# Patient Record
Sex: Male | Born: 1955
Health system: Southern US, Community
[De-identification: ages and names within clinical notes are randomized; demographics above are authoritative.]

## PROBLEM LIST (undated history)

## (undated) DIAGNOSIS — E7800 Pure hypercholesterolemia, unspecified: Secondary | ICD-10-CM

## (undated) DIAGNOSIS — Z8639 Personal history of other endocrine, nutritional and metabolic disease: Secondary | ICD-10-CM

## (undated) DIAGNOSIS — M5432 Sciatica, left side: Secondary | ICD-10-CM

## (undated) DIAGNOSIS — I10 Essential (primary) hypertension: Secondary | ICD-10-CM

## (undated) DIAGNOSIS — G4730 Sleep apnea, unspecified: Secondary | ICD-10-CM

## (undated) DIAGNOSIS — E119 Type 2 diabetes mellitus without complications: Secondary | ICD-10-CM

## (undated) DIAGNOSIS — I2510 Atherosclerotic heart disease of native coronary artery without angina pectoris: Secondary | ICD-10-CM

## (undated) DIAGNOSIS — I480 Paroxysmal atrial fibrillation: Secondary | ICD-10-CM

## (undated) DIAGNOSIS — Z21 Asymptomatic human immunodeficiency virus [HIV] infection status: Secondary | ICD-10-CM

## (undated) DIAGNOSIS — K625 Hemorrhage of anus and rectum: Secondary | ICD-10-CM

## (undated) DIAGNOSIS — I209 Angina pectoris, unspecified: Secondary | ICD-10-CM

## (undated) DIAGNOSIS — R011 Cardiac murmur, unspecified: Secondary | ICD-10-CM

## (undated) DIAGNOSIS — Z9114 Patient's other noncompliance with medication regimen: Secondary | ICD-10-CM

## (undated) DIAGNOSIS — Z9289 Personal history of other medical treatment: Secondary | ICD-10-CM

## (undated) DIAGNOSIS — C419 Malignant neoplasm of bone and articular cartilage, unspecified: Secondary | ICD-10-CM

## (undated) DIAGNOSIS — Z8719 Personal history of other diseases of the digestive system: Secondary | ICD-10-CM

## (undated) DIAGNOSIS — IMO0001 Reserved for inherently not codable concepts without codable children: Secondary | ICD-10-CM

## (undated) DIAGNOSIS — K819 Cholecystitis, unspecified: Secondary | ICD-10-CM

## (undated) DIAGNOSIS — I509 Heart failure, unspecified: Secondary | ICD-10-CM

## (undated) DIAGNOSIS — I059 Rheumatic mitral valve disease, unspecified: Secondary | ICD-10-CM

## (undated) DIAGNOSIS — F1011 Alcohol abuse, in remission: Secondary | ICD-10-CM

## (undated) DIAGNOSIS — Z86010 Personal history of colonic polyps: Secondary | ICD-10-CM

## (undated) DIAGNOSIS — F419 Anxiety disorder, unspecified: Secondary | ICD-10-CM

## (undated) DIAGNOSIS — B20 Human immunodeficiency virus [HIV] disease: Secondary | ICD-10-CM

## (undated) DIAGNOSIS — I499 Cardiac arrhythmia, unspecified: Secondary | ICD-10-CM

## (undated) DIAGNOSIS — Z8679 Personal history of other diseases of the circulatory system: Secondary | ICD-10-CM

## (undated) DIAGNOSIS — Z91148 Patient's other noncompliance with medication regimen for other reason: Secondary | ICD-10-CM

## (undated) DIAGNOSIS — B181 Chronic viral hepatitis B without delta-agent: Secondary | ICD-10-CM

## (undated) DIAGNOSIS — Z862 Personal history of diseases of the blood and blood-forming organs and certain disorders involving the immune mechanism: Secondary | ICD-10-CM

## (undated) HISTORY — DX: Patient's other noncompliance with medication regimen for other reason: Z91.148

## (undated) HISTORY — DX: Asymptomatic human immunodeficiency virus (hiv) infection status: Z21

## (undated) HISTORY — DX: Personal history of other medical treatment: Z92.89

## (undated) HISTORY — DX: Heart failure, unspecified: I50.9

## (undated) HISTORY — PX: A FLUTTER ABLATION: SHX5348

## (undated) HISTORY — DX: Cardiac arrhythmia, unspecified: I49.9

## (undated) HISTORY — DX: Paroxysmal atrial fibrillation: I48.0

## (undated) HISTORY — DX: Patient's other noncompliance with medication regimen: Z91.14

## (undated) HISTORY — DX: Human immunodeficiency virus (HIV) disease: B20

## (undated) HISTORY — DX: Alcohol abuse, in remission: F10.11

---
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
## 2000-07-05 ENCOUNTER — Inpatient Hospital Stay (HOSPITAL_COMMUNITY): Admission: EM | Admit: 2000-07-05 | Discharge: 2000-07-09 | Payer: Self-pay | Admitting: Emergency Medicine

## 2000-07-05 ENCOUNTER — Encounter: Payer: Self-pay | Admitting: Emergency Medicine

## 2001-05-12 ENCOUNTER — Ambulatory Visit (HOSPITAL_COMMUNITY): Admission: RE | Admit: 2001-05-12 | Discharge: 2001-05-12 | Payer: Self-pay | Admitting: Gastroenterology

## 2001-05-12 ENCOUNTER — Encounter (INDEPENDENT_AMBULATORY_CARE_PROVIDER_SITE_OTHER): Payer: Self-pay

## 2001-05-12 DIAGNOSIS — Z8719 Personal history of other diseases of the digestive system: Secondary | ICD-10-CM | POA: Insufficient documentation

## 2001-12-04 ENCOUNTER — Encounter: Payer: Self-pay | Admitting: Cardiology

## 2001-12-04 ENCOUNTER — Ambulatory Visit (HOSPITAL_COMMUNITY): Admission: RE | Admit: 2001-12-04 | Discharge: 2001-12-04 | Payer: Self-pay | Admitting: Cardiology

## 2001-12-05 ENCOUNTER — Ambulatory Visit (HOSPITAL_COMMUNITY): Admission: RE | Admit: 2001-12-05 | Discharge: 2001-12-05 | Payer: Self-pay | Admitting: Cardiology

## 2001-12-05 HISTORY — PX: CORONARY ANGIOPLASTY: SHX604

## 2003-11-03 ENCOUNTER — Encounter: Admission: RE | Admit: 2003-11-03 | Discharge: 2003-11-03 | Payer: Self-pay | Admitting: Internal Medicine

## 2003-11-04 ENCOUNTER — Emergency Department (HOSPITAL_COMMUNITY): Admission: EM | Admit: 2003-11-04 | Discharge: 2003-11-04 | Payer: Self-pay | Admitting: Emergency Medicine

## 2003-11-09 ENCOUNTER — Ambulatory Visit (HOSPITAL_COMMUNITY): Admission: RE | Admit: 2003-11-09 | Discharge: 2003-11-09 | Payer: Self-pay | Admitting: Cardiovascular Disease

## 2004-03-06 ENCOUNTER — Emergency Department (HOSPITAL_COMMUNITY): Admission: EM | Admit: 2004-03-06 | Discharge: 2004-03-07 | Payer: Self-pay | Admitting: Emergency Medicine

## 2004-03-08 ENCOUNTER — Emergency Department (HOSPITAL_COMMUNITY): Admission: EM | Admit: 2004-03-08 | Discharge: 2004-03-08 | Payer: Self-pay | Admitting: Emergency Medicine

## 2004-08-30 ENCOUNTER — Ambulatory Visit: Payer: Self-pay | Admitting: Cardiology

## 2004-09-04 ENCOUNTER — Ambulatory Visit: Payer: Self-pay | Admitting: Internal Medicine

## 2004-09-18 ENCOUNTER — Ambulatory Visit: Payer: Self-pay | Admitting: Cardiology

## 2004-09-26 ENCOUNTER — Ambulatory Visit: Payer: Self-pay | Admitting: Internal Medicine

## 2005-01-16 ENCOUNTER — Ambulatory Visit: Payer: Self-pay | Admitting: Internal Medicine

## 2005-02-15 ENCOUNTER — Ambulatory Visit: Payer: Self-pay | Admitting: Internal Medicine

## 2005-03-29 ENCOUNTER — Ambulatory Visit: Payer: Self-pay | Admitting: Internal Medicine

## 2005-05-17 ENCOUNTER — Ambulatory Visit: Payer: Self-pay | Admitting: Cardiology

## 2005-05-28 ENCOUNTER — Ambulatory Visit: Payer: Self-pay | Admitting: Internal Medicine

## 2005-06-11 ENCOUNTER — Ambulatory Visit: Payer: Self-pay

## 2005-08-13 DIAGNOSIS — Z8679 Personal history of other diseases of the circulatory system: Secondary | ICD-10-CM

## 2005-08-13 HISTORY — DX: Personal history of other diseases of the circulatory system: Z86.79

## 2005-09-18 ENCOUNTER — Ambulatory Visit: Payer: Self-pay | Admitting: Internal Medicine

## 2005-09-30 ENCOUNTER — Emergency Department (HOSPITAL_COMMUNITY): Admission: EM | Admit: 2005-09-30 | Discharge: 2005-09-30 | Payer: Self-pay | Admitting: Emergency Medicine

## 2005-10-02 ENCOUNTER — Ambulatory Visit: Payer: Self-pay | Admitting: Cardiology

## 2005-10-17 ENCOUNTER — Ambulatory Visit: Payer: Self-pay | Admitting: Internal Medicine

## 2005-10-18 ENCOUNTER — Ambulatory Visit: Payer: Self-pay | Admitting: Cardiology

## 2005-10-29 ENCOUNTER — Ambulatory Visit: Payer: Self-pay | Admitting: Internal Medicine

## 2005-11-06 ENCOUNTER — Ambulatory Visit: Payer: Self-pay | Admitting: Cardiology

## 2005-11-06 ENCOUNTER — Encounter: Payer: Self-pay | Admitting: Emergency Medicine

## 2005-11-06 ENCOUNTER — Inpatient Hospital Stay (HOSPITAL_COMMUNITY): Admission: AD | Admit: 2005-11-06 | Discharge: 2005-11-08 | Payer: Self-pay | Admitting: Cardiology

## 2005-11-13 ENCOUNTER — Ambulatory Visit: Payer: Self-pay | Admitting: Cardiology

## 2005-11-21 ENCOUNTER — Ambulatory Visit: Payer: Self-pay | Admitting: *Deleted

## 2005-12-07 ENCOUNTER — Ambulatory Visit: Payer: Self-pay | Admitting: Cardiology

## 2006-01-03 ENCOUNTER — Ambulatory Visit: Payer: Self-pay | Admitting: Cardiology

## 2006-01-08 ENCOUNTER — Ambulatory Visit: Payer: Self-pay | Admitting: Internal Medicine

## 2006-01-29 ENCOUNTER — Ambulatory Visit: Payer: Self-pay | Admitting: Internal Medicine

## 2006-01-30 ENCOUNTER — Ambulatory Visit (HOSPITAL_COMMUNITY): Admission: RE | Admit: 2006-01-30 | Discharge: 2006-01-30 | Payer: Self-pay | Admitting: Internal Medicine

## 2006-02-01 ENCOUNTER — Ambulatory Visit: Payer: Self-pay | Admitting: Internal Medicine

## 2006-02-01 ENCOUNTER — Observation Stay (HOSPITAL_COMMUNITY): Admission: RE | Admit: 2006-02-01 | Discharge: 2006-02-01 | Payer: Self-pay | Admitting: Internal Medicine

## 2006-07-03 ENCOUNTER — Ambulatory Visit: Payer: Self-pay | Admitting: Cardiology

## 2006-07-24 ENCOUNTER — Ambulatory Visit: Payer: Self-pay | Admitting: Internal Medicine

## 2006-12-17 ENCOUNTER — Ambulatory Visit: Payer: Self-pay | Admitting: Cardiology

## 2006-12-17 ENCOUNTER — Inpatient Hospital Stay (HOSPITAL_COMMUNITY): Admission: EM | Admit: 2006-12-17 | Discharge: 2006-12-22 | Payer: Self-pay | Admitting: Emergency Medicine

## 2006-12-18 ENCOUNTER — Encounter: Payer: Self-pay | Admitting: Cardiology

## 2006-12-20 ENCOUNTER — Encounter: Payer: Self-pay | Admitting: Cardiology

## 2006-12-30 DIAGNOSIS — Z860100 Personal history of colon polyps, unspecified: Secondary | ICD-10-CM | POA: Insufficient documentation

## 2006-12-30 DIAGNOSIS — Z862 Personal history of diseases of the blood and blood-forming organs and certain disorders involving the immune mechanism: Secondary | ICD-10-CM

## 2006-12-30 DIAGNOSIS — I4892 Unspecified atrial flutter: Secondary | ICD-10-CM | POA: Insufficient documentation

## 2006-12-30 DIAGNOSIS — I509 Heart failure, unspecified: Secondary | ICD-10-CM | POA: Insufficient documentation

## 2006-12-30 DIAGNOSIS — I059 Rheumatic mitral valve disease, unspecified: Secondary | ICD-10-CM

## 2006-12-30 DIAGNOSIS — B181 Chronic viral hepatitis B without delta-agent: Secondary | ICD-10-CM | POA: Insufficient documentation

## 2006-12-30 DIAGNOSIS — Z8639 Personal history of other endocrine, nutritional and metabolic disease: Secondary | ICD-10-CM

## 2006-12-30 DIAGNOSIS — D169 Benign neoplasm of bone and articular cartilage, unspecified: Secondary | ICD-10-CM | POA: Insufficient documentation

## 2006-12-30 HISTORY — DX: Personal history of colonic polyps: Z86.010

## 2006-12-30 HISTORY — DX: Chronic viral hepatitis B without delta-agent: B18.1

## 2006-12-30 HISTORY — DX: Personal history of diseases of the blood and blood-forming organs and certain disorders involving the immune mechanism: Z86.39

## 2006-12-30 HISTORY — DX: Rheumatic mitral valve disease, unspecified: I05.9

## 2006-12-30 HISTORY — DX: Personal history of diseases of the blood and blood-forming organs and certain disorders involving the immune mechanism: Z86.2

## 2006-12-30 HISTORY — DX: Personal history of colon polyps, unspecified: Z86.0100

## 2007-01-11 ENCOUNTER — Emergency Department (HOSPITAL_COMMUNITY): Admission: EM | Admit: 2007-01-11 | Discharge: 2007-01-11 | Payer: Self-pay | Admitting: Emergency Medicine

## 2007-01-16 ENCOUNTER — Ambulatory Visit: Payer: Self-pay | Admitting: Cardiovascular Disease

## 2007-01-16 ENCOUNTER — Ambulatory Visit: Payer: Self-pay | Admitting: Internal Medicine

## 2007-01-24 ENCOUNTER — Ambulatory Visit: Payer: Self-pay | Admitting: Cardiovascular Disease

## 2007-02-03 ENCOUNTER — Ambulatory Visit: Payer: Self-pay | Admitting: Internal Medicine

## 2007-02-03 ENCOUNTER — Ambulatory Visit: Payer: Self-pay | Admitting: Cardiovascular Disease

## 2007-02-03 LAB — CONVERTED CEMR LAB
BUN: 10 mg/dL (ref 6–23)
Basophils Absolute: 0 10*3/uL (ref 0.0–0.1)
Basophils Relative: 0.6 % (ref 0.0–1.0)
Calcium: 9.3 mg/dL (ref 8.4–10.5)
Eosinophils Absolute: 0.2 10*3/uL (ref 0.0–0.6)
Eosinophils Relative: 2.7 % (ref 0.0–5.0)
GFR calc Af Amer: 75 mL/min
INR: 3.9 — ABNORMAL HIGH (ref 0.9–2.0)
MCHC: 32.8 g/dL (ref 30.0–36.0)
Magnesium: 2.2 mg/dL (ref 1.5–2.5)
Monocytes Absolute: 0.6 10*3/uL (ref 0.2–0.7)
Monocytes Relative: 8.5 % (ref 3.0–11.0)
Neutro Abs: 3.5 10*3/uL (ref 1.4–7.7)
Neutrophils Relative %: 46.5 % (ref 43.0–77.0)
RDW: 14.5 % (ref 11.5–14.6)

## 2007-02-07 ENCOUNTER — Ambulatory Visit (HOSPITAL_COMMUNITY): Admission: RE | Admit: 2007-02-07 | Discharge: 2007-02-07 | Payer: Self-pay | Admitting: Internal Medicine

## 2007-02-07 ENCOUNTER — Ambulatory Visit: Payer: Self-pay | Admitting: Internal Medicine

## 2007-02-10 ENCOUNTER — Ambulatory Visit: Payer: Self-pay | Admitting: Cardiology

## 2007-03-25 ENCOUNTER — Ambulatory Visit: Payer: Self-pay | Admitting: Cardiology

## 2007-04-07 ENCOUNTER — Ambulatory Visit: Payer: Self-pay | Admitting: Cardiology

## 2007-04-21 ENCOUNTER — Ambulatory Visit: Payer: Self-pay | Admitting: Cardiology

## 2007-06-13 ENCOUNTER — Encounter (INDEPENDENT_AMBULATORY_CARE_PROVIDER_SITE_OTHER): Payer: Self-pay | Admitting: *Deleted

## 2007-06-26 ENCOUNTER — Ambulatory Visit: Payer: Self-pay | Admitting: Cardiology

## 2007-07-17 ENCOUNTER — Ambulatory Visit: Payer: Self-pay | Admitting: Internal Medicine

## 2007-09-22 ENCOUNTER — Ambulatory Visit: Payer: Self-pay | Admitting: Internal Medicine

## 2007-10-10 ENCOUNTER — Ambulatory Visit: Payer: Self-pay | Admitting: Cardiology

## 2007-10-24 ENCOUNTER — Ambulatory Visit: Payer: Self-pay | Admitting: Internal Medicine

## 2007-11-14 ENCOUNTER — Ambulatory Visit: Payer: Self-pay | Admitting: Internal Medicine

## 2008-02-16 ENCOUNTER — Ambulatory Visit: Payer: Self-pay | Admitting: Internal Medicine

## 2008-04-08 ENCOUNTER — Telehealth (INDEPENDENT_AMBULATORY_CARE_PROVIDER_SITE_OTHER): Payer: Self-pay | Admitting: *Deleted

## 2008-04-09 ENCOUNTER — Ambulatory Visit: Payer: Self-pay | Admitting: Internal Medicine

## 2008-04-09 ENCOUNTER — Encounter (INDEPENDENT_AMBULATORY_CARE_PROVIDER_SITE_OTHER): Payer: Self-pay | Admitting: *Deleted

## 2008-04-09 ENCOUNTER — Ambulatory Visit: Payer: Self-pay | Admitting: Cardiology

## 2008-05-04 ENCOUNTER — Ambulatory Visit: Payer: Self-pay | Admitting: Internal Medicine

## 2008-05-04 LAB — CONVERTED CEMR LAB
BUN: 11 mg/dL (ref 6–23)
CO2: 32 meq/L (ref 19–32)
Calcium: 9.4 mg/dL (ref 8.4–10.5)
Chloride: 107 meq/L (ref 96–112)
Creatinine, Ser: 1.2 mg/dL (ref 0.4–1.5)
GFR calc Af Amer: 81 mL/min
GFR calc non Af Amer: 67 mL/min
Glucose, Bld: 129 mg/dL — ABNORMAL HIGH (ref 70–99)
Potassium: 4.6 meq/L (ref 3.5–5.1)
Sed Rate: 32 mm/hr — ABNORMAL HIGH (ref 0–16)

## 2008-07-09 ENCOUNTER — Ambulatory Visit: Payer: Self-pay | Admitting: Internal Medicine

## 2008-07-23 ENCOUNTER — Ambulatory Visit: Payer: Self-pay | Admitting: Internal Medicine

## 2008-07-23 ENCOUNTER — Ambulatory Visit: Payer: Self-pay | Admitting: Cardiology

## 2008-08-26 ENCOUNTER — Ambulatory Visit: Payer: Self-pay | Admitting: Internal Medicine

## 2008-08-26 ENCOUNTER — Inpatient Hospital Stay (HOSPITAL_COMMUNITY): Admission: AD | Admit: 2008-08-26 | Discharge: 2008-08-29 | Payer: Self-pay | Admitting: Internal Medicine

## 2008-11-22 ENCOUNTER — Ambulatory Visit: Payer: Self-pay | Admitting: Internal Medicine

## 2008-12-27 ENCOUNTER — Ambulatory Visit: Payer: Self-pay | Admitting: Internal Medicine

## 2009-01-24 ENCOUNTER — Ambulatory Visit: Payer: Self-pay | Admitting: Cardiology

## 2009-01-24 LAB — CONVERTED CEMR LAB: POC INR: 1.4

## 2009-02-16 ENCOUNTER — Encounter: Payer: Self-pay | Admitting: *Deleted

## 2009-03-06 ENCOUNTER — Ambulatory Visit: Payer: Self-pay | Admitting: Internal Medicine

## 2009-03-06 ENCOUNTER — Inpatient Hospital Stay (HOSPITAL_COMMUNITY): Admission: EM | Admit: 2009-03-06 | Discharge: 2009-03-09 | Payer: Self-pay | Admitting: Emergency Medicine

## 2009-03-18 ENCOUNTER — Telehealth: Payer: Self-pay | Admitting: Internal Medicine

## 2009-03-21 ENCOUNTER — Encounter (INDEPENDENT_AMBULATORY_CARE_PROVIDER_SITE_OTHER): Payer: Self-pay | Admitting: *Deleted

## 2009-03-23 ENCOUNTER — Encounter (INDEPENDENT_AMBULATORY_CARE_PROVIDER_SITE_OTHER): Payer: Self-pay | Admitting: *Deleted

## 2009-03-23 ENCOUNTER — Encounter: Payer: Self-pay | Admitting: Cardiology

## 2009-04-07 ENCOUNTER — Ambulatory Visit: Payer: Self-pay | Admitting: Nurse Practitioner

## 2009-04-08 ENCOUNTER — Encounter: Payer: Self-pay | Admitting: Nurse Practitioner

## 2009-04-08 LAB — CONVERTED CEMR LAB
ALT: 16 units/L (ref 0–53)
Basophils Relative: 1 % (ref 0–1)
Chloride: 100 meq/L (ref 96–112)
Digitoxin Lvl: 0.5 ng/mL — ABNORMAL LOW (ref 0.8–2.0)
Eosinophils Absolute: 0.2 10*3/uL (ref 0.0–0.7)
Eosinophils Relative: 5 % (ref 0–5)
Glucose, Bld: 91 mg/dL (ref 70–99)
Hemoglobin: 13.6 g/dL (ref 13.0–17.0)
INR: 1.5 (ref 0.0–1.5)
MCHC: 33 g/dL (ref 30.0–36.0)
MCV: 80 fL (ref 78.0–100.0)
Neutro Abs: 2.2 10*3/uL (ref 1.7–7.7)
Platelets: 179 10*3/uL (ref 150–400)
Prothrombin Time: 17.5 s — ABNORMAL HIGH (ref 11.6–15.2)
RBC: 5.15 M/uL (ref 4.22–5.81)
RDW: 14.5 % (ref 11.5–15.5)
Sodium: 138 meq/L (ref 135–145)
TSH: 1.447 microintl units/mL (ref 0.350–4.500)
WBC: 5.3 10*3/uL (ref 4.0–10.5)

## 2009-04-26 ENCOUNTER — Ambulatory Visit: Payer: Self-pay | Admitting: Nurse Practitioner

## 2009-04-28 ENCOUNTER — Ambulatory Visit: Payer: Self-pay | Admitting: Internal Medicine

## 2009-04-29 ENCOUNTER — Encounter (INDEPENDENT_AMBULATORY_CARE_PROVIDER_SITE_OTHER): Payer: Self-pay | Admitting: *Deleted

## 2009-05-05 ENCOUNTER — Ambulatory Visit: Payer: Self-pay | Admitting: Nurse Practitioner

## 2009-05-05 DIAGNOSIS — M109 Gout, unspecified: Secondary | ICD-10-CM | POA: Insufficient documentation

## 2009-05-06 ENCOUNTER — Telehealth (INDEPENDENT_AMBULATORY_CARE_PROVIDER_SITE_OTHER): Payer: Self-pay | Admitting: Nurse Practitioner

## 2009-05-09 ENCOUNTER — Encounter: Payer: Self-pay | Admitting: Cardiology

## 2009-05-09 ENCOUNTER — Ambulatory Visit: Payer: Self-pay | Admitting: *Deleted

## 2009-05-09 ENCOUNTER — Telehealth: Payer: Self-pay | Admitting: Cardiology

## 2009-05-09 LAB — CONVERTED CEMR LAB: Prothrombin Time: 19.8 s

## 2009-05-10 ENCOUNTER — Ambulatory Visit: Payer: Self-pay | Admitting: Nurse Practitioner

## 2009-05-11 ENCOUNTER — Encounter (INDEPENDENT_AMBULATORY_CARE_PROVIDER_SITE_OTHER): Payer: Self-pay | Admitting: Nurse Practitioner

## 2009-05-30 ENCOUNTER — Encounter (INDEPENDENT_AMBULATORY_CARE_PROVIDER_SITE_OTHER): Payer: Self-pay | Admitting: Nurse Practitioner

## 2009-05-30 ENCOUNTER — Telehealth (INDEPENDENT_AMBULATORY_CARE_PROVIDER_SITE_OTHER): Payer: Self-pay | Admitting: Nurse Practitioner

## 2009-06-02 ENCOUNTER — Ambulatory Visit: Payer: Self-pay | Admitting: Cardiovascular Disease

## 2009-06-16 ENCOUNTER — Encounter (INDEPENDENT_AMBULATORY_CARE_PROVIDER_SITE_OTHER): Payer: Self-pay | Admitting: *Deleted

## 2009-08-06 ENCOUNTER — Emergency Department (HOSPITAL_COMMUNITY): Admission: EM | Admit: 2009-08-06 | Discharge: 2009-08-06 | Payer: Self-pay | Admitting: Emergency Medicine

## 2009-09-02 ENCOUNTER — Ambulatory Visit: Payer: Self-pay | Admitting: Cardiovascular Disease

## 2009-09-02 LAB — CONVERTED CEMR LAB: POC INR: 1.9

## 2009-09-23 ENCOUNTER — Ambulatory Visit: Payer: Self-pay | Admitting: Internal Medicine

## 2009-12-05 ENCOUNTER — Emergency Department (HOSPITAL_COMMUNITY): Admission: EM | Admit: 2009-12-05 | Discharge: 2009-12-05 | Payer: Self-pay | Admitting: Emergency Medicine

## 2009-12-05 ENCOUNTER — Telehealth (INDEPENDENT_AMBULATORY_CARE_PROVIDER_SITE_OTHER): Payer: Self-pay | Admitting: Nurse Practitioner

## 2009-12-28 ENCOUNTER — Ambulatory Visit: Payer: Self-pay | Admitting: Nurse Practitioner

## 2009-12-29 LAB — CONVERTED CEMR LAB
ALT: 12 units/L (ref 0–53)
Alkaline Phosphatase: 69 units/L (ref 39–117)
Basophils Relative: 0 % (ref 0–1)
Calcium: 9.2 mg/dL (ref 8.4–10.5)
Chloride: 102 meq/L (ref 96–112)
Creatinine, Ser: 1.22 mg/dL (ref 0.40–1.50)
Eosinophils Absolute: 0.2 10*3/uL (ref 0.0–0.7)
Eosinophils Relative: 4 % (ref 0–5)
HCT: 44.7 % (ref 39.0–52.0)
Hemoglobin: 15.2 g/dL (ref 13.0–17.0)
INR: 1.74 — ABNORMAL HIGH (ref <1.50)
Lymphocytes Relative: 44 % (ref 12–46)
Lymphs Abs: 2.5 10*3/uL (ref 0.7–4.0)
MCV: 80.7 fL (ref 78.0–100.0)
Monocytes Absolute: 0.4 10*3/uL (ref 0.1–1.0)
Monocytes Relative: 7 % (ref 3–12)
Neutro Abs: 2.6 10*3/uL (ref 1.7–7.7)
Neutrophils Relative %: 45 % (ref 43–77)
Platelets: 223 10*3/uL (ref 150–400)
Potassium: 3.7 meq/L (ref 3.5–5.3)
RBC: 5.54 M/uL (ref 4.22–5.81)
RDW: 14.2 % (ref 11.5–15.5)
Sodium: 141 meq/L (ref 135–145)
Total Bilirubin: 0.5 mg/dL (ref 0.3–1.2)
Total Protein: 8.2 g/dL (ref 6.0–8.3)
Uric Acid, Serum: 6.4 mg/dL (ref 4.0–7.8)

## 2009-12-30 ENCOUNTER — Ambulatory Visit: Payer: Self-pay | Admitting: Internal Medicine

## 2009-12-30 ENCOUNTER — Encounter (INDEPENDENT_AMBULATORY_CARE_PROVIDER_SITE_OTHER): Payer: Self-pay | Admitting: Nurse Practitioner

## 2010-01-03 ENCOUNTER — Encounter (INDEPENDENT_AMBULATORY_CARE_PROVIDER_SITE_OTHER): Payer: Self-pay | Admitting: Nurse Practitioner

## 2010-01-03 LAB — CONVERTED CEMR LAB
Digitoxin Lvl: 0.5 ng/mL — ABNORMAL LOW (ref 0.8–2.0)
Hgb A1c MFr Bld: 7.6 % — ABNORMAL HIGH (ref <5.7)

## 2010-01-04 ENCOUNTER — Encounter (INDEPENDENT_AMBULATORY_CARE_PROVIDER_SITE_OTHER): Payer: Self-pay | Admitting: Nurse Practitioner

## 2010-01-10 ENCOUNTER — Telehealth (INDEPENDENT_AMBULATORY_CARE_PROVIDER_SITE_OTHER): Payer: Self-pay | Admitting: Nurse Practitioner

## 2010-01-13 ENCOUNTER — Ambulatory Visit: Payer: Self-pay | Admitting: Cardiovascular Disease

## 2010-01-30 ENCOUNTER — Emergency Department (HOSPITAL_COMMUNITY): Admission: EM | Admit: 2010-01-30 | Discharge: 2010-01-30 | Payer: Self-pay | Admitting: Emergency Medicine

## 2010-02-20 ENCOUNTER — Ambulatory Visit: Payer: Self-pay | Admitting: Internal Medicine

## 2010-03-06 ENCOUNTER — Ambulatory Visit: Payer: Self-pay | Admitting: Cardiology

## 2010-03-30 ENCOUNTER — Ambulatory Visit: Payer: Self-pay | Admitting: Nurse Practitioner

## 2010-03-30 LAB — CONVERTED CEMR LAB
Blood Glucose, Fingerstick: 159
Microalb, Ur: 1.52 mg/dL (ref 0.00–1.89)
Nitrite: NEGATIVE
PSA: 0.91 ng/mL (ref 0.10–4.00)
Protein, U semiquant: NEGATIVE
Urobilinogen, UA: 0.2

## 2010-03-31 ENCOUNTER — Encounter (INDEPENDENT_AMBULATORY_CARE_PROVIDER_SITE_OTHER): Payer: Self-pay | Admitting: Nurse Practitioner

## 2010-03-31 ENCOUNTER — Ambulatory Visit: Payer: Self-pay | Admitting: Cardiology

## 2010-04-05 ENCOUNTER — Emergency Department (HOSPITAL_COMMUNITY): Admission: EM | Admit: 2010-04-05 | Discharge: 2010-04-05 | Payer: Self-pay | Admitting: Emergency Medicine

## 2010-04-14 ENCOUNTER — Ambulatory Visit: Payer: Self-pay | Admitting: Cardiology

## 2010-05-15 ENCOUNTER — Ambulatory Visit: Payer: Self-pay | Admitting: Internal Medicine

## 2010-05-29 ENCOUNTER — Ambulatory Visit: Payer: Self-pay | Admitting: Cardiology

## 2010-06-08 ENCOUNTER — Ambulatory Visit: Payer: Self-pay | Admitting: Vascular Surgery

## 2010-06-08 ENCOUNTER — Encounter (INDEPENDENT_AMBULATORY_CARE_PROVIDER_SITE_OTHER): Payer: Self-pay | Admitting: Internal Medicine

## 2010-06-08 ENCOUNTER — Inpatient Hospital Stay (HOSPITAL_COMMUNITY): Admission: EM | Admit: 2010-06-08 | Discharge: 2010-06-09 | Payer: Self-pay | Admitting: Emergency Medicine

## 2010-06-26 ENCOUNTER — Ambulatory Visit: Payer: Self-pay | Admitting: Internal Medicine

## 2010-06-30 ENCOUNTER — Ambulatory Visit: Payer: Self-pay | Admitting: Nurse Practitioner

## 2010-06-30 DIAGNOSIS — K029 Dental caries, unspecified: Secondary | ICD-10-CM | POA: Insufficient documentation

## 2010-06-30 LAB — CONVERTED CEMR LAB
Blood Glucose, Fingerstick: 167
Hgb A1c MFr Bld: 7.1 %

## 2010-07-03 ENCOUNTER — Encounter (INDEPENDENT_AMBULATORY_CARE_PROVIDER_SITE_OTHER): Payer: Self-pay | Admitting: Nurse Practitioner

## 2010-07-10 ENCOUNTER — Ambulatory Visit: Payer: Self-pay | Admitting: Nurse Practitioner

## 2010-07-11 DIAGNOSIS — E7800 Pure hypercholesterolemia, unspecified: Secondary | ICD-10-CM

## 2010-07-11 HISTORY — DX: Pure hypercholesterolemia, unspecified: E78.00

## 2010-07-17 ENCOUNTER — Encounter (INDEPENDENT_AMBULATORY_CARE_PROVIDER_SITE_OTHER): Payer: Self-pay | Admitting: Internal Medicine

## 2010-07-17 LAB — CONVERTED CEMR LAB
ALT: 10 units/L (ref 0–53)
AST: 13 units/L (ref 0–37)
Albumin: 4.1 g/dL (ref 3.5–5.2)
Calcium: 9.7 mg/dL (ref 8.4–10.5)
Chloride: 102 meq/L (ref 96–112)
Creatinine, Ser: 1.36 mg/dL (ref 0.40–1.50)
Potassium: 4.2 meq/L (ref 3.5–5.3)
Sodium: 140 meq/L (ref 135–145)
Total CHOL/HDL Ratio: 4.7

## 2010-07-24 ENCOUNTER — Ambulatory Visit: Payer: Self-pay | Admitting: Cardiology

## 2010-08-24 ENCOUNTER — Telehealth (INDEPENDENT_AMBULATORY_CARE_PROVIDER_SITE_OTHER): Payer: Self-pay | Admitting: Nurse Practitioner

## 2010-08-24 ENCOUNTER — Other Ambulatory Visit: Payer: Self-pay | Admitting: Internal Medicine

## 2010-08-24 ENCOUNTER — Ambulatory Visit
Admission: RE | Admit: 2010-08-24 | Discharge: 2010-08-24 | Payer: Self-pay | Source: Home / Self Care | Attending: Internal Medicine | Admitting: Internal Medicine

## 2010-08-24 ENCOUNTER — Encounter: Payer: Self-pay | Admitting: Internal Medicine

## 2010-08-24 ENCOUNTER — Ambulatory Visit: Admission: RE | Admit: 2010-08-24 | Discharge: 2010-08-24 | Payer: Self-pay | Source: Home / Self Care

## 2010-08-24 LAB — BASIC METABOLIC PANEL
BUN: 18 mg/dL (ref 6–23)
CO2: 28 mEq/L (ref 19–32)
Calcium: 9.7 mg/dL (ref 8.4–10.5)
Chloride: 90 mEq/L — ABNORMAL LOW (ref 96–112)
Creatinine, Ser: 1.3 mg/dL (ref 0.4–1.5)
GFR: 73.33 mL/min (ref >60.00)
Glucose, Bld: 770 mg/dL (ref 70–99)
Potassium: 5.1 mEq/L (ref 3.5–5.1)
Sodium: 125 mEq/L — ABNORMAL LOW (ref 135–145)

## 2010-08-24 LAB — CONVERTED CEMR LAB: POC INR: 1.5

## 2010-08-31 ENCOUNTER — Encounter (INDEPENDENT_AMBULATORY_CARE_PROVIDER_SITE_OTHER): Payer: Self-pay | Admitting: Nurse Practitioner

## 2010-09-02 ENCOUNTER — Encounter: Payer: Self-pay | Admitting: Internal Medicine

## 2010-09-07 ENCOUNTER — Ambulatory Visit: Admit: 2010-09-07 | Payer: Self-pay

## 2010-09-10 LAB — CONVERTED CEMR LAB
Blood Glucose, Fingerstick: 131
Cholesterol: 201 mg/dL — ABNORMAL HIGH (ref 0–200)
HDL: 50 mg/dL (ref >39)
INR: 1.7 — ABNORMAL HIGH (ref 0.0–1.5)
Triglycerides: 90 mg/dL (ref <150)
VLDL: 18 mg/dL (ref 0–40)

## 2010-09-12 NOTE — Medication Information (Signed)
Summary: rov/tm  Anticoagulant Therapy  Managed by: Weston Brass, PharmD Supervising MD: Tenny Craw MD, Gunnar Fusi Indication 1: Atrial Fibrillation (chronic) Lab Used: [facility] Site: [facility] INR POC 2.3 INR RANGE 2.0-3.0  Dietary changes: no    Health status changes: no    Bleeding/hemorrhagic complications: no    Recent/future hospitalizations: no    Any changes in medication regimen? yes       Details: had injection for gout last week; on colchicine and increased allopurinol  Recent/future dental: no  Any missed doses?: no       Is patient compliant with meds? yes       Allergies: 1)  ! * Some Kind of Antibx  Anticoagulation Management History:      The patient is taking warfarin and comes in today for a routine follow up visit.  Positive risk factors for bleeding include presence of serious comorbidities.  Negative risk factors for bleeding include an age less than 66 years old, no history of CVA/TIA, and no history of GI bleeding.  The bleeding index is 'intermediate risk'.  Positive CHADS2 values include History of CHF, History of HTN, and History of Diabetes.  Negative CHADS2 values include Age > 89 years old and Prior Stroke/CVA/TIA.  His last INR was 1.74.  Anticoagulation responsible provider: Tenny Craw MD, Gunnar Fusi.  INR POC: 2.3.  Cuvette Lot#: [id number].  Exp: 03/2011.    Anticoagulation Management Assessment/Plan:      The patient's current anticoagulation dose is Warfarin sodium 2.5 mg tabs: Take as directed by coumadin clinic., Warfarin sodium 5 mg tabs: Take as directed by coumadin clinic..  The target INR is 2 - 3.  The next INR is due 01/13/2010.  Anticoagulation instructions were given to patient.  Results were reviewed/authorized by Weston Brass, PharmD.  He was notified by Weston Brass PharmD.         Prior Anticoagulation Instructions: INR 1.3 Today that 7.5mg s and Saturday take 5mg s then resume 5mg  everyday except 2.5mg s on Tuesdays, Thursdays and Saturdays.  Recheck in 10 days.   Current Anticoagulation Instructions: INR 2.3  Continue same dose of 5mg  every day except 2.5mg  on Tuesday, Thursday and Saturday  Prescriptions: WARFARIN SODIUM 5 MG TABS (WARFARIN SODIUM) Take as directed by coumadin clinic.  #20 x 0   Entered by:   Weston Brass PharmD   Authorized by:   Laren Boom, MD, [facility]   Signed by:   Weston Brass PharmD on 12/30/2009   Method used:   Electronically to        [facility] (retail)       [address]       [place], [place]  [address]       Ph: [phone]       Fax: [phone]   RxID:   [id number] WARFARIN SODIUM 2.5 MG TABS (WARFARIN SODIUM) Take as directed by coumadin clinic.  #20 x 0   Entered by:   Weston Brass PharmD   Authorized by:   Laren Boom, MD, [facility]   Signed by:   Weston Brass PharmD on 12/30/2009   Method used:   Electronically to        [facility] (retail)       [address]       [place], [place]  [address]       Ph: [phone]  Fax: [phone]   RxID:   [id number]

## 2010-09-12 NOTE — Letter (Signed)
Summary: *HSN Results Follow up  [facility]  [address] [place], [place] [address]   Phone: [phone]  Fax: [phone]      03/31/2010   [facility] [address] [place], [place]  [address]   Dear  Mr. Hancel Slaugh,                            ____S.Drinkard,FNP   ____D. Gore,FNP       ____B. McPherson,MD   ____V. Rankins,MD    ____E. Mulberry,MD    __X__N. Daphine Deutscher, FNP  ____D. Reche Dixon, MD    ____K. Philipp Deputy, MD    ____Other     This letter is to inform you that your recent test(s):  _______Pap Smear    ___X____Lab Test     _______X-ray    ___X____ is within acceptable limits  _______ requires a medication change  _______ requires a follow-up lab visit  _______ requires a follow-up visit with your provider   Comments: Labs done during recent office visit are normal.       _________________________________________________________ If you have any questions, please contact our office [phone].                    Sincerely,    Lehman Prom FNP [facility]

## 2010-09-12 NOTE — Medication Information (Signed)
Summary: [name] [name]  Anticoagulant Therapy  Managed by: Shelby Dubin, PharmD, BCPS, CPP Supervising MD: Eden Emms MD, Theron Arista Indication 1: Atrial Fibrillation (chronic) Lab Used: [facility] Site: [facility] INR POC 1.9 INR RANGE 2.0-3.0  Dietary changes: no    Health status changes: no    Bleeding/hemorrhagic complications: no    Recent/future hospitalizations: no    Any changes in medication regimen? no    Recent/future dental: no  Any missed doses?: yes     Details: missed about a week and 1/2 doses then went back on it 2 days ago  Is patient compliant with meds? yes      Comments: Patient took 7.5mg  on Wednesday, 5 mg on Thursday and Friday  Allergies (verified): 1)  ! * Some Kind of Antibx  Anticoagulation Management History:      The patient is taking warfarin and comes in today for a routine follow up visit.  Positive risk factors for bleeding include presence of serious comorbidities.  Negative risk factors for bleeding include an age less than 103 years old, no history of CVA/TIA, and no history of GI bleeding.  The bleeding index is 'intermediate risk'.  Positive CHADS2 values include History of CHF, History of HTN, and History of Diabetes.  Negative CHADS2 values include Age > 15 years old and Prior Stroke/CVA/TIA.  His last INR was 1.7.  Anticoagulation responsible provider: Eden Emms MD, Theron Arista.  INR POC: 1.9.  Cuvette Lot#: [id number].  Exp: 05/2010.    Anticoagulation Management Assessment/Plan:      The patient's current anticoagulation dose is Warfarin sodium 2.5 mg tabs: Take as directed by coumadin clinic., Warfarin sodium 5 mg tabs: Take as directed by coumadin clinic..  The target INR is 2 - 3.  The next INR is due 09/16/2009.  Anticoagulation instructions were given to patient.  Results were reviewed/authorized by Shelby Dubin, PharmD, BCPS, CPP.  He was notified by Ysidro Evert, Pharm D Candidate.         Prior Anticoagulation Instructions: INR  2.1  Continue current dosing regimen of 2.5 mg on Tuesday, Thursday, and Saturday, and 5 mg all other days.  Return to clinic in 3 weeks.  Current Anticoagulation Instructions: INR: 1.9 Take extra 2.5mg  today then resume to 5mg  tablet daily except 2.5mg  on Tuesdays, Thursdays, Saturdays Recheck in 2 weeeks Prescriptions: WARFARIN SODIUM 2.5 MG TABS (WARFARIN SODIUM) Take as directed by coumadin clinic.  #20 x 0   Entered by:   Cloyde Reams RN   Authorized by:   Laren Boom, MD, [facility]   Signed by:   Cloyde Reams RN on 09/02/2009   Method used:   Electronically to        [facility] (retail)       [address]       [place], [place]  [address]       Ph: [phone]       Fax: [phone]   RxID:   [id number] WARFARIN SODIUM 5 MG TABS (WARFARIN SODIUM) Take as directed by coumadin clinic.  #30 x 0   Entered by:   Cloyde Reams RN   Authorized by:   Laren Boom, MD, [facility]   Signed by:   Cloyde Reams RN on 09/02/2009   Method used:   Electronically to        [facility] (retail)       [address]  [place], [place]  [address]       Ph: [phone]       Fax: [phone]   RxID:   [id number]

## 2010-09-12 NOTE — Medication Information (Signed)
Summary: rov/sp  Anticoagulant Therapy  Managed by: Weston Brass, PharmD Supervising MD: Daleen Squibb MD, Maisie Fus Indication 1: Atrial Fibrillation (chronic) Lab Used: [facility] [place] Site: [address] INR POC 2.7 INR RANGE 2.0-3.0  Dietary changes: no     Bleeding/hemorrhagic complications: no    Recent/future hospitalizations: yes       Details: ER visit last week for stomach problems  Any changes in medication regimen? no    Recent/future dental: no  Any missed doses?: no         Allergies: 1)  ! * Some Kind of Antibx  Anticoagulation Management History:      Positive risk factors for bleeding include presence of serious comorbidities.  Negative risk factors for bleeding include an age less than 16 years old, no history of CVA/TIA, and no history of GI bleeding.  The bleeding index is 'intermediate risk'.  Positive CHADS2 values include History of CHF, History of HTN, and History of Diabetes.  Negative CHADS2 values include Age > 15 years old and Prior Stroke/CVA/TIA.  His last INR was 1.74.  Anticoagulation responsible provider: Daleen Squibb MD, Maisie Fus.  INR POC: 2.7.  Cuvette Lot#: [id number].  Exp: 05/2011.    Anticoagulation Management Assessment/Plan:      The patient's current anticoagulation dose is Warfarin sodium 5 mg tabs: Take as directed by coumadin clinic..  The target INR is 2 - 3.  The next INR is due 05/12/2010.  Anticoagulation instructions were given to patient.  Results were reviewed/authorized by Weston Brass, PharmD.  He was notified by Lyna Poser PharmD.         Prior Anticoagulation Instructions: INR 1.8  Today take one 5mg  and one 2.5mg  tablet, then resume 5 mg daily except 2.5 mg on Tue, Thu and Sat.  Return to clinic in 2 weeks.  Current Anticoagulation Instructions: INR 2.7 Continue taking 1 tablet of the 5 mg on sunday, monday, wednesday, and friday. Take 2.5 mg on tuesday, thursday, and saturday.

## 2010-09-12 NOTE — Assessment & Plan Note (Signed)
Summary: rov last seen 08/2009/sl  Medications Added POTASSIUM CHLORIDE CRYS CR 20 MEQ CR-TABS (POTASSIUM CHLORIDE CRYS CR) Pt.takes once or twice a month        Visit Type:  Follow-up  CC:  Pt is not taking potassium on regular basis. He takes once o twice a month.  History of Present Illness: [name] [name] returns today for additional followup.  He is a pleasant 55 yo man with a h/o mixed CM, paroxysmal atrial flutter and fibrillation, and HTN.  He admits to dietary indiscretion.  He has been bothered by Gout and has been diagnosed with DM.  He is still working as work is Clinical research associate.  He can do most of his job without much difficulty but notes some difficulty climbing up scaffolding.  He has had no syncope or near syncope or c/p.  His CHF is really class 1 at present.  Current Medications (verified): 1)  Allopurinol 100 Mg  Tabs (Allopurinol) .... 2 Tablets By Mouth Daily For Gout 2)  Colcrys 0.6 Mg Tabs (Colchicine) .... One Tablet By Mouth Three Times A Day As Needed For Gout Flare 3)  Digoxin 0.25 Mg Tabs (Digoxin) .... Take One Tablet By Mouth Daily 4)  Furosemide 40 Mg Tabs (Furosemide) .[name].. 1 and 1/2 Tablet By Mouth Two Times A Day 5)  Potassium Chloride Crys Cr 20 Meq Cr-Tabs (Potassium Chloride Crys Cr) .... Pt.takes Once or Twice A Month 6)  Metformin Hcl 500 Mg Tabs (Metformin Hcl) .[name].. 1 Tablet By Mouth Two Times A Day For Diabetes 7)  Lantus 100 Unit/ml Soln (Insulin Glargine) .... 25 Units Subcutaneously Nightly For Diabetes 8)  Atacand 16 Mg Tabs (Candesartan Cilexetil) .... One Tablet By Mouth Daily 9)  Carvedilol 25 Mg Tabs (Carvedilol) .... One Tablet By Mouth Two Times A Day 10)  Blood Glucose Test  Strp (Glucose Blood) .... Check Blood Sugar Twice Daily 11)  Warfarin Sodium 5 Mg Tabs (Warfarin Sodium) .... Take As Directed By Coumadin Clinic.  Allergies: 1)  ! * Some Kind of Antibx  Past History:  Past Medical History: Last updated: 07/23/2008 Colonic polyps, hx  of atrial flutter 2007 atrial fibrillation 2008 hypertension Cough on amiodarone with elevated ESR  Past Surgical History: Last updated: 07/23/2008 CATH(12/05/2001) STRESS TEST (2001 AND 2003) Catheter ablation of atrial flutter 2007 and 2008  Review of Systems  The patient denies chest pain, syncope, dyspnea on exertion, and peripheral edema.    Vital Signs:  Patient profile:   55 year old male Height:      68 inches Weight:      213.25 pounds BMI:     32.54 Pulse rate:   69 / minute Pulse rhythm:   regular Resp:     18 per minute BP sitting:   134 / 84  (left arm) Cuff size:   large  Vitals Entered By: Vikki Ports (February 20, 2010 10:51 AM)  Physical Exam  General:  Well developed, well nourished, in no acute distress.  HEENT: normal Neck: supple. No JVD. Carotids 2+ bilaterally no bruits Cor: RRR with a soft MR murmur and PMI is enlarged and laterally displaced. Lungs: CTA Ab: soft, nontender. nondistended. No HSM. Good bowel sounds Ext: warm. no cyanosis, clubbing or edema Neuro: alert and oriented. Grossly nonfocal. affect pleasant    EKG  Procedure date:  02/20/2010  Findings:      Normal sinus rhythm with rate of: 70.  Left ventricular hypertrophy.    Impression & Recommendations:  Problem #  1:  CHF (ICD-428.0) He has mixed systolic/diastolic CHF.  His symptoms remain class 1and he continues to work as a Scientist, water quality.  He admits to dietary indiscretion with sodium.  I have asked that he work on this.  He will continue his meds as below. The following medications were removed from the medication list:    Warfarin Sodium 2.5 Mg Tabs (Warfarin sodium) .[name]... Take as directed by coumadin clinic.    Hydrochlorothiazide 25 Mg Tabs (Hydrochlorothiazide) ..... Hold    Aspir-low 81 Mg Tbec (Aspirin) .[name]... 1 by mouth once daily His updated medication list for this problem includes:    Digoxin 0.25 Mg Tabs (Digoxin) .[name]... Take one tablet by mouth daily     Furosemide 40 Mg Tabs (Furosemide) .[name]... 1 and 1/2 tablet by mouth two times a day    Atacand 16 Mg Tabs (Candesartan cilexetil) ..... One tablet by mouth daily    Carvedilol 25 Mg Tabs (Carvedilol) ..... One tablet by mouth two times a day    Warfarin Sodium 5 Mg Tabs (Warfarin sodium) .[name]... Take as directed by coumadin clinic.  Problem # 2:  MITRAL REGURGITATION (ICD-424.0) His MR murmur appears stable.  Will follow this. The following medications were removed from the medication list:    Hydrochlorothiazide 25 Mg Tabs (Hydrochlorothiazide) ..... Hold His updated medication list for this problem includes:    Digoxin 0.25 Mg Tabs (Digoxin) .[name]... Take one tablet by mouth daily    Furosemide 40 Mg Tabs (Furosemide) .[name]... 1 and 1/2 tablet by mouth two times a day    Atacand 16 Mg Tabs (Candesartan cilexetil) ..... One tablet by mouth daily    Carvedilol 25 Mg Tabs (Carvedilol) ..... One tablet by mouth two times a day  Problem # 3:  GOUT (ICD-274.9) A low protein diet is recommended.  He has a list of foods to avoid. His updated medication list for this problem includes:    Allopurinol 100 Mg Tabs (Allopurinol) .[name]... 2 tablets by mouth daily for gout    Colcrys 0.6 Mg Tabs (Colchicine) ..... One tablet by mouth three times a day as needed for gout flare  Problem # 4:  ATRIAL FLUTTER (ICD-427.32) He has had no additiona atrial arrhythmias since stopping amiodarone.  He is s/p Ablation. The following medications were removed from the medication list:    Warfarin Sodium 2.5 Mg Tabs (Warfarin sodium) .[name]... Take as directed by coumadin clinic.    Aspir-low 81 Mg Tbec (Aspirin) .[name]... 1 by mouth once daily His updated medication list for this problem includes:    Digoxin 0.25 Mg Tabs (Digoxin) .[name]... Take one tablet by mouth daily    Carvedilol 25 Mg Tabs (Carvedilol) ..... One tablet by mouth two times a day    Warfarin Sodium 5 Mg Tabs (Warfarin sodium) .[name]... Take as directed by coumadin  clinic.  Patient Instructions: 1)  Your physician wants you to follow-up in: 6 months with Dr Ladona Ridgel.  You will receive a reminder letter in the mail two months in advance. If you don't receive a letter, please call our office to schedule the follow-up appointment. 2)  Your physician recommends that you continue on your current medications as directed. Please refer to the Current Medication list given to you today.  All of your cardiac medications have been refilled for a year at [facility]. Prescriptions: WARFARIN SODIUM 5 MG TABS (WARFARIN SODIUM) Take as directed by coumadin clinic.  #20 x 11   Entered by:   [employer]  RN BSN   Authorized by:   Laren Boom, MD, [facility]   Signed by:   Gypsy Balsam RN BSN on 02/20/2010   Method used:   Electronically to        [facility] (retail)       [address]       [place], [place]  [address]       Ph: [phone]       Fax: [phone]   RxID:   [id number] CARVEDILOL 25 MG TABS (CARVEDILOL) One tablet by mouth two times a day  #60 Each x 11   Entered by:   Proofreader   Authorized by:   Laren Boom, MD, [facility]   Signed by:   Gypsy Balsam RN BSN on 02/20/2010   Method used:   Electronically to        [facility] (retail)       [address]       [place], [place]  [address]       Ph: [phone]       Fax: [phone]   RxID:   [id number] ATACAND 16 MG TABS (CANDESARTAN CILEXETIL) One tablet by mouth daily  #30 x 11   Entered by:   Proofreader   Authorized by:   Laren Boom, MD, [facility]   Signed by:   Gypsy Balsam RN BSN on 02/20/2010   Method used:   Electronically to        [facility] (retail)       [address]       [place], [place]  [address]       Ph: [phone]       Fax: [phone]   RxID:   [id number] POTASSIUM CHLORIDE CRYS CR 20 MEQ CR-TABS (POTASSIUM CHLORIDE CRYS CR) Pt.takes  once or twice a month  #60 x 11   Entered by:   Proofreader   Authorized by:   Laren Boom, MD, [facility]   Signed by:   Gypsy Balsam RN BSN on 02/20/2010   Method used:   Electronically to        [facility] (retail)       [address]       [place], [place]  [address]       Ph: [phone]       Fax: [phone]   RxID:   [id number] FUROSEMIDE 40 MG TABS (FUROSEMIDE) 1 and 1/2 tablet by mouth two times a day  #90 Each x 11   Entered by:   Proofreader   Authorized by:   Laren Boom, MD, [facility]   Signed by:   Gypsy Balsam RN BSN on 02/20/2010   Method used:   Electronically to        [facility] (retail)       [address]       [place], [place]  [address]       Ph: [phone]       Fax: [phone]   RxID:   [id number] DIGOXIN 0.25 MG TABS (DIGOXIN) Take one tablet by mouth daily  #30 x 11   Entered  by:   Gypsy Balsam RN BSN   Authorized by:   Laren Boom, MD, [facility]   Signed by:   Gypsy Balsam RN BSN on 02/20/2010   Method used:   Electronically to        [facility] (retail)       [address]       [place], [place]  [address]       Ph: [phone]       Fax: [phone]   RxID:   [id number]

## 2010-09-12 NOTE — Medication Information (Signed)
Summary: rov/sel  Anticoagulant Therapy  Managed by: Lyna Poser, PharmD Supervising MD: Ladona Ridgel MD, Sharlot Gowda Indication 1: Atrial Fibrillation (chronic) Lab Used: [facility] [place] Site: [address] INR POC 2.1 INR RANGE 2.0-3.0  Dietary changes: yes       Details: eating less greens   Health status changes: yes       Details: been having sharp tweaks in chest that come and go and discomfort in stomach, thinks it may be gas related. seeing doctor on 18th.   Bleeding/hemorrhagic complications: no    Recent/future hospitalizations: yes       Details: was in hospital 2 weeks ago for gout. didn't change medicine at d/c. was already on colchicine and allopurinol.   Any changes in medication regimen? no    Recent/future dental: no  Any missed doses?: no       Is patient compliant with meds? yes       Allergies: 1)  ! * Some Kind of Antibx  Anticoagulation Management History:      The patient is taking warfarin and comes in today for a routine follow up visit.  Positive risk factors for bleeding include presence of serious comorbidities.  Negative risk factors for bleeding include an age less than 58 years old, no history of CVA/TIA, and no history of GI bleeding.  The bleeding index is 'intermediate risk'.  Positive CHADS2 values include History of CHF, History of HTN, and History of Diabetes.  Negative CHADS2 values include Age > 85 years old and Prior Stroke/CVA/TIA.  His last INR was 1.74.  Anticoagulation responsible Anthonio Mizzell: Ladona Ridgel MD, Sharlot Gowda.  INR POC: 2.1.  Cuvette Lot#: [id number].  Exp: 06/2011.    Anticoagulation Management Assessment/Plan:      The patient's current anticoagulation dose is Warfarin sodium 5 mg tabs: Take as directed by coumadin clinic..  The target INR is 2 - 3.  The next INR is due 07/24/2010.  Anticoagulation instructions were given to patient.  Results were reviewed/authorized by Lyna Poser, PharmD.         Prior Anticoagulation  Instructions: INR 2.0  Take a 2.5mg  and 5mg  tablet today, then continue same dose of 5mg  tablet everyday except 2.5mg  on Tuesday, Thursday, and Saturday. Recheck in 4 weeks.   Current Anticoagulation Instructions: INR 2.1 Continue taking 2.5 mg on tuesday and thursday and saturday. And 5 mg all other days. Recheck in 4 weeks.

## 2010-09-12 NOTE — Medication Information (Signed)
Summary: rov/tm  Anticoagulant Therapy  Managed by: Bethena Midget, RN, BSN Supervising MD: Daleen Squibb MD, Maisie Fus Indication 1: Atrial Fibrillation (chronic) Lab Used: [facility]  Site: [address] INR POC 2.0 INR RANGE 2.0-3.0  Dietary changes: no    Health status changes: no    Bleeding/hemorrhagic complications: no    Recent/future hospitalizations: no    Any changes in medication regimen? no    Recent/future dental: no  Any missed doses?: yes     Details: missed one dose friday.  Is patient compliant with meds? yes       Allergies: 1)  ! * Some Kind of Antibx  Anticoagulation Management History:      The patient is taking warfarin and comes in today for a routine follow up visit.  Positive risk factors for bleeding include presence of serious comorbidities.  Negative risk factors for bleeding include an age less than 48 years old, no history of CVA/TIA, and no history of GI bleeding.  The bleeding index is 'intermediate risk'.  Positive CHADS2 values include History of CHF, History of HTN, and History of Diabetes.  Negative CHADS2 values include Age > 20 years old and Prior Stroke/CVA/TIA.  His last INR was 1.74.  Anticoagulation responsible provider: Daleen Squibb MD, Maisie Fus.  INR POC: 2.0.  Cuvette Lot#: [id number].  Exp: 06/2011.    Anticoagulation Management Assessment/Plan:      The patient's current anticoagulation dose is Warfarin sodium 5 mg tabs: Take as directed by coumadin clinic..  The target INR is 2 - 3.  The next INR is due 06/26/2010.  Anticoagulation instructions were given to patient.  Results were reviewed/authorized by Bethena Midget, RN, BSN.  He was notified by Ilean Skill D candidate.         Prior Anticoagulation Instructions: INR 1.8 Today take 7.5mg s then resume 5mg s everyday except 2.5mg s on Tuesdays, Thursdays and Saturdays. Recheck in 2 weeks.   Current Anticoagulation Instructions: INR 2.0  Take a 2.5mg  and 5mg  tablet today, then  continue same dose of 5mg  tablet everyday except 2.5mg  on Tuesday, Thursday, and Saturday. Recheck in 4 weeks.

## 2010-09-12 NOTE — Letter (Signed)
Summary: *HSN Results Follow up  [facility]  [address] [place], [place] [address]   Phone: [phone]  Fax: [phone]      07/17/2010   [facility] [address] [place], [place]  [address]   Dear  Mr. Dnaiel Lowman,                            ____S.Drinkard,FNP   ____D. Gore,FNP       ____B. McPherson,MD   ____V. Rankins,MD    ____E. Cainan Trull,MD    ____N. Daphine Deutscher, FNP  ____D. Reche Dixon, MD    ____K. Philipp Deputy, MD    ____Other     This letter is to inform you that your recent test(s):  _______Pap Smear    ___X____Lab Test     _______X-ray    _______ is within acceptable limits  ___X____ requires a medication change  _______ requires a follow-up lab visit  _______ requires a follow-up visit with your provider   Comments:  We have been trying to reach you.  Please call the office at your earliest convenience.       _________________________________________________________ If you have any questions, please contact our office                     Sincerely,  Arm[place] Shannon [facility]

## 2010-09-12 NOTE — Medication Information (Signed)
Summary: rov/sp  Anticoagulant Therapy  Managed by: Bethena Midget, RN, BSN Supervising MD: Excell Seltzer MD, Casimiro Needle Indication 1: Atrial Fibrillation (chronic) Lab Used: [facility] Site: [facility] INR POC 1.4 INR RANGE 2.0-3.0  Dietary changes: no    Health status changes: yes       Details: Has Gout in Rt wrist   Bleeding/hemorrhagic complications: no    Recent/future hospitalizations: no    Any changes in medication regimen? no    Recent/future dental: no  Any missed doses?: yes     Details: Missed Wednesday's dose   Is patient compliant with meds? yes       Allergies: 1)  ! * Some Kind of Antibx  Anticoagulation Management History:      The patient is taking warfarin and comes in today for a routine follow up visit.  Positive risk factors for bleeding include presence of serious comorbidities.  Negative risk factors for bleeding include an age less than 44 years old, no history of CVA/TIA, and no history of GI bleeding.  The bleeding index is 'intermediate risk'.  Positive CHADS2 values include History of CHF, History of HTN, and History of Diabetes.  Negative CHADS2 values include Age > 24 years old and Prior Stroke/CVA/TIA.  His last INR was 1.74.  Anticoagulation responsible provider: Excell Seltzer MD, Casimiro Needle.  INR POC: 1.4.  Cuvette Lot#: [id number].  Exp: 03/2011.    Anticoagulation Management Assessment/Plan:      The patient's current anticoagulation dose is Warfarin sodium 2.5 mg tabs: Take as directed by coumadin clinic., Warfarin sodium 5 mg tabs: Take as directed by coumadin clinic..  The target INR is 2 - 3.  The next INR is due 01/25/2010.  Anticoagulation instructions were given to patient.  Results were reviewed/authorized by Bethena Midget, RN, BSN.  He was notified by Bethena Midget, RN, BSN.         Prior Anticoagulation Instructions: INR 2.3  Continue same dose of 5mg  every day except 2.5mg  on Tuesday, Thursday and Saturday   Current Anticoagulation  Instructions: INR 1.4 Today take 7.5mg s on Saturday take 5mg s then resume 5mg s everyday except 2.5mg  on Tuesdays, Thursdays and Saturdays. Recheck in 10-12 days.

## 2010-09-12 NOTE — Assessment & Plan Note (Signed)
Summary: Diabetes/HTN   Vital Signs:  Patient profile:   55 year old male Weight:      219.7 pounds BMI:     33.53 Temp:     96.9 degrees F oral Pulse rate:   84 / minute Pulse rhythm:   regular Resp:     20 per minute BP sitting:   150 / 96  (left arm) Cuff size:   large  Vitals Entered By: Levon Hedger (June 30, 2010 10:25 AM)  Nutrition Counseling: Patient's BMI is greater than 25 and therefore counseled on weight management options. CC: follow-up visit DM...pain in right side x 2 weeks that bothers him especially after he eats, Hypertension Management, CHF Management Is Patient Diabetic? Yes Pain Assessment Patient in pain? yes     Location: right side Intensity: 4 Onset of pain  Intermittent CBG Result 167 CBG Device ID B  Does patient need assistance? Functional Status Self care Ambulation Normal   CC:  follow-up visit DM...pain in right side x 2 weeks that bothers him especially after he eats, Hypertension Management, and CHF Management.  History of Present Illness:  Pt into the office for f/u on diabetes  Atrial flutter - reports he went to cardiology earlier this week for PT/INR.  He is alternating doses as per their direction  Gout - ER visit x 3 weeks ago - again doesn't appear that pt is taking meds consecutively. reviewed foods to avoid  Presents today with his medications.  Pt did NOT take his medications yet today. Pt has several medications that are months old.  Admits that he is not taking as orderd.  Co-payment at [facility] has increased which has caused some problems in pt getting meds  CHF History:      Daily weights are not being checked.  He does not understand fluid management and sodium restriction and is not following the regimen.  The patient expresses lack of understanding of the treatment plan and his medications.  He admits to lack of compliance with his medications.    Diabetes Management History:      The patient is  a 55 years old male who comes in for evaluation of Type 2 Diabetes Mellitus.  He has not been enrolled in the "Diabetic Education Program".  He states understanding of dietary principles but he is not following the appropriate diet.  No sensory loss is reported.  Self foot exams are not being performed.  He is checking home blood sugars.  He says that he is not exercising regularly.        Hypoglycemic symptoms are not occurring.  No hyperglycemic symptoms are reported.        No changes have been made to his treatment plan since last visit.    Hypertension History:      He denies headache, chest pain, and palpitations.  He notes no problems with any antihypertensive medication side effects.        Positive major cardiovascular risk factors include male age 41 years old or older, diabetes, and hypertension.  Negative major cardiovascular risk factors include non-tobacco-user status.        Positive history for target organ damage include cardiac end organ damage (either CHF or LVH).  Further assessment for target organ damage reveals no history of stroke/TIA, peripheral vascular disease, renal insufficiency, or hypertensive retinopathy.      Allergies (verified): 1)  ! * Some Kind of Antibx  Review of Systems General:  Denies fever. CV:  Denies chest pain or discomfort. Resp:  Denies cough. GI:  Complains of abdominal pain; mid abd radiating to right upper quad. MS:  Complains of joint pain; multiple joints at times.  Physical Exam  General:  alert.   Head:  normocephalic.   Mouth:  poor dentition.   Lungs:  normal breath sounds.   Heart:  irregular rhythm.   Abdomen:  normal bowel sounds.   Neurologic:  alert & oriented X3.    Diabetes Management Exam:       Nails:          Left foot: thickened          Right foot: thickened   Diabetic Foot Exam Foot Inspection Is there a history of a foot ulcer?              No Is there a foot ulcer now?              No Is there swelling or  an abnormal foot shape?          Yes Are the toenails long?                Yes Are the toenails thick?                No Are the toenails ingrown?              No Is there heavy callous build-up?              No Is there pain in the calf muscle (Intermittent claudication) when walking?    NoIs there a claw toe deformity?              No Is there elevated skin temperature?            No Is there limited ankle dorsiflexion?            No Is there foot or ankle muscle weakness?            No  Diabetic Foot Care Education Patient educated on appropriate care of diabetic feet.  Pulse Check          Right Foot          Left Foot Dorsalis Pedis:        normal            normal   Impression & Recommendations:  Problem # 1:  HYPERTENSION, BENIGN ESSENTIAL (ICD-401.1) slightly elevated will need to be sure pt is taking meds - should be able to get meds through ICP program at [facility] pharmacy His updated medication list for this problem includes:    Furosemide 40 Mg Tabs (Furosemide) .[name]... 1 and 1/2 tablet by mouth two times a day    Atacand 16 Mg Tabs (Candesartan cilexetil) ..... One tablet by mouth daily    Carvedilol 25 Mg Tabs (Carvedilol) ..... One tablet by mouth two times a day  Problem # 2:  DIABETES MELLITUS, TYPE II (ICD-250.00) stable continue current meds insulin syringes given in ofifce His updated medication list for this problem includes:    Metformin Hcl 500 Mg Tabs (Metformin hcl) .[name]... 1 tablet by mouth two times a day for diabetes    Atacand 16 Mg Tabs (Candesartan cilexetil) ..... One tablet by mouth daily    Lantus 100 Unit/ml Soln (Insulin glargine) .[name]... 25 units subcutaneously nightly for diabetes  Orders: Capillary Blood Glucose/CBG (82948) Hemoglobin A1C (52841)  Problem # 3:  NEED PROPHYLACTIC VACCINATION&INOCULATION  FLU (ICD-V04.81) given today in office  Problem # 4:  CHF (ICD-428.0)  His updated medication list for this problem includes:    Digoxin 0.25 Mg  Tabs (Digoxin) .[name]... Take one tablet by mouth daily    Furosemide 40 Mg Tabs (Furosemide) .[name]... 1 and 1/2 tablet by mouth two times a day    Atacand 16 Mg Tabs (Candesartan cilexetil) ..... One tablet by mouth daily    Carvedilol 25 Mg Tabs (Carvedilol) ..... One tablet by mouth two times a day    Warfarin Sodium 5 Mg Tabs (Warfarin sodium) .[name]... Take as directed by coumadin clinic.  Problem # 5:  ATRIAL FLUTTER (ICD-427.32) pt to f/u with cardiology as per their directions His updated medication list for this problem includes:    Digoxin 0.25 Mg Tabs (Digoxin) .[name]... Take one tablet by mouth daily    Carvedilol 25 Mg Tabs (Carvedilol) ..... One tablet by mouth two times a day    Warfarin Sodium 5 Mg Tabs (Warfarin sodium) .[name]... Take as directed by coumadin clinic.  Complete Medication List: 1)  Colcrys 0.6 Mg Tabs (Colchicine) .... One tablet by mouth three times a day as needed for gout flare 2)  Digoxin 0.25 Mg Tabs (Digoxin) .... Take one tablet by mouth daily 3)  Furosemide 40 Mg Tabs (Furosemide) .[name].. 1 and 1/2 tablet by mouth two times a day 4)  Potassium Chloride Crys Cr 20 Meq Cr-tabs (Potassium chloride crys cr) .... Pt.takes once or twice a month 5)  Metformin Hcl 500 Mg Tabs (Metformin hcl) .[name].. 1 tablet by mouth two times a day for diabetes 6)  Atacand 16 Mg Tabs (Candesartan cilexetil) .... One tablet by mouth daily 7)  Carvedilol 25 Mg Tabs (Carvedilol) .... One tablet by mouth two times a day 8)  Blood Glucose Test Strp (Glucose blood) .... Check blood sugar twice daily 9)  Warfarin Sodium 5 Mg Tabs (Warfarin sodium) .... Take as directed by coumadin clinic. 10)  Allopurinol 300 Mg Tabs (Allopurinol) .... One tablet by mouth daily for gout 11)  Lantus 100 Unit/ml Soln (Insulin glargine) .... 25 units subcutaneously nightly for diabetes 12)  Lancets Misc (Lancets) .... Use to check blood sugar two times a day  Other Orders: Dental Referral (Dentist) Flu Vaccine 30yrs +  (84132) Admin 1st Vaccine (44010)  CHF Assessment/Plan:      The patient's current weight is 219.7 pounds.  His previous weight was 209.5 pounds.    Diabetes Management Assessment/Plan:      His blood pressure goal is < 130/80.    Hypertension Assessment/Plan:      The patient's hypertensive risk group is category C: Target organ damage and/or diabetes.  His calculated 10 year risk of coronary heart disease is 22 %.  Today's blood pressure is 150/96.  His blood pressure goal is < 130/80.  Patient Instructions: 1)  Follow up in 1 week for labs - lipids, cmet (nurse visit - $10) 2)  You have been given the flu vaccine today. 3)  Keep your appointments with cardiology as ordered 4)  Diabetes - doing well. continue current medications. 5)  Hgba1c = 7.1 (goal less than 7) 6)  Blood pressure - elevated today.  Take medications as ordered. 7)  Medication refills have been sent to [facility].  Call on Tuesday to make sure you can pick up on Wednesday. 8)  On the sunday AFTER Thanksgiving No FRIED foods for at least 1 week.  You are eating too many  fried foods which is causing your gallbladder to work too hard. 9)  Follow up with n.martin,fnp in 3 months for diabetes or sooner if necessary. Prescriptions: BLOOD GLUCOSE TEST  STRP (GLUCOSE BLOOD) check blood sugar twice daily  #100 x 5   Entered and Authorized by:   Lehman Prom FNP   Signed by:   Lehman Prom FNP on 06/30/2010   Method used:   Faxed to ...       [facility] (retail)       [address] [place], [place]  [address]       Ph: [phone]       Fax: [phone]   RxID:   [id number] LANTUS 100 UNIT/ML SOLN (INSULIN GLARGINE) 25 units subcutaneously nightly for diabetes  #1 month qs x 11   Entered and Authorized by:   Lehman Prom FNP   Signed by:   Lehman Prom FNP on 06/30/2010   Method used:   Faxed to ...       [facility] -  Pharmac (retail)       [address] [place], [place]  [address]       Ph: [phone]       Fax: [phone]   RxID:   [id number]    Orders Added: 1)  Capillary Blood Glucose/CBG [82948] 2)  Est. Patient Level IV [88416] 3)  Hemoglobin A1C [83036] 4)  Dental Referral [Dentist] 5)  Flu Vaccine 75yrs + [90658] 6)  Admin 1st Vaccine [90471]   Immunizations Administered:  Influenza Vaccine # 1:    Vaccine Type: Fluvax 3+    Site: left deltoid    Mfr: GlaxoSmithKline    Dose: 0.5 ml    Route: IM    Exp. Date: 02/10/2011    Lot #: [id number]    VIS given: 03/07/10 version given June 30, 2010.  Flu Vaccine Consent Questions:    Do you have a history of severe allergic reactions to this vaccine? no    Any prior history of allergic reactions to egg and/or gelatin? no    Do you have a sensitivity to the preservative Thimersol? no    Do you have a past history of Guillan-Barre Syndrome? no    Do you currently have an acute febrile illness? no    Have you ever had a severe reaction to latex? no    Vaccine information given and explained to patient? yes   Immunizations Administered:  Influenza Vaccine # 1:    Vaccine Type: Fluvax 3+    Site: left deltoid    Mfr: GlaxoSmithKline    Dose: 0.5 ml    Route: IM    Exp. Date: 02/10/2011    Lot #: [id number]    VIS given: 03/07/10 version given June 30, 2010.  Prevention & Chronic Care Immunizations   Influenza vaccine: Fluvax 3+  (06/30/2010)    Tetanus booster: Not documented    Pneumococcal vaccine: historical per pt  (03/06/2009)  Colorectal Screening   Hemoccult: Not documented    Colonoscopy: Not documented  Other Screening   PSA: 0.91  (03/30/2010)   Smoking status: quit  (04/07/2009)  Diabetes Mellitus   HgbA1C: 7.1  (06/30/2010)   HgbA1C action/deferral: Ordered  (03/30/2010)    Eye exam: suggestive of Glaucoma  (04/28/2009)    Foot exam: yes  (03/30/2010)   High risk foot:  Not documented   Foot  care education: Done  (06/30/2010)    Urine microalbumin/creatinine ratio: Not documented   Urine microalbumin action/deferral: Ordered   Urine microalbumin/cr due: 03/31/2011  Lipids   Total Cholesterol: 201  (05/05/2009)   LDL: 133  (05/05/2009)   LDL Direct: Not documented   HDL: 50  (05/05/2009)   Triglycerides: 90  (05/05/2009)  Hypertension   Last Blood Pressure: 150 / 96  (06/30/2010)   Serum creatinine: 1.22  (12/28/2009)   Serum potassium 3.7  (12/28/2009)  Self-Management Support :    Diabetes self-management support: Not documented    Hypertension self-management support: Not documented   Nursing Instructions: Give Flu vaccine today   Laboratory Results   Blood Tests   Date/Time Received: June 30, 2010 11:12 AM   HGBA1C: 7.1%   (Normal Range: Non-Diabetic - 3-6%   Control Diabetic - 6-8%) CBG Random:: 167mg /dL     Laboratory Results   Blood Tests     HGBA1C: 7.1%   (Normal Range: Non-Diabetic - 3-6%   Control Diabetic - 6-8%) CBG Random:: 167

## 2010-09-12 NOTE — Progress Notes (Signed)
Summary: Refills Medication  Medications Added COLCRYS 0.6 MG TABS (COLCHICINE) One tablet by mouth three times a day as needed for gout flare       Phone Note Call from Patient Call back at [facility] Phone [phone]   Summary of Call: The pt has a terrible pain in his right hands of gout  and he wants to be seen asap but next available appointment is until next week.  Pt is wondered what to do because the pharmacy discontinue the coltrisin. [facility] or [facility]) [place] [facility] Initial call taken by: Manon Hilding,  December 05, 2009 3:22 PM  Follow-up for Phone Call        colchicine last filled 05/05/09 #100 forward to N. Daphine Deutscher, FNP Follow-up by: Levon Hedger,  December 05, 2009 3:40 PM  Additional Follow-up for Phone Call Additional follow up Details #1::        there is another medication that is available for gout flares that pt can get from [facility] pharmacy named colcrys.  I have faxed Rx there.  Ask pt is he still taking digoxin.  Still getting refill requests from [facility] pharmacy for his digoxin.  no refills on this medication since 2010 so unsure if pt is restarting the medication or has he received the previous Rx from cardiology. Additional Follow-up by: Lehman Prom FNP,  December 06, 2009 8:45 AM    Additional Follow-up for Phone Call Additional follow up Details #2::    left message for pt to return call to the office Levon Hedger  December 08, 2009 11:50 AM left message on answer machine for pt. to return call Gaylyn Cheers RN  Dec 13, 2009 10:18 AM  Someone called and gave receptionist a different # for him [phone], number call left message on answer machine for pt. to return call Gaylyn Cheers RN  Dec 13, 2009 12:40 PM   Levon Hedger  Dec 14, 2009 12:40 PM pt in office and informed of above information.  Pt states that he is still using the digoxin and is scheduled to see provider on 12/28/2009   Additional Follow-up for Phone Call Additional  follow up Details #3:: Details for Additional Follow-up Action Taken: noted Additional Follow-up by: Lehman Prom FNP,  Dec 14, 2009 1:42 PM  New/Updated Medications: COLCRYS 0.6 MG TABS (COLCHICINE) One tablet by mouth three times a day as needed for gout flare Prescriptions: DIGOXIN 0.25 MG TABS (DIGOXIN) Take one tablet by mouth daily  #30 x 3   Entered and Authorized by:   Lehman Prom FNP   Signed by:   Lehman Prom FNP on 12/08/2009   Method used:   Electronically to        [facility] (retail)       [address]       [place], [place]  [address]       Ph: [phone]       Fax: [phone]   RxID:   [id number] COLCRYS 0.6 MG TABS (COLCHICINE) One tablet by mouth three times a day as needed for gout flare  #60 x 0   Entered and Authorized by:   Lehman Prom FNP   Signed by:   Lehman Prom FNP on 12/06/2009   Method used:   Faxed to ...       [facility] (retail)       [address]  [address]       [place], [place]  [address]       Ph: [phone]       Fax: [phone]   RxID:   [id number]

## 2010-09-12 NOTE — Assessment & Plan Note (Signed)
Summary: Diabetes/Gout   Vital Signs:  Patient profile:   55 year old male Weight:      209.5 pounds BMI:     31.97 Temp:     97.9 degrees F oral Pulse rate:   71 / minute Pulse rhythm:   regular Resp:     16 per minute BP sitting:   136 / 71  (left arm) Cuff size:   large  Vitals Entered By: Levon Hedger (March 30, 2010 3:57 PM)  Nutrition Counseling: Patient's BMI is greater than 25 and therefore counseled on weight management options. CC: follow-up visit dm...right hand pain and swelling x 2 days...Marland Kitchenneeds refills, Hypertension Management, CHF Management Is Patient Diabetic? Yes Pain Assessment Patient in pain? yes     Location: hand CBG Result 159  Does patient need assistance? Functional Status Self care Ambulation Normal   CC:  follow-up visit dm...right hand pain and swelling x 2 days...Marland Kitchenneeds refills, Hypertension Management, and CHF Management.  History of Present Illness:  Pt into the office for diabetes and gout.  Coumadin - managed at Card[facility] - pt reports he missed last visit.  All medications present today with pt however he is not fasting for lipids  Gout - allopurinol has not been filled sicne 2010. pt reports that he only takes as needed along with colrys.  Advised pt that he should be taking allopurinol daily and colrys as needed.  Admits that he drinks ETOH (beer) at time which causes flares.  This he believes to be the cause for latest episode in his right 1st finger.  he is aware of foods to avoid such as organ meats and shellfish  Diabetes Management History:      The patient is a 55 years old male who comes in for evaluation of Type 2 Diabetes Mellitus.  He has not been enrolled in the "Diabetic Education Program".  He states lack of understanding of dietary principles and is not following his diet appropriately.  No sensory loss is reported.  Self foot exams are not being performed.  He is checking home blood sugars.  He says that he is not  exercising regularly.        Other comments include: pt has stopped taking his insulin because he does not have syringes.        No changes have been made to his treatment plan since last visit.  Treatment plan changes were initiated by MD.    Hypertension History:      He denies headache, chest pain, and palpitations.  He notes no problems with any antihypertensive medication side effects.        Positive major cardiovascular risk factors include male age 11 years old or older, diabetes, and hypertension.  Negative major cardiovascular risk factors include non-tobacco-user status.        Positive history for target organ damage include cardiac end organ damage (either CHF or LVH).  Further assessment for target organ damage reveals no history of stroke/TIA, peripheral vascular disease, renal insufficiency, or hypertensive retinopathy.      Medications Prior to Update: 1)  Allopurinol 100 Mg  Tabs (Allopurinol) .... 2 Tablets By Mouth Daily For Gout 2)  Colcrys 0.6 Mg Tabs (Colchicine) .... One Tablet By Mouth Three Times A Day As Needed For Gout Flare 3)  Digoxin 0.25 Mg Tabs (Digoxin) .... Take One Tablet By Mouth Daily 4)  Furosemide 40 Mg Tabs (Furosemide) .[name].. 1 and 1/2 Tablet By Mouth Two Times A Day 5)  Potassium Chloride Crys Cr 20 Meq Cr-Tabs (Potassium Chloride Crys Cr) .... Pt.takes Once or Twice A Month 6)  Metformin Hcl 500 Mg Tabs (Metformin Hcl) .[name].. 1 Tablet By Mouth Two Times A Day For Diabetes 7)  Lantus 100 Unit/ml Soln (Insulin Glargine) .... 25 Units Subcutaneously Nightly For Diabetes 8)  Atacand 16 Mg Tabs (Candesartan Cilexetil) .... One Tablet By Mouth Daily 9)  Carvedilol 25 Mg Tabs (Carvedilol) .... One Tablet By Mouth Two Times A Day 10)  Blood Glucose Test  Strp (Glucose Blood) .... Check Blood Sugar Twice Daily 11)  Warfarin Sodium 5 Mg Tabs (Warfarin Sodium) .... Take As Directed By Coumadin Clinic.  Current Medications (verified): 1)  Allopurinol 100 Mg  Tabs  (Allopurinol) .... 2 Tablets By Mouth Daily For Gout 2)  Colcrys 0.6 Mg Tabs (Colchicine) .... One Tablet By Mouth Three Times A Day As Needed For Gout Flare 3)  Digoxin 0.25 Mg Tabs (Digoxin) .... Take One Tablet By Mouth Daily 4)  Furosemide 40 Mg Tabs (Furosemide) .[name].. 1 and 1/2 Tablet By Mouth Two Times A Day 5)  Potassium Chloride Crys Cr 20 Meq Cr-Tabs (Potassium Chloride Crys Cr) .... Pt.takes Once or Twice A Month 6)  Metformin Hcl 500 Mg Tabs (Metformin Hcl) .[name].. 1 Tablet By Mouth Two Times A Day For Diabetes 7)  Lantus 100 Unit/ml Soln (Insulin Glargine) .... 25 Units Subcutaneously Nightly For Diabetes 8)  Atacand 16 Mg Tabs (Candesartan Cilexetil) .... One Tablet By Mouth Daily 9)  Carvedilol 25 Mg Tabs (Carvedilol) .... One Tablet By Mouth Two Times A Day 10)  Blood Glucose Test  Strp (Glucose Blood) .... Check Blood Sugar Twice Daily 11)  Warfarin Sodium 5 Mg Tabs (Warfarin Sodium) .... Take As Directed By Coumadin Clinic. 12)  Allopurinol 300 Mg Tabs (Allopurinol) .... One Tablet By Mouth Daily For Gout  Allergies (verified): 1)  ! * Some Kind of Antibx  Review of Systems General:  Denies fever. CV:  Denies chest pain or discomfort. Resp:  Denies cough. GI:  Denies abdominal pain, nausea, and vomiting. MS:  Complains of joint pain; right 1st finger - PIP joint.  Physical Exam  General:  alert.   Head:  normocephalic.   Mouth:  fair dentition.   Lungs:  normal respiratory effort.   Heart:  irregular rhythm.   Abdomen:  normal bowel sounds.   Msk:  right index finger - tender and swelling at DIP Neurologic:  alert & oriented X3.   Skin:  color normal.   Psych:  Oriented X3.    Diabetes Management Exam:    Foot Exam (with socks and/or shoes not present):       Sensory-Monofilament:          Left foot: normal          Right foot: normal   Impression & Recommendations:  Problem # 1:  DIABETES MELLITUS, TYPE II (ICD-250.00) Hgba1c is ok ontinue oral agents - pt  has self discontinued lantus insulin anyway (advised him not to stop meds in the future without consulting provider) The following medications were removed from the medication list:    Lantus 100 Unit/ml Soln (Insulin glargine) .[name]... 25 units subcutaneously nightly for diabetes His updated medication list for this problem includes:    Metformin Hcl 500 Mg Tabs (Metformin hcl) .[name]... 1 tablet by mouth two times a day for diabetes    Atacand 16 Mg Tabs (Candesartan cilexetil) ..... One tablet by mouth daily  Orders: T-Urine  Microalbumin w/creat. ratio [phone]) T-TSH [phone]) T-PSA ([id number])  Problem # 2:  HYPERTENSION, BENIGN ESSENTIAL (ICD-401.1) Rx for atacand sent to [facility] pharmacy so pt can get through pt assistance he is not able to purchase this medication from [facility] His updated medication list for this problem includes:    Furosemide 40 Mg Tabs (Furosemide) .[name]... 1 and 1/2 tablet by mouth two times a day    Atacand 16 Mg Tabs (Candesartan cilexetil) ..... One tablet by mouth daily    Carvedilol 25 Mg Tabs (Carvedilol) ..... One tablet by mouth two times a day  Orders: Rapid HIV  (69629)  Problem # 3:  GOUT (ICD-274.9) advised pt to take allopurinol daily The following medications were removed from the medication list:    Allopurinol 100 Mg Tabs (Allopurinol) .[name]... 2 tablets by mouth daily for gout His updated medication list for this problem includes:    Colcrys 0.6 Mg Tabs (Colchicine) ..... One tablet by mouth three times a day as needed for gout flare    Allopurinol 300 Mg Tabs (Allopurinol) ..... One tablet by mouth daily for gout  Complete Medication List: 1)  Colcrys 0.6 Mg Tabs (Colchicine) .... One tablet by mouth three times a day as needed for gout flare 2)  Digoxin 0.25 Mg Tabs (Digoxin) .... Take one tablet by mouth daily 3)  Furosemide 40 Mg Tabs (Furosemide) .[name].. 1 and 1/2 tablet by mouth two times a day 4)  Potassium Chloride Crys Cr 20 Meq  Cr-tabs (Potassium chloride crys cr) .... Pt.takes once or twice a month 5)  Metformin Hcl 500 Mg Tabs (Metformin hcl) .[name].. 1 tablet by mouth two times a day for diabetes 6)  Atacand 16 Mg Tabs (Candesartan cilexetil) .... One tablet by mouth daily 7)  Carvedilol 25 Mg Tabs (Carvedilol) .... One tablet by mouth two times a day 8)  Blood Glucose Test Strp (Glucose blood) .... Check blood sugar twice daily 9)  Warfarin Sodium 5 Mg Tabs (Warfarin sodium) .... Take as directed by coumadin clinic. 10)  Allopurinol 300 Mg Tabs (Allopurinol) .... One tablet by mouth daily for gout  CHF Assessment/Plan:      The patient's current weight is 209.5 pounds.  His previous weight was 213.25 pounds.    Diabetes Management Assessment/Plan:      His blood pressure goal is < 130/80.    Hypertension Assessment/Plan:      The patient's hypertensive risk group is category C: Target organ damage and/or diabetes.  His calculated 10 year risk of coronary heart disease is 18 %.  Today's blood pressure is 136/71.  His blood pressure goal is < 130/80.   Patient Instructions: 1)  Thank you for bring your medication into the office today 2)  Keep your appointment for the coumadin clinic. 3)  Diabetes - Your Hgba1c = 6.3 today. 4)  You can just take your diabetes pills but you can stop the insulin. 5)  Follow up in 3 months for diabetes. 6)  Come fasting for labs - lipids. Prescriptions: ATACAND 16 MG TABS (CANDESARTAN CILEXETIL) One tablet by mouth daily  #30 x 11   Entered and Authorized by:   Lehman Prom FNP   Signed by:   Lehman Prom FNP on 03/30/2010   Method used:   Faxed to ...       [facility] (retail)       [address] [place], [place]  [address]  Ph: [phone]       Fax: [phone]   RxID:   [id number] COLCRYS 0.6 MG TABS (COLCHICINE) One tablet by mouth three times a day as needed for gout flare  #60 x 1   Entered and  Authorized by:   Lehman Prom FNP   Signed by:   Lehman Prom FNP on 03/30/2010   Method used:   Faxed to ...       [facility] (retail)       [address] [place], [place]  [address]       Ph: [phone]       Fax: [phone]   RxID:   [id number] ALLOPURINOL 300 MG TABS (ALLOPURINOL) One tablet by mouth daily for gout  #30 x 5   Entered and Authorized by:   Lehman Prom FNP   Signed by:   Lehman Prom FNP on 03/30/2010   Method used:   Print then Give to Patient   RxID:   [id number] ALLOPURINOL 100 MG  TABS (ALLOPURINOL) 2 tablets by mouth daily for gout  #60 x 5   Entered and Authorized by:   Lehman Prom FNP   Signed by:   Lehman Prom FNP on 03/30/2010   Method used:   Print then Give to Patient   RxID:   [id number]   Laboratory Results   Urine Tests  Date/Time Received: March 30, 2010 3:55 PM   Routine Urinalysis   Color: lt. yellow Appearance: Clear Glucose: negative   (Normal Range: Negative) Bilirubin: negative   (Normal Range: Negative) Ketone: negative   (Normal Range: Negative) Spec. Gravity: 1.020   (Normal Range: 1.003-1.035) Blood: small   (Normal Range: Negative) pH: 5.0   (Normal Range: 5.0-8.0) Protein: negative   (Normal Range: Negative) Urobilinogen: 0.2   (Normal Range: 0-1) Nitrite: negative   (Normal Range: Negative) Leukocyte Esterace: negative   (Normal Range: Negative)     Blood Tests   Date/Time Received: March 30, 2010 3:56 PM   HGBA1C: 6.3%   (Normal Range: Non-Diabetic - 3-6%   Control Diabetic - 6-8%) CBG Random:: 159mg /dL         Last LDL:                                                 133 (05/05/2009 10:15:00 PM)        Diabetic Foot Exam    10-g (5.07) Semmes-Weinstein Monofilament Test Performed by: Levon Hedger          Right Foot          Left Foot Visual Inspection               Test Control      normal          normal Site 1         normal         normal Site 2         normal         normal Site 3         normal         normal Site 4         normal         normal Site 5  normal         normal Site 6         normal         normal Site 7         normal         normal Site 8         normal         normal Site 9         abnormal         normal Site 10         normal         normal  Impression      normal         normal   Prevention & Chronic Care Immunizations   Influenza vaccine: Not documented    Tetanus booster: Not documented    Pneumococcal vaccine: historical per pt  (03/06/2009)  Colorectal Screening   Hemoccult: Not documented    Colonoscopy: Not documented  Other Screening   PSA: Not documented   PSA ordered.   Smoking status: quit  (04/07/2009)  Diabetes Mellitus   HgbA1C: 6.3  (03/30/2010)   HgbA1C action/deferral: Ordered  (03/30/2010)    Eye exam: suggestive of Glaucoma  (04/28/2009)    Foot exam: yes  (03/30/2010)   High risk foot: Not documented   Foot care education: Not documented    Urine microalbumin/creatinine ratio: Not documented   Urine microalbumin action/deferral: Ordered   Urine microalbumin/cr due: 03/31/2011  Lipids   Total Cholesterol: 201  (05/05/2009)   LDL: 133  (05/05/2009)   LDL Direct: Not documented   HDL: 50  (05/05/2009)   Triglycerides: 90  (05/05/2009)  Hypertension   Last Blood Pressure: 136 / 71  (03/30/2010)   Serum creatinine: 1.22  (12/28/2009)   Serum potassium 3.7  (12/28/2009)  Self-Management Support :    Diabetes self-management support: Not documented    Hypertension self-management support: Not documented   Appended Document: Diabetes/Gout     Allergies: 1)  ! * Some Kind of Antibx   Complete Medication List: 1)  Colcrys 0.6 Mg Tabs (Colchicine) .... One tablet by mouth three times a day as needed for gout flare 2)  Digoxin 0.25 Mg Tabs (Digoxin) .... Take one tablet by mouth daily 3)   Furosemide 40 Mg Tabs (Furosemide) .[name].. 1 and 1/2 tablet by mouth two times a day 4)  Potassium Chloride Crys Cr 20 Meq Cr-tabs (Potassium chloride crys cr) .... Pt.takes once or twice a month 5)  Metformin Hcl 500 Mg Tabs (Metformin hcl) .[name].. 1 tablet by mouth two times a day for diabetes 6)  Atacand 16 Mg Tabs (Candesartan cilexetil) .... One tablet by mouth daily 7)  Carvedilol 25 Mg Tabs (Carvedilol) .... One tablet by mouth two times a day 8)  Blood Glucose Test Strp (Glucose blood) .... Check blood sugar twice daily 9)  Warfarin Sodium 5 Mg Tabs (Warfarin sodium) .... Take as directed by coumadin clinic. 10)  Allopurinol 300 Mg Tabs (Allopurinol) .... One tablet by mouth daily for gout   Laboratory Results  Date/Time Received: April 03, 2010 9:44 AM   Other Tests  Rapid HIV: negative

## 2010-09-12 NOTE — Assessment & Plan Note (Signed)
Summary: Diabetes/GOUT   Vital Signs:  Patient profile:   55 year old male Weight:      213.3 pounds BMI:     32.55 BSA:     2.10 Temp:     98.3 degrees F oral Pulse rate:   68 / minute Pulse rhythm:   regular Resp:     16 per minute BP sitting:   146 / 94  (left arm) Cuff size:   large  Vitals Entered By: Levon Hedger (Dec 28, 2009 3:55 PM) CC: abdominal pain, Hypertension Management Is Patient Diabetic? Yes Pain Assessment Patient in pain? yes     Location: hand, arm.,wrist Intensity: 6 Onset of pain  Constant at night it gets worse and it throbs CBG Result 102 CBG Device ID A  Does patient need assistance? Functional Status Self care Ambulation Normal   CC:  abdominal pain and Hypertension Management.  History of Present Illness:  Pt into the office for f/u. pt has not been seen here in over 1 year  Recent ER visit  - GOUT Right wrist Pt has started colrys as ordered but still has some pain and inflammation in right wrist.  Hx - afib Pt has not been to cardiology as per his routine f/u scheduled.  He is on coumadin but admits that he missed his last PT/INR check  Diabetes Management History:      The patient is a 55 years old male who comes in for evaluation of Type 2 Diabetes Mellitus.  He has not been enrolled in the "Diabetic Education Program".  He states understanding of dietary principles.  No sensory loss is reported.  Self foot exams are not being performed.  He is checking home blood sugars.  He says that he is not exercising regularly.        Hypoglycemic symptoms are not occurring.  No hyperglycemic symptoms are reported.        No changes have been made to his treatment plan since last visit.    Hypertension History:      He denies headache, chest pain, and palpitations.  He notes no problems with any antihypertensive medication side effects.        Positive major cardiovascular risk factors include male age 23 years old or older, diabetes,  and hypertension.  Negative major cardiovascular risk factors include non-tobacco-user status.        Positive history for target organ damage include cardiac end organ damage (either CHF or LVH).  Further assessment for target organ damage reveals no history of stroke/TIA, peripheral vascular disease, renal insufficiency, or hypertensive retinopathy.      Allergies (verified): 1)  ! * Some Kind of Antibx  Review of Systems CV:  Denies chest pain or discomfort and palpitations. GI:  Denies abdominal pain, nausea, and vomiting. MS:  Complains of joint pain; right wrist.  Physical Exam  General:  alert.   Head:  normocephalic.   Mouth:  poor dentation Lungs:  normal breath sounds.   Heart:  irregular rhythm.   Neurologic:  alert & oriented X3.     Wrist/Hand Exam  Wrist Exam:    Right:    Inspection:  Abnormal    Palpation:  Abnormal    Stability:  stable    Tenderness:  general    Swelling:  general    Erythema:  no   Impression & Recommendations:  Problem # 1:  GOUT (ICD-274.9) Will check uric acid level today will give depomedrol and toradol IM  in office His updated medication list for this problem includes:    Allopurinol 100 Mg Tabs (Allopurinol) .[name]... 2 tablets by mouth daily for gout    Colcrys 0.6 Mg Tabs (Colchicine) ..... One tablet by mouth three times a day as needed for gout flare  Orders: T-Uric Acid (Blood) (04540-98119) Admin of Therapeutic Inj  intramuscular or subcutaneous (14782) Depo- Medrol 80mg  (J1040)  Problem # 2:  HYPERTENSION, BENIGN ESSENTIAL (ICD-401.1) slightly elevated today His updated medication list for this problem includes:    Furosemide 40 Mg Tabs (Furosemide) .[name]... 1 and 1/2 tablet by mouth two times a day    Atacand 16 Mg Tabs (Candesartan cilexetil) ..... One tablet by mouth daily    Carvedilol 25 Mg Tabs (Carvedilol) ..... One tablet by mouth two times a day    Hydrochlorothiazide 25 Mg Tabs (Hydrochlorothiazide) .....  Hold  Orders: T-Comprehensive Metabolic Panel [phone])  Problem # 3:  DIABETES MELLITUS, TYPE II (ICD-250.00) Will check labs and hgba1c His updated medication list for this problem includes:    Metformin Hcl 500 Mg Tabs (Metformin hcl) .[name]... 1 tablet by mouth two times a day for diabetes    Lantus 100 Unit/ml Soln (Insulin glargine) .[name]... 25 units subcutaneously nightly for diabetes    Atacand 16 Mg Tabs (Candesartan cilexetil) ..... One tablet by mouth daily    Aspir-low 81 Mg Tbec (Aspirin) .[name]... 1 by mouth once daily  Orders: Capillary Blood Glucose/CBG (78469) T-Comprehensive Metabolic Panel ([id number]) T-CBC w/Diff (44010-27253) Hemoglobin A1C (66440)  Problem # 4:  ATRIAL FLUTTER (ICD-427.32) pt has NOT maintained f/u with cardiology will have him re-establish with that office as he was getting PT/INR from that office he is also on digoxin  His updated medication list for this problem includes:    Warfarin Sodium 2.5 Mg Tabs (Warfarin sodium) .[name]... Take as directed by coumadin clinic.    Digoxin 0.25 Mg Tabs (Digoxin) .[name]... Take one tablet by mouth daily    Carvedilol 25 Mg Tabs (Carvedilol) ..... One tablet by mouth two times a day    Aspir-low 81 Mg Tbec (Aspirin) .[name]... 1 by mouth once daily    Warfarin Sodium 5 Mg Tabs (Warfarin sodium) .[name]... Take as directed by coumadin clinic.  Orders: T-Protime, Auto ([id number])  Complete Medication List: 1)  Allopurinol 100 Mg Tabs (Allopurinol) .... 2 tablets by mouth daily for gout 2)  Colcrys 0.6 Mg Tabs (Colchicine) .... One tablet by mouth three times a day as needed for gout flare 3)  Warfarin Sodium 2.5 Mg Tabs (Warfarin sodium) .... Take as directed by coumadin clinic. 4)  Digoxin 0.25 Mg Tabs (Digoxin) .... Take one tablet by mouth daily 5)  Furosemide 40 Mg Tabs (Furosemide) .[name].. 1 and 1/2 tablet by mouth two times a day 6)  Potassium Chloride Crys Cr 20 Meq Cr-tabs (Potassium chloride crys cr) .... Take one  tablet by mouth daily 7)  Metformin Hcl 500 Mg Tabs (Metformin hcl) .[name].. 1 tablet by mouth two times a day for diabetes 8)  Lantus 100 Unit/ml Soln (Insulin glargine) .... 25 units subcutaneously nightly for diabetes 9)  Atacand 16 Mg Tabs (Candesartan cilexetil) .... One tablet by mouth daily 10)  Carvedilol 25 Mg Tabs (Carvedilol) .... One tablet by mouth two times a day 11)  Hydrochlorothiazide 25 Mg Tabs (Hydrochlorothiazide) .... Hold 12)  Blood Glucose Test Strp (Glucose blood) .... Check blood sugar twice daily 13)  Aspir-low 81 Mg Tbec (Aspirin) .[name].. 1 by mouth once daily 14)  Warfarin Sodium 5 Mg Tabs (Warfarin sodium) .... Take as directed by coumadin clinic.  Diabetes Management Assessment/Plan:      His blood pressure goal is < 130/80.    Hypertension Assessment/Plan:      The patient's hypertensive risk group is category C: Target organ damage and/or diabetes.  His calculated 10 year risk of coronary heart disease is 22 %.  Today's blood pressure is 146/94.  His blood pressure goal is < 130/80.  Patient Instructions: 1)  Gout - You should increase allopurinol 100mg  - 2 capsules 2)  You need to get your PT/INR checked monthly.   3)  Call for an appointment with cardiology 4)  Follow up at least 3 months for diabetes 5)  will need cbg, hgba1c   Medication Administration  Injection # 1:    Medication: Depo- Medrol 80mg     Diagnosis: GOUT (ICD-274.9)    Route: IM    Site: LUOQ gluteus    Exp Date: 08/12/2010    Lot #: Shawna Clamp    Mfr: [employer]    Comments: [id number]    Given by: Vesta Mixer CMA (Dec 28, 2009 5:50 PM)  Orders Added: 1)  Capillary Blood Glucose/CBG [82948] 2)  T-Comprehensive Metabolic Panel [[id number]] 3)  T-Uric Acid (Blood) [84550-23180] 4)  T-Protime, Auto [[id number]] 5)  T-CBC w/Diff [09811-91478] 6)  Hemoglobin A1C [83036] 7)  Est. Patient Level III [29562] 8)  Admin of Therapeutic Inj  intramuscular or subcutaneous [96372] 9)   Depo- Medrol 80mg  [J1040]   Laboratory Results   Blood Tests     CBG Random:: 102mg /dL

## 2010-09-12 NOTE — Medication Information (Signed)
Summary: rov/mw  Anticoagulant Therapy  Managed by: Bethena Midget, RN, BSN Supervising MD: Gala Romney MD, Reuel Boom Indication 1: Atrial Fibrillation (chronic) Lab Used: [facility] [place] Site: [address] INR POC 1.8 INR RANGE 2.0-3.0  Dietary changes: no    Health status changes: no    Bleeding/hemorrhagic complications: yes       Details: Experienced alittle constipation  Recent/future hospitalizations: no    Any changes in medication regimen? no    Recent/future dental: no  Any missed doses?: yes     Details: Missed Fridays and Saturdays   Is patient compliant with meds? yes       Allergies: 1)  ! * Some Kind of Antibx  Anticoagulation Management History:      The patient is taking warfarin and comes in today for a routine follow up visit.  Positive risk factors for bleeding include presence of serious comorbidities.  Negative risk factors for bleeding include an age less than 4 years old, no history of CVA/TIA, and no history of GI bleeding.  The bleeding index is 'intermediate risk'.  Positive CHADS2 values include History of CHF, History of HTN, and History of Diabetes.  Negative CHADS2 values include Age > 97 years old and Prior Stroke/CVA/TIA.  His last INR was 1.74.  Anticoagulation responsible provider: Bensimhon MD, Reuel Boom.  INR POC: 1.8.  Cuvette Lot#: [id number].  Exp: 06/2011.    Anticoagulation Management Assessment/Plan:      The patient's current anticoagulation dose is Warfarin sodium 5 mg tabs: Take as directed by coumadin clinic..  The target INR is 2 - 3.  The next INR is due 05/29/2010.  Anticoagulation instructions were given to patient.  Results were reviewed/authorized by Bethena Midget, RN, BSN.  He was notified by Bethena Midget, RN, BSN.         Prior Anticoagulation Instructions: INR 2.7 Continue taking 1 tablet of the 5 mg on sunday, monday, wednesday, and friday. Take 2.5 mg on tuesday, thursday, and saturday.  Current Anticoagulation  Instructions: INR 1.8 Today take 7.5mg s then resume 5mg s everyday except 2.5mg s on Tuesdays, Thursdays and Saturdays. Recheck in 2 weeks.

## 2010-09-12 NOTE — Medication Information (Signed)
Summary: rov/sp  Anticoagulant Therapy  Managed by: Weston Brass, PharmD Supervising MD: Riley Kill MD, Maisie Fus Indication 1: Atrial Fibrillation (chronic) Lab Used: [facility] [place] Site: [address] INR POC 1.8 INR RANGE 2.0-3.0  Dietary changes: no    Health status changes: no    Bleeding/hemorrhagic complications: no    Recent/future hospitalizations: no    Any changes in medication regimen? no    Recent/future dental: no  Any missed doses?: yes     Details: missed 2 days early in the week, ran out of Coumadin    Allergies: 1)  ! * Some Kind of Antibx  Anticoagulation Management History:      Positive risk factors for bleeding include presence of serious comorbidities.  Negative risk factors for bleeding include an age less than 80 years old, no history of CVA/TIA, and no history of GI bleeding.  The bleeding index is 'intermediate risk'.  Positive CHADS2 values include History of CHF, History of HTN, and History of Diabetes.  Negative CHADS2 values include Age > 30 years old and Prior Stroke/CVA/TIA.  His last INR was 1.74.  Anticoagulation responsible provider: Riley Kill MD, Maisie Fus.  INR POC: 1.8.  Cuvette Lot#: [id number].  Exp: 05/2011.    Anticoagulation Management Assessment/Plan:      The patient's current anticoagulation dose is Warfarin sodium 5 mg tabs: Take as directed by coumadin clinic..  The target INR is 2 - 3.  The next INR is due 04/14/2010.  Anticoagulation instructions were given to patient.  Results were reviewed/authorized by Weston Brass, PharmD.  He was notified by Liana Gerold, PharmD Candidate.         Prior Anticoagulation Instructions: INR 1.3 Today take 7.5mg s  and tomorrow take 5mg s then resume to 5mg s everyday except 2.5mg s on Tuesdays, Thursdays and Saturdays. Recheck in 10 days.   Current Anticoagulation Instructions: INR 1.8  Today take one 5mg  and one 2.5mg  tablet, then resume 5 mg daily except 2.5 mg on Tue, Thu and Sat.   Return to clinic in 2 weeks.

## 2010-09-12 NOTE — Progress Notes (Signed)
Summary: REFILLS ON COUMADIN AND DIGIXON   Phone Note Call from Patient Call back at Home Phone [phone]   Reason for Call: Refill Medication Summary of Call: [name] [name] PT. [name] [name] CALLED AND SAYS THAT HE NEEDS A REFILL ON HIS WARFARIN 2.5MG  AND 5 MG, AND HE WAYS THAT HE NEEDS HIS DIGIXON REFILLED ALSO. HE USES [facility] ON [address]. Initial call taken by: Leodis Rains,  Jan 10, 2010 9:54 AM  Follow-up for Phone Call        Received refill request from Metformin electronically from the pharmacy - already done advise pt he should be taking metformin twice daily - blood sugar slightly elevated during the last visit so he should take as ordered Follow-up by: Lehman Prom FNP,  Jan 10, 2010 9:25 AM  Additional Follow-up for Phone Call Additional follow up Details #1::        pt informed. Additional Follow-up by: Levon Hedger,  Jan 10, 2010 3:32 PM

## 2010-09-12 NOTE — Medication Information (Signed)
Summary: overdue for follow-up/ewj  Anticoagulant Therapy  Managed by: Bethena Midget, RN, BSN Supervising MD: Riley Kill MD, Maisie Fus Indication 1: Atrial Fibrillation (chronic) Lab Used: [facility] [place] Site: [address] INR POC 1.3 INR RANGE 2.0-3.0  Dietary changes: no    Health status changes: no    Bleeding/hemorrhagic complications: no    Recent/future hospitalizations: no    Any changes in medication regimen? no    Recent/future dental: no  Any missed doses?: yes     Details: Missed Friday and Saturdays dose  Is patient compliant with meds? yes       Allergies: 1)  ! * Some Kind of Antibx  Anticoagulation Management History:      The patient is taking warfarin and comes in today for a routine follow up visit.  Positive risk factors for bleeding include presence of serious comorbidities.  Negative risk factors for bleeding include an age less than 64 years old, no history of CVA/TIA, and no history of GI bleeding.  The bleeding index is 'intermediate risk'.  Positive CHADS2 values include History of CHF, History of HTN, and History of Diabetes.  Negative CHADS2 values include Age > 5 years old and Prior Stroke/CVA/TIA.  His last INR was 1.74.  Anticoagulation responsible provider: Riley Kill MD, Maisie Fus.  INR POC: 1.3.  Cuvette Lot#: [id number].  Exp: 05/2011.    Anticoagulation Management Assessment/Plan:      The patient's current anticoagulation dose is Warfarin sodium 5 mg tabs: Take as directed by coumadin clinic..  The target INR is 2 - 3.  The next INR is due 03/16/2010.  Anticoagulation instructions were given to patient.  Results were reviewed/authorized by Bethena Midget, RN, BSN.  He was notified by Bethena Midget, RN, BSN.         Prior Anticoagulation Instructions: INR 1.4 Today take 7.5mg s on Saturday take 5mg s then resume 5mg s everyday except 2.5mg  on Tuesdays, Thursdays and Saturdays. Recheck in 10-12 days.   Current Anticoagulation  Instructions: INR 1.3 Today take 7.5mg s  and tomorrow take 5mg s then resume to 5mg s everyday except 2.5mg s on Tuesdays, Thursdays and Saturdays. Recheck in 10 days.

## 2010-09-12 NOTE — Letter (Signed)
Summary: DENTAL REFERRAL  DENTAL REFERRAL   Imported By: Arta Bruce 07/04/2010 15:49:08  _____________________________________________________________________  External Attachment:    Type:   Image     Comment:   External Document

## 2010-09-12 NOTE — Medication Information (Signed)
Summary: rov/ewj  Anticoagulant Therapy  Managed by: Bethena Midget, RN, BSN Supervising MD: Gala Romney MD, Reuel Boom Indication 1: Atrial Fibrillation (chronic) Lab Used: [facility] Site: [facility] INR POC 1.3 INR RANGE 2.0-3.0  Dietary changes: no    Health status changes: no    Bleeding/hemorrhagic complications: no    Recent/future hospitalizations: no    Any changes in medication regimen? no    Recent/future dental: no  Any missed doses?: yes     Details: missed Tues and Wednesdays dose   Is patient compliant with meds? yes       Allergies: 1)  ! * Some Kind of Antibx  Anticoagulation Management History:      The patient is taking warfarin and comes in today for a routine follow up visit.  Positive risk factors for bleeding include presence of serious comorbidities.  Negative risk factors for bleeding include an age less than 37 years old, no history of CVA/TIA, and no history of GI bleeding.  The bleeding index is 'intermediate risk'.  Positive CHADS2 values include History of CHF, History of HTN, and History of Diabetes.  Negative CHADS2 values include Age > 52 years old and Prior Stroke/CVA/TIA.  His last INR was 1.7.  Anticoagulation responsible provider: Suzi Hernan MD, Reuel Boom.  INR POC: 1.3.  Cuvette Lot#: [id number].  Exp: 11/2010.    Anticoagulation Management Assessment/Plan:      The patient's current anticoagulation dose is Warfarin sodium 2.5 mg tabs: Take as directed by coumadin clinic., Warfarin sodium 5 mg tabs: Take as directed by coumadin clinic..  The target INR is 2 - 3.  The next INR is due 10/03/2009.  Anticoagulation instructions were given to patient.  Results were reviewed/authorized by Bethena Midget, RN, BSN.  He was notified by Bethena Midget, RN, BSN.         Prior Anticoagulation Instructions: INR: 1.9 Take extra 2.5mg  today then resume to 5mg  tablet daily except 2.5mg  on Tuesdays, Thursdays, Saturdays Recheck in 2 weeeks  Current  Anticoagulation Instructions: INR 1.3 Today that 7.5mg s and Saturday take 5mg s then resume 5mg  everyday except 2.5mg s on Tuesdays, Thursdays and Saturdays. Recheck in 10 days.

## 2010-09-14 NOTE — Assessment & Plan Note (Signed)
Summary: 6 month return.amber   Visit Type:  Follow-up Primary Provider:  Lehman Prom FNP   History of Present Illness: [name] [name] returns today for additional followup.  He is a pleasant 55 yo man with a h/o mixed CM, paroxysmal atrial flutter and fibrillation, and HTN.  He admits to dietary indiscretion.  He has been bothered by Gout and has been diagnosed with DM.   He can do most of his job without much difficulty but notes some difficulty climbing up scaffolding.  He has had no syncope or near syncope or c/p.  His CHF is class 2 at present.  Current Medications (verified): 1)  Colcrys 0.6 Mg Tabs (Colchicine) .... One Tablet By Mouth Three Times A Day As Needed For Gout Flare 2)  Digoxin 0.25 Mg Tabs (Digoxin) .... Take One Tablet By Mouth Daily 3)  Furosemide 40 Mg Tabs (Furosemide) .[name].. 1 and 1/2 Tablet By Mouth Two Times A Day 4)  Potassium Chloride Crys Cr 20 Meq Cr-Tabs (Potassium Chloride Crys Cr) .... Pt Has Stopped Taking This Medication. 5)  Metformin Hcl 500 Mg Tabs (Metformin Hcl) .[name].. 1 Tablet By Mouth Two Times A Day For Diabetes 6)  Atacand 16 Mg Tabs (Candesartan Cilexetil) .... One Tablet By Mouth Daily 7)  Carvedilol 25 Mg Tabs (Carvedilol) .... One Tablet By Mouth Two Times A Day 8)  Blood Glucose Test  Strp (Glucose Blood) .... Check Blood Sugar Twice Daily 9)  Warfarin Sodium 5 Mg Tabs (Warfarin Sodium) .... Take As Directed By Coumadin Clinic. 10)  Allopurinol 300 Mg Tabs (Allopurinol) .... One Tablet By Mouth Daily For Gout 11)  Lantus 100 Unit/ml Soln (Insulin Glargine) .... 25 Units Subcutaneously Nightly For Diabetes 12)  Lancets  Misc (Lancets) .... Use To Check Blood Sugar Two Times A Day 13)  Crestor 10 Mg Tabs (Rosuvastatin Calcium) .... One Tablet By Mouth Nightly For Cholesterol  Allergies: 1)  ! * Some Kind of Antibx  Past History:  Past Medical History: Last updated: 07/23/2008 Colonic polyps, hx of atrial flutter 2007 atrial fibrillation  2008 hypertension Cough on amiodarone with elevated ESR  Past Surgical History: Last updated: 07/23/2008 CATH(12/05/2001) STRESS TEST (2001 AND 2003) Catheter ablation of atrial flutter 2007 and 2008  Review of Systems       The patient complains of dyspnea on exertion.  The patient denies chest pain, syncope, and peripheral edema.    Vital Signs:  Patient profile:   55 year old male Height:      68 inches Weight:      213 pounds BMI:     32.50 Pulse rate:   88 / minute BP sitting:   152 / 98  (left arm)  Vitals Entered By: Laurance Flatten CMA (August 24, 2010 10:38 AM)  Physical Exam  General:  alert.   Head:  normocephalic.   Mouth:  poor dentition.   Neck:  JVD 7 cm. Lungs:  normal breath sounds with minimal rales in the bases. Heart:  RRR with normal S1 and S2. PMI is enlarged and laterally displaced. Abdomen:  normal bowel sounds.   Msk:  right index finger - tender and swelling at DIP Pulses:  pulses normal in all 4 extremities Extremities:  No clubbing or cyanosis. Neurologic:  Alert and oriented x 3.   EKG  Procedure date:  08/24/2010  Findings:      Normal sinus rhythm with rate of: 89. Non-specific ST-T wave changes noted.  Left ventricular hypertrophy.  Impression & Recommendations:  Problem # 1:  CHF (ICD-428.0) His symptoms are class 2. He will continue his current meds and maintain a low sodium diet. His updated medication list for this problem includes:    Digoxin 0.25 Mg Tabs (Digoxin) .[name]... Take one tablet by mouth daily    Furosemide 40 Mg Tabs (Furosemide) .[name]... 1 and 1/2 tablet by mouth two times a day    Atacand 16 Mg Tabs (Candesartan cilexetil) ..... One tablet by mouth daily    Carvedilol 25 Mg Tabs (Carvedilol) ..... One tablet by mouth two times a day    Warfarin Sodium 5 Mg Tabs (Warfarin sodium) .[name]... Take as directed by coumadin clinic.  Problem # 2:  ATRIAL FLUTTER (ICD-427.32) He has had no recurrent symptomatic atrial  arrhythmias. Will follow. His updated medication list for this problem includes:    Digoxin 0.25 Mg Tabs (Digoxin) .[name]... Take one tablet by mouth daily    Carvedilol 25 Mg Tabs (Carvedilol) ..... One tablet by mouth two times a day    Warfarin Sodium 5 Mg Tabs (Warfarin sodium) .[name]... Take as directed by coumadin clinic.  Patient Instructions: 1)  Your physician wants you to follow-up in: 12 months with Dr Court Joy will receive a reminder letter in the mail two months in advance. If you don't receive a letter, please call our office to schedule the follow-up appointment. 2)  Your physician recommends that you continue on your current medications as directed. Please refer to the Current Medication list given to you today.

## 2010-09-14 NOTE — Medication Information (Signed)
Summary: rov/mw  Anticoagulant Therapy  Managed by: Weston Brass, PharmD Supervising MD: Juanda Chance MD, Khary Schaben Indication 1: Atrial Fibrillation (chronic) Lab Used: [facility] [place] Site: [address] INR POC 3.2 INR RANGE 2.0-3.0  Dietary changes: no    Health status changes: no    Bleeding/hemorrhagic complications: no    Recent/future hospitalizations: no    Any changes in medication regimen? yes       Details: started Crestor  Recent/future dental: no  Any missed doses?: no       Is patient compliant with meds? yes       Allergies: 1)  ! * Some Kind of Antibx  Anticoagulation Management History:      The patient is taking warfarin and comes in today for a routine follow up visit.  Positive risk factors for bleeding include presence of serious comorbidities.  Negative risk factors for bleeding include an age less than 57 years old, no history of CVA/TIA, and no history of GI bleeding.  The bleeding index is 'intermediate risk'.  Positive CHADS2 values include History of CHF, History of HTN, and History of Diabetes.  Negative CHADS2 values include Age > 67 years old and Prior Stroke/CVA/TIA.  His last INR was 1.74.  Anticoagulation responsible provider: Juanda Chance MD, Smitty Cords.  INR POC: 3.2.  Cuvette Lot#: [id number].  Exp: 09/2011.    Anticoagulation Management Assessment/Plan:      The patient's current anticoagulation dose is Warfarin sodium 5 mg tabs: Take as directed by coumadin clinic..  The target INR is 2 - 3.  The next INR is due 08/21/2010.  Anticoagulation instructions were given to patient.  Results were reviewed/authorized by Weston Brass, PharmD.  He was notified by Weston Brass PharmD.         Prior Anticoagulation Instructions: INR 2.1 Continue taking 2.5 mg on tuesday and thursday and saturday. And 5 mg all other days. Recheck in 4 weeks.   Current Anticoagulation Instructions: INR 3.2  Take 2.5mg  today then resume same dose of 5mg  daily except 2.5mg  on  Tuesday, Thursday and Saturday.  Eat one extra serving of greens each week. Recheck INR in 3-4 weeks.

## 2010-09-14 NOTE — Progress Notes (Addendum)
Summary: CBGs very elevated  Phone Note Other Incoming   Summary of Call: From [facility] - Pt. at [facility] today, CBGs over over 600.  BMET not back yet.   Regular insulin 20 units over past 2 hours, and Lantus 25 units about 11:30 am.  CBG now reading 596.  Advised that there are no available appointments -- pt.'s next scheduled appt. 09/29/10.  Wants to know next step? Initial call taken by: Dutch Quint RN,  August 24, 2010 2:20 PM  Follow-up for Phone Call        Contact pharmacy and see if pt has been getting insulin from [facility]  pharmacy and refill habits for pt better yet - have them fax over a pt profile sheet Then contact pt with findings - if pt has not been taking meds then that is likely why his BS is elevated find out what he has been doing and what is the problem? finances/transportation Follow-up by: Lehman Prom FNP,  August 24, 2010 5:17 PM  Additional Follow-up for Phone Call Additional follow up Details #1::        [place] pharmacy notified - waiting for fax.  Dutch Quint RN  August 25, 2010 10:38 AM  Fax received. provider reviewed, states pt. not taking meds consistently per refill history.  Left message on answering machine for pt. to return call.  Dutch Quint RN  August 29, 2010 10:41 AM  Left message on answering machine for pt. to return call.  Dutch Quint RN  August 30, 2010 2:06 PM  Left message on answering machine for pt. to return call.   Letter sent.  Dutch Quint RN  August 31, 2010 12:39 PM      Appended Document: CBGs very elevated Takes metformin only once daily, hadn't been taking insulin.  Since elevation of CBGs to sometimes 300, 400, has been taking insulin, even more than he should be.  CBGs are not consistent.  Taking CBGs three times a day, before breakfast, lunch and at bedtime.  States he has no trouble getting his meds.  Advised that metformin should have run out by October and that he needs to take twice a day, to only take lantus as ordered.   Verbalized understanding and agreement.  Refill completed for Metformin, sent to [facility] pharmacy.  Advised that he still has refills on lantus and test strips, and to call pharmacies to fill.  Dutch Quint RN  September 01, 2010 9:34 AM  Noted. He is taking a long acting insulin so he can't adjust his dosing.  he needs to take the dose as ordered CONSECUTIVELY. If he is adjusting according to blood sugar values that is likely why he has both high and then low blood sugars.  Please be sure pt knows to take meds as ordered n.martin,fnp September 01, 2010  9:45 AM  Left message on answering machine for pt. to return call.  Dutch Quint RN  September 01, 2010 9:55 AM Left message on answering machine for pt. to return call.  Dutch Quint RN  September 04, 2010 10:06 AM  Advised pt. of provider's instructions for taking meds accurately.  Verbalized understanding and agreement.  States this morning's CBG was 212 - lowest it's been in awhile, is taking meds correctly.  Reminded to monitor diet as well.  Dutch Quint RN  September 04, 2010 10:14 AM    Clinical Lists Changes

## 2010-09-14 NOTE — Letter (Signed)
Summary: Generic Letter  [facility]  [address] [place], [place] [address]   Phone: [phone]  Fax: [phone]        08/31/2010  [facility] [address] [place], [place]  [address]  Dear Mr. Courville,  We have been unable to contact you by telephone.  Please call our office, at your earliest convenience, so that we may speak with you.   Sincerely,   Dutch Quint RN

## 2010-09-14 NOTE — Medication Information (Signed)
Summary: rov/sp  Anticoagulant Therapy  Managed by: Cloyde Reams, RN, BSN Supervising MD: Ladona Ridgel MD, Sharlot Gowda Indication 1: Atrial Fibrillation (chronic) Lab Used: [facility] [place] Site: [address] INR POC 1.5 INR RANGE 2.0-3.0  Dietary changes: no    Health status changes: no    Bleeding/hemorrhagic complications: no    Recent/future hospitalizations: no    Any changes in medication regimen? no    Recent/future dental: no  Any missed doses?: yes     Details: Missed yesterday and today  Is patient compliant with meds? yes      Comments: Pt complained of dizziness and lightheadedness while in office today.  RN checked blood glucose.  Read >600 on POC machine.  Discussed with pt's cardiologist, Dr. Ladona Ridgel.  Pt was given his normal dose of 25units of Lantus and 10 units of novolin R.  CBG was checked 1 hour later and still read >600.  A stat BMET was drawn and another 10 units of Novolin R was given.  CBG 1 hour later was 596.  The BMET showed a glucose of 770.  Dr. Ladona Ridgel examined pt.  Pt stated his dizziness and blurred vision was better.  He wanted to go home.  Dr. Ladona Ridgel agreed to let pt return home but educated on importance of taking insulin every day and monitoring his blood sugars at home.  Spoke with triage RN at [facility] to get pt  seen tomorrow.  Stated their schedule was full but she would leave a note for the provider for further instructions.  Pt aware that [facility] will be calling with further instructions. [name] [name] PharmD  August 24, 2010 4:26 PM   Allergies: 1)  ! * Some Kind of Antibx  Anticoagulation Management History:      The patient is taking warfarin and comes in today for a routine follow up visit.  Positive risk factors for bleeding include presence of serious comorbidities.  Negative risk factors for bleeding include an age less than 7 years old, no history of CVA/TIA, and no history of GI bleeding.  The bleeding index is  'intermediate risk'.  Positive CHADS2 values include History of CHF, History of HTN, and History of Diabetes.  Negative CHADS2 values include Age > 14 years old and Prior Stroke/CVA/TIA.  His last INR was 1.74.  Anticoagulation responsible provider: Ladona Ridgel MD, Sharlot Gowda.  INR POC: 1.5.  Cuvette Lot#: [id number].  Exp: 09/2011.    Anticoagulation Management Assessment/Plan:      The patient's current anticoagulation dose is Warfarin sodium 5 mg tabs: Take as directed by coumadin clinic..  The target INR is 2 - 3.  The next INR is due 09/07/2010.  Anticoagulation instructions were given to patient.  Results were reviewed/authorized by Cloyde Reams, RN, BSN.  He was notified by [name] [name] PharmD.         Prior Anticoagulation Instructions: INR 3.2  Take 2.5mg  today then resume same dose of 5mg  daily except 2.5mg  on Tuesday, Thursday and Saturday.  Eat one extra serving of greens each week. Recheck INR in 3-4 weeks.   Current Anticoagulation Instructions: INR 1.5  Take 7.5mg  today and tomorrow then resume same dose of 5mg  daily except 2.5mg  on Tuesday, Thursday, and Saturday.  Recheck INR in 2 weeks.

## 2010-09-27 ENCOUNTER — Encounter: Payer: Self-pay | Admitting: Nurse Practitioner

## 2010-09-27 ENCOUNTER — Encounter (INDEPENDENT_AMBULATORY_CARE_PROVIDER_SITE_OTHER): Payer: Self-pay | Admitting: Nurse Practitioner

## 2010-09-27 DIAGNOSIS — B379 Candidiasis, unspecified: Secondary | ICD-10-CM | POA: Insufficient documentation

## 2010-09-27 LAB — CONVERTED CEMR LAB
HDL goal, serum: 40 mg/dL
Nitrite: NEGATIVE
Protein, U semiquant: NEGATIVE
WBC Urine, dipstick: NEGATIVE

## 2010-10-04 NOTE — Assessment & Plan Note (Signed)
Summary: Diabetes   Vital Signs:  Patient profile:   55 year old male Weight:      210.0 pounds BMI:     32.05 Temp:     97.2 degrees F oral Pulse rate:   72 / minute Pulse rhythm:   regular Resp:     16 per minute BP sitting:   140 / 80  (left arm) Cuff size:   large  Vitals Entered By: Levon Hedger (September 27, 2010 4:46 PM)  Nutrition Counseling: Patient's BMI is greater than 25 and therefore counseled on weight management options. CC: follow-up visit DM uncontrolled...back pain x 2 weeks , Hypertension Management, Lipid Management Pain Assessment Patient in pain? yes     Location: back Intensity: 5  Does patient need assistance? Functional Status Self care Ambulation Normal   Primary Care Provider:  Lehman Prom FNP  CC:  follow-up visit DM uncontrolled...back pain x 2 weeks , Hypertension Management, and Lipid Management.  History of Present Illness:  Pt into the office for follow up on diabetes. Pt has been checking his blood sugar at home and his blood sugars are still high.  Diabetes Management History:      The patient is a 55 years old male who comes in for evaluation of Type 2 Diabetes Mellitus.  He has not been enrolled in the "Diabetic Education Program".  He states understanding of dietary principles but he is not following the appropriate diet.  No sensory loss is reported.  He is checking home blood sugars.  He says that he is not exercising regularly.        Other comments include: Pt is checking blood sugar two times a day .    Hypertension History:      He denies headache, chest pain, and palpitations.        Positive major cardiovascular risk factors include male age 72 years old or older, diabetes, hyperlipidemia, and hypertension.  Negative major cardiovascular risk factors include non-tobacco-user status.        Positive history for target organ damage include cardiac end organ damage (either CHF or LVH).  Further assessment for target  organ damage reveals no history of stroke/TIA, peripheral vascular disease, renal insufficiency, or hypertensive retinopathy.    Lipid Management History:      Positive NCEP/ATP III risk factors include male age 77 years old or older, diabetes, and hypertension.  Negative NCEP/ATP III risk factors include non-tobacco-user status, no prior stroke/TIA, and no peripheral vascular disease.       Allergies (verified): 1)  ! * Some Kind of Antibx  Review of Systems General:  Denies fever. CV:  Denies chest pain or discomfort. Resp:  Denies cough. GI:  Denies abdominal pain, nausea, and vomiting. GU:  Complains of dysuria. Endo:  Complains of excessive urination.  Physical Exam  General:  alert.   Head:  normocephalic.   Mouth:  poor dentition.   Lungs:  normal breath sounds.   Heart:  normal rate and regular rhythm.   Abdomen:  normal bowel sounds.   Msk:  normal ROM.   Neurologic:  alert & oriented X3.    Diabetes Management Exam:    Foot Exam (with socks and/or shoes not present):       Sensory-Monofilament:          Left foot: normal          Right foot: normal   Impression & Recommendations:  Problem # 1:  DIABETES MELLITUS, TYPE II (ICD-250.00)  Diabetes is elevated  increase lantus to 35 units - advise pt to check blood sugar two times a day  if still elevated after 10 days then increase to 45 units His updated medication list for this problem includes:    Metformin Hcl 500 Mg Tabs (Metformin hcl) .[name]... 1 tablet by mouth two times a day for diabetes    Atacand 16 Mg Tabs (Candesartan cilexetil) ..... One tablet by mouth daily    Lantus 100 Unit/ml Soln (Insulin glargine) .Marland KitchenMarland KitchenMarland KitchenMarland Kitchen 35 units subcutaneously nightly for diabetes  Orders: UA Dipstick w/o Micro (manual) (16109)  Problem # 2:  HYPERTENSION, BENIGN ESSENTIAL (ICD-401.1) Keep taking Bp meds His updated medication list for this problem includes:    Furosemide 40 Mg Tabs (Furosemide) .[name]... 1 and 1/2 tablet by  mouth two times a day    Atacand 16 Mg Tabs (Candesartan cilexetil) ..... One tablet by mouth daily    Carvedilol 25 Mg Tabs (Carvedilol) ..... One tablet by mouth two times a day  Problem # 3:  HYPERCHOLESTEROLEMIA (ICD-272.0)  His updated medication list for this problem includes:    Crestor 10 Mg Tabs (Rosuvastatin calcium) ..... One tablet by mouth nightly for cholesterol  Problem # 4:  CANDIDIASIS (ICD-112.9) reviewed with pt - likely due to elevated blood sugar  Complete Medication List: 1)  Colcrys 0.6 Mg Tabs (Colchicine) .... One tablet by mouth three times a day as needed for gout flare 2)  Digoxin 0.25 Mg Tabs (Digoxin) .... Take one tablet by mouth daily 3)  Furosemide 40 Mg Tabs (Furosemide) .[name].. 1 and 1/2 tablet by mouth two times a day 4)  Potassium Chloride Crys Cr 20 Meq Cr-tabs (Potassium chloride crys cr) .... Pt has stopped taking this medication. 5)  Metformin Hcl 500 Mg Tabs (Metformin hcl) .[name].. 1 tablet by mouth two times a day for diabetes 6)  Atacand 16 Mg Tabs (Candesartan cilexetil) .... One tablet by mouth daily 7)  Carvedilol 25 Mg Tabs (Carvedilol) .... One tablet by mouth two times a day 8)  Blood Glucose Test Strp (Glucose blood) .... Check blood sugar twice daily 9)  Warfarin Sodium 5 Mg Tabs (Warfarin sodium) .... Take as directed by coumadin clinic. 10)  Allopurinol 300 Mg Tabs (Allopurinol) .... One tablet by mouth daily for gout 11)  Lantus 100 Unit/ml Soln (Insulin glargine) .... 35 units subcutaneously nightly for diabetes 12)  Lancets Misc (Lancets) .... Use to check blood sugar two times a day 13)  Crestor 10 Mg Tabs (Rosuvastatin calcium) .... One tablet by mouth nightly for cholesterol 14)  Warfarin Sodium 2.5 Mg Tabs (Warfarin sodium) .... Use as directed by anticoagualtion clinic 15)  Nystatin-triamcinolone 100000-0.1 Unit/gm-% Crea (Nystatin-triamcinolone) .... One application topically two times a day to affected area  Diabetes Management  Assessment/Plan:      The following lipid goals have been established for the patient: Total cholesterol goal of 200; LDL cholesterol goal of 100; HDL cholesterol goal of 40; Triglyceride goal of 150.  His blood pressure goal is < 130/80.    Hypertension Assessment/Plan:      The patient's hypertensive risk group is category C: Target organ damage and/or diabetes.  His calculated 10 year risk of coronary heart disease is 27 %.  Today's blood pressure is 140/80.  His blood pressure goal is < 130/80.  Lipid Assessment/Plan:      Based on NCEP/ATP III, the patient's risk factor category is "history of diabetes".  The patient's lipid goals are as  follows: Total cholesterol goal is 200; LDL cholesterol goal is 100; HDL cholesterol goal is 40; Triglyceride goal is 150.    Patient Instructions: 1)  Increase lantus insulin to 35 units  2)  Be sure to monitor your blood sugar twice daily. 3)  If still elevated over the next 10 days then increase to 45 units  4)  Yeast infection - use the cream to affected area two times a day until healed 5)  Follow up in 2 months for diabetes. Prescriptions: NYSTATIN-TRIAMCINOLONE 100000-0.1 UNIT/GM-% CREA (NYSTATIN-TRIAMCINOLONE) One application topically two times a day to affected area  #30gm x 0   Entered and Authorized by:   Lehman Prom FNP   Signed by:   Lehman Prom FNP on 09/27/2010   Method used:   Print then Give to Patient   RxID:   [id number] LANTUS 100 UNIT/ML SOLN (INSULIN GLARGINE) 35 units subcutaneously nightly for diabetes  #1 month qs x 5   Entered and Authorized by:   Lehman Prom FNP   Signed by:   Lehman Prom FNP on 09/27/2010   Method used:   Faxed to ...       [facility] (retail)       [address] [place], [place]  [address]       Ph: [phone]       Fax: [phone]   RxID:   [id number]    Orders Added: 1)  Est. Patient Level III [62831] 2)  UA Dipstick  w/o Micro (manual) [81002]    Last LDL:                                                 111 (07/10/2010 9:49:00 PM)        Diabetic Foot Exam    10-g (5.07) Semmes-Weinstein Monofilament Test Performed by: Levon Hedger          Right Foot          Left Foot Visual Inspection               Test Control      normal         normal Site 1         normal         normal Site 2         normal         normal Site 3         normal         normal Site 4         normal         normal Site 5         normal         normal Site 6         normal         normal Site 7         normal         normal Site 8         normal         normal Site 9         normal         normal Site 10         normal  normal  Impression      normal         normal   Laboratory Results   Urine Tests  Date/Time Received: September 27, 2010 5:07 PM   Routine Urinalysis   Color: lt. yellow Appearance: Clear Glucose: >=1000   (Normal Range: Negative) Bilirubin: negative   (Normal Range: Negative) Ketone: trace (5)   (Normal Range: Negative) Spec. Gravity: 1.020   (Normal Range: 1.003-1.035) Blood: moderate   (Normal Range: Negative) pH: 5.0   (Normal Range: 5.0-8.0) Protein: negative   (Normal Range: Negative) Urobilinogen: 0.2   (Normal Range: 0-1) Nitrite: negative   (Normal Range: Negative) Leukocyte Esterace: negative   (Normal Range: Negative)

## 2010-10-09 ENCOUNTER — Emergency Department (HOSPITAL_COMMUNITY)
Admission: EM | Admit: 2010-10-09 | Discharge: 2010-10-09 | Disposition: A | Discharge status: Home / Self Care | Payer: Self-pay | Attending: Emergency Medicine | Admitting: Emergency Medicine

## 2010-10-09 DIAGNOSIS — M545 Low back pain, unspecified: Secondary | ICD-10-CM | POA: Insufficient documentation

## 2010-10-09 DIAGNOSIS — Z85830 Personal history of malignant neoplasm of bone: Secondary | ICD-10-CM | POA: Insufficient documentation

## 2010-10-09 DIAGNOSIS — K5900 Constipation, unspecified: Secondary | ICD-10-CM | POA: Insufficient documentation

## 2010-10-09 DIAGNOSIS — Z7901 Long term (current) use of anticoagulants: Secondary | ICD-10-CM | POA: Insufficient documentation

## 2010-10-09 DIAGNOSIS — I10 Essential (primary) hypertension: Secondary | ICD-10-CM | POA: Insufficient documentation

## 2010-10-09 DIAGNOSIS — I4891 Unspecified atrial fibrillation: Secondary | ICD-10-CM | POA: Insufficient documentation

## 2010-10-09 DIAGNOSIS — I509 Heart failure, unspecified: Secondary | ICD-10-CM | POA: Insufficient documentation

## 2010-10-09 DIAGNOSIS — M5430 Sciatica, unspecified side: Secondary | ICD-10-CM | POA: Insufficient documentation

## 2010-10-09 DIAGNOSIS — Z794 Long term (current) use of insulin: Secondary | ICD-10-CM | POA: Insufficient documentation

## 2010-10-09 DIAGNOSIS — E119 Type 2 diabetes mellitus without complications: Secondary | ICD-10-CM | POA: Insufficient documentation

## 2010-10-09 DIAGNOSIS — I428 Other cardiomyopathies: Secondary | ICD-10-CM | POA: Insufficient documentation

## 2010-10-09 LAB — BASIC METABOLIC PANEL
Chloride: 100 mEq/L (ref 96–112)
GFR calc Af Amer: >60 mL/min (ref >60)
Potassium: 4 mEq/L (ref 3.5–5.1)
Sodium: 136 mEq/L (ref 135–145)

## 2010-10-09 LAB — URINALYSIS, ROUTINE W REFLEX MICROSCOPIC
Bilirubin Urine: NEGATIVE
Ketones, ur: NEGATIVE mg/dL
Urine Glucose, Fasting: >1000 mg/dL — AB
pH: 5.5 (ref 5.0–8.0)

## 2010-10-09 LAB — CBC
HCT: 43.3 % (ref 39.0–52.0)
Hemoglobin: 14.1 g/dL (ref 13.0–17.0)
WBC: 6.1 10*3/uL (ref 4.0–10.5)

## 2010-10-09 LAB — DIFFERENTIAL
Basophils Absolute: 0 10*3/uL (ref 0.0–0.1)
Lymphocytes Relative: 34 % (ref 12–46)
Neutro Abs: 3.3 10*3/uL (ref 1.7–7.7)
Neutrophils Relative %: 54 % (ref 43–77)

## 2010-10-09 LAB — URINE MICROSCOPIC-ADD ON

## 2010-10-09 LAB — GLUCOSE, CAPILLARY
Glucose-Capillary: 371 mg/dL — ABNORMAL HIGH (ref 70–99)
Glucose-Capillary: 290 mg/dL — ABNORMAL HIGH (ref 70–99)

## 2010-10-11 ENCOUNTER — Encounter: Payer: Self-pay | Admitting: Internal Medicine

## 2010-10-11 DIAGNOSIS — I4892 Unspecified atrial flutter: Secondary | ICD-10-CM

## 2010-10-25 LAB — BASIC METABOLIC PANEL
BUN: 11 mg/dL (ref 6–23)
BUN: 9 mg/dL (ref 6–23)
Calcium: 8.9 mg/dL (ref 8.4–10.5)
GFR calc non Af Amer: >60 mL/min (ref >60)
GFR calc non Af Amer: >60 mL/min (ref >60)
Glucose, Bld: 148 mg/dL — ABNORMAL HIGH (ref 70–99)
Potassium: 3.6 mEq/L (ref 3.5–5.1)
Sodium: 137 mEq/L (ref 135–145)
Sodium: 139 mEq/L (ref 135–145)

## 2010-10-25 LAB — CK TOTAL AND CKMB (NOT AT ARMC)
CK, MB: 2.1 ng/mL (ref 0.3–4.0)
Relative Index: 0.8 (ref 0.0–2.5)

## 2010-10-25 LAB — LIPID PANEL
HDL: 42 mg/dL (ref >39)
LDL Cholesterol: 93 mg/dL (ref 0–99)
Total CHOL/HDL Ratio: 3.7 RATIO
Triglycerides: 99 mg/dL (ref <150)
VLDL: 20 mg/dL (ref 0–40)

## 2010-10-25 LAB — GLUCOSE, CAPILLARY
Glucose-Capillary: 181 mg/dL — ABNORMAL HIGH (ref 70–99)
Glucose-Capillary: 198 mg/dL — ABNORMAL HIGH (ref 70–99)
Glucose-Capillary: 207 mg/dL — ABNORMAL HIGH (ref 70–99)
Glucose-Capillary: 221 mg/dL — ABNORMAL HIGH (ref 70–99)

## 2010-10-25 LAB — CARDIAC PANEL(CRET KIN+CKTOT+MB+TROPI)
CK, MB: 2.1 ng/mL (ref 0.3–4.0)
Relative Index: 1.3 (ref 0.0–2.5)
Total CK: 206 U/L (ref 7–232)
Troponin I: 0.02 ng/mL (ref 0.00–0.06)

## 2010-10-25 LAB — PROTIME-INR
INR: 1.89 — ABNORMAL HIGH (ref 0.00–1.49)
INR: 2.43 — ABNORMAL HIGH (ref 0.00–1.49)
Prothrombin Time: 26.5 seconds — ABNORMAL HIGH (ref 11.6–15.2)

## 2010-10-25 LAB — TROPONIN I: Troponin I: 0.02 ng/mL (ref 0.00–0.06)

## 2010-10-25 LAB — DIFFERENTIAL
Basophils Absolute: 0 10*3/uL (ref 0.0–0.1)
Eosinophils Absolute: 0.7 10*3/uL (ref 0.0–0.7)
Lymphocytes Relative: 29 % (ref 12–46)
Monocytes Absolute: 0.6 10*3/uL (ref 0.1–1.0)
Neutro Abs: 3.1 10*3/uL (ref 1.7–7.7)

## 2010-10-25 LAB — CBC
HCT: 41.7 % (ref 39.0–52.0)
Hemoglobin: 14.1 g/dL (ref 13.0–17.0)
Hemoglobin: 14.6 g/dL (ref 13.0–17.0)
MCHC: 33.5 g/dL (ref 30.0–36.0)
RDW: 14.5 % (ref 11.5–15.5)
RDW: 15 % (ref 11.5–15.5)
WBC: 13.8 10*3/uL — ABNORMAL HIGH (ref 4.0–10.5)
WBC: 6.2 10*3/uL (ref 4.0–10.5)

## 2010-10-26 LAB — COMPREHENSIVE METABOLIC PANEL
ALT: 12 U/L (ref 0–53)
AST: 21 U/L (ref 0–37)
CO2: 28 mEq/L (ref 19–32)
Calcium: 9.6 mg/dL (ref 8.4–10.5)
Chloride: 105 mEq/L (ref 96–112)
GFR calc Af Amer: >60 mL/min (ref >60)
GFR calc non Af Amer: >60 mL/min (ref >60)
Sodium: 138 mEq/L (ref 135–145)
Total Bilirubin: 0.7 mg/dL (ref 0.3–1.2)

## 2010-10-26 LAB — DIFFERENTIAL
Eosinophils Absolute: 0.3 10*3/uL (ref 0.0–0.7)
Eosinophils Relative: 5 % (ref 0–5)
Lymphs Abs: 1.8 10*3/uL (ref 0.7–4.0)

## 2010-10-26 LAB — CBC
Hemoglobin: 14.9 g/dL (ref 13.0–17.0)
RBC: 5.17 MIL/uL (ref 4.22–5.81)
WBC: 5.9 10*3/uL (ref 4.0–10.5)

## 2010-10-26 LAB — URINALYSIS, ROUTINE W REFLEX MICROSCOPIC
Bilirubin Urine: NEGATIVE
Specific Gravity, Urine: 1.023 (ref 1.005–1.030)
pH: 5.5 (ref 5.0–8.0)

## 2010-10-26 LAB — LIPASE, BLOOD: Lipase: 25 U/L (ref 11–59)

## 2010-10-26 LAB — URINE MICROSCOPIC-ADD ON

## 2010-10-29 LAB — GLUCOSE, CAPILLARY: Glucose-Capillary: 140 mg/dL — ABNORMAL HIGH (ref 70–99)

## 2010-10-31 LAB — URIC ACID: Uric Acid, Serum: 7.5 mg/dL (ref 4.0–7.8)

## 2010-10-31 LAB — BASIC METABOLIC PANEL
GFR calc Af Amer: >60 mL/min (ref >60)
GFR calc non Af Amer: >60 mL/min (ref >60)
Glucose, Bld: 148 mg/dL — ABNORMAL HIGH (ref 70–99)
Potassium: 3.2 mEq/L — ABNORMAL LOW (ref 3.5–5.1)
Sodium: 137 mEq/L (ref 135–145)

## 2010-10-31 LAB — CBC
HCT: 46.4 % (ref 39.0–52.0)
Hemoglobin: 15.2 g/dL (ref 13.0–17.0)
RBC: 5.53 MIL/uL (ref 4.22–5.81)
WBC: 8.9 10*3/uL (ref 4.0–10.5)

## 2010-10-31 LAB — DIFFERENTIAL
Eosinophils Relative: 2 % (ref 0–5)
Lymphocytes Relative: 18 % (ref 12–46)
Lymphs Abs: 1.6 10*3/uL (ref 0.7–4.0)
Monocytes Absolute: 0.8 10*3/uL (ref 0.1–1.0)
Monocytes Relative: 8 % (ref 3–12)

## 2010-10-31 LAB — PROTIME-INR: INR: 1.41 (ref 0.00–1.49)

## 2010-11-12 ENCOUNTER — Emergency Department (HOSPITAL_COMMUNITY): Payer: Self-pay

## 2010-11-12 ENCOUNTER — Emergency Department (HOSPITAL_COMMUNITY)
Admission: EM | Admit: 2010-11-12 | Discharge: 2010-11-12 | Disposition: A | Discharge status: Home / Self Care | Payer: Self-pay | Attending: Emergency Medicine | Admitting: Emergency Medicine

## 2010-11-12 DIAGNOSIS — I4891 Unspecified atrial fibrillation: Secondary | ICD-10-CM | POA: Insufficient documentation

## 2010-11-12 DIAGNOSIS — I509 Heart failure, unspecified: Secondary | ICD-10-CM | POA: Insufficient documentation

## 2010-11-12 DIAGNOSIS — I428 Other cardiomyopathies: Secondary | ICD-10-CM | POA: Insufficient documentation

## 2010-11-12 DIAGNOSIS — E119 Type 2 diabetes mellitus without complications: Secondary | ICD-10-CM | POA: Insufficient documentation

## 2010-11-12 DIAGNOSIS — M79609 Pain in unspecified limb: Secondary | ICD-10-CM | POA: Insufficient documentation

## 2010-11-12 DIAGNOSIS — I10 Essential (primary) hypertension: Secondary | ICD-10-CM | POA: Insufficient documentation

## 2010-11-13 LAB — POCT CARDIAC MARKERS
CKMB, poc: 1.9 ng/mL (ref 1.0–8.0)
Troponin i, poc: <0.05 ng/mL (ref 0.00–0.09)

## 2010-11-19 LAB — HEPATIC FUNCTION PANEL
ALT: 13 U/L (ref 0–53)
ALT: 14 U/L (ref 0–53)
AST: 17 U/L (ref 0–37)
AST: 22 U/L (ref 0–37)
Albumin: 3.2 g/dL — ABNORMAL LOW (ref 3.5–5.2)
Albumin: 3.3 g/dL — ABNORMAL LOW (ref 3.5–5.2)
Alkaline Phosphatase: 90 U/L (ref 39–117)
Alkaline Phosphatase: 94 U/L (ref 39–117)
Bilirubin, Direct: 0.1 mg/dL (ref 0.0–0.3)
Bilirubin, Direct: <0.1 mg/dL (ref 0.0–0.3)
Indirect Bilirubin: 0.4 mg/dL (ref 0.3–0.9)
Total Bilirubin: 0.5 mg/dL (ref 0.3–1.2)
Total Bilirubin: 0.8 mg/dL (ref 0.3–1.2)
Total Protein: 8.5 g/dL — ABNORMAL HIGH (ref 6.0–8.3)
Total Protein: 8.7 g/dL — ABNORMAL HIGH (ref 6.0–8.3)

## 2010-11-19 LAB — CBC
HCT: 39.7 % (ref 39.0–52.0)
HCT: 43.4 % (ref 39.0–52.0)
Hemoglobin: 13.3 g/dL (ref 13.0–17.0)
Hemoglobin: 14.1 g/dL (ref 13.0–17.0)
MCHC: 32.5 g/dL (ref 30.0–36.0)
MCHC: 32.9 g/dL (ref 30.0–36.0)
MCV: 82 fL (ref 78.0–100.0)
MCV: 82.2 fL (ref 78.0–100.0)
Platelets: 195 10*3/uL (ref 150–400)
Platelets: 216 10*3/uL (ref 150–400)
Platelets: 216 10*3/uL (ref 150–400)
RBC: 4.84 MIL/uL (ref 4.22–5.81)
RBC: 5.28 MIL/uL (ref 4.22–5.81)
RDW: 13.4 % (ref 11.5–15.5)
WBC: 6.2 10*3/uL (ref 4.0–10.5)
WBC: 7.5 10*3/uL (ref 4.0–10.5)

## 2010-11-19 LAB — POCT I-STAT, CHEM 8
BUN: 14 mg/dL (ref 6–23)
Creatinine, Ser: 1 mg/dL (ref 0.4–1.5)
Hemoglobin: 15.6 g/dL (ref 13.0–17.0)
Potassium: 3.7 mEq/L (ref 3.5–5.1)
Sodium: 131 mEq/L — ABNORMAL LOW (ref 135–145)

## 2010-11-19 LAB — TROPONIN I: Troponin I: 0.03 ng/mL (ref 0.00–0.06)

## 2010-11-19 LAB — GLUCOSE, CAPILLARY
Glucose-Capillary: 140 mg/dL — ABNORMAL HIGH (ref 70–99)
Glucose-Capillary: 144 mg/dL — ABNORMAL HIGH (ref 70–99)
Glucose-Capillary: 146 mg/dL — ABNORMAL HIGH (ref 70–99)
Glucose-Capillary: 161 mg/dL — ABNORMAL HIGH (ref 70–99)
Glucose-Capillary: 166 mg/dL — ABNORMAL HIGH (ref 70–99)
Glucose-Capillary: 170 mg/dL — ABNORMAL HIGH (ref 70–99)
Glucose-Capillary: 189 mg/dL — ABNORMAL HIGH (ref 70–99)
Glucose-Capillary: 200 mg/dL — ABNORMAL HIGH (ref 70–99)
Glucose-Capillary: 205 mg/dL — ABNORMAL HIGH (ref 70–99)
Glucose-Capillary: 207 mg/dL — ABNORMAL HIGH (ref 70–99)
Glucose-Capillary: 251 mg/dL — ABNORMAL HIGH (ref 70–99)
Glucose-Capillary: 260 mg/dL — ABNORMAL HIGH (ref 70–99)
Glucose-Capillary: 283 mg/dL — ABNORMAL HIGH (ref 70–99)
Glucose-Capillary: 283 mg/dL — ABNORMAL HIGH (ref 70–99)
Glucose-Capillary: 289 mg/dL — ABNORMAL HIGH (ref 70–99)
Glucose-Capillary: 305 mg/dL — ABNORMAL HIGH (ref 70–99)
Glucose-Capillary: 334 mg/dL — ABNORMAL HIGH (ref 70–99)
Glucose-Capillary: 397 mg/dL — ABNORMAL HIGH (ref 70–99)
Glucose-Capillary: 540 mg/dL (ref 70–99)

## 2010-11-19 LAB — PROTIME-INR
INR: 1.7 — ABNORMAL HIGH (ref 0.00–1.49)
INR: 1.7 — ABNORMAL HIGH (ref 0.00–1.49)
INR: 1.9 — ABNORMAL HIGH (ref 0.00–1.49)
INR: 2 — ABNORMAL HIGH (ref 0.00–1.49)
Prothrombin Time: 22.4 seconds — ABNORMAL HIGH (ref 11.6–15.2)
Prothrombin Time: 22.4 seconds — ABNORMAL HIGH (ref 11.6–15.2)
Prothrombin Time: 23.8 seconds — ABNORMAL HIGH (ref 11.6–15.2)

## 2010-11-19 LAB — BASIC METABOLIC PANEL
BUN: 11 mg/dL (ref 6–23)
BUN: 8 mg/dL (ref 6–23)
Calcium: 9.8 mg/dL (ref 8.4–10.5)
Chloride: 98 mEq/L (ref 96–112)
Creatinine, Ser: 1.06 mg/dL (ref 0.4–1.5)
GFR calc Af Amer: >60 mL/min (ref >60)
GFR calc Af Amer: >60 mL/min (ref >60)
GFR calc non Af Amer: >60 mL/min (ref >60)
Potassium: 3.7 mEq/L (ref 3.5–5.1)
Sodium: 132 mEq/L — ABNORMAL LOW (ref 135–145)

## 2010-11-19 LAB — RAPID URINE DRUG SCREEN, HOSP PERFORMED
Benzodiazepines: NOT DETECTED
Cocaine: NOT DETECTED
Tetrahydrocannabinol: NOT DETECTED

## 2010-11-19 LAB — POCT CARDIAC MARKERS
Myoglobin, poc: 206 ng/mL (ref 12–200)
Troponin i, poc: <0.05 ng/mL (ref 0.00–0.09)

## 2010-11-19 LAB — DIFFERENTIAL
Basophils Relative: 0 % (ref 0–1)
Basophils Relative: 1 % (ref 0–1)
Eosinophils Absolute: 0.1 10*3/uL (ref 0.0–0.7)
Eosinophils Absolute: 0.2 10*3/uL (ref 0.0–0.7)
Eosinophils Relative: 2 % (ref 0–5)
Lymphs Abs: 1.5 10*3/uL (ref 0.7–4.0)
Monocytes Relative: 9 % (ref 3–12)
Neutro Abs: 5.2 10*3/uL (ref 1.7–7.7)
Neutrophils Relative %: 70 % (ref 43–77)
Neutrophils Relative %: 73 % (ref 43–77)

## 2010-11-19 LAB — CK TOTAL AND CKMB (NOT AT ARMC)
CK, MB: 2 ng/mL (ref 0.3–4.0)
Total CK: 311 U/L — ABNORMAL HIGH (ref 7–232)

## 2010-11-19 LAB — BLOOD GAS, ARTERIAL
Drawn by: 309681
FIO2: 0.21 %
Patient temperature: 98.6
pCO2 arterial: 33.1 mmHg — ABNORMAL LOW (ref 35.0–45.0)
pH, Arterial: 7.507 — ABNORMAL HIGH (ref 7.350–7.450)

## 2010-11-19 LAB — LIPID PANEL
Triglycerides: 123 mg/dL (ref <150)
VLDL: 25 mg/dL (ref 0–40)

## 2010-11-19 LAB — URINALYSIS, ROUTINE W REFLEX MICROSCOPIC
Bilirubin Urine: NEGATIVE
Ketones, ur: NEGATIVE mg/dL
Nitrite: NEGATIVE
Urobilinogen, UA: 0.2 mg/dL (ref 0.0–1.0)

## 2010-11-19 LAB — CARDIAC PANEL(CRET KIN+CKTOT+MB+TROPI)
CK, MB: 1.4 ng/mL (ref 0.3–4.0)
Relative Index: 0.5 (ref 0.0–2.5)
Total CK: 284 U/L — ABNORMAL HIGH (ref 7–232)
Troponin I: 0.02 ng/mL (ref 0.00–0.06)
Troponin I: 0.03 ng/mL (ref 0.00–0.06)

## 2010-11-19 LAB — HEMOGLOBIN A1C
Hgb A1c MFr Bld: 10.2 % — ABNORMAL HIGH (ref 4.6–6.1)
Mean Plasma Glucose: 246 mg/dL

## 2010-11-19 LAB — KETONES, QUALITATIVE: Acetone, Bld: NEGATIVE

## 2010-11-19 LAB — DIGOXIN LEVEL: Digoxin Level: <0.2 ng/mL — ABNORMAL LOW (ref 0.8–2.0)

## 2010-11-19 LAB — SEDIMENTATION RATE: Sed Rate: 72 mm/hr — ABNORMAL HIGH (ref 0–16)

## 2010-11-19 LAB — TSH: TSH: 0.972 u[IU]/mL (ref 0.350–4.500)

## 2010-11-19 LAB — MAGNESIUM: Magnesium: 2.2 mg/dL (ref 1.5–2.5)

## 2010-11-27 LAB — BASIC METABOLIC PANEL
BUN: 13 mg/dL (ref 6–23)
BUN: 14 mg/dL (ref 6–23)
BUN: 15 mg/dL (ref 6–23)
CO2: 29 mEq/L (ref 19–32)
Calcium: 9.2 mg/dL (ref 8.4–10.5)
Chloride: 101 mEq/L (ref 96–112)
Creatinine, Ser: 1.27 mg/dL (ref 0.4–1.5)
Creatinine, Ser: 1.37 mg/dL (ref 0.4–1.5)
GFR calc non Af Amer: 54 mL/min — ABNORMAL LOW (ref >60)
GFR calc non Af Amer: 59 mL/min — ABNORMAL LOW (ref >60)
Glucose, Bld: 133 mg/dL — ABNORMAL HIGH (ref 70–99)
Glucose, Bld: 146 mg/dL — ABNORMAL HIGH (ref 70–99)
Glucose, Bld: 131 mg/dL — ABNORMAL HIGH (ref 70–99)
Glucose, Bld: 164 mg/dL — ABNORMAL HIGH (ref 70–99)
Potassium: 3.4 mEq/L — ABNORMAL LOW (ref 3.5–5.1)
Potassium: 3.8 mEq/L (ref 3.5–5.1)
Sodium: 139 mEq/L (ref 135–145)

## 2010-11-27 LAB — CBC
Hemoglobin: 13.8 g/dL (ref 13.0–17.0)
MCHC: 32.8 g/dL (ref 30.0–36.0)
Platelets: 198 10*3/uL (ref 150–400)
RBC: 5.13 MIL/uL (ref 4.22–5.81)
RDW: 15 % (ref 11.5–15.5)

## 2010-11-27 LAB — POCT I-STAT 3, VENOUS BLOOD GAS (G3P V)
Acid-Base Excess: 1 mmol/L (ref 0.0–2.0)
O2 Saturation: 57 %
TCO2: 29 mmol/L (ref 0–100)
pH, Ven: 7.366 — ABNORMAL HIGH (ref 7.250–7.300)

## 2010-11-27 LAB — PROTIME-INR
INR: 1.5 (ref 0.00–1.49)
Prothrombin Time: 18.9 seconds — ABNORMAL HIGH (ref 11.6–15.2)
Prothrombin Time: 21.4 seconds — ABNORMAL HIGH (ref 11.6–15.2)

## 2010-11-27 LAB — CARDIAC PANEL(CRET KIN+CKTOT+MB+TROPI)
CK, MB: 2.2 ng/mL (ref 0.3–4.0)
CK, MB: 2.9 ng/mL (ref 0.3–4.0)
Relative Index: 2.1 (ref 0.0–2.5)
Relative Index: 2.4 (ref 0.0–2.5)
Troponin I: 0.02 ng/mL (ref 0.00–0.06)
Troponin I: 0.03 ng/mL (ref 0.00–0.06)

## 2010-11-27 LAB — POCT I-STAT 3, ART BLOOD GAS (G3+)
Bicarbonate: 28.9 mEq/L — ABNORMAL HIGH (ref 20.0–24.0)
O2 Saturation: 91 %
TCO2: 30 mmol/L (ref 0–100)
pCO2 arterial: 43.5 mmHg (ref 35.0–45.0)

## 2010-11-27 LAB — DIFFERENTIAL
Lymphocytes Relative: 34 % (ref 12–46)
Monocytes Absolute: 0.7 10*3/uL (ref 0.1–1.0)
Monocytes Relative: 10 % (ref 3–12)
Neutro Abs: 3.5 10*3/uL (ref 1.7–7.7)

## 2010-11-27 LAB — HEPATIC FUNCTION PANEL
ALT: 31 U/L (ref 0–53)
AST: 42 U/L — ABNORMAL HIGH (ref 0–37)
Bilirubin, Direct: 0.2 mg/dL (ref 0.0–0.3)
Total Bilirubin: 0.9 mg/dL (ref 0.3–1.2)

## 2010-11-27 LAB — GLUCOSE, CAPILLARY: Glucose-Capillary: 95 mg/dL (ref 70–99)

## 2010-11-27 LAB — BRAIN NATRIURETIC PEPTIDE: Pro B Natriuretic peptide (BNP): 422 pg/mL — ABNORMAL HIGH (ref 0.0–100.0)

## 2010-12-26 NOTE — H&P (Signed)
[facility] ADMISSION   NAME:Covey, WESTLEE                      MRN:          [id number]  DATE:08/26/2008                            DOB:          Jul 04, 1956    HISTORY:  Mr. Abdullah returns today for followup.  He is a pleasant  middle-aged man with a non-ischemic cardiomyopathy, initially thought to  be tachycardia-induced, secondary to atrial flutter, who underwent a  catheter ablation x2 several years ago.  He was initially stable and his  ejection fraction was approximately 40% after flutter ablation.  His  heart failure improved to class 2.  Over the last several months he has  had persistent increasing shortness of breath.  He initially had a cough  and a blood test demonstrated an elevated sedimentation rate, which made  [place] stop amiodarone.  Despite being in sinus rhythm, and despite up-  titration of his medications, (he had developed a cough, initially  thought to be secondary to ACE inhibitors), he continued to feel poorly.  His Lasix was up to 80 mg in the morning and 40 mg in the evening.  Despite this, he continues to have PND, orthopnea, chest fullness,  weakness and fatigue.  He has had no anginal symptoms.  The patient has  had a non-ischemic cardiomyopathy in the past with no repeat  echocardiogram done since 2008.  He had a non-ischemic Myoview in 2003,  and a catheterization demonstrating no significant coronary artery  disease.   ADDITIONAL PAST MEDICAL HISTORY:  1. Notable for remote substance abuse.  2. History of hypertension.  3. History of dyslipidemia.  4. History of chronic increase in his liver function tests in the      past.  5. He has had dyspnea for many months.  Initially I thought this was      amiodarone lung toxicity, but his dyspnea has not improved      significantly with the discontinuation of his amiodarone.  6. Gout.   FAMILY HISTORY:  Noncontributory, though  there is hypertension in his  family.   SOCIAL HISTORY:  The patient has previously worked as a Statistician.  Currently denies tobacco or ethanol abuse.   REVIEW OF SYSTEMS:  As noted in the HPI.  Otherwise all systems are  reviewed and are negative.   PHYSICAL EXAMINATION:  GENERAL:  He is a pleasant middle-aged man, in no  distress.  VITAL SIGNS:  Blood pressure 124/84, pulse 100 and regular, respirations  18, weight 231 pounds.  HEENT:  Normocephalic and atraumatic.  Pupils equal, round.  Oropharynx  moist.  Sclerae anicteric.  NECK:  Revealed 7 to 8 cm jugular venous distention.  No thyromegaly.  Trachea midline.  Carotids are 2+ and symmetric.  LUNGS:  Rales in the bases, though he was not full of fluid.  No wheezes  or rhonchi.  There was no increased work of breathing.  CARDIOVASCULAR:  A regular rate and rhythm with normal S1 and S2.  PMI  enlarged and laterally displaced .  There is a soft S4 gallop present.  ABDOMEN:  Soft, nontender.  No rebound or guarding.  EXTREMITIES:  Demonstrated no clubbing, cyanosis or edema.  Pulses 2+  and symmetric.  NEUROLOGIC:  Alert and oriented x3.  Cranial nerves intact.  Strength  was 5/5 and symmetric.   His electrocardiogram demonstrates sinus rhythm at 100, with left  ventricular dysfunction.  There are inferolateral ST-T wave changes,  though these have been present in the past.   IMPRESSION:  1. Worsening dyspnea, most likely secondary to acute on chronic      systolic heart failure, with refractory symptoms.  2. History of atrial flutter, status post ablation.  3. Atrial fibrillation, maintaining sinus rhythm, despite amiodarone.  4. Hypertension.   DISCUSSION:  The etiology of Mr. Pitta's severe dyspnea is unclear,  though I suspect it is related to his acute on chronic systolic heart  failure.  His beta natriuretic peptide when I saw him several weeks ago  was not particularly high.  My plan is to admit the patient to  the  hospital and have him at least undergo a right heart catheterization.  (I may consider a left heart catheterization, but I need to review his  records further.)  Will of course get labs, serial cardiac enzymes,  repeat a sedimentation rate and a beta natriuretic peptide.  A 2-D  echocardiogram will be done, as he has not had one in approximately two  years.  I am concerned that his LV function has worsened.  He may also  need positive inotropes to help him with the dyspnea.     Doylene Canning. Ladona Ridgel, MD  Electronically Signed    GWT/MedQ  DD: 08/26/2008  DT: 08/26/2008  Job #: [id number]

## 2010-12-26 NOTE — Assessment & Plan Note (Signed)
[facility]                         ELECTROPHYSIOLOGY OFFICE NOTE   NAME:MCCOLLUMClifton, Safley                      MRN:          [id number]  DATE:05/04/2008                            DOB:          Jan 01, 1956    [name] [name] returns today for followup.  He is a very pleasant middle-  aged male with atrial flutter status post ablation who developed atrial  fibrillation.  He has a nonischemic cardiomyopathy.  He has been  maintaining sinus for the most part on amiodarone.  I saw him back  several weeks ago and was complaining of gout acting up as well as  worsening heart failure symptoms.  The patient subsequently had up  titration of his Lasix and was placed on colchicine and allopurinol and  his gout has improved markedly.  In fact, it has resolved.  Unfortunately, he continues to have chronic cough, dyspnea with  exertion, and abdominal distention.   His medications today include Coumadin as directed, allopurinol,  lisinopril 10 a day, amiodarone 200 a day, Lasix 80 daily, and potassium  10 mEq daily.   PHYSICAL EXAMINATION:  GENERAL:  On exam, he is a pleasant middle-aged  man in no acute distress.  VITAL SIGNS:  Blood pressure was 156/96, the pulse was 95 and regular,  the respirations were 18, the weight was 222 pounds.  NECK:  Revealed no obvious jugular distention.  LUNGS:  Revealed scattered rales bilaterally.  There were no wheezes.  There was no increased work of breathing.  CARDIAC:  Regular rate and rhythm with normal S1 and S2.  There was a  soft S4 gallop present.  ABDOMEN:  Protuberant.  There was no obvious fluid wave.  EXTREMITIES:  Demonstrated no peripheral edema.   IMPRESSION:  1. Dyspnea of unclear etiology, question heart failure, question      amiodarone lung toxicity versus some other cause versus a      combination.  2. Atrial fibrillation maintaining sinus rhythm on a fairly high dose      (400 a day of amiodarone).  3.  Gout, now resolved after treatment with allopurinol and colchicine.   DISCUSSION:  Today, we are going to have Mr. Ruelas do a PA and  lateral chest x-ray to better understand his mechanism of his dyspnea.  Also, we will plan on having him take his sed rate, BMP, and a BNP.  I  will see him back in the office in several weeks.     Doylene Canning. Ladona Ridgel, MD  Electronically Signed    GWT/MedQ  DD: 05/04/2008  DT: 05/04/2008  Job #: [id number]

## 2010-12-26 NOTE — Assessment & Plan Note (Signed)
[facility]                         ELECTROPHYSIOLOGY OFFICE NOTE   NAME:MCCOLLUMRockne, Dearinger                      MRN:          [id number]  DATE:01/16/2007                            DOB:          [birth year]    Mr. Weekes returns today for followup.  He is a very pleasant middle-  aged man with a history of hypertension and atrial flutter, who  underwent catheter ablation.  He subsequently developed atrial  fibrillation and had some hemorrhoidal bleeding.  He was hospitalized  back in early May.  At that time, a TEE was done in attempts for DC  cardioversion, which demonstrated a left atrial appendage thrombus.  He  has been on Coumadin since then but has had trouble with elevated INRs  and worsening rectal bleeding secondary to hemorrhoids.  He recently  stopped his Coumadin and started it back today.  His INR today was 3.  He returns today for followup.  He has dyspnea with exertion, especially  while walking.  He denies palpitations.   PHYSICAL EXAMINATION:  GENERAL:  He is a pleasant middle-aged man in no  acute distress.  VITAL SIGNS:  Blood pressure 138/80, pulse 80 and regular, respirations  18, weight 211 pounds.  NECK:  No jugular venous distention.  LUNGS:  Clear bilaterally to auscultation.  CARDIOVASCULAR:  Regular rate and rhythm with a normal S1 and S2.  EXTREMITIES:  No edema.   EKG demonstrates coarse atrial fibrillation.   IMPRESSION:  1. Atrial flutter, status post ablation.  2. Atrial fibrillation.  3. Coumadin therapy secondary to #2 with left atrial appendage      thrombus, not demonstrated by transesophageal echocardiogram      earlier in May.  4. Hypertension.  5. Chronic Coumadin therapy.   DISCUSSION:  Mr. Merryfield is stable but does have shortness of breath  and heart failure symptoms with exertion.  He has a previous nonischemic  cardiomyopathy with left atrial appendage thrombus and for this reason,  he has now  been on Coumadin therapeutically and sometimes  supratherapeutically for the last four weeks.  The patient unfortunately  has developed some rectal bleeding, which is secondary to hemorrhoids.  For this reason, we have cut back on his dose of Coumadin.  Because of  hopes of maintenance of sinus rhythm, I have recommended that he go on  amiodarone.  I have given him a prescription today to take 40 mg daily.  After one week, we will have him decrease his Toprol to 50 mg daily  instead of 50 twice  daily.  After two weeks, I have asked that he stop his Toprol  altogether.  He may require  reinitiation of this at some point if his blood pressure increases.  If  he does not return to sinus rhythm spontaneously, then I will schedule  DC cardioversion in approximately 2-1/2 to 3 weeks.     Doylene Canning. Ladona Ridgel, MD  Electronically Signed    GWT/MedQ  DD: 01/16/2007  DT: 01/17/2007  Job #: [id number]

## 2010-12-26 NOTE — Assessment & Plan Note (Signed)
[facility]                         ELECTROPHYSIOLOGY OFFICE NOTE   NAME:MCCOLLUMLyncoln, Ledgerwood                      MRN:          [id number]  DATE:07/17/2007                            DOB:          [birth year]    Mr. Stickler returns today for followup.  He is a very pleasant 55-year-  old man with a history of atrial fibrillation as well as hypertension  and atrial flutter.  He has undergone catheter ablation of atrial  flutter in the past but then subsequently developed atrial fibrillation.  He underwent cardioversion back in June and has maintained sinus rhythm  very nicely on fairly high-dose amiodarone at 40 mg daily.  He returns  today for followup.  He denies palpitations.  He denies shortness of  breath or other cardiac symptoms.  He has been maintained on Coumadin.  Prior records suggests that he in fact has WPW syndrome, but this is in  fact not correct.   PHYSICAL EXAMINATION:  GENERAL:  He is a pleasant well-appearing middle-  aged man in no acute distress.  VITAL SIGNS:  Blood pressure today was 160/85, pulse 88 and regular,  respirations 18.  The weight was 217 pounds.  NECK:  No jugular venous distension.  LUNGS:  Clear bilaterally to auscultation.  No wheezing, rhonchi, or  rales were present.  CARDIOVASCULAR:  Regular rate and rhythm with normal S1 and S2.  EXTREMITIES:  No edema.   EKG demonstrates sinus rhythm with LVH.   IMPRESSION:  1. Paroxysmal atrial fibrillation.  2. Atrial flutter status post ablation.  3. Chronic Coumadin therapy.  4. Chronic amiodarone therapy.   DISCUSSION:  Mr. Beckers is stable.  I have asked that he decrease his  dose of amiodarone today to 200 mg daily.  I will see him back in the  office in several months.     Doylene Canning. Ladona Ridgel, MD  Electronically Signed    GWT/MedQ  DD: 07/17/2007  DT: 07/18/2007  Job #: [id number]

## 2010-12-26 NOTE — H&P (Signed)
[name] [name], [name] [name]             ACCOUNT NO.:  [id number]   MEDICAL RECORD NO.:  [id number]          PATIENT TYPE:  EMS   LOCATION:  ED                           FACILITY:  [facility]   PHYSICIAN:  Lucile Crater, MD         DATE OF BIRTH:  Apr 17, 1956   DATE OF ADMISSION:  03/05/2009  DATE OF DISCHARGE:                              HISTORY & PHYSICAL   PRIMARY CARE PHYSICIAN:  Willow Ora, MD   CHIEF COMPLAINT:  Cough.   HISTORY OF PRESENT ILLNESS:  The patient is a 55 year old African  American male with a history of nonischemic cardiomyopathy.  He also had  a history of ACE induced cough who is now on an angiotensin except for  blockers.  He comes to the ER complaining of nonproductive cough which  has been going on for the past seven days.  He denies having any fever  or chills with it.  He denies having any chest pain, but does report  shortness of breath.  He also gets dizzy with this cough.  He had some  blurry vision yesterday, but he did not have any syncopal episodes.  He  had a history of atrial fibrillation and underwent an ablation.  He was  also on amiodarone for some time, and there is suspected amiodarone  induced lung toxins with DNM.  He did not have any sputum production in  the past one week.   As a part of his initial workup, a BMP was obtained which revealed a  blood glucose of greater than 600.  The anion gap was normal on the BMP.  The latest accucheck was 540.  Upon further questioning, the patient  reported that he has been feeling terribly weak for the past 2-3 days,  and there is polyuria, polydipsia and nocturia as well.  He never had  these symptoms before, and he was not diagnosed with diabetes in the  past.  In the ER, he was started on the IV insulin drip per the  protocol.   REVIEW OF SYSTEMS:  A complete review of systems was done which included  General, Head, Eyes, Ears, Nose, Throat, Cardiovascular, Respiratory,  GI/GU, Endocrine, Musculoskeletal,  Neurological, Psychiatric all within  normal limits other than as mentioned above.   PAST MEDICAL HISTORY:  1. Chronic systolic dysfunction.  2. Nonischemic cardiomyopathy with an EF of 30-35%.  3. Nonobstructive coronary artery disease.  4. Chronic renal insufficiency.  5. History of atrial flutter status post radiofrequency catheter      ablation.  6. 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  7. ACE inhibitor induced cough.  8. Cough.  9. Hypertension.  10.Hyperlipidemia.  11.Gout.   ALLERGIES:  CEPHALOSPORINS GIVES RASH.  ACE GIVES COUGH.   CURRENT MEDICATIONS AT HOME:  1. Digoxin 0.25 mg p.o. daily.  2. Atacand 16 mg p.o. daily.  3. Aspirin 81 mg daily.  4. Coumadin 2.5 mg p.o. daily.  5. Coreg 12.5 mg p.o. b.i.d.  6. Lasix 60 mg p.o. b.i.d.  7. Calcium 0.6 mg p.o. daily p.r.n. gout.   SOCIAL HISTORY:  There is no  history of tobacco abuse.  He occasionally  drinks alcohol.  He smokes marijuana occasionally.   FAMILY HISTORY:  Noncontributory.   PHYSICAL EXAMINATION:  VITAL SIGNS:  Blood pressure 164/92, pulse rate  97, respirations 20, O2 saturation 100% on room air, temperature 98.6.  GENERAL:  Not in acute distress, alert and oriented x3, afebrile.  HEENT:  Normocephalic, atraumatic.  Pupils equal and reactive to light  and accommodation.  Extraocular movements intact.  Mucous membranes  moist.  NECK:  Supple.  No JVD, lymphadenopathy or carotid bruits.  CV:  Regular rhythm.  Rate is normal.  No murmurs, rubs or gallops.  LUNGS:  Clear to auscultation bilaterally.  ABDOMEN:  Benign.  EXTREMITIES:  No cyanosis, clubbing or edema.  NEUROLOGICAL:  Grossly nonfocal.   LABORATORY DATA:  WBC 8300, hemoglobin 15.6, hematocrit 46.  INR 1.9.  BMP:  Sodium 131, potassium 3.7, chloride 94, BUN 14, creatinine 1,  blood glucose 621.  CK 311, CK-MB 2.0.  Troponin 0.03.  BNP 94.  Serum  digoxin less than 0.2.  Urinalysis within normal limits.   ASSESSMENT/PLAN:  1.  Hyperglycemia.  The patient has no anion gap on the BMP. Will check      serum acetone to evaluate for any ketosis.Will continue him on the      IV insulin drip per the protocol.Will check a HgBA1C.Most likely      this is a newly diagnosed diabetes mellitus.Will make him NPO and      once his blood sugars are less than 250 we will transition him to a      basal insulin.  2. Cough.  Multiple possibilities including cough secondary to ARB.[name]      Though it is unusual that the ARB can cause cough, it is a      possibility.  The patient also had a history of questionable      amiodarone-induced lung toxicity because of an elevated ESR.  He      might benefit from further evaluation, if this is the case.      Corticosteroids have questionable benefit an amiodarone-induced      lung toxicity.  He would require a pulmonary function test.  If the      DLCO is decreased greater than 15%, it is most likely amiodarone-      induced toxicity.  Will stop the ARB now.  Will give him      Antitussins.  Will supplement oxygen.  3. Nonischemic cardiomyopathy.  The BNP is stable.  Well compensated.      Will continue with his home medications.  4. Hypertension.  Not at goal.  Will titrate his medications.  5. Chronic kidney disease.  Creatinine is normal.  6. Hyperlipidemia.  Will check a fasting lipid panel.  7. Gout.  Colchicine as needed for exacerbations.  8. DVT prophylaxis with subcutaneous injected heparin.  9. Fluids, Electrolytes and Nutrition.  Will replace his electrolytes      as needed.  Now will give him normal saline.  There is no evidence      of heart failure.  He will be made n.p.o. until anion gap closes.      Lucile Crater, MD  Electronically Signed     TA/MEDQ  D:  03/06/2009  T:  03/06/2009  Job:  [id number]

## 2010-12-26 NOTE — Op Note (Signed)
NAMEJAIMIE, REDDITT             ACCOUNT NO.:  [id number]   MEDICAL RECORD NO.:  [id number]          PATIENT TYPE:  OIB   LOCATION:  2899                         FACILITY:  [facility]   PHYSICIAN:  Doylene Canning. Ladona Ridgel, MD    DATE OF BIRTH:  05/03/1956   DATE OF PROCEDURE:  02/07/2007  DATE OF DISCHARGE:                               OPERATIVE REPORT   PROCEDURE PERFORMED:  DC cardioversion.   INDICATIONS:  Symptomatic atrial fibrillation.   INTRODUCTION:  The patient is a 55 year old man with longstanding WPW  syndrome, status post multiple attempts at catheter ablations.  He  essentially developed tachycardia induced cardiomyopathy.  He was  cardioverted approximately 1 year ago back to sinus rhythm, was on  amiodarone and developed recurrent atrial fibrillation and is now  referred for DC cardioversion.  Of note, unlike his previous experience  with atrial fibrillation, he has not had a rapid ventricular response on  amiodarone.   PROCEDURE:  After informed consent was obtained, the patient was prepped  in the usual manner.  Sodium Pentothal 125 mg was delivered under the  direction Dr. Jean Rosenthal with our anesthesia department.  The patient was  DC cardioverted to 200 joules of synchronized biphasic energy, restoring  sinus rhythm.  He tolerated the procedure well.   COMPLICATIONS:  There were no immediate procedure complications.   RESULTS:  This demonstrates successful DC cardioversion in a patient  with symptomatic atrial fibrillation.      Doylene Canning. Ladona Ridgel, MD  Electronically Signed     GWT/MEDQ  D:  02/07/2007  T:  02/07/2007  Job:  [id number]

## 2010-12-26 NOTE — Assessment & Plan Note (Signed)
[name]                         ELECTROPHYSIOLOGY OFFICE NOTE   [name], [name]                      MRN:          [id number]  DATE:04/09/2008                            DOB:          1956-04-16    [name] [name] returns today for followup.  He is a very pleasant middle-  aged male with atrial flutter and nonischemic cardiomyopathy status post  ablation with improvement in his LV function.  He then developed AFib,  has been maintained on sinus rhythm fairly nicely on amiodarone therapy.  He returns today for followup.  He denies chest pain.  He has worsening  dyspnea.  He had complained of some peripheral edema as well as some  gout.  The patient had no other specific complaints today.   His medicines include Coumadin as directed, allopurinol, which he is no  longer taking, Lasix 40 a day, lisinopril 10 a day, and amiodarone 200  twice daily.   PHYSICAL EXAMINATION:  GENERAL:  Physical exam is notable for being a  pleasant well-appearing middle-aged man in no acute distress.  VITAL SIGNS:  The blood pressure was 180/100, the pulse was 100 and  regular, and respirations were 20.  NECK:  Revealed 7-cm jugular venous distention.  LUNGS:  Clear bilaterally except for rales in the bases bilaterally.  There are no wheezes or rhonchi present.  There is no increased work of  breathing.  CARDIOVASCULAR:  Regular tachycardia with a summation gallop.  ABDOMEN:  Soft and nontender.  EXTREMITIES:  Demonstrated trace peripheral edema bilaterally.  His left  arm demonstrated it was tender with erythema around the wrist.   IMPRESSION:  1. Congestive heart failure with predominance of diastolic      dysfunction.  2. Atrial fibrillation, now well controlled on amiodarone.  3. Acute gouty attack.   DISCUSSION:  Today I have recommended [name] [name] start taking  colchicine 0.6 mg every 2 hours until his pain is relieved.  If diarrhea  occurs, we will  also recommended once this is accomplished, take  colchicine 1 tablet daily.  He will also start allopurinol in  approximately 1 week.  They have also asked because of his fluid  overload to increase his Lasix to 2 tablets a day for 4 days and then we  will see the patient back in several weeks.     Doylene Canning. Ladona Ridgel, MD  Electronically Signed    GWT/MedQ  DD: 04/09/2008  DT: 04/10/2008  Job #: [id number]

## 2010-12-26 NOTE — Discharge Summary (Signed)
[name], [name]             ACCOUNT NO.:  [id number]   MEDICAL RECORD NO.:  [id number]          PATIENT TYPE:  INP   LOCATION:  4737                         FACILITY:  [facility]   PHYSICIAN:  Gerrit Friends. Dietrich Pates, MD, FACCDATE OF BIRTH:  02-25-1956   DATE OF ADMISSION:  08/26/2008  DATE OF DISCHARGE:  08/29/2008                               DISCHARGE SUMMARY   CARDIOLOGIST:  Doylene Canning. Ladona Ridgel, MD   PRIMARY CARE PHYSICIAN:  Willow Ora, MD   REASON FOR ADMISSION:  Dyspnea.   DISCHARGE DIAGNOSES:  1. Acute on chronic systolic congestive heart failure, compensated at      discharge.  2. Nonischemic cardiomyopathy with an ejection fraction of 30-35%.  3. Nonobstructive coronary artery disease.  4. Chronic renal insufficiency.  5. History of atrial flutter status post radiofrequency catheter      ablation.  6. Questionable amiodarone lung toxicity based on elevated      sedimentation rate - amiodarone discontinued.      a.     Maintaining sinus rhythm.  7. Questionable ACE inhibitor-induced cough.  8. Hypertension.  9. Dyslipidemia.  10.History of chronically elevated LFTs.  11.Gout.   PROCEDURE PERFORMED THIS ADMISSION:  Cardiac catheterization by Dr.  Marca Ancona on August 27, 2008.  Please see his dictated note for  complete details.  Briefly, ramus intermedius 50% proximal, LAD 30-40%  mid, and EF 30-35%.   ADMISSION HISTORY:  [name] [name] is a 55 year old male with history of  nonischemic cardiomyopathy initially thought to be secondary to  tachycardia in the setting of atrial flutter.  He had catheter ablation  done x2 several years ago and had been followed by Dr. Ladona Ridgel recently  for increasing shortness of breath.  He had an elevated sedimentation  rate.  There were concerns for amiodarone lung toxicity, and his  amiodarone was discontinued.  He is continued to stay in sinus rhythm.  He had his ACE inhibitor discontinued secondary to cough.  He presented  to the  office on June 14, with worsening symptoms and was felt that he  needed inpatient evaluation.   HOSPITAL COURSE:  The patient was admitted and placed on IV Lasix and  his Coumadin was held with plans for right and left heart  catheterization.  His INR was subtherapeutic, and he went for cardiac  catheterization on January 25, 2009.  The results are as noted above.  The  patient tolerated the procedure well and had no immediate complications.  Recommendations were for medical management of his cardiomyopathy and  volume overload.  He had does have a history of chronic renal  insufficiency and he was gently hydrated post catheterization in light  of this.  Over the next 48 hours, he continued to improve.  He was  transitioned over to p.o. Lasix.  His carvedilol dosage was adjusted.  On the morning of January 27, 2009, he was noted to be in stable condition  and felt ready for discharge to home.   LABORATORY DATA:  Hemoglobin 14.4, platelet count 198,000.  At  discharge, INR 1.8 after receiving Coumadin 2.5 mg x2 doses post  catheterization.  Sodium 133, potassium 4, creatinine 1.37, and BUN 15.  Cardiac markers negative x3.  BNP 422.  Chest x-ray on admission, mild  chronic cardiomegaly, no acute abnormalities.   DISCHARGE MEDICATIONS:  1. K-Dur 20 mEq b.i.d.  2. Digoxin 0.25 mg daily.  3. Atacand 16 mg daily.  4. Aspirin 81 mg daily.  5. Coumadin 2.5 mg daily.  6. Coreg 12.5 mg b.i.d.  7. Furosemide 40 mg one and half tablets (60 mg) b.i.d.  8. Colchicine 0.6 mg daily p.r.n. gout.   DIET:  Low-fat, low-sodium diet.   WOUND CARE:  He should call our office for any groin swelling, bleeding,  bruising, or fever.   ACTIVITIES:  He is to increase his activity slowly.  He may walk up  steps.  He may shower.  He may bathe in 1 week.  No lifting or sexual  activity for 3 days.  No driving for 1 day.   FOLLOWUP:  1. He will be seen in the Coumadin Clinic in the next several days to       follow up on his INR.  Our office will contact him with an      appointment.  2. He will be set up with a BMET in 2 week's to evaluate his renal      function and potassium given his ongoing diuretic therapy.  3. He will be set up for followup with Dr. Ladona Ridgel in next 2-3 weeks      and the office will contact him with an appointment.  It should be      noted that the CHF teaching was recommended to the patient at      discharge.  He may benefit from followup in CHF Clinic.  This will      be left up to his primary cardiologist, Dr. Ladona Ridgel.   Total physician and PA time greater than 30 minutes on this discharge.      Tereso Newcomer, PA-C      Gerrit Friends. Dietrich Pates, MD, [facility]  Electronically Signed    SW/MEDQ  D:  08/29/2008  T:  08/29/2008  Job:  [id number]   cc:   Willow Ora, MD

## 2010-12-26 NOTE — Assessment & Plan Note (Signed)
[facility]                         ELECTROPHYSIOLOGY OFFICE NOTE   NAME:MCCOLLUMUsbaldo, Pannone                      MRN:          [id number]  DATE:07/23/2008                            DOB:          [birth year]    Mr. Witman returns today for followup.  He is a very pleasant middle-  aged male with a nonischemic cardiomyopathy (questionable tachycardia  induced) status post flutter ablation.  He has a history of atrial  fibrillation.  He has a history of recurrent heart failure symptoms and  hypertension.  He had hypertensive heart disease.  When I saw him back  in September, he was complaining of worsening shortness of breath and  cough.  At that time, I was concerned about amiodarone contributing to  both and sed rate was over 30.  At the same time, a BNP was in the 200  range.  We stopped his amiodarone, but his dyspnea and cough have not  improved particularly.  His blood pressure has remained elevated.  He  returns today for followup.  He denies chest pain, but does continue to  have persistence of shortness of breath.   Medicines include:  1. Coumadin as directed.  2. Allopurinol daily.  3. Potassium 10 mEq daily.  4. Lisinopril 10 a day.  5. Lasix 80 a day.   PHYSICAL EXAMINATION:  GENERAL:  He is a pleasant middle-aged man in no  distress.  VITAL SIGNS:  Blood pressure was 130/94, the pulse was 100 and regular,  respirations were 18 to 20, temperature of 98.  HEENT:  Normocephalic and atraumatic.  NECK:  A 7-cm jugular venous distention.  LUNGS:  Rales in the bases bilaterally with no wheezes or rhonchi.  CARDIOVASCULAR:  Regular rate and rhythm.  Normal S1 and S2.  There was  a soft S4 gallop.  The PMI was enlarged and laterally displaced.  ABDOMEN:  Soft and nontender.  EXTREMITIES:  No edema   The EKG demonstrates sinus rhythm sinus arrhythmia.   IMPRESSION:  1. Atrial fibrillation with rapid ventricular response.  2. Hypertension.  3. Congestive heart failure, still poorly controlled.   DISCUSSION:  Mr. Jallow's main complaint today is that of cough along  with a shortness of breath.  I am concerned that his ACE inhibitor  (lisinopril) is responsible for some of his cough as discontinuing his  amiodarone did not seem to make an appreciable difference in his cough.  He remains dyspneic.  To this end, my plan today is to stop his  lisinopril and start him on Atacand 16 mg daily.  I have asked that he  increase his Lasix to 80 mg in the morning and 40 mg at  lunch.  I have asked that he start carvedilol 3.125 mg twice daily and  after 10 days increase to  6.25 mg twice daily.  I will see him back in 4 weeks.  Additional  recommendations will depend how he has done since then.     Doylene Canning. Ladona Ridgel, MD  Electronically Signed    GWT/MedQ  DD: 07/23/2008  DT: 07/23/2008  Job #: [id number]

## 2010-12-26 NOTE — Cardiovascular Report (Signed)
[name] [name], [name] [name]             ACCOUNT NO.:  [id number]   MEDICAL RECORD NO.:  [id number]          PATIENT TYPE:  INP   LOCATION:  4737                         FACILITY:  [facility]   PHYSICIAN:  Marca Ancona, MD      DATE OF BIRTH:  [birth year]   DATE OF PROCEDURE:  08/27/2008  DATE OF DISCHARGE:                            CARDIAC CATHETERIZATION   PROCEDURES:  1. Left heart catheterization.  2. Coronary angiography.  3. Left ventriculography.  4. Right heart catheterization.   INDICATION:  This is a 55 year old admitted with dyspnea and congestive  heart failure exacerbation also with possible amiodarone lung toxicity.  Heart catheterization was done to assess right and left heart filling  pressures as well as to assess for coronary artery disease as a cause of  his cardiomyopathy.   PROCEDURE NOTE:  After informed consent was obtained, the right groin  was sterilely prepped and draped.  Lidocaine 1% was used to locally  anesthetized the right groin area.  The right common femoral vein was  accessed using Seldinger technique and a 7-French venous sheath was  placed in the vein.  The right common femoral artery was accessed using  Seldinger technique and a 6-French arterial sheath was placed in the  artery.  The right heart catheterization was carried out using a balloon-  tip Swan-Ganz catheter.  A sample of  blood was taken from the pulmonary  artery to measure mixed venous oxygen saturation, and the sample blood  was taken from femoral artery to measure arterial saturation.  The 6-  [place] multipurpose catheter was used to enter the left ventricle and to  engage the left coronary artery.  The 6-French JR4 catheter was used to  engage the right coronary artery.  There were no complications.   FINDINGS:  Left ventriculogram.  The left ventricle appears enlarged.  EF is moderately decreased, I would estimate it to be 30-35%.  There is  moderate global hypokinesis.  Hand  ventriculogram only was done given  the patient's baseline elevation in creatinine.  Hemodynamics.  Mean right atrial pressure is 10 mmHg, RV 49/10, PA 50/19  for a mean pressure of 34 mmHg, mean pulmonary capillary wedge pressure  21 mmHg, LV 110/8/15.  Aorta 126/89.  Mixed venous oxygen saturation is  57%, Fick cardiac output 4.65 L/min, cardiac index 2.09.  Coronary angiography.  The coronary system was right dominant.  The RCA  had luminal irregularities only.  The left main had no significant  disease.  The circumflex is a small vessel with luminal irregularities.  There is a large ramus intermedius that has diffuse mild disease and a  50% stenosis proximally.  The LAD has diffuse mild-to-moderate luminal  irregularities throughout.  There is a 30-40% stenosis in the mid LAD.   ASSESSMENT AND PLAN:  This is a 55 year old with a history of  nonischemic cardiomyopathy who presented with a congestive heart failure  exacerbation.  Heart catheterization shows moderate global left  ventricular systolic dysfunction, nonobstructive coronary disease and  elevated right and left heart filling pressures.  There is no  obstructive coronary disease that  could explain the patient's  cardiomyopathy.  The plan would be medical management of the patient's  cardiomyopathy and volume overload.  I will give him limited IV fluids  postprocedure given his chronic kidney disease and then in the morning,  we can initiate diuresis.      Marca Ancona, MD  Electronically Signed     DM/MEDQ  D:  08/27/2008  T:  08/28/2008  Job:  [id number]

## 2010-12-26 NOTE — Discharge Summary (Signed)
NAMEASHON, ROSENBERG             ACCOUNT NO.:  [id number]   MEDICAL RECORD NO.:  [id number]          PATIENT TYPE:  INP   LOCATION:  1414                         FACILITY:  [facility]   PHYSICIAN:  Sanda Linger, MD       DATE OF BIRTH:  [birth year]   DATE OF ADMISSION:  03/05/2009  DATE OF DISCHARGE:  03/09/2009                               DISCHARGE SUMMARY   PRIMARY CARE PHYSICIAN:  Dr. Willow Ora.   DISCHARGE DIAGNOSES:  1. New onset severe type 2 diabetes mellitus, insulin-requiring.  2. Cough, which appears to be caused by recent medications including      ACE inhibitors, amiodarone and Norvasc.  3. History of systolic dysfunction, nonischemic cardiomyopathy and      history of atrial flutter status post radiofrequency cardiac      ablation, which has been quiescent during this admission.  He has      continued to be in sinus rhythm.  4. History of chronic renal insufficiency, which appears to be      improved.  5. Hypertension, which has not been well controlled.  6. Recurrent episodes of gouty arthropathy.   RECENT ILLNESS:  This is a 55 year old African American male with a  history of nonischemic cardiomyopathy who was recently had to  discontinue the ACE inhibitor therapy due to a cough, came to the  emergency room complaining of a nonproductive cough that had been going  on for about 7 days.  While he was in the emergency room, he was found  to be hyperglycemic with a blood sugar 621.  He did not have any  evidence of ketoacidosis.  Multiple etiologies were entertained for his  chronic cough as well.  Other elements of his health care appeared to be  well controlled.   LABS ON ADMISSION:  Showed that there was greater than 1000 mg/dL of  glucose in the urine.  There was no protein in the urine and his  microscopic showed only 3-6 red blood cells.  His initial CBC was within  normal limits.  His serum sodium was 131, potassium 3.7, chloride 94,  glucose elevated at 621,  BUN 14, creatinine 1.  His INR was 1.9.  His  cardiac enzymes showed a slight elevation of myoglobin but the troponin  and CK-MB were negative.  His digoxin level was less than 0.2.  His B-  type natriuretic peptide was normal and 94.9.  Repeat sets of cardiac  enzymes within normal limits.  He had an ABG, which showed a pH of 7.507  with a low CO2 at 33, a low O2 at 69.  His hepatic function showed a  slight increase in the total protein, otherwise was within normal  limits.  His ketones were negative.  His urine drug screen was negative.  His sed rate was elevated at 72.  His serum magnesium level was normal  at 2.2.   HOSPITAL COURSE:  He was admitted to the hospital and was held n.p.o.  and placed on some of basal insulin.  He continued to have persistent  problematic cough.  There was consistent  agreement that this was felt to  be due to recent medications with recent Norvasc and ACE inhibitor.  The  elevated sed rate off also favored Norvasc related cough and lung  damage.  His chest x-ray showed basilar prominent increased interstitial  markings with trace pleural fluid and cardiomegaly.  The considerations  were interstitial edema versus atypical viral infection.  Because of  this, a high resolution CT scan was done.  It showed that there was mild  mediastinal lymphadenopathy, which was felt to be due to reactive as  there was no evidence of malignancy.  He had a stable, mild interlobular  septal thickening, similar to a previous CT scan of  May 31 of 2008.  Otherwise, there were no other abnormal findings.  His PFTs were normal  during this admission.  His hemoglobin A1c returned at 10.2 indicating  that he was a new onset, rather severe type 2 diabetes mellitus.  The  patient needed therapy and insulin.  He was started on Janumet and  subcutaneous insulin including Lantus and sliding scale.  He did quite a  bit of diabetic education.  While he was in the hospital, his blood   pressure was managed with the initiation of some hydrochlorothiazide and  titrating up his other medicines.  He was given doses of Tussionex and  his cough consistently improved.  There was no evidence of pneumonia,  infection or pulmonary embolus during this admission.  He did not  receive any consultations.  His only procedures were chest x-ray, high  resolution CT scan, and PFTs.  Physical examination on the day of  discharge shows an alert pleasant male with his only complaint being  some left foot pain, which he attributes to gout.  His cough has  dissipated.  He is resting comfortably and requesting that he go home.   PHYSICAL EXAMINATION:  VITAL SIGNS:  Shows a pulse ox of 97% on room  air, temperature 97.9, pulses 81 and regular, respiratory rate is 16 and  nonlabored, blood pressure is 146/97.  GENERAL:  He is alert and oriented.  LUNGS:  Clear anteriorly and posteriorly.  CARDIOVASCULAR:  Regular rhythm.  ABDOMEN:  Benign.  EXTREMITIES:  Left foot shows it is nontender, nonswollen the skin is  normal.  His pulses are 2+ and equal throughout.  He has no edema in his  extremities.   His most recent CBGs are 271, 207 and 189.  His INR is 1.7.  Most recent  labs done prior to discharge show his sodium is increased to 132 with a  potassium of 3.7, chloride is normal at 101, bicarb normal at 24,  glucose was 378, BUN 8, creatinine 0.86.  His hyponatremia appears to be  dilutional from the hyperglycemia and we can expect it to improve as the  hyperglycemia resolves.  His CBC continues to be within normal limits.   DISCHARGE MEDICATIONS:  1. Tussionex suspension 2.5 five mL p.o. b.i.d. as needed for cough.  2. Kay Ciel 40 mEq once a day.  3. Hydrochlorothiazide 25 mg once a day.  This has been up titrated to      control his blood pressure.  4. Coreg 25 mg twice a day.  This has been up regulated to control his      blood pressure.  5. Janumet 50/500 mg one twice a day for his  diabetes.  6. Lantus Solostar pen 25 units subcutaneously q.h.s..  Meds that he was receiving at the time of  admission that will be  continued are:  1. Lasix 60 mg twice a day.  2. Digoxin 0.25 mg once a day.  3. Atacand 16 mg once a day.  4. Aspirin 81 mg once a day.  5. Coumadin 2.5 mg once a day.  6/  Colchicine 0.6 mg twice a day as needed for gout.   DISCHARGE FOLLOWUP:  His follow-up will be with Dr. Willow Ora, his  primary care physician, in 2 weeks.  He will also continue be seen in  the Coumadin clinic and will follow-up with Dr. Sharrell Ku as well as  his cardiologist.  He will pursue outpatient diabetes education.   Greater than 30 minutes was spent on this patient's discharge, paperwork  and arrangements.      Sanda Linger, MD  Electronically Signed     TJ/MEDQ  D:  03/09/2009  T:  03/09/2009  Job:  [id number]   cc:   Willow Ora, MD  [phone] [place], [place] [address]

## 2010-12-29 NOTE — Discharge Summary (Signed)
[facility]  Patient:    [name] [name], [name] [name]                      MRN: [id number] Adm. Date:  [id number] Disc. Date: 07/09/00 Attending:  Talitha Givens Dictator:   Rozell Searing, P.A.                           Discharge Summary  HISTORY OF PRESENT ILLNESS: [name] is a 55 year old male, with no prior history of heart disease, who presented to the [facility] Emergency Room with dyspnea and was found to be in rapid atrial flutter.  The patient reported a prior "history of heart murmur," but denied having undergone prior evaluation.  The patient was admitted for management of the tachyarrhythmia and further diagnostic evaluation.  LABORATORY DATA: CBC was normal on admission.  INR 1.4 on admission and 2.0 at discharge.  Sodium was 139, potassium 4.0, glucose 129, BUN 17, creatinine 1.2 on admission.  Elevated AST at 99, elevated ALT of 123; otherwise normal liver function tests.  Negative serial cardiac enzymes and troponin I levels. Normal T4 at 7.9, normal TSH at 1.57.  Alcohol level was negative.  Drug screen was positive for cannabinoid and otherwise negative.  Admission chest x-ray showed moderate cardiomegaly, no definite acute infiltrate.  Abdominal ultrasound obtained showed mild generalized liver echogenicity, question of possible hepatocellular disease; no focal mass; small right pleural effusion; no evidence of abdominal ascites.  HOSPITAL COURSE: The patient ruled out out for MI with negative serial cardiac enzymes.  The patient was placed on intravenous diltiazem for management of rate control and IV heparin for anticoagulation.  The abdominal swelling noted on presentation was presumed ascites; however, abdominal ultrasound found no evidence of this.  There was mild elevation of the AST and ALT with remaining liver enzymes normal.  A 2D echocardiogram revealed moderate overall decrease in LV systolic function with  EF of approximately 30-40%.  No focal wall motion abnormalities were noted, with evidence of diffuse LV hypokinesis and basal mid inferior wall akinesis.  There was severe mitral regurgitation noted with mild LAE.  Rate control was achieved with a combination of diltiazem and metoprolol.  Dr. Dietrich Pates noted that the patients cardiomyopathy could be the result of atrial flutter; however, also noted that the severe mitral regurgitation and resultant cardiomyopathy may have, in fact, been the primary event.  He recommended a follow-up echocardiogram in one month.  In the interim the patient was started on Coumadin anticoagulation with plans to proceed with TEE, CCCV.  On the morning of the scheduled procedure, however, the patient spontaneously converted to normal sinus rhythm and remained in normal sinus rhythm at the time of discharge.  Final recommendations by Dr. Andee Lineman at discharge were for the patient to continue on a short course of Lovenox overlapped with early pro time follow-up this week in our Coumadin clinic.  INR at discharge was 2.0.  Additionally the patient will remain on long-acting metoprolol and Cardizem for rate control. Dr. Andee Lineman suspects the patient may require four weeks of Coumadin anticoagulation.  Additional recommendations are to proceed with outpatient stress Cardiolite testing to rule out underlying ischemic heart disease as well as a 24 hour Holter monitor.  The patient also has question of obstructive sleep apnea and we will refer the patient to our pulmonary division for evaluation for a sleep study.  DISCHARGE MEDICATIONS:  1. Lovenox  100 mg q.12h.  2. Coumadin 5 mg q.d.  3. Enteric-coated aspirin 81 mg q.d.  4. Toprol XL 50 mg b.i.d.  5. Cardizem CD 180 mg q.d.  DISCHARGE ACTIVITY: The patient is to refrain from any driving until seen by Dr. Andee Lineman.  SPECIAL INSTRUCTIONS: The patient is to discontinue drinking alcohol.  FOLLOW-UP: The patient  is scheduled to undergo an exercise Cardiolite stress test on Thursday, July 18, 2000, at 12:30.  We will also arrange for him to be placed on a 24 hour Holter monitor.  The patient is instructed to call our Coumadin clinic following discharge to return for early pro time follow-up.  The patient will return to see Dr. Andee Lineman in two to three weeks for cardiac follow-up.  Our office will schedule that appointment.  Arrangements will also be made for the patient to establish with the [place] pulmonary group for evaluation of obstructive sleep apnea.  DISCHARGE DIAGNOSES:  1. Paroxysmal atrial flutter with rapid ventricular response.     a. Converted to normal sinus rhythm after treatment with metoprolol and        diltiazem.  2. Cardiomyopathy.     a. Undetermined etiology.     b. Ejection fraction approximately 30-40%.     c. Severe mitral regurgitation.  3. Coumadin anticoagulation.     a. Lovenox/Coumadin.  4. Alcohol abuse.  5. Mildly elevated liver function enzymes.  6. Substance abuse. DD:  07/09/00 TD:  07/09/00 Job: [id number]

## 2010-12-29 NOTE — Discharge Summary (Signed)
NAMEGORDAN, GRELL NO.:  [id number]   MEDICAL RECORD NO.:  [id number]          PATIENT TYPE:  INP   LOCATION:  3731                         FACILITY:  [facility]   PHYSICIAN:  Luis Abed, MD, FACCDATE OF BIRTH:  10/03/1955   DATE OF ADMISSION:  12/17/2006  DATE OF DISCHARGE:  12/22/2006                               DISCHARGE SUMMARY   This is an addendum to previously-dictated discharge summary early  today, job [id number].   PRIMARY CARDIOLOGIST:  Dr. Gilman Schmidt.   MEDICATIONS:  In the medication list, please change Lopressor to Toprol-  XL 1000 mg 1-1/2 tablets daily.      Nicolasa Ducking, ANP      Luis Abed, MD, [facility]  Electronically Signed    CB/MEDQ  D:  12/22/2006  T:  12/22/2006  Job:  [id number]

## 2010-12-29 NOTE — Discharge Summary (Signed)
[name] [name], [name] [name]             ACCOUNT NO.:  [id number]   MEDICAL RECORD NO.:  [id number]          PATIENT TYPE:  OBV   LOCATION:  2860                         FACILITY:  [facility]   PHYSICIAN:  Doylene Canning. Ladona Ridgel, M.D.  DATE OF BIRTH:  September 29, 1955   DATE OF ADMISSION:  02/01/2006  DATE OF DISCHARGE:  02/01/2006                                 DISCHARGE SUMMARY   ALLERGIES:  He has allergies to CEPHALOSPORINS.   PRINCIPAL DIAGNOSES:  1.  Discharging on the same day as electrophysiology study, radiofrequency      catheter ablation of atrial flutter.  2.  Symptomatic atrial flutter with palpations and exacerbation of his      congestive heart failure.  3.  Finding of large left atrial appendage thrombus with last      hospitalization.  Therefore, unable to proceed with cardioversion or      ablation.  4.  He is now status post 4 weeks of therapeutic Coumadin therapy.   SECONDARY DIAGNOSES:  1.  Nonischemic cardiomyopathy, possibly secondary to tachycardia mediation.  2.  Class II congestive heart failure.  3.  Hypertension.  4.  Coumadin therapy.   PROCEDURE:  February 01, 2006, electrophysiology study, radiofrequency catheter  of atrial fibrillation.  This was a typical counterclockwise tricuspid  annular reentrant atrial flutter.  The patient has had no post procedural  complications, although, he did have a headache both before and after the  procedure.   BRIEF HISTORY:  The patient is a 55 year old gentleman.  He has a history of  nonischemic cardiomyopathy. This may be related to his atrial  tachyarrhythmias.  He was hospitalized several months ago with heart  failure, found to be in atrial flutter.  A rate control was attempted but  cardioversion was unable to be performed because the patient had a large  left atrial appendage thrombus.  He has now been anticoagulated for several  weeks.  He has had no TIA symptoms.  Heart failure is class II.  The patient  has been on digoxin  and Toprol with improvement of symptoms.  He is also on  Coumadin and Lasix.  The patient now presents for electrophysiology study  catheter ablation.   HOSPITAL COURSE:  The patient presented electively on January 30, 2006.  His  INR was found to be too high.  He was asked to come back today, February 01, 2006.  His INR was still elevated at 3.3.  He was given 1 mg IV of vitamin K  and then taken to the electrophysiology lab where the study ws done and the  typical atrial flutter was ablated with a creation of bidirectional isthmus  block, by Dr. Lewayne Bunting.  The patient discharged on the same day on the  following medication regimen:  1.  Coumadin 5 mg Tuesday, Thursday, Saturday, Sunday; 2.5 mg Monday,      Wednesday, Friday.  2.  Altace or lisinopril 10 mg daily.  3.  Allopurinol 100 mg twice daily.  4.  Furosemide 40 mg daily.  5.  Digitek 0.25 mg daily.  6.  K-Dur 10  mEq tablets 30 mEq daily.  7.  Toprol.  In this case it is metoprolol 75 mg a.m., 75 mg p.m.  8.  Colchicine as needed.   FOLLOWUP:  1.  He has follow-up at [facility] [address] in      [place].  2.  Coumadin clinic, Tuesday February 05, 2006 at 2:30.  3.  He will see Dr. Ladona Ridgel Wednesday, February 04, 2006 at 12:30.   LABORATORY STUDIES THIS ADMISSION:  Serum electrolytes 138 sodium, potassium  3.9, chloride 105, carbonate 24, BUN is 9, creatinine 1.2 and glucose 109.  His PT this morning was 33.6 and INR 3.3.  Once again, he received 1 gram of  IV vitamin K.      Maple Mirza, P.A.    ______________________________  Doylene Canning. Ladona Ridgel, M.D.    GM/MEDQ  D:  02/01/2006  T:  02/01/2006  Job:  [id number]   cc:   Doylene Canning. Ladona Ridgel, M.D.  [address]  [address]  [place]  [place] [address]

## 2010-12-29 NOTE — Consult Note (Signed)
[name] [name], [name] [name]             ACCOUNT NO.:  [id number]   MEDICAL RECORD NO.:  [id number]          PATIENT TYPE:  INP   LOCATION:  3731                         FACILITY:  [facility]   PHYSICIAN:  Doylene Canning. Ladona Ridgel, MD    DATE OF BIRTH:  Jun 04, 1956   DATE OF CONSULTATION:  12/18/2006  DATE OF DISCHARGE:                                 CONSULTATION   ELECTROPHYSIOLOGIC CONSULTATION   REQUESTING PHYSICIAN:  Madolyn Frieze. Jens Som, MD   INDICATION FOR CONSULTATION:  Evaluation of atrial flutter.   HISTORY OF PRESENT ILLNESS:  The patient is a very pleasant 55 year old  man with a history of hypertension and typical atrial flutter who is  status post catheter ablation and atrial flutter back in 2007.  At that  time, he was found to have typical counterclockwise tricuspid annular  reentry and atrial flutter and underwent successful ablation.  He saw  him back in February was in sinus rhythm, doing well.  We stopped his  Coumadin therapy.  Over the last 3-4 weeks the patient has noted  increasing progressive dyspnea on exertion, orthopnea, and PND.  He has  not had palpitations or chest pain.  Also several days ago, he noted  that he had bright red blood per rectum, but no melena.  He is now  admitted for additional evaluation.  The patient's past medical history  is, again, notable for hypertension and atrial flutter.  He was on  Coumadin in the past.  He also has a history of gout.   SOCIAL HISTORY:  Notable for occasional alcohol use.  He is a former  cigarette smoker; and he is a former recreational drug user.   FAMILY HISTORY:  Noncontributory.   REVIEW OF SYSTEMS:  Notable for a history of recent GI bleeding as noted  above.  He has a history of fatigue and weakness as noted above in the  HPI.  Otherwise all systems reviewed and negative except for mild  arthritic complaints in his feet and legs.   MEDICATIONS ON ADMISSION INCLUDE:  Toprol, Lasix, lisinopril,  allopurinol, colchicine  as needed.   PHYSICAL EXAM:  GENERAL:  Notable for him being a pleasant well-  appearing middle-aged man in no acute distress.  VITAL SIGNS:  Blood pressure 160/100, pulse 80 and regular, respirations  18, temperature was 98.  HEENT EXAM:  Normocephalic and atraumatic.  Pupils are equal and round.  Oropharynx was moist.  Sclerae were anicteric.  NECK:  Revealed no jugular distension.  There was no thyromegaly.  Trachea was midline.  Carotids were 2+ and symmetric.  LUNGS:  Clear bilaterally to auscultation.  No wheezes, rales, or  rhonchi present.  CARDIOVASCULAR EXAM:  Regular rate and rhythm with normal S1 and S2.  There were no rubs or gallops.  There was a soft systolic murmur at the  left lower sternal border.  The PMI was not enlarged nor laterally  displaced.  ABDOMINAL EXAM:  Soft, nontender, nondistended.  There was no  organomegaly.  The bowel sounds were present.  There was no rebound or  guarding.  EXTREMITIES:  Demonstrated no cyanosis,  clubbing, or edema.  The pulses  were 2+ and symmetric.  NEUROLOGIC EXAM:  Alert and oriented x3 with the cranial nerves intact.  Strength was 5/5 and symmetric.  The EKG demonstrates atypical atrial  flutter.  The flutter waves were positive in V1 and bifid and mostly  positive in lead V-2, 3, and aVF.  The cycle length is 200 milliseconds.  There is typically 4:1 AV conduction.  Occasionally, there is 2:1 AV  conduction.   IMPRESSION:  1. Recurrent atypical atrial flutter.  2. Shortness of breath and fatigue secondary to #1.  3. Recent hematochezia.   RECOMMENDATIONS:  The patient will need be restarted on Coumadin.  Prior  to doing this we will need to have our GI doctors see him and consider a  colonoscopy.  We will plan to start Coumadin, today, as it will take  several days to become therapeutic.  I have discussed left atrial  ablation versus Tikosyn therapy, which I think is most reasonable for  this patient, since he now  appears to be compliant.  I think trying  Tikosyn makes the most sense initially, though we may ultimately  consider ablation.  Prior to the initiation of Tikosyn he would need a  transesophageal echo.      Doylene Canning. Ladona Ridgel, MD  Electronically Signed     GWT/MEDQ  D:  12/18/2006  T:  12/18/2006  Job:  [id number]

## 2010-12-29 NOTE — Discharge Summary (Signed)
[name], SITU NO.:  [id number]   MEDICAL RECORD NO.:  [id number]          PATIENT TYPE:  INP   LOCATION:  3731                         FACILITY:  [facility]   PHYSICIAN:  Luis Abed, MD, FACCDATE OF BIRTH:  Jan 22, 1956   DATE OF ADMISSION:  12/17/2006  DATE OF DISCHARGE:                               DISCHARGE SUMMARY   PRIMARY CARDIOLOGIST:  Dr. Gilman Schmidt.   PRINCIPLE DIAGNOSIS:  Recurrent atrial flutter.   SECONDARY DIAGNOSES:  1. Tachycardia induced non-ischemic cardiomyopathy.  EF 25-35% by TEE      Dec 20, 2006.  2. Small left atrial appendage thrombus by TEE Dec 20, 2006.  3. History of atrial flutter ablation.  4. Chronic systolic congestive heart failure.  5. Hypertension.  6. Bright red blood per rectum status post colonoscopy on this      admission.  7. History of polysubstance abuse.  8. History of medication nonadherence.   ALLERGIES:  Cephalosporins.   PROCEDURES:  Transesophageal echocardiogram.   HISTORY OF PRESENT ILLNESS:  A 55 year old male with prior history of  tachycardia induced non-ischemic cardiomyopathy, as well as atrial  flutter, status post atrial flutter ablation February 01, 2006.  He was last  seen by Dr. Ladona Ridgel in our office July 24, 2006 at which time he was  maintaining sinus rhythm and his Coumadin was discontinued.  He had been  doing well until approximately 3 weeks prior to admission when he began  to experience progressive dyspnea on exertion, orthopnea, and PND.  He  therefore presented to the emergency room on Dec 17, 2006 and was noted  to be in atrial flutter, which was well rate controlled at 75 beats per  minute.  He also reported a 3-day history of hematochezia.  He was  admitted for further evaluation and GI consultation.   HOSPITAL COURSE:  As the patient exhibited recurrent atrial flutter, it  was felt that he would require anticoagulation.  However, because of his  bright red blood per rectum,  GI was consulted and the patient was seen  by Dr. Carman Ching.  A colonoscopy was performed on Dec 19, 2006, which  was negative and it was felt that the bleeding may have been secondary  to an internal hemorrhoid.  At that point, the patient was initiated on  Coumadin therapy after seen by Dr. Gilman Schmidt with a plan for  eventual antiarrhythmic.  As he had been off of Coumadin since December,  he would require TEE prior to antiarrhythmic initiation and this was  performed on Dec 20, 2006 and unfortunately revealed a small thrombus at  the tip of the left atrial appendage.  A decision was made at that point  to put off initiation of antiarrhythmic and/or cardioversion until the  patient was therapeutic on Coumadin for 3 to 4 weeks.  Mr. Arnaud's  INR this afternoon was 2.7 and we plan to discharge him today in  satisfactory condition.  We will arrange for Coumadin clinic followup  early this week and along with followup with Dr. Gilman Schmidt for  additional decision making regarding antiarrhythmics and  plus/minus  cardioversion in approximately 1 month.   DISCHARGE LABS:  Hemoglobin of 15, hematocrit of 45.5, WBC of 5.9,  platelets of 195.  PT is 30.4, INR is 2.7.  Sodium of 137, potassium of  3.7, chloride of 101, CO2 of 29, BUN of 10, creatinine of 1.44, glucose  of 88, calcium of 9.3.  Cardiac enzymes are negative x4 with an  admission BNP of 323.  TSH is 1.323.   DISPOSITION:  The patient is being discharged home today in good  condition.   FOLLOW UP PLANS AND APPOINTMENTS:  We will arrange for Coumadin clinic  followup early this week along with followup with Dr. Ladona Ridgel in  approximately 2 to 3 weeks for further decision making regarding  antiarrhythmics and/or cardioversion.   DISCHARGE MEDICATIONS:  1. Kay Dur 20 mEq daily.  2. Lisinopril 20 mg daily.  3. Allopurinol 100 mg daily.  4. Colchicine 0.6 mg as previously prescribed.  5. Metoprolol 75 mg b.i.d.  6.  Aspirin 81 mg daily.  7. Lasix 40 mg daily.  8. Coumadin 5 mg at bedtime.  The patient is instructed to hold      Coumadin tonight and resume Monday Dec 23, 2007.   OUTSTANDING LABORATORY STUDIES:  None.   DURATION OF DISCHARGE ENCOUNTER:  Forty minutes including physician  time.      Nicolasa Ducking, ANP      Luis Abed, MD, [facility]  Electronically Signed    CB/MEDQ  D:  12/22/2006  T:  12/22/2006  Job:  [id number]   cc:   Fayrene Fearing L. Malon Kindle., M.D.  Willow Ora, MD

## 2010-12-29 NOTE — Assessment & Plan Note (Signed)
[facility]                         ELECTROPHYSIOLOGY OFFICE NOTE   NAME:MCCOLLUMJovonta, Levit                      MRN:          [id number]  DATE:07/24/2006                            DOB:          15-Dec-1955    Mr. Gilham returns today for follow up.  He is a very pleasant middle-  aged man with a tachycardia-induced cardiomyopathy secondary to atrial  flutter who underwent electrophysiology study and catheter ablation.  He  returns today for follow up.   The patient is doing well.  He denies chest pain or shortness of breath  and has had no palpitations.  He has been maintained on Coumadin.  On  exam, he is a pleasant, well-appearing middle-aged man in no acute  distress.  The blood pressure was 122/64.  The pulse was 80 and regular.  The respirations were 18.  The neck revealed no jugular venous  distention.  Lungs were clear bilaterally to auscultation.  There were  no wheezes, rales or rhonchi.  The cardiovascular exam revealed a  regular rate and rhythm with normal S1-S2.  There is a soft S4 gallop  present.  The extremities demonstrated on clubbing, cyanosis, or edema.  The pulses were 2+ and symmetric.   His medications include Coumadin as directed, Altace, allopurinol,  Lasix, digoxin, potassium and Toprol.   IMPRESSION:  1. Atrial flutter, status post ablation now maintaining sinus rhythm.  2. Congestive heart failure secondary to a tachycardia-induced      cardiomyopathy.  3. History of gout.   DISCUSSION:  Today, I have asked Mr. Helfman to discontinue his digoxin  and Coumadin as he has maintained sinus rhythm nicely.  I have also  asked that we decrease his Lasix to 20 mg daily.  I will plan to see the  patient back in the office in approximately 6-12 months.     Doylene Canning. Ladona Ridgel, MD  Electronically Signed    GWT/MedQ  DD: 07/24/2006  DT: 07/24/2006  Job #: [id number]

## 2010-12-29 NOTE — Consult Note (Signed)
[name] [name], [name] [name]             ACCOUNT NO.:  [id number]   MEDICAL RECORD NO.:  [id number]          [name] TYPE:  INP   LOCATION:  3731                         FACILITY:  [facility]   PHYSICIAN:  James L. Randa Evens, M.D. DATE OF BIRTH:  1955/08/23   DATE OF CONSULTATION:  12/18/2006  DATE OF DISCHARGE:                                 CONSULTATION   REASON FOR CONSULTATION:  Dr. Olga [place] asked Dr. Randa Evens to see  [name] [name] in consultation for bright red blood per rectum and  [name] need to evaluate for a possible GI bleed given [name] need to restart  Coumadin.   HISTORY OF PRESENT ILLNESS:  This is a 55 year old African-American male  with recurrent atrial flutter who is complaining of small amounts of  bright red blood per rectum streaked in his stool and on his toilet  tissue last Sunday and Monday.  He has had no bleeding since Monday.  He  does have regular bowel movements and has noticed no change in them.  They are easily passed, although he says he does occasionally have to  strain.  He reports no hematemesis or melena.  He does not take any  NSAIDs.  Although he has been prescribed an aspirin a day, he does not  take it.  He complains of occasional acid indigestion but no dysphagia.  He does not take any medications to treat his indigestion.  He complains  of upper abdominal pain that he says felt like a firmness going across  his upper abdominal area that has now resolved since restarting Lasix.  He also complains of increasing dyspnea on exertion.   PAST MEDICAL HISTORY:  Past medical history is significant for  nonischemic cardiomyopathy with an ejection fraction of 40-45% in 2003  as well as hypertension, status post atrial ectopic ablation as well as  substance abuse.  GI history: He received a colonoscopy by Dr. Kinnie Scales in  2002, two hyperplastic polyps were moved; he also had an EGD with Dr.  Kinnie Scales in 2002 which was found to be normal.   CURRENT MEDICATIONS  INCLUDE:  Lisinopril, allopurinol, Toprol, he was  also prescribed Lasix, as well as a daily aspirin which he was not  taking previously before coming into [name] hospital.   ALLERGIES:  CEPHALOSPORINS.   FAMILY HISTORY:  Significant for no GI cancers.   SOCIAL HISTORY:  He reports that he drinks approximately two to three  alcoholic beverages per week and occasionally smokes marijuana.   REVIEW OF SYSTEMS:  Negative for weight loss.  Negative for fever.   PHYSICAL EXAMINATION:  VITAL SIGNS:  His vital signs are 97.5  temperature, pulse is 75, respirations are 18, blood pressure is  elevated at 145/93.  GENERAL:  He is alert and oriented.  He is in no apparent distress.  He  is pleasant to speak with and is accompanied by his wife.  CHEST:  His heart has a regular rate and rhythm.  His lungs are clear to  auscultation bilaterally.  ABDOMEN:  His abdomen is soft and nontender with good bowel sounds.  He  does  have mild distention.  RECTAL:  He is positive for non-thrombosed external hemorrhoids that are  not actively bleeding.   CURRENT LABORATORIES:  Hemoglobin of 13.9 that is a drop from 16 when he  was first admitted, white count 6.3, hematocrit 42.1, platelets 181,000.  Chem-7 shows sodium 137, potassium 3.4, chloride 105, bicarb 25, BUN is  12, creatinine 1.21, and glucose 132.   ASSESSMENT:  Dr. Carman Ching has seen and examined [name] [name] and  collected a history.  He states that [name] [name]'s bleeding has now  resolved, possibly it was due to hemorrhoids.  We will perform a  colonoscopy on Dec 19, 2006, at 9:30 a.m. to rule out any  gastrointestinal bleeding as well as to evaluate for polyps since his  last colonoscopy was 6 years ago.  Please hold Coumadin until after [name]  colonoscopy is completed.  We called Dr. Olga [place] who says that  [name] [name] was fit to have a colonoscopy.  [name] procedure was discussed  with [name] [name] and his wife and he agrees to  proceed.      Stephani Police, PA    ______________________________  Llana Aliment Randa Evens, M.D.    MLY/MEDQ  D:  12/18/2006  T:  12/18/2006  Job:  [id number]   cc:   Madolyn Frieze. Jens Som, MD, [facility]

## 2010-12-29 NOTE — Op Note (Signed)
NAMESHANDON, [name] [name]             ACCOUNT NO.:  [id number]   MEDICAL RECORD NO.:  [id number]          PATIENT TYPE:  OIB   LOCATION:  2899                         FACILITY:  [facility]   PHYSICIAN:  Doylene Canning. Ladona Ridgel, M.D.  DATE OF BIRTH:  1956-04-06   DATE OF PROCEDURE:  02/01/2006  DATE OF DISCHARGE:                                 OPERATIVE REPORT   PROCEDURE PERFORMED:  Electrophysiologic study and RF catheter ablation of  atrial flutter.   HISTORY OF PRESENT ILLNESS:  The patient is a very pleasant 55 year old male  with a history of hypertension, congestive heart failure secondary to  diastolic dysfunction and atrial flutter who is now admitted for catheter  ablation.   PROCEDURE:  After informed consent was obtained, the patient was taken to  the diagnostic EP lab in the fasted state.  After the usual preparation and  draping, intravenous fentanyl and midazolam was given for sedation.  A 6-  [place] hexapolar catheter was inserted percutaneously in the right jugular  vein and advanced to the coronary sinus.  A 5-French quadripolar catheter  was inserted percutaneously in the right femoral vein and advanced the right  atrium.  A 7-French 20 polar Halo catheter was inserted percutaneously in  the right femoral vein and advanced to the right atrium.  Of note, the 5-  [place] catheter was actually placed in Hiss bundle region.  Mapping was  subsequently carried out demonstrating typical counterclockwise tricuspid  annular reentrant atrial flutter.  Four RF energy applications were  subsequently delivered to the usual atrial flutter isthmus between the  tricuspid valve annulus and the station ridge resulting in termination of  atrial flutter, restoration of sinus rhythm, and creation of bidirectional  block and atrial flutter isthmus.  The patient was observed for 30 minutes  and during this time, rapid ventricular pacing was carried out from the RV  apex but with the ablation catheter,  demonstrating VA dissociation.  Programmed ventricular stimulation was carried out from the RV apex  demonstrating VA dissociation at 600 milliseconds.  Rapid atrial pacing was  carried out from the coronary sinuses as well as the high right atrium and  pacing cycle lengths of 600 milliseconds and stepwise decreased down to 470  milliseconds where AV Wenckebach was observed.  During rapid atrial pacing,  the PR interval was greater than the RR interval and there was no inducible  SVT.  Finally programmed atrial stimulation was carried out from the  coronary sinus in the high right atrium and baseline cycle length of 600  milliseconds.  The S1, S2 interval was stepwise decreased down to 440  milliseconds where the AV node ERP was observed.  During programmed atrial  stimulation, there were no H-jumps, no echo beats and no inducible SVT.  At  this point,  the catheters were removed, hemostasis was assured and the  patient was returned to his room in satisfactory condition.   COMPLICATIONS:  There were no immediate procedure complications.   RESULTS:  A.  Baseline ECG.  Baseline ECG demonstrates atrial flutter with a  variable AV conduction.  B.  Baseline intervals.  The atrial flutter cycle length was 260  milliseconds, the HV interval was 35 milliseconds.  QRS duration 112  milliseconds.  C.  Rapid ventricular pacing.  Rapid atrial pacing was carried out following  ablation demonstrating VA dissociation at 600 milliseconds.  D.  Programmed ventricular stimulation.  Programmed ventricular  stimulation  was carried out following catheter ablation at a baseline cycle length of  600 milliseconds.  This demonstrated VA dissociation.  E. Rapid atrial pacing. Rapid atrial pacing was carried out from the  coronary sinus as well as the high right atrium with a pacing cycle length  of 600 milliseconds and stepwise decreased down to 470 milliseconds where AV  Wenckebach was observed.  During rapid  atrial pacing, the PR interval was  less than the RR interval and there was no inducible SVT.  F.  Programmed atrial stimulation. Programmed atrial stimulation was carried  out from the coronary sinus as well as the high right atrium, baseline cycle  length of 600 milliseconds.  The S1, S2 interval was stepwise decreased down  to 440 milliseconds where the AV node ERP was observed.  During programmed  atrial stimulation, there were no H-jumps, no echo beats, no inducible SVT.  G.  Arrhythmias observed.  1.  Atrial flutter initiation at present time of EP study.  Duration      sustained ,cycle length 259 milliseconds. Method of termination was with      catheter ablation.      1.  Mapping.  Mapping of atrial flutter demonstrated typical          counterclockwise tricuspid annular reentrant and atrial flutter.          1.  RF energy application.  A total of four RF energy applications              were delivered resulting in termination of atrial flutter,              restoration of sinus rhythm, and creation of bidirectional block              in the atrial flutter isthmus.   CONCLUSIONS:  This study demonstrates successful electrophysiologic study  and RF catheter ablation of typical atrial flutter with a total four RF  energy applications delivered in the usual atrial flutter isthmus.  There  were June 21, 2007no immediate procedure complications.           ______________________________  Doylene Canning. Ladona Ridgel, M.D.     GWT/MEDQ  D:  02/01/2006  T:  02/01/2006  Job:  [id number]   cc:   Willa Rough, M.D.  [address]  [address]  [place]  [place] [address]

## 2010-12-29 NOTE — H&P (Signed)
NAMEMATHIAS, BOGACKI             ACCOUNT NO.:  [id number]   MEDICAL RECORD NO.:  [id number]          PATIENT TYPE:  INP   LOCATION:  4729                         FACILITY:  [facility]   PHYSICIAN:  Olga [place], M.D. LHCDATE OF BIRTH:  1955/12/03   DATE OF ADMISSION:  11/06/2005  DATE OF DISCHARGE:                                HISTORY & PHYSICAL   PRIMARY CARDIOLOGIST:  Dr. Andee Lineman and Dr. Jerral Bonito   PRIMARY CARE PHYSICIAN:  Dr. Drue Novel   Mr. Epple is a 55 year old African-American gentleman with known history  of atrial flutter, non-ischemic cardiomyopathy with class II CHF who  presents to [facility] Emergency Room on this date complaining of shortness  of breath.  Mr. Sayegh states he has been short of breath approximately  two weeks; however, it has become more pronounced.  He also states his heart  is out of rhythm and that has been going on for two weeks.  Mr. Makara  states he normally feels good; however, he first noticed his breathing was  becoming a little more labored and then he began having some abdominal  tightness and he states he can feel the fluid moving in his chest.  Over the  last two night he has become more short of breath with orthopnea and  diaphoresis.  He states he saw Dr. Myrtis Ser last week.  There is no office note  available at this time.  The last office note was from October 18, 2005 at  which time patient saw Gene Serpe for exertional symptoms of dyspnea.  Patient was noted to have an irregular heart rhythm at that time with an EKG  showing atrial flutter with variable conduction at 90 beats per minute.  It  was documented at that time that consideration would be given for EP consult  for consideration of radiofrequency ablation.  Mr. Dobrowski is interested in  pursuing the ablation at this time.  Per patient report patient states he  was to be scheduled for a cardioversion in four weeks after INR therapeutic.  However, his dyspnea worsened and he  decided to present to the emergency  room for evaluation.   ALLERGIES:  Patient states he is allergic to antibiotic but he is not able  to recall which one.   CURRENT MEDICATIONS:  1.  Coumadin.  2.  Altace 10 mg.  3.  Toprol XL 50 mg p.o. b.i.d.  4.  Lasix 20 mg daily.  5.  Colchicine.  6.  Allopurinol.  7.  Patient states he is supposed to be taking aspirin but he has not been      compliant with this.   PAST MEDICAL HISTORY:  1.  Surgery for osteochondroma in the remote past.  2.  Paroxysmal atrial flutter previously treated with IV medications and      previously treated with DCCV in 2005.  3.  Class II CHF/non-ischemic cardiomyopathy, EF of 30-40% in the past.  4.  Severe mitral regurgitation.  5.  Medication noncompliance.  6.  History of ETOH abuse.  7.  History of substance abuse.  8.  Hypertension.  9.  History of increased LFTs.  10. History of hypercholesterolemia.  11. History of non-obstructive CAD by cardiac catheterization in 2003 at      which time patient had an EF of 40-45%.   SOCIAL HISTORY:  Patient lives with his wife.  He is a Scientist, water quality.  He  states he quit smoking approximately 18 years ago.  He also states he quit  drinking several years ago.  However, he occasionally has a beer.  Diet is  regular.  Drug use:  History of cocaine, marijuana use.  Last used three  days ago.   FAMILY HISTORY:  Denies any family history of CAD.   REVIEW OF SYSTEMS:  Positive for sweats, shortness of breath, dyspnea on  exertion, orthopnea, occasional edema, occasional fluttering in chest, and  chronic cough.  Also complains of some right hip pain.  All other systems  negative per patient.   PHYSICAL EXAMINATION:  VITAL SIGNS:  Pulse is 92, respirations 20, blood  pressure initially was 190/128 and then 145/105.  Patient saturating 100% on  2 L.  GENERAL:  He is in no acute distress.  HEENT:  Normocephalic, atraumatic.  Sclera is clear.  NECK:  Supple without  lymphadenopathy.  Negative bruit or JVD.  CARDIOVASCULAR:  Irregular rhythm.  LUNGS:  Fine crackles in bilateral bases.  ABDOMEN:  Soft, nontender.  Positive bowel sounds.  EXTREMITIES:  Without clubbing, cyanosis, edema.  He has 2+ DPs bilateral.  NEUROLOGIC:  He is alert and oriented x3.  Cranial nerves II-XII grossly  intact.   Chest x-ray at this time is pending.  EKG at a rate of 79, atrial flutter.   LABORATORIES:  WBC 5.8, hemoglobin 13.6, hematocrit 41.3 with a platelet  count of 224,000.  Sodium 139, potassium 3.8, chloride 105, CO2 27, BUN 12,  creatinine 1.3.  AST 61, ALT 62.  Point of care troponin less than 0.05 x2  with a BNP of 220.  PT 33.9 with INR 3.3.   Dr. Olga [place] in to examine and assess patient with known history of  atrial flutter.   PLAN:  Admit.  Increase his Lasix to 40 mg IV b.i.d., follow renal function  closely.  We will check an echocardiogram and a TSH.  We will go ahead and  proceed with a TEE cardioversion for symptomatic relief.  Plan an outpatient  EP evaluation for atrial flutter ablation.  Discontinue substance abuse.      Dorian Pod, NP    ______________________________  Olga [place], M.D. [facility]    MB/MEDQ  D:  11/06/2005  T:  11/07/2005  Job:  [id number]

## 2010-12-29 NOTE — Discharge Summary (Signed)
NAMEZARIF, RATHJE             ACCOUNT NO.:  [id number]   MEDICAL RECORD NO.:  [id number]          PATIENT TYPE:  OIB   LOCATION:  2899                         FACILITY:  [facility]   PHYSICIAN:  Carolanne Grumbling, M.D.    DATE OF BIRTH:  1956/01/14   DATE OF ADMISSION:  01/30/2006  DATE OF DISCHARGE:  01/30/2006                                 DISCHARGE SUMMARY   PRINCIPAL DIAGNOSES:  1.  Atrial flutter with palpitations.  2.  Tachyarrhythmia exacerbating congestive heart failure.  3.  Nonischemic cardiomyopathy.  4.  Needs radiofrequency catheter ablation, but postponed in March secondary      to a large left atrial appendage thrombus.  5.  Appropriate anticoagulation, April and May 2007.  6.  Presented as scheduled January 30, 2006, with INR of 4.4.  INR in the      office, January 29, 2006, was 5.4.  The patient is on Coumadin 2.5 mg      Monday, Wednesday, Friday, 5 mg all other days.  His INR has been      elevated on this dose.  Back in May, it was 3.6.   DISCUSSION:  The patient had been contacted on January 29, 2006, about his  supratherapeutic INR.  Apparently, he did not receive the message and  presented this morning at 6:00 for electrophysiology study and  radiofrequency catheter ablation.  INR on January 30, 2006, the day of this  presentation, is 4.4.  His potassium was also low today.  It was 3.2.  It  was 3.3 on January 29, 2006.  His dose of potassium is 10 mg.  His Lasix dose  is 40 mg daily.  The plan per Dr. Ladona Ridgel, who saw this patient, was to  return for rescheduling of electrophysiology study and radiofrequency  catheter ablation Friday, February 01, 2006.  He is to hold Coumadin January 30, 2006, and January 31, 2006.  The patient was given 60 mEq of potassium prior to  discharge today and a prescription for 40 mEq of potassium to compliment his  40 mg of furosemide.  He will present February 01, 2006.  He will have a repeat  BMET, basic metabolic panel, and a repeat pro time/INR.   MEDICATIONS:  His other medications include:  1.  Altace 10 mg daily.  2.  Allopurinol 100 mg daily.  3.  Furosemide 40 mg daily.  4.  Digoxin 0.25 mg daily.  5.  Toprol XL 50 mg twice daily.  6.  Colchicine is needed.  7.  New dose of potassium 40 mEq daily.   This preparation and discharge took 15 minutes.      Maple Mirza, P.A.      Carolanne Grumbling, M.D.  Electronically Signed    GM/MEDQ  D:  01/30/2006  T:  01/30/2006  Job:  [id number]

## 2010-12-29 NOTE — Op Note (Signed)
NAMEHARALD, [name] [name]             ACCOUNT NO.:  [id number]   MEDICAL RECORD NO.:  [id number]          PATIENT TYPE:  INP   LOCATION:  3731                         FACILITY:  [facility]   PHYSICIAN:  James L. Malon Kindle., M.D.DATE OF BIRTH:  10/20/1955   DATE OF PROCEDURE:  12/19/2006  DATE OF DISCHARGE:                               OPERATIVE REPORT   PROCEDURE:  Colonoscopy.   MEDICATIONS:  Fentanyl 75 mcg and Versed 6 mg IV.   SCOPE:  Adult colonoscope.   INDICATIONS:  The patient with rectal bleeding.  He has had atrial fib.  Needs to be on Coumadin.  He had a previous history of adenomatous  polyps removed a number of years ago by another gastroenterologist.   DESCRIPTION OF PROCEDURE:  Procedure explained to the patient and  consent obtained.  In the left lateral decubitus position, a Pentax  adult scope was inserted.  The prep was excellent.  Were able to reach  the cecum without difficulty, ileocecal valve and appendiceal orifice  seen.  The scope was withdrawn.  The cecum, ascending, transverse,  descending, and sigmoid colon were seen well.  No diverticular disease,  no polyps, no source of bleeding.  In the rectum, the patient was seen  to have internal hemorrhoids.  The scope was withdrawn.  The patient  tolerated the procedure well.   ASSESSMENT:  1. Rectal bleeding, probably due to internal hemorrhoids.  2. History of colon polyps, negative colonoscopy at this time.   PLAN:  Will recommend a 5-year repeat procedure and I have given him  hemorrhoid instructions.  Would recommend daily MiraLax.           ______________________________  Llana Aliment. Malon Kindle., M.D.     Waldron Session  D:  12/19/2006  T:  12/19/2006  Job:  [id number]   cc:   Doylene Canning. Ladona Ridgel, MD

## 2010-12-29 NOTE — Discharge Summary (Signed)
NAMECANON, GOLA             ACCOUNT NO.:  [id number]   MEDICAL RECORD NO.:  [id number]          PATIENT TYPE:  INP   LOCATION:  4729                         FACILITY:  [facility]   PHYSICIAN:  Willa Rough, M.D.     DATE OF BIRTH:  Dec 17, 1955   DATE OF ADMISSION:  11/06/2005  DATE OF DISCHARGE:  11/08/2005                                 DISCHARGE SUMMARY   PRIMARY CARDIOLOGIST:  Dr. Willa Rough ([place])   PRINCIPAL DIAGNOSES:  1.  Recurrent paroxysmal atrial flutter with controlled ventricular      response.  2.  Large left atrial appendage thrombus.      1.  Bi-transesophageal echocardiogram.  3.  Non-ischemic cardiomyopathy.      1.  Ejection fraction 25-35 percent, this admission.  4.  Chronic Coumadin.  5.  Congestive heart failure.  6.  Cocaine use.  7.  Mildly elevated liver enzymes.  8.  Mild hypoalbuminemia.   SECONDARY DIAGNOSES:  1.  Non-obstructive coronary artery disease.      1.  Cardiac catheterization in April 2003.  2.  Hypertension.  3.  History of polysubstance abuse.  4.  Mitral regurgitation.  5.  History of medication non-compliance.   PROCEDURES:  Aborted TE-cardioversion secondary to large left atrial  appendage thrombus, March 28.   REASON FOR ADMISSION:  [name] [name] is a 55 year old male, with a history of  recurrent paroxysmal atrial flutter, who is status post successful DC-  cardioversion in March 2005 as well as history of spontaneous conversion to  a normal sinus rhythm, who had been recently seen in our office with  recurrent, symptomatic atrial flutter.  He was felt to have an ablated  rhythm and recommendations at that time were to arrange a followup with one  of our electrophysiologists for consideration of possible RF ablation.   The patient presented to the hospital, however, with a progressive dyspnea,  orthopnea, and abdominal fullness.  He was found to be in atrial flutter  with a controlled ventricular response and congestive  heart failure by chest  x-ray.   HOSPITAL COURSE:  The patient was placed on an aggressive diuretic regimen  of Lasix 40 mg I.V. twice a day, for treatment of congestive heart failure  both by clinical presentation as well as by radiographic evidence.   The patient was therapeutic on admission (INR 3.3) and 3.0 at time of  discharge.   The patient was referred for T-cardioversion the following morning.  However, he was found to have a large left atrial appendage thrombus as well  as bi-atrial enlargement with __________.  There was also mild/moderate  mitral regurgitation.  Left ventricular function had worsened from prior  studies, currently 25-35 percent.   Cardioversion was aborted and recommendation is to continue Coumadin  anticoagulation for 4 weeks with a re-attempt at T-cardioversion if INR  remains therapeutic (2.5-3.0).  Additionally, we will arrange for the  patient to establish with one of our electrophysiologists for consideration  of possible radiofrequency ablation.   MEDICATION ADJUSTMENTS:  Addition of digoxin for inotropic and rate control  benefit, up titration of  Lasix, addition of supplemental potassium.   DISCHARGE LABS:  INR 3.0, sodium 130, potassium 3.5, glucose 108, BUN 10,  creatinine 1.3.  Outstanding labs negative.  Cardiac markers:  VNT 220 on  admission.  TSH 0.966.  Urine drug screen:  Positive for cocaine and  cannabinoids.  Urinalysis negative.  Potassium 3.8, BUN 12, creatinine 1.3  on admission.  Elevated AST 61.  Elevated ALT 62.  CBC normal on admission.   Admission chest x-ray:  Congestive heart failure/mild interstitial pulmonary  edema.   DISCHARGE MEDICATIONS:  Digoxin 0.25 daily; Toprol-XL 50 mg twice a day;  Altace 10 mg daily; Coumadin 2.5/5 alternating daily; Lasix 40 mg daily; K-  Dur 10 mEq daily; allopurinol as previously directed; colchicine as  previously directed.   INSTRUCTIONS:  1.  The patient is scheduled for followup  BMET and digoxin level at Dr.      Henrietta Hoover office on Monday, April 2, at 12:30 p.m.  This is followed by a      ProTime check in our clinic at 12:30 that same day.   FOLLOW UP:  1.  Dr. Willa Rough on April 3, at 3:30 p.m.  2.  Dr. Lewayne Bunting on May 4, at 12:30 p.m.   DISPOSITION:  Stable.   DISCHARGE DURATION TIME:  45 minutes.      Gene Serpe, P.A. [facility]    ______________________________  Willa Rough, M.D.    GS/MEDQ  D:  11/08/2005  T:  11/09/2005  Job:  [id number]   cc:   Wanda Plump, MD [facility]  [phone] [address] [place], [place] [address]

## 2010-12-29 NOTE — H&P (Signed)
NAMEKAHNE, HELFAND             ACCOUNT NO.:  [id number]   MEDICAL RECORD NO.:  [id number]          PATIENT TYPE:  EMS   LOCATION:  MAJO                         FACILITY:  [facility]   PHYSICIAN:  Madolyn Frieze. Jens Som, MD, FACCDATE OF BIRTH:  [birth year]   DATE OF ADMISSION:  12/17/2006  DATE OF DISCHARGE:                              HISTORY & PHYSICAL   Mr. Brogden is a 55 year old male with past medical history of  nonischemic cardiomyopathy, atrial flutter status post ablation,  hypertension, hyperlipidemia, substance abuse, who we are asked to  evaluate for atrial flutter and dyspnea.  The patient's cardiac history  dates back to April 2003.  At that time he underwent cardiac  catheterization secondary to cardiomyopathy and dyspnea.  He was found  to have a 25% LAD and 50% small first diagonal.  He also had a proximal  30-40% right coronary artery.  Ejection fraction was 40-45% and it was  felt that this was nonischemic cardiomyopathy.  He was treated  medically.  In March 2007, he was  scheduled for atrial flutter ablation  but was found to have left atrial pannus thrombus by TEE.  He  subsequently underwent atrial flutter ablation on 02/01/2006.  He was  last seen in the cardiology office by Dr. Ladona Ridgel on 07/24/2006.  At that  time, he was in sinus rhythm and his digoxin and Coumadin were  discontinued.  Over the past 3 weeks, he complains of progressive  dyspnea on exertion, orthopnea and PND.  There has been no pedal edema,  palpitations or chest pain.  He came to the emergency room today and was  found to be in recurrent atrial flutter.  Cardiology is asked to further  evaluate.  Also note he has had three episodes of hematochezia over the  past 3 days.  There has been no melena.   ALLERGIES:  He has an allergy to CEPHALOSPORINS.   MEDICATIONS:  Include aspirin daily, lisinopril 10 mg p.o. daily,  allopurinol 100 mg p.o. b.i.d., Toprol 75 mg p.o. b.i.d.  He is supposed  to be  on Lasix, but this has been discontinued.   SOCIAL HISTORY:  He does not smoke but does occasionally consume  alcohol.  He also uses marijuana but denies any cocaine use.   FAMILY HISTORY:  Is negative for coronary disease.   PAST MEDICAL HISTORY:  Significant for hypertension, hyperlipidemia.  There is no diabetes mellitus.  There is no previous thyroid issues.  He  does have a history of gout.  He has a history of hematochezia and polyp  removal approximately 2 years ago by his report.  I do not have those  records available.  He has had previous atrial flutter ablation also has  a nonischemic cardiomyopathy.  He also has a history of substance abuse.  He has had a prior left shoulder surgery.   REVIEW OF SYSTEMS:  He denies any headaches, fevers or chills.  There is  no productive cough or hemoptysis.  There is no dysphagia, odynophagia.  There is no melena.  He has had hematochezia over the past 3 days.  When  questioned about this further, he describes it as possibly a teaspoon in  the toilet and also blood on his tissue after having a bowel movement.  There is no dysuria, hematuria.  There is no rash or seizure activity.  There is orthopnea, PND, but there is no pedal edema.  There is no  claudication noted.  Remaining systems are negative.   PHYSICAL EXAM:  His pulse is 70-80.  His blood pressure 130/70.  He is  well-developed, well-nourished and does not appear to be acute distress.  Skin is warm and dry.  He is not deeply depressed.  There is no  peripheral clubbing.  His back is normal.  HEENT:  Normal with normal  eyelids.  NECK:  Supple with a normal upstroke bilaterally.  No bruits noted.  There is no jugular distension.  I cannot appreciate thyromegaly.  CHEST:  Clear to auscultation.  Normal expansion.  CARDIOVASCULAR:  Reveals a regular rate and rhythm.  Normal S1 and S2.  There are no murmurs or rubs noted.  There is an S3.  ABDOMEN:  Nontender, nondistended and  positive bowel sounds.  No  hepatosplenomegaly.  No mass appreciated.  There is no abdominal bruit.  EXTREMITIES:  He is 2+ femoral pulses bilaterally.  No bruits.  Extremities show no edema.  I can palpate  no cords.  He has  2+  dorsalis pedis pulses bilaterally.  NEUROLOGICAL EXAM:  Grossly intact.   His electrocardiogram shows atrial flutter at a rate of 75.  The axis is  normal.  There is left ventricle hypertrophy with minor repolarization  abnormalities.  His white blood cell count is 6.4 with a hemoglobin of 14.4, hematocrit  42.3.  His platelet count is 176.  His INR is 1.2.  Potassium is 3.6.  His BNP is 323.  His stool Hemoccult is negative.   DIAGNOSES:  1. Recurrent atrial flutter - the patient will be admitted and his      Toprol will be continued for rate control.  I will ask the      gastroenterologist to evaluate the patient for his recent      hematochezia prior to initiating Coumadin.  Once he has been      cleared then we will be begin with a goal INR two to three.  We      will then proceed with a transesophageal echocardiogram to exclude      left atrial pannus thrombus.  If it is negative then we will have      the electrophysiologist evaluate the patient for possible repeat      ablation.  He is symptomatic with this.  2. Dyspnea - most likely secondary to mild volume overload due to his      recurrent atrial flutter.  We will check an echocardiogram to      quantify his left ventricular function as well as a TSH.  I will      gently diurese the patient with Lasix and we will follow his renal      function.  We will begin with Lasix 40 mg IV daily.  3. Hematochezia - we will ask gastroenterology to evaluate the patient      to see if he needs repeat colonoscopy.  Note he did have polyps      removed approximately 2 years ago by his report.  Also note his      hemoglobin is normal. 4. Hypertension - his blood pressure appears  to reasonably well-       controlled on his present medications.  5. Hyperlipidemia - we will check fasting lipids and liver and treat      as indicated.  6. History of mild coronary disease by previous catheterization -      planning medical therapy.  7. History of nonischemic cardiomyopathy - we will plan to repeat      echocardiogram.  8. Chronic substance abuse - we counseled the patient on      discontinuing.      Madolyn Frieze Jens Som, MD, [facility]  Electronically Signed     BSC/MEDQ  D:  12/17/2006  T:  12/17/2006  Job:  [id number]

## 2010-12-29 NOTE — Cardiovascular Report (Signed)
[place]  Patient:    [name] [name], [name] [name] Visit Number: [id number] MRN: [id number]          Service Type: CAT Location: [facility] 2899 17 Attending Physician:  Learta Codding Dictated by:   Rollene Rotunda, M.D. [facility] Proc. Date: 12/05/01 Admit Date:  12/05/2001 Discharge Date: 12/05/2001   CC:         Claretta Fraise, M.D. [facility]   Cardiac Catheterization  DATE OF BIRTH:  [birth year]  PRIMARY PHYSICIAN:  Claretta Fraise, M.D.  PROCEDURE:  Left heart catheterization, coronary angiography.  SURGEON:  Rollene Rotunda, M.D.  INDICATIONS:  Evaluate the patient with cardiomyopathy and increasing dyspnea.  PROCEDURAL NOTE:  Left heart catheterization was performed via the right femoral artery.  The artery was cannulated using anterior wall puncture.  A 6-French arterial sheath was inserted via the modified Seldinger technique. Preformed Judkins and a pigtail catheter were utilized.  The patient tolerated the procedure well and left the lab in stable condition.  RESULTS:  HEMODYNAMICS:  LV:  139/17.  AO:  149/92.  CORONARIES: 1. The left main was normal. 2. The LAD had a midline 25% stenosis, small first diagonal had ostial 50%    stenosis, a large second diagonal had luminal irregularities.  Of note,    the mid stenosis seemed to be more significant, and so the patient had    intracoronary nitroglycerin injected.  However, it was clearly not a    significant obstruction as the entire vessel and area dilated. 3. Circumflex:  There was a small AV groove vessel.  The circumflex    essentially was a very large ramus intermediate with luminal    irregularities. 4. Right coronary artery:  The right coronary artery was dominant and large.    There was a proximal 30% to 40% stenosis.  There was distal 25% stenosis.  LEFT VENTRICULOGRAM:  The left ventriculogram was obtained in the RAO projection.  The EF was approximately 40% to 45%.   However, this was very difficult to estimate because of ventricular ectopy.  CONCLUSIONS: 1. Nonobstructive coronary artery disease. 2. Mild-to-moderate left ventricular dysfunction.  PLAN:  The patient will continue to have medical management of his congestive heart failure. Dictated by:   Rollene Rotunda, M.D. [facility] Attending Physician:  Learta Codding DD:  12/05/01 TD:  12/05/01 Job: [id number]

## 2011-04-14 ENCOUNTER — Emergency Department (HOSPITAL_COMMUNITY): Payer: Self-pay

## 2011-04-14 ENCOUNTER — Emergency Department (HOSPITAL_COMMUNITY)
Admission: EM | Admit: 2011-04-14 | Discharge: 2011-04-15 | Disposition: A | Discharge status: Home / Self Care | Payer: Self-pay | Attending: Emergency Medicine | Admitting: Emergency Medicine

## 2011-04-14 DIAGNOSIS — Z7901 Long term (current) use of anticoagulants: Secondary | ICD-10-CM | POA: Insufficient documentation

## 2011-04-14 DIAGNOSIS — Y92009 Unspecified place in unspecified non-institutional (private) residence as the place of occurrence of the external cause: Secondary | ICD-10-CM | POA: Insufficient documentation

## 2011-04-14 DIAGNOSIS — E119 Type 2 diabetes mellitus without complications: Secondary | ICD-10-CM | POA: Insufficient documentation

## 2011-04-14 DIAGNOSIS — Z794 Long term (current) use of insulin: Secondary | ICD-10-CM | POA: Insufficient documentation

## 2011-04-14 DIAGNOSIS — W230XXA Caught, crushed, jammed, or pinched between moving objects, initial encounter: Secondary | ICD-10-CM | POA: Insufficient documentation

## 2011-04-14 DIAGNOSIS — S61209A Unspecified open wound of unspecified finger without damage to nail, initial encounter: Secondary | ICD-10-CM | POA: Insufficient documentation

## 2011-04-14 DIAGNOSIS — I10 Essential (primary) hypertension: Secondary | ICD-10-CM | POA: Insufficient documentation

## 2011-04-29 ENCOUNTER — Emergency Department (HOSPITAL_COMMUNITY)
Admission: EM | Admit: 2011-04-29 | Discharge: 2011-04-29 | Disposition: A | Discharge status: Home / Self Care | Payer: Self-pay | Attending: Emergency Medicine | Admitting: Emergency Medicine

## 2011-04-29 DIAGNOSIS — Z461 Encounter for fitting and adjustment of hearing aid: Secondary | ICD-10-CM | POA: Insufficient documentation

## 2011-05-05 ENCOUNTER — Other Ambulatory Visit: Payer: Self-pay | Admitting: Internal Medicine

## 2011-06-05 ENCOUNTER — Encounter: Payer: Self-pay | Admitting: *Deleted

## 2011-06-21 ENCOUNTER — Encounter: Payer: Self-pay | Admitting: Emergency Medicine

## 2011-06-21 ENCOUNTER — Emergency Department (HOSPITAL_COMMUNITY)
Admission: EM | Admit: 2011-06-21 | Discharge: 2011-06-21 | Disposition: A | Discharge status: Home Health | Payer: Self-pay | Attending: Emergency Medicine | Admitting: Emergency Medicine

## 2011-06-21 ENCOUNTER — Other Ambulatory Visit: Payer: Self-pay

## 2011-06-21 DIAGNOSIS — Z7901 Long term (current) use of anticoagulants: Secondary | ICD-10-CM | POA: Insufficient documentation

## 2011-06-21 DIAGNOSIS — R079 Chest pain, unspecified: Secondary | ICD-10-CM | POA: Insufficient documentation

## 2011-06-21 DIAGNOSIS — I4891 Unspecified atrial fibrillation: Secondary | ICD-10-CM | POA: Insufficient documentation

## 2011-06-21 DIAGNOSIS — E119 Type 2 diabetes mellitus without complications: Secondary | ICD-10-CM | POA: Insufficient documentation

## 2011-06-21 DIAGNOSIS — Z794 Long term (current) use of insulin: Secondary | ICD-10-CM | POA: Insufficient documentation

## 2011-06-21 DIAGNOSIS — I10 Essential (primary) hypertension: Secondary | ICD-10-CM | POA: Insufficient documentation

## 2011-06-21 HISTORY — DX: Essential (primary) hypertension: I10

## 2011-06-21 LAB — POCT I-STAT TROPONIN I: Troponin i, poc: 0.02 ng/mL (ref 0.00–0.08)

## 2011-06-21 MED ORDER — WARFARIN SODIUM 5 MG PO TABS: 2.5000 mg | ORAL_TABLET | Freq: Every day | ORAL | Status: DC

## 2011-06-21 MED ORDER — CANDESARTAN CILEXETIL 16 MG PO TABS: 16.0000 mg | ORAL_TABLET | Freq: Every day | ORAL | Status: DC

## 2011-06-21 MED ORDER — DIGOXIN 250 MCG PO TABS: 250.0000 ug | ORAL_TABLET | Freq: Every day | ORAL | Status: DC

## 2011-06-21 MED ORDER — FUROSEMIDE 40 MG PO TABS: 40.0000 mg | ORAL_TABLET | Freq: Two times a day (BID) | ORAL | Status: DC

## 2011-06-21 NOTE — ED Provider Notes (Addendum)
History     CSN: [id number] Arrival date & time: 06/21/2011  3:33 PM   First MD Initiated Contact with Patient 06/21/11 1606      Chief Complaint  Patient presents with  . Chest Pain    HPI Presented with brief history of chest discomfort.  Denies diaphoresis, dyspnea. No ACS hisotory.  Known hx. Of htn and DM.     Past Medical History  Diagnosis Date  . Heart beat abnormality     pt states heart arrythmia  . Diabetes mellitus   . Hypertension   . Gout   . A-fib     Past Surgical History  Procedure Date  . Osteochondroma excision 1972    Family History  Problem Relation Age of Onset  . Diabetes Mother   . Hypertension Mother     History  Substance Use Topics  . Smoking status: Former Games developer  . Smokeless tobacco: Not on file  . Alcohol Use: Yes     occassionally      Review of Systems  All other systems reviewed and are negative.    Allergies  Review of patient's allergies indicates no known allergies.  Home Medications   Current Outpatient Rx  Name Route Sig Dispense Refill  . CANDESARTAN CILEXETIL 16 MG PO TABS Oral Take 16 mg by mouth daily.      [name] CARVEDILOL 25 MG PO TABS  TAKE ONE TABLET BY MOUTH TWICE DAILY 60 tablet 4  . DIGOXIN 0.25 MG PO TABS  TAKE ONE TABLET BY MOUTH EVERY DAY 30 tablet 4  . FUROSEMIDE 40 MG PO TABS  TAKE ONE & ONE-HALF TABLETS BY MOUTH TWICE DAILY 90 tablet 4  . INSULIN GLARGINE 100 UNIT/ML [place] SOLN Subcutaneous Inject 40 Units into the skin at bedtime.      [name] METFORMIN HCL 500 MG PO TABS Oral Take 500 mg by mouth 2 (two) times daily with a meal.      . POTASSIUM CHLORIDE CRYS CR 20 MEQ PO TBCR Oral Take 40 mEq by mouth daily.      . WARFARIN SODIUM 2.5 MG PO TABS Oral Take 2.5 mg by mouth every other day. Alternating daily with 5mg      . WARFARIN SODIUM 5 MG PO TABS Oral Take 5 mg by mouth every other day. Alternating daily with 2.5mg       BP 161/92  Pulse 68  Temp(Src) 98.3 F (36.8 C) (Oral)  Resp 17  SpO2  100%  Physical Exam  Nursing note and vitals reviewed. Constitutional: He is oriented to person, place, and time. He appears well-developed and well-nourished. No distress.  HENT:  Head: Normocephalic and atraumatic.  Eyes: Pupils are equal, round, and reactive to light.  Neck: Normal range of motion.  Cardiovascular: Normal rate and intact distal pulses.   Pulmonary/Chest: No respiratory distress.  Abdominal: Normal appearance. He exhibits no distension.  Musculoskeletal: Normal range of motion.  Neurological: He is alert and oriented to person, place, and time. No cranial nerve deficit.  Skin: Skin is warm and dry. No rash noted.  Psychiatric: He has a normal mood and affect. His behavior is normal.    ED Course  Procedures (including critical care time)  Results for orders placed during the hospital encounter of 06/21/11  POCT I-STAT TROPONIN I      Component Value Range   Troponin i, poc 0.02  0.00 - 0.08 (ng/mL)   Comment 3  No results found.     MDM    Date: 06/21/2011  Rate: 69  Rhythm: normal sinus rhythm  QRS Axis: normal  Intervals: normal  ST/T Wave abnormalities: early repolarization  Conduction Disutrbances:none  Narrative Interpretation:   Old EKG Reviewed: unchanged  Plan is to refill his medications and have him followup with his primary care doctor.   Diagnosis: Hypertension  Feeling better wants to go home.          Nelia Shi, MD 06/21/11 1800  Nelia Shi, MD 06/21/11 1815  Nelia Shi, MD 09/02/11 [phone]

## 2011-08-28 ENCOUNTER — Ambulatory Visit: Payer: Self-pay | Admitting: *Deleted

## 2011-08-28 ENCOUNTER — Telehealth: Payer: Self-pay | Admitting: *Deleted

## 2011-08-28 DIAGNOSIS — I4892 Unspecified atrial flutter: Secondary | ICD-10-CM

## 2011-08-28 NOTE — Telephone Encounter (Signed)
Spoke with patient and his coumadin is being [facility] [place] on [address].

## 2011-10-31 ENCOUNTER — Telehealth: Payer: Self-pay | Admitting: *Deleted

## 2011-10-31 ENCOUNTER — Encounter: Payer: Self-pay | Admitting: *Deleted

## 2011-10-31 NOTE — Telephone Encounter (Signed)
Done

## 2011-12-18 ENCOUNTER — Inpatient Hospital Stay (HOSPITAL_COMMUNITY)
Admission: EM | Admit: 2011-12-18 | Discharge: 2011-12-20 | DRG: 287 | Disposition: A | Discharge status: Home / Self Care | Payer: Medicaid Other | Attending: Cardiology | Admitting: Cardiology

## 2011-12-18 ENCOUNTER — Encounter (HOSPITAL_COMMUNITY)
Admission: EM | Disposition: A | Discharge status: Home / Self Care | Payer: Self-pay | Source: Home / Self Care | Attending: Cardiology

## 2011-12-18 ENCOUNTER — Encounter (HOSPITAL_COMMUNITY): Payer: Self-pay | Admitting: Internal Medicine

## 2011-12-18 DIAGNOSIS — Z91199 Patient's noncompliance with other medical treatment and regimen due to unspecified reason: Secondary | ICD-10-CM

## 2011-12-18 DIAGNOSIS — IMO0001 Reserved for inherently not codable concepts without codable children: Secondary | ICD-10-CM | POA: Diagnosis present

## 2011-12-18 DIAGNOSIS — R079 Chest pain, unspecified: Secondary | ICD-10-CM

## 2011-12-18 DIAGNOSIS — D1600 Benign neoplasm of scapula and long bones of unspecified upper limb: Secondary | ICD-10-CM | POA: Diagnosis present

## 2011-12-18 DIAGNOSIS — Z7982 Long term (current) use of aspirin: Secondary | ICD-10-CM

## 2011-12-18 DIAGNOSIS — E785 Hyperlipidemia, unspecified: Secondary | ICD-10-CM | POA: Diagnosis present

## 2011-12-18 DIAGNOSIS — Z7901 Long term (current) use of anticoagulants: Secondary | ICD-10-CM

## 2011-12-18 DIAGNOSIS — I509 Heart failure, unspecified: Secondary | ICD-10-CM | POA: Diagnosis present

## 2011-12-18 DIAGNOSIS — R072 Precordial pain: Secondary | ICD-10-CM

## 2011-12-18 DIAGNOSIS — I428 Other cardiomyopathies: Secondary | ICD-10-CM | POA: Diagnosis present

## 2011-12-18 DIAGNOSIS — K625 Hemorrhage of anus and rectum: Secondary | ICD-10-CM

## 2011-12-18 DIAGNOSIS — I2510 Atherosclerotic heart disease of native coronary artery without angina pectoris: Principal | ICD-10-CM | POA: Diagnosis present

## 2011-12-18 DIAGNOSIS — E7800 Pure hypercholesterolemia, unspecified: Secondary | ICD-10-CM | POA: Diagnosis present

## 2011-12-18 DIAGNOSIS — I10 Essential (primary) hypertension: Secondary | ICD-10-CM

## 2011-12-18 DIAGNOSIS — B181 Chronic viral hepatitis B without delta-agent: Secondary | ICD-10-CM | POA: Diagnosis present

## 2011-12-18 DIAGNOSIS — I5022 Chronic systolic (congestive) heart failure: Secondary | ICD-10-CM | POA: Diagnosis present

## 2011-12-18 DIAGNOSIS — M109 Gout, unspecified: Secondary | ICD-10-CM | POA: Diagnosis present

## 2011-12-18 DIAGNOSIS — Z860100 Personal history of colon polyps, unspecified: Secondary | ICD-10-CM

## 2011-12-18 DIAGNOSIS — Z9119 Patient's noncompliance with other medical treatment and regimen: Secondary | ICD-10-CM

## 2011-12-18 DIAGNOSIS — I059 Rheumatic mitral valve disease, unspecified: Secondary | ICD-10-CM | POA: Diagnosis present

## 2011-12-18 DIAGNOSIS — Z888 Allergy status to other drugs, medicaments and biological substances status: Secondary | ICD-10-CM

## 2011-12-18 DIAGNOSIS — I4891 Unspecified atrial fibrillation: Secondary | ICD-10-CM | POA: Diagnosis present

## 2011-12-18 DIAGNOSIS — I209 Angina pectoris, unspecified: Secondary | ICD-10-CM | POA: Diagnosis present

## 2011-12-18 DIAGNOSIS — Z79899 Other long term (current) drug therapy: Secondary | ICD-10-CM

## 2011-12-18 HISTORY — DX: Cardiac murmur, unspecified: R01.1

## 2011-12-18 HISTORY — DX: Personal history of other diseases of the circulatory system: Z86.79

## 2011-12-18 HISTORY — DX: Chronic viral hepatitis B without delta-agent: B18.1

## 2011-12-18 HISTORY — DX: Hemorrhage of anus and rectum: K62.5

## 2011-12-18 HISTORY — DX: Personal history of diseases of the blood and blood-forming organs and certain disorders involving the immune mechanism: Z86.2

## 2011-12-18 HISTORY — DX: Personal history of other endocrine, nutritional and metabolic disease: Z86.39

## 2011-12-18 HISTORY — DX: Personal history of colonic polyps: Z86.010

## 2011-12-18 HISTORY — DX: Rheumatic mitral valve disease, unspecified: I05.9

## 2011-12-18 HISTORY — DX: Atherosclerotic heart disease of native coronary artery without angina pectoris: I25.10

## 2011-12-18 HISTORY — DX: Pure hypercholesterolemia, unspecified: E78.00

## 2011-12-18 LAB — CBC
MCV: 80.6 fL (ref 78.0–100.0)
Platelets: 177 10*3/uL (ref 150–400)
RBC: 5.37 MIL/uL (ref 4.22–5.81)
WBC: 6.6 10*3/uL (ref 4.0–10.5)

## 2011-12-18 LAB — CARDIAC PANEL(CRET KIN+CKTOT+MB+TROPI)
Relative Index: 1.6 (ref 0.0–2.5)
Total CK: 131 U/L (ref 7–232)
Troponin I: <0.3 ng/mL (ref <0.30)

## 2011-12-18 LAB — DIGOXIN LEVEL: Digoxin Level: 0.4 ng/mL — ABNORMAL LOW (ref 0.8–2.0)

## 2011-12-18 LAB — APTT: aPTT: 43 seconds — ABNORMAL HIGH (ref 24–37)

## 2011-12-18 LAB — PROTIME-INR
INR: 1.54 — ABNORMAL HIGH (ref 0.00–1.49)
Prothrombin Time: 18.8 seconds — ABNORMAL HIGH (ref 11.6–15.2)

## 2011-12-18 LAB — RAPID URINE DRUG SCREEN, HOSP PERFORMED
Amphetamines: NOT DETECTED
Benzodiazepines: NOT DETECTED
Cocaine: NOT DETECTED
Opiates: NOT DETECTED

## 2011-12-18 LAB — LIPID PANEL
LDL Cholesterol: 85 mg/dL (ref 0–99)
Total CHOL/HDL Ratio: 2.9 RATIO
VLDL: 25 mg/dL (ref 0–40)

## 2011-12-18 LAB — COMPREHENSIVE METABOLIC PANEL
Albumin: 3.8 g/dL (ref 3.5–5.2)
Alkaline Phosphatase: 69 U/L (ref 39–117)
BUN: 14 mg/dL (ref 6–23)
Potassium: 3.7 mEq/L (ref 3.5–5.1)
Sodium: 133 mEq/L — ABNORMAL LOW (ref 135–145)
Total Protein: 8.1 g/dL (ref 6.0–8.3)

## 2011-12-18 LAB — GLUCOSE, CAPILLARY

## 2011-12-18 LAB — MAGNESIUM: Magnesium: 2 mg/dL (ref 1.5–2.5)

## 2011-12-18 SURGERY — LEFT HEART CATHETERIZATION WITH CORONARY ANGIOGRAM
Anesthesia: LOCAL

## 2011-12-18 MED ORDER — MORPHINE SULFATE 2 MG/ML IJ SOLN: 1.0000 mg | INTRAMUSCULAR | Status: DC | PRN

## 2011-12-18 MED ORDER — POTASSIUM CHLORIDE CRYS ER 20 MEQ PO TBCR
20.0000 meq | EXTENDED_RELEASE_TABLET | Freq: Every day | ORAL | Status: DC
  Administered 2011-12-18 – 2011-12-20 (×3): 20 meq via ORAL
  Filled 2011-12-18 (×2): qty 1

## 2011-12-18 MED ORDER — WARFARIN SODIUM 7.5 MG PO TABS
7.5000 mg | ORAL_TABLET | Freq: Once | ORAL | Status: AC
  Administered 2011-12-18: 7.5 mg via ORAL
  Filled 2011-12-18: qty 1

## 2011-12-18 MED ORDER — FUROSEMIDE 80 MG PO TABS
80.0000 mg | ORAL_TABLET | Freq: Every day | ORAL | Status: DC
  Administered 2011-12-18 – 2011-12-19 (×2): 80 mg via ORAL
  Filled 2011-12-18 (×2): qty 1

## 2011-12-18 MED ORDER — HEPARIN SODIUM (PORCINE) 5000 UNIT/ML IJ SOLN
5000.0000 [IU] | Freq: Three times a day (TID) | INTRAMUSCULAR | Status: DC
  Administered 2011-12-18 – 2011-12-19 (×3): 5000 [IU] via SUBCUTANEOUS
  Filled 2011-12-18 (×6): qty 1

## 2011-12-18 MED ORDER — CARVEDILOL 25 MG PO TABS
25.0000 mg | ORAL_TABLET | Freq: Two times a day (BID) | ORAL | Status: DC
Start: 2011-12-18 — End: 2011-12-20
  Administered 2011-12-18 – 2011-12-20 (×4): 25 mg via ORAL
  Filled 2011-12-18 (×7): qty 1

## 2011-12-18 MED ORDER — ATORVASTATIN CALCIUM 40 MG PO TABS
40.0000 mg | ORAL_TABLET | Freq: Every day | ORAL | Status: DC
  Administered 2011-12-18 – 2011-12-19 (×2): 40 mg via ORAL
  Filled 2011-12-18 (×3): qty 1

## 2011-12-18 MED ORDER — ASPIRIN EC 81 MG PO TBEC
81.0000 mg | DELAYED_RELEASE_TABLET | Freq: Every day | ORAL | Status: DC
  Administered 2011-12-20: 81 mg via ORAL
  Filled 2011-12-18 (×2): qty 1

## 2011-12-18 MED ORDER — LOSARTAN POTASSIUM 50 MG PO TABS
100.0000 mg | ORAL_TABLET | Freq: Every day | ORAL | Status: DC
  Administered 2011-12-18 – 2011-12-20 (×3): 100 mg via ORAL
  Filled 2011-12-18 (×3): qty 2

## 2011-12-18 MED ORDER — NITROGLYCERIN 0.4 MG SL SUBL: 0.4000 mg | SUBLINGUAL_TABLET | SUBLINGUAL | Status: DC | PRN

## 2011-12-18 MED ORDER — ACETAMINOPHEN 325 MG PO TABS
650.0000 mg | ORAL_TABLET | ORAL | Status: DC | PRN
  Filled 2011-12-18: qty 2

## 2011-12-18 MED ORDER — INSULIN ASPART 100 UNIT/ML ~~LOC~~ SOLN
0.0000 [IU] | Freq: Three times a day (TID) | SUBCUTANEOUS | Status: DC
  Administered 2011-12-18: 9 [IU] via SUBCUTANEOUS
  Administered 2011-12-19: 5 [IU] via SUBCUTANEOUS
  Administered 2011-12-20: 09:00:00 via SUBCUTANEOUS

## 2011-12-18 MED ORDER — ONDANSETRON HCL 4 MG/2ML IJ SOLN: 4.0000 mg | Freq: Four times a day (QID) | INTRAMUSCULAR | Status: DC | PRN

## 2011-12-18 MED ORDER — WARFARIN - PHARMACIST DOSING INPATIENT: Freq: Every day | Status: DC

## 2011-12-18 MED ORDER — SODIUM CHLORIDE 0.9 % IJ SOLN
3.0000 mL | Freq: Two times a day (BID) | INTRAMUSCULAR | Status: DC
  Administered 2011-12-18 – 2011-12-20 (×3): 3 mL via INTRAVENOUS

## 2011-12-18 MED ORDER — INSULIN GLARGINE 100 UNIT/ML ~~LOC~~ SOLN
40.0000 [IU] | Freq: Every day | SUBCUTANEOUS | Status: DC
  Administered 2011-12-18 – 2011-12-19 (×2): 40 [IU] via SUBCUTANEOUS

## 2011-12-18 MED ORDER — INSULIN ASPART 100 UNIT/ML ~~LOC~~ SOLN
0.0000 [IU] | Freq: Every day | SUBCUTANEOUS | Status: DC
  Administered 2011-12-18: 5 [IU] via SUBCUTANEOUS
  Administered 2011-12-19: 3 [IU] via SUBCUTANEOUS

## 2011-12-18 MED ORDER — DIGOXIN 250 MCG PO TABS
250.0000 ug | ORAL_TABLET | Freq: Every day | ORAL | Status: DC
  Administered 2011-12-18: 250 ug via ORAL
  Filled 2011-12-18 (×2): qty 1

## 2011-12-18 MED ORDER — ALLOPURINOL 300 MG PO TABS
300.0000 mg | ORAL_TABLET | Freq: Every day | ORAL | Status: DC
  Administered 2011-12-18 – 2011-12-20 (×3): 300 mg via ORAL
  Filled 2011-12-18 (×3): qty 1

## 2011-12-18 MED ORDER — INSULIN ASPART 100 UNIT/ML ~~LOC~~ SOLN
10.0000 [IU] | Freq: Once | SUBCUTANEOUS | Status: AC
  Administered 2011-12-18: 10 [IU] via SUBCUTANEOUS

## 2011-12-18 MED ORDER — POTASSIUM CHLORIDE CRYS ER 20 MEQ PO TBCR
EXTENDED_RELEASE_TABLET | ORAL | Status: AC
  Administered 2011-12-18: 20 meq via ORAL
  Filled 2011-12-18: qty 1

## 2011-12-18 MED ORDER — REGADENOSON 0.4 MG/5ML IV SOLN
0.4000 mg | Freq: Once | INTRAVENOUS | Status: DC
  Filled 2011-12-18: qty 5

## 2011-12-18 NOTE — Progress Notes (Signed)
  Echocardiogram 2D Echocardiogram has been performed.  Cathie Beams Deneen 12/18/2011, 4:03 PM

## 2011-12-18 NOTE — Progress Notes (Signed)
pts CBG 491; Dr.Illath paged and made aware; new orders received; told to give an additional 10units of novolog along with SSI this time; will continue to monitor

## 2011-12-18 NOTE — ED Notes (Signed)
attempted to call report x1. Secretary given this RN's number to return call.

## 2011-12-18 NOTE — ED Notes (Signed)
Pt here per [facility] after being seen at PCP.  Pt reported having some CP that comes and goes to his PCP.  PCP gave NTG SL x1, ASA 324mg  and called [facility] for transport.  [facility] placed IV, O2 and transported pt.  Pt denies CP or having any since Sat this past weekend.

## 2011-12-18 NOTE — Progress Notes (Signed)
ANTICOAGULATION CONSULT NOTE - Initial Consult  Pharmacy Consult for coumadin Indication: atrial fibrillation  No Known Allergies  Patient Measurements:   Weight:  95.3 kg  Vital Signs: Temp: 97.9 F (36.6 C) (05/07 1043) Temp src: Oral (05/07 1043) BP: 121/81 mmHg (05/07 1334) Pulse Rate: 68  (05/07 1334)  Labs:  Basename 12/18/11 1120  HGB 15.3  HCT 43.3  PLT 177  APTT 43*  LABPROT 18.8*  INR 1.54*  HEPARINUNFRC --  CREATININE 0.98  CKTOTAL 131  CKMB 2.1  TROPONINI <0.30    The CrCl is unknown because both a height and weight (above a minimum accepted value) are required for this calculation.   Medical History: Past Medical History  Diagnosis Date  . Heart beat abnormality     pt states heart arrythmia  . Hypertension   . Gout   . A-fib   . DIABETES MELLITUS, TYPE II 03/06/2009    Qualifier: Diagnosis of  By: Daphine Deutscher FNP, Zena Amos    . GOUT 05/05/2009    Qualifier: Diagnosis of  By: Daphine Deutscher FNP, Zena Amos    . HYPERTENSION, BENIGN ESSENTIAL 04/07/2009    Qualifier: Diagnosis of  By: Daphine Deutscher FNP, Zena Amos    . LIVER FUNCTION TESTS, ABNORMAL, HX OF 12/30/2006    Qualifier: Diagnosis of  By: Drue Novel MD, Nolon Rod.   . History of cardiac arrhythmia 12/18/2011    S/p cardioversion ?    . Chronic systolic CHF (congestive heart failure) 12/18/2011    2 D echo (2008)Overall left ventricular systolic function was moderately to markedly decreased.Left ventricular ejection fraction was estimated , range being 25% to 35 %.There was severe diffuse left ventricular hypokinesis.     . Rectal bleeding 12/18/2011    Scheduled for colonoscopy.    [name] HEPATITIS B, CHRONIC 12/30/2006    Qualifier: Diagnosis of  By: Drue Novel MD, Nolon Rod.   . OSTEOCHONDROMA 12/30/2006    Annotation: LEFT SHOULDER Qualifier: Diagnosis of  By: Drue Novel MD, Nolon Rod.   . HYPERCHOLESTEROLEMIA 07/11/2010    Qualifier: Diagnosis of  By: Shannon, [place]    . MITRAL REGURGITATION 12/30/2006    Qualifier: Diagnosis of  By: Drue Novel MD, Nolon Rod COLONIC POLYPS, HX OF 12/30/2006    Qualifier: Diagnosis of  By: Drue Novel MD, Nolon Rod.   . Heart murmur   . Shortness of breath     Medications:  Prescriptions prior to admission  Medication Sig Dispense Refill  . allopurinol (ZYLOPRIM) 300 MG tablet Take 300 mg by mouth daily.      . carvedilol (COREG) 25 MG tablet Take 25 mg by mouth 2 (two) times daily with a meal.      . colchicine (COLCRYS) 0.6 MG tablet Take 0.6 mg by mouth daily as needed. For gout flare up      . digoxin (LANOXIN) 0.25 MG tablet Take 250 mcg by mouth daily.      . furosemide (LASIX) 40 MG tablet Take 80 mg by mouth at bedtime.      . insulin glargine (LANTUS) 100 UNIT/ML injection Inject 40 Units into the skin at bedtime.        [name] losartan (COZAAR) 100 MG tablet Take 100 mg by mouth daily.      . metFORMIN (GLUCOPHAGE) 500 MG tablet Take 500 mg by mouth 2 (two) times daily with a meal.        . potassium chloride SA (K-DUR,KLOR-CON) 20 MEQ tablet Take 20 mEq by mouth daily at 12  noon.       . rosuvastatin (CRESTOR) 20 MG tablet Take 20 mg by mouth at bedtime.      [name] warfarin (COUMADIN) 5 MG tablet Take 2.5-5 mg by mouth daily. Alternating tablets,  Took 5mg  on 12/17/11        Assessment: 56 yo man with h/o afib to continue coumadin.  INR today 1.54.  Home dose 5 mg alternate 2.5 mg.  ? Compliance with regimen Goal of Therapy:  INR 2-3 Monitor platelets by anticoagulation protocol: Yes   Plan:  Coumadin 7.5 mg today. Check daily PT/INR. F/u CBC. D/c sq heparin when INR > 2.0.  Talbert Cage Poteet 12/18/2011,3:08 PM

## 2011-12-18 NOTE — Progress Notes (Signed)
Pt CBG 579, pt asymptomatic. MD paged x2 with no response. Gave pt ordered dose 40 lantus and HS novolog coverage 5 units. Rechecked CBG 381. Will continue to monitor.

## 2011-12-18 NOTE — H&P (Signed)
[facility] Resident Note  Patient ID: [name] [name] MRN: [id number] DOB/AGE: March 14, 1956 56 y.o. Admit date: 12/18/2011  Primary Care Physician:MARTIN,NYKEDTRA, NP, NP ( [facility]) Primary Cardiologist: Macarthur Critchley  Principal Problem:  *Chest pain Active Problems:  DIABETES MELLITUS, TYPE II  GOUT  HYPERTENSION, BENIGN ESSENTIAL  LIVER FUNCTION TESTS, ABNORMAL, HX OF  History of cardiac arrhythmia  Chronic systolic CHF (congestive heart failure)  HPI: This is a 56 year old male with past medical history significant for diabetes, hypertension, chronic systolic CHF with an EF of 35% per 2-D echo in 2008 who presented to his primary care with chest pain. Patient reports that he has been experiencing chest pressure since 3 weeks with exertion. He noted that he would walk in the grocery store and would experience chest pressure none of 10 in severity radiating to his left arm. He had 4 episodes since then. He endorses occasional dizziness and cramping in his feet but otherwise denies headache, shortness of breath, nausea or abdominal pain. He's not sure if he has experienced similar symptoms in the past but nothing recently. He noted that he ran out of his blood pressure medication (losartan) and went to refill his medication today at his primary care's office. They transferred him to the ED with a history of chest pain. When obtaining an EKG by EMS there is a concern for STEMI. Reviewing the EKG in the ED by Dr. Shirlee Latch noted LVH possible abnormal repolarization which is unchanged from prior EKG.  Past Medical History  Diagnosis Date  .        [name] Hypertension   . Gout   . A-fib   . DIABETES MELLITUS, TYPE II 03/06/2009    Qualifier: Diagnosis of  By: Daphine Deutscher FNP, Zena Amos    . GOUT 05/05/2009    Qualifier: Diagnosis of  By: Daphine Deutscher FNP, Zena Amos    . HYPERTENSION, BENIGN ESSENTIAL 04/07/2009    Qualifier: Diagnosis of  By: Daphine Deutscher FNP, Zena Amos    . LIVER  FUNCTION TESTS, ABNORMAL, HX OF 12/30/2006    Qualifier: Diagnosis of  By: Drue Novel MD, Jose E.   . History of Afib 12/18/2011    S/p cardioversion  . On Warfarin. Has been on Amiodarone but d/c due to lung problem ?   [name] Chronic systolic CHF (congestive heart failure) 12/18/2011    2 D echo (2008)Overall left ventricular systolic function was moderately to markedly decreased.Left ventricular ejection fraction was estimated , range being 25% to 35 %.There was severe diffuse left ventricular hypokinesis.     . Rectal bleeding 12/18/2011    Scheduled for colonoscopy.    [name] HEPATITIS B, CHRONIC 12/30/2006    Qualifier: Diagnosis of  By: Drue Novel MD, Nolon Rod.   . OSTEOCHONDROMA 12/30/2006    Annotation: LEFT SHOULDER Qualifier: Diagnosis of  By: Drue Novel MD, Nolon Rod.   . HYPERCHOLESTEROLEMIA 07/11/2010    Qualifier: Diagnosis of  By: Shannon, [place]    . MITRAL REGURGITATION 12/30/2006    Qualifier: Diagnosis of  By: Drue Novel MD, Nolon Rod COLONIC POLYPS, HX OF 12/30/2006    Qualifier: Diagnosis of  By: Drue Novel MD, Nolon Rod.     Past Surgical History  Procedure Date  . Osteochondroma excision 1972    Medication:   1. Allopurinol 300 mg daily 2. Coreg 25 mg twice a day 3.digoxin 0.25 mg daily 4.Lantus 40 units daily 5.Cozaar 100 mg daily 6.metformin 500 mg twice a day 7.colchicine 0.6 mg when necessary for  gout flare 8.Lasix 80 mg daily 9.potassium chloride 20 mEq daily 10.Crestor 1020 mg daily 11.warfarin   Family History  Problem Relation Age of Onset  . Diabetes Mother   . Hypertension Mother     History   Social History  . Marital Status: Married    Spouse Name: N/A    Number of Children: N/A  . Years of Education: N/A   Occupational History  . Not on file.   Social History Main Topics  . Smoking status: Former Games developer  . Smokeless tobacco: Not on file  . Alcohol Use: Yes     occassionally  . Drug Use: Yes    Special: Marijuana  . Sexually Active:    Other Topics Concern  . Not on file    Social History Narrative  . No narrative on file     ROS: Bold if positive:  General:  fevers/chills/night sweats Eyes:  blurry vision, diplopia,   ENT:  sore throat , hearing loss Resp: cough, wheezing,  hemoptysis CV: edema , palpitations GI:  abdominal pain, nausea, vomiting, diarrhea,  Constipation, blood in the stool last week GU: dysuria, frequency,  hematuria Skin:  rash Neuro: headache, numbness, tingling,  weakness of extremities Heme:  bleeding, , or easy bruising Endo:  polydipsia , polyuria   Physical Exam: Blood pressure 172/94, pulse 73, temperature 97.9 F (36.6 C), temperature source Oral, resp. rate 18, SpO2 100.00%. Current Weight  09/27/10 210 lb (95.255 kg)  08/24/10 213 lb (96.616 kg)  06/30/10 219 lb 11.2 oz (99.655 kg)  03/30/10 209 lb 8 oz (95.029 kg)    General: Vital signs reviewed and noted. Well-developed, well-nourished, in no acute distress; alert, appropriate and cooperative throughout examination.  HEENT: normal Neck: JVP normal. Carotid upstrokes normal without bruits. No thyromegaly. Lungs: Normal respiratory effort. Clear to auscultation BL without crackles or wheezes. CV: Apex is discrete and nondisplaced, RRR without murmur or gallop Abd: soft, NT, +BS, no bruit,  Back: no CVA tenderness Ext: no clubbing, cyanosis, edema         Femoral pulses 2+equal without bruits        DP/PT pulses intact and equal Neuro: grossly within normal limits.              Strength intact equal bilaterally   Labs: pending  Urine Drug Screen: Drugs of Abuse     Component Value Date/Time   LABOPIA NONE DETECTED 03/06/2009 0149   COCAINSCRNUR NONE DETECTED 03/06/2009 0149   LABBENZ NONE DETECTED 03/06/2009 0149   AMPHETMU NONE DETECTED 03/06/2009 0149   THCU NONE DETECTED 03/06/2009 0149   LABBARB  Value: NONE DETECTED        DRUG SCREEN FOR MEDICAL PURPOSES ONLY.  IF CONFIRMATION IS NEEDED FOR ANY PURPOSE, NOTIFY LAB WITHIN 5 DAYS.        LOWEST  DETECTABLE LIMITS FOR URINE DRUG SCREEN Drug Class       Cutoff (ng/mL) Amphetamine      1000 Barbiturate      200 Benzodiazepine   200 Tricyclics       300 Opiates          300 Cocaine          300 THC              50 03/06/2009 0149     Radiology: No results found.  WUJ:WJXBJ rhythm. LVH with secondary repolarization abnormalities  Last Echo:(2008) Overall left ventricular systolic function was moderately to markedly decreased. Left  ventricular ejection fraction was estimated , range being 25% to 35 %. There was severe diffuse left ventricular hypokinesis.   Last Cath: 2010 Heart catheterization shows moderate global left ventricular systolic dysfunction, nonobstructive coronary disease and elevated right and left heart filling pressures.    ASSESSMENT AND PLAN:  This is a 56 year old male with past medical history significant for diabetes, hypertension, chronic systolic CHF with an EF of 35% per 2-D echo in 2008  who was admitted on 12/18/2011 for chest pain  1. Chest pain concerning for ACS with significant history of hypertension, diabetes, CHF with an EF of 30% in 2008. - Will admit patient to telemetry - Cycle cardiac enzymes - Nitroglycerin sublingual when necessary - Morphine for pain control - Consider 2-D echo to evaluate LV function - May need stress test as an outpatient  2. Hypertension uncontrolled with blood pressures 170s / 100 likely due to noncompliance. Patient denies current headache, dizziness, chest pain or shortness of breath due for another urgent reduction of his blood pressure is needed. - We will restart his home medication including losartan 100 mg, Corag 25 mg and digoxin 0.25 mg   3. Chronic systolic CHF with EF of 30 % . Currently does not seem fluid overloaded .   4. Diabetes Type 2: Currently on metformin and Lantus - Will obtain hemoglobin A1c - Will restart  home dose Lantus and hold off on metformin for possible further evaluation and management of  chest pain  5. Hyperlipidemia, last LDL 111 (06/2010) - Will obtain lipid panel - Continue Crestor 20 mg and possible changes in management according to lipid panel results  6. Gout - Will continue allopurinol  DVT PPX: Heparin  Patient history and plan of care reviewed with attending, Dr. Eden Emms  Signed: Almyra Deforest 12/18/2011, 11:20 AM  Patient examined chart reviewed.  Patient is poor historian and not very compliant with coumadin as evidenced by low INR.  Followed for PAF by Dr Ladona Ridgel.  INR followed at [facility].  Cath in 2010 by Dr Shirlee Latch with no significant disease presumed nonishcemic DCM.  Agree that ECG is LVH with strain and no acute changes.  R/O Echo to reassess EF and Lexiscan myouve in am.  Charlton Haws 12:17 PM 12/18/2011

## 2011-12-19 ENCOUNTER — Encounter (HOSPITAL_COMMUNITY)
Admission: EM | Disposition: A | Discharge status: Home / Self Care | Payer: Self-pay | Source: Home / Self Care | Attending: Cardiology

## 2011-12-19 DIAGNOSIS — R079 Chest pain, unspecified: Secondary | ICD-10-CM

## 2011-12-19 DIAGNOSIS — I2510 Atherosclerotic heart disease of native coronary artery without angina pectoris: Secondary | ICD-10-CM

## 2011-12-19 HISTORY — PX: LEFT HEART CATHETERIZATION WITH CORONARY ANGIOGRAM: SHX5451

## 2011-12-19 LAB — PROTIME-INR
INR: 1.82 — ABNORMAL HIGH (ref 0.00–1.49)
Prothrombin Time: 21.4 seconds — ABNORMAL HIGH (ref 11.6–15.2)

## 2011-12-19 LAB — BASIC METABOLIC PANEL
CO2: 27 mEq/L (ref 19–32)
Chloride: 99 mEq/L (ref 96–112)
Glucose, Bld: 266 mg/dL — ABNORMAL HIGH (ref 70–99)
Potassium: 3.7 mEq/L (ref 3.5–5.1)
Sodium: 134 mEq/L — ABNORMAL LOW (ref 135–145)

## 2011-12-19 LAB — CARDIAC PANEL(CRET KIN+CKTOT+MB+TROPI)
CK, MB: 2.5 ng/mL (ref 0.3–4.0)
Relative Index: 2.5 (ref 0.0–2.5)
Troponin I: <0.3 ng/mL (ref <0.30)

## 2011-12-19 LAB — HEMOGLOBIN A1C: Mean Plasma Glucose: 372 mg/dL — ABNORMAL HIGH (ref <117)

## 2011-12-19 LAB — GLUCOSE, CAPILLARY: Glucose-Capillary: 145 mg/dL — ABNORMAL HIGH (ref 70–99)

## 2011-12-19 SURGERY — LEFT HEART CATHETERIZATION WITH CORONARY ANGIOGRAM
Anesthesia: LOCAL

## 2011-12-19 MED ORDER — HEPARIN SODIUM (PORCINE) 1000 UNIT/ML IJ SOLN
INTRAMUSCULAR | Status: AC
  Filled 2011-12-19: qty 1

## 2011-12-19 MED ORDER — HEPARIN (PORCINE) IN NACL 2-0.9 UNIT/ML-% IJ SOLN
INTRAMUSCULAR | Status: AC
  Filled 2011-12-19: qty 2000

## 2011-12-19 MED ORDER — FUROSEMIDE 40 MG PO TABS
40.0000 mg | ORAL_TABLET | Freq: Every day | ORAL | Status: DC
  Administered 2011-12-20: 40 mg via ORAL
  Filled 2011-12-19: qty 1

## 2011-12-19 MED ORDER — AMLODIPINE BESYLATE 5 MG PO TABS
5.0000 mg | ORAL_TABLET | Freq: Every day | ORAL | Status: DC
  Administered 2011-12-19 – 2011-12-20 (×2): 5 mg via ORAL
  Filled 2011-12-19 (×2): qty 1

## 2011-12-19 MED ORDER — NITROGLYCERIN 0.2 MG/ML ON CALL CATH LAB
INTRAVENOUS | Status: AC
  Filled 2011-12-19: qty 1

## 2011-12-19 MED ORDER — SODIUM CHLORIDE 0.9 % IV SOLN: INTRAVENOUS | Status: AC

## 2011-12-19 MED ORDER — ACETAMINOPHEN 325 MG PO TABS
650.0000 mg | ORAL_TABLET | ORAL | Status: DC | PRN
  Administered 2011-12-20: 650 mg via ORAL

## 2011-12-19 MED ORDER — SODIUM CHLORIDE 0.9 % IV SOLN
1.0000 mL/kg/h | INTRAVENOUS | Status: DC
  Administered 2011-12-19: 1 mL/kg/h via INTRAVENOUS

## 2011-12-19 MED ORDER — SODIUM CHLORIDE 0.9 % IV SOLN: 250.0000 mL | INTRAVENOUS | Status: DC | PRN

## 2011-12-19 MED ORDER — WARFARIN SODIUM 7.5 MG PO TABS
7.5000 mg | ORAL_TABLET | Freq: Once | ORAL | Status: AC
  Administered 2011-12-19: 7.5 mg via ORAL
  Filled 2011-12-19: qty 1

## 2011-12-19 MED ORDER — LIVING WELL WITH DIABETES BOOK
Freq: Once | Status: AC
  Administered 2011-12-19: 19:00:00
  Filled 2011-12-19 (×2): qty 1

## 2011-12-19 MED ORDER — FENTANYL CITRATE 0.05 MG/ML IJ SOLN
INTRAMUSCULAR | Status: AC
  Filled 2011-12-19: qty 2

## 2011-12-19 MED ORDER — ONDANSETRON HCL 4 MG/2ML IJ SOLN: 4.0000 mg | Freq: Four times a day (QID) | INTRAMUSCULAR | Status: DC | PRN

## 2011-12-19 MED ORDER — MIDAZOLAM HCL 2 MG/2ML IJ SOLN
INTRAMUSCULAR | Status: AC
  Filled 2011-12-19: qty 2

## 2011-12-19 MED ORDER — LIDOCAINE HCL (PF) 1 % IJ SOLN
INTRAMUSCULAR | Status: AC
  Filled 2011-12-19: qty 30

## 2011-12-19 MED ORDER — ISOSORBIDE MONONITRATE ER 60 MG PO TB24
60.0000 mg | ORAL_TABLET | Freq: Every day | ORAL | Status: DC
  Administered 2011-12-19 – 2011-12-20 (×2): 60 mg via ORAL
  Filled 2011-12-19 (×2): qty 1

## 2011-12-19 MED ORDER — ASPIRIN 81 MG PO CHEW
324.0000 mg | CHEWABLE_TABLET | ORAL | Status: AC
  Administered 2011-12-19: 324 mg via ORAL
  Filled 2011-12-19: qty 4

## 2011-12-19 MED ORDER — SODIUM CHLORIDE 0.9 % IJ SOLN: 3.0000 mL | INTRAMUSCULAR | Status: DC | PRN

## 2011-12-19 MED ORDER — SODIUM CHLORIDE 0.9 % IJ SOLN: 3.0000 mL | Freq: Two times a day (BID) | INTRAMUSCULAR | Status: DC

## 2011-12-19 NOTE — CV Procedure (Signed)
   Cardiac Catheterization Procedure Note  Name: [name] [name] MRN: [id number] DOB: 1956/01/08  Procedure: Left Heart Cath, Selective Coronary Angiography, LV angiography  Indication: Unstable angina   Procedural Details: The right wrist was prepped, draped, and anesthetized with 1% lidocaine. Using the modified Seldinger technique, a 5 French sheath was introduced into the right radial artery. 3 mg of verapamil was administered through the sheath, weight-based unfractionated heparin was administered intravenously. Standard Judkins catheters were used for selective coronary angiography and left ventriculography. Catheter exchanges were performed over an exchange length guidewire. There were no immediate procedural complications. A TR band was used for radial hemostasis at the completion of the procedure.  The patient was transferred to the post catheterization recovery area for further monitoring.  Procedural Findings: Hemodynamics: AO 95/51 LV 113/6  Coronary angiography: Coronary dominance: right  Left mainstem: Short, no significant disease.   Left anterior descending (LAD): 40% proximal LAD stenosis, diffuse up to 50% mid to distal LAD stenosis. 70% ostial D1.    Left circumflex (LCx): Large high OM1 with 50% proximal stenosis and 50% mid stenosis.  90% ostial stenosis of a small 1st branch off OM1 and 90% ostial stenosis of a small 2nd branch off OM1.  50% stenosis of the mid AV LCx beyond the OM1.    Right coronary artery (RCA): Luminal irregularities in the body of the RCA.  50% mid PDA stenosis, 60% distal PDA stenosis.   Left ventriculography: Mild global hypokinesis, EF 45%, there is no significant mitral regurgitation   Final Conclusions:  Diffuse distal and branch vessel CAD.  No severe disease in the major coronaries.  His significant distal and branch vessel disease could certainly be causing angina.    Recommendations: Medical management.  I will start Imdur 60 mg  daily.  He will need to get his diabetes under control.   Marca Ancona 12/19/2011, 3:49 PM

## 2011-12-19 NOTE — Progress Notes (Signed)
Patient ID: [name] [name], male   DOB: 17-Jun-1956, 56 y.o.   MRN: [id number]    SUBJECTIVE: Doing well this am, no chest pain.  I spoke with [name] [name] at length this am about his symptoms.  He has been having exertional chest pressure, resolving with rest, for about 2 wks.  These symptoms pre-date running out of his BP meds earlier this week.      [name] allopurinol  300 mg Oral Daily  . aspirin EC  81 mg Oral Daily  . atorvastatin  40 mg Oral q1800  . carvedilol  25 mg Oral BID WC  . furosemide  80 mg Oral Daily  . heparin  5,000 Units Subcutaneous Q8H  . insulin aspart  0-5 Units Subcutaneous QHS  . insulin aspart  0-9 Units Subcutaneous TID WC  . insulin aspart  10 Units Subcutaneous Once  . insulin glargine  40 Units Subcutaneous QHS  . losartan  100 mg Oral Daily  . potassium chloride  20 mEq Oral Daily  . regadenoson  0.4 mg Intravenous Once  . sodium chloride  3 mL Intravenous Q12H  . warfarin  7.5 mg Oral ONCE-1800  . Warfarin - Pharmacist Dosing Inpatient   Does not apply q1800  . DISCONTD: digoxin  250 mcg Oral Daily      Filed Vitals:   12/18/11 1400 12/18/11 1641 12/18/11 2050 12/19/11 0506  BP: 162/89 158/69 135/81 143/75  Pulse:  71 73 68  Temp: 98.7 F (37.1 C)  97.8 F (36.6 C) 98.5 F (36.9 C)  TempSrc: Oral  Axillary Oral  Resp: 18  18 20   Height: 6' (1.829 m)     Weight: 192 lb 0.3 oz (87.1 kg)   193 lb (87.544 kg)  SpO2:   94% 97%    Intake/Output Summary (Last 24 hours) at 12/19/11 0812 Last data filed at 12/18/11 2051  Gross per 24 hour  Intake    480 ml  Output   1000 ml  Net   -520 ml    LABS: Basic Metabolic Panel:  Basename 12/19/11 0555 12/18/11 1120  NA 134* 133*  K 3.7 3.7  CL 99 95*  CO2 27 28  GLUCOSE 266* 357*  BUN 17 14  CREATININE 0.95 0.98  CALCIUM 9.7 10.2  MG -- 2.0  PHOS -- --   Liver Function Tests:  Basename 12/18/11 1120  AST 11  ALT 11  ALKPHOS 69  BILITOT 0.4  PROT 8.1  ALBUMIN 3.8   No results found  for this basename: LIPASE:2,AMYLASE:2 in the last 72 hours CBC:  Basename 12/18/11 1120  WBC 6.6  NEUTROABS --  HGB 15.3  HCT 43.3  MCV 80.6  PLT 177   Cardiac Enzymes:  Basename 12/18/11 1120  CKTOTAL 131  CKMB 2.1  CKMBINDEX --  TROPONINI <0.30   BNP: No components found with this basename: POCBNP:3 D-Dimer: No results found for this basename: DDIMER:2 in the last 72 hours Hemoglobin A1C: No results found for this basename: HGBA1C in the last 72 hours Fasting Lipid Panel:  Basename 12/18/11 1127  CHOL 167  HDL 57  LDLCALC 85  TRIG 124  CHOLHDL 2.9  LDLDIRECT --   Thyroid Function Tests:  Basename 12/18/11 1120  TSH 0.996  T4TOTAL --  T3FREE --  THYROIDAB --   Anemia Panel: No results found for this basename: VITAMINB12,FOLATE,FERRITIN,TIBC,IRON,RETICCTPCT in the last 72 hours  RADIOLOGY: No results found.  PHYSICAL EXAM General: NAD Neck: No JVD, no thyromegaly or  thyroid nodule.  Lungs: Clear to auscultation bilaterally with normal respiratory effort. CV: Nondisplaced PMI.  Heart regular S1/S2, no S3/S4, no murmur.  No peripheral edema.  No carotid bruit.  Normal pedal pulses.  Abdomen: Soft, nontender, no hepatosplenomegaly, no distention.  Neurologic: Alert and oriented x 3.  Psych: Normal affect. Extremities: No clubbing or cyanosis.   TELEMETRY: Reviewed telemetry pt in NSR  ASSESSMENT AND PLAN:  56 yo with history of HTN, PAF, and nonischemic cardiomyopathy presented with exertional chest pain.  He had a cath in 2010 with nonobstructive disease.  1. Chest pain: Pain is clearly exertional and relieved by rest.  Has been present x 2 wks.  Given this history, he is at high risk for having obstructive CAD.  He is on coumadin but INR is 1.8 today.  I talked at length with him about options.  Given symptoms, I would still have some concern even with a negative stress test. I will arrange cardiac cath today.  Will use radial access given elevated INR.    2. PAF: He is in NSR.  EF is now normal.  I am going to stop digoxin.  3. Cardiomyopathy: Prior history of nonischemic CMP.  EF was 60-65% on echo yesterday, however.  He does not appear volume overloaded on exam.  He may not need such a high Lasix dose at home.  4. HTN: Out of BP meds for about a week, now restarted. Will additionally add amlodipine.   [name] [name] 12/19/2011 8:19 AM

## 2011-12-19 NOTE — Progress Notes (Signed)
ANTICOAGULATION CONSULT NOTE - Follow Up Consult  Pharmacy Consult for coumadin Indication: atrial fibrillation  Not on File  Patient Measurements: Height: 6' (182.9 cm) Weight: 193 lb (87.544 kg) IBW/kg (Calculated) : 77.6  Heparin Dosing Weight:   Vital Signs: Temp: 97.9 F (36.6 C) (05/08 1345) Temp src: Oral (05/08 1345) BP: 123/72 mmHg (05/08 1345) Pulse Rate: 68  (05/08 1428)  Labs:  Basename 12/19/11 0835 12/19/11 0555 12/18/11 1120  HGB -- -- 15.3  HCT -- -- 43.3  PLT -- -- 177  APTT -- -- 43*  LABPROT -- 21.4* 18.8*  INR -- 1.82* 1.54*  HEPARINUNFRC -- -- --  CREATININE -- 0.95 0.98  CKTOTAL 102 -- 131  CKMB 2.5 -- 2.1  TROPONINI <0.30 -- <0.30    Estimated Creatinine Clearance: 94.2 ml/min (by C-G formula based on Cr of 0.95).   Medications:  Scheduled:    . allopurinol  300 mg Oral Daily  . amLODipine  5 mg Oral Daily  . aspirin  324 mg Oral Pre-Cath  . aspirin EC  81 mg Oral Daily  . atorvastatin  40 mg Oral q1800  . carvedilol  25 mg Oral BID WC  . fentaNYL      . furosemide  40 mg Oral Daily  . heparin      . heparin      . insulin aspart  0-5 Units Subcutaneous QHS  . insulin aspart  0-9 Units Subcutaneous TID WC  . insulin glargine  40 Units Subcutaneous QHS  . isosorbide mononitrate  60 mg Oral Daily  . lidocaine      . living well with diabetes book   Does not apply Once  . losartan  100 mg Oral Daily  . midazolam      . nitroGLYCERIN      . potassium chloride  20 mEq Oral Daily  . regadenoson  0.4 mg Intravenous Once  . sodium chloride  3 mL Intravenous Q12H  . Warfarin - Pharmacist Dosing Inpatient   Does not apply q1800  . DISCONTD: digoxin  250 mcg Oral Daily  . DISCONTD: furosemide  80 mg Oral Daily  . DISCONTD: heparin  5,000 Units Subcutaneous Q8H  . DISCONTD: sodium chloride  3 mL Intravenous Q12H    Assessment: 56 yo M with h/o afib on warfarin PTA - home dose noted to be 5 mg alternating with 2.5 mg.  Questionable  compliance, INR on admission 1.54.  Now s/p cath with plans for medical management and orders to resume warfarin dosing.  Goal of Therapy:  INR 2-3 Monitor platelets by anticoagulation protocol: Yes   Plan:  1.  Repeat warfarin 7.5 mg po x1 2.  Continue daily INR  [name] [name], PharmD, BCPS Clinical Pharmacist Pager: [phone] 12/19/2011 6:32 PM

## 2011-12-20 ENCOUNTER — Encounter (HOSPITAL_COMMUNITY): Payer: Self-pay | Admitting: Cardiology

## 2011-12-20 DIAGNOSIS — I10 Essential (primary) hypertension: Secondary | ICD-10-CM

## 2011-12-20 LAB — CBC
HCT: 42.4 % (ref 39.0–52.0)
Hemoglobin: 14 g/dL (ref 13.0–17.0)
MCHC: 33 g/dL (ref 30.0–36.0)
MCV: 82.3 fL (ref 78.0–100.0)
WBC: 5.4 10*3/uL (ref 4.0–10.5)

## 2011-12-20 LAB — BASIC METABOLIC PANEL
BUN: 17 mg/dL (ref 6–23)
CO2: 24 mEq/L (ref 19–32)
Chloride: 103 mEq/L (ref 96–112)
GFR calc Af Amer: >90 mL/min (ref >90)
Potassium: 3.6 mEq/L (ref 3.5–5.1)

## 2011-12-20 LAB — GLUCOSE, CAPILLARY

## 2011-12-20 LAB — PROTIME-INR: Prothrombin Time: 24.5 seconds — ABNORMAL HIGH (ref 11.6–15.2)

## 2011-12-20 MED ORDER — AMLODIPINE BESYLATE 5 MG PO TABS: 5.0000 mg | ORAL_TABLET | Freq: Every day | ORAL | Status: DC

## 2011-12-20 MED ORDER — FUROSEMIDE 40 MG PO TABS: 40.0000 mg | ORAL_TABLET | Freq: Every day | ORAL | Status: DC

## 2011-12-20 MED ORDER — WARFARIN SODIUM 5 MG PO TABS
5.0000 mg | ORAL_TABLET | Freq: Once | ORAL | Status: DC
  Filled 2011-12-20: qty 1

## 2011-12-20 MED ORDER — ISOSORBIDE MONONITRATE ER 60 MG PO TB24: 60.0000 mg | ORAL_TABLET | Freq: Every day | ORAL | Status: DC

## 2011-12-20 MED ORDER — NITROGLYCERIN 0.4 MG SL SUBL: 0.4000 mg | SUBLINGUAL_TABLET | SUBLINGUAL | Status: DC | PRN

## 2011-12-20 MED ORDER — WARFARIN SODIUM 5 MG PO TABS: 2.5000 mg | ORAL_TABLET | Freq: Every day | ORAL | Status: DC

## 2011-12-20 MED ORDER — ASPIRIN 81 MG PO TBEC: 81.0000 mg | DELAYED_RELEASE_TABLET | Freq: Every day | ORAL | Status: DC

## 2011-12-20 MED FILL — Nicardipine HCl IV Soln 2.5 MG/ML: INTRAVENOUS | Qty: 1 | Status: AC

## 2011-12-20 NOTE — Discharge Instructions (Signed)
Cardiac Diet This diet can help prevent heart disease and stroke. Many factors influence your heart health, including eating and exercise habits. Coronary risk rises a lot with abnormal blood fat (lipid) levels. Cardiac meal planning includes limiting unhealthy fats, increasing healthy fats, and making other small dietary changes. General guidelines are as follows:  Adjust calorie intake to reach and maintain desirable body weight.   Limit total fat intake to less than 30% of total calories. Saturated fat should be less than 7% of calories.   Saturated fats are found in animal products and in some vegetable products. Saturated vegetable fats are found in coconut oil, cocoa butter, palm oil, and palm kernel oil. Read labels carefully to avoid these products as much as possible. Use butter in moderation. Choose tub margarines and oils that have 2 grams of fat or less. Good cooking oils are canola and olive oils.   Practice low-fat cooking techniques. Do not fry food. Instead, broil, bake, boil, steam, grill, roast on a rack, stir-fry, or microwave it. Other fat reducing suggestions include:   Remove the skin from poultry.   Remove all visible fat from meats.   Skim the fat off stews, soups, and gravies before serving them.   Steam vegetables in water or broth instead of sauting them in fat.   Avoid foods with trans fat (or hydrogenated oils), such as commercially fried foods and commercially baked goods. Commercial shortening and deep-frying fats will contain trans fat.   Increase intake of fruits, vegetables, whole grains, and legumes to replace foods high in fat.   Increase consumption of nuts, legumes, and seeds to at least 4 servings weekly. One serving of a legume equals  cup, and 1 serving of nuts or seeds equals  cup.   Choose whole grains more often. Have 3 servings per day (a serving is 1 ounce [oz]).   Have at least 4 cups of fruit and vegetable a day.   Increase your intake  of soluble fiber to 10 to 25 grams per day. Soluble fiber binds cholesterol to be removed from the blood. Foods high in soluble fiber are dried beans, citrus fruits, oats, apples, bananas, broccoli, Brussels sprouts, and eggplant.   Try to include foods fortified with plant sterols or stanols, such as yogurt, breads, juices, or margarines. Choose several fortified foods to achieve a daily intake of 2 to 3 grams of plant sterols or stanols.   Foods with omega-3 fats can help reduce your risk of heart disease. Aim to have a 3.5 oz portion of fatty fish twice per week, such as salmon, mackerel, albacore tuna, sardines, lake trout, or herring. If you wish to take a fish oil supplement, choose one that contains 1 gram of both DHA and EPA.   Limit processed meats to 2 servings (3 oz portion) weekly.   Limit the sodium in your diet to 1500 milligrams (mg) per day. If you have high blood pressure, talk to a registered dietitian about a DASH (Dietary Approaches to Stop Hypertension) eating plan.   Limit beverages with added sugar, such as soda, to no more than 36 ounces per week.  CHOOSING FOODS Starches  Allowed: Breads: All kinds (wheat, rye, raisin, white, oatmeal, [place], [place], and English muffin bread). Low-fat rolls: English muffins, frankfurter and hamburger buns, bagels, pita bread, tortillas (not fried). Pancakes, waffles, biscuits, and muffins made with recommended oil.   Avoid: Products made with saturated or trans fats, oils, or whole milk products. Butter rolls, cheese breads,  croissants. Commercial doughnuts, muffins, sweet rolls, biscuits, waffles, pancakes, store-bought mixes.  Crackers  Allowed: Low-fat crackers and snacks: Animal, graham, rye, saltine (with recommended oil, no lard), oyster, and matzo crackers. Bread sticks, melba toast, rusks, flatbread, pretzels, and light popcorn.   Avoid: High-fat crackers: cheese crackers, butter crackers, and those made with coconut, palm oil,  or trans fat (hydrogenated oils). Buttered popcorn.  Cereals  Allowed: Hot or cold whole-grain cereals.   Avoid: Cereals containing coconut, hydrogenated vegetable fat, or animal fat.  Potatoes / Pasta / Rice  Allowed: All kinds of potatoes, rice, and pasta (such as macaroni, spaghetti, and noodles).   Avoid: Pasta or rice prepared with cream sauce or high-fat cheese. Chow mein noodles, [place] fries.  Vegetables  Allowed: All vegetables and vegetable juices.   Avoid: Fried vegetables. Vegetables in cream, butter, or high-fat cheese sauces. Limit coconut. Fruit in cream or custard.  Meat and Meat Substitutes  Allowed: Limit your intake of meat, seafood, and poultry to no more than 6 oz (cooked weight) per day. All lean, well-trimmed beef, veal, pork, and lamb. All chicken and [place] without skin. All fish and shellfish. Wild game: wild duck, rabbit, pheasant, and venison. Meatless dishes: recipes with dried beans, peas, lentils, and tofu (soybean curd). Seeds and nuts: all seeds and most nuts.   Avoid: Prime grade and other heavily marbled and fatty meats, such as short ribs, spare ribs, rib eye roast or steak, frankfurters, sausage, bacon, and high-fat luncheon meats, mutton. Caviar. Commercially fried fish. Domestic duck, goose, venison sausage. Organ meats: liver, gizzard, heart, chitterlings, brains, kidney, sweetbreads.  Dairy  Allowed: Egg whites or low-cholesterol egg substitutes may be used as desired. Low-fat cheeses: nonfat or low-fat cottage cheese (1% or 2% fat), cheeses made with part skim milk, such as mozzarella, farmers, string, or ricotta. (Cheeses should be labeled no more than 2 to 6 grams fat per oz.)   Avoid: Whole milk cheeses, including colby, cheddar, mu[place]ster, Monterey Jack, [place], [place], [place], American, Swiss, and blue. Creamed cottage cheese, cream cheese.  Milk  Allowed: Skim (or 1%) milk: liquid, powdered, or evaporated. Buttermilk made with low-fat milk.  Drinks made with skim or low-fat milk or cocoa. Chocolate milk or cocoa made with skim or low-fat (1%) milk. Nonfat or low-fat yogurt.   Avoid: Whole milk and whole milk products, including buttermilk or yogurt made from whole milk, drinks made from whole milk. Condensed milk, evaporated whole milk, and 2% milk.  Soups and Combination Foods  Allowed: Low-fat low-sodium soups: broth, dehydrated soups, homemade broth, soups with the fat removed, homemade cream soups made with skim or low-fat milk. Low-fat spaghetti, lasagna, chili, and Spanish rice if low-fat ingredients and low-fat cooking techniques are used.   Avoid: Cream soups made with whole milk, cream, or high-fat cheese. All other soups.  Desserts and Sweets  Allowed: Sherbet, fruit ices, gelatins, meringues, and angel food cake. Homemade desserts with recommended fats, oils, and milk products. Jam, jelly, honey, marmalade, sugars, and syrups. Pure sugar candy, such as gum drops, hard candy, jelly beans, marshmallows, mints, and small amounts of dark chocolate.   Avoid: Commercially prepared cakes, pies, cookies, frosting, pudding, or mixes for these products. Desserts containing whole milk products, chocolate, coconut, lard, palm oil, or palm kernel oil. Ice cream or ice cream drinks. Candy that contains chocolate, coconut, butter, hydrogenated fat, or unknown ingredients. Buttered syrups.  Fats and Oils  Allowed: Vegetable oils: safflower, sunflower, corn, soybean, cottonseed, sesame, canola, olive, or  peanut. Non-hydrogenated margarines. Salad dressing or mayonnaise: homemade or commercial, made with a recommended oil. Low or nonfat salad dressing or mayonnaise.   Limit added fats and oils to 6 to 8 tsp per day (includes fats used in cooking, baking, salads, and spreads on bread). Remember to count the "hidden fats" in foods.   Avoid: Solid fats and shortenings: butter, lard, salt pork, bacon drippings. Gravy containing meat fat,  shortening, or suet. Cocoa butter, coconut. Coconut oil, palm oil, palm kernel oil, or hydrogenated oils: these ingredients are often used in bakery products, nondairy creamers, whipped toppings, candy, and commercially fried foods. Read labels carefully. Salad dressings made of unknown oils, sour cream, or cheese, such as blue cheese and Roquefort. Cream, all kinds: half-and-half, light, heavy, or whipping. Sour cream or cream cheese (even if "light" or low-fat). Nondairy cream substitutes: coffee creamers and sour cream substitutes made with palm, palm kernel, hydrogenated oils, or coconut oil.  Beverages  Allowed: Coffee (regular or decaffeinated), tea. diet carbonated beverages, mineral water. Alcohol: Check with your caregiver. Moderation is recommended.   Avoid: Whole milk, regular sodas, and juice drinks with added sugar.  Condiments  Allowed: All seasonings and condiments. Cocoa powder. "Cream" sauces made with recommended ingredients.   Avoid: Carob powder made with hydrogenated fats.  SAMPLE MENU Breakfast   cup orange juice    cup oatmeal   1 slice toast   1 tsp margarine   1 cup skim milk  Lunch  [place] sandwich with 2 oz [place], 2 slices bread   Lettuce and tomato slices   Fresh fruit   Carrot sticks   Coffee or tea   Snack   Fresh fruit or low-fat crackers  Dinner  3 oz lean ground beef   1 baked potato   1 tsp margarine    cup asparagus   Lettuce salad   1 tbs non-creamy dressing    cup peach slices   1 cup skim milk  Document Released: 05/08/2008 Document Revised: 07/19/2011 Document Reviewed: 08/01/2010 [facility] Patient Information 2012 [place], [place].  Isosorbide Mononitrate tablets What is this medicine? ISOSORBIDE MONONITRATE (eye soe SOR bide mon oh NYE trate) is a type of vasodilator. It relaxes blood vessels, increasing the blood and oxygen supply to your heart. This medicine is used to prevent chest pain caused by angina. It  will not help to stop an episode of chest pain. This medicine may be used for other purposes; ask your health care provider or pharmacist if you have questions. What should I tell my health care provider before I take this medicine? They need to know if you have any of these conditions: -previous heart attack or heart failure -an unusual or allergic reaction to isosorbide mononitrate, nitrates, other medicines, foods, dyes, or preservatives -pregnant or trying to get pregnant -breast-feeding How should I use this medicine? Take this medicine by mouth with a glass of water. Follow the directions on the prescription label. Take your medicine at regular intervals. Do not take your medicine more often than directed. Do not stop taking this medicine suddenly or your symptoms may get worse. Ask your doctor or health care professional how to gradually reduce the dose. Talk to your pediatrician regarding the use of this medicine in children. Special care may be needed. Overdosage: If you think you have taken too much of this medicine contact a poison control center or emergency room at once. NOTE: This medicine is only for you. Do not share this medicine with  others. What if I miss a dose? If you miss a dose, take it as soon as you can. If it is almost time for your next dose, take only that dose. Do not take double or extra doses. What may interact with this medicine? Do not take this medicine with any of the following medications: -medicines used to treat erectile dysfunction (ED) like sildenafil, tadalafil, and vardenafil This medicine may also interact with the following medications: -medicines for high blood pressure -other medicines for angina or heart failure This list may not describe all possible interactions. Give your health care provider a list of all the medicines, herbs, non-prescription drugs, or dietary supplements you use. Also tell them if you smoke, drink alcohol, or use illegal  drugs. Some items may interact with your medicine. What should I watch for while using this medicine? Check your heart rate and blood pressure regularly while you are taking this medicine. Ask your doctor or health care professional what your heart rate and blood pressure should be and when you should contact him or her. Tell your doctor or health care professional if you feel your medicine is no longer working. You may get dizzy. Do not drive, use machinery, or do anything that needs mental alertness until you know how this medicine affects you. To reduce the risk of dizzy or fainting spells, do not sit or stand up quickly, especially if you are an older patient. Alcohol can make you more dizzy, and increase flushing and rapid heartbeats. Avoid alcoholic drinks. Do not treat yourself for coughs, colds, or pain while you are taking this medicine without asking your doctor or health care professional for advice. Some ingredients may increase your blood pressure. What side effects may I notice from receiving this medicine? Side effects that you should report to your doctor or health care professional as soon as possible: -bluish discoloration of lips, fingernails, or palms of hands -irregular heartbeat, palpitations -low blood pressure -nausea, vomiting -persistent headache -unusually weak or tired Side effects that usually do not require medical attention (report to your doctor or health care professional if they continue or are bothersome): -flushing of the face or neck -rash This list may not describe all possible side effects. Call your doctor for medical advice about side effects. You may report side effects to FDA at 1-800-FDA-1088. Where should I keep my medicine? Keep out of the reach of children. Store between 15 and 30 degrees C (59 and 86 degrees F). Keep container tightly closed. Throw away any unused medicine after the expiration date. NOTE: This sheet is a summary. It may not cover  all possible information. If you have questions about this medicine, talk to your doctor, pharmacist, or health care provider.  2012, [employer]vier/Gold Standard. (03/07/2009 3:00:35 PM)  Radial Site Care Refer to this sheet in the next few weeks. These instructions provide you with information on caring for yourself after your procedure. Your caregiver may also give you more specific instructions. Your treatment has been planned according to current medical practices, but problems sometimes occur. Call your caregiver if you have any problems or questions after your procedure. HOME CARE INSTRUCTIONS  You may shower the day after the procedure.Remove the bandage (dressing) and gently wash the site with plain soap and water.Gently pat the site dry.   Do not apply powder or lotion to the site.   Do not submerge the affected site in water for 3 to 5 days.   Inspect the site at least twice  daily.   Do not flex or bend the affected arm for 24 hours.   No lifting over 5 pounds (2.3 kg) for 5 days after your procedure.   Do not drive home if you are discharged the same day of the procedure. Have someone else drive you.   You may drive 24 hours after the procedure unless otherwise instructed by your caregiver.   Do not operate machinery or power tools for 24 hours.   A responsible adult should be with you for the first 24 hours after you arrive home.  What to expect:  Any bruising will usually fade within 1 to 2 weeks.   Blood that collects in the tissue (hematoma) may be painful to the touch. It should usually decrease in size and tenderness within 1 to 2 weeks.  SEEK IMMEDIATE MEDICAL CARE IF:  You have unusual pain at the radial site.   You have redness, warmth, swelling, or pain at the radial site.   You have drainage (other than a small amount of blood on the dressing).   You have chills.   You have a fever or persistent symptoms for more than 72 hours.   You have a fever and your  symptoms suddenly get worse.   Your arm becomes pale, cool, tingly, or numb.   You have heavy bleeding from the site. Hold pressure on the site.  Document Released: 09/01/2010 Document Revised: 07/19/2011 Document Reviewed: 09/01/2010 [facility] Patient Information 2012 [place], [place].

## 2011-12-20 NOTE — Care Management Note (Signed)
    Page 1 of 1   12/20/2011     10:35:28 AM   CARE MANAGEMENT NOTE 12/20/2011  Patient:  [name] [name], [name] [name]   Account Number:  [id number]  Date Initiated:  12/20/2011  Documentation initiated by:  GRAVES-BIGELOW,Kaelani Kendrick  Subjective/Objective Assessment:   Pt admitted with cp- s/p cath. Plan for medical management. Pt is from home with wife. Pt is unemployed and is trying to find job. Pt has PCP at [facility].     Action/Plan:   CM did provide pt with information on the [facility] center. CM also faxed medications to [facility] pharmacy and made pt f/u apt at [facility] to add on coumadin clinic for Monday May 13. Also called [facility] for bill assist.   Anticipated DC Date:  12/20/2011   Anticipated DC Plan:  HOME/SELF CARE  In-house referral  Clinical Social Worker  Artist      DC Planning Services  [facility]  CM consult      Choice offered to / List presented to:             Status of service:  Completed, signed off Medicare Important Message given?   (If response is "NO", the following Medicare IM given date fields will be blank) Date Medicare IM given:   Date Additional Medicare IM given:    Discharge Disposition:  HOME/SELF CARE  Per UR Regulation:    If discussed at Long Length of Stay Meetings, dates discussed:    Comments:  12-20-11 1033 Tomi Bamberger, RN,BSN [phone] No other needs for CM at this time.

## 2011-12-20 NOTE — Progress Notes (Signed)
Patient ID: [name] [name], male   DOB: September 23, 1955, 56 y.o.   MRN: [id number]    SUBJECTIVE: Doing well this am, no chest pain.  Cath yesterday showed diffuse distal and branch vessel disease.     [name] allopurinol  300 mg Oral Daily  . amLODipine  5 mg Oral Daily  . aspirin  324 mg Oral Pre-Cath  . aspirin EC  81 mg Oral Daily  . atorvastatin  40 mg Oral q1800  . carvedilol  25 mg Oral BID WC  . fentaNYL      . furosemide  40 mg Oral Daily  . heparin      . heparin      . insulin aspart  0-5 Units Subcutaneous QHS  . insulin aspart  0-9 Units Subcutaneous TID WC  . insulin glargine  40 Units Subcutaneous QHS  . isosorbide mononitrate  60 mg Oral Daily  . lidocaine      . living well with diabetes book   Does not apply Once  . losartan  100 mg Oral Daily  . midazolam      . nitroGLYCERIN      . potassium chloride  20 mEq Oral Daily  . regadenoson  0.4 mg Intravenous Once  . sodium chloride  3 mL Intravenous Q12H  . warfarin  7.5 mg Oral ONCE-1800  . Warfarin - Pharmacist Dosing Inpatient   Does not apply q1800  . DISCONTD: digoxin  250 mcg Oral Daily  . DISCONTD: furosemide  80 mg Oral Daily  . DISCONTD: heparin  5,000 Units Subcutaneous Q8H  . DISCONTD: sodium chloride  3 mL Intravenous Q12H      Filed Vitals:   12/19/11 1345 12/19/11 1428 12/19/11 2040 12/20/11 0524  BP: 123/72  113/64 106/65  Pulse: 58 68 70 74  Temp: 97.9 F (36.6 C)  98.7 F (37.1 C) 98.2 F (36.8 C)  TempSrc: Oral  Oral Oral  Resp: 20  18 18   Height:      Weight:    197 lb 1.6 oz (89.404 kg)  SpO2: 96%  97% 95%   No intake or output data in the 24 hours ending 12/20/11 0746  LABS: Basic Metabolic Panel:  Basename 12/20/11 0525 12/19/11 0555 12/18/11 1120  NA 136 134* --  K 3.6 3.7 --  CL 103 99 --  CO2 24 27 --  GLUCOSE 219* 266* --  BUN 17 17 --  CREATININE 0.93 0.95 --  CALCIUM 9.1 9.7 --  MG -- -- 2.0  PHOS -- -- --   Liver Function Tests:  Basename 12/18/11 1120  AST 11    ALT 11  ALKPHOS 69  BILITOT 0.4  PROT 8.1  ALBUMIN 3.8   No results found for this basename: LIPASE:2,AMYLASE:2 in the last 72 hours CBC:  Basename 12/20/11 0525 12/18/11 1120  WBC 5.4 6.6  NEUTROABS -- --  HGB 14.0 15.3  HCT 42.4 43.3  MCV 82.3 80.6  PLT 144* 177   Cardiac Enzymes:  Basename 12/19/11 0835 12/18/11 1120  CKTOTAL 102 131  CKMB 2.5 2.1  CKMBINDEX -- --  TROPONINI <0.30 <0.30   BNP: No components found with this basename: POCBNP:3 D-Dimer: No results found for this basename: DDIMER:2 in the last 72 hours Hemoglobin A1C:  Basename 12/18/11 1120  HGBA1C 14.6*   Fasting Lipid Panel:  Basename 12/18/11 1127  CHOL 167  HDL 57  LDLCALC 85  TRIG 124  CHOLHDL 2.9  LDLDIRECT --   Thyroid Function  Tests:  Basename 12/18/11 1120  TSH 0.996  T4TOTAL --  T3FREE --  THYROIDAB --   Anemia Panel: No results found for this basename: VITAMINB12,FOLATE,FERRITIN,TIBC,IRON,RETICCTPCT in the last 72 hours  RADIOLOGY: No results found.  PHYSICAL EXAM General: NAD Neck: JVP 7-8 cm, no thyromegaly or thyroid nodule.  Lungs: Clear to auscultation bilaterally with normal respiratory effort. CV: Nondisplaced PMI.  Heart regular S1/S2, no S3/S4, no murmur.  No peripheral edema.  No carotid bruit.  Normal pedal pulses.  Abdomen: Soft, nontender, no hepatosplenomegaly, no distention.  Neurologic: Alert and oriented x 3.  Psych: Normal affect. Extremities: No clubbing or cyanosis.  Radial artery cath site benign.   TELEMETRY: Reviewed telemetry pt in NSR  ASSESSMENT AND PLAN:  56 yo with history of HTN, PAF, and nonischemic cardiomyopathy presented with exertional chest pain.   1. CAD: Patient had diffuse distal and branch vessel CAD.  No target for intervention.  This disease could cause angina.  Continue ASA, statin, Coreg.  I added imdur 60 mg daily and amlodipine 5 mg daily.  2. PAF: He is in NSR.  INR is therapeutic (followed at [facility]).  I stopped  digoxin.  3. CHF: EF 45% by LV-gram.  Suspect mild ischemic cardiomyopathy.  Volume status ok.  Continue Coreg, losartan, Lasix 40 mg daily.   4. HTN: BP controlled on meds.  5. Dispo: May go home today.  Needs followup with me in 7-10 days in office.  Needs INR followup (says he goes to [facility] for this but can follow at our office alternatively).  Meds: warfarin, amlodipine 5 mg daily, ASA 81 mg daily (will stop if CAD remains stable), losartan 100 mg daily, Coreg 25 mg bid, Imdur 60 mg daily, atorvastatin 40 mg daily, KCl 20 mEq daily, Lasix 40 mg daily.  Non-cardiac meds to continue per home regimen.  Needs followup with PCP for diabetes soon.   Marca Ancona 12/20/2011 7:46 AM

## 2011-12-20 NOTE — Discharge Summary (Addendum)
Discharge Summary   Patient ID: [name] [name] MRN: [id number], DOB/AGE: 21-Jun-1956 56 y.o.  Primary MD: Lehman Prom, NP Primary Cardiologist: Macarthur Critchley  Admit date: 12/18/2011 D/C date:     12/20/2011      Primary Discharge Diagnoses:  1. Chest pain/Nonobstructive CAD  - negative cardiac enzymes, no acute EKG changes  - Echo 12/19/11 revealed mod LVH, EF 60-65%, mild LAE, no RWMAs  - Cardiac cath 12/19/11 - diffuse distal and branch vessel CAD, no severe disease in the major coronaries, LV mild global hypokinesis, EF 45%. Medical management  - Imdur added  2. H/o LV dysfunction   - previous low EF 2008. EF 60-65% by echo 12/2011  - No signs of volume overload. Lasix dose decreased to 40mg  daily  3. Hypertension  - Uncontrolled due to medication noncompliance  - Improved with resumption of home meds. Amlodipine added.  4. Diabetes Mellitus, Type 2  - Uncontrolled due to medication noncompliance  - A1C 14.6  - Needs close follow up with PCP  5. Atrial Fibrillation, on coumadin  - In NSR this admission, Digoxin discontinued  - INR subtherapeutic upon presentation; INR 2.16 at discharge  - Will follow up with [facility]   Secondary Discharge Diagnoses:  1. HLD - LDL 85 on statin, goal <70 2. Mitral Regurgitation 3. Chronic Hepatitis B 4. H/o Colonic Polyps 5. Rectal Bleeding 6. Gout 7. Osteochondroma, Left Shoulder 8. H/o Abnormal LFTs  Allergies Allergies  Allergen Reactions  . Other Hives    Patient reports developing hives after receiving "some antibiotic years ago at [facility]". He does not know which antibiotic.    Diagnostic Studies/Procedures:   12/19/11 - Cardiac Cath Hemodynamics:  AO 95/51  LV 113/6  Coronary angiography:  Coronary dominance: right  Left mainstem: Short, no significant disease.  Left anterior descending (LAD): 40% proximal LAD stenosis, diffuse up to 50% mid to distal LAD stenosis. 70% ostial  D1.  Left circumflex (LCx): Large high OM1 with 50% proximal stenosis and 50% mid stenosis. 90% ostial stenosis of a small 1st branch off OM1 and 90% ostial stenosis of a small 2nd branch off OM1. 50% stenosis of the mid AV LCx beyond the OM1.  Right coronary artery (RCA): Luminal irregularities in the body of the RCA. 50% mid PDA stenosis, 60% distal PDA stenosis.  Left ventriculography: Mild global hypokinesis, EF 45%, there is no significant mitral regurgitation  Final Conclusions: Diffuse distal and branch vessel CAD. No severe disease in the major coronaries. His significant distal and branch vessel disease could certainly be causing angina.  Recommendations: Medical management. I will start Imdur 60 mg daily. He will need to get his diabetes under control.   12/18/11 - 2D Echocardiogram Study Conclusions:  - Left ventricle: Wall thickness was increased in a pattern of moderate LVH. Systolic function was normal. The estimated ejection fraction was in the range of 60% to 65%. - Left atrium: The atrium was mildly dilated.  History of Present Illness: 56 y.o. male w/ the above medical problems who presented to [facility] on 12/18/11 with complaints of chest pain.  He initially presented to his PCP office for BP med refill and reported chest pressure with exertion for 3 weeks. He noted that he would walk in the grocery store and would experience chest pressure 9/10 in severity radiating to his left arm. His PCP called EMS who obtained an EKG with concern for STEMI.   Hospital Course: In the ED  the EKG was reviewed and felt to be LVH with secondary repolarization abnormalities unchanged from prior EKG and STEMI code was called off. His symptoms were concerning for unstable angina and he was hypertensive, so he was admitted for further evaluation and treatment.  His home antihypertensive medications were resumed with improvement in blood pressure. Cardiac enzymes were cycled and negative.  Echocardiogram revealed mod LVH, EF 60-65%, and mild LAE. He underwent cardiac cath on 12/19/11 revealing diffuse distal and branch vessel CAD, no severe disease in the major coronaries, LV mild global hypokinesis, EF 45%. He tolerated the procedure well without complications. It was felt his significant distal and branch vessel disease could be causing his angina. Imdur was initiated and recommendations were made for medical management and risk factor modification including better control of diabetes. He was able to ambulate without chest pain or shortness of breath.  Blood glucoses were noted to be in the 500s requiring increased insulin coverage. A1C was extremely elevated at 14.6. His blood glucose improved with increased insulin administration, but remained in the low 200s on day of discharge. He will need close follow up with his PCP for improved diabetes management.  He has history of Atrial Fibrillation on coumadin. He was in NSR during hospitalization and his digoxin was discontinued. INR was subtherapeutic on admission with concern for noncompliance with coumadin. He does not have insurance and has INR checked at [facility]. He would like to have it followed with [facility] so follow up was arranged with our coumadin clinic. INR was therapeutic at 2.16 upon discharge.  He was seen and evaluated by Dr. Shirlee Latch who felt he was stable for discharge home with plans for follow up as scheduled below.  Discharge Vitals: Blood pressure 111/68, pulse 77, temperature 98.2 F (36.8 C), temperature source Oral, resp. rate 18, height 6' (1.829 m), weight 197 lb 1.6 oz (89.404 kg), SpO2 95.00%.  Labs: Component Value Date   WBC 5.4 12/20/2011   HGB 14.0 12/20/2011   HCT 42.4 12/20/2011   MCV 82.3 12/20/2011   PLT 144* 12/20/2011     12/18/2011 11:20 12/19/2011 05:55 12/20/2011 05:25  Prothrombin Time 18.8 (H) 21.4 (H) 24.5 (H)  INR 1.54 (H) 1.82 (H) 2.16 (H)   Lab 12/20/11 0525 12/18/11 1120  NA 136 --  K 3.6 --    CL 103 --  CO2 24 --  BUN 17 --  CREATININE 0.93 --  CALCIUM 9.1 --  PROT -- 8.1  BILITOT -- 0.4  ALKPHOS -- 69  ALT -- 11  AST -- 11  GLUCOSE 219* --   Basename 12/19/11 0835 12/18/11 1120  CKTOTAL 102 131  CKMB 2.5 2.1  TROPONINI <0.30 <0.30   Component Value Date   CHOL 167 12/18/2011   HDL 57 12/18/2011   LDLCALC 85 12/18/2011   TRIG 124 12/18/2011     12/18/2011 11:27  Digoxin Level 0.4 (L)     12/18/2011 11:20  Hemoglobin A1C 14.6 (H)     12/18/2011 11:20  TSH 0.996    Discharge Medications   Medication List  As of 12/20/2011  9:58 AM   STOP taking these medications         digoxin 0.25 MG tablet         TAKE these medications         allopurinol 300 MG tablet   Commonly known as: ZYLOPRIM   Take 300 mg by mouth daily.      amLODipine 5 MG tablet  Commonly known as: NORVASC   Take 1 tablet (5 mg total) by mouth daily.      aspirin 81 MG EC tablet   Take 1 tablet (81 mg total) by mouth daily.      carvedilol 25 MG tablet   Commonly known as: COREG   Take 25 mg by mouth 2 (two) times daily with a meal.      COLCRYS 0.6 MG tablet   Generic drug: colchicine   Take 0.6 mg by mouth daily as needed. For gout flare up      furosemide 40 MG tablet   Commonly known as: LASIX   Take 1 tablet (40 mg total) by mouth daily.      insulin glargine 100 UNIT/ML injection   Commonly known as: LANTUS   Inject 40 Units into the skin at bedtime.      isosorbide mononitrate 60 MG 24 hr tablet   Commonly known as: IMDUR   Take 1 tablet (60 mg total) by mouth daily.      losartan 100 MG tablet   Commonly known as: COZAAR   Take 100 mg by mouth daily.      metFORMIN 500 MG tablet   Commonly known as: GLUCOPHAGE   Take 500 mg by mouth 2 (two) times daily with a meal.      nitroGLYCERIN 0.4 MG SL tablet   Commonly known as: NITROSTAT   Place 1 tablet (0.4 mg total) under the tongue every 5 (five) minutes x 3 doses as needed for chest pain (up to 3 doses).       potassium chloride SA 20 MEQ tablet   Commonly known as: K-DUR,KLOR-CON   Take 20 mEq by mouth daily at 12 noon.      rosuvastatin 20 MG tablet   Commonly known as: CRESTOR   Take 20 mg by mouth at bedtime.      warfarin 5 MG tablet   Commonly known as: COUMADIN   Take 0.5-1 tablets (2.5-5 mg total) by mouth daily. Alternating tablets every day (2.5mg  one day and 5mg  the next). Take 5mg  today 12/20/11            Disposition   Discharge Orders    Future Appointments: Provider: Department: Dept Phone: Center:   12/27/2011 8:45 AM [name], MD [facility] [phone] [id number]     Future Orders Please Complete By Expires   Diet - low sodium heart healthy      Increase activity slowly      Discharge instructions      Comments:   **PLEASE REMEMBER TO BRING ALL OF YOUR MEDICATIONS TO EACH OF YOUR FOLLOW-UP OFFICE VISITS.  * KEEP WRIST CATHETERIZATION SITE CLEAN AND DRY. Call the office for any signs of bleeding, pus, swelling, increased pain, or any other concerns. * NO HEAVY LIFTING (>10lbs) OR SEXUAL ACTIVITY X 7 DAYS. * NO DRIVING X 2-3 DAYS. * NO SOAKING BATHS, HOT TUBS, POOLS, ETC., X 7 DAYS.  * Please take 5mg  coumadin today and alternate 2.5mg  and 5mg  as you were before. Please follow up with the coumadin clinic on Monday 5/13  * Your blood sugars were very high during your hospitalizations and your HA1C was 14.6. You will need to follow up with your primary care provider as soon as possible to discuss further management of your diabetes.       Follow-up Information    Follow up with [facility] on 12/24/2011. (9:40)    Contact  information:   [facility] [address] [place] [address] [phone]      Follow up with Marca Ancona, MD on 12/27/2011. (8:45)    Contact information:   [facility] [address] [place] [address] [phone]       Schedule an  appointment as soon as possible for a visit with MARTIN,NYKEDTRA, NP. (As soon as possible)    Contact information:   [facility] [address] [facility] [place] [address] [phone]           Outstanding Labs/Studies:  INR  Duration of Discharge Encounter: Greater than 30 minutes including physician and PA time.  Signed, Manuelito Poage PA-C 12/20/2011, 9:58 AM

## 2011-12-20 NOTE — Progress Notes (Signed)
ANTICOAGULATION CONSULT NOTE - Follow Up Consult  Pharmacy Consult for coumadin Indication: atrial fibrillation  Not on File  Patient Measurements: Height: 6' (182.9 cm) Weight: 197 lb 1.6 oz (89.404 kg) IBW/kg (Calculated) : 77.6  Heparin Dosing Weight:   Vital Signs: Temp: 98.2 F (36.8 C) (05/09 0524) Temp src: Oral (05/09 0524) BP: 106/70 mmHg (05/09 0846) Pulse Rate: 77  (05/09 0846)  Labs:  Basename 12/20/11 0525 12/19/11 0835 12/19/11 0555 12/18/11 1120  HGB 14.0 -- -- 15.3  HCT 42.4 -- -- 43.3  PLT 144* -- -- 177  APTT -- -- -- 43*  LABPROT 24.5* -- 21.4* 18.8*  INR 2.16* -- 1.82* 1.54*  HEPARINUNFRC -- -- -- --  CREATININE 0.93 -- 0.95 0.98  CKTOTAL -- 102 -- 131  CKMB -- 2.5 -- 2.1  TROPONINI -- <0.30 -- <0.30    Estimated Creatinine Clearance: 96.2 ml/min (by C-G formula based on Cr of 0.93).   Medications:  Scheduled:     . allopurinol  300 mg Oral Daily  . amLODipine  5 mg Oral Daily  . aspirin  324 mg Oral Pre-Cath  . aspirin EC  81 mg Oral Daily  . atorvastatin  40 mg Oral q1800  . carvedilol  25 mg Oral BID WC  . fentaNYL      . furosemide  40 mg Oral Daily  . heparin      . heparin      . insulin aspart  0-5 Units Subcutaneous QHS  . insulin aspart  0-9 Units Subcutaneous TID WC  . insulin glargine  40 Units Subcutaneous QHS  . isosorbide mononitrate  60 mg Oral Daily  . lidocaine      . living well with diabetes book   Does not apply Once  . losartan  100 mg Oral Daily  . midazolam      . nitroGLYCERIN      . potassium chloride  20 mEq Oral Daily  . regadenoson  0.4 mg Intravenous Once  . sodium chloride  3 mL Intravenous Q12H  . warfarin  7.5 mg Oral ONCE-1800  . Warfarin - Pharmacist Dosing Inpatient   Does not apply q1800  . DISCONTD: furosemide  80 mg Oral Daily  . DISCONTD: heparin  5,000 Units Subcutaneous Q8H  . DISCONTD: sodium chloride  3 mL Intravenous Q12H    Assessment: 56 yo M with h/o afib on warfarin PTA - home  dose noted to be 5 mg alternating with 2.5 mg.  Questionable compliance, INR on admission 1.54.  Now s/p cath with plans for medical management and orders to resume warfarin dosing. INR today 2.16  Goal of Therapy:  INR 2-3 Monitor platelets by anticoagulation protocol: Yes   Plan:  1.  Coumadin 5 mg today. 2.  Continue daily INR  Talbert Cage, PharmD Clinical Pharmacist Pager: [phone] 12/20/2011 9:18 AM

## 2011-12-24 ENCOUNTER — Encounter: Payer: Self-pay | Admitting: Cardiology

## 2011-12-27 ENCOUNTER — Ambulatory Visit (INDEPENDENT_AMBULATORY_CARE_PROVIDER_SITE_OTHER): Payer: Self-pay | Admitting: Cardiology

## 2011-12-27 ENCOUNTER — Telehealth: Payer: Self-pay | Admitting: *Deleted

## 2011-12-27 ENCOUNTER — Encounter: Payer: Self-pay | Admitting: Cardiology

## 2011-12-27 DIAGNOSIS — E7800 Pure hypercholesterolemia, unspecified: Secondary | ICD-10-CM

## 2011-12-27 DIAGNOSIS — I10 Essential (primary) hypertension: Secondary | ICD-10-CM

## 2011-12-27 DIAGNOSIS — I4891 Unspecified atrial fibrillation: Secondary | ICD-10-CM

## 2011-12-27 DIAGNOSIS — R079 Chest pain, unspecified: Secondary | ICD-10-CM

## 2011-12-27 DIAGNOSIS — I2510 Atherosclerotic heart disease of native coronary artery without angina pectoris: Secondary | ICD-10-CM

## 2011-12-27 MED ORDER — RANOLAZINE ER 500 MG PO TB12: 500.0000 mg | ORAL_TABLET | Freq: Two times a day (BID) | ORAL | Status: DC

## 2011-12-27 MED ORDER — ISOSORBIDE MONONITRATE ER 30 MG PO TB24: 30.0000 mg | ORAL_TABLET | Freq: Every day | ORAL | Status: DC

## 2011-12-27 NOTE — Patient Instructions (Addendum)
Decrease Imdur (isosorbide)  to 30mg  daily. If you still have a headache after decreasing the dose you can stop taking it.   Start Ranexa (ranolazine) 500mg  twice a day. I will mail the application to you for patient assistance this afternoon after Dr Shirlee Latch signs the form. . Once you have completed your part of the application you can bring it back to me and I will fax it into the company. Be sure to bring your income documentation with the completed form. Luana Shu [phone].  You will need to have an EKG 2 weeks after you start Ranexa.  Your physician recommends that you schedule a follow-up appointment in: 1 month with Dr Shirlee Latch.

## 2011-12-27 NOTE — Telephone Encounter (Signed)
Mailed Ranexa Connect application to pt to complete.

## 2011-12-28 NOTE — Assessment & Plan Note (Signed)
Diffuse distal and branch vessel disease not amenable to intervention on recent cath.  This disease certainly could cause angina.  EF 45% on LV-gram.  He has had a headache with Imdur.  - Continue ASA 81 - Continue current Coreg and amlodipine for angina control.  - Decrease Imdur to 30 mg daily.  If headaches continue, stop.  - Add ranolazine 500 mg bid for angina control.  QT interval normal today.  ECG in 2 wks.  - Continue statin.

## 2011-12-28 NOTE — Assessment & Plan Note (Signed)
Paroxysmal atrial fibrillation, continue coumadin.  He is in NSR.   Followup in 1 month.

## 2011-12-28 NOTE — Assessment & Plan Note (Signed)
Goal LDL < 70.  Check lipids/LFTs in 2 months.  

## 2011-12-28 NOTE — Progress Notes (Signed)
PCP: Norberto Sorenson [facility])  57 yo with history of HTN, diabetes, paroxysmal atrial fibrillation, and CAD presents for cardiology followup after recent admission.  I had seen [name] [name] last a couple years ago when we were following him for nonischemic cardiomyopathy.  He was admitted in 5/13 with chest pain with exertion.  LHC was done showing global HK with EF 45% and diffuse, severe distal and branch vessel disease.  There was no interventional option, but I suspect that this disease could be causing his anginal-type pain.  Interestingly, echo was read as showing EF 60-65%.    Since getting home from the hospital, he has seen his PCP who is working on his poorly-controlled diabetes.  He has been having a severe headache with Imdur use.  Before coming to the hospital, he was having chest pain with mild exertion.  He continues to have chest pain episodes but they seem more atypical now.  He had chest pain at rest last night that was resolved with NTG sublingual.  No exertional dyspnea.   ECG: NSR, LVH with repolarization abnormality, QT is normal  Labs (5/13): K 3.6, creatinine 0.93, LDL 85, HDL 57  PMH: 1. DM2 2. Gout 3. HTN: Cough with ACEI.  4. Nonischemic CMP: EF 35% in 2008.  LHC in 2010 showed mild nonobstructive disease.  Echo in 5/13 showed EF 60-65% with moderate LVH but EF was 45% on LV-gram in 5/13.  5. CAD: LHC in 2010 with mild nonobstructive disease.  LHC (5/13) with diffuse distal and branch vessel disease, mild global hypokinesis and EF 45%.  6. H/o chronic HBV 7. Osteochondroma left shoulder.  8. Hyperlipidemia 9. Paroxysmal atrial fibrillation: Coumadin.  Developed cough and increased ESR with amiodarone.  10. Atrial flutter: had ablations in 2007 and 2008.   SH: Married, prior smoker.  Does use occasional marijuana.  Out of work Chiropractor.   FH: CAD  ROS: All systems reviewed and negative except as per HPI.   Current Outpatient Prescriptions  Medication Sig Dispense  Refill  . allopurinol (ZYLOPRIM) 300 MG tablet Take 300 mg by mouth daily.      [name] amLODipine (NORVASC) 5 MG tablet Take 1 tablet (5 mg total) by mouth daily.  30 tablet  6  . aspirin EC 81 MG EC tablet Take 1 tablet (81 mg total) by mouth daily.      . carvedilol (COREG) 25 MG tablet Take 25 mg by mouth 2 (two) times daily with a meal.      . colchicine (COLCRYS) 0.6 MG tablet Take 0.6 mg by mouth daily as needed. For gout flare up      . furosemide (LASIX) 40 MG tablet Take 1 tablet (40 mg total) by mouth daily.  30 tablet  6  . insulin glargine (LANTUS) 100 UNIT/ML injection Inject 40 Units into the skin at bedtime.        [name] losartan (COZAAR) 100 MG tablet Take 100 mg by mouth daily.      . metFORMIN (GLUCOPHAGE) 500 MG tablet Take 1,000 mg by mouth 2 (two) times daily with a meal.       . nitroGLYCERIN (NITROSTAT) 0.4 MG SL tablet Place 1 tablet (0.4 mg total) under the tongue every 5 (five) minutes x 3 doses as needed for chest pain (up to 3 doses).  25 tablet  3  . potassium chloride SA (K-DUR,KLOR-CON) 20 MEQ tablet Take 20 mEq by mouth 2 (two) times daily.       [name]  rosuvastatin (CRESTOR) 20 MG tablet Take 20 mg by mouth at bedtime.      [name] warfarin (COUMADIN) 5 MG tablet Take 0.5-1 tablets (2.5-5 mg total) by mouth daily. Alternating tablets every day (2.5mg  one day and 5mg  the next). Take 5mg  today 12/20/11  30 tablet  3  . isosorbide mononitrate (IMDUR) 30 MG 24 hr tablet Take 1 tablet (30 mg total) by mouth daily.  30 tablet  6  . ranolazine (RANEXA) 500 MG 12 hr tablet Take 1 tablet (500 mg total) by mouth 2 (two) times daily.  60 tablet  6    BP 120/78  Pulse 68  Ht 6' (1.829 m)  Wt 200 lb (90.719 kg)  BMI 27.12 kg/m2 General: NAD Neck: No JVD, no thyromegaly or thyroid nodule.  Lungs: Clear to auscultation bilaterally with normal respiratory effort. CV: Nondisplaced PMI.  Heart regular S1/S2, no S3/S4, no murmur.  No peripheral edema.  No carotid bruit.  Normal pedal pulses.    Abdomen: Soft, nontender, no hepatosplenomegaly, no distention.  Neurologic: Alert and oriented x 3.  Psych: Normal affect. Extremities: No clubbing or cyanosis.

## 2012-01-15 ENCOUNTER — Telehealth: Payer: Self-pay | Admitting: Cardiology

## 2012-01-15 NOTE — Telephone Encounter (Signed)
Walk in Pt Form " pt Dropped Off papers for Financial Assistance" sent to Katina Dung 01/15/12/KM

## 2012-02-11 ENCOUNTER — Encounter: Payer: Self-pay | Admitting: Cardiology

## 2012-02-11 ENCOUNTER — Ambulatory Visit (INDEPENDENT_AMBULATORY_CARE_PROVIDER_SITE_OTHER): Payer: Self-pay | Admitting: Cardiology

## 2012-02-11 VITALS — BP 130/80 | HR 72 | Ht 72.0 in | Wt 202.0 lb

## 2012-02-11 DIAGNOSIS — I4891 Unspecified atrial fibrillation: Secondary | ICD-10-CM

## 2012-02-11 DIAGNOSIS — I509 Heart failure, unspecified: Secondary | ICD-10-CM

## 2012-02-11 DIAGNOSIS — I2510 Atherosclerotic heart disease of native coronary artery without angina pectoris: Secondary | ICD-10-CM

## 2012-02-11 DIAGNOSIS — E7800 Pure hypercholesterolemia, unspecified: Secondary | ICD-10-CM

## 2012-02-11 DIAGNOSIS — R079 Chest pain, unspecified: Secondary | ICD-10-CM

## 2012-02-11 LAB — LIPID PANEL
Cholesterol: 123 mg/dL (ref 0–200)
Total CHOL/HDL Ratio: 2
Triglycerides: 75 mg/dL (ref 0.0–149.0)

## 2012-02-11 LAB — BASIC METABOLIC PANEL
BUN: 14 mg/dL (ref 6–23)
CO2: 28 mEq/L (ref 19–32)
Chloride: 105 mEq/L (ref 96–112)
Creatinine, Ser: 1.1 mg/dL (ref 0.4–1.5)

## 2012-02-11 NOTE — Assessment & Plan Note (Signed)
EF 45% on LV-gram, 60% on echo.  He does not appear volume overloaded today.  NYHA class II symptoms.  Decrease Lasix to 20 mg daily and decrease KCl to 10 mEq daily.

## 2012-02-11 NOTE — Assessment & Plan Note (Signed)
Paroxysmal atrial fibrillation, continue coumadin.  He is in NSR.

## 2012-02-11 NOTE — Patient Instructions (Addendum)
Decrease lasix (furosemide) to 20mg  daily.    Decrease KCL(potassium) to 10 mEq daily.  I spoke with Victorino Dike at [facility].  Your physician recommends that you have a FASTING lipid profile /BMET today.  Your physician wants you to follow-up in: 4 months with Dr Shirlee Latch. (November 2013). You will receive a reminder letter in the mail two months in advance. If you don't receive a letter, please call our office to schedule the follow-up appointment.

## 2012-02-11 NOTE — Progress Notes (Signed)
Patient ID: [name] [name], male   DOB: June 23, 1956, 56 y.o.   MRN: [id number] PCP: Norberto Sorenson [facility])  56 yo with history of HTN, diabetes, paroxysmal atrial fibrillation, and CAD presents for cardiology followup after recent admission.  He was admitted in 5/13 with chest pain with exertion.  LHC was done showing global HK with EF 45% and diffuse, severe distal and branch vessel disease.  There was no interventional option, but I suspect that this disease could be causing his anginal-type pain.  Interestingly, echo was read as showing EF 60-65%.    Initially after getting home from the hospital, he continued to have exertional chest pain.  I started him on ranolazine 500 mg bid, and lately he has had minimal chest pain.  He had been having headaches with Imdur so I had asked him to decrease the dose to 30 mg daily, but he is still taking 60 mg daily and not getting headaches any longer.  He has had chest tightness only with heavy exertion such as heavy yardwork.  He is not getting chest pain or dyspnea with his usual daily activities.  He feels like he is doing well.    ECG: NSR, LVH with repolarization abnormality, QTc is normal  Labs (5/13): K 3.6, creatinine 0.93, LDL 85, HDL 57  PMH: 1. DM2 2. Gout 3. HTN: Cough with ACEI.  4. Nonischemic CMP: EF 35% in 2008.  LHC in 2010 showed mild nonobstructive disease.  Echo in 5/13 showed EF 60-65% with moderate LVH but EF was 45% on LV-gram in 5/13.  5. CAD: LHC in 2010 with mild nonobstructive disease.  LHC (5/13) with diffuse distal and branch vessel disease, mild global hypokinesis and EF 45%.  6. H/o chronic HBV 7. Osteochondroma left shoulder.  8. Hyperlipidemia 9. Paroxysmal atrial fibrillation: Coumadin.  Developed cough and increased ESR with amiodarone.  10. Atrial flutter: had ablations in 2007 and 2008.   SH: Married, prior smoker.  Does use occasional marijuana.  Out of work Chiropractor.   FH: CAD  ROS: All systems reviewed and  negative except as per HPI.   Current Outpatient Prescriptions  Medication Sig Dispense Refill  . allopurinol (ZYLOPRIM) 300 MG tablet Take 300 mg by mouth daily.      [name] amLODipine (NORVASC) 5 MG tablet Take 1 tablet (5 mg total) by mouth daily.  30 tablet  6  . carvedilol (COREG) 25 MG tablet Take 25 mg by mouth 2 (two) times daily with a meal.      . colchicine (COLCRYS) 0.6 MG tablet Take 0.6 mg by mouth daily as needed. For gout flare up      . insulin aspart (NOVOLOG) 100 UNIT/ML injection Inject 5 Units into the skin 3 (three) times daily before meals.      . insulin glargine (LANTUS) 100 UNIT/ML injection Inject 40 Units into the skin at bedtime.        . isosorbide mononitrate (IMDUR) 30 MG 24 hr tablet Take 60 mg by mouth daily.      [name] losartan (COZAAR) 100 MG tablet Take 100 mg by mouth daily.      . metFORMIN (GLUCOPHAGE) 500 MG tablet Take 1,000 mg by mouth 2 (two) times daily with a meal.       . nitroGLYCERIN (NITROSTAT) 0.4 MG SL tablet Place 1 tablet (0.4 mg total) under the tongue every 5 (five) minutes x 3 doses as needed for chest pain (up to 3 doses).  25 tablet  3  .  potassium chloride SA (K-DUR,KLOR-CON) 20 MEQ tablet Take 10 mEq by mouth 2 (two) times daily.       . ranolazine (RANEXA) 500 MG 12 hr tablet Take 1 tablet (500 mg total) by mouth 2 (two) times daily.  60 tablet  6  . rosuvastatin (CRESTOR) 20 MG tablet Take 10 mg by mouth at bedtime.       [name] warfarin (COUMADIN) 5 MG tablet Take 0.5-1 tablets (2.5-5 mg total) by mouth daily. Alternating tablets every day (2.5mg  one day and 5mg  the next). Take 5mg  today 12/20/11  30 tablet  3  . DISCONTD: furosemide (LASIX) 40 MG tablet Take 1 tablet (40 mg total) by mouth daily.  30 tablet  6  . DISCONTD: isosorbide mononitrate (IMDUR) 30 MG 24 hr tablet Take 1 tablet (30 mg total) by mouth daily.  30 tablet  6  . furosemide (LASIX) 20 MG tablet Take 1 tablet (20 mg total) by mouth daily.      . potassium chloride (K-DUR) 10 MEQ  tablet Take 1 tablet (10 mEq total) by mouth daily.        BP 130/80  Pulse 72  Ht 6' (1.829 m)  Wt 91.627 kg (202 lb)  BMI 27.40 kg/m2 General: NAD Neck: No JVD, no thyromegaly or thyroid nodule.  Lungs: Clear to auscultation bilaterally with normal respiratory effort. CV: Nondisplaced PMI.  Heart regular S1/S2, no S3/S4, no murmur.  No peripheral edema.  No carotid bruit.  Normal pedal pulses.  Abdomen: Soft, nontender, no hepatosplenomegaly, no distention.  Neurologic: Alert and oriented x 3.  Psych: Normal affect. Extremities: No clubbing or cyanosis.

## 2012-02-11 NOTE — Assessment & Plan Note (Signed)
Goal LDL < 70.  Check lipids in 7/13.

## 2012-02-11 NOTE — Assessment & Plan Note (Addendum)
Diffuse distal and branch vessel disease not amenable to intervention on recent cath.  This disease certainly could cause angina.  EF 45% on LV-gram.  He is doing much better now on ranolazine with rare chest pain only with heavy exertion.  - He is on warfarin so does not have to take ASA given stable disease.  - Continue current Coreg, Imdur, ranolazine, and amlodipine for angina control.  QTc was normal on ranolazine today.  - Continue statin.

## 2012-04-04 ENCOUNTER — Telehealth: Payer: Self-pay | Admitting: Cardiology

## 2012-04-16 ENCOUNTER — Ambulatory Visit (INDEPENDENT_AMBULATORY_CARE_PROVIDER_SITE_OTHER): Payer: Medicaid Other | Admitting: *Deleted

## 2012-04-16 DIAGNOSIS — Z7901 Long term (current) use of anticoagulants: Secondary | ICD-10-CM

## 2012-04-16 DIAGNOSIS — I4891 Unspecified atrial fibrillation: Secondary | ICD-10-CM

## 2012-05-07 ENCOUNTER — Ambulatory Visit (INDEPENDENT_AMBULATORY_CARE_PROVIDER_SITE_OTHER): Payer: Medicaid Other | Admitting: *Deleted

## 2012-05-07 DIAGNOSIS — Z7901 Long term (current) use of anticoagulants: Secondary | ICD-10-CM

## 2012-05-07 DIAGNOSIS — I4891 Unspecified atrial fibrillation: Secondary | ICD-10-CM

## 2012-05-07 LAB — POCT INR: INR: 1.3

## 2012-05-07 MED ORDER — WARFARIN SODIUM 5 MG PO TABS: 5.0000 mg | ORAL_TABLET | ORAL | Status: DC

## 2012-05-16 ENCOUNTER — Ambulatory Visit (INDEPENDENT_AMBULATORY_CARE_PROVIDER_SITE_OTHER): Payer: Self-pay | Admitting: Pharmacist

## 2012-05-16 DIAGNOSIS — Z7901 Long term (current) use of anticoagulants: Secondary | ICD-10-CM

## 2012-05-16 DIAGNOSIS — I4891 Unspecified atrial fibrillation: Secondary | ICD-10-CM

## 2012-05-20 ENCOUNTER — Telehealth: Payer: Self-pay | Admitting: Pharmacist

## 2012-05-20 NOTE — Telephone Encounter (Signed)
Message copied by Velda Shell on Tue May 20, 2012  3:49 PM ------      Message from: [name]      Created: Sun May 18, 2012 10:32 PM       That would be fine.       ----- Message -----         From: Mariane Masters, PHARMD         Sent: 05/16/2012   1:02 PM           To: [name], MD            Pt was being followed by [facility] for PCP but has not been able to establish with anyone since they closed.  He is needing his allopurinol refilled and wants to know if you will do this.

## 2012-05-26 ENCOUNTER — Ambulatory Visit (INDEPENDENT_AMBULATORY_CARE_PROVIDER_SITE_OTHER): Payer: Medicaid Other | Admitting: *Deleted

## 2012-05-26 DIAGNOSIS — I4891 Unspecified atrial fibrillation: Secondary | ICD-10-CM

## 2012-05-26 DIAGNOSIS — Z7901 Long term (current) use of anticoagulants: Secondary | ICD-10-CM

## 2012-05-26 LAB — POCT INR: INR: 1.7

## 2012-05-28 NOTE — Telephone Encounter (Signed)
Attempted to reach patient on multiple occassions and left several messages.  Will wait until he calls back to verify allopurinol dose and what pharmacy to send refill to.

## 2012-06-16 ENCOUNTER — Ambulatory Visit (INDEPENDENT_AMBULATORY_CARE_PROVIDER_SITE_OTHER): Payer: Medicaid Other

## 2012-06-16 DIAGNOSIS — Z7901 Long term (current) use of anticoagulants: Secondary | ICD-10-CM

## 2012-06-16 DIAGNOSIS — I4891 Unspecified atrial fibrillation: Secondary | ICD-10-CM

## 2012-06-16 LAB — POCT INR: INR: 1.9

## 2012-06-16 MED ORDER — WARFARIN SODIUM 5 MG PO TABS: ORAL_TABLET | ORAL | Status: DC

## 2012-07-07 ENCOUNTER — Ambulatory Visit (INDEPENDENT_AMBULATORY_CARE_PROVIDER_SITE_OTHER): Payer: Medicaid Other

## 2012-07-07 DIAGNOSIS — Z7901 Long term (current) use of anticoagulants: Secondary | ICD-10-CM

## 2012-07-07 DIAGNOSIS — I4891 Unspecified atrial fibrillation: Secondary | ICD-10-CM

## 2012-07-07 LAB — POCT INR: INR: 1.2

## 2012-07-21 ENCOUNTER — Ambulatory Visit (INDEPENDENT_AMBULATORY_CARE_PROVIDER_SITE_OTHER): Payer: Medicaid Other

## 2012-07-21 DIAGNOSIS — I4891 Unspecified atrial fibrillation: Secondary | ICD-10-CM

## 2012-07-21 DIAGNOSIS — Z7901 Long term (current) use of anticoagulants: Secondary | ICD-10-CM

## 2012-07-28 ENCOUNTER — Emergency Department (INDEPENDENT_AMBULATORY_CARE_PROVIDER_SITE_OTHER)
Admission: EM | Admit: 2012-07-28 | Discharge: 2012-07-28 | Disposition: A | Discharge status: Home / Self Care | Payer: Medicaid Other | Source: Home / Self Care

## 2012-07-28 ENCOUNTER — Encounter (HOSPITAL_COMMUNITY): Payer: Self-pay

## 2012-07-28 DIAGNOSIS — E119 Type 2 diabetes mellitus without complications: Secondary | ICD-10-CM

## 2012-07-28 DIAGNOSIS — I10 Essential (primary) hypertension: Secondary | ICD-10-CM

## 2012-07-28 DIAGNOSIS — R079 Chest pain, unspecified: Secondary | ICD-10-CM

## 2012-07-28 DIAGNOSIS — M109 Gout, unspecified: Secondary | ICD-10-CM

## 2012-07-28 DIAGNOSIS — E7800 Pure hypercholesterolemia, unspecified: Secondary | ICD-10-CM

## 2012-07-28 DIAGNOSIS — I2510 Atherosclerotic heart disease of native coronary artery without angina pectoris: Secondary | ICD-10-CM

## 2012-07-28 DIAGNOSIS — I4891 Unspecified atrial fibrillation: Secondary | ICD-10-CM

## 2012-07-28 DIAGNOSIS — I509 Heart failure, unspecified: Secondary | ICD-10-CM

## 2012-07-28 MED ORDER — INFLUENZA VIRUS VACC SPLIT PF IM SUSP
0.5000 mL | Freq: Once | INTRAMUSCULAR | Status: AC
  Administered 2012-07-28: 0.5 mL via INTRAMUSCULAR

## 2012-07-28 MED ORDER — RANOLAZINE ER 500 MG PO TB12: 500.0000 mg | ORAL_TABLET | Freq: Two times a day (BID) | ORAL | Status: DC

## 2012-07-28 MED ORDER — LOSARTAN POTASSIUM 100 MG PO TABS: 100.0000 mg | ORAL_TABLET | Freq: Every day | ORAL | Status: DC

## 2012-07-28 MED ORDER — INSULIN GLARGINE 100 UNIT/ML ~~LOC~~ SOLN: 40.0000 [IU] | Freq: Every day | SUBCUTANEOUS | Status: DC

## 2012-07-28 MED ORDER — POTASSIUM CHLORIDE ER 10 MEQ PO TBCR: 10.0000 meq | EXTENDED_RELEASE_TABLET | Freq: Every day | ORAL | Status: DC

## 2012-07-28 MED ORDER — FUROSEMIDE 20 MG PO TABS: 20.0000 mg | ORAL_TABLET | Freq: Every day | ORAL | Status: DC

## 2012-07-28 MED ORDER — WARFARIN SODIUM 5 MG PO TABS: ORAL_TABLET | ORAL | Status: DC

## 2012-07-28 MED ORDER — ALLOPURINOL 300 MG PO TABS: 300.0000 mg | ORAL_TABLET | Freq: Every day | ORAL | Status: DC

## 2012-07-28 MED ORDER — INSULIN ASPART 100 UNIT/ML ~~LOC~~ SOLN: 5.0000 [IU] | Freq: Three times a day (TID) | SUBCUTANEOUS | Status: DC

## 2012-07-28 MED ORDER — ISOSORBIDE MONONITRATE ER 30 MG PO TB24: 60.0000 mg | ORAL_TABLET | Freq: Every day | ORAL | Status: DC

## 2012-07-28 MED ORDER — METFORMIN HCL 500 MG PO TABS: 1000.0000 mg | ORAL_TABLET | Freq: Two times a day (BID) | ORAL | Status: DC

## 2012-07-28 MED ORDER — ROSUVASTATIN CALCIUM 20 MG PO TABS: 10.0000 mg | ORAL_TABLET | Freq: Every day | ORAL | Status: DC

## 2012-07-28 MED ORDER — CARVEDILOL 25 MG PO TABS: 25.0000 mg | ORAL_TABLET | Freq: Two times a day (BID) | ORAL | Status: DC

## 2012-07-28 MED ORDER — AMLODIPINE BESYLATE 5 MG PO TABS: 5.0000 mg | ORAL_TABLET | Freq: Every day | ORAL | Status: DC

## 2012-07-28 NOTE — ED Notes (Signed)
Former health care client- needs medication refill

## 2012-07-28 NOTE — ED Provider Notes (Signed)
History     CSN: [id number]  Arrival date & time 07/28/12  1053   First MD Initiated Contact with Patient 07/28/12 1150      Chief Complaint  Patient presents with  . Medication Refill     HPI - Patient is a 56 year old African male with significant medical history of diabetes, hypertension, congestive heart failure, paroxysmal atrial fibrillation on Coumadin who presents here for medication refill. He currently has no complaints and is doing rather well. He denies any leg swelling or shortness of breath. He is currently being followed at the [facility] Coumadin clinic and also by Dr. Marca Ancona from the cardiology clinic. He has a history of diabetes and is on insulin.  Past Medical History  Diagnosis Date  . Hypertension   . Gout   . Atrial fibrillation     On coumadin  . DIABETES MELLITUS, TYPE II 03/06/2009  . HYPERTENSION, BENIGN ESSENTIAL 04/07/2009  . LIVER FUNCTION TESTS, ABNORMAL, HX OF 12/30/2006  . History of cardiac arrhythmia 12/18/2011    S/p cardioversion ?    . LV dysfunction 12/18/2011    EF 60-65% by echo 12/2011; 2 D echo (2008)Overall left ventricular systolic function was moderately to markedly decreased.Left ventricular ejection fraction was estimated , range being 25% to 35 %.There was severe diffuse left ventricular hypokinesis.     . Rectal bleeding 12/18/2011    Scheduled for colonoscopy.    [name] HEPATITIS B, CHRONIC 12/30/2006  . OSTEOCHONDROMA 12/30/2006     LEFT SHOULDER   . HYPERCHOLESTEROLEMIA 07/11/2010  . MITRAL REGURGITATION 12/30/2006  . COLONIC POLYPS, HX OF 12/30/2006  . Heart murmur   . CAD (coronary artery disease), native coronary artery     Nonobstructive CAD by cath 2013 - diffuse distal and branch vessel CAD, no severe disease in the major coronaries, LV mild global hypokinesis, EF 45%  . Congestive heart failure, unspecified   . H/O atrial flutter 2007    Past Surgical History  Procedure Date  . Osteochondroma excision 1972  . Coronary  angioplasty 12/05/01  . A flutter ablation 2007, 2008    catheter ablation     Family History  Problem Relation Age of Onset  . Diabetes Mother   . Hypertension Mother     History  Substance Use Topics  . Smoking status: Former Games developer  . Smokeless tobacco: Never Used  . Alcohol Use: Yes     Comment: occassionally      Review of Systems  Allergies  Other  Home Medications   Current Outpatient Rx  Name  Route  Sig  Dispense  Refill  . ALLOPURINOL 300 MG PO TABS   Oral   Take 1 tablet (300 mg total) by mouth daily.   30 tablet   3   . AMLODIPINE BESYLATE 5 MG PO TABS   Oral   Take 1 tablet (5 mg total) by mouth daily.   30 tablet   3   . CARVEDILOL 25 MG PO TABS   Oral   Take 1 tablet (25 mg total) by mouth 2 (two) times daily with a meal.   60 tablet   3   . COLCHICINE 0.6 MG PO TABS   Oral   Take 0.6 mg by mouth daily as needed. For gout flare up         . FUROSEMIDE 20 MG PO TABS   Oral   Take 1 tablet (20 mg total) by mouth daily.   30 tablet  3   . INSULIN ASPART 100 UNIT/ML [place] SOLN   Subcutaneous   Inject 5 Units into the skin 3 (three) times daily before meals.   1 vial   3   . INSULIN GLARGINE 100 UNIT/ML  SOLN   Subcutaneous   Inject 40 Units into the skin at bedtime.   10 mL   3   . ISOSORBIDE MONONITRATE ER 30 MG PO TB24   Oral   Take 2 tablets (60 mg total) by mouth daily.   30 tablet   3   . LOSARTAN POTASSIUM 100 MG PO TABS   Oral   Take 1 tablet (100 mg total) by mouth daily.   30 tablet   3   . METFORMIN HCL 500 MG PO TABS   Oral   Take 2 tablets (1,000 mg total) by mouth 2 (two) times daily with a meal.   60 tablet   3   . NITROGLYCERIN 0.4 MG SL SUBL   Sublingual   Place 1 tablet (0.4 mg total) under the tongue every 5 (five) minutes x 3 doses as needed for chest pain (up to 3 doses).   25 tablet   3   . POTASSIUM CHLORIDE ER 10 MEQ PO TBCR   Oral   Take 1 tablet (10 mEq total) by mouth daily.   30  tablet   3   . RANOLAZINE ER 500 MG PO TB12   Oral   Take 1 tablet (500 mg total) by mouth 2 (two) times daily.   60 tablet   3   . ROSUVASTATIN CALCIUM 20 MG PO TABS   Oral   Take 0.5 tablets (10 mg total) by mouth at bedtime.   30 tablet   3   . WARFARIN SODIUM 5 MG PO TABS      Take as directed by anticoagulation clinic   30 tablet   3     BP 155/83  Pulse 81  Temp 98.2 F (36.8 C) (Oral)  Resp 20  SpO2 100%  Physical Exam - HEENT atraumatic normocephalic -Chest bilaterally clear to auscultation Neck-no JVD Abdomen-soft nontender nondistended Legs-no edema Neurology- awake and alert no focal deficits.  ED Course  Procedures (including critical care time)  Labs Reviewed - No data to display No results found.   1. CHF (congestive heart failure)   2. Coronary artery disease   3. Atrial fibrillation   4. HYPERCHOLESTEROLEMIA   5. GOUT   6. HYPERTENSION, BENIGN ESSENTIAL   7. DIABETES MELLITUS, TYPE II   8. Chest pain, unspecified   9. Coronary atherosclerosis of native coronary artery    Atrial fibrillation - Rate controlled, being managed by cardiology and Coumadin is being managed by the Coumadin clinic. He has a followup appointment already with them on 12/30.  Known coronary artery disease - The managed by Dr. Ronelle Nigh of [facility] cardiology  Gout - Today with allopurinol  Hypertension - To do with his usual medications, he claims he was not taking Imdur as he ran out of it. Will recheck BP in one month and adjust medications accordingly  Diabetes - Continue with current dosing of Lantus and NovoLog  - He demonstrates compliance with followup and medications, we will check A1c during his next visit    dyslipidemia  - Continue with statins  - She demonstrates compliance with followup and medications will check lipid panel at next visit    health maintenance - Flu shot today - Wants to defer decision  about colonoscopy due next  visit.    MDM  Return to clinic in one month, if he is compliant with his medications and followup will need more help maintain his issues. He claims he was told he needs a colonoscopy, however does not want to undergo 1 currently in would like to talk about it more and his next visit. If he demonstrates compliance to medications and followup, we will check A1c and lipid panel as well and his next visit.        Maretta Bees, MD 07/28/12 [phone]

## 2012-08-03 NOTE — ED Notes (Signed)
Fax received from [facility] requesting prior approval for this patient for the Crestor Rx; this is a patient of the adult clinic, seen by Dr Dewayne Shorter, Levora Dredge , and this will be forwarded to their attention

## 2012-08-11 ENCOUNTER — Ambulatory Visit (INDEPENDENT_AMBULATORY_CARE_PROVIDER_SITE_OTHER): Payer: Medicaid Other | Admitting: *Deleted

## 2012-08-11 DIAGNOSIS — I4891 Unspecified atrial fibrillation: Secondary | ICD-10-CM

## 2012-08-11 DIAGNOSIS — Z7901 Long term (current) use of anticoagulants: Secondary | ICD-10-CM

## 2012-08-22 ENCOUNTER — Encounter: Payer: Self-pay | Admitting: Cardiology

## 2012-08-22 ENCOUNTER — Ambulatory Visit (INDEPENDENT_AMBULATORY_CARE_PROVIDER_SITE_OTHER): Payer: Medicaid Other | Admitting: Cardiology

## 2012-08-22 ENCOUNTER — Other Ambulatory Visit: Payer: Self-pay | Admitting: *Deleted

## 2012-08-22 ENCOUNTER — Ambulatory Visit (INDEPENDENT_AMBULATORY_CARE_PROVIDER_SITE_OTHER): Payer: Medicaid Other

## 2012-08-22 VITALS — BP 144/90 | HR 85 | Ht 72.0 in | Wt 219.0 lb

## 2012-08-22 DIAGNOSIS — I2510 Atherosclerotic heart disease of native coronary artery without angina pectoris: Secondary | ICD-10-CM

## 2012-08-22 DIAGNOSIS — I4891 Unspecified atrial fibrillation: Secondary | ICD-10-CM

## 2012-08-22 DIAGNOSIS — E7800 Pure hypercholesterolemia, unspecified: Secondary | ICD-10-CM

## 2012-08-22 DIAGNOSIS — I509 Heart failure, unspecified: Secondary | ICD-10-CM

## 2012-08-22 DIAGNOSIS — Z7901 Long term (current) use of anticoagulants: Secondary | ICD-10-CM

## 2012-08-22 DIAGNOSIS — R0602 Shortness of breath: Secondary | ICD-10-CM

## 2012-08-22 LAB — BASIC METABOLIC PANEL
BUN: 15 mg/dL (ref 6–23)
CO2: 27 mEq/L (ref 19–32)
Calcium: 9.3 mg/dL (ref 8.4–10.5)
Creatinine, Ser: 1.1 mg/dL (ref 0.4–1.5)

## 2012-08-22 LAB — POCT INR: INR: 2.2

## 2012-08-22 MED ORDER — LOSARTAN POTASSIUM 100 MG PO TABS: 100.0000 mg | ORAL_TABLET | Freq: Every day | ORAL | Status: DC

## 2012-08-22 MED ORDER — POTASSIUM CHLORIDE CRYS ER 20 MEQ PO TBCR: 20.0000 meq | EXTENDED_RELEASE_TABLET | Freq: Every day | ORAL | Status: DC

## 2012-08-22 MED ORDER — ROSUVASTATIN CALCIUM 10 MG PO TABS: 10.0000 mg | ORAL_TABLET | Freq: Every day | ORAL | Status: DC

## 2012-08-22 MED ORDER — FUROSEMIDE 40 MG PO TABS: 40.0000 mg | ORAL_TABLET | Freq: Every day | ORAL | Status: DC

## 2012-08-22 MED ORDER — NITROGLYCERIN 0.4 MG SL SUBL: 0.4000 mg | SUBLINGUAL_TABLET | SUBLINGUAL | Status: DC | PRN

## 2012-08-22 MED ORDER — WARFARIN SODIUM 5 MG PO TABS: ORAL_TABLET | ORAL | Status: DC

## 2012-08-22 MED ORDER — ISOSORBIDE MONONITRATE ER 30 MG PO TB24: 30.0000 mg | ORAL_TABLET | Freq: Every day | ORAL | Status: DC

## 2012-08-22 MED ORDER — AMLODIPINE BESYLATE 10 MG PO TABS: 10.0000 mg | ORAL_TABLET | Freq: Every day | ORAL | Status: DC

## 2012-08-22 MED ORDER — ROSUVASTATIN CALCIUM 20 MG PO TABS: 20.0000 mg | ORAL_TABLET | Freq: Every day | ORAL | Status: DC

## 2012-08-22 NOTE — Progress Notes (Signed)
Patient ID: [name] [name], male   DOB: [birth year], 57 y.o.   MRN: [id number] 57 yo with history of HTN, diabetes, paroxysmal atrial fibrillation, and CAD presents for cardiology followup.  He was admitted in 5/13 with chest pain with exertion.  LHC was done showing global HK with EF 45% and diffuse, severe distal and branch vessel disease.  There was no interventional option, but I suspect that this disease could be causing his anginal-type pain.  Interestingly, echo was read as showing EF 60-65%.  His exertional chest pain seemed to improve considerably with ranolazine.  He has not been very active recently and weight is up about 17 lbs.  He is short of breath after walking 1.5 blocks.  He is ok with a single flight of steps.  He is not having chest pain often, and when it does occur, it tends to be atypical (had some mild CP at rest yesterday).  He has been out of Crestor for a month.  BP continues to run a bit high.    ECG: NSR, LVH, QTc 464  Labs (5/13): K 3.6, creatinine 0.93, LDL 85, HDL 57 Labs (7/13): K 3.9, creatinine 1.1, LDL 57, HDL 51  PMH: 1. DM2 2. Gout 3. HTN: Cough with ACEI.  4. Nonischemic CMP: EF 35% in 2008.  LHC in 2010 showed mild nonobstructive disease.  Echo in 5/13 showed EF 60-65% with moderate LVH but EF was 45% on LV-gram in 5/13.  5. CAD: LHC in 2010 with mild nonobstructive disease.  LHC (5/13) with diffuse distal and branch vessel disease, mild global hypokinesis and EF 45%.  6. H/o chronic HBV 7. Osteochondroma left shoulder.  8. Hyperlipidemia 9. Paroxysmal atrial fibrillation: Coumadin.  Developed cough and increased ESR with amiodarone.  10. Atrial flutter: had ablations in 2007 and 2008.   SH: Married, prior smoker.  Does use occasional marijuana.  Out of work Chiropractor. 2 daughters attend [employer] in [place].   FH: CAD  ROS: All systems reviewed and negative except as per HPI.   Current Outpatient Prescriptions  Medication Sig Dispense Refill  .  allopurinol (ZYLOPRIM) 300 MG tablet Take 1 tablet (300 mg total) by mouth daily.  30 tablet  3  . carvedilol (COREG) 25 MG tablet Take 1 tablet (25 mg total) by mouth 2 (two) times daily with a meal.  60 tablet  3  . colchicine (COLCRYS) 0.6 MG tablet Take 0.6 mg by mouth daily as needed. For gout flare up      . insulin aspart (NOVOLOG) 100 UNIT/ML injection Inject 5 Units into the skin 3 (three) times daily before meals.  1 vial  3  . insulin glargine (LANTUS) 100 UNIT/ML injection Inject 40 Units into the skin at bedtime.  10 mL  3  . isosorbide mononitrate (IMDUR) 30 MG 24 hr tablet Take 1 tablet (30 mg total) by mouth daily.  30 tablet  6  . losartan (COZAAR) 100 MG tablet Take 1 tablet (100 mg total) by mouth daily.  30 tablet  6  . metFORMIN (GLUCOPHAGE) 500 MG tablet Take 2 tablets (1,000 mg total) by mouth 2 (two) times daily with a meal.  60 tablet  3  . nitroGLYCERIN (NITROSTAT) 0.4 MG SL tablet Place 1 tablet (0.4 mg total) under the tongue every 5 (five) minutes x 3 doses as needed for chest pain (up to 3 doses).  100 tablet  3  . potassium chloride (K-DUR) 10 MEQ tablet Take 1 tablet (10 mEq total)  by mouth daily.  30 tablet  3  . ranolazine (RANEXA) 500 MG 12 hr tablet Take 1 tablet (500 mg total) by mouth 2 (two) times daily.  60 tablet  3  . amLODipine (NORVASC) 10 MG tablet Take 1 tablet (10 mg total) by mouth daily.  30 tablet  6  . furosemide (LASIX) 40 MG tablet Take 1 tablet (40 mg total) by mouth daily.  30 tablet  6  . potassium chloride SA (K-DUR,KLOR-CON) 20 MEQ tablet Take 1 tablet (20 mEq total) by mouth daily.  30 tablet  6  . rosuvastatin (CRESTOR) 20 MG tablet Take 1 tablet (20 mg total) by mouth daily.  30 tablet  6  . warfarin (COUMADIN) 5 MG tablet Take as directed by anticoagulation clinic  30 tablet  3    BP 144/90  Pulse 85  Ht 6' (1.829 m)  Wt 219 lb (99.338 kg)  BMI 29.70 kg/m2 General: NAD Neck: JVP 8 cm, no thyromegaly or thyroid nodule.  Lungs:  Clear to auscultation bilaterally with normal respiratory effort. CV: Nondisplaced PMI.  Heart regular S1/S2, no S3/S4, no murmur.  No peripheral edema.  No carotid bruit.  Normal pedal pulses.  Abdomen: Soft, nontender, no hepatosplenomegaly, no distention.  Neurologic: Alert and oriented x 3.  Psych: Normal affect. Extremities: No clubbing or cyanosis.   Assessment/Plan:  Atrial fibrillation Paroxysmal atrial fibrillation, continue coumadin. He is in NSR.  Coronary artery disease Diffuse distal and branch vessel disease not amenable to intervention on cath in 5/13. This disease certainly could cause angina.  He is doing much better now on ranolazine with rare atypical chest pain at this point. - He is on warfarin so does not have to take ASA given stable disease.  - Continue current Coreg, Imdur, ranolazine, and amlodipine for angina control. QTc OK today.  - Continue statin.  HYPERCHOLESTEROLEMIA  Restart Crestor, check lipids in 2 months.  Chronic diastolic CHF  EF 14% on LV-gram, 60% on echo. He has gained about 17 lbs and is more short of breath with exertion, probably NYHA class III symptoms. I will increase lasix to 40 mg daily and KCl to 20 mEq daily.  Will get repeat echo in a month or so to confirm that EF is normal.  Hypertension BP still high.  Increase amlodipine to 10 mg daily.   Marca Ancona 08/22/2012 2:45 PM

## 2012-08-22 NOTE — Patient Instructions (Addendum)
Increase lasix(furosemide) to 40mg  daily.  Increase KCL(poatssium) to 20 mEq daily.  Increase amlodipine to 10mg  daily. You can take 2 of your 5mg  tablets and use your current supply.  Your physician recommends that you have lab work today--BMET/BNP   You have been referred to [facility] [phone] to establish with a doctor there for your primary care.  Your physician has requested that you have an echocardiogram. Echocardiography is a painless test that uses sound waves to create images of your heart. It provides your doctor with information about the size and shape of your heart and how well your heart's chambers and valves are working. This procedure takes approximately one hour. There are no restrictions for this procedure. In 1 MONTH  Your physician recommends that you return for a FASTING lipid profile /liver profile/BMET in 2 months.  Your physician wants you to follow-up in: 3 months with Dr Shirlee Latch. (April 2014).  You will receive a reminder letter in the mail two months in advance. If you don't receive a letter, please call our office to schedule the follow-up appointment.

## 2012-08-28 ENCOUNTER — Other Ambulatory Visit (HOSPITAL_COMMUNITY): Payer: Self-pay

## 2012-09-09 ENCOUNTER — Emergency Department (HOSPITAL_COMMUNITY)
Admission: EM | Admit: 2012-09-09 | Discharge: 2012-09-09 | Disposition: A | Discharge status: Home / Self Care | Payer: Medicaid Other | Source: Home / Self Care | Attending: Family Medicine | Admitting: Family Medicine

## 2012-09-09 ENCOUNTER — Encounter (HOSPITAL_COMMUNITY): Payer: Self-pay

## 2012-09-09 DIAGNOSIS — I2510 Atherosclerotic heart disease of native coronary artery without angina pectoris: Secondary | ICD-10-CM

## 2012-09-09 DIAGNOSIS — I4891 Unspecified atrial fibrillation: Secondary | ICD-10-CM

## 2012-09-09 DIAGNOSIS — K029 Dental caries, unspecified: Secondary | ICD-10-CM

## 2012-09-09 DIAGNOSIS — E7800 Pure hypercholesterolemia, unspecified: Secondary | ICD-10-CM

## 2012-09-09 DIAGNOSIS — I509 Heart failure, unspecified: Secondary | ICD-10-CM

## 2012-09-09 DIAGNOSIS — I10 Essential (primary) hypertension: Secondary | ICD-10-CM

## 2012-09-09 DIAGNOSIS — E119 Type 2 diabetes mellitus without complications: Secondary | ICD-10-CM

## 2012-09-09 LAB — COMPREHENSIVE METABOLIC PANEL
ALT: 13 U/L (ref 0–53)
AST: 14 U/L (ref 0–37)
Albumin: 3.8 g/dL (ref 3.5–5.2)
Alkaline Phosphatase: 59 U/L (ref 39–117)
Potassium: 3.6 mEq/L (ref 3.5–5.1)
Sodium: 137 mEq/L (ref 135–145)
Total Protein: 7.9 g/dL (ref 6.0–8.3)

## 2012-09-09 LAB — LIPID PANEL
HDL: 51 mg/dL (ref >39)
Total CHOL/HDL Ratio: 3.3 RATIO
VLDL: 37 mg/dL (ref 0–40)

## 2012-09-09 MED ORDER — INSULIN GLARGINE 100 UNIT/ML ~~LOC~~ SOLN: 40.0000 [IU] | Freq: Every day | SUBCUTANEOUS | Status: DC

## 2012-09-09 MED ORDER — INSULIN ASPART 100 UNIT/ML ~~LOC~~ SOLN: 10.0000 [IU] | Freq: Three times a day (TID) | SUBCUTANEOUS | Status: DC

## 2012-09-09 NOTE — ED Provider Notes (Signed)
History     CSN: [id number]  Arrival date & time 09/09/12  1505   First MD Initiated Contact with Patient 09/09/12 1517     Chief Complaint  Patient presents with  . Medication Refill   HPI Pt says that he is missing insulin doses.  He says that his BS is around 180-200.  The patient says that he is trying to take his medications regularly but frequently misses doses of both lantus and novolog.  He does not consistently eat 3 meals per day.  He says that he needs bloodwork per his cardiologist.    The patient has CAD and angina, complicated by congestive heart failure and atrial fibrillation.  He is currently on warfarin and is involved in a warfarin clinic.  The patient reports that he was not able to get his most recent Crestor prescription filled because of the need for prior authorization.  The patient is waiting on his cardiologist for prior authorization.  The patient denies hypoglycemia events.  The patient can recognize hypoglycemia.  The patient denies chest pain shortness of breath fever chills nausea vomiting and severe fatigue.   Past Medical History  Diagnosis Date  . Hypertension   . Gout   . Atrial fibrillation     On coumadin  . DIABETES MELLITUS, TYPE II 03/06/2009  . HYPERTENSION, BENIGN ESSENTIAL 04/07/2009  . LIVER FUNCTION TESTS, ABNORMAL, HX OF 12/30/2006  . History of cardiac arrhythmia 12/18/2011    S/p cardioversion ?    . LV dysfunction 12/18/2011    EF 60-65% by echo 12/2011; 2 D echo (2008)Overall left ventricular systolic function was moderately to markedly decreased.Left ventricular ejection fraction was estimated , range being 25% to 35 %.There was severe diffuse left ventricular hypokinesis.     . Rectal bleeding 12/18/2011    Scheduled for colonoscopy.    [name] HEPATITIS B, CHRONIC 12/30/2006  . OSTEOCHONDROMA 12/30/2006     LEFT SHOULDER   . HYPERCHOLESTEROLEMIA 07/11/2010  . MITRAL REGURGITATION 12/30/2006  . COLONIC POLYPS, HX OF 12/30/2006  . Heart murmur   .  CAD (coronary artery disease), native coronary artery     Nonobstructive CAD by cath 2013 - diffuse distal and branch vessel CAD, no severe disease in the major coronaries, LV mild global hypokinesis, EF 45%  . Congestive heart failure, unspecified   . H/O atrial flutter 2007    Past Surgical History  Procedure Date  . Osteochondroma excision 1972  . Coronary angioplasty 12/05/01  . A flutter ablation 2007, 2008    catheter ablation     Family History  Problem Relation Age of Onset  . Diabetes Mother   . Hypertension Mother     History  Substance Use Topics  . Smoking status: Former Games developer  . Smokeless tobacco: Never Used  . Alcohol Use: Yes     Comment: occassionally    Review of Systems  Constitutional: Positive for fatigue.  HENT: Positive for rhinorrhea.   Genitourinary: Positive for frequency.  All other systems reviewed and are negative.    Allergies  Other  Home Medications   Current Outpatient Rx  Name  Route  Sig  Dispense  Refill  . ALLOPURINOL 300 MG PO TABS   Oral   Take 1 tablet (300 mg total) by mouth daily.   30 tablet   3   . AMLODIPINE BESYLATE 10 MG PO TABS   Oral   Take 1 tablet (10 mg total) by mouth daily.   30 tablet  6   . COLCHICINE 0.6 MG PO TABS   Oral   Take 0.6 mg by mouth daily as needed. For gout flare up         . FUROSEMIDE 40 MG PO TABS   Oral   Take 1 tablet (40 mg total) by mouth daily.   30 tablet   6   . METFORMIN HCL 500 MG PO TABS   Oral   Take 2 tablets (1,000 mg total) by mouth 2 (two) times daily with a meal.   60 tablet   3   . POTASSIUM CHLORIDE ER 10 MEQ PO TBCR   Oral   Take 1 tablet (10 mEq total) by mouth daily.   30 tablet   3   . RANOLAZINE ER 500 MG PO TB12   Oral   Take 1 tablet (500 mg total) by mouth 2 (two) times daily.   60 tablet   3   . WARFARIN SODIUM 5 MG PO TABS      Take as directed by anticoagulation clinic   30 tablet   3   . CARVEDILOL 25 MG PO TABS   Oral    Take 1 tablet (25 mg total) by mouth 2 (two) times daily with a meal.   60 tablet   3   . INSULIN ASPART 100 UNIT/ML [place] SOLN   Subcutaneous   Inject 5 Units into the skin 3 (three) times daily before meals.   1 vial   3   . INSULIN GLARGINE 100 UNIT/ML [place] SOLN   Subcutaneous   Inject 40 Units into the skin at bedtime.   10 mL   3   . ISOSORBIDE MONONITRATE ER 30 MG PO TB24   Oral   Take 1 tablet (30 mg total) by mouth daily.   30 tablet   6   . LOSARTAN POTASSIUM 100 MG PO TABS   Oral   Take 1 tablet (100 mg total) by mouth daily.   30 tablet   6   . NITROGLYCERIN 0.4 MG SL SUBL   Sublingual   Place 1 tablet (0.4 mg total) under the tongue every 5 (five) minutes x 3 doses as needed for chest pain (up to 3 doses).   100 tablet   3   . POTASSIUM CHLORIDE CRYS ER 20 MEQ PO TBCR   Oral   Take 1 tablet (20 mEq total) by mouth daily.   30 tablet   6   . ROSUVASTATIN CALCIUM 20 MG PO TABS   Oral   Take 1 tablet (20 mg total) by mouth daily.   30 tablet   6    BP 155/78  Pulse 87  Temp 98 F (36.7 C) (Oral)  Resp 17  SpO2 100%  Physical Exam  Nursing note and vitals reviewed. Constitutional: He is oriented to person, place, and time. He appears well-developed and well-nourished. No distress.  HENT:  Head: Normocephalic and atraumatic.  Eyes: EOM are normal. Pupils are equal, round, and reactive to light.  Neck: Normal range of motion. Neck supple. No JVD present. No thyromegaly present.  Cardiovascular: Normal rate and normal heart sounds.   Pulmonary/Chest: Effort normal and breath sounds normal. No respiratory distress. He has no wheezes.  Abdominal: Soft. Bowel sounds are normal.  Musculoskeletal: Normal range of motion. He exhibits no edema and no tenderness.  Neurological: He is alert and oriented to person, place, and time.  Skin: Skin is warm and dry.  Psychiatric: He  has a normal mood and affect. His behavior is normal. Judgment and thought content  normal.    ED Course  Procedures (including critical care time)  Labs Reviewed - No data to display No results found.  No diagnosis found.  MDM  IMPRESSION  HTN  Diabetes Mellitus, type 2, insulin requiring  CAD  Hyperlipidemia  Atrial Fibrillation, anticoagulated with oral warfarin  RECOMMENDATIONS / PLAN Pt says that he is losing his medicaid benefits in a couple of days.  Then he will try to get an orange card.  Encouraged pt to please take his medications as prescribed.   Will likely increase his novolog insulin to 10 units tidAC depending on A1c Encouraged 3 meals per day.  Avoid skipping insulin doses, especially lantus. Check labs today Reviewed cardiology notes Diet information discussed Pt reports that he is uptodate on his flu vaccine as he had it for this season   FOLLOW UP 2 months   The patient was given clear instructions to go to ER or return to medical center if symptoms don't improve, worsen or new problems develop.  The patient verbalized understanding.  The patient was told to call to get lab results if they haven't heard anything in the next week.            Cleora Fleet, MD 09/09/12 [phone]

## 2012-09-09 NOTE — ED Notes (Signed)
Was seen at his cardiologist 08/22/2012 here to establish primary dr.  History of DM, CHG, gout hypertension

## 2012-09-10 ENCOUNTER — Telehealth (HOSPITAL_COMMUNITY): Payer: Self-pay

## 2012-09-10 LAB — HEMOGLOBIN A1C
Hgb A1c MFr Bld: 8.5 % — ABNORMAL HIGH (ref <5.7)
Mean Plasma Glucose: 197 mg/dL — ABNORMAL HIGH (ref <117)

## 2012-09-10 NOTE — Progress Notes (Signed)
Quick Note:  Please notify patient that his diabetes mellitus is uncontrolled as evidenced by hemoglobin A1c of 8.5%. The patient is at high risk for acute and chronic complications of poorly controlled diabetes mellitus. I recommend patient take his regular medications as prescribed and monitor his blood glucose closely. He should avoid concentrated sweets and fruit juices, white rice in large amounts and posterior and white bread should be avoided. In addition, recommend checking blood glucose writing down the numbers and calling our office in 2 weeks with his blood sugar numbers. His cholesterol and thyroid numbers came back okay.    Rodney Langton, MD, CDE, FAAFP [facility] [place], [place]   ______

## 2012-09-10 NOTE — Telephone Encounter (Signed)
Message copied by Lestine Mount on Wed Sep 10, 2012 12:39 PM ------      Message from: Cleora Fleet      Created: Wed Sep 10, 2012 11:00 AM       Please notify patient that his diabetes mellitus is uncontrolled as evidenced by hemoglobin A1c of 8.5%.  The patient is at high risk for acute and chronic complications of poorly controlled diabetes mellitus.  I recommend patient take his regular medications as prescribed and monitor his blood glucose closely.  He should avoid concentrated sweets and fruit juices, white rice in large amounts and posterior and white bread should be avoided.  In addition, recommend checking blood glucose writing down the numbers and calling our office in 2 weeks with his blood sugar numbers.  His cholesterol and thyroid numbers came back okay.                    Rodney Langton, MD, CDE, FAAFP      [facility]      [facility]      [place], [place]

## 2012-09-18 ENCOUNTER — Telehealth: Payer: Self-pay | Admitting: *Deleted

## 2012-09-18 MED ORDER — ATORVASTATIN CALCIUM 80 MG PO TABS: 80.0000 mg | ORAL_TABLET | Freq: Every day | ORAL | Status: DC

## 2012-09-18 NOTE — Telephone Encounter (Signed)
Crestor not covered by Medicaid per [facility] pharmacy. Per [facility] pharmacy--choose from zocor, lipitor, mevacor. Numerous unsuccessful attempts have been made to contact pt by telephone to discuss this with him.  Per Dr Darvin Neighbours will prescribe atorvastatin 80mg  instead of crestor.

## 2012-09-23 ENCOUNTER — Other Ambulatory Visit (HOSPITAL_COMMUNITY): Payer: Self-pay

## 2012-10-20 ENCOUNTER — Ambulatory Visit (INDEPENDENT_AMBULATORY_CARE_PROVIDER_SITE_OTHER): Payer: Medicaid Other | Admitting: *Deleted

## 2012-10-20 ENCOUNTER — Ambulatory Visit (INDEPENDENT_AMBULATORY_CARE_PROVIDER_SITE_OTHER): Payer: Medicaid Other

## 2012-10-20 DIAGNOSIS — I4891 Unspecified atrial fibrillation: Secondary | ICD-10-CM

## 2012-10-20 DIAGNOSIS — I509 Heart failure, unspecified: Secondary | ICD-10-CM

## 2012-10-20 DIAGNOSIS — Z7901 Long term (current) use of anticoagulants: Secondary | ICD-10-CM

## 2012-10-20 LAB — HEPATIC FUNCTION PANEL
ALT: 16 U/L (ref 0–53)
AST: 15 U/L (ref 0–37)
Bilirubin, Direct: 0.1 mg/dL (ref 0.0–0.3)
Total Bilirubin: 0.7 mg/dL (ref 0.3–1.2)

## 2012-10-20 LAB — BASIC METABOLIC PANEL
BUN: 11 mg/dL (ref 6–23)
CO2: 24 mEq/L (ref 19–32)
GFR: 103.26 mL/min (ref >60.00)
Glucose, Bld: 268 mg/dL — ABNORMAL HIGH (ref 70–99)
Potassium: 4 mEq/L (ref 3.5–5.1)

## 2012-10-20 LAB — LIPID PANEL: Total CHOL/HDL Ratio: 5

## 2012-10-20 MED ORDER — WARFARIN SODIUM 5 MG PO TABS: ORAL_TABLET | ORAL | Status: DC

## 2012-11-01 ENCOUNTER — Emergency Department (HOSPITAL_COMMUNITY): Payer: Self-pay

## 2012-11-01 ENCOUNTER — Emergency Department (HOSPITAL_COMMUNITY)
Admission: EM | Admit: 2012-11-01 | Discharge: 2012-11-01 | Disposition: A | Discharge status: Home / Self Care | Payer: Self-pay | Attending: Emergency Medicine | Admitting: Emergency Medicine

## 2012-11-01 ENCOUNTER — Encounter (HOSPITAL_COMMUNITY): Payer: Self-pay | Admitting: Nurse Practitioner

## 2012-11-01 DIAGNOSIS — M25561 Pain in right knee: Secondary | ICD-10-CM

## 2012-11-01 DIAGNOSIS — Z860100 Personal history of colon polyps, unspecified: Secondary | ICD-10-CM | POA: Insufficient documentation

## 2012-11-01 DIAGNOSIS — I4891 Unspecified atrial fibrillation: Secondary | ICD-10-CM | POA: Insufficient documentation

## 2012-11-01 DIAGNOSIS — M1711 Unilateral primary osteoarthritis, right knee: Secondary | ICD-10-CM

## 2012-11-01 DIAGNOSIS — R011 Cardiac murmur, unspecified: Secondary | ICD-10-CM | POA: Insufficient documentation

## 2012-11-01 DIAGNOSIS — E1169 Type 2 diabetes mellitus with other specified complication: Secondary | ICD-10-CM | POA: Insufficient documentation

## 2012-11-01 DIAGNOSIS — I10 Essential (primary) hypertension: Secondary | ICD-10-CM | POA: Insufficient documentation

## 2012-11-01 DIAGNOSIS — Z9889 Other specified postprocedural states: Secondary | ICD-10-CM | POA: Insufficient documentation

## 2012-11-01 DIAGNOSIS — Z87891 Personal history of nicotine dependence: Secondary | ICD-10-CM | POA: Insufficient documentation

## 2012-11-01 DIAGNOSIS — Z8719 Personal history of other diseases of the digestive system: Secondary | ICD-10-CM | POA: Insufficient documentation

## 2012-11-01 DIAGNOSIS — I509 Heart failure, unspecified: Secondary | ICD-10-CM | POA: Insufficient documentation

## 2012-11-01 DIAGNOSIS — Z794 Long term (current) use of insulin: Secondary | ICD-10-CM | POA: Insufficient documentation

## 2012-11-01 DIAGNOSIS — Z79899 Other long term (current) drug therapy: Secondary | ICD-10-CM | POA: Insufficient documentation

## 2012-11-01 DIAGNOSIS — Z8619 Personal history of other infectious and parasitic diseases: Secondary | ICD-10-CM | POA: Insufficient documentation

## 2012-11-01 DIAGNOSIS — I2510 Atherosclerotic heart disease of native coronary artery without angina pectoris: Secondary | ICD-10-CM | POA: Insufficient documentation

## 2012-11-01 DIAGNOSIS — M25569 Pain in unspecified knee: Secondary | ICD-10-CM | POA: Insufficient documentation

## 2012-11-01 DIAGNOSIS — IMO0002 Reserved for concepts with insufficient information to code with codable children: Secondary | ICD-10-CM | POA: Insufficient documentation

## 2012-11-01 DIAGNOSIS — Z8679 Personal history of other diseases of the circulatory system: Secondary | ICD-10-CM | POA: Insufficient documentation

## 2012-11-01 DIAGNOSIS — Z9861 Coronary angioplasty status: Secondary | ICD-10-CM | POA: Insufficient documentation

## 2012-11-01 DIAGNOSIS — M1710 Unilateral primary osteoarthritis, unspecified knee: Secondary | ICD-10-CM | POA: Insufficient documentation

## 2012-11-01 DIAGNOSIS — Z7901 Long term (current) use of anticoagulants: Secondary | ICD-10-CM | POA: Insufficient documentation

## 2012-11-01 LAB — GLUCOSE, CAPILLARY: Glucose-Capillary: 331 mg/dL — ABNORMAL HIGH (ref 70–99)

## 2012-11-01 MED ORDER — POTASSIUM CHLORIDE CRYS ER 20 MEQ PO TBCR: 20.0000 meq | EXTENDED_RELEASE_TABLET | Freq: Every day | ORAL | Status: DC

## 2012-11-01 MED ORDER — COLCHICINE 0.6 MG PO TABS: 0.6000 mg | ORAL_TABLET | Freq: Every day | ORAL | Status: DC

## 2012-11-01 MED ORDER — AMLODIPINE BESYLATE 10 MG PO TABS: 10.0000 mg | ORAL_TABLET | Freq: Every day | ORAL | Status: DC

## 2012-11-01 MED ORDER — FUROSEMIDE 40 MG PO TABS: 40.0000 mg | ORAL_TABLET | Freq: Every day | ORAL | Status: DC

## 2012-11-01 MED ORDER — RANOLAZINE ER 500 MG PO TB12: 500.0000 mg | ORAL_TABLET | Freq: Two times a day (BID) | ORAL | Status: DC

## 2012-11-01 MED ORDER — ALLOPURINOL 300 MG PO TABS: 300.0000 mg | ORAL_TABLET | Freq: Every day | ORAL | Status: DC

## 2012-11-01 MED ORDER — OXYCODONE HCL 5 MG PO CAPS: 5.0000 mg | ORAL_CAPSULE | Freq: Four times a day (QID) | ORAL | Status: DC | PRN

## 2012-11-01 MED ORDER — CARVEDILOL 25 MG PO TABS: 25.0000 mg | ORAL_TABLET | Freq: Two times a day (BID) | ORAL | Status: DC

## 2012-11-01 MED ORDER — ISOSORBIDE MONONITRATE ER 30 MG PO TB24: 30.0000 mg | ORAL_TABLET | Freq: Every day | ORAL | Status: DC

## 2012-11-01 MED ORDER — HYDROCODONE-ACETAMINOPHEN 5-325 MG PO TABS
2.0000 | ORAL_TABLET | Freq: Once | ORAL | Status: AC
  Administered 2012-11-01: 2 via ORAL
  Filled 2012-11-01: qty 2

## 2012-11-01 MED ORDER — NITROGLYCERIN 0.4 MG SL SUBL: 0.4000 mg | SUBLINGUAL_TABLET | SUBLINGUAL | Status: DC | PRN

## 2012-11-01 MED ORDER — METFORMIN HCL 1000 MG PO TABS: 500.0000 mg | ORAL_TABLET | Freq: Two times a day (BID) | ORAL | Status: DC

## 2012-11-01 MED ORDER — ATORVASTATIN CALCIUM 10 MG PO TABS: 80.0000 mg | ORAL_TABLET | Freq: Every day | ORAL | Status: DC

## 2012-11-01 MED ORDER — LOSARTAN POTASSIUM 100 MG PO TABS: 100.0000 mg | ORAL_TABLET | Freq: Every day | ORAL | Status: DC

## 2012-11-01 NOTE — ED Notes (Signed)
MD at bedside. 

## 2012-11-01 NOTE — ED Notes (Signed)
Pt instructed to change into hospital gown.

## 2012-11-01 NOTE — ED Notes (Signed)
Pt reports R knee pain since yesterday, no known injury. States he has gout but this "does not feel like gout." also reports elevated CBG at home all week, is out of metformin and didn't take his insulin last night.

## 2012-11-01 NOTE — ED Notes (Signed)
Pt denies symptoms of hyperglycemia. States his "breath feels short" but denies any other complaints besides knee pain.

## 2012-11-01 NOTE — ED Provider Notes (Signed)
History     CSN: [id number]  Arrival date & time 11/01/12  1045   None     Chief Complaint  Patient presents with  . Knee Pain  . Hyperglycemia    (Consider location/radiation/quality/duration/timing/severity/associated sxs/prior treatment) HPI Complains of right knee pain onset yesterday. Initially felt like "little tweaks" worse with walking or moving his knee improved with remaining still. Today pain has become constant. Pain is at the anteromedial portion of his right knee, nonradiating. Does not feel like gout in his experience in the past. He has had gout in his wrist and ankle, never in his knee. He denies fever denies trauma denies fever other complaint. Patient also reports that his blood sugar has been up for the past few days. He has run out of his metformin. He has taken his insulin today. Past Medical History  Diagnosis Date  . Hypertension   . Gout   . Atrial fibrillation     On coumadin  . DIABETES MELLITUS, TYPE II 03/06/2009  . HYPERTENSION, BENIGN ESSENTIAL 04/07/2009  . LIVER FUNCTION TESTS, ABNORMAL, HX OF 12/30/2006  . History of cardiac arrhythmia 12/18/2011    S/p cardioversion ?    . LV dysfunction 12/18/2011    EF 60-65% by echo 12/2011; 2 D echo (2008)Overall left ventricular systolic function was moderately to markedly decreased.Left ventricular ejection fraction was estimated , range being 25% to 35 %.There was severe diffuse left ventricular hypokinesis.     . Rectal bleeding 12/18/2011    Scheduled for colonoscopy.    [name] HEPATITIS B, CHRONIC 12/30/2006  . OSTEOCHONDROMA 12/30/2006     LEFT SHOULDER   . HYPERCHOLESTEROLEMIA 07/11/2010  . MITRAL REGURGITATION 12/30/2006  . COLONIC POLYPS, HX OF 12/30/2006  . Heart murmur   . CAD (coronary artery disease), native coronary artery     Nonobstructive CAD by cath 2013 - diffuse distal and branch vessel CAD, no severe disease in the major coronaries, LV mild global hypokinesis, EF 45%  . Congestive heart failure,  unspecified   . H/O atrial flutter 2007    Past Surgical History  Procedure Laterality Date  . Osteochondroma excision  1972  . Coronary angioplasty  12/05/01  . A flutter ablation  2007, 2008    catheter ablation     Family History  Problem Relation Age of Onset  . Diabetes Mother   . Hypertension Mother     History  Substance Use Topics  . Smoking status: Former Games developer  . Smokeless tobacco: Never Used  . Alcohol Use: Yes     Comment: occassionally      Review of Systems  Constitutional: Negative.   Respiratory: Negative.   Cardiovascular: Negative.   Musculoskeletal: Positive for arthralgias. Negative for joint swelling.    Allergies  Other  Home Medications   Current Outpatient Rx  Name  Route  Sig  Dispense  Refill  . allopurinol (ZYLOPRIM) 300 MG tablet   Oral   Take 1 tablet (300 mg total) by mouth daily.   30 tablet   3   . amLODipine (NORVASC) 10 MG tablet   Oral   Take 1 tablet (10 mg total) by mouth daily.   30 tablet   6   . atorvastatin (LIPITOR) 80 MG tablet   Oral   Take 1 tablet (80 mg total) by mouth daily.   30 tablet   3   . carvedilol (COREG) 25 MG tablet   Oral   Take 1 tablet (25 mg total)  by mouth 2 (two) times daily with a meal.   60 tablet   3   . colchicine (COLCRYS) 0.6 MG tablet   Oral   Take 0.6 mg by mouth daily as needed. For gout flare up         . furosemide (LASIX) 40 MG tablet   Oral   Take 1 tablet (40 mg total) by mouth daily.   30 tablet   6   . insulin aspart (NOVOLOG) 100 UNIT/ML injection   Subcutaneous   Inject 10 Units into the skin 3 (three) times daily before meals.   1 vial   3   . insulin glargine (LANTUS) 100 UNIT/ML injection   Subcutaneous   Inject 40 Units into the skin at bedtime.   10 mL   3   . isosorbide mononitrate (IMDUR) 30 MG 24 hr tablet   Oral   Take 1 tablet (30 mg total) by mouth daily.   30 tablet   6   . losartan (COZAAR) 100 MG tablet   Oral   Take 1 tablet  (100 mg total) by mouth daily.   30 tablet   6   . metFORMIN (GLUCOPHAGE) 500 MG tablet   Oral   Take 2 tablets (1,000 mg total) by mouth 2 (two) times daily with a meal.   60 tablet   3   . nitroGLYCERIN (NITROSTAT) 0.4 MG SL tablet   Sublingual   Place 1 tablet (0.4 mg total) under the tongue every 5 (five) minutes x 3 doses as needed for chest pain (up to 3 doses).   100 tablet   3   . potassium chloride (K-DUR) 10 MEQ tablet   Oral   Take 1 tablet (10 mEq total) by mouth daily.   30 tablet   3   . potassium chloride SA (K-DUR,KLOR-CON) 20 MEQ tablet   Oral   Take 1 tablet (20 mEq total) by mouth daily.   30 tablet   6   . ranolazine (RANEXA) 500 MG 12 hr tablet   Oral   Take 1 tablet (500 mg total) by mouth 2 (two) times daily.   60 tablet   3   . warfarin (COUMADIN) 5 MG tablet      Take as directed by anticoagulation clinic   30 tablet   3     BP 169/85  Pulse 93  Temp(Src) 98.7 F (37.1 C) (Oral)  Resp 18  SpO2 97%  Physical Exam  Nursing note and vitals reviewed. Constitutional: He appears well-developed and well-nourished.  HENT:  Head: Normocephalic and atraumatic.  Eyes: Conjunctivae are normal. Pupils are equal, round, and reactive to light.  Neck: Neck supple. No tracheal deviation present. No thyromegaly present.  Cardiovascular: Normal rate.   No murmur heard. Pulmonary/Chest: Effort normal. No respiratory distress.  Abdominal: He exhibits no distension.  Musculoskeletal: Normal range of motion. He exhibits tenderness. He exhibits no edema.  Right lower extremity. No redness no warmth no swelling DP pulse 2+ minimally tender antero-medial aspect of knee.  Neurological: He is alert. Coordination normal.  Walks with minimal limp favoring right lower extremity  Skin: Skin is warm and dry. No rash noted.  Psychiatric: He has a normal mood and affect.    ED Course  Procedures (including critical care time)  Labs Reviewed  GLUCOSE,  CAPILLARY - Abnormal; Notable for the following:    Glucose-Capillary 331 (*)    All other components within normal limits   Results  for orders placed during the hospital encounter of 11/01/12  GLUCOSE, CAPILLARY      Result Value Range   Glucose-Capillary 331 (*) 70 - 99 mg/dL   Dg Knee Complete 4 Views Right  11/01/2012  *RADIOLOGY REPORT*  Clinical Data: Knee pain, hyperglycemia  RIGHT KNEE - COMPLETE 4+ VIEW  Comparison: None.  Findings: Five views of the right knee submitted.  No acute fracture or subluxation.  Mild narrowing of medial joint compartment.  Mild spurring of the medial tibial plateau.  No joint effusion.  Narrowing of patellofemoral joint space.  IMPRESSION: No acute fracture or subluxation.  Mild degenerative changes.   Original Report Authenticated By: Natasha Mead, M.D.     No results found. Knee x-ray reviewed by me No diagnosis found.  Pain improved after treatment with Norco.  MDM   After pharmacist interviewed patient he has run all of all his medications except for warfarin insulin. I have written for a one-month supply of all medications except for insulin and warfarin. Also written for OxyIR. For knee pain. Patient's hyperglycemia likely secondary to medication noncompliance Plan followup [facility] Diagnosis #1 right knee pain #2 hyperglycemia #3 medication noncompliance        Doug Sou, MD 11/01/12 1430

## 2012-11-06 ENCOUNTER — Telehealth (HOSPITAL_COMMUNITY): Payer: Self-pay | Admitting: Emergency Medicine

## 2012-11-12 ENCOUNTER — Ambulatory Visit (INDEPENDENT_AMBULATORY_CARE_PROVIDER_SITE_OTHER): Payer: Self-pay | Admitting: *Deleted

## 2012-11-12 ENCOUNTER — Telehealth: Payer: Self-pay | Admitting: *Deleted

## 2012-11-12 ENCOUNTER — Other Ambulatory Visit: Payer: Self-pay | Admitting: *Deleted

## 2012-11-12 DIAGNOSIS — I4891 Unspecified atrial fibrillation: Secondary | ICD-10-CM

## 2012-11-12 DIAGNOSIS — E7800 Pure hypercholesterolemia, unspecified: Secondary | ICD-10-CM

## 2012-11-12 DIAGNOSIS — Z7901 Long term (current) use of anticoagulants: Secondary | ICD-10-CM

## 2012-11-12 DIAGNOSIS — I2510 Atherosclerotic heart disease of native coronary artery without angina pectoris: Secondary | ICD-10-CM

## 2012-11-12 LAB — POCT INR: INR: 2.2

## 2012-11-12 NOTE — Telephone Encounter (Signed)
Advised patient of lab results, was here for Coumadin check. Patient states he has not been taking any cholesterol medications. He will start Atorvastatin 80 mg daily and recheck labs in 2 months

## 2012-11-12 NOTE — Telephone Encounter (Signed)
Message copied by Burnell Blanks on Wed Nov 12, 2012 11:01 AM ------      Message from: [name]      Created: Sun Oct 26, 2012  8:28 PM       LDL is too high.  Is he taking Crestor 20? If so, increase to 40 daily with lipids/LFTs in 2 months ------

## 2012-12-01 ENCOUNTER — Encounter (HOSPITAL_COMMUNITY): Payer: Self-pay | Admitting: *Deleted

## 2012-12-01 ENCOUNTER — Emergency Department (INDEPENDENT_AMBULATORY_CARE_PROVIDER_SITE_OTHER)
Admission: EM | Admit: 2012-12-01 | Discharge: 2012-12-01 | Disposition: A | Discharge status: Home / Self Care | Payer: Self-pay | Source: Home / Self Care

## 2012-12-01 ENCOUNTER — Emergency Department (HOSPITAL_COMMUNITY)
Admission: EM | Admit: 2012-12-01 | Discharge: 2012-12-01 | Disposition: A | Discharge status: Home / Self Care | Payer: Self-pay | Source: Home / Self Care

## 2012-12-01 DIAGNOSIS — E119 Type 2 diabetes mellitus without complications: Secondary | ICD-10-CM

## 2012-12-01 DIAGNOSIS — I2510 Atherosclerotic heart disease of native coronary artery without angina pectoris: Secondary | ICD-10-CM

## 2012-12-01 DIAGNOSIS — I5022 Chronic systolic (congestive) heart failure: Secondary | ICD-10-CM

## 2012-12-01 DIAGNOSIS — I509 Heart failure, unspecified: Secondary | ICD-10-CM

## 2012-12-01 LAB — HEMOGLOBIN A1C: Mean Plasma Glucose: 272 mg/dL — ABNORMAL HIGH (ref <117)

## 2012-12-01 NOTE — ED Notes (Signed)
Patient presents for diabetic check and follow up and needs refill for novolog pen needles.

## 2012-12-01 NOTE — ED Notes (Signed)
Patient was not seen for visit. Patient instructed by provider to come back this afternoon for bloodwork and to bring in his insulin pens, and then return for F/U tomorrow morning at 1100 for appointment.

## 2012-12-02 ENCOUNTER — Emergency Department (INDEPENDENT_AMBULATORY_CARE_PROVIDER_SITE_OTHER)
Admission: EM | Admit: 2012-12-02 | Discharge: 2012-12-02 | Disposition: A | Discharge status: Home Health | Payer: Self-pay | Source: Home / Self Care

## 2012-12-02 ENCOUNTER — Encounter (HOSPITAL_COMMUNITY): Payer: Self-pay | Admitting: *Deleted

## 2012-12-02 DIAGNOSIS — I4891 Unspecified atrial fibrillation: Secondary | ICD-10-CM

## 2012-12-02 DIAGNOSIS — I10 Essential (primary) hypertension: Secondary | ICD-10-CM

## 2012-12-02 DIAGNOSIS — E119 Type 2 diabetes mellitus without complications: Secondary | ICD-10-CM

## 2012-12-02 DIAGNOSIS — I509 Heart failure, unspecified: Secondary | ICD-10-CM

## 2012-12-02 DIAGNOSIS — I5022 Chronic systolic (congestive) heart failure: Secondary | ICD-10-CM

## 2012-12-02 DIAGNOSIS — I2510 Atherosclerotic heart disease of native coronary artery without angina pectoris: Secondary | ICD-10-CM

## 2012-12-02 MED ORDER — LOSARTAN POTASSIUM 100 MG PO TABS: 100.0000 mg | ORAL_TABLET | Freq: Every day | ORAL | Status: DC

## 2012-12-02 MED ORDER — INSULIN ASPART 100 UNIT/ML ~~LOC~~ SOLN: 13.0000 [IU] | Freq: Three times a day (TID) | SUBCUTANEOUS | Status: DC

## 2012-12-02 MED ORDER — INSULIN PEN NEEDLE 30G X 8 MM MISC: 1.0000 | Status: DC | PRN

## 2012-12-02 MED ORDER — METFORMIN HCL 1000 MG PO TABS: 1000.0000 mg | ORAL_TABLET | Freq: Two times a day (BID) | ORAL | Status: DC

## 2012-12-02 MED ORDER — ISOSORBIDE MONONITRATE ER 30 MG PO TB24: 30.0000 mg | ORAL_TABLET | Freq: Every day | ORAL | Status: DC

## 2012-12-02 MED ORDER — CARVEDILOL 25 MG PO TABS: 25.0000 mg | ORAL_TABLET | Freq: Two times a day (BID) | ORAL | Status: DC

## 2012-12-02 MED ORDER — ATORVASTATIN CALCIUM 80 MG PO TABS: 80.0000 mg | ORAL_TABLET | Freq: Every day | ORAL | Status: DC

## 2012-12-02 MED ORDER — INSULIN GLARGINE 100 UNIT/ML ~~LOC~~ SOLN: 55.0000 [IU] | Freq: Every day | SUBCUTANEOUS | Status: DC

## 2012-12-02 MED ORDER — FUROSEMIDE 40 MG PO TABS: 40.0000 mg | ORAL_TABLET | Freq: Every day | ORAL | Status: DC

## 2012-12-02 NOTE — ED Provider Notes (Signed)
History     CSN: [id number]  Arrival date & time 12/02/12  1049   First MD Initiated Contact with Patient 12/02/12 1120      Chief Complaint Followup for DM   HPI A 57 year old male wit history off hypertension, atrial fibrillation on Coumadin (follows a [facility] cardiology for his INR checks), type 2 diabetes mellitus which is uncontrolled , chronic hepb B, CAD s/p cardiac cath in 2013, cardiomyopathy with EF of 35% presents for followup of his uncontrolled diabetes. He was last seen here a few months back and noted for hemoglobin A1c of 8.5. Patient is on Lantus of 40 units qhs, pre-meal NovoLog 10 units 3 times a day and metformin 500 mg twice a day however has been noncompliant with his pre-meal insulin and bedtime lantus and missing several doses. He does complain of polyuria and polydipsia but denies any 80, bloody vision, dizziness, chest pain, palpitation, shortness of breath, abdominal pain, nausea, vomiting, leg swelling this, tingling or numbness of his extremities. informs  being compliant with his other medications. informs checking his blood sugar every other day and ranges from 350 and sometimes close to 500.  Past Medical History  Diagnosis Date  . Hypertension   . Gout   . Atrial fibrillation     On coumadin  . DIABETES MELLITUS, TYPE II 03/06/2009  . HYPERTENSION, BENIGN ESSENTIAL 04/07/2009  . LIVER FUNCTION TESTS, ABNORMAL, HX OF 12/30/2006  . History of cardiac arrhythmia 12/18/2011    S/p cardioversion ?    . LV dysfunction 12/18/2011    EF 60-65% by echo 12/2011; 2 D echo (2008)Overall left ventricular systolic function was moderately to markedly decreased.Left ventricular ejection fraction was estimated , range being 25% to 35 %.There was severe diffuse left ventricular hypokinesis.     . Rectal bleeding 12/18/2011    Scheduled for colonoscopy.    [name] HEPATITIS B, CHRONIC 12/30/2006  . OSTEOCHONDROMA 12/30/2006     LEFT SHOULDER   . HYPERCHOLESTEROLEMIA 07/11/2010  . MITRAL  REGURGITATION 12/30/2006  . COLONIC POLYPS, HX OF 12/30/2006  . Heart murmur   . CAD (coronary artery disease), native coronary artery     Nonobstructive CAD by cath 2013 - diffuse distal and branch vessel CAD, no severe disease in the major coronaries, LV mild global hypokinesis, EF 45%  . Congestive heart failure, unspecified   . H/O atrial flutter 2007    Past Surgical History  Procedure Laterality Date  . Osteochondroma excision  1972  . Coronary angioplasty  12/05/01  . A flutter ablation  2007, 2008    catheter ablation     Family History  Problem Relation Age of Onset  . Diabetes Mother   . Hypertension Mother     History  Substance Use Topics  . Smoking status: Former Games developer  . Smokeless tobacco: Never Used  . Alcohol Use: Yes     Comment: occassionally      Review of Systems As outlined in history of present illness   Allergies  Other  Home Medications   Current Outpatient Rx  Name  Route  Sig  Dispense  Refill  . allopurinol (ZYLOPRIM) 300 MG tablet   Oral   Take 1 tablet (300 mg total) by mouth daily.   30 tablet   3   . amLODipine (NORVASC) 10 MG tablet   Oral   Take 1 tablet (10 mg total) by mouth daily.   30 tablet   0   . atorvastatin (LIPITOR) 80 MG  tablet   Oral   Take 1 tablet (80 mg total) by mouth daily.   30 tablet   3   . carvedilol (COREG) 25 MG tablet   Oral   Take 1 tablet (25 mg total) by mouth 2 (two) times daily with a meal.   60 tablet   0   . colchicine (COLCRYS) 0.6 MG tablet   Oral   Take 0.6 mg by mouth daily as needed. For gout flare up         . furosemide (LASIX) 40 MG tablet   Oral   Take 1 tablet (40 mg total) by mouth daily.   30 tablet   0   . insulin aspart (NOVOLOG) 100 UNIT/ML injection   Subcutaneous   Inject 13 Units into the skin 3 (three) times daily before meals.   1 vial   3   . insulin glargine (LANTUS) 100 UNIT/ML injection   Subcutaneous   Inject 0.55 mLs (55 Units total) into the  skin at bedtime.   10 mL   5   . isosorbide mononitrate (IMDUR) 30 MG 24 hr tablet   Oral   Take 1 tablet (30 mg total) by mouth daily.   30 tablet   3   . losartan (COZAAR) 100 MG tablet   Oral   Take 1 tablet (100 mg total) by mouth daily.   30 tablet   3   . metFORMIN (GLUCOPHAGE) 1000 MG tablet   Oral   Take 1 tablet (1,000 mg total) by mouth 2 (two) times daily.   60 tablet   3   . nitroGLYCERIN (NITROSTAT) 0.4 MG SL tablet   Sublingual   Place 1 tablet (0.4 mg total) under the tongue every 5 (five) minutes x 3 doses as needed for chest pain (up to 3 doses).   100 tablet   3   . oxycodone (OXY-IR) 5 MG capsule   Oral   Take 1 capsule (5 mg total) by mouth every 6 (six) hours as needed for pain.   10 capsule   0   . potassium chloride SA (K-DUR,KLOR-CON) 20 MEQ tablet   Oral   Take 1 tablet (20 mEq total) by mouth daily.   30 tablet   0   . ranolazine (RANEXA) 500 MG 12 hr tablet   Oral   Take 1 tablet (500 mg total) by mouth 2 (two) times daily.   60 tablet   0   . warfarin (COUMADIN) 5 MG tablet   Oral   Take 2.5-5 mg by mouth daily. Take 2.5mg  on Tuesday and Thursday. Take 5mg  the rest of the week           BP 141/91  Pulse 77  Temp(Src) 98.1 F (36.7 C) (Oral)  Resp 18  SpO2 97%  Physical Exam Visit 2 distress HEENT: No pallor, most total mucosa Chest: Clear to auscultation bilaterally, bladder sounds CVS: Normal S1-S2, no murmurs rub or gallop Abdomen: Soft, nontender, nondistended, bowel sounds present Extremities: Warm, no edema, normal peripheral sensations CNS: AAO x3 ED Course  Procedures (including critical care time)  Labs Reviewed - No data to display No results found.   1. Chronic systolic CHF (congestive heart failure)   2. HYPERTENSION, BENIGN ESSENTIAL   3. DIABETES MELLITUS, TYPE II   4. Atrial fibrillation     Uncontrolled diabetes mellitus The patient's hemoglobin A1c was 8.5 few months back and he is 9  checked yesterday. He reports being noncompliant  with his insulin . Also seems noncompliant with his diet. I have counseled him strongly on the activity adherence and compliance to medications. I have instructed him to keep a log of his fsg every meal and bring it during his next visit in 2 weeks. I will increase his Lantus to 55 units at bedtime and increase his pre-meal aspart 13 units 3 times a day. I will also increase his metformin 2000 mg twice a day.  Chronic CHF Appears euvolemic. Continue coreg, Lasix and losartan  CAD Currently stable. follows with cardiology. Recent lipid panel stable  Atrial  fibrillation 3 controlled. On chronic Coumadin with INR checked at [facility] cardiology office  MDM  follow up with me for fsg monitoring in 2 weeks.         Eddie North, MD 12/02/12 1218

## 2012-12-02 NOTE — ED Notes (Signed)
Follow up with blood work

## 2012-12-15 ENCOUNTER — Encounter (HOSPITAL_COMMUNITY): Payer: Self-pay | Admitting: Cardiology

## 2012-12-15 ENCOUNTER — Emergency Department (HOSPITAL_COMMUNITY): Payer: Self-pay

## 2012-12-15 ENCOUNTER — Observation Stay (HOSPITAL_COMMUNITY)
Admission: EM | Admit: 2012-12-15 | Discharge: 2012-12-16 | Disposition: A | Discharge status: Home / Self Care | Payer: Self-pay | Attending: Cardiology | Admitting: Cardiology

## 2012-12-15 DIAGNOSIS — I5022 Chronic systolic (congestive) heart failure: Secondary | ICD-10-CM | POA: Insufficient documentation

## 2012-12-15 DIAGNOSIS — E119 Type 2 diabetes mellitus without complications: Secondary | ICD-10-CM | POA: Insufficient documentation

## 2012-12-15 DIAGNOSIS — R0602 Shortness of breath: Secondary | ICD-10-CM | POA: Insufficient documentation

## 2012-12-15 DIAGNOSIS — I160 Hypertensive urgency: Secondary | ICD-10-CM

## 2012-12-15 DIAGNOSIS — I4891 Unspecified atrial fibrillation: Secondary | ICD-10-CM | POA: Insufficient documentation

## 2012-12-15 DIAGNOSIS — R079 Chest pain, unspecified: Secondary | ICD-10-CM | POA: Insufficient documentation

## 2012-12-15 DIAGNOSIS — I200 Unstable angina: Principal | ICD-10-CM | POA: Insufficient documentation

## 2012-12-15 DIAGNOSIS — I509 Heart failure, unspecified: Secondary | ICD-10-CM | POA: Insufficient documentation

## 2012-12-15 LAB — CBC
Hemoglobin: 15.8 g/dL (ref 13.0–17.0)
MCHC: 36.3 g/dL — ABNORMAL HIGH (ref 30.0–36.0)
RBC: 5.62 MIL/uL (ref 4.22–5.81)
WBC: 5.3 10*3/uL (ref 4.0–10.5)

## 2012-12-15 LAB — GLUCOSE, CAPILLARY: Glucose-Capillary: 274 mg/dL — ABNORMAL HIGH (ref 70–99)

## 2012-12-15 LAB — BASIC METABOLIC PANEL
Chloride: 96 mEq/L (ref 96–112)
GFR calc non Af Amer: 76 mL/min — ABNORMAL LOW (ref >90)
Glucose, Bld: 431 mg/dL — ABNORMAL HIGH (ref 70–99)
Potassium: 4 mEq/L (ref 3.5–5.1)
Sodium: 132 mEq/L — ABNORMAL LOW (ref 135–145)

## 2012-12-15 LAB — PROTIME-INR
INR: 1.75 — ABNORMAL HIGH (ref 0.00–1.49)
Prothrombin Time: 19.8 seconds — ABNORMAL HIGH (ref 11.6–15.2)

## 2012-12-15 LAB — TROPONIN I: Troponin I: <0.3 ng/mL (ref <0.30)

## 2012-12-15 MED ORDER — CARVEDILOL 25 MG PO TABS
25.0000 mg | ORAL_TABLET | Freq: Two times a day (BID) | ORAL | Status: DC
  Administered 2012-12-15 – 2012-12-16 (×3): 25 mg via ORAL
  Filled 2012-12-15 (×4): qty 1

## 2012-12-15 MED ORDER — RANOLAZINE ER 500 MG PO TB12
500.0000 mg | ORAL_TABLET | Freq: Two times a day (BID) | ORAL | Status: DC
  Administered 2012-12-15 – 2012-12-16 (×2): 500 mg via ORAL
  Filled 2012-12-15 (×3): qty 1

## 2012-12-15 MED ORDER — WARFARIN SODIUM 7.5 MG PO TABS
7.5000 mg | ORAL_TABLET | Freq: Once | ORAL | Status: AC
  Administered 2012-12-15: 7.5 mg via ORAL
  Filled 2012-12-15: qty 1

## 2012-12-15 MED ORDER — ONDANSETRON HCL 4 MG/2ML IJ SOLN: 4.0000 mg | Freq: Four times a day (QID) | INTRAMUSCULAR | Status: DC | PRN

## 2012-12-15 MED ORDER — ATORVASTATIN CALCIUM 80 MG PO TABS
80.0000 mg | ORAL_TABLET | Freq: Every day | ORAL | Status: DC
  Administered 2012-12-15: 80 mg via ORAL
  Filled 2012-12-15 (×2): qty 1

## 2012-12-15 MED ORDER — ASPIRIN 81 MG PO CHEW
324.0000 mg | CHEWABLE_TABLET | Freq: Once | ORAL | Status: AC
  Administered 2012-12-15: 324 mg via ORAL
  Filled 2012-12-15: qty 4

## 2012-12-15 MED ORDER — AMLODIPINE BESYLATE 10 MG PO TABS
10.0000 mg | ORAL_TABLET | Freq: Every day | ORAL | Status: DC
  Administered 2012-12-16: 10 mg via ORAL
  Filled 2012-12-15: qty 1

## 2012-12-15 MED ORDER — INSULIN ASPART 100 UNIT/ML ~~LOC~~ SOLN
0.0000 [IU] | Freq: Three times a day (TID) | SUBCUTANEOUS | Status: DC
  Administered 2012-12-15: 8 [IU] via SUBCUTANEOUS
  Administered 2012-12-16: 11 [IU] via SUBCUTANEOUS
  Administered 2012-12-16: 8 [IU] via SUBCUTANEOUS

## 2012-12-15 MED ORDER — WARFARIN - PHARMACIST DOSING INPATIENT: Freq: Every day | Status: DC

## 2012-12-15 MED ORDER — FUROSEMIDE 40 MG PO TABS
40.0000 mg | ORAL_TABLET | Freq: Every day | ORAL | Status: DC
  Administered 2012-12-15 – 2012-12-16 (×2): 40 mg via ORAL
  Filled 2012-12-15 (×2): qty 1

## 2012-12-15 MED ORDER — INSULIN GLARGINE 100 UNIT/ML ~~LOC~~ SOLN
40.0000 [IU] | Freq: Every day | SUBCUTANEOUS | Status: DC
  Administered 2012-12-15: 40 [IU] via SUBCUTANEOUS
  Filled 2012-12-15 (×3): qty 0.4

## 2012-12-15 MED ORDER — ALLOPURINOL 300 MG PO TABS
300.0000 mg | ORAL_TABLET | Freq: Every day | ORAL | Status: DC
  Administered 2012-12-15 – 2012-12-16 (×2): 300 mg via ORAL
  Filled 2012-12-15 (×2): qty 1

## 2012-12-15 MED ORDER — ISOSORBIDE MONONITRATE ER 30 MG PO TB24
30.0000 mg | ORAL_TABLET | Freq: Every day | ORAL | Status: DC
  Administered 2012-12-15 – 2012-12-16 (×2): 30 mg via ORAL
  Filled 2012-12-15 (×2): qty 1

## 2012-12-15 MED ORDER — NITROGLYCERIN 0.4 MG SL SUBL: 0.4000 mg | SUBLINGUAL_TABLET | SUBLINGUAL | Status: DC | PRN

## 2012-12-15 MED ORDER — ACETAMINOPHEN 325 MG PO TABS
650.0000 mg | ORAL_TABLET | ORAL | Status: DC | PRN
  Administered 2012-12-16 (×2): 650 mg via ORAL
  Filled 2012-12-15 (×2): qty 2

## 2012-12-15 MED ORDER — LOSARTAN POTASSIUM 50 MG PO TABS
100.0000 mg | ORAL_TABLET | Freq: Every day | ORAL | Status: DC
Start: 2012-12-15 — End: 2012-12-16
  Administered 2012-12-15 – 2012-12-16 (×2): 100 mg via ORAL
  Filled 2012-12-15 (×2): qty 2

## 2012-12-15 MED ORDER — NITROGLYCERIN 2 % TD OINT
1.0000 [in_us] | TOPICAL_OINTMENT | Freq: Once | TRANSDERMAL | Status: AC
  Administered 2012-12-15: 1 [in_us] via TOPICAL
  Filled 2012-12-15: qty 1

## 2012-12-15 MED ORDER — POTASSIUM CHLORIDE CRYS ER 20 MEQ PO TBCR
20.0000 meq | EXTENDED_RELEASE_TABLET | Freq: Every day | ORAL | Status: DC
  Administered 2012-12-15 – 2012-12-16 (×2): 20 meq via ORAL
  Filled 2012-12-15 (×2): qty 1

## 2012-12-15 MED ORDER — INSULIN ASPART 100 UNIT/ML ~~LOC~~ SOLN
10.0000 [IU] | Freq: Once | SUBCUTANEOUS | Status: AC
  Administered 2012-12-15: 10 [IU] via SUBCUTANEOUS
  Filled 2012-12-15: qty 1

## 2012-12-15 MED ORDER — INSULIN ASPART 100 UNIT/ML ~~LOC~~ SOLN
0.0000 [IU] | Freq: Every day | SUBCUTANEOUS | Status: DC
  Administered 2012-12-15: 5 [IU] via SUBCUTANEOUS

## 2012-12-15 NOTE — Progress Notes (Signed)
ANTICOAGULATION CONSULT NOTE - Initial Consult  Pharmacy Consult for Coumadin Indication: atrial fibrillation  Allergies  Allergen Reactions  . Other Hives    Patient reports developing hives after receiving "some antibiotic years ago at [facility]". He does not know which antibiotic.    Patient Measurements: Height: 6' (182.9 cm) Weight: 205 lb 11 oz (93.3 kg) IBW/kg (Calculated) : 77.6 Heparin Dosing Weight:   Vital Signs: Temp: 98.9 F (37.2 C) (05/05 1906) Temp src: Oral (05/05 1906) BP: 187/114 mmHg (05/05 1906) Pulse Rate: 86 (05/05 1906)  Labs:  Recent Labs  12/15/12 1244  HGB 15.8  HCT 43.5  PLT 153  LABPROT 19.8*  INR 1.75*  CREATININE 1.06    Estimated Creatinine Clearance: 90.1 ml/min (by C-G formula based on Cr of 1.06).   Medical History: Past Medical History  Diagnosis Date  . Hypertension   . Gout   . Atrial fibrillation     On coumadin  . DIABETES MELLITUS, TYPE II 03/06/2009  . HYPERTENSION, BENIGN ESSENTIAL 04/07/2009  . LIVER FUNCTION TESTS, ABNORMAL, HX OF 12/30/2006  . History of cardiac arrhythmia 12/18/2011    S/p cardioversion ?    . LV dysfunction 12/18/2011    EF 60-65% by echo 12/2011; 2 D echo (2008)Overall left ventricular systolic function was moderately to markedly decreased.Left ventricular ejection fraction was estimated , range being 25% to 35 %.There was severe diffuse left ventricular hypokinesis.     . Rectal bleeding 12/18/2011    Scheduled for colonoscopy.    [name] HEPATITIS B, CHRONIC 12/30/2006  . OSTEOCHONDROMA 12/30/2006     LEFT SHOULDER   . HYPERCHOLESTEROLEMIA 07/11/2010  . MITRAL REGURGITATION 12/30/2006  . COLONIC POLYPS, HX OF 12/30/2006  . Heart murmur   . CAD (coronary artery disease), native coronary artery     Nonobstructive CAD by cath 2013 - diffuse distal and branch vessel CAD, no severe disease in the major coronaries, LV mild global hypokinesis, EF 45%  . Congestive heart failure, unspecified   . H/O  atrial flutter 2007    Medications:  Scheduled:  . allopurinol  300 mg Oral Daily  . [START ON 12/16/2012] amLODipine  10 mg Oral Daily  . [COMPLETED] aspirin  324 mg Oral Once  . atorvastatin  80 mg Oral QHS  . carvedilol  25 mg Oral BID WC  . furosemide  40 mg Oral Daily  . insulin aspart  0-15 Units Subcutaneous TID WC  . insulin aspart  0-5 Units Subcutaneous QHS  . [COMPLETED] insulin aspart  10 Units Subcutaneous Once  . insulin glargine  40 Units Subcutaneous QHS  . isosorbide mononitrate  30 mg Oral Daily  . losartan  100 mg Oral Daily  . [COMPLETED] nitroGLYCERIN  1 inch Topical Once  . potassium chloride SA  20 mEq Oral Daily  . ranolazine  500 mg Oral BID  . warfarin  7.5 mg Oral ONCE-1800  . [START ON 12/16/2012] Warfarin - Pharmacist Dosing Inpatient   Does not apply q1800    Assessment: 57 yr old male presents with chest pain. He takes coumadin for a.fib. His INR is 1.75. Has a history of CAD, DM, hypertention, and afib. Home dose of coumadin is 5 mg on MWFSS and 2.5 mg on Tue and Thurs. Has been noncompliant with his meds.  Goal of Therapy:  INR 2-3 Monitor platelets by anticoagulation protocol: Yes   Plan:  INR is a little low at 1.75. Will give a little extra warfarin from  his home dose to try to bump it up a little. Coumadin 7.5 mg tonight. INR daily.   Eugene Garnet 12/15/2012,7:58 PM

## 2012-12-15 NOTE — H&P (Addendum)
History and Physical  Patient ID: [name] [name] MRN: [id number], SOB: 1956-06-30 57 y.o. Date of Encounter: 12/15/2012, 3:11 PM  Primary Physician: Urgent Care Primary Cardiologist: Dr. Shirlee Latch  Chief Complaint: chest pain  HPI: 57 y.o. male w/ PMHx significant for CAD, DM2, HTN, paroxysmal afib (on coumadin), who presented to [facility] on 12/15/2012 with complaints of chest pain.  The patient notes that the evening prior to presentation, the patient was sitting on his couch watching TV, when he experienced a left-sided "pinching" pain, 7/10 in severity, associated with a shock-like "twinge" in his alternating left and right arms and shoulders.  The pain was also associated with diaphoresis and dyspnea, though no nausea.  The pain was unchanged by exertion, taking a deep breath, changing positions, or eating.  The pain resolved spontaneously in 15-20 minutes, but recurred several times last night and this morning.  The patient notes no recent trauma, coughing, or heartburn.  The patient does note that he has been noticing worsening SOB for the last 2 weeks, with dyspnea after walking about 100 yards (at baseline can walk 200-300 yards), though with no orthopnea, less PND than baseline, and no LE edema.  The patient has been non-compliant with aspirin, and has taken his other medications only sporadically over the last few weeks-months.  The patient had a prior cath in 12/2011, which showed diffuse distal and branch vessel disease not amenable to intervention.  Echo 12/18/11 showed EF 60-65%, though cath at that time showed EF 45%.  EKG revealed NSR with no acute ST changes. CXR was without acute cardiopulmonary abnormalities. Labs are significant for a negative troponin x1.   Past Medical History  Diagnosis Date  . Hypertension   . Gout   . Atrial fibrillation     On coumadin  . DIABETES MELLITUS, TYPE II 03/06/2009  . HYPERTENSION, BENIGN ESSENTIAL 04/07/2009  . LIVER FUNCTION TESTS,  ABNORMAL, HX OF 12/30/2006  . History of cardiac arrhythmia 12/18/2011    S/p cardioversion ?    . LV dysfunction 12/18/2011    EF 60-65% by echo 12/2011; 2 D echo (2008)Overall left ventricular systolic function was moderately to markedly decreased.Left ventricular ejection fraction was estimated , range being 25% to 35 %.There was severe diffuse left ventricular hypokinesis.     . Rectal bleeding 12/18/2011    Scheduled for colonoscopy.    [name] HEPATITIS B, CHRONIC 12/30/2006  . OSTEOCHONDROMA 12/30/2006     LEFT SHOULDER   . HYPERCHOLESTEROLEMIA 07/11/2010  . MITRAL REGURGITATION 12/30/2006  . COLONIC POLYPS, HX OF 12/30/2006  . Heart murmur   . CAD (coronary artery disease), native coronary artery     Nonobstructive CAD by cath 2013 - diffuse distal and branch vessel CAD, no severe disease in the major coronaries, LV mild global hypokinesis, EF 45%  . Congestive heart failure, unspecified   . H/O atrial flutter 2007     Surgical History:  Past Surgical History  Procedure Laterality Date  . Osteochondroma excision  1972  . Coronary angioplasty  12/05/01  . A flutter ablation  2007, 2008    catheter ablation      Home Meds: Prior to Admission medications   Medication Sig Start Date End Date Taking? Authorizing Provider  furosemide (LASIX) 40 MG tablet Take 1 tablet (40 mg total) by mouth daily. 12/02/12  Yes Nishant Dhungel, MD  insulin aspart (NOVOLOG) 100 unit/ml SOLN Inject 7 Units into the skin 3 (three) times daily with meals.   Yes  Historical Provider, MD  insulin glargine (LANTUS) 100 UNIT/ML injection Inject 40 Units into the skin at bedtime.   Yes Historical Provider, MD  metFORMIN (GLUCOPHAGE) 1000 MG tablet Take 1 tablet (1,000 mg total) by mouth 2 (two) times daily. 12/02/12  Yes Nishant Dhungel, MD  warfarin (COUMADIN) 5 MG tablet Take 2.5-5 mg by mouth daily. Monday, Wednesday, Friday, Saturday, and Sunday take 5 mg once daily Tuesday & Thursday take 2.5 mg once daily   Yes  Historical Provider, MD  allopurinol (ZYLOPRIM) 300 MG tablet Take 1 tablet (300 mg total) by mouth daily. 07/28/12   Shanker Levora Dredge, MD  amLODipine (NORVASC) 10 MG tablet Take 1 tablet (10 mg total) by mouth daily. 11/01/12   Doug Sou, MD  atorvastatin (LIPITOR) 80 MG tablet Take 1 tablet (80 mg total) by mouth daily. 12/02/12   Nishant Dhungel, MD  carvedilol (COREG) 25 MG tablet Take 1 tablet (25 mg total) by mouth 2 (two) times daily with a meal. 12/02/12   Nishant Dhungel, MD  colchicine (COLCRYS) 0.6 MG tablet Take 0.6 mg by mouth 3 (three) times daily as needed (Gout flare up).     Historical Provider, MD  Insulin Pen Needle (NOVOFINE) 30G X 8 MM MISC Inject 10 each into the skin as needed. 12/02/12   Nishant Dhungel, MD  isosorbide mononitrate (IMDUR) 30 MG 24 hr tablet Take 1 tablet (30 mg total) by mouth daily. 12/02/12   Nishant Dhungel, MD  nitroGLYCERIN (NITROSTAT) 0.4 MG SL tablet Place 1 tablet (0.4 mg total) under the tongue every 5 (five) minutes x 3 doses as needed for chest pain (up to 3 doses). 08/22/12 08/22/13  [name], MD  potassium chloride SA (K-DUR,KLOR-CON) 20 MEQ tablet Take 1 tablet (20 mEq total) by mouth daily. 11/01/12   Doug Sou, MD  ranolazine (RANEXA) 500 MG 12 hr tablet Take 1 tablet (500 mg total) by mouth 2 (two) times daily. 11/01/12   Doug Sou, MD    Allergies:  Allergies  Allergen Reactions  . Other Hives    Patient reports developing hives after receiving "some antibiotic years ago at [facility]". He does not know which antibiotic.    History   Social History  . Marital Status: Married    Spouse Name: N/A    Number of Children: 2  . Years of Education: N/A   Occupational History  .     Social History Main Topics  . Smoking status: Former Smoker -- 0.20 packs/day for 18 years    Quit date: 08/14/1983  . Smokeless tobacco: Never Used  . Alcohol Use: 7.0 oz/week    14 drink(s) per week  . Drug Use: Yes     Special: Marijuana     Comment: h/o recreational use  . Sexually Active: Yes   Other Topics Concern  . Not on file   Social History Narrative  . No narrative on file     Family History  Problem Relation Age of Onset  . Diabetes Mother   . Hypertension Mother     Review of Systems: General: negative for chills, fever, night sweats or weight changes.  ENT: negative for rhinorrhea or epistaxis Cardiovascular: see HPI Dermatological: negative for rash Respiratory: negative for cough or wheezing GI: negative for nausea, vomiting, diarrhea, bright red blood per rectum, melena, or hematemesis GU: no hematuria, urgency, or frequency Neurologic: negative for visual changes, syncope, headache, or dizziness Heme: no easy bruising or bleeding Endo: negative for  thyroid disorder, or flushing Musculoskeletal: negative for joint pain or swelling, negative for myalgias All other systems reviewed and are otherwise negative except as noted above.  Physical Exam: Blood pressure 182/112, pulse 87, temperature 98.8 F (37.1 C), temperature source Oral, resp. rate 18, SpO2 98.00%. General: Well developed, well nourished, alert and oriented, in no acute distress. HEENT: Normocephalic, atraumatic, sclera non-icteric, no xanthomas, nares are without discharge.  Neck: Supple. JVP normal Lungs: Clear bilaterally to auscultation without wheezes, rales, or rhonchi. Breathing is unlabored. Heart: RRR with normal S1 and S2. No murmurs, rubs, or gallops appreciated. Abdomen: round, non-tender, non-distended with normoactive bowel sounds. No hepatomegaly. No rebound/guarding. No obvious abdominal masses. Back: No CVA tenderness Msk:  Strength and tone appear normal for age. Extremities: No clubbing, cyanosis, or edema.  Neuro: CNII-XII intact, moves all extremities spontaneously. Psych:  Responds to questions appropriately with a normal affect.   Labs:   Lab Results  Component Value Date   WBC 5.3  12/15/2012   HGB 15.8 12/15/2012   HCT 43.5 12/15/2012   MCV 77.4* 12/15/2012   PLT 153 12/15/2012    Recent Labs Lab 12/15/12 1244  NA 132*  K 4.0  CL 96  CO2 24  BUN 15  CREATININE 1.06  CALCIUM 10.1  GLUCOSE 431*   No results found for this basename: CKTOTAL, CKMB, TROPONINI,  in the last 72 hours Lab Results  Component Value Date   CHOL 211* 10/20/2012   HDL 46.80 10/20/2012   LDLCALC 82 09/09/2012   TRIG 152.0* 10/20/2012   Lab Results  Component Value Date   DDIMER  Value: 0.22        AT THE INHOUSE ESTABLISHED CUTOFF VALUE OF 0.48 ug/mL FEU, THIS ASSAY HAS BEEN DOCUMENTED IN THE LITERATURE TO HAVE A SENSITIVITY AND NEGATIVE PREDICTIVE VALUE OF AT LEAST 98 TO 99%.  THE TEST RESULT SHOULD BE CORRELATED WITH AN ASSESSMENT OF THE CLINICAL PROBABILITY OF DVT / VTE. 06/08/2010    Radiology/Studies:  Dg Chest 2 View  12/15/2012  *RADIOLOGY REPORT*  Clinical Data: Chest pain  CHEST - 2 VIEW  Comparison: Prior chest x-ray 06/08/2010  Findings: Stable borderline cardiomegaly.  Negative for edema, effusion, pneumothorax, focal airspace consolidation or pulmonary nodule.  No acute osseous abnormality.  IMPRESSION: No acute cardiopulmonary disease.  Stable borderline cardiomegaly.   Original Report Authenticated By: Malachy Moan, M.D.      EKG: NSR, mild J-point elevation in leads V1-V4 (seen on prior EKG), mild ST depression in V6 (seen on prior EKG)  ASSESSMENT AND PLAN:  The patient is a 57 yo man, history of CAD, DM, HTN, afib, presenting with gout.  # Chest Pain - the patient notes a 1-day history of atypical chest pain, with negative troponin x1 and no EKG changes.  His TIMI score is 3 (CAD risk factors, prior stenosis >=50%, 2 anginal episodes), indicating a 14-day event risk of 13.2%.  The patient's last cath showed CAD non-amenable to intervention.  Chest pain may represent ACS vs GERD vs msk.  Wells score is 0 for PE (low probability). -admit to telemetry -cycle CE's x3 -repeat  EKG in AM -continue aspirin -continue atorvastatin -continue coreg -continue losartan -continue ranexa -consider inpatient vs outpatient stress test  # Shortness of breath - the patient notes a 2-week history of worsening dyspnea on exertion, though without leg swelling, orthopnea, or PND.  Pro-BNP was 168, making CHF exacerbation less likely.  Differential includes deconditioning (pt admits to lack of exercise)  vs very mild CHF exac. -continue home lasix -record strict I/O's and daily weights  # Afib - the patient has a history of afib, currently in NSR -continue coumadin per pharmacy  # HTN - elevated today in setting of medication non-compliance -restart coreg, losartan, and imdur. -add amlodipine if BP tolerates  Signed, Janalyn Harder, PGY2 12/15/2012, 3:11 PM  As above, patient seen and examined. I agree with his past medical history, social history, family history, review of systems and physical examination. Briefly he is a 57 year old male with a past medical history of coronary disease, diabetes, hypertension, paroxysmal atrial fibrillation, for evaluation of chest pain. Last cardiac catheterization in May of 2013 showed 40% proximal LAD, 50 mid to distal LAD and 70 ostial D1. The circumflex had a 50% proximal and mid stenosis. There was a 90% ostial small first branch off of his OM1 and a 90% ostial stenosis of a small second branch of his OM1. There is a 50% mid circumflex. There was a 50% PDA and a 60% distal PDA. Ejection fraction 45%. Medical therapy recommended. Patient presents for 36 hours of chest pain. The pain is in the left breast area and is focal. It is described as a pinch lasting seconds. He also feels sharp pains for one to 2 seconds in his left upper chest. These occur both with exertion and at rest. He also notes dyspnea on exertion but no orthopnea, PND or pedal edema. He is occasionally noncompliant with his medications. Initial enzymes negative. Electrocardiogram  shows sinus rhythm at a rate of 79. There are nonspecific inferior/lateral T-wave changes. Plan admit and rule out myocardial infarction with serial enzymes. If negative patient can be discharged tomorrow morning in followup with Dr. Shirlee Latch. He has not been taking all of his medications routinely. Continue Coumadin given history of PAF and multiple embolic risk factors. Not on aspirin given need for Coumadin. Continue beta blocker, ARB and statin. Monitor blood pressure and adjust regimen as needed. Note hemoglobin A1c is 11. He will need close followup with primary care for adjustment of his regimen. Some of this clearly is related to compliance. Olga Millers 4:08 PM

## 2012-12-15 NOTE — ED Provider Notes (Signed)
History     CSN: [id number]  Arrival date & time 12/15/12  1223   First MD Initiated Contact with Patient 12/15/12 1256      Chief Complaint  Patient presents with  . Chest Pain    (Consider location/radiation/quality/duration/timing/severity/associated sxs/prior treatment) HPI Comments: Patient presents to the ER for evaluation of chest pain. Patient reports that he has been experiencing intermittent chest pain since yesterday. Patient reports that he has had some shortness of breath as well as nausea associated with the pain. Pain has occurred while at rest, he has not noticed exertional chest pain. He does, however, report increased difficulty breathing with exertion. He is a diabetic, reports that his blood sugars have been poorly controlled recently. Usually has been running 300-400.  Patient is a 57 y.o. male presenting with chest pain.  Chest Pain Associated symptoms: shortness of breath     Past Medical History  Diagnosis Date  . Hypertension   . Gout   . Atrial fibrillation     On coumadin  . DIABETES MELLITUS, TYPE II 03/06/2009  . HYPERTENSION, BENIGN ESSENTIAL 04/07/2009  . LIVER FUNCTION TESTS, ABNORMAL, HX OF 12/30/2006  . History of cardiac arrhythmia 12/18/2011    S/p cardioversion ?    . LV dysfunction 12/18/2011    EF 60-65% by echo 12/2011; 2 D echo (2008)Overall left ventricular systolic function was moderately to markedly decreased.Left ventricular ejection fraction was estimated , range being 25% to 35 %.There was severe diffuse left ventricular hypokinesis.     . Rectal bleeding 12/18/2011    Scheduled for colonoscopy.    [name] HEPATITIS B, CHRONIC 12/30/2006  . OSTEOCHONDROMA 12/30/2006     LEFT SHOULDER   . HYPERCHOLESTEROLEMIA 07/11/2010  . MITRAL REGURGITATION 12/30/2006  . COLONIC POLYPS, HX OF 12/30/2006  . Heart murmur   . CAD (coronary artery disease), native coronary artery     Nonobstructive CAD by cath 2013 - diffuse distal and branch vessel CAD, no severe  disease in the major coronaries, LV mild global hypokinesis, EF 45%  . Congestive heart failure, unspecified   . H/O atrial flutter 2007    Past Surgical History  Procedure Laterality Date  . Osteochondroma excision  1972  . Coronary angioplasty  12/05/01  . A flutter ablation  2007, 2008    catheter ablation     Family History  Problem Relation Age of Onset  . Diabetes Mother   . Hypertension Mother     History  Substance Use Topics  . Smoking status: Former Games developer  . Smokeless tobacco: Never Used  . Alcohol Use: Yes     Comment: occassionally      Review of Systems  Respiratory: Positive for shortness of breath.   Cardiovascular: Positive for chest pain.  All other systems reviewed and are negative.    Allergies  Other  Home Medications   Current Outpatient Rx  Name  Route  Sig  Dispense  Refill  . ALLOPURINOL PO   Oral   Take 1 tablet by mouth daily.         [name] AmLODIPine Besylate (NORVASC PO)   Oral   Take 1 tablet by mouth daily.         [name] CARVEDILOL PO   Oral   Take 1 tablet by mouth 2 (two) times daily.         . furosemide (LASIX) 40 MG tablet   Oral   Take 1 tablet (40 mg total) by mouth daily.  30 tablet   0   . ISOSORBIDE MONONITRATE PO   Oral   Take 1 tablet by mouth daily.         [name] LOSARTAN POTASSIUM PO   Oral   Take 1 tablet by mouth daily.         . metFORMIN (GLUCOPHAGE) 1000 MG tablet   Oral   Take 1 tablet (1,000 mg total) by mouth 2 (two) times daily.   60 tablet   3   . Rosuvastatin Calcium (CRESTOR PO)   Oral   Take 1 tablet by mouth at bedtime.         [name] warfarin (COUMADIN) 5 MG tablet   Oral   Take 2.5-5 mg by mouth daily. Monday, Wednesday, Friday, Saturday, and Sunday take 5 mg once daily Tuesday & Thursday take 2.5 mg once daily         . allopurinol (ZYLOPRIM) 300 MG tablet   Oral   Take 1 tablet (300 mg total) by mouth daily.   30 tablet   3   . amLODipine (NORVASC) 10 MG tablet    Oral   Take 1 tablet (10 mg total) by mouth daily.   30 tablet   0   . atorvastatin (LIPITOR) 80 MG tablet   Oral   Take 1 tablet (80 mg total) by mouth daily.   30 tablet   3   . carvedilol (COREG) 25 MG tablet   Oral   Take 1 tablet (25 mg total) by mouth 2 (two) times daily with a meal.   60 tablet   0   . colchicine (COLCRYS) 0.6 MG tablet   Oral   Take 0.6 mg by mouth 3 (three) times daily as needed (Gout flare up).          . Insulin Pen Needle (NOVOFINE) 30G X 8 MM MISC   Subcutaneous   Inject 10 each into the skin as needed.   5 packet   2   . isosorbide mononitrate (IMDUR) 30 MG 24 hr tablet   Oral   Take 1 tablet (30 mg total) by mouth daily.   30 tablet   3   . losartan (COZAAR) 100 MG tablet   Oral   Take 1 tablet (100 mg total) by mouth daily.   30 tablet   3   . nitroGLYCERIN (NITROSTAT) 0.4 MG SL tablet   Sublingual   Place 1 tablet (0.4 mg total) under the tongue every 5 (five) minutes x 3 doses as needed for chest pain (up to 3 doses).   100 tablet   3   . potassium chloride SA (K-DUR,KLOR-CON) 20 MEQ tablet   Oral   Take 1 tablet (20 mEq total) by mouth daily.   30 tablet   0   . ranolazine (RANEXA) 500 MG 12 hr tablet   Oral   Take 1 tablet (500 mg total) by mouth 2 (two) times daily.   60 tablet   0   . warfarin (COUMADIN) 5 MG tablet   Oral   Take 2.5-5 mg by mouth daily. Take 2.5mg  on Tuesday and Thursday. Take 5mg  the rest of the week           BP 182/112  Pulse 87  Temp(Src) 98.8 F (37.1 C) (Oral)  Resp 18  SpO2 98%  Physical Exam  Constitutional: He is oriented to person, place, and time. He appears well-developed and well-nourished. No distress.  HENT:  Head: Normocephalic and atraumatic.  Right Ear: Hearing normal.  Nose: Nose normal.  Mouth/Throat: Oropharynx is clear and moist and mucous membranes are normal.  Eyes: Conjunctivae and EOM are normal. Pupils are equal, round, and reactive to light.  Neck:  Normal range of motion. Neck supple.  Cardiovascular: Normal rate, regular rhythm, S1 normal and S2 normal.  Exam reveals no gallop and no friction rub.   No murmur heard. Pulmonary/Chest: Effort normal and breath sounds normal. No respiratory distress. He exhibits no tenderness.  Abdominal: Soft. Normal appearance and bowel sounds are normal. There is no hepatosplenomegaly. There is no tenderness. There is no rebound, no guarding, no tenderness at McBurney's point and negative Murphy's sign. No hernia.  Musculoskeletal: Normal range of motion.  Neurological: He is alert and oriented to person, place, and time. He has normal strength. No cranial nerve deficit or sensory deficit. Coordination normal. GCS eye subscore is 4. GCS verbal subscore is 5. GCS motor subscore is 6.  Skin: Skin is warm, dry and intact. No rash noted. No cyanosis.  Psychiatric: He has a normal mood and affect. His speech is normal and behavior is normal. Thought content normal.    ED Course  Procedures (including critical care time)  EKG:  Date: 12/15/2012  Rate: 79  Rhythm: normal sinus rhythm  QRS Axis: normal  Intervals: normal  ST/T Wave abnormalities: nonspecific ST/T changes  Conduction Disutrbances:none  Narrative Interpretation:   Old EKG Reviewed: inferior and latet ST/T changes are old    Labs Reviewed  CBC - Abnormal; Notable for the following:    MCV 77.4 (*)    MCHC 36.3 (*)    All other components within normal limits  BASIC METABOLIC PANEL - Abnormal; Notable for the following:    Sodium 132 (*)    Glucose, Bld 431 (*)    GFR calc non Af Amer 76 (*)    GFR calc Af Amer 88 (*)    All other components within normal limits  PRO B NATRIURETIC PEPTIDE - Abnormal; Notable for the following:    Pro B Natriuretic peptide (BNP) 168.7 (*)    All other components within normal limits  PROTIME-INR - Abnormal; Notable for the following:    Prothrombin Time 19.8 (*)    INR 1.75 (*)    All other  components within normal limits  GLUCOSE, CAPILLARY - Abnormal; Notable for the following:    Glucose-Capillary 433 (*)    All other components within normal limits  POCT I-STAT TROPONIN I   Dg Chest 2 View  12/15/2012  *RADIOLOGY REPORT*  Clinical Data: Chest pain  CHEST - 2 VIEW  Comparison: Prior chest x-ray 06/08/2010  Findings: Stable borderline cardiomegaly.  Negative for edema, effusion, pneumothorax, focal airspace consolidation or pulmonary nodule.  No acute osseous abnormality.  IMPRESSION: No acute cardiopulmonary disease.  Stable borderline cardiomegaly.   Original Report Authenticated By: Malachy Moan, M.D.      Diagnoses: 1. Chest pain 2. Uncontrolled diabetes    MDM  This presents to the ER for evaluation of chest pain. Symptoms have been occurring intermittently since yesterday. Patient has multiple cardiac risk factors. He is a patient of [facility]. His initial EKG did show nonspecific ST and T wave changes in inferior and lateral leads, but comparison to previous EKG shows there is no change. Troponin was negative. BNP assay elevated, but around the patient's baseline. Patient was not having chest pain on arrival. He was mildly hypertensive. Patient treated with aspirin and nitro paste. Consult  to [facility] cardiology to determine ultimate disposition.        Gilda Crease, MD 12/15/12 [phone]

## 2012-12-15 NOTE — ED Notes (Signed)
Pt reports chest pain that started last night and radiates down into his arm. Reports that he is also a diabetic and his CBG has been in the 400. Reports some SOB with activity. Denies any n/v. Thinks he has cardiac hx.

## 2012-12-16 DIAGNOSIS — I059 Rheumatic mitral valve disease, unspecified: Secondary | ICD-10-CM

## 2012-12-16 DIAGNOSIS — I10 Essential (primary) hypertension: Secondary | ICD-10-CM

## 2012-12-16 LAB — GLUCOSE, CAPILLARY: Glucose-Capillary: 264 mg/dL — ABNORMAL HIGH (ref 70–99)

## 2012-12-16 LAB — BASIC METABOLIC PANEL
BUN: 17 mg/dL (ref 6–23)
Calcium: 9.7 mg/dL (ref 8.4–10.5)
Creatinine, Ser: 1.25 mg/dL (ref 0.50–1.35)
GFR calc non Af Amer: 62 mL/min — ABNORMAL LOW (ref >90)
Glucose, Bld: 237 mg/dL — ABNORMAL HIGH (ref 70–99)

## 2012-12-16 LAB — PROTIME-INR
INR: 1.89 — ABNORMAL HIGH (ref 0.00–1.49)
Prothrombin Time: 21 seconds — ABNORMAL HIGH (ref 11.6–15.2)

## 2012-12-16 LAB — CBC
MCH: 28 pg (ref 26.0–34.0)
MCHC: 36 g/dL (ref 30.0–36.0)
MCV: 77.9 fL — ABNORMAL LOW (ref 78.0–100.0)
Platelets: 149 10*3/uL — ABNORMAL LOW (ref 150–400)

## 2012-12-16 LAB — TROPONIN I: Troponin I: <0.3 ng/mL (ref <0.30)

## 2012-12-16 MED ORDER — ATORVASTATIN CALCIUM 80 MG PO TABS: 80.0000 mg | ORAL_TABLET | Freq: Every day | ORAL | Status: DC

## 2012-12-16 MED ORDER — WARFARIN SODIUM 5 MG PO TABS
5.0000 mg | ORAL_TABLET | Freq: Once | ORAL | Status: DC
  Filled 2012-12-16: qty 1

## 2012-12-16 NOTE — Progress Notes (Signed)
DC IV, DC Tele, DC Home. Discharge instructions and home medications discussed with patient and patient's daughter. Patient and daughter denied any questions or concerns at this time. Patient leaving unit via wheelchair and appears in no acute distress. 

## 2012-12-16 NOTE — Progress Notes (Signed)
  Echocardiogram 2D Echocardiogram has been performed.  Georgian Co 12/16/2012, 11:52 AM

## 2012-12-16 NOTE — Progress Notes (Signed)
Utilization Review Completed.   Mckyle Solanki, RN, BSN Nurse Case Manager  [phone]  

## 2012-12-16 NOTE — Discharge Summary (Signed)
CARDIOLOGY DISCHARGE SUMMARY   Patient ID: [name] [name] MRN: [id number] DOB/AGE: September 11, 1955 57 y.o.  Admit date: 12/15/2012 Discharge date: 12/16/2012  Primary Discharge Diagnosis:     Intermediate coronary syndrome/Anginal chest pain - medical therapy recommended  Secondary Discharge Diagnosis:    DIABETES MELLITUS, TYPE II   Chest pain   Chronic systolic CHF (congestive heart failure)   Atrial fibrillation  Procedures: 2-D echocardiogram  Hospital Course: [name] [name] is a 57 y.o. male with a history of CAD.  He had chest pain that was recurrent with multiple episodes. He has known distal disease, medical therapy recommended. He came to the emergency room because of the increase in frequency and intensity of his chest pain. He was admitted for further evaluation and treatment.  His cardiac enzymes were negative for MI. His ECG had no acute ischemic changes. His blood pressure was significantly elevated on admission at 182/112. He admitted to noncompliance at times with his medications. There was concern for poor control of his diabetes. Hemoglobin A1c was significantly elevated at 11.1. He is encouraged to stick to a diabetic diet, be compliant with all medications and follow up with his primary care physician for further management.   With restarting his home medications, his BP control improved. His blood sugars were controlled with home medications and SSI. His statin was changed to Lipitor for cost savings. His coumadin was followed by the pharmacy and he has a follow up appointment on Monday in the [facility].  12/16/2012, his chest pain had improved, his blood pressure control had improved and he was feeling much better. An echo was performed which showed preserved left ventricular function and no wall motion abnormalities. He was evaluated by Dr. Shirlee Latch and considered stable for discharge, to followup as an outpatient with a stress test and Coumadin appointment  early next week.  Labs:  Lab Results  Component Value Date   WBC 6.5 12/16/2012   HGB 15.2 12/16/2012   HCT 42.2 12/16/2012   MCV 77.9* 12/16/2012   PLT 149* 12/16/2012     Recent Labs Lab 12/16/12 0450  NA 141  K 4.0  CL 101  CO2 26  BUN 17  CREATININE 1.25  CALCIUM 9.7  GLUCOSE 237*    Recent Labs  12/15/12 1912 12/16/12 0015  TROPONINI <0.30 <0.30   Pro B Natriuretic peptide (BNP)  Date/Time Value Range Status  12/15/2012 12:30 PM 168.7* 0 - 125 pg/mL Final  08/22/2012 11:30 AM 19.0  0.0 - 100.0 pg/mL Final    Recent Labs  12/16/12 0450  INR 1.89*   Lab Results  Component Value Date   HGBA1C 11.1* 12/01/2012    Radiology: Dg Chest 2 View 12/15/2012  *RADIOLOGY REPORT*  Clinical Data: Chest pain  CHEST - 2 VIEW  Comparison: Prior chest x-ray 06/08/2010  Findings: Stable borderline cardiomegaly.  Negative for edema, effusion, pneumothorax, focal airspace consolidation or pulmonary nodule.  No acute osseous abnormality.  IMPRESSION: No acute cardiopulmonary disease.  Stable borderline cardiomegaly.   Original Report Authenticated By: Malachy Moan, M.D.    2D Echo: 12/16/2012 Study Conclusions - Left ventricle: Wall thickness was increased in a pattern of moderate LVH. Systolic function was normal. The estimated ejection fraction was in the range of 60% to 65%. - Left atrium: The atrium was mildly dilated.   EKG: 16-Dec-2012 05:22:45 Normal sinus rhythm ST & T wave abnormality, consider lateral ischemia Prolonged QT Vent. rate 81 BPM PR interval 170 ms QRS duration 90 ms  QT/QTc 420/487 ms P-R-T axes 64 33 -70  FOLLOW UP PLANS AND APPOINTMENTS Allergies  Allergen Reactions  . Other Hives    Patient reports developing hives after receiving "some antibiotic years ago at [facility]". He does not know which antibiotic.     Medication List    TAKE these medications       allopurinol 300 MG tablet  Commonly known as:  ZYLOPRIM  Take 300 mg by  mouth daily.     amLODipine 10 MG tablet  Commonly known as:  NORVASC  Take 1 tablet (10 mg total) by mouth daily.     atorvastatin 80 MG tablet  Commonly known as:  LIPITOR  Take 1 tablet (80 mg total) by mouth daily.     carvedilol 25 MG tablet  Commonly known as:  COREG  Take 1 tablet (25 mg total) by mouth 2 (two) times daily with a meal.     COLCRYS 0.6 MG tablet  Generic drug:  colchicine  Take 0.6 mg by mouth 3 (three) times daily as needed (Gout flare up).     CRESTOR 20 MG tablet  Generic drug:  rosuvastatin  Take 20 mg by mouth every evening.     furosemide 40 MG tablet  Commonly known as:  LASIX  Take 1 tablet (40 mg total) by mouth daily.     insulin aspart 100 unit/ml Soln  Commonly known as:  novoLOG  Inject 7 Units into the skin 3 (three) times daily with meals.     insulin glargine 100 UNIT/ML injection  Commonly known as:  LANTUS  Inject 40 Units into the skin at bedtime.     Insulin Pen Needle 30G X 8 MM Misc  Commonly known as:  NOVOFINE  Inject 10 each into the skin as needed.     isosorbide mononitrate 30 MG 24 hr tablet  Commonly known as:  IMDUR  Take 1 tablet (30 mg total) by mouth daily.     losartan 100 MG tablet  Commonly known as:  COZAAR  Take 100 mg by mouth daily.     metFORMIN 1000 MG tablet  Commonly known as:  GLUCOPHAGE  Take 1 tablet (1,000 mg total) by mouth 2 (two) times daily.     nitroGLYCERIN 0.4 MG SL tablet  Commonly known as:  NITROSTAT  Place 1 tablet (0.4 mg total) under the tongue every 5 (five) minutes x 3 doses as needed for chest pain (up to 3 doses).     potassium chloride SA 20 MEQ tablet  Commonly known as:  K-DUR,KLOR-CON  Take 1 tablet (20 mEq total) by mouth daily.     ranolazine 500 MG 12 hr tablet  Commonly known as:  RANEXA  Take 1 tablet (500 mg total) by mouth 2 (two) times daily.     warfarin 5 MG tablet  Commonly known as:  COUMADIN  Take 2.5-5 mg by mouth daily. Take 5 mg (one tablet) on  Monday, Wednesday, Friday, Saturday, and Sunday   Take 2.5 mg (1/2 tablet) on Tuesday & Thursday        Discharge Orders   Future Appointments Provider Department Dept Phone   12/22/2012 10:45 AM [facility] [phone]   12/22/2012 11:45 AM [facility] Irven Shelling) [facility] SITE 3 NUCLEAR MED [phone]   01/02/2013 9:45 AM [name], MD [facility] Main Office [place]) [phone]   01/12/2013 8:20 AM [facility]  Main Office [employer]) [phone]   Future Orders Complete By Expires     Diet - low sodium heart healthy  As directed     Diet Carb Modified  As directed     Increase activity slowly  As directed       Follow-up Information   Follow up with Marca Ancona, MD On 01/02/2013. (at 09:45 am)    Contact information:   [address] [place] [address] [phone]       Follow up with [facility] [address] On 12/22/2012. (Coumadin check and Stress test at 11:45 AM)    Contact information:   [address] [place] [address]       BRING ALL MEDICATIONS WITH YOU TO FOLLOW UP APPOINTMENTS  Time spent with patient to include physician time: 38 min Signed: Theodore Demark, PA-C 12/16/2012, 4:10 PM Co-Sign MD

## 2012-12-16 NOTE — Progress Notes (Addendum)
Patient ID: [name] [name], male   DOB: 10-08-1955, 57 y.o.   MRN: [id number]    SUBJECTIVE: No further chest pain.  No dyspnea.  He has a headache (BP is down now that he is back on home meds).   [name] allopurinol  300 mg Oral Daily  . amLODipine  10 mg Oral Daily  . atorvastatin  80 mg Oral QHS  . carvedilol  25 mg Oral BID WC  . furosemide  40 mg Oral Daily  . insulin aspart  0-15 Units Subcutaneous TID WC  . insulin aspart  0-5 Units Subcutaneous QHS  . insulin glargine  40 Units Subcutaneous QHS  . isosorbide mononitrate  30 mg Oral Daily  . losartan  100 mg Oral Daily  . potassium chloride SA  20 mEq Oral Daily  . ranolazine  500 mg Oral BID  . Warfarin - Pharmacist Dosing Inpatient   Does not apply q1800      Filed Vitals:   12/15/12 1232 12/15/12 1906 12/16/12 0517  BP: 182/112 187/114 117/80  Pulse: 87 86 76  Temp: 98.8 F (37.1 C) 98.9 F (37.2 C) 97.6 F (36.4 C)  TempSrc: Oral Oral Oral  Resp: 18 86 19  Height:  6' (1.829 m)   Weight:  205 lb 11 oz (93.3 kg) 202 lb 9.6 oz (91.899 kg)  SpO2: 98% 94% 95%    Intake/Output Summary (Last 24 hours) at 12/16/12 0744 Last data filed at 12/16/12 0700  Gross per 24 hour  Intake    360 ml  Output      0 ml  Net    360 ml    LABS: Basic Metabolic Panel:  Recent Labs  84/69/62 1244 12/16/12 0450  NA 132* 141  K 4.0 4.0  CL 96 101  CO2 24 26  GLUCOSE 431* 237*  BUN 15 17  CREATININE 1.06 1.25  CALCIUM 10.1 9.7   Liver Function Tests: No results found for this basename: AST, ALT, ALKPHOS, BILITOT, PROT, ALBUMIN,  in the last 72 hours No results found for this basename: LIPASE, AMYLASE,  in the last 72 hours CBC:  Recent Labs  12/15/12 1244 12/16/12 0450  WBC 5.3 6.5  HGB 15.8 15.2  HCT 43.5 42.2  MCV 77.4* 77.9*  PLT 153 149*   Cardiac Enzymes:  Recent Labs  12/15/12 1912 12/16/12 0015  TROPONINI <0.30 <0.30   BNP: No components found with this basename: POCBNP,  D-Dimer: No results  found for this basename: DDIMER,  in the last 72 hours Hemoglobin A1C: No results found for this basename: HGBA1C,  in the last 72 hours Fasting Lipid Panel: No results found for this basename: CHOL, HDL, LDLCALC, TRIG, CHOLHDL, LDLDIRECT,  in the last 72 hours Thyroid Function Tests: No results found for this basename: TSH, T4TOTAL, FREET3, T3FREE, THYROIDAB,  in the last 72 hours Anemia Panel: No results found for this basename: VITAMINB12, FOLATE, FERRITIN, TIBC, IRON, RETICCTPCT,  in the last 72 hours  RADIOLOGY: Dg Chest 2 View  12/15/2012  *RADIOLOGY REPORT*  Clinical Data: Chest pain  CHEST - 2 VIEW  Comparison: Prior chest x-ray 06/08/2010  Findings: Stable borderline cardiomegaly.  Negative for edema, effusion, pneumothorax, focal airspace consolidation or pulmonary nodule.  No acute osseous abnormality.  IMPRESSION: No acute cardiopulmonary disease.  Stable borderline cardiomegaly.   Original Report Authenticated By: Malachy Moan, M.D.     PHYSICAL EXAM General: NAD Neck: No JVD, no thyromegaly or thyroid nodule.  Lungs: Clear to  auscultation bilaterally with normal respiratory effort. CV: Nondisplaced PMI.  Heart regular S1/S2, no S3/S4, no murmur.  No peripheral edema.  No carotid bruit.  Normal pedal pulses.  Abdomen: Soft, nontender, no hepatosplenomegaly, no distention.  Neurologic: Alert and oriented x 3.  Psych: Normal affect. Extremities: No clubbing or cyanosis.   TELEMETRY: Reviewed telemetry pt in NSR  ASSESSMENT AND PLAN: 57 yo with history of CAD, CHF (presumed primarily diastolic), and HTN presented with dyspnea and atypical chest pain.  1. CAD: Atypical chest pain in setting of very high blood pressure.  He has been off of a number of his medications.  He has a history of distal/branch vessel CAD.  Cardiac enzymes negative, ECG appears to show LVH with repolarization abnormality.  Has history of chronic angina that is typically controlled by ranolazine.  -  Restart home medications.  Will use atorvastatin instead of Crestor because of expense.  - I think that he can be discharged for an outpatient stress test.  2. Cardiomyopathy: EF 45% on prior echo, suspect ischemic cardiomyopathy.  Will repeat echocardiogram this morning.   3. HTN: BP better now that he is back on home medications.  Continue home meds.  4. PAF: In NSR.  Continue warfarin. 5. Disposition: Home today.  Needs followup in 1 week with me or PA.  Needs outpatient stress myoview.  Followup with coumadin clinic.   Meds: Home diabetes medications (needs followup with PCP for adjustment), warfarin, amlodipine 10 mg daily, atorvastatin 80 mg daily (replaces Crestor), ranolazine 500 mg bid, losartan 100, Coreg 25 mg bid, Lasix 40 mg daily, KCl 20 mEq daily, Imdur 30 mg daily.  Will involve care management to help with meds.   Marca Ancona 12/16/2012 7:51 AM

## 2012-12-16 NOTE — Progress Notes (Signed)
ANTICOAGULATION CONSULT NOTE - Follow-up  Pharmacy Consult for Coumadin Indication: atrial fibrillation  Allergies  Allergen Reactions  . Other Hives    Patient reports developing hives after receiving "some antibiotic years ago at [facility]". He does not know which antibiotic.    Patient Measurements: Height: 6' (182.9 cm) Weight: 202 lb 9.6 oz (91.899 kg) (Scale A) IBW/kg (Calculated) : 77.6  Vital Signs: Temp: 97.6 F (36.4 C) (05/06 0517) Temp src: Oral (05/06 0517) BP: 117/80 mmHg (05/06 0517) Pulse Rate: 76 (05/06 0517)  Labs:  Recent Labs  12/15/12 1244 12/15/12 1912 12/16/12 0015 12/16/12 0450  HGB 15.8  --   --  15.2  HCT 43.5  --   --  42.2  PLT 153  --   --  149*  LABPROT 19.8*  --   --  21.0*  INR 1.75*  --   --  1.89*  CREATININE 1.06  --   --  1.25  TROPONINI  --  <0.30 <0.30  --     Estimated Creatinine Clearance: 70.7 ml/min (by C-G formula based on Cr of 1.25).  Assessment: 57 yr old male presents with chest pain. He continues on coumadin for afib. INR today remains subtherapeutic at 1.89. CBC is stable, no bleeding noted.   Goal of Therapy:  INR 2-3   Plan:  1. Coumadin 5mg  PO x 1 tonight (normal home dose today is 2.5mg ) 2. F/u AM INR if pt is still admitted 3. If discharged, would recommend 5mg  tonight then resume home regimen  Lysle Pearl, PharmD, BCPS Pager # [phone] 12/16/2012 9:03 AM

## 2012-12-22 ENCOUNTER — Ambulatory Visit (INDEPENDENT_AMBULATORY_CARE_PROVIDER_SITE_OTHER): Payer: Self-pay

## 2012-12-22 ENCOUNTER — Ambulatory Visit (HOSPITAL_COMMUNITY): Payer: Self-pay | Attending: Cardiology | Admitting: Radiology

## 2012-12-22 VITALS — BP 142/97 | HR 69 | Ht 72.0 in | Wt 215.0 lb

## 2012-12-22 DIAGNOSIS — I4891 Unspecified atrial fibrillation: Secondary | ICD-10-CM

## 2012-12-22 DIAGNOSIS — Z9861 Coronary angioplasty status: Secondary | ICD-10-CM | POA: Insufficient documentation

## 2012-12-22 DIAGNOSIS — E785 Hyperlipidemia, unspecified: Secondary | ICD-10-CM | POA: Insufficient documentation

## 2012-12-22 DIAGNOSIS — R0609 Other forms of dyspnea: Secondary | ICD-10-CM | POA: Insufficient documentation

## 2012-12-22 DIAGNOSIS — E119 Type 2 diabetes mellitus without complications: Secondary | ICD-10-CM | POA: Insufficient documentation

## 2012-12-22 DIAGNOSIS — R0989 Other specified symptoms and signs involving the circulatory and respiratory systems: Secondary | ICD-10-CM | POA: Insufficient documentation

## 2012-12-22 DIAGNOSIS — Z7901 Long term (current) use of anticoagulants: Secondary | ICD-10-CM

## 2012-12-22 DIAGNOSIS — R0602 Shortness of breath: Secondary | ICD-10-CM

## 2012-12-22 DIAGNOSIS — R55 Syncope and collapse: Secondary | ICD-10-CM | POA: Insufficient documentation

## 2012-12-22 DIAGNOSIS — Z87891 Personal history of nicotine dependence: Secondary | ICD-10-CM | POA: Insufficient documentation

## 2012-12-22 DIAGNOSIS — Z794 Long term (current) use of insulin: Secondary | ICD-10-CM | POA: Insufficient documentation

## 2012-12-22 DIAGNOSIS — R42 Dizziness and giddiness: Secondary | ICD-10-CM | POA: Insufficient documentation

## 2012-12-22 DIAGNOSIS — I10 Essential (primary) hypertension: Secondary | ICD-10-CM | POA: Insufficient documentation

## 2012-12-22 DIAGNOSIS — R0789 Other chest pain: Secondary | ICD-10-CM | POA: Insufficient documentation

## 2012-12-22 DIAGNOSIS — R079 Chest pain, unspecified: Secondary | ICD-10-CM

## 2012-12-22 DIAGNOSIS — R61 Generalized hyperhidrosis: Secondary | ICD-10-CM | POA: Insufficient documentation

## 2012-12-22 MED ORDER — TECHNETIUM TC 99M SESTAMIBI GENERIC - CARDIOLITE
11.0000 | Freq: Once | INTRAVENOUS | Status: AC | PRN
  Administered 2012-12-22: 11 via INTRAVENOUS

## 2012-12-22 MED ORDER — TECHNETIUM TC 99M SESTAMIBI GENERIC - CARDIOLITE
33.0000 | Freq: Once | INTRAVENOUS | Status: AC | PRN
  Administered 2012-12-22: 33 via INTRAVENOUS

## 2012-12-22 NOTE — Progress Notes (Signed)
[facility] SITE 3 NUCLEAR MED [address] [place], [place] [address] [phone]    Cardiology Nuclear Med Study  [name] [name] is a 57 y.o. male     MRN : [id number]     DOB: 06-28-1956  Procedure Date: 12/22/2012  Nuclear Med Background Indication for Stress Test:  Evaluation for Ischemia, PTCA Patency and Post Hospital on 12/15/12 for Chest Pain, HTN Urgency with Negative Enzymes  History:  '03 PTCA; '07/'08 RFA; '13 Cath:n/o CAD, EF=45%; 12/16/12 Echo:EF=50-55% Cardiac Risk Factors: History of Smoking, Hypertension, IDDM Type 2 and Lipids  Symptoms:  Chest Pain/Pressure with and without Exertion (last episode of chest discomfort is now, 2-3/10), Diaphoresis, Dizziness, DOE and Near Syncope   Nuclear Pre-Procedure Caffeine/Decaff Intake:  None > 12 hrs NPO After: 9:30pm   Lungs:  Clear. O2 Sat: 100% on room air. IV 0.9% NS with Angio Cath:  20g  IV Site: R Antecubital x 1, tolerated well IV Started by:  Irean Hong, RN  Chest Size (in):  42 Cup Size: n/a  Height: 6' (1.829 m)  Weight:  215 lb (97.523 kg)  BMI:  Body mass index is 29.15 kg/(m^2). Tech Comments:  Fasting CBG was 309@ 9:00 am; No medications today. Coreg held x 36 hours    Nuclear Med Study 1 or 2 day study: 1 day  Stress Test Type:  Stress  Reading MD: Olga Millers, MD  Order Authorizing Provider:  Marca Ancona, MD  Resting Radionuclide: Technetium 8m Sestamibi  Resting Radionuclide Dose: 11.0 mCi   Stress Radionuclide:  Technetium 12m Sestamibi  Stress Radionuclide Dose: 33.0 mCi           Stress Protocol Rest HR: 69 Stress HR: 141  Rest BP: 142/97 Stress BP: 221/105  Exercise Time (min): 5:30 METS: 7.0   Predicted Max HR: 162 bpm % Max HR: 87.04 bpm Rate Pressure Product: 72536   Dose of Adenosine (mg):  n/a Dose of Lexiscan: n/a mg  Dose of Atropine (mg): n/a Dose of Dobutamine: n/a mcg/kg/min (at max HR)  Stress Test Technologist: Smiley Houseman, CMA-N  Nuclear Technologist:   Domenic Polite, CNMT     Rest Procedure:  Myocardial perfusion imaging was performed at rest 45 minutes following the intravenous administration of Technetium 1m Sestamibi.  Rest ECG: Sinus rhythm, nonspecific ST changes.  Stress Procedure:  The patient exercised on the treadmill utilizing the Bruce Protocol for 5:30 minutes. The patient stopped due to fatigue and hypertensive response.  He c/o chest tightness with exercise.  Technetium 13m Sestamibi was injected at peak exercise and myocardial perfusion imaging was performed after a brief delay.  EKG's and images were discussed with Dr. Shirlee Latch and he felt it was safe for the patient to leave.  Stress ECG: Significant ST abnormalities consistent with ischemia.  QPS Raw Data Images:  Acquisition technically good; LVE. Stress Images:  There is decreased uptake in the inferior wall. Rest Images:  There is decreased uptake in the inferior wall. Subtraction (SDS):  No evidence of ischemia. Transient Ischemic Dilatation (Normal <1.22):  1.16 Lung/Heart Ratio (Normal <0.45):  0.33  Quantitative Gated Spect Images QGS EDV:  181 ml QGS ESV:  124 ml  Impression Exercise Capacity:  Fair exercise capacity. BP Response:  Hypertensive blood pressure response. Clinical Symptoms:  There is dyspnea and chest pain. ECG Impression:  Significant ST abnormalities consistent with ischemia. Comparison with Prior Nuclear Study: No images to compare  Overall Impression:  High risk stress nuclear study with  a small, mild intensity, fixed inferior defect consistent with thinning vs infarct no ischemia; not significant ST changes and sypmtoms with exertion; study high risk due to reduced LV function..  LV Ejection Fraction: 31%.  LV Wall Motion:  Global hypokinesis.  Olga Millers

## 2013-01-02 ENCOUNTER — Ambulatory Visit: Payer: Self-pay | Admitting: Cardiology

## 2013-01-06 ENCOUNTER — Encounter: Payer: Self-pay | Admitting: Cardiology

## 2013-01-06 ENCOUNTER — Other Ambulatory Visit: Payer: Self-pay | Admitting: *Deleted

## 2013-01-06 ENCOUNTER — Ambulatory Visit (INDEPENDENT_AMBULATORY_CARE_PROVIDER_SITE_OTHER): Payer: Self-pay | Admitting: Cardiology

## 2013-01-06 VITALS — Ht 72.0 in | Wt 213.0 lb

## 2013-01-06 DIAGNOSIS — I509 Heart failure, unspecified: Secondary | ICD-10-CM

## 2013-01-06 DIAGNOSIS — I5022 Chronic systolic (congestive) heart failure: Secondary | ICD-10-CM

## 2013-01-06 DIAGNOSIS — I2510 Atherosclerotic heart disease of native coronary artery without angina pectoris: Secondary | ICD-10-CM

## 2013-01-06 DIAGNOSIS — I4891 Unspecified atrial fibrillation: Secondary | ICD-10-CM

## 2013-01-06 MED ORDER — AMLODIPINE BESYLATE 5 MG PO TABS: 5.0000 mg | ORAL_TABLET | Freq: Every day | ORAL | Status: DC

## 2013-01-06 MED ORDER — LOSARTAN POTASSIUM 100 MG PO TABS: 100.0000 mg | ORAL_TABLET | Freq: Every day | ORAL | Status: DC

## 2013-01-06 NOTE — Progress Notes (Signed)
Patient ID: [name] [name], male   DOB: Feb 25, 1956, 57 y.o.   MRN: [id number] PCP: [facility]  57 yo with history of HTN, diabetes, paroxysmal atrial fibrillation, and CAD presents for cardiology followup.  He was admitted in 5/13 with chest pain with exertion.  LHC was done showing global HK with EF 45% and diffuse, severe distal and branch vessel disease.  There was no interventional option, but I suspect that this disease could be causing his anginal-type pain.  Echo at that time was read as showing EF 60-65%.  His exertional chest pain seemed to improve considerably with ranolazine.  Since his last appointment in this office, he ran out of his medications and was admitted in 5/14 with hypertensive urgency and chest pain.  He ruled out for MI and BP was controlled.  His EF was 50-55% by echo.  ETT-Sestamibi done as outpatient showed small fixed inferior defect with no ischemia but EF was 31%.  He was quite hypertensive during the study.   Patient denies chest pain since discharge.  He is able to walk on flat ground without any problems.  Mild dyspnea going up steps.  He does report a "numbness" that has been present in his chest constantly for about 2 weeks but no pain (was present in hospital).   Weight is down 6 lbs since last appointment.    Labs (5/13): K 3.6, creatinine 0.93, LDL 85, HDL 57 Labs (7/13): K 3.9, creatinine 1.1, LDL 57, HDL 51  Labs (5/14): K 4, creatinine 1.25  PMH: 1. DM2 2. Gout 3. HTN: Cough with ACEI.  4. Nonischemic CMP: EF 35% in 2008.  LHC in 2010 showed mild nonobstructive disease.  Echo in 5/13 showed EF 60-65% with moderate LVH but EF was 45% on LV-gram in 5/13.  Echo (5/14) with EF 50-55%, mild LVH, inferobasal HK, mild MR.  ETT-Sestamibi in 5/14, however, showed EF 31%.   5. CAD: LHC in 2010 with mild nonobstructive disease.  LHC (5/13) with diffuse distal and branch vessel disease, mild global hypokinesis and EF 45%.  ETT-Sestamibi (5/14): EF 31%,  small fixed inferior defect with no ischemia.  6. H/o chronic HBV 7. Osteochondroma left shoulder.  8. Hyperlipidemia 9. Paroxysmal atrial fibrillation: Coumadin.  Developed cough and increased ESR with amiodarone.  10. Atrial flutter: had ablations in 2007 and 2008.   SH: Married, prior smoker.  Does use occasional marijuana.  Out of work Chiropractor. 2 daughters attend [employer] in [place].   FH: CAD  ROS: All systems reviewed and negative except as per HPI.   Current Outpatient Prescriptions  Medication Sig Dispense Refill  . allopurinol (ZYLOPRIM) 300 MG tablet Take 300 mg by mouth daily.      [name] amLODipine (NORVASC) 10 MG tablet Take 1 tablet (10 mg total) by mouth daily.  30 tablet  0  . atorvastatin (LIPITOR) 80 MG tablet Take 1 tablet (80 mg total) by mouth daily.  30 tablet  3  . carvedilol (COREG) 25 MG tablet Take 1 tablet (25 mg total) by mouth 2 (two) times daily with a meal.  60 tablet  0  . colchicine (COLCRYS) 0.6 MG tablet Take 0.6 mg by mouth 3 (three) times daily as needed (Gout flare up).       . furosemide (LASIX) 40 MG tablet Take 1 tablet (40 mg total) by mouth daily.  30 tablet  0  . insulin aspart (NOVOLOG) 100 unit/ml SOLN Inject 7 Units into the skin 3 (three)  times daily with meals. novolog pen      . insulin glargine (LANTUS) 100 UNIT/ML injection Inject 40 Units into the skin at bedtime.      . isosorbide mononitrate (IMDUR) 30 MG 24 hr tablet Take 1 tablet (30 mg total) by mouth daily.  30 tablet  3  . losartan (COZAAR) 100 MG tablet Take 100 mg by mouth daily.      . metFORMIN (GLUCOPHAGE) 1000 MG tablet Take 1 tablet (1,000 mg total) by mouth 2 (two) times daily.  60 tablet  3  . nitroGLYCERIN (NITROSTAT) 0.4 MG SL tablet Place 1 tablet (0.4 mg total) under the tongue every 5 (five) minutes x 3 doses as needed for chest pain (up to 3 doses).  100 tablet  3  . potassium chloride SA (K-DUR,KLOR-CON) 20 MEQ tablet Take 1 tablet (20 mEq total) by mouth daily.  30  tablet  0  . ranolazine (RANEXA) 500 MG 12 hr tablet Take 1 tablet (500 mg total) by mouth 2 (two) times daily.  60 tablet  0  . warfarin (COUMADIN) 5 MG tablet Take 2.5-5 mg by mouth daily. Take 5 mg (one tablet) on Monday, Wednesday, Friday, Saturday, and Sunday  Take 2.5 mg (1/2 tablet) on Tuesday & Thursday       No current facility-administered medications for this visit.    Ht 6' (1.829 m)  Wt 213 lb (96.616 kg)  BMI 28.88 kg/m2 General: NAD Neck: JVP 7 cm, no thyromegaly or thyroid nodule.  Lungs: Clear to auscultation bilaterally with normal respiratory effort. CV: Nondisplaced PMI.  Heart regular S1/S2, no S3/S4, no murmur.  No peripheral edema.  No carotid bruit.  Normal pedal pulses.  Abdomen: Soft, nontender, no hepatosplenomegaly, no distention.  Neurologic: Alert and oriented x 3.  Psych: Normal affect. Extremities: No clubbing or cyanosis.   Assessment/Plan:  Atrial fibrillation Paroxysmal atrial fibrillation, continue coumadin. He is in NSR.  Coronary artery disease Diffuse distal and branch vessel disease not amenable to intervention on cath in 5/13. This disease certainly could cause angina.  Recent ETT-Sestamibi with small inferior fixed defect, no ischemia.  Angina is reasonably controlled today on current meds.  His chest pain when presenting to the hospital most recently may have been due to being off his medications and with uncontrolled blood pressure.  I do not think that the constant "chest numbness" is cardiac in nature.  - He is on warfarin so does not have to take ASA given stable disease.    - Continue current Coreg, Imdur, ranolazine, and amlodipine for angina control.   - Continue statin.  HYPERCHOLESTEROLEMIA  Now on atorvastatin, check lipids/LFTs in 2 months.  Ischemic Cardiomyopathy  EF 50-55% by recent echo but 31% by ETT-Sestamibi.  I think we need get some resolution as to his degree of LV dysfunction.  I will arrange for a cardiac MRI. He will  continue Coreg 25 mg bid.  He is not sure if he is taking losartan.  He will call us back to let us know, should be on 100 mg daily.  He appears euvolemic on current dose of Lasix.  Hypertension BP high today but he has not taken his morning meds.  I asked him to make sure to take his meds prior to next appt.   Followup in 1 month.   Marca Ancona 01/06/2013 9:49 AM

## 2013-01-06 NOTE — Patient Instructions (Addendum)
Your physician has requested that you have a cardiac MRI. Cardiac MRI uses a computer to create images of your heart as its beating, producing both still and moving pictures of your heart and major blood vessels. For further information please visit [email or web address]. Please follow the instruction sheet given to you today for more information.    Your physician recommends that you schedule a follow-up appointment in: 1 month with PA/NP.    Your physician recommends that you return for a FASTING lipid profile/liver profile in 2 months.   Call me so we can go over your medications. Luana Shu [phone]

## 2013-01-07 ENCOUNTER — Telehealth: Payer: Self-pay | Admitting: Cardiology

## 2013-01-07 NOTE — Telephone Encounter (Signed)
New Problem:    Patient called in wanting to speak with a nurse because the medications he was advised to get form [facility] is not covered by his medicaid and is too expensive.  Would like to know how to proceed.  Please call back.

## 2013-01-07 NOTE — Telephone Encounter (Signed)
Spoke with patient who complains that his medications are too expensive at [facility] and he needs to know what he can take instead.  I spoke with the pharmacy associate at [facility] who states that Amlodipine and Losartan will cost the patient around $70.  The only thing they could substitute would be Lisinopril for the Losartan and this would cost the patient $4.00.  The patient has tried Lisinopril in the past and it caused him to have a cough.  I spoke with Dr. Shirlee Latch who advised patient shop around for prices - he prefers patient remain on these medications since Lisinopril does not work for him.  I called a [facility] pharmacy to obtain prices for these meds.  The associate said that if the patient's medicaid is active that these Rx would only cost him $3 a piece.  I called patient back to inquire as to whether or not his Medicaid is active.  Patient states that according to his social worker that there is some issue but that he will call her to find out so that he can get this medication issue straightened out.  I advised patient to call back because he needs these meds to control his hypertension.

## 2013-01-12 ENCOUNTER — Encounter: Payer: Self-pay | Admitting: Cardiology

## 2013-01-12 ENCOUNTER — Other Ambulatory Visit: Payer: Self-pay

## 2013-01-19 ENCOUNTER — Ambulatory Visit (INDEPENDENT_AMBULATORY_CARE_PROVIDER_SITE_OTHER): Payer: Self-pay | Admitting: *Deleted

## 2013-01-19 DIAGNOSIS — I4891 Unspecified atrial fibrillation: Secondary | ICD-10-CM

## 2013-01-19 DIAGNOSIS — Z7901 Long term (current) use of anticoagulants: Secondary | ICD-10-CM

## 2013-01-26 ENCOUNTER — Ambulatory Visit (HOSPITAL_COMMUNITY)
Admission: RE | Admit: 2013-01-26 | Discharge: 2013-01-26 | Disposition: A | Discharge status: Home / Self Care | Payer: Self-pay | Source: Ambulatory Visit | Attending: Cardiology | Admitting: Cardiology

## 2013-01-26 DIAGNOSIS — I517 Cardiomegaly: Secondary | ICD-10-CM | POA: Insufficient documentation

## 2013-01-26 DIAGNOSIS — I5022 Chronic systolic (congestive) heart failure: Secondary | ICD-10-CM

## 2013-01-26 MED ORDER — GADOBENATE DIMEGLUMINE 529 MG/ML IV SOLN
30.0000 mL | Freq: Once | INTRAVENOUS | Status: AC
  Administered 2013-01-26: 30 mL via INTRAVENOUS

## 2013-02-04 ENCOUNTER — Encounter: Payer: Self-pay | Admitting: Physician Assistant

## 2013-02-04 ENCOUNTER — Ambulatory Visit (INDEPENDENT_AMBULATORY_CARE_PROVIDER_SITE_OTHER): Payer: Self-pay | Admitting: Physician Assistant

## 2013-02-04 VITALS — BP 172/82 | HR 83 | Ht 72.0 in | Wt 216.2 lb

## 2013-02-04 DIAGNOSIS — I5022 Chronic systolic (congestive) heart failure: Secondary | ICD-10-CM

## 2013-02-04 DIAGNOSIS — E7800 Pure hypercholesterolemia, unspecified: Secondary | ICD-10-CM

## 2013-02-04 DIAGNOSIS — I10 Essential (primary) hypertension: Secondary | ICD-10-CM

## 2013-02-04 DIAGNOSIS — I4891 Unspecified atrial fibrillation: Secondary | ICD-10-CM

## 2013-02-04 DIAGNOSIS — I428 Other cardiomyopathies: Secondary | ICD-10-CM

## 2013-02-04 MED ORDER — LOSARTAN POTASSIUM 100 MG PO TABS: 100.0000 mg | ORAL_TABLET | Freq: Every day | ORAL | Status: DC

## 2013-02-04 MED ORDER — LOVASTATIN 40 MG PO TABS
40.0000 mg | ORAL_TABLET | Freq: Every day | ORAL | Status: DC
Start: 2013-02-04 — End: 2013-05-05

## 2013-02-04 NOTE — Progress Notes (Signed)
[address] [place], [place]  [address] Phone: [phone] Fax:  [phone]  Date:  02/04/2013   ID:  [name] [name], DOB [birth year], MRN [id number]  PCP:  Marca Ancona, MD  Cardiologist:  Dr. Marca Ancona     History of Present Illness: [name] [name] is a 57 y.o. male who returns for f/u.  She has a hx of HTN, DM2, parox AFib, and CAD. He was admitted in 5/13 with chest pain with exertion. LHC was done showing global HK with EF 45% and diffuse, severe distal and branch vessel disease. There was no interventional option, but it was suspected that this disease could be causing his anginal-type pain. Echo at that time was read as showing EF 60-65%. His exertional chest pain seemed to improve considerably with ranolazine.   He ran out of his medications and was admitted in 5/14 with hypertensive urgency and chest pain. He ruled out for MI and BP was controlled. His EF was 50-55% by echo. ETT-Sestamibi done as outpatient showed small fixed inferior defect with no ischemia but EF was 31%. He was quite hypertensive during the study.   Last seen by Dr. Shirlee Latch in 01/06/13. Cardiac MRI was arranged to assess LV function.  Cardiac MRI 12/2012: Mild LVH, EF 44%, normal RV size and function, small areas of subepicardial delayed enhancement of the mid anteroseptal and mid inferoseptal RV insertion sites-nonspecific finding.  He was brought back in follow up today. Blood pressure was elevated last time but no medication adjustments were made.  Patient is not taking most of his medications.  We reviewed these in detail today.  He has been followed sporadically at urgent care for primary care.  He is having difficulty affording his medications.  He has occasional left chest "twinges."  This is a chronic symptom.  He denies exertional chest pain.  No anginal symptoms.  No dyspnea, syncope, orthopnea, PND, edema.  Labs (5/13): K 3.6, creatinine 0.93, LDL 85, HDL 57  Labs (7/13): K  3.9, creatinine 1.1, LDL 57, HDL 51  Labs (5/14): K 4, creatinine 1.25   Wt Readings from Last 3 Encounters:  02/04/13 216 lb 3.2 oz (98.068 kg)  01/06/13 213 lb (96.616 kg)  12/22/12 215 lb (97.523 kg)     Past Medical History:   1. DM2  2. Gout  3. HTN: Cough with ACEI.  4. Nonischemic CMP: EF 35% in 2008. LHC in 2010 showed mild nonobstructive disease. Echo in 5/13 showed EF 60-65% with moderate LVH but EF was 45% on LV-gram in 5/13. Echo (5/14) with EF 50-55%, mild LVH, inferobasal HK, mild MR. ETT-Sestamibi in 5/14, however, showed EF 31%.  Cardiac MRI 12/2012: Mild LVH, EF 44%, normal RV size and function, small areas of subepicardial delayed enhancement of the mid anteroseptal and mid inferoseptal RV insertion sites-nonspecific finding 5. CAD: LHC in 2010 with mild nonobstructive disease. LHC (5/13) with diffuse distal and branch vessel disease, mild global hypokinesis and EF 45%. ETT-Sestamibi (5/14): EF 31%, small fixed inferior defect with no ischemia.  6. H/o chronic HBV  7. Osteochondroma left shoulder.  8. Hyperlipidemia  9. Paroxysmal atrial fibrillation: Coumadin. Developed cough and increased ESR with amiodarone.  10. Atrial flutter: had ablations in 2007 and 2008.    Current Outpatient Prescriptions  Medication Sig Dispense Refill  . allopurinol (ZYLOPRIM) 300 MG tablet Take 300 mg by mouth daily.      [name] amLODipine (NORVASC) 5 MG tablet Take 1 tablet (5 mg total)  by mouth daily.  30 tablet  6  . atorvastatin (LIPITOR) 80 MG tablet Take 1 tablet (80 mg total) by mouth daily.  30 tablet  3  . carvedilol (COREG) 25 MG tablet Take 1 tablet (25 mg total) by mouth 2 (two) times daily with a meal.  60 tablet  0  . colchicine (COLCRYS) 0.6 MG tablet Take 0.6 mg by mouth 3 (three) times daily as needed (Gout flare up).       . furosemide (LASIX) 40 MG tablet Take 1 tablet (40 mg total) by mouth daily.  30 tablet  0  . insulin aspart (NOVOLOG) 100 unit/ml SOLN Inject 7 Units into  the skin 3 (three) times daily with meals. novolog pen      . insulin glargine (LANTUS) 100 UNIT/ML injection Inject 40 Units into the skin at bedtime.      . isosorbide mononitrate (IMDUR) 30 MG 24 hr tablet Take 1 tablet (30 mg total) by mouth daily.  30 tablet  3  . losartan (COZAAR) 100 MG tablet Take 1 tablet (100 mg total) by mouth daily.  30 tablet  6  . metFORMIN (GLUCOPHAGE) 1000 MG tablet Take 1 tablet (1,000 mg total) by mouth 2 (two) times daily.  60 tablet  3  . nitroGLYCERIN (NITROSTAT) 0.4 MG SL tablet Place 1 tablet (0.4 mg total) under the tongue every 5 (five) minutes x 3 doses as needed for chest pain (up to 3 doses).  100 tablet  3  . potassium chloride SA (K-DUR,KLOR-CON) 20 MEQ tablet Take 1 tablet (20 mEq total) by mouth daily.  30 tablet  0  . ranolazine (RANEXA) 500 MG 12 hr tablet Take 1 tablet (500 mg total) by mouth 2 (two) times daily.  60 tablet  0  . warfarin (COUMADIN) 5 MG tablet Take 2.5-5 mg by mouth daily. Take 5 mg (one tablet) on Monday, Wednesday, Friday, Saturday, and Sunday  Take 2.5 mg (1/2 tablet) on Tuesday & Thursday       No current facility-administered medications for this visit.    Allergies:    Allergies  Allergen Reactions  . Other Hives    Patient reports developing hives after receiving "some antibiotic years ago at [facility]". He does not know which antibiotic.    Social History:  The patient  reports that he quit smoking about 29 years ago. He has never used smokeless tobacco. He reports that he drinks about 7.0 ounces of alcohol per week. He reports that he uses illicit drugs (Marijuana).   ROS:  Please see the history of present illness.      All other systems reviewed and negative.   PHYSICAL EXAM: VS:  BP 172/82  Pulse 83  Ht 6' (1.829 m)  Wt 216 lb 3.2 oz (98.068 kg)  BMI 29.32 kg/m2 Well nourished, well developed, in no acute distress HEENT: normal Neck: no JVD Cardiac:  normal S1, S2; RRR; no murmur Lungs:   clear to auscultation bilaterally, no wheezing, rhonchi or rales Abd: soft, nontender, no hepatomegaly Ext: no edema Skin: warm and dry Neuro:  CNs 2-12 intact, no focal abnormalities noted  EKG:  NSR, HR 83, normal axis, LVH, diffuse ST depression in the inferolateral leads, no change from prior tracing     ASSESSMENT AND PLAN:  1. Atrial Fibrillation:  Maintaining NSR.  He is compliant with coumadin.   2. CAD:  No angina off his medications.  He is not taking Imdur, Amlodipine or Ranolazine.  He has not been taking Lipitor. 3. Hyperlipidemia:  Change Lipitor to Lovastatin 40 mg QHS. 4. Non-Ischemic Cardiomyopathy:  He is taking Coreg.  He has a hx of ACE induced cough.  He cannot tolerate this.  I will refill his Losartan 100 mg QD. I have asked him to shop around to find the cheapest pharmacy.  I have asked him to call back if he just cannot afford the Losartan.  He understands that we will accomplish nothing today if he does not take this medication or does not call back to inform he cannot get it.  At that point, we would have to consider hydralazine and nitrates for afterload reduction.  I believe these medicines are on the $4 list. 5. Hypertension:  Restart Losartan as noted.  Check BMET in 2 weeks. 6. Disposition:  F/u with me in 2 weeks.  Restart one medicine at a time.  Will try to start cheapest medicines first.  Refer to PCP at [facility]) or [facility] or [facility].   Signed, Tereso Newcomer, PA-C  02/04/2013 10:50 AM

## 2013-02-04 NOTE — Patient Instructions (Addendum)
Will change your Atorvastatin to Lovastatin 40 mg at bedtime, Rx has been sent to [facility]  Your physician recommends that you schedule a follow-up appointment in: 2 week ov and BMET with Bing Neighbors. PA   Will have [facility] arrange for you to see a primary care physician at [facility] outpatient clinic, or [facility]

## 2013-02-10 ENCOUNTER — Other Ambulatory Visit: Payer: Self-pay | Admitting: *Deleted

## 2013-02-10 ENCOUNTER — Other Ambulatory Visit: Payer: Self-pay

## 2013-02-10 ENCOUNTER — Telehealth: Payer: Self-pay

## 2013-02-10 DIAGNOSIS — I10 Essential (primary) hypertension: Secondary | ICD-10-CM

## 2013-02-10 NOTE — Telephone Encounter (Signed)
Pt daughter would like a call back from Dr. Shirlee Latch nurse about a medication that was stopped due to cost but stated he has taken his last pill today and still been taking the medication. Ranexa

## 2013-02-11 NOTE — Telephone Encounter (Signed)
Do you know her name and phone number. I do not see it listed.

## 2013-02-16 ENCOUNTER — Telehealth: Payer: Self-pay

## 2013-02-16 NOTE — Telephone Encounter (Signed)
[phone] (H) [phone] (M)

## 2013-02-19 ENCOUNTER — Ambulatory Visit (INDEPENDENT_AMBULATORY_CARE_PROVIDER_SITE_OTHER): Payer: Medicaid Other | Admitting: Physician Assistant

## 2013-02-19 ENCOUNTER — Ambulatory Visit: Payer: Self-pay | Attending: Family Medicine | Admitting: Internal Medicine

## 2013-02-19 ENCOUNTER — Other Ambulatory Visit: Payer: Medicaid Other

## 2013-02-19 ENCOUNTER — Encounter: Payer: Self-pay | Admitting: Physician Assistant

## 2013-02-19 VITALS — BP 173/103 | HR 72 | Ht 72.0 in | Wt 213.0 lb

## 2013-02-19 VITALS — BP 192/98 | HR 74 | Temp 98.0°F | Resp 16 | Ht 72.0 in | Wt 213.8 lb

## 2013-02-19 DIAGNOSIS — I4891 Unspecified atrial fibrillation: Secondary | ICD-10-CM

## 2013-02-19 DIAGNOSIS — I10 Essential (primary) hypertension: Secondary | ICD-10-CM | POA: Insufficient documentation

## 2013-02-19 DIAGNOSIS — F1010 Alcohol abuse, uncomplicated: Secondary | ICD-10-CM

## 2013-02-19 DIAGNOSIS — E7800 Pure hypercholesterolemia, unspecified: Secondary | ICD-10-CM

## 2013-02-19 DIAGNOSIS — I5022 Chronic systolic (congestive) heart failure: Secondary | ICD-10-CM

## 2013-02-19 DIAGNOSIS — E119 Type 2 diabetes mellitus without complications: Secondary | ICD-10-CM | POA: Insufficient documentation

## 2013-02-19 LAB — BASIC METABOLIC PANEL
CO2: 30 mEq/L (ref 19–32)
Chloride: 102 mEq/L (ref 96–112)
Sodium: 132 mEq/L — ABNORMAL LOW (ref 135–145)

## 2013-02-19 MED ORDER — ISOSORBIDE MONONITRATE ER 30 MG PO TB24: 30.0000 mg | ORAL_TABLET | Freq: Every day | ORAL | Status: DC

## 2013-02-19 MED ORDER — HYDRALAZINE HCL 25 MG PO TABS: 25.0000 mg | ORAL_TABLET | Freq: Three times a day (TID) | ORAL | Status: DC

## 2013-02-19 NOTE — Patient Instructions (Signed)
Low carbohydrate heart healthy diet. 1.5 L fluid restriction daily.  Accuchecks 4 times/day, Once in AM empty stomach and then before each meal. Log in all results and show them to your Prim.MD in 3 days. If any glucose reading is under 80 or above 300 call your Prim MD immidiately. Follow Low glucose instructions for glucose under 80 as instructed.

## 2013-02-19 NOTE — Progress Notes (Signed)
[address] [place], [place]  [address] Phone: [phone] Fax:  [phone]  Date:  02/19/2013   ID:  [name] [name], DOB 1955/10/16, MRN [id number]  PCP:  No primary provider on file.  Cardiologist:  Dr. Marca Ancona     History of Present Illness: [name] [name] is a 57 y.o. male who returns for f/u.  He has a hx of HTN, DM2, parox AFib, and CAD. He was admitted in 5/13 with chest pain with exertion. LHC was done showing global HK with EF 45% and diffuse, severe distal and branch vessel disease. There was no interventional option, but it was suspected that this disease could be causing his anginal-type pain. Echo at that time was read as showing EF 60-65%. His exertional chest pain seemed to improve considerably with ranolazine.   He ran out of his medications and was admitted in 5/14 with hypertensive urgency and chest pain. He ruled out for MI and BP was controlled. His EF was 50-55% by echo. ETT-Sestamibi done as outpatient showed small fixed inferior defect with no ischemia but EF was 31%. He was quite hypertensive during the study.   Last seen by Dr. Shirlee Latch in 01/06/13. Cardiac MRI was arranged to assess LV function.  Cardiac MRI 12/2012: Mild LVH, EF 44%, normal RV size and function, small areas of subepicardial delayed enhancement of the mid anteroseptal and mid inferoseptal RV insertion sites-nonspecific finding.    I saw him in f/u 02/04/13.  He was not taking most of his medications (Imdur, Amlodipine, Ranolazine, Lipitor).  I kept him on Losartan and Coreg.  We had a long talk about the importance of his medications.  I also switched Lipitor to Mevacor for reduced cost.  Plan is to restart one medication at a time.   I am not sure if he is taking his medications.  He told me at first entry to the room that he was taking Allopurinol.  He then told me that he was taking everything on his list (as I pointed to them).  He "thinks" he is taking Losartan. He has  an occasional CP.  It is sharp and may happen 2x a day.  It is not clear if this occurs with exertion.  No assoc symptoms aside from left arm pain.  No syncope.  No orthopnea, PND, edema.   Labs (5/13): K 3.6, creatinine 0.93, LDL 85, HDL 57  Labs (7/13): K 3.9, creatinine 1.1, LDL 57, HDL 51  Labs (5/14): K 4, creatinine 1.25   Wt Readings from Last 3 Encounters:  02/04/13 216 lb 3.2 oz (98.068 kg)  01/06/13 213 lb (96.616 kg)  12/22/12 215 lb (97.523 kg)     Past Medical History:   1. DM2  2. Gout  3. HTN: Cough with ACEI.  4. Nonischemic CMP: EF 35% in 2008. LHC in 2010 showed mild nonobstructive disease. Echo in 5/13 showed EF 60-65% with moderate LVH but EF was 45% on LV-gram in 5/13. Echo (5/14) with EF 50-55%, mild LVH, inferobasal HK, mild MR. ETT-Sestamibi in 5/14, however, showed EF 31%.  Cardiac MRI 12/2012: Mild LVH, EF 44%, normal RV size and function, small areas of subepicardial delayed enhancement of the mid anteroseptal and mid inferoseptal RV insertion sites-nonspecific finding 5. CAD: LHC in 2010 with mild nonobstructive disease. LHC (5/13) with diffuse distal and branch vessel disease, mild global hypokinesis and EF 45%. ETT-Sestamibi (5/14): EF 31%, small fixed inferior defect with no ischemia.  6. H/o chronic HBV  7. Osteochondroma left shoulder.  8. Hyperlipidemia  9. Paroxysmal atrial fibrillation: Coumadin. Developed cough and increased ESR with amiodarone.  10. Atrial flutter: had ablations in 2007 and 2008.    Current Outpatient Prescriptions  Medication Sig Dispense Refill  . carvedilol (COREG) 25 MG tablet Take 1 tablet (25 mg total) by mouth 2 (two) times daily with a meal.  60 tablet  0  . colchicine (COLCRYS) 0.6 MG tablet Take 0.6 mg by mouth 3 (three) times daily as needed (Gout flare up).       . furosemide (LASIX) 40 MG tablet Take 1 tablet (40 mg total) by mouth daily.  30 tablet  0  . insulin aspart (NOVOLOG) 100 unit/ml SOLN Inject 7 Units into the  skin 3 (three) times daily with meals. novolog pen      . insulin glargine (LANTUS) 100 UNIT/ML injection Inject 40 Units into the skin at bedtime.      [name] losartan (COZAAR) 100 MG tablet Take 1 tablet (100 mg total) by mouth daily.  30 tablet  6  . lovastatin (MEVACOR) 40 MG tablet Take 1 tablet (40 mg total) by mouth at bedtime.  30 tablet  5  . metFORMIN (GLUCOPHAGE) 1000 MG tablet Take 1 tablet (1,000 mg total) by mouth 2 (two) times daily.  60 tablet  3  . nitroGLYCERIN (NITROSTAT) 0.4 MG SL tablet Place 1 tablet (0.4 mg total) under the tongue every 5 (five) minutes x 3 doses as needed for chest pain (up to 3 doses).  100 tablet  3  . potassium chloride SA (K-DUR,KLOR-CON) 20 MEQ tablet Take 1 tablet (20 mEq total) by mouth daily.  30 tablet  0  . warfarin (COUMADIN) 5 MG tablet Take 2.5-5 mg by mouth daily. Take 5 mg (one tablet) on Monday, Wednesday, Friday, Saturday, and Sunday  Take 2.5 mg (1/2 tablet) on Tuesday & Thursday      . allopurinol (ZYLOPRIM) 100 MG tablet Take 100 mg by mouth daily.      . hydrALAZINE (APRESOLINE) 25 MG tablet Take 1 tablet (25 mg total) by mouth 3 (three) times daily.  270 tablet  3  . isosorbide mononitrate (IMDUR) 30 MG 24 hr tablet Take 1 tablet (30 mg total) by mouth daily.  90 tablet  3   No current facility-administered medications for this visit.    Allergies:    Allergies  Allergen Reactions  . Other Hives    Patient reports developing hives after receiving "some antibiotic years ago at [facility]". He does not know which antibiotic.    Social History:  The patient  reports that he quit smoking about 29 years ago. He has never used smokeless tobacco. He reports that he drinks about 7.0 ounces of alcohol per week. He reports that he uses illicit drugs (Marijuana).   ROS:  Please see the history of present illness.     All other systems reviewed and negative.   PHYSICAL EXAM: VS:  BP 173/103  Pulse 72  Ht 6' (1.829 m)  Wt 213 lb  (96.616 kg)  BMI 28.88 kg/m2 Well nourished, well developed, in no acute distress HEENT: normal Neck: no JVD Cardiac:  normal S1, S2; RRR; no murmur Lungs:  clear to auscultation bilaterally, no wheezing, rhonchi or rales Abd: soft, nontender, no hepatomegaly Ext: no edema Skin: warm and dry Neuro:  CNs 2-12 intact, no focal abnormalities noted  EKG:  NSR, HR 72, LVH with diffuse ST changes, no change from  prior tracing    ASSESSMENT AND PLAN:  1. Atrial Fibrillation:  Maintaining NSR.  He is compliant with coumadin.   2. CAD:  He is having some CP.  ? If this is angina.  He has a hx of small vessel dsz not amenable to PCI.  BP still uncontrolled.  Add Isosorbide 30 QD back to his regimen.   3. Hyperlipidemia:  Now on Lovastatin 40 mg QHS.  Check Lipids and LFTs in 6 weeks.   4. Non-Ischemic Cardiomyopathy:  Adjust medications as noted below. 5. Hypertension:  Uncontrolled.  Check BMET today.  He has not taken any medications yet today.  I explained to him the importance of this.  He will call me if he is NOT taking Losartan.  I will start Hydralazine 25 mg TID + Isosorbide 30 mg QD. He does not take PDE-5 inhibitors.   6. ETOH Abuse:  I advised him to quit.  7. Disposition:  F/u with me in 2 weeks.    Signed, Tereso Newcomer, PA-C  02/19/2013 2:31 PM

## 2013-02-19 NOTE — Patient Instructions (Addendum)
START HYDRALAZINE 25 MG 3 TIMES DAILY RX SENT TO PHARMACY  START IMDUR 30 MG DAILY RX SENT TO PHARMACY  CALL us IF YOU ARE NOT TAKING LOSARTAN [phone]  LAB TODAY, BMET  FOLLOW UP WITH SCOTT WEAVER, PA-C  IN 2-3 WEEKS

## 2013-02-19 NOTE — Progress Notes (Signed)
Pt here to establish new pt care for Diabetes, HTN. Pt was seen today at [facility], and told to come here for PCP

## 2013-02-19 NOTE — Progress Notes (Signed)
Patient ID: [name] [name], male   DOB: [birth year], 57 y.o.   MRN: [id number]  Patient Demographics  [name] [name], is a 57 y.o. male  WUJ:[id number]  [id number]  DOB - 11-09-1955  Chief Complaint  Patient presents with  . Establish Care    diabetes, hypertension        Subjective:   [name] [name] with History of chronic systolic heart failure EF 20-25% follows with Dr. Shirlee Latch cardiologist, that is mellitus type II in poor control, hypertension in poor control, it would fibrillation on Coumadin follows his Coumadin dose and INR checks with [facility], he  is here for routine followup visit has no subjective complaints.  Denies any subjective complaints except as above, no active headache, no chest abdominal pain at this time, not short of breath. No focal weakness which is new.   Objective:    Patient Active Problem List   Diagnosis Date Noted  . Intermediate coronary syndrome 12/15/2012  . Encounter for long-term (current) use of anticoagulants 04/16/2012  . CHF (congestive heart failure) 02/11/2012  . Coronary artery disease 12/28/2011  . Atrial fibrillation 12/28/2011  . Chest pain 12/18/2011  . History of cardiac arrhythmia 12/18/2011  . Chronic systolic CHF (congestive heart failure) 12/18/2011  . Rectal bleeding 12/18/2011  . CANDIDIASIS 09/27/2010  . HYPERCHOLESTEROLEMIA 07/11/2010  . DENTAL CARIES 06/30/2010  . GOUT 05/05/2009  . HYPERTENSION, BENIGN ESSENTIAL 04/07/2009  . DIABETES MELLITUS, TYPE II 03/06/2009  . HEPATITIS B, CHRONIC 12/30/2006  . OSTEOCHONDROMA 12/30/2006  . MITRAL REGURGITATION 12/30/2006  . LIVER FUNCTION TESTS, ABNORMAL, HX OF 12/30/2006  . COLONIC POLYPS, HX OF 12/30/2006  . COLONOSCOPY, HX OF 05/12/2001     Filed Vitals:   02/19/13 1607  BP: 192/98  Pulse: 74  Temp: 98 F (36.7 C)  TempSrc: Oral  Resp: 16  Height: 6' (1.829 m)  Weight: 213 lb 12.8 oz (96.979 kg)  SpO2: 98%     Exam  Awake Alert, Oriented X 3,  No new F.N deficits, Normal affect [place].AT,PERRAL Supple Neck,No JVD, No cervical lymphadenopathy appriciated.  Symmetrical Chest wall movement, Good air movement bilaterally, CTAB iRRR,No Gallops,Rubs or new Murmurs, No Parasternal Heave +ve B.Sounds, Abd Soft, Non tender, No organomegaly appriciated, No rebound - guarding or rigidity. No Cyanosis, Clubbing or edema, No new Rash or bruise      Data Review   CBC No results found for this basename: WBC, HGB, HCT, PLT, MCV, MCH, MCHC, RDW, NEUTRABS, LYMPHSABS, MONOABS, EOSABS, BASOSABS, BANDABS, BANDSABD,  in the last 168 hours  Chemistries   No results found for this basename: NA, K, CL, CO2, GLUCOSE, BUN, CREATININE, GFRCGP, CALCIUM, MG, AST, ALT, ALKPHOS, BILITOT,  in the last 168 hours ------------------------------------------------------------------------------------------------------------------ No results found for this basename: HGBA1C,  in the last 72 hours ------------------------------------------------------------------------------------------------------------------ No results found for this basename: CHOL, HDL, LDLCALC, TRIG, CHOLHDL, LDLDIRECT,  in the last 72 hours ------------------------------------------------------------------------------------------------------------------ No results found for this basename: TSH, T4TOTAL, FREET3, T3FREE, THYROIDAB,  in the last 72 hours ------------------------------------------------------------------------------------------------------------------ No results found for this basename: VITAMINB12, FOLATE, FERRITIN, TIBC, IRON, RETICCTPCT,  in the last 72 hours  Coagulation profile  No results found for this basename: INR, PROTIME,  in the last 168 hours     Prior to Admission medications   Medication Sig Start Date End Date Taking? Authorizing Provider  allopurinol (ZYLOPRIM) 100 MG tablet Take 100 mg by mouth daily.    Historical Provider, MD  carvedilol (COREG) 25 MG tablet  Take 1 tablet (  25 mg total) by mouth 2 (two) times daily with a meal. 12/02/12   Nishant Dhungel, MD  colchicine (COLCRYS) 0.6 MG tablet Take 0.6 mg by mouth 3 (three) times daily as needed (Gout flare up).     Historical Provider, MD  furosemide (LASIX) 40 MG tablet Take 1 tablet (40 mg total) by mouth daily. 12/02/12   Nishant Dhungel, MD  hydrALAZINE (APRESOLINE) 25 MG tablet Take 1 tablet (25 mg total) by mouth 3 (three) times daily. 02/19/13   Beatrice Lecher, PA-C  insulin aspart (NOVOLOG) 100 unit/ml SOLN Inject 7 Units into the skin 3 (three) times daily with meals. novolog pen    Historical Provider, MD  insulin glargine (LANTUS) 100 UNIT/ML injection Inject 40 Units into the skin at bedtime.    Historical Provider, MD  isosorbide mononitrate (IMDUR) 30 MG 24 hr tablet Take 1 tablet (30 mg total) by mouth daily. 02/19/13   Beatrice Lecher, PA-C  losartan (COZAAR) 100 MG tablet Take 1 tablet (100 mg total) by mouth daily. 02/04/13   Beatrice Lecher, PA-C  lovastatin (MEVACOR) 40 MG tablet Take 1 tablet (40 mg total) by mouth at bedtime. 02/04/13   Beatrice Lecher, PA-C  metFORMIN (GLUCOPHAGE) 1000 MG tablet Take 1 tablet (1,000 mg total) by mouth 2 (two) times daily. 12/02/12   Nishant Dhungel, MD  nitroGLYCERIN (NITROSTAT) 0.4 MG SL tablet Place 1 tablet (0.4 mg total) under the tongue every 5 (five) minutes x 3 doses as needed for chest pain (up to 3 doses). 08/22/12 08/22/13  [name], MD  potassium chloride SA (K-DUR,KLOR-CON) 20 MEQ tablet Take 1 tablet (20 mEq total) by mouth daily. 11/01/12   Doug Sou, MD  warfarin (COUMADIN) 5 MG tablet Take 2.5-5 mg by mouth daily. Take 5 mg (one tablet) on Monday, Wednesday, Friday, Saturday, and Sunday  Take 2.5 mg (1/2 tablet) on Tuesday & Thursday    Historical Provider, MD     Assessment & Plan   Chronic atrial fibrillation on Coumadin. Continue beta blocker and Coumadin, follows Coumadin dose and INR checks at [facility] and had his  INR checked today.   Hypertension in poor control. He saw [facility] today and was placed on Imdur along with hydralazine, we'll get him back here in a week to check his blood pressure.   Chronic systolic heart failure EF 20-25%. Continue on beta blocker and ARB, clinically compensated, written instructions given on salt and fluid restriction, follows with cardiologist Dr. Shirlee Latch on a close basis the   DM II with poor control will check A1c, given him instructions to do Accu-Cheks q. a.c. at bedtime maintain a logbook and bring the results next week upon his visit.   History of alcohol intake. Tonsil to quit alcohol.   Dyslipidemia continue home dose statin     Routine health maintenance.   Screening labs. Routine labs noted recent lab draw, A1c was elevated, repeat A1c and BMP today. He will come back in a week.   Colonoscopy had 8 years ago per patient was unremarkable.   Immunizations be out of tetanus shot will be given next visit       Leroy Sea M.D on 02/19/2013 at 4:15 PM

## 2013-02-20 LAB — BASIC METABOLIC PANEL
BUN: 11 mg/dL (ref 6–23)
CO2: 28 mEq/L (ref 19–32)
Chloride: 104 mEq/L (ref 96–112)
Glucose, Bld: 187 mg/dL — ABNORMAL HIGH (ref 70–99)
Potassium: 4.4 mEq/L (ref 3.5–5.3)

## 2013-02-26 ENCOUNTER — Ambulatory Visit: Payer: Medicaid Other

## 2013-03-09 ENCOUNTER — Other Ambulatory Visit: Payer: Self-pay

## 2013-03-11 ENCOUNTER — Ambulatory Visit: Payer: Medicaid Other | Admitting: Physician Assistant

## 2013-03-11 ENCOUNTER — Other Ambulatory Visit: Payer: Self-pay

## 2013-03-30 ENCOUNTER — Encounter: Payer: Self-pay | Admitting: Physician Assistant

## 2013-03-30 ENCOUNTER — Ambulatory Visit (INDEPENDENT_AMBULATORY_CARE_PROVIDER_SITE_OTHER): Payer: Self-pay | Admitting: Physician Assistant

## 2013-03-30 ENCOUNTER — Ambulatory Visit (INDEPENDENT_AMBULATORY_CARE_PROVIDER_SITE_OTHER): Payer: Self-pay | Admitting: Pharmacist

## 2013-03-30 ENCOUNTER — Other Ambulatory Visit: Payer: Self-pay

## 2013-03-30 VITALS — BP 160/90 | HR 75 | Ht 72.0 in | Wt 215.0 lb

## 2013-03-30 DIAGNOSIS — Z7901 Long term (current) use of anticoagulants: Secondary | ICD-10-CM

## 2013-03-30 DIAGNOSIS — I4891 Unspecified atrial fibrillation: Secondary | ICD-10-CM

## 2013-03-30 DIAGNOSIS — F1010 Alcohol abuse, uncomplicated: Secondary | ICD-10-CM

## 2013-03-30 DIAGNOSIS — I2510 Atherosclerotic heart disease of native coronary artery without angina pectoris: Secondary | ICD-10-CM

## 2013-03-30 DIAGNOSIS — I5022 Chronic systolic (congestive) heart failure: Secondary | ICD-10-CM

## 2013-03-30 DIAGNOSIS — I428 Other cardiomyopathies: Secondary | ICD-10-CM

## 2013-03-30 DIAGNOSIS — I10 Essential (primary) hypertension: Secondary | ICD-10-CM

## 2013-03-30 DIAGNOSIS — E7800 Pure hypercholesterolemia, unspecified: Secondary | ICD-10-CM

## 2013-03-30 MED ORDER — CARVEDILOL 25 MG PO TABS: 25.0000 mg | ORAL_TABLET | Freq: Two times a day (BID) | ORAL | Status: DC

## 2013-03-30 MED ORDER — ISOSORBIDE MONONITRATE ER 30 MG PO TB24: 30.0000 mg | ORAL_TABLET | Freq: Every day | ORAL | Status: DC

## 2013-03-30 MED ORDER — HYDRALAZINE HCL 25 MG PO TABS: 25.0000 mg | ORAL_TABLET | Freq: Three times a day (TID) | ORAL | Status: DC

## 2013-03-30 NOTE — Progress Notes (Signed)
[address], [address] [place], [place]  [address] Phone: [phone] Fax:  [phone]  Date:  03/30/2013   ID:  [name] [name], DOB 14-Nov-1955, MRN [id number]  PCP:  Standley Dakins, MD  Cardiologist:  Dr. Marca Ancona     History of Present Illness: [name] [name] is a 57 y.o. male who returns for f/u.  He has a hx of HTN, DM2, parox AFib, and CAD. He was admitted in 5/13 with chest pain with exertion. LHC was done showing global HK with EF 45% and diffuse, severe distal and branch vessel disease. There was no interventional option, but it was suspected that this disease could be causing his anginal-type pain. Echo at that time was read as showing EF 60-65%. His exertional chest pain seemed to improve considerably with ranolazine in the past.   He ran out of his medications and was admitted in 5/14 with hypertensive urgency and chest pain. He ruled out for MI and BP was controlled. His EF was 50-55% by echo. ETT-Sestamibi done as outpatient showed small fixed inferior defect with no ischemia but EF was 31%. He was quite hypertensive during the study.   Cardiac MRI 12/2012: Mild LVH, EF 44%, normal RV size and function, small areas of subepicardial delayed enhancement of the mid anteroseptal and mid inferoseptal RV insertion sites-nonspecific finding.    I saw him in f/u 02/04/13.  He was not taking most of his medications (Imdur, Amlodipine, Ranolazine, Lipitor).  I kept him on Losartan and Coreg.  We had a long talk about the importance of his medications.  I also switched Lipitor to Mevacor for reduced cost.  Plan has been to restart one medication at a time.   I saw him last 02/19/13. It was not clear if he was taking all his medications at that time. Isosorbide was added back to his medical regimen as well as hydralazine.  He continues to have atypical CP.   No exertional CP.  Denies significant dyspnea, orthopnea, PND, edema.  No syncope.  He has not taken Imdur in a week.   He never started hydralazine.  Took last dose of Coreg this AM.    Labs (5/13): K 3.6, creatinine 0.93, LDL 85, HDL 57  Labs (7/13): K 3.9, creatinine 1.1, LDL 57, HDL 51  Labs (5/14): K 4, creatinine 1.25  Labs (7/14): K 4.4, Cr 1.10   Wt Readings from Last 3 Encounters:  03/30/13 215 lb (97.523 kg)  02/19/13 213 lb 12.8 oz (96.979 kg)  02/19/13 213 lb (96.616 kg)     Past Medical History:   1. DM2  2. Gout  3. HTN: Cough with ACEI.  4. Nonischemic CMP: EF 35% in 2008. LHC in 2010 showed mild nonobstructive disease. Echo in 5/13 showed EF 60-65% with moderate LVH but EF was 45% on LV-gram in 5/13. Echo (5/14) with EF 50-55%, mild LVH, inferobasal HK, mild MR. ETT-Sestamibi in 5/14, however, showed EF 31%.  Cardiac MRI 12/2012: Mild LVH, EF 44%, normal RV size and function, small areas of subepicardial delayed enhancement of the mid anteroseptal and mid inferoseptal RV insertion sites-nonspecific finding 5. CAD: LHC in 2010 with mild nonobstructive disease. LHC (5/13) with diffuse distal and branch vessel disease, mild global hypokinesis and EF 45%. ETT-Sestamibi (5/14): EF 31%, small fixed inferior defect with no ischemia.  6. H/o chronic HBV  7. Osteochondroma left shoulder.  8. Hyperlipidemia  9. Paroxysmal atrial fibrillation: Coumadin. Developed cough and increased ESR with amiodarone.  10.  Atrial flutter: had ablations in 2007 and 2008.    Current Outpatient Prescriptions  Medication Sig Dispense Refill  . allopurinol (ZYLOPRIM) 100 MG tablet Take 100 mg by mouth daily.      . carvedilol (COREG) 25 MG tablet Take 1 tablet (25 mg total) by mouth 2 (two) times daily with a meal.  60 tablet  0  . colchicine (COLCRYS) 0.6 MG tablet Take 0.6 mg by mouth 3 (three) times daily as needed (Gout flare up).       . furosemide (LASIX) 40 MG tablet Take 1 tablet (40 mg total) by mouth daily.  30 tablet  0  . insulin aspart (NOVOLOG) 100 unit/ml SOLN Inject 7 Units into the skin 3 (three)  times daily with meals. novolog pen      . insulin glargine (LANTUS) 100 UNIT/ML injection Inject 40 Units into the skin at bedtime.      . isosorbide mononitrate (IMDUR) 30 MG 24 hr tablet Take 1 tablet (30 mg total) by mouth daily.  90 tablet  3  . losartan (COZAAR) 100 MG tablet Take 1 tablet (100 mg total) by mouth daily.  30 tablet  6  . lovastatin (MEVACOR) 40 MG tablet Take 1 tablet (40 mg total) by mouth at bedtime.  30 tablet  5  . metFORMIN (GLUCOPHAGE) 1000 MG tablet Take 1 tablet (1,000 mg total) by mouth 2 (two) times daily.  60 tablet  3  . nitroGLYCERIN (NITROSTAT) 0.4 MG SL tablet Place 1 tablet (0.4 mg total) under the tongue every 5 (five) minutes x 3 doses as needed for chest pain (up to 3 doses).  100 tablet  3  . potassium chloride SA (K-DUR,KLOR-CON) 20 MEQ tablet Take 1 tablet (20 mEq total) by mouth daily.  30 tablet  0  . warfarin (COUMADIN) 5 MG tablet Take 2.5-5 mg by mouth daily. Take 5 mg (one tablet) on Monday, Wednesday, Friday, Saturday, and Sunday  Take 2.5 mg (1/2 tablet) on Tuesday & Thursday      . hydrALAZINE (APRESOLINE) 25 MG tablet Take 1 tablet (25 mg total) by mouth 3 (three) times daily.  270 tablet  3   No current facility-administered medications for this visit.    Allergies:    Allergies  Allergen Reactions  . Other Hives    Patient reports developing hives after receiving "some antibiotic years ago at [facility]". He does not know which antibiotic.    Social History:  The patient  reports that he quit smoking about 29 years ago. He has never used smokeless tobacco. He reports that he drinks about 7.0 ounces of alcohol per week. He reports that he uses illicit drugs (Marijuana).   ROS:  Please see the history of present illness.     All other systems reviewed and negative.   PHYSICAL EXAM: VS:  BP 160/90  Pulse 75  Ht 6' (1.829 m)  Wt 215 lb (97.523 kg)  BMI 29.15 kg/m2 Well nourished, well developed, in no acute distress HEENT:  normal Neck: no JVD Cardiac:  normal S1, S2; RRR; no murmur Lungs:  clear to auscultation bilaterally, no wheezing, rhonchi or rales Abd: soft, nontender, no hepatomegaly Ext: no edema Skin: warm and dry Neuro:  CNs 2-12 intact, no focal abnormalities noted  EKG:  NSR, HR 75, LVH with repol abnormality, no change from prior tracing.    ASSESSMENT AND PLAN:  1. Atrial Fibrillation:  Maintaining NSR.  He is compliant with coumadin.  2. CAD:  Continue statin.    3. Hyperlipidemia:  Now on Lovastatin 40 mg QHS.  Arrange fasting Lipids and LFTs.   4. Non-Ischemic Cardiomyopathy:  Continue beta blocker, ARB, hydralazine and nitrates. 5. Hypertension:  Uncontrolled.  We discussed the importance of taking his medications again.  Will refill coreg, Imdur and hydralazine.  I have asked him (again) to notify us if he is not taking anything on his list.   6. ETOH Abuse:  We discussed the importance of cessation (again).  7. Disposition:  F/u with me in 3 mos and Dr. Marca Ancona in 6 mos.    Signed, Tereso Newcomer, PA-C  03/30/2013 9:13 AM

## 2013-03-30 NOTE — Patient Instructions (Addendum)
A REFILL WAS SENT IN FOR THE COREG, IMDUR, HYDRALAZINE  PLEASE RESCHEDULE TO HAVE FASTING LIPID AND LIVER PANEL   PLEASE FOLLOW UP WITH SCOTT WEAVER, PAC IN 3 MNTHS  PLEASE FOLLOW UP WITH DR. Shirlee Latch IN 6 MONTHS

## 2013-04-01 ENCOUNTER — Other Ambulatory Visit (INDEPENDENT_AMBULATORY_CARE_PROVIDER_SITE_OTHER): Payer: Self-pay

## 2013-04-01 DIAGNOSIS — I5022 Chronic systolic (congestive) heart failure: Secondary | ICD-10-CM

## 2013-04-01 LAB — LIPID PANEL
LDL Cholesterol: 103 mg/dL — ABNORMAL HIGH (ref 0–99)
VLDL: 33.4 mg/dL (ref 0.0–40.0)

## 2013-04-01 LAB — HEPATIC FUNCTION PANEL
Bilirubin, Direct: 0.1 mg/dL (ref 0.0–0.3)
Total Bilirubin: 0.5 mg/dL (ref 0.3–1.2)
Total Protein: 7.6 g/dL (ref 6.0–8.3)

## 2013-04-06 ENCOUNTER — Emergency Department (HOSPITAL_COMMUNITY): Payer: Self-pay

## 2013-04-06 ENCOUNTER — Emergency Department (HOSPITAL_COMMUNITY)
Admission: EM | Admit: 2013-04-06 | Discharge: 2013-04-06 | Disposition: A | Discharge status: Home / Self Care | Payer: Self-pay | Attending: Emergency Medicine | Admitting: Emergency Medicine

## 2013-04-06 ENCOUNTER — Encounter (HOSPITAL_COMMUNITY): Payer: Self-pay | Admitting: Emergency Medicine

## 2013-04-06 DIAGNOSIS — Z794 Long term (current) use of insulin: Secondary | ICD-10-CM | POA: Insufficient documentation

## 2013-04-06 DIAGNOSIS — M109 Gout, unspecified: Secondary | ICD-10-CM | POA: Insufficient documentation

## 2013-04-06 DIAGNOSIS — E7800 Pure hypercholesterolemia, unspecified: Secondary | ICD-10-CM | POA: Insufficient documentation

## 2013-04-06 DIAGNOSIS — Z8639 Personal history of other endocrine, nutritional and metabolic disease: Secondary | ICD-10-CM | POA: Insufficient documentation

## 2013-04-06 DIAGNOSIS — I509 Heart failure, unspecified: Secondary | ICD-10-CM | POA: Insufficient documentation

## 2013-04-06 DIAGNOSIS — R071 Chest pain on breathing: Secondary | ICD-10-CM | POA: Insufficient documentation

## 2013-04-06 DIAGNOSIS — E119 Type 2 diabetes mellitus without complications: Secondary | ICD-10-CM | POA: Insufficient documentation

## 2013-04-06 DIAGNOSIS — Z860100 Personal history of colon polyps, unspecified: Secondary | ICD-10-CM | POA: Insufficient documentation

## 2013-04-06 DIAGNOSIS — Z8739 Personal history of other diseases of the musculoskeletal system and connective tissue: Secondary | ICD-10-CM | POA: Insufficient documentation

## 2013-04-06 DIAGNOSIS — R0789 Other chest pain: Secondary | ICD-10-CM

## 2013-04-06 DIAGNOSIS — Z8679 Personal history of other diseases of the circulatory system: Secondary | ICD-10-CM | POA: Insufficient documentation

## 2013-04-06 DIAGNOSIS — R011 Cardiac murmur, unspecified: Secondary | ICD-10-CM | POA: Insufficient documentation

## 2013-04-06 DIAGNOSIS — I2510 Atherosclerotic heart disease of native coronary artery without angina pectoris: Secondary | ICD-10-CM | POA: Insufficient documentation

## 2013-04-06 DIAGNOSIS — I10 Essential (primary) hypertension: Secondary | ICD-10-CM | POA: Insufficient documentation

## 2013-04-06 DIAGNOSIS — Z862 Personal history of diseases of the blood and blood-forming organs and certain disorders involving the immune mechanism: Secondary | ICD-10-CM | POA: Insufficient documentation

## 2013-04-06 DIAGNOSIS — I4891 Unspecified atrial fibrillation: Secondary | ICD-10-CM | POA: Insufficient documentation

## 2013-04-06 DIAGNOSIS — Z8719 Personal history of other diseases of the digestive system: Secondary | ICD-10-CM | POA: Insufficient documentation

## 2013-04-06 DIAGNOSIS — Z7901 Long term (current) use of anticoagulants: Secondary | ICD-10-CM | POA: Insufficient documentation

## 2013-04-06 DIAGNOSIS — Z8619 Personal history of other infectious and parasitic diseases: Secondary | ICD-10-CM | POA: Insufficient documentation

## 2013-04-06 DIAGNOSIS — Z9861 Coronary angioplasty status: Secondary | ICD-10-CM | POA: Insufficient documentation

## 2013-04-06 DIAGNOSIS — Z79899 Other long term (current) drug therapy: Secondary | ICD-10-CM | POA: Insufficient documentation

## 2013-04-06 DIAGNOSIS — Z87891 Personal history of nicotine dependence: Secondary | ICD-10-CM | POA: Insufficient documentation

## 2013-04-06 LAB — COMPREHENSIVE METABOLIC PANEL
ALT: 12 U/L (ref 0–53)
Alkaline Phosphatase: 52 U/L (ref 39–117)
CO2: 23 mEq/L (ref 19–32)
Calcium: 9.1 mg/dL (ref 8.4–10.5)
GFR calc Af Amer: >90 mL/min (ref >90)
GFR calc non Af Amer: 90 mL/min — ABNORMAL LOW (ref >90)
Glucose, Bld: 160 mg/dL — ABNORMAL HIGH (ref 70–99)
Sodium: 138 mEq/L (ref 135–145)
Total Bilirubin: 0.6 mg/dL (ref 0.3–1.2)

## 2013-04-06 LAB — POCT I-STAT TROPONIN I
Troponin i, poc: 0 ng/mL (ref 0.00–0.08)
Troponin i, poc: 0.03 ng/mL (ref 0.00–0.08)

## 2013-04-06 LAB — CBC
Hemoglobin: 14.6 g/dL (ref 13.0–17.0)
MCH: 28.4 pg (ref 26.0–34.0)
MCV: 81.1 fL (ref 78.0–100.0)
RBC: 5.14 MIL/uL (ref 4.22–5.81)

## 2013-04-06 LAB — PROTIME-INR
INR: 1.97 — ABNORMAL HIGH (ref 0.00–1.49)
Prothrombin Time: 21.8 s — ABNORMAL HIGH (ref 11.6–15.2)

## 2013-04-06 NOTE — ED Notes (Signed)
Rt sided cp that started when he woke up some sob no nausea denies cough

## 2013-04-06 NOTE — ED Provider Notes (Signed)
CSN: [id number]     Arrival date & time 04/06/13  1105 History     First MD Initiated Contact with Patient 04/06/13 1118     Chief Complaint  Patient presents with  . Chest Pain   (Consider location/radiation/quality/duration/timing/severity/associated sxs/prior Treatment) Patient is a 57 y.o. male presenting with chest pain. The history is provided by the patient. No language interpreter was used.  Chest Pain Pain location:  R chest Pain quality: aching   Pain radiates to:  Does not radiate Associated symptoms: no abdominal pain, no cough, no fever, no nausea, no numbness, no shortness of breath and no weakness   Associated symptoms comment:  Right sided chest pain since this morning. It is worse with movement and deep breathing. No shortness of breath, diaphoresis, nausea, vomiting or cough. He denies fever, malaise, anorexia. He states he has a history of cardiac related chest pain but that this is not similar to those symptoms. He did not take any nitro at home. He states that he came for evaluation because the pain today is constant where before it was intermittent, and he was concerned.    Past Medical History  Diagnosis Date  . Hypertension   . Gout   . Atrial fibrillation     On coumadin  . DIABETES MELLITUS, TYPE II 03/06/2009  . HYPERTENSION, BENIGN ESSENTIAL 04/07/2009  . LIVER FUNCTION TESTS, ABNORMAL, HX OF 12/30/2006  . History of cardiac arrhythmia 12/18/2011    S/p cardioversion ?    . LV dysfunction 12/18/2011    EF 60-65% by echo 12/2011; 2 D echo (2008)Overall left ventricular systolic function was moderately to markedly decreased.Left ventricular ejection fraction was estimated , range being 25% to 35 %.There was severe diffuse left ventricular hypokinesis.     . Rectal bleeding 12/18/2011    Scheduled for colonoscopy.    [name] HEPATITIS B, CHRONIC 12/30/2006  . OSTEOCHONDROMA 12/30/2006     LEFT SHOULDER   . HYPERCHOLESTEROLEMIA 07/11/2010  . MITRAL REGURGITATION 12/30/2006   . COLONIC POLYPS, HX OF 12/30/2006  . Heart murmur   . CAD (coronary artery disease), native coronary artery     Nonobstructive CAD by cath 2013 - diffuse distal and branch vessel CAD, no severe disease in the major coronaries, LV mild global hypokinesis, EF 45%  . Congestive heart failure, unspecified   . H/O atrial flutter 2007   Past Surgical History  Procedure Laterality Date  . Osteochondroma excision  1972  . Coronary angioplasty  12/05/01  . A flutter ablation  2007, 2008    catheter ablation    Family History  Problem Relation Age of Onset  . Diabetes Mother   . Hypertension Mother    History  Substance Use Topics  . Smoking status: Former Smoker -- 0.20 packs/day for 18 years    Quit date: 08/14/1983  . Smokeless tobacco: Never Used  . Alcohol Use: 7.0 oz/week    14 drink(s) per week    Review of Systems  Constitutional: Negative for fever and chills.  HENT: Negative.  Negative for neck pain.   Respiratory: Negative.  Negative for cough and shortness of breath.   Cardiovascular: Positive for chest pain.  Gastrointestinal: Negative.  Negative for nausea and abdominal pain.  Musculoskeletal: Negative.  Negative for myalgias.  Skin: Negative.  Negative for rash.  Neurological: Negative.  Negative for weakness and numbness.    Allergies  Other  Home Medications   Current Outpatient Rx  Name  Route  Sig  Dispense  Refill  . carvedilol (COREG) 25 MG tablet   Oral   Take 1 tablet (25 mg total) by mouth 2 (two) times daily with a meal.   180 tablet   3   . colchicine (COLCRYS) 0.6 MG tablet   Oral   Take 0.6 mg by mouth 3 (three) times daily as needed (Gout flare up).          . furosemide (LASIX) 40 MG tablet   Oral   Take 1 tablet (40 mg total) by mouth daily.   30 tablet   0   . hydrALAZINE (APRESOLINE) 25 MG tablet   Oral   Take 1 tablet (25 mg total) by mouth 3 (three) times daily.   270 tablet   3   . insulin aspart (NOVOLOG) 100 unit/ml  SOLN   Subcutaneous   Inject 7 Units into the skin 3 (three) times daily with meals. novolog pen         . insulin glargine (LANTUS) 100 UNIT/ML injection   Subcutaneous   Inject 50 Units into the skin at bedtime.          . isosorbide mononitrate (IMDUR) 30 MG 24 hr tablet   Oral   Take 1 tablet (30 mg total) by mouth daily.   90 tablet   3   . losartan (COZAAR) 100 MG tablet   Oral   Take 1 tablet (100 mg total) by mouth daily.   30 tablet   6   . lovastatin (MEVACOR) 40 MG tablet   Oral   Take 1 tablet (40 mg total) by mouth at bedtime.   30 tablet   5   . metFORMIN (GLUCOPHAGE) 1000 MG tablet   Oral   Take 1 tablet (1,000 mg total) by mouth 2 (two) times daily.   60 tablet   3   . nitroGLYCERIN (NITROSTAT) 0.4 MG SL tablet   Sublingual   Place 1 tablet (0.4 mg total) under the tongue every 5 (five) minutes x 3 doses as needed for chest pain (up to 3 doses).   100 tablet   3   . potassium chloride SA (K-DUR,KLOR-CON) 20 MEQ tablet   Oral   Take 1 tablet (20 mEq total) by mouth daily.   30 tablet   0   . warfarin (COUMADIN) 5 MG tablet   Oral   Take 2.5-5 mg by mouth daily. Take 5 mg (one tablet) on Monday, Wednesday, Friday, Saturday, and Sunday  Take 2.5 mg (1/2 tablet) on Tuesday & Thursday         . allopurinol (ZYLOPRIM) 100 MG tablet   Oral   Take 100 mg by mouth daily.          BP 158/92  Pulse 72  Temp(Src) 98.2 F (36.8 C)  Resp 16  SpO2 100% Physical Exam  Constitutional: He is oriented to person, place, and time. He appears well-developed and well-nourished.  HENT:  Head: Normocephalic.  Neck: Normal range of motion. Neck supple.  Cardiovascular: Normal rate and regular rhythm.   Pulmonary/Chest: Effort normal and breath sounds normal. He has no wheezes. He has no rales. He exhibits tenderness.  Abdominal: Soft. Bowel sounds are normal. There is no tenderness. There is no rebound and no guarding.  Musculoskeletal: Normal range  of motion. He exhibits no edema.  Neurological: He is alert and oriented to person, place, and time.  Skin: Skin is warm and dry. No rash noted.  Psychiatric: He has  a normal mood and affect.    ED Course   Procedures (including critical care time)  Labs Reviewed  CBC - Abnormal; Notable for the following:    Platelets 147 (*)    All other components within normal limits  COMPREHENSIVE METABOLIC PANEL - Abnormal; Notable for the following:    Glucose, Bld 160 (*)    Albumin 3.4 (*)    GFR calc non Af Amer 90 (*)    All other components within normal limits  PROTIME-INR - Abnormal; Notable for the following:    Prothrombin Time 21.8 (*)    INR 1.97 (*)    All other components within normal limits  APTT - Abnormal; Notable for the following:    aPTT 43 (*)    All other components within normal limits  POCT I-STAT TROPONIN I   Date: 04/06/2013  Rate: 71  Rhythm: normal sinus rhythm  QRS Axis: normal  Intervals: normal  ST/T Wave abnormalities: normal  Conduction Disutrbances:none  Narrative Interpretation:   Old EKG Reviewed: unchanged   Dg Chest 2 View  04/06/2013   CLINICAL DATA:  Chest pain. Hypertension and diabetes. Coronary artery disease.  EXAM: CHEST  2 VIEW  COMPARISON:  12/15/2012  FINDINGS: Mild cardiomegaly and tortuosity of thoracic aorta again noted. No evidence of acute infiltrate or edema. No evidence of pleural effusion. No mass or lymphadenopathy identified. Low lung volumes noted, especially on the lateral projection.  IMPRESSION: Stable mild cardiomegaly. No active lung disease.   Electronically Signed   By: Myles Rosenthal   On: 04/06/2013 12:28   No diagnosis found. 1. Chest wall pain  MDM  Chest pain is constant and reproducible. EKG is nonacute, negative lab studies, including 2 troponins. Doubt ACS. Patient stable for discharge.    Arnoldo Hooker, PA-C 04/06/13 1543  Arnoldo Hooker, PA-C 04/06/13 1544

## 2013-04-14 ENCOUNTER — Ambulatory Visit (INDEPENDENT_AMBULATORY_CARE_PROVIDER_SITE_OTHER): Payer: Self-pay | Admitting: *Deleted

## 2013-04-14 DIAGNOSIS — Z7901 Long term (current) use of anticoagulants: Secondary | ICD-10-CM

## 2013-04-14 DIAGNOSIS — I4891 Unspecified atrial fibrillation: Secondary | ICD-10-CM

## 2013-04-14 LAB — POCT INR: INR: 2

## 2013-04-14 NOTE — ED Provider Notes (Signed)
Shared service with midlevel provider. I have personally seen and examined the patient, providing direct face to face care, presenting with the chief complaint of chest pain. Physical exam findings include reproducible chest pain. Plan will be troponin and EKG, if negative, discharge. Return precautions discussed. I have reviewed the nursing documentation on past medical history, family history, and social history.   Derwood Kaplan, MD 04/14/13 1626

## 2013-05-04 ENCOUNTER — Telehealth: Payer: Self-pay | Admitting: Cardiology

## 2013-05-04 NOTE — Telephone Encounter (Signed)
Pt's daughter states her mother passed away on 2023/01/18, her funeral will be this 01/18/23.  Pt has been having chest pain off and on since 01/18/2023. Pt currently having chest pain but "not bad". Pt has not tried NTG.  I advised pt's daughter to take pt to ER now for evaluation, pt declined and is adamant about not going to ER. Pt's daughter requesting appt for pt before funeral on 01-18-23.  I have given pt an appt with Dr Shirlee Latch 05/05/13. I told pt's daughter I was concerned about him waiting until tomorrow to be checked, he should use NTG X 3 every 5 minutes. Daughter aware pt should go to ER if chest pain not relieved with NTG or if symptoms worsen or change. Pt's daughter verbalized understanding of recommendations and concerns.

## 2013-05-04 NOTE — Telephone Encounter (Signed)
New Problem  Pt's wife recently passed away and is experiencing severe chest discomfort due to stress..he is in a lot of pain, Pt does not want to go to the hospital before the funeral. Daughter Alphonzo Lemmings [phone] wanted to know if her dad will be ok with the current symptoms Daughter also asks that you call and give suggestions as to what she should do to keep him well because the pt refuses to go to the hospital.

## 2013-05-05 ENCOUNTER — Encounter: Payer: Self-pay | Admitting: Cardiology

## 2013-05-05 ENCOUNTER — Ambulatory Visit (INDEPENDENT_AMBULATORY_CARE_PROVIDER_SITE_OTHER): Payer: Self-pay | Admitting: Cardiology

## 2013-05-05 VITALS — BP 168/82 | HR 83 | Ht 72.0 in | Wt 156.0 lb

## 2013-05-05 DIAGNOSIS — I10 Essential (primary) hypertension: Secondary | ICD-10-CM

## 2013-05-05 DIAGNOSIS — I509 Heart failure, unspecified: Secondary | ICD-10-CM

## 2013-05-05 DIAGNOSIS — R079 Chest pain, unspecified: Secondary | ICD-10-CM

## 2013-05-05 DIAGNOSIS — I4891 Unspecified atrial fibrillation: Secondary | ICD-10-CM

## 2013-05-05 DIAGNOSIS — I5022 Chronic systolic (congestive) heart failure: Secondary | ICD-10-CM

## 2013-05-05 DIAGNOSIS — I2510 Atherosclerotic heart disease of native coronary artery without angina pectoris: Secondary | ICD-10-CM

## 2013-05-05 DIAGNOSIS — E7800 Pure hypercholesterolemia, unspecified: Secondary | ICD-10-CM

## 2013-05-05 MED ORDER — FUROSEMIDE 40 MG PO TABS: 40.0000 mg | ORAL_TABLET | Freq: Every day | ORAL | Status: DC

## 2013-05-05 MED ORDER — HYDRALAZINE HCL 50 MG PO TABS: 50.0000 mg | ORAL_TABLET | Freq: Three times a day (TID) | ORAL | Status: DC

## 2013-05-05 MED ORDER — ATORVASTATIN CALCIUM 40 MG PO TABS: 40.0000 mg | ORAL_TABLET | Freq: Every day | ORAL | Status: DC

## 2013-05-05 MED ORDER — ISOSORBIDE MONONITRATE ER 60 MG PO TB24: 60.0000 mg | ORAL_TABLET | Freq: Every day | ORAL | Status: DC

## 2013-05-05 MED ORDER — LOSARTAN POTASSIUM 100 MG PO TABS: 100.0000 mg | ORAL_TABLET | Freq: Every day | ORAL | Status: DC

## 2013-05-05 NOTE — Patient Instructions (Addendum)
Stop lovastatin.  Start atorvastatin 40mg  daily.  Increase hydralazine to 50mg  three times a day. You can take 2 of your 25mg  tablets three times a day and use your current supply.  Increase Imdur(isosorbide) to 60mg  daily. You can take 2 of your 30mg  tablets daily at the same time and use your current supply.  Your physician recommends that you schedule a follow-up appointment in: 1 month with Dr Shirlee Latch.  Your physician recommends that you return for a FASTING lipid profile /liver profile in 2 months.   You have been referred to [facility] to establish with a primary care doctor.

## 2013-05-06 NOTE — Progress Notes (Signed)
Patient ID: [name] [name], male   DOB: [birth year], 57 y.o.   MRN: [id number] 57 yo with history of HTN, diabetes, paroxysmal atrial fibrillation, and CAD presents for cardiology followup.  He was admitted in 5/13 with chest pain with exertion.  LHC was done showing global HK with EF 45% and diffuse, severe distal and branch vessel disease.  There was no interventional option, but I suspect that this disease could be causing his anginal-type pain.  Echo at that time was read as showing EF 60-65%.  He was admitted in 5/14 with hypertensive urgency and chest pain.  He ruled out for MI and BP was controlled.  His EF was 50-55% by echo.  ETT-Sestamibi done as outpatient showed small fixed inferior defect with no ischemia but EF was 31%.  He was quite hypertensive during the study.  Cardiac MRI done to confirm EF in 5/14 showed EF 47%.  He has continued to have trouble with BP control and medication compliance.  BP is 168/82 today.  He is out of Lasix (for a couple of days) and losartan (for a couple of days).  He is under a lot of stress.  His wife died suddenly last week and he is grieving.  The funeral will be Friday.  2 nights ago, he had chest pain that lasted for several hours then resolved on its own.  He has not had any chest pain since then.  He thinks it was related to stress.  He has not had exertional chest pain.  He is not particularly short of breath with exertion.   Labs (5/13): K 3.6, creatinine 0.93, LDL 85, HDL 57 Labs (7/13): K 3.9, creatinine 1.1, LDL 57, HDL 51  Labs (5/14): K 4, creatinine 1.25 Labs (8/14): K 3.7, creatinine 0.9, LDL 103, HDL 42  ECG: NSR, LVH with repolarization abnormalities, inferior T wave inversions  PMH: 1. DM2 2. Gout 3. HTN: Cough with ACEI.  4. Cardiomyopathy: Suspect mixed ischemic/nonischemic (?ETOH-related).  EF 35% in 2008.  Echo in 5/13 showed EF 60-65% with moderate LVH but EF was 45% on LV-gram in 5/13.  Echo (5/14) with EF 50-55%, mild LVH,  inferobasal HK, mild MR.  ETT-Sestamibi in 5/14, however, showed EF 31%.  Cardiac MRI (5/14) with EF 47%, mild global hypokinesis, subepicardial delayed enhancement in a nonspecific RV insertion site pattern.  5. CAD: LHC in 2010 with mild nonobstructive disease.  LHC (5/13) with diffuse distal and branch vessel disease, mild global hypokinesis and EF 45%.  ETT-Sestamibi (5/14): EF 31%, small fixed inferior defect with no ischemia.  6. H/o chronic HBV 7. Osteochondroma left shoulder.  8. Hyperlipidemia 9. Paroxysmal atrial fibrillation: Coumadin.  Developed cough and increased ESR with amiodarone.  10. Atrial flutter: had ablations in 2007 and 2008.   SH: Married, prior smoker.  Some ETOH, occasionally heavy in past.  Does use occasional marijuana.  Out of work Chiropractor. 2 daughters attend [employer] in [place].   FH: CAD  ROS: All systems reviewed and negative except as per HPI.   Current Outpatient Prescriptions  Medication Sig Dispense Refill  . carvedilol (COREG) 25 MG tablet Take 1 tablet (25 mg total) by mouth 2 (two) times daily with a meal.  180 tablet  3  . colchicine (COLCRYS) 0.6 MG tablet Take 0.6 mg by mouth 3 (three) times daily as needed (Gout flare up).       . furosemide (LASIX) 40 MG tablet Take 1 tablet (40 mg total) by mouth daily.  30  tablet  6  . insulin aspart (NOVOLOG) 100 unit/ml SOLN Inject 7 Units into the skin 3 (three) times daily with meals. novolog pen      . insulin glargine (LANTUS) 100 UNIT/ML injection Inject 50 Units into the skin at bedtime.       [name] losartan (COZAAR) 100 MG tablet Take 1 tablet (100 mg total) by mouth daily.  30 tablet  6  . metFORMIN (GLUCOPHAGE) 1000 MG tablet Take 1 tablet (1,000 mg total) by mouth 2 (two) times daily.  60 tablet  3  . nitroGLYCERIN (NITROSTAT) 0.4 MG SL tablet Place 1 tablet (0.4 mg total) under the tongue every 5 (five) minutes x 3 doses as needed for chest pain (up to 3 doses).  100 tablet  3  . warfarin (COUMADIN) 5  MG tablet Take 2.5-5 mg by mouth daily. Take 5 mg (one tablet) on Monday, Wednesday, Friday, Saturday, and Sunday  Take 2.5 mg (1/2 tablet) on Tuesday & Thursday      . allopurinol (ZYLOPRIM) 100 MG tablet Take 100 mg by mouth daily.      [name] atorvastatin (LIPITOR) 40 MG tablet Take 1 tablet (40 mg total) by mouth daily.  30 tablet  3  . hydrALAZINE (APRESOLINE) 50 MG tablet Take 1 tablet (50 mg total) by mouth 3 (three) times daily.  90 tablet  6  . isosorbide mononitrate (IMDUR) 60 MG 24 hr tablet Take 1 tablet (60 mg total) by mouth daily.  30 tablet  6  . potassium chloride SA (K-DUR,KLOR-CON) 20 MEQ tablet Take 1 tablet (20 mEq total) by mouth daily.  30 tablet  0   No current facility-administered medications for this visit.    BP 168/82  Pulse 83  Ht 6' (1.829 m)  Wt 70.761 kg (156 lb)  BMI 21.15 kg/m2 General: NAD Neck: JVP 7 cm, no thyromegaly or thyroid nodule.  Lungs: Clear to auscultation bilaterally with normal respiratory effort. CV: Nondisplaced PMI.  Heart regular S1/S2, no S3/S4, no murmur.  No peripheral edema.  No carotid bruit.  Normal pedal pulses.  Abdomen: Soft, nontender, no hepatosplenomegaly, no distention.  Neurologic: Alert and oriented x 3.  Psych: Normal affect. Extremities: No clubbing or cyanosis.   Assessment/Plan:  Atrial fibrillation Paroxysmal atrial fibrillation, continue coumadin. He is in NSR today.  Coronary artery disease Diffuse distal and branch vessel disease not amenable to intervention on cath in 5/13. This disease certainly could cause angina.  Recent ETT-Sestamibi (5/14) with small inferior fixed defect, no ischemia.  Angina is reasonably controlled today on current meds => no exertional chest pain.  He had a recent chest pain episode in the setting of his wife's death.   - He is on warfarin so does not have to take ASA given stable disease.    - Continue current Coreg and Imdur for angina control.  He is no longer taking amlodipine or  ranolazine. - Continue statin.  HYPERCHOLESTEROLEMIA  LDL too high.  I am going to put him back on atorvastatin 40 mg daily, which he should be able to get generically.  Lipids/LFTs in 2 months.   Cardiomyopathy  EF 47% on recent cardiac MRI.  Suspect mixed ischemic/nonischemic (?ETOH) cardiomyopathy.  - Continue current Coreg and refill losartan. - For BP control, will increase hydralazine to 50 mg tid and continue Imdur.  - Avoid ETOH. Hypertension BP high today.  Refill losartan and increase hydralazine to 50 mg tid.   Needs PCP => gave him the  number for the [facility] and [facility].   Followup in 1 month.   Marca Ancona 05/06/2013 7:51 AM

## 2013-05-13 ENCOUNTER — Inpatient Hospital Stay: Payer: Self-pay

## 2013-05-13 ENCOUNTER — Telehealth: Payer: Self-pay

## 2013-05-13 MED ORDER — WARFARIN SODIUM 5 MG PO TABS: 5.0000 mg | ORAL_TABLET | ORAL | Status: DC

## 2013-05-13 NOTE — Telephone Encounter (Signed)
Telephoned pt that Rx was done, he states he missed his appt on 05-23-2023 because his spouse died.  Made pt aware that when he saw Dr Shirlee Latch on 9/23 we could have checked it at that time. Appt s/c for tomorrow for INR check.

## 2013-05-14 ENCOUNTER — Ambulatory Visit (INDEPENDENT_AMBULATORY_CARE_PROVIDER_SITE_OTHER): Payer: Self-pay | Admitting: *Deleted

## 2013-05-14 DIAGNOSIS — I4891 Unspecified atrial fibrillation: Secondary | ICD-10-CM

## 2013-05-14 DIAGNOSIS — Z7901 Long term (current) use of anticoagulants: Secondary | ICD-10-CM

## 2013-06-11 ENCOUNTER — Ambulatory Visit (INDEPENDENT_AMBULATORY_CARE_PROVIDER_SITE_OTHER): Payer: Self-pay | Admitting: Pharmacist

## 2013-06-11 DIAGNOSIS — Z7901 Long term (current) use of anticoagulants: Secondary | ICD-10-CM

## 2013-06-11 DIAGNOSIS — I4891 Unspecified atrial fibrillation: Secondary | ICD-10-CM

## 2013-06-11 LAB — POCT INR: INR: 2.2

## 2013-07-16 ENCOUNTER — Ambulatory Visit (INDEPENDENT_AMBULATORY_CARE_PROVIDER_SITE_OTHER): Payer: Medicare Other | Admitting: General Practice

## 2013-07-16 ENCOUNTER — Telehealth: Payer: Self-pay | Admitting: Internal Medicine

## 2013-07-16 DIAGNOSIS — Z7901 Long term (current) use of anticoagulants: Secondary | ICD-10-CM | POA: Diagnosis not present

## 2013-07-16 DIAGNOSIS — I4891 Unspecified atrial fibrillation: Secondary | ICD-10-CM

## 2013-07-16 LAB — POCT INR: INR: 2.6

## 2013-07-16 NOTE — Telephone Encounter (Signed)
Pt came in today to schedule an appt to establish care for 07/31/13; Pt is a diabetic and would like to schedule an appt to refill insulin glargine (LANTUS) 100 UNIT/ML injection  to hold him over until 12/19;

## 2013-07-17 MED ORDER — INSULIN GLARGINE 100 UNIT/ML ~~LOC~~ SOLN: 50.0000 [IU] | Freq: Every day | SUBCUTANEOUS | Status: DC

## 2013-07-17 NOTE — Addendum Note (Signed)
Addended by: Nonnie Done D on: 07/17/2013 03:11 PM   Modules accepted: Orders, Medications

## 2013-07-17 NOTE — Telephone Encounter (Signed)
Spoke with pt regarding Lantus refill. Script E-scribed to [facility] pharmacy per Dr. Hyman Hopes

## 2013-07-31 ENCOUNTER — Encounter (HOSPITAL_COMMUNITY): Payer: Self-pay | Admitting: Emergency Medicine

## 2013-07-31 ENCOUNTER — Encounter: Payer: Self-pay | Admitting: Internal Medicine

## 2013-07-31 ENCOUNTER — Observation Stay (HOSPITAL_COMMUNITY)
Admission: EM | Admit: 2013-07-31 | Discharge: 2013-08-01 | Disposition: A | Discharge status: Home / Self Care | Payer: Medicare Other | Attending: Internal Medicine | Admitting: Internal Medicine

## 2013-07-31 ENCOUNTER — Ambulatory Visit: Payer: Medicare Other | Attending: Internal Medicine | Admitting: Internal Medicine

## 2013-07-31 ENCOUNTER — Emergency Department (HOSPITAL_COMMUNITY): Payer: Medicare Other

## 2013-07-31 VITALS — BP 191/110 | HR 84 | Temp 98.5°F | Resp 16 | Wt 218.6 lb

## 2013-07-31 DIAGNOSIS — I509 Heart failure, unspecified: Secondary | ICD-10-CM | POA: Diagnosis not present

## 2013-07-31 DIAGNOSIS — I10 Essential (primary) hypertension: Secondary | ICD-10-CM | POA: Insufficient documentation

## 2013-07-31 DIAGNOSIS — Z862 Personal history of diseases of the blood and blood-forming organs and certain disorders involving the immune mechanism: Secondary | ICD-10-CM

## 2013-07-31 DIAGNOSIS — E119 Type 2 diabetes mellitus without complications: Secondary | ICD-10-CM | POA: Insufficient documentation

## 2013-07-31 DIAGNOSIS — I2510 Atherosclerotic heart disease of native coronary artery without angina pectoris: Secondary | ICD-10-CM | POA: Diagnosis not present

## 2013-07-31 DIAGNOSIS — Z7901 Long term (current) use of anticoagulants: Secondary | ICD-10-CM | POA: Diagnosis not present

## 2013-07-31 DIAGNOSIS — I5022 Chronic systolic (congestive) heart failure: Secondary | ICD-10-CM

## 2013-07-31 DIAGNOSIS — I4891 Unspecified atrial fibrillation: Secondary | ICD-10-CM | POA: Insufficient documentation

## 2013-07-31 DIAGNOSIS — I059 Rheumatic mitral valve disease, unspecified: Secondary | ICD-10-CM

## 2013-07-31 DIAGNOSIS — M109 Gout, unspecified: Secondary | ICD-10-CM | POA: Diagnosis not present

## 2013-07-31 DIAGNOSIS — R079 Chest pain, unspecified: Secondary | ICD-10-CM | POA: Diagnosis not present

## 2013-07-31 DIAGNOSIS — I200 Unstable angina: Secondary | ICD-10-CM

## 2013-07-31 DIAGNOSIS — D169 Benign neoplasm of bone and articular cartilage, unspecified: Secondary | ICD-10-CM

## 2013-07-31 DIAGNOSIS — Z23 Encounter for immunization: Secondary | ICD-10-CM | POA: Diagnosis not present

## 2013-07-31 DIAGNOSIS — Z86010 Personal history of colonic polyps: Secondary | ICD-10-CM

## 2013-07-31 DIAGNOSIS — R0602 Shortness of breath: Principal | ICD-10-CM | POA: Insufficient documentation

## 2013-07-31 DIAGNOSIS — K625 Hemorrhage of anus and rectum: Secondary | ICD-10-CM

## 2013-07-31 DIAGNOSIS — K029 Dental caries, unspecified: Secondary | ICD-10-CM

## 2013-07-31 DIAGNOSIS — B379 Candidiasis, unspecified: Secondary | ICD-10-CM

## 2013-07-31 DIAGNOSIS — B181 Chronic viral hepatitis B without delta-agent: Secondary | ICD-10-CM

## 2013-07-31 DIAGNOSIS — E7800 Pure hypercholesterolemia, unspecified: Secondary | ICD-10-CM | POA: Diagnosis present

## 2013-07-31 DIAGNOSIS — Z8679 Personal history of other diseases of the circulatory system: Secondary | ICD-10-CM

## 2013-07-31 DIAGNOSIS — Z8719 Personal history of other diseases of the digestive system: Secondary | ICD-10-CM

## 2013-07-31 LAB — BASIC METABOLIC PANEL
Calcium: 8.9 mg/dL (ref 8.4–10.5)
Chloride: 105 mEq/L (ref 96–112)
GFR calc non Af Amer: 72 mL/min — ABNORMAL LOW (ref >90)
Glucose, Bld: 144 mg/dL — ABNORMAL HIGH (ref 70–99)
Sodium: 138 mEq/L (ref 135–145)

## 2013-07-31 LAB — CBC
HCT: 44 % (ref 39.0–52.0)
Hemoglobin: 14.7 g/dL (ref 13.0–17.0)
MCH: 27.6 pg (ref 26.0–34.0)
MCHC: 33.4 g/dL (ref 30.0–36.0)
MCV: 82.6 fL (ref 78.0–100.0)
Platelets: 144 10*3/uL — ABNORMAL LOW (ref 150–400)
RBC: 5.33 MIL/uL (ref 4.22–5.81)

## 2013-07-31 LAB — LIPID PANEL
Cholesterol: 143 mg/dL (ref 0–200)
Total CHOL/HDL Ratio: 3.1 RATIO
VLDL: 34 mg/dL (ref 0–40)

## 2013-07-31 LAB — POCT I-STAT TROPONIN I: Troponin i, poc: 0.02 ng/mL (ref 0.00–0.08)

## 2013-07-31 LAB — GLUCOSE, CAPILLARY: Glucose-Capillary: 102 mg/dL — ABNORMAL HIGH (ref 70–99)

## 2013-07-31 LAB — TROPONIN I: Troponin I: <0.3 ng/mL (ref <0.30)

## 2013-07-31 LAB — POCT GLYCOSYLATED HEMOGLOBIN (HGB A1C): Hemoglobin A1C: 6.9

## 2013-07-31 LAB — GLUCOSE, POCT (MANUAL RESULT ENTRY): POC Glucose: 1363 mg/dl — AB (ref 70–99)

## 2013-07-31 LAB — MAGNESIUM: Magnesium: 2.1 mg/dL (ref 1.5–2.5)

## 2013-07-31 MED ORDER — MORPHINE SULFATE 2 MG/ML IJ SOLN: 2.0000 mg | INTRAMUSCULAR | Status: DC | PRN

## 2013-07-31 MED ORDER — LOSARTAN POTASSIUM 50 MG PO TABS
100.0000 mg | ORAL_TABLET | Freq: Every day | ORAL | Status: DC
Start: 2013-07-31 — End: 2013-08-01
  Administered 2013-07-31 – 2013-08-01 (×2): 100 mg via ORAL
  Filled 2013-07-31 (×2): qty 2

## 2013-07-31 MED ORDER — INSULIN ASPART 100 UNIT/ML ~~LOC~~ SOLN: 0.0000 [IU] | Freq: Every day | SUBCUTANEOUS | Status: DC

## 2013-07-31 MED ORDER — HYDRALAZINE HCL 50 MG PO TABS
50.0000 mg | ORAL_TABLET | Freq: Three times a day (TID) | ORAL | Status: DC
  Administered 2013-07-31 – 2013-08-01 (×3): 50 mg via ORAL
  Filled 2013-07-31 (×5): qty 1

## 2013-07-31 MED ORDER — ISOSORBIDE MONONITRATE ER 60 MG PO TB24
60.0000 mg | ORAL_TABLET | Freq: Every day | ORAL | Status: DC
  Administered 2013-07-31 – 2013-08-01 (×2): 60 mg via ORAL
  Filled 2013-07-31 (×2): qty 1

## 2013-07-31 MED ORDER — INSULIN ASPART 100 UNIT/ML FLEXPEN: 7.0000 [IU] | Freq: Three times a day (TID) | SUBCUTANEOUS | Status: DC

## 2013-07-31 MED ORDER — FUROSEMIDE 40 MG PO TABS
40.0000 mg | ORAL_TABLET | Freq: Every day | ORAL | Status: DC
  Administered 2013-07-31 – 2013-08-01 (×2): 40 mg via ORAL
  Filled 2013-07-31 (×2): qty 1

## 2013-07-31 MED ORDER — INSULIN ASPART 100 UNIT/ML ~~LOC~~ SOLN
7.0000 [IU] | Freq: Three times a day (TID) | SUBCUTANEOUS | Status: DC
  Administered 2013-08-01: 7 [IU] via SUBCUTANEOUS

## 2013-07-31 MED ORDER — ATORVASTATIN CALCIUM 80 MG PO TABS
80.0000 mg | ORAL_TABLET | Freq: Every day | ORAL | Status: DC
  Administered 2013-07-31 – 2013-08-01 (×2): 80 mg via ORAL
  Filled 2013-07-31 (×2): qty 1

## 2013-07-31 MED ORDER — AMLODIPINE BESYLATE 10 MG PO TABS
10.0000 mg | ORAL_TABLET | Freq: Every day | ORAL | Status: DC
  Administered 2013-07-31 – 2013-08-01 (×2): 10 mg via ORAL
  Filled 2013-07-31 (×2): qty 1

## 2013-07-31 MED ORDER — NITROGLYCERIN 0.4 MG SL SUBL: 0.4000 mg | SUBLINGUAL_TABLET | SUBLINGUAL | Status: DC | PRN

## 2013-07-31 MED ORDER — ONDANSETRON HCL 4 MG/2ML IJ SOLN: 4.0000 mg | Freq: Four times a day (QID) | INTRAMUSCULAR | Status: DC | PRN

## 2013-07-31 MED ORDER — WARFARIN SODIUM 7.5 MG PO TABS
7.5000 mg | ORAL_TABLET | Freq: Once | ORAL | Status: AC
  Administered 2013-07-31: 7.5 mg via ORAL
  Filled 2013-07-31: qty 1

## 2013-07-31 MED ORDER — WARFARIN - PHARMACIST DOSING INPATIENT: Freq: Every day | Status: DC

## 2013-07-31 MED ORDER — ACETAMINOPHEN 325 MG PO TABS
650.0000 mg | ORAL_TABLET | ORAL | Status: DC | PRN
  Administered 2013-08-01: 650 mg via ORAL
  Filled 2013-07-31: qty 2

## 2013-07-31 MED ORDER — COLCHICINE 0.6 MG PO TABS
0.6000 mg | ORAL_TABLET | Freq: Three times a day (TID) | ORAL | Status: DC | PRN
  Filled 2013-07-31: qty 1

## 2013-07-31 MED ORDER — INSULIN GLARGINE 100 UNIT/ML ~~LOC~~ SOLN
60.0000 [IU] | Freq: Every day | SUBCUTANEOUS | Status: DC
  Filled 2013-07-31 (×2): qty 0.6

## 2013-07-31 MED ORDER — INSULIN ASPART 100 UNIT/ML ~~LOC~~ SOLN
0.0000 [IU] | Freq: Three times a day (TID) | SUBCUTANEOUS | Status: DC
  Administered 2013-07-31 – 2013-08-01 (×3): 3 [IU] via SUBCUTANEOUS

## 2013-07-31 MED ORDER — CARVEDILOL 25 MG PO TABS
25.0000 mg | ORAL_TABLET | Freq: Two times a day (BID) | ORAL | Status: DC
Start: 2013-08-01 — End: 2013-08-01
  Administered 2013-08-01: 25 mg via ORAL
  Filled 2013-07-31 (×3): qty 1

## 2013-07-31 MED ORDER — HEPARIN SODIUM (PORCINE) 5000 UNIT/ML IJ SOLN
5000.0000 [IU] | Freq: Three times a day (TID) | INTRAMUSCULAR | Status: DC
  Administered 2013-07-31 – 2013-08-01 (×2): 5000 [IU] via SUBCUTANEOUS
  Filled 2013-07-31 (×5): qty 1

## 2013-07-31 MED ORDER — INFLUENZA VAC SPLIT QUAD 0.5 ML IM SUSP
0.5000 mL | INTRAMUSCULAR | Status: AC
  Administered 2013-08-01: 0.5 mL via INTRAMUSCULAR
  Filled 2013-07-31: qty 0.5

## 2013-07-31 NOTE — Progress Notes (Signed)
Patient here for medication refills Complain of some SOB Chest pain Some pain to left arm Started a couple of days ago

## 2013-07-31 NOTE — Progress Notes (Signed)
MRN: [id number] Name: [name] [name]  Sex: male Age: 57 y.o. DOB: 02/16/1956  Allergies: Other  Chief Complaint  Patient presents with  . Shortness of Breath  . medication refills    HPI: Patient is 57 y.o. male whocomes for the first time to establish medical care, he has history of hypertension diabetes hyperlipidemia CAD, paroxysmal afib, patient is on Coumadin and following up with cardiology. Patient today complaining of worsening shortness of breath had chest last night radiating to the left arm patient has not taken his blood pressure medication today, for 5 days ago patient felt dizzy and was about to fall down complaining of orthopnea denies any leg swelling today his vitals showed elevated blood pressure 191/110 oxygen saturation 95% has occasional blurry vision denies any nausea vomiting or active chest pain, EKG in the office showed new V4 and V5 ST- T changes compared to EKG done in September.  Past Medical History  Diagnosis Date  . Hypertension   . Gout   . Atrial fibrillation     On coumadin  . DIABETES MELLITUS, TYPE II 03/06/2009  . HYPERTENSION, BENIGN ESSENTIAL 04/07/2009  . LIVER FUNCTION TESTS, ABNORMAL, HX OF 12/30/2006  . History of cardiac arrhythmia 12/18/2011    S/p cardioversion ?    . LV dysfunction 12/18/2011    EF 60-65% by echo 12/2011; 2 D echo (2008)Overall left ventricular systolic function was moderately to markedly decreased.Left ventricular ejection fraction was estimated , range being 25% to 35 %.There was severe diffuse left ventricular hypokinesis.     . Rectal bleeding 12/18/2011    Scheduled for colonoscopy.    [name] HEPATITIS B, CHRONIC 12/30/2006  . OSTEOCHONDROMA 12/30/2006     LEFT SHOULDER   . HYPERCHOLESTEROLEMIA 07/11/2010  . MITRAL REGURGITATION 12/30/2006  . COLONIC POLYPS, HX OF 12/30/2006  . Heart murmur   . CAD (coronary artery disease), native coronary artery     Nonobstructive CAD by cath 2013 - diffuse distal and branch vessel CAD, no  severe disease in the major coronaries, LV mild global hypokinesis, EF 45%  . Congestive heart failure, unspecified   . H/O atrial flutter 2007    Past Surgical History  Procedure Laterality Date  . Osteochondroma excision  1972  . Coronary angioplasty  12/05/01  . A flutter ablation  2007, 2008    catheter ablation       Medication List       This list is accurate as of: 07/31/13 11:48 AM.  Always use your most recent med list.               allopurinol 100 MG tablet  Commonly known as:  ZYLOPRIM  Take 300 mg by mouth daily.     amLODipine 10 MG tablet  Commonly known as:  NORVASC  Take 10 mg by mouth daily.     atorvastatin 40 MG tablet  Commonly known as:  LIPITOR  Take 80 mg by mouth daily.     carvedilol 25 MG tablet  Commonly known as:  COREG  Take 1 tablet (25 mg total) by mouth 2 (two) times daily with a meal.     COLCRYS 0.6 MG tablet  Generic drug:  colchicine  Take 0.6 mg by mouth 3 (three) times daily as needed (Gout flare up).     furosemide 40 MG tablet  Commonly known as:  LASIX  Take 1 tablet (40 mg total) by mouth daily.     hydrALAZINE 50 MG tablet  Commonly known  as:  APRESOLINE  Take 1 tablet (50 mg total) by mouth 3 (three) times daily.     insulin aspart 100 unit/ml Soln  Commonly known as:  novoLOG  Inject 7 Units into the skin 3 (three) times daily with meals. novolog pen     insulin glargine 100 UNIT/ML injection  Commonly known as:  LANTUS  Inject 40 Units into the skin at bedtime.     isosorbide mononitrate 60 MG 24 hr tablet  Commonly known as:  IMDUR  Take 30 mg by mouth daily.     losartan 100 MG tablet  Commonly known as:  COZAAR  Take 1 tablet (100 mg total) by mouth daily.     metFORMIN 1000 MG tablet  Commonly known as:  GLUCOPHAGE  Take 1 tablet (1,000 mg total) by mouth 2 (two) times daily.     nitroGLYCERIN 0.4 MG SL tablet  Commonly known as:  NITROSTAT  Place 1 tablet (0.4 mg total) under the tongue every 5  (five) minutes x 3 doses as needed for chest pain (up to 3 doses).     potassium chloride SA 20 MEQ tablet  Commonly known as:  K-DUR,KLOR-CON  Take 1 tablet (20 mEq total) by mouth daily.     ranolazine 500 MG 12 hr tablet  Commonly known as:  RANEXA  Take 500 mg by mouth 2 (two) times daily.     warfarin 5 MG tablet  Commonly known as:  COUMADIN  Take 1 tablet (5 mg total) by mouth as directed.        Meds ordered this encounter  Medications  . atorvastatin (LIPITOR) 40 MG tablet    Sig: Take 80 mg by mouth daily.  . isosorbide mononitrate (IMDUR) 60 MG 24 hr tablet    Sig: Take 30 mg by mouth daily.  . insulin glargine (LANTUS) 100 UNIT/ML injection    Sig: Inject 40 Units into the skin at bedtime.  [name] amLODipine (NORVASC) 10 MG tablet    Sig: Take 10 mg by mouth daily.  . ranolazine (RANEXA) 500 MG 12 hr tablet    Sig: Take 500 mg by mouth 2 (two) times daily.    Immunization History  Administered Date(s) Administered  . Influenza Split 07/28/2012  . Influenza Whole 06/30/2010  . Pneumococcal Polysaccharide-23 03/06/2009    Family History  Problem Relation Age of Onset  . Diabetes Mother   . Hypertension Mother     History  Substance Use Topics  . Smoking status: Former Smoker -- 0.20 packs/day for 18 years    Quit date: 08/14/1983  . Smokeless tobacco: Never Used  . Alcohol Use: 7.0 oz/week    14 drink(s) per week    Review of Systems  As noted in HPI  Filed Vitals:   07/31/13 1113  BP: 191/110  Pulse: 84  Temp: 98.5 F (36.9 C)  Resp: 16    Physical Exam  Physical Exam  Eyes: EOM are normal. Pupils are equal, round, and reactive to light.  Cardiovascular:  Regularly irregular   Pulmonary/Chest: No respiratory distress. He has no wheezes. He has no rales.  Musculoskeletal: He exhibits no edema.    CBC    Component Value Date/Time   WBC 6.0 04/06/2013 1140   RBC 5.14 04/06/2013 1140   HGB 14.6 04/06/2013 1140   HCT 41.7 04/06/2013 1140    PLT 147* 04/06/2013 1140   MCV 81.1 04/06/2013 1140   LYMPHSABS 2.1 10/09/2010 1310   MONOABS 0.5 10/09/2010  1310   EOSABS 0.2 10/09/2010 1310   BASOSABS 0.0 10/09/2010 1310    CMP     Component Value Date/Time   NA 138 04/06/2013 1140   K 3.7 04/06/2013 1140   CL 104 04/06/2013 1140   CO2 23 04/06/2013 1140   GLUCOSE 160* 04/06/2013 1140   BUN 9 04/06/2013 1140   CREATININE 0.94 04/06/2013 1140   CREATININE 1.10 02/19/2013 1623   CALCIUM 9.1 04/06/2013 1140   PROT 7.2 04/06/2013 1140   ALBUMIN 3.4* 04/06/2013 1140   AST 15 04/06/2013 1140   ALT 12 04/06/2013 1140   ALKPHOS 52 04/06/2013 1140   BILITOT 0.6 04/06/2013 1140   GFRNONAA 90* 04/06/2013 1140   GFRAA >90 04/06/2013 1140    Lab Results  Component Value Date/Time   CHOL 179 04/01/2013  9:23 AM    No components found with this basename: hga1c    Lab Results  Component Value Date/Time   AST 15 04/06/2013 11:40 AM    Assessment and Plan  Chest pain - Plan: EKG 12-Lead, lateral V4 and V5 st-t changes   DM (diabetes mellitus) - Plan: Glucose (CBG), HgB A1c 6.9%  Accelerated hypertension... uncontrolled  Coronary artery disease  Since patient has worsening shortness of breath  has uncontrolled blood pressure recent presyncope episode orthopnea and chest pain radiating to the left arm I have recommended patient to go to the ER for further evaluation and management, report will be given to ER nurse.  Doris Cheadle, MD

## 2013-07-31 NOTE — Progress Notes (Signed)
Patient is being sent to the ed for abnormal Twave change in EKG Spoke with ashley in the ED Patient left here with two family memebers

## 2013-07-31 NOTE — Progress Notes (Addendum)
Pt admitted as observation from ED per stretcher. VS done and telemetry monitor placed. Oriented to room. Call to CMT for monitoring of tele. Pt ref safety video. BP 195/101. Pt says that he has not been taking his meds.

## 2013-07-31 NOTE — ED Provider Notes (Signed)
CSN: [id number]     Arrival date & time 07/31/13  1210 History   First MD Initiated Contact with Patient 07/31/13 1238     Chief Complaint  Patient presents with  . Shortness of Breath   (Consider location/radiation/quality/duration/timing/severity/associated sxs/prior Treatment) Patient is a 57 y.o. male presenting with shortness of breath.  Shortness of Breath Associated symptoms: chest pain   Associated symptoms: no abdominal pain, no headaches, no rash and no vomiting     Patient's had some shortness of breath chest pain the last week. It is somewhat worse with exertion but comes on at rest also. He was seen at his PCP today and was sent in for EKG changes. He is pain-free now. He states he cannot necessarily make the pain come on with exertion. No fevers. No coughing. No abdominal pain. No swelling of legs. No black stool.  Past Medical History  Diagnosis Date  . Hypertension   . Gout   . Atrial fibrillation     On coumadin  . DIABETES MELLITUS, TYPE II 03/06/2009  . HYPERTENSION, BENIGN ESSENTIAL 04/07/2009  . LIVER FUNCTION TESTS, ABNORMAL, HX OF 12/30/2006  . History of cardiac arrhythmia 12/18/2011    S/p cardioversion ?    . LV dysfunction 12/18/2011    EF 60-65% by echo 12/2011; 2 D echo (2008)Overall left ventricular systolic function was moderately to markedly decreased.Left ventricular ejection fraction was estimated , range being 25% to 35 %.There was severe diffuse left ventricular hypokinesis.     . Rectal bleeding 12/18/2011    Scheduled for colonoscopy.    [name] HEPATITIS B, CHRONIC 12/30/2006  . OSTEOCHONDROMA 12/30/2006     LEFT SHOULDER   . HYPERCHOLESTEROLEMIA 07/11/2010  . MITRAL REGURGITATION 12/30/2006  . COLONIC POLYPS, HX OF 12/30/2006  . Heart murmur   . CAD (coronary artery disease), native coronary artery     Nonobstructive CAD by cath 2013 - diffuse distal and branch vessel CAD, no severe disease in the major coronaries, LV mild global hypokinesis, EF 45%  .  Congestive heart failure, unspecified   . H/O atrial flutter 2007   Past Surgical History  Procedure Laterality Date  . Osteochondroma excision  1972  . Coronary angioplasty  12/05/01  . A flutter ablation  2007, 2008    catheter ablation   . Eye surgery     Family History  Problem Relation Age of Onset  . Diabetes Mother   . Hypertension Mother    History  Substance Use Topics  . Smoking status: Former Smoker -- 0.20 packs/day for 18 years    Quit date: 08/14/1983  . Smokeless tobacco: Never Used  . Alcohol Use: 7.0 oz/week    14 drink(s) per week    Review of Systems  Constitutional: Negative for activity change and appetite change.  Eyes: Negative for pain.  Respiratory: Positive for shortness of breath. Negative for chest tightness.   Cardiovascular: Positive for chest pain. Negative for leg swelling.  Gastrointestinal: Negative for nausea, vomiting, abdominal pain and diarrhea.  Genitourinary: Negative for flank pain.  Musculoskeletal: Negative for back pain and neck stiffness.  Skin: Negative for rash.  Neurological: Negative for weakness, numbness and headaches.  Psychiatric/Behavioral: Negative for behavioral problems.    Allergies  Other  Home Medications   Current Outpatient Rx  Name  Route  Sig  Dispense  Refill  . amLODipine (NORVASC) 10 MG tablet   Oral   Take 10 mg by mouth daily.         [name]  atorvastatin (LIPITOR) 40 MG tablet   Oral   Take 80 mg by mouth daily.         . carvedilol (COREG) 25 MG tablet   Oral   Take 25 mg by mouth 2 (two) times daily with a meal.         . furosemide (LASIX) 40 MG tablet   Oral   Take 40 mg by mouth.         . hydrALAZINE (APRESOLINE) 50 MG tablet   Oral   Take 50 mg by mouth.         . isosorbide mononitrate (IMDUR) 60 MG 24 hr tablet   Oral   Take 60 mg by mouth daily.          [name] losartan (COZAAR) 100 MG tablet   Oral   Take 100 mg by mouth daily.         . metFORMIN (GLUCOPHAGE)  1000 MG tablet   Oral   Take 1,000 mg by mouth.         . warfarin (COUMADIN) 5 MG tablet   Oral   Take 2.5-5 mg by mouth daily. Take one tablet (5mg ) everyday except Wednesday take 0.5 tablet (2.5 mg).         . colchicine (COLCRYS) 0.6 MG tablet   Oral   Take 0.6 mg by mouth 3 (three) times daily as needed (Gout flare up).          . insulin aspart (NOVOLOG) 100 unit/ml SOLN   Subcutaneous   Inject 7 Units into the skin 3 (three) times daily with meals. novolog pen         . insulin glargine (LANTUS) 100 UNIT/ML injection   Subcutaneous   Inject 60 Units into the skin at bedtime.          . nitroGLYCERIN (NITROSTAT) 0.4 MG SL tablet   Sublingual   Place 1 tablet (0.4 mg total) under the tongue every 5 (five) minutes x 3 doses as needed for chest pain (up to 3 doses).   100 tablet   3    BP 177/99  Pulse 72  Temp(Src) 98.2 F (36.8 C) (Oral)  Resp 25  Ht 6' (1.829 m)  Wt 218 lb (98.884 kg)  BMI 29.56 kg/m2  SpO2 100% Physical Exam  Nursing note and vitals reviewed. Constitutional: He is oriented to person, place, and time. He appears well-developed and well-nourished.  HENT:  Head: Normocephalic and atraumatic.  Eyes: EOM are normal. Pupils are equal, round, and reactive to light.  Neck: Normal range of motion. Neck supple.  Cardiovascular: Normal rate, regular rhythm and normal heart sounds.   No murmur heard. Pulmonary/Chest: Effort normal and breath sounds normal.  Abdominal: Soft. Bowel sounds are normal. He exhibits no distension and no mass. There is no tenderness. There is no rebound and no guarding.  Musculoskeletal: Normal range of motion. He exhibits no edema.  Neurological: He is alert and oriented to person, place, and time. No cranial nerve deficit.  Skin: Skin is warm and dry.  Psychiatric: He has a normal mood and affect.    ED Course  Procedures (including critical care time) Labs Review Labs Reviewed  CBC - Abnormal; Notable for the  following:    Platelets 144 (*)    All other components within normal limits  BASIC METABOLIC PANEL - Abnormal; Notable for the following:    Glucose, Bld 144 (*)    GFR calc non Af Amer 72 (*)  GFR calc Af Amer 84 (*)    All other components within normal limits  PRO B NATRIURETIC PEPTIDE - Abnormal; Notable for the following:    Pro B Natriuretic peptide (BNP) 277.4 (*)    All other components within normal limits  PROTIME-INR - Abnormal; Notable for the following:    Prothrombin Time 16.5 (*)    All other components within normal limits  POCT I-STAT TROPONIN I   Imaging Review Dg Chest 2 View  07/31/2013   CLINICAL DATA:  Shortness of breath  EXAM: CHEST  2 VIEW  COMPARISON:  04/06/2013  FINDINGS: The heart size and mediastinal contours are within normal limits. Both lungs are clear. The visualized skeletal structures are unremarkable.  IMPRESSION: No active cardiopulmonary disease.   Electronically Signed   By: Alcide Clever M.D.   On: 07/31/2013 13:33    EKG Interpretation    Date/Time:  Friday July 31 2013 12:23:06 EST Ventricular Rate:  75 PR Interval:  188 QRS Duration: 84 QT Interval:  428 QTC Calculation: 477 R Axis:   62 Text Interpretation:  Normal sinus rhythm ST \\T \ T wave abnormality, consider inferolateral ischemia Prolonged QT Abnormal ECG Confirmed by Amia Rynders  MD, Torrey Ballinas ([id number]) on 07/31/2013 12:37:37 PM            MDM   1. Chest pain   2. Essential hypertension, benign   3. Pure hypercholesterolemia   4. Type II or unspecified type diabetes mellitus without mention of complication, not stated as uncontrolled    Patient sent in for chest pain shortness of breath and EKG changes. Has had some episodes of chest pain and shortness of breath, but no necessarily with exertion. Enzymes are negative. X-ray reassuring. He has a recent heart cath did not show a large vessel disease. Will be admitted to internal medicine for further  evaluation.    Juliet Rude. Rubin Payor, MD 07/31/13 1536

## 2013-07-31 NOTE — Consult Note (Signed)
Patient ID: [name] [name] MRN: [id number] DOB/AGE: [birth year] 57 y.o.  Admit date: 07/31/2013 Primary Cardiologist: Shirlee Latch Reason for Consultation: Chest pain, SOB  HPI: 57 yo male with history of HTN, DM, HLD, atrial fibrillation on chronic coumadin therapy, CAD presented to the ED from primary care office with complaints of chest pain and SOB. EKG shows sinus rhythm with T-wave inversions inferior and anterolateral leads, unchanged from September 2014. He notes episodes of dyspnea at rest over last 2 weeks. He is also having short episodes of sharp chest pain radiating to his left shoulder. No prolonged episodes. He has had frequent episodes of chest pain over the last few years as documented in cardiology office notes by Dr. Shirlee Latch. He also has issues with medication non-compliance. He admits today that he has not been taking his medications as directed. His blood pressure is elevated as a result. The patient had a cardiac cath in May 2013 which showed diffuse distal and branch vessel disease not amenable to intervention. Cardiac MRI done to confirm EF in 5/14 showed EF 47%.  No current chest pain or SOB. Telemetry shows sinus rhythm.   Past Medical History  Diagnosis Date  . Hypertension   . Gout   . Atrial fibrillation     On coumadin  . DIABETES MELLITUS, TYPE II 03/06/2009  . HYPERTENSION, BENIGN ESSENTIAL 04/07/2009  . LIVER FUNCTION TESTS, ABNORMAL, HX OF 12/30/2006  . History of cardiac arrhythmia 12/18/2011    S/p cardioversion ?    . LV dysfunction 12/18/2011    EF 60-65% by echo 12/2011; 2 D echo (2008)Overall left ventricular systolic function was moderately to markedly decreased.Left ventricular ejection fraction was estimated , range being 25% to 35 %.There was severe diffuse left ventricular hypokinesis.     . Rectal bleeding 12/18/2011    Scheduled for colonoscopy.    [name] HEPATITIS B, CHRONIC 12/30/2006  . OSTEOCHONDROMA 12/30/2006     LEFT SHOULDER   . HYPERCHOLESTEROLEMIA  07/11/2010  . MITRAL REGURGITATION 12/30/2006  . COLONIC POLYPS, HX OF 12/30/2006  . Heart murmur   . CAD (coronary artery disease), native coronary artery     Nonobstructive CAD by cath 2013 - diffuse distal and branch vessel CAD, no severe disease in the major coronaries, LV mild global hypokinesis, EF 45%  . Congestive heart failure, unspecified   . H/O atrial flutter 2007    Family History  Problem Relation Age of Onset  . Diabetes Mother   . Hypertension Mother     History   Social History  . Marital Status: Married    Spouse Name: N/A    Number of Children: 2  . Years of Education: N/A   Occupational History  .     Social History Main Topics  . Smoking status: Former Smoker -- 0.20 packs/day for 18 years    Quit date: 08/14/1983  . Smokeless tobacco: Never Used  . Alcohol Use: 7.0 oz/week    14 drink(s) per week  . Drug Use: Yes    Special: Marijuana     Comment: h/o recreational use  . Sexual Activity: Yes   Other Topics Concern  . Not on file   Social History Narrative  . No narrative on file    Past Surgical History  Procedure Laterality Date  . Osteochondroma excision  1972  . Coronary angioplasty  12/05/01  . A flutter ablation  2007, 2008    catheter ablation   . Eye surgery  Allergies  Allergen Reactions  . Other Hives    Patient reports developing hives after receiving "some antibiotic years ago at [facility]". He does not know which antibiotic.    Home Medications:  amLODipine (NORVASC) 10 MG tablet, Take 10 mg by mouth daily., Disp: , Rfl: ;   atorvastatin (LIPITOR) 40 MG tablet, Take 80 mg by mouth daily., Disp: , Rfl: ;   carvedilol (COREG) 25 MG tablet, Take 25 mg by mouth 2 (two) times daily with a meal., Disp: , Rfl: ;   furosemide (LASIX) 40 MG tablet, Take 40 mg by mouth., Disp: , Rfl: ;   hydrALAZINE (APRESOLINE) 50 MG tablet, Take 50 mg by mouth., Disp: , Rfl:  isosorbide mononitrate (IMDUR) 60 MG 24 hr tablet, Take 60  mg by mouth daily. , Disp: , Rfl: ;   losartan (COZAAR) 100 MG tablet, Take 100 mg by mouth daily., Disp: , Rfl: ;   metFORMIN (GLUCOPHAGE) 1000 MG tablet, Take 1,000 mg by mouth., Disp: , Rfl: ;   warfarin (COUMADIN) 5 MG tablet, Take 2.5-5 mg by mouth daily. Take one tablet (5mg ) everyday except Wednesday take 0.5 tablet (2.5 mg)., Disp: , Rfl:  colchicine (COLCRYS) 0.6 MG tablet, Take 0.6 mg by mouth 3 (three) times daily as needed (Gout flare up). , Disp: , Rfl: ;   insulin aspart (NOVOLOG) 100 unit/ml SOLN, Inject 7 Units into the skin 3 (three) times daily with meals. novolog pen, Disp: , Rfl: ;   insulin glargine (LANTUS) 100 UNIT/ML injection, Inject 60 Units into the skin at bedtime. , Disp: , Rfl:  nitroGLYCERIN (NITROSTAT) 0.4 MG SL tablet, Place 1 tablet (0.4 mg total) under the tongue every 5 (five) minutes x 3 doses as needed for chest pain (up to 3 doses)., Disp: 100 tablet, Rfl: 3  Review of systems complete and found to be negative unless listed above   Physical Exam: Blood pressure 177/99, pulse 72, temperature 98.2 F (36.8 C), temperature source Oral, resp. rate 25, height 6' (1.829 m), weight 218 lb (98.884 kg), SpO2 100.00%.    General: Well developed, well nourished, NAD  HEENT: OP clear, mucus membranes moist  SKIN: warm, dry. No rashes.  Neuro: No focal deficits  Musculoskeletal: Muscle strength 5/5 all ext  Psychiatric: Mood and affect normal  Neck: No JVD, no carotid bruits, no thyromegaly, no lymphadenopathy.  Lungs:Clear bilaterally, no wheezes, rhonci, crackles  Cardiovascular: Regular rate and rhythm. No murmurs, gallops or rubs.  Abdomen:Soft. Bowel sounds present. Non-tender.  Extremities: No lower extremity edema. Pulses are 2 + in the bilateral DP/PT.  Labs:   Lab Results  Component Value Date   WBC 6.1 07/31/2013   HGB 14.7 07/31/2013   HCT 44.0 07/31/2013   MCV 82.6 07/31/2013   PLT 144* 07/31/2013    Recent Labs Lab 07/31/13 1304  NA 138    K 3.8  CL 105  CO2 23  BUN 15  CREATININE 1.09  CALCIUM 8.9  GLUCOSE 144*   POCT Troponin: 0.02   Echo 12/16/12: Left ventricle: Inferobasal wall hypokinesis The cavity size was mildly dilated. Wall thickness was increased in a pattern of mild LVH. Systolic function was normal. The estimated ejection fraction was in the range of 50% to 55%. - Aortic valve: Trivial regurgitation. - Mitral valve: Mild regurgitation. - Left atrium: The atrium was mildly dilated.  Chest x-ray:  The heart size and mediastinal contours are within normal limits.  Both lungs are clear. The visualized  skeletal structures are  unremarkable.  IMPRESSION:  No active cardiopulmonary disease.  EKG: NSR, rate 75 bpm. T-wave inversion with subtle ST depression inferior and anterolateral leads, unchanged from EKG dated 05/05/13. QTc 477 msec.   ASSESSMENT AND PLAN:   1. Chest pain: His chest pains are consistent with the pain that he has had over the last several years. Cath in 2013 with diffuse CAD. Stress myoview May 2014 without ischemia. He could certainly have angina given his underlying CAD and accelerated HTN. He has been non-compliant with medications. Would monitor overnight on telemetry. Cycle cardiac markers. EKG in am. If he rules out, would discharge home tomorrow with plans for early f/u with Dr. Shirlee Latch.   2. CAD: continue current medical management.   3. Atrial fibrillation: He has paroxysmal atrial fibrillation. Currently in sinus rhythm. Continue coumadin therapy.   4. HTN, uncontrolled: Will need to have medications restarted and titrated for optimal BP control before discharge. His accelerated HTN is likely contributing to his presenting symptoms.    Signed: Estephanie Hubbs 07/31/2013, 4:16 PM

## 2013-07-31 NOTE — Progress Notes (Signed)
Unit CM UR Completed by [facility] ED CM  W. Brieonna Crutcher RN  

## 2013-07-31 NOTE — Progress Notes (Signed)
ANTICOAGULATION CONSULT NOTE - Initial Consult  Pharmacy Consult for warfarin Indication: atrial fibrillation  Allergies  Allergen Reactions  . Other Hives    Patient reports developing hives after receiving "some antibiotic years ago at [facility]". He does not know which antibiotic.    Patient Measurements: Height: 6' (182.9 cm) Weight: 218 lb (98.884 kg) IBW/kg (Calculated) : 77.6  Vital Signs: Temp: 98.2 F (36.8 C) (12/19 1218) Temp src: Oral (12/19 1218) BP: 177/99 mmHg (12/19 1500) Pulse Rate: 72 (12/19 1500)  Labs:  Recent Labs  07/31/13 1304 07/31/13 1454  HGB 14.7  --   HCT 44.0  --   PLT 144*  --   LABPROT  --  16.5*  INR  --  1.37  CREATININE 1.09  --     Estimated Creatinine Clearance: 88.9 ml/min (by C-G formula based on Cr of 1.09).   Medical History: Past Medical History  Diagnosis Date  . Hypertension   . Gout   . Atrial fibrillation     On coumadin  . DIABETES MELLITUS, TYPE II 03/06/2009  . HYPERTENSION, BENIGN ESSENTIAL 04/07/2009  . LIVER FUNCTION TESTS, ABNORMAL, HX OF 12/30/2006  . History of cardiac arrhythmia 12/18/2011    S/p cardioversion ?    . LV dysfunction 12/18/2011    EF 60-65% by echo 12/2011; 2 D echo (2008)Overall left ventricular systolic function was moderately to markedly decreased.Left ventricular ejection fraction was estimated , range being 25% to 35 %.There was severe diffuse left ventricular hypokinesis.     . Rectal bleeding 12/18/2011    Scheduled for colonoscopy.    [name] HEPATITIS B, CHRONIC 12/30/2006  . OSTEOCHONDROMA 12/30/2006     LEFT SHOULDER   . HYPERCHOLESTEROLEMIA 07/11/2010  . MITRAL REGURGITATION 12/30/2006  . COLONIC POLYPS, HX OF 12/30/2006  . Heart murmur   . CAD (coronary artery disease), native coronary artery     Nonobstructive CAD by cath 2013 - diffuse distal and branch vessel CAD, no severe disease in the major coronaries, LV mild global hypokinesis, EF 45%  . Congestive heart failure,  unspecified   . H/O atrial flutter 2007   Assessment: 57 year old male with hx of afib on chronic coumadin. INR subtherapeutic on home dose of 5mg  daily except 2.5mg  on Wednesday. No bleeding issues noted on admission. Will give a little extra tonight.  Goal of Therapy:  INR 2-3 Monitor platelets by anticoagulation protocol: Yes   Plan:  Warfarin 7.5mg  tonight Daily INR for now  Sheppard Coil PharmD., BCPS Clinical Pharmacist Pager [phone] 07/31/2013 4:56 PM

## 2013-07-31 NOTE — H&P (Signed)
[facility] History and Physical  [name] ZOX:[id number] DOB: 07-21-1956 DOA: 07/31/2013  Referring physician:  PCP: Jeanann Lewandowsky, MD  Specialists:   Chief Complaint: Chest pain and shortness of breath  HPI: [name] [name] is a 57 y.o. male  With a history of hypertension, diabetes, hyperlipidemia, atrial fibrillation requiring anticoagulation, coronary artery disease, cardiac cath in May of 2013 that presents with chest pain and shortness of breath.  Patient states over the past week his chest pain has been worsening along with his shortness of breath. He does state his chest pain lasts a few seconds. The patient is not necessarily doing anything on the chest pain occurs. The left side of his chest which radiates to the left shoulder. He denies any dizziness or nausea or diaphoresis with his pain. Patient also complains of some shortness of breath which occurs with exertion at times. Patient admits to being noncompliant with his medications. Patient is also stated he's had a cardiac cath in the past, but does not recall the outcome of that cath.  Review of Systems:  Constitutional: Denies fever, chills, diaphoresis, appetite change and fatigue.  HEENT: Denies photophobia, eye pain, redness, hearing loss, ear pain, congestion, sore throat, rhinorrhea, sneezing, mouth sores, trouble swallowing, neck pain, neck stiffness and tinnitus.   Respiratory: Denies cough, and wheezing.  Admits to shortness of breath. Cardiovascular: Denies  palpitations and leg swelling.  Admits to chest pain.  Gastrointestinal: Denies nausea, vomiting, abdominal pain, diarrhea, constipation, blood in stool and abdominal distention.  Genitourinary: Denies dysuria, urgency, frequency, hematuria, flank pain and difficulty urinating.  Musculoskeletal: Denies myalgias, back pain, joint swelling, arthralgias and gait problem.  Skin: Denies pallor, rash and wound.  Neurological: Denies dizziness, seizures,  syncope, weakness, light-headedness, numbness and headaches.  Hematological: Denies adenopathy. Easy bruising, personal or family bleeding history  Psychiatric/Behavioral: Denies suicidal ideation, mood changes, confusion, nervousness, sleep disturbance and agitation  Past Medical History  Diagnosis Date  . Hypertension   . Gout   . Atrial fibrillation     On coumadin  . DIABETES MELLITUS, TYPE II 03/06/2009  . HYPERTENSION, BENIGN ESSENTIAL 04/07/2009  . LIVER FUNCTION TESTS, ABNORMAL, HX OF 12/30/2006  . History of cardiac arrhythmia 12/18/2011    S/p cardioversion ?    . LV dysfunction 12/18/2011    EF 60-65% by echo 12/2011; 2 D echo (2008)Overall left ventricular systolic function was moderately to markedly decreased.Left ventricular ejection fraction was estimated , range being 25% to 35 %.There was severe diffuse left ventricular hypokinesis.     . Rectal bleeding 12/18/2011    Scheduled for colonoscopy.    [name] HEPATITIS B, CHRONIC 12/30/2006  . OSTEOCHONDROMA 12/30/2006     LEFT SHOULDER   . HYPERCHOLESTEROLEMIA 07/11/2010  . MITRAL REGURGITATION 12/30/2006  . COLONIC POLYPS, HX OF 12/30/2006  . Heart murmur   . CAD (coronary artery disease), native coronary artery     Nonobstructive CAD by cath 2013 - diffuse distal and branch vessel CAD, no severe disease in the major coronaries, LV mild global hypokinesis, EF 45%  . Congestive heart failure, unspecified   . H/O atrial flutter 2007   Past Surgical History  Procedure Laterality Date  . Osteochondroma excision  1972  . Coronary angioplasty  12/05/01  . A flutter ablation  2007, 2008    catheter ablation   . Eye surgery     Social History:  reports that he quit smoking about 29 years ago. He has never used smokeless tobacco. He reports  that he drinks about 7.0 ounces of alcohol per week. He reports that he uses illicit drugs (Marijuana). Patient was at home alone. He is a recent widower, his wife passed in September.  Allergies   Allergen Reactions  . Other Hives    Patient reports developing hives after receiving "some antibiotic years ago at [facility]". He does not know which antibiotic.    Family History  Problem Relation Age of Onset  . Diabetes Mother   . Hypertension Mother     Prior to Admission medications   Medication Sig Start Date End Date Taking? Authorizing Provider  amLODipine (NORVASC) 10 MG tablet Take 10 mg by mouth daily.   Yes Historical Provider, MD  atorvastatin (LIPITOR) 40 MG tablet Take 80 mg by mouth daily. 05/05/13  Yes [name], MD  carvedilol (COREG) 25 MG tablet Take 25 mg by mouth 2 (two) times daily with a meal.   Yes Historical Provider, MD  furosemide (LASIX) 40 MG tablet Take 40 mg by mouth.   Yes Historical Provider, MD  hydrALAZINE (APRESOLINE) 50 MG tablet Take 50 mg by mouth.   Yes Historical Provider, MD  isosorbide mononitrate (IMDUR) 60 MG 24 hr tablet Take 60 mg by mouth daily.  05/05/13  Yes [name], MD  losartan (COZAAR) 100 MG tablet Take 100 mg by mouth daily.   Yes Historical Provider, MD  metFORMIN (GLUCOPHAGE) 1000 MG tablet Take 1,000 mg by mouth.   Yes Historical Provider, MD  warfarin (COUMADIN) 5 MG tablet Take 2.5-5 mg by mouth daily. Take one tablet (5mg ) everyday except Wednesday take 0.5 tablet (2.5 mg).   Yes Historical Provider, MD  colchicine (COLCRYS) 0.6 MG tablet Take 0.6 mg by mouth 3 (three) times daily as needed (Gout flare up).     Historical Provider, MD  insulin aspart (NOVOLOG) 100 unit/ml SOLN Inject 7 Units into the skin 3 (three) times daily with meals. novolog pen    Historical Provider, MD  insulin glargine (LANTUS) 100 UNIT/ML injection Inject 60 Units into the skin at bedtime.  07/17/13   Jeanann Lewandowsky, MD  nitroGLYCERIN (NITROSTAT) 0.4 MG SL tablet Place 1 tablet (0.4 mg total) under the tongue every 5 (five) minutes x 3 doses as needed for chest pain (up to 3 doses). 08/22/12 08/22/13  [name], MD    Physical Exam: Filed Vitals:   07/31/13 1500  BP: 177/99  Pulse: 72  Temp:   Resp: 25     General: Well developed, well nourished, NAD, appears stated age  HEENT: NCAT, PERRLA, EOMI, Anicteic Sclera, mucous membranes moist. No pharyngeal erythema or exudates  Neck: Supple, no JVD, no masses  Cardiovascular: S1 S2 auscultated, no rubs, murmurs or gallops. Regular rate and rhythm. No pain elicited with palpation of the chest wall.  Respiratory: Clear to auscultation bilaterally with equal chest rise  Abdomen: Soft, nontender, nondistended, + bowel sounds  Extremities: warm dry without cyanosis clubbing or edema  Neuro: AAOx3, cranial nerves grossly intact. Strength 5/5 in patient's upper and lower extremities bilaterally  Skin: Without rashes exudates or nodules  Psych: Normal affect and demeanor with intact judgement and insight  Labs on Admission:  Basic Metabolic Panel:  Recent Labs Lab 07/31/13 1304  NA 138  K 3.8  CL 105  CO2 23  GLUCOSE 144*  BUN 15  CREATININE 1.09  CALCIUM 8.9   Liver Function Tests: No results found for this basename: AST, ALT, ALKPHOS, BILITOT, PROT, ALBUMIN,  in the last 168 hours No results found for this basename: LIPASE, AMYLASE,  in the last 168 hours No results found for this basename: AMMONIA,  in the last 168 hours CBC:  Recent Labs Lab 07/31/13 1304  WBC 6.1  HGB 14.7  HCT 44.0  MCV 82.6  PLT 144*   Cardiac Enzymes: No results found for this basename: CKTOTAL, CKMB, CKMBINDEX, TROPONINI,  in the last 168 hours  BNP (last 3 results)  Recent Labs  08/22/12 1130 12/15/12 1230 07/31/13 1304  PROBNP 19.0 168.7* 277.4*   CBG: No results found for this basename: GLUCAP,  in the last 168 hours  Radiological Exams on Admission: Dg Chest 2 View  07/31/2013   CLINICAL DATA:  Shortness of breath  EXAM: CHEST  2 VIEW  COMPARISON:  04/06/2013  FINDINGS: The heart size and mediastinal contours are within normal  limits. Both lungs are clear. The visualized skeletal structures are unremarkable.  IMPRESSION: No active cardiopulmonary disease.   Electronically Signed   By: Alcide Clever M.D.   On: 07/31/2013 13:33    EKG: Independently reviewed. Sinus rhythm rate of 75, LVH, nonspecific ST changes  Assessment/Plan Active Problems:   DIABETES MELLITUS, TYPE II   HYPERCHOLESTEROLEMIA   GOUT   HYPERTENSION, BENIGN ESSENTIAL   Chest pain   Chronic systolic CHF (congestive heart failure)   Atrial fibrillation   Chest pain Will admit patient to telemetry unit for observation. Patient does have known history of coronary artery disease given cardiac cath in May 2013 which did show a diffuse distal and branch vessel disease which was not amenable to intervention.  Patient did have echocardiogram conducted 12/16/2012 which showed left ventricular wall thickness increased with moderate LVH, systolic function was normal with an EF of 60-65%. Will obtain fasting lipid panel, magnesium, phosphate, TSH level. Will place patient on oxygen to maintain his saturations were 92% as well as morphine for pain control. We'll continue his amlodipine, carvedilol, atorvastatin, losartan. Patient is currently on warfarin therefore will not use aspirin at this time. Will consult cardiology for further evaluation and treatment.  Accelerated hypertension Patient does admit to being noncompliant with his medications. Will continue him on hydralazine, Coreg, amlodipine, losartan. Will ask cardiology to maximize his medication regimen.  Dyspnea Patient has been short of breath for the last week. This has been increasing. This may be secondary to his chest pain as well as accelerated hypertension. Unlikely an exacerbation of his CHF. Will continue to monitor and place patient on oxygen as needed. His oxygen saturations are between 95-100%.  Diabetes mellitus type 2 Will continue patient on his Lantus as well as NovoLog as well as  addition of insulin sliding scale with CBG monitoring. Will also obtain hemoglobin A1c  Atrial fibrillation with subtherapeutic INR Will have pharmacy to follow and dose his warfarin levels. Patient is currently rate controlled. INR currently 1.37.  Chronic systolic CHF His last documented EF was normal at 60-65%.  BNP was found to be 277. He should appears euvolemic and not to be in exacerbation at this time. Will place patient on daily weights, strict intake and output, fluid restriction. Will continue patient on his Lasix.  Gout Currently stable continue colchicine.  Medication Noncompliance Counseled patient on the need to be compliant with medications. Will also contact case management for a financial assistance.  DVT prophylaxis: Heparin  Code Status: Full  Condition: Guarded  Family Communication: Daughters at bedside Admission, patients condition and plan of care including tests  being ordered have been discussed with the patient and daughters who indicate understanding and agree with the plan and Code Status.  Disposition Plan: Admitted for observation.  Time spent: 50 minutes  Deshana Rominger D.O. [facility] Pager [phone]  If 7PM-7AM, please contact night-coverage [email or web address] Password [email or web address] 07/31/2013, 4:22 PM

## 2013-07-31 NOTE — ED Notes (Signed)
Patient transported to X-ray 

## 2013-07-31 NOTE — ED Notes (Signed)
Pt reports that he was across the at [facility] for a check up. Reports that he had EKG changes at the office. States that he has been tired over the past couple of days and SOB. Denies any chest pain at this time. Reports a cardiac hx.

## 2013-07-31 NOTE — ED Notes (Signed)
MD at bedside. 

## 2013-08-01 DIAGNOSIS — I509 Heart failure, unspecified: Secondary | ICD-10-CM

## 2013-08-01 DIAGNOSIS — I5022 Chronic systolic (congestive) heart failure: Secondary | ICD-10-CM | POA: Diagnosis not present

## 2013-08-01 DIAGNOSIS — I4891 Unspecified atrial fibrillation: Secondary | ICD-10-CM

## 2013-08-01 DIAGNOSIS — R079 Chest pain, unspecified: Secondary | ICD-10-CM | POA: Diagnosis not present

## 2013-08-01 LAB — HEMOGLOBIN A1C
Hgb A1c MFr Bld: 7.4 % — ABNORMAL HIGH (ref <5.7)
Mean Plasma Glucose: 166 mg/dL — ABNORMAL HIGH (ref <117)

## 2013-08-01 LAB — GLUCOSE, CAPILLARY: Glucose-Capillary: 165 mg/dL — ABNORMAL HIGH (ref 70–99)

## 2013-08-01 LAB — TROPONIN I
Troponin I: <0.3 ng/mL (ref <0.30)
Troponin I: <0.3 ng/mL (ref <0.30)

## 2013-08-01 LAB — PROTIME-INR: Prothrombin Time: 14.8 seconds (ref 11.6–15.2)

## 2013-08-01 LAB — TSH: TSH: 1.692 u[IU]/mL (ref 0.350–4.500)

## 2013-08-01 MED ORDER — HYDRALAZINE HCL 50 MG PO TABS: 50.0000 mg | ORAL_TABLET | Freq: Three times a day (TID) | ORAL | Status: DC

## 2013-08-01 NOTE — Progress Notes (Signed)
Pt states he does not really follow a good diabetic diet - takes 7 u regularly with his meal but already has sliding scale coverage this am declined meal coverage. Says he does not regularly check his sugar. Financially having a hard time buying strips or meter does not work. Daughters worried about dad asked to call to case manager or socail services for financial help with meds or new meter.

## 2013-08-01 NOTE — Progress Notes (Signed)
D/C per w/c with all belongings. Accompanied by 2 daughters and nurse tech. Pt has all teaching material and info from Child psychotherapist. Prescription has been e sent to pharmacy.

## 2013-08-01 NOTE — Progress Notes (Signed)
Utilization Review completed.  

## 2013-08-01 NOTE — Progress Notes (Signed)
Patient ID: [name] [name], male   DOB: March 24, 1956, 57 y.o.   MRN: [id number]    Primary cardiologist:  Subjective:    No chest pain overnight, feels well this morning.   Objective:   Temp:  [97.8 F (36.6 C)-98.5 F (36.9 C)] 97.8 F (36.6 C) (12/20 0645) Pulse Rate:  [58-84] 80 (12/20 0645) Resp:  [16-25] 20 (12/20 0645) BP: (143-197)/(86-110) 143/86 mmHg (12/20 0645) SpO2:  [95 %-100 %] 96 % (12/20 0645) Weight:  [217 lb (98.431 kg)-218 lb 9.6 oz (99.156 kg)] 217 lb (98.431 kg) (12/20 0645) Last BM Date: 07/31/13  Filed Weights   07/31/13 1218 07/31/13 1835 08/01/13 0645  Weight: 218 lb (98.884 kg) 218 lb 7.6 oz (99.1 kg) 217 lb (98.431 kg)    Intake/Output Summary (Last 24 hours) at 08/01/13 0710 Last data filed at 08/01/13 0646  Gross per 24 hour  Intake      0 ml  Output    850 ml  Net   -850 ml    Telemetry:No events  Exam:  General:NAD  Resp: CTAB  Cardiac: RRR, no m/r/g, no JVD, no carotid bruits  GI: soft, NT, ND  MSK: extremities warm, no edema  Neuro: no focal deficits    Lab Results:  Basic Metabolic Panel:  Recent Labs Lab 07/31/13 1304 07/31/13 2209  NA 138  --   K 3.8  --   CL 105  --   CO2 23  --   GLUCOSE 144*  --   BUN 15  --   CREATININE 1.09  --   CALCIUM 8.9  --   MG  --  2.1    Liver Function Tests: No results found for this basename: AST, ALT, ALKPHOS, BILITOT, PROT, ALBUMIN,  in the last 168 hours  CBC:  Recent Labs Lab 07/31/13 1304  WBC 6.1  HGB 14.7  HCT 44.0  MCV 82.6  PLT 144*    Cardiac Enzymes:  Recent Labs Lab 07/31/13 2210 08/01/13 0024  TROPONINI <0.30 <0.30    BNP:  Recent Labs  08/22/12 1130 12/15/12 1230 07/31/13 1304  PROBNP 19.0 168.7* 277.4*    Coagulation:  Recent Labs Lab 07/31/13 1454  INR 1.37    ECG:   Medications:   Scheduled Medications: . amLODipine  10 mg Oral Daily  . atorvastatin  80 mg Oral Daily  . carvedilol  25 mg Oral BID WC  . furosemide   40 mg Oral Daily  . heparin  5,000 Units Subcutaneous Q8H  . hydrALAZINE  50 mg Oral Q8H  . influenza vac split quadrivalent PF  0.5 mL Intramuscular Tomorrow-1000  . insulin aspart  0-15 Units Subcutaneous TID WC  . insulin aspart  0-5 Units Subcutaneous QHS  . insulin aspart  7 Units Subcutaneous TID WC  . insulin glargine  60 Units Subcutaneous QHS  . isosorbide mononitrate  60 mg Oral Daily  . losartan  100 mg Oral Daily  . Warfarin - Pharmacist Dosing Inpatient   Does not apply q1800     Infusions:     PRN Medications:  acetaminophen, colchicine, morphine injection, nitroGLYCERIN, ondansetron (ZOFRAN) IV     Assessment/Plan    57 yo male HTN, DM, HL, afib on coumain, CAD admitted with chest pain and SOB.    1. Chest pain - several year history of similar cardiac chest pain - prior cardiac cath May 2013 with diffuse distal disease not amenable to intervention, LVEF 47% by cardiac MRI previously. Negative stress MPI  for ischemia 12/2012.  - no ischemic EKG changes, old ST/T changes from prior EKG - troponins negative x 3, CXR no acute process - encouraged medication compliance - patient may be discharged home, he should follow up with Dr Jearld Pies in 3-4 weeks. Resume home medications.   2. Afib - continue coumadin  3. HTN - restart home medications  4. Hyperlipidemia - at goal, continue current statin   Dina Rich, M.D., F.A.C.C.

## 2013-08-01 NOTE — Progress Notes (Signed)
   CARE MANAGEMENT NOTE 08/01/2013  Patient:  [name] [name], [name] [name]   Account Number:  [id number]  Date Initiated:  08/01/2013  Documentation initiated by:  [facility]  Subjective/Objective Assessment:   adm: chest pain     Action/Plan:   discharge planning   Anticipated DC Date:  08/01/2013   Anticipated DC Plan:  HOME/SELF CARE      DC Planning Services  Medication Assistance  CM consult      Choice offered to / List presented to:             Status of service:  Completed, signed off Medicare Important Message given?   (If response is "NO", the following Medicare IM given date fields will be blank) Date Medicare IM given:   Date Additional Medicare IM given:    Discharge Disposition:  HOME/SELF CARE  Per UR Regulation:    If discussed at Long Length of Stay Meetings, dates discussed:    Comments:  08/01/13 08:30 CM discussed insurance with pt in room and pt states he has no drug plan or supplemental insurance with his Medicare part A and B.  Pt was given handout from [employer] to fill out to get interim help with antibiotics and diabetic medication.  Pt was also given [employer] handout on $4 meds and information on ReLion glucometer.  Pt and pt's daughter states they have tried to get better insurance through the affordable care website but find it difficult to navigate.  CM gave pt and daughter [employer] [facility] Intake number to make an appt with a navigator to get an insurance plan to cover chronic disease (Diabetes) medications.  No other CM needs were communicated.  Freddy Jaksch, BSN, CM [phone].

## 2013-08-01 NOTE — Discharge Summary (Signed)
Physician Discharge Summary  [name] [name] UJW:[id number] DOB: 02/28/1956 DOA: 07/31/2013  PCP: Jeanann Lewandowsky, MD  Admit date: 07/31/2013 Discharge date: 08/01/2013  Time spent: 45 minutes  Recommendations for Outpatient Follow-up:  Patient be discharged to home. He is to followup with his primary care physician within one week of discharge. Patient will also need to have his Coumadin/INR check on Monday, December 22. Patient should also follow up with Dr. Shirlee Latch within 3-4 weeks of discharge. Patient to continue taking his medications as prescribed, and follow a low-sodium cardiac healthy/carb modified diet.   Discharge Diagnoses:  Active Problems:   DIABETES MELLITUS, TYPE II   HYPERCHOLESTEROLEMIA   GOUT   HYPERTENSION, BENIGN ESSENTIAL   Chest pain   Chronic systolic CHF (congestive heart failure)   Atrial fibrillation   HTN (hypertension)   Discharge Condition: Stable  Diet recommendation: Cardiac healthy/carb modified  Filed Weights   07/31/13 1218 07/31/13 1835 08/01/13 0645  Weight: 98.884 kg (218 lb) 99.1 kg (218 lb 7.6 oz) 98.431 kg (217 lb)    History of present illness:  With a history of hypertension, diabetes, hyperlipidemia, atrial fibrillation requiring anticoagulation, coronary artery disease, cardiac cath in May of 2013 that presents with chest pain and shortness of breath. Patient states over the past week his chest pain has been worsening along with his shortness of breath. He does state his chest pain lasts a few seconds. The patient is not necessarily doing anything on the chest pain occurs. The left side of his chest which radiates to the left shoulder. He denies any dizziness or nausea or diaphoresis with his pain. Patient also complains of some shortness of breath which occurs with exertion at times. Patient admits to being noncompliant with his medications. Patient is also stated he's had a cardiac cath in the past, but does not recall the outcome of  that cath.  Hospital Course:  This is a 57 year old male with a history of hypertension, diabetes, hyperlipidemia, known coronary artery disease that presented to the emergency department for chest pain and shortness of breath. Patient was also found to have accelerated hypertension. Patient does admit to medication noncompliance in appearance. Patient's blood pressure was noted to be 197/88 upon admission. Patient was placed on home medications and counseled on the need for medical compliance with his medications. Patient was admitted for chest pain rule out. He did have a similar history approximately one year ago. He underwent a cardiac catheterization in May of 2013 which showed diffuse distal disease which has not amenable to intervention. Patient also had a negative stress MPI for ischemia in May of 2014. His cardiac enzymes were cycled and found to be negative x3. Again patient was encouraged to medical compliance. His chest pain may have been secondary to uncontrolled blood pressure. Patient did have a fasting lipid panel conducted showing a total cholesterol of 143, triglycerides 168, HDL 46, LDL 63. His magnesium and phosphate levels also found to be within normal limits. His TSH as well as hemoglobin A1c are still pending and should be followed. Patient will need to followup with Dr. Shirlee Latch within 3-4 weeks. As for patient's accelerated hypertension, patient was placed in all of his home medications at the appropriate doses and frequencies. His blood pressure did trend downward to 124/66. As her patient shortness of breath this was likely secondary to his accelerated hypertension. Patient does have a history of chronic systolic CHF however his BNP was found to be 277 and patient did appear euvolemic. Patient was  found to have a subtherapeutic INR of 1.37. He was continued on warfarin. Patient should have his INR checked on Monday, 08/03/2013. Patient to continue all his medications as prescribed. This  was discussed with the patient he does understand agree. Again the patient will follow up with Dr. Shirlee Latch within 3-4 weeks of discharge, he will also followup with his primary care physician within one week of discharge. He will need to have his INR checked within 2-3 days of discharge. Patient should follow a low-sodium cardiac healthy and carb modified diet. He should also limit himself to 1500-1800 mL's of fluid per day.  Procedures: None  Consultations: Cardiology  Discharge Exam: Filed Vitals:   08/01/13 0840  BP: 124/66  Pulse:   Temp:   Resp:    General: Well developed, well nourished, NAD, appears stated age  HEENT: NCAT, PERRLA, EOMI, Anicteic Sclera, mucous membranes moist. No pharyngeal erythema or exudates  Neck: Supple, no JVD, no masses  Cardiovascular: S1 S2 auscultated, no rubs, murmurs or gallops. Regular rate and rhythm. No pain elicited with palpation of the chest wall.  Respiratory: Clear to auscultation bilaterally with equal chest rise  Abdomen: Soft, nontender, nondistended, + bowel sounds  Extremities: warm dry without cyanosis clubbing or edema  Neuro: AAOx3, cranial nerves grossly intact. Strength 5/5 in patient's upper and lower extremities bilaterally  Skin: Without rashes exudates or nodules  Psych: Normal affect and demeanor with intact judgement and insight  Discharge Instructions  Discharge Orders   Future Appointments Provider Department Dept Phone   08/27/2013 11:00 AM [facility] [place] Office [phone]   Future Orders Complete By Expires   Diet - low sodium heart healthy  As directed    Discharge instructions  As directed    Comments:     Patient be discharged to home. He is to followup with his primary care physician within one week of discharge. Patient will also need to have his Coumadin/INR check on Monday, December 22. Patient should also follow up with Dr. Shirlee Latch within 3-4 weeks of discharge. Patient to  continue taking his medications as prescribed, and follow a low-sodium cardiac healthy/carb modified diet.   Increase activity slowly  As directed        Medication List         amLODipine 10 MG tablet  Commonly known as:  NORVASC  Take 10 mg by mouth daily.     atorvastatin 40 MG tablet  Commonly known as:  LIPITOR  Take 80 mg by mouth daily.     carvedilol 25 MG tablet  Commonly known as:  COREG  Take 25 mg by mouth 2 (two) times daily with a meal.     COLCRYS 0.6 MG tablet  Generic drug:  colchicine  Take 0.6 mg by mouth 3 (three) times daily as needed (Gout flare up).     furosemide 40 MG tablet  Commonly known as:  LASIX  Take 40 mg by mouth.     hydrALAZINE 50 MG tablet  Commonly known as:  APRESOLINE  Take 1 tablet (50 mg total) by mouth every 8 (eight) hours.     insulin aspart 100 unit/ml Soln  Commonly known as:  novoLOG  Inject 7 Units into the skin 3 (three) times daily with meals. novolog pen     insulin glargine 100 UNIT/ML injection  Commonly known as:  LANTUS  Inject 60 Units into the skin at bedtime.     isosorbide mononitrate 60 MG  24 hr tablet  Commonly known as:  IMDUR  Take 60 mg by mouth daily.     losartan 100 MG tablet  Commonly known as:  COZAAR  Take 100 mg by mouth daily.     metFORMIN 1000 MG tablet  Commonly known as:  GLUCOPHAGE  Take 1,000 mg by mouth.     nitroGLYCERIN 0.4 MG SL tablet  Commonly known as:  NITROSTAT  Place 1 tablet (0.4 mg total) under the tongue every 5 (five) minutes x 3 doses as needed for chest pain (up to 3 doses).     warfarin 5 MG tablet  Commonly known as:  COUMADIN  Take 2.5-5 mg by mouth daily. Take one tablet (5mg ) everyday except Wednesday take 0.5 tablet (2.5 mg).       Allergies  Allergen Reactions  . Other Hives    Patient reports developing hives after receiving "some antibiotic years ago at [facility]". He does not know which antibiotic.       Follow-up Information    Follow up with Jeanann Lewandowsky, MD. Schedule an appointment as soon as possible for a visit in 1 week.   Specialty:  Internal Medicine   Contact information:   [address] [place] [address] [phone]       Follow up with Marca Ancona, MD. Schedule an appointment as soon as possible for a visit in 3 weeks.   Specialty:  Cardiology   Contact information:   [address] [place] [address] [place] [address] [phone]        The results of significant diagnostics from this hospitalization (including imaging, microbiology, ancillary and laboratory) are listed below for reference.    Significant Diagnostic Studies: Dg Chest 2 View  07/31/2013   CLINICAL DATA:  Shortness of breath  EXAM: CHEST  2 VIEW  COMPARISON:  04/06/2013  FINDINGS: The heart size and mediastinal contours are within normal limits. Both lungs are clear. The visualized skeletal structures are unremarkable.  IMPRESSION: No active cardiopulmonary disease.   Electronically Signed   By: Alcide Clever M.D.   On: 07/31/2013 13:33    Microbiology: No results found for this or any previous visit (from the past 240 hour(s)).   Labs: Basic Metabolic Panel:  Recent Labs Lab 07/31/13 1304 07/31/13 2209  NA 138  --   K 3.8  --   CL 105  --   CO2 23  --   GLUCOSE 144*  --   BUN 15  --   CREATININE 1.09  --   CALCIUM 8.9  --   MG  --  2.1  PHOS  --  2.6   Liver Function Tests: No results found for this basename: AST, ALT, ALKPHOS, BILITOT, PROT, ALBUMIN,  in the last 168 hours No results found for this basename: LIPASE, AMYLASE,  in the last 168 hours No results found for this basename: AMMONIA,  in the last 168 hours CBC:  Recent Labs Lab 07/31/13 1304  WBC 6.1  HGB 14.7  HCT 44.0  MCV 82.6  PLT 144*   Cardiac Enzymes:  Recent Labs Lab 07/31/13 2210 08/01/13 0024 08/01/13 0730  TROPONINI <0.30 <0.30 <0.30   BNP: BNP (last 3 results)  Recent Labs  08/22/12 1130  12/15/12 1230 07/31/13 1304  PROBNP 19.0 168.7* 277.4*   CBG:  Recent Labs Lab 07/31/13 1846 07/31/13 2125 08/01/13 0635  GLUCAP 186* 102* 165*       Signed:  Clarinda Obi  [facility] 08/01/2013, 9:45 AM

## 2013-08-01 NOTE — Progress Notes (Signed)
Call to social worker and told of pt's financial worries. Says she will check into pt's resources and get back with pt.

## 2013-08-01 NOTE — Progress Notes (Signed)
Went over all discharge instructions including current meds and follow up appts. 2 Daughters in the room appear very concerned about their father and helpful. Provided exit care notes on Diabetic diet, carb counting, and " new Plate" for how to eat. Discussed importance of checking blood sugar prior to each insulin injection and gave recording sheet. More notes on how to eat when eating out and sick day management. Pt had made mention that since he had a Dr appt yesterday at 1100 wasn't sure if he should take his BP meds and decided not to. Given handout on how to take hypertensive meds, diet Na control and why he should take his meds same time everyday and not skipping meds  Even when he feels fine.

## 2013-08-27 ENCOUNTER — Ambulatory Visit (INDEPENDENT_AMBULATORY_CARE_PROVIDER_SITE_OTHER): Payer: Medicare Other | Admitting: Pharmacist

## 2013-08-27 DIAGNOSIS — I4891 Unspecified atrial fibrillation: Secondary | ICD-10-CM | POA: Diagnosis not present

## 2013-08-27 DIAGNOSIS — Z5181 Encounter for therapeutic drug level monitoring: Secondary | ICD-10-CM | POA: Diagnosis not present

## 2013-08-27 DIAGNOSIS — Z7901 Long term (current) use of anticoagulants: Secondary | ICD-10-CM

## 2013-08-27 LAB — POCT INR: INR: 2.8

## 2013-09-05 ENCOUNTER — Emergency Department (HOSPITAL_COMMUNITY)
Admission: EM | Admit: 2013-09-05 | Discharge: 2013-09-05 | Disposition: A | Discharge status: Home / Self Care | Payer: Medicare Other | Attending: Emergency Medicine | Admitting: Emergency Medicine

## 2013-09-05 ENCOUNTER — Emergency Department (HOSPITAL_COMMUNITY): Payer: Medicare Other

## 2013-09-05 ENCOUNTER — Encounter (HOSPITAL_COMMUNITY): Payer: Self-pay | Admitting: Emergency Medicine

## 2013-09-05 DIAGNOSIS — Z794 Long term (current) use of insulin: Secondary | ICD-10-CM | POA: Diagnosis not present

## 2013-09-05 DIAGNOSIS — H60399 Other infective otitis externa, unspecified ear: Secondary | ICD-10-CM | POA: Insufficient documentation

## 2013-09-05 DIAGNOSIS — Z87891 Personal history of nicotine dependence: Secondary | ICD-10-CM | POA: Insufficient documentation

## 2013-09-05 DIAGNOSIS — Z7901 Long term (current) use of anticoagulants: Secondary | ICD-10-CM | POA: Insufficient documentation

## 2013-09-05 DIAGNOSIS — R05 Cough: Secondary | ICD-10-CM | POA: Diagnosis not present

## 2013-09-05 DIAGNOSIS — R011 Cardiac murmur, unspecified: Secondary | ICD-10-CM | POA: Insufficient documentation

## 2013-09-05 DIAGNOSIS — E7800 Pure hypercholesterolemia, unspecified: Secondary | ICD-10-CM | POA: Diagnosis not present

## 2013-09-05 DIAGNOSIS — I2510 Atherosclerotic heart disease of native coronary artery without angina pectoris: Secondary | ICD-10-CM | POA: Diagnosis not present

## 2013-09-05 DIAGNOSIS — I4891 Unspecified atrial fibrillation: Secondary | ICD-10-CM | POA: Diagnosis not present

## 2013-09-05 DIAGNOSIS — Z9861 Coronary angioplasty status: Secondary | ICD-10-CM | POA: Insufficient documentation

## 2013-09-05 DIAGNOSIS — E119 Type 2 diabetes mellitus without complications: Secondary | ICD-10-CM | POA: Insufficient documentation

## 2013-09-05 DIAGNOSIS — I10 Essential (primary) hypertension: Secondary | ICD-10-CM | POA: Diagnosis not present

## 2013-09-05 DIAGNOSIS — Z8739 Personal history of other diseases of the musculoskeletal system and connective tissue: Secondary | ICD-10-CM | POA: Diagnosis not present

## 2013-09-05 DIAGNOSIS — Z79899 Other long term (current) drug therapy: Secondary | ICD-10-CM | POA: Diagnosis not present

## 2013-09-05 DIAGNOSIS — R51 Headache: Secondary | ICD-10-CM | POA: Diagnosis not present

## 2013-09-05 DIAGNOSIS — Z860100 Personal history of colon polyps, unspecified: Secondary | ICD-10-CM | POA: Insufficient documentation

## 2013-09-05 DIAGNOSIS — J40 Bronchitis, not specified as acute or chronic: Secondary | ICD-10-CM

## 2013-09-05 DIAGNOSIS — I509 Heart failure, unspecified: Secondary | ICD-10-CM | POA: Diagnosis not present

## 2013-09-05 DIAGNOSIS — R059 Cough, unspecified: Secondary | ICD-10-CM | POA: Diagnosis not present

## 2013-09-05 DIAGNOSIS — J209 Acute bronchitis, unspecified: Secondary | ICD-10-CM | POA: Diagnosis not present

## 2013-09-05 DIAGNOSIS — H6091 Unspecified otitis externa, right ear: Secondary | ICD-10-CM

## 2013-09-05 DIAGNOSIS — Z8619 Personal history of other infectious and parasitic diseases: Secondary | ICD-10-CM | POA: Insufficient documentation

## 2013-09-05 MED ORDER — AZITHROMYCIN 250 MG PO TABS: 250.0000 mg | ORAL_TABLET | Freq: Every day | ORAL | Status: DC

## 2013-09-05 MED ORDER — CIPROFLOXACIN-DEXAMETHASONE 0.3-0.1 % OT SUSP
4.0000 [drp] | Freq: Once | OTIC | Status: AC
  Administered 2013-09-05: 4 [drp] via OTIC
  Filled 2013-09-05: qty 7.5

## 2013-09-05 MED ORDER — IPRATROPIUM BROMIDE HFA 17 MCG/ACT IN AERS
2.0000 | INHALATION_SPRAY | Freq: Once | RESPIRATORY_TRACT | Status: AC
  Administered 2013-09-05: 2 via RESPIRATORY_TRACT
  Filled 2013-09-05: qty 12.9

## 2013-09-05 MED ORDER — BENZONATATE 100 MG PO CAPS: 100.0000 mg | ORAL_CAPSULE | Freq: Three times a day (TID) | ORAL | Status: DC

## 2013-09-05 NOTE — ED Provider Notes (Signed)
Medical screening examination/treatment/procedure(s) were performed by non-physician practitioner and as supervising physician I was immediately available for consultation/collaboration.  EKG Interpretation   None         Houston Siren, MD 09/05/13 [phone]

## 2013-09-05 NOTE — Discharge Instructions (Signed)
Use ear drops, 4 drops in right ear twice a day. Use inhaler 2 puffs every 4 hrs. Tessalon for cough. zithromax for infection. Rest. Drink plenty of fluids. Follow up with your doctor early next week.   Bronchitis Bronchitis is inflammation of the airways that extend from the windpipe into the lungs (bronchi). The inflammation often causes mucus to develop, which leads to a cough. If the inflammation becomes severe, it may cause shortness of breath. CAUSES  Bronchitis may be caused by:   Viral infections.   Bacteria.   Cigarette smoke.   Allergens, pollutants, and other irritants.  SIGNS AND SYMPTOMS  The most common symptom of bronchitis is a frequent cough that produces mucus. Other symptoms include:  Fever.   Body aches.   Chest congestion.   Chills.   Shortness of breath.   Sore throat.  DIAGNOSIS  Bronchitis is usually diagnosed through a medical history and physical exam. Tests, such as chest X-rays, are sometimes done to rule out other conditions.  TREATMENT  You may need to avoid contact with whatever caused the problem (smoking, for example). Medicines are sometimes needed. These may include:  Antibiotics. These may be prescribed if the condition is caused by bacteria.  Cough suppressants. These may be prescribed for relief of cough symptoms.   Inhaled medicines. These may be prescribed to help open your airways and make it easier for you to breathe.   Steroid medicines. These may be prescribed for those with recurrent (chronic) bronchitis. HOME CARE INSTRUCTIONS  Get plenty of rest.   Drink enough fluids to keep your urine clear or pale yellow (unless you have a medical condition that requires fluid restriction). Increasing fluids may help thin your secretions and will prevent dehydration.   Only take over-the-counter or prescription medicines as directed by your health care provider.  Only take antibiotics as directed. Make sure you finish them  even if you start to feel better.  Avoid secondhand smoke, irritating chemicals, and strong fumes. These will make bronchitis worse. If you are a smoker, quit smoking. Consider using nicotine gum or skin patches to help control withdrawal symptoms. Quitting smoking will help your lungs heal faster.   Put a cool-mist humidifier in your bedroom at night to moisten the air. This may help loosen mucus. Change the water in the humidifier daily. You can also run the hot water in your shower and sit in the bathroom with the door closed for 5 10 minutes.   Follow up with your health care provider as directed.   Wash your hands frequently to avoid catching bronchitis again or spreading an infection to others.  SEEK MEDICAL CARE IF: Your symptoms do not improve after 1 week of treatment.  SEEK IMMEDIATE MEDICAL CARE IF:  Your fever increases.  You have chills.   You have chest pain.   You have worsening shortness of breath.   You have bloody sputum.  You faint.  You have lightheadedness.  You have a severe headache.   You vomit repeatedly. MAKE SURE YOU:   Understand these instructions.  Will watch your condition.  Will get help right away if you are not doing well or get worse. Document Released: 07/30/2005 Document Revised: 05/20/2013 Document Reviewed: 03/24/2013 [facility] Patient Information 2014 [place]. Otitis Externa Otitis externa is a bacterial or fungal infection of the outer ear canal. This is the area from the eardrum to the outside of the ear. Otitis externa is sometimes called "swimmer's ear." CAUSES  Possible  causes of infection include:  Swimming in dirty water.  Moisture remaining in the ear after swimming or bathing.  Mild injury (trauma) to the ear.  Objects stuck in the ear (foreign body).  Cuts or scrapes (abrasions) on the outside of the ear. SYMPTOMS  The first symptom of infection is often itching in the ear canal. Later signs and  symptoms may include swelling and redness of the ear canal, ear pain, and yellowish-white fluid (pus) coming from the ear. The ear pain may be worse when pulling on the earlobe. DIAGNOSIS  Your caregiver will perform a physical exam. A sample of fluid may be taken from the ear and examined for bacteria or fungi. TREATMENT  Antibiotic ear drops are often given for 10 to 14 days. Treatment may also include pain medicine or corticosteroids to reduce itching and swelling. PREVENTION   Keep your ear dry. Use the corner of a towel to absorb water out of the ear canal after swimming or bathing.  Avoid scratching or putting objects inside your ear. This can damage the ear canal or remove the protective wax that lines the canal. This makes it easier for bacteria and fungi to grow.  Avoid swimming in lakes, polluted water, or poorly chlorinated pools.  You may use ear drops made of rubbing alcohol and vinegar after swimming. Combine equal parts of white vinegar and alcohol in a bottle. Put 3 or 4 drops into each ear after swimming. HOME CARE INSTRUCTIONS   Apply antibiotic ear drops to the ear canal as prescribed by your caregiver.  Only take over-the-counter or prescription medicines for pain, discomfort, or fever as directed by your caregiver.  If you have diabetes, follow any additional treatment instructions from your caregiver.  Keep all follow-up appointments as directed by your caregiver. SEEK MEDICAL CARE IF:   You have a fever.  Your ear is still red, swollen, painful, or draining pus after 3 days.  Your redness, swelling, or pain gets worse.  You have a severe headache.  You have redness, swelling, pain, or tenderness in the area behind your ear. MAKE SURE YOU:   Understand these instructions.  Will watch your condition.  Will get help right away if you are not doing well or get worse. Document Released: 07/30/2005 Document Revised: 10/22/2011 Document Reviewed:  08/16/2011 [facility] Patient Information 2014 [place].

## 2013-09-05 NOTE — ED Provider Notes (Signed)
CSN: [id number]     Arrival date & time 09/05/13  0751 History   First MD Initiated Contact with Patient 09/05/13 0801     Chief Complaint  Patient presents with  . Otalgia  . Sore Throat  . Headache  . Cough   (Consider location/radiation/quality/duration/timing/severity/associated sxs/prior Treatment) HPI [name] [name] is a 58 y.o. male with multiple medical problems, who presents to emergency department complaining of flulike symptoms. Patient states he has had supper, nasal congestion, right ear pain, cough for 2 1/2 weeks. Patient states he has tried multiple over-the-counter medications including Mucinex, NyQuil, TheraFlu, and tried home remedies such as recurrent honey lemon. He states he has received no relief of his symptoms. He states his right ears was bothering him the most now as well as the cough. States right ear is swollen, draining purulent drainage. He reports associated shortness of breath. He denies any chest pain. He has not seen his primary care provider for these symptoms yet. He denies any fever. He denies any neck pain or stiffness. He denies any nausea, vomiting, diarrhea.    Past Medical History  Diagnosis Date  . Hypertension   . Gout   . Atrial fibrillation     On coumadin  . DIABETES MELLITUS, TYPE II 03/06/2009  . HYPERTENSION, BENIGN ESSENTIAL 04/07/2009  . LIVER FUNCTION TESTS, ABNORMAL, HX OF 12/30/2006  . History of cardiac arrhythmia 12/18/2011    S/p cardioversion ?    . LV dysfunction 12/18/2011    EF 60-65% by echo 12/2011; 2 D echo (2008)Overall left ventricular systolic function was moderately to markedly decreased.Left ventricular ejection fraction was estimated , range being 25% to 35 %.There was severe diffuse left ventricular hypokinesis.     . Rectal bleeding 12/18/2011    Scheduled for colonoscopy.    [name] HEPATITIS B, CHRONIC 12/30/2006  . OSTEOCHONDROMA 12/30/2006     LEFT SHOULDER   . HYPERCHOLESTEROLEMIA 07/11/2010  . MITRAL REGURGITATION  12/30/2006  . COLONIC POLYPS, HX OF 12/30/2006  . Heart murmur   . CAD (coronary artery disease), native coronary artery     Nonobstructive CAD by cath 2013 - diffuse distal and branch vessel CAD, no severe disease in the major coronaries, LV mild global hypokinesis, EF 45%  . Congestive heart failure, unspecified   . H/O atrial flutter 2007   Past Surgical History  Procedure Laterality Date  . Osteochondroma excision  1972  . Coronary angioplasty  12/05/01  . A flutter ablation  2007, 2008    catheter ablation   . Eye surgery     Family History  Problem Relation Age of Onset  . Diabetes Mother   . Hypertension Mother    History  Substance Use Topics  . Smoking status: Former Smoker -- 0.20 packs/day for 18 years    Quit date: 08/14/1983  . Smokeless tobacco: Never Used  . Alcohol Use: 7.0 oz/week    14 drink(s) per week     Comment: "1 pint of liquor 6 beers in a week"    Review of Systems  Constitutional: Negative for fever and chills.  HENT: Positive for ear discharge, ear pain, rhinorrhea and sore throat.   Respiratory: Positive for cough and shortness of breath. Negative for chest tightness.   Cardiovascular: Negative for chest pain, palpitations and leg swelling.  Gastrointestinal: Negative for nausea, vomiting, abdominal pain, diarrhea and abdominal distention.  Genitourinary: Negative for dysuria, urgency, frequency and hematuria.  Musculoskeletal: Negative for arthralgias, myalgias, neck pain and neck stiffness.  Skin: Negative for rash.  Allergic/Immunologic: Negative for immunocompromised state.  Neurological: Positive for headaches. Negative for dizziness, weakness, light-headedness and numbness.    Allergies  Other  Home Medications   Current Outpatient Rx  Name  Route  Sig  Dispense  Refill  . amLODipine (NORVASC) 10 MG tablet   Oral   Take 10 mg by mouth daily.         [name] atorvastatin (LIPITOR) 40 MG tablet   Oral   Take 80 mg by mouth daily.          . carvedilol (COREG) 25 MG tablet   Oral   Take 25 mg by mouth 2 (two) times daily with a meal.         . colchicine (COLCRYS) 0.6 MG tablet   Oral   Take 0.6 mg by mouth 3 (three) times daily as needed (Gout flare up).          . furosemide (LASIX) 40 MG tablet   Oral   Take 40 mg by mouth.         . hydrALAZINE (APRESOLINE) 50 MG tablet   Oral   Take 1 tablet (50 mg total) by mouth every 8 (eight) hours.   90 tablet   0   . insulin aspart (NOVOLOG) 100 unit/ml SOLN   Subcutaneous   Inject 7 Units into the skin 3 (three) times daily with meals. novolog pen         . insulin glargine (LANTUS) 100 UNIT/ML injection   Subcutaneous   Inject 60 Units into the skin at bedtime.          . isosorbide mononitrate (IMDUR) 60 MG 24 hr tablet   Oral   Take 60 mg by mouth daily.          [name] losartan (COZAAR) 100 MG tablet   Oral   Take 100 mg by mouth daily.         . metFORMIN (GLUCOPHAGE) 1000 MG tablet   Oral   Take 1,000 mg by mouth.         . EXPIRED: nitroGLYCERIN (NITROSTAT) 0.4 MG SL tablet   Sublingual   Place 1 tablet (0.4 mg total) under the tongue every 5 (five) minutes x 3 doses as needed for chest pain (up to 3 doses).   100 tablet   3   . warfarin (COUMADIN) 5 MG tablet   Oral   Take 2.5-5 mg by mouth daily. Take one tablet (5mg ) everyday except Wednesday take 0.5 tablet (2.5 mg).          BP 158/87  Pulse 69  Temp(Src) 98.4 F (36.9 C)  Resp 18  SpO2 98% Physical Exam  Nursing note and vitals reviewed. Constitutional: He is oriented to person, place, and time. He appears well-developed and well-nourished. No distress.  HENT:  Head: Normocephalic and atraumatic.  Left Ear: External ear normal.  Nose: Nose normal.  Mouth/Throat: Oropharynx is clear and moist.  Swelling of the right ear canal, purulent drainage in the ear canal noted. No mastoid tenderness. Right submandibular lymphadenopathy, tender.  Eyes: Conjunctivae are  normal.  Neck: Neck supple.  Cardiovascular: Normal rate, regular rhythm and normal heart sounds.   Pulmonary/Chest: Effort normal. No respiratory distress. He has no wheezes. He has no rales.  Musculoskeletal: He exhibits no edema.  Neurological: He is alert and oriented to person, place, and time.  Skin: Skin is warm and dry.    ED Course  Procedures (including  critical care time) Labs Review Labs Reviewed - No data to display Imaging Review Dg Chest 2 View  09/05/2013   CLINICAL DATA:  Cough and congestion for the past 2 weeks.  EXAM: CHEST  2 VIEW  COMPARISON:  Chest x-ray 07/31/2013.  FINDINGS: Lung volumes are normal. No consolidative airspace disease. No pleural effusions. No pneumothorax. No pulmonary nodule or mass noted. Pulmonary vasculature and the cardiomediastinal silhouette are within normal limits.  IMPRESSION: 1.  No radiographic evidence of acute cardiopulmonary disease.   Electronically Signed   By: Vinnie Langton M.D.   On: 09/05/2013 09:22    EKG Interpretation   None       MDM   1. Bronchitis   2. Otitis externa of right ear     Pt with multiple medical problems. Here with flu like symptoms and right ear drainage. Exam shows right otitis externa. Symptoms consistent with upper respiratory infection/bronchitis. Due to symptoms lasting for greater than 2 weeks with start on oral antibiotic. Will also provide with cipro ear drops. Albuterol inhaler for cough. CXR negative.  Follow up with PCP.   Filed Vitals:   09/05/13 0853 09/05/13 1012  BP: 158/87 166/100  Pulse: 69 77  Temp: 98.4 F (36.9 C)   Resp: 18 18  SpO2: 98% 98%     Kamarius Buckbee A Quintell Bonnin, PA-C 09/05/13 1621

## 2013-09-05 NOTE — ED Notes (Signed)
Pt c/o rt ear pain, sore throat, cough, headache x 2.5 wks.

## 2013-09-10 ENCOUNTER — Ambulatory Visit (INDEPENDENT_AMBULATORY_CARE_PROVIDER_SITE_OTHER): Payer: Medicare Other

## 2013-09-10 DIAGNOSIS — Z5181 Encounter for therapeutic drug level monitoring: Secondary | ICD-10-CM | POA: Diagnosis not present

## 2013-09-10 DIAGNOSIS — I4891 Unspecified atrial fibrillation: Secondary | ICD-10-CM

## 2013-09-10 DIAGNOSIS — Z7901 Long term (current) use of anticoagulants: Secondary | ICD-10-CM | POA: Diagnosis not present

## 2013-09-10 LAB — POCT INR: INR: 2.8

## 2013-10-05 ENCOUNTER — Encounter (HOSPITAL_COMMUNITY): Payer: Self-pay | Admitting: Emergency Medicine

## 2013-10-05 ENCOUNTER — Ambulatory Visit: Payer: Medicare Other | Attending: Internal Medicine | Admitting: Internal Medicine

## 2013-10-05 ENCOUNTER — Emergency Department (HOSPITAL_COMMUNITY)
Admission: EM | Admit: 2013-10-05 | Discharge: 2013-10-05 | Disposition: A | Discharge status: Home / Self Care | Payer: Medicare Other | Attending: Emergency Medicine | Admitting: Emergency Medicine

## 2013-10-05 ENCOUNTER — Emergency Department (HOSPITAL_COMMUNITY): Payer: Medicare Other

## 2013-10-05 ENCOUNTER — Encounter: Payer: Self-pay | Admitting: Internal Medicine

## 2013-10-05 VITALS — BP 108/79 | HR 129 | Temp 98.9°F | Resp 14 | Ht 72.0 in | Wt 213.4 lb

## 2013-10-05 DIAGNOSIS — E7800 Pure hypercholesterolemia, unspecified: Secondary | ICD-10-CM | POA: Diagnosis not present

## 2013-10-05 DIAGNOSIS — I10 Essential (primary) hypertension: Secondary | ICD-10-CM | POA: Insufficient documentation

## 2013-10-05 DIAGNOSIS — I2510 Atherosclerotic heart disease of native coronary artery without angina pectoris: Secondary | ICD-10-CM | POA: Insufficient documentation

## 2013-10-05 DIAGNOSIS — Z8619 Personal history of other infectious and parasitic diseases: Secondary | ICD-10-CM | POA: Insufficient documentation

## 2013-10-05 DIAGNOSIS — Z09 Encounter for follow-up examination after completed treatment for conditions other than malignant neoplasm: Secondary | ICD-10-CM | POA: Insufficient documentation

## 2013-10-05 DIAGNOSIS — Z87891 Personal history of nicotine dependence: Secondary | ICD-10-CM | POA: Insufficient documentation

## 2013-10-05 DIAGNOSIS — I509 Heart failure, unspecified: Secondary | ICD-10-CM | POA: Insufficient documentation

## 2013-10-05 DIAGNOSIS — Z7901 Long term (current) use of anticoagulants: Secondary | ICD-10-CM | POA: Insufficient documentation

## 2013-10-05 DIAGNOSIS — Z794 Long term (current) use of insulin: Secondary | ICD-10-CM | POA: Insufficient documentation

## 2013-10-05 DIAGNOSIS — E119 Type 2 diabetes mellitus without complications: Secondary | ICD-10-CM | POA: Insufficient documentation

## 2013-10-05 DIAGNOSIS — R011 Cardiac murmur, unspecified: Secondary | ICD-10-CM | POA: Diagnosis not present

## 2013-10-05 DIAGNOSIS — R079 Chest pain, unspecified: Secondary | ICD-10-CM

## 2013-10-05 DIAGNOSIS — E785 Hyperlipidemia, unspecified: Secondary | ICD-10-CM | POA: Insufficient documentation

## 2013-10-05 DIAGNOSIS — Z9861 Coronary angioplasty status: Secondary | ICD-10-CM | POA: Insufficient documentation

## 2013-10-05 DIAGNOSIS — R1115 Cyclical vomiting syndrome unrelated to migraine: Secondary | ICD-10-CM | POA: Diagnosis not present

## 2013-10-05 DIAGNOSIS — I4891 Unspecified atrial fibrillation: Secondary | ICD-10-CM | POA: Diagnosis not present

## 2013-10-05 DIAGNOSIS — R0602 Shortness of breath: Secondary | ICD-10-CM | POA: Diagnosis not present

## 2013-10-05 DIAGNOSIS — Z860100 Personal history of colon polyps, unspecified: Secondary | ICD-10-CM | POA: Insufficient documentation

## 2013-10-05 DIAGNOSIS — Z79899 Other long term (current) drug therapy: Secondary | ICD-10-CM | POA: Insufficient documentation

## 2013-10-05 DIAGNOSIS — I5022 Chronic systolic (congestive) heart failure: Secondary | ICD-10-CM | POA: Insufficient documentation

## 2013-10-05 DIAGNOSIS — R072 Precordial pain: Secondary | ICD-10-CM | POA: Diagnosis not present

## 2013-10-05 DIAGNOSIS — Z8739 Personal history of other diseases of the musculoskeletal system and connective tissue: Secondary | ICD-10-CM | POA: Insufficient documentation

## 2013-10-05 DIAGNOSIS — R0789 Other chest pain: Secondary | ICD-10-CM | POA: Diagnosis not present

## 2013-10-05 LAB — TROPONIN I: Troponin I: <0.3 ng/mL (ref <0.30)

## 2013-10-05 LAB — CBC
HCT: 47.5 % (ref 39.0–52.0)
Hemoglobin: 16.6 g/dL (ref 13.0–17.0)
MCH: 28.6 pg (ref 26.0–34.0)
MCHC: 34.9 g/dL (ref 30.0–36.0)
MCV: 81.8 fL (ref 78.0–100.0)
PLATELETS: 178 10*3/uL (ref 150–400)
RBC: 5.81 MIL/uL (ref 4.22–5.81)
RDW: 14.4 % (ref 11.5–15.5)
WBC: 7.4 10*3/uL (ref 4.0–10.5)

## 2013-10-05 LAB — COMPREHENSIVE METABOLIC PANEL
ALT: 13 U/L (ref 0–53)
AST: 15 U/L (ref 0–37)
Albumin: 3.4 g/dL — ABNORMAL LOW (ref 3.5–5.2)
Alkaline Phosphatase: 60 U/L (ref 39–117)
BUN: 19 mg/dL (ref 6–23)
CO2: 20 meq/L (ref 19–32)
CREATININE: 1.55 mg/dL — AB (ref 0.50–1.35)
Calcium: 9.2 mg/dL (ref 8.4–10.5)
Chloride: 97 mEq/L (ref 96–112)
GFR calc Af Amer: 55 mL/min — ABNORMAL LOW (ref >90)
GFR, EST NON AFRICAN AMERICAN: 47 mL/min — AB (ref >90)
Glucose, Bld: 487 mg/dL — ABNORMAL HIGH (ref 70–99)
Potassium: 4.5 mEq/L (ref 3.7–5.3)
Sodium: 134 mEq/L — ABNORMAL LOW (ref 137–147)
Total Bilirubin: 0.8 mg/dL (ref 0.3–1.2)
Total Protein: 7.5 g/dL (ref 6.0–8.3)

## 2013-10-05 LAB — PROTIME-INR
INR: 1.43 (ref 0.00–1.49)
Prothrombin Time: 17.1 seconds — ABNORMAL HIGH (ref 11.6–15.2)

## 2013-10-05 LAB — PRO B NATRIURETIC PEPTIDE: Pro B Natriuretic peptide (BNP): 932.3 pg/mL — ABNORMAL HIGH (ref 0–125)

## 2013-10-05 MED ORDER — DILTIAZEM HCL 60 MG PO TABS
120.0000 mg | ORAL_TABLET | Freq: Once | ORAL | Status: DC
  Filled 2013-10-05: qty 2

## 2013-10-05 MED ORDER — DILTIAZEM HCL ER COATED BEADS 120 MG PO CP24: 120.0000 mg | ORAL_CAPSULE | Freq: Once | ORAL | Status: DC

## 2013-10-05 MED ORDER — DILTIAZEM HCL 100 MG IV SOLR
5.0000 mg/h | INTRAVENOUS | Status: DC
  Administered 2013-10-05: 5 mg/h via INTRAVENOUS

## 2013-10-05 MED ORDER — DILTIAZEM HCL ER COATED BEADS 120 MG PO CP24
120.0000 mg | ORAL_CAPSULE | ORAL | Status: AC
  Administered 2013-10-05: 120 mg via ORAL
  Filled 2013-10-05: qty 1

## 2013-10-05 MED ORDER — DILTIAZEM LOAD VIA INFUSION
10.0000 mg | Freq: Once | INTRAVENOUS | Status: AC
  Administered 2013-10-05: 10 mg via INTRAVENOUS

## 2013-10-05 MED ORDER — DILTIAZEM HCL 25 MG/5ML IV SOLN
10.0000 mg | Freq: Once | INTRAVENOUS | Status: DC
Start: 2013-10-05 — End: 2013-10-05
  Filled 2013-10-05: qty 5

## 2013-10-05 MED ORDER — DILTIAZEM HCL 100 MG IV SOLR: 5.0000 mg/h | INTRAVENOUS | Status: DC

## 2013-10-05 NOTE — Consult Note (Signed)
FMTS Attending Note  The plan of care was discussed with the resident team. I agree with the assessment and plan as documented by the resident.   Velva Molinari MD 

## 2013-10-05 NOTE — Consult Note (Signed)
Family Medicine Teaching Service Consult Note Service Pager: [phone]  Patient name: [name] [name] Medical record number: [id number] Date of birth: 22-Aug-1955 Age: 58 y.o. Gender: male  Primary Care Provider: Angelica Chessman, MD Consultants: Cardiology Code Status: Full  Chief Complaint: Generalized weakness, Tachycardia  Assessment and Plan: [name] [name] is a 58 y.o. male presenting with generalized weakness and chest discomfort, found to be in atrial fibrillation with RVR. PMH is significant for PAF (s/p cardioversion x 2), HTN, DM2, CAD, HLD, Mitral Regurgitation, Systolic CHF, Hepatitis B.  # Atrial Fibrillation with rapid ventricular rate - Resolved Arrived to ED via EMS from [facility] for AFib RVR up to HR 150s. Likely etiology for patient's recent symptoms such as generalized weakness, SOB, and chest discomfort. In ED, AFib confirmed on EKG, received Diltiazem IV 10mg  bolus then placed on Dilt IV drip, with significant response. - paged by ED for evaluation of possible admission - currently hemodynamically stable, afebrile, BP 130s/90s, resolved RVR, current HR stable 80-90s (s/p Diltiazem IV) - resolution of symptoms (no longer has active CP, improved weakness and SOB) - contacted primary Cardiologist (Dr. Loralie Champagne), discussed case and chart reviewed by Cardiology, determined that patient currently stable on Diltiazem, recommended trial of Diltiazem PO in ED and close monitoring, if remained stable then advised for discharge home with close follow-up - start Diltiazem 120mg  PO, will rx on discharge - scheduled cardiology f/u with Dr. Aundra Dubin for 10/12/13 at 2:00pm - advised close f/u with PCP at [facility]  Disposition: Plan to discharge from ED with close Cardiology follow-up  History of Present Illness:  [name] [name] is a 58 y.o. male presenting with Atrial Fibrillation with rapid heart rate. He was seen earlier by  [place] today and due to reported chest pain and SOB with AFib RvR transported by EMS to [facility] ED for further evaluation.  In the ED, patient found to be in Atrial Fibrillation still with highest HR up to 154, confirmed on EKG. Received Diltiazem IV 10mg  bolus and then placed on Diltiazem IV drip. Significant symptom relief following decreased HR, returned to normal rate 80-90 HR.  On our evaluation, patient states that his chest pain has currently resolved, but earlier reported a feeling on Left side of chest discomfort described as "burning and heartburn", mostly constant lasting >1 hr but has had some periods of relief, associated with shortness of breath for the past 3 days, SOB occurs at rest and unclear if worsened by exertion (recently decreased activity), also endorses generalized weakness for several days to weeks well. Additionally, this morning woke up with dizziness, worse with standing, since improved in ED. Reports previously feeling some palpitations with rapid heart rate yesterday, since improved in ED.  Followed by Dr. Loralie Champagne (Cardiology) on outpatient basis for CAD and paroxysmal AFib (s/p 2 prior cardioversion attempts), currently on chronic anticoagulation with Coumadin. Per chart review, last Cardiac Cath (12/2011 with global hypokinesis and severe distal / branch vessel disease).  Review Of Systems: Per HPI with the following additions:  Admits recent cold for past 2 weeks (recently took part of a Z-pak), occasional productive cough, palpitations. Denies fevers/chills, HA, abdominal pain, nausea / vomiting, muscle aches.  Otherwise 12 point review of systems was performed and was unremarkable.  Patient Active Problem List   Diagnosis Date Noted  . Encounter for therapeutic drug monitoring 09/10/2013  . HTN (hypertension) 07/31/2013  . Intermediate coronary syndrome 12/15/2012  . Encounter for  long-term (current) use of anticoagulants  04/16/2012  . CHF (congestive heart failure) 02/11/2012  . Coronary artery disease 12/28/2011  . Atrial fibrillation 12/28/2011  . Chest pain 12/18/2011  . History of cardiac arrhythmia 12/18/2011  . Chronic systolic CHF (congestive heart failure) 12/18/2011  . Rectal bleeding 12/18/2011  . CANDIDIASIS 09/27/2010  . HYPERCHOLESTEROLEMIA 07/11/2010  . DENTAL CARIES 06/30/2010  . GOUT 05/05/2009  . HYPERTENSION, BENIGN ESSENTIAL 04/07/2009  . DIABETES MELLITUS, TYPE II 03/06/2009  . HEPATITIS B, CHRONIC 12/30/2006  . OSTEOCHONDROMA 12/30/2006  . MITRAL REGURGITATION 12/30/2006  . LIVER FUNCTION TESTS, ABNORMAL, HX OF 12/30/2006  . COLONIC POLYPS, HX OF 12/30/2006  . COLONOSCOPY, HX OF 05/12/2001   Past Medical History: Past Medical History  Diagnosis Date  . Hypertension   . Gout   . Atrial fibrillation     On coumadin  . DIABETES MELLITUS, TYPE II 03/06/2009  . HYPERTENSION, BENIGN ESSENTIAL 04/07/2009  . LIVER FUNCTION TESTS, ABNORMAL, HX OF 12/30/2006  . History of cardiac arrhythmia 12/18/2011    S/p cardioversion ?    . LV dysfunction 12/18/2011    EF 60-65% by echo 12/2011; 2 D echo (2008)Overall left ventricular systolic function was moderately to markedly decreased.Left ventricular ejection fraction was estimated , range being 25% to 35 %.There was severe diffuse left ventricular hypokinesis.     . Rectal bleeding 12/18/2011    Scheduled for colonoscopy.    [name] HEPATITIS B, CHRONIC 12/30/2006  . OSTEOCHONDROMA 12/30/2006     LEFT SHOULDER   . HYPERCHOLESTEROLEMIA 07/11/2010  . MITRAL REGURGITATION 12/30/2006  . COLONIC POLYPS, HX OF 12/30/2006  . Heart murmur   . CAD (coronary artery disease), native coronary artery     Nonobstructive CAD by cath 2013 - diffuse distal and branch vessel CAD, no severe disease in the major coronaries, LV mild global hypokinesis, EF 45%  . Congestive heart failure, unspecified   . H/O atrial flutter 2007   Past Surgical History: Past Surgical  History  Procedure Laterality Date  . Osteochondroma excision  1972  . Coronary angioplasty  12/05/01  . A flutter ablation  2007, 2008    catheter ablation   . Eye surgery     Social History: History  Substance Use Topics  . Smoking status: Former Smoker -- 0.20 packs/day for 18 years    Quit date: 08/14/1983  . Smokeless tobacco: Never Used  . Alcohol Use: 7.0 oz/week    14 drink(s) per week     Comment: "1 pint of liquor 6 beers in a week"   Additional social history: occasional marijuana  Please also refer to relevant sections of EMR.  Family History: Family History  Problem Relation Age of Onset  . Diabetes Mother   . Hypertension Mother    Allergies and Medications: Allergies  Allergen Reactions  . Other Hives    Patient reports developing hives after receiving "some antibiotic years ago [facility] He does not know which antibiotic.   No current facility-administered medications on file prior to encounter.   Current Outpatient Prescriptions on File Prior to Encounter  Medication Sig Dispense Refill  . carvedilol (COREG) 25 MG tablet Take 25 mg by mouth 2 (two) times daily with a meal.      . furosemide (LASIX) 40 MG tablet Take 40 mg by mouth daily.       . hydrALAZINE (APRESOLINE) 50 MG tablet Take 1 tablet (50 mg total) by mouth every 8 (eight) hours.  90 tablet  0  . ibuprofen (ADVIL,MOTRIN) 200 MG tablet Take 600 mg by mouth every 6 (six) hours as needed for mild pain or moderate pain.      [name] insulin detemir (LEVEMIR) 100 UNIT/ML injection Inject 30 Units into the skin 2 (two) times daily.      . isosorbide mononitrate (IMDUR) 60 MG 24 hr tablet Take 60 mg by mouth daily.       [name] losartan (COZAAR) 100 MG tablet Take 100 mg by mouth daily.      . metFORMIN (GLUCOPHAGE) 1000 MG tablet Take 1,000 mg by mouth 2 (two) times daily with a meal.       . warfarin (COUMADIN) 5 MG tablet Take 2.5-5 mg by mouth daily. Take one tablet (5mg ) everyday except  Wednesday take 0.5 tablet (2.5 mg).      [name] atorvastatin (LIPITOR) 40 MG tablet Take 40 mg by mouth daily.         Objective: BP 133/95  Pulse 78  Temp(Src) 98.1 F (36.7 C) (Oral)  Resp 25  Ht 6' (1.829 m)  Wt 213 lb (96.616 kg)  BMI 28.88 kg/m2  SpO2 91% Exam: General: well-appearing, sitting up in bed, pleasant, cooperative, NAD HEENT: NCAT, PERRL, EOMI, oropharynx clear, MMM Cardiovascular: irregularly irregular, normal HR (rate controlled), no murmurs Respiratory: CTAB, no wheezing, crackles, or rhonchi. Normal WOB, no tachypnea Abdomen: obese, soft, NTND, +active BS Extremities: no edema, non-tender, moves all ext, WWP, +2 peripheral pulses intact radial / dp Skin: warm, dry, intact Neuro: awake, alert, oriented, intact CN-II-XII, grossly non-focal, intact muscle strength in all ext 5/5, distal sensation to light touch intact  Labs and Imaging: CBC BMET   Recent Labs Lab 10/05/13 1240  WBC 7.4  HGB 16.6  HCT 47.5  PLT 178    Recent Labs Lab 10/05/13 1240  NA 134*  K 4.5  CL 97  CO2 20  BUN 19  CREATININE 1.55*  GLUCOSE 487*  CALCIUM 9.2     Troponin-I - negative ProBNP - 932.3 INR 1.43 / INR 17.1  2/23 CXR Portable 1v IMPRESSION:  No evidence of acute cardiopulmonary disease within the visualized  portions of the chest. Stable cardiomegaly.  2/23 EKG Atrial fibrilllation with RVR.  Nobie Putnam, DO 10/05/2013, 4:39 PM PGY-1, [place] Intern pager: [phone], text pages welcome  I have seen and evaluated the above patient. I agree with the note.  [place] PGY-2

## 2013-10-05 NOTE — Progress Notes (Signed)
Patient is here for a hospital follow up. Complains of discomfort spread across entire chest, no pain x3 days. Other symptoms of dizziness, weaknessatigue and trouble sleeping x2 months.

## 2013-10-05 NOTE — ED Notes (Signed)
Paged [facility] to [phone] 

## 2013-10-05 NOTE — ED Notes (Signed)
Spoke with Dr. Lacinda Axon who st's that he called in pt's Rx to his drug store and that pt could be discharged with instructions to follow up with cardiologist this week.  Pt and family updated on plan of care.

## 2013-10-05 NOTE — ED Notes (Signed)
Consulting MD at bedside

## 2013-10-05 NOTE — Progress Notes (Signed)
Performed an EKG on patient. EKG showed atrial fibrillation with rapid ventricular response. Patient complained of chest pain and SOB. Contacted EMS. Spoke with Operator Sam. Patient will be transported to ER by EMS. Patient is in room 7.

## 2013-10-05 NOTE — ED Notes (Signed)
Per EMS [facility]/o chest discomfort and some shortness of breath. Pt placed on monitor found to be in atrial fibrillation rates 110-158bpm. Pt hypotensive last BP 100/70. Alert and oriented.

## 2013-10-05 NOTE — ED Provider Notes (Signed)
CSN: [id number]     Arrival date & time 10/05/13  1224 History   First MD Initiated Contact with Patient 10/05/13 1234     Chief Complaint  Patient presents with  . Chest Pain     (Consider location/radiation/quality/duration/timing/severity/associated sxs/prior Treatment) Patient is a 58 y.o. male presenting with chest pain. The history is provided by the patient (pt complains of chest pain.  sent over by md today).  Chest Pain Pain location:  Substernal area Pain quality: aching   Pain radiates to:  Does not radiate Pain radiates to the back: no   Pain severity:  Moderate Onset quality:  Gradual Timing:  Constant Associated symptoms: palpitations   Associated symptoms: no abdominal pain, no back pain, no cough, no fatigue and no headache     Past Medical History  Diagnosis Date  . Hypertension   . Gout   . Atrial fibrillation     On coumadin  . DIABETES MELLITUS, TYPE II 03/06/2009  . HYPERTENSION, BENIGN ESSENTIAL 04/07/2009  . LIVER FUNCTION TESTS, ABNORMAL, HX OF 12/30/2006  . History of cardiac arrhythmia 12/18/2011    S/p cardioversion ?    . LV dysfunction 12/18/2011    EF 60-65% by echo 12/2011; 2 D echo (2008)Overall left ventricular systolic function was moderately to markedly decreased.Left ventricular ejection fraction was estimated , range being 25% to 35 %.There was severe diffuse left ventricular hypokinesis.     . Rectal bleeding 12/18/2011    Scheduled for colonoscopy.    [name] HEPATITIS B, CHRONIC 12/30/2006  . OSTEOCHONDROMA 12/30/2006     LEFT SHOULDER   . HYPERCHOLESTEROLEMIA 07/11/2010  . MITRAL REGURGITATION 12/30/2006  . COLONIC POLYPS, HX OF 12/30/2006  . Heart murmur   . CAD (coronary artery disease), native coronary artery     Nonobstructive CAD by cath 2013 - diffuse distal and branch vessel CAD, no severe disease in the major coronaries, LV mild global hypokinesis, EF 45%  . Congestive heart failure, unspecified   . H/O atrial flutter 2007   Past Surgical  History  Procedure Laterality Date  . Osteochondroma excision  1972  . Coronary angioplasty  12/05/01  . A flutter ablation  2007, 2008    catheter ablation   . Eye surgery     Family History  Problem Relation Age of Onset  . Diabetes Mother   . Hypertension Mother    History  Substance Use Topics  . Smoking status: Former Smoker -- 0.20 packs/day for 18 years    Quit date: 08/14/1983  . Smokeless tobacco: Never Used  . Alcohol Use: 7.0 oz/week    14 drink(s) per week     Comment: "1 pint of liquor 6 beers in a week"    Review of Systems  Constitutional: Negative for appetite change and fatigue.  HENT: Negative for congestion, ear discharge and sinus pressure.   Eyes: Negative for discharge.  Respiratory: Negative for cough.   Cardiovascular: Positive for chest pain and palpitations.  Gastrointestinal: Negative for abdominal pain and diarrhea.  Genitourinary: Negative for frequency and hematuria.  Musculoskeletal: Negative for back pain.  Skin: Negative for rash.  Neurological: Negative for seizures and headaches.  Psychiatric/Behavioral: Negative for hallucinations.      Allergies  Other  Home Medications   Current Outpatient Rx  Name  Route  Sig  Dispense  Refill  . carvedilol (COREG) 25 MG tablet   Oral   Take 25 mg by mouth 2 (two) times daily with a meal.         .  furosemide (LASIX) 40 MG tablet   Oral   Take 40 mg by mouth daily.          . hydrALAZINE (APRESOLINE) 50 MG tablet   Oral   Take 1 tablet (50 mg total) by mouth every 8 (eight) hours.   90 tablet   0   . ibuprofen (ADVIL,MOTRIN) 200 MG tablet   Oral   Take 600 mg by mouth every 6 (six) hours as needed for mild pain or moderate pain.         [name] insulin aspart (NOVOLOG) 100 UNIT/ML FlexPen   Subcutaneous   Inject 7 Units into the skin 3 (three) times daily with meals.         . insulin detemir (LEVEMIR) 100 UNIT/ML injection   Subcutaneous   Inject 30 Units into the skin 2  (two) times daily.         . isosorbide mononitrate (IMDUR) 60 MG 24 hr tablet   Oral   Take 60 mg by mouth daily.          [name] losartan (COZAAR) 100 MG tablet   Oral   Take 100 mg by mouth daily.         . metFORMIN (GLUCOPHAGE) 1000 MG tablet   Oral   Take 1,000 mg by mouth 2 (two) times daily with a meal.          . warfarin (COUMADIN) 5 MG tablet   Oral   Take 2.5-5 mg by mouth daily. Take one tablet (5mg ) everyday except Wednesday take 0.5 tablet (2.5 mg).         [name] atorvastatin (LIPITOR) 40 MG tablet   Oral   Take 40 mg by mouth daily.           BP 129/65  Pulse 89  Temp(Src) 98.1 F (36.7 C) (Oral)  Resp 20  Ht 6' (1.829 m)  Wt 213 lb (96.616 kg)  BMI 28.88 kg/m2  SpO2 94% Physical Exam  Constitutional: He is oriented to person, place, and time. He appears well-developed.  HENT:  Head: Normocephalic.  Eyes: Conjunctivae and EOM are normal. No scleral icterus.  Neck: Neck supple. No thyromegaly present.  Cardiovascular: Exam reveals no gallop and no friction rub.   No murmur heard. Rapid irregular rate  Pulmonary/Chest: No stridor. He has no wheezes. He has no rales. He exhibits no tenderness.  Abdominal: He exhibits no distension. There is no tenderness. There is no rebound.  Musculoskeletal: Normal range of motion. He exhibits no edema.  Lymphadenopathy:    He has no cervical adenopathy.  Neurological: He is oriented to person, place, and time. He exhibits normal muscle tone. Coordination normal.  Skin: No rash noted. No erythema.  Psychiatric: He has a normal mood and affect. His behavior is normal.    ED Course  Procedures (including critical care time) Labs Review Labs Reviewed  PRO B NATRIURETIC PEPTIDE - Abnormal; Notable for the following:    Pro B Natriuretic peptide (BNP) 932.3 (*)    All other components within normal limits  PROTIME-INR - Abnormal; Notable for the following:    Prothrombin Time 17.1 (*)    All other components  within normal limits  COMPREHENSIVE METABOLIC PANEL - Abnormal; Notable for the following:    Sodium 134 (*)    Glucose, Bld 487 (*)    Creatinine, Ser 1.55 (*)    Albumin 3.4 (*)    GFR calc non Af Amer 47 (*)  GFR calc Af Amer 55 (*)    All other components within normal limits  CBC  TROPONIN I   Imaging Review Dg Chest Port 1 View  10/05/2013   CLINICAL DATA:  CHEST PAIN  EXAM: PORTABLE CHEST - 1 VIEW  COMPARISON:  DG CHEST 2 VIEW dated 09/05/2013  FINDINGS: The cardiac silhouette is enlarged. The inferior lateral portions of the right and left hemithoraces are not included on the study. The visualized portions of the chest demonstrate no evidence of focal infiltrates, effusions, nor edema. The osseous structures are unremarkable.  IMPRESSION: No evidence of acute cardiopulmonary disease within the visualized portions of the chest. Stable cardiomegaly.   Electronically Signed   By: Margaree Mackintosh M.D.   On: 10/05/2013 13:11    EKG Interpretation    Date/Time:  Monday October 05 2013 12:32:04 EST Ventricular Rate:  148 PR Interval:    QRS Duration: 95 QT Interval:  352 QTC Calculation: 552 R Axis:   51 Text Interpretation:  Junctional tachycardia Probable left ventricular hypertrophy Anterior ST elevation, probably due to LVH Prolonged QT interval Confirmed by Samaj Wessells  MD, Tenleigh Byer ([id number]) on 10/05/2013 3:01:36 PM          CRITICAL CARE Performed by: Timoteo Carreiro L Total critical care time: 40 Critical care time was exclusive of separately billable procedures and treating other patients. Critical care was necessary to treat or prevent imminent or life-threatening deterioration. Critical care was time spent personally by me on the following activities: development of treatment plan with patient and/or surrogate as well as nursing, discussions with consultants, evaluation of patient's response to treatment, examination of patient, obtaining history from patient or surrogate,  ordering and performing treatments and interventions, ordering and review of laboratory studies, ordering and review of radiographic studies, pulse oximetry and re-evaluation of patient's condition.   MDM   Final diagnoses:  Atrial fibrillation    Rapid afib.      Maudry Diego, MD 10/05/13 [phone]

## 2013-10-05 NOTE — ED Notes (Signed)
cardizem stopped per dr zammit request

## 2013-10-05 NOTE — Discharge Instructions (Signed)
Dr. Aundra Dubin was consulted and he agreed with discharge home.  You will now be taking an additional medication: Cardizem.  It is 120 mg daily.  Please follow up with Cardiology as indicated: 3/2 @ 2 PM If you develop worsening symptoms or chest pain please return to the ED.

## 2013-10-06 ENCOUNTER — Other Ambulatory Visit: Payer: Self-pay | Admitting: Cardiology

## 2013-10-06 DIAGNOSIS — I4819 Other persistent atrial fibrillation: Secondary | ICD-10-CM | POA: Insufficient documentation

## 2013-10-06 NOTE — Progress Notes (Signed)
Patient ID: [name] [name], male   DOB: 21-Sep-1955, 58 y.o.   MRN: [id number]   [name] [name], is a 58 y.o. male  [id number]:[id number]  [id number]  DOB - September 08, 1955  Chief Complaint  Patient presents with  . Follow-up    Hospital f/u        Subjective:   [name] [name] is a 58 y.o. male here today for a follow up visit. Patient has history of hypertension, diabetes, hyperlipidemia, CAD, paroxysmal afib on Coumadin and following up with cardiology. Patient today complaining of worsening shortness of breath and chest pain. He has had trouble sleeping for the past 2 months, and on and off chest pain. He has been to the ER repeatedly in the past and has had multiple admissions. His other medical history is as listed below with multiple comorbidities. Patient has No headache, No chest pain, No abdominal pain - No Nausea, No new weakness tingling or numbness, No Cough - SOB.  No problems updated.  ALLERGIES: Allergies  Allergen Reactions  . Other Hives    Patient reports developing hives after receiving "some antibiotic years ago at [facility]". He does not know which antibiotic.    PAST MEDICAL HISTORY: Past Medical History  Diagnosis Date  . Hypertension   . Gout   . Atrial fibrillation     On coumadin  . DIABETES MELLITUS, TYPE II 03/06/2009  . HYPERTENSION, BENIGN ESSENTIAL 04/07/2009  . LIVER FUNCTION TESTS, ABNORMAL, HX OF 12/30/2006  . History of cardiac arrhythmia 12/18/2011    S/p cardioversion ?    . LV dysfunction 12/18/2011    EF 60-65% by echo 12/2011; 2 D echo (2008)Overall left ventricular systolic function was moderately to markedly decreased.Left ventricular ejection fraction was estimated , range being 25% to 35 %.There was severe diffuse left ventricular hypokinesis.     . Rectal bleeding 12/18/2011    Scheduled for colonoscopy.    [name] HEPATITIS B, CHRONIC 12/30/2006  . OSTEOCHONDROMA 12/30/2006     LEFT SHOULDER   . HYPERCHOLESTEROLEMIA 07/11/2010  . MITRAL  REGURGITATION 12/30/2006  . COLONIC POLYPS, HX OF 12/30/2006  . Heart murmur   . CAD (coronary artery disease), native coronary artery     Nonobstructive CAD by cath 2013 - diffuse distal and branch vessel CAD, no severe disease in the major coronaries, LV mild global hypokinesis, EF 45%  . Congestive heart failure, unspecified   . H/O atrial flutter 2007    MEDICATIONS AT HOME: Prior to Admission medications   Medication Sig Start Date End Date Taking? Authorizing Provider  atorvastatin (LIPITOR) 40 MG tablet Take 40 mg by mouth daily.  05/05/13  Yes Larey Dresser, MD  carvedilol (COREG) 25 MG tablet Take 25 mg by mouth 2 (two) times daily with a meal.   Yes Historical Provider, MD  furosemide (LASIX) 40 MG tablet Take 40 mg by mouth daily.    Yes Historical Provider, MD  hydrALAZINE (APRESOLINE) 50 MG tablet Take 1 tablet (50 mg total) by mouth every 8 (eight) hours. 08/01/13  Yes Maryann Mikhail, DO  ibuprofen (ADVIL,MOTRIN) 200 MG tablet Take 600 mg by mouth every 6 (six) hours as needed for mild pain or moderate pain.   Yes Historical Provider, MD  insulin detemir (LEVEMIR) 100 UNIT/ML injection Inject 30 Units into the skin 2 (two) times daily.   Yes Historical Provider, MD  isosorbide mononitrate (IMDUR) 60 MG 24 hr tablet Take 60 mg by mouth daily.  05/05/13  Yes Larey Dresser,  MD  losartan (COZAAR) 100 MG tablet Take 100 mg by mouth daily.   Yes Historical Provider, MD  metFORMIN (GLUCOPHAGE) 1000 MG tablet Take 1,000 mg by mouth 2 (two) times daily with a meal.    Yes Historical Provider, MD  warfarin (COUMADIN) 5 MG tablet Take 2.5-5 mg by mouth daily. Take one tablet (5mg ) everyday except Wednesday take 0.5 tablet (2.5 mg).   Yes Historical Provider, MD  diltiazem (CARDIZEM CD) 120 MG 24 hr capsule Take 1 capsule (120 mg total) by mouth once. 10/05/13   Coral Spikes, DO  insulin aspart (NOVOLOG) 100 UNIT/ML FlexPen Inject 7 Units into the skin 3 (three) times daily with meals.     Historical Provider, MD     Objective:   Filed Vitals:   10/05/13 1118  BP: 108/79  Pulse: 129  Temp: 98.9 F (37.2 C)  TempSrc: Oral  Resp: 14  Height: 6' (1.829 m)  Weight: 213 lb 6.4 oz (96.798 kg)  SpO2: 95%    Exam General appearance : Awake, alert, in mild distress. Speech Clear HEENT: Atraumatic and Normocephalic, pupils equally reactive to light and accomodation Neck: supple, no JVD. No cervical lymphadenopathy.  Chest:Good air entry bilaterally, no added sounds  CVS: S1 S2 irregularly irregular, A. fib Abdomen: Bowel sounds present, Non tender and not distended with no gaurding, rigidity or rebound. Extremities: B/L Lower Ext shows no edema, both legs are warm to touch Neurology: Awake alert, and oriented X 3, CN II-XII intact, Non focal Skin:No Rash Wounds:N/A  Data Review  Assessment & Plan   A stat EKG shows atrial fibrillation with rapid ventricular response  Based on this symptoms of shortness of breath and chest pain in a patient with atrial fibrillation plus RVR, will send the patient to the emergency department for further workup, medical cardioversion and possible cardiology consult.  EMS called and patient was transferred to the emergency department and hemodynamically stable condition  Followup when discharged from the ER or hospital admission  The patient was given clear instructions to go to ER or return to medical center if symptoms don't improve, worsen or new problems develop. The patient verbalized understanding. The patient was told to call to get lab results if they haven't heard anything in the next week.    Angelica Chessman, MD, [place], [place], [place] and [facility] [place], [place]   10/06/2013, 11:15 AM

## 2013-10-07 ENCOUNTER — Other Ambulatory Visit: Payer: Self-pay | Admitting: Internal Medicine

## 2013-10-09 ENCOUNTER — Telehealth: Payer: Self-pay | Admitting: Internal Medicine

## 2013-10-09 NOTE — Telephone Encounter (Signed)
Pt needs refill for Metformin and Novolog, pt uses [facility] on [address]. He has appointment scheduled on 10/19/13 but needs refill before then. Please f/u with pt.

## 2013-10-12 ENCOUNTER — Encounter: Payer: Medicare Other | Admitting: Nurse Practitioner

## 2013-10-12 ENCOUNTER — Telehealth: Payer: Self-pay | Admitting: Emergency Medicine

## 2013-10-12 MED ORDER — METFORMIN HCL 1000 MG PO TABS: 1000.0000 mg | ORAL_TABLET | Freq: Two times a day (BID) | ORAL | Status: DC

## 2013-10-12 MED ORDER — INSULIN ASPART 100 UNIT/ML FLEXPEN: 7.0000 [IU] | PEN_INJECTOR | Freq: Three times a day (TID) | SUBCUTANEOUS | Status: DC

## 2013-10-12 NOTE — Telephone Encounter (Signed)
Left message for pt tp pick scripts up at pharmacy preferred. Informed pt we can only give #30 day supply until seen by provider

## 2013-10-16 ENCOUNTER — Ambulatory Visit (INDEPENDENT_AMBULATORY_CARE_PROVIDER_SITE_OTHER): Payer: Medicare Other

## 2013-10-16 ENCOUNTER — Ambulatory Visit (INDEPENDENT_AMBULATORY_CARE_PROVIDER_SITE_OTHER): Payer: Medicare Other | Admitting: Physician Assistant

## 2013-10-16 ENCOUNTER — Encounter: Payer: Self-pay | Admitting: Physician Assistant

## 2013-10-16 VITALS — BP 150/70 | HR 80 | Ht 72.0 in | Wt 220.0 lb

## 2013-10-16 DIAGNOSIS — I2510 Atherosclerotic heart disease of native coronary artery without angina pectoris: Secondary | ICD-10-CM

## 2013-10-16 DIAGNOSIS — I4891 Unspecified atrial fibrillation: Secondary | ICD-10-CM

## 2013-10-16 DIAGNOSIS — Z5181 Encounter for therapeutic drug level monitoring: Secondary | ICD-10-CM

## 2013-10-16 DIAGNOSIS — I10 Essential (primary) hypertension: Secondary | ICD-10-CM | POA: Diagnosis not present

## 2013-10-16 DIAGNOSIS — Z7901 Long term (current) use of anticoagulants: Secondary | ICD-10-CM

## 2013-10-16 DIAGNOSIS — I5022 Chronic systolic (congestive) heart failure: Secondary | ICD-10-CM

## 2013-10-16 DIAGNOSIS — E119 Type 2 diabetes mellitus without complications: Secondary | ICD-10-CM

## 2013-10-16 DIAGNOSIS — E7800 Pure hypercholesterolemia, unspecified: Secondary | ICD-10-CM

## 2013-10-16 DIAGNOSIS — I480 Paroxysmal atrial fibrillation: Secondary | ICD-10-CM

## 2013-10-16 DIAGNOSIS — R6882 Decreased libido: Secondary | ICD-10-CM | POA: Diagnosis not present

## 2013-10-16 LAB — BASIC METABOLIC PANEL
BUN: 13 mg/dL (ref 6–23)
CALCIUM: 9.5 mg/dL (ref 8.4–10.5)
CO2: 25 mEq/L (ref 19–32)
Chloride: 103 mEq/L (ref 96–112)
Creatinine, Ser: 1.1 mg/dL (ref 0.4–1.5)
GFR: 85.26 mL/min (ref >60.00)
GLUCOSE: 208 mg/dL — AB (ref 70–99)
Potassium: 3.6 mEq/L (ref 3.5–5.1)
Sodium: 137 mEq/L (ref 135–145)

## 2013-10-16 LAB — POCT INR: INR: 2.5

## 2013-10-16 MED ORDER — FUROSEMIDE 40 MG PO TABS: 40.0000 mg | ORAL_TABLET | Freq: Two times a day (BID) | ORAL | Status: DC

## 2013-10-16 NOTE — Patient Instructions (Addendum)
DO NOT take amlodipine.   DO NOT take lovastatin.   DO NOT TAKE ibuprofen.   Increase lasix(furosemide) to 40mg  bid.  Your physician recommends that you have  lab work today--BMET.   Be sure to keep your appt Monday with your primary care doctor about your diabetes. Check your blood sugar every morning and take the log when you go to  primary care doctor Monday.   Weigh every day. Call if you weight goes up 2-3 pounds overnight or 5 pounds in 1 week or no improvement in your symptoms.  Your physician recommends that you schedule a follow-up appointment in: 10 days with PA/NP/Dr Aundra Dubin.

## 2013-10-16 NOTE — Progress Notes (Signed)
[name] [name], [name] [name], [name] [name]  [id number] Phone: [phone] Fax:  [phone]  Date:  10/16/2013   Patient ID:  [name] [name], DOB October 31, 1955, MRN [id number]   PCP:  [name] Chessman, MD  Cardiologist: Dr. Aundra Dubin   History of Present Illness: [name] [name] is a 58 y.o. male with history of HTN, diabetes, paroxysmal atrial fibrillation, CAD, and chronic CHF (suspected mixed ICM/NICM with varying EF) who presents for cardiology followup. He was admitted in 5/13 with chest pain with exertion. LHC was done showing global HK with EF 45% and diffuse, severe distal and branch vessel disease. There was no interventional option, but it was suspected that this disease could be causing his anginal-type pain. Echo at that time was read as showing EF 60-65%. He was admitted in 5/14 with hypertensive urgency and chest pain. He ruled out for MI and BP was controlled. His EF was 50-55% by echo. ETT-Sestamibi done as outpatient showed small fixed inferior defect with no ischemia but EF was 31%. He was quite hypertensive during the study. Cardiac MRI done to confirm EF in 5/14 showed EF 44%. Per Dr. Claris Gladden notes, there has been a history of medication noncompliance. He also lost his wife suddenly last year.  He recently presented to the ED 10/05/13 with weakness and chest discomfort and was found to be in atrial fibrillation with RVR. He was seen by the family medicine teaching service who discussed the case with Dr. Aundra Dubin. The patient received IV diltiazem in the ER with improvement in heart rate, then initiation of oral diltiazem. Troponin was normal x 1. pBNP 900. CBG was 487 and Cr 1.55. INR was 1.43 although the patient endorses taking Coumadin (prior INR just weeks before was 2.8). He was discharged home from the ED with a prescription for Diltiazem CD 120mg  daily and asked to follow up in our office.  He is doing better, although not quite back to baseline. He reports general fatigue,  increased SOB and orthopnea. No chest pain or exertional chest discomfort. Weight is up 7 lbs from the ER (says he fluctuates 210-220). He is back in NSR. He brought in his bag of medicine for [place] to help sort him through exactly what he should and should not be taking. Although amlodipine was on his list, he is no longer taking this (which is good since he is on diltiazem). He seemed to actually have the right regimen down for the most part. He reports compliance with his Coreg, Lasix, Hydralazine, Imdur, Losartan, Metformin, and Coumadin. He picked up a script for both Lovastatin and Atorvastatin and wanted to know which one to take. He no longer takes his Novolog though says he knows he should. Does not check his blood sugar regularly. He is also wondering if there is anything he can do to increase his sex drive. He drinks about 1-2 shots of liquor daily still.  Recent Labs: 10/20/2012: Direct LDL 135.0  07/31/2013: HDL Cholesterol 46; LDL (calc) 63; TSH 1.692  10/05/2013: ALT 13; Creatinine 1.55*; Hemoglobin 16.6; Potassium 4.5; Pro B Natriuretic peptide (BNP) 932.3*   Wt Readings from Last 3 Encounters:  10/16/13 220 lb (99.791 kg)  10/05/13 213 lb (96.616 kg)  10/05/13 213 lb 6.4 oz (96.798 kg)     Past Medical History  Diagnosis Date  . Hypertension   . Gout   . PAF (paroxysmal atrial fibrillation)     On coumadin  . Diabetes mellitus   . LIVER FUNCTION TESTS,  ABNORMAL, HX OF 12/30/2006  . Rectal bleeding 12/18/2011    Scheduled for colonoscopy.    [name] HEPATITIS B, CHRONIC 12/30/2006  . OSTEOCHONDROMA 12/30/2006     LEFT SHOULDER   . HYPERCHOLESTEROLEMIA 07/11/2010  . MITRAL REGURGITATION 12/30/2006  . COLONIC POLYPS, HX OF 12/30/2006  . CAD (coronary artery disease), native coronary artery     a. Nonobstructive CAD by cath 2013 - diffuse distal and branch vessel CAD, no severe disease in the major coronaries, LV mild global hypokinesis, EF 45%. b. ETT-Sestamibi 5/14: EF 31%, small fixed  inferior defect with no ischemia.  . Chronic CHF     a. Mixed ICM/NICM (?EtOH). EF 35% in 2008. Echo 5/13: EF 60-65%, mod LVH, EF 45% on V gram in 12/2011. EF 12/2012: EF 50-55%, mild LVH, inferobasal HK, mild MR. ETT-Ses 5/14 EF 41%. Cardiac MRI 5/14: EF 44%, mild global HK, subepicardial delayed enhancement in nonspecific RV insertion pattern.  . H/O atrial flutter 2007    a. Ablations in 2007, 2008.  [name] History of medication noncompliance     Current Outpatient Prescriptions  Medication Sig Dispense Refill  . atorvastatin (LIPITOR) 40 MG tablet Take 40 mg by mouth daily.       . carvedilol (COREG) 25 MG tablet Take 25 mg by mouth 2 (two) times daily with a meal.      . diltiazem (CARDIZEM CD) 120 MG 24 hr capsule Take 1 capsule (120 mg total) by mouth daily.      . hydrALAZINE (APRESOLINE) 50 MG tablet Take 1 tablet (50 mg total) by mouth every 8 (eight) hours.  90 tablet  0  . insulin aspart (NOVOLOG) 100 UNIT/ML FlexPen Inject 7 Units into the skin 3 (three) times daily with meals.  15 mL  0  . insulin glargine (LANTUS) 100 UNIT/ML injection 60U at bedtime      . isosorbide mononitrate (IMDUR) 60 MG 24 hr tablet Take 60 mg by mouth daily.       [name] losartan (COZAAR) 100 MG tablet Take 100 mg by mouth daily.      . metFORMIN (GLUCOPHAGE) 1000 MG tablet Take 1 tablet (1,000 mg total) by mouth 2 (two) times daily with a meal.  30 tablet  0  . warfarin (COUMADIN) 5 MG tablet Take 2.5-5 mg by mouth daily. Take one tablet (5mg ) everyday except Wednesday take 0.5 tablet (2.5 mg).      . furosemide (LASIX) 40 MG tablet Take 1 tablet (40 mg total) by mouth 2 (two) times daily.  60 tablet  3   No current facility-administered medications for this visit.    Allergies:   Ace inhibitors and Other   Social History:  The patient  reports that he quit smoking about 30 years ago. He has never used smokeless tobacco. He reports that he drinks alcohol. He reports that he uses illicit drugs (Marijuana).    Family History:  The patient's family history includes Diabetes in his mother; Hypertension in his mother.   ROS:  Please see the history of present illness. No falls, no bleeding. He has occasional blurry vision for the past several years which he was previously told was related to what sounds like diabetic retinopathy. No stroke like symptoms. All other systems reviewed and negative.   PHYSICAL EXAM:  VS:  BP 150/70  Pulse 80  Ht 6' (1.829 m)  Wt 220 lb (99.791 kg)  BMI 29.83 kg/m2 Well nourished, well developed WM in no acute distress HEENT: NCAT  Neck: mildly elevated JVD Cardiac:  normal S1, S2; RRR; no murmur Lungs:  clear to auscultation bilaterally, no wheezing, rhonchi or rales Abd: soft, nontender, no hepatomegaly Ext: no edema Skin: warm and dry Neuro:  moves all extremities spontaneously, no focal abnormalities noted  EKG:  NSR 79bpm, LVH with repolarization abnormality. Similar to prior sinus rhythm EKGs.  ASSESSMENT AND PLAN:  1. Acute on chronic combined systolic and diastolic CHF with suspected mixed ICM/NICM (EF lowest of 35% in 2008, most recent 44% by cardiac MRI 12/2012) - suspect mild volume overload today. Recheck BMET today as Cr was slightly up a few weeks ago in ER. Increase Lasix to 40mg  BID which should help with blood pressure. CHF monitoring instructions given to call for weight gain or no improvement in symptoms. Advised continued med compliance and cessation of alcohol. See below re: diltiazem. 2. Paroxysmal atrial fibrillation - Although diltiazem is less ideal for patients with CHF, this has helped him back into NSR. Will continue for now and see how he does. Amlodipine was on his med list but he has not taken it for months - he should remain off given concomitant dilt. Coumadin check today given subtherapeutic INR on ER visit. 3. HTN - recheck BP 152/84. Increase Lasix as above. He takes his other medicines at night and reports compliance with  them. 4. Uncontrolled diabetes mellitus - suspect this is playing a role in his general fatigue. He is to continue Lantus, resume Novolog, check his blood sugars once daily and take log to his PCP appointment on Monday. He needs update of his diabetic maintenance evaluation including possible ophtho referral.  5. Renal insufficiency - recent Cr 1.55 in ER. Maybe due to rapid afib. Recheck today. 6. Low libido - advised improved control of uncontrolled diabetes and blood pressure. He is going to talk to his PCP about checking testosterone level. He inquired about Viagra but is not a candidate at this time due to Imdur. Coreg may be an offender for this problem, but will defer to Dr. Aundra Dubin to weigh in on this if the problem persists beyond blood sugar control. 7. Hypercholesterolemia - LFTs ok in the ER on 2/25. I clarified that he should continue atorvastatin, not lovastatin. 8. CAD - diffuse distal and branch vessel disease not amenable to PCI on cath 5/20113. Nuc 12/2012 with small inferior fixed defect, no ischemia. No CP or exertional chest discomfort. He is on warfarin so does not take ASA given stable disease. Continue Coreg and Imdur for angina control.  Dispo:  - follow up PCP on Monday as planned to discuss diabetes. - follow up our office 10-14 days with either extender or Dr. Aundra Dubin.  Signed, Melina Copa, PA-C  10/16/2013 4:28 PM

## 2013-10-16 NOTE — Addendum Note (Signed)
Addended by: Katrine Coho on: 10/16/2013 05:31 PM   Modules accepted: Orders

## 2013-10-19 ENCOUNTER — Encounter: Payer: Self-pay | Admitting: Internal Medicine

## 2013-10-19 ENCOUNTER — Telehealth: Payer: Self-pay | Admitting: *Deleted

## 2013-10-19 ENCOUNTER — Ambulatory Visit: Payer: Medicare Other | Attending: Internal Medicine | Admitting: Internal Medicine

## 2013-10-19 VITALS — BP 131/80 | HR 70 | Temp 99.1°F | Resp 16 | Ht 72.0 in | Wt 218.0 lb

## 2013-10-19 DIAGNOSIS — I4891 Unspecified atrial fibrillation: Secondary | ICD-10-CM | POA: Diagnosis not present

## 2013-10-19 DIAGNOSIS — I2510 Atherosclerotic heart disease of native coronary artery without angina pectoris: Secondary | ICD-10-CM | POA: Insufficient documentation

## 2013-10-19 DIAGNOSIS — I10 Essential (primary) hypertension: Secondary | ICD-10-CM | POA: Diagnosis not present

## 2013-10-19 DIAGNOSIS — Z794 Long term (current) use of insulin: Secondary | ICD-10-CM | POA: Diagnosis not present

## 2013-10-19 DIAGNOSIS — I509 Heart failure, unspecified: Secondary | ICD-10-CM | POA: Insufficient documentation

## 2013-10-19 DIAGNOSIS — Z7901 Long term (current) use of anticoagulants: Secondary | ICD-10-CM | POA: Insufficient documentation

## 2013-10-19 DIAGNOSIS — I5042 Chronic combined systolic (congestive) and diastolic (congestive) heart failure: Secondary | ICD-10-CM

## 2013-10-19 DIAGNOSIS — E119 Type 2 diabetes mellitus without complications: Secondary | ICD-10-CM | POA: Diagnosis not present

## 2013-10-19 DIAGNOSIS — Z87891 Personal history of nicotine dependence: Secondary | ICD-10-CM | POA: Insufficient documentation

## 2013-10-19 DIAGNOSIS — Z5181 Encounter for therapeutic drug level monitoring: Secondary | ICD-10-CM | POA: Diagnosis not present

## 2013-10-19 DIAGNOSIS — Z09 Encounter for follow-up examination after completed treatment for conditions other than malignant neoplasm: Secondary | ICD-10-CM | POA: Insufficient documentation

## 2013-10-19 DIAGNOSIS — L259 Unspecified contact dermatitis, unspecified cause: Secondary | ICD-10-CM | POA: Insufficient documentation

## 2013-10-19 DIAGNOSIS — L309 Dermatitis, unspecified: Secondary | ICD-10-CM | POA: Insufficient documentation

## 2013-10-19 LAB — BASIC METABOLIC PANEL
BUN: 15 mg/dL (ref 6–23)
CALCIUM: 9.2 mg/dL (ref 8.4–10.5)
CO2: 26 mEq/L (ref 19–32)
Chloride: 103 mEq/L (ref 96–112)
Creat: 1.14 mg/dL (ref 0.50–1.35)
GLUCOSE: 196 mg/dL — AB (ref 70–99)
Potassium: 3.9 mEq/L (ref 3.5–5.3)
Sodium: 138 mEq/L (ref 135–145)

## 2013-10-19 LAB — POCT INR: INR: 1.7

## 2013-10-19 LAB — GLUCOSE, POCT (MANUAL RESULT ENTRY): POC Glucose: 188 mg/dl — AB (ref 70–99)

## 2013-10-19 MED ORDER — AMCINONIDE 0.1 % EX CREA: TOPICAL_CREAM | Freq: Two times a day (BID) | CUTANEOUS | Status: DC

## 2013-10-19 MED ORDER — POTASSIUM CHLORIDE ER 20 MEQ PO TBCR: 20.0000 meq | EXTENDED_RELEASE_TABLET | Freq: Every day | ORAL | Status: DC

## 2013-10-19 NOTE — Patient Instructions (Addendum)
Diabetes and Exercise Exercising regularly is important. It is not just about losing weight. It has many health benefits, such as:  Improving your overall fitness, flexibility, and endurance.  Increasing your bone density.  Helping with weight control.  Decreasing your body fat.  Increasing your muscle strength.  Reducing stress and tension.  Improving your overall health. People with diabetes who exercise gain additional benefits because exercise:  Reduces appetite.  Improves the body's use of blood sugar (glucose).  Helps lower or control blood glucose.  Decreases blood pressure.  Helps control blood lipids (such as cholesterol and triglycerides).  Improves the body's use of the hormone insulin by:  Increasing the body's insulin sensitivity.  Reducing the body's insulin needs.  Decreases the risk for heart disease because exercising:  Lowers cholesterol and triglycerides levels.  Increases the levels of good cholesterol (such as high-density lipoproteins [HDL]) in the body.  Lowers blood glucose levels. YOUR ACTIVITY PLAN  Choose an activity that you enjoy and set realistic goals. Your health care provider or diabetes educator can help you make an activity plan that works for you. You can break activities into 2 or 3 sessions throughout the day. Doing so is as good as one long session. Exercise ideas include:  Taking the dog for a walk.  Taking the stairs instead of the elevator.  Dancing to your favorite song.  Doing your favorite exercise with a friend. RECOMMENDATIONS FOR EXERCISING WITH TYPE 1 OR TYPE 2 DIABETES   Check your blood glucose before exercising. If blood glucose levels are greater than 240 mg/dL, check for urine ketones. Do not exercise if ketones are present.  Avoid injecting insulin into areas of the body that are going to be exercised. For example, avoid injecting insulin into:  The arms when playing tennis.  The legs when  jogging.  Keep a record of:  Food intake before and after you exercise.  Expected peak times of insulin action.  Blood glucose levels before and after you exercise.  The type and amount of exercise you have done.  Review your records with your health care provider. Your health care provider will help you to develop guidelines for adjusting food intake and insulin amounts before and after exercising.  If you take insulin or oral hypoglycemic agents, watch for signs and symptoms of hypoglycemia. They include:  Dizziness.  Shaking.  Sweating.  Chills.  Confusion.  Drink plenty of water while you exercise to prevent dehydration or heat stroke. Body water is lost during exercise and must be replaced.  Talk to your health care provider before starting an exercise program to make sure it is safe for you. Remember, almost any type of activity is better than none. Document Released: 10/20/2003 Document Revised: 04/01/2013 Document Reviewed: 01/06/2013 [facility] Patient Information 2014 [place]. Exercise to Lose Weight Exercise and a healthy diet may help you lose weight. Your doctor may suggest specific exercises. EXERCISE IDEAS AND TIPS  Choose low-cost things you enjoy doing, such as walking, bicycling, or exercising to workout videos.  Take stairs instead of the elevator.  Walk during your lunch break.  Park your car further away from work or school.  Go to a gym or an exercise class.  Start with 5 to 10 minutes of exercise each day. Build up to 30 minutes of exercise 4 to 6 days a week.  Wear shoes with good support and comfortable clothes.  Stretch before and after working out.  Work out until you breathe harder  and your heart beats faster.  Drink extra water when you exercise.  Do not do so much that you hurt yourself, feel dizzy, or get very short of breath. Exercises that burn about 150 calories:  Running 1  miles in 15 minutes.  Playing volleyball  for 45 to 60 minutes.  Washing and waxing a car for 45 to 60 minutes.  Playing touch football for 45 minutes.  Walking 1  miles in 35 minutes.  Pushing a stroller 1  miles in 30 minutes.  Playing basketball for 30 minutes.  Raking leaves for 30 minutes.  Bicycling 5 miles in 30 minutes.  Walking 2 miles in 30 minutes.  Dancing for 30 minutes.  Shoveling snow for 15 minutes.  Swimming laps for 20 minutes.  Walking up stairs for 15 minutes.  Bicycling 4 miles in 15 minutes.  Gardening for 30 to 45 minutes.  Jumping rope for 15 minutes.  Washing windows or floors for 45 to 60 minutes. Document Released: 09/01/2010 Document Revised: 10/22/2011 Document Reviewed: 09/01/2010 [facility] Patient Information 2014 [place], [place].

## 2013-10-19 NOTE — Progress Notes (Signed)
Patient ID: [name] [name], male   DOB: 05-09-1956, 58 y.o.   MRN: [id number]   [name] [name], is a 58 y.o. male  XNA:[id number]  RKY:[id number]  DOB - [birth year]  Chief Complaint  Patient presents with  . Follow-up        Subjective:   [name] [name] is a 58 y.o. male here today for a follow up visit. Patient has history of HTN, diabetes, paroxysmal atrial fibrillation, CAD, and chronic CHF (suspected mixed ICM/NICM with varying EF). He was admitted in 5/13 with chest pain with exertion. LHC was done showing global HK with EF 45% and diffuse, severe distal and branch vessel disease. There was no interventional option, but it was suspected that this disease could be causing his anginal-type pain. Echo at that time was read as showing EF 60-65%. He was admitted in 5/14 with hypertensive urgency and chest pain. He ruled out for MI and BP was controlled. His EF was 50-55% by echo. ETT-Sestamibi done as outpatient showed small fixed inferior defect with no ischemia but EF was 31%. Cardiac MRI done to confirm EF in 5/14 showed EF 44%. Per Dr. Claris Gladden notes, there has been a history of medication noncompliance. He also lost his wife suddenly last year. He has quit smoking. Last month he was seen here in the clinic and found to be in atrial fibrillation with rapid ventricular response, he was sent to the ER was managed appropriately. Patient has no complaint today. He has his medications. Patient has No headache, No chest pain, No abdominal pain - No Nausea, No new weakness tingling or numbness, No Cough - SOB.  Problem  Atrial Fibrillation  Diabetes Mellitus, Antepartum(648.03)  Diabetes  Anticoagulation Goal of Inr 2 to 3  Eczema    ALLERGIES: Allergies  Allergen Reactions  . Ace Inhibitors     Cough  . Other Hives    Patient reports developing hives after receiving "some antibiotic years ago at [facility]". He does not know which antibiotic.    PAST MEDICAL HISTORY: Past  Medical History  Diagnosis Date  . Hypertension   . Gout   . PAF (paroxysmal atrial fibrillation)     On coumadin  . Diabetes mellitus   . LIVER FUNCTION TESTS, ABNORMAL, HX OF 12/30/2006  . Rectal bleeding 12/18/2011    Scheduled for colonoscopy.    [name] HEPATITIS B, CHRONIC 12/30/2006  . OSTEOCHONDROMA 12/30/2006     LEFT SHOULDER   . HYPERCHOLESTEROLEMIA 07/11/2010  . MITRAL REGURGITATION 12/30/2006  . COLONIC POLYPS, HX OF 12/30/2006  . CAD (coronary artery disease), native coronary artery     a. Nonobstructive CAD by cath 2013 - diffuse distal and branch vessel CAD, no severe disease in the major coronaries, LV mild global hypokinesis, EF 45%. b. ETT-Sestamibi 5/14: EF 31%, small fixed inferior defect with no ischemia.  . Chronic CHF     a. Mixed ICM/NICM (?EtOH). EF 35% in 2008. Echo 5/13: EF 60-65%, mod LVH, EF 45% on V gram in 12/2011. EF 12/2012: EF 50-55%, mild LVH, inferobasal HK, mild MR. ETT-Ses 5/14 EF 41%. Cardiac MRI 5/14: EF 44%, mild global HK, subepicardial delayed enhancement in nonspecific RV insertion pattern.  . H/O atrial flutter 2007    a. Ablations in 2007, 2008.  [name] History of medication noncompliance   . History of alcohol abuse     MEDICATIONS AT HOME: Prior to Admission medications   Medication Sig Start Date End Date Taking? Authorizing Provider  atorvastatin (LIPITOR) 40 MG  tablet Take 40 mg by mouth daily.  05/05/13  Yes Larey Dresser, MD  carvedilol (COREG) 25 MG tablet Take 25 mg by mouth 2 (two) times daily with a meal.   Yes Historical Provider, MD  diltiazem (CARDIZEM CD) 120 MG 24 hr capsule Take 1 capsule (120 mg total) by mouth daily. 10/16/13  Yes Dayna N Dunn, PA-C  furosemide (LASIX) 40 MG tablet Take 1 tablet (40 mg total) by mouth 2 (two) times daily. 10/16/13  Yes Dayna N Dunn, PA-C  hydrALAZINE (APRESOLINE) 50 MG tablet Take 1 tablet (50 mg total) by mouth every 8 (eight) hours. 08/01/13  Yes Maryann Mikhail, DO  insulin aspart (NOVOLOG) 100 UNIT/ML  FlexPen Inject 7 Units into the skin 3 (three) times daily with meals. 10/12/13  Yes Angelica Chessman, MD  insulin glargine (LANTUS) 100 UNIT/ML injection 60U at bedtime 10/16/13  Yes Dayna N Dunn, PA-C  isosorbide mononitrate (IMDUR) 60 MG 24 hr tablet Take 60 mg by mouth daily.  05/05/13  Yes Larey Dresser, MD  losartan (COZAAR) 100 MG tablet Take 100 mg by mouth daily.   Yes Historical Provider, MD  metFORMIN (GLUCOPHAGE) 1000 MG tablet Take 1 tablet (1,000 mg total) by mouth 2 (two) times daily with a meal. 10/12/13  Yes Angelica Chessman, MD  Potassium Chloride ER 20 MEQ TBCR Take 20 mEq by mouth daily. 10/19/13  Yes Dayna N Dunn, PA-C  warfarin (COUMADIN) 5 MG tablet Take 2.5-5 mg by mouth daily. Take one tablet (5mg ) everyday except Wednesday take 0.5 tablet (2.5 mg).   Yes Historical Provider, MD  amcinonide (CYCLOCORT) 0.1 % cream Apply topically 2 (two) times daily. 10/19/13   Angelica Chessman, MD     Objective:   Filed Vitals:   10/19/13 1228  BP: 131/80  Pulse: 70  Temp: 99.1 F (37.3 C)  TempSrc: Oral  Resp: 16  Height: 6' (1.829 m)  Weight: 218 lb (98.884 kg)  SpO2: 94%    Exam General appearance : Awake, alert, not in any distress. Speech Clear. Not toxic looking HEENT: Atraumatic and Normocephalic, pupils equally reactive to light and accomodation Neck: supple, no JVD. No cervical lymphadenopathy.  Chest:Good air entry bilaterally, no added sounds  CVS: S1 S2 regular, no murmurs.  Abdomen: Bowel sounds present, Non tender and not distended with no gaurding, rigidity or rebound. Extremities: B/L Lower Ext shows no edema, both legs are warm to touch Neurology: Awake alert, and oriented X 3, CN II-XII intact, Non focal Skin:No Rash Wounds:N/A  Data Review Lab Results  Component Value Date   HGBA1C 7.4* 07/31/2013   HGBA1C 6.9 07/31/2013   HGBA1C 11.1* 12/01/2012     Assessment & Plan   1. Atrial fibrillation Continue Cardizem 120 milligrams tablet by mouth to -  INR  2. Diabetes  - Glucose (CBG)  3. Coronary artery disease  - Basic Metabolic Panel Continue all medications as prescribed including beta blockers  4. Anticoagulation goal of INR 2 to 3 Were discussed INR goals  5. Eczema  - amcinonide (CYCLOCORT) 0.1 % cream; Apply topically 2 (two) times daily.  Dispense: 30 g; Refill: 0  Patient has been extensively counseled on nutrition and exercise  Return in about 3 months (around 01/19/2014), or if symptoms worsen or fail to improve, for Routine Follow Up, HTN/DM.  The patient was given clear instructions to go to ER or return to medical center if symptoms don't improve, worsen or new problems develop. The patient verbalized understanding. The  patient was told to call to get lab results if they haven't heard anything in the next week.   This note has been created with Surveyor, quantity. Any transcriptional errors are unintentional.    Angelica Chessman, MD, [place], [place], [place] and [place], [place]   10/19/2013, 1:04 PM

## 2013-10-19 NOTE — Telephone Encounter (Signed)
lmptcb for lab results and med change with repeat lab work

## 2013-10-19 NOTE — Progress Notes (Signed)
HFU Pt was admitted to the hospital with an irregular heart beat. Pt is here following up on his diabetes.

## 2013-10-28 ENCOUNTER — Ambulatory Visit: Payer: Medicare Other | Admitting: Physician Assistant

## 2013-10-28 ENCOUNTER — Other Ambulatory Visit: Payer: Medicare Other

## 2013-11-06 ENCOUNTER — Encounter: Payer: Self-pay | Admitting: Physician Assistant

## 2013-11-07 ENCOUNTER — Other Ambulatory Visit: Payer: Self-pay | Admitting: Internal Medicine

## 2013-11-08 ENCOUNTER — Emergency Department (HOSPITAL_COMMUNITY)
Admission: EM | Admit: 2013-11-08 | Discharge: 2013-11-08 | Disposition: A | Discharge status: Home / Self Care | Payer: Medicare Other | Attending: Emergency Medicine | Admitting: Emergency Medicine

## 2013-11-08 ENCOUNTER — Encounter (HOSPITAL_COMMUNITY): Payer: Self-pay | Admitting: Emergency Medicine

## 2013-11-08 DIAGNOSIS — Z794 Long term (current) use of insulin: Secondary | ICD-10-CM | POA: Diagnosis not present

## 2013-11-08 DIAGNOSIS — Z860100 Personal history of colon polyps, unspecified: Secondary | ICD-10-CM | POA: Insufficient documentation

## 2013-11-08 DIAGNOSIS — Z9861 Coronary angioplasty status: Secondary | ICD-10-CM | POA: Diagnosis not present

## 2013-11-08 DIAGNOSIS — E119 Type 2 diabetes mellitus without complications: Secondary | ICD-10-CM | POA: Insufficient documentation

## 2013-11-08 DIAGNOSIS — M109 Gout, unspecified: Secondary | ICD-10-CM | POA: Diagnosis not present

## 2013-11-08 DIAGNOSIS — I509 Heart failure, unspecified: Secondary | ICD-10-CM | POA: Diagnosis not present

## 2013-11-08 DIAGNOSIS — E7800 Pure hypercholesterolemia, unspecified: Secondary | ICD-10-CM | POA: Diagnosis not present

## 2013-11-08 DIAGNOSIS — I10 Essential (primary) hypertension: Secondary | ICD-10-CM | POA: Insufficient documentation

## 2013-11-08 DIAGNOSIS — Z87891 Personal history of nicotine dependence: Secondary | ICD-10-CM | POA: Diagnosis not present

## 2013-11-08 DIAGNOSIS — I318 Other specified diseases of pericardium: Secondary | ICD-10-CM | POA: Diagnosis not present

## 2013-11-08 DIAGNOSIS — Z8619 Personal history of other infectious and parasitic diseases: Secondary | ICD-10-CM | POA: Diagnosis not present

## 2013-11-08 DIAGNOSIS — R319 Hematuria, unspecified: Secondary | ICD-10-CM | POA: Insufficient documentation

## 2013-11-08 DIAGNOSIS — Z79899 Other long term (current) drug therapy: Secondary | ICD-10-CM | POA: Insufficient documentation

## 2013-11-08 DIAGNOSIS — Z7901 Long term (current) use of anticoagulants: Secondary | ICD-10-CM | POA: Diagnosis not present

## 2013-11-08 DIAGNOSIS — R109 Unspecified abdominal pain: Secondary | ICD-10-CM | POA: Diagnosis not present

## 2013-11-08 DIAGNOSIS — I2510 Atherosclerotic heart disease of native coronary artery without angina pectoris: Secondary | ICD-10-CM | POA: Insufficient documentation

## 2013-11-08 DIAGNOSIS — R739 Hyperglycemia, unspecified: Secondary | ICD-10-CM

## 2013-11-08 LAB — URINALYSIS, ROUTINE W REFLEX MICROSCOPIC
BILIRUBIN URINE: NEGATIVE
Glucose, UA: >1000 mg/dL — AB
KETONES UR: NEGATIVE mg/dL
LEUKOCYTES UA: NEGATIVE
NITRITE: NEGATIVE
Protein, ur: NEGATIVE mg/dL
Specific Gravity, Urine: 1.035 — ABNORMAL HIGH (ref 1.005–1.030)
Urobilinogen, UA: 0.2 mg/dL (ref 0.0–1.0)
pH: 5.5 (ref 5.0–8.0)

## 2013-11-08 LAB — URINE MICROSCOPIC-ADD ON

## 2013-11-08 LAB — CBC
HEMATOCRIT: 42.8 % (ref 39.0–52.0)
Hemoglobin: 15 g/dL (ref 13.0–17.0)
MCH: 28.6 pg (ref 26.0–34.0)
MCHC: 35 g/dL (ref 30.0–36.0)
MCV: 81.5 fL (ref 78.0–100.0)
PLATELETS: 145 10*3/uL — AB (ref 150–400)
RBC: 5.25 MIL/uL (ref 4.22–5.81)
RDW: 13.9 % (ref 11.5–15.5)
WBC: 6 10*3/uL (ref 4.0–10.5)

## 2013-11-08 LAB — CBG MONITORING, ED: GLUCOSE-CAPILLARY: 333 mg/dL — AB (ref 70–99)

## 2013-11-08 LAB — PROTIME-INR
INR: 2.04 — ABNORMAL HIGH (ref 0.00–1.49)
Prothrombin Time: 22.4 seconds — ABNORMAL HIGH (ref 11.6–15.2)

## 2013-11-08 NOTE — ED Notes (Signed)
CBG 333. °

## 2013-11-08 NOTE — ED Notes (Signed)
Pt presents to department for evaluation of hematuria this morning. Pt denies abdominal pain. Denies dysuria. Concerned he could have UTI. Pt is alert and oriented x4. No signs of distress noted at present.

## 2013-11-08 NOTE — ED Provider Notes (Signed)
  Medical screening examination/treatment/procedure(s) were performed by non-physician practitioner and as supervising physician I was immediately available for consultation/collaboration.   EKG Interpretation None         Carmin Muskrat, MD 11/08/13 1310

## 2013-11-08 NOTE — Discharge Instructions (Signed)
Please read and follow all provided instructions.  Your diagnoses today include:  1. Hematuria   2. Hyperglycemia    Tests performed today include:  Urine test - shows blood in urine, no infection  Blood sugar - high  Blood counts - normal platelets  Vital signs. See below for your results today.   Medications prescribed:   None  Take any prescribed medications only as directed.  Home care instructions:  Follow any educational materials contained in this packet.  Avoid ibuprofen, aleve, aspirin as these can worsen bleeding  Follow-up instructions: Please follow-up with your primary care provider in the next 7 days for further evaluation of your symptoms. If you do not have a primary care doctor -- see below for referral information.   Return instructions:   Please return to the Emergency Department if you experience worsening symptoms.   Please return if you have any other emergent concerns.  Additional Information:  Your vital signs today were: BP 186/109   Pulse 72   Temp(Src) 97.8 F (36.6 C) (Oral)   Resp 18   SpO2 94% If your blood pressure (BP) was elevated above 135/85 this visit, please have this repeated by your doctor within one month. --------------

## 2013-11-08 NOTE — ED Provider Notes (Signed)
CSN: [id number]     Arrival date & time 11/08/13  0913 History   First MD Initiated Contact with Patient 11/08/13 [id number]     Chief Complaint  Patient presents with  . Hematuria   (Consider location/radiation/quality/duration/timing/severity/associated sxs/prior Treatment) HPI Comments: Patients with multiple past medical problems, on Coumadin due to A. Fib -- presents with complaint of blood in his urine that was first noticed at 4 AM today. Patient voided x2 with pink urine. Patient denies dysuria. He does have some mild pain in his left groin but no suprapubic pain or other abdominal pain. He denies easy bleeding of his gums, hemoptysis, hematemesis, hematochezia or melena. No easy bruising. Onset of symptoms acute. Course is constant. Nothing makes symptoms better or worse.  The history is provided by the patient and medical records.    Past Medical History  Diagnosis Date  . Hypertension   . Gout   . PAF (paroxysmal atrial fibrillation)     On coumadin  . Diabetes mellitus   . LIVER FUNCTION TESTS, ABNORMAL, HX OF 12/30/2006  . Rectal bleeding 12/18/2011    Scheduled for colonoscopy.    [name] HEPATITIS B, CHRONIC 12/30/2006  . OSTEOCHONDROMA 12/30/2006     LEFT SHOULDER   . HYPERCHOLESTEROLEMIA 07/11/2010  . MITRAL REGURGITATION 12/30/2006  . COLONIC POLYPS, HX OF 12/30/2006  . CAD (coronary artery disease), native coronary artery     a. Nonobstructive CAD by cath 2013 - diffuse distal and branch vessel CAD, no severe disease in the major coronaries, LV mild global hypokinesis, EF 45%. b. ETT-Sestamibi 5/14: EF 31%, small fixed inferior defect with no ischemia.  . Chronic CHF     a. Mixed ICM/NICM (?EtOH). EF 35% in 2008. Echo 5/13: EF 60-65%, mod LVH, EF 45% on V gram in 12/2011. EF 12/2012: EF 50-55%, mild LVH, inferobasal HK, mild MR. ETT-Ses 5/14 EF 41%. Cardiac MRI 5/14: EF 44%, mild global HK, subepicardial delayed enhancement in nonspecific RV insertion pattern.  . H/O atrial flutter  2007    a. Ablations in 2007, 2008.  [name] History of medication noncompliance   . History of alcohol abuse    Past Surgical History  Procedure Laterality Date  . Osteochondroma excision  1972  . Coronary angioplasty  12/05/01  . A flutter ablation  2007, 2008    catheter ablation   . Eye surgery     Family History  Problem Relation Age of Onset  . Diabetes Mother   . Hypertension Mother    History  Substance Use Topics  . Smoking status: Former Smoker -- 0.20 packs/day for 18 years    Quit date: 08/14/1983  . Smokeless tobacco: Never Used  . Alcohol Use: 0.0 oz/week     Comment: 1-2 shots of liquor daily    Review of Systems  Constitutional: Negative for fever.  HENT: Negative for rhinorrhea and sore throat.   Eyes: Negative for redness.  Respiratory: Negative for cough.   Cardiovascular: Negative for chest pain.  Gastrointestinal: Negative for nausea, vomiting, abdominal pain and diarrhea.  Genitourinary: Positive for hematuria. Negative for dysuria, frequency, discharge, penile pain and testicular pain.  Musculoskeletal: Negative for myalgias.  Skin: Negative for rash.  Neurological: Negative for headaches.  Hematological: Does not bruise/bleed easily.    Allergies  Ace inhibitors and Other  Home Medications   Current Outpatient Rx  Name  Route  Sig  Dispense  Refill  . amcinonide (CYCLOCORT) 0.1 % cream   Topical   Apply topically  2 (two) times daily.   30 g   0   . atorvastatin (LIPITOR) 40 MG tablet   Oral   Take 40 mg by mouth daily.          . carvedilol (COREG) 25 MG tablet   Oral   Take 25 mg by mouth 2 (two) times daily with a meal.         . diltiazem (CARDIZEM CD) 120 MG 24 hr capsule   Oral   Take 1 capsule (120 mg total) by mouth daily.         . furosemide (LASIX) 40 MG tablet   Oral   Take 1 tablet (40 mg total) by mouth 2 (two) times daily.   60 tablet   3   . hydrALAZINE (APRESOLINE) 50 MG tablet   Oral   Take 1 tablet (50  mg total) by mouth every 8 (eight) hours.   90 tablet   0   . insulin aspart (NOVOLOG) 100 UNIT/ML FlexPen   Subcutaneous   Inject 7 Units into the skin 3 (three) times daily with meals.   15 mL   0   . insulin glargine (LANTUS) 100 UNIT/ML injection      60U at bedtime         . isosorbide mononitrate (IMDUR) 60 MG 24 hr tablet   Oral   Take 60 mg by mouth daily.          [name] losartan (COZAAR) 100 MG tablet   Oral   Take 100 mg by mouth daily.         . metFORMIN (GLUCOPHAGE) 1000 MG tablet   Oral   Take 1 tablet (1,000 mg total) by mouth 2 (two) times daily with a meal.   30 tablet   0   . Potassium Chloride ER 20 MEQ TBCR   Oral   Take 20 mEq by mouth daily.   30 tablet   11   . warfarin (COUMADIN) 5 MG tablet   Oral   Take 2.5-5 mg by mouth daily. Take one tablet (5mg ) everyday except Wednesday take 0.5 tablet (2.5 mg).          BP 181/97  Pulse 70  Temp(Src) 97.8 F (36.6 C) (Oral)  Resp 18  SpO2 95%  Physical Exam  Nursing note and vitals reviewed. Constitutional: He appears well-developed and well-nourished.  HENT:  Head: Normocephalic and atraumatic.  Eyes: Conjunctivae are normal. Right eye exhibits no discharge. Left eye exhibits no discharge.  Neck: Normal range of motion. Neck supple.  Cardiovascular: Normal rate, regular rhythm and normal heart sounds.   Pulmonary/Chest: Effort normal and breath sounds normal.  Abdominal: Soft. There is no tenderness. Hernia confirmed negative in the right inguinal area and confirmed negative in the left inguinal area.  Genitourinary: Right testis shows no mass, no swelling and no tenderness. Left testis shows tenderness (mild, L groin crease). Left testis shows no mass and no swelling. Uncircumcised. No discharge found.  Lymphadenopathy:       Right: No inguinal adenopathy present.       Left: No inguinal adenopathy present.  Neurological: He is alert.  Skin: Skin is warm and dry.  Psychiatric: He has  a normal mood and affect.    ED Course  Procedures (including critical care time) Labs Review Labs Reviewed  URINALYSIS, ROUTINE W REFLEX MICROSCOPIC - Abnormal; Notable for the following:    Specific Gravity, Urine 1.035 (*)    Glucose, UA >1000 (*)  Hgb urine dipstick LARGE (*)    All other components within normal limits  PROTIME-INR - Abnormal; Notable for the following:    Prothrombin Time 22.4 (*)    INR 2.04 (*)    All other components within normal limits  CBC - Abnormal; Notable for the following:    Platelets 145 (*)    All other components within normal limits  CBG MONITORING, ED - Abnormal; Notable for the following:    Glucose-Capillary 333 (*)    All other components within normal limits  URINE CULTURE  URINE MICROSCOPIC-ADD ON   Imaging Review No results found.   EKG Interpretation None      9:45 AM Patient seen and examined. Work-up initiated.   Vital signs reviewed and are as follows: Filed Vitals:   11/08/13 1228  BP: 181/97  Pulse: 70  Temp:   Resp: 18   Patient informed of results. Urged PCP f/u for recheck -- evaluation will be needed for painless hematuria if it persists.   Patient urged to return with worsening symptoms or other concerns. Patient verbalized understanding and agrees with plan.    MDM   Final diagnoses:  Hematuria  Hyperglycemia    Patient with painless hematuria. Patient low therapeutic INR. Plt normal. No infection on UA, culture sent. No s/s of ureteral stone. Bleeding is mild, <12 hrs. At this point, close monitoring and PCP f/u indicated.    Hyperglycemia noted. Do not suspect DKA, neg ketones in urine.    [name] Cater, PA-C 11/08/13 1252

## 2013-11-09 ENCOUNTER — Telehealth: Payer: Self-pay | Admitting: Pharmacist

## 2013-11-09 LAB — URINE CULTURE
CULTURE: NO GROWTH
Colony Count: NO GROWTH

## 2013-11-09 NOTE — Telephone Encounter (Signed)
Got a letter from [place] trying to determine if patient was on atoravastatin, lovastatin, or both.  Both appear on med list currently, but he should only be on atorvastatin.  I called patient and went through his current medications, and he is indeed only taking atorvastatin.  He does not have lovastatin, and understands to not take it if he finds a bottle of this.  Med list updated

## 2013-11-10 ENCOUNTER — Other Ambulatory Visit: Payer: Self-pay | Admitting: Internal Medicine

## 2013-11-11 ENCOUNTER — Ambulatory Visit: Payer: Medicare Other | Attending: Internal Medicine | Admitting: Internal Medicine

## 2013-11-11 ENCOUNTER — Other Ambulatory Visit: Payer: Self-pay | Admitting: Emergency Medicine

## 2013-11-11 VITALS — BP 164/89 | HR 87 | Temp 98.0°F | Resp 16 | Ht 72.0 in | Wt 214.0 lb

## 2013-11-11 DIAGNOSIS — I5022 Chronic systolic (congestive) heart failure: Secondary | ICD-10-CM

## 2013-11-11 DIAGNOSIS — I2510 Atherosclerotic heart disease of native coronary artery without angina pectoris: Secondary | ICD-10-CM

## 2013-11-11 DIAGNOSIS — R310 Gross hematuria: Secondary | ICD-10-CM | POA: Diagnosis not present

## 2013-11-11 DIAGNOSIS — E119 Type 2 diabetes mellitus without complications: Secondary | ICD-10-CM | POA: Diagnosis not present

## 2013-11-11 DIAGNOSIS — I4891 Unspecified atrial fibrillation: Secondary | ICD-10-CM

## 2013-11-11 LAB — POCT URINALYSIS DIPSTICK
BILIRUBIN UA: NEGATIVE
Blood, UA: NEGATIVE
Glucose, UA: 500
Ketones, UA: NEGATIVE
LEUKOCYTES UA: NEGATIVE
Nitrite, UA: NEGATIVE
PROTEIN UA: NEGATIVE
SPEC GRAV UA: 1.02
Urobilinogen, UA: 0.2
pH, UA: 5.5

## 2013-11-11 LAB — LIPID PANEL
CHOL/HDL RATIO: 2.9 ratio
CHOLESTEROL: 135 mg/dL (ref 0–200)
HDL: 46 mg/dL (ref >39)
LDL Cholesterol: 54 mg/dL (ref 0–99)
Triglycerides: 175 mg/dL — ABNORMAL HIGH (ref <150)
VLDL: 35 mg/dL (ref 0–40)

## 2013-11-11 LAB — POCT GLYCOSYLATED HEMOGLOBIN (HGB A1C): Hemoglobin A1C: 11

## 2013-11-11 LAB — POCT INR: INR: 1.9

## 2013-11-11 LAB — GLUCOSE, POCT (MANUAL RESULT ENTRY): POC GLUCOSE: 339 mg/dL — AB (ref 70–99)

## 2013-11-11 MED ORDER — METFORMIN HCL 1000 MG PO TABS: 1000.0000 mg | ORAL_TABLET | Freq: Two times a day (BID) | ORAL | Status: DC

## 2013-11-11 MED ORDER — INSULIN GLARGINE 100 UNIT/ML ~~LOC~~ SOLN: 45.0000 [IU] | Freq: Every day | SUBCUTANEOUS | Status: DC

## 2013-11-11 MED ORDER — INSULIN GLARGINE 100 UNIT/ML ~~LOC~~ SOLN: 75.0000 [IU] | Freq: Every day | SUBCUTANEOUS | Status: DC

## 2013-11-11 MED ORDER — CIPROFLOXACIN HCL 500 MG PO TABS: 500.0000 mg | ORAL_TABLET | Freq: Two times a day (BID) | ORAL | Status: DC

## 2013-11-11 MED ORDER — INSULIN GLARGINE 100 UNIT/ML ~~LOC~~ SOLN: SUBCUTANEOUS | Status: DC

## 2013-11-11 NOTE — Progress Notes (Addendum)
LCSW met with patient in order to assess mood symptoms since the passing of his wife about 6 months ago. Patient seemed to be more concerned with his children than with himself in managing their mood.  Patient states that he drinks almost daily and states that he wishes to change.  Patient made appointment for both himself and one of his son's to follow up with this Education officer, museum.  Christene Lye MSW, LCSW Duration - 30 minutes

## 2013-11-11 NOTE — Progress Notes (Signed)
Patient ID: [name] [name], male   DOB: [birth year], 58 y.o.   MRN: [id number]   CC:  HPI: 58 year old male with a history of of HTN, diabetes, paroxysmal atrial fibrillation, CAD, and chronic CHF (suspected mixed ICM/NICM with varying EF) comes in today for evaluation of hematuria.[name] He first noticed some gross amount of bleeding in his urine and he first woke up on 3/29. And he went to the ER the patient was found to have microscopic hematuria. He subsequently saw blood in his semen later on. The patient's INR was therapeutic. Urinalysis was negative for infection but had 21-50 RBCs. Patient also states that he is noncompliant with his NovoLog and Lantus and ran out of his metformin 2 days ago. Her CBGs 339 today.     Allergies  Allergen Reactions  . Ace Inhibitors     Cough  . Other Hives    Patient reports developing hives after receiving "some antibiotic years ago at [facility]". He does not know which antibiotic.   Past Medical History  Diagnosis Date  . Hypertension   . Gout   . PAF (paroxysmal atrial fibrillation)     On coumadin  . Diabetes mellitus   . LIVER FUNCTION TESTS, ABNORMAL, HX OF 12/30/2006  . Rectal bleeding 12/18/2011    Scheduled for colonoscopy.    [name] HEPATITIS B, CHRONIC 12/30/2006  . OSTEOCHONDROMA 12/30/2006     LEFT SHOULDER   . HYPERCHOLESTEROLEMIA 07/11/2010  . MITRAL REGURGITATION 12/30/2006  . COLONIC POLYPS, HX OF 12/30/2006  . CAD (coronary artery disease), native coronary artery     a. Nonobstructive CAD by cath 2013 - diffuse distal and branch vessel CAD, no severe disease in the major coronaries, LV mild global hypokinesis, EF 45%. b. ETT-Sestamibi 5/14: EF 31%, small fixed inferior defect with no ischemia.  . Chronic CHF     a. Mixed ICM/NICM (?EtOH). EF 35% in 2008. Echo 5/13: EF 60-65%, mod LVH, EF 45% on V gram in 12/2011. EF 12/2012: EF 50-55%, mild LVH, inferobasal HK, mild MR. ETT-Ses 5/14 EF 41%. Cardiac MRI 5/14: EF 44%, mild global HK,  subepicardial delayed enhancement in nonspecific RV insertion pattern.  . H/O atrial flutter 2007    a. Ablations in 2007, 2008.  [name] History of medication noncompliance   . History of alcohol abuse    Current Outpatient Prescriptions on File Prior to Visit  Medication Sig Dispense Refill  . amcinonide (CYCLOCORT) 0.1 % cream Apply topically 2 (two) times daily.  30 g  0  . amLODipine (NORVASC) 5 MG tablet Take 5 mg by mouth daily.      . Aspirin-Salicylamide-Caffeine (BC HEADACHE POWDER PO) Take 1 packet by mouth daily as needed (headache).      [name] atorvastatin (LIPITOR) 40 MG tablet Take 40 mg by mouth daily.       . carvedilol (COREG) 25 MG tablet Take 25 mg by mouth 2 (two) times daily with a meal.      . diltiazem (CARDIZEM CD) 120 MG 24 hr capsule Take 1 capsule (120 mg total) by mouth daily.      . furosemide (LASIX) 40 MG tablet Take 40 mg by mouth daily.      . hydrALAZINE (APRESOLINE) 50 MG tablet Take 1 tablet (50 mg total) by mouth every 8 (eight) hours.  90 tablet  0  . insulin aspart (NOVOLOG) 100 UNIT/ML FlexPen Inject 7 Units into the skin 3 (three) times daily with meals.  15 mL  0  .  insulin glargine (LANTUS) 100 UNIT/ML injection 60U at bedtime  10 mL  3  . Iron TABS Take 1 tablet by mouth daily.      . isosorbide mononitrate (IMDUR) 60 MG 24 hr tablet Take 60 mg by mouth daily.       [name] losartan (COZAAR) 100 MG tablet Take 100 mg by mouth daily.      . metFORMIN (GLUCOPHAGE) 1000 MG tablet Take 1 tablet (1,000 mg total) by mouth 2 (two) times daily with a meal.  60 tablet  2  . Potassium Chloride ER 20 MEQ TBCR Take 20 mEq by mouth daily.  30 tablet  11  . warfarin (COUMADIN) 5 MG tablet Take 2.5-5 mg by mouth daily. Take one tablet (5mg ) everyday except Wednesday take 0.5 tablet (2.5 mg).       No current facility-administered medications on file prior to visit.   Family History  Problem Relation Age of Onset  . Diabetes Mother   . Hypertension Mother    History    Social History  . Marital Status: Married    Spouse Name: N/A    Number of Children: 2  . Years of Education: N/A   Occupational History  .     Social History Main Topics  . Smoking status: Former Smoker -- 0.20 packs/day for 18 years    Quit date: 08/14/1983  . Smokeless tobacco: Never Used  . Alcohol Use: 0.0 oz/week     Comment: 1-2 shots of liquor daily  . Drug Use: Yes    Special: Marijuana     Comment: h/o recreational use  . Sexual Activity: Yes   Other Topics Concern  . Not on file   Social History Narrative  . No narrative on file    Review of Systems  Constitutional: Negative for fever, chills, diaphoresis, activity change, appetite change and fatigue.  HENT: Negative for ear pain, nosebleeds, congestion, facial swelling, rhinorrhea, neck pain, neck stiffness and ear discharge.   Eyes: Negative for pain, discharge, redness, itching and visual disturbance.  Respiratory: Negative for cough, choking, chest tightness, shortness of breath, wheezing and stridor.   Cardiovascular: Negative for chest pain, palpitations and leg swelling.  Gastrointestinal: Negative for abdominal distention.  Genitourinary: Negative for dysuria, urgency, frequency, hematuria, flank pain, decreased urine volume, difficulty urinating and dyspareunia.  Musculoskeletal: Negative for back pain, joint swelling, arthralgias and gait problem.  Neurological: Negative for dizziness, tremors, seizures, syncope, facial asymmetry, speech difficulty, weakness, light-headedness, numbness and headaches.  Hematological: Negative for adenopathy. Does not bruise/bleed easily.  Psychiatric/Behavioral: Negative for hallucinations, behavioral problems, confusion, dysphoric mood, decreased concentration and agitation.    Objective:   Filed Vitals:   11/11/13 1435  BP: 164/89  Pulse: 87  Temp: 98 F (36.7 C)  Resp: 16    Physical Exam  Constitutional: Appears well-developed and well-nourished. No  distress.  HENT: Normocephalic. External right and left ear normal. Oropharynx is clear and moist.  Eyes: Conjunctivae and EOM are normal. PERRLA, no scleral icterus.  Neck: Normal ROM. Neck supple. No JVD. No tracheal deviation. No thyromegaly.  CVS: RRR, S1/S2 +, no murmurs, no gallops, no carotid bruit.  Pulmonary: Effort and breath sounds normal, no stridor, rhonchi, wheezes, rales.  Abdominal: Soft. BS +,  no distension, tenderness, rebound or guarding.  Musculoskeletal: Normal range of motion. No edema and no tenderness.  Lymphadenopathy: No lymphadenopathy noted, cervical, inguinal. Neuro: Alert. Normal reflexes, muscle tone coordination. No cranial nerve deficit. Skin: Skin is warm and  dry. No rash noted. Not diaphoretic. No erythema. No pallor.  Psychiatric: Normal mood and affect. Behavior, judgment, thought content normal.   Lab Results  Component Value Date   WBC 6.0 11/08/2013   HGB 15.0 11/08/2013   HCT 42.8 11/08/2013   MCV 81.5 11/08/2013   PLT 145* 11/08/2013   Lab Results  Component Value Date   CREATININE 1.14 10/19/2013   BUN 15 10/19/2013   NA 138 10/19/2013   K 3.9 10/19/2013   CL 103 10/19/2013   CO2 26 10/19/2013    Lab Results  Component Value Date   HGBA1C 7.4* 07/31/2013   Lipid Panel     Component Value Date/Time   CHOL 143 07/31/2013 2210   TRIG 168* 07/31/2013 2210   HDL 46 07/31/2013 2210   CHOLHDL 3.1 07/31/2013 2210   VLDL 34 07/31/2013 2210   LDLCALC 63 07/31/2013 2210       Assessment and plan:   Patient Active Problem List   Diagnosis Date Noted  . Atrial fibrillation 10/19/2013  . Diabetes mellitus, antepartum(648.03) 10/19/2013  . Diabetes 10/19/2013  . Anticoagulation goal of INR 2 to 3 10/19/2013  . Eczema 10/19/2013  . Low libido 10/16/2013  . Paroxysmal atrial fibrillation 10/06/2013  . Encounter for therapeutic drug monitoring 09/10/2013  . HTN (hypertension) 07/31/2013  . Encounter for long-term (current) use of anticoagulants  04/16/2012  . Coronary artery disease 12/28/2011  . Chronic combined systolic and diastolic CHF (congestive heart failure) 12/18/2011  . Rectal bleeding 12/18/2011  . CANDIDIASIS 09/27/2010  . HYPERCHOLESTEROLEMIA 07/11/2010  . DENTAL CARIES 06/30/2010  . GOUT 05/05/2009  . DIABETES MELLITUS, TYPE II 03/06/2009  . HEPATITIS B, CHRONIC 12/30/2006  . OSTEOCHONDROMA 12/30/2006  . MITRAL REGURGITATION 12/30/2006  . LIVER FUNCTION TESTS, ABNORMAL, HX OF 12/30/2006  . COLONIC POLYPS, HX OF 12/30/2006  . COLONOSCOPY, HX OF 05/12/2001   Hematuria No history of nephrolithiasis Differential could include cystitis, epididymitis, nephrolithiasis? We'll order renal ultrasound and bladder ultrasound Urology referral for possible cystoscopy Empiric ciprofloxacin Patient advised to go to the ED if he develops gross hematuria to avoid retention      Diabetes insulin-dependent Because of patient noncompliance with NovoLog and Lantus the patient has been advised to continue with Lantus and metformin only and discontinue NovoLog The patient takes Lantus once a week Will change dose to 45 units at bedtime Continue metformin A1c increased to 11.0 due to noncompliance    The patient was given clear instructions to go to ER or return to medical center if symptoms don't improve, worsen or new problems develop. The patient verbalized understanding. The patient was told to call to get any lab results if not heard anything in the next week.

## 2013-11-11 NOTE — Progress Notes (Signed)
HFU Blood in his urine and in his semen.  Pt states that he is feeling very weak.

## 2013-11-12 LAB — TSH: TSH: 0.553 u[IU]/mL (ref 0.350–4.500)

## 2013-11-13 ENCOUNTER — Ambulatory Visit (INDEPENDENT_AMBULATORY_CARE_PROVIDER_SITE_OTHER): Payer: Medicare Other | Admitting: Pharmacist

## 2013-11-13 ENCOUNTER — Ambulatory Visit (HOSPITAL_COMMUNITY)
Admission: RE | Admit: 2013-11-13 | Discharge: 2013-11-13 | Disposition: A | Discharge status: Home / Self Care | Payer: Medicare Other | Source: Ambulatory Visit | Attending: Internal Medicine | Admitting: Internal Medicine

## 2013-11-13 DIAGNOSIS — Z5181 Encounter for therapeutic drug level monitoring: Secondary | ICD-10-CM | POA: Diagnosis not present

## 2013-11-13 DIAGNOSIS — E119 Type 2 diabetes mellitus without complications: Secondary | ICD-10-CM

## 2013-11-13 DIAGNOSIS — Q619 Cystic kidney disease, unspecified: Secondary | ICD-10-CM | POA: Insufficient documentation

## 2013-11-13 DIAGNOSIS — I4891 Unspecified atrial fibrillation: Secondary | ICD-10-CM | POA: Diagnosis not present

## 2013-11-13 DIAGNOSIS — R310 Gross hematuria: Secondary | ICD-10-CM

## 2013-11-13 DIAGNOSIS — R319 Hematuria, unspecified: Secondary | ICD-10-CM | POA: Insufficient documentation

## 2013-11-13 DIAGNOSIS — Z7901 Long term (current) use of anticoagulants: Secondary | ICD-10-CM | POA: Diagnosis not present

## 2013-11-13 DIAGNOSIS — N281 Cyst of kidney, acquired: Secondary | ICD-10-CM | POA: Diagnosis not present

## 2013-11-13 LAB — POCT INR: INR: 2.3

## 2013-11-13 NOTE — Addendum Note (Signed)
Addended by: Allyson Sabal MD, Ascencion Dike on: 11/13/2013 02:25 PM   Modules accepted: Orders

## 2013-11-16 ENCOUNTER — Telehealth: Payer: Self-pay | Admitting: Emergency Medicine

## 2013-11-16 NOTE — Telephone Encounter (Signed)
Pt given U/S results with scheduled MRI appointment States he may be out of town for appt 11/25/13 @ 1245 pm. Pt given [facility] radiology number for rescheduling

## 2013-11-17 ENCOUNTER — Telehealth: Payer: Self-pay | Admitting: Internal Medicine

## 2013-11-17 NOTE — Telephone Encounter (Signed)
Pt. Spoke to [place] in regards to MRI appt. made at [facility].... Pt.  Has further questions in regards to test done on 11/13/13. Please call pt.

## 2013-11-18 ENCOUNTER — Other Ambulatory Visit (HOSPITAL_COMMUNITY): Payer: Self-pay | Admitting: Family Medicine

## 2013-11-19 ENCOUNTER — Telehealth: Payer: Self-pay | Admitting: Emergency Medicine

## 2013-11-19 NOTE — Telephone Encounter (Signed)
Left message f/u with pt questions

## 2013-11-25 ENCOUNTER — Other Ambulatory Visit: Payer: Self-pay | Admitting: Internal Medicine

## 2013-11-25 ENCOUNTER — Ambulatory Visit (HOSPITAL_COMMUNITY)
Admission: RE | Admit: 2013-11-25 | Discharge: 2013-11-25 | Disposition: A | Discharge status: Home / Self Care | Payer: Medicare Other | Source: Ambulatory Visit | Attending: Internal Medicine | Admitting: Internal Medicine

## 2013-11-25 DIAGNOSIS — N281 Cyst of kidney, acquired: Secondary | ICD-10-CM | POA: Insufficient documentation

## 2013-11-25 DIAGNOSIS — I5022 Chronic systolic (congestive) heart failure: Secondary | ICD-10-CM

## 2013-11-25 DIAGNOSIS — D1803 Hemangioma of intra-abdominal structures: Secondary | ICD-10-CM | POA: Insufficient documentation

## 2013-11-25 DIAGNOSIS — K8020 Calculus of gallbladder without cholecystitis without obstruction: Secondary | ICD-10-CM | POA: Insufficient documentation

## 2013-11-25 DIAGNOSIS — K7689 Other specified diseases of liver: Secondary | ICD-10-CM | POA: Insufficient documentation

## 2013-11-25 DIAGNOSIS — E119 Type 2 diabetes mellitus without complications: Secondary | ICD-10-CM

## 2013-11-25 DIAGNOSIS — R319 Hematuria, unspecified: Secondary | ICD-10-CM | POA: Diagnosis not present

## 2013-11-25 DIAGNOSIS — I4891 Unspecified atrial fibrillation: Secondary | ICD-10-CM

## 2013-11-25 DIAGNOSIS — R310 Gross hematuria: Secondary | ICD-10-CM

## 2013-11-25 MED ORDER — GADOBENATE DIMEGLUMINE 529 MG/ML IV SOLN
19.0000 mL | Freq: Once | INTRAVENOUS | Status: AC | PRN
  Administered 2013-11-25: 19 mL via INTRAVENOUS

## 2013-11-27 ENCOUNTER — Telehealth: Payer: Self-pay | Admitting: Emergency Medicine

## 2013-11-27 NOTE — Telephone Encounter (Signed)
Message copied by Ricci Barker on Fri Nov 27, 2013 12:37 PM ------      Message from: Allyson Sabal MD, Ascencion Dike      Created: Thu Nov 26, 2013  5:15 PM       Mildly complex cyst in the upper pole of the left kidney could explain hematuria. Patient needs to followup with urology ASAP, referral already provided ------

## 2013-11-27 NOTE — Telephone Encounter (Signed)
Pt given MRI results with number to [facility] to f/u with Kidney cyst

## 2013-11-30 ENCOUNTER — Ambulatory Visit: Payer: Medicare Other | Attending: Internal Medicine | Admitting: *Deleted

## 2013-11-30 ENCOUNTER — Ambulatory Visit: Payer: Medicare Other | Admitting: Internal Medicine

## 2013-11-30 DIAGNOSIS — F4321 Adjustment disorder with depressed mood: Secondary | ICD-10-CM | POA: Diagnosis not present

## 2013-11-30 NOTE — Progress Notes (Signed)
LCSW met with patient in order to work on challenges with grief and loss and family support.  Patient is a father of 59 and lost his wife about 6 months ago.  Patient states that he has a huge void in his life since his wife passed away and has been drinking more but the drinking has been social.  Patient states that he knows that his health is poor but does not feel a desire to make change at this time.  LCSW processed with patient his hopes and direction for his future and patient identified that it was all about his children.  LCSW processed the importance of managing his health to fulfill his goal of supporting his children and encouraged patient to fill the 'void' with improved self care for his family's future.  Patient will contact clinic if he desires follow up appointment, but desires for son to follow up for care and support.  Christene Lye MSW, LCSW Duration 60 minutes

## 2013-12-11 ENCOUNTER — Telehealth: Payer: Self-pay | Admitting: *Deleted

## 2013-12-11 NOTE — Telephone Encounter (Signed)
Patient calling in regards to MRI results. Patient notified of MRI results and informed patient that according to prior note he was supposed to follow up with [facility]. Patient states he missed his appointment and was rescheduled. Provided patient with [facility] phone number so he can confirm his next appointment. Alverda Skeans, RN

## 2013-12-15 DIAGNOSIS — R310 Gross hematuria: Secondary | ICD-10-CM | POA: Diagnosis not present

## 2013-12-15 DIAGNOSIS — R361 Hematospermia: Secondary | ICD-10-CM | POA: Diagnosis not present

## 2014-01-19 ENCOUNTER — Ambulatory Visit: Payer: Medicare Other | Admitting: Internal Medicine

## 2014-02-11 ENCOUNTER — Ambulatory Visit: Payer: Medicare Other | Admitting: Internal Medicine

## 2014-02-16 ENCOUNTER — Telehealth: Payer: Self-pay | Admitting: *Deleted

## 2014-02-16 ENCOUNTER — Other Ambulatory Visit: Payer: Self-pay | Admitting: *Deleted

## 2014-02-16 MED ORDER — HYDRALAZINE HCL 50 MG PO TABS: 50.0000 mg | ORAL_TABLET | Freq: Three times a day (TID) | ORAL | Status: DC

## 2014-02-16 NOTE — Telephone Encounter (Signed)
Patient left a message wanting refill on Hydralazine. Prescription already refilled by [facility]. Called to inform patient. No answer and unable to leave a message. Vivia Birmingham, RN

## 2014-02-19 ENCOUNTER — Other Ambulatory Visit (INDEPENDENT_AMBULATORY_CARE_PROVIDER_SITE_OTHER): Payer: Medicare Other

## 2014-02-19 ENCOUNTER — Ambulatory Visit (INDEPENDENT_AMBULATORY_CARE_PROVIDER_SITE_OTHER): Payer: Medicare Other | Admitting: *Deleted

## 2014-02-19 DIAGNOSIS — I5042 Chronic combined systolic (congestive) and diastolic (congestive) heart failure: Secondary | ICD-10-CM | POA: Diagnosis not present

## 2014-02-19 DIAGNOSIS — Z5181 Encounter for therapeutic drug level monitoring: Secondary | ICD-10-CM

## 2014-02-19 DIAGNOSIS — Z7901 Long term (current) use of anticoagulants: Secondary | ICD-10-CM | POA: Diagnosis not present

## 2014-02-19 DIAGNOSIS — I509 Heart failure, unspecified: Secondary | ICD-10-CM

## 2014-02-19 DIAGNOSIS — I4891 Unspecified atrial fibrillation: Secondary | ICD-10-CM | POA: Diagnosis not present

## 2014-02-19 LAB — BASIC METABOLIC PANEL
BUN: 13 mg/dL (ref 6–23)
CHLORIDE: 99 meq/L (ref 96–112)
CO2: 25 mEq/L (ref 19–32)
Calcium: 9 mg/dL (ref 8.4–10.5)
Creatinine, Ser: 1.2 mg/dL (ref 0.4–1.5)
GFR: 77.22 mL/min (ref >60.00)
Glucose, Bld: 211 mg/dL — ABNORMAL HIGH (ref 70–99)
POTASSIUM: 3.3 meq/L — AB (ref 3.5–5.1)
SODIUM: 134 meq/L — AB (ref 135–145)

## 2014-02-19 LAB — POCT INR: INR: 1.5

## 2014-02-19 MED ORDER — WARFARIN SODIUM 5 MG PO TABS: 5.0000 mg | ORAL_TABLET | ORAL | Status: DC

## 2014-02-22 ENCOUNTER — Telehealth: Payer: Self-pay | Admitting: *Deleted

## 2014-02-23 NOTE — Telephone Encounter (Signed)
No answer

## 2014-02-26 ENCOUNTER — Telehealth: Payer: Self-pay | Admitting: *Deleted

## 2014-02-26 ENCOUNTER — Encounter: Payer: Self-pay | Admitting: *Deleted

## 2014-02-26 NOTE — Telephone Encounter (Signed)
lmptcb x 5 see previous phone note, I will send out letter today for ptcb to go over lab results per Melina Copa, PA.

## 2014-03-02 ENCOUNTER — Telehealth: Payer: Self-pay | Admitting: *Deleted

## 2014-03-02 DIAGNOSIS — I5042 Chronic combined systolic (congestive) and diastolic (congestive) heart failure: Secondary | ICD-10-CM

## 2014-03-02 DIAGNOSIS — I10 Essential (primary) hypertension: Secondary | ICD-10-CM

## 2014-03-02 NOTE — Telephone Encounter (Signed)
cannot lmom. Pt is coming in 7/24 for CVRR appt, per Melina Copa, PA pt needs repeat  bmet, see previous phone notes. I scheduled lab appt 7/24 same day as CVRR appt.

## 2014-03-05 ENCOUNTER — Ambulatory Visit (INDEPENDENT_AMBULATORY_CARE_PROVIDER_SITE_OTHER): Payer: Medicare Other | Admitting: Pharmacist

## 2014-03-05 ENCOUNTER — Other Ambulatory Visit: Payer: Medicare Other

## 2014-03-05 ENCOUNTER — Other Ambulatory Visit (INDEPENDENT_AMBULATORY_CARE_PROVIDER_SITE_OTHER): Payer: Medicare Other

## 2014-03-05 DIAGNOSIS — I509 Heart failure, unspecified: Secondary | ICD-10-CM | POA: Diagnosis not present

## 2014-03-05 DIAGNOSIS — I10 Essential (primary) hypertension: Secondary | ICD-10-CM

## 2014-03-05 DIAGNOSIS — I4891 Unspecified atrial fibrillation: Secondary | ICD-10-CM | POA: Diagnosis not present

## 2014-03-05 DIAGNOSIS — I5042 Chronic combined systolic (congestive) and diastolic (congestive) heart failure: Secondary | ICD-10-CM | POA: Diagnosis not present

## 2014-03-05 DIAGNOSIS — Z5181 Encounter for therapeutic drug level monitoring: Secondary | ICD-10-CM

## 2014-03-05 DIAGNOSIS — Z7901 Long term (current) use of anticoagulants: Secondary | ICD-10-CM

## 2014-03-05 LAB — BASIC METABOLIC PANEL
BUN: 10 mg/dL (ref 6–23)
CHLORIDE: 105 meq/L (ref 96–112)
CO2: 26 meq/L (ref 19–32)
Calcium: 9.3 mg/dL (ref 8.4–10.5)
Creatinine, Ser: 1 mg/dL (ref 0.4–1.5)
GFR: 100.36 mL/min (ref >60.00)
Glucose, Bld: 173 mg/dL — ABNORMAL HIGH (ref 70–99)
POTASSIUM: 3.3 meq/L — AB (ref 3.5–5.1)
SODIUM: 137 meq/L (ref 135–145)

## 2014-03-05 LAB — POCT INR: INR: 2.3

## 2014-03-10 ENCOUNTER — Telehealth: Payer: Self-pay | Admitting: *Deleted

## 2014-03-10 NOTE — Telephone Encounter (Signed)
lmptcb for lab results and to go over lab results and to verify meds per Melina Copa, PA. I have left several messages with no return call from pt. Pt does have appt w/Scott W. PA 7/30 10:10.

## 2014-03-11 ENCOUNTER — Ambulatory Visit: Payer: Medicare Other | Admitting: Physician Assistant

## 2014-03-11 DIAGNOSIS — I428 Other cardiomyopathies: Secondary | ICD-10-CM | POA: Insufficient documentation

## 2014-03-11 NOTE — Progress Notes (Signed)
Cardiology Office Note    Date:  03/12/2014   ID:  Mikal Blasdell, DOB 09/22/1955, MRN [id number]  PCP:  Angelica Chessman, MD  Cardiologist:  Dr. Loralie Champagne      History of Present Illness: Treyshaun Keatts is a 58 y.o. male with a hx of HTN, DM, PAF, CAD, CKD.  LHC in 5/13 demonstrated severe distal and branch vessel disease.  There was no target for PCI.  Disease was felt to be responsible for CP and med Rx was continued.  He had improvement with Ranolazine in the past.  Echo in 5/13 demonstrated normal LVF.  Admitted in 5/14 with hypertensive urgency.  He ran out of medications.  Last seen in the office by D. Dunn, PA-C after a trip to the ED with CP and he was found to be in AFib with RVR.  His INR was low but he admitted to compliance with coumadin. Diltiazem 120 QD was added for rate control.  He was back in NSR in the office.  He was volume overloaded and his Lasix was increased.  He was to f/u with [place] 2 weeks later, but this is his first visit back.  He was on my schedule yesterday but showed up several hours late.  He was rescheduled for today.    He is doing well.  He has chronic SOB.  He is NYHA 2-2b.  Denies orthopnea, PND, edema.  He has occasional atypical chest pain that is brief.  Denies exertional symptoms.  No syncope.  Has run out of some medications. Not taking Diltiazem. Out for 3-4 weeks.  K+ has been low.  Patient has not been taking K+.     Studies:  - LHC (5/13):  LHC (5/13) with diffuse distal and branch vessel disease, mild global hypokinesis and EF 45%  - Echo (5/14):  inferbasal HK, mild LVH, EF 50-55%, trivial AI, mild MR, mild LAE  - Nuclear (5/14):  Inf defect c/w thinning vs infarct, no ischemia, EF 31%; High Risk  - cMRI (5/14): Mild LVH, EF 44%, normal RV size and function, small areas of subepicardial delayed enhancement of the mid anteroseptal and mid inferoseptal RV insertion sites-nonspecific finding   Recent Labs/Images: 10/05/2013: ALT 13; Pro B  Natriuretic peptide (BNP) 932.3*  11/08/2013: Hemoglobin 15.0  11/11/2013: HDL Cholesterol by NMR 46; LDL (calc) 54; TSH 0.553  03/05/2014: Creatinine 1.0; Potassium 3.3*   Mr Abdomen W Wo Contrast  11/25/2013   .  IMPRESSION: 1. Mildly complex cyst in the upper pole of the left kidney (Bosniak class 2). 2. Mild hepatic steatosis. 3. Small cavernous hemangioma in segment 3 of the liver incidentally noted. 4. Cholelithiasis without evidence to suggest acute cholecystitis at this time.   Electronically Signed   By: Vinnie Langton M.D.   On: 11/25/2013 16:11     Wt Readings from Last 3 Encounters:  03/12/14 212 lb (96.163 kg)  11/11/13 214 lb (97.07 kg)  10/19/13 218 lb (98.884 kg)     Past Medical History  Diagnosis Date  . Hypertension   . Gout   . PAF (paroxysmal atrial fibrillation)     On coumadin  . Diabetes mellitus   . LIVER FUNCTION TESTS, ABNORMAL, HX OF 12/30/2006  . Rectal bleeding 12/18/2011    Scheduled for colonoscopy.    [name] HEPATITIS B, CHRONIC 12/30/2006  . OSTEOCHONDROMA 12/30/2006     LEFT SHOULDER   . HYPERCHOLESTEROLEMIA 07/11/2010  . MITRAL REGURGITATION 12/30/2006  . COLONIC POLYPS, HX OF  12/30/2006  . CAD (coronary artery disease), native coronary artery     a. Nonobstructive CAD by cath 2013 - diffuse distal and branch vessel CAD, no severe disease in the major coronaries, LV mild global hypokinesis, EF 45%. b. ETT-Sestamibi 5/14: EF 31%, small fixed inferior defect with no ischemia.  . Chronic CHF     a. Mixed ICM/NICM (?EtOH). EF 35% in 2008. Echo 5/13: EF 60-65%, mod LVH, EF 45% on V gram in 12/2011. EF 12/2012: EF 50-55%, mild LVH, inferobasal HK, mild MR. ETT-Ses 5/14 EF 41%. Cardiac MRI 5/14: EF 44%, mild global HK, subepicardial delayed enhancement in nonspecific RV insertion pattern.  . H/O atrial flutter 2007    a. Ablations in 2007, 2008.  [name] History of medication noncompliance   . History of alcohol abuse     Current Outpatient Prescriptions  Medication  Sig Dispense Refill  . amLODipine (NORVASC) 5 MG tablet Take 5 mg by mouth daily.      [name] atorvastatin (LIPITOR) 40 MG tablet Take 40 mg by mouth daily.       . carvedilol (COREG) 25 MG tablet Take 25 mg by mouth 2 (two) times daily with a meal.      . furosemide (LASIX) 40 MG tablet Take 40 mg by mouth daily.      . hydrALAZINE (APRESOLINE) 50 MG tablet Take 1 tablet (50 mg total) by mouth every 8 (eight) hours.  90 tablet  0  . insulin aspart (NOVOLOG) 100 UNIT/ML FlexPen Inject 7 Units into the skin 3 (three) times daily with meals.  15 mL  0  . insulin glargine (LANTUS) 100 UNIT/ML injection Inject 0.45 mLs (45 Units total) into the skin at bedtime.  10 mL  11  . Iron TABS Take 1 tablet by mouth daily.      . isosorbide mononitrate (IMDUR) 60 MG 24 hr tablet Take 60 mg by mouth daily.       [name] losartan (COZAAR) 100 MG tablet Take 100 mg by mouth daily.      . metFORMIN (GLUCOPHAGE) 1000 MG tablet Take 1 tablet (1,000 mg total) by mouth 2 (two) times daily with a meal.  180 tablet  3  . Potassium Chloride ER 20 MEQ TBCR Take 20 mEq by mouth daily.  30 tablet  11  . warfarin (COUMADIN) 5 MG tablet Take 1 tablet (5 mg total) by mouth as directed.  40 tablet  1  . diltiazem (CARDIZEM CD) 120 MG 24 hr capsule Take 1 capsule (120 mg total) by mouth daily.       No current facility-administered medications for this visit.     Allergies:   Ace inhibitors and Other   Social History:  The patient  reports that he quit smoking about 30 years ago. He has never used smokeless tobacco. He reports that he drinks alcohol. He reports that he uses illicit drugs (Marijuana).   Family History:  The patient's family history includes Diabetes in his mother; Hypertension in his mother.   ROS:  Please see the history of present illness.      All other systems reviewed and negative.   PHYSICAL EXAM: VS:  BP 150/100  Pulse 77  Ht 6' (1.829 m)  Wt 212 lb (96.163 kg)  BMI 28.75 kg/m2 Well nourished, well  developed, in no acute distress HEENT: normal Neck: no JVD Cardiac:  normal S1, S2; RRR; no murmur Lungs:  clear to auscultation bilaterally, no wheezing, rhonchi or rales Abd: soft,  nontender, no hepatomegaly Ext: no edema Skin: warm and dry Neuro:  CNs 2-12 intact, no focal abnormalities noted  EKG:  NSR, HR 77, normal axis, LVH with repol, no changes     ASSESSMENT AND PLAN:  Chronic combined systolic and diastolic CHF (congestive heart failure):  Volume stable.  Continue current Rx.  Resume K+.  Check BMET in 1 week.   NICM (nonischemic cardiomyopathy):  Continue beta blocker, ARB, nitrates, hydralazine.  Not sure he is a good candidate for Spironolactone (compliance issues).  If K+ remains low, could consider this.  Coronary artery disease:  He has occasional chest pain.  No exertional angina symptoms.  Continue beta blocker, nitrates.  He is not on ASA as he is on coumadin.  Essential hypertension:  Uncontrolled.  No meds yet today.  He has been out of Imdur as well.  Resume meds.  F/u 1 month.  If high, increase Amlodipine.    Paroxysmal atrial fibrillation:  Maintaining NSR.  Continue coumadin.  With reduced LVF, not sure Diltiazem a good choice.  He has not taken in weeks.  Will keep him off of this.    HYPERCHOLESTEROLEMIA:  Has been out of Lipitor.  He will refill.   Hypokalemia:  Resume K+.  Check BMET in 1 week.   Disposition:  F/u with me 1 month.   Signed, Versie Starks, MHS 03/12/2014 9:41 AM    [facility] [place], [place], [place]  [address] Phone: [phone]; Fax: [phone]

## 2014-03-12 ENCOUNTER — Ambulatory Visit (INDEPENDENT_AMBULATORY_CARE_PROVIDER_SITE_OTHER): Payer: Medicare Other | Admitting: Physician Assistant

## 2014-03-12 ENCOUNTER — Ambulatory Visit: Payer: Medicare Other | Admitting: Physician Assistant

## 2014-03-12 ENCOUNTER — Encounter: Payer: Self-pay | Admitting: Physician Assistant

## 2014-03-12 VITALS — BP 150/100 | HR 77 | Ht 72.0 in | Wt 212.0 lb

## 2014-03-12 DIAGNOSIS — I428 Other cardiomyopathies: Secondary | ICD-10-CM | POA: Diagnosis not present

## 2014-03-12 DIAGNOSIS — I10 Essential (primary) hypertension: Secondary | ICD-10-CM | POA: Diagnosis not present

## 2014-03-12 DIAGNOSIS — I4891 Unspecified atrial fibrillation: Secondary | ICD-10-CM

## 2014-03-12 DIAGNOSIS — E876 Hypokalemia: Secondary | ICD-10-CM

## 2014-03-12 DIAGNOSIS — I509 Heart failure, unspecified: Secondary | ICD-10-CM | POA: Diagnosis not present

## 2014-03-12 DIAGNOSIS — I5042 Chronic combined systolic (congestive) and diastolic (congestive) heart failure: Secondary | ICD-10-CM | POA: Diagnosis not present

## 2014-03-12 DIAGNOSIS — I2510 Atherosclerotic heart disease of native coronary artery without angina pectoris: Secondary | ICD-10-CM | POA: Diagnosis not present

## 2014-03-12 DIAGNOSIS — I480 Paroxysmal atrial fibrillation: Secondary | ICD-10-CM

## 2014-03-12 DIAGNOSIS — E7800 Pure hypercholesterolemia, unspecified: Secondary | ICD-10-CM

## 2014-03-12 NOTE — Patient Instructions (Addendum)
STOP DITLIAZEM  START POTASSIUM 20 MEQ DAILY  LAB WORK IN 1 WEEK 03/19/14 BMET

## 2014-03-19 ENCOUNTER — Other Ambulatory Visit: Payer: Medicare Other

## 2014-03-26 ENCOUNTER — Ambulatory Visit (INDEPENDENT_AMBULATORY_CARE_PROVIDER_SITE_OTHER): Payer: Medicare Other | Admitting: Pharmacist

## 2014-03-26 DIAGNOSIS — Z7901 Long term (current) use of anticoagulants: Secondary | ICD-10-CM

## 2014-03-26 DIAGNOSIS — I4891 Unspecified atrial fibrillation: Secondary | ICD-10-CM

## 2014-03-26 DIAGNOSIS — Z5181 Encounter for therapeutic drug level monitoring: Secondary | ICD-10-CM | POA: Diagnosis not present

## 2014-03-26 LAB — POCT INR: INR: 1.6

## 2014-04-12 ENCOUNTER — Ambulatory Visit (INDEPENDENT_AMBULATORY_CARE_PROVIDER_SITE_OTHER): Payer: Medicare Other | Admitting: Pharmacist

## 2014-04-12 ENCOUNTER — Ambulatory Visit (INDEPENDENT_AMBULATORY_CARE_PROVIDER_SITE_OTHER): Payer: Medicare Other | Admitting: Physician Assistant

## 2014-04-12 ENCOUNTER — Encounter: Payer: Self-pay | Admitting: Physician Assistant

## 2014-04-12 VITALS — BP 130/100 | HR 79 | Ht 72.0 in | Wt 209.0 lb

## 2014-04-12 DIAGNOSIS — I2510 Atherosclerotic heart disease of native coronary artery without angina pectoris: Secondary | ICD-10-CM | POA: Diagnosis not present

## 2014-04-12 DIAGNOSIS — I480 Paroxysmal atrial fibrillation: Secondary | ICD-10-CM

## 2014-04-12 DIAGNOSIS — Z7901 Long term (current) use of anticoagulants: Secondary | ICD-10-CM | POA: Diagnosis not present

## 2014-04-12 DIAGNOSIS — I10 Essential (primary) hypertension: Secondary | ICD-10-CM | POA: Diagnosis not present

## 2014-04-12 DIAGNOSIS — R0602 Shortness of breath: Secondary | ICD-10-CM

## 2014-04-12 DIAGNOSIS — I428 Other cardiomyopathies: Secondary | ICD-10-CM | POA: Diagnosis not present

## 2014-04-12 DIAGNOSIS — Z5181 Encounter for therapeutic drug level monitoring: Secondary | ICD-10-CM

## 2014-04-12 DIAGNOSIS — I5042 Chronic combined systolic (congestive) and diastolic (congestive) heart failure: Secondary | ICD-10-CM | POA: Diagnosis not present

## 2014-04-12 DIAGNOSIS — I4891 Unspecified atrial fibrillation: Secondary | ICD-10-CM

## 2014-04-12 DIAGNOSIS — I509 Heart failure, unspecified: Secondary | ICD-10-CM

## 2014-04-12 DIAGNOSIS — E7800 Pure hypercholesterolemia, unspecified: Secondary | ICD-10-CM

## 2014-04-12 LAB — BASIC METABOLIC PANEL
BUN: 13 mg/dL (ref 6–23)
CO2: 27 meq/L (ref 19–32)
Calcium: 9.2 mg/dL (ref 8.4–10.5)
Chloride: 104 mEq/L (ref 96–112)
Creatinine, Ser: 1 mg/dL (ref 0.4–1.5)
GFR: 93.68 mL/min (ref >60.00)
Glucose, Bld: 156 mg/dL — ABNORMAL HIGH (ref 70–99)
POTASSIUM: 3.5 meq/L (ref 3.5–5.1)
SODIUM: 140 meq/L (ref 135–145)

## 2014-04-12 LAB — POCT INR: INR: 4

## 2014-04-12 LAB — BRAIN NATRIURETIC PEPTIDE: Pro B Natriuretic peptide (BNP): 75 pg/mL (ref 0.0–100.0)

## 2014-04-12 MED ORDER — AMLODIPINE BESYLATE 10 MG PO TABS: 10.0000 mg | ORAL_TABLET | Freq: Every day | ORAL | Status: DC

## 2014-04-12 NOTE — Progress Notes (Signed)
Cardiology Office Note    Date:  04/12/2014   ID:  [name] [name], DOB Aug 24, 1955, MRN [id number]  PCP:  [name] Chessman, MD  Cardiologist:  Dr. Loralie Champagne      History of Present Illness: [name] [name] is a 58 y.o. male with a hx of HTN, DM, PAF, CAD, CKD.  LHC in 5/13 demonstrated severe distal and branch vessel disease.  There was no target for PCI.  Disease was felt to be responsible for CP and med Rx was continued.  He had improvement with Ranolazine in the past.  Echo in 5/13 demonstrated normal LVF.  Admitted in 5/14 with hypertensive urgency.  He ran out of medications.  Seen in the office by D. Dunn, PA-C 10/2013 after a trip to the ED with CP and he was found to be in AFib with RVR.   Diltiazem 120 QD was added for rate control.  He was back in NSR in the office.  I saw him 02/2014. Blood pressure was uncontrolled. Medications were resumed.  He returns for FU.  He has not taken any medications yet today.  He admits to increased ETOH intake over the last few days.  He denies anginal chest pain.  Does note some vague discomfort in his R chest.  Denies exertional chest pain.  He has chronic DOE.  He is NYHA 2b.  Denies any changes.  Denies orthopnea,, PND, edema.  No syncope.     Studies:  - LHC (5/13):  LHC (5/13) with diffuse distal and branch vessel disease, mild global hypokinesis and EF 45%  - Echo (5/14):  inferbasal HK, mild LVH, EF 50-55%, trivial AI, mild MR, mild LAE  - Nuclear (5/14):  Inf defect c/w thinning vs infarct, no ischemia, EF 31%; High Risk  - cMRI (5/14): Mild LVH, EF 44%, normal RV size and function, small areas of subepicardial delayed enhancement of the mid anteroseptal and mid inferoseptal RV insertion sites-nonspecific finding   Recent Labs/Images: 10/05/2013: ALT 13; Pro B Natriuretic peptide (BNP) 932.3*  11/08/2013: Hemoglobin 15.0  11/11/2013: HDL Cholesterol by NMR 46; LDL (calc) 54; TSH 0.553  03/05/2014: Creatinine 1.0; Potassium  3.3*    Wt Readings from Last 3 Encounters:  04/12/14 209 lb (94.802 kg)  03/12/14 212 lb (96.163 kg)  11/11/13 214 lb (97.07 kg)     Past Medical History  Diagnosis Date  . Hypertension   . Gout   . PAF (paroxysmal atrial fibrillation)     On coumadin  . Diabetes mellitus   . LIVER FUNCTION TESTS, ABNORMAL, HX OF 12/30/2006  . Rectal bleeding 12/18/2011    Scheduled for colonoscopy.    [name] [name] HEPATITIS B, CHRONIC 12/30/2006  . OSTEOCHONDROMA 12/30/2006     LEFT SHOULDER   . HYPERCHOLESTEROLEMIA 07/11/2010  . MITRAL REGURGITATION 12/30/2006  . COLONIC POLYPS, HX OF 12/30/2006  . CAD (coronary artery disease), native coronary artery     a. Nonobstructive CAD by cath 2013 - diffuse distal and branch vessel CAD, no severe disease in the major coronaries, LV mild global hypokinesis, EF 45%. b. ETT-Sestamibi 5/14: EF 31%, small fixed inferior defect with no ischemia.  . Chronic CHF     a. Mixed ICM/NICM (?EtOH). EF 35% in 2008. Echo 5/13: EF 60-65%, mod LVH, EF 45% on V gram in 12/2011. EF 12/2012: EF 50-55%, mild LVH, inferobasal HK, mild MR. ETT-Ses 5/14 EF 41%. Cardiac MRI 5/14: EF 44%, mild global HK, subepicardial delayed enhancement in nonspecific RV insertion pattern.  [name] [name]  H/O atrial flutter 2007    a. Ablations in 2007, 2008.  [name] [name] History of medication noncompliance   . History of alcohol abuse     Current Outpatient Prescriptions  Medication Sig Dispense Refill  . amLODipine (NORVASC) 5 MG tablet Take 5 mg by mouth daily.      [name] [name] atorvastatin (LIPITOR) 40 MG tablet Take 40 mg by mouth daily.       . carvedilol (COREG) 25 MG tablet Take 25 mg by mouth 2 (two) times daily with a meal.      . furosemide (LASIX) 40 MG tablet Take 40 mg by mouth daily.      . hydrALAZINE (APRESOLINE) 50 MG tablet Take 1 tablet (50 mg total) by mouth every 8 (eight) hours.  90 tablet  0  . insulin aspart (NOVOLOG) 100 UNIT/ML FlexPen Inject 7 Units into the skin 3 (three) times daily with meals.  15 mL  0  .  insulin glargine (LANTUS) 100 UNIT/ML injection Inject 0.45 mLs (45 Units total) into the skin at bedtime.  10 mL  11  . isosorbide mononitrate (IMDUR) 60 MG 24 hr tablet Take 60 mg by mouth daily.       [name] [name] losartan (COZAAR) 100 MG tablet Take 100 mg by mouth daily.      . metFORMIN (GLUCOPHAGE) 1000 MG tablet Take 1 tablet (1,000 mg total) by mouth 2 (two) times daily with a meal.  180 tablet  3  . Potassium Chloride ER 20 MEQ TBCR Take 20 mEq by mouth daily.  30 tablet  11  . warfarin (COUMADIN) 5 MG tablet Take 1 tablet (5 mg total) by mouth as directed.  40 tablet  1   No current facility-administered medications for this visit.     Allergies:   Ace inhibitors and Other   Social History:  The patient  reports that he quit smoking about 30 years ago. He has never used smokeless tobacco. He reports that he drinks alcohol. He reports that he uses illicit drugs (Marijuana).   Family History:  The patient's family history includes Diabetes in his mother; Hypertension in his mother. There is no history of Heart attack.   ROS:  Please see the history of present illness.   No bleeding.   All other systems reviewed and negative.   PHYSICAL EXAM: VS:  BP 130/100  Pulse 79  Ht 6' (1.829 m)  Wt 209 lb (94.802 kg)  BMI 28.34 kg/m2 Well nourished, well developed, in no acute distress HEENT: normal Neck: no JVD Cardiac:  normal S1, S2; RRR; no murmur Lungs:  clear to auscultation bilaterally, no wheezing, rhonchi or rales Abd: soft, nontender, no hepatomegaly Ext: no edema Skin: warm and dry Neuro:  CNs 2-12 intact, no focal abnormalities noted  EKG:  NSR, HR 79, normal axis, LVH with repol abnl, no changes    ASSESSMENT AND PLAN:  Chronic combined systolic and diastolic CHF (congestive heart failure):  Volume appears stable.  He has chronic DOE.  Will check BMET and BNP today.  If BNP significant elevated, consider increasing Lasix.    NICM (nonischemic cardiomyopathy):  Continue beta  blocker, ARB, nitrates, hydralazine.    Coronary artery disease:  No angina.  Continue beta blocker, nitrates.  He is not on ASA as he is on coumadin.  Essential hypertension:  Uncontrolled.  Increase Amlodipine to 10 mg QD.  I have asked him to cut back on his ETOH intake.   Paroxysmal atrial fibrillation: Maintaining NSR.  Continue coumadin.    HYPERCHOLESTEROLEMIA:  Continue statin.   Disposition:   FU with me in 6 weeks and Dr. Loralie Champagne in 3-4 mos.   Signed, Versie Starks, MHS 04/12/2014 9:46 AM    [facility] [place], [place], [place]  [address] Phone: [phone]; Fax: [phone]

## 2014-04-12 NOTE — Patient Instructions (Signed)
LAB WORK TODAY; BMET, BNP  INCREASE AMLODIPINE 10 MG DAILY; NEW RX WAS SENT IN TODAY  Your physician recommends that you schedule a follow-up appointment in: [place], [place] ; MAKE SURE TO TAKE YOUR MEDICATIONS 1-2 HOURS BEFORE YOUR APPT  Your physician recommends that you schedule a follow-up appointment in: 3-4 MONTHS WITH DR. Aundra Dubin; MAKE SURE TO TAKE YOUR MEDICATIONS 1-2 HOURS BEFORE YOUR APPT

## 2014-04-13 ENCOUNTER — Encounter: Payer: Self-pay | Admitting: *Deleted

## 2014-04-13 ENCOUNTER — Telehealth: Payer: Self-pay | Admitting: *Deleted

## 2014-04-13 DIAGNOSIS — I10 Essential (primary) hypertension: Secondary | ICD-10-CM

## 2014-04-13 DIAGNOSIS — I5042 Chronic combined systolic (congestive) and diastolic (congestive) heart failure: Secondary | ICD-10-CM

## 2014-04-13 MED ORDER — POTASSIUM CHLORIDE ER 20 MEQ PO TBCR: 30.0000 meq | EXTENDED_RELEASE_TABLET | Freq: Every day | ORAL | Status: DC

## 2014-04-13 NOTE — Progress Notes (Unsigned)
Fax received from [facility] for Doctor Order Form for diabetic supplies. Form placed in Dr. Christa See box for signature. Vivia Birmingham, RN

## 2014-04-13 NOTE — Telephone Encounter (Signed)
pt notified about lab results and to increase K+ to 30 meq daily. Pt adsvised need bmet 1 wk, he asked if bmet could be done 9/14 when he comes in for CVRR appt. I advised I will d/w Brynda Rim. PA and cb to let pt know.

## 2014-04-20 ENCOUNTER — Other Ambulatory Visit: Payer: Self-pay | Admitting: Cardiology

## 2014-05-09 ENCOUNTER — Other Ambulatory Visit: Payer: Self-pay | Admitting: Cardiology

## 2014-05-18 ENCOUNTER — Ambulatory Visit: Payer: Medicare Other | Attending: Internal Medicine | Admitting: Internal Medicine

## 2014-05-18 ENCOUNTER — Encounter: Payer: Self-pay | Admitting: Internal Medicine

## 2014-05-18 VITALS — BP 162/85 | HR 73 | Temp 98.0°F | Resp 16 | Ht 72.0 in | Wt 213.0 lb

## 2014-05-18 DIAGNOSIS — I480 Paroxysmal atrial fibrillation: Secondary | ICD-10-CM | POA: Insufficient documentation

## 2014-05-18 DIAGNOSIS — E785 Hyperlipidemia, unspecified: Secondary | ICD-10-CM

## 2014-05-18 DIAGNOSIS — B1910 Unspecified viral hepatitis B without hepatic coma: Secondary | ICD-10-CM | POA: Diagnosis not present

## 2014-05-18 DIAGNOSIS — I10 Essential (primary) hypertension: Secondary | ICD-10-CM | POA: Insufficient documentation

## 2014-05-18 DIAGNOSIS — I2510 Atherosclerotic heart disease of native coronary artery without angina pectoris: Secondary | ICD-10-CM | POA: Diagnosis not present

## 2014-05-18 DIAGNOSIS — Z794 Long term (current) use of insulin: Secondary | ICD-10-CM | POA: Insufficient documentation

## 2014-05-18 DIAGNOSIS — Z7901 Long term (current) use of anticoagulants: Secondary | ICD-10-CM | POA: Diagnosis not present

## 2014-05-18 DIAGNOSIS — I5042 Chronic combined systolic (congestive) and diastolic (congestive) heart failure: Secondary | ICD-10-CM | POA: Diagnosis not present

## 2014-05-18 DIAGNOSIS — E119 Type 2 diabetes mellitus without complications: Secondary | ICD-10-CM | POA: Insufficient documentation

## 2014-05-18 LAB — POCT URINALYSIS DIPSTICK
BILIRUBIN UA: NEGATIVE
Blood, UA: NEGATIVE
Glucose, UA: 100
Ketones, UA: NEGATIVE
LEUKOCYTES UA: NEGATIVE
NITRITE UA: NEGATIVE
PH UA: 6
Protein, UA: 30
Spec Grav, UA: 1.015
UROBILINOGEN UA: 1

## 2014-05-18 LAB — GLUCOSE, POCT (MANUAL RESULT ENTRY): POC GLUCOSE: 191 mg/dL — AB (ref 70–99)

## 2014-05-18 LAB — POCT GLYCOSYLATED HEMOGLOBIN (HGB A1C): HEMOGLOBIN A1C: 7.3

## 2014-05-18 MED ORDER — METFORMIN HCL 1000 MG PO TABS: 1000.0000 mg | ORAL_TABLET | Freq: Two times a day (BID) | ORAL | Status: DC

## 2014-05-18 MED ORDER — FUROSEMIDE 40 MG PO TABS: 40.0000 mg | ORAL_TABLET | Freq: Every day | ORAL | Status: DC

## 2014-05-18 MED ORDER — LOSARTAN POTASSIUM 100 MG PO TABS: 100.0000 mg | ORAL_TABLET | Freq: Every day | ORAL | Status: DC

## 2014-05-18 MED ORDER — AMLODIPINE BESYLATE 10 MG PO TABS: 10.0000 mg | ORAL_TABLET | Freq: Every day | ORAL | Status: DC

## 2014-05-18 MED ORDER — INSULIN GLARGINE 100 UNIT/ML ~~LOC~~ SOLN: 60.0000 [IU] | Freq: Every day | SUBCUTANEOUS | Status: DC

## 2014-05-18 MED ORDER — ATORVASTATIN CALCIUM 40 MG PO TABS: 40.0000 mg | ORAL_TABLET | Freq: Every day | ORAL | Status: DC

## 2014-05-18 MED ORDER — INSULIN ASPART 100 UNIT/ML FLEXPEN: 7.0000 [IU] | PEN_INJECTOR | Freq: Three times a day (TID) | SUBCUTANEOUS | Status: DC

## 2014-05-18 MED ORDER — WARFARIN SODIUM 5 MG PO TABS: 5.0000 mg | ORAL_TABLET | ORAL | Status: DC

## 2014-05-18 MED ORDER — CARVEDILOL 25 MG PO TABS: 25.0000 mg | ORAL_TABLET | Freq: Two times a day (BID) | ORAL | Status: DC

## 2014-05-18 MED ORDER — ISOSORBIDE MONONITRATE ER 60 MG PO TB24: 60.0000 mg | ORAL_TABLET | Freq: Every day | ORAL | Status: DC

## 2014-05-18 MED ORDER — POTASSIUM CHLORIDE ER 20 MEQ PO TBCR: 30.0000 meq | EXTENDED_RELEASE_TABLET | Freq: Every day | ORAL | Status: DC

## 2014-05-18 MED ORDER — HYDRALAZINE HCL 50 MG PO TABS: 50.0000 mg | ORAL_TABLET | Freq: Three times a day (TID) | ORAL | Status: DC

## 2014-05-18 NOTE — Progress Notes (Signed)
Pt is here following up on his HTN and diabetes. Pt reports having for 2 weeks off and on SOB, right flank pain, low back pain and he is waking up with headaches. Pt states that he quit taking his insulin b/c he thinks that the pain might be coming from these meds.

## 2014-05-18 NOTE — Patient Instructions (Signed)
Diabetes Mellitus and Food It is important for you to manage your blood sugar (glucose) level. Your blood glucose level can be greatly affected by what you eat. Eating healthier foods in the appropriate amounts throughout the day at about the same time each day will help you control your blood glucose level. It can also help slow or prevent worsening of your diabetes mellitus. Healthy eating may even help you improve the level of your blood pressure and reach or maintain a healthy weight.  HOW CAN FOOD AFFECT ME? Carbohydrates Carbohydrates affect your blood glucose level more than any other type of food. Your dietitian will help you determine how many carbohydrates to eat at each meal and teach you how to count carbohydrates. Counting carbohydrates is important to keep your blood glucose at a healthy level, especially if you are using insulin or taking certain medicines for diabetes mellitus. Alcohol Alcohol can cause sudden decreases in blood glucose (hypoglycemia), especially if you use insulin or take certain medicines for diabetes mellitus. Hypoglycemia can be a life-threatening condition. Symptoms of hypoglycemia (sleepiness, dizziness, and disorientation) are similar to symptoms of having too much alcohol.  If your health care provider has given you approval to drink alcohol, do so in moderation and use the following guidelines:  Women should not have more than one drink per day, and men should not have more than two drinks per day. One drink is equal to:  12 oz of beer.  5 oz of wine.  1 oz of hard liquor.  Do not drink on an empty stomach.  Keep yourself hydrated. Have water, diet soda, or unsweetened iced tea.  Regular soda, juice, and other mixers might contain a lot of carbohydrates and should be counted. WHAT FOODS ARE NOT RECOMMENDED? As you make food choices, it is important to remember that all foods are not the same. Some foods have fewer nutrients per serving than other  foods, even though they might have the same number of calories or carbohydrates. It is difficult to get your body what it needs when you eat foods with fewer nutrients. Examples of foods that you should avoid that are high in calories and carbohydrates but low in nutrients include:  Trans fats (most processed foods list trans fats on the Nutrition Facts label).  Regular soda.  Juice.  Candy.  Sweets, such as cake, pie, doughnuts, and cookies.  Fried foods. WHAT FOODS CAN I EAT? Have nutrient-rich foods, which will nourish your body and keep you healthy. The food you should eat also will depend on several factors, including:  The calories you need.  The medicines you take.  Your weight.  Your blood glucose level.  Your blood pressure level.  Your cholesterol level. You also should eat a variety of foods, including:  Protein, such as meat, poultry, fish, tofu, nuts, and seeds (lean animal proteins are best).  Fruits.  Vegetables.  Dairy products, such as milk, cheese, and yogurt (low fat is best).  Breads, grains, pasta, cereal, rice, and beans.  Fats such as olive oil, trans fat-free margarine, canola oil, avocado, and olives. DOES EVERYONE WITH DIABETES MELLITUS HAVE THE SAME MEAL PLAN? Because every person with diabetes mellitus is different, there is not one meal plan that works for everyone. It is very important that you meet with a dietitian who will help you create a meal plan that is just right for you. Document Released: 04/26/2005 Document Revised: 08/04/2013 Document Reviewed: 06/26/2013 [employer] Patient Information 2015 [employer]. This   information is not intended to replace advice given to you by your health care provider. Make sure you discuss any questions you have with your health care provider. Diabetes and Exercise Exercising regularly is important. It is not just about losing weight. It has many health benefits, such as:  Improving your overall  fitness, flexibility, and endurance.  Increasing your bone density.  Helping with weight control.  Decreasing your body fat.  Increasing your muscle strength.  Reducing stress and tension.  Improving your overall health. People with diabetes who exercise gain additional benefits because exercise:  Reduces appetite.  Improves the body's use of blood sugar (glucose).  Helps lower or control blood glucose.  Decreases blood pressure.  Helps control blood lipids (such as cholesterol and triglycerides).  Improves the body's use of the hormone insulin by:  Increasing the body's insulin sensitivity.  Reducing the body's insulin needs.  Decreases the risk for heart disease because exercising:  Lowers cholesterol and triglycerides levels.  Increases the levels of good cholesterol (such as high-density lipoproteins [HDL]) in the body.  Lowers blood glucose levels. YOUR ACTIVITY PLAN  Choose an activity that you enjoy and set realistic goals. Your health care provider or diabetes educator can help you make an activity plan that works for you. Exercise regularly as directed by your health care provider. This includes:  Performing resistance training twice a week such as push-ups, sit-ups, lifting weights, or using resistance bands.  Performing 150 minutes of cardio exercises each week such as walking, running, or playing sports.  Staying active and spending no more than 90 minutes at one time being inactive. Even short bursts of exercise are good for you. Three 10-minute sessions spread throughout the day are just as beneficial as a single 30-minute session. Some exercise ideas include:  Taking the dog for a walk.  Taking the stairs instead of the elevator.  Dancing to your favorite song.  Doing an exercise video.  Doing your favorite exercise with a friend. RECOMMENDATIONS FOR EXERCISING WITH TYPE 1 OR TYPE 2 DIABETES   Check your blood glucose before exercising. If  blood glucose levels are greater than 240 mg/dL, check for urine ketones. Do not exercise if ketones are present.  Avoid injecting insulin into areas of the body that are going to be exercised. For example, avoid injecting insulin into:  The arms when playing tennis.  The legs when jogging.  Keep a record of:  Food intake before and after you exercise.  Expected peak times of insulin action.  Blood glucose levels before and after you exercise.  The type and amount of exercise you have done.  Review your records with your health care provider. Your health care provider will help you to develop guidelines for adjusting food intake and insulin amounts before and after exercising.  If you take insulin or oral hypoglycemic agents, watch for signs and symptoms of hypoglycemia. They include:  Dizziness.  Shaking.  Sweating.  Chills.  Confusion.  Drink plenty of water while you exercise to prevent dehydration or heat stroke. Body water is lost during exercise and must be replaced.  Talk to your health care provider before starting an exercise program to make sure it is safe for you. Remember, almost any type of activity is better than none. Document Released: 10/20/2003 Document Revised: 12/14/2013 Document Reviewed: 01/06/2013 [employer] Patient Information 2015 [employer]. This information is not intended to replace advice given to you by your health care provider. Make sure you discuss any   questions you have with your health care provider. DASH Eating Plan DASH stands for "Dietary Approaches to Stop Hypertension." The DASH eating plan is a healthy eating plan that has been shown to reduce high blood pressure (hypertension). Additional health benefits may include reducing the risk of type 2 diabetes mellitus, heart disease, and stroke. The DASH eating plan may also help with weight loss. WHAT DO I NEED TO KNOW ABOUT THE DASH EATING PLAN? For the DASH eating plan, you will follow  these general guidelines:  Choose foods with a percent daily value for sodium of less than 5% (as listed on the food label).  Use salt-free seasonings or herbs instead of table salt or sea salt.  Check with your health care provider or pharmacist before using salt substitutes.  Eat lower-sodium products, often labeled as "lower sodium" or "no salt added."  Eat fresh foods.  Eat more vegetables, fruits, and low-fat dairy products.  Choose whole grains. Look for the word "whole" as the first word in the ingredient list.  Choose fish and skinless chicken or [place] more often than red meat. Limit fish, poultry, and meat to 6 oz (170 g) each day.  Limit sweets, desserts, sugars, and sugary drinks.  Choose heart-healthy fats.  Limit cheese to 1 oz (28 g) per day.  Eat more home-cooked food and less restaurant, buffet, and fast food.  Limit fried foods.  Cook foods using methods other than frying.  Limit canned vegetables. If you do use them, rinse them well to decrease the sodium.  When eating at a restaurant, ask that your food be prepared with less salt, or no salt if possible. WHAT FOODS CAN I EAT? Seek help from a dietitian for individual calorie needs. Grains Whole grain or whole wheat bread. Brown rice. Whole grain or whole wheat pasta. Quinoa, bulgur, and whole grain cereals. Low-sodium cereals. Corn or whole wheat flour tortillas. Whole grain cornbread. Whole grain crackers. Low-sodium crackers. Vegetables Fresh or frozen vegetables (raw, steamed, roasted, or grilled). Low-sodium or reduced-sodium tomato and vegetable juices. Low-sodium or reduced-sodium tomato sauce and paste. Low-sodium or reduced-sodium canned vegetables.  Fruits All fresh, canned (in natural juice), or frozen fruits. Meat and Other Protein Products Ground beef (85% or leaner), grass-fed beef, or beef trimmed of fat. Skinless chicken or [place]. Ground chicken or [place]. Pork trimmed of fat. All fish and  seafood. Eggs. Dried beans, peas, or lentils. Unsalted nuts and seeds. Unsalted canned beans. Dairy Low-fat dairy products, such as skim or 1% milk, 2% or reduced-fat cheeses, low-fat ricotta or cottage cheese, or plain low-fat yogurt. Low-sodium or reduced-sodium cheeses. Fats and Oils Tub margarines without trans fats. Light or reduced-fat mayonnaise and salad dressings (reduced sodium). Avocado. Safflower, olive, or canola oils. Natural peanut or almond butter. Other Unsalted popcorn and pretzels. The items listed above may not be a complete list of recommended foods or beverages. Contact your dietitian for more options. WHAT FOODS ARE NOT RECOMMENDED? Grains White bread. White pasta. White rice. Refined cornbread. Bagels and croissants. Crackers that contain trans fat. Vegetables Creamed or fried vegetables. Vegetables in a cheese sauce. Regular canned vegetables. Regular canned tomato sauce and paste. Regular tomato and vegetable juices. Fruits Dried fruits. Canned fruit in light or heavy syrup. Fruit juice. Meat and Other Protein Products Fatty cuts of meat. Ribs, chicken wings, bacon, sausage, bologna, salami, chitterlings, fatback, hot dogs, bratwurst, and packaged luncheon meats. Salted nuts and seeds. Canned beans with salt. Dairy Whole or 2% milk, cream,  half-and-half, and cream cheese. Whole-fat or sweetened yogurt. Full-fat cheeses or blue cheese. Nondairy creamers and whipped toppings. Processed cheese, cheese spreads, or cheese curds. Condiments Onion and garlic salt, seasoned salt, table salt, and sea salt. Canned and packaged gravies. Worcestershire sauce. Tartar sauce. Barbecue sauce. Teriyaki sauce. Soy sauce, including reduced sodium. Steak sauce. Fish sauce. Oyster sauce. Cocktail sauce. Horseradish. Ketchup and mustard. Meat flavorings and tenderizers. Bouillon cubes. Hot sauce. Tabasco sauce. Marinades. Taco seasonings. Relishes. Fats and Oils Butter, stick margarine,  lard, shortening, ghee, and bacon fat. Coconut, palm kernel, or palm oils. Regular salad dressings. Other Pickles and olives. Salted popcorn and pretzels. The items listed above may not be a complete list of foods and beverages to avoid. Contact your dietitian for more information. WHERE CAN I FIND MORE INFORMATION? National Heart, [employer], [employer] Institute: travelstabloid.com Document Released: 07/19/2011 Document Revised: 12/14/2013 Document Reviewed: 06/03/2013 [facility] Patient Information 2015 [place], [place]. This information is not intended to replace advice given to you by your health care provider. Make sure you discuss any questions you have with your health care provider. Hypertension Hypertension, commonly called high blood pressure, is when the force of blood pumping through your arteries is too strong. Your arteries are the blood vessels that carry blood from your heart throughout your body. A blood pressure reading consists of a higher number over a lower number, such as 110/72. The higher number (systolic) is the pressure inside your arteries when your heart pumps. The lower number (diastolic) is the pressure inside your arteries when your heart relaxes. Ideally you want your blood pressure below 120/80. Hypertension forces your heart to work harder to pump blood. Your arteries may become narrow or stiff. Having hypertension puts you at risk for heart disease, stroke, and other problems.  RISK FACTORS Some risk factors for high blood pressure are controllable. Others are not.  Risk factors you cannot control include:   Race. You may be at higher risk if you are African American.  Age. Risk increases with age.  Gender. Men are at higher risk than women before age 48 years. After age 62, women are at higher risk than men. Risk factors you can control include:  Not getting enough exercise or physical activity.  Being overweight.  Getting too much  fat, sugar, calories, or salt in your diet.  Drinking too much alcohol. SIGNS AND SYMPTOMS Hypertension does not usually cause signs or symptoms. Extremely high blood pressure (hypertensive crisis) may cause headache, anxiety, shortness of breath, and nosebleed. DIAGNOSIS  To check if you have hypertension, your health care provider will measure your blood pressure while you are seated, with your arm held at the level of your heart. It should be measured at least twice using the same arm. Certain conditions can cause a difference in blood pressure between your right and left arms. A blood pressure reading that is higher than normal on one occasion does not mean that you need treatment. If one blood pressure reading is high, ask your health care provider about having it checked again. TREATMENT  Treating high blood pressure includes making lifestyle changes and possibly taking medicine. Living a healthy lifestyle can help lower high blood pressure. You may need to change some of your habits. Lifestyle changes may include:  Following the DASH diet. This diet is high in fruits, vegetables, and whole grains. It is low in salt, red meat, and added sugars.  Getting at least 2 hours of brisk physical activity every week.  Losing weight if necessary.  Not smoking.  Limiting alcoholic beverages.  Learning ways to reduce stress. If lifestyle changes are not enough to get your blood pressure under control, your health care provider may prescribe medicine. You may need to take more than one. Work closely with your health care provider to understand the risks and benefits. HOME CARE INSTRUCTIONS  Have your blood pressure rechecked as directed by your health care provider.   Take medicines only as directed by your health care provider. Follow the directions carefully. Blood pressure medicines must be taken as prescribed. The medicine does not work as well when you skip doses. Skipping doses also puts  you at risk for problems.   Do not smoke.   Monitor your blood pressure at home as directed by your health care provider. SEEK MEDICAL CARE IF:   You think you are having a reaction to medicines taken.  You have recurrent headaches or feel dizzy.  You have swelling in your ankles.  You have trouble with your vision. SEEK IMMEDIATE MEDICAL CARE IF:  You develop a severe headache or confusion.  You have unusual weakness, numbness, or feel faint.  You have severe chest or abdominal pain.  You vomit repeatedly.  You have trouble breathing. MAKE SURE YOU:   Understand these instructions.  Will watch your condition.  Will get help right away if you are not doing well or get worse. Document Released: 07/30/2005 Document Revised: 12/14/2013 Document Reviewed: 05/22/2013 [facility] Patient Information 2015 [place], [place]. This information is not intended to replace advice given to you by your health care provider. Make sure you discuss any questions you have with your health care provider.

## 2014-05-18 NOTE — Progress Notes (Signed)
Patient ID: [name] [name], male   DOB: 1956-06-01, 57 y.o.   MRN: [id number]   [name] [name], is a 58 y.o. male  ERX:[id number]  [id number]:[id number]  DOB - February 28, 1956  Chief Complaint  Patient presents with  . Follow-up        Subjective:   [name] [name] is a 58 y.o. male here today for a follow up visit. Patient has extensive past medical history as listed below on multiple medications also listed below. Today for routine follow-up of her medication refills.Pt reports having for 2 weeks off and on SOB, right flank pain, low back pain and he is waking up with headaches. Pt states that he quit taking his insulin b/c he thinks that the pain might be coming from these meds. Patient has No headache, No chest pain, No abdominal pain - No Nausea, No new weakness tingling or numbness, No Cough - SOB.  Problem  Type 2 Diabetes Mellitus Without Complication    ALLERGIES: Allergies  Allergen Reactions  . Ace Inhibitors     Cough  . Other Hives    Patient reports developing hives after receiving "some antibiotic years ago at [facility]". He does not know which antibiotic.    PAST MEDICAL HISTORY: Past Medical History  Diagnosis Date  . Hypertension   . Gout   . PAF (paroxysmal atrial fibrillation)     On coumadin  . Diabetes mellitus   . LIVER FUNCTION TESTS, ABNORMAL, HX OF 12/30/2006  . Rectal bleeding 12/18/2011    Scheduled for colonoscopy.    [name] HEPATITIS B, CHRONIC 12/30/2006  . OSTEOCHONDROMA 12/30/2006     LEFT SHOULDER   . HYPERCHOLESTEROLEMIA 07/11/2010  . MITRAL REGURGITATION 12/30/2006  . COLONIC POLYPS, HX OF 12/30/2006  . CAD (coronary artery disease), native coronary artery     a. Nonobstructive CAD by cath 2013 - diffuse distal and branch vessel CAD, no severe disease in the major coronaries, LV mild global hypokinesis, EF 45%. b. ETT-Sestamibi 5/14: EF 31%, small fixed inferior defect with no ischemia.  . Chronic CHF     a. Mixed ICM/NICM (?EtOH). EF 35% in  2008. Echo 5/13: EF 60-65%, mod LVH, EF 45% on V gram in 12/2011. EF 12/2012: EF 50-55%, mild LVH, inferobasal HK, mild MR. ETT-Ses 5/14 EF 41%. Cardiac MRI 5/14: EF 44%, mild global HK, subepicardial delayed enhancement in nonspecific RV insertion pattern.  . H/O atrial flutter 2007    a. Ablations in 2007, 2008.  [name] History of medication noncompliance   . History of alcohol abuse     MEDICATIONS AT HOME: Prior to Admission medications   Medication Sig Start Date End Date Taking? Authorizing Provider  amLODipine (NORVASC) 10 MG tablet Take 1 tablet (10 mg total) by mouth daily. 05/18/14  Yes Tresa Garter, MD  atorvastatin (LIPITOR) 40 MG tablet Take 1 tablet (40 mg total) by mouth daily at 6 PM. 05/18/14  Yes Dacoda Spallone E Doreene Burke, MD  carvedilol (COREG) 25 MG tablet Take 1 tablet (25 mg total) by mouth 2 (two) times daily with a meal. 05/18/14  Yes Tresa Garter, MD  furosemide (LASIX) 40 MG tablet Take 1 tablet (40 mg total) by mouth daily. 05/18/14  Yes Tresa Garter, MD  hydrALAZINE (APRESOLINE) 50 MG tablet Take 1 tablet (50 mg total) by mouth every 8 (eight) hours. 05/18/14  Yes Mabel Unrein Essie Christine, MD  insulin aspart (NOVOLOG) 100 UNIT/ML FlexPen Inject 7 Units into the skin 3 (three) times daily with meals.  05/18/14  Yes Tresa Garter, MD  insulin glargine (LANTUS) 100 UNIT/ML injection Inject 0.6 mLs (60 Units total) into the skin at bedtime. 05/18/14  Yes Tresa Garter, MD  isosorbide mononitrate (IMDUR) 60 MG 24 hr tablet Take 1 tablet (60 mg total) by mouth daily. 05/18/14  Yes Tresa Garter, MD  losartan (COZAAR) 100 MG tablet Take 1 tablet (100 mg total) by mouth daily. 05/18/14  Yes Tresa Garter, MD  metFORMIN (GLUCOPHAGE) 1000 MG tablet Take 1 tablet (1,000 mg total) by mouth 2 (two) times daily with a meal. 05/18/14  Yes Tresa Garter, MD  Potassium Chloride ER 20 MEQ TBCR Take 30 mEq by mouth daily. 05/18/14  Yes Tresa Garter, MD    warfarin (COUMADIN) 5 MG tablet Take 1 tablet (5 mg total) by mouth as directed. 05/18/14  Yes Tresa Garter, MD     Objective:   Filed Vitals:   05/18/14 1219  BP: 162/85  Pulse: 73  Temp: 98 F (36.7 C)  TempSrc: Oral  Resp: 16  Height: 6' (1.829 m)  Weight: 213 lb (96.616 kg)  SpO2: 97%    Exam General appearance : Awake, alert, not in any distress. Speech Clear. Not toxic looking HEENT: Atraumatic and Normocephalic, pupils equally reactive to light and accomodation Neck: supple, no JVD. No cervical lymphadenopathy.  Chest:Good air entry bilaterally, no added sounds  CVS: S1 S2 regular, no murmurs.  Abdomen: Bowel sounds present, Non tender and not distended with no gaurding, rigidity or rebound. Extremities: B/L Lower Ext shows no edema, both legs are warm to touch Neurology: Awake alert, and oriented X 3, CN II-XII intact, Non focal Skin:No Rash Wounds:N/A  Data Review Lab Results  Component Value Date   HGBA1C 7.3 05/18/2014   HGBA1C 11 11/11/2013   HGBA1C 7.4* 07/31/2013     Assessment & Plan   1. Type 2 diabetes mellitus without complication  - Glucose (CBG) - HgB A1c is 7.3% today down from 11% in April - Urinalysis Dipstick  - insulin glargine (LANTUS) 100 UNIT/ML injection; Inject 0.6 mLs (60 Units total) into the skin at bedtime.  Dispense: 10 mL; Refill: 11 - insulin aspart (NOVOLOG) 100 UNIT/ML FlexPen; Inject 7 Units into the skin 3 (three) times daily with meals.  Dispense: 15 mL; Refill: 3 - metFORMIN (GLUCOPHAGE) 1000 MG tablet; Take 1 tablet (1,000 mg total) by mouth 2 (two) times daily with a meal.  Dispense: 180 tablet; Refill: 3  Patient encouraged to continue his insulin. Headache might be from high blood pressure, we will focus on controlling blood pressure and if headaches persist we will investigate further  Aim for 2-3 Carb Choices per meal (30-45 grams) +/- 1 either way  Aim for 0-15 Carbs per snack if hungry  Include protein in  moderation with your meals and snacks  Consider reading food labels for Total Carbohydrate and Fat Grams of foods  Consider checking BG at alternate times per day  Continue taking medication as directed Fruit Punch - find one with no sugar  Measure and decrease portions of carbohydrate foods  Make your plate and don't go back for seconds   2. Essential hypertension  - hydrALAZINE (APRESOLINE) 50 MG tablet; Take 1 tablet (50 mg total) by mouth every 8 (eight) hours.  Dispense: 270 tablet; Refill: 3 - Potassium Chloride ER 20 MEQ TBCR; Take 30 mEq by mouth daily.  Dispense: 90 tablet; Refill: 3 - losartan (COZAAR) 100 MG tablet; Take  1 tablet (100 mg total) by mouth daily.  Dispense: 90 tablet; Refill: 3 - amLODipine (NORVASC) 10 MG tablet; Take 1 tablet (10 mg total) by mouth daily.  Dispense: 90 tablet; Refill: 3 - warfarin (COUMADIN) 5 MG tablet; Take 1 tablet (5 mg total) by mouth as directed.  Dispense: 90 tablet; Refill: 3  - We have discussed target BP range and blood pressure goal - I have advised patient to check BP regularly and to call us back or report to clinic if the numbers are consistently higher than 140/90  - We discussed the importance of compliance with medical therapy and DASH diet recommended, consequences of uncontrolled hypertension discussed.  - continue current BP medications  3. Chronic combined systolic and diastolic CHF (congestive heart failure)  - isosorbide mononitrate (IMDUR) 60 MG 24 hr tablet; Take 1 tablet (60 mg total) by mouth daily.  Dispense: 90 tablet; Refill: 3 - furosemide (LASIX) 40 MG tablet; Take 1 tablet (40 mg total) by mouth daily.  Dispense: 90 tablet; Refill: 3 - Potassium Chloride ER 20 MEQ TBCR; Take 30 mEq by mouth daily.  Dispense: 90 tablet; Refill: 3 - carvedilol (COREG) 25 MG tablet; Take 1 tablet (25 mg total) by mouth 2 (two) times daily with a meal.  Dispense: 180 tablet; Refill: 3  4. Dyslipidemia  - atorvastatin (LIPITOR) 40  MG tablet; Take 1 tablet (40 mg total) by mouth daily at 6 PM.  Dispense: 90 tablet; Refill: 3  To address this please limit saturated fat to no more than 7% of your calories, limit cholesterol to 200 mg/day, increase fiber and exercise as tolerated. If needed we may add another cholesterol lowering medication to your regimen.   Return in about 3 months (around 08/18/2014), or if symptoms worsen or fail to improve, for Hemoglobin A1C and Follow up, DM, Heart Failure and Hypertension.  The patient was given clear instructions to go to ER or return to medical center if symptoms don't improve, worsen or new problems develop. The patient verbalized understanding. The patient was told to call to get lab results if they haven't heard anything in the next week.   This note has been created with Surveyor, quantity. Any transcriptional errors are unintentional.    Angelica Chessman, MD, [place], [place], [place] and [place], [place]   05/18/2014, 1:13 PM

## 2014-05-24 ENCOUNTER — Ambulatory Visit: Payer: Medicare Other | Admitting: Physician Assistant

## 2014-06-02 ENCOUNTER — Encounter: Payer: Medicare Other | Admitting: Physician Assistant

## 2014-06-02 NOTE — Progress Notes (Signed)
This encounter was created in error - please disregard.

## 2014-06-02 NOTE — Progress Notes (Deleted)
Cardiology Office Note   Date:  06/02/2014   ID:  [name] [name], DOB 09-13-1955, MRN [id number]  PCP:  Angelica Chessman, MD  Cardiologist:  Dr. Loralie Champagne      History of Present Illness: [name] [name] is a 58 y.o. male with a hx of HTN, DM, PAF, CAD, CKD.  LHC in 5/13 demonstrated severe distal and branch vessel disease.  There was no target for PCI.  Disease was felt to be responsible for CP and med Rx was continued.  He had improvement with Ranolazine in the past.  Echo in 5/13 demonstrated normal LVF.  Admitted in 5/14 with hypertensive urgency.  He ran out of medications.  Seen in the office by D. Dunn, PA-C 10/2013 after a trip to the ED with CP and he was found to be in AFib with RVR.   Diltiazem 120 QD was added for rate control.  He was back in NSR in the office.    Last seen 8/31.  He returns for further E/M of his HTN.  ***   Studies:  - LHC (5/13):  LHC (5/13) with diffuse distal and branch vessel disease, mild global hypokinesis and EF 45%  - Echo (5/14):  inferbasal HK, mild LVH, EF 50-55%, trivial AI, mild MR, mild LAE  - Nuclear (5/14):  Inf defect c/w thinning vs infarct, no ischemia, EF 31%; High Risk  - cMRI (5/14): Mild LVH, EF 44%, normal RV size and function, small areas of subepicardial delayed enhancement of the mid anteroseptal and mid inferoseptal RV insertion sites-nonspecific finding   Recent Labs/Images: 10/05/2013: ALT 13  11/08/2013: Hemoglobin 15.0  11/11/2013: HDL Cholesterol by NMR 46; LDL (calc) 54; TSH 0.553  04/12/2014: Creatinine 1.0; Potassium 3.5; Pro B Natriuretic peptide (BNP) 75.0    Wt Readings from Last 3 Encounters:  05/18/14 213 lb (96.616 kg)  04/12/14 209 lb (94.802 kg)  03/12/14 212 lb (96.163 kg)     Past Medical History  Diagnosis Date  . Hypertension   . Gout   . PAF (paroxysmal atrial fibrillation)     On coumadin  . Diabetes mellitus   . LIVER FUNCTION TESTS, ABNORMAL, HX OF 12/30/2006  . Rectal bleeding  12/18/2011    Scheduled for colonoscopy.    [name] HEPATITIS B, CHRONIC 12/30/2006  . OSTEOCHONDROMA 12/30/2006     LEFT SHOULDER   . HYPERCHOLESTEROLEMIA 07/11/2010  . MITRAL REGURGITATION 12/30/2006  . COLONIC POLYPS, HX OF 12/30/2006  . CAD (coronary artery disease), native coronary artery     a. Nonobstructive CAD by cath 2013 - diffuse distal and branch vessel CAD, no severe disease in the major coronaries, LV mild global hypokinesis, EF 45%. b. ETT-Sestamibi 5/14: EF 31%, small fixed inferior defect with no ischemia.  . Chronic CHF     a. Mixed ICM/NICM (?EtOH). EF 35% in 2008. Echo 5/13: EF 60-65%, mod LVH, EF 45% on V gram in 12/2011. EF 12/2012: EF 50-55%, mild LVH, inferobasal HK, mild MR. ETT-Ses 5/14 EF 41%. Cardiac MRI 5/14: EF 44%, mild global HK, subepicardial delayed enhancement in nonspecific RV insertion pattern.  . H/O atrial flutter 2007    a. Ablations in 2007, 2008.  [name] History of medication noncompliance   . History of alcohol abuse     Current Outpatient Prescriptions  Medication Sig Dispense Refill  . amLODipine (NORVASC) 10 MG tablet Take 1 tablet (10 mg total) by mouth daily.  90 tablet  3  . atorvastatin (LIPITOR) 40 MG tablet Take 1  tablet (40 mg total) by mouth daily at 6 PM.  90 tablet  3  . carvedilol (COREG) 25 MG tablet Take 1 tablet (25 mg total) by mouth 2 (two) times daily with a meal.  180 tablet  3  . furosemide (LASIX) 40 MG tablet Take 1 tablet (40 mg total) by mouth daily.  90 tablet  3  . hydrALAZINE (APRESOLINE) 50 MG tablet Take 1 tablet (50 mg total) by mouth every 8 (eight) hours.  270 tablet  3  . insulin aspart (NOVOLOG) 100 UNIT/ML FlexPen Inject 7 Units into the skin 3 (three) times daily with meals.  15 mL  3  . insulin glargine (LANTUS) 100 UNIT/ML injection Inject 0.6 mLs (60 Units total) into the skin at bedtime.  10 mL  11  . isosorbide mononitrate (IMDUR) 60 MG 24 hr tablet Take 1 tablet (60 mg total) by mouth daily.  90 tablet  3  . losartan  (COZAAR) 100 MG tablet Take 1 tablet (100 mg total) by mouth daily.  90 tablet  3  . metFORMIN (GLUCOPHAGE) 1000 MG tablet Take 1 tablet (1,000 mg total) by mouth 2 (two) times daily with a meal.  180 tablet  3  . Potassium Chloride ER 20 MEQ TBCR Take 30 mEq by mouth daily.  90 tablet  3  . warfarin (COUMADIN) 5 MG tablet Take 1 tablet (5 mg total) by mouth as directed.  90 tablet  3   No current facility-administered medications for this visit.     Allergies:   Ace inhibitors and Other   Social History:  The patient  reports that he quit smoking about 30 years ago. He has never used smokeless tobacco. He reports that he drinks alcohol. He reports that he uses illicit drugs (Marijuana).   Family History:  The patient's family history includes Diabetes in his mother; Hypertension in his mother. There is no history of Heart attack.   ROS:  Please see the history of present illness.  ***   All other systems reviewed and negative.   PHYSICAL EXAM: VS:  There were no vitals taken for this visit. Well nourished, well developed, in no acute distress HEENT: normal Neck: no JVD Cardiac:  normal S1, S2; RRR; no murmur Lungs:  clear to auscultation bilaterally, no wheezing, rhonchi or rales Abd: soft, nontender, no hepatomegaly Ext: no edema Skin: warm and dry Neuro:  CNs 2-12 intact, no focal abnormalities noted  EKG:   ***    ASSESSMENT AND PLAN:  Chronic combined systolic and diastolic CHF:  ***  NICM (nonischemic cardiomyopathy):  ***Continue beta blocker, ARB, nitrates, hydralazine.    Coronary artery disease:  ***No angina.  Continue beta blocker, nitrates.  He is not on ASA as he is on coumadin.  Essential hypertension:  ***  Paroxysmal atrial fibrillation:   ***Maintaining NSR.  Continue coumadin.    HYPERCHOLESTEROLEMIA:   ***Continue statin.   Disposition:   FU with***Dr. Loralie Champagne***  Signed, Richardson Dopp, PA-C, MHS 06/02/2014 9:18 AM    [facility] [place], [place], [place]  [address] Phone: [phone]; Fax: [phone]

## 2014-06-07 ENCOUNTER — Ambulatory Visit (INDEPENDENT_AMBULATORY_CARE_PROVIDER_SITE_OTHER): Payer: Medicare Other

## 2014-06-07 DIAGNOSIS — I4891 Unspecified atrial fibrillation: Secondary | ICD-10-CM | POA: Diagnosis not present

## 2014-06-07 DIAGNOSIS — Z7901 Long term (current) use of anticoagulants: Secondary | ICD-10-CM

## 2014-06-07 DIAGNOSIS — Z5181 Encounter for therapeutic drug level monitoring: Secondary | ICD-10-CM | POA: Diagnosis not present

## 2014-06-07 LAB — POCT INR: INR: 1.5

## 2014-07-02 ENCOUNTER — Ambulatory Visit (INDEPENDENT_AMBULATORY_CARE_PROVIDER_SITE_OTHER): Payer: Medicare Other | Admitting: *Deleted

## 2014-07-02 DIAGNOSIS — Z5181 Encounter for therapeutic drug level monitoring: Secondary | ICD-10-CM

## 2014-07-02 DIAGNOSIS — I4891 Unspecified atrial fibrillation: Secondary | ICD-10-CM | POA: Diagnosis not present

## 2014-07-02 DIAGNOSIS — Z7901 Long term (current) use of anticoagulants: Secondary | ICD-10-CM | POA: Diagnosis not present

## 2014-07-02 LAB — POCT INR: INR: 2.4

## 2014-07-15 ENCOUNTER — Ambulatory Visit (INDEPENDENT_AMBULATORY_CARE_PROVIDER_SITE_OTHER): Payer: Medicare Other | Admitting: Cardiology

## 2014-07-15 ENCOUNTER — Encounter: Payer: Self-pay | Admitting: *Deleted

## 2014-07-15 ENCOUNTER — Encounter: Payer: Self-pay | Admitting: Cardiology

## 2014-07-15 VITALS — BP 150/80 | HR 85 | Ht 72.0 in | Wt 211.0 lb

## 2014-07-15 DIAGNOSIS — I10 Essential (primary) hypertension: Secondary | ICD-10-CM | POA: Diagnosis not present

## 2014-07-15 DIAGNOSIS — I2510 Atherosclerotic heart disease of native coronary artery without angina pectoris: Secondary | ICD-10-CM

## 2014-07-15 DIAGNOSIS — I5042 Chronic combined systolic (congestive) and diastolic (congestive) heart failure: Secondary | ICD-10-CM

## 2014-07-15 DIAGNOSIS — I480 Paroxysmal atrial fibrillation: Secondary | ICD-10-CM | POA: Diagnosis not present

## 2014-07-15 MED ORDER — HYDRALAZINE HCL 50 MG PO TABS: ORAL_TABLET | ORAL | Status: DC

## 2014-07-15 NOTE — Patient Instructions (Signed)
Increase hydralazine to 75mg  three times a day. This will be 1 and 1/2 of a 50mg  tablet every 8 hours.   Your physician recommends that you have  lab work today--BMET.  Your physician has requested that you have an echocardiogram. Echocardiography is a painless test that uses sound waves to create images of your heart. It provides your doctor with information about the size and shape of your heart and how well your heart's chambers and valves are working. This procedure takes approximately one hour. There are no restrictions for this procedure.  Your physician recommends that you schedule a follow-up appointment in: 2 months with Richardson Dopp, PA-C.  Your physician wants you to follow-up in: 6 months with Dr Aundra Dubin. (June 2016). You will receive a reminder letter in the mail two months in advance. If you don't receive a letter, please call our office to schedule the follow-up appointment.

## 2014-07-16 NOTE — Progress Notes (Signed)
Patient ID: [name] [name], male   DOB: 1956/04/07, 58 y.o.   MRN: [id number] PCP: Dr. Doreene Burke  58 yo with history of HTN, diabetes, paroxysmal atrial fibrillation, and CAD presents for cardiology followup.  He was admitted in 5/13 with chest pain with exertion.  LHC was done showing global HK with EF 45% and diffuse, severe distal and branch vessel disease.  There was no interventional option, but I suspect that this disease could be causing his anginal-type pain.  Echo at that time was read as showing EF 60-65%.  He was admitted in 5/14 with hypertensive urgency and chest pain.  He ruled out for MI and BP was controlled.  His EF was 50-55% by echo.  ETT-Sestamibi done as outpatient showed small fixed inferior defect with no ischemia but EF was 31%.  He was quite hypertensive during the study.  Cardiac MRI done to confirm EF in 5/14 showed EF 44%.  He had an episode of atrial fibrillation/RVR in 3/15 but went back into NSR.    He has been doing ok recently.  No tachypalpitations.  He is in NSR today.  BP is high, he says he has been taking all his meds except hydralazine which he is only taking bid.  No dyspnea with walking on flat ground or up a flight of steps.  He can walk up to 6 miles with no problems.  No chest pain.  No orthopnea/PND.  He is still drinking ETOH but rarely (every couple of weeks).   Labs (5/13): K 3.6, creatinine 0.93, LDL 85, HDL 57 Labs (7/13): K 3.9, creatinine 1.1, LDL 57, HDL 51  Labs (5/14): K 4, creatinine 1.25 Labs (8/14): K 3.7, creatinine 0.9, LDL 103, HDL 42 Labs (8/15): K 3.5 creatinine 1.0, BNP 75  ECG: NSR, LVH with repolarization abnormalities  PMH: 1. DM2 2. Gout 3. HTN: Cough with ACEI.  4. Cardiomyopathy: Suspect mixed ischemic/nonischemic (?ETOH-related).  EF 35% in 2008.  Echo in 5/13 showed EF 60-65% with moderate LVH but EF was 45% on LV-gram in 5/13.  Echo (5/14) with EF 50-55%, mild LVH, inferobasal HK, mild MR.  ETT-Sestamibi in 5/14, however, showed  EF 31%.  Cardiac MRI (6/14) with EF 44%, mild global hypokinesis, subepicardial delayed enhancement in a nonspecific RV insertion site pattern.  5. CAD: LHC in 2010 with mild nonobstructive disease.  LHC (5/13) with diffuse distal and branch vessel disease, mild global hypokinesis and EF 45%.  ETT-Sestamibi (5/14): EF 31%, small fixed inferior defect with no ischemia.  6. H/o chronic HBV 7. Osteochondroma left shoulder.  8. Hyperlipidemia 9. Paroxysmal atrial fibrillation: Coumadin.  Developed cough and increased ESR with amiodarone.  10. Atrial flutter: had ablations in 2007 and 2008.   SH: Married, prior smoker.  Some ETOH, occasionally heavy in past.  Does use occasional marijuana. Out of work Engineer, drilling. 2 daughters in grad school.   FH: CAD  ROS: All systems reviewed and negative except as per HPI.   Current Outpatient Prescriptions  Medication Sig Dispense Refill  . amLODipine (NORVASC) 10 MG tablet Take 1 tablet (10 mg total) by mouth daily. 90 tablet 3  . atorvastatin (LIPITOR) 40 MG tablet Take 1 tablet (40 mg total) by mouth daily at 6 PM. 90 tablet 3  . carvedilol (COREG) 25 MG tablet Take 1 tablet (25 mg total) by mouth 2 (two) times daily with a meal. 180 tablet 3  . furosemide (LASIX) 40 MG tablet Take 1 tablet (40 mg total) by mouth daily.  90 tablet 3  . insulin aspart (NOVOLOG) 100 UNIT/ML FlexPen Inject 7 Units into the skin 3 (three) times daily with meals. 15 mL 3  . insulin glargine (LANTUS) 100 UNIT/ML injection Inject 0.6 mLs (60 Units total) into the skin at bedtime. 10 mL 11  . isosorbide mononitrate (IMDUR) 60 MG 24 hr tablet Take 1 tablet (60 mg total) by mouth daily. 90 tablet 3  . losartan (COZAAR) 100 MG tablet Take 1 tablet (100 mg total) by mouth daily. 90 tablet 3  . metFORMIN (GLUCOPHAGE) 1000 MG tablet Take 1 tablet (1,000 mg total) by mouth 2 (two) times daily with a meal. 180 tablet 3  . Potassium Chloride ER 20 MEQ TBCR Take 30 mEq by mouth daily. 90  tablet 3  . warfarin (COUMADIN) 5 MG tablet Take 1 tablet (5 mg total) by mouth as directed. 90 tablet 3  . hydrALAZINE (APRESOLINE) 50 MG tablet 1 and 1/2 tablets (39m) every 8 hours 135 tablet 6   No current facility-administered medications for this visit.    BP 150/80 mmHg  Pulse 85  Ht 6' (1.829 m)  Wt 211 lb (95.709 kg)  BMI 28.61 kg/m2 General: NAD Neck: JVP 7 cm, no thyromegaly or thyroid nodule.  Lungs: Clear to auscultation bilaterally with normal respiratory effort. CV: Nondisplaced PMI.  Heart regular S1/S2, no S3/S4, no murmur.  No peripheral edema.  No carotid bruit.  Normal pedal pulses.  Abdomen: Soft, nontender, no hepatosplenomegaly, no distention.  Neurologic: Alert and oriented x 3.  Psych: Normal affect. Extremities: No clubbing or cyanosis.   Assessment/Plan:  Atrial fibrillation Paroxysmal atrial fibrillation, continue coumadin. He is in NSR today.  Coronary artery disease Diffuse distal and branch vessel disease not amenable to intervention on cath in 5/13. Last ETT-Sestamibi (5/14) with small inferior fixed defect, no ischemia.  No recent chest pain. - He is on warfarin so does not have to take ASA given stable disease.    - Continue Imdur for angina control.   - Continue statin.  HYPERCHOLESTEROLEMIA  Continue atorvastatin, check lipids.    Cardiomyopathy  EF 47% on cardiac MRI in 6/14.  Suspect mixed ischemic/nonischemic (?ETOH + HTN) cardiomyopathy.  - Continue current Coreg and losartan. - For BP control, will increase hydralazine to 75 mg tid and continue Imdur.  - Avoid ETOH. - He is euvolemic on current Lasix dose, will check BMET today.  - Echo to reassess EF.  Hypertension BP high today.  As above, increase hydralazine to 75 mg tid.  Addition of spironolactone would be next step.   DLoralie Champagne12/11/2013

## 2014-07-18 LAB — BASIC METABOLIC PANEL
BUN: 13 mg/dL (ref 6–23)
CO2: 24 meq/L (ref 19–32)
CREATININE: 1 mg/dL (ref 0.4–1.5)
Calcium: 9.4 mg/dL (ref 8.4–10.5)
Chloride: 105 mEq/L (ref 96–112)
GFR: 94.64 mL/min (ref >60.00)
Glucose, Bld: 148 mg/dL — ABNORMAL HIGH (ref 70–99)
Potassium: 3.9 mEq/L (ref 3.5–5.1)
Sodium: 137 mEq/L (ref 135–145)

## 2014-07-21 ENCOUNTER — Other Ambulatory Visit (HOSPITAL_COMMUNITY): Payer: Medicare Other

## 2014-07-22 ENCOUNTER — Encounter (HOSPITAL_COMMUNITY): Payer: Self-pay | Admitting: Cardiology

## 2014-07-30 ENCOUNTER — Ambulatory Visit (INDEPENDENT_AMBULATORY_CARE_PROVIDER_SITE_OTHER): Payer: Medicare Other | Admitting: Pharmacist

## 2014-07-30 DIAGNOSIS — Z5181 Encounter for therapeutic drug level monitoring: Secondary | ICD-10-CM | POA: Diagnosis not present

## 2014-07-30 DIAGNOSIS — Z7901 Long term (current) use of anticoagulants: Secondary | ICD-10-CM

## 2014-07-30 DIAGNOSIS — I4891 Unspecified atrial fibrillation: Secondary | ICD-10-CM

## 2014-07-30 LAB — POCT INR: INR: 1.2

## 2014-08-11 ENCOUNTER — Ambulatory Visit (INDEPENDENT_AMBULATORY_CARE_PROVIDER_SITE_OTHER): Payer: Medicare Other | Admitting: *Deleted

## 2014-08-11 DIAGNOSIS — I4891 Unspecified atrial fibrillation: Secondary | ICD-10-CM | POA: Diagnosis not present

## 2014-08-11 DIAGNOSIS — Z7901 Long term (current) use of anticoagulants: Secondary | ICD-10-CM | POA: Diagnosis not present

## 2014-08-11 DIAGNOSIS — Z5181 Encounter for therapeutic drug level monitoring: Secondary | ICD-10-CM | POA: Diagnosis not present

## 2014-08-11 LAB — POCT INR: INR: 3.3

## 2014-08-25 ENCOUNTER — Ambulatory Visit (INDEPENDENT_AMBULATORY_CARE_PROVIDER_SITE_OTHER): Payer: Medicare Other | Admitting: *Deleted

## 2014-08-25 DIAGNOSIS — I4891 Unspecified atrial fibrillation: Secondary | ICD-10-CM

## 2014-08-25 DIAGNOSIS — Z5181 Encounter for therapeutic drug level monitoring: Secondary | ICD-10-CM

## 2014-08-25 DIAGNOSIS — Z7901 Long term (current) use of anticoagulants: Secondary | ICD-10-CM | POA: Diagnosis not present

## 2014-08-25 LAB — POCT INR: INR: 2.1

## 2014-09-15 ENCOUNTER — Telehealth: Payer: Self-pay | Admitting: *Deleted

## 2014-09-15 ENCOUNTER — Ambulatory Visit (INDEPENDENT_AMBULATORY_CARE_PROVIDER_SITE_OTHER): Payer: Medicare Other | Admitting: *Deleted

## 2014-09-15 ENCOUNTER — Ambulatory Visit (INDEPENDENT_AMBULATORY_CARE_PROVIDER_SITE_OTHER): Payer: Medicare Other | Admitting: Physician Assistant

## 2014-09-15 ENCOUNTER — Encounter: Payer: Self-pay | Admitting: Physician Assistant

## 2014-09-15 ENCOUNTER — Telehealth: Payer: Self-pay | Admitting: Physician Assistant

## 2014-09-15 VITALS — BP 142/72 | HR 92 | Ht 72.0 in | Wt 209.0 lb

## 2014-09-15 DIAGNOSIS — I2510 Atherosclerotic heart disease of native coronary artery without angina pectoris: Secondary | ICD-10-CM

## 2014-09-15 DIAGNOSIS — I480 Paroxysmal atrial fibrillation: Secondary | ICD-10-CM | POA: Diagnosis not present

## 2014-09-15 DIAGNOSIS — I429 Cardiomyopathy, unspecified: Secondary | ICD-10-CM | POA: Diagnosis not present

## 2014-09-15 DIAGNOSIS — I5042 Chronic combined systolic (congestive) and diastolic (congestive) heart failure: Secondary | ICD-10-CM

## 2014-09-15 DIAGNOSIS — I10 Essential (primary) hypertension: Secondary | ICD-10-CM | POA: Diagnosis not present

## 2014-09-15 DIAGNOSIS — Z5181 Encounter for therapeutic drug level monitoring: Secondary | ICD-10-CM

## 2014-09-15 DIAGNOSIS — I4891 Unspecified atrial fibrillation: Secondary | ICD-10-CM | POA: Diagnosis not present

## 2014-09-15 DIAGNOSIS — R0683 Snoring: Secondary | ICD-10-CM | POA: Diagnosis not present

## 2014-09-15 DIAGNOSIS — Z7901 Long term (current) use of anticoagulants: Secondary | ICD-10-CM | POA: Diagnosis not present

## 2014-09-15 DIAGNOSIS — E785 Hyperlipidemia, unspecified: Secondary | ICD-10-CM | POA: Diagnosis not present

## 2014-09-15 LAB — POCT INR: INR: 1.6

## 2014-09-15 LAB — BASIC METABOLIC PANEL
BUN: 14 mg/dL (ref 6–23)
CHLORIDE: 100 meq/L (ref 96–112)
CO2: 29 mEq/L (ref 19–32)
CREATININE: 1.05 mg/dL (ref 0.40–1.50)
Calcium: 9.4 mg/dL (ref 8.4–10.5)
GFR: 92.52 mL/min (ref >60.00)
Glucose, Bld: 372 mg/dL — ABNORMAL HIGH (ref 70–99)
Potassium: 4 mEq/L (ref 3.5–5.1)
SODIUM: 136 meq/L (ref 135–145)

## 2014-09-15 MED ORDER — SPIRONOLACTONE 25 MG PO TABS: 25.0000 mg | ORAL_TABLET | Freq: Every day | ORAL | Status: DC

## 2014-09-15 MED ORDER — POTASSIUM CHLORIDE ER 20 MEQ PO TBCR: 0.5000 | EXTENDED_RELEASE_TABLET | Freq: Every day | ORAL | Status: DC

## 2014-09-15 NOTE — Telephone Encounter (Signed)
pt notified of lab results with verbal understanding. Pt advised to f/u w/PCP glucose too high. Pt said ok and thank you.

## 2014-09-15 NOTE — Telephone Encounter (Signed)
Fu echo ordered by Dr. Loralie Champagne in 07/2014. This was never done. Have patient schedule test. Richardson Dopp, PA-C   09/15/2014 5:57 PM

## 2014-09-15 NOTE — Progress Notes (Signed)
Cardiology Office Note   Date:  09/15/2014   ID:  [name] [name], DOB May 24, 1956, MRN [id number]  PCP:  Angelica Chessman, MD  Cardiologist:  Dr. Loralie Champagne     Chief Complaint  Patient presents with  . Hypertension    follow up  . Coronary Artery Disease     History of Present Illness: [name] [name] is a 59 y.o. male with a hx of HTN, DM, PAF, CAD, CKD. LHC in 5/13 demonstrated severe distal and branch vessel disease. There was no target for PCI. Disease was felt to be responsible for CP and med Rx was continued. He had improvement with Ranolazine in the past. Echo in 5/13 demonstrated normal LVF. Admitted in 5/14 with hypertensive urgency. He ran out of medications. Seen in the office by D. Dunn, PA-C 10/2013 after a trip to the ED with CP and he was found to be in AFib with RVR. Diltiazem 120 QD was added for rate control. He was back in NSR in the office.  He was last seen by Dr. Aundra Dubin in 12/15. His blood pressure is elevated and his hydralazine was adjusted. He returns for follow-up today. He is not really certain what his blood pressures at home look like. He says they are "a little high.". He has not taken his medications yet today. He denies chest discomfort. He notes chronic dyspnea with exertion. He is NYHA 2-2b. He denies any changes. He sleeps on 2-3 pillows chronically. He denies PND. He denies edema. He denies syncope.   Studies: - LHC (5/13): LHC (5/13) with diffuse distal and branch vessel disease, mild global hypokinesis and EF 45% - Echo (5/14): inferbasal HK, mild LVH, EF 50-55%, trivial AI, mild MR, mild LAE - Nuclear (5/14): Inf defect c/w thinning vs infarct, no ischemia, EF 31%; High Risk - cMRI (5/14): Mild LVH, EF 44%, normal RV size and function, small areas of subepicardial delayed enhancement of the mid anteroseptal and mid inferoseptal RV insertion sites-nonspecific finding   Past Medical History  Diagnosis Date  . Hypertension    . Gout   . PAF (paroxysmal atrial fibrillation)     On coumadin  . Diabetes mellitus   . LIVER FUNCTION TESTS, ABNORMAL, HX OF 12/30/2006  . Rectal bleeding 12/18/2011    Scheduled for colonoscopy.    [name] HEPATITIS B, CHRONIC 12/30/2006  . OSTEOCHONDROMA 12/30/2006     LEFT SHOULDER   . HYPERCHOLESTEROLEMIA 07/11/2010  . MITRAL REGURGITATION 12/30/2006  . COLONIC POLYPS, HX OF 12/30/2006  . CAD (coronary artery disease), native coronary artery     a. Nonobstructive CAD by cath 2013 - diffuse distal and branch vessel CAD, no severe disease in the major coronaries, LV mild global hypokinesis, EF 45%. b. ETT-Sestamibi 5/14: EF 31%, small fixed inferior defect with no ischemia.  . Chronic CHF     a. Mixed ICM/NICM (?EtOH). EF 35% in 2008. Echo 5/13: EF 60-65%, mod LVH, EF 45% on V gram in 12/2011. EF 12/2012: EF 50-55%, mild LVH, inferobasal HK, mild MR. ETT-Ses 5/14 EF 41%. Cardiac MRI 5/14: EF 44%, mild global HK, subepicardial delayed enhancement in nonspecific RV insertion pattern.  . H/O atrial flutter 2007    a. Ablations in 2007, 2008.  [name] History of medication noncompliance   . History of alcohol abuse     Past Surgical History  Procedure Laterality Date  . Osteochondroma excision  1972  . Coronary angioplasty  12/05/01  . A flutter ablation  2007, 2008  catheter ablation   . Eye surgery    . Left heart catheterization with coronary angiogram N/A 12/19/2011    Procedure: LEFT HEART CATHETERIZATION WITH CORONARY ANGIOGRAM;  Surgeon: Larey Dresser, MD;  Location: [facility] CATH LAB;  Service: Cardiovascular;  Laterality: N/A;     Current Outpatient Prescriptions  Medication Sig Dispense Refill  . amLODipine (NORVASC) 10 MG tablet Take 1 tablet (10 mg total) by mouth daily. 90 tablet 3  . atorvastatin (LIPITOR) 40 MG tablet Take 1 tablet (40 mg total) by mouth daily at 6 PM. 90 tablet 3  . carvedilol (COREG) 25 MG tablet Take 1 tablet (25 mg total) by mouth 2 (two) times daily with a meal.  180 tablet 3  . furosemide (LASIX) 40 MG tablet Take 1 tablet (40 mg total) by mouth daily. 90 tablet 3  . hydrALAZINE (APRESOLINE) 50 MG tablet 1 and 1/2 tablets (75mg ) every 8 hours 135 tablet 6  . insulin aspart (NOVOLOG) 100 UNIT/ML FlexPen Inject 7 Units into the skin 3 (three) times daily with meals. 15 mL 3  . insulin glargine (LANTUS) 100 UNIT/ML injection Inject 0.6 mLs (60 Units total) into the skin at bedtime. 10 mL 11  . isosorbide mononitrate (IMDUR) 60 MG 24 hr tablet Take 1 tablet (60 mg total) by mouth daily. 90 tablet 3  . losartan (COZAAR) 100 MG tablet Take 1 tablet (100 mg total) by mouth daily. 90 tablet 3  . metFORMIN (GLUCOPHAGE) 1000 MG tablet Take 1 tablet (1,000 mg total) by mouth 2 (two) times daily with a meal. 180 tablet 3  . Potassium Chloride ER 20 MEQ TBCR Take 30 mEq by mouth daily. 90 tablet 3  . warfarin (COUMADIN) 5 MG tablet Take 1 tablet (5 mg total) by mouth as directed. 90 tablet 3   No current facility-administered medications for this visit.    Allergies:   Ace inhibitors and Other    Social History:  The patient  reports that he quit smoking about 31 years ago. He has never used smokeless tobacco. He reports that he drinks alcohol. He reports that he uses illicit drugs (Marijuana).   Family History:  The patient's family history includes Diabetes in his mother; Hypertension in his mother. There is no history of Heart attack or Stroke.    ROS:  Please see the history of present illness.   Otherwise, review of systems are positive for diarrhea, snoring, morning headaches, daytime sleepiness, right upper extremity rash.   All other systems are reviewed and negative.    PHYSICAL EXAM: VS:  BP 142/72 mmHg  Pulse 92  Ht 6' (1.829 m)  Wt 209 lb (94.802 kg)  BMI 28.34 kg/m2    Wt Readings from Last 3 Encounters:  09/15/14 209 lb (94.802 kg)  07/15/14 211 lb (95.709 kg)  05/18/14 213 lb (96.616 kg)     GEN: Well nourished, well developed, in no  acute distress HEENT: normal Neck: no JVD, no masses Cardiac:  Normal S1/S2, RRR; no murmur, no rubs or gallops, no edema  Respiratory:  clear to auscultation bilaterally, no wheezing, rhonchi or rales. GI: soft, nontender, nondistended, + BS MS: no deformity or atrophy Skin: warm and dry;  Annular rash on RUE c/w tinea cruris  Neuro:  CNs II-XII intact, Strength and sensation are intact Psych: Normal affect   EKG:  EKG is ordered today.  It demonstrates:   NSR, HR 92, LVH with repolarization abnormality, no change from prior tracing   Recent  Labs: 10/05/2013: ALT 13 11/08/2013: Hemoglobin 15.0; Platelets 145* 11/11/2013: TSH 0.553 04/12/2014: Pro B Natriuretic peptide (BNP) 75.0 07/15/2014: BUN 13; Creatinine 1.0; Potassium 3.9; Sodium 137    Lipid Panel    Component Value Date/Time   CHOL 135 11/11/2013 1500   TRIG 175* 11/11/2013 1500   HDL 46 11/11/2013 1500   CHOLHDL 2.9 11/11/2013 1500   VLDL 35 11/11/2013 1500   LDLCALC 54 11/11/2013 1500   LDLDIRECT 135.0 10/20/2012 1107      ASSESSMENT AND PLAN:  1.  Atrial fibrillation:  Paroxysmal atrial fibrillation, continue coumadin. He is in NSR today.  2.  Coronary artery disease:  Diffuse distal and branch vessel disease not amenable to intervention on cath in 5/13. Last ETT-Sestamibi (5/14) with small inferior fixed defect, no ischemia. No recent chest pain.   -  He is on warfarin so does not have to take ASA given stable disease.    -  Continue Imdur, amlodipine, carvedilol for angina control.    -  Continue statin.  3.  HYPERCHOLESTEROLEMIA:  Continue atorvastatin. 4.  Cardiomyopathy:  EF 47% on cardiac MRI in 6/14. Suspect mixed ischemic/nonischemic (?ETOH + HTN) cardiomyopathy.     - Continue Coreg, losartan, hydralazine, isosorbide.    - Start Spironolactone 25 mg QD.  Decrease K+ to 10 mEq QD.  Check BMET in 5 days and 12 days.    - Avoid ETOH.    - He is euvolemic on current Lasix dose.  Check BMET today.      - Echo was ordered by Dr. Aundra Dubin at last visit to reassess EF. This was never done.  I will try to get him to schedule. 5.  Hypertension:  BP better today. As above, start Spironolactone.  Check BP with RN at next lab visit.  6.  Snoring:  He has symptoms of OSA.  Arrange split night sleep study.    Current medicines are reviewed at length with the patient today.  The patient does not have concerns regarding medicines.  The following changes have been made:  As above.   Labs/ tests ordered today include:   Orders Placed This Encounter  Procedures  . Basic Metabolic Panel (BMET)  . Basic Metabolic Panel (BMET)  . Basic Metabolic Panel (BMET)  . EKG 12-Lead  . Split night study  . PR OFFICE OUTPATIENT VISIT 5 MINUTES     Disposition:   FU with Dr. Loralie Champagne in 01/2015.   Signed, Versie Starks, MHS 09/15/2014 10:27 AM    [name] [address], [place], [place]  [address] Phone: [phone]; Fax: [phone]

## 2014-09-15 NOTE — Patient Instructions (Addendum)
Your physician recommends that you schedule a follow-up appointment in: 01/2015 WITH DR. Aundra Dubin  LAB WORK TODAY; BMET  YOU WILL NEED REPEAT BMET IN 2/8 AND THEN AGAIN ON 2/15  Your physician has recommended you make the following change in your medication:  1. DECREASE POTASSIUM TO 10 MEQ DAILY (THIS WILL BE 1/2 TAB OF THE 20 MEQ TAB) 2. START SPIRONOLACTONE 25 MG 1 TABLET DAILY  YOU WILL NEED A NURSE VISIT FOR A BLOOD PRESSURE CHECK DUE TO MEDICATION CHANGES AND NEW START SPIRONOLACTONE   SCHEDULE A SPLIT NIGHT SLEEP STUDY; DX SNORING

## 2014-09-20 ENCOUNTER — Other Ambulatory Visit: Payer: Medicare Other

## 2014-09-21 NOTE — Telephone Encounter (Signed)
lmptcb to schedule his echo that he never had done, as well as Servando Snare has brought to me that pt's BP visit for 2/15 that was scheduled at check out is not at a valid time and will need to be rescheduled.

## 2014-09-24 NOTE — Telephone Encounter (Signed)
lmptcb on both pt's # as well as DPR on file for [name] [name] to reschedule his BP appt due to schedule in error by [facility], as well schedule for echo that should had been done already.

## 2014-09-24 NOTE — Telephone Encounter (Signed)
s/w pt's daughter Montine Circle on file. Rescheduled pt's BP check from 2/15 to 2/17, as well as echo same day. Daughter verbalized plan of care.

## 2014-09-29 ENCOUNTER — Ambulatory Visit (HOSPITAL_COMMUNITY): Payer: Medicare Other | Attending: Cardiology | Admitting: Cardiology

## 2014-09-29 ENCOUNTER — Ambulatory Visit (INDEPENDENT_AMBULATORY_CARE_PROVIDER_SITE_OTHER): Payer: Medicare Other | Admitting: *Deleted

## 2014-09-29 ENCOUNTER — Other Ambulatory Visit (HOSPITAL_COMMUNITY): Payer: Medicare Other

## 2014-09-29 ENCOUNTER — Other Ambulatory Visit (INDEPENDENT_AMBULATORY_CARE_PROVIDER_SITE_OTHER): Payer: Medicare Other | Admitting: *Deleted

## 2014-09-29 VITALS — BP 110/58 | HR 77

## 2014-09-29 DIAGNOSIS — I5042 Chronic combined systolic (congestive) and diastolic (congestive) heart failure: Secondary | ICD-10-CM

## 2014-09-29 DIAGNOSIS — I4891 Unspecified atrial fibrillation: Secondary | ICD-10-CM

## 2014-09-29 DIAGNOSIS — I059 Rheumatic mitral valve disease, unspecified: Secondary | ICD-10-CM | POA: Diagnosis not present

## 2014-09-29 DIAGNOSIS — I10 Essential (primary) hypertension: Secondary | ICD-10-CM | POA: Diagnosis not present

## 2014-09-29 DIAGNOSIS — I429 Cardiomyopathy, unspecified: Secondary | ICD-10-CM

## 2014-09-29 DIAGNOSIS — I428 Other cardiomyopathies: Secondary | ICD-10-CM

## 2014-09-29 DIAGNOSIS — I480 Paroxysmal atrial fibrillation: Secondary | ICD-10-CM

## 2014-09-29 DIAGNOSIS — Z5181 Encounter for therapeutic drug level monitoring: Secondary | ICD-10-CM | POA: Diagnosis not present

## 2014-09-29 DIAGNOSIS — Z136 Encounter for screening for cardiovascular disorders: Secondary | ICD-10-CM | POA: Diagnosis not present

## 2014-09-29 DIAGNOSIS — Z7901 Long term (current) use of anticoagulants: Secondary | ICD-10-CM | POA: Diagnosis not present

## 2014-09-29 DIAGNOSIS — Z0130 Encounter for examination of blood pressure without abnormal findings: Secondary | ICD-10-CM

## 2014-09-29 DIAGNOSIS — R609 Edema, unspecified: Secondary | ICD-10-CM

## 2014-09-29 LAB — BASIC METABOLIC PANEL
BUN: 19 mg/dL (ref 6–23)
CALCIUM: 9.5 mg/dL (ref 8.4–10.5)
CHLORIDE: 99 meq/L (ref 96–112)
CO2: 27 meq/L (ref 19–32)
CREATININE: 1.1 mg/dL (ref 0.40–1.50)
GFR: 87.67 mL/min (ref >60.00)
GLUCOSE: 291 mg/dL — AB (ref 70–99)
POTASSIUM: 3.8 meq/L (ref 3.5–5.1)
Sodium: 133 mEq/L — ABNORMAL LOW (ref 135–145)

## 2014-09-29 LAB — POCT INR: INR: 1.2

## 2014-09-29 MED ORDER — SPIRONOLACTONE 25 MG PO TABS: 12.5000 mg | ORAL_TABLET | Freq: Every day | ORAL | Status: DC

## 2014-09-29 NOTE — Progress Notes (Signed)
Agree  Richardson Dopp, PA-C   09/29/2014 2:11 PM

## 2014-09-29 NOTE — Progress Notes (Signed)
Echo performed. 

## 2014-09-29 NOTE — Progress Notes (Signed)
1.) Reason for visit: BP check  2.) Name of MD requesting visit: Richardson Dopp, PA  3.) H&P: hx htn, cardiomyopathy, afib,  last OV note BP was 142/72, spironolactone 25 mg daily started. Potassium decreased to 10 mEq daily  4.) ROS related to problem: BP 110/72, HR 77  He reports feeling weak, tired. Pt admits to non compliance with medications, does not take regularly.  5.) Assessment and plan per MD: discussed with Richardson Dopp, PA, instructed for patient to decrease spironolactone to 1/2 tab daily. Patient verbalizes understanding and agreement.  Pt needs lab work today, as well as echo.

## 2014-09-29 NOTE — Patient Instructions (Signed)
Your physician has recommended you make the following change in your medication:  1.) spironolactone 25 mg take 1/2 tablet daily  Your physician recommends that you return for lab work in: today (BMET, BNP)

## 2014-10-01 ENCOUNTER — Telehealth: Payer: Self-pay | Admitting: *Deleted

## 2014-10-01 NOTE — Telephone Encounter (Signed)
pt notified about lab results with verbal understanding  

## 2014-10-08 ENCOUNTER — Ambulatory Visit (INDEPENDENT_AMBULATORY_CARE_PROVIDER_SITE_OTHER): Payer: Medicare Other | Admitting: *Deleted

## 2014-10-08 DIAGNOSIS — Z7901 Long term (current) use of anticoagulants: Secondary | ICD-10-CM

## 2014-10-08 DIAGNOSIS — Z5181 Encounter for therapeutic drug level monitoring: Secondary | ICD-10-CM

## 2014-10-08 DIAGNOSIS — I4891 Unspecified atrial fibrillation: Secondary | ICD-10-CM

## 2014-10-08 LAB — POCT INR: INR: 1.6

## 2014-11-24 ENCOUNTER — Ambulatory Visit (HOSPITAL_BASED_OUTPATIENT_CLINIC_OR_DEPARTMENT_OTHER): Payer: Medicare Other | Attending: Physician Assistant | Admitting: Radiology

## 2014-11-24 VITALS — Ht 72.0 in | Wt 213.0 lb

## 2014-11-24 DIAGNOSIS — G4719 Other hypersomnia: Secondary | ICD-10-CM

## 2014-11-24 DIAGNOSIS — G4733 Obstructive sleep apnea (adult) (pediatric): Secondary | ICD-10-CM

## 2014-11-24 DIAGNOSIS — R0683 Snoring: Secondary | ICD-10-CM

## 2014-11-25 ENCOUNTER — Telehealth: Payer: Self-pay | Admitting: Cardiology

## 2014-11-25 DIAGNOSIS — G4733 Obstructive sleep apnea (adult) (pediatric): Secondary | ICD-10-CM | POA: Insufficient documentation

## 2014-11-25 DIAGNOSIS — G4719 Other hypersomnia: Secondary | ICD-10-CM | POA: Insufficient documentation

## 2014-11-25 NOTE — Sleep Study (Signed)
   NAME: [name] [name] DATE OF BIRTH:  03-17-1956 MEDICAL RECORD NUMBER [id number]  LOCATION: [facility]  PHYSICIAN: TURNER,TRACI R  DATE OF STUDY: 11/24/2014  SLEEP STUDY TYPE: Nocturnal Polysomnogram               REFERRING PHYSICIAN: Richardson Dopp T, PA-C  INDICATION FOR STUDY: snoring, excessive daytime sleepiness  EPWORTH SLEEPINESS SCORE: 8 HEIGHT: 6' (182.9 cm)  WEIGHT: 213 lb (96.616 kg)    Body mass index is 28.88 kg/(m^2).  NECK SIZE: 17.5 in.  MEDICATIONS: Reviewed in the chart  SLEEP ARCHITECTURE: The patient exercised for a total of 281 minutes out of a total sleep period of 340 minutes.  There was no slow wave sleep and 114 minutes of REM sleep.  The onset to sleep latency was normal at 14 minutes.  The onset to REM sleep latency was short at 50 minutes.  The sleep efficiency was reduced at 74%.  There was an increased number of arousals due to respiratory events with a total arousal index of 18.5 due to respiratory and spontaneous arousals.  RESPIRATORY DATA: There were a total of 29 apneas, of which, 2 were obstructive, 19 were central and 8 mixed apneas.  There were 15 hypopneas.  Most events occurred in the supine position during NREM sleep.  The AHI was 9.4 events per hour consistent with mild obstructive sleep apnea/hypopnea syndrome.  There was mild to moderate snoring noted.  OXYGEN DATA: The average oxygen saturation was 93% and the minimum oxygen saturation was 87%.  The time spent with oxygen saturations below 88% was 25 minutes.  CARDIAC DATA: The patient maintained NSR throughout the study with an average heart rate of 74 bpm and the lowest heart rate of 60bpm.    MOVEMENT/PARASOMNIA: There were no periodic limb movements or REM sleep behavior disorders noted.    IMPRESSION/ RECOMMENDATION:   1.  Mild obstructive sleep apnea/hypopnea syndrome with greater than 50% of apneas as central apneas.  The AHI was 9.4 events per hour.   Most  events occurred in the supine position during NREM sleep. 2.  Mild to moderate snoring was noted. 3.  Oxygen desaturations as low as 87% with time spent with oxygen saturations below 88% was 25 minutes. 4.  Reduced sleep efficiency with increased frequency of arousals due to respiratory events.   5.  Abnormal sleep architecture due to no slow wave sleep. 6.  The patient should be counseled in good sleep hygiene. 7.  The patient should be counseled to avoid sleeping in the supine position.   8.  Given the degree of sleep disordered breathing with >50% of apneas as central events, significant oxygen desaturations <88% for over 25 minutes and excessive daytime sleepiness, recommend a CPAP titration.  Other options for treatment include oral device and ENT referral for evaluation of surgical causes of sleep apnea.   Signed:  Sueanne Margarita Diplomate, American Board of Sleep Medicine  ELECTRONICALLY SIGNED ON:  11/25/2014, 8:50 PM [place] PH: [phone]   FX: [phone] [place]

## 2014-11-25 NOTE — Telephone Encounter (Signed)
Please let patient know that they have sleep apnea and recommend CPAP titration. Please set up titration in the sleep lab. 

## 2014-11-26 NOTE — Addendum Note (Signed)
Addended by: Andres Ege on: 11/26/2014 02:13 PM   Modules accepted: Orders

## 2014-11-26 NOTE — Telephone Encounter (Signed)
Pt is aware of results and is okay to proceed with CPAP titration.  Orders have been placed, and a message sent for scheduling

## 2014-11-30 ENCOUNTER — Telehealth: Payer: Self-pay | Admitting: Pharmacist Clinician (PhC)/ Clinical Pharmacy Specialist

## 2014-11-30 ENCOUNTER — Ambulatory Visit (INDEPENDENT_AMBULATORY_CARE_PROVIDER_SITE_OTHER): Payer: Medicare Other

## 2014-11-30 DIAGNOSIS — Z5181 Encounter for therapeutic drug level monitoring: Secondary | ICD-10-CM | POA: Diagnosis not present

## 2014-11-30 DIAGNOSIS — Z7901 Long term (current) use of anticoagulants: Secondary | ICD-10-CM | POA: Diagnosis not present

## 2014-11-30 DIAGNOSIS — I4891 Unspecified atrial fibrillation: Secondary | ICD-10-CM | POA: Diagnosis not present

## 2014-11-30 LAB — POCT INR: INR: 1.6

## 2014-11-30 NOTE — Telephone Encounter (Signed)
Fax request from Dr. Earlean Shawl for patient to hold warfarin prior to colonoscopy scheduled for 4.28.   Per office protocol, CHADS2 score is 3, will hold warfarin without lovenox bridge.  Pt called and informed to hold warfarin after dose this Friday.  Restart next Thursday after colonoscopy, unless told otherwise after procedure by Dr Earlean Shawl.  Pt voiced understanding.   Form faxed back to Dr.

## 2014-12-08 DIAGNOSIS — Z7901 Long term (current) use of anticoagulants: Secondary | ICD-10-CM | POA: Diagnosis not present

## 2014-12-08 DIAGNOSIS — Z5181 Encounter for therapeutic drug level monitoring: Secondary | ICD-10-CM | POA: Diagnosis not present

## 2014-12-09 DIAGNOSIS — D120 Benign neoplasm of cecum: Secondary | ICD-10-CM | POA: Diagnosis not present

## 2014-12-09 DIAGNOSIS — Z86010 Personal history of colonic polyps: Secondary | ICD-10-CM | POA: Diagnosis not present

## 2014-12-09 DIAGNOSIS — D126 Benign neoplasm of colon, unspecified: Secondary | ICD-10-CM | POA: Diagnosis not present

## 2014-12-09 DIAGNOSIS — D124 Benign neoplasm of descending colon: Secondary | ICD-10-CM | POA: Diagnosis not present

## 2014-12-09 DIAGNOSIS — K5730 Diverticulosis of large intestine without perforation or abscess without bleeding: Secondary | ICD-10-CM | POA: Diagnosis not present

## 2014-12-09 DIAGNOSIS — K641 Second degree hemorrhoids: Secondary | ICD-10-CM | POA: Diagnosis not present

## 2014-12-14 ENCOUNTER — Emergency Department (HOSPITAL_COMMUNITY)
Admission: EM | Admit: 2014-12-14 | Discharge: 2014-12-14 | Disposition: A | Discharge status: Home / Self Care | Payer: Medicare Other | Attending: Emergency Medicine | Admitting: Emergency Medicine

## 2014-12-14 ENCOUNTER — Encounter (HOSPITAL_COMMUNITY): Payer: Self-pay

## 2014-12-14 DIAGNOSIS — Z86018 Personal history of other benign neoplasm: Secondary | ICD-10-CM | POA: Insufficient documentation

## 2014-12-14 DIAGNOSIS — Z9861 Coronary angioplasty status: Secondary | ICD-10-CM | POA: Insufficient documentation

## 2014-12-14 DIAGNOSIS — Z8619 Personal history of other infectious and parasitic diseases: Secondary | ICD-10-CM | POA: Insufficient documentation

## 2014-12-14 DIAGNOSIS — H6092 Unspecified otitis externa, left ear: Secondary | ICD-10-CM | POA: Insufficient documentation

## 2014-12-14 DIAGNOSIS — Z8739 Personal history of other diseases of the musculoskeletal system and connective tissue: Secondary | ICD-10-CM | POA: Diagnosis not present

## 2014-12-14 DIAGNOSIS — I2510 Atherosclerotic heart disease of native coronary artery without angina pectoris: Secondary | ICD-10-CM | POA: Insufficient documentation

## 2014-12-14 DIAGNOSIS — Z9889 Other specified postprocedural states: Secondary | ICD-10-CM | POA: Insufficient documentation

## 2014-12-14 DIAGNOSIS — Z79899 Other long term (current) drug therapy: Secondary | ICD-10-CM | POA: Insufficient documentation

## 2014-12-14 DIAGNOSIS — E119 Type 2 diabetes mellitus without complications: Secondary | ICD-10-CM | POA: Diagnosis not present

## 2014-12-14 DIAGNOSIS — Z794 Long term (current) use of insulin: Secondary | ICD-10-CM | POA: Diagnosis not present

## 2014-12-14 DIAGNOSIS — Z8719 Personal history of other diseases of the digestive system: Secondary | ICD-10-CM | POA: Diagnosis not present

## 2014-12-14 DIAGNOSIS — E780 Pure hypercholesterolemia: Secondary | ICD-10-CM | POA: Diagnosis not present

## 2014-12-14 DIAGNOSIS — Z87891 Personal history of nicotine dependence: Secondary | ICD-10-CM | POA: Diagnosis not present

## 2014-12-14 DIAGNOSIS — I509 Heart failure, unspecified: Secondary | ICD-10-CM | POA: Insufficient documentation

## 2014-12-14 DIAGNOSIS — I10 Essential (primary) hypertension: Secondary | ICD-10-CM | POA: Insufficient documentation

## 2014-12-14 DIAGNOSIS — H9202 Otalgia, left ear: Secondary | ICD-10-CM | POA: Diagnosis present

## 2014-12-14 DIAGNOSIS — Z86010 Personal history of colonic polyps: Secondary | ICD-10-CM | POA: Diagnosis not present

## 2014-12-14 DIAGNOSIS — Z7901 Long term (current) use of anticoagulants: Secondary | ICD-10-CM | POA: Diagnosis not present

## 2014-12-14 MED ORDER — HYDROCODONE-ACETAMINOPHEN 5-325 MG PO TABS
1.0000 | ORAL_TABLET | Freq: Once | ORAL | Status: AC
  Administered 2014-12-14: 1 via ORAL
  Filled 2014-12-14: qty 1

## 2014-12-14 MED ORDER — NEOMYCIN-COLIST-HC-THONZONIUM 3.3-3-10-0.5 MG/ML OT SUSP
3.0000 [drp] | OTIC | Status: AC
  Administered 2014-12-14: 3 [drp] via OTIC
  Filled 2014-12-14: qty 5

## 2014-12-14 MED ORDER — HYDROCODONE-ACETAMINOPHEN 5-325 MG PO TABS: 1.0000 | ORAL_TABLET | Freq: Four times a day (QID) | ORAL | Status: DC | PRN

## 2014-12-14 NOTE — ED Notes (Signed)
Patient reports left ear pain x 4 days.  Home remedies of peroxide and sweet oil unsuccessful.  Denies drainage or foreign body.

## 2014-12-14 NOTE — ED Provider Notes (Signed)
CSN: [id number]     Arrival date & time 12/14/14  0012 History   First MD Initiated Contact with Patient 12/14/14 0121     Chief Complaint  Patient presents with  . Otalgia     (Consider location/radiation/quality/duration/timing/severity/associated sxs/prior Treatment) Patient is a 59 y.o. male presenting with ear pain.  Otalgia Location:  Left Quality:  Aching Severity:  Moderate Onset quality:  Gradual Duration:  4 days Timing:  Constant Progression:  Worsening Chronicity:  New Relieved by:  Nothing Worsened by:  Palpation Ineffective treatments:  None tried Associated symptoms: no ear discharge, no fever and no hearing loss     Past Medical History  Diagnosis Date  . Hypertension   . Gout   . PAF (paroxysmal atrial fibrillation)     On coumadin  . Diabetes mellitus   . LIVER FUNCTION TESTS, ABNORMAL, HX OF 12/30/2006  . Rectal bleeding 12/18/2011    Scheduled for colonoscopy.    [name] HEPATITIS B, CHRONIC 12/30/2006  . OSTEOCHONDROMA 12/30/2006     LEFT SHOULDER   . HYPERCHOLESTEROLEMIA 07/11/2010  . MITRAL REGURGITATION 12/30/2006  . COLONIC POLYPS, HX OF 12/30/2006  . CAD (coronary artery disease), native coronary artery     a. Nonobstructive CAD by cath 2013 - diffuse distal and branch vessel CAD, no severe disease in the major coronaries, LV mild global hypokinesis, EF 45%. b. ETT-Sestamibi 5/14: EF 31%, small fixed inferior defect with no ischemia.  . Chronic CHF     a. Mixed ICM/NICM (?EtOH). EF 35% in 2008. Echo 5/13: EF 60-65%, mod LVH, EF 45% on V gram in 12/2011. EF 12/2012: EF 50-55%, mild LVH, inferobasal HK, mild MR. ETT-Ses 5/14 EF 41%. Cardiac MRI 5/14: EF 44%, mild global HK, subepicardial delayed enhancement in nonspecific RV insertion pattern.  . H/O atrial flutter 2007    a. Ablations in 2007, 2008.  [name] History of medication noncompliance   . History of alcohol abuse    Past Surgical History  Procedure Laterality Date  . Osteochondroma excision  1972  .  Coronary angioplasty  12/05/01  . A flutter ablation  2007, 2008    catheter ablation   . Eye surgery    . Left heart catheterization with coronary angiogram N/A 12/19/2011    Procedure: LEFT HEART CATHETERIZATION WITH CORONARY ANGIOGRAM;  Surgeon: Larey Dresser, MD;  Location: [facility] CATH LAB;  Service: Cardiovascular;  Laterality: N/A;   Family History  Problem Relation Age of Onset  . Diabetes Mother   . Hypertension Mother   . Heart attack Neg Hx   . Stroke Neg Hx    History  Substance Use Topics  . Smoking status: Former Smoker -- 0.20 packs/day for 18 years    Quit date: 08/14/1983  . Smokeless tobacco: Never Used  . Alcohol Use: 0.0 oz/week     Comment: 1-2 shots of liquor daily    Review of Systems  Constitutional: Negative for fever.  HENT: Positive for ear pain. Negative for ear discharge and hearing loss.       Allergies  Ace inhibitors and Other  Home Medications   Prior to Admission medications   Medication Sig Start Date End Date Taking? Authorizing Provider  amLODipine (NORVASC) 10 MG tablet Take 1 tablet (10 mg total) by mouth daily. 05/18/14   Tresa Garter, MD  atorvastatin (LIPITOR) 40 MG tablet Take 1 tablet (40 mg total) by mouth daily at 6 PM. 05/18/14   Tresa Garter, MD  carvedilol (COREG)  25 MG tablet Take 1 tablet (25 mg total) by mouth 2 (two) times daily with a meal. 05/18/14   Tresa Garter, MD  furosemide (LASIX) 40 MG tablet Take 1 tablet (40 mg total) by mouth daily. 05/18/14   Tresa Garter, MD  hydrALAZINE (APRESOLINE) 50 MG tablet 1 and 1/2 tablets (75mg ) every 8 hours Patient taking differently: 50 mg 2 (two) times daily. 50 mg twice daily 07/15/14   Larey Dresser, MD  HYDROcodone-acetaminophen (NORCO/VICODIN) 5-325 MG per tablet Take 1 tablet by mouth every 6 (six) hours as needed. 12/14/14   Junius Creamer, NP  insulin aspart (NOVOLOG) 100 UNIT/ML FlexPen Inject 7 Units into the skin 3 (three) times daily with meals. 05/18/14    Tresa Garter, MD  insulin glargine (LANTUS) 100 UNIT/ML injection Inject 0.6 mLs (60 Units total) into the skin at bedtime. 05/18/14   Tresa Garter, MD  isosorbide mononitrate (IMDUR) 60 MG 24 hr tablet Take 1 tablet (60 mg total) by mouth daily. 05/18/14   Tresa Garter, MD  losartan (COZAAR) 100 MG tablet Take 1 tablet (100 mg total) by mouth daily. 05/18/14   Tresa Garter, MD  metFORMIN (GLUCOPHAGE) 1000 MG tablet Take 1 tablet (1,000 mg total) by mouth 2 (two) times daily with a meal. 05/18/14   Tresa Garter, MD  Potassium Chloride ER 20 MEQ TBCR Take 0.5 tablets by mouth daily. 09/15/14   Liliane Shi, PA-C  spironolactone (ALDACTONE) 25 MG tablet Take 0.5 tablets (12.5 mg total) by mouth daily. 09/29/14   Thayer Headings, MD  warfarin (COUMADIN) 5 MG tablet Take 1 tablet (5 mg total) by mouth as directed. 05/18/14   Tresa Garter, MD   BP 140/78 mmHg  Pulse 79  Temp(Src) 98.5 F (36.9 C) (Oral)  Resp 18  Ht 5\' 7"  (1.702 m)  Wt 214 lb (97.07 kg)  BMI 33.51 kg/m2  SpO2 98% Physical Exam  ED Course  Procedures (including critical care time) Labs Review Labs Reviewed - No data to display  Imaging Review No results found.   EKG Interpretation None      MDM   Final diagnoses:  Otitis externa, left         Junius Creamer, NP 12/16/14 2320  April Palumbo, MD 12/18/14 2303

## 2014-12-14 NOTE — ED Provider Notes (Signed)
Medical screening examination/treatment/procedure(s) were performed by non-physician practitioner and as supervising physician I was immediately available for consultation/collaboration.   EKG Interpretation None       Tannis Burstein, MD 12/14/14 2305

## 2014-12-14 NOTE — ED Notes (Signed)
FNP at bedside.

## 2014-12-14 NOTE — Discharge Instructions (Signed)
You have been given drops to use in your ear 3-4 drops into the left ear every 6 hours for the next 5 days he also been given a referral to ENT for follow-up if needed

## 2014-12-27 ENCOUNTER — Ambulatory Visit (HOSPITAL_BASED_OUTPATIENT_CLINIC_OR_DEPARTMENT_OTHER): Payer: Medicare Other | Attending: Cardiology

## 2015-01-19 ENCOUNTER — Encounter: Payer: Self-pay | Admitting: Cardiology

## 2015-01-31 ENCOUNTER — Ambulatory Visit: Payer: Medicare Other | Admitting: Cardiology

## 2015-02-07 ENCOUNTER — Ambulatory Visit (INDEPENDENT_AMBULATORY_CARE_PROVIDER_SITE_OTHER): Payer: Medicare Other

## 2015-02-07 DIAGNOSIS — Z7901 Long term (current) use of anticoagulants: Secondary | ICD-10-CM | POA: Diagnosis not present

## 2015-02-07 DIAGNOSIS — I4891 Unspecified atrial fibrillation: Secondary | ICD-10-CM

## 2015-02-07 DIAGNOSIS — Z5181 Encounter for therapeutic drug level monitoring: Secondary | ICD-10-CM

## 2015-02-07 LAB — POCT INR: INR: 1.4

## 2015-02-16 ENCOUNTER — Ambulatory Visit (INDEPENDENT_AMBULATORY_CARE_PROVIDER_SITE_OTHER): Payer: Medicare Other | Admitting: *Deleted

## 2015-02-16 DIAGNOSIS — I4891 Unspecified atrial fibrillation: Secondary | ICD-10-CM | POA: Diagnosis not present

## 2015-02-16 DIAGNOSIS — Z7901 Long term (current) use of anticoagulants: Secondary | ICD-10-CM

## 2015-02-16 DIAGNOSIS — Z5181 Encounter for therapeutic drug level monitoring: Secondary | ICD-10-CM

## 2015-02-16 LAB — POCT INR: INR: 2

## 2015-03-10 ENCOUNTER — Ambulatory Visit (INDEPENDENT_AMBULATORY_CARE_PROVIDER_SITE_OTHER): Payer: Medicare Other | Admitting: *Deleted

## 2015-03-10 DIAGNOSIS — Z7901 Long term (current) use of anticoagulants: Secondary | ICD-10-CM | POA: Diagnosis not present

## 2015-03-10 DIAGNOSIS — I4891 Unspecified atrial fibrillation: Secondary | ICD-10-CM | POA: Diagnosis not present

## 2015-03-10 DIAGNOSIS — Z5181 Encounter for therapeutic drug level monitoring: Secondary | ICD-10-CM

## 2015-03-10 LAB — POCT INR: INR: 4.1

## 2015-03-24 ENCOUNTER — Ambulatory Visit (INDEPENDENT_AMBULATORY_CARE_PROVIDER_SITE_OTHER): Payer: Medicare Other | Admitting: *Deleted

## 2015-03-24 DIAGNOSIS — Z5181 Encounter for therapeutic drug level monitoring: Secondary | ICD-10-CM

## 2015-03-24 DIAGNOSIS — Z7901 Long term (current) use of anticoagulants: Secondary | ICD-10-CM | POA: Diagnosis not present

## 2015-03-24 DIAGNOSIS — I4891 Unspecified atrial fibrillation: Secondary | ICD-10-CM

## 2015-03-24 LAB — POCT INR: INR: 1.9

## 2015-04-22 ENCOUNTER — Encounter: Payer: Self-pay | Admitting: *Deleted

## 2015-04-22 ENCOUNTER — Ambulatory Visit (INDEPENDENT_AMBULATORY_CARE_PROVIDER_SITE_OTHER): Payer: Medicare Other | Admitting: Cardiology

## 2015-04-22 ENCOUNTER — Encounter: Payer: Self-pay | Admitting: Cardiology

## 2015-04-22 VITALS — BP 150/86 | HR 80 | Ht 72.0 in | Wt 213.8 lb

## 2015-04-22 DIAGNOSIS — I2510 Atherosclerotic heart disease of native coronary artery without angina pectoris: Secondary | ICD-10-CM | POA: Diagnosis not present

## 2015-04-22 DIAGNOSIS — I5032 Chronic diastolic (congestive) heart failure: Secondary | ICD-10-CM

## 2015-04-22 DIAGNOSIS — I5042 Chronic combined systolic (congestive) and diastolic (congestive) heart failure: Secondary | ICD-10-CM | POA: Diagnosis not present

## 2015-04-22 DIAGNOSIS — I480 Paroxysmal atrial fibrillation: Secondary | ICD-10-CM | POA: Diagnosis not present

## 2015-04-22 DIAGNOSIS — R0602 Shortness of breath: Secondary | ICD-10-CM | POA: Diagnosis not present

## 2015-04-22 LAB — BASIC METABOLIC PANEL
BUN: 12 mg/dL (ref 6–23)
CALCIUM: 9.4 mg/dL (ref 8.4–10.5)
CHLORIDE: 99 meq/L (ref 96–112)
CO2: 29 mEq/L (ref 19–32)
CREATININE: 1.03 mg/dL (ref 0.40–1.50)
GFR: 94.4 mL/min (ref >60.00)
Glucose, Bld: 194 mg/dL — ABNORMAL HIGH (ref 70–99)
Potassium: 3.5 mEq/L (ref 3.5–5.1)
Sodium: 135 mEq/L (ref 135–145)

## 2015-04-22 LAB — CBC WITH DIFFERENTIAL/PLATELET
Basophils Absolute: 0 10*3/uL (ref 0.0–0.1)
Basophils Relative: 0.5 % (ref 0.0–3.0)
EOS ABS: 0.3 10*3/uL (ref 0.0–0.7)
EOS PCT: 3.6 % (ref 0.0–5.0)
HCT: 44.8 % (ref 39.0–52.0)
Hemoglobin: 14.8 g/dL (ref 13.0–17.0)
LYMPHS ABS: 2 10*3/uL (ref 0.7–4.0)
Lymphocytes Relative: 25.1 % (ref 12.0–46.0)
MCHC: 32.9 g/dL (ref 30.0–36.0)
MCV: 85.3 fl (ref 78.0–100.0)
Monocytes Absolute: 0.8 10*3/uL (ref 0.1–1.0)
Monocytes Relative: 10.1 % (ref 3.0–12.0)
NEUTROS PCT: 60.7 % (ref 43.0–77.0)
Neutro Abs: 4.8 10*3/uL (ref 1.4–7.7)
Platelets: 168 10*3/uL (ref 150.0–400.0)
RBC: 5.26 Mil/uL (ref 4.22–5.81)
RDW: 15.3 % (ref 11.5–15.5)
WBC: 8 10*3/uL (ref 4.0–10.5)

## 2015-04-22 LAB — LIPID PANEL
CHOLESTEROL: 191 mg/dL (ref 0–200)
HDL: 39.8 mg/dL (ref >39.00)
NonHDL: 150.87
TRIGLYCERIDES: 333 mg/dL — AB (ref 0.0–149.0)
Total CHOL/HDL Ratio: 5
VLDL: 66.6 mg/dL — ABNORMAL HIGH (ref 0.0–40.0)

## 2015-04-22 LAB — LDL CHOLESTEROL, DIRECT: Direct LDL: 111 mg/dL

## 2015-04-22 LAB — BRAIN NATRIURETIC PEPTIDE: Pro B Natriuretic peptide (BNP): 59 pg/mL (ref 0.0–100.0)

## 2015-04-22 MED ORDER — SPIRONOLACTONE 25 MG PO TABS: 25.0000 mg | ORAL_TABLET | Freq: Every day | ORAL | Status: DC

## 2015-04-22 MED ORDER — HYDRALAZINE HCL 50 MG PO TABS: 50.0000 mg | ORAL_TABLET | Freq: Three times a day (TID) | ORAL | Status: DC

## 2015-04-22 MED ORDER — FUROSEMIDE 40 MG PO TABS: ORAL_TABLET | ORAL | Status: DC

## 2015-04-22 MED ORDER — POTASSIUM CHLORIDE CRYS ER 20 MEQ PO TBCR: 20.0000 meq | EXTENDED_RELEASE_TABLET | Freq: Every day | ORAL | Status: DC

## 2015-04-22 NOTE — Patient Instructions (Signed)
Medication Instructions:  Start spironolactone 25mg  daily.  Take hydralazine 50mg  three times a day.  Take lasix 40mg  two times a day for 4 days, then decrease to 40mg  in the AM and 20mg  (1/2 of a 40mg  tablet) in the PM.  Labwork: BMET/BNP/CBCd/Lipid profile today.  Your physician recommends that you return for lab work in: 2 weeks--BMET.   Testing/Procedures: Your physician has requested that you have a lexiscan myoview. For further information please visit [email or web address]. Please follow instruction sheet, as given.    Follow-Up: Your physician recommends that you schedule a follow-up appointment in: 3 weeks with Dr Aundra Dubin.

## 2015-04-24 DIAGNOSIS — I5032 Chronic diastolic (congestive) heart failure: Secondary | ICD-10-CM | POA: Insufficient documentation

## 2015-04-24 NOTE — Progress Notes (Signed)
Patient ID: [name] [name], male   DOB: 16-Feb-1956, 59 y.o.   MRN: [id number] PCP: Dr. Doreene Burke  59 yo with history of HTN, diabetes, paroxysmal atrial fibrillation, and CAD presents for cardiology followup.  He was admitted in 5/13 with chest pain with exertion.  LHC was done showing global HK with EF 45% and diffuse, severe distal and branch vessel disease.  There was no interventional option, but I suspect that this disease could be causing his anginal-type pain.  Echo at that time was read as showing EF 60-65%.  He was admitted in 5/14 with hypertensive urgency and chest pain.  He ruled out for MI and BP was controlled.  His EF was 50-55% by echo.  ETT-Sestamibi done as outpatient showed small fixed inferior defect with no ischemia but EF was 31%.  He was quite hypertensive during the study.  Cardiac MRI done to confirm EF in 5/14 showed EF 44%.  He had an episode of atrial fibrillation/RVR in 3/15 but went back into NSR.  Last echo in 2/16 showed EF 55-60% with moderate diastolic dysfunction.   No tachypalpitations.  He is in NSR today.  No exertional chest pain.  He is short of breath after walking short distances or with going up steps or inclines.  He has orthopnea and sleeps on several pillows.  He is out of spironolactone.  Main complaint currently is pain in back and hip radiating down the back of his leg.  This has gone on for about 3 days.   Labs (5/13): K 3.6, creatinine 0.93, LDL 85, HDL 57 Labs (7/13): K 3.9, creatinine 1.1, LDL 57, HDL 51  Labs (5/14): K 4, creatinine 1.25 Labs (8/14): K 3.7, creatinine 0.9, LDL 103, HDL 42 Labs (8/15): K 3.5 creatinine 1.0, BNP 75 Labs (2/16): K 3.8, creatinine 1.1  ECG: NSR, inferior TWIs  PMH: 1. DM2 2. Gout 3. HTN: Cough with ACEI.  4. Cardiomyopathy: Suspect mixed ischemic/nonischemic (?ETOH-related).  EF 35% in 2008.  Echo in 5/13 showed EF 60-65% with moderate LVH but EF was 45% on LV-gram in 5/13.  Echo (5/14) with EF 50-55%, mild LVH,  inferobasal HK, mild MR.  ETT-Sestamibi in 5/14, however, showed EF 31%.  Cardiac MRI (6/14) with EF 44%, mild global hypokinesis, subepicardial delayed enhancement in a nonspecific RV insertion site pattern.  Echo (2/16) with EF 55-60%, moderate diastolic dysfunction.  5. CAD: LHC in 2010 with mild nonobstructive disease.  LHC (5/13) with diffuse distal and branch vessel disease, mild global hypokinesis and EF 45%.  ETT-Sestamibi (5/14): EF 31%, small fixed inferior defect with no ischemia.  6. H/o chronic HBV 7. Osteochondroma left shoulder.  8. Hyperlipidemia 9. Paroxysmal atrial fibrillation: Coumadin.  Developed cough and increased ESR with amiodarone.  10. Atrial flutter: had ablations in 2007 and 2008.  11. OSA on CPAP.   SH: Married, prior smoker.  Some ETOH, occasionally heavy in past.  Does use occasional marijuana. Out of work Engineer, drilling. 2 daughters in grad school.   FH: CAD  ROS: All systems reviewed and negative except as per HPI.   Current Outpatient Prescriptions  Medication Sig Dispense Refill  . amLODipine (NORVASC) 10 MG tablet Take 1 tablet (10 mg total) by mouth daily. 90 tablet 3  . atorvastatin (LIPITOR) 40 MG tablet Take 1 tablet (40 mg total) by mouth daily at 6 PM. 90 tablet 3  . carvedilol (COREG) 25 MG tablet Take 1 tablet (25 mg total) by mouth 2 (two) times daily with a  meal. 180 tablet 3  . HYDROcodone-acetaminophen (NORCO/VICODIN) 5-325 MG per tablet Take 1 tablet by mouth every 6 (six) hours as needed. (Patient taking differently: Take 1 tablet by mouth every 6 (six) hours as needed (pain). ) 4 tablet 0  . insulin aspart (NOVOLOG) 100 UNIT/ML FlexPen Inject 7 Units into the skin 3 (three) times daily with meals. 15 mL 3  . insulin glargine (LANTUS) 100 UNIT/ML injection Inject 0.6 mLs (60 Units total) into the skin at bedtime. 10 mL 11  . isosorbide mononitrate (IMDUR) 60 MG 24 hr tablet Take 1 tablet (60 mg total) by mouth daily. 90 tablet 3  . losartan  (COZAAR) 100 MG tablet Take 1 tablet (100 mg total) by mouth daily. 90 tablet 3  . metFORMIN (GLUCOPHAGE) 1000 MG tablet Take 1 tablet (1,000 mg total) by mouth 2 (two) times daily with a meal. 180 tablet 3  . warfarin (COUMADIN) 5 MG tablet Take 1 tablet (5 mg total) by mouth as directed. 90 tablet 3  . furosemide (LASIX) 40 MG tablet Take 1 tablet by mouth in the AM and 1/2 tablet (60m) by mouth in the PM 45 tablet 1  . hydrALAZINE (APRESOLINE) 50 MG tablet Take 1 tablet (50 mg total) by mouth 3 (three) times daily. 90 tablet 6  . potassium chloride SA (KLOR-CON M20) 20 MEQ tablet Take 1 tablet (20 mEq total) by mouth daily. 30 tablet 3  . spironolactone (ALDACTONE) 25 MG tablet Take 1 tablet (25 mg total) by mouth daily. 30 tablet 1   No current facility-administered medications for this visit.    BP 150/86 mmHg  Pulse 80  Ht 6' (1.829 m)  Wt 213 lb 12.8 oz (96.979 kg)  BMI 28.99 kg/m2 General: NAD Neck: JVP 8 cm with HJR, no thyromegaly or thyroid nodule.  Lungs: Clear to auscultation bilaterally with normal respiratory effort. CV: Nondisplaced PMI.  Heart regular S1/S2, no S3/S4, no murmur.  No peripheral edema.  No carotid bruit.  Normal pedal pulses.  Abdomen: Soft, nontender, no hepatosplenomegaly, no distention.  Neurologic: Alert and oriented x 3.  Psych: Normal affect. Extremities: No clubbing or cyanosis.   Assessment/Plan:  Atrial fibrillation Paroxysmal atrial fibrillation, continue coumadin. He is in NSR today.  Coronary artery disease Diffuse distal and branch vessel disease not amenable to intervention on cath in 5/13. Last ETT-Sestamibi (5/14) with small inferior fixed defect, no ischemia.  He has not had chest pain but does have significant exertional dyspnea.  - I will arrange for Lexiscan Cardiolite to assess for ischemia.  - He is on warfarin so does not have to take ASA given stable disease.  Check CBC.  - Continue statin.  HYPERCHOLESTEROLEMIA  Continue  atorvastatin, check lipids.    Cardiomyopathy  EF 47% on cardiac MRI in 6/14 but EF improved back to 55-60% on 2/16 echo.  Suspect cardiomyopathy was a mixed ischemic/nonischemic (?ETOH + HTN) cardiomyopathy.  He does appear to have some volume overload on exam with NYHA class III symptoms.  - Continue current Coreg and losartan. - He needs to restart spironolactone for BP control.  He also needs to increase hydralazine to 50 mg tid rather than bid for BP control.  He is on Imdur.  - Avoid ETOH. - Increase Lasix to 40 mg bid x 4 days, then 40 qam/20 qpm. BMET/BNP today and again in 2 wks.  Will add KCl 20 daily.  Hypertension BP high today.  As above, increase hydralazine to 50 mg tid.  I will also restart spironolactone 25 mg daily with BMET in 2 wks.  OSA Continue CPAP.   Loralie Champagne 04/24/2015

## 2015-04-26 ENCOUNTER — Telehealth (HOSPITAL_COMMUNITY): Payer: Self-pay

## 2015-04-26 NOTE — Telephone Encounter (Signed)
Patient given detailed instructions per Myocardial Perfusion Study Information Sheet for test on 04-28-2015 at 1145. Patient notified to arrive 15 minutes early and that it is imperative to arrive on time for appointment to keep from having the test rescheduled.  If you need to cancel or reschedule your appointment, please call the office within 24 hours of your appointment. Failure to do so may result in a cancellation of your appointment, and a $50 no show fee. Patient verbalized understanding. Oletta Lamas, Fumi Guadron A

## 2015-04-28 ENCOUNTER — Ambulatory Visit (HOSPITAL_COMMUNITY): Payer: Medicare Other | Attending: Cardiology

## 2015-04-28 DIAGNOSIS — I5032 Chronic diastolic (congestive) heart failure: Secondary | ICD-10-CM

## 2015-04-28 DIAGNOSIS — I5042 Chronic combined systolic (congestive) and diastolic (congestive) heart failure: Secondary | ICD-10-CM | POA: Diagnosis not present

## 2015-04-28 DIAGNOSIS — I2510 Atherosclerotic heart disease of native coronary artery without angina pectoris: Secondary | ICD-10-CM | POA: Diagnosis not present

## 2015-04-28 DIAGNOSIS — I517 Cardiomegaly: Secondary | ICD-10-CM | POA: Insufficient documentation

## 2015-04-28 DIAGNOSIS — R9439 Abnormal result of other cardiovascular function study: Secondary | ICD-10-CM | POA: Insufficient documentation

## 2015-04-28 DIAGNOSIS — R0602 Shortness of breath: Secondary | ICD-10-CM

## 2015-04-28 DIAGNOSIS — E119 Type 2 diabetes mellitus without complications: Secondary | ICD-10-CM | POA: Diagnosis not present

## 2015-04-28 DIAGNOSIS — I10 Essential (primary) hypertension: Secondary | ICD-10-CM | POA: Diagnosis not present

## 2015-04-28 DIAGNOSIS — I480 Paroxysmal atrial fibrillation: Secondary | ICD-10-CM

## 2015-04-28 LAB — MYOCARDIAL PERFUSION IMAGING
CHL CUP RESTING HR STRESS: 77 {beats}/min
CSEPPHR: 92 {beats}/min
LHR: 0.37
LVDIAVOL: 209 mL
LVSYSVOL: 155 mL
SDS: 2
SRS: 4
SSS: 6
TID: 0.93

## 2015-04-28 MED ORDER — TECHNETIUM TC 99M SESTAMIBI GENERIC - CARDIOLITE
30.6000 | Freq: Once | INTRAVENOUS | Status: AC | PRN
  Administered 2015-04-28: 31 via INTRAVENOUS

## 2015-04-28 MED ORDER — REGADENOSON 0.4 MG/5ML IV SOLN
0.4000 mg | Freq: Once | INTRAVENOUS | Status: AC
  Administered 2015-04-28: 0.4 mg via INTRAVENOUS

## 2015-04-28 MED ORDER — TECHNETIUM TC 99M SESTAMIBI GENERIC - CARDIOLITE
10.7000 | Freq: Once | INTRAVENOUS | Status: AC | PRN
  Administered 2015-04-28: 11 via INTRAVENOUS

## 2015-04-29 ENCOUNTER — Other Ambulatory Visit: Payer: Self-pay | Admitting: *Deleted

## 2015-04-29 ENCOUNTER — Ambulatory Visit (INDEPENDENT_AMBULATORY_CARE_PROVIDER_SITE_OTHER): Payer: Medicare Other | Admitting: Cardiology

## 2015-04-29 ENCOUNTER — Encounter: Payer: Self-pay | Admitting: *Deleted

## 2015-04-29 ENCOUNTER — Encounter: Payer: Self-pay | Admitting: Cardiology

## 2015-04-29 VITALS — BP 140/90 | HR 73 | Ht 72.0 in | Wt 207.1 lb

## 2015-04-29 DIAGNOSIS — I5042 Chronic combined systolic (congestive) and diastolic (congestive) heart failure: Secondary | ICD-10-CM

## 2015-04-29 DIAGNOSIS — I480 Paroxysmal atrial fibrillation: Secondary | ICD-10-CM | POA: Diagnosis not present

## 2015-04-29 DIAGNOSIS — I25118 Atherosclerotic heart disease of native coronary artery with other forms of angina pectoris: Secondary | ICD-10-CM | POA: Diagnosis not present

## 2015-04-29 DIAGNOSIS — I5032 Chronic diastolic (congestive) heart failure: Secondary | ICD-10-CM | POA: Diagnosis not present

## 2015-04-29 DIAGNOSIS — R9439 Abnormal result of other cardiovascular function study: Secondary | ICD-10-CM

## 2015-04-29 LAB — PROTIME-INR
INR: 1.94 — AB (ref <1.50)
Prothrombin Time: 22.4 seconds — ABNORMAL HIGH (ref 11.6–15.2)

## 2015-04-29 LAB — CBC WITH DIFFERENTIAL/PLATELET
BASOS ABS: 0.1 10*3/uL (ref 0.0–0.1)
Basophils Relative: 1 % (ref 0–1)
EOS ABS: 0.2 10*3/uL (ref 0.0–0.7)
EOS PCT: 3 % (ref 0–5)
HCT: 44.1 % (ref 39.0–52.0)
Hemoglobin: 15.4 g/dL (ref 13.0–17.0)
Lymphocytes Relative: 33 % (ref 12–46)
Lymphs Abs: 2.2 10*3/uL (ref 0.7–4.0)
MCH: 28.9 pg (ref 26.0–34.0)
MCHC: 34.9 g/dL (ref 30.0–36.0)
MCV: 82.9 fL (ref 78.0–100.0)
MONO ABS: 0.7 10*3/uL (ref 0.1–1.0)
MPV: 9.6 fL (ref 8.6–12.4)
Monocytes Relative: 10 % (ref 3–12)
Neutro Abs: 3.5 10*3/uL (ref 1.7–7.7)
Neutrophils Relative %: 53 % (ref 43–77)
PLATELETS: 189 10*3/uL (ref 150–400)
RBC: 5.32 MIL/uL (ref 4.22–5.81)
RDW: 15.2 % (ref 11.5–15.5)
WBC: 6.6 10*3/uL (ref 4.0–10.5)

## 2015-04-29 LAB — BASIC METABOLIC PANEL
BUN: 13 mg/dL (ref 7–25)
CALCIUM: 9.5 mg/dL (ref 8.6–10.3)
CO2: 22 mmol/L (ref 20–31)
CREATININE: 1.08 mg/dL (ref 0.70–1.25)
Chloride: 106 mmol/L (ref 98–110)
Glucose, Bld: 160 mg/dL — ABNORMAL HIGH (ref 65–99)
Potassium: 3.7 mmol/L (ref 3.5–5.3)
Sodium: 140 mmol/L (ref 135–146)

## 2015-04-29 MED ORDER — ATORVASTATIN CALCIUM 80 MG PO TABS: 80.0000 mg | ORAL_TABLET | Freq: Every day | ORAL | Status: DC

## 2015-04-29 NOTE — Patient Instructions (Signed)
Medication Instructions:  Increase atorvastatin to 80 mg daily. You can take 2 of your 40mg  tablets daily at the same time and use your current supply.  Labwork: BMET/CBCd/PT/INR today  Testing/Procedures: Your physician has requested that you have an echocardiogram. Echocardiography is a painless test that uses sound waves to create images of your heart. It provides your doctor with information about the size and shape of your heart and how well your heart's chambers and valves are working. This procedure takes approximately one hour. There are no restrictions for this procedure. Monday September 19,2016 at 9:30AM.     Follow-Up:  Your physician recommends that you schedule a follow-up appointment on September 28,2016 at 2:15PM  Dr Aundra Dubin    Any Other Special Instructions Will Be Listed Below (If Applicable). Your physician has requested that you have a cardiac catheterization. Cardiac catheterization is used to diagnose and/or treat various heart conditions. Doctors may recommend this procedure for a number of different reasons. The most common reason is to evaluate chest pain. Chest pain can be a symptom of coronary artery disease (CAD), and cardiac catheterization can show whether plaque is narrowing or blocking your heart's arteries. This procedure is also used to evaluate the valves, as well as measure the blood flow and oxygen levels in different parts of your heart. For further information please visit [email or web address]. Please follow instruction sheet, as given. Tuesday September 20,2016

## 2015-05-01 NOTE — Progress Notes (Signed)
Patient ID: [name] [name], male   DOB: 01/06/1956, 59 y.o.   MRN: [id number] PCP: Dr. Jegede  59 yo with history of HTN, diabetes, paroxysmal atrial fibrillation, and CAD presents for cardiology followup.  He was admitted in 5/13 with chest pain with exertion.  LHC was done showing global HK with EF 45% and diffuse, severe distal and branch vessel disease.  There was no interventional option, but I suspect that this disease could be causing his anginal-type pain.  Echo at that time was read as showing EF 60-65%.  He was admitted in 5/14 with hypertensive urgency and chest pain.  He ruled out for MI and BP was controlled.  His EF was 50-55% by echo.  ETT-Sestamibi done as outpatient showed small fixed inferior defect with no ischemia but EF was 31%.  He was quite hypertensive during the study.  Cardiac MRI done to confirm EF in 5/14 showed EF 44%.  He had an episode of atrial fibrillation/RVR in 3/15 but went back into NSR.  Last echo in 2/16 showed EF 55-60% with moderate diastolic dysfunction.   No tachypalpitations.  He is in NSR today.  No exertional chest pain.  He is short of breath after walking short distances or with going up steps or inclines.  He has orthopnea and sleeps on several pillows.  He is not taking spironolactone yet and has been out of losartan for a couple of days.  BP is borderline high today.  I had him do a Lexiscan Cardiolite last week => this was a high risk study with EF 26% and inferior ischemia.    At last appointment, I increased his Lasix and weight is down 6 lbs but exertional symptoms are unchanged.   Labs (5/13): K 3.6, creatinine 0.93, LDL 85, HDL 57 Labs (7/13): K 3.9, creatinine 1.1, LDL 57, HDL 51  Labs (5/14): K 4, creatinine 1.25 Labs (8/14): K 3.7, creatinine 0.9, LDL 103, HDL 42 Labs (8/15): K 3.5 creatinine 1.0, BNP 75 Labs (2/16): K 3.8, creatinine 1.1 Labs (9/16): K 3.5, creatinine 1.03, BNP 59, HDL 40, LDL 111, TGs 333  PMH: 1. DM2 2. Gout 3. HTN:  Cough with ACEI.  4. Cardiomyopathy: Suspect mixed ischemic/nonischemic (?ETOH-related).  EF 35% in 2008.  Echo in 5/13 showed EF 60-65% with moderate LVH but EF was 45% on LV-gram in 5/13.  Echo (5/14) with EF 50-55%, mild LVH, inferobasal HK, mild MR.  ETT-Sestamibi in 5/14, however, showed EF 31%.  Cardiac MRI (6/14) with EF 44%, mild global hypokinesis, subepicardial delayed enhancement in a nonspecific RV insertion site pattern.  Echo (2/16) with EF 55-60%, moderate diastolic dysfunction.  5. CAD: LHC in 2010 with mild nonobstructive disease.  LHC (5/13) with diffuse distal and branch vessel disease, mild global hypokinesis and EF 45%.  ETT-Sestamibi (5/14): EF 31%, small fixed inferior defect with no ischemia.  Lexiscan Cardiolite (9/16) with EF 26%, moderate inferior defect that was partially reversible, suggesting ischemia => High risk study.  6. H/o chronic HBV 7. Osteochondroma left shoulder.  8. Hyperlipidemia 9. Paroxysmal atrial fibrillation: Coumadin.  Developed cough and increased ESR with amiodarone.  10. Atrial flutter: had ablations in 2007 and 2008.  11. OSA on CPAP.   SH: Married, prior smoker.  Some ETOH, occasionally heavy in past.  Does use occasional marijuana. 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   FH: CAD  ROS: All systems reviewed and negative except as per HPI.   Current Outpatient Prescriptions  Medication   Sig Dispense Refill  . amLODipine (NORVASC) 10 MG tablet Take 1 tablet (10 mg total) by mouth daily. 90 tablet 3  . carvedilol (COREG) 25 MG tablet Take 1 tablet (25 mg total) by mouth 2 (two) times daily with a meal. 180 tablet 3  . furosemide (LASIX) 40 MG tablet Take 1 tablet by mouth in the AM and 1/2 tablet (20mg) by mouth in the PM 45 tablet 1  . hydrALAZINE (APRESOLINE) 50 MG tablet Take 1 tablet (50 mg total) by mouth 3 (three) times daily. 90 tablet 6  . HYDROcodone-acetaminophen (NORCO/VICODIN) 5-325 MG per tablet Take 1 tablet by mouth  every 6 (six) hours as needed. (Patient taking differently: Take 1 tablet by mouth every 6 (six) hours as needed (pain). ) 4 tablet 0  . insulin aspart (NOVOLOG) 100 UNIT/ML FlexPen Inject 7 Units into the skin 3 (three) times daily with meals. 15 mL 3  . insulin glargine (LANTUS) 100 UNIT/ML injection Inject 0.6 mLs (60 Units total) into the skin at bedtime. 10 mL 11  . isosorbide mononitrate (IMDUR) 60 MG 24 hr tablet Take 1 tablet (60 mg total) by mouth daily. 90 tablet 3  . losartan (COZAAR) 100 MG tablet Take 1 tablet (100 mg total) by mouth daily. 90 tablet 3  . metFORMIN (GLUCOPHAGE) 1000 MG tablet Take 1 tablet (1,000 mg total) by mouth 2 (two) times daily with a meal. 180 tablet 3  . potassium chloride SA (KLOR-CON M20) 20 MEQ tablet Take 1 tablet (20 mEq total) by mouth daily. 30 tablet 3  . spironolactone (ALDACTONE) 25 MG tablet Take 1 tablet (25 mg total) by mouth daily. 30 tablet 1  . warfarin (COUMADIN) 5 MG tablet Take 1 tablet (5 mg total) by mouth as directed. 90 tablet 3  . atorvastatin (LIPITOR) 80 MG tablet Take 1 tablet (80 mg total) by mouth daily. 90 tablet 3   No current facility-administered medications for this visit.    BP 140/90 mmHg  Pulse 73  Ht 6' (1.829 m)  Wt 207 lb 1.9 oz (93.949 kg)  BMI 28.08 kg/m2 General: NAD Neck: JVP 7 cm, no thyromegaly or thyroid nodule.  Lungs: Clear to auscultation bilaterally with normal respiratory effort. CV: Nondisplaced PMI.  Heart regular S1/S2, no S3/S4, no murmur.  No peripheral edema.  No carotid bruit.  Normal pedal pulses.  Abdomen: Soft, nontender, no hepatosplenomegaly, no distention.  Neurologic: Alert and oriented x 3.  Psych: Normal affect. Extremities: No clubbing or cyanosis.   Assessment/Plan:  Atrial fibrillation Paroxysmal atrial fibrillation, continue coumadin. He is in NSR today.  Coronary artery disease Diffuse distal and branch vessel disease not amenable to intervention on cath in 5/13. Last  ETT-Sestamibi (5/14) with small inferior fixed defect, no ischemia.  He has not had chest pain but does have significant exertional dyspnea. Lexiscan Cardiolite last week was a high risk study with EF 26% and significant ischemia. - He will need RHC/LHC next week.  We discussed the risks/benefits of the procedure and he agrees to proceed.  - He is on warfarin so does not have to take ASA given stable disease.  Will need to hold warfarin the three days prior to cath.  - Continue statin.  HYPERCHOLESTEROLEMIA  LDL and TGs too high, will increase atorvastatin to 80 mg daily.     Cardiomyopathy  EF 47% on cardiac MRI in 6/14 but EF improved back to 55-60% on 2/16 echo.  However, EF down to 26% on Cardiolite last   week.  At last visit, he was volume overloaded and I increased Lasix.  Weight is down but exertional symptoms continue unchanged.   - Continue current Coreg and losartan. - He needs to restart spironolactone for BP control.  BMET 2 wks after starting spironolactone. - Continue hydralazine 50 mg tid and Imdur 60 daily.  - Avoid ETOH. - Continue Lasix 40 qam/20 qpm.  BMET today.  - Echo to confirm EF.    Hypertension BP high today.  Poor compliance plays a role in this => he is out of losartan and has not started spironolactone.   OSA Continue CPAP.   Dalton McLean 05/01/2015    

## 2015-05-02 ENCOUNTER — Other Ambulatory Visit: Payer: Self-pay

## 2015-05-02 ENCOUNTER — Ambulatory Visit (HOSPITAL_COMMUNITY): Payer: Medicare Other | Attending: Cardiology

## 2015-05-02 DIAGNOSIS — I351 Nonrheumatic aortic (valve) insufficiency: Secondary | ICD-10-CM | POA: Insufficient documentation

## 2015-05-02 DIAGNOSIS — I517 Cardiomegaly: Secondary | ICD-10-CM | POA: Insufficient documentation

## 2015-05-02 DIAGNOSIS — I509 Heart failure, unspecified: Secondary | ICD-10-CM | POA: Insufficient documentation

## 2015-05-02 DIAGNOSIS — E119 Type 2 diabetes mellitus without complications: Secondary | ICD-10-CM | POA: Diagnosis not present

## 2015-05-02 DIAGNOSIS — E785 Hyperlipidemia, unspecified: Secondary | ICD-10-CM | POA: Diagnosis not present

## 2015-05-02 DIAGNOSIS — I340 Nonrheumatic mitral (valve) insufficiency: Secondary | ICD-10-CM | POA: Insufficient documentation

## 2015-05-02 DIAGNOSIS — I071 Rheumatic tricuspid insufficiency: Secondary | ICD-10-CM | POA: Insufficient documentation

## 2015-05-02 DIAGNOSIS — R9439 Abnormal result of other cardiovascular function study: Secondary | ICD-10-CM

## 2015-05-02 DIAGNOSIS — I5042 Chronic combined systolic (congestive) and diastolic (congestive) heart failure: Secondary | ICD-10-CM | POA: Diagnosis not present

## 2015-05-02 DIAGNOSIS — I10 Essential (primary) hypertension: Secondary | ICD-10-CM | POA: Diagnosis not present

## 2015-05-03 ENCOUNTER — Other Ambulatory Visit: Payer: Self-pay

## 2015-05-03 ENCOUNTER — Ambulatory Visit (HOSPITAL_COMMUNITY)
Admission: RE | Admit: 2015-05-03 | Discharge: 2015-05-04 | Disposition: A | Discharge status: Home / Self Care | Payer: Medicare Other | Source: Ambulatory Visit | Attending: Cardiology | Admitting: Cardiology

## 2015-05-03 ENCOUNTER — Encounter (HOSPITAL_COMMUNITY): Payer: Self-pay | Admitting: Cardiology

## 2015-05-03 ENCOUNTER — Encounter (HOSPITAL_COMMUNITY)
Admission: RE | Disposition: A | Discharge status: Home / Self Care | Payer: Medicare Other | Source: Ambulatory Visit | Attending: Cardiology

## 2015-05-03 DIAGNOSIS — I429 Cardiomyopathy, unspecified: Secondary | ICD-10-CM | POA: Diagnosis not present

## 2015-05-03 DIAGNOSIS — E119 Type 2 diabetes mellitus without complications: Secondary | ICD-10-CM | POA: Diagnosis not present

## 2015-05-03 DIAGNOSIS — G4733 Obstructive sleep apnea (adult) (pediatric): Secondary | ICD-10-CM | POA: Diagnosis not present

## 2015-05-03 DIAGNOSIS — E780 Pure hypercholesterolemia: Secondary | ICD-10-CM | POA: Insufficient documentation

## 2015-05-03 DIAGNOSIS — E1165 Type 2 diabetes mellitus with hyperglycemia: Secondary | ICD-10-CM | POA: Diagnosis not present

## 2015-05-03 DIAGNOSIS — I2510 Atherosclerotic heart disease of native coronary artery without angina pectoris: Secondary | ICD-10-CM | POA: Insufficient documentation

## 2015-05-03 DIAGNOSIS — F1290 Cannabis use, unspecified, uncomplicated: Secondary | ICD-10-CM | POA: Insufficient documentation

## 2015-05-03 DIAGNOSIS — Z87891 Personal history of nicotine dependence: Secondary | ICD-10-CM | POA: Insufficient documentation

## 2015-05-03 DIAGNOSIS — I480 Paroxysmal atrial fibrillation: Secondary | ICD-10-CM | POA: Diagnosis not present

## 2015-05-03 DIAGNOSIS — Z7901 Long term (current) use of anticoagulants: Secondary | ICD-10-CM | POA: Diagnosis not present

## 2015-05-03 DIAGNOSIS — Z955 Presence of coronary angioplasty implant and graft: Secondary | ICD-10-CM | POA: Diagnosis not present

## 2015-05-03 DIAGNOSIS — Z794 Long term (current) use of insulin: Secondary | ICD-10-CM | POA: Insufficient documentation

## 2015-05-03 DIAGNOSIS — Z23 Encounter for immunization: Secondary | ICD-10-CM | POA: Insufficient documentation

## 2015-05-03 DIAGNOSIS — I10 Essential (primary) hypertension: Secondary | ICD-10-CM | POA: Diagnosis not present

## 2015-05-03 DIAGNOSIS — Z8249 Family history of ischemic heart disease and other diseases of the circulatory system: Secondary | ICD-10-CM | POA: Diagnosis not present

## 2015-05-03 HISTORY — DX: Anxiety disorder, unspecified: F41.9

## 2015-05-03 HISTORY — DX: Sciatica, left side: M54.32

## 2015-05-03 HISTORY — DX: Sleep apnea, unspecified: G47.30

## 2015-05-03 HISTORY — DX: Angina pectoris, unspecified: I20.9

## 2015-05-03 HISTORY — DX: Malignant neoplasm of bone and articular cartilage, unspecified: C41.9

## 2015-05-03 HISTORY — PX: CARDIAC CATHETERIZATION: SHX172

## 2015-05-03 HISTORY — DX: Type 2 diabetes mellitus without complications: E11.9

## 2015-05-03 LAB — POCT I-STAT 3, VENOUS BLOOD GAS (G3P V)
ACID-BASE DEFICIT: 1 mmol/L (ref 0.0–2.0)
ACID-BASE DEFICIT: 1 mmol/L (ref 0.0–2.0)
BICARBONATE: 23.2 meq/L (ref 20.0–24.0)
Bicarbonate: 23.6 mEq/L (ref 20.0–24.0)
O2 Saturation: 68 %
O2 Saturation: 70 %
PH VEN: 7.376 — AB (ref 7.250–7.300)
PH VEN: 7.396 — AB (ref 7.250–7.300)
TCO2: 25 mmol/L (ref 0–100)
TCO2: 24 mmol/L (ref 0–100)
pCO2, Ven: 37.8 mmHg — ABNORMAL LOW (ref 45.0–50.0)
pCO2, Ven: 40.3 mmHg — ABNORMAL LOW (ref 45.0–50.0)
pO2, Ven: 36 mmHg (ref 30.0–45.0)
pO2, Ven: 37 mmHg (ref 30.0–45.0)

## 2015-05-03 LAB — GLUCOSE, CAPILLARY
GLUCOSE-CAPILLARY: 147 mg/dL — AB (ref 65–99)
GLUCOSE-CAPILLARY: 175 mg/dL — AB (ref 65–99)
Glucose-Capillary: 168 mg/dL — ABNORMAL HIGH (ref 65–99)
Glucose-Capillary: 163 mg/dL — ABNORMAL HIGH (ref 65–99)

## 2015-05-03 LAB — PROTIME-INR
INR: 1.81 — AB (ref 0.00–1.49)
PROTHROMBIN TIME: 20.9 s — AB (ref 11.6–15.2)

## 2015-05-03 LAB — POCT ACTIVATED CLOTTING TIME: Activated Clotting Time: 540 seconds

## 2015-05-03 SURGERY — RIGHT/LEFT HEART CATH AND CORONARY ANGIOGRAPHY
Anesthesia: LOCAL

## 2015-05-03 MED ORDER — MIDAZOLAM HCL 2 MG/2ML IJ SOLN
INTRAMUSCULAR | Status: AC
  Filled 2015-05-03: qty 4

## 2015-05-03 MED ORDER — CLOPIDOGREL BISULFATE 300 MG PO TABS
ORAL_TABLET | ORAL | Status: DC | PRN
  Administered 2015-05-03: 600 mg via ORAL

## 2015-05-03 MED ORDER — IOHEXOL 350 MG/ML SOLN
INTRAVENOUS | Status: DC | PRN
  Administered 2015-05-03: 250 mL via INTRA_ARTERIAL

## 2015-05-03 MED ORDER — SODIUM CHLORIDE 0.9 % WEIGHT BASED INFUSION
3.0000 mL/kg/h | INTRAVENOUS | Status: DC
Start: 2015-05-04 — End: 2015-05-03
  Administered 2015-05-03: 3 mL/kg/h via INTRAVENOUS

## 2015-05-03 MED ORDER — BIVALIRUDIN 250 MG IV SOLR
INTRAVENOUS | Status: AC
  Filled 2015-05-03: qty 250

## 2015-05-03 MED ORDER — SODIUM CHLORIDE 0.9 % WEIGHT BASED INFUSION: 1.0000 mL/kg/h | INTRAVENOUS | Status: DC

## 2015-05-03 MED ORDER — MIDAZOLAM HCL 2 MG/2ML IJ SOLN
INTRAMUSCULAR | Status: DC | PRN
  Administered 2015-05-03: 1 mg via INTRAVENOUS

## 2015-05-03 MED ORDER — POTASSIUM CHLORIDE CRYS ER 20 MEQ PO TBCR
20.0000 meq | EXTENDED_RELEASE_TABLET | Freq: Every day | ORAL | Status: DC
  Administered 2015-05-03 – 2015-05-04 (×2): 20 meq via ORAL
  Filled 2015-05-03 (×2): qty 1

## 2015-05-03 MED ORDER — VERAPAMIL HCL 2.5 MG/ML IV SOLN
INTRAVENOUS | Status: AC
  Filled 2015-05-03: qty 2

## 2015-05-03 MED ORDER — HEPARIN (PORCINE) IN NACL 2-0.9 UNIT/ML-% IJ SOLN
INTRAMUSCULAR | Status: AC
  Filled 2015-05-03: qty 1500

## 2015-05-03 MED ORDER — WARFARIN SODIUM 5 MG PO TABS
5.0000 mg | ORAL_TABLET | Freq: Every day | ORAL | Status: DC
  Administered 2015-05-03: 5 mg via ORAL
  Filled 2015-05-03 (×2): qty 1

## 2015-05-03 MED ORDER — FENTANYL CITRATE (PF) 100 MCG/2ML IJ SOLN
INTRAMUSCULAR | Status: DC | PRN
  Administered 2015-05-03 (×2): 25 ug via INTRAVENOUS

## 2015-05-03 MED ORDER — ASPIRIN 81 MG PO CHEW
81.0000 mg | CHEWABLE_TABLET | Freq: Every day | ORAL | Status: DC
  Administered 2015-05-04: 10:00:00 81 mg via ORAL
  Filled 2015-05-03: qty 1

## 2015-05-03 MED ORDER — SODIUM CHLORIDE 0.9 % IV SOLN: 250.0000 mL | INTRAVENOUS | Status: DC | PRN

## 2015-05-03 MED ORDER — VERAPAMIL HCL 2.5 MG/ML IV SOLN
INTRAVENOUS | Status: DC | PRN
  Administered 2015-05-03: 08:00:00 via INTRA_ARTERIAL

## 2015-05-03 MED ORDER — FENTANYL CITRATE (PF) 100 MCG/2ML IJ SOLN
INTRAMUSCULAR | Status: AC
  Filled 2015-05-03: qty 4

## 2015-05-03 MED ORDER — SODIUM CHLORIDE 0.9 % IJ SOLN: 3.0000 mL | INTRAMUSCULAR | Status: DC | PRN

## 2015-05-03 MED ORDER — LOSARTAN POTASSIUM 50 MG PO TABS
100.0000 mg | ORAL_TABLET | Freq: Every day | ORAL | Status: DC
  Administered 2015-05-03 – 2015-05-04 (×2): 100 mg via ORAL
  Filled 2015-05-03 (×2): qty 2

## 2015-05-03 MED ORDER — CLOPIDOGREL BISULFATE 75 MG PO TABS
75.0000 mg | ORAL_TABLET | Freq: Every day | ORAL | Status: DC
  Administered 2015-05-04: 75 mg via ORAL
  Filled 2015-05-03: qty 1

## 2015-05-03 MED ORDER — ATORVASTATIN CALCIUM 80 MG PO TABS
80.0000 mg | ORAL_TABLET | Freq: Every day | ORAL | Status: DC
  Administered 2015-05-03: 17:00:00 80 mg via ORAL
  Filled 2015-05-03: qty 1

## 2015-05-03 MED ORDER — AMLODIPINE BESYLATE 5 MG PO TABS
10.0000 mg | ORAL_TABLET | Freq: Every day | ORAL | Status: DC
  Administered 2015-05-03 – 2015-05-04 (×2): 10 mg via ORAL
  Filled 2015-05-03 (×2): qty 2

## 2015-05-03 MED ORDER — ISOSORBIDE MONONITRATE ER 60 MG PO TB24
60.0000 mg | ORAL_TABLET | Freq: Every day | ORAL | Status: DC
  Administered 2015-05-03 – 2015-05-04 (×2): 60 mg via ORAL
  Filled 2015-05-03 (×2): qty 1

## 2015-05-03 MED ORDER — ACTIVE PARTNERSHIP FOR HEALTH OF YOUR HEART BOOK
Freq: Once | Status: DC
  Filled 2015-05-03: qty 1

## 2015-05-03 MED ORDER — ASPIRIN 81 MG PO CHEW
81.0000 mg | CHEWABLE_TABLET | ORAL | Status: AC
Start: 2015-05-04 — End: 2015-05-03
  Administered 2015-05-03: 81 mg via ORAL

## 2015-05-03 MED ORDER — NITROGLYCERIN 1 MG/10 ML FOR IR/CATH LAB
INTRA_ARTERIAL | Status: AC
  Filled 2015-05-03: qty 10

## 2015-05-03 MED ORDER — INSULIN ASPART 100 UNIT/ML ~~LOC~~ SOLN
7.0000 [IU] | Freq: Three times a day (TID) | SUBCUTANEOUS | Status: DC
  Administered 2015-05-03 – 2015-05-04 (×3): 7 [IU] via SUBCUTANEOUS

## 2015-05-03 MED ORDER — CARVEDILOL 12.5 MG PO TABS
25.0000 mg | ORAL_TABLET | Freq: Two times a day (BID) | ORAL | Status: DC
  Administered 2015-05-03 – 2015-05-04 (×2): 25 mg via ORAL
  Filled 2015-05-03 (×2): qty 2

## 2015-05-03 MED ORDER — WARFARIN - PHYSICIAN DOSING INPATIENT
Freq: Every day | Status: DC
  Administered 2015-05-03: 21:00:00

## 2015-05-03 MED ORDER — BIVALIRUDIN BOLUS VIA INFUSION - CUPID: INTRAVENOUS | Status: DC | PRN

## 2015-05-03 MED ORDER — AMLODIPINE BESYLATE 10 MG PO TABS
10.0000 mg | ORAL_TABLET | Freq: Once | ORAL | Status: AC
  Administered 2015-05-03: 10 mg via ORAL
  Filled 2015-05-03: qty 1

## 2015-05-03 MED ORDER — CLOPIDOGREL BISULFATE 300 MG PO TABS: ORAL_TABLET | ORAL | Status: DC | PRN

## 2015-05-03 MED ORDER — SODIUM CHLORIDE 0.9 % IJ SOLN: 3.0000 mL | Freq: Two times a day (BID) | INTRAMUSCULAR | Status: DC

## 2015-05-03 MED ORDER — METFORMIN HCL 500 MG PO TABS: 1000.0000 mg | ORAL_TABLET | Freq: Two times a day (BID) | ORAL | Status: DC

## 2015-05-03 MED ORDER — HYDRALAZINE HCL 50 MG PO TABS
50.0000 mg | ORAL_TABLET | Freq: Once | ORAL | Status: AC
  Administered 2015-05-03: 50 mg via ORAL
  Filled 2015-05-03: qty 1

## 2015-05-03 MED ORDER — ACETAMINOPHEN 325 MG PO TABS
650.0000 mg | ORAL_TABLET | ORAL | Status: DC | PRN
  Administered 2015-05-03: 17:00:00 650 mg via ORAL
  Filled 2015-05-03: qty 2

## 2015-05-03 MED ORDER — SODIUM CHLORIDE 0.9 % IV SOLN
250.0000 mg | INTRAVENOUS | Status: DC | PRN
  Administered 2015-05-03: 1.75 mg/kg/h via INTRAVENOUS

## 2015-05-03 MED ORDER — LIDOCAINE HCL (PF) 1 % IJ SOLN
INTRAMUSCULAR | Status: AC
  Filled 2015-05-03: qty 30

## 2015-05-03 MED ORDER — CLOPIDOGREL BISULFATE 300 MG PO TABS
ORAL_TABLET | ORAL | Status: AC
  Filled 2015-05-03: qty 2

## 2015-05-03 MED ORDER — HYDRALAZINE HCL 25 MG PO TABS
50.0000 mg | ORAL_TABLET | Freq: Three times a day (TID) | ORAL | Status: DC
  Administered 2015-05-03 – 2015-05-04 (×4): 50 mg via ORAL
  Filled 2015-05-03 (×3): qty 1
  Filled 2015-05-03: qty 2
  Filled 2015-05-03: qty 1
  Filled 2015-05-03 (×2): qty 2
  Filled 2015-05-03 (×2): qty 1

## 2015-05-03 MED ORDER — BIVALIRUDIN BOLUS VIA INFUSION - CUPID
INTRAVENOUS | Status: DC | PRN
  Administered 2015-05-03: 72.825 mg via INTRAVENOUS

## 2015-05-03 MED ORDER — ISOSORBIDE MONONITRATE ER 60 MG PO TB24
60.0000 mg | ORAL_TABLET | Freq: Once | ORAL | Status: AC
  Administered 2015-05-03: 60 mg via ORAL
  Filled 2015-05-03: qty 1

## 2015-05-03 MED ORDER — HYDROCODONE-ACETAMINOPHEN 5-325 MG PO TABS
1.0000 | ORAL_TABLET | Freq: Four times a day (QID) | ORAL | Status: DC | PRN
  Administered 2015-05-03 – 2015-05-04 (×2): 1 via ORAL
  Filled 2015-05-03 (×2): qty 1

## 2015-05-03 MED ORDER — INFLUENZA VAC SPLIT QUAD 0.5 ML IM SUSY
0.5000 mL | PREFILLED_SYRINGE | INTRAMUSCULAR | Status: AC
Start: 2015-05-04 — End: 2015-05-04
  Administered 2015-05-04: 0.5 mL via INTRAMUSCULAR
  Filled 2015-05-03: qty 0.5

## 2015-05-03 MED ORDER — FENTANYL CITRATE (PF) 100 MCG/2ML IJ SOLN
INTRAMUSCULAR | Status: DC | PRN
  Administered 2015-05-03 (×3): 25 ug via INTRAVENOUS

## 2015-05-03 MED ORDER — HEPARIN SODIUM (PORCINE) 1000 UNIT/ML IJ SOLN
INTRAMUSCULAR | Status: DC | PRN
  Administered 2015-05-03: 4500 [IU] via INTRAVENOUS

## 2015-05-03 MED ORDER — ASPIRIN 81 MG PO CHEW
CHEWABLE_TABLET | ORAL | Status: AC
  Administered 2015-05-03: 81 mg via ORAL
  Filled 2015-05-03: qty 1

## 2015-05-03 MED ORDER — ONDANSETRON HCL 4 MG/2ML IJ SOLN: 4.0000 mg | Freq: Four times a day (QID) | INTRAMUSCULAR | Status: DC | PRN

## 2015-05-03 MED ORDER — SODIUM CHLORIDE 0.9 % IJ SOLN
3.0000 mL | Freq: Two times a day (BID) | INTRAMUSCULAR | Status: DC
  Administered 2015-05-03 – 2015-05-04 (×3): 3 mL via INTRAVENOUS

## 2015-05-03 MED ORDER — HEPARIN SODIUM (PORCINE) 1000 UNIT/ML IJ SOLN
INTRAMUSCULAR | Status: AC
  Filled 2015-05-03: qty 1

## 2015-05-03 MED ORDER — MIDAZOLAM HCL 2 MG/2ML IJ SOLN
INTRAMUSCULAR | Status: DC | PRN
  Administered 2015-05-03: 2 mg via INTRAVENOUS
  Administered 2015-05-03 (×2): 1 mg via INTRAVENOUS

## 2015-05-03 MED ORDER — CARVEDILOL 25 MG PO TABS
25.0000 mg | ORAL_TABLET | Freq: Once | ORAL | Status: AC
  Administered 2015-05-03: 25 mg via ORAL
  Filled 2015-05-03: qty 1

## 2015-05-03 MED ORDER — SODIUM CHLORIDE 0.9 % IV SOLN
0.1500 mg/kg/h | INTRAVENOUS | Status: DC
  Filled 2015-05-03: qty 250

## 2015-05-03 MED ORDER — SODIUM CHLORIDE 0.9 % WEIGHT BASED INFUSION: 1.0000 mL/kg/h | INTRAVENOUS | Status: AC

## 2015-05-03 MED ORDER — INSULIN GLARGINE 100 UNIT/ML ~~LOC~~ SOLN
60.0000 [IU] | Freq: Every day | SUBCUTANEOUS | Status: DC
  Administered 2015-05-03: 21:00:00 60 [IU] via SUBCUTANEOUS
  Filled 2015-05-03 (×2): qty 0.6

## 2015-05-03 SURGICAL SUPPLY — 32 items
BALLN EUPHORA RX 2.0X12 (BALLOONS) ×2
BALLN MINITREK OTW 2.0X12 (BALLOONS) ×2
BALLN ~~LOC~~ TREK RX 2.75X8 (BALLOONS) ×2
BALLOON EUPHORA RX 2.0X12 (BALLOONS) IMPLANT
BALLOON MINITREK OTW 2.0X12 (BALLOONS) IMPLANT
BALLOON ~~LOC~~ TREK RX 2.75X8 (BALLOONS) IMPLANT
CATH BALLN WEDGE 5F 110CM (CATHETERS) ×2 IMPLANT
CATH INFINITI 5 FR 3DRC (CATHETERS) ×1 IMPLANT
CATH INFINITI 5 FR JL3.5 (CATHETERS) ×2 IMPLANT
CATH INFINITI 5FR ANG PIGTAIL (CATHETERS) ×2 IMPLANT
CATH INFINITI JR4 5F (CATHETERS) ×2 IMPLANT
DEVICE RAD COMP TR BAND LRG (VASCULAR PRODUCTS) ×2 IMPLANT
GLIDESHEATH SLEND SS 6F .021 (SHEATH) ×2 IMPLANT
GUIDE CATH RUNWAY 6FR AL 75 (CATHETERS) ×1 IMPLANT
GUIDEWIRE .025 260CM (WIRE) ×1 IMPLANT
KIT ENCORE 26 ADVANTAGE (KITS) ×1 IMPLANT
KIT HEART LEFT (KITS) ×2 IMPLANT
KIT HEART RIGHT NAMIC (KITS) ×2 IMPLANT
PACK CARDIAC CATHETERIZATION (CUSTOM PROCEDURE TRAY) ×2 IMPLANT
SHEATH FAST CATH BRACH 5F 5CM (SHEATH) ×2 IMPLANT
STENT SYNERGY DES 2.5X16 (Permanent Stent) ×1 IMPLANT
STENT SYNERGY DES 3X12 (Permanent Stent) ×1 IMPLANT
SYR MEDRAD MARK V 150ML (SYRINGE) ×2 IMPLANT
TRANSDUCER W/STOPCOCK (MISCELLANEOUS) ×4 IMPLANT
TUBING CIL FLEX 10 FLL-RA (TUBING) ×2 IMPLANT
VALVE GUARDIAN II ~~LOC~~ HEMO (MISCELLANEOUS) ×1 IMPLANT
WIRE ASAHI FIELDER XT 190CM (WIRE) ×1 IMPLANT
WIRE ASAHI FIELDER XT 300CM (WIRE) ×1 IMPLANT
WIRE ASAHI PROWATER 180CM (WIRE) ×1 IMPLANT
WIRE HI TORQ VERSACORE-J 145CM (WIRE) ×3 IMPLANT
WIRE MICROINTRODUCER 60CM (WIRE) ×1 IMPLANT
WIRE SAFE-T 1.5MM-J .035X260CM (WIRE) ×2 IMPLANT

## 2015-05-03 NOTE — Interval H&P Note (Signed)
Cath Lab Visit (complete for each Cath Lab visit)  Clinical Evaluation Leading to the Procedure:   ACS: No.  Non-ACS:    Anginal Classification: CCS III  Anti-ischemic medical therapy: Minimal Therapy (1 class of medications)  Non-Invasive Test Results: High-risk stress test findings: cardiac mortality >3%/year  Prior CABG: No previous CABG      History and Physical Interval Note:  05/03/2015 8:01 AM  [name] [name]  has presented today for surgery, with the diagnosis of abnormal myoview  The various methods of treatment have been discussed with the patient and family. After consideration of risks, benefits and other options for treatment, the patient has consented to  Procedure(s): Right/Left Heart Cath and Coronary Angiography (N/A) as a surgical intervention .  The patient's history has been reviewed, patient examined, no change in status, stable for surgery.  I have reviewed the patient's chart and labs.  Questions were answered to the patient's satisfaction.     Dalton [employer]

## 2015-05-03 NOTE — Progress Notes (Signed)
Resting eyes closed, had urine output via urinal of 525cc. Pt Right upper extremity without neurovascular compromise, pt. Offers no complaints of numbness or tingling to r hand. Cap refill less than 3 seconds, awaiting bed assignment.

## 2015-05-03 NOTE — Progress Notes (Signed)
TR BAND REMOVAL  LOCATION:    right radial  DEFLATED PER PROTOCOL:    Yes.    TIME BAND OFF / DRESSING APPLIED:    1600   SITE UPON ARRIVAL:    Level 0  SITE AFTER BAND REMOVAL:    Level 0  REVERSE ALLEN'S TEST:     positive  CIRCULATION SENSATION AND MOVEMENT:    Within Normal Limits   Yes.    COMMENTS:   Tolerated procedure well 

## 2015-05-03 NOTE — Progress Notes (Signed)
Site area: right brachial ( venous sheath )  Site Prior to Removal:  Level 0  Pressure Applied For 10 MINUTES    Minutes Beginning at 1455  Manual:   Yes.    Patient Status During Pull:  AAO x 4  Post Pull brachial  Site:  Level 0  Post Pull Instructions Given:  Yes.    Post Pull Pulses Present:  Yes.    Dressing Applied:  Yes.    Comments:  Tolerated procedure well

## 2015-05-03 NOTE — H&P (View-Only) (Signed)
Patient ID: [name] [name], male   DOB: 04/09/1956, 59 y.o.   MRN: [id number] PCP: Dr. Doreene Burke  59 yo with history of HTN, diabetes, paroxysmal atrial fibrillation, and CAD presents for cardiology followup.  He was admitted in 5/13 with chest pain with exertion.  LHC was done showing global HK with EF 45% and diffuse, severe distal and branch vessel disease.  There was no interventional option, but I suspect that this disease could be causing his anginal-type pain.  Echo at that time was read as showing EF 60-65%.  He was admitted in 5/14 with hypertensive urgency and chest pain.  He ruled out for MI and BP was controlled.  His EF was 50-55% by echo.  ETT-Sestamibi done as outpatient showed small fixed inferior defect with no ischemia but EF was 31%.  He was quite hypertensive during the study.  Cardiac MRI done to confirm EF in 5/14 showed EF 44%.  He had an episode of atrial fibrillation/RVR in 3/15 but went back into NSR.  Last echo in 2/16 showed EF 55-60% with moderate diastolic dysfunction.   No tachypalpitations.  He is in NSR today.  No exertional chest pain.  He is short of breath after walking short distances or with going up steps or inclines.  He has orthopnea and sleeps on several pillows.  He is not taking spironolactone yet and has been out of losartan for a couple of days.  BP is borderline high today.  I had him do a Lexiscan Cardiolite last week => this was a high risk study with EF 26% and inferior ischemia.    At last appointment, I increased his Lasix and weight is down 6 lbs but exertional symptoms are unchanged.   Labs (5/13): K 3.6, creatinine 0.93, LDL 85, HDL 57 Labs (7/13): K 3.9, creatinine 1.1, LDL 57, HDL 51  Labs (5/14): K 4, creatinine 1.25 Labs (8/14): K 3.7, creatinine 0.9, LDL 103, HDL 42 Labs (8/15): K 3.5 creatinine 1.0, BNP 75 Labs (2/16): K 3.8, creatinine 1.1 Labs (9/16): K 3.5, creatinine 1.03, BNP 59, HDL 40, LDL 111, TGs 333  PMH: 1. DM2 2. Gout 3. HTN:  Cough with ACEI.  4. Cardiomyopathy: Suspect mixed ischemic/nonischemic (?ETOH-related).  EF 35% in 2008.  Echo in 5/13 showed EF 60-65% with moderate LVH but EF was 45% on LV-gram in 5/13.  Echo (5/14) with EF 50-55%, mild LVH, inferobasal HK, mild MR.  ETT-Sestamibi in 5/14, however, showed EF 31%.  Cardiac MRI (6/14) with EF 44%, mild global hypokinesis, subepicardial delayed enhancement in a nonspecific RV insertion site pattern.  Echo (2/16) with EF 55-60%, moderate diastolic dysfunction.  5. CAD: LHC in 2010 with mild nonobstructive disease.  LHC (5/13) with diffuse distal and branch vessel disease, mild global hypokinesis and EF 45%.  ETT-Sestamibi (5/14): EF 31%, small fixed inferior defect with no ischemia.  Lexiscan Cardiolite (9/16) with EF 26%, moderate inferior defect that was partially reversible, suggesting ischemia => High risk study.  6. H/o chronic HBV 7. Osteochondroma left shoulder.  8. Hyperlipidemia 9. Paroxysmal atrial fibrillation: Coumadin.  Developed cough and increased ESR with amiodarone.  10. Atrial flutter: had ablations in 2007 and 2008.  11. OSA on CPAP.   SH: Married, prior smoker.  Some ETOH, occasionally heavy in past.  Does use occasional marijuana. Out of work Engineer, drilling. 2 daughters in grad school.   FH: CAD  ROS: All systems reviewed and negative except as per HPI.   Current Outpatient Prescriptions  Medication  Sig Dispense Refill  . amLODipine (NORVASC) 10 MG tablet Take 1 tablet (10 mg total) by mouth daily. 90 tablet 3  . carvedilol (COREG) 25 MG tablet Take 1 tablet (25 mg total) by mouth 2 (two) times daily with a meal. 180 tablet 3  . furosemide (LASIX) 40 MG tablet Take 1 tablet by mouth in the AM and 1/2 tablet (43m) by mouth in the PM 45 tablet 1  . hydrALAZINE (APRESOLINE) 50 MG tablet Take 1 tablet (50 mg total) by mouth 3 (three) times daily. 90 tablet 6  . HYDROcodone-acetaminophen (NORCO/VICODIN) 5-325 MG per tablet Take 1 tablet by mouth  every 6 (six) hours as needed. (Patient taking differently: Take 1 tablet by mouth every 6 (six) hours as needed (pain). ) 4 tablet 0  . insulin aspart (NOVOLOG) 100 UNIT/ML FlexPen Inject 7 Units into the skin 3 (three) times daily with meals. 15 mL 3  . insulin glargine (LANTUS) 100 UNIT/ML injection Inject 0.6 mLs (60 Units total) into the skin at bedtime. 10 mL 11  . isosorbide mononitrate (IMDUR) 60 MG 24 hr tablet Take 1 tablet (60 mg total) by mouth daily. 90 tablet 3  . losartan (COZAAR) 100 MG tablet Take 1 tablet (100 mg total) by mouth daily. 90 tablet 3  . metFORMIN (GLUCOPHAGE) 1000 MG tablet Take 1 tablet (1,000 mg total) by mouth 2 (two) times daily with a meal. 180 tablet 3  . potassium chloride SA (KLOR-CON M20) 20 MEQ tablet Take 1 tablet (20 mEq total) by mouth daily. 30 tablet 3  . spironolactone (ALDACTONE) 25 MG tablet Take 1 tablet (25 mg total) by mouth daily. 30 tablet 1  . warfarin (COUMADIN) 5 MG tablet Take 1 tablet (5 mg total) by mouth as directed. 90 tablet 3  . atorvastatin (LIPITOR) 80 MG tablet Take 1 tablet (80 mg total) by mouth daily. 90 tablet 3   No current facility-administered medications for this visit.    BP 140/90 mmHg  Pulse 73  Ht 6' (1.829 m)  Wt 207 lb 1.9 oz (93.949 kg)  BMI 28.08 kg/m2 General: NAD Neck: JVP 7 cm, no thyromegaly or thyroid nodule.  Lungs: Clear to auscultation bilaterally with normal respiratory effort. CV: Nondisplaced PMI.  Heart regular S1/S2, no S3/S4, no murmur.  No peripheral edema.  No carotid bruit.  Normal pedal pulses.  Abdomen: Soft, nontender, no hepatosplenomegaly, no distention.  Neurologic: Alert and oriented x 3.  Psych: Normal affect. Extremities: No clubbing or cyanosis.   Assessment/Plan:  Atrial fibrillation Paroxysmal atrial fibrillation, continue coumadin. He is in NSR today.  Coronary artery disease Diffuse distal and branch vessel disease not amenable to intervention on cath in 5/13. Last  ETT-Sestamibi (5/14) with small inferior fixed defect, no ischemia.  He has not had chest pain but does have significant exertional dyspnea. Lexiscan Cardiolite last week was a high risk study with EF 26% and significant ischemia. - He will need RHC/LHC next week.  We discussed the risks/benefits of the procedure and he agrees to proceed.  - He is on warfarin so does not have to take ASA given stable disease.  Will need to hold warfarin the three days prior to cath.  - Continue statin.  HYPERCHOLESTEROLEMIA  LDL and TGs too high, will increase atorvastatin to 80 mg daily.     Cardiomyopathy  EF 47% on cardiac MRI in 6/14 but EF improved back to 55-60% on 2/16 echo.  However, EF down to 26% on Cardiolite last  week.  At last visit, he was volume overloaded and I increased Lasix.  Weight is down but exertional symptoms continue unchanged.   - Continue current Coreg and losartan. - He needs to restart spironolactone for BP control.  BMET 2 wks after starting spironolactone. - Continue hydralazine 50 mg tid and Imdur 60 daily.  - Avoid ETOH. - Continue Lasix 40 qam/20 qpm.  BMET today.  - Echo to confirm EF.    Hypertension BP high today.  Poor compliance plays a role in this => he is out of losartan and has not started spironolactone.   OSA Continue CPAP.   Loralie Champagne 05/01/2015

## 2015-05-03 NOTE — Progress Notes (Signed)
R and LHC 05/03/15

## 2015-05-04 ENCOUNTER — Other Ambulatory Visit: Payer: Self-pay

## 2015-05-04 DIAGNOSIS — Z955 Presence of coronary angioplasty implant and graft: Secondary | ICD-10-CM

## 2015-05-04 DIAGNOSIS — E1165 Type 2 diabetes mellitus with hyperglycemia: Secondary | ICD-10-CM | POA: Diagnosis not present

## 2015-05-04 DIAGNOSIS — I2510 Atherosclerotic heart disease of native coronary artery without angina pectoris: Secondary | ICD-10-CM | POA: Diagnosis not present

## 2015-05-04 DIAGNOSIS — R931 Abnormal findings on diagnostic imaging of heart and coronary circulation: Secondary | ICD-10-CM

## 2015-05-04 DIAGNOSIS — I10 Essential (primary) hypertension: Secondary | ICD-10-CM | POA: Diagnosis not present

## 2015-05-04 LAB — CBC
HCT: 42.4 % (ref 39.0–52.0)
Hemoglobin: 14.2 g/dL (ref 13.0–17.0)
MCH: 28.2 pg (ref 26.0–34.0)
MCHC: 33.5 g/dL (ref 30.0–36.0)
MCV: 84.3 fL (ref 78.0–100.0)
PLATELETS: 193 10*3/uL (ref 150–400)
RBC: 5.03 MIL/uL (ref 4.22–5.81)
RDW: 14.3 % (ref 11.5–15.5)
WBC: 5.9 10*3/uL (ref 4.0–10.5)

## 2015-05-04 LAB — PROTIME-INR
INR: 1.62 — ABNORMAL HIGH (ref 0.00–1.49)
PROTHROMBIN TIME: 19.3 s — AB (ref 11.6–15.2)

## 2015-05-04 LAB — BASIC METABOLIC PANEL
Anion gap: 7 (ref 5–15)
BUN: 13 mg/dL (ref 6–20)
CALCIUM: 9 mg/dL (ref 8.9–10.3)
CO2: 24 mmol/L (ref 22–32)
CREATININE: 1.02 mg/dL (ref 0.61–1.24)
Chloride: 102 mmol/L (ref 101–111)
GFR calc non Af Amer: >60 mL/min (ref >60)
Glucose, Bld: 183 mg/dL — ABNORMAL HIGH (ref 65–99)
Potassium: 3.4 mmol/L — ABNORMAL LOW (ref 3.5–5.1)
SODIUM: 133 mmol/L — AB (ref 135–145)

## 2015-05-04 LAB — GLUCOSE, CAPILLARY
GLUCOSE-CAPILLARY: 170 mg/dL — AB (ref 65–99)
GLUCOSE-CAPILLARY: 137 mg/dL — AB (ref 65–99)
Glucose-Capillary: 77 mg/dL (ref 65–99)
Glucose-Capillary: 106 mg/dL — ABNORMAL HIGH (ref 65–99)

## 2015-05-04 MED ORDER — SPIRONOLACTONE 25 MG PO TABS
25.0000 mg | ORAL_TABLET | Freq: Every day | ORAL | Status: DC
  Administered 2015-05-04: 10:00:00 25 mg via ORAL
  Filled 2015-05-04 (×2): qty 1

## 2015-05-04 MED ORDER — ASPIRIN 81 MG PO CHEW: 81.0000 mg | CHEWABLE_TABLET | Freq: Every day | ORAL | Status: DC

## 2015-05-04 MED ORDER — CLOPIDOGREL BISULFATE 75 MG PO TABS: 75.0000 mg | ORAL_TABLET | Freq: Every day | ORAL | Status: DC

## 2015-05-04 MED ORDER — PANTOPRAZOLE SODIUM 40 MG PO TBEC: 40.0000 mg | DELAYED_RELEASE_TABLET | Freq: Every day | ORAL | Status: DC

## 2015-05-04 MED ORDER — NITROGLYCERIN 0.4 MG SL SUBL
0.4000 mg | SUBLINGUAL_TABLET | SUBLINGUAL | Status: DC | PRN
Start: 2015-05-04 — End: 2015-06-10

## 2015-05-04 MED ORDER — PANTOPRAZOLE SODIUM 40 MG PO TBEC
40.0000 mg | DELAYED_RELEASE_TABLET | Freq: Every day | ORAL | Status: DC
  Administered 2015-05-04: 40 mg via ORAL
  Filled 2015-05-04: qty 1

## 2015-05-04 MED ORDER — FUROSEMIDE 40 MG PO TABS
40.0000 mg | ORAL_TABLET | Freq: Every day | ORAL | Status: DC
  Administered 2015-05-04: 40 mg via ORAL
  Filled 2015-05-04: qty 1

## 2015-05-04 MED ORDER — FUROSEMIDE 20 MG PO TABS: 20.0000 mg | ORAL_TABLET | Freq: Every evening | ORAL | Status: DC

## 2015-05-04 MED ORDER — POTASSIUM CHLORIDE CRYS ER 20 MEQ PO TBCR: 20.0000 meq | EXTENDED_RELEASE_TABLET | Freq: Every day | ORAL | Status: DC

## 2015-05-04 MED FILL — Lidocaine HCl Local Preservative Free (PF) Inj 1%: INTRAMUSCULAR | Qty: 30 | Status: AC

## 2015-05-04 NOTE — Progress Notes (Signed)
Patient ID: [name] [name], male   DOB: 1955-09-11, 59 y.o.   MRN: [id number]   SUBJECTIVE:  No complaints this morning. Had LHC/RHC yesterday with DES x 2 to proximal and distal RCA.   LHC/RHC:  Left Main  Short, no significant disease.      Left Anterior Descending  40% ostial LAD. Diffuse disease throughout the remainder of the LAD. 40-50% mid LAD stenosis, diffuse up to 50% stenosis throughout the distal LAD.     Ramus Intermedius  Large vessel. There was an early small-moderate branch with 90% ostial stenosis. There was a moderate 2nd branch with 90% ostial stenosis. 40% proximal and mid stenosis in the main ramus.     Left Circumflex  Small system, AV LCx and moderate OM1. OM1 with 50% mid-vessel stenosis. The AV LCx and OM1 both had significant distal vessel disease.     Right Coronary Artery  Tortuous vessel. 40-50% mid RCA stenosis. 95% distal RCA stenosis.       Right Heart Pressures RHC Procedural Findings: Hemodynamics (mmHg) RA mean 8 RV 36/8 PA 39/20, mean 28 PCWP mean 14 LV 114/13 Ao 113/67  Oxygen saturations: PA 69% AO 98%  Cardiac Output (Fick) 4.8  Cardiac Index (Fick) 2.2   Scheduled Meds: . active partnership for health of your heart book   Does not apply Once  . amLODipine  10 mg Oral Daily  . aspirin  81 mg Oral Daily  . atorvastatin  80 mg Oral q1800  . carvedilol  25 mg Oral BID WC  . clopidogrel  75 mg Oral Q breakfast  . furosemide  20 mg Oral QPM  . furosemide  40 mg Oral QPC breakfast  . hydrALAZINE  50 mg Oral TID  . Influenza vac split quadrivalent PF  0.5 mL Intramuscular Tomorrow-1000  . insulin aspart  7 Units Subcutaneous TID WC  . insulin glargine  60 Units Subcutaneous QHS  . isosorbide mononitrate  60 mg Oral Daily  . losartan  100 mg Oral Daily  . [START ON 05/05/2015] metFORMIN  1,000 mg Oral BID WC  . potassium chloride SA  20 mEq Oral Daily  . potassium chloride  20 mEq Oral Daily  . sodium chloride  3 mL  Intravenous Q12H  . spironolactone  25 mg Oral Daily  . warfarin  5 mg Oral q1800  . Warfarin - Physician Dosing Inpatient   Does not apply q1800   Continuous Infusions:  PRN Meds:.sodium chloride, acetaminophen, HYDROcodone-acetaminophen, ondansetron (ZOFRAN) IV, sodium chloride   Filed Vitals:   05/03/15 1945 05/03/15 2000 05/04/15 0512 05/04/15 0733  BP: 122/71 135/78 137/75 141/80  Pulse: 79     Temp: 98.1 F (36.7 C)  98.1 F (36.7 C) 97.1 F (36.2 C)  TempSrc: Oral  Oral Oral  Resp: 18   18  Height:      Weight:      SpO2: 98% 97% 100% 100%    Intake/Output Summary (Last 24 hours) at 05/04/15 0935 Last data filed at 05/04/15 0600  Gross per 24 hour  Intake 559.06 ml  Output   2225 ml  Net -1665.94 ml    LABS: Basic Metabolic Panel:  Recent Labs  05/04/15 0347  NA 133*  K 3.4*  CL 102  CO2 24  GLUCOSE 183*  BUN 13  CREATININE 1.02  CALCIUM 9.0   Liver Function Tests: No results for input(s): AST, ALT, ALKPHOS, BILITOT, PROT, ALBUMIN in the last 72 hours. No results for input(s): LIPASE,  AMYLASE in the last 72 hours. CBC:  Recent Labs  05/04/15 0347  WBC 5.9  HGB 14.2  HCT 42.4  MCV 84.3  PLT 193   Cardiac Enzymes: No results for input(s): CKTOTAL, CKMB, CKMBINDEX, TROPONINI in the last 72 hours. BNP: Invalid input(s): POCBNP D-Dimer: No results for input(s): DDIMER in the last 72 hours. Hemoglobin A1C: No results for input(s): HGBA1C in the last 72 hours. Fasting Lipid Panel: No results for input(s): CHOL, HDL, LDLCALC, TRIG, CHOLHDL, LDLDIRECT in the last 72 hours. Thyroid Function Tests: No results for input(s): TSH, T4TOTAL, T3FREE, THYROIDAB in the last 72 hours.  Invalid input(s): FREET3 Anemia Panel: No results for input(s): VITAMINB12, FOLATE, FERRITIN, TIBC, IRON, RETICCTPCT in the last 72 hours.  RADIOLOGY: No results found.  PHYSICAL EXAM General: NAD Neck: No JVD, no thyromegaly or thyroid nodule.  Lungs: Clear to  auscultation bilaterally with normal respiratory effort. CV: Nondisplaced PMI.  Heart regular S1/S2, no S3/S4, no murmur.  No peripheral edema.  No carotid bruit.  Normal pedal pulses.  Abdomen: Soft, nontender, no hepatosplenomegaly, no distention.  Neurologic: Alert and oriented x 3.  Psych: Normal affect. Extremities: No clubbing or cyanosis. 2+ right radial pulse  TELEMETRY: Reviewed telemetry pt in NSR  ASSESSMENT AND PLAN: 59 yo with history of HTN, diabetes, paroxysmal atrial fibrillation, and CAD had cardiac cath yesterday for high risk Cardiolite and exertional dyspnea.  1. CAD: Suspect exertional dyspnea was his anginal equivalent.  Had diffuse CAD, most significant being tight distal RCA stenosis (matching Cardiolite defect).  Now s/p PCI with DES x 2 to RCA.  - Continue ASA 81 + Plavix + coumadin with INR 2-2.5 x 1 month then stop ASA 81 daily.  Continue Plavix + coumadin INR 2-2.5 x 1 year then stop Plavix.  After that, continue coumadin INR 2-3.   - Add Protonix for GI prophylaxis.  - atorvastatin 80 daily.  2. Chronic diastolic CHF: EF 38% by recent echo and by LV-gram, suspect the low EF noted by Cardiolite was inaccurate.  Filling pressures excellent on current Lasix, continue.  3. HTN: Poor control.  Would send home on amlodipine 10, Coreg 25 bid, hydralazine 50 tid, losartan 100 daily, spironolactone 25 daily.  Needs compliance with meds.  4. Diabetes: Diffuse CAD suggests poor DM control. I think he needs followup with an endocrinologist.  Would like to try to schedule at discharge if possible.  5. Disposition: Home today, needs followup with me or PA in 2 wks.  Needs coumadin clinic followup, aim INR 2-2.5 for now. Needs appt with [facility] endocrinology.  Home cardiac meds: Lasix 40 qam/20 qpm, KCl 20, spironolactone 25, losartan 100, amlodipine 10, Coreg 25 bid, hydralazine 50 tid, Imdur 60 daily, Protonix 40 daily, ASA 81 x 1 month, Plavix 75 x 1 year, coumadin aim INR 2-2.5,  atorvastatin 80 daily.  Home diabetes regimen. Make sure to go over his meds well at discharge.   Loralie Champagne 05/04/2015 9:47 AM

## 2015-05-04 NOTE — Progress Notes (Signed)
CARDIAC REHAB PHASE I   PRE:  Rate/Rhythm: 76 SR    BP: sitting 151/75    SaO2: 96 RA  MODE:  Ambulation: 450 ft   POST:  Rate/Rhythm: 84 SR    BP: sitting 168/91     SaO2: 98 RA  Tolerated well except c/o back pain. BP elevated. No CP/SOB. Ed completed with good reception however pt does admit that he misses meds sometimes. Reinforced importance and habit of taking meds, esp Plavix. Discussed low sodium and carb modified diets. Will refer to [place] [id number]   Josephina Shih [place] CES, ACSM 05/04/2015 9:24 AM

## 2015-05-04 NOTE — Discharge Instructions (Addendum)
No driving for 24 hours. No lifting over 5 lbs for 1 week. No sexual activity for 1 week. Keep procedure site clean & dry. If you notice increased pain, swelling, bleeding or pus, call/return!  You may shower, but no soaking baths/hot tubs/pools for 1 week.   Please hold Metformin for 48 hours after cath, you can restart metformin on 05/06/2015  The plan is to continue low-dose aspirin, Plavix, and Coumadin with INR goal between 2.0-2.5 for 1 month then drop aspirin. Afterward, we will continue Plavix and Coumadin INR 2.0-2.5 for one year, and then stop Plavix. After that we will continue on Coumadin with INR goal 2-3.   Information on my medicine - Coumadin   (Warfarin)  This medication education was reviewed with me or my healthcare representative as part of my discharge preparation.  The pharmacist that spoke with me during my hospital stay was:  Romona Curls, [facility]  Why was Coumadin prescribed for you? Coumadin was prescribed for you because you have a blood clot or a medical condition that can cause an increased risk of forming blood clots. Blood clots can cause serious health problems by blocking the flow of blood to the heart, lung, or brain. Coumadin can prevent harmful blood clots from forming. As a reminder your indication for Coumadin is:   Select from menu  What test will check on my response to Coumadin? While on Coumadin (warfarin) you will need to have an INR test regularly to ensure that your dose is keeping you in the desired range. The INR (international normalized ratio) number is calculated from the result of the laboratory test called prothrombin time (PT).  If an INR APPOINTMENT HAS NOT ALREADY BEEN MADE FOR YOU please schedule an appointment to have this lab work done by your health care provider within 7 days. Your INR goal is usually a number between:  2 to 3 or your provider may give you a more narrow range like 2-2.5.  Ask your health care provider during an office visit  what your goal INR is.  What  do you need to  know  About  COUMADIN? Take Coumadin (warfarin) exactly as prescribed by your healthcare provider about the same time each day.  DO NOT stop taking without talking to the doctor who prescribed the medication.  Stopping without other blood clot prevention medication to take the place of Coumadin may increase your risk of developing a new clot or stroke.  Get refills before you run out.  What do you do if you miss a dose? If you miss a dose, take it as soon as you remember on the same day then continue your regularly scheduled regimen the next day.  Do not take two doses of Coumadin at the same time.  Important Safety Information A possible side effect of Coumadin (Warfarin) is an increased risk of bleeding. You should call your healthcare provider right away if you experience any of the following: ? Bleeding from an injury or your nose that does not stop. ? Unusual colored urine (red or dark brown) or unusual colored stools (red or black). ? Unusual bruising for unknown reasons. ? A serious fall or if you hit your head (even if there is no bleeding).  Some foods or medicines interact with Coumadin (warfarin) and might alter your response to warfarin. To help avoid this: ? Eat a balanced diet, maintaining a consistent amount of Vitamin K. ? Notify your provider about major diet changes you plan to  make. ? Avoid alcohol or limit your intake to 1 drink for women and 2 drinks for men per day. (1 drink is 5 oz. wine, 12 oz. beer, or 1.5 oz. liquor.)  Make sure that ANY health care provider who prescribes medication for you knows that you are taking Coumadin (warfarin).  Also make sure the healthcare provider who is monitoring your Coumadin knows when you have started a new medication including herbals and non-prescription products.  Coumadin (Warfarin)  Major Drug Interactions  Increased Warfarin Effect Decreased Warfarin Effect  Alcohol (large  quantities) Antibiotics (esp. Septra/Bactrim, Flagyl, Cipro) Amiodarone (Cordarone) Aspirin (ASA) Cimetidine (Tagamet) Megestrol (Megace) NSAIDs (ibuprofen, naproxen, etc.) Piroxicam (Feldene) Propafenone (Rythmol SR) Propranolol (Inderal) Isoniazid (INH) Posaconazole (Noxafil) Barbiturates (Phenobarbital) Carbamazepine (Tegretol) Chlordiazepoxide (Librium) Cholestyramine (Questran) Griseofulvin Oral Contraceptives Rifampin Sucralfate (Carafate) Vitamin K   Coumadin (Warfarin) Major Herbal Interactions  Increased Warfarin Effect Decreased Warfarin Effect  Garlic Ginseng Ginkgo biloba Coenzyme Q10 Green tea St. Johns wort    Coumadin (Warfarin) FOOD Interactions  Eat a consistent number of servings per week of foods HIGH in Vitamin K (1 serving =  cup)  Collards (cooked, or boiled & drained) Kale (cooked, or boiled & drained) Mustard greens (cooked, or boiled & drained) Parsley *serving size only =  cup Spinach (cooked, or boiled & drained) Swiss chard (cooked, or boiled & drained) Turnip greens (cooked, or boiled & drained)  Eat a consistent number of servings per week of foods MEDIUM-HIGH in Vitamin K (1 serving = 1 cup)  Asparagus (cooked, or boiled & drained) Broccoli (cooked, boiled & drained, or raw & chopped) Brussel sprouts (cooked, or boiled & drained) *serving size only =  cup Lettuce, raw (green leaf, endive, romaine) Spinach, raw Turnip greens, raw & chopped   These websites have more information on Coumadin (warfarin):  FailFactory.se; VeganReport.com.au;

## 2015-05-04 NOTE — Discharge Summary (Signed)
Discharge Summary   Patient ID: [name] [name],  MRN: [id number], DOB/AGE: September 15, 1955 59 y.o.  Admit date: 05/03/2015 Discharge date: 05/04/2015  Primary Care Provider: Angelica Chessman Primary Cardiologist: Dr. Aundra Dubin  Discharge Diagnoses Principal Problem:   Abnormal nuclear stress test Active Problems:   Coronary artery disease   Stented coronary artery   Essential hypertension   Allergies Allergies  Allergen Reactions  . Ace Inhibitors     Cough  . Other Hives    Patient reports developing hives after receiving "some antibiotic years ago at [facility]". He does not know which antibiotic.    Procedures  L&RHC by Dr. Aundra Dubin 05/03/2015  Conclusion    1. Diffuse coronary disease consistent with poorly controlled diabetes and hypertension.  2. Tight distal RCA stenosis corresponding to Cardiolite abnormality. Patient is symptomatic. Will plan PCI to distal RCA. Dr Irish Lack to perform. Patient will receive Plavix.    Right Heart Pressures RHC Procedural Findings: Hemodynamics (mmHg) RA mean 8 RV 36/8 PA 39/20, mean 28 PCWP mean 14 LV 114/13 Ao 113/67  Oxygen saturations: PA 69% AO 98%  Cardiac Output (Fick) 4.8  Cardiac Index (Fick) 2.2    PCI by Dr. Irish Lack 05/03/2015 Conclusion    Successful drug-eluting stent placement to the distal RCA with a 2.5 x 16 Synergy drug-eluting stent, postdilated to 2.9 mm in diameter. Successful drug-eluting stent placement to the proximal RCA with a 3.0 x 12 Synergy drug-eluting stent, dilated to 3.2 mm in diameter. Continue dual antiplatelet therapy for at least a year without interruption. Of note, there is difficulty engaging the RCA from the right radial approach. Given the tortuosity of the RCA, an Amplatz guide catheter was used for additional support.abn      Hospital Course  59 year old male with PMH of HTN, DM, PAF and CAD. LHC was done in 5/13 showing global HK with EF 45% and diffuse, severe  distal and branch vessel disease. There was no interventional option.He had exercise Myoview in May 2014 which showed small inferior fixed defect but no ischemia. He has not had any chest discomfort but does have significant exertional dyspnea which is concerning for anginal equivalent. An outpatient Lexiscan Myoview was ordered on 04/28/2015, which came back high risk with EF 26%, medium defect of moderate severity in inferior wall which suggests ischemia. He was seen back in the clinic on 05/01/2015, after discussing various options, he agreed to undergo diagnostic left and right cardiac catheterization. He was instructed to hold Coumadin for 3 days prior to cath. Given his significantly depressed EF seen on Myoview, echocardiogram was obtained on 05/02/2015 which showed EF 95-18%, grade 2 diastolic dysfunction, no regional wall motion abnormality, mild MR, mild TR.   He underwent diagnostic cardiac cath on 05/03/2015. PT/INR was obtained prior to the procedure, INR was 1.81. Left heart cath showed 70% ulcerative proximal RCA lesion treated with DES (STENT SYNERGY DES 3X12), 80% distal RCA lesion treated with DES (STENT SYNERGY DES 2.5X16), 40% ostial LAD, 40-50% mid LAD, diffuse 50% stenosis throughout the distal LAD, 90% ostial stenosis of moderate second branch of ramus, 40% proximal ramus stenosis, 50% mid OM1 stenosis. Right heart cath showed Fick cardiac index 2.2, cardiac output 4.8, wedge pressure 14, PA pressure 39/20 with mean of 28, EF 55-60%. It was suspected the low EF seen on Myoview was not accurate. He was kept in the hospital overnight for observation.  He was seen on the following morning on 05/04/2015, at which time he was doing well  without significant discomfort. The plan is to continue low-dose aspirin, Plavix, and Coumadin with INR goal between 2.0-2.5 for 1 month then drop aspirin. Afterward, we will continue Plavix and Coumadin INR 2.0-2.5 for one year, and then stop Plavix. After that we  will continue on Coumadin with INR goal 2-3. Protonix was added for GI prophylaxis. He ambulated with cardiac rehabilitation well. He is deemed stable for discharge from cardiology perspective. Per Dr. Aundra Dubin, I will arrange outpatient follow-up with [facility] endocrinology, outpatient Coumadin clinic follow-up, and follow-up with Dr. Aundra Dubin or his PA in 2-3 weeks. I have emphasized the need to be compliance with the medication as instructed.   Discharge Vitals Blood pressure 140/90, pulse 63, temperature 97.7 F (36.5 C), temperature source Oral, resp. rate 18, height 6' (1.829 m), weight 214 lb (97.07 kg), SpO2 100 %.  Filed Weights   05/03/15 0550  Weight: 214 lb (97.07 kg)    Labs  CBC  Recent Labs  05/04/15 0347  WBC 5.9  HGB 14.2  HCT 42.4  MCV 84.3  PLT 361   Basic Metabolic Panel  Recent Labs  05/04/15 0347  NA 133*  K 3.4*  CL 102  CO2 24  GLUCOSE 183*  BUN 13  CREATININE 1.02  CALCIUM 9.0    Disposition  Pt is being discharged home today in good condition.  Follow-up Plans & Appointments      Follow-up Information    Follow up with Renato Shin, MD On 05/09/2015.   Specialty:  Endocrinology   Why:  2:00pm. Endocrinology visit   Contact information:   [address] [place] [address] [phone]       Follow up with [facility] On 05/09/2015.   Specialty:  Cardiology   Why:  11:15am. Coumadin clinic visit   Contact information:   [address] [place] [phone]      Follow up with Loralie Champagne, MD On 05/11/2015.   Specialty:  Cardiology   Why:  2:15pm. Cardiology followup   Contact information:   [address] [place] [address] [phone]       Discharge Medications    Medication List    TAKE these medications        amLODipine 10 MG tablet  Commonly known as:  NORVASC  Take 1 tablet (10 mg total) by mouth daily.      aspirin 81 MG chewable tablet  Chew 1 tablet (81 mg total) by mouth daily.     atorvastatin 80 MG tablet  Commonly known as:  LIPITOR  Take 1 tablet (80 mg total) by mouth daily.     carvedilol 25 MG tablet  Commonly known as:  COREG  Take 1 tablet (25 mg total) by mouth 2 (two) times daily with a meal.     clopidogrel 75 MG tablet  Commonly known as:  PLAVIX  Take 1 tablet (75 mg total) by mouth daily with breakfast.     furosemide 40 MG tablet  Commonly known as:  LASIX  Take 1 tablet by mouth in the AM and 1/2 tablet (20mg ) by mouth in the PM     hydrALAZINE 50 MG tablet  Commonly known as:  APRESOLINE  Take 1 tablet (50 mg total) by mouth 3 (three) times daily.     HYDROcodone-acetaminophen 5-325 MG per tablet  Commonly known as:  NORCO/VICODIN  Take 1 tablet by mouth every 6 (six)  hours as needed.     insulin aspart 100 UNIT/ML FlexPen  Commonly known as:  NOVOLOG  Inject 7 Units into the skin 3 (three) times daily with meals.     insulin glargine 100 UNIT/ML injection  Commonly known as:  LANTUS  Inject 0.6 mLs (60 Units total) into the skin at bedtime.     isosorbide mononitrate 60 MG 24 hr tablet  Commonly known as:  IMDUR  Take 1 tablet (60 mg total) by mouth daily.     losartan 100 MG tablet  Commonly known as:  COZAAR  Take 1 tablet (100 mg total) by mouth daily.     metFORMIN 1000 MG tablet  Commonly known as:  GLUCOPHAGE  Take 1 tablet (1,000 mg total) by mouth 2 (two) times daily with a meal.     nitroGLYCERIN 0.4 MG SL tablet  Commonly known as:  NITROSTAT  Place 1 tablet (0.4 mg total) under the tongue every 5 (five) minutes as needed for chest pain.     pantoprazole 40 MG tablet  Commonly known as:  PROTONIX  Take 1 tablet (40 mg total) by mouth daily.     potassium chloride SA 20 MEQ tablet  Commonly known as:  KLOR-CON M20  Take 1 tablet (20 mEq total) by mouth daily.     spironolactone 25 MG tablet  Commonly known as:  ALDACTONE  Take 1  tablet (25 mg total) by mouth daily.     warfarin 5 MG tablet  Commonly known as:  COUMADIN  Take 1 tablet (5 mg total) by mouth as directed.        Outstanding Labs/Studies  PT/INR coumadin clinic visit as scheduled above  Duration of Discharge Encounter   Greater than 30 minutes including physician time.  Hilbert Corrigan PA-C Pager: [phone] 05/04/2015, 2:29 PM

## 2015-05-04 NOTE — Progress Notes (Signed)
Called to patient room.  Patient complained of dizziness and diaphoresis.  CBG showed 77, patient given 8 oz coke; recheck CBG 106 and symptoms relieved.  VSS.  Patient discharged home.

## 2015-05-06 ENCOUNTER — Other Ambulatory Visit: Payer: Medicare Other

## 2015-05-09 ENCOUNTER — Ambulatory Visit (INDEPENDENT_AMBULATORY_CARE_PROVIDER_SITE_OTHER): Payer: Medicare Other | Admitting: *Deleted

## 2015-05-09 ENCOUNTER — Ambulatory Visit (INDEPENDENT_AMBULATORY_CARE_PROVIDER_SITE_OTHER): Payer: Medicare Other | Admitting: Endocrinology

## 2015-05-09 ENCOUNTER — Encounter: Payer: Self-pay | Admitting: Endocrinology

## 2015-05-09 VITALS — BP 132/84 | HR 70 | Temp 97.9°F | Ht 72.0 in | Wt 212.0 lb

## 2015-05-09 DIAGNOSIS — E119 Type 2 diabetes mellitus without complications: Secondary | ICD-10-CM

## 2015-05-09 DIAGNOSIS — Z5181 Encounter for therapeutic drug level monitoring: Secondary | ICD-10-CM | POA: Diagnosis not present

## 2015-05-09 DIAGNOSIS — Z7901 Long term (current) use of anticoagulants: Secondary | ICD-10-CM

## 2015-05-09 DIAGNOSIS — E1151 Type 2 diabetes mellitus with diabetic peripheral angiopathy without gangrene: Secondary | ICD-10-CM

## 2015-05-09 DIAGNOSIS — Z9114 Patient's other noncompliance with medication regimen: Secondary | ICD-10-CM | POA: Diagnosis not present

## 2015-05-09 DIAGNOSIS — I4891 Unspecified atrial fibrillation: Secondary | ICD-10-CM | POA: Diagnosis not present

## 2015-05-09 LAB — POCT GLYCOSYLATED HEMOGLOBIN (HGB A1C): Hemoglobin A1C: 8.5

## 2015-05-09 LAB — POCT INR: INR: 3.2

## 2015-05-09 MED ORDER — INSULIN GLARGINE 100 UNIT/ML ~~LOC~~ SOLN: 60.0000 [IU] | SUBCUTANEOUS | Status: DC

## 2015-05-09 NOTE — Progress Notes (Signed)
Subjective:    Patient ID: [name] [name], male    DOB: 10-12-1955, 59 y.o.   MRN: [id number]  HPI pt states DM was dx'ed in 2008; he has mild if any neuropathy of the lower extremities, but he has associated CAD and PAD; he has been on insulin since 2011; pt says his diet and exercise are not good; he has never had pancreatitis, severe hypoglycemia or DKA.  He takes multiple daily injections.  He has mild fasting hypoglycemia several times per week.  It is in general higher as the day goes on (200's).  Pt says he misses from 1 up to all of his doses of novolog.   Past Medical History  Diagnosis Date  . Hypertension   . Gout   . PAF (paroxysmal atrial fibrillation)     On coumadin  . LIVER FUNCTION TESTS, ABNORMAL, HX OF 12/30/2006  . Rectal bleeding 12/18/2011    Scheduled for colonoscopy.    [name] HEPATITIS B, CHRONIC 12/30/2006  . HYPERCHOLESTEROLEMIA 07/11/2010  . MITRAL REGURGITATION 12/30/2006  . COLONIC POLYPS, HX OF 12/30/2006  . CAD (coronary artery disease), native coronary artery     a. Nonobstructive CAD by cath 2013 - diffuse distal and branch vessel CAD, no severe disease in the major coronaries, LV mild global hypokinesis, EF 45%. b. ETT-Sestamibi 5/14: EF 31%, small fixed inferior defect with no ischemia.  . Chronic CHF     a. Mixed ICM/NICM (?EtOH). EF 35% in 2008. Echo 5/13: EF 60-65%, mod LVH, EF 45% on V gram in 12/2011. EF 12/2012: EF 50-55%, mild LVH, inferobasal HK, mild MR. ETT-Ses 5/14 EF 41%. Cardiac MRI 5/14: EF 44%, mild global HK, subepicardial delayed enhancement in nonspecific RV insertion pattern.  . H/O atrial flutter 2007    a. Ablations in 2007, 2008.  [name] History of medication noncompliance   . History of alcohol abuse   . Osteochondrosarcoma 1972    "left shoulder"  . Heart murmur   . Anginal pain   . Sleep apnea     "suppose to send mask but they never did" (05/03/2015)  . Type II diabetes mellitus   . Left sciatic nerve pain since 04/2015  . Anxiety      Past Surgical History  Procedure Laterality Date  . Osteochondroma excision Left 1972    "took bone tumor off my shoulder"  . Coronary angioplasty  12/05/01  . A flutter ablation  2007, 2008    catheter ablation   . Left heart catheterization with coronary angiogram N/A 12/19/2011    Procedure: LEFT HEART CATHETERIZATION WITH CORONARY ANGIOGRAM;  Surgeon: Larey Dresser, MD;  Location[facility] CATH LAB;  Service: Cardiovascular;  Laterality: N/A;  . Cardiac catheterization N/A 05/03/2015    Procedure: Right/Left Heart Cath and Coronary Angiography;  Surgeon: Larey Dresser, MD;  Location: [place] CV LAB;  Service: Cardiovascular;  Laterality: N/A;  . Cardiac catheterization N/A 05/03/2015    Procedure: Coronary Stent Intervention;  Surgeon: Jettie Booze, MD;  Location: [facility] LAB;  Service: Cardiovascular;  Laterality: N/A;    Social History   Social History  . Marital Status: Widowed    Spouse Name: N/A  . Number of Children: 2  . Years of Education: N/A   Occupational History  .     Social History Main Topics  . Smoking status: Former Smoker -- 4.00 packs/day for 18 years    Types: Cigarettes    Quit date: 08/14/1983  . Smokeless  tobacco: Never Used  . Alcohol Use: 9.6 oz/week    10 Shots of liquor, 6 Cans of beer per week     Comment: 05/03/2015 "1-2 beers q 3 days; 4-5 shots of liquor twice/wk"  . Drug Use: Yes    Special: Marijuana     Comment: 05/03/2015 "twice/wk maybe"  . Sexual Activity: No   Other Topics Concern  . Not on file   Social History Narrative    Current Outpatient Prescriptions on File Prior to Visit  Medication Sig Dispense Refill  . amLODipine (NORVASC) 10 MG tablet Take 1 tablet (10 mg total) by mouth daily. 90 tablet 3  . aspirin 81 MG chewable tablet Chew 1 tablet (81 mg total) by mouth daily.    [name] atorvastatin (LIPITOR) 80 MG tablet Take 1 tablet (80 mg total) by mouth daily. 90 tablet 3  . carvedilol (COREG) 25 MG tablet  Take 1 tablet (25 mg total) by mouth 2 (two) times daily with a meal. 180 tablet 3  . clopidogrel (PLAVIX) 75 MG tablet Take 1 tablet (75 mg total) by mouth daily with breakfast. 90 tablet 3  . furosemide (LASIX) 40 MG tablet Take 1 tablet by mouth in the AM and 1/2 tablet (20mg ) by mouth in the PM 45 tablet 1  . hydrALAZINE (APRESOLINE) 50 MG tablet Take 1 tablet (50 mg total) by mouth 3 (three) times daily. 90 tablet 6  . HYDROcodone-acetaminophen (NORCO/VICODIN) 5-325 MG per tablet Take 1 tablet by mouth every 6 (six) hours as needed. (Patient taking differently: Take 1 tablet by mouth every 6 (six) hours as needed (pain). ) 4 tablet 0  . isosorbide mononitrate (IMDUR) 60 MG 24 hr tablet Take 1 tablet (60 mg total) by mouth daily. 90 tablet 3  . losartan (COZAAR) 100 MG tablet Take 1 tablet (100 mg total) by mouth daily. 90 tablet 3  . metFORMIN (GLUCOPHAGE) 1000 MG tablet Take 1 tablet (1,000 mg total) by mouth 2 (two) times daily with a meal. 180 tablet 3  . nitroGLYCERIN (NITROSTAT) 0.4 MG SL tablet Place 1 tablet (0.4 mg total) under the tongue every 5 (five) minutes as needed for chest pain. 25 tablet 3  . pantoprazole (PROTONIX) 40 MG tablet Take 1 tablet (40 mg total) by mouth daily. 90 tablet 3  . potassium chloride SA (KLOR-CON M20) 20 MEQ tablet Take 1 tablet (20 mEq total) by mouth daily. 30 tablet 3  . spironolactone (ALDACTONE) 25 MG tablet Take 1 tablet (25 mg total) by mouth daily. 30 tablet 1  . warfarin (COUMADIN) 5 MG tablet Take 1 tablet (5 mg total) by mouth as directed. 90 tablet 3   No current facility-administered medications on file prior to visit.    Allergies  Allergen Reactions  . Ace Inhibitors     Cough  . Other Hives    Patient reports developing hives after receiving "some antibiotic years ago at [facility]". He does not know which antibiotic.    Family History  Problem Relation Age of Onset  . Diabetes Mother   . Hypertension Mother   . Heart  attack Neg Hx   . Stroke Neg Hx     BP 132/84 mmHg  Pulse 70  Temp(Src) 97.9 F (36.6 C) (Oral)  Ht 6' (1.829 m)  Wt 212 lb (96.163 kg)  BMI 28.75 kg/m2  SpO2 95%   Review of Systems denies weight loss, blurry vision, headache, chest pain, n/v, urinary frequency (except after taking lasix),  muscle cramps, excessive diaphoresis, memory loss, cold intolerance, rhinorrhea, and easy bruising.  He has chronic doe.    Objective:   Physical Exam VS: see vs page GEN: no distress HEAD: head: no deformity eyes: no periorbital swelling, no proptosis external nose and ears are normal mouth: no lesion seen NECK: supple, thyroid is not enlarged CHEST WALL: no deformity LUNGS: clear to auscultation BREASTS:  No gynecomastia CV: reg rate and rhythm, no murmur ABD: abdomen is soft, nontender.  no hepatosplenomegaly.  not distended.  no hernia. MUSCULOSKELETAL: muscle bulk and strength are grossly normal.  no obvious joint swelling.  gait is normal and steady EXTEMITIES: no deformity.  no ulcer on the feet.  feet are of normal color and temp.  no edema.  There is bilateral onychomycosis of the toenails.  PULSES: dorsalis pedis intact bilat.  no carotid bruit NEURO:  cn 2-12 grossly intact.   readily moves all 4's.  sensation is intact to touch on the feet SKIN:  Normal texture and temperature.  No rash or suspicious lesion is visible.   NODES:  None palpable at the neck PSYCH: alert, well-oriented.  Does not appear anxious nor depressed.   A1c=8.5%  i personally reviewed electrocardiogram tracing (05/04/15): Indication: CAD Impression: high voltage and NS-T-wave abnormality    Assessment & Plan:  DM: new to me: he needs increased rx. Noncompliance with insulin dosing: we discussed.  Pt will take qd insulin for now  Patient is advised the following: Patient Instructions  good diet and exercise significantly improve the control of your diabetes.  please let me know if you wish to be  referred to a dietician.  high blood sugar is very risky to your health.  you should see an eye doctor and dentist every year.  It is very important to get all recommended vaccinations.  controlling your blood pressure and cholesterol drastically reduces the damage diabetes does to your body.  Those who smoke should quit.  please discuss these with your doctor.   check your blood sugar twice a day.  vary the time of day when you check, between before the 3 meals, and at bedtime.  also check if you have symptoms of your blood sugar being too high or too low.  please keep a record of the readings and bring it to your next appointment here.  You can write it on any piece of paper.  please call us sooner if your blood sugar goes below 70, or if you have a lot of readings over 200.   For now: Please change the lantus to the morning (same 60 units), and: Please continue the same metformin, and: Stop taking the novolog. Please come back for a follow-up appointment in 2 weeks Please call us in a few days, to tell us how the blood sugar is doing.

## 2015-05-09 NOTE — Patient Instructions (Addendum)
good diet and exercise significantly improve the control of your diabetes.  please let me know if you wish to be referred to a dietician.  high blood sugar is very risky to your health.  you should see an eye doctor and dentist every year.  It is very important to get all recommended vaccinations.  controlling your blood pressure and cholesterol drastically reduces the damage diabetes does to your body.  Those who smoke should quit.  please discuss these with your doctor.   check your blood sugar twice a day.  vary the time of day when you check, between before the 3 meals, and at bedtime.  also check if you have symptoms of your blood sugar being too high or too low.  please keep a record of the readings and bring it to your next appointment here.  You can write it on any piece of paper.  please call us sooner if your blood sugar goes below 70, or if you have a lot of readings over 200.   For now: Please change the lantus to the morning (same 60 units), and: Please continue the same metformin, and: Stop taking the novolog. Please come back for a follow-up appointment in 2 weeks Please call us in a few days, to tell us how the blood sugar is doing.

## 2015-05-11 ENCOUNTER — Ambulatory Visit (INDEPENDENT_AMBULATORY_CARE_PROVIDER_SITE_OTHER): Payer: Medicare Other | Admitting: Cardiology

## 2015-05-11 ENCOUNTER — Encounter: Payer: Self-pay | Admitting: Cardiology

## 2015-05-11 VITALS — BP 100/60 | HR 75 | Ht 72.0 in | Wt 210.4 lb

## 2015-05-11 DIAGNOSIS — I5032 Chronic diastolic (congestive) heart failure: Secondary | ICD-10-CM

## 2015-05-11 DIAGNOSIS — I480 Paroxysmal atrial fibrillation: Secondary | ICD-10-CM

## 2015-05-11 DIAGNOSIS — I5042 Chronic combined systolic (congestive) and diastolic (congestive) heart failure: Secondary | ICD-10-CM

## 2015-05-11 DIAGNOSIS — I2510 Atherosclerotic heart disease of native coronary artery without angina pectoris: Secondary | ICD-10-CM

## 2015-05-11 DIAGNOSIS — I10 Essential (primary) hypertension: Secondary | ICD-10-CM | POA: Diagnosis not present

## 2015-05-11 DIAGNOSIS — R0602 Shortness of breath: Secondary | ICD-10-CM

## 2015-05-11 DIAGNOSIS — I25118 Atherosclerotic heart disease of native coronary artery with other forms of angina pectoris: Secondary | ICD-10-CM

## 2015-05-11 LAB — CBC WITH DIFFERENTIAL/PLATELET
BASOS ABS: 0 10*3/uL (ref 0.0–0.1)
Basophils Relative: 0.7 % (ref 0.0–3.0)
Eosinophils Absolute: 0.3 10*3/uL (ref 0.0–0.7)
Eosinophils Relative: 4.5 % (ref 0.0–5.0)
HCT: 45.9 % (ref 39.0–52.0)
Hemoglobin: 15.1 g/dL (ref 13.0–17.0)
LYMPHS ABS: 2.4 10*3/uL (ref 0.7–4.0)
Lymphocytes Relative: 40.4 % (ref 12.0–46.0)
MCHC: 33 g/dL (ref 30.0–36.0)
MCV: 84.8 fl (ref 78.0–100.0)
MONO ABS: 0.6 10*3/uL (ref 0.1–1.0)
Monocytes Relative: 9.8 % (ref 3.0–12.0)
NEUTROS PCT: 44.6 % (ref 43.0–77.0)
Neutro Abs: 2.7 10*3/uL (ref 1.4–7.7)
Platelets: 214 10*3/uL (ref 150.0–400.0)
RBC: 5.41 Mil/uL (ref 4.22–5.81)
RDW: 14.6 % (ref 11.5–15.5)
WBC: 6 10*3/uL (ref 4.0–10.5)

## 2015-05-11 LAB — BASIC METABOLIC PANEL
BUN: 14 mg/dL (ref 6–23)
CALCIUM: 9.6 mg/dL (ref 8.4–10.5)
CO2: 28 mEq/L (ref 19–32)
Chloride: 101 mEq/L (ref 96–112)
Creatinine, Ser: 1.13 mg/dL (ref 0.40–1.50)
GFR: 84.82 mL/min (ref >60.00)
GLUCOSE: 136 mg/dL — AB (ref 70–99)
Potassium: 3.7 mEq/L (ref 3.5–5.1)
SODIUM: 135 meq/L (ref 135–145)

## 2015-05-11 NOTE — Patient Instructions (Signed)
Medication Instructions:  Your physician recommends that you continue on your current medications as directed. Please refer to the Current Medication list given to you today.   Labwork: Your physician recommends that you return for lab work: TODAY (cbc,bmet)  Your physician recommends that you return for lab work in: 2 months (lipid/liver)   Testing/Procedures: None   Follow-Up: Your physician recommends that you schedule a follow-up appointment in: 1 month with Richardson Dopp, PA.  Your physician wants you to follow-up in: 3 months with Dr. Aundra Dubin. You will receive a reminder letter in the mail two months in advance. If you don't receive a letter, please call our office to schedule the follow-up appointment.  Dr. Aundra Dubin would like for you to get a blood pressure cuff and check you blood pressure DAILY.  Keep a log of your daily blood pressure and heart rate and bring readings with you to next office visit.   Dr. Aundra Dubin has referred you over to Cardiac Rehab to start therapy.  There office will be in contact with you to get you started.  Any Other Special Instructions Will Be Listed Below (If Applicable).

## 2015-05-12 NOTE — Progress Notes (Signed)
Patient ID: Cris Talavera, male   DOB: 1956/04/22, 59 y.o.   MRN: [id number] PCP: Dr. Doreene Burke  59 yo with history of HTN, diabetes, paroxysmal atrial fibrillation, and CAD presents for cardiology followup.  He was admitted in 5/13 with chest pain with exertion.  LHC was done showing global HK with EF 45% and diffuse, severe distal and branch vessel disease.  There was no interventional option, but I suspect that this disease could be causing his anginal-type pain.  Echo at that time was read as showing EF 60-65%.  He was admitted in 5/14 with hypertensive urgency and chest pain.  He ruled out for MI and BP was controlled.  His EF was 50-55% by echo.  ETT-Sestamibi done as outpatient showed small fixed inferior defect with no ischemia but EF was 31%.  He was quite hypertensive during the study.  Cardiac MRI done to confirm EF in 5/14 showed EF 44%.  He had an episode of atrial fibrillation/RVR in 3/15 but went back into NSR.  Echo in 2/16 showed EF 55-60% with moderate diastolic dysfunction.   Prior to last appointment, he had a Cardiolite showing inferior ischemia.  He was having exertional dyspnea with less activity than normal.  I was concerned for worsened CAD, so took him for [facility] in 9/16. By RHC, filling pressures were not significantly elevated. He had a severe distal RCA stenosis that was treated with DES x 2.  Echo showed EF 55-60%.  He has seen endocrinology for help with diabetes management.   Today, doing well.  No chest pain.  He is not having much exertional dyspnea, there is a definite improvement compared to prior to PCI. No lightheadedness.  No orthopnea/PND.   Labs (5/13): K 3.6, creatinine 0.93, LDL 85, HDL 57 Labs (7/13): K 3.9, creatinine 1.1, LDL 57, HDL 51  Labs (5/14): K 4, creatinine 1.25 Labs (8/14): K 3.7, creatinine 0.9, LDL 103, HDL 42 Labs (8/15): K 3.5 creatinine 1.0, BNP 75 Labs (2/16): K 3.8, creatinine 1.1 Labs (9/16): K 3.5, creatinine 1.03, BNP 59, HDL 40, LDL 111,  TGs 333  PMH: 1. DM2 2. Gout 3. HTN: Cough with ACEI.  4. Cardiomyopathy: Suspect mixed ischemic/nonischemic (?ETOH-related).  EF 35% in 2008.  Echo in 5/13 showed EF 60-65% with moderate LVH but EF was 45% on LV-gram in 5/13.  Echo (5/14) with EF 50-55%, mild LVH, inferobasal HK, mild MR.  ETT-Sestamibi in 5/14, however, showed EF 31%.  Cardiac MRI (6/14) with EF 44%, mild global hypokinesis, subepicardial delayed enhancement in a nonspecific RV insertion site pattern.  Echo (2/16) with EF 55-60%, moderate diastolic dysfunction. RHC (9/16) with mean RA 8, PA 39/20 mean 28, mean PCWP 14, CI 2.2. Echo (9/16) with EF 55-60%, grade II diastolic dysfunction.  5. CAD: LHC in 2010 with mild nonobstructive disease.  LHC (5/13) with diffuse distal and branch vessel disease, mild global hypokinesis and EF 45%.  ETT-Sestamibi (5/14): EF 31%, small fixed inferior defect with no ischemia.  Lexiscan Cardiolite (9/16) with EF 26%, moderate inferior defect that was partially reversible, suggesting ischemia => High risk study. LHC (9/16) with 40-50% mLAD, diffuse up to 50% distal LAD, 90% ostium of branch off ramus, 95% dRCA => DES to RCA x 2, EF 55-60%.   6. H/o chronic HBV 7. Osteochondroma left shoulder.  8. Hyperlipidemia 9. Paroxysmal atrial fibrillation: Coumadin.  Developed cough and increased ESR with amiodarone.  10. Atrial flutter: had ablations in 2007 and 2008.  11. OSA on CPAP.  SH: Married, prior smoker.  Some ETOH, occasionally heavy in past.  Does use occasional marijuana. Out of work Engineer, drilling. 2 daughters in grad school.   FH: CAD  ROS: All systems reviewed and negative except as per HPI.   Current Outpatient Prescriptions  Medication Sig Dispense Refill  . amLODipine (NORVASC) 10 MG tablet Take 1 tablet (10 mg total) by mouth daily. 90 tablet 3  . aspirin 81 MG chewable tablet Chew 1 tablet (81 mg total) by mouth daily.    [name] atorvastatin (LIPITOR) 80 MG tablet Take 1 tablet (80 mg  total) by mouth daily. 90 tablet 3  . carvedilol (COREG) 25 MG tablet Take 1 tablet (25 mg total) by mouth 2 (two) times daily with a meal. 180 tablet 3  . clopidogrel (PLAVIX) 75 MG tablet Take 1 tablet (75 mg total) by mouth daily with breakfast. 90 tablet 3  . furosemide (LASIX) 40 MG tablet Take 1 tablet by mouth in the AM and 1/2 tablet (41m) by mouth in the PM 45 tablet 1  . hydrALAZINE (APRESOLINE) 50 MG tablet Take 1 tablet (50 mg total) by mouth 3 (three) times daily. 90 tablet 6  . HYDROcodone-acetaminophen (NORCO/VICODIN) 5-325 MG per tablet Take 1 tablet by mouth every 6 (six) hours as needed. (Patient taking differently: Take 1 tablet by mouth every 6 (six) hours as needed (pain). ) 4 tablet 0  . insulin glargine (LANTUS) 100 UNIT/ML injection Inject 0.6 mLs (60 Units total) into the skin every morning. 10 mL 11  . isosorbide mononitrate (IMDUR) 60 MG 24 hr tablet Take 1 tablet (60 mg total) by mouth daily. 90 tablet 3  . losartan (COZAAR) 100 MG tablet Take 1 tablet (100 mg total) by mouth daily. 90 tablet 3  . metFORMIN (GLUCOPHAGE) 1000 MG tablet Take 1 tablet (1,000 mg total) by mouth 2 (two) times daily with a meal. 180 tablet 3  . nitroGLYCERIN (NITROSTAT) 0.4 MG SL tablet Place 1 tablet (0.4 mg total) under the tongue every 5 (five) minutes as needed for chest pain. 25 tablet 3  . pantoprazole (PROTONIX) 40 MG tablet Take 1 tablet (40 mg total) by mouth daily. 90 tablet 3  . potassium chloride SA (KLOR-CON M20) 20 MEQ tablet Take 1 tablet (20 mEq total) by mouth daily. 30 tablet 3  . spironolactone (ALDACTONE) 25 MG tablet Take 1 tablet (25 mg total) by mouth daily. 30 tablet 1  . warfarin (COUMADIN) 5 MG tablet Take 1 tablet (5 mg total) by mouth as directed. 90 tablet 3   No current facility-administered medications for this visit.    BP 100/60 mmHg  Pulse 75  Ht 6' (1.829 m)  Wt 210 lb 6.4 oz (95.437 kg)  BMI 28.53 kg/m2  SpO2 97% General: NAD Neck: JVP 7 cm, no  thyromegaly or thyroid nodule.  Lungs: Clear to auscultation bilaterally with normal respiratory effort. CV: Nondisplaced PMI.  Heart regular S1/S2, no S3/S4, no murmur.  No peripheral edema.  No carotid bruit.  Normal pedal pulses.  Abdomen: Soft, nontender, no hepatosplenomegaly, no distention.  Neurologic: Alert and oriented x 3.  Psych: Normal affect. Extremities: No clubbing or cyanosis.   Assessment/Plan:  Atrial fibrillation Paroxysmal atrial fibrillation, continue coumadin. Goal INR will be 2-2.5 while he is on Plavix also.  He is in NSR today.  Coronary artery disease LHC in 9/16 showed diffuse CAD, most significant involving the distal RCA.  Patient had DES x 2 to RCA. Symptomatically doing better, minimal  exertional dyspnea now.  - Continue ASA 81 x 1 month post-PCI then stop.  - Continue Plavix x 1 year.   - Continue coumadin long-term.  Would keep INR 2-2.5 while on Plavix. - I will refer to cardiac rehab.  - Continue statin.  - Check CBC.  HYPERCHOLESTEROLEMIA  Check lipids in 2 months with LFTs.  Cardiomyopathy  EF 47% on cardiac MRI in 6/14 but EF improved back to 55-60% on 2/16 echo.  However, EF down to 26% on Cardiolite in 9/16.  I think this was inaccurate.  EF was 55-60% by both LV-gram and echo in 9/16.  He looks euvolemic on exam.  - Continue current Coreg, losartan, spironolactone. - Continue Lasix 40 qam/20 qpm.  BMET today.  Hypertension BP on the lower side now (100/60) but he feels fine.  He says that he is taking all his meds.   OSA Continue CPAP.   Loralie Champagne 05/12/2015

## 2015-05-26 ENCOUNTER — Ambulatory Visit (INDEPENDENT_AMBULATORY_CARE_PROVIDER_SITE_OTHER): Payer: Medicare Other | Admitting: Endocrinology

## 2015-05-26 ENCOUNTER — Encounter: Payer: Medicare Other | Attending: Endocrinology | Admitting: Dietician

## 2015-05-26 ENCOUNTER — Encounter: Payer: Self-pay | Admitting: Endocrinology

## 2015-05-26 ENCOUNTER — Encounter: Payer: Self-pay | Admitting: Dietician

## 2015-05-26 VITALS — Ht 72.0 in | Wt 213.0 lb

## 2015-05-26 VITALS — BP 146/86 | HR 82 | Temp 98.1°F | Ht 72.0 in | Wt 213.0 lb

## 2015-05-26 DIAGNOSIS — E119 Type 2 diabetes mellitus without complications: Secondary | ICD-10-CM | POA: Diagnosis not present

## 2015-05-26 DIAGNOSIS — E118 Type 2 diabetes mellitus with unspecified complications: Secondary | ICD-10-CM

## 2015-05-26 DIAGNOSIS — Z794 Long term (current) use of insulin: Secondary | ICD-10-CM

## 2015-05-26 DIAGNOSIS — R030 Elevated blood-pressure reading, without diagnosis of hypertension: Secondary | ICD-10-CM

## 2015-05-26 DIAGNOSIS — Z713 Dietary counseling and surveillance: Secondary | ICD-10-CM | POA: Insufficient documentation

## 2015-05-26 MED ORDER — INSULIN GLARGINE 100 UNIT/ML ~~LOC~~ SOLN: 65.0000 [IU] | SUBCUTANEOUS | Status: DC

## 2015-05-26 NOTE — Progress Notes (Signed)
Diabetes Self-Management Education  Visit Type: First/Initial  Appt. Start Time: 1115 Appt. End Time: 0865  05/26/2015  [name], identified by name and date of birth, is a 59 y.o. male with a diagnosis of Diabetes: Type 2.   Patient is here today with his daughter.  He does his own shopping and cooking.  He is a retired/disabled brick layer.  He had stents placed in his heart 3 weeks ago and he has not resumed his exercise yet.  He has been non compliant with his insulin and does not check his blood sugar often as he is tired of needles.  He reported to agree to insulin once per day.  He does smoke marijuana.  Other hx of CAD and PAD.  His daughter wished that I discuss his beverages further on the next appointment.  Patient drinks beer and liquor, 2 servings many days of the week.  Liquor servings may be more.    ASSESSMENT  Height 6' (1.829 m), weight 213 lb (96.616 kg). Body mass index is 28.88 kg/(m^2).      Diabetes Self-Management Education - 05/26/15 1128    Visit Information   Visit Type First/Initial   Initial Visit   Diabetes Type Type 2   Are you currently following a meal plan? No   Are you taking your medications as prescribed? No  I get tired of sticking myself   Date Diagnosed 2008 with inslin since 2011.   Health Coping   How would you rate your overall health? Fair   Psychosocial Assessment   Patient Belief/Attitude about Diabetes Motivated to manage diabetes   Self-care barriers None   Self-management support Doctor's office;Family   Other persons present Patient;Family Member  daughter   Patient Concerns Other (comment);Glycemic Control  "I want to know how to avoid losing limbs."   Special Needs None   Preferred Learning Style No preference indicated   Learning Readiness Ready   How often do you need to have someone help you when you read instructions, pamphlets, or other written materials from your doctor or pharmacy? 1 - Never   What is the  last grade level you completed in school? 2 years college   Complications   Last HgB A1C per patient/outside source 8.5 %  05/09/15   How often do you check your blood sugar? 1-2 times/day  0-2   Fasting Blood glucose range (mg/dL) 70-129;130-179   Number of hypoglycemic episodes per month 0   Number of hyperglycemic episodes per week 0   Have you had a dilated eye exam in the past 12 months? No   Have you had a dental exam in the past 12 months? No   Are you checking your feet? Yes   How many days per week are you checking your feet? 7   Dietary Intake   Breakfast grits, eggs, sausage, white wheat bread OR oatmeal with splenda and butter OR fried potatoes, eggs, sausage  9-10   Snack (morning) none   Lunch 12" sub (cold cut) or skips  12-1:30   Snack (afternoon) cheetos or graham crackers or cake, orange, or banana   Dinner meat, starch, vegetable, bread  8-9   Snack (evening) peanut butter and ritz crackers, chicken salad on ritz, vienna sausage, graham crackers   Beverage(s) diet pepsi or other soda, sugar free coolade, regular gingerale occasionally, water (small amounts)   Exercise   Exercise Type Light (walking / raking leaves)  wednesday-[employer]- pilates, basket ball and 2x/week  to [place]   How many days per week to you exercise? 3   How many minutes per day do you exercise? 1   Total minutes per week of exercise 3   Patient Education   Previous Diabetes Education Yes (please comment)  cooking classes and saw RD at [facility]   Disease state  Definition of diabetes, type 1 and 2, and the diagnosis of diabetes   Nutrition management  Role of diet in the treatment of diabetes and the relationship between the three main macronutrients and blood glucose level;Food label reading, portion sizes and measuring food.;Meal options for control of blood glucose level and chronic complications.   Physical activity and exercise  Role of exercise on diabetes management, blood  pressure control and cardiac health.;Helped patient identify appropriate exercises in relation to his/her diabetes, diabetes complications and other health issue.   Monitoring Identified appropriate SMBG and/or A1C goals.;Yearly dilated eye exam   Acute complications Taught treatment of hypoglycemia - the 15 rule.   Chronic complications Relationship between chronic complications and blood glucose control;Identified and discussed with patient  current chronic complications;Dental care;Retinopathy and reason for yearly dilated eye exams   Psychosocial adjustment Role of stress on diabetes   Personal strategies to promote health Lifestyle issues that need to be addressed for better diabetes care   Individualized Goals (developed by patient)   Physical Activity Exercise 3-5 times per week;15 minutes per day  increase slowly as tolerated   Medications take my medication as prescribed   Monitoring  test my blood glucose as discussed   Reducing Risk examine blood glucose patterns   Outcomes   Expected Outcomes Demonstrated interest in learning. Expect positive outcomes   Future DMSE 4-6 wks   Program Status Completed      Individualized Plan for Diabetes Self-Management Training:   Learning Objective:  Patient will have a greater understanding of diabetes self-management. Patient education plan is to attend individual and/or group sessions per assessed needs and concerns.   Plan:   Patient Instructions  Discuss alcohol use with your doctor.  Avoid or no mor than 2 per day with food! Sugar free beverages only. Exercise as tolerated.  Increase slowly.  Add back according to MD recommendations.  Start with walking 10 minutes most days of the week. Aim for 3 servings of carbohydrates per meal (45 grams) Read your food labels. Take your medication as prescribed. Check your blood sugar daily.   Expected Outcomes:  Demonstrated interest in learning. Expect positive outcomes  Education  material provided: Living Well with Diabetes, Food label handouts, A1C conversion sheet, Meal plan card, My Plate and Snack sheet, alcohol and diabetes  If problems or questions, patient to contact team via:  Phone  Future DSME appointment: 4-6 wks

## 2015-05-26 NOTE — Patient Instructions (Addendum)
check your blood sugar twice a day.  vary the time of day when you check, between before the 3 meals, and at bedtime.  also check if you have symptoms of your blood sugar being too high or too low.  please keep a record of the readings and bring it to your next appointment here.  You can write it on any piece of paper.  please call us sooner if your blood sugar goes below 70, or if you have a lot of readings over 200.   Please increase the lantus to 65 units each morning. On this type of insulin schedule, you should eat meals on a regular schedule.  If a meal is missed or significantly delayed, your blood sugar could go low.    Please continue the same metformin. Please come back for a follow-up appointment in 2 months.

## 2015-05-26 NOTE — Progress Notes (Signed)
Subjective:    Patient ID: [name] [name], male    DOB: Dec 04, 1955, 59 y.o.   MRN: [id number]  HPI Pt returns for f/u of diabetes mellitus: DM type: Insulin-requiring type 2 Dx'ed: 5053 Complications: CAD and PAD Therapy: insulin since 2011 DKA: never Severe hypoglycemia: never. Pancreatitis: never Other: pt takes qd insulin, due to difficulty remembering multiple daily injections Interval history: he brings a record of his cbg's which i have reviewed today.  It varies from 115-185.  It is in general slightly higher as the day goes on.  He has slight swelling at the insulin injection sites, but no assoc pain.   Past Medical History  Diagnosis Date  . Hypertension   . Gout   . PAF (paroxysmal atrial fibrillation) (HCC)     On coumadin  . LIVER FUNCTION TESTS, ABNORMAL, HX OF 12/30/2006  . Rectal bleeding 12/18/2011    Scheduled for colonoscopy.    [name] HEPATITIS B, CHRONIC 12/30/2006  . HYPERCHOLESTEROLEMIA 07/11/2010  . MITRAL REGURGITATION 12/30/2006  . COLONIC POLYPS, HX OF 12/30/2006  . CAD (coronary artery disease), native coronary artery     a. Nonobstructive CAD by cath 2013 - diffuse distal and branch vessel CAD, no severe disease in the major coronaries, LV mild global hypokinesis, EF 45%. b. ETT-Sestamibi 5/14: EF 31%, small fixed inferior defect with no ischemia.  . Chronic CHF ([place])     a. Mixed ICM/NICM (?EtOH). EF 35% in 2008. Echo 5/13: EF 60-65%, mod LVH, EF 45% on V gram in 12/2011. EF 12/2012: EF 50-55%, mild LVH, inferobasal HK, mild MR. ETT-Ses 5/14 EF 41%. Cardiac MRI 5/14: EF 44%, mild global HK, subepicardial delayed enhancement in nonspecific RV insertion pattern.  . H/O atrial flutter 2007    a. Ablations in 2007, 2008.  [name] History of medication noncompliance   . History of alcohol abuse   . 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 Heart murmur   . Anginal pain ([place])   . Sleep apnea     "suppose to send mask but they never did" (05/03/2015)  . Type  II diabetes mellitus ([place])   . Left sciatic nerve pain since 04/2015  . Anxiety     Past Surgical History  Procedure Laterality Date  . Osteochondroma excision Left 1972    "took bone tumor off my shoulder"  . Coronary angioplasty  12/05/01  . A flutter ablation  2007, 2008    catheter ablation   . Left heart catheterization with coronary angiogram N/A 12/19/2011    Procedure: LEFT HEART CATHETERIZATION WITH CORONARY ANGIOGRAM;  Surgeon: Larey Dresser, MD;  Location[facility] C[facility] LAB;  Service: Cardiovascular;  Laterality: N/A;  . Cardiac catheterization N/A 05/03/2015    Procedure: Right/Left Heart Cath and Coronary Angiography;  Surgeon: Larey Dresser, MD;  Location: [place] CV LAB;  Service: Cardiovascular;  Laterality: N/A;  . Cardiac catheterization N/A 05/03/2015    Procedure: Coronary Stent Intervention;  Surgeon: Jettie Booze, MD;  Location: [place] CV LAB;  Service: Cardiovascular;  Laterality: N/A;    Social History   Social History  . Marital Status: Widowed    Spouse Name: N/A  . Number of Children: 2  . Years of Education: N/A   Occupational History  .     Social History Main Topics  . Smoking status: Former Smoker -- 4.00 packs/day for 18 years    Types: Cigarettes    Quit date: 08/14/1983  . Smokeless tobacco:  Never Used  . Alcohol Use: 9.6 oz/week    10 Shots of liquor, 6 Cans of beer per week     Comment: 05/03/2015 "1-2 beers q 3 days; 4-5 shots of liquor twice/wk"  . Drug Use: Yes    Special: Marijuana     Comment: 05/03/2015 "twice/wk maybe"  . Sexual Activity: No   Other Topics Concern  . Not on file   Social History Narrative    Current Outpatient Prescriptions on File Prior to Visit  Medication Sig Dispense Refill  . amLODipine (NORVASC) 10 MG tablet Take 1 tablet (10 mg total) by mouth daily. 90 tablet 3  . aspirin 81 MG chewable tablet Chew 1 tablet (81 mg total) by mouth daily.    [name] atorvastatin (LIPITOR) 80 MG tablet Take 1 tablet  (80 mg total) by mouth daily. 90 tablet 3  . carvedilol (COREG) 25 MG tablet Take 1 tablet (25 mg total) by mouth 2 (two) times daily with a meal. 180 tablet 3  . clopidogrel (PLAVIX) 75 MG tablet Take 1 tablet (75 mg total) by mouth daily with breakfast. 90 tablet 3  . furosemide (LASIX) 40 MG tablet Take 1 tablet by mouth in the AM and 1/2 tablet (20mg ) by mouth in the PM 45 tablet 1  . hydrALAZINE (APRESOLINE) 50 MG tablet Take 1 tablet (50 mg total) by mouth 3 (three) times daily. 90 tablet 6  . HYDROcodone-acetaminophen (NORCO/VICODIN) 5-325 MG per tablet Take 1 tablet by mouth every 6 (six) hours as needed. (Patient taking differently: Take 1 tablet by mouth every 6 (six) hours as needed (pain). ) 4 tablet 0  . isosorbide mononitrate (IMDUR) 60 MG 24 hr tablet Take 1 tablet (60 mg total) by mouth daily. 90 tablet 3  . losartan (COZAAR) 100 MG tablet Take 1 tablet (100 mg total) by mouth daily. 90 tablet 3  . metFORMIN (GLUCOPHAGE) 1000 MG tablet Take 1 tablet (1,000 mg total) by mouth 2 (two) times daily with a meal. 180 tablet 3  . nitroGLYCERIN (NITROSTAT) 0.4 MG SL tablet Place 1 tablet (0.4 mg total) under the tongue every 5 (five) minutes as needed for chest pain. 25 tablet 3  . pantoprazole (PROTONIX) 40 MG tablet Take 1 tablet (40 mg total) by mouth daily. 90 tablet 3  . spironolactone (ALDACTONE) 25 MG tablet Take 1 tablet (25 mg total) by mouth daily. 30 tablet 1  . warfarin (COUMADIN) 5 MG tablet Take 1 tablet (5 mg total) by mouth as directed. 90 tablet 3  . potassium chloride SA (KLOR-CON M20) 20 MEQ tablet Take 1 tablet (20 mEq total) by mouth daily. (Patient not taking: Reported on 05/26/2015) 30 tablet 3   No current facility-administered medications on file prior to visit.    Allergies  Allergen Reactions  . Other Hives    Patient reports developing hives after receiving "some antibiotic years ago at [facility]". He does not know which antibiotic.  [name] Ace  Inhibitors Cough    Cough    Family History  Problem Relation Age of Onset  . Diabetes Mother   . Hypertension Mother   . Heart attack Neg Hx   . Stroke Neg Hx     BP 146/86 mmHg  Pulse 82  Temp(Src) 98.1 F (36.7 C) (Oral)  Ht 6' (1.829 m)  Wt 213 lb (96.616 kg)  BMI 28.88 kg/m2  SpO2 97%  Review of Systems He denies hypoglycemia and edema.     Objective:  Physical Exam VITAL SIGNS:  See vs page GENERAL: no distress SKIN:  Insulin injection sites at the anterior abdomen are normal, except for small sq nodules.   Lab Results  Component Value Date   HGBA1C 8.5 05/09/2015      Assessment & Plan:  DM: he needs increased rx HTN, new to me: as BP was low-nl at cardiol, today's elevated BP is presumed to be situational.    Patient is advised the following: Patient Instructions  check your blood sugar twice a day.  vary the time of day when you check, between before the 3 meals, and at bedtime.  also check if you have symptoms of your blood sugar being too high or too low.  please keep a record of the readings and bring it to your next appointment here.  You can write it on any piece of paper.  please call us sooner if your blood sugar goes below 70, or if you have a lot of readings over 200.   Please increase the lantus to 65 units each morning. On this type of insulin schedule, you should eat meals on a regular schedule.  If a meal is missed or significantly delayed, your blood sugar could go low.    Please continue the same metformin. Please come back for a follow-up appointment in 2 months.

## 2015-05-26 NOTE — Patient Instructions (Signed)
Discuss alcohol use with your doctor.  Avoid or no mor than 2 per day with food! Sugar free beverages only. Exercise as tolerated.  Increase slowly.  Add back according to MD recommendations.  Start with walking 10 minutes most days of the week. Aim for 3 servings of carbohydrates per meal (45 grams) Read your food labels. Take your medication as prescribed. Check your blood sugar daily.

## 2015-06-01 ENCOUNTER — Telehealth: Payer: Self-pay | Admitting: Endocrinology

## 2015-06-01 ENCOUNTER — Other Ambulatory Visit: Payer: Self-pay | Admitting: Internal Medicine

## 2015-06-01 NOTE — Telephone Encounter (Signed)
Patient need refill of Lantus, Carvedilol, Spironoclone. Send to [place], [place] [phone] (Phone) [phone] (Fax)

## 2015-06-02 ENCOUNTER — Other Ambulatory Visit: Payer: Self-pay | Admitting: *Deleted

## 2015-06-02 DIAGNOSIS — Z794 Long term (current) use of insulin: Principal | ICD-10-CM

## 2015-06-02 DIAGNOSIS — E119 Type 2 diabetes mellitus without complications: Secondary | ICD-10-CM

## 2015-06-02 MED ORDER — INSULIN GLARGINE 100 UNIT/ML ~~LOC~~ SOLN: 65.0000 [IU] | SUBCUTANEOUS | Status: DC

## 2015-06-02 NOTE — Telephone Encounter (Signed)
rx sent for Lantus, Dr. Loanne Drilling does not prescribe the other two medications.

## 2015-06-02 NOTE — Telephone Encounter (Signed)
rx for lantus sent

## 2015-06-04 ENCOUNTER — Emergency Department (HOSPITAL_COMMUNITY): Payer: Medicare Other

## 2015-06-04 ENCOUNTER — Inpatient Hospital Stay (HOSPITAL_COMMUNITY)
Admission: EM | Admit: 2015-06-04 | Discharge: 2015-06-09 | DRG: 445 | Disposition: A | Discharge status: Home / Self Care | Payer: Medicare Other | Attending: Family Medicine | Admitting: Family Medicine

## 2015-06-04 ENCOUNTER — Encounter (HOSPITAL_COMMUNITY): Payer: Self-pay | Admitting: Emergency Medicine

## 2015-06-04 DIAGNOSIS — K760 Fatty (change of) liver, not elsewhere classified: Secondary | ICD-10-CM | POA: Diagnosis present

## 2015-06-04 DIAGNOSIS — E785 Hyperlipidemia, unspecified: Secondary | ICD-10-CM | POA: Diagnosis present

## 2015-06-04 DIAGNOSIS — M109 Gout, unspecified: Secondary | ICD-10-CM | POA: Diagnosis present

## 2015-06-04 DIAGNOSIS — Z86010 Personal history of colonic polyps: Secondary | ICD-10-CM | POA: Diagnosis not present

## 2015-06-04 DIAGNOSIS — R1013 Epigastric pain: Secondary | ICD-10-CM | POA: Diagnosis not present

## 2015-06-04 DIAGNOSIS — I2510 Atherosclerotic heart disease of native coronary artery without angina pectoris: Secondary | ICD-10-CM | POA: Diagnosis not present

## 2015-06-04 DIAGNOSIS — K219 Gastro-esophageal reflux disease without esophagitis: Secondary | ICD-10-CM | POA: Diagnosis present

## 2015-06-04 DIAGNOSIS — G4733 Obstructive sleep apnea (adult) (pediatric): Secondary | ICD-10-CM | POA: Diagnosis present

## 2015-06-04 DIAGNOSIS — Z9861 Coronary angioplasty status: Secondary | ICD-10-CM

## 2015-06-04 DIAGNOSIS — I5042 Chronic combined systolic (congestive) and diastolic (congestive) heart failure: Secondary | ICD-10-CM | POA: Diagnosis present

## 2015-06-04 DIAGNOSIS — I110 Hypertensive heart disease with heart failure: Secondary | ICD-10-CM | POA: Diagnosis present

## 2015-06-04 DIAGNOSIS — Z7902 Long term (current) use of antithrombotics/antiplatelets: Secondary | ICD-10-CM | POA: Diagnosis not present

## 2015-06-04 DIAGNOSIS — R1011 Right upper quadrant pain: Secondary | ICD-10-CM | POA: Diagnosis not present

## 2015-06-04 DIAGNOSIS — N179 Acute kidney failure, unspecified: Secondary | ICD-10-CM | POA: Diagnosis present

## 2015-06-04 DIAGNOSIS — Z5181 Encounter for therapeutic drug level monitoring: Secondary | ICD-10-CM

## 2015-06-04 DIAGNOSIS — K8000 Calculus of gallbladder with acute cholecystitis without obstruction: Secondary | ICD-10-CM | POA: Diagnosis not present

## 2015-06-04 DIAGNOSIS — Z85830 Personal history of malignant neoplasm of bone: Secondary | ICD-10-CM | POA: Diagnosis not present

## 2015-06-04 DIAGNOSIS — Z955 Presence of coronary angioplasty implant and graft: Secondary | ICD-10-CM | POA: Diagnosis not present

## 2015-06-04 DIAGNOSIS — F1010 Alcohol abuse, uncomplicated: Secondary | ICD-10-CM | POA: Diagnosis present

## 2015-06-04 DIAGNOSIS — G4731 Primary central sleep apnea: Secondary | ICD-10-CM | POA: Diagnosis present

## 2015-06-04 DIAGNOSIS — Z7901 Long term (current) use of anticoagulants: Secondary | ICD-10-CM

## 2015-06-04 DIAGNOSIS — I428 Other cardiomyopathies: Secondary | ICD-10-CM | POA: Diagnosis present

## 2015-06-04 DIAGNOSIS — I5032 Chronic diastolic (congestive) heart failure: Secondary | ICD-10-CM | POA: Diagnosis present

## 2015-06-04 DIAGNOSIS — R0602 Shortness of breath: Secondary | ICD-10-CM | POA: Diagnosis not present

## 2015-06-04 DIAGNOSIS — E119 Type 2 diabetes mellitus without complications: Secondary | ICD-10-CM | POA: Diagnosis present

## 2015-06-04 DIAGNOSIS — K8020 Calculus of gallbladder without cholecystitis without obstruction: Secondary | ICD-10-CM | POA: Diagnosis not present

## 2015-06-04 DIAGNOSIS — F1290 Cannabis use, unspecified, uncomplicated: Secondary | ICD-10-CM | POA: Diagnosis present

## 2015-06-04 DIAGNOSIS — K810 Acute cholecystitis: Secondary | ICD-10-CM | POA: Diagnosis not present

## 2015-06-04 DIAGNOSIS — I340 Nonrheumatic mitral (valve) insufficiency: Secondary | ICD-10-CM | POA: Diagnosis present

## 2015-06-04 DIAGNOSIS — I480 Paroxysmal atrial fibrillation: Secondary | ICD-10-CM | POA: Diagnosis present

## 2015-06-04 DIAGNOSIS — I4892 Unspecified atrial flutter: Secondary | ICD-10-CM | POA: Diagnosis present

## 2015-06-04 DIAGNOSIS — B181 Chronic viral hepatitis B without delta-agent: Secondary | ICD-10-CM | POA: Diagnosis present

## 2015-06-04 DIAGNOSIS — Z87891 Personal history of nicotine dependence: Secondary | ICD-10-CM

## 2015-06-04 DIAGNOSIS — I4819 Other persistent atrial fibrillation: Secondary | ICD-10-CM | POA: Diagnosis present

## 2015-06-04 DIAGNOSIS — I10 Essential (primary) hypertension: Secondary | ICD-10-CM | POA: Diagnosis not present

## 2015-06-04 HISTORY — DX: Cholecystitis, unspecified: K81.9

## 2015-06-04 LAB — CBC
HCT: 43.4 % (ref 39.0–52.0)
Hemoglobin: 14.6 g/dL (ref 13.0–17.0)
MCH: 28 pg (ref 26.0–34.0)
MCHC: 33.6 g/dL (ref 30.0–36.0)
MCV: 83.3 fL (ref 78.0–100.0)
Platelets: 134 10*3/uL — ABNORMAL LOW (ref 150–400)
RBC: 5.21 MIL/uL (ref 4.22–5.81)
RDW: 13.5 % (ref 11.5–15.5)
WBC: 6.7 10*3/uL (ref 4.0–10.5)

## 2015-06-04 LAB — BASIC METABOLIC PANEL
Anion gap: 8 (ref 5–15)
BUN: 8 mg/dL (ref 6–20)
CHLORIDE: 100 mmol/L — AB (ref 101–111)
CO2: 26 mmol/L (ref 22–32)
CREATININE: 1.04 mg/dL (ref 0.61–1.24)
Calcium: 9.3 mg/dL (ref 8.9–10.3)
GFR calc non Af Amer: >60 mL/min (ref >60)
Glucose, Bld: 174 mg/dL — ABNORMAL HIGH (ref 65–99)
POTASSIUM: 3.5 mmol/L (ref 3.5–5.1)
SODIUM: 134 mmol/L — AB (ref 135–145)

## 2015-06-04 LAB — HEPATIC FUNCTION PANEL
ALK PHOS: 48 U/L (ref 38–126)
ALT: 14 U/L — ABNORMAL LOW (ref 17–63)
AST: 19 U/L (ref 15–41)
Albumin: 3.8 g/dL (ref 3.5–5.0)
BILIRUBIN TOTAL: 0.5 mg/dL (ref 0.3–1.2)
Total Protein: 7.3 g/dL (ref 6.5–8.1)

## 2015-06-04 LAB — I-STAT TROPONIN, ED
TROPONIN I, POC: 0 ng/mL (ref 0.00–0.08)
TROPONIN I, POC: 0.01 ng/mL (ref 0.00–0.08)

## 2015-06-04 LAB — POC OCCULT BLOOD, ED: FECAL OCCULT BLD: NEGATIVE

## 2015-06-04 LAB — PROTIME-INR
INR: 4.49 — ABNORMAL HIGH (ref 0.00–1.49)
Prothrombin Time: 41.4 seconds — ABNORMAL HIGH (ref 11.6–15.2)

## 2015-06-04 LAB — LIPASE, BLOOD: LIPASE: 30 U/L (ref 11–51)

## 2015-06-04 MED ORDER — HYDROMORPHONE HCL 1 MG/ML IJ SOLN
0.5000 mg | INTRAMUSCULAR | Status: DC | PRN
  Administered 2015-06-04 – 2015-06-06 (×3): 0.5 mg via INTRAVENOUS
  Filled 2015-06-04 (×3): qty 1

## 2015-06-04 MED ORDER — ATORVASTATIN CALCIUM 80 MG PO TABS
80.0000 mg | ORAL_TABLET | Freq: Every day | ORAL | Status: DC
  Administered 2015-06-05 – 2015-06-09 (×5): 80 mg via ORAL
  Filled 2015-06-04 (×6): qty 1

## 2015-06-04 MED ORDER — ADULT MULTIVITAMIN W/MINERALS CH
1.0000 | ORAL_TABLET | Freq: Every day | ORAL | Status: DC
  Administered 2015-06-04 – 2015-06-09 (×5): 1 via ORAL
  Filled 2015-06-04 (×5): qty 1

## 2015-06-04 MED ORDER — DEXTROSE 5 % IV SOLN
2.0000 g | INTRAVENOUS | Status: DC
  Administered 2015-06-04 – 2015-06-05 (×2): 2 g via INTRAVENOUS
  Filled 2015-06-04 (×3): qty 2

## 2015-06-04 MED ORDER — SPIRONOLACTONE 25 MG PO TABS
25.0000 mg | ORAL_TABLET | Freq: Every day | ORAL | Status: DC
  Administered 2015-06-04 – 2015-06-09 (×5): 25 mg via ORAL
  Filled 2015-06-04 (×4): qty 1

## 2015-06-04 MED ORDER — CLOPIDOGREL BISULFATE 75 MG PO TABS
75.0000 mg | ORAL_TABLET | Freq: Every day | ORAL | Status: DC
  Administered 2015-06-05 – 2015-06-09 (×5): 75 mg via ORAL
  Filled 2015-06-04 (×5): qty 1

## 2015-06-04 MED ORDER — ACETAMINOPHEN 325 MG PO TABS
650.0000 mg | ORAL_TABLET | Freq: Four times a day (QID) | ORAL | Status: DC | PRN
  Administered 2015-06-05 – 2015-06-06 (×2): 650 mg via ORAL
  Filled 2015-06-04 (×2): qty 2

## 2015-06-04 MED ORDER — ONDANSETRON HCL 4 MG/2ML IJ SOLN: 4.0000 mg | Freq: Three times a day (TID) | INTRAMUSCULAR | Status: AC | PRN

## 2015-06-04 MED ORDER — LORAZEPAM 1 MG PO TABS: 1.0000 mg | ORAL_TABLET | Freq: Four times a day (QID) | ORAL | Status: AC | PRN

## 2015-06-04 MED ORDER — AMLODIPINE BESYLATE 10 MG PO TABS
10.0000 mg | ORAL_TABLET | Freq: Every day | ORAL | Status: DC
  Administered 2015-06-04 – 2015-06-09 (×5): 10 mg via ORAL
  Filled 2015-06-04 (×5): qty 1

## 2015-06-04 MED ORDER — ACETAMINOPHEN 650 MG RE SUPP: 650.0000 mg | Freq: Four times a day (QID) | RECTAL | Status: DC | PRN

## 2015-06-04 MED ORDER — FOLIC ACID 1 MG PO TABS
1.0000 mg | ORAL_TABLET | Freq: Every day | ORAL | Status: DC
  Administered 2015-06-04 – 2015-06-09 (×5): 1 mg via ORAL
  Filled 2015-06-04 (×5): qty 1

## 2015-06-04 MED ORDER — INSULIN GLARGINE 100 UNIT/ML ~~LOC~~ SOLN
65.0000 [IU] | Freq: Every day | SUBCUTANEOUS | Status: DC
  Administered 2015-06-06 – 2015-06-09 (×3): 65 [IU] via SUBCUTANEOUS
  Filled 2015-06-04 (×6): qty 0.65

## 2015-06-04 MED ORDER — ISOSORBIDE MONONITRATE ER 60 MG PO TB24
60.0000 mg | ORAL_TABLET | Freq: Every day | ORAL | Status: DC
  Administered 2015-06-04 – 2015-06-09 (×6): 60 mg via ORAL
  Filled 2015-06-04 (×6): qty 1

## 2015-06-04 MED ORDER — LORAZEPAM 2 MG/ML IJ SOLN: 1.0000 mg | Freq: Four times a day (QID) | INTRAMUSCULAR | Status: AC | PRN

## 2015-06-04 MED ORDER — PROMETHAZINE HCL 25 MG PO TABS: 12.5000 mg | ORAL_TABLET | Freq: Four times a day (QID) | ORAL | Status: DC | PRN

## 2015-06-04 MED ORDER — SODIUM CHLORIDE 0.9 % IJ SOLN: 3.0000 mL | INTRAMUSCULAR | Status: DC | PRN

## 2015-06-04 MED ORDER — THIAMINE HCL 100 MG/ML IJ SOLN: 100.0000 mg | Freq: Every day | INTRAMUSCULAR | Status: DC

## 2015-06-04 MED ORDER — INSULIN ASPART 100 UNIT/ML ~~LOC~~ SOLN
0.0000 [IU] | Freq: Three times a day (TID) | SUBCUTANEOUS | Status: DC
  Administered 2015-06-05 (×2): 2 [IU] via SUBCUTANEOUS
  Administered 2015-06-06: 1 [IU] via SUBCUTANEOUS
  Administered 2015-06-06: 2 [IU] via SUBCUTANEOUS
  Administered 2015-06-06: 1 [IU] via SUBCUTANEOUS
  Administered 2015-06-08: 5 [IU] via SUBCUTANEOUS
  Administered 2015-06-08 (×2): 1 [IU] via SUBCUTANEOUS
  Administered 2015-06-09: 2 [IU] via SUBCUTANEOUS
  Administered 2015-06-09: 5 [IU] via SUBCUTANEOUS

## 2015-06-04 MED ORDER — POTASSIUM CHLORIDE CRYS ER 20 MEQ PO TBCR
20.0000 meq | EXTENDED_RELEASE_TABLET | Freq: Every day | ORAL | Status: DC
  Administered 2015-06-04 – 2015-06-09 (×5): 20 meq via ORAL
  Filled 2015-06-04 (×5): qty 1

## 2015-06-04 MED ORDER — FUROSEMIDE 40 MG PO TABS
40.0000 mg | ORAL_TABLET | Freq: Two times a day (BID) | ORAL | Status: DC
  Administered 2015-06-06 – 2015-06-08 (×5): 40 mg via ORAL
  Filled 2015-06-04 (×5): qty 1

## 2015-06-04 MED ORDER — MORPHINE SULFATE (PF) 4 MG/ML IV SOLN
4.0000 mg | Freq: Once | INTRAVENOUS | Status: AC
  Administered 2015-06-04: 4 mg via INTRAVENOUS
  Filled 2015-06-04: qty 1

## 2015-06-04 MED ORDER — CARVEDILOL 25 MG PO TABS
25.0000 mg | ORAL_TABLET | Freq: Two times a day (BID) | ORAL | Status: DC
  Administered 2015-06-05 – 2015-06-09 (×10): 25 mg via ORAL
  Filled 2015-06-04 (×11): qty 1

## 2015-06-04 MED ORDER — PANTOPRAZOLE SODIUM 40 MG PO TBEC
40.0000 mg | DELAYED_RELEASE_TABLET | Freq: Every day | ORAL | Status: DC
  Administered 2015-06-05 – 2015-06-09 (×5): 40 mg via ORAL
  Filled 2015-06-04 (×5): qty 1

## 2015-06-04 MED ORDER — ONDANSETRON HCL 4 MG/2ML IJ SOLN
4.0000 mg | Freq: Once | INTRAMUSCULAR | Status: AC | PRN
  Administered 2015-06-04: 4 mg via INTRAVENOUS
  Filled 2015-06-04: qty 2

## 2015-06-04 MED ORDER — HYDROMORPHONE HCL 1 MG/ML IJ SOLN
1.0000 mg | Freq: Once | INTRAMUSCULAR | Status: AC
  Administered 2015-06-04: 1 mg via INTRAVENOUS
  Filled 2015-06-04: qty 1

## 2015-06-04 MED ORDER — VITAMIN B-1 100 MG PO TABS
100.0000 mg | ORAL_TABLET | Freq: Every day | ORAL | Status: DC
Start: 2015-06-04 — End: 2015-06-09
  Administered 2015-06-04 – 2015-06-09 (×5): 100 mg via ORAL
  Filled 2015-06-04 (×5): qty 1

## 2015-06-04 MED ORDER — SODIUM CHLORIDE 0.9 % IV SOLN: 250.0000 mL | INTRAVENOUS | Status: DC | PRN

## 2015-06-04 MED ORDER — SODIUM CHLORIDE 0.9 % IJ SOLN
3.0000 mL | Freq: Two times a day (BID) | INTRAMUSCULAR | Status: DC
  Administered 2015-06-04 – 2015-06-09 (×6): 3 mL via INTRAVENOUS

## 2015-06-04 MED ORDER — NITROGLYCERIN 0.4 MG SL SUBL
0.4000 mg | SUBLINGUAL_TABLET | SUBLINGUAL | Status: DC | PRN
  Administered 2015-06-04: 0.4 mg via SUBLINGUAL
  Filled 2015-06-04: qty 1

## 2015-06-04 MED ORDER — HYDRALAZINE HCL 50 MG PO TABS
50.0000 mg | ORAL_TABLET | Freq: Three times a day (TID) | ORAL | Status: DC
  Administered 2015-06-04 – 2015-06-09 (×12): 50 mg via ORAL
  Filled 2015-06-04 (×14): qty 1

## 2015-06-04 MED ORDER — LOSARTAN POTASSIUM 50 MG PO TABS
100.0000 mg | ORAL_TABLET | Freq: Every day | ORAL | Status: DC
  Administered 2015-06-04 – 2015-06-07 (×4): 100 mg via ORAL
  Filled 2015-06-04 (×3): qty 2

## 2015-06-04 MED ORDER — HYDROMORPHONE HCL 1 MG/ML IJ SOLN: 1.0000 mg | INTRAMUSCULAR | Status: DC | PRN

## 2015-06-04 NOTE — ED Notes (Signed)
Pt. Stated, I started having really bad abdominal pain yesterday with some off and on chest pain.  I've not been having normal bowel movements.

## 2015-06-04 NOTE — H&P (Signed)
[facility] Admission History and Physical Service Pager: [phone]  Patient name: [name] [name] Medical record number: [id number] Date of birth: Jul 20, 1956 Age: 59 y.o. Gender: male  Primary Care Provider: Angelica Chessman, MD Consultants: General Surgery Code Status: Full  Chief Complaint: Abdominal pain   Assessment and Plan: [name] [name] is a 59 y.o. male presenting with Symptomatic Cholelithiasis. PMH is significant for CAD s/p DES x 2 in the RCA (9/16), CHF rEF mixed etiology though most recent Echo EF 55-60%, DMII, HTN, OSA (Needs CPAP device at home), Paroxysmal A Fib (hx of A flutter s/p ablation '07/'08), Chronic Anticoagulation, HLD, Gout, GERD, ? Chronic Hep B.   # RUQ Abd Pain  - Pt. Complaining of RUQ pain x 2 days. IS worse with meals. No alarm symptoms. No fever, no chills. No diarrhea. WBC - 6.7, Bili - 0.1, LFT's otherwise normal, Lipase 30. PT/INR elevated 2/2 coumadin. RUQ U/S with 41mm stone in the neck of the gallbladder otherwise no evidence of cholecystitis. TTP in the RUQ on exam. Pain requiring IV pain medicine for control.  - Admit to tele surgery floor.  - Pt. Seen with surgery.  - Recs AM HIDA scan.   - Empiric Ceftriaxone.  - difficult situation in symptomatic cholelithiasis given recent CAD stenting and need for ongoing anticoagulation.  - Holding coumadin given that INR is 4.4 anyway.  - NPO after midnight for HIDA - Pain control with Dilaudid - Phenergan for nausea.   # CAD s/p DES x 2 in the RCA - Pt. With some "twinges in his left chest radiating down his left arm" in the midst of his RUQ pain. No active chest pain or SOB at this time. He does get SOB with exertion at baseline. No LE edema. No crackles on lung exam. Troponin negative in the ED. EKG with inverted t-wave in V6, but otherwise no acute changes. Has been taking ASA, Plavix, Coumadin. INR 4.4 - says he missed his dose last night, and took coumadin this am.  -  Will get am EKG - Cycle troponins overnight given chest pain, arm symptoms, and recent cardiac stent.  - Continue medical management otherwise.  - Coreg, Imdur, SL nitro prn.  - Continue Plavix.  - Was on ASA with instructions for this to be continued 1 month post-Stenting and stop. Stent placed 9/16. Will hold ASA for now.  - Will let his INR drift down to therapeutic range. No indication to reverse at this time.  - pending results of the HIDA scan, may need to get CARDS to weigh in on surgery.   # CHFrEF : Mixed etiology ETOH? / Ischemic. Last EF noted 55-60% with heart cath. Managed medically as an outpatient. No LE swelling, or Lung exam findings at this time.  - Continue Lasix per home regimen.  - Will monitor fluid status closely.  - I/O's - Daily weights.   # Paroxysmal Afib (Hx of A-Flutter s/p Ablation [id number]); Hx of RVR at one time as well. Normal sinus rhythm here. On coumadin.  - Continue medical management.  - Letting INR drift down.  - Am PT/INR.   # DMII - A1C 8 at last check.  - Continu Lantus 65 u q am - Moderate SSI.  - CBG monitoring.   # HTN - BP elevated here. 164/79 in the ED upon my own check. He says he ran out of 3 of his BP medications and has not had them for 2 days.  - Restart  home meds here.  - Continue to monitor.  - He is on a few anti-htn - Will be careful not to drop him too low.  - Hydralazine, Losartan, Coreg, Amlodipine, Aldactone   # OSA - symptoms include waking up at night gasping for breath, but no particular orthopnea. He says that he is waiting for his machine to be delivered to his house.  - CPAP here. Autotitrate.   # GERD  - continue protonix.   # HLD - continue lipitor  # Hx of ETOH Abuse - says that he has been cutting back on his drinking - says he drinks 1-2 drinks every other day now. Used to drink much more and says he had an ETOH abuse problem. Also MJ, but otherwise no other substances.  - CIWA protocol   # Chronic Hep  B - listed in chart. No labs to support this. Pt. Without knowledge of this diagnosis.  - LFT's appropriate.  - Check Hep panel here.   FEN/GI: Regular Diet / NPO after MN, Protonix Prophylaxis: Anticoagulated.   Disposition: Pending results of further testing.   History of Present Illness:  [name] [name] is a 59 y.o. male presenting with RUQ pain for 2 days. He says that he has had this once before many years ago after her ate at a food truck. Pain is worse with eating. Comes in waves at times. He says that it is not completely relieved with the pain medicine. 1 episode of nausea / vomiting. No diarrhea. No recent weight loss. He complains of back pain that he says is related to his chronic sciatica. He says that he has not had any jaundice recently. No fever, chills. He does endorse pain in his left chest that he describes as "twinges". He is not having them now. They were there when he was having RUQ pain yesterday. He says that he also had a "twinge" in his left arm at that time as well. No diaphoresis. No neck pain. He says his chest pain felt similar to his previous chest pain. He has not had worsening SOB, no LE edema. He does not smoke currently. Quit 30 years ago. He says he drinks ETOH 2 drinks a few times / week. He says he used to abuse ETOH.   Review Of Systems: Per HPI with the following additions: None Otherwise 12 point review of systems was performed and was unremarkable.  Patient Active Problem List   Diagnosis Date Noted  . Cholelithiases 06/04/2015  . Cholelithiasis 06/04/2015  . Stented coronary artery   . Essential hypertension   . Abnormal nuclear stress test 05/03/2015  . Chronic diastolic CHF (congestive heart failure) ([place]) 04/24/2015  . Excessive daytime sleepiness 11/25/2014  . OSA (obstructive sleep apnea) 11/25/2014  . NICM (nonischemic cardiomyopathy) ([place]) 03/11/2014  . Anticoagulation goal of INR 2 to 3 10/19/2013  . Eczema 10/19/2013  . Low libido  10/16/2013  . Paroxysmal atrial fibrillation ([place]) 10/06/2013  . Encounter for therapeutic drug monitoring 09/10/2013  . HTN (hypertension) 07/31/2013  . Long term (current) use of anticoagulants 04/16/2012  . Coronary artery disease 12/28/2011  . Chronic combined systolic and diastolic CHF (congestive heart failure) ([place]) 12/18/2011  . Rectal bleeding 12/18/2011  . CANDIDIASIS 09/27/2010  . HYPERCHOLESTEROLEMIA 07/11/2010  . DENTAL CARIES 06/30/2010  . GOUT 05/05/2009  . Diabetes ([place]) 03/06/2009  . HEPATITIS B, CHRONIC 12/30/2006  . OSTEOCHONDROMA 12/30/2006  . Mitral valve disorder 12/30/2006  . LIVER FUNCTION TESTS, ABNORMAL, HX OF 12/30/2006  .  COLONIC POLYPS, HX OF 12/30/2006  . COLONOSCOPY, HX OF 05/12/2001   Past Medical History: Past Medical History  Diagnosis Date  . Hypertension   . Gout   . PAF (paroxysmal atrial fibrillation) (HCC)     On coumadin  . LIVER FUNCTION TESTS, ABNORMAL, HX OF 12/30/2006  . Rectal bleeding 12/18/2011    Scheduled for colonoscopy.    [name] HEPATITIS B, CHRONIC 12/30/2006  . HYPERCHOLESTEROLEMIA 07/11/2010  . MITRAL REGURGITATION 12/30/2006  . COLONIC POLYPS, HX OF 12/30/2006  . CAD (coronary artery disease), native coronary artery     a. Nonobstructive CAD by cath 2013 - diffuse distal and branch vessel CAD, no severe disease in the major coronaries, LV mild global hypokinesis, EF 45%. b. ETT-Sestamibi 5/14: EF 31%, small fixed inferior defect with no ischemia.  . Chronic CHF ([place])     a. Mixed ICM/NICM (?EtOH). EF 35% in 2008. Echo 5/13: EF 60-65%, mod LVH, EF 45% on V gram in 12/2011. EF 12/2012: EF 50-55%, mild LVH, inferobasal HK, mild MR. ETT-Ses 5/14 EF 41%. Cardiac MRI 5/14: EF 44%, mild global HK, subepicardial delayed enhancement in nonspecific RV insertion pattern.  . H/O atrial flutter 2007    a. Ablations in 2007, 2008.  [name] History of medication noncompliance   . History of alcohol abuse   . 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 Heart murmur   . Anginal pain ([place])   . Sleep apnea     "suppose to send mask but they never did" (05/03/2015)  . Type II diabetes mellitus ([place])   . Left sciatic nerve pain since 04/2015  . Anxiety    Past Surgical History: Past Surgical History  Procedure Laterality Date  . Osteochondroma excision Left 1972    "took bone tumor off my shoulder"  . Coronary angioplasty  12/05/01  . A flutter ablation  2007, 2008    catheter ablation   . Left heart catheterization with coronary angiogram N/A 12/19/2011    Procedure: LEFT HEART CATHETERIZATION WITH CORONARY ANGIOGRAM;  Surgeon: Larey Dresser, MD;  Location: [facility] CATH LAB;  Service: Cardiovascular;  Laterality: N/A;  . Cardiac catheterization N/A 05/03/2015    Procedure: Right/Left Heart Cath and Coronary Angiography;  Surgeon: Larey Dresser, MD;  Location: [place] CV LAB;  Service: Cardiovascular;  Laterality: N/A;  . Cardiac catheterization N/A 05/03/2015    Procedure: Coronary Stent Intervention;  Surgeon: Jettie Booze, MD;  Location: [place] CV LAB;  Service: Cardiovascular;  Laterality: N/A;   Social History: Social History  Substance Use Topics  . Smoking status: Former Smoker -- 4.00 packs/day for 18 years    Types: Cigarettes    Quit date: 08/14/1983  . Smokeless tobacco: Never Used  . Alcohol Use: 9.6 oz/week    10 Shots of liquor, 6 Cans of beer per week     Comment: 05/03/2015 "1-2 beers q 3 days; 4-5 shots of liquor twice/wk"   Additional social history: None.   Please also refer to relevant sections of EMR.  Family History: Family History  Problem Relation Age of Onset  . Diabetes Mother   . Hypertension Mother   . Heart attack Neg Hx   . Stroke Neg Hx    Allergies and Medications: Allergies  Allergen Reactions  . Other Hives    Patient reports developing hives after receiving "some antibiotic given in 1980''s at [facility]". He does not know which antibiotic.  [name] Ace  Inhibitors  Cough   No current facility-administered medications on file prior to encounter.   Current Outpatient Prescriptions on File Prior to Encounter  Medication Sig Dispense Refill  . amLODipine (NORVASC) 10 MG tablet Take 1 tablet (10 mg total) by mouth daily. (Patient taking differently: Take 10 mg by mouth at bedtime. ) 90 tablet 3  . aspirin 81 MG chewable tablet Chew 1 tablet (81 mg total) by mouth daily.    [name] atorvastatin (LIPITOR) 80 MG tablet Take 1 tablet (80 mg total) by mouth daily. 90 tablet 3  . carvedilol (COREG) 25 MG tablet Take 1 tablet (25 mg total) by mouth 2 (two) times daily with a meal. 180 tablet 3  . clopidogrel (PLAVIX) 75 MG tablet Take 1 tablet (75 mg total) by mouth daily with breakfast. 90 tablet 3  . furosemide (LASIX) 40 MG tablet Take 1 tablet by mouth in the AM and 1/2 tablet (20mg ) by mouth in the PM (Patient taking differently: Take 40 mg by mouth 2 (two) times daily. ) 45 tablet 1  . hydrALAZINE (APRESOLINE) 50 MG tablet Take 1 tablet (50 mg total) by mouth 3 (three) times daily. (Patient taking differently: Take 50 mg by mouth 2 (two) times daily. ) 90 tablet 6  . insulin glargine (LANTUS) 100 UNIT/ML injection Inject 0.65 mLs (65 Units total) into the skin every morning. (Patient taking differently: Inject 65 Units into the skin daily. ) 10 mL 3  . isosorbide mononitrate (IMDUR) 60 MG 24 hr tablet Take 1 tablet (60 mg total) by mouth daily. (Patient taking differently: Take 60 mg by mouth at bedtime. ) 90 tablet 3  . losartan (COZAAR) 100 MG tablet Take 1 tablet (100 mg total) by mouth daily. (Patient taking differently: Take 100 mg by mouth at bedtime. ) 90 tablet 3  . metFORMIN (GLUCOPHAGE) 1000 MG tablet Take 1 tablet (1,000 mg total) by mouth 2 (two) times daily with a meal. 180 tablet 3  . nitroGLYCERIN (NITROSTAT) 0.4 MG SL tablet Place 1 tablet (0.4 mg total) under the tongue every 5 (five) minutes as needed for chest pain. 25 tablet 3  .  spironolactone (ALDACTONE) 25 MG tablet Take 1 tablet (25 mg total) by mouth daily. (Patient taking differently: Take 25 mg by mouth at bedtime. ) 30 tablet 1  . warfarin (COUMADIN) 5 MG tablet Take 1 tablet (5 mg total) by mouth as directed. (Patient taking differently: Take 2.5-5 mg by mouth at bedtime. Take 1/2 tablet (2.5 mg) by mouth on Wednesday, take 1 tablet (5 mg) on Sunday, Monday, Tuesday, Thursday, Friday, Saturday) 90 tablet 3  . HYDROcodone-acetaminophen (NORCO/VICODIN) 5-325 MG per tablet Take 1 tablet by mouth every 6 (six) hours as needed. (Patient not taking: Reported on 06/04/2015) 4 tablet 0  . pantoprazole (PROTONIX) 40 MG tablet Take 1 tablet (40 mg total) by mouth daily. (Patient not taking: Reported on 06/04/2015) 90 tablet 3  . potassium chloride SA (KLOR-CON M20) 20 MEQ tablet Take 1 tablet (20 mEq total) by mouth daily. (Patient not taking: Reported on 05/26/2015) 30 tablet 3    Objective: BP 169/88 mmHg  Pulse 93  Temp(Src) 98.4 F (36.9 C) (Oral)  Resp 18  Ht 6' (1.829 m)  Wt 212 lb (96.163 kg)  BMI 28.75 kg/m2  SpO2 98% Exam: General: NAD, AAOx3, resting comfortably in bed.  Eyes: EOMI, PERRLA ENTM: Nares patent, O/P clear.  Neck: FROM, supple.  Cardiovascular: RRR, no MGR, normal S1/S2 Respiratory: CTA bilaterally, appropriate rate, unlabored.  Abdomen: in conjunction with surgery - RUQ tenderness, otherwise no peritoneal signs or focal findings.  MSK: MAEW, no LE edema, 2+ distal pulses.  Skin: No rashes, no lesions.  Neuro: No focal deficits.  Psych: Appropriate mood / affect.   Labs and Imaging: CBC BMET   Recent Labs Lab 06/04/15 1438  WBC 6.7  HGB 14.6  HCT 43.4  PLT 134*    Recent Labs Lab 06/04/15 1438  NA 134*  K 3.5  CL 100*  CO2 26  BUN 8  CREATININE 1.04  GLUCOSE 174*  CALCIUM 9.3     Lipase - 30 AST 19 ALT 14 Bilirubin 0.1  Troponin - 0.0  PT 41.4 INR - 4.49  CXR:  IMPRESSION: Enlargement of cardiac  silhouette.  Chronic bronchitic changes and chronic accentuation of medial RIGHT lung base markings without acute infiltrate.  RUQ Ultrasound:  IMPRESSION: Stable 3 cm complex cyst seen in upper pole of left kidney.  Cholelithiasis is noted without definite evidence cholecystitis.  Increased echogenicity of hepatic parenchyma is noted suggesting fatty infiltration or other diffuse hepatocellular disease.  EKG - Twave inversion V6, otherwise no changes.    Aquilla Hacker, MD 06/04/2015, 10:13 PM PGY-2, [facility] pager: [phone], text pages welcome

## 2015-06-04 NOTE — ED Notes (Signed)
Nurse unable to take report - she is to call back

## 2015-06-04 NOTE — Progress Notes (Addendum)
ANTIBIOTIC CONSULT NOTE - INITIAL  Pharmacy Consult for ceftriaxone Indication: intra-abdominal infection  Allergies  Allergen Reactions  . Other Hives    Patient reports developing hives after receiving "some antibiotic given in 1980''s at [facility]". He does not know which antibiotic.  [name] Ace Inhibitors Cough    Patient Measurements: Height: 6' (182.9 cm) Weight: 212 lb (96.163 kg) IBW/kg (Calculated) : 77.6 Adjusted Body Weight:   Vital Signs: Temp: 98.4 F (36.9 C) (10/22 2128) Temp Source: Oral (10/22 2128) BP: 169/88 mmHg (10/22 2128) Pulse Rate: 93 (10/22 2128) Intake/Output from previous day:   Intake/Output from this shift: Total I/O In: -  Out: 525 [Urine:525]  Labs:  Recent Labs  06/04/15 1438  WBC 6.7  HGB 14.6  PLT 134*  CREATININE 1.04   Estimated Creatinine Clearance: 89.7 mL/min (by C-G formula based on Cr of 1.04). No results for input(s): VANCOTROUGH, VANCOPEAK, VANCORANDOM, GENTTROUGH, GENTPEAK, GENTRANDOM, TOBRATROUGH, TOBRAPEAK, TOBRARND, AMIKACINPEAK, AMIKACINTROU, AMIKACIN in the last 72 hours.   Microbiology: No results found for this or any previous visit (from the past 720 hour(s)).  Medical History: Past Medical History  Diagnosis Date  . Hypertension   . Gout   . PAF (paroxysmal atrial fibrillation) (HCC)     On coumadin  . LIVER FUNCTION TESTS, ABNORMAL, HX OF 12/30/2006  . Rectal bleeding 12/18/2011    Scheduled for colonoscopy.    [name] HEPATITIS B, CHRONIC 12/30/2006  . HYPERCHOLESTEROLEMIA 07/11/2010  . MITRAL REGURGITATION 12/30/2006  . COLONIC POLYPS, HX OF 12/30/2006  . CAD (coronary artery disease), native coronary artery     a. Nonobstructive CAD by cath 2013 - diffuse distal and branch vessel CAD, no severe disease in the major coronaries, LV mild global hypokinesis, EF 45%. b. ETT-Sestamibi 5/14: EF 31%, small fixed inferior defect with no ischemia.  . Chronic CHF ([place])     a. Mixed ICM/NICM (?EtOH). EF 35% in  2008. Echo 5/13: EF 60-65%, mod LVH, EF 45% on V gram in 12/2011. EF 12/2012: EF 50-55%, mild LVH, inferobasal HK, mild MR. ETT-Ses 5/14 EF 41%. Cardiac MRI 5/14: EF 44%, mild global HK, subepicardial delayed enhancement in nonspecific RV insertion pattern.  . H/O atrial flutter 2007    a. Ablations in 2007, 2008.  [name] History of medication noncompliance   . History of alcohol abuse   . 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 Heart murmur   . Anginal pain ([place])   . Sleep apnea     "suppose to send mask but they never did" (05/03/2015)  . Type II diabetes mellitus ([place])   . Left sciatic nerve pain since 04/2015  . Anxiety     Medications:  Scheduled:  . amLODipine  10 mg Oral Daily  . [START ON 06/05/2015] atorvastatin  80 mg Oral q1800  . [START ON 06/05/2015] carvedilol  25 mg Oral BID WC  . [START ON 06/05/2015] clopidogrel  75 mg Oral Q breakfast  . folic acid  1 mg Oral Daily  . [START ON 06/05/2015] furosemide  40 mg Oral BID  . hydrALAZINE  50 mg Oral TID  . [START ON 06/05/2015] insulin aspart  0-9 Units Subcutaneous TID WC  . [START ON 06/05/2015] insulin glargine  65 Units Subcutaneous Daily  . isosorbide mononitrate  60 mg Oral Daily  . losartan  100 mg Oral Daily  . multivitamin with minerals  1 tablet Oral Daily  . pantoprazole  40 mg Oral Daily  .  potassium chloride SA  20 mEq Oral Daily  . sodium chloride  3 mL Intravenous Q12H  . spironolactone  25 mg Oral Daily  . thiamine  100 mg Oral Daily   Or  . thiamine  100 mg Intravenous Daily   Infusions:   Assessment: 59 yo male with intraabdominal infection will be started on ceftriaxone.    Goal of Therapy:  Resolution of infection  Plan:  - Ceftraixone 2 g iv q24h. Pharmacy will sign off  Aidynn Polendo, Tsz-Yin 06/04/2015,10:21 PM

## 2015-06-04 NOTE — Consult Note (Signed)
Reason for Consult:abd pain Referring Physician: Dr Denyse Amass [name] [name] is an 59 y.o. male.  HPI: 59yo AAM with OSA, DM, HTN, CM, CAD s/p DES x 2 04/2015 comes to ed c/o persistent constant RUQ pain. Started somewhat last night but really 'severe' this am and persistent so came to ED. Had nausea. Threw up when got to ED. No f/c/wt loss/jaundice. Reports remote prior episode but less intense and did not last as long - happened after eating food from food truck. U/s showed gallstones but no cholecystitis. Labs normal except for INR. Pain has persisted and requiring ongoing pain meds so ED called [place] to evaluate. Pt states he forgot to take his coumadin last night but took it this am but later threw up when got to ED  Of note pt had recent DES placed on 9/20. On plavix, asa, coumadin  Past Medical History  Diagnosis Date  . Hypertension   . Gout   . PAF (paroxysmal atrial fibrillation) (HCC)     On coumadin  . LIVER FUNCTION TESTS, ABNORMAL, HX OF 12/30/2006  . Rectal bleeding 12/18/2011    Scheduled for colonoscopy.    [name] HEPATITIS B, CHRONIC 12/30/2006  . HYPERCHOLESTEROLEMIA 07/11/2010  . MITRAL REGURGITATION 12/30/2006  . COLONIC POLYPS, HX OF 12/30/2006  . CAD (coronary artery disease), native coronary artery     a. Nonobstructive CAD by cath 2013 - diffuse distal and branch vessel CAD, no severe disease in the major coronaries, LV mild global hypokinesis, EF 45%. b. ETT-Sestamibi 5/14: EF 31%, small fixed inferior defect with no ischemia.  . Chronic CHF ([place])     a. Mixed ICM/NICM (?EtOH). EF 35% in 2008. Echo 5/13: EF 60-65%, mod LVH, EF 45% on V gram in 12/2011. EF 12/2012: EF 50-55%, mild LVH, inferobasal HK, mild MR. ETT-Ses 5/14 EF 41%. Cardiac MRI 5/14: EF 44%, mild global HK, subepicardial delayed enhancement in nonspecific RV insertion pattern.  . H/O atrial flutter 2007    a. Ablations in 2007, 2008.  [name] History of medication noncompliance   . History of alcohol abuse   .  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 Heart murmur   . Anginal pain ([place])   . Sleep apnea     "suppose to send mask but they never did" (05/03/2015)  . Type II diabetes mellitus ([place])   . Left sciatic nerve pain since 04/2015  . Anxiety     Past Surgical History  Procedure Laterality Date  . Osteochondroma excision Left 1972    "took bone tumor off my shoulder"  . Coronary angioplasty  12/05/01  . A flutter ablation  2007, 2008    catheter ablation   . Left heart catheterization with coronary angiogram N/A 12/19/2011    Procedure: LEFT HEART CATHETERIZATION WITH CORONARY ANGIOGRAM;  Surgeon: Larey Dresser, MD;  Location: [facility] CATH LAB;  Service: Cardiovascular;  Laterality: N/A;  . Cardiac catheterization N/A 05/03/2015    Procedure: Right/Left Heart Cath and Coronary Angiography;  Surgeon: Larey Dresser, MD;  Location: [place] CV LAB;  Service: Cardiovascular;  Laterality: N/A;  . Cardiac catheterization N/A 05/03/2015    Procedure: Coronary Stent Intervention;  Surgeon: Jettie Booze, MD;  Location: [place] CV LAB;  Service: Cardiovascular;  Laterality: N/A;    Family History  Problem Relation Age of Onset  . Diabetes Mother   . Hypertension Mother   . Heart attack Neg Hx   . Stroke Neg Hx  Social History:  reports that he quit smoking about 31 years ago. His smoking use included Cigarettes. He has a 72 pack-year smoking history. He has never used smokeless tobacco. He reports that he drinks about 9.6 oz of alcohol per week. He reports that he uses illicit drugs (Marijuana).  Allergies:  Allergies  Allergen Reactions  . Other Hives    Patient reports developing hives after receiving "some antibiotic given in 1980''s at [facility]". He does not know which antibiotic.  [name] Ace Inhibitors Cough    Medications: I have reviewed the patient's current medications.  Results for orders placed or performed during the hospital encounter of  06/04/15 (from the past 48 hour(s))  Basic metabolic panel     Status: Abnormal   Collection Time: 06/04/15  2:38 PM  Result Value Ref Range   Sodium 134 (L) 135 - 145 mmol/L   Potassium 3.5 3.5 - 5.1 mmol/L   Chloride 100 (L) 101 - 111 mmol/L   CO2 26 22 - 32 mmol/L   Glucose, Bld 174 (H) 65 - 99 mg/dL   BUN 8 6 - 20 mg/dL   Creatinine, Ser 1.04 0.61 - 1.24 mg/dL   Calcium 9.3 8.9 - 10.3 mg/dL   GFR calc non Af Amer >60 >60 mL/min   GFR calc Af Amer >60 >60 mL/min    Comment: (NOTE) The eGFR has been calculated using the CKD EPI equation. This calculation has not been validated in all clinical situations. eGFR's persistently <60 mL/min signify possible Chronic Kidney Disease.    Anion gap 8 5 - 15  CBC     Status: Abnormal   Collection Time: 06/04/15  2:38 PM  Result Value Ref Range   WBC 6.7 4.0 - 10.5 K/uL   RBC 5.21 4.22 - 5.81 MIL/uL   Hemoglobin 14.6 13.0 - 17.0 g/dL   HCT 43.4 39.0 - 52.0 %   MCV 83.3 78.0 - 100.0 fL   MCH 28.0 26.0 - 34.0 pg   MCHC 33.6 30.0 - 36.0 g/dL   RDW 13.5 11.5 - 15.5 %   Platelets 134 (L) 150 - 400 K/uL  Lipase, blood     Status: None   Collection Time: 06/04/15  2:38 PM  Result Value Ref Range   Lipase 30 11 - 51 U/L    Comment: Please note change in reference range.  I-stat troponin, ED     Status: None   Collection Time: 06/04/15  2:45 PM  Result Value Ref Range   Troponin i, poc 0.00 0.00 - 0.08 ng/mL   Comment 3            Comment: Due to the release kinetics of cTnI, a negative result within the first hours of the onset of symptoms does not rule out myocardial infarction with certainty. If myocardial infarction is still suspected, repeat the test at appropriate intervals.   Protime-INR     Status: Abnormal   Collection Time: 06/04/15  3:59 PM  Result Value Ref Range   Prothrombin Time 41.4 (H) 11.6 - 15.2 seconds   INR 4.49 (H) 0.00 - 1.49  Hepatic function panel     Status: Abnormal   Collection Time: 06/04/15  4:12 PM    Result Value Ref Range   Total Protein 7.3 6.5 - 8.1 g/dL   Albumin 3.8 3.5 - 5.0 g/dL   AST 19 15 - 41 U/L   ALT 14 (L) 17 - 63 U/L   Alkaline Phosphatase 48  38 - 126 U/L   Total Bilirubin 0.5 0.3 - 1.2 mg/dL   Bilirubin, Direct <0.1 (L) 0.1 - 0.5 mg/dL   Indirect Bilirubin NOT CALCULATED 0.3 - 0.9 mg/dL  POC occult blood, ED Provider will collect     Status: None   Collection Time: 06/04/15  4:20 PM  Result Value Ref Range   Fecal Occult Bld NEGATIVE NEGATIVE  I-stat troponin, ED     Status: None   Collection Time: 06/04/15  6:41 PM  Result Value Ref Range   Troponin i, poc 0.01 0.00 - 0.08 ng/mL   Comment 3            Comment: Due to the release kinetics of cTnI, a negative result within the first hours of the onset of symptoms does not rule out myocardial infarction with certainty. If myocardial infarction is still suspected, repeat the test at appropriate intervals.     Dg Chest 2 View  06/04/2015  CLINICAL DATA:  Intermittent chest pain and shortness of breath for several days, former smoker, type II diabetes mellitus, CHF, hypertension, history osteochondrosarcoma EXAM: CHEST  2 VIEW COMPARISON:  10/05/2013 FINDINGS: Enlargement of cardiac silhouette. Elongation of thoracic aorta. Mediastinal contours and pulmonary vascularity normal. Peribronchial thickening with an chronic accentuation of medial RIGHT lung base markings. No definite acute infiltrate, pleural effusion or pneumothorax. No acute osseous findings. IMPRESSION: Enlargement of cardiac silhouette. Chronic bronchitic changes and chronic accentuation of medial RIGHT lung base markings without acute infiltrate. Electronically Signed   By: Lavonia Dana M.D.   On: 06/04/2015 15:42   US Abdomen Complete  06/04/2015  CLINICAL DATA:  Acute right upper quadrant abdominal pain. EXAM: ULTRASOUND ABDOMEN COMPLETE COMPARISON:  Ultrasound of November 13, 2013. FINDINGS: Gallbladder: Multiple gallstones are noted, with the largest  measuring 2.7 cm. 8 mm gallstone is lodged in the neck of the gallbladder. No gallbladder wall thickening or pericholecystic fluid is noted. No sonographic Murphy's sign is noted. Common bile duct: Diameter: 4.9 mm which is within normal limits. Liver: Increased echogenicity is noted suggesting fatty infiltration. No focal abnormality is noted. IVC: No abnormality visualized. Pancreas: Visualized portion unremarkable. Spleen: Size and appearance within normal limits. Right Kidney: Length: 12.7 cm. Echogenicity within normal limits. No mass or hydronephrosis visualized. Left Kidney: Length: 12.6 cm. Echogenicity within normal limits. No hydronephrosis visualized. 3 cm complex cyst is seen in upper pole which is grossly stable compared to prior exam. Abdominal aorta: No aneurysm visualized. Other findings: None. IMPRESSION: Stable 3 cm complex cyst seen in upper pole of left kidney. Cholelithiasis is noted without definite evidence cholecystitis. Increased echogenicity of hepatic parenchyma is noted suggesting fatty infiltration or other diffuse hepatocellular disease. Electronically Signed   By: Marijo Conception, M.D.   On: 06/04/2015 18:23    Review of Systems  Constitutional: Negative for fever, chills and weight loss.  HENT: Negative for nosebleeds.   Eyes: Negative for blurred vision.  Respiratory: Negative for shortness of breath.        Has OSA but not on CPAP - "hasn't been ordered"  Cardiovascular: Positive for chest pain and PND. Negative for palpitations and orthopnea.       + DOE; states he has chronic DOE and occasional PND. No worse over the past month. Hunches over a shopping cart when shopping and has to stop due to fatigue/sob. Sleeps on 2 pillows for comfort  Gastrointestinal: Positive for nausea, vomiting and abdominal pain. Negative for diarrhea and constipation.  Genitourinary: Negative  for dysuria and hematuria.  Musculoskeletal: Negative.   Skin: Negative for itching and rash.    Neurological: Negative for dizziness, focal weakness, seizures, loss of consciousness and headaches.       Denies TIAs, amaurosis fugax  Endo/Heme/Allergies: Does not bruise/bleed easily.  Psychiatric/Behavioral: The patient is not nervous/anxious.    Blood pressure 154/93, pulse 82, resp. rate 10, SpO2 97 %. Physical Exam  Vitals reviewed. Constitutional: He is oriented to person, place, and time. He appears well-developed and well-nourished. No distress.  Obese AAM resting comfortably. Nontoxic.   HENT:  Head: Normocephalic and atraumatic.  Right Ear: External ear normal.  Left Ear: External ear normal.  Eyes: Conjunctivae are normal. No scleral icterus.  Neck: Normal range of motion. Neck supple. No tracheal deviation present.  Cardiovascular: Normal rate and normal heart sounds.   Respiratory: Effort normal and breath sounds normal. No stridor. No respiratory distress. He has no wheezes.  GI: Soft. He exhibits no distension. There is tenderness. There is no rebound and no guarding.    TTP lateral RUQ  Musculoskeletal: He exhibits no edema or tenderness.  Neurological: He is alert and oriented to person, place, and time. He exhibits normal muscle tone.  Skin: Skin is warm and dry. No rash noted. He is not diaphoretic. No erythema. No pallor.  Trace to no LE edema  Psychiatric: He has a normal mood and affect. His behavior is normal. Judgment and thought content normal.    Assessment/Plan: RUQ pain Cholelithiasis Fatty liver PAF OSA DM Diffuse CAD s/p recent LHC/RHC with DES placement x 2 to RCA 9/20 Cardiomyopathy Chronic anticoagulation - supratherapeutic  rec HIDA scan (nuclear medicine scan) of his gallbladder in am to determine if he has cholecystitis (currently no fever, no wbc, no wall thickening, no pericholecystic fluid but has ongoing discomfort) -if found to have cholecystitis, highly doubt surgery can be performed given recent placement of DES and need for  ongoing antiplt therapy. If cholecystitis confirmed - will need card consult to weigh in  -would not give coumadin since supratherapeutic.  -empiric abx (rocephin) -will follow.   Leighton Ruff. Redmond Pulling, MD, FACS General, Bariatric, & Minimally Invasive Surgery [facility], [place]    [facility] M 06/04/2015, 8:21 PM

## 2015-06-04 NOTE — ED Provider Notes (Signed)
CSN: [id number]     Arrival date & time 06/04/15  1423 History   First MD Initiated Contact with Patient 06/04/15 1502     Chief Complaint  Patient presents with  . Chest Pain  . Abdominal Pain     (Consider location/radiation/quality/duration/timing/severity/associated sxs/prior Treatment) Patient is a 59 y.o. male presenting with abdominal pain. The history is provided by the patient and medical records.  Abdominal Pain Pain location:  Epigastric, periumbilical and RUQ Pain quality: aching and sharp   Pain radiates to:  Periumbilical region Pain severity:  Severe Onset quality:  Gradual Duration:  2 days Timing:  Constant Progression:  Worsening Chronicity:  New Context: eating, previous surgery and recent illness   Context: not sick contacts, not suspicious food intake and not trauma   Relieved by:  None tried Worsened by:  Palpation Ineffective treatments:  Lying down Associated symptoms: chest pain and nausea   Associated symptoms: no dysuria, no fever and no shortness of breath   Risk factors: alcohol abuse, obesity and recent hospitalization     Past Medical History  Diagnosis Date  . Hypertension   . Gout   . PAF (paroxysmal atrial fibrillation) (HCC)     On coumadin  . LIVER FUNCTION TESTS, ABNORMAL, HX OF 12/30/2006  . Rectal bleeding 12/18/2011    Scheduled for colonoscopy.    [name] HEPATITIS B, CHRONIC 12/30/2006  . HYPERCHOLESTEROLEMIA 07/11/2010  . MITRAL REGURGITATION 12/30/2006  . COLONIC POLYPS, HX OF 12/30/2006  . CAD (coronary artery disease), native coronary artery     a. Nonobstructive CAD by cath 2013 - diffuse distal and branch vessel CAD, no severe disease in the major coronaries, LV mild global hypokinesis, EF 45%. b. ETT-Sestamibi 5/14: EF 31%, small fixed inferior defect with no ischemia.  . Chronic CHF ([place])     a. Mixed ICM/NICM (?EtOH). EF 35% in 2008. Echo 5/13: EF 60-65%, mod LVH, EF 45% on V gram in 12/2011. EF 12/2012: EF 50-55%, mild LVH,  inferobasal HK, mild MR. ETT-Ses 5/14 EF 41%. Cardiac MRI 5/14: EF 44%, mild global HK, subepicardial delayed enhancement in nonspecific RV insertion pattern.  . H/O atrial flutter 2007    a. Ablations in 2007, 2008.  [name] History of medication noncompliance   . History of alcohol abuse   . 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 Heart murmur   . Anginal pain ([place])   . Sleep apnea     "suppose to send mask but they never did" (05/03/2015)  . Type II diabetes mellitus ([place])   . Left sciatic nerve pain since 04/2015  . Anxiety    Past Surgical History  Procedure Laterality Date  . Osteochondroma excision Left 1972    "took bone tumor off my shoulder"  . Coronary angioplasty  12/05/01  . A flutter ablation  2007, 2008    catheter ablation   . Left heart catheterization with coronary angiogram N/A 12/19/2011    Procedure: LEFT HEART CATHETERIZATION WITH CORONARY ANGIOGRAM;  Surgeon: Larey Dresser, MD;  Location: [facility] CATH LAB;  Service: Cardiovascular;  Laterality: N/A;  . Cardiac catheterization N/A 05/03/2015    Procedure: Right/Left Heart Cath and Coronary Angiography;  Surgeon: Larey Dresser, MD;  Location: [facility] CV LAB;  Service: Cardiovascular;  Laterality: N/A;  . Cardiac catheterization N/A 05/03/2015    Procedure: Coronary Stent Intervention;  Surgeon: Jettie Booze, MD;  Location: [facility] CV LAB;  Service: Cardiovascular;  Laterality: N/A;  Family History  Problem Relation Age of Onset  . Diabetes Mother   . Hypertension Mother   . Heart attack Neg Hx   . Stroke Neg Hx    Social History  Substance Use Topics  . Smoking status: Former Smoker -- 4.00 packs/day for 18 years    Types: Cigarettes    Quit date: 08/14/1983  . Smokeless tobacco: Never Used  . Alcohol Use: 9.6 oz/week    10 Shots of liquor, 6 Cans of beer per week     Comment: 05/03/2015 "1-2 beers q 3 days; 4-5 shots of liquor twice/wk"    Review of Systems  Constitutional: Negative  for fever.  HENT: Negative for facial swelling.   Respiratory: Negative for shortness of breath.   Cardiovascular: Positive for chest pain.  Gastrointestinal: Positive for nausea and abdominal pain.  Genitourinary: Negative for dysuria.  Musculoskeletal: Negative for back pain.  Skin: Negative for rash.  Neurological: Negative for headaches.  Psychiatric/Behavioral: Negative for confusion.      Allergies  Other and Ace inhibitors  Home Medications   Prior to Admission medications   Medication Sig Start Date End Date Taking? Authorizing Provider  acetaminophen (TYLENOL) 500 MG tablet Take 1,000 mg by mouth every 6 (six) hours as needed (pain).   Yes Historical Provider, MD  amLODipine (NORVASC) 10 MG tablet Take 1 tablet (10 mg total) by mouth daily. Patient taking differently: Take 10 mg by mouth at bedtime.  05/18/14  Yes Tresa Garter, MD  aspirin 81 MG chewable tablet Chew 1 tablet (81 mg total) by mouth daily. 05/04/15  Yes Almyra Deforest, PA  atorvastatin (LIPITOR) 80 MG tablet Take 1 tablet (80 mg total) by mouth daily. 04/29/15  Yes Larey Dresser, MD  bismuth subsalicylate (PEPTO BISMOL) 262 MG/15ML suspension Take 30 mLs by mouth every 6 (six) hours as needed for indigestion (stomach pain).   Yes Historical Provider, MD  carvedilol (COREG) 25 MG tablet Take 1 tablet (25 mg total) by mouth 2 (two) times daily with a meal. 05/18/14  Yes Tresa Garter, MD  clopidogrel (PLAVIX) 75 MG tablet Take 1 tablet (75 mg total) by mouth daily with breakfast. 05/04/15  Yes Almyra Deforest, PA  furosemide (LASIX) 40 MG tablet Take 1 tablet by mouth in the AM and 1/2 tablet (20mg ) by mouth in the PM Patient taking differently: Take 40 mg by mouth 2 (two) times daily.  04/22/15  Yes Larey Dresser, MD  hydrALAZINE (APRESOLINE) 50 MG tablet Take 1 tablet (50 mg total) by mouth 3 (three) times daily. Patient taking differently: Take 50 mg by mouth 2 (two) times daily.  04/22/15  Yes Larey Dresser, MD   insulin glargine (LANTUS) 100 UNIT/ML injection Inject 0.65 mLs (65 Units total) into the skin every morning. Patient taking differently: Inject 65 Units into the skin daily.  06/02/15  Yes Renato Shin, MD  isosorbide mononitrate (IMDUR) 60 MG 24 hr tablet Take 1 tablet (60 mg total) by mouth daily. Patient taking differently: Take 60 mg by mouth at bedtime.  05/18/14  Yes Tresa Garter, MD  losartan (COZAAR) 100 MG tablet Take 1 tablet (100 mg total) by mouth daily. Patient taking differently: Take 100 mg by mouth at bedtime.  05/18/14  Yes Tresa Garter, MD  metFORMIN (GLUCOPHAGE) 1000 MG tablet Take 1 tablet (1,000 mg total) by mouth 2 (two) times daily with a meal. 05/18/14  Yes Tresa Garter, MD  nitroGLYCERIN (NITROSTAT) 0.4 MG  SL tablet Place 1 tablet (0.4 mg total) under the tongue every 5 (five) minutes as needed for chest pain. 05/04/15  Yes Almyra Deforest, PA  spironolactone (ALDACTONE) 25 MG tablet Take 1 tablet (25 mg total) by mouth daily. Patient taking differently: Take 25 mg by mouth at bedtime.  04/22/15  Yes Larey Dresser, MD  warfarin (COUMADIN) 5 MG tablet Take 1 tablet (5 mg total) by mouth as directed. Patient taking differently: Take 2.5-5 mg by mouth at bedtime. Take 1/2 tablet (2.5 mg) by mouth on Wednesday, take 1 tablet (5 mg) on Sunday, Monday, Tuesday, Thursday, Friday, Saturday 05/18/14  Yes Tresa Garter, MD  HYDROcodone-acetaminophen (NORCO/VICODIN) 5-325 MG per tablet Take 1 tablet by mouth every 6 (six) hours as needed. Patient not taking: Reported on 06/04/2015 12/14/14   Junius Creamer, NP  pantoprazole (PROTONIX) 40 MG tablet Take 1 tablet (40 mg total) by mouth daily. Patient not taking: Reported on 06/04/2015 05/04/15   Almyra Deforest, PA  potassium chloride SA (KLOR-CON M20) 20 MEQ tablet Take 1 tablet (20 mEq total) by mouth daily. Patient not taking: Reported on 05/26/2015 04/22/15   Larey Dresser, MD   BP 192/108 mmHg  Pulse 90  Resp 28  SpO2  100% Physical Exam  Constitutional: He is oriented to person, place, and time. He appears well-developed and well-nourished. He appears distressed.  HENT:  Head: Normocephalic and atraumatic.  Right Ear: External ear normal.  Left Ear: External ear normal.  Nose: Nose normal.  Mouth/Throat: Oropharynx is clear and moist. No oropharyngeal exudate.  Eyes: Conjunctivae and EOM are normal. Pupils are equal, round, and reactive to light. Right eye exhibits no discharge. Left eye exhibits no discharge. No scleral icterus.  Neck: Normal range of motion. Neck supple. No JVD present. No tracheal deviation present. No thyromegaly present.  Cardiovascular: Normal rate, regular rhythm and intact distal pulses.   Pulmonary/Chest: Effort normal. No stridor. No respiratory distress.  Abdominal: Soft. He exhibits no distension. There is tenderness in the right upper quadrant and epigastric area. There is positive Murphy's sign.  Musculoskeletal: Normal range of motion. He exhibits no edema or tenderness.  Lymphadenopathy:    He has no cervical adenopathy.  Neurological: He is alert and oriented to person, place, and time.  Skin: Skin is warm and dry. No rash noted. He is not diaphoretic. No erythema. No pallor.  Psychiatric: He has a normal mood and affect. His behavior is normal. Judgment and thought content normal.  Nursing note and vitals reviewed.   ED Course  Procedures (including critical care time) Labs Review Labs Reviewed  BASIC METABOLIC PANEL - Abnormal; Notable for the following:    Sodium 134 (*)    Chloride 100 (*)    Glucose, Bld 174 (*)    All other components within normal limits  CBC - Abnormal; Notable for the following:    Platelets 134 (*)    All other components within normal limits  PROTIME-INR - Abnormal; Notable for the following:    Prothrombin Time 41.4 (*)    INR 4.49 (*)    All other components within normal limits  HEPATIC FUNCTION PANEL - Abnormal; Notable for  the following:    ALT 14 (*)    Bilirubin, Direct <0.1 (*)    All other components within normal limits  MAGNESIUM - Abnormal; Notable for the following:    Magnesium 1.6 (*)    All other components within normal limits  BASIC METABOLIC PANEL -  Abnormal; Notable for the following:    Sodium 133 (*)    Chloride 100 (*)    Glucose, Bld 168 (*)    Calcium 8.7 (*)    All other components within normal limits  CBC - Abnormal; Notable for the following:    WBC 13.8 (*)    Platelets 137 (*)    All other components within normal limits  PROTIME-INR - Abnormal; Notable for the following:    Prothrombin Time 35.8 (*)    INR 3.69 (*)    All other components within normal limits  GLUCOSE, CAPILLARY - Abnormal; Notable for the following:    Glucose-Capillary 145 (*)    All other components within normal limits  LIPASE, BLOOD  TROPONIN I  TROPONIN I  TROPONIN I  HEPATITIS PANEL, ACUTE  I-STAT TROPOININ, ED  I-STAT TROPOININ, ED  POC OCCULT BLOOD, ED  I-STAT TROPOININ, ED  I-STAT TROPOININ, ED    Imaging Review Dg Chest 2 View  06/04/2015  CLINICAL DATA:  Intermittent chest pain and shortness of breath for several days, former smoker, type II diabetes mellitus, CHF, hypertension, history osteochondrosarcoma EXAM: CHEST  2 VIEW COMPARISON:  10/05/2013 FINDINGS: Enlargement of cardiac silhouette. Elongation of thoracic aorta. Mediastinal contours and pulmonary vascularity normal. Peribronchial thickening with an chronic accentuation of medial RIGHT lung base markings. No definite acute infiltrate, pleural effusion or pneumothorax. No acute osseous findings. IMPRESSION: Enlargement of cardiac silhouette. Chronic bronchitic changes and chronic accentuation of medial RIGHT lung base markings without acute infiltrate. Electronically Signed   By: Lavonia Dana M.D.   On: 06/04/2015 15:42   US Abdomen Complete  06/04/2015  CLINICAL DATA:  Acute right upper quadrant abdominal pain. EXAM: ULTRASOUND  ABDOMEN COMPLETE COMPARISON:  Ultrasound of November 13, 2013. FINDINGS: Gallbladder: Multiple gallstones are noted, with the largest measuring 2.7 cm. 8 mm gallstone is lodged in the neck of the gallbladder. No gallbladder wall thickening or pericholecystic fluid is noted. No sonographic Murphy's sign is noted. Common bile duct: Diameter: 4.9 mm which is within normal limits. Liver: Increased echogenicity is noted suggesting fatty infiltration. No focal abnormality is noted. IVC: No abnormality visualized. Pancreas: Visualized portion unremarkable. Spleen: Size and appearance within normal limits. Right Kidney: Length: 12.7 cm. Echogenicity within normal limits. No mass or hydronephrosis visualized. Left Kidney: Length: 12.6 cm. Echogenicity within normal limits. No hydronephrosis visualized. 3 cm complex cyst is seen in upper pole which is grossly stable compared to prior exam. Abdominal aorta: No aneurysm visualized. Other findings: None. IMPRESSION: Stable 3 cm complex cyst seen in upper pole of left kidney. Cholelithiasis is noted without definite evidence cholecystitis. Increased echogenicity of hepatic parenchyma is noted suggesting fatty infiltration or other diffuse hepatocellular disease. Electronically Signed   By: Marijo Conception, M.D.   On: 06/04/2015 18:23   I have personally reviewed and evaluated these images and lab results as part of my medical decision-making.   EKG Interpretation   Date/Time:  Saturday June 04 2015 14:27:49 EDT Ventricular Rate:  86 PR Interval:  232 QRS Duration: 94 QT Interval:  376 QTC Calculation: 449 R Axis:   49 Text Interpretation:  Sinus rhythm with 1st degree A-V block Left  ventricular hypertrophy with repolarization abnormality Abnormal ECG No  significant change since last tracing Confirmed by KNOTT MD, DANIEL  ([id number]) on 06/04/2015 2:54:28 PM      MDM   Final diagnoses:  RUQ pain  Calculus of gallbladder without cholecystitis without  obstruction  Patient presented for chest pain and abdominal pain. Recent history of stents. Patient has palpable right upper quadrant pain. Initial troponin negative nonspecific ST changes in lateral leads. Right upper quadrant ultrasound shows cholelithiasis. Patient required multiple doses of pain medication for symptom control. Chest pain resolved with nitroglycerin, but abdominal pain was persistent. Patient was discussed with surgery and cardiology as he is on antiplatelet and anticoagulant therapy. Patient to be admitted to medicine for symptomatic management with surgical evaluation and cardiology recommendations pending, as patient may ultimately need gallbladder surgery but would be a medically complicated surgical candidate in the setting of recent cardiac stents, on antiplatelet and anticoagulant therapy.  Pain improved with medications in emergency department patient transferred to inpatient unit in stable condition.  Patient care was discussed with my attending, Dr. Audie Pinto.   Hoyle Sauer, MD 06/05/15 1157  Leonard Schwartz, MD 06/10/15 [phone]

## 2015-06-05 ENCOUNTER — Encounter (HOSPITAL_COMMUNITY): Payer: Self-pay

## 2015-06-05 ENCOUNTER — Inpatient Hospital Stay (HOSPITAL_COMMUNITY): Payer: Medicare Other

## 2015-06-05 DIAGNOSIS — R1011 Right upper quadrant pain: Secondary | ICD-10-CM | POA: Diagnosis present

## 2015-06-05 DIAGNOSIS — Z9861 Coronary angioplasty status: Secondary | ICD-10-CM

## 2015-06-05 DIAGNOSIS — K8020 Calculus of gallbladder without cholecystitis without obstruction: Secondary | ICD-10-CM

## 2015-06-05 DIAGNOSIS — I2510 Atherosclerotic heart disease of native coronary artery without angina pectoris: Secondary | ICD-10-CM

## 2015-06-05 DIAGNOSIS — I10 Essential (primary) hypertension: Secondary | ICD-10-CM

## 2015-06-05 LAB — TROPONIN I
Troponin I: <0.03 ng/mL (ref <0.031)
Troponin I: <0.03 ng/mL (ref <0.031)
Troponin I: 0.03 ng/mL (ref <0.031)

## 2015-06-05 LAB — GLUCOSE, CAPILLARY
GLUCOSE-CAPILLARY: 145 mg/dL — AB (ref 65–99)
GLUCOSE-CAPILLARY: 156 mg/dL — AB (ref 65–99)
GLUCOSE-CAPILLARY: 152 mg/dL — AB (ref 65–99)
GLUCOSE-CAPILLARY: 166 mg/dL — AB (ref 65–99)

## 2015-06-05 LAB — PROTIME-INR
INR: 3.69 — AB (ref 0.00–1.49)
Prothrombin Time: 35.8 seconds — ABNORMAL HIGH (ref 11.6–15.2)

## 2015-06-05 LAB — BASIC METABOLIC PANEL
ANION GAP: 8 (ref 5–15)
BUN: 8 mg/dL (ref 6–20)
CO2: 25 mmol/L (ref 22–32)
Calcium: 8.7 mg/dL — ABNORMAL LOW (ref 8.9–10.3)
Chloride: 100 mmol/L — ABNORMAL LOW (ref 101–111)
Creatinine, Ser: 1 mg/dL (ref 0.61–1.24)
GLUCOSE: 168 mg/dL — AB (ref 65–99)
POTASSIUM: 3.6 mmol/L (ref 3.5–5.1)
Sodium: 133 mmol/L — ABNORMAL LOW (ref 135–145)

## 2015-06-05 LAB — CBC
HEMATOCRIT: 41.8 % (ref 39.0–52.0)
HEMOGLOBIN: 14.1 g/dL (ref 13.0–17.0)
MCH: 27.8 pg (ref 26.0–34.0)
MCHC: 33.7 g/dL (ref 30.0–36.0)
MCV: 82.4 fL (ref 78.0–100.0)
Platelets: 137 10*3/uL — ABNORMAL LOW (ref 150–400)
RBC: 5.07 MIL/uL (ref 4.22–5.81)
RDW: 13.6 % (ref 11.5–15.5)
WBC: 13.8 10*3/uL — ABNORMAL HIGH (ref 4.0–10.5)

## 2015-06-05 LAB — MAGNESIUM: Magnesium: 1.6 mg/dL — ABNORMAL LOW (ref 1.7–2.4)

## 2015-06-05 MED ORDER — MAGNESIUM SULFATE 50 % IJ SOLN: 2.0000 g | Freq: Once | INTRAMUSCULAR | Status: DC

## 2015-06-05 MED ORDER — MAGNESIUM SULFATE 2 GM/50ML IV SOLN
2.0000 g | Freq: Once | INTRAVENOUS | Status: AC
  Administered 2015-06-05: 2 g via INTRAVENOUS
  Filled 2015-06-05: qty 50

## 2015-06-05 MED ORDER — MORPHINE SULFATE (PF) 2 MG/ML IV SOLN: 2.0000 mg | Freq: Once | INTRAVENOUS | Status: DC

## 2015-06-05 MED ORDER — MORPHINE SULFATE (PF) 4 MG/ML IV SOLN
3.9000 mg | Freq: Once | INTRAVENOUS | Status: AC
  Administered 2015-06-05: 3.9 mg via INTRAVENOUS

## 2015-06-05 MED ORDER — MORPHINE SULFATE (PF) 4 MG/ML IV SOLN
INTRAVENOUS | Status: AC
  Administered 2015-06-05: 3.9 mg via INTRAVENOUS
  Filled 2015-06-05: qty 1

## 2015-06-05 MED ORDER — TECHNETIUM TC 99M MEBROFENIN IV KIT
5.2000 | PACK | Freq: Once | INTRAVENOUS | Status: DC | PRN
  Administered 2015-06-05: 5 via INTRAVENOUS
  Filled 2015-06-05: qty 6

## 2015-06-05 NOTE — Progress Notes (Signed)
Family Medicine Teaching Service Daily Progress Note Intern Pager: [phone]  Patient name: [name] [name] Medical record number: [id number] Date of birth: 09-19-1955 Age: 59 y.o. Gender: male  Primary Care Provider: Angelica Chessman, MD Consultants: surgery Code Status: Full   Assessment and Plan: [name] [name] is a 59 y.o. male presenting with Symptomatic Cholelithiasis. PMH is significant for CAD s/p DES x 2 in the RCA (9/16), CHF rEF mixed etiology though most recent Echo EF 55-60%, DMII, HTN, OSA (Needs CPAP device at home), Paroxysmal A Fib (hx of A flutter s/p ablation '07/'08), Chronic Anticoagulation, HLD, Gout, GERD, ? Chronic Hep B.   Acute Cholecysitis - Pt. Complaining of RUQ pain x 2 days. IS worse with meals. No fever, no chills. No diarrhea. WBC - 13.8, Bili - 0.1, LFT's otherwise normal, Lipase 30. PT/INR elevated 2/2 coumadin. RUQ U/S with 75mm stone in the neck of the gallbladder. TTP in the RUQ on exam. Pain requiring IV pain medicine for control. HIDA scan revealed cystic duct obstruction and acute cholecystitis - Surgery consulted, appreciate their assistance  - NPO for now until surgery recs - Empiric Ceftriaxone (10/22-  )  - Hold Coumadin - Pain control with  PRN Dilaudid - Phenergan for nausea   CAD s/p DES x 2 in the RCA - Pt. With some "twinges in his left chest radiating down his left arm" in the midst of his RUQ pain. No active chest pain or SOB at this time. He does get SOB with exertion at baseline. No LE edema. No crackles on lung exam. Troponin negative in the ED. Has been taking ASA, Plavix, Coumadin. INR Supra therapeutic. EKG no changes, Troponin neg x3.  - Continue Plavix.  - hold aspirin and coumadin for now.  - Will let his INR drift down to therapeutic range  - Cardiology has been contacted; Dr. Martinique said if patient were to have surgery, could continue Plavix, just stop Coumadin.   CHFrEF : Mixed etiology ETOH? / Ischemic. Last EF noted  55-60% with heart cath. Managed medically as an outpatient. No LE swelling, or lung exam findings at this time.  - Hold Lasix for now - Will monitor fluid status closely.  - I/O's - Daily weights.   Paroxysmal Afib (Hx of A-Flutter s/p Ablation 4174,0814); Hx of RVR at one time as well. Normal sinus rhythm here. On coumadin.  - Continue medical management.  - Letting INR drift down.  - Am PT/INR.   DMII - A1C 8 at last check.  - Hold Lantus 65 u q am while NPO - Moderate SSI.  - CBG monitoring.   HTN - BP elevated here. - Continue Coreg, Hydralazine, Imdur, and Losartan, hold the rest of his home anti hypertensives for now - Continue to monitor.   OSA - symptoms include waking up at night gasping for breath, but no particular orthopnea. He says that he is waiting for his machine to be delivered to his house.  - CPAP here. Autotitrate.   GERD - continue protonix.   HLD - continue lipitor  Hx of ETOH Abuse - says that he has been cutting back on his drinking - says he drinks 1-2 drinks every other day now. Used to drink much more and says he had an ETOH abuse problem. Also MJ, but otherwise no other substances.  - CIWA protocol   Chronic Hep B - listed in chart. No labs to support this. Pt. Without knowledge of this diagnosis.  - LFT's appropriate.  -  Check Hep panel here.   FEN/GI: Regular Diet / NPO after MN, Protonix Prophylaxis: Anticoagulated.    Disposition: Pending surgery recs  Subjective:  Patient went to HIDA scan this morning. Admits to continued abdominal pain, mostly RUQ. Denies nausea, vomiting, diarrhea. He is still NPO but requesting to be put on a diet. Denies chest pain at this time.   Objective: Temp:  [98 F (36.7 C)-98.4 F (36.9 C)] 98.2 F (36.8 C) (10/23 0441) Pulse Rate:  [64-96] 88 (10/23 0441) Resp:  [10-28] 18 (10/23 0441) BP: (130-192)/(51-144) 151/67 mmHg (10/23 0441) SpO2:  [90 %-100 %] 97 % (10/23 0441) Weight:  [212 lb  (96.163 kg)] 212 lb (96.163 kg) (10/22 2128) Physical Exam: General: NAD, AAOx3, resting comfortably in bed Cardiovascular: RRR, no MGR, normal S1/S2 Respiratory: CTA bilaterally, no increased work of breathing Abdomen:soft, distended, tenderness to palpation of RUQ and LLQ, positive Murphy sign. Hypoactive bowel sounds Extremities: no edema  Laboratory:  Recent Labs Lab 06/04/15 1438 06/05/15 0316  WBC 6.7 13.8*  HGB 14.6 14.1  HCT 43.4 41.8  PLT 134* 137*    Recent Labs Lab 06/04/15 1438 06/04/15 1612 06/05/15 0316  NA 134*  --  133*  K 3.5  --  3.6  CL 100*  --  100*  CO2 26  --  25  BUN 8  --  8  CREATININE 1.04  --  1.00  CALCIUM 9.3  --  8.7*  PROT  --  7.3  --   BILITOT  --  0.5  --   ALKPHOS  --  48  --   ALT  --  14*  --   AST  --  19  --   GLUCOSE 174*  --  168*    Troponin (Point of Care Test)  Recent Labs  06/04/15 1841  TROPIPOC 0.01     Imaging/Diagnostic Tests: Dg Chest 2 View  06/04/2015  CLINICAL DATA:  Intermittent chest pain and shortness of breath for several days, former smoker, type II diabetes mellitus, CHF, hypertension, history osteochondrosarcoma EXAM: CHEST  2 VIEW COMPARISON:  10/05/2013 FINDINGS: Enlargement of cardiac silhouette. Elongation of thoracic aorta. Mediastinal contours and pulmonary vascularity normal. Peribronchial thickening with an chronic accentuation of medial RIGHT lung base markings. No definite acute infiltrate, pleural effusion or pneumothorax. No acute osseous findings. IMPRESSION: Enlargement of cardiac silhouette. Chronic bronchitic changes and chronic accentuation of medial RIGHT lung base markings without acute infiltrate. Electronically Signed   By: Lavonia Dana M.D.   On: 06/04/2015 15:42   Nm Hepatobiliary Liver Func  06/05/2015  CLINICAL DATA:  Symptomatic cholelithiasis, RIGHT upper quadrant pain, nausea, vomiting EXAM: NUCLEAR MEDICINE HEPATOBILIARY IMAGING TECHNIQUE: Sequential images of the abdomen  were obtained out to 60 minutes following intravenous administration of radiopharmaceutical. Examination was initially planned for assessment of gallbladder function following CCK administration. However, the gallbladder was not visualized during the first 90 minutes of imaging. Patient was then reinjected with tracer and 3.9 millicurie items of morphine sulfate IV and an additional 30 minutes of imaging was performed to assess for patency of the cystic duct. This precluded subsequent CCK administration. RADIOPHARMACEUTICALS:  5 millicuries + 2 mCi [id number] Choletec IV as above COMPARISON:  Ultrasound abdomen 06/04/2015, CT abdomen and pelvis 01/11/2007 FINDINGS: Normal tracer extraction indicating normal hepatocellular function. Prompt excretion of tracer into the biliary tree. Small bowel is visualized by 6 minutes. At 1 hour, gallbladder was not definitely visualized. Lateral view demonstrated a small amount of subhepatic  tracer anteriorly, by subsequent image likely biliary reflux into stomach, not gallbladder. Following morphine and reinjection of tracer, CBD tracer is again identified. Gallbladder did not visualize following morphine augmentation. Findings are consistent with cystic duct obstruction and acute cholecystitis. IMPRESSION: Normal hepatocellular function. Patent CBD. Nonvisualization of the gallbladder despite morphine augmentation consistent with cystic duct obstruction and acute cholecystitis. Electronically Signed   By: Lavonia Dana M.D.   On: 06/05/2015 12:18   US Abdomen Complete  06/04/2015  CLINICAL DATA:  Acute right upper quadrant abdominal pain. EXAM: ULTRASOUND ABDOMEN COMPLETE COMPARISON:  Ultrasound of November 13, 2013. FINDINGS: Gallbladder: Multiple gallstones are noted, with the largest measuring 2.7 cm. 8 mm gallstone is lodged in the neck of the gallbladder. No gallbladder wall thickening or pericholecystic fluid is noted. No sonographic Murphy's sign is noted. Common bile duct:  Diameter: 4.9 mm which is within normal limits. Liver: Increased echogenicity is noted suggesting fatty infiltration. No focal abnormality is noted. IVC: No abnormality visualized. Pancreas: Visualized portion unremarkable. Spleen: Size and appearance within normal limits. Right Kidney: Length: 12.7 cm. Echogenicity within normal limits. No mass or hydronephrosis visualized. Left Kidney: Length: 12.6 cm. Echogenicity within normal limits. No hydronephrosis visualized. 3 cm complex cyst is seen in upper pole which is grossly stable compared to prior exam. Abdominal aorta: No aneurysm visualized. Other findings: None. IMPRESSION: Stable 3 cm complex cyst seen in upper pole of left kidney. Cholelithiasis is noted without definite evidence cholecystitis. Increased echogenicity of hepatic parenchyma is noted suggesting fatty infiltration or other diffuse hepatocellular disease. Electronically Signed   By: Marijo Conception, M.D.   On: 06/04/2015 18:23     Carlyle Dolly, MD 06/05/2015, 7:27 AM PGY-1, [place] Intern pager: [phone], text pages welcome

## 2015-06-05 NOTE — Progress Notes (Signed)
cpap set up for patient, he states he will call when ready to put on for the evening.

## 2015-06-06 DIAGNOSIS — N179 Acute kidney failure, unspecified: Secondary | ICD-10-CM

## 2015-06-06 DIAGNOSIS — K810 Acute cholecystitis: Secondary | ICD-10-CM

## 2015-06-06 DIAGNOSIS — Z7901 Long term (current) use of anticoagulants: Secondary | ICD-10-CM

## 2015-06-06 DIAGNOSIS — I480 Paroxysmal atrial fibrillation: Secondary | ICD-10-CM

## 2015-06-06 DIAGNOSIS — Z5181 Encounter for therapeutic drug level monitoring: Secondary | ICD-10-CM

## 2015-06-06 LAB — CBC
HEMATOCRIT: 41.2 % (ref 39.0–52.0)
HEMOGLOBIN: 13.6 g/dL (ref 13.0–17.0)
MCH: 27.9 pg (ref 26.0–34.0)
MCHC: 33 g/dL (ref 30.0–36.0)
MCV: 84.4 fL (ref 78.0–100.0)
Platelets: 132 10*3/uL — ABNORMAL LOW (ref 150–400)
RBC: 4.88 MIL/uL (ref 4.22–5.81)
RDW: 13.8 % (ref 11.5–15.5)
WBC: 12.3 10*3/uL — ABNORMAL HIGH (ref 4.0–10.5)

## 2015-06-06 LAB — GLUCOSE, CAPILLARY
GLUCOSE-CAPILLARY: 169 mg/dL — AB (ref 65–99)
Glucose-Capillary: 146 mg/dL — ABNORMAL HIGH (ref 65–99)
Glucose-Capillary: 165 mg/dL — ABNORMAL HIGH (ref 65–99)
Glucose-Capillary: 123 mg/dL — ABNORMAL HIGH (ref 65–99)

## 2015-06-06 LAB — HEPATITIS PANEL, ACUTE
HEP A IGM: NEGATIVE
HEP B C IGM: NEGATIVE
HEP B S AG: NEGATIVE

## 2015-06-06 LAB — PROTIME-INR
INR: 3.45 — ABNORMAL HIGH (ref 0.00–1.49)
INR: 3.4 — AB (ref 0.00–1.49)
Prothrombin Time: 34 seconds — ABNORMAL HIGH (ref 11.6–15.2)
Prothrombin Time: 33.6 seconds — ABNORMAL HIGH (ref 11.6–15.2)

## 2015-06-06 LAB — COMPREHENSIVE METABOLIC PANEL
ALBUMIN: 3.1 g/dL — AB (ref 3.5–5.0)
ALK PHOS: 47 U/L (ref 38–126)
ALT: 13 U/L — AB (ref 17–63)
ANION GAP: 8 (ref 5–15)
AST: 16 U/L (ref 15–41)
BILIRUBIN TOTAL: 1.1 mg/dL (ref 0.3–1.2)
BUN: 13 mg/dL (ref 6–20)
CALCIUM: 8.9 mg/dL (ref 8.9–10.3)
CO2: 29 mmol/L (ref 22–32)
CREATININE: 1.37 mg/dL — AB (ref 0.61–1.24)
Chloride: 100 mmol/L — ABNORMAL LOW (ref 101–111)
GFR calc Af Amer: >60 mL/min (ref >60)
GFR calc non Af Amer: 54 mL/min — ABNORMAL LOW (ref >60)
GLUCOSE: 140 mg/dL — AB (ref 65–99)
Potassium: 3.8 mmol/L (ref 3.5–5.1)
Sodium: 137 mmol/L (ref 135–145)
TOTAL PROTEIN: 6.9 g/dL (ref 6.5–8.1)

## 2015-06-06 MED ORDER — DEXTROSE 5 % IV SOLN
1.0000 mg | Freq: Once | INTRAVENOUS | Status: AC
  Administered 2015-06-06: 1 mg via INTRAVENOUS
  Filled 2015-06-06: qty 0.1

## 2015-06-06 MED ORDER — HYDROMORPHONE HCL 1 MG/ML IJ SOLN
1.0000 mg | INTRAMUSCULAR | Status: DC | PRN
  Administered 2015-06-06 – 2015-06-07 (×6): 1 mg via INTRAVENOUS
  Filled 2015-06-06 (×6): qty 1

## 2015-06-06 MED ORDER — PIPERACILLIN-TAZOBACTAM 3.375 G IVPB
3.3750 g | Freq: Three times a day (TID) | INTRAVENOUS | Status: DC
  Administered 2015-06-06 – 2015-06-09 (×10): 3.375 g via INTRAVENOUS
  Filled 2015-06-06 (×13): qty 50

## 2015-06-06 NOTE — Progress Notes (Signed)
Family Medicine Teaching Service Daily Progress Note Intern Pager: [phone]  Patient name: [name] [name] Medical record number: [id number] Date of birth: 12-Jun-1956 Age: 59 y.o. Gender: male  Primary Care Provider: Angelica Chessman, MD Consultants: surgery Code Status: Full   Assessment and Plan: [name] [name] is a 59 y.o. male presenting with Symptomatic Cholelithiasis. PMH is significant for CAD s/p DES x 2 in the RCA (9/16), CHF rEF mixed etiology though most recent Echo EF 55-60%, DMII, HTN, OSA (Needs CPAP device at home), Paroxysmal A Fib (hx of A flutter s/p ablation '07/'08), Chronic Anticoagulation, HLD, Gout, GERD, ? Chronic Hep B.   Acute Cholecysitis - Pt complaining of RUQ pain x 3 days. IS worse with meals. No fever, no chills. No diarrhea. WBC - 13.8, Bili - 0.1, LFT's otherwise normal, Lipase 30. PT/INR elevated 2/2 coumadin. RUQ U/S with 87mm stone in the neck of the gallbladder. TTP in the RUQ on exam. Pain requiring IV pain medicine for control. HIDA scan revealed cystic duct obstruction and acute cholecystitis - Surgery consulted, appreciate their assistance. They would like [place] to call when her INR is less than 2.   - Heart healthy/carb modified diet while patient's INR trends down. - Empiric Ceftriaxone (10/22-  )  - Hold Coumadin - Pain control with PRN Dilaudid - Phenergan for nausea   CAD s/p DES x 2 in the RCA - Pt. With some "twinges in his left chest radiating down his left arm" in the midst of his RUQ pain. No active chest pain or SOB at this time. He does get SOB with exertion at baseline. No LE edema. No crackles on lung exam. Troponin negative in the ED. Has been taking ASA, Plavix, Coumadin at home. INR Supra therapeutic. EKG no changes, Troponin neg x3.  - Continue Plavix.  - hold aspirin and coumadin for now.  - Will let his INR drift down to therapeutic range  - Cardiology has been contacted; Dr. [place] said if patient were to have surgery, could  continue Plavix, just stop Coumadin.   CHFrEF : Mixed etiology ETOH? / Ischemic. Last EF noted 55-60% with heart cath. Managed medically as an outpatient. No LE swelling, or lung exam findings at this time.  - Hold Lasix for now - Will monitor fluid status closely.  - I/O's - Daily weights.   Paroxysmal Afib (Hx of A-Flutter s/p Ablation 8022,3361); Hx of RVR at one time as well. Normal sinus rhythm here. On coumadin.  - Continue medical management.  - Letting INR drift down.  - Am PT/INR.  - Give 1mg  VIt K to allow INR to trend down faster  DMII - A1C 8 at last check.  - Hold Lantus 65 u q am while NPO - Moderate SSI.  - CBG monitoring.   HTN - BP elevated here. - Continue Coreg, Hydralazine, Imdur, and Losartan, hold the rest of his home anti hypertensives for now - Continue to monitor.   OSA - symptoms include waking up at night gasping for breath, but no particular orthopnea. He says that he is waiting for his machine to be delivered to his house.  - CPAP here. Autotitrate.   GERD- continue protonix.   HLD- continue lipitor  Hx of ETOH Abuse - says that he has been cutting back on his drinking - says he drinks 1-2 drinks every other day now. Used to drink much more and says he had an ETOH abuse problem. Also MJ, but otherwise no other substances.  -  CIWA protocol   Chronic Hep B - listed in chart. No labs to support this. Pt. Without knowledge of this diagnosis.  - LFT's appropriate.  - f/u Hep panel   FEN/GI: Heart healthy carb modified diet, Protonix Prophylaxis: Just Plavix, allowing INR to trend down for surgery    Disposition: Pending surgery recs  Subjective:  Patient has no acute events overnight. Admits to continued abdominal pain, mostly RUQ. Denies nausea, vomiting, diarrhea. Denies chest pain at this time. He is eating 100% of his meals and is able to ambulate.   Objective: Temp:  [98.8 F (37.1 C)-100.3 F (37.9 C)] 99.2 F (37.3 C)  (10/24 0615) Pulse Rate:  [75-94] 90 (10/24 0615) Resp:  [17-18] 18 (10/24 0615) BP: (102-133)/(56-83) 133/78 mmHg (10/24 0615) SpO2:  [93 %-100 %] 100 % (10/24 0615) Physical Exam: General: NAD, AAOx3, resting comfortably in bed Cardiovascular: RRR, no MGR, normal S1/S2 Respiratory: CTA bilaterally, no increased work of breathing Abdomen:soft, distended, tenderness to palpation of RUQ and LLQ, positive Murphy sign. Hypoactive bowel sounds Extremities: no edema  Laboratory:  Recent Labs Lab 06/04/15 1438 06/05/15 0316  WBC 6.7 13.8*  HGB 14.6 14.1  HCT 43.4 41.8  PLT 134* 137*    Recent Labs Lab 06/04/15 1438 06/04/15 1612 06/05/15 0316  NA 134*  --  133*  K 3.5  --  3.6  CL 100*  --  100*  CO2 26  --  25  BUN 8  --  8  CREATININE 1.04  --  1.00  CALCIUM 9.3  --  8.7*  PROT  --  7.3  --   BILITOT  --  0.5  --   ALKPHOS  --  48  --   ALT  --  14*  --   AST  --  19  --   GLUCOSE 174*  --  168*    Troponin (Point of Care Test)  Recent Labs  06/04/15 1841  TROPIPOC 0.01     Imaging/Diagnostic Tests: Nm Hepatobiliary Liver Func  06/05/2015  CLINICAL DATA:  Symptomatic cholelithiasis, RIGHT upper quadrant pain, nausea, vomiting EXAM: NUCLEAR MEDICINE HEPATOBILIARY IMAGING TECHNIQUE: Sequential images of the abdomen were obtained out to 60 minutes following intravenous administration of radiopharmaceutical. Examination was initially planned for assessment of gallbladder function following CCK administration. However, the gallbladder was not visualized during the first 90 minutes of imaging. Patient was then reinjected with tracer and 3.9 millicurie items of morphine sulfate IV and an additional 30 minutes of imaging was performed to assess for patency of the cystic duct. This precluded subsequent CCK administration. RADIOPHARMACEUTICALS:  5 millicuries + 2 mCi RC-78L Choletec IV as above COMPARISON:  Ultrasound abdomen 06/04/2015, CT abdomen and pelvis 01/11/2007  FINDINGS: Normal tracer extraction indicating normal hepatocellular function. Prompt excretion of tracer into the biliary tree. Small bowel is visualized by 6 minutes. At 1 hour, gallbladder was not definitely visualized. Lateral view demonstrated a small amount of subhepatic tracer anteriorly, by subsequent image likely biliary reflux into stomach, not gallbladder. Following morphine and reinjection of tracer, CBD tracer is again identified. Gallbladder did not visualize following morphine augmentation. Findings are consistent with cystic duct obstruction and acute cholecystitis. IMPRESSION: Normal hepatocellular function. Patent CBD. Nonvisualization of the gallbladder despite morphine augmentation consistent with cystic duct obstruction and acute cholecystitis. Electronically Signed   By: Lavonia Dana M.D.   On: 06/05/2015 12:18     Carlyle Dolly, MD 06/06/2015, 7:29 AM PGY-1, [place]  Intern pager: [phone], text pages welcome

## 2015-06-06 NOTE — Progress Notes (Signed)
IR PA aware of request for percutaneous cholecystostomy tube. INR 10/23 was 3.69 and patient is on Plavix secondary to recent DES placed 05/03/2015. Discussed with Dr. Earleen Newport and will need INR closer to 1.5, will await cardiology input on Plavix and coumadin prior to proceeding.   Tsosie Billing PA-C Interventional Radiology  06/06/15  8:42 AM

## 2015-06-06 NOTE — Care Management Note (Signed)
Case Management Note  Patient Details  Name: [name] [name] MRN: [id number] Date of Birth: 09/04/1955  Subjective/Objective:                    Action/Plan:  UR updated  Expected Discharge Date:                  Expected Discharge Plan:  Home/Self Care  In-House Referral:     Discharge planning Services     Post Acute Care Choice:    Choice offered to:     DME Arranged:    DME Agency:     HH Arranged:    [place] Agency:     Status of Service:  In process, will continue to follow  Medicare Important Message Given:    Date Medicare IM Given:    Medicare IM give by:    Date Additional Medicare IM Given:    Additional Medicare Important Message give by:     If discussed at [place] of Stay Meetings, dates discussed:    Additional Comments:  Marilu Favre, RN 06/06/2015, 3:49 PM

## 2015-06-06 NOTE — Procedures (Signed)
Pt placed on CPAP.  Setting changed for pt's comfort.

## 2015-06-06 NOTE — Procedures (Signed)
Pt placed on CPAP with auto mode settings max-15 and min-5

## 2015-06-06 NOTE — Progress Notes (Signed)
Patient ID: [name] [name], male   DOB: 1956/04/20, 59 y.o.   MRN: [id number]     [facility]      [address] [place], [place] [address]    Phone: [phone] FAX: [phone]     Subjective: Pain initially improved, now worsened. No labs today.   T max 100.3.  No n/v.  Eating heart healthy diet.   Objective:  Vital signs:  Filed Vitals:   06/05/15 1710 06/05/15 2129 06/06/15 0215 06/06/15 0615  BP: 113/56 102/64 119/70 133/78  Pulse: 94 88 81 90  Temp: 100.3 F (37.9 C) 98.8 F (37.1 C) 99.5 F (37.5 C) 99.2 F (37.3 C)  TempSrc: Oral Oral    Resp: _0 Height:      Weight:      SpO2: 93% 96% 96% 100%       Intake/Output   Yesterday:  10/23 0701 - 10/24 0700 In: 360 [P.O.:360] Out: -  This shift: I/O last 3 completed shifts: In: 360 [P.O.:360] Out: 525 [Urine:525]   Physical Exam: General: Pt awake/alert/oriented x4 in no  acute distress Abdomen: Soft.  Nondistended.  TTP RUQ.   No evidence of peritonitis.  No incarcerated hernias.    Problem List:   Active Problems:   Cholelithiases   Cholelithiasis   Calculus of gallbladder without cholecystitis without obstruction   RUQ pain   Coronary artery disease involving native coronary artery of native heart without angina pectoris    Results:   Labs: Results for orders placed or performed during the hospital encounter of 06/04/15 (from the past 48 hour(s))  Basic metabolic panel     Status: Abnormal   Collection Time: 06/04/15  2:38 PM  Result Value Ref Range   Sodium 134 (L) 135 - 145 mmol/L   Potassium 3.5 3.5 - 5.1 mmol/L   Chloride 100 (L) 101 - 111 mmol/L   CO2 26 22 - 32 mmol/L   Glucose, Bld 174 (H) 65 - 99 mg/dL   BUN 8 6 - 20 mg/dL   Creatinine, Ser 1.04 0.61 - 1.24 mg/dL   Calcium 9.3 8.9 - 10.3 mg/dL   GFR calc non Af Amer >60 >60 mL/min   GFR calc Af Amer >60 >60 mL/min    Comment: (NOTE) The eGFR has been calculated using  the CKD EPI equation. This calculation has not been validated in all clinical situations. eGFR's persistently <60 mL/min signify possible Chronic Kidney Disease.    Anion gap 8 5 - 15  CBC     Status: Abnormal   Collection Time: 06/04/15  2:38 PM  Result Value Ref Range   WBC 6.7 4.0 - 10.5 K/uL   RBC 5.21 4.22 - 5.81 MIL/uL   Hemoglobin 14.6 13.0 - 17.0 g/dL   HCT 43.4 39.0 - 52.0 %   MCV 83.3 78.0 - 100.0 fL   MCH 28.0 26.0 - 34.0 pg   MCHC 33.6 30.0 - 36.0 g/dL   RDW 13.5 11.5 - 15.5 %   Platelets 134 (L) 150 - 400 K/uL  Lipase, blood     Status: None   Collection Time: 06/04/15  2:38 PM  Result Value Ref Range   Lipase 30 11 - 51 U/L    Comment: Please note change in reference range.  I-stat troponin, ED     Status: None   Collection Time: 06/04/15  2:45 PM  Result Value Ref Range   Troponin i, poc  0.00 0.00 - 0.08 ng/mL   Comment 3            Comment: Due to the release kinetics of cTnI, a negative result within the first hours of the onset of symptoms does not rule out myocardial infarction with certainty. If myocardial infarction is still suspected, repeat the test at appropriate intervals.   Protime-INR     Status: Abnormal   Collection Time: 06/04/15  3:59 PM  Result Value Ref Range   Prothrombin Time 41.4 (H) 11.6 - 15.2 seconds   INR 4.49 (H) 0.00 - 1.49  Hepatic function panel     Status: Abnormal   Collection Time: 06/04/15  4:12 PM  Result Value Ref Range   Total Protein 7.3 6.5 - 8.1 g/dL   Albumin 3.8 3.5 - 5.0 g/dL   AST 19 15 - 41 U/L   ALT 14 (L) 17 - 63 U/L   Alkaline Phosphatase 48 38 - 126 U/L   Total Bilirubin 0.5 0.3 - 1.2 mg/dL   Bilirubin, Direct <0.1 (L) 0.1 - 0.5 mg/dL   Indirect Bilirubin NOT CALCULATED 0.3 - 0.9 mg/dL  POC occult blood, ED Provider will collect     Status: None   Collection Time: 06/04/15  4:20 PM  Result Value Ref Range   Fecal Occult Bld NEGATIVE NEGATIVE  I-stat troponin, ED     Status: None   Collection Time:  06/04/15  6:41 PM  Result Value Ref Range   Troponin i, poc 0.01 0.00 - 0.08 ng/mL   Comment 3            Comment: Due to the release kinetics of cTnI, a negative result within the first hours of the onset of symptoms does not rule out myocardial infarction with certainty. If myocardial infarction is still suspected, repeat the test at appropriate intervals.   Troponin I (q 6hr x 3)     Status: None   Collection Time: 06/04/15 10:01 PM  Result Value Ref Range   Troponin I <0.03 <0.031 ng/mL    Comment:        NO INDICATION OF MYOCARDIAL INJURY.   Magnesium     Status: Abnormal   Collection Time: 06/04/15 10:01 PM  Result Value Ref Range   Magnesium 1.6 (L) 1.7 - 2.4 mg/dL  Troponin I (q 6hr x 3)     Status: None   Collection Time: 06/05/15  3:16 AM  Result Value Ref Range   Troponin I <0.03 <0.031 ng/mL    Comment:        NO INDICATION OF MYOCARDIAL INJURY.   Basic metabolic panel     Status: Abnormal   Collection Time: 06/05/15  3:16 AM  Result Value Ref Range   Sodium 133 (L) 135 - 145 mmol/L   Potassium 3.6 3.5 - 5.1 mmol/L   Chloride 100 (L) 101 - 111 mmol/L   CO2 25 22 - 32 mmol/L   Glucose, Bld 168 (H) 65 - 99 mg/dL   BUN 8 6 - 20 mg/dL   Creatinine, Ser 1.00 0.61 - 1.24 mg/dL   Calcium 8.7 (L) 8.9 - 10.3 mg/dL   GFR calc non Af Amer >60 >60 mL/min   GFR calc Af Amer >60 >60 mL/min    Comment: (NOTE) The eGFR has been calculated using the CKD EPI equation. This calculation has not been validated in all clinical situations. eGFR's persistently <60 mL/min signify possible Chronic Kidney Disease.    Anion gap 8 5 -  15  CBC     Status: Abnormal   Collection Time: 06/05/15  3:16 AM  Result Value Ref Range   WBC 13.8 (H) 4.0 - 10.5 K/uL   RBC 5.07 4.22 - 5.81 MIL/uL   Hemoglobin 14.1 13.0 - 17.0 g/dL   HCT 41.8 39.0 - 52.0 %   MCV 82.4 78.0 - 100.0 fL   MCH 27.8 26.0 - 34.0 pg   MCHC 33.7 30.0 - 36.0 g/dL   RDW 13.6 11.5 - 15.5 %   Platelets 137 (L) 150 -  400 K/uL  Protime-INR     Status: Abnormal   Collection Time: 06/05/15  3:16 AM  Result Value Ref Range   Prothrombin Time 35.8 (H) 11.6 - 15.2 seconds   INR 3.69 (H) 0.00 - 1.49  Glucose, capillary     Status: Abnormal   Collection Time: 06/05/15  8:24 AM  Result Value Ref Range   Glucose-Capillary 145 (H) 65 - 99 mg/dL  Glucose, capillary     Status: Abnormal   Collection Time: 06/05/15 12:38 PM  Result Value Ref Range   Glucose-Capillary 156 (H) 65 - 99 mg/dL   Comment 1 Notify RN   Troponin I (q 6hr x 3)     Status: None   Collection Time: 06/05/15  2:30 PM  Result Value Ref Range   Troponin I 0.03 <0.031 ng/mL    Comment:        NO INDICATION OF MYOCARDIAL INJURY.   Glucose, capillary     Status: Abnormal   Collection Time: 06/05/15  4:40 PM  Result Value Ref Range   Glucose-Capillary 152 (H) 65 - 99 mg/dL  Glucose, capillary     Status: Abnormal   Collection Time: 06/05/15  9:34 PM  Result Value Ref Range   Glucose-Capillary 166 (H) 65 - 99 mg/dL   Comment 1 Notify RN    Comment 2 Document in Chart   Glucose, capillary     Status: Abnormal   Collection Time: 06/06/15  8:02 AM  Result Value Ref Range   Glucose-Capillary 146 (H) 65 - 99 mg/dL    Imaging / Studies: Dg Chest 2 View  06/04/2015  CLINICAL DATA:  Intermittent chest pain and shortness of breath for several days, former smoker, type II diabetes mellitus, CHF, hypertension, history osteochondrosarcoma EXAM: CHEST  2 VIEW COMPARISON:  10/05/2013 FINDINGS: Enlargement of cardiac silhouette. Elongation of thoracic aorta. Mediastinal contours and pulmonary vascularity normal. Peribronchial thickening with an chronic accentuation of medial RIGHT lung base markings. No definite acute infiltrate, pleural effusion or pneumothorax. No acute osseous findings. IMPRESSION: Enlargement of cardiac silhouette. Chronic bronchitic changes and chronic accentuation of medial RIGHT lung base markings without acute infiltrate.  Electronically Signed   By: Lavonia Dana M.D.   On: 06/04/2015 15:42   Nm Hepatobiliary Liver Func  06/05/2015  CLINICAL DATA:  Symptomatic cholelithiasis, RIGHT upper quadrant pain, nausea, vomiting EXAM: NUCLEAR MEDICINE HEPATOBILIARY IMAGING TECHNIQUE: Sequential images of the abdomen were obtained out to 60 minutes following intravenous administration of radiopharmaceutical. Examination was initially planned for assessment of gallbladder function following CCK administration. However, the gallbladder was not visualized during the first 90 minutes of imaging. Patient was then reinjected with tracer and 3.9 millicurie items of morphine sulfate IV and an additional 30 minutes of imaging was performed to assess for patency of the cystic duct. This precluded subsequent CCK administration. RADIOPHARMACEUTICALS:  5 millicuries + 2 mCi ZO-10R Choletec IV as above COMPARISON:  Ultrasound abdomen 06/04/2015,  CT abdomen and pelvis 01/11/2007 FINDINGS: Normal tracer extraction indicating normal hepatocellular function. Prompt excretion of tracer into the biliary tree. Small bowel is visualized by 6 minutes. At 1 hour, gallbladder was not definitely visualized. Lateral view demonstrated a small amount of subhepatic tracer anteriorly, by subsequent image likely biliary reflux into stomach, not gallbladder. Following morphine and reinjection of tracer, CBD tracer is again identified. Gallbladder did not visualize following morphine augmentation. Findings are consistent with cystic duct obstruction and acute cholecystitis. IMPRESSION: Normal hepatocellular function. Patent CBD. Nonvisualization of the gallbladder despite morphine augmentation consistent with cystic duct obstruction and acute cholecystitis. Electronically Signed   By: Lavonia Dana M.D.   On: 06/05/2015 12:18   US Abdomen Complete  06/04/2015  CLINICAL DATA:  Acute right upper quadrant abdominal pain. EXAM: ULTRASOUND ABDOMEN COMPLETE COMPARISON:   Ultrasound of November 13, 2013. FINDINGS: Gallbladder: Multiple gallstones are noted, with the largest measuring 2.7 cm. 8 mm gallstone is lodged in the neck of the gallbladder. No gallbladder wall thickening or pericholecystic fluid is noted. No sonographic Murphy's sign is noted. Common bile duct: Diameter: 4.9 mm which is within normal limits. Liver: Increased echogenicity is noted suggesting fatty infiltration. No focal abnormality is noted. IVC: No abnormality visualized. Pancreas: Visualized portion unremarkable. Spleen: Size and appearance within normal limits. Right Kidney: Length: 12.7 cm. Echogenicity within normal limits. No mass or hydronephrosis visualized. Left Kidney: Length: 12.6 cm. Echogenicity within normal limits. No hydronephrosis visualized. 3 cm complex cyst is seen in upper pole which is grossly stable compared to prior exam. Abdominal aorta: No aneurysm visualized. Other findings: None. IMPRESSION: Stable 3 cm complex cyst seen in upper pole of left kidney. Cholelithiasis is noted without definite evidence cholecystitis. Increased echogenicity of hepatic parenchyma is noted suggesting fatty infiltration or other diffuse hepatocellular disease. Electronically Signed   By: Marijo Conception, M.D.   On: 06/04/2015 18:23    Medications / Allergies:  Scheduled Meds: . amLODipine  10 mg Oral Daily  . atorvastatin  80 mg Oral q1800  . carvedilol  25 mg Oral BID WC  . clopidogrel  75 mg Oral Q breakfast  . folic acid  1 mg Oral Daily  . furosemide  40 mg Oral BID  . hydrALAZINE  50 mg Oral TID  . insulin aspart  0-9 Units Subcutaneous TID WC  . insulin glargine  65 Units Subcutaneous Daily  . isosorbide mononitrate  60 mg Oral Daily  . losartan  100 mg Oral Daily  . multivitamin with minerals  1 tablet Oral Daily  . pantoprazole  40 mg Oral Daily  . piperacillin-tazobactam (ZOSYN)  IV  3.375 g Intravenous 3 times per day  . potassium chloride SA  20 mEq Oral Daily  . sodium chloride   3 mL Intravenous Q12H  . spironolactone  25 mg Oral Daily  . thiamine  100 mg Oral Daily   Continuous Infusions:  PRN Meds:.sodium chloride, acetaminophen **OR** acetaminophen, HYDROmorphone (DILAUDID) injection, LORazepam **OR** LORazepam, nitroGLYCERIN, promethazine, sodium chloride, technetium TC 71M mebrofenin  Antibiotics: Anti-infectives    Start     Dose/Rate Route Frequency Ordered Stop   06/06/15 0945  piperacillin-tazobactam (ZOSYN) IVPB 3.375 g     3.375 g 12.5 mL/hr over 240 Minutes Intravenous 3 times per day 06/06/15 0942     06/04/15 2300  cefTRIAXone (ROCEPHIN) 2 g in dextrose 5 % 50 mL IVPB  Status:  Discontinued     2 g 100 mL/hr over 30 Minutes Intravenous  Every 24 hours 06/04/15 2222 06/06/15 0942        Assessment/Plan Acute Cholecystitis with cholelithiasis-IR cholecystostomy tube when INR is closer to 1.5. Check INR.  I will change him from rocephin to zosyn in addition to increasing his pain meds and changing diet to clear liquids. Repeat labs in AM. CV-CAD s/p DES x2 05/03/15, on plavix.  CHF, PAF, HTN, coumadin on hold, INR supratherapeutic, do not see recent labs.  Not sure if cards has been consulted yet. ?can the patient be reversed or let it drift down. Will leave up to primary team.  Fatty liver-LFTs okay 10/22 OSA-CPAP DM FEN-would not advance diet Dispo-perc chole drain    Erby Pian, ANP-BC [facility] Pager [phone](7A-4:30P) For consults and floor pages call [phone](7A-4:30P)  06/06/2015 9:42 AM

## 2015-06-07 ENCOUNTER — Inpatient Hospital Stay (HOSPITAL_COMMUNITY): Payer: Medicare Other

## 2015-06-07 ENCOUNTER — Encounter (HOSPITAL_COMMUNITY): Payer: Self-pay | Admitting: General Practice

## 2015-06-07 DIAGNOSIS — K810 Acute cholecystitis: Secondary | ICD-10-CM | POA: Insufficient documentation

## 2015-06-07 LAB — GLUCOSE, CAPILLARY
GLUCOSE-CAPILLARY: 118 mg/dL — AB (ref 65–99)
GLUCOSE-CAPILLARY: 138 mg/dL — AB (ref 65–99)
Glucose-Capillary: 124 mg/dL — ABNORMAL HIGH (ref 65–99)
Glucose-Capillary: 127 mg/dL — ABNORMAL HIGH (ref 65–99)

## 2015-06-07 LAB — CBC
HEMATOCRIT: 40.1 % (ref 39.0–52.0)
Hemoglobin: 13.3 g/dL (ref 13.0–17.0)
MCH: 27.9 pg (ref 26.0–34.0)
MCHC: 33.2 g/dL (ref 30.0–36.0)
MCV: 84.1 fL (ref 78.0–100.0)
PLATELETS: 151 10*3/uL (ref 150–400)
RBC: 4.77 MIL/uL (ref 4.22–5.81)
RDW: 13.9 % (ref 11.5–15.5)
WBC: 14.8 10*3/uL — AB (ref 4.0–10.5)

## 2015-06-07 LAB — COMPREHENSIVE METABOLIC PANEL
ALBUMIN: 3.1 g/dL — AB (ref 3.5–5.0)
ALT: 15 U/L — ABNORMAL LOW (ref 17–63)
ANION GAP: 11 (ref 5–15)
AST: 19 U/L (ref 15–41)
Alkaline Phosphatase: 58 U/L (ref 38–126)
BILIRUBIN TOTAL: 1.3 mg/dL — AB (ref 0.3–1.2)
BUN: 12 mg/dL (ref 6–20)
CHLORIDE: 99 mmol/L — AB (ref 101–111)
CO2: 27 mmol/L (ref 22–32)
Calcium: 9.1 mg/dL (ref 8.9–10.3)
Creatinine, Ser: 1.39 mg/dL — ABNORMAL HIGH (ref 0.61–1.24)
GFR calc Af Amer: >60 mL/min (ref >60)
GFR calc non Af Amer: 53 mL/min — ABNORMAL LOW (ref >60)
GLUCOSE: 136 mg/dL — AB (ref 65–99)
Potassium: 3.6 mmol/L (ref 3.5–5.1)
SODIUM: 137 mmol/L (ref 135–145)
TOTAL PROTEIN: 7.1 g/dL (ref 6.5–8.1)

## 2015-06-07 LAB — PROTIME-INR
INR: 1.61 — ABNORMAL HIGH (ref 0.00–1.49)
INR: 1.56 — AB (ref 0.00–1.49)
PROTHROMBIN TIME: 18.7 s — AB (ref 11.6–15.2)
Prothrombin Time: 19.2 seconds — ABNORMAL HIGH (ref 11.6–15.2)

## 2015-06-07 LAB — GRAM STAIN

## 2015-06-07 LAB — HEPATITIS PANEL, ACUTE
HCV Ab: <0.1 s/co ratio (ref 0.0–0.9)
Hep A IgM: NEGATIVE
Hep B C IgM: NEGATIVE
Hepatitis B Surface Ag: NEGATIVE

## 2015-06-07 MED ORDER — LIDOCAINE HCL 1 % IJ SOLN
INTRAMUSCULAR | Status: AC
  Filled 2015-06-07: qty 20

## 2015-06-07 MED ORDER — FENTANYL CITRATE (PF) 100 MCG/2ML IJ SOLN
INTRAMUSCULAR | Status: AC | PRN
  Administered 2015-06-07 (×2): 50 ug via INTRAVENOUS

## 2015-06-07 MED ORDER — MIDAZOLAM HCL 2 MG/2ML IJ SOLN
INTRAMUSCULAR | Status: AC
  Filled 2015-06-07: qty 6

## 2015-06-07 MED ORDER — IOHEXOL 300 MG/ML  SOLN
50.0000 mL | Freq: Once | INTRAMUSCULAR | Status: DC | PRN
  Administered 2015-06-07: 10 mL via INTRAVENOUS
  Filled 2015-06-07: qty 50

## 2015-06-07 MED ORDER — FENTANYL CITRATE (PF) 100 MCG/2ML IJ SOLN
INTRAMUSCULAR | Status: AC
  Filled 2015-06-07: qty 4

## 2015-06-07 MED ORDER — WARFARIN - PHARMACIST DOSING INPATIENT: Freq: Every day | Status: DC

## 2015-06-07 MED ORDER — MIDAZOLAM HCL 2 MG/2ML IJ SOLN
INTRAMUSCULAR | Status: AC | PRN
  Administered 2015-06-07 (×2): 1 mg via INTRAVENOUS

## 2015-06-07 MED ORDER — HYDROMORPHONE HCL 1 MG/ML IJ SOLN
INTRAMUSCULAR | Status: AC | PRN
  Administered 2015-06-07: 1 mg via INTRAVENOUS

## 2015-06-07 MED ORDER — VITAMIN K1 10 MG/ML IJ SOLN
5.0000 mg | Freq: Once | INTRAMUSCULAR | Status: AC
  Administered 2015-06-07: 5 mg via SUBCUTANEOUS
  Filled 2015-06-07: qty 0.5

## 2015-06-07 MED ORDER — HYDROMORPHONE HCL 1 MG/ML IJ SOLN
INTRAMUSCULAR | Status: AC
  Filled 2015-06-07: qty 1

## 2015-06-07 MED ORDER — WARFARIN SODIUM 5 MG PO TABS
5.0000 mg | ORAL_TABLET | Freq: Once | ORAL | Status: AC
  Administered 2015-06-07: 5 mg via ORAL
  Filled 2015-06-07: qty 1

## 2015-06-07 NOTE — Procedures (Signed)
Interventional Radiology Procedure Note  Procedure: Placement of 11F cholecystostomy tube.   Complications: None  Estimated Blood Loss: 0  Recommendations:   - Drain to gravity bag - Return to IR in 6-8 weeks for GB check and change - Given calculous cholecystitis, may need tube in place until safe for surgery  Signed,  Criselda Peaches, MD

## 2015-06-07 NOTE — Progress Notes (Signed)
Family Medicine Teaching Service Daily Progress Note Intern Pager: [phone]  Patient name: [name] [name] Medical record number: [id number] Date of birth: 1955-10-02 Age: 59 y.o. Gender: male  Primary Care Provider: Angelica Chessman, MD Consultants: surgery, IR Code Status: Full   Assessment and Plan: [name] [name] is a 59 y.o. male presenting with Symptomatic Cholelithiasis. PMH is significant for CAD s/p DES x 2 in the RCA (9/16), CHF rEF mixed etiology though most recent Echo EF 55-60%, DMII, HTN, OSA (Needs CPAP device at home), Paroxysmal A Fib (hx of A flutter s/p ablation '07/'08), Chronic Anticoagulation, HLD, Gout, GERD, ? Chronic Hep B.   Acute Cholecysitis - Pt complaining of RUQ pain x 3 days. IS worse with meals. No fever, no chills. No diarrhea. WBC - 13.8, Bili - 0.1, LFT's otherwise normal, Lipase 30. PT/INR elevated 2/2 coumadin. RUQ U/S with 40mm stone in the neck of the gallbladder. TTP in the RUQ on exam. Pain requiring IV pain medicine for control. HIDA scan revealed cystic duct obstruction and acute cholecystitis - Surgery and IR consulted, appreciate their assistance. INR 1.6 this morning. IR would prefer INR be 1.5 or less, will continue to monitor    - NPO now - Empiric Ceftriaxone (10/22-10/23), Zosyn started per surgery (10/24 -  ) - Hold Coumadin  - Pain control with PRN Dilaudid 1 mg q2 - Phenergan for nausea   CAD s/p DES x 2 in the RCA - Pt. With some "twinges in his left chest radiating down his left arm" in the midst of his RUQ pain. No active chest pain or SOB at this time. He does get SOB with exertion at baseline. No LE edema. No crackles on lung exam. Troponin negative in the ED. Has been taking ASA, Plavix, Coumadin at home. INR Supra therapeutic. EKG no changes, Troponin neg x3.  - Continue Plavix.  - hold aspirin and coumadin for now.  - Will continue to let his INR drift down to therapeutic range  - Cardiology has been contacted; Dr. Martinique  said if patient were to have surgery, could continue Plavix, just stop Coumadin.   CHFrEF : Mixed etiology ETOH? / Ischemic. Last EF noted 55-60% with heart cath. Managed medically as an outpatient. No LE swelling, or lung exam findings at this time.  - Hold Lasix for now - Will monitor fluid status closely.  - I/O's - Daily weights.   Paroxysmal Afib (Hx of A-Flutter s/p Ablation 2637,8588); Hx of RVR at one time as well. Normal sinus rhythm here. On coumadin.  - Continue medical management.  - Letting INR drift down.  - Monitor PT/INR.   DMII - A1C 8 at last check.  - Hold Lantus 65 u q am while NPO - Moderate SSI.  - CBG monitoring.   HTN - BP elevated here. - Continue Coreg, Hydralazine, Imdur, and Losartan, Lasix, Spironolactone. KDUR 20 meq given daily.  - Continue to monitor.   OSA - symptoms include waking up at night gasping for breath, but no particular orthopnea. He says that he is waiting for his machine to be delivered to his house.  - CPAP here. Autotitrate.   GERD- continue protonix.   HLD- continue lipitor  Hx of ETOH Abuse - says that he has been cutting back on his drinking - says he drinks 1-2 drinks every other day now. Used to drink much more and says he had an ETOH abuse problem. Also MJ, but otherwise no other substances.  - CIWA protocol   ?  Chronic Hep B - listed in chart. No labs to support this. Pt. Without knowledge of this diagnosis. Hep Panel negative - LFT's appropriate.   FEN/GI: NPO, Protonix Prophylaxis: Just Plavix, allowing INR to trend down for surgery    Disposition: Pending surgery/IR recs  Subjective:  Patient has no acute events overnight. Admits to continued abdominal pain, mostly RUQ. Denies nausea, vomiting, diarrhea. Denies chest pain at this time. Patient is hopeful that IR will do the procedure later today. Patient has good understanding that we are waiting for his INR to come down.   Objective: Temp:  [98.8 F  (37.1 C)-100 F (37.8 C)] 100 F (37.8 C) (10/25 0600) Pulse Rate:  [85-92] 92 (10/25 0600) Resp:  [18] 18 (10/25 0600) BP: (115-134)/(58-70) 115/58 mmHg (10/25 0600) SpO2:  [92 %-98 %] 92 % (10/25 0600) Physical Exam: General: NAD, AAOx3, resting comfortably in bed Cardiovascular: RRR, no MGR, normal S1/S2 Respiratory: CTA bilaterally, no increased work of breathing Abdomen:soft, distended, tenderness to palpation of RUQ and LLQ, positive Murphy sign. Hypoactive bowel sounds Extremities: no edema  Laboratory:  Recent Labs Lab 06/05/15 0316 06/06/15 1516 06/07/15 0417  WBC 13.8* 12.3* 14.8*  HGB 14.1 13.6 13.3  HCT 41.8 41.2 40.1  PLT 137* 132* 151    Recent Labs Lab 06/04/15 1612 06/05/15 0316 06/06/15 1516 06/07/15 0417  NA  --  133* 137 137  K  --  3.6 3.8 3.6  CL  --  100* 100* 99*  CO2  --  25 29 27   BUN  --  8 13 12   CREATININE  --  1.00 1.37* 1.39*  CALCIUM  --  8.7* 8.9 9.1  PROT 7.3  --  6.9 7.1  BILITOT 0.5  --  1.1 1.3*  ALKPHOS 48  --  47 58  ALT 14*  --  13* 15*  AST 19  --  16 19  GLUCOSE  --  168* 140* 136*    Troponin (Point of Care Test)  Recent Labs  06/04/15 1841  TROPIPOC 0.01    Imaging/Diagnostic Tests: No results found.   Carlyle Dolly, MD 06/07/2015, 8:10 AM PGY-1, [place] Intern pager: [phone], text pages welcome

## 2015-06-07 NOTE — Consult Note (Signed)
Chief Complaint: Patient was seen in consultation today for percutaneous chole drain placement Chief Complaint  Patient presents with  . Chest Pain  . Abdominal Pain   at the request of Dr Georgette Dover  Referring Physician(s): CCS/Tsuei  History of Present Illness: [name] [name] is a 59 y.o. male   Admitted through ED 10/22 with RUQ pain N/V/fever US reveals cholelithiasis and + HIDA IMPRESSION: Normal hepatocellular function.  Patent CBD.  Nonvisualization of the gallbladder despite morphine augmentation consistent with cystic duct obstruction and acute cholecystitis.  Still with RUQ pan; fever; Nausea  Pt has had recent CAD-  DES x 2 placed 04/2015 and is on Plavix daily Also on Coumadin for afib Coumadin stopped with INR now 1.61 Still on Plavix daily secondary recent DES: Last dose 8/25  Request for percutaneous cholecystostomy drain placement per Dr Warnell Bureau not best surgical candidate with ongoing Plavix and new stents Dr Earleen Newport has reviewed imaging and approves procedure when INR appropriate Cardio - Dr Martinique has been consulted about Plavix---ok to come off coumadin--not Plavix Plan to move ahead asap after discussing with Dr Laurence Ferrari INR of 1.6  Past Medical History  Diagnosis Date  . Hypertension   . Gout   . PAF (paroxysmal atrial fibrillation) (HCC)     On coumadin  . LIVER FUNCTION TESTS, ABNORMAL, HX OF 12/30/2006  . Rectal bleeding 12/18/2011    Scheduled for colonoscopy.    [name] HEPATITIS B, CHRONIC 12/30/2006  . HYPERCHOLESTEROLEMIA 07/11/2010  . MITRAL REGURGITATION 12/30/2006  . COLONIC POLYPS, HX OF 12/30/2006  . CAD (coronary artery disease), native coronary artery     a. Nonobstructive CAD by cath 2013 - diffuse distal and branch vessel CAD, no severe disease in the major coronaries, LV mild global hypokinesis, EF 45%. b. ETT-Sestamibi 5/14: EF 31%, small fixed inferior defect with no ischemia.  . Chronic CHF ([place])     a. Mixed ICM/NICM  (?EtOH). EF 35% in 2008. Echo 5/13: EF 60-65%, mod LVH, EF 45% on V gram in 12/2011. EF 12/2012: EF 50-55%, mild LVH, inferobasal HK, mild MR. ETT-Ses 5/14 EF 41%. Cardiac MRI 5/14: EF 44%, mild global HK, subepicardial delayed enhancement in nonspecific RV insertion pattern.  . H/O atrial flutter 2007    a. Ablations in 2007, 2008.  [name] History of medication noncompliance   . History of alcohol abuse   . 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 Heart murmur   . Anginal pain ([place])   . Sleep apnea     "suppose to send mask but they never did" (05/03/2015)  . Type II diabetes mellitus ([place])   . Left sciatic nerve pain since 04/2015  . Anxiety     Past Surgical History  Procedure Laterality Date  . Osteochondroma excision Left 1972    "took bone tumor off my shoulder"  . Coronary angioplasty  12/05/01  . A flutter ablation  2007, 2008    catheter ablation   . Left heart catheterization with coronary angiogram N/A 12/19/2011    Procedure: LEFT HEART CATHETERIZATION WITH CORONARY ANGIOGRAM;  Surgeon: Larey Dresser, MD;  Location[facility] CATH LAB;  Service: Cardiovascular;  Laterality: N/A;  . Cardiac catheterization N/A 05/03/2015    Procedure: Right/Left Heart Cath and Coronary Angiography;  Surgeon: Larey Dresser, MD;  Location: [place] CV LAB;  Service: Cardiovascular;  Laterality: N/A;  . Cardiac catheterization N/A 05/03/2015    Procedure: Coronary Stent Intervention;  Surgeon: Jettie Booze,  MD;  Location: [place] CV LAB;  Service: Cardiovascular;  Laterality: N/A;    Allergies: Other and Ace inhibitors  Medications: Prior to Admission medications   Medication Sig Start Date End Date Taking? Authorizing Provider  acetaminophen (TYLENOL) 500 MG tablet Take 1,000 mg by mouth every 6 (six) hours as needed (pain).   Yes Historical Provider, MD  amLODipine (NORVASC) 10 MG tablet Take 1 tablet (10 mg total) by mouth daily. Patient taking differently: Take 10 mg by  mouth at bedtime.  05/18/14  Yes Tresa Garter, MD  aspirin 81 MG chewable tablet Chew 1 tablet (81 mg total) by mouth daily. 05/04/15  Yes Almyra Deforest, PA  atorvastatin (LIPITOR) 80 MG tablet Take 1 tablet (80 mg total) by mouth daily. 04/29/15  Yes Larey Dresser, MD  bismuth subsalicylate (PEPTO BISMOL) 262 MG/15ML suspension Take 30 mLs by mouth every 6 (six) hours as needed for indigestion (stomach pain).   Yes Historical Provider, MD  carvedilol (COREG) 25 MG tablet Take 1 tablet (25 mg total) by mouth 2 (two) times daily with a meal. 05/18/14  Yes Tresa Garter, MD  clopidogrel (PLAVIX) 75 MG tablet Take 1 tablet (75 mg total) by mouth daily with breakfast. 05/04/15  Yes Almyra Deforest, PA  furosemide (LASIX) 40 MG tablet Take 1 tablet by mouth in the AM and 1/2 tablet (20mg ) by mouth in the PM Patient taking differently: Take 40 mg by mouth 2 (two) times daily.  04/22/15  Yes Larey Dresser, MD  hydrALAZINE (APRESOLINE) 50 MG tablet Take 1 tablet (50 mg total) by mouth 3 (three) times daily. Patient taking differently: Take 50 mg by mouth 2 (two) times daily.  04/22/15  Yes Larey Dresser, MD  insulin glargine (LANTUS) 100 UNIT/ML injection Inject 0.65 mLs (65 Units total) into the skin every morning. Patient taking differently: Inject 65 Units into the skin daily.  06/02/15  Yes Renato Shin, MD  isosorbide mononitrate (IMDUR) 60 MG 24 hr tablet Take 1 tablet (60 mg total) by mouth daily. Patient taking differently: Take 60 mg by mouth at bedtime.  05/18/14  Yes Tresa Garter, MD  losartan (COZAAR) 100 MG tablet Take 1 tablet (100 mg total) by mouth daily. Patient taking differently: Take 100 mg by mouth at bedtime.  05/18/14  Yes Tresa Garter, MD  metFORMIN (GLUCOPHAGE) 1000 MG tablet Take 1 tablet (1,000 mg total) by mouth 2 (two) times daily with a meal. 05/18/14  Yes Tresa Garter, MD  nitroGLYCERIN (NITROSTAT) 0.4 MG SL tablet Place 1 tablet (0.4 mg total) under the tongue  every 5 (five) minutes as needed for chest pain. 05/04/15  Yes Almyra Deforest, PA  spironolactone (ALDACTONE) 25 MG tablet Take 1 tablet (25 mg total) by mouth daily. Patient taking differently: Take 25 mg by mouth at bedtime.  04/22/15  Yes Larey Dresser, MD  warfarin (COUMADIN) 5 MG tablet Take 1 tablet (5 mg total) by mouth as directed. Patient taking differently: Take 2.5-5 mg by mouth at bedtime. Take 1/2 tablet (2.5 mg) by mouth on Wednesday, take 1 tablet (5 mg) on Sunday, Monday, Tuesday, Thursday, Friday, Saturday 05/18/14  Yes Tresa Garter, MD  HYDROcodone-acetaminophen (NORCO/VICODIN) 5-325 MG per tablet Take 1 tablet by mouth every 6 (six) hours as needed. Patient not taking: Reported on 06/04/2015 12/14/14   Junius Creamer, NP  pantoprazole (PROTONIX) 40 MG tablet Take 1 tablet (40 mg total) by mouth daily. Patient not taking: Reported  on 06/04/2015 05/04/15   Almyra Deforest, PA  potassium chloride SA (KLOR-CON M20) 20 MEQ tablet Take 1 tablet (20 mEq total) by mouth daily. Patient not taking: Reported on 05/26/2015 04/22/15   Larey Dresser, MD     Family History  Problem Relation Age of Onset  . Diabetes Mother   . Hypertension Mother   . Heart attack Neg Hx   . Stroke Neg Hx     Social History   Social History  . Marital Status: Widowed    Spouse Name: N/A  . Number of Children: 2  . Years of Education: N/A   Occupational History  .     Social History Main Topics  . Smoking status: Former Smoker -- 4.00 packs/day for 18 years    Types: Cigarettes    Quit date: 08/14/1983  . Smokeless tobacco: Never Used  . Alcohol Use: 9.6 oz/week    10 Shots of liquor, 6 Cans of beer per week     Comment: 05/03/2015 "1-2 beers q 3 days; 4-5 shots of liquor twice/wk"  . Drug Use: Yes    Special: Marijuana     Comment: 05/03/2015 "twice/wk maybe"  . Sexual Activity: No   Other Topics Concern  . None   Social History Narrative    Review of Systems: A 12 point ROS discussed and  pertinent positives are indicated in the HPI above.  All other systems are negative.  Review of Systems  Constitutional: Positive for fever, activity change, appetite change and fatigue.  Respiratory: Negative for cough, chest tightness and shortness of breath.   Gastrointestinal: Positive for nausea, vomiting and abdominal pain.  Genitourinary: Negative for difficulty urinating.  Neurological: Positive for weakness.  Psychiatric/Behavioral: Negative for behavioral problems and confusion.    Vital Signs: BP 115/58 mmHg  Pulse 92  Temp(Src) 100 F (37.8 C) (Oral)  Resp 18  Ht 6' (1.829 m)  Wt 212 lb (96.163 kg)  BMI 28.75 kg/m2  SpO2 92%  Physical Exam  Constitutional: He is oriented to person, place, and time.  Cardiovascular: Normal rate, regular rhythm and normal heart sounds.   Pulmonary/Chest: Effort normal and breath sounds normal. He has no wheezes.  Abdominal: Soft. Bowel sounds are normal. There is tenderness.  Musculoskeletal: Normal range of motion.  Neurological: He is alert and oriented to person, place, and time.  Skin: Skin is warm and dry.  Psychiatric: He has a normal mood and affect. His behavior is normal. Judgment and thought content normal.  Nursing note and vitals reviewed.   Mallampati Score:  MD Evaluation Airway: WNL Heart: WNL Abdomen: WNL Chest/ Lungs: WNL ASA  Classification: 3 Mallampati/Airway Score: One  Imaging: Dg Chest 2 View  06/04/2015  CLINICAL DATA:  Intermittent chest pain and shortness of breath for several days, former smoker, type II diabetes mellitus, CHF, hypertension, history osteochondrosarcoma EXAM: CHEST  2 VIEW COMPARISON:  10/05/2013 FINDINGS: Enlargement of cardiac silhouette. Elongation of thoracic aorta. Mediastinal contours and pulmonary vascularity normal. Peribronchial thickening with an chronic accentuation of medial RIGHT lung base markings. No definite acute infiltrate, pleural effusion or pneumothorax. No acute  osseous findings. IMPRESSION: Enlargement of cardiac silhouette. Chronic bronchitic changes and chronic accentuation of medial RIGHT lung base markings without acute infiltrate. Electronically Signed   By: Lavonia Dana M.D.   On: 06/04/2015 15:42   Nm Hepatobiliary Liver Func  06/05/2015  CLINICAL DATA:  Symptomatic cholelithiasis, RIGHT upper quadrant pain, nausea, vomiting EXAM: NUCLEAR MEDICINE HEPATOBILIARY IMAGING TECHNIQUE: Sequential  images of the abdomen were obtained out to 60 minutes following intravenous administration of radiopharmaceutical. Examination was initially planned for assessment of gallbladder function following CCK administration. However, the gallbladder was not visualized during the first 90 minutes of imaging. Patient was then reinjected with tracer and 3.9 millicurie items of morphine sulfate IV and an additional 30 minutes of imaging was performed to assess for patency of the cystic duct. This precluded subsequent CCK administration. RADIOPHARMACEUTICALS:  5 millicuries + 2 mCi XN-17G Choletec IV as above COMPARISON:  Ultrasound abdomen 06/04/2015, CT abdomen and pelvis 01/11/2007 FINDINGS: Normal tracer extraction indicating normal hepatocellular function. Prompt excretion of tracer into the biliary tree. Small bowel is visualized by 6 minutes. At 1 hour, gallbladder was not definitely visualized. Lateral view demonstrated a small amount of subhepatic tracer anteriorly, by subsequent image likely biliary reflux into stomach, not gallbladder. Following morphine and reinjection of tracer, CBD tracer is again identified. Gallbladder did not visualize following morphine augmentation. Findings are consistent with cystic duct obstruction and acute cholecystitis. IMPRESSION: Normal hepatocellular function. Patent CBD. Nonvisualization of the gallbladder despite morphine augmentation consistent with cystic duct obstruction and acute cholecystitis. Electronically Signed   By: Lavonia Dana M.D.    On: 06/05/2015 12:18   US Abdomen Complete  06/04/2015  CLINICAL DATA:  Acute right upper quadrant abdominal pain. EXAM: ULTRASOUND ABDOMEN COMPLETE COMPARISON:  Ultrasound of November 13, 2013. FINDINGS: Gallbladder: Multiple gallstones are noted, with the largest measuring 2.7 cm. 8 mm gallstone is lodged in the neck of the gallbladder. No gallbladder wall thickening or pericholecystic fluid is noted. No sonographic Murphy's sign is noted. Common bile duct: Diameter: 4.9 mm which is within normal limits. Liver: Increased echogenicity is noted suggesting fatty infiltration. No focal abnormality is noted. IVC: No abnormality visualized. Pancreas: Visualized portion unremarkable. Spleen: Size and appearance within normal limits. Right Kidney: Length: 12.7 cm. Echogenicity within normal limits. No mass or hydronephrosis visualized. Left Kidney: Length: 12.6 cm. Echogenicity within normal limits. No hydronephrosis visualized. 3 cm complex cyst is seen in upper pole which is grossly stable compared to prior exam. Abdominal aorta: No aneurysm visualized. Other findings: None. IMPRESSION: Stable 3 cm complex cyst seen in upper pole of left kidney. Cholelithiasis is noted without definite evidence cholecystitis. Increased echogenicity of hepatic parenchyma is noted suggesting fatty infiltration or other diffuse hepatocellular disease. Electronically Signed   By: Marijo Conception, M.D.   On: 06/04/2015 18:23    Labs:  CBC:  Recent Labs  06/04/15 1438 06/05/15 0316 06/06/15 1516 06/07/15 0417  WBC 6.7 13.8* 12.3* 14.8*  HGB 14.6 14.1 13.6 13.3  HCT 43.4 41.8 41.2 40.1  PLT 134* 137* 132* 151    COAGS:  Recent Labs  06/05/15 0316 06/06/15 1154 06/06/15 1516 06/07/15 0417  INR 3.69* 3.45* 3.40* 1.61*    BMP:  Recent Labs  06/04/15 1438 06/05/15 0316 06/06/15 1516 06/07/15 0417  NA 134* 133* 137 137  K 3.5 3.6 3.8 3.6  CL 100* 100* 100* 99*  CO2 26 25 29 27   GLUCOSE 174* 168* 140* 136*   BUN 8 8 13 12   CALCIUM 9.3 8.7* 8.9 9.1  CREATININE 1.04 1.00 1.37* 1.39*  GFRNONAA >60 >60 54* 53*  GFRAA >60 >60 >60 >60    LIVER FUNCTION TESTS:  Recent Labs  06/04/15 1612 06/06/15 1516 06/07/15 0417  BILITOT 0.5 1.1 1.3*  AST 19 16 19   ALT 14* 13* 15*  ALKPHOS 48 47 58  PROT 7.3 6.9  7.1  ALBUMIN 3.8 3.1* 3.1*    TUMOR MARKERS: No results for input(s): AFPTM, CEA, CA199, CHROMGRNA in the last 8760 hours.  Assessment and Plan:  RUQ pain; cholecystitis Leukocytosis Fever +HIDA Scheduled for percutaneous cholecystostomy drain in IR when can safely move ahead INR 1.6 today---will discuss with Dr Laurence Ferrari Risks and Benefits discussed with the patient including, but not limited to bleeding, infection, gallbladder perforation, bile leak, sepsis or even death. All of the patient's questions were answered, patient is agreeable to proceed. Consent signed and in chart.   Thank you for this interesting consult.  I greatly enjoyed meeting Carlester Kasparek and look forward to participating in their care.  A copy of this report was sent to the requesting provider on this date.  Signed: Zoey Gilkeson A 06/07/2015, 7:42 AM   I spent a total of 40 Minutes    in face to face in clinical consultation, greater than 50% of which was counseling/coordinating care for perc chole drain

## 2015-06-07 NOTE — Progress Notes (Signed)
ANTICOAGULATION CONSULT NOTE - Initial Consult  Pharmacy Consult for Coumadin  Indication: Afib  Allergies  Allergen Reactions  . Other Hives    Patient reports developing hives after receiving "some antibiotic given in 1980''s at [facility]". He does not know which antibiotic.  [name] Ace Inhibitors Cough    Patient Measurements: Height: 6' (182.9 cm) Weight: 212 lb (96.163 kg) IBW/kg (Calculated) : 77.6  Vital Signs: Temp: 100 F (37.8 C) (10/25 0600) Temp Source: Oral (10/25 0600) BP: 153/86 mmHg (10/25 1516) Pulse Rate: 88 (10/25 1516)  Labs:  Recent Labs  06/04/15 2201  06/05/15 0316 06/05/15 1430  06/06/15 1516 06/07/15 0417 06/07/15 1223  HGB  --   < > 14.1  --   --  13.6 13.3  --   HCT  --   --  41.8  --   --  41.2 40.1  --   PLT  --   --  137*  --   --  132* 151  --   LABPROT  --   --  35.8*  --   < > 33.6* 19.2* 18.7*  INR  --   --  3.69*  --   < > 3.40* 1.61* 1.56*  CREATININE  --   --  1.00  --   --  1.37* 1.39*  --   TROPONINI <0.03  --  <0.03 0.03  --   --   --   --   < > = values in this interval not displayed.  Estimated Creatinine Clearance: 67.1 mL/min (by C-G formula based on Cr of 1.39).   Medical History: Past Medical History  Diagnosis Date  . Hypertension   . Gout   . PAF (paroxysmal atrial fibrillation) (HCC)     On coumadin  . LIVER FUNCTION TESTS, ABNORMAL, HX OF 12/30/2006  . Rectal bleeding 12/18/2011    Scheduled for colonoscopy.    [name] HEPATITIS B, CHRONIC 12/30/2006  . HYPERCHOLESTEROLEMIA 07/11/2010  . MITRAL REGURGITATION 12/30/2006  . COLONIC POLYPS, HX OF 12/30/2006  . CAD (coronary artery disease), native coronary artery     a. Nonobstructive CAD by cath 2013 - diffuse distal and branch vessel CAD, no severe disease in the major coronaries, LV mild global hypokinesis, EF 45%. b. ETT-Sestamibi 5/14: EF 31%, small fixed inferior defect with no ischemia.  . Chronic CHF ([place])     a. Mixed ICM/NICM (?EtOH). EF 35% in 2008.  Echo 5/13: EF 60-65%, mod LVH, EF 45% on V gram in 12/2011. EF 12/2012: EF 50-55%, mild LVH, inferobasal HK, mild MR. ETT-Ses 5/14 EF 41%. Cardiac MRI 5/14: EF 44%, mild global HK, subepicardial delayed enhancement in nonspecific RV insertion pattern.  . H/O atrial flutter 2007    a. Ablations in 2007, 2008.  [name] History of medication noncompliance   . History of alcohol abuse   . 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 Heart murmur   . Anginal pain ([place])   . Sleep apnea     "suppose to send mask but they never did" (05/03/2015)  . Type II diabetes mellitus ([place])   . Left sciatic nerve pain since 04/2015  . Anxiety   . Cholecystitis     Assessment: 59 yo M presents on 10/22 with cholelithiasis. Also had elevated INR on admission of 4.49. Last dose was 10/22. Coumadin was held until and now to restart on 10/25 after cholecystostomy tube placed. INR today is subtherapeutic at 1.56. CBC stable, no s/s  of bleed.  PTA Coumadin is 5mg  daily exc 2.5mg  on Wed.  Goal of Therapy:  INR 2-3 Monitor platelets by anticoagulation protocol: Yes   Plan:  Give coumadin 5mg  PO x 1 tonight Monitor daily INR, CBC, s/s of bleed   Abigail Teall J 06/07/2015,5:36 PM

## 2015-06-07 NOTE — Progress Notes (Signed)
Patient ID: [name] [name], male   DOB: Jun 24, 1956, 59 y.o.   MRN: [id number]     [facility]      [address] [place], [place] [address]    Phone: [phone] FAX: [phone]     Subjective: Leukocytosis, fevers, continued pain.   Objective:  Vital signs:  Filed Vitals:   06/06/15 1418 06/06/15 2047 06/06/15 2114 06/07/15 0600  BP: 126/60 134/70  115/58  Pulse: 85 88  92  Temp: 99.3 F (37.4 C) 98.8 F (37.1 C) 98.8 F (37.1 C) 100 F (37.8 C)  TempSrc: Oral Oral  Oral  Resp: _0 Height:      Weight:      SpO2: 96% 98%  92%    Last BM Date:  (pt states "3 days ago")  Intake/Output   Yesterday:  10/24 0701 - 10/25 0700 In: -  Out: 400 [Urine:400] This shift:    I/O last 3 completed shifts: In: 240 [P.O.:240] Out: 400 [Urine:400]    Physical Exam: General: Pt awake/alert/oriented x4 in no acute distress Abdomen: Soft. Nondistended. TTP RUQ. No evidence of peritonitis. No incarcerated hernias.   Problem List:   Active Problems:   Cholelithiases   Cholelithiasis   Calculus of gallbladder without cholecystitis without obstruction   RUQ pain   Coronary artery disease involving native coronary artery of native heart without angina pectoris   Acute cholecystitis    Results:   Labs: Results for orders placed or performed during the hospital encounter of 06/04/15 (from the past 48 hour(s))  Glucose, capillary     Status: Abnormal   Collection Time: 06/05/15 12:38 PM  Result Value Ref Range   Glucose-Capillary 156 (H) 65 - 99 mg/dL   Comment 1 Notify RN   Troponin I (q 6hr x 3)     Status: None   Collection Time: 06/05/15  2:30 PM  Result Value Ref Range   Troponin I 0.03 <0.031 ng/mL    Comment:        NO INDICATION OF MYOCARDIAL INJURY.   Glucose, capillary     Status: Abnormal   Collection Time: 06/05/15  4:40 PM  Result Value Ref Range   Glucose-Capillary 152 (H) 65 - 99 mg/dL   Glucose, capillary     Status: Abnormal   Collection Time: 06/05/15  9:34 PM  Result Value Ref Range   Glucose-Capillary 166 (H) 65 - 99 mg/dL   Comment 1 Notify RN    Comment 2 Document in Chart   Hepatitis panel, acute     Status: None   Collection Time: 06/06/15  2:59 AM  Result Value Ref Range   Hepatitis B Surface Ag Negative Negative   HCV Ab <0.1 0.0 - 0.9 s/co ratio    Comment: (NOTE)                                  Negative:     < 0.8                             Indeterminate: 0.8 - 0.9                                  Positive:     > 0.9 The CDC recommends  that a positive HCV antibody result be followed up with a HCV Nucleic Acid Amplification test (009446). Performed At: [facility] [place], [place] [id number] Lindon Romp MD [id number]    Hep A IgM Negative Negative   Hep B C IgM Negative Negative  Glucose, capillary     Status: Abnormal   Collection Time: 06/06/15  8:02 AM  Result Value Ref Range   Glucose-Capillary 146 (H) 65 - 99 mg/dL  Protime-INR     Status: Abnormal   Collection Time: 06/06/15 11:54 AM  Result Value Ref Range   Prothrombin Time 34.0 (H) 11.6 - 15.2 seconds   INR 3.45 (H) 0.00 - 1.49  Glucose, capillary     Status: Abnormal   Collection Time: 06/06/15 12:54 PM  Result Value Ref Range   Glucose-Capillary 165 (H) 65 - 99 mg/dL  Protime-INR     Status: Abnormal   Collection Time: 06/06/15  3:16 PM  Result Value Ref Range   Prothrombin Time 33.6 (H) 11.6 - 15.2 seconds   INR 3.40 (H) 0.00 - 1.49  CBC     Status: Abnormal   Collection Time: 06/06/15  3:16 PM  Result Value Ref Range   WBC 12.3 (H) 4.0 - 10.5 K/uL   RBC 4.88 4.22 - 5.81 MIL/uL   Hemoglobin 13.6 13.0 - 17.0 g/dL   HCT 41.2 39.0 - 52.0 %   MCV 84.4 78.0 - 100.0 fL   MCH 27.9 26.0 - 34.0 pg   MCHC 33.0 30.0 - 36.0 g/dL   RDW 13.8 11.5 - 15.5 %   Platelets 132 (L) 150 - 400 K/uL  Comprehensive metabolic panel     Status: Abnormal    Collection Time: 06/06/15  3:16 PM  Result Value Ref Range   Sodium 137 135 - 145 mmol/L   Potassium 3.8 3.5 - 5.1 mmol/L   Chloride 100 (L) 101 - 111 mmol/L   CO2 29 22 - 32 mmol/L   Glucose, Bld 140 (H) 65 - 99 mg/dL   BUN 13 6 - 20 mg/dL   Creatinine, Ser 1.37 (H) 0.61 - 1.24 mg/dL   Calcium 8.9 8.9 - 10.3 mg/dL   Total Protein 6.9 6.5 - 8.1 g/dL   Albumin 3.1 (L) 3.5 - 5.0 g/dL   AST 16 15 - 41 U/L   ALT 13 (L) 17 - 63 U/L   Alkaline Phosphatase 47 38 - 126 U/L   Total Bilirubin 1.1 0.3 - 1.2 mg/dL   GFR calc non Af Amer 54 (L) >60 mL/min   GFR calc Af Amer >60 >60 mL/min    Comment: (NOTE) The eGFR has been calculated using the CKD EPI equation. This calculation has not been validated in all clinical situations. eGFR's persistently <60 mL/min signify possible Chronic Kidney Disease.    Anion gap 8 5 - 15  Glucose, capillary     Status: Abnormal   Collection Time: 06/06/15  5:39 PM  Result Value Ref Range   Glucose-Capillary 123 (H) 65 - 99 mg/dL  Glucose, capillary     Status: Abnormal   Collection Time: 06/06/15 10:09 PM  Result Value Ref Range   Glucose-Capillary 169 (H) 65 - 99 mg/dL  CBC     Status: Abnormal   Collection Time: 06/07/15  4:17 AM  Result Value Ref Range   WBC 14.8 (H) 4.0 - 10.5 K/uL   RBC 4.77 4.22 - 5.81 MIL/uL   Hemoglobin 13.3 13.0 - 17.0 g/dL  HCT 40.1 39.0 - 52.0 %   MCV 84.1 78.0 - 100.0 fL   MCH 27.9 26.0 - 34.0 pg   MCHC 33.2 30.0 - 36.0 g/dL   RDW 13.9 11.5 - 15.5 %   Platelets 151 150 - 400 K/uL  Comprehensive metabolic panel     Status: Abnormal   Collection Time: 06/07/15  4:17 AM  Result Value Ref Range   Sodium 137 135 - 145 mmol/L   Potassium 3.6 3.5 - 5.1 mmol/L   Chloride 99 (L) 101 - 111 mmol/L   CO2 27 22 - 32 mmol/L   Glucose, Bld 136 (H) 65 - 99 mg/dL   BUN 12 6 - 20 mg/dL   Creatinine, Ser 1.39 (H) 0.61 - 1.24 mg/dL   Calcium 9.1 8.9 - 10.3 mg/dL   Total Protein 7.1 6.5 - 8.1 g/dL   Albumin 3.1 (L) 3.5 - 5.0 g/dL    AST 19 15 - 41 U/L   ALT 15 (L) 17 - 63 U/L   Alkaline Phosphatase 58 38 - 126 U/L   Total Bilirubin 1.3 (H) 0.3 - 1.2 mg/dL   GFR calc non Af Amer 53 (L) >60 mL/min   GFR calc Af Amer >60 >60 mL/min    Comment: (NOTE) The eGFR has been calculated using the CKD EPI equation. This calculation has not been validated in all clinical situations. eGFR's persistently <60 mL/min signify possible Chronic Kidney Disease.    Anion gap 11 5 - 15  Protime-INR     Status: Abnormal   Collection Time: 06/07/15  4:17 AM  Result Value Ref Range   Prothrombin Time 19.2 (H) 11.6 - 15.2 seconds   INR 1.61 (H) 0.00 - 1.49  Glucose, capillary     Status: Abnormal   Collection Time: 06/07/15  8:12 AM  Result Value Ref Range   Glucose-Capillary 118 (H) 65 - 99 mg/dL    Imaging / Studies: Nm Hepatobiliary Liver Func  06/05/2015  CLINICAL DATA:  Symptomatic cholelithiasis, RIGHT upper quadrant pain, nausea, vomiting EXAM: NUCLEAR MEDICINE HEPATOBILIARY IMAGING TECHNIQUE: Sequential images of the abdomen were obtained out to 60 minutes following intravenous administration of radiopharmaceutical. Examination was initially planned for assessment of gallbladder function following CCK administration. However, the gallbladder was not visualized during the first 90 minutes of imaging. Patient was then reinjected with tracer and 3.9 millicurie items of morphine sulfate IV and an additional 30 minutes of imaging was performed to assess for patency of the cystic duct. This precluded subsequent CCK administration. RADIOPHARMACEUTICALS:  5 millicuries + 2 mCi TD-32K Choletec IV as above COMPARISON:  Ultrasound abdomen 06/04/2015, CT abdomen and pelvis 01/11/2007 FINDINGS: Normal tracer extraction indicating normal hepatocellular function. Prompt excretion of tracer into the biliary tree. Small bowel is visualized by 6 minutes. At 1 hour, gallbladder was not definitely visualized. Lateral view demonstrated a small amount of  subhepatic tracer anteriorly, by subsequent image likely biliary reflux into stomach, not gallbladder. Following morphine and reinjection of tracer, CBD tracer is again identified. Gallbladder did not visualize following morphine augmentation. Findings are consistent with cystic duct obstruction and acute cholecystitis. IMPRESSION: Normal hepatocellular function. Patent CBD. Nonvisualization of the gallbladder despite morphine augmentation consistent with cystic duct obstruction and acute cholecystitis. Electronically Signed   By: Lavonia Dana M.D.   On: 06/05/2015 12:18    Medications / Allergies:  Scheduled Meds: . amLODipine  10 mg Oral Daily  . atorvastatin  80 mg Oral q1800  . carvedilol  25 mg  Oral BID WC  . clopidogrel  75 mg Oral Q breakfast  . folic acid  1 mg Oral Daily  . furosemide  40 mg Oral BID  . hydrALAZINE  50 mg Oral TID  . insulin aspart  0-9 Units Subcutaneous TID WC  . insulin glargine  65 Units Subcutaneous Daily  . isosorbide mononitrate  60 mg Oral Daily  . losartan  100 mg Oral Daily  . multivitamin with minerals  1 tablet Oral Daily  . pantoprazole  40 mg Oral Daily  . piperacillin-tazobactam (ZOSYN)  IV  3.375 g Intravenous 3 times per day  . potassium chloride SA  20 mEq Oral Daily  . sodium chloride  3 mL Intravenous Q12H  . spironolactone  25 mg Oral Daily  . thiamine  100 mg Oral Daily   Continuous Infusions:  PRN Meds:.sodium chloride, acetaminophen **OR** acetaminophen, HYDROmorphone (DILAUDID) injection, LORazepam **OR** LORazepam, nitroGLYCERIN, promethazine, sodium chloride, technetium TC 68M mebrofenin  Antibiotics: Anti-infectives    Start     Dose/Rate Route Frequency Ordered Stop   06/06/15 1000  piperacillin-tazobactam (ZOSYN) IVPB 3.375 g     3.375 g 12.5 mL/hr over 240 Minutes Intravenous 3 times per day 06/06/15 0942     06/04/15 2300  cefTRIAXone (ROCEPHIN) 2 g in dextrose 5 % 50 mL IVPB  Status:  Discontinued     2 g 100 mL/hr over 30  Minutes Intravenous Every 24 hours 06/04/15 2222 06/06/15 7414       Assessment/Plan Acute Cholecystitis with cholelithiasis-IR cholecystostomy tube possibly today pending repeat INR at 12. ID-zosyn D#1 CV-CAD s/p DES x2 05/03/15, on plavix. CHF, PAF, HTN.  Not a surgical candidate due to recent stents and need for continued to plavix Fatty liver OSA-CPAP DM FEN-may have clears post IR procedure  Dispo-perc chole drain   Erby Pian, ANP-BC [facility] Pager [phone](7A-4:30P) For consults and floor pages call [phone](7A-4:30P)  06/07/2015 8:43 AM

## 2015-06-07 NOTE — Progress Notes (Signed)
RT spoke with patient about wearing CPAP and patient stated he didn't want to wear CPAP  Tonight.  RT advised if he changed his mind to give respiratory a a call. RT will continue to monitor.

## 2015-06-07 NOTE — Care Management Important Message (Signed)
Important Message  Patient Details  Name: [name] [name] MRN: [id number] Date of Birth: 08/24/1955   Medicare Important Message Given:  Yes-second notification given    Delorse Lek 06/07/2015, 2:09 PM

## 2015-06-08 DIAGNOSIS — N179 Acute kidney failure, unspecified: Secondary | ICD-10-CM | POA: Insufficient documentation

## 2015-06-08 LAB — GLUCOSE, CAPILLARY
GLUCOSE-CAPILLARY: 125 mg/dL — AB (ref 65–99)
GLUCOSE-CAPILLARY: 134 mg/dL — AB (ref 65–99)
Glucose-Capillary: 255 mg/dL — ABNORMAL HIGH (ref 65–99)
Glucose-Capillary: 151 mg/dL — ABNORMAL HIGH (ref 65–99)

## 2015-06-08 LAB — BASIC METABOLIC PANEL
Anion gap: 10 (ref 5–15)
BUN: 16 mg/dL (ref 6–20)
CALCIUM: 8.9 mg/dL (ref 8.9–10.3)
CO2: 24 mmol/L (ref 22–32)
CREATININE: 1.2 mg/dL (ref 0.61–1.24)
Chloride: 102 mmol/L (ref 101–111)
GFR calc non Af Amer: >60 mL/min (ref >60)
Glucose, Bld: 130 mg/dL — ABNORMAL HIGH (ref 65–99)
Potassium: 3.9 mmol/L (ref 3.5–5.1)
SODIUM: 136 mmol/L (ref 135–145)

## 2015-06-08 LAB — CBC
HCT: 39.7 % (ref 39.0–52.0)
Hemoglobin: 13.2 g/dL (ref 13.0–17.0)
MCH: 28.2 pg (ref 26.0–34.0)
MCHC: 33.2 g/dL (ref 30.0–36.0)
MCV: 84.8 fL (ref 78.0–100.0)
PLATELETS: 143 10*3/uL — AB (ref 150–400)
RBC: 4.68 MIL/uL (ref 4.22–5.81)
RDW: 14.1 % (ref 11.5–15.5)
WBC: 13.4 10*3/uL — ABNORMAL HIGH (ref 4.0–10.5)

## 2015-06-08 LAB — PROTIME-INR
INR: 1.63 — ABNORMAL HIGH (ref 0.00–1.49)
Prothrombin Time: 19.3 seconds — ABNORMAL HIGH (ref 11.6–15.2)

## 2015-06-08 MED ORDER — SODIUM CHLORIDE 0.9 % IV BOLUS (SEPSIS)
500.0000 mL | Freq: Once | INTRAVENOUS | Status: AC
  Administered 2015-06-08: 500 mL via INTRAVENOUS

## 2015-06-08 MED ORDER — ENOXAPARIN SODIUM 40 MG/0.4ML ~~LOC~~ SOLN
40.0000 mg | Freq: Every day | SUBCUTANEOUS | Status: DC
  Administered 2015-06-08 – 2015-06-09 (×2): 40 mg via SUBCUTANEOUS
  Filled 2015-06-08 (×2): qty 0.4

## 2015-06-08 MED ORDER — SODIUM CHLORIDE 0.9 % IV SOLN
INTRAVENOUS | Status: DC
  Administered 2015-06-08: 13:00:00 via INTRAVENOUS

## 2015-06-08 MED ORDER — WARFARIN SODIUM 5 MG PO TABS
7.5000 mg | ORAL_TABLET | Freq: Once | ORAL | Status: AC
  Administered 2015-06-08: 7.5 mg via ORAL
  Filled 2015-06-08: qty 2

## 2015-06-08 NOTE — Progress Notes (Signed)
Patient ID: [name] [name], male   DOB: [birth year], 59 y.o.   MRN: [id number]     [facility]      [address] [place], [place] [address]    Phone: [phone] FAX: [phone]     Subjective: Pain is better.  Fever curve down.  WBC pending.  Passing flatus.  Tolerating clears. Still sore, but not constant pain.   Objective:  Vital signs:  Filed Vitals:   06/07/15 2147 06/08/15 0201 06/08/15 0500 06/08/15 0538  BP: 130/70 103/57  124/67  Pulse: 88 88  80  Temp: 99.9 F (37.7 C) 98.7 F (37.1 C)  98.6 F (37 C)  TempSrc: Oral     Resp: '18 18  17  ' Height:      Weight:   94.711 kg (208 lb 12.8 oz)   SpO2: 95% 94%  93%    Last BM Date: 06/03/15  Intake/Output   Yesterday:  10/25 0701 - 10/26 0700 In: 630 [P.O.:480; IV Piggyback:150] Out: 1020 [Urine:900; Drains:120] This shift: I/O last 3 completed shifts: In: 81 [P.O.:480; IV Piggyback:150] Out: 0923 [Urine:1300; Drains:120]    Physical Exam: General: Pt awake/alert/oriented x4 in no  acute distress Abdomen: Soft.  Nondistended. Tender.  RUQ d rain with serosanguinous drainage.    No evidence of peritonitis.  No incarcerated hernias.    Problem List:   Active Problems:   Cholelithiases   Cholelithiasis   Calculus of gallbladder without cholecystitis without obstruction   RUQ pain   Coronary artery disease involving native coronary artery of native heart without angina pectoris   Acute cholecystitis    Results:   Labs: Results for orders placed or performed during the hospital encounter of 06/04/15 (from the past 48 hour(s))  Glucose, capillary     Status: Abnormal   Collection Time: 06/06/15  8:02 AM  Result Value Ref Range   Glucose-Capillary 146 (H) 65 - 99 mg/dL  Protime-INR     Status: Abnormal   Collection Time: 06/06/15 11:54 AM  Result Value Ref Range   Prothrombin Time 34.0 (H) 11.6 - 15.2 seconds   INR 3.45 (H) 0.00 - 1.49  Glucose,  capillary     Status: Abnormal   Collection Time: 06/06/15 12:54 PM  Result Value Ref Range   Glucose-Capillary 165 (H) 65 - 99 mg/dL  Protime-INR     Status: Abnormal   Collection Time: 06/06/15  3:16 PM  Result Value Ref Range   Prothrombin Time 33.6 (H) 11.6 - 15.2 seconds   INR 3.40 (H) 0.00 - 1.49  CBC     Status: Abnormal   Collection Time: 06/06/15  3:16 PM  Result Value Ref Range   WBC 12.3 (H) 4.0 - 10.5 K/uL   RBC 4.88 4.22 - 5.81 MIL/uL   Hemoglobin 13.6 13.0 - 17.0 g/dL   HCT 41.2 39.0 - 52.0 %   MCV 84.4 78.0 - 100.0 fL   MCH 27.9 26.0 - 34.0 pg   MCHC 33.0 30.0 - 36.0 g/dL   RDW 13.8 11.5 - 15.5 %   Platelets 132 (L) 150 - 400 K/uL  Comprehensive metabolic panel     Status: Abnormal   Collection Time: 06/06/15  3:16 PM  Result Value Ref Range   Sodium 137 135 - 145 mmol/L   Potassium 3.8 3.5 - 5.1 mmol/L   Chloride 100 (L) 101 - 111 mmol/L   CO2 29 22 - 32 mmol/L   Glucose, Bld 140 (  H) 65 - 99 mg/dL   BUN 13 6 - 20 mg/dL   Creatinine, Ser 1.37 (H) 0.61 - 1.24 mg/dL   Calcium 8.9 8.9 - 10.3 mg/dL   Total Protein 6.9 6.5 - 8.1 g/dL   Albumin 3.1 (L) 3.5 - 5.0 g/dL   AST 16 15 - 41 U/L   ALT 13 (L) 17 - 63 U/L   Alkaline Phosphatase 47 38 - 126 U/L   Total Bilirubin 1.1 0.3 - 1.2 mg/dL   GFR calc non Af Amer 54 (L) >60 mL/min   GFR calc Af Amer >60 >60 mL/min    Comment: (NOTE) The eGFR has been calculated using the CKD EPI equation. This calculation has not been validated in all clinical situations. eGFR's persistently <60 mL/min signify possible Chronic Kidney Disease.    Anion gap 8 5 - 15  Glucose, capillary     Status: Abnormal   Collection Time: 06/06/15  5:39 PM  Result Value Ref Range   Glucose-Capillary 123 (H) 65 - 99 mg/dL  Glucose, capillary     Status: Abnormal   Collection Time: 06/06/15 10:09 PM  Result Value Ref Range   Glucose-Capillary 169 (H) 65 - 99 mg/dL  CBC     Status: Abnormal   Collection Time: 06/07/15  4:17 AM  Result  Value Ref Range   WBC 14.8 (H) 4.0 - 10.5 K/uL   RBC 4.77 4.22 - 5.81 MIL/uL   Hemoglobin 13.3 13.0 - 17.0 g/dL   HCT 40.1 39.0 - 52.0 %   MCV 84.1 78.0 - 100.0 fL   MCH 27.9 26.0 - 34.0 pg   MCHC 33.2 30.0 - 36.0 g/dL   RDW 13.9 11.5 - 15.5 %   Platelets 151 150 - 400 K/uL  Comprehensive metabolic panel     Status: Abnormal   Collection Time: 06/07/15  4:17 AM  Result Value Ref Range   Sodium 137 135 - 145 mmol/L   Potassium 3.6 3.5 - 5.1 mmol/L   Chloride 99 (L) 101 - 111 mmol/L   CO2 27 22 - 32 mmol/L   Glucose, Bld 136 (H) 65 - 99 mg/dL   BUN 12 6 - 20 mg/dL   Creatinine, Ser 1.39 (H) 0.61 - 1.24 mg/dL   Calcium 9.1 8.9 - 10.3 mg/dL   Total Protein 7.1 6.5 - 8.1 g/dL   Albumin 3.1 (L) 3.5 - 5.0 g/dL   AST 19 15 - 41 U/L   ALT 15 (L) 17 - 63 U/L   Alkaline Phosphatase 58 38 - 126 U/L   Total Bilirubin 1.3 (H) 0.3 - 1.2 mg/dL   GFR calc non Af Amer 53 (L) >60 mL/min   GFR calc Af Amer >60 >60 mL/min    Comment: (NOTE) The eGFR has been calculated using the CKD EPI equation. This calculation has not been validated in all clinical situations. eGFR's persistently <60 mL/min signify possible Chronic Kidney Disease.    Anion gap 11 5 - 15  Protime-INR     Status: Abnormal   Collection Time: 06/07/15  4:17 AM  Result Value Ref Range   Prothrombin Time 19.2 (H) 11.6 - 15.2 seconds   INR 1.61 (H) 0.00 - 1.49  Glucose, capillary     Status: Abnormal   Collection Time: 06/07/15  8:12 AM  Result Value Ref Range   Glucose-Capillary 118 (H) 65 - 99 mg/dL  Glucose, capillary     Status: Abnormal   Collection Time: 06/07/15 11:37 AM  Result  Value Ref Range   Glucose-Capillary 124 (H) 65 - 99 mg/dL  Protime-INR     Status: Abnormal   Collection Time: 06/07/15 12:23 PM  Result Value Ref Range   Prothrombin Time 18.7 (H) 11.6 - 15.2 seconds   INR 1.56 (H) 0.00 - 1.49  Gram stain     Status: None   Collection Time: 06/07/15  4:45 PM  Result Value Ref Range   Specimen  Description FLUID BILE    Special Requests NONE    Gram Stain      MODERATE WBC PRESENT,BOTH PMN AND MONONUCLEAR NO ORGANISMS SEEN    Report Status 06/07/2015 FINAL   Culture, body fluid-bottle     Status: None (Preliminary result)   Collection Time: 06/07/15  5:45 PM  Result Value Ref Range   Specimen Description FLUID BILE    Special Requests BOTTLES DRAWN AEROBIC AND ANAEROBIC 10CC    Culture PENDING    Report Status PENDING   Glucose, capillary     Status: Abnormal   Collection Time: 06/07/15  6:13 PM  Result Value Ref Range   Glucose-Capillary 127 (H) 65 - 99 mg/dL  Glucose, capillary     Status: Abnormal   Collection Time: 06/07/15 10:40 PM  Result Value Ref Range   Glucose-Capillary 138 (H) 65 - 99 mg/dL   Comment 1 Notify RN    Comment 2 Document in Chart   Protime-INR     Status: Abnormal   Collection Time: 06/08/15  4:30 AM  Result Value Ref Range   Prothrombin Time 19.3 (H) 11.6 - 15.2 seconds   INR 1.63 (H) 0.00 - 1.49    Imaging / Studies: Ir Perc Cholecystostomy  06/07/2015  CLINICAL DATA:  59 year old male with calculus cholecystitis (confirmed by nuclear medicine HIDA scan). He is currently not a surgical candidate secondary to recent placement of drug-eluting coronary stents approximately 2.5 weeks ago. He is on Plavix and cannot come off for at least 6-9 months. Additionally, he was on Coumadin therapy. After vitamin K administration, his INR is 1.56. Due to persistent fever and increasing leukocytosis despite broad-spectrum antibiotic therapy, the decision to proceed with percutaneous cholecystostomy tube was made. EXAM: CHOLECYSTOSTOMY Date: 06/07/2015 PROCEDURE: 1. Ultrasound-guided transhepatic puncture of the gallbladder 2. Placement of a 10 French percutaneous cholecystostomy tube under fluoroscopic guidance Interventional Radiologist:  Criselda Peaches, MD ANESTHESIA/SEDATION: Moderate (conscious) sedation was used. 2 mg Versed, 100 mcg Fentanyl were  administered intravenously. The patient's vital signs were monitored continuously by radiology nursing throughout the procedure. Sedation Time: 15 minutes MEDICATIONS: Zosyn 3.375 g infusing at the beginning of the procedure FLUOROSCOPY TIME:  1 minute 48 mGy CONTRAST:  59m OMNIPAQUE IOHEXOL 300 MG/ML  SOLN TECHNIQUE: Informed consent was obtained from the patient following explanation of the procedure, risks, benefits and alternatives. The patient understands, agrees and consents for the procedure. All questions were addressed. A time out was performed. Maximal barrier sterile technique utilized including caps, mask, sterile gowns, sterile gloves, large sterile drape, hand hygiene, and Betadine skin prep. The right upper quadrant was interrogated with ultrasound. The gallbladder is mildly distended. There is diffuse gallbladder wall thickening. Cholelithiasis is evident. A suitable skin entry site was selected and marked. Local anesthesia was attained by infiltration with 1% lidocaine. Under real-time sonographic guidance, a small dermatotomy was made. Under real-time sonographic guidance, a 21 gauge Accustick needle was advanced through a short transhepatic course and into the gallbladder lumen. An image was obtained and stored for the medical  record. Using standard technique, the Accustick needle was exchanged over a micro wire for the Accustick sheath. A gentle hand injection of contrast material confirmed placement within the gallbladder lumen and an obstructing stone in the gallbladder neck. A short Amplatz wire was coiled within the gallbladder lumen. The Accustick sheath was removed and a Cook 10.2 [place] all-purpose drainage catheter advanced over the wire and formed within the gallbladder lumen. Aspiration yields approximately 60 mL thick black bile. A sample was sent for Gram stain and culture. Did tube was gently flushing connected to gravity bag drainage. The tube was then secured to the skin with 0  Prolene suture and an adhesive fixation device. Overall, the patient tolerated the procedure well. COMPLICATIONS: None Estimated blood loss: 0 IMPRESSION: 1. Successful placement of a 10.2 French percutaneous transhepatic cholecystostomy tube for acute calculus cholecystitis. Signed, Criselda Peaches, MD [facility] Electronically Signed   By: Jacqulynn Cadet M.D.   On: 06/07/2015 15:52    Medications / Allergies:  Scheduled Meds: . amLODipine  10 mg Oral Daily  . atorvastatin  80 mg Oral q1800  . carvedilol  25 mg Oral BID WC  . clopidogrel  75 mg Oral Q breakfast  . folic acid  1 mg Oral Daily  . furosemide  40 mg Oral BID  . hydrALAZINE  50 mg Oral TID  . insulin aspart  0-9 Units Subcutaneous TID WC  . insulin glargine  65 Units Subcutaneous Daily  . isosorbide mononitrate  60 mg Oral Daily  . losartan  100 mg Oral Daily  . multivitamin with minerals  1 tablet Oral Daily  . pantoprazole  40 mg Oral Daily  . piperacillin-tazobactam (ZOSYN)  IV  3.375 g Intravenous 3 times per day  . potassium chloride SA  20 mEq Oral Daily  . sodium chloride  3 mL Intravenous Q12H  . spironolactone  25 mg Oral Daily  . thiamine  100 mg Oral Daily  . Warfarin - Pharmacist Dosing Inpatient   Does not apply q1800   Continuous Infusions:  PRN Meds:.sodium chloride, acetaminophen **OR** acetaminophen, HYDROmorphone (DILAUDID) injection, iohexol, nitroGLYCERIN, promethazine, sodium chloride, technetium TC 61M mebrofenin  Antibiotics: Anti-infectives    Start     Dose/Rate Route Frequency Ordered Stop   06/06/15 1000  piperacillin-tazobactam (ZOSYN) IVPB 3.375 g     3.375 g 12.5 mL/hr over 240 Minutes Intravenous 3 times per day 06/06/15 0942     06/04/15 2300  cefTRIAXone (ROCEPHIN) 2 g in dextrose 5 % 50 mL IVPB  Status:  Discontinued     2 g 100 mL/hr over 30 Minutes Intravenous Every 24 hours 06/04/15 2222 06/06/15 0942       Assessment/Plan Acute Cholecystitis with cholelithiasis-s/p cholecystostomy tube.  The drain will remain in place for 6-10 weeks.  Will follow up with him on outpatient basis.  Likely interval cholecystectomy when cleared by cardiology as he would need to be off plavix and coumadin.  Coumadin may be resumed from a surgical standpoint.  Advance diet as tolerated.  Teach drain care and flushes per IR recommendations.   ID-zosyn D#2/7.  Follow cultures. May transition to PO prior to discharge.  CV-CAD s/p DES x2 05/03/15, on plavix. CHF, PAF, HTN. Not a surgical candidate due to recent stents and need for continued to plavix Fatty liver OSA-CPAP DM FEN-may have clears post IR procedure  Dispo-per primary team.  OP follow  up with Dr. Redmond Pulling in 3-4 weeks.  Erby Pian, [facility] Pager [phone]) For consults and floor pages call [phone](7A-4:30P)  06/08/2015 7:45 AM

## 2015-06-08 NOTE — Progress Notes (Signed)
Patient declines the use of nocturnal CPAP tonight. Equipment remains at his bedside. He is aware that he may call for further assistance from RT if he should change his mind. RT will continue to follow.

## 2015-06-08 NOTE — Progress Notes (Signed)
Subjective: S/p perc chole drain for acute cholecystitis Pt feels ok, sore at drain site. On clears diet  Allergies: Other and Ace inhibitors  Medications:  Current facility-administered medications:  .  0.9 %  sodium chloride infusion, 250 mL, Intravenous, PRN, Aquilla Hacker, MD .  acetaminophen (TYLENOL) tablet 650 mg, 650 mg, Oral, Q6H PRN, 650 mg at 06/06/15 0904 **OR** acetaminophen (TYLENOL) suppository 650 mg, 650 mg, Rectal, Q6H PRN, York Ram Melancon, MD .  amLODipine (NORVASC) tablet 10 mg, 10 mg, Oral, Daily, Aquilla Hacker, MD, 10 mg at 06/07/15 0753 .  atorvastatin (LIPITOR) tablet 80 mg, 80 mg, Oral, q1800, Aquilla Hacker, MD, 80 mg at 06/07/15 1820 .  carvedilol (COREG) tablet 25 mg, 25 mg, Oral, BID WC, Aquilla Hacker, MD, 25 mg at 06/08/15 0823 .  clopidogrel (PLAVIX) tablet 75 mg, 75 mg, Oral, Q breakfast, Aquilla Hacker, MD, 75 mg at 06/08/15 0823 .  folic acid (FOLVITE) tablet 1 mg, 1 mg, Oral, Daily, Aquilla Hacker, MD, 1 mg at 06/07/15 0752 .  furosemide (LASIX) tablet 40 mg, 40 mg, Oral, BID, Aquilla Hacker, MD, 40 mg at 06/08/15 9833 .  hydrALAZINE (APRESOLINE) tablet 50 mg, 50 mg, Oral, TID, Aquilla Hacker, MD, 50 mg at 06/07/15 2300 .  HYDROmorphone (DILAUDID) injection 1 mg, 1 mg, Intravenous, Q2H PRN, Emina Riebock, NP, 1 mg at 06/07/15 1407 .  insulin aspart (novoLOG) injection 0-9 Units, 0-9 Units, Subcutaneous, TID WC, Aquilla Hacker, MD, 1 Units at 06/08/15 0844 .  insulin glargine (LANTUS) injection 65 Units, 65 Units, Subcutaneous, Daily, Aquilla Hacker, MD, Stopped at 06/07/15 1000 .  iohexol (OMNIPAQUE) 300 MG/ML solution 50 mL, 50 mL, Intravenous, Once PRN, Jacqulynn Cadet, MD, 10 mL at 06/07/15 1523 .  isosorbide mononitrate (IMDUR) 24 hr tablet 60 mg, 60 mg, Oral, Daily, Aquilla Hacker, MD, 60 mg at 06/07/15 0753 .  losartan (COZAAR) tablet 100 mg, 100 mg, Oral, Daily, Aquilla Hacker, MD, 100 mg at 06/07/15 0753 .   multivitamin with minerals tablet 1 tablet, 1 tablet, Oral, Daily, Aquilla Hacker, MD, 1 tablet at 06/07/15 0753 .  nitroGLYCERIN (NITROSTAT) SL tablet 0.4 mg, 0.4 mg, Sublingual, Q5 min PRN, Hoyle Sauer, MD, 0.4 mg at 06/04/15 1542 .  pantoprazole (PROTONIX) EC tablet 40 mg, 40 mg, Oral, Daily, Aquilla Hacker, MD, 40 mg at 06/07/15 0753 .  piperacillin-tazobactam (ZOSYN) IVPB 3.375 g, 3.375 g, Intravenous, 3 times per day, Emina Riebock, NP, 3.375 g at 06/08/15 0556 .  potassium chloride SA (K-DUR,KLOR-CON) CR tablet 20 mEq, 20 mEq, Oral, Daily, Aquilla Hacker, MD, 20 mEq at 06/07/15 0752 .  promethazine (PHENERGAN) tablet 12.5 mg, 12.5 mg, Oral, Q6H PRN, Caleb G Melancon, MD .  sodium chloride 0.9 % injection 3 mL, 3 mL, Intravenous, Q12H, York Ram Melancon, MD, 3 mL at 06/06/15 1038 .  sodium chloride 0.9 % injection 3 mL, 3 mL, Intravenous, PRN, York Ram Melancon, MD .  spironolactone (ALDACTONE) tablet 25 mg, 25 mg, Oral, Daily, Aquilla Hacker, MD, 25 mg at 06/07/15 0753 .  technetium TC 57M mebrofenin (CHOLETEC) injection 5 milli Curie, 5 milli Curie, Intravenous, Once PRN, Medication Radiologist, MD, [place] at 06/05/15 [phone] .  thiamine (VITAMIN B-1) tablet 100 mg, 100 mg, Oral, Daily, 100 mg at 06/07/15 0752 **OR** [DISCONTINUED] thiamine (B-1) injection 100 mg, 100 mg, Intravenous, Daily, Aquilla Hacker, MD .  Warfarin - Pharmacist  Dosing Inpatient, , Does not apply, q1800, Reginia Naas, RPH, 0  at 06/07/15 1743    Vital Signs: BP 124/67 mmHg  Pulse 80  Temp(Src) 98.6 F (37 C) (Oral)  Resp 17  Ht 6' (1.829 m)  Wt 208 lb 12.8 oz (94.711 kg)  BMI 28.31 kg/m2  SpO2 93%  Physical Exam  Abdominal:  Soft, Tender RUQ and at drain site Drain intact, site clean, no leakage Output mix of blood and thin pale bile/serous fluid    Imaging:  Ir Perc Cholecystostomy  06/07/2015  CLINICAL DATA:  59 year old male with calculus cholecystitis (confirmed by nuclear  medicine HIDA scan). He is currently not a surgical candidate secondary to recent placement of drug-eluting coronary stents approximately 2.5 weeks ago. He is on Plavix and cannot come off for at least 6-9 months. Additionally, he was on Coumadin therapy. After vitamin K administration, his INR is 1.56. Due to persistent fever and increasing leukocytosis despite broad-spectrum antibiotic therapy, the decision to proceed with percutaneous cholecystostomy tube was made. EXAM: CHOLECYSTOSTOMY Date: 06/07/2015 PROCEDURE: 1. Ultrasound-guided transhepatic puncture of the gallbladder 2. Placement of a 10 French percutaneous cholecystostomy tube under fluoroscopic guidance Interventional Radiologist:  Criselda Peaches, MD ANESTHESIA/SEDATION: Moderate (conscious) sedation was used. 2 mg Versed, 100 mcg Fentanyl were administered intravenously. The patient's vital signs were monitored continuously by radiology nursing throughout the procedure. Sedation Time: 15 minutes MEDICATIONS: Zosyn 3.375 g infusing at the beginning of the procedure FLUOROSCOPY TIME:  1 minute 48 mGy CONTRAST:  54mL OMNIPAQUE IOHEXOL 300 MG/ML  SOLN TECHNIQUE: Informed consent was obtained from the patient following explanation of the procedure, risks, benefits and alternatives. The patient understands, agrees and consents for the procedure. All questions were addressed. A time out was performed. Maximal barrier sterile technique utilized including caps, mask, sterile gowns, sterile gloves, large sterile drape, hand hygiene, and Betadine skin prep. The right upper quadrant was interrogated with ultrasound. The gallbladder is mildly distended. There is diffuse gallbladder wall thickening. Cholelithiasis is evident. A suitable skin entry site was selected and marked. Local anesthesia was attained by infiltration with 1% lidocaine. Under real-time sonographic guidance, a small dermatotomy was made. Under real-time sonographic guidance, a 21 gauge  Accustick needle was advanced through a short transhepatic course and into the gallbladder lumen. An image was obtained and stored for the medical record. Using standard technique, the Accustick needle was exchanged over a micro wire for the Accustick sheath. A gentle hand injection of contrast material confirmed placement within the gallbladder lumen and an obstructing stone in the gallbladder neck. A short Amplatz wire was coiled within the gallbladder lumen. The Accustick sheath was removed and a Cook 10.2 [place] all-purpose drainage catheter advanced over the wire and formed within the gallbladder lumen. Aspiration yields approximately 60 mL thick black bile. A sample was sent for Gram stain and culture. Did tube was gently flushing connected to gravity bag drainage. The tube was then secured to the skin with 0 Prolene suture and an adhesive fixation device. Overall, the patient tolerated the procedure well. COMPLICATIONS: None Estimated blood loss: 0 IMPRESSION: 1. Successful placement of a 10.2 French percutaneous transhepatic cholecystostomy tube for acute calculus cholecystitis. Signed, Criselda Peaches, MD [facility] Electronically Signed   By: Jacqulynn Cadet M.D.   On: 06/07/2015 15:52    Labs:  CBC:  Recent Labs  06/05/15 0316 06/06/15 1516 06/07/15 0417 06/08/15 0430  WBC 13.8* 12.3* 14.8* 13.4*  HGB  14.1 13.6 13.3 13.2  HCT 41.8 41.2 40.1 39.7  PLT 137* 132* 151 143*    COAGS:  Recent Labs  06/06/15 1516 06/07/15 0417 06/07/15 1223 06/08/15 0430  INR 3.40* 1.61* 1.56* 1.63*    BMP:  Recent Labs  06/04/15 1438 06/05/15 0316 06/06/15 1516 06/07/15 0417  NA 134* 133* 137 137  K 3.5 3.6 3.8 3.6  CL 100* 100* 100* 99*  CO2 26 25 29 27   GLUCOSE 174* 168* 140* 136*  BUN 8 8 13 12   CALCIUM 9.3 8.7* 8.9 9.1  CREATININE 1.04 1.00 1.37* 1.39*  GFRNONAA >60 >60 54* 53*  GFRAA >60 >60 >60 >60    LIVER  FUNCTION TESTS:  Recent Labs  06/04/15 1612 06/06/15 1516 06/07/15 0417  BILITOT 0.5 1.1 1.3*  AST 19 16 19   ALT 14* 13* 15*  ALKPHOS 48 47 58  PROT 7.3 6.9 7.1  ALBUMIN 3.8 3.1* 3.1*    Assessment and Plan: Acute cholecystitis S/p per chole drain Expected bloody output as pt on Plavix and INR still slightly elevated at time of procedure Will follow.  SignedAscencion Dike 06/08/2015, 9:12 AM   I spent a total of 15 Minutes at the the patient's bedside AND on the patient's hospital floor or unit, greater than 50% of which was counseling/coordinating care for perc chole drain placement

## 2015-06-08 NOTE — Progress Notes (Signed)
Family Medicine Teaching Service Daily Progress Note Intern Pager: [phone]  Patient name: [name] [name] Medical record number: [id number] Date of birth: 10-06-1955 Age: 59 y.o. Gender: male  Primary Care Provider: Angelica Chessman, MD Consultants: surgery, IR Code Status: Full   Assessment and Plan: Tracker Mance is a 59 y.o. male presenting with Symptomatic Cholelithiasis. PMH is significant for CAD s/p DES x 2 in the RCA (9/16), CHF rEF mixed etiology though most recent Echo EF 55-60%, DMII, HTN, OSA (Needs CPAP device at home), Paroxysmal A Fib (hx of A flutter s/p ablation '07/'08), Chronic Anticoagulation, HLD, Gout, GERD, ? Chronic Hep B.   Acute Cholecysitis with Cholelithiasis - Had Perc drain placed for cholecystitis. Pain relieved with procedure. A little bloody output. - Surgery and IR consulted, appreciate their assistance.  - Current Recs: 1. ADAT. 2. IV abx with PO at d/c for 1 more week (will discuss with them what their preference is for PO) 3. F/U with sg 3-4 weeks. 4. Resume anticoagulation. Possible interval cholecystectomy at some point in the future.  - Empiric Ceftriaxone (10/22-10/23), Zosyn started per surgery (10/24 -  ) - Restarted coumadin.  - Bridge with lovenox given 5mg  Vit. K yesterday. INR 1.6 this am. May come back down.   - Pain control with PRN Dilaudid 1 mg q2 - Phenergan for nausea   AKI - Mild. Uptrending creatinine.  - Holding ARB, nephrotoxic meds - Taking PO now.  - Will give small amount of fluids. Fairly good EF on last Echo   CAD s/p DES x 2 in the RCA - Stable here. Continue medical management.  - Continue Plavix.  - Holding ASa - Coumadin restarted. Bridging with lovenox.  - Cardiology consulted during hospitalization:  Dr. Martin[place] said if patient were to have surgery, could continue Plavix, just stop Coumadin.   CHFrEF : Mixed etiology ETOH? / Ischemic. Last EF noted 55-60% with heart cath. Managed medically as an outpatient.  Euvolemic.  - Hold Lasix for now - Will monitor fluid status closely.  - I/O's - Daily weights.   Paroxysmal Afib (Hx of A-Flutter s/p Ablation 0539,7673); Hx of RVR at one time as well. Normal sinus rhythm here. On coumadin.  - Continue medical management.  - resuming coumadin. Bridge with lovenox.   - Monitor PT/INR.   DMII - A1C 8 at last check.  - Resume Lantus 65 u q am once taking regular diet.  - Moderate SSI.  - CBG monitoring.   HTN - BP elevated here. - Continue Coreg, Hydralazine, Imdur, Spironolactone. KDUR 20 meq given daily.  - Holding lasix and Losartan given AKI.   OSA - symptoms include waking up at night gasping for breath, but no particular orthopnea. He says that he is waiting for his machine to be delivered to his house.  - CPAP here. Autotitrate.   GERD- continue protonix.   HLD- continue lipitor  Hx of ETOH Abuse - says that he has been cutting back on his drinking - says he drinks 1-2 drinks every other day now. Used to drink much more and says he had an ETOH abuse problem. Also MJ, but otherwise no other substances.  - CIWA protocol   Chronic Hep B - listed in chart. No labs to support this. Pt. Without knowledge of this diagnosis. Hep Panel negative - Hep panel negative. Hep B sag, Hep B cIgM.   FEN/GI: ADAT, Protonix Prophylaxis: Lovenox, Coumadin   Disposition: Pending improvement in AKI, transition to po antibiotics.  Subjective:  NAEON. Pt. With improvement in his RUQ pain after procedure. Some tenderness post-procedure, but otherwise better. Some bloody drainage from perc tube, but not more than expected. No fevers. Using his IS.  No nausea / vomiting.   Objective: Temp:  [98.6 F (37 C)-99.9 F (37.7 C)] 98.8 F (37.1 C) (10/26 0950) Pulse Rate:  [80-91] 83 (10/26 0950) Resp:  [15-24] 18 (10/26 0950) BP: (103-153)/(57-86) 135/78 mmHg (10/26 0950) SpO2:  [93 %-96 %] 95 % (10/26 0950) Weight:  [208 lb 12.8 oz (94.711 kg)] 208  lb 12.8 oz (94.711 kg) (10/26 0500) Physical Exam: General: NAD, AAOx3, comfortable.  Cardiovascular: RRR, no MGR, normal S1/S2 Respiratory: CTA bilaterally, no increased work of breathing Abdomen:soft, Mild distension, some slight tenderness around the perc drain site, dressing c/d/i, some bloody drainage in drain approx. 30cc.  Extremities: no edema, no TTP.   Laboratory:  Recent Labs Lab 06/06/15 1516 06/07/15 0417 06/08/15 0430  WBC 12.3* 14.8* 13.4*  HGB 13.6 13.3 13.2  HCT 41.2 40.1 39.7  PLT 132* 151 143*    Recent Labs Lab 06/04/15 1612 06/05/15 0316 06/06/15 1516 06/07/15 0417  NA  --  133* 137 137  K  --  3.6 3.8 3.6  CL  --  100* 100* 99*  CO2  --  25 29 27   BUN  --  8 13 12   CREATININE  --  1.00 1.37* 1.39*  CALCIUM  --  8.7* 8.9 9.1  PROT 7.3  --  6.9 7.1  BILITOT 0.5  --  1.1 1.3*  ALKPHOS 48  --  47 58  ALT 14*  --  13* 15*  AST 19  --  16 19  GLUCOSE  --  168* 140* 136*    Troponin (Point of Care Test) No results for input(s): TROPIPOC in the last 72 hours.  Imaging/Diagnostic Tests: Ir Perc Cholecystostomy  06/07/2015  CLINICAL DATA:  59 year old male with calculus cholecystitis (confirmed by nuclear medicine HIDA scan). He is currently not a surgical candidate secondary to recent placement of drug-eluting coronary stents approximately 2.5 weeks ago. He is on Plavix and cannot come off for at least 6-9 months. Additionally, he was on Coumadin therapy. After vitamin K administration, his INR is 1.56. Due to persistent fever and increasing leukocytosis despite broad-spectrum antibiotic therapy, the decision to proceed with percutaneous cholecystostomy tube was made. EXAM: CHOLECYSTOSTOMY Date: 06/07/2015 PROCEDURE: 1. Ultrasound-guided transhepatic puncture of the gallbladder 2. Placement of a 10 French percutaneous cholecystostomy tube under fluoroscopic guidance Interventional Radiologist:  Criselda Peaches, MD ANESTHESIA/SEDATION: Moderate  (conscious) sedation was used. 2 mg Versed, 100 mcg Fentanyl were administered intravenously. The patient's vital signs were monitored continuously by radiology nursing throughout the procedure. Sedation Time: 15 minutes MEDICATIONS: Zosyn 3.375 g infusing at the beginning of the procedure FLUOROSCOPY TIME:  1 minute 48 mGy CONTRAST:  51mL OMNIPAQUE IOHEXOL 300 MG/ML  SOLN TECHNIQUE: Informed consent was obtained from the patient following explanation of the procedure, risks, benefits and alternatives. The patient understands, agrees and consents for the procedure. All questions were addressed. A time out was performed. Maximal barrier sterile technique utilized including caps, mask, sterile gowns, sterile gloves, large sterile drape, hand hygiene, and Betadine skin prep. The right upper quadrant was interrogated with ultrasound. The gallbladder is mildly distended. There is diffuse gallbladder wall thickening. Cholelithiasis is evident. A suitable skin entry site was selected and marked. Local anesthesia was attained by infiltration with 1% lidocaine. Under real-time sonographic guidance, a  small dermatotomy was made. Under real-time sonographic guidance, a 21 gauge Accustick needle was advanced through a short transhepatic course and into the gallbladder lumen. An image was obtained and stored for the medical record. Using standard technique, the Accustick needle was exchanged over a micro wire for the Accustick sheath. A gentle hand injection of contrast material confirmed placement within the gallbladder lumen and an obstructing stone in the gallbladder neck. A short Amplatz wire was coiled within the gallbladder lumen. The Accustick sheath was removed and a Cook 10.2 [place] all-purpose drainage catheter advanced over the wire and formed within the gallbladder lumen. Aspiration yields approximately 60 mL thick black bile. A sample was sent for Gram stain and culture. Did tube was gently flushing connected to  gravity bag drainage. The tube was then secured to the skin with 0 Prolene suture and an adhesive fixation device. Overall, the patient tolerated the procedure well. COMPLICATIONS: None Estimated blood loss: 0 IMPRESSION: 1. Successful placement of a 10.2 French percutaneous transhepatic cholecystostomy tube for acute calculus cholecystitis. Signed, Criselda Peaches, MD [facility] Radiology Electronically Signed   By: Jacqulynn Cadet M.D.   On: 06/07/2015 15:52     Aquilla Hacker, MD 06/08/2015, 10:10 AM PGY-2, [place] Intern pager: [phone], text pages welcome

## 2015-06-08 NOTE — Progress Notes (Signed)
[place] for Coumadin and LMWH Indication: Afib and bridge tx  Allergies  Allergen Reactions  . Other Hives    Patient reports developing hives after receiving "some antibiotic given in 1980''s at [facility]". He does not know which antibiotic.  [name] Ace Inhibitors Cough    Patient Measurements: Height: 6' (182.9 cm) Weight: 208 lb 12.8 oz (94.711 kg) IBW/kg (Calculated) : 77.6  Vital Signs: Temp: 98.8 F (37.1 C) (10/26 0950) Temp Source: Oral (10/26 0950) BP: 135/78 mmHg (10/26 0950) Pulse Rate: 83 (10/26 0950)  Labs:  Recent Labs  06/05/15 1430  06/06/15 1516 06/07/15 0417 06/07/15 1223 06/08/15 0430  HGB  --   < > 13.6 13.3  --  13.2  HCT  --   --  41.2 40.1  --  39.7  PLT  --   --  132* 151  --  143*  LABPROT  --   < > 33.6* 19.2* 18.7* 19.3*  INR  --   < > 3.40* 1.61* 1.56* 1.63*  CREATININE  --   --  1.37* 1.39*  --   --   TROPONINI 0.03  --   --   --   --   --   < > = values in this interval not displayed.  Estimated Creatinine Clearance: 66.6 mL/min (by C-G formula based on Cr of 1.39).    Assessment: 59 yo M presents on 10/22 with cholelithiasis. Also had elevated INR on admission of 4.49. Last dose was 10/22. Coumadin was held pre-op and resumed 10/25 after cholecystostomy tube placed. INR today is subtherapeutic at 1.63. CBC stable, no s/s of bleed. PLTC 143 - stable.   10/24 Vitamin K 1 mg IV given at 1342 10/25: INR 1.61 and 1.56( at 1223, given 5 mg vitamin K sq given 1019am 10/25  PTA Coumadin is 5mg  daily exc 2.5mg  on Wed.  Goal of Therapy:  INR 2-3 Monitor platelets by anticoagulation protocol: Yes   Plan:  Give coumadin 7.5mg  PO x 1 tonight LMWH 40 mg qday for bridge tx until INR > 2 Monitor daily INR, CBC, s/s of bleed  Eudelia Bunch, Pharm.D. [phone] 06/08/2015 10:12 AM

## 2015-06-09 LAB — CBC
HEMATOCRIT: 39.6 % (ref 39.0–52.0)
Hemoglobin: 13 g/dL (ref 13.0–17.0)
MCH: 27.7 pg (ref 26.0–34.0)
MCHC: 32.8 g/dL (ref 30.0–36.0)
MCV: 84.4 fL (ref 78.0–100.0)
Platelets: 168 10*3/uL (ref 150–400)
RBC: 4.69 MIL/uL (ref 4.22–5.81)
RDW: 13.9 % (ref 11.5–15.5)
WBC: 9.1 10*3/uL (ref 4.0–10.5)

## 2015-06-09 LAB — BASIC METABOLIC PANEL
ANION GAP: 8 (ref 5–15)
BUN: 19 mg/dL (ref 6–20)
CALCIUM: 8.6 mg/dL — AB (ref 8.9–10.3)
CO2: 26 mmol/L (ref 22–32)
Chloride: 101 mmol/L (ref 101–111)
Creatinine, Ser: 1.35 mg/dL — ABNORMAL HIGH (ref 0.61–1.24)
GFR calc Af Amer: >60 mL/min (ref >60)
GFR, EST NON AFRICAN AMERICAN: 55 mL/min — AB (ref >60)
GLUCOSE: 152 mg/dL — AB (ref 65–99)
Potassium: 3.8 mmol/L (ref 3.5–5.1)
Sodium: 135 mmol/L (ref 135–145)

## 2015-06-09 LAB — GLUCOSE, CAPILLARY
GLUCOSE-CAPILLARY: 116 mg/dL — AB (ref 65–99)
Glucose-Capillary: 191 mg/dL — ABNORMAL HIGH (ref 65–99)
Glucose-Capillary: 291 mg/dL — ABNORMAL HIGH (ref 65–99)

## 2015-06-09 LAB — PROTIME-INR
INR: 1.48 (ref 0.00–1.49)
Prothrombin Time: 18 seconds — ABNORMAL HIGH (ref 11.6–15.2)

## 2015-06-09 MED ORDER — CEFDINIR 300 MG PO CAPS: 300.0000 mg | ORAL_CAPSULE | Freq: Two times a day (BID) | ORAL | Status: DC

## 2015-06-09 MED ORDER — METRONIDAZOLE 500 MG PO TABS: 500.0000 mg | ORAL_TABLET | Freq: Two times a day (BID) | ORAL | Status: DC

## 2015-06-09 MED ORDER — ENOXAPARIN SODIUM 150 MG/ML ~~LOC~~ SOLN: 150.0000 mg | Freq: Every day | SUBCUTANEOUS | Status: DC

## 2015-06-09 MED ORDER — WARFARIN SODIUM 5 MG PO TABS
7.5000 mg | ORAL_TABLET | Freq: Every day | ORAL | Status: DC
  Administered 2015-06-09: 7.5 mg via ORAL
  Filled 2015-06-09: qty 2

## 2015-06-09 NOTE — Progress Notes (Signed)
Patient to d/c home per MD order. Received AVS with all questions answered appropriately. Patient verbalizes understanding. IV removed. Teaching regarding lovenox administration and drain care completed with patient able to return demonstrate.  Received prescriptions.  Patient escorted via wheelchair and d/c home with family.

## 2015-06-09 NOTE — Progress Notes (Signed)
[place] for Coumadin and LMWH Indication: Afib and bridge tx  Allergies  Allergen Reactions  . Other Hives    Patient reports developing hives after receiving "some antibiotic given in 1980''s at [facility]". He does not know which antibiotic.  [name] Ace Inhibitors Cough    Patient Measurements: Height: 6' (182.9 cm) Weight: 213 lb 3 oz (96.7 kg) IBW/kg (Calculated) : 77.6  Vital Signs: Temp: 99.3 F (37.4 C) (10/27 0531) BP: 124/72 mmHg (10/27 1058) Pulse Rate: 90 (10/27 0823)  Labs:  Recent Labs  06/07/15 0417 06/07/15 1223 06/08/15 0430 06/08/15 1009 06/09/15 0358  HGB 13.3  --  13.2  --  13.0  HCT 40.1  --  39.7  --  39.6  PLT 151  --  143*  --  168  LABPROT 19.2* 18.7* 19.3*  --  18.0*  INR 1.61* 1.56* 1.63*  --  1.48  CREATININE 1.39*  --   --  1.20 1.35*    Estimated Creatinine Clearance: 69.2 mL/min (by C-G formula based on Cr of 1.35).    Assessment: 59yo M on warfarin PTA  s/p cholecystostomy tube, pt on plavix 2nd DES x2 05/03/15.   PTA Coumadin is 5mg  daily exc 2.5mg  on Wed.   10/24 Vitamin K 1 mg IV given at 1342 10/25: INR 1.61 and 1.56( at 1223, given 5 mg vitamin K sq given 1019am 10/25  10/26 rx consulted to bridge w/ LMWH for afib  s/p perc drain yest w/ output mix of blood and bile/serous fluid " expected bloody output as pt on plavix and INR sl elevated at time of procedure.   10/27: INR down to 1.48 probably from vitamin k; bloody bilious drainage in bag; CBC stable; OK to DC from surgical standpoint  Goal of Therapy:  INR 2-3 Monitor platelets by anticoagulation protocol: Yes   Plan:  - DC home w/ LMWH 40 x 4 more days - co-pay $ 2.95 at [facility] - Has 5 mg coumadin tablets at home - coumadin 7.5 mg daily( = 1.5 tabs of 5 mg) - recheck INR Fri/Sat as outpt - LMWH 40 qday for bridge tx until INR > 2  Eudelia Bunch, Pharm.D. [phone] 06/09/2015 11:37 AM

## 2015-06-09 NOTE — Discharge Summary (Signed)
[name] [name] Discharge Summary  Patient name: [name] [name] Medical record number: [id number] Date of birth: 02-11-1956 Age: 59 y.o. Gender: male Date of Admission: 06/04/2015  Date of Discharge: 06/09/2015 Admitting Physician: Alveda Reasons, MD  Primary Care Provider: Angelica Chessman, MD Consultants: cardiology, surgery, interventional radiology, cardiology  Indication for Hospitalization: acute cholecystitis  Discharge Diagnoses/Problem List:  Patient Active Problem List   Diagnosis Date Noted  . Acute kidney injury ([place])   . Acute cholecystitis   . Calculus of gallbladder without cholecystitis without obstruction   . RUQ pain   . Coronary artery disease involving native coronary artery of native heart without angina pectoris   . Cholelithiases 06/04/2015  . Cholelithiasis 06/04/2015  . Stented coronary artery   . Essential hypertension   . Abnormal nuclear stress test 05/03/2015  . Chronic diastolic CHF (congestive heart failure) ([place]) 04/24/2015  . Excessive daytime sleepiness 11/25/2014  . OSA (obstructive sleep apnea) 11/25/2014  . NICM (nonischemic cardiomyopathy) ([place]) 03/11/2014  . Anticoagulation goal of INR 2 to 3 10/19/2013  . Eczema 10/19/2013  . Low libido 10/16/2013  . Paroxysmal atrial fibrillation ([place]) 10/06/2013  . Encounter for therapeutic drug monitoring 09/10/2013  . HTN (hypertension) 07/31/2013  . Long term (current) use of anticoagulants 04/16/2012  . Coronary artery disease 12/28/2011  . Chronic combined systolic and diastolic CHF (congestive heart failure) ([place]) 12/18/2011  . Rectal bleeding 12/18/2011  . CANDIDIASIS 09/27/2010  . HYPERCHOLESTEROLEMIA 07/11/2010  . DENTAL CARIES 06/30/2010  . GOUT 05/05/2009  . Diabetes ([place]) 03/06/2009  . HEPATITIS B, CHRONIC 12/30/2006  . OSTEOCHONDROMA 12/30/2006  . Mitral valve disorder 12/30/2006  . LIVER FUNCTION TESTS, ABNORMAL, HX OF 12/30/2006  . COLONIC POLYPS, HX  OF 12/30/2006  . COLONOSCOPY, HX OF 05/12/2001    Disposition: Home  Discharge Condition: Stable  Discharge Exam: see previous progress note  Brief [name] Course:  [name] [name] is a 59 y.o. male presenting with Symptomatic Cholelithiasis. PMH is significant for CAD s/p DES x 2 in the RCA (9/16), CHF rEF mixed etiology though most recent Echo EF 55-60%, DMII, HTN, OSA (Needs CPAP device at home), Paroxysmal A Fib (hx of A flutter s/p ablation '07/'08), Chronic Anticoagulation, HLD, Gout, GERD, ? Chronic Hep B  Cholecystitis: Pt presented with RUQ pain. Had elevated WBC of 6.7, Bili - 0.1, LFT's otherwise normal, Lipase 30. PT/INR elevated secondary to coumadin. RUQ U/S with 25mm stone in the neck of the gallbladder otherwise no evidence of cholecystitis. TTP in the RUQ on exam. Pain requiring Dilaudid pain medicine for control. Surgery was consulted and empiric Ceftriaxone was given (10/22-10/23). Patient received HIDA scan which revealed cholecystitis. Zosyn was given (10/24- 10/27). Patient could not have cholecystectomy due to elevated INR of 4.4. Patient was given Vit K to lower INR. Interventional radiology was consulted and after INR was lowered to 1.6, they were able to place percutaneous  cholecystostomy tube on 10/26. Pain was relieved with procedure. Anticoagulation with Coumadin was resumed and patient was bridged with Lovenox.  Upon discharge, patient's tube was draining appropriately and his pain improved significantly. He was tolerated a normal diet. Was sent home with PO Cefdinir and Flagyl for 1 week course.   AKI - Patient had mild AKI with uptrending creatinine. ARB and Lasix were held. Small amount of fluids were given.   Cardiac: CAD s/p DES x 2 in the RCA, Paroxysmal a-fib, and HTN- Stable while in the hosptial. Patient's  Plavix was continued throughout hospitalization. ASA  was stopped. Coumadin was stopped prior to surgery but was resumed after. HTN - BP elevated at times.  Lasix, losartan, spironolactone all currently on hold given acute kidney injury in the setting of cholecystitisWill need to follow up with cardiology  Hx of ETOH Abuse -  CIWA protocol was given. Patient consistently had CIWA scores of zero.   Questionable Chronic Hep B - listed in chart. No labs to support this. Pt. Without knowledge of this diagnosis. Hep panel negative. Hep B sag, Hep B cIgM.    All other chronic medical conditions stable throughout admission and managed with home regimens.   Issues for Follow Up:  1. Anticoagulation: Patient now on Coumadin and will need INR checked 2. S/p percutaneous Cholecystostomy tube placement. Will need surgery follow up.  3. Will need repeat BMP to ensure resolution of AKI. Lasix, losartan, spironolactone all currently on hold given acute kidney injury in the setting of cholecystitis  Significant Procedures: Percutanous cholecystostomy tube placement on 10/26  Significant Labs and Imaging:   Recent Labs Lab 06/07/15 0417 06/08/15 0430 06/09/15 0358  WBC 14.8* 13.4* 9.1  HGB 13.3 13.2 13.0  HCT 40.1 39.7 39.6  PLT 151 143* 168    Recent Labs Lab 06/04/15 1612 06/04/15 2201 06/05/15 0316 06/06/15 1516 06/07/15 0417 06/08/15 1009 06/09/15 0358  NA  --   --  133* 137 137 136 135  K  --   --  3.6 3.8 3.6 3.9 3.8  CL  --   --  100* 100* 99* 102 101  CO2  --   --  25 29 27 24 26   GLUCOSE  --   --  168* 140* 136* 130* 152*  BUN  --   --  8 13 12 16 19   CREATININE  --   --  1.00 1.37* 1.39* 1.20 1.35*  CALCIUM  --   --  8.7* 8.9 9.1 8.9 8.6*  MG  --  1.6*  --   --   --   --   --   ALKPHOS 48  --   --  47 58  --   --   AST 19  --   --  16 19  --   --   ALT 14*  --   --  13* 15*  --   --   ALBUMIN 3.8  --   --  3.1* 3.1*  --   --     No results found.   Results/Tests Pending at Time of Discharge: none  Discharge Medications:    Medication List    STOP taking these medications        amLODipine 10 MG tablet  Commonly  known as:  NORVASC     aspirin 81 MG chewable tablet     carvedilol 25 MG tablet  Commonly known as:  COREG     furosemide 40 MG tablet  Commonly known as:  LASIX     hydrALAZINE 50 MG tablet  Commonly known as:  APRESOLINE     HYDROcodone-acetaminophen 5-325 MG tablet  Commonly known as:  NORCO/VICODIN     insulin glargine 100 UNIT/ML injection  Commonly known as:  LANTUS     isosorbide mononitrate 60 MG 24 hr tablet  Commonly known as:  IMDUR     losartan 100 MG tablet  Commonly known as:  COZAAR     nitroGLYCERIN 0.4 MG SL tablet  Commonly known as:  NITROSTAT     pantoprazole 40 MG  tablet  Commonly known as:  PROTONIX     potassium chloride SA 20 MEQ tablet  Commonly known as:  KLOR-CON M20     spironolactone 25 MG tablet  Commonly known as:  ALDACTONE      TAKE these medications        acetaminophen 500 MG tablet  Commonly known as:  TYLENOL  Take 1,000 mg by mouth every 6 (six) hours as needed (pain).     atorvastatin 80 MG tablet  Commonly known as:  LIPITOR  Take 1 tablet (80 mg total) by mouth daily.     bismuth subsalicylate 671 IW/58KD suspension  Commonly known as:  PEPTO BISMOL  Take 30 mLs by mouth every 6 (six) hours as needed for indigestion (stomach pain).     cefdinir 300 MG capsule  Commonly known as:  OMNICEF  Take 1 capsule (300 mg total) by mouth 2 (two) times daily.     clopidogrel 75 MG tablet  Commonly known as:  PLAVIX  Take 1 tablet (75 mg total) by mouth daily with breakfast.     enoxaparin 150 MG/ML injection  Commonly known as:  LOVENOX  Inject 1 mL (150 mg total) into the skin daily.     metFORMIN 1000 MG tablet  Commonly known as:  GLUCOPHAGE  Take 1 tablet (1,000 mg total) by mouth 2 (two) times daily with a meal.     metroNIDAZOLE 500 MG tablet  Commonly known as:  FLAGYL  Take 1 tablet (500 mg total) by mouth 2 (two) times daily.     warfarin 5 MG tablet  Commonly known as:  COUMADIN  Take 1 tablet (5 mg  total) by mouth as directed.        Discharge Instructions: Please refer to Patient Instructions section of EMR for full details.  Patient was counseled important signs and symptoms that should prompt return to medical care, changes in medications, dietary instructions, activity restrictions, and follow up appointments.   Follow-Up Appointments:     Follow-up Information    Follow up with Gayland Curry, MD In 3 weeks.   Specialty:  General Surgery   Why:  for gallbladder drain with your surgeon.    Contact information:   [address] [address] [phone]       Follow up with Angelica Chessman, MD. Schedule an appointment as soon as possible for a visit in 3 days.   Specialty:  Internal Medicine   Why:  [name] follow-up   Contact information:   [place] [address] [phone]       Carlyle Dolly, MD 06/09/2015, 2:55 PM PGY-1, [place]

## 2015-06-09 NOTE — Progress Notes (Signed)
Subjective: Patient feeling better - advanced to regular diet Still sore at drain site + BM   Objective: Vital signs in last 24 hours: Temp:  [98.4 F (36.9 C)-99.4 F (37.4 C)] 99.3 F (37.4 C) (10/27 0531) Pulse Rate:  [76-89] 89 (10/27 0531) Resp:  [17-20] 17 (10/27 0531) BP: (112-135)/(49-78) 128/71 mmHg (10/27 0531) SpO2:  [93 %-95 %] 95 % (10/27 0531) Weight:  [96.7 kg (213 lb 3 oz)] 96.7 kg (213 lb 3 oz) (10/27 0500) Last BM Date: 06/08/15  Intake/Output from previous day: 10/26 0701 - 10/27 0700 In: 1474 [P.O.:1302; I.V.:72; IV Piggyback:100] Out: 985 [Urine:800; Drains:185] Intake/Output this shift:    General appearance: alert, cooperative and no distress GI: soft; tender in RUQ at drain site, but less tender than before drain Bloody bilious drainage in bag  Lab Results:   Recent Labs  06/08/15 0430 06/09/15 0358  WBC 13.4* 9.1  HGB 13.2 13.0  HCT 39.7 39.6  PLT 143* 168   BMET  Recent Labs  06/08/15 1009 06/09/15 0358  NA 136 135  K 3.9 3.8  CL 102 101  CO2 24 26  GLUCOSE 130* 152*  BUN 16 19  CREATININE 1.20 1.35*  CALCIUM 8.9 8.6*   PT/INR  Recent Labs  06/08/15 0430 06/09/15 0358  LABPROT 19.3* 18.0*  INR 1.63* 1.48   ABG No results for input(s): PHART, HCO3 in the last 72 hours.  Invalid input(s): PCO2, PO2  Studies/Results: Ir Perc Cholecystostomy  06/07/2015  CLINICAL DATA:  59 year old male with calculus cholecystitis (confirmed by nuclear medicine HIDA scan). He is currently not a surgical candidate secondary to recent placement of drug-eluting coronary stents approximately 2.5 weeks ago. He is on Plavix and cannot come off for at least 6-9 months. Additionally, he was on Coumadin therapy. After vitamin K administration, his INR is 1.56. Due to persistent fever and increasing leukocytosis despite broad-spectrum antibiotic therapy, the decision to proceed with percutaneous cholecystostomy tube was made. EXAM:  CHOLECYSTOSTOMY Date: 06/07/2015 PROCEDURE: 1. Ultrasound-guided transhepatic puncture of the gallbladder 2. Placement of a 10 French percutaneous cholecystostomy tube under fluoroscopic guidance Interventional Radiologist:  Criselda Peaches, MD ANESTHESIA/SEDATION: Moderate (conscious) sedation was used. 2 mg Versed, 100 mcg Fentanyl were administered intravenously. The patient's vital signs were monitored continuously by radiology nursing throughout the procedure. Sedation Time: 15 minutes MEDICATIONS: Zosyn 3.375 g infusing at the beginning of the procedure FLUOROSCOPY TIME:  1 minute 48 mGy CONTRAST:  16mL OMNIPAQUE IOHEXOL 300 MG/ML  SOLN TECHNIQUE: Informed consent was obtained from the patient following explanation of the procedure, risks, benefits and alternatives. The patient understands, agrees and consents for the procedure. All questions were addressed. A time out was performed. Maximal barrier sterile technique utilized including caps, mask, sterile gowns, sterile gloves, large sterile drape, hand hygiene, and Betadine skin prep. The right upper quadrant was interrogated with ultrasound. The gallbladder is mildly distended. There is diffuse gallbladder wall thickening. Cholelithiasis is evident. A suitable skin entry site was selected and marked. Local anesthesia was attained by infiltration with 1% lidocaine. Under real-time sonographic guidance, a small dermatotomy was made. Under real-time sonographic guidance, a 21 gauge Accustick needle was advanced through a short transhepatic course and into the gallbladder lumen. An image was obtained and stored for the medical record. Using standard technique, the Accustick needle was exchanged over a micro wire for the Accustick sheath. A gentle hand injection of contrast material confirmed placement within the gallbladder lumen and an obstructing stone in the  gallbladder neck. A short Amplatz wire was coiled within the gallbladder lumen. The Accustick  sheath was removed and a Cook 10.2 [place] all-purpose drainage catheter advanced over the wire and formed within the gallbladder lumen. Aspiration yields approximately 60 mL thick black bile. A sample was sent for Gram stain and culture. Did tube was gently flushing connected to gravity bag drainage. The tube was then secured to the skin with 0 Prolene suture and an adhesive fixation device. Overall, the patient tolerated the procedure well. COMPLICATIONS: None Estimated blood loss: 0 IMPRESSION: 1. Successful placement of a 10.2 French percutaneous transhepatic cholecystostomy tube for acute calculus cholecystitis. Signed, Criselda Peaches, MD [facility] Electronically Signed   By: Jacqulynn Cadet M.D.   On: 06/07/2015 15:52    Anti-infectives: Anti-infectives    Start     Dose/Rate Route Frequency Ordered Stop   06/06/15 1000  piperacillin-tazobactam (ZOSYN) IVPB 3.375 g     3.375 g 12.5 mL/hr over 240 Minutes Intravenous 3 times per day 06/06/15 0942     06/04/15 2300  cefTRIAXone (ROCEPHIN) 2 g in dextrose 5 % 50 mL IVPB  Status:  Discontinued     2 g 100 mL/hr over 30 Minutes Intravenous Every 24 hours 06/04/15 2222 06/06/15 9030      Assessment/Plan: s/p * No surgery found * s/p percutaneous cholecystostomy tube placement for acute cholecystitis  OK for discharge from surgical standpoint Follow-up arranged We will sign off for now.   LOS: 5 days    Elayne Gruver K. 06/09/2015

## 2015-06-09 NOTE — Progress Notes (Signed)
Cardiology Office Note   Date:  06/10/2015   ID:  [name], DOB 1956-01-12, MRN [id number]  PCP:  Angelica Chessman, MD  Cardiologist:  Dr. Loralie Champagne   Electrophysiologist:  n/a  Chief Complaint  Patient presents with  . Follow-up  . Atrial Fibrillation  . Coronary Artery Disease     History of Present Illness: [name] [name] is a 59 y.o. male with a hx of HTN, DM, PAF, CAD, CKD. LHC in 5/13 demonstrated severe distal and branch vessel disease. There was no target for PCI. Disease was felt to be responsible for CP and med Rx was continued. He had improvement with Ranolazine in the past. Echo in 5/13 demonstrated normal LVF. Admitted in 5/14 with hypertensive urgency. He ran out of medications. Cardiac MRI in 2014 demonstrated EF 44%. Seen in the office by D. Dunn, PA-C 10/2013 after a trip to the ED with CP and he was found to be in AFib with RVR. Diltiazem 120 QD was added for rate control. He was back in NSR in the office.Echo 2/16 demonstrated EF 55-60% and moderate diastolic dysfunction. Myoview in 9/16 demonstrated inferior ischemia. He underwent right and left-sided heart catheterization. Filling pressures were not significantly elevated. He did have severe distal RCA stenosis demonstrated with DES 2. Repeat echo 9/16 demonstrated EF 55-60% and moderate diastolic dysfunction. Last seen by Dr. Aundra Dubin 9/16.   Plan is to continue aspirin, Plavix and Coumadin 1 month. He will then remain on Plavix and Coumadin.   Admitted this week with acute cholecystitis with cholelithiasis. Cholecystotomy tube was placed. He was treated with antibiotics. Hospitalization complicated by acute kidney injury. ARB and Lasix were held. No discharge summary is available. However, review of the chart indicates that the case was discussed with cardiology. It was recommended the patient remain on Plavix. If surgery needed, Coumadin could be held. His INR was supratherapeutic. Coumadin  was held briefly. Coumadin was resumed and he was bridged with Lovenox. General surgery plans to follow-up with him as an outpatient.  Returns for follow-up. He is doing fairly well. His abdominal pain is much improved. He did have chest pain when he presented to the hospital. However, this resolved. Cardiac enzymes were negative 3. He denies shortness of breath. Denies orthopnea, PND or edema.   Studies:  R/L [facility] 05/03/15 LM: Okay LAD: Ostial 40%, mid 40-50%, distal 50% RI: Small branch with Ostial 90%, moderate size second branch ostial 90%, proximal and mid 40% in main RI LCx: Small, OM1 50% mid RCA: Mid 40-50%, distal 95% EF 55-60% RA mean 8, RV 36/80, PA 39/20, mean 28, PCWP mean 14 PCI: 2.5 x 16 Synergy DES to the distal RCA, 3 x 12 Synergy DES to the proximal RCA  Myoview 9/16 Partially reversible (SDS 4) moderate-sized and intensity inferior perfusion defect, which suggests ischemia, however patient motion noted during the study and mis-registration artifact is possible. Dilated LV with severe hypokinesis - LVEF 26%. This is a high risk study.  Echo 9/16 Mild LVH, EF 55-60%, normal wall motion, grade 2 diastolic dysfunction, trivial AI, mild MR, moderate LAE, normal RVSF, mild TR  LHC (5/13):  LHC (5/13) with diffuse distal and branch vessel disease, mild global hypokinesis and EF 45%  Echo (5/14):  inferbasal HK, mild LVH, EF 50-55%, trivial AI, mild MR, mild LAE  Nuclear (5/14):  Inf defect c/w thinning vs infarct, no ischemia, EF 31%; High Risk  cMRI (5/14):  Mild LVH, EF 44%, normal RV size and function,  small areas of subepicardial delayed enhancement of the mid anteroseptal and mid inferoseptal RV insertion sites-nonspecific finding    Past Medical History  Diagnosis Date  . Hypertension   . Gout   . PAF (paroxysmal atrial fibrillation) (HCC)     On coumadin  . LIVER FUNCTION TESTS, ABNORMAL, HX OF 12/30/2006  . Rectal bleeding 12/18/2011    Scheduled for  colonoscopy.    [name] HEPATITIS B, CHRONIC 12/30/2006  . HYPERCHOLESTEROLEMIA 07/11/2010  . MITRAL REGURGITATION 12/30/2006  . COLONIC POLYPS, HX OF 12/30/2006  . CAD (coronary artery disease), native coronary artery     a. Nonobstructive CAD by cath 2013 - diffuse distal and branch vessel CAD, no severe disease in the major coronaries, LV mild global hypokinesis, EF 45%. b. ETT-Sestamibi 5/14: EF 31%, small fixed inferior defect with no ischemia.  . Chronic CHF ([place])     a. Mixed ICM/NICM (?EtOH). EF 35% in 2008. Echo 5/13: EF 60-65%, mod LVH, EF 45% on V gram in 12/2011. EF 12/2012: EF 50-55%, mild LVH, inferobasal HK, mild MR. ETT-Ses 5/14 EF 41%. Cardiac MRI 5/14: EF 44%, mild global HK, subepicardial delayed enhancement in nonspecific RV insertion pattern.  . H/O atrial flutter 2007    a. Ablations in 2007, 2008.  [name] History of medication noncompliance   . History of alcohol abuse   . 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 Heart murmur   . Anginal pain ([place])   . Sleep apnea     "suppose to send mask but they never did" (05/03/2015)  . Type II diabetes mellitus ([place])   . Left sciatic nerve pain since 04/2015  . Anxiety   . Cholecystitis   1. DM2 2. Gout 3. HTN: Cough with ACEI.  4. Cardiomyopathy: Suspect mixed ischemic/nonischemic (?ETOH-related). EF 35% in 2008. Echo in 5/13 showed EF 60-65% with moderate LVH but EF was 45% on LV-gram in 5/13. Echo (5/14) with EF 50-55%, mild LVH, inferobasal HK, mild MR. ETT-Sestamibi in 5/14, however, showed EF 31%. Cardiac MRI (6/14) with EF 44%, mild global hypokinesis, subepicardial delayed enhancement in a nonspecific RV insertion site pattern. Echo (2/16) with EF 55-60%, moderate diastolic dysfunction. RHC (9/16) with mean RA 8, PA 39/20 mean 28, mean PCWP 14, CI 2.2. Echo (9/16) with EF 55-60%, grade II diastolic dysfunction.  5. CAD: LHC in 2010 with mild nonobstructive disease. LHC (5/13) with diffuse distal and branch vessel  disease, mild global hypokinesis and EF 45%. ETT-Sestamibi (5/14): EF 31%, small fixed inferior defect with no ischemia. Lexiscan Cardiolite (9/16) with EF 26%, moderate inferior defect that was partially reversible, suggesting ischemia => High risk study. LHC (9/16) with 40-50% mLAD, diffuse up to 50% distal LAD, 90% ostium of branch off ramus, 95% dRCA => DES to RCA x 2, EF 55-60%.  6. H/o chronic HBV 7. Osteochondroma left shoulder.  8. Hyperlipidemia 9. Paroxysmal atrial fibrillation: Coumadin. Developed cough and increased ESR with amiodarone.  10. Atrial flutter: had ablations in 2007 and 2008.  11. OSA on CPAP.    Past Surgical History  Procedure Laterality Date  . Osteochondroma excision Left 1972    "took bone tumor off my shoulder"  . Coronary angioplasty  12/05/01  . A flutter ablation  2007, 2008    catheter ablation   . Left heart catheterization with coronary angiogram N/A 12/19/2011    Procedure: LEFT HEART CATHETERIZATION WITH CORONARY ANGIOGRAM;  Surgeon: Larey Dresser, MD;  Location: [facility] CATH LAB;  Service:  Cardiovascular;  Laterality: N/A;  . Cardiac catheterization N/A 05/03/2015    Procedure: Right/Left Heart Cath and Coronary Angiography;  Surgeon: Larey Dresser, MD;  Location: [facility] CV LAB;  Service: Cardiovascular;  Laterality: N/A;  . Cardiac catheterization N/A 05/03/2015    Procedure: Coronary Stent Intervention;  Surgeon: Jettie Booze, MD;  Location: [facility] CV LAB;  Service: Cardiovascular;  Laterality: N/A;     Current Outpatient Prescriptions  Medication Sig Dispense Refill  . acetaminophen (TYLENOL) 500 MG tablet Take 1,000 mg by mouth every 6 (six) hours as needed (pain).    [name] amLODipine (NORVASC) 10 MG tablet Take 10 mg by mouth daily.    [name] atorvastatin (LIPITOR) 80 MG tablet Take 1 tablet (80 mg total) by mouth daily. 90 tablet 3  . bismuth subsalicylate (PEPTO BISMOL) 262 MG/15ML suspension Take 30 mLs by mouth every 6 (six)  hours as needed for indigestion (stomach pain).    . carvedilol (COREG) 25 MG tablet Take 25 mg by mouth 2 (two) times daily with a meal.    . cefdinir (OMNICEF) 300 MG capsule Take 1 capsule (300 mg total) by mouth 2 (two) times daily. 14 capsule 0  . clopidogrel (PLAVIX) 75 MG tablet Take 1 tablet (75 mg total) by mouth daily with breakfast. 90 tablet 3  . enoxaparin (LOVENOX) 150 MG/ML injection Inject 1 mL (150 mg total) into the skin daily. 5 Syringe 0  . furosemide (LASIX) 40 MG tablet Take 40 mg by mouth daily.     . hydrALAZINE (APRESOLINE) 50 MG tablet Take 50 mg by mouth 3 (three) times daily.    . insulin glargine (LANTUS) 100 UNIT/ML injection Inject 0.65 mLs (65 Units total) into the skin every morning. (Patient taking differently: Inject 65 Units into the skin daily. ) 10 mL 3  . isosorbide mononitrate (IMDUR) 60 MG 24 hr tablet Take 60 mg by mouth at bedtime.    . metFORMIN (GLUCOPHAGE) 1000 MG tablet Take 1 tablet (1,000 mg total) by mouth 2 (two) times daily with a meal. 180 tablet 3  . metroNIDAZOLE (FLAGYL) 500 MG tablet Take 1 tablet (500 mg total) by mouth 2 (two) times daily. 14 tablet 0  . nitroGLYCERIN (NITROSTAT) 0.4 MG SL tablet Place 1 tablet (0.4 mg total) under the tongue every 5 (five) minutes as needed for chest pain. 25 tablet 3  . spironolactone (ALDACTONE) 25 MG tablet     . warfarin (COUMADIN) 5 MG tablet Take 1 tablet (5 mg total) by mouth as directed. (Patient taking differently: Take 2.5-5 mg by mouth at bedtime. Take 1/2 tablet (2.5 mg) by mouth on Wednesday, take 1 tablet (5 mg) on Sunday, Monday, Tuesday, Thursday, Friday, Saturday) 90 tablet 3   No current facility-administered medications for this visit.    Allergies:   Other and Ace inhibitors    Social History:   Social History   Social History  . Marital Status: Widowed    Spouse Name: N/A  . Number of Children: 2  . Years of Education: N/A   Occupational History  .     Social History  Main Topics  . Smoking status: Former Smoker -- 4.00 packs/day for 18 years    Types: Cigarettes    Quit date: 08/14/1983  . Smokeless tobacco: Never Used  . Alcohol Use: 9.6 oz/week    6 Cans of beer, 10 Shots of liquor per week     Comment: 05/03/2015 "1-2 beers q 3 days; 4-5  shots of liquor twice/wk"  . Drug Use: Yes    Special: Marijuana     Comment: 05/03/2015 "twice/wk maybe"  . Sexual Activity: No   Other Topics Concern  . None   Social History Narrative     Family History:   Family History  Problem Relation Age of Onset  . Diabetes Mother   . Hypertension Mother   . Heart attack Neg Hx   . Stroke Neg Hx       ROS:   Please see the history of present illness.   Review of Systems  Cardiovascular: Positive for chest pain and dyspnea on exertion.  Respiratory: Positive for snoring.   Gastrointestinal: Positive for abdominal pain, constipation, nausea and vomiting.  All other systems reviewed and are negative.     PHYSICAL EXAM: VS:  BP 160/82 mmHg  Pulse 82  Ht 6' (1.829 m)  Wt 209 lb 1.9 oz (94.856 kg)  BMI 28.36 kg/m2    Wt Readings from Last 3 Encounters:  06/10/15 209 lb 1.9 oz (94.856 kg)  06/09/15 213 lb 3 oz (96.7 kg)  05/26/15 213 lb (96.616 kg)     GEN: Well nourished, well developed, in no acute distress HEENT: normal Neck: no JVD at 90, no masses Cardiac:  Normal S1/S2, RRR; no murmur ,  no rubs or gallops, no edema   Respiratory:  clear to auscultation bilaterally, no wheezing, rhonchi or rales. GI:  distended, drain dressing clean and dry MS: no deformity or atrophy Skin: warm and dry  Neuro:  CNs II-XII intact, Strength and sensation are intact Psych: Normal affect   EKG:  EKG is ordered today.  It demonstrates:   NSR, HR 82, TWI 2, 3, aVF, V6, no change from prior tracing, QTc 453 ms   Recent Labs: 04/22/2015: Pro B Natriuretic peptide (BNP) 59.0 06/04/2015: Magnesium 1.6* 06/07/2015: ALT 15* 06/09/2015: BUN 19; Creatinine, Ser  1.35*; Hemoglobin 13.0; Platelets 168; Potassium 3.8; Sodium 135    Lipid Panel    Component Value Date/Time   CHOL 191 04/22/2015 1559   TRIG 333.0* 04/22/2015 1559   HDL 39.80 04/22/2015 1559   CHOLHDL 5 04/22/2015 1559   VLDL 66.6* 04/22/2015 1559   LDLCALC 54 11/11/2013 1500   LDLDIRECT 111.0 04/22/2015 1559      ASSESSMENT AND PLAN:  1. CAD: Recent cardiac catheterization demonstrated diffuse CAD and severe distal RCA stenosis. He is now status post DES times to the RCA. As noted, he remained on aspirin, Plavix and Coumadin 1 month. He is now on Plavix + Coumadin. Goal INR will be 2-2.5 while on Plavix. His Coumadin was reversed in the hospital and he is now on Lovenox. This will be continued until his INR is 2 or greater. He did have some chest symptoms when he presented to the hospital. Cardiac enzymes are negative. This was either gallbladder colic or ischemic pain related to his branch vessel disease. Continue amlodipine, statin, beta blocker, Plavix, nitrates.  2. PAF: Maintaining NSR. Continue Coumadin.  Follow-up INR today. DC Lovenox once INR 2 or greater.  3. Hypertension:  Blood pressure elevated. He just left the hospital last night. Losartan and Lasix were stopped due to acute kidney injury. Continue current therapy. Repeat BMET early next week. Try to resume losartan if creatinine improved.  4. Hyperlipidemia:  Continue statin.  5. Cardiomyopathy: Cardiac MRI 2014 demonstrated EF 47%. Recent Myoview with EF 26%. Follow-up echocardiogram demonstrated EF 55-60%. LV gram appear to demonstrate normal LV function as  well. Continue beta blocker.  Lasix, losartan, spironolactone all currently on hold given acute kidney injury in the setting of cholecystitis. As noted, BMET will be repeated next week. We will slowly try to resume his medications.  6. OSA:  Continue CPAP.   7. Cholecystitis:  Follow-up with general surgery as planned.  Hopefully, cholecystectomy can be put off  until much later. He will need to remain on Plavix without interruption given recent DES placement for at least one year.  8. Chronic Diastolic CHF:  I asked him to monitor his legs for edema. If he develops edema worsening shortness of breath, he should call.     Medication Changes: Current medicines are reviewed at length with the patient today.  Concerns regarding medicines are as outlined above.  The following changes have been made:   Discontinued Medications   AMLODIPINE (NORVASC) 10 MG TABLET    Take 1 tablet (10 mg total) by mouth daily.   CARVEDILOL (COREG) 25 MG TABLET    Take 1 tablet (25 mg total) by mouth 2 (two) times daily with a meal.   HYDRALAZINE (APRESOLINE) 50 MG TABLET    Take 1 tablet (50 mg total) by mouth 3 (three) times daily.   HYDROCODONE-ACETAMINOPHEN (NORCO/VICODIN) 5-325 MG PER TABLET    Take 1 tablet by mouth every 6 (six) hours as needed.   ISOSORBIDE MONONITRATE (IMDUR) 60 MG 24 HR TABLET    Take 1 tablet (60 mg total) by mouth daily.   PANTOPRAZOLE (PROTONIX) 40 MG TABLET    Take 1 tablet (40 mg total) by mouth daily.   Modified Medications   No medications on file   New Prescriptions   No medications on file   Labs/ tests ordered today include:   Orders Placed This Encounter  Procedures  . Basic Metabolic Panel (BMET)  . EKG 12-Lead      Disposition:    FU with me in 2-3 weeks and Dr. Loralie Champagne in 12/16 as planned.     Signed, Versie Starks, MHS 06/10/2015 10:40 AM    [facility] [place], [place], [place]  [address] Phone: [phone]; Fax: [phone]

## 2015-06-09 NOTE — Discharge Instructions (Signed)
You were admitted for a gallbladder infection. While you were here you were given antibiotics and medication to help with the pain. You also had surgery to drain the infection from your gallbladder. Please follow up with your surgeon in 3 weeks to check on the tube. The appointment time and location is  listed above.  While you were here we changed some of your medications, please take the medications as stated in the details above on your discharge instructions. You will be sent home with Coumadin, Plavix, and Enoxaparin. You will only be taking Enoxaparin for 5 days until we can get your blood thinner. Please take the antibiotics (Metronidazole and Cefdinir) as prescribed.  You will need to go to Dr. Christa See office tomorrow at 10 AM to get your INR checked. Checking your INR means checking to see how thin your blood is.  Additionally, your kidney's are still not 100% back to normal, we will need to hold off on taking the Lasix and Losartan for now.   In Summary:  New medications to take: Coumadin, Lovenox, Cefdinir, Metronidazole Medications to hold off on taking until hospital follow up with PCP: Lasix, K-DUR, Spironolactone, and Losartan Very important to go to your follow up appointments!     Cholecystitis Cholecystitis is inflammation of the gallbladder. It is often called a gallbladder attack. The gallbladder is a pear-shaped organ that lies beneath the liver on the right side of the body. The gallbladder stores bile, which is a fluid that helps the body to digest fats. If bile builds up in your gallbladder, your gallbladder becomes inflamed. This condition may occur suddenly (be acute). Repeat episodes of acute cholecystitis or prolonged episodes may lead to a long-term (chronic) condition. Cholecystitis is serious and it requires treatment.  CAUSES The most common cause of this condition is gallstones. Gallstones can block the tube (duct) that carries bile out of your gallbladder. This  causes bile to build up. Other causes of this condition include:  Damage to the gallbladder due to a decrease in blood flow.  Infections in the bile ducts.  Scars or kinks in the bile ducts.  Tumors in the liver, pancreas, or gallbladder. RISK FACTORS This condition is more likely to develop in:  People who have sickle cell disease.  People who take birth control pills or use estrogen.  People who have alcoholic liver disease.  People who have liver cirrhosis.  People who have their nutrition delivered through a vein (parenteral nutrition).  People who do not eat or drink (do fasting) for a long period of time.  People who are obese.  People who have rapid weight loss.  People who are pregnant.  People who have increased triglyceride levels.  People who have pancreatitis. SYMPTOMS Symptoms of this condition include:  Abdominal pain, especially in the upper right area of the abdomen.  Abdominal tenderness or bloating.  Nausea.  Vomiting.  Fever.  Chills.  Yellowing of the skin and the whites of the eyes (jaundice). DIAGNOSIS This condition is diagnosed with a medical history and physical exam. You may also have other tests, including:  Imaging tests, such as:  An ultrasound of the gallbladder.  A CT scan of the abdomen.  A gallbladder nuclear scan (HIDA scan). This scan allows your health care provider to see the bile moving from your liver to your gallbladder and to your small intestine.  MRI.  Blood tests, such as:  A complete blood count, because the white blood cell count may be  higher than normal.  Liver function tests, because some levels may be higher than normal with certain types of gallstones. TREATMENT Treatment may include:  Fasting for a certain amount of time.  IV fluids.  Medicine to treat pain or vomiting.  Antibiotic medicine.  Surgery to remove your gallbladder (cholecystectomy). This may happen immediately or at a later  time. [place] care will depend on your treatment. In general:  Take over-the-counter and prescription medicines only as told by your health care provider.  If you were prescribed an antibiotic medicine, take it as told by your health care provider. Do not stop taking the antibiotic even if you start to feel better.  Follow instructions from your health care provider about what to eat or drink. When you are allowed to eat, avoid eating or drinking anything that triggers your symptoms.  Keep all follow-up visits as told by your health care provider. This is important. SEEK MEDICAL CARE IF:  Your pain is not controlled with medicine.  You have a fever. SEEK IMMEDIATE MEDICAL CARE IF:  Your pain moves to another part of your abdomen or to your back.  You continue to have symptoms or you develop new symptoms even with treatment.   This information is not intended to replace advice given to you by your health care provider. Make sure you discuss any questions you have with your health care provider.   Document Released: 07/30/2005 Document Revised: 04/20/2015 Document Reviewed: 11/10/2014 Elsevier Interactive Patient Education 2016 [employer].   Prothrombin Time, International Normalized Ratio Test WHY AM I HAVING THIS TEST? A prothrombin time (Pro-Time, PT) test measures how many seconds it takes your blood to clot. The international normalized ratio (INR) is a calculation of blood clotting time based on your PT result. Most labs report both PT and INR values when reporting blood clotting times. Your health care provider may want you to have this test done if:  You have certain medical conditions that cause abnormal bleeding or blood clotting. These can include:  Liver disease.  Systemic infection (sepsis).  Inherited (genetic) bleeding disorders.  You are taking a medicine to prevent excessive blood clotting (anticoagulant), such as warfarin.  If you are  taking warfarin, you will likely be asked to have this test done at regular intervals. The results of this test will help your health care provider determine what dose of warfarin you need based on how quickly or slowly your blood clots. It is very important to have this test done as often as your health care provider recommends. WHAT KIND OF SAMPLE IS TAKEN? A blood sample is required for this test. It is usually collected by inserting a needle into a vein or by sticking a finger with a small needle. HOW DO I PREPARE FOR THE TEST? There is no preparation required for this test. WHAT ARE THE REFERENCE INTERVALS? Reference intervalsare considered healthy intervalsestablished after testing a large group of healthy people. Reference intervals may vary among different people, labs, and hospitals. It is your responsibility to obtain your test results. Ask the lab or department performing the test when and how you will get your results. Reference intervals for this test are as follows:  Without anticoagulant treatment (control value): 11.0-12.5 seconds; 85-100%.  With full anticoagulant treatment: greater than 1.5-2 times the control value; 20-30%.  INR: 0.8-1.1. WHAT DO THE RESULTS MEAN? There are several factors that can alter your PT and INR test results. It is important for you to  know that:  PT and INR results can be affected by certain foods you eat, especially foods that contain moderate or high amounts of vitamin K. It is important to eat a consistent amount of vitamin K-rich food. Let your health care provider know if you have recently changed your diet.  PT and INR results can be affected by some medicines. Do not stop, add, or change any medicines without letting your health care provider know. Talk with your health care provider to discuss your results, treatment options, and if necessary, the need for more tests. Talk with your health care provider if you have any questions about your  results.   This information is not intended to replace advice given to you by your health care provider. Make sure you discuss any questions you have with your health care provider.   Document Released: 09/01/2004 Document Revised: 08/20/2014 Document Reviewed: 12/23/2013 Elsevier Interactive Patient Education [employer].

## 2015-06-09 NOTE — Progress Notes (Signed)
Inpatient Diabetes Program Recommendations  AACE/ADA: New Consensus Statement on Inpatient Glycemic Control (2015)  Target Ranges:  Prepandial:   less than 140 mg/dL      Peak postprandial:   less than 180 mg/dL (1-2 hours)      Critically ill patients:  140 - 180 mg/dL   Review of Glycemic Control  Results for [name] [name], [name] [name] (MRN [id number]) as of 06/09/2015 11:53  Ref. Range 05/09/2015 14:31  Hemoglobin A1C  Unknown 8.5   Results for [name] [name], [name] [name] (MRN [id number]) as of 06/09/2015 11:53  Ref. Range 06/08/2015 07:49 06/08/2015 12:22 06/08/2015 17:17 06/08/2015 21:44 06/09/2015 07:53  Glucose-Capillary Latest Ref Range: 65-99 mg/dL 125 (H) 255 (H) 134 (H) 151 (H) 191 (H)    Diabetes history: Type 2 Outpatient Diabetes medications: Lantus 65 units qam, Metformin 1000mg  bid Current orders for Inpatient glycemic control: Lantus 65 units qam, Novolog 0-9 units tid with meals, Novolog 0-5 units qhs  Inpatient Diabetes Program Recommendations: post prandial blood sugars despite current order for Novolog tid insulin.  Consider increasing correction insulin to Novolog moderate correction 0-15 units tid.  Gentry Fitz, RN, BA, MHA, CDE Diabetes Coordinator Inpatient Diabetes Program  [phone] (Team Pager) [phone] ([place]) 06/09/2015 12:01 PM

## 2015-06-09 NOTE — Care Management (Addendum)
S/W MARIE @ [facility] # [phone]   LOVENOX 40 MG DAILY FOR 4 DAYS   NOT COVER, IF NEEDED CAN CALL AND GET PRIOR APPROVAL  TO ADD TO FORMULARY # [phone]   GENERIC :  ENOXAPARIN 40 MG DAILY FOR 4 DAYS  COVER- YES  CO-PAY- $ 2.95  PRIOR APPROVAL - NO  PHARMACY: [facility] # [phone]   Paged Family medicine pager [phone]

## 2015-06-09 NOTE — Progress Notes (Signed)
Family Medicine Teaching Service Daily Progress Note Intern Pager: [phone]  Patient name: [name] [name] Medical record number: [id number] Date of birth: November 25, 1955 Age: 59 y.o. Gender: male  Primary Care Provider: Angelica Chessman, MD Consultants: surgery, IR Code Status: Full   Assessment and Plan: [name] [name] is a 59 y.o. male presenting with Symptomatic Cholelithiasis. PMH is significant for CAD s/p DES x 2 in the RCA (9/16), CHF rEF mixed etiology though most recent Echo EF 55-60%, DMII, HTN, OSA (Needs CPAP device at home), Paroxysmal A Fib (hx of A flutter s/p ablation '07/'08), Chronic Anticoagulation, HLD, Gout, GERD, ? Chronic Hep B.   Acute Cholecysitis with Cholelithiasis s/p cholecystotomy tube - Had Perc drain placed for cholecystitis. Pain relieved with procedure. A little bloody output.  - Surgery and IR consulted, appreciate their assistance.  - Current Recs: 1. ADAT. 2. IV abx with PO at d/c for 1 more week (will discuss with them what their preference is for PO) 3. F/U with sg 3-4 weeks. 4. Resume anticoagulation. Possible interval cholecystectomy at some point in the future.  - Empiric Ceftriaxone (10/22-10/23), Zosyn started per surgery (10/24 -  ), will DC IV abx today and begin PO Cefdinir + Flagyl for 1 week. Will DC today if we can get an appointment for him to get INR checked tomorrow.  - Restarted coumadin.  - Bridge with lovenox to therapeutic INR.  INR 1.48  this am. - Pain control with PRN Dilaudid 1 mg q2 - Phenergan for nausea   AKI - Mild. Uptrending creatinine.  - Holding ARB, nephrotoxic meds - Taking PO now.  - Will give small amount of fluids. Fairly good EF on last Echo   CAD s/p DES x 2 in the RCA - Stable here. Continue medical management.  - Continue Plavix.  - Holding ASA - Coumadin restarted. Bridging with lovenox.   CHFrEF : Mixed etiology ETOH? / Ischemic. Last EF noted 55-60% with heart cath. Managed medically as an outpatient.  Euvolemic.  - Hold Lasix for now - Will monitor fluid status closely.  - I/O's - Daily weights.   Paroxysmal Afib (Hx of A-Flutter s/p Ablation 8299,3716); Hx of RVR at one time as well. Normal sinus rhythm here. On coumadin.  - Continue medical management.  - resuming coumadin. Bridge with lovenox.   - Monitor PT/INR. Goal INR 2-3  DMII - A1C 8 at last check.  - Resume Lantus 65 u q am once taking regular diet.  - Moderate SSI.  - CBG monitoring.   HTN - BP elevated here. - Continue Coreg, Hydralazine, Imdur, Spironolactone. KDUR 20 meq given daily.  - Holding lasix and Losartan given AKI.   OSA - symptoms include waking up at night gasping for breath, but no particular orthopnea. He says that he is waiting for his machine to be delivered to his house.  - CPAP here. Autotitrate.   GERD- continue protonix.   HLD- continue lipitor  Hx of ETOH Abuse - says that he has been cutting back on his drinking - says he drinks 1-2 drinks every other day now. Used to drink much more and says he had an ETOH abuse problem. Also MJ, but otherwise no other substances.  - CIWA protocol   Chronic Hep B - listed in chart. No labs to support this. Pt. Without knowledge of this diagnosis. Hep Panel negative - Hep panel negative. Hep B sag, Hep B cIgM.   FEN/GI: Regular diet, Protonix Prophylaxis: Lovenox, Coumadin  Disposition: Pending improvement in AKI, transition to po antibiotics.   Subjective:  Patient's RUQ pain has improved. He still admits to some minor pain after the procedure but nothing like before. He does have some bloody drainage from perc tube but at an expected amount. Patient denies fevers, nausea, vomiting. He has a good appetite. Patient expresses good understanding that he will need to follow up with the surgeon for the tube.   Objective: Temp:  [98.4 F (36.9 C)-99.4 F (37.4 C)] 99.3 F (37.4 C) (10/27 0531) Pulse Rate:  [76-90] 90 (10/27 0823) Resp:   [17-20] 17 (10/27 0531) BP: (112-153)/(49-84) 153/84 mmHg (10/27 0823) SpO2:  [93 %-95 %] 95 % (10/27 0531) Weight:  [213 lb 3 oz (96.7 kg)] 213 lb 3 oz (96.7 kg) (10/27 0500) Physical Exam: General: NAD, AAOx3, comfortable.  Cardiovascular: RRR, no MGR, normal S1/S2 Respiratory: CTA bilaterally, no increased work of breathing Abdomen:soft, Mild distension, some slight tenderness around the perc drain site, dressing c/d/i, some bloody drainage in drain approx. ~30cc Extremities: no edema, no TTP.   Laboratory:  Recent Labs Lab 06/07/15 0417 06/08/15 0430 06/09/15 0358  WBC 14.8* 13.4* 9.1  HGB 13.3 13.2 13.0  HCT 40.1 39.7 39.6  PLT 151 143* 168    Recent Labs Lab 06/04/15 1612  06/06/15 1516 06/07/15 0417 06/08/15 1009 06/09/15 0358  NA  --   < > 137 137 136 135  K  --   < > 3.8 3.6 3.9 3.8  CL  --   < > 100* 99* 102 101  CO2  --   < > 29 27 24 26   BUN  --   < > 13 12 16 19   CREATININE  --   < > 1.37* 1.39* 1.20 1.35*  CALCIUM  --   < > 8.9 9.1 8.9 8.6*  PROT 7.3  --  6.9 7.1  --   --   BILITOT 0.5  --  1.1 1.3*  --   --   ALKPHOS 48  --  47 58  --   --   ALT 14*  --  13* 15*  --   --   AST 19  --  16 19  --   --   GLUCOSE  --   < > 140* 136* 130* 152*  < > = values in this interval not displayed.  Troponin (Point of Care Test) No results for input(s): TROPIPOC in the last 72 hours.  Imaging/Diagnostic Tests: No results found.   Carlyle Dolly, MD 06/09/2015, 9:08 AM PGY-1, Welcome Intern pager: [phone], text pages welcome

## 2015-06-10 ENCOUNTER — Telehealth: Payer: Self-pay | Admitting: Internal Medicine

## 2015-06-10 ENCOUNTER — Ambulatory Visit (INDEPENDENT_AMBULATORY_CARE_PROVIDER_SITE_OTHER): Payer: Medicare Other | Admitting: Physician Assistant

## 2015-06-10 ENCOUNTER — Ambulatory Visit (INDEPENDENT_AMBULATORY_CARE_PROVIDER_SITE_OTHER): Payer: Medicare Other | Admitting: Pharmacist

## 2015-06-10 ENCOUNTER — Other Ambulatory Visit: Payer: Self-pay | Admitting: *Deleted

## 2015-06-10 ENCOUNTER — Encounter: Payer: Self-pay | Admitting: Physician Assistant

## 2015-06-10 ENCOUNTER — Telehealth: Payer: Self-pay | Admitting: *Deleted

## 2015-06-10 ENCOUNTER — Other Ambulatory Visit: Payer: Medicare Other

## 2015-06-10 VITALS — BP 160/82 | HR 82 | Ht 72.0 in | Wt 209.1 lb

## 2015-06-10 DIAGNOSIS — E785 Hyperlipidemia, unspecified: Secondary | ICD-10-CM

## 2015-06-10 DIAGNOSIS — I429 Cardiomyopathy, unspecified: Secondary | ICD-10-CM

## 2015-06-10 DIAGNOSIS — Z7901 Long term (current) use of anticoagulants: Secondary | ICD-10-CM | POA: Diagnosis not present

## 2015-06-10 DIAGNOSIS — Z5181 Encounter for therapeutic drug level monitoring: Secondary | ICD-10-CM

## 2015-06-10 DIAGNOSIS — I480 Paroxysmal atrial fibrillation: Secondary | ICD-10-CM

## 2015-06-10 DIAGNOSIS — I4891 Unspecified atrial fibrillation: Secondary | ICD-10-CM

## 2015-06-10 DIAGNOSIS — G4733 Obstructive sleep apnea (adult) (pediatric): Secondary | ICD-10-CM

## 2015-06-10 DIAGNOSIS — I10 Essential (primary) hypertension: Secondary | ICD-10-CM | POA: Diagnosis not present

## 2015-06-10 DIAGNOSIS — I5032 Chronic diastolic (congestive) heart failure: Secondary | ICD-10-CM

## 2015-06-10 DIAGNOSIS — K810 Acute cholecystitis: Secondary | ICD-10-CM

## 2015-06-10 DIAGNOSIS — I428 Other cardiomyopathies: Secondary | ICD-10-CM

## 2015-06-10 DIAGNOSIS — I2510 Atherosclerotic heart disease of native coronary artery without angina pectoris: Secondary | ICD-10-CM | POA: Diagnosis not present

## 2015-06-10 LAB — POCT INR: INR: 1.8

## 2015-06-10 MED ORDER — CARVEDILOL 25 MG PO TABS: 25.0000 mg | ORAL_TABLET | Freq: Two times a day (BID) | ORAL | Status: DC

## 2015-06-10 MED ORDER — INSULIN GLARGINE 100 UNIT/ML ~~LOC~~ SOLN: 65.0000 [IU] | Freq: Every day | SUBCUTANEOUS | Status: DC

## 2015-06-10 NOTE — Patient Instructions (Signed)
Medication Instructions:  Your physician recommends that you continue on your current medications as directed. Please refer to the Current Medication list given to you today.   Labwork: BMET EITHER ON Monday 10/31 OR Tuesday 06/14/15  Testing/Procedures: NONE  Follow-Up: DR. Aundra Dubin 07/2015  Any Other Special Instructions Will Be Listed Below (If Applicable). WE WILL CHECK YOUR INR TODAY   CALL IF YOU HAVE INCREASED SHORTNESS OF BREATH OR INCREASE LEG EDEMA  If you need a refill on your cardiac medications before your next appointment, please call your pharmacy.

## 2015-06-10 NOTE — Telephone Encounter (Signed)
Patient Lantus refilled for one month. Patient must schedule appointment before future refills will be given.  Medical Assistant unable to leave message due to full VM and invalid home phone.

## 2015-06-10 NOTE — Telephone Encounter (Signed)
I tried to call pt becuase saw that he did make his f/u appt Scott w. PA for 2-3 wks, appt w/Dr. Aundra Dubin in 07/2015 or his repeat lab work next week. I will try to reach pt later today.

## 2015-06-10 NOTE — Telephone Encounter (Signed)
Patient is calling to request a refill on his insulin glargine (LANTUS) 100 UNIT/ML injection . Please fu with pt. Thank you, Fonda Kinder, ASA

## 2015-06-10 NOTE — Telephone Encounter (Signed)
DPR on file for daughter Mariel Craft. I explained pt left today w/o making his appt's. BMET 11/2 same day as CVRR; PA appt 11/14 @ 2:40; Dr. Aundra Dubin 12/15 @ 1:15. Daughter thanked me for my call.

## 2015-06-12 LAB — CULTURE, BODY FLUID W GRAM STAIN -BOTTLE

## 2015-06-12 LAB — CULTURE, BODY FLUID-BOTTLE: CULTURE: NO GROWTH

## 2015-06-15 ENCOUNTER — Ambulatory Visit (INDEPENDENT_AMBULATORY_CARE_PROVIDER_SITE_OTHER): Payer: Medicare Other | Admitting: *Deleted

## 2015-06-15 DIAGNOSIS — I4891 Unspecified atrial fibrillation: Secondary | ICD-10-CM

## 2015-06-15 DIAGNOSIS — Z7901 Long term (current) use of anticoagulants: Secondary | ICD-10-CM | POA: Diagnosis not present

## 2015-06-15 DIAGNOSIS — Z5181 Encounter for therapeutic drug level monitoring: Secondary | ICD-10-CM

## 2015-06-15 LAB — BASIC METABOLIC PANEL
BUN: 13 mg/dL (ref 7–25)
CALCIUM: 9 mg/dL (ref 8.6–10.3)
CO2: 22 mmol/L (ref 20–31)
Chloride: 106 mmol/L (ref 98–110)
Creat: 1.07 mg/dL (ref 0.70–1.25)
GLUCOSE: 124 mg/dL — AB (ref 65–99)
POTASSIUM: 3.6 mmol/L (ref 3.5–5.3)
SODIUM: 137 mmol/L (ref 135–146)

## 2015-06-15 LAB — POCT INR: INR: 3.9

## 2015-06-22 ENCOUNTER — Ambulatory Visit (INDEPENDENT_AMBULATORY_CARE_PROVIDER_SITE_OTHER): Payer: Medicare Other | Admitting: *Deleted

## 2015-06-22 DIAGNOSIS — Z7901 Long term (current) use of anticoagulants: Secondary | ICD-10-CM | POA: Diagnosis not present

## 2015-06-22 DIAGNOSIS — Z5181 Encounter for therapeutic drug level monitoring: Secondary | ICD-10-CM | POA: Diagnosis not present

## 2015-06-22 DIAGNOSIS — I4891 Unspecified atrial fibrillation: Secondary | ICD-10-CM | POA: Diagnosis not present

## 2015-06-22 LAB — POCT INR: INR: 2.1

## 2015-06-30 DIAGNOSIS — K8000 Calculus of gallbladder with acute cholecystitis without obstruction: Secondary | ICD-10-CM | POA: Diagnosis not present

## 2015-06-30 DIAGNOSIS — Z434 Encounter for attention to other artificial openings of digestive tract: Secondary | ICD-10-CM | POA: Diagnosis not present

## 2015-07-01 ENCOUNTER — Other Ambulatory Visit (HOSPITAL_COMMUNITY): Payer: Self-pay | Admitting: General Surgery

## 2015-07-01 DIAGNOSIS — L0291 Cutaneous abscess, unspecified: Secondary | ICD-10-CM

## 2015-07-01 DIAGNOSIS — K8063 Calculus of gallbladder and bile duct with acute cholecystitis with obstruction: Secondary | ICD-10-CM

## 2015-07-04 ENCOUNTER — Other Ambulatory Visit: Payer: Self-pay | Admitting: General Surgery

## 2015-07-04 DIAGNOSIS — L02211 Cutaneous abscess of abdominal wall: Secondary | ICD-10-CM

## 2015-07-11 ENCOUNTER — Other Ambulatory Visit: Payer: Medicare Other

## 2015-07-12 ENCOUNTER — Other Ambulatory Visit (INDEPENDENT_AMBULATORY_CARE_PROVIDER_SITE_OTHER): Payer: Medicare Other

## 2015-07-12 ENCOUNTER — Ambulatory Visit (INDEPENDENT_AMBULATORY_CARE_PROVIDER_SITE_OTHER): Payer: Medicare Other | Admitting: *Deleted

## 2015-07-12 DIAGNOSIS — I4891 Unspecified atrial fibrillation: Secondary | ICD-10-CM | POA: Diagnosis not present

## 2015-07-12 DIAGNOSIS — I5042 Chronic combined systolic (congestive) and diastolic (congestive) heart failure: Secondary | ICD-10-CM

## 2015-07-12 DIAGNOSIS — Z7901 Long term (current) use of anticoagulants: Secondary | ICD-10-CM | POA: Diagnosis not present

## 2015-07-12 DIAGNOSIS — R0602 Shortness of breath: Secondary | ICD-10-CM

## 2015-07-12 DIAGNOSIS — I10 Essential (primary) hypertension: Secondary | ICD-10-CM | POA: Diagnosis not present

## 2015-07-12 DIAGNOSIS — Z5181 Encounter for therapeutic drug level monitoring: Secondary | ICD-10-CM | POA: Diagnosis not present

## 2015-07-12 LAB — LIPID PANEL
CHOL/HDL RATIO: 2.4 ratio (ref <=5.0)
CHOLESTEROL: 104 mg/dL — AB (ref 125–200)
HDL: 44 mg/dL (ref >=40)
LDL Cholesterol: 44 mg/dL (ref <130)
TRIGLYCERIDES: 78 mg/dL (ref <150)
VLDL: 16 mg/dL (ref <30)

## 2015-07-12 LAB — HEPATIC FUNCTION PANEL
ALT: 11 U/L (ref 9–46)
AST: 12 U/L (ref 10–35)
Albumin: 3.7 g/dL (ref 3.6–5.1)
Alkaline Phosphatase: 51 U/L (ref 40–115)
BILIRUBIN DIRECT: 0.2 mg/dL (ref <=0.2)
Indirect Bilirubin: 0.7 mg/dL (ref 0.2–1.2)
TOTAL PROTEIN: 7.1 g/dL (ref 6.1–8.1)
Total Bilirubin: 0.9 mg/dL (ref 0.2–1.2)

## 2015-07-12 LAB — POCT INR: INR: 1.4

## 2015-07-12 NOTE — Addendum Note (Signed)
Addended by: Stephannie Peters on: 07/12/2015 09:20 AM   Modules accepted: Orders

## 2015-07-19 ENCOUNTER — Ambulatory Visit
Admission: RE | Admit: 2015-07-19 | Discharge: 2015-07-19 | Disposition: A | Discharge status: Home / Self Care | Payer: Medicare Other | Source: Ambulatory Visit | Attending: General Surgery | Admitting: General Surgery

## 2015-07-19 ENCOUNTER — Other Ambulatory Visit: Payer: Self-pay | Admitting: Internal Medicine

## 2015-07-19 ENCOUNTER — Ambulatory Visit (INDEPENDENT_AMBULATORY_CARE_PROVIDER_SITE_OTHER): Payer: Medicare Other

## 2015-07-19 DIAGNOSIS — L02211 Cutaneous abscess of abdominal wall: Secondary | ICD-10-CM

## 2015-07-19 DIAGNOSIS — I4891 Unspecified atrial fibrillation: Secondary | ICD-10-CM | POA: Diagnosis not present

## 2015-07-19 DIAGNOSIS — Z7901 Long term (current) use of anticoagulants: Secondary | ICD-10-CM | POA: Diagnosis not present

## 2015-07-19 DIAGNOSIS — K828 Other specified diseases of gallbladder: Secondary | ICD-10-CM | POA: Diagnosis not present

## 2015-07-19 DIAGNOSIS — K810 Acute cholecystitis: Secondary | ICD-10-CM | POA: Diagnosis not present

## 2015-07-19 DIAGNOSIS — Z5181 Encounter for therapeutic drug level monitoring: Secondary | ICD-10-CM

## 2015-07-19 DIAGNOSIS — K8010 Calculus of gallbladder with chronic cholecystitis without obstruction: Secondary | ICD-10-CM | POA: Diagnosis not present

## 2015-07-19 LAB — POCT INR: INR: 2.6

## 2015-07-19 MED ORDER — IOPAMIDOL (ISOVUE-300) INJECTION 61%
125.0000 mL | Freq: Once | INTRAVENOUS | Status: AC | PRN
  Administered 2015-07-19: 125 mL via INTRAVENOUS

## 2015-07-19 NOTE — Progress Notes (Signed)
Referring Physician(s): Wilson,Eric  Chief Complaint: The patient is seen in follow up today s/p  1. Successful placement of a 10.2 French percutaneous transhepatic cholecystostomy tube for acute calculus cholecystitis-- 06/07/2015  History of present illness:  Pt scheduled today for CT scan and cholecystostomy drain injection for evaluation. He is doing quite well. Eating well denies N/V Denies fever Denies pain Flushing drain 5 cc daily; 100 cc output - bilious- daily Not using antibiotic at this time Plan for cholecystectomy with [facility] surgeon in  approx 4-5 months per pt. Hx recent cardiac event with DES placed just before cholecystomy drain placement Remains on Plavix now daily.   Past Medical History  Diagnosis Date  . Hypertension   . Gout   . PAF (paroxysmal atrial fibrillation) (HCC)     On coumadin  . LIVER FUNCTION TESTS, ABNORMAL, HX OF 12/30/2006  . Rectal bleeding 12/18/2011    Scheduled for colonoscopy.    [name] HEPATITIS B, CHRONIC 12/30/2006  . HYPERCHOLESTEROLEMIA 07/11/2010  . MITRAL REGURGITATION 12/30/2006  . COLONIC POLYPS, HX OF 12/30/2006  . CAD (coronary artery disease), native coronary artery     a. Nonobstructive CAD by cath 2013 - diffuse distal and branch vessel CAD, no severe disease in the major coronaries, LV mild global hypokinesis, EF 45%. b. ETT-Sestamibi 5/14: EF 31%, small fixed inferior defect with no ischemia.  . Chronic CHF ([place])     a. Mixed ICM/NICM (?EtOH). EF 35% in 2008. Echo 5/13: EF 60-65%, mod LVH, EF 45% on V gram in 12/2011. EF 12/2012: EF 50-55%, mild LVH, inferobasal HK, mild MR. ETT-Ses 5/14 EF 41%. Cardiac MRI 5/14: EF 44%, mild global HK, subepicardial delayed enhancement in nonspecific RV insertion pattern.  . H/O atrial flutter 2007    a. Ablations in 2007, 2008.  [name] History of medication noncompliance   . History of alcohol abuse   . 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 Heart murmur   . Anginal pain ([place])     . Sleep apnea     "suppose to send mask but they never did" (05/03/2015)  . Type II diabetes mellitus ([place])   . Left sciatic nerve pain since 04/2015  . Anxiety   . Cholecystitis     Past Surgical History  Procedure Laterality Date  . Osteochondroma excision Left 1972    "took bone tumor off my shoulder"  . Coronary angioplasty  12/05/01  . A flutter ablation  2007, 2008    catheter ablation   . Left heart catheterization with coronary angiogram N/A 12/19/2011    Procedure: LEFT HEART CATHETERIZATION WITH CORONARY ANGIOGRAM;  Surgeon: Larey Dresser, MD;  Location: [facility] CATH LAB;  Service: Cardiovascular;  Laterality: N/A;  . Cardiac catheterization N/A 05/03/2015    Procedure: Right/Left Heart Cath and Coronary Angiography;  Surgeon: Larey Dresser, MD;  Location: [facility] CV LAB;  Service: Cardiovascular;  Laterality: N/A;  . Cardiac catheterization N/A 05/03/2015    Procedure: Coronary Stent Intervention;  Surgeon: Jettie Booze, MD;  Location: [facility] CV LAB;  Service: Cardiovascular;  Laterality: N/A;    Allergies: Other and Ace inhibitors  Medications: Prior to Admission medications   Medication Sig Start Date End Date Taking? Authorizing Provider  acetaminophen (TYLENOL) 500 MG tablet Take 1,000 mg by mouth every 6 (six) hours as needed (pain).    Historical Provider, MD  amLODipine (NORVASC) 10 MG tablet Take 10 mg by mouth daily.    Historical Provider,  MD  atorvastatin (LIPITOR) 80 MG tablet Take 1 tablet (80 mg total) by mouth daily. 04/29/15   Larey Dresser, MD  bismuth subsalicylate (PEPTO BISMOL) 262 MG/15ML suspension Take 30 mLs by mouth every 6 (six) hours as needed for indigestion (stomach pain).    Historical Provider, MD  carvedilol (COREG) 25 MG tablet Take 1 tablet (25 mg total) by mouth 2 (two) times daily with a meal. 06/10/15   Liliane Shi, PA-C  cefdinir (OMNICEF) 300 MG capsule Take 1 capsule (300 mg total) by mouth 2 (two) times daily.  06/09/15   Asiyah Cletis Media, MD  clopidogrel (PLAVIX) 75 MG tablet Take 1 tablet (75 mg total) by mouth daily with breakfast. 05/04/15   Almyra Deforest, PA  furosemide (LASIX) 40 MG tablet Take 40 mg by mouth daily.  04/19/15   Historical Provider, MD  hydrALAZINE (APRESOLINE) 50 MG tablet Take 50 mg by mouth 3 (three) times daily.    Historical Provider, MD  insulin glargine (LANTUS) 100 UNIT/ML injection Inject 0.65 mLs (65 Units total) into the skin daily. 06/10/15   Tresa Garter, MD  isosorbide mononitrate (IMDUR) 60 MG 24 hr tablet Take 60 mg by mouth at bedtime.    Historical Provider, MD  metFORMIN (GLUCOPHAGE) 1000 MG tablet Take 1 tablet (1,000 mg total) by mouth 2 (two) times daily with a meal. 05/18/14   Tresa Garter, MD  metroNIDAZOLE (FLAGYL) 500 MG tablet Take 1 tablet (500 mg total) by mouth 2 (two) times daily. 06/09/15   Asiyah Cletis Media, MD  nitroGLYCERIN (NITROSTAT) 0.4 MG SL tablet Place 0.4 mg under the tongue every 5 (five) minutes as needed for chest pain (x 3 tabs daily).    Historical Provider, MD  spironolactone (ALDACTONE) 25 MG tablet Take 25 mg by mouth daily.  06/01/15   Historical Provider, MD  warfarin (COUMADIN) 5 MG tablet Take 1 tablet (5 mg total) by mouth as directed. Patient taking differently: Take 2.5-5 mg by mouth at bedtime. Take 1/2 tablet (2.5 mg) by mouth on Wednesday, take 1 tablet (5 mg) on Sunday, Monday, Tuesday, Thursday, Friday, Saturday 05/18/14   Tresa Garter, MD     Family History  Problem Relation Age of Onset  . Diabetes Mother   . Hypertension Mother   . Heart attack Neg Hx   . Stroke Neg Hx     Social History   Social History  . Marital Status: Widowed    Spouse Name: N/A  . Number of Children: 2  . Years of Education: N/A   Occupational History  .     Social History Main Topics  . Smoking status: Former Smoker -- 4.00 packs/day for 18 years    Types: Cigarettes    Quit date: 08/14/1983  . Smokeless  tobacco: Never Used  . Alcohol Use: 9.6 oz/week    6 Cans of beer, 10 Shots of liquor per week     Comment: 05/03/2015 "1-2 beers q 3 days; 4-5 shots of liquor twice/wk"  . Drug Use: Yes    Special: Marijuana     Comment: 05/03/2015 "twice/wk maybe"  . Sexual Activity: No   Other Topics Concern  . Not on file   Social History Narrative     Vital Signs: BP: 179/106 (pt aware and follows with PMD); O2 sat 100 % RA; P 81; T: 98.4  Physical Exam  Constitutional: He is oriented to person, place, and time.  Abdominal: Soft. Bowel sounds are normal.  There is no tenderness.  Skin site of chole drain is clean and dry NT; no bleeding No sign of infection Output bilious in bag: approx 30 cc now    Musculoskeletal: Normal range of motion.  Neurological: He is alert and oriented to person, place, and time.  Skin: Skin is warm and dry.  Psychiatric: He has a normal mood and affect. His behavior is normal. Judgment and thought content normal.  Vitals reviewed.   Imaging: No results found.  Labs:  CBC:  Recent Labs  06/06/15 1516 06/07/15 0417 06/08/15 0430 06/09/15 0358  WBC 12.3* 14.8* 13.4* 9.1  HGB 13.6 13.3 13.2 13.0  HCT 41.2 40.1 39.7 39.6  PLT 132* 151 143* 168    COAGS:  Recent Labs  06/10/15 1051 06/15/15 0925 06/22/15 1406 07/12/15 0851  INR 1.8 3.9 2.1 1.4    BMP:  Recent Labs  06/06/15 1516 06/07/15 0417 06/08/15 1009 06/09/15 0358 06/15/15 1011  NA 137 137 136 135 137  K 3.8 3.6 3.9 3.8 3.6  CL 100* 99* 102 101 106  CO2 29 27 24 26 22   GLUCOSE 140* 136* 130* 152* 124*  BUN 13 12 16 19 13   CALCIUM 8.9 9.1 8.9 8.6* 9.0  CREATININE 1.37* 1.39* 1.20 1.35* 1.07  GFRNONAA 54* 53* >60 55*  --   GFRAA >60 >60 >60 >60  --     LIVER FUNCTION TESTS:  Recent Labs  06/04/15 1612 06/06/15 1516 06/07/15 0417 07/12/15 0920  BILITOT 0.5 1.1 1.3* 0.9  AST 19 16 19 12   ALT 14* 13* 15* 11  ALKPHOS 48 47 58 51  PROT 7.3 6.9 7.1 7.1  ALBUMIN  3.8 3.1* 3.1* 3.7    Assessment:  Calculus cholecystitis Percutaneous cholecystostomy drain placed 06/07/15 Doing well Drain  Injection does reveal patency of bile duct and flow to duodenum But also reveals existing gall bladder stones Dr Earleen Newport discussed findings with pt. Plan to keep drain in place for now to drain bag. Follow with [facility] Will send note to [facility] schedule for 6 mo follow up drain injection in case needed   Signed: Edric Fetterman A 07/19/2015, 9:07 AM   Please refer to Dr.Wagner attestation of note for management and plan.

## 2015-07-22 ENCOUNTER — Other Ambulatory Visit: Payer: Self-pay | Admitting: *Deleted

## 2015-07-22 ENCOUNTER — Telehealth: Payer: Self-pay | Admitting: Endocrinology

## 2015-07-22 ENCOUNTER — Telehealth: Payer: Self-pay | Admitting: Internal Medicine

## 2015-07-22 DIAGNOSIS — E119 Type 2 diabetes mellitus without complications: Secondary | ICD-10-CM

## 2015-07-22 MED ORDER — METFORMIN HCL 1000 MG PO TABS: 1000.0000 mg | ORAL_TABLET | Freq: Two times a day (BID) | ORAL | Status: DC

## 2015-07-22 NOTE — Telephone Encounter (Signed)
Patient verified DOB Patient informed of last OV being 05/18/14. Patient advised to call 08/15/15 to schedule appointment. Medical Assistant informed patient of metformin being refilled with a corteousy fill to cover patient until appointment is kept in January. Patient expressed his understanding and stated he would call Jan 2 to obtain a January appointment. Patient had no further questions.

## 2015-07-22 NOTE — Telephone Encounter (Signed)
Rx submitted per pt's PCP.

## 2015-07-22 NOTE — Telephone Encounter (Signed)
Patient called and requested a med refill for metFORMIN (GLUCOPHAGE) 1000 MG tablet.  °Please f/u with pt.  °

## 2015-07-22 NOTE — Telephone Encounter (Signed)
Patient need a refill of metformin send to   [facility] ([facility] [place] [id number] (Phone) [phone] (Fax)

## 2015-07-26 ENCOUNTER — Ambulatory Visit: Payer: Medicare Other | Admitting: Dietician

## 2015-07-26 ENCOUNTER — Ambulatory Visit: Payer: Medicare Other | Admitting: Endocrinology

## 2015-07-28 ENCOUNTER — Encounter: Payer: Self-pay | Admitting: Cardiology

## 2015-07-28 ENCOUNTER — Ambulatory Visit (INDEPENDENT_AMBULATORY_CARE_PROVIDER_SITE_OTHER): Payer: Medicare Other | Admitting: Cardiology

## 2015-07-28 VITALS — BP 132/80 | HR 91 | Ht 72.0 in | Wt 214.0 lb

## 2015-07-28 DIAGNOSIS — I2510 Atherosclerotic heart disease of native coronary artery without angina pectoris: Secondary | ICD-10-CM | POA: Diagnosis not present

## 2015-07-28 DIAGNOSIS — I5032 Chronic diastolic (congestive) heart failure: Secondary | ICD-10-CM

## 2015-07-28 DIAGNOSIS — I480 Paroxysmal atrial fibrillation: Secondary | ICD-10-CM | POA: Diagnosis not present

## 2015-07-28 DIAGNOSIS — K8000 Calculus of gallbladder with acute cholecystitis without obstruction: Secondary | ICD-10-CM | POA: Diagnosis not present

## 2015-07-28 MED ORDER — AMLODIPINE BESYLATE 10 MG PO TABS: 10.0000 mg | ORAL_TABLET | Freq: Every day | ORAL | Status: DC

## 2015-07-28 MED ORDER — CLOPIDOGREL BISULFATE 75 MG PO TABS: 75.0000 mg | ORAL_TABLET | Freq: Every day | ORAL | Status: DC

## 2015-07-28 MED ORDER — CARVEDILOL 25 MG PO TABS: 25.0000 mg | ORAL_TABLET | Freq: Two times a day (BID) | ORAL | Status: DC

## 2015-07-28 MED ORDER — SPIRONOLACTONE 25 MG PO TABS: 25.0000 mg | ORAL_TABLET | Freq: Every day | ORAL | Status: DC

## 2015-07-28 NOTE — Progress Notes (Signed)
Patient ID: [name] [name], male   DOB: [birth year], 59 y.o.   MRN: [id number] PCP: Dr. Doreene Burke  59 yo with history of HTN, diabetes, paroxysmal atrial fibrillation, and CAD presents for cardiology followup.  He was admitted in 5/13 with chest pain with exertion.  LHC was done showing global HK with EF 45% and diffuse, severe distal and branch vessel disease.  There was no interventional option, but I suspect that this disease could be causing his anginal-type pain.  Echo at that time was read as showing EF 60-65%.  He was admitted in 5/14 with hypertensive urgency and chest pain.  He ruled out for MI and BP was controlled.  His EF was 50-55% by echo.  ETT-Sestamibi done as outpatient showed small fixed inferior defect with no ischemia but EF was 31%.  He was quite hypertensive during the study.  Cardiac MRI done to confirm EF in 5/14 showed EF 44%.  He had an episode of atrial fibrillation/RVR in 3/15 but went back into NSR.  Echo in 2/16 showed EF 55-60% with moderate diastolic dysfunction.   He had a Cardiolite in 9/16 showing inferior ischemia.  He was having exertional dyspnea with less activity than normal.  I was concerned for worsened CAD, so took him for [facility] in 9/16. By RHC, filling pressures were not significantly elevated. He had a severe distal RCA stenosis that was treated with DES x 2.  Echo showed EF 55-60%.    Since my last appointment with him, he was admitted in 10/16 with acute cholecystitis.  He received a cholecystostomy tube.  He is off a number of his medications.  It seems like these were stopped during his cholecystitis hospitalization.  He is no longer taking amlodipine, Coreg, Laisx, hydralazine, or Imdur.  He is doing ok.  Still has cholecystostomy tube.  No chest pain.  Dyspnea only with strenuous activity.    Labs (5/13): K 3.6, creatinine 0.93, LDL 85, HDL 57 Labs (7/13): K 3.9, creatinine 1.1, LDL 57, HDL 51  Labs (5/14): K 4, creatinine 1.25 Labs (8/14): K 3.7,  creatinine 0.9, LDL 103, HDL 42 Labs (8/15): K 3.5 creatinine 1.0, BNP 75 Labs (2/16): K 3.8, creatinine 1.1 Labs (9/16): K 3.5, creatinine 1.03, BNP 59, HDL 40, LDL 111, TGs 333 Labs (11/16): K 3.6, creaitnine 1.07, LFTs normal, LDL 44  ECG: NSR, LVH, nonspecific T wave flattening.   PMH: 1. DM2 2. Gout 3. HTN: Cough with ACEI.  4. Cardiomyopathy: Suspect mixed ischemic/nonischemic (?ETOH-related).  EF 35% in 2008.  Echo in 5/13 showed EF 60-65% with moderate LVH but EF was 45% on LV-gram in 5/13.  Echo (5/14) with EF 50-55%, mild LVH, inferobasal HK, mild MR.  ETT-Sestamibi in 5/14, however, showed EF 31%.  Cardiac MRI (6/14) with EF 44%, mild global hypokinesis, subepicardial delayed enhancement in a nonspecific RV insertion site pattern.  Echo (2/16) with EF 55-60%, moderate diastolic dysfunction. RHC (9/16) with mean RA 8, PA 39/20 mean 28, mean PCWP 14, CI 2.2. Echo (9/16) with EF 55-60%, grade II diastolic dysfunction.  5. CAD: LHC in 2010 with mild nonobstructive disease.  LHC (5/13) with diffuse distal and branch vessel disease, mild global hypokinesis and EF 45%.  ETT-Sestamibi (5/14): EF 31%, small fixed inferior defect with no ischemia.  Lexiscan Cardiolite (9/16) with EF 26%, moderate inferior defect that was partially reversible, suggesting ischemia => High risk study. LHC (9/16) with 40-50% mLAD, diffuse up to 50% distal LAD, 90% ostium of branch off ramus,  95% dRCA => DES to RCA x 2, EF 55-60%.   6. H/o chronic HBV 7. Osteochondroma left shoulder.  8. Hyperlipidemia 9. Paroxysmal atrial fibrillation: Coumadin.  Developed cough and increased ESR with amiodarone.  10. Atrial flutter: had ablations in 2007 and 2008.  11. OSA on CPAP.  12. Acute cholecystitis (10/16): Cholecystostomy tube placed.   SH: Married, prior smoker.  Some ETOH, occasionally heavy in past.  Does use occasional marijuana. Out of work Engineer, drilling. 2 daughters in grad school.   FH: CAD  ROS: All systems  reviewed and negative except as per HPI.   Current Outpatient Prescriptions  Medication Sig Dispense Refill  . acetaminophen (TYLENOL) 500 MG tablet Take 1,000 mg by mouth every 6 (six) hours as needed (pain).    [name] amLODipine (NORVASC) 10 MG tablet Take 1 tablet (10 mg total) by mouth daily. 30 tablet 6  . atorvastatin (LIPITOR) 80 MG tablet Take 1 tablet (80 mg total) by mouth daily. 90 tablet 3  . carvedilol (COREG) 25 MG tablet Take 1 tablet (25 mg total) by mouth 2 (two) times daily with a meal. 60 tablet 6  . clopidogrel (PLAVIX) 75 MG tablet Take 1 tablet (75 mg total) by mouth daily with breakfast. 30 tablet 6  . insulin glargine (LANTUS) 100 UNIT/ML injection Inject 0.65 mLs (65 Units total) into the skin daily. 10 mL 0  . metFORMIN (GLUCOPHAGE) 1000 MG tablet Take 1 tablet (1,000 mg total) by mouth 2 (two) times daily with a meal. 180 tablet 1  . nitroGLYCERIN (NITROSTAT) 0.4 MG SL tablet Place 0.4 mg under the tongue every 5 (five) minutes as needed for chest pain (x 3 tabs daily).    [name] spironolactone (ALDACTONE) 25 MG tablet Take 1 tablet (25 mg total) by mouth daily. 30 tablet 6  . warfarin (COUMADIN) 5 MG tablet Take 1 tablet (5 mg total) by mouth as directed. (Patient taking differently: Take 2.5-5 mg by mouth at bedtime. Take 1/2 tablet (2.5 mg) by mouth on Wednesday, take 1 tablet (5 mg) on Sunday, Monday, Tuesday, Thursday, Friday, Saturday) 90 tablet 3   No current facility-administered medications for this visit.    BP 132/80 mmHg  Pulse 91  Ht 6' (1.829 m)  Wt 214 lb (97.07 kg)  BMI 29.02 kg/m2 General: NAD Neck: JVP 7 cm, no thyromegaly or thyroid nodule.  Lungs: Clear to auscultation bilaterally with normal respiratory effort. CV: Nondisplaced PMI.  Heart regular S1/S2, no S3/S4, no murmur.  No peripheral edema.  No carotid bruit.  Normal pedal pulses.  Abdomen: Soft, nontender, no hepatosplenomegaly, no distention.  Neurologic: Alert and oriented x 3.  Psych:  Normal affect. Extremities: No clubbing or cyanosis.   Assessment/Plan:  Atrial fibrillation Paroxysmal atrial fibrillation, continue coumadin. Goal INR will be 2-2.5 while he is on Plavix also.  He is in NSR today.  Coronary artery disease LHC in 9/16 showed diffuse CAD, most significant involving the distal RCA.  Patient had DES x 2 to RCA. Symptomatically doing better, minimal exertional dyspnea now.  - He is currently on Plavix + warfarin with plan to continue Plavix x 1 year then stop it. However, he could potentially stop Plavix at 6 months (after March) briefly for cholecystectomy.  - Continue statin.  HYPERCHOLESTEROLEMIA  Good lipids in 11/16. Cardiomyopathy  EF 47% on cardiac MRI in 6/14 but EF improved back to 55-60% on 2/16 echo.  However, EF down to 26% on Cardiolite in 9/16.  I think this  was inaccurate.  EF was 55-60% by both LV-gram and echo in 9/16.  He looks euvolemic on exam.  - He is no longer taking Lasix.  I think he can stay off Lasix.  Hypertension BP has been running high (though it is not high today).  He has been off several antihypertensives.  - Restart Coreg 25 mg bid and amlodipine 10 mg daily.  He will continue to stay off hydralazine and losartan for now. OSA Continue CPAP.  Cholecystitis He now has a cholecystostomy tube in.  He will eventually need cholecystectomy.  With new generation DES placed, could consider coming off Plavix for surgery at 6 months, which would be the end of March.   Loralie Champagne 07/28/2015

## 2015-07-29 ENCOUNTER — Telehealth: Payer: Self-pay | Admitting: Endocrinology

## 2015-07-29 NOTE — Telephone Encounter (Signed)
Patient no showed today's appt. Please advise on how to follow up. °A. No follow up necessary. °B. Follow up urgent. Contact patient immediately. °C. Follow up necessary. Contact patient and schedule visit in ___ days. °D. Follow up advised. Contact patient and schedule visit in ____weeks. ° °

## 2015-07-31 NOTE — Telephone Encounter (Signed)
Follow up advised. Contact patient and schedule visit in 6 weeks. 

## 2015-08-01 NOTE — Telephone Encounter (Signed)
No show letter mailed to the patient.  

## 2015-08-04 ENCOUNTER — Ambulatory Visit (INDEPENDENT_AMBULATORY_CARE_PROVIDER_SITE_OTHER): Payer: Medicare Other | Admitting: Endocrinology

## 2015-08-04 ENCOUNTER — Encounter: Payer: Self-pay | Admitting: Endocrinology

## 2015-08-04 ENCOUNTER — Ambulatory Visit (INDEPENDENT_AMBULATORY_CARE_PROVIDER_SITE_OTHER): Payer: Medicare Other | Admitting: *Deleted

## 2015-08-04 VITALS — BP 136/88 | HR 89 | Temp 98.2°F | Ht 73.0 in | Wt 214.0 lb

## 2015-08-04 DIAGNOSIS — I4891 Unspecified atrial fibrillation: Secondary | ICD-10-CM | POA: Diagnosis not present

## 2015-08-04 DIAGNOSIS — Z7901 Long term (current) use of anticoagulants: Secondary | ICD-10-CM

## 2015-08-04 DIAGNOSIS — E1151 Type 2 diabetes mellitus with diabetic peripheral angiopathy without gangrene: Secondary | ICD-10-CM | POA: Diagnosis not present

## 2015-08-04 DIAGNOSIS — Z5181 Encounter for therapeutic drug level monitoring: Secondary | ICD-10-CM

## 2015-08-04 DIAGNOSIS — I2510 Atherosclerotic heart disease of native coronary artery without angina pectoris: Secondary | ICD-10-CM | POA: Diagnosis not present

## 2015-08-04 LAB — POCT INR: INR: 2.6

## 2015-08-04 LAB — POCT GLYCOSYLATED HEMOGLOBIN (HGB A1C): Hemoglobin A1C: 6.8

## 2015-08-04 MED ORDER — INSULIN GLARGINE 100 UNIT/ML SOLOSTAR PEN: 60.0000 [IU] | PEN_INJECTOR | SUBCUTANEOUS | Status: DC

## 2015-08-04 NOTE — Patient Instructions (Addendum)
check your blood sugar twice a day.  vary the time of day when you check, between before the 3 meals, and at bedtime.  also check if you have symptoms of your blood sugar being too high or too low.  please keep a record of the readings and bring it to your next appointment here.  You can write it on any piece of paper.  please call us sooner if your blood sugar goes below 70, or if you have a lot of readings over 200.   Please reduce the lantus to 60 units each morning. On this type of insulin schedule, you should eat meals on a regular schedule.  If a meal is missed or significantly delayed, your blood sugar could go low.    Please continue the same metformin. Please come back for a follow-up appointment in 3 months.

## 2015-08-04 NOTE — Progress Notes (Signed)
Subjective:    Patient ID: [name] [name], male    DOB: 07/29/1956, 59 y.o.   MRN: [id number]  HPI Pt returns for f/u of diabetes mellitus: DM type: Insulin-requiring type 2 Dx'ed: 2008.  Complications: CAD and PAD.  Therapy: insulin (since 2011) and metformin. DKA: never.   Severe hypoglycemia: never. Pancreatitis: never Other: pt takes qd insulin, due to difficulty remembering multiple daily injections.   Interval history: no cbg record, but states cbg's are seldom low, and these episodes are mild.  Past Medical History  Diagnosis Date  . Hypertension   . Gout   . PAF (paroxysmal atrial fibrillation) (HCC)     On coumadin  . LIVER FUNCTION TESTS, ABNORMAL, HX OF 12/30/2006  . Rectal bleeding 12/18/2011    Scheduled for colonoscopy.    [name] HEPATITIS B, CHRONIC 12/30/2006  . HYPERCHOLESTEROLEMIA 07/11/2010  . MITRAL REGURGITATION 12/30/2006  . COLONIC POLYPS, HX OF 12/30/2006  . CAD (coronary artery disease), native coronary artery     a. Nonobstructive CAD by cath 2013 - diffuse distal and branch vessel CAD, no severe disease in the major coronaries, LV mild global hypokinesis, EF 45%. b. ETT-Sestamibi 5/14: EF 31%, small fixed inferior defect with no ischemia.  . Chronic CHF ([place])     a. Mixed ICM/NICM (?EtOH). EF 35% in 2008. Echo 5/13: EF 60-65%, mod LVH, EF 45% on V gram in 12/2011. EF 12/2012: EF 50-55%, mild LVH, inferobasal HK, mild MR. ETT-Ses 5/14 EF 41%. Cardiac MRI 5/14: EF 44%, mild global HK, subepicardial delayed enhancement in nonspecific RV insertion pattern.  . H/O atrial flutter 2007    a. Ablations in 2007, 2008.  [name] History of medication noncompliance   . History of alcohol abuse   . 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 Heart murmur   . Anginal pain ([place])   . Sleep apnea     "suppose to send mask but they never did" (05/03/2015)  . Type II diabetes mellitus ([place])   . Left sciatic nerve pain since 04/2015  . Anxiety   . Cholecystitis     Past  Surgical History  Procedure Laterality Date  . Osteochondroma excision Left 1972    "took bone tumor off my shoulder"  . Coronary angioplasty  12/05/01  . A flutter ablation  2007, 2008    catheter ablation   . Left heart catheterization with coronary angiogram N/A 12/19/2011    Procedure: LEFT HEART CATHETERIZATION WITH CORONARY ANGIOGRAM;  Surgeon: Larey Dresser, MD;  Location[facility] C[facility] LAB;  Service: Cardiovascular;  Laterality: N/A;  . Cardiac catheterization N/A 05/03/2015    Procedure: Right/Left Heart Cath and Coronary Angiography;  Surgeon: Larey Dresser, MD;  Location: [facility] CV LAB;  Service: Cardiovascular;  Laterality: N/A;  . Cardiac catheterization N/A 05/03/2015    Procedure: Coronary Stent Intervention;  Surgeon: Jettie Booze, MD;  Location: [facility] CV LAB;  Service: Cardiovascular;  Laterality: N/A;    Social History   Social History  . Marital Status: Widowed    Spouse Name: N/A  . Number of Children: 2  . Years of Education: N/A   Occupational History  .     Social History Main Topics  . Smoking status: Former Smoker -- 4.00 packs/day for 18 years    Types: Cigarettes    Quit date: 08/14/1983  . Smokeless tobacco: Never Used  . Alcohol Use: 9.6 oz/week    6 Cans of beer, 10 Shots of  liquor per week     Comment: 05/03/2015 "1-2 beers q 3 days; 4-5 shots of liquor twice/wk"  . Drug Use: Yes    Special: Marijuana     Comment: 05/03/2015 "twice/wk maybe"  . Sexual Activity: No   Other Topics Concern  . Not on file   Social History Narrative    Current Outpatient Prescriptions on File Prior to Visit  Medication Sig Dispense Refill  . acetaminophen (TYLENOL) 500 MG tablet Take 1,000 mg by mouth every 6 (six) hours as needed (pain).    [name] amLODipine (NORVASC) 10 MG tablet Take 1 tablet (10 mg total) by mouth daily. 30 tablet 6  . atorvastatin (LIPITOR) 80 MG tablet Take 1 tablet (80 mg total) by mouth daily. 90 tablet 3  . carvedilol (COREG) 25  MG tablet Take 1 tablet (25 mg total) by mouth 2 (two) times daily with a meal. 60 tablet 6  . clopidogrel (PLAVIX) 75 MG tablet Take 1 tablet (75 mg total) by mouth daily with breakfast. 30 tablet 6  . metFORMIN (GLUCOPHAGE) 1000 MG tablet Take 1 tablet (1,000 mg total) by mouth 2 (two) times daily with a meal. 180 tablet 1  . nitroGLYCERIN (NITROSTAT) 0.4 MG SL tablet Place 0.4 mg under the tongue every 5 (five) minutes as needed for chest pain (x 3 tabs daily).    [name] spironolactone (ALDACTONE) 25 MG tablet Take 1 tablet (25 mg total) by mouth daily. 30 tablet 6  . warfarin (COUMADIN) 5 MG tablet Take 1 tablet (5 mg total) by mouth as directed. (Patient taking differently: Take 2.5-5 mg by mouth at bedtime. Take 1/2 tablet (2.5 mg) by mouth on Wednesday, take 1 tablet (5 mg) on Sunday, Monday, Tuesday, Thursday, Friday, Saturday) 90 tablet 3   No current facility-administered medications on file prior to visit.    Allergies  Allergen Reactions  . Other Hives    Patient reports developing hives after receiving "some antibiotic given in 1980''s at [facility]". He does not know which antibiotic.  [name] Ace Inhibitors Cough    Family History  Problem Relation Age of Onset  . Diabetes Mother   . Hypertension Mother   . Heart attack Neg Hx   . Stroke Neg Hx     BP 136/88 mmHg  Pulse 89  Temp(Src) 98.2 F (36.8 C) (Oral)  Ht 6\' 1"  (1.854 m)  Wt 214 lb (97.07 kg)  BMI 28.24 kg/m2  SpO2 97%  Review of Systems Denies LOC    Objective:   Physical Exam VITAL SIGNS:  See vs page GENERAL: no distress Pulses: dorsalis pedis intact bilat.   MSK: no deformity of the feet CV: no leg edema Skin:  no ulcer on the feet.  normal color and temp on the feet. Neuro: sensation is intact to touch on the feet.   Ext: There is bilateral onychomycosis of the toenails.    A1c=6.8%    Assessment & Plan:  DM: overcontrolled, given this regimen, which does match insulin to his changing needs  throughout the day.   Patient is advised the following: Patient Instructions  check your blood sugar twice a day.  vary the time of day when you check, between before the 3 meals, and at bedtime.  also check if you have symptoms of your blood sugar being too high or too low.  please keep a record of the readings and bring it to your next appointment here.  You can write it on any piece  of paper.  please call us sooner if your blood sugar goes below 70, or if you have a lot of readings over 200.   Please reduce the lantus to 60 units each morning. On this type of insulin schedule, you should eat meals on a regular schedule.  If a meal is missed or significantly delayed, your blood sugar could go low.    Please continue the same metformin. Please come back for a follow-up appointment in 3 months.

## 2015-08-18 ENCOUNTER — Encounter: Payer: Medicare Other | Attending: Endocrinology | Admitting: Dietician

## 2015-08-18 ENCOUNTER — Ambulatory Visit (INDEPENDENT_AMBULATORY_CARE_PROVIDER_SITE_OTHER): Payer: Medicare Other | Admitting: *Deleted

## 2015-08-18 ENCOUNTER — Encounter: Payer: Self-pay | Admitting: Dietician

## 2015-08-18 VITALS — Wt 217.0 lb

## 2015-08-18 DIAGNOSIS — I4891 Unspecified atrial fibrillation: Secondary | ICD-10-CM | POA: Diagnosis not present

## 2015-08-18 DIAGNOSIS — Z7901 Long term (current) use of anticoagulants: Secondary | ICD-10-CM

## 2015-08-18 DIAGNOSIS — Z794 Long term (current) use of insulin: Secondary | ICD-10-CM | POA: Insufficient documentation

## 2015-08-18 DIAGNOSIS — Z5181 Encounter for therapeutic drug level monitoring: Secondary | ICD-10-CM

## 2015-08-18 DIAGNOSIS — E118 Type 2 diabetes mellitus with unspecified complications: Secondary | ICD-10-CM

## 2015-08-18 DIAGNOSIS — Z713 Dietary counseling and surveillance: Secondary | ICD-10-CM | POA: Diagnosis not present

## 2015-08-18 LAB — POCT INR: INR: 3

## 2015-08-18 NOTE — Patient Instructions (Signed)
Ask your cardiologist if you should be taking the Norvasc (Amlodipine).  You are currently not taking this and it is on your medication list.   Call [facility] [phone]).  They can help you get back to appropriate exercise with your heart condition. If you cannot go to cardiac rehab then ask your cardiologist about walking. Avoid processed meat (ham, hotdogs)  Read labels to find meat that is lowest in sodium.  Fresh is always best. Continue avoiding extra salt. Do not skip meals. Check your blood sugar once a day and if you feel like it is low. If your blood sugar is more than 70 then eat a meal or snack or have 1 hard candy. If your blood sugar is less than 70 then drink 1/2 cup juice or regular soda and recheck your blood sugar in fifteen minutes and eat a meal or snack.

## 2015-08-18 NOTE — Progress Notes (Signed)
Diabetes Self-Management Education  Visit Type: Follow-up  Appt. Start Time: 1145 Appt. End Time: 1215  08/18/2015  [name], identified by name and date of birth, is a 60 y.o. male with a diagnosis of Diabetes: Type 2.   He is here alone today.  His daughter was with him on last visit.  He states that she has been helping him with his shopping and education about what he is buying.  He states that he is not using added salt, is aware that fast food and eating out give very high amounts of sodium, and continues to use increased sweets including sugar sweetened beverages.  He continues to drink liquor 2-4 servings per day "when a good game comes on".  Counseled to avoid.  He reports taking his medications as he should but on further discussion he states that at times he forgets if he took his medication or not.  We discussed getting a pill box to organize medications.  He is not exercising as he does not know what is allowed based on his cardiac condition.  States that he was told to follow up with cardiac rehab but has not.  He is only checking his blood sugar occasionally.  ASSESSMENT  Weight 217 lb (98.431 kg). Body mass index is 28.64 kg/(m^2).      Diabetes Self-Management Education - 08/18/15 1304    Visit Information   Visit Type Follow-up   Initial Visit   Diabetes Type Type 2   Are you currently following a meal plan? No   Are you taking your medications as prescribed? No   Date Diagnosed 2008 with insulin since 2011   Health Coping   How would you rate your overall health? Fair   Psychosocial Assessment   Patient Belief/Attitude about Diabetes Motivated to manage diabetes  scared  because his friends are getting amputations and dying due to diabetes   Complications   How often do you check your blood sugar? --  only occasionally   Number of hypoglycemic episodes per month --  occaional   Number of hyperglycemic episodes per week 1   Are you checking your feet?  Yes   How many days per week are you checking your feet? 7   Dietary Intake   Breakfast this am [employer] Sausage, egg, and cheese biscuit and sweet tea OR cornflakes or oatmeal with splenda OR sausage, eggs,grits, bread  10   Lunch ham sandwich or skips   Snack (afternoon) chips or sweets or fruit   Dinner meat, home fried potatoes, canned vegetables  8   Snack (evening) peanut butter and ritz crackers, chicken salad on ritz, vienna sausage, graham crackers   Beverage(s) diet soda, sugar free coolade, small amounts water (small amounts)  hard liquor 2-4 shots occasionally,   Exercise   Exercise Type ADL's   How many days per week to you exercise? 0   How many minutes per day do you exercise? 0   Total minutes per week of exercise 0   Patient Education   Previous Diabetes Education Yes (please comment)  last month   Nutrition management  Role of diet in the treatment of diabetes and the relationship between the three main macronutrients and blood glucose level;Meal timing in regards to the patients' current diabetes medication.   Monitoring Purpose and frequency of SMBG.   Individualized Goals (developed by patient)   Nutrition General guidelines for healthy choices and portions discussed   Physical Activity Exercise 3-5 times per week;15  minutes per day   Monitoring  test my blood glucose as discussed   Reducing Risk treat hypoglycemia with 15 grams of carbs if blood glucose less than 70mg /dL   Patient Self-Evaluation of Goals - Patient rates self as meeting previously set goals (% of time)   Nutrition < 25%   Physical Activity < 25%   Medications 50 - 75 %   Monitoring < 25%   Problem Solving < 25%   Reducing Risk < 25%   Health Coping < 25%   Outcomes   Expected Outcomes Other (comment)  Demonstrated interest in learning but question change.   Future DMSE PRN  Patient to follow up with cardiac rehab   Program Status Completed   Subsequent Visit   Since your last visit  have you continued or begun to take your medications as prescribed? No  Has a problem remembering at times.   Since your last visit have you had your blood pressure checked? Yes   Since your last visit have you experienced any weight changes? No change   Since your last visit, are you checking your blood glucose at least once a day? No      Individualized Plan for Diabetes Self-Management Training:   Learning Objective:  Patient will have a greater understanding of diabetes self-management. Patient education plan is to attend individual and/or group sessions per assessed needs and concerns.   Plan:   Patient Instructions  Ask your cardiologist if you should be taking the Norvasc (Amlodipine).  You are currently not taking this and it is on your medication list.   Call [facility] [phone]).  They can help you get back to appropriate exercise with your heart condition. If you cannot go to cardiac rehab then ask your cardiologist about walking. Avoid processed meat (ham, hotdogs)  Read labels to find meat that is lowest in sodium.  Fresh is always best. Continue avoiding extra salt. Do not skip meals. Check your blood sugar once a day and if you feel like it is low. If your blood sugar is more than 70 then eat a meal or snack or have 1 hard candy. If your blood sugar is less than 70 then drink 1/2 cup juice or regular soda and recheck your blood sugar in fifteen minutes and eat a meal or snack.     Expected Outcomes:  Other (comment) (Demonstrated interest in learning but question change.)  Education material provided:  Label reading  If problems or questions, patient to contact team via:  Phone  Future DSME appointment: PRN (Patient to follow up with cardiac rehab)

## 2015-08-22 ENCOUNTER — Encounter (HOSPITAL_COMMUNITY): Payer: Self-pay | Admitting: Family Medicine

## 2015-08-22 ENCOUNTER — Inpatient Hospital Stay (HOSPITAL_COMMUNITY)
Admission: EM | Admit: 2015-08-22 | Discharge: 2015-08-24 | DRG: 308 | Disposition: A | Discharge status: Home / Self Care | Payer: Medicare Other | Attending: Cardiology | Admitting: Cardiology

## 2015-08-22 ENCOUNTER — Telehealth: Payer: Self-pay | Admitting: Cardiology

## 2015-08-22 ENCOUNTER — Emergency Department (HOSPITAL_COMMUNITY): Payer: Medicare Other

## 2015-08-22 DIAGNOSIS — I5043 Acute on chronic combined systolic (congestive) and diastolic (congestive) heart failure: Secondary | ICD-10-CM | POA: Diagnosis present

## 2015-08-22 DIAGNOSIS — R0602 Shortness of breath: Secondary | ICD-10-CM

## 2015-08-22 DIAGNOSIS — R079 Chest pain, unspecified: Secondary | ICD-10-CM | POA: Diagnosis not present

## 2015-08-22 DIAGNOSIS — I110 Hypertensive heart disease with heart failure: Secondary | ICD-10-CM | POA: Diagnosis present

## 2015-08-22 DIAGNOSIS — G4733 Obstructive sleep apnea (adult) (pediatric): Secondary | ICD-10-CM | POA: Diagnosis present

## 2015-08-22 DIAGNOSIS — I129 Hypertensive chronic kidney disease with stage 1 through stage 4 chronic kidney disease, or unspecified chronic kidney disease: Secondary | ICD-10-CM | POA: Diagnosis not present

## 2015-08-22 DIAGNOSIS — K8020 Calculus of gallbladder without cholecystitis without obstruction: Secondary | ICD-10-CM | POA: Diagnosis present

## 2015-08-22 DIAGNOSIS — Z7901 Long term (current) use of anticoagulants: Secondary | ICD-10-CM

## 2015-08-22 DIAGNOSIS — E7800 Pure hypercholesterolemia, unspecified: Secondary | ICD-10-CM | POA: Diagnosis present

## 2015-08-22 DIAGNOSIS — I2510 Atherosclerotic heart disease of native coronary artery without angina pectoris: Secondary | ICD-10-CM

## 2015-08-22 DIAGNOSIS — Z7902 Long term (current) use of antithrombotics/antiplatelets: Secondary | ICD-10-CM

## 2015-08-22 DIAGNOSIS — I4891 Unspecified atrial fibrillation: Secondary | ICD-10-CM | POA: Diagnosis not present

## 2015-08-22 DIAGNOSIS — I5189 Other ill-defined heart diseases: Secondary | ICD-10-CM | POA: Diagnosis present

## 2015-08-22 DIAGNOSIS — Z9861 Coronary angioplasty status: Secondary | ICD-10-CM

## 2015-08-22 DIAGNOSIS — I484 Atypical atrial flutter: Secondary | ICD-10-CM | POA: Diagnosis present

## 2015-08-22 DIAGNOSIS — Z955 Presence of coronary angioplasty implant and graft: Secondary | ICD-10-CM

## 2015-08-22 DIAGNOSIS — I5033 Acute on chronic diastolic (congestive) heart failure: Secondary | ICD-10-CM

## 2015-08-22 DIAGNOSIS — N189 Chronic kidney disease, unspecified: Secondary | ICD-10-CM | POA: Diagnosis not present

## 2015-08-22 DIAGNOSIS — B181 Chronic viral hepatitis B without delta-agent: Secondary | ICD-10-CM | POA: Diagnosis present

## 2015-08-22 DIAGNOSIS — K8010 Calculus of gallbladder with chronic cholecystitis without obstruction: Secondary | ICD-10-CM | POA: Diagnosis present

## 2015-08-22 DIAGNOSIS — I4892 Unspecified atrial flutter: Secondary | ICD-10-CM | POA: Diagnosis not present

## 2015-08-22 DIAGNOSIS — I340 Nonrheumatic mitral (valve) insufficiency: Secondary | ICD-10-CM | POA: Diagnosis present

## 2015-08-22 DIAGNOSIS — Z87891 Personal history of nicotine dependence: Secondary | ICD-10-CM

## 2015-08-22 DIAGNOSIS — I10 Essential (primary) hypertension: Secondary | ICD-10-CM | POA: Diagnosis present

## 2015-08-22 DIAGNOSIS — I480 Paroxysmal atrial fibrillation: Secondary | ICD-10-CM | POA: Diagnosis present

## 2015-08-22 DIAGNOSIS — Z794 Long term (current) use of insulin: Secondary | ICD-10-CM

## 2015-08-22 DIAGNOSIS — I4819 Other persistent atrial fibrillation: Secondary | ICD-10-CM | POA: Diagnosis present

## 2015-08-22 DIAGNOSIS — I25118 Atherosclerotic heart disease of native coronary artery with other forms of angina pectoris: Secondary | ICD-10-CM | POA: Diagnosis not present

## 2015-08-22 DIAGNOSIS — Z888 Allergy status to other drugs, medicaments and biological substances status: Secondary | ICD-10-CM

## 2015-08-22 LAB — BASIC METABOLIC PANEL
ANION GAP: 10 (ref 5–15)
BUN: 10 mg/dL (ref 6–20)
CALCIUM: 9 mg/dL (ref 8.9–10.3)
CO2: 25 mmol/L (ref 22–32)
Chloride: 103 mmol/L (ref 101–111)
Creatinine, Ser: 1.13 mg/dL (ref 0.61–1.24)
Glucose, Bld: 183 mg/dL — ABNORMAL HIGH (ref 65–99)
Potassium: 3.8 mmol/L (ref 3.5–5.1)
Sodium: 138 mmol/L (ref 135–145)

## 2015-08-22 LAB — I-STAT TROPONIN, ED: TROPONIN I, POC: 0.02 ng/mL (ref 0.00–0.08)

## 2015-08-22 LAB — TROPONIN I: Troponin I: 0.03 ng/mL (ref <0.031)

## 2015-08-22 LAB — CBC
HCT: 39.1 % (ref 39.0–52.0)
HEMOGLOBIN: 12.8 g/dL — AB (ref 13.0–17.0)
MCH: 27.5 pg (ref 26.0–34.0)
MCHC: 32.7 g/dL (ref 30.0–36.0)
MCV: 84.1 fL (ref 78.0–100.0)
Platelets: 179 10*3/uL (ref 150–400)
RBC: 4.65 MIL/uL (ref 4.22–5.81)
RDW: 15.2 % (ref 11.5–15.5)
WBC: 6.1 10*3/uL (ref 4.0–10.5)

## 2015-08-22 LAB — GLUCOSE, CAPILLARY: Glucose-Capillary: 138 mg/dL — ABNORMAL HIGH (ref 65–99)

## 2015-08-22 LAB — BRAIN NATRIURETIC PEPTIDE: B NATRIURETIC PEPTIDE 5: 331 pg/mL — AB (ref 0.0–100.0)

## 2015-08-22 LAB — PROTIME-INR
INR: 3.36 — AB (ref 0.00–1.49)
PROTHROMBIN TIME: 33.3 s — AB (ref 11.6–15.2)

## 2015-08-22 MED ORDER — CARVEDILOL 6.25 MG PO TABS
6.2500 mg | ORAL_TABLET | Freq: Two times a day (BID) | ORAL | Status: DC
  Administered 2015-08-23: 6.25 mg via ORAL
  Filled 2015-08-22: qty 1

## 2015-08-22 MED ORDER — INSULIN GLARGINE 100 UNIT/ML ~~LOC~~ SOLN
65.0000 [IU] | SUBCUTANEOUS | Status: DC
  Administered 2015-08-23 – 2015-08-24 (×2): 65 [IU] via SUBCUTANEOUS
  Filled 2015-08-22 (×3): qty 0.65

## 2015-08-22 MED ORDER — CLOPIDOGREL BISULFATE 75 MG PO TABS
75.0000 mg | ORAL_TABLET | Freq: Every day | ORAL | Status: DC
  Administered 2015-08-23 – 2015-08-24 (×2): 75 mg via ORAL
  Filled 2015-08-22 (×2): qty 1

## 2015-08-22 MED ORDER — ISOSORB DINITRATE-HYDRALAZINE 20-37.5 MG PO TABS
1.0000 | ORAL_TABLET | Freq: Two times a day (BID) | ORAL | Status: DC
  Administered 2015-08-22 – 2015-08-24 (×4): 1 via ORAL
  Filled 2015-08-22 (×5): qty 1

## 2015-08-22 MED ORDER — ACETAMINOPHEN 500 MG PO TABS
1000.0000 mg | ORAL_TABLET | Freq: Four times a day (QID) | ORAL | Status: DC | PRN
  Administered 2015-08-23 – 2015-08-24 (×3): 1000 mg via ORAL
  Filled 2015-08-22 (×3): qty 2

## 2015-08-22 MED ORDER — ATORVASTATIN CALCIUM 80 MG PO TABS
80.0000 mg | ORAL_TABLET | Freq: Every day | ORAL | Status: DC
  Administered 2015-08-22 – 2015-08-24 (×3): 80 mg via ORAL
  Filled 2015-08-22 (×3): qty 1

## 2015-08-22 MED ORDER — SPIRONOLACTONE 25 MG PO TABS
25.0000 mg | ORAL_TABLET | Freq: Every day | ORAL | Status: DC
  Administered 2015-08-22 – 2015-08-24 (×3): 25 mg via ORAL
  Filled 2015-08-22 (×3): qty 1

## 2015-08-22 MED ORDER — FUROSEMIDE 10 MG/ML IJ SOLN
40.0000 mg | Freq: Two times a day (BID) | INTRAMUSCULAR | Status: DC
  Administered 2015-08-22 – 2015-08-24 (×4): 40 mg via INTRAVENOUS
  Filled 2015-08-22 (×5): qty 4

## 2015-08-22 MED ORDER — POTASSIUM CHLORIDE CRYS ER 20 MEQ PO TBCR
20.0000 meq | EXTENDED_RELEASE_TABLET | Freq: Two times a day (BID) | ORAL | Status: DC
  Administered 2015-08-22 – 2015-08-24 (×4): 20 meq via ORAL
  Filled 2015-08-22 (×4): qty 1

## 2015-08-22 NOTE — Progress Notes (Addendum)
ANTICOAGULATION CONSULT NOTE - Initial Consult  Pharmacy Consult for Warfarin Indication: atrial fibrillation  Allergies  Allergen Reactions  . Other Hives    Patient reports developing hives after receiving "some antibiotic given in 1980''s at [facility]". He does not know which antibiotic.  [name] Ace Inhibitors Cough    Patient Measurements: Total Body Weight: 98.4kg Ideal Body Weight: 79.9kg  Vital Signs: Temp: 98 F (36.7 C) (01/09 1608) BP: 164/112 mmHg (01/09 1900) Pulse Rate: 77 (01/09 1900)  Labs:  Recent Labs  08/22/15 1609  HGB 12.8*  HCT 39.1  PLT 179  LABPROT 33.3*  INR 3.36*  CREATININE 1.13    Estimated Creatinine Clearance: 84.8 mL/min (by C-G formula based on Cr of 1.13).   Medical History: Past Medical History  Diagnosis Date  . Hypertension   . Gout   . PAF (paroxysmal atrial fibrillation) (HCC)     On coumadin  . LIVER FUNCTION TESTS, ABNORMAL, HX OF 12/30/2006  . Rectal bleeding 12/18/2011    Scheduled for colonoscopy.    [name] HEPATITIS B, CHRONIC 12/30/2006  . HYPERCHOLESTEROLEMIA 07/11/2010  . MITRAL REGURGITATION 12/30/2006  . COLONIC POLYPS, HX OF 12/30/2006  . CAD (coronary artery disease), native coronary artery     a. Nonobstructive CAD by cath 2013 - diffuse distal and branch vessel CAD, no severe disease in the major coronaries, LV mild global hypokinesis, EF 45%. b. ETT-Sestamibi 5/14: EF 31%, small fixed inferior defect with no ischemia.  . Chronic CHF ([place])     a. Mixed ICM/NICM (?EtOH). EF 35% in 2008. Echo 5/13: EF 60-65%, mod LVH, EF 45% on V gram in 12/2011. EF 12/2012: EF 50-55%, mild LVH, inferobasal HK, mild MR. ETT-Ses 5/14 EF 41%. Cardiac MRI 5/14: EF 44%, mild global HK, subepicardial delayed enhancement in nonspecific RV insertion pattern.  . H/O atrial flutter 2007    a. Ablations in 2007, 2008.  [name] History of medication noncompliance   . History of alcohol abuse   . 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 Heart murmur   . Anginal pain ([place])   . Sleep apnea     "suppose to send mask but they never did" (05/03/2015)  . Type II diabetes mellitus ([place])   . Left sciatic nerve pain since 04/2015  . Anxiety   . Cholecystitis     Medications:   (Not in a hospital admission) Scheduled:  . atorvastatin  80 mg Oral Daily  . [START ON 08/23/2015] carvedilol  6.25 mg Oral BID WC  . [START ON 08/23/2015] clopidogrel  75 mg Oral Q breakfast  . [START ON 08/23/2015] Insulin Glargine  65 Units Subcutaneous [id number]  . spironolactone  25 mg Oral Daily   Infusions:    Assessment: 60yo male w/ hx of HTN, DM, PAF on coumadin, CAD w/ distal dz plus DES x2 RCA 09/2014, CKD, nl EF w/ diastolic dysf by echo Q000111Q. Admitted 05/2105 w/ cholecystitis, s/p cholecystostomy tube. Has become more SOB since 4 days ago. Presents w/ PND, orthopnea, increased DOE. Pharmacy consulted to dose warfarin for Afib. BNP 331, sCr 1.13, Plts 179, Hgb 12.8, INR 3.36 which is above goal.  PTA warfarin dose: 5mg  daily except 2.5mg  on Tues/Sat - missed dose 1/8, took dose this morning  Goal of Therapy:  INR 2-2.5 while on plavix (per Dr. Claris Gladden progress note 07/28/15) Monitor platelets by anticoagulation protocol: Yes   Plan:  Hold dose tonight Follow up daily INR, H&H, Plts, treatment course  Georgiann Mohs 08/22/2015,8:14 PM  I agree with the above assessment and plan.  Nena Jordan, PharmD, BCPS 08/22/2015, 9:24 PM

## 2015-08-22 NOTE — ED Notes (Signed)
Pt here for SOB and chest pain that started 3 days ago. sts worsening. sts under ribs. Denies chest pain today just SOB.

## 2015-08-22 NOTE — Telephone Encounter (Signed)
Pt c/o Shortness Of Breath: STAT if SOB developed within the last 24 hours or pt is noticeably SOB on the phone  1. Are you currently SOB (can you hear that pt is SOB on the phone)? Yes, pt's daughter is calling and said pt having SOB  2. How long have you been experiencing SOB? Don't know  3. Are you SOB when sitting or when up moving around? Don't Know  4. Are you currently experiencing any other symptoms? No he didn't say stated his daughter.

## 2015-08-22 NOTE — Telephone Encounter (Signed)
I spoke with patient's daughter, Jeanette Caprice. Jeanette Caprice states that pt called her this morning complaining of shortness of breath, stating he needed to be seen by doctor. Jeanette Caprice states that pt sounded short of breath over the telephone, pt denies chest pain. Jeanette Caprice states pt said he had been increasingly short of breath the last few days. Jeanette Caprice states it is unusual for pt to call her and complain about symptoms.  Jeanette Caprice advised pt should not drive and report to [facility] ED for medical care now.  Jeanette Caprice states that she will talk with pt and call me back if pt declines to report to [facility] ED.

## 2015-08-22 NOTE — Telephone Encounter (Signed)
Per daughter Jeanette Caprice, patient is going to [facility] ED for evaluation.

## 2015-08-22 NOTE — ED Notes (Signed)
Cardiology PA at bedside. 

## 2015-08-22 NOTE — H&P (Signed)
History and Physical   Patient ID: [name] [name] MRN: [id number], DOB/AGE: Aug 22, 1955 60 y.o. Date of Encounter: 08/22/2015  Primary Physician: Angelica Chessman, MD Primary Cardiologist: Dr Aundra Dubin  Chief Complaint:  Chest pain and SOB  HPI: [name] [name] is a 60 y.o. male with a history of HTN, DM, PAF on coumadin, CAD w/ distal dz plus DES x 2 RCA 09/2014, CKD, nl EF w/ diastolic dysf by echo Q000111Q. Admitted 05/2015 w/ cholecystitis, s/p cholecystostomy tube. BP low at that time, taken off amlodipine, Coreg, Laisx, hydralazine, or Imdur. Needs cholecystectomy, but plan is to wait until he can be off his Plavix. Either 6 or 12 months. Pt has OSA, but has not completed eval yet so not on CPAP  Pt was fine 4 days ago. Since then, he has had steadily become more SOB. He has not weighed himself. No LE edema. He describes PND, orthopnea, increased DOE. Last pm, he had chest pain. It started at rest. No rx tried. It reached a 5/10, he was SOB when it started, no nausea, mild diaphoresis. Pain lasted about 5 hours. It seemed to be under his rib cage, previous chest pain was higher in his chest.   Only other recent symptoms has been HA last pm, as well as sinus congestion. Pt has 3-4 episodes of diarrhea after every meal since getting the GB drain, non-bloody. No fevers or chills. No UTI sx.   Past Medical History  Diagnosis Date  . Hypertension   . Gout   . PAF (paroxysmal atrial fibrillation) (HCC)     On coumadin  . LIVER FUNCTION TESTS, ABNORMAL, HX OF 12/30/2006  . Rectal bleeding 12/18/2011    Scheduled for colonoscopy.    [name] HEPATITIS B, CHRONIC 12/30/2006  . HYPERCHOLESTEROLEMIA 07/11/2010  . MITRAL REGURGITATION 12/30/2006  . COLONIC POLYPS, HX OF 12/30/2006  . CAD (coronary artery disease), native coronary artery     a. Nonobstructive CAD by cath 2013 - diffuse distal and branch vessel CAD, no severe disease in the major coronaries, LV mild global hypokinesis, EF 45%. b.  ETT-Sestamibi 5/14: EF 31%, small fixed inferior defect with no ischemia.  . Chronic CHF ([place])     a. Mixed ICM/NICM (?EtOH). EF 35% in 2008. Echo 5/13: EF 60-65%, mod LVH, EF 45% on V gram in 12/2011. EF 12/2012: EF 50-55%, mild LVH, inferobasal HK, mild MR. ETT-Ses 5/14 EF 41%. Cardiac MRI 5/14: EF 44%, mild global HK, subepicardial delayed enhancement in nonspecific RV insertion pattern.  . H/O atrial flutter 2007    a. Ablations in 2007, 2008.  [name] History of medication noncompliance   . History of alcohol abuse   . 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 Heart murmur   . Anginal pain ([place])   . Sleep apnea     "suppose to send mask but they never did" (05/03/2015)  . Type II diabetes mellitus ([place])   . Left sciatic nerve pain since 04/2015  . Anxiety   . Cholecystitis     Surgical History:  Past Surgical History  Procedure Laterality Date  . Osteochondroma excision Left 1972    "took bone tumor off my shoulder"  . Coronary angioplasty  12/05/01  . A flutter ablation  2007, 2008    catheter ablation   . Left heart catheterization with coronary angiogram N/A 12/19/2011    Procedure: LEFT HEART CATHETERIZATION WITH CORONARY ANGIOGRAM;  Surgeon: Larey Dresser, MD;  Location: [facility] CATH LAB;  Service: Cardiovascular;  Laterality: N/A;  . Cardiac catheterization N/A 05/03/2015    Procedure: Right/Left Heart Cath and Coronary Angiography;  Surgeon: Larey Dresser, MD;  Location: [facility] CV LAB;  Service: Cardiovascular;  Laterality: N/A;  . Cardiac catheterization N/A 05/03/2015    Procedure: Coronary Stent Intervention;  Surgeon: Jettie Booze, MD;  Location: [place] CV LAB;  Service: Cardiovascular;  Laterality: N/A;     I have reviewed the patient's current medications. Medication Sig  acetaminophen (TYLENOL) 500 MG tablet Take 1,000 mg by mouth every 6 (six) hours as needed (pain). Reported on 08/18/2015  amLODipine (NORVASC) 10 MG tablet Take 1 tablet (10 mg  total) by mouth daily. Patient not taking: Reported on 08/18/2015  atorvastatin (LIPITOR) 80 MG tablet Take 1 tablet (80 mg total) by mouth daily.  carvedilol (COREG) 25 MG tablet Take 1 tablet (25 mg total) by mouth 2 (two) times daily with a meal.  clopidogrel (PLAVIX) 75 MG tablet Take 1 tablet (75 mg total) by mouth daily with breakfast.  Insulin Glargine (LANTUS SOLOSTAR) 100 UNIT/ML Solostar Pen Inject 60 Units into the skin every morning. And pen needles 1/day  metFORMIN (GLUCOPHAGE) 1000 MG tablet Take 1 tablet (1,000 mg total) by mouth 2 (two) times daily with a meal.  nitroGLYCERIN (NITROSTAT) 0.4 MG SL tablet Place 0.4 mg under the tongue every 5 (five) minutes as needed for chest pain (x 3 tabs daily). Reported on 08/18/2015  spironolactone (ALDACTONE) 25 MG tablet Take 1 tablet (25 mg total) by mouth daily.  warfarin (COUMADIN) 5 MG tablet Take 1 tablet (5 mg total) by mouth as directed. Patient taking differently: Take 2.5-5 mg by mouth at bedtime. Take 1/2 tablet (2.5 mg) by mouth on Wednesday, take 1 tablet (5 mg) on Sunday, Monday, Tuesday, Thursday, Friday, Saturday   Scheduled Meds: Continuous Infusions: PRN Meds:.  Allergies:  Allergies  Allergen Reactions  . Other Hives    Patient reports developing hives after receiving "some antibiotic given in 1980''s at [facility]". He does not know which antibiotic.  [name] Ace Inhibitors Cough    Social History   Social History  . Marital Status: Widowed    Spouse Name: N/A  . Number of Children: 2  . Years of Education: N/A   Occupational History  .     Social History Main Topics  . Smoking status: Former Smoker -- 4.00 packs/day for 18 years    Types: Cigarettes    Quit date: 08/14/1983  . Smokeless tobacco: Never Used  . Alcohol Use: 9.6 oz/week    6 Cans of beer, 10 Shots of liquor per week     Comment: 05/03/2015 "1-2 beers q 3 days; 4-5 shots of liquor twice/wk"  . Drug Use: Yes    Special: Marijuana      Comment: 05/03/2015 "twice/wk maybe"  . Sexual Activity: No   Other Topics Concern  . Not on file   Social History Narrative    Family History  Problem Relation Age of Onset  . Diabetes Mother   . Hypertension Mother   . Heart attack Neg Hx   . Stroke Neg Hx    Family Status  Relation Status Death Age  . Mother Alive   . Father Deceased     Review of Systems:   Full 14-point review of systems otherwise negative except as noted above.  Physical Exam: Blood pressure 164/112, pulse 77, temperature 98 F (36.7 C), resp. rate 16, SpO2 99 %.  General: Well developed, well nourished,male in no acute distress. Head: Normocephalic, atraumatic, sclera non-icteric, no xanthomas, nares are without discharge. Dentition: poor Neck: No carotid bruits. JVD10 cm. No thyromegally Lungs: Good expansion bilaterally. without wheezes or rhonchi. Bibasilar rales Heart: IRRegular rate and rhythm with S1 S2. High-pitched murmur, no rubs, or gallops appreciated. Abdomen: Soft, non-tender, non-distended with normoactive bowel sounds. No hepatomegaly. No rebound/guarding. No obvious abdominal masses. Msk:  Strength and tone appear normal for age. No joint deformities or effusions, no spine or costo-vertebral angle tenderness. Extremities: No clubbing or cyanosis. No edema.  Distal pedal pulses are 2+ in 4 extrem Neuro: Alert and oriented X 3. Moves all extremities spontaneously. No focal deficits noted. Psych:  Responds to questions appropriately with a normal affect. Skin: No rashes or lesions noted  Labs:   Lab Results  Component Value Date   WBC 6.1 08/22/2015   HGB 12.8* 08/22/2015   HCT 39.1 08/22/2015   MCV 84.1 08/22/2015   PLT 179 08/22/2015    Recent Labs  08/22/15 1609  INR 3.36*     Recent Labs Lab 08/22/15 1609  NA 138  K 3.8  CL 103  CO2 25  BUN 10  CREATININE 1.13  CALCIUM 9.0  GLUCOSE 183*    Recent Labs  08/22/15 1627  TROPIPOC 0.02   Lab Results  Component  Value Date   CHOL 104* 07/12/2015   HDL 44 07/12/2015   LDLCALC 44 07/12/2015   TRIG 78 07/12/2015    Radiology/Studies: Dg Chest 2 View 08/22/2015  CLINICAL DATA:  Shortness of breath. Centralized chest pressure/chest pain for 1 day. Atrial fibrillation. EXAM: CHEST  2 VIEW COMPARISON:  06/04/2015 FINDINGS: Stable mild enlargement of the cardiopericardial silhouette without edema. Faint chronic interstitial accentuation at the lung bases. Subtle thickening of the fissures on the lateral projection ; blurring of the lateral view due to motion. No definite pleural effusion. Mild thoracic spondylosis. IMPRESSION: 1. Mild chronic interstitial accentuation particularly in the lung bases, nonspecific. No acute findings. 2. Stable mild enlargement of the cardiopericardial silhouette. Electronically Signed   By: Van Clines M.D.   On: 08/22/2015 16:39     Cardiac Cath: 04/2015 R/L [facility] 05/03/15 LM: Okay LAD: Ostial 40%, mid 40-50%, distal 50% RI: Small branch with Ostial 90%, moderate size second branch ostial 90%, proximal and mid 40% in main RI LCx: Small, OM1 50% mid RCA: Mid 40-50%, distal 95% EF 55-60% RA mean 8, RV 36/80, PA 39/20, mean 28, PCWP mean 14 PCI: 2.5 x 16 Synergy DES to the distal RCA, 3 x 12 Synergy DES to the proximal RCA  Echo: 9/16 Mild LVH, EF 55-60%, normal wall motion, grade 2 diastolic dysfunction, trivial AI, mild MR, moderate LAE, normal RVSF, mild TR  ECG: 08/22/2015 SR, lateral T wave changes are old (though not seen on 07/28/2015 ECG), inferior ST/T wave flattening is slightly different    ASSESSMENT AND PLAN:   Principal Problem:   Acute on chronic diastolic CHF (congestive heart failure), NYHA class 3 (HCC) - admit, diurese - follow renal function on IV Lasix  Active Problems:   Atrial flutter, controlled ventricular response  - not currently on rate-control meds - restart Coreg at 6.25 mg bid (instead of 25 mg) - may need DCCV as this may be  contributing to CHF    Diabetes ([place]) - continue Lantus, hold metformin - SSI    HYPERCHOLESTEROLEMIA - continue statin, ck profile    Coronary artery disease - chest  pain last pm - cycle enzymes - no heparin since INR > 2.0    HTN (hypertension) - restart hydralazine, nitrates (Bidil added) and Lasix    OSA (obstructive sleep apnea) - use CPAP while here - complete workup as OP    Chronic anticoagulation - continue coumadin as he may need DCCV  Signed, Lenoard Aden 08/22/2015 7:42 PM Beeper [id number]   The patient was seen, examined and discussed with Rosaria Ferries, PA-C and I agree with the above.   59-yo-with a history of HTN, DM, PAF on coumadin, CAD w/ distal dz plus DES x 2 RCA 09/2014, nl EF w/ diastolic dysf by echo Q000111Q. Admitted 05/2015 w/ cholecystitis, s/p cholecystostomy tube, BP low at that time, taken off amlodipine, Coreg, Lasix, hydralazine, or Imdur. Needs cholecystectomy, but plan is to wait until he can be off his Plavix. Either 6 or 12 months.  He is followed by Dr Aundra Dubin, the last seen on 07/28/2015 when he appeared euvolemic. He developed symptoms of SOB, PND, orthopnea 4 days ago.  On physical exam no LE edema,  But crackles at both bases and elevated JVD. He is hypertensive.  Weight bellow his baseline, today 211 lbs, in Dec 214 lbs, he attributes it to weight loss sec to diarrhea post cholecystotomy tube and diet non-compliance. He is found to be in atrial flutter with variable block.  Plan: We will restart lasix, start with 40 mg iv BID, restart hydralazine/imdur. For atrial flutter restart carvedilol 6.25 mg po BID. He is on Warfarin, INR 3.6, he should undergo DCCV tomorrow or Wednesday.   Dorothy Spark 08/22/2015

## 2015-08-22 NOTE — ED Provider Notes (Signed)
CSN: [id number]     Arrival date & time 08/22/15  1558 History   First MD Initiated Contact with Patient 08/22/15 1736     Chief Complaint  Patient presents with  . Chest Pain  . Shortness of Breath    HPI   [name] [name] is a 60 y.o. male with a PMH of HTN, HLD, DM, CAD, CHF, paroxysmal atrial fibrillation on coumadin who presents to the ED with shortness of breath x 3 days and chest pain, which started last night. He reports his symptoms are constant. He states activity exacerbates his symptoms and resting alleviates his symptoms. He reports with the onset of his shortness of breath, he felt like he felt when he was in atrial fibrillation prior to undergoing cardioversion. He denies fever, chills, cough, congestion, dizziness, lightheadedness, abdominal pain, N/V, numbness, weakness, paresthesia. He states his cholecystostomy tube has been draining appropriately.   Past Medical History  Diagnosis Date  . Hypertension   . Gout   . PAF (paroxysmal atrial fibrillation) (HCC)     On coumadin  . LIVER FUNCTION TESTS, ABNORMAL, HX OF 12/30/2006  . Rectal bleeding 12/18/2011    Scheduled for colonoscopy.    [name] HEPATITIS B, CHRONIC 12/30/2006  . HYPERCHOLESTEROLEMIA 07/11/2010  . MITRAL REGURGITATION 12/30/2006  . COLONIC POLYPS, HX OF 12/30/2006  . CAD (coronary artery disease), native coronary artery     a. Nonobstructive CAD by cath 2013 - diffuse distal and branch vessel CAD, no severe disease in the major coronaries, LV mild global hypokinesis, EF 45%. b. ETT-Sestamibi 5/14: EF 31%, small fixed inferior defect with no ischemia.  . Chronic CHF ([place])     a. Mixed ICM/NICM (?EtOH). EF 35% in 2008. Echo 5/13: EF 60-65%, mod LVH, EF 45% on V gram in 12/2011. EF 12/2012: EF 50-55%, mild LVH, inferobasal HK, mild MR. ETT-Ses 5/14 EF 41%. Cardiac MRI 5/14: EF 44%, mild global HK, subepicardial delayed enhancement in nonspecific RV insertion pattern.  . H/O atrial flutter 2007    a. Ablations in 2007,  2008.  [name] History of medication noncompliance   . History of alcohol abuse   . 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 Heart murmur   . Anginal pain ([place])   . Sleep apnea     "suppose to send mask but they never did" (05/03/2015)  . Type II diabetes mellitus ([place])   . Left sciatic nerve pain since 04/2015  . Anxiety   . Cholecystitis    Past Surgical History  Procedure Laterality Date  . Osteochondroma excision Left 1972    "took bone tumor off my shoulder"  . Coronary angioplasty  12/05/01  . A flutter ablation  2007, 2008    catheter ablation   . Left heart catheterization with coronary angiogram N/A 12/19/2011    Procedure: LEFT HEART CATHETERIZATION WITH CORONARY ANGIOGRAM;  Surgeon: Larey Dresser, MD;  Location: [facility] CATH LAB;  Service: Cardiovascular;  Laterality: N/A;  . Cardiac catheterization N/A 05/03/2015    Procedure: Right/Left Heart Cath and Coronary Angiography;  Surgeon: Larey Dresser, MD;  Location: [facility] CV LAB;  Service: Cardiovascular;  Laterality: N/A;  . Cardiac catheterization N/A 05/03/2015    Procedure: Coronary Stent Intervention;  Surgeon: Jettie Booze, MD;  Location: [facility] CV LAB;  Service: Cardiovascular;  Laterality: N/A;   Family History  Problem Relation Age of Onset  . Diabetes Mother   . Hypertension Mother   . Heart attack Neg  Hx   . Stroke Neg Hx    Social History  Substance Use Topics  . Smoking status: Former Smoker -- 4.00 packs/day for 18 years    Types: Cigarettes    Quit date: 08/14/1983  . Smokeless tobacco: Never Used  . Alcohol Use: 9.6 oz/week    6 Cans of beer, 10 Shots of liquor per week     Comment: 05/03/2015 "1-2 beers q 3 days; 4-5 shots of liquor twice/wk"      Review of Systems  Constitutional: Negative for fever and chills.  Respiratory: Positive for shortness of breath. Negative for cough.   Cardiovascular: Positive for chest pain and palpitations. Negative for leg swelling.   Gastrointestinal: Negative for nausea, vomiting, abdominal pain, diarrhea and constipation.  Neurological: Negative for dizziness, syncope, weakness, light-headedness and headaches.  All other systems reviewed and are negative.     Allergies  Other and Ace inhibitors  Home Medications   Prior to Admission medications   Medication Sig Start Date End Date Taking? Authorizing Provider  acetaminophen (TYLENOL) 500 MG tablet Take 1,000 mg by mouth every 6 (six) hours as needed (pain). Reported on 08/18/2015   Yes Historical Provider, MD  amLODipine (NORVASC) 10 MG tablet Take 1 tablet (10 mg total) by mouth daily. 07/28/15  Yes Larey Dresser, MD  atorvastatin (LIPITOR) 80 MG tablet Take 1 tablet (80 mg total) by mouth daily. 04/29/15  Yes Larey Dresser, MD  carvedilol (COREG) 25 MG tablet Take 1 tablet (25 mg total) by mouth 2 (two) times daily with a meal. 07/28/15  Yes Larey Dresser, MD  clopidogrel (PLAVIX) 75 MG tablet Take 1 tablet (75 mg total) by mouth daily with breakfast. 07/28/15  Yes Larey Dresser, MD  Insulin Glargine (LANTUS SOLOSTAR) 100 UNIT/ML Solostar Pen Inject 60 Units into the skin every morning. And pen needles 1/day Patient taking differently: Inject 65 Units into the skin every morning. And pen needles 1/day 08/04/15  Yes Renato Shin, MD  metFORMIN (GLUCOPHAGE) 1000 MG tablet Take 1 tablet (1,000 mg total) by mouth 2 (two) times daily with a meal. 07/22/15  Yes Tresa Garter, MD  nitroGLYCERIN (NITROSTAT) 0.4 MG SL tablet Place 0.4 mg under the tongue every 5 (five) minutes as needed for chest pain (x 3 tabs daily). Reported on 08/18/2015   Yes Historical Provider, MD  spironolactone (ALDACTONE) 25 MG tablet Take 1 tablet (25 mg total) by mouth daily. 07/28/15  Yes Larey Dresser, MD  warfarin (COUMADIN) 5 MG tablet Take 1 tablet (5 mg total) by mouth as directed. Patient taking differently: Take 2.5-5 mg by mouth at bedtime. Take 1/2 tablet (2.5 mg) on Tues and  Sat  Takes 1 tablet all other days 05/18/14  Yes Olugbemiga E Jegede, MD    BP 164/112 mmHg  Pulse 77  Temp(Src) 98 F (36.7 C)  Resp 16  SpO2 99% Physical Exam  Constitutional: He is oriented to person, place, and time. He appears well-developed and well-nourished. No distress.  HENT:  Head: Normocephalic and atraumatic.  Right Ear: External ear normal.  Left Ear: External ear normal.  Nose: Nose normal.  Mouth/Throat: Uvula is midline, oropharynx is clear and moist and mucous membranes are normal.  Eyes: Conjunctivae, EOM and lids are normal. Pupils are equal, round, and reactive to light. Right eye exhibits no discharge. Left eye exhibits no discharge. No scleral icterus.  Neck: Normal range of motion. Neck supple.  Cardiovascular: Normal rate, regular rhythm, normal  heart sounds, intact distal pulses and normal pulses.   Pulmonary/Chest: Effort normal and breath sounds normal. No respiratory distress. He has no wheezes. He has no rales.  Abdominal: Soft. Normal appearance and bowel sounds are normal. He exhibits no distension and no mass. There is no tenderness. There is no rigidity, no rebound and no guarding.  Musculoskeletal: Normal range of motion. He exhibits no edema or tenderness.  Neurological: He is alert and oriented to person, place, and time. He has normal strength. No cranial nerve deficit or sensory deficit.  Skin: Skin is warm, dry and intact. No rash noted. He is not diaphoretic. No erythema. No pallor.  Psychiatric: He has a normal mood and affect. His speech is normal and behavior is normal.  Nursing note and vitals reviewed.   ED Course  Procedures (including critical care time)  Labs Review Labs Reviewed  BASIC METABOLIC PANEL - Abnormal; Notable for the following:    Glucose, Bld 183 (*)    All other components within normal limits  CBC - Abnormal; Notable for the following:    Hemoglobin 12.8 (*)    All other components within normal limits   PROTIME-INR - Abnormal; Notable for the following:    Prothrombin Time 33.3 (*)    INR 3.36 (*)    All other components within normal limits  BRAIN NATRIURETIC PEPTIDE - Abnormal; Notable for the following:    B Natriuretic Peptide 331.0 (*)    All other components within normal limits  TROPONIN I  TROPONIN I  TROPONIN I  I-STAT TROPOININ, ED    Imaging Review Dg Chest 2 View  08/22/2015  CLINICAL DATA:  Shortness of breath. Centralized chest pressure/chest pain for 1 day. Atrial fibrillation. EXAM: CHEST  2 VIEW COMPARISON:  06/04/2015 FINDINGS: Stable mild enlargement of the cardiopericardial silhouette without edema. Faint chronic interstitial accentuation at the lung bases. Subtle thickening of the fissures on the lateral projection ; blurring of the lateral view due to motion. No definite pleural effusion. Mild thoracic spondylosis. IMPRESSION: 1. Mild chronic interstitial accentuation particularly in the lung bases, nonspecific. No acute findings. 2. Stable mild enlargement of the cardiopericardial silhouette. Electronically Signed   By: Van Clines M.D.   On: 08/22/2015 16:39   I have personally reviewed and evaluated these images and lab results as part of my medical decision-making.   EKG Interpretation   Date/Time:  Monday August 22 2015 16:00:56 EST Ventricular Rate:  78 PR Interval:    QRS Duration: 88 QT Interval:  384 QTC Calculation: 437 R Axis:   59 Text Interpretation:  Atrial flutter with 4:1 A-V conduction Left  ventricular hypertrophy with repolarization abnormality Abnormal ECG no  significant change since Oct 2016 Confirmed by Regenia Skeeter  MD, SCOTT ([id number])  on 08/22/2015 6:21:19 PM      MDM   Final diagnoses:  Chest pain, unspecified chest pain type  Shortness of breath  Atrial flutter, unspecified type [facility])    60 year old male with extensive cardiac history presents with shortness of breath and chest pain. States his symptoms feel similar to  when he was in atrial fibrillation. Per record review, patient had cardiac cath 04/2015, which revealed 40-50% mLAD, diffuse up to 50% distal LAD, 90% ostium of branch off ramus, 95% dRCA and underwent DES to RCA x 2. He was last admitted 10/22-10/27 for acute cholecystitis, at which time had a cholecystostomy tube placed.   Patient is afebrile. Hypertensive. Heart RRR. Lungs clear to auscultation bilaterally.  No significant lower extremity edema.   EKG atrial flutter, HR 78. Troponin negative. BNP 331. CBC negative for leukocytosis, hemoglobin 12.8. BMP unremarkable. INR 3.3. Chest x-ray remarkable for mild chronic interstitial accentuation particularly in the lung bases, stable mild enlargement of the cardiac pericardial silhouette, no acute findings.  Cardiology consulted. Spoke with Dr. Meda Coffee - cardiology will see the patient in the ED. Patient to be admitted for further evaluation and management.  Patient discussed with and seen by Dr. Regenia Skeeter.  BP 164/112 mmHg  Pulse 77  Temp(Src) 98 F (36.7 C)  Resp 16  SpO2 99%    Marella Chimes, PA-C 08/23/15 [id number]  Sherwood Gambler, MD 08/23/15 1556

## 2015-08-23 ENCOUNTER — Encounter: Payer: Self-pay | Admitting: *Deleted

## 2015-08-23 DIAGNOSIS — Z006 Encounter for examination for normal comparison and control in clinical research program: Secondary | ICD-10-CM

## 2015-08-23 DIAGNOSIS — E7800 Pure hypercholesterolemia, unspecified: Secondary | ICD-10-CM

## 2015-08-23 DIAGNOSIS — G4733 Obstructive sleep apnea (adult) (pediatric): Secondary | ICD-10-CM

## 2015-08-23 DIAGNOSIS — I25118 Atherosclerotic heart disease of native coronary artery with other forms of angina pectoris: Secondary | ICD-10-CM

## 2015-08-23 DIAGNOSIS — I110 Hypertensive heart disease with heart failure: Secondary | ICD-10-CM

## 2015-08-23 LAB — BASIC METABOLIC PANEL
ANION GAP: 10 (ref 5–15)
Anion gap: 12 (ref 5–15)
BUN: 11 mg/dL (ref 6–20)
BUN: 11 mg/dL (ref 6–20)
CALCIUM: 9.1 mg/dL (ref 8.9–10.3)
CALCIUM: 9.3 mg/dL (ref 8.9–10.3)
CHLORIDE: 104 mmol/L (ref 101–111)
CHLORIDE: 99 mmol/L — AB (ref 101–111)
CO2: 25 mmol/L (ref 22–32)
CO2: 28 mmol/L (ref 22–32)
CREATININE: 1.27 mg/dL — AB (ref 0.61–1.24)
Creatinine, Ser: 1.04 mg/dL (ref 0.61–1.24)
GFR calc Af Amer: >60 mL/min (ref >60)
GFR calc non Af Amer: >60 mL/min (ref >60)
GFR calc non Af Amer: 59 mL/min — ABNORMAL LOW (ref >60)
GLUCOSE: 116 mg/dL — AB (ref 65–99)
Glucose, Bld: 193 mg/dL — ABNORMAL HIGH (ref 65–99)
Potassium: 3.8 mmol/L (ref 3.5–5.1)
Potassium: 3.8 mmol/L (ref 3.5–5.1)
Sodium: 139 mmol/L (ref 135–145)
Sodium: 139 mmol/L (ref 135–145)

## 2015-08-23 LAB — PROTIME-INR
INR: 3.09 — ABNORMAL HIGH (ref 0.00–1.49)
INR: 2.43 — ABNORMAL HIGH (ref 0.00–1.49)
PROTHROMBIN TIME: 26.1 s — AB (ref 11.6–15.2)
Prothrombin Time: 31.3 seconds — ABNORMAL HIGH (ref 11.6–15.2)

## 2015-08-23 LAB — TROPONIN I
Troponin I: 0.03 ng/mL (ref <0.031)
Troponin I: <0.03 ng/mL (ref <0.031)

## 2015-08-23 LAB — GLUCOSE, CAPILLARY
Glucose-Capillary: 115 mg/dL — ABNORMAL HIGH (ref 65–99)
Glucose-Capillary: 146 mg/dL — ABNORMAL HIGH (ref 65–99)

## 2015-08-23 MED ORDER — WARFARIN - PHARMACIST DOSING INPATIENT: Freq: Every day | Status: DC

## 2015-08-23 MED ORDER — SODIUM CHLORIDE 0.9 % IJ SOLN: 3.0000 mL | INTRAMUSCULAR | Status: DC | PRN

## 2015-08-23 MED ORDER — SODIUM CHLORIDE 0.9 % IV SOLN: 250.0000 mL | INTRAVENOUS | Status: DC

## 2015-08-23 MED ORDER — WARFARIN SODIUM 1 MG PO TABS
1.0000 mg | ORAL_TABLET | Freq: Once | ORAL | Status: DC
  Filled 2015-08-23: qty 0.5

## 2015-08-23 MED ORDER — SODIUM CHLORIDE 0.9 % IJ SOLN
3.0000 mL | Freq: Two times a day (BID) | INTRAMUSCULAR | Status: DC
  Administered 2015-08-24 (×2): 3 mL via INTRAVENOUS

## 2015-08-23 MED ORDER — CARVEDILOL 12.5 MG PO TABS
12.5000 mg | ORAL_TABLET | Freq: Two times a day (BID) | ORAL | Status: DC
  Administered 2015-08-23 – 2015-08-24 (×3): 12.5 mg via ORAL
  Filled 2015-08-23 (×3): qty 1

## 2015-08-23 NOTE — Progress Notes (Signed)
Utilization review complete. Catalina Salasar RN CCM Case Mgmt phone [phone] 

## 2015-08-23 NOTE — Progress Notes (Signed)
REDS'@Discharge'  Study Informed Consent   Subject Name: [name] [name]  Subject met inclusion and exclusion criteria. The informed consent form, study requirements and expectations were reviewed with the subject and questions and concerns were addressed prior to the signing of the consent form. The subject verbalized understanding of the trail requirements. The subject agreed to participate in the Reds'@Discharge'  trial and signed the informed consent. The informed consent was obtained prior to performance of any protocol-specific procedures for the subject. A copy of the signed informed consent was given to the subject and a copy was placed in the subject's medical record.  Jake Bathe, RN 08/22/2014 12:40

## 2015-08-23 NOTE — Progress Notes (Signed)
Heart Failure Navigator Consult Note  Presentation: [name] [name] is a 60 y.o. male with a history of HTN, DM, PAF on coumadin, CAD w/ distal dz plus DES x 2 RCA 09/2014, CKD, nl EF w/ diastolic dysf by echo [id number]. Admitted 05/2015 w/ cholecystitis, s/p cholecystostomy tube. BP low at that time, taken off amlodipine, Coreg, Laisx, hydralazine, or Imdur. Needs cholecystectomy, but plan is to wait until he can be off his Plavix. Either 6 or 12 months. Pt has OSA, but has not completed eval yet so not on CPAP  Pt was fine 4 days ago. Since then, he has had steadily become more SOB. He has not weighed himself. No LE edema. He describes PND, orthopnea, increased DOE. Last pm, he had chest pain. It started at rest. No rx tried. It reached a 5/10, he was SOB when it started, no nausea, mild diaphoresis. Pain lasted about 5 hours. It seemed to be under his rib cage, previous chest pain was higher in his chest.   Past Medical History  Diagnosis Date  . Hypertension   . Gout   . PAF (paroxysmal atrial fibrillation) (HCC)     On coumadin  . LIVER FUNCTION TESTS, ABNORMAL, HX OF 12/30/2006  . Rectal bleeding 12/18/2011    Scheduled for colonoscopy.    [name] HEPATITIS B, CHRONIC 12/30/2006  . HYPERCHOLESTEROLEMIA 07/11/2010  . MITRAL REGURGITATION 12/30/2006  . COLONIC POLYPS, HX OF 12/30/2006  . CAD (coronary artery disease), native coronary artery     a. Nonobstructive CAD by cath 2013 - diffuse distal and branch vessel CAD, no severe disease in the major coronaries, LV mild global hypokinesis, EF 45%. b. ETT-Sestamibi 5/14: EF 31%, small fixed inferior defect with no ischemia.  . Chronic CHF ([place])     a. Mixed ICM/NICM (?EtOH). EF 35% in 2008. Echo 5/13: EF 60-65%, mod LVH, EF 45% on V gram in 12/2011. EF 12/2012: EF 50-55%, mild LVH, inferobasal HK, mild MR. ETT-Ses 5/14 EF 41%. Cardiac MRI 5/14: EF 44%, mild global HK, subepicardial delayed enhancement in nonspecific RV insertion pattern.  . H/O atrial  flutter 2007    a. Ablations in 2007, 2008.  [name] History of medication noncompliance   . History of alcohol abuse   . 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 Heart murmur   . Anginal pain ([place])   . Sleep apnea     "suppose to send mask but they never did" (05/03/2015)  . Type II diabetes mellitus ([place])   . Left sciatic nerve pain since 04/2015  . Anxiety   . Cholecystitis     Social History   Social History  . Marital Status: Widowed    Spouse Name: N/A  . Number of Children: 2  . Years of Education: N/A   Occupational History  .     Social History Main Topics  . Smoking status: Former Smoker -- 4.00 packs/day for 18 years    Types: Cigarettes    Quit date: 08/14/1983  . Smokeless tobacco: Never Used  . Alcohol Use: 9.6 oz/week    6 Cans of beer, 10 Shots of liquor per week     Comment: 05/03/2015 "1-2 beers q 3 days; 4-5 shots of liquor twice/wk"  . Drug Use: Yes    Special: Marijuana     Comment: 05/03/2015 "twice/wk maybe"  . Sexual Activity: No   Other Topics Concern  . None   Social History Narrative    ECHO:Study Conclusions--05/02/15  -  Left ventricle: The cavity size was normal. There was mild concentric hypertrophy. Systolic function was normal. The estimated ejection fraction was in the range of 55% to 60%. Wall motion was normal; there were no regional wall motion abnormalities. Features are consistent with a pseudonormal left ventricular filling pattern, with concomitant abnormal relaxation and increased filling pressure (grade 2 diastolic dysfunction). Doppler parameters are consistent with elevated ventricular end-diastolic filling pressure. - Aortic valve: Trileaflet; normal thickness leaflets. There was trivial regurgitation. - Aortic root: The aortic root was normal in size. - Mitral valve: Mildly thickened leaflets . There was mild regurgitation. - Left atrium: The atrium was moderately dilated. - Right  ventricle: The cavity size was normal. Wall thickness was normal. Systolic function was normal. - Right atrium: The atrium was normal in size. - Tricuspid valve: There was mild regurgitation. - Pulmonic valve: There was no regurgitation. - Pulmonary arteries: Systolic pressure was within the normal range. - Inferior vena cava: The vessel was normal in size. - Pericardium, extracardiac: There was no pericardial effusion.  Impressions:  - There is no significant difference when compared to the prior study from 09/29/2014.  BNP    Component Value Date/Time   BNP 331.0* 08/22/2015 1609    ProBNP    Component Value Date/Time   PROBNP 59.0 04/22/2015 1559     Education Assessment and Provision:  Detailed education and instructions provided on heart failure disease management including the following:  Signs and symptoms of Heart Failure When to call the physician Importance of daily weights Low sodium diet Fluid restriction Medication management Anticipated future follow-up appointments  Patient education given on each of the above topics.  Patient acknowledges understanding and acceptance of all instructions.  I spoke with Mr. Seaberg and his daughter about his HF.  He says that he has been educated before regarding HF and HF recommendations for home.  He does not have a working scale and therefore has not been weighing himself at home.  I will provide a scale for home use.  We discussed the importance of daily weights and how weight gains relate to the signs and symptoms of HF.  He admits that he rarely has swelling in his legs --mostly just abdominal swelling.  He tells me that he does not use salt and I reviewed foods high in sodium to avoid and a low sodium diet.  He says that he was "taken off of Lasix and several other meds months ago" by a physician (of note per chart due to hypotension).  He follows with [facility].   Education Materials:  "Living Better With  Heart Failure" Booklet, Daily Weight Tracker Tool   High Risk Criteria for Readmission and/or Poor Patient Outcomes:   EF <30%- No 55-60%  2 or more admissions in 6 months- No  Difficult social situation- No-lives home alone  Demonstrates medication noncompliance- No denies    Barriers of Care:  Knowledge and compliance  Discharge Planning:   Plans to return to home alone.  He is interested in cardiac rehab phase 2.

## 2015-08-23 NOTE — Progress Notes (Signed)
Patient Name: [name] [name] Date of Encounter: 08/23/2015  Principal Problem:   Acute on chronic diastolic CHF (congestive heart failure), NYHA class 3 ([place]) Active Problems:   Diabetes ([place])   HYPERCHOLESTEROLEMIA   Coronary artery disease   HTN (hypertension)   OSA (obstructive sleep apnea)   Chronic anticoagulation   Atrial flutter with controlled response (HCC)   Acute on chronic combined systolic and diastolic CHF (congestive heart failure) ([place])   Length of Stay: 1  SUBJECTIVE  Partially improved. Roughly 2L net diuresis. Intermittent dyspnea, no orthopnea. No angina. No edema. Remains in atrial flutter. INR consistently >2 over last 5 weeks. States that his "dry weight" has been 214-217 for last several months, but he weighs 207 lb today.  CURRENT MEDS . atorvastatin  80 mg Oral Daily  . carvedilol  12.5 mg Oral BID WC  . clopidogrel  75 mg Oral Q breakfast  . furosemide  40 mg Intravenous BID  . insulin glargine  65 Units Subcutaneous BH-[id number]  . isosorbide-hydrALAZINE  1 tablet Oral BID  . potassium chloride  20 mEq Oral BID  . sodium chloride  3 mL Intravenous Q12H  . spironolactone  25 mg Oral Daily  . warfarin  1 mg Oral ONCE-1800  . Warfarin - Pharmacist Dosing Inpatient   Does not apply q1800    OBJECTIVE   Intake/Output Summary (Last 24 hours) at 08/23/15 1241 Last data filed at 08/23/15 1106  Gross per 24 hour  Intake    800 ml  Output   3600 ml  Net  -2800 ml   Filed Weights   08/22/15 2146 08/23/15 0542  Weight: 211 lb 11.2 oz (96.026 kg) 207 lb 8 oz (94.121 kg)    PHYSICAL EXAM Filed Vitals:   08/23/15 0537 08/23/15 0542 08/23/15 0729 08/23/15 1118  BP: 148/83  170/91 161/99  Pulse: 78  77 76  Temp: 97.8 F (36.6 C)  98.5 F (36.9 C) 98 F (36.7 C)  TempSrc: Oral  Oral Oral  Resp: 20  20 18   Height:      Weight:  207 lb 8 oz (94.121 kg)    SpO2: 100%  100% 100%   General: Alert, oriented x3, no distress Head: no evidence of  trauma, PERRL, EOMI, no exophtalmos or lid lag, no myxedema, no xanthelasma; normal ears, nose and oropharynx Neck: normal jugular venous pulsations and no hepatojugular reflux; brisk carotid pulses without delay and no carotid bruits Chest: clear to auscultation, no signs of consolidation by percussion or palpation, normal fremitus, symmetrical and full respiratory excursions Cardiovascular: normal position and quality of the apical impulse, irregular rhythm, normal first and second heart sounds, no rubs or gallops, no murmur Abdomen: no tenderness or distention, no masses by palpation, no abnormal pulsatility or arterial bruits, normal bowel sounds, no hepatosplenomegaly Extremities: no clubbing, cyanosis or edema; 2+ radial, ulnar and brachial pulses bilaterally; 2+ right femoral, posterior tibial and dorsalis pedis pulses; 2+ left femoral, posterior tibial and dorsalis pedis pulses; no subclavian or femoral bruits Neurological: grossly nonfocal  LABS  CBC  Recent Labs  08/22/15 1609  WBC 6.1  HGB 12.8*  HCT 39.1  MCV 84.1  PLT 0000000   Basic Metabolic Panel  Recent Labs  08/22/15 1609 08/23/15 0237  NA 138 139  K 3.8 3.8  CL 103 104  CO2 25 25  GLUCOSE 183* 116*  BUN 10 11  CREATININE 1.13 1.04  CALCIUM 9.0 9.1   Liver Function Tests No results for  input(s): AST, ALT, ALKPHOS, BILITOT, PROT, ALBUMIN in the last 72 hours. No results for input(s): LIPASE, AMYLASE in the last 72 hours. Cardiac Enzymes  Recent Labs  08/22/15 2101 08/23/15 0237 08/23/15 0753  TROPONINI 0.03 0.03 <0.03    Radiology Studies Imaging results have been reviewed and Dg Chest 2 View  08/22/2015  CLINICAL DATA:  Shortness of breath. Centralized chest pressure/chest pain for 1 day. Atrial fibrillation. EXAM: CHEST  2 VIEW COMPARISON:  06/04/2015 FINDINGS: Stable mild enlargement of the cardiopericardial silhouette without edema. Faint chronic interstitial accentuation at the lung bases. Subtle  thickening of the fissures on the lateral projection ; blurring of the lateral view due to motion. No definite pleural effusion. Mild thoracic spondylosis. IMPRESSION: 1. Mild chronic interstitial accentuation particularly in the lung bases, nonspecific. No acute findings. 2. Stable mild enlargement of the cardiopericardial silhouette. Electronically Signed   By: Van Clines M.D.   On: 08/22/2015 16:39    TELE Atrial flutter, ventricular rate 70-80s at rest  ECG Atrial flutter with 4:1AV block and LVH  ASSESSMENT AND PLAN  Acute on chronic diastolic CHF (congestive heart failure), NYHA class 3 ([place]) - improved with diuresis, still symptomatic - normal renal function  - history of ACEi cough, but tolerated ARB in past, currently not receiving any - we need to reestablish what his "dry weight" is  Atrial flutter, controlled ventricular response  - suspect this may have triggered HF decompensation.  - He is appropriately anticoagulated. - Scheduled for DCCV tomorrow. This procedure (including propofol IV or similar deep sedation) has been fully reviewed with the patient and informed consent has been obtained. Should not stop warfarin for minimum of 1 month after DCCV.   Diabetes ([place]) - continue Lantus, hold metformin - SSI   HYPERCHOLESTEROLEMIA - continue statin   Coronary artery disease - s/p drug eluting stents in September, continue clopidogrel at least until end of March, preferably total of 12 months - chest pain resolved with diuresis - normal enzymes   HTN (hypertension) - restarted hydralazine, nitrates (Bidil added) and Lasix; I see no reason why he could not take an ARB. Multiple med changes have been made in last several months when he had low BP due to cholecystitis. Carvedilol increased today, wait another day until we titrate/add other meds   OSA (obstructive sleep apnea) - use CPAP while here - complete workup as OP   Chronic anticoagulation -  continue coumadin; will need to stop both warfarin and clopidogrel whenever he has cholecystectomy (at least 3 more months)   Chronic cholecystitis/biliary drain   Sanda Klein, MD, [facility] [phone] office [phone] pager 08/23/2015 12:41 PM

## 2015-08-23 NOTE — Progress Notes (Signed)
Patient does not tolerate CPAP mask or machine. Mask and tubing is still in room if patient decides to try again.

## 2015-08-23 NOTE — Procedures (Signed)
Placed on on CPAP.  After 2 minutes, pt asked to be removed from the machine.  He did not like the mask or pressure.  After adjusting the settings several time, pt was not tolerating the machine.  RT removed and placed pt on [place] at 3L.  Pt is resting.

## 2015-08-23 NOTE — Progress Notes (Signed)
[place] for Warfarin Indication: atrial fibrillation  Allergies  Allergen Reactions  . Other Hives    Patient reports developing hives after receiving "some antibiotic given in 1980''s at [facility] He does not know which antibiotic.  [name] Ace Inhibitors Cough    Patient Measurements: Total Body Weight: 98.4kg Ideal Body Weight: 79.9kg  Vital Signs: Temp: 98.5 F (36.9 C) (01/10 0729) Temp Source: Oral (01/10 0729) BP: 170/91 mmHg (01/10 0729) Pulse Rate: 77 (01/10 0729)  Labs:  Recent Labs  08/22/15 1609 08/22/15 2101 08/23/15 0237  HGB 12.8*  --   --   HCT 39.1  --   --   PLT 179  --   --   LABPROT 33.3*  --  31.3*  INR 3.36*  --  3.09*  CREATININE 1.13  --  1.04  TROPONINI  --  0.03 0.03    Estimated Creatinine Clearance: 88.8 mL/min (by C-G formula based on Cr of 1.04).  Assessment: 60yo male w/ hx of HTN, DM, PAF on coumadin, CAD w/ distal dz plus DES x2 RCA 09/2014, CKD, nl EF w/ diastolic dysf by echo Q000111Q. Admitted 05/2105 w/ cholecystitis, s/p cholecystostomy tube. Has become more SOB since 4 days ago. Presents w/ PND, orthopnea, increased DOE. He continues on chronic coumadin. INR remains slightly above goal today at 3.09. No new CBC today, no bleeding noted.   PTA warfarin dose: 5mg  daily except 2.5mg  on Tues/Sat - missed dose 1/8, took dose this morning  Goal of Therapy:  INR 2-2.5 while on plavix (per Dr. Claris Gladden progress note 07/28/15) Monitor platelets by anticoagulation protocol: Yes   Plan:  - Give low dose warfarin 1mg  PO x 1 tonight to prevent a drastic INR drop from 2 held doses - Daily INR  Salome Arnt, PharmD, BCPS Pager # [phone] 08/23/2015 8:22 AM

## 2015-08-24 ENCOUNTER — Inpatient Hospital Stay (HOSPITAL_COMMUNITY): Payer: Medicare Other | Admitting: Certified Registered Nurse Anesthetist

## 2015-08-24 ENCOUNTER — Encounter (HOSPITAL_COMMUNITY): Payer: Self-pay | Admitting: Certified Registered Nurse Anesthetist

## 2015-08-24 ENCOUNTER — Encounter (HOSPITAL_COMMUNITY)
Admission: EM | Disposition: A | Discharge status: Home / Self Care | Payer: Self-pay | Source: Home / Self Care | Attending: Cardiology

## 2015-08-24 DIAGNOSIS — Z9861 Coronary angioplasty status: Secondary | ICD-10-CM

## 2015-08-24 DIAGNOSIS — I4891 Unspecified atrial fibrillation: Secondary | ICD-10-CM

## 2015-08-24 DIAGNOSIS — I5189 Other ill-defined heart diseases: Secondary | ICD-10-CM | POA: Diagnosis present

## 2015-08-24 DIAGNOSIS — I480 Paroxysmal atrial fibrillation: Secondary | ICD-10-CM

## 2015-08-24 HISTORY — PX: CARDIOVERSION: SHX1299

## 2015-08-24 LAB — PROTIME-INR
INR: 2.02 — AB (ref 0.00–1.49)
Prothrombin Time: 22.7 seconds — ABNORMAL HIGH (ref 11.6–15.2)

## 2015-08-24 LAB — BASIC METABOLIC PANEL
ANION GAP: 9 (ref 5–15)
BUN: 18 mg/dL (ref 6–20)
CHLORIDE: 100 mmol/L — AB (ref 101–111)
CO2: 28 mmol/L (ref 22–32)
Calcium: 9.1 mg/dL (ref 8.9–10.3)
Creatinine, Ser: 1.19 mg/dL (ref 0.61–1.24)
GFR calc non Af Amer: >60 mL/min (ref >60)
GLUCOSE: 144 mg/dL — AB (ref 65–99)
Potassium: 4.5 mmol/L (ref 3.5–5.1)
Sodium: 137 mmol/L (ref 135–145)

## 2015-08-24 LAB — GLUCOSE, CAPILLARY
Glucose-Capillary: 154 mg/dL — ABNORMAL HIGH (ref 65–99)
Glucose-Capillary: 111 mg/dL — ABNORMAL HIGH (ref 65–99)

## 2015-08-24 SURGERY — CARDIOVERSION
Anesthesia: General

## 2015-08-24 MED ORDER — STERILE WATER FOR INJECTION IJ SOLN
INTRAMUSCULAR | Status: AC
  Filled 2015-08-24: qty 10

## 2015-08-24 MED ORDER — DEXAMETHASONE SODIUM PHOSPHATE 4 MG/ML IJ SOLN
INTRAMUSCULAR | Status: AC
  Filled 2015-08-24: qty 1

## 2015-08-24 MED ORDER — WARFARIN SODIUM 5 MG PO TABS: 2.5000 mg | ORAL_TABLET | Freq: Every day | ORAL | Status: DC

## 2015-08-24 MED ORDER — CARVEDILOL 12.5 MG PO TABS: 12.5000 mg | ORAL_TABLET | Freq: Two times a day (BID) | ORAL | Status: DC

## 2015-08-24 MED ORDER — EPHEDRINE SULFATE 50 MG/ML IJ SOLN
INTRAMUSCULAR | Status: AC
  Filled 2015-08-24: qty 1

## 2015-08-24 MED ORDER — LIDOCAINE HCL (CARDIAC) 20 MG/ML IV SOLN
INTRAVENOUS | Status: DC | PRN
  Administered 2015-08-24: 60 mg via INTRATRACHEAL

## 2015-08-24 MED ORDER — WARFARIN SODIUM 5 MG PO TABS: 5.0000 mg | ORAL_TABLET | Freq: Once | ORAL | Status: DC

## 2015-08-24 MED ORDER — PROPOFOL 10 MG/ML IV BOLUS
INTRAVENOUS | Status: AC
  Filled 2015-08-24: qty 40

## 2015-08-24 MED ORDER — INSULIN GLARGINE 100 UNIT/ML SOLOSTAR PEN: 65.0000 [IU] | PEN_INJECTOR | SUBCUTANEOUS | Status: DC

## 2015-08-24 MED ORDER — ISOSORB DINITRATE-HYDRALAZINE 20-37.5 MG PO TABS: 1.0000 | ORAL_TABLET | Freq: Two times a day (BID) | ORAL | Status: DC

## 2015-08-24 MED ORDER — PROPOFOL 10 MG/ML IV BOLUS
INTRAVENOUS | Status: DC | PRN
  Administered 2015-08-24: 60 mg via INTRAVENOUS

## 2015-08-24 NOTE — Anesthesia Preprocedure Evaluation (Addendum)
Anesthesia Evaluation  Patient identified by MRN, date of birth, ID band Patient awake    Reviewed: Allergy & Precautions, H&P , NPO status , Patient's Chart, lab work & pertinent test results  Airway Mallampati: II  TM Distance: >3 FB Neck ROM: Full    Dental no notable dental hx. (+) Partial Upper, Edentulous Lower, Dental Advisory Given   Pulmonary sleep apnea , former smoker,    Pulmonary exam normal breath sounds clear to auscultation       Cardiovascular hypertension, Pt. on medications and Pt. on home beta blockers + angina + CAD and +CHF  + dysrhythmias Atrial Fibrillation  Rhythm:Irregular Rate:Tachycardia     Neuro/Psych Anxiety negative neurological ROS  negative psych ROS   GI/Hepatic negative GI ROS, Neg liver ROS,   Endo/Other  diabetes, Insulin Dependent, Oral Hypoglycemic Agents  Renal/GU Renal disease  negative genitourinary   Musculoskeletal   Abdominal   Peds  Hematology negative hematology ROS (+)   Anesthesia Other Findings   Reproductive/Obstetrics negative OB ROS                            Anesthesia Physical Anesthesia Plan  ASA: III  Anesthesia Plan: General   Post-op Pain Management:    Induction: Intravenous  Airway Management Planned: Mask  Additional Equipment:   Intra-op Plan:   Post-operative Plan:   Informed Consent: I have reviewed the patients History and Physical, chart, labs and discussed the procedure including the risks, benefits and alternatives for the proposed anesthesia with the patient or authorized representative who has indicated his/her understanding and acceptance.   Dental advisory given  Plan Discussed with: CRNA  Anesthesia Plan Comments:         Anesthesia Quick Evaluation

## 2015-08-24 NOTE — CV Procedure (Signed)
    Cardioversion Note  Iktan Boyce [id number] 03-20-1956  Procedure: DC Cardioversion Indications:   Procedure Details Consent: Obtained Time Out: Verified patient identification, verified procedure, site/side was marked, verified correct patient position, special equipment/implants available, Radiology Safety Procedures followed,  medications/allergies/relevent history reviewed, required imaging and test results available.  Performed  The patient has been on adequate anticoagulation.  The patient received IV Lidocaine 60 mg followed by Propofol 60 mg IV  for sedation.  Synchronous cardioversion was performed at 200  joules.  The cardioversion was successful     Complications: No apparent complications Patient did tolerate procedure well.   Thayer Headings, Brooke Bonito., MD, [facility] 08/24/2015, 12:10 PM

## 2015-08-24 NOTE — Interval H&P Note (Signed)
History and Physical Interval Note:  08/24/2015 5:23 AM  [name] [name]  has presented today for surgery, with the diagnosis of a flutter  The various methods of treatment have been discussed with the patient and family. After consideration of risks, benefits and other options for treatment, the patient has consented to  Procedure(s): CARDIOVERSION (N/A) as a surgical intervention .  The patient's history has been reviewed, patient examined, no change in status, stable for surgery.  I have reviewed the patient's chart and labs.  Questions were answered to the patient's satisfaction.     Ontario Pettengill, Wonda Cheng

## 2015-08-24 NOTE — Anesthesia Postprocedure Evaluation (Signed)
Anesthesia Post Note  Patient: [name] [name]  Procedure(s) Performed: Procedure(s) (LRB): CARDIOVERSION (N/A)  Patient location during evaluation: Endoscopy Anesthesia Type: General Level of consciousness: awake and alert Pain management: pain level controlled Vital Signs Assessment: post-procedure vital signs reviewed and stable Respiratory status: spontaneous breathing and respiratory function stable Cardiovascular status: stable Postop Assessment: no signs of nausea or vomiting Anesthetic complications: no    Last Vitals:  Filed Vitals:   08/24/15 0512 08/24/15 1119  BP: 142/79 122/82  Pulse: 80 105  Temp: 36.6 C   Resp: 18 19    Last Pain:  Filed Vitals:   08/24/15 1120  PainSc: 7                  YACOUB, Taylynn Easton B

## 2015-08-24 NOTE — Transfer of Care (Signed)
Immediate Anesthesia Transfer of Care Note  Patient: [name] [name]  Procedure(s) Performed: Procedure(s): CARDIOVERSION (N/A)  Patient Location: Endo 55  Anesthesia Type:General  Level of Consciousness: awake, alert  and oriented  Airway & Oxygen Therapy: Patient Spontanous Breathing  Post-op Assessment: Report given to RN and Post -op Vital signs reviewed and stable  Post vital signs: Reviewed and stable  Last Vitals:  Filed Vitals:   08/24/15 0512 08/24/15 1119  BP: 142/79 122/82  Pulse: 80 105  Temp: 36.6 C   Resp: 18 19    Complications: No apparent anesthesia complications

## 2015-08-24 NOTE — Discharge Instructions (Signed)
Electrical Cardioversion °Electrical cardioversion is the delivery of a jolt of electricity to change the rhythm of the heart. Sticky patches or metal paddles are placed on the chest to deliver the electricity from a device. This is done to restore a normal rhythm. A rhythm that is too fast or not regular keeps the heart from pumping well. °Electrical cardioversion is done in an emergency if:  °· There is low or no blood pressure as a result of the heart rhythm.   °· Normal rhythm must be restored as fast as possible to protect the brain and heart from further damage.   °· It may save a life. °Cardioversion may be done for heart rhythms that are not immediately life threatening, such as atrial fibrillation or flutter, in which:  °· The heart is beating too fast or is not regular.   °· Medicine to change the rhythm has not worked.   °· It is safe to wait in order to allow time for preparation. °· Symptoms of the abnormal rhythm are bothersome. °· The risk of stroke and other serious problems can be reduced. °LET YOUR HEALTH CARE PROVIDER KNOW ABOUT:  °· Any allergies you have. °· All medicines you are taking, including vitamins, herbs, eye drops, creams, and over-the-counter medicines. °· Previous problems you or members of your family have had with the use of anesthetics.   °· Any blood disorders you have.   °· Previous surgeries you have had.   °· Medical conditions you have. °RISKS AND COMPLICATIONS  °Generally, this is a safe procedure. However, problems can occur and include:  °· Breathing problems related to the anesthetic used. °· A blood clot that breaks free and travels to other parts of your body. This could cause a stroke or other problems. The risk of this is lowered by use of blood-thinning medicine (anticoagulant) prior to the procedure. °· Cardiac arrest (rare). °BEFORE THE PROCEDURE  °· You may have tests to detect blood clots in your heart and to evaluate heart function.  °· You may start taking  anticoagulants so your blood does not clot as easily.   °· Medicines may be given to help stabilize your heart rate and rhythm. °PROCEDURE °· You will be given medicine through an IV tube to reduce discomfort and make you sleepy (sedative).   °· An electrical shock will be delivered. °AFTER THE PROCEDURE °Your heart rhythm will be watched to make sure it does not change.  °  °This information is not intended to replace advice given to you by your health care provider. Make sure you discuss any questions you have with your health care provider. °  °Document Released: 07/20/2002 Document Revised: 08/20/2014 Document Reviewed: 02/11/2013 °Elsevier Interactive Patient Education ©2016 [employer] ° °

## 2015-08-24 NOTE — Interval H&P Note (Signed)
History and Physical Interval Note:  08/24/2015 11:45 AM  [name] [name]  has presented today for surgery, with the diagnosis of a flutter  The various methods of treatment have been discussed with the patient and family. After consideration of risks, benefits and other options for treatment, the patient has consented to  Procedure(s): CARDIOVERSION (N/A) as a surgical intervention .  The patient's history has been reviewed, patient examined, no change in status, stable for surgery.  I have reviewed the patient's chart and labs.  Questions were answered to the patient's satisfaction.     Jeffifer Rabold, Wonda Cheng

## 2015-08-24 NOTE — H&P (View-Only) (Signed)
Patient Name: [name] [name] Date of Encounter: 08/23/2015  Principal Problem:   Acute on chronic diastolic CHF (congestive heart failure), NYHA class 3 ([place]) Active Problems:   Diabetes ([place])   HYPERCHOLESTEROLEMIA   Coronary artery disease   HTN (hypertension)   OSA (obstructive sleep apnea)   Chronic anticoagulation   Atrial flutter with controlled response (HCC)   Acute on chronic combined systolic and diastolic CHF (congestive heart failure) ([place])   Length of Stay: 1  SUBJECTIVE  Partially improved. Roughly 2L net diuresis. Intermittent dyspnea, no orthopnea. No angina. No edema. Remains in atrial flutter. INR consistently >2 over last 5 weeks. States that his "dry weight" has been 214-217 for last several months, but he weighs 207 lb today.  CURRENT MEDS . atorvastatin  80 mg Oral Daily  . carvedilol  12.5 mg Oral BID WC  . clopidogrel  75 mg Oral Q breakfast  . furosemide  40 mg Intravenous BID  . insulin glargine  65 Units Subcutaneous [id number]  . isosorbide-hydrALAZINE  1 tablet Oral BID  . potassium chloride  20 mEq Oral BID  . sodium chloride  3 mL Intravenous Q12H  . spironolactone  25 mg Oral Daily  . warfarin  1 mg Oral ONCE-1800  . Warfarin - Pharmacist Dosing Inpatient   Does not apply q1800    OBJECTIVE   Intake/Output Summary (Last 24 hours) at 08/23/15 1241 Last data filed at 08/23/15 1106  Gross per 24 hour  Intake    800 ml  Output   3600 ml  Net  -2800 ml   Filed Weights   08/22/15 2146 08/23/15 0542  Weight: 211 lb 11.2 oz (96.026 kg) 207 lb 8 oz (94.121 kg)    PHYSICAL EXAM Filed Vitals:   08/23/15 0537 08/23/15 0542 08/23/15 0729 08/23/15 1118  BP: 148/83  170/91 161/99  Pulse: 78  77 76  Temp: 97.8 F (36.6 C)  98.5 F (36.9 C) 98 F (36.7 C)  TempSrc: Oral  Oral Oral  Resp: 20  20 18   Height:      Weight:  207 lb 8 oz (94.121 kg)    SpO2: 100%  100% 100%   General: Alert, oriented x3, no distress Head: no evidence of  trauma, PERRL, EOMI, no exophtalmos or lid lag, no myxedema, no xanthelasma; normal ears, nose and oropharynx Neck: normal jugular venous pulsations and no hepatojugular reflux; brisk carotid pulses without delay and no carotid bruits Chest: clear to auscultation, no signs of consolidation by percussion or palpation, normal fremitus, symmetrical and full respiratory excursions Cardiovascular: normal position and quality of the apical impulse, irregular rhythm, normal first and second heart sounds, no rubs or gallops, no murmur Abdomen: no tenderness or distention, no masses by palpation, no abnormal pulsatility or arterial bruits, normal bowel sounds, no hepatosplenomegaly Extremities: no clubbing, cyanosis or edema; 2+ radial, ulnar and brachial pulses bilaterally; 2+ right femoral, posterior tibial and dorsalis pedis pulses; 2+ left femoral, posterior tibial and dorsalis pedis pulses; no subclavian or femoral bruits Neurological: grossly nonfocal  LABS  CBC  Recent Labs  08/22/15 1609  WBC 6.1  HGB 12.8*  HCT 39.1  MCV 84.1  PLT 0000000   Basic Metabolic Panel  Recent Labs  08/22/15 1609 08/23/15 0237  NA 138 139  K 3.8 3.8  CL 103 104  CO2 25 25  GLUCOSE 183* 116*  BUN 10 11  CREATININE 1.13 1.04  CALCIUM 9.0 9.1   Liver Function Tests No results for  input(s): AST, ALT, ALKPHOS, BILITOT, PROT, ALBUMIN in the last 72 hours. No results for input(s): LIPASE, AMYLASE in the last 72 hours. Cardiac Enzymes  Recent Labs  08/22/15 2101 08/23/15 0237 08/23/15 0753  TROPONINI 0.03 0.03 <0.03    Radiology Studies Imaging results have been reviewed and Dg Chest 2 View  08/22/2015  CLINICAL DATA:  Shortness of breath. Centralized chest pressure/chest pain for 1 day. Atrial fibrillation. EXAM: CHEST  2 VIEW COMPARISON:  06/04/2015 FINDINGS: Stable mild enlargement of the cardiopericardial silhouette without edema. Faint chronic interstitial accentuation at the lung bases. Subtle  thickening of the fissures on the lateral projection ; blurring of the lateral view due to motion. No definite pleural effusion. Mild thoracic spondylosis. IMPRESSION: 1. Mild chronic interstitial accentuation particularly in the lung bases, nonspecific. No acute findings. 2. Stable mild enlargement of the cardiopericardial silhouette. Electronically Signed   By: Van Clines M.D.   On: 08/22/2015 16:39    TELE Atrial flutter, ventricular rate 70-80s at rest  ECG Atrial flutter with 4:1AV block and LVH  ASSESSMENT AND PLAN  Acute on chronic diastolic CHF (congestive heart failure), NYHA class 3 ([place]) - improved with diuresis, still symptomatic - normal renal function  - history of ACEi cough, but tolerated ARB in past, currently not receiving any - we need to reestablish what his "dry weight" is  Atrial flutter, controlled ventricular response  - suspect this may have triggered HF decompensation.  - He is appropriately anticoagulated. - Scheduled for DCCV tomorrow. This procedure (including propofol IV or similar deep sedation) has been fully reviewed with the patient and informed consent has been obtained. Should not stop warfarin for minimum of 1 month after DCCV.   Diabetes ([name]) - continue Lantus, hold metformin - SSI   HYPERCHOLESTEROLEMIA - continue statin   Coronary artery disease - s/p drug eluting stents in September, continue clopidogrel at least until end of March, preferably total of 12 months - chest pain resolved with diuresis - normal enzymes   HTN (hypertension) - restarted hydralazine, nitrates (Bidil added) and Lasix; I see no reason why he could not take an ARB. Multiple med changes have been made in last several months when he had low BP due to cholecystitis. Carvedilol increased today, wait another day until we titrate/add other meds   OSA (obstructive sleep apnea) - use CPAP while here - complete workup as OP   Chronic anticoagulation -  continue coumadin; will need to stop both warfarin and clopidogrel whenever he has cholecystectomy (at least 3 more months)   Chronic cholecystitis/biliary drain   Sanda Klein, MD, [facility] [phone] office [phone] pager 08/23/2015 12:41 PM

## 2015-08-24 NOTE — Discharge Summary (Signed)
Patient ID: Cambridge Whitfield,  MRN: [id number], DOB/AGE: 03-25-1956 60 y.o.  Admit date: 08/22/2015 Discharge date: 08/24/2015  Primary Care Provider: Angelica Chessman, MD Primary Cardiologist: Dr Aundra Dubin  Discharge Diagnoses Principal Problem:   Acute on chronic diastolic CHF (congestive heart failure), NYHA class 3 ([place]) Active Problems:   Atrial flutter with controlled response (HCC)   Diabetes ([place])   Paroxysmal atrial fibrillation ([place])   Cholelithiases- drain in place   CAD S/P RCA PCI with DES Sept 2016   Chronic anticoagulation   Diastolic dysfunction-grade 2 Sept 2016   HYPERCHOLESTEROLEMIA   OSA (obstructive sleep apnea)   Essential hypertension    Procedures: DCCV 08/24/15   Hospital Course:  60 y.o. AA male with a history of HTN, DM, PAF on coumadin (intol to Amiodarone previously), CAD w/ distal dz plus DES x 2 RCA 04/2015, nl EF w/ grade 2 diastolic dysf by echo Q000111Q. Admitted 05/2015 w/ cholecystitis, s/p cholecystostomy tube. BP low at that time, taken off amlodipine, Coreg, Laisx, hydralazine, or Imdur. Needs cholecystectomy, but plan is to wait until he can be off his Plavix. Admitted 08/22/15 with dyspnea, found to be in A flutter- (BNP 331). He underwent DCCV to NSR 08/24/15. After DCCV his dyspnea improved and Dr Sallyanne Kuster felt he could go home. He'll f/u with an APP in 2 weeks.  Discharge Vitals:  Blood pressure 131/84, pulse 89, temperature 97.7 F (36.5 C), temperature source Oral, resp. rate 20, height 6' (1.829 m), weight 205 lb 12.8 oz (93.35 kg), SpO2 99 %.    Labs: Results for orders placed or performed during the hospital encounter of 08/22/15 (from the past 24 hour(s))  Glucose, capillary     Status: Abnormal   Collection Time: 08/23/15  4:43 PM  Result Value Ref Range   Glucose-Capillary 146 (H) 65 - 99 mg/dL   Comment 1 Notify RN   Basic metabolic panel     Status: Abnormal   Collection Time: 08/24/15  3:47 AM  Result Value Ref Range   Sodium 137 135 - 145 mmol/L   Potassium 4.5 3.5 - 5.1 mmol/L   Chloride 100 (L) 101 - 111 mmol/L   CO2 28 22 - 32 mmol/L   Glucose, Bld 144 (H) 65 - 99 mg/dL   BUN 18 6 - 20 mg/dL   Creatinine, Ser 1.19 0.61 - 1.24 mg/dL   Calcium 9.1 8.9 - 10.3 mg/dL   GFR calc non Af Amer >60 >60 mL/min   GFR calc Af Amer >60 >60 mL/min   Anion gap 9 5 - 15  Protime-INR     Status: Abnormal   Collection Time: 08/24/15  3:47 AM  Result Value Ref Range   Prothrombin Time 22.7 (H) 11.6 - 15.2 seconds   INR 2.02 (H) 0.00 - 1.49  Glucose, capillary     Status: Abnormal   Collection Time: 08/24/15  5:38 AM  Result Value Ref Range   Glucose-Capillary 154 (H) 65 - 99 mg/dL   Comment 1 Notify RN    Comment 2 Document in Chart   Glucose, capillary     Status: Abnormal   Collection Time: 08/24/15 11:25 AM  Result Value Ref Range   Glucose-Capillary 111 (H) 65 - 99 mg/dL    Disposition:      Follow-up Information    Follow up with Loralie Champagne, MD.   Specialty:  Cardiology   Why:  office will contact you   Contact information:   [address]  [address] [place] [address] [phone]       Discharge Medications:    Medication List    STOP taking these medications        amLODipine 10 MG tablet  Commonly known as:  NORVASC      TAKE these medications        acetaminophen 500 MG tablet  Commonly known as:  TYLENOL  Take 1,000 mg by mouth every 6 (six) hours as needed (pain). Reported on 08/18/2015     atorvastatin 80 MG tablet  Commonly known as:  LIPITOR  Take 1 tablet (80 mg total) by mouth daily.     carvedilol 12.5 MG tablet  Commonly known as:  COREG  Take 1 tablet (12.5 mg total) by mouth 2 (two) times daily with a meal.     clopidogrel 75 MG tablet  Commonly known as:  PLAVIX  Take 1 tablet (75 mg total) by mouth daily with breakfast.     Insulin Glargine 100 UNIT/ML Solostar Pen  Commonly known as:  LANTUS SOLOSTAR  Inject 65 Units into the skin every  morning. And pen needles 1/day     isosorbide-hydrALAZINE 20-37.5 MG tablet  Commonly known as:  BIDIL  Take 1 tablet by mouth 2 (two) times daily.     metFORMIN 1000 MG tablet  Commonly known as:  GLUCOPHAGE  Take 1 tablet (1,000 mg total) by mouth 2 (two) times daily with a meal.     nitroGLYCERIN 0.4 MG SL tablet  Commonly known as:  NITROSTAT  Place 0.4 mg under the tongue every 5 (five) minutes as needed for chest pain (x 3 tabs daily). Reported on 08/18/2015     spironolactone 25 MG tablet  Commonly known as:  ALDACTONE  Take 1 tablet (25 mg total) by mouth daily.     warfarin 5 MG tablet  Commonly known as:  COUMADIN  Take 0.5-1 tablets (2.5-5 mg total) by mouth at bedtime. Take 1/2 tablet (2.5 mg) on Tues and Sat  Takes 1 tablet all other days         Duration of Discharge Encounter: Greater than 30 minutes including physician time.  Angelena Form PA-C 08/24/2015 3:35 PM

## 2015-08-24 NOTE — Clinical Documentation Improvement (Signed)
Cardiology  Can the diagnosis of Atrial Flutter be further specified? Thank you    Atypical (Type II)  Typical (Type I)  Other  Clinically Undetermined    Please exercise your independent, professional judgment when responding. A specific answer is not anticipated or expected.   Thank You,  [place] [phone]

## 2015-08-24 NOTE — Progress Notes (Signed)
Subjective:  Still complains of dyspnea. Flat in bed this am when examined.   Objective:  Vital Signs in the last 24 hours: Temp:  [97.9 F (36.6 C)-98.4 F (36.9 C)] 97.9 F (36.6 C) (01/11 0512) Pulse Rate:  [74-80] 80 (01/11 0512) Resp:  [18] 18 (01/11 0512) BP: (133-165)/(75-105) 142/79 mmHg (01/11 0512) SpO2:  [94 %-100 %] 99 % (01/11 0512) Weight:  [205 lb 12.8 oz (93.35 kg)] 205 lb 12.8 oz (93.35 kg) (01/11 0521)  Intake/Output from previous day:  Intake/Output Summary (Last 24 hours) at 08/24/15 0839 Last data filed at 08/24/15 0512  Gross per 24 hour  Intake    840 ml  Output   2025 ml  Net  -1185 ml    Physical Exam: General appearance: alert, cooperative and no distress Lungs: clear to auscultation bilaterally Heart: irregularly irregular rhythm Extremities: no edema   Rate: 80  Rhythm: atrial flutter  Lab Results:  Recent Labs  08/22/15 1609  WBC 6.1  HGB 12.8*  PLT 179    Recent Labs  08/23/15 1404 08/24/15 0347  NA 139 137  K 3.8 4.5  CL 99* 100*  CO2 28 28  GLUCOSE 193* 144*  BUN 11 18  CREATININE 1.27* 1.19    Recent Labs  08/23/15 0237 08/23/15 0753  TROPONINI 0.03 <0.03    Recent Labs  08/24/15 0347  INR 2.02*    Scheduled Meds: . atorvastatin  80 mg Oral Daily  . carvedilol  12.5 mg Oral BID WC  . clopidogrel  75 mg Oral Q breakfast  . furosemide  40 mg Intravenous BID  . insulin glargine  65 Units Subcutaneous [id number]  . isosorbide-hydrALAZINE  1 tablet Oral BID  . potassium chloride  20 mEq Oral BID  . sodium chloride  3 mL Intravenous Q12H  . spironolactone  25 mg Oral Daily  . warfarin  5 mg Oral Once  . Warfarin - Pharmacist Dosing Inpatient   Does not apply q1800   Continuous Infusions: . sodium chloride     PRN Meds:.acetaminophen, sodium chloride   Imaging: Imaging results have been reviewed  Cardiac Studies: Echo 05/02/15 Study Conclusions  - Left ventricle: The cavity size was normal. There  was mild concentric hypertrophy. Systolic function was normal. The estimated ejection fraction was in the range of 55% to 60%. Wall motion was normal; there were no regional wall motion abnormalities. Features are consistent with a pseudonormal left ventricular filling pattern, with concomitant abnormal relaxation and increased filling pressure (grade 2 diastolic dysfunction). Doppler parameters are consistent with elevated ventricular end-diastolic filling pressure. - Aortic valve: Trileaflet; normal thickness leaflets. There was trivial regurgitation. - Aortic root: The aortic root was normal in size. - Mitral valve: Mildly thickened leaflets . There was mild regurgitation. - Left atrium: The atrium was moderately dilated. - Right ventricle: The cavity size was normal. Wall thickness was normal. Systolic function was normal. - Right atrium: The atrium was normal in size. - Tricuspid valve: There was mild regurgitation. - Pulmonic valve: There was no regurgitation. - Pulmonary arteries: Systolic pressure was within the normal range. - Inferior vena cava: The vessel was normal in size. - Pericardium, extracardiac: There was no pericardial effusion.  Impressions:  - There is no significant difference when compared to the prior   study from 09/29/2014.    Assessment/Plan:  60 y.o. AA male with a history of HTN, DM, PAF on coumadin (intol to Amiodarone previously),  CAD w/ distal dz  plus DES x 2 RCA 04/2015,  nl EF w/ grade 2 diastolic dysf by echo [id number]. Admitted 05/2015 w/ cholecystitis, s/p cholecystostomy tube. BP low at that time, taken off amlodipine, Coreg, Laisx, hydralazine, or Imdur. Needs cholecystectomy, but plan is to wait until he can be off his Plavix. Admitted 08/22/15 with dyspnea, found to be in A flutter- (BNP 331).   Principal Problem:   Acute on chronic diastolic CHF (congestive heart failure), NYHA class 3 (HCC) Active Problems:    Atrial flutter with controlled response (HCC)   Diabetes (HCC)   Paroxysmal atrial fibrillation (HCC)   Cholelithiases- drain in place   CAD S/P RCA PCI with DES Sept 2016   Chronic anticoagulation   Diastolic dysfunction-grade 2 Sept 2016   HYPERCHOLESTEROLEMIA   OSA (obstructive sleep apnea)   Essential hypertension   PLAN: DCCV today.   Kerin Ransom PA-C 08/24/2015, 8:39 AM [phone]  I have seen and examined the patient along with Kerin Ransom PA-C.  I have reviewed the chart, notes and new data.  I agree with PA's note.  Key new complaints: subjective dyspnea persists although he is lying fully flat and looks comfortable Key examination changes:  irregular rhythm, clear lungs   PLAN: Should not stop warfarin for minimum of 1 month after DCCV. Should not stop antiplatelet therapy until late March. BP control seems much better, adequate on current regimen. May need additional fine tuning as an outpatient. DCCV today. This procedure has been fully reviewed with the patient and written informed consent has been obtained.  Sanda Klein, MD, [place] [phone] 08/24/2015, 12:18 PM

## 2015-08-24 NOTE — Progress Notes (Signed)
[name] for Warfarin Indication: atrial fibrillation  Allergies  Allergen Reactions  . Other Hives    Patient reports developing hives after receiving "some antibiotic given in 1980''s at [facility]". He does not know which antibiotic.  [name] Ace Inhibitors Cough    Patient Measurements: Total Body Weight: 98.4kg Ideal Body Weight: 79.9kg  Vital Signs: Temp: 97.9 F (36.6 C) (01/11 0512) Temp Source: Oral (01/11 0512) BP: 142/79 mmHg (01/11 0512) Pulse Rate: 80 (01/11 0512)  Labs:  Recent Labs  08/22/15 1609 08/22/15 2101 08/23/15 0237 08/23/15 0753 08/23/15 1404 08/24/15 0347  HGB 12.8*  --   --   --   --   --   HCT 39.1  --   --   --   --   --   PLT 179  --   --   --   --   --   LABPROT 33.3*  --  31.3*  --  26.1* 22.7*  INR 3.36*  --  3.09*  --  2.43* 2.02*  CREATININE 1.13  --  1.04  --  1.27* 1.19  TROPONINI  --  0.03 0.03 <0.03  --   --     Estimated Creatinine Clearance: 77.4 mL/min (by C-G formula based on Cr of 1.19).  Assessment: 60yo male w/ hx of HTN, DM, PAF on coumadin, CAD w/ distal dz plus DES x2 RCA 09/2014, CKD, nl EF w/ diastolic dysf by echo Q000111Q. Admitted 05/2105 w/ cholecystitis, s/p cholecystostomy tube. Has become more SOB since 4 days ago. Presents w/ PND, orthopnea, increased DOE. He continues on chronic coumadin. INR is now therapeutic at 2.02. No new CBC today, no bleeding noted. Pt did not receive his dose last night for unclear reasons.   PTA warfarin dose: 5mg  daily except 2.5mg  on Tues/Sat - missed dose 1/8, took dose 1/9 PTA  Goal of Therapy:  INR 2-2.5 while on plavix (per Dr. Claris Gladden progress note 07/28/15) Monitor platelets by anticoagulation protocol: Yes   Plan:  - Warfarin 5mg  PO x 1 today at noon - Daily INR  Salome Arnt, PharmD, BCPS Pager # [phone] 08/24/2015 8:17 AM

## 2015-08-24 NOTE — Progress Notes (Signed)
Patient was discharge to home accompanied by 2 children and NT via wheelchair/private car. Discharge instructions given, medication regimen and education given. Patient verbalized understanding All personal belongings given. Telemetry box and IV removed and site in good condition.

## 2015-08-25 ENCOUNTER — Telehealth: Payer: Self-pay | Admitting: Cardiology

## 2015-08-25 ENCOUNTER — Encounter (HOSPITAL_COMMUNITY): Payer: Self-pay | Admitting: Cardiovascular Disease

## 2015-08-25 NOTE — Telephone Encounter (Signed)
Please advise 

## 2015-08-25 NOTE — Telephone Encounter (Signed)
New message      Pt was discharged from hosp yesterday.  One of his discharge medications ---isosorbide-hydralazine 20-37.5 is too expensive to get. Patient already has (at home) a presc for hydralazine 50mg  and isosorbide mono ER 60mg .  Can he just take these and not get the new presc filled?

## 2015-08-26 MED ORDER — ISOSORBIDE MONONITRATE ER 60 MG PO TB24: 60.0000 mg | ORAL_TABLET | Freq: Every day | ORAL | Status: DC

## 2015-08-26 MED ORDER — HYDRALAZINE HCL 50 MG PO TABS: 50.0000 mg | ORAL_TABLET | Freq: Three times a day (TID) | ORAL | Status: DC

## 2015-08-26 NOTE — Telephone Encounter (Signed)
Per Dr Orpah Greek can take hydralazine 50mg  tid and isosorbide mononitrate ER 60mg  daily instead of Bi-Dil 20-37.5.  Pt's daughter [place] advised, verbalized understanding.

## 2015-08-30 ENCOUNTER — Other Ambulatory Visit: Payer: Self-pay | Admitting: Pharmacist

## 2015-08-30 ENCOUNTER — Telehealth (HOSPITAL_COMMUNITY): Payer: Self-pay | Admitting: Surgery

## 2015-08-30 NOTE — Patient Outreach (Signed)
[name] [name]) Care Management  [place]   08/30/2015  [name] [name] Jan 07, 1956 [id number]  Subjective: [name] [name] is a 60yo with a recent hospitalization for acute on chronic diastolic heart failure (EF 55-60% on 05/02/15).  I identified patient as eligible for pharmacy transition of care project.  Medication review completed.  I called patient and read consent.  Patient agreed to participate in transition of care project.  I reviewed all medications with the patient.  Patient reports adherence with medications.  Patient reports weighing daily as instructed.  Denies shortness or breath or swelling.    Patient has hospital follow up scheduled for 10/07/15 with Dr. Aundra Dubin.  Patient reports he does not currently have an appointment scheduled with his primary care provider.  Patient plans to call his primary care provider tomorrow to schedule a follow up appointment.    Objective:   Current Medications: Current Outpatient Prescriptions  Medication Sig Dispense Refill  . acetaminophen (TYLENOL) 500 MG tablet Take 1,000 mg by mouth every 6 (six) hours as needed (pain). Reported on 08/18/2015    . atorvastatin (LIPITOR) 80 MG tablet Take 1 tablet (80 mg total) by mouth daily. 90 tablet 3  . carvedilol (COREG) 12.5 MG tablet Take 1 tablet (12.5 mg total) by mouth 2 (two) times daily with a meal. 60 tablet 11  . clopidogrel (PLAVIX) 75 MG tablet Take 1 tablet (75 mg total) by mouth daily with breakfast. 30 tablet 6  . hydrALAZINE (APRESOLINE) 50 MG tablet Take 1 tablet (50 mg total) by mouth 3 (three) times daily. (Patient taking differently: Take 50 mg by mouth 2 (two) times daily. ) 270 tablet 0  . Insulin Glargine (LANTUS SOLOSTAR) 100 UNIT/ML Solostar Pen Inject 65 Units into the skin every morning. And pen needles 1/day 10 pen PRN  . isosorbide mononitrate (IMDUR) 60 MG 24 hr tablet Take 1 tablet (60 mg total) by mouth daily. 90 tablet 0  . metFORMIN (GLUCOPHAGE) 1000  MG tablet Take 1 tablet (1,000 mg total) by mouth 2 (two) times daily with a meal. 180 tablet 1  . nitroGLYCERIN (NITROSTAT) 0.4 MG SL tablet Place 0.4 mg under the tongue every 5 (five) minutes as needed for chest pain (x 3 tabs daily). Reported on 08/18/2015    . spironolactone (ALDACTONE) 25 MG tablet Take 1 tablet (25 mg total) by mouth daily. 30 tablet 6  . warfarin (COUMADIN) 5 MG tablet Take 0.5-1 tablets (2.5-5 mg total) by mouth at bedtime. Take 1/2 tablet (2.5 mg) on Tues and Sat  Takes 1 tablet all other days 90 tablet 3   No current facility-administered medications for this visit.   Functional Status: In your present state of health, do you have any difficulty performing the following activities: 08/22/2015 06/06/2015  Hearing? N -  Vision? N -  Difficulty concentrating or making decisions? N -  Walking or climbing stairs? N N  Dressing or bathing? N -  Doing errands, shopping? N -   Fall/Depression Screening: PHQ 2/9 Scores 08/18/2015 05/26/2015  PHQ - 2 Score 0 0    Assessment: 1.  Medication Review:  Drugs sorted by system:  Neurologic/Psychologic: none  Cardiovascular: atorvastatin, carvedilol, clopidogrel, hydralazine, isosorbide mononitrate, nitroglycerin SL, spironolactone, warfarin  Pulmonary/Allergy: none  Gastrointestinal: none  Endocrine: Lantus, metformin  Renal: none  Topical: none  Pain: acetaminophen PRN  Vitamins/Minerals: none  Infectious Diseases: none  Miscellaneous: none   Duplications in therapy: none noted Gaps in therapy: ARB for diabetes Medications  to avoid in the elderly: N/A, patient <65yo Drug interactions: warfarin and clopidogrel (may increase risk of bleeding)  Other issues noted:  - Patient reports taking furosemide 40mg  BID since hospital discharge however medication is not listed on patient's discharge medication list.  Discussed with patient that furosemide was not included on his discharge medication list but patient  reports he thought he was told to take furosemide when he was discharged.    2.  Medication adherence:  Patient reports compliance with medications; however he reports taking hydralazine 50mg  BID instead of TID as prescribed.  Counseled patient to take all medications as prescribed.     Plan: 1.  Medication review:  All medications appear to be appropriate based on patient's problem list.   - Noted that patient is on warfarin for atrial fibrillation and clopidogrel for CAD with history of DES in September 2016.  Together warfarin and clopidogrel may increase the potential for a bleed.  I believe benefits outweighs risk at this point in time.  Patient confirms he is aware of signs and symptoms of bleeding.   - Patient has diabetes and is not currently on an ACEI or ARB.  Patient has allergy listed to ACE inhibitors (cough).  Please consider ordering urine microalbumin level and initiating ARB is microalbumin level is >30.  Will send a fax to patient's primary care provider's office with this information.   2.  Will contact Dr. Claris Gladden office to request clarification on furosemide.  Will follow up with patient once I receive clarification on furosemide.     Elisabeth Most, Pharm.D. Pharmacy Resident [place] [phone]

## 2015-08-31 ENCOUNTER — Other Ambulatory Visit: Payer: Self-pay | Admitting: Pharmacist

## 2015-08-31 ENCOUNTER — Encounter: Payer: Self-pay | Admitting: Pharmacist

## 2015-08-31 ENCOUNTER — Telehealth: Payer: Self-pay | Admitting: Cardiology

## 2015-08-31 NOTE — Telephone Encounter (Signed)
I attempted to reach patient at number

## 2015-08-31 NOTE — Telephone Encounter (Signed)
Follow Up    Pt c/o medication issue: 1. Name of Medication: furosemide 2. How are you currently taking this medication (dosage and times per day)?  twice a day  4. What is your medication issue? [facility] called stated that the the pt is taking furosemide twice a day. This is not on the discharge list request a call back to discuss if the pt is supposed to continue to take this medication.

## 2015-08-31 NOTE — Telephone Encounter (Signed)
Attempted to reach patient at number listed in the chart.  He was unavailable at this time.

## 2015-08-31 NOTE — Telephone Encounter (Signed)
Please clarify if patient should still be taking lasix, not on discharge summary.

## 2015-08-31 NOTE — Telephone Encounter (Signed)
Cut the Lasix back to once a day.

## 2015-08-31 NOTE — Patient Outreach (Signed)
[place] [facility]) Care Management  08/31/2015  Nahom Doupe [birth year] [id number]   Care coordination:  I received a phone call from Rodena Piety at Dr. Claris Gladden office who reports Dr. Aundra Dubin has recommended that the patient reduce furosemide to 40mg  once daily.  I made outreach call to patient to update him.  There was no answer.  I left patient a HIPAA compliant voicemail for patient to return my phone call.  Will make another outreach call to patient on 09/01/15 if he does not return my call.     Elisabeth Most, Pharm.D. Pharmacy Resident [place] [phone]

## 2015-08-31 NOTE — Telephone Encounter (Signed)
Spoke w/Rachel at [facility] and notified her that Dr. Aundra Dubin advised to cut the Lasix 40 mg to once a day.  She will advise the patient.

## 2015-09-01 ENCOUNTER — Other Ambulatory Visit: Payer: Self-pay | Admitting: Pharmacist

## 2015-09-01 ENCOUNTER — Ambulatory Visit (INDEPENDENT_AMBULATORY_CARE_PROVIDER_SITE_OTHER): Payer: Medicare Other | Admitting: *Deleted

## 2015-09-01 DIAGNOSIS — I4891 Unspecified atrial fibrillation: Secondary | ICD-10-CM

## 2015-09-01 DIAGNOSIS — I10 Essential (primary) hypertension: Secondary | ICD-10-CM | POA: Diagnosis not present

## 2015-09-01 DIAGNOSIS — Z7901 Long term (current) use of anticoagulants: Secondary | ICD-10-CM

## 2015-09-01 DIAGNOSIS — Z5181 Encounter for therapeutic drug level monitoring: Secondary | ICD-10-CM | POA: Diagnosis not present

## 2015-09-01 LAB — POCT INR: INR: 3.2

## 2015-09-01 MED ORDER — WARFARIN SODIUM 5 MG PO TABS
ORAL_TABLET | ORAL | Status: DC
Start: 2015-09-01 — End: 2016-05-25

## 2015-09-01 NOTE — Patient Outreach (Signed)
[place] [facility]) Care Management  09/01/2015  [name] [name] [birth year] [id number]   Care coordination:  I received a phone call from Rodena Piety at Dr. Claris Gladden office who reports Dr. Aundra Dubin has recommended that the patient reduce furosemide to 40mg  once daily.  I made outreach call to patent to update him.  Patient voiced understanding.  Patient reports compliance with all medications and confirms he is now taking hydralazine 50mg  TID as prescribed (patient was previously taking BID).  Patient reports he has not made an appointment with his primary care provider yet but plans to call them within the next hour.  Counseled patient on importance of making follow up appointment with his PCP.  Patient inquired about where he could get a home blood pressure cuff.  I counseled patient that he can get a cuff from his local pharmacy.  Patient reports difficulty affording blood pressure cuff due to fixed income.  Will provide patient a blood pressure cuff through [facility].  Patient denies any further questions or concerns regarding his medications at this time.  Will close pharmacy program.  Provided patient with my phone number and encouraged him to call me if he has any questions or concerns regarding his medications.     Elisabeth Most, Pharm.D. Pharmacy Resident [place] [phone]

## 2015-09-04 NOTE — Progress Notes (Signed)
Cardiology Office Note:    Date:  09/05/2015   ID:  [name] [name], DOB 1956/04/10, MRN [id number]  PCP:  Angelica Chessman, MD  Cardiologist:  Dr. Loralie Champagne   Electrophysiologist:  n/a  Chief Complaint  Patient presents with  . Hospitalization Follow-up    admx with CHF in setting of AFlutter >> s/p DCCV  . Congestive Heart Failure  . Atrial Flutter    History of Present Illness:    [name] [name] is a 60 y.o. male with a hx of HTN, DM, PAF, CAD, CKD. LHC in 5/13 demonstrated severe distal and branch vessel disease. There was no target for PCI. Disease was felt to be responsible for CP and med Rx was continued. He had improvement with Ranolazine in the past. Echo in 5/13 demonstrated normal LVF. Admitted in 5/14 with hypertensive urgency. He ran out of medications. Cardiac MRI in 2014 demonstrated EF 44%. In 10/2013 he had an episode of AFib with RVR. Echo 2/16 demonstrated EF 55-60% and moderate diastolic dysfunction. Myoview in 9/16 demonstrated inferior ischemia. He underwent right and left-sided heart catheterization. Filling pressures were not significantly elevated. He did have severe distal RCA stenosis demonstrated that was treated with DES 2. Repeat echo 9/16 demonstrated EF 55-60% and moderate diastolic dysfunction.   Admitted 10/16 with acute cholecystitis s/p cholecystotomy tube. Hospitalization complicated by acute kidney injury. ARB and Lasix were held.  Last seen by Dr. Aundra Dubin 12/16. He remained off of amlodipine, carvedilol, Lasix, hydralazine, isosorbide.  Of note patient ideally should remain on Plavix plus Coumadin through 9/17 (12 months post PCI). However, he could come off Plavix at 6 months (after March) briefly for cholecystectomy. Carvedilol and amlodipine were resumed. He was kept off of hydralazine and losartan.  Admitted 1/9-1/11 with acute on chronic diastolic CHF in the setting of atrial flutter. He was diuresed. He underwent DCCV with  restoration of NSR.  He was discharged on carvedilol, isosorbide/hydralazine, spironolactone. He returns for follow-up.   Past Medical History  Diagnosis Date  . Hypertension   . Gout   . PAF (paroxysmal atrial fibrillation) (HCC)     On coumadin  . LIVER FUNCTION TESTS, ABNORMAL, HX OF 12/30/2006  . Rectal bleeding 12/18/2011    Scheduled for colonoscopy.    [name] HEPATITIS B, CHRONIC 12/30/2006  . HYPERCHOLESTEROLEMIA 07/11/2010  . MITRAL REGURGITATION 12/30/2006  . COLONIC POLYPS, HX OF 12/30/2006  . CAD (coronary artery disease), native coronary artery     a. Nonobstructive CAD by cath 2013 - diffuse distal and branch vessel CAD, no severe disease in the major coronaries, LV mild global hypokinesis, EF 45%. b. ETT-Sestamibi 5/14: EF 31%, small fixed inferior defect with no ischemia.  . Chronic CHF ([place])     a. Mixed ICM/NICM (?EtOH). EF 35% in 2008. Echo 5/13: EF 60-65%, mod LVH, EF 45% on V gram in 12/2011. EF 12/2012: EF 50-55%, mild LVH, inferobasal HK, mild MR. ETT-Ses 5/14 EF 41%. Cardiac MRI 5/14: EF 44%, mild global HK, subepicardial delayed enhancement in nonspecific RV insertion pattern.  . H/O atrial flutter 2007    a. Ablations in 2007, 2008.  [name] History of medication noncompliance   . History of alcohol abuse   . 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 Heart murmur   . Anginal pain ([place])   . Sleep apnea     "suppose to send mask but they never did" (05/03/2015)  . Type II diabetes mellitus ([place])   . Left sciatic  nerve pain since 04/2015  . Anxiety   . Cholecystitis   1. DM2 2. Gout 3. HTN: Cough with ACEI.  4. Cardiomyopathy: Suspect mixed ischemic/nonischemic (?ETOH-related). EF 35% in 2008. Echo in 5/13 showed EF 60-65% with moderate LVH but EF was 45% on LV-gram in 5/13. Echo (5/14) with EF 50-55%, mild LVH, inferobasal HK, mild MR. ETT-Sestamibi in 5/14, however, showed EF 31%. Cardiac MRI (6/14) with EF 44%, mild global hypokinesis, subepicardial delayed  enhancement in a nonspecific RV insertion site pattern. Echo (2/16) with EF 55-60%, moderate diastolic dysfunction. RHC (9/16) with mean RA 8, PA 39/20 mean 28, mean PCWP 14, CI 2.2. Echo (9/16) with EF 55-60%, grade II diastolic dysfunction.  5. CAD: LHC in 2010 with mild nonobstructive disease. LHC (5/13) with diffuse distal and branch vessel disease, mild global hypokinesis and EF 45%. ETT-Sestamibi (5/14): EF 31%, small fixed inferior defect with no ischemia. Lexiscan Cardiolite (9/16) with EF 26%, moderate inferior defect that was partially reversible, suggesting ischemia => High risk study. LHC (9/16) with 40-50% mLAD, diffuse up to 50% distal LAD, 90% ostium of branch off ramus, 95% dRCA => DES to RCA x 2, EF 55-60%.  6. H/o chronic HBV 7. Osteochondroma left shoulder.  8. Hyperlipidemia 9. Paroxysmal atrial fibrillation: Coumadin. Developed cough and increased ESR with amiodarone.  10. Atrial flutter: had ablations in 2007 and 2008.  11. OSA on CPAP.   Past Surgical History  Procedure Laterality Date  . Osteochondroma excision Left 1972    "took bone tumor off my shoulder"  . Coronary angioplasty  12/05/01  . A flutter ablation  2007, 2008    catheter ablation   . Left heart catheterization with coronary angiogram N/A 12/19/2011    Procedure: LEFT HEART CATHETERIZATION WITH CORONARY ANGIOGRAM;  Surgeon: Larey Dresser, MD;  Location: [facility] CATH LAB;  Service: Cardiovascular;  Laterality: N/A;  . Cardiac catheterization N/A 05/03/2015    Procedure: Right/Left Heart Cath and Coronary Angiography;  Surgeon: Larey Dresser, MD;  Location: [place] CV LAB;  Service: Cardiovascular;  Laterality: N/A;  . Cardiac catheterization N/A 05/03/2015    Procedure: Coronary Stent Intervention;  Surgeon: Jettie Booze, MD;  Location: [place] CV LAB;  Service: Cardiovascular;  Laterality: N/A;  . Cardioversion N/A 08/24/2015    Procedure: CARDIOVERSION;  Surgeon: Thayer Headings, MD;   Location: [facility] ENDOSCOPY;  Service: Cardiovascular;  Laterality: N/A;    Current Medications: Outpatient Prescriptions Prior to Visit  Medication Sig Dispense Refill  . acetaminophen (TYLENOL) 500 MG tablet Take 1,000 mg by mouth every 6 (six) hours as needed (pain). Reported on 08/18/2015    . atorvastatin (LIPITOR) 80 MG tablet Take 1 tablet (80 mg total) by mouth daily. 90 tablet 3  . carvedilol (COREG) 12.5 MG tablet Take 1 tablet (12.5 mg total) by mouth 2 (two) times daily with a meal. 60 tablet 11  . clopidogrel (PLAVIX) 75 MG tablet Take 1 tablet (75 mg total) by mouth daily with breakfast. 30 tablet 6  . furosemide (LASIX) 40 MG tablet Take 1 tablet (40 mg total) by mouth daily. 90 tablet 3  . hydrALAZINE (APRESOLINE) 50 MG tablet Take 1 tablet (50 mg total) by mouth 3 (three) times daily. 270 tablet 0  . Insulin Glargine (LANTUS SOLOSTAR) 100 UNIT/ML Solostar Pen Inject 65 Units into the skin every morning. And pen needles 1/day 10 pen PRN  . isosorbide mononitrate (IMDUR) 60 MG 24 hr tablet Take 1 tablet (60 mg total)  by mouth daily. 90 tablet 0  . metFORMIN (GLUCOPHAGE) 1000 MG tablet Take 1 tablet (1,000 mg total) by mouth 2 (two) times daily with a meal. 180 tablet 1  . nitroGLYCERIN (NITROSTAT) 0.4 MG SL tablet Place 0.4 mg under the tongue every 5 (five) minutes as needed for chest pain (x 3 tabs daily). Reported on 08/18/2015    . spironolactone (ALDACTONE) 25 MG tablet Take 1 tablet (25 mg total) by mouth daily. 30 tablet 6  . warfarin (COUMADIN) 5 MG tablet Take as directed by coumadin clinic 90 tablet 1   No facility-administered medications prior to visit.     Allergies:   Other and Ace inhibitors   Social History   Social History  . Marital Status: Widowed    Spouse Name: N/A  . Number of Children: 2  . Years of Education: N/A   Occupational History  .     Social History Main Topics  . Smoking status: Former Smoker -- 4.00 packs/day for 18 years    Types:  Cigarettes    Quit date: 08/14/1983  . Smokeless tobacco: Never Used  . Alcohol Use: 9.6 oz/week    6 Cans of beer, 10 Shots of liquor per week     Comment: 05/03/2015 "1-2 beers q 3 days; 4-5 shots of liquor twice/wk"  . Drug Use: Yes    Special: Marijuana     Comment: 05/03/2015 "twice/wk maybe"  . Sexual Activity: No   Other Topics Concern  . Not on file   Social History Narrative     Family History:  The patient's family history includes Diabetes in his mother; Hypertension in his mother. There is no history of Heart attack or Stroke.   ROS:   Please see the history of present illness.    ROS All other systems reviewed and are negative.   Physical Exam:    VS:  There were no vitals taken for this visit.   GEN: Well nourished, well developed, in no acute distress HEENT: normal Neck: no JVD, no masses Cardiac: Normal S1/S2, RRR; no murmurs, rubs, or gallops, no edema;   carotid bruits,   Respiratory:  clear to auscultation bilaterally; no wheezing, rhonchi or rales GI: soft, nontender, nondistended, + BS MS: no deformity or atrophy Skin: warm and dry, no rash Neuro:  Bilateral strength equal, no focal deficits  Psych: Alert and oriented x 3, normal affect  Wt Readings from Last 3 Encounters:  08/24/15 205 lb 12.8 oz (93.35 kg)  08/18/15 217 lb (98.431 kg)  08/04/15 214 lb (97.07 kg)      Studies/Labs Reviewed:    EKG:  EKG is  ordered today.  The ekg ordered today demonstrates   Recent Labs: 04/22/2015: Pro B Natriuretic peptide (BNP) 59.0 06/04/2015: Magnesium 1.6* 07/12/2015: ALT 11 08/22/2015: B Natriuretic Peptide 331.0*; Hemoglobin 12.8*; Platelets 179 08/24/2015: BUN 18; Creatinine, Ser 1.19; Potassium 4.5; Sodium 137   Recent Lipid Panel    Component Value Date/Time   CHOL 104* 07/12/2015 0920   TRIG 78 07/12/2015 0920   HDL 44 07/12/2015 0920   CHOLHDL 2.4 07/12/2015 0920   VLDL 16 07/12/2015 0920   LDLCALC 44 07/12/2015 0920   LDLDIRECT 111.0  04/22/2015 1559    Additional studies/ records that were reviewed today include:    R/L HC 05/03/15 LM: Okay LAD: Ostial 40%, mid 40-50%, distal 50% RI: Small branch with Ostial 90%, moderate size second branch ostial 90%, proximal and mid 40% in main RI LCx:  Small, OM1 50% mid RCA: Mid 40-50%, distal 95% EF 55-60% RA mean 8, RV 36/80, PA 39/20, mean 28, PCWP mean 14 PCI: 2.5 x 16 Synergy DES to the distal RCA, 3 x 12 Synergy DES to the proximal RCA  Myoview 9/16 Partially reversible (SDS 4) moderate-sized and intensity inferior perfusion defect, which suggests ischemia, however patient motion noted during the study and mis-registration artifact is possible. Dilated LV with severe hypokinesis - LVEF 26%. This is a high risk study.  Echo 9/16 Mild LVH, EF 55-60%, normal wall motion, grade 2 diastolic dysfunction, trivial AI, mild MR, moderate LAE, normal RVSF, mild TR  LHC (5/13):  LHC (5/13) with diffuse distal and branch vessel disease, mild global hypokinesis and EF 45%  Echo (5/14):  inferbasal HK, mild LVH, EF 50-55%, trivial AI, mild MR, mild LAE  Nuclear (5/14):  Inf defect c/w thinning vs infarct, no ischemia, EF 31%; High Risk  cMRI (5/14):  Mild LVH, EF 44%, normal RV size and function, small areas of subepicardial delayed enhancement of the mid anteroseptal and mid inferoseptal RV insertion sites-nonspecific finding    ASSESSMENT:    1. Paroxysmal atrial fibrillation (HCC)   2. Coronary artery disease involving native coronary artery of native heart without angina pectoris   3. Essential hypertension   4. Hyperlipidemia   5. Cardiomyopathy ([place])   6. OSA (obstructive sleep apnea)   7. Calculus of gallbladder with acute cholecystitis without obstruction   8. Chronic diastolic CHF (congestive heart failure) (HCC)     PLAN:    In order of problems listed above:  1. PAF/Flutter - s/p recent admit with volume excess in setting of AFlutter s/p DCCV.    2.  CAD: s/p DES x 2 to the RCA.  He will remain on Plavix and Coumadin 12 months post PCI.  He could come off Plavix at 6 mos (after March 2017) if needed for cholecystectomy.   Continue amlodipine, statin, beta blocker, Plavix, nitrates.  3. Hypertension:   4. Hyperlipidemia: Continue statin.  5. Cardiomyopathy: Cardiac MRI 2014 demonstrated EF 47%. Recent Myoview with EF 26%. Follow-up echocardiogram demonstrated EF 55-60%. Low EF at nuclear study was felt to be inaccurate.  6. OSA: Continue CPAP.   7. Cholecystitis: Follow-up with general surgery as planned. Hopefully, cholecystectomy can be put off until much later. He will need to remain on Plavix without interruption given recent DES placement for at least one year.  8. Chronic Diastolic CHF:     Medication Adjustments/Labs and Tests Ordered: Current medicines are reviewed at length with the patient today.  Concerns regarding medicines are outlined above.  Medication changes, Labs and Tests ordered today are outlined in the Patient Instructions noted below. There are no Patient Instructions on file for this visit.   Signed, Richardson Dopp, PA-C  09/05/2015 8:14 AM    [facility] [place], [place], [place]  [address] Phone: [phone]; Fax: [phone]     This encounter was created in error - please disregard.

## 2015-09-05 ENCOUNTER — Encounter: Payer: Medicare Other | Admitting: Physician Assistant

## 2015-09-07 ENCOUNTER — Encounter: Payer: Self-pay | Admitting: Physician Assistant

## 2015-09-29 ENCOUNTER — Encounter: Payer: Self-pay | Admitting: *Deleted

## 2015-10-06 DIAGNOSIS — Z7901 Long term (current) use of anticoagulants: Secondary | ICD-10-CM | POA: Diagnosis not present

## 2015-10-06 DIAGNOSIS — E1165 Type 2 diabetes mellitus with hyperglycemia: Secondary | ICD-10-CM | POA: Diagnosis not present

## 2015-10-06 DIAGNOSIS — I480 Paroxysmal atrial fibrillation: Secondary | ICD-10-CM | POA: Diagnosis not present

## 2015-10-06 DIAGNOSIS — K8000 Calculus of gallbladder with acute cholecystitis without obstruction: Secondary | ICD-10-CM | POA: Diagnosis not present

## 2015-10-06 DIAGNOSIS — Z434 Encounter for attention to other artificial openings of digestive tract: Secondary | ICD-10-CM | POA: Diagnosis not present

## 2015-10-06 DIAGNOSIS — I5032 Chronic diastolic (congestive) heart failure: Secondary | ICD-10-CM | POA: Diagnosis not present

## 2015-10-06 DIAGNOSIS — I10 Essential (primary) hypertension: Secondary | ICD-10-CM | POA: Diagnosis not present

## 2015-10-07 ENCOUNTER — Ambulatory Visit: Payer: Medicare Other | Admitting: Cardiology

## 2015-10-10 ENCOUNTER — Telehealth: Payer: Self-pay | Admitting: Cardiology

## 2015-10-10 NOTE — Telephone Encounter (Signed)
Routed to MD

## 2015-10-10 NOTE — Telephone Encounter (Signed)
May hold Plavix/coumadin at 6 months post-PCI which would be 4/17. Restart after procedure.  Missed his appointment in the office next week, should be evaluated in office before formal clearance is given.

## 2015-10-10 NOTE — Telephone Encounter (Signed)
New message    Request for surgical clearance:  1. What type of surgery is being performed? Lap coli  When is this surgery scheduled? Unknown  2. Are there any medications that need to be held prior to surgery and how long?  Plavix and coumadin  Name of physician performing surgery? Dr. Redmond Pulling 3. What is your office phone and fax number?    [phone]     Fax   [phone]

## 2015-10-11 NOTE — Progress Notes (Signed)
Cardiology Office Note:    Date:  10/12/2015   ID:  [name] [name], DOB Mar 11, 1956, MRN [id number]  PCP:  Angelica Chessman, MD  Cardiologist:  Dr. Loralie Champagne   Electrophysiologist:  N/a Gen Surgeon: Dr. Redmond Pulling  Chief Complaint  Patient presents with  . Surgical Clearance    History of Present Illness:     [name] [name] is a 60 y.o. male with a hx of HTN, DM, PAF, CAD, CKD. LHC in 5/13 demonstrated severe distal and branch vessel disease. There was no target for PCI. Disease was felt to be responsible for CP and med Rx was continued. He had improvement with Ranolazine in the past. Echo in 5/13 demonstrated normal LVF. Admitted in 5/14 with hypertensive urgency. He ran out of medications. Cardiac MRI in 2014 demonstrated EF 44%. In 10/2013 he had an episode of AFib with RVR. Echo 2/16 demonstrated EF 55-60% and moderate diastolic dysfunction. Myoview in 9/16 demonstrated inferior ischemia. He underwent right and left-sided heart catheterization. Filling pressures were not significantly elevated. He did have severe distal RCA stenosis demonstrated that was treated with DES 2. Repeat echo 9/16 demonstrated EF 55-60% and moderate diastolic dysfunction.   Admitted 10/16 with acute cholecystitis s/p cholecystotomy tube. Hospitalization complicated by acute kidney injury. ARB and Lasix were held. Last seen by Dr. Aundra Dubin 12/16. He remained off of amlodipine, carvedilol, Lasix, hydralazine, isosorbide. Of note patient ideally should remain on Plavix plus Coumadin through 9/17 (12 months post PCI). However, he could come off Plavix at 6 months (after March) briefly for cholecystectomy. Carvedilol and amlodipine were resumed. He was kept off of hydralazine and losartan.  Admitted [id number] with a/c diastolic CHF in the setting of atrial flutter. He was diuresed and underwent DCCV with restoration of NSR. He was discharged on carvedilol, isosorbide/hydralazine, spironolactone.   He returns  for follow-up.  We were contacted recently regarding clearance for laparoscopic cholecystectomy.  On 10/10/15, Dr. Loralie Champagne recommended:  "May hold Plavix/coumadin at 6 months post-PCI which would be 4/17. Restart after procedure. Missed his appointment in the office next week, should be evaluated in office before formal clearance is given."  Here alone today. He notes occasional right-sided chest discomfort. He describes this as pressure. He has noticed it for the last month. He experiences it with exertion as well as at rest. He can exert himself without symptoms. Denies any radiating symptoms, associated nausea or diaphoresis. He denies chest pain with lying supine. He denies pleuritic chest pain. He denies chest pain related to meals. He does note dyspnea with exertion. Overall he is NYHA 2b. He never has felt like his breathing improved after his admission in January for atrial flutter and diastolic heart failure. His weight fluctuates between 213-217. He occasionally takes Lasix and feels better. He does note increased abdominal girth. He sleeps on 3 pillows chronically. He has had episodes of PND in the past but nothing recently. Denies LE edema.   Past Medical History  Diagnosis Date  . Hypertension   . Gout   . PAF (paroxysmal atrial fibrillation) (HCC)     On coumadin  . LIVER FUNCTION TESTS, ABNORMAL, HX OF 12/30/2006  . Rectal bleeding 12/18/2011    Scheduled for colonoscopy.    [name] HEPATITIS B, CHRONIC 12/30/2006  . HYPERCHOLESTEROLEMIA 07/11/2010  . MITRAL REGURGITATION 12/30/2006  . COLONIC POLYPS, HX OF 12/30/2006  . CAD (coronary artery disease), native coronary artery     a. Nonobstructive CAD by cath 2013 - diffuse distal and branch  vessel CAD, no severe disease in the major coronaries, LV mild global hypokinesis, EF 45%. b. ETT-Sestamibi 5/14: EF 31%, small fixed inferior defect with no ischemia.  . Chronic CHF ([place])     a. Mixed ICM/NICM (?EtOH). EF 35% in 2008. Echo 5/13: EF  60-65%, mod LVH, EF 45% on V gram in 12/2011. EF 12/2012: EF 50-55%, mild LVH, inferobasal HK, mild MR. ETT-Ses 5/14 EF 41%. Cardiac MRI 5/14: EF 44%, mild global HK, subepicardial delayed enhancement in nonspecific RV insertion pattern.  . H/O atrial flutter 2007    a. Ablations in 2007, 2008.  [name] History of medication noncompliance   . History of alcohol abuse   . 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 Heart murmur   . Anginal pain ([place])   . Sleep apnea     "suppose to send mask but they never did" (05/03/2015)  . Type II diabetes mellitus ([place])   . Left sciatic nerve pain since 04/2015  . Anxiety   . Cholecystitis     Past Surgical History  Procedure Laterality Date  . Osteochondroma excision Left 1972    "took bone tumor off my shoulder"  . Coronary angioplasty  12/05/01  . A flutter ablation  2007, 2008    catheter ablation   . Left heart catheterization with coronary angiogram N/A 12/19/2011    Procedure: LEFT HEART CATHETERIZATION WITH CORONARY ANGIOGRAM;  Surgeon: Larey Dresser, MD;  Location: [facility] CATH LAB;  Service: Cardiovascular;  Laterality: N/A;  . Cardiac catheterization N/A 05/03/2015    Procedure: Right/Left Heart Cath and Coronary Angiography;  Surgeon: Larey Dresser, MD;  Location: [facility] CV LAB;  Service: Cardiovascular;  Laterality: N/A;  . Cardiac catheterization N/A 05/03/2015    Procedure: Coronary Stent Intervention;  Surgeon: Jettie Booze, MD;  Location: [facility] CV LAB;  Service: Cardiovascular;  Laterality: N/A;  . Cardioversion N/A 08/24/2015    Procedure: CARDIOVERSION;  Surgeon: Thayer Headings, MD;  Location: [facility] ENDOSCOPY;  Service: Cardiovascular;  Laterality: N/A;    Current Medications: Outpatient Prescriptions Prior to Visit  Medication Sig Dispense Refill  . acetaminophen (TYLENOL) 500 MG tablet Take 1,000 mg by mouth every 6 (six) hours as needed (pain). Reported on 08/18/2015    . atorvastatin (LIPITOR) 80 MG tablet Take 1  tablet (80 mg total) by mouth daily. 90 tablet 3  . carvedilol (COREG) 12.5 MG tablet Take 1 tablet (12.5 mg total) by mouth 2 (two) times daily with a meal. 60 tablet 11  . clopidogrel (PLAVIX) 75 MG tablet Take 1 tablet (75 mg total) by mouth daily with breakfast. 30 tablet 6  . furosemide (LASIX) 40 MG tablet Take 60 mg by mouth daily. Take 1 and 1/2 tabs daily 90 tablet 3  . hydrALAZINE (APRESOLINE) 50 MG tablet Take 1 tablet (50 mg total) by mouth 3 (three) times daily. 270 tablet 0  . Insulin Glargine (LANTUS SOLOSTAR) 100 UNIT/ML Solostar Pen Inject 65 Units into the skin every morning. And pen needles 1/day 10 pen PRN  . metFORMIN (GLUCOPHAGE) 1000 MG tablet Take 1 tablet (1,000 mg total) by mouth 2 (two) times daily with a meal. 180 tablet 1  . nitroGLYCERIN (NITROSTAT) 0.4 MG SL tablet Place 0.4 mg under the tongue every 5 (five) minutes as needed for chest pain (x 3 tabs daily). Reported on 08/18/2015    . spironolactone (ALDACTONE) 25 MG tablet Take 1 tablet (25 mg total) by mouth daily. 30 tablet  6  . warfarin (COUMADIN) 5 MG tablet Take as directed by coumadin clinic 90 tablet 1  . isosorbide mononitrate (IMDUR) 60 MG 24 hr tablet Take 1 tablet (60 mg total) by mouth daily. 90 tablet 0   No facility-administered medications prior to visit.     Allergies:   Other and Ace inhibitors   Social History   Social History  . Marital Status: Widowed    Spouse Name: N/A  . Number of Children: 2  . Years of Education: N/A   Occupational History  .     Social History Main Topics  . Smoking status: Former Smoker -- 4.00 packs/day for 18 years    Types: Cigarettes    Quit date: 08/14/1983  . Smokeless tobacco: Never Used  . Alcohol Use: 9.6 oz/week    6 Cans of beer, 10 Shots of liquor per week     Comment: 05/03/2015 "1-2 beers q 3 days; 4-5 shots of liquor twice/wk"  . Drug Use: Yes    Special: Marijuana     Comment: 05/03/2015 "twice/wk maybe"  . Sexual Activity: No   Other  Topics Concern  . None   Social History Narrative     Family History:  The patient's family history includes Diabetes in his mother; Hypertension in his mother. There is no history of Heart attack or Stroke.   ROS:   Please see the history of present illness.    Review of Systems  Constitution: Positive for malaise/fatigue.  Cardiovascular: Positive for dyspnea on exertion.  Respiratory: Positive for snoring.   Gastrointestinal: Positive for diarrhea.  All other systems reviewed and are negative.   Physical Exam:    VS:  Ht 6' (1.829 m)  Wt 215 lb (97.523 kg)  BMI 29.15 kg/m2   GEN: Well nourished, well developed, in no acute distress HEENT: normal Neck: no JVD, no masses Cardiac: Normal S1/S2, RRR; no murmurs,  no edema;     Respiratory:  clear to auscultation bilaterally; no wheezing, rhonchi or rales GI: soft, cholecystostomy tube in place MS: no deformity or atrophy Skin: warm and dry  Neuro:  no focal deficits  Psych: Alert and oriented x 3, normal affect  Wt Readings from Last 3 Encounters:  10/12/15 215 lb (97.523 kg)  08/24/15 205 lb 12.8 oz (93.35 kg)  08/18/15 217 lb (98.431 kg)      Studies/Labs Reviewed:     EKG:  EKG is  ordered today.  The ekg ordered today demonstrates NSR, HR 81, sinus arrhythmia, LVH, nonspecific ST-T wave changes, QTC 439 ms, similar to prior tracings  Recent Labs: 04/22/2015: Pro B Natriuretic peptide (BNP) 59.0 06/04/2015: Magnesium 1.6* 07/12/2015: ALT 11 08/22/2015: B Natriuretic Peptide 331.0*; Hemoglobin 12.8*; Platelets 179 08/24/2015: BUN 18; Creatinine, Ser 1.19; Potassium 4.5; Sodium 137   Recent Lipid Panel    Component Value Date/Time   CHOL 104* 07/12/2015 0920   TRIG 78 07/12/2015 0920   HDL 44 07/12/2015 0920   CHOLHDL 2.4 07/12/2015 0920   VLDL 16 07/12/2015 0920   LDLCALC 44 07/12/2015 0920   LDLDIRECT 111.0 04/22/2015 1559    Additional studies/ records that were reviewed today include:   R/L HC  05/03/15 LM: Okay LAD: Ostial 40%, mid 40-50%, distal 50% RI: Small branch with Ostial 90%, moderate size second branch ostial 90%, proximal and mid 40% in main RI LCx: Small, OM1 50% mid RCA: Mid 40-50%, distal 95% EF 55-60% RA mean 8, RV 36/80, PA 39/20, mean 28, PCWP  mean 14 PCI: 2.5 x 16 Synergy DES to the distal RCA, 3 x 12 Synergy DES to the proximal RCA  Myoview 9/16 Partially reversible (SDS 4) moderate-sized and intensity inferior perfusion defect, which suggests ischemia, however patient motion noted during the study and mis-registration artifact is possible. Dilated LV with severe hypokinesis - LVEF 26%. This is a high risk study.  Echo 9/16 Mild LVH, EF 55-60%, normal wall motion, grade 2 diastolic dysfunction, trivial AI, mild MR, moderate LAE, normal RVSF, mild TR  LHC (5/13):  LHC (5/13) with diffuse distal and branch vessel disease, mild global hypokinesis and EF 45%  Echo (5/14):  inferbasal HK, mild LVH, EF 50-55%, trivial AI, mild MR, mild LAE  Nuclear (5/14):  Inf defect c/w thinning vs infarct, no ischemia, EF 31%; High Risk  cMRI (5/14):  Mild LVH, EF 44%, normal RV size and function, small areas of subepicardial delayed enhancement of the mid anteroseptal and mid inferoseptal RV insertion sites-nonspecific finding    ASSESSMENT:     1. Paroxysmal atrial fibrillation (HCC)   2. Coronary artery disease involving native coronary artery of native heart with angina pectoris ([place])   3. Essential hypertension   4. NICM (nonischemic cardiomyopathy) ([place])   5. Pre-operative cardiovascular examination   6. Chronic diastolic CHF (congestive heart failure) (HCC)     PLAN:     In order of problems listed above:  1. PAF/Flutter - s/p admit 1/16 with volume excess in setting of AFlutter s/p DCCV. Maintaining NSR. He has no hx of CVA. When he is able to have surgery, he can hold Coumadin without need for Lovenox bridge.   2. CAD - s/p DES x 2 to the RCA. He  will remain on Plavix and Coumadin 12 months post PCI. He could come off Plavix at 6 mos (in April 2017) if needed for cholecystectomy. He currently reports chest discomfort. This is somewhat atypical. He does note dyspnea with exertion. I suspect that some of this is related to volume excess and uncontrolled blood pressure. He was previously on amlodipine. He stopped taking isosorbide secondary to GI upset.   -  Continue statin, beta blocker, Plavix.  -  Add Amlodipine 5 mg QD  -  Adjust diuretics (increase Lasix 40 bid x 3 days, then 60 QD)  -  FU with me in 2 weeks.  If continued CP and SOB >> get Nuc stress test  3. Hypertension - Uncontrolled.  Add Amlodipine as noted.  HR a little fast.  Could increase beta-blocker if needed.   4. Cardiomyopathy - Cardiac MRI 2014 demonstrated EF 47%. Recent Myoview with EF 26%. Follow-up echocardiogram demonstrated EF 55-60%. Low EF at nuclear study was felt to be inaccurate.  5. Surgical Clearance - As noted, he is having chest pain and dyspnea.  I will increase Lasix and resume Amlodipine. FU with me in 2 weeks.  If his symptoms continue, will need nuclear stress test to assess for high risk features.    6. Chronic Diastolic CHF - As noted, I suspect his dyspnea is related to volume excess.  Increase Lasix to 40 mg bid x 3 days, then 60 mg QD.  BMET, BNP today. BMET 1 week.     Medication Adjustments/Labs and Tests Ordered: Current medicines are reviewed at length with the patient today.  Concerns regarding medicines are outlined above.  Medication changes, Labs and Tests ordered today are outlined in the Patient Instructions noted below. Patient Instructions  Medication Instructions:  1.  START AMLODIPINE 5 MG DAILY; RX SENT IN  2. INCREASE LASIX TO 40 MG TWICE DAILY FOR 3 DAYS THEN AFTER THE 3 DAYS YOU WILL START ON LASIX 60 MG DAILY WHICH WILL BE 1 AND 1/2 TABS DAILY TO = 60 MG DAILY ; FOR THE NEXT 3 DAYS EAT A BANANA WITH THE INCREASED DOSE OF  LASIX  Labwork: 1. TODAY BMET, BNP  2. 10/20/15 10 AM BMET IN 1 WEEK  Testing/Procedures: NONE  Follow-Up: 10/28/15 @ 9:50 WITH SCOTT WEAVER, PAC  Any Other Special Instructions Will Be Listed Below (If Applicable).  If you need a refill on your cardiac medications before your next appointment, please call your pharmacy.   Signed, Richardson Dopp, PA-C  10/12/2015 10:10 AM    [address], [place], [place]  [address] Phone: [phone]; Fax: [phone]

## 2015-10-11 NOTE — Telephone Encounter (Signed)
Spoke with triage nurse at [facility] and informed her of what Dr. Aundra Dubin mentioned below. Advised her that pt would need to keep appt tomorrow for possible clearance. Triage nurse verbalized understanding and states that she will get message to Amy to make her aware.

## 2015-10-11 NOTE — Telephone Encounter (Signed)
Left message for pt to call back  °

## 2015-10-12 ENCOUNTER — Ambulatory Visit (INDEPENDENT_AMBULATORY_CARE_PROVIDER_SITE_OTHER): Payer: Medicare Other | Admitting: *Deleted

## 2015-10-12 ENCOUNTER — Ambulatory Visit (INDEPENDENT_AMBULATORY_CARE_PROVIDER_SITE_OTHER): Payer: Medicare Other | Admitting: Physician Assistant

## 2015-10-12 ENCOUNTER — Other Ambulatory Visit: Payer: Self-pay | Admitting: *Deleted

## 2015-10-12 ENCOUNTER — Encounter: Payer: Self-pay | Admitting: Physician Assistant

## 2015-10-12 VITALS — Ht 72.0 in | Wt 215.0 lb

## 2015-10-12 DIAGNOSIS — I10 Essential (primary) hypertension: Secondary | ICD-10-CM

## 2015-10-12 DIAGNOSIS — G4733 Obstructive sleep apnea (adult) (pediatric): Secondary | ICD-10-CM

## 2015-10-12 DIAGNOSIS — I428 Other cardiomyopathies: Secondary | ICD-10-CM

## 2015-10-12 DIAGNOSIS — I480 Paroxysmal atrial fibrillation: Secondary | ICD-10-CM

## 2015-10-12 DIAGNOSIS — I509 Heart failure, unspecified: Secondary | ICD-10-CM | POA: Diagnosis not present

## 2015-10-12 DIAGNOSIS — I5032 Chronic diastolic (congestive) heart failure: Secondary | ICD-10-CM

## 2015-10-12 DIAGNOSIS — I25119 Atherosclerotic heart disease of native coronary artery with unspecified angina pectoris: Secondary | ICD-10-CM

## 2015-10-12 DIAGNOSIS — I209 Angina pectoris, unspecified: Secondary | ICD-10-CM | POA: Diagnosis not present

## 2015-10-12 DIAGNOSIS — Z01810 Encounter for preprocedural cardiovascular examination: Secondary | ICD-10-CM

## 2015-10-12 DIAGNOSIS — Z7901 Long term (current) use of anticoagulants: Secondary | ICD-10-CM | POA: Diagnosis not present

## 2015-10-12 DIAGNOSIS — Z5181 Encounter for therapeutic drug level monitoring: Secondary | ICD-10-CM

## 2015-10-12 DIAGNOSIS — E7800 Pure hypercholesterolemia, unspecified: Secondary | ICD-10-CM | POA: Diagnosis not present

## 2015-10-12 DIAGNOSIS — I2510 Atherosclerotic heart disease of native coronary artery without angina pectoris: Secondary | ICD-10-CM | POA: Diagnosis not present

## 2015-10-12 DIAGNOSIS — I429 Cardiomyopathy, unspecified: Secondary | ICD-10-CM | POA: Diagnosis not present

## 2015-10-12 DIAGNOSIS — Z9861 Coronary angioplasty status: Secondary | ICD-10-CM

## 2015-10-12 DIAGNOSIS — I4891 Unspecified atrial fibrillation: Secondary | ICD-10-CM

## 2015-10-12 LAB — BASIC METABOLIC PANEL
BUN: 15 mg/dL (ref 7–25)
CHLORIDE: 104 mmol/L (ref 98–110)
CO2: 21 mmol/L (ref 20–31)
Calcium: 9.2 mg/dL (ref 8.6–10.3)
Creat: 1.14 mg/dL (ref 0.70–1.25)
Glucose, Bld: 184 mg/dL — ABNORMAL HIGH (ref 65–99)
POTASSIUM: 3.8 mmol/L (ref 3.5–5.3)
Sodium: 137 mmol/L (ref 135–146)

## 2015-10-12 LAB — POCT INR: INR: 3.4

## 2015-10-12 LAB — BRAIN NATRIURETIC PEPTIDE: BRAIN NATRIURETIC PEPTIDE: 45.5 pg/mL (ref <100)

## 2015-10-12 MED ORDER — AMLODIPINE BESYLATE 5 MG PO TABS: 5.0000 mg | ORAL_TABLET | Freq: Every day | ORAL | Status: DC

## 2015-10-12 NOTE — Addendum Note (Signed)
Addended by: Michae Kava on: 10/12/2015 10:13 AM   Modules accepted: Orders

## 2015-10-12 NOTE — Patient Instructions (Addendum)
Medication Instructions:  1. START AMLODIPINE 5 MG DAILY; RX SENT IN  2. INCREASE LASIX TO 40 MG TWICE DAILY FOR 3 DAYS THEN AFTER THE 3 DAYS YOU WILL START ON LASIX 60 MG DAILY WHICH WILL BE 1 AND 1/2 TABS DAILY TO = 60 MG DAILY ; FOR THE NEXT 3 DAYS EAT A BANANA WITH THE INCREASED DOSE OF LASIX  Labwork: 1. TODAY BMET, BNP  2. 10/20/15 10 AM BMET IN 1 WEEK  Testing/Procedures: NONE  Follow-Up: 10/28/15 @ 9:50 WITH SCOTT WEAVER, PAC  Any Other Special Instructions Will Be Listed Below (If Applicable).  If you need a refill on your cardiac medications before your next appointment, please call your pharmacy.

## 2015-10-12 NOTE — Addendum Note (Signed)
Addended by: Michae Kava on: 10/12/2015 10:23 AM   Modules accepted: Orders

## 2015-10-12 NOTE — Addendum Note (Signed)
Addended by: Eulis Foster on: 10/12/2015 10:39 AM   Modules accepted: Orders

## 2015-10-12 NOTE — Addendum Note (Signed)
Addended by: Eulis Foster on: 10/12/2015 10:37 AM   Modules accepted: Orders

## 2015-10-12 NOTE — Addendum Note (Signed)
Addended by: Eulis Foster on: 10/12/2015 02:42 PM   Modules accepted: Orders

## 2015-10-12 NOTE — Addendum Note (Signed)
Addended by: Eulis Foster on: 10/12/2015 02:40 PM   Modules accepted: Orders

## 2015-10-12 NOTE — Addendum Note (Signed)
Addended by: Michae Kava on: 10/12/2015 10:17 AM   Modules accepted: Orders

## 2015-10-13 ENCOUNTER — Telehealth: Payer: Self-pay | Admitting: *Deleted

## 2015-10-13 MED ORDER — POTASSIUM CHLORIDE CRYS ER 20 MEQ PO TBCR: 20.0000 meq | EXTENDED_RELEASE_TABLET | Freq: Every day | ORAL | Status: DC

## 2015-10-13 NOTE — Telephone Encounter (Signed)
Pt notified of lab results and recommendation to start K+ 20 meq daily due to increase dose of lasix. Pt states he still has some K+ 20 meq daily. Pt already scheduled for bmet 3/9.

## 2015-10-20 ENCOUNTER — Other Ambulatory Visit (INDEPENDENT_AMBULATORY_CARE_PROVIDER_SITE_OTHER): Payer: Medicare Other | Admitting: *Deleted

## 2015-10-20 DIAGNOSIS — I5032 Chronic diastolic (congestive) heart failure: Secondary | ICD-10-CM | POA: Diagnosis not present

## 2015-10-20 DIAGNOSIS — I428 Other cardiomyopathies: Secondary | ICD-10-CM

## 2015-10-20 DIAGNOSIS — I10 Essential (primary) hypertension: Secondary | ICD-10-CM

## 2015-10-20 DIAGNOSIS — I480 Paroxysmal atrial fibrillation: Secondary | ICD-10-CM

## 2015-10-20 DIAGNOSIS — Z01810 Encounter for preprocedural cardiovascular examination: Secondary | ICD-10-CM

## 2015-10-20 DIAGNOSIS — I509 Heart failure, unspecified: Secondary | ICD-10-CM | POA: Diagnosis not present

## 2015-10-20 DIAGNOSIS — I209 Angina pectoris, unspecified: Secondary | ICD-10-CM | POA: Diagnosis not present

## 2015-10-20 DIAGNOSIS — I25119 Atherosclerotic heart disease of native coronary artery with unspecified angina pectoris: Secondary | ICD-10-CM

## 2015-10-20 DIAGNOSIS — I429 Cardiomyopathy, unspecified: Secondary | ICD-10-CM | POA: Diagnosis not present

## 2015-10-20 DIAGNOSIS — I2510 Atherosclerotic heart disease of native coronary artery without angina pectoris: Secondary | ICD-10-CM | POA: Diagnosis not present

## 2015-10-20 DIAGNOSIS — I4891 Unspecified atrial fibrillation: Secondary | ICD-10-CM | POA: Diagnosis not present

## 2015-10-20 LAB — BASIC METABOLIC PANEL
BUN: 16 mg/dL (ref 7–25)
CHLORIDE: 103 mmol/L (ref 98–110)
CO2: 28 mmol/L (ref 20–31)
Calcium: 9 mg/dL (ref 8.6–10.3)
Creat: 1.18 mg/dL (ref 0.70–1.25)
GLUCOSE: 181 mg/dL — AB (ref 65–99)
POTASSIUM: 3.7 mmol/L (ref 3.5–5.3)
SODIUM: 139 mmol/L (ref 135–146)

## 2015-10-25 ENCOUNTER — Ambulatory Visit: Payer: Medicare Other | Admitting: Cardiology

## 2015-10-26 ENCOUNTER — Encounter (HOSPITAL_COMMUNITY): Payer: Self-pay | Admitting: Cardiac Rehabilitation

## 2015-10-27 ENCOUNTER — Encounter (HOSPITAL_COMMUNITY)
Admission: RE | Admit: 2015-10-27 | Discharge: 2015-10-27 | Disposition: A | Discharge status: Home / Self Care | Payer: Medicare Other | Source: Ambulatory Visit | Attending: Cardiology | Admitting: Cardiology

## 2015-10-27 ENCOUNTER — Encounter (HOSPITAL_COMMUNITY): Payer: Self-pay

## 2015-10-27 VITALS — BP 124/60 | HR 83 | Ht 71.0 in | Wt 218.5 lb

## 2015-10-27 DIAGNOSIS — I509 Heart failure, unspecified: Secondary | ICD-10-CM | POA: Diagnosis not present

## 2015-10-27 DIAGNOSIS — Z955 Presence of coronary angioplasty implant and graft: Secondary | ICD-10-CM

## 2015-10-27 NOTE — Progress Notes (Signed)
Cardiac Rehab Medication Review by a Pharmacist  Does the patient  feel that his/her medications are working for him/her?  yes  Has the patient been experiencing any side effects to the medications prescribed?  yes The pt complains of a lot of GI upset. He also will sometimes experience hypoglycemia symptoms in the morning (sweating, fatigue, dizziness)  Does the patient measure his/her own blood pressure or blood glucose at home?  yes  Measures blood glucose once every morning and blood pressure daily  Does the patient have any problems obtaining medications due to transportation or finances?   no  Understanding of regimen: good Understanding of indications: good Potential of compliance: poor   Pharmacist comments: [name] [name] does not take a few of his medications. He does report forgetting to take his medications or sometimes he might take more than he's supposed to because he forgot if he took it. I advised him to try and use a pill box and/or an alarm on his phone to remember his medications. I educated him on the importance of all of his medications, what they are indicated for, and made a schedule for when to take his medications. He seems interested in doing better but needs the motivation and education on how to best accomplish compliance.  Joya San, PharmD Clinical Pharmacy Resident Pager # [phone] 10/27/2015 9:13 AM

## 2015-10-27 NOTE — Progress Notes (Signed)
Pt in for cardiac rehab orientation today,  As a part of orientation, pt will complete a 6 minute walk test.  Pt noted on monitor to have SR first degree AV Block with ST depression.  Reviewed most recent 12 lead ekg which showed SR with first degree and slight st depression.  Pre walk test BP 124/60.  At the completion of the walk test pt complained of "burning" in his chest rated 6 out of 10 bp 178/104.  Pt placed in chair.   Pain resolved to 0 with rest after 3 minutes.  Pt reports that he has this when he exerts himself.  Pt admitted that he was walking faster than his normal pace.  Reviewed medications with pt.  Pt has not started the amlodipine as prescribed by Richardson Dopp Pa on 3/1. (Isosorbide pt stopped taking due to GI discomfort).  Pt cites that when he takes all his medications it upsets his stomach. Bp rechecked 130/92 pt remains discomfort free.  Pt to talk with the pharmacist.  Asked Pharmacist to set up a plan for pt to take his medications spaced out throughout the day as able.  This will hopefully help with compliance of his medications and determine their effectiveness.  Pt verbalized understanding.  Pt has a appointment on tomorrow for f/u with Ballplay weaver PA.  Will send in basket and give pt a copy of his walk test for review at tomorrow appt. Will await clearance to begin the rehab program based upon Scott assessment,  Psychologist, clinical, BSN

## 2015-10-27 NOTE — Progress Notes (Signed)
Cardiology Office Note:    Date:  10/28/2015   ID:  [name] [name], DOB 03-09-1956, MRN [id number]  PCP:  [name] Chessman, MD  Cardiologist:  Dr. Loralie [name]   Electrophysiologist:  N/a Gen Surgeon: Dr. Redmond [name]  Chief Complaint  Patient presents with  . Coronary Artery Disease    follow up    History of Present Illness:     [name] [name] is a 60 y.o. male with a hx of HTN, DM, PAF, CAD, CKD. LHC in 5/13 demonstrated severe distal and branch vessel disease. There was no target for PCI. Disease was felt to be responsible for CP and med Rx was continued. He had improvement with Ranolazine in the past. Echo in 5/13 demonstrated normal LVF. Admitted in 5/14 with hypertensive urgency. He ran out of medications. Cardiac MRI in 2014 demonstrated EF 44%. In 10/2013 he had an episode of AFib with RVR. Echo 2/16 demonstrated EF 55-60% and moderate diastolic dysfunction. Myoview in 9/16 demonstrated inferior ischemia. He underwent right and left-sided heart catheterization. Filling pressures were not significantly elevated. He did have severe distal RCA stenosis demonstrated that was treated with DES 2. Repeat echo 9/16 demonstrated EF 55-60% and moderate diastolic dysfunction.   Admitted 10/16 with acute cholecystitis s/p cholecystotomy tube. Hospitalization complicated by acute kidney injury. ARB and Lasix were held. Last seen by Dr. Aundra [name] 12/16. He remained off of amlodipine, carvedilol, Lasix, hydralazine, isosorbide. Of note patient ideally should remain on Plavix plus Coumadin through 9/17 (12 months post PCI). However, he could come off Plavix at 6 months (after March) briefly for cholecystectomy. Carvedilol and amlodipine were resumed. He was kept off of hydralazine and losartan.  Admitted [id number] with a/c diastolic CHF in the setting of atrial flutter. He was diuresed and underwent DCCV with restoration of NSR. He was discharged on carvedilol, isosorbide/hydralazine,  spironolactone.   We were contacted recently regarding clearance for laparoscopic cholecystectomy.  On 10/10/15, Dr. Loralie [name] recommended:  "May hold Plavix/coumadin at 6 months post-PCI which would be 4/17. Restart after procedure. Missed his appointment in the office next week, should be evaluated in office before formal clearance is given."  I saw him earlier this month. He c/o occasional right-sided chest discomfort described as pressure. Symptoms occurred with exertion as well as at rest. He could exert himself without symptoms.  His weight fluctuated between 213-217. He occasionally took Lasix and felt better. He described increased abdominal girth. BP was elevated.  I placed him on Amlodipine and increased his Lasix for a few days.  He returns for close FU.  Since last seen, he has had an episode of chest burning with walking at cardiac rehab.  No other chest pain. Since I changed his meds last time, he is breathing better.  Denies cough, wheezing.  No syncope, orthopnea, PND, edema.     Past Medical History  Diagnosis Date  . Hypertension   . Gout   . PAF (paroxysmal atrial fibrillation) (HCC)     On coumadin  . LIVER FUNCTION TESTS, ABNORMAL, HX OF 12/30/2006  . Rectal bleeding 12/18/2011    Scheduled for colonoscopy.    [name] HEPATITIS B, CHRONIC 12/30/2006  . HYPERCHOLESTEROLEMIA 07/11/2010  . MITRAL REGURGITATION 12/30/2006  . COLONIC POLYPS, HX OF 12/30/2006  . CAD (coronary artery disease), native coronary artery     a. Nonobstructive CAD by cath 2013 - diffuse distal and branch vessel CAD, no severe disease in the major coronaries, LV mild global hypokinesis, EF 45%. b. ETT-Sestamibi 5/14:  EF 31%, small fixed inferior defect with no ischemia.  . Chronic CHF ([place])     a. Mixed ICM/NICM (?EtOH). EF 35% in 2008. Echo 5/13: EF 60-65%, mod LVH, EF 45% on V gram in 12/2011. EF 12/2012: EF 50-55%, mild LVH, inferobasal HK, mild MR. ETT-Ses 5/14 EF 41%. Cardiac MRI 5/14: EF 44%, mild global  HK, subepicardial delayed enhancement in nonspecific RV insertion pattern.  . H/O atrial flutter 2007    a. Ablations in 2007, 2008.  [name] History of medication noncompliance   . History of alcohol abuse   . 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 Heart murmur   . Anginal pain ([place])   . Sleep apnea     "suppose to send mask but they never did" (05/03/2015)  . Type II diabetes mellitus ([place])   . Left sciatic nerve pain since 04/2015  . Anxiety   . Cholecystitis   . Arrhythmia     Past Surgical History  Procedure Laterality Date  . Osteochondroma excision Left 1972    "took bone tumor off my shoulder"  . Coronary angioplasty  12/05/01  . A flutter ablation  2007, 2008    catheter ablation   . Left heart catheterization with coronary angiogram N/A 12/19/2011    Procedure: LEFT HEART CATHETERIZATION WITH CORONARY ANGIOGRAM;  Surgeon: [name] Dresser, MD;  Location: [facility] CATH LAB;  Service: Cardiovascular;  Laterality: N/A;  . Cardiac catheterization N/A 05/03/2015    Procedure: Right/Left Heart Cath and Coronary Angiography;  Surgeon: [name] Dresser, MD;  Location: [facility] CV LAB;  Service: Cardiovascular;  Laterality: N/A;  . Cardiac catheterization N/A 05/03/2015    Procedure: Coronary Stent Intervention;  Surgeon: [name] Booze, MD;  Location: [facility] CV LAB;  Service: Cardiovascular;  Laterality: N/A;  . Cardioversion N/A 08/24/2015    Procedure: CARDIOVERSION;  Surgeon: [name] Headings, MD;  Location: [facility] ENDOSCOPY;  Service: Cardiovascular;  Laterality: N/A;    Current Medications: Outpatient Prescriptions Prior to Visit  Medication Sig Dispense Refill  . acetaminophen (TYLENOL) 500 MG tablet Take 1,000 mg by mouth every 6 (six) hours as needed (pain). Reported on 08/18/2015    . amLODipine (NORVASC) 5 MG tablet Take 1 tablet (5 mg total) by mouth daily. 30 tablet 11  . carvedilol (COREG) 12.5 MG tablet Take 1 tablet (12.5 mg total) by mouth 2 (two) times daily  with a meal. 60 tablet 11  . clopidogrel (PLAVIX) 75 MG tablet Take 1 tablet (75 mg total) by mouth daily with breakfast. 30 tablet 6  . furosemide (LASIX) 40 MG tablet Take 40 mg by mouth 2 (two) times daily. Take 1 and 1/2 tabs daily 90 tablet 3  . hydrALAZINE (APRESOLINE) 50 MG tablet Take 1 tablet (50 mg total) by mouth 3 (three) times daily. 270 tablet 0  . metFORMIN (GLUCOPHAGE) 1000 MG tablet Take 1 tablet (1,000 mg total) by mouth 2 (two) times daily with a meal. 180 tablet 1  . nitroGLYCERIN (NITROSTAT) 0.4 MG SL tablet Place 0.4 mg under the tongue every 5 (five) minutes as needed for chest pain (x 3 tabs daily). Reported on 08/18/2015    . potassium chloride SA (K-DUR,KLOR-CON) 20 MEQ tablet Take 1 tablet (20 mEq total) by mouth daily.    [name] warfarin (COUMADIN) 5 MG tablet Take as directed by coumadin clinic 90 tablet 1  . atorvastatin (LIPITOR) 80 MG tablet Take 1 tablet (80 mg total) by mouth daily. 90 tablet  3  . spironolactone (ALDACTONE) 25 MG tablet Take 1 tablet (25 mg total) by mouth daily. 30 tablet 6  . Insulin Glargine (LANTUS SOLOSTAR) 100 UNIT/ML Solostar Pen Inject 65 Units into the skin every morning. And pen needles 1/day 10 pen PRN   No facility-administered medications prior to visit.     Allergies:   Other and Ace inhibitors   Social History   Social History  . Marital Status: Widowed    Spouse Name: N/A  . Number of Children: 2  . Years of Education: N/A   Occupational History  .     Social History Main Topics  . Smoking status: Former Smoker -- 4.00 packs/day for 18 years    Types: Cigarettes    Quit date: 08/14/1983  . Smokeless tobacco: Never Used  . Alcohol Use: 9.6 oz/week    6 Cans of beer, 10 Shots of liquor per week     Comment: 05/03/2015 "1-2 beers q 3 days; 4-5 shots of liquor twice/wk"  . Drug Use: Yes    Special: Marijuana     Comment: 05/03/2015 "twice/wk maybe"  . Sexual Activity: No   Other Topics Concern  . None   Social History  Narrative     Family History:  The patient's family history includes Diabetes in his mother; Hypertension in his mother. There is no history of Heart attack or Stroke.   ROS:   Please see the history of present illness.    Review of Systems  Cardiovascular: Positive for chest pain and dyspnea on exertion.  Respiratory: Positive for snoring.   Gastrointestinal: Positive for diarrhea.  All other systems reviewed and are negative.   Physical Exam:    VS:  BP 132/80 mmHg  Pulse 76  Ht 5' 8.5" (1.74 m)  Wt 217 lb 12.8 oz (98.793 kg)  BMI 32.63 kg/m2   GEN: Well nourished, well developed, in no acute distress HEENT: normal Neck: no JVD, no masses Cardiac: Normal S1/S2, RRR; no murmurs,  no edema;     Respiratory:  clear to auscultation bilaterally; no wheezing, rhonchi or rales GI: soft, cholecystostomy tube in place MS: no deformity or atrophy Skin: warm and dry  Neuro:  no focal deficits  Psych: Alert and oriented x 3, normal affect  Wt Readings from Last 3 Encounters:  10/28/15 217 lb 12.8 oz (98.793 kg)  10/27/15 218 lb 7.6 oz (99.1 kg)  10/12/15 215 lb (97.523 kg)      Studies/Labs Reviewed:     EKG:  EKG is  ordered today.  The ekg ordered today demonstrates NSR, HR 81, sinus arrhythmia, LVH, nonspecific ST-T wave changes, QTC 439 ms, similar to prior tracings  Recent Labs: 04/22/2015: Pro B Natriuretic peptide (BNP) 59.0 06/04/2015: Magnesium 1.6* 07/12/2015: ALT 11 08/22/2015: B Natriuretic Peptide 331.0*; Hemoglobin 12.8*; Platelets 179 10/20/2015: BUN 16; Creat 1.18; Potassium 3.7; Sodium 139   Recent Lipid Panel    Component Value Date/Time   CHOL 104* 07/12/2015 0920   TRIG 78 07/12/2015 0920   HDL 44 07/12/2015 0920   CHOLHDL 2.4 07/12/2015 0920   VLDL 16 07/12/2015 0920   LDLCALC 44 07/12/2015 0920   LDLDIRECT 111.0 04/22/2015 1559    Additional studies/ records that were reviewed today include:   R/L HC 05/03/15 LM: Okay LAD: Ostial 40%, mid 40-50%,  distal 50% RI: Small branch with Ostial 90%, moderate size second branch ostial 90%, proximal and mid 40% in main RI LCx: Small, OM1 50% mid RCA: Mid  40-50%, distal 95% EF 55-60% RA mean 8, RV 36/80, PA 39/20, mean 28, PCWP mean 14 PCI: 2.5 x 16 Synergy DES to the distal RCA, 3 x 12 Synergy DES to the proximal RCA  Myoview 9/16 Partially reversible (SDS 4) moderate-sized and intensity inferior perfusion defect, which suggests ischemia, however patient motion noted during the study and mis-registration artifact is possible. Dilated LV with severe hypokinesis - LVEF 26%. This is a high risk study.  Echo 9/16 Mild LVH, EF 55-60%, normal wall motion, grade 2 diastolic dysfunction, trivial AI, mild MR, moderate LAE, normal RVSF, mild TR  LHC (5/13):  LHC (5/13) with diffuse distal and branch vessel disease, mild global hypokinesis and EF 45%  Echo (5/14):  inferbasal HK, mild LVH, EF 50-55%, trivial AI, mild MR, mild LAE  Nuclear (5/14):  Inf defect c/w thinning vs infarct, no ischemia, EF 31%; High Risk  cMRI (5/14):  Mild LVH, EF 44%, normal RV size and function, small areas of subepicardial delayed enhancement of the mid anteroseptal and mid inferoseptal RV insertion sites-nonspecific finding    ASSESSMENT:     1. Paroxysmal atrial fibrillation (HCC)   2. Coronary artery disease involving native coronary artery of native heart with angina pectoris ([place])   3. Essential hypertension   4. Cardiomyopathy ([place])   5. Preoperative cardiovascular examination   6. Chronic diastolic heart failure (HCC)     PLAN:     In order of problems listed above:  1. PAF/Flutter - s/p admit 1/16 with volume excess in setting of AFlutter s/p DCCV. He has no hx of CVA. When he is able to have surgery, he can hold Coumadin without need for Lovenox bridge.   2. CAD - s/p DES x 2 to the RCA. He will remain on Plavix and Coumadin 12 months post PCI. He could come off Plavix at 6 mos (in April  2017) if needed for cholecystectomy. He currently reports an episode of exertional chest discomfort.  Of note, he previously stopped taking isosorbide secondary to GI upset.   -  Continue statin, beta blocker, Plavix, Amlodipine.  -  Schedule ETT-Myoview  3. Hypertension - Now controlled.     4. Cardiomyopathy - Cardiac MRI 2014 demonstrated EF 47%. Recent Myoview with EF 26%. Follow-up echocardiogram demonstrated EF 55-60%. Low EF at nuclear study was felt to be inaccurate.  5. Surgical Clearance - As noted, he is having chest pain.  ETT-Myoview will be done for risk stratification before providing clearance.     6. Chronic Diastolic CHF - NYHA [id number].  Volume stable.  Continue current Rx.     Medication Adjustments/Labs and Tests Ordered: Current medicines are reviewed at length with the patient today.  Concerns regarding medicines are outlined above.  Medication changes, Labs and Tests ordered today are outlined in the Patient Instructions noted below. Patient Instructions  Medication Instructions:  Your physician recommends that you continue on your current medications as directed. Please refer to the Current Medication list given to you today.  Labwork: NONE  Testing/Procedures: Your physician has requested that you have en exercise stress myoview. For further information please visit [email or web address]. Please follow instruction sheet, as given.  Follow-Up: KEEP YOUR APPT WITH DR. Aundra [name] 01/20/16  Any Other Special Instructions Will Be Listed Below (If Applicable).  If you need a refill on your cardiac medications before your next appointment, please call your pharmacy.   Signed, Richardson Dopp, PA-C  10/28/2015 5:24 PM    [facility]  [address] [place], [place]  [address] Phone: [phone]; Fax: [phone]

## 2015-10-27 NOTE — Progress Notes (Signed)
Cardiac Individual Treatment Plan  Patient Details  Name: [name] [name] MRN: [id number] Date of Birth: 1955/12/29 Referring Provider:  Loralie Champagne  Initial Encounter Date:       CARDIAC REHAB PHASE II ORIENTATION from 10/27/2015 in [place]   Date  10/27/15      Visit Diagnosis: Stented coronary artery  Patient's Home Medications on Admission:  Current outpatient prescriptions:  .  acetaminophen (TYLENOL) 500 MG tablet, Take 1,000 mg by mouth every 6 (six) hours as needed (pain). Reported on 08/18/2015, Disp: , Rfl:  .  amLODipine (NORVASC) 5 MG tablet, Take 1 tablet (5 mg total) by mouth daily., Disp: 30 tablet, Rfl: 11 .  atorvastatin (LIPITOR) 80 MG tablet, Take 1 tablet (80 mg total) by mouth daily., Disp: 90 tablet, Rfl: 3 .  carvedilol (COREG) 12.5 MG tablet, Take 1 tablet (12.5 mg total) by mouth 2 (two) times daily with a meal., Disp: 60 tablet, Rfl: 11 .  clopidogrel (PLAVIX) 75 MG tablet, Take 1 tablet (75 mg total) by mouth daily with breakfast., Disp: 30 tablet, Rfl: 6 .  furosemide (LASIX) 40 MG tablet, Take 40 mg by mouth 2 (two) times daily. Take 1 and 1/2 tabs daily, Disp: 90 tablet, Rfl: 3 .  hydrALAZINE (APRESOLINE) 50 MG tablet, Take 1 tablet (50 mg total) by mouth 3 (three) times daily., Disp: 270 tablet, Rfl: 0 .  Insulin Glargine (LANTUS SOLOSTAR) 100 UNIT/ML Solostar Pen, Inject 65 Units into the skin every morning. And pen needles 1/day, Disp: 10 pen, Rfl: PRN .  metFORMIN (GLUCOPHAGE) 1000 MG tablet, Take 1 tablet (1,000 mg total) by mouth 2 (two) times daily with a meal., Disp: 180 tablet, Rfl: 1 .  nitroGLYCERIN (NITROSTAT) 0.4 MG SL tablet, Place 0.4 mg under the tongue every 5 (five) minutes as needed for chest pain (x 3 tabs daily). Reported on 08/18/2015, Disp: , Rfl:  .  spironolactone (ALDACTONE) 25 MG tablet, Take 1 tablet (25 mg total) by mouth daily., Disp: 30 tablet, Rfl: 6 .  warfarin (COUMADIN) 5 MG tablet, Take as  directed by coumadin clinic, Disp: 90 tablet, Rfl: 1 .  potassium chloride SA (K-DUR,KLOR-CON) 20 MEQ tablet, Take 1 tablet (20 mEq total) by mouth daily. (Patient not taking: Reported on 10/27/2015), Disp: , Rfl:   Past Medical History: Past Medical History  Diagnosis Date  . Hypertension   . Gout   . PAF (paroxysmal atrial fibrillation) (HCC)     On coumadin  . LIVER FUNCTION TESTS, ABNORMAL, HX OF 12/30/2006  . Rectal bleeding 12/18/2011    Scheduled for colonoscopy.    [name] HEPATITIS B, CHRONIC 12/30/2006  . HYPERCHOLESTEROLEMIA 07/11/2010  . MITRAL REGURGITATION 12/30/2006  . COLONIC POLYPS, HX OF 12/30/2006  . CAD (coronary artery disease), native coronary artery     a. Nonobstructive CAD by cath 2013 - diffuse distal and branch vessel CAD, no severe disease in the major coronaries, LV mild global hypokinesis, EF 45%. b. ETT-Sestamibi 5/14: EF 31%, small fixed inferior defect with no ischemia.  . Chronic CHF ([place])     a. Mixed ICM/NICM (?EtOH). EF 35% in 2008. Echo 5/13: EF 60-65%, mod LVH, EF 45% on V gram in 12/2011. EF 12/2012: EF 50-55%, mild LVH, inferobasal HK, mild MR. ETT-Ses 5/14 EF 41%. Cardiac MRI 5/14: EF 44%, mild global HK, subepicardial delayed enhancement in nonspecific RV insertion pattern.  . H/O atrial flutter 2007    a. Ablations in 2007, 2008.  [name] History of  medication noncompliance   . History of alcohol abuse   . 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 Heart murmur   . Anginal pain ([place])   . Sleep apnea     "suppose to send mask but they never did" (05/03/2015)  . Type II diabetes mellitus ([place])   . Left sciatic nerve pain since 04/2015  . Anxiety   . Cholecystitis   . Arrhythmia     Tobacco Use: History  Smoking status  . Former Smoker -- 4.00 packs/day for 18 years  . Types: Cigarettes  . Quit date: 08/14/1983  Smokeless tobacco  . Never Used    Labs: Recent Review Flowsheet Data    Labs for ITP Cardiac and Pulmonary Rehab Latest Ref Rng  05/03/2015 05/03/2015 05/09/2015 07/12/2015 08/04/2015   Cholestrol 125 - 200 mg/dL - - - 104(L) -   LDLCALC <130 mg/dL - - - 44 -   HDL >=40 mg/dL - - - 44 -   Trlycerides <150 mg/dL - - - 78 -   Hemoglobin A1c - - - 8.5 - 6.8   HCO3 20.0 - 24.0 mEq/L 23.2 23.6 - - -   TCO2 0 - 100 mmol/L 24 25 - - -   ACIDBASEDEF 0.0 - 2.0 mmol/L 1.0 1.0 - - -   O2SAT - 70.0 68.0 - - -      Capillary Blood Glucose: Lab Results  Component Value Date   GLUCAP 111* 08/24/2015   GLUCAP 154* 08/24/2015   GLUCAP 146* 08/23/2015   GLUCAP 115* 08/23/2015   GLUCAP 138* 08/22/2015     Exercise Target Goals: Date: 10/27/15  Exercise Program Goal: Individual exercise prescription set with THRR, safety & activity barriers. Participant demonstrates ability to understand and report RPE using BORG scale, to self-measure pulse accurately, and to acknowledge the importance of the exercise prescription.  Exercise Prescription Goal: Starting with aerobic activity 30 plus minutes a day, 3 days per week for initial exercise prescription. Provide home exercise prescription and guidelines that participant acknowledges understanding prior to discharge.  Activity Barriers & Risk Stratification:     Activity Barriers & Cardiac Risk Stratification - 10/27/15 0751    Activity Barriers & Cardiac Risk Stratification   Activity Barriers Deconditioning;Muscular Weakness   Cardiac Risk Stratification High      6 Minute Walk:     6 Minute Walk      10/27/15 0810       6 Minute Walk   Phase Initial     Distance 1838 feet     Walk Time 6 minutes     # of Rest Breaks 0     MPH 3.48     METS 4.76     RPE 13     VO2 Peak 16.67     Symptoms Yes (comment)     Comments chest pain 6/10, no medication yet this morning     Resting HR 83 bpm     Resting BP 124/60 mmHg     Max Ex. HR 116 bpm     Max Ex. BP 178/104 mmHg     Pre/Post BP   Baseline BP 124/60 mmHg     6 Minute BP 178/104 mmHg     2 Minute Post BP  148/100 mmHg  130/92     Pre/Post BP? Yes        Initial Exercise Prescription:     Initial Exercise Prescription - 10/27/15 1000    Date of Initial  Exercise Prescription   Date 10/27/15   Bike   Level 1.2   Minutes 10   METs 3   NuStep   Level 3   Minutes 10   METs 2   Track   Laps 10   Minutes 10   METs 2.76   Prescription Details   Frequency (times per week) 3   Duration Progress to 30 minutes of continuous aerobic without signs/symptoms of physical distress   Intensity   THRR 40-80% of Max Heartrate 64-127   Ratings of Perceived Exertion 11-13   Progression   Progression Continue to progress workloads to maintain intensity without signs/symptoms of physical distress.   Resistance Training   Training Prescription Yes   Weight 2 lbs   Reps 10-12      Perform Capillary Blood Glucose checks as needed.  Exercise Prescription Changes:   Exercise Comments:   Discharge Exercise Prescription (Final Exercise Prescription Changes):   Nutrition:  Target Goals: Understanding of nutrition guidelines, daily intake of sodium 1500mg , cholesterol 200mg , calories 30% from fat and 7% or less from saturated fats, daily to have 5 or more servings of fruits and vegetables.  Biometrics:     Pre Biometrics - 10/27/15 1026    Pre Biometrics   Height 5\' 11"  (1.803 m)   Weight 218 lb 7.6 oz (99.1 kg)   Waist Circumference 41 inches   Hip Circumference 39 inches   Waist to Hip Ratio 1.05 %   BMI (Calculated) 30.5   Triceps Skinfold 12 mm   % Body Fat 27.6 %   Grip Strength 46 kg   Flexibility 11.5 in   Single Leg Stand 30 seconds       Nutrition Therapy Plan and Nutrition Goals:     Nutrition Therapy & Goals - 10/27/15 0946    Nutrition Therapy   Diet Diabetic, Therapeutic Lifestyle Changes   Drug/Food Interactions Coumadin/Vit K   Personal Nutrition Goals   Personal Goal #1 6-12 lb wt loss to a goal wt of 205 lb at graduation from [place], educate and counsel regarding individualized specific dietary modifications aiming towards targeted core components such as weight, hypertension, lipid management, diabetes, heart failure and other comorbidities.   Expected Outcomes Short Term Goal: Understand basic principles of dietary content, such as calories, fat, sodium, cholesterol and nutrients.;Long Term Goal: Adherence to prescribed nutrition plan.      Nutrition Discharge: Rate Your Plate Scores:   Nutrition Goals Re-Evaluation:   Psychosocial: Target Goals: Acknowledge presence or absence of depression, maximize coping skills, provide positive support system. Participant is able to verbalize types and ability to use techniques and skills needed for reducing stress and depression.  Initial Review & Psychosocial Screening:   Quality of Life Scores:     Quality of Life - 10/27/15 1113    Quality of Life Scores   Health/Function Pre 19.77 %   Socioeconomic Pre 20.42 %   Psych/Spiritual Pre 18.93 %   Family Pre 22.8 %   GLOBAL Pre 20.17 %      PHQ-9:     Recent Review Flowsheet Data    Depression screen [facility] 2/9 08/18/2015 05/26/2015   Decreased Interest 0 0   Down, Depressed, Hopeless 0 0   PHQ - 2 Score 0 0      Psychosocial Evaluation and Intervention:   Psychosocial Re-Evaluation:   Vocational Rehabilitation: Provide vocational rehab assistance to qualifying candidates.   Vocational Rehab  Evaluation & Intervention:   Education: Education Goals: Education classes will be provided on a weekly basis, covering required topics. Participant will state understanding/return demonstration of topics presented.  Learning Barriers/Preferences:     Learning Barriers/Preferences - 10/26/15 1534    Learning Barriers/Preferences   Learning Preferences Written Material;Audio;Verbal Instruction      Education Topics: Count Your Pulse:  -Group instruction provided by  verbal instruction, demonstration, patient participation and written materials to support subject.  Instructors address importance of being able to find your pulse and how to count your pulse when at home without a heart monitor.  Patients get hands on experience counting their pulse with staff help and individually.   Heart Attack, Angina, and Risk Factor Modification:  -Group instruction provided by verbal instruction, video, and written materials to support subject.  Instructors address signs and symptoms of angina and heart attacks.    Also discuss risk factors for heart disease and how to make changes to improve heart health risk factors.   Functional Fitness:  -Group instruction provided by verbal instruction, demonstration, patient participation, and written materials to support subject.  Instructors address safety measures for doing things around the house.  Discuss how to get up and down off the floor, how to pick things up properly, how to safely get out of a chair without assistance, and balance training.   Meditation and Mindfulness:  -Group instruction provided by verbal instruction, patient participation, and written materials to support subject.  Instructor addresses importance of mindfulness and meditation practice to help reduce stress and improve awareness.  Instructor also leads participants through a meditation exercise.    Stretching for Flexibility and Mobility:  -Group instruction provided by verbal instruction, patient participation, and written materials to support subject.  Instructors lead participants through series of stretches that are designed to increase flexibility thus improving mobility.  These stretches are additional exercise for major muscle groups that are typically performed during regular warm up and cool down.   Hands Only CPR Anytime:  -Group instruction provided by verbal instruction, video, patient participation and written materials to support  subject.  Instructors co-teach with AHA video for hands only CPR.  Participants get hands on experience with mannequins.   Nutrition I class: Heart Healthy Eating:  -Group instruction provided by PowerPoint slides, verbal discussion, and written materials to support subject matter. The instructor gives an explanation and review of the Therapeutic Lifestyle Changes diet recommendations, which includes a discussion on lipid goals, dietary fat, sodium, fiber, plant stanol/sterol esters, sugar, and the components of a well-balanced, healthy diet.   Nutrition II class: Lifestyle Skills:  -Group instruction provided by PowerPoint slides, verbal discussion, and written materials to support subject matter. The instructor gives an explanation and review of label reading, grocery shopping for heart health, heart healthy recipe modifications, and ways to make healthier choices when eating out.   Diabetes Question & Answer:  -Group instruction provided by PowerPoint slides, verbal discussion, and written materials to support subject matter. The instructor gives an explanation and review of diabetes co-morbidities, pre- and post-prandial blood glucose goals, pre-exercise blood glucose goals, signs, symptoms, and treatment of hypoglycemia and hyperglycemia, and foot care basics.   Diabetes Blitz:  -Group instruction provided by PowerPoint slides, verbal discussion, and written materials to support subject matter. The instructor gives an explanation and review of the physiology behind type 1 and type 2 diabetes, diabetes medications and rational behind using different medications, pre- and post-prandial blood glucose recommendations and Hemoglobin A1c  goals, diabetes diet, and exercise including blood glucose guidelines for exercising safely.    Portion Distortion:  -Group instruction provided by PowerPoint slides, verbal discussion, written materials, and food models to support subject matter. The instructor  gives an explanation of serving size versus portion size, changes in portions sizes over the last 20 years, and what consists of a serving from each food group.   Stress Management:  -Group instruction provided by verbal instruction, video, and written materials to support subject matter.  Instructors review role of stress in heart disease and how to cope with stress positively.     Exercising on Your Own:  -Group instruction provided by verbal instruction, power point, and written materials to support subject.  Instructors discuss benefits of exercise, components of exercise, frequency and intensity of exercise, and end points for exercise.  Also discuss use of nitroglycerin and activating EMS.  Review options of places to exercise outside of rehab.  Review guidelines for sex with heart disease.   Cardiac Drugs I:  -Group instruction provided by verbal instruction and written materials to support subject.  Instructor reviews cardiac drug classes: antiplatelets, anticoagulants, beta blockers, and statins.  Instructor discusses reasons, side effects, and lifestyle considerations for each drug class.   Cardiac Drugs II:  -Group instruction provided by verbal instruction and written materials to support subject.  Instructor reviews cardiac drug classes: angiotensin converting enzyme inhibitors (ACE-I), angiotensin II receptor blockers (ARBs), nitrates, and calcium channel blockers.  Instructor discusses reasons, side effects, and lifestyle considerations for each drug class.   Anatomy and Physiology of the Circulatory System:  -Group instruction provided by verbal instruction, video, and written materials to support subject.  Reviews functional anatomy of heart, how it relates to various diagnoses, and what role the heart plays in the overall system.   Knowledge Questionnaire Score:     Knowledge Questionnaire Score - 10/27/15 1113    Knowledge Questionnaire Score   Pre Score 20/24       Personal Goals and Risk Factors at Admission:     Personal Goals and Risk Factors at Admission - 10/27/15 0752    Core Components/Risk Factors/Patient Goals on Admission    Weight Management Yes   Intervention Weight Management: Develop a combined nutrition and exercise program designed to reach desired caloric intake, while maintaining appropriate intake of nutrient and fiber, sodium and fats, and appropriate energy expenditure required for the weight goal.;Weight Management: Provide education and appropriate resources to help participant work on and attain dietary goals.   Admit Weight 218 lb 7.6 oz (99.1 kg)   Goal Weight: Short Term 209 lb 7 oz (95 kg)   Goal Weight: Long Term 200 lb (90.719 kg)   Expected Outcomes Long Term: Adherence to nutrition and physical activity/exercise program aimed toward attainment of established weight goal.;Short Term: Continue to assess and modify interventions until short term weight is achieved.   Sedentary Yes  Increase stamina and cardio capacity   Intervention Provide advice, education, support and counseling about physical activity/exercise needs.;Develop an individualized exercise prescription for aerobic and resistive training based on initial evaluation findings, risk stratification, comorbidities and participant's personal goals.   Expected Outcomes Achievement of increased cardiorespiratory fitness and enhanced flexibility, muscular endurance and strength shown through measurements of functional capaciy and personal statement of participant.   Diabetes Yes   Intervention Provide education about signs/symptoms and action to take for hypo/hyperglycemia.;Provide education about proper nutrition, including hydration, and aerobic/resistive exercise prescription along with prescribed medications to achieve  blood glucose in normal ranges: Fasting glucose 65-99 mg/dL   Expected Outcomes Short Term: Participant verbalizes understanding of the  signs/symptoms and immediate care of hyper/hypoglycemia, proper foot care and importance of medication, aerobic/resistive exercise and nutrition plan for blood glucose control.;Long Term: Attainment of HbA1C < 7%.   Hypertension Yes   Intervention Provide education on lifestyle modifcations including regular physical activity/exercise, weight management, moderate sodium restriction and increased consumption of fresh fruit, vegetables, and low fat dairy, alcohol moderation, and smoking cessation.;Monitor prescription use compliance.   Expected Outcomes Short Term: Continued assessment and intervention until BP is < 140/13mm HG in hypertensive participants. < 130/81mm HG in hypertensive participants with diabetes, heart failure or chronic kidney disease.;Long Term: Maintenance of blood pressure at goal levels.   Lipids Yes   Intervention Provide education and support for participant on nutrition & aerobic/resistive exercise along with prescribed medications to achieve LDL 70mg , HDL >40mg .   Expected Outcomes Short Term: Participant states understanding of desired cholesterol values and is compliant with medications prescribed. Participant is following exercise prescription and nutrition guidelines.;Long Term: Cholesterol controlled with medications as prescribed, with individualized exercise RX and with personalized nutrition plan. Value goals: LDL < 70mg , HDL > 40 mg.      Personal Goals and Risk Factors Review:    Personal Goals Discharge (Final Personal Goals and Risk Factors Review):    ITP Comments:     ITP Comments      10/27/15 0751           ITP Comments Medical Director-Dr. Fransico Him, MD          Comments: Pt completed 6 minute walk test today. Monitor showed SR with ST depression and first degree block.  This is present on his most recent 12 lead ekg - 3/1 At the completion of his walk test, Pt complained of burning discomfort 6/10.  This resolved with rest.  Pt with  intermittent burning when performing heavy exertion at home.  Pt is non compliant to his medication regimen which includes the addition of amlodipine due to gi discomfort pt had with isosorbide.  Pt has follow up appt in the office on tomorrow - 3/17 with Richardson Dopp.  In basket sent to [facility] and pt given a copy of his walk test to take with him for the appointment.  Pt aware he must hold on the start of exercise until medically cleared to start exercise.  Stressed the importance of compliance with medically therapy as a participant in cardiac rehab. Cherre Huger, BSN

## 2015-10-28 ENCOUNTER — Telehealth (HOSPITAL_COMMUNITY): Payer: Self-pay | Admitting: *Deleted

## 2015-10-28 ENCOUNTER — Ambulatory Visit (INDEPENDENT_AMBULATORY_CARE_PROVIDER_SITE_OTHER): Payer: Medicare Other | Admitting: Physician Assistant

## 2015-10-28 ENCOUNTER — Ambulatory Visit (INDEPENDENT_AMBULATORY_CARE_PROVIDER_SITE_OTHER): Payer: Medicare Other

## 2015-10-28 ENCOUNTER — Encounter: Payer: Self-pay | Admitting: Physician Assistant

## 2015-10-28 VITALS — BP 132/80 | HR 76 | Ht 68.5 in | Wt 217.8 lb

## 2015-10-28 DIAGNOSIS — I4891 Unspecified atrial fibrillation: Secondary | ICD-10-CM | POA: Diagnosis not present

## 2015-10-28 DIAGNOSIS — I25119 Atherosclerotic heart disease of native coronary artery with unspecified angina pectoris: Secondary | ICD-10-CM | POA: Diagnosis not present

## 2015-10-28 DIAGNOSIS — I480 Paroxysmal atrial fibrillation: Secondary | ICD-10-CM | POA: Diagnosis not present

## 2015-10-28 DIAGNOSIS — I429 Cardiomyopathy, unspecified: Secondary | ICD-10-CM

## 2015-10-28 DIAGNOSIS — I5032 Chronic diastolic (congestive) heart failure: Secondary | ICD-10-CM | POA: Diagnosis not present

## 2015-10-28 DIAGNOSIS — Z01810 Encounter for preprocedural cardiovascular examination: Secondary | ICD-10-CM

## 2015-10-28 DIAGNOSIS — Z7901 Long term (current) use of anticoagulants: Secondary | ICD-10-CM | POA: Diagnosis not present

## 2015-10-28 DIAGNOSIS — Z5181 Encounter for therapeutic drug level monitoring: Secondary | ICD-10-CM

## 2015-10-28 DIAGNOSIS — I10 Essential (primary) hypertension: Secondary | ICD-10-CM

## 2015-10-28 LAB — POCT INR: INR: 3.1

## 2015-10-28 MED ORDER — SPIRONOLACTONE 25 MG PO TABS: 25.0000 mg | ORAL_TABLET | Freq: Every day | ORAL | Status: DC

## 2015-10-28 MED ORDER — ATORVASTATIN CALCIUM 80 MG PO TABS: 80.0000 mg | ORAL_TABLET | Freq: Every day | ORAL | Status: DC

## 2015-10-28 NOTE — Patient Instructions (Addendum)
Medication Instructions:  Your physician recommends that you continue on your current medications as directed. Please refer to the Current Medication list given to you today.  Labwork: NONE  Testing/Procedures: Your physician has requested that you have en exercise stress myoview. For further information please visit [email or web address]. Please follow instruction sheet, as given.  Follow-Up: KEEP YOUR APPT WITH DR. Aundra Dubin 01/20/16  Any Other Special Instructions Will Be Listed Below (If Applicable).  If you need a refill on your cardiac medications before your next appointment, please call your pharmacy.

## 2015-10-28 NOTE — Telephone Encounter (Signed)
Left message on voicemail per DPR in reference to upcoming appointment scheduled on 11/01/15 at 7:30 with detailed instructions given per Myocardial Perfusion Study Information Sheet for the test. LM to arrive 15 minutes early, and that it is imperative to arrive on time for appointment to keep from having the test rescheduled. If you need to cancel or reschedule your appointment, please call the office within 24 hours of your appointment. Failure to do so may result in a cancellation of your appointment, and a $50 no show fee. Phone number given for call back for any questions. [name] [name]

## 2015-10-28 NOTE — Telephone Encounter (Signed)
Pt seen in the office this morning by Richardson Dopp.  Pt scheduled for Stress test on Tuesday.  Advised pt to hold on exercise until stress test completed and cleared to begin cardiac rehab. Pt verbalized understanding and is in agreement of this. Cherre Huger, BSN

## 2015-10-31 ENCOUNTER — Encounter (HOSPITAL_COMMUNITY): Payer: Medicare Other

## 2015-11-01 ENCOUNTER — Telehealth: Payer: Self-pay | Admitting: *Deleted

## 2015-11-01 ENCOUNTER — Other Ambulatory Visit (INDEPENDENT_AMBULATORY_CARE_PROVIDER_SITE_OTHER): Payer: Medicare Other | Admitting: *Deleted

## 2015-11-01 ENCOUNTER — Other Ambulatory Visit: Payer: Self-pay | Admitting: *Deleted

## 2015-11-01 ENCOUNTER — Ambulatory Visit (HOSPITAL_COMMUNITY): Payer: Medicare Other | Attending: Physician Assistant

## 2015-11-01 ENCOUNTER — Ambulatory Visit (INDEPENDENT_AMBULATORY_CARE_PROVIDER_SITE_OTHER): Payer: Medicare Other | Admitting: Pharmacist

## 2015-11-01 ENCOUNTER — Encounter: Payer: Self-pay | Admitting: *Deleted

## 2015-11-01 ENCOUNTER — Encounter (HOSPITAL_COMMUNITY): Payer: Self-pay | Admitting: Cardiology

## 2015-11-01 ENCOUNTER — Encounter: Payer: Self-pay | Admitting: Physician Assistant

## 2015-11-01 DIAGNOSIS — I25119 Atherosclerotic heart disease of native coronary artery with unspecified angina pectoris: Secondary | ICD-10-CM | POA: Diagnosis not present

## 2015-11-01 DIAGNOSIS — R208 Other disturbances of skin sensation: Secondary | ICD-10-CM | POA: Insufficient documentation

## 2015-11-01 DIAGNOSIS — Z5181 Encounter for therapeutic drug level monitoring: Secondary | ICD-10-CM

## 2015-11-01 DIAGNOSIS — I4891 Unspecified atrial fibrillation: Secondary | ICD-10-CM | POA: Diagnosis not present

## 2015-11-01 DIAGNOSIS — I119 Hypertensive heart disease without heart failure: Secondary | ICD-10-CM | POA: Insufficient documentation

## 2015-11-01 DIAGNOSIS — I10 Essential (primary) hypertension: Secondary | ICD-10-CM

## 2015-11-01 DIAGNOSIS — I209 Angina pectoris, unspecified: Secondary | ICD-10-CM | POA: Diagnosis not present

## 2015-11-01 DIAGNOSIS — E119 Type 2 diabetes mellitus without complications: Secondary | ICD-10-CM | POA: Diagnosis not present

## 2015-11-01 DIAGNOSIS — Z7901 Long term (current) use of anticoagulants: Secondary | ICD-10-CM

## 2015-11-01 DIAGNOSIS — R0609 Other forms of dyspnea: Secondary | ICD-10-CM | POA: Insufficient documentation

## 2015-11-01 DIAGNOSIS — I2510 Atherosclerotic heart disease of native coronary artery without angina pectoris: Secondary | ICD-10-CM | POA: Diagnosis not present

## 2015-11-01 LAB — CBC WITH DIFFERENTIAL/PLATELET
BASOS ABS: 0.1 10*3/uL (ref 0.0–0.1)
Basophils Relative: 1 % (ref 0–1)
EOS PCT: 5 % (ref 0–5)
Eosinophils Absolute: 0.3 10*3/uL (ref 0.0–0.7)
HEMATOCRIT: 44.8 % (ref 39.0–52.0)
Hemoglobin: 14.9 g/dL (ref 13.0–17.0)
LYMPHS PCT: 38 % (ref 12–46)
Lymphs Abs: 2.1 10*3/uL (ref 0.7–4.0)
MCH: 27.2 pg (ref 26.0–34.0)
MCHC: 33.3 g/dL (ref 30.0–36.0)
MCV: 81.8 fL (ref 78.0–100.0)
MPV: 10.4 fL (ref 8.6–12.4)
Monocytes Absolute: 0.4 10*3/uL (ref 0.1–1.0)
Monocytes Relative: 7 % (ref 3–12)
NEUTROS PCT: 49 % (ref 43–77)
Neutro Abs: 2.7 10*3/uL (ref 1.7–7.7)
PLATELETS: 163 10*3/uL (ref 150–400)
RBC: 5.48 MIL/uL (ref 4.22–5.81)
RDW: 15.8 % — AB (ref 11.5–15.5)
WBC: 5.5 10*3/uL (ref 4.0–10.5)

## 2015-11-01 LAB — MYOCARDIAL PERFUSION IMAGING
CHL CUP NUCLEAR SDS: 4
CHL CUP NUCLEAR SRS: 0
CHL CUP NUCLEAR SSS: 4
CHL CUP RESTING HR STRESS: 67 {beats}/min
CSEPED: 5 min
CSEPEDS: 30 s
Estimated workload: 6.9 METS
LV dias vol: 191 mL (ref 62–150)
LV sys vol: 128 mL
MPHR: 159 {beats}/min
NUC STRESS TID: 1.08
Peak HR: 141 {beats}/min
Percent HR: 88 %
RATE: 0.4
RPE: 19

## 2015-11-01 LAB — BASIC METABOLIC PANEL
BUN: 14 mg/dL (ref 7–25)
CALCIUM: 9.2 mg/dL (ref 8.6–10.3)
CHLORIDE: 104 mmol/L (ref 98–110)
CO2: 27 mmol/L (ref 20–31)
CREATININE: 1 mg/dL (ref 0.70–1.25)
Glucose, Bld: 163 mg/dL — ABNORMAL HIGH (ref 65–99)
POTASSIUM: 4 mmol/L (ref 3.5–5.3)
Sodium: 137 mmol/L (ref 135–146)

## 2015-11-01 LAB — POCT INR: INR: 3.7

## 2015-11-01 MED ORDER — TECHNETIUM TC 99M SESTAMIBI GENERIC - CARDIOLITE
32.9000 | Freq: Once | INTRAVENOUS | Status: AC | PRN
  Administered 2015-11-01: 32.9 via INTRAVENOUS

## 2015-11-01 MED ORDER — TECHNETIUM TC 99M SESTAMIBI GENERIC - CARDIOLITE
10.6000 | Freq: Once | INTRAVENOUS | Status: AC | PRN
  Administered 2015-11-01: 11 via INTRAVENOUS

## 2015-11-01 NOTE — Progress Notes (Signed)
Patient ID: [name] [name], male   DOB: Feb 01, 1956, 60 y.o.   MRN: [id number]  Called by nuclear medicine for abnormal stress test.  Per report, he had significant ST depression and chest pain with exercise that persisted into recovery, now resolved.  Per report, "mild inferior reversible changes" on nuclear images.    At home, he has had some chest burning with exertion, no symptoms at rest.  INR 3.7 today, he is on warfarin for h/o atrial flutter.   I think that he will need cardiac cath. I discused risks/benefits with him today and he agrees to proceed.  Will have him hold coumadin now, I arranged cath slot with me on Friday morning.  He should continue Plavix.  If he has any prolonged chest pain/chest pain at rest, should go to the ER.    I discussed with patient and nuclear nurse.   Loralie Champagne 11/01/2015 11:49 AM

## 2015-11-01 NOTE — Telephone Encounter (Signed)
Spoke with pt after stress test and went over cath instructions with him.  Pt given copy of instructions.  Lab work to be done today.

## 2015-11-02 ENCOUNTER — Telehealth (HOSPITAL_COMMUNITY): Payer: Self-pay | Admitting: *Deleted

## 2015-11-02 ENCOUNTER — Encounter (HOSPITAL_COMMUNITY): Payer: Medicare Other

## 2015-11-02 ENCOUNTER — Ambulatory Visit: Payer: Medicare Other | Admitting: Endocrinology

## 2015-11-02 NOTE — Telephone Encounter (Signed)
Received in basket from [place] for cath on Friday.  Message left for pt to hold from exercise until cath is completed and cleared to start full exercise at cardiac rehab. Contact information provided. Cherre Huger, BSN

## 2015-11-03 ENCOUNTER — Telehealth: Payer: Self-pay | Admitting: Endocrinology

## 2015-11-03 NOTE — Telephone Encounter (Signed)
Patient no showed today's appt. Please advise on how to follow up. °A. No follow up necessary. °B. Follow up urgent. Contact patient immediately. °C. Follow up necessary. Contact patient and schedule visit in ___ days. °D. Follow up advised. Contact patient and schedule visit in ____weeks. ° °

## 2015-11-04 ENCOUNTER — Encounter (HOSPITAL_COMMUNITY)
Admission: RE | Disposition: A | Discharge status: Home / Self Care | Payer: Self-pay | Source: Ambulatory Visit | Attending: Cardiology

## 2015-11-04 ENCOUNTER — Encounter (HOSPITAL_COMMUNITY): Admission: RE | Admit: 2015-11-04 | Payer: Medicare Other | Source: Ambulatory Visit

## 2015-11-04 ENCOUNTER — Ambulatory Visit (HOSPITAL_COMMUNITY)
Admission: RE | Admit: 2015-11-04 | Discharge: 2015-11-04 | Disposition: A | Discharge status: Home / Self Care | Payer: Medicare Other | Source: Ambulatory Visit | Attending: Cardiology | Admitting: Cardiology

## 2015-11-04 DIAGNOSIS — Z794 Long term (current) use of insulin: Secondary | ICD-10-CM | POA: Diagnosis not present

## 2015-11-04 DIAGNOSIS — Z955 Presence of coronary angioplasty implant and graft: Secondary | ICD-10-CM | POA: Diagnosis not present

## 2015-11-04 DIAGNOSIS — Z87891 Personal history of nicotine dependence: Secondary | ICD-10-CM | POA: Diagnosis not present

## 2015-11-04 DIAGNOSIS — Z7984 Long term (current) use of oral hypoglycemic drugs: Secondary | ICD-10-CM | POA: Diagnosis not present

## 2015-11-04 DIAGNOSIS — I429 Cardiomyopathy, unspecified: Secondary | ICD-10-CM | POA: Diagnosis not present

## 2015-11-04 DIAGNOSIS — Z7902 Long term (current) use of antithrombotics/antiplatelets: Secondary | ICD-10-CM | POA: Insufficient documentation

## 2015-11-04 DIAGNOSIS — E7800 Pure hypercholesterolemia, unspecified: Secondary | ICD-10-CM | POA: Insufficient documentation

## 2015-11-04 DIAGNOSIS — I130 Hypertensive heart and chronic kidney disease with heart failure and stage 1 through stage 4 chronic kidney disease, or unspecified chronic kidney disease: Secondary | ICD-10-CM | POA: Insufficient documentation

## 2015-11-04 DIAGNOSIS — I25119 Atherosclerotic heart disease of native coronary artery with unspecified angina pectoris: Secondary | ICD-10-CM | POA: Diagnosis not present

## 2015-11-04 DIAGNOSIS — E1122 Type 2 diabetes mellitus with diabetic chronic kidney disease: Secondary | ICD-10-CM | POA: Insufficient documentation

## 2015-11-04 DIAGNOSIS — I5032 Chronic diastolic (congestive) heart failure: Secondary | ICD-10-CM | POA: Insufficient documentation

## 2015-11-04 DIAGNOSIS — F419 Anxiety disorder, unspecified: Secondary | ICD-10-CM | POA: Insufficient documentation

## 2015-11-04 DIAGNOSIS — G4730 Sleep apnea, unspecified: Secondary | ICD-10-CM | POA: Insufficient documentation

## 2015-11-04 DIAGNOSIS — Z7901 Long term (current) use of anticoagulants: Secondary | ICD-10-CM | POA: Diagnosis not present

## 2015-11-04 DIAGNOSIS — E1165 Type 2 diabetes mellitus with hyperglycemia: Secondary | ICD-10-CM | POA: Insufficient documentation

## 2015-11-04 DIAGNOSIS — N189 Chronic kidney disease, unspecified: Secondary | ICD-10-CM | POA: Insufficient documentation

## 2015-11-04 DIAGNOSIS — Z86010 Personal history of colonic polyps: Secondary | ICD-10-CM | POA: Insufficient documentation

## 2015-11-04 DIAGNOSIS — I480 Paroxysmal atrial fibrillation: Secondary | ICD-10-CM | POA: Insufficient documentation

## 2015-11-04 DIAGNOSIS — M109 Gout, unspecified: Secondary | ICD-10-CM | POA: Insufficient documentation

## 2015-11-04 DIAGNOSIS — B181 Chronic viral hepatitis B without delta-agent: Secondary | ICD-10-CM | POA: Insufficient documentation

## 2015-11-04 DIAGNOSIS — F1290 Cannabis use, unspecified, uncomplicated: Secondary | ICD-10-CM | POA: Insufficient documentation

## 2015-11-04 DIAGNOSIS — R9439 Abnormal result of other cardiovascular function study: Secondary | ICD-10-CM | POA: Diagnosis present

## 2015-11-04 DIAGNOSIS — I2510 Atherosclerotic heart disease of native coronary artery without angina pectoris: Secondary | ICD-10-CM | POA: Diagnosis not present

## 2015-11-04 HISTORY — PX: CARDIAC CATHETERIZATION: SHX172

## 2015-11-04 LAB — GLUCOSE, CAPILLARY
GLUCOSE-CAPILLARY: 153 mg/dL — AB (ref 65–99)
GLUCOSE-CAPILLARY: 115 mg/dL — AB (ref 65–99)

## 2015-11-04 LAB — PROTIME-INR
INR: 1.73 — ABNORMAL HIGH (ref 0.00–1.49)
Prothrombin Time: 20.2 seconds — ABNORMAL HIGH (ref 11.6–15.2)

## 2015-11-04 SURGERY — LEFT HEART CATH AND CORONARY ANGIOGRAPHY
Anesthesia: LOCAL

## 2015-11-04 MED ORDER — ASPIRIN 81 MG PO CHEW
CHEWABLE_TABLET | ORAL | Status: AC
  Administered 2015-11-04: 81 mg via ORAL
  Filled 2015-11-04: qty 1

## 2015-11-04 MED ORDER — HEPARIN SODIUM (PORCINE) 1000 UNIT/ML IJ SOLN
INTRAMUSCULAR | Status: DC | PRN
  Administered 2015-11-04: 5000 [IU] via INTRAVENOUS

## 2015-11-04 MED ORDER — VERAPAMIL HCL 2.5 MG/ML IV SOLN
INTRAVENOUS | Status: AC
  Filled 2015-11-04: qty 2

## 2015-11-04 MED ORDER — SODIUM CHLORIDE 0.9% FLUSH: 3.0000 mL | Freq: Two times a day (BID) | INTRAVENOUS | Status: DC

## 2015-11-04 MED ORDER — IOPAMIDOL (ISOVUE-370) INJECTION 76%
INTRAVENOUS | Status: AC
  Filled 2015-11-04: qty 100

## 2015-11-04 MED ORDER — SODIUM CHLORIDE 0.9 % WEIGHT BASED INFUSION: 1.0000 mL/kg/h | INTRAVENOUS | Status: DC

## 2015-11-04 MED ORDER — SODIUM CHLORIDE 0.9 % IV SOLN: 250.0000 mL | INTRAVENOUS | Status: DC | PRN

## 2015-11-04 MED ORDER — ASPIRIN 81 MG PO CHEW
81.0000 mg | CHEWABLE_TABLET | ORAL | Status: AC
  Administered 2015-11-04: 81 mg via ORAL

## 2015-11-04 MED ORDER — LIDOCAINE HCL (PF) 1 % IJ SOLN
INTRAMUSCULAR | Status: AC
  Filled 2015-11-04: qty 30

## 2015-11-04 MED ORDER — IOPAMIDOL (ISOVUE-370) INJECTION 76%
INTRAVENOUS | Status: DC | PRN
  Administered 2015-11-04: 80 mL

## 2015-11-04 MED ORDER — SODIUM CHLORIDE 0.9 % WEIGHT BASED INFUSION
3.0000 mL/kg/h | INTRAVENOUS | Status: AC
  Administered 2015-11-04: 3 mL/kg/h via INTRAVENOUS

## 2015-11-04 MED ORDER — MIDAZOLAM HCL 2 MG/2ML IJ SOLN
INTRAMUSCULAR | Status: DC | PRN
  Administered 2015-11-04 (×3): 1 mg via INTRAVENOUS

## 2015-11-04 MED ORDER — SODIUM CHLORIDE 0.9% FLUSH: 3.0000 mL | INTRAVENOUS | Status: DC | PRN

## 2015-11-04 MED ORDER — HEPARIN (PORCINE) IN NACL 2-0.9 UNIT/ML-% IJ SOLN
INTRAMUSCULAR | Status: DC | PRN
  Administered 2015-11-04: 08:00:00 via INTRA_ARTERIAL

## 2015-11-04 MED ORDER — HEPARIN SODIUM (PORCINE) 1000 UNIT/ML IJ SOLN
INTRAMUSCULAR | Status: AC
  Filled 2015-11-04: qty 1

## 2015-11-04 MED ORDER — MIDAZOLAM HCL 2 MG/2ML IJ SOLN
INTRAMUSCULAR | Status: AC
  Filled 2015-11-04: qty 2

## 2015-11-04 MED ORDER — HEPARIN (PORCINE) IN NACL 2-0.9 UNIT/ML-% IJ SOLN
INTRAMUSCULAR | Status: AC
  Filled 2015-11-04: qty 1000

## 2015-11-04 MED ORDER — ONDANSETRON HCL 4 MG/2ML IJ SOLN: 4.0000 mg | Freq: Four times a day (QID) | INTRAMUSCULAR | Status: DC | PRN

## 2015-11-04 MED ORDER — FENTANYL CITRATE (PF) 100 MCG/2ML IJ SOLN
INTRAMUSCULAR | Status: DC | PRN
  Administered 2015-11-04 (×3): 25 ug via INTRAVENOUS

## 2015-11-04 MED ORDER — LIDOCAINE HCL (PF) 1 % IJ SOLN
INTRAMUSCULAR | Status: DC | PRN
  Administered 2015-11-04: 2 mL via SUBCUTANEOUS

## 2015-11-04 MED ORDER — ACETAMINOPHEN 325 MG PO TABS: 650.0000 mg | ORAL_TABLET | ORAL | Status: DC | PRN

## 2015-11-04 MED ORDER — IOPAMIDOL (ISOVUE-370) INJECTION 76%
INTRAVENOUS | Status: AC
  Filled 2015-11-04: qty 50

## 2015-11-04 MED ORDER — HEPARIN (PORCINE) IN NACL 2-0.9 UNIT/ML-% IJ SOLN
INTRAMUSCULAR | Status: DC | PRN
  Administered 2015-11-04: 1000 mL

## 2015-11-04 MED ORDER — SODIUM CHLORIDE 0.9 % WEIGHT BASED INFUSION: 3.0000 mL/kg/h | INTRAVENOUS | Status: DC

## 2015-11-04 MED ORDER — FENTANYL CITRATE (PF) 100 MCG/2ML IJ SOLN
INTRAMUSCULAR | Status: AC
  Filled 2015-11-04: qty 2

## 2015-11-04 SURGICAL SUPPLY — 10 items
CATH INFINITI 5 FR JL3.5 (CATHETERS) ×1 IMPLANT
CATH INFINITI 5FR ANG PIGTAIL (CATHETERS) ×1 IMPLANT
CATH INFINITI JR4 5F (CATHETERS) ×1 IMPLANT
DEVICE RAD COMP TR BAND LRG (VASCULAR PRODUCTS) ×2 IMPLANT
GLIDESHEATH SLEND SS 6F .021 (SHEATH) ×1 IMPLANT
KIT HEART LEFT (KITS) ×2 IMPLANT
PACK CARDIAC CATHETERIZATION (CUSTOM PROCEDURE TRAY) ×2 IMPLANT
TRANSDUCER W/STOPCOCK (MISCELLANEOUS) ×2 IMPLANT
TUBING CIL FLEX 10 FLL-RA (TUBING) ×2 IMPLANT
WIRE SAFE-T 1.5MM-J .035X260CM (WIRE) ×1 IMPLANT

## 2015-11-04 NOTE — Progress Notes (Signed)
Notified Dr Aundra Dubin INR was 1.73, per MD ok to procedure, plan to do radially

## 2015-11-04 NOTE — H&P (View-Only) (Signed)
Cardiology Office Note:    Date:  10/28/2015   ID:  [name] [name], DOB [birth year], MRN [id number]  PCP:  Angelica Chessman, MD  Cardiologist:  Dr. Loralie Champagne   Electrophysiologist:  N/a Gen Surgeon: Dr. Redmond Pulling  Chief Complaint  Patient presents with  . Coronary Artery Disease    follow up    History of Present Illness:     [name] [name] is a 60 y.o. male with a hx of HTN, DM, PAF, CAD, CKD. LHC in 5/13 demonstrated severe distal and branch vessel disease. There was no target for PCI. Disease was felt to be responsible for CP and med Rx was continued. He had improvement with Ranolazine in the past. Echo in 5/13 demonstrated normal LVF. Admitted in 5/14 with hypertensive urgency. He ran out of medications. Cardiac MRI in 2014 demonstrated EF 44%. In 10/2013 he had an episode of AFib with RVR. Echo 2/16 demonstrated EF 55-60% and moderate diastolic dysfunction. Myoview in 9/16 demonstrated inferior ischemia. He underwent right and left-sided heart catheterization. Filling pressures were not significantly elevated. He did have severe distal RCA stenosis demonstrated that was treated with DES 2. Repeat echo 9/16 demonstrated EF 55-60% and moderate diastolic dysfunction.   Admitted 10/16 with acute cholecystitis s/p cholecystotomy tube. Hospitalization complicated by acute kidney injury. ARB and Lasix were held. Last seen by Dr. Aundra Dubin 12/16. He remained off of amlodipine, carvedilol, Lasix, hydralazine, isosorbide. Of note patient ideally should remain on Plavix plus Coumadin through 9/17 (12 months post PCI). However, he could come off Plavix at 6 months (after March) briefly for cholecystectomy. Carvedilol and amlodipine were resumed. He was kept off of hydralazine and losartan.  Admitted [id number] with a/c diastolic CHF in the setting of atrial flutter. He was diuresed and underwent DCCV with restoration of NSR. He was discharged on carvedilol, isosorbide/hydralazine,  spironolactone.   We were contacted recently regarding clearance for laparoscopic cholecystectomy.  On 10/10/15, Dr. Loralie Champagne recommended:  "May hold Plavix/coumadin at 6 months post-PCI which would be 4/17. Restart after procedure. Missed his appointment in the office next week, should be evaluated in office before formal clearance is given."  I saw him earlier this month. He c/o occasional right-sided chest discomfort described as pressure. Symptoms occurred with exertion as well as at rest. He could exert himself without symptoms.  His weight fluctuated between 213-217. He occasionally took Lasix and felt better. He described increased abdominal girth. BP was elevated.  I placed him on Amlodipine and increased his Lasix for a few days.  He returns for close FU.  Since last seen, he has had an episode of chest burning with walking at cardiac rehab.  No other chest pain. Since I changed his meds last time, he is breathing better.  Denies cough, wheezing.  No syncope, orthopnea, PND, edema.     Past Medical History  Diagnosis Date  . Hypertension   . Gout   . PAF (paroxysmal atrial fibrillation) (HCC)     On coumadin  . LIVER FUNCTION TESTS, ABNORMAL, HX OF 12/30/2006  . Rectal bleeding 12/18/2011    Scheduled for colonoscopy.    [name] HEPATITIS B, CHRONIC 12/30/2006  . HYPERCHOLESTEROLEMIA 07/11/2010  . MITRAL REGURGITATION 12/30/2006  . COLONIC POLYPS, HX OF 12/30/2006  . CAD (coronary artery disease), native coronary artery     a. Nonobstructive CAD by cath 2013 - diffuse distal and branch vessel CAD, no severe disease in the major coronaries, LV mild global hypokinesis, EF 45%. b. ETT-Sestamibi 5/14:  EF 31%, small fixed inferior defect with no ischemia.  . Chronic CHF ([place])     a. Mixed ICM/NICM (?EtOH). EF 35% in 2008. Echo 5/13: EF 60-65%, mod LVH, EF 45% on V gram in 12/2011. EF 12/2012: EF 50-55%, mild LVH, inferobasal HK, mild MR. ETT-Ses 5/14 EF 41%. Cardiac MRI 5/14: EF 44%, mild global  HK, subepicardial delayed enhancement in nonspecific RV insertion pattern.  . H/O atrial flutter 2007    a. Ablations in 2007, 2008.  [name] History of medication noncompliance   . History of alcohol abuse   . 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 Heart murmur   . Anginal pain ([place])   . Sleep apnea     "suppose to send mask but they never did" (05/03/2015)  . Type II diabetes mellitus ([place])   . Left sciatic nerve pain since 04/2015  . Anxiety   . Cholecystitis   . Arrhythmia     Past Surgical History  Procedure Laterality Date  . Osteochondroma excision Left 1972    "took bone tumor off my shoulder"  . Coronary angioplasty  12/05/01  . A flutter ablation  2007, 2008    catheter ablation   . Left heart catheterization with coronary angiogram N/A 12/19/2011    Procedure: LEFT HEART CATHETERIZATION WITH CORONARY ANGIOGRAM;  Surgeon: Larey Dresser, MD;  Location: [facility] CATH LAB;  Service: Cardiovascular;  Laterality: N/A;  . Cardiac catheterization N/A 05/03/2015    Procedure: Right/Left Heart Cath and Coronary Angiography;  Surgeon: Larey Dresser, MD;  Location: [place] CV LAB;  Service: Cardiovascular;  Laterality: N/A;  . Cardiac catheterization N/A 05/03/2015    Procedure: Coronary Stent Intervention;  Surgeon: Jettie Booze, MD;  Location: [place] CV LAB;  Service: Cardiovascular;  Laterality: N/A;  . Cardioversion N/A 08/24/2015    Procedure: CARDIOVERSION;  Surgeon: Thayer Headings, MD;  Location: [facility] ENDOSCOPY;  Service: Cardiovascular;  Laterality: N/A;    Current Medications: Outpatient Prescriptions Prior to Visit  Medication Sig Dispense Refill  . acetaminophen (TYLENOL) 500 MG tablet Take 1,000 mg by mouth every 6 (six) hours as needed (pain). Reported on 08/18/2015    . amLODipine (NORVASC) 5 MG tablet Take 1 tablet (5 mg total) by mouth daily. 30 tablet 11  . carvedilol (COREG) 12.5 MG tablet Take 1 tablet (12.5 mg total) by mouth 2 (two) times daily  with a meal. 60 tablet 11  . clopidogrel (PLAVIX) 75 MG tablet Take 1 tablet (75 mg total) by mouth daily with breakfast. 30 tablet 6  . furosemide (LASIX) 40 MG tablet Take 40 mg by mouth 2 (two) times daily. Take 1 and 1/2 tabs daily 90 tablet 3  . hydrALAZINE (APRESOLINE) 50 MG tablet Take 1 tablet (50 mg total) by mouth 3 (three) times daily. 270 tablet 0  . metFORMIN (GLUCOPHAGE) 1000 MG tablet Take 1 tablet (1,000 mg total) by mouth 2 (two) times daily with a meal. 180 tablet 1  . nitroGLYCERIN (NITROSTAT) 0.4 MG SL tablet Place 0.4 mg under the tongue every 5 (five) minutes as needed for chest pain (x 3 tabs daily). Reported on 08/18/2015    . potassium chloride SA (K-DUR,KLOR-CON) 20 MEQ tablet Take 1 tablet (20 mEq total) by mouth daily.    [name] warfarin (COUMADIN) 5 MG tablet Take as directed by coumadin clinic 90 tablet 1  . atorvastatin (LIPITOR) 80 MG tablet Take 1 tablet (80 mg total) by mouth daily. 90 tablet  3  . spironolactone (ALDACTONE) 25 MG tablet Take 1 tablet (25 mg total) by mouth daily. 30 tablet 6  . Insulin Glargine (LANTUS SOLOSTAR) 100 UNIT/ML Solostar Pen Inject 65 Units into the skin every morning. And pen needles 1/day 10 pen PRN   No facility-administered medications prior to visit.     Allergies:   Other and Ace inhibitors   Social History   Social History  . Marital Status: Widowed    Spouse Name: N/A  . Number of Children: 2  . Years of Education: N/A   Occupational History  .     Social History Main Topics  . Smoking status: Former Smoker -- 4.00 packs/day for 18 years    Types: Cigarettes    Quit date: 08/14/1983  . Smokeless tobacco: Never Used  . Alcohol Use: 9.6 oz/week    6 Cans of beer, 10 Shots of liquor per week     Comment: 05/03/2015 "1-2 beers q 3 days; 4-5 shots of liquor twice/wk"  . Drug Use: Yes    Special: Marijuana     Comment: 05/03/2015 "twice/wk maybe"  . Sexual Activity: No   Other Topics Concern  . None   Social History  Narrative     Family History:  The patient's family history includes Diabetes in his mother; Hypertension in his mother. There is no history of Heart attack or Stroke.   ROS:   Please see the history of present illness.    Review of Systems  Cardiovascular: Positive for chest pain and dyspnea on exertion.  Respiratory: Positive for snoring.   Gastrointestinal: Positive for diarrhea.  All other systems reviewed and are negative.   Physical Exam:    VS:  BP 132/80 mmHg  Pulse 76  Ht 5' 8.5" (1.74 m)  Wt 217 lb 12.8 oz (98.793 kg)  BMI 32.63 kg/m2   GEN: Well nourished, well developed, in no acute distress HEENT: normal Neck: no JVD, no masses Cardiac: Normal S1/S2, RRR; no murmurs,  no edema;     Respiratory:  clear to auscultation bilaterally; no wheezing, rhonchi or rales GI: soft, cholecystostomy tube in place MS: no deformity or atrophy Skin: warm and dry  Neuro:  no focal deficits  Psych: Alert and oriented x 3, normal affect  Wt Readings from Last 3 Encounters:  10/28/15 217 lb 12.8 oz (98.793 kg)  10/27/15 218 lb 7.6 oz (99.1 kg)  10/12/15 215 lb (97.523 kg)      Studies/Labs Reviewed:     EKG:  EKG is  ordered today.  The ekg ordered today demonstrates NSR, HR 81, sinus arrhythmia, LVH, nonspecific ST-T wave changes, QTC 439 ms, similar to prior tracings  Recent Labs: 04/22/2015: Pro B Natriuretic peptide (BNP) 59.0 06/04/2015: Magnesium 1.6* 07/12/2015: ALT 11 08/22/2015: B Natriuretic Peptide 331.0*; Hemoglobin 12.8*; Platelets 179 10/20/2015: BUN 16; Creat 1.18; Potassium 3.7; Sodium 139   Recent Lipid Panel    Component Value Date/Time   CHOL 104* 07/12/2015 0920   TRIG 78 07/12/2015 0920   HDL 44 07/12/2015 0920   CHOLHDL 2.4 07/12/2015 0920   VLDL 16 07/12/2015 0920   LDLCALC 44 07/12/2015 0920   LDLDIRECT 111.0 04/22/2015 1559    Additional studies/ records that were reviewed today include:   R/L HC 05/03/15 LM: Okay LAD: Ostial 40%, mid 40-50%,  distal 50% RI: Small branch with Ostial 90%, moderate size second branch ostial 90%, proximal and mid 40% in main RI LCx: Small, OM1 50% mid RCA: Mid  40-50%, distal 95% EF 55-60% RA mean 8, RV 36/80, PA 39/20, mean 28, PCWP mean 14 PCI: 2.5 x 16 Synergy DES to the distal RCA, 3 x 12 Synergy DES to the proximal RCA  Myoview 9/16 Partially reversible (SDS 4) moderate-sized and intensity inferior perfusion defect, which suggests ischemia, however patient motion noted during the study and mis-registration artifact is possible. Dilated LV with severe hypokinesis - LVEF 26%. This is a high risk study.  Echo 9/16 Mild LVH, EF 55-60%, normal wall motion, grade 2 diastolic dysfunction, trivial AI, mild MR, moderate LAE, normal RVSF, mild TR  LHC (5/13):  LHC (5/13) with diffuse distal and branch vessel disease, mild global hypokinesis and EF 45%  Echo (5/14):  inferbasal HK, mild LVH, EF 50-55%, trivial AI, mild MR, mild LAE  Nuclear (5/14):  Inf defect c/w thinning vs infarct, no ischemia, EF 31%; High Risk  cMRI (5/14):  Mild LVH, EF 44%, normal RV size and function, small areas of subepicardial delayed enhancement of the mid anteroseptal and mid inferoseptal RV insertion sites-nonspecific finding    ASSESSMENT:     1. Paroxysmal atrial fibrillation (HCC)   2. Coronary artery disease involving native coronary artery of native heart with angina pectoris ([place])   3. Essential hypertension   4. Cardiomyopathy ([place])   5. Preoperative cardiovascular examination   6. Chronic diastolic heart failure (HCC)     PLAN:     In order of problems listed above:  1. PAF/Flutter - s/p admit 1/16 with volume excess in setting of AFlutter s/p DCCV. He has no hx of CVA. When he is able to have surgery, he can hold Coumadin without need for Lovenox bridge.   2. CAD - s/p DES x 2 to the RCA. He will remain on Plavix and Coumadin 12 months post PCI. He could come off Plavix at 6 mos (in April  2017) if needed for cholecystectomy. He currently reports an episode of exertional chest discomfort.  Of note, he previously stopped taking isosorbide secondary to GI upset.   -  Continue statin, beta blocker, Plavix, Amlodipine.  -  Schedule ETT-Myoview  3. Hypertension - Now controlled.     4. Cardiomyopathy - Cardiac MRI 2014 demonstrated EF 47%. Recent Myoview with EF 26%. Follow-up echocardiogram demonstrated EF 55-60%. Low EF at nuclear study was felt to be inaccurate.  5. Surgical Clearance - As noted, he is having chest pain.  ETT-Myoview will be done for risk stratification before providing clearance.     6. Chronic Diastolic CHF - NYHA XX123456.  Volume stable.  Continue current Rx.     Medication Adjustments/Labs and Tests Ordered: Current medicines are reviewed at length with the patient today.  Concerns regarding medicines are outlined above.  Medication changes, Labs and Tests ordered today are outlined in the Patient Instructions noted below. Patient Instructions  Medication Instructions:  Your physician recommends that you continue on your current medications as directed. Please refer to the Current Medication list given to you today.  Labwork: NONE  Testing/Procedures: Your physician has requested that you have en exercise stress myoview. For further information please visit [email or web address]. Please follow instruction sheet, as given.  Follow-Up: KEEP YOUR APPT WITH DR. Aundra Dubin 01/20/16  Any Other Special Instructions Will Be Listed Below (If Applicable).  If you need a refill on your cardiac medications before your next appointment, please call your pharmacy.   Signed, Richardson Dopp, PA-C  10/28/2015 5:24 PM    [facility]  [address] [place], [place]  [address] Phone: [phone]; Fax: [phone]

## 2015-11-04 NOTE — Telephone Encounter (Signed)
Please come back for a follow-up appointment in 6 weeks  

## 2015-11-04 NOTE — Interval H&P Note (Signed)
Cath Lab Visit (complete for each Cath Lab visit)  Clinical Evaluation Leading to the Procedure:   ACS: No.  Non-ACS:    Anginal Classification: CCS III  Anti-ischemic medical therapy: Minimal Therapy (1 class of medications)  Non-Invasive Test Results: Intermediate-risk stress test findings: cardiac mortality 1-3%/year  Prior CABG: No previous CABG      History and Physical Interval Note:  11/04/2015 7:47 AM  [name] [name]  has presented today for surgery, with the diagnosis of chf  The various methods of treatment have been discussed with the patient and family. After consideration of risks, benefits and other options for treatment, the patient has consented to  Procedure(s): Left Heart Cath and Coronary Angiography (N/A) as a surgical intervention .  The patient's history has been reviewed, patient examined, no change in status, stable for surgery.  I have reviewed the patient's chart and labs.  Questions were answered to the patient's satisfaction.     [name] [name] [employer]

## 2015-11-04 NOTE — Discharge Instructions (Signed)
Radial Site Care °Refer to this sheet in the next few weeks. These instructions provide you with information about caring for yourself after your procedure. Your health care provider may also give you more specific instructions. Your treatment has been planned according to current medical practices, but problems sometimes occur. Call your health care provider if you have any problems or questions after your procedure. °WHAT TO EXPECT AFTER THE PROCEDURE °After your procedure, it is typical to have the following: °· Bruising at the radial site that usually fades within 1-2 weeks. °· Blood collecting in the tissue (hematoma) that may be painful to the touch. It should usually decrease in size and tenderness within 1-2 weeks. °HOME CARE INSTRUCTIONS °· Take medicines only as directed by your health care provider. °· You may shower 24-48 hours after the procedure or as directed by your health care provider. Remove the bandage (dressing) and gently wash the site with plain soap and water. Pat the area dry with a clean towel. Do not rub the site, because this may cause bleeding. °· Do not take baths, swim, or use a hot tub until your health care provider approves. °· Check your insertion site every day for redness, swelling, or drainage. °· Do not apply powder or lotion to the site. °· Do not flex or bend the affected arm for 24 hours or as directed by your health care provider. °· Do not push or pull heavy objects with the affected arm for 24 hours or as directed by your health care provider. °· Do not lift over 10 lb (4.5 kg) for 5 days after your procedure or as directed by your health care provider. °· Ask your health care provider when it is okay to: °¨ Return to work or school. °¨ Resume usual physical activities or sports. °¨ Resume sexual activity. °· Do not drive home if you are discharged the same day as the procedure. Have someone else drive you. °· You may drive 24 hours after the procedure unless otherwise  instructed by your health care provider. °· Do not operate machinery or power tools for 24 hours after the procedure. °· If your procedure was done as an outpatient procedure, which means that you went home the same day as your procedure, a responsible adult should be with you for the first 24 hours after you arrive home. °· Keep all follow-up visits as directed by your health care provider. This is important. °SEEK MEDICAL CARE IF: °· You have a fever. °· You have chills. °· You have increased bleeding from the radial site. Hold pressure on the site. °SEEK IMMEDIATE MEDICAL CARE IF: °· You have unusual pain at the radial site. °· You have redness, warmth, or swelling at the radial site. °· You have drainage (other than a small amount of blood on the dressing) from the radial site. °· The radial site is bleeding, and the bleeding does not stop after 30 minutes of holding steady pressure on the site. °· Your arm or hand becomes pale, cool, tingly, or numb. °  °This information is not intended to replace advice given to you by your health care provider. Make sure you discuss any questions you have with your health care provider. °  °Document Released: 09/01/2010 Document Revised: 08/20/2014 Document Reviewed: 02/15/2014 °Elsevier Interactive Patient Education ©2016 Elsevier Inc. ° °

## 2015-11-04 NOTE — Telephone Encounter (Signed)
Called pt, his VM is full

## 2015-11-04 NOTE — Telephone Encounter (Signed)
Caitlin,  Could you please contact the pt to reschedule. Thanks!  

## 2015-11-04 NOTE — Progress Notes (Signed)
Patient Information: 1. Patients With Known Obstructive CAD (e.g., Prior MI, Prior PCI, Prior CABG, or Obstructive Disease on Invasive Angiography) 2. Post Revascularization (PCI or CABG) 3. Intermediate-risk noninvasive findings 4. Worsening or limiting symptoms A (7) Indication: 54;  [name] [name] 11/04/2015

## 2015-11-07 ENCOUNTER — Telehealth: Payer: Self-pay | Admitting: *Deleted

## 2015-11-07 ENCOUNTER — Encounter (HOSPITAL_COMMUNITY)
Admission: RE | Admit: 2015-11-07 | Discharge: 2015-11-07 | Disposition: A | Discharge status: Home / Self Care | Payer: Medicare Other | Source: Ambulatory Visit | Attending: Cardiology | Admitting: Cardiology

## 2015-11-07 ENCOUNTER — Encounter (HOSPITAL_COMMUNITY): Payer: Self-pay | Admitting: Cardiology

## 2015-11-07 ENCOUNTER — Telehealth (HOSPITAL_COMMUNITY): Payer: Self-pay | Admitting: *Deleted

## 2015-11-07 ENCOUNTER — Telehealth: Payer: Self-pay | Admitting: Endocrinology

## 2015-11-07 DIAGNOSIS — E119 Type 2 diabetes mellitus without complications: Secondary | ICD-10-CM

## 2015-11-07 MED ORDER — METFORMIN HCL 1000 MG PO TABS: 1000.0000 mg | ORAL_TABLET | Freq: Two times a day (BID) | ORAL | Status: DC

## 2015-11-07 MED ORDER — INSULIN GLARGINE 100 UNIT/ML SOLOSTAR PEN: PEN_INJECTOR | SUBCUTANEOUS | Status: DC

## 2015-11-07 NOTE — Telephone Encounter (Signed)
Rx submitted per pt's request.  

## 2015-11-07 NOTE — Telephone Encounter (Signed)
Pt returned call.  Pt unsure about many of his medications.  Pt had not resumed the coumadin or metformin.  Pt ran out of the atorvastatin and spironolactone and was in the process of calling about this.  Asked pt if he had started the amlodipine pt had not.  Asked pt what his blood glucose was this morning.  Pt had not checked it.  Asked pt to get his meter and have it checked while rehab staff stayed on the phone. Pt stated that he could not find his "kit'. Asked pt when was the last time he checked his blood glucose he stated a couple of days ago. Pt then stated that he was out of his lantus insulin. Per progress note.  Pt informed to please resume his coumadin and metformin (after he checks his blood glucose)  Reviewed with pt his upcoming appts with Dr. Loanne Drilling on Thursday and coumadin clinic on Friday.  Pt asked could he call us back when he got himself together.  Contact information provided. Cherre Huger, BSN

## 2015-11-07 NOTE — Telephone Encounter (Signed)
-----   Message from Larey Dresser, MD sent at 11/07/2015  1:00 PM EDT ----- Regarding: RE: Resume medications - coumadin and metformin post cath 3/24 Yes, will ask nurse to call [name] [name] today to straighten out his meds.   Could triage nurse please call [name] [name] today.  Refill his atorvastatin.  Also, need to make sure he is on his other meds as ordered.  Should be back on his coumadin with coumadin clinic followup.   Please call this afternoon.   Thanks.  ----- Message -----    From: Rowe Pavy, RN    Sent: 11/07/2015   9:44 AM      To: Larey Dresser, MD, Katrine Coho, RN Subject: Resume medications - coumadin and metformin #  FYI: Called to let [name] [name] know he could restart rehab on this Friday after his appointment with Dr. Loanne Drilling and coumadin check Friday am.  [name] [name] asked when he could start taking his coumadin again.  Referred to progress note that he could resume his coumadin last Friday night.  [name] [name] had not resumed his metformin.  Since it has been 48 hours I told [name] [name] to resume his metformin (hadnt checked his blood glucose this morning couldn't find his meter to check it while we were on the phone)  Called back cbg 259. He is out of the spironolactone and atorvastatin.  No refills per his bottle for atorvastatin - he's unsure when he took it last. Could this be called in for [name] [name]??  He has refills on the spironolactone and he will call that in today.  Told [name] [name] to start his amlodipine.  He had 10mg  dose from previous time.  I told [name] [name] to break in half and to take it daily.  Thanks for helping with the atorvastatin  Carlette  Thanks Carlette

## 2015-11-07 NOTE — Telephone Encounter (Signed)
Pt called and informed that he may start cardiac rehab on Friday.  Pt also told Dr. Loanne Drilling office has been trying to get in touch with him to reschedule appointment.

## 2015-11-07 NOTE — Telephone Encounter (Signed)
-----   Message from Larey Dresser, MD sent at 11/04/2015  1:09 PM EDT ----- Regarding: RE: When may pt start cardiac Rehab? He can exercise, NTG for angina.   ----- Message -----    From: Rowe Pavy, RN    Sent: 11/04/2015  11:12 AM      To: Larey Dresser, MD Subject: When may pt start cardiac Rehab?               Dr. Aundra Dubin,  Pt attended orientation to cardiac rehab and completed 6 minute walk test with burning at the completion of the walk test.  Seen in office by [facility].  Patient placed on hold to start cardiac rehab pending results of gxt and subsequent cath today.  Based upon findings today, when may pt start cardiac rehab? Because of his diffuse disease, pt may experience chest pain here in [place].  What would be the appropriate intervention for management?  ie 12 lead ekg, ntg, O2 therapy in light of his medication noncompliance.  Thank you for your input  Maurice Small RN

## 2015-11-07 NOTE — Telephone Encounter (Signed)
Pt needs refills on metformin and lantus called into [facility] on [address]

## 2015-11-07 NOTE — Telephone Encounter (Signed)
Attempted to contact pt by phone - NA and voicemail is not accepting messages.  Both atorvastatin and spironolactone were refilled 10/28/15 by Richardson Dopp, PA #[id number] with 3 refills.  Pt just needs to call his pharmacy to let them know to get them ready for pick up.  [place]. Will continue to attempt to contact pt.

## 2015-11-08 NOTE — Telephone Encounter (Signed)
Attempted to contact by telephone today, unable to leave message or voicemail.

## 2015-11-09 ENCOUNTER — Encounter (HOSPITAL_COMMUNITY): Payer: Medicare Other

## 2015-11-10 ENCOUNTER — Ambulatory Visit (INDEPENDENT_AMBULATORY_CARE_PROVIDER_SITE_OTHER): Payer: Medicare Other | Admitting: Endocrinology

## 2015-11-10 ENCOUNTER — Encounter: Payer: Self-pay | Admitting: Endocrinology

## 2015-11-10 VITALS — BP 122/78 | HR 83 | Temp 99.3°F | Ht 68.5 in | Wt 220.0 lb

## 2015-11-10 DIAGNOSIS — E1151 Type 2 diabetes mellitus with diabetic peripheral angiopathy without gangrene: Secondary | ICD-10-CM | POA: Diagnosis not present

## 2015-11-10 DIAGNOSIS — I25119 Atherosclerotic heart disease of native coronary artery with unspecified angina pectoris: Secondary | ICD-10-CM

## 2015-11-10 DIAGNOSIS — E119 Type 2 diabetes mellitus without complications: Secondary | ICD-10-CM

## 2015-11-10 DIAGNOSIS — Z794 Long term (current) use of insulin: Secondary | ICD-10-CM

## 2015-11-10 LAB — POCT GLYCOSYLATED HEMOGLOBIN (HGB A1C): HEMOGLOBIN A1C: 8

## 2015-11-10 MED ORDER — METFORMIN HCL 1000 MG PO TABS: 1000.0000 mg | ORAL_TABLET | Freq: Two times a day (BID) | ORAL | Status: DC

## 2015-11-10 MED ORDER — INSULIN GLARGINE 100 UNIT/ML SOLOSTAR PEN: 50.0000 [IU] | PEN_INJECTOR | SUBCUTANEOUS | Status: DC

## 2015-11-10 NOTE — Patient Instructions (Addendum)
check your blood sugar twice a day.  vary the time of day when you check, between before the 3 meals, and at bedtime.  also check if you have symptoms of your blood sugar being too high or too low.  please keep a record of the readings and bring it to your next appointment here.  You can write it on any piece of paper.  please call us sooner if your blood sugar goes below 70, or if you have a lot of readings over 200.   Please reduce the lantus to 50 units each morning.  Please take this every morning, no matter what your sugar is On this type of insulin schedule, you should eat meals on a regular schedule.  If a meal is missed or significantly delayed, your blood sugar could go low.    Please continue the same metformin. Please come back for a follow-up appointment in 3 months.

## 2015-11-10 NOTE — Progress Notes (Signed)
Cardiac Individual Treatment Plan  Patient Details  Name: [name] [name] MRN: [id number] Date of Birth: Nov 06, 1955 Referring Provider:  Larey Dresser, MD  Initial Encounter Date:       CARDIAC REHAB PHASE II ORIENTATION from 10/27/2015 in [facility]   Date  10/27/15      Visit Diagnosis: No diagnosis found.  Patient's Home Medications on Admission:  Current outpatient prescriptions:  .  acetaminophen (TYLENOL) 500 MG tablet, Take 1,000 mg by mouth every 6 (six) hours as needed (pain). Reported on 08/18/2015, Disp: , Rfl:  .  amLODipine (NORVASC) 5 MG tablet, Take 1 tablet (5 mg total) by mouth daily., Disp: 30 tablet, Rfl: 11 .  atorvastatin (LIPITOR) 80 MG tablet, Take 1 tablet (80 mg total) by mouth daily., Disp: 90 tablet, Rfl: 3 .  carvedilol (COREG) 12.5 MG tablet, Take 1 tablet (12.5 mg total) by mouth 2 (two) times daily with a meal., Disp: 60 tablet, Rfl: 11 .  clopidogrel (PLAVIX) 75 MG tablet, Take 1 tablet (75 mg total) by mouth daily with breakfast., Disp: 30 tablet, Rfl: 6 .  furosemide (LASIX) 40 MG tablet, Take 60 mg by mouth daily. , Disp: 90 tablet, Rfl: 3 .  hydrALAZINE (APRESOLINE) 50 MG tablet, Take 1 tablet (50 mg total) by mouth 3 (three) times daily., Disp: 270 tablet, Rfl: 0 .  Insulin Glargine (LANTUS SOLOSTAR) 100 UNIT/ML Solostar Pen, Inject 50 Units into the skin every morning. And pen needles 1/day, Disp: 30 mL, Rfl: 2 .  metFORMIN (GLUCOPHAGE) 1000 MG tablet, Take 1 tablet (1,000 mg total) by mouth 2 (two) times daily with a meal., Disp: 180 tablet, Rfl: 3 .  nitroGLYCERIN (NITROSTAT) 0.4 MG SL tablet, Place 0.4 mg under the tongue every 5 (five) minutes as needed for chest pain (x 3 tabs daily). Reported on 08/18/2015, Disp: , Rfl:  .  potassium chloride SA (K-DUR,KLOR-CON) 20 MEQ tablet, Take 1 tablet (20 mEq total) by mouth daily., Disp: , Rfl:  .  spironolactone (ALDACTONE) 25 MG tablet, Take 1 tablet (25 mg total) by mouth  daily., Disp: 90 tablet, Rfl: 3 .  warfarin (COUMADIN) 5 MG tablet, Take as directed by coumadin clinic (Patient taking differently: Take 2.5-5 mg by mouth daily. Take 2.5mg  by mouth on Tuesday and Sunday. Take 5 mg by mouth on all other days), Disp: 90 tablet, Rfl: 1  Past Medical History: Past Medical History  Diagnosis Date  . Hypertension   . Gout   . PAF (paroxysmal atrial fibrillation) (HCC)     On coumadin  . LIVER FUNCTION TESTS, ABNORMAL, HX OF 12/30/2006  . Rectal bleeding 12/18/2011    Scheduled for colonoscopy.    [name] HEPATITIS B, CHRONIC 12/30/2006  . HYPERCHOLESTEROLEMIA 07/11/2010  . MITRAL REGURGITATION 12/30/2006  . COLONIC POLYPS, HX OF 12/30/2006  . CAD (coronary artery disease), native coronary artery     a. Nonobstructive CAD by cath 2013 - diffuse distal and branch vessel CAD, no severe disease in the major coronaries, LV mild global hypokinesis, EF 45%. b. ETT-Sestamibi 5/14: EF 31%, small fixed inferior defect with no ischemia.  . Chronic CHF ([place])     a. Mixed ICM/NICM (?EtOH). EF 35% in 2008. Echo 5/13: EF 60-65%, mod LVH, EF 45% on V gram in 12/2011. EF 12/2012: EF 50-55%, mild LVH, inferobasal HK, mild MR. ETT-Ses 5/14 EF 41%. Cardiac MRI 5/14: EF 44%, mild global HK, subepicardial delayed enhancement in nonspecific RV insertion pattern.  . H/O  atrial flutter 2007    a. Ablations in 2007, 2008.  [name] History of medication noncompliance   . History of alcohol abuse   . 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 Heart murmur   . Anginal pain ([place])   . Sleep apnea     "suppose to send mask but they never did" (05/03/2015)  . Type II diabetes mellitus ([place])   . Left sciatic nerve pain since 04/2015  . Anxiety   . Cholecystitis   . Arrhythmia   . History of nuclear stress test     Myoview 3/17: + chest pain; EF 33%, downsloping ST depression 2, V4-6, LVH with strain, no ischemia on images; intermediate risk     Tobacco Use: History  Smoking status  .  Former Smoker -- 4.00 packs/day for 18 years  . Types: Cigarettes  . Quit date: 08/14/1983  Smokeless tobacco  . Never Used    Labs: Recent Review Flowsheet Data    Labs for ITP Cardiac and Pulmonary Rehab Latest Ref Rng 05/03/2015 05/09/2015 07/12/2015 08/04/2015 11/10/2015   Cholestrol 125 - 200 mg/dL - - 104(L) - -   LDLCALC <130 mg/dL - - 44 - -   HDL >=40 mg/dL - - 44 - -   Trlycerides <150 mg/dL - - 78 - -   Hemoglobin A1c - - 8.5 - 6.8 8.0   HCO3 20.0 - 24.0 mEq/L 23.6 - - - -   TCO2 0 - 100 mmol/L 25 - - - -   ACIDBASEDEF 0.0 - 2.0 mmol/L 1.0 - - - -   O2SAT - 68.0 - - - -      Capillary Blood Glucose: Lab Results  Component Value Date   GLUCAP 115* 11/04/2015   GLUCAP 153* 11/04/2015   GLUCAP 111* 08/24/2015   GLUCAP 154* 08/24/2015   GLUCAP 146* 08/23/2015     Exercise Target Goals:    Exercise Program Goal: Individual exercise prescription set with THRR, safety & activity barriers. Participant demonstrates ability to understand and report RPE using BORG scale, to self-measure pulse accurately, and to acknowledge the importance of the exercise prescription.  Exercise Prescription Goal: Starting with aerobic activity 30 plus minutes a day, 3 days per week for initial exercise prescription. Provide home exercise prescription and guidelines that participant acknowledges understanding prior to discharge.  Activity Barriers & Risk Stratification:     Activity Barriers & Cardiac Risk Stratification - 10/27/15 0751    Activity Barriers & Cardiac Risk Stratification   Activity Barriers Deconditioning;Muscular Weakness   Cardiac Risk Stratification High      6 Minute Walk:     6 Minute Walk      10/27/15 0810       6 Minute Walk   Phase Initial     Distance 1838 feet     Walk Time 6 minutes     # of Rest Breaks 0     MPH 3.48     METS 4.76     RPE 13     VO2 Peak 16.67     Symptoms Yes (comment)     Comments chest pain 6/10, no medication yet this  morning     Resting HR 83 bpm     Resting BP 124/60 mmHg     Max Ex. HR 116 bpm     Max Ex. BP 178/104 mmHg     2 Minute Post BP 148/100 mmHg  130/92     Pre/Post BP  Baseline BP 124/60 mmHg     6 Minute BP 178/104 mmHg     Pre/Post BP? Yes        Initial Exercise Prescription:     Initial Exercise Prescription - 10/27/15 1000    Date of Initial Exercise Prescription   Date 10/27/15   Bike   Level 1.2   Minutes 10   METs 3   NuStep   Level 3   Minutes 10   METs 2   Track   Laps 10   Minutes 10   METs 2.76   Prescription Details   Frequency (times per week) 3   Duration Progress to 30 minutes of continuous aerobic without signs/symptoms of physical distress   Intensity   THRR 40-80% of Max Heartrate 64-127   Ratings of Perceived Exertion 11-13   Progression   Progression Continue to progress workloads to maintain intensity without signs/symptoms of physical distress.   Resistance Training   Training Prescription Yes   Weight 2 lbs   Reps 10-12      Perform Capillary Blood Glucose checks as needed.  Exercise Prescription Changes:   Exercise Comments:   Discharge Exercise Prescription (Final Exercise Prescription Changes):   Nutrition:  Target Goals: Understanding of nutrition guidelines, daily intake of sodium 1500mg , cholesterol 200mg , calories 30% from fat and 7% or less from saturated fats, daily to have 5 or more servings of fruits and vegetables.  Biometrics:     Pre Biometrics - 10/27/15 1026    Pre Biometrics   Height 5\' 11"  (1.803 m)   Weight 218 lb 7.6 oz (99.1 kg)   Waist Circumference 41 inches   Hip Circumference 39 inches   Waist to Hip Ratio 1.05 %   BMI (Calculated) 30.5   Triceps Skinfold 12 mm   % Body Fat 27.6 %   Grip Strength 46 kg   Flexibility 11.5 in   Single Leg Stand 30 seconds       Nutrition Therapy Plan and Nutrition Goals:     Nutrition Therapy & Goals - 10/27/15 0946    Nutrition Therapy   Diet  Diabetic, Therapeutic Lifestyle Changes   Drug/Food Interactions Coumadin/Vit K   Personal Nutrition Goals   Personal Goal #1 6-12 lb wt loss to a goal wt of 205 lb at graduation from [facility], educate and counsel regarding individualized specific dietary modifications aiming towards targeted core components such as weight, hypertension, lipid management, diabetes, heart failure and other comorbidities.   Expected Outcomes Short Term Goal: Understand basic principles of dietary content, such as calories, fat, sodium, cholesterol and nutrients.;Long Term Goal: Adherence to prescribed nutrition plan.      Nutrition Discharge: Nutrition Scores:   Nutrition Goals Re-Evaluation:   Psychosocial: Target Goals: Acknowledge presence or absence of depression, maximize coping skills, provide positive support system. Participant is able to verbalize types and ability to use techniques and skills needed for reducing stress and depression.  Initial Review & Psychosocial Screening:   Quality of Life Scores:     Quality of Life - 10/27/15 1113    Quality of Life Scores   Health/Function Pre 19.77 %   Socioeconomic Pre 20.42 %   Psych/Spiritual Pre 18.93 %   Family Pre 22.8 %   GLOBAL Pre 20.17 %      PHQ-9:     Recent Review Flowsheet Data    Depression screen [facility] 2/9 08/18/2015 05/26/2015   Decreased Interest 0  0   Down, Depressed, Hopeless 0 0   PHQ - 2 Score 0 0      Psychosocial Evaluation and Intervention:   Psychosocial Re-Evaluation:   Vocational Rehabilitation: Provide vocational rehab assistance to qualifying candidates.   Vocational Rehab Evaluation & Intervention:   Education: Education Goals: Education classes will be provided on a weekly basis, covering required topics. Participant will state understanding/return demonstration of topics presented.  Learning Barriers/Preferences:     Learning  Barriers/Preferences - 10/26/15 1534    Learning Barriers/Preferences   Learning Preferences Written Material;Audio;Verbal Instruction      Education Topics: Count Your Pulse:  -Group instruction provided by verbal instruction, demonstration, patient participation and written materials to support subject.  Instructors address importance of being able to find your pulse and how to count your pulse when at home without a heart monitor.  Patients get hands on experience counting their pulse with staff help and individually.   Heart Attack, Angina, and Risk Factor Modification:  -Group instruction provided by verbal instruction, video, and written materials to support subject.  Instructors address signs and symptoms of angina and heart attacks.    Also discuss risk factors for heart disease and how to make changes to improve heart health risk factors.   Functional Fitness:  -Group instruction provided by verbal instruction, demonstration, patient participation, and written materials to support subject.  Instructors address safety measures for doing things around the house.  Discuss how to get up and down off the floor, how to pick things up properly, how to safely get out of a chair without assistance, and balance training.   Meditation and Mindfulness:  -Group instruction provided by verbal instruction, patient participation, and written materials to support subject.  Instructor addresses importance of mindfulness and meditation practice to help reduce stress and improve awareness.  Instructor also leads participants through a meditation exercise.    Stretching for Flexibility and Mobility:  -Group instruction provided by verbal instruction, patient participation, and written materials to support subject.  Instructors lead participants through series of stretches that are designed to increase flexibility thus improving mobility.  These stretches are additional exercise for major muscle groups  that are typically performed during regular warm up and cool down.   Hands Only CPR Anytime:  -Group instruction provided by verbal instruction, video, patient participation and written materials to support subject.  Instructors co-teach with AHA video for hands only CPR.  Participants get hands on experience with mannequins.   Nutrition I class: Heart Healthy Eating:  -Group instruction provided by PowerPoint slides, verbal discussion, and written materials to support subject matter. The instructor gives an explanation and review of the Therapeutic Lifestyle Changes diet recommendations, which includes a discussion on lipid goals, dietary fat, sodium, fiber, plant stanol/sterol esters, sugar, and the components of a well-balanced, healthy diet.   Nutrition II class: Lifestyle Skills:  -Group instruction provided by PowerPoint slides, verbal discussion, and written materials to support subject matter. The instructor gives an explanation and review of label reading, grocery shopping for heart health, heart healthy recipe modifications, and ways to make healthier choices when eating out.   Diabetes Question & Answer:  -Group instruction provided by PowerPoint slides, verbal discussion, and written materials to support subject matter. The instructor gives an explanation and review of diabetes co-morbidities, pre- and post-prandial blood glucose goals, pre-exercise blood glucose goals, signs, symptoms, and treatment of hypoglycemia and hyperglycemia, and foot care basics.   Diabetes Blitz:  -Group instruction provided by PowerPoint  slides, verbal discussion, and written materials to support subject matter. The instructor gives an explanation and review of the physiology behind type 1 and type 2 diabetes, diabetes medications and rational behind using different medications, pre- and post-prandial blood glucose recommendations and Hemoglobin A1c goals, diabetes diet, and exercise including blood  glucose guidelines for exercising safely.    Portion Distortion:  -Group instruction provided by PowerPoint slides, verbal discussion, written materials, and food models to support subject matter. The instructor gives an explanation of serving size versus portion size, changes in portions sizes over the last 20 years, and what consists of a serving from each food group.   Stress Management:  -Group instruction provided by verbal instruction, video, and written materials to support subject matter.  Instructors review role of stress in heart disease and how to cope with stress positively.     Exercising on Your Own:  -Group instruction provided by verbal instruction, power point, and written materials to support subject.  Instructors discuss benefits of exercise, components of exercise, frequency and intensity of exercise, and end points for exercise.  Also discuss use of nitroglycerin and activating EMS.  Review options of places to exercise outside of rehab.  Review guidelines for sex with heart disease.   Cardiac Drugs I:  -Group instruction provided by verbal instruction and written materials to support subject.  Instructor reviews cardiac drug classes: antiplatelets, anticoagulants, beta blockers, and statins.  Instructor discusses reasons, side effects, and lifestyle considerations for each drug class.   Cardiac Drugs II:  -Group instruction provided by verbal instruction and written materials to support subject.  Instructor reviews cardiac drug classes: angiotensin converting enzyme inhibitors (ACE-I), angiotensin II receptor blockers (ARBs), nitrates, and calcium channel blockers.  Instructor discusses reasons, side effects, and lifestyle considerations for each drug class.   Anatomy and Physiology of the Circulatory System:  -Group instruction provided by verbal instruction, video, and written materials to support subject.  Reviews functional anatomy of heart, how it relates to various  diagnoses, and what role the heart plays in the overall system.   Knowledge Questionnaire Score:     Knowledge Questionnaire Score - 10/27/15 1113    Knowledge Questionnaire Score   Pre Score 20/24      Core Components/Risk Factors/Patient Goals at Admission:     Personal Goals and Risk Factors at Admission - 10/27/15 0752    Core Components/Risk Factors/Patient Goals on Admission    Weight Management Yes   Intervention Weight Management: Develop a combined nutrition and exercise program designed to reach desired caloric intake, while maintaining appropriate intake of nutrient and fiber, sodium and fats, and appropriate energy expenditure required for the weight goal.;Weight Management: Provide education and appropriate resources to help participant work on and attain dietary goals.   Admit Weight 218 lb 7.6 oz (99.1 kg)   Goal Weight: Short Term 209 lb 7 oz (95 kg)   Goal Weight: Long Term 200 lb (90.719 kg)   Expected Outcomes Long Term: Adherence to nutrition and physical activity/exercise program aimed toward attainment of established weight goal.;Short Term: Continue to assess and modify interventions until short term weight is achieved.   Sedentary Yes  Increase stamina and cardio capacity   Intervention Provide advice, education, support and counseling about physical activity/exercise needs.;Develop an individualized exercise prescription for aerobic and resistive training based on initial evaluation findings, risk stratification, comorbidities and participant's personal goals.   Expected Outcomes Achievement of increased cardiorespiratory fitness and enhanced flexibility, muscular endurance and strength shown  through measurements of functional capaciy and personal statement of participant.   Diabetes Yes   Intervention Provide education about signs/symptoms and action to take for hypo/hyperglycemia.;Provide education about proper nutrition, including hydration, and  aerobic/resistive exercise prescription along with prescribed medications to achieve blood glucose in normal ranges: Fasting glucose 65-99 mg/dL   Expected Outcomes Short Term: Participant verbalizes understanding of the signs/symptoms and immediate care of hyper/hypoglycemia, proper foot care and importance of medication, aerobic/resistive exercise and nutrition plan for blood glucose control.;Long Term: Attainment of HbA1C < 7%.   Hypertension Yes   Intervention Provide education on lifestyle modifcations including regular physical activity/exercise, weight management, moderate sodium restriction and increased consumption of fresh fruit, vegetables, and low fat dairy, alcohol moderation, and smoking cessation.;Monitor prescription use compliance.   Expected Outcomes Short Term: Continued assessment and intervention until BP is < 140/20mm HG in hypertensive participants. < 130/26mm HG in hypertensive participants with diabetes, heart failure or chronic kidney disease.;Long Term: Maintenance of blood pressure at goal levels.   Lipids Yes   Intervention Provide education and support for participant on nutrition & aerobic/resistive exercise along with prescribed medications to achieve LDL 70mg , HDL >40mg .   Expected Outcomes Short Term: Participant states understanding of desired cholesterol values and is compliant with medications prescribed. Participant is following exercise prescription and nutrition guidelines.;Long Term: Cholesterol controlled with medications as prescribed, with individualized exercise RX and with personalized nutrition plan. Value goals: LDL < 70mg , HDL > 40 mg.      Core Components/Risk Factors/Patient Goals Review:    Core Components/Risk Factors/Patient Goals at Discharge (Final Review):    ITP Comments:     ITP Comments      10/27/15 0751           ITP Comments Medical Director-Dr. Fransico Him, MD          Comments:.  Pt cardiac rehab was put on hold due to  positive stress test and pt had subsequent cardiac cath.  Cath showed patent stents and diffuse disease.  Recommended improved diabetes management and medicine compliance.  Pt due to start cardiac rehab on March 31 after he sees his endocrinologist in the office today- 3/30.  Continue to monitor. Cherre Huger, BSN

## 2015-11-10 NOTE — Progress Notes (Signed)
Subjective:    Patient ID: [name] [name], male    DOB: 10/03/1955, 60 y.o.   MRN: [id number]  HPI Pt returns for f/u of diabetes mellitus: DM type: Insulin-requiring type 2 Dx'ed: 2008.  Complications: CAD and PAD.  Therapy: insulin (since 2011) and metformin.  DKA: never.   Severe hypoglycemia: never.  Pancreatitis: never.  Other: pt takes qd insulin, due to difficulty remembering multiple daily injections.   Interval history: no cbg record, but states cbg's are seldom low, and these episodes are mild.  This happens in the fasting state.  It is highest at hs.  Pt says he skips the insulin 2-3 times per week, out of concern for hypoglycemia.   Past Medical History  Diagnosis Date  . Hypertension   . Gout   . PAF (paroxysmal atrial fibrillation) (HCC)     On coumadin  . LIVER FUNCTION TESTS, ABNORMAL, HX OF 12/30/2006  . Rectal bleeding 12/18/2011    Scheduled for colonoscopy.    [name] HEPATITIS B, CHRONIC 12/30/2006  . HYPERCHOLESTEROLEMIA 07/11/2010  . MITRAL REGURGITATION 12/30/2006  . COLONIC POLYPS, HX OF 12/30/2006  . CAD (coronary artery disease), native coronary artery     a. Nonobstructive CAD by cath 2013 - diffuse distal and branch vessel CAD, no severe disease in the major coronaries, LV mild global hypokinesis, EF 45%. b. ETT-Sestamibi 5/14: EF 31%, small fixed inferior defect with no ischemia.  . Chronic CHF ([place])     a. Mixed ICM/NICM (?EtOH). EF 35% in 2008. Echo 5/13: EF 60-65%, mod LVH, EF 45% on V gram in 12/2011. EF 12/2012: EF 50-55%, mild LVH, inferobasal HK, mild MR. ETT-Ses 5/14 EF 41%. Cardiac MRI 5/14: EF 44%, mild global HK, subepicardial delayed enhancement in nonspecific RV insertion pattern.  . H/O atrial flutter 2007    a. Ablations in 2007, 2008.  [name] History of medication noncompliance   . History of alcohol abuse   . 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 Heart murmur   . Anginal pain ([place])   . Sleep apnea     "suppose to send mask but  they never did" (05/03/2015)  . Type II diabetes mellitus ([name])   . Left sciatic nerve pain since 04/2015  . Anxiety   . Cholecystitis   . Arrhythmia   . History of nuclear stress test     Myoview 3/17: + chest pain; EF 33%, downsloping ST depression 2, V4-6, LVH with strain, no ischemia on images; intermediate risk     Past Surgical History  Procedure Laterality Date  . Osteochondroma excision Left 1972    "took bone tumor off my shoulder"  . Coronary angioplasty  12/05/01  . A flutter ablation  2007, 2008    catheter ablation   . Left heart catheterization with coronary angiogram N/A 12/19/2011    Procedure: LEFT HEART CATHETERIZATION WITH CORONARY ANGIOGRAM;  Surgeon: Larey Dresser, MD;  Location: [facility] CATH LAB;  Service: Cardiovascular;  Laterality: N/A;  . Cardiac catheterization N/A 05/03/2015    Procedure: Right/Left Heart Cath and Coronary Angiography;  Surgeon: Larey Dresser, MD;  Location: [place] CV LAB;  Service: Cardiovascular;  Laterality: N/A;  . Cardiac catheterization N/A 05/03/2015    Procedure: Coronary Stent Intervention;  Surgeon: Jettie Booze, MD;  Location: [place] CV LAB;  Service: Cardiovascular;  Laterality: N/A;  . Cardioversion N/A 08/24/2015    Procedure: CARDIOVERSION;  Surgeon: Thayer Headings, MD;  Location: [place];  Service: Cardiovascular;  Laterality: N/A;  . Cardiac catheterization N/A 11/04/2015    Procedure: Left Heart Cath and Coronary Angiography;  Surgeon: Larey Dresser, MD;  Location: [place] CV LAB;  Service: Cardiovascular;  Laterality: N/A;    Social History   Social History  . Marital Status: Widowed    Spouse Name: N/A  . Number of Children: 2  . Years of Education: N/A   Occupational History  .     Social History Main Topics  . Smoking status: Former Smoker -- 4.00 packs/day for 18 years    Types: Cigarettes    Quit date: 08/14/1983  . Smokeless tobacco: Never Used  . Alcohol Use: 9.6 oz/week    6 Cans  of beer, 10 Shots of liquor per week     Comment: 05/03/2015 "1-2 beers q 3 days; 4-5 shots of liquor twice/wk"  . Drug Use: Yes    Special: Marijuana     Comment: 05/03/2015 "twice/wk maybe"  . Sexual Activity: No   Other Topics Concern  . Not on file   Social History Narrative    Current Outpatient Prescriptions on File Prior to Visit  Medication Sig Dispense Refill  . acetaminophen (TYLENOL) 500 MG tablet Take 1,000 mg by mouth every 6 (six) hours as needed (pain). Reported on 08/18/2015    . amLODipine (NORVASC) 5 MG tablet Take 1 tablet (5 mg total) by mouth daily. 30 tablet 11  . atorvastatin (LIPITOR) 80 MG tablet Take 1 tablet (80 mg total) by mouth daily. 90 tablet 3  . carvedilol (COREG) 12.5 MG tablet Take 1 tablet (12.5 mg total) by mouth 2 (two) times daily with a meal. 60 tablet 11  . clopidogrel (PLAVIX) 75 MG tablet Take 1 tablet (75 mg total) by mouth daily with breakfast. 30 tablet 6  . furosemide (LASIX) 40 MG tablet Take 60 mg by mouth daily.  90 tablet 3  . hydrALAZINE (APRESOLINE) 50 MG tablet Take 1 tablet (50 mg total) by mouth 3 (three) times daily. 270 tablet 0  . nitroGLYCERIN (NITROSTAT) 0.4 MG SL tablet Place 0.4 mg under the tongue every 5 (five) minutes as needed for chest pain (x 3 tabs daily). Reported on 08/18/2015    . potassium chloride SA (K-DUR,KLOR-CON) 20 MEQ tablet Take 1 tablet (20 mEq total) by mouth daily.    [name] spironolactone (ALDACTONE) 25 MG tablet Take 1 tablet (25 mg total) by mouth daily. 90 tablet 3  . warfarin (COUMADIN) 5 MG tablet Take as directed by coumadin clinic (Patient taking differently: Take 2.5-5 mg by mouth daily. Take 2.5mg  by mouth on Tuesday and Sunday. Take 5 mg by mouth on all other days) 90 tablet 1   No current facility-administered medications on file prior to visit.    Allergies  Allergen Reactions  . Ace Inhibitors Cough  . Other Hives and Other (See Comments)    Patient reports developing hives after receiving "some  antibiotic given in 1980''s at [facility] He does not know which antibiotic.    Family History  Problem Relation Age of Onset  . Diabetes Mother   . Hypertension Mother   . Heart attack Neg Hx   . Stroke Neg Hx     BP 122/78 mmHg  Pulse 83  Temp(Src) 99.3 F (37.4 C) (Oral)  Ht 5' 8.5" (1.74 m)  Wt 220 lb (99.791 kg)  BMI 32.96 kg/m2  SpO2 97%  Review of Systems Denies LOC.     Objective:  Physical Exam VITAL SIGNS:  See vs page GENERAL: no distress Pulses: dorsalis pedis intact bilat.   MSK: no deformity of the feet CV: no leg edema Skin:  no ulcer on the feet.  normal color and temp on the feet. Neuro: sensation is intact to touch on the feet   A1c=8.0%    Assessment & Plan:  DM: he needs to have a lower insulin dosage, that he can take consistently.    Patient is advised the following: Patient Instructions  check your blood sugar twice a day.  vary the time of day when you check, between before the 3 meals, and at bedtime.  also check if you have symptoms of your blood sugar being too high or too low.  please keep a record of the readings and bring it to your next appointment here.  You can write it on any piece of paper.  please call us sooner if your blood sugar goes below 70, or if you have a lot of readings over 200.   Please reduce the lantus to 50 units each morning.  Please take this every morning, no matter what your sugar is On this type of insulin schedule, you should eat meals on a regular schedule.  If a meal is missed or significantly delayed, your blood sugar could go low.    Please continue the same metformin. Please come back for a follow-up appointment in 3 months.

## 2015-11-11 ENCOUNTER — Ambulatory Visit (INDEPENDENT_AMBULATORY_CARE_PROVIDER_SITE_OTHER): Payer: Medicare Other

## 2015-11-11 ENCOUNTER — Encounter (HOSPITAL_COMMUNITY)
Admission: RE | Admit: 2015-11-11 | Discharge: 2015-11-11 | Disposition: A | Discharge status: Home / Self Care | Payer: Medicare Other | Source: Ambulatory Visit | Attending: Cardiology | Admitting: Cardiology

## 2015-11-11 DIAGNOSIS — I509 Heart failure, unspecified: Secondary | ICD-10-CM | POA: Diagnosis not present

## 2015-11-11 DIAGNOSIS — Z955 Presence of coronary angioplasty implant and graft: Secondary | ICD-10-CM

## 2015-11-11 DIAGNOSIS — I4891 Unspecified atrial fibrillation: Secondary | ICD-10-CM | POA: Diagnosis not present

## 2015-11-11 DIAGNOSIS — Z5181 Encounter for therapeutic drug level monitoring: Secondary | ICD-10-CM | POA: Diagnosis not present

## 2015-11-11 DIAGNOSIS — Z7901 Long term (current) use of anticoagulants: Secondary | ICD-10-CM | POA: Diagnosis not present

## 2015-11-11 LAB — GLUCOSE, CAPILLARY: Glucose-Capillary: 171 mg/dL — ABNORMAL HIGH (ref 65–99)

## 2015-11-11 LAB — POCT INR: INR: 1.4

## 2015-11-11 NOTE — Telephone Encounter (Signed)
Pt had coumadin clinic appt today.

## 2015-11-11 NOTE — Progress Notes (Signed)
QUALITY OF LIFE SCORE REVIEW  Pt completed Quality of Life survey as a participant in Cardiac Rehab. Scores 21.0 or below are considered low. Pt scored the following       Quality of Life - 10/27/15 1113    Quality of Life Scores   Health/Function Pre 19.77 %   Socioeconomic Pre 20.42 %   Psych/Spiritual Pre 18.93 %   Family Pre 22.8 %   GLOBAL Pre 20.17 %     Pt is presently disabled and has been for three years. Three years ago pt also lost his wife of 24 years.  Pt wife had been sick for some time.  Pt also voiced displeasure with his appearnce mainly his teeth.  Pt does not have dental insurance or the money to get his teeth fixed.  Will do some research in the community for dental clinics and the services provided. .Patient quality of life slightly altered by physical constraints which limits ability to perform as prior to recent cardiac illness.  Pt  Stated that "I am very blessed to have the support sytem that i have and will try to do better in my own support.  Offered emotional support and reassurance.  Will continue to monitor and intervene as necessary.  Cherre Huger, BSN

## 2015-11-11 NOTE — Progress Notes (Signed)
Daily Session Note  Patient Details  Name: [name] [name] MRN: [id number] Date of Birth: 1955/11/13 Referring Provider:  Larey Dresser, MD  Encounter Date: 11/11/2015  Check In:     Session Check In - 11/11/15 1127    Check-In   Location [facility]   Staff Present Maurice Small, RN, Levie Heritage, MA, ACSM RCEP, Exercise Physiologist;Olinty [place], MS, ACSM CEP, Exercise Physiologist;Joann Rion, RN, BSN   Supervising physician immediately available to respond to emergencies [facility] immediately available   Physician(s) Dr. Sherral Hammers   Medication changes reported     No   Fall or balance concerns reported    No   Warm-up and Cool-down Performed as group-led instruction   Resistance Training Performed Yes   VAD Patient? No   Pain Assessment   Currently in Pain? No/denies   Multiple Pain Sites No      Capillary Blood Glucose: Results for orders placed or performed during the hospital encounter of 11/11/15 (from the past 24 hour(s))  Glucose, capillary     Status: Abnormal   Collection Time: 11/11/15 12:21 PM  Result Value Ref Range   Glucose-Capillary 171 (H) 65 - 99 mg/dL     Goals Met:  Personal goals reviewed   Goals Unmet:  Not Applicable  Comments: Pt started cardiac rehab today. Pt arrived early to exercise about an hour.  Asked pt about his INR check.  Pt indicated it was low. Pt remarked that his level was low. Put understood what he needed to do for his coumadin. Pt c/o of feeling tired.  Pt had not eaten any breakfast or lunch.  Pt had not taken his insulin but had not taken it today.  Pt had not checked his blood glucose at home but had taken his medication as directed. Pt bp checked 122/60, blood glucose 243.  Pt given some gatorade to drink, Pt denied the offered snack. Pt tolerated light exercise with some difficulty on the bike. Pt remarked that this felt "hard" to him. bp 180/100 which did return to wnl with slow laps around  the track. VSS with the elevation in bp on exertion, telemetry- SR with st depression first degree av block and occ PAC's, asymptomatic.  Medication list reconciled. Pt states he has improved his medicaiton compliance.  PSYCHOSOCIAL ASSESSMENT:  PHQ-0. Pt exhibits positive coping skills, hopeful outlook with supportive family which includes his children. No psychosocial needs identified at this time, no psychosocial interventions necessary.    Pt enjoys basketball, reading, watch tv and his six grandkids.   Pt oriented to exercise equipment and routine.    Understanding verbalized. Maurice Small RN, BSN    Dr. Fransico Him is Medical Director for Cardiac Rehab at [facility].

## 2015-11-12 DIAGNOSIS — E119 Type 2 diabetes mellitus without complications: Secondary | ICD-10-CM | POA: Insufficient documentation

## 2015-11-14 ENCOUNTER — Encounter (HOSPITAL_COMMUNITY)
Admission: RE | Admit: 2015-11-14 | Discharge: 2015-11-14 | Disposition: A | Discharge status: Home / Self Care | Payer: Medicare Other | Source: Ambulatory Visit | Attending: Cardiology | Admitting: Cardiology

## 2015-11-14 ENCOUNTER — Telehealth: Payer: Self-pay | Admitting: Endocrinology

## 2015-11-14 DIAGNOSIS — Z955 Presence of coronary angioplasty implant and graft: Secondary | ICD-10-CM

## 2015-11-14 DIAGNOSIS — I509 Heart failure, unspecified: Secondary | ICD-10-CM | POA: Insufficient documentation

## 2015-11-14 LAB — GLUCOSE, CAPILLARY
GLUCOSE-CAPILLARY: 243 mg/dL — AB (ref 65–99)
GLUCOSE-CAPILLARY: 112 mg/dL — AB (ref 65–99)

## 2015-11-14 NOTE — Telephone Encounter (Signed)
Pt said when he went to pick up his medication today that it was $90 and he cannot afford that.  He wants to know if there is something else he can take that is cheaper or an alternative more affordable way to get his insulin supply

## 2015-11-14 NOTE — Progress Notes (Signed)
Incomplete Session Note  Patient Details  Name: [name] [name] MRN: [id number] Date of Birth: [birth year] Referring Provider:  Aundra Dubin, [place]   [name] [name] did not complete his rehab session.  Pt reported to rehab staff that he had diarrhea all through the weekend and today.  Pt advised he would not be able to exercise today.  Pt instructed that he would need to be symptom free 48 hours  prior to returning to exercise. Cherre Huger, BSN

## 2015-11-14 NOTE — Telephone Encounter (Signed)
LMTCB

## 2015-11-15 NOTE — Telephone Encounter (Signed)
I contacted the pt and advised I has spoke with pharmacy. The pharmacy was dispensing the medication as a 60 day supply instead of a 30 day. The 30 day supply was going to cost 45 $. Pt stated he would be able to pick the medication up for 45. Pt was advised to call back if he has any issues picking his medication up. Pt voiced understanding.

## 2015-11-16 ENCOUNTER — Encounter (HOSPITAL_COMMUNITY): Payer: Medicare Other

## 2015-11-18 ENCOUNTER — Encounter (HOSPITAL_COMMUNITY)
Admission: RE | Admit: 2015-11-18 | Discharge: 2015-11-18 | Disposition: A | Discharge status: Home / Self Care | Payer: Medicare Other | Source: Ambulatory Visit | Attending: Cardiology | Admitting: Cardiology

## 2015-11-18 DIAGNOSIS — I509 Heart failure, unspecified: Secondary | ICD-10-CM | POA: Diagnosis not present

## 2015-11-18 DIAGNOSIS — Z955 Presence of coronary angioplasty implant and graft: Secondary | ICD-10-CM | POA: Diagnosis not present

## 2015-11-18 LAB — GLUCOSE, CAPILLARY
Glucose-Capillary: 199 mg/dL — ABNORMAL HIGH (ref 65–99)
Glucose-Capillary: 161 mg/dL — ABNORMAL HIGH (ref 65–99)

## 2015-11-18 NOTE — Progress Notes (Signed)
[name] [name] 60 y.o. male Nutrition Note Spoke with pt. Nutrition Plan and Nutrition Survey goals reviewed with pt. Pt is following Step 1 of the Therapeutic Lifestyle Changes diet. Pt continues to consume fried foods several times a week. Pt avoids red meat and shell fish due to gout. Pt wants to lose wt. Pt has not been actively trying to lose wt at this time. Wt loss tips reviewed. Pt is diabetic. Last A1c indicates blood glucose poorly controlled. This Probation officer went over Diabetes Education test results. Pt checks CBG's once daily "sometimes." Barrier to checking CBG's daily reportedly "not remembering to take it." Pt denies difficulty obtaining test strips. Ways to help pt remember to check CBG's discussed (e.g. Setting an alarm on his phone). Pt with dx of CHF. Per discussion, pt uses canned/convenience foods "sometimes." Pt pours the liquid off the vegetables but does not rinse canned vegetables. Pt on Coumadin. Pt is aware of the need to watch his consumption of green leafy vegetables. Pt expressed understanding of the information reviewed. Pt aware of nutrition education classes offered.  Lab Results  Component Value Date   HGBA1C 8.0 11/10/2015   Wt Readings from Last 3 Encounters:  11/10/15 220 lb (99.791 kg)  11/04/15 213 lb (96.616 kg)  11/01/15 217 lb (98.431 kg)   Nutrition Diagnosis ? Food-and nutrition-related knowledge deficit related to lack of exposure to information as related to diagnosis of: ? CVD ? DM ? Overweight related to excessive energy intake as evidenced by a BMI of 29.2  Nutrition RX/ Estimated Daily Nutrition Needs for: wt loss 1750-2250 Kcal, 45-60 gm fat, 11-15 gm sat fat, 1.7-2.2 gm trans-fat, <1500 mg sodium, 250 gm CHO   Nutrition Intervention ? Pt's individual nutrition plan reviewed with pt. ? Benefits of adopting Therapeutic Lifestyle Changes discussed when Medficts reviewed. ? Pt to attend the Portion Distortion class ? Pt to attend the Diabetes Q & A  class ? Pt given handouts for: ? Nutrition I class ? Nutrition II class ? Diabetes Blitz Class ? Continue client-centered nutrition education by RD, as part of interdisciplinary care. Goal(s) ? Pt to identify and limit food sources of saturated fat, trans fat, and sodium ? Pt to identify food quantities necessary to achieve weight loss of 6-24 lb (2.7-10.9 kg) at graduation from cardiac rehab.  ? CBG concentrations in the normal range or as close to normal as is safely possible. Monitor and Evaluate progress toward nutrition goal with team. Derek Mound, M.Ed, RD, LDN, CDE 11/18/2015 12:14 PM

## 2015-11-21 ENCOUNTER — Encounter (HOSPITAL_COMMUNITY)
Admission: RE | Admit: 2015-11-21 | Discharge: 2015-11-21 | Disposition: A | Discharge status: Home / Self Care | Payer: Medicare Other | Source: Ambulatory Visit | Attending: Cardiology | Admitting: Cardiology

## 2015-11-21 ENCOUNTER — Ambulatory Visit (INDEPENDENT_AMBULATORY_CARE_PROVIDER_SITE_OTHER): Payer: Medicare Other | Admitting: Pharmacist

## 2015-11-21 DIAGNOSIS — Z7901 Long term (current) use of anticoagulants: Secondary | ICD-10-CM | POA: Diagnosis not present

## 2015-11-21 DIAGNOSIS — Z5181 Encounter for therapeutic drug level monitoring: Secondary | ICD-10-CM

## 2015-11-21 DIAGNOSIS — I509 Heart failure, unspecified: Secondary | ICD-10-CM | POA: Diagnosis not present

## 2015-11-21 DIAGNOSIS — Z955 Presence of coronary angioplasty implant and graft: Secondary | ICD-10-CM

## 2015-11-21 DIAGNOSIS — I4891 Unspecified atrial fibrillation: Secondary | ICD-10-CM

## 2015-11-21 LAB — GLUCOSE, CAPILLARY
GLUCOSE-CAPILLARY: 148 mg/dL — AB (ref 65–99)
Glucose-Capillary: 155 mg/dL — ABNORMAL HIGH (ref 65–99)

## 2015-11-21 LAB — POCT INR: INR: 4.6

## 2015-11-23 ENCOUNTER — Encounter (HOSPITAL_COMMUNITY)
Admission: RE | Admit: 2015-11-23 | Discharge: 2015-11-23 | Disposition: A | Discharge status: Home / Self Care | Payer: Medicare Other | Source: Ambulatory Visit | Attending: Cardiology | Admitting: Cardiology

## 2015-11-23 DIAGNOSIS — I509 Heart failure, unspecified: Secondary | ICD-10-CM | POA: Diagnosis not present

## 2015-11-23 DIAGNOSIS — Z955 Presence of coronary angioplasty implant and graft: Secondary | ICD-10-CM

## 2015-11-23 LAB — GLUCOSE, CAPILLARY: Glucose-Capillary: 203 mg/dL — ABNORMAL HIGH (ref 65–99)

## 2015-11-25 ENCOUNTER — Encounter (HOSPITAL_COMMUNITY)
Admission: RE | Admit: 2015-11-25 | Discharge: 2015-11-25 | Disposition: A | Discharge status: Home / Self Care | Payer: Medicare Other | Source: Ambulatory Visit | Attending: Cardiology | Admitting: Cardiology

## 2015-11-25 DIAGNOSIS — Z955 Presence of coronary angioplasty implant and graft: Secondary | ICD-10-CM

## 2015-11-25 DIAGNOSIS — I509 Heart failure, unspecified: Secondary | ICD-10-CM | POA: Diagnosis not present

## 2015-11-25 LAB — GLUCOSE, CAPILLARY
Glucose-Capillary: 158 mg/dL — ABNORMAL HIGH (ref 65–99)
Glucose-Capillary: 108 mg/dL — ABNORMAL HIGH (ref 65–99)

## 2015-11-25 NOTE — Progress Notes (Signed)
Reviewed home exercise with pt today.  Pt plans to walk and go to [facility] for exercise.  Reviewed THR, pulse, RPE, sign and symptoms, NTG use, and when to call 911 or MD.  Also discussed weather considerations and indoor options.  Pt voiced understanding. Alberteen Sam, MA, ACSM RCEP  12:02 PM

## 2015-11-27 ENCOUNTER — Other Ambulatory Visit: Payer: Self-pay | Admitting: Cardiology

## 2015-11-28 ENCOUNTER — Encounter (HOSPITAL_COMMUNITY)
Admission: RE | Admit: 2015-11-28 | Discharge: 2015-11-28 | Disposition: A | Discharge status: Home / Self Care | Payer: Medicare Other | Source: Ambulatory Visit | Attending: Cardiology | Admitting: Cardiology

## 2015-11-28 DIAGNOSIS — Z955 Presence of coronary angioplasty implant and graft: Secondary | ICD-10-CM | POA: Diagnosis not present

## 2015-11-28 DIAGNOSIS — I509 Heart failure, unspecified: Secondary | ICD-10-CM | POA: Diagnosis not present

## 2015-11-28 LAB — GLUCOSE, CAPILLARY: Glucose-Capillary: 219 mg/dL — ABNORMAL HIGH (ref 65–99)

## 2015-11-30 ENCOUNTER — Encounter (HOSPITAL_COMMUNITY)
Admission: RE | Admit: 2015-11-30 | Discharge: 2015-11-30 | Disposition: A | Discharge status: Home / Self Care | Payer: Medicare Other | Source: Ambulatory Visit | Attending: Cardiology | Admitting: Cardiology

## 2015-11-30 DIAGNOSIS — I509 Heart failure, unspecified: Secondary | ICD-10-CM | POA: Diagnosis not present

## 2015-11-30 DIAGNOSIS — Z955 Presence of coronary angioplasty implant and graft: Secondary | ICD-10-CM | POA: Diagnosis not present

## 2015-11-30 LAB — GLUCOSE, CAPILLARY: GLUCOSE-CAPILLARY: 184 mg/dL — AB (ref 65–99)

## 2015-12-02 ENCOUNTER — Encounter (HOSPITAL_COMMUNITY)
Admission: RE | Admit: 2015-12-02 | Discharge: 2015-12-02 | Disposition: A | Discharge status: Home / Self Care | Payer: Medicare Other | Source: Ambulatory Visit | Attending: Cardiology | Admitting: Cardiology

## 2015-12-02 DIAGNOSIS — Z955 Presence of coronary angioplasty implant and graft: Secondary | ICD-10-CM

## 2015-12-02 DIAGNOSIS — I509 Heart failure, unspecified: Secondary | ICD-10-CM | POA: Diagnosis not present

## 2015-12-02 LAB — GLUCOSE, CAPILLARY: GLUCOSE-CAPILLARY: 170 mg/dL — AB (ref 65–99)

## 2015-12-05 ENCOUNTER — Encounter (HOSPITAL_COMMUNITY)
Admission: RE | Admit: 2015-12-05 | Discharge: 2015-12-05 | Disposition: A | Discharge status: Home / Self Care | Payer: Medicare Other | Source: Ambulatory Visit | Attending: Cardiology | Admitting: Cardiology

## 2015-12-05 ENCOUNTER — Ambulatory Visit (INDEPENDENT_AMBULATORY_CARE_PROVIDER_SITE_OTHER): Payer: Medicare Other | Admitting: *Deleted

## 2015-12-05 DIAGNOSIS — Z5181 Encounter for therapeutic drug level monitoring: Secondary | ICD-10-CM | POA: Diagnosis not present

## 2015-12-05 DIAGNOSIS — Z955 Presence of coronary angioplasty implant and graft: Secondary | ICD-10-CM | POA: Diagnosis not present

## 2015-12-05 DIAGNOSIS — I4891 Unspecified atrial fibrillation: Secondary | ICD-10-CM | POA: Diagnosis not present

## 2015-12-05 DIAGNOSIS — I509 Heart failure, unspecified: Secondary | ICD-10-CM | POA: Diagnosis not present

## 2015-12-05 DIAGNOSIS — Z7901 Long term (current) use of anticoagulants: Secondary | ICD-10-CM

## 2015-12-05 LAB — GLUCOSE, CAPILLARY: GLUCOSE-CAPILLARY: 121 mg/dL — AB (ref 65–99)

## 2015-12-05 LAB — POCT INR: INR: 3.4

## 2015-12-05 NOTE — Progress Notes (Signed)
Cardiac Individual Treatment Plan  Patient Details  Name: [name] [name] MRN: [id number] Date of Birth: [birth year] Referring Provider:        CARDIAC REHAB PHASE II EXERCISE from 11/23/2015 in [place]   Referring Provider  Loralie Champagne MD      Initial Encounter Date:       CARDIAC REHAB PHASE II EXERCISE from 11/23/2015 in [place]   Date  10/27/15   Referring Provider  Loralie Champagne MD      Visit Diagnosis: Stented coronary artery  Patient's Home Medications on Admission:  Current outpatient prescriptions:  .  acetaminophen (TYLENOL) 500 MG tablet, Take 1,000 mg by mouth every 6 (six) hours as needed (pain). Reported on 08/18/2015, Disp: , Rfl:  .  amLODipine (NORVASC) 5 MG tablet, Take 1 tablet (5 mg total) by mouth daily., Disp: 30 tablet, Rfl: 11 .  atorvastatin (LIPITOR) 80 MG tablet, Take 1 tablet (80 mg total) by mouth daily., Disp: 90 tablet, Rfl: 3 .  carvedilol (COREG) 12.5 MG tablet, Take 1 tablet (12.5 mg total) by mouth 2 (two) times daily with a meal., Disp: 60 tablet, Rfl: 11 .  clopidogrel (PLAVIX) 75 MG tablet, Take 1 tablet (75 mg total) by mouth daily with breakfast., Disp: 30 tablet, Rfl: 6 .  furosemide (LASIX) 40 MG tablet, Take 60 mg by mouth daily. , Disp: 90 tablet, Rfl: 3 .  furosemide (LASIX) 40 MG tablet, TAKE ONE TABLET BY MOUTH IN THE MORNING AND ONE-HALF TABLET IN THE EVENING, Disp: 45 tablet, Rfl: 10 .  hydrALAZINE (APRESOLINE) 50 MG tablet, Take 1 tablet (50 mg total) by mouth 3 (three) times daily., Disp: 270 tablet, Rfl: 0 .  Insulin Glargine (LANTUS SOLOSTAR) 100 UNIT/ML Solostar Pen, Inject 50 Units into the skin every morning. And pen needles 1/day, Disp: 30 mL, Rfl: 2 .  metFORMIN (GLUCOPHAGE) 1000 MG tablet, Take 1 tablet (1,000 mg total) by mouth 2 (two) times daily with a meal., Disp: 180 tablet, Rfl: 3 .  nitroGLYCERIN (NITROSTAT) 0.4 MG SL tablet, Place 0.4 mg under the  tongue every 5 (five) minutes as needed for chest pain (x 3 tabs daily). Reported on 08/18/2015, Disp: , Rfl:  .  potassium chloride SA (K-DUR,KLOR-CON) 20 MEQ tablet, Take 1 tablet (20 mEq total) by mouth daily., Disp: , Rfl:  .  spironolactone (ALDACTONE) 25 MG tablet, Take 1 tablet (25 mg total) by mouth daily., Disp: 90 tablet, Rfl: 3 .  warfarin (COUMADIN) 5 MG tablet, Take as directed by coumadin clinic (Patient taking differently: Take 2.5-5 mg by mouth daily. Take 2.5mg  by mouth on Tuesday and Sunday. Take 5 mg by mouth on all other days), Disp: 90 tablet, Rfl: 1  Past Medical History: Past Medical History  Diagnosis Date  . Hypertension   . Gout   . PAF (paroxysmal atrial fibrillation) (HCC)     On coumadin  . LIVER FUNCTION TESTS, ABNORMAL, HX OF 12/30/2006  . Rectal bleeding 12/18/2011    Scheduled for colonoscopy.    [name] HEPATITIS B, CHRONIC 12/30/2006  . HYPERCHOLESTEROLEMIA 07/11/2010  . MITRAL REGURGITATION 12/30/2006  . COLONIC POLYPS, HX OF 12/30/2006  . CAD (coronary artery disease), native coronary artery     a. Nonobstructive CAD by cath 2013 - diffuse distal and branch vessel CAD, no severe disease in the major coronaries, LV mild global hypokinesis, EF 45%. b. ETT-Sestamibi 5/14: EF 31%, small fixed inferior defect with no ischemia.  [name]  Chronic CHF ([place])     a. Mixed ICM/NICM (?EtOH). EF 35% in 2008. Echo 5/13: EF 60-65%, mod LVH, EF 45% on V gram in 12/2011. EF 12/2012: EF 50-55%, mild LVH, inferobasal HK, mild MR. ETT-Ses 5/14 EF 41%. Cardiac MRI 5/14: EF 44%, mild global HK, subepicardial delayed enhancement in nonspecific RV insertion pattern.  . H/O atrial flutter 2007    a. Ablations in 2007, 2008.  [name] History of medication noncompliance   . History of alcohol abuse   . 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 Heart murmur   . Anginal pain ([place])   . Sleep apnea     "suppose to send mask but they never did" (05/03/2015)  . Type II diabetes mellitus ([place])   .  Left sciatic nerve pain since 04/2015  . Anxiety   . Cholecystitis   . Arrhythmia   . History of nuclear stress test     Myoview 3/17: + chest pain; EF 33%, downsloping ST depression 2, V4-6, LVH with strain, no ischemia on images; intermediate risk     Tobacco Use: History  Smoking status  . Former Smoker -- 4.00 packs/day for 18 years  . Types: Cigarettes  . Quit date: 08/14/1983  Smokeless tobacco  . Never Used    Labs:     Recent Review Flowsheet Data    Labs for ITP Cardiac and Pulmonary Rehab Latest Ref Rng 05/03/2015 05/09/2015 07/12/2015 08/04/2015 11/10/2015   Cholestrol 125 - 200 mg/dL - - 104(L) - -   LDLCALC <130 mg/dL - - 44 - -   HDL >=40 mg/dL - - 44 - -   Trlycerides <150 mg/dL - - 78 - -   Hemoglobin A1c - - 8.5 - 6.8 8.0   HCO3 20.0 - 24.0 mEq/L 23.6 - - - -   TCO2 0 - 100 mmol/L 25 - - - -   ACIDBASEDEF 0.0 - 2.0 mmol/L 1.0 - - - -   O2SAT - 68.0 - - - -      Capillary Blood Glucose: Lab Results  Component Value Date   GLUCAP 184* 12/07/2015   GLUCAP 121* 12/05/2015   GLUCAP 170* 12/02/2015   GLUCAP 184* 11/30/2015   GLUCAP 219* 11/28/2015     Exercise Target Goals:    Exercise Program Goal: Individual exercise prescription set with THRR, safety & activity barriers. Participant demonstrates ability to understand and report RPE using BORG scale, to self-measure pulse accurately, and to acknowledge the importance of the exercise prescription.  Exercise Prescription Goal: Starting with aerobic activity 30 plus minutes a day, 3 days per week for initial exercise prescription. Provide home exercise prescription and guidelines that participant acknowledges understanding prior to discharge.  Activity Barriers & Risk Stratification:     Activity Barriers & Cardiac Risk Stratification - 10/27/15 0751    Activity Barriers & Cardiac Risk Stratification   Activity Barriers Deconditioning;Muscular Weakness   Cardiac Risk Stratification High      6  Minute Walk:     6 Minute Walk      10/27/15 0810       6 Minute Walk   Phase Initial     Distance 1838 feet     Walk Time 6 minutes     # of Rest Breaks 0     MPH 3.48     METS 4.76     RPE 13     VO2 Peak 16.67     Symptoms Yes (comment)  Comments chest pain 6/10, no medication yet this morning     Resting HR 83 bpm     Resting BP 124/60 mmHg     Max Ex. HR 116 bpm     Max Ex. BP 178/104 mmHg     2 Minute Post BP 148/100 mmHg  130/92     Pre/Post BP   Baseline BP 124/60 mmHg     6 Minute BP 178/104 mmHg     Pre/Post BP? Yes        Initial Exercise Prescription:     Initial Exercise Prescription - 11/24/15 1400    Date of Initial Exercise RX and Referring Provider   Date 10/27/15   Referring Provider Loralie Champagne MD   Bike   Level 1.2  was reduced to 0.6 due to HTN   Minutes 10   METs 3.31   NuStep   Level 3   Minutes 10   METs 1.6   Track   Laps 14   Minutes 10   METs 3.45   Resistance Training   Training Prescription Yes   Weight 3 lbs   Reps 10-12      Perform Capillary Blood Glucose checks as needed.  Exercise Prescription Changes:      Exercise Prescription Changes      11/15/15 0800 11/25/15 1200 11/29/15 0900       Exercise Review   Progression Yes  Yes     Response to Exercise   Blood Pressure (Admit) 140/90 mmHg  122/70 mmHg     Blood Pressure (Exercise) 180/100 mmHg  182/70 mmHg     Blood Pressure (Exit) 140/76 mmHg  108/60 mmHg     Heart Rate (Admit) 95 bpm  76 bpm     Heart Rate (Exercise) 126 bpm  109 bpm     Heart Rate (Exit) 93 bpm  83 bpm     Rating of Perceived Exertion (Exercise) 15  12     Symptoms exertional hypertension  exertional hypertension on bike     Comments  Home Exercise Given 11/25/15 Home Exercise Given 11/25/15     Duration Progress to 30 minutes of continuous aerobic without signs/symptoms of physical distress  Progress to 30 minutes of continuous aerobic without signs/symptoms of physical distress      Intensity THRR unchanged  THRR unchanged     Progression   Progression Continue to progress workloads to maintain intensity without signs/symptoms of physical distress.  Continue to progress workloads to maintain intensity without signs/symptoms of physical distress.     Average METs 2.8  2.98     Resistance Training   Training Prescription Yes Yes Yes     Weight 3 lbs 3 lbs 3 lbs     Reps 10-12 10-12 10-12     Interval Training   Interval Training No  No     Bike   Level 1.2  was reduced to 0.6 due to HTN 1.2  was reduced to 0.6 due to HTN 0.6     Minutes 10 10 10      METs 3.31 3.31 2.17     NuStep   Level 3 3 3      Minutes 10 10 10      METs 1.6 1.6 3.2     Track   Laps 14 14 16      Minutes 10 10 10      METs 3.45 3.45 3.76     Home Exercise Plan   Plans to continue exercise at  [employer] (comment)  [place] (comment)  [facility]     Frequency  Add 2 additional days to program exercise sessions. Add 2 additional days to program exercise sessions.        Exercise Comments:      Exercise Comments      11/29/15 0917           Exercise Comments Pt is tolerating exercise well and continues to make progress.  He has had some hypertensive responses on the bike.          Discharge Exercise Prescription (Final Exercise Prescription Changes):     Exercise Prescription Changes - 11/29/15 0900    Exercise Review   Progression Yes   Response to Exercise   Blood Pressure (Admit) 122/70 mmHg   Blood Pressure (Exercise) 182/70 mmHg   Blood Pressure (Exit) 108/60 mmHg   Heart Rate (Admit) 76 bpm   Heart Rate (Exercise) 109 bpm   Heart Rate (Exit) 83 bpm   Rating of Perceived Exertion (Exercise) 12   Symptoms exertional hypertension on bike   Comments Home Exercise Given 11/25/15   Duration Progress to 30 minutes of continuous aerobic without signs/symptoms of physical distress   Intensity THRR unchanged   Progression   Progression  Continue to progress workloads to maintain intensity without signs/symptoms of physical distress.   Average METs 2.98   Resistance Training   Training Prescription Yes   Weight 3 lbs   Reps 10-12   Interval Training   Interval Training No   Bike   Level 0.6   Minutes 10   METs 2.17   NuStep   Level 3   Minutes 10   METs 3.2   Track   Laps 16   Minutes 10   METs 3.76   Home Exercise Plan   Plans to continue exercise at [employer] (comment)  [facility]   Frequency Add 2 additional days to program exercise sessions.      Nutrition:  Target Goals: Understanding of nutrition guidelines, daily intake of sodium 1500mg , cholesterol 200mg , calories 30% from fat and 7% or less from saturated fats, daily to have 5 or more servings of fruits and vegetables.  Biometrics:     Pre Biometrics - 10/27/15 1026    Pre Biometrics   Height 5\' 11"  (1.803 m)   Weight 218 lb 7.6 oz (99.1 kg)   Waist Circumference 41 inches   Hip Circumference 39 inches   Waist to Hip Ratio 1.05 %   BMI (Calculated) 30.5   Triceps Skinfold 12 mm   % Body Fat 27.6 %   Grip Strength 46 kg   Flexibility 11.5 in   Single Leg Stand 30 seconds       Nutrition Therapy Plan and Nutrition Goals:     Nutrition Therapy & Goals - 10/27/15 0946    Nutrition Therapy   Diet Diabetic, Therapeutic Lifestyle Changes   Drug/Food Interactions Coumadin/Vit K   Personal Nutrition Goals   Personal Goal #1 6-12 lb wt loss to a goal wt of 205 lb at graduation from [place], educate and counsel regarding individualized specific dietary modifications aiming towards targeted core components such as weight, hypertension, lipid management, diabetes, heart failure and other comorbidities.   Expected Outcomes Short Term Goal: Understand basic principles of dietary content, such as calories, fat, sodium, cholesterol and nutrients.;Long Term Goal: Adherence to  prescribed nutrition plan.  Nutrition Discharge: Nutrition Scores:     Nutrition Assessments - 11/18/15 1337    MEDFICTS Scores   Pre Score 45      Nutrition Goals Re-Evaluation:   Psychosocial: Target Goals: Acknowledge presence or absence of depression, maximize coping skills, provide positive support system. Participant is able to verbalize types and ability to use techniques and skills needed for reducing stress and depression.  Initial Review & Psychosocial Screening:   Quality of Life Scores:     Quality of Life - 10/27/15 1113    Quality of Life Scores   Health/Function Pre 19.77 %   Socioeconomic Pre 20.42 %   Psych/Spiritual Pre 18.93 %   Family Pre 22.8 %   GLOBAL Pre 20.17 %      PHQ-9:     Recent Review Flowsheet Data    Depression screen [facility] 2/9 11/11/2015 08/18/2015 05/26/2015   Decreased Interest 0 0 0   Down, Depressed, Hopeless 0 0 0   PHQ - 2 Score 0 0 0      Psychosocial Evaluation and Intervention:   Psychosocial Re-Evaluation:   Vocational Rehabilitation: Provide vocational rehab assistance to qualifying candidates.   Vocational Rehab Evaluation & Intervention:     Vocational Rehab - 11/11/15 1156    Initial Vocational Rehab Evaluation & Intervention   Assessment shows need for Vocational Rehabilitation No  pt denies the need for vocational rehab.  Pt is disabled and has been for the last 3 years.      Education: Education Goals: Education classes will be provided on a weekly basis, covering required topics. Participant will state understanding/return demonstration of topics presented.  Learning Barriers/Preferences:     Learning Barriers/Preferences - 10/26/15 1534    Learning Barriers/Preferences   Learning Preferences Written Material;Audio;Verbal Instruction      Education Topics: Count Your Pulse:  -Group instruction provided by verbal instruction, demonstration, patient participation and written materials to  support subject.  Instructors address importance of being able to find your pulse and how to count your pulse when at home without a heart monitor.  Patients get hands on experience counting their pulse with staff help and individually.      CARDIAC REHAB PHASE II EXERCISE from 12/02/2015 in [place]   Date  11/18/15   Instruction Review Code  2- meets goals/outcomes      Heart Attack, Angina, and Risk Factor Modification:  -Group instruction provided by verbal instruction, video, and written materials to support subject.  Instructors address signs and symptoms of angina and heart attacks.    Also discuss risk factors for heart disease and how to make changes to improve heart health risk factors.   Functional Fitness:  -Group instruction provided by verbal instruction, demonstration, patient participation, and written materials to support subject.  Instructors address safety measures for doing things around the house.  Discuss how to get up and down off the floor, how to pick things up properly, how to safely get out of a chair without assistance, and balance training.      CARDIAC REHAB PHASE II EXERCISE from 12/02/2015 in [place]   Date  12/02/15   Instruction Review Code  2- meets goals/outcomes      Meditation and Mindfulness:  -Group instruction provided by verbal instruction, patient participation, and written materials to support subject.  Instructor addresses importance of mindfulness and meditation practice to help reduce stress and improve awareness.  Instructor also leads participants through  a meditation exercise.    Stretching for Flexibility and Mobility:  -Group instruction provided by verbal instruction, patient participation, and written materials to support subject.  Instructors lead participants through series of stretches that are designed to increase flexibility thus improving mobility.  These stretches  are additional exercise for major muscle groups that are typically performed during regular warm up and cool down.      CARDIAC REHAB PHASE II EXERCISE from 12/02/2015 in [place]   Date  11/25/15   Educator  Alberteen Sam, MA ,ACSM RCEP   Instruction Review Code  2- meets goals/outcomes      Hands Only CPR Anytime:  -Group instruction provided by verbal instruction, video, patient participation and written materials to support subject.  Instructors co-teach with AHA video for hands only CPR.  Participants get hands on experience with mannequins.   Nutrition I class: Heart Healthy Eating:  -Group instruction provided by PowerPoint slides, verbal discussion, and written materials to support subject matter. The instructor gives an explanation and review of the Therapeutic Lifestyle Changes diet recommendations, which includes a discussion on lipid goals, dietary fat, sodium, fiber, plant stanol/sterol esters, sugar, and the components of a well-balanced, healthy diet.      CARDIAC REHAB PHASE II EXERCISE from 12/02/2015 in [place]   Date  11/18/15   Educator  RD   Instruction Review Code  Not applicable      Nutrition II class: Lifestyle Skills:  -Group instruction provided by PowerPoint slides, verbal discussion, and written materials to support subject matter. The instructor gives an explanation and review of label reading, grocery shopping for heart health, heart healthy recipe modifications, and ways to make healthier choices when eating out.      CARDIAC REHAB PHASE II EXERCISE from 12/02/2015 in [place]   Date  11/18/15   Educator  RD   Instruction Review Code  Not applicable      Diabetes Question & Answer:  -Group instruction provided by PowerPoint slides, verbal discussion, and written materials to support subject matter. The instructor gives an explanation and review of  diabetes co-morbidities, pre- and post-prandial blood glucose goals, pre-exercise blood glucose goals, signs, symptoms, and treatment of hypoglycemia and hyperglycemia, and foot care basics.   Diabetes Blitz:  -Group instruction provided by PowerPoint slides, verbal discussion, and written materials to support subject matter. The instructor gives an explanation and review of the physiology behind type 1 and type 2 diabetes, diabetes medications and rational behind using different medications, pre- and post-prandial blood glucose recommendations and Hemoglobin A1c goals, diabetes diet, and exercise including blood glucose guidelines for exercising safely.       CARDIAC REHAB PHASE II EXERCISE from 12/02/2015 in [place]   Date  11/18/15   Educator  RD   Instruction Review Code  Not applicable      Portion Distortion:  -Group instruction provided by PowerPoint slides, verbal discussion, written materials, and food models to support subject matter. The instructor gives an explanation of serving size versus portion size, changes in portions sizes over the last 20 years, and what consists of a serving from each food group.   Stress Management:  -Group instruction provided by verbal instruction, video, and written materials to support subject matter.  Instructors review role of stress in heart disease and how to cope with stress positively.        CARDIAC REHAB  PHASE II EXERCISE from 12/02/2015 in [place]   Date  11/30/15   Instruction Review Code  2- meets goals/outcomes      Exercising on Your Own:  -Group instruction provided by verbal instruction, power point, and written materials to support subject.  Instructors discuss benefits of exercise, components of exercise, frequency and intensity of exercise, and end points for exercise.  Also discuss use of nitroglycerin and activating EMS.  Review options of places to exercise  outside of rehab.  Review guidelines for sex with heart disease.   Cardiac Drugs I:  -Group instruction provided by verbal instruction and written materials to support subject.  Instructor reviews cardiac drug classes: antiplatelets, anticoagulants, beta blockers, and statins.  Instructor discusses reasons, side effects, and lifestyle considerations for each drug class.          CARDIAC REHAB PHASE II EXERCISE from 12/02/2015 in [place]   Date  11/23/15   Educator  Pharm D   Instruction Review Code  2- meets goals/outcomes      Cardiac Drugs II:  -Group instruction provided by verbal instruction and written materials to support subject.  Instructor reviews cardiac drug classes: angiotensin converting enzyme inhibitors (ACE-I), angiotensin II receptor blockers (ARBs), nitrates, and calcium channel blockers.  Instructor discusses reasons, side effects, and lifestyle considerations for each drug class.   Anatomy and Physiology of the Circulatory System:  -Group instruction provided by verbal instruction, video, and written materials to support subject.  Reviews functional anatomy of heart, how it relates to various diagnoses, and what role the heart plays in the overall system.   Knowledge Questionnaire Score:     Knowledge Questionnaire Score - 11/18/15 1212    Knowledge Questionnaire Score   Pre Score 20/24               DM score 14/15      Core Components/Risk Factors/Patient Goals at Admission:     Personal Goals and Risk Factors at Admission - 10/27/15 0752    Core Components/Risk Factors/Patient Goals on Admission    Weight Management Yes   Intervention Weight Management: Develop a combined nutrition and exercise program designed to reach desired caloric intake, while maintaining appropriate intake of nutrient and fiber, sodium and fats, and appropriate energy expenditure required for the weight goal.;Weight Management: Provide education and  appropriate resources to help participant work on and attain dietary goals.   Admit Weight 218 lb 7.6 oz (99.1 kg)   Goal Weight: Short Term 209 lb 7 oz (95 kg)   Goal Weight: Long Term 200 lb (90.719 kg)   Expected Outcomes Long Term: Adherence to nutrition and physical activity/exercise program aimed toward attainment of established weight goal.;Short Term: Continue to assess and modify interventions until short term weight is achieved.   Sedentary Yes  Increase stamina and cardio capacity   Intervention Provide advice, education, support and counseling about physical activity/exercise needs.;Develop an individualized exercise prescription for aerobic and resistive training based on initial evaluation findings, risk stratification, comorbidities and participant's personal goals.   Expected Outcomes Achievement of increased cardiorespiratory fitness and enhanced flexibility, muscular endurance and strength shown through measurements of functional capaciy and personal statement of participant.   Diabetes Yes   Intervention Provide education about signs/symptoms and action to take for hypo/hyperglycemia.;Provide education about proper nutrition, including hydration, and aerobic/resistive exercise prescription along with prescribed medications to achieve blood glucose in normal ranges: Fasting glucose 65-99 mg/dL  Expected Outcomes Short Term: Participant verbalizes understanding of the signs/symptoms and immediate care of hyper/hypoglycemia, proper foot care and importance of medication, aerobic/resistive exercise and nutrition plan for blood glucose control.;Long Term: Attainment of HbA1C < 7%.   Hypertension Yes   Intervention Provide education on lifestyle modifcations including regular physical activity/exercise, weight management, moderate sodium restriction and increased consumption of fresh fruit, vegetables, and low fat dairy, alcohol moderation, and smoking cessation.;Monitor prescription use  compliance.   Expected Outcomes Short Term: Continued assessment and intervention until BP is < 140/3mm HG in hypertensive participants. < 130/15mm HG in hypertensive participants with diabetes, heart failure or chronic kidney disease.;Long Term: Maintenance of blood pressure at goal levels.   Lipids Yes   Intervention Provide education and support for participant on nutrition & aerobic/resistive exercise along with prescribed medications to achieve LDL 70mg , HDL >40mg .   Expected Outcomes Short Term: Participant states understanding of desired cholesterol values and is compliant with medications prescribed. Participant is following exercise prescription and nutrition guidelines.;Long Term: Cholesterol controlled with medications as prescribed, with individualized exercise RX and with personalized nutrition plan. Value goals: LDL < 70mg , HDL > 40 mg.      Core Components/Risk Factors/Patient Goals Review:      Goals and Risk Factor Review      11/30/15 1202           Core Components/Risk Factors/Patient Goals Review   Personal Goals Review Weight Management/Obesity;Sedentary;Diabetes;Hypertension       Review He has lost some weight (down to 97.5 kg). He is working out in yard, but has yet to starting a walking program (plans to start soon).  His blood sugars and blood pressures still tend to fluctuate and will continue to be monitored       Expected Outcomes Conintued weight loss and stabilize blood surgas and blood pressures.          Core Components/Risk Factors/Patient Goals at Discharge (Final Review):      Goals and Risk Factor Review - 11/30/15 1202    Core Components/Risk Factors/Patient Goals Review   Personal Goals Review Weight Management/Obesity;Sedentary;Diabetes;Hypertension   Review He has lost some weight (down to 97.5 kg). He is working out in yard, but has yet to starting a walking program (plans to start soon).  His blood sugars and blood pressures still tend to  fluctuate and will continue to be monitored   Expected Outcomes Conintued weight loss and stabilize blood surgas and blood pressures.      ITP Comments:     ITP Comments      10/27/15 0751           ITP Comments Medical Director-Dr. Fransico Him, MD          Comments:  Pt is making expected progress toward personal goals after completing 11 sessions. Great improvement of pt compliance to medication and monitoring his blood glucose readings!  Recommend continued exercise and life style modification education including  stress management and relaxation techniques to decrease cardiac risk profile. Cherre Huger, BSN

## 2015-12-07 ENCOUNTER — Encounter (HOSPITAL_COMMUNITY)
Admission: RE | Admit: 2015-12-07 | Discharge: 2015-12-07 | Disposition: A | Discharge status: Home / Self Care | Payer: Medicare Other | Source: Ambulatory Visit | Attending: Cardiology | Admitting: Cardiology

## 2015-12-07 DIAGNOSIS — Z955 Presence of coronary angioplasty implant and graft: Secondary | ICD-10-CM

## 2015-12-07 DIAGNOSIS — I509 Heart failure, unspecified: Secondary | ICD-10-CM | POA: Diagnosis not present

## 2015-12-07 LAB — GLUCOSE, CAPILLARY: GLUCOSE-CAPILLARY: 184 mg/dL — AB (ref 65–99)

## 2015-12-09 ENCOUNTER — Encounter (HOSPITAL_COMMUNITY)
Admission: RE | Admit: 2015-12-09 | Discharge: 2015-12-09 | Disposition: A | Discharge status: Home / Self Care | Payer: Medicare Other | Source: Ambulatory Visit | Attending: Cardiology | Admitting: Cardiology

## 2015-12-09 DIAGNOSIS — Z955 Presence of coronary angioplasty implant and graft: Secondary | ICD-10-CM | POA: Diagnosis not present

## 2015-12-09 DIAGNOSIS — I509 Heart failure, unspecified: Secondary | ICD-10-CM | POA: Diagnosis not present

## 2015-12-09 LAB — GLUCOSE, CAPILLARY: Glucose-Capillary: 170 mg/dL — ABNORMAL HIGH (ref 65–99)

## 2015-12-12 ENCOUNTER — Encounter (HOSPITAL_COMMUNITY)
Admission: RE | Admit: 2015-12-12 | Discharge: 2015-12-12 | Disposition: A | Discharge status: Home / Self Care | Payer: Medicare Other | Source: Ambulatory Visit | Attending: Cardiology | Admitting: Cardiology

## 2015-12-12 DIAGNOSIS — Z955 Presence of coronary angioplasty implant and graft: Secondary | ICD-10-CM | POA: Insufficient documentation

## 2015-12-12 DIAGNOSIS — I509 Heart failure, unspecified: Secondary | ICD-10-CM | POA: Diagnosis not present

## 2015-12-13 LAB — GLUCOSE, CAPILLARY: Glucose-Capillary: 164 mg/dL — ABNORMAL HIGH (ref 65–99)

## 2015-12-14 ENCOUNTER — Encounter (HOSPITAL_COMMUNITY)
Admission: RE | Admit: 2015-12-14 | Discharge: 2015-12-14 | Disposition: A | Discharge status: Home / Self Care | Payer: Medicare Other | Source: Ambulatory Visit | Attending: Cardiology | Admitting: Cardiology

## 2015-12-14 DIAGNOSIS — Z955 Presence of coronary angioplasty implant and graft: Secondary | ICD-10-CM

## 2015-12-14 DIAGNOSIS — I509 Heart failure, unspecified: Secondary | ICD-10-CM | POA: Diagnosis not present

## 2015-12-15 LAB — GLUCOSE, CAPILLARY: Glucose-Capillary: 201 mg/dL — ABNORMAL HIGH (ref 65–99)

## 2015-12-16 ENCOUNTER — Ambulatory Visit (INDEPENDENT_AMBULATORY_CARE_PROVIDER_SITE_OTHER): Payer: Medicare Other

## 2015-12-16 ENCOUNTER — Encounter (HOSPITAL_COMMUNITY)
Admission: RE | Admit: 2015-12-16 | Discharge: 2015-12-16 | Disposition: A | Discharge status: Home / Self Care | Payer: Medicare Other | Source: Ambulatory Visit | Attending: Cardiology | Admitting: Cardiology

## 2015-12-16 DIAGNOSIS — I4891 Unspecified atrial fibrillation: Secondary | ICD-10-CM | POA: Diagnosis not present

## 2015-12-16 DIAGNOSIS — I509 Heart failure, unspecified: Secondary | ICD-10-CM | POA: Diagnosis not present

## 2015-12-16 DIAGNOSIS — Z5181 Encounter for therapeutic drug level monitoring: Secondary | ICD-10-CM

## 2015-12-16 DIAGNOSIS — Z7901 Long term (current) use of anticoagulants: Secondary | ICD-10-CM | POA: Diagnosis not present

## 2015-12-16 DIAGNOSIS — Z955 Presence of coronary angioplasty implant and graft: Secondary | ICD-10-CM

## 2015-12-16 LAB — POCT INR: INR: 1.7

## 2015-12-16 LAB — GLUCOSE, CAPILLARY: GLUCOSE-CAPILLARY: 277 mg/dL — AB (ref 65–99)

## 2015-12-19 ENCOUNTER — Encounter (HOSPITAL_COMMUNITY)
Admission: RE | Admit: 2015-12-19 | Discharge: 2015-12-19 | Disposition: A | Discharge status: Home / Self Care | Payer: Medicare Other | Source: Ambulatory Visit | Attending: Cardiology | Admitting: Cardiology

## 2015-12-19 DIAGNOSIS — Z955 Presence of coronary angioplasty implant and graft: Secondary | ICD-10-CM

## 2015-12-19 DIAGNOSIS — I509 Heart failure, unspecified: Secondary | ICD-10-CM | POA: Diagnosis not present

## 2015-12-19 LAB — GLUCOSE, CAPILLARY: GLUCOSE-CAPILLARY: 181 mg/dL — AB (ref 65–99)

## 2015-12-21 ENCOUNTER — Encounter (HOSPITAL_COMMUNITY)
Admission: RE | Admit: 2015-12-21 | Discharge: 2015-12-21 | Disposition: A | Discharge status: Home / Self Care | Payer: Medicare Other | Source: Ambulatory Visit | Attending: Cardiology | Admitting: Cardiology

## 2015-12-21 DIAGNOSIS — I509 Heart failure, unspecified: Secondary | ICD-10-CM | POA: Diagnosis not present

## 2015-12-21 DIAGNOSIS — Z955 Presence of coronary angioplasty implant and graft: Secondary | ICD-10-CM

## 2015-12-21 LAB — GLUCOSE, CAPILLARY: GLUCOSE-CAPILLARY: 204 mg/dL — AB (ref 65–99)

## 2015-12-23 ENCOUNTER — Encounter (HOSPITAL_COMMUNITY)
Admission: RE | Admit: 2015-12-23 | Discharge: 2015-12-23 | Disposition: A | Discharge status: Home / Self Care | Payer: Medicare Other | Source: Ambulatory Visit | Attending: Cardiology | Admitting: Cardiology

## 2015-12-23 DIAGNOSIS — I509 Heart failure, unspecified: Secondary | ICD-10-CM | POA: Diagnosis not present

## 2015-12-23 DIAGNOSIS — Z955 Presence of coronary angioplasty implant and graft: Secondary | ICD-10-CM

## 2015-12-23 LAB — GLUCOSE, CAPILLARY: Glucose-Capillary: 240 mg/dL — ABNORMAL HIGH (ref 65–99)

## 2015-12-26 ENCOUNTER — Encounter (HOSPITAL_COMMUNITY)
Admission: RE | Admit: 2015-12-26 | Discharge: 2015-12-26 | Disposition: A | Discharge status: Home / Self Care | Payer: Medicare Other | Source: Ambulatory Visit | Attending: Cardiology | Admitting: Cardiology

## 2015-12-26 DIAGNOSIS — I509 Heart failure, unspecified: Secondary | ICD-10-CM | POA: Diagnosis not present

## 2015-12-26 DIAGNOSIS — Z955 Presence of coronary angioplasty implant and graft: Secondary | ICD-10-CM | POA: Diagnosis not present

## 2015-12-26 LAB — GLUCOSE, CAPILLARY: Glucose-Capillary: 245 mg/dL — ABNORMAL HIGH (ref 65–99)

## 2015-12-28 ENCOUNTER — Encounter (HOSPITAL_COMMUNITY): Payer: Medicare Other

## 2015-12-28 ENCOUNTER — Ambulatory Visit: Payer: Self-pay | Admitting: General Surgery

## 2015-12-28 DIAGNOSIS — I480 Paroxysmal atrial fibrillation: Secondary | ICD-10-CM | POA: Diagnosis not present

## 2015-12-28 DIAGNOSIS — I5032 Chronic diastolic (congestive) heart failure: Secondary | ICD-10-CM | POA: Diagnosis not present

## 2015-12-28 DIAGNOSIS — Z7901 Long term (current) use of anticoagulants: Secondary | ICD-10-CM | POA: Diagnosis not present

## 2015-12-28 DIAGNOSIS — K8010 Calculus of gallbladder with chronic cholecystitis without obstruction: Secondary | ICD-10-CM | POA: Diagnosis not present

## 2015-12-28 DIAGNOSIS — Z434 Encounter for attention to other artificial openings of digestive tract: Secondary | ICD-10-CM | POA: Diagnosis not present

## 2015-12-28 DIAGNOSIS — I10 Essential (primary) hypertension: Secondary | ICD-10-CM | POA: Diagnosis not present

## 2015-12-28 NOTE — H&P (Signed)
[name] [name] 12/28/2015 11:23 AM Location: [facility] Patient #: [id number] DOB: [birth year] Widowed / Language: [name] [name] / Race: Black or African American Male   History of Present Illness [name] [name] M. [name] [name] [name] [name]; 12/28/2015 11:51 AM) The patient is a 60 year old male who presents for evaluation of acute cholecystitis. He comes in for long-term follow-up regarding his cholecystostomy tube. This was placed in October for acute cholecystitis. He had recently undergone placement of a drug-eluting stent and was deemed not an operative candidate at that time. He underwent a cholecystostomy tube study on December 6 which showed partial opacification of the gallbladder with multiple filling defects, the cystic duct and common bile duct was patent with contrast emptying into the duodenum. They decided to leave the cholecystostomy tube to drainage. The patient is a very eager to have surgery. However he was admitted recently from January 9 through the 11th for a flutter. He was cardioverted. He underwent a cardiology catheterization in March. There is no new lesions that were amenable to intervention. He has been undergoing cardiac rehabilitation several times a week. He states he is doing well. He denies any fever, chills, abdominal pain. He reports good appetite. He reports some occasional soreness around the actual drain. He denies any chest pain or chest pressure. He denies any shortness of breath at rest. He denies any orthopnea, paroxysmal nocturnal dyspnea or edema.  06/30/2015 He comes in for follow-up after being seen in the hospital for acute calculus cholecystitis he was in the hospital from October 22 - October 27. He presented with right upper quadrant pain. He was found to have gallstones on ultrasound but no evidence of cholecystitis. He had ongoing discomfort. His lab work was unremarkable except for an elevated INR. He was on Plavix and Coumadin for heart disease as  well as paroxysmal atrial fibrillation. He underwent a nuclear medicine scan the following morning after admission and was found to have acute cholecystitis. Unfortunately he had just had a drug-eluting stent placed in one of his coronary arteries on September 20. Because of the need not to stop any of his anticoagulation because of concerns for restent stenosis, he underwent placement of a percutaneous cholecystostomy tube on October 25. He denies any abdominal pain. He denies any fever or chills. He denies any nausea or vomiting. He reports a good appetite. He is having about 40 cc of bile out through his drain daily.  He does endorse some occasional chest pain. He denies any edema, paroxysmal internal dyspnea, orthopnea, source of breath at rest. Review of systems-a 12 point review of systems was performed and all systems are negative except for what is mentioned in the HPI   Problem List/Past Medical [name] [name], [name] [name]; 12/28/2015 11:52 AM) CHRONIC ANTICOAGULATION (Z79.01) PAROXYSMAL ATRIAL FIBRILLATION (123XX123) CHRONIC DIASTOLIC HEART FAILURE (99991111) HYPERTENSION, ESSENTIAL (I10) ADMISSION FOR BILIARY DRAINAGE TUBE PLACEMENT (Z46.82) VISIT FOR WOUND CHECK (Z51.89) DIABETES MELLITUS TYPE 2, UNCONTROLLED (E11.65) CHOLECYSTOSTOMY CARE (Z43.4) CHRONIC CHOLECYSTITIS WITH CALCULUS (K80.10)  Other Problems [name] [name], [name] [name]; 12/28/2015 11:52 AM) Sleep Apnea  Past Surgical History [name] [name], [name] [name]; 12/28/2015 11:52 AM) Shoulder Surgery Left.  Diagnostic Studies History [name] [name], [name] [name]; 12/28/2015 11:52 AM) Colonoscopy 1-5 years ago  Allergies [name] Numbers, LPN; 075-GRM [id number] AM) ACE Inhibitors  Medication History [name] Numbers, LPN; 075-GRM 075-GRM AM) AmLODIPine Besylate (10MG  Tablet, Oral) Active. Atorvastatin Calcium (80MG  Tablet, Oral) Active. Carvedilol (25MG  Tablet, Oral) Active. Cefdinir (300MG  Capsule, Oral) Active. Clopidogrel  Bisulfate (  75MG  Tablet, Oral) Active. Furosemide (40MG  Tablet, Oral) Active. HydrALAZINE HCl (50MG  Tablet, Oral) Active. Isosorbide Mononitrate ER (60MG  Tablet ER 24HR, Oral) Active. Lantus (100UNIT/ML Solution, Subcutaneous) Active. MetFORMIN HCl (1000MG  Tablet, Oral) Active. MetroNIDAZOLE (500MG  Tablet, Oral) Active. Nitrostat (0.4MG  Tab Sublingual, Sublingual) Active. Spironolactone (25MG  Tablet, Oral) Active. Warfarin Sodium (5MG  Tablet, Oral) Active. Enoxaparin Sodium (150MG /ML Solution, Subcutaneous) Active. Potassium Chloride (20MEQ Packet, Oral) Active. Medications Reconciled  Social History [name] [name], [name] [name]; 12/28/2015 11:52 AM) Alcohol use Occasional alcohol use. Tobacco use Former smoker.  Vitals [name] Millin F. Turpin LPN; 075-GRM [id number] AM) 12/28/2015 11:23 AM Weight: 220.2 lb Height: 72in Height was reported by patient. Body Surface Area: 2.22 m Body Mass Index: 29.86 kg/m  Temp.: 98.48F(Oral)  Pulse: 72 (Regular)  Resp.: 18 (Unlabored)  P.OX: 99% (Room air) BP: 150/92 (Sitting, Left Arm, Standard)       Physical Exam [name] [name] M. [name] [name] [name] [name]; 12/28/2015 11:49 AM) General Mental Status-Alert. General Appearance-Consistent with stated age. Hydration-Well hydrated. Voice-Normal.  Head and Neck Head-normocephalic, atraumatic with no lesions or palpable masses. Trachea-midline. Thyroid Gland Characteristics - normal size and consistency.  Eye Eyeball - Bilateral-Extraocular movements intact. Sclera/Conjunctiva - Bilateral-No scleral icterus.  Chest and Lung Exam Chest and lung exam reveals -quiet, even and easy respiratory effort with no use of accessory muscles and on auscultation, normal breath sounds, no adventitious sounds and normal vocal resonance. Inspection Chest Wall - Normal. Back - normal.  Breast - Did not examine.  Cardiovascular Cardiovascular examination reveals -normal heart sounds, regular  rate and rhythm with no murmurs.  Abdomen Inspection Inspection of the abdomen reveals - No Hernias. Note: RUQ gallbladder drain - skin intact; no cellulitis. some bile in drain bag. Skin - Scar - no surgical scars. Palpation/Percussion Palpation and Percussion of the abdomen reveal - Soft, Non Tender, No Rebound tenderness, No Rigidity (guarding) and No hepatosplenomegaly. Auscultation Auscultation of the abdomen reveals - Bowel sounds normal.  Peripheral Vascular Upper Extremity Palpation - Pulses bilaterally normal.  Neurologic Neurologic evaluation reveals -alert and oriented x 3 with no impairment of recent or remote memory. Mental Status-Normal.  Neuropsychiatric The patient's mood and affect are described as -normal. Judgment and Insight-insight is appropriate concerning matters relevant to self.  Musculoskeletal Normal Exam - Left-Upper Extremity Strength Normal and Lower Extremity Strength Normal. Normal Exam - Right-Upper Extremity Strength Normal and Lower Extremity Strength Normal.  Lymphatic Head & Neck  General Head & Neck Lymphatics: Bilateral - Description - Normal. Axillary - Did not examine. Femoral & Inguinal - Did not examine.    Assessment & Plan [name] [name] M. Dejanay Wamboldt [name] [name]; 12/28/2015 11:49 AM) CHRONIC CHOLECYSTITIS WITH CALCULUS (K80.10) Impression: I discussed laparoscopic cholecystectomy with IOC in detail. The patient was given educational material as well as diagrams detailing the procedure. We discussed the risks and benefits of a laparoscopic cholecystectomy including, but not limited to bleeding, infection, injury to surrounding structures such as the intestine or liver, bile leak, retained gallstones, need to convert to an open procedure, prolonged diarrhea, blood clots such as DVT, common bile duct injury, anesthesia risks, and possible need for additional procedures. We discussed the typical post-operative recovery course. I explained that the  likelihood of improvement of their symptoms is good.  The patient has elected to proceed with surgery. However as described above we need to obtain cardiac clearance as well as permission to hold his blood thinners. I did discuss that he was at higher risk for bleeding, hematoma formation, infected hematoma, perioperative cardiac and pulmonary  events and potentially prolonged hospitalization Current Plans Pt Education - Pamphlet Given - Laparoscopic Gallbladder Surgery: discussed with patient and provided information. I recommended obtaining preoperative cardiac clearance. I am concerned about the health of the patient and the ability to tolerate the operation. Therefore, we will request clearance by cardiology to better assess operative risk & see if a reevaluation, further workup, etc is needed. Also recommendations on how medications such as for anticoagulation and blood pressure should be managed/held/restarted after surgery. HYPERTENSION, ESSENTIAL (I10) CHRONIC ANTICOAGULATION (Z79.01) PAROXYSMAL ATRIAL FIBRILLATION (123XX123) CHRONIC DIASTOLIC HEART FAILURE (99991111) CHOLECYSTOSTOMY CARE (Z43.4) Impression: He appears to be doing well. Clinically no evidence of cholecystitis. We rediscussed long-term management of his cholecystostomy tube. I believe that he is at very high risk for recurrent cholecystitis should we remove his cholecystostomy tube therefore I believe he needs a cholecystectomy. In reading his last cardiologist's note that I could find and recommended waiting until the end of March before considering surgery. We will send a clearance letter to his cardiologist with permission to hold his 2 blood thinners as well as permission to operate.  Leighton Ruff. Redmond Pulling, [name] [name], FACS General, Bariatric, & Minimally Invasive Surgery [facility], [place]

## 2015-12-29 ENCOUNTER — Ambulatory Visit: Payer: Medicare Other | Attending: Internal Medicine | Admitting: Internal Medicine

## 2015-12-29 ENCOUNTER — Other Ambulatory Visit: Payer: Self-pay | Admitting: General Surgery

## 2015-12-29 ENCOUNTER — Encounter: Payer: Self-pay | Admitting: Internal Medicine

## 2015-12-29 VITALS — BP 130/68 | HR 93 | Temp 98.6°F | Resp 18 | Ht 68.5 in | Wt 218.0 lb

## 2015-12-29 DIAGNOSIS — I25119 Atherosclerotic heart disease of native coronary artery with unspecified angina pectoris: Secondary | ICD-10-CM

## 2015-12-29 DIAGNOSIS — I480 Paroxysmal atrial fibrillation: Secondary | ICD-10-CM

## 2015-12-29 DIAGNOSIS — E1151 Type 2 diabetes mellitus with diabetic peripheral angiopathy without gangrene: Secondary | ICD-10-CM

## 2015-12-29 DIAGNOSIS — I25110 Atherosclerotic heart disease of native coronary artery with unstable angina pectoris: Secondary | ICD-10-CM | POA: Insufficient documentation

## 2015-12-29 DIAGNOSIS — I10 Essential (primary) hypertension: Secondary | ICD-10-CM

## 2015-12-29 DIAGNOSIS — Z794 Long term (current) use of insulin: Secondary | ICD-10-CM

## 2015-12-29 DIAGNOSIS — L02211 Cutaneous abscess of abdominal wall: Secondary | ICD-10-CM

## 2015-12-29 LAB — POCT INR: INR: 1.9

## 2015-12-29 LAB — GLUCOSE, POCT (MANUAL RESULT ENTRY): POC Glucose: 180 mg/dl — AB (ref 70–99)

## 2015-12-29 MED ORDER — SPIRONOLACTONE 25 MG PO TABS: 25.0000 mg | ORAL_TABLET | Freq: Every day | ORAL | Status: DC

## 2015-12-29 MED ORDER — METFORMIN HCL 1000 MG PO TABS: 1000.0000 mg | ORAL_TABLET | Freq: Two times a day (BID) | ORAL | Status: DC

## 2015-12-29 MED ORDER — INSULIN GLARGINE 100 UNIT/ML SOLOSTAR PEN: 50.0000 [IU] | PEN_INJECTOR | SUBCUTANEOUS | Status: DC

## 2015-12-29 MED ORDER — HYDRALAZINE HCL 50 MG PO TABS: 50.0000 mg | ORAL_TABLET | Freq: Three times a day (TID) | ORAL | Status: DC

## 2015-12-29 MED ORDER — POTASSIUM CHLORIDE CRYS ER 20 MEQ PO TBCR: 20.0000 meq | EXTENDED_RELEASE_TABLET | Freq: Every day | ORAL | Status: DC

## 2015-12-29 MED ORDER — AMLODIPINE BESYLATE 5 MG PO TABS: 5.0000 mg | ORAL_TABLET | Freq: Every day | ORAL | Status: DC

## 2015-12-29 MED ORDER — CLOPIDOGREL BISULFATE 75 MG PO TABS: 75.0000 mg | ORAL_TABLET | Freq: Every day | ORAL | Status: DC

## 2015-12-29 MED ORDER — CARVEDILOL 12.5 MG PO TABS
12.5000 mg | ORAL_TABLET | Freq: Two times a day (BID) | ORAL | Status: DC
Start: 2015-12-29 — End: 2016-08-03

## 2015-12-29 MED ORDER — ATORVASTATIN CALCIUM 80 MG PO TABS: 80.0000 mg | ORAL_TABLET | Freq: Every day | ORAL | Status: DC

## 2015-12-29 MED ORDER — FUROSEMIDE 40 MG PO TABS: ORAL_TABLET | ORAL | Status: DC

## 2015-12-29 NOTE — Patient Instructions (Signed)

## 2015-12-29 NOTE — Progress Notes (Signed)
Patient is here for FU DM + HTN  Patient denies pain at this time.  Patient has taken medications today and patient has eaten today.   Patient request refills on atorvastatin.

## 2015-12-29 NOTE — Progress Notes (Signed)
Patient ID: [name] [name], male   DOB: September 11, 1955, 60 y.o.   MRN: [id number]   [name] [name], is a 60 y.o. male  [id number]  [id number]  DOB - 03-31-1956  Chief Complaint  Patient presents with  . Follow-up    DM + HTN        Subjective:   [name] [name] is a 60 y.o. male with history of hypertension, type 2 diabetes mellitus requiring insulin, Prilosec, atrial fibrillation status post direct current cardioversion, now normal sinus rhythm, coronary artery disease status post stenting and chronic kidney disease here today for a follow up visit and medication refill after being lost to follow-up for over a year. Patient is currently awaiting surgery/cholecystectomy for cholelithiasis and cholecystitis, patient currently has a cholecystostomy tube in situ, could not proceed with surgery because he is on both Plavix and Coumadin. The cardiologist had approved that patient withhold Plavix preop only after 6 months post-PCI. Patient has appointment coming up soon. Compliant with his medications. He reports no side effects at the moment. Patient drinks beer occasionally, ex-smoker. He has no significant complaints today. He wants to be sure his cholecystostomy tube insertion site is okay and not infected. Patient has No headache, No chest pain, No abdominal pain - No Nausea, No new weakness tingling or numbness, No Cough - SOB.  Problem  Coronary Artery Disease Involving Native Coronary Artery of Native Heart With Angina Pectoris (Hcc)    ALLERGIES: Allergies  Allergen Reactions  . Ace Inhibitors Cough  . Other Hives and Other (See Comments)    Patient reports developing hives after receiving "some antibiotic given in 1980''s at [facility]". He does not know which antibiotic.    PAST MEDICAL HISTORY: Past Medical History  Diagnosis Date  . Hypertension   . Gout   . PAF (paroxysmal atrial fibrillation) (HCC)     On coumadin  . LIVER FUNCTION TESTS, ABNORMAL, HX OF  12/30/2006  . Rectal bleeding 12/18/2011    Scheduled for colonoscopy.    [name] HEPATITIS B, CHRONIC 12/30/2006  . HYPERCHOLESTEROLEMIA 07/11/2010  . MITRAL REGURGITATION 12/30/2006  . COLONIC POLYPS, HX OF 12/30/2006  . CAD (coronary artery disease), native coronary artery     a. Nonobstructive CAD by cath 2013 - diffuse distal and branch vessel CAD, no severe disease in the major coronaries, LV mild global hypokinesis, EF 45%. b. ETT-Sestamibi 5/14: EF 31%, small fixed inferior defect with no ischemia.  . Chronic CHF ([place])     a. Mixed ICM/NICM (?EtOH). EF 35% in 2008. Echo 5/13: EF 60-65%, mod LVH, EF 45% on V gram in 12/2011. EF 12/2012: EF 50-55%, mild LVH, inferobasal HK, mild MR. ETT-Ses 5/14 EF 41%. Cardiac MRI 5/14: EF 44%, mild global HK, subepicardial delayed enhancement in nonspecific RV insertion pattern.  . H/O atrial flutter 2007    a. Ablations in 2007, 2008.  [name] History of medication noncompliance   . History of alcohol abuse   . 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 Heart murmur   . Anginal pain ([place])   . Sleep apnea     "suppose to send mask but they never did" (05/03/2015)  . Type II diabetes mellitus ([place])   . Left sciatic nerve pain since 04/2015  . Anxiety   . Cholecystitis   . Arrhythmia   . History of nuclear stress test     Myoview 3/17: + chest pain; EF 33%, downsloping ST depression 2, V4-6, LVH with strain, no ischemia  on images; intermediate risk     MEDICATIONS AT HOME: Prior to Admission medications   Medication Sig Start Date End Date Taking? Authorizing Provider  acetaminophen (TYLENOL) 500 MG tablet Take 1,000 mg by mouth every 6 (six) hours as needed (pain). Reported on 08/18/2015   Yes Historical Provider, MD  amLODipine (NORVASC) 5 MG tablet Take 1 tablet (5 mg total) by mouth daily. 12/29/15  Yes Tresa Garter, MD  atorvastatin (LIPITOR) 80 MG tablet Take 1 tablet (80 mg total) by mouth daily. 12/29/15  Yes Tresa Garter, MD    carvedilol (COREG) 12.5 MG tablet Take 1 tablet (12.5 mg total) by mouth 2 (two) times daily with a meal. 12/29/15  Yes Tresa Garter, MD  clopidogrel (PLAVIX) 75 MG tablet Take 1 tablet (75 mg total) by mouth daily with breakfast. 12/29/15  Yes Whitfield Dulay E Doreene Burke, MD  furosemide (LASIX) 40 MG tablet TAKE ONE TABLET BY MOUTH IN THE MORNING AND ONE-HALF TABLET IN THE EVENING 12/29/15  Yes Tresa Garter, MD  hydrALAZINE (APRESOLINE) 50 MG tablet Take 1 tablet (50 mg total) by mouth 3 (three) times daily. 12/29/15  Yes Tresa Garter, MD  Insulin Glargine (LANTUS SOLOSTAR) 100 UNIT/ML Solostar Pen Inject 50 Units into the skin every morning. And pen needles 1/day 12/29/15  Yes Tresa Garter, MD  metFORMIN (GLUCOPHAGE) 1000 MG tablet Take 1 tablet (1,000 mg total) by mouth 2 (two) times daily with a meal. 12/29/15  Yes Tresa Garter, MD  nitroGLYCERIN (NITROSTAT) 0.4 MG SL tablet Place 0.4 mg under the tongue every 5 (five) minutes as needed for chest pain (x 3 tabs daily). Reported on 08/18/2015   Yes Historical Provider, MD  potassium chloride SA (K-DUR,KLOR-CON) 20 MEQ tablet Take 1 tablet (20 mEq total) by mouth daily. 12/29/15  Yes Tresa Garter, MD  spironolactone (ALDACTONE) 25 MG tablet Take 1 tablet (25 mg total) by mouth daily. 12/29/15  Yes Tresa Garter, MD  warfarin (COUMADIN) 5 MG tablet Take as directed by coumadin clinic Patient taking differently: Take 2.5-5 mg by mouth daily. Take 2.5mg  by mouth on Tuesday and Sunday. Take 5 mg by mouth on all other days 09/01/15  Yes Larey Dresser, MD     Objective:   Filed Vitals:   12/29/15 1533  BP: 130/68  Pulse: 93  Temp: 98.6 F (37 C)  TempSrc: Oral  Resp: 18  Height: 5' 8.5" (1.74 m)  Weight: 218 lb (98.884 kg)  SpO2: 96%    Exam General appearance : Awake, alert, not in any distress. Speech Clear. Not toxic looking HEENT: Atraumatic and Normocephalic, pupils equally reactive to light and  accomodation Neck: supple, no JVD. No cervical lymphadenopathy.  Chest:Good air entry bilaterally, no added sounds  CVS: S1 S2 regular, no murmurs.  Abdomen: Cholecystostomy Tube and Drainage bag in situ, draining serosanguinous fluid. No evidence of infection or inflammation. Bowel sounds present, Non tender and not distended with no gaurding, rigidity or rebound. Extremities: B/L Lower Ext shows no edema, both legs are warm to touch Neurology: Awake alert, and oriented X 3, CN II-XII intact, Non focal Skin: No Rash  Data Review Lab Results  Component Value Date   HGBA1C 8.0 11/10/2015   HGBA1C 6.8 08/04/2015   HGBA1C 8.5 05/09/2015     Assessment & Plan   1. Type 2 diabetes mellitus with diabetic peripheral angiopathy without gangrene, with long-term current use of insulin (HCC) - Glucose (CBG) - Insulin Glargine (  LANTUS SOLOSTAR) 100 UNIT/ML Solostar Pen; Inject 50 Units into the skin every morning. And pen needles 1/day  Dispense: 30 mL; Refill: 3 - metFORMIN (GLUCOPHAGE) 1000 MG tablet; Take 1 tablet (1,000 mg total) by mouth 2 (two) times daily with a meal.  Dispense: 180 tablet; Refill: 3  Aim for 30 minutes of exercise most days. Rethink what you drink. Water is great! Aim for 2-3 Carb Choices per meal (30-45 grams) +/- 1 either way  Aim for 0-15 Carbs per snack if hungry  Include protein in moderation with your meals and snacks  Consider reading food labels for Total Carbohydrate and Fat Grams of foods  Consider checking BG at alternate times per day  Continue taking medication as directed Be mindful about how much sugar you are adding to beverages and other foods. Fruit Punch - find one with no sugar  Measure and decrease portions of carbohydrate foods  Make your plate and don't go back for seconds  2. Essential hypertension Refill - amLODipine (NORVASC) 5 MG tablet; Take 1 tablet (5 mg total) by mouth daily.  Dispense: 90 tablet; Refill: 3 - hydrALAZINE  (APRESOLINE) 50 MG tablet; Take 1 tablet (50 mg total) by mouth 3 (three) times daily.  Dispense: 270 tablet; Refill: 3 - potassium chloride SA (K-DUR,KLOR-CON) 20 MEQ tablet; Take 1 tablet (20 mEq total) by mouth daily.  Dispense: 90 tablet; Refill: 3  We have discussed target BP range and blood pressure goal. I have advised patient to check BP regularly and to call us back or report to clinic if the numbers are consistently higher than 140/90. We discussed the importance of compliance with medical therapy and DASH diet recommended, consequences of uncontrolled hypertension discussed.   - continue current BP medications  3. Paroxysmal atrial fibrillation (HCC) Refill - carvedilol (COREG) 12.5 MG tablet; Take 1 tablet (12.5 mg total) by mouth 2 (two) times daily with a meal.  Dispense: 180 tablet; Refill: 3 - clopidogrel (PLAVIX) 75 MG tablet; Take 1 tablet (75 mg total) by mouth daily with breakfast.  Dispense: 90 tablet; Refill: 3  4. Coronary artery disease involving native coronary artery of native heart with angina pectoris (HCC) Refill - atorvastatin (LIPITOR) 80 MG tablet; Take 1 tablet (80 mg total) by mouth daily.  Dispense: 90 tablet; Refill: 3 - carvedilol (COREG) 12.5 MG tablet; Take 1 tablet (12.5 mg total) by mouth 2 (two) times daily with a meal.  Dispense: 180 tablet; Refill: 3 - clopidogrel (PLAVIX) 75 MG tablet; Take 1 tablet (75 mg total) by mouth daily with breakfast.  Dispense: 90 tablet; Refill: 3 - spironolactone (ALDACTONE) 25 MG tablet; Take 1 tablet (25 mg total) by mouth daily.  Dispense: 90 tablet; Refill: 3 - furosemide (LASIX) 40 MG tablet; TAKE ONE TABLET BY MOUTH IN THE MORNING AND ONE-HALF TABLET IN THE EVENING  Dispense: 90 tablet; Refill: 3  Patient have been counseled extensively about nutrition and exercise  Return in about 4 weeks (around 01/26/2016) for Hemoglobin A1C and Follow up, DM, Follow up HTN, Follow up Pain and comorbidities.  The patient was  given clear instructions to go to ER or return to medical center if symptoms don't improve, worsen or new problems develop. The patient verbalized understanding. The patient was told to call to get lab results if they haven't heard anything in the next week.   This note has been created with Surveyor, quantity. Any transcriptional errors are unintentional.  Angelica Chessman, MD, [place], Karilyn Cota, [place] [facility] [place], [place]   12/29/2015, 7:08 PM

## 2015-12-30 ENCOUNTER — Encounter (HOSPITAL_COMMUNITY)
Admission: RE | Admit: 2015-12-30 | Discharge: 2015-12-30 | Disposition: A | Discharge status: Home / Self Care | Payer: Medicare Other | Source: Ambulatory Visit | Attending: Cardiology | Admitting: Cardiology

## 2015-12-30 DIAGNOSIS — I509 Heart failure, unspecified: Secondary | ICD-10-CM | POA: Diagnosis not present

## 2015-12-30 DIAGNOSIS — Z955 Presence of coronary angioplasty implant and graft: Secondary | ICD-10-CM

## 2015-12-30 LAB — GLUCOSE, CAPILLARY: Glucose-Capillary: 198 mg/dL — ABNORMAL HIGH (ref 65–99)

## 2016-01-02 ENCOUNTER — Encounter (HOSPITAL_COMMUNITY): Admission: RE | Admit: 2016-01-02 | Payer: Medicare Other | Source: Ambulatory Visit

## 2016-01-04 ENCOUNTER — Encounter (HOSPITAL_COMMUNITY)
Admission: RE | Admit: 2016-01-04 | Discharge: 2016-01-04 | Disposition: A | Discharge status: Home / Self Care | Payer: Medicare Other | Source: Ambulatory Visit | Attending: Cardiology | Admitting: Cardiology

## 2016-01-04 DIAGNOSIS — I509 Heart failure, unspecified: Secondary | ICD-10-CM | POA: Diagnosis not present

## 2016-01-04 DIAGNOSIS — Z955 Presence of coronary angioplasty implant and graft: Secondary | ICD-10-CM

## 2016-01-04 LAB — GLUCOSE, CAPILLARY: GLUCOSE-CAPILLARY: 172 mg/dL — AB (ref 65–99)

## 2016-01-05 NOTE — Progress Notes (Signed)
Cardiac Individual Treatment Plan  Patient Details  Name: [name] [name] MRN: [id number] Date of Birth: Sep 06, 1955 Referring Provider:        CARDIAC REHAB PHASE II EXERCISE from 11/23/2015 in [place]   Referring Provider  Loralie Champagne MD      Initial Encounter Date:       CARDIAC REHAB PHASE II EXERCISE from 11/23/2015 in [place]   Date  10/27/15   Referring Provider  Loralie Champagne MD      Visit Diagnosis: Stented coronary artery  S/P coronary artery stent placement  Patient's Home Medications on Admission:  Current outpatient prescriptions:  .  acetaminophen (TYLENOL) 500 MG tablet, Take 1,000 mg by mouth every 6 (six) hours as needed (pain). Reported on 08/18/2015, Disp: , Rfl:  .  amLODipine (NORVASC) 5 MG tablet, Take 1 tablet (5 mg total) by mouth daily., Disp: 90 tablet, Rfl: 3 .  atorvastatin (LIPITOR) 80 MG tablet, Take 1 tablet (80 mg total) by mouth daily., Disp: 90 tablet, Rfl: 3 .  carvedilol (COREG) 12.5 MG tablet, Take 1 tablet (12.5 mg total) by mouth 2 (two) times daily with a meal., Disp: 180 tablet, Rfl: 3 .  clopidogrel (PLAVIX) 75 MG tablet, Take 1 tablet (75 mg total) by mouth daily with breakfast., Disp: 90 tablet, Rfl: 3 .  furosemide (LASIX) 40 MG tablet, TAKE ONE TABLET BY MOUTH IN THE MORNING AND ONE-HALF TABLET IN THE EVENING, Disp: 90 tablet, Rfl: 3 .  hydrALAZINE (APRESOLINE) 50 MG tablet, Take 1 tablet (50 mg total) by mouth 3 (three) times daily., Disp: 270 tablet, Rfl: 3 .  Insulin Glargine (LANTUS SOLOSTAR) 100 UNIT/ML Solostar Pen, Inject 50 Units into the skin every morning. And pen needles 1/day, Disp: 30 mL, Rfl: 3 .  metFORMIN (GLUCOPHAGE) 1000 MG tablet, Take 1 tablet (1,000 mg total) by mouth 2 (two) times daily with a meal., Disp: 180 tablet, Rfl: 3 .  nitroGLYCERIN (NITROSTAT) 0.4 MG SL tablet, Place 0.4 mg under the tongue every 5 (five) minutes as needed for chest pain  (x 3 tabs daily). Reported on 08/18/2015, Disp: , Rfl:  .  potassium chloride SA (K-DUR,KLOR-CON) 20 MEQ tablet, Take 1 tablet (20 mEq total) by mouth daily., Disp: 90 tablet, Rfl: 3 .  spironolactone (ALDACTONE) 25 MG tablet, Take 1 tablet (25 mg total) by mouth daily., Disp: 90 tablet, Rfl: 3 .  warfarin (COUMADIN) 5 MG tablet, Take as directed by coumadin clinic (Patient taking differently: Take 2.5-5 mg by mouth daily. Take 2.5mg  by mouth on Tuesday and Sunday. Take 5 mg by mouth on all other days), Disp: 90 tablet, Rfl: 1  Past Medical History: Past Medical History  Diagnosis Date  . Hypertension   . Gout   . PAF (paroxysmal atrial fibrillation) (HCC)     On coumadin  . LIVER FUNCTION TESTS, ABNORMAL, HX OF 12/30/2006  . Rectal bleeding 12/18/2011    Scheduled for colonoscopy.    [name] HEPATITIS B, CHRONIC 12/30/2006  . HYPERCHOLESTEROLEMIA 07/11/2010  . MITRAL REGURGITATION 12/30/2006  . COLONIC POLYPS, HX OF 12/30/2006  . CAD (coronary artery disease), native coronary artery     a. Nonobstructive CAD by cath 2013 - diffuse distal and branch vessel CAD, no severe disease in the major coronaries, LV mild global hypokinesis, EF 45%. b. ETT-Sestamibi 5/14: EF 31%, small fixed inferior defect with no ischemia.  . Chronic CHF ([place])     a. Mixed ICM/NICM (?EtOH).  EF 35% in 2008. Echo 5/13: EF 60-65%, mod LVH, EF 45% on V gram in 12/2011. EF 12/2012: EF 50-55%, mild LVH, inferobasal HK, mild MR. ETT-Ses 5/14 EF 41%. Cardiac MRI 5/14: EF 44%, mild global HK, subepicardial delayed enhancement in nonspecific RV insertion pattern.  . H/O atrial flutter 2007    a. Ablations in 2007, 2008.  [name] History of medication noncompliance   . History of alcohol abuse   . 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 Heart murmur   . Anginal pain ([place])   . Sleep apnea     "suppose to send mask but they never did" (05/03/2015)  . Type II diabetes mellitus ([place])   . Left sciatic nerve pain since 04/2015  .  Anxiety   . Cholecystitis   . Arrhythmia   . History of nuclear stress test     Myoview 3/17: + chest pain; EF 33%, downsloping ST depression 2, V4-6, LVH with strain, no ischemia on images; intermediate risk     Tobacco Use: History  Smoking status  . Former Smoker -- 4.00 packs/day for 18 years  . Types: Cigarettes  . Quit date: 08/14/1983  Smokeless tobacco  . Never Used    Labs: Recent Review Flowsheet Data    Labs for ITP Cardiac and Pulmonary Rehab Latest Ref Rng 05/03/2015 05/09/2015 07/12/2015 08/04/2015 11/10/2015   Cholestrol 125 - 200 mg/dL - - 104(L) - -   LDLCALC <130 mg/dL - - 44 - -   HDL >=40 mg/dL - - 44 - -   Trlycerides <150 mg/dL - - 78 - -   Hemoglobin A1c - - 8.5 - 6.8 8.0   HCO3 20.0 - 24.0 mEq/L 23.6 - - - -   TCO2 0 - 100 mmol/L 25 - - - -   ACIDBASEDEF 0.0 - 2.0 mmol/L 1.0 - - - -   O2SAT - 68.0 - - - -      Capillary Blood Glucose: Lab Results  Component Value Date   GLUCAP 172* 01/04/2016   GLUCAP 198* 12/30/2015   GLUCAP 245* 12/26/2015   GLUCAP 240* 12/23/2015   GLUCAP 204* 12/21/2015     Exercise Target Goals:    Exercise Program Goal: Individual exercise prescription set with THRR, safety & activity barriers. Participant demonstrates ability to understand and report RPE using BORG scale, to self-measure pulse accurately, and to acknowledge the importance of the exercise prescription.  Exercise Prescription Goal: Starting with aerobic activity 30 plus minutes a day, 3 days per week for initial exercise prescription. Provide home exercise prescription and guidelines that participant acknowledges understanding prior to discharge.  Activity Barriers & Risk Stratification:     Activity Barriers & Cardiac Risk Stratification - 10/27/15 0751    Activity Barriers & Cardiac Risk Stratification   Activity Barriers Deconditioning;Muscular Weakness   Cardiac Risk Stratification High      6 Minute Walk:     6 Minute Walk      10/27/15  0810       6 Minute Walk   Phase Initial     Distance 1838 feet     Walk Time 6 minutes     # of Rest Breaks 0     MPH 3.48     METS 4.76     RPE 13     VO2 Peak 16.67     Symptoms Yes (comment)     Comments chest pain 6/10, no medication yet this morning  Resting HR 83 bpm     Resting BP 124/60 mmHg     Max Ex. HR 116 bpm     Max Ex. BP 178/104 mmHg     2 Minute Post BP 148/100 mmHg  130/92     Pre/Post BP   Baseline BP 124/60 mmHg     6 Minute BP 178/104 mmHg     Pre/Post BP? Yes        Initial Exercise Prescription:     Initial Exercise Prescription - 11/24/15 1400    Date of Initial Exercise RX and Referring Provider   Date 10/27/15   Referring Provider Loralie Champagne MD   Bike   Level 1.2  was reduced to 0.6 due to HTN   Minutes 10   METs 3.31   NuStep   Level 3   Minutes 10   METs 1.6   Track   Laps 14   Minutes 10   METs 3.45   Resistance Training   Training Prescription Yes   Weight 3 lbs   Reps 10-12      Perform Capillary Blood Glucose checks as needed.  Exercise Prescription Changes:     Exercise Prescription Changes      11/15/15 0800 11/25/15 1200 11/29/15 0900 12/13/15 1000 12/30/15 1115   Exercise Review   Progression Yes  Yes Yes Yes   Response to Exercise   Blood Pressure (Admit) 140/90 mmHg  122/70 mmHg 144/82 mmHg 118/70 mmHg   Blood Pressure (Exercise) 180/100 mmHg  182/70 mmHg 150/78 mmHg 124/80 mmHg   Blood Pressure (Exit) 140/76 mmHg  108/60 mmHg 138/80 mmHg 116/78 mmHg   Heart Rate (Admit) 95 bpm  76 bpm 86 bpm 89 bpm   Heart Rate (Exercise) 126 bpm  109 bpm 119 bpm 112 bpm   Heart Rate (Exit) 93 bpm  83 bpm 86 bpm 89 bpm   Rating of Perceived Exertion (Exercise) 15  12 12 13    Symptoms exertional hypertension  exertional hypertension on bike hypertension    Comments  Home Exercise Given 11/25/15 Home Exercise Given 11/25/15 Home Exercise Given 11/25/15 Home Exercise Given 11/25/15   Duration Progress to 30 minutes of  continuous aerobic without signs/symptoms of physical distress  Progress to 30 minutes of continuous aerobic without signs/symptoms of physical distress Progress to 30 minutes of continuous aerobic without signs/symptoms of physical distress Progress to 30 minutes of continuous aerobic without signs/symptoms of physical distress   Intensity THRR unchanged  THRR unchanged THRR unchanged THRR unchanged   Progression   Progression Continue to progress workloads to maintain intensity without signs/symptoms of physical distress.  Continue to progress workloads to maintain intensity without signs/symptoms of physical distress. Continue to progress workloads to maintain intensity without signs/symptoms of physical distress. Continue to progress workloads to maintain intensity without signs/symptoms of physical distress.   Average METs 2.8  2.98 3.6 3.7   Resistance Training   Training Prescription Yes Yes Yes Yes Yes   Weight 3 lbs 3 lbs 3 lbs 3 lbs 5lbs.   Reps 10-12 10-12 10-12 10-12 10-12   Interval Training   Interval Training No  No No No   Bike   Level 1.2  was reduced to 0.6 due to HTN 1.2  was reduced to 0.6 due to HTN 0.6 1.8 1.5   Minutes 10 10 10  1.8  was reduced back to 1.5 due to elevated bp 10   METs 3.31 3.31 2.17 4.47 3.88   NuStep   Level  3 3 3 3 3    Minutes 10 10 10 10 10    METs 1.6 1.6 3.2 3 3.7   Track   Laps 14 14 16 16 17    Minutes 10 10 10 10 10    METs 3.45 3.45 3.76 3.76 3.95   Home Exercise Plan   Plans to continue exercise at  [employer] [facility] [employer] (comment)  [place] (comment)  [place] (comment)  [place] (comment)   Frequency  Add 2 additional days to program exercise sessions. Add 2 additional days to program exercise sessions. Add 2 additional days to program exercise sessions. Add 2 additional days to program exercise sessions.      Exercise Comments:     Exercise Comments      11/29/15  0917 12/13/15 1024 01/02/16 0923       Exercise Comments Pt is tolerating exercise well and continues to make progress.  He has had some hypertensive responses on the bike. Pt is still having some hypertension issue, especially on bike, but it is improving. Making gradual progress with exercise.        Discharge Exercise Prescription (Final Exercise Prescription Changes):     Exercise Prescription Changes - 12/30/15 1115    Exercise Review   Progression Yes   Response to Exercise   Blood Pressure (Admit) 118/70 mmHg   Blood Pressure (Exercise) 124/80 mmHg   Blood Pressure (Exit) 116/78 mmHg   Heart Rate (Admit) 89 bpm   Heart Rate (Exercise) 112 bpm   Heart Rate (Exit) 89 bpm   Rating of Perceived Exertion (Exercise) 13   Comments Home Exercise Given 11/25/15   Duration Progress to 30 minutes of continuous aerobic without signs/symptoms of physical distress   Intensity THRR unchanged   Progression   Progression Continue to progress workloads to maintain intensity without signs/symptoms of physical distress.   Average METs 3.7   Resistance Training   Training Prescription Yes   Weight 5lbs.   Reps 10-12   Interval Training   Interval Training No   Bike   Level 1.5   Minutes 10   METs 3.88   NuStep   Level 3   Minutes 10   METs 3.7   Track   Laps 17   Minutes 10   METs 3.95   Home Exercise Plan   Plans to continue exercise at [facility] (comment)   Frequency Add 2 additional days to program exercise sessions.      Nutrition:  Target Goals: Understanding of nutrition guidelines, daily intake of sodium 1500mg , cholesterol 200mg , calories 30% from fat and 7% or less from saturated fats, daily to have 5 or more servings of fruits and vegetables.  Biometrics:     Pre Biometrics - 10/27/15 1026    Pre Biometrics   Height 5\' 11"  (1.803 m)   Weight 218 lb 7.6 oz (99.1 kg)   Waist Circumference 41 inches   Hip Circumference 39 inches   Waist to Hip Ratio  1.05 %   BMI (Calculated) 30.5   Triceps Skinfold 12 mm   % Body Fat 27.6 %   Grip Strength 46 kg   Flexibility 11.5 in   Single Leg Stand 30 seconds       Nutrition Therapy Plan and Nutrition Goals:     Nutrition Therapy & Goals - 10/27/15 0946    Nutrition Therapy   Diet Diabetic, Therapeutic Lifestyle Changes   Drug/Food Interactions Coumadin/Vit K   Personal  Nutrition Goals   Personal Goal #1 6-12 lb wt loss to a goal wt of 205 lb at graduation from [place], educate and counsel regarding individualized specific dietary modifications aiming towards targeted core components such as weight, hypertension, lipid management, diabetes, heart failure and other comorbidities.   Expected Outcomes Short Term Goal: Understand basic principles of dietary content, such as calories, fat, sodium, cholesterol and nutrients.;Long Term Goal: Adherence to prescribed nutrition plan.      Nutrition Discharge: Nutrition Scores:     Nutrition Assessments - 11/18/15 1337    MEDFICTS Scores   Pre Score 45      Nutrition Goals Re-Evaluation:   Psychosocial: Target Goals: Acknowledge presence or absence of depression, maximize coping skills, provide positive support system. Participant is able to verbalize types and ability to use techniques and skills needed for reducing stress and depression.  Initial Review & Psychosocial Screening:   Quality of Life Scores:     Quality of Life - 10/27/15 1113    Quality of Life Scores   Health/Function Pre 19.77 %   Socioeconomic Pre 20.42 %   Psych/Spiritual Pre 18.93 %   Family Pre 22.8 %   GLOBAL Pre 20.17 %      PHQ-9:     Recent Review Flowsheet Data    Depression screen [facility] 2/9 12/29/2015 11/11/2015 08/18/2015 05/26/2015   Decreased Interest 1 0 0 0   Down, Depressed, Hopeless 0 0 0 0   PHQ - 2 Score 1 0 0 0   Altered sleeping 1 - - -   Tired, decreased energy 1 - - -   Change in  appetite 1 - - -   Feeling bad or failure about yourself  1 - - -   Trouble concentrating 0 - - -   Moving slowly or fidgety/restless 0 - - -   Suicidal thoughts 0 - - -   PHQ-9 Score 5 - - -      Psychosocial Evaluation and Intervention:   Psychosocial Re-Evaluation:   Vocational Rehabilitation: Provide vocational rehab assistance to qualifying candidates.   Vocational Rehab Evaluation & Intervention:     Vocational Rehab - 11/11/15 1156    Initial Vocational Rehab Evaluation & Intervention   Assessment shows need for Vocational Rehabilitation No  pt denies the need for vocational rehab.  Pt is disabled and has been for the last 3 years.      Education: Education Goals: Education classes will be provided on a weekly basis, covering required topics. Participant will state understanding/return demonstration of topics presented.  Learning Barriers/Preferences:   Education Topics: Count Your Pulse:  -Group instruction provided by verbal instruction, demonstration, patient participation and written materials to support subject.  Instructors address importance of being able to find your pulse and how to count your pulse when at home without a heart monitor.  Patients get hands on experience counting their pulse with staff help and individually.          CARDIAC REHAB PHASE II EXERCISE from 01/04/2016 in [place]   Date  12/16/15   Educator  Andi Hence   Instruction Review Code  R- Review/reinforce [Second Class for practice]      Heart Attack, Angina, and Risk Factor Modification:  -Group instruction provided by verbal instruction, video, and written materials to support subject.  Instructors address signs and symptoms of angina and heart attacks.    Also discuss  risk factors for heart disease and how to make changes to improve heart health risk factors.      CARDIAC REHAB PHASE II EXERCISE from 01/04/2016 in [place]   Date  12/14/15   Instruction Review Code  2- meets goals/outcomes      Functional Fitness:  -Group instruction provided by verbal instruction, demonstration, patient participation, and written materials to support subject.  Instructors address safety measures for doing things around the house.  Discuss how to get up and down off the floor, how to pick things up properly, how to safely get out of a chair without assistance, and balance training.      CARDIAC REHAB PHASE II EXERCISE from 01/04/2016 in [place]   Date  12/02/15   Instruction Review Code  2- meets goals/outcomes      Meditation and Mindfulness:  -Group instruction provided by verbal instruction, patient participation, and written materials to support subject.  Instructor addresses importance of mindfulness and meditation practice to help reduce stress and improve awareness.  Instructor also leads participants through a meditation exercise.       CARDIAC REHAB PHASE II EXERCISE from 01/04/2016 in [place]   Date  01/04/16   Educator  Jeanella Craze   Instruction Review Code  2- meets goals/outcomes      Stretching for Flexibility and Mobility:  -Group instruction provided by verbal instruction, patient participation, and written materials to support subject.  Instructors lead participants through series of stretches that are designed to increase flexibility thus improving mobility.  These stretches are additional exercise for major muscle groups that are typically performed during regular warm up and cool down.      CARDIAC REHAB PHASE II EXERCISE from 01/04/2016 in [place]   Date  11/25/15   Educator  Alberteen Sam, MA ,ACSM RCEP   Instruction Review Code  2- meets goals/outcomes      Hands Only CPR Anytime:  -Group instruction provided by verbal instruction, video, patient participation and written  materials to support subject.  Instructors co-teach with AHA video for hands only CPR.  Participants get hands on experience with mannequins.   Nutrition I class: Heart Healthy Eating:  -Group instruction provided by PowerPoint slides, verbal discussion, and written materials to support subject matter. The instructor gives an explanation and review of the Therapeutic Lifestyle Changes diet recommendations, which includes a discussion on lipid goals, dietary fat, sodium, fiber, plant stanol/sterol esters, sugar, and the components of a well-balanced, healthy diet.      CARDIAC REHAB PHASE II EXERCISE from 01/04/2016 in [place]   Date  11/18/15   Educator  RD   Instruction Review Code  Not applicable      Nutrition II class: Lifestyle Skills:  -Group instruction provided by PowerPoint slides, verbal discussion, and written materials to support subject matter. The instructor gives an explanation and review of label reading, grocery shopping for heart health, heart healthy recipe modifications, and ways to make healthier choices when eating out.      CARDIAC REHAB PHASE II EXERCISE from 01/04/2016 in [place]   Date  11/18/15   Educator  RD   Instruction Review Code  Not applicable      Diabetes Question & Answer:  -Group instruction provided by PowerPoint slides, verbal discussion, and written materials to support subject matter. The instructor  gives an explanation and review of diabetes co-morbidities, pre- and post-prandial blood glucose goals, pre-exercise blood glucose goals, signs, symptoms, and treatment of hypoglycemia and hyperglycemia, and foot care basics.      CARDIAC REHAB PHASE II EXERCISE from 01/04/2016 in [place]   Date  12/09/15   Educator  RD   Instruction Review Code  2- meets goals/outcomes      Diabetes Blitz:  -Group instruction provided by PowerPoint slides, verbal  discussion, and written materials to support subject matter. The instructor gives an explanation and review of the physiology behind type 1 and type 2 diabetes, diabetes medications and rational behind using different medications, pre- and post-prandial blood glucose recommendations and Hemoglobin A1c goals, diabetes diet, and exercise including blood glucose guidelines for exercising safely.       CARDIAC REHAB PHASE II EXERCISE from 01/04/2016 in [place]   Date  11/18/15   Educator  RD   Instruction Review Code  Not applicable      Portion Distortion:  -Group instruction provided by PowerPoint slides, verbal discussion, written materials, and food models to support subject matter. The instructor gives an explanation of serving size versus portion size, changes in portions sizes over the last 20 years, and what consists of a serving from each food group.   Stress Management:  -Group instruction provided by verbal instruction, video, and written materials to support subject matter.  Instructors review role of stress in heart disease and how to cope with stress positively.        CARDIAC REHAB PHASE II EXERCISE from 01/04/2016 in [place]   Date  11/30/15   Instruction Review Code  2- meets goals/outcomes      Exercising on Your Own:  -Group instruction provided by verbal instruction, power point, and written materials to support subject.  Instructors discuss benefits of exercise, components of exercise, frequency and intensity of exercise, and end points for exercise.  Also discuss use of nitroglycerin and activating EMS.  Review options of places to exercise outside of rehab.  Review guidelines for sex with heart disease.   Cardiac Drugs I:  -Group instruction provided by verbal instruction and written materials to support subject.  Instructor reviews cardiac drug classes: antiplatelets, anticoagulants, beta blockers, and  statins.  Instructor discusses reasons, side effects, and lifestyle considerations for each drug class.      CARDIAC REHAB PHASE II EXERCISE from 01/04/2016 in [place]   Date  11/23/15   Educator  Pharm D   Instruction Review Code  2- meets goals/outcomes      Cardiac Drugs II:  -Group instruction provided by verbal instruction and written materials to support subject.  Instructor reviews cardiac drug classes: angiotensin converting enzyme inhibitors (ACE-I), angiotensin II receptor blockers (ARBs), nitrates, and calcium channel blockers.  Instructor discusses reasons, side effects, and lifestyle considerations for each drug class.      CARDIAC REHAB PHASE II EXERCISE from 01/04/2016 in [place]   Date  12/21/15   Educator  Pharm D   Instruction Review Code  2- meets goals/outcomes      Anatomy and Physiology of the Circulatory System:  -Group instruction provided by verbal instruction, video, and written materials to support subject.  Reviews functional anatomy of heart, how it relates to various diagnoses, and what role the heart plays in the overall system.   Knowledge Questionnaire  Score:     Knowledge Questionnaire Score - 11/18/15 1212    Knowledge Questionnaire Score   Pre Score 20/24               DM score 14/15      Core Components/Risk Factors/Patient Goals at Admission:     Personal Goals and Risk Factors at Admission - 10/27/15 0752    Core Components/Risk Factors/Patient Goals on Admission    Weight Management Yes   Intervention Weight Management: Develop a combined nutrition and exercise program designed to reach desired caloric intake, while maintaining appropriate intake of nutrient and fiber, sodium and fats, and appropriate energy expenditure required for the weight goal.;Weight Management: Provide education and appropriate resources to help participant work on and attain dietary goals.   Admit  Weight 218 lb 7.6 oz (99.1 kg)   Goal Weight: Short Term 209 lb 7 oz (95 kg)   Goal Weight: Long Term 200 lb (90.719 kg)   Expected Outcomes Long Term: Adherence to nutrition and physical activity/exercise program aimed toward attainment of established weight goal.;Short Term: Continue to assess and modify interventions until short term weight is achieved.   Sedentary Yes  Increase stamina and cardio capacity   Intervention Provide advice, education, support and counseling about physical activity/exercise needs.;Develop an individualized exercise prescription for aerobic and resistive training based on initial evaluation findings, risk stratification, comorbidities and participant's personal goals.   Expected Outcomes Achievement of increased cardiorespiratory fitness and enhanced flexibility, muscular endurance and strength shown through measurements of functional capaciy and personal statement of participant.   Diabetes Yes   Intervention Provide education about signs/symptoms and action to take for hypo/hyperglycemia.;Provide education about proper nutrition, including hydration, and aerobic/resistive exercise prescription along with prescribed medications to achieve blood glucose in normal ranges: Fasting glucose 65-99 mg/dL   Expected Outcomes Short Term: Participant verbalizes understanding of the signs/symptoms and immediate care of hyper/hypoglycemia, proper foot care and importance of medication, aerobic/resistive exercise and nutrition plan for blood glucose control.;Long Term: Attainment of HbA1C < 7%.   Hypertension Yes   Intervention Provide education on lifestyle modifcations including regular physical activity/exercise, weight management, moderate sodium restriction and increased consumption of fresh fruit, vegetables, and low fat dairy, alcohol moderation, and smoking cessation.;Monitor prescription use compliance.   Expected Outcomes Short Term: Continued assessment and intervention  until BP is < 140/55mm HG in hypertensive participants. < 130/34mm HG in hypertensive participants with diabetes, heart failure or chronic kidney disease.;Long Term: Maintenance of blood pressure at goal levels.   Lipids Yes   Intervention Provide education and support for participant on nutrition & aerobic/resistive exercise along with prescribed medications to achieve LDL 70mg , HDL >40mg .   Expected Outcomes Short Term: Participant states understanding of desired cholesterol values and is compliant with medications prescribed. Participant is following exercise prescription and nutrition guidelines.;Long Term: Cholesterol controlled with medications as prescribed, with individualized exercise RX and with personalized nutrition plan. Value goals: LDL < 70mg , HDL > 40 mg.      Core Components/Risk Factors/Patient Goals Review:      Goals and Risk Factor Review      11/30/15 1202 01/02/16 0921         Core Components/Risk Factors/Patient Goals Review   Personal Goals Review Weight Management/Obesity;Sedentary;Diabetes;Hypertension Sedentary      Review He has lost some weight (down to 97.5 kg). He is working out in yard, but has yet to starting a walking program (plans to start soon).  His blood sugars and  blood pressures still tend to fluctuate and will continue to be monitored Pt has noticed an improvement in his stamina. Home exercise is inconsistent.      Expected Outcomes Conintued weight loss and stabilize blood surgas and blood pressures. Consistently walk 1 to 2 days at home.         Core Components/Risk Factors/Patient Goals at Discharge (Final Review):      Goals and Risk Factor Review - 01/02/16 0921    Core Components/Risk Factors/Patient Goals Review   Personal Goals Review Sedentary   Review Pt has noticed an improvement in his stamina. Home exercise is inconsistent.   Expected Outcomes Consistently walk 1 to 2 days at home.      ITP Comments:     ITP Comments       10/27/15 0751           ITP Comments Medical Director-Dr. Fransico Him, MD          Comments:  Pt is making expected progress toward personal goals after completing 21 sessions. Recommend continued exercise and life style modification education including  stress management and relaxation techniques to decrease cardiac risk profile. Cherre Huger, BSN

## 2016-01-06 ENCOUNTER — Encounter (HOSPITAL_COMMUNITY)
Admission: RE | Admit: 2016-01-06 | Discharge: 2016-01-06 | Disposition: A | Discharge status: Home / Self Care | Payer: Medicare Other | Source: Ambulatory Visit | Attending: Cardiology | Admitting: Cardiology

## 2016-01-06 DIAGNOSIS — I509 Heart failure, unspecified: Secondary | ICD-10-CM | POA: Diagnosis not present

## 2016-01-06 DIAGNOSIS — Z955 Presence of coronary angioplasty implant and graft: Secondary | ICD-10-CM | POA: Diagnosis not present

## 2016-01-06 LAB — GLUCOSE, CAPILLARY: GLUCOSE-CAPILLARY: 139 mg/dL — AB (ref 65–99)

## 2016-01-09 ENCOUNTER — Encounter (HOSPITAL_COMMUNITY): Payer: Medicare Other

## 2016-01-11 ENCOUNTER — Encounter (HOSPITAL_COMMUNITY)
Admission: RE | Admit: 2016-01-11 | Discharge: 2016-01-11 | Disposition: A | Discharge status: Home / Self Care | Payer: Medicare Other | Source: Ambulatory Visit | Attending: Cardiology | Admitting: Cardiology

## 2016-01-11 ENCOUNTER — Telehealth: Payer: Self-pay | Admitting: Cardiology

## 2016-01-11 DIAGNOSIS — I509 Heart failure, unspecified: Secondary | ICD-10-CM | POA: Diagnosis not present

## 2016-01-11 DIAGNOSIS — Z955 Presence of coronary angioplasty implant and graft: Secondary | ICD-10-CM

## 2016-01-11 LAB — GLUCOSE, CAPILLARY: GLUCOSE-CAPILLARY: 253 mg/dL — AB (ref 65–99)

## 2016-01-11 NOTE — Telephone Encounter (Signed)
NeW Message  Pt calling to speak w/ RN- would not specify nature of phone call. Please call back and discuss.

## 2016-01-11 NOTE — Telephone Encounter (Signed)
Pt is asking about cardiac clearance from Dr Aundra Dubin for laparoscopic cholecystectomy.  Pt advised per Dr Gertha Calkin Aundra Dubin will see pt at upcoming appt 01/20/16 and make a recommendation about cardiac clearance at that time.

## 2016-01-12 ENCOUNTER — Telehealth: Payer: Self-pay | Admitting: *Deleted

## 2016-01-12 ENCOUNTER — Other Ambulatory Visit: Payer: Medicare Other

## 2016-01-12 ENCOUNTER — Inpatient Hospital Stay: Admission: RE | Admit: 2016-01-12 | Payer: Medicare Other | Source: Ambulatory Visit

## 2016-01-12 NOTE — Telephone Encounter (Signed)
I called patient because he was a no show for his drain check. The patient states he was called Wed and told not to f/u with the drain appt they are going to be doing surgery, he was unsure whom he spoke with. Cathren Harsh

## 2016-01-13 ENCOUNTER — Emergency Department (HOSPITAL_COMMUNITY): Payer: Medicare Other

## 2016-01-13 ENCOUNTER — Encounter (HOSPITAL_COMMUNITY): Payer: Self-pay | Admitting: Adult Health

## 2016-01-13 ENCOUNTER — Encounter (HOSPITAL_COMMUNITY): Payer: Medicare Other

## 2016-01-13 ENCOUNTER — Observation Stay (HOSPITAL_COMMUNITY)
Admission: EM | Admit: 2016-01-13 | Discharge: 2016-01-13 | Disposition: A | Discharge status: Home / Self Care | Payer: Medicare Other | Attending: Internal Medicine | Admitting: Internal Medicine

## 2016-01-13 ENCOUNTER — Observation Stay (HOSPITAL_BASED_OUTPATIENT_CLINIC_OR_DEPARTMENT_OTHER): Payer: Medicare Other

## 2016-01-13 DIAGNOSIS — K625 Hemorrhage of anus and rectum: Secondary | ICD-10-CM | POA: Diagnosis not present

## 2016-01-13 DIAGNOSIS — Z7902 Long term (current) use of antithrombotics/antiplatelets: Secondary | ICD-10-CM | POA: Insufficient documentation

## 2016-01-13 DIAGNOSIS — Z9119 Patient's noncompliance with other medical treatment and regimen: Secondary | ICD-10-CM | POA: Insufficient documentation

## 2016-01-13 DIAGNOSIS — I5189 Other ill-defined heart diseases: Secondary | ICD-10-CM | POA: Diagnosis present

## 2016-01-13 DIAGNOSIS — I5032 Chronic diastolic (congestive) heart failure: Secondary | ICD-10-CM | POA: Diagnosis not present

## 2016-01-13 DIAGNOSIS — Z7984 Long term (current) use of oral hypoglycemic drugs: Secondary | ICD-10-CM | POA: Insufficient documentation

## 2016-01-13 DIAGNOSIS — I10 Essential (primary) hypertension: Secondary | ICD-10-CM | POA: Diagnosis present

## 2016-01-13 DIAGNOSIS — I509 Heart failure, unspecified: Secondary | ICD-10-CM | POA: Diagnosis not present

## 2016-01-13 DIAGNOSIS — I480 Paroxysmal atrial fibrillation: Secondary | ICD-10-CM | POA: Diagnosis not present

## 2016-01-13 DIAGNOSIS — Z01810 Encounter for preprocedural cardiovascular examination: Secondary | ICD-10-CM | POA: Diagnosis not present

## 2016-01-13 DIAGNOSIS — G4730 Sleep apnea, unspecified: Secondary | ICD-10-CM | POA: Insufficient documentation

## 2016-01-13 DIAGNOSIS — I499 Cardiac arrhythmia, unspecified: Secondary | ICD-10-CM | POA: Insufficient documentation

## 2016-01-13 DIAGNOSIS — E119 Type 2 diabetes mellitus without complications: Secondary | ICD-10-CM

## 2016-01-13 DIAGNOSIS — F419 Anxiety disorder, unspecified: Secondary | ICD-10-CM | POA: Diagnosis not present

## 2016-01-13 DIAGNOSIS — M109 Gout, unspecified: Secondary | ICD-10-CM | POA: Diagnosis not present

## 2016-01-13 DIAGNOSIS — R0789 Other chest pain: Secondary | ICD-10-CM

## 2016-01-13 DIAGNOSIS — I25110 Atherosclerotic heart disease of native coronary artery with unstable angina pectoris: Secondary | ICD-10-CM | POA: Diagnosis present

## 2016-01-13 DIAGNOSIS — E7800 Pure hypercholesterolemia, unspecified: Secondary | ICD-10-CM | POA: Diagnosis present

## 2016-01-13 DIAGNOSIS — Z7901 Long term (current) use of anticoagulants: Secondary | ICD-10-CM | POA: Insufficient documentation

## 2016-01-13 DIAGNOSIS — Z87891 Personal history of nicotine dependence: Secondary | ICD-10-CM | POA: Diagnosis not present

## 2016-01-13 DIAGNOSIS — Z8619 Personal history of other infectious and parasitic diseases: Secondary | ICD-10-CM | POA: Insufficient documentation

## 2016-01-13 DIAGNOSIS — R011 Cardiac murmur, unspecified: Secondary | ICD-10-CM | POA: Insufficient documentation

## 2016-01-13 DIAGNOSIS — R079 Chest pain, unspecified: Secondary | ICD-10-CM

## 2016-01-13 DIAGNOSIS — I25119 Atherosclerotic heart disease of native coronary artery with unspecified angina pectoris: Secondary | ICD-10-CM | POA: Insufficient documentation

## 2016-01-13 DIAGNOSIS — R0602 Shortness of breath: Secondary | ICD-10-CM | POA: Diagnosis not present

## 2016-01-13 DIAGNOSIS — Z85830 Personal history of malignant neoplasm of bone: Secondary | ICD-10-CM | POA: Insufficient documentation

## 2016-01-13 DIAGNOSIS — K819 Cholecystitis, unspecified: Secondary | ICD-10-CM | POA: Diagnosis not present

## 2016-01-13 DIAGNOSIS — Z794 Long term (current) use of insulin: Secondary | ICD-10-CM | POA: Diagnosis not present

## 2016-01-13 DIAGNOSIS — K8020 Calculus of gallbladder without cholecystitis without obstruction: Secondary | ICD-10-CM | POA: Diagnosis present

## 2016-01-13 DIAGNOSIS — R1011 Right upper quadrant pain: Secondary | ICD-10-CM | POA: Diagnosis not present

## 2016-01-13 LAB — BASIC METABOLIC PANEL
Anion gap: 10 (ref 5–15)
BUN: 19 mg/dL (ref 6–20)
CALCIUM: 9.4 mg/dL (ref 8.9–10.3)
CHLORIDE: 103 mmol/L (ref 101–111)
CO2: 22 mmol/L (ref 22–32)
Creatinine, Ser: 1.52 mg/dL — ABNORMAL HIGH (ref 0.61–1.24)
GFR calc non Af Amer: 48 mL/min — ABNORMAL LOW (ref >60)
GFR, EST AFRICAN AMERICAN: 55 mL/min — AB (ref >60)
GLUCOSE: 125 mg/dL — AB (ref 65–99)
Potassium: 3.4 mmol/L — ABNORMAL LOW (ref 3.5–5.1)
Sodium: 135 mmol/L (ref 135–145)

## 2016-01-13 LAB — CBC
HCT: 42 % (ref 39.0–52.0)
Hemoglobin: 13.5 g/dL (ref 13.0–17.0)
MCH: 27 pg (ref 26.0–34.0)
MCHC: 32.1 g/dL (ref 30.0–36.0)
MCV: 84 fL (ref 78.0–100.0)
PLATELETS: 171 10*3/uL (ref 150–400)
RBC: 5 MIL/uL (ref 4.22–5.81)
RDW: 15.6 % — AB (ref 11.5–15.5)
WBC: 7.5 10*3/uL (ref 4.0–10.5)

## 2016-01-13 LAB — GLUCOSE, CAPILLARY
Glucose-Capillary: 129 mg/dL — ABNORMAL HIGH (ref 65–99)
Glucose-Capillary: 232 mg/dL — ABNORMAL HIGH (ref 65–99)
Glucose-Capillary: 146 mg/dL — ABNORMAL HIGH (ref 65–99)

## 2016-01-13 LAB — ECHOCARDIOGRAM COMPLETE
HEIGHTINCHES: 69 in
Weight: 3462.4 oz

## 2016-01-13 LAB — I-STAT TROPONIN, ED: TROPONIN I, POC: 0 ng/mL (ref 0.00–0.08)

## 2016-01-13 LAB — HEPARIN LEVEL (UNFRACTIONATED): HEPARIN UNFRACTIONATED: 0.28 [IU]/mL — AB (ref 0.30–0.70)

## 2016-01-13 LAB — TROPONIN I
Troponin I: <0.03 ng/mL (ref <0.031)
Troponin I: <0.03 ng/mL (ref <0.031)

## 2016-01-13 LAB — PROTIME-INR
INR: 1.92 — ABNORMAL HIGH (ref 0.00–1.49)
Prothrombin Time: 21.9 seconds — ABNORMAL HIGH (ref 11.6–15.2)

## 2016-01-13 MED ORDER — SODIUM CHLORIDE 0.9 % IV SOLN: INTRAVENOUS | Status: DC

## 2016-01-13 MED ORDER — IOPAMIDOL (ISOVUE-300) INJECTION 61%
INTRAVENOUS | Status: AC
  Administered 2016-01-13: 100 mL
  Filled 2016-01-13: qty 100

## 2016-01-13 MED ORDER — ACETAMINOPHEN 325 MG PO TABS
650.0000 mg | ORAL_TABLET | ORAL | Status: DC | PRN
  Administered 2016-01-13: 650 mg via ORAL
  Filled 2016-01-13: qty 2

## 2016-01-13 MED ORDER — POTASSIUM CHLORIDE CRYS ER 20 MEQ PO TBCR
20.0000 meq | EXTENDED_RELEASE_TABLET | Freq: Every day | ORAL | Status: DC
  Administered 2016-01-13: 20 meq via ORAL
  Filled 2016-01-13: qty 1

## 2016-01-13 MED ORDER — HYDRALAZINE HCL 50 MG PO TABS
50.0000 mg | ORAL_TABLET | Freq: Three times a day (TID) | ORAL | Status: DC
  Administered 2016-01-13: 50 mg via ORAL
  Filled 2016-01-13 (×2): qty 1

## 2016-01-13 MED ORDER — INSULIN GLARGINE 100 UNIT/ML ~~LOC~~ SOLN
35.0000 [IU] | Freq: Every day | SUBCUTANEOUS | Status: DC
  Administered 2016-01-13: 35 [IU] via SUBCUTANEOUS
  Filled 2016-01-13 (×2): qty 0.35

## 2016-01-13 MED ORDER — OXYCODONE-ACETAMINOPHEN 5-325 MG PO TABS: 1.0000 | ORAL_TABLET | ORAL | Status: DC | PRN

## 2016-01-13 MED ORDER — INSULIN ASPART 100 UNIT/ML ~~LOC~~ SOLN
0.0000 [IU] | Freq: Three times a day (TID) | SUBCUTANEOUS | Status: DC
Start: 2016-01-13 — End: 2016-01-13
  Administered 2016-01-13: 3 [IU] via SUBCUTANEOUS
  Administered 2016-01-13: 1 [IU] via SUBCUTANEOUS

## 2016-01-13 MED ORDER — ATORVASTATIN CALCIUM 40 MG PO TABS: 40.0000 mg | ORAL_TABLET | Freq: Every day | ORAL | Status: DC

## 2016-01-13 MED ORDER — ALPRAZOLAM 0.25 MG PO TABS: 0.2500 mg | ORAL_TABLET | Freq: Two times a day (BID) | ORAL | Status: DC | PRN

## 2016-01-13 MED ORDER — ATORVASTATIN CALCIUM 80 MG PO TABS: 80.0000 mg | ORAL_TABLET | Freq: Every day | ORAL | Status: DC

## 2016-01-13 MED ORDER — MORPHINE SULFATE (PF) 2 MG/ML IV SOLN
2.0000 mg | INTRAVENOUS | Status: DC | PRN
  Filled 2016-01-13: qty 1

## 2016-01-13 MED ORDER — INSULIN GLARGINE 100 UNIT/ML ~~LOC~~ SOLN
25.0000 [IU] | Freq: Every day | SUBCUTANEOUS | Status: DC
  Filled 2016-01-13: qty 0.25

## 2016-01-13 MED ORDER — ASPIRIN 325 MG PO TABS
325.0000 mg | ORAL_TABLET | Freq: Once | ORAL | Status: AC
  Administered 2016-01-13: 325 mg via ORAL
  Filled 2016-01-13: qty 1

## 2016-01-13 MED ORDER — ASPIRIN EC 81 MG PO TBEC: 81.0000 mg | DELAYED_RELEASE_TABLET | Freq: Every day | ORAL | Status: DC

## 2016-01-13 MED ORDER — AMLODIPINE BESYLATE 5 MG PO TABS
5.0000 mg | ORAL_TABLET | Freq: Every day | ORAL | Status: DC
  Administered 2016-01-13: 5 mg via ORAL
  Filled 2016-01-13: qty 1

## 2016-01-13 MED ORDER — FUROSEMIDE 40 MG PO TABS
40.0000 mg | ORAL_TABLET | Freq: Every day | ORAL | Status: DC
  Administered 2016-01-13: 40 mg via ORAL
  Filled 2016-01-13: qty 1

## 2016-01-13 MED ORDER — CARVEDILOL 12.5 MG PO TABS
12.5000 mg | ORAL_TABLET | Freq: Two times a day (BID) | ORAL | Status: DC
  Administered 2016-01-13 (×2): 12.5 mg via ORAL
  Filled 2016-01-13 (×2): qty 1

## 2016-01-13 MED ORDER — ONDANSETRON HCL 4 MG/2ML IJ SOLN: 4.0000 mg | Freq: Four times a day (QID) | INTRAMUSCULAR | Status: DC | PRN

## 2016-01-13 MED ORDER — GI COCKTAIL ~~LOC~~: 30.0000 mL | Freq: Four times a day (QID) | ORAL | Status: DC | PRN

## 2016-01-13 MED ORDER — SODIUM CHLORIDE 0.9 % IV BOLUS (SEPSIS)
1000.0000 mL | Freq: Once | INTRAVENOUS | Status: AC
  Administered 2016-01-13: 1000 mL via INTRAVENOUS

## 2016-01-13 MED ORDER — ATORVASTATIN CALCIUM 80 MG PO TABS
80.0000 mg | ORAL_TABLET | Freq: Every day | ORAL | Status: DC
  Administered 2016-01-13: 80 mg via ORAL
  Filled 2016-01-13: qty 1

## 2016-01-13 MED ORDER — FUROSEMIDE 20 MG PO TABS
20.0000 mg | ORAL_TABLET | Freq: Every day | ORAL | Status: DC
  Administered 2016-01-13: 20 mg via ORAL
  Filled 2016-01-13: qty 1

## 2016-01-13 MED ORDER — CLOPIDOGREL BISULFATE 75 MG PO TABS
75.0000 mg | ORAL_TABLET | Freq: Every day | ORAL | Status: DC
  Administered 2016-01-13: 75 mg via ORAL
  Filled 2016-01-13: qty 1

## 2016-01-13 MED ORDER — SPIRONOLACTONE 25 MG PO TABS
25.0000 mg | ORAL_TABLET | Freq: Every day | ORAL | Status: DC
  Administered 2016-01-13: 25 mg via ORAL
  Filled 2016-01-13: qty 1

## 2016-01-13 MED ORDER — NITROGLYCERIN 0.4 MG SL SUBL
0.4000 mg | SUBLINGUAL_TABLET | SUBLINGUAL | Status: DC | PRN
  Administered 2016-01-13: 0.4 mg via SUBLINGUAL
  Filled 2016-01-13: qty 1

## 2016-01-13 MED ORDER — POTASSIUM CHLORIDE CRYS ER 20 MEQ PO TBCR: 40.0000 meq | EXTENDED_RELEASE_TABLET | Freq: Every day | ORAL | Status: DC

## 2016-01-13 MED ORDER — FENTANYL CITRATE (PF) 100 MCG/2ML IJ SOLN
100.0000 ug | Freq: Once | INTRAMUSCULAR | Status: AC
  Administered 2016-01-13: 100 ug via INTRAVENOUS
  Filled 2016-01-13: qty 2

## 2016-01-13 MED ORDER — HEPARIN (PORCINE) IN NACL 100-0.45 UNIT/ML-% IJ SOLN
1450.0000 [IU]/h | INTRAMUSCULAR | Status: DC
  Administered 2016-01-13: 1400 [IU]/h via INTRAVENOUS
  Filled 2016-01-13: qty 250

## 2016-01-13 NOTE — Progress Notes (Signed)
ANTICOAGULATION CONSULT NOTE - Initial Consult  Pharmacy Consult for heparin Indication: Aflutter and r/o ACS  Allergies  Allergen Reactions  . Ace Inhibitors Cough  . Other Hives and Other (See Comments)    Patient reports developing hives after receiving "some antibiotic given in 1980''s at [facility]". He does not know which antibiotic.    Patient Measurements: Height: 6' (182.9 cm) Weight: 217 lb (98.431 kg) IBW/kg (Calculated) : 77.6  Vital Signs: Temp: 98.7 F (37.1 C) (06/02 0131) Temp Source: Oral (06/02 0131) BP: 124/83 mmHg (06/02 0515) Pulse Rate: 83 (06/02 0515)  Labs:  Recent Labs  01/13/16 0141  HGB 13.5  HCT 42.0  PLT 171  LABPROT 21.9*  INR 1.92*  CREATININE 1.52*    Estimated Creatinine Clearance: 62 mL/min (by C-G formula based on Cr of 1.52).   Medical History: Past Medical History  Diagnosis Date  . Hypertension   . Gout   . PAF (paroxysmal atrial fibrillation) (HCC)     On coumadin  . LIVER FUNCTION TESTS, ABNORMAL, HX OF 12/30/2006  . Rectal bleeding 12/18/2011    Scheduled for colonoscopy.    [name] HEPATITIS B, CHRONIC 12/30/2006  . HYPERCHOLESTEROLEMIA 07/11/2010  . MITRAL REGURGITATION 12/30/2006  . COLONIC POLYPS, HX OF 12/30/2006  . CAD (coronary artery disease), native coronary artery     a. Nonobstructive CAD by cath 2013 - diffuse distal and branch vessel CAD, no severe disease in the major coronaries, LV mild global hypokinesis, EF 45%. b. ETT-Sestamibi 5/14: EF 31%, small fixed inferior defect with no ischemia.  . Chronic CHF ([place])     a. Mixed ICM/NICM (?EtOH). EF 35% in 2008. Echo 5/13: EF 60-65%, mod LVH, EF 45% on V gram in 12/2011. EF 12/2012: EF 50-55%, mild LVH, inferobasal HK, mild MR. ETT-Ses 5/14 EF 41%. Cardiac MRI 5/14: EF 44%, mild global HK, subepicardial delayed enhancement in nonspecific RV insertion pattern.  . H/O atrial flutter 2007    a. Ablations in 2007, 2008.  [name] History of medication noncompliance   .  History of alcohol abuse   . 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 Heart murmur   . Anginal pain ([place])   . Sleep apnea     "suppose to send mask but they never did" (05/03/2015)  . Type II diabetes mellitus ([place])   . Left sciatic nerve pain since 04/2015  . Anxiety   . Cholecystitis   . Arrhythmia   . History of nuclear stress test     Myoview 3/17: + chest pain; EF 33%, downsloping ST depression 2, V4-6, LVH with strain, no ischemia on images; intermediate risk      Assessment: 60yo male c/o right-sided CP, found to have ST depression, concern for ACS, to begin heparin; of note pt is on Coumadin PTA for Aflutter, INR currently just below goal, to hold Coumadin in case procedure required.  Goal of Therapy:  Heparin level 0.3-0.7 units/ml Monitor platelets by anticoagulation protocol: Yes   Plan:  Will begin heparin gtt at 1400 units/hr and monitor heparin levels and CBC.  Wynona Neat, PharmD, BCPS  01/13/2016,5:31 AM

## 2016-01-13 NOTE — Progress Notes (Signed)
.  This is a no charge note   Pending admission per Dr. Claudine Mouton  60 yo men with PMH of HTN, PAF on coumadin, CAD, s/p stents on 12/2011, dCHF, HLD, DM, cardiac catheterrization on 04/2015 and 10/2015, cardioversion 08/2015, and biliary draining tube placed on 05/2015, who presents with right-sided chest pain. Pt has new ST depression in V5-V6 on EKG. CT-abd/pelvis without new acute issues. CT-chest is not impressive. INR=1.92, less likely to have PE.  Of note, pt has an appoint to see his cardiologist on 01/20/2016 to obtain cardiac clearance to have the drain removed. Pt is accepted to tele bed for obs. I switched coumadin to IV Heparin.  Ivor Costa, MD  [facility] Pager [phone]  If 7PM-7AM, please contact night-coverage [email or web address] Password [facility] 01/13/2016, 5:39 AM

## 2016-01-13 NOTE — Discharge Summary (Signed)
Physician Discharge Summary  Firman Vanderheide [id number] DOB: 1956-05-10 DOA: 01/13/2016  PCP: Angelica Chessman, MD  Admit date: 01/13/2016 Discharge date: 01/13/2016   Recommendations for Outpatient Follow-up:  1. Patient to follow with PCP.  2. When ready for bilary drain removal, to have Cards Clearance as OP.     Discharge Diagnoses:  Principal Problem:   Chest pain Active Problems:   HYPERCHOLESTEROLEMIA   Chronic diastolic CHF (congestive heart failure) (HCC)   Essential hypertension   Cholelithiases- drain in place   Diastolic dysfunction-grade 2 Sept 2016   Diabetes [facility])   Coronary artery disease involving native coronary artery of native heart with angina pectoris [facility])   Discharge Condition: stable  Diet recommendation: heart healthy/ diabetic diet.  Filed Weights   01/13/16 0131 01/13/16 0700  Weight: 98.431 kg (217 lb) 98.158 kg (216 lb 6.4 oz)    History of present illness:  60 y.o. male with medical history significant for HTN, HLD DM, PAF on Coumadin, CAD, h/o cholelithiasis/cholecystitis with biliary drain placed in October 2016, h/o Osteosarcoma presenting to the emergency department with constant, unchanged, right-sided chest pain, worse with movement and deep inspiration, non reproducible, present since last night. He denies any shortness of breath, vomiting diaphoresis, shoulder pain, recent injury or heavy lifting. Denies fevers, chills, night sweats, vision changes, or mucositis. Denies any respiratory complaints. Denies lower extremity swelling. Denies nausea, heartburn or change in bowel habits. Denies abdominal pain. Appetite is normal. Denies any dysuria. Denies abnormal skin rashes, or neuropathy. Denies any bleeding issues such as epistaxis, hematemesis, hematuria or hematochezia. Ambulating without difficulty. The patient had already 2 cardiac stents in 2013, and a cardiac catheterization in 2016, and most recently on March 2017. He also has a  cardioversion history in January 2017. He was to have a cardiac clearance on June 9 for removal of his biliary stent.  Hospital Course:   Cards evaluation was obtained  Due to multitude of cardiac issues.    Chest Pain Syndrome:   New ST depression in V5-V6 on EKG. Tn 0.03 CT chest non impressive, no PE. INR 1. 92 CP unrelieved by nitroglycerin, morphine . Afebrile His EKG showed some ST flattening in V4 through V6. This was present in EKG from March of this year. Recent cath in March of this year showed extensive CAD but no critical lesions. Serial  troponin is negative 1. He iwas on Plavix and Coumadin at home. Reports taking his medications with high compliance. No need for further ischemic evaluation.  Paroxysmal Atrial Fibrillation, ChadsVasc5 s/p Cardioversion in Jan 2016, as well as Atrial flutter ablations in jan 2016, 2007 and 2008. On Coumadin at home, here was placed on Heparin drip.  Patient is now on NSR.   Chronic diastolic heart failure. 2 D echo on 6/2  read by Cards without significant changes from prior in 2016  Continue Coreg, Lasix, Spironolactone  Hypertension BP 119/68 mmHg  Pulse 80  Temp(Src) 98.1 F (36.7 C) (Oral)  Resp 16  Ht 5\' 9"  (1.753 m)  Wt 98.158 kg (216 lb 6.4 oz)  BMI 31.94 kg/m2  SpO2 98% Continue amlodipine, hydralazine and Beta Blocker   Hyperlipidemia Continue home statins   Diabetes type 2 Ins Dependent BS on admission 181 Placed on lower dose LAntus at 35 U (50 at home), and SSI No issues regarding his glucose levels arose during hospitalization  Cholelithiasis. S/p Percutaneous cholecystectomy tune in place, I CT abdomen ntraluminal gallstones including a gallstone at the gallbladder neck. Mild  gallbladder wall thickening may be due to nondistention, no pericholecystic edema  Per Cards report: " His clopidogrel can be temporarily interrupted for surgery (5-7 days before surgery). Similarly, warfarin can be temporarily interrupted for  biliary surgery (5-7 days before surgery). Heparin "bridging" is probably not indicated. When a date for surgery is scheduled, please let us know so that we can coordinate the drug changes"  A prescription for Oxycodone has been given to the patient, as mentioned in med reconciliation  AKI BL Cr 1.0  Received gentle hydration prior to discharge  He will be followed up by PCP as outpatient  Procedures: 2 D echo 6/2  The cavity size was normal. There was severe  focal basal and moderate concentric hypertrophy. Systolic function was normal. The estimated ejection fraction was in the range of 55% to 60%. Wall motion was normal; there were no regional wall motion abnormalities. Features are consistent with a pseudonormal left ventricular filling pattern, with concomitant abnormal relaxation and increased filling pressure (grade 2 diastolic dysfunction). - Aortic valve: Trileaflet; mildly thickened, mildly calcified leaflets. There was mild regurgitation.- Mitral valve: Mild focal calcification of the anterior leaflet (medial segment(s)). There was mild regurgitation.- Left atrium: The atrium was mildly dilated.  CT A/P 01/13/16  1. Percutaneous cholecystostomy tube in place. Intraluminal gallstones including a gallstone at the gallbladder neck. Mild gallbladder wall thickening may be due to nondistention, no pericholecystic edema.  CT chest 01/13/16  neg masses or other acute abnormalities.  Consultations:  Cardiology, Dr. Radford Pax.   Discharge Exam: Filed Vitals:   01/13/16 0952 01/13/16 1143  BP: 142/90 119/68  Pulse:  80  Temp:  98.1 F (36.7 C)  Resp:     General: Alert, awake, comfortable  Eyes: PERRL, lids and conjunctivae normal ENMT: Mucous membranes are moist. Posterior pharynx clear of any exudate or lesions.Normal dentition.  Neck: normal, supple, no masses, no thyromegaly Respiratory: clear to auscultation bilaterally, no wheezing, no crackles. Normal respiratory effort.  No accessory muscle use.  Cardiovascular: Irregularly regular rate and rhythm, no murmurs / rubs / gallops. No extremity edema. 2+ pedal pulses. No carotid bruits.  Abdomen: no tenderness, no masses palpated. No hepatosplenomegaly. Bowel sounds positive. Biliary drain RUQ without drainage  Musculoskeletal: no clubbing / cyanosis. No joint deformity upper and lower extremities. Good ROM, no contractures. Normal muscle tone.  Skin: no rashes, lesions, ulcers. No induration Neurologic: CN 2-12 grossly intact. Sensation intact, DTR normal. Strength 5/5 in all 4.  Psychiatric: Normal judgment and insight. Alert and oriented x 3. Normal mood.   Discharge Instructions   Discharge Instructions    Diet - low sodium heart healthy    Complete by:  As directed      Discharge instructions    Complete by:  As directed   INR check on Monday 6/5.     Increase activity slowly    Complete by:  As directed           Current Discharge Medication List    START taking these medications   Details  oxyCODONE-acetaminophen (ROXICET) 5-325 MG tablet Take 1 tablet by mouth every 4 (four) hours as needed for severe pain. Qty: 30 tablet, Refills: 0      CONTINUE these medications which have CHANGED   Details  atorvastatin (LIPITOR) 80 MG tablet Take 1 tablet (80 mg total) by mouth daily. Qty: 30 tablet, Refills: 1   Associated Diagnoses: Coronary artery disease involving native coronary artery of native heart with angina pectoris ([place])  CONTINUE these medications which have NOT CHANGED   Details  acetaminophen (TYLENOL) 500 MG tablet Take 1,000 mg by mouth every 6 (six) hours as needed (pain). Reported on 08/18/2015    amLODipine (NORVASC) 5 MG tablet Take 1 tablet (5 mg total) by mouth daily. Qty: 90 tablet, Refills: 3   Associated Diagnoses: Essential hypertension    carvedilol (COREG) 12.5 MG tablet Take 1 tablet (12.5 mg total) by mouth 2 (two) times daily with a meal. Qty: 180 tablet,  Refills: 3   Associated Diagnoses: Coronary artery disease involving native coronary artery of native heart with angina pectoris (HCC)    clopidogrel (PLAVIX) 75 MG tablet Take 1 tablet (75 mg total) by mouth daily with breakfast. Qty: 90 tablet, Refills: 3   Associated Diagnoses: Coronary artery disease involving native coronary artery of native heart with angina pectoris (HCC)    furosemide (LASIX) 40 MG tablet TAKE ONE TABLET BY MOUTH IN THE MORNING AND ONE-HALF TABLET IN THE EVENING Qty: 90 tablet, Refills: 3   Associated Diagnoses: Coronary artery disease involving native coronary artery of native heart with angina pectoris (HCC)    hydrALAZINE (APRESOLINE) 50 MG tablet Take 1 tablet (50 mg total) by mouth 3 (three) times daily. Qty: 270 tablet, Refills: 3   Associated Diagnoses: Essential hypertension    Insulin Glargine (LANTUS SOLOSTAR) 100 UNIT/ML Solostar Pen Inject 50 Units into the skin every morning. And pen needles 1/day Qty: 30 mL, Refills: 3   Associated Diagnoses: Type 2 diabetes mellitus with diabetic peripheral angiopathy without gangrene, with long-term current use of insulin (HCC)    metFORMIN (GLUCOPHAGE) 1000 MG tablet Take 1 tablet (1,000 mg total) by mouth 2 (two) times daily with a meal. Qty: 180 tablet, Refills: 3   Associated Diagnoses: Type 2 diabetes mellitus with diabetic peripheral angiopathy without gangrene, with long-term current use of insulin (HCC)    nitroGLYCERIN (NITROSTAT) 0.4 MG SL tablet Place 0.4 mg under the tongue every 5 (five) minutes as needed for chest pain (x 3 tabs daily). Reported on 08/18/2015    potassium chloride SA (K-DUR,KLOR-CON) 20 MEQ tablet Take 1 tablet (20 mEq total) by mouth daily. Qty: 90 tablet, Refills: 3   Associated Diagnoses: Essential hypertension    spironolactone (ALDACTONE) 25 MG tablet Take 1 tablet (25 mg total) by mouth daily. Qty: 90 tablet, Refills: 3   Associated Diagnoses: Coronary artery disease involving  native coronary artery of native heart with angina pectoris (HCC)    warfarin (COUMADIN) 5 MG tablet Take as directed by coumadin clinic Qty: 90 tablet, Refills: 1   Associated Diagnoses: Essential hypertension       Allergies  Allergen Reactions  . Ace Inhibitors Cough  . Other Hives and Other (See Comments)    Patient reports developing hives after receiving "some antibiotic given in 1980''s at [facility]". He does not know which antibiotic.      The results of significant diagnostics from this hospitalization (including imaging, microbiology, ancillary and laboratory) are listed below for reference.    Significant Diagnostic Studies: Dg Chest 2 View  01/13/2016  CLINICAL DATA:  Acute onset of right-sided chest pain. Mild shortness of breath. Initial encounter. EXAM: CHEST  2 VIEW COMPARISON:  Chest radiograph from 08/22/2015 FINDINGS: The lungs are well-aerated. Vascular congestion is noted. Peribronchial thickening is seen. Mild left basilar atelectasis is noted. There is no evidence of pleural effusion or pneumothorax. The heart is borderline normal in size. No acute osseous abnormalities are seen.  IMPRESSION: Vascular congestion noted. Peribronchial thickening seen. Mild left basilar atelectasis noted. Electronically Signed   By: Garald Balding M.D.   On: 01/13/2016 02:35   Ct Chest W Contrast  01/13/2016  CLINICAL DATA:  Chest pain. Right upper quadrant pain. Biliary drain in place for 6 months. EXAM: CT CHEST, ABDOMEN, AND PELVIS WITH CONTRAST TECHNIQUE: Multidetector CT imaging of the chest, abdomen and pelvis was performed following the standard protocol during bolus administration of intravenous contrast. CONTRAST:  122mL ISOVUE-300 IOPAMIDOL (ISOVUE-300) INJECTION 61% COMPARISON:  CT abdomen 07/19/2015 FINDINGS: CT CHEST FINDINGS Mediastinum/Lymph Nodes: Mild atherosclerosis of thoracic aorta. Coronary artery calcifications versus stents. No hilar or mediastinal  adenopathy. Small mediastinal lymph nodes not enlarged by size criteria, for example lower paratracheal node measures 7 mm. No pericardial effusion. Lungs/Pleura: Minimal scattered atelectasis. No consolidation to suggest pneumonia. Minimal smooth septal thickening. No pulmonary mass or suspicious nodule. No pleural effusion. Musculoskeletal: There are no acute or suspicious osseous abnormalities. Degenerative disc disease in the thoracic spine. CT ABDOMEN PELVIS FINDINGS Hepatobiliary: Percutaneous cholecystostomy tube in place. Gallbladder is decompressed. Gallstones are noted including a gallstone at the neck. Gallbladder wall thickening measuring 6 mm. No pericholecystic inflammation. No biliary dilatation. Small hyper attenuating lesion in segment 3, previous MRI characterization consistent with hemangioma. Mild steatosis. No intrahepatic collection or new focal lesion. Pancreas: No ductal dilatation or inflammation. Spleen: Normal in size. Adrenals/Urinary Tract: No adrenal nodule. No hydronephrosis. Cysts in the upper left kidney, previously characterized by MRI in April 2015 as Bosniak 2 cyst. No new renal lesion. No perinephric edema. Symmetric enhancement and excretion on delayed phase imaging. Bladder minimally distended. No bladder wall thickening. Stomach/Bowel: Small hiatal hernia. Stomach is decompressed. No duodenum wall thickening. There are no dilated or thickened bowel loops. Minimal colonic diverticulosis without diverticulitis. Small to moderate stool burden. The appendix is normal. Vascular/Lymphatic: Atherosclerosis and tortuosity of normal caliber thoracic aorta. No retroperitoneal adenopathy. IVC is patent. Reproductive: Prominent sized prostate gland. Other: No free air, or free fluid or intra-abdominal fluid collection. Fat within both inguinal canals. Musculoskeletal: Bilateral L5 pars interarticularis defects with grade 1 anterolisthesis. Degenerative disc disease at L3-L4. There are no  acute or suspicious osseous abnormalities. Probable prior musculotendinous injury at the pubic symphyseal insertion. IMPRESSION: 1. Percutaneous cholecystostomy tube in place. Intraluminal gallstones including a gallstone at the gallbladder neck. Mild gallbladder wall thickening may be due to nondistention, no pericholecystic edema. 2. No additional acute abnormality in the chest, abdomen, or pelvis. 3. Additional chronic findings are unchanged. Electronically Signed   By: Jeb Levering M.D.   On: 01/13/2016 04:46   Ct Abdomen Pelvis W Contrast  01/13/2016  CLINICAL DATA:  Chest pain. Right upper quadrant pain. Biliary drain in place for 6 months. EXAM: CT CHEST, ABDOMEN, AND PELVIS WITH CONTRAST TECHNIQUE: Multidetector CT imaging of the chest, abdomen and pelvis was performed following the standard protocol during bolus administration of intravenous contrast. CONTRAST:  141mL ISOVUE-300 IOPAMIDOL (ISOVUE-300) INJECTION 61% COMPARISON:  CT abdomen 07/19/2015 FINDINGS: CT CHEST FINDINGS Mediastinum/Lymph Nodes: Mild atherosclerosis of thoracic aorta. Coronary artery calcifications versus stents. No hilar or mediastinal adenopathy. Small mediastinal lymph nodes not enlarged by size criteria, for example lower paratracheal node measures 7 mm. No pericardial effusion. Lungs/Pleura: Minimal scattered atelectasis. No consolidation to suggest pneumonia. Minimal smooth septal thickening. No pulmonary mass or suspicious nodule. No pleural effusion. Musculoskeletal: There are no acute or suspicious osseous abnormalities. Degenerative disc disease in the thoracic spine. CT ABDOMEN PELVIS FINDINGS Hepatobiliary:  Percutaneous cholecystostomy tube in place. Gallbladder is decompressed. Gallstones are noted including a gallstone at the neck. Gallbladder wall thickening measuring 6 mm. No pericholecystic inflammation. No biliary dilatation. Small hyper attenuating lesion in segment 3, previous MRI characterization consistent  with hemangioma. Mild steatosis. No intrahepatic collection or new focal lesion. Pancreas: No ductal dilatation or inflammation. Spleen: Normal in size. Adrenals/Urinary Tract: No adrenal nodule. No hydronephrosis. Cysts in the upper left kidney, previously characterized by MRI in April 2015 as Bosniak 2 cyst. No new renal lesion. No perinephric edema. Symmetric enhancement and excretion on delayed phase imaging. Bladder minimally distended. No bladder wall thickening. Stomach/Bowel: Small hiatal hernia. Stomach is decompressed. No duodenum wall thickening. There are no dilated or thickened bowel loops. Minimal colonic diverticulosis without diverticulitis. Small to moderate stool burden. The appendix is normal. Vascular/Lymphatic: Atherosclerosis and tortuosity of normal caliber thoracic aorta. No retroperitoneal adenopathy. IVC is patent. Reproductive: Prominent sized prostate gland. Other: No free air, or free fluid or intra-abdominal fluid collection. Fat within both inguinal canals. Musculoskeletal: Bilateral L5 pars interarticularis defects with grade 1 anterolisthesis. Degenerative disc disease at L3-L4. There are no acute or suspicious osseous abnormalities. Probable prior musculotendinous injury at the pubic symphyseal insertion. IMPRESSION: 1. Percutaneous cholecystostomy tube in place. Intraluminal gallstones including a gallstone at the gallbladder neck. Mild gallbladder wall thickening may be due to nondistention, no pericholecystic edema. 2. No additional acute abnormality in the chest, abdomen, or pelvis. 3. Additional chronic findings are unchanged. Electronically Signed   By: Jeb Levering M.D.   On: 01/13/2016 04:46    Microbiology: No results found for this or any previous visit (from the past 240 hour(s)).   Labs: Basic Metabolic Panel:  Recent Labs Lab 01/13/16 0141  NA 135  K 3.4*  CL 103  CO2 22  GLUCOSE 125*  BUN 19  CREATININE 1.52*  CALCIUM 9.4   Liver Function  Tests: No results for input(s): AST, ALT, ALKPHOS, BILITOT, PROT, ALBUMIN in the last 168 hours. No results for input(s): LIPASE, AMYLASE in the last 168 hours. No results for input(s): AMMONIA in the last 168 hours. CBC:  Recent Labs Lab 01/13/16 0141  WBC 7.5  HGB 13.5  HCT 42.0  MCV 84.0  PLT 171   Cardiac Enzymes:  Recent Labs Lab 01/13/16 0602 01/13/16 1113  TROPONINI <0.03 <0.03   BNP: BNP (last 3 results)  Recent Labs  08/22/15 1609 10/12/15 1039  BNP 331.0* 45.5    ProBNP (last 3 results)  Recent Labs  04/22/15 1559  PROBNP 59.0    CBG:  Recent Labs Lab 01/11/16 1144 01/13/16 0812 01/13/16 1139 01/13/16 1629  GLUCAP 253* 129* 232* 146*       Signed:  Sharene Butters E PA-C [facility] 01/13/2016, 4:54 PM

## 2016-01-13 NOTE — Progress Notes (Signed)
  Echocardiogram 2D Echocardiogram has been performed.  Darlina Sicilian M 01/13/2016, 1:51 PM

## 2016-01-13 NOTE — Progress Notes (Signed)
ANTICOAGULATION CONSULT NOTE - Heparin  Consult  Pharmacy Consult for heparin Indication: Aflutter and r/o ACS  Allergies  Allergen Reactions  . Ace Inhibitors Cough  . Other Hives and Other (See Comments)    Patient reports developing hives after receiving "some antibiotic given in 1980''s at [facility]". He does not know which antibiotic.    Patient Measurements: Height: 5\' 9"  (175.3 cm) Weight: 216 lb 6.4 oz (98.158 kg) IBW/kg (Calculated) : 70.7  Vital Signs: Temp: 98.1 F (36.7 C) (06/02 1143) Temp Source: Oral (06/02 1143) BP: 119/68 mmHg (06/02 1143) Pulse Rate: 80 (06/02 1143)  Labs:  Recent Labs  01/13/16 0141 01/13/16 0602 01/13/16 1113  HGB 13.5  --   --   HCT 42.0  --   --   PLT 171  --   --   LABPROT 21.9*  --   --   INR 1.92*  --   --   HEPARINUNFRC  --   --  0.28*  CREATININE 1.52*  --   --   TROPONINI  --  <0.03 <0.03    Estimated Creatinine Clearance: 59 mL/min (by C-G formula based on Cr of 1.52).   Medical History: Past Medical History  Diagnosis Date  . Hypertension   . Gout   . PAF (paroxysmal atrial fibrillation) (HCC)     On coumadin  . LIVER FUNCTION TESTS, ABNORMAL, HX OF 12/30/2006  . Rectal bleeding 12/18/2011    Scheduled for colonoscopy.    [name] HEPATITIS B, CHRONIC 12/30/2006  . HYPERCHOLESTEROLEMIA 07/11/2010  . MITRAL REGURGITATION 12/30/2006  . COLONIC POLYPS, HX OF 12/30/2006  . CAD (coronary artery disease), native coronary artery     a. Nonobstructive CAD by cath 2013 - diffuse distal and branch vessel CAD, no severe disease in the major coronaries, LV mild global hypokinesis, EF 45%. b. ETT-Sestamibi 5/14: EF 31%, small fixed inferior defect with no ischemia.  . Chronic CHF ([place])     a. Mixed ICM/NICM (?EtOH). EF 35% in 2008. Echo 5/13: EF 60-65%, mod LVH, EF 45% on V gram in 12/2011. EF 12/2012: EF 50-55%, mild LVH, inferobasal HK, mild MR. ETT-Ses 5/14 EF 41%. Cardiac MRI 5/14: EF 44%, mild global HK, subepicardial  delayed enhancement in nonspecific RV insertion pattern.  . H/O atrial flutter 2007    a. Ablations in 2007, 2008.  [name] History of medication noncompliance   . History of alcohol abuse   . 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 Heart murmur   . Anginal pain ([place])   . Sleep apnea     "suppose to send mask but they never did" (05/03/2015)  . Type II diabetes mellitus ([place])   . Left sciatic nerve pain since 04/2015  . Anxiety   . Cholecystitis   . Arrhythmia   . History of nuclear stress test     Myoview 3/17: + chest pain; EF 33%, downsloping ST depression 2, V4-6, LVH with strain, no ischemia on images; intermediate risk      Assessment: 60yo male c/o right-sided CP, found to have ST depression, concern for ACS, to begin heparin; of note pt is on Coumadin PTA for Aflutter, INR currently just below goal, to hold Coumadin in case procedure required. Heparin drip 1400 uts/hr HL 0.28 slightly < goal h/h ok  Goal of Therapy:  Heparin level 0.3-0.7 units/ml Monitor platelets by anticoagulation protocol: Yes   Plan:  Will increase  heparin gtt at 1450 units/hr and monitor heparin  levels and CBC.  Bonnita Nasuti Pharm.D. CPP, BCPS Clinical Pharmacist [phone] 01/13/2016 2:13 PM

## 2016-01-13 NOTE — ED Provider Notes (Signed)
CSN: [id number]     Arrival date & time 01/13/16  0127 History  By signing my name below, I, Meriel Flavors, attest that this documentation has been prepared under the direction and in the presence of Everlene Balls, MD. Electronically signed, Meriel Flavors, ED Scribe. 01/13/2016. 3:25 AM.    Chief Complaint  Patient presents with  . Chest Pain   The history is provided by the patient and a relative. No language interpreter was used.   HPI Comments: [name] [name] is a 59 y.o. male with a PMHx of HTN, PAF, and CAD, who presents to the Emergency Department complaining of constant, unchanged right sided chest pain that began yesterday. Pt reports pain is made worse with movement. Pt denies shortness of breath, vomiting, diaphoresis, shoulder pain, recent injury or heavy lifting. Pt has a PSHx of 2 cardiac stents on 12/2011, cardiac catheterrization on 04/2015; 10/2015, cardioversion 08/2015, and a biliary drain placed on 05/2015.  Pt has an appoint to see his cardiologist on 01/20/2016 to obtain cardiac clearance to have the drain removed.  Past Medical History  Diagnosis Date  . Hypertension   . Gout   . PAF (paroxysmal atrial fibrillation) (HCC)     On coumadin  . LIVER FUNCTION TESTS, ABNORMAL, HX OF 12/30/2006  . Rectal bleeding 12/18/2011    Scheduled for colonoscopy.    [name] HEPATITIS B, CHRONIC 12/30/2006  . HYPERCHOLESTEROLEMIA 07/11/2010  . MITRAL REGURGITATION 12/30/2006  . COLONIC POLYPS, HX OF 12/30/2006  . CAD (coronary artery disease), native coronary artery     a. Nonobstructive CAD by cath 2013 - diffuse distal and branch vessel CAD, no severe disease in the major coronaries, LV mild global hypokinesis, EF 45%. b. ETT-Sestamibi 5/14: EF 31%, small fixed inferior defect with no ischemia.  . Chronic CHF ([place])     a. Mixed ICM/NICM (?EtOH). EF 35% in 2008. Echo 5/13: EF 60-65%, mod LVH, EF 45% on V gram in 12/2011. EF 12/2012: EF 50-55%, mild LVH, inferobasal HK, mild MR. ETT-Ses 5/14 EF 41%. Cardiac MRI  5/14: EF 44%, mild global HK, subepicardial delayed enhancement in nonspecific RV insertion pattern.  . H/O atrial flutter 2007    a. Ablations in 2007, 2008.  [name] History of medication noncompliance   . History of alcohol abuse   . 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 Heart murmur   . Anginal pain ([place])   . Sleep apnea     "suppose to send mask but they never did" (05/03/2015)  . Type II diabetes mellitus ([place])   . Left sciatic nerve pain since 04/2015  . Anxiety   . Cholecystitis   . Arrhythmia   . History of nuclear stress test     Myoview 3/17: + chest pain; EF 33%, downsloping ST depression 2, V4-6, LVH with strain, no ischemia on images; intermediate risk    Past Surgical History  Procedure Laterality Date  . Osteochondroma excision Left 1972    "took bone tumor off my shoulder"  . Coronary angioplasty  12/05/01  . A flutter ablation  2007, 2008    catheter ablation   . Left heart catheterization with coronary angiogram N/A 12/19/2011    Procedure: LEFT HEART CATHETERIZATION WITH CORONARY ANGIOGRAM;  Surgeon: Larey Dresser, MD;  Location[facility] CATH LAB;  Service: Cardiovascular;  Laterality: N/A;  . Cardiac catheterization N/A 05/03/2015    Procedure: Right/Left Heart Cath and Coronary Angiography;  Surgeon: Larey Dresser, MD;  Location: [facility]  CV LAB;  Service: Cardiovascular;  Laterality: N/A;  . Cardiac catheterization N/A 05/03/2015    Procedure: Coronary Stent Intervention;  Surgeon: Jettie Booze, MD;  Location: [facility] CV LAB;  Service: Cardiovascular;  Laterality: N/A;  . Cardioversion N/A 08/24/2015    Procedure: CARDIOVERSION;  Surgeon: Thayer Headings, MD;  Location: [place];  Service: Cardiovascular;  Laterality: N/A;  . Cardiac catheterization N/A 11/04/2015    Procedure: Left Heart Cath and Coronary Angiography;  Surgeon: Larey Dresser, MD;  Location: [place] CV LAB;  Service: Cardiovascular;  Laterality: N/A;   Family History   Problem Relation Age of Onset  . Diabetes Mother   . Hypertension Mother   . Heart attack Neg Hx   . Stroke Neg Hx    Social History  Substance Use Topics  . Smoking status: Former Smoker -- 4.00 packs/day for 18 years    Types: Cigarettes    Quit date: 08/14/1983  . Smokeless tobacco: Never Used  . Alcohol Use: 9.6 oz/week    6 Cans of beer, 10 Shots of liquor per week     Comment: 05/03/2015 "1-2 beers q 3 days; 4-5 shots of liquor twice/wk"    Review of Systems  Constitutional: Negative for diaphoresis.  Respiratory: Negative for shortness of breath.   Cardiovascular: Positive for chest pain.  Gastrointestinal: Negative for vomiting.  Psychiatric/Behavioral: Negative for self-injury.  All other systems reviewed and are negative.   Allergies  Ace inhibitors and Other  Home Medications   Prior to Admission medications   Medication Sig Start Date End Date Taking? Authorizing Provider  acetaminophen (TYLENOL) 500 MG tablet Take 1,000 mg by mouth every 6 (six) hours as needed (pain). Reported on 08/18/2015    Historical Provider, MD  amLODipine (NORVASC) 5 MG tablet Take 1 tablet (5 mg total) by mouth daily. 12/29/15   Tresa Garter, MD  atorvastatin (LIPITOR) 80 MG tablet Take 1 tablet (80 mg total) by mouth daily. 12/29/15   Tresa Garter, MD  carvedilol (COREG) 12.5 MG tablet Take 1 tablet (12.5 mg total) by mouth 2 (two) times daily with a meal. 12/29/15   Tresa Garter, MD  clopidogrel (PLAVIX) 75 MG tablet Take 1 tablet (75 mg total) by mouth daily with breakfast. 12/29/15   Tresa Garter, MD  furosemide (LASIX) 40 MG tablet TAKE ONE TABLET BY MOUTH IN THE MORNING AND ONE-HALF TABLET IN THE EVENING 12/29/15   Tresa Garter, MD  hydrALAZINE (APRESOLINE) 50 MG tablet Take 1 tablet (50 mg total) by mouth 3 (three) times daily. 12/29/15   Tresa Garter, MD  Insulin Glargine (LANTUS SOLOSTAR) 100 UNIT/ML Solostar Pen Inject 50 Units into the skin  every morning. And pen needles 1/day 12/29/15   Tresa Garter, MD  metFORMIN (GLUCOPHAGE) 1000 MG tablet Take 1 tablet (1,000 mg total) by mouth 2 (two) times daily with a meal. 12/29/15   Tresa Garter, MD  nitroGLYCERIN (NITROSTAT) 0.4 MG SL tablet Place 0.4 mg under the tongue every 5 (five) minutes as needed for chest pain (x 3 tabs daily). Reported on 08/18/2015    Historical Provider, MD  potassium chloride SA (K-DUR,KLOR-CON) 20 MEQ tablet Take 1 tablet (20 mEq total) by mouth daily. 12/29/15   Tresa Garter, MD  spironolactone (ALDACTONE) 25 MG tablet Take 1 tablet (25 mg total) by mouth daily. 12/29/15   Tresa Garter, MD  warfarin (COUMADIN) 5 MG tablet Take as directed by coumadin  clinic Patient taking differently: Take 2.5-5 mg by mouth daily. Take 2.5mg  by mouth on Tuesday and Sunday. Take 5 mg by mouth on all other days 09/01/15   Larey Dresser, MD   BP 137/83 mmHg  Pulse 68  Temp(Src) 98.7 F (37.1 C) (Oral)  Resp 18  Ht 6' (1.829 m)  Wt 217 lb (98.431 kg)  BMI 29.42 kg/m2  SpO2 98% Physical Exam  Constitutional: He is oriented to person, place, and time. Vital signs are normal. He appears well-developed and well-nourished.  Non-toxic appearance. He does not appear ill. No distress.  HENT:  Head: Normocephalic and atraumatic.  Nose: Nose normal.  Mouth/Throat: Oropharynx is clear and moist. No oropharyngeal exudate.  Eyes: Conjunctivae and EOM are normal. Pupils are equal, round, and reactive to light. No scleral icterus.  Neck: Normal range of motion. Neck supple. No tracheal deviation, no edema, no erythema and normal range of motion present. No thyroid mass and no thyromegaly present.  Cardiovascular: Normal rate, regular rhythm, S1 normal, S2 normal, normal heart sounds, intact distal pulses and normal pulses.  Exam reveals no gallop and no friction rub.   No murmur heard. Pulmonary/Chest: Effort normal and breath sounds normal. No respiratory  distress. He has no wheezes. He has no rhonchi. He has no rales.  Abdominal: Soft. Normal appearance and bowel sounds are normal. He exhibits no distension, no ascites and no mass. There is no hepatosplenomegaly. There is no tenderness. There is no rebound, no guarding and no CVA tenderness.  Musculoskeletal: Normal range of motion. He exhibits no edema or tenderness.  Lymphadenopathy:    He has no cervical adenopathy.  Neurological: He is alert and oriented to person, place, and time. He has normal strength. No cranial nerve deficit or sensory deficit.  Skin: Skin is warm, dry and intact. No petechiae and no rash noted. He is not diaphoretic. No erythema. No pallor.  Billiary drain in RUQ, wound site clean dry and intact, no erythema no drainage  Psychiatric: He has a normal mood and affect. His behavior is normal. Judgment normal.  Nursing note and vitals reviewed.   ED Course  Procedures  DIAGNOSTIC STUDIES: Oxygen Saturation is 98% on RA, normal by my interpretation.  COORDINATION OF CARE: 3:32 AM-Will do IV pain medication and CT. Discussed treatment plan with pt at bedside and pt agreed to plan.   Labs Review Labs Reviewed  BASIC METABOLIC PANEL - Abnormal; Notable for the following:    Potassium 3.4 (*)    Glucose, Bld 125 (*)    Creatinine, Ser 1.52 (*)    GFR calc non Af Amer 48 (*)    GFR calc Af Amer 55 (*)    All other components within normal limits  CBC - Abnormal; Notable for the following:    RDW 15.6 (*)    All other components within normal limits  PROTIME-INR - Abnormal; Notable for the following:    Prothrombin Time 21.9 (*)    INR 1.92 (*)    All other components within normal limits  TROPONIN I  TROPONIN I  TROPONIN I  I-STAT TROPOININ, ED    Imaging Review Dg Chest 2 View  01/13/2016  CLINICAL DATA:  Acute onset of right-sided chest pain. Mild shortness of breath. Initial encounter. EXAM: CHEST  2 VIEW COMPARISON:  Chest radiograph from 08/22/2015  FINDINGS: The lungs are well-aerated. Vascular congestion is noted. Peribronchial thickening is seen. Mild left basilar atelectasis is noted. There is no evidence of  pleural effusion or pneumothorax. The heart is borderline normal in size. No acute osseous abnormalities are seen. IMPRESSION: Vascular congestion noted. Peribronchial thickening seen. Mild left basilar atelectasis noted. Electronically Signed   By: Garald Balding M.D.   On: 01/13/2016 02:35   Ct Chest W Contrast  01/13/2016  CLINICAL DATA:  Chest pain. Right upper quadrant pain. Biliary drain in place for 6 months. EXAM: CT CHEST, ABDOMEN, AND PELVIS WITH CONTRAST TECHNIQUE: Multidetector CT imaging of the chest, abdomen and pelvis was performed following the standard protocol during bolus administration of intravenous contrast. CONTRAST:  12mL ISOVUE-300 IOPAMIDOL (ISOVUE-300) INJECTION 61% COMPARISON:  CT abdomen 07/19/2015 FINDINGS: CT CHEST FINDINGS Mediastinum/Lymph Nodes: Mild atherosclerosis of thoracic aorta. Coronary artery calcifications versus stents. No hilar or mediastinal adenopathy. Small mediastinal lymph nodes not enlarged by size criteria, for example lower paratracheal node measures 7 mm. No pericardial effusion. Lungs/Pleura: Minimal scattered atelectasis. No consolidation to suggest pneumonia. Minimal smooth septal thickening. No pulmonary mass or suspicious nodule. No pleural effusion. Musculoskeletal: There are no acute or suspicious osseous abnormalities. Degenerative disc disease in the thoracic spine. CT ABDOMEN PELVIS FINDINGS Hepatobiliary: Percutaneous cholecystostomy tube in place. Gallbladder is decompressed. Gallstones are noted including a gallstone at the neck. Gallbladder wall thickening measuring 6 mm. No pericholecystic inflammation. No biliary dilatation. Small hyper attenuating lesion in segment 3, previous MRI characterization consistent with hemangioma. Mild steatosis. No intrahepatic collection or new focal  lesion. Pancreas: No ductal dilatation or inflammation. Spleen: Normal in size. Adrenals/Urinary Tract: No adrenal nodule. No hydronephrosis. Cysts in the upper left kidney, previously characterized by MRI in April 2015 as Bosniak 2 cyst. No new renal lesion. No perinephric edema. Symmetric enhancement and excretion on delayed phase imaging. Bladder minimally distended. No bladder wall thickening. Stomach/Bowel: Small hiatal hernia. Stomach is decompressed. No duodenum wall thickening. There are no dilated or thickened bowel loops. Minimal colonic diverticulosis without diverticulitis. Small to moderate stool burden. The appendix is normal. Vascular/Lymphatic: Atherosclerosis and tortuosity of normal caliber thoracic aorta. No retroperitoneal adenopathy. IVC is patent. Reproductive: Prominent sized prostate gland. Other: No free air, or free fluid or intra-abdominal fluid collection. Fat within both inguinal canals. Musculoskeletal: Bilateral L5 pars interarticularis defects with grade 1 anterolisthesis. Degenerative disc disease at L3-L4. There are no acute or suspicious osseous abnormalities. Probable prior musculotendinous injury at the pubic symphyseal insertion. IMPRESSION: 1. Percutaneous cholecystostomy tube in place. Intraluminal gallstones including a gallstone at the gallbladder neck. Mild gallbladder wall thickening may be due to nondistention, no pericholecystic edema. 2. No additional acute abnormality in the chest, abdomen, or pelvis. 3. Additional chronic findings are unchanged. Electronically Signed   By: Jeb Levering M.D.   On: 01/13/2016 04:46   Ct Abdomen Pelvis W Contrast  01/13/2016  CLINICAL DATA:  Chest pain. Right upper quadrant pain. Biliary drain in place for 6 months. EXAM: CT CHEST, ABDOMEN, AND PELVIS WITH CONTRAST TECHNIQUE: Multidetector CT imaging of the chest, abdomen and pelvis was performed following the standard protocol during bolus administration of intravenous contrast.  CONTRAST:  157mL ISOVUE-300 IOPAMIDOL (ISOVUE-300) INJECTION 61% COMPARISON:  CT abdomen 07/19/2015 FINDINGS: CT CHEST FINDINGS Mediastinum/Lymph Nodes: Mild atherosclerosis of thoracic aorta. Coronary artery calcifications versus stents. No hilar or mediastinal adenopathy. Small mediastinal lymph nodes not enlarged by size criteria, for example lower paratracheal node measures 7 mm. No pericardial effusion. Lungs/Pleura: Minimal scattered atelectasis. No consolidation to suggest pneumonia. Minimal smooth septal thickening. No pulmonary mass or suspicious nodule. No pleural effusion. Musculoskeletal: There are no  acute or suspicious osseous abnormalities. Degenerative disc disease in the thoracic spine. CT ABDOMEN PELVIS FINDINGS Hepatobiliary: Percutaneous cholecystostomy tube in place. Gallbladder is decompressed. Gallstones are noted including a gallstone at the neck. Gallbladder wall thickening measuring 6 mm. No pericholecystic inflammation. No biliary dilatation. Small hyper attenuating lesion in segment 3, previous MRI characterization consistent with hemangioma. Mild steatosis. No intrahepatic collection or new focal lesion. Pancreas: No ductal dilatation or inflammation. Spleen: Normal in size. Adrenals/Urinary Tract: No adrenal nodule. No hydronephrosis. Cysts in the upper left kidney, previously characterized by MRI in April 2015 as Bosniak 2 cyst. No new renal lesion. No perinephric edema. Symmetric enhancement and excretion on delayed phase imaging. Bladder minimally distended. No bladder wall thickening. Stomach/Bowel: Small hiatal hernia. Stomach is decompressed. No duodenum wall thickening. There are no dilated or thickened bowel loops. Minimal colonic diverticulosis without diverticulitis. Small to moderate stool burden. The appendix is normal. Vascular/Lymphatic: Atherosclerosis and tortuosity of normal caliber thoracic aorta. No retroperitoneal adenopathy. IVC is patent. Reproductive: Prominent  sized prostate gland. Other: No free air, or free fluid or intra-abdominal fluid collection. Fat within both inguinal canals. Musculoskeletal: Bilateral L5 pars interarticularis defects with grade 1 anterolisthesis. Degenerative disc disease at L3-L4. There are no acute or suspicious osseous abnormalities. Probable prior musculotendinous injury at the pubic symphyseal insertion. IMPRESSION: 1. Percutaneous cholecystostomy tube in place. Intraluminal gallstones including a gallstone at the gallbladder neck. Mild gallbladder wall thickening may be due to nondistention, no pericholecystic edema. 2. No additional acute abnormality in the chest, abdomen, or pelvis. 3. Additional chronic findings are unchanged. Electronically Signed   By: Jeb Levering M.D.   On: 01/13/2016 04:46   I have personally reviewed and evaluated these images and lab results as part of my medical decision-making.   EKG Interpretation   Date/Time:  Friday January 13 2016 01:33:59 EDT Ventricular Rate:  72 PR Interval:  198 QRS Duration: 94 QT Interval:  416 QTC Calculation: 455 R Axis:   11 Text Interpretation:  Normal sinus rhythm Nonspecific T wave abnormality  Abnormal ECG new TWI inf and lateral leads Confirmed by Glynn Octave [phone]) on 01/13/2016 3:39:21 AM      MDM   Final diagnoses:  None    Patient presents to the ED for chest pain on the R side that is worst with movement.  Family has concern the drain may be impacting him vs a repeat heart attack.    CT scan was obtained and does not reveal any problems with the drain.  EKG reveals new TWI.  I spoke with Dr. Porfirio Mylar to complete cardiac work up.  Patient given full dose aspirin.   I personally performed the services described in this documentation, which was scribed in my presence. The recorded information has been reviewed and is accurate.      Everlene Balls, MD 01/13/16 [phone]

## 2016-01-13 NOTE — Progress Notes (Signed)
Discharge teaching and instructions reviewed, VSS. Pt has no further questions at this time. Discharging home via daughter

## 2016-01-13 NOTE — Care Management Obs Status (Signed)
[place] NOTIFICATION   Patient Details  Name: [name] [name] MRN: [id number] Date of Birth: Dec 24, 1955   Medicare Observation Status Notification Given:  Yes    Bethena Roys, RN 01/13/2016, 12:43 PM

## 2016-01-13 NOTE — H&P (Signed)
History and Physical    [name] [name] [id number] DOB: [birth year] DOA: 01/13/2016   PCP: Angelica Chessman, MD   Patient coming from:  Home  Chief Complaint: Chest pain  HPI: [name] [name] is a 60 y.o. male with medical history significant for HTN, HLD DM, PAF on Coumadin, CAD, h/o cholelithiasis/cholecystitis with biliary drain placed in October 2016, h/o Osteosarcoma presenting to the emergency department with constant, unchanged, right-sided chest pain, worse with movement and deep inspiration, non reproducible, present since last night. He denies any shortness of breath, vomiting diaphoresis, shoulder pain, recent injury or heavy lifting. Denies fevers, chills, night sweats, vision changes, or mucositis. Denies any respiratory complaints. Denies lower extremity swelling. Denies nausea, heartburn or change in bowel habits. Denies abdominal pain. Appetite is normal. Denies any dysuria. Denies abnormal skin rashes, or neuropathy. Denies any bleeding issues such as epistaxis, hematemesis, hematuria or hematochezia. Ambulating without difficulty. The patient had already 2 cardiac stents in 2013, and a cardiac catheterization in 2016, and most recently on March 2017. He also has a cardioversion history in January 2017. He was to have a cardiac clearance on June 9 for removal of his biliary stent.    ED Course:  BP 132/87 mmHg  Pulse 84  Temp(Src) 97.9 F (36.6 C) (Oral)  Resp 16  Ht 5\' 9"  (1.753 m)  Wt 98.158 kg (216 lb 6.4 oz)  BMI 31.94 kg/m2  SpO2 94%  Received Morphine and NTG without relief. EKG NSR  new TWI inf and lat leads  QTC 455 CT A/P 1. Percutaneous cholecystostomy tube in place. Intraluminal gallstones including a gallstone at the gallbladder neck. Mild gallbladder wall thickening may be due to nondistention, no pericholecystic edema. CT chest neg masses or other acute abnormalities. Cards consulted  Review of Systems: As per HPI otherwise 10 point review of systems  negative.   Past Medical History  Diagnosis Date  . Hypertension   . Gout   . PAF (paroxysmal atrial fibrillation) (HCC)     On coumadin  . LIVER FUNCTION TESTS, ABNORMAL, HX OF 12/30/2006  . Rectal bleeding 12/18/2011    Scheduled for colonoscopy.    [name] HEPATITIS B, CHRONIC 12/30/2006  . HYPERCHOLESTEROLEMIA 07/11/2010  . MITRAL REGURGITATION 12/30/2006  . COLONIC POLYPS, HX OF 12/30/2006  . CAD (coronary artery disease), native coronary artery     a. Nonobstructive CAD by cath 2013 - diffuse distal and branch vessel CAD, no severe disease in the major coronaries, LV mild global hypokinesis, EF 45%. b. ETT-Sestamibi 5/14: EF 31%, small fixed inferior defect with no ischemia.  . Chronic CHF ([place])     a. Mixed ICM/NICM (?EtOH). EF 35% in 2008. Echo 5/13: EF 60-65%, mod LVH, EF 45% on V gram in 12/2011. EF 12/2012: EF 50-55%, mild LVH, inferobasal HK, mild MR. ETT-Ses 5/14 EF 41%. Cardiac MRI 5/14: EF 44%, mild global HK, subepicardial delayed enhancement in nonspecific RV insertion pattern.  . H/O atrial flutter 2007    a. Ablations in 2007, 2008.  [name] History of medication noncompliance   . History of alcohol abuse   . 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 Heart murmur   . Anginal pain ([place])   . Sleep apnea     "suppose to send mask but they never did" (05/03/2015)  . Type II diabetes mellitus ([place])   . Left sciatic nerve pain since 04/2015  . Anxiety   . Cholecystitis   . Arrhythmia   . History of nuclear  stress test     Myoview 3/17: + chest pain; EF 33%, downsloping ST depression 2, V4-6, LVH with strain, no ischemia on images; intermediate risk     Past Surgical History  Procedure Laterality Date  . Osteochondroma excision Left 1972    "took bone tumor off my shoulder"  . Coronary angioplasty  12/05/01  . A flutter ablation  2007, 2008    catheter ablation   . Left heart catheterization with coronary angiogram N/A 12/19/2011    Procedure: LEFT HEART CATHETERIZATION  WITH CORONARY ANGIOGRAM;  Surgeon: Larey Dresser, MD;  Location: [facility] CATH LAB;  Service: Cardiovascular;  Laterality: N/A;  . Cardiac catheterization N/A 05/03/2015    Procedure: Right/Left Heart Cath and Coronary Angiography;  Surgeon: Larey Dresser, MD;  Location: [place] CV LAB;  Service: Cardiovascular;  Laterality: N/A;  . Cardiac catheterization N/A 05/03/2015    Procedure: Coronary Stent Intervention;  Surgeon: Jettie Booze, MD;  Location: [place] CV LAB;  Service: Cardiovascular;  Laterality: N/A;  . Cardioversion N/A 08/24/2015    Procedure: CARDIOVERSION;  Surgeon: Thayer Headings, MD;  Location: [place];  Service: Cardiovascular;  Laterality: N/A;  . Cardiac catheterization N/A 11/04/2015    Procedure: Left Heart Cath and Coronary Angiography;  Surgeon: Larey Dresser, MD;  Location: [place] CV LAB;  Service: Cardiovascular;  Laterality: N/A;     reports that he quit smoking about 32 years ago. His smoking use included Cigarettes. He has a 72 pack-year smoking history. He has never used smokeless tobacco. He reports that he drinks about 9.6 oz of alcohol per week. He reports that he uses illicit drugs (Marijuana). Walks unassisted  with cane  with walker Patient is on  wheelchair  Allergies  Allergen Reactions  . Ace Inhibitors Cough  . Other Hives and Other (See Comments)    Patient reports developing hives after receiving "some antibiotic given in 1980''s at [facility]". He does not know which antibiotic.    Family History  Problem Relation Age of Onset  . Diabetes Mother   . Hypertension Mother   . Heart attack Neg Hx   . Stroke Neg Hx       Prior to Admission medications   Medication Sig Start Date End Date Taking? Authorizing Provider  acetaminophen (TYLENOL) 500 MG tablet Take 1,000 mg by mouth every 6 (six) hours as needed (pain). Reported on 08/18/2015   Yes Historical Provider, MD  amLODipine (NORVASC) 5 MG tablet Take 1 tablet (5  mg total) by mouth daily. 12/29/15  Yes Tresa Garter, MD  atorvastatin (LIPITOR) 80 MG tablet Take 1 tablet (80 mg total) by mouth daily. 12/29/15  Yes Tresa Garter, MD  carvedilol (COREG) 12.5 MG tablet Take 1 tablet (12.5 mg total) by mouth 2 (two) times daily with a meal. 12/29/15  Yes Tresa Garter, MD  clopidogrel (PLAVIX) 75 MG tablet Take 1 tablet (75 mg total) by mouth daily with breakfast. 12/29/15  Yes Olugbemiga E Doreene Burke, MD  furosemide (LASIX) 40 MG tablet TAKE ONE TABLET BY MOUTH IN THE MORNING AND ONE-HALF TABLET IN THE EVENING 12/29/15  Yes Tresa Garter, MD  hydrALAZINE (APRESOLINE) 50 MG tablet Take 1 tablet (50 mg total) by mouth 3 (three) times daily. 12/29/15  Yes Tresa Garter, MD  Insulin Glargine (LANTUS SOLOSTAR) 100 UNIT/ML Solostar Pen Inject 50 Units into the skin every morning. And pen needles 1/day 12/29/15  Yes Tresa Garter, MD  metFORMIN (GLUCOPHAGE) 1000 MG tablet Take 1 tablet (1,000 mg total) by mouth 2 (two) times daily with a meal. 12/29/15  Yes Olugbemiga E Doreene Burke, MD  nitroGLYCERIN (NITROSTAT) 0.4 MG SL tablet Place 0.4 mg under the tongue every 5 (five) minutes as needed for chest pain (x 3 tabs daily). Reported on 08/18/2015   Yes Historical Provider, MD  potassium chloride SA (K-DUR,KLOR-CON) 20 MEQ tablet Take 1 tablet (20 mEq total) by mouth daily. 12/29/15  Yes Tresa Garter, MD  spironolactone (ALDACTONE) 25 MG tablet Take 1 tablet (25 mg total) by mouth daily. 12/29/15  Yes Tresa Garter, MD  warfarin (COUMADIN) 5 MG tablet Take as directed by coumadin clinic Patient taking differently: Take 2.5-5 mg by mouth daily. Take 2.5mg  by mouth on Tuesday and Sunday. Take 5 mg by mouth on all other days 09/01/15  Yes Larey Dresser, MD    Physical Exam:    Filed Vitals:   01/13/16 0615 01/13/16 0630 01/13/16 0700 01/13/16 0840  BP: 147/94 165/89 138/83 132/87  Pulse: 80 83 79 84  Temp:   98 F (36.7 C) 97.9 F (36.6  C)  TempSrc:   Oral Oral  Resp: 15 15 16 16   Height:   5\' 9"  (1.753 m)   Weight:   98.158 kg (216 lb 6.4 oz)   SpO2: 97% 94% 97% 94%      Constitutional: NAD, calm, comfortable Filed Vitals:   01/13/16 0615 01/13/16 0630 01/13/16 0700 01/13/16 0840  BP: 147/94 165/89 138/83 132/87  Pulse: 80 83 79 84  Temp:   98 F (36.7 C) 97.9 F (36.6 C)  TempSrc:   Oral Oral  Resp: 15 15 16 16   Height:   5\' 9"  (1.753 m)   Weight:   98.158 kg (216 lb 6.4 oz)   SpO2: 97% 94% 97% 94%   Eyes: PERRL, lids and conjunctivae normal ENMT: Mucous membranes are moist. Posterior pharynx clear of any exudate or lesions.Normal dentition.  Neck: normal, supple, no masses, no thyromegaly Respiratory: clear to auscultation bilaterally, no wheezing, no crackles. Normal respiratory effort. No accessory muscle use.  Cardiovascular: Irregularly regular rate and rhythm, no murmurs / rubs / gallops. No extremity edema. 2+ pedal pulses. No carotid bruits.  Abdomen: no tenderness, no masses palpated. No hepatosplenomegaly. Bowel sounds positive. Biliary drain RUQ without drainage  Musculoskeletal: no clubbing / cyanosis. No joint deformity upper and lower extremities. Good ROM, no contractures. Normal muscle tone.  Skin: no rashes, lesions, ulcers. No induration Neurologic: CN 2-12 grossly intact. Sensation intact, DTR normal. Strength 5/5 in all 4.  Psychiatric: Normal judgment and insight. Alert and oriented x 3. Normal mood.     Labs on Admission: I have personally reviewed following labs and imaging studies  CBC:  Recent Labs Lab 01/13/16 0141  WBC 7.5  HGB 13.5  HCT 42.0  MCV 84.0  PLT [id number]    Basic Metabolic Panel:  Recent Labs Lab 01/13/16 0141  NA 135  K 3.4*  CL 103  CO2 22  GLUCOSE 125*  BUN 19  CREATININE 1.52*  CALCIUM 9.4    GFR: Estimated Creatinine Clearance: 59 mL/min (by C-G formula based on Cr of 1.52).  Liver Function Tests: No results for input(s): AST, ALT,  ALKPHOS, BILITOT, PROT, ALBUMIN in the last 168 hours. No results for input(s): LIPASE, AMYLASE in the last 168 hours. No results for input(s): AMMONIA in the last 168 hours.  Coagulation Profile:  Recent Labs Lab 01/13/16  0141  INR 1.92*    Cardiac Enzymes:  Recent Labs Lab 01/13/16 0602  TROPONINI <0.03    BNP (last 3 results)  Recent Labs  04/22/15 1559  PROBNP 59.0    HbA1C: No results for input(s): HGBA1C in the last 72 hours.  CBG:  Recent Labs Lab 01/06/16 1148 01/11/16 1144 01/13/16 0812  GLUCAP 139* 253* 129*    Lipid Profile: No results for input(s): CHOL, HDL, LDLCALC, TRIG, CHOLHDL, LDLDIRECT in the last 72 hours.  Thyroid Function Tests: No results for input(s): TSH, T4TOTAL, FREET4, T3FREE, THYROIDAB in the last 72 hours.  Anemia Panel: No results for input(s): VITAMINB12, FOLATE, FERRITIN, TIBC, IRON, RETICCTPCT in the last 72 hours.  Urine analysis:    Component Value Date/Time   COLORURINE YELLOW 11/08/2013 1042   APPEARANCEUR CLEAR 11/08/2013 1042   LABSPEC 1.035* 11/08/2013 1042   PHURINE 5.5 11/08/2013 1042   GLUCOSEU >1000* 11/08/2013 1042   HGBUR LARGE* 11/08/2013 1042   HGBUR moderate 09/27/2010 1646   BILIRUBINUR neg 05/18/2014 1239   BILIRUBINUR NEGATIVE 11/08/2013 1042   KETONESUR NEGATIVE 11/08/2013 1042   PROTEINUR 30 05/18/2014 1239   PROTEINUR NEGATIVE 11/08/2013 1042   UROBILINOGEN 1.0 05/18/2014 1239   UROBILINOGEN 0.2 11/08/2013 1042   NITRITE neg 05/18/2014 1239   NITRITE NEGATIVE 11/08/2013 1042   LEUKOCYTESUR Negative 05/18/2014 1239    Sepsis Labs: @LABRCNTIP (procalcitonin:4,lacticidven:4) )No results found for this or any previous visit (from the past 240 hour(s)).   Radiological Exams on Admission: Dg Chest 2 View  01/13/2016  CLINICAL DATA:  Acute onset of right-sided chest pain. Mild shortness of breath. Initial encounter. EXAM: CHEST  2 VIEW COMPARISON:  Chest radiograph from 08/22/2015 FINDINGS:  The lungs are well-aerated. Vascular congestion is noted. Peribronchial thickening is seen. Mild left basilar atelectasis is noted. There is no evidence of pleural effusion or pneumothorax. The heart is borderline normal in size. No acute osseous abnormalities are seen. IMPRESSION: Vascular congestion noted. Peribronchial thickening seen. Mild left basilar atelectasis noted. Electronically Signed   By: Garald Balding M.D.   On: 01/13/2016 02:35   Ct Chest W Contrast  01/13/2016  CLINICAL DATA:  Chest pain. Right upper quadrant pain. Biliary drain in place for 6 months. EXAM: CT CHEST, ABDOMEN, AND PELVIS WITH CONTRAST TECHNIQUE: Multidetector CT imaging of the chest, abdomen and pelvis was performed following the standard protocol during bolus administration of intravenous contrast. CONTRAST:  191mL ISOVUE-300 IOPAMIDOL (ISOVUE-300) INJECTION 61% COMPARISON:  CT abdomen 07/19/2015 FINDINGS: CT CHEST FINDINGS Mediastinum/Lymph Nodes: Mild atherosclerosis of thoracic aorta. Coronary artery calcifications versus stents. No hilar or mediastinal adenopathy. Small mediastinal lymph nodes not enlarged by size criteria, for example lower paratracheal node measures 7 mm. No pericardial effusion. Lungs/Pleura: Minimal scattered atelectasis. No consolidation to suggest pneumonia. Minimal smooth septal thickening. No pulmonary mass or suspicious nodule. No pleural effusion. Musculoskeletal: There are no acute or suspicious osseous abnormalities. Degenerative disc disease in the thoracic spine. CT ABDOMEN PELVIS FINDINGS Hepatobiliary: Percutaneous cholecystostomy tube in place. Gallbladder is decompressed. Gallstones are noted including a gallstone at the neck. Gallbladder wall thickening measuring 6 mm. No pericholecystic inflammation. No biliary dilatation. Small hyper attenuating lesion in segment 3, previous MRI characterization consistent with hemangioma. Mild steatosis. No intrahepatic collection or new focal lesion.  Pancreas: No ductal dilatation or inflammation. Spleen: Normal in size. Adrenals/Urinary Tract: No adrenal nodule. No hydronephrosis. Cysts in the upper left kidney, previously characterized by MRI in April 2015 as Bosniak 2 cyst. No new renal  lesion. No perinephric edema. Symmetric enhancement and excretion on delayed phase imaging. Bladder minimally distended. No bladder wall thickening. Stomach/Bowel: Small hiatal hernia. Stomach is decompressed. No duodenum wall thickening. There are no dilated or thickened bowel loops. Minimal colonic diverticulosis without diverticulitis. Small to moderate stool burden. The appendix is normal. Vascular/Lymphatic: Atherosclerosis and tortuosity of normal caliber thoracic aorta. No retroperitoneal adenopathy. IVC is patent. Reproductive: Prominent sized prostate gland. Other: No free air, or free fluid or intra-abdominal fluid collection. Fat within both inguinal canals. Musculoskeletal: Bilateral L5 pars interarticularis defects with grade 1 anterolisthesis. Degenerative disc disease at L3-L4. There are no acute or suspicious osseous abnormalities. Probable prior musculotendinous injury at the pubic symphyseal insertion. IMPRESSION: 1. Percutaneous cholecystostomy tube in place. Intraluminal gallstones including a gallstone at the gallbladder neck. Mild gallbladder wall thickening may be due to nondistention, no pericholecystic edema. 2. No additional acute abnormality in the chest, abdomen, or pelvis. 3. Additional chronic findings are unchanged. Electronically Signed   By: Jeb Levering M.D.   On: 01/13/2016 04:46   Ct Abdomen Pelvis W Contrast  01/13/2016  CLINICAL DATA:  Chest pain. Right upper quadrant pain. Biliary drain in place for 6 months. EXAM: CT CHEST, ABDOMEN, AND PELVIS WITH CONTRAST TECHNIQUE: Multidetector CT imaging of the chest, abdomen and pelvis was performed following the standard protocol during bolus administration of intravenous contrast. CONTRAST:   187mL ISOVUE-300 IOPAMIDOL (ISOVUE-300) INJECTION 61% COMPARISON:  CT abdomen 07/19/2015 FINDINGS: CT CHEST FINDINGS Mediastinum/Lymph Nodes: Mild atherosclerosis of thoracic aorta. Coronary artery calcifications versus stents. No hilar or mediastinal adenopathy. Small mediastinal lymph nodes not enlarged by size criteria, for example lower paratracheal node measures 7 mm. No pericardial effusion. Lungs/Pleura: Minimal scattered atelectasis. No consolidation to suggest pneumonia. Minimal smooth septal thickening. No pulmonary mass or suspicious nodule. No pleural effusion. Musculoskeletal: There are no acute or suspicious osseous abnormalities. Degenerative disc disease in the thoracic spine. CT ABDOMEN PELVIS FINDINGS Hepatobiliary: Percutaneous cholecystostomy tube in place. Gallbladder is decompressed. Gallstones are noted including a gallstone at the neck. Gallbladder wall thickening measuring 6 mm. No pericholecystic inflammation. No biliary dilatation. Small hyper attenuating lesion in segment 3, previous MRI characterization consistent with hemangioma. Mild steatosis. No intrahepatic collection or new focal lesion. Pancreas: No ductal dilatation or inflammation. Spleen: Normal in size. Adrenals/Urinary Tract: No adrenal nodule. No hydronephrosis. Cysts in the upper left kidney, previously characterized by MRI in April 2015 as Bosniak 2 cyst. No new renal lesion. No perinephric edema. Symmetric enhancement and excretion on delayed phase imaging. Bladder minimally distended. No bladder wall thickening. Stomach/Bowel: Small hiatal hernia. Stomach is decompressed. No duodenum wall thickening. There are no dilated or thickened bowel loops. Minimal colonic diverticulosis without diverticulitis. Small to moderate stool burden. The appendix is normal. Vascular/Lymphatic: Atherosclerosis and tortuosity of normal caliber thoracic aorta. No retroperitoneal adenopathy. IVC is patent. Reproductive: Prominent sized  prostate gland. Other: No free air, or free fluid or intra-abdominal fluid collection. Fat within both inguinal canals. Musculoskeletal: Bilateral L5 pars interarticularis defects with grade 1 anterolisthesis. Degenerative disc disease at L3-L4. There are no acute or suspicious osseous abnormalities. Probable prior musculotendinous injury at the pubic symphyseal insertion. IMPRESSION: 1. Percutaneous cholecystostomy tube in place. Intraluminal gallstones including a gallstone at the gallbladder neck. Mild gallbladder wall thickening may be due to nondistention, no pericholecystic edema. 2. No additional acute abnormality in the chest, abdomen, or pelvis. 3. Additional chronic findings are unchanged. Electronically Signed   By: Jeb Levering M.D.   On: 01/13/2016  04:46    EKG: Independently reviewed.  Assessment/Plan Principal Problem:   Chest pain Active Problems:   HYPERCHOLESTEROLEMIA   Chronic diastolic CHF (congestive heart failure) (HCC)   Essential hypertension   Cholelithiases- drain in place   Diastolic dysfunction-grade 2 Sept 2016   Diabetes [facility])   Coronary artery disease involving native coronary artery of native heart with angina pectoris ([place])    Chest pain syndrome/known CAD, 2 cardiac stents in 2013, and a cardiac catheterization in 2016, and most recently on March 2017. New ST depression in V5-V6 on EKG. Tn 0.03 CT chest non impressive, no PE. INR 1. 92 CP unrelieved by nitroglycerin, morphine . Afebrile  Admit to tele bed VSS Chest pain order set Cycle troponin EKG in am -morphine, nitroglycerin Coumadin switched to Heparin Continue preadmission beta blocker  Statins  GI cocktail   Hypertension BP 132/87 mmHg  Pulse 84  Temp(Src) 97.9 F (36.6 C) (Oral)  Resp 16  Ht 5\' 9"  (1.753 m)  Wt 98.158 kg (216 lb 6.4 oz)  BMI 31.94 kg/m2  SpO2 94% Controlled Continue home anti-hypertensive medications as per Cards discretion.  Add Hydralazine Q6 hours as needed for  BP 160/90       Hyperlipidemia Continue home statins  H/O Atrial Fibrillation now on SR,  Rate controlled  Coumadin  Switched to heparin per pharmacy  for anticoagulation therapy. -Continue home meds as per Cards  Chronic diastolic heart failure. Last 2 D echo 04/2015 with Gr 2 DD, EF 0000000, nl systolic function.. Last BNP 10/2015 45.5 - Careful use of IVF - Daily weights, strict I/O  Repeat echo  Type II Diabetes  Current blood sugar level is 181  Lab Results  Component Value Date   HGBA1C 8.0 11/10/2015  Hold home oral diabetic medications.  SSI, Lantus half dose to 25 U daily Heart healthy carb modified diet.  Cholelithiasis  s/ Percutaneous cholecystostomy tube in place. Intraluminal gallstones including a gallstone at the gallbladder neck. Mild gallbladder wall thickening may be due to nondistention, no pericholecystic edema Patient  wasscheduled for biliary drain removal on 6/9. May need to be postponed due to current cards issues  AKI  baseline creatinine  1.0   -  Minimize nephrotoxins    Lab Results  Component Value Date   CREATININE 1.52* 01/13/2016   CREATININE 1.00 11/01/2015   CREATININE 1.18 10/20/2015   Gentle hydration Repeat CMET in am       DVT prophylaxis: Heparin Code Status:   Full    Family Communication:  Discussed patient Disposition Plan: Expect patient to be discharged to home after condition improves Consults called:    None Admission status:Obs Tele   Shyler Hamill E, PA-C [facility]   If 7PM-7AM, please contact night-coverage [email or web address] Password [facility]  01/13/2016, 8:41 AM

## 2016-01-13 NOTE — Consult Note (Signed)
CARDIOLOGY CONSULT NOTE   Patient ID: [name] [name] MRN: [id number] DOB/AGE: 09-23-1955 60 y.o.  Admit date: 01/13/2016  Primary Physician   Angelica Chessman, MD Primary Cardiologist: Dr. Aundra Dubin Requesting MD: Dr. Blaine Hamper Reason for Consultation: Chest pain   HPI: [name] [name] is a 60 year old male with a past medical history of Hypertension, CHF, paroxysmal A. Fib (on Coumadin), mitral regurgitation, diabetes. Patient has history of CAD, left heart cath done in Sept 2016 after abnormal nuclear study. He received drug-eluting stent 2 to his distal RCA. Repeat cath in March of 2017 showed extensive CAD with no critical lesions, stents in RCA were patent.   He presented to the emergency room on 6-17 with complaints of constant right-sided chest pain that began yesterday. He was getting out of bed with the pain started. He describes the pain as right-sided and sharp like someone is stepping on his chest with high heels. Pain is worse with deep inspiration and with movement. Pain does not get worse with palpation. Denies associated shortness of breath, nausea, or diaphoresis.   Of note, he has a right sided biliary drain that was placed in October 2016. He has an appointment in early June with Dr. Aundra Dubin to get cardiac clearance for drain removal.  He is resting comfortably in bed. Denies ever having any pain like this before. Denies shortness of breath, nausea, and diaphoresis.  Past Medical History  Diagnosis Date  . Hypertension   . Gout   . PAF (paroxysmal atrial fibrillation) (HCC)     On coumadin  . LIVER FUNCTION TESTS, ABNORMAL, HX OF 12/30/2006  . Rectal bleeding 12/18/2011    Scheduled for colonoscopy.    [name] HEPATITIS B, CHRONIC 12/30/2006  . HYPERCHOLESTEROLEMIA 07/11/2010  . MITRAL REGURGITATION 12/30/2006  . COLONIC POLYPS, HX OF 12/30/2006  . CAD (coronary artery disease), native coronary artery     a. Nonobstructive CAD by cath 2013 - diffuse distal and branch vessel CAD,  no severe disease in the major coronaries, LV mild global hypokinesis, EF 45%. b. ETT-Sestamibi 5/14: EF 31%, small fixed inferior defect with no ischemia.  . Chronic CHF ([place])     a. Mixed ICM/NICM (?EtOH). EF 35% in 2008. Echo 5/13: EF 60-65%, mod LVH, EF 45% on V gram in 12/2011. EF 12/2012: EF 50-55%, mild LVH, inferobasal HK, mild MR. ETT-Ses 5/14 EF 41%. Cardiac MRI 5/14: EF 44%, mild global HK, subepicardial delayed enhancement in nonspecific RV insertion pattern.  . H/O atrial flutter 2007    a. Ablations in 2007, 2008.  [name] History of medication noncompliance   . History of alcohol abuse   . 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 Heart murmur   . Anginal pain ([place])   . Sleep apnea     "suppose to send mask but they never did" (05/03/2015)  . Type II diabetes mellitus ([place])   . Left sciatic nerve pain since 04/2015  . Anxiety   . Cholecystitis   . Arrhythmia   . History of nuclear stress test     Myoview 3/17: + chest pain; EF 33%, downsloping ST depression 2, V4-6, LVH with strain, no ischemia on images; intermediate risk      Past Surgical History  Procedure Laterality Date  . Osteochondroma excision Left 1972    "took bone tumor off my shoulder"  . Coronary angioplasty  12/05/01  . A flutter ablation  2007, 2008    catheter ablation   .  Left heart catheterization with coronary angiogram N/A 12/19/2011    Procedure: LEFT HEART CATHETERIZATION WITH CORONARY ANGIOGRAM;  Surgeon: Larey Dresser, MD;  Location[facility] CATH LAB;  Service: Cardiovascular;  Laterality: N/A;  . Cardiac catheterization N/A 05/03/2015    Procedure: Right/Left Heart Cath and Coronary Angiography;  Surgeon: Larey Dresser, MD;  Location: [place] CV LAB;  Service: Cardiovascular;  Laterality: N/A;  . Cardiac catheterization N/A 05/03/2015    Procedure: Coronary Stent Intervention;  Surgeon: Jettie Booze, MD;  Location: [place] CV LAB;  Service: Cardiovascular;  Laterality: N/A;  .  Cardioversion N/A 08/24/2015    Procedure: CARDIOVERSION;  Surgeon: Thayer Headings, MD;  Location: [place];  Service: Cardiovascular;  Laterality: N/A;  . Cardiac catheterization N/A 11/04/2015    Procedure: Left Heart Cath and Coronary Angiography;  Surgeon: Larey Dresser, MD;  Location: [place] CV LAB;  Service: Cardiovascular;  Laterality: N/A;    Allergies  Allergen Reactions  . Ace Inhibitors Cough  . Other Hives and Other (See Comments)    Patient reports developing hives after receiving "some antibiotic given in 1980''s at [facility]". He does not know which antibiotic.    I have reviewed the patient's current medications   . heparin 1,400 Units/hr (01/13/16 0556)   morphine injection, nitroGLYCERIN  Prior to Admission medications   Medication Sig Start Date End Date Taking? Authorizing Provider  acetaminophen (TYLENOL) 500 MG tablet Take 1,000 mg by mouth every 6 (six) hours as needed (pain). Reported on 08/18/2015   Yes Historical Provider, MD  amLODipine (NORVASC) 5 MG tablet Take 1 tablet (5 mg total) by mouth daily. 12/29/15  Yes Tresa Garter, MD  atorvastatin (LIPITOR) 80 MG tablet Take 1 tablet (80 mg total) by mouth daily. 12/29/15  Yes Tresa Garter, MD  carvedilol (COREG) 12.5 MG tablet Take 1 tablet (12.5 mg total) by mouth 2 (two) times daily with a meal. 12/29/15  Yes Tresa Garter, MD  clopidogrel (PLAVIX) 75 MG tablet Take 1 tablet (75 mg total) by mouth daily with breakfast. 12/29/15  Yes Olugbemiga E Doreene Burke, MD  furosemide (LASIX) 40 MG tablet TAKE ONE TABLET BY MOUTH IN THE MORNING AND ONE-HALF TABLET IN THE EVENING 12/29/15  Yes Tresa Garter, MD  hydrALAZINE (APRESOLINE) 50 MG tablet Take 1 tablet (50 mg total) by mouth 3 (three) times daily. 12/29/15  Yes Tresa Garter, MD  Insulin Glargine (LANTUS SOLOSTAR) 100 UNIT/ML Solostar Pen Inject 50 Units into the skin every morning. And pen needles 1/day 12/29/15  Yes  Tresa Garter, MD  metFORMIN (GLUCOPHAGE) 1000 MG tablet Take 1 tablet (1,000 mg total) by mouth 2 (two) times daily with a meal. 12/29/15  Yes Tresa Garter, MD  nitroGLYCERIN (NITROSTAT) 0.4 MG SL tablet Place 0.4 mg under the tongue every 5 (five) minutes as needed for chest pain (x 3 tabs daily). Reported on 08/18/2015   Yes Historical Provider, MD  potassium chloride SA (K-DUR,KLOR-CON) 20 MEQ tablet Take 1 tablet (20 mEq total) by mouth daily. 12/29/15  Yes Tresa Garter, MD  spironolactone (ALDACTONE) 25 MG tablet Take 1 tablet (25 mg total) by mouth daily. 12/29/15  Yes Tresa Garter, MD  warfarin (COUMADIN) 5 MG tablet Take as directed by coumadin clinic Patient taking differently: Take 2.5-5 mg by mouth daily. Take 2.5mg  by mouth on Tuesday and Sunday. Take 5 mg by mouth on all other days 09/01/15  Yes Dalton S  Aundra Dubin, MD     Social History   Social History  . Marital Status: Widowed    Spouse Name: N/A  . Number of Children: 2  . Years of Education: N/A   Occupational History  .     Social History Main Topics  . Smoking status: Former Smoker -- 4.00 packs/day for 18 years    Types: Cigarettes    Quit date: 08/14/1983  . Smokeless tobacco: Never Used  . Alcohol Use: 9.6 oz/week    6 Cans of beer, 10 Shots of liquor per week     Comment: 05/03/2015 "1-2 beers q 3 days; 4-5 shots of liquor twice/wk"  . Drug Use: Yes    Special: Marijuana     Comment: 05/03/2015 "twice/wk maybe"  . Sexual Activity: No   Other Topics Concern  . Not on file   Social History Narrative    Family Status  Relation Status Death Age  . Mother Alive   . Father Deceased   . Daughter Alive   . Daughter Alive    Family History  Problem Relation Age of Onset  . Diabetes Mother   . Hypertension Mother   . Heart attack Neg Hx   . Stroke Neg Hx      ROS:  Full 14 point review of systems complete and found to be negative unless listed above.  Physical Exam: Blood pressure  138/83, pulse 79, temperature 98 F (36.7 C), temperature source Oral, resp. rate 16, height 5\' 9"  (1.753 m), weight 216 lb 6.4 oz (98.158 kg), SpO2 97 %.  General: Well developed, well nourished, male in no acute distress Head: Eyes PERRLA, No xanthomas.   Normocephalic and atraumatic, oropharynx without edema or exudate.  Lungs: CTA Heart: HRRR S1 S2, no rub/gallop, No murmur. pulses are 2+ extrem.   Neck: No carotid bruits. No lymphadenopathy. No JVD. Abdomen: Bowel sounds present, abdomen soft and non-tender without masses or hernias noted. Drain in RUQ Msk:  No spine or cva tenderness. No weakness, no joint deformities or effusions. Extremities: No clubbing or cyanosis.  No edema.  Neuro: Alert and oriented X 3. No focal deficits noted. Psych:  Good affect, responds appropriately Skin: No rashes or lesions noted.  Labs:   Lab Results  Component Value Date   WBC 7.5 01/13/2016   HGB 13.5 01/13/2016   HCT 42.0 01/13/2016   MCV 84.0 01/13/2016   PLT 171 01/13/2016    Recent Labs  01/13/16 0141  INR 1.92*    Recent Labs Lab 01/13/16 0141  NA 135  K 3.4*  CL 103  CO2 22  BUN 19  CREATININE 1.52*  CALCIUM 9.4  GLUCOSE 125*     Recent Labs  01/13/16 0602  TROPONINI <0.03    Recent Labs  01/13/16 0148  TROPIPOC 0.00    Echo: 09/29/14 - Left ventricle: The cavity size was normal. There was mild concentric hypertrophy. Systolic function was normal. The estimated ejection fraction was in the range of 55% to 60%. There is akinesis of the basalinferior myocardium. Features are consistent with a pseudonormal left ventricular filling pattern, with concomitant abnormal relaxation and increased filling pressure (grade 2 diastolic dysfunction). - Aortic valve: Poorly visualized. Probably trileaflet; normal thickness, mildly calcified leaflets. There was trivial regurgitation. - Mitral valve: Mild focal calcification of the anterior  leaflet. There was trivial regurgitation. - Left atrium: The atrium was moderately dilated.  ECG: NSR    Left Heart Cath and Coronary Angiography 11/04/15  Extensive CAD but no severe/critical lesions. He has diffuse disease consistent with his poorly controlled diabetes. Stents in the RCA are patent.   Continue medical management. He can resume coumadin tonight. He could hold coumadin/Plavix for gallbladder surgery in 4/17 (6 months post-RCA stent).    Radiology:  Dg Chest 2 View  01/13/2016  CLINICAL DATA:  Acute onset of right-sided chest pain. Mild shortness of breath. Initial encounter. EXAM: CHEST  2 VIEW COMPARISON:  Chest radiograph from 08/22/2015 FINDINGS: The lungs are well-aerated. Vascular congestion is noted. Peribronchial thickening is seen. Mild left basilar atelectasis is noted. There is no evidence of pleural effusion or pneumothorax. The heart is borderline normal in size. No acute osseous abnormalities are seen. IMPRESSION: Vascular congestion noted. Peribronchial thickening seen. Mild left basilar atelectasis noted. Electronically Signed   By: Garald Balding M.D.   On: 01/13/2016 02:35   Ct Chest W Contrast  01/13/2016  CLINICAL DATA:  Chest pain. Right upper quadrant pain. Biliary drain in place for 6 months. EXAM: CT CHEST, ABDOMEN, AND PELVIS WITH CONTRAST TECHNIQUE: Multidetector CT imaging of the chest, abdomen and pelvis was performed following the standard protocol during bolus administration of intravenous contrast. CONTRAST:  113mL ISOVUE-300 IOPAMIDOL (ISOVUE-300) INJECTION 61% COMPARISON:  CT abdomen 07/19/2015 FINDINGS: CT CHEST FINDINGS Mediastinum/Lymph Nodes: Mild atherosclerosis of thoracic aorta. Coronary artery calcifications versus stents. No hilar or mediastinal adenopathy. Small mediastinal lymph nodes not enlarged by size criteria, for example lower paratracheal node measures 7 mm. No pericardial effusion. Lungs/Pleura: Minimal scattered  atelectasis. No consolidation to suggest pneumonia. Minimal smooth septal thickening. No pulmonary mass or suspicious nodule. No pleural effusion. Musculoskeletal: There are no acute or suspicious osseous abnormalities. Degenerative disc disease in the thoracic spine. CT ABDOMEN PELVIS FINDINGS Hepatobiliary: Percutaneous cholecystostomy tube in place. Gallbladder is decompressed. Gallstones are noted including a gallstone at the neck. Gallbladder wall thickening measuring 6 mm. No pericholecystic inflammation. No biliary dilatation. Small hyper attenuating lesion in segment 3, previous MRI characterization consistent with hemangioma. Mild steatosis. No intrahepatic collection or new focal lesion. Pancreas: No ductal dilatation or inflammation. Spleen: Normal in size. Adrenals/Urinary Tract: No adrenal nodule. No hydronephrosis. Cysts in the upper left kidney, previously characterized by MRI in April 2015 as Bosniak 2 cyst. No new renal lesion. No perinephric edema. Symmetric enhancement and excretion on delayed phase imaging. Bladder minimally distended. No bladder wall thickening. Stomach/Bowel: Small hiatal hernia. Stomach is decompressed. No duodenum wall thickening. There are no dilated or thickened bowel loops. Minimal colonic diverticulosis without diverticulitis. Small to moderate stool burden. The appendix is normal. Vascular/Lymphatic: Atherosclerosis and tortuosity of normal caliber thoracic aorta. No retroperitoneal adenopathy. IVC is patent. Reproductive: Prominent sized prostate gland. Other: No free air, or free fluid or intra-abdominal fluid collection. Fat within both inguinal canals. Musculoskeletal: Bilateral L5 pars interarticularis defects with grade 1 anterolisthesis. Degenerative disc disease at L3-L4. There are no acute or suspicious osseous abnormalities. Probable prior musculotendinous injury at the pubic symphyseal insertion. IMPRESSION: 1. Percutaneous cholecystostomy tube in place.  Intraluminal gallstones including a gallstone at the gallbladder neck. Mild gallbladder wall thickening may be due to nondistention, no pericholecystic edema. 2. No additional acute abnormality in the chest, abdomen, or pelvis. 3. Additional chronic findings are unchanged. Electronically Signed   By: Jeb Levering M.D.   On: 01/13/2016 04:46   Ct Abdomen Pelvis W Contrast  01/13/2016  CLINICAL DATA:  Chest pain. Right upper quadrant pain. Biliary drain in place for 6 months. EXAM: CT CHEST,  ABDOMEN, AND PELVIS WITH CONTRAST TECHNIQUE: Multidetector CT imaging of the chest, abdomen and pelvis was performed following the standard protocol during bolus administration of intravenous contrast. CONTRAST:  138mL ISOVUE-300 IOPAMIDOL (ISOVUE-300) INJECTION 61% COMPARISON:  CT abdomen 07/19/2015 FINDINGS: CT CHEST FINDINGS Mediastinum/Lymph Nodes: Mild atherosclerosis of thoracic aorta. Coronary artery calcifications versus stents. No hilar or mediastinal adenopathy. Small mediastinal lymph nodes not enlarged by size criteria, for example lower paratracheal node measures 7 mm. No pericardial effusion. Lungs/Pleura: Minimal scattered atelectasis. No consolidation to suggest pneumonia. Minimal smooth septal thickening. No pulmonary mass or suspicious nodule. No pleural effusion. Musculoskeletal: There are no acute or suspicious osseous abnormalities. Degenerative disc disease in the thoracic spine. CT ABDOMEN PELVIS FINDINGS Hepatobiliary: Percutaneous cholecystostomy tube in place. Gallbladder is decompressed. Gallstones are noted including a gallstone at the neck. Gallbladder wall thickening measuring 6 mm. No pericholecystic inflammation. No biliary dilatation. Small hyper attenuating lesion in segment 3, previous MRI characterization consistent with hemangioma. Mild steatosis. No intrahepatic collection or new focal lesion. Pancreas: No ductal dilatation or inflammation. Spleen: Normal in size. Adrenals/Urinary Tract:  No adrenal nodule. No hydronephrosis. Cysts in the upper left kidney, previously characterized by MRI in April 2015 as Bosniak 2 cyst. No new renal lesion. No perinephric edema. Symmetric enhancement and excretion on delayed phase imaging. Bladder minimally distended. No bladder wall thickening. Stomach/Bowel: Small hiatal hernia. Stomach is decompressed. No duodenum wall thickening. There are no dilated or thickened bowel loops. Minimal colonic diverticulosis without diverticulitis. Small to moderate stool burden. The appendix is normal. Vascular/Lymphatic: Atherosclerosis and tortuosity of normal caliber thoracic aorta. No retroperitoneal adenopathy. IVC is patent. Reproductive: Prominent sized prostate gland. Other: No free air, or free fluid or intra-abdominal fluid collection. Fat within both inguinal canals. Musculoskeletal: Bilateral L5 pars interarticularis defects with grade 1 anterolisthesis. Degenerative disc disease at L3-L4. There are no acute or suspicious osseous abnormalities. Probable prior musculotendinous injury at the pubic symphyseal insertion. IMPRESSION: 1. Percutaneous cholecystostomy tube in place. Intraluminal gallstones including a gallstone at the gallbladder neck. Mild gallbladder wall thickening may be due to nondistention, no pericholecystic edema. 2. No additional acute abnormality in the chest, abdomen, or pelvis. 3. Additional chronic findings are unchanged. Electronically Signed   By: Jeb Levering M.D.   On: 01/13/2016 04:46    ASSESSMENT AND PLAN:    Active Problems:   Chest pain  1. Chest pain: Patient presents with right-sided chest pain that is worse with deep inspiration and with movement. His EKG showed some ST flattening in V4 through V6. This was present in EKG from March of this year. Recent cath in March of this year showed extensive CAD but no critical lesions. His troponin is negative 1. He is on Plavix and Coumadin at home. Reports taking his medications  with high compliance.  No need for further ischemic evaluation.  2. Paroxysmal atrial fibrillation: Status post cardioversion in January 2016. Also had atrial flutter ablations in 2007 and 2008. He is on Coumadin at home which is on hold, currently on heparin drip. In NSR now.   This patients CHA2DS2-VASc Score and unadjusted Ischemic Stroke Rate (% per year) is equal to 7.2 % stroke rate/year from a score of 5 Above score calculated as 1 point each if present [CHF, HTN, DM, Vascular=MI/PAD/Aortic Plaque, Age if 65-74, or Male], 2 points each if present [Age > 75, or Stroke/TIA/TE]   3. Chronic diastolic CHF: Last echo in Sept. [id number], grade 2 diastolic dysfunction. On Coreg, Lasix and spironolactone  at home. Denies SOB.   4. HTN: Well controlled on amlodipine, hydralazine and beta blocker.     Signed: Arbutus Leas, NP 01/13/2016 7:27 AM Pager [phone]  I have seen and examined the patient along with Arbutus Leas, NP.  I have reviewed the chart, notes and new data.  I agree with NP's note.  Key new complaints: his chest symptoms are decidedly nonanginal, different from previous coronary syndrome and are likely due to right phrenic nerve referred pain, quite possibly related to the gallbladder problem/biliary drain Key examination changes: no signs of hypervolemia, no arrhythmia Key new findings / data: the current ECG is not really different from the previous tracings - the precordial leads are placed differently with V4-V6 placed too medially. Enzymes are normal. Recent coronary angio and echo reviewed.  PLAN:  I think he is at low-to-moderate risk for cholecystectomy and biliary drain removal. 9 months s/p stent, his clopidogrel can be temporarily interrupted for surgery (5-7 days before surgery). Similarly, warfarin can be temporarily interrupted for biliary surgery (5-7 days before surgery). Heparin "bridging" is probably not indicated. When a date for surgery is scheduled, please  let us know so that we can coordinate the drug changes.  Sanda Klein, MD, [place] [phone] 01/13/2016, 9:55 AM

## 2016-01-13 NOTE — ED Notes (Addendum)
Presents with right sided chest pain describned as "someone standing on me with a high heel shoe" pain is constant and made worse with movement began at 3 pm yesterday. denjies SOB, nausea, vomiting, dizziness. Nothing makes pain better. Pt has a bag draining gallbladder-has cardiology appointment 9th to be cleared for surgery to have it remoeved, the pain is different from gallbladder pain.

## 2016-01-16 ENCOUNTER — Telehealth (HOSPITAL_COMMUNITY): Payer: Self-pay | Admitting: *Deleted

## 2016-01-16 ENCOUNTER — Encounter (HOSPITAL_COMMUNITY): Payer: Medicare Other

## 2016-01-16 NOTE — Telephone Encounter (Signed)
Noted that patient was in the hospital for abdominal pain.  Pt to see Dr. Aundra Dubin on Friday June 9th.  Called and left message for pt to hold on returning to rehab until seen by Dr. Aundra Dubin in the office. Cherre Huger, BSN

## 2016-01-17 ENCOUNTER — Telehealth: Payer: Self-pay | Admitting: Cardiology

## 2016-01-17 NOTE — Telephone Encounter (Signed)
Pt asking about a date for laparoscopic cholecystectomy.  Pt advised per Dr Gertha Calkin Aundra Dubin would make a decision about clearance for surgery after the patient's appt with Dr Aundra Dubin 01/20/16.

## 2016-01-17 NOTE — Telephone Encounter (Signed)
New Message:  Pt called in wanting to speak with Webb Silversmith about getting scheduled for a galbladder surgery. He is in pain and he states that some associates from our practice advised him to speak with Dr. Aundra Dubin to go ahead and get this appointment scheduled as soon as possible. Please f/u with him

## 2016-01-18 ENCOUNTER — Encounter (HOSPITAL_COMMUNITY): Admission: RE | Admit: 2016-01-18 | Payer: Medicare Other | Source: Ambulatory Visit

## 2016-01-18 ENCOUNTER — Encounter (HOSPITAL_COMMUNITY)
Admission: RE | Admit: 2016-01-18 | Discharge: 2016-01-18 | Disposition: A | Discharge status: Home / Self Care | Payer: Medicare Other | Source: Ambulatory Visit | Attending: Cardiology | Admitting: Cardiology

## 2016-01-18 DIAGNOSIS — I509 Heart failure, unspecified: Secondary | ICD-10-CM | POA: Insufficient documentation

## 2016-01-18 DIAGNOSIS — Z955 Presence of coronary angioplasty implant and graft: Secondary | ICD-10-CM | POA: Insufficient documentation

## 2016-01-18 NOTE — Progress Notes (Signed)
Incomplete Session Note  Patient Details  Name: [name] [name] MRN: [id number] Date of Birth: [birth year] Referring Provider:        CARDIAC REHAB PHASE II EXERCISE from 11/23/2015 in [place]   Referring Provider  Loralie Champagne MD      [name] [name] did not complete his rehab session.  [name] [name] has been in the hospital and advised not to exercise until we get clearance from Dr Aundra Dubin on when to resume exercise. [name] [name] reported that his the bag on his drain has been leaking. Interventional radiology called. Patient taken to radiology get a replacement bag. [name] [name] has a follow up appointment with Dr Aundra Dubin on Friday.

## 2016-01-20 ENCOUNTER — Encounter: Payer: Self-pay | Admitting: Cardiology

## 2016-01-20 ENCOUNTER — Ambulatory Visit (INDEPENDENT_AMBULATORY_CARE_PROVIDER_SITE_OTHER): Payer: Medicare Other

## 2016-01-20 ENCOUNTER — Encounter (HOSPITAL_COMMUNITY): Payer: Medicare Other

## 2016-01-20 ENCOUNTER — Ambulatory Visit (INDEPENDENT_AMBULATORY_CARE_PROVIDER_SITE_OTHER): Payer: Medicare Other | Admitting: Cardiology

## 2016-01-20 VITALS — BP 130/74 | HR 72 | Ht 72.0 in | Wt 215.2 lb

## 2016-01-20 DIAGNOSIS — I25119 Atherosclerotic heart disease of native coronary artery with unspecified angina pectoris: Secondary | ICD-10-CM | POA: Diagnosis not present

## 2016-01-20 DIAGNOSIS — K810 Acute cholecystitis: Secondary | ICD-10-CM

## 2016-01-20 DIAGNOSIS — Z7901 Long term (current) use of anticoagulants: Secondary | ICD-10-CM

## 2016-01-20 DIAGNOSIS — I209 Angina pectoris, unspecified: Secondary | ICD-10-CM | POA: Diagnosis not present

## 2016-01-20 DIAGNOSIS — I4891 Unspecified atrial fibrillation: Secondary | ICD-10-CM | POA: Diagnosis not present

## 2016-01-20 DIAGNOSIS — I2510 Atherosclerotic heart disease of native coronary artery without angina pectoris: Secondary | ICD-10-CM | POA: Diagnosis not present

## 2016-01-20 DIAGNOSIS — Z5181 Encounter for therapeutic drug level monitoring: Secondary | ICD-10-CM | POA: Diagnosis not present

## 2016-01-20 DIAGNOSIS — I480 Paroxysmal atrial fibrillation: Secondary | ICD-10-CM | POA: Diagnosis not present

## 2016-01-20 LAB — BASIC METABOLIC PANEL
BUN: 14 mg/dL (ref 7–25)
CALCIUM: 9.2 mg/dL (ref 8.6–10.3)
CO2: 23 mmol/L (ref 20–31)
Chloride: 103 mmol/L (ref 98–110)
Creat: 1.16 mg/dL (ref 0.70–1.25)
GLUCOSE: 126 mg/dL — AB (ref 65–99)
POTASSIUM: 3.7 mmol/L (ref 3.5–5.3)
SODIUM: 137 mmol/L (ref 135–146)

## 2016-01-20 LAB — POCT INR: INR: 1.3

## 2016-01-20 NOTE — Patient Instructions (Signed)
Medication Instructions:  Your physician recommends that you continue on your current medications as directed. Please refer to the Current Medication list given to you today.   Labwork: BMET today  Testing/Procedures: None   Follow-Up: Your physician recommends that you schedule a follow-up appointment in: 3 months with Dr Aundra Dubin.   Any Other Special Instructions Will Be Listed Below (If Applicable).  Dr Aundra Dubin has cleared you for gall bladder surgery and will send a note to Dr Redmond Pulling.   If you need a refill on your cardiac medications before your next appointment, please call your pharmacy.

## 2016-01-22 DIAGNOSIS — I480 Paroxysmal atrial fibrillation: Secondary | ICD-10-CM | POA: Insufficient documentation

## 2016-01-22 DIAGNOSIS — I4891 Unspecified atrial fibrillation: Secondary | ICD-10-CM | POA: Insufficient documentation

## 2016-01-22 NOTE — Progress Notes (Signed)
Patient ID: [name] [name], male   DOB: 28-Nov-1955, 60 y.o.   MRN: [id number] PCP: Dr. Doreene Burke  60 yo with history of HTN, diabetes, paroxysmal atrial fibrillation, and CAD presents for cardiology followup.  He was admitted in 5/13 with chest pain with exertion.  LHC was done showing global HK with EF 45% and diffuse, severe distal and branch vessel disease.  There was no interventional option, but I suspect that this disease could be causing his anginal-type pain.  Echo at that time was read as showing EF 60-65%.  He was admitted in 5/14 with hypertensive urgency and chest pain.  He ruled out for MI and BP was controlled.  His EF was 50-55% by echo.  ETT-Sestamibi done as outpatient showed small fixed inferior defect with no ischemia but EF was 31%.  He was quite hypertensive during the study.  Cardiac MRI done to confirm EF in 5/14 showed EF 44%.  He had an episode of atrial fibrillation/RVR in 3/15 but went back into NSR.  Echo in 2/16 showed EF 55-60% with moderate diastolic dysfunction.   He had a Cardiolite in 9/16 showing inferior ischemia.  He was having exertional dyspnea with less activity than normal.  I was concerned for worsened CAD, so took him for [facility] in 9/16. By RHC, filling pressures were not significantly elevated. He had a severe distal RCA stenosis that was treated with DES x 2.  Echo showed EF 55-60%.    In 10/16, he was admitted with acute cholecystitis.  He received a cholecystostomy tube.  Still has cholecystostomy tube.   Atrial fibrillation in 1/17, cardioverted to NSR.   Recurrent concerning chest pain in 3/17.  Repeat LHC showed diffuse distal and branch vessel disease consistent with poorly-controlled DM, but patent RCA stents.    Today, he remains in NSR. He is going to cardiac rehab.  No chest pain.  Follows with endocrinologist to try to control his blood glucose.  No palpitations.  No exertional dyspnea.  Has seen general surgery, awaiting cardiac clearance for  cholecystectomy.  Labs (5/13): K 3.6, creatinine 0.93, LDL 85, HDL 57 Labs (7/13): K 3.9, creatinine 1.1, LDL 57, HDL 51  Labs (5/14): K 4, creatinine 1.25 Labs (8/14): K 3.7, creatinine 0.9, LDL 103, HDL 42 Labs (8/15): K 3.5 creatinine 1.0, BNP 75 Labs (2/16): K 3.8, creatinine 1.1 Labs (9/16): K 3.5, creatinine 1.03, BNP 59, HDL 40, LDL 111, TGs 333 Labs (11/16): K 3.6, creaitnine 1.07, LFTs normal, LDL 44 Labs (6/17): K 3.4, creatinine 1.52  ECG: NSR, inferior and anterolateral T wave inversions.   PMH: 1. DM2 2. Gout 3. HTN: Cough with ACEI.  4. Cardiomyopathy: Suspect mixed ischemic/nonischemic (?ETOH-related).  EF 35% in 2008.  Echo in 5/13 showed EF 60-65% with moderate LVH but EF was 45% on LV-gram in 5/13.  Echo (5/14) with EF 50-55%, mild LVH, inferobasal HK, mild MR.  ETT-Sestamibi in 5/14, however, showed EF 31%.  Cardiac MRI (6/14) with EF 44%, mild global hypokinesis, subepicardial delayed enhancement in a nonspecific RV insertion site pattern.  Echo (2/16) with EF 55-60%, moderate diastolic dysfunction. RHC (9/16) with mean RA 8, PA 39/20 mean 28, mean PCWP 14, CI 2.2. Echo (9/16) with EF 55-60%, grade II diastolic dysfunction.  - Echo (6/17) with EF 55-60%, moderate LVH, grade II diastolic dysfunction, mild AI, mild MR.  5. CAD: LHC in 2010 with mild nonobstructive disease.  LHC (5/13) with diffuse distal and branch vessel disease, mild global hypokinesis and EF  45%.  ETT-Sestamibi (5/14): EF 31%, small fixed inferior defect with no ischemia.  Lexiscan Cardiolite (9/16) with EF 26%, moderate inferior defect that was partially reversible, suggesting ischemia => High risk study. LHC (9/16) with 40-50% mLAD, diffuse up to 50% distal LAD, 90% ostium of branch off ramus, 95% dRCA => DES to RCA x 2, EF 55-60%.   - LHC (3/17) with diffuse branch and distal vessel disease c/w poorly controlled DM, RCA stents patent.  6. H/o chronic HBV 7. Osteochondroma left shoulder.  8.  Hyperlipidemia 9. Paroxysmal atrial fibrillation: Coumadin.  Developed cough and increased ESR with amiodarone.  DCCV in 1/17.  10. Atrial flutter: had ablations in 2007 and 2008.  11. OSA on CPAP.  12. Acute cholecystitis (10/16): Cholecystostomy tube placed.  13. CKD  SH: Married, prior smoker.  Some ETOH, occasionally heavy in past.  Does use occasional marijuana. Out of work Engineer, drilling. 2 daughters in grad school.   FH: CAD  ROS: All systems reviewed and negative except as per HPI.   Current Outpatient Prescriptions  Medication Sig Dispense Refill  . acetaminophen (TYLENOL) 500 MG tablet Take 1,000 mg by mouth every 6 (six) hours as needed (pain). Reported on 08/18/2015    . amLODipine (NORVASC) 5 MG tablet Take 1 tablet (5 mg total) by mouth daily. 90 tablet 3  . atorvastatin (LIPITOR) 80 MG tablet Take 1 tablet (80 mg total) by mouth daily. 30 tablet 1  . carvedilol (COREG) 12.5 MG tablet Take 1 tablet (12.5 mg total) by mouth 2 (two) times daily with a meal. 180 tablet 3  . clopidogrel (PLAVIX) 75 MG tablet Take 1 tablet (75 mg total) by mouth daily with breakfast. 90 tablet 3  . furosemide (LASIX) 40 MG tablet TAKE ONE TABLET BY MOUTH IN THE MORNING AND ONE-HALF TABLET IN THE EVENING 90 tablet 3  . hydrALAZINE (APRESOLINE) 50 MG tablet Take 1 tablet (50 mg total) by mouth 3 (three) times daily. 270 tablet 3  . Insulin Glargine (LANTUS SOLOSTAR) 100 UNIT/ML Solostar Pen Inject 50 Units into the skin every morning. And pen needles 1/day 30 mL 3  . metFORMIN (GLUCOPHAGE) 1000 MG tablet Take 1 tablet (1,000 mg total) by mouth 2 (two) times daily with a meal. 180 tablet 3  . nitroGLYCERIN (NITROSTAT) 0.4 MG SL tablet Place 0.4 mg under the tongue every 5 (five) minutes as needed for chest pain (x 3 tabs daily). Reported on 08/18/2015    . oxyCODONE-acetaminophen (ROXICET) 5-325 MG tablet Take 1 tablet by mouth every 4 (four) hours as needed for severe pain. 30 tablet 0  . potassium chloride  SA (K-DUR,KLOR-CON) 20 MEQ tablet Take 1 tablet (20 mEq total) by mouth daily. 90 tablet 3  . spironolactone (ALDACTONE) 25 MG tablet Take 1 tablet (25 mg total) by mouth daily. 90 tablet 3  . warfarin (COUMADIN) 5 MG tablet Take as directed by coumadin clinic (Patient taking differently: Take 2.5-5 mg by mouth daily. Take 2.100m by mouth on Tuesday and Sunday. Take 5 mg by mouth on all other days) 90 tablet 1   No current facility-administered medications for this visit.    BP 130/74 mmHg  Pulse 72  Ht 6' (1.829 m)  Wt 215 lb 4 oz (97.637 kg)  BMI 29.19 kg/m2 General: NAD Neck: JVP 7 cm, no thyromegaly or thyroid nodule.  Lungs: Clear to auscultation bilaterally with normal respiratory effort. CV: Nondisplaced PMI.  Heart regular S1/S2, no S3/S4, no murmur.  No peripheral edema.  No carotid bruit.  Normal pedal pulses.  Abdomen: Soft, nontender, no hepatosplenomegaly, no distention.  Neurologic: Alert and oriented x 3.  Psych: Normal affect. Extremities: No clubbing or cyanosis.   Assessment/Plan:  Atrial fibrillation Paroxysmal atrial fibrillation, continue coumadin. Goal INR will be 2-2.5 while he is on Plavix also.  He is in NSR today.  Coronary artery disease LHC in 9/16 showed diffuse CAD, most significant involving the distal RCA.  Patient had DES x 2 to RCA.  Repeat cath in 3/17 with patent RCA stents and diffuse distal and branch vessel disease concerning for poorly controlled diabetes. - He is currently on Plavix + warfarin with plan to continue Plavix x 1 year then stop it. March was 6 months post-stents (2nd generation DES).  He needs cholecystectomy at this point.  As he is > 6 months post-PCI, I will let him hold warfarin and Plavix for CCY, but he will need to restart as soon as possible afterwards. - Continue statin.  HYPERCHOLESTEROLEMIA  Good lipids in 11/16. Cardiomyopathy  EF 47% on cardiac MRI in 6/14 but EF improved back to 55-60% on 2/16 echo.  However, EF down  to 26% on Cardiolite in 9/16.  I think this was inaccurate.  EF was 55-60% by both LV-gram and echo in 9/16 and 55-60% on 6/17 echo.  He looks euvolemic on exam.  - He is no longer taking Lasix.  I think he can stay off Lasix.  Hypertension BP controlled on current regimen. OSA Continue CPAP.  Cholecystitis He now has a cholecystostomy tube in.  He will need cholecystectomy.  With new generation DES placed, could come off Plavix for necessary surgery at 6 months, which was the end of March.  As above, I think it is reasonable to hold Plavix and warfarin for CCY at this point, start as soon as possible after surgery.  Continue Coreg peri-operatively. CKD BMET today.   Followup in 3 months.   Loralie Champagne 01/22/2016

## 2016-01-23 ENCOUNTER — Encounter (HOSPITAL_COMMUNITY): Admission: RE | Admit: 2016-01-23 | Payer: Medicare Other | Source: Ambulatory Visit

## 2016-01-23 ENCOUNTER — Telehealth (HOSPITAL_COMMUNITY): Payer: Self-pay | Admitting: *Deleted

## 2016-01-23 NOTE — Telephone Encounter (Signed)
-----   Message from Larey Dresser, MD sent at 01/21/2016 11:19 PM EDT ----- Regarding: RE: ok to return back to rehab from cardiologist  yes ----- Message -----    From: Rowe Pavy, RN    Sent: 01/21/2016   3:57 PM      To: Larey Dresser, MD Subject: ok to return back to rehab from cardiologist   Dr. Aundra Dubin,  [place] seen by you on Friday for clearance for gallbladder surgery after ER visit.  Ok from cardiology standpoint for pt to return to exercise at cardiac rehab in the interim? Has a couple of weeks left to attend per [facility] reimbursement.  Thanks for the input  [facility] RN

## 2016-01-25 ENCOUNTER — Encounter (HOSPITAL_COMMUNITY): Admission: RE | Admit: 2016-01-25 | Payer: Medicare Other | Source: Ambulatory Visit

## 2016-01-26 ENCOUNTER — Encounter: Payer: Self-pay | Admitting: Internal Medicine

## 2016-01-26 ENCOUNTER — Ambulatory Visit: Payer: Medicare Other | Attending: Internal Medicine | Admitting: Internal Medicine

## 2016-01-26 VITALS — BP 133/79 | HR 87 | Temp 98.2°F | Resp 16 | Ht 72.0 in | Wt 213.4 lb

## 2016-01-26 DIAGNOSIS — E119 Type 2 diabetes mellitus without complications: Secondary | ICD-10-CM | POA: Diagnosis not present

## 2016-01-26 DIAGNOSIS — I25119 Atherosclerotic heart disease of native coronary artery with unspecified angina pectoris: Secondary | ICD-10-CM | POA: Diagnosis not present

## 2016-01-26 DIAGNOSIS — Z794 Long term (current) use of insulin: Secondary | ICD-10-CM

## 2016-01-26 DIAGNOSIS — I10 Essential (primary) hypertension: Secondary | ICD-10-CM | POA: Diagnosis not present

## 2016-01-26 DIAGNOSIS — K8010 Calculus of gallbladder with chronic cholecystitis without obstruction: Secondary | ICD-10-CM

## 2016-01-26 DIAGNOSIS — E785 Hyperlipidemia, unspecified: Secondary | ICD-10-CM

## 2016-01-26 LAB — GLUCOSE, POCT (MANUAL RESULT ENTRY): POC GLUCOSE: 238 mg/dL — AB (ref 70–99)

## 2016-01-26 LAB — POCT GLYCOSYLATED HEMOGLOBIN (HGB A1C): Hemoglobin A1C: 7.6

## 2016-01-26 MED ORDER — INSULIN GLARGINE 100 UNIT/ML SOLOSTAR PEN: 50.0000 [IU] | PEN_INJECTOR | SUBCUTANEOUS | Status: DC

## 2016-01-26 MED FILL — !LANTUS SOLOSTAR 100UNITS/M: 100 | 30 days supply | Qty: 15 | Fill #0

## 2016-01-26 NOTE — Progress Notes (Signed)
Patient ID: [name] [name], male   DOB: Nov 17, 1955, 60 y.o.   MRN: [id number]   [name] [name], is a 60 y.o. male  [id number]  [id number]  DOB - 05-Apr-1956  Chief Complaint  Patient presents with  . Follow-up    DM and HTN        Subjective:   [name] [name] is a 60 y.o. male with history of HTN, HLD, DM, PAF on Coumadin, CAD, h/o cholelithiasis/cholecystitis with biliary drain placed in October 2016, and h/o Osteosarcoma here today for a follow up visit for DM and HTN. Pt CBG is 238 and A1C is 7.6% today. He is complaining of ongoing pain in upper right side of abdomen rated at a 2, he is being scheduled for elective cholecystectomy since he can now hold Plavix and warfarin for a few days preoperatively according to the cardiologist. He has had biliary drain for some months now, waiting for 6 months post PCI so that the anticoagulation can be held for surgery. Patient is currently having financial difficulties and has not been able to afford his insulin copay, requesting help with medication assistance. He also needs refills on some of his medications. He follows up regularly with cardiologist for his extensive cardiac history. Patient has No headache, No chest pain, No new weakness tingling or numbness, No Cough - SOB.  No problems updated.  ALLERGIES: Allergies  Allergen Reactions  . Ace Inhibitors Cough  . Other Hives and Other (See Comments)    Patient reports developing hives after receiving "some antibiotic given in 1980''s at [facility]". He does not know which antibiotic.    PAST MEDICAL HISTORY: Past Medical History  Diagnosis Date  . Hypertension   . Gout   . PAF (paroxysmal atrial fibrillation) (HCC)     On coumadin  . LIVER FUNCTION TESTS, ABNORMAL, HX OF 12/30/2006  . Rectal bleeding 12/18/2011    Scheduled for colonoscopy.    [name] HEPATITIS B, CHRONIC 12/30/2006  . HYPERCHOLESTEROLEMIA 07/11/2010  . MITRAL REGURGITATION 12/30/2006  . COLONIC POLYPS,  HX OF 12/30/2006  . CAD (coronary artery disease), native coronary artery     a. Nonobstructive CAD by cath 2013 - diffuse distal and branch vessel CAD, no severe disease in the major coronaries, LV mild global hypokinesis, EF 45%. b. ETT-Sestamibi 5/14: EF 31%, small fixed inferior defect with no ischemia.  . Chronic CHF ([place])     a. Mixed ICM/NICM (?EtOH). EF 35% in 2008. Echo 5/13: EF 60-65%, mod LVH, EF 45% on V gram in 12/2011. EF 12/2012: EF 50-55%, mild LVH, inferobasal HK, mild MR. ETT-Ses 5/14 EF 41%. Cardiac MRI 5/14: EF 44%, mild global HK, subepicardial delayed enhancement in nonspecific RV insertion pattern.  . H/O atrial flutter 2007    a. Ablations in 2007, 2008.  [name] History of medication noncompliance   . History of alcohol abuse   . 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 Heart murmur   . Anginal pain ([place])   . Sleep apnea     "suppose to send mask but they never did" (05/03/2015)  . Type II diabetes mellitus ([place])   . Left sciatic nerve pain since 04/2015  . Anxiety   . Cholecystitis   . Arrhythmia   . History of nuclear stress test     Myoview 3/17: + chest pain; EF 33%, downsloping ST depression 2, V4-6, LVH with strain, no ischemia on images; intermediate risk     MEDICATIONS AT HOME: Prior to  Admission medications   Medication Sig Start Date End Date Taking? Authorizing Provider  acetaminophen (TYLENOL) 500 MG tablet Take 1,000 mg by mouth every 6 (six) hours as needed (pain). Reported on 08/18/2015   Yes Historical Provider, MD  amLODipine (NORVASC) 5 MG tablet Take 1 tablet (5 mg total) by mouth daily. 12/29/15  Yes Tresa Garter, MD  atorvastatin (LIPITOR) 80 MG tablet Take 1 tablet (80 mg total) by mouth daily. 01/13/16  Yes Mir Marry Guan, MD  carvedilol (COREG) 12.5 MG tablet Take 1 tablet (12.5 mg total) by mouth 2 (two) times daily with a meal. 12/29/15  Yes Tresa Garter, MD  clopidogrel (PLAVIX) 75 MG tablet Take 1 tablet (75 mg  total) by mouth daily with breakfast. 12/29/15  Yes Hasna Stefanik E Doreene Burke, MD  furosemide (LASIX) 40 MG tablet TAKE ONE TABLET BY MOUTH IN THE MORNING AND ONE-HALF TABLET IN THE EVENING 12/29/15  Yes Tresa Garter, MD  hydrALAZINE (APRESOLINE) 50 MG tablet Take 1 tablet (50 mg total) by mouth 3 (three) times daily. 12/29/15  Yes Tresa Garter, MD  Insulin Glargine (LANTUS SOLOSTAR) 100 UNIT/ML Solostar Pen Inject 50 Units into the skin every morning. And pen needles 1/day 01/26/16  Yes Tresa Garter, MD  metFORMIN (GLUCOPHAGE) 1000 MG tablet Take 1 tablet (1,000 mg total) by mouth 2 (two) times daily with a meal. 12/29/15  Yes Tresa Garter, MD  nitroGLYCERIN (NITROSTAT) 0.4 MG SL tablet Place 0.4 mg under the tongue every 5 (five) minutes as needed for chest pain (x 3 tabs daily). Reported on 08/18/2015   Yes Historical Provider, MD  potassium chloride SA (K-DUR,KLOR-CON) 20 MEQ tablet Take 1 tablet (20 mEq total) by mouth daily. 12/29/15  Yes Tresa Garter, MD  spironolactone (ALDACTONE) 25 MG tablet Take 1 tablet (25 mg total) by mouth daily. 12/29/15  Yes Tresa Garter, MD  warfarin (COUMADIN) 5 MG tablet Take as directed by coumadin clinic Patient taking differently: Take 2.5-5 mg by mouth daily. Take 2.5mg  by mouth on Tuesday and Sunday. Take 5 mg by mouth on all other days 09/01/15  Yes Larey Dresser, MD  oxyCODONE-acetaminophen (ROXICET) 5-325 MG tablet Take 1 tablet by mouth every 4 (four) hours as needed for severe pain. Patient not taking: Reported on 01/26/2016 01/13/16   Mir Marry Guan, MD     Objective:   Filed Vitals:   01/26/16 1142  BP: 133/79  Pulse: 87  Temp: 98.2 F (36.8 C)  TempSrc: Oral  Resp: 16  Height: 6' (1.829 m)  Weight: 213 lb 6.4 oz (96.798 kg)  SpO2: 95%    Exam General appearance : Awake, alert, not in any distress. Speech Clear. Not toxic looking HEENT: Atraumatic and Normocephalic, pupils equally reactive to light and  accomodation Neck: Supple, no JVD. No cervical lymphadenopathy.  Chest: Good air entry bilaterally, no added sounds  CVS: S1 S2 regular, no murmurs.  Abdomen: Bowel sounds present, Non tender and not distended with no gaurding, rigidity or rebound. Extremities: B/L Lower Ext shows no edema, both legs are warm to touch Neurology: Awake alert, and oriented X 3, CN II-XII intact, Non focal Skin: No Rash  Data Review Lab Results  Component Value Date   HGBA1C 8.0 11/10/2015   HGBA1C 6.8 08/04/2015   HGBA1C 8.5 05/09/2015     Assessment & Plan   1. Type 2 diabetes mellitus without complication, with long-term current use of insulin (HCC)  - POCT A1C -  Glucose (CBG) - Microalbumin/Creatinine Ratio, Urine  - Insulin Glargine (LANTUS SOLOSTAR) 100 UNIT/ML Solostar Pen; Inject 50 Units into the skin every morning. And pen needles 1/day  Dispense: 30 mL; Refill: 3 Aim for 30 minutes of exercise most days. Rethink what you drink. Water is great! Aim for 2-3 Carb Choices per meal (30-45 grams) +/- 1 either way  Aim for 0-15 Carbs per snack if hungry  Include protein in moderation with your meals and snacks  Consider reading food labels for Total Carbohydrate and Fat Grams of foods  Consider checking BG at alternate times per day  Continue taking medication as directed Be mindful about how much sugar you are adding to beverages and other foods. Fruit Punch - find one with no sugar  Measure and decrease portions of carbohydrate foods  Make your plate and don't go back for seconds   2. Essential hypertension  We have discussed target BP range and blood pressure goal. I have advised patient to check BP regularly and to call us back or report to clinic if the numbers are consistently higher than 140/90. We discussed the importance of compliance with medical therapy and DASH diet recommended, consequences of uncontrolled hypertension discussed.  - continue current BP medications  3.  Dyslipidemia  To address this please limit saturated fat to no more than 7% of your calories, limit cholesterol to 200 mg/day, increase fiber and exercise as tolerated. If needed we may add another cholesterol lowering medication to your regimen.   4. Calculus of gallbladder with chronic cholecystitis without obstruction  Awaiting surgery on 02/07/2016 Ok to hold anticoagulation for 5 days Pre-op, restart as soon as possible post-op  Patient have been counseled extensively about nutrition and exercise  Return in about 3 months (around 04/27/2016) for Hemoglobin A1C and Follow up, DM, Follow up HTN, Follow up Pain and comorbidities.  The patient was given clear instructions to go to ER or return to medical center if symptoms don't improve, worsen or new problems develop. The patient verbalized understanding. The patient was told to call to get lab results if they haven't heard anything in the next week.   This note has been created with Surveyor, quantity. Any transcriptional errors are unintentional.    Angelica Chessman, MD, [place], [place], [place], [place] and [place], [place]   01/26/2016, 12:15 PM

## 2016-01-26 NOTE — Patient Instructions (Addendum)
Basic Carbohydrate Counting for Diabetes Mellitus Carbohydrate counting is a method for keeping track of the amount of carbohydrates you eat. Eating carbohydrates naturally increases the level of sugar (glucose) in your blood, so it is important for you to know the amount that is okay for you to have in every meal. Carbohydrate counting helps keep the level of glucose in your blood within normal limits. The amount of carbohydrates allowed is different for every person. A dietitian can help you calculate the amount that is right for you. Once you know the amount of carbohydrates you can have, you can count the carbohydrates in the foods you want to eat. Carbohydrates are found in the following foods:  Grains, such as breads and cereals.  Dried beans and soy products.  Starchy vegetables, such as potatoes, peas, and corn.  Fruit and fruit juices.  Milk and yogurt.  Sweets and snack foods, such as cake, cookies, candy, chips, soft drinks, and fruit drinks. CARBOHYDRATE COUNTING There are two ways to count the carbohydrates in your food. You can use either of the methods or a combination of both. Reading the "Nutrition Facts" on Packaged Food The "Nutrition Facts" is an area that is included on the labels of almost all packaged food and beverages in the [place]. It includes the serving size of that food or beverage and information about the nutrients in each serving of the food, including the grams (g) of carbohydrate per serving.  Decide the number of servings of this food or beverage that you will be able to eat or drink. Multiply that number of servings by the number of grams of carbohydrate that is listed on the label for that serving. The total will be the amount of carbohydrates you will be having when you eat or drink this food or beverage. Learning Standard Serving Sizes of Food When you eat food that is not packaged or does not include "Nutrition Facts" on the label, you need to  measure the servings in order to count the amount of carbohydrates.A serving of most carbohydrate-rich foods contains about 15 g of carbohydrates. The following list includes serving sizes of carbohydrate-rich foods that provide 15 g ofcarbohydrate per serving:   1 slice of bread (1 oz) or 1 six-inch tortilla.    of a hamburger bun or English muffin.  4-6 crackers.   cup unsweetened dry cereal.    cup hot cereal.   cup rice or pasta.    cup mashed potatoes or  of a large baked potato.  1 cup fresh fruit or one small piece of fruit.    cup canned or frozen fruit or fruit juice.  1 cup milk.   cup plain fat-free yogurt or yogurt sweetened with artificial sweeteners.   cup cooked dried beans or starchy vegetable, such as peas, corn, or potatoes.  Decide the number of standard-size servings that you will eat. Multiply that number of servings by 15 (the grams of carbohydrates in that serving). For example, if you eat 2 cups of strawberries, you will have eaten 2 servings and 30 g of carbohydrates (2 servings x 15 g = 30 g). For foods such as soups and casseroles, in which more than one food is mixed in, you will need to count the carbohydrates in each food that is included. EXAMPLE OF CARBOHYDRATE COUNTING Sample Dinner  3 oz chicken breast.   cup of brown rice.   cup of corn.  1 cup milk.   1 cup strawberries with   sugar-free whipped topping.  Carbohydrate Calculation Step 1: Identify the foods that contain carbohydrates:   Rice.   Corn.   Milk.   Strawberries. Step 2:Calculate the number of servings eaten of each:   2 servings of rice.   1 serving of corn.   1 serving of milk.   1 serving of strawberries. Step 3: Multiply each of those number of servings by 15 g:   2 servings of rice x 15 g = 30 g.   1 serving of corn x 15 g = 15 g.   1 serving of milk x 15 g = 15 g.   1 serving of strawberries x 15 g = 15 g. Step 4: Add  together all of the amounts to find the total grams of carbohydrates eaten: 30 g + 15 g + 15 g + 15 g = 75 g.   This information is not intended to replace advice given to you by your health care provider. Make sure you discuss any questions you have with your health care provider.   Document Released: 07/30/2005 Document Revised: 08/20/2014 Document Reviewed: 06/26/2013 Elsevier Interactive Patient Education 2016 [employer]. Hypertension Hypertension, commonly called high blood pressure, is when the force of blood pumping through your arteries is too strong. Your arteries are the blood vessels that carry blood from your heart throughout your body. A blood pressure reading consists of a higher number over a lower number, such as 110/72. The higher number (systolic) is the pressure inside your arteries when your heart pumps. The lower number (diastolic) is the pressure inside your arteries when your heart relaxes. Ideally you want your blood pressure below 120/80. Hypertension forces your heart to work harder to pump blood. Your arteries may become narrow or stiff. Having untreated or uncontrolled hypertension can cause heart attack, stroke, kidney disease, and other problems. RISK FACTORS Some risk factors for high blood pressure are controllable. Others are not.  Risk factors you cannot control include:   Race. You may be at higher risk if you are African American.  Age. Risk increases with age.  Gender. Men are at higher risk than women before age 78 years. After age 49, women are at higher risk than men. Risk factors you can control include:  Not getting enough exercise or physical activity.  Being overweight.  Getting too much fat, sugar, calories, or salt in your diet.  Drinking too much alcohol. SIGNS AND SYMPTOMS Hypertension does not usually cause signs or symptoms. Extremely high blood pressure (hypertensive crisis) may cause headache, anxiety, shortness of breath, and  nosebleed. DIAGNOSIS To check if you have hypertension, your health care provider will measure your blood pressure while you are seated, with your arm held at the level of your heart. It should be measured at least twice using the same arm. Certain conditions can cause a difference in blood pressure between your right and left arms. A blood pressure reading that is higher than normal on one occasion does not mean that you need treatment. If it is not clear whether you have high blood pressure, you may be asked to return on a different day to have your blood pressure checked again. Or, you may be asked to monitor your blood pressure at home for 1 or more weeks. TREATMENT Treating high blood pressure includes making lifestyle changes and possibly taking medicine. Living a healthy lifestyle can help lower high blood pressure. You may need to change some of your habits. Lifestyle changes may include:  Following the DASH diet. This diet is high in fruits, vegetables, and whole grains. It is low in salt, red meat, and added sugars.  Keep your sodium intake below 2,300 mg per day.  Getting at least 30-45 minutes of aerobic exercise at least 4 times per week.  Losing weight if necessary.  Not smoking.  Limiting alcoholic beverages.  Learning ways to reduce stress. Your health care provider may prescribe medicine if lifestyle changes are not enough to get your blood pressure under control, and if one of the following is true:  You are 96-65 years of age and your systolic blood pressure is above 140.  You are 32 years of age or older, and your systolic blood pressure is above 150.  Your diastolic blood pressure is above 90.  You have diabetes, and your systolic blood pressure is over [id number] or your diastolic blood pressure is over 90.  You have kidney disease and your blood pressure is above 140/90.  You have heart disease and your blood pressure is above 140/90. Your personal target blood  pressure may vary depending on your medical conditions, your age, and other factors. HOME CARE INSTRUCTIONS  Have your blood pressure rechecked as directed by your health care provider.   Take medicines only as directed by your health care provider. Follow the directions carefully. Blood pressure medicines must be taken as prescribed. The medicine does not work as well when you skip doses. Skipping doses also puts you at risk for problems.  Do not smoke.   Monitor your blood pressure at home as directed by your health care provider. SEEK MEDICAL CARE IF:   You think you are having a reaction to medicines taken.  You have recurrent headaches or feel dizzy.  You have swelling in your ankles.  You have trouble with your vision. SEEK IMMEDIATE MEDICAL CARE IF:  You develop a severe headache or confusion.  You have unusual weakness, numbness, or feel faint.  You have severe chest or abdominal pain.  You vomit repeatedly.  You have trouble breathing. MAKE SURE YOU:   Understand these instructions.  Will watch your condition.  Will get help right away if you are not doing well or get worse.   This information is not intended to replace advice given to you by your health care provider. Make sure you discuss any questions you have with your health care provider.   Document Released: 07/30/2005 Document Revised: 12/14/2014 Document Reviewed: 05/22/2013 Elsevier Interactive Patient Education 2016 [place] DASH stands for "Dietary Approaches to Stop Hypertension." The DASH eating plan is a healthy eating plan that has been shown to reduce high blood pressure (hypertension). Additional health benefits may include reducing the risk of type 2 diabetes mellitus, heart disease, and stroke. The DASH eating plan may also help with weight loss. WHAT DO I NEED TO KNOW ABOUT THE DASH EATING PLAN? For the DASH eating plan, you will follow these general  guidelines:  Choose foods with a percent daily value for sodium of less than 5% (as listed on the food label).  Use salt-free seasonings or herbs instead of table salt or sea salt.  Check with your health care provider or pharmacist before using salt substitutes.  Eat lower-sodium products, often labeled as "lower sodium" or "no salt added."  Eat fresh foods.  Eat more vegetables, fruits, and low-fat dairy products.  Choose whole grains. Look for the word "whole" as the first word in the ingredient list.  Choose fish and skinless chicken or [place] more often than red meat. Limit fish, poultry, and meat to 6 oz (170 g) each day.  Limit sweets, desserts, sugars, and sugary drinks.  Choose heart-healthy fats.  Limit cheese to 1 oz (28 g) per day.  Eat more home-cooked food and less restaurant, buffet, and fast food.  Limit fried foods.  Cook foods using methods other than frying.  Limit canned vegetables. If you do use them, rinse them well to decrease the sodium.  When eating at a restaurant, ask that your food be prepared with less salt, or no salt if possible. WHAT FOODS CAN I EAT? Seek help from a dietitian for individual calorie needs. Grains Whole grain or whole wheat bread. Brown rice. Whole grain or whole wheat pasta. Quinoa, bulgur, and whole grain cereals. Low-sodium cereals. Corn or whole wheat flour tortillas. Whole grain cornbread. Whole grain crackers. Low-sodium crackers. Vegetables Fresh or frozen vegetables (raw, steamed, roasted, or grilled). Low-sodium or reduced-sodium tomato and vegetable juices. Low-sodium or reduced-sodium tomato sauce and paste. Low-sodium or reduced-sodium canned vegetables.  Fruits All fresh, canned (in natural juice), or frozen fruits. Meat and Other Protein Products Ground beef (85% or leaner), grass-fed beef, or beef trimmed of fat. Skinless chicken or [place]. Ground chicken or [place]. Pork trimmed of fat. All fish and seafood.  Eggs. Dried beans, peas, or lentils. Unsalted nuts and seeds. Unsalted canned beans. Dairy Low-fat dairy products, such as skim or 1% milk, 2% or reduced-fat cheeses, low-fat ricotta or cottage cheese, or plain low-fat yogurt. Low-sodium or reduced-sodium cheeses. Fats and Oils Tub margarines without trans fats. Light or reduced-fat mayonnaise and salad dressings (reduced sodium). Avocado. Safflower, olive, or canola oils. Natural peanut or almond butter. Other Unsalted popcorn and pretzels. The items listed above may not be a complete list of recommended foods or beverages. Contact your dietitian for more options. WHAT FOODS ARE NOT RECOMMENDED? Grains White bread. White pasta. White rice. Refined cornbread. Bagels and croissants. Crackers that contain trans fat. Vegetables Creamed or fried vegetables. Vegetables in a cheese sauce. Regular canned vegetables. Regular canned tomato sauce and paste. Regular tomato and vegetable juices. Fruits Dried fruits. Canned fruit in light or heavy syrup. Fruit juice. Meat and Other Protein Products Fatty cuts of meat. Ribs, chicken wings, bacon, sausage, bologna, salami, chitterlings, fatback, hot dogs, bratwurst, and packaged luncheon meats. Salted nuts and seeds. Canned beans with salt. Dairy Whole or 2% milk, cream, half-and-half, and cream cheese. Whole-fat or sweetened yogurt. Full-fat cheeses or blue cheese. Nondairy creamers and whipped toppings. Processed cheese, cheese spreads, or cheese curds. Condiments Onion and garlic salt, seasoned salt, table salt, and sea salt. Canned and packaged gravies. Worcestershire sauce. Tartar sauce. Barbecue sauce. Teriyaki sauce. Soy sauce, including reduced sodium. Steak sauce. Fish sauce. Oyster sauce. Cocktail sauce. Horseradish. Ketchup and mustard. Meat flavorings and tenderizers. Bouillon cubes. Hot sauce. Tabasco sauce. Marinades. Taco seasonings. Relishes. Fats and Oils Butter, stick margarine, lard,  shortening, ghee, and bacon fat. Coconut, palm kernel, or palm oils. Regular salad dressings. Other Pickles and olives. Salted popcorn and pretzels. The items listed above may not be a complete list of foods and beverages to avoid. Contact your dietitian for more information. WHERE CAN I FIND MORE INFORMATION? National [employer], [employer], [employer]: travelstabloid.com   This information is not intended to replace advice given to you by your health care provider. Make sure you discuss any questions you have with your health care provider.  Document Released: 07/19/2011 Document Revised: 08/20/2014 Document Reviewed: 06/03/2013 Elsevier Interactive Patient Education [employer].

## 2016-01-26 NOTE — Progress Notes (Signed)
Pt here for F/U for DM and HTN. Pt CBG is 238 and A1C is 7.6. Pain in upper right side of abdomen rated at a 2. Pain has been present since this morning. Pt reports he has a drain in that area. Pain described as cramping with movement. Pt has not taken any medications today and hasn't taken insulin in three days due to being out. Pt needs a refill on insulin. Pt has eaten this morning.

## 2016-01-27 ENCOUNTER — Encounter (HOSPITAL_COMMUNITY): Payer: Medicare Other

## 2016-01-27 LAB — MICROALBUMIN / CREATININE URINE RATIO
Creatinine, Urine: 343 mg/dL (ref 20–370)
MICROALB/CREAT RATIO: 7 ug/mg{creat} (ref <30)
Microalb, Ur: 2.5 mg/dL — ABNORMAL HIGH

## 2016-01-30 ENCOUNTER — Encounter (HOSPITAL_COMMUNITY): Payer: Medicare Other

## 2016-01-30 ENCOUNTER — Ambulatory Visit (INDEPENDENT_AMBULATORY_CARE_PROVIDER_SITE_OTHER): Payer: Medicare Other | Admitting: *Deleted

## 2016-01-30 DIAGNOSIS — Z5181 Encounter for therapeutic drug level monitoring: Secondary | ICD-10-CM | POA: Diagnosis not present

## 2016-01-30 DIAGNOSIS — I4891 Unspecified atrial fibrillation: Secondary | ICD-10-CM

## 2016-01-30 DIAGNOSIS — Z7901 Long term (current) use of anticoagulants: Secondary | ICD-10-CM | POA: Diagnosis not present

## 2016-01-30 LAB — POCT INR: INR: 2.7

## 2016-01-30 NOTE — Patient Instructions (Addendum)
Surgery date for gallbladder removal on 02/07/2016  Hold coumadin 5 days prior to day of surgery Take your last coumadin tablet on 02/01/2016  Hold plavix 7 days prior to day of surgery Take your last plavix on 01/30/2016  Start your coumadin and plavix back as instructed by MD usually the same day.

## 2016-02-01 ENCOUNTER — Encounter (HOSPITAL_COMMUNITY): Payer: Medicare Other

## 2016-02-02 ENCOUNTER — Encounter (HOSPITAL_COMMUNITY): Payer: Self-pay | Admitting: *Deleted

## 2016-02-02 NOTE — Progress Notes (Signed)
Cardiac Individual Treatment Plan  Patient Details  Name: [name] [name] MRN: [id number] Date of Birth: 07/21/1956 Referring Provider:        CARDIAC REHAB PHASE II EXERCISE from 11/23/2015 in [place]   Referring Provider  Loralie Champagne MD      Initial Encounter Date:       CARDIAC REHAB PHASE II EXERCISE from 11/23/2015 in [place]   Date  10/27/15   Referring Provider  Loralie Champagne MD      Visit Diagnosis: No diagnosis found.  Patient's Home Medications on Admission:  Current outpatient prescriptions:  .  acetaminophen (TYLENOL) 500 MG tablet, Take 1,000 mg by mouth every 6 (six) hours as needed (pain). Reported on 08/18/2015, Disp: , Rfl:  .  amLODipine (NORVASC) 5 MG tablet, Take 1 tablet (5 mg total) by mouth daily., Disp: 90 tablet, Rfl: 3 .  atorvastatin (LIPITOR) 80 MG tablet, Take 1 tablet (80 mg total) by mouth daily., Disp: 30 tablet, Rfl: 1 .  carvedilol (COREG) 12.5 MG tablet, Take 1 tablet (12.5 mg total) by mouth 2 (two) times daily with a meal., Disp: 180 tablet, Rfl: 3 .  clopidogrel (PLAVIX) 75 MG tablet, Take 1 tablet (75 mg total) by mouth daily with breakfast., Disp: 90 tablet, Rfl: 3 .  furosemide (LASIX) 40 MG tablet, TAKE ONE TABLET BY MOUTH IN THE MORNING AND ONE-HALF TABLET IN THE EVENING (Patient taking differently: Take 40 mg by mouth 2 (two) times daily as needed for fluid. ), Disp: 90 tablet, Rfl: 3 .  hydrALAZINE (APRESOLINE) 50 MG tablet, Take 1 tablet (50 mg total) by mouth 3 (three) times daily., Disp: 270 tablet, Rfl: 3 .  Insulin Glargine (LANTUS SOLOSTAR) 100 UNIT/ML Solostar Pen, Inject 50 Units into the skin every morning. And pen needles 1/day, Disp: 30 mL, Rfl: 3 .  metFORMIN (GLUCOPHAGE) 1000 MG tablet, Take 1 tablet (1,000 mg total) by mouth 2 (two) times daily with a meal., Disp: 180 tablet, Rfl: 3 .  nitroGLYCERIN (NITROSTAT) 0.4 MG SL tablet, Place 0.4 mg under the  tongue every 5 (five) minutes as needed for chest pain (x 3 tabs daily). Reported on 08/18/2015, Disp: , Rfl:  .  oxyCODONE-acetaminophen (ROXICET) 5-325 MG tablet, Take 1 tablet by mouth every 4 (four) hours as needed for severe pain., Disp: 30 tablet, Rfl: 0 .  potassium chloride SA (K-DUR,KLOR-CON) 20 MEQ tablet, Take 1 tablet (20 mEq total) by mouth daily. (Patient taking differently: Take 10 mEq by mouth every other day. ), Disp: 90 tablet, Rfl: 3 .  spironolactone (ALDACTONE) 25 MG tablet, Take 1 tablet (25 mg total) by mouth daily., Disp: 90 tablet, Rfl: 3 .  warfarin (COUMADIN) 5 MG tablet, Take as directed by coumadin clinic (Patient taking differently: Take 2.5-5 mg by mouth daily. Take 2.5mg  by mouth on Monday and Friday. Take 5 mg by mouth on all other days), Disp: 90 tablet, Rfl: 1  Past Medical History: Past Medical History  Diagnosis Date  . Hypertension   . Gout   . PAF (paroxysmal atrial fibrillation) (HCC)     On coumadin  . LIVER FUNCTION TESTS, ABNORMAL, HX OF 12/30/2006  . Rectal bleeding 12/18/2011    Scheduled for colonoscopy.    [name] HEPATITIS B, CHRONIC 12/30/2006  . HYPERCHOLESTEROLEMIA 07/11/2010  . MITRAL REGURGITATION 12/30/2006  . COLONIC POLYPS, HX OF 12/30/2006  . CAD (coronary artery disease), native coronary artery     a.  Nonobstructive CAD by cath 2013 - diffuse distal and branch vessel CAD, no severe disease in the major coronaries, LV mild global hypokinesis, EF 45%. b. ETT-Sestamibi 5/14: EF 31%, small fixed inferior defect with no ischemia.  . Chronic CHF ([place])     a. Mixed ICM/NICM (?EtOH). EF 35% in 2008. Echo 5/13: EF 60-65%, mod LVH, EF 45% on V gram in 12/2011. EF 12/2012: EF 50-55%, mild LVH, inferobasal HK, mild MR. ETT-Ses 5/14 EF 41%. Cardiac MRI 5/14: EF 44%, mild global HK, subepicardial delayed enhancement in nonspecific RV insertion pattern.  . H/O atrial flutter 2007    a. Ablations in 2007, 2008.  [name] History of medication noncompliance   . History of  alcohol abuse   . 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 Heart murmur   . Anginal pain ([place])   . Sleep apnea     "suppose to send mask but they never did" (05/03/2015)  . Type II diabetes mellitus ([place])   . Left sciatic nerve pain since 04/2015  . Anxiety   . Cholecystitis   . Arrhythmia   . History of nuclear stress test     Myoview 3/17: + chest pain; EF 33%, downsloping ST depression 2, V4-6, LVH with strain, no ischemia on images; intermediate risk     Tobacco Use: History  Smoking status  . Former Smoker -- 4.00 packs/day for 18 years  . Types: Cigarettes  . Quit date: 08/14/1983  Smokeless tobacco  . Never Used    Labs: Recent Review Flowsheet Data    Labs for ITP Cardiac and Pulmonary Rehab Latest Ref Rng 05/09/2015 07/12/2015 08/04/2015 11/10/2015 01/26/2016   Cholestrol 125 - 200 mg/dL - 104(L) - - -   LDLCALC <130 mg/dL - 44 - - -   HDL >=40 mg/dL - 44 - - -   Trlycerides <150 mg/dL - 78 - - -   Hemoglobin A1c - 8.5 - 6.8 8.0 7.6      Capillary Blood Glucose: Lab Results  Component Value Date   GLUCAP 146* 01/13/2016   GLUCAP 232* 01/13/2016   GLUCAP 129* 01/13/2016   GLUCAP 253* 01/11/2016   GLUCAP 139* 01/06/2016     Exercise Target Goals:    Exercise Program Goal: Individual exercise prescription set with THRR, safety & activity barriers. Participant demonstrates ability to understand and report RPE using BORG scale, to self-measure pulse accurately, and to acknowledge the importance of the exercise prescription.  Exercise Prescription Goal: Starting with aerobic activity 30 plus minutes a day, 3 days per week for initial exercise prescription. Provide home exercise prescription and guidelines that participant acknowledges understanding prior to discharge.  Activity Barriers & Risk Stratification:   6 Minute Walk:   Initial Exercise Prescription:   Perform Capillary Blood Glucose checks as needed.  Exercise Prescription  Changes:     Exercise Prescription Changes      12/13/15 1000 12/30/15 1115         Exercise Review   Progression Yes Yes      Response to Exercise   Blood Pressure (Admit) 144/82 mmHg 118/70 mmHg      Blood Pressure (Exercise) 150/78 mmHg 124/80 mmHg      Blood Pressure (Exit) 138/80 mmHg 116/78 mmHg      Heart Rate (Admit) 86 bpm 89 bpm      Heart Rate (Exercise) 119 bpm 112 bpm      Heart Rate (Exit) 86 bpm 89 bpm  Rating of Perceived Exertion (Exercise) 12 13      Symptoms hypertension       Comments Home Exercise Given 11/25/15 Home Exercise Given 11/25/15      Duration Progress to 30 minutes of continuous aerobic without signs/symptoms of physical distress Progress to 30 minutes of continuous aerobic without signs/symptoms of physical distress      Intensity THRR unchanged THRR unchanged      Progression   Progression Continue to progress workloads to maintain intensity without signs/symptoms of physical distress. Continue to progress workloads to maintain intensity without signs/symptoms of physical distress.      Average METs 3.6 3.7      Resistance Training   Training Prescription Yes Yes      Weight 3 lbs 5lbs.      Reps 10-12 10-12      Interval Training   Interval Training No No      Bike   Level 1.8 1.5      Minutes 1.8  was reduced back to 1.5 due to elevated bp 10      METs 4.47 3.88      NuStep   Level 3 3      Minutes 10 10      METs 3 3.7      Track   Laps 16 17      Minutes 10 10      METs 3.76 3.95      Home Exercise Plan   Plans to continue exercise at [employer] [facility] [employer] (comment)  [place] (comment)      Frequency Add 2 additional days to program exercise sessions. Add 2 additional days to program exercise sessions.         Exercise Comments:     Exercise Comments      12/13/15 1024 01/02/16 0923         Exercise Comments Pt is still having some hypertension issue, especially on bike, but it is improving. Making  gradual progress with exercise.         Discharge Exercise Prescription (Final Exercise Prescription Changes):     Exercise Prescription Changes - 12/30/15 1115    Exercise Review   Progression Yes   Response to Exercise   Blood Pressure (Admit) 118/70 mmHg   Blood Pressure (Exercise) 124/80 mmHg   Blood Pressure (Exit) 116/78 mmHg   Heart Rate (Admit) 89 bpm   Heart Rate (Exercise) 112 bpm   Heart Rate (Exit) 89 bpm   Rating of Perceived Exertion (Exercise) 13   Comments Home Exercise Given 11/25/15   Duration Progress to 30 minutes of continuous aerobic without signs/symptoms of physical distress   Intensity THRR unchanged   Progression   Progression Continue to progress workloads to maintain intensity without signs/symptoms of physical distress.   Average METs 3.7   Resistance Training   Training Prescription Yes   Weight 5lbs.   Reps 10-12   Interval Training   Interval Training No   Bike   Level 1.5   Minutes 10   METs 3.88   NuStep   Level 3   Minutes 10   METs 3.7   Track   Laps 17   Minutes 10   METs 3.95   Home Exercise Plan   Plans to continue exercise at [facility] (comment)   Frequency Add 2 additional days to program exercise sessions.      Nutrition:  Target Goals: Understanding of nutrition guidelines, daily intake of sodium 1500mg ,  cholesterol 200mg , calories 30% from fat and 7% or less from saturated fats, daily to have 5 or more servings of fruits and vegetables.  Biometrics:    Nutrition Therapy Plan and Nutrition Goals:   Nutrition Discharge: Nutrition Scores:   Nutrition Goals Re-Evaluation:   Psychosocial: Target Goals: Acknowledge presence or absence of depression, maximize coping skills, provide positive support system. Participant is able to verbalize types and ability to use techniques and skills needed for reducing stress and depression.  Initial Review & Psychosocial Screening:   Quality of Life  Scores:   PHQ-9:     Recent Review Flowsheet Data    Depression screen [facility] 2/9 01/26/2016 12/29/2015 11/11/2015 08/18/2015 05/26/2015   Decreased Interest 0 1 0 0 0   Down, Depressed, Hopeless 0 0 0 0 0   PHQ - 2 Score 0 1 0 0 0   Altered sleeping - 1 - - -   Tired, decreased energy - 1 - - -   Change in appetite - 1 - - -   Feeling bad or failure about yourself  - 1 - - -   Trouble concentrating - 0 - - -   Moving slowly or fidgety/restless - 0 - - -   Suicidal thoughts - 0 - - -   PHQ-9 Score - 5 - - -      Psychosocial Evaluation and Intervention:   Psychosocial Re-Evaluation:   Vocational Rehabilitation: Provide vocational rehab assistance to qualifying candidates.   Vocational Rehab Evaluation & Intervention:   Education: Education Goals: Education classes will be provided on a weekly basis, covering required topics. Participant will state understanding/return demonstration of topics presented.  Learning Barriers/Preferences:   Education Topics: Count Your Pulse:  -Group instruction provided by verbal instruction, demonstration, patient participation and written materials to support subject.  Instructors address importance of being able to find your pulse and how to count your pulse when at home without a heart monitor.  Patients get hands on experience counting their pulse with staff help and individually.      CARDIAC REHAB PHASE II EXERCISE from 01/11/2016 in [place]   Date  12/16/15   Educator  Andi Hence   Instruction Review Code  R- Review/reinforce [Second Class for practice]      Heart Attack, Angina, and Risk Factor Modification:  -Group instruction provided by verbal instruction, video, and written materials to support subject.  Instructors address signs and symptoms of angina and heart attacks.    Also discuss risk factors for heart disease and how to make changes to improve heart health risk factors.      CARDIAC REHAB  PHASE II EXERCISE from 01/11/2016 in [place]   Date  12/14/15   Instruction Review Code  2- meets goals/outcomes      Functional Fitness:  -Group instruction provided by verbal instruction, demonstration, patient participation, and written materials to support subject.  Instructors address safety measures for doing things around the house.  Discuss how to get up and down off the floor, how to pick things up properly, how to safely get out of a chair without assistance, and balance training.      CARDIAC REHAB PHASE II EXERCISE from 01/11/2016 in [place]   Date  12/02/15   Instruction Review Code  2- meets goals/outcomes      Meditation and Mindfulness:  -Group instruction provided by verbal instruction, patient participation, and written  materials to support subject.  Instructor addresses importance of mindfulness and meditation practice to help reduce stress and improve awareness.  Instructor also leads participants through a meditation exercise.       CARDIAC REHAB PHASE II EXERCISE from 01/11/2016 in [facility]   Date  01/04/16   Educator  Jeanella Craze   Instruction Review Code  2- meets goals/outcomes      Stretching for Flexibility and Mobility:  -Group instruction provided by verbal instruction, patient participation, and written materials to support subject.  Instructors lead participants through series of stretches that are designed to increase flexibility thus improving mobility.  These stretches are additional exercise for major muscle groups that are typically performed during regular warm up and cool down.      CARDIAC REHAB PHASE II EXERCISE from 01/11/2016 in [place]   Date  01/07/16   Educator  Luetta Nutting Iline Oven   Instruction Review Code  2- meets goals/outcomes      Hands Only CPR Anytime:  -Group instruction provided by verbal  instruction, video, patient participation and written materials to support subject.  Instructors co-teach with AHA video for hands only CPR.  Participants get hands on experience with mannequins.   Nutrition I class: Heart Healthy Eating:  -Group instruction provided by PowerPoint slides, verbal discussion, and written materials to support subject matter. The instructor gives an explanation and review of the Therapeutic Lifestyle Changes diet recommendations, which includes a discussion on lipid goals, dietary fat, sodium, fiber, plant stanol/sterol esters, sugar, and the components of a well-balanced, healthy diet.      CARDIAC REHAB PHASE II EXERCISE from 01/11/2016 in [place]   Date  11/18/15   Educator  RD   Instruction Review Code  Not applicable      Nutrition II class: Lifestyle Skills:  -Group instruction provided by PowerPoint slides, verbal discussion, and written materials to support subject matter. The instructor gives an explanation and review of label reading, grocery shopping for heart health, heart healthy recipe modifications, and ways to make healthier choices when eating out.      CARDIAC REHAB PHASE II EXERCISE from 01/11/2016 in [place]   Date  11/18/15   Educator  RD   Instruction Review Code  Not applicable      Diabetes Question & Answer:  -Group instruction provided by PowerPoint slides, verbal discussion, and written materials to support subject matter. The instructor gives an explanation and review of diabetes co-morbidities, pre- and post-prandial blood glucose goals, pre-exercise blood glucose goals, signs, symptoms, and treatment of hypoglycemia and hyperglycemia, and foot care basics.      CARDIAC REHAB PHASE II EXERCISE from 01/11/2016 in [place]   Date  12/09/15   Educator  RD   Instruction Review Code  2- meets goals/outcomes      Diabetes Blitz:   -Group instruction provided by PowerPoint slides, verbal discussion, and written materials to support subject matter. The instructor gives an explanation and review of the physiology behind type 1 and type 2 diabetes, diabetes medications and rational behind using different medications, pre- and post-prandial blood glucose recommendations and Hemoglobin A1c goals, diabetes diet, and exercise including blood glucose guidelines for exercising safely.       CARDIAC REHAB PHASE II EXERCISE from 01/11/2016 in [place]   Date  11/18/15   Educator  RD  Instruction Review Code  Not applicable      Portion Distortion:  -Group instruction provided by PowerPoint slides, verbal discussion, written materials, and food models to support subject matter. The instructor gives an explanation of serving size versus portion size, changes in portions sizes over the last 20 years, and what consists of a serving from each food group.   Stress Management:  -Group instruction provided by verbal instruction, video, and written materials to support subject matter.  Instructors review role of stress in heart disease and how to cope with stress positively.        CARDIAC REHAB PHASE II EXERCISE from 01/11/2016 in [place]   Date  11/30/15   Instruction Review Code  2- meets goals/outcomes      Exercising on Your Own:  -Group instruction provided by verbal instruction, power point, and written materials to support subject.  Instructors discuss benefits of exercise, components of exercise, frequency and intensity of exercise, and end points for exercise.  Also discuss use of nitroglycerin and activating EMS.  Review options of places to exercise outside of rehab.  Review guidelines for sex with heart disease.      CARDIAC REHAB PHASE II EXERCISE from 01/11/2016 in [place]   Date  01/11/16   Educator  Seward Carol,  MS,ACSM CCES      Cardiac Drugs I:  -Group instruction provided by verbal instruction and written materials to support subject.  Instructor reviews cardiac drug classes: antiplatelets, anticoagulants, beta blockers, and statins.  Instructor discusses reasons, side effects, and lifestyle considerations for each drug class.      CARDIAC REHAB PHASE II EXERCISE from 01/11/2016 in [place]   Date  11/23/15   Educator  Pharm D   Instruction Review Code  2- meets goals/outcomes      Cardiac Drugs II:  -Group instruction provided by verbal instruction and written materials to support subject.  Instructor reviews cardiac drug classes: angiotensin converting enzyme inhibitors (ACE-I), angiotensin II receptor blockers (ARBs), nitrates, and calcium channel blockers.  Instructor discusses reasons, side effects, and lifestyle considerations for each drug class.      CARDIAC REHAB PHASE II EXERCISE from 01/11/2016 in [place]   Date  12/21/15   Educator  Pharm D   Instruction Review Code  2- meets goals/outcomes      Anatomy and Physiology of the Circulatory System:  -Group instruction provided by verbal instruction, video, and written materials to support subject.  Reviews functional anatomy of heart, how it relates to various diagnoses, and what role the heart plays in the overall system.   Knowledge Questionnaire Score:   Core Components/Risk Factors/Patient Goals at Admission:   Core Components/Risk Factors/Patient Goals Review:      Goals and Risk Factor Review      01/02/16 0921           Core Components/Risk Factors/Patient Goals Review   Personal Goals Review Sedentary       Review Pt has noticed an improvement in his stamina. Home exercise is inconsistent.       Expected Outcomes Consistently walk 1 to 2 days at home.          Core Components/Risk Factors/Patient Goals at Discharge (Final Review):       Goals and Risk Factor Review - 01/02/16 0921    Core Components/Risk Factors/Patient Goals Review   Personal Goals Review Sedentary  Review Pt has noticed an improvement in his stamina. Home exercise is inconsistent.   Expected Outcomes Consistently walk 1 to 2 days at home.      ITP Comments:   Comments:  Pt is making expected progress toward personal goals after completing  23 sessions. Pt is on hold pending lap cholecystectomy on next week.  Will await medical clearance before resuming exercise. Recommend continued exercise and life style modification education including  stress management and relaxation techniques to decrease cardiac risk profile. Cherre Huger, BSN

## 2016-02-02 NOTE — Patient Instructions (Addendum)
[name] [name]  02/02/2016   Your procedure is scheduled on: Tuesday 02/07/2016  Report to [facility] Main  Entrance take [place]  elevators to 3rd floor to  [place] at   [place]  AM.  Call this number if you have problems the morning of surgery [phone]   Remember: ONLY 1 PERSON MAY GO WITH YOU TO SHORT STAY TO GET  READY MORNING OF [place].   Do not eat food or drink liquids :After Midnight.     Take these medicines the morning of surgery with A SIP OF WATER: Amlodipine, Carvedilol, Hydralazine              DO NOT TAKE ANY DIABETIC MEDICATIONS DAY OF YOUR SURGERY!                               You may not have any metal on your body including hair pins and              piercings  Do not wear jewelry,  lotions, powders or perfumes, deodorant                       Men may shave face and neck.   Do not bring valuables to the hospital. [place].  Contacts, dentures or bridgework may not be worn into surgery.  Leave suitcase in the car. After surgery it may be brought to your room.              Special instructions:  Coughing and deep breathing exercises, leg exercises                  Please read over the following fact sheets you were given: _____________________________________________________________________             [facility] - Preparing for Surgery Before surgery, you can play an important role.  Because skin is not sterile, your skin needs to be as free of germs as possible.  You can reduce the number of germs on your skin by washing with CHG (chlorahexidine gluconate) soap before surgery.  CHG is an antiseptic cleaner which kills germs and bonds with the skin to continue killing germs even after washing. Please DO NOT use if you have an allergy to CHG or antibacterial soaps.  If your skin becomes reddened/irritated stop using the CHG and inform your nurse when you arrive at  Short Stay. Do not shave (including legs and underarms) for at least 48 hours prior to the first CHG shower.  You may shave your face/neck. Please follow these instructions carefully:  1.  Shower with CHG Soap the night before surgery and the  morning of Surgery.  2.  If you choose to wash your hair, wash your hair first as usual with your  normal  shampoo.  3.  After you shampoo, rinse your hair and body thoroughly to remove the  shampoo.                           4.  Use CHG as you would any other liquid soap.  You can apply chg directly  to the skin and wash  Gently with a scrungie or clean washcloth.  5.  Apply the CHG Soap to your body ONLY FROM THE NECK DOWN.   Do not use on face/ open                           Wound or open sores. Avoid contact with eyes, ears mouth and genitals (private parts).                       Wash face,  Genitals (private parts) with your normal soap.             6.  Wash thoroughly, paying special attention to the area where your surgery  will be performed.  7.  Thoroughly rinse your body with warm water from the neck down.  8.  DO NOT shower/wash with your normal soap after using and rinsing off  the CHG Soap.                9.  Pat yourself dry with a clean towel.            10.  Wear clean pajamas.            11.  Place clean sheets on your bed the night of your first shower and do not  sleep with pets. Day of Surgery : Do not apply any lotions/deodorants the morning of surgery.  Please wear clean clothes to the hospital/surgery center.  FAILURE TO FOLLOW THESE INSTRUCTIONS MAY RESULT IN THE CANCELLATION OF YOUR SURGERY PATIENT SIGNATURE_________________________________  NURSE SIGNATURE__________________________________  ________________________________________________________________________

## 2016-02-03 ENCOUNTER — Encounter (HOSPITAL_COMMUNITY)
Admission: RE | Admit: 2016-02-03 | Discharge: 2016-02-03 | Disposition: A | Discharge status: Home / Self Care | Payer: Medicare Other | Source: Ambulatory Visit | Attending: General Surgery | Admitting: General Surgery

## 2016-02-03 ENCOUNTER — Encounter (HOSPITAL_COMMUNITY): Payer: Medicare Other

## 2016-02-03 ENCOUNTER — Encounter (HOSPITAL_COMMUNITY): Payer: Self-pay

## 2016-02-03 DIAGNOSIS — Z87891 Personal history of nicotine dependence: Secondary | ICD-10-CM | POA: Insufficient documentation

## 2016-02-03 DIAGNOSIS — I480 Paroxysmal atrial fibrillation: Secondary | ICD-10-CM | POA: Diagnosis not present

## 2016-02-03 DIAGNOSIS — K811 Chronic cholecystitis: Secondary | ICD-10-CM | POA: Diagnosis not present

## 2016-02-03 DIAGNOSIS — Z01812 Encounter for preprocedural laboratory examination: Secondary | ICD-10-CM | POA: Diagnosis not present

## 2016-02-03 DIAGNOSIS — I2510 Atherosclerotic heart disease of native coronary artery without angina pectoris: Secondary | ICD-10-CM | POA: Diagnosis not present

## 2016-02-03 DIAGNOSIS — Z79899 Other long term (current) drug therapy: Secondary | ICD-10-CM | POA: Insufficient documentation

## 2016-02-03 DIAGNOSIS — Z7901 Long term (current) use of anticoagulants: Secondary | ICD-10-CM | POA: Diagnosis not present

## 2016-02-03 DIAGNOSIS — Z7902 Long term (current) use of antithrombotics/antiplatelets: Secondary | ICD-10-CM | POA: Insufficient documentation

## 2016-02-03 DIAGNOSIS — I5032 Chronic diastolic (congestive) heart failure: Secondary | ICD-10-CM | POA: Insufficient documentation

## 2016-02-03 DIAGNOSIS — N189 Chronic kidney disease, unspecified: Secondary | ICD-10-CM | POA: Diagnosis not present

## 2016-02-03 HISTORY — DX: Reserved for inherently not codable concepts without codable children: IMO0001

## 2016-02-03 HISTORY — DX: Personal history of other diseases of the digestive system: Z87.19

## 2016-02-03 LAB — CBC WITH DIFFERENTIAL/PLATELET
BASOS PCT: 0 %
Basophils Absolute: 0 10*3/uL (ref 0.0–0.1)
Eosinophils Absolute: 0.2 10*3/uL (ref 0.0–0.7)
Eosinophils Relative: 3 %
HEMATOCRIT: 40.7 % (ref 39.0–52.0)
HEMOGLOBIN: 13.7 g/dL (ref 13.0–17.0)
LYMPHS ABS: 2.1 10*3/uL (ref 0.7–4.0)
LYMPHS PCT: 36 %
MCH: 27.2 pg (ref 26.0–34.0)
MCHC: 33.7 g/dL (ref 30.0–36.0)
MCV: 80.9 fL (ref 78.0–100.0)
MONO ABS: 0.7 10*3/uL (ref 0.1–1.0)
MONOS PCT: 11 %
NEUTROS ABS: 3 10*3/uL (ref 1.7–7.7)
NEUTROS PCT: 50 %
Platelets: 187 10*3/uL (ref 150–400)
RBC: 5.03 MIL/uL (ref 4.22–5.81)
RDW: 14.8 % (ref 11.5–15.5)
WBC: 6 10*3/uL (ref 4.0–10.5)

## 2016-02-03 LAB — COMPREHENSIVE METABOLIC PANEL
ALK PHOS: 54 U/L (ref 38–126)
ALT: 14 U/L — ABNORMAL LOW (ref 17–63)
ANION GAP: 9 (ref 5–15)
AST: 20 U/L (ref 15–41)
Albumin: 4.3 g/dL (ref 3.5–5.0)
BILIRUBIN TOTAL: 0.9 mg/dL (ref 0.3–1.2)
BUN: 13 mg/dL (ref 6–20)
CALCIUM: 9.5 mg/dL (ref 8.9–10.3)
CO2: 23 mmol/L (ref 22–32)
Chloride: 105 mmol/L (ref 101–111)
Creatinine, Ser: 1.25 mg/dL — ABNORMAL HIGH (ref 0.61–1.24)
GLUCOSE: 136 mg/dL — AB (ref 65–99)
POTASSIUM: 3.8 mmol/L (ref 3.5–5.1)
Sodium: 137 mmol/L (ref 135–145)
TOTAL PROTEIN: 8 g/dL (ref 6.5–8.1)

## 2016-02-03 LAB — PROTIME-INR
INR: 1.46 (ref 0.00–1.49)
PROTHROMBIN TIME: 17.8 s — AB (ref 11.6–15.2)

## 2016-02-03 LAB — APTT: aPTT: 38 seconds — ABNORMAL HIGH (ref 24–37)

## 2016-02-03 NOTE — Progress Notes (Signed)
02-03-16 - PT, PTT, CMP lab results from preop visit on 02-03-16 faxed to Dr. Redmond Pulling via [facility]

## 2016-02-06 ENCOUNTER — Encounter (HOSPITAL_COMMUNITY): Payer: Medicare Other

## 2016-02-06 ENCOUNTER — Other Ambulatory Visit (HOSPITAL_COMMUNITY): Payer: Self-pay | Admitting: Anesthesiology

## 2016-02-06 NOTE — Progress Notes (Signed)
01-26-16 - HgA1C (7.6) - EPIC 01-26-16 - LOV - Dr. Doreene Burke (int.med.) - EPIC 01-20-16 - EKG - (Epic) 01-20-16 - LOV Dr. Aundra Dubin (cardio) - EPIC

## 2016-02-06 NOTE — Anesthesia Preprocedure Evaluation (Addendum)
Anesthesia Evaluation  Patient identified by MRN, date of birth, ID band Patient awake    Reviewed: Allergy & Precautions, H&P , Patient's Chart, lab work & pertinent test results, reviewed documented beta blocker date and time   Airway Mallampati: II  TM Distance: >3 FB Neck ROM: full    Dental no notable dental hx.    Pulmonary former smoker,    Pulmonary exam normal breath sounds clear to auscultation       Cardiovascular Exercise Tolerance: Good hypertension,  Rhythm:regular Rate:Normal     Neuro/Psych    GI/Hepatic   Endo/Other  diabetes  Renal/GU      Musculoskeletal   Abdominal   Peds  Hematology   Anesthesia Other Findings    PAF on coumadin; off x 6 days. : last EKG NSR  HEPATITIS B, CHRONIC 12/30/2006   MITRAL REGURGITATION 12/30/2006    CAD (coronary artery disease), native coronary artery   EF 31%, small fixed inferior defect with no ischemia.   Chronic CHF ([place])   . EF 35% in 2008 . EF 12/2012: EF 50-55% mild LVH, inferobasal HK,MRI 5/14 : EF 44%, mild global HK, subepicardial delayed enhancement in nonspecific RV insertion pattern.  H/O atrial flutter     OSA.......no mask    Type II diabetes mellitus ([place])       Myoview 3/17: + chest pain; EF 33%, downsloping ST depression 2, V4-6, LVH with strain, no ischemia on images; intermediate risk     Reproductive/Obstetrics                          Anesthesia Physical Anesthesia Plan  ASA: III  Anesthesia Plan: General   Post-op Pain Management:    Induction: Intravenous  Airway Management Planned: Oral ETT  Additional Equipment:   Intra-op Plan:   Post-operative Plan: Extubation in OR  Informed Consent: I have reviewed the patients History and Physical, chart, labs and discussed the procedure including the risks, benefits and alternatives for the proposed anesthesia with the patient or authorized  representative who has indicated his/her understanding and acceptance.   Dental Advisory Given and Dental advisory given  Plan Discussed with: CRNA and Surgeon  Anesthesia Plan Comments: (  Discussed general anesthesia, including possible nausea, instrumentation of airway, sore throat,pulmonary aspiration, etc. I asked if the were any outstanding questions, or  concerns before we proceeded. )        Anesthesia Quick Evaluation

## 2016-02-07 ENCOUNTER — Ambulatory Visit (HOSPITAL_COMMUNITY): Payer: Medicare Other

## 2016-02-07 ENCOUNTER — Observation Stay (HOSPITAL_COMMUNITY)
Admission: RE | Admit: 2016-02-07 | Discharge: 2016-02-08 | Disposition: A | Discharge status: Home / Self Care | Payer: Medicare Other | Source: Ambulatory Visit | Attending: General Surgery | Admitting: General Surgery

## 2016-02-07 ENCOUNTER — Ambulatory Visit (HOSPITAL_COMMUNITY): Payer: Medicare Other | Admitting: Anesthesiology

## 2016-02-07 ENCOUNTER — Encounter (HOSPITAL_COMMUNITY): Payer: Self-pay

## 2016-02-07 ENCOUNTER — Encounter (HOSPITAL_COMMUNITY)
Admission: RE | Disposition: A | Discharge status: Home / Self Care | Payer: Self-pay | Source: Ambulatory Visit | Attending: General Surgery

## 2016-02-07 DIAGNOSIS — Z87891 Personal history of nicotine dependence: Secondary | ICD-10-CM | POA: Insufficient documentation

## 2016-02-07 DIAGNOSIS — K8020 Calculus of gallbladder without cholecystitis without obstruction: Secondary | ICD-10-CM | POA: Diagnosis not present

## 2016-02-07 DIAGNOSIS — I25119 Atherosclerotic heart disease of native coronary artery with unspecified angina pectoris: Secondary | ICD-10-CM

## 2016-02-07 DIAGNOSIS — K829 Disease of gallbladder, unspecified: Secondary | ICD-10-CM

## 2016-02-07 DIAGNOSIS — I480 Paroxysmal atrial fibrillation: Secondary | ICD-10-CM | POA: Insufficient documentation

## 2016-02-07 DIAGNOSIS — Z7901 Long term (current) use of anticoagulants: Secondary | ICD-10-CM | POA: Insufficient documentation

## 2016-02-07 DIAGNOSIS — E1122 Type 2 diabetes mellitus with diabetic chronic kidney disease: Secondary | ICD-10-CM | POA: Insufficient documentation

## 2016-02-07 DIAGNOSIS — Z7902 Long term (current) use of antithrombotics/antiplatelets: Secondary | ICD-10-CM | POA: Diagnosis not present

## 2016-02-07 DIAGNOSIS — Z79899 Other long term (current) drug therapy: Secondary | ICD-10-CM | POA: Diagnosis not present

## 2016-02-07 DIAGNOSIS — K810 Acute cholecystitis: Secondary | ICD-10-CM | POA: Diagnosis present

## 2016-02-07 DIAGNOSIS — Z9049 Acquired absence of other specified parts of digestive tract: Secondary | ICD-10-CM

## 2016-02-07 DIAGNOSIS — I340 Nonrheumatic mitral (valve) insufficiency: Secondary | ICD-10-CM | POA: Diagnosis not present

## 2016-02-07 DIAGNOSIS — I5032 Chronic diastolic (congestive) heart failure: Secondary | ICD-10-CM | POA: Diagnosis not present

## 2016-02-07 DIAGNOSIS — E7800 Pure hypercholesterolemia, unspecified: Secondary | ICD-10-CM | POA: Insufficient documentation

## 2016-02-07 DIAGNOSIS — Z794 Long term (current) use of insulin: Secondary | ICD-10-CM | POA: Diagnosis not present

## 2016-02-07 DIAGNOSIS — K8066 Calculus of gallbladder and bile duct with acute and chronic cholecystitis without obstruction: Secondary | ICD-10-CM | POA: Diagnosis not present

## 2016-02-07 DIAGNOSIS — G4730 Sleep apnea, unspecified: Secondary | ICD-10-CM | POA: Diagnosis not present

## 2016-02-07 DIAGNOSIS — I2510 Atherosclerotic heart disease of native coronary artery without angina pectoris: Secondary | ICD-10-CM | POA: Insufficient documentation

## 2016-02-07 DIAGNOSIS — I130 Hypertensive heart and chronic kidney disease with heart failure and stage 1 through stage 4 chronic kidney disease, or unspecified chronic kidney disease: Secondary | ICD-10-CM | POA: Diagnosis not present

## 2016-02-07 DIAGNOSIS — K8012 Calculus of gallbladder with acute and chronic cholecystitis without obstruction: Principal | ICD-10-CM | POA: Insufficient documentation

## 2016-02-07 DIAGNOSIS — Z9981 Dependence on supplemental oxygen: Secondary | ICD-10-CM | POA: Insufficient documentation

## 2016-02-07 DIAGNOSIS — K811 Chronic cholecystitis: Secondary | ICD-10-CM | POA: Diagnosis not present

## 2016-02-07 DIAGNOSIS — N189 Chronic kidney disease, unspecified: Secondary | ICD-10-CM | POA: Diagnosis not present

## 2016-02-07 DIAGNOSIS — I10 Essential (primary) hypertension: Secondary | ICD-10-CM | POA: Diagnosis not present

## 2016-02-07 HISTORY — PX: CHOLECYSTECTOMY: SHX55

## 2016-02-07 LAB — GLUCOSE, CAPILLARY
GLUCOSE-CAPILLARY: 136 mg/dL — AB (ref 65–99)
GLUCOSE-CAPILLARY: 128 mg/dL — AB (ref 65–99)
GLUCOSE-CAPILLARY: 112 mg/dL — AB (ref 65–99)
Glucose-Capillary: 200 mg/dL — ABNORMAL HIGH (ref 65–99)
Glucose-Capillary: 188 mg/dL — ABNORMAL HIGH (ref 65–99)
Glucose-Capillary: 163 mg/dL — ABNORMAL HIGH (ref 65–99)

## 2016-02-07 LAB — PROTIME-INR
INR: 1.16 (ref 0.00–1.49)
Prothrombin Time: 15 seconds (ref 11.6–15.2)

## 2016-02-07 SURGERY — LAPAROSCOPIC CHOLECYSTECTOMY WITH INTRAOPERATIVE CHOLANGIOGRAM
Anesthesia: General

## 2016-02-07 MED ORDER — ACETAMINOPHEN 650 MG RE SUPP: 650.0000 mg | Freq: Four times a day (QID) | RECTAL | Status: DC | PRN

## 2016-02-07 MED ORDER — DEXAMETHASONE SODIUM PHOSPHATE 10 MG/ML IJ SOLN
INTRAMUSCULAR | Status: DC | PRN
  Administered 2016-02-07: 10 mg via INTRAVENOUS

## 2016-02-07 MED ORDER — PROPOFOL 10 MG/ML IV BOLUS
INTRAVENOUS | Status: DC | PRN
  Administered 2016-02-07: 15 mg via INTRAVENOUS

## 2016-02-07 MED ORDER — POTASSIUM CHLORIDE IN NACL 40-0.9 MEQ/L-% IV SOLN
INTRAVENOUS | Status: DC
  Administered 2016-02-07: 50 mL/h via INTRAVENOUS
  Filled 2016-02-07 (×2): qty 1000

## 2016-02-07 MED ORDER — IOHEXOL 300 MG/ML  SOLN
INTRAMUSCULAR | Status: DC | PRN
  Administered 2016-02-07: 8 mL

## 2016-02-07 MED ORDER — CETYLPYRIDINIUM CHLORIDE 0.05 % MT LIQD
7.0000 mL | Freq: Two times a day (BID) | OROMUCOSAL | Status: DC
  Administered 2016-02-07: 7 mL via OROMUCOSAL

## 2016-02-07 MED ORDER — BUPIVACAINE-EPINEPHRINE 0.25% -1:200000 IJ SOLN
INTRAMUSCULAR | Status: AC
  Filled 2016-02-07: qty 1

## 2016-02-07 MED ORDER — BUPIVACAINE-EPINEPHRINE 0.25% -1:200000 IJ SOLN
INTRAMUSCULAR | Status: DC | PRN
  Administered 2016-02-07: 20 mL

## 2016-02-07 MED ORDER — MIDAZOLAM HCL 5 MG/5ML IJ SOLN
INTRAMUSCULAR | Status: DC | PRN
  Administered 2016-02-07: 2 mg via INTRAVENOUS

## 2016-02-07 MED ORDER — INSULIN ASPART 100 UNIT/ML ~~LOC~~ SOLN
SUBCUTANEOUS | Status: AC
  Filled 2016-02-07: qty 1

## 2016-02-07 MED ORDER — ONDANSETRON HCL 4 MG/2ML IJ SOLN
INTRAMUSCULAR | Status: DC | PRN
  Administered 2016-02-07: 4 mg via INTRAVENOUS

## 2016-02-07 MED ORDER — ENOXAPARIN SODIUM 40 MG/0.4ML ~~LOC~~ SOLN
40.0000 mg | SUBCUTANEOUS | Status: DC
  Administered 2016-02-08: 40 mg via SUBCUTANEOUS
  Filled 2016-02-07: qty 0.4

## 2016-02-07 MED ORDER — PROPOFOL 10 MG/ML IV BOLUS
INTRAVENOUS | Status: AC
  Filled 2016-02-07: qty 20

## 2016-02-07 MED ORDER — IOPAMIDOL (ISOVUE-300) INJECTION 61%
INTRAVENOUS | Status: AC
  Filled 2016-02-07: qty 50

## 2016-02-07 MED ORDER — AMLODIPINE BESYLATE 5 MG PO TABS
5.0000 mg | ORAL_TABLET | Freq: Every day | ORAL | Status: DC
  Administered 2016-02-07: 5 mg via ORAL
  Filled 2016-02-07 (×2): qty 1

## 2016-02-07 MED ORDER — ACETAMINOPHEN 325 MG PO TABS: 650.0000 mg | ORAL_TABLET | Freq: Four times a day (QID) | ORAL | Status: DC | PRN

## 2016-02-07 MED ORDER — OXYCODONE HCL 5 MG PO TABS
5.0000 mg | ORAL_TABLET | ORAL | Status: DC | PRN
  Administered 2016-02-08: 10 mg via ORAL
  Filled 2016-02-07: qty 2
  Filled 2016-02-07: qty 1

## 2016-02-07 MED ORDER — FAMOTIDINE IN NACL 20-0.9 MG/50ML-% IV SOLN
20.0000 mg | Freq: Two times a day (BID) | INTRAVENOUS | Status: DC
  Administered 2016-02-07 (×2): 20 mg via INTRAVENOUS
  Filled 2016-02-07 (×3): qty 50

## 2016-02-07 MED ORDER — LIDOCAINE HCL (CARDIAC) 20 MG/ML IV SOLN
INTRAVENOUS | Status: DC | PRN
  Administered 2016-02-07: 80 mg via INTRATRACHEAL

## 2016-02-07 MED ORDER — INSULIN ASPART 100 UNIT/ML ~~LOC~~ SOLN
4.0000 [IU] | Freq: Once | SUBCUTANEOUS | Status: AC
  Administered 2016-02-07: 4 [IU] via SUBCUTANEOUS

## 2016-02-07 MED ORDER — NITROGLYCERIN 0.4 MG SL SUBL: 0.4000 mg | SUBLINGUAL_TABLET | SUBLINGUAL | Status: DC | PRN

## 2016-02-07 MED ORDER — ROCURONIUM BROMIDE 100 MG/10ML IV SOLN
INTRAVENOUS | Status: AC
  Filled 2016-02-07: qty 1

## 2016-02-07 MED ORDER — LACTATED RINGERS IV SOLN
INTRAVENOUS | Status: DC | PRN
  Administered 2016-02-07: 1 via INTRAVENOUS

## 2016-02-07 MED ORDER — ONDANSETRON HCL 4 MG/2ML IJ SOLN
INTRAMUSCULAR | Status: AC
  Filled 2016-02-07: qty 2

## 2016-02-07 MED ORDER — INSULIN ASPART 100 UNIT/ML ~~LOC~~ SOLN: 0.0000 [IU] | Freq: Every day | SUBCUTANEOUS | Status: DC

## 2016-02-07 MED ORDER — INSULIN ASPART 100 UNIT/ML ~~LOC~~ SOLN
0.0000 [IU] | Freq: Three times a day (TID) | SUBCUTANEOUS | Status: DC
  Administered 2016-02-07 – 2016-02-08 (×2): 3 [IU] via SUBCUTANEOUS

## 2016-02-07 MED ORDER — DEXAMETHASONE SODIUM PHOSPHATE 10 MG/ML IJ SOLN
INTRAMUSCULAR | Status: AC
  Filled 2016-02-07: qty 1

## 2016-02-07 MED ORDER — SUCCINYLCHOLINE CHLORIDE 20 MG/ML IJ SOLN
INTRAMUSCULAR | Status: DC | PRN
  Administered 2016-02-07: 100 mg via INTRAVENOUS

## 2016-02-07 MED ORDER — ATORVASTATIN CALCIUM 80 MG PO TABS
80.0000 mg | ORAL_TABLET | Freq: Every day | ORAL | Status: DC
  Administered 2016-02-07: 80 mg via ORAL
  Filled 2016-02-07 (×2): qty 1

## 2016-02-07 MED ORDER — FENTANYL CITRATE (PF) 250 MCG/5ML IJ SOLN
INTRAMUSCULAR | Status: DC | PRN
  Administered 2016-02-07: 50 ug via INTRAVENOUS
  Administered 2016-02-07 (×2): 25 ug via INTRAVENOUS
  Administered 2016-02-07: 50 ug via INTRAVENOUS

## 2016-02-07 MED ORDER — SUGAMMADEX SODIUM 200 MG/2ML IV SOLN
INTRAVENOUS | Status: DC | PRN
  Administered 2016-02-07: 200 mg via INTRAVENOUS

## 2016-02-07 MED ORDER — ROCURONIUM BROMIDE 100 MG/10ML IV SOLN
INTRAVENOUS | Status: DC | PRN
  Administered 2016-02-07: 30 mg via INTRAVENOUS
  Administered 2016-02-07 (×2): 5 mg via INTRAVENOUS
  Administered 2016-02-07: 10 mg via INTRAVENOUS

## 2016-02-07 MED ORDER — CEFOTETAN DISODIUM-DEXTROSE 2-2.08 GM-% IV SOLR
INTRAVENOUS | Status: AC
  Filled 2016-02-07: qty 50

## 2016-02-07 MED ORDER — SUGAMMADEX SODIUM 200 MG/2ML IV SOLN
INTRAVENOUS | Status: AC
  Filled 2016-02-07: qty 2

## 2016-02-07 MED ORDER — CEFOTETAN DISODIUM-DEXTROSE 2-2.08 GM-% IV SOLR
2.0000 g | INTRAVENOUS | Status: AC
  Administered 2016-02-07: 2 g via INTRAVENOUS

## 2016-02-07 MED ORDER — LACTATED RINGERS IV SOLN
INTRAVENOUS | Status: DC | PRN
  Administered 2016-02-07: 07:00:00 via INTRAVENOUS

## 2016-02-07 MED ORDER — FENTANYL CITRATE (PF) 250 MCG/5ML IJ SOLN
INTRAMUSCULAR | Status: AC
  Filled 2016-02-07: qty 5

## 2016-02-07 MED ORDER — SPIRONOLACTONE 25 MG PO TABS
25.0000 mg | ORAL_TABLET | Freq: Every day | ORAL | Status: DC
  Administered 2016-02-07: 25 mg via ORAL
  Filled 2016-02-07 (×2): qty 1

## 2016-02-07 MED ORDER — LIDOCAINE HCL (CARDIAC) 20 MG/ML IV SOLN
INTRAVENOUS | Status: AC
  Filled 2016-02-07: qty 5

## 2016-02-07 MED ORDER — MORPHINE SULFATE (PF) 2 MG/ML IV SOLN
1.0000 mg | INTRAVENOUS | Status: DC | PRN
  Administered 2016-02-07: 2 mg via INTRAVENOUS
  Filled 2016-02-07 (×2): qty 1

## 2016-02-07 MED ORDER — DIPHENHYDRAMINE HCL 12.5 MG/5ML PO ELIX: 12.5000 mg | ORAL_SOLUTION | Freq: Four times a day (QID) | ORAL | Status: DC | PRN

## 2016-02-07 MED ORDER — FENTANYL CITRATE (PF) 100 MCG/2ML IJ SOLN
25.0000 ug | INTRAMUSCULAR | Status: DC | PRN
  Administered 2016-02-07 (×2): 50 ug via INTRAVENOUS

## 2016-02-07 MED ORDER — FENTANYL CITRATE (PF) 100 MCG/2ML IJ SOLN
INTRAMUSCULAR | Status: AC
  Filled 2016-02-07: qty 2

## 2016-02-07 MED ORDER — CARVEDILOL 12.5 MG PO TABS
12.5000 mg | ORAL_TABLET | Freq: Once | ORAL | Status: AC
  Administered 2016-02-07: 12.5 mg via ORAL
  Filled 2016-02-07: qty 1

## 2016-02-07 MED ORDER — CARVEDILOL 12.5 MG PO TABS
12.5000 mg | ORAL_TABLET | Freq: Two times a day (BID) | ORAL | Status: DC
  Administered 2016-02-07 – 2016-02-08 (×2): 12.5 mg via ORAL
  Filled 2016-02-07 (×2): qty 1

## 2016-02-07 MED ORDER — HYDRALAZINE HCL 50 MG PO TABS
50.0000 mg | ORAL_TABLET | Freq: Three times a day (TID) | ORAL | Status: DC
  Administered 2016-02-07 (×2): 50 mg via ORAL
  Filled 2016-02-07 (×4): qty 1

## 2016-02-07 MED ORDER — ONDANSETRON HCL 4 MG/2ML IJ SOLN: 4.0000 mg | Freq: Four times a day (QID) | INTRAMUSCULAR | Status: DC | PRN

## 2016-02-07 MED ORDER — MIDAZOLAM HCL 2 MG/2ML IJ SOLN
INTRAMUSCULAR | Status: AC
  Filled 2016-02-07: qty 2

## 2016-02-07 MED ORDER — METFORMIN HCL 500 MG PO TABS
1000.0000 mg | ORAL_TABLET | Freq: Two times a day (BID) | ORAL | Status: DC
  Administered 2016-02-07 – 2016-02-08 (×2): 1000 mg via ORAL
  Filled 2016-02-07 (×2): qty 2

## 2016-02-07 MED ORDER — DIPHENHYDRAMINE HCL 50 MG/ML IJ SOLN: 12.5000 mg | Freq: Four times a day (QID) | INTRAMUSCULAR | Status: DC | PRN

## 2016-02-07 MED ORDER — ONDANSETRON 4 MG PO TBDP: 4.0000 mg | ORAL_TABLET | Freq: Four times a day (QID) | ORAL | Status: DC | PRN

## 2016-02-07 SURGICAL SUPPLY — 50 items
ADH SKN CLS APL DERMABOND .7 (GAUZE/BANDAGES/DRESSINGS) ×1
APL SRG 38 LTWT LNG FL B (MISCELLANEOUS) ×1
APPLICATOR ARISTA FLEXITIP XL (MISCELLANEOUS) ×1 IMPLANT
APPLIER CLIP 5 13 M/L LIGAMAX5 (MISCELLANEOUS) ×2
APPLIER CLIP ROT 10 11.4 M/L (STAPLE)
APR CLP MED LRG 11.4X10 (STAPLE)
APR CLP MED LRG 5 ANG JAW (MISCELLANEOUS) ×1
BAG SPEC RTRVL 10 TROC 200 (ENDOMECHANICALS) ×1
CABLE HIGH FREQUENCY MONO STRZ (ELECTRODE) ×2 IMPLANT
CHLORAPREP W/TINT 26ML (MISCELLANEOUS) ×2 IMPLANT
CLIP APPLIE 5 13 M/L LIGAMAX5 (MISCELLANEOUS) ×1 IMPLANT
CLIP APPLIE ROT 10 11.4 M/L (STAPLE) IMPLANT
COVER MAYO STAND STRL (DRAPES) IMPLANT
COVER SURGICAL LIGHT HANDLE (MISCELLANEOUS) ×2 IMPLANT
DECANTER SPIKE VIAL GLASS SM (MISCELLANEOUS) ×2 IMPLANT
DERMABOND ADVANCED (GAUZE/BANDAGES/DRESSINGS) ×1
DERMABOND ADVANCED .7 DNX12 (GAUZE/BANDAGES/DRESSINGS) ×1 IMPLANT
DRAPE C-ARM 42X120 X-RAY (DRAPES) IMPLANT
DRAPE LAPAROSCOPIC ABDOMINAL (DRAPES) IMPLANT
DRSG TEGADERM 2-3/8X2-3/4 SM (GAUZE/BANDAGES/DRESSINGS) ×1 IMPLANT
ELECT PENCIL ROCKER SW 15FT (MISCELLANEOUS) ×2 IMPLANT
ELECT REM PT RETURN 9FT ADLT (ELECTROSURGICAL) ×2
ELECTRODE REM PT RTRN 9FT ADLT (ELECTROSURGICAL) ×1 IMPLANT
GAUZE SPONGE 2X2 8PLY STRL LF (GAUZE/BANDAGES/DRESSINGS) IMPLANT
GLOVE BIO SURGEON STRL SZ7.5 (GLOVE) ×2 IMPLANT
GLOVE INDICATOR 8.0 STRL GRN (GLOVE) ×2 IMPLANT
GOWN STRL REUS W/TWL XL LVL3 (GOWN DISPOSABLE) ×6 IMPLANT
HEMOSTAT ARISTA ABSORB 3G PWDR (MISCELLANEOUS) ×1 IMPLANT
HEMOSTAT SNOW SURGICEL 2X4 (HEMOSTASIS) IMPLANT
KIT BASIN OR (CUSTOM PROCEDURE TRAY) ×2 IMPLANT
L-HOOK LAP DISP 36CM (ELECTROSURGICAL) ×2
LHOOK LAP DISP 36CM (ELECTROSURGICAL) ×1 IMPLANT
POSITIONER SURGICAL ARM (MISCELLANEOUS) IMPLANT
POUCH RETRIEVAL ECOSAC 10 (ENDOMECHANICALS) ×1 IMPLANT
POUCH RETRIEVAL ECOSAC 10MM (ENDOMECHANICALS) ×1
SCISSORS LAP 5X35 DISP (ENDOMECHANICALS) ×2 IMPLANT
SET CHOLANGIOGRAPH MIX (MISCELLANEOUS) IMPLANT
SET IRRIG TUBING LAPAROSCOPIC (IRRIGATION / IRRIGATOR) ×2 IMPLANT
SLEEVE XCEL OPT CAN 5 100 (ENDOMECHANICALS) ×4 IMPLANT
SPONGE GAUZE 2X2 STER 10/PKG (GAUZE/BANDAGES/DRESSINGS) ×1
SUT MNCRL AB 4-0 PS2 18 (SUTURE) ×2 IMPLANT
SUT VICRYL 0 UR6 27IN ABS (SUTURE) IMPLANT
TAPE CLOTH 4X10 WHT NS (GAUZE/BANDAGES/DRESSINGS) IMPLANT
TOWEL OR 17X26 10 PK STRL BLUE (TOWEL DISPOSABLE) ×2 IMPLANT
TOWEL OR NON WOVEN STRL DISP B (DISPOSABLE) ×2 IMPLANT
TRAY LAPAROSCOPIC (CUSTOM PROCEDURE TRAY) ×2 IMPLANT
TROCAR BLADELESS OPT 5 100 (ENDOMECHANICALS) ×2 IMPLANT
TROCAR XCEL BLUNT TIP 100MML (ENDOMECHANICALS) ×2 IMPLANT
TROCAR XCEL NON-BLD 11X100MML (ENDOMECHANICALS) IMPLANT
TUBING INSUF HEATED (TUBING) ×2 IMPLANT

## 2016-02-07 NOTE — Progress Notes (Signed)
Inpatient Diabetes Program Recommendations  AACE/ADA: New Consensus Statement on Inpatient Glycemic Control (2015)  Target Ranges:  Prepandial:   less than 140 mg/dL      Peak postprandial:   less than 180 mg/dL (1-2 hours)      Critically ill patients:  140 - 180 mg/dL   Lab Results  Component Value Date   GLUCAP 128* 02/07/2016   HGBA1C 7.6 01/26/2016   Results for AMBUS, BOCOCK (MRN [id number]) as of 02/07/2016 16:26  Ref. Range 02/07/2016 12:07  Glucose-Capillary Latest Ref Range: 65-99 mg/dL 128 (H)   Review of Glycemic Control  Diabetes history: DM2 Outpatient Diabetes medications: Lantus 50 units QAM, metformin 1000 mg bid Current orders for Inpatient glycemic control: Novolog resistant tidwc and hs, metformin 1000 mg bid  On CL diet this evening.  Inpatient Diabetes Program Recommendations:    Change Novolog to resistant Q4H, then tidwc and hs when diet is advanced to CHO mod med. Give 1/2 Lantus home dose tonight - Lantus 25 units.  Will continue to follow. Thank you. Lorenda Peck, RD, LDN, CDE Inpatient Diabetes Coordinator [phone]

## 2016-02-07 NOTE — Op Note (Signed)
[name] [name] [id number] 09/17/1955 02/07/2016  Laparoscopic Cholecystectomy with IOC Procedure Note  Indications: This patient presents with symptomatic gallbladder disease and will undergo laparoscopic cholecystectomy. He had undergone percutaneous cholecystostomy tube placement in the fall 2016 for cholecystitis since he had undergone placement of a DES for cardiac disease. He was given cardiac clearance as well as permission to hold both anticoagulants for surgery. Please see h&p for additional details.   Pre-operative Diagnosis: chronic cholecystitis with calculous  Post-operative Diagnosis: Same  Surgeon: Gayland Curry   Assistants: Johnathan Hausen MD FACS  Anesthesia: General endotracheal anesthesia  Procedure Details  The patient was seen again in the Holding Room. The risks, benefits, complications, treatment options, and expected outcomes were discussed with the patient. The possibilities of reaction to medication, pulmonary aspiration, perforation of viscus, bleeding, recurrent infection, finding a normal gallbladder, the need for additional procedures, failure to diagnose a condition, the possible need to convert to an open procedure, and creating a complication requiring transfusion or operation were discussed with the patient. The likelihood of improving the patient's symptoms with return to their baseline status is good.  The patient and/or family concurred with the proposed plan, giving informed consent. The site of surgery properly noted. The patient was taken to Operating Room, identified as [name] [name] and the procedure verified as Laparoscopic Cholecystectomy with Intraoperative Cholangiogram. A Time Out was held and the above information confirmed. Antibiotic prophylaxis was administered.   Prior to the induction of general anesthesia, antibiotic prophylaxis was administered. General endotracheal anesthesia was then administered and tolerated well. After the  induction, the abdomen was prepped with Chloraprep and draped in the sterile fashion. The patient was positioned in the supine position. The cholecystostomy tube was prepped outside of the field.   Local anesthetic agent was injected into the skin near the umbilicus and an incision made. We dissected down to the abdominal fascia with blunt dissection.  The fascia was incised vertically and we entered the peritoneal cavity bluntly.  A pursestring suture of 0-Vicryl was placed around the fascial opening.  The Hasson cannula was inserted and secured with the stay suture.  Pneumoperitoneum was then created with CO2 and tolerated well without any adverse changes in the patient's vital signs. An 5-mm port was placed in the subxiphoid position.  Two 5-mm ports were placed in the right upper quadrant. All skin incisions were infiltrated with a local anesthetic agent before making the incision and placing the trocars.   We positioned the patient in reverse Trendelenburg, tilted slightly to the patient's left.  The gallbladder was identified. The omentum surrounding the gallbladder was taken down with cautery. The gallbladder appeared safe to proceed with surgery so the cholecystostomy tube was cut as it entered the abdomen. The fundus was grasped and retracted cephalad. Adhesions were lysed bluntly and with the electrocautery where indicated, taking care not to injure any adjacent organs or viscus. The infundibulum was grasped and retracted laterally, exposing the peritoneum overlying the triangle of Calot. This was then divided and exposed in a blunt fashion. A critical view of the cystic duct and cystic artery was obtained.  The cystic duct was clearly identified and bluntly dissected circumferentially. The cystic duct was ligated with a clip distally.   An incision was made in the cystic duct and the [facility] cholangiogram catheter introduced. The catheter was secured using a clip. A cholangiogram was then obtained which  showed good visualization of the distal and proximal biliary tree with no sign of  filling defects or obstruction.  Contrast flowed easily into the duodenum. The catheter was then removed.   The cystic duct was then ligated with clips and divided. The cystic artery which had been identified & dissected free was ligated with clips and divided as well.   The gallbladder was dissected from the liver bed in retrograde fashion with the electrocautery. The gallbladder was removed and placed in an Ecco sac.  The gallbladder and Ecco sac were then removed through the umbilical port site. The liver bed was irrigated and inspected. Hemostasis was achieved with the electrocautery. Arista was placed in the gallbladder fossa. Copious irrigation was utilized and was repeatedly aspirated until clear.  The pursestring suture was used to close the umbilical fascia.  An additional 0 vicryl was placed at the umbilical fascia.   We again inspected the right upper quadrant for hemostasis.  The umbilical closure was inspected and there was no air leak and nothing trapped within the closure. Pneumoperitoneum was released as we removed the trocars.  4-0 Monocryl was used to close the skin.   Dermabond was applied. The patient was then extubated and brought to the recovery room in stable condition. Instrument, sponge, and needle counts were correct at closure and at the conclusion of the case.   Findings: Chronic Cholecystitis with Cholelithiasis  Estimated Blood Loss: Minimal         Drains: none         Specimens: Gallbladder           Complications: None; patient tolerated the procedure well.         Disposition: PACU - hemodynamically stable.         Condition: stable  [name] [name]. [name] Pulling, MD, FACS General, Bariatric, & Minimally Invasive Surgery [facility] Surgery, [place]

## 2016-02-07 NOTE — H&P (Signed)
[name] [name] is an 60 y.o. male.   Chief Complaint: here for surgery HPI: The patient is a 60 year old male who presents for evaluation of acute cholecystitis. He comes in for long-term follow-up regarding his cholecystostomy tube. This was placed in October for acute cholecystitis. He had recently undergone placement of a drug-eluting stent and was deemed not an operative candidate at that time. He underwent a cholecystostomy tube study on December 6 which showed partial opacification of the gallbladder with multiple filling defects, the cystic duct and common bile duct was patent with contrast emptying into the duodenum. They decided to leave the cholecystostomy tube to drainage. The patient is a very eager to have surgery. However he was admitted recently from January 9 through the 11th for a flutter. He was cardioverted. He underwent a cardiology catheterization in March. There is no new lesions that were amenable to intervention. He has been undergoing cardiac rehabilitation several times a week. He states he is doing well. He denies any fever, chills, abdominal pain. He reports good appetite. He reports some occasional soreness around the actual drain. He denies any chest pain or chest pressure since discharge from the hospital earlier this month. He denies any shortness of breath at rest. He denies any orthopnea, paroxysmal nocturnal dyspnea or edema.  06/30/2015 He comes in for follow-up after being seen in the hospital for acute calculus cholecystitis he was in the hospital from October 22 - October 27. He presented with right upper quadrant pain. He was found to have gallstones on ultrasound but no evidence of cholecystitis. He had ongoing discomfort. His lab work was unremarkable except for an elevated INR. He was on Plavix and Coumadin for heart disease as well as paroxysmal atrial fibrillation. He underwent a nuclear medicine scan the following morning after admission and  was found to have acute cholecystitis. Unfortunately he had just had a drug-eluting stent placed in one of his coronary arteries on September 20. Because of the need not to stop any of his anticoagulation because of concerns for restent stenosis, he underwent placement of a percutaneous cholecystostomy tube on October 25. He denies any abdominal pain. He denies any fever or chills. He denies any nausea or vomiting. He reports a good appetite. He is having about 40 cc of bile out through his drain daily.  He does endorse some occasional chest pain. He denies any edema, paroxysmal internal dyspnea, orthopnea, source of breath at rest. Review of systems-a 12 point review of systems was performed and all systems are negative except for what is mentioned in the HPI  Past Medical History  Diagnosis Date  . Hypertension   . Gout   . PAF (paroxysmal atrial fibrillation) (HCC)     On coumadin  . LIVER FUNCTION TESTS, ABNORMAL, HX OF 12/30/2006  . Rectal bleeding 12/18/2011    Scheduled for colonoscopy.    [name] HEPATITIS B, CHRONIC 12/30/2006  . HYPERCHOLESTEROLEMIA 07/11/2010  . MITRAL REGURGITATION 12/30/2006  . COLONIC POLYPS, HX OF 12/30/2006  . CAD (coronary artery disease), native coronary artery     a. Nonobstructive CAD by cath 2013 - diffuse distal and branch vessel CAD, no severe disease in the major coronaries, LV mild global hypokinesis, EF 45%. b. ETT-Sestamibi 5/14: EF 31%, small fixed inferior defect with no ischemia.  . Chronic CHF ([place])     a. Mixed ICM/NICM (?EtOH). EF 35% in 2008. Echo 5/13: EF 60-65%, mod LVH, EF 45% on V gram in 12/2011. EF 12/2012:  EF 50-55%, mild LVH, inferobasal HK, mild MR. ETT-Ses 5/14 EF 41%. Cardiac MRI 5/14: EF 44%, mild global HK, subepicardial delayed enhancement in nonspecific RV insertion pattern.  . H/O atrial flutter 2007    a. Ablations in 2007, 2008.  [name] History of medication noncompliance   . History of alcohol abuse   . 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 Heart murmur   . Anginal pain ([place])   . Sleep apnea     "suppose to send mask but they never did" (05/03/2015)  . Type II diabetes mellitus ([place])   . Left sciatic nerve pain since 04/2015  . Anxiety   . Cholecystitis   . Arrhythmia   . History of nuclear stress test     Myoview 3/17: + chest pain; EF 33%, downsloping ST depression 2, V4-6, LVH with strain, no ischemia on images; intermediate risk   . Shortness of breath dyspnea     with excertion  . History of hiatal hernia     Past Surgical History  Procedure Laterality Date  . Osteochondroma excision Left 1972    "took bone tumor off my shoulder"  . Coronary angioplasty  12/05/01  . A flutter ablation  2007, 2008    catheter ablation   . Left heart catheterization with coronary angiogram N/A 12/19/2011    Procedure: LEFT HEART CATHETERIZATION WITH CORONARY ANGIOGRAM;  Surgeon: Larey Dresser, MD;  Location: [facility] CATH LAB;  Service: Cardiovascular;  Laterality: N/A;  . Cardiac catheterization N/A 05/03/2015    Procedure: Right/Left Heart Cath and Coronary Angiography;  Surgeon: Larey Dresser, MD;  Location: [place] CV LAB;  Service: Cardiovascular;  Laterality: N/A;  . Cardiac catheterization N/A 05/03/2015    Procedure: Coronary Stent Intervention;  Surgeon: Jettie Booze, MD;  Location: [place] CV LAB;  Service: Cardiovascular;  Laterality: N/A;  . Cardioversion N/A 08/24/2015    Procedure: CARDIOVERSION;  Surgeon: Thayer Headings, MD;  Location: [place];  Service: Cardiovascular;  Laterality: N/A;  . Cardiac catheterization N/A 11/04/2015    Procedure: Left Heart Cath and Coronary Angiography;  Surgeon: Larey Dresser, MD;  Location: [place] CV LAB;  Service: Cardiovascular;  Laterality: N/A;    Family History  Problem Relation Age of Onset  . Diabetes Mother   . Hypertension Mother   . Heart attack Neg Hx   . Stroke Neg Hx    Social History:  reports that he quit smoking  about 32 years ago. His smoking use included Cigarettes. He has a 72 pack-year smoking history. He has never used smokeless tobacco. He reports that he drinks about 9.6 oz of alcohol per week. He reports that he uses illicit drugs (Marijuana).  Allergies:  Allergies  Allergen Reactions  . Ace Inhibitors Cough  . Other Hives and Other (See Comments)    Patient reports developing hives after receiving "some antibiotic given in 1980''s at [facility]". He does not know which antibiotic.    Medications Prior to Admission  Medication Sig Dispense Refill  . acetaminophen (TYLENOL) 500 MG tablet Take 1,000 mg by mouth every 6 (six) hours as needed (pain). Reported on 08/18/2015    . amLODipine (NORVASC) 5 MG tablet Take 1 tablet (5 mg total) by mouth daily. 90 tablet 3  . carvedilol (COREG) 12.5 MG tablet Take 1 tablet (12.5 mg total) by mouth 2 (two) times daily with a meal. 180 tablet 3  . clopidogrel (PLAVIX) 75 MG tablet  Take 1 tablet (75 mg total) by mouth daily with breakfast. 90 tablet 3  . furosemide (LASIX) 40 MG tablet TAKE ONE TABLET BY MOUTH IN THE MORNING AND ONE-HALF TABLET IN THE EVENING (Patient taking differently: Take 40 mg by mouth 2 (two) times daily as needed for fluid. ) 90 tablet 3  . hydrALAZINE (APRESOLINE) 50 MG tablet Take 1 tablet (50 mg total) by mouth 3 (three) times daily. 270 tablet 3  . Insulin Glargine (LANTUS SOLOSTAR) 100 UNIT/ML Solostar Pen Inject 50 Units into the skin every morning. And pen needles 1/day 30 mL 3  . metFORMIN (GLUCOPHAGE) 1000 MG tablet Take 1 tablet (1,000 mg total) by mouth 2 (two) times daily with a meal. 180 tablet 3  . nitroGLYCERIN (NITROSTAT) 0.4 MG SL tablet Place 0.4 mg under the tongue every 5 (five) minutes as needed for chest pain (x 3 tabs daily). Reported on 08/18/2015    . oxyCODONE-acetaminophen (ROXICET) 5-325 MG tablet Take 1 tablet by mouth every 4 (four) hours as needed for severe pain. 30 tablet 0  . potassium chloride  SA (K-DUR,KLOR-CON) 20 MEQ tablet Take 1 tablet (20 mEq total) by mouth daily. (Patient taking differently: Take 10 mEq by mouth every other day. ) 90 tablet 3  . spironolactone (ALDACTONE) 25 MG tablet Take 1 tablet (25 mg total) by mouth daily. 90 tablet 3  . warfarin (COUMADIN) 5 MG tablet Take as directed by coumadin clinic (Patient taking differently: Take 2.5-5 mg by mouth daily. Take 2.5mg  by mouth on Monday and Friday. Take 5 mg by mouth on all other days) 90 tablet 1  . atorvastatin (LIPITOR) 80 MG tablet Take 1 tablet (80 mg total) by mouth daily. 30 tablet 1    Results for orders placed or performed during the hospital encounter of 02/07/16 (from the past 48 hour(s))  Glucose, capillary     Status: Abnormal   Collection Time: 02/07/16  5:47 AM  Result Value Ref Range   Glucose-Capillary 136 (H) 65 - 99 mg/dL   Comment 1 Notify RN   Protime-INR     Status: None   Collection Time: 02/07/16  6:30 AM  Result Value Ref Range   Prothrombin Time 15.0 11.6 - 15.2 seconds   INR 1.16 0.00 - 1.49   No results found.  Review of Systems  All other systems reviewed and are negative.   Blood pressure 135/68, pulse 68, temperature 98.1 F (36.7 C), temperature source Oral, resp. rate 18, SpO2 100 %. Physical Exam  Vitals reviewed. Constitutional: He is oriented to person, place, and time. He appears well-developed and well-nourished. No distress.  HENT:  Head: Normocephalic and atraumatic.  Right Ear: External ear normal.  Left Ear: External ear normal.  Eyes: Conjunctivae are normal. No scleral icterus.  Neck: Normal range of motion. Neck supple. No tracheal deviation present. No thyromegaly present.  Cardiovascular: Normal rate and normal heart sounds.   Respiratory: Effort normal and breath sounds normal. No stridor. No respiratory distress. He has no wheezes.  GI: Soft. He exhibits no distension. There is no tenderness. There is no rebound.  Gb drain  Musculoskeletal: He  exhibits no edema or tenderness.  Lymphadenopathy:    He has no cervical adenopathy.  Neurological: He is alert and oriented to person, place, and time. He exhibits normal muscle tone.  Skin: Skin is warm and dry. No rash noted. He is not diaphoretic. No erythema. No pallor.  Psychiatric: He has a normal mood and affect.  His behavior is normal. Judgment and thought content normal.     Assessment/Plan Chronic cholecystitis with calculous with GB drain CAD PAF Chronic anticoag CKD Chronic diastolic CHF  Stopped both blood thinners 6 days ago No questions To OR for interval lap chole  Leighton Ruff. Redmond Pulling, MD, [place], Bariatric, & Minimally Invasive Surgery [facility] Surgery, [place]   Gayland Curry, MD 02/07/2016, 7:23 AM

## 2016-02-07 NOTE — Transfer of Care (Signed)
Immediate Anesthesia Transfer of Care Note  Patient: [name] [name]  Procedure(s) Performed: Procedure(s): LAPAROSCOPIC CHOLECYSTECTOMY WITH POSSIBLE INTRAOPERATIVE CHOLANGIOGRAM (N/A)  Patient Location: PACU  Anesthesia Type:General  Level of Consciousness: sedated and patient cooperative  Airway & Oxygen Therapy: Patient Spontanous Breathing and Patient connected to face mask oxygen  Post-op Assessment: Report given to RN and Post -op Vital signs reviewed and stable  Post vital signs: Reviewed and stable  Last Vitals:  Filed Vitals:   02/07/16 0549  BP: 135/68  Pulse: 68  Temp: 36.7 C  Resp: 18    Last Pain:  Filed Vitals:   02/07/16 0640  PainSc: 0-No pain         Complications: No apparent anesthesia complications

## 2016-02-07 NOTE — Progress Notes (Signed)
May cover patient  With Sliding Scale Insulin coverage in PACU written by Dr. Redmond Pulling-  Then recheck CBG- per Dr. Seward Speck

## 2016-02-07 NOTE — Anesthesia Postprocedure Evaluation (Signed)
Anesthesia Post Note  Patient: [name] [name]  Procedure(s) Performed: Procedure(s) (LRB): LAPAROSCOPIC CHOLECYSTECTOMY WITH POSSIBLE INTRAOPERATIVE CHOLANGIOGRAM (N/A)  Patient location during evaluation: PACU Anesthesia Type: General Level of consciousness: sedated Pain management: satisfactory to patient Vital Signs Assessment: post-procedure vital signs reviewed and stable Respiratory status: spontaneous breathing Cardiovascular status: stable Anesthetic complications: no    Last Vitals:  Filed Vitals:   02/07/16 1348 02/07/16 1447  BP: 170/90 171/107  Pulse: 87 91  Temp: 36.8 C 36.7 C  Resp: 18 18    Last Pain:  Filed Vitals:   02/07/16 1447  PainSc: [place]

## 2016-02-07 NOTE — Anesthesia Procedure Notes (Signed)
Procedure Name: Intubation Date/Time: 02/07/2016 7:37 AM Performed by: Dione Booze Pre-anesthesia Checklist: Emergency Drugs available, Suction available, Patient being monitored and Patient identified Patient Re-evaluated:Patient Re-evaluated prior to inductionOxygen Delivery Method: Circle system utilized Preoxygenation: Pre-oxygenation with 100% oxygen Intubation Type: IV induction Laryngoscope Size: Mac and 4 Grade View: Grade II Tube type: Oral Tube size: 7.5 mm Number of attempts: 1 Airway Equipment and Method: Stylet Placement Confirmation: ETT inserted through vocal cords under direct vision and positive ETCO2 Secured at: 22 cm Tube secured with: Tape Dental Injury: Teeth and Oropharynx as per pre-operative assessment

## 2016-02-07 NOTE — Progress Notes (Signed)
Order for cardiac monitoring clarified with Dr. Redmond Pulling via phone. Md aware pt recently arrived to [place] received to dc cardiac monitoring.

## 2016-02-08 ENCOUNTER — Encounter (HOSPITAL_COMMUNITY): Payer: Medicare Other

## 2016-02-08 DIAGNOSIS — I2510 Atherosclerotic heart disease of native coronary artery without angina pectoris: Secondary | ICD-10-CM | POA: Diagnosis not present

## 2016-02-08 DIAGNOSIS — G4730 Sleep apnea, unspecified: Secondary | ICD-10-CM | POA: Diagnosis not present

## 2016-02-08 DIAGNOSIS — I130 Hypertensive heart and chronic kidney disease with heart failure and stage 1 through stage 4 chronic kidney disease, or unspecified chronic kidney disease: Secondary | ICD-10-CM | POA: Diagnosis not present

## 2016-02-08 DIAGNOSIS — I480 Paroxysmal atrial fibrillation: Secondary | ICD-10-CM | POA: Diagnosis not present

## 2016-02-08 DIAGNOSIS — I5032 Chronic diastolic (congestive) heart failure: Secondary | ICD-10-CM | POA: Diagnosis not present

## 2016-02-08 DIAGNOSIS — K8012 Calculus of gallbladder with acute and chronic cholecystitis without obstruction: Secondary | ICD-10-CM | POA: Diagnosis not present

## 2016-02-08 LAB — CBC
HCT: 40.8 % (ref 39.0–52.0)
Hemoglobin: 13.9 g/dL (ref 13.0–17.0)
MCH: 27.7 pg (ref 26.0–34.0)
MCHC: 34.1 g/dL (ref 30.0–36.0)
MCV: 81.4 fL (ref 78.0–100.0)
PLATELETS: 188 10*3/uL (ref 150–400)
RBC: 5.01 MIL/uL (ref 4.22–5.81)
RDW: 15.2 % (ref 11.5–15.5)
WBC: 14.3 10*3/uL — AB (ref 4.0–10.5)

## 2016-02-08 LAB — BASIC METABOLIC PANEL
Anion gap: 7 (ref 5–15)
BUN: 15 mg/dL (ref 6–20)
CALCIUM: 9 mg/dL (ref 8.9–10.3)
CHLORIDE: 107 mmol/L (ref 101–111)
CO2: 23 mmol/L (ref 22–32)
CREATININE: 1.13 mg/dL (ref 0.61–1.24)
GFR calc non Af Amer: >60 mL/min (ref >60)
Glucose, Bld: 118 mg/dL — ABNORMAL HIGH (ref 65–99)
Potassium: 4.1 mmol/L (ref 3.5–5.1)
SODIUM: 137 mmol/L (ref 135–145)

## 2016-02-08 LAB — GLUCOSE, CAPILLARY: GLUCOSE-CAPILLARY: 140 mg/dL — AB (ref 65–99)

## 2016-02-08 MED ORDER — OXYCODONE HCL 5 MG PO TABS: 5.0000 mg | ORAL_TABLET | ORAL | Status: DC | PRN

## 2016-02-08 MED ORDER — CLOPIDOGREL BISULFATE 75 MG PO TABS: 75.0000 mg | ORAL_TABLET | Freq: Every day | ORAL | Status: DC

## 2016-02-08 NOTE — Progress Notes (Signed)
Patient alert and oriented with pain controlled. Patient received discharge instructions and prescriptions. All questions answered. Patient verbalized understanding of discharge instructions.

## 2016-02-08 NOTE — Discharge Summary (Signed)
Physician Discharge Summary  [name] [name] [id number] DOB: 1955-08-24 DOA: 02/07/2016  PCP: Angelica Chessman, MD  Admit date: 02/07/2016 Discharge date: 02/08/2016  Recommendations for Outpatient Follow-up:   Follow-up Information    Follow up with Gayland Curry, MD. Schedule an appointment as soon as possible for a visit on 02/24/2016.   Specialty:  General Surgery   Why:  3:30 pm (pls arrive at 3:15 pm)    Contact information:   [address] [place] [address] [phone]       Follow up with [facility]. Schedule an appointment as soon as possible for a visit in 6 days.   Specialty:  Cardiology   Why:  FOR INR/COUMADIN CHECK   Contact information:   [address], [place] [phone]     Discharge Diagnoses:  Chronic cholecystitis with calculous HTN CAD Chronic diastolic CHF Chronic anticoagulation DM 2 PAF  Surgical Procedure: laparoscopic cholecystectomy with IOC   Discharge Condition: good Disposition: home  Diet recommendation: diabetic  Filed Weights   02/07/16 1141  Weight: 97.523 kg (215 lb)   Hospital Course:  He came in for planned interval lap chole after undergoing cholecystostomy tube placement in fall 2016 for cholecystitis. He was given permission to his blood thinners for surgery - was delayed due to DES stent. He did well. Kept overnight. No complaints on POD 1 other than not much sleep. No cp/sob/n/v. Tolerated a diet. Ambulated. Pain controlled. Discussed dc instructions and resumption of oral anticoagulants and need to monitor coumadin level.   BP 114/72 mmHg  Pulse 94  Temp(Src) 98.9 F (37.2 C) (Oral)  Resp 18  Ht 6' (1.829 m)  Wt 97.523 kg (215 lb)  BMI 29.15 kg/m2  SpO2 91%  Gen: alert, NAD, non-toxic appearing Pupils: equal, no scleral icterus Pulm: Lungs clear to auscultation, symmetric chest rise CV: regular rate and rhythm Abd: soft,  nontender, nondistended. Incisions c/d/i No cellulitis. No incisional hernia Ext: no edema, no calf tenderness Skin: no rash, no jaundice    Discharge Instructions  Discharge Instructions    Call MD for:  hives    Complete by:  As directed      Call MD for:  persistant dizziness or light-headedness    Complete by:  As directed      Call MD for:  persistant nausea and vomiting    Complete by:  As directed      Call MD for:  redness, tenderness, or signs of infection (pain, swelling, redness, odor or green/yellow discharge around incision site)    Complete by:  As directed      Call MD for:  severe uncontrolled pain    Complete by:  As directed      Call MD for:    Complete by:  As directed   Temperature >101     Diet - low sodium heart healthy    Complete by:  As directed      Discharge instructions    Complete by:  As directed   See [facility] discharge instructions RESUME PLAVIX AND COUMADIN ON Thursday June 29 YOU MUST HAVE YOUR INR LEVEL CHECKED NEXT WEEK     Increase activity slowly    Complete by:  As directed             Medication List    STOP taking these medications        oxyCODONE-acetaminophen 5-325 MG tablet  Commonly  known as:  ROXICET      TAKE these medications        acetaminophen 500 MG tablet  Commonly known as:  TYLENOL  Take 1,000 mg by mouth every 6 (six) hours as needed (pain). Reported on 08/18/2015     amLODipine 5 MG tablet  Commonly known as:  NORVASC  Take 1 tablet (5 mg total) by mouth daily.     atorvastatin 80 MG tablet  Commonly known as:  LIPITOR  Take 1 tablet (80 mg total) by mouth daily.     carvedilol 12.5 MG tablet  Commonly known as:  COREG  Take 1 tablet (12.5 mg total) by mouth 2 (two) times daily with a meal.     clopidogrel 75 MG tablet  Commonly known as:  PLAVIX  Take 1 tablet (75 mg total) by mouth daily with breakfast.  Start taking on:  02/09/2016     furosemide 40 MG tablet  Commonly known as:  LASIX  TAKE ONE  TABLET BY MOUTH IN THE MORNING AND ONE-HALF TABLET IN THE EVENING     hydrALAZINE 50 MG tablet  Commonly known as:  APRESOLINE  Take 1 tablet (50 mg total) by mouth 3 (three) times daily.     Insulin Glargine 100 UNIT/ML Solostar Pen  Commonly known as:  LANTUS SOLOSTAR  Inject 50 Units into the skin every morning. And pen needles 1/day     metFORMIN 1000 MG tablet  Commonly known as:  GLUCOPHAGE  Take 1 tablet (1,000 mg total) by mouth 2 (two) times daily with a meal.     nitroGLYCERIN 0.4 MG SL tablet  Commonly known as:  NITROSTAT  Place 0.4 mg under the tongue every 5 (five) minutes as needed for chest pain (x 3 tabs daily). Reported on 08/18/2015     oxyCODONE 5 MG immediate release tablet  Commonly known as:  Oxy IR/ROXICODONE  Take 1-2 tablets (5-10 mg total) by mouth every 4 (four) hours as needed for moderate pain.     potassium chloride SA 20 MEQ tablet  Commonly known as:  K-DUR,KLOR-CON  Take 1 tablet (20 mEq total) by mouth daily.     spironolactone 25 MG tablet  Commonly known as:  ALDACTONE  Take 1 tablet (25 mg total) by mouth daily.     warfarin 5 MG tablet  Commonly known as:  COUMADIN  Take as directed by coumadin clinic           Follow-up Information    Follow up with Gayland Curry, MD. Schedule an appointment as soon as possible for a visit on 02/24/2016.   Specialty:  General Surgery   Why:  3:30 pm (pls arrive at 3:15 pm)    Contact information:   [address] [place] [address] [phone]       Follow up with [facility]. Schedule an appointment as soon as possible for a visit in 6 days.   Specialty:  Cardiology   Why:  FOR INR/COUMADIN CHECK   Contact information:   [address] [place] [phone]       The results of significant diagnostics from this hospitalization (including imaging, microbiology, ancillary and laboratory) are listed below for  reference.    Significant Diagnostic Studies: Dg Chest 2 View  01/13/2016  CLINICAL DATA:  Acute onset of right-sided chest pain. Mild shortness of breath. Initial encounter. EXAM: CHEST  2 VIEW COMPARISON:  Chest radiograph from 08/22/2015 FINDINGS: The lungs are well-aerated. Vascular congestion is noted. Peribronchial thickening is seen. Mild left basilar atelectasis is noted. There is no evidence of pleural effusion or pneumothorax. The heart is borderline normal in size. No acute osseous abnormalities are seen. IMPRESSION: Vascular congestion noted. Peribronchial thickening seen. Mild left basilar atelectasis noted. Electronically Signed   By: Garald Balding M.D.   On: 01/13/2016 02:35   Dg Cholangiogram Operative  02/07/2016  CLINICAL DATA:  Cholecystectomy for cholelithiasis and prior cholecystitis requiring placement of a cholecystostomy tube. EXAM: INTRAOPERATIVE CHOLANGIOGRAM TECHNIQUE: Cholangiographic images from the C-arm fluoroscopic device were submitted for interpretation post-operatively. Please see the procedural report for the amount of contrast and the fluoroscopy time utilized. COMPARISON:  Cholangiogram on 07/19/2015 FINDINGS: Intraoperative cholangiogram demonstrates a normal caliber opacified biliary tree without evidence of filling defect or obstruction. Contrast enters the duodenum. No contrast extravasation identified. IMPRESSION: Normal intraoperative cholangiogram. Electronically Signed   By: Aletta Edouard M.D.   On: 02/07/2016 10:51   Ct Chest W Contrast  01/13/2016  CLINICAL DATA:  Chest pain. Right upper quadrant pain. Biliary drain in place for 6 months. EXAM: CT CHEST, ABDOMEN, AND PELVIS WITH CONTRAST TECHNIQUE: Multidetector CT imaging of the chest, abdomen and pelvis was performed following the standard protocol during bolus administration of intravenous contrast. CONTRAST:  134mL ISOVUE-300 IOPAMIDOL (ISOVUE-300) INJECTION 61% COMPARISON:  CT abdomen 07/19/2015  FINDINGS: CT CHEST FINDINGS Mediastinum/Lymph Nodes: Mild atherosclerosis of thoracic aorta. Coronary artery calcifications versus stents. No hilar or mediastinal adenopathy. Small mediastinal lymph nodes not enlarged by size criteria, for example lower paratracheal node measures 7 mm. No pericardial effusion. Lungs/Pleura: Minimal scattered atelectasis. No consolidation to suggest pneumonia. Minimal smooth septal thickening. No pulmonary mass or suspicious nodule. No pleural effusion. Musculoskeletal: There are no acute or suspicious osseous abnormalities. Degenerative disc disease in the thoracic spine. CT ABDOMEN PELVIS FINDINGS Hepatobiliary: Percutaneous cholecystostomy tube in place. Gallbladder is decompressed. Gallstones are noted including a gallstone at the neck. Gallbladder wall thickening measuring 6 mm. No pericholecystic inflammation. No biliary dilatation. Small hyper attenuating lesion in segment 3, previous MRI characterization consistent with hemangioma. Mild steatosis. No intrahepatic collection or new focal lesion. Pancreas: No ductal dilatation or inflammation. Spleen: Normal in size. Adrenals/Urinary Tract: No adrenal nodule. No hydronephrosis. Cysts in the upper left kidney, previously characterized by MRI in April 2015 as Bosniak 2 cyst. No new renal lesion. No perinephric edema. Symmetric enhancement and excretion on delayed phase imaging. Bladder minimally distended. No bladder wall thickening. Stomach/Bowel: Small hiatal hernia. Stomach is decompressed. No duodenum wall thickening. There are no dilated or thickened bowel loops. Minimal colonic diverticulosis without diverticulitis. Small to moderate stool burden. The appendix is normal. Vascular/Lymphatic: Atherosclerosis and tortuosity of normal caliber thoracic aorta. No retroperitoneal adenopathy. IVC is patent. Reproductive: Prominent sized prostate gland. Other: No free air, or free fluid or intra-abdominal fluid collection. Fat  within both inguinal canals. Musculoskeletal: Bilateral L5 pars interarticularis defects with grade 1 anterolisthesis. Degenerative disc disease at L3-L4. There are no acute or suspicious osseous abnormalities. Probable prior musculotendinous injury at the pubic symphyseal insertion. IMPRESSION: 1. Percutaneous cholecystostomy tube in place. Intraluminal gallstones including a gallstone at the gallbladder neck. Mild gallbladder wall thickening may be due to nondistention, no pericholecystic edema. 2. No additional acute abnormality in the chest, abdomen, or pelvis. 3. Additional chronic findings are unchanged. Electronically Signed   By: Jeb Levering M.D.   On: 01/13/2016 04:46   Ct Abdomen Pelvis W  Contrast  01/13/2016  CLINICAL DATA:  Chest pain. Right upper quadrant pain. Biliary drain in place for 6 months. EXAM: CT CHEST, ABDOMEN, AND PELVIS WITH CONTRAST TECHNIQUE: Multidetector CT imaging of the chest, abdomen and pelvis was performed following the standard protocol during bolus administration of intravenous contrast. CONTRAST:  166mL ISOVUE-300 IOPAMIDOL (ISOVUE-300) INJECTION 61% COMPARISON:  CT abdomen 07/19/2015 FINDINGS: CT CHEST FINDINGS Mediastinum/Lymph Nodes: Mild atherosclerosis of thoracic aorta. Coronary artery calcifications versus stents. No hilar or mediastinal adenopathy. Small mediastinal lymph nodes not enlarged by size criteria, for example lower paratracheal node measures 7 mm. No pericardial effusion. Lungs/Pleura: Minimal scattered atelectasis. No consolidation to suggest pneumonia. Minimal smooth septal thickening. No pulmonary mass or suspicious nodule. No pleural effusion. Musculoskeletal: There are no acute or suspicious osseous abnormalities. Degenerative disc disease in the thoracic spine. CT ABDOMEN PELVIS FINDINGS Hepatobiliary: Percutaneous cholecystostomy tube in place. Gallbladder is decompressed. Gallstones are noted including a gallstone at the neck. Gallbladder wall  thickening measuring 6 mm. No pericholecystic inflammation. No biliary dilatation. Small hyper attenuating lesion in segment 3, previous MRI characterization consistent with hemangioma. Mild steatosis. No intrahepatic collection or new focal lesion. Pancreas: No ductal dilatation or inflammation. Spleen: Normal in size. Adrenals/Urinary Tract: No adrenal nodule. No hydronephrosis. Cysts in the upper left kidney, previously characterized by MRI in April 2015 as Bosniak 2 cyst. No new renal lesion. No perinephric edema. Symmetric enhancement and excretion on delayed phase imaging. Bladder minimally distended. No bladder wall thickening. Stomach/Bowel: Small hiatal hernia. Stomach is decompressed. No duodenum wall thickening. There are no dilated or thickened bowel loops. Minimal colonic diverticulosis without diverticulitis. Small to moderate stool burden. The appendix is normal. Vascular/Lymphatic: Atherosclerosis and tortuosity of normal caliber thoracic aorta. No retroperitoneal adenopathy. IVC is patent. Reproductive: Prominent sized prostate gland. Other: No free air, or free fluid or intra-abdominal fluid collection. Fat within both inguinal canals. Musculoskeletal: Bilateral L5 pars interarticularis defects with grade 1 anterolisthesis. Degenerative disc disease at L3-L4. There are no acute or suspicious osseous abnormalities. Probable prior musculotendinous injury at the pubic symphyseal insertion. IMPRESSION: 1. Percutaneous cholecystostomy tube in place. Intraluminal gallstones including a gallstone at the gallbladder neck. Mild gallbladder wall thickening may be due to nondistention, no pericholecystic edema. 2. No additional acute abnormality in the chest, abdomen, or pelvis. 3. Additional chronic findings are unchanged. Electronically Signed   By: Jeb Levering M.D.   On: 01/13/2016 04:46    Microbiology: No results found for this or any previous visit (from the past 240 hour(s)).   Labs: Basic  Metabolic Panel:  Recent Labs Lab 02/03/16 1405 02/08/16 0439  NA 137 137  K 3.8 4.1  CL 105 107  CO2 23 23  GLUCOSE 136* 118*  BUN 13 15  CREATININE 1.25* 1.13  CALCIUM 9.5 9.0   Liver Function Tests:  Recent Labs Lab 02/03/16 1405  AST 20  ALT 14*  ALKPHOS 54  BILITOT 0.9  PROT 8.0  ALBUMIN 4.3   No results for input(s): LIPASE, AMYLASE in the last 168 hours. No results for input(s): AMMONIA in the last 168 hours. CBC:  Recent Labs Lab 02/03/16 1405 02/08/16 0439  WBC 6.0 14.3*  NEUTROABS 3.0  --   HGB 13.7 13.9  HCT 40.7 40.8  MCV 80.9 81.4  PLT 187 188   Cardiac Enzymes: No results for input(s): CKTOTAL, CKMB, CKMBINDEX, TROPONINI in the last 168 hours. BNP: BNP (last 3 results)  Recent Labs  08/22/15 1609 10/12/15 1039  BNP 331.0*  45.5    ProBNP (last 3 results)  Recent Labs  04/22/15 1559  PROBNP 59.0    CBG:  Recent Labs Lab 02/07/16 1019 02/07/16 1207 02/07/16 1726 02/07/16 2211 02/08/16 0752  GLUCAP 188* 128* 112* 163* 140*    Active Problems:   S/P laparoscopic cholecystectomy   Time coordinating discharge: 20 min  Signed:  Gayland Curry, MD [facility], [place] [phone] 02/08/2016, 8:20 AM

## 2016-02-08 NOTE — Discharge Instructions (Signed)
RESUME PLAVIX AND COUMADIN ON Thursday 6/29 YOU NEED TO HAVE YOUR COUMADIN/INR LEVEL CHECKED NEXT WEEK  [facility] LAPAROSCOPIC SURGERY: POST OP INSTRUCTIONS Always review your discharge instruction sheet given to you by the facility where your surgery was performed. IF YOU HAVE DISABILITY OR FAMILY LEAVE FORMS, YOU MUST BRING THEM TO THE OFFICE FOR PROCESSING.   DO NOT GIVE THEM TO YOUR DOCTOR.  1. A prescription for pain medication may be given to you upon discharge.  Take your pain medication as prescribed, if needed.  If narcotic pain medicine is not needed, then you may take acetaminophen (Tylenol) or ibuprofen (Advil) as needed. 2. Take your usually prescribed medications unless otherwise directed. 3. If you need a refill on your pain medication, please contact your pharmacy.  They will contact our office to request authorization. Prescriptions will not be filled after 5pm or on week-ends. 4. You should follow a light diet the first few days after arrival home, such as soup and crackers, etc.  Be sure to include lots of fluids daily. 5. Most patients will experience some swelling and bruising in the area of the incisions.  Ice packs will help.  Swelling and bruising can take several days to resolve.  6. It is common to experience some constipation if taking pain medication after surgery.  Increasing fluid intake and taking a stool softener (such as Colace) will usually help or prevent this problem from occurring.  A mild laxative (Milk of Magnesia or Miralax) should be taken according to package instructions if there are no bowel movements after 48 hours. 7. Unless discharge instructions indicate otherwise, you may remove your bandages 24-48 hours after surgery, and you may shower at that time.  You may have steri-strips (small skin tapes) in place directly over the incision.  These strips should be left on the skin for 7-10 days.  If your surgeon used skin glue on the  incision, you may shower in 24 hours.  The glue will flake off over the next 2-3 weeks.  Any sutures or staples will be removed at the office during your follow-up visit. 8. ACTIVITIES:  You may resume regular (light) daily activities beginning the next day--such as daily self-care, walking, climbing stairs--gradually increasing activities as tolerated.  You may have sexual intercourse when it is comfortable.  Refrain from any heavy lifting or straining until approved by your doctor. a. You may drive when you are no longer taking prescription pain medication, you can comfortably wear a seatbelt, and you can safely maneuver your car and apply brakes. 9. You should see your doctor in the office for a follow-up appointment approximately 2-3 weeks after your surgery.  Make sure that you call for this appointment within a day or two after you arrive home to insure a convenient appointment time. 10. OTHER INSTRUCTIONS:  WHEN TO CALL YOUR DOCTOR: 1. Fever over 101.0 2. Inability to urinate 3. Continued bleeding from incision. 4. Increased pain, redness, or drainage from the incision. 5. Increasing abdominal pain  The clinic staff is available to answer your questions during regular business hours.  Please dont hesitate to call and ask to speak to one of the nurses for clinical concerns.  If you have a medical emergency, go to the nearest emergency room or call 911.  A surgeon from [facility] is always on call at the hospital. [address] [place], [place], [place]  [address] ? [address] [address], [place], [place]  [address] [phone] ? [phone] ? FAX [phone] Web site: [email or web address]

## 2016-02-10 ENCOUNTER — Encounter (HOSPITAL_COMMUNITY): Payer: Medicare Other

## 2016-02-13 ENCOUNTER — Encounter (HOSPITAL_COMMUNITY): Payer: Medicare Other

## 2016-02-15 ENCOUNTER — Ambulatory Visit (INDEPENDENT_AMBULATORY_CARE_PROVIDER_SITE_OTHER): Payer: Medicare Other | Admitting: Pharmacist

## 2016-02-15 ENCOUNTER — Encounter (HOSPITAL_COMMUNITY): Payer: Medicare Other

## 2016-02-15 DIAGNOSIS — Z5181 Encounter for therapeutic drug level monitoring: Secondary | ICD-10-CM

## 2016-02-15 DIAGNOSIS — I4891 Unspecified atrial fibrillation: Secondary | ICD-10-CM | POA: Diagnosis not present

## 2016-02-15 DIAGNOSIS — Z7901 Long term (current) use of anticoagulants: Secondary | ICD-10-CM | POA: Diagnosis not present

## 2016-02-15 LAB — POCT INR: INR: 1.6

## 2016-02-17 ENCOUNTER — Encounter (HOSPITAL_COMMUNITY): Payer: Medicare Other

## 2016-02-20 ENCOUNTER — Encounter (HOSPITAL_COMMUNITY): Admission: RE | Admit: 2016-02-20 | Payer: Medicare Other | Source: Ambulatory Visit

## 2016-02-20 ENCOUNTER — Telehealth (HOSPITAL_COMMUNITY): Payer: Self-pay | Admitting: *Deleted

## 2016-02-20 NOTE — Telephone Encounter (Signed)
Unable to leave message on pt phone - mailbox full. Called and spoke to pt daughter- Mariel Craft.  Message given to pt daughter to relay to pt.  Asked about general well being after gallbladder surgery.  Pt to see Dr. Redmond Pulling on follow up on Friday.  Advised pt daughter that surgical clearance will be needed for pt to return to exercise.  Daughter verbalized understanding. Cherre Huger, BSN

## 2016-02-22 ENCOUNTER — Encounter (HOSPITAL_COMMUNITY): Payer: Medicare Other

## 2016-02-24 ENCOUNTER — Telehealth (HOSPITAL_COMMUNITY): Payer: Self-pay | Admitting: *Deleted

## 2016-02-24 ENCOUNTER — Encounter (HOSPITAL_COMMUNITY): Payer: Medicare Other

## 2016-02-24 NOTE — Telephone Encounter (Signed)
-----   Message from Greer Pickerel, MD sent at 02/24/2016  8:47 AM EDT ----- Regarding: RE: clearance to resume exercise at cardiac rehab No restrictions. Full activities   Thanks Leighton Ruff. Redmond Pulling, MD, FACS General, Bariatric, & Minimally Invasive Surgery [facility]  ----- Message -----    From: Rowe Pavy, RN    Sent: 02/20/2016   9:23 AM      To: Greer Pickerel, MD Subject: clearance to resume exercise at cardiac rehab  Dr. Redmond Pulling  The above pt participation in cardiac rehab s/p stent placement is on hold due to his lap cholecystectomy.  Pt has follow up appt on this coming Friday 7/14.  Based upon your assessment, please indicate when pt may resume exercise (mondays wednesdays and Fridays) and any restrictions of activity ( track stationary bike and recumbent stepper) or hand weight limitations ( mondays and fridays).  Thanks for the input [employer] RN

## 2016-02-27 ENCOUNTER — Encounter (HOSPITAL_COMMUNITY)
Admission: RE | Admit: 2016-02-27 | Discharge: 2016-02-27 | Disposition: A | Discharge status: Home / Self Care | Payer: Medicare Other | Source: Ambulatory Visit | Attending: Cardiology | Admitting: Cardiology

## 2016-02-27 DIAGNOSIS — I509 Heart failure, unspecified: Secondary | ICD-10-CM | POA: Insufficient documentation

## 2016-02-27 DIAGNOSIS — Z955 Presence of coronary angioplasty implant and graft: Secondary | ICD-10-CM | POA: Diagnosis not present

## 2016-02-27 LAB — GLUCOSE, CAPILLARY: Glucose-Capillary: 228 mg/dL — ABNORMAL HIGH (ref 65–99)

## 2016-02-27 NOTE — Progress Notes (Signed)
Pt returned to exercise after his lap cholecystectomy.  Pt tolerated exercise well with no complaints. Cherre Huger, BSN

## 2016-02-29 ENCOUNTER — Encounter (HOSPITAL_COMMUNITY)
Admission: RE | Admit: 2016-02-29 | Discharge: 2016-02-29 | Disposition: A | Discharge status: Home / Self Care | Payer: Medicare Other | Source: Ambulatory Visit | Attending: Cardiology | Admitting: Cardiology

## 2016-02-29 ENCOUNTER — Other Ambulatory Visit: Payer: Self-pay | Admitting: *Deleted

## 2016-02-29 DIAGNOSIS — Z955 Presence of coronary angioplasty implant and graft: Secondary | ICD-10-CM | POA: Diagnosis not present

## 2016-02-29 DIAGNOSIS — I2510 Atherosclerotic heart disease of native coronary artery without angina pectoris: Secondary | ICD-10-CM

## 2016-02-29 DIAGNOSIS — I509 Heart failure, unspecified: Secondary | ICD-10-CM | POA: Diagnosis not present

## 2016-02-29 LAB — GLUCOSE, CAPILLARY: Glucose-Capillary: 191 mg/dL — ABNORMAL HIGH (ref 65–99)

## 2016-02-29 MED ORDER — NITROGLYCERIN 0.4 MG SL SUBL: 0.4000 mg | SUBLINGUAL_TABLET | SUBLINGUAL | Status: DC | PRN

## 2016-02-29 NOTE — Progress Notes (Signed)
Discharge Summary  Patient Details  Name: [name] [name] MRN: [id number] Date of Birth: 09/12/1955 Referring Provider:   Flowsheet Row CARDIAC REHAB PHASE II EXERCISE from 11/23/2015 in [place]  Referring Provider  Loralie Champagne MD       Number of Visits: 26 Reason for Discharge:  Early Exit:  Insurance  Smoking History:  History  Smoking Status  . Former Smoker  . Packs/day: 4.00  . Years: 18.00  . Types: Cigarettes  . Quit date: 08/14/1983  Smokeless Tobacco  . Never Used    Diagnosis:  Stented coronary artery  S/P coronary artery stent placement  ADL UCSD:   Initial Exercise Prescription:     Initial Exercise Prescription - 11/24/15 1400      Date of Initial Exercise RX and Referring Provider   Date 10/27/15   Referring Provider Loralie Champagne MD     Bike   Level 1.2  was reduced to 0.6 due to HTN   Minutes 10   METs 3.31     NuStep   Level 3   Minutes 10   METs 1.6     Track   Laps 14   Minutes 10   METs 3.45     Resistance Training   Training Prescription Yes   Weight 3 lbs   Reps 10-12      Discharge Exercise Prescription (Final Exercise Prescription Changes):     Exercise Prescription Changes - 03/02/16 1300      Exercise Review   Progression Yes     Response to Exercise   Blood Pressure (Admit) 138/74   Blood Pressure (Exercise) 162/70   Blood Pressure (Exit) 128/60   Heart Rate (Admit) 91 bpm   Heart Rate (Exercise) 121 bpm   Heart Rate (Exit) 91 bpm   Rating of Perceived Exertion (Exercise) 12   Comments Home Exercise Given 11/25/15   Duration Progress to 30 minutes of continuous aerobic without signs/symptoms of physical distress   Intensity THRR unchanged     Progression   Progression Continue to progress workloads to maintain intensity without signs/symptoms of physical distress.   Average METs 4.1     Resistance Training   Training Prescription Yes   Weight 5lbs.   Reps 10-12      Interval Training   Interval Training No     Bike   Level 1.5   Minutes 10   METs 3.9     NuStep   Level 4   Minutes 10   METs 4.6     Track   Laps 16   Minutes 10   METs 3.78     Home Exercise Plan   Plans to continue exercise at [facility] (comment)   Frequency Add 2 additional days to program exercise sessions.      Functional Capacity:     6 Minute Walk    Row Name 10/27/15 0810 02/27/16 1247       6 Minute Walk   Phase Initial Discharge    Distance 1838 feet 2000 feet    Distance % Change  - 8.81 %    Walk Time 6 minutes 6 minutes    # of Rest Breaks 0 0    MPH 3.48 3.79    METS 4.76 5.05    RPE 13 8    VO2 Peak 16.67 17.69    Symptoms Yes (comment) No    Comments chest pain 6/10, no medication yet this morning  -  Resting HR 83 bpm 84 bpm    Resting BP 124/60 132/60    Max Ex. HR 116 bpm 120 bpm    Max Ex. BP 178/104 168/80    2 Minute Post BP 148/100  130/92  -      Pre/Post BP   Baseline BP 124/60 132/60    6 Minute BP 178/104 168/80    Pre/Post BP? Yes  -       Psychological, QOL, Others - Outcomes: PHQ 2/9: Depression screen [facility] 2/9 02/29/2016 02/29/2016 01/26/2016 12/29/2015 11/11/2015  Decreased Interest 0 0 0 1 0  Down, Depressed, Hopeless 1 0 0 0 0  PHQ - 2 Score 1 0 0 1 0  Altered sleeping - - - 1 -  Tired, decreased energy - - - 1 -  Change in appetite - - - 1 -  Feeling bad or failure about yourself  - - - 1 -  Trouble concentrating - - - 0 -  Moving slowly or fidgety/restless - - - 0 -  Suicidal thoughts - - - 0 -  PHQ-9 Score - - - 5 -  Some recent data might be hidden    Quality of Life:     Quality of Life - 02/29/16 1251      Quality of Life Scores   Health/Function Pre 19.77 %   Health/Function Post 16.93 %   Health/Function % Change -14.37 %   Socioeconomic Pre 20.42 %   Socioeconomic Post 16 %   Socioeconomic % Change  -21.65 %   Psych/Spiritual Pre 18.93 %   Psych/Spiritual Post 18.86 %    Psych/Spiritual % Change -0.37 %   Family Pre 22.8 %   Family Post 25 %   Family % Change 9.65 %   GLOBAL Pre 20.17 %   GLOBAL Post 18.32 %   GLOBAL % Change -9.17 %      Personal Goals: Goals established at orientation with interventions provided to work toward goal.     Personal Goals and Risk Factors at Admission - 10/27/15 0752      Core Components/Risk Factors/Patient Goals on Admission    Weight Management Yes   Intervention Weight Management: Develop a combined nutrition and exercise program designed to reach desired caloric intake, while maintaining appropriate intake of nutrient and fiber, sodium and fats, and appropriate energy expenditure required for the weight goal.;Weight Management: Provide education and appropriate resources to help participant work on and attain dietary goals.   Admit Weight 218 lb 7.6 oz (99.1 kg)   Goal Weight: Short Term 209 lb 7 oz (95 kg)   Goal Weight: Long Term 200 lb (90.7 kg)   Expected Outcomes Long Term: Adherence to nutrition and physical activity/exercise program aimed toward attainment of established weight goal.;Short Term: Continue to assess and modify interventions until short term weight is achieved.   Sedentary Yes  Increase stamina and cardio capacity   Intervention Provide advice, education, support and counseling about physical activity/exercise needs.;Develop an individualized exercise prescription for aerobic and resistive training based on initial evaluation findings, risk stratification, comorbidities and participant's personal goals.   Expected Outcomes Achievement of increased cardiorespiratory fitness and enhanced flexibility, muscular endurance and strength shown through measurements of functional capaciy and personal statement of participant.   Diabetes Yes   Intervention Provide education about signs/symptoms and action to take for hypo/hyperglycemia.;Provide education about proper nutrition, including hydration, and  aerobic/resistive exercise prescription along with prescribed medications to achieve blood glucose in normal ranges:  Fasting glucose 65-99 mg/dL   Expected Outcomes Short Term: Participant verbalizes understanding of the signs/symptoms and immediate care of hyper/hypoglycemia, proper foot care and importance of medication, aerobic/resistive exercise and nutrition plan for blood glucose control.;Long Term: Attainment of HbA1C < 7%.   Hypertension Yes   Intervention Provide education on lifestyle modifcations including regular physical activity/exercise, weight management, moderate sodium restriction and increased consumption of fresh fruit, vegetables, and low fat dairy, alcohol moderation, and smoking cessation.;Monitor prescription use compliance.   Expected Outcomes Short Term: Continued assessment and intervention until BP is < 140/76m HG in hypertensive participants. < 130/86mHG in hypertensive participants with diabetes, heart failure or chronic kidney disease.;Long Term: Maintenance of blood pressure at goal levels.   Lipids Yes   Intervention Provide education and support for participant on nutrition & aerobic/resistive exercise along with prescribed medications to achieve LDL <7072mHDL >74m52m Expected Outcomes Short Term: Participant states understanding of desired cholesterol values and is compliant with medications prescribed. Participant is following exercise prescription and nutrition guidelines.;Long Term: Cholesterol controlled with medications as prescribed, with individualized exercise RX and with personalized nutrition plan. Value goals: LDL < 70mg55mL > 40 mg.       Personal Goals Discharge:     Goals and Risk Factor Review    Row Name 11/30/15 1202 01/02/16 0921 02/27/16 1733 03/05/16 0732       Core Components/Risk Factors/Patient Goals Review   Personal Goals Review Weight Management/Obesity;Sedentary;Diabetes;Hypertension Sedentary Weight Management/Obesity;Sedentary  Weight Management/Obesity;Sedentary;Hypertension    Review He has lost some weight (down to 97.5 kg). He is working out in yard, but has yet to starting a walking program (plans to start soon).  His blood sugars and blood pressures still tend to fluctuate and will continue to be monitored Pt has noticed an improvement in his stamina. Home exercise is inconsistent. Pt's weight is close to short-term goal. Current wt is 96.8 kg. [facility] Pt's weight is down 3.5 lbs from admission. Patient plans to keep working towards his goal by exercising 3 days/week at a local fitness center.    Expected Outcomes Conintued weight loss and stabilize blood surgas and blood pressures. Consistently walk 1 to 2 days at home. Maintain regular aerobic exercise at least 30 minutes 3-4 days per week at fitness center or by walking at home. Maintain regular aerobic exercise at least 30 minutes 3-4 days per week at fitness center or by walking at home.       Nutrition & Weight - Outcomes:     Pre Biometrics - 10/27/15 1026      Pre Biometrics   Height _0  (1.803 m)   Weight 218 lb 7.6 oz (99.1 kg)   Waist Circumference 41 inches   Hip Circumference 39 inches   Waist to Hip Ratio 1.05 %   BMI (Calculated) 30.5   Triceps Skinfold 12 mm   % Body Fat 27.6 %   Grip Strength 46 kg   Flexibility 11.5 in   Single Leg Stand 30 seconds         Post Biometrics - 03/02/16 1345       Post  Biometrics   Height _1  (1.803 m)   Weight 214 lb 15.2 oz (97.5 kg)   Waist Circumference 41.5 inches   Hip Circumference 40.5 inches   Waist to Hip Ratio 1.02 %   BMI (Calculated) 30   Triceps Skinfold  16 mm   % Body Fat 28.8 %   Grip Strength 58.5 kg   Flexibility 12 in   Single Leg Stand 21.06 seconds      Nutrition:     Nutrition Therapy & Goals - 10/27/15 0946      Nutrition Therapy   Diet Diabetic, Therapeutic Lifestyle Changes    Drug/Food Interactions Coumadin/Vit K     Personal Nutrition Goals   Personal Goal #1 6-12 lb wt loss to a goal wt of 205 lb at graduation from [facility], educate and counsel regarding individualized specific dietary modifications aiming towards targeted core components such as weight, hypertension, lipid management, diabetes, heart failure and other comorbidities.   Expected Outcomes Short Term Goal: Understand basic principles of dietary content, such as calories, fat, sodium, cholesterol and nutrients.;Long Term Goal: Adherence to prescribed nutrition plan.      Nutrition Discharge:     Nutrition Assessments - 03/16/16 1120      MEDFICTS Scores   Pre Score 45   Post Score 35   Score Difference -10      Education Questionnaire Score:     Knowledge Questionnaire Score - 03/02/16 1344      Knowledge Questionnaire Score   Pre Score 20/24               DM score 14/15   Post Score 21/24      Goals reviewed with patient Pt graduated from cardiac rehab program today with completion of 26 exercise sessions in Phase II during a 12 week period per [facility] reimbursement. Pt maintained good attendance but did have a brief absence due to having gall bladder surgery. Pt attended education classes and demonstrated and improved knowledge of heart healthy lifestyle. Pt progressed nicely during his participation in rehab as evidenced by increased MET level.   Medication list reconciled. Pt states improved medication compliance.  Pt reports an improved understanding of medications purpose which has helped him with improved compliance Repeat  PHQ score- 1 . Pt has supportive family but longs for companion ship.  Pt lost his wife a few years ago and misses her greatly.   Pt has made significant lifestyle changes and should be commended for his success. Pt feels he has achieved his goals during cardiac rehab.  Pt improved cardio capacity and pt  desired goal weight of 200 -205. Pt at the completion of the program weight 211. Pt feels he will be successful in achieving this goal.  Pt plans to continue exercise with walking every day. Pt was a delight to have in the rehab program. Maurice Small RN, BSN   .

## 2016-03-01 ENCOUNTER — Encounter (HOSPITAL_COMMUNITY): Payer: Self-pay | Admitting: *Deleted

## 2016-03-01 ENCOUNTER — Telehealth: Payer: Self-pay | Admitting: Cardiology

## 2016-03-01 DIAGNOSIS — Z955 Presence of coronary angioplasty implant and graft: Secondary | ICD-10-CM

## 2016-03-01 NOTE — Telephone Encounter (Signed)
New message      Pt c/o medication issue:  1. Name of Medication: generic plavix 75mg  and nitro 0.4  2. How are you currently taking this medication (dosage and times per day)?  3. Are you having a reaction (difficulty breathing--STAT)? no 4. What is your medication issue? Pt states these medications are at the pharmacy waiting on him to pick up but they are too expensive.  Is there something else he can take that is cheaper?

## 2016-03-01 NOTE — Telephone Encounter (Signed)
Spoke with pt and he states that Nitro is going to be $41 at [facility]. Advised pt he could call other local pharmacies and see if it would be cheaper at one of those. Pt was not told price of Plavix when he spoke with the pharmacy.   Advised it is normally not expensive and is usually the cheaper medication in this class. Pt will pick up Plavix this evening and will check with other pharmacies in regards to price of Nitro. Advised pt if he has any problems picking up meds to please let us know. Pt verbalized understanding and was in agreement with this plan.

## 2016-03-01 NOTE — Progress Notes (Signed)
Cardiac Individual Treatment Plan  Patient Details  Name: [name] [name] MRN: [id number] Date of Birth: August 22, 1955 Referring Provider:        CARDIAC REHAB PHASE II EXERCISE from 11/23/2015 in [facility]   Referring Provider  Loralie Champagne MD      Initial Encounter Date:       CARDIAC REHAB PHASE II EXERCISE from 11/23/2015 in [facility]   Date  10/27/15   Referring Provider  Loralie Champagne MD      Visit Diagnosis: Stented coronary artery  Patient's Home Medications on Admission:  Current outpatient prescriptions:  .  acetaminophen (TYLENOL) 500 MG tablet, Take 1,000 mg by mouth every 6 (six) hours as needed (pain). Reported on 08/18/2015, Disp: , Rfl:  .  amLODipine (NORVASC) 5 MG tablet, Take 1 tablet (5 mg total) by mouth daily., Disp: 90 tablet, Rfl: 3 .  atorvastatin (LIPITOR) 80 MG tablet, Take 1 tablet (80 mg total) by mouth daily., Disp: 30 tablet, Rfl: 1 .  carvedilol (COREG) 12.5 MG tablet, Take 1 tablet (12.5 mg total) by mouth 2 (two) times daily with a meal., Disp: 180 tablet, Rfl: 3 .  clopidogrel (PLAVIX) 75 MG tablet, Take 1 tablet (75 mg total) by mouth daily with breakfast., Disp: 90 tablet, Rfl: 3 .  furosemide (LASIX) 40 MG tablet, TAKE ONE TABLET BY MOUTH IN THE MORNING AND ONE-HALF TABLET IN THE EVENING (Patient taking differently: Take 40 mg by mouth 2 (two) times daily as needed for fluid. ), Disp: 90 tablet, Rfl: 3 .  hydrALAZINE (APRESOLINE) 50 MG tablet, Take 1 tablet (50 mg total) by mouth 3 (three) times daily. (Patient taking differently: Take 50 mg by mouth 3 (three) times daily. Pt reports taking medication twice a day verses three times a day), Disp: 270 tablet, Rfl: 3 .  Insulin Glargine (LANTUS SOLOSTAR) 100 UNIT/ML Solostar Pen, Inject 50 Units into the skin every morning. And pen needles 1/day, Disp: 30 mL, Rfl: 3 .  metFORMIN (GLUCOPHAGE) 1000 MG tablet, Take 1 tablet (1,000 mg total)  by mouth 2 (two) times daily with a meal., Disp: 180 tablet, Rfl: 3 .  nitroGLYCERIN (NITROSTAT) 0.4 MG SL tablet, Place 1 tablet (0.4 mg total) under the tongue every 5 (five) minutes as needed for chest pain (x 3 tabs daily). Reported on 08/18/2015, Disp: 100 tablet, Rfl: 1 .  oxyCODONE (OXY IR/ROXICODONE) 5 MG immediate release tablet, Take 1-2 tablets (5-10 mg total) by mouth every 4 (four) hours as needed for moderate pain., Disp: 30 tablet, Rfl: 0 .  potassium chloride SA (K-DUR,KLOR-CON) 20 MEQ tablet, Take 1 tablet (20 mEq total) by mouth daily. (Patient taking differently: Take 10 mEq by mouth every other day. ), Disp: 90 tablet, Rfl: 3 .  spironolactone (ALDACTONE) 25 MG tablet, Take 1 tablet (25 mg total) by mouth daily., Disp: 90 tablet, Rfl: 3 .  warfarin (COUMADIN) 5 MG tablet, Take as directed by coumadin clinic (Patient taking differently: Take 2.5-5 mg by mouth daily. Take 2.5mg  by mouth on Monday and Friday. Take 5 mg by mouth on all other days), Disp: 90 tablet, Rfl: 1  Past Medical History: Past Medical History  Diagnosis Date  . Hypertension   . Gout   . PAF (paroxysmal atrial fibrillation) (HCC)     On coumadin  . LIVER FUNCTION TESTS, ABNORMAL, HX OF 12/30/2006  . Rectal bleeding 12/18/2011    Scheduled for colonoscopy.    [name]  HEPATITIS B, CHRONIC 12/30/2006  . HYPERCHOLESTEROLEMIA 07/11/2010  . MITRAL REGURGITATION 12/30/2006  . COLONIC POLYPS, HX OF 12/30/2006  . CAD (coronary artery disease), native coronary artery     a. Nonobstructive CAD by cath 2013 - diffuse distal and branch vessel CAD, no severe disease in the major coronaries, LV mild global hypokinesis, EF 45%. b. ETT-Sestamibi 5/14: EF 31%, small fixed inferior defect with no ischemia.  . Chronic CHF ([place])     a. Mixed ICM/NICM (?EtOH). EF 35% in 2008. Echo 5/13: EF 60-65%, mod LVH, EF 45% on V gram in 12/2011. EF 12/2012: EF 50-55%, mild LVH, inferobasal HK, mild MR. ETT-Ses 5/14 EF 41%. Cardiac MRI 5/14: EF 44%, mild  global HK, subepicardial delayed enhancement in nonspecific RV insertion pattern.  . H/O atrial flutter 2007    a. Ablations in 2007, 2008.  [name] History of medication noncompliance   . History of alcohol abuse   . 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 Heart murmur   . Anginal pain ([place])   . Sleep apnea     "suppose to send mask but they never did" (05/03/2015)  . Type II diabetes mellitus ([place])   . Left sciatic nerve pain since 04/2015  . Anxiety   . Cholecystitis   . Arrhythmia   . History of nuclear stress test     Myoview 3/17: + chest pain; EF 33%, downsloping ST depression 2, V4-6, LVH with strain, no ischemia on images; intermediate risk   . Shortness of breath dyspnea     with excertion  . History of hiatal hernia     Tobacco Use: History  Smoking status  . Former Smoker -- 4.00 packs/day for 18 years  . Types: Cigarettes  . Quit date: 08/14/1983  Smokeless tobacco  . Never Used    Labs: Recent Review Flowsheet Data    Labs for ITP Cardiac and Pulmonary Rehab Latest Ref Rng 05/09/2015 07/12/2015 08/04/2015 11/10/2015 01/26/2016   Cholestrol 125 - 200 mg/dL - 104(L) - - -   LDLCALC <130 mg/dL - 44 - - -   HDL >=40 mg/dL - 44 - - -   Trlycerides <150 mg/dL - 78 - - -   Hemoglobin A1c - 8.5 - 6.8 8.0 7.6      Capillary Blood Glucose: Lab Results  Component Value Date   GLUCAP 191* 02/29/2016   GLUCAP 228* 02/27/2016   GLUCAP 140* 02/08/2016   GLUCAP 163* 02/07/2016   GLUCAP 112* 02/07/2016     Exercise Target Goals:    Exercise Program Goal: Individual exercise prescription set with THRR, safety & activity barriers. Participant demonstrates ability to understand and report RPE using BORG scale, to self-measure pulse accurately, and to acknowledge the importance of the exercise prescription.  Exercise Prescription Goal: Starting with aerobic activity 30 plus minutes a day, 3 days per week for initial exercise prescription. Provide home exercise  prescription and guidelines that participant acknowledges understanding prior to discharge.  Activity Barriers & Risk Stratification:   6 Minute Walk:     6 Minute Walk      02/27/16 1247       6 Minute Walk   Phase Discharge     Distance 2000 feet     Distance % Change 8.81 %     Walk Time 6 minutes     # of Rest Breaks 0     MPH 3.79     METS 5.05     RPE  8     VO2 Peak 17.69     Symptoms No     Resting HR 84 bpm     Resting BP 132/60 mmHg     Max Ex. HR 120 bpm     Max Ex. BP 168/80 mmHg     Pre/Post BP   Baseline BP 132/60 mmHg     6 Minute BP 168/80 mmHg        Initial Exercise Prescription:   Perform Capillary Blood Glucose checks as needed.  Exercise Prescription Changes:     Exercise Prescription Changes      02/27/16 1700           Exercise Review   Progression Yes       Response to Exercise   Blood Pressure (Admit) 132/60 mmHg       Blood Pressure (Exercise) 164/84 mmHg       Blood Pressure (Exit) 120/60 mmHg       Heart Rate (Admit) 84 bpm       Heart Rate (Exercise) 120 bpm       Heart Rate (Exit) 83 bpm       Rating of Perceived Exertion (Exercise) 13       Comments Home Exercise Given 11/25/15       Duration Progress to 30 minutes of continuous aerobic without signs/symptoms of physical distress       Intensity THRR unchanged       Progression   Progression Continue to progress workloads to maintain intensity without signs/symptoms of physical distress.       Average METs 3.8       Resistance Training   Training Prescription Yes       Weight 5lbs.       Reps 10-12       Interval Training   Interval Training No       Bike   Level 1.5       Minutes 10       METs 3.92       NuStep   Level 4       Minutes 10       METs 3.7       Track   Laps 17       Minutes 10       METs 3.95       Home Exercise Plan   Plans to continue exercise at [facility] (comment)       Frequency Add 2 additional days to program exercise  sessions.          Exercise Comments:     Exercise Comments      02/27/16 1249           Exercise Comments Pt scheduled to graduate on 03/02/16. Plans to continue exercise 30 minutes, 3 days per week at the [facility].          Discharge Exercise Prescription (Final Exercise Prescription Changes):     Exercise Prescription Changes - 02/27/16 1700    Exercise Review   Progression Yes   Response to Exercise   Blood Pressure (Admit) 132/60 mmHg   Blood Pressure (Exercise) 164/84 mmHg   Blood Pressure (Exit) 120/60 mmHg   Heart Rate (Admit) 84 bpm   Heart Rate (Exercise) 120 bpm   Heart Rate (Exit) 83 bpm   Rating of Perceived Exertion (Exercise) 13   Comments Home Exercise Given 11/25/15   Duration Progress to 30 minutes of continuous aerobic without signs/symptoms of  physical distress   Intensity THRR unchanged   Progression   Progression Continue to progress workloads to maintain intensity without signs/symptoms of physical distress.   Average METs 3.8   Resistance Training   Training Prescription Yes   Weight 5lbs.   Reps 10-12   Interval Training   Interval Training No   Bike   Level 1.5   Minutes 10   METs 3.92   NuStep   Level 4   Minutes 10   METs 3.7   Track   Laps 17   Minutes 10   METs 3.95   Home Exercise Plan   Plans to continue exercise at [facility] (comment)   Frequency Add 2 additional days to program exercise sessions.      Nutrition:  Target Goals: Understanding of nutrition guidelines, daily intake of sodium 1500mg , cholesterol 200mg , calories 30% from fat and 7% or less from saturated fats, daily to have 5 or more servings of fruits and vegetables.  Biometrics:    Nutrition Therapy Plan and Nutrition Goals:   Nutrition Discharge: Nutrition Scores:   Nutrition Goals Re-Evaluation:   Psychosocial: Target Goals: Acknowledge presence or absence of depression, maximize coping skills, provide positive support  system. Participant is able to verbalize types and ability to use techniques and skills needed for reducing stress and depression.  Initial Review & Psychosocial Screening:   Quality of Life Scores:     Quality of Life - 02/29/16 1251    Quality of Life Scores   Health/Function Pre 19.77 %   Health/Function Post 16.93 %   Health/Function % Change -14.37 %   Socioeconomic Pre 20.42 %   Socioeconomic Post 16 %   Socioeconomic % Change  -21.65 %   Psych/Spiritual Pre 18.93 %   Psych/Spiritual Post 18.86 %   Psych/Spiritual % Change -0.37 %   Family Pre 22.8 %   Family Post 25 %   Family % Change 9.65 %   GLOBAL Pre 20.17 %   GLOBAL Post 18.32 %   GLOBAL % Change -9.17 %      PHQ-9:     Recent Review Flowsheet Data    Depression screen [facility] 2/9 02/29/2016 02/29/2016 01/26/2016 12/29/2015 11/11/2015   Decreased Interest 0 0 0 1 0   Down, Depressed, Hopeless 1 0 0 0 0   PHQ - 2 Score 1 0 0 1 0   Altered sleeping - - - 1 -   Tired, decreased energy - - - 1 -   Change in appetite - - - 1 -   Feeling bad or failure about yourself  - - - 1 -   Trouble concentrating - - - 0 -   Moving slowly or fidgety/restless - - - 0 -   Suicidal thoughts - - - 0 -   PHQ-9 Score - - - 5 -      Psychosocial Evaluation and Intervention:   Psychosocial Re-Evaluation:   Vocational Rehabilitation: Provide vocational rehab assistance to qualifying candidates.   Vocational Rehab Evaluation & Intervention:   Education: Education Goals: Education classes will be provided on a weekly basis, covering required topics. Participant will state understanding/return demonstration of topics presented.  Learning Barriers/Preferences:   Education Topics: Count Your Pulse:  -Group instruction provided by verbal instruction, demonstration, patient participation and written materials to support subject.  Instructors address importance of being able to find your pulse and how to count your pulse when at  home without a heart monitor.  Patients  get hands on experience counting their pulse with staff help and individually.      CARDIAC REHAB PHASE II EXERCISE from 02/29/2016 in [place]   Date  12/16/15   Educator  Andi Hence   Instruction Review Code  R- Review/reinforce [Second Class for practice]      Heart Attack, Angina, and Risk Factor Modification:  -Group instruction provided by verbal instruction, video, and written materials to support subject.  Instructors address signs and symptoms of angina and heart attacks.    Also discuss risk factors for heart disease and how to make changes to improve heart health risk factors.      CARDIAC REHAB PHASE II EXERCISE from 02/29/2016 in [place]   Date  12/14/15   Instruction Review Code  2- meets goals/outcomes      Functional Fitness:  -Group instruction provided by verbal instruction, demonstration, patient participation, and written materials to support subject.  Instructors address safety measures for doing things around the house.  Discuss how to get up and down off the floor, how to pick things up properly, how to safely get out of a chair without assistance, and balance training.      CARDIAC REHAB PHASE II EXERCISE from 02/29/2016 in [place]   Date  12/02/15   Instruction Review Code  2- meets goals/outcomes      Meditation and Mindfulness:  -Group instruction provided by verbal instruction, patient participation, and written materials to support subject.  Instructor addresses importance of mindfulness and meditation practice to help reduce stress and improve awareness.  Instructor also leads participants through a meditation exercise.       CARDIAC REHAB PHASE II EXERCISE from 02/29/2016 in [place]   Date  01/04/16   Educator  Jeanella Craze   Instruction Review Code  2- meets goals/outcomes       Stretching for Flexibility and Mobility:  -Group instruction provided by verbal instruction, patient participation, and written materials to support subject.  Instructors lead participants through series of stretches that are designed to increase flexibility thus improving mobility.  These stretches are additional exercise for major muscle groups that are typically performed during regular warm up and cool down.      CARDIAC REHAB PHASE II EXERCISE from 02/29/2016 in [place]   Date  01/07/16   Educator  Luetta Nutting Iline Oven   Instruction Review Code  2- meets goals/outcomes      Hands Only CPR Anytime:  -Group instruction provided by verbal instruction, video, patient participation and written materials to support subject.  Instructors co-teach with AHA video for hands only CPR.  Participants get hands on experience with mannequins.   Nutrition I class: Heart Healthy Eating:  -Group instruction provided by PowerPoint slides, verbal discussion, and written materials to support subject matter. The instructor gives an explanation and review of the Therapeutic Lifestyle Changes diet recommendations, which includes a discussion on lipid goals, dietary fat, sodium, fiber, plant stanol/sterol esters, sugar, and the components of a well-balanced, healthy diet.      CARDIAC REHAB PHASE II EXERCISE from 02/29/2016 in [place]   Date  11/18/15   Educator  RD   Instruction Review Code  Not applicable      Nutrition II class: Lifestyle Skills:  -Group instruction provided by PowerPoint slides, verbal discussion, and written materials to support subject  matter. The instructor gives an explanation and review of label reading, grocery shopping for heart health, heart healthy recipe modifications, and ways to make healthier choices when eating out.      CARDIAC REHAB PHASE II EXERCISE from 02/29/2016 in [place]   Date  11/18/15   Educator  RD   Instruction Review Code  Not applicable      Diabetes Question & Answer:  -Group instruction provided by PowerPoint slides, verbal discussion, and written materials to support subject matter. The instructor gives an explanation and review of diabetes co-morbidities, pre- and post-prandial blood glucose goals, pre-exercise blood glucose goals, signs, symptoms, and treatment of hypoglycemia and hyperglycemia, and foot care basics.      CARDIAC REHAB PHASE II EXERCISE from 02/29/2016 in [place]   Date  12/09/15   Educator  RD   Instruction Review Code  2- meets goals/outcomes      Diabetes Blitz:  -Group instruction provided by PowerPoint slides, verbal discussion, and written materials to support subject matter. The instructor gives an explanation and review of the physiology behind type 1 and type 2 diabetes, diabetes medications and rational behind using different medications, pre- and post-prandial blood glucose recommendations and Hemoglobin A1c goals, diabetes diet, and exercise including blood glucose guidelines for exercising safely.       CARDIAC REHAB PHASE II EXERCISE from 02/29/2016 in [place]   Date  11/18/15   Educator  RD   Instruction Review Code  Not applicable      Portion Distortion:  -Group instruction provided by PowerPoint slides, verbal discussion, written materials, and food models to support subject matter. The instructor gives an explanation of serving size versus portion size, changes in portions sizes over the last 20 years, and what consists of a serving from each food group.   Stress Management:  -Group instruction provided by verbal instruction, video, and written materials to support subject matter.  Instructors review role of stress in heart disease and how to cope with stress positively.        CARDIAC REHAB PHASE II EXERCISE from 02/29/2016  in [place]   Date  11/30/15   Instruction Review Code  2- meets goals/outcomes      Exercising on Your Own:  -Group instruction provided by verbal instruction, power point, and written materials to support subject.  Instructors discuss benefits of exercise, components of exercise, frequency and intensity of exercise, and end points for exercise.  Also discuss use of nitroglycerin and activating EMS.  Review options of places to exercise outside of rehab.  Review guidelines for sex with heart disease.      CARDIAC REHAB PHASE II EXERCISE from 02/29/2016 in [place]   Date  01/11/16   Educator  Seward Carol, MS,ACSM CCES      Cardiac Drugs I:  -Group instruction provided by verbal instruction and written materials to support subject.  Instructor reviews cardiac drug classes: antiplatelets, anticoagulants, beta blockers, and statins.  Instructor discusses reasons, side effects, and lifestyle considerations for each drug class.      CARDIAC REHAB PHASE II EXERCISE from 02/29/2016 in [place]   Date  11/23/15   Educator  Pharm D   Instruction Review Code  2- meets goals/outcomes      Cardiac Drugs II:  -Group instruction provided by verbal instruction and written materials  to support subject.  Instructor reviews cardiac drug classes: angiotensin converting enzyme inhibitors (ACE-I), angiotensin II receptor blockers (ARBs), nitrates, and calcium channel blockers.  Instructor discusses reasons, side effects, and lifestyle considerations for each drug class.      CARDIAC REHAB PHASE II EXERCISE from 02/29/2016 in [place]   Date  12/21/15   Educator  Pharm D   Instruction Review Code  2- meets goals/outcomes      Anatomy and Physiology of the Circulatory System:  -Group instruction provided by verbal instruction, video, and written materials to support  subject.  Reviews functional anatomy of heart, how it relates to various diagnoses, and what role the heart plays in the overall system.      CARDIAC REHAB PHASE II EXERCISE from 02/29/2016 in [place]   Date  02/29/16   Instruction Review Code  2- meets goals/outcomes      Knowledge Questionnaire Score:   Core Components/Risk Factors/Patient Goals at Admission:   Core Components/Risk Factors/Patient Goals Review:      Goals and Risk Factor Review      02/27/16 1733           Core Components/Risk Factors/Patient Goals Review   Personal Goals Review Weight Management/Obesity;Sedentary       Review Pt's weight is close to short-term goal. Current wt is 96.8 kg. Pt plans to continue exercise at the [facility], 30 minutes, 3 days/ week.       Expected Outcomes Maintain regular aerobic exercise at least 30 minutes 3-4 days per week at fitness center or by walking at home.          Core Components/Risk Factors/Patient Goals at Discharge (Final Review):      Goals and Risk Factor Review - 02/27/16 1733    Core Components/Risk Factors/Patient Goals Review   Personal Goals Review Weight Management/Obesity;Sedentary   Review Pt's weight is close to short-term goal. Current wt is 96.8 kg. Pt plans to continue exercise at the [facility], 30 minutes, 3 days/ week.   Expected Outcomes Maintain regular aerobic exercise at least 30 minutes 3-4 days per week at fitness center or by walking at home.      ITP Comments:   Comments: Pt is making expected progress toward personal goals after completing 25 sessions. Pt with brief absence due to lap cholecystectomy and returned to cardiac rehab on 7/17. Pt with no psychosocial needs.  No further intervention warranted at this time. Recommend continued exercise and life style modification education including  stress management and relaxation techniques to decrease cardiac risk profile. Cherre Huger, BSN

## 2016-03-02 ENCOUNTER — Encounter (HOSPITAL_COMMUNITY)
Admission: RE | Admit: 2016-03-02 | Discharge: 2016-03-02 | Disposition: A | Discharge status: Home / Self Care | Payer: Medicare Other | Source: Ambulatory Visit | Attending: Cardiology | Admitting: Cardiology

## 2016-03-02 VITALS — BP 138/74 | HR 91 | Ht 71.0 in | Wt 214.9 lb

## 2016-03-02 DIAGNOSIS — Z955 Presence of coronary angioplasty implant and graft: Secondary | ICD-10-CM

## 2016-03-02 DIAGNOSIS — I509 Heart failure, unspecified: Secondary | ICD-10-CM | POA: Diagnosis not present

## 2016-03-02 LAB — GLUCOSE, CAPILLARY: GLUCOSE-CAPILLARY: 210 mg/dL — AB (ref 65–99)

## 2016-03-08 ENCOUNTER — Ambulatory Visit (INDEPENDENT_AMBULATORY_CARE_PROVIDER_SITE_OTHER): Payer: Medicare Other | Admitting: *Deleted

## 2016-03-08 DIAGNOSIS — Z7901 Long term (current) use of anticoagulants: Secondary | ICD-10-CM | POA: Diagnosis not present

## 2016-03-08 DIAGNOSIS — Z5181 Encounter for therapeutic drug level monitoring: Secondary | ICD-10-CM

## 2016-03-08 DIAGNOSIS — I4891 Unspecified atrial fibrillation: Secondary | ICD-10-CM | POA: Diagnosis not present

## 2016-03-08 LAB — POCT INR: INR: 4.6

## 2016-03-22 ENCOUNTER — Ambulatory Visit (INDEPENDENT_AMBULATORY_CARE_PROVIDER_SITE_OTHER): Payer: Medicare Other | Admitting: *Deleted

## 2016-03-22 DIAGNOSIS — Z5181 Encounter for therapeutic drug level monitoring: Secondary | ICD-10-CM

## 2016-03-22 DIAGNOSIS — I4891 Unspecified atrial fibrillation: Secondary | ICD-10-CM | POA: Diagnosis not present

## 2016-03-22 DIAGNOSIS — Z7901 Long term (current) use of anticoagulants: Secondary | ICD-10-CM | POA: Diagnosis not present

## 2016-03-22 LAB — POCT INR: INR: 4.9

## 2016-04-02 ENCOUNTER — Ambulatory Visit (INDEPENDENT_AMBULATORY_CARE_PROVIDER_SITE_OTHER): Payer: Medicare Other

## 2016-04-02 DIAGNOSIS — Z5181 Encounter for therapeutic drug level monitoring: Secondary | ICD-10-CM

## 2016-04-02 DIAGNOSIS — Z7901 Long term (current) use of anticoagulants: Secondary | ICD-10-CM | POA: Diagnosis not present

## 2016-04-02 DIAGNOSIS — I4891 Unspecified atrial fibrillation: Secondary | ICD-10-CM

## 2016-04-02 LAB — POCT INR: INR: 2

## 2016-04-05 ENCOUNTER — Telehealth: Payer: Self-pay | Admitting: Cardiology

## 2016-04-05 MED ORDER — ATORVASTATIN CALCIUM 40 MG PO TABS: ORAL_TABLET | ORAL | 3 refills | Status: DC

## 2016-04-05 MED FILL — CLOPIDOGREL 75 MG TABLET: 75 | 30 days supply | Qty: 30 | Fill #0

## 2016-04-05 MED FILL — ?ATORVASTATIN 40MG TABLET: 40 | 30 days supply | Qty: 60 | Fill #0

## 2016-04-05 NOTE — Telephone Encounter (Signed)
New Message  Pt states he spoke with RN on 8/23 about receiving his prescription from the pharmacy. Pt states he had a few more questions for RN. Please call back to discuss

## 2016-04-05 NOTE — Telephone Encounter (Signed)
Pt states he no longer has Medicare Part D and [facility] will not be able to refill his medication. Pt states he has been told by [facility] that he will have to apply for Medicaid to get assistance with his medication. Pt states he has been told by Medicaid he will have to go to [employer] and apply for Medicaid. Pt states he is out of clopidogrel and atorvastatin.  I spoke with Elmyra Ricks at [facility] and [place], Elmyra Ricks was able to transfer clopidogrel 75mg  daily from [place] at a cost of $4 for pt. Elmyra Ricks states atorvastatain 80 mg daily would be a cost of $10 but 2 of an atorvastatin 40mg  would be provided at no cost to patient. A prescription for atorvastatin 2 of a 40mg  tablet has been sent to [place].  Pt advised he can get atorvastatin and clopidogrel at [place].

## 2016-04-05 NOTE — Telephone Encounter (Signed)
LMTCB

## 2016-04-10 ENCOUNTER — Other Ambulatory Visit: Payer: Self-pay | Admitting: Pharmacist

## 2016-04-10 MED ORDER — FUROSEMIDE 20 MG PO TABS: ORAL_TABLET | ORAL | 2 refills | Status: DC

## 2016-04-30 ENCOUNTER — Ambulatory Visit: Payer: Medicare Other | Admitting: Cardiology

## 2016-05-07 NOTE — Progress Notes (Signed)
Cardiology Office Note:    Date:  05/08/2016   ID:  [name] [name], DOB 02-Aug-1956, MRN [id number]  PCP:  Angelica Chessman, MD  Cardiologist:  Dr. Loralie Champagne   Electrophysiologist:  N/a Gen Surgeon: Dr. Redmond Pulling  Referring MD: Tresa Garter, MD   Chief Complaint  Patient presents with  . Follow-up    CAD, PAF, CHF    History of Present Illness:    [name] [name] is a 60 y.o. male with a hx of HTN, DM, PAF, CAD, CKD. LHC in 5/13 demonstrated severe distal and branch vessel disease and he was treated with med Rx.  Echo in 5/13 demonstrated normal LVF. Cardiac MRI in 2014 demonstrated EF 44%. In 10/2013 he had an episode of AFib with RVR. Echo 2/16 demonstrated EF 55-60% and moderate diastolic dysfunction. Myoview in 9/16 demonstrated inferior ischemia. He underwent right and left-sided heart catheterization. Filling pressures were not significantly elevated. He did have severe distal RCA stenosis that was treated with DES 2. Repeat echo 9/16 demonstrated EF 55-60% and moderate diastolic dysfunction.   Admitted 10/16 with acute cholecystitis s/p cholecystotomy tube. Patient ideally needs to remain on Plavix plus Coumadin through 9/17 (12 months post PCI). However, it was felt that he could come off Plavix at 6 months (after 3/17) briefly for cholecystectomy.   Admitted 0000000 with a/c diastolic CHF in the setting of atrial flutter. He was diuresed and underwent DCCV with restoration of NSR.   He was preparing for cholecystectomy in 3/17 and, when seen for presurgical cardiac evaluation, he complained of chest pain.  Stress test was arranged and did not demonstrate ischemia but there were significant ECG changes and he was set up for cardiac cath. This demonstrated extensive, diffuse CAD c/w poorly controlled diabetes and patent RCA stents.  Med Rx was continued.    Last seen by Dr. Loralie Champagne in 6/17.  He subsequently underwent laparoscopic cholecystectomy with Dr.  Redmond Pulling on 02/07/16.    He returns for FU.  Here alone.  He is overall doing well.  He denies chest pain, syncope, orthopnea, edema.  He notes dyspnea with mod to extreme activities.  He ran out of Amlodipine 3 weeks ago.  He has not taken any medications yet today.   Prior CV studies that were reviewed today include:    Echo 01/13/16 Severe focal basal and mod conc LVH, EF 55-60%, no RWMA, Gr 2 DD, mild AI, mild MR, mild LAE  LHC 11/04/15 LM 20% distal  LAD 40% ostial, 40% proximal, Diffuse moderate distal vessel disease.  RI  50% ostial/proximal, 60-70% mid, Diffuse distal and branch vessel disease.  LCx - AV LCx was relatively small with diffuse mild-moderate disease.  RCA -  Patent ostial and distal RCA stents, 40% mid, Diffuse mild to moderate distal disease. Extensive CAD but no severe/critical lesions.  He has diffuse disease consistent with his poorly controlled diabetes. Stents in the RCA are patent. Continue medical management.   Myoview 11/01/15 + chest pain; EF 33%, downsloping ST depression 2, V4-6, LVH with strain, no ischemia on images; intermediate risk   R/L [facility] 05/03/15 PCI: 2.5 x 16 Synergy DES to the distal RCA, 3 x 12 Synergy DES to the proximal RCA  Myoview 9/16 Partially reversible (SDS 4) moderate-sized and intensity inferior perfusion defect, which suggests ischemia, however patient motion noted during the study and mis-registration artifact is possible. Dilated LV with severe hypokinesis - LVEF 26%. This is a high risk study.  Echo  9/16 Mild LVH, EF 55-60%, normal wall motion, grade 2 diastolic dysfunction, trivial AI, mild MR, moderate LAE, normal RVSF, mild TR  LHC (5/13):  LHC (5/13) with diffuse distal and branch vessel disease, mild global hypokinesis and EF 45%  Echo (5/14):  inferbasal HK, mild LVH, EF 50-55%, trivial AI, mild MR, mild LAE  Nuclear (5/14):  Inf defect c/w thinning vs infarct, no ischemia, EF 31%; High Risk  cMRI (5/14):  Mild  LVH, EF 44%, normal RV size and function, small areas of subepicardial delayed enhancement of the mid anteroseptal and mid inferoseptal RV insertion sites-nonspecific finding  Past Medical History:  Diagnosis Date  . Anginal pain ([place])   . Anxiety   . Arrhythmia   . CAD (coronary artery disease), native coronary artery    a. Nonobstructive CAD by cath 2013 - diffuse distal and branch vessel CAD, no severe disease in the major coronaries, LV mild global hypokinesis, EF 45%. b. ETT-Sestamibi 5/14: EF 31%, small fixed inferior defect with no ischemia.  . Cholecystitis   . Chronic CHF ([place])    a. Mixed ICM/NICM (?EtOH). EF 35% in 2008. Echo 5/13: EF 60-65%, mod LVH, EF 45% on V gram in 12/2011. EF 12/2012: EF 50-55%, mild LVH, inferobasal HK, mild MR. ETT-Ses 5/14 EF 41%. Cardiac MRI 5/14: EF 44%, mild global HK, subepicardial delayed enhancement in nonspecific RV insertion pattern.  . COLONIC POLYPS, HX OF 12/30/2006  . Gout   . H/O atrial flutter 2007   a. Ablations in 2007, 2008.  [name] Heart murmur   . HEPATITIS B, CHRONIC 12/30/2006  . History of alcohol abuse   . History of hiatal hernia   . History of medication noncompliance   . History of nuclear stress test    Myoview 3/17: + chest pain; EF 33%, downsloping ST depression 2, V4-6, LVH with strain, no ischemia on images; intermediate risk   . HYPERCHOLESTEROLEMIA 07/11/2010  . Hypertension   . Left sciatic nerve pain since 04/2015  . LIVER FUNCTION TESTS, ABNORMAL, HX OF 12/30/2006  . MITRAL REGURGITATION 12/30/2006  . 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 PAF (paroxysmal atrial fibrillation) (HCC)    On coumadin  . Rectal bleeding 12/18/2011   Scheduled for colonoscopy.    . Shortness of breath dyspnea    with excertion  . Sleep apnea    "suppose to send mask but they never did" (05/03/2015)  . Type II diabetes mellitus ([place])     Past Surgical History:  Procedure Laterality Date  . A FLUTTER ABLATION  2007, 2008    catheter ablation   . CARDIAC CATHETERIZATION N/A 05/03/2015   Procedure: Right/Left Heart Cath and Coronary Angiography;  Surgeon: Larey Dresser, MD;  Location: [facility];  Service: Cardiovascular;  Laterality: N/A;  . CARDIAC CATHETERIZATION N/A 05/03/2015   Procedure: Coronary Stent Intervention;  Surgeon: Jettie Booze, MD;  Location: [facility];  Service: Cardiovascular;  Laterality: N/A;  . CARDIAC CATHETERIZATION N/A 11/04/2015   Procedure: Left Heart Cath and Coronary Angiography;  Surgeon: Larey Dresser, MD;  Location: [facility];  Service: Cardiovascular;  Laterality: N/A;  . CARDIOVERSION N/A 08/24/2015   Procedure: CARDIOVERSION;  Surgeon: Thayer Headings, MD;  Location: [facility];  Service: Cardiovascular;  Laterality: N/A;  . CHOLECYSTECTOMY N/A 02/07/2016   Procedure: LAPAROSCOPIC CHOLECYSTECTOMY WITH  INTRAOPERATIVE CHOLANGIOGRAM;  Surgeon: Greer Pickerel, MD;  Location: [facility];  Service: General;  Laterality: N/A;  .  CORONARY ANGIOPLASTY  12/05/01  . LEFT HEART CATHETERIZATION WITH CORONARY ANGIOGRAM N/A 12/19/2011   Procedure: LEFT HEART CATHETERIZATION WITH CORONARY ANGIOGRAM;  Surgeon: Larey Dresser, MD;  Location: [facility];  Service: Cardiovascular;  Laterality: N/A;  . OSTEOCHONDROMA EXCISION Left 1972   "took bone tumor off my shoulder"    Current Medications: Outpatient Medications Prior to Visit  Medication Sig Dispense Refill  . acetaminophen (TYLENOL) 500 MG tablet Take 1,000 mg by mouth every 6 (six) hours as needed (pain). Reported on 08/18/2015    . atorvastatin (LIPITOR) 40 MG tablet Take 2 tablets (80mg ) by mouth daily 180 tablet 3  . carvedilol (COREG) 12.5 MG tablet Take 1 tablet (12.5 mg total) by mouth 2 (two) times daily with a meal. 180 tablet 3  . clopidogrel (PLAVIX) 75 MG tablet Take 1 tablet (75 mg total) by mouth daily with breakfast. 90 tablet 3  . furosemide (LASIX) 20 MG tablet Take 2 tablets in the morning and 1 in  the evening. 90 tablet 2  . Insulin Glargine (LANTUS SOLOSTAR) 100 UNIT/ML Solostar Pen Inject 50 Units into the skin every morning. And pen needles 1/day 30 mL 3  . metFORMIN (GLUCOPHAGE) 1000 MG tablet Take 1 tablet (1,000 mg total) by mouth 2 (two) times daily with a meal. 180 tablet 3  . nitroGLYCERIN (NITROSTAT) 0.4 MG SL tablet Place 1 tablet (0.4 mg total) under the tongue every 5 (five) minutes as needed for chest pain (x 3 tabs daily). Reported on 08/18/2015 100 tablet 1  . oxyCODONE (OXY IR/ROXICODONE) 5 MG immediate release tablet Take 1-2 tablets (5-10 mg total) by mouth every 4 (four) hours as needed for moderate pain. 30 tablet 0  . spironolactone (ALDACTONE) 25 MG tablet Take 1 tablet (25 mg total) by mouth daily. 90 tablet 3  . warfarin (COUMADIN) 5 MG tablet Take as directed by coumadin clinic (Patient taking differently: Take 2.5-5 mg by mouth daily. Take 2.5mg  by mouth on Monday and Friday. Take 5 mg by mouth on all other days) 90 tablet 1  . amLODipine (NORVASC) 5 MG tablet Take 1 tablet (5 mg total) by mouth daily. 90 tablet 3  . hydrALAZINE (APRESOLINE) 50 MG tablet Take 1 tablet (50 mg total) by mouth 3 (three) times daily. (Patient taking differently: Take 50 mg by mouth 3 (three) times daily. Pt reports taking medication twice a day verses three times a day) 270 tablet 3  . potassium chloride SA (K-DUR,KLOR-CON) 20 MEQ tablet Take 1 tablet (20 mEq total) by mouth daily. (Patient taking differently: Take 10 mEq by mouth every other day. ) 90 tablet 3   No facility-administered medications prior to visit.       Allergies:   Ace inhibitors and Other   Social History   Social History  . Marital status: Widowed    Spouse name: N/A  . Number of children: 2  . Years of education: N/A   Occupational History  .  Unemployed   Social History Main Topics  . Smoking status: Former Smoker    Packs/day: 4.00    Years: 18.00    Types: Cigarettes    Quit date: 08/14/1983  .  Smokeless tobacco: Never Used  . Alcohol use 9.6 oz/week    6 Cans of beer, 10 Shots of liquor per week     Comment: occasionally  . Drug use:     Types: Marijuana     Comment: occasionally  . Sexual activity: No  Other Topics Concern  . None   Social History Narrative  . None     Family History:  The patient's family history includes Diabetes in his mother; Hypertension in his mother.   ROS:   Please see the history of present illness.    Review of Systems  Constitution: Positive for malaise/fatigue.  Eyes: Positive for visual disturbance.  Cardiovascular: Positive for dyspnea on exertion.  Respiratory: Positive for snoring.   Neurological: Positive for dizziness.   All other systems reviewed and are negative.   EKGs/Labs/Other Test Reviewed:    EKG:  EKG is  ordered today.  The ekg ordered today demonstrates NSR, HR 63, normal axis, LVH with repol abnormality  Recent Labs: 06/04/2015: Magnesium 1.6 10/12/2015: Brain Natriuretic Peptide 45.5 02/03/2016: ALT 14 02/08/2016: BUN 15; Creatinine, Ser 1.13; Hemoglobin 13.9; Platelets 188; Potassium 4.1; Sodium 137   Recent Lipid Panel    Component Value Date/Time   CHOL 104 (L) 07/12/2015 0920   TRIG 78 07/12/2015 0920   HDL 44 07/12/2015 0920   CHOLHDL 2.4 07/12/2015 0920   VLDL 16 07/12/2015 0920   LDLCALC 44 07/12/2015 0920   LDLDIRECT 111.0 04/22/2015 1559     Physical Exam:    VS:  BP (!) 180/90   Pulse 64   Ht 5\' 11"  (1.803 m)   Wt 213 lb (96.6 kg)   BMI 29.71 kg/m     Wt Readings from Last 3 Encounters:  05/08/16 213 lb (96.6 kg)  03/02/16 214 lb 15.2 oz (97.5 kg)  02/07/16 215 lb (97.5 kg)     Physical Exam  Constitutional: He is oriented to person, place, and time. He appears well-developed and well-nourished. No distress.  HENT:  Head: Normocephalic and atraumatic.  Eyes: No scleral icterus.  Neck: Normal range of motion. No JVD present. Carotid bruit is not present.  Cardiovascular: Normal  rate, regular rhythm, S1 normal and S2 normal.   No murmur heard. Pulmonary/Chest: Effort normal and breath sounds normal. He has no wheezes. He has no rhonchi. He has no rales.  Abdominal: Soft. There is no tenderness.  Musculoskeletal: He exhibits no edema.  Neurological: He is alert and oriented to person, place, and time.  Skin: Skin is warm and dry.  Psychiatric: He has a normal mood and affect.    ASSESSMENT:    1. Paroxysmal atrial fibrillation (HCC)   2. Coronary artery disease involving native coronary artery of native heart without angina pectoris   3. Essential hypertension   4. Cardiomyopathy ([place])   5. Chronic diastolic CHF (congestive heart failure) ([place])   6. CKD (chronic kidney disease) stage 2, GFR 60-89 ml/min   7. Hyperlipidemia   8. OSA (obstructive sleep apnea)    PLAN:    In order of problems listed above:  1. PAF/Flutter - Maintaining NSR.  He has a hx of acute CHF in the setting of AFlutter.  He underwent DCCV in 1/17. Continue Coumadin.  CHADS2-VASc=4.      2. CAD - s/p DES x 2 to the RCA in 9/16. He has remained on Plavix and Coumadin 12 months post PCI. he denies angina.              -  Continue statin, beta blocker, Amlodipine.  -  Finish Plavix this month, then start ASA 81 mg QD  3. Hypertension - BP uncontrolled.  He is out of Amlodipine.  BP is usually well controlled at cardiac rehab.  Resume Amlodipine.  I have asked  him to call me next week with BP readings.      4. Cardiomyopathy - Suspect mixed ischemic/nonischemic (?ETOH related).  Cardiac MRI 2014 demonstrated EF 47%. Myoview in 3/17 with EF 33%. Follow-up echocardiogram in 6/17 demonstrated EF 55-60%.   5. Chronic Diastolic CHF - Volume stable.  NYHA 2-2b.  Continue current rx.   6. CKD - Creatinine has remained stable.   7. HL - Optimal LDL in 11/16.  8. OSA - He never got set up for CPAP. He was supposed to return for titration, but never did.  Will check with [facility]  cl[facility]. I suspect he will need a repeat study since his original study was in 4/16.     Medication Adjustments/Labs and Tests Ordered: Current medicines are reviewed at length with the patient today.  Concerns regarding medicines are outlined above.  Medication changes, Labs and Tests ordered today are outlined in the Patient Instructions noted below. Patient Instructions  Medication Instructions:  1. FINISH PLAVIX 2. THE MORNING AFTER YOU FINISH PLAVIX YOU WILL START ASPIRIN 81 MG DAILY 3. A REFILL FOR AMLODIPINE HAS BEEN SENT IN TODAY  Labwork: NONE  Testing/Procedures: NONE  Follow-Up: 08/03/16 @ 10:15 WITH Karry Causer, PAC   Any Other Special Instructions Will Be Listed Below (If Applicable). CHECK BLOOD PRESSURE A FEW TIMES NEXT WEEK; CALL IF BP IS ABOVE 140/90 ; [phone]  WE WILL HAVE BETHANY DILLARD, CMA TO CALL YOU ABOUT YOUR PREVIOUS SLEEP STUDY AS TO WHAT IS THE NEXT STEP SINCE YOU DID NOT HAVE TITRATION DONE WITH THE LAST SLEEP STUDY.   If you need a refill on your cardiac medications before your next appointment, please call your pharmacy.  Signed, Richardson Dopp, PA-C  05/08/2016 1:05 PM    [facility] [address], [place], [place]  [address] Phone: [phone]; Fax: [phone]

## 2016-05-08 ENCOUNTER — Encounter: Payer: Self-pay | Admitting: Physician Assistant

## 2016-05-08 ENCOUNTER — Encounter (INDEPENDENT_AMBULATORY_CARE_PROVIDER_SITE_OTHER): Payer: Self-pay

## 2016-05-08 ENCOUNTER — Ambulatory Visit (INDEPENDENT_AMBULATORY_CARE_PROVIDER_SITE_OTHER): Payer: Medicare Other | Admitting: Physician Assistant

## 2016-05-08 VITALS — BP 180/90 | HR 64 | Ht 71.0 in | Wt 213.0 lb

## 2016-05-08 DIAGNOSIS — I25119 Atherosclerotic heart disease of native coronary artery with unspecified angina pectoris: Secondary | ICD-10-CM

## 2016-05-08 DIAGNOSIS — I480 Paroxysmal atrial fibrillation: Secondary | ICD-10-CM

## 2016-05-08 DIAGNOSIS — N182 Chronic kidney disease, stage 2 (mild): Secondary | ICD-10-CM

## 2016-05-08 DIAGNOSIS — G4733 Obstructive sleep apnea (adult) (pediatric): Secondary | ICD-10-CM

## 2016-05-08 DIAGNOSIS — I2510 Atherosclerotic heart disease of native coronary artery without angina pectoris: Secondary | ICD-10-CM | POA: Diagnosis not present

## 2016-05-08 DIAGNOSIS — I10 Essential (primary) hypertension: Secondary | ICD-10-CM | POA: Diagnosis not present

## 2016-05-08 DIAGNOSIS — I5032 Chronic diastolic (congestive) heart failure: Secondary | ICD-10-CM

## 2016-05-08 DIAGNOSIS — E785 Hyperlipidemia, unspecified: Secondary | ICD-10-CM

## 2016-05-08 DIAGNOSIS — I429 Cardiomyopathy, unspecified: Secondary | ICD-10-CM | POA: Diagnosis not present

## 2016-05-08 MED ORDER — ASPIRIN EC 81 MG PO TBEC: 81.0000 mg | DELAYED_RELEASE_TABLET | Freq: Every day | ORAL | Status: DC

## 2016-05-08 MED ORDER — AMLODIPINE BESYLATE 5 MG PO TABS: 5.0000 mg | ORAL_TABLET | Freq: Every day | ORAL | 3 refills | Status: DC

## 2016-05-08 NOTE — Addendum Note (Signed)
Addended by: Michae Kava on: 05/08/2016 01:58 PM   Modules accepted: Orders

## 2016-05-08 NOTE — Patient Instructions (Addendum)
Medication Instructions:  1. FINISH PLAVIX 2. THE MORNING AFTER YOU FINISH PLAVIX YOU WILL START ASPIRIN 81 MG DAILY 3. A REFILL FOR AMLODIPINE HAS BEEN SENT IN TODAY  Labwork: NONE  Testing/Procedures: NONE  Follow-Up: 08/03/16 @ 10:15 WITH SCOTT WEAVER, PAC   Any Other Special Instructions Will Be Listed Below (If Applicable). CHECK BLOOD PRESSURE A FEW TIMES NEXT WEEK; CALL IF BP IS ABOVE 140/90 ; [phone]  WE WILL HAVE BETHANY DILLARD, CMA TO CALL YOU ABOUT YOUR PREVIOUS SLEEP STUDY AS TO WHAT IS THE NEXT STEP SINCE YOU DID NOT HAVE TITRATION DONE WITH THE LAST SLEEP STUDY.   If you need a refill on your cardiac medications before your next appointment, please call your pharmacy. ADDENDUM: PT NEEDS TO BE SCHEDULED FOR A SPLIT NIGHT SLEEP STUDY; I WILL HAVE BETHANY DILLARD, CMA CALL PT TO SCHEDULE.

## 2016-05-08 NOTE — Progress Notes (Signed)
He can see me at HF clinic for his diastolic CHF and multiple other issues.

## 2016-05-09 NOTE — Progress Notes (Signed)
Hi Heather,  I forwarded you a message from [facility]; see message below. I will call the pt though to let him know that it is ok to change over ASA 81 mg daily after he finishes the Plavix. If you guys would call him with an appt to see Kirk Ruths there that would be great.   Thank you Macario Carls, PA-C  Michae Kava, CMA        Change [name] FU to with Dr. Loralie Champagne in HF clinic instead of with me.  Tell him it is ok to change Plavix to ASA 81 mg QD as we discussed at his OV today.  Richardson Dopp, PA-C   05/08/2016 5:39 PM

## 2016-05-15 ENCOUNTER — Ambulatory Visit (INDEPENDENT_AMBULATORY_CARE_PROVIDER_SITE_OTHER): Payer: Medicare Other | Admitting: *Deleted

## 2016-05-15 DIAGNOSIS — Z7901 Long term (current) use of anticoagulants: Secondary | ICD-10-CM

## 2016-05-15 DIAGNOSIS — Z5181 Encounter for therapeutic drug level monitoring: Secondary | ICD-10-CM | POA: Diagnosis not present

## 2016-05-15 DIAGNOSIS — E119 Type 2 diabetes mellitus without complications: Secondary | ICD-10-CM | POA: Diagnosis not present

## 2016-05-15 DIAGNOSIS — I4891 Unspecified atrial fibrillation: Secondary | ICD-10-CM | POA: Diagnosis not present

## 2016-05-15 DIAGNOSIS — Z794 Long term (current) use of insulin: Secondary | ICD-10-CM

## 2016-05-15 LAB — POCT INR: INR: 2.1

## 2016-05-16 ENCOUNTER — Telehealth (HOSPITAL_COMMUNITY): Payer: Self-pay | Admitting: Cardiology

## 2016-05-16 NOTE — Telephone Encounter (Signed)
As requested message to CHF team scheduling to arrange followup with Dr Aundra Dubin in the [place] clinic with next available for [facility]

## 2016-05-16 NOTE — Telephone Encounter (Signed)
-----   Message from Michae Kava, [place] sent at 05/09/2016  3:44 PM EDT -----   ----- Message ----- From: Liliane Shi, PA-C Sent: 05/08/2016   5:38 PM To: Michae Kava, [place] FU to with Dr. Loralie Champagne in HF clinic instead of with me. Tell him it is ok to change Plavix to ASA 81 mg QD as we discussed at his OV today. Richardson Dopp, PA-C   05/08/2016 5:39 PM   ----- Message ----- From: Larey Dresser, MD Sent: 05/08/2016   4:28 PM To: Liliane Shi, PA-C    ----- Message ----- From: Liliane Shi, PA-C Sent: 05/08/2016   1:06 PM To: Larey Dresser, MD  1. Do you want him to follow up in HF Clinic or with [facility]?  2. I switched him from Plavix to ASA 81 (PCI was in 04/2015).  That ok? Scott

## 2016-05-25 ENCOUNTER — Other Ambulatory Visit: Payer: Self-pay | Admitting: Cardiology

## 2016-05-25 DIAGNOSIS — I10 Essential (primary) hypertension: Secondary | ICD-10-CM

## 2016-06-12 ENCOUNTER — Ambulatory Visit (INDEPENDENT_AMBULATORY_CARE_PROVIDER_SITE_OTHER): Payer: Medicare Other

## 2016-06-12 ENCOUNTER — Encounter (HOSPITAL_COMMUNITY): Payer: Self-pay

## 2016-06-12 ENCOUNTER — Ambulatory Visit (HOSPITAL_COMMUNITY)
Admission: RE | Admit: 2016-06-12 | Discharge: 2016-06-12 | Disposition: A | Discharge status: Home / Self Care | Payer: Medicare Other | Source: Ambulatory Visit | Attending: Cardiology | Admitting: Cardiology

## 2016-06-12 VITALS — BP 146/82 | HR 86 | Wt 213.5 lb

## 2016-06-12 DIAGNOSIS — I129 Hypertensive chronic kidney disease with stage 1 through stage 4 chronic kidney disease, or unspecified chronic kidney disease: Secondary | ICD-10-CM | POA: Diagnosis not present

## 2016-06-12 DIAGNOSIS — I2510 Atherosclerotic heart disease of native coronary artery without angina pectoris: Secondary | ICD-10-CM | POA: Insufficient documentation

## 2016-06-12 DIAGNOSIS — I480 Paroxysmal atrial fibrillation: Secondary | ICD-10-CM

## 2016-06-12 DIAGNOSIS — E7800 Pure hypercholesterolemia, unspecified: Secondary | ICD-10-CM | POA: Diagnosis not present

## 2016-06-12 DIAGNOSIS — Z5181 Encounter for therapeutic drug level monitoring: Secondary | ICD-10-CM | POA: Diagnosis not present

## 2016-06-12 DIAGNOSIS — Z7901 Long term (current) use of anticoagulants: Secondary | ICD-10-CM

## 2016-06-12 DIAGNOSIS — I4891 Unspecified atrial fibrillation: Secondary | ICD-10-CM | POA: Diagnosis not present

## 2016-06-12 DIAGNOSIS — Z794 Long term (current) use of insulin: Secondary | ICD-10-CM | POA: Diagnosis not present

## 2016-06-12 DIAGNOSIS — N189 Chronic kidney disease, unspecified: Secondary | ICD-10-CM | POA: Diagnosis not present

## 2016-06-12 DIAGNOSIS — E1122 Type 2 diabetes mellitus with diabetic chronic kidney disease: Secondary | ICD-10-CM | POA: Diagnosis not present

## 2016-06-12 DIAGNOSIS — Z79899 Other long term (current) drug therapy: Secondary | ICD-10-CM | POA: Diagnosis not present

## 2016-06-12 DIAGNOSIS — Z7982 Long term (current) use of aspirin: Secondary | ICD-10-CM | POA: Insufficient documentation

## 2016-06-12 DIAGNOSIS — I429 Cardiomyopathy, unspecified: Secondary | ICD-10-CM | POA: Diagnosis not present

## 2016-06-12 DIAGNOSIS — I5032 Chronic diastolic (congestive) heart failure: Secondary | ICD-10-CM

## 2016-06-12 DIAGNOSIS — G4733 Obstructive sleep apnea (adult) (pediatric): Secondary | ICD-10-CM | POA: Diagnosis not present

## 2016-06-12 LAB — BASIC METABOLIC PANEL
ANION GAP: 6 (ref 5–15)
BUN: 18 mg/dL (ref 6–20)
CALCIUM: 9.7 mg/dL (ref 8.9–10.3)
CO2: 25 mmol/L (ref 22–32)
CREATININE: 1.35 mg/dL — AB (ref 0.61–1.24)
Chloride: 106 mmol/L (ref 101–111)
GFR, EST NON AFRICAN AMERICAN: 55 mL/min — AB (ref >60)
Glucose, Bld: 154 mg/dL — ABNORMAL HIGH (ref 65–99)
Potassium: 3.9 mmol/L (ref 3.5–5.1)
Sodium: 137 mmol/L (ref 135–145)

## 2016-06-12 LAB — CBC
HCT: 39.8 % (ref 39.0–52.0)
HEMOGLOBIN: 13 g/dL (ref 13.0–17.0)
MCH: 26.4 pg (ref 26.0–34.0)
MCHC: 32.7 g/dL (ref 30.0–36.0)
MCV: 80.9 fL (ref 78.0–100.0)
PLATELETS: 301 10*3/uL (ref 150–400)
RBC: 4.92 MIL/uL (ref 4.22–5.81)
RDW: 14.3 % (ref 11.5–15.5)
WBC: 6.5 10*3/uL (ref 4.0–10.5)

## 2016-06-12 LAB — POCT INR: INR: 3.7

## 2016-06-12 NOTE — Progress Notes (Signed)
Patient ID: [name] [name], male   DOB: 02-Sep-1955, 60 y.o.   MRN: [id number] PCP: Dr. Doreene Burke Cardiology: Dr. Aundra Dubin  60 yo with history of HTN, diabetes, paroxysmal atrial fibrillation, and CAD presents for cardiology followup.  He was admitted in 5/13 with chest pain with exertion.  LHC was done showing global HK with EF 45% and diffuse, severe distal and branch vessel disease.  There was no interventional option, but I suspect that this disease could be causing his anginal-type pain.  Echo at that time was read as showing EF 60-65%.  He was admitted in 5/14 with hypertensive urgency and chest pain.  He ruled out for MI and BP was controlled.  His EF was 50-55% by echo.  ETT-Sestamibi done as outpatient showed small fixed inferior defect with no ischemia but EF was 31%.  He was quite hypertensive during the study.  Cardiac MRI done to confirm EF in 5/14 showed EF 44%.  He had an episode of atrial fibrillation/RVR in 3/15 but went back into NSR.  Echo in 2/16 showed EF 55-60% with moderate diastolic dysfunction.   He had a Cardiolite in 9/16 showing inferior ischemia.  He was having exertional dyspnea with less activity than normal.  I was concerned for worsened CAD, so took him for [facility] in 9/16. By RHC, filling pressures were not significantly elevated. He had a severe distal RCA stenosis that was treated with DES x 2.  Echo showed EF 55-60%.    In 10/16, he was admitted with acute cholecystitis.  He received a cholecystostomy tube.  Cholecystectomy done in 6/17.   Atrial fibrillation in 1/17, cardioverted to NSR.   Recurrent concerning chest pain in 3/17.  Repeat LHC showed diffuse distal and branch vessel disease consistent with poorly-controlled DM, but patent RCA stents.    Today, he remains in NSR.  No chest pain. No exertional dyspnea.  BP high today but did not take his medications this morning.  He has been out of atorvastatin. Weight is stable. He is not sure how much Lasix he is taking.    Labs (5/13): K 3.6, creatinine 0.93, LDL 85, HDL 57 Labs (7/13): K 3.9, creatinine 1.1, LDL 57, HDL 51  Labs (5/14): K 4, creatinine 1.25 Labs (8/14): K 3.7, creatinine 0.9, LDL 103, HDL 42 Labs (8/15): K 3.5 creatinine 1.0, BNP 75 Labs (2/16): K 3.8, creatinine 1.1 Labs (9/16): K 3.5, creatinine 1.03, BNP 59, HDL 40, LDL 111, TGs 333 Labs (11/16): K 3.6, creaitnine 1.07, LFTs normal, LDL 44 Labs (6/17): K 3.4, creatinine 1.52 => 1.13  ECG: NSR, LVH, inferior and anterolateral T wave inversions.   PMH: 1. DM2 2. Gout 3. HTN: Cough with ACEI.  4. Cardiomyopathy: Suspect mixed ischemic/nonischemic (?ETOH-related).  EF 35% in 2008.  Echo in 5/13 showed EF 60-65% with moderate LVH but EF was 45% on LV-gram in 5/13.  Echo (5/14) with EF 50-55%, mild LVH, inferobasal HK, mild MR.  ETT-Sestamibi in 5/14, however, showed EF 31%.  Cardiac MRI (6/14) with EF 44%, mild global hypokinesis, subepicardial delayed enhancement in a nonspecific RV insertion site pattern.  Echo (2/16) with EF 55-60%, moderate diastolic dysfunction. RHC (9/16) with mean RA 8, PA 39/20 mean 28, mean PCWP 14, CI 2.2. Echo (9/16) with EF 55-60%, grade II diastolic dysfunction.  - Echo (6/17) with EF 55-60%, moderate LVH, grade II diastolic dysfunction, mild AI, mild MR.  5. CAD: LHC in 2010 with mild nonobstructive disease.  LHC (5/13) with diffuse distal  and branch vessel disease, mild global hypokinesis and EF 45%.  ETT-Sestamibi (5/14): EF 31%, small fixed inferior defect with no ischemia.  Lexiscan Cardiolite (9/16) with EF 26%, moderate inferior defect that was partially reversible, suggesting ischemia => High risk study. LHC (9/16) with 40-50% mLAD, diffuse up to 50% distal LAD, 90% ostium of branch off ramus, 95% dRCA => DES to RCA x 2, EF 55-60%.   - LHC (3/17) with diffuse branch and distal vessel disease c/w poorly controlled DM, RCA stents patent.  6. H/o chronic HBV 7. Osteochondroma left shoulder.  8.  Hyperlipidemia 9. Paroxysmal atrial fibrillation: Coumadin.  Developed cough and increased ESR with amiodarone.  DCCV in 1/17.  10. Atrial flutter: had ablations in 2007 and 2008.  11. OSA on CPAP.  12. Acute cholecystitis (10/16): Cholecystostomy tube placed.  Cholecystectomy 6/17.  13. CKD  SH: Married, prior smoker.  Some ETOH, occasionally heavy in past.  Does use occasional marijuana. Out of work Engineer, drilling. 2 daughters in grad school.   FH: CAD  ROS: All systems reviewed and negative except as per HPI.   Current Outpatient Prescriptions  Medication Sig Dispense Refill  . amLODipine (NORVASC) 5 MG tablet Take 1 tablet (5 mg total) by mouth daily. 90 tablet 3  . aspirin EC 81 MG tablet Take 1 tablet (81 mg total) by mouth daily. DO NOT START UNTIL YOU FINISH PLAVIX    . carvedilol (COREG) 12.5 MG tablet Take 1 tablet (12.5 mg total) by mouth 2 (two) times daily with a meal. 180 tablet 3  . furosemide (LASIX) 20 MG tablet Take 2 tablets in the morning and 1 in the evening. 90 tablet 2  . hydrALAZINE (APRESOLINE) 50 MG tablet Take 50 mg by mouth 2 (two) times daily.    . Insulin Glargine (LANTUS SOLOSTAR) 100 UNIT/ML Solostar Pen Inject 50 Units into the skin every morning. And pen needles 1/day 30 mL 3  . metFORMIN (GLUCOPHAGE) 1000 MG tablet Take 1 tablet (1,000 mg total) by mouth 2 (two) times daily with a meal. 180 tablet 3  . oxyCODONE (OXY IR/ROXICODONE) 5 MG immediate release tablet Take 1-2 tablets (5-10 mg total) by mouth every 4 (four) hours as needed for moderate pain. 30 tablet 0  . potassium chloride SA (K-DUR,KLOR-CON) 20 MEQ tablet Take 10 mEq by mouth every other day.    . spironolactone (ALDACTONE) 25 MG tablet Take 1 tablet (25 mg total) by mouth daily. 90 tablet 3  . warfarin (COUMADIN) 5 MG tablet TAKE AS DIRECTED BY COUMADIN CLINIC 90 tablet 1  . acetaminophen (TYLENOL) 500 MG tablet Take 1,000 mg by mouth every 6 (six) hours as needed (pain). Reported on 08/18/2015     . nitroGLYCERIN (NITROSTAT) 0.4 MG SL tablet Place 1 tablet (0.4 mg total) under the tongue every 5 (five) minutes as needed for chest pain (x 3 tabs daily). Reported on 08/18/2015 (Patient not taking: Reported on 06/12/2016) 100 tablet 1   No current facility-administered medications for this encounter.     BP (!) 146/82   Pulse 86   Wt 213 lb 8 oz (96.8 kg)   SpO2 100%   BMI 29.78 kg/m  General: NAD Neck: JVP 7 cm, no thyromegaly or thyroid nodule.  Lungs: Clear to auscultation bilaterally with normal respiratory effort. CV: Nondisplaced PMI.  Heart regular S1/S2, no S3/S4, no murmur.  No peripheral edema.  No carotid bruit.  Normal pedal pulses.  Abdomen: Soft, nontender, no hepatosplenomegaly, no distention.  Neurologic:  Alert and oriented x 3.  Psych: Normal affect. Extremities: No clubbing or cyanosis.   Assessment/Plan:  Atrial fibrillation Paroxysmal atrial fibrillation, continue coumadin. Now off Plavix, can change INR goal to 2-3.  He is in NSR today. CBC today.  Coronary artery disease LHC in 9/16 showed diffuse CAD, most significant involving the distal RCA.  Patient had DES x 2 to RCA.  Repeat cath in 3/17 with patent RCA stents and diffuse distal and branch vessel disease concerning for poorly controlled diabetes. - Now off Plavix and continues to warfarin with INR 2-3.  - Restart statin (out for a couple of weeks).  HYPERCHOLESTEROLEMIA  Good lipids in 11/16.  Out of statin for a couple of weeks.  I will restart atorvastatin.  Cardiomyopathy  EF 47% on cardiac MRI in 6/14 but EF improved back to 55-60% on 2/16 echo.  However, EF down to 26% on Cardiolite in 9/16.  I think this was inaccurate.  EF was 55-60% by both LV-gram and echo in 9/16 and 55-60% on 6/17 echo.  He looks euvolemic on exam.  - He is not sure what his Lasix dose is.  He will call back to let us know.  I will continue the current dose, however.  - Check BMET today.  Hypertension BP high but did not  take am meds.  OSA Needs sleep study, he is going to call to set up.  CKD BMET today.   Followup in 3 months.   Loralie Champagne 06/12/2016

## 2016-06-12 NOTE — Patient Instructions (Signed)
PLEASE CALL us BACK AND LET us KNOW WHAT DOSE OF FUROSEMIDE (LASIX) YOU ARE TAKING  Labs today  You are scheduled for sleep study on 07/04/16  Your physician wants you to follow-up in: 4 months You will receive a reminder letter in the mail two months in advance. If you don't receive a letter, please call our office to schedule the follow-up appointment.

## 2016-06-13 ENCOUNTER — Telehealth (HOSPITAL_COMMUNITY): Payer: Self-pay

## 2016-06-13 NOTE — Telephone Encounter (Signed)
Patient called CHF clinic triage to verify his lasix dose after recent apt with Dr. Aundra Dubin. Patient reports he is taking 20 mg lasix once every day. Will forward to Dr. Aundra Dubin.  Renee Pain, RN

## 2016-06-13 NOTE — Telephone Encounter (Signed)
Please change on his med list.

## 2016-06-14 MED ORDER — FUROSEMIDE 20 MG PO TABS: 20.0000 mg | ORAL_TABLET | Freq: Every day | ORAL | 2 refills | Status: DC

## 2016-06-14 NOTE — Telephone Encounter (Signed)
Med list updated

## 2016-06-26 ENCOUNTER — Ambulatory Visit (INDEPENDENT_AMBULATORY_CARE_PROVIDER_SITE_OTHER): Payer: Medicare Other | Admitting: Pharmacist

## 2016-06-26 DIAGNOSIS — Z7901 Long term (current) use of anticoagulants: Secondary | ICD-10-CM | POA: Diagnosis not present

## 2016-06-26 DIAGNOSIS — Z5181 Encounter for therapeutic drug level monitoring: Secondary | ICD-10-CM

## 2016-06-26 DIAGNOSIS — I4891 Unspecified atrial fibrillation: Secondary | ICD-10-CM | POA: Diagnosis not present

## 2016-06-26 DIAGNOSIS — I25119 Atherosclerotic heart disease of native coronary artery with unspecified angina pectoris: Secondary | ICD-10-CM

## 2016-06-26 LAB — POCT INR: INR: 2.4

## 2016-07-04 ENCOUNTER — Ambulatory Visit (HOSPITAL_BASED_OUTPATIENT_CLINIC_OR_DEPARTMENT_OTHER): Payer: Medicare Other | Attending: Physician Assistant

## 2016-07-18 ENCOUNTER — Ambulatory Visit (INDEPENDENT_AMBULATORY_CARE_PROVIDER_SITE_OTHER): Payer: Medicare Other | Admitting: *Deleted

## 2016-07-18 DIAGNOSIS — Z5181 Encounter for therapeutic drug level monitoring: Secondary | ICD-10-CM | POA: Diagnosis not present

## 2016-07-18 DIAGNOSIS — I25119 Atherosclerotic heart disease of native coronary artery with unspecified angina pectoris: Secondary | ICD-10-CM

## 2016-07-18 DIAGNOSIS — I4891 Unspecified atrial fibrillation: Secondary | ICD-10-CM

## 2016-07-18 DIAGNOSIS — Z7901 Long term (current) use of anticoagulants: Secondary | ICD-10-CM | POA: Diagnosis not present

## 2016-07-18 LAB — POCT INR: INR: 3.7

## 2016-08-03 ENCOUNTER — Ambulatory Visit (INDEPENDENT_AMBULATORY_CARE_PROVIDER_SITE_OTHER): Payer: Medicare Other | Admitting: Physician Assistant

## 2016-08-03 ENCOUNTER — Encounter (INDEPENDENT_AMBULATORY_CARE_PROVIDER_SITE_OTHER): Payer: Self-pay

## 2016-08-03 ENCOUNTER — Ambulatory Visit (INDEPENDENT_AMBULATORY_CARE_PROVIDER_SITE_OTHER): Payer: Medicare Other | Admitting: *Deleted

## 2016-08-03 ENCOUNTER — Encounter: Payer: Self-pay | Admitting: Physician Assistant

## 2016-08-03 ENCOUNTER — Observation Stay (HOSPITAL_COMMUNITY)
Admission: AD | Admit: 2016-08-03 | Discharge: 2016-08-05 | Disposition: A | Discharge status: Home / Self Care | Payer: Medicare Other | Source: Ambulatory Visit | Attending: Interventional Cardiology | Admitting: Interventional Cardiology

## 2016-08-03 ENCOUNTER — Encounter (HOSPITAL_COMMUNITY): Payer: Self-pay | Admitting: General Practice

## 2016-08-03 VITALS — BP 160/94 | HR 101 | Ht 71.0 in | Wt 206.0 lb

## 2016-08-03 DIAGNOSIS — I420 Dilated cardiomyopathy: Secondary | ICD-10-CM

## 2016-08-03 DIAGNOSIS — I481 Persistent atrial fibrillation: Secondary | ICD-10-CM

## 2016-08-03 DIAGNOSIS — I4819 Other persistent atrial fibrillation: Secondary | ICD-10-CM | POA: Diagnosis present

## 2016-08-03 DIAGNOSIS — I4891 Unspecified atrial fibrillation: Secondary | ICD-10-CM

## 2016-08-03 DIAGNOSIS — I10 Essential (primary) hypertension: Secondary | ICD-10-CM | POA: Diagnosis not present

## 2016-08-03 DIAGNOSIS — Z7901 Long term (current) use of anticoagulants: Secondary | ICD-10-CM | POA: Insufficient documentation

## 2016-08-03 DIAGNOSIS — Z87891 Personal history of nicotine dependence: Secondary | ICD-10-CM | POA: Diagnosis not present

## 2016-08-03 DIAGNOSIS — I25110 Atherosclerotic heart disease of native coronary artery with unstable angina pectoris: Secondary | ICD-10-CM | POA: Diagnosis not present

## 2016-08-03 DIAGNOSIS — Z7984 Long term (current) use of oral hypoglycemic drugs: Secondary | ICD-10-CM | POA: Diagnosis not present

## 2016-08-03 DIAGNOSIS — E119 Type 2 diabetes mellitus without complications: Secondary | ICD-10-CM | POA: Diagnosis not present

## 2016-08-03 DIAGNOSIS — I5032 Chronic diastolic (congestive) heart failure: Secondary | ICD-10-CM | POA: Diagnosis not present

## 2016-08-03 DIAGNOSIS — E7800 Pure hypercholesterolemia, unspecified: Secondary | ICD-10-CM | POA: Diagnosis present

## 2016-08-03 DIAGNOSIS — I110 Hypertensive heart disease with heart failure: Secondary | ICD-10-CM | POA: Diagnosis not present

## 2016-08-03 DIAGNOSIS — Z5181 Encounter for therapeutic drug level monitoring: Secondary | ICD-10-CM

## 2016-08-03 DIAGNOSIS — Z794 Long term (current) use of insulin: Secondary | ICD-10-CM

## 2016-08-03 DIAGNOSIS — R079 Chest pain, unspecified: Secondary | ICD-10-CM | POA: Diagnosis present

## 2016-08-03 DIAGNOSIS — N182 Chronic kidney disease, stage 2 (mild): Secondary | ICD-10-CM | POA: Diagnosis not present

## 2016-08-03 DIAGNOSIS — I200 Unstable angina: Secondary | ICD-10-CM | POA: Diagnosis present

## 2016-08-03 DIAGNOSIS — Z7982 Long term (current) use of aspirin: Secondary | ICD-10-CM | POA: Diagnosis not present

## 2016-08-03 DIAGNOSIS — I509 Heart failure, unspecified: Secondary | ICD-10-CM | POA: Diagnosis not present

## 2016-08-03 DIAGNOSIS — I25119 Atherosclerotic heart disease of native coronary artery with unspecified angina pectoris: Secondary | ICD-10-CM | POA: Diagnosis not present

## 2016-08-03 DIAGNOSIS — R748 Abnormal levels of other serum enzymes: Secondary | ICD-10-CM

## 2016-08-03 LAB — CBC WITH DIFFERENTIAL/PLATELET
Basophils Absolute: 207 cells/uL — ABNORMAL HIGH (ref 0–200)
Basophils Relative: 3 %
EOS PCT: 2 %
Eosinophils Absolute: 138 cells/uL (ref 15–500)
HEMATOCRIT: 42.8 % (ref 38.5–50.0)
HEMOGLOBIN: 13.5 g/dL (ref 13.2–17.1)
LYMPHS ABS: 1518 {cells}/uL (ref 850–3900)
Lymphocytes Relative: 22 %
MCH: 25.2 pg — ABNORMAL LOW (ref 27.0–33.0)
MCHC: 31.5 g/dL — ABNORMAL LOW (ref 32.0–36.0)
MCV: 79.9 fL — ABNORMAL LOW (ref 80.0–100.0)
MPV: 9.9 fL (ref 7.5–12.5)
Monocytes Absolute: 690 cells/uL (ref 200–950)
Monocytes Relative: 10 %
NEUTROS ABS: 4347 {cells}/uL (ref 1500–7800)
Neutrophils Relative %: 63 %
Platelets: 216 10*3/uL (ref 140–400)
RBC: 5.36 MIL/uL (ref 4.20–5.80)
RDW: 15.6 % — ABNORMAL HIGH (ref 11.0–15.0)
WBC: 6.9 10*3/uL (ref 3.8–10.8)

## 2016-08-03 LAB — BASIC METABOLIC PANEL
BUN: 16 mg/dL (ref 7–25)
CHLORIDE: 97 mmol/L — AB (ref 98–110)
CO2: 25 mmol/L (ref 20–31)
Calcium: 9.2 mg/dL (ref 8.6–10.3)
Creat: 1.2 mg/dL (ref 0.70–1.25)
GLUCOSE: 429 mg/dL — AB (ref 65–99)
POTASSIUM: 4 mmol/L (ref 3.5–5.3)
SODIUM: 132 mmol/L — AB (ref 135–146)

## 2016-08-03 LAB — TROPONIN I
TROPONIN I: 0.04 ng/mL — AB (ref <0.03)
Troponin I: 0.04 ng/mL (ref <0.03)
Troponin I: 0.03 ng/mL (ref <0.03)

## 2016-08-03 LAB — GLUCOSE, CAPILLARY
GLUCOSE-CAPILLARY: 372 mg/dL — AB (ref 65–99)
GLUCOSE-CAPILLARY: 261 mg/dL — AB (ref 65–99)
Glucose-Capillary: 428 mg/dL — ABNORMAL HIGH (ref 65–99)
Glucose-Capillary: 125 mg/dL — ABNORMAL HIGH (ref 65–99)

## 2016-08-03 LAB — HEPATIC FUNCTION PANEL
ALBUMIN: 3.8 g/dL (ref 3.6–5.1)
ALK PHOS: 75 U/L (ref 40–115)
ALT: 14 U/L (ref 9–46)
AST: 17 U/L (ref 10–35)
BILIRUBIN DIRECT: 0.2 mg/dL (ref <=0.2)
BILIRUBIN TOTAL: 0.7 mg/dL (ref 0.2–1.2)
Indirect Bilirubin: 0.5 mg/dL (ref 0.2–1.2)
Total Protein: 7.7 g/dL (ref 6.1–8.1)

## 2016-08-03 LAB — POCT INR: INR: 2.5

## 2016-08-03 MED ORDER — ACETAMINOPHEN 325 MG PO TABS
650.0000 mg | ORAL_TABLET | Freq: Once | ORAL | Status: AC
  Administered 2016-08-03: 650 mg via ORAL
  Filled 2016-08-03: qty 2

## 2016-08-03 MED ORDER — FUROSEMIDE 20 MG PO TABS
20.0000 mg | ORAL_TABLET | Freq: Every evening | ORAL | Status: DC
  Administered 2016-08-04: 20 mg via ORAL
  Filled 2016-08-03 (×2): qty 1

## 2016-08-03 MED ORDER — ATORVASTATIN CALCIUM 80 MG PO TABS
80.0000 mg | ORAL_TABLET | Freq: Every day | ORAL | Status: DC
  Administered 2016-08-03 – 2016-08-04 (×2): 80 mg via ORAL
  Filled 2016-08-03 (×2): qty 1

## 2016-08-03 MED ORDER — CARVEDILOL 12.5 MG PO TABS: 18.7500 mg | ORAL_TABLET | Freq: Two times a day (BID) | ORAL | 3 refills | Status: DC

## 2016-08-03 MED ORDER — INSULIN ASPART 100 UNIT/ML ~~LOC~~ SOLN: 0.0000 [IU] | Freq: Every day | SUBCUTANEOUS | Status: DC

## 2016-08-03 MED ORDER — FUROSEMIDE 40 MG PO TABS
40.0000 mg | ORAL_TABLET | ORAL | Status: DC
  Administered 2016-08-03 – 2016-08-05 (×3): 40 mg via ORAL
  Filled 2016-08-03 (×2): qty 1

## 2016-08-03 MED ORDER — WARFARIN - PHARMACIST DOSING INPATIENT
Freq: Every day | Status: DC
Start: 2016-08-03 — End: 2016-08-05

## 2016-08-03 MED ORDER — ONDANSETRON HCL 4 MG/2ML IJ SOLN: 4.0000 mg | Freq: Four times a day (QID) | INTRAMUSCULAR | Status: DC | PRN

## 2016-08-03 MED ORDER — SPIRONOLACTONE 25 MG PO TABS
25.0000 mg | ORAL_TABLET | Freq: Every day | ORAL | Status: DC
  Administered 2016-08-03 – 2016-08-05 (×3): 25 mg via ORAL
  Filled 2016-08-03 (×3): qty 1

## 2016-08-03 MED ORDER — INSULIN ASPART 100 UNIT/ML ~~LOC~~ SOLN
0.0000 [IU] | Freq: Three times a day (TID) | SUBCUTANEOUS | Status: DC
  Administered 2016-08-03: 20 [IU] via SUBCUTANEOUS

## 2016-08-03 MED ORDER — METFORMIN HCL 500 MG PO TABS
1000.0000 mg | ORAL_TABLET | Freq: Two times a day (BID) | ORAL | Status: DC
  Administered 2016-08-03 – 2016-08-05 (×4): 1000 mg via ORAL
  Filled 2016-08-03 (×4): qty 2

## 2016-08-03 MED ORDER — AMLODIPINE BESYLATE 5 MG PO TABS
5.0000 mg | ORAL_TABLET | Freq: Every day | ORAL | Status: DC
  Administered 2016-08-03 – 2016-08-05 (×3): 5 mg via ORAL
  Filled 2016-08-03 (×3): qty 1

## 2016-08-03 MED ORDER — CARVEDILOL 6.25 MG PO TABS
18.7500 mg | ORAL_TABLET | Freq: Two times a day (BID) | ORAL | Status: DC
  Administered 2016-08-03 – 2016-08-05 (×4): 18.75 mg via ORAL
  Filled 2016-08-03 (×4): qty 1

## 2016-08-03 MED ORDER — NITROGLYCERIN 0.4 MG SL SUBL: 0.4000 mg | SUBLINGUAL_TABLET | SUBLINGUAL | Status: DC | PRN

## 2016-08-03 MED ORDER — ASPIRIN EC 81 MG PO TBEC
81.0000 mg | DELAYED_RELEASE_TABLET | Freq: Every day | ORAL | Status: DC
  Administered 2016-08-04 – 2016-08-05 (×2): 81 mg via ORAL
  Filled 2016-08-03 (×2): qty 1

## 2016-08-03 MED ORDER — ACETAMINOPHEN 500 MG PO TABS: 1000.0000 mg | ORAL_TABLET | Freq: Four times a day (QID) | ORAL | Status: DC | PRN

## 2016-08-03 MED ORDER — INFLUENZA VAC SPLIT QUAD 0.5 ML IM SUSY
0.5000 mL | PREFILLED_SYRINGE | INTRAMUSCULAR | Status: AC
  Administered 2016-08-04: 0.5 mL via INTRAMUSCULAR
  Filled 2016-08-03: qty 0.5

## 2016-08-03 MED ORDER — POTASSIUM CHLORIDE CRYS ER 10 MEQ PO TBCR: 10.0000 meq | EXTENDED_RELEASE_TABLET | ORAL | Status: DC

## 2016-08-03 MED ORDER — INSULIN GLARGINE 100 UNIT/ML ~~LOC~~ SOLN
30.0000 [IU] | Freq: Every day | SUBCUTANEOUS | Status: DC
  Administered 2016-08-04 – 2016-08-05 (×2): 30 [IU] via SUBCUTANEOUS
  Filled 2016-08-03 (×2): qty 0.3

## 2016-08-03 MED ORDER — FUROSEMIDE 20 MG PO TABS: 20.0000 mg | ORAL_TABLET | Freq: Every day | ORAL | Status: DC

## 2016-08-03 MED ORDER — INSULIN ASPART 100 UNIT/ML ~~LOC~~ SOLN
0.0000 [IU] | Freq: Three times a day (TID) | SUBCUTANEOUS | Status: DC
  Administered 2016-08-03: 5 [IU] via SUBCUTANEOUS
  Administered 2016-08-04: 11 [IU] via SUBCUTANEOUS
  Administered 2016-08-04: 3 [IU] via SUBCUTANEOUS
  Administered 2016-08-04: 20 [IU] via SUBCUTANEOUS
  Administered 2016-08-05: 7 [IU] via SUBCUTANEOUS

## 2016-08-03 MED ORDER — OXYCODONE HCL 5 MG PO TABS: 5.0000 mg | ORAL_TABLET | ORAL | Status: DC | PRN

## 2016-08-03 MED ORDER — WARFARIN SODIUM 2.5 MG PO TABS
2.5000 mg | ORAL_TABLET | ORAL | Status: DC
  Administered 2016-08-04: 2.5 mg via ORAL
  Filled 2016-08-03: qty 1

## 2016-08-03 MED ORDER — WARFARIN SODIUM 5 MG PO TABS: 5.0000 mg | ORAL_TABLET | Freq: Every day | ORAL | Status: DC

## 2016-08-03 MED ORDER — INSULIN GLARGINE 100 UNIT/ML ~~LOC~~ SOLN
50.0000 [IU] | SUBCUTANEOUS | Status: DC
  Filled 2016-08-03: qty 0.5

## 2016-08-03 MED ORDER — SODIUM CHLORIDE 0.9% FLUSH: 3.0000 mL | INTRAVENOUS | Status: DC | PRN

## 2016-08-03 MED ORDER — SODIUM CHLORIDE 0.9% FLUSH
3.0000 mL | Freq: Two times a day (BID) | INTRAVENOUS | Status: DC
  Administered 2016-08-03 – 2016-08-04 (×4): 3 mL via INTRAVENOUS

## 2016-08-03 MED ORDER — HYDRALAZINE HCL 50 MG PO TABS
50.0000 mg | ORAL_TABLET | Freq: Two times a day (BID) | ORAL | Status: DC
  Administered 2016-08-03 – 2016-08-05 (×5): 50 mg via ORAL
  Filled 2016-08-03 (×5): qty 1

## 2016-08-03 MED ORDER — INSULIN GLARGINE 100 UNIT/ML SOLOSTAR PEN: 50.0000 [IU] | PEN_INJECTOR | SUBCUTANEOUS | Status: DC

## 2016-08-03 MED ORDER — WARFARIN SODIUM 5 MG PO TABS
5.0000 mg | ORAL_TABLET | ORAL | Status: DC
  Administered 2016-08-03: 5 mg via ORAL
  Filled 2016-08-03: qty 1

## 2016-08-03 MED ORDER — SODIUM CHLORIDE 0.9 % IV SOLN: 250.0000 mL | INTRAVENOUS | Status: DC | PRN

## 2016-08-03 NOTE — Patient Instructions (Addendum)
Medication Instructions:  Your physician has recommended you make the following change in your medication:  1. Increase Metoprolol to one and one half tablet (18.75mg ) twice daily  Labwork: Your physician recommends that you have lab work STAT [facility]OP[facility], [place].  Testing/Procedures: -None  Follow-Up: Your physician recommends that you keep your scheduled  follow-up appointment with Richardson Dopp, PA-C on Tuesday @ 8:45 am.  Any Other Special Instructions Will Be Listed Below (If Applicable).  If you need a refill on your cardiac medications before your next appointment, please call your pharmacy.

## 2016-08-03 NOTE — Discharge Instructions (Addendum)

## 2016-08-03 NOTE — Progress Notes (Signed)
ANTICOAGULATION CONSULT NOTE - Initial Consult  Pharmacy Consult for Coumadin Indication: atrial fibrillation  Allergies  Allergen Reactions  . Ace Inhibitors Cough  . Other Hives and Other (See Comments)    Patient reports developing hives after receiving "some antibiotic given in 1980''s at [facility]". He does not know which antibiotic.    Patient Measurements: Height: 5\' 11"  (180.3 cm) Weight: 202 lb 9.6 oz (91.9 kg) IBW/kg (Calculated) : 75.3  Vital Signs: Temp: 98.1 F (36.7 C) (12/22 1357) Temp Source: Oral (12/22 1357) BP: 163/86 (12/22 1357) Pulse Rate: 104 (12/22 1357)  Labs:  Recent Labs  08/03/16 1009 08/03/16 1105 08/03/16 1503  HGB  --  13.5  --   HCT  --  42.8  --   PLT  --  216  --   INR 2.5  --   --   TROPONINI  --  0.04* 0.03*    CrCl cannot be calculated (Patient's most recent lab result is older than the maximum 21 days allowed.).   Medical History: Past Medical History:  Diagnosis Date  . Anginal pain ([place])   . Anxiety   . Arrhythmia   . CAD (coronary artery disease), native coronary artery    a. Nonobstructive CAD by cath 2013 - diffuse distal and branch vessel CAD, no severe disease in the major coronaries, LV mild global hypokinesis, EF 45%. b. ETT-Sestamibi 5/14: EF 31%, small fixed inferior defect with no ischemia.  . Cholecystitis   . Chronic CHF ([place])    a. Mixed ICM/NICM (?EtOH). EF 35% in 2008. Echo 5/13: EF 60-65%, mod LVH, EF 45% on V gram in 12/2011. EF 12/2012: EF 50-55%, mild LVH, inferobasal HK, mild MR. ETT-Ses 5/14 EF 41%. Cardiac MRI 5/14: EF 44%, mild global HK, subepicardial delayed enhancement in nonspecific RV insertion pattern.  . COLONIC POLYPS, HX OF 12/30/2006  . Gout   . H/O atrial flutter 2007   a. Ablations in 2007, 2008.  [name] Heart murmur   . HEPATITIS B, CHRONIC 12/30/2006  . History of alcohol abuse   . History of hiatal hernia   . History of medication noncompliance   . History of nuclear stress  test    Myoview 3/17: + chest pain; EF 33%, downsloping ST depression 2, V4-6, LVH with strain, no ischemia on images; intermediate risk   . HYPERCHOLESTEROLEMIA 07/11/2010  . Hypertension   . Left sciatic nerve pain since 04/2015  . LIVER FUNCTION TESTS, ABNORMAL, HX OF 12/30/2006  . MITRAL REGURGITATION 12/30/2006  . 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 PAF (paroxysmal atrial fibrillation) ([facility])    On coumadin  . Rectal bleeding 12/18/2011   Scheduled for colonoscopy.    . Shortness of breath dyspnea    with excertion  . Sleep apnea    "suppose to send mask but they never did" (05/03/2015)  . Type II diabetes mellitus [facility])    Assessment:   60 yr old male admitted with chest pain.  Takes Coumadin for afib.   INR therapeutic (2.5) on home regimen of 5 mg MWFSun and 2.5 mg TTSat.   He has not taken today's dose yet (takes at bedtime at home).  Goal of Therapy:  INR 2-3 Monitor platelets by anticoagulation protocol: Yes   Plan:   Continue home Coumadin regimen: 5 mg MWFSun and 2.5 mg TTSat.  Daily PT/INR for now.  Arty Baumgartner, [place] Pager: [phone] 08/03/2016,5:27 PM

## 2016-08-03 NOTE — Progress Notes (Signed)
Cardiology Office Note:    Date:  08/03/2016   ID:  [name] [name], DOB Jan 08, 1956, MRN [id number]  PCP:  Angelica Chessman, MD  Cardiologist:Dr. Kirk Ruths McLean(CHF clinic) Electrophysiologist: N/a Gen Surgeon: Dr. Redmond Pulling  Referring MD: Tresa Garter, MD   Chief Complaint  Patient presents with  . Chest Pain    History of Present Illness:    [name] [name] is a 60 y.o. male with a hx of HTN, DM, PAF, CAD, CKD. LHC in 5/13 demonstrated severe distal and branch vessel disease and he was treated with med Rx.  Echo in 5/13 demonstrated normal LVF. Cardiac MRI in 2014 demonstrated EF 44%. In 10/2013 he had an episode of AFib with RVR. Echo 2/16 demonstrated EF 55-60% and moderate diastolic dysfunction. Myoview in 9/16 demonstrated inferior ischemia. He underwent right and left-sided heart catheterization. Filling pressures were not significantly elevated. He did have severe distal RCA stenosis that was treated with DES 2. Repeat echo 9/16 demonstrated EF 55-60% and moderate diastolic dysfunction. He was admitted in 10/16 with acute cholecystitis s/p cholecystotomy tube.  Cholecystectomy was delayed due to the need for antiplatelet Rx after PCI with DES.  He was admitted in 0000000 with a/c diastolic CHF in the setting of AFlutter and underwent DCCV.  In 3/17 a stress test showed significant ST changes (no ischemia) and a LHC demonstrated extensive, diffuse CAD c/w poorly controlled diabetes and patent RCA stents.  Med Rx was continued.  He eventually underwent lap cholecystectomy in [id number] without complications.  Last seen here in 9/17 by me.  He also followed up with Dr. Loralie Champagne in 10/17 in the [facility] Clinic.    He returns for routine cardiology follow-up. He tells me that yesterday he went to work for the first time in a long time. He lays bricks. Later in the shift, he started getting cramping in his hands and chest discomfort described as tightness. His discomfort has lasted  since that time (4 PM yesterday). He notes associated dyspnea, nausea and diaphoresis. He vomited twice. He feels that his symptoms are improving. He does not feel like these are his symptoms of angina. He denies syncope. Denies orthopnea, PND or edema. He did not take nitroglycerin. He does admit to missing several medications for a couple of days earlier this week.  Prior CV studies that were reviewed today include:    Echo 01/13/16 Severe focal basal and mod conc LVH, EF 55-60%, no RWMA, Gr 2 DD, mild AI, mild MR, mild LAE  LHC 11/04/15 LM 20% distal  LAD 40% ostial, 40% proximal, Diffuse moderate distal vessel disease.  RI  50% ostial/proximal, 60-70% mid, Diffuse distal and branch vessel disease.  LCx - AV LCx was relatively small with diffuse mild-moderate disease.  RCA -  Patent ostial and distal RCA stents, 40% mid, Diffuse mild to moderate distal disease. Extensive CAD but no severe/critical lesions. He has diffuse disease consistent with his poorly controlled diabetes. Stents in the RCA are patent. Continue medical management.   Myoview 11/01/15 + chest pain; EF 33%, downsloping ST depression 2, V4-6, LVH with strain, no ischemia on images; intermediate risk   R/L [facility] 05/03/15 PCI: 2.5 x 16 Synergy DES to the distal RCA, 3 x 12 Synergy DES to the proximal RCA  Myoview 9/16 Partially reversible (SDS 4) moderate-sized and intensity inferior perfusion defect, which suggests ischemia, however patient motion noted during the study and mis-registration artifact is possible. Dilated LV with severe hypokinesis - LVEF 26%. This  is a high risk study.  Echo 9/16 Mild LVH, EF 55-60%, normal wall motion, grade 2 diastolic dysfunction, trivial AI, mild MR, moderate LAE, normal RVSF, mild TR  LHC (5/13):  LHC (5/13) with diffuse distal and branch vessel disease, mild global hypokinesis and EF 45%  Echo (5/14):  inferbasal HK, mild LVH, EF 50-55%, trivial AI, mild MR, mild  LAE  Nuclear (5/14):  Inf defect c/w thinning vs infarct, no ischemia, EF 31%; High Risk  cMRI (5/14):  Mild LVH, EF 44%, normal RV size and function, small areas of subepicardial delayed enhancement of the mid anteroseptal and mid inferoseptal RV insertion sites-nonspecific finding  Past Medical History:  Diagnosis Date  . Anginal pain ([place])   . Anxiety   . Arrhythmia   . CAD (coronary artery disease), native coronary artery    a. Nonobstructive CAD by cath 2013 - diffuse distal and branch vessel CAD, no severe disease in the major coronaries, LV mild global hypokinesis, EF 45%. b. ETT-Sestamibi 5/14: EF 31%, small fixed inferior defect with no ischemia.  . Cholecystitis   . Chronic CHF ([place])    a. Mixed ICM/NICM (?EtOH). EF 35% in 2008. Echo 5/13: EF 60-65%, mod LVH, EF 45% on V gram in 12/2011. EF 12/2012: EF 50-55%, mild LVH, inferobasal HK, mild MR. ETT-Ses 5/14 EF 41%. Cardiac MRI 5/14: EF 44%, mild global HK, subepicardial delayed enhancement in nonspecific RV insertion pattern.  . COLONIC POLYPS, HX OF 12/30/2006  . Gout   . H/O atrial flutter 2007   a. Ablations in 2007, 2008.  [name] Heart murmur   . HEPATITIS B, CHRONIC 12/30/2006  . History of alcohol abuse   . History of hiatal hernia   . History of medication noncompliance   . History of nuclear stress test    Myoview 3/17: + chest pain; EF 33%, downsloping ST depression 2, V4-6, LVH with strain, no ischemia on images; intermediate risk   . HYPERCHOLESTEROLEMIA 07/11/2010  . Hypertension   . Left sciatic nerve pain since 04/2015  . LIVER FUNCTION TESTS, ABNORMAL, HX OF 12/30/2006  . MITRAL REGURGITATION 12/30/2006  . 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 PAF (paroxysmal atrial fibrillation) (HCC)    On coumadin  . Rectal bleeding 12/18/2011   Scheduled for colonoscopy.    . Shortness of breath dyspnea    with excertion  . Sleep apnea    "suppose to send mask but they never did" (05/03/2015)  . Type II  diabetes mellitus ([place])     Past Surgical History:  Procedure Laterality Date  . A FLUTTER ABLATION  2007, 2008   catheter ablation   . CARDIAC CATHETERIZATION N/A 05/03/2015   Procedure: Right/Left Heart Cath and Coronary Angiography;  Surgeon: Larey Dresser, MD;  Location: [place] [facility];  Service: Cardiovascular;  Laterality: N/A;  . CARDIAC CATHETERIZATION N/A 05/03/2015   Procedure: Coronary Stent Intervention;  Surgeon: Jettie Booze, MD;  Location: [place] [facility];  Service: Cardiovascular;  Laterality: N/A;  . CARDIAC CATHETERIZATION N/A 11/04/2015   Procedure: Left Heart Cath and Coronary Angiography;  Surgeon: Larey Dresser, MD;  Location: [place] [facility];  Service: Cardiovascular;  Laterality: N/A;  . CARDIOVERSION N/A 08/24/2015   Procedure: CARDIOVERSION;  Surgeon: Thayer Headings, MD;  Location: [facility];  Service: Cardiovascular;  Laterality: N/A;  . CHOLECYSTECTOMY N/A 02/07/2016   Procedure: LAPAROSCOPIC CHOLECYSTECTOMY WITH  INTRAOPERATIVE CHOLANGIOGRAM;  Surgeon: Greer Pickerel, MD;  Location: [facility];  Service: General;  Laterality: N/A;  . CORONARY ANGIOPLASTY  12/05/01  . LEFT HEART CATHETERIZATION WITH CORONARY ANGIOGRAM N/A 12/19/2011   Procedure: LEFT HEART CATHETERIZATION WITH CORONARY ANGIOGRAM;  Surgeon: Larey Dresser, MD;  Location: [facility] C[facility];  Service: Cardiovascular;  Laterality: N/A;  . OSTEOCHONDROMA EXCISION Left 1972   "took bone tumor off my shoulder"    Current Medications: Current Meds  Medication Sig  . acetaminophen (TYLENOL) 500 MG tablet Take 1,000 mg by mouth every 6 (six) hours as needed (pain). Reported on 08/18/2015  . amLODipine (NORVASC) 5 MG tablet Take 1 tablet (5 mg total) by mouth daily.  [name] aspirin EC 81 MG tablet Take 1 tablet (81 mg total) by mouth daily. DO NOT START UNTIL YOU FINISH PLAVIX  . carvedilol (COREG) 12.5 MG tablet Take 1.5 tablets (18.75 mg total) by mouth 2 (two) times daily with a meal.  . furosemide  (LASIX) 20 MG tablet Take 1 tablet (20 mg total) by mouth daily. Take 2 tablets in the morning and 1 in the evening.  . hydrALAZINE (APRESOLINE) 50 MG tablet Take 50 mg by mouth 2 (two) times daily.  . Insulin Glargine (LANTUS SOLOSTAR) 100 UNIT/ML Solostar Pen Inject 50 Units into the skin every morning. And pen needles 1/day  . metFORMIN (GLUCOPHAGE) 1000 MG tablet Take 1 tablet (1,000 mg total) by mouth 2 (two) times daily with a meal.  . nitroGLYCERIN (NITROSTAT) 0.4 MG SL tablet Place 1 tablet (0.4 mg total) under the tongue every 5 (five) minutes as needed for chest pain (x 3 tabs daily). Reported on 08/18/2015  . oxyCODONE (OXY IR/ROXICODONE) 5 MG immediate release tablet Take 1-2 tablets (5-10 mg total) by mouth every 4 (four) hours as needed for moderate pain.  . potassium chloride SA (K-DUR,KLOR-CON) 20 MEQ tablet Take 10 mEq by mouth every other day.  . spironolactone (ALDACTONE) 25 MG tablet Take 1 tablet (25 mg total) by mouth daily.  [name] warfarin (COUMADIN) 5 MG tablet TAKE AS DIRECTED BY COUMADIN CLINIC  . [DISCONTINUED] carvedilol (COREG) 12.5 MG tablet Take 1 tablet (12.5 mg total) by mouth 2 (two) times daily with a meal.     Allergies:   Ace inhibitors and Other   Social History   Social History  . Marital status: Widowed    Spouse name: N/A  . Number of children: 2  . Years of education: N/A   Occupational History  .  Unemployed   Social History Main Topics  . Smoking status: Former Smoker    Packs/day: 4.00    Years: 18.00    Types: Cigarettes    Quit date: 08/14/1983  . Smokeless tobacco: Never Used  . Alcohol use 9.6 oz/week    6 Cans of beer, 10 Shots of liquor per week     Comment: occasionally  . Drug use:     Types: Marijuana     Comment: occasionally  . Sexual activity: No   Other Topics Concern  . None   Social History Narrative  . None     Family History:  The patient's family history includes Diabetes in his mother; Hypertension in his mother.    ROS:   Please see the history of present illness.    Review of Systems  Cardiovascular: Positive for chest pain and dyspnea on exertion.  Respiratory: Positive for shortness of breath.   Gastrointestinal: Positive for nausea.  Neurological: Positive for headaches.   All other systems reviewed and are negative.  EKGs/Labs/Other Test Reviewed:    EKG:  EKG is  ordered today.  The ekg ordered today demonstrates Sinus tachycardia, HR 101, LVH with repolarization abnormality (downsloping ST-T waves 1, 2, 3, aVF, V6), QTc 466 ms, no change since prior tracings  Recent Labs: 10/12/2015: Brain Natriuretic Peptide 45.5 02/03/2016: ALT 14 06/12/2016: BUN 18; Creatinine, Ser 1.35; Hemoglobin 13.0; Platelets 301; Potassium 3.9; Sodium 137   Recent Lipid Panel    Component Value Date/Time   CHOL 104 (L) 07/12/2015 0920   TRIG 78 07/12/2015 0920   HDL 44 07/12/2015 0920   CHOLHDL 2.4 07/12/2015 0920   VLDL 16 07/12/2015 0920   LDLCALC 44 07/12/2015 0920   LDLDIRECT 111.0 04/22/2015 1559     Physical Exam:    VS:  BP (!) 160/94   Pulse (!) 101   Ht 5\' 11"  (1.803 m)   Wt 206 lb (93.4 kg)   BMI 28.73 kg/m     Wt Readings from Last 3 Encounters:  08/03/16 206 lb (93.4 kg)  06/12/16 213 lb 8 oz (96.8 kg)  05/08/16 213 lb (96.6 kg)     Physical Exam  Constitutional: He is oriented to person, place, and time. He appears well-developed and well-nourished. No distress.  HENT:  Head: Normocephalic and atraumatic.  Eyes: No scleral icterus.  Neck: No JVD present.  Cardiovascular: Normal rate, regular rhythm and normal heart sounds.   No murmur heard. Pulmonary/Chest: He has no wheezes. He has no rales.  Abdominal: Soft. There is no tenderness.  Musculoskeletal: He exhibits no edema.  Neurological: He is alert and oriented to person, place, and time.  Skin: Skin is warm and dry.  Psychiatric: He has a normal mood and affect.    ASSESSMENT:    1. Coronary artery disease  involving native coronary artery of native heart with unstable angina pectoris (HCC)   2. Persistent atrial fibrillation ([place])   3. Essential hypertension   4. DCM (dilated cardiomyopathy) ([place])   5. Chronic diastolic CHF (congestive heart failure) ([place])   6. CKD (chronic kidney disease) stage 2, GFR 60-89 ml/min   7. Pure hypercholesterolemia    PLAN:    In order of problems listed above:  1. CAD - s/p DES x 2 to the RCA in 9/16. He presents today with c/o chest pain since yesterday (>12 hours of symptoms). He has an abnormal ECG but this is not changed.  His cath in 3/17 showed stable anatomy.  He has missed some of his medications recently and he has not taken anything yet today. His HR is faster.  He is in NSR.  I have suggested admission to the hospital but he wants to avoid this.  Since his symptoms have been going on > 12 hours, I have offered drawing a stat Troponin.  He prefers this plan.  -  Draw stat Troponin. Will admit to the hospital if abnormal (he will wait in the office until resulted) - Continue statin, beta blocker, Amlodipine, ASA.  -  Increase Coreg to 18.75 mg bid  -  BMET, CBC, LFTs today.  2. PAF/Flutter - His HR is fast today but he is maintaining NSR.  He has a hx of acute CHF in the setting of AFlutter.  He underwent DCCV in 1/17. Continue Coumadin.  CHADS2-VASc=4.      3. Hypertension - BP uncontrolled.  He did miss some medications earlier this week.  Increase Coreg as noted.  Close FU next week.  4. Cardiomyopathy - Suspect  mixed ischemic/nonischemic (?ETOH related).  Cardiac MRI 2014 demonstrated EF 47%. Myoview in 3/17 with EF 33%. Follow-up echocardiogram in 6/17 demonstrated EF 55-60%.   5. Chronic Diastolic CHF - Volume appears stable.  I do not think his current symptoms are related to CHF.  6. CKD - Check BMET today.  7. HL - Continue statin.   ADDENDUM 1:32 PM - Troponin returned 0.04.  Given his prolonged symptoms, we have  recommended admission to the hospital for observation.  He was also seen by Dr. Irish Lack today ([facility]).  If his enzymes continue to rise, he will need cardiac cath.  If his enzymes remain flat or subsequent enzymes are normal, he can likely be DC to home tomorrow with continued medical Rx.  He will not likely need a stress test as his nuclear study earlier this year was abnormal and his cardiac cath demonstrated stable anatomy.    Medication Adjustments/Labs and Tests Ordered: Current medicines are reviewed at length with the patient today.  Concerns regarding medicines are outlined above.  Medication changes, Labs and Tests ordered today are outlined in the Patient Instructions noted below. Patient Instructions  Medication Instructions:  Your physician has recommended you make the following change in your medication:  1. Increase Metoprolol to one and one half tablet (18.75mg ) twice daily  Labwork: Your physician recommends that you have lab work STAT TROPHIN, [place].  Testing/Procedures: -None  Follow-Up: Your physician recommends that you keep your scheduled  follow-up appointment with Richardson Dopp, PA-C on Tuesday @ 8:45 am.  Any Other Special Instructions Will Be Listed Below (If Applicable).  If you need a refill on your cardiac medications before your next appointment, please call your pharmacy.  Signed, Richardson Dopp, PA-C  08/03/2016 1:32 PM    [facility] [address], [place], [place]  [address] Phone: [phone]; Fax: [phone]

## 2016-08-03 NOTE — H&P (Signed)
Cardiology Admission History and Physical:    Date:  08/03/2016   ID:  SUSAN HOOLE, DOB [birth year], MRN [id number]  PCP:  Angelica Chessman, MD  Cardiologist:Dr. Kirk Ruths McLean(CHF clinic) Electrophysiologist: N/a Gen Surgeon: Dr. Redmond Pulling  Referring MD: Tresa Garter, MD   Chief Complaint  Patient presents with  . Chest Pain    History of Present Illness:    [name] [name] is a 60 y.o. male with a hx of HTN, DM, PAF, CAD, CKD. LHC in 5/13 demonstrated severe distal and branch vessel disease and he was treated with med Rx.  Echo in 5/13 demonstrated normal LVF. Cardiac MRI in 2014 demonstrated EF 44%. In 10/2013 he had an episode of AFib with RVR. Echo 2/16 demonstrated EF 55-60% and moderate diastolic dysfunction. Myoview in 9/16 demonstrated inferior ischemia. He underwent right and left-sided heart catheterization. Filling pressures were not significantly elevated. He did have severe distal RCA stenosis that was treated with DES 2. Repeat echo 9/16 demonstrated EF 55-60% and moderate diastolic dysfunction. He was admitted in 10/16 with acute cholecystitis s/p cholecystotomy tube.  Cholecystectomy was delayed due to the need for antiplatelet Rx after PCI with DES.  He was admitted in 0000000 with a/c diastolic CHF in the setting of AFlutter and underwent DCCV.  In 3/17 a stress test showed significant ST changes (no ischemia) and a LHC demonstrated extensive, diffuse CAD c/w poorly controlled diabetes and patent RCA stents.  Med Rx was continued.  He eventually underwent lap cholecystectomy in [id number] without complications.  Last seen here in 9/17 by me.  He also followed up with Dr. Loralie Champagne in 10/17 in the HF Clinic.    He returns to the office today for routine cardiology follow-up. He tells me that yesterday he went to work for the first time in a long time. He lays bricks. Later in the shift, he started getting cramping in his hands and chest discomfort described as  tightness. His discomfort has lasted since that time (4 PM yesterday). He notes associated dyspnea, nausea and diaphoresis. He vomited twice. He feels that his symptoms are improving. He does not feel like these are his symptoms of angina. He denies syncope. Denies orthopnea, PND or edema. He did not take nitroglycerin. He does admit to missing several medications for a couple of days earlier this week.  Prior CV studies that were reviewed today include:    Echo 01/13/16 Severe focal basal and mod conc LVH, EF 55-60%, no RWMA, Gr 2 DD, mild AI, mild MR, mild LAE  LHC 11/04/15 LM 20% distal  LAD 40% ostial, 40% proximal, Diffuse moderate distal vessel disease.  RI  50% ostial/proximal, 60-70% mid, Diffuse distal and branch vessel disease.  LCx - AV LCx was relatively small with diffuse mild-moderate disease.  RCA -  Patent ostial and distal RCA stents, 40% mid, Diffuse mild to moderate distal disease. Extensive CAD but no severe/critical lesions. He has diffuse disease consistent with his poorly controlled diabetes. Stents in the RCA are patent. Continue medical management.   Myoview 11/01/15 + chest pain; EF 33%, downsloping ST depression 2, V4-6, LVH with strain, no ischemia on images; intermediate risk   R/L [facility] 05/03/15 PCI: 2.5 x 16 Synergy DES to the distal RCA, 3 x 12 Synergy DES to the proximal RCA  Myoview 9/16 Partially reversible (SDS 4) moderate-sized and intensity inferior perfusion defect, which suggests ischemia, however patient motion noted during the study and mis-registration artifact is possible. Dilated LV with  severe hypokinesis - LVEF 26%. This is a high risk study.  Echo 9/16 Mild LVH, EF 55-60%, normal wall motion, grade 2 diastolic dysfunction, trivial AI, mild MR, moderate LAE, normal RVSF, mild TR  LHC (5/13):  LHC (5/13) with diffuse distal and branch vessel disease, mild global hypokinesis and EF 45%  Echo (5/14):  inferbasal HK, mild LVH, EF 50-55%,  trivial AI, mild MR, mild LAE  Nuclear (5/14):  Inf defect c/w thinning vs infarct, no ischemia, EF 31%; High Risk  cMRI (5/14):  Mild LVH, EF 44%, normal RV size and function, small areas of subepicardial delayed enhancement of the mid anteroseptal and mid inferoseptal RV insertion sites-nonspecific finding  Past Medical History:  Diagnosis Date  . Anginal pain ([place])   . Anxiety   . Arrhythmia   . CAD (coronary artery disease), native coronary artery    a. Nonobstructive CAD by cath 2013 - diffuse distal and branch vessel CAD, no severe disease in the major coronaries, LV mild global hypokinesis, EF 45%. b. ETT-Sestamibi 5/14: EF 31%, small fixed inferior defect with no ischemia.  . Cholecystitis   . Chronic CHF ([place])    a. Mixed ICM/NICM (?EtOH). EF 35% in 2008. Echo 5/13: EF 60-65%, mod LVH, EF 45% on V gram in 12/2011. EF 12/2012: EF 50-55%, mild LVH, inferobasal HK, mild MR. ETT-Ses 5/14 EF 41%. Cardiac MRI 5/14: EF 44%, mild global HK, subepicardial delayed enhancement in nonspecific RV insertion pattern.  . COLONIC POLYPS, HX OF 12/30/2006  . Gout   . H/O atrial flutter 2007   a. Ablations in 2007, 2008.  [name] Heart murmur   . HEPATITIS B, CHRONIC 12/30/2006  . History of alcohol abuse   . History of hiatal hernia   . History of medication noncompliance   . History of nuclear stress test    Myoview 3/17: + chest pain; EF 33%, downsloping ST depression 2, V4-6, LVH with strain, no ischemia on images; intermediate risk   . HYPERCHOLESTEROLEMIA 07/11/2010  . Hypertension   . Left sciatic nerve pain since 04/2015  . LIVER FUNCTION TESTS, ABNORMAL, HX OF 12/30/2006  . MITRAL REGURGITATION 12/30/2006  . 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 PAF (paroxysmal atrial fibrillation) (HCC)    On coumadin  . Rectal bleeding 12/18/2011   Scheduled for colonoscopy.    . Shortness of breath dyspnea    with excertion  . Sleep apnea    "suppose to send mask but they never did"  (05/03/2015)  . Type II diabetes mellitus ([place])     Past Surgical History:  Procedure Laterality Date  . A FLUTTER ABLATION  2007, 2008   catheter ablation   . CARDIAC CATHETERIZATION N/A 05/03/2015   Procedure: Right/Left Heart Cath and Coronary Angiography;  Surgeon: Larey Dresser, MD;  Location: [place] CV LAB;  Service: Cardiovascular;  Laterality: N/A;  . CARDIAC CATHETERIZATION N/A 05/03/2015   Procedure: Coronary Stent Intervention;  Surgeon: Jettie Booze, MD;  Location: [place] [facility];  Service: Cardiovascular;  Laterality: N/A;  . CARDIAC CATHETERIZATION N/A 11/04/2015   Procedure: Left Heart Cath and Coronary Angiography;  Surgeon: Larey Dresser, MD;  Location: [place] [facility];  Service: Cardiovascular;  Laterality: N/A;  . CARDIOVERSION N/A 08/24/2015   Procedure: CARDIOVERSION;  Surgeon: Thayer Headings, MD;  Location: [facility] ENDOSCOPY;  Service: Cardiovascular;  Laterality: N/A;  . CHOLECYSTECTOMY N/A 02/07/2016   Procedure: LAPAROSCOPIC CHOLECYSTECTOMY WITH  INTRAOPERATIVE CHOLANGIOGRAM;  Surgeon: Greer Pickerel,  MD;  Location: [facility] ORS;  Service: General;  Laterality: N/A;  . CORONARY ANGIOPLASTY  12/05/01  . LEFT HEART CATHETERIZATION WITH CORONARY ANGIOGRAM N/A 12/19/2011   Procedure: LEFT HEART CATHETERIZATION WITH CORONARY ANGIOGRAM;  Surgeon: Larey Dresser, MD;  [facility] CATH LAB;  Service: Cardiovascular;  Laterality: N/A;  . OSTEOCHONDROMA EXCISION Left 1972   "took bone tumor off my shoulder"    Current Medications: Current Meds  Medication Sig  . acetaminophen (TYLENOL) 500 MG tablet Take 1,000 mg by mouth every 6 (six) hours as needed (pain). Reported on 08/18/2015  . amLODipine (NORVASC) 5 MG tablet Take 1 tablet (5 mg total) by mouth daily.  [name] aspirin EC 81 MG tablet Take 1 tablet (81 mg total) by mouth daily. DO NOT START UNTIL YOU FINISH PLAVIX  . carvedilol (COREG) 12.5 MG tablet Take 1.5 tablets (18.75 mg total) by mouth 2 (two) times daily with a  meal.  . furosemide (LASIX) 20 MG tablet Take 1 tablet (20 mg total) by mouth daily. Take 2 tablets in the morning and 1 in the evening.  . hydrALAZINE (APRESOLINE) 50 MG tablet Take 50 mg by mouth 2 (two) times daily.  . Insulin Glargine (LANTUS SOLOSTAR) 100 UNIT/ML Solostar Pen Inject 50 Units into the skin every morning. And pen needles 1/day  . metFORMIN (GLUCOPHAGE) 1000 MG tablet Take 1 tablet (1,000 mg total) by mouth 2 (two) times daily with a meal.  . nitroGLYCERIN (NITROSTAT) 0.4 MG SL tablet Place 1 tablet (0.4 mg total) under the tongue every 5 (five) minutes as needed for chest pain (x 3 tabs daily). Reported on 08/18/2015  . oxyCODONE (OXY IR/ROXICODONE) 5 MG immediate release tablet Take 1-2 tablets (5-10 mg total) by mouth every 4 (four) hours as needed for moderate pain.  . potassium chloride SA (K-DUR,KLOR-CON) 20 MEQ tablet Take 10 mEq by mouth every other day.  . spironolactone (ALDACTONE) 25 MG tablet Take 1 tablet (25 mg total) by mouth daily.  [name] warfarin (COUMADIN) 5 MG tablet TAKE AS DIRECTED BY COUMADIN CLINIC  . [DISCONTINUED] carvedilol (COREG) 12.5 MG tablet Take 1 tablet (12.5 mg total) by mouth 2 (two) times daily with a meal.     Allergies:   Ace inhibitors and Other   Social History   Social History  . Marital status: Widowed    Spouse name: N/A  . Number of children: 2  . Years of education: N/A   Occupational History  .  Unemployed   Social History Main Topics  . Smoking status: Former Smoker    Packs/day: 4.00    Years: 18.00    Types: Cigarettes    Quit date: 08/14/1983  . Smokeless tobacco: Never Used  . Alcohol use 9.6 oz/week    6 Cans of beer, 10 Shots of liquor per week     Comment: occasionally  . Drug use:     Types: Marijuana     Comment: occasionally  . Sexual activity: No   Other Topics Concern  . None   Social History Narrative  . None     Family History:  The patient's family history includes Diabetes in his mother;  Hypertension in his mother.   ROS:   Please see the history of present illness.    Review of Systems  Cardiovascular: Positive for chest pain and dyspnea on exertion.  Respiratory: Positive for shortness of breath.   Gastrointestinal: Positive for nausea.  Neurological: Positive for headaches.   All other systems  reviewed and are negative.   EKGs/Labs/Other Test Reviewed:    EKG:  EKG is  ordered today.  The ekg ordered today demonstrates Sinus tachycardia, HR 101, LVH with repolarization abnormality (downsloping ST-T waves 1, 2, 3, aVF, V6), QTc 466 ms, no change since prior tracings  Recent Labs: 10/12/2015: Brain Natriuretic Peptide 45.5 02/03/2016: ALT 14 06/12/2016: BUN 18; Creatinine, Ser 1.35; Hemoglobin 13.0; Platelets 301; Potassium 3.9; Sodium 137   Recent Lipid Panel    Component Value Date/Time   CHOL 104 (L) 07/12/2015 0920   TRIG 78 07/12/2015 0920   HDL 44 07/12/2015 0920   CHOLHDL 2.4 07/12/2015 0920   VLDL 16 07/12/2015 0920   LDLCALC 44 07/12/2015 0920   LDLDIRECT 111.0 04/22/2015 1559     Physical Exam:    VS:  BP (!) 160/94   Pulse (!) 101   Ht 5\' 11"  (1.803 m)   Wt 206 lb (93.4 kg)   BMI 28.73 kg/m     Wt Readings from Last 3 Encounters:  08/03/16 206 lb (93.4 kg)  06/12/16 213 lb 8 oz (96.8 kg)  05/08/16 213 lb (96.6 kg)     Physical Exam  Constitutional: He is oriented to person, place, and time. He appears well-developed and well-nourished. No distress.  HENT:  Head: Normocephalic and atraumatic.  Eyes: No scleral icterus.  Neck: No JVD present.  Cardiovascular: Normal rate, regular rhythm and normal heart sounds.   No murmur heard. Pulmonary/Chest: He has no wheezes. He has no rales.  Abdominal: Soft. There is no tenderness.  Musculoskeletal: He exhibits no edema.  Neurological: He is alert and oriented to person, place, and time.  Skin: Skin is warm and dry.  Psychiatric: He has a normal mood and affect.    ASSESSMENT:    1.  Coronary artery disease involving native coronary artery of native heart with angina pectoris (HCC)   2. Persistent atrial fibrillation ([place])   3. Essential hypertension   4. DCM (dilated cardiomyopathy) ([place])   5. Chronic diastolic CHF (congestive heart failure) ([place])   6. CKD (chronic kidney disease) stage 2, GFR 60-89 ml/min   7. Pure hypercholesterolemia    PLAN:    In order of problems listed above:  1. CAD - s/p DES x 2 to the RCA in 9/16. He presents today with c/o chest pain since yesterday (>12 hours of symptoms). He has an abnormal ECG but this is not changed.  His cath in 3/17 showed stable anatomy.  He has missed some of his medications recently and he has not taken anything yet today. His HR is faster.  He is in NSR.  I have suggested admission to the hospital but he wants to avoid this.  Since his symptoms have been going on > 12 hours, I have offered drawing a stat Troponin.  He prefers this plan.  -  Draw stat Troponin. Will admit to the hospital if abnormal (he will wait in the office until resulted) - Continue statin, beta blocker, Amlodipine, ASA.  -  Increase Coreg to 18.75 mg bid  -  BMET, CBC, LFTs today.  2. PAF/Flutter - His HR is fast today but he is maintaining NSR.  He has a hx of acute CHF in the setting of AFlutter.  He underwent DCCV in 1/17. Continue Coumadin.  CHADS2-VASc=4.      3. Hypertension - BP uncontrolled.  He did miss some medications earlier this week.  Increase Coreg as noted.  Close FU next week.  4. Cardiomyopathy - Suspect mixed ischemic/nonischemic (?ETOH related).  Cardiac MRI 2014 demonstrated EF 47%. Myoview in 3/17 with EF 33%. Follow-up echocardiogram in 6/17 demonstrated EF 55-60%.   5. Chronic Diastolic CHF - Volume appears stable.  I do not think his current symptoms are related to CHF.  6. CKD - Check BMET today.  7. HL - Resume statin.   ADDENDUM 1:12 PM - Troponin returned 0.04.  Given his prolonged symptoms,  we have recommended admission to the hospital for observation.  He was also seen by Dr. Irish Lack today (DOD).  If his enzymes continue to rise, he will need cardiac cath.  If his enzymes remain flat or subsequent enzymes are normal, he can likely be DC to home tomorrow with continued medical Rx.  He will not likely need a stress test as his nuclear study earlier this year was abnormal and his cardiac cath demonstrated stable anatomy.     Signed, Richardson Dopp, PA-C  08/03/2016 1:12 PM    [facility] [address], [place], [place]  [address] Phone: [phone]; Fax: [phone]    I have examined the patient and reviewed assessment and plan and discussed with patient.  Agree with above as stated.  Troponin minimally elevated.  Unclear significance.  Watch overnight.  If troponin trend is flat and he is symptom free, can probably go home tomorrow.  If there is a significant rise in troponin, then may ned further w/u.  Most recent cath was stable so this is reassuring.  Larae Grooms

## 2016-08-04 DIAGNOSIS — I509 Heart failure, unspecified: Secondary | ICD-10-CM | POA: Diagnosis not present

## 2016-08-04 DIAGNOSIS — Z7982 Long term (current) use of aspirin: Secondary | ICD-10-CM | POA: Diagnosis not present

## 2016-08-04 DIAGNOSIS — Z9119 Patient's noncompliance with other medical treatment and regimen: Secondary | ICD-10-CM

## 2016-08-04 DIAGNOSIS — I25110 Atherosclerotic heart disease of native coronary artery with unstable angina pectoris: Secondary | ICD-10-CM | POA: Diagnosis not present

## 2016-08-04 DIAGNOSIS — Z7901 Long term (current) use of anticoagulants: Secondary | ICD-10-CM | POA: Diagnosis not present

## 2016-08-04 DIAGNOSIS — I10 Essential (primary) hypertension: Secondary | ICD-10-CM | POA: Diagnosis not present

## 2016-08-04 DIAGNOSIS — I110 Hypertensive heart disease with heart failure: Secondary | ICD-10-CM | POA: Diagnosis not present

## 2016-08-04 DIAGNOSIS — I481 Persistent atrial fibrillation: Secondary | ICD-10-CM | POA: Diagnosis not present

## 2016-08-04 DIAGNOSIS — Z87891 Personal history of nicotine dependence: Secondary | ICD-10-CM | POA: Diagnosis not present

## 2016-08-04 DIAGNOSIS — E119 Type 2 diabetes mellitus without complications: Secondary | ICD-10-CM | POA: Diagnosis not present

## 2016-08-04 DIAGNOSIS — Z7984 Long term (current) use of oral hypoglycemic drugs: Secondary | ICD-10-CM | POA: Diagnosis not present

## 2016-08-04 LAB — GLUCOSE, CAPILLARY
GLUCOSE-CAPILLARY: 268 mg/dL — AB (ref 65–99)
GLUCOSE-CAPILLARY: 149 mg/dL — AB (ref 65–99)
Glucose-Capillary: 364 mg/dL — ABNORMAL HIGH (ref 65–99)
Glucose-Capillary: 147 mg/dL — ABNORMAL HIGH (ref 65–99)

## 2016-08-04 LAB — MAGNESIUM: Magnesium: 1.8 mg/dL (ref 1.7–2.4)

## 2016-08-04 LAB — TROPONIN I: Troponin I: 0.03 ng/mL (ref <0.03)

## 2016-08-04 LAB — PROTIME-INR
INR: 2.9
Prothrombin Time: 31 seconds — ABNORMAL HIGH (ref 11.4–15.2)

## 2016-08-04 MED ORDER — SOTALOL HCL 120 MG PO TABS
120.0000 mg | ORAL_TABLET | Freq: Two times a day (BID) | ORAL | Status: DC
  Administered 2016-08-04 – 2016-08-05 (×3): 120 mg via ORAL
  Filled 2016-08-04 (×3): qty 1

## 2016-08-04 MED ORDER — ACETAMINOPHEN 325 MG PO TABS
650.0000 mg | ORAL_TABLET | Freq: Three times a day (TID) | ORAL | Status: DC | PRN
  Administered 2016-08-04: 650 mg via ORAL
  Filled 2016-08-04: qty 2

## 2016-08-04 NOTE — Progress Notes (Signed)
SUBJECTIVE: The patient is doing well today.  CP is resolved.  In afib.  + mild SOB.     [name] amLODipine  5 mg Oral Daily  . aspirin EC  81 mg Oral Daily  . atorvastatin  80 mg Oral q1800  . carvedilol  18.75 mg Oral BID WC  . furosemide  20 mg Oral QPM  . furosemide  40 mg Oral BH-q7a  . hydrALAZINE  50 mg Oral BID  . insulin aspart  0-25 Units Subcutaneous TID WC  . insulin aspart  0-5 Units Subcutaneous QHS  . insulin glargine  30 Units Subcutaneous Q breakfast  . insulin glargine  50 Units Subcutaneous BH-q7a  . metFORMIN  1,000 mg Oral BID WC  . sodium chloride flush  3 mL Intravenous Q12H  . spironolactone  25 mg Oral Daily  . warfarin  2.5 mg Oral Once per day on Tue Thu Sat  . warfarin  5 mg Oral Once per day on Sun Mon Wed Fri  . Warfarin - Pharmacist Dosing Inpatient   Does not apply q1800     OBJECTIVE: Physical Exam: Vitals:   08/03/16 1400 08/03/16 2100 08/04/16 0400 08/04/16 0930  BP: 137/71 140/83 (!) 152/83 (!) 137/98  Pulse:  (!) 102 (!) 102   Resp:  19 18 17   Temp:  99.3 F (37.4 C) 98.4 F (36.9 C)   TempSrc:      SpO2:  98% 95%   Weight:   200 lb 11.2 oz (91 kg)   Height:        Intake/Output Summary (Last 24 hours) at 08/04/16 1101 Last data filed at 08/04/16 1000  Gross per 24 hour  Intake             1316 ml  Output                0 ml  Net             1316 ml    Telemetry reveals sinus rhythm has converted to afib with RVR.   V rates 120s  GEN- The patient is well appearing, alert and oriented x 3 today.   Head- normocephalic, atraumatic Eyes-  Sclera clear, conjunctiva pink Ears- hearing intact Oropharynx- clear Neck- supple,   Lungs- Clear to ausculation bilaterally, normal work of breathing Heart- tachycardic irregular rhythm GI- soft, NT, ND, + BS Extremities- no clubbing, cyanosis, or edema Skin- no rash or lesion Psych- euthymic mood, full affect Neuro- strength and sensation are intact  LABS: Basic Metabolic  Panel:  Recent Labs  08/03/16 1105  NA 132*  K 4.0  CL 97*  CO2 25  GLUCOSE 429*  BUN 16  CREATININE 1.20  CALCIUM 9.2   Liver Function Tests:  Recent Labs  08/03/16 1105  AST 17  ALT 14  ALKPHOS 75  BILITOT 0.7  PROT 7.7  ALBUMIN 3.8   No results for input(s): LIPASE, AMYLASE in the last 72 hours. CBC:  Recent Labs  08/03/16 1105  WBC 6.9  NEUTROABS 4,347  HGB 13.5  HCT 42.8  MCV 79.9*  PLT 216   Cardiac Enzymes:  Recent Labs  08/03/16 1503 08/03/16 2032 08/04/16 0230  TROPONINI 0.03* 0.04* 0.03*   ASSESSMENT AND PLAN:  Principal Problem:   Coronary artery disease involving native coronary artery with unstable angina pectoris (HCC) Active Problems:   HYPERCHOLESTEROLEMIA   Persistent atrial fibrillation (HCC)   Chronic diastolic CHF (congestive heart failure) ([place])   Essential  hypertension   Unstable angina (HCC)  1. Atrial fibrillation with RVR chads2vasc score is at least 3.  He is on coumadin V rates are elevated. Will add sotalol 120mg  BID.  Risks of medicine discussed with patient and daughter who wish to proceed Will follow K and Mg and ekg  2. Atypical chest pain TNs without significant elevated No ekg changes Medical management planned  3. htn Stable No change required today  4. Diabetes He says he cannot afford and is not taking insulin at home  5. OSA Compliance with CPAP encouraged  6. ETOH  Cessation advised He drinks heavily and this is likely part of the issue  Hope to discharge in 24-48 hours Importance of compliance discussed with patient I have informed him of risks related to ETOH, noncompliance and poorly controlle diabetes.  I worry that his prognosis is very poor and have communicated this with him today.  Will consult case management to see if they can offer him assistance.   Thompson Grayer, MD 08/04/2016 11:01 AM

## 2016-08-04 NOTE — Progress Notes (Signed)
Rcvd call from [place], pt has converted to sinus rhythm. Paged on call cardiology Eula Fried

## 2016-08-04 NOTE — Progress Notes (Signed)
ANTICOAGULATION CONSULT NOTE - Initial Consult  Pharmacy Consult for Coumadin Indication: atrial fibrillation  Allergies  Allergen Reactions  . Ace Inhibitors Cough  . Other Hives and Other (See Comments)    Patient reports developing hives after receiving "some antibiotic given in 1980''s at [facility]". He does not know which antibiotic.    Patient Measurements: Height: 5\' 11"  (180.3 cm) Weight: 200 lb 11.2 oz (91 kg) IBW/kg (Calculated) : 75.3  Vital Signs: Temp: 98.4 F (36.9 C) (12/23 0400) BP: 137/98 (12/23 0930) Pulse Rate: 102 (12/23 0400)  Labs:  Recent Labs  08/03/16 1009  08/03/16 1105 08/03/16 1503 08/03/16 2032 08/04/16 0230  HGB  --   --  13.5  --   --   --   HCT  --   --  42.8  --   --   --   PLT  --   --  216  --   --   --   LABPROT  --   --   --   --   --  31.0*  INR 2.5  --   --   --   --  2.90  CREATININE  --   --  1.20  --   --   --   TROPONINI  --   < > 0.04* 0.03* 0.04* 0.03*  < > = values in this interval not displayed.  Estimated Creatinine Clearance: 73.7 mL/min (by C-G formula based on SCr of 1.2 mg/dL).   Medical History: Past Medical History:  Diagnosis Date  . Anginal pain ([place])   . Anxiety   . Arrhythmia   . CAD (coronary artery disease), native coronary artery    a. Nonobstructive CAD by cath 2013 - diffuse distal and branch vessel CAD, no severe disease in the major coronaries, LV mild global hypokinesis, EF 45%. b. ETT-Sestamibi 5/14: EF 31%, small fixed inferior defect with no ischemia.  . Cholecystitis   . Chronic CHF ([place])    a. Mixed ICM/NICM (?EtOH). EF 35% in 2008. Echo 5/13: EF 60-65%, mod LVH, EF 45% on V gram in 12/2011. EF 12/2012: EF 50-55%, mild LVH, inferobasal HK, mild MR. ETT-Ses 5/14 EF 41%. Cardiac MRI 5/14: EF 44%, mild global HK, subepicardial delayed enhancement in nonspecific RV insertion pattern.  . COLONIC POLYPS, HX OF 12/30/2006  . Gout   . H/O atrial flutter 2007   a. Ablations in 2007, 2008.   [name] Heart murmur   . HEPATITIS B, CHRONIC 12/30/2006  . History of alcohol abuse   . History of hiatal hernia   . History of medication noncompliance   . History of nuclear stress test    Myoview 3/17: + chest pain; EF 33%, downsloping ST depression 2, V4-6, LVH with strain, no ischemia on images; intermediate risk   . HYPERCHOLESTEROLEMIA 07/11/2010  . Hypertension   . Left sciatic nerve pain since 04/2015  . LIVER FUNCTION TESTS, ABNORMAL, HX OF 12/30/2006  . MITRAL REGURGITATION 12/30/2006  . 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 PAF (paroxysmal atrial fibrillation) (HCC)    On coumadin  . Rectal bleeding 12/18/2011   Scheduled for colonoscopy.    . Shortness of breath dyspnea    with excertion  . Sleep apnea    "suppose to send mask but they never did" (05/03/2015)  . Type II diabetes mellitus [facility])    Assessment:   60 yr old male admitted with chest pain, takes Coumadin PTA for afib.  INR therapeutic (2.9) on home regimen of 5 mg MWFSun and 2.5 mg TThSat.   Hgb and platelets normal. No s/s bleeding noted.   Goal of Therapy:  INR 2-3 Monitor platelets by anticoagulation protocol: Yes   Plan:   Continue home Coumadin regimen: 5 mg MWFSun and 2.5 mg TTSat.  Daily PT/INR, CBC at least q72h  Monitor for s/s bleeding   Argie Ramming, PharmD Pharmacy Resident  Pager [phone] 08/04/16 11:32 AM

## 2016-08-04 NOTE — Progress Notes (Addendum)
Inpatient Diabetes Program Recommendations  AACE/ADA: New Consensus Statement on Inpatient Glycemic Control (2015)  Target Ranges:  Prepandial:   less than 140 mg/dL      Peak postprandial:   less than 180 mg/dL (1-2 hours)      Critically ill patients:  140 - 180 mg/dL   Lab Results  Component Value Date   GLUCAP 125 (H) 08/03/2016   HGBA1C 7.6 01/26/2016    Review of Glycemic ControlResults for DIAMANTE, DEBENEDICTIS (MRN [id number]) as of 08/04/2016 08:16  Ref. Range 08/03/2016 14:04 08/03/2016 16:29 08/03/2016 18:49 08/03/2016 21:11  Glucose-Capillary Latest Ref Range: 65 - 99 mg/dL 372 (H) 428 (H) 261 (H) 125 (H)    Diabetes history: Type 2 diabetes Outpatient Diabetes medications: Lantus solostar 50 units q AM-prn, Metformin 1000 mg bid Current orders for Inpatient glycemic control:  Lantus 50 units q AM and Lantus 30 units q AM, Novolog 0-25 units tid with meals and HS scale Inpatient Diabetes Program Recommendations:    Called and spoke with RN.  She noted duplicate orders of Lantus. Then called and spoke with patient.   Patient states that he has not  been taking the full dose of Lantus to try and stretch dose. He has only been taking Lantus 50 units one a week.  He is concerned with taking the full dose of Lantus daily, with concern that he might go low.  Agree with reduced dose of Lantus 30 units at this time.  He has been seen at the [facility] and wellness center by Dr. Doreene Burke.  Encouraged him to make appointment there ASAP after discharge.  He also may be a candidate for prescription assistance there at the pharmacy.  Discussed potential of cheaper insulin such as NPH that needs to be given 2 times a day and he states "I'll stay on the Lantus". Also instructed him to check blood sugar fasting (Goal 100-120) to determine how Lantus is working.  He states he will get new insurance January 1, which will help him with his Lantus price.  Instructed him on meter from [place],  although since he has Medicare it should cover meter/strips since he is on insulin-MD please prescribe at d/c.  Please also consider ordering A1C to determine pre-hospitalization glycemic control.  Thanks, Adah Perl, RN, BC-ADM Inpatient Diabetes Coordinator Pager [phone] (8a-5p)

## 2016-08-05 DIAGNOSIS — I509 Heart failure, unspecified: Secondary | ICD-10-CM | POA: Diagnosis not present

## 2016-08-05 DIAGNOSIS — I481 Persistent atrial fibrillation: Secondary | ICD-10-CM | POA: Diagnosis not present

## 2016-08-05 DIAGNOSIS — I25110 Atherosclerotic heart disease of native coronary artery with unstable angina pectoris: Secondary | ICD-10-CM | POA: Diagnosis not present

## 2016-08-05 DIAGNOSIS — Z7984 Long term (current) use of oral hypoglycemic drugs: Secondary | ICD-10-CM | POA: Diagnosis not present

## 2016-08-05 DIAGNOSIS — E119 Type 2 diabetes mellitus without complications: Secondary | ICD-10-CM | POA: Diagnosis not present

## 2016-08-05 DIAGNOSIS — I10 Essential (primary) hypertension: Secondary | ICD-10-CM | POA: Diagnosis not present

## 2016-08-05 DIAGNOSIS — Z7982 Long term (current) use of aspirin: Secondary | ICD-10-CM | POA: Diagnosis not present

## 2016-08-05 DIAGNOSIS — Z7901 Long term (current) use of anticoagulants: Secondary | ICD-10-CM | POA: Diagnosis not present

## 2016-08-05 DIAGNOSIS — I110 Hypertensive heart disease with heart failure: Secondary | ICD-10-CM | POA: Diagnosis not present

## 2016-08-05 DIAGNOSIS — Z87891 Personal history of nicotine dependence: Secondary | ICD-10-CM | POA: Diagnosis not present

## 2016-08-05 LAB — BASIC METABOLIC PANEL
ANION GAP: 10 (ref 5–15)
BUN: 26 mg/dL — AB (ref 6–20)
CHLORIDE: 100 mmol/L — AB (ref 101–111)
CO2: 23 mmol/L (ref 22–32)
Calcium: 9.5 mg/dL (ref 8.9–10.3)
Creatinine, Ser: 1.27 mg/dL — ABNORMAL HIGH (ref 0.61–1.24)
GFR calc Af Amer: >60 mL/min (ref >60)
GFR, EST NON AFRICAN AMERICAN: 59 mL/min — AB (ref >60)
Glucose, Bld: 252 mg/dL — ABNORMAL HIGH (ref 65–99)
POTASSIUM: 4 mmol/L (ref 3.5–5.1)
SODIUM: 133 mmol/L — AB (ref 135–145)

## 2016-08-05 LAB — PROTIME-INR
INR: 3.1
Prothrombin Time: 32.7 seconds — ABNORMAL HIGH (ref 11.4–15.2)

## 2016-08-05 LAB — GLUCOSE, CAPILLARY: GLUCOSE-CAPILLARY: 236 mg/dL — AB (ref 65–99)

## 2016-08-05 LAB — MAGNESIUM: MAGNESIUM: 1.9 mg/dL (ref 1.7–2.4)

## 2016-08-05 MED ORDER — INSULIN GLARGINE 100 UNIT/ML SOLOSTAR PEN: 30.0000 [IU] | PEN_INJECTOR | SUBCUTANEOUS | 3 refills | Status: DC

## 2016-08-05 MED ORDER — SOTALOL HCL 120 MG PO TABS: 120.0000 mg | ORAL_TABLET | Freq: Two times a day (BID) | ORAL | 5 refills | Status: DC

## 2016-08-05 NOTE — Progress Notes (Signed)
SUBJECTIVE: The patient is doing well today.  CP is resolved.  In afib. SOB is improved.   [name] amLODipine  5 mg Oral Daily  . aspirin EC  81 mg Oral Daily  . atorvastatin  80 mg Oral q1800  . carvedilol  18.75 mg Oral BID WC  . furosemide  20 mg Oral QPM  . furosemide  40 mg Oral BH-q7a  . hydrALAZINE  50 mg Oral BID  . insulin aspart  0-25 Units Subcutaneous TID WC  . insulin aspart  0-5 Units Subcutaneous QHS  . insulin glargine  30 Units Subcutaneous Q breakfast  . metFORMIN  1,000 mg Oral BID WC  . sodium chloride flush  3 mL Intravenous Q12H  . sotalol  120 mg Oral Q12H  . spironolactone  25 mg Oral Daily  . warfarin  2.5 mg Oral Once per day on Tue Thu Sat  . warfarin  5 mg Oral Once per day on Sun Mon Wed Fri  . Warfarin - Pharmacist Dosing Inpatient   Does not apply q1800     OBJECTIVE: Physical Exam: Vitals:   08/04/16 2041 08/04/16 2200 08/05/16 0500 08/05/16 0812  BP: 109/78  120/77 (!) 144/81  Pulse: 77  83   Resp: 19 (!) 23 18 18   Temp: 98.9 F (37.2 C)  98.1 F (36.7 C)   TempSrc:      SpO2: 98%  99%   Weight:      Height:        Intake/Output Summary (Last 24 hours) at 08/05/16 0917 Last data filed at 08/05/16 0800  Gross per 24 hour  Intake             1418 ml  Output              400 ml  Net             1018 ml    Telemetry reveals sinus rhythm has converted to afib with RVR.   V rates 120s  GEN- The patient is well appearing, alert and oriented x 3 today.   Head- normocephalic, atraumatic Eyes-  Sclera clear, conjunctiva pink Ears- hearing intact Oropharynx- clear Neck- supple,   Lungs- Clear to ausculation bilaterally, normal work of breathing Heart- tachycardic irregular rhythm GI- soft, NT, ND, + BS Extremities- no clubbing, cyanosis, or edema Skin- no rash or lesion Psych- euthymic mood, full affect Neuro- strength and sensation are intact  LABS: Basic Metabolic Panel:  Recent Labs  08/03/16 1105 08/04/16 1208 08/05/16 0327   NA 132*  --  133*  K 4.0  --  4.0  CL 97*  --  100*  CO2 25  --  23  GLUCOSE 429*  --  252*  BUN 16  --  26*  CREATININE 1.20  --  1.27*  CALCIUM 9.2  --  9.5  MG  --  1.8 1.9   Liver Function Tests:  Recent Labs  08/03/16 1105  AST 17  ALT 14  ALKPHOS 75  BILITOT 0.7  PROT 7.7  ALBUMIN 3.8   No results for input(s): LIPASE, AMYLASE in the last 72 hours. CBC:  Recent Labs  08/03/16 1105  WBC 6.9  NEUTROABS 4,347  HGB 13.5  HCT 42.8  MCV 79.9*  PLT 216   Cardiac Enzymes:  Recent Labs  08/03/16 1503 08/03/16 2032 08/04/16 0230  TROPONINI 0.03* 0.04* 0.03*   ASSESSMENT AND PLAN:  Principal Problem:   Coronary artery disease involving native  coronary artery with unstable angina pectoris (HCC) Active Problems:   HYPERCHOLESTEROLEMIA   Persistent atrial fibrillation (HCC)   Chronic diastolic CHF (congestive heart failure) ([place])   Essential hypertension   Unstable angina ([place])  1. Atrial fibrillation with RVR chads2vasc score is at least 3.  He is on coumadin V rates are now controlled. QT is stable on sotalol (started yesterday).  Though I have advised that he remain in the hospital another 24 hours for observation, he is very clear that he is leaving today stating that he wants to be at home with family for Christmas.  I have been clear that I think that he should stay.  He understands risks and will leave. Follow-up in AF clinic this week for ekg, bmet, mg.  If still in AF, will need cardioversion arranged at that time.  2. CAD Medical management Consider stopping ASA in the future as he is also on coumadin.  Will defer to Dr Aundra Dubin.  3. htn Stable No change required today  4. Diabetes He says he cannot afford and is not taking insulin at home.  I have encouraged him to discuss this with his primary care physician and with endocrine.  5. OSA Compliance with CPAP encouraged  6. ETOH  Cessation advised He drinks heavily and this is likely part  of the issue  Follow-up later this week in the AF clinic. Follow-up with Dr Aundra Dubin when able.   Thompson Grayer, MD 08/05/2016 9:17 AM

## 2016-08-05 NOTE — Discharge Summary (Addendum)
Discharge Summary    Patient ID: [name] [name]  MRN: [id number], DOB/AGE: Feb 24, 1956 60 y.o.  Admit Date: 08/03/2016 Discharge Date: 08/05/2016  Primary Care Provider: Angelica Chessman, MD Primary Cardiologist: Dr. Aundra Dubin, MD  Discharge Diagnoses    Principal Problem:   Coronary artery disease involving native coronary artery with unstable angina pectoris [facility]) Active Problems:   HYPERCHOLESTEROLEMIA   Persistent atrial fibrillation (HCC)   Chronic diastolic CHF (congestive heart failure) ([place])   Essential hypertension   Unstable angina (HCC)   Allergies Allergies  Allergen Reactions  . Ace Inhibitors Cough  . Other Hives and Other (See Comments)    Patient reports developing hives after receiving "some antibiotic given in 1980''s at [facility]". He does not know which antibiotic.     History of Present Illness     60 year old male with history of CAD s/p DES x 2 in the distal RCA in 2016, PAF/flutter on Coumadin, chronic diastolic CHF/cardiomyopathy suspected mixed NICM with EtOH abuse and ICM, DM, CKD stage I, HTN, and HLD who was admitted to [facility] on 12/22 after noting > 12 hours of exertional chest pain in the office and was found to have a mildly elevated troponin at 0.04.  LHC in 5/13 demonstrated severe distal and branch vessel disease and he was treated withmed Rx.Echo in 5/13 demonstrated normal LVF. Cardiac MRI in 2014 demonstrated EF 44%. In 10/2013 he had an episode of AFib with RVR.Echo 2/16 demonstrated EF 55-60% and moderate diastolic dysfunction. Myoview in 9/16 demonstrated inferior ischemia. He underwent right and left-sided heart catheterization. Filling pressures were not significantly elevated. He did have severe distal RCA stenosis that was treated with DES 2. Repeat echo 9/16 demonstrated EF 55-60% and moderate diastolic dysfunction. He was admitted in 10/16 with acute cholecystitis s/p cholecystotomy tube. Cholecystectomy was delayed due  to the need for antiplatelet Rx after PCI with DES. He was admitted in 0000000 with a/c diastolic CHF in the setting of AFlutter and underwent DCCV. In 3/17 a stress test showed significant ST changes (no ischemia) and a LHC demonstrated extensive, diffuse CAD c/w poorly controlled diabetes and patent RCA stents. Med Rx was continued. He eventually underwent lap cholecystectomy in [id number] without complications.    He was seen in the office on 12/22 with > 12 hours of cramping in his hands and chest tightness. He had just recently restarted work (lays bricks). There was some associated nausea, dyspnea, and diaphoresis. Hospital admission was advised, he refused. He did agree to have an outpatient troponin drawn and if elevated he would be admitted. His troponin in the office was noted to be 0.04 and admission was advised.   Hospital Course     Consultants: Pharmacy/care management   Upon the patient's arrival to [facility] his troponin was flat trending at 0.04-->0.03-->0.04-->0.03. He remained chest pain free. Given he remained chest pain free, did not have significant elevation in his troponin, and his recent LHC above in 10/2015 medical management was advised. It was felt he would not require stress testing given LHC as above this year.   He did develop Afib with RVR on 12/22 with mild SOB. Given he had been on Coumadin with therapeutic INR he was started on sotalol 120 mg bid after risks and benefits were discussed. Potassium, Magnesium, and EKG were followed. Lytes remained at goal. QT was stable on sotalol. Ventricular rates were controlled. His SOB improved. He ambulated in the hallway with pulse ox remaining > 96% on  room air.   CHADS2VASc at least 3 (HTN, DM, vascular disease). Continuation of ASA with Coumadin was deferred to primary cardiologist.   He reported he was having difficulty with his insulin at home. Lantus was decreased to 30 units. He was advised to discuss this with his PCP. He obtains new  insurance 08/2016.   Alcohol cessation was advised.   CPAP compliance was advised.   HTN was stable.   The patient has been seen by Dr. Rayann Heman, MD and recommended he stay in the hospital for another 24 hours for observation. Patient reports he is leaving the hospital today as he wants to be home for Christmas with his family. He was advised of the risks in detail of this decision and wishes to proceed in going home.    Close outpatient follow up in the Afib Clinic has been advised for Thursday, 12/28 with planned recheck of bmet, magnesium, and ekg at that time. He he remains in Afib at that time he will need DCCV to be arranged.  _____________  Discharge Vitals Blood pressure (!) 144/81, pulse 83, temperature 98.1 F (36.7 C), resp. rate 18, height 5\' 11"  (1.803 m), weight 200 lb 11.2 oz (91 kg), SpO2 99 %.  Filed Weights   08/03/16 1357 08/04/16 0400  Weight: 202 lb 9.6 oz (91.9 kg) 200 lb 11.2 oz (91 kg)    Labs & Radiologic Studies    CBC  Recent Labs  08/03/16 1105  WBC 6.9  NEUTROABS 4,347  HGB 13.5  HCT 42.8  MCV 79.9*  PLT [id number]   Basic Metabolic Panel  Recent Labs  08/03/16 1105 08/04/16 1208 08/05/16 0327  NA 132*  --  133*  K 4.0  --  4.0  CL 97*  --  100*  CO2 25  --  23  GLUCOSE 429*  --  252*  BUN 16  --  26*  CREATININE 1.20  --  1.27*  CALCIUM 9.2  --  9.5  MG  --  1.8 1.9   Liver Function Tests  Recent Labs  08/03/16 1105  AST 17  ALT 14  ALKPHOS 75  BILITOT 0.7  PROT 7.7  ALBUMIN 3.8   No results for input(s): LIPASE, AMYLASE in the last 72 hours. Cardiac Enzymes  Recent Labs  08/03/16 1503 08/03/16 2032 08/04/16 0230  TROPONINI 0.03* 0.04* 0.03*   BNP Invalid input(s): POCBNP D-Dimer No results for input(s): DDIMER in the last 72 hours. Hemoglobin A1C No results for input(s): HGBA1C in the last 72 hours. Fasting Lipid Panel No results for input(s): CHOL, HDL, LDLCALC, TRIG, CHOLHDL, LDLDIRECT in the last 72  hours. Thyroid Function Tests No results for input(s): TSH, T4TOTAL, T3FREE, THYROIDAB in the last 72 hours.  Invalid input(s): FREET3 _____________  No results found.  Diagnostic Studies/Procedures   N/A _____________  Disposition   Pt is being discharged home today in good condition.  Follow-up Plans & Appointments    Follow-up Information    [facility] Follow up.   Specialty:  Cardiology Why:  Message sent to the office for hospital follow up. If patient has not heard from the office by 12/28 he is to call the office for an appointment.  Contact information: [address], [address] [place] Follow up.   Specialty:  Cardiology Why:  Dr. Rayann Heman sent a message for the patient to be seen this  Thursday, 08/09/2016. Contact information: [address] [id number] [place] [phone]           Discharge Medications   Current Discharge Medication List    START taking these medications   Details  sotalol (BETAPACE) 120 MG tablet Take 1 tablet (120 mg total) by mouth every 12 (twelve) hours. Qty: 60 tablet, Refills: 5      CONTINUE these medications which have CHANGED   Details  Insulin Glargine (LANTUS SOLOSTAR) 100 UNIT/ML Solostar Pen Inject 30 Units into the skin every morning. And pen needles 1/day Qty: 30 mL, Refills: 3   Associated Diagnoses: Type 2 diabetes mellitus without complication, with long-term current use of insulin (HCC)      CONTINUE these medications which have NOT CHANGED   Details  acetaminophen (TYLENOL) 500 MG tablet Take 1,000 mg by mouth every 6 (six) hours as needed for headache (pain). Reported on 08/18/2015    amLODipine (NORVASC) 5 MG tablet Take 1 tablet (5 mg total) by mouth daily. Qty: 90 tablet, Refills: 3   Associated Diagnoses: Essential hypertension    aspirin EC 81 MG tablet  Take 1 tablet (81 mg total) by mouth daily. DO NOT START UNTIL YOU FINISH PLAVIX   Associated Diagnoses: Paroxysmal atrial fibrillation (HCC)    atorvastatin (LIPITOR) 80 MG tablet Take 80 mg by mouth daily.    carvedilol (COREG) 12.5 MG tablet Take 1.5 tablets (18.75 mg total) by mouth 2 (two) times daily with a meal. Qty: 180 tablet, Refills: 3   Associated Diagnoses: Coronary artery disease involving native coronary artery of native heart with unstable angina pectoris (HCC)    furosemide (LASIX) 20 MG tablet Take 1 tablet (20 mg total) by mouth daily. Take 2 tablets in the morning and 1 in the evening. Qty: 90 tablet, Refills: 2    hydrALAZINE (APRESOLINE) 50 MG tablet Take 50 mg by mouth 2 (two) times daily.    metFORMIN (GLUCOPHAGE) 1000 MG tablet Take 1 tablet (1,000 mg total) by mouth 2 (two) times daily with a meal. Qty: 180 tablet, Refills: 3   Associated Diagnoses: Type 2 diabetes mellitus with diabetic peripheral angiopathy without gangrene, with long-term current use of insulin (HCC)    potassium chloride SA (K-DUR,KLOR-CON) 20 MEQ tablet Take 10 mEq by mouth daily.     spironolactone (ALDACTONE) 25 MG tablet Take 1 tablet (25 mg total) by mouth daily. Qty: 90 tablet, Refills: 3   Associated Diagnoses: Coronary artery disease involving native coronary artery of native heart with angina pectoris (HCC)    warfarin (COUMADIN) 5 MG tablet TAKE AS DIRECTED BY COUMADIN CLINIC Qty: 90 tablet, Refills: 1   Associated Diagnoses: Essential hypertension    nitroGLYCERIN (NITROSTAT) 0.4 MG SL tablet Place 1 tablet (0.4 mg total) under the tongue every 5 (five) minutes as needed for chest pain (x 3 tabs daily). Reported on 08/18/2015 Qty: 100 tablet, Refills: 1   Associated Diagnoses: Coronary artery disease involving native coronary artery of native heart without angina pectoris    oxyCODONE (OXY IR/ROXICODONE) 5 MG immediate release tablet Take 1-2 tablets (5-10 mg total) by mouth  every 4 (four) hours as needed for moderate pain. Qty: 30 tablet, Refills: 0         Aspirin prescribed at discharge?  Yes High Intensity Statin Prescribed? (Lipitor 40-80mg  or Crestor 20-40mg ): Yes Beta Blocker Prescribed? Yes For EF <40%, was ACEI/ARB Prescribed? No: EF > 40% ADP Receptor Inhibitor Prescribed? (i.e. Plavix etc.-Includes Medically Managed  Patients): No: N/A For EF <40%, Aldosterone Inhibitor Prescribed? Yes: EF > 40% Was EF assessed during THIS hospitalization? No: Recent LHC 10/2015 and echo 01/2016 Was Cardiac Rehab II ordered? (Included Medically managed Patients): No   Outstanding Labs/Studies   None. Will need recheck bmet, ekg, and magnesium in Afib Clinic follow up this week. Dr. Rayann Heman has set up [facility] [place] appointment. He will also follow up with Dr. Aundra Dubin for hospital follow up.   Duration of Discharge Encounter   Greater than 30 minutes including physician time.  Signed, Rise Mu, PA-C [facility] Pager: [phone] 08/05/2016, 11:23 AM  Thompson Grayer MD, [facility] 08/05/2016 11:23 AM   Trude Mcburney

## 2016-08-05 NOTE — Progress Notes (Signed)
Discharge instructions reviewed with patient. Patient has no questions at this time. Prescription given to patient. IV d/c.

## 2016-08-06 NOTE — Progress Notes (Deleted)
Cardiology Office Note:    Date:  08/06/2016   ID:  [name] [name], DOB June 27, 1956, MRN [id number]  PCP:  Angelica Chessman, MD  Cardiologist:Dr. Kirk Ruths McLean(CHF clinic) Electrophysiologist: N/a Gen Surgeon: Dr. Redmond Pulling  Referring MD: Tresa Garter, MD   No chief complaint on file. ***  History of Present Illness:    [name] [name] is a 60 y.o. male with a hx of HTN, DM, PAF, CAD, CKD. LHC in 5/13 demonstrated severe distal and branch vessel disease and he was treated withmed Rx. Echo in 5/13 demonstrated normal LVF. Cardiac MRI in 2014 demonstrated EF 44%. In 10/2013 he had an episode of AFib with RVR. Echo 2/16 demonstrated EF 55-60% and moderate diastolic dysfunction. Myoview in 9/16 demonstrated inferior ischemia. He underwent right and left-sided heart catheterization. Filling pressures were not significantly elevated. He did have severe distal RCA stenosis that was treated with DES 2. Repeat echo 9/16 demonstrated EF 55-60% and moderate diastolic dysfunction. He was admitted in 10/16 with acute cholecystitis s/p cholecystotomy tube.  Cholecystectomy was delayed due to the need for antiplatelet Rx after PCI with DES.  He was admitted in 0000000 with a/c diastolic CHF in the setting of AFlutter and underwent DCCV.  In 3/17 a stress test showed significant ST changes (no ischemia) and a LHC demonstrated extensive, diffuse CAD c/w poorly controlled diabetes and patent RCA stents. Med Rx was continued. He eventually underwent lap cholecystectomy in [id number] without complications.    He was admitted from the office last week with chest pain and minimally elevated stat Troponin.  Serial cardiac enzymes did not show a significant change and medical Rx was continued.  Hospital stay was c/b AF with RVR.  Sotalol was started at 120 bid.  On 12/24, the patient insisted on leaving the hospital despite the recommendation that he be observed for 24 more hours after initiation Sotalol.   FU in the AFib Clinic was arranged 12/28.  If he remained in AFib at that time, DCCV could be arranged.    ***  Prior CV studies that were reviewed today include:    Echo 01/13/16 Severe focal basal and mod conc LVH, EF 55-60%, no RWMA, Gr 2 DD, mild AI, mild MR, mild LAE  LHC 11/04/15 LM 20% distal  LAD 40% ostial, 40% proximal, Diffuse moderate distal vessel disease.  RI 50% ostial/proximal,60-70% mid, Diffuse distal and branch vessel disease.  LCx - AV LCx was relatively small with diffuse mild-moderate disease.  RCA - Patent ostial and distal RCA stents, 40% mid, Diffuse mild to moderate distal disease. Extensive CAD but no severe/critical lesions. He has diffuse disease consistent with his poorly controlled diabetes. Stents in the RCA are patent. Continue medical management.   Myoview 11/01/15 + chest pain; EF 33%, downsloping ST depression 2, V4-6, LVH with strain, no ischemia on images; intermediate risk   R/L [facility] 05/03/15 PCI: 2.5 x 16 Synergy DES to the distal RCA, 3 x 12 Synergy DES to the proximal RCA  Myoview 9/16 Partially reversible (SDS 4) moderate-sized and intensity inferior perfusion defect, which suggests ischemia, however patient motion noted during the study and mis-registration artifact is possible. Dilated LV with severe hypokinesis - LVEF 26%. This is a high risk study.  Echo 9/16 Mild LVH, EF 55-60%, normal wall motion, grade 2 diastolic dysfunction, trivial AI, mild MR, moderate LAE, normal RVSF, mild TR  LHC (5/13):  LHC (5/13) with diffuse distal and branch vessel disease, mild global hypokinesis and EF 45%  Echo (5/14):  inferbasal HK, mild LVH, EF 50-55%, trivial AI, mild MR, mild LAE  Nuclear (5/14):  Inf defect c/w thinning vs infarct, no ischemia, EF 31%; High Risk  cMRI (5/14):  Mild LVH, EF 44%, normal RV size and function, small areas of subepicardial delayed enhancement of the mid anteroseptal and mid inferoseptal RV insertion  sites-nonspecific finding   Past Medical History:  Diagnosis Date  . Anginal pain ([place])   . Anxiety   . Arrhythmia   . CAD (coronary artery disease), native coronary artery    a. Nonobstructive CAD by cath 2013 - diffuse distal and branch vessel CAD, no severe disease in the major coronaries, LV mild global hypokinesis, EF 45%. b. ETT-Sestamibi 5/14: EF 31%, small fixed inferior defect with no ischemia.  . Cholecystitis   . Chronic CHF ([place])    a. Mixed ICM/NICM (?EtOH). EF 35% in 2008. Echo 5/13: EF 60-65%, mod LVH, EF 45% on V gram in 12/2011. EF 12/2012: EF 50-55%, mild LVH, inferobasal HK, mild MR. ETT-Ses 5/14 EF 41%. Cardiac MRI 5/14: EF 44%, mild global HK, subepicardial delayed enhancement in nonspecific RV insertion pattern.  . COLONIC POLYPS, HX OF 12/30/2006  . Gout   . H/O atrial flutter 2007   a. Ablations in 2007, 2008.  [name] Heart murmur   . HEPATITIS B, CHRONIC 12/30/2006  . History of alcohol abuse   . History of hiatal hernia   . History of medication noncompliance   . History of nuclear stress test    Myoview 3/17: + chest pain; EF 33%, downsloping ST depression 2, V4-6, LVH with strain, no ischemia on images; intermediate risk   . HYPERCHOLESTEROLEMIA 07/11/2010  . Hypertension   . Left sciatic nerve pain since 04/2015  . LIVER FUNCTION TESTS, ABNORMAL, HX OF 12/30/2006  . MITRAL REGURGITATION 12/30/2006  . 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 PAF (paroxysmal atrial fibrillation) (HCC)    On coumadin  . Rectal bleeding 12/18/2011   Scheduled for colonoscopy.    . Shortness of breath dyspnea    with excertion  . Sleep apnea    "suppose to send mask but they never did" (05/03/2015)  . Type II diabetes mellitus ([place])     Past Surgical History:  Procedure Laterality Date  . A FLUTTER ABLATION  2007, 2008   catheter ablation   . CARDIAC CATHETERIZATION N/A 05/03/2015   Procedure: Right/Left Heart Cath and Coronary Angiography;  Surgeon: Larey Dresser,  MD;  Location: [place] CV LAB;  Service: Cardiovascular;  Laterality: N/A;  . CARDIAC CATHETERIZATION N/A 05/03/2015   Procedure: Coronary Stent Intervention;  Surgeon: Jettie Booze, MD;  Location: [place] CV LAB;  Service: Cardiovascular;  Laterality: N/A;  . CARDIAC CATHETERIZATION N/A 11/04/2015   Procedure: Left Heart Cath and Coronary Angiography;  Surgeon: Larey Dresser, MD;  Location: [place] CV LAB;  Service: Cardiovascular;  Laterality: N/A;  . CARDIOVERSION N/A 08/24/2015   Procedure: CARDIOVERSION;  Surgeon: Thayer Headings, MD;  Location: [facility] ENDOSCOPY;  Service: Cardiovascular;  Laterality: N/A;  . CHOLECYSTECTOMY N/A 02/07/2016   Procedure: LAPAROSCOPIC CHOLECYSTECTOMY WITH  INTRAOPERATIVE CHOLANGIOGRAM;  Surgeon: Greer Pickerel, MD;  Location: [facility];  Service: General;  Laterality: N/A;  . CORONARY ANGIOPLASTY  12/05/01  . LEFT HEART CATHETERIZATION WITH CORONARY ANGIOGRAM N/A 12/19/2011   Procedure: LEFT HEART CATHETERIZATION WITH CORONARY ANGIOGRAM;  Surgeon: Larey Dresser, MD;  Location[facility] CATH LAB;  Service: Cardiovascular;  Laterality: N/A;  .  OSTEOCHONDROMA EXCISION Left 1972   "took bone tumor off my shoulder"    Current Medications: No outpatient prescriptions have been marked as taking for the 08/07/16 encounter (Appointment) with [name] Shi, PA-C.     Allergies:   Ace inhibitors and Other   Social History   Social History  . Marital status: Widowed    Spouse name: N/A  . Number of children: 2  . Years of education: N/A   Occupational History  .  Unemployed   Social History Main Topics  . Smoking status: Former Smoker    Packs/day: 4.00    Years: 18.00    Types: Cigarettes    Quit date: 08/14/1983  . Smokeless tobacco: Never Used  . Alcohol use 9.6 oz/week    6 Cans of beer, 10 Shots of liquor per week     Comment: occasionally  . Drug use:     Types: Marijuana     Comment: occasionally  . Sexual activity: No   Other Topics  Concern  . Not on file   Social History Narrative  . No narrative on file     Family History:  The patient's ***family history includes Diabetes in his mother; Hypertension in his mother.   ROS:   Please see the history of present illness.    ROS All other systems reviewed and are negative.   EKGs/Labs/Other Test Reviewed:    EKG:  EKG is *** ordered today.  The ekg ordered today demonstrates ***  Recent Labs: 10/12/2015: Brain Natriuretic Peptide 45.5 08/03/2016: ALT 14; Hemoglobin 13.5; Platelets 216 08/05/2016: BUN 26; Creatinine, Ser 1.27; Magnesium 1.9; Potassium 4.0; Sodium 133   Recent Lipid Panel    Component Value Date/Time   CHOL 104 (L) 07/12/2015 0920   TRIG 78 07/12/2015 0920   HDL 44 07/12/2015 0920   CHOLHDL 2.4 07/12/2015 0920   VLDL 16 07/12/2015 0920   LDLCALC 44 07/12/2015 0920   LDLDIRECT 111.0 04/22/2015 1559     Physical Exam:    VS:  There were no vitals taken for this visit.    Wt Readings from Last 3 Encounters:  08/04/16 200 lb 11.2 oz (91 kg)  08/03/16 206 lb (93.4 kg)  06/12/16 213 lb 8 oz (96.8 kg)     ***Physical Exam  ASSESSMENT:    No diagnosis found. PLAN:    In order of problems listed above:  1. CAD - s/p DES x 2 to the RCA in 9/16. LHC in 3/17 with stable anatomy.  Recent admission with chest pain and minimally elevated Troponin levels without clear trend.  Med Rx was continued.  *** - Continue statin, beta blocker, Amlodipine, ASA.*** 2. AF/Flutter - Hx of acute CHF in thesetting of AFlutter. He underwent DCCV in 1/17. CHADS2-VASc=4. He had recurrent AF with RVR during his admission last week and was put on Sotalol.  ***  -  Continue Coumadin.    -  AFib Clinic*** 3. Hypertension -*** 4. Chronic Diastolic CHF - *** 6. CKD - ***  Medication Adjustments/Labs and Tests Ordered: Current medicines are reviewed at length with the patient today.  Concerns regarding medicines are outlined above.   Medication changes, Labs and Tests ordered today are outlined in the Patient Instructions noted below. There are no Patient Instructions on file for this visit. Signed, Richardson Dopp, PA-C  08/06/2016 8:07 PM    [facility] [place], [place], [place]  [address] Phone: [phone]; Fax: [phone]

## 2016-08-07 ENCOUNTER — Ambulatory Visit: Payer: Medicare Other | Admitting: Physician Assistant

## 2016-08-08 ENCOUNTER — Telehealth (HOSPITAL_COMMUNITY): Payer: Self-pay | Admitting: *Deleted

## 2016-08-08 NOTE — Telephone Encounter (Signed)
-----   Message from Patsey Berthold, NP sent at 08/07/2016  6:52 PM EST ----- This patient had an appt with scott today that he no-showed for.  I was waiting until after that to see if I needed to make Thursday's with ya'll.  Thursday was full and so I scheduled for Friday.  Please call patient and let him know about appt.  Sotalol load recently.  Thanks! Amber  ----- Message ----- From: Thompson Grayer, MD Sent: 08/05/2016   9:17 AM To: Patsey Berthold, NP  Please arrange follow-up in AF clinic for Thursday.  Sotalol initiation.

## 2016-08-08 NOTE — Telephone Encounter (Signed)
Pt notified of the follow up appt on Friday with afib clinic.  Pt stated he would be here and was given directions

## 2016-08-10 ENCOUNTER — Ambulatory Visit (HOSPITAL_COMMUNITY)
Admission: RE | Admit: 2016-08-10 | Discharge: 2016-08-10 | Disposition: A | Discharge status: Home / Self Care | Payer: Medicare Other | Source: Ambulatory Visit | Attending: Nurse Practitioner | Admitting: Nurse Practitioner

## 2016-08-10 ENCOUNTER — Encounter (HOSPITAL_COMMUNITY): Payer: Self-pay | Admitting: Nurse Practitioner

## 2016-08-10 ENCOUNTER — Other Ambulatory Visit (HOSPITAL_COMMUNITY): Payer: Self-pay | Admitting: *Deleted

## 2016-08-10 ENCOUNTER — Other Ambulatory Visit: Payer: Self-pay | Admitting: *Deleted

## 2016-08-10 VITALS — BP 126/70 | HR 68 | Ht 71.0 in | Wt 210.2 lb

## 2016-08-10 DIAGNOSIS — Z79899 Other long term (current) drug therapy: Secondary | ICD-10-CM | POA: Diagnosis not present

## 2016-08-10 DIAGNOSIS — I2510 Atherosclerotic heart disease of native coronary artery without angina pectoris: Secondary | ICD-10-CM | POA: Insufficient documentation

## 2016-08-10 DIAGNOSIS — I5032 Chronic diastolic (congestive) heart failure: Secondary | ICD-10-CM | POA: Insufficient documentation

## 2016-08-10 DIAGNOSIS — Z7901 Long term (current) use of anticoagulants: Secondary | ICD-10-CM | POA: Diagnosis not present

## 2016-08-10 DIAGNOSIS — I130 Hypertensive heart and chronic kidney disease with heart failure and stage 1 through stage 4 chronic kidney disease, or unspecified chronic kidney disease: Secondary | ICD-10-CM | POA: Diagnosis not present

## 2016-08-10 DIAGNOSIS — N181 Chronic kidney disease, stage 1: Secondary | ICD-10-CM | POA: Insufficient documentation

## 2016-08-10 DIAGNOSIS — Z87891 Personal history of nicotine dependence: Secondary | ICD-10-CM | POA: Diagnosis not present

## 2016-08-10 DIAGNOSIS — I480 Paroxysmal atrial fibrillation: Secondary | ICD-10-CM | POA: Insufficient documentation

## 2016-08-10 DIAGNOSIS — Z794 Long term (current) use of insulin: Secondary | ICD-10-CM | POA: Diagnosis not present

## 2016-08-10 DIAGNOSIS — I4819 Other persistent atrial fibrillation: Secondary | ICD-10-CM

## 2016-08-10 DIAGNOSIS — E1122 Type 2 diabetes mellitus with diabetic chronic kidney disease: Secondary | ICD-10-CM | POA: Insufficient documentation

## 2016-08-10 DIAGNOSIS — I4891 Unspecified atrial fibrillation: Secondary | ICD-10-CM | POA: Diagnosis present

## 2016-08-10 DIAGNOSIS — E785 Hyperlipidemia, unspecified: Secondary | ICD-10-CM | POA: Diagnosis not present

## 2016-08-10 DIAGNOSIS — Z7982 Long term (current) use of aspirin: Secondary | ICD-10-CM | POA: Diagnosis not present

## 2016-08-10 DIAGNOSIS — I429 Cardiomyopathy, unspecified: Secondary | ICD-10-CM | POA: Insufficient documentation

## 2016-08-10 DIAGNOSIS — G4733 Obstructive sleep apnea (adult) (pediatric): Secondary | ICD-10-CM

## 2016-08-10 LAB — BASIC METABOLIC PANEL
Anion gap: 10 (ref 5–15)
BUN: 16 mg/dL (ref 6–20)
CALCIUM: 9.3 mg/dL (ref 8.9–10.3)
CHLORIDE: 104 mmol/L (ref 101–111)
CO2: 22 mmol/L (ref 22–32)
CREATININE: 1.2 mg/dL (ref 0.61–1.24)
GFR calc Af Amer: >60 mL/min (ref >60)
GFR calc non Af Amer: >60 mL/min (ref >60)
GLUCOSE: 249 mg/dL — AB (ref 65–99)
Potassium: 3.9 mmol/L (ref 3.5–5.1)
Sodium: 136 mmol/L (ref 135–145)

## 2016-08-10 LAB — MAGNESIUM: Magnesium: 1.8 mg/dL (ref 1.7–2.4)

## 2016-08-10 MED ORDER — POTASSIUM CHLORIDE CRYS ER 20 MEQ PO TBCR: 20.0000 meq | EXTENDED_RELEASE_TABLET | Freq: Every day | ORAL | 6 refills | Status: DC

## 2016-08-10 MED ORDER — MAGNESIUM 200 MG PO TABS: 200.0000 mg | ORAL_TABLET | Freq: Every day | ORAL | Status: DC

## 2016-08-10 NOTE — Progress Notes (Signed)
Primary Care Physician: Angelica Chessman, MD Referring Physician:[facility] f/u Cardiologist: Dr. Delorise Jackson [name] [name] is a 60 y.o. male with a h/o 61 year old male with history of CAD s/p DES x 2 in the distal RCA in 2016, PAF/flutter on Coumadin, chronic diastolic CHF/cardiomyopathy suspected mixed NICM with EtOH abuse and ICM, DM, CKD stage I, HTN, and HLD who was admitted to [facility] on 12/22 after noting > 12 hours of exertional chest pain in the office and was found to have a mildly elevated troponin at 0.04.  Upon the patient's arrival to [facility] his troponin was flat trending at 0.04-->0.03-->0.04-->0.03. He remained chest pain free. Given he remained chest pain free, did not have significant elevation in his troponin, and his recent LHC above in 10/2015 medical management was advised. It was felt he would not require stress testing given LHC  previously done in 3/17 when medical management was advised.  He did develop Afib with RVR on 12/22 with mild SOB. Given he had been on Coumadin with therapeutic INR he was started on sotalol 120 mg bid after risks and benefits were discussed. Potassium, Magnesium, and EKG were followed. Lytes remained at goal. QT was stable on sotalol. Ventricular rates were controlled. His SOB improved. He ambulated in the hallway with pulse ox remaining > 96% on room air.  He left prior to full loading of sotalol due to wanting to get home for the holidays.  In the clinic today, he is in [place] with stable qtc. He feels well and states compliance with sotalol.  Today, he denies symptoms of palpitations, chest pain, shortness of breath, orthopnea, PND, lower extremity edema, dizziness, presyncope, syncope, or neurologic sequela. The patient is tolerating medications without difficulties and is otherwise without complaint today.   Past Medical History:  Diagnosis Date  . Anginal pain ([place])   . Anxiety   . Arrhythmia   . CAD (coronary artery disease), native coronary artery     a. Nonobstructive CAD by cath 2013 - diffuse distal and branch vessel CAD, no severe disease in the major coronaries, LV mild global hypokinesis, EF 45%. b. ETT-Sestamibi 5/14: EF 31%, small fixed inferior defect with no ischemia.  . Cholecystitis   . Chronic CHF ([place])    a. Mixed ICM/NICM (?EtOH). EF 35% in 2008. Echo 5/13: EF 60-65%, mod LVH, EF 45% on V gram in 12/2011. EF 12/2012: EF 50-55%, mild LVH, inferobasal HK, mild MR. ETT-Ses 5/14 EF 41%. Cardiac MRI 5/14: EF 44%, mild global HK, subepicardial delayed enhancement in nonspecific RV insertion pattern.  . COLONIC POLYPS, HX OF 12/30/2006  . Gout   . H/O atrial flutter 2007   a. Ablations in 2007, 2008.  [name] Heart murmur   . HEPATITIS B, CHRONIC 12/30/2006  . History of alcohol abuse   . History of hiatal hernia   . History of medication noncompliance   . History of nuclear stress test    Myoview 3/17: + chest pain; EF 33%, downsloping ST depression 2, V4-6, LVH with strain, no ischemia on images; intermediate risk   . HYPERCHOLESTEROLEMIA 07/11/2010  . Hypertension   . Left sciatic nerve pain since 04/2015  . LIVER FUNCTION TESTS, ABNORMAL, HX OF 12/30/2006  . MITRAL REGURGITATION 12/30/2006  . 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 PAF (paroxysmal atrial fibrillation) (HCC)    On coumadin  . Rectal bleeding 12/18/2011   Scheduled for colonoscopy.    . Shortness of breath dyspnea  with excertion  . Sleep apnea    "suppose to send mask but they never did" (05/03/2015)  . Type II diabetes mellitus ([place])    Past Surgical History:  Procedure Laterality Date  . A FLUTTER ABLATION  2007, 2008   catheter ablation   . CARDIAC CATHETERIZATION N/A 05/03/2015   Procedure: Right/Left Heart Cath and Coronary Angiography;  Surgeon: Larey Dresser, MD;  Location: [name] CV LAB;  Service: Cardiovascular;  Laterality: N/A;  . CARDIAC CATHETERIZATION N/A 05/03/2015   Procedure: Coronary Stent Intervention;  Surgeon: Jettie Booze, MD;  Location: [place] CV LAB;  Service: Cardiovascular;  Laterality: N/A;  . CARDIAC CATHETERIZATION N/A 11/04/2015   Procedure: Left Heart Cath and Coronary Angiography;  Surgeon: Larey Dresser, MD;  Location: [place] [facility] LAB;  Service: Cardiovascular;  Laterality: N/A;  . CARDIOVERSION N/A 08/24/2015   Procedure: CARDIOVERSION;  Surgeon: Thayer Headings, MD;  Location: [facility] ENDOSCOPY;  Service: Cardiovascular;  Laterality: N/A;  . CHOLECYSTECTOMY N/A 02/07/2016   Procedure: LAPAROSCOPIC CHOLECYSTECTOMY WITH  INTRAOPERATIVE CHOLANGIOGRAM;  Surgeon: Greer Pickerel, MD;  Location: [facility];  Service: General;  Laterality: N/A;  . CORONARY ANGIOPLASTY  12/05/01  . LEFT HEART CATHETERIZATION WITH CORONARY ANGIOGRAM N/A 12/19/2011   Procedure: LEFT HEART CATHETERIZATION WITH CORONARY ANGIOGRAM;  Surgeon: Larey Dresser, MD;  Location: [facility] CATH LAB;  Service: Cardiovascular;  Laterality: N/A;  . OSTEOCHONDROMA EXCISION Left 1972   "took bone tumor off my shoulder"    Current Outpatient Prescriptions  Medication Sig Dispense Refill  . acetaminophen (TYLENOL) 500 MG tablet Take 1,000 mg by mouth every 6 (six) hours as needed for headache (pain). Reported on 08/18/2015    . amLODipine (NORVASC) 5 MG tablet Take 1 tablet (5 mg total) by mouth daily. 90 tablet 3  . aspirin EC 81 MG tablet Take 1 tablet (81 mg total) by mouth daily. DO NOT START UNTIL YOU FINISH PLAVIX (Patient taking differently: Take 81 mg by mouth daily. )    . atorvastatin (LIPITOR) 80 MG tablet Take 80 mg by mouth daily.    . carvedilol (COREG) 12.5 MG tablet Take 1.5 tablets (18.75 mg total) by mouth 2 (two) times daily with a meal. 180 tablet 3  . furosemide (LASIX) 20 MG tablet Take 1 tablet (20 mg total) by mouth daily. Take 2 tablets in the morning and 1 in the evening. (Patient taking differently: Take 40 mg by mouth 2 (two) times daily. Take 2 tablets in the morning and 1 in the evening) 90 tablet 2  . hydrALAZINE  (APRESOLINE) 50 MG tablet Take 50 mg by mouth 2 (two) times daily.    . Insulin Glargine (LANTUS SOLOSTAR) 100 UNIT/ML Solostar Pen Inject 30 Units into the skin every morning. And pen needles 1/day 30 mL 3  . metFORMIN (GLUCOPHAGE) 1000 MG tablet Take 1 tablet (1,000 mg total) by mouth 2 (two) times daily with a meal. 180 tablet 3  . nitroGLYCERIN (NITROSTAT) 0.4 MG SL tablet Place 1 tablet (0.4 mg total) under the tongue every 5 (five) minutes as needed for chest pain (x 3 tabs daily). Reported on 08/18/2015 100 tablet 1  . oxyCODONE (OXY IR/ROXICODONE) 5 MG immediate release tablet Take 1-2 tablets (5-10 mg total) by mouth every 4 (four) hours as needed for moderate pain. 30 tablet 0  . sotalol (BETAPACE) 120 MG tablet Take 1 tablet (120 mg total) by mouth every 12 (twelve) hours. 60 tablet 5  . spironolactone (ALDACTONE)  25 MG tablet Take 1 tablet (25 mg total) by mouth daily. (Patient taking differently: Take 25 mg by mouth at bedtime. ) 90 tablet 3  . warfarin (COUMADIN) 5 MG tablet TAKE AS DIRECTED BY COUMADIN CLINIC (Patient taking differently: TAKE 1/2 TABLET ON TUES, THURS, SAT AT BEDTIME, TAKE 1 TABLET ON SUN, MON, WED, FRI AT BEDTIME OR AS DIRECTED BY COUMADIN CLINIC) 90 tablet 1  . potassium chloride SA (K-DUR,KLOR-CON) 20 MEQ tablet Take 10 mEq by mouth daily.      No current facility-administered medications for this encounter.     Allergies  Allergen Reactions  . Ace Inhibitors Cough  . Other Hives and Other (See Comments)    Patient reports developing hives after receiving "some antibiotic given in 1980''s at [facility]". He does not know which antibiotic.    Social History   Social History  . Marital status: Widowed    Spouse name: N/A  . Number of children: 2  . Years of education: N/A   Occupational History  .  Unemployed   Social History Main Topics  . Smoking status: Former Smoker    Packs/day: 4.00    Years: 18.00    Types: Cigarettes    Quit date:  08/14/1983  . Smokeless tobacco: Never Used  . Alcohol use 9.6 oz/week    6 Cans of beer, 10 Shots of liquor per week     Comment: occasionally  . Drug use:     Types: Marijuana     Comment: occasionally  . Sexual activity: No   Other Topics Concern  . Not on file   Social History Narrative  . No narrative on file    Family History  Problem Relation Age of Onset  . Diabetes Mother   . Hypertension Mother   . Heart attack Neg Hx   . Stroke Neg Hx     ROS- All systems are reviewed and negative except as per the HPI above  Physical Exam: Vitals:   08/10/16 1002  BP: 126/70  Pulse: 68  Weight: 210 lb 3.2 oz (95.3 kg)  Height: 5\' 11"  (1.803 m)   Wt Readings from Last 3 Encounters:  08/10/16 210 lb 3.2 oz (95.3 kg)  08/04/16 200 lb 11.2 oz (91 kg)  08/03/16 206 lb (93.4 kg)    Labs: Lab Results  Component Value Date   NA 136 08/10/2016   K 3.9 08/10/2016   CL 104 08/10/2016   CO2 22 08/10/2016   GLUCOSE 249 (H) 08/10/2016   BUN 16 08/10/2016   CREATININE 1.20 08/10/2016   CALCIUM 9.3 08/10/2016   PHOS 2.6 07/31/2013   MG 1.8 08/10/2016   Lab Results  Component Value Date   INR 3.10 08/05/2016   Lab Results  Component Value Date   CHOL 104 (L) 07/12/2015   HDL 44 07/12/2015   LDLCALC 44 07/12/2015   TRIG 78 07/12/2015     GEN- The patient is well appearing, alert and oriented x 3 today.   Head- normocephalic, atraumatic Eyes-  Sclera clear, conjunctiva pink Ears- hearing intact Oropharynx- clear Neck- supple, no JVP Lymph- no cervical lymphadenopathy Lungs- Clear to ausculation bilaterally, normal work of breathing Heart- Regular rate and rhythm, no murmurs, rubs or gallops, PMI not laterally displaced GI- soft, NT, ND, + BS Extremities- no clubbing, cyanosis, or edema MS- no significant deformity or atrophy Skin- no rash or lesion Psych- euthymic mood, full affect Neuro- strength and sensation are intact  EKG-NSR, normal  EKG at 68 bpm, pr  int 186 ms, qrs int 90 ms, qtc 474 ms(stable) Epic records reviewed    Assessment and Plan: 1. Paroxysmal atrial fibrillation Loaded on sotalol with restoration of SR with stable qtc Stressed to pt importance  to take sotalol on a regular basis, not to miss doses and not to run out of drug(h/o noncompliance) Continue warfain Continue metoprolol Bmet/mag today  2. CAD Stable Continue atorvastatin Continue asa  3. Lifestyle issues Stressed compliance with medical therapy Discouraged use of alcohol more than 2 drinks a week Encouraged sleep study completion for OSA, pt failed to follow up on cpap titration in 2016 and again this fall when Dr. Aundra Dubin set up another complete sleep study He states that he is willing to go thru with it now, message sent thru EPIC to reschedule  4. CHF, chronic Fluid status stable Avoid salt Lasix, spironolactone ,BB, hydralazine as ordered  F/u in one month in afib clinic   Rashawnda Gaba C. Kirby Cortese, [facility] [address] [place], [place] [address] [phone]

## 2016-08-22 ENCOUNTER — Encounter: Payer: Self-pay | Admitting: Internal Medicine

## 2016-08-22 ENCOUNTER — Ambulatory Visit: Payer: Medicare Other | Attending: Internal Medicine | Admitting: Internal Medicine

## 2016-08-22 VITALS — BP 135/70 | HR 75 | Temp 98.5°F | Resp 18 | Ht 71.0 in | Wt 211.4 lb

## 2016-08-22 DIAGNOSIS — I25119 Atherosclerotic heart disease of native coronary artery with unspecified angina pectoris: Secondary | ICD-10-CM

## 2016-08-22 DIAGNOSIS — E119 Type 2 diabetes mellitus without complications: Secondary | ICD-10-CM | POA: Diagnosis not present

## 2016-08-22 DIAGNOSIS — I509 Heart failure, unspecified: Secondary | ICD-10-CM | POA: Insufficient documentation

## 2016-08-22 DIAGNOSIS — Z794 Long term (current) use of insulin: Secondary | ICD-10-CM | POA: Diagnosis not present

## 2016-08-22 DIAGNOSIS — I25110 Atherosclerotic heart disease of native coronary artery with unstable angina pectoris: Secondary | ICD-10-CM | POA: Diagnosis not present

## 2016-08-22 DIAGNOSIS — Z7901 Long term (current) use of anticoagulants: Secondary | ICD-10-CM | POA: Diagnosis not present

## 2016-08-22 DIAGNOSIS — E785 Hyperlipidemia, unspecified: Secondary | ICD-10-CM | POA: Diagnosis not present

## 2016-08-22 DIAGNOSIS — I481 Persistent atrial fibrillation: Secondary | ICD-10-CM | POA: Insufficient documentation

## 2016-08-22 DIAGNOSIS — Z79899 Other long term (current) drug therapy: Secondary | ICD-10-CM | POA: Insufficient documentation

## 2016-08-22 DIAGNOSIS — Z7982 Long term (current) use of aspirin: Secondary | ICD-10-CM | POA: Insufficient documentation

## 2016-08-22 DIAGNOSIS — I10 Essential (primary) hypertension: Secondary | ICD-10-CM

## 2016-08-22 DIAGNOSIS — E1151 Type 2 diabetes mellitus with diabetic peripheral angiopathy without gangrene: Secondary | ICD-10-CM

## 2016-08-22 DIAGNOSIS — K029 Dental caries, unspecified: Secondary | ICD-10-CM | POA: Insufficient documentation

## 2016-08-22 DIAGNOSIS — I110 Hypertensive heart disease with heart failure: Secondary | ICD-10-CM | POA: Insufficient documentation

## 2016-08-22 DIAGNOSIS — I480 Paroxysmal atrial fibrillation: Secondary | ICD-10-CM | POA: Diagnosis not present

## 2016-08-22 DIAGNOSIS — K8010 Calculus of gallbladder with chronic cholecystitis without obstruction: Secondary | ICD-10-CM | POA: Diagnosis not present

## 2016-08-22 DIAGNOSIS — I4819 Other persistent atrial fibrillation: Secondary | ICD-10-CM

## 2016-08-22 LAB — GLUCOSE, POCT (MANUAL RESULT ENTRY): POC GLUCOSE: 169 mg/dL — AB (ref 70–99)

## 2016-08-22 LAB — POCT GLYCOSYLATED HEMOGLOBIN (HGB A1C): HEMOGLOBIN A1C: 9.7

## 2016-08-22 MED ORDER — TRUEPLUS LANCETS 28G MISC: 1.0000 | Freq: Three times a day (TID) | 12 refills | Status: DC

## 2016-08-22 MED ORDER — CARVEDILOL 12.5 MG PO TABS: 18.7500 mg | ORAL_TABLET | Freq: Two times a day (BID) | ORAL | 3 refills | Status: DC

## 2016-08-22 MED ORDER — GLUCOSE BLOOD VI STRP
ORAL_STRIP | 12 refills | Status: DC
Start: 2016-08-22 — End: 2016-09-06

## 2016-08-22 MED ORDER — METFORMIN HCL 1000 MG PO TABS: 1000.0000 mg | ORAL_TABLET | Freq: Two times a day (BID) | ORAL | 3 refills | Status: DC

## 2016-08-22 MED ORDER — HYDRALAZINE HCL 50 MG PO TABS: 50.0000 mg | ORAL_TABLET | Freq: Two times a day (BID) | ORAL | 3 refills | Status: DC

## 2016-08-22 MED ORDER — TRUE METRIX METER W/DEVICE KIT: 1.0000 | PACK | Freq: Three times a day (TID) | 0 refills | Status: DC

## 2016-08-22 MED ORDER — AMLODIPINE BESYLATE 5 MG PO TABS: 5.0000 mg | ORAL_TABLET | Freq: Every day | ORAL | 3 refills | Status: DC

## 2016-08-22 MED ORDER — ATORVASTATIN CALCIUM 80 MG PO TABS: 80.0000 mg | ORAL_TABLET | Freq: Every day | ORAL | 3 refills | Status: DC

## 2016-08-22 MED ORDER — INSULIN GLARGINE 100 UNIT/ML SOLOSTAR PEN: 30.0000 [IU] | PEN_INJECTOR | SUBCUTANEOUS | 3 refills | Status: DC

## 2016-08-22 MED ORDER — SPIRONOLACTONE 25 MG PO TABS: 25.0000 mg | ORAL_TABLET | Freq: Every day | ORAL | 3 refills | Status: DC

## 2016-08-22 MED ORDER — SOTALOL HCL 120 MG PO TABS: 120.0000 mg | ORAL_TABLET | Freq: Two times a day (BID) | ORAL | 3 refills | Status: DC

## 2016-08-22 NOTE — Progress Notes (Signed)
[name] [name], is a 61 y.o. male  ZOX:[id number]  [id number]:[id number]  DOB - October 28, 1955  Chief Complaint  Patient presents with  . Diabetes      Subjective:   [name] [name] is a 61 y.o. male with history of HTN, HLD, DM, PAF on Coumadin, CAD, h/o cholelithiasis/cholecystitis with biliary drain placed in October 2016, and h/o Osteosarcoma here today for a follow up visit for DM and HTN. Patient was recently discharged from the hospital, he was a direct admission from the cardiologist office on 08/03/2016 after he was seen for routine follow up but he complained of chest discomfort and cramping, his troponin returned 0.04. He did develop Afib with RVR on 12/22 with mild SOB. Given he had been on Coumadin with therapeutic INR he was started on sotalol 120 mg bid after risks and benefits were discussed. Potassium, Magnesium, and EKG were followed. Lytes remained at goal. QT was stable on sotalol. Ventricular rates were controlled. His SOB improved. He ambulated in the hallway with pulse ox remaining > 96% on room air. He was discharged home in a hemodynamically stable condition for close cardiology and PCP follow up. He needs refill of his medications today. He has no new symptom. He claims adherence with his medications. Today, he denies symptoms of palpitations, chest pain, shortness of breath, orthopnea, PND, lower extremity edema, dizziness, presyncope, syncope  He requests referral to dentist for dental caries.  No problems updated.  ALLERGIES: Allergies  Allergen Reactions  . Ace Inhibitors Cough  . Other Hives and Other (See Comments)    Patient reports developing hives after receiving "some antibiotic given in 1980''s [facility]". He does not know which antibiotic.    PAST MEDICAL HISTORY: Past Medical History:  Diagnosis Date  . Anginal pain ([place])   . Anxiety   . Arrhythmia   . CAD (coronary artery disease), native coronary artery    a. Nonobstructive CAD by cath  2013 - diffuse distal and branch vessel CAD, no severe disease in the major coronaries, LV mild global hypokinesis, EF 45%. b. ETT-Sestamibi 5/14: EF 31%, small fixed inferior defect with no ischemia.  . Cholecystitis   . Chronic CHF ([place])    a. Mixed ICM/NICM (?EtOH). EF 35% in 2008. Echo 5/13: EF 60-65%, mod LVH, EF 45% on V gram in 12/2011. EF 12/2012: EF 50-55%, mild LVH, inferobasal HK, mild MR. ETT-Ses 5/14 EF 41%. Cardiac MRI 5/14: EF 44%, mild global HK, subepicardial delayed enhancement in nonspecific RV insertion pattern.  . COLONIC POLYPS, HX OF 12/30/2006  . Gout   . H/O atrial flutter 2007   a. Ablations in 2007, 2008.  [name] Heart murmur   . HEPATITIS B, CHRONIC 12/30/2006  . History of alcohol abuse   . History of hiatal hernia   . History of medication noncompliance   . History of nuclear stress test    Myoview 3/17: + chest pain; EF 33%, downsloping ST depression 2, V4-6, LVH with strain, no ischemia on images; intermediate risk   . HYPERCHOLESTEROLEMIA 07/11/2010  . Hypertension   . Left sciatic nerve pain since 04/2015  . LIVER FUNCTION TESTS, ABNORMAL, HX OF 12/30/2006  . MITRAL REGURGITATION 12/30/2006  . 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 PAF (paroxysmal atrial fibrillation) (HCC)    On coumadin  . Rectal bleeding 12/18/2011   Scheduled for colonoscopy.    . Shortness of breath dyspnea    with excertion  . Sleep apnea    "  suppose to send mask but they never did" (05/03/2015)  . Type II diabetes mellitus ([place])     MEDICATIONS AT HOME: Prior to Admission medications   Medication Sig Start Date End Date Taking? Authorizing Provider  acetaminophen (TYLENOL) 500 MG tablet Take 1,000 mg by mouth every 6 (six) hours as needed for headache (pain). Reported on 08/18/2015   Yes Historical Provider, MD  amLODipine (NORVASC) 5 MG tablet Take 1 tablet (5 mg total) by mouth daily. 08/22/16  Yes Tresa Garter, MD  aspirin EC 81 MG tablet Take 1 tablet (81 mg total)  by mouth daily. DO NOT START UNTIL YOU FINISH PLAVIX Patient taking differently: Take 81 mg by mouth daily.  05/08/16  Yes Larey Dresser, MD  atorvastatin (LIPITOR) 80 MG tablet Take 1 tablet (80 mg total) by mouth daily. 08/22/16  Yes Tresa Garter, MD  carvedilol (COREG) 12.5 MG tablet Take 1.5 tablets (18.75 mg total) by mouth 2 (two) times daily with a meal. 08/22/16  Yes Corina Stacy E Doreene Burke, MD  furosemide (LASIX) 20 MG tablet Take 1 tablet (20 mg total) by mouth daily. Take 2 tablets in the morning and 1 in the evening. Patient taking differently: Take 40 mg by mouth 2 (two) times daily. Take 2 tablets in the morning and 1 in the evening 06/14/16  Yes Larey Dresser, MD  hydrALAZINE (APRESOLINE) 50 MG tablet Take 1 tablet (50 mg total) by mouth 2 (two) times daily. Take 50 mg by mouth 2 (two) times daily. 08/22/16  Yes Tresa Garter, MD  Insulin Glargine (LANTUS SOLOSTAR) 100 UNIT/ML Solostar Pen Inject 30 Units into the skin every morning. And pen needles 1/day 08/22/16  Yes Tresa Garter, MD  metFORMIN (GLUCOPHAGE) 1000 MG tablet Take 1 tablet (1,000 mg total) by mouth 2 (two) times daily with a meal. 08/22/16  Yes Marcianne Ozbun E Doreene Burke, MD  nitroGLYCERIN (NITROSTAT) 0.4 MG SL tablet Place 1 tablet (0.4 mg total) under the tongue every 5 (five) minutes as needed for chest pain (x 3 tabs daily). Reported on 08/18/2015 02/29/16  Yes Larey Dresser, MD  potassium chloride SA (K-DUR,KLOR-CON) 20 MEQ tablet Take 1 tablet (20 mEq total) by mouth daily. 08/10/16  Yes Sherran Needs, NP  sotalol (BETAPACE) 120 MG tablet Take 1 tablet (120 mg total) by mouth every 12 (twelve) hours. 08/22/16  Yes Tresa Garter, MD  spironolactone (ALDACTONE) 25 MG tablet Take 1 tablet (25 mg total) by mouth at bedtime. 08/22/16  Yes Tresa Garter, MD  warfarin (COUMADIN) 5 MG tablet TAKE AS DIRECTED BY COUMADIN CLINIC Patient taking differently: TAKE 1/2 TABLET ON TUES, THURS, SAT AT BEDTIME, TAKE 1  TABLET ON SUN, MON, WED, FRI AT BEDTIME OR AS DIRECTED BY COUMADIN CLINIC 05/25/16  Yes Larey Dresser, MD  Blood Glucose Monitoring Suppl (TRUE METRIX METER) w/Device KIT 1 each by Does not apply route 3 (three) times daily. 08/22/16   Tresa Garter, MD  glucose blood (TRUE METRIX BLOOD GLUCOSE TEST) test strip Use as instructed 08/22/16   Tresa Garter, MD  TRUEPLUS LANCETS 28G MISC 1 each by Does not apply route 3 (three) times daily. 08/22/16   Tresa Garter, MD    Objective:   Vitals:   08/22/16 1350  BP: 135/70  Pulse: 75  Resp: 18  Temp: 98.5 F (36.9 C)  TempSrc: Oral  SpO2: 98%  Weight: 211 lb 6.4 oz (95.9 kg)  Height: '5\' 11"'  (1.803  m)   Exam General appearance : Awake, alert, not in any distress. Speech Clear. Not toxic looking, poor dentition HEENT: Atraumatic and Normocephalic, pupils equally reactive to light and accomodation Neck: Supple, no JVD. No cervical lymphadenopathy.  Chest: Good air entry bilaterally, no added sounds  CVS: S1 S2 regular, no murmurs.  Abdomen: Bowel sounds present, Non tender and not distended with no gaurding, rigidity or rebound. Extremities: B/L Lower Ext shows no edema, both legs are warm to touch Neurology: Awake alert, and oriented X 3, CN II-XII intact, Non focal Skin: No Rash  Data Review Lab Results  Component Value Date   HGBA1C 9.7 08/22/2016   HGBA1C 7.6 01/26/2016   HGBA1C 8.0 11/10/2015    Assessment & Plan   1. Type 2 diabetes mellitus without complication, with long-term current use of insulin (HCC)  - Glucose (CBG) - POCT A1C has gone up to 9.7%  - Ambulatory referral to Ophthalmology for routine diabetic eye exam Refill - Insulin Glargine (LANTUS SOLOSTAR) 100 UNIT/ML Solostar Pen; Inject 30 Units into the skin every morning. And pen needles 1/day  Dispense: 30 mL; Refill: 3 - metFORMIN (GLUCOPHAGE) 1000 MG tablet; Take 1 tablet (1,000 mg total) by mouth 2 (two) times daily with a meal.   Dispense: 180 tablet; Refill: 3  2. Essential hypertension: Controlled Refill - amLODipine (NORVASC) 5 MG tablet; Take 1 tablet (5 mg total) by mouth daily.  Dispense: 90 tablet; Refill: 3 - hydrALAZINE (APRESOLINE) 50 MG tablet; Take 1 tablet (50 mg total) by mouth 2 (two) times daily. Take 50 mg by mouth 2 (two) times daily.  Dispense: 180 tablet; Refill: 3  3. Dyslipidemia Refill - atorvastatin (LIPITOR) 80 MG tablet; Take 1 tablet (80 mg total) by mouth daily.  Dispense: 90 tablet; Refill: 3  4. Dental caries  - Ambulatory referral to Dentistry  5. Coronary artery disease involving native coronary artery of native heart with unstable angina pectoris (HCC) Refill - carvedilol (COREG) 12.5 MG tablet; Take 1.5 tablets (18.75 mg total) by mouth 2 (two) times daily with a meal.  Dispense: 180 tablet; Refill: 3 - spironolactone (ALDACTONE) 25 MG tablet; Take 1 tablet (25 mg total) by mouth at bedtime.  Dispense: 90 tablet; Refill: 3  6. Persistent atrial fibrillation (HCC)  - sotalol (BETAPACE) 120 MG tablet; Take 1 tablet (120 mg total) by mouth every 12 (twelve) hours.  Dispense: 180 tablet; Refill: 3  Patient have been counseled extensively about nutrition and exercise. Other issues discussed during this visit include: low cholesterol diet, weight control and daily exercise, foot care, annual eye examinations at Ophthalmology, importance of adherence with medications and regular follow-up. We also discussed long term complications of uncontrolled diabetes and hypertension.   Return in about 3 months (around 11/20/2016) for Hemoglobin A1C and Follow up, DM, Heart Failure and Hypertension.  The patient was given clear instructions to go to ER or return to medical center if symptoms don't improve, worsen or new problems develop. The patient verbalized understanding. The patient was told to call to get lab results if they haven't heard anything in the next week.   This note has been created  with Surveyor, quantity. Any transcriptional errors are unintentional.    Angelica Chessman, MD, [place], [place], [place], [place] and [place], [place]   08/22/2016, 2:48 PM

## 2016-08-22 NOTE — Progress Notes (Signed)
Patient is here for DM  Patient denies pain at this time.  Patient has taken medication Patient has eaten today.  Patient states he has not drank in 1 month.

## 2016-08-22 NOTE — Patient Instructions (Signed)
DASH Eating Plan DASH stands for "Dietary Approaches to Stop Hypertension." The DASH eating plan is a healthy eating plan that has been shown to reduce high blood pressure (hypertension). Additional health benefits may include reducing the risk of type 2 diabetes mellitus, heart disease, and stroke. The DASH eating plan may also help with weight loss. What do I need to know about the DASH eating plan? For the DASH eating plan, you will follow these general guidelines:  Choose foods with less than 150 milligrams of sodium per serving (as listed on the food label).  Use salt-free seasonings or herbs instead of table salt or sea salt.  Check with your health care provider or pharmacist before using salt substitutes.  Eat lower-sodium products. These are often labeled as "low-sodium" or "no salt added."  Eat fresh foods. Avoid eating a lot of canned foods.  Eat more vegetables, fruits, and low-fat dairy products.  Choose whole grains. Look for the word "whole" as the first word in the ingredient list.  Choose fish and skinless chicken or turkey more often than red meat. Limit fish, poultry, and meat to 6 oz (170 g) each day.  Limit sweets, desserts, sugars, and sugary drinks.  Choose heart-healthy fats.  Eat more home-cooked food and less restaurant, buffet, and fast food.  Limit fried foods.  Do not fry foods. Cook foods using methods such as baking, boiling, grilling, and broiling instead.  When eating at a restaurant, ask that your food be prepared with less salt, or no salt if possible. What foods can I eat? Seek help from a dietitian for individual calorie needs. Grains  Whole grain or whole wheat bread. Brown rice. Whole grain or whole wheat pasta. Quinoa, bulgur, and whole grain cereals. Low-sodium cereals. Corn or whole wheat flour tortillas. Whole grain cornbread. Whole grain crackers. Low-sodium crackers. Vegetables  Fresh or frozen vegetables (raw, steamed, roasted, or  grilled). Low-sodium or reduced-sodium tomato and vegetable juices. Low-sodium or reduced-sodium tomato sauce and paste. Low-sodium or reduced-sodium canned vegetables. Fruits  All fresh, canned (in natural juice), or frozen fruits. Meat and Other Protein Products  Ground beef (85% or leaner), grass-fed beef, or beef trimmed of fat. Skinless chicken or turkey. Ground chicken or turkey. Pork trimmed of fat. All fish and seafood. Eggs. Dried beans, peas, or lentils. Unsalted nuts and seeds. Unsalted canned beans. Dairy  Low-fat dairy products, such as skim or 1% milk, 2% or reduced-fat cheeses, low-fat ricotta or cottage cheese, or plain low-fat yogurt. Low-sodium or reduced-sodium cheeses. Fats and Oils  Tub margarines without trans fats. Light or reduced-fat mayonnaise and salad dressings (reduced sodium). Avocado. Safflower, olive, or canola oils. Natural peanut or almond butter. Other  Unsalted popcorn and pretzels. The items listed above may not be a complete list of recommended foods or beverages. Contact your dietitian for more options.  What foods are not recommended? Grains  White bread. White pasta. White rice. Refined cornbread. Bagels and croissants. Crackers that contain trans fat. Vegetables  Creamed or fried vegetables. Vegetables in a cheese sauce. Regular canned vegetables. Regular canned tomato sauce and paste. Regular tomato and vegetable juices. Fruits  Canned fruit in light or heavy syrup. Fruit juice. Meat and Other Protein Products  Fatty cuts of meat. Ribs, chicken wings, bacon, sausage, bologna, salami, chitterlings, fatback, hot dogs, bratwurst, and packaged luncheon meats. Salted nuts and seeds. Canned beans with salt. Dairy  Whole or 2% milk, cream, half-and-half, and cream cheese. Whole-fat or sweetened yogurt. Full-fat cheeses   or blue cheese. Nondairy creamers and whipped toppings. Processed cheese, cheese spreads, or cheese curds. Condiments  Onion and garlic  salt, seasoned salt, table salt, and sea salt. Canned and packaged gravies. Worcestershire sauce. Tartar sauce. Barbecue sauce. Teriyaki sauce. Soy sauce, including reduced sodium. Steak sauce. Fish sauce. Oyster sauce. Cocktail sauce. Horseradish. Ketchup and mustard. Meat flavorings and tenderizers. Bouillon cubes. Hot sauce. Tabasco sauce. Marinades. Taco seasonings. Relishes. Fats and Oils  Butter, stick margarine, lard, shortening, ghee, and bacon fat. Coconut, palm kernel, or palm oils. Regular salad dressings. Other  Pickles and olives. Salted popcorn and pretzels. The items listed above may not be a complete list of foods and beverages to avoid. Contact your dietitian for more information.  Where can I find more information? National [employer], [employer], [employer]: www.nhlbi.nih.gov/health/health-topics/topics/dash/ This information is not intended to replace advice given to you by your health care provider. Make sure you discuss any questions you have with your health care provider. Document Released: 07/19/2011 Document Revised: 01/05/2016 Document Reviewed: 06/03/2013 Elsevier Interactive Patient Education  2017 [employer] Hypertension Hypertension, commonly called high blood pressure, is when the force of blood pumping through your arteries is too strong. Your arteries are the blood vessels that carry blood from your heart throughout your body. A blood pressure reading consists of a higher number over a lower number, such as 110/72. The higher number (systolic) is the pressure inside your arteries when your heart pumps. The lower number (diastolic) is the pressure inside your arteries when your heart relaxes. Ideally you want your blood pressure below 120/80. Hypertension forces your heart to work harder to pump blood. Your arteries may become narrow or stiff. Having untreated or uncontrolled hypertension can cause heart attack, stroke, kidney disease, and other problems. What  increases the risk? Some risk factors for high blood pressure are controllable. Others are not. Risk factors you cannot control include:  Race. You may be at higher risk if you are African American.  Age. Risk increases with age.  Gender. Men are at higher risk than women before age 45 years. After age 65, women are at higher risk than men. Risk factors you can control include:  Not getting enough exercise or physical activity.  Being overweight.  Getting too much fat, sugar, calories, or salt in your diet.  Drinking too much alcohol. What are the signs or symptoms? Hypertension does not usually cause signs or symptoms. Extremely high blood pressure (hypertensive crisis) may cause headache, anxiety, shortness of breath, and nosebleed. How is this diagnosed? To check if you have hypertension, your health care provider will measure your blood pressure while you are seated, with your arm held at the level of your heart. It should be measured at least twice using the same arm. Certain conditions can cause a difference in blood pressure between your right and left arms. A blood pressure reading that is higher than normal on one occasion does not mean that you need treatment. If it is not clear whether you have high blood pressure, you may be asked to return on a different day to have your blood pressure checked again. Or, you may be asked to monitor your blood pressure at home for 1 or more weeks. How is this treated? Treating high blood pressure includes making lifestyle changes and possibly taking medicine. Living a healthy lifestyle can help lower high blood pressure. You may need to change some of your habits. Lifestyle changes may include:  Following the DASH diet. This   diet is high in fruits, vegetables, and whole grains. It is low in salt, red meat, and added sugars.  Keep your sodium intake below 2,300 mg per day.  Getting at least 30-45 minutes of aerobic exercise at least 4 times  per week.  Losing weight if necessary.  Not smoking.  Limiting alcoholic beverages.  Learning ways to reduce stress. Your health care provider may prescribe medicine if lifestyle changes are not enough to get your blood pressure under control, and if one of the following is true:  You are 18-59 years of age and your systolic blood pressure is above 140.  You are 60 years of age or older, and your systolic blood pressure is above 150.  Your diastolic blood pressure is above 90.  You have diabetes, and your systolic blood pressure is over 140 or your diastolic blood pressure is over 90.  You have kidney disease and your blood pressure is above 140/90.  You have heart disease and your blood pressure is above 140/90. Your personal target blood pressure may vary depending on your medical conditions, your age, and other factors. Follow these instructions at home:  Have your blood pressure rechecked as directed by your health care provider.  Take medicines only as directed by your health care provider. Follow the directions carefully. Blood pressure medicines must be taken as prescribed. The medicine does not work as well when you skip doses. Skipping doses also puts you at risk for problems.  Do not smoke.  Monitor your blood pressure at home as directed by your health care provider. Contact a health care provider if:  You think you are having a reaction to medicines taken.  You have recurrent headaches or feel dizzy.  You have swelling in your ankles.  You have trouble with your vision. Get help right away if:  You develop a severe headache or confusion.  You have unusual weakness, numbness, or feel faint.  You have severe chest or abdominal pain.  You vomit repeatedly.  You have trouble breathing. This information is not intended to replace advice given to you by your health care provider. Make sure you discuss any questions you have with your health care  provider. Document Released: 07/30/2005 Document Revised: 01/05/2016 Document Reviewed: 05/22/2013 Elsevier Interactive Patient Education  2017 [employer] Diabetes and Foot Care Diabetes may cause you to have problems because of poor blood supply (circulation) to your feet and legs. This may cause the skin on your feet to become thinner, break easier, and heal more slowly. Your skin may become dry, and the skin may peel and crack. You may also have nerve damage in your legs and feet causing decreased feeling in them. You may not notice minor injuries to your feet that could lead to infections or more serious problems. Taking care of your feet is one of the most important things you can do for yourself. Follow these instructions at home:  Wear shoes at all times, even in the house. Do not go barefoot. Bare feet are easily injured.  Check your feet daily for blisters, cuts, and redness. If you cannot see the bottom of your feet, use a mirror or ask someone for help.  Wash your feet with warm water (do not use hot water) and mild soap. Then pat your feet and the areas between your toes until they are completely dry. Do not soak your feet as this can dry your skin.  Apply a moisturizing lotion or petroleum jelly (that does   not contain alcohol and is unscented) to the skin on your feet and to dry, brittle toenails. Do not apply lotion between your toes.  Trim your toenails straight across. Do not dig under them or around the cuticle. File the edges of your nails with an emery board or nail file.  Do not cut corns or calluses or try to remove them with medicine.  Wear clean socks or stockings every day. Make sure they are not too tight. Do not wear knee-high stockings since they may decrease blood flow to your legs.  Wear shoes that fit properly and have enough cushioning. To break in new shoes, wear them for just a few hours a day. This prevents you from injuring your feet. Always look in your  shoes before you put them on to be sure there are no objects inside.  Do not cross your legs. This may decrease the blood flow to your feet.  If you find a minor scrape, cut, or break in the skin on your feet, keep it and the skin around it clean and dry. These areas may be cleansed with mild soap and water. Do not cleanse the area with peroxide, alcohol, or iodine.  When you remove an adhesive bandage, be sure not to damage the skin around it.  If you have a wound, look at it several times a day to make sure it is healing.  Do not use heating pads or hot water bottles. They may burn your skin. If you have lost feeling in your feet or legs, you may not know it is happening until it is too late.  Make sure your health care provider performs a complete foot exam at least annually or more often if you have foot problems. Report any cuts, sores, or bruises to your health care provider immediately. Contact a health care provider if:  You have an injury that is not healing.  You have cuts or breaks in the skin.  You have an ingrown nail.  You notice redness on your legs or feet.  You feel burning or tingling in your legs or feet.  You have pain or cramps in your legs and feet.  Your legs or feet are numb.  Your feet always feel cold. Get help right away if:  There is increasing redness, swelling, or pain in or around a wound.  There is a red line that goes up your leg.  Pus is coming from a wound.  You develop a fever or as directed by your health care provider.  You notice a bad smell coming from an ulcer or wound. This information is not intended to replace advice given to you by your health care provider. Make sure you discuss any questions you have with your health care provider. Document Released: 07/27/2000 Document Revised: 01/05/2016 Document Reviewed: 01/06/2013 Elsevier Interactive Patient Education  2017 [employer]. Blood Glucose Monitoring, Adult Monitoring your  blood sugar (glucose) helps you manage your diabetes. It also helps you and your health care provider determine how well your diabetes management plan is working. Blood glucose monitoring involves checking your blood glucose as often as directed, and keeping a record (log) of your results over time. Why should I monitor my blood glucose? Checking your blood glucose regularly can:  Help you understand how food, exercise, illnesses, and medicines affect your blood glucose.  Let you know what your blood glucose is at any time. You can quickly tell if you are having low blood glucose (hypoglycemia)   or high blood glucose (hyperglycemia).  Help you and your health care provider adjust your medicines as needed. When should I check my blood glucose? Follow instructions from your health care provider about how often to check your blood glucose. This may depend on:  The type of diabetes you have.  How well-controlled your diabetes is.  Medicines you are taking. If you have type 1 diabetes:  Check your blood glucose at least 2 times a day.  Also check your blood glucose:  Before every insulin injection.  Before and after exercise.  Between meals.  2 hours after a meal.  Occasionally between 2:00 a.m. and 3:00 a.m., as directed.  Before potentially dangerous tasks, like driving or using heavy machinery.  At bedtime.  You may need to check your blood glucose more often, up to 6-10 times a day:  If you use an insulin pump.  If you need multiple daily injections (MDI).  If your diabetes is not well-controlled.  If you are ill.  If you have a history of severe hypoglycemia.  If you have a history of not knowing when your blood glucose is getting low (hypoglycemia unawareness). If you have type 2 diabetes:  If you take insulin or other diabetes medicines, check your blood glucose at least 2 times a day.  If you are on intensive insulin therapy, check your blood glucose at least 4  times a day. Occasionally, you may also need to check between 2:00 a.m. and 3:00 a.m., as directed.  Also check your blood glucose:  Before and after exercise.  Before potentially dangerous tasks, like driving or using heavy machinery.  You may need to check your blood glucose more often if:  Your medicine is being adjusted.  Your diabetes is not well-controlled.  You are ill. What is a blood glucose log?  A blood glucose log is a record of your blood glucose readings. It helps you and your health care provider:  Look for patterns in your blood glucose over time.  Adjust your diabetes management plan as needed.  Every time you check your blood glucose, write down your result and notes about things that may be affecting your blood glucose, such as your diet and exercise for the day.  Most glucose meters store a record of glucose readings in the meter. Some meters allow you to download your records to a computer. How do I check my blood glucose? Follow these steps to get accurate readings of your blood glucose: Supplies needed   Blood glucose meter.  Test strips for your meter. Each meter has its own strips. You must use the strips that come with your meter.  A needle to prick your finger (lancet). Do not use lancets more than once.  A device that holds the lancet (lancing device).  A journal or log book to write down your results. Procedure  Wash your hands with soap and water.  Prick the side of your finger (not the tip) with the lancet. Use a different finger each time.  Gently rub the finger until a small drop of blood appears.  Follow instructions that come with your meter for inserting the test strip, applying blood to the strip, and using your blood glucose meter.  Write down your result and any notes. Alternative testing sites  Some meters allow you to use areas of your body other than your finger (alternative sites) to test your blood.  If you think you  may have hypoglycemia, or if you have   hypoglycemia unawareness, do not use alternative sites. Use your finger instead.  Alternative sites may not be as accurate as the fingers, because blood flow is slower in these areas. This means that the result you get may be delayed, and it may be different from the result that you would get from your finger.  The most common alternative sites are:  Forearm.  Thigh.  Palm of the hand. Additional tips  Always keep your supplies with you.  If you have questions or need help, all blood glucose meters have a 24-hour "hotline" number that you can call. You may also contact your health care provider.  After you use a few boxes of test strips, adjust (calibrate) your blood glucose meter by following instructions that came with your meter. This information is not intended to replace advice given to you by your health care provider. Make sure you discuss any questions you have with your health care provider. Document Released: 08/02/2003 Document Revised: 02/17/2016 Document Reviewed: 01/09/2016 Elsevier Interactive Patient Education  2017 [employer] Inc.  

## 2016-08-23 ENCOUNTER — Encounter: Payer: Self-pay | Admitting: Internal Medicine

## 2016-08-24 ENCOUNTER — Other Ambulatory Visit: Payer: Medicare Other

## 2016-08-30 ENCOUNTER — Other Ambulatory Visit: Payer: Medicare Other

## 2016-09-05 ENCOUNTER — Ambulatory Visit: Payer: Medicare Other | Admitting: Pharmacist

## 2016-09-06 ENCOUNTER — Ambulatory Visit: Payer: Medicare Other | Attending: Internal Medicine | Admitting: Pharmacist

## 2016-09-06 DIAGNOSIS — E119 Type 2 diabetes mellitus without complications: Secondary | ICD-10-CM | POA: Diagnosis not present

## 2016-09-06 DIAGNOSIS — Z7984 Long term (current) use of oral hypoglycemic drugs: Secondary | ICD-10-CM | POA: Insufficient documentation

## 2016-09-06 DIAGNOSIS — E1165 Type 2 diabetes mellitus with hyperglycemia: Secondary | ICD-10-CM | POA: Diagnosis not present

## 2016-09-06 DIAGNOSIS — Z794 Long term (current) use of insulin: Secondary | ICD-10-CM | POA: Diagnosis not present

## 2016-09-06 DIAGNOSIS — I25119 Atherosclerotic heart disease of native coronary artery with unspecified angina pectoris: Secondary | ICD-10-CM | POA: Diagnosis not present

## 2016-09-06 LAB — POCT CBG (FASTING - GLUCOSE)-MANUAL ENTRY: Glucose Fasting, POC: 192 mg/dL — AB (ref 70–99)

## 2016-09-06 MED ORDER — ACCU-CHEK SOFTCLIX LANCETS MISC: 12 refills | Status: DC

## 2016-09-06 MED ORDER — ACCU-CHEK SOFTCLIX LANCET DEV MISC: 0 refills | Status: DC

## 2016-09-06 MED ORDER — ACCU-CHEK AVIVA PLUS W/DEVICE KIT: PACK | 0 refills | Status: DC

## 2016-09-06 MED ORDER — GLUCOSE BLOOD VI STRP: ORAL_STRIP | 12 refills | Status: DC

## 2016-09-06 NOTE — Patient Instructions (Signed)
Thanks for coming to see [place]!  Pick up your meter from [facility] - if it isn't covered, let us know and I will switch it to one covered by insurance  Check your blood sugar every morning before you eat  Come back in 2 weeks with your meter   Blood Glucose Monitoring, Adult Monitoring your blood sugar (glucose) helps you manage your diabetes. It also helps you and your health care provider determine how well your diabetes management plan is working. Blood glucose monitoring involves checking your blood glucose as often as directed, and keeping a record (log) of your results over time. Why should I monitor my blood glucose? Checking your blood glucose regularly can:  Help you understand how food, exercise, illnesses, and medicines affect your blood glucose.  Let you know what your blood glucose is at any time. You can quickly tell if you are having low blood glucose (hypoglycemia) or high blood glucose (hyperglycemia).  Help you and your health care provider adjust your medicines as needed. When should I check my blood glucose? Follow instructions from your health care provider about how often to check your blood glucose. This may depend on:  The type of diabetes you have.  How well-controlled your diabetes is.  Medicines you are taking. If you have type 1 diabetes:  Check your blood glucose at least 2 times a day.  Also check your blood glucose:  Before every insulin injection.  Before and after exercise.  Between meals.  2 hours after a meal.  Occasionally between 2:00 a.m. and 3:00 a.m., as directed.  Before potentially dangerous tasks, like driving or using heavy machinery.  At bedtime.  You may need to check your blood glucose more often, up to 6-10 times a day:  If you use an insulin pump.  If you need multiple daily injections (MDI).  If your diabetes is not well-controlled.  If you are ill.  If you have a history of severe hypoglycemia.  If you have a  history of not knowing when your blood glucose is getting low (hypoglycemia unawareness). If you have type 2 diabetes:  If you take insulin or other diabetes medicines, check your blood glucose at least 2 times a day.  If you are on intensive insulin therapy, check your blood glucose at least 4 times a day. Occasionally, you may also need to check between 2:00 a.m. and 3:00 a.m., as directed.  Also check your blood glucose:  Before and after exercise.  Before potentially dangerous tasks, like driving or using heavy machinery.  You may need to check your blood glucose more often if:  Your medicine is being adjusted.  Your diabetes is not well-controlled.  You are ill. What is a blood glucose log?  A blood glucose log is a record of your blood glucose readings. It helps you and your health care provider:  Look for patterns in your blood glucose over time.  Adjust your diabetes management plan as needed.  Every time you check your blood glucose, write down your result and notes about things that may be affecting your blood glucose, such as your diet and exercise for the day.  Most glucose meters store a record of glucose readings in the meter. Some meters allow you to download your records to a computer. How do I check my blood glucose? Follow these steps to get accurate readings of your blood glucose: Supplies needed   Blood glucose meter.  Test strips for your meter. Each meter has  its own strips. You must use the strips that come with your meter.  A needle to prick your finger (lancet). Do not use lancets more than once.  A device that holds the lancet (lancing device).  A journal or log book to write down your results. Procedure  Wash your hands with soap and water.  Prick the side of your finger (not the tip) with the lancet. Use a different finger each time.  Gently rub the finger until a small drop of blood appears.  Follow instructions that come with your  meter for inserting the test strip, applying blood to the strip, and using your blood glucose meter.  Write down your result and any notes. Alternative testing sites  Some meters allow you to use areas of your body other than your finger (alternative sites) to test your blood.  If you think you may have hypoglycemia, or if you have hypoglycemia unawareness, do not use alternative sites. Use your finger instead.  Alternative sites may not be as accurate as the fingers, because blood flow is slower in these areas. This means that the result you get may be delayed, and it may be different from the result that you would get from your finger.  The most common alternative sites are:  Forearm.  Thigh.  Palm of the hand. Additional tips  Always keep your supplies with you.  If you have questions or need help, all blood glucose meters have a 24-hour "hotline" number that you can call. You may also contact your health care provider.  After you use a few boxes of test strips, adjust (calibrate) your blood glucose meter by following instructions that came with your meter. This information is not intended to replace advice given to you by your health care provider. Make sure you discuss any questions you have with your health care provider. Document Released: 08/02/2003 Document Revised: 02/17/2016 Document Reviewed: 01/09/2016 Elsevier Interactive Patient Education  2017 [employer].

## 2016-09-06 NOTE — Progress Notes (Signed)
    S:    Patient arrives in good spirits.  Presents for diabetes evaluation, education, and management at the request of Dr. Doreene Burke. Patient was referred on 08/22/16.  Patient was last seen by Primary Care Provider on 08/22/16.   Patient reports adherence with medications.  Current diabetes medications include: Lantus 30 units daily and metformin 1000 mg BID.  Patient denies hypoglycemic events.  Patient reports that he doesn't have a meter at home and that he was unable to get one filled at [facility] so he has not been checking his blood sugars at home.  Patient reported dietary habits: doesn't follow a low-carb diet  Patient reported exercise habits: none   Patient reports nocturia.  Patient reports neuropathy. Patient denies visual changes. Patient reports self foot exams.   Patient was recently discharged from hospital and all medications have been reviewed.  O:  Lab Results  Component Value Date   HGBA1C 9.7 08/22/2016   There were no vitals filed for this visit.  Home fasting CBG: does not check 2 hour post-prandial/random CBG: does not check  POCT glucose = 192   A/P: Diabetes longstanding currently Uncontrolled based on A1c of 9.7. Patient denies hypoglycemic events and is able to verbalize appropriate hypoglycemia management plan. Patient reports adherence with medication. Control is suboptimal due to dietary indiscretion and sedentary lifestyle.  Continue Lantus 30 units daily and metformin 1000 mg BID. Ordered patient blood glucose meter supplies that should be covered under Medicare. Patient to start checking fastings to help guide medication dose changes.   Next A1C anticipated April 2018.    Written patient instructions provided.  Total time in face to face counseling 20 minutes.   Follow up in Pharmacist Clinic Visit in 2 weeks.   Patient seen with Windell Moulding, PharmD Candidate

## 2016-09-10 ENCOUNTER — Encounter (HOSPITAL_COMMUNITY): Payer: Self-pay | Admitting: Nurse Practitioner

## 2016-09-10 ENCOUNTER — Other Ambulatory Visit (HOSPITAL_COMMUNITY): Payer: Self-pay | Admitting: *Deleted

## 2016-09-10 ENCOUNTER — Ambulatory Visit (HOSPITAL_COMMUNITY)
Admission: RE | Admit: 2016-09-10 | Discharge: 2016-09-10 | Disposition: A | Discharge status: Home / Self Care | Payer: Medicare Other | Source: Ambulatory Visit | Attending: Nurse Practitioner | Admitting: Nurse Practitioner

## 2016-09-10 VITALS — BP 128/70 | HR 79 | Ht 71.0 in | Wt 212.4 lb

## 2016-09-10 DIAGNOSIS — Z7901 Long term (current) use of anticoagulants: Secondary | ICD-10-CM | POA: Insufficient documentation

## 2016-09-10 DIAGNOSIS — Z87891 Personal history of nicotine dependence: Secondary | ICD-10-CM | POA: Diagnosis not present

## 2016-09-10 DIAGNOSIS — N181 Chronic kidney disease, stage 1: Secondary | ICD-10-CM | POA: Diagnosis not present

## 2016-09-10 DIAGNOSIS — I509 Heart failure, unspecified: Secondary | ICD-10-CM | POA: Insufficient documentation

## 2016-09-10 DIAGNOSIS — I480 Paroxysmal atrial fibrillation: Secondary | ICD-10-CM | POA: Insufficient documentation

## 2016-09-10 DIAGNOSIS — Z794 Long term (current) use of insulin: Secondary | ICD-10-CM | POA: Diagnosis not present

## 2016-09-10 DIAGNOSIS — E1122 Type 2 diabetes mellitus with diabetic chronic kidney disease: Secondary | ICD-10-CM | POA: Diagnosis not present

## 2016-09-10 DIAGNOSIS — G4733 Obstructive sleep apnea (adult) (pediatric): Secondary | ICD-10-CM | POA: Insufficient documentation

## 2016-09-10 DIAGNOSIS — R9431 Abnormal electrocardiogram [ECG] [EKG]: Secondary | ICD-10-CM | POA: Diagnosis not present

## 2016-09-10 DIAGNOSIS — I2510 Atherosclerotic heart disease of native coronary artery without angina pectoris: Secondary | ICD-10-CM | POA: Diagnosis not present

## 2016-09-10 DIAGNOSIS — I4891 Unspecified atrial fibrillation: Secondary | ICD-10-CM | POA: Diagnosis present

## 2016-09-10 DIAGNOSIS — Z79899 Other long term (current) drug therapy: Secondary | ICD-10-CM | POA: Diagnosis not present

## 2016-09-10 DIAGNOSIS — Z7982 Long term (current) use of aspirin: Secondary | ICD-10-CM | POA: Insufficient documentation

## 2016-09-10 DIAGNOSIS — I130 Hypertensive heart and chronic kidney disease with heart failure and stage 1 through stage 4 chronic kidney disease, or unspecified chronic kidney disease: Secondary | ICD-10-CM | POA: Insufficient documentation

## 2016-09-10 LAB — BASIC METABOLIC PANEL
Anion gap: 9 (ref 5–15)
BUN: 14 mg/dL (ref 6–20)
CO2: 26 mmol/L (ref 22–32)
Calcium: 9.8 mg/dL (ref 8.9–10.3)
Chloride: 101 mmol/L (ref 101–111)
Creatinine, Ser: 1.25 mg/dL — ABNORMAL HIGH (ref 0.61–1.24)
GFR calc Af Amer: >60 mL/min (ref >60)
GFR calc non Af Amer: >60 mL/min (ref >60)
Glucose, Bld: 246 mg/dL — ABNORMAL HIGH (ref 65–99)
POTASSIUM: 3.9 mmol/L (ref 3.5–5.1)
SODIUM: 136 mmol/L (ref 135–145)

## 2016-09-10 LAB — MAGNESIUM: MAGNESIUM: 1.7 mg/dL (ref 1.7–2.4)

## 2016-09-10 MED ORDER — POTASSIUM CHLORIDE CRYS ER 10 MEQ PO TBCR: 10.0000 meq | EXTENDED_RELEASE_TABLET | Freq: Every day | ORAL | 6 refills | Status: DC

## 2016-09-10 MED ORDER — MAGNESIUM 200 MG PO TABS: 200.0000 mg | ORAL_TABLET | Freq: Every day | ORAL | Status: DC

## 2016-09-10 NOTE — Progress Notes (Signed)
Primary Care Physician: Angelica Chessman, MD Referring Physician:[facility] f/u Cardiologist: Dr. Delorise Jackson [name] [name] is a 61 y.o. male with a h/o 61 year old male with history of CAD s/p DES x 2 in the distal RCA in 2016, PAF/flutter on Coumadin, chronic diastolic CHF/cardiomyopathy suspected mixed NICM with EtOH abuse and ICM, DM, CKD stage I, HTN, and HLD who was admitted to [facility] on 12/22 after noting > 12 hours of exertional chest pain in the office and was found to have a mildly elevated troponin at 0.04.  Upon the patient's arrival to [facility] his troponin was flat trending at 0.04-->0.03-->0.04-->0.03. He remained chest pain free. Given he remained chest pain free, did not have significant elevation in his troponin, and his recent LHC above in 10/2015 medical management was advised. It was felt he would not require stress testing given LHC  previously done in 3/17 when medical management was advised.  He did develop Afib with RVR on 12/22 with mild SOB. Given he had been on Coumadin with therapeutic INR he was started on sotalol 120 mg bid after risks and benefits were discussed. Potassium, Magnesium, and EKG were followed. Lytes remained at goal. QT was stable on sotalol. Ventricular rates were controlled. His SOB improved. He ambulated in the hallway with pulse ox remaining > 96% on room air.  He left prior to full loading of sotalol due to wanting to get home for the holidays.  In the clinic today, he is in [place] with stable qtc. He feels well and states compliance with sotalol.  F/u in afib clinic 1/29 and he feels well. He has been staying in [place]. Has been compliant with sotalol and has not had any breakthrough afib. Ekg shows SR with stable qtc.  Today, he denies symptoms of palpitations, chest pain, shortness of breath, orthopnea, PND, lower extremity edema, dizziness, presyncope, syncope, or neurologic sequela. The patient is tolerating medications without difficulties and is otherwise  without complaint today.   Past Medical History:  Diagnosis Date  . Anginal pain ([place])   . Anxiety   . Arrhythmia   . CAD (coronary artery disease), native coronary artery    a. Nonobstructive CAD by cath 2013 - diffuse distal and branch vessel CAD, no severe disease in the major coronaries, LV mild global hypokinesis, EF 45%. b. ETT-Sestamibi 5/14: EF 31%, small fixed inferior defect with no ischemia.  . Cholecystitis   . Chronic CHF ([place])    a. Mixed ICM/NICM (?EtOH). EF 35% in 2008. Echo 5/13: EF 60-65%, mod LVH, EF 45% on V gram in 12/2011. EF 12/2012: EF 50-55%, mild LVH, inferobasal HK, mild MR. ETT-Ses 5/14 EF 41%. Cardiac MRI 5/14: EF 44%, mild global HK, subepicardial delayed enhancement in nonspecific RV insertion pattern.  . COLONIC POLYPS, HX OF 12/30/2006  . Gout   . H/O atrial flutter 2007   a. Ablations in 2007, 2008.  [name] Heart murmur   . HEPATITIS B, CHRONIC 12/30/2006  . History of alcohol abuse   . History of hiatal hernia   . History of medication noncompliance   . History of nuclear stress test    Myoview 3/17: + chest pain; EF 33%, downsloping ST depression 2, V4-6, LVH with strain, no ischemia on images; intermediate risk   . HYPERCHOLESTEROLEMIA 07/11/2010  . Hypertension   . Left sciatic nerve pain since 04/2015  . LIVER FUNCTION TESTS, ABNORMAL, HX OF 12/30/2006  . MITRAL REGURGITATION 12/30/2006  . 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 PAF (paroxysmal atrial fibrillation) (HCC)    On coumadin  . Rectal bleeding 12/18/2011   Scheduled for colonoscopy.    . Shortness of breath dyspnea    with excertion  . Sleep apnea    "suppose to send mask but they never did" (05/03/2015)  . Type II diabetes mellitus ([place])    Past Surgical History:  Procedure Laterality Date  . A FLUTTER ABLATION  2007, 2008   catheter ablation   . CARDIAC CATHETERIZATION N/A 05/03/2015   Procedure: Right/Left Heart Cath and Coronary Angiography;  Surgeon: Larey Dresser, MD;   Location: [place] CV LAB;  Service: Cardiovascular;  Laterality: N/A;  . CARDIAC CATHETERIZATION N/A 05/03/2015   Procedure: Coronary Stent Intervention;  Surgeon: Jettie Booze, MD;  Location: [place] CV LAB;  Service: Cardiovascular;  Laterality: N/A;  . CARDIAC CATHETERIZATION N/A 11/04/2015   Procedure: Left Heart Cath and Coronary Angiography;  Surgeon: Larey Dresser, MD;  Location: [place] CV LAB;  Service: Cardiovascular;  Laterality: N/A;  . CARDIOVERSION N/A 08/24/2015   Procedure: CARDIOVERSION;  Surgeon: Thayer Headings, MD;  Location: [facility] ENDOSCOPY;  Service: Cardiovascular;  Laterality: N/A;  . CHOLECYSTECTOMY N/A 02/07/2016   Procedure: LAPAROSCOPIC CHOLECYSTECTOMY WITH  INTRAOPERATIVE CHOLANGIOGRAM;  Surgeon: Greer Pickerel, MD;  Location: [facility] ORS;  Service: General;  Laterality: N/A;  . CORONARY ANGIOPLASTY  12/05/01  . LEFT HEART CATHETERIZATION WITH CORONARY ANGIOGRAM N/A 12/19/2011   Procedure: LEFT HEART CATHETERIZATION WITH CORONARY ANGIOGRAM;  Surgeon: Larey Dresser, MD;  Location: [facility] CATH LAB;  Service: Cardiovascular;  Laterality: N/A;  . OSTEOCHONDROMA EXCISION Left 1972   "took bone tumor off my shoulder"    Current Outpatient Prescriptions  Medication Sig Dispense Refill  . ACCU-CHEK SOFTCLIX LANCETS lancets Use as instructed for three times daily testing of blood glucose 100 each 12  . acetaminophen (TYLENOL) 500 MG tablet Take 1,000 mg by mouth every 6 (six) hours as needed for headache (pain). Reported on 08/18/2015    . amLODipine (NORVASC) 5 MG tablet Take 1 tablet (5 mg total) by mouth daily. 90 tablet 3  . aspirin EC 81 MG tablet Take 1 tablet (81 mg total) by mouth daily. DO NOT START UNTIL YOU FINISH PLAVIX (Patient taking differently: Take 81 mg by mouth daily. )    . atorvastatin (LIPITOR) 80 MG tablet Take 1 tablet (80 mg total) by mouth daily. 90 tablet 3  . Blood Glucose Monitoring Suppl (ACCU-CHEK AVIVA PLUS) w/Device KIT Use as directed for 3  times daily testing of blood glucose 1 kit 0  . carvedilol (COREG) 12.5 MG tablet Take 1.5 tablets (18.75 mg total) by mouth 2 (two) times daily with a meal. (Patient taking differently: Take 12.5 mg by mouth 2 (two) times daily with a meal. ) 180 tablet 3  . furosemide (LASIX) 20 MG tablet Take 1 tablet (20 mg total) by mouth daily. Take 2 tablets in the morning and 1 in the evening. (Patient taking differently: Take 40 mg by mouth 2 (two) times daily. Take 2 tablets in the morning and 1 in the evening) 90 tablet 2  . glucose blood (ACCU-CHEK AVIVA PLUS) test strip Use as instructed for 3 times daily testing of blood glucose 100 each 12  . hydrALAZINE (APRESOLINE) 50 MG tablet Take 1 tablet (50 mg total) by mouth 2 (two) times daily. Take 50 mg by mouth 2 (two) times daily. 180 tablet 3  . Insulin Glargine (LANTUS SOLOSTAR)  100 UNIT/ML Solostar Pen Inject 30 Units into the skin every morning. And pen needles 1/day 30 mL 3  . Lancet Devices (ACCU-CHEK SOFTCLIX) lancets Use as instructed for 3 times daily testing of blood glucose 1 each 0  . metFORMIN (GLUCOPHAGE) 1000 MG tablet Take 1 tablet (1,000 mg total) by mouth 2 (two) times daily with a meal. 180 tablet 3  . nitroGLYCERIN (NITROSTAT) 0.4 MG SL tablet Place 1 tablet (0.4 mg total) under the tongue every 5 (five) minutes as needed for chest pain (x 3 tabs daily). Reported on 08/18/2015 100 tablet 1  . potassium chloride SA (K-DUR,KLOR-CON) 20 MEQ tablet Take 1 tablet (20 mEq total) by mouth daily. (Patient taking differently: Take 10 mEq by mouth daily. ) 30 tablet 6  . sotalol (BETAPACE) 120 MG tablet Take 1 tablet (120 mg total) by mouth every 12 (twelve) hours. 180 tablet 3  . spironolactone (ALDACTONE) 25 MG tablet Take 1 tablet (25 mg total) by mouth at bedtime. 90 tablet 3  . TRUEPLUS LANCETS 28G MISC 1 each by Does not apply route 3 (three) times daily. 100 each 12  . warfarin (COUMADIN) 5 MG tablet TAKE AS DIRECTED BY COUMADIN CLINIC (Patient  taking differently: TAKE 1/2 TABLET ON TUES, THURS, SAT AT BEDTIME, TAKE 1 TABLET ON SUN, MON, WED, FRI AT BEDTIME OR AS DIRECTED BY COUMADIN CLINIC) 90 tablet 1   No current facility-administered medications for this encounter.     Allergies  Allergen Reactions  . Ace Inhibitors Cough  . Other Hives and Other (See Comments)    Patient reports developing hives after receiving "some antibiotic given in 1980''s at [facility]". He does not know which antibiotic.    Social History   Social History  . Marital status: Widowed    Spouse name: N/A  . Number of children: 2  . Years of education: N/A   Occupational History  .  Unemployed   Social History Main Topics  . Smoking status: Former Smoker    Packs/day: 4.00    Years: 18.00    Types: Cigarettes    Quit date: 08/14/1983  . Smokeless tobacco: Never Used  . Alcohol use 9.6 oz/week    6 Cans of beer, 10 Shots of liquor per week     Comment: occasionally  . Drug use: Yes    Types: Marijuana     Comment: occasionally  . Sexual activity: No   Other Topics Concern  . Not on file   Social History Narrative  . No narrative on file    Family History  Problem Relation Age of Onset  . Diabetes Mother   . Hypertension Mother   . Heart attack Neg Hx   . Stroke Neg Hx     ROS- All systems are reviewed and negative except as per the HPI above  Physical Exam: Vitals:   09/10/16 1018  BP: 128/70  Pulse: 79  Weight: 212 lb 6.4 oz (96.3 kg)  Height: '5\' 11"'  (1.803 m)   Wt Readings from Last 3 Encounters:  09/10/16 212 lb 6.4 oz (96.3 kg)  08/22/16 211 lb 6.4 oz (95.9 kg)  08/10/16 210 lb 3.2 oz (95.3 kg)    Labs: Lab Results  Component Value Date   NA 136 08/10/2016   K 3.9 08/10/2016   CL 104 08/10/2016   CO2 22 08/10/2016   GLUCOSE 249 (H) 08/10/2016   BUN 16 08/10/2016   CREATININE 1.20 08/10/2016   CALCIUM 9.3 08/10/2016  PHOS 2.6 07/31/2013   MG 1.8 08/10/2016   Lab Results  Component Value  Date   INR 3.10 08/05/2016   Lab Results  Component Value Date   CHOL 104 (L) 07/12/2015   HDL 44 07/12/2015   LDLCALC 44 07/12/2015   TRIG 78 07/12/2015     GEN- The patient is well appearing, alert and oriented x 3 today.   Head- normocephalic, atraumatic Eyes-  Sclera clear, conjunctiva pink Ears- hearing intact Oropharynx- clear Neck- supple, no JVP Lymph- no cervical lymphadenopathy Lungs- Clear to ausculation bilaterally, normal work of breathing Heart- Regular rate and rhythm, no murmurs, rubs or gallops, PMI not laterally displaced GI- soft, NT, ND, + BS Extremities- no clubbing, cyanosis, or edema MS- no significant deformity or atrophy Skin- no rash or lesion Psych- euthymic mood, full affect Neuro- strength and sensation are intact  EKG-NSR, normal EKG at 79 bpm, pr int 186 ms, qrs int 92 ms, qtc 442 ms(stable) Epic records reviewed    Assessment and Plan: 1. Paroxysmal atrial fibrillation On sotalol with restoration of SR with stable qtc Stressed to pt importance  to take sotalol on a regular basis, not to miss doses and not to run out of drug (h/o noncompliance) Continue warfain Continue metoprolol Bmet/mag today  2. CAD Stable Continue atorvastatin Continue asa  3. Lifestyle issues Stressed compliance with medical therapy Discouraged use of alcohol more than 2 drinks a week Encouraged sleep study completion for OSA, pt failed to follow up on cpap titration in 2016 and again this fall when Dr. Aundra Dubin set up another complete sleep study, pending 2/11  4. CHF, chronic Fluid status stable Avoid salt Lasix, spironolactone ,BB, hydralazine as ordered  F/u as scheduled with HF clinic in March afib clinic as needed   [place]. Natia Fahmy, [facility] [address] [place], [place] [address] [phone]

## 2016-09-18 ENCOUNTER — Other Ambulatory Visit: Payer: Self-pay | Admitting: Internal Medicine

## 2016-09-20 ENCOUNTER — Ambulatory Visit: Payer: Medicare Other | Admitting: Pharmacist

## 2016-09-22 IMAGING — CT CT ABDOMEN W/ CM
3 of 4 series · 12 of 36 positions shown, 18 images · IV contrast ([ID] ISOVUE 300)
Comparison: Ultrasound study 06/04/2015, nuclear medicine study
06/05/2015, percutaneous cholecystostomy 06/07/2015

CLINICAL DATA: 59-year-old male with a history of acute calculus
cholecystitis.
TECHNIQUE: Multidetector CT imaging of the abdomen was performed using the
standard protocol following bolus administration of intravenous
contrast.

CONTRAST:  125mL 2DDB6H-6YY IOPAMIDOL (2DDB6H-6YY) INJECTION 61%

[Series 3: abdomen with · axial · 0.84mm/px · z∈[-262,-57]mm · 6 of 59 slices shown, 11 images]
[im 9/59  soft-tissue]
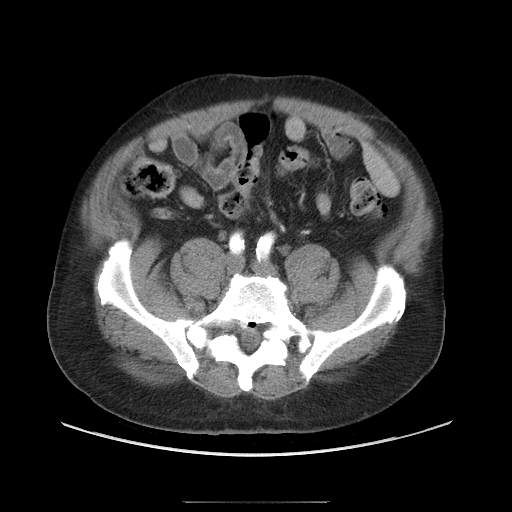
[im 9/59  bone]
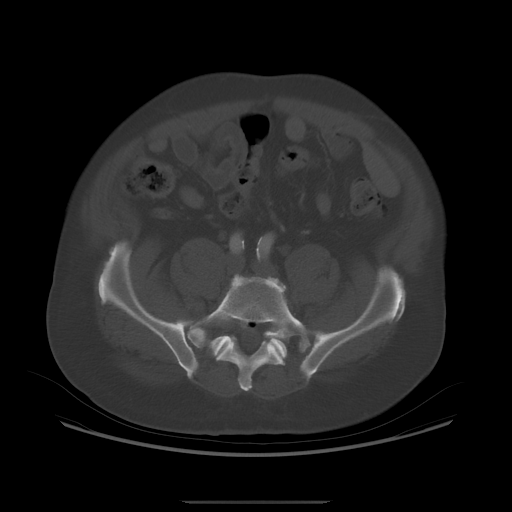
[im 17/59  soft-tissue]
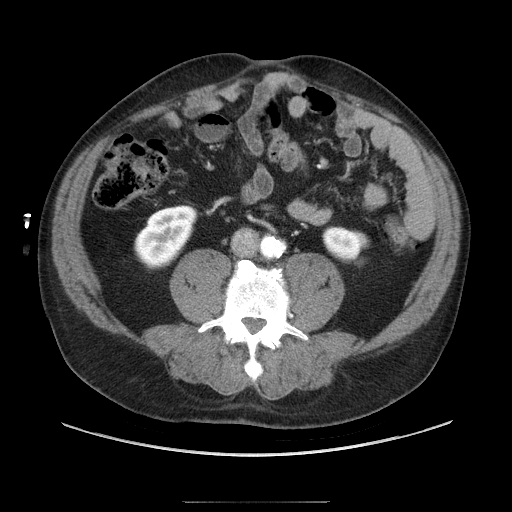
[im 25/59  soft-tissue]
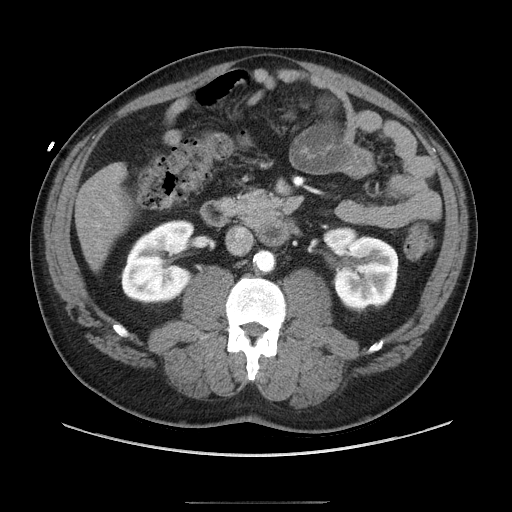
[im 25/59  lung]
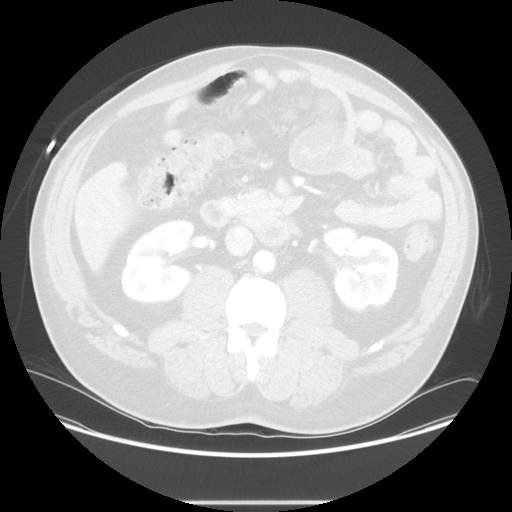
[im 34/59  soft-tissue]
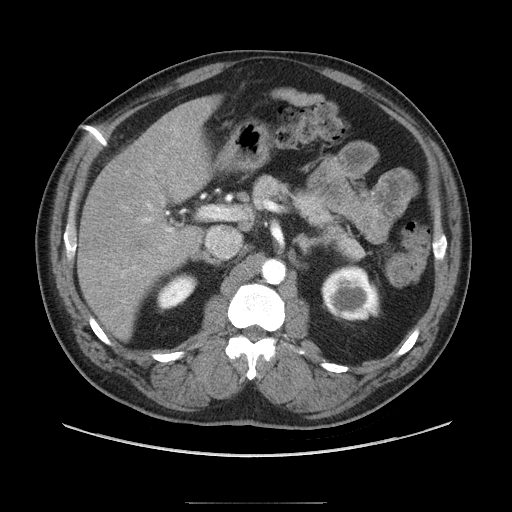
[im 34/59  lung]
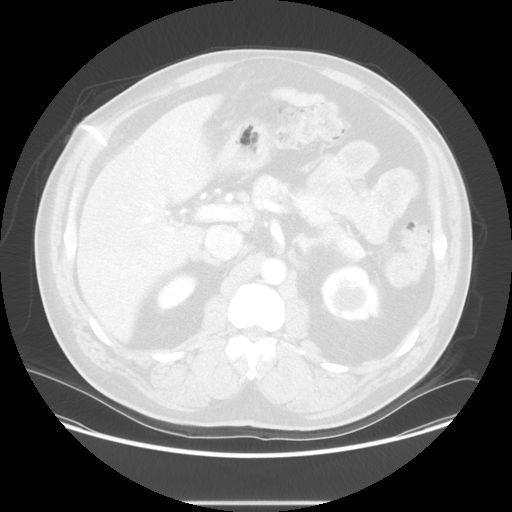
[im 42/59  soft-tissue]
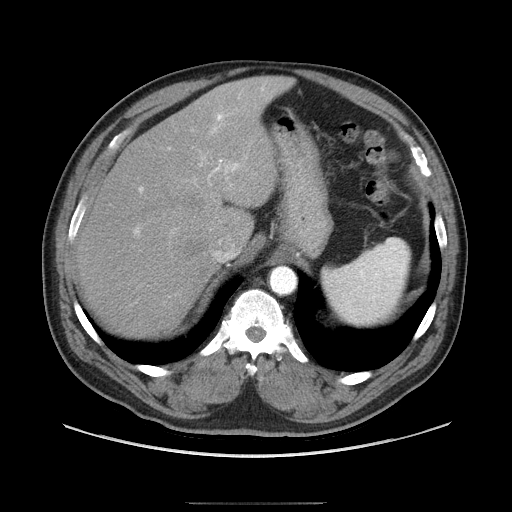
[im 42/59  lung]
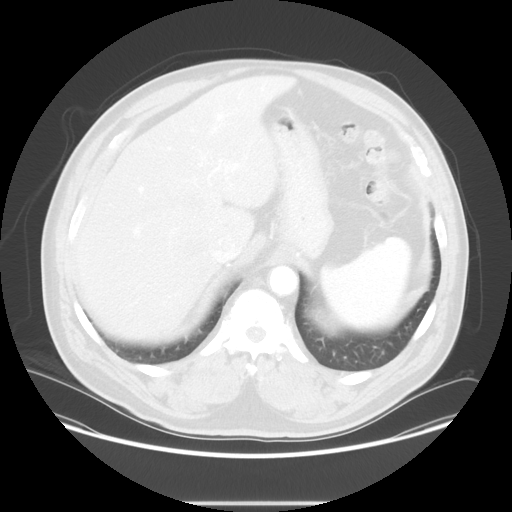
[im 50/59  soft-tissue]
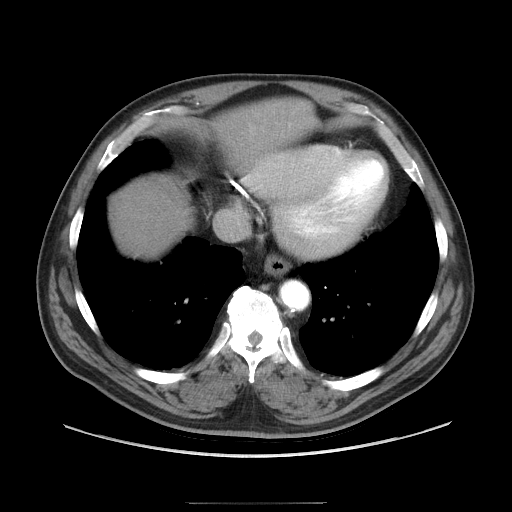
[im 50/59  lung]
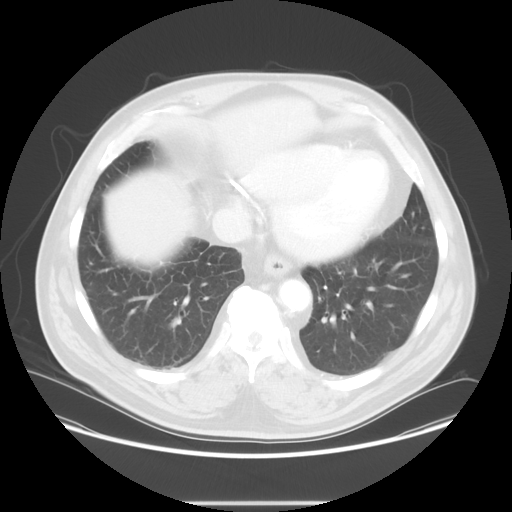

[Series 601: coronal body · coronal · 0.84mm/px · 1 of 136 slices shown, 2 images]
[im 46/136  soft-tissue]
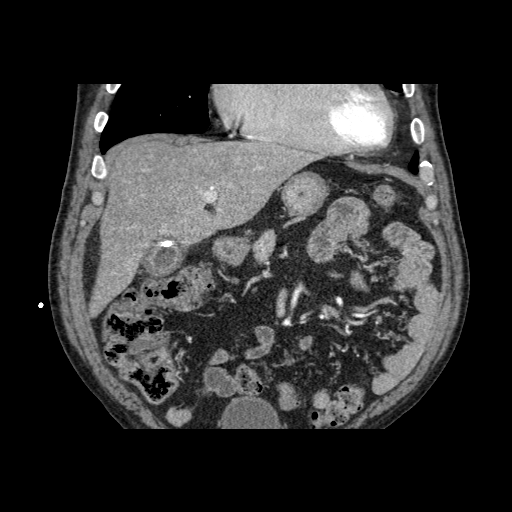
[im 46/136  bone]
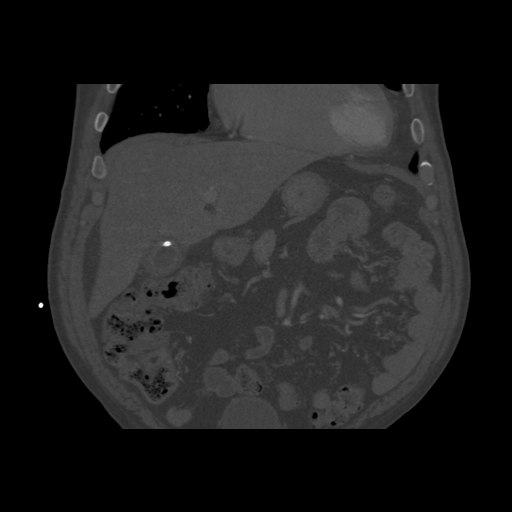

[Series 602: sagittal body · sagittal · 0.84mm/px · 5 of 156 slices shown]
[im 16/156  soft-tissue]
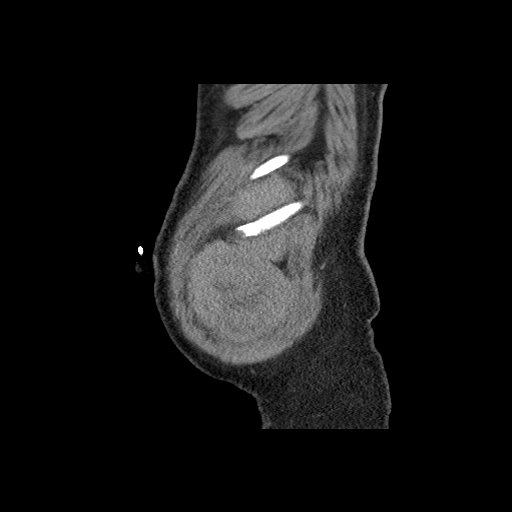
[im 32/156  soft-tissue]
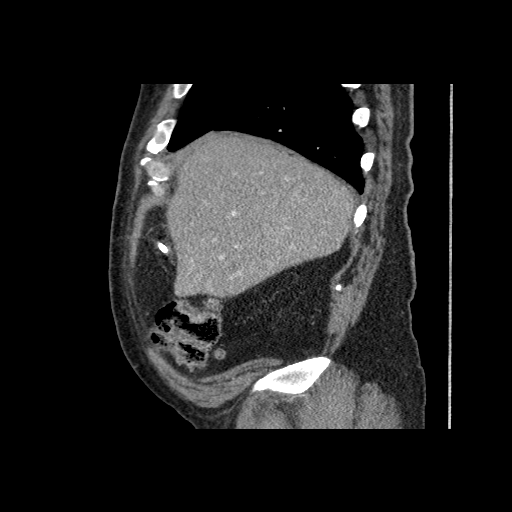
[im 47/156  soft-tissue]
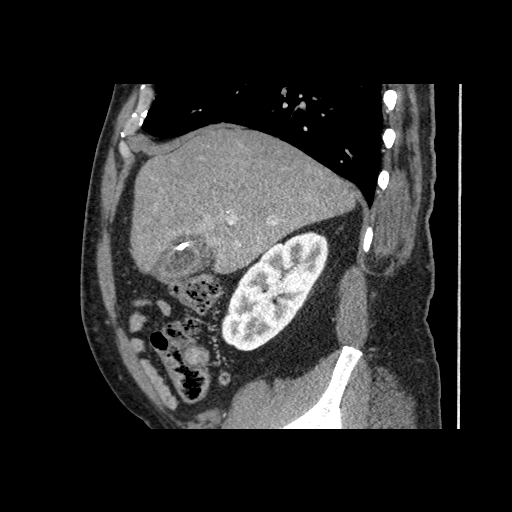
[im 70/156  soft-tissue]
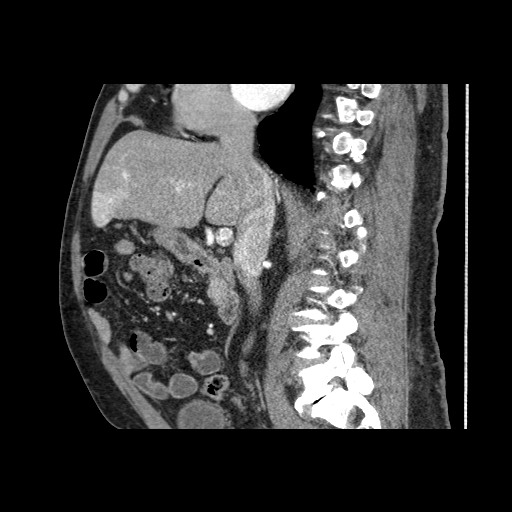
[im 86/156  soft-tissue]
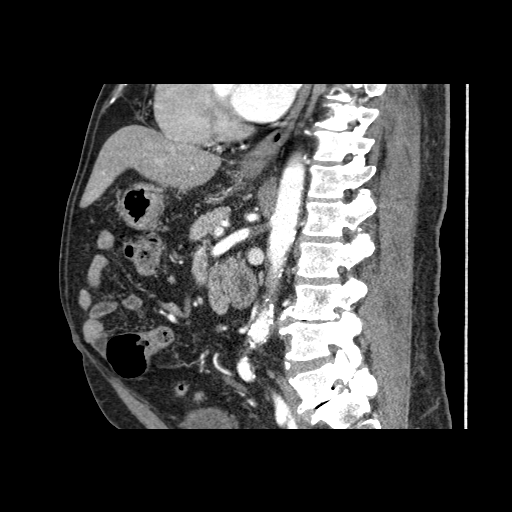

[12 of 36 positions shown; findings below may reference images not displayed]

Patient was admitted 06/04/2015 with acute cholecystitis, and has
had a drug-eluting stent placed May 03, 2015 on anti-platelet
medication. He also has anti coagulation secondary to chronic atrial
flutter. Dr. Rosalee of [REDACTED].

Image guided percutaneous cholecystostomy placed 06/07/2015.

EXAM:
CT ABDOMEN WITH CONTRAST
FINDINGS: Lower chest:

Unremarkable appearance of the soft tissues of the chest wall.

Heart size within normal limits.  No pericardial fluid/thickening.

Evidence of coronary artery disease involving the left anterior
descending coronary artery and the right coronary artery.

No lower mediastinal adenopathy.

Unremarkable appearance of the distal esophagus.

Moderate-sized hiatal hernia.

No confluent airspace disease, pleural fluid, or pneumothorax within
visualized lung.

Abdomen/pelvis:

Hyper enhancing lesion within segment 3 of the liver, previously
characterized on MRI as hemangioma, compatible with current CT
findings.

Unremarkable spleen.

Unremarkable bilateral adrenal glands.

Unremarkable appearance of the pancreas.

Percutaneous cholecystostomy tube traverses the liver terminates
within the gallbladder lumen. Mild gallbladder wall thickening. No
intrahepatic biliary ductal dilatation. No evidence of extrahepatic
biliary ductal dilatation.

Unremarkable appearance of the right kidney. No hydronephrosis or
nephrolithiasis. Unremarkable course of the right ureter.

No left-sided hydronephrosis or nephrolithiasis. Left-sided kidney
cyst again evident, characterized as a Bosniak 2 cyst on prior MRI.

Unremarkable appearance of visualized small bowel. Colonic
diverticular disease without associated inflammatory changes. Normal
appendix visualized.

Atherosclerotic changes throughout the visualized aorta and iliac
vessels. No aneurysm or dissection flap. Mesenteric vessels are
patent.

No displaced fracture.

Degenerative disc disease of L3-L4 and L5-S1. Grade 1
anterolisthesis of L5 on S1 with bilateral L5 pars defects. No bony
canal narrowing.
IMPRESSION: No acute finding of the abdomen CT.

Percutaneous cholecystostomy tube in position within the gallbladder
with mild nonspecific gallbladder wall thickening. No evidence of
intrahepatic or extrahepatic biliary ductal dilatation.

Moderate hiatal hernia.

Atherosclerosis.

## 2016-09-23 ENCOUNTER — Ambulatory Visit (HOSPITAL_BASED_OUTPATIENT_CLINIC_OR_DEPARTMENT_OTHER): Payer: Medicare Other | Attending: Physician Assistant | Admitting: Cardiology

## 2016-09-23 DIAGNOSIS — I480 Paroxysmal atrial fibrillation: Secondary | ICD-10-CM | POA: Diagnosis not present

## 2016-09-23 DIAGNOSIS — I5032 Chronic diastolic (congestive) heart failure: Secondary | ICD-10-CM | POA: Insufficient documentation

## 2016-09-23 DIAGNOSIS — R0683 Snoring: Secondary | ICD-10-CM | POA: Insufficient documentation

## 2016-09-23 DIAGNOSIS — G4736 Sleep related hypoventilation in conditions classified elsewhere: Secondary | ICD-10-CM | POA: Diagnosis not present

## 2016-09-23 DIAGNOSIS — I2510 Atherosclerotic heart disease of native coronary artery without angina pectoris: Secondary | ICD-10-CM

## 2016-09-23 DIAGNOSIS — G4733 Obstructive sleep apnea (adult) (pediatric): Secondary | ICD-10-CM

## 2016-09-23 DIAGNOSIS — I110 Hypertensive heart disease with heart failure: Secondary | ICD-10-CM | POA: Diagnosis not present

## 2016-09-23 DIAGNOSIS — G4734 Idiopathic sleep related nonobstructive alveolar hypoventilation: Secondary | ICD-10-CM | POA: Diagnosis not present

## 2016-09-23 DIAGNOSIS — I10 Essential (primary) hypertension: Secondary | ICD-10-CM

## 2016-09-25 ENCOUNTER — Other Ambulatory Visit: Payer: Self-pay

## 2016-09-25 DIAGNOSIS — G4733 Obstructive sleep apnea (adult) (pediatric): Secondary | ICD-10-CM

## 2016-09-25 NOTE — Procedures (Addendum)
   Patient Name: [name] [name], [name] [name] Date: 09/23/2016 Gender: Male D.O.B: [birth year] Age (years): 60 Referring Provider: Richardson Dopp Height (inches): 4 Interpreting Physician: Fransico Him MD, ABSM Weight (lbs): 214 RPSGT: Madelon Lips BMI: 29 MRN: [id number] Neck Size: 18.00  CLINICAL INFORMATION Sleep Study Type: NPSG Indication for sleep study: OSA Epworth Sleepiness Score: 10  SLEEP STUDY TECHNIQUE As per the AASM Manual for the Scoring of Sleep and Associated Events v2.3 (April 2016) with a hypopnea requiring 4% desaturations.  The channels recorded and monitored were frontal, central and occipital EEG, electrooculogram (EOG), submentalis EMG (chin), nasal and oral airflow, thoracic and abdominal wall motion, anterior tibialis EMG, snore microphone, electrocardiogram, and pulse oximetry.  MEDICATIONS Medications self-administered by patient taken the night of the study : N/A  SLEEP ARCHITECTURE The study was initiated at 10:31:26 PM and ended at 5:01:56 AM.  Sleep onset time was 7.7 minutes and the sleep efficiency was 75.2%. The total sleep time was 293.5 minutes.  Stage REM latency was 98.0 minutes.  The patient spent 13.97% of the night in stage N1 sleep, 69.17% in stage N2 sleep, 0.00% in stage N3 and 16.87% in REM.  Alpha intrusion was absent.  Supine sleep was 5.79%.  RESPIRATORY PARAMETERS The overall apnea/hypopnea index (AHI) was 5 per hour. There were 14 total apneas, including 10 obstructive, 3 central and 1 mixed apneas. There were 10 hypopneas and 38 RERAs.  The AHI during Stage REM sleep was 0.0 per hour.  AHI while supine was 70.6 per hour.  The mean oxygen saturation was 91.25%. The minimum SpO2 during sleep was 85.00%.  Loud snoring was noted during this study.  CARDIAC DATA The 2 lead EKG demonstrated sinus rhythm. The mean heart rate was 62.55 beats per minute. Other EKG findings include: None.  LEG MOVEMENT DATA The total PLMS  were 486 with a resulting PLMS index of 99.35. Associated arousal with leg movement index was 2.7 .  IMPRESSIONS - No significant obstructive sleep apnea occurred during this study (AHI = 5/h). - No significant central sleep apnea occurred during this study (CAI = 0.6/h). - Mild oxygen desaturation was noted during this study (Min O2 = 85.00%). - The patient snored with Loud snoring volume. - No cardiac abnormalities were noted during this study. - Severe periodic limb movements of sleep occurred during the study. No significant associated arousals.  DIAGNOSIS - Nocturnal Hypoxemia (327.26 [G47.36 ICD-10])  RECOMMENDATIONS - Patient has significant OSA during REM sleep only (70/hr) and significant hypoxemia during the events.  Therefore recommend CPAP titration in lab. - Avoid alcohol, sedatives and other CNS depressants that may worsen sleep apnea and disrupt normal sleep architecture. - Sleep hygiene should be reviewed to assess factors that may improve sleep quality. - Weight management and regular exercise should be initiated or continued if appropriate.  [place], American Board of Sleep Medicine  ELECTRONICALLY SIGNED ON:  09/25/2016, 1:01 PM [place] PH: [phone]   FX: [phone] [place]

## 2016-11-12 ENCOUNTER — Ambulatory Visit (HOSPITAL_BASED_OUTPATIENT_CLINIC_OR_DEPARTMENT_OTHER): Payer: Medicare Other | Attending: Cardiology | Admitting: Cardiology

## 2016-11-12 DIAGNOSIS — R0683 Snoring: Secondary | ICD-10-CM | POA: Diagnosis not present

## 2016-11-12 DIAGNOSIS — G4733 Obstructive sleep apnea (adult) (pediatric): Secondary | ICD-10-CM | POA: Insufficient documentation

## 2016-11-19 NOTE — Procedures (Signed)
   Patient Name: [name] [name], [name] [name] Date: 11/12/2016 Gender: Male D.O.B: 1956/02/01 Age (years): 61 Referring Provider: Fransico Him MD, ABSM Height (inches): 72 Interpreting Physician: Fransico Him MD, ABSM Weight (lbs): 214 RPSGT: Madelon Lips BMI: 29 MRN: [id number] Neck Size: 18.00  CLINICAL INFORMATION The patient is referred for a CPAP titration to treat sleep apnea. Date of NPSG, Split Night or HST:09/23/2016  SLEEP STUDY TECHNIQUE As per the AASM Manual for the Scoring of Sleep and Associated Events v2.3 (April 2016) with a hypopnea requiring 4% desaturations.  The channels recorded and monitored were frontal, central and occipital EEG, electrooculogram (EOG), submentalis EMG (chin), nasal and oral airflow, thoracic and abdominal wall motion, anterior tibialis EMG, snore microphone, electrocardiogram, and pulse oximetry. Continuous positive airway pressure (CPAP) was initiated at the beginning of the study and titrated to treat sleep-disordered breathing.  MEDICATIONS Medications self-administered by patient taken the night of the study : N/A  TECHNICIAN COMMENTS Comments added by technician: BATHROOM BREAKS 1X. DIFFICULTY RETURNING TO SLEEP AFTER BATHROOM BREAK. PACS NOTED [facility] [id number] AND 0022  Comments added by scorer: N/A  RESPIRATORY PARAMETERS Optimal PAP Pressure (cm): 10  AHI at Optimal Pressure (/hr):0.0 Overall Minimal O2 (%):86.00  Supine % at Optimal Pressure (%):12 Minimal O2 at Optimal Pressure (%): 90.0    SLEEP ARCHITECTURE The study was initiated at 11:06:08 PM and ended at 5:09:32 AM.  Sleep onset time was 7.7 minutes and the sleep efficiency was 63.7%. The total sleep time was 231.5 minutes.  The patient spent 13.82% of the night in stage N1 sleep, 40.82% in stage N2 sleep, 0.00% in stage N3 and 45.36% in REM.Stage REM latency was 40.5 minutes  Wake after sleep onset was 124.2. Alpha intrusion was absent. Supine sleep was 38.23%.  CARDIAC  DATA The 2 lead EKG demonstrated sinus rhythm. The mean heart rate was 65.00 beats per minute. Other EKG findings include: PVCs.  LEG MOVEMENT DATA The total Periodic Limb Movements of Sleep (PLMS) were 0. The PLMS index was 0.00. A PLMS index of <15 is considered normal in adults.  IMPRESSIONS - The optimal PAP pressure was 10 cm of water. - Central sleep apnea was not noted during this titration (CAI = 0.0/h). - Moderate oxygen desaturations were observed during this titration (min O2 = 86.00%). - The patient snored with Loud snoring volume during this titration study. - 2-lead EKG demonstrated: PVCs - Clinically significant periodic limb movements were not noted during this study. Arousals associated with PLMs were rare.  DIAGNOSIS - Obstructive Sleep Apnea (327.23 [G47.33 ICD-10])  RECOMMENDATIONS - Trial of CPAP therapy on 10 cm H2O with a Medium Wide size Philips Respironics Full Face Mask Dreamwear mask and heated humidification. - Avoid alcohol, sedatives and other CNS depressants that may worsen sleep apnea and disrupt normal sleep architecture. - Sleep hygiene should be reviewed to assess factors that may improve sleep quality. - Weight management and regular exercise should be initiated or continued. - Return to Sleep Center for re-evaluation after 10 weeks of therapy  [place], [facility]  ELECTRONICALLY SIGNED ON:  11/19/2016, 10:32 PM [place] PH: [phone]   FX: [phone] [place]

## 2016-11-20 ENCOUNTER — Telehealth: Payer: Self-pay | Admitting: *Deleted

## 2016-11-20 NOTE — Telephone Encounter (Signed)
-----   Message from Sueanne Margarita, MD sent at 11/19/2016 10:38 PM EDT ----- Pt had successful PAP titration. Please setup appointment in 10 weeks. Please let [facility] know that order for PAP is in EPIC.

## 2016-11-20 NOTE — Telephone Encounter (Signed)
Called the patient with his titration results. He verbalized understanding and agreed with the treatment.  Patient understands he will be contacted by [facility] to set up his cpap.  He understands to call if [facility] does not contact him with new set up in a timely manner.  He understands he will be called once confirmation has been received from [facility] that he has received his new machine to schedule 10 week follow up appointment.   Notified [facility] of new pap order in epic  He was grateful for the call and thanked me

## 2016-11-29 ENCOUNTER — Telehealth: Payer: Self-pay | Admitting: *Deleted

## 2016-11-30 ENCOUNTER — Telehealth: Payer: Self-pay | Admitting: *Deleted

## 2016-11-30 DIAGNOSIS — G4733 Obstructive sleep apnea (adult) (pediatric): Secondary | ICD-10-CM

## 2016-11-30 NOTE — Telephone Encounter (Signed)
-----   Message from Sueanne Margarita, MD sent at 11/29/2016  7:45 PM EDT ----- Regarding: RE: New cpap order Order Resmed CPAP autotitration from 4 to 18cm H2O with mask of choice and heated humidity  Fransico Him, MD ----- Message ----- From: Freada Bergeron, CMA Sent: 11/29/2016   5:43 PM To: Sueanne Margarita, MD Subject: RE: New cpap order                             Dr. Radford Pax home sleep will work better. Thanks ----- Message ----- From: Sueanne Margarita, MD Sent: 11/25/2016   1:39 PM To: Freada Bergeron, CMA Subject: RE: New cpap order                             Rounded off it is 5.  If they wont do it we can get a home sleep study  Traci ----- Message ----- From: Freada Bergeron, CMA Sent: 11/23/2016   5:02 PM To: Sueanne Margarita, MD Subject: Melton Alar: New cpap order                             To Dr. Radford Pax for advisement ----- Message ----- From: Darlina Guys Sent: 11/22/2016   4:34 PM To: Freada Bergeron, CMA Subject: RE: New cpap order                             Stark Bray. This pt's AHI is only 4.9 which won't qualify with insurance.  AHI has to be at least 5.   ----- Message ----- From: Freada Bergeron, CMA Sent: 11/20/2016   9:51 AM To: Melissa Stenson Subject: New cpap order                                 New Cpap order in epic please notify us when patient is set  up. Thanks, Gae Bon

## 2016-11-30 NOTE — Telephone Encounter (Signed)
Called the patient to inform him of his upcoming cpap autotitration but the vm was full and I could not leave a message. [place] notified of the new order

## 2016-11-30 NOTE — Telephone Encounter (Signed)
Home sleep study canceled due to new doctor order for cpap autotitration.

## 2016-12-05 NOTE — Telephone Encounter (Addendum)
Hello Dr. Radford Pax, [facility] contacted me about your order for a auto titration on this patient. Your note says the (AHI=4.9/h) and for insurance it has to be at least a 5. In order for [facility] to do the titration this sleep study needs to be addended so that the AHI=(5/h) is in the sleep study interpretation. Once that is done and we notify them they will then proceed with the titration order.  To Dr. Radford Pax to advise

## 2016-12-05 NOTE — Telephone Encounter (Signed)
Per Dr. Radford Pax she has changed the 09/23/16 sleep study interpretation from 4.9/h to 5/h.  Per Dr. Radford Pax, the order for cpap auto titration is in and the patient has been notified of the test. Patient understands he will be contacted by [facility] for the test. Patient states he has not been wearing his machine because he doesn't know how to put it together. Informed patient to call [place] at [phone] for help Patient verbalized understanding  [facility] notified of the sleep study change

## 2016-12-06 ENCOUNTER — Telehealth: Payer: Self-pay | Admitting: Cardiology

## 2016-12-06 NOTE — Telephone Encounter (Signed)
New Message     [facility] will not give him a [facility] for CPAP they said he do not qualify for it   Gave pt to nina to speak with

## 2016-12-06 NOTE — Telephone Encounter (Signed)
Spoke to patients daughter(Kirra Nicki Reaper [phone]).and explained that the patients sleep study done on 09/23/16 has been amended by Dr. Radford Pax to show a AHI of 5/h so he would qualify with the insurance.  Copy of the sleep study has been fazed over again to [facility] Ailene Ravel). Mariel Craft is aware that [facility] will be calling her concerning this

## 2016-12-07 NOTE — Telephone Encounter (Addendum)
Reached out to [name] [name] at [facility] in the referral department to follow up on the patients approval through his insurance. She informed me they are working on his case but the person working on it has been out a few days. I thanked her for her time and called [name] [name] (patients daughter) and left a message that it is being worked on and that [facility] would be in touch.  Confirmation came in that patient has been set up with his cpap on 12/17/2016. Patient has a 10 week follow up appointment for February 28 2017 at 8am.

## 2016-12-10 ENCOUNTER — Ambulatory Visit (INDEPENDENT_AMBULATORY_CARE_PROVIDER_SITE_OTHER): Payer: Medicare Other

## 2016-12-10 DIAGNOSIS — Z7901 Long term (current) use of anticoagulants: Secondary | ICD-10-CM

## 2016-12-10 DIAGNOSIS — Z5181 Encounter for therapeutic drug level monitoring: Secondary | ICD-10-CM

## 2016-12-10 DIAGNOSIS — I4891 Unspecified atrial fibrillation: Secondary | ICD-10-CM | POA: Diagnosis not present

## 2016-12-10 LAB — POCT INR: INR: 5.1

## 2016-12-19 ENCOUNTER — Ambulatory Visit (HOSPITAL_COMMUNITY)
Admission: RE | Admit: 2016-12-19 | Discharge: 2016-12-19 | Disposition: A | Discharge status: Home / Self Care | Payer: Medicare Other | Source: Ambulatory Visit | Attending: Cardiology | Admitting: Cardiology

## 2016-12-19 ENCOUNTER — Encounter (HOSPITAL_COMMUNITY): Payer: Self-pay

## 2016-12-19 VITALS — BP 170/83 | HR 66 | Ht 72.0 in | Wt 214.8 lb

## 2016-12-19 DIAGNOSIS — Z9049 Acquired absence of other specified parts of digestive tract: Secondary | ICD-10-CM | POA: Insufficient documentation

## 2016-12-19 DIAGNOSIS — G4733 Obstructive sleep apnea (adult) (pediatric): Secondary | ICD-10-CM | POA: Insufficient documentation

## 2016-12-19 DIAGNOSIS — I2510 Atherosclerotic heart disease of native coronary artery without angina pectoris: Secondary | ICD-10-CM | POA: Insufficient documentation

## 2016-12-19 DIAGNOSIS — Z8249 Family history of ischemic heart disease and other diseases of the circulatory system: Secondary | ICD-10-CM | POA: Insufficient documentation

## 2016-12-19 DIAGNOSIS — E0800 Diabetes mellitus due to underlying condition with hyperosmolarity without nonketotic hyperglycemic-hyperosmolar coma (NKHHC): Secondary | ICD-10-CM | POA: Diagnosis not present

## 2016-12-19 DIAGNOSIS — N189 Chronic kidney disease, unspecified: Secondary | ICD-10-CM | POA: Diagnosis not present

## 2016-12-19 DIAGNOSIS — R079 Chest pain, unspecified: Secondary | ICD-10-CM | POA: Diagnosis not present

## 2016-12-19 DIAGNOSIS — E1122 Type 2 diabetes mellitus with diabetic chronic kidney disease: Secondary | ICD-10-CM | POA: Insufficient documentation

## 2016-12-19 DIAGNOSIS — Z87891 Personal history of nicotine dependence: Secondary | ICD-10-CM | POA: Insufficient documentation

## 2016-12-19 DIAGNOSIS — Z794 Long term (current) use of insulin: Secondary | ICD-10-CM | POA: Insufficient documentation

## 2016-12-19 DIAGNOSIS — I429 Cardiomyopathy, unspecified: Secondary | ICD-10-CM | POA: Diagnosis not present

## 2016-12-19 DIAGNOSIS — Z7901 Long term (current) use of anticoagulants: Secondary | ICD-10-CM | POA: Diagnosis not present

## 2016-12-19 DIAGNOSIS — I5032 Chronic diastolic (congestive) heart failure: Secondary | ICD-10-CM

## 2016-12-19 DIAGNOSIS — I129 Hypertensive chronic kidney disease with stage 1 through stage 4 chronic kidney disease, or unspecified chronic kidney disease: Secondary | ICD-10-CM | POA: Insufficient documentation

## 2016-12-19 DIAGNOSIS — E785 Hyperlipidemia, unspecified: Secondary | ICD-10-CM | POA: Diagnosis not present

## 2016-12-19 DIAGNOSIS — M109 Gout, unspecified: Secondary | ICD-10-CM | POA: Diagnosis not present

## 2016-12-19 DIAGNOSIS — I4892 Unspecified atrial flutter: Secondary | ICD-10-CM | POA: Insufficient documentation

## 2016-12-19 DIAGNOSIS — I480 Paroxysmal atrial fibrillation: Secondary | ICD-10-CM | POA: Diagnosis not present

## 2016-12-19 DIAGNOSIS — D1602 Benign neoplasm of scapula and long bones of left upper limb: Secondary | ICD-10-CM | POA: Diagnosis not present

## 2016-12-19 DIAGNOSIS — Z955 Presence of coronary angioplasty implant and graft: Secondary | ICD-10-CM | POA: Diagnosis not present

## 2016-12-19 DIAGNOSIS — E7800 Pure hypercholesterolemia, unspecified: Secondary | ICD-10-CM | POA: Insufficient documentation

## 2016-12-19 LAB — CBC
HCT: 39.9 % (ref 39.0–52.0)
HEMOGLOBIN: 13 g/dL (ref 13.0–17.0)
MCH: 26.4 pg (ref 26.0–34.0)
MCHC: 32.6 g/dL (ref 30.0–36.0)
MCV: 80.9 fL (ref 78.0–100.0)
Platelets: 185 10*3/uL (ref 150–400)
RBC: 4.93 MIL/uL (ref 4.22–5.81)
RDW: 15 % (ref 11.5–15.5)
WBC: 4.9 10*3/uL (ref 4.0–10.5)

## 2016-12-19 LAB — BASIC METABOLIC PANEL
Anion gap: 5 (ref 5–15)
BUN: 11 mg/dL (ref 6–20)
CHLORIDE: 108 mmol/L (ref 101–111)
CO2: 23 mmol/L (ref 22–32)
Calcium: 9.2 mg/dL (ref 8.9–10.3)
Creatinine, Ser: 1.13 mg/dL (ref 0.61–1.24)
GFR calc Af Amer: >60 mL/min (ref >60)
GFR calc non Af Amer: >60 mL/min (ref >60)
GLUCOSE: 204 mg/dL — AB (ref 65–99)
Potassium: 3.9 mmol/L (ref 3.5–5.1)
SODIUM: 136 mmol/L (ref 135–145)

## 2016-12-19 MED ORDER — LOSARTAN POTASSIUM 25 MG PO TABS: 25.0000 mg | ORAL_TABLET | Freq: Every day | ORAL | 3 refills | Status: DC

## 2016-12-19 MED ORDER — ATORVASTATIN CALCIUM 80 MG PO TABS: 80.0000 mg | ORAL_TABLET | Freq: Every day | ORAL | 3 refills | Status: DC

## 2016-12-19 MED ORDER — FUROSEMIDE 20 MG PO TABS: ORAL_TABLET | ORAL | 6 refills | Status: DC

## 2016-12-19 NOTE — Patient Instructions (Signed)
Start Losartan 25 mg daily  Stop Aspirin  Labs today  Labs in 2 weeks  Your physician recommends that you schedule a follow-up appointment in: 3 months

## 2016-12-19 NOTE — Progress Notes (Signed)
Patient ID: [name] [name], male   DOB: 08-26-1955, 61 y.o.   MRN: [id number] PCP: Dr. Doreene Burke Cardiology: Dr. Aundra Dubin  61 yo with history of HTN, diabetes, paroxysmal atrial fibrillation, and CAD presents for cardiology followup.  He was admitted in 5/13 with chest pain with exertion.  LHC was done showing global HK with EF 45% and diffuse, severe distal and branch vessel disease.  There was no interventional option, but I suspect that this disease could be causing his anginal-type pain.  Echo at that time was read as showing EF 60-65%.  He was admitted in 5/14 with hypertensive urgency and chest pain.  He ruled out for MI and BP was controlled.  His EF was 50-55% by echo.  ETT-Sestamibi done as outpatient showed small fixed inferior defect with no ischemia but EF was 31%.  He was quite hypertensive during the study.  Cardiac MRI done to confirm EF in 5/14 showed EF 44%.  He had an episode of atrial fibrillation/RVR in 3/15 but went back into NSR.  Echo in 2/16 showed EF 55-60% with moderate diastolic dysfunction.   He had a Cardiolite in 9/16 showing inferior ischemia.  He was having exertional dyspnea with less activity than normal.  I was concerned for worsened CAD, so took him for [facility] in 9/16. By RHC, filling pressures were not significantly elevated. He had a severe distal RCA stenosis that was treated with DES x 2.  Echo showed EF 55-60%.    In 10/16, he was admitted with acute cholecystitis.  He received a cholecystostomy tube.  Cholecystectomy done in 6/17.   Atrial fibrillation in 1/17, cardioverted to NSR.   Recurrent concerning chest pain in 3/17.  Repeat LHC showed diffuse distal and branch vessel disease consistent with poorly-controlled DM, but patent RCA stents.    He was admitted with atrial fibrillation in 12/17.  Started on sotalol and converted to NSR.    Today, he remains in NSR. Mild dyspnea with heavier exertion like cutting the grass.  BP high today but did not take his  medications this morning.  He has been out of atorvastatin. Weight is stable.  He has been having episodes of sharp, left-sided chest pain lasting about 5 minutes, nonexertional.  He has has this pain on and off with no clear trigger.  Episodes have been occurring for several months.  He drinks occasional ETOH, not heavily anymore.  No palpitations.   Labs (5/13): K 3.6, creatinine 0.93, LDL 85, HDL 57 Labs (7/13): K 3.9, creatinine 1.1, LDL 57, HDL 51  Labs (5/14): K 4, creatinine 1.25 Labs (8/14): K 3.7, creatinine 0.9, LDL 103, HDL 42 Labs (8/15): K 3.5 creatinine 1.0, BNP 75 Labs (2/16): K 3.8, creatinine 1.1 Labs (9/16): K 3.5, creatinine 1.03, BNP 59, HDL 40, LDL 111, TGs 333 Labs (11/16): K 3.6, creaitnine 1.07, LFTs normal, LDL 44 Labs (6/17): K 3.4, creatinine 1.52 => 1.13 Labs (1/18): K 3.9, creatinine 1.25  ECG: NSR, 1st degree AV block, anterolateral T wave inversions, QTc 418 msec  PMH: 1. DM2 2. Gout 3. HTN: Cough with ACEI.  4. Cardiomyopathy: Suspect mixed ischemic/nonischemic (?ETOH-related).  EF 35% in 2008.  Echo in 5/13 showed EF 60-65% with moderate LVH but EF was 45% on LV-gram in 5/13.  Echo (5/14) with EF 50-55%, mild LVH, inferobasal HK, mild MR.  ETT-Sestamibi in 5/14, however, showed EF 31%.  Cardiac MRI (6/14) with EF 44%, mild global hypokinesis, subepicardial delayed enhancement in a nonspecific RV insertion  site pattern.  Echo (2/16) with EF 55-60%, moderate diastolic dysfunction. RHC (9/16) with mean RA 8, PA 39/20 mean 28, mean PCWP 14, CI 2.2. Echo (9/16) with EF 55-60%, grade II diastolic dysfunction.  - Echo (6/17) with EF 55-60%, moderate LVH, grade II diastolic dysfunction, mild AI, mild MR.  5. CAD: LHC in 2010 with mild nonobstructive disease.  LHC (5/13) with diffuse distal and branch vessel disease, mild global hypokinesis and EF 45%.  ETT-Sestamibi (5/14): EF 31%, small fixed inferior defect with no ischemia.  Lexiscan Cardiolite (9/16) with EF 26%,  moderate inferior defect that was partially reversible, suggesting ischemia => High risk study. LHC (9/16) with 40-50% mLAD, diffuse up to 50% distal LAD, 90% ostium of branch off ramus, 95% dRCA => DES to RCA x 2, EF 55-60%.   - LHC (3/17) with diffuse branch and distal vessel disease c/w poorly controlled DM, RCA stents patent.  6. H/o chronic HBV 7. Osteochondroma left shoulder.  8. Hyperlipidemia 9. Paroxysmal atrial fibrillation: Coumadin.  Developed cough and increased ESR with amiodarone.  DCCV in 1/17.  Recurrent atrial fibrillation in 12/17, sotalol started.  10. Atrial flutter: had ablations in 2007 and 2008.  11. OSA on CPAP.  12. Acute cholecystitis (10/16): Cholecystostomy tube placed.  Cholecystectomy 6/17.  13. CKD  SH: Married, prior smoker.  Some ETOH, occasionally heavy in past.  Does use occasional marijuana. Out of work Engineer, drilling. 2 daughters in grad school.   FH: CAD  ROS: All systems reviewed and negative except as per HPI.   Current Outpatient Prescriptions  Medication Sig Dispense Refill  . ACCU-CHEK SOFTCLIX LANCETS lancets Use as instructed for three times daily testing of blood glucose 100 each 12  . acetaminophen (TYLENOL) 500 MG tablet Take 1,000 mg by mouth every 6 (six) hours as needed for headache (pain). Reported on 08/18/2015    . amLODipine (NORVASC) 5 MG tablet Take 1 tablet (5 mg total) by mouth daily. 90 tablet 3  . atorvastatin (LIPITOR) 80 MG tablet Take 1 tablet (80 mg total) by mouth daily. 90 tablet 3  . Blood Glucose Monitoring Suppl (ACCU-CHEK AVIVA PLUS) w/Device KIT Use as directed for 3 times daily testing of blood glucose 1 kit 0  . carvedilol (COREG) 12.5 MG tablet Take 1.5 tablets (18.75 mg total) by mouth 2 (two) times daily with a meal. (Patient taking differently: Take 12.5 mg by mouth 2 (two) times daily with a meal. ) 180 tablet 3  . furosemide (LASIX) 20 MG tablet TAKE TWO TABLETS BY MOUTH IN THE MORNING, THEN TAKE ONE IN THE EVENING 90  tablet 6  . glucose blood (ACCU-CHEK AVIVA PLUS) test strip Use as instructed for 3 times daily testing of blood glucose 100 each 12  . hydrALAZINE (APRESOLINE) 50 MG tablet Take 1 tablet (50 mg total) by mouth 2 (two) times daily. Take 50 mg by mouth 2 (two) times daily. 180 tablet 3  . Insulin Glargine (LANTUS SOLOSTAR) 100 UNIT/ML Solostar Pen Inject 30 Units into the skin every morning. And pen needles 1/day 30 mL 3  . Lancet Devices (ACCU-CHEK SOFTCLIX) lancets Use as instructed for 3 times daily testing of blood glucose 1 each 0  . Magnesium 200 MG TABS Take 1 tablet (200 mg total) by mouth daily. 60 each   . metFORMIN (GLUCOPHAGE) 1000 MG tablet Take 1 tablet (1,000 mg total) by mouth 2 (two) times daily with a meal. 180 tablet 3  . nitroGLYCERIN (NITROSTAT) 0.4 MG SL tablet  Place 1 tablet (0.4 mg total) under the tongue every 5 (five) minutes as needed for chest pain (x 3 tabs daily). Reported on 08/18/2015 100 tablet 1  . potassium chloride SA (K-DUR,KLOR-CON) 10 MEQ tablet Take 1 tablet (10 mEq total) by mouth daily. 30 tablet 6  . sotalol (BETAPACE) 120 MG tablet Take 1 tablet (120 mg total) by mouth every 12 (twelve) hours. 180 tablet 3  . spironolactone (ALDACTONE) 25 MG tablet Take 1 tablet (25 mg total) by mouth at bedtime. 90 tablet 3  . TRUEPLUS LANCETS 28G MISC 1 each by Does not apply route 3 (three) times daily. 100 each 12  . warfarin (COUMADIN) 5 MG tablet TAKE AS DIRECTED BY COUMADIN CLINIC (Patient taking differently: TAKE 1/2 TABLET ON TUES, THURS, SAT AT BEDTIME, TAKE 1 TABLET ON SUN, MON, WED, FRI AT BEDTIME OR AS DIRECTED BY COUMADIN CLINIC) 90 tablet 1  . losartan (COZAAR) 25 MG tablet Take 1 tablet (25 mg total) by mouth daily. 30 tablet 3   No current facility-administered medications for this encounter.     BP (!) 170/83 (BP Location: Left Arm, Patient Position: Sitting, Cuff Size: Large)   Pulse 66   Ht 6' (1.829 m)   Wt 214 lb 12.8 oz (97.4 kg)   BMI 29.13 kg/m   General: NAD Neck: JVP not elevated, no thyromegaly or thyroid nodule.  Lungs: CTAB. CV: Nondisplaced PMI.  Heart regular S1/S2, no S3/S4, no murmur.  No peripheral edema.  No carotid bruit.  Normal pedal pulses.  Abdomen: Soft, nontender, no hepatosplenomegaly, no distention.  Neurologic: Alert and oriented x 3.  Psych: Normal affect. Extremities: No clubbing or cyanosis.   Assessment/Plan:  Atrial fibrillation Paroxysmal atrial fibrillation, continue coumadin. He is in NSR today.  - CBC today.  - Continue sotalol for maintenance of NSR.  Check BMET, QTc is ok on today's ECG. Coronary artery disease LHC in 9/16 showed diffuse CAD, most significant involving the distal RCA.  Patient had DES x 2 to RCA.  Repeat cath in 3/17 with patent RCA stents and diffuse distal and branch vessel disease concerning for poorly controlled diabetes. He has had atypical chest pain recently.  - I will arrange for Lexiscan Cardiolite for risk stratification.  - Now off Plavix and continues on warfarin with INR 2-3.  No need for ASA.  - Restart statin (out for a couple of weeks).  HYPERCHOLESTEROLEMIA  Out of statin for a couple of weeks.  I will restart atorvastatin, check lipids/LFTs in 2 months.  Cardiomyopathy  EF 47% on cardiac MRI in 6/14 but EF improved back to 55-60% on 2/16 echo.  However, EF down to 26% on Cardiolite in 9/16.  I think this was inaccurate.  EF was 55-60% by both LV-gram and echo in 9/16 and 55-60% on 6/17 echo.  He looks euvolemic on exam.  - Continue Lasix 40 qam/20 qpm.   - Check BMET today.  Hypertension BP high but did not take am meds.  He was on losartan in the past.  He cannot take an ACEI due to cough.  I will restart losartan 25 mg daily for BP control and renoprotection in the setting of diabetes. BMET again in 2 wks.  OSA He has started CPAP.  CKD BMET today.   Followup in 3 months.   Loralie Champagne 12/19/2016

## 2016-12-20 ENCOUNTER — Ambulatory Visit (INDEPENDENT_AMBULATORY_CARE_PROVIDER_SITE_OTHER): Payer: Medicare Other | Admitting: Pharmacist

## 2016-12-20 ENCOUNTER — Telehealth (HOSPITAL_COMMUNITY): Payer: Self-pay

## 2016-12-20 DIAGNOSIS — Z5181 Encounter for therapeutic drug level monitoring: Secondary | ICD-10-CM | POA: Diagnosis not present

## 2016-12-20 LAB — HEMOGLOBIN A1C
HEMOGLOBIN A1C: 10.7 % — AB (ref 4.8–5.6)
Mean Plasma Glucose: 260 mg/dL

## 2016-12-20 LAB — POCT INR: INR: 3

## 2016-12-21 ENCOUNTER — Ambulatory Visit (HOSPITAL_COMMUNITY)
Admission: RE | Admit: 2016-12-21 | Discharge: 2016-12-21 | Disposition: A | Discharge status: Home / Self Care | Payer: Medicare Other | Source: Ambulatory Visit | Attending: Cardiovascular Disease | Admitting: Cardiovascular Disease

## 2016-12-21 DIAGNOSIS — R079 Chest pain, unspecified: Secondary | ICD-10-CM | POA: Diagnosis not present

## 2016-12-21 DIAGNOSIS — B1910 Unspecified viral hepatitis B without hepatic coma: Secondary | ICD-10-CM | POA: Insufficient documentation

## 2016-12-21 DIAGNOSIS — I110 Hypertensive heart disease with heart failure: Secondary | ICD-10-CM | POA: Diagnosis not present

## 2016-12-21 DIAGNOSIS — I25110 Atherosclerotic heart disease of native coronary artery with unstable angina pectoris: Secondary | ICD-10-CM | POA: Diagnosis not present

## 2016-12-21 DIAGNOSIS — I4892 Unspecified atrial flutter: Secondary | ICD-10-CM | POA: Insufficient documentation

## 2016-12-21 DIAGNOSIS — I4891 Unspecified atrial fibrillation: Secondary | ICD-10-CM | POA: Diagnosis not present

## 2016-12-21 DIAGNOSIS — I509 Heart failure, unspecified: Secondary | ICD-10-CM | POA: Diagnosis not present

## 2016-12-21 MED ORDER — REGADENOSON 0.4 MG/5ML IV SOLN
0.4000 mg | Freq: Once | INTRAVENOUS | Status: AC
  Administered 2016-12-21: 0.4 mg via INTRAVENOUS

## 2016-12-21 MED ORDER — TECHNETIUM TC 99M TETROFOSMIN IV KIT
10.1000 | PACK | Freq: Once | INTRAVENOUS | Status: AC | PRN
  Administered 2016-12-21: 10.1 via INTRAVENOUS
  Filled 2016-12-21: qty 11

## 2016-12-21 MED ORDER — AMINOPHYLLINE 25 MG/ML IV SOLN
75.0000 mg | Freq: Once | INTRAVENOUS | Status: AC
  Administered 2016-12-21: 75 mg via INTRAVENOUS

## 2016-12-21 MED ORDER — TECHNETIUM TC 99M TETROFOSMIN IV KIT
30.3000 | PACK | Freq: Once | INTRAVENOUS | Status: AC | PRN
  Administered 2016-12-21: 30.3 via INTRAVENOUS
  Filled 2016-12-21: qty 31

## 2016-12-24 LAB — MYOCARDIAL PERFUSION IMAGING
CHL CUP NUCLEAR SRS: 0
CHL CUP RESTING HR STRESS: 52 {beats}/min
LVDIAVOL: 184 mL (ref 62–150)
LVSYSVOL: 115 mL
Peak HR: 81 {beats}/min
SDS: 1
SSS: 1
TID: 1.22

## 2016-12-25 ENCOUNTER — Other Ambulatory Visit (HOSPITAL_COMMUNITY): Payer: Self-pay | Admitting: *Deleted

## 2016-12-25 ENCOUNTER — Telehealth (HOSPITAL_COMMUNITY): Payer: Self-pay | Admitting: *Deleted

## 2016-12-25 DIAGNOSIS — R079 Chest pain, unspecified: Secondary | ICD-10-CM

## 2016-12-25 NOTE — Telephone Encounter (Signed)
No precert reqd 

## 2016-12-27 ENCOUNTER — Ambulatory Visit (HOSPITAL_COMMUNITY)
Admission: RE | Admit: 2016-12-27 | Discharge: 2016-12-27 | Disposition: A | Discharge status: Home / Self Care | Payer: Medicare Other | Source: Ambulatory Visit | Attending: Cardiology | Admitting: Cardiology

## 2016-12-27 ENCOUNTER — Encounter (HOSPITAL_COMMUNITY)
Admission: RE | Disposition: A | Discharge status: Home / Self Care | Payer: Self-pay | Source: Ambulatory Visit | Attending: Cardiology

## 2016-12-27 DIAGNOSIS — Z538 Procedure and treatment not carried out for other reasons: Secondary | ICD-10-CM | POA: Insufficient documentation

## 2016-12-27 DIAGNOSIS — R079 Chest pain, unspecified: Secondary | ICD-10-CM | POA: Diagnosis not present

## 2016-12-27 LAB — GLUCOSE, CAPILLARY: GLUCOSE-CAPILLARY: 174 mg/dL — AB (ref 65–99)

## 2016-12-27 LAB — PROTIME-INR
INR: 2.19
Prothrombin Time: 24.7 seconds — ABNORMAL HIGH (ref 11.4–15.2)

## 2016-12-27 SURGERY — LEFT HEART CATH AND CORONARY ANGIOGRAPHY
Anesthesia: LOCAL

## 2016-12-27 MED ORDER — SODIUM CHLORIDE 0.9 % IV SOLN
INTRAVENOUS | Status: DC
  Administered 2016-12-27: 09:00:00 via INTRAVENOUS

## 2016-12-27 MED ORDER — ASPIRIN 81 MG PO CHEW
81.0000 mg | CHEWABLE_TABLET | ORAL | Status: AC
Start: 2016-12-27 — End: 2016-12-27
  Administered 2016-12-27: 81 mg via ORAL

## 2016-12-27 MED ORDER — SODIUM CHLORIDE 0.9 % IV SOLN: 250.0000 mL | INTRAVENOUS | Status: DC | PRN

## 2016-12-27 MED ORDER — ASPIRIN 81 MG PO CHEW
CHEWABLE_TABLET | ORAL | Status: AC
  Administered 2016-12-27: 81 mg via ORAL
  Filled 2016-12-27: qty 1

## 2016-12-27 MED ORDER — SODIUM CHLORIDE 0.9% FLUSH: 3.0000 mL | INTRAVENOUS | Status: DC | PRN

## 2016-12-27 MED ORDER — SODIUM CHLORIDE 0.9% FLUSH: 3.0000 mL | Freq: Two times a day (BID) | INTRAVENOUS | Status: DC

## 2016-12-27 NOTE — Progress Notes (Signed)
Pt's IVs flushed and secured.  Pt instructed on how to care for IVs if they come out.  Pt verbalizes understanding.  Pt to return tomorrow for procedure at 1030am

## 2016-12-27 NOTE — Telephone Encounter (Signed)
Encounter complete. 

## 2016-12-28 ENCOUNTER — Ambulatory Visit (HOSPITAL_COMMUNITY)
Admission: RE | Disposition: A | Discharge status: Home / Self Care | Payer: Self-pay | Source: Ambulatory Visit | Attending: Cardiology

## 2016-12-28 ENCOUNTER — Ambulatory Visit (HOSPITAL_COMMUNITY)
Admission: RE | Admit: 2016-12-28 | Discharge: 2016-12-28 | Disposition: A | Discharge status: Home / Self Care | Payer: Medicare Other | Source: Ambulatory Visit | Attending: Cardiology | Admitting: Cardiology

## 2016-12-28 DIAGNOSIS — Z955 Presence of coronary angioplasty implant and graft: Secondary | ICD-10-CM | POA: Insufficient documentation

## 2016-12-28 DIAGNOSIS — G4733 Obstructive sleep apnea (adult) (pediatric): Secondary | ICD-10-CM | POA: Diagnosis not present

## 2016-12-28 DIAGNOSIS — I129 Hypertensive chronic kidney disease with stage 1 through stage 4 chronic kidney disease, or unspecified chronic kidney disease: Secondary | ICD-10-CM | POA: Insufficient documentation

## 2016-12-28 DIAGNOSIS — M109 Gout, unspecified: Secondary | ICD-10-CM | POA: Diagnosis not present

## 2016-12-28 DIAGNOSIS — Z7901 Long term (current) use of anticoagulants: Secondary | ICD-10-CM | POA: Insufficient documentation

## 2016-12-28 DIAGNOSIS — I440 Atrioventricular block, first degree: Secondary | ICD-10-CM | POA: Diagnosis not present

## 2016-12-28 DIAGNOSIS — E1122 Type 2 diabetes mellitus with diabetic chronic kidney disease: Secondary | ICD-10-CM | POA: Diagnosis not present

## 2016-12-28 DIAGNOSIS — I429 Cardiomyopathy, unspecified: Secondary | ICD-10-CM | POA: Diagnosis not present

## 2016-12-28 DIAGNOSIS — N189 Chronic kidney disease, unspecified: Secondary | ICD-10-CM | POA: Insufficient documentation

## 2016-12-28 DIAGNOSIS — I480 Paroxysmal atrial fibrillation: Secondary | ICD-10-CM | POA: Insufficient documentation

## 2016-12-28 DIAGNOSIS — E1165 Type 2 diabetes mellitus with hyperglycemia: Secondary | ICD-10-CM | POA: Diagnosis not present

## 2016-12-28 DIAGNOSIS — Z8249 Family history of ischemic heart disease and other diseases of the circulatory system: Secondary | ICD-10-CM | POA: Diagnosis not present

## 2016-12-28 DIAGNOSIS — Z794 Long term (current) use of insulin: Secondary | ICD-10-CM | POA: Diagnosis not present

## 2016-12-28 DIAGNOSIS — I2510 Atherosclerotic heart disease of native coronary artery without angina pectoris: Secondary | ICD-10-CM | POA: Insufficient documentation

## 2016-12-28 DIAGNOSIS — R079 Chest pain, unspecified: Secondary | ICD-10-CM | POA: Diagnosis not present

## 2016-12-28 DIAGNOSIS — E7800 Pure hypercholesterolemia, unspecified: Secondary | ICD-10-CM | POA: Insufficient documentation

## 2016-12-28 DIAGNOSIS — Z87891 Personal history of nicotine dependence: Secondary | ICD-10-CM | POA: Diagnosis not present

## 2016-12-28 HISTORY — PX: LEFT HEART CATH AND CORONARY ANGIOGRAPHY: CATH118249

## 2016-12-28 LAB — PROTIME-INR
INR: 1.71
Prothrombin Time: 20.2 seconds — ABNORMAL HIGH (ref 11.4–15.2)

## 2016-12-28 LAB — GLUCOSE, CAPILLARY: Glucose-Capillary: 133 mg/dL — ABNORMAL HIGH (ref 65–99)

## 2016-12-28 SURGERY — LEFT HEART CATH AND CORONARY ANGIOGRAPHY
Anesthesia: LOCAL

## 2016-12-28 MED ORDER — NITROGLYCERIN 1 MG/10 ML FOR IR/CATH LAB
INTRA_ARTERIAL | Status: AC
  Filled 2016-12-28: qty 10

## 2016-12-28 MED ORDER — VERAPAMIL HCL 2.5 MG/ML IV SOLN
INTRAVENOUS | Status: AC
Start: 2016-12-28 — End: 2016-12-28
  Filled 2016-12-28: qty 2

## 2016-12-28 MED ORDER — SODIUM CHLORIDE 0.9 % WEIGHT BASED INFUSION: 1.0000 mL/kg/h | INTRAVENOUS | Status: DC

## 2016-12-28 MED ORDER — SODIUM CHLORIDE 0.9 % IV SOLN
INTRAVENOUS | Status: DC
  Administered 2016-12-28: 12:00:00 via INTRAVENOUS

## 2016-12-28 MED ORDER — LIDOCAINE HCL (PF) 1 % IJ SOLN
INTRAMUSCULAR | Status: DC | PRN
  Administered 2016-12-28: 3 mL

## 2016-12-28 MED ORDER — HEPARIN (PORCINE) IN NACL 2-0.9 UNIT/ML-% IJ SOLN
INTRAMUSCULAR | Status: AC
Start: 2016-12-28 — End: 2016-12-28
  Filled 2016-12-28: qty 1000

## 2016-12-28 MED ORDER — ACETAMINOPHEN 325 MG PO TABS: 650.0000 mg | ORAL_TABLET | ORAL | Status: DC | PRN

## 2016-12-28 MED ORDER — MIDAZOLAM HCL 2 MG/2ML IJ SOLN
INTRAMUSCULAR | Status: DC | PRN
  Administered 2016-12-28 (×2): 1 mg via INTRAVENOUS

## 2016-12-28 MED ORDER — SODIUM CHLORIDE 0.9% FLUSH: 3.0000 mL | Freq: Two times a day (BID) | INTRAVENOUS | Status: DC

## 2016-12-28 MED ORDER — NITROGLYCERIN 1 MG/10 ML FOR IR/CATH LAB
INTRA_ARTERIAL | Status: DC | PRN
  Administered 2016-12-28 (×3): 200 ug via INTRA_ARTERIAL

## 2016-12-28 MED ORDER — MIDAZOLAM HCL 2 MG/2ML IJ SOLN
INTRAMUSCULAR | Status: AC
  Filled 2016-12-28: qty 2

## 2016-12-28 MED ORDER — LIDOCAINE HCL 1 % IJ SOLN
INTRAMUSCULAR | Status: AC
  Filled 2016-12-28: qty 20

## 2016-12-28 MED ORDER — IOPAMIDOL (ISOVUE-370) INJECTION 76%
INTRAVENOUS | Status: AC
  Filled 2016-12-28: qty 100

## 2016-12-28 MED ORDER — SODIUM CHLORIDE 0.9% FLUSH: 3.0000 mL | INTRAVENOUS | Status: DC | PRN

## 2016-12-28 MED ORDER — FENTANYL CITRATE (PF) 100 MCG/2ML IJ SOLN
INTRAMUSCULAR | Status: DC | PRN
  Administered 2016-12-28 (×2): 25 ug via INTRAVENOUS

## 2016-12-28 MED ORDER — HEPARIN (PORCINE) IN NACL 2-0.9 UNIT/ML-% IJ SOLN: INTRAMUSCULAR | Status: DC | PRN

## 2016-12-28 MED ORDER — VERAPAMIL HCL 2.5 MG/ML IV SOLN
INTRAVENOUS | Status: AC
  Filled 2016-12-28: qty 2

## 2016-12-28 MED ORDER — ASPIRIN 81 MG PO CHEW
81.0000 mg | CHEWABLE_TABLET | ORAL | Status: AC
  Administered 2016-12-28: 81 mg via ORAL

## 2016-12-28 MED ORDER — HEPARIN (PORCINE) IN NACL 2-0.9 UNIT/ML-% IJ SOLN
INTRAMUSCULAR | Status: AC | PRN
  Administered 2016-12-28: 1000 mL

## 2016-12-28 MED ORDER — HEPARIN SODIUM (PORCINE) 1000 UNIT/ML IJ SOLN
INTRAMUSCULAR | Status: DC | PRN
  Administered 2016-12-28: 4500 [IU] via INTRAVENOUS

## 2016-12-28 MED ORDER — VERAPAMIL HCL 2.5 MG/ML IV SOLN
INTRAVENOUS | Status: DC | PRN
  Administered 2016-12-28 (×2): 10 mL via INTRA_ARTERIAL

## 2016-12-28 MED ORDER — HEPARIN SODIUM (PORCINE) 1000 UNIT/ML IJ SOLN
INTRAMUSCULAR | Status: AC
  Filled 2016-12-28: qty 1

## 2016-12-28 MED ORDER — SODIUM CHLORIDE 0.9 % IV SOLN: 250.0000 mL | INTRAVENOUS | Status: DC | PRN

## 2016-12-28 MED ORDER — ONDANSETRON HCL 4 MG/2ML IJ SOLN: 4.0000 mg | Freq: Four times a day (QID) | INTRAMUSCULAR | Status: DC | PRN

## 2016-12-28 MED ORDER — FENTANYL CITRATE (PF) 100 MCG/2ML IJ SOLN
INTRAMUSCULAR | Status: AC
  Filled 2016-12-28: qty 2

## 2016-12-28 MED ORDER — ASPIRIN 81 MG PO CHEW
CHEWABLE_TABLET | ORAL | Status: AC
  Filled 2016-12-28: qty 1

## 2016-12-28 SURGICAL SUPPLY — 13 items
CATH IMPULSE 5F ANG/FL3.5 (CATHETERS) ×1 IMPLANT
CATH INFINITI 4FR 3 DRC (CATHETERS) ×1 IMPLANT
CATH INFINITI 4FR JL3.5 (CATHETERS) ×1 IMPLANT
CATH INFINITI MULTIPACK ANG 4F (CATHETERS) ×1 IMPLANT
DEVICE RAD COMP TR BAND LRG (VASCULAR PRODUCTS) ×1 IMPLANT
GLIDESHEATH SLEND SS 6F .021 (SHEATH) ×1 IMPLANT
GUIDEWIRE INQWIRE 1.5J.035X260 (WIRE) IMPLANT
INQWIRE 1.5J .035X260CM (WIRE) ×2
KIT HEART LEFT (KITS) ×2 IMPLANT
PACK CARDIAC CATHETERIZATION (CUSTOM PROCEDURE TRAY) ×2 IMPLANT
TRANSDUCER W/STOPCOCK (MISCELLANEOUS) ×2 IMPLANT
TUBING CIL FLEX 10 FLL-RA (TUBING) ×2 IMPLANT
WIRE HI TORQ VERSACORE-J 145CM (WIRE) ×1 IMPLANT

## 2016-12-28 NOTE — Discharge Instructions (Signed)
Radial Site Care °Refer to this sheet in the next few weeks. These instructions provide you with information about caring for yourself after your procedure. Your health care provider may also give you more specific instructions. Your treatment has been planned according to current medical practices, but problems sometimes occur. Call your health care provider if you have any problems or questions after your procedure. °What can I expect after the procedure? °After your procedure, it is typical to have the following: °· Bruising at the radial site that usually fades within 1-2 weeks. °· Blood collecting in the tissue (hematoma) that may be painful to the touch. It should usually decrease in size and tenderness within 1-2 weeks. °Follow these instructions at home: °· Take medicines only as directed by your health care provider. °· You may shower 24-48 hours after the procedure or as directed by your health care provider. Remove the bandage (dressing) and gently wash the site with plain soap and water. Pat the area dry with a clean towel. Do not rub the site, because this may cause bleeding. °· Do not take baths, swim, or use a hot tub until your health care provider approves. °· Check your insertion site every day for redness, swelling, or drainage. °· Do not apply powder or lotion to the site. °· Do not flex or bend the affected arm for 24 hours or as directed by your health care provider. °· Do not push or pull heavy objects with the affected arm for 24 hours or as directed by your health care provider. °· Do not lift over 10 lb (4.5 kg) for 5 days after your procedure or as directed by your health care provider. °· Ask your health care provider when it is okay to: °¨ Return to work or school. °¨ Resume usual physical activities or sports. °¨ Resume sexual activity. °· Do not drive home if you are discharged the same day as the procedure. Have someone else drive you. °· You may drive 24 hours after the procedure  unless otherwise instructed by your health care provider. °· Do not operate machinery or power tools for 24 hours after the procedure. °· If your procedure was done as an outpatient procedure, which means that you went home the same day as your procedure, a responsible adult should be with you for the first 24 hours after you arrive home. °· Keep all follow-up visits as directed by your health care provider. This is important. °Contact a health care provider if: °· You have a fever. °· You have chills. °· You have increased bleeding from the radial site. Hold pressure on the site. °Get help right away if: °· You have unusual pain at the radial site. °· You have redness, warmth, or swelling at the radial site. °· You have drainage (other than a small amount of blood on the dressing) from the radial site. °· The radial site is bleeding, and the bleeding does not stop after 30 minutes of holding steady pressure on the site. °· Your arm or hand becomes pale, cool, tingly, or numb. °This information is not intended to replace advice given to you by your health care provider. Make sure you discuss any questions you have with your health care provider. °Document Released: 09/01/2010 Document Revised: 01/05/2016 Document Reviewed: 02/15/2014 °Elsevier Interactive Patient Education © 2017 [employer] ° °

## 2016-12-28 NOTE — H&P (View-Only) (Signed)
Patient ID: [name] [name], male   DOB: [birth year], 61 y.o.   MRN: [id number] PCP: Dr. Doreene Burke Cardiology: Dr. Aundra Dubin  61 yo with history of HTN, diabetes, paroxysmal atrial fibrillation, and CAD presents for cardiology followup.  He was admitted in 5/13 with chest pain with exertion.  LHC was done showing global HK with EF 45% and diffuse, severe distal and branch vessel disease.  There was no interventional option, but I suspect that this disease could be causing his anginal-type pain.  Echo at that time was read as showing EF 60-65%.  He was admitted in 5/14 with hypertensive urgency and chest pain.  He ruled out for MI and BP was controlled.  His EF was 50-55% by echo.  ETT-Sestamibi done as outpatient showed small fixed inferior defect with no ischemia but EF was 31%.  He was quite hypertensive during the study.  Cardiac MRI done to confirm EF in 5/14 showed EF 44%.  He had an episode of atrial fibrillation/RVR in 3/15 but went back into NSR.  Echo in 2/16 showed EF 55-60% with moderate diastolic dysfunction.   He had a Cardiolite in 9/16 showing inferior ischemia.  He was having exertional dyspnea with less activity than normal.  I was concerned for worsened CAD, so took him for [facility] in 9/16. By RHC, filling pressures were not significantly elevated. He had a severe distal RCA stenosis that was treated with DES x 2.  Echo showed EF 55-60%.    In 10/16, he was admitted with acute cholecystitis.  He received a cholecystostomy tube.  Cholecystectomy done in 6/17.   Atrial fibrillation in 1/17, cardioverted to NSR.   Recurrent concerning chest pain in 3/17.  Repeat LHC showed diffuse distal and branch vessel disease consistent with poorly-controlled DM, but patent RCA stents.    He was admitted with atrial fibrillation in 12/17.  Started on sotalol and converted to NSR.    Today, he remains in NSR. Mild dyspnea with heavier exertion like cutting the grass.  BP high today but did not take his  medications this morning.  He has been out of atorvastatin. Weight is stable.  He has been having episodes of sharp, left-sided chest pain lasting about 5 minutes, nonexertional.  He has has this pain on and off with no clear trigger.  Episodes have been occurring for several months.  He drinks occasional ETOH, not heavily anymore.  No palpitations.   Labs (5/13): K 3.6, creatinine 0.93, LDL 85, HDL 57 Labs (7/13): K 3.9, creatinine 1.1, LDL 57, HDL 51  Labs (5/14): K 4, creatinine 1.25 Labs (8/14): K 3.7, creatinine 0.9, LDL 103, HDL 42 Labs (8/15): K 3.5 creatinine 1.0, BNP 75 Labs (2/16): K 3.8, creatinine 1.1 Labs (9/16): K 3.5, creatinine 1.03, BNP 59, HDL 40, LDL 111, TGs 333 Labs (11/16): K 3.6, creaitnine 1.07, LFTs normal, LDL 44 Labs (6/17): K 3.4, creatinine 1.52 => 1.13 Labs (1/18): K 3.9, creatinine 1.25  ECG: NSR, 1st degree AV block, anterolateral T wave inversions, QTc 418 msec  PMH: 1. DM2 2. Gout 3. HTN: Cough with ACEI.  4. Cardiomyopathy: Suspect mixed ischemic/nonischemic (?ETOH-related).  EF 35% in 2008.  Echo in 5/13 showed EF 60-65% with moderate LVH but EF was 45% on LV-gram in 5/13.  Echo (5/14) with EF 50-55%, mild LVH, inferobasal HK, mild MR.  ETT-Sestamibi in 5/14, however, showed EF 31%.  Cardiac MRI (6/14) with EF 44%, mild global hypokinesis, subepicardial delayed enhancement in a nonspecific RV insertion  site pattern.  Echo (2/16) with EF 55-60%, moderate diastolic dysfunction. RHC (9/16) with mean RA 8, PA 39/20 mean 28, mean PCWP 14, CI 2.2. Echo (9/16) with EF 55-60%, grade II diastolic dysfunction.  - Echo (6/17) with EF 55-60%, moderate LVH, grade II diastolic dysfunction, mild AI, mild MR.  5. CAD: LHC in 2010 with mild nonobstructive disease.  LHC (5/13) with diffuse distal and branch vessel disease, mild global hypokinesis and EF 45%.  ETT-Sestamibi (5/14): EF 31%, small fixed inferior defect with no ischemia.  Lexiscan Cardiolite (9/16) with EF 26%,  moderate inferior defect that was partially reversible, suggesting ischemia => High risk study. LHC (9/16) with 40-50% mLAD, diffuse up to 50% distal LAD, 90% ostium of branch off ramus, 95% dRCA => DES to RCA x 2, EF 55-60%.   - LHC (3/17) with diffuse branch and distal vessel disease c/w poorly controlled DM, RCA stents patent.  6. H/o chronic HBV 7. Osteochondroma left shoulder.  8. Hyperlipidemia 9. Paroxysmal atrial fibrillation: Coumadin.  Developed cough and increased ESR with amiodarone.  DCCV in 1/17.  Recurrent atrial fibrillation in 12/17, sotalol started.  10. Atrial flutter: had ablations in 2007 and 2008.  11. OSA on CPAP.  12. Acute cholecystitis (10/16): Cholecystostomy tube placed.  Cholecystectomy 6/17.  13. CKD  SH: Married, prior smoker.  Some ETOH, occasionally heavy in past.  Does use occasional marijuana. Out of work Engineer, drilling. 2 daughters in grad school.   FH: CAD  ROS: All systems reviewed and negative except as per HPI.   Current Outpatient Prescriptions  Medication Sig Dispense Refill  . ACCU-CHEK SOFTCLIX LANCETS lancets Use as instructed for three times daily testing of blood glucose 100 each 12  . acetaminophen (TYLENOL) 500 MG tablet Take 1,000 mg by mouth every 6 (six) hours as needed for headache (pain). Reported on 08/18/2015    . amLODipine (NORVASC) 5 MG tablet Take 1 tablet (5 mg total) by mouth daily. 90 tablet 3  . atorvastatin (LIPITOR) 80 MG tablet Take 1 tablet (80 mg total) by mouth daily. 90 tablet 3  . Blood Glucose Monitoring Suppl (ACCU-CHEK AVIVA PLUS) w/Device KIT Use as directed for 3 times daily testing of blood glucose 1 kit 0  . carvedilol (COREG) 12.5 MG tablet Take 1.5 tablets (18.75 mg total) by mouth 2 (two) times daily with a meal. (Patient taking differently: Take 12.5 mg by mouth 2 (two) times daily with a meal. ) 180 tablet 3  . furosemide (LASIX) 20 MG tablet TAKE TWO TABLETS BY MOUTH IN THE MORNING, THEN TAKE ONE IN THE EVENING 90  tablet 6  . glucose blood (ACCU-CHEK AVIVA PLUS) test strip Use as instructed for 3 times daily testing of blood glucose 100 each 12  . hydrALAZINE (APRESOLINE) 50 MG tablet Take 1 tablet (50 mg total) by mouth 2 (two) times daily. Take 50 mg by mouth 2 (two) times daily. 180 tablet 3  . Insulin Glargine (LANTUS SOLOSTAR) 100 UNIT/ML Solostar Pen Inject 30 Units into the skin every morning. And pen needles 1/day 30 mL 3  . Lancet Devices (ACCU-CHEK SOFTCLIX) lancets Use as instructed for 3 times daily testing of blood glucose 1 each 0  . Magnesium 200 MG TABS Take 1 tablet (200 mg total) by mouth daily. 60 each   . metFORMIN (GLUCOPHAGE) 1000 MG tablet Take 1 tablet (1,000 mg total) by mouth 2 (two) times daily with a meal. 180 tablet 3  . nitroGLYCERIN (NITROSTAT) 0.4 MG SL tablet  Place 1 tablet (0.4 mg total) under the tongue every 5 (five) minutes as needed for chest pain (x 3 tabs daily). Reported on 08/18/2015 100 tablet 1  . potassium chloride SA (K-DUR,KLOR-CON) 10 MEQ tablet Take 1 tablet (10 mEq total) by mouth daily. 30 tablet 6  . sotalol (BETAPACE) 120 MG tablet Take 1 tablet (120 mg total) by mouth every 12 (twelve) hours. 180 tablet 3  . spironolactone (ALDACTONE) 25 MG tablet Take 1 tablet (25 mg total) by mouth at bedtime. 90 tablet 3  . TRUEPLUS LANCETS 28G MISC 1 each by Does not apply route 3 (three) times daily. 100 each 12  . warfarin (COUMADIN) 5 MG tablet TAKE AS DIRECTED BY COUMADIN CLINIC (Patient taking differently: TAKE 1/2 TABLET ON TUES, THURS, SAT AT BEDTIME, TAKE 1 TABLET ON SUN, MON, WED, FRI AT BEDTIME OR AS DIRECTED BY COUMADIN CLINIC) 90 tablet 1  . losartan (COZAAR) 25 MG tablet Take 1 tablet (25 mg total) by mouth daily. 30 tablet 3   No current facility-administered medications for this encounter.     BP (!) 170/83 (BP Location: Left Arm, Patient Position: Sitting, Cuff Size: Large)   Pulse 66   Ht 6' (1.829 m)   Wt 214 lb 12.8 oz (97.4 kg)   BMI 29.13 kg/m   General: NAD Neck: JVP not elevated, no thyromegaly or thyroid nodule.  Lungs: CTAB. CV: Nondisplaced PMI.  Heart regular S1/S2, no S3/S4, no murmur.  No peripheral edema.  No carotid bruit.  Normal pedal pulses.  Abdomen: Soft, nontender, no hepatosplenomegaly, no distention.  Neurologic: Alert and oriented x 3.  Psych: Normal affect. Extremities: No clubbing or cyanosis.   Assessment/Plan:  Atrial fibrillation Paroxysmal atrial fibrillation, continue coumadin. He is in NSR today.  - CBC today.  - Continue sotalol for maintenance of NSR.  Check BMET, QTc is ok on today's ECG. Coronary artery disease LHC in 9/16 showed diffuse CAD, most significant involving the distal RCA.  Patient had DES x 2 to RCA.  Repeat cath in 3/17 with patent RCA stents and diffuse distal and branch vessel disease concerning for poorly controlled diabetes. He has had atypical chest pain recently.  - I will arrange for Lexiscan Cardiolite for risk stratification.  - Now off Plavix and continues on warfarin with INR 2-3.  No need for ASA.  - Restart statin (out for a couple of weeks).  HYPERCHOLESTEROLEMIA  Out of statin for a couple of weeks.  I will restart atorvastatin, check lipids/LFTs in 2 months.  Cardiomyopathy  EF 47% on cardiac MRI in 6/14 but EF improved back to 55-60% on 2/16 echo.  However, EF down to 26% on Cardiolite in 9/16.  I think this was inaccurate.  EF was 55-60% by both LV-gram and echo in 9/16 and 55-60% on 6/17 echo.  He looks euvolemic on exam.  - Continue Lasix 40 qam/20 qpm.   - Check BMET today.  Hypertension BP high but did not take am meds.  He was on losartan in the past.  He cannot take an ACEI due to cough.  I will restart losartan 25 mg daily for BP control and renoprotection in the setting of diabetes. BMET again in 2 wks.  OSA He has started CPAP.  CKD BMET today.   Followup in 3 months.   Loralie Champagne 12/19/2016

## 2016-12-28 NOTE — Interval H&P Note (Signed)
History and Physical Interval Note:  12/28/2016 1:06 PM  [name] [name]  has presented today for surgery, with the diagnosis of cp, abnormal myoview  The various methods of treatment have been discussed with the patient and family. After consideration of risks, benefits and other options for treatment, the patient has consented to  Procedure(s): Left Heart Cath and Coronary Angiography (N/A) as a surgical intervention .  The patient's history has been reviewed, patient examined, no change in status, stable for surgery.  I have reviewed the patient's chart and labs.  Questions were answered to the patient's satisfaction.     Dalton [employer]

## 2016-12-31 ENCOUNTER — Encounter (HOSPITAL_COMMUNITY): Payer: Self-pay | Admitting: Cardiology

## 2016-12-31 MED FILL — Verapamil HCl IV Soln 2.5 MG/ML: INTRAVENOUS | Qty: 2 | Status: AC

## 2016-12-31 MED FILL — Nitroglycerin IV Soln 100 MCG/ML in D5W: INTRA_ARTERIAL | Qty: 10 | Status: CN

## 2017-01-02 ENCOUNTER — Ambulatory Visit (HOSPITAL_COMMUNITY)
Admission: RE | Admit: 2017-01-02 | Discharge: 2017-01-02 | Disposition: A | Discharge status: Home / Self Care | Payer: Medicare Other | Source: Ambulatory Visit | Attending: Internal Medicine | Admitting: Internal Medicine

## 2017-01-02 DIAGNOSIS — I5032 Chronic diastolic (congestive) heart failure: Secondary | ICD-10-CM | POA: Diagnosis not present

## 2017-01-02 LAB — BASIC METABOLIC PANEL
ANION GAP: 9 (ref 5–15)
BUN: 15 mg/dL (ref 6–20)
CALCIUM: 9.6 mg/dL (ref 8.9–10.3)
CO2: 23 mmol/L (ref 22–32)
Chloride: 104 mmol/L (ref 101–111)
Creatinine, Ser: 1.43 mg/dL — ABNORMAL HIGH (ref 0.61–1.24)
GFR calc Af Amer: 59 mL/min — ABNORMAL LOW (ref >60)
GFR calc non Af Amer: 51 mL/min — ABNORMAL LOW (ref >60)
GLUCOSE: 133 mg/dL — AB (ref 65–99)
Potassium: 3.9 mmol/L (ref 3.5–5.1)
Sodium: 136 mmol/L (ref 135–145)

## 2017-01-10 ENCOUNTER — Ambulatory Visit (INDEPENDENT_AMBULATORY_CARE_PROVIDER_SITE_OTHER): Payer: Medicare Other | Admitting: *Deleted

## 2017-01-10 DIAGNOSIS — Z5181 Encounter for therapeutic drug level monitoring: Secondary | ICD-10-CM | POA: Diagnosis not present

## 2017-01-10 LAB — POCT INR: INR: 3.4

## 2017-01-18 ENCOUNTER — Encounter: Payer: Self-pay | Admitting: Cardiology

## 2017-01-21 ENCOUNTER — Emergency Department (HOSPITAL_COMMUNITY)
Admission: EM | Admit: 2017-01-21 | Discharge: 2017-01-22 | Disposition: A | Discharge status: Home / Self Care | Payer: Medicare Other | Attending: Emergency Medicine | Admitting: Emergency Medicine

## 2017-01-21 ENCOUNTER — Ambulatory Visit (INDEPENDENT_AMBULATORY_CARE_PROVIDER_SITE_OTHER): Payer: Medicare Other

## 2017-01-21 ENCOUNTER — Encounter (HOSPITAL_COMMUNITY): Payer: Self-pay | Admitting: Emergency Medicine

## 2017-01-21 DIAGNOSIS — Z5181 Encounter for therapeutic drug level monitoring: Secondary | ICD-10-CM | POA: Diagnosis not present

## 2017-01-21 DIAGNOSIS — I2510 Atherosclerotic heart disease of native coronary artery without angina pectoris: Secondary | ICD-10-CM | POA: Insufficient documentation

## 2017-01-21 DIAGNOSIS — I5032 Chronic diastolic (congestive) heart failure: Secondary | ICD-10-CM | POA: Diagnosis not present

## 2017-01-21 DIAGNOSIS — Z87891 Personal history of nicotine dependence: Secondary | ICD-10-CM | POA: Diagnosis not present

## 2017-01-21 DIAGNOSIS — I110 Hypertensive heart disease with heart failure: Secondary | ICD-10-CM | POA: Insufficient documentation

## 2017-01-21 DIAGNOSIS — R041 Hemorrhage from throat: Secondary | ICD-10-CM | POA: Insufficient documentation

## 2017-01-21 DIAGNOSIS — K1379 Other lesions of oral mucosa: Secondary | ICD-10-CM

## 2017-01-21 DIAGNOSIS — Z794 Long term (current) use of insulin: Secondary | ICD-10-CM | POA: Insufficient documentation

## 2017-01-21 DIAGNOSIS — Z7901 Long term (current) use of anticoagulants: Secondary | ICD-10-CM | POA: Diagnosis not present

## 2017-01-21 DIAGNOSIS — E119 Type 2 diabetes mellitus without complications: Secondary | ICD-10-CM | POA: Insufficient documentation

## 2017-01-21 LAB — BASIC METABOLIC PANEL
ANION GAP: 8 (ref 5–15)
BUN: 14 mg/dL (ref 6–20)
CHLORIDE: 105 mmol/L (ref 101–111)
CO2: 22 mmol/L (ref 22–32)
Calcium: 9.5 mg/dL (ref 8.9–10.3)
Creatinine, Ser: 1.21 mg/dL (ref 0.61–1.24)
GFR calc Af Amer: >60 mL/min (ref >60)
GLUCOSE: 107 mg/dL — AB (ref 65–99)
POTASSIUM: 3.8 mmol/L (ref 3.5–5.1)
Sodium: 135 mmol/L (ref 135–145)

## 2017-01-21 LAB — PROTIME-INR
INR: 2.18
PROTHROMBIN TIME: 24.7 s — AB (ref 11.4–15.2)

## 2017-01-21 LAB — CBC
HEMATOCRIT: 39.7 % (ref 39.0–52.0)
HEMOGLOBIN: 13.1 g/dL (ref 13.0–17.0)
MCH: 26.4 pg (ref 26.0–34.0)
MCHC: 33 g/dL (ref 30.0–36.0)
MCV: 80 fL (ref 78.0–100.0)
PLATELETS: 185 10*3/uL (ref 150–400)
RBC: 4.96 MIL/uL (ref 4.22–5.81)
RDW: 14.8 % (ref 11.5–15.5)
WBC: 8.2 10*3/uL (ref 4.0–10.5)

## 2017-01-21 LAB — POCT INR: INR: 2.6

## 2017-01-21 NOTE — ED Triage Notes (Signed)
Pt presents to ED for assessment after having three teeth pulled over and continuing to have bleeding.  Patient has tried black tea bags, as well as pressure with gauze to the area.  Nurse on call informed patient to come to ED for assessment of PT-INR, and Hgb.  Patient denies nausea, denies dizziness, SOB, CP.

## 2017-01-21 NOTE — ED Provider Notes (Signed)
[name] [name] DEPT Provider Note   CSN: [id number] Arrival date & time: 01/21/17  2134     History   Chief Complaint Chief Complaint  Patient presents with  . oral bleeding    HPI [name] [name] is a 61 y.o. male.  HPI   [name] [name] is a 61 y.o. male, with a history of A. Fib, CHF, and DM, presenting to the ED with Oral bleeding. Patient states he had 3 right-sided maxillary teeth extracted around 1 PM today by [facility] on [address]. At some point this evening, patient noticed his mouth was still bleeding. Patient tried black tea bags and gauze with pressure to the area without improvement. He states the bleeding was bright red and dripping steadily. Denies difficulty breathing or swallowing, increased pain, dizziness or lightheadedness, or any other complaints.  Patient is on Coumadin. Patient had his INR checked prior to the procedure and it was found to be 2.6.    Past Medical History:  Diagnosis Date  . Anginal pain ([place])   . Anxiety   . Arrhythmia   . CAD (coronary artery disease), native coronary artery    a. Nonobstructive CAD by cath 2013 - diffuse distal and branch vessel CAD, no severe disease in the major coronaries, LV mild global hypokinesis, EF 45%. b. ETT-Sestamibi 5/14: EF 31%, small fixed inferior defect with no ischemia.  . Cholecystitis   . Chronic CHF ([place])    a. Mixed ICM/NICM (?EtOH). EF 35% in 2008. Echo 5/13: EF 60-65%, mod LVH, EF 45% on V gram in 12/2011. EF 12/2012: EF 50-55%, mild LVH, inferobasal HK, mild MR. ETT-Ses 5/14 EF 41%. Cardiac MRI 5/14: EF 44%, mild global HK, subepicardial delayed enhancement in nonspecific RV insertion pattern.  . COLONIC POLYPS, HX OF 12/30/2006  . Gout   . H/O atrial flutter 2007   a. Ablations in 2007, 2008.  [name] Heart murmur   . HEPATITIS B, CHRONIC 12/30/2006  . History of alcohol abuse   . History of hiatal hernia   . History of medication noncompliance   . History of nuclear stress test    Myoview 3/17: + chest pain; EF 33%, downsloping ST depression 2, V4-6, LVH with strain, no ischemia on images; intermediate risk   . HYPERCHOLESTEROLEMIA 07/11/2010  . Hypertension   . Left sciatic nerve pain since 04/2015  . LIVER FUNCTION TESTS, ABNORMAL, HX OF 12/30/2006  . MITRAL REGURGITATION 12/30/2006  . 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 PAF (paroxysmal atrial fibrillation) ([facility])    On coumadin  . Rectal bleeding 12/18/2011   Scheduled for colonoscopy.    . Shortness of breath dyspnea    with excertion  . Sleep apnea    "suppose to send mask but they never did" (05/03/2015)  . Type II diabetes mellitus [facility])     Patient Active Problem List   Diagnosis Date Noted  . Unstable angina ([place]) 08/03/2016  . S/P laparoscopic cholecystectomy 02/07/2016  . Coronary artery disease involving native coronary artery with unstable angina pectoris ([place]) 12/29/2015  . Diabetes ([place]) 11/12/2015  . Chronic anticoagulation 08/22/2015  . Atrial flutter with controlled response ([place]) 08/22/2015  . RUQ pain   . Cholelithiases- drain in place 06/04/2015  . Essential hypertension   . Chronic diastolic CHF (congestive heart failure) ([place]) 04/24/2015  . Excessive daytime sleepiness 11/25/2014  . OSA (obstructive sleep apnea) 11/25/2014  . NICM (nonischemic cardiomyopathy) ([place]) 03/11/2014  . Anticoagulation goal of INR 2 to 3  10/19/2013  . Eczema 10/19/2013  . Low libido 10/16/2013  . Persistent atrial fibrillation ([place]) 10/06/2013  . Encounter for therapeutic drug monitoring 09/10/2013  . Rectal bleeding 12/18/2011  . HYPERCHOLESTEROLEMIA 07/11/2010  . DENTAL CARIES 06/30/2010  . GOUT 05/05/2009  . HEPATITIS B, CHRONIC 12/30/2006  . OSTEOCHONDROMA 12/30/2006  . Mitral valve disorder 12/30/2006  . LIVER FUNCTION TESTS, ABNORMAL, HX OF 12/30/2006  . COLONIC POLYPS, HX OF 12/30/2006  . COLONOSCOPY, HX OF 05/12/2001    Past Surgical History:  Procedure Laterality Date  . A  FLUTTER ABLATION  2007, 2008   catheter ablation   . CARDIAC CATHETERIZATION N/A 05/03/2015   Procedure: Right/Left Heart Cath and Coronary Angiography;  Surgeon: Larey Dresser, MD;  Location: [place] CV LAB;  Service: Cardiovascular;  Laterality: N/A;  . CARDIAC CATHETERIZATION N/A 05/03/2015   Procedure: Coronary Stent Intervention;  Surgeon: Jettie Booze, MD;  Location: [place] CV LAB;  Service: Cardiovascular;  Laterality: N/A;  . CARDIAC CATHETERIZATION N/A 11/04/2015   Procedure: Left Heart Cath and Coronary Angiography;  Surgeon: Larey Dresser, MD;  Location: [place] CV LAB;  Service: Cardiovascular;  Laterality: N/A;  . CARDIOVERSION N/A 08/24/2015   Procedure: CARDIOVERSION;  Surgeon: Thayer Headings, MD;  Location: [facility] ENDOSCOPY;  Service: Cardiovascular;  Laterality: N/A;  . CHOLECYSTECTOMY N/A 02/07/2016   Procedure: LAPAROSCOPIC CHOLECYSTECTOMY WITH  INTRAOPERATIVE CHOLANGIOGRAM;  Surgeon: Greer Pickerel, MD;  Location: [facility] ORS;  Service: General;  Laterality: N/A;  . CORONARY ANGIOPLASTY  12/05/01  . LEFT HEART CATH AND CORONARY ANGIOGRAPHY N/A 12/28/2016   Procedure: Left Heart Cath and Coronary Angiography;  Surgeon: Larey Dresser, MD;  Location: [place] [facility] LAB;  Service: Cardiovascular;  Laterality: N/A;  . LEFT HEART CATHETERIZATION WITH CORONARY ANGIOGRAM N/A 12/19/2011   Procedure: LEFT HEART CATHETERIZATION WITH CORONARY ANGIOGRAM;  Surgeon: Larey Dresser, MD;  Location[facility] C[facility] LAB;  Service: Cardiovascular;  Laterality: N/A;  . OSTEOCHONDROMA EXCISION Left 1972   "took bone tumor off my shoulder"       Home Medications    Prior to Admission medications   Medication Sig Start Date End Date Taking? Authorizing Provider  ACCU-CHEK SOFTCLIX LANCETS lancets Use as instructed for three times daily testing of blood glucose Patient taking differently: 1 each by Other route daily.  09/06/16   Tresa Garter, MD  acetaminophen (TYLENOL) 500 MG tablet  Take 1,000 mg by mouth every 6 (six) hours as needed for headache (pain). Reported on 08/18/2015    [provider]  amLODipine (NORVASC) 5 MG tablet Take 1 tablet (5 mg total) by mouth daily. 08/22/16   Tresa Garter, MD  atorvastatin (LIPITOR) 80 MG tablet Take 1 tablet (80 mg total) by mouth daily. 12/19/16   Larey Dresser, MD  Blood Glucose Monitoring Suppl (ACCU-CHEK AVIVA PLUS) w/Device KIT Use as directed for 3 times daily testing of blood glucose Patient taking differently: 1 strip by Other route daily.  09/06/16   Tresa Garter, MD  carvedilol (COREG) 12.5 MG tablet Take 1.5 tablets (18.75 mg total) by mouth 2 (two) times daily with a meal. Patient taking differently: Take 12.5 mg by mouth 2 (two) times daily with a meal.  08/22/16   Tresa Garter, MD  Cholecalciferol (VITAMIN D3) 2000 units TABS Take 2,000 Units by mouth daily.    [provider]  furosemide (LASIX) 20 MG tablet TAKE TWO TABLETS BY MOUTH IN THE MORNING, THEN TAKE ONE IN  THE EVENING 12/19/16   Larey Dresser, MD  glucose blood (ACCU-CHEK AVIVA PLUS) test strip Use as instructed for 3 times daily testing of blood glucose Patient taking differently: 1 each by Other route daily.  09/06/16   Tresa Garter, MD  hydrALAZINE (APRESOLINE) 50 MG tablet Take 1 tablet (50 mg total) by mouth 2 (two) times daily. Take 50 mg by mouth 2 (two) times daily. 08/22/16   Tresa Garter, MD  Insulin Glargine (LANTUS SOLOSTAR) 100 UNIT/ML Solostar Pen Inject 30 Units into the skin every morning. And pen needles 1/day 08/22/16   Tresa Garter, MD  Lancet Devices [facility]) lancets Use as instructed for 3 times daily testing of blood glucose 09/06/16   Jegede, Olugbemiga E, MD  losartan (COZAAR) 25 MG tablet Take 1 tablet (25 mg total) by mouth daily. 12/19/16 03/19/17  Larey Dresser, MD  Magnesium 200 MG TABS Take 1 tablet (200 mg total) by mouth daily. 09/10/16   Sherran Needs, NP    Magnesium 250 MG TABS Take 250 mg by mouth daily.    [provider]  metFORMIN (GLUCOPHAGE) 1000 MG tablet Take 1 tablet (1,000 mg total) by mouth 2 (two) times daily with a meal. 08/22/16   Jegede, Marlena Clipper, MD  nitroGLYCERIN (NITROSTAT) 0.4 MG SL tablet Place 1 tablet (0.4 mg total) under the tongue every 5 (five) minutes as needed for chest pain (x 3 tabs daily). Reported on 08/18/2015 02/29/16   Larey Dresser, MD  potassium chloride SA (K-DUR,KLOR-CON) 20 MEQ tablet Take 20 mEq by mouth daily.    [provider]  sotalol (BETAPACE) 120 MG tablet Take 1 tablet (120 mg total) by mouth every 12 (twelve) hours. 08/22/16   Tresa Garter, MD  spironolactone (ALDACTONE) 25 MG tablet Take 1 tablet (25 mg total) by mouth at bedtime. 08/22/16   Tresa Garter, MD  TRUEPLUS LANCETS 28G MISC 1 each by Does not apply route 3 (three) times daily. Patient taking differently: 1 each by Other route daily.  08/22/16   Tresa Garter, MD  warfarin (COUMADIN) 5 MG tablet TAKE AS DIRECTED BY COUMADIN CLINIC Patient taking differently: TAKE 1/2 TABLET (2.5 MG) ON TUES, THURS, SAT AT BEDTIME, TAKE 1 TABLET (5 MG) ON SUN, MON, WED, FRI AT BEDTIME 05/25/16   Larey Dresser, MD    Family History Family History  Problem Relation Age of Onset  . Diabetes Mother   . Hypertension Mother   . Heart attack Neg Hx   . Stroke Neg Hx     Social History Social History  Substance Use Topics  . Smoking status: Former Smoker    Packs/day: 4.00    Years: 18.00    Types: Cigarettes    Quit date: 08/14/1983  . Smokeless tobacco: Never Used  . Alcohol use 9.6 oz/week    6 Cans of beer, 10 Shots of liquor per week     Comment: occasionally     Allergies   Ace inhibitors and Other   Review of Systems Review of Systems  Constitutional: Negative for fever.  HENT: Negative for trouble swallowing.        Mouth bleeding  Respiratory: Negative for shortness of breath.    Gastrointestinal: Negative for nausea and vomiting.  Neurological: Negative for dizziness and light-headedness.     Physical Exam Updated Vital Signs BP (!) 142/84 (BP Location: Left Arm)   Pulse 67   Temp 98.5 F (36.9 C) (  Oral)   Resp 18   SpO2 96%   Physical Exam  Constitutional: He appears well-developed and well-nourished. No distress.  HENT:  Head: Normocephalic and atraumatic.  No active intraoral bleeding noted. Minimal blood noted on gauze currently in the patient's mouth. Clots noted in the 3 extraction sites to the upper right.  Eyes: Conjunctivae are normal.  Neck: Normal range of motion. Neck supple.  Cardiovascular: Normal rate and regular rhythm.   Pulmonary/Chest: Effort normal.  Neurological: He is alert.  Skin: Skin is warm and dry. He is not diaphoretic.  Psychiatric: He has a normal mood and affect. His behavior is normal.  Nursing note and vitals reviewed.    ED Treatments / Results  Labs (all labs ordered are listed, but only abnormal results are displayed) Labs Reviewed  BASIC METABOLIC PANEL - Abnormal; Notable for the following:       Result Value   Glucose, Bld 107 (*)    All other components within normal limits  PROTIME-INR - Abnormal; Notable for the following:    Prothrombin Time 24.7 (*)    All other components within normal limits  CBC    EKG  EKG Interpretation None       Radiology No results found.  Procedures Procedures (including critical care time)  Medications Ordered in ED Medications - No data to display   Initial Impression / Assessment and Plan / ED Course  I have reviewed the triage vital signs and the nursing notes.  Pertinent labs & imaging results that were available during my care of the patient were reviewed by me and considered in my medical decision making (see chart for details).      Patient presents with oral bleeding. Controlled upon my evaluation. Patient stable during ED course. Hemoglobin  normal. Recommend dentist follow-up. Return precautions discussed.  Findings and plan of care discussed with Sherwood Gambler, MD.    Final Clinical Impressions(s) / ED Diagnoses   Final diagnoses:  Oral bleeding    New Prescriptions New Prescriptions   No medications on file     Layla Maw 01/22/17 0008    Sherwood Gambler, MD 01/23/17 [phone]

## 2017-01-22 NOTE — Discharge Instructions (Signed)
The bleeding seems to have resolved. Do not do anything to disrupt the clots in place. Follow-up with the dentist tomorrow.

## 2017-01-23 ENCOUNTER — Telehealth: Payer: Self-pay | Admitting: *Deleted

## 2017-01-23 NOTE — Telephone Encounter (Signed)
Informed patient of compliance results and patient understanding was verbalized. Patient understands he needs to improve his compliance. Patient explains he had some teeth pulled and has not been able to wear his unit for a few days but he will start back wearing his unit tonight. Patient understands he needs to be compliant so his insurance will continue to pay or they may ask him to private pay for his unit.

## 2017-01-23 NOTE — Telephone Encounter (Signed)
-----   Message from Sueanne Margarita, MD sent at 01/23/2017  2:18 PM EDT ----- Good AHI on CPAP but needs to improve compliance

## 2017-01-29 ENCOUNTER — Other Ambulatory Visit: Payer: Self-pay | Admitting: Internal Medicine

## 2017-01-29 ENCOUNTER — Other Ambulatory Visit: Payer: Self-pay | Admitting: Cardiology

## 2017-01-29 DIAGNOSIS — I25119 Atherosclerotic heart disease of native coronary artery with unspecified angina pectoris: Secondary | ICD-10-CM

## 2017-01-29 DIAGNOSIS — I10 Essential (primary) hypertension: Secondary | ICD-10-CM

## 2017-02-11 ENCOUNTER — Ambulatory Visit (INDEPENDENT_AMBULATORY_CARE_PROVIDER_SITE_OTHER): Payer: Medicare Other | Admitting: *Deleted

## 2017-02-11 DIAGNOSIS — Z5181 Encounter for therapeutic drug level monitoring: Secondary | ICD-10-CM | POA: Diagnosis not present

## 2017-02-11 LAB — POCT INR: INR: 2

## 2017-02-18 ENCOUNTER — Other Ambulatory Visit (HOSPITAL_COMMUNITY): Payer: Self-pay | Admitting: Cardiology

## 2017-02-18 MED ORDER — LOSARTAN POTASSIUM 25 MG PO TABS: 25.0000 mg | ORAL_TABLET | Freq: Every day | ORAL | 3 refills | Status: DC

## 2017-02-27 NOTE — Progress Notes (Signed)
Cardiology Office Note:    Date:  02/28/2017   ID:  [name] [name], DOB 10-29-1955, MRN [id number]  PCP:  Tresa Garter, MD  Cardiologist:  Loralie Champagne, MD   Referring MD: Tresa Garter, MD   Chief Complaint  Patient presents with  . Sleep Apnea  . Hypertension    History of Present Illness:    [name] [name] is a 61 y.o. male with a hx of  OSA who is here today for evaluation of OSA.  He is doing well with his CPAP device.  He has mild OSA with PSG showing an AHI of 5/hr but severe during REM sleep at 70/hr with O2 sats as low as 85% c/w nocturnal hypoxemia.  He is on CPAP at 10cm H2O. He is doing well with his CPAP device but has not been using it much. He says that he does not sleep very much and spends a lot of time watching TV.  He tolerates the full face mask and feels the pressure is adequate.  He naps during the day because he gets tired from not sleeping at night.  He some mouth dryness I the am. He does not think he snores when using the CPAP.    Past Medical History:  Diagnosis Date  . Anginal pain ([place])   . Anxiety   . Arrhythmia   . CAD (coronary artery disease), native coronary artery    a. Nonobstructive CAD by cath 2013 - diffuse distal and branch vessel CAD, no severe disease in the major coronaries, LV mild global hypokinesis, EF 45%. b. ETT-Sestamibi 5/14: EF 31%, small fixed inferior defect with no ischemia.  . Cholecystitis   . Chronic CHF ([place])    a. Mixed ICM/NICM (?EtOH). EF 35% in 2008. Echo 5/13: EF 60-65%, mod LVH, EF 45% on V gram in 12/2011. EF 12/2012: EF 50-55%, mild LVH, inferobasal HK, mild MR. ETT-Ses 5/14 EF 41%. Cardiac MRI 5/14: EF 44%, mild global HK, subepicardial delayed enhancement in nonspecific RV insertion pattern.  . COLONIC POLYPS, HX OF 12/30/2006  . Gout   . H/O atrial flutter 2007   a. Ablations in 2007, 2008.  [name] Heart murmur   . HEPATITIS B, CHRONIC 12/30/2006  . History of alcohol abuse   . History of hiatal  hernia   . History of medication noncompliance   . History of nuclear stress test    Myoview 3/17: + chest pain; EF 33%, downsloping ST depression 2, V4-6, LVH with strain, no ischemia on images; intermediate risk   . HYPERCHOLESTEROLEMIA 07/11/2010  . Hypertension   . Left sciatic nerve pain since 04/2015  . LIVER FUNCTION TESTS, ABNORMAL, HX OF 12/30/2006  . MITRAL REGURGITATION 12/30/2006  . 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 PAF (paroxysmal atrial fibrillation) ([facility])    On coumadin  . Rectal bleeding 12/18/2011   Scheduled for colonoscopy.    . Shortness of breath dyspnea    with excertion  . Sleep apnea    "suppose to send mask but they never did" (05/03/2015)  . Type II diabetes mellitus ([place])     Past Surgical History:  Procedure Laterality Date  . A FLUTTER ABLATION  2007, 2008   catheter ablation   . CARDIAC CATHETERIZATION N/A 05/03/2015   Procedure: Right/Left Heart Cath and Coronary Angiography;  Surgeon: Larey Dresser, MD;  Location: [facility];  Service: Cardiovascular;  Laterality: N/A;  . CARDIAC CATHETERIZATION N/A 05/03/2015   Procedure:  Coronary Stent Intervention;  Surgeon: Jettie Booze, MD;  Location: [facility];  Service: Cardiovascular;  Laterality: N/A;  . CARDIAC CATHETERIZATION N/A 11/04/2015   Procedure: Left Heart Cath and Coronary Angiography;  Surgeon: Larey Dresser, MD;  Location: [facility];  Service: Cardiovascular;  Laterality: N/A;  . CARDIOVERSION N/A 08/24/2015   Procedure: CARDIOVERSION;  Surgeon: Thayer Headings, MD;  Location: [facility];  Service: Cardiovascular;  Laterality: N/A;  . CHOLECYSTECTOMY N/A 02/07/2016   Procedure: LAPAROSCOPIC CHOLECYSTECTOMY WITH  INTRAOPERATIVE CHOLANGIOGRAM;  Surgeon: Greer Pickerel, MD;  Location: [facility];  Service: General;  Laterality: N/A;  . CORONARY ANGIOPLASTY  12/05/01  . LEFT HEART CATH AND CORONARY ANGIOGRAPHY N/A 12/28/2016   Procedure: Left Heart Cath and  Coronary Angiography;  Surgeon: Larey Dresser, MD;  Location: [facility];  Service: Cardiovascular;  Laterality: N/A;  . LEFT HEART CATHETERIZATION WITH CORONARY ANGIOGRAM N/A 12/19/2011   Procedure: LEFT HEART CATHETERIZATION WITH CORONARY ANGIOGRAM;  Surgeon: Larey Dresser, MD;  Location: [facility] CATH LAB;  Service: Cardiovascular;  Laterality: N/A;  . OSTEOCHONDROMA EXCISION Left 1972   "took bone tumor off my shoulder"    Current Medications: Current Meds  Medication Sig  . ACCU-CHEK SOFTCLIX LANCETS lancets Use as instructed for three times daily testing of blood glucose (Patient taking differently: 1 each by Other route daily. )  . acetaminophen (TYLENOL) 500 MG tablet Take 1,000 mg by mouth every 6 (six) hours as needed for headache (pain). Reported on 08/18/2015  . amLODipine (NORVASC) 5 MG tablet Take 1 tablet (5 mg total) by mouth daily.  [name] atorvastatin (LIPITOR) 80 MG tablet Take 1 tablet (80 mg total) by mouth daily.  . Blood Glucose Monitoring Suppl (ACCU-CHEK AVIVA PLUS) w/Device KIT Use as directed for 3 times daily testing of blood glucose (Patient taking differently: 1 strip by Other route daily. )  . carvedilol (COREG) 12.5 MG tablet Take 1.5 tablets (18.75 mg total) by mouth 2 (two) times daily with a meal. (Patient taking differently: Take 12.5 mg by mouth 2 (two) times daily with a meal. )  . carvedilol (COREG) 12.5 MG tablet TAKE ONE TABLET BY MOUTH TWICE DAILY WITH MEALS  . Cholecalciferol (VITAMIN D3) 2000 units TABS Take 2,000 Units by mouth daily.  . furosemide (LASIX) 20 MG tablet TAKE TWO TABLETS BY MOUTH IN THE MORNING, THEN TAKE ONE IN THE EVENING  . glucose blood (ACCU-CHEK AVIVA PLUS) test strip Use as instructed for 3 times daily testing of blood glucose (Patient taking differently: 1 each by Other route daily. )  . hydrALAZINE (APRESOLINE) 50 MG tablet Take 1 tablet (50 mg total) by mouth 2 (two) times daily. Take 50 mg by mouth 2 (two) times daily.  .  Insulin Glargine (LANTUS SOLOSTAR) 100 UNIT/ML Solostar Pen Inject 30 Units into the skin every morning. And pen needles 1/day  . Lancet Devices (ACCU-CHEK SOFTCLIX) lancets Use as instructed for 3 times daily testing of blood glucose  . losartan (COZAAR) 25 MG tablet Take 1 tablet (25 mg total) by mouth daily.  . Magnesium 200 MG TABS Take 1 tablet (200 mg total) by mouth daily.  . Magnesium 250 MG TABS Take 250 mg by mouth daily.  . metFORMIN (GLUCOPHAGE) 1000 MG tablet Take 1 tablet (1,000 mg total) by mouth 2 (two) times daily with a meal.  . nitroGLYCERIN (NITROSTAT) 0.4 MG SL tablet Place 1 tablet (0.4 mg total) under the tongue every 5 (five)  minutes as needed for chest pain (x 3 tabs daily). Reported on 08/18/2015  . potassium chloride SA (K-DUR,KLOR-CON) 20 MEQ tablet Take 20 mEq by mouth daily.  . sotalol (BETAPACE) 120 MG tablet Take 1 tablet (120 mg total) by mouth every 12 (twelve) hours.  [name] spironolactone (ALDACTONE) 25 MG tablet Take 1 tablet (25 mg total) by mouth at bedtime.  . TRUEPLUS LANCETS 28G MISC 1 each by Does not apply route 3 (three) times daily. (Patient taking differently: 1 each by Other route daily. )  . warfarin (COUMADIN) 5 MG tablet TAKE AS DIRECTED BY  COUMADIN  CLINIC     Allergies:   Ace inhibitors and Other   Social History   Social History  . Marital status: Widowed    Spouse name: N/A  . Number of children: 2  . Years of education: N/A   Occupational History  .  Unemployed   Social History Main Topics  . Smoking status: Former Smoker    Packs/day: 4.00    Years: 18.00    Types: Cigarettes    Quit date: 08/14/1983  . Smokeless tobacco: Never Used  . Alcohol use 9.6 oz/week    6 Cans of beer, 10 Shots of liquor per week     Comment: occasionally  . Drug use: Yes    Types: Marijuana     Comment: occasionally  . Sexual activity: No   Other Topics Concern  . None   Social History Narrative  . None     Family History: The patient's  family history includes Diabetes in his mother; Hypertension in his mother. There is no history of Heart attack or Stroke. ROS:   Please see the history of present illness.     All other systems reviewed and are negative.  EKGs/Labs/Other Studies Reviewed:    The following studies were reviewed today: CPAP titration  EKG:  EKG is not ordered today.    Recent Labs: 08/03/2016: ALT 14 09/10/2016: Magnesium 1.7 01/21/2017: BUN 14; Creatinine, Ser 1.21; Hemoglobin 13.1; Platelets 185; Potassium 3.8; Sodium 135  Recent Lipid Panel    Component Value Date/Time   CHOL 104 (L) 07/12/2015 0920   TRIG 78 07/12/2015 0920   HDL 44 07/12/2015 0920   CHOLHDL 2.4 07/12/2015 0920   VLDL 16 07/12/2015 0920   LDLCALC 44 07/12/2015 0920   LDLDIRECT 111.0 04/22/2015 1559    Physical Exam:    VS:  BP 122/60   Pulse 66   Ht 6' (1.829 m)   Wt 211 lb 3.2 oz (95.8 kg)   SpO2 96%   BMI 28.64 kg/m     Wt Readings from Last 3 Encounters:  02/28/17 211 lb 3.2 oz (95.8 kg)  12/28/16 214 lb (97.1 kg)  12/27/16 211 lb (95.7 kg)     GEN:  Well nourished, well developed in no acute distress HEENT: Normal NECK: No JVD; No carotid bruits LYMPHATICS: No lymphadenopathy CARDIAC: RRR, no murmurs, rubs, gallops RESPIRATORY:  Clear to auscultation without rales, wheezing or rhonchi  ABDOMEN: Soft, non-tender, non-distended MUSCULOSKELETAL:  No edema; No deformity  SKIN: Warm and dry NEUROLOGIC:  Alert and oriented x 3 PSYCHIATRIC:  Normal affect   ASSESSMENT:    1. OSA (obstructive sleep apnea)   2. Essential hypertension    PLAN:    In order of problems listed above:  OSA - the patient is tolerating PAP therapy well without any problems. The PAP download was reviewed today and showed an AHI of 4.4/hr  on 10 cm H2O with 37% compliance in using more than 4 hours nightly.  The patient has been using and benefiting from CPAP use and will continue to benefit from therapy. I have encouraged [name] to be  more compliant with his device. I encouraged [name] to go to bed when he is sleepy and not stay up watching TV.  2.  HTN - BP is well controlled on exam today.   He will continue on carvedilol, Cozaar, amlodipine, hydralazine and spironolactone.   3.  Overwieght- I have encouraged [name] to get into a routine exercise program and cut back on carbs and portions.     Medication Adjustments/Labs and Tests Ordered: Current medicines are reviewed at length with the patient today.  Concerns regarding medicines are outlined above.  No orders of the defined types were placed in this encounter.  No orders of the defined types were placed in this encounter.   Signed, [name] Him, MD  02/28/2017 8:19 AM    [place]

## 2017-02-28 ENCOUNTER — Encounter: Payer: Self-pay | Admitting: Cardiology

## 2017-02-28 ENCOUNTER — Ambulatory Visit (INDEPENDENT_AMBULATORY_CARE_PROVIDER_SITE_OTHER): Payer: Medicare Other | Admitting: Cardiology

## 2017-02-28 ENCOUNTER — Encounter (INDEPENDENT_AMBULATORY_CARE_PROVIDER_SITE_OTHER): Payer: Self-pay

## 2017-02-28 VITALS — BP 122/60 | HR 66 | Ht 72.0 in | Wt 211.2 lb

## 2017-02-28 DIAGNOSIS — I10 Essential (primary) hypertension: Secondary | ICD-10-CM

## 2017-02-28 DIAGNOSIS — E663 Overweight: Secondary | ICD-10-CM

## 2017-02-28 DIAGNOSIS — G4733 Obstructive sleep apnea (adult) (pediatric): Secondary | ICD-10-CM | POA: Diagnosis not present

## 2017-02-28 NOTE — Patient Instructions (Signed)

## 2017-03-21 ENCOUNTER — Telehealth: Payer: Self-pay | Admitting: Cardiology

## 2017-03-21 NOTE — Telephone Encounter (Signed)
New message   Patient calling need to speak with nurse - CPAP machine.

## 2017-03-22 NOTE — Telephone Encounter (Signed)
Patient called today stating he has been out of town caring for his sick elderly mother and upon returning he got a message from [facility] that he had not met compliance and had the option to private pay or return his cpap machine. Patient said he decided to call our office and speak to the cpap assistant before turning his machine in. The cpap assistant called [facility] patient referral (Rodney) to inquire if caring for an elderly sick family member out of town was an exception but Rodney informed me that the patient's machine is mobile so the patient could have taken the machine with him. Patient states he is willing to keep his machine and be in compliance going forward but he can not privately pay for it. CPAP assistant reached out to [facility] (Rodney) to inquire what the next steps would be if the patient turned in his machine. Rodney explained the patient would have to start over with a sleep study and the patient is not willing to go through that again at this time.Rodney ([facility]) states the patient is outside his 90 day window is scheduled for private pay very soon. Patient has been notified and states he will turn his machine in today 03/22/17. 

## 2017-03-23 NOTE — Telephone Encounter (Signed)
Please find out if he would qualify for the patient assistance program to get a used machine

## 2017-03-26 ENCOUNTER — Other Ambulatory Visit: Payer: Self-pay

## 2017-03-26 ENCOUNTER — Encounter (HOSPITAL_COMMUNITY): Payer: Self-pay | Admitting: Cardiology

## 2017-03-26 ENCOUNTER — Ambulatory Visit (HOSPITAL_COMMUNITY)
Admission: RE | Admit: 2017-03-26 | Discharge: 2017-03-26 | Disposition: A | Discharge status: Home / Self Care | Payer: Medicare Other | Source: Ambulatory Visit | Attending: Cardiology | Admitting: Cardiology

## 2017-03-26 VITALS — BP 148/86 | HR 74 | Wt 215.4 lb

## 2017-03-26 DIAGNOSIS — I480 Paroxysmal atrial fibrillation: Secondary | ICD-10-CM

## 2017-03-26 DIAGNOSIS — I129 Hypertensive chronic kidney disease with stage 1 through stage 4 chronic kidney disease, or unspecified chronic kidney disease: Secondary | ICD-10-CM | POA: Insufficient documentation

## 2017-03-26 DIAGNOSIS — Z87891 Personal history of nicotine dependence: Secondary | ICD-10-CM | POA: Insufficient documentation

## 2017-03-26 DIAGNOSIS — I2510 Atherosclerotic heart disease of native coronary artery without angina pectoris: Secondary | ICD-10-CM | POA: Insufficient documentation

## 2017-03-26 DIAGNOSIS — E1122 Type 2 diabetes mellitus with diabetic chronic kidney disease: Secondary | ICD-10-CM | POA: Insufficient documentation

## 2017-03-26 DIAGNOSIS — Z9049 Acquired absence of other specified parts of digestive tract: Secondary | ICD-10-CM | POA: Diagnosis not present

## 2017-03-26 DIAGNOSIS — E1165 Type 2 diabetes mellitus with hyperglycemia: Secondary | ICD-10-CM | POA: Insufficient documentation

## 2017-03-26 DIAGNOSIS — E7800 Pure hypercholesterolemia, unspecified: Secondary | ICD-10-CM | POA: Insufficient documentation

## 2017-03-26 DIAGNOSIS — Z79899 Other long term (current) drug therapy: Secondary | ICD-10-CM | POA: Insufficient documentation

## 2017-03-26 DIAGNOSIS — I5032 Chronic diastolic (congestive) heart failure: Secondary | ICD-10-CM | POA: Diagnosis not present

## 2017-03-26 DIAGNOSIS — Z7901 Long term (current) use of anticoagulants: Secondary | ICD-10-CM | POA: Diagnosis not present

## 2017-03-26 DIAGNOSIS — N189 Chronic kidney disease, unspecified: Secondary | ICD-10-CM | POA: Diagnosis not present

## 2017-03-26 DIAGNOSIS — I10 Essential (primary) hypertension: Secondary | ICD-10-CM | POA: Diagnosis not present

## 2017-03-26 DIAGNOSIS — I429 Cardiomyopathy, unspecified: Secondary | ICD-10-CM | POA: Diagnosis not present

## 2017-03-26 DIAGNOSIS — G4733 Obstructive sleep apnea (adult) (pediatric): Secondary | ICD-10-CM

## 2017-03-26 DIAGNOSIS — Z794 Long term (current) use of insulin: Secondary | ICD-10-CM | POA: Diagnosis not present

## 2017-03-26 DIAGNOSIS — Z955 Presence of coronary angioplasty implant and graft: Secondary | ICD-10-CM | POA: Insufficient documentation

## 2017-03-26 LAB — BASIC METABOLIC PANEL
Anion gap: 7 (ref 5–15)
BUN: 10 mg/dL (ref 6–20)
CALCIUM: 9.1 mg/dL (ref 8.9–10.3)
CO2: 25 mmol/L (ref 22–32)
CREATININE: 1.07 mg/dL (ref 0.61–1.24)
Chloride: 105 mmol/L (ref 101–111)
GFR calc Af Amer: >60 mL/min (ref >60)
GFR calc non Af Amer: >60 mL/min (ref >60)
GLUCOSE: 104 mg/dL — AB (ref 65–99)
Potassium: 3.8 mmol/L (ref 3.5–5.1)
Sodium: 137 mmol/L (ref 135–145)

## 2017-03-26 LAB — CBC
HEMATOCRIT: 37.7 % — AB (ref 39.0–52.0)
Hemoglobin: 12.6 g/dL — ABNORMAL LOW (ref 13.0–17.0)
MCH: 26.8 pg (ref 26.0–34.0)
MCHC: 33.4 g/dL (ref 30.0–36.0)
MCV: 80 fL (ref 78.0–100.0)
PLATELETS: 189 10*3/uL (ref 150–400)
RBC: 4.71 MIL/uL (ref 4.22–5.81)
RDW: 16 % — AB (ref 11.5–15.5)
WBC: 6.2 10*3/uL (ref 4.0–10.5)

## 2017-03-26 LAB — LIPID PANEL
Cholesterol: 95 mg/dL (ref 0–200)
HDL: 43 mg/dL (ref >40)
LDL CALC: 39 mg/dL (ref 0–99)
Total CHOL/HDL Ratio: 2.2 RATIO
Triglycerides: 63 mg/dL (ref <150)
VLDL: 13 mg/dL (ref 0–40)

## 2017-03-26 NOTE — Telephone Encounter (Signed)
Patient had an office visit with his cardiologist today (Dr Aundra Dubin) and the doctor has referred him for another sleep study so he can get back on his cpap.

## 2017-03-26 NOTE — Telephone Encounter (Signed)
ok 

## 2017-03-26 NOTE — Patient Instructions (Signed)
EKG today.  Routine lab work today. Will notify you of abnormal results, otherwise no news is good news!  Will schedule you for sleep study at [facility]. Address: [address], [place], [place] [address] Phone: [phone]  ___________________________________________________  ___________________________________________________  Follow up 4 months with Dr. Aundra Dubin.  Take all medication as prescribed the day of your appointment. Bring all medications with you to your appointment.  Do the following things EVERYDAY: 1) Weigh yourself in the morning before breakfast. Write it down and keep it in a log. 2) Take your medicines as prescribed 3) Eat low salt foods-Limit salt (sodium) to 2000 mg per day.  4) Stay as active as you can everyday 5) Limit all fluids for the day to less than 2 liters

## 2017-03-27 ENCOUNTER — Other Ambulatory Visit: Payer: Self-pay | Admitting: Internal Medicine

## 2017-03-27 DIAGNOSIS — I10 Essential (primary) hypertension: Secondary | ICD-10-CM

## 2017-03-27 NOTE — Progress Notes (Signed)
Patient ID: [name] [name], male   DOB: Dec 30, 1955, 61 y.o.   MRN: [id number] PCP: Dr. Doreene Burke Cardiology: Dr. Aundra Dubin  61 yo with history of HTN, diabetes, paroxysmal atrial fibrillation, and CAD presents for cardiology followup.  He was admitted in 5/13 with chest pain with exertion.  LHC was done showing global HK with EF 45% and diffuse, severe distal and branch vessel disease.  There was no interventional option, but I suspect that this disease could be causing his anginal-type pain.  Echo at that time was read as showing EF 60-65%.  He was admitted in 5/14 with hypertensive urgency and chest pain.  He ruled out for MI and BP was controlled.  His EF was 50-55% by echo.  ETT-Sestamibi done as outpatient showed small fixed inferior defect with no ischemia but EF was 31%.  He was quite hypertensive during the study.  Cardiac MRI done to confirm EF in 5/14 showed EF 44%.  He had an episode of atrial fibrillation/RVR in 3/15 but went back into NSR.  Echo in 2/16 showed EF 55-60% with moderate diastolic dysfunction.   He had a Cardiolite in 9/16 showing inferior ischemia.  He was having exertional dyspnea with less activity than normal.  I was concerned for worsened CAD, so took him for [facility] in 9/16. By RHC, filling pressures were not significantly elevated. He had a severe distal RCA stenosis that was treated with DES x 2.  Echo showed EF 55-60%.    In 10/16, he was admitted with acute cholecystitis.  He received a cholecystostomy tube.  Cholecystectomy done in 6/17.   Atrial fibrillation in 1/17, cardioverted to NSR.   Recurrent concerning chest pain in 3/17.  Repeat LHC showed diffuse distal and branch vessel disease consistent with poorly-controlled DM, but patent RCA stents.    He was admitted with atrial fibrillation in 12/17.  Started on sotalol and converted to NSR.    He had recurrent chest pain in 5/18 with abnormal Cardiolite.  Cath was done again in 5/18, showing diffuse CAD with no good  interventional options. He was given a prescription for Imdur but does not appear to have ever started it.   Today, he remains in NSR.  No exertional chest pain.  Blood glucose is under better control.  BP high today but he has not taken his meds yet.  He checks BP daily at home, and SBP is almost always < 130.  He is short of breath after walking about 100 yards.  No claudication. He did not use CPAP and his machine was taken back. Weight is stable.   ECG (personally reviewed): NSR, 1st degree AVB, QTc 447 msec  Labs (5/13): K 3.6, creatinine 0.93, LDL 85, HDL 57 Labs (7/13): K 3.9, creatinine 1.1, LDL 57, HDL 51  Labs (5/14): K 4, creatinine 1.25 Labs (8/14): K 3.7, creatinine 0.9, LDL 103, HDL 42 Labs (8/15): K 3.5 creatinine 1.0, BNP 75 Labs (2/16): K 3.8, creatinine 1.1 Labs (9/16): K 3.5, creatinine 1.03, BNP 59, HDL 40, LDL 111, TGs 333 Labs (11/16): K 3.6, creaitnine 1.07, LFTs normal, LDL 44 Labs (6/17): K 3.4, creatinine 1.52 => 1.13 Labs (1/18): K 3.9, creatinine 1.25 Labs (6/18): K 3.8, creatinine 1.21, HCT 39.7  PMH: 1. DM2 2. Gout 3. HTN: Cough with ACEI.  4. Cardiomyopathy: Suspect mixed ischemic/nonischemic (?ETOH-related).  EF 35% in 2008.  Echo in 5/13 showed EF 60-65% with moderate LVH but EF was 45% on LV-gram in 5/13.  Echo (5/14)  with EF 50-55%, mild LVH, inferobasal HK, mild MR.  ETT-Sestamibi in 5/14, however, showed EF 31%.  Cardiac MRI (6/14) with EF 44%, mild global hypokinesis, subepicardial delayed enhancement in a nonspecific RV insertion site pattern.  Echo (2/16) with EF 55-60%, moderate diastolic dysfunction. RHC (9/16) with mean RA 8, PA 39/20 mean 28, mean PCWP 14, CI 2.2. Echo (9/16) with EF 55-60%, grade II diastolic dysfunction.  - Echo (6/17) with EF 55-60%, moderate LVH, grade II diastolic dysfunction, mild AI, mild MR.  5. CAD: LHC in 2010 with mild nonobstructive disease.  LHC (5/13) with diffuse distal and branch vessel disease, mild global  hypokinesis and EF 45%.  ETT-Sestamibi (5/14): EF 31%, small fixed inferior defect with no ischemia.  Lexiscan Cardiolite (9/16) with EF 26%, moderate inferior defect that was partially reversible, suggesting ischemia => High risk study. LHC (9/16) with 40-50% mLAD, diffuse up to 50% distal LAD, 90% ostium of branch off ramus, 95% dRCA => DES to RCA x 2, EF 55-60%.   - LHC (3/17) with diffuse branch and distal vessel disease c/w poorly controlled DM, RCA stents patent.  - LHC (5/18): Diffuse CAD with no good interventional option. 60% mRCA, 90% dLAD, 50% serial mid ramus stenoses, ramus branches with ostial 80-90% stenoses.  6. H/o chronic HBV 7. Osteochondroma left shoulder.  8. Hyperlipidemia 9. Paroxysmal atrial fibrillation: Coumadin.  Developed cough and increased ESR with amiodarone.  DCCV in 1/17.  Recurrent atrial fibrillation in 12/17, sotalol started.  10. Atrial flutter: had ablations in 2007 and 2008.  11. OSA on CPAP.  12. Acute cholecystitis (10/16): Cholecystostomy tube placed.  Cholecystectomy 6/17.  13. CKD  SH: Married, prior smoker.  Some ETOH, occasionally heavy in past.  Does use occasional marijuana. Out of work Engineer, drilling. 2 daughters in grad school.   FH: CAD  ROS: All systems reviewed and negative except as per HPI.   Current Outpatient Prescriptions  Medication Sig Dispense Refill  . ACCU-CHEK SOFTCLIX LANCETS lancets Use as instructed for three times daily testing of blood glucose (Patient taking differently: 1 each by Other route daily. ) 100 each 12  . acetaminophen (TYLENOL) 500 MG tablet Take 1,000 mg by mouth every 6 (six) hours as needed for headache (pain). Reported on 08/18/2015    . amLODipine (NORVASC) 5 MG tablet Take 1 tablet (5 mg total) by mouth daily. 90 tablet 3  . atorvastatin (LIPITOR) 80 MG tablet Take 1 tablet (80 mg total) by mouth daily. 90 tablet 3  . Blood Glucose Monitoring Suppl (ACCU-CHEK AVIVA PLUS) w/Device KIT Use as directed for 3 times  daily testing of blood glucose (Patient taking differently: 1 strip by Other route daily. ) 1 kit 0  . carvedilol (COREG) 12.5 MG tablet TAKE ONE TABLET BY MOUTH TWICE DAILY WITH MEALS 180 tablet 0  . Cholecalciferol (VITAMIN D3) 2000 units TABS Take 2,000 Units by mouth daily.    . furosemide (LASIX) 20 MG tablet TAKE TWO TABLETS BY MOUTH IN THE MORNING, THEN TAKE ONE IN THE EVENING 90 tablet 6  . glucose blood (ACCU-CHEK AVIVA PLUS) test strip Use as instructed for 3 times daily testing of blood glucose (Patient taking differently: 1 each by Other route daily. ) 100 each 12  . hydrALAZINE (APRESOLINE) 50 MG tablet Take 1 tablet (50 mg total) by mouth 2 (two) times daily. Take 50 mg by mouth 2 (two) times daily. 180 tablet 3  . Insulin Glargine (LANTUS SOLOSTAR) 100 UNIT/ML Solostar Pen Inject 30  Units into the skin every morning. And pen needles 1/day 30 mL 3  . Lancet Devices (ACCU-CHEK SOFTCLIX) lancets Use as instructed for 3 times daily testing of blood glucose 1 each 0  . losartan (COZAAR) 25 MG tablet Take 1 tablet (25 mg total) by mouth daily. 90 tablet 3  . Magnesium 200 MG TABS Take 1 tablet (200 mg total) by mouth daily. 60 each   . metFORMIN (GLUCOPHAGE) 1000 MG tablet Take 1 tablet (1,000 mg total) by mouth 2 (two) times daily with a meal. 180 tablet 3  . potassium chloride SA (K-DUR,KLOR-CON) 20 MEQ tablet Take 20 mEq by mouth daily.    . sotalol (BETAPACE) 120 MG tablet Take 1 tablet (120 mg total) by mouth every 12 (twelve) hours. 180 tablet 3  . spironolactone (ALDACTONE) 25 MG tablet Take 1 tablet (25 mg total) by mouth at bedtime. 90 tablet 3  . TRUEPLUS LANCETS 28G MISC 1 each by Does not apply route 3 (three) times daily. (Patient taking differently: 1 each by Other route daily. ) 100 each 12  . warfarin (COUMADIN) 5 MG tablet TAKE AS DIRECTED BY  COUMADIN  CLINIC 30 tablet 1  . nitroGLYCERIN (NITROSTAT) 0.4 MG SL tablet Place 1 tablet (0.4 mg total) under the tongue every 5  (five) minutes as needed for chest pain (x 3 tabs daily). Reported on 08/18/2015 (Patient not taking: Reported on 03/26/2017) 100 tablet 1   No current facility-administered medications for this encounter.     BP (!) 148/86 (BP Location: Left Arm, Patient Position: Sitting, Cuff Size: Normal)   Pulse 74   Wt 215 lb 6.4 oz (97.7 kg)   SpO2 98%   BMI 29.21 kg/m  General: NAD Neck: No JVD, no thyromegaly or thyroid nodule.  Lungs: Clear to auscultation bilaterally with normal respiratory effort. CV: Nondisplaced PMI.  Heart regular S1/S2, no S3/S4, no murmur.  No peripheral edema.  No carotid bruit.  Normal pedal pulses.  Abdomen: Soft, nontender, no hepatosplenomegaly, no distention.  Skin: Intact without lesions or rashes.  Neurologic: Alert and oriented x 3.  Psych: Normal affect. Extremities: No clubbing or cyanosis.  HEENT: Normal.   Assessment/Plan:  Atrial fibrillation Paroxysmal atrial fibrillation. He is in NSR today.  - Continue warfarin, CBC today.  - Continue sotalol for maintenance of NSR.  Check BMET.  QTc ok on today's ECG.  Coronary artery disease LHC in 9/16 showed diffuse CAD, most significant involving the distal RCA.  Patient had DES x 2 to RCA.  Cath in 3/17 with patent RCA stents and diffuse distal and branch vessel disease concerning for poorly controlled diabetes.  Cath in 5/18 again showed diffuse CAD without good interventional target.   - Continue warfarin.  No ASA with stable CAD. - Continue atorvastatin 80 mg daily, check lipids today.  HYPERCHOLESTEROLEMIA  On atorvastatin, check lipids today.  Cardiomyopathy  EF 47% on cardiac MRI in 6/14 but EF improved back to 55-60% on 2/16 echo.  However, EF down to 26% on Cardiolite in 9/16.  I think this was inaccurate.  EF was 55-60% by both LV-gram and echo in 9/16 and 55-60% on 6/17 echo.  He looks euvolemic on exam.  - Continue Lasix 20 mg daily.   Hypertension BP high but did not take am meds.  Says SBP < 130  at home.   OSA Did not use CPAP and it was sent back. Willing to try again but will need new sleep study.  I  will arrange.   CKD BMET today.   Followup in 4 months.   Loralie Champagne 03/27/2017

## 2017-03-28 ENCOUNTER — Telehealth: Payer: Self-pay

## 2017-03-28 ENCOUNTER — Ambulatory Visit (INDEPENDENT_AMBULATORY_CARE_PROVIDER_SITE_OTHER): Payer: Medicare Other | Admitting: *Deleted

## 2017-03-28 DIAGNOSIS — Z5181 Encounter for therapeutic drug level monitoring: Secondary | ICD-10-CM | POA: Diagnosis not present

## 2017-03-28 LAB — POCT INR: INR: 1.9

## 2017-03-28 MED ORDER — HYDRALAZINE HCL 50 MG PO TABS: 50.0000 mg | ORAL_TABLET | Freq: Two times a day (BID) | ORAL | 0 refills | Status: DC

## 2017-03-28 NOTE — Telephone Encounter (Signed)
Called patient back, instructed him to contact his [facility] for instructions. Patient is on his way for an INR check now and he will ask for instructions once there.

## 2017-03-28 NOTE — Telephone Encounter (Signed)
Pt contacted the office and stated he is suppose to having some teeth removed and he is on the coumadin and he needs to know how long he needs to be off until the procedure. Pt states his procedure is schedule for April 05, 2017 could please follow up

## 2017-03-29 ENCOUNTER — Encounter (HOSPITAL_COMMUNITY): Payer: Self-pay | Admitting: *Deleted

## 2017-03-29 ENCOUNTER — Telehealth: Payer: Self-pay | Admitting: Pharmacist

## 2017-03-29 NOTE — Telephone Encounter (Signed)
Spoke to patient and scheduled appt for day before procedure to have INR checked.

## 2017-03-29 NOTE — Telephone Encounter (Signed)
Received fax from [facility] that 3 extractions scheduled for 04/05/17. Per their note will need INR 2.6 or below.   Will need to check INR day before procedure. Unable to reach patient to schedule. Will try again.

## 2017-03-29 NOTE — Telephone Encounter (Signed)
Follow up   Pt is returning call    

## 2017-04-04 ENCOUNTER — Ambulatory Visit (INDEPENDENT_AMBULATORY_CARE_PROVIDER_SITE_OTHER): Payer: Medicare Other | Admitting: *Deleted

## 2017-04-04 DIAGNOSIS — Z5181 Encounter for therapeutic drug level monitoring: Secondary | ICD-10-CM | POA: Diagnosis not present

## 2017-04-04 LAB — POCT INR: INR: 2.5

## 2017-05-06 ENCOUNTER — Telehealth: Payer: Self-pay | Admitting: Internal Medicine

## 2017-05-06 ENCOUNTER — Other Ambulatory Visit: Payer: Self-pay

## 2017-05-06 DIAGNOSIS — I10 Essential (primary) hypertension: Secondary | ICD-10-CM

## 2017-05-06 MED ORDER — HYDRALAZINE HCL 50 MG PO TABS: 50.0000 mg | ORAL_TABLET | Freq: Two times a day (BID) | ORAL | 11 refills | Status: DC

## 2017-05-07 ENCOUNTER — Encounter (HOSPITAL_BASED_OUTPATIENT_CLINIC_OR_DEPARTMENT_OTHER): Payer: Medicare Other

## 2017-05-08 ENCOUNTER — Other Ambulatory Visit (HOSPITAL_COMMUNITY): Payer: Self-pay | Admitting: Cardiology

## 2017-05-08 DIAGNOSIS — I10 Essential (primary) hypertension: Secondary | ICD-10-CM

## 2017-05-08 MED ORDER — AMLODIPINE BESYLATE 5 MG PO TABS: 5.0000 mg | ORAL_TABLET | Freq: Every day | ORAL | 3 refills | Status: DC

## 2017-05-14 ENCOUNTER — Telehealth: Payer: Self-pay | Admitting: Cardiology

## 2017-05-14 ENCOUNTER — Other Ambulatory Visit (HOSPITAL_COMMUNITY): Payer: Self-pay | Admitting: *Deleted

## 2017-05-14 DIAGNOSIS — I10 Essential (primary) hypertension: Secondary | ICD-10-CM

## 2017-05-14 MED ORDER — HYDRALAZINE HCL 50 MG PO TABS: 50.0000 mg | ORAL_TABLET | Freq: Two times a day (BID) | ORAL | 11 refills | Status: DC

## 2017-05-14 NOTE — Telephone Encounter (Signed)
Patient had another office send in his refill today. Patient has not been seen in [place] office since january 2018

## 2017-05-14 NOTE — Telephone Encounter (Signed)
°*  STAT* If patient is at the pharmacy, call can be transferred to refill team.   1. Which medications need to be refilled? (please list name of each medication and dose if known) hydralazine 50mg    2. Which pharmacy/location (including street and city if local pharmacy) is medication to be sent to? [facility] on [address]   3. Do they need a 30 day or 90 day supply? [place]

## 2017-05-14 NOTE — Telephone Encounter (Signed)
Pt called and states that [facility] needs clarification on his Hydralazine medication. [facility] [address]. Please follow up.

## 2017-06-06 ENCOUNTER — Other Ambulatory Visit: Payer: Self-pay | Admitting: Internal Medicine

## 2017-06-06 ENCOUNTER — Other Ambulatory Visit: Payer: Self-pay | Admitting: Cardiology

## 2017-06-06 DIAGNOSIS — I25119 Atherosclerotic heart disease of native coronary artery with unspecified angina pectoris: Secondary | ICD-10-CM

## 2017-06-06 DIAGNOSIS — I10 Essential (primary) hypertension: Secondary | ICD-10-CM

## 2017-06-13 ENCOUNTER — Other Ambulatory Visit: Payer: Self-pay | Admitting: Internal Medicine

## 2017-06-13 DIAGNOSIS — I25119 Atherosclerotic heart disease of native coronary artery with unspecified angina pectoris: Secondary | ICD-10-CM

## 2017-06-20 ENCOUNTER — Ambulatory Visit (INDEPENDENT_AMBULATORY_CARE_PROVIDER_SITE_OTHER): Payer: Medicare Other | Admitting: Pharmacist

## 2017-06-20 DIAGNOSIS — Z5181 Encounter for therapeutic drug level monitoring: Secondary | ICD-10-CM | POA: Diagnosis not present

## 2017-06-20 DIAGNOSIS — I10 Essential (primary) hypertension: Secondary | ICD-10-CM

## 2017-06-20 LAB — POCT INR: INR: 1.5

## 2017-06-20 MED ORDER — WARFARIN SODIUM 5 MG PO TABS: ORAL_TABLET | ORAL | 0 refills | Status: DC

## 2017-06-24 ENCOUNTER — Encounter (HOSPITAL_BASED_OUTPATIENT_CLINIC_OR_DEPARTMENT_OTHER): Payer: Medicare Other

## 2017-07-26 ENCOUNTER — Encounter (HOSPITAL_COMMUNITY): Payer: Medicare Other | Admitting: Cardiology

## 2017-07-29 ENCOUNTER — Ambulatory Visit (HOSPITAL_COMMUNITY)
Admission: RE | Admit: 2017-07-29 | Discharge: 2017-07-29 | Disposition: A | Discharge status: Home / Self Care | Payer: Medicare Other | Source: Ambulatory Visit | Attending: Physician Assistant | Admitting: Physician Assistant

## 2017-07-29 ENCOUNTER — Ambulatory Visit: Payer: Medicare Other | Attending: Internal Medicine | Admitting: Physician Assistant

## 2017-07-29 ENCOUNTER — Other Ambulatory Visit: Payer: Self-pay

## 2017-07-29 VITALS — BP 154/91 | HR 70 | Temp 98.3°F | Resp 16 | Ht 72.0 in | Wt 210.0 lb

## 2017-07-29 DIAGNOSIS — E7800 Pure hypercholesterolemia, unspecified: Secondary | ICD-10-CM | POA: Insufficient documentation

## 2017-07-29 DIAGNOSIS — R51 Headache: Secondary | ICD-10-CM | POA: Diagnosis not present

## 2017-07-29 DIAGNOSIS — I110 Hypertensive heart disease with heart failure: Secondary | ICD-10-CM | POA: Diagnosis not present

## 2017-07-29 DIAGNOSIS — Z5181 Encounter for therapeutic drug level monitoring: Secondary | ICD-10-CM | POA: Diagnosis not present

## 2017-07-29 DIAGNOSIS — Z794 Long term (current) use of insulin: Secondary | ICD-10-CM | POA: Diagnosis not present

## 2017-07-29 DIAGNOSIS — E0800 Diabetes mellitus due to underlying condition with hyperosmolarity without nonketotic hyperglycemic-hyperosmolar coma (NKHHC): Secondary | ICD-10-CM | POA: Diagnosis not present

## 2017-07-29 DIAGNOSIS — I509 Heart failure, unspecified: Secondary | ICD-10-CM | POA: Diagnosis not present

## 2017-07-29 DIAGNOSIS — I429 Cardiomyopathy, unspecified: Secondary | ICD-10-CM | POA: Diagnosis not present

## 2017-07-29 DIAGNOSIS — H0520 Unspecified exophthalmos: Secondary | ICD-10-CM | POA: Diagnosis not present

## 2017-07-29 DIAGNOSIS — R519 Headache, unspecified: Secondary | ICD-10-CM

## 2017-07-29 DIAGNOSIS — Z7901 Long term (current) use of anticoagulants: Secondary | ICD-10-CM

## 2017-07-29 DIAGNOSIS — Z79899 Other long term (current) drug therapy: Secondary | ICD-10-CM | POA: Insufficient documentation

## 2017-07-29 DIAGNOSIS — E119 Type 2 diabetes mellitus without complications: Secondary | ICD-10-CM | POA: Insufficient documentation

## 2017-07-29 DIAGNOSIS — M109 Gout, unspecified: Secondary | ICD-10-CM | POA: Diagnosis not present

## 2017-07-29 DIAGNOSIS — Z888 Allergy status to other drugs, medicaments and biological substances status: Secondary | ICD-10-CM | POA: Insufficient documentation

## 2017-07-29 DIAGNOSIS — I2510 Atherosclerotic heart disease of native coronary artery without angina pectoris: Secondary | ICD-10-CM | POA: Diagnosis not present

## 2017-07-29 DIAGNOSIS — I480 Paroxysmal atrial fibrillation: Secondary | ICD-10-CM | POA: Insufficient documentation

## 2017-07-29 DIAGNOSIS — G4730 Sleep apnea, unspecified: Secondary | ICD-10-CM | POA: Insufficient documentation

## 2017-07-29 DIAGNOSIS — I4892 Unspecified atrial flutter: Secondary | ICD-10-CM | POA: Diagnosis not present

## 2017-07-29 DIAGNOSIS — Z7902 Long term (current) use of antithrombotics/antiplatelets: Secondary | ICD-10-CM | POA: Insufficient documentation

## 2017-07-29 DIAGNOSIS — Z86010 Personal history of colonic polyps: Secondary | ICD-10-CM | POA: Diagnosis not present

## 2017-07-29 DIAGNOSIS — E870 Hyperosmolality and hypernatremia: Secondary | ICD-10-CM | POA: Diagnosis not present

## 2017-07-29 DIAGNOSIS — R079 Chest pain, unspecified: Secondary | ICD-10-CM | POA: Diagnosis not present

## 2017-07-29 DIAGNOSIS — I10 Essential (primary) hypertension: Secondary | ICD-10-CM | POA: Diagnosis not present

## 2017-07-29 LAB — POCT INR: INR: 2.4

## 2017-07-29 LAB — POCT GLYCOSYLATED HEMOGLOBIN (HGB A1C): Hemoglobin A1C: 7

## 2017-07-29 LAB — GLUCOSE, POCT (MANUAL RESULT ENTRY): POC GLUCOSE: 181 mg/dL — AB (ref 70–99)

## 2017-07-29 NOTE — Progress Notes (Signed)
Patient ID: [name] [name], male   DOB: [birth year], 61 y.o.   MRN: [id number]     [name] [name], is a 61 y.o. male  [id number]:[id number]  TWK:[id number]  DOB - 10-24-1955  Subjective:  Chief Complaint and HPI: [name] [name] is a 61 y.o. male here today tfor Pain in L back of head, L neck and L arm X 5 days.  No f/c.  Occasional cough.  Some pain started today in mid chest.  Mild in nature.  Tylenol partially relieved pain. No SOB. Missed last INR check.  NKI.  He did not notice any neurological issues or weakness.  No vision changes.   He has been able to do normal activities daily.    Blood sugars running mostly in 100s when he takes his meds, but he has missed some meds and blood sugars have gone higher.  Has also missed some BP doses.    ROS:   Constitutional:  No f/c, No night sweats, No unexplained weight loss. EENT:  No vision changes, No blurry vision, No hearing changes. No mouth, throat, or ear problems.  Respiratory: occasional cough, No SOB Cardiac: mild CP, no palpitations GI:  No abd pain, No N/V/D. GU: No Urinary s/sx Musculoskeletal: No joint pain Neuro: + headache, no dizziness, no motor weakness.  Skin: No rash Endocrine:  No polydipsia. No polyuria.  Psych: Denies SI/HI  No problems updated.  ALLERGIES: Allergies  Allergen Reactions  . Ace Inhibitors Cough  . Other Hives and Other (See Comments)    Patient reports developing hives after receiving "some antibiotic given in 1980''s at [facility]". He does not know which antibiotic.    PAST MEDICAL HISTORY: Past Medical History:  Diagnosis Date  . Anginal pain ([place])   . Anxiety   . Arrhythmia   . CAD (coronary artery disease), native coronary artery    a. Nonobstructive CAD by cath 2013 - diffuse distal and branch vessel CAD, no severe disease in the major coronaries, LV mild global hypokinesis, EF 45%. b. ETT-Sestamibi 5/14: EF 31%, small fixed inferior defect with no ischemia.  . Cholecystitis    . Chronic CHF ([place])    a. Mixed ICM/NICM (?EtOH). EF 35% in 2008. Echo 5/13: EF 60-65%, mod LVH, EF 45% on V gram in 12/2011. EF 12/2012: EF 50-55%, mild LVH, inferobasal HK, mild MR. ETT-Ses 5/14 EF 41%. Cardiac MRI 5/14: EF 44%, mild global HK, subepicardial delayed enhancement in nonspecific RV insertion pattern.  . COLONIC POLYPS, HX OF 12/30/2006  . Gout   . H/O atrial flutter 2007   a. Ablations in 2007, 2008.  [name] Heart murmur   . HEPATITIS B, CHRONIC 12/30/2006  . History of alcohol abuse   . History of hiatal hernia   . History of medication noncompliance   . History of nuclear stress test    Myoview 3/17: + chest pain; EF 33%, downsloping ST depression 2, V4-6, LVH with strain, no ischemia on images; intermediate risk   . HYPERCHOLESTEROLEMIA 07/11/2010  . Hypertension   . Left sciatic nerve pain since 04/2015  . LIVER FUNCTION TESTS, ABNORMAL, HX OF 12/30/2006  . MITRAL REGURGITATION 12/30/2006  . 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 PAF (paroxysmal atrial fibrillation) (HCC)    On coumadin  . Rectal bleeding 12/18/2011   Scheduled for colonoscopy.    . Shortness of breath dyspnea    with excertion  . Sleep apnea    "suppose to send mask but they never  did" (05/03/2015)  . Type II diabetes mellitus ([place])     MEDICATIONS AT HOME: Prior to Admission medications   Medication Sig Start Date End Date Taking? Authorizing Provider  acetaminophen (TYLENOL) 500 MG tablet Take 1,000 mg by mouth every 6 (six) hours as needed for headache (pain). Reported on 08/18/2015   Yes [provider]  amLODipine (NORVASC) 5 MG tablet Take 1 tablet (5 mg total) by mouth daily. 05/08/17  Yes Larey Dresser, MD  atorvastatin (LIPITOR) 80 MG tablet Take 1 tablet (80 mg total) by mouth daily. 12/19/16  Yes Larey Dresser, MD  carvedilol (COREG) 12.5 MG tablet TAKE 1 TABLET BY MOUTH TWICE DAILY WITH MEALS 06/13/17  Yes Jegede, Olugbemiga E, MD  furosemide (LASIX) 20 MG tablet TAKE  TWO TABLETS BY MOUTH IN THE MORNING, THEN TAKE ONE IN THE EVENING 12/19/16  Yes Larey Dresser, MD  hydrALAZINE (APRESOLINE) 50 MG tablet Take 1 tablet (50 mg total) by mouth 2 (two) times daily. 05/14/17  Yes Larey Dresser, MD  Insulin Glargine (LANTUS SOLOSTAR) 100 UNIT/ML Solostar Pen Inject 30 Units into the skin every morning. And pen needles 1/day 08/22/16  Yes Tresa Garter, MD  Lancet Devices [facility]) lancets Use as instructed for 3 times daily testing of blood glucose 09/06/16  Yes Jegede, Olugbemiga E, MD  metFORMIN (GLUCOPHAGE) 1000 MG tablet Take 1 tablet (1,000 mg total) by mouth 2 (two) times daily with a meal. 08/22/16  Yes Jegede, Olugbemiga E, MD  sotalol (BETAPACE) 120 MG tablet Take 1 tablet (120 mg total) by mouth every 12 (twelve) hours. 08/22/16  Yes Tresa Garter, MD  spironolactone (ALDACTONE) 25 MG tablet Take 1 tablet (25 mg total) by mouth at bedtime. 08/22/16  Yes Tresa Garter, MD  warfarin (COUMADIN) 5 MG tablet Take 1/2 to 1 tablet daily as directed by coumadin clinic 06/20/17  Yes Larey Dresser, MD  ACCU-CHEK SOFTCLIX LANCETS lancets Use as instructed for three times daily testing of blood glucose Patient taking differently: 1 each by Other route daily.  09/06/16   Tresa Garter, MD  Blood Glucose Monitoring Suppl (ACCU-CHEK AVIVA PLUS) w/Device KIT Use as directed for 3 times daily testing of blood glucose Patient taking differently: 1 strip by Other route daily.  09/06/16   Tresa Garter, MD  Cholecalciferol (VITAMIN D3) 2000 units TABS Take 2,000 Units by mouth daily.    [provider]  glucose blood (ACCU-CHEK AVIVA PLUS) test strip Use as instructed for 3 times daily testing of blood glucose Patient taking differently: 1 each by Other route daily.  09/06/16   Tresa Garter, MD  losartan (COZAAR) 25 MG tablet Take 1 tablet (25 mg total) by mouth daily. 02/18/17 05/19/17  Bensimhon, Shaune Pascal, MD  Magnesium 200  MG TABS Take 1 tablet (200 mg total) by mouth daily. Patient not taking: Reported on 07/29/2017 09/10/16   Sherran Needs, NP  nitroGLYCERIN (NITROSTAT) 0.4 MG SL tablet Place 1 tablet (0.4 mg total) under the tongue every 5 (five) minutes as needed for chest pain (x 3 tabs daily). Reported on 08/18/2015 Patient not taking: Reported on 03/26/2017 02/29/16   Larey Dresser, MD  potassium chloride SA (K-DUR,KLOR-CON) 20 MEQ tablet Take 20 mEq by mouth daily.    [provider]  TRUEPLUS LANCETS 28G MISC 1 each by Does not apply route 3 (three) times daily. Patient taking differently: 1 each by Other route daily.  08/22/16  Tresa Garter, MD     Objective:  EXAM:   Vitals:   07/29/17 0934  BP: (!) 154/91  Pulse: 70  Resp: 16  Temp: 98.3 F (36.8 C)  TempSrc: Oral  SpO2: 98%  Weight: 210 lb (95.3 kg)  Height: 6' (1.829 m)    General appearance : A&OX3. NAD. Non-toxic-appearing,  L scalp is slightly TTP HEENT: Atraumatic and Normocephalic.  PERRLA. EOM intact. Fundi benign.  He does have B cataracts.   TM clear B. Mouth-MMM, post pharynx WNL w/o erythema, + PND. + nasal congestion Neck: supple, no JVD. No cervical lymphadenopathy. No thyromegaly Chest/Lungs:  Breathing-non-labored, Good air entry bilaterally, breath sounds normal without rales, rhonchi, or wheezing  CVS: S1 S2 regular, no murmurs, gallops, rubs  Extremities: Bilateral Lower Ext shows no edema, both legs are warm to touch with = pulse throughout Neurology:  CN II-XII grossly intact, Non focal. Facial symmetry intact; no new asymmetry   Psych:  TP linear. J/I WNL. Normal speech. Appropriate eye contact and affect.  Skin:  No Rash; no shingles  Data Review Lab Results  Component Value Date   HGBA1C 7.0 07/29/2017   HGBA1C 10.7 (H) 12/19/2016   HGBA1C 9.7 08/22/2016     Assessment & Plan  Discussed case and reviewed EKG and data with Dr Doreene Burke 1. Nonintractable headache, unspecified chronicity  pattern, unspecified headache type - CT Head Wo Contrast; Future - CBC with Differential/Platelet  2. Chest pain, unspecified type No acute ischemia on EKG.  Reviewed old EKG and today's EKG with Dr Doreene Burke  3. Diabetes mellitus due to underlying condition with hyperosmolarity without coma, without long-term current use of insulin (HCC) suboptimal control but should improve with improved compliance-for now continue current regimen but adhere to current regimen!!!  Discussed/counselled at length on this.   - HgB A1c - Glucose (CBG) - Comprehensive metabolic panel - CBC with Differential/Platelet  4. Anticoagulation goal of INR 2 to 3 Therapeutic range-keep f/up coumadin clinic and continue current dose.   - INR  5. Essential hypertension Not at goal, but not always taking meds as prescribed.  Increase compliance.  We have discussed target BP range and blood pressure goal. I have advised patient to check BP regularly and to call us back or report to clinic if the numbers are consistently higher than 140/90. We discussed the importance of compliance with medical therapy and DASH diet recommended, consequences of uncontrolled hypertension discussed.    Patient have been counseled extensively about nutrition and exercise  Return in about 1 month (around 08/29/2017) for Dr Doreene Burke DM and other medical issues. . Call 911 if any s/sx worsen.   The patient was given clear instructions to go to ER or return to medical center if symptoms don't improve, worsen or new problems develop. The patient verbalized understanding. The patient was told to call to get lab results if they haven't heard anything in the next week.     Freeman Caldron, PA-C [facility] and [place], [place]   07/29/2017, 10:40 AM

## 2017-07-29 NOTE — Progress Notes (Signed)
Pain on left side of head affecting left eye and ROM Left arm, constricting movement. Concern with nodule in right arm  losartan

## 2017-07-29 NOTE — Progress Notes (Signed)
Radiology note stated hold if abnormal. Let patient go home. Attempted to call CT results to referring office multiple times and could not get through. Nothing urgent.

## 2017-07-30 LAB — CBC WITH DIFFERENTIAL/PLATELET
BASOS: 1 %
Basophils Absolute: 0.1 10*3/uL (ref 0.0–0.2)
EOS (ABSOLUTE): 0.4 10*3/uL (ref 0.0–0.4)
EOS: 6 %
HEMATOCRIT: 44.2 % (ref 37.5–51.0)
Hemoglobin: 14 g/dL (ref 13.0–17.7)
IMMATURE GRANS (ABS): 0 10*3/uL (ref 0.0–0.1)
IMMATURE GRANULOCYTES: 1 %
LYMPHS: 35 %
Lymphocytes Absolute: 2.2 10*3/uL (ref 0.7–3.1)
MCH: 26.3 pg — ABNORMAL LOW (ref 26.6–33.0)
MCHC: 31.7 g/dL (ref 31.5–35.7)
MCV: 83 fL (ref 79–97)
MONOS ABS: 0.7 10*3/uL (ref 0.1–0.9)
Monocytes: 11 %
NEUTROS PCT: 46 %
Neutrophils Absolute: 2.9 10*3/uL (ref 1.4–7.0)
PLATELETS: 187 10*3/uL (ref 150–379)
RBC: 5.33 x10E6/uL (ref 4.14–5.80)
RDW: 16.8 % — AB (ref 12.3–15.4)
WBC: 6.2 10*3/uL (ref 3.4–10.8)

## 2017-07-30 LAB — COMPREHENSIVE METABOLIC PANEL
ALK PHOS: 57 IU/L (ref 39–117)
ALT: 12 IU/L (ref 0–44)
AST: 15 IU/L (ref 0–40)
Albumin/Globulin Ratio: 1.4 (ref 1.2–2.2)
Albumin: 4.3 g/dL (ref 3.6–4.8)
BUN/Creatinine Ratio: 9 — ABNORMAL LOW (ref 10–24)
BUN: 11 mg/dL (ref 8–27)
Bilirubin Total: 0.4 mg/dL (ref 0.0–1.2)
CALCIUM: 9.6 mg/dL (ref 8.6–10.2)
CO2: 23 mmol/L (ref 20–29)
CREATININE: 1.25 mg/dL (ref 0.76–1.27)
Chloride: 106 mmol/L (ref 96–106)
GFR calc Af Amer: 70 mL/min/{1.73_m2} (ref >59)
GFR, EST NON AFRICAN AMERICAN: 61 mL/min/{1.73_m2} (ref >59)
GLUCOSE: 147 mg/dL — AB (ref 65–99)
Globulin, Total: 3 g/dL (ref 1.5–4.5)
Potassium: 4.4 mmol/L (ref 3.5–5.2)
SODIUM: 142 mmol/L (ref 134–144)
Total Protein: 7.3 g/dL (ref 6.0–8.5)

## 2017-08-01 ENCOUNTER — Telehealth: Payer: Self-pay | Admitting: *Deleted

## 2017-08-01 NOTE — Telephone Encounter (Signed)
Please call patient. His CT scan of his head was normal and did not show any stroke currently. He should follow-up as planned. If he has any worsening of symptoms or new concerning symptoms, he should go to the ED, but so far, everything from today has checked out ok!  Thanks,  Please call patient. His labs are good other than his sugar being a little elevated as we discussed. Follow-up as planned. Thanks,   Patient name and DOB verified. Pt aware of results and result message.

## 2017-08-14 ENCOUNTER — Other Ambulatory Visit: Payer: Self-pay | Admitting: Cardiology

## 2017-08-14 DIAGNOSIS — I5032 Chronic diastolic (congestive) heart failure: Secondary | ICD-10-CM

## 2017-08-14 DIAGNOSIS — I480 Paroxysmal atrial fibrillation: Secondary | ICD-10-CM

## 2017-08-14 DIAGNOSIS — I2510 Atherosclerotic heart disease of native coronary artery without angina pectoris: Secondary | ICD-10-CM

## 2017-09-04 ENCOUNTER — Ambulatory Visit: Payer: Medicare Other | Admitting: Internal Medicine

## 2017-09-10 ENCOUNTER — Ambulatory Visit (INDEPENDENT_AMBULATORY_CARE_PROVIDER_SITE_OTHER): Payer: Medicare Other | Admitting: *Deleted

## 2017-09-10 ENCOUNTER — Telehealth (HOSPITAL_COMMUNITY): Payer: Self-pay | Admitting: *Deleted

## 2017-09-10 ENCOUNTER — Other Ambulatory Visit (HOSPITAL_COMMUNITY): Payer: Self-pay | Admitting: *Deleted

## 2017-09-10 ENCOUNTER — Other Ambulatory Visit: Payer: Self-pay | Admitting: *Deleted

## 2017-09-10 DIAGNOSIS — I4819 Other persistent atrial fibrillation: Secondary | ICD-10-CM

## 2017-09-10 DIAGNOSIS — I059 Rheumatic mitral valve disease, unspecified: Secondary | ICD-10-CM | POA: Diagnosis not present

## 2017-09-10 DIAGNOSIS — Z5181 Encounter for therapeutic drug level monitoring: Secondary | ICD-10-CM

## 2017-09-10 DIAGNOSIS — I481 Persistent atrial fibrillation: Secondary | ICD-10-CM | POA: Diagnosis not present

## 2017-09-10 DIAGNOSIS — I10 Essential (primary) hypertension: Secondary | ICD-10-CM

## 2017-09-10 DIAGNOSIS — I4892 Unspecified atrial flutter: Secondary | ICD-10-CM | POA: Diagnosis not present

## 2017-09-10 LAB — POCT INR: INR: 4.3

## 2017-09-10 MED ORDER — WARFARIN SODIUM 5 MG PO TABS: ORAL_TABLET | ORAL | 0 refills | Status: DC

## 2017-09-10 NOTE — Patient Instructions (Signed)
Description   Do not take coumadin today Jan 29th and on Jan 30th take 1/2 tablets then continue 1 tablet every day except 1/2 tablet on Tuesdays, Thursdays and Saturdays.  Recheck INR in 2 weeks.

## 2017-09-10 NOTE — Telephone Encounter (Signed)
Patient called to clarify his carvedilol prescription.  Patient was previously on 12.5 mg BID but most recent prescription filled for 25 mg BID.  Patient has been taking this dose for the past month and feels fine.  I spoke with Dr Aundra Dubin about the medication error and he stated patient may continue taking 25 mg BID since he is feeling good.    Patient also overdue to follow up appointment, when speaking with patient to continue taking 25 mg BID of carvedilol we went ahead and scheduled him for follow up with Dr. Aundra Dubin.

## 2017-09-12 MED ORDER — HYDRALAZINE HCL 50 MG PO TABS: 50.0000 mg | ORAL_TABLET | Freq: Two times a day (BID) | ORAL | 11 refills | Status: DC

## 2017-09-18 ENCOUNTER — Ambulatory Visit: Payer: Medicare Other | Attending: Internal Medicine | Admitting: Internal Medicine

## 2017-09-18 ENCOUNTER — Encounter: Payer: Self-pay | Admitting: Internal Medicine

## 2017-09-18 VITALS — BP 135/84 | HR 66 | Temp 98.3°F | Resp 16 | Wt 215.8 lb

## 2017-09-18 DIAGNOSIS — I10 Essential (primary) hypertension: Secondary | ICD-10-CM | POA: Diagnosis not present

## 2017-09-18 DIAGNOSIS — E119 Type 2 diabetes mellitus without complications: Secondary | ICD-10-CM | POA: Insufficient documentation

## 2017-09-18 DIAGNOSIS — I5032 Chronic diastolic (congestive) heart failure: Secondary | ICD-10-CM | POA: Diagnosis not present

## 2017-09-18 DIAGNOSIS — Z7901 Long term (current) use of anticoagulants: Secondary | ICD-10-CM | POA: Diagnosis not present

## 2017-09-18 DIAGNOSIS — I110 Hypertensive heart disease with heart failure: Secondary | ICD-10-CM | POA: Diagnosis not present

## 2017-09-18 DIAGNOSIS — Z87891 Personal history of nicotine dependence: Secondary | ICD-10-CM | POA: Insufficient documentation

## 2017-09-18 DIAGNOSIS — Z794 Long term (current) use of insulin: Secondary | ICD-10-CM | POA: Insufficient documentation

## 2017-09-18 DIAGNOSIS — Z9889 Other specified postprocedural states: Secondary | ICD-10-CM | POA: Diagnosis not present

## 2017-09-18 DIAGNOSIS — Z79899 Other long term (current) drug therapy: Secondary | ICD-10-CM | POA: Diagnosis not present

## 2017-09-18 DIAGNOSIS — E785 Hyperlipidemia, unspecified: Secondary | ICD-10-CM

## 2017-09-18 DIAGNOSIS — Z9049 Acquired absence of other specified parts of digestive tract: Secondary | ICD-10-CM | POA: Diagnosis not present

## 2017-09-18 DIAGNOSIS — G4733 Obstructive sleep apnea (adult) (pediatric): Secondary | ICD-10-CM | POA: Diagnosis not present

## 2017-09-18 LAB — GLUCOSE, POCT (MANUAL RESULT ENTRY): POC Glucose: 171 mg/dl — AB (ref 70–99)

## 2017-09-18 NOTE — Progress Notes (Signed)
 Subjective:  Patient ID: [name] [name], male    DOB: 01/20/1956  Age: 62 y.o. MRN: [id number]  CC: Diabetes  HPI Jerryl T Magid is a 62 y/o African American male with medical hx of type 2 DM, HTN, Atrial fibrillation, CAD, OSA, and CHF. He presents today for DM follow-up.   DM: Patient reports intermittent compliance with medication regimen. Reports glucose level ranges from 120's to 500's at home. He often misses his insulin dose at night. Denies polyuria, polydipsia, polyphagia, paresthesia, weakness, or loss of sensation. Reports intermittent blurred vision. He has an Ophthalmology appointment schedule on 10/02/17. His Podiatry appointment was within the last year.   HTN: Patient compliant with mediation regimen. Denies chest pain, sob, dyspnea on exertion, palpitation, dizziness, orthopnea, peripheral edema, recurrent headaches, or claudication in lower extremities.   Past Medical History:  Diagnosis Date  . Anginal pain (HCC)   . Anxiety   . Arrhythmia   . CAD (coronary artery disease), native coronary artery    a. Nonobstructive CAD by cath 2013 - diffuse distal and branch vessel CAD, no severe disease in the major coronaries, LV mild global hypokinesis, EF 45%. b. ETT-Sestamibi 5/14: EF 31%, small fixed inferior defect with no ischemia.  . Cholecystitis   . Chronic CHF (HCC)    a. Mixed ICM/NICM (?EtOH). EF 35% in 2008. Echo 5/13: EF 60-65%, mod LVH, EF 45% on V gram in 12/2011. EF 12/2012: EF 50-55%, mild LVH, inferobasal HK, mild MR. ETT-Ses 5/14 EF 41%. Cardiac MRI 5/14: EF 44%, mild global HK, subepicardial delayed enhancement in nonspecific RV insertion pattern.  . COLONIC POLYPS, HX OF 12/30/2006  . Gout   . H/O atrial flutter 2007   a. Ablations in 2007, 2008.  . Heart murmur   . HEPATITIS B, CHRONIC 12/30/2006  . History of alcohol abuse   . History of hiatal hernia   . History of medication noncompliance   . History of nuclear stress test    Myoview 3/17: + chest  pain; EF 33%, downsloping ST depression 2, V4-6, LVH with strain, no ischemia on images; intermediate risk   . HYPERCHOLESTEROLEMIA 07/11/2010  . Hypertension   . Left sciatic nerve pain since 04/2015  . LIVER FUNCTION TESTS, ABNORMAL, HX OF 12/30/2006  . MITRAL REGURGITATION 12/30/2006  . Osteochondrosarcoma (HCC) 1972   "left shoulder"  . PAF (paroxysmal atrial fibrillation) (HCC)    On coumadin  . Rectal bleeding 12/18/2011   Scheduled for colonoscopy.    . Shortness of breath dyspnea    with excertion  . Sleep apnea    "suppose to send mask but they never did" (05/03/2015)  . Type II diabetes mellitus (HCC)    Patient Active Problem List   Diagnosis Date Noted  . Overweight (BMI 25.0-29.9) 02/28/2017  . Unstable angina (HCC) 08/03/2016  . S/P laparoscopic cholecystectomy 02/07/2016  . Coronary artery disease involving native coronary artery with unstable angina pectoris (HCC) 12/29/2015  . Diabetes (HCC) 11/12/2015  . Chronic anticoagulation 08/22/2015  . Atrial flutter with controlled response (HCC) 08/22/2015  . RUQ pain   . Cholelithiases- drain in place 06/04/2015  . Essential hypertension   . Chronic diastolic CHF (congestive heart failure) (HCC) 04/24/2015  . Excessive daytime sleepiness 11/25/2014  . OSA (obstructive sleep apnea) 11/25/2014  . NICM (nonischemic cardiomyopathy) (HCC) 03/11/2014  . Anticoagulation goal of INR 2 to 3 10/19/2013  . Eczema 10/19/2013  . Low libido 10/16/2013  . Persistent atrial fibrillation (HCC) 10/06/2013  .   Encounter for therapeutic drug monitoring 09/10/2013  . Rectal bleeding 12/18/2011  . HYPERCHOLESTEROLEMIA 07/11/2010  . DENTAL CARIES 06/30/2010  . GOUT 05/05/2009  . HEPATITIS B, CHRONIC 12/30/2006  . OSTEOCHONDROMA 12/30/2006  . Mitral valve disorder 12/30/2006  . LIVER FUNCTION TESTS, ABNORMAL, HX OF 12/30/2006  . COLONIC POLYPS, HX OF 12/30/2006  . COLONOSCOPY, HX OF 05/12/2001   Past Surgical History:  Procedure  Laterality Date  . A FLUTTER ABLATION  2007, 2008   catheter ablation   . CARDIAC CATHETERIZATION N/A 05/03/2015   Procedure: Right/Left Heart Cath and Coronary Angiography;  Surgeon: Larey Dresser, MD;  Location: [facility];  Service: Cardiovascular;  Laterality: N/A;  . CARDIAC CATHETERIZATION N/A 05/03/2015   Procedure: Coronary Stent Intervention;  Surgeon: Jettie Booze, MD;  Location: [place] [facility];  Service: Cardiovascular;  Laterality: N/A;  . CARDIAC CATHETERIZATION N/A 11/04/2015   Procedure: Left Heart Cath and Coronary Angiography;  Surgeon: Larey Dresser, MD;  Location: [place] [facility];  Service: Cardiovascular;  Laterality: N/A;  . CARDIOVERSION N/A 08/24/2015   Procedure: CARDIOVERSION;  Surgeon: Thayer Headings, MD;  Location[facility];  Service: Cardiovascular;  Laterality: N/A;  . CHOLECYSTECTOMY N/A 02/07/2016   Procedure: LAPAROSCOPIC CHOLECYSTECTOMY WITH  INTRAOPERATIVE CHOLANGIOGRAM;  Surgeon: Greer Pickerel, MD;  Location: [facility];  Service: General;  Laterality: N/A;  . CORONARY ANGIOPLASTY  12/05/01  . LEFT HEART CATH AND CORONARY ANGIOGRAPHY N/A 12/28/2016   Procedure: Left Heart Cath and Coronary Angiography;  Surgeon: Larey Dresser, MD;  Location: [place] [facility];  Service: Cardiovascular;  Laterality: N/A;  . LEFT HEART CATHETERIZATION WITH CORONARY ANGIOGRAM N/A 12/19/2011   Procedure: LEFT HEART CATHETERIZATION WITH CORONARY ANGIOGRAM;  Surgeon: Larey Dresser, MD;  Location: [facility];  Service: Cardiovascular;  Laterality: N/A;  . OSTEOCHONDROMA EXCISION Left 1972   "took bone tumor off my shoulder"   Social History   Socioeconomic History  . Marital status: Widowed    Spouse name: Not on file  . Number of children: 2  . Years of education: Not on file  . Highest education level: Not on file  Social Needs  . Financial resource strain: Not on file  . Food insecurity - worry: Not on file  . Food insecurity - inability: Not on  file  . Transportation needs - medical: Not on file  . Transportation needs - non-medical: Not on file  Occupational History    Employer: UNEMPLOYED  Tobacco Use  . Smoking status: Former Smoker    Packs/day: 4.00    Years: 18.00    Pack years: 72.00    Types: Cigarettes    Last attempt to quit: 08/14/1983    Years since quitting: 34.1  . Smokeless tobacco: Never Used  Substance and Sexual Activity  . Alcohol use: Yes    Alcohol/week: 9.6 oz    Types: 6 Cans of beer, 10 Shots of liquor per week    Comment: occasionally  . Drug use: Yes    Types: Marijuana    Comment: occasionally  . Sexual activity: No  Other Topics Concern  . Not on file  Social History Narrative  . Not on file   Outpatient Medications Prior to Visit  Medication Sig Dispense Refill  . ACCU-CHEK SOFTCLIX LANCETS lancets Use as instructed for three times daily testing of blood glucose (Patient taking differently: 1 each by Other route daily. ) 100 each 12  . acetaminophen (TYLENOL) 500 MG tablet  Take 1,000 mg by mouth every 6 (six) hours as needed for headache (pain). Reported on 08/18/2015    . amLODipine (NORVASC) 5 MG tablet Take 1 tablet (5 mg total) by mouth daily. 90 tablet 3  . atorvastatin (LIPITOR) 80 MG tablet Take 1 tablet (80 mg total) by mouth daily. 90 tablet 3  . Blood Glucose Monitoring Suppl (ACCU-CHEK AVIVA PLUS) w/Device KIT Use as directed for 3 times daily testing of blood glucose (Patient taking differently: 1 strip by Other route daily. ) 1 kit 0  . carvedilol (COREG) 25 MG tablet TAKE ONE TABLET BY MOUTH TWICE DAILY WITH MEALS 60 tablet 6  . Cholecalciferol (VITAMIN D3) 2000 units TABS Take 2,000 Units by mouth daily.    . furosemide (LASIX) 20 MG tablet TAKE TWO TABLETS BY MOUTH IN THE MORNING, THEN TAKE ONE IN THE EVENING 90 tablet 6  . glucose blood (ACCU-CHEK AVIVA PLUS) test strip Use as instructed for 3 times daily testing of blood glucose (Patient taking differently: 1 each by Other  route daily. ) 100 each 12  . hydrALAZINE (APRESOLINE) 50 MG tablet Take 1 tablet (50 mg total) by mouth 2 (two) times daily. 30 tablet 11  . Insulin Glargine (LANTUS SOLOSTAR) 100 UNIT/ML Solostar Pen Inject 30 Units into the skin every morning. And pen needles 1/day 30 mL 3  . Lancet Devices (ACCU-CHEK SOFTCLIX) lancets Use as instructed for 3 times daily testing of blood glucose 1 each 0  . losartan (COZAAR) 25 MG tablet Take 1 tablet (25 mg total) by mouth daily. 90 tablet 3  . Magnesium 200 MG TABS Take 1 tablet (200 mg total) by mouth daily. (Patient not taking: Reported on 07/29/2017) 60 each   . metFORMIN (GLUCOPHAGE) 1000 MG tablet Take 1 tablet (1,000 mg total) by mouth 2 (two) times daily with a meal. 180 tablet 3  . nitroGLYCERIN (NITROSTAT) 0.4 MG SL tablet Place 1 tablet (0.4 mg total) under the tongue every 5 (five) minutes as needed for chest pain (x 3 tabs daily). Reported on 08/18/2015 (Patient not taking: Reported on 03/26/2017) 100 tablet 1  . potassium chloride SA (K-DUR,KLOR-CON) 20 MEQ tablet Take 20 mEq by mouth daily.    . sotalol (BETAPACE) 120 MG tablet Take 1 tablet (120 mg total) by mouth every 12 (twelve) hours. 180 tablet 3  . spironolactone (ALDACTONE) 25 MG tablet Take 1 tablet (25 mg total) by mouth at bedtime. 90 tablet 3  . TRUEPLUS LANCETS 28G MISC 1 each by Does not apply route 3 (three) times daily. (Patient taking differently: 1 each by Other route daily. ) 100 each 12  . warfarin (COUMADIN) 5 MG tablet Take as directed by coumadin clinic 35 tablet 0   No facility-administered medications prior to visit.    Allergies  Allergen Reactions  . Ace Inhibitors Cough  . Other Hives and Other (See Comments)    Patient reports developing hives after receiving "some antibiotic given in 1980''s at [facility]". He does not know which antibiotic.   ROS Review of Systems  Constitutional: Negative for activity change, appetite change, fatigue and unexpected  weight change.  Eyes: Positive for visual disturbance.  Respiratory: Negative for cough, chest tightness and shortness of breath.   Cardiovascular: Negative for chest pain, palpitations and leg swelling.  Gastrointestinal: Negative for abdominal distention, abdominal pain, constipation, diarrhea, nausea and vomiting.  Endocrine: Negative for polydipsia, polyphagia and polyuria.  Musculoskeletal: Negative for arthralgias and myalgias.  Skin: Negative for   color change, rash and wound.  Neurological: Negative for dizziness, tremors, syncope, weakness, light-headedness, numbness and headaches.  Psychiatric/Behavioral: Negative for confusion, decreased concentration, dysphoric mood and sleep disturbance. The patient is not nervous/anxious.   All other systems reviewed and are negative.  Objective:  BP 135/84   Pulse 66   Temp 98.3 F (36.8 C) (Oral)   Resp 16   Wt 215 lb 12.8 oz (97.9 kg)   SpO2 100%   BMI 29.27 kg/m   BP/Weight 09/18/2017 07/29/2017 03/26/2017  Systolic BP 135 154 148  Diastolic BP 84 91 86  Wt. (Lbs) 215.8 210 215.4  BMI 29.27 28.48 29.21   Lab Results  Component Value Date   POCGLU 171 (A) 09/18/2017   Lab Results  Component Value Date   HGBA1C 7.0 07/29/2017   Physical Exam  Constitutional: He is oriented to person, place, and time. He appears well-developed and well-nourished. No distress.  HENT:  Head: Normocephalic.  Eyes: Conjunctivae and EOM are normal.  Neck: Normal range of motion. Neck supple. No JVD present. Carotid bruit is not present.  Cardiovascular: Normal rate, regular rhythm and normal heart sounds. Exam reveals no gallop and no friction rub.  No murmur heard. Pulses:      Radial pulses are 2+ on the right side, and 2+ on the left side.       Dorsalis pedis pulses are 2+ on the right side, and 2+ on the left side.  Pulmonary/Chest: Effort normal and breath sounds normal. No respiratory distress.  Abdominal: Soft. Bowel sounds are normal.  He exhibits no distension. There is no tenderness.  Musculoskeletal: Normal range of motion. He exhibits no edema.  Neurological: He is alert and oriented to person, place, and time. He has normal strength and normal reflexes. No sensory deficit. He exhibits normal muscle tone.  Skin: Skin is warm, dry and intact. No rash noted.  Psychiatric: He has a normal mood and affect. His behavior is normal. Judgment and thought content normal.  Nursing note and vitals reviewed.  Assessment & Plan:   1. Type 2 diabetes mellitus without complication, with long-term current use of insulin (HCC), controlled  - Continue medication regimen. Instructed to not miss dosages.  - Instructed to maintain a low-carb, low-sugar, and low-salt diet. - Instructed to start 150 min/week exercise regimen.  - Follow-up in 3 months. Repeat A1c. - POCT glucose (manual entry) - Microalbumin / creatinine urine ratio - Ambulatory referral to diabetic education  2. Essential hypertension, controlled - Continue medication regimen.  - Instructed to maintain a low-carb, low-sugar, and low-salt diet. - Instructed to start 150 min/week exercise regimen.  - Follow-up in 3 months.   3. Dyslipidemia, controlled - Continue medication regimen. - Instructed to maintain a low-carb, low-sugar, and low-salt diet. - Instructed to start 150 min/week exercise regimen.  - Follow-up in 3 months.   No orders of the defined types were placed in this encounter.   Follow-up: Return in about 3 months (around 12/16/2017) for Hemoglobin A1C and Follow up, DM, Follow up HTN.   Krystle H. Dove, AGDNP-student   Evaluation and management procedures were performed by me with DNP Student in attendance, note written by DNP student under my supervision and collaboration. I have reviewed the note and I agree with the management and plan.   Olugbemiga Jegede, MD, MHA, CPE, FACP, FAAP [facility] [place],  [place] [phone]   09/19/2017, 3:50 PM 

## 2017-09-18 NOTE — Patient Instructions (Signed)
Diabetes Mellitus and Nutrition When you have diabetes (diabetes mellitus), it is very important to have healthy eating habits because your blood sugar (glucose) levels are greatly affected by what you eat and drink. Eating healthy foods in the appropriate amounts, at about the same times every day, can help you:  Control your blood glucose.  Lower your risk of heart disease.  Improve your blood pressure.  Reach or maintain a healthy weight.  Every person with diabetes is different, and each person has different needs for a meal plan. Your health care provider may recommend that you work with a diet and nutrition specialist (dietitian) to make a meal plan that is best for you. Your meal plan may vary depending on factors such as:  The calories you need.  The medicines you take.  Your weight.  Your blood glucose, blood pressure, and cholesterol levels.  Your activity level.  Other health conditions you have, such as heart or kidney disease.  How do carbohydrates affect me? Carbohydrates affect your blood glucose level more than any other type of food. Eating carbohydrates naturally increases the amount of glucose in your blood. Carbohydrate counting is a method for keeping track of how many carbohydrates you eat. Counting carbohydrates is important to keep your blood glucose at a healthy level, especially if you use insulin or take certain oral diabetes medicines. It is important to know how many carbohydrates you can safely have in each meal. This is different for every person. Your dietitian can help you calculate how many carbohydrates you should have at each meal and for snack. Foods that contain carbohydrates include:  Bread, cereal, rice, pasta, and crackers.  Potatoes and corn.  Peas, beans, and lentils.  Milk and yogurt.  Fruit and juice.  Desserts, such as cakes, cookies, ice cream, and candy.  How does alcohol affect me? Alcohol can cause a sudden decrease in blood  glucose (hypoglycemia), especially if you use insulin or take certain oral diabetes medicines. Hypoglycemia can be a life-threatening condition. Symptoms of hypoglycemia (sleepiness, dizziness, and confusion) are similar to symptoms of having too much alcohol. If your health care provider says that alcohol is safe for you, follow these guidelines:  Limit alcohol intake to no more than 1 drink per day for nonpregnant women and 2 drinks per day for men. One drink equals 12 oz of beer, 5 oz of wine, or 1 oz of hard liquor.  Do not drink on an empty stomach.  Keep yourself hydrated with water, diet soda, or unsweetened iced tea.  Keep in mind that regular soda, juice, and other mixers may contain a lot of sugar and must be counted as carbohydrates.  What are tips for following this plan? Reading food labels  Start by checking the serving size on the label. The amount of calories, carbohydrates, fats, and other nutrients listed on the label are based on one serving of the food. Many foods contain more than one serving per package.  Check the total grams (g) of carbohydrates in one serving. You can calculate the number of servings of carbohydrates in one serving by dividing the total carbohydrates by 15. For example, if a food has 30 g of total carbohydrates, it would be equal to 2 servings of carbohydrates.  Check the number of grams (g) of saturated and trans fats in one serving. Choose foods that have low or no amount of these fats.  Check the number of milligrams (mg) of sodium in one serving. Most people   should limit total sodium intake to less than 2,300 mg per day.  Always check the nutrition information of foods labeled as "low-fat" or "nonfat". These foods may be higher in added sugar or refined carbohydrates and should be avoided.  Talk to your dietitian to identify your daily goals for nutrients listed on the label. Shopping  Avoid buying canned, premade, or processed foods. These  foods tend to be high in fat, sodium, and added sugar.  Shop around the outside edge of the grocery store. This includes fresh fruits and vegetables, bulk grains, fresh meats, and fresh dairy. Cooking  Use low-heat cooking methods, such as baking, instead of high-heat cooking methods like deep frying.  Cook using healthy oils, such as olive, canola, or sunflower oil.  Avoid cooking with butter, cream, or high-fat meats. Meal planning  Eat meals and snacks regularly, preferably at the same times every day. Avoid going long periods of time without eating.  Eat foods high in fiber, such as fresh fruits, vegetables, beans, and whole grains. Talk to your dietitian about how many servings of carbohydrates you can eat at each meal.  Eat 4-6 ounces of lean protein each day, such as lean meat, chicken, fish, eggs, or tofu. 1 ounce is equal to 1 ounce of meat, chicken, or fish, 1 egg, or 1/4 cup of tofu.  Eat some foods each day that contain healthy fats, such as avocado, nuts, seeds, and fish. Lifestyle   Check your blood glucose regularly.  Exercise at least 30 minutes 5 or more days each week, or as told by your health care provider.  Take medicines as told by your health care provider.  Do not use any products that contain nicotine or tobacco, such as cigarettes and e-cigarettes. If you need help quitting, ask your health care provider.  Work with a counselor or diabetes educator to identify strategies to manage stress and any emotional and social challenges. What are some questions to ask my health care provider?  Do I need to meet with a diabetes educator?  Do I need to meet with a dietitian?  What number can I call if I have questions?  When are the best times to check my blood glucose? Where to find more information:  American Diabetes Association: diabetes.org/food-and-fitness/food  Academy of Nutrition and Dietetics:  www.eatright.org/resources/health/diseases-and-conditions/diabetes  National Institute of Diabetes and Digestive and Kidney Diseases (NIH): www.niddk.nih.gov/health-information/diabetes/overview/diet-eating-physical-activity Summary  A healthy meal plan will help you control your blood glucose and maintain a healthy lifestyle.  Working with a diet and nutrition specialist (dietitian) can help you make a meal plan that is best for you.  Keep in mind that carbohydrates and alcohol have immediate effects on your blood glucose levels. It is important to count carbohydrates and to use alcohol carefully. This information is not intended to replace advice given to you by your health care provider. Make sure you discuss any questions you have with your health care provider. Document Released: 04/26/2005 Document Revised: 09/03/2016 Document Reviewed: 09/03/2016 Elsevier Interactive Patient Education  2018 [employer]. Diabetes Mellitus and Exercise Exercising regularly is important for your overall health, especially when you have diabetes (diabetes mellitus). Exercising is not only about losing weight. It has many health benefits, such as increasing muscle strength and bone density and reducing body fat and stress. This leads to improved fitness, flexibility, and endurance, all of which result in better overall health. Exercise has additional benefits for people with diabetes, including:  Reducing appetite.  Helping to lower   and control blood glucose.  Lowering blood pressure.  Helping to control amounts of fatty substances (lipids) in the blood, such as cholesterol and triglycerides.  Helping the body to respond better to insulin (improving insulin sensitivity).  Reducing how much insulin the body needs.  Decreasing the risk for heart disease by: ? Lowering cholesterol and triglyceride levels. ? Increasing the levels of good cholesterol. ? Lowering blood glucose levels.  What is my  activity plan? Your health care provider or certified diabetes educator can help you make a plan for the type and frequency of exercise (activity plan) that works for you. Make sure that you:  Do at least 150 minutes of moderate-intensity or vigorous-intensity exercise each week. This could be brisk walking, biking, or water aerobics. ? Do stretching and strength exercises, such as yoga or weightlifting, at least 2 times a week. ? Spread out your activity over at least 3 days of the week.  Get some form of physical activity every day. ? Do not go more than 2 days in a row without some kind of physical activity. ? Avoid being inactive for more than 90 minutes at a time. Take frequent breaks to walk or stretch.  Choose a type of exercise or activity that you enjoy, and set realistic goals.  Start slowly, and gradually increase the intensity of your exercise over time.  What do I need to know about managing my diabetes?  Check your blood glucose before and after exercising. ? If your blood glucose is higher than 240 mg/dL (13.3 mmol/L) before you exercise, check your urine for ketones. If you have ketones in your urine, do not exercise until your blood glucose returns to normal.  Know the symptoms of low blood glucose (hypoglycemia) and how to treat it. Your risk for hypoglycemia increases during and after exercise. Common symptoms of hypoglycemia can include: ? Hunger. ? Anxiety. ? Sweating and feeling clammy. ? Confusion. ? Dizziness or feeling light-headed. ? Increased heart rate or palpitations. ? Blurry vision. ? Tingling or numbness around the mouth, lips, or tongue. ? Tremors or shakes. ? Irritability.  Keep a rapid-acting carbohydrate snack available before, during, and after exercise to help prevent or treat hypoglycemia.  Avoid injecting insulin into areas of the body that are going to be exercised. For example, avoid injecting insulin into: ? The arms, when playing  tennis. ? The legs, when jogging.  Keep records of your exercise habits. Doing this can help you and your health care provider adjust your diabetes management plan as needed. Write down: ? Food that you eat before and after you exercise. ? Blood glucose levels before and after you exercise. ? The type and amount of exercise you have done. ? When your insulin is expected to peak, if you use insulin. Avoid exercising at times when your insulin is peaking.  When you start a new exercise or activity, work with your health care provider to make sure the activity is safe for you, and to adjust your insulin, medicines, or food intake as needed.  Drink plenty of water while you exercise to prevent dehydration or heat stroke. Drink enough fluid to keep your urine clear or pale yellow. This information is not intended to replace advice given to you by your health care provider. Make sure you discuss any questions you have with your health care provider. Document Released: 10/20/2003 Document Revised: 02/17/2016 Document Reviewed: 01/09/2016 Elsevier Interactive Patient Education  2018 Elsevier Inc.  

## 2017-09-24 ENCOUNTER — Ambulatory Visit (INDEPENDENT_AMBULATORY_CARE_PROVIDER_SITE_OTHER): Payer: Medicare Other | Admitting: *Deleted

## 2017-09-24 DIAGNOSIS — I059 Rheumatic mitral valve disease, unspecified: Secondary | ICD-10-CM | POA: Diagnosis not present

## 2017-09-24 DIAGNOSIS — I4892 Unspecified atrial flutter: Secondary | ICD-10-CM

## 2017-09-24 DIAGNOSIS — I481 Persistent atrial fibrillation: Secondary | ICD-10-CM

## 2017-09-24 DIAGNOSIS — I4819 Other persistent atrial fibrillation: Secondary | ICD-10-CM

## 2017-09-24 DIAGNOSIS — Z5181 Encounter for therapeutic drug level monitoring: Secondary | ICD-10-CM | POA: Diagnosis not present

## 2017-09-24 LAB — POCT INR: INR: 2.7

## 2017-09-24 NOTE — Patient Instructions (Signed)
Description   Continue 1 tablet every day except 1/2 tablet on Tuesdays, Thursdays and Saturdays.  Recheck INR in 3 weeks.

## 2017-10-09 ENCOUNTER — Ambulatory Visit (HOSPITAL_COMMUNITY)
Admission: RE | Admit: 2017-10-09 | Discharge: 2017-10-09 | Disposition: A | Discharge status: Home / Self Care | Payer: Medicare Other | Source: Ambulatory Visit | Attending: Cardiology | Admitting: Cardiology

## 2017-10-09 VITALS — BP 146/90 | HR 78 | Wt 216.5 lb

## 2017-10-09 DIAGNOSIS — Z87891 Personal history of nicotine dependence: Secondary | ICD-10-CM | POA: Insufficient documentation

## 2017-10-09 DIAGNOSIS — E1122 Type 2 diabetes mellitus with diabetic chronic kidney disease: Secondary | ICD-10-CM | POA: Diagnosis not present

## 2017-10-09 DIAGNOSIS — E785 Hyperlipidemia, unspecified: Secondary | ICD-10-CM

## 2017-10-09 DIAGNOSIS — Z79899 Other long term (current) drug therapy: Secondary | ICD-10-CM | POA: Insufficient documentation

## 2017-10-09 DIAGNOSIS — I10 Essential (primary) hypertension: Secondary | ICD-10-CM

## 2017-10-09 DIAGNOSIS — M109 Gout, unspecified: Secondary | ICD-10-CM | POA: Insufficient documentation

## 2017-10-09 DIAGNOSIS — I2510 Atherosclerotic heart disease of native coronary artery without angina pectoris: Secondary | ICD-10-CM | POA: Insufficient documentation

## 2017-10-09 DIAGNOSIS — I129 Hypertensive chronic kidney disease with stage 1 through stage 4 chronic kidney disease, or unspecified chronic kidney disease: Secondary | ICD-10-CM | POA: Insufficient documentation

## 2017-10-09 DIAGNOSIS — G4733 Obstructive sleep apnea (adult) (pediatric): Secondary | ICD-10-CM | POA: Diagnosis not present

## 2017-10-09 DIAGNOSIS — E7800 Pure hypercholesterolemia, unspecified: Secondary | ICD-10-CM | POA: Diagnosis not present

## 2017-10-09 DIAGNOSIS — I480 Paroxysmal atrial fibrillation: Secondary | ICD-10-CM | POA: Insufficient documentation

## 2017-10-09 DIAGNOSIS — I429 Cardiomyopathy, unspecified: Secondary | ICD-10-CM | POA: Insufficient documentation

## 2017-10-09 DIAGNOSIS — N189 Chronic kidney disease, unspecified: Secondary | ICD-10-CM | POA: Insufficient documentation

## 2017-10-09 DIAGNOSIS — Z7901 Long term (current) use of anticoagulants: Secondary | ICD-10-CM | POA: Insufficient documentation

## 2017-10-09 DIAGNOSIS — I5032 Chronic diastolic (congestive) heart failure: Secondary | ICD-10-CM

## 2017-10-09 DIAGNOSIS — Z794 Long term (current) use of insulin: Secondary | ICD-10-CM | POA: Diagnosis not present

## 2017-10-09 LAB — BASIC METABOLIC PANEL
Anion gap: 10 (ref 5–15)
BUN: 11 mg/dL (ref 6–20)
CHLORIDE: 104 mmol/L (ref 101–111)
CO2: 25 mmol/L (ref 22–32)
CREATININE: 1.25 mg/dL — AB (ref 0.61–1.24)
Calcium: 9.3 mg/dL (ref 8.9–10.3)
GFR, EST NON AFRICAN AMERICAN: 60 mL/min — AB (ref >60)
Glucose, Bld: 204 mg/dL — ABNORMAL HIGH (ref 65–99)
Potassium: 3.9 mmol/L (ref 3.5–5.1)
SODIUM: 139 mmol/L (ref 135–145)

## 2017-10-09 LAB — MAGNESIUM: MAGNESIUM: 2 mg/dL (ref 1.7–2.4)

## 2017-10-09 MED ORDER — AMLODIPINE BESYLATE 5 MG PO TABS: 5.0000 mg | ORAL_TABLET | Freq: Every day | ORAL | 3 refills | Status: DC

## 2017-10-09 MED ORDER — FUROSEMIDE 20 MG PO TABS: ORAL_TABLET | ORAL | 6 refills | Status: DC

## 2017-10-09 MED ORDER — ATORVASTATIN CALCIUM 80 MG PO TABS: 80.0000 mg | ORAL_TABLET | Freq: Every day | ORAL | 3 refills | Status: DC

## 2017-10-09 MED ORDER — HYDRALAZINE HCL 50 MG PO TABS: 50.0000 mg | ORAL_TABLET | Freq: Two times a day (BID) | ORAL | 11 refills | Status: DC

## 2017-10-09 NOTE — Progress Notes (Signed)
Patient ID: [name] [name], male   DOB: Dec 23, 1955, 62 y.o.   MRN: [id number] PCP: Dr. Doreene Burke Cardiology: Dr. Aundra Dubin  62 y.o. with history of HTN, diabetes, paroxysmal atrial fibrillation, and CAD presents for cardiology followup.  He was admitted in 5/13 with chest pain with exertion.  LHC was done showing global HK with EF 45% and diffuse, severe distal and branch vessel disease.  There was no interventional option, but I suspect that this disease could be causing his anginal-type pain.  Echo at that time was read as showing EF 60-65%.  He was admitted in 5/14 with hypertensive urgency and chest pain.  He ruled out for MI and BP was controlled.  His EF was 50-55% by echo.  ETT-Sestamibi done as outpatient showed small fixed inferior defect with no ischemia but EF was 31%.  He was quite hypertensive during the study.  Cardiac MRI done to confirm EF in 5/14 showed EF 44%.  He had an episode of atrial fibrillation/RVR in 3/15 but went back into NSR.  Echo in 2/16 showed EF 55-60% with moderate diastolic dysfunction.   He had a Cardiolite in 9/16 showing inferior ischemia.  He was having exertional dyspnea with less activity than normal.  I was concerned for worsened CAD, so took him for [facility] in 9/16. By RHC, filling pressures were not significantly elevated. He had a severe distal RCA stenosis that was treated with DES x 2.  Echo showed EF 55-60%.    In 10/16, he was admitted with acute cholecystitis.  He received a cholecystostomy tube.  Cholecystectomy done in 6/17.   Atrial fibrillation in 1/17, cardioverted to NSR.   Recurrent concerning chest pain in 3/17.  Repeat LHC showed diffuse distal and branch vessel disease consistent with poorly-controlled DM, but patent RCA stents.    He was admitted with atrial fibrillation in 12/17.  Started on sotalol and converted to NSR.    He had recurrent chest pain in 5/18 with abnormal Cardiolite.  Cath was done again in 5/18, showing diffuse CAD with no  good interventional options. He was given a prescription for Imdur but does not appear to have ever started it.   He returns today for followup of diastolic CHF and CAD.  He has been doing well symptomatically.  No chest pain.  No dyspnea walking on flat ground.  No PND.  BP is high today but he did not take his medications as he was running late.  SBP around 130 normally.   No palpitations.  He is in NSR today.   ECG (personally reviewed): NSR, normal  Labs (5/13): K 3.6, creatinine 0.93, LDL 85, HDL 57 Labs (7/13): K 3.9, creatinine 1.1, LDL 57, HDL 51  Labs (5/14): K 4, creatinine 1.25 Labs (8/14): K 3.7, creatinine 0.9, LDL 103, HDL 42 Labs (8/15): K 3.5 creatinine 1.0, BNP 75 Labs (2/16): K 3.8, creatinine 1.1 Labs (9/16): K 3.5, creatinine 1.03, BNP 59, HDL 40, LDL 111, TGs 333 Labs (11/16): K 3.6, creaitnine 1.07, LFTs normal, LDL 44 Labs (6/17): K 3.4, creatinine 1.52 => 1.13 Labs (1/18): K 3.9, creatinine 1.25 Labs (6/18): K 3.8, creatinine 1.21, HCT 39.7 Labs (8/18): LDL 39, HDL 43 Labs (12/18): 4.4, creatinine 1.25, LFTs normal  PMH: 1. DM2 2. Gout 3. HTN: Cough with ACEI.  4. Cardiomyopathy: Suspect mixed ischemic/nonischemic (?ETOH-related).  EF 35% in 2008.  Echo in 5/13 showed EF 60-65% with moderate LVH but EF was 45% on LV-gram in 5/13.  Echo (5/14)  with EF 50-55%, mild LVH, inferobasal HK, mild MR.  ETT-Sestamibi in 5/14, however, showed EF 31%.  Cardiac MRI (6/14) with EF 44%, mild global hypokinesis, subepicardial delayed enhancement in a nonspecific RV insertion site pattern.  Echo (2/16) with EF 55-60%, moderate diastolic dysfunction. RHC (9/16) with mean RA 8, PA 39/20 mean 28, mean PCWP 14, CI 2.2. Echo (9/16) with EF 55-60%, grade II diastolic dysfunction.  - Echo (6/17) with EF 55-60%, moderate LVH, grade II diastolic dysfunction, mild AI, mild MR.  5. CAD: LHC in 2010 with mild nonobstructive disease.  LHC (5/13) with diffuse distal and branch vessel disease, mild  global hypokinesis and EF 45%.  ETT-Sestamibi (5/14): EF 31%, small fixed inferior defect with no ischemia.  Lexiscan Cardiolite (9/16) with EF 26%, moderate inferior defect that was partially reversible, suggesting ischemia => High risk study. LHC (9/16) with 40-50% mLAD, diffuse up to 50% distal LAD, 90% ostium of branch off ramus, 95% dRCA => DES to RCA x 2, EF 55-60%.   - LHC (3/17) with diffuse branch and distal vessel disease c/w poorly controlled DM, RCA stents patent.  - LHC (5/18): Diffuse CAD with no good interventional option. 60% mRCA, 90% dLAD, 50% serial mid ramus stenoses, ramus branches with ostial 80-90% stenoses.  6. H/o chronic HBV 7. Osteochondroma left shoulder.  8. Hyperlipidemia 9. Paroxysmal atrial fibrillation: Coumadin.  Developed cough and increased ESR with amiodarone.  DCCV in 1/17.  Recurrent atrial fibrillation in 12/17, sotalol started.  10. Atrial flutter: had ablations in 2007 and 2008.  11. OSA on CPAP.  12. Acute cholecystitis (10/16): Cholecystostomy tube placed.  Cholecystectomy 6/17.  13. CKD  SH: Married, prior smoker.  Some ETOH, occasionally heavy in past.  Does use occasional marijuana. Out of work Engineer, drilling. 2 daughters in grad school.   FH: CAD  ROS: All systems reviewed and negative except as per HPI.   Current Outpatient Medications  Medication Sig Dispense Refill  . acetaminophen (TYLENOL) 500 MG tablet Take 1,000 mg by mouth every 6 (six) hours as needed for headache (pain). Reported on 08/18/2015    . amLODipine (NORVASC) 5 MG tablet Take 1 tablet (5 mg total) by mouth daily. 90 tablet 3  . atorvastatin (LIPITOR) 80 MG tablet Take 1 tablet (80 mg total) by mouth daily. 90 tablet 3  . Blood Glucose Monitoring Suppl (ACCU-CHEK AVIVA PLUS) w/Device KIT Use as directed for 3 times daily testing of blood glucose (Patient taking differently: 1 strip by Other route daily. ) 1 kit 0  . carvedilol (COREG) 25 MG tablet TAKE ONE TABLET BY MOUTH TWICE  DAILY WITH MEALS 60 tablet 6  . Cholecalciferol (VITAMIN D3) 2000 units TABS Take 2,000 Units by mouth daily.    . furosemide (LASIX) 20 MG tablet TAKE TWO TABLETS BY MOUTH IN THE MORNING, THEN TAKE ONE IN THE EVENING 90 tablet 6  . glucose blood (ACCU-CHEK AVIVA PLUS) test strip Use as instructed for 3 times daily testing of blood glucose (Patient taking differently: 1 each by Other route daily. ) 100 each 12  . hydrALAZINE (APRESOLINE) 50 MG tablet Take 1 tablet (50 mg total) by mouth 2 (two) times daily. 60 tablet 11  . Insulin Glargine (LANTUS SOLOSTAR) 100 UNIT/ML Solostar Pen Inject 30 Units into the skin every morning. And pen needles 1/day 30 mL 3  . Lancet Devices (ACCU-CHEK SOFTCLIX) lancets Use as instructed for 3 times daily testing of blood glucose 1 each 0  . losartan (COZAAR) 25  MG tablet Take 1 tablet (25 mg total) by mouth daily. 90 tablet 3  . Magnesium 200 MG TABS Take 1 tablet (200 mg total) by mouth daily. 60 each   . metFORMIN (GLUCOPHAGE) 1000 MG tablet Take 1 tablet (1,000 mg total) by mouth 2 (two) times daily with a meal. 180 tablet 3  . nitroGLYCERIN (NITROSTAT) 0.4 MG SL tablet Place 1 tablet (0.4 mg total) under the tongue every 5 (five) minutes as needed for chest pain (x 3 tabs daily). Reported on 08/18/2015 100 tablet 1  . sotalol (BETAPACE) 120 MG tablet Take 1 tablet (120 mg total) by mouth every 12 (twelve) hours. 180 tablet 3  . spironolactone (ALDACTONE) 25 MG tablet Take 1 tablet (25 mg total) by mouth at bedtime. 90 tablet 3  . TRUEPLUS LANCETS 28G MISC 1 each by Does not apply route 3 (three) times daily. (Patient taking differently: 1 each by Other route daily. ) 100 each 12  . warfarin (COUMADIN) 5 MG tablet Take as directed by coumadin clinic 35 tablet 0  . ACCU-CHEK SOFTCLIX LANCETS lancets Use as instructed for three times daily testing of blood glucose (Patient taking differently: 1 each by Other route daily. ) 100 each 12   No current  facility-administered medications for this encounter.     BP (!) 146/90   Pulse 78   Wt 216 lb 8 oz (98.2 kg)   SpO2 96%   BMI 29.36 kg/m  General: NAD Neck: No JVD, no thyromegaly or thyroid nodule.  Lungs: Clear to auscultation bilaterally with normal respiratory effort. CV: Nondisplaced PMI.  Heart regular S1/S2, no S3/S4, no murmur.  No peripheral edema.  No carotid bruit.  Normal pedal pulses.  Abdomen: Soft, nontender, no hepatosplenomegaly, no distention.  Skin: Intact without lesions or rashes.  Neurologic: Alert and oriented x 3.  Psych: Normal affect. Extremities: No clubbing or cyanosis.  HEENT: Normal.    Assessment/Plan:  Atrial fibrillation Paroxysmal atrial fibrillation. He is in NSR today.  - Continue warfarin, CBC today.  - Continue sotalol for maintenance of NSR.  QTc ok on today's ECG. BMET today.   Coronary artery disease LHC in 9/16 showed diffuse CAD, most significant involving the distal RCA.  Patient had DES x 2 to RCA.  Cath in 3/17 with patent RCA stents and diffuse distal and branch vessel disease concerning for poorly controlled diabetes.  Cath in 5/18 again showed diffuse CAD without good interventional target.   - Continue warfarin.  No ASA with stable CAD. - Continue atorvastatin 80 mg daily, good lipids in 8/18.  HYPERCHOLESTEROLEMIA  On atorvastatin, good lipids 8/18.   Cardiomyopathy  EF 47% on cardiac MRI in 6/14 but EF improved back to 55-60% on 2/16 echo.  However, EF down to 26% on Cardiolite in 9/16.  I think this was inaccurate.  EF was 55-60% by both LV-gram and echo in 9/16 and 55-60% on 6/17 echo.  He looks euvolemic on exam.  - Continue Lasix 20 mg daily.   Hypertension BP high but did not take am meds.  Says SBP around 130 at home.   [facility] Needs to get back on CPAP. Needs followup with Dr. Radford Pax.    CKD BMET today.   Followup in 6 months.   Loralie Champagne 10/09/2017

## 2017-10-09 NOTE — Patient Instructions (Addendum)
Labs drawn today (if we do not call you, then your lab work was stable)   Your physician recommends that you schedule a follow-up appointment in: 6 months (August, 2019) with Dr. McLean  Please Call an Schedule Appointment    

## 2017-10-15 ENCOUNTER — Ambulatory Visit (INDEPENDENT_AMBULATORY_CARE_PROVIDER_SITE_OTHER): Payer: Medicare Other | Admitting: *Deleted

## 2017-10-15 DIAGNOSIS — I4892 Unspecified atrial flutter: Secondary | ICD-10-CM | POA: Diagnosis not present

## 2017-10-15 DIAGNOSIS — Z5181 Encounter for therapeutic drug level monitoring: Secondary | ICD-10-CM | POA: Diagnosis not present

## 2017-10-15 LAB — POCT INR: INR: 1.6

## 2017-10-15 NOTE — Patient Instructions (Signed)
Description   Today take 1 tablet then continue taking 1 tablet every day except 1/2 tablet on Tuesdays, Thursdays and Saturdays.  Recheck INR in 2 weeks.

## 2017-10-29 ENCOUNTER — Ambulatory Visit (INDEPENDENT_AMBULATORY_CARE_PROVIDER_SITE_OTHER): Payer: Medicare Other | Admitting: *Deleted

## 2017-10-29 DIAGNOSIS — I4892 Unspecified atrial flutter: Secondary | ICD-10-CM

## 2017-10-29 DIAGNOSIS — Z5181 Encounter for therapeutic drug level monitoring: Secondary | ICD-10-CM

## 2017-10-29 LAB — POCT INR: INR: 2.8

## 2017-10-29 NOTE — Patient Instructions (Addendum)
Description   Today take 1 tablet (missed last pm dose) then continue taking 1 tablet every day except 1/2 tablet on Tuesdays, Thursdays and Saturdays.  Start Coumadin in the morning since you miss the night doses of your medications.  Recheck INR in 2 weeks. [facility] [phone]      Start Coumadin in the morning since you miss the night doses of medications.

## 2017-11-07 ENCOUNTER — Other Ambulatory Visit: Payer: Self-pay | Admitting: Internal Medicine

## 2017-11-07 DIAGNOSIS — E119 Type 2 diabetes mellitus without complications: Secondary | ICD-10-CM

## 2017-11-07 DIAGNOSIS — Z794 Long term (current) use of insulin: Principal | ICD-10-CM

## 2017-11-07 NOTE — Telephone Encounter (Signed)
Patient called to get a refill on his metformin   He uses [facility] [address]

## 2017-11-14 ENCOUNTER — Other Ambulatory Visit: Payer: Self-pay | Admitting: Cardiology

## 2017-11-14 DIAGNOSIS — I10 Essential (primary) hypertension: Secondary | ICD-10-CM

## 2017-12-17 ENCOUNTER — Encounter: Payer: Self-pay | Admitting: Family Medicine

## 2017-12-17 ENCOUNTER — Ambulatory Visit: Payer: Medicare Other | Attending: Family Medicine | Admitting: Family Medicine

## 2017-12-17 VITALS — BP 133/77 | HR 77 | Temp 98.6°F | Ht 72.0 in | Wt 211.6 lb

## 2017-12-17 DIAGNOSIS — Z9119 Patient's noncompliance with other medical treatment and regimen: Secondary | ICD-10-CM | POA: Diagnosis not present

## 2017-12-17 DIAGNOSIS — I5032 Chronic diastolic (congestive) heart failure: Secondary | ICD-10-CM | POA: Diagnosis not present

## 2017-12-17 DIAGNOSIS — Z7901 Long term (current) use of anticoagulants: Secondary | ICD-10-CM | POA: Diagnosis not present

## 2017-12-17 DIAGNOSIS — N62 Hypertrophy of breast: Secondary | ICD-10-CM

## 2017-12-17 DIAGNOSIS — Z794 Long term (current) use of insulin: Secondary | ICD-10-CM | POA: Insufficient documentation

## 2017-12-17 DIAGNOSIS — G4730 Sleep apnea, unspecified: Secondary | ICD-10-CM | POA: Insufficient documentation

## 2017-12-17 DIAGNOSIS — I480 Paroxysmal atrial fibrillation: Secondary | ICD-10-CM | POA: Diagnosis not present

## 2017-12-17 DIAGNOSIS — I429 Cardiomyopathy, unspecified: Secondary | ICD-10-CM | POA: Insufficient documentation

## 2017-12-17 DIAGNOSIS — I10 Essential (primary) hypertension: Secondary | ICD-10-CM | POA: Diagnosis not present

## 2017-12-17 DIAGNOSIS — E0800 Diabetes mellitus due to underlying condition with hyperosmolarity without nonketotic hyperglycemic-hyperosmolar coma (NKHHC): Secondary | ICD-10-CM | POA: Diagnosis not present

## 2017-12-17 DIAGNOSIS — Z91199 Patient's noncompliance with other medical treatment and regimen due to unspecified reason: Secondary | ICD-10-CM

## 2017-12-17 DIAGNOSIS — I110 Hypertensive heart disease with heart failure: Secondary | ICD-10-CM | POA: Insufficient documentation

## 2017-12-17 DIAGNOSIS — E119 Type 2 diabetes mellitus without complications: Secondary | ICD-10-CM | POA: Insufficient documentation

## 2017-12-17 DIAGNOSIS — F419 Anxiety disorder, unspecified: Secondary | ICD-10-CM | POA: Insufficient documentation

## 2017-12-17 DIAGNOSIS — I4892 Unspecified atrial flutter: Secondary | ICD-10-CM

## 2017-12-17 DIAGNOSIS — I2510 Atherosclerotic heart disease of native coronary artery without angina pectoris: Secondary | ICD-10-CM | POA: Diagnosis not present

## 2017-12-17 LAB — GLUCOSE, POCT (MANUAL RESULT ENTRY): POC GLUCOSE: 213 mg/dL — AB (ref 70–99)

## 2017-12-17 LAB — POCT GLYCOSYLATED HEMOGLOBIN (HGB A1C): HEMOGLOBIN A1C: 9.4

## 2017-12-17 MED ORDER — INSULIN GLARGINE 100 UNIT/ML SOLOSTAR PEN
35.0000 [IU] | PEN_INJECTOR | SUBCUTANEOUS | 3 refills | Status: DC
Start: 2017-12-17 — End: 2018-07-08

## 2017-12-17 MED ORDER — ISOSORBIDE MONONITRATE ER 30 MG PO TB24: 30.0000 mg | ORAL_TABLET | Freq: Every day | ORAL | 6 refills | Status: DC

## 2017-12-17 NOTE — Progress Notes (Signed)
Subjective:  Patient ID: [name] [name], male    DOB: 01-13-1956  Age: 62 y.o. MRN: [id number]  CC: Diabetes   HPI [name] [name] is a 62 year old male with a history of CHF (EF 55 to 60% from echo of 01/2016), atrial fibrillation, type 2 diabetes mellitus (A1c 9.4), hypertension who presents today to establish care with me as he was previously followed by Dr. Doreene Burke. He complains of a lump in his left breast for the last 3 to 4 weeks with associated soreness but denies presence of symptoms on the right. He has been compliant with his medications in the morning but forgets to take medications in the evening he states and that includes his Lantus which he sometimes takes 30 units at night and at other times he forgets and sleeps off.  He denies visual concerns, numbness in extremities. With regards to his CHF he endorses dyspnea on mild to moderate exertion but no dyspnea at rest.  Denies orthopnea, pedal edema.  He is managed by the Coumadin clinic where his INRs are monitored, last INR was 2.8 on 10/2017. He has no additional concerns today.  Past Medical History:  Diagnosis Date  . Anginal pain ([place])   . Anxiety   . Arrhythmia   . CAD (coronary artery disease), native coronary artery    a. Nonobstructive CAD by cath 2013 - diffuse distal and branch vessel CAD, no severe disease in the major coronaries, LV mild global hypokinesis, EF 45%. b. ETT-Sestamibi 5/14: EF 31%, small fixed inferior defect with no ischemia.  . Cholecystitis   . Chronic CHF ([place])    a. Mixed ICM/NICM (?EtOH). EF 35% in 2008. Echo 5/13: EF 60-65%, mod LVH, EF 45% on V gram in 12/2011. EF 12/2012: EF 50-55%, mild LVH, inferobasal HK, mild MR. ETT-Ses 5/14 EF 41%. Cardiac MRI 5/14: EF 44%, mild global HK, subepicardial delayed enhancement in nonspecific RV insertion pattern.  . COLONIC POLYPS, HX OF 12/30/2006  . Gout   . H/O atrial flutter 2007   a. Ablations in 2007, 2008.  [name] Heart murmur   . HEPATITIS B, CHRONIC  12/30/2006  . History of alcohol abuse   . History of hiatal hernia   . History of medication noncompliance   . History of nuclear stress test    Myoview 3/17: + chest pain; EF 33%, downsloping ST depression 2, V4-6, LVH with strain, no ischemia on images; intermediate risk   . HYPERCHOLESTEROLEMIA 07/11/2010  . Hypertension   . Left sciatic nerve pain since 04/2015  . LIVER FUNCTION TESTS, ABNORMAL, HX OF 12/30/2006  . MITRAL REGURGITATION 12/30/2006  . 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 PAF (paroxysmal atrial fibrillation) (HCC)    On coumadin  . Rectal bleeding 12/18/2011   Scheduled for colonoscopy.    . Shortness of breath dyspnea    with excertion  . Sleep apnea    "suppose to send mask but they never did" (05/03/2015)  . Type II diabetes mellitus ([place])     Past Surgical History:  Procedure Laterality Date  . A FLUTTER ABLATION  2007, 2008   catheter ablation   . CARDIAC CATHETERIZATION N/A 05/03/2015   Procedure: Right/Left Heart Cath and Coronary Angiography;  Surgeon: Larey Dresser, MD;  Location: [place] [facility];  Service: Cardiovascular;  Laterality: N/A;  . CARDIAC CATHETERIZATION N/A 05/03/2015   Procedure: Coronary Stent Intervention;  Surgeon: Jettie Booze, MD;  Location: [place] [facility];  Service: Cardiovascular;  Laterality: N/A;  . CARDIAC CATHETERIZATION N/A 11/04/2015   Procedure: Left Heart Cath and Coronary Angiography;  Surgeon: Larey Dresser, MD;  Location: [place] [facility];  Service: Cardiovascular;  Laterality: N/A;  . CARDIOVERSION N/A 08/24/2015   Procedure: CARDIOVERSION;  Surgeon: Thayer Headings, MD;  Location: [facility] ENDOSCOPY;  Service: Cardiovascular;  Laterality: N/A;  . CHOLECYSTECTOMY N/A 02/07/2016   Procedure: LAPAROSCOPIC CHOLECYSTECTOMY WITH  INTRAOPERATIVE CHOLANGIOGRAM;  Surgeon: Greer Pickerel, MD;  Location: [facility];  Service: General;  Laterality: N/A;  . CORONARY ANGIOPLASTY  12/05/01  . LEFT HEART CATH AND CORONARY  ANGIOGRAPHY N/A 12/28/2016   Procedure: Left Heart Cath and Coronary Angiography;  Surgeon: Larey Dresser, MD;  Location: [place] [facility];  Service: Cardiovascular;  Laterality: N/A;  . LEFT HEART CATHETERIZATION WITH CORONARY ANGIOGRAM N/A 12/19/2011   Procedure: LEFT HEART CATHETERIZATION WITH CORONARY ANGIOGRAM;  Surgeon: Larey Dresser, MD;  Location: [facility] CATH LAB;  Service: Cardiovascular;  Laterality: N/A;  . OSTEOCHONDROMA EXCISION Left 1972   "took bone tumor off my shoulder"    Allergies  Allergen Reactions  . Ace Inhibitors Cough  . Other Hives and Other (See Comments)    Patient reports developing hives after receiving "some antibiotic given in 1980''s at [facility]". He does not know which antibiotic.     Outpatient Medications Prior to Visit  Medication Sig Dispense Refill  . ACCU-CHEK SOFTCLIX LANCETS lancets Use as instructed for three times daily testing of blood glucose (Patient taking differently: 1 each by Other route daily. ) 100 each 12  . acetaminophen (TYLENOL) 500 MG tablet Take 1,000 mg by mouth every 6 (six) hours as needed for headache (pain). Reported on 08/18/2015    . amLODipine (NORVASC) 5 MG tablet Take 1 tablet (5 mg total) by mouth daily. 90 tablet 3  . atorvastatin (LIPITOR) 80 MG tablet Take 1 tablet (80 mg total) by mouth daily. 90 tablet 3  . Blood Glucose Monitoring Suppl (ACCU-CHEK AVIVA PLUS) w/Device KIT Use as directed for 3 times daily testing of blood glucose (Patient taking differently: 1 strip by Other route daily. ) 1 kit 0  . carvedilol (COREG) 25 MG tablet TAKE ONE TABLET BY MOUTH TWICE DAILY WITH MEALS 60 tablet 6  . furosemide (LASIX) 20 MG tablet TAKE TWO TABLETS BY MOUTH IN THE MORNING, THEN TAKE ONE IN THE EVENING 90 tablet 6  . glucose blood (ACCU-CHEK AVIVA PLUS) test strip Use as instructed for 3 times daily testing of blood glucose (Patient taking differently: 1 each by Other route daily. ) 100 each 12  . hydrALAZINE  (APRESOLINE) 50 MG tablet Take 1 tablet (50 mg total) by mouth 2 (two) times daily. 60 tablet 11  . Lancet Devices (ACCU-CHEK SOFTCLIX) lancets Use as instructed for 3 times daily testing of blood glucose 1 each 0  . losartan (COZAAR) 25 MG tablet Take 1 tablet (25 mg total) by mouth daily. 90 tablet 3  . metFORMIN (GLUCOPHAGE) 1000 MG tablet TAKE ONE TABLET BY MOUTH TWICE DAILY WITH MEALS 180 tablet 0  . nitroGLYCERIN (NITROSTAT) 0.4 MG SL tablet Place 1 tablet (0.4 mg total) under the tongue every 5 (five) minutes as needed for chest pain (x 3 tabs daily). Reported on 08/18/2015 100 tablet 1  . sotalol (BETAPACE) 120 MG tablet Take 1 tablet (120 mg total) by mouth every 12 (twelve) hours. 180 tablet 3  . TRUEPLUS LANCETS 28G MISC 1 each by Does not apply route 3 (  three) times daily. (Patient taking differently: 1 each by Other route daily. ) 100 each 12  . warfarin (COUMADIN) 5 MG tablet TAKE AS DIRECTED BY  COUMADIN  CLINIC 35 tablet 0  . Insulin Glargine (LANTUS SOLOSTAR) 100 UNIT/ML Solostar Pen Inject 30 Units into the skin every morning. And pen needles 1/day 30 mL 3  . spironolactone (ALDACTONE) 25 MG tablet Take 1 tablet (25 mg total) by mouth at bedtime. 90 tablet 3  . Cholecalciferol (VITAMIN D3) 2000 units TABS Take 2,000 Units by mouth daily.    . Magnesium 200 MG TABS Take 1 tablet (200 mg total) by mouth daily. (Patient not taking: Reported on 12/17/2017) 60 each    No facility-administered medications prior to visit.     ROS Review of Systems  Constitutional: Negative for activity change and appetite change.  HENT: Negative for sinus pressure and sore throat.   Eyes: Negative for visual disturbance.  Respiratory: Negative for cough, chest tightness and shortness of breath.   Cardiovascular: Negative for chest pain and leg swelling.  Gastrointestinal: Negative for abdominal distention, abdominal pain, constipation and diarrhea.  Endocrine: Negative.   Genitourinary: Negative for  dysuria.  Musculoskeletal: Negative for joint swelling and myalgias.  Skin: Negative for rash.  Allergic/Immunologic: Negative.   Neurological: Negative for weakness, light-headedness and numbness.  Psychiatric/Behavioral: Negative for dysphoric mood and suicidal ideas.    Objective:  BP 133/77   Pulse 77   Temp 98.6 F (37 C) (Oral)   Ht 6' (1.829 m)   Wt 211 lb 9.6 oz (96 kg)   SpO2 99%   BMI 28.70 kg/m   BP/Weight 12/17/2017 6/43/3295 08/20/8414  Systolic BP 606 301 601  Diastolic BP 77 90 84  Wt. (Lbs) 211.6 216.5 215.8  BMI 28.7 29.36 29.27     Physical Exam  Constitutional: He is oriented to person, place, and time. He appears well-developed and well-nourished.  Cardiovascular: Normal rate, normal heart sounds and intact distal pulses.  No murmur heard. Pulmonary/Chest: Effort normal and breath sounds normal. He has no wheezes. He has no rales. He exhibits no tenderness. Right breast exhibits no mass and no tenderness. Left breast exhibits mass (gynecomastia).  Abdominal: Soft. Bowel sounds are normal. He exhibits no distension and no mass. There is no tenderness.  Musculoskeletal: Normal range of motion.  Neurological: He is alert and oriented to person, place, and time.  Skin: Skin is warm and dry.  Psychiatric: He has a normal mood and affect.     Lab Results  Component Value Date   HGBA1C 9.4 12/17/2017    Assessment & Plan:   1. Type 2 diabetes mellitus without complication, with long-term current use of insulin (HCC) Uncontrolled with A1c of 9.4 due to noncompliance Increased dose of Lantus to 35 units and he has been advised to use a reminder to assist with compliance Diabetic diet, lifestyle modifications - POCT glucose (manual entry) - POCT glycosylated hemoglobin (Hb A1C) - Insulin Glargine (LANTUS SOLOSTAR) 100 UNIT/ML Solostar Pen; Inject 35 Units into the skin every morning. And pen needles 1/day  Dispense: 30 mL; Refill: 3  2. Atrial flutter with  controlled response ([place]) Currently anticoagulated with Coumadin which is monitored by the Coumadin clinic Rate control with sotalol He seems to have missed appointments for INR monitoring of the Coumadin clinic and we will try to get him back into see them.  3. Essential hypertension Controlled Low-sodium diet  4. Chronic diastolic CHF (congestive heart failure) (HCC) EF  55 to 60%, grade 2 DD from echo of 01/2016 Euvolemic, NYHA II-III Discontinue spironolactone due to breast symptoms and commence isosorbide - isosorbide mononitrate (IMDUR) 30 MG 24 hr tablet; Take 1 tablet (30 mg total) by mouth daily.  Dispense: 30 tablet; Refill: 6  5. Gynecomastia, male Likely medication induced Discontinue Spironolactone  6. Non-compliance Secondary to not remembering to take medications We have discussed use of a reminder especially since he has a problem with his p.m. medications   Meds ordered this encounter  Medications  . Insulin Glargine (LANTUS SOLOSTAR) 100 UNIT/ML Solostar Pen    Sig: Inject 35 Units into the skin every morning. And pen needles 1/day    Dispense:  30 mL    Refill:  3  . isosorbide mononitrate (IMDUR) 30 MG 24 hr tablet    Sig: Take 1 tablet (30 mg total) by mouth daily.    Dispense:  30 tablet    Refill:  6    Discontinue spironolactone    Follow-up: Return in about 6 weeks (around 01/28/2018) for Gynecomastia follow up.Charlott Rakes MD

## 2017-12-17 NOTE — Patient Instructions (Signed)
Gynecomastia, Adult Gynecomastia is an overgrowth of gland tissue in a man's breasts. This may cause one or both breasts to become enlarged. This often develops in men who have an imbalance of the male sex hormone (testosterone) and the male sex hormone (estrogen). This means that a man may have too much estrogen, too little testosterone, or both. Gynecomastia may be a normal part of aging for some men. It can also happen to adolescent boys during puberty. What are the causes? Gynecomastia may be caused by:  Certain medicines, such as: ? Estrogen supplements and medicines that act like estrogen in the body. ? Medicines that keep testosterone from functioning normally in the body (testosterone-inhibiting drugs). ? Anabolic steroids. ? Medicines to treat heartburn, cancer, heart disease, mental health problems, HIV (human immunodeficiency virus) or AIDS (acquired immunodeficiency syndrome). ? Antibiotic medicine. ? Chemotherapy medicine.  Recreational drugs, including alcohol, marijuana, and opioids.  Herbal products, including lavender and tea tree oil.  A gene that is passed along from parent to child (inherited).  Tumors in the pituitary or adrenal gland.  An overactive thyroid gland.  Certain inherited disorders, including a genetic disease that causes low testosterone in males (Klinefelter syndrome).  Cancer of the lung, kidney, liver, testicle, or gastrointestinal tract.  Conditions that cause liver or kidney failure.  Poor nutrition and starvation.  Testicle shrinking or failure (testicularatrophy).  In some cases, the cause may not be known. What increases the risk? You may have a higher risk for gynecomastia if you:  Are 50 years old or older.  Are overweight.  Abuse alcohol or other drugs.  Have a family history of gynecomastia.  What are the signs or symptoms?  Most of the time, breast enlargement is the only symptom. The enlargement may start near the  nipple, and the breast tissue may feel firm and rubbery. The breast may feel itchy, painful or tender. How is this diagnosed? This condition may be diagnosed based on:  Your symptoms.  Your medical history.  A physical exam.  Imaging tests, such as: ? An ultrasound. ? A mammogram. ? An MRI.  Blood tests.  Removal of a sample of breast tissue to be tested in a lab (biopsy).  How is this treated? Gynecomastia may go away on its own, without treatment. If gynecomastia is caused by a medical problem or drug abuse, treatment may include:  Getting treatment for the underlying medical problem or for drug abuse.  Changing or stopping medicines.  Medicines to block the effects of estrogen.  Taking a testosterone replacement.  Surgery to remove breast tissue or any lumps in your breasts.  Breast reduction surgery. This may be a possibility if you have severe or painful gynecomastia.  Follow these instructions at home:  Take over-the-counter and prescription medicines only as told by your health care provider.  Talk to your health care provider before taking any herbal medicines or diet supplements.  Do not abuse drugs or alcohol.  Keep all follow-up visits as told by your health care provider. This is important. Contact a health care provider if:  Your breast tissue grows larger or gets more swollen or painful.  You have a lump in your testicle.  You have blood or discharge coming from your nipples.  Your nipple changes shape.  You develop a hard or painful lump in your breast. This information is not intended to replace advice given to you by your health care provider. Make sure you discuss any questions you have with your   health care provider. Document Released: 09/23/2015 Document Revised: 01/06/2016 Document Reviewed: 09/23/2015 Elsevier Interactive Patient Education  2018 [employer]  

## 2017-12-18 ENCOUNTER — Telehealth: Payer: Self-pay | Admitting: Family Medicine

## 2017-12-18 NOTE — Telephone Encounter (Signed)
I saw this patient yesterday and he has not been seen by the Coumadin clinic since 10/29/17.  Could you please coordinate with them regarding getting him in to have his INR checked?  Thank you

## 2017-12-23 ENCOUNTER — Ambulatory Visit (INDEPENDENT_AMBULATORY_CARE_PROVIDER_SITE_OTHER): Payer: Medicare Other | Admitting: *Deleted

## 2017-12-23 DIAGNOSIS — Z5181 Encounter for therapeutic drug level monitoring: Secondary | ICD-10-CM

## 2017-12-23 LAB — POCT INR: INR: 2.7

## 2017-12-23 NOTE — Patient Instructions (Signed)
Description   Continue taking 1 tablet every day except 1/2 tablet on Tuesdays, Thursdays, and Saturdays.  Start Coumadin in the morning since you miss the night doses of your medications.  Recheck INR in 4 weeks. Coumadin Clinic [phone]

## 2017-12-31 ENCOUNTER — Other Ambulatory Visit: Payer: Self-pay | Admitting: Internal Medicine

## 2017-12-31 DIAGNOSIS — I4819 Other persistent atrial fibrillation: Secondary | ICD-10-CM

## 2018-01-24 ENCOUNTER — Other Ambulatory Visit: Payer: Self-pay | Admitting: Cardiology

## 2018-01-24 DIAGNOSIS — I10 Essential (primary) hypertension: Secondary | ICD-10-CM

## 2018-01-28 ENCOUNTER — Ambulatory Visit (INDEPENDENT_AMBULATORY_CARE_PROVIDER_SITE_OTHER): Payer: Medicare Other | Admitting: *Deleted

## 2018-01-28 ENCOUNTER — Encounter (INDEPENDENT_AMBULATORY_CARE_PROVIDER_SITE_OTHER): Payer: Self-pay

## 2018-01-28 ENCOUNTER — Ambulatory Visit: Payer: Medicare Other | Attending: Family Medicine | Admitting: Family Medicine

## 2018-01-28 ENCOUNTER — Encounter: Payer: Self-pay | Admitting: Family Medicine

## 2018-01-28 VITALS — BP 135/80 | HR 80 | Temp 98.7°F | Ht 72.0 in | Wt 208.4 lb

## 2018-01-28 DIAGNOSIS — M109 Gout, unspecified: Secondary | ICD-10-CM | POA: Diagnosis not present

## 2018-01-28 DIAGNOSIS — I429 Cardiomyopathy, unspecified: Secondary | ICD-10-CM | POA: Diagnosis not present

## 2018-01-28 DIAGNOSIS — Z9049 Acquired absence of other specified parts of digestive tract: Secondary | ICD-10-CM | POA: Diagnosis not present

## 2018-01-28 DIAGNOSIS — N62 Hypertrophy of breast: Secondary | ICD-10-CM | POA: Diagnosis not present

## 2018-01-28 DIAGNOSIS — N6320 Unspecified lump in the left breast, unspecified quadrant: Secondary | ICD-10-CM | POA: Diagnosis not present

## 2018-01-28 DIAGNOSIS — Z794 Long term (current) use of insulin: Secondary | ICD-10-CM | POA: Diagnosis not present

## 2018-01-28 DIAGNOSIS — E119 Type 2 diabetes mellitus without complications: Secondary | ICD-10-CM | POA: Diagnosis not present

## 2018-01-28 DIAGNOSIS — I10 Essential (primary) hypertension: Secondary | ICD-10-CM

## 2018-01-28 DIAGNOSIS — I110 Hypertensive heart disease with heart failure: Secondary | ICD-10-CM | POA: Diagnosis not present

## 2018-01-28 DIAGNOSIS — Z955 Presence of coronary angioplasty implant and graft: Secondary | ICD-10-CM | POA: Insufficient documentation

## 2018-01-28 DIAGNOSIS — I2510 Atherosclerotic heart disease of native coronary artery without angina pectoris: Secondary | ICD-10-CM | POA: Insufficient documentation

## 2018-01-28 DIAGNOSIS — Z9889 Other specified postprocedural states: Secondary | ICD-10-CM | POA: Diagnosis not present

## 2018-01-28 DIAGNOSIS — F419 Anxiety disorder, unspecified: Secondary | ICD-10-CM | POA: Insufficient documentation

## 2018-01-28 DIAGNOSIS — E0800 Diabetes mellitus due to underlying condition with hyperosmolarity without nonketotic hyperglycemic-hyperosmolar coma (NKHHC): Secondary | ICD-10-CM

## 2018-01-28 DIAGNOSIS — I509 Heart failure, unspecified: Secondary | ICD-10-CM | POA: Insufficient documentation

## 2018-01-28 DIAGNOSIS — E7800 Pure hypercholesterolemia, unspecified: Secondary | ICD-10-CM | POA: Diagnosis not present

## 2018-01-28 DIAGNOSIS — I480 Paroxysmal atrial fibrillation: Secondary | ICD-10-CM | POA: Diagnosis not present

## 2018-01-28 DIAGNOSIS — G4730 Sleep apnea, unspecified: Secondary | ICD-10-CM | POA: Diagnosis not present

## 2018-01-28 DIAGNOSIS — Z7901 Long term (current) use of anticoagulants: Secondary | ICD-10-CM | POA: Diagnosis not present

## 2018-01-28 DIAGNOSIS — E870 Hyperosmolality and hypernatremia: Secondary | ICD-10-CM | POA: Diagnosis not present

## 2018-01-28 DIAGNOSIS — B181 Chronic viral hepatitis B without delta-agent: Secondary | ICD-10-CM | POA: Diagnosis not present

## 2018-01-28 DIAGNOSIS — Z5181 Encounter for therapeutic drug level monitoring: Secondary | ICD-10-CM | POA: Diagnosis not present

## 2018-01-28 DIAGNOSIS — Z79899 Other long term (current) drug therapy: Secondary | ICD-10-CM | POA: Insufficient documentation

## 2018-01-28 LAB — POCT INR: INR: 1.8 — AB (ref 2.0–3.0)

## 2018-01-28 LAB — GLUCOSE, POCT (MANUAL RESULT ENTRY): POC GLUCOSE: 161 mg/dL — AB (ref 70–99)

## 2018-01-28 MED ORDER — WARFARIN SODIUM 5 MG PO TABS: ORAL_TABLET | ORAL | 1 refills | Status: DC

## 2018-01-28 NOTE — Patient Instructions (Signed)
Description   Today take 1 tablet, then Continue taking 1 tablet every day except 1/2 tablet on Tuesdays, Thursdays, and Saturdays.  Start Coumadin in the morning since you miss the night doses of your medications.  Recheck INR in 3 weeks. Coumadin Clinic [phone]

## 2018-01-28 NOTE — Patient Instructions (Signed)
Gynecomastia, Adult Gynecomastia is an overgrowth of gland tissue in a man's breasts. This may cause one or both breasts to become enlarged. This often develops in men who have an imbalance of the male sex hormone (testosterone) and the male sex hormone (estrogen). This means that a man may have too much estrogen, too little testosterone, or both. Gynecomastia may be a normal part of aging for some men. It can also happen to adolescent boys during puberty. What are the causes? Gynecomastia may be caused by:  Certain medicines, such as: ? Estrogen supplements and medicines that act like estrogen in the body. ? Medicines that keep testosterone from functioning normally in the body (testosterone-inhibiting drugs). ? Anabolic steroids. ? Medicines to treat heartburn, cancer, heart disease, mental health problems, HIV (human immunodeficiency virus) or AIDS (acquired immunodeficiency syndrome). ? Antibiotic medicine. ? Chemotherapy medicine.  Recreational drugs, including alcohol, marijuana, and opioids.  Herbal products, including lavender and tea tree oil.  A gene that is passed along from parent to child (inherited).  Tumors in the pituitary or adrenal gland.  An overactive thyroid gland.  Certain inherited disorders, including a genetic disease that causes low testosterone in males (Klinefelter syndrome).  Cancer of the lung, kidney, liver, testicle, or gastrointestinal tract.  Conditions that cause liver or kidney failure.  Poor nutrition and starvation.  Testicle shrinking or failure (testicularatrophy).  In some cases, the cause may not be known. What increases the risk? You may have a higher risk for gynecomastia if you:  Are 50 years old or older.  Are overweight.  Abuse alcohol or other drugs.  Have a family history of gynecomastia.  What are the signs or symptoms?  Most of the time, breast enlargement is the only symptom. The enlargement may start near the  nipple, and the breast tissue may feel firm and rubbery. The breast may feel itchy, painful or tender. How is this diagnosed? This condition may be diagnosed based on:  Your symptoms.  Your medical history.  A physical exam.  Imaging tests, such as: ? An ultrasound. ? A mammogram. ? An MRI.  Blood tests.  Removal of a sample of breast tissue to be tested in a lab (biopsy).  How is this treated? Gynecomastia may go away on its own, without treatment. If gynecomastia is caused by a medical problem or drug abuse, treatment may include:  Getting treatment for the underlying medical problem or for drug abuse.  Changing or stopping medicines.  Medicines to block the effects of estrogen.  Taking a testosterone replacement.  Surgery to remove breast tissue or any lumps in your breasts.  Breast reduction surgery. This may be a possibility if you have severe or painful gynecomastia.  Follow these instructions at home:  Take over-the-counter and prescription medicines only as told by your health care provider.  Talk to your health care provider before taking any herbal medicines or diet supplements.  Do not abuse drugs or alcohol.  Keep all follow-up visits as told by your health care provider. This is important. Contact a health care provider if:  Your breast tissue grows larger or gets more swollen or painful.  You have a lump in your testicle.  You have blood or discharge coming from your nipples.  Your nipple changes shape.  You develop a hard or painful lump in your breast. This information is not intended to replace advice given to you by your health care provider. Make sure you discuss any questions you have with your   health care provider. Document Released: 09/23/2015 Document Revised: 01/06/2016 Document Reviewed: 09/23/2015 Elsevier Interactive Patient Education  2018 [employer]  

## 2018-01-28 NOTE — Progress Notes (Signed)
Subjective:  Patient ID: [name] [name], male    DOB: February 01, 1956  Age: 62 y.o. MRN: [id number]  CC: Breast Mass   HPI [name] [name]  is a 62 year old male with a history of CHF (EF 55 to 60% from echo of 01/2016), atrial fibrillation, type 2 diabetes mellitus (A1c 9.4), hypertension who presents today for follow-up of gynecomastia. At his last office visit spironolactone had been discontinued due to complaint of pain and increased size of his left breast.  He still feels a lump in his left breast and this is tender on palpation but the right breast is normal.  Past Medical History:  Diagnosis Date  . Anginal pain ([place])   . Anxiety   . Arrhythmia   . CAD (coronary artery disease), native coronary artery    a. Nonobstructive CAD by cath 2013 - diffuse distal and branch vessel CAD, no severe disease in the major coronaries, LV mild global hypokinesis, EF 45%. b. ETT-Sestamibi 5/14: EF 31%, small fixed inferior defect with no ischemia.  . Cholecystitis   . Chronic CHF ([place])    a. Mixed ICM/NICM (?EtOH). EF 35% in 2008. Echo 5/13: EF 60-65%, mod LVH, EF 45% on V gram in 12/2011. EF 12/2012: EF 50-55%, mild LVH, inferobasal HK, mild MR. ETT-Ses 5/14 EF 41%. Cardiac MRI 5/14: EF 44%, mild global HK, subepicardial delayed enhancement in nonspecific RV insertion pattern.  . COLONIC POLYPS, HX OF 12/30/2006  . Gout   . H/O atrial flutter 2007   a. Ablations in 2007, 2008.  [name] Heart murmur   . HEPATITIS B, CHRONIC 12/30/2006  . History of alcohol abuse   . History of hiatal hernia   . History of medication noncompliance   . History of nuclear stress test    Myoview 3/17: + chest pain; EF 33%, downsloping ST depression 2, V4-6, LVH with strain, no ischemia on images; intermediate risk   . HYPERCHOLESTEROLEMIA 07/11/2010  . Hypertension   . Left sciatic nerve pain since 04/2015  . LIVER FUNCTION TESTS, ABNORMAL, HX OF 12/30/2006  . MITRAL REGURGITATION 12/30/2006  . 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 PAF (paroxysmal atrial fibrillation) (HCC)    On coumadin  . Rectal bleeding 12/18/2011   Scheduled for colonoscopy.    . Shortness of breath dyspnea    with excertion  . Sleep apnea    "suppose to send mask but they never did" (05/03/2015)  . Type II diabetes mellitus ([place])     Past Surgical History:  Procedure Laterality Date  . A FLUTTER ABLATION  2007, 2008   catheter ablation   . CARDIAC CATHETERIZATION N/A 05/03/2015   Procedure: Right/Left Heart Cath and Coronary Angiography;  Surgeon: Larey Dresser, MD;  Location: [facility];  Service: Cardiovascular;  Laterality: N/A;  . CARDIAC CATHETERIZATION N/A 05/03/2015   Procedure: Coronary Stent Intervention;  Surgeon: Jettie Booze, MD;  Location: [place] [facility];  Service: Cardiovascular;  Laterality: N/A;  . CARDIAC CATHETERIZATION N/A 11/04/2015   Procedure: Left Heart Cath and Coronary Angiography;  Surgeon: Larey Dresser, MD;  Location: [place] [facility];  Service: Cardiovascular;  Laterality: N/A;  . CARDIOVERSION N/A 08/24/2015   Procedure: CARDIOVERSION;  Surgeon: Thayer Headings, MD;  Location: [place];  Service: Cardiovascular;  Laterality: N/A;  . CHOLECYSTECTOMY N/A 02/07/2016   Procedure: LAPAROSCOPIC CHOLECYSTECTOMY WITH  INTRAOPERATIVE CHOLANGIOGRAM;  Surgeon: Greer Pickerel, MD;  Location: [facility];  Service: General;  Laterality:  N/A;  . CORONARY ANGIOPLASTY  12/05/01  . LEFT HEART CATH AND CORONARY ANGIOGRAPHY N/A 12/28/2016   Procedure: Left Heart Cath and Coronary Angiography;  Surgeon: Larey Dresser, MD;  Location: [place] [facility];  Service: Cardiovascular;  Laterality: N/A;  . LEFT HEART CATHETERIZATION WITH CORONARY ANGIOGRAM N/A 12/19/2011   Procedure: LEFT HEART CATHETERIZATION WITH CORONARY ANGIOGRAM;  Surgeon: Larey Dresser, MD;  Location: [facility];  Service: Cardiovascular;  Laterality: N/A;  . OSTEOCHONDROMA EXCISION Left 1972   "took bone tumor off my  shoulder"     Outpatient Medications Prior to Visit  Medication Sig Dispense Refill  . ACCU-CHEK SOFTCLIX LANCETS lancets Use as instructed for three times daily testing of blood glucose (Patient taking differently: 1 each by Other route daily. ) 100 each 12  . acetaminophen (TYLENOL) 500 MG tablet Take 1,000 mg by mouth every 6 (six) hours as needed for headache (pain). Reported on 08/18/2015    . amLODipine (NORVASC) 5 MG tablet Take 1 tablet (5 mg total) by mouth daily. 90 tablet 3  . atorvastatin (LIPITOR) 80 MG tablet Take 1 tablet (80 mg total) by mouth daily. 90 tablet 3  . Blood Glucose Monitoring Suppl (ACCU-CHEK AVIVA PLUS) w/Device KIT Use as directed for 3 times daily testing of blood glucose (Patient taking differently: 1 strip by Other route daily. ) 1 kit 0  . carvedilol (COREG) 25 MG tablet TAKE ONE TABLET BY MOUTH TWICE DAILY WITH MEALS 60 tablet 6  . Cholecalciferol (VITAMIN D3) 2000 units TABS Take 2,000 Units by mouth daily.    . furosemide (LASIX) 20 MG tablet TAKE TWO TABLETS BY MOUTH IN THE MORNING, THEN TAKE ONE IN THE EVENING 90 tablet 6  . glucose blood (ACCU-CHEK AVIVA PLUS) test strip Use as instructed for 3 times daily testing of blood glucose (Patient taking differently: 1 each by Other route daily. ) 100 each 12  . hydrALAZINE (APRESOLINE) 50 MG tablet Take 1 tablet (50 mg total) by mouth 2 (two) times daily. 60 tablet 11  . Insulin Glargine (LANTUS SOLOSTAR) 100 UNIT/ML Solostar Pen Inject 35 Units into the skin every morning. And pen needles 1/day 30 mL 3  . isosorbide mononitrate (IMDUR) 30 MG 24 hr tablet Take 1 tablet (30 mg total) by mouth daily. 30 tablet 6  . Lancet Devices (ACCU-CHEK SOFTCLIX) lancets Use as instructed for 3 times daily testing of blood glucose 1 each 0  . losartan (COZAAR) 25 MG tablet Take 1 tablet (25 mg total) by mouth daily. 90 tablet 3  . Magnesium 200 MG TABS Take 1 tablet (200 mg total) by mouth daily. 60 each   . metFORMIN  (GLUCOPHAGE) 1000 MG tablet TAKE ONE TABLET BY MOUTH TWICE DAILY WITH MEALS 180 tablet 0  . nitroGLYCERIN (NITROSTAT) 0.4 MG SL tablet Place 1 tablet (0.4 mg total) under the tongue every 5 (five) minutes as needed for chest pain (x 3 tabs daily). Reported on 08/18/2015 100 tablet 1  . sotalol (BETAPACE) 120 MG tablet TAKE ONE TABLET BY MOUTH EVERY 12 HOURS 180 tablet 2  . TRUEPLUS LANCETS 28G MISC 1 each by Does not apply route 3 (three) times daily. (Patient taking differently: 1 each by Other route daily. ) 100 each 12  . warfarin (COUMADIN) 5 MG tablet TAKE BY MOUTH AS DIRECTED BY COUMADIN CLINIC 30 tablet 1   No facility-administered medications prior to visit.     ROS Review of Systems  Constitutional: Negative for activity  change and appetite change.  HENT: Negative for sinus pressure and sore throat.   Eyes: Negative for visual disturbance.  Respiratory: Negative for cough, chest tightness and shortness of breath.   Cardiovascular: Negative for chest pain and leg swelling.  Gastrointestinal: Negative for abdominal distention, abdominal pain, constipation and diarrhea.  Endocrine: Negative.   Genitourinary: Negative for dysuria.  Musculoskeletal: Negative for joint swelling and myalgias.  Skin: Negative for rash.  Allergic/Immunologic: Negative.   Neurological: Negative for weakness, light-headedness and numbness.  Psychiatric/Behavioral: Negative for dysphoric mood and suicidal ideas.    Objective:  BP 135/80   Pulse 80   Temp 98.7 F (37.1 C) (Oral)   Ht 6' (1.829 m)   Wt 208 lb 6.4 oz (94.5 kg)   SpO2 98%   BMI 28.26 kg/m   BP/Weight 01/28/2018 12/17/2017 1/91/6606  Systolic BP 004 599 774  Diastolic BP 80 77 90  Wt. (Lbs) 208.4 211.6 216.5  BMI 28.26 28.7 29.36      Physical Exam  Constitutional: He is oriented to person, place, and time. He appears well-developed and well-nourished.  Cardiovascular: Normal rate, normal heart sounds and intact distal pulses.  No  murmur heard. Pulmonary/Chest: Effort normal and breath sounds normal. He has no wheezes. He has no rales. He exhibits no tenderness. Right breast exhibits no mass and no tenderness. Left breast exhibits tenderness. Left breast exhibits no mass (left gynecomastia).  Abdominal: Soft. Bowel sounds are normal. He exhibits no distension and no mass. There is no tenderness.  Musculoskeletal: Normal range of motion.  Neurological: He is alert and oriented to person, place, and time.    Lab Results  Component Value Date   HGBA1C 9.4 12/17/2017    Assessment & Plan:   1. Diabetes mellitus due to underlying condition with hyperosmolarity without coma, without long-term current use of insulin (HCC) Uncontrolled with A1c of 9.4 Regimen was adjusted at his last visit No regimen change today Diabetic diet, lifestyle modifications - POCT glucose (manual entry)  2. Gynecomastia - MM Digital Diagnostic Bilat; Future - US BREAST LTD UNI LEFT INC AXILLA; Future   No orders of the defined types were placed in this encounter.   Follow-up: Return in about 3 months (around 04/30/2018) for Follow-up of diabetes mellitus and hypertension.   Charlott Rakes MD

## 2018-02-07 ENCOUNTER — Ambulatory Visit
Admission: RE | Admit: 2018-02-07 | Discharge: 2018-02-07 | Disposition: A | Discharge status: Home / Self Care | Payer: Medicare Other | Source: Ambulatory Visit | Attending: Family Medicine | Admitting: Family Medicine

## 2018-02-07 ENCOUNTER — Other Ambulatory Visit: Payer: Self-pay | Admitting: Family Medicine

## 2018-02-07 DIAGNOSIS — N62 Hypertrophy of breast: Secondary | ICD-10-CM

## 2018-02-07 DIAGNOSIS — N641 Fat necrosis of breast: Secondary | ICD-10-CM

## 2018-02-14 ENCOUNTER — Telehealth: Payer: Self-pay

## 2018-02-14 ENCOUNTER — Other Ambulatory Visit: Payer: Self-pay | Admitting: Family Medicine

## 2018-02-14 NOTE — Telephone Encounter (Signed)
Spirinolactone was discontinued at his last visit

## 2018-02-14 NOTE — Telephone Encounter (Signed)
Patient was called and informed of results. Patient states that he was informed that his mass is due to medication that he is taking. Patient was informed to see PCP prior to follow up imaging to discuss medications.

## 2018-04-07 ENCOUNTER — Ambulatory Visit: Payer: Medicare Other | Attending: Family Medicine | Admitting: Family Medicine

## 2018-04-07 ENCOUNTER — Encounter: Payer: Self-pay | Admitting: Family Medicine

## 2018-04-07 VITALS — BP 150/80 | HR 69 | Temp 98.7°F | Ht 72.0 in | Wt 209.0 lb

## 2018-04-07 DIAGNOSIS — Z23 Encounter for immunization: Secondary | ICD-10-CM

## 2018-04-07 DIAGNOSIS — Z888 Allergy status to other drugs, medicaments and biological substances status: Secondary | ICD-10-CM | POA: Insufficient documentation

## 2018-04-07 DIAGNOSIS — E0800 Diabetes mellitus due to underlying condition with hyperosmolarity without nonketotic hyperglycemic-hyperosmolar coma (NKHHC): Secondary | ICD-10-CM

## 2018-04-07 DIAGNOSIS — Z9049 Acquired absence of other specified parts of digestive tract: Secondary | ICD-10-CM | POA: Diagnosis not present

## 2018-04-07 DIAGNOSIS — Z7901 Long term (current) use of anticoagulants: Secondary | ICD-10-CM | POA: Diagnosis not present

## 2018-04-07 DIAGNOSIS — I480 Paroxysmal atrial fibrillation: Secondary | ICD-10-CM | POA: Insufficient documentation

## 2018-04-07 DIAGNOSIS — G4730 Sleep apnea, unspecified: Secondary | ICD-10-CM | POA: Diagnosis not present

## 2018-04-07 DIAGNOSIS — M109 Gout, unspecified: Secondary | ICD-10-CM | POA: Diagnosis not present

## 2018-04-07 DIAGNOSIS — Z86010 Personal history of colonic polyps: Secondary | ICD-10-CM | POA: Insufficient documentation

## 2018-04-07 DIAGNOSIS — I10 Essential (primary) hypertension: Secondary | ICD-10-CM

## 2018-04-07 DIAGNOSIS — E7800 Pure hypercholesterolemia, unspecified: Secondary | ICD-10-CM | POA: Diagnosis not present

## 2018-04-07 DIAGNOSIS — Z8619 Personal history of other infectious and parasitic diseases: Secondary | ICD-10-CM | POA: Insufficient documentation

## 2018-04-07 DIAGNOSIS — Z955 Presence of coronary angioplasty implant and graft: Secondary | ICD-10-CM | POA: Insufficient documentation

## 2018-04-07 DIAGNOSIS — E119 Type 2 diabetes mellitus without complications: Secondary | ICD-10-CM | POA: Diagnosis present

## 2018-04-07 DIAGNOSIS — Z794 Long term (current) use of insulin: Secondary | ICD-10-CM | POA: Insufficient documentation

## 2018-04-07 DIAGNOSIS — I4892 Unspecified atrial flutter: Secondary | ICD-10-CM | POA: Insufficient documentation

## 2018-04-07 DIAGNOSIS — E1100 Type 2 diabetes mellitus with hyperosmolarity without nonketotic hyperglycemic-hyperosmolar coma (NKHHC): Secondary | ICD-10-CM | POA: Insufficient documentation

## 2018-04-07 DIAGNOSIS — I110 Hypertensive heart disease with heart failure: Secondary | ICD-10-CM | POA: Insufficient documentation

## 2018-04-07 DIAGNOSIS — I5032 Chronic diastolic (congestive) heart failure: Secondary | ICD-10-CM | POA: Insufficient documentation

## 2018-04-07 DIAGNOSIS — Z79899 Other long term (current) drug therapy: Secondary | ICD-10-CM | POA: Diagnosis not present

## 2018-04-07 DIAGNOSIS — I2510 Atherosclerotic heart disease of native coronary artery without angina pectoris: Secondary | ICD-10-CM | POA: Insufficient documentation

## 2018-04-07 DIAGNOSIS — N62 Hypertrophy of breast: Secondary | ICD-10-CM | POA: Diagnosis not present

## 2018-04-07 LAB — GLUCOSE, POCT (MANUAL RESULT ENTRY): POC Glucose: 163 mg/dl — AB (ref 70–99)

## 2018-04-07 LAB — POCT GLYCOSYLATED HEMOGLOBIN (HGB A1C): Hemoglobin A1C: 7.3 % — AB (ref 4.0–5.6)

## 2018-04-07 NOTE — Patient Instructions (Signed)
Gynecomastia, Adult Gynecomastia is an overgrowth of gland tissue in a man's breasts. This may cause one or both breasts to become enlarged. This often develops in men who have an imbalance of the male sex hormone (testosterone) and the male sex hormone (estrogen). This means that a man may have too much estrogen, too little testosterone, or both. Gynecomastia may be a normal part of aging for some men. It can also happen to adolescent boys during puberty. What are the causes? Gynecomastia may be caused by:  Certain medicines, such as: ? Estrogen supplements and medicines that act like estrogen in the body. ? Medicines that keep testosterone from functioning normally in the body (testosterone-inhibiting drugs). ? Anabolic steroids. ? Medicines to treat heartburn, cancer, heart disease, mental health problems, HIV (human immunodeficiency virus) or AIDS (acquired immunodeficiency syndrome). ? Antibiotic medicine. ? Chemotherapy medicine.  Recreational drugs, including alcohol, marijuana, and opioids.  Herbal products, including lavender and tea tree oil.  A gene that is passed along from parent to child (inherited).  Tumors in the pituitary or adrenal gland.  An overactive thyroid gland.  Certain inherited disorders, including a genetic disease that causes low testosterone in males (Klinefelter syndrome).  Cancer of the lung, kidney, liver, testicle, or gastrointestinal tract.  Conditions that cause liver or kidney failure.  Poor nutrition and starvation.  Testicle shrinking or failure (testicularatrophy).  In some cases, the cause may not be known. What increases the risk? You may have a higher risk for gynecomastia if you:  Are 64 years old or older.  Are overweight.  Abuse alcohol or other drugs.  Have a family history of gynecomastia.  What are the signs or symptoms?  Most of the time, breast enlargement is the only symptom. The enlargement may start near the  nipple, and the breast tissue may feel firm and rubbery. The breast may feel itchy, painful or tender. How is this diagnosed? This condition may be diagnosed based on:  Your symptoms.  Your medical history.  A physical exam.  Imaging tests, such as: ? An ultrasound. ? A mammogram. ? An MRI.  Blood tests.  Removal of a sample of breast tissue to be tested in a lab (biopsy).  How is this treated? Gynecomastia may go away on its own, without treatment. If gynecomastia is caused by a medical problem or drug abuse, treatment may include:  Getting treatment for the underlying medical problem or for drug abuse.  Changing or stopping medicines.  Medicines to block the effects of estrogen.  Taking a testosterone replacement.  Surgery to remove breast tissue or any lumps in your breasts.  Breast reduction surgery. This may be a possibility if you have severe or painful gynecomastia.  Follow these instructions at home:  Take over-the-counter and prescription medicines only as told by your health care provider.  Talk to your health care provider before taking any herbal medicines or diet supplements.  Do not abuse drugs or alcohol.  Keep all follow-up visits as told by your health care provider. This is important. Contact a health care provider if:  Your breast tissue grows larger or gets more swollen or painful.  You have a lump in your testicle.  You have blood or discharge coming from your nipples.  Your nipple changes shape.  You develop a hard or painful lump in your breast. This information is not intended to replace advice given to you by your health care provider. Make sure you discuss any questions you have with your   health care provider. Document Released: 09/23/2015 Document Revised: 01/06/2016 Document Reviewed: 09/23/2015 [employer] Interactive Patient Education  2018 [employer]  

## 2018-04-07 NOTE — Progress Notes (Signed)
 Subjective:  Patient ID: [name] [name], male    DOB: 07/13/1956  Age: 62 y.o. MRN: [id number]  CC: Diabetes   HPI Sara T Donofrio is a 62-year-old male with a history of CHF (EF 55 to 60% from echo of 01/2016), atrial fibrillation, type 2 diabetes mellitus (A1c 7.3), hypertension who presents today for follow-up visit. Spironolactone had been discontinued previously due to complains of left gynecomastia and he endorses continuing to feel a lump in his left breast which is intermittently painful only when he touches it.  Diagnostic mammogram and ultrasound of the left breast had been performed with report as follows: IMPRESSION: 1. The area of swelling in the retroareolar left breast corresponds with benign gynecomastia.  2. There is an indeterminate mass in the left breast at 11:30, possibly representing fat necrosis.  3.  No evidence of malignancy in the bilateral breasts.  RECOMMENDATION: Three-month follow-up left breast mammogram and ultrasound is recommended for the probably benign mass 11:30.  I have discussed the findings and recommendations with the patient. Results were also provided in writing at the conclusion of the visit. If applicable, a reminder letter will be sent to the patient regarding the next appointment.  BI-RADS CATEGORY  3: Probably benign.  With regards to his congestive heart failure he denies swelling in his lower extremities, shortness of breath or chest pains.  He is on Coumadin for management of his A. fib and this is managed by the Coumadin clinic however he has not had a recent visit and has been advised to do so. His A1c 7.3 which has improved from 9.4 previously and he denies hypoglycemia, numbness in his extremities or visual concerns He denies acute concerns today.  Past Medical History:  Diagnosis Date  . Anginal pain (HCC)   . Anxiety   . Arrhythmia   . CAD (coronary artery disease), native coronary artery    a. Nonobstructive  CAD by cath 2013 - diffuse distal and branch vessel CAD, no severe disease in the major coronaries, LV mild global hypokinesis, EF 45%. b. ETT-Sestamibi 5/14: EF 31%, small fixed inferior defect with no ischemia.  . Cholecystitis   . Chronic CHF (HCC)    a. Mixed ICM/NICM (?EtOH). EF 35% in 2008. Echo 5/13: EF 60-65%, mod LVH, EF 45% on V gram in 12/2011. EF 12/2012: EF 50-55%, mild LVH, inferobasal HK, mild MR. ETT-Ses 5/14 EF 41%. Cardiac MRI 5/14: EF 44%, mild global HK, subepicardial delayed enhancement in nonspecific RV insertion pattern.  . COLONIC POLYPS, HX OF 12/30/2006  . Gout   . H/O atrial flutter 2007   a. Ablations in 2007, 2008.  . Heart murmur   . HEPATITIS B, CHRONIC 12/30/2006  . History of alcohol abuse   . History of hiatal hernia   . History of medication noncompliance   . History of nuclear stress test    Myoview 3/17: + chest pain; EF 33%, downsloping ST depression 2, V4-6, LVH with strain, no ischemia on images; intermediate risk   . HYPERCHOLESTEROLEMIA 07/11/2010  . Hypertension   . Left sciatic nerve pain since 04/2015  . LIVER FUNCTION TESTS, ABNORMAL, HX OF 12/30/2006  . MITRAL REGURGITATION 12/30/2006  . Osteochondrosarcoma (HCC) 1972   "left shoulder"  . PAF (paroxysmal atrial fibrillation) (HCC)    On coumadin  . Rectal bleeding 12/18/2011   Scheduled for colonoscopy.    . Shortness of breath dyspnea    with excertion  . Sleep apnea    "suppose to   send mask but they never did" (05/03/2015)  . Type II diabetes mellitus (HCC)     Past Surgical History:  Procedure Laterality Date  . A FLUTTER ABLATION  2007, 2008   catheter ablation   . CARDIAC CATHETERIZATION N/A 05/03/2015   Procedure: Right/Left Heart Cath and Coronary Angiography;  Surgeon: Dalton S McLean, MD;  Location: [facility];  Service: Cardiovascular;  Laterality: N/A;  . CARDIAC CATHETERIZATION N/A 05/03/2015   Procedure: Coronary Stent Intervention;  Surgeon: Jayadeep S Varanasi, MD;   Location: [facility];  Service: Cardiovascular;  Laterality: N/A;  . CARDIAC CATHETERIZATION N/A 11/04/2015   Procedure: Left Heart Cath and Coronary Angiography;  Surgeon: Dalton S McLean, MD;  Location: [facility];  Service: Cardiovascular;  Laterality: N/A;  . CARDIOVERSION N/A 08/24/2015   Procedure: CARDIOVERSION;  Surgeon: Philip J Nahser, MD;  Location: [facility];  Service: Cardiovascular;  Laterality: N/A;  . CHOLECYSTECTOMY N/A 02/07/2016   Procedure: LAPAROSCOPIC CHOLECYSTECTOMY WITH  INTRAOPERATIVE CHOLANGIOGRAM;  Surgeon: Eric Wilson, MD;  Location: [facility];  Service: General;  Laterality: N/A;  . CORONARY ANGIOPLASTY  12/05/01  . LEFT HEART CATH AND CORONARY ANGIOGRAPHY N/A 12/28/2016   Procedure: Left Heart Cath and Coronary Angiography;  Surgeon: McLean, Dalton S, MD;  Location: [facility];  Service: Cardiovascular;  Laterality: N/A;  . LEFT HEART CATHETERIZATION WITH CORONARY ANGIOGRAM N/A 12/19/2011   Procedure: LEFT HEART CATHETERIZATION WITH CORONARY ANGIOGRAM;  Surgeon: Dalton S McLean, MD;  Location: [facility];  Service: Cardiovascular;  Laterality: N/A;  . OSTEOCHONDROMA EXCISION Left 1972   "took bone tumor off my shoulder"    Allergies  Allergen Reactions  . Ace Inhibitors Cough  . Other Hives and Other (See Comments)    Patient reports developing hives after receiving "some antibiotic given in 1980''s at [facility]". He does not know which antibiotic.     Outpatient Medications Prior to Visit  Medication Sig Dispense Refill  . ACCU-CHEK SOFTCLIX LANCETS lancets Use as instructed for three times daily testing of blood glucose (Patient taking differently: 1 each by Other route daily. ) 100 each 12  . acetaminophen (TYLENOL) 500 MG tablet Take 1,000 mg by mouth every 6 (six) hours as needed for headache (pain). Reported on 08/18/2015    . amLODipine (NORVASC) 5 MG tablet Take 1 tablet (5 mg total) by mouth daily. 90 tablet 3  .  atorvastatin (LIPITOR) 80 MG tablet Take 1 tablet (80 mg total) by mouth daily. 90 tablet 3  . Blood Glucose Monitoring Suppl (ACCU-CHEK AVIVA PLUS) w/Device KIT Use as directed for 3 times daily testing of blood glucose (Patient taking differently: 1 strip by Other route daily. ) 1 kit 0  . carvedilol (COREG) 25 MG tablet TAKE ONE TABLET BY MOUTH TWICE DAILY WITH MEALS 60 tablet 6  . furosemide (LASIX) 20 MG tablet TAKE TWO TABLETS BY MOUTH IN THE MORNING, THEN TAKE ONE IN THE EVENING 90 tablet 6  . glucose blood (ACCU-CHEK AVIVA PLUS) test strip Use as instructed for 3 times daily testing of blood glucose (Patient taking differently: 1 each by Other route daily. ) 100 each 12  . hydrALAZINE (APRESOLINE) 50 MG tablet Take 1 tablet (50 mg total) by mouth 2 (two) times daily. 60 tablet 11  . Insulin Glargine (LANTUS SOLOSTAR) 100 UNIT/ML Solostar Pen Inject 35 Units into the skin every morning. And pen needles 1/day 30 mL 3  . isosorbide mononitrate (IMDUR) 30 MG   24 hr tablet Take 1 tablet (30 mg total) by mouth daily. 30 tablet 6  . Lancet Devices (ACCU-CHEK SOFTCLIX) lancets Use as instructed for 3 times daily testing of blood glucose 1 each 0  . metFORMIN (GLUCOPHAGE) 1000 MG tablet TAKE ONE TABLET BY MOUTH TWICE DAILY WITH MEALS 180 tablet 0  . nitroGLYCERIN (NITROSTAT) 0.4 MG SL tablet Place 1 tablet (0.4 mg total) under the tongue every 5 (five) minutes as needed for chest pain (x 3 tabs daily). Reported on 08/18/2015 100 tablet 1  . sotalol (BETAPACE) 120 MG tablet TAKE ONE TABLET BY MOUTH EVERY 12 HOURS 180 tablet 2  . TRUEPLUS LANCETS 28G MISC 1 each by Does not apply route 3 (three) times daily. (Patient taking differently: 1 each by Other route daily. ) 100 each 12  . warfarin (COUMADIN) 5 MG tablet TAKE BY MOUTH AS DIRECTED BY COUMADIN CLINIC 30 tablet 1  . Cholecalciferol (VITAMIN D3) 2000 units TABS Take 2,000 Units by mouth daily.    [name] losartan (COZAAR) 25 MG tablet Take 1 tablet (25 mg  total) by mouth daily. (Patient not taking: Reported on 04/07/2018) 90 tablet 3  . Magnesium 200 MG TABS Take 1 tablet (200 mg total) by mouth daily. (Patient not taking: Reported on 04/07/2018) 60 each    No facility-administered medications prior to visit.     ROS Review of Systems  Constitutional: Negative for activity change and appetite change.  HENT: Negative for sinus pressure and sore throat.   Eyes: Negative for visual disturbance.  Respiratory: Negative for cough, chest tightness and shortness of breath.   Cardiovascular: Negative for chest pain and leg swelling.  Gastrointestinal: Negative for abdominal distention, abdominal pain, constipation and diarrhea.  Endocrine: Negative.   Genitourinary: Negative for dysuria.  Musculoskeletal: Negative for joint swelling and myalgias.  Skin: Negative for rash.  Allergic/Immunologic: Negative.   Neurological: Negative for weakness, light-headedness and numbness.  Psychiatric/Behavioral: Negative for dysphoric mood and suicidal ideas.    Objective:  BP (!) 150/80   Pulse 69   Temp 98.7 F (37.1 C) (Oral)   Ht 6' (1.829 m)   Wt 209 lb (94.8 kg)   SpO2 98%   BMI 28.35 kg/m   BP/Weight 04/07/2018 1/77/9390 3/0/0923  Systolic BP 300 762 263  Diastolic BP 80 80 77  Wt. (Lbs) 209 208.4 211.6  BMI 28.35 28.26 28.7      Physical Exam  Constitutional: He is oriented to person, place, and time. He appears well-developed and well-nourished.  Cardiovascular: Normal rate, normal heart sounds and intact distal pulses.  No murmur heard. Pulmonary/Chest: Effort normal and breath sounds normal. He has no wheezes. He has no rales. He exhibits no tenderness. Right breast exhibits no mass and no tenderness. Left breast exhibits mass. Left breast exhibits no tenderness.  Abdominal: Soft. Bowel sounds are normal. He exhibits no distension and no mass. There is no tenderness.  Musculoskeletal: Normal range of motion.  Neurological: He is  alert and oriented to person, place, and time.  Skin: Skin is warm and dry.  Psychiatric: He has a normal mood and affect.    CMP Latest Ref Rng & Units 10/09/2017 07/29/2017 03/26/2017  Glucose 65 - 99 mg/dL 204(H) 147(H) 104(H)  BUN 6 - 20 mg/dL _0 Creatinine 0.61 - 1.24 mg/dL 1.25(H) 1.25 1.07  Sodium 135 - 145 mmol/L 139 142 137  Potassium 3.5 - 5.1 mmol/L 3.9 4.4 3.8  Chloride 101 - 111 mmol/L 104  106 105  CO2 22 - 32 mmol/L _0 Calcium 8.9 - 10.3 mg/dL 9.3 9.6 9.1  Total Protein 6.0 - 8.5 g/dL - 7.3 -  Total Bilirubin 0.0 - 1.2 mg/dL - 0.4 -  Alkaline Phos 39 - 117 IU/L - 57 -  AST 0 - 40 IU/L - 15 -  ALT 0 - 44 IU/L - 12 -    Lipid Panel     Component Value Date/Time   CHOL 95 03/26/2017 1132   TRIG 63 03/26/2017 1132   HDL 43 03/26/2017 1132   CHOLHDL 2.2 03/26/2017 1132   VLDL 13 03/26/2017 1132   LDLCALC 39 03/26/2017 1132   LDLDIRECT 111.0 04/22/2015 1559    Lab Results  Component Value Date   HGBA1C 7.3 (A) 04/07/2018     Assessment & Plan:   1. Diabetes mellitus due to underlying condition with hyperosmolarity without coma, without long-term current use of insulin (HCC) Improved with A1c of 7.3 which is down from 9.4 previously; commended on improvement Counseled on Diabetic diet, my plate method, 740 minutes of moderate intensity exercise/week Keep blood sugar logs with fasting goals of 80-120 mg/dl, random of less than 180 and in the event of sugars less than 60 mg/dl or greater than 400 mg/dl please notify the clinic ASAP. It is recommended that you undergo annual eye exams and annual foot exams. Pneumonia vaccine is recommended. - POCT glucose (manual entry) - POCT glycosylated hemoglobin (Hb A1C) - CMP14+EGFR; Future - Lipid panel; Future  2. Gynecomastia, male Diagnostic mammogram negative for malignancy He is scheduled for follow-up imaging next Likely secondary to the spironolactone use which has since been discontinued  3.  Chronic diastolic CHF (congestive heart failure) (HCC) EF of 55 to 60% Euvolemic Continue medications  4. Atrial flutter with controlled response ([place]) Currently on Coumadin which is monitored by the Coumadin clinic   5. Essential hypertension Slightly elevated blood pressure No regimen change today Counseled on blood pressure goal of less than 130/80, low-sodium, DASH diet, medication compliance, 150 minutes of moderate intensity exercise per week. Discussed medication compliance, adverse effects.   No orders of the defined types were placed in this encounter.   Follow-up: Return in about 3 months (around 07/08/2018) for Follow-up of chronic medical conditions.   Charlott Rakes MD

## 2018-04-08 ENCOUNTER — Ambulatory Visit: Payer: Medicare Other | Attending: Family Medicine

## 2018-04-08 DIAGNOSIS — E0800 Diabetes mellitus due to underlying condition with hyperosmolarity without nonketotic hyperglycemic-hyperosmolar coma (NKHHC): Secondary | ICD-10-CM | POA: Diagnosis present

## 2018-04-08 NOTE — Progress Notes (Signed)
Patient here for lab visit only 

## 2018-04-09 LAB — CMP14+EGFR
A/G RATIO: 1.2 (ref 1.2–2.2)
ALK PHOS: 65 IU/L (ref 39–117)
ALT: 12 IU/L (ref 0–44)
AST: 15 IU/L (ref 0–40)
Albumin: 3.9 g/dL (ref 3.6–4.8)
BILIRUBIN TOTAL: 0.4 mg/dL (ref 0.0–1.2)
BUN/Creatinine Ratio: 8 — ABNORMAL LOW (ref 10–24)
BUN: 9 mg/dL (ref 8–27)
CALCIUM: 9.1 mg/dL (ref 8.6–10.2)
CO2: 23 mmol/L (ref 20–29)
Chloride: 104 mmol/L (ref 96–106)
Creatinine, Ser: 1.13 mg/dL (ref 0.76–1.27)
GFR calc Af Amer: 79 mL/min/{1.73_m2} (ref >59)
GFR, EST NON AFRICAN AMERICAN: 68 mL/min/{1.73_m2} (ref >59)
GLOBULIN, TOTAL: 3.2 g/dL (ref 1.5–4.5)
Glucose: 147 mg/dL — ABNORMAL HIGH (ref 65–99)
POTASSIUM: 3.6 mmol/L (ref 3.5–5.2)
SODIUM: 140 mmol/L (ref 134–144)
Total Protein: 7.1 g/dL (ref 6.0–8.5)

## 2018-04-09 LAB — LIPID PANEL
CHOLESTEROL TOTAL: 139 mg/dL (ref 100–199)
Chol/HDL Ratio: 2.8 ratio (ref 0.0–5.0)
HDL: 49 mg/dL (ref >39)
LDL CALC: 68 mg/dL (ref 0–99)
TRIGLYCERIDES: 112 mg/dL (ref 0–149)
VLDL CHOLESTEROL CAL: 22 mg/dL (ref 5–40)

## 2018-04-10 ENCOUNTER — Telehealth: Payer: Self-pay

## 2018-04-10 NOTE — Telephone Encounter (Signed)
Patient was called and informed of lab results. 

## 2018-04-10 NOTE — Telephone Encounter (Signed)
-----   Message from Charlott Rakes, MD sent at 04/09/2018  1:24 PM EDT ----- Please inform the patient that labs are normal. Thank you.

## 2018-05-07 ENCOUNTER — Ambulatory Visit: Payer: Medicare Other | Admitting: Family Medicine

## 2018-05-12 ENCOUNTER — Other Ambulatory Visit: Payer: Medicare Other

## 2018-05-25 ENCOUNTER — Other Ambulatory Visit: Payer: Self-pay | Admitting: Cardiology

## 2018-05-25 DIAGNOSIS — I10 Essential (primary) hypertension: Secondary | ICD-10-CM

## 2018-05-26 NOTE — Telephone Encounter (Signed)
Pt is overdue for follow-up, last seen in June 2019.  Needs OV for refill.  Attempted to contact pt, VM full unable to leave message.

## 2018-05-27 ENCOUNTER — Ambulatory Visit (HOSPITAL_COMMUNITY)
Admission: RE | Admit: 2018-05-27 | Discharge: 2018-05-27 | Disposition: A | Discharge status: Home / Self Care | Payer: Medicare Other | Source: Ambulatory Visit | Attending: Cardiology | Admitting: Cardiology

## 2018-05-27 ENCOUNTER — Other Ambulatory Visit: Payer: Self-pay

## 2018-05-27 ENCOUNTER — Encounter (HOSPITAL_COMMUNITY): Payer: Self-pay | Admitting: Cardiology

## 2018-05-27 VITALS — BP 176/75 | HR 74 | Wt 213.2 lb

## 2018-05-27 DIAGNOSIS — Z87891 Personal history of nicotine dependence: Secondary | ICD-10-CM | POA: Insufficient documentation

## 2018-05-27 DIAGNOSIS — I5032 Chronic diastolic (congestive) heart failure: Secondary | ICD-10-CM

## 2018-05-27 DIAGNOSIS — I429 Cardiomyopathy, unspecified: Secondary | ICD-10-CM | POA: Insufficient documentation

## 2018-05-27 DIAGNOSIS — N189 Chronic kidney disease, unspecified: Secondary | ICD-10-CM | POA: Insufficient documentation

## 2018-05-27 DIAGNOSIS — I10 Essential (primary) hypertension: Secondary | ICD-10-CM | POA: Diagnosis not present

## 2018-05-27 DIAGNOSIS — Z79899 Other long term (current) drug therapy: Secondary | ICD-10-CM | POA: Diagnosis not present

## 2018-05-27 DIAGNOSIS — Z794 Long term (current) use of insulin: Secondary | ICD-10-CM | POA: Insufficient documentation

## 2018-05-27 DIAGNOSIS — I5030 Unspecified diastolic (congestive) heart failure: Secondary | ICD-10-CM | POA: Insufficient documentation

## 2018-05-27 DIAGNOSIS — E1122 Type 2 diabetes mellitus with diabetic chronic kidney disease: Secondary | ICD-10-CM | POA: Insufficient documentation

## 2018-05-27 DIAGNOSIS — I2510 Atherosclerotic heart disease of native coronary artery without angina pectoris: Secondary | ICD-10-CM | POA: Insufficient documentation

## 2018-05-27 DIAGNOSIS — M109 Gout, unspecified: Secondary | ICD-10-CM | POA: Insufficient documentation

## 2018-05-27 DIAGNOSIS — Z9119 Patient's noncompliance with other medical treatment and regimen: Secondary | ICD-10-CM | POA: Insufficient documentation

## 2018-05-27 DIAGNOSIS — G4733 Obstructive sleep apnea (adult) (pediatric): Secondary | ICD-10-CM | POA: Insufficient documentation

## 2018-05-27 DIAGNOSIS — E7800 Pure hypercholesterolemia, unspecified: Secondary | ICD-10-CM | POA: Insufficient documentation

## 2018-05-27 DIAGNOSIS — Z7901 Long term (current) use of anticoagulants: Secondary | ICD-10-CM | POA: Insufficient documentation

## 2018-05-27 DIAGNOSIS — Z8249 Family history of ischemic heart disease and other diseases of the circulatory system: Secondary | ICD-10-CM | POA: Insufficient documentation

## 2018-05-27 DIAGNOSIS — I130 Hypertensive heart and chronic kidney disease with heart failure and stage 1 through stage 4 chronic kidney disease, or unspecified chronic kidney disease: Secondary | ICD-10-CM | POA: Diagnosis not present

## 2018-05-27 DIAGNOSIS — I440 Atrioventricular block, first degree: Secondary | ICD-10-CM | POA: Insufficient documentation

## 2018-05-27 DIAGNOSIS — Z955 Presence of coronary angioplasty implant and graft: Secondary | ICD-10-CM | POA: Insufficient documentation

## 2018-05-27 DIAGNOSIS — I480 Paroxysmal atrial fibrillation: Secondary | ICD-10-CM | POA: Diagnosis present

## 2018-05-27 LAB — BASIC METABOLIC PANEL
ANION GAP: 7 (ref 5–15)
BUN: 11 mg/dL (ref 8–23)
CO2: 25 mmol/L (ref 22–32)
Calcium: 9.2 mg/dL (ref 8.9–10.3)
Chloride: 106 mmol/L (ref 98–111)
Creatinine, Ser: 1.16 mg/dL (ref 0.61–1.24)
GFR calc Af Amer: >60 mL/min (ref >60)
GLUCOSE: 241 mg/dL — AB (ref 70–99)
POTASSIUM: 4.1 mmol/L (ref 3.5–5.1)
Sodium: 138 mmol/L (ref 135–145)

## 2018-05-27 LAB — CBC
HEMATOCRIT: 46.3 % (ref 39.0–52.0)
HEMOGLOBIN: 14.4 g/dL (ref 13.0–17.0)
MCH: 26.3 pg (ref 26.0–34.0)
MCHC: 31.1 g/dL (ref 30.0–36.0)
MCV: 84.5 fL (ref 80.0–100.0)
NRBC: 0 % (ref 0.0–0.2)
Platelets: 191 10*3/uL (ref 150–400)
RBC: 5.48 MIL/uL (ref 4.22–5.81)
RDW: 15.2 % (ref 11.5–15.5)
WBC: 5.3 10*3/uL (ref 4.0–10.5)

## 2018-05-27 LAB — PROTIME-INR
INR: 2.86
Prothrombin Time: 29.6 seconds — ABNORMAL HIGH (ref 11.4–15.2)

## 2018-05-27 MED ORDER — RIVAROXABAN 20 MG PO TABS: 20.0000 mg | ORAL_TABLET | Freq: Every day | ORAL | 6 refills | Status: DC

## 2018-05-27 NOTE — Patient Instructions (Addendum)
Stop Coumadin  Start Xarelto 20 mg daily, ERIKA WILL CALL YOU AND TELL YOU WHEN TO START THIS MEDICATION  Please follow up with Doroteo Bradford, our heart failure pharmacist in 2 weeks  Keep appiontment with Dr Radford Pax on 10/31 at 7:40 AM  We will contact you in 6 months to schedule your next appointment.

## 2018-05-27 NOTE — Progress Notes (Addendum)
Patient ID: [name] [name], male   DOB: March 14, 1956, 62 y.o.   MRN: [id number] PCP: Dr. Margarita Rana Cardiology: Dr. Aundra Dubin  62 y.o. with history of HTN, diabetes, paroxysmal atrial fibrillation, and CAD presents for cardiology followup.  He was admitted in 5/13 with chest pain with exertion.  LHC was done showing global HK with EF 45% and diffuse, severe distal and branch vessel disease.  There was no interventional option, but I suspect that this disease could be causing his anginal-type pain.  Echo at that time was read as showing EF 60-65%.  He was admitted in 5/14 with hypertensive urgency and chest pain.  He ruled out for MI and BP was controlled.  His EF was 50-55% by echo.  ETT-Sestamibi done as outpatient showed small fixed inferior defect with no ischemia but EF was 31%.  He was quite hypertensive during the study.  Cardiac MRI done to confirm EF in 5/14 showed EF 44%.  He had an episode of atrial fibrillation/RVR in 3/15 but went back into NSR.  Echo in 2/16 showed EF 55-60% with moderate diastolic dysfunction.   He had a Cardiolite in 9/16 showing inferior ischemia.  He was having exertional dyspnea with less activity than normal.  I was concerned for worsened CAD, so took him for [facility] in 9/16. By RHC, filling pressures were not significantly elevated. He had a severe distal RCA stenosis that was treated with DES x 2.  Echo showed EF 55-60%.    In 10/16, he was admitted with acute cholecystitis.  He received a cholecystostomy tube.  Cholecystectomy done in 6/17.   Atrial fibrillation in 1/17, cardioverted to NSR.   Recurrent concerning chest pain in 3/17.  Repeat LHC showed diffuse distal and branch vessel disease consistent with poorly-controlled DM, but patent RCA stents.    He was admitted with atrial fibrillation in 12/17.  Started on sotalol and converted to NSR.    He had recurrent chest pain in 5/18 with abnormal Cardiolite.  Cath was done again in 5/18, showing diffuse CAD with no  good interventional options. He was given a prescription for Imdur but does not appear to have ever started it.   He returns today for followup of diastolic CHF and CAD.  He is doing well symptomatically, no exertional dyspnea or chest pain.  No palpitations.  He is in NSR today. BP is high today but he has not taken any of his medications yet.  He admits to poor medication compliance.  Says he has a hard time remembering bid meds.   ECG (personally reviewed): NSR, 1st degree AVB, QTc 461 msec  Labs (5/13): K 3.6, creatinine 0.93, LDL 85, HDL 57 Labs (7/13): K 3.9, creatinine 1.1, LDL 57, HDL 51  Labs (5/14): K 4, creatinine 1.25 Labs (8/14): K 3.7, creatinine 0.9, LDL 103, HDL 42 Labs (8/15): K 3.5 creatinine 1.0, BNP 75 Labs (2/16): K 3.8, creatinine 1.1 Labs (9/16): K 3.5, creatinine 1.03, BNP 59, HDL 40, LDL 111, TGs 333 Labs (11/16): K 3.6, creaitnine 1.07, LFTs normal, LDL 44 Labs (6/17): K 3.4, creatinine 1.52 => 1.13 Labs (1/18): K 3.9, creatinine 1.25 Labs (6/18): K 3.8, creatinine 1.21, HCT 39.7 Labs (8/18): LDL 39, HDL 43 Labs (12/18): 4.4, creatinine 1.25, LFTs normal Labs (8/19): K 3.6, creatinine 1.13, LDL 68, HDL 49  PMH: 1. DM2 2. Gout 3. HTN: Cough with ACEI.  4. Cardiomyopathy: Suspect mixed ischemic/nonischemic (?ETOH-related).  EF 35% in 2008.  Echo in 5/13 showed EF 60-65%  with moderate LVH but EF was 45% on LV-gram in 5/13.  Echo (5/14) with EF 50-55%, mild LVH, inferobasal HK, mild MR.  ETT-Sestamibi in 5/14, however, showed EF 31%.  Cardiac MRI (6/14) with EF 44%, mild global hypokinesis, subepicardial delayed enhancement in a nonspecific RV insertion site pattern.  Echo (2/16) with EF 55-60%, moderate diastolic dysfunction. RHC (9/16) with mean RA 8, PA 39/20 mean 28, mean PCWP 14, CI 2.2. Echo (9/16) with EF 55-60%, grade II diastolic dysfunction.  - Echo (6/17) with EF 55-60%, moderate LVH, grade II diastolic dysfunction, mild AI, mild MR.  5. CAD: LHC in 2010  with mild nonobstructive disease.  LHC (5/13) with diffuse distal and branch vessel disease, mild global hypokinesis and EF 45%.  ETT-Sestamibi (5/14): EF 31%, small fixed inferior defect with no ischemia.  Lexiscan Cardiolite (9/16) with EF 26%, moderate inferior defect that was partially reversible, suggesting ischemia => High risk study. LHC (9/16) with 40-50% mLAD, diffuse up to 50% distal LAD, 90% ostium of branch off ramus, 95% dRCA => DES to RCA x 2, EF 55-60%.   - LHC (3/17) with diffuse branch and distal vessel disease c/w poorly controlled DM, RCA stents patent.  - LHC (5/18): Diffuse CAD with no good interventional option. 60% mRCA, 90% dLAD, 50% serial mid ramus stenoses, ramus branches with ostial 80-90% stenoses.  6. H/o chronic HBV 7. Osteochondroma left shoulder.  8. Hyperlipidemia 9. Paroxysmal atrial fibrillation: Coumadin.  Developed cough and increased ESR with amiodarone.  DCCV in 1/17.  Recurrent atrial fibrillation in 12/17, sotalol started.  10. Atrial flutter: had ablations in 2007 and 2008.  11. OSA on CPAP.  12. Acute cholecystitis (10/16): Cholecystostomy tube placed.  Cholecystectomy 6/17.  13. CKD  SH: Married, prior smoker.  Some ETOH, occasionally heavy in past.  Does use occasional marijuana. Out of work Engineer, drilling. 2 daughters in grad school.   FH: CAD  ROS: All systems reviewed and negative except as per HPI.   Current Outpatient Medications  Medication Sig Dispense Refill  . ACCU-CHEK SOFTCLIX LANCETS lancets Use as instructed for three times daily testing of blood glucose 100 each 12  . acetaminophen (TYLENOL) 500 MG tablet Take 1,000 mg by mouth every 6 (six) hours as needed for headache (pain). Reported on 08/18/2015    . amLODipine (NORVASC) 5 MG tablet Take 1 tablet (5 mg total) by mouth daily. 90 tablet 3  . atorvastatin (LIPITOR) 80 MG tablet Take 1 tablet (80 mg total) by mouth daily. 90 tablet 3  . Blood Glucose Monitoring Suppl (ACCU-CHEK AVIVA  PLUS) w/Device KIT Use as directed for 3 times daily testing of blood glucose 1 kit 0  . carvedilol (COREG) 25 MG tablet TAKE ONE TABLET BY MOUTH TWICE DAILY WITH MEALS 60 tablet 6  . Cholecalciferol (VITAMIN D3) 2000 units TABS Take 2,000 Units by mouth daily.    . furosemide (LASIX) 20 MG tablet TAKE TWO TABLETS BY MOUTH IN THE MORNING, THEN TAKE ONE IN THE EVENING 90 tablet 6  . glucose blood (ACCU-CHEK AVIVA PLUS) test strip Use as instructed for 3 times daily testing of blood glucose 100 each 12  . hydrALAZINE (APRESOLINE) 50 MG tablet Take 1 tablet (50 mg total) by mouth 2 (two) times daily. 60 tablet 11  . Insulin Glargine (LANTUS SOLOSTAR) 100 UNIT/ML Solostar Pen Inject 35 Units into the skin every morning. And pen needles 1/day 30 mL 3  . isosorbide mononitrate (IMDUR) 30 MG 24 hr tablet Take 1  tablet (30 mg total) by mouth daily. 30 tablet 6  . Lancet Devices (ACCU-CHEK SOFTCLIX) lancets Use as instructed for 3 times daily testing of blood glucose 1 each 0  . Magnesium 200 MG TABS Take 1 tablet (200 mg total) by mouth daily. 60 each   . metFORMIN (GLUCOPHAGE) 1000 MG tablet TAKE ONE TABLET BY MOUTH TWICE DAILY WITH MEALS 180 tablet 0  . nitroGLYCERIN (NITROSTAT) 0.4 MG SL tablet Place 1 tablet (0.4 mg total) under the tongue every 5 (five) minutes as needed for chest pain (x 3 tabs daily). Reported on 08/18/2015 100 tablet 1  . sotalol (BETAPACE) 120 MG tablet TAKE ONE TABLET BY MOUTH EVERY 12 HOURS 180 tablet 2  . TRUEPLUS LANCETS 28G MISC 1 each by Does not apply route 3 (three) times daily. 100 each 12  . rivaroxaban (XARELTO) 20 MG TABS tablet Take 1 tablet (20 mg total) by mouth daily with supper. 30 tablet 6   No current facility-administered medications for this encounter.     BP (!) 176/75   Pulse 74   Wt 96.7 kg (213 lb 4 oz)   SpO2 100%   BMI 28.92 kg/m  General: NAD Neck: No JVD, no thyromegaly or thyroid nodule.  Lungs: Clear to auscultation bilaterally with normal  respiratory effort. CV: Nondisplaced PMI.  Heart regular S1/S2, no S3/S4, no murmur.  No peripheral edema.  No carotid bruit.  Normal pedal pulses.  Abdomen: Soft, nontender, no hepatosplenomegaly, no distention.  Skin: Intact without lesions or rashes.  Neurologic: Alert and oriented x 3.  Psych: Normal affect. Extremities: No clubbing or cyanosis.  HEENT: Normal.   Assessment/Plan:  Atrial fibrillation Paroxysmal atrial fibrillation. He is in NSR today.  - He has been out of warfarin for 3 days. I will transition him to Xarelto 20 mg daily. CBC today.   - Continue sotalol for maintenance of NSR.  QTc ok on today's ECG. BMET today.   Coronary artery disease LHC in 9/16 showed diffuse CAD, most significant involving the distal RCA.  Patient had DES x 2 to RCA.  Cath in 3/17 with patent RCA stents and diffuse distal and branch vessel disease concerning for poorly controlled diabetes.  Cath in 5/18 again showed diffuse CAD without good interventional target.   - Continue warfarin.  No ASA with stable CAD. - Continue atorvastatin 80 mg daily, good lipids in 8/19.  HYPERCHOLESTEROLEMIA  On atorvastatin, good lipids 8/19.   Cardiomyopathy  EF 47% on cardiac MRI in 6/14 but EF improved back to 55-60% on 2/16 echo.  However, EF down to 26% on Cardiolite in 9/16.  I think this was inaccurate.  EF was 55-60% by both LV-gram and echo in 9/16 and 55-60% on 6/17 echo.  He looks euvolemic on exam.  - Continue Lasix 40 qam/20 qpm.   Hypertension BP high but did not take am meds.  He seems to be poorly compliant in general with his medications.  We had a long discussion about the perils of uncontrolled HTN.    [facility] Needs to get back on CPAP. Has appt with Dr. Radford Pax.    CKD BMET today.   Followup in 6 months with me.  I will have him seen in the HF pharmacy clinic in 2 wks to recheck his BP when (hopefully) he has taken his medications.  Will ask the pharmacist to go over his meds with him to help  with compliance.   Loralie Champagne 05/27/2018

## 2018-06-09 ENCOUNTER — Telehealth (HOSPITAL_COMMUNITY): Payer: Self-pay | Admitting: Pharmacist

## 2018-06-09 NOTE — Telephone Encounter (Signed)
Called patient and unable to lvm on pt's phone as his mailbox is full.  Called pt's daughter, Tresa Endo on file, and left message for her to have patient call me back.  Need to reschedule pharmacy appt as Doroteo Bradford will be out of the office on Tuesday 06/10/18

## 2018-06-09 NOTE — Telephone Encounter (Signed)
Patient returned my call and is aware of rescheduled appt.06/23/18.

## 2018-06-10 ENCOUNTER — Inpatient Hospital Stay (HOSPITAL_COMMUNITY)
Admission: RE | Admit: 2018-06-10 | Discharge: 2018-06-10 | Disposition: A | Discharge status: Home / Self Care | Payer: Medicare Other | Source: Ambulatory Visit

## 2018-06-11 NOTE — Progress Notes (Deleted)
Cardiology Office Note:    Date:  06/11/2018   ID:  [name] [name], DOB [birth year], MRN [id number]  PCP:  Charlott Rakes, MD  Cardiologist:  No primary care provider on file.    Referring MD: Charlott Rakes, MD   No chief complaint on file.   History of Present Illness:    [name] [name] is a 62 y.o. male with a hx of OSA who is here today for evaluation of OSA.  He is doing well with his CPAP device.  He has mild OSA with PSG showing an AHI of 5/hr but severe during REM sleep at 70/hr with O2 sats as low as 85% c/w nocturnal hypoxemia.  He is on CPAP at 10cm H2O.  Unfortunately he was noncompliant with he device and Insurance made his turn it in. He is now back to see me so that he can get another PAP device.   Past Medical History:  Diagnosis Date  . Anginal pain ([place])   . Anxiety   . Arrhythmia   . CAD (coronary artery disease), native coronary artery    a. Nonobstructive CAD by cath 2013 - diffuse distal and branch vessel CAD, no severe disease in the major coronaries, LV mild global hypokinesis, EF 45%. b. ETT-Sestamibi 5/14: EF 31%, small fixed inferior defect with no ischemia.  . Cholecystitis   . Chronic CHF ([place])    a. Mixed ICM/NICM (?EtOH). EF 35% in 2008. Echo 5/13: EF 60-65%, mod LVH, EF 45% on V gram in 12/2011. EF 12/2012: EF 50-55%, mild LVH, inferobasal HK, mild MR. ETT-Ses 5/14 EF 41%. Cardiac MRI 5/14: EF 44%, mild global HK, subepicardial delayed enhancement in nonspecific RV insertion pattern.  . COLONIC POLYPS, HX OF 12/30/2006  . Gout   . H/O atrial flutter 2007   a. Ablations in 2007, 2008.  [name] Heart murmur   . HEPATITIS B, CHRONIC 12/30/2006  . History of alcohol abuse   . History of hiatal hernia   . History of medication noncompliance   . History of nuclear stress test    Myoview 3/17: + chest pain; EF 33%, downsloping ST depression 2, V4-6, LVH with strain, no ischemia on images; intermediate risk   . HYPERCHOLESTEROLEMIA 07/11/2010  .  Hypertension   . Left sciatic nerve pain since 04/2015  . LIVER FUNCTION TESTS, ABNORMAL, HX OF 12/30/2006  . MITRAL REGURGITATION 12/30/2006  . 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 PAF (paroxysmal atrial fibrillation) ([facility])    On coumadin  . Rectal bleeding 12/18/2011   Scheduled for colonoscopy.    . Shortness of breath dyspnea    with excertion  . Sleep apnea    "suppose to send mask but they never did" (05/03/2015)  . Type II diabetes mellitus ([place])     Past Surgical History:  Procedure Laterality Date  . A FLUTTER ABLATION  2007, 2008   catheter ablation   . CARDIAC CATHETERIZATION N/A 05/03/2015   Procedure: Right/Left Heart Cath and Coronary Angiography;  Surgeon: Larey Dresser, MD;  Location: [facility];  Service: Cardiovascular;  Laterality: N/A;  . CARDIAC CATHETERIZATION N/A 05/03/2015   Procedure: Coronary Stent Intervention;  Surgeon: Jettie Booze, MD;  Location: [facility];  Service: Cardiovascular;  Laterality: N/A;  . CARDIAC CATHETERIZATION N/A 11/04/2015   Procedure: Left Heart Cath and Coronary Angiography;  Surgeon: Larey Dresser, MD;  Location: [facility];  Service: Cardiovascular;  Laterality: N/A;  . CARDIOVERSION  N/A 08/24/2015   Procedure: CARDIOVERSION;  Surgeon: Thayer Headings, MD;  Location[facility];  Service: Cardiovascular;  Laterality: N/A;  . CHOLECYSTECTOMY N/A 02/07/2016   Procedure: LAPAROSCOPIC CHOLECYSTECTOMY WITH  INTRAOPERATIVE CHOLANGIOGRAM;  Surgeon: Greer Pickerel, MD;  Location: [facility];  Service: General;  Laterality: N/A;  . CORONARY ANGIOPLASTY  12/05/01  . LEFT HEART CATH AND CORONARY ANGIOGRAPHY N/A 12/28/2016   Procedure: Left Heart Cath and Coronary Angiography;  Surgeon: Larey Dresser, MD;  Location: [facility];  Service: Cardiovascular;  Laterality: N/A;  . LEFT HEART CATHETERIZATION WITH CORONARY ANGIOGRAM N/A 12/19/2011   Procedure: LEFT HEART CATHETERIZATION WITH CORONARY  ANGIOGRAM;  Surgeon: Larey Dresser, MD;  Location: [facility] C[facility] LAB;  Service: Cardiovascular;  Laterality: N/A;  . OSTEOCHONDROMA EXCISION Left 1972   "took bone tumor off my shoulder"    Current Medications: No outpatient medications have been marked as taking for the 06/12/18 encounter (Appointment) with Sueanne Margarita, MD.     Allergies:   Ace inhibitors and Other   Social History   Socioeconomic History  . Marital status: Widowed    Spouse name: Not on file  . Number of children: 2  . Years of education: Not on file  . Highest education level: Not on file  Occupational History    Employer: UNEMPLOYED  Social Needs  . Financial resource strain: Not on file  . Food insecurity:    Worry: Not on file    Inability: Not on file  . Transportation needs:    Medical: Not on file    Non-medical: Not on file  Tobacco Use  . Smoking status: Former Smoker    Packs/day: 4.00    Years: 18.00    Pack years: 72.00    Types: Cigarettes    Last attempt to quit: 08/14/1983    Years since quitting: 34.8  . Smokeless tobacco: Never Used  Substance and Sexual Activity  . Alcohol use: Yes    Alcohol/week: 16.0 standard drinks    Types: 6 Cans of beer, 10 Shots of liquor per week    Comment: occasionally  . Drug use: Yes    Types: Marijuana    Comment: occasionally  . Sexual activity: Never  Lifestyle  . Physical activity:    Days per week: Not on file    Minutes per session: Not on file  . Stress: Not on file  Relationships  . Social connections:    Talks on phone: Not on file    Gets together: Not on file    Attends religious service: Not on file    Active member of club or organization: Not on file    Attends meetings of clubs or organizations: Not on file    Relationship status: Not on file  Other Topics Concern  . Not on file  Social History Narrative  . Not on file     Family History: The patient's family history includes Diabetes in his mother; Hypertension in his  mother. There is no history of Heart attack or Stroke.  ROS:   Please see the history of present illness.    ROS  All other systems reviewed and negative.   EKGs/Labs/Other Studies Reviewed:    The following studies were reviewed today: none  EKG:  EKG is not ordered today.    Recent Labs: 10/09/2017: Magnesium 2.0 04/08/2018: ALT 12 05/27/2018: BUN 11; Creatinine, Ser 1.16; Hemoglobin 14.4; Platelets 191; Potassium 4.1; Sodium 138   Recent Lipid Panel  Component Value Date/Time   CHOL 139 04/08/2018 1102   TRIG 112 04/08/2018 1102   HDL 49 04/08/2018 1102   CHOLHDL 2.8 04/08/2018 1102   CHOLHDL 2.2 03/26/2017 1132   VLDL 13 03/26/2017 1132   LDLCALC 68 04/08/2018 1102   LDLDIRECT 111.0 04/22/2015 1559    Physical Exam:    VS:  There were no vitals taken for this visit.    Wt Readings from Last 3 Encounters:  05/27/18 213 lb 4 oz (96.7 kg)  04/07/18 209 lb (94.8 kg)  01/28/18 208 lb 6.4 oz (94.5 kg)     GEN:  Well nourished, well developed in no acute distress HEENT: Normal NECK: No JVD; No carotid bruits LYMPHATICS: No lymphadenopathy CARDIAC: RRR, no murmurs, rubs, gallops RESPIRATORY:  Clear to auscultation without rales, wheezing or rhonchi  ABDOMEN: Soft, non-tender, non-distended MUSCULOSKELETAL:  No edema; No deformity  SKIN: Warm and dry NEUROLOGIC:  Alert and oriented x 3 PSYCHIATRIC:  Normal affect   ASSESSMENT:    1. OSA (obstructive sleep apnea)   2. Essential hypertension   3. Overweight (BMI 25.0-29.9)    PLAN:    In order of problems listed above:  1.  OSA  2.  HTN - BP is controlled on exam today.  He will continue on carvedilol 25mg  BID, amlodipine 5mg  daily and Hydralazine 50mg  BID.   3.  Overweight - I have encouraged him to get into a routine exercise program and cut back on carbs and portions.    Medication Adjustments/Labs and Tests Ordered: Current medicines are reviewed at length with the patient today.  Concerns  regarding medicines are outlined above.  No orders of the defined types were placed in this encounter.  No orders of the defined types were placed in this encounter.   Signed, Fransico Him, MD  06/11/2018 11:34 PM    [place]

## 2018-06-12 ENCOUNTER — Ambulatory Visit: Payer: Medicare Other | Admitting: Cardiology

## 2018-06-13 ENCOUNTER — Encounter: Payer: Self-pay | Admitting: Cardiology

## 2018-06-23 ENCOUNTER — Ambulatory Visit (HOSPITAL_COMMUNITY)
Admission: RE | Admit: 2018-06-23 | Discharge: 2018-06-23 | Disposition: A | Discharge status: Home / Self Care | Payer: Medicare Other | Source: Ambulatory Visit | Attending: Cardiology | Admitting: Cardiology

## 2018-06-23 ENCOUNTER — Encounter (HOSPITAL_COMMUNITY): Payer: Self-pay

## 2018-06-23 DIAGNOSIS — E7800 Pure hypercholesterolemia, unspecified: Secondary | ICD-10-CM | POA: Diagnosis not present

## 2018-06-23 DIAGNOSIS — I129 Hypertensive chronic kidney disease with stage 1 through stage 4 chronic kidney disease, or unspecified chronic kidney disease: Secondary | ICD-10-CM | POA: Insufficient documentation

## 2018-06-23 DIAGNOSIS — I2510 Atherosclerotic heart disease of native coronary artery without angina pectoris: Secondary | ICD-10-CM | POA: Diagnosis not present

## 2018-06-23 DIAGNOSIS — E1122 Type 2 diabetes mellitus with diabetic chronic kidney disease: Secondary | ICD-10-CM | POA: Diagnosis not present

## 2018-06-23 DIAGNOSIS — Z9114 Patient's other noncompliance with medication regimen: Secondary | ICD-10-CM | POA: Diagnosis not present

## 2018-06-23 DIAGNOSIS — Z955 Presence of coronary angioplasty implant and graft: Secondary | ICD-10-CM | POA: Insufficient documentation

## 2018-06-23 DIAGNOSIS — I5032 Chronic diastolic (congestive) heart failure: Secondary | ICD-10-CM

## 2018-06-23 DIAGNOSIS — N189 Chronic kidney disease, unspecified: Secondary | ICD-10-CM | POA: Insufficient documentation

## 2018-06-23 DIAGNOSIS — I480 Paroxysmal atrial fibrillation: Secondary | ICD-10-CM | POA: Diagnosis present

## 2018-06-23 DIAGNOSIS — G4733 Obstructive sleep apnea (adult) (pediatric): Secondary | ICD-10-CM | POA: Insufficient documentation

## 2018-06-23 DIAGNOSIS — I10 Essential (primary) hypertension: Secondary | ICD-10-CM

## 2018-06-23 DIAGNOSIS — I440 Atrioventricular block, first degree: Secondary | ICD-10-CM | POA: Diagnosis not present

## 2018-06-23 MED ORDER — HYDRALAZINE HCL 50 MG PO TABS: 75.0000 mg | ORAL_TABLET | Freq: Three times a day (TID) | ORAL | 5 refills | Status: DC

## 2018-06-23 MED ORDER — ISOSORBIDE MONONITRATE ER 30 MG PO TB24: 30.0000 mg | ORAL_TABLET | Freq: Every day | ORAL | 6 refills | Status: DC

## 2018-06-23 NOTE — Progress Notes (Signed)
HF MD: [name]  HPI:  62 y.o.with history of HTN, diabetes, paroxysmal atrial fibrillation, and CAD presents for cardiology followup. He was admitted in 5/13 with chest pain with exertion. LHC was done showing global HK with EF 45% and diffuse, severe distal and branch vessel disease. There was no interventional option, but I suspect that this disease could be causing his anginal-type pain. Echo at that time was read as showing EF 60-65%. He was admitted in 5/14 with hypertensive urgency and chest pain. He ruled out for MI and BP was controlled. His EF was 50-55% by echo. ETT-Sestamibi done as outpatient showed small fixed inferior defect with no ischemia but EF was 31%. He was quite hypertensive during the study. Cardiac MRI done to confirm EF in 5/14 showed EF 44%. He had an episode of atrial fibrillation/RVR in 3/15 but went back into NSR. Echo in 2/16 showed EF 55-60% with moderate diastolic dysfunction.   He had a Cardiolite in 9/16 showing inferior ischemia. He was having exertional dyspnea with less activity than normal. I was concerned for worsened CAD, so took him for [name] in 9/16. By RHC, filling pressures were not significantly elevated. He had a severe distal RCA stenosis that was treated with DES x 2. Echo showed EF 55-60%.   In 10/16, he was admitted with acute cholecystitis. He received a cholecystostomy tube. Cholecystectomy done in 6/17.   Atrial fibrillation in 1/17, cardioverted to NSR.   Recurrent concerning chest pain in 3/17. Repeat LHC showed diffuse distal and branch vessel disease consistent with poorly-controlled DM, but patent RCA stents.   He was admitted with atrial fibrillation in 12/17. Started on sotalol and converted to NSR.   He had recurrent chest pain in 5/18 with abnormal Cardiolite. Cath was done again in 5/18, showing diffuse CAD with no good interventional options. He was given a prescription for Imdur but does not appear to have  ever started it.   He returns today for pharmacist-led HF medication titration.At last HF clinic visit on 10/15, he was switched from warfarin to Xarelto 20 mg daily. He is doing well overall but reports increased exertional dizziness and fatigue for the past week with shortness of breath during stressful situations. He does not report symptoms now but felt short of breath while walking up to clinic. He has also had more upset stomach but reports eating fairly well. He denies chest pain or swelling in extremities, and he is not on a defibrillator or a pacemaker. He is in NSR today. BP is elevated after taking his medications this morning. He admits to poor medication compliance, especially his evening doses. He also reports never starting Imdur.   Shortness of breath/dyspnea on exertion? Yes on exertion and in stressful situations  Orthopnea/PND? no  Edema? no  Lightheadedness/dizziness? Yes - increased this past week  Daily weights at home? no  Blood pressure/heart rate monitoring at home? no  Following low-sodium/fluid-restricted diet? yes  HF Medications: Carvedilol 25 mg PO BID Furosemide 40 mg QAM/20 mg QPM Hydralazine 50 mg PO BID  Has the patient been experiencing any side effects to the medications prescribed?  no  Does the patient have any problems obtaining medications due to transportation or finances?   No - [employer] Medicare  Understanding of regimen: good Understanding of indications: good Potential of compliance: fair - forgets evening doses Patient understands to avoid NSAIDs. Patient understands to avoid decongestants.   Pertinent Lab Values:  05/27/18: Serum creatinine 1.16, BUN 11, Potassium 4.1, Sodium  138  Vital Signs:  Weight: 213 lb (dry weight: 210-215 lb)  Blood pressure: 142/94 mmHg   Heart rate: 68 bpm   Assessment: 1. ChronicHFpEF (EF 47>>55-60%), due to ICM. NYHA class IIsymptoms. - Volume status stable - Increase hydralazine to  75 mg TID - Start isosorbide mononitrate 30 mg once daily - Continue Lasix40 qam/20 qpm and carvedilol 25 mg BID - Basic disease state pathophysiology, medication indication, mechanism and side effects reviewed at length with patient and he verbalized understanding  Atrial fibrillation Paroxysmal atrial fibrillation. He is in NSR today.  -Continue Xarelto 20 mg daily. - EKG today normal - Continue sotalol for maintenance of NSR  Coronary artery disease LHC in 9/16 showed diffuse CAD, most significant involving the distal RCA. Patient had DES x 2 to RCA. Cath in 3/17 with patent RCA stents and diffuse distal and branch vessel disease concerning for poorly controlled diabetes. Cath in 5/18 again showed diffuse CAD without good interventional target.  - Continue Xarelto. No ASA with stable CAD. - Continue atorvastatin 80 mg daily, good lipids in8/19.  Hypertension - BP high after taking am meds - He seems to be poorly compliant in general with his medications, he also has not started his Imdur yet.Importance of compliance discussed. - Will increase hydralazine and restart Imdur as above  HYPERCHOLESTEROLEMIA  On atorvastatin, good lipids 8/19.   OSA Needs to get back on CPAP. Pt will reschedule appt with Dr. Radford Pax.   CKD BMET WNL on 05/27/18  Plan: 1) Medication changes: Based on clinical presentation, vital signs and recent labs will start isosorbide mononitrate 30 mg once daily and increase hydralazine to 75 mg TID. 2) Labs: at next follow-up visit 3) Follow-up: pharmacist visit 07/08/18 and in 6 months with Dr. Mickel Crow K. [name], PharmD, BCPS, CPP Clinical Pharmacist Phone: [phone] 06/09/2018 11:30 AM

## 2018-06-23 NOTE — Patient Instructions (Addendum)
It was nice to meet you today!  INCREASE your hydralazine to 75 mg (1.5 tablets) THREE times daily.  PLEASE START isosorbide mononitrate 30 mg (1 tablet) ONCE DAILY.   Please check that you are taking hydralazine and isosorbide mononitrate at home and Marcina Millard the pharmacist if you are not.  Please call [facility] (Dr. Tressia Miners Turner's office) at [phone] to reschedule your appointment with her.  You are scheduled to see the pharmacist again in two weeks on 11/26 at 10:00 am.

## 2018-07-08 ENCOUNTER — Encounter: Payer: Self-pay | Admitting: Family Medicine

## 2018-07-08 ENCOUNTER — Other Ambulatory Visit: Payer: Self-pay

## 2018-07-08 ENCOUNTER — Ambulatory Visit: Payer: Medicare Other | Attending: Family Medicine | Admitting: Family Medicine

## 2018-07-08 ENCOUNTER — Inpatient Hospital Stay (HOSPITAL_COMMUNITY)
Admission: RE | Admit: 2018-07-08 | Discharge: 2018-07-08 | Disposition: A | Discharge status: Home / Self Care | Payer: Medicare Other | Source: Ambulatory Visit

## 2018-07-08 VITALS — BP 126/68 | HR 75 | Temp 98.6°F | Ht 72.0 in | Wt 211.8 lb

## 2018-07-08 DIAGNOSIS — K529 Noninfective gastroenteritis and colitis, unspecified: Secondary | ICD-10-CM | POA: Insufficient documentation

## 2018-07-08 DIAGNOSIS — E1165 Type 2 diabetes mellitus with hyperglycemia: Secondary | ICD-10-CM | POA: Diagnosis not present

## 2018-07-08 DIAGNOSIS — Z955 Presence of coronary angioplasty implant and graft: Secondary | ICD-10-CM | POA: Diagnosis not present

## 2018-07-08 DIAGNOSIS — I25110 Atherosclerotic heart disease of native coronary artery with unstable angina pectoris: Secondary | ICD-10-CM | POA: Diagnosis not present

## 2018-07-08 DIAGNOSIS — Z7901 Long term (current) use of anticoagulants: Secondary | ICD-10-CM | POA: Insufficient documentation

## 2018-07-08 DIAGNOSIS — G4730 Sleep apnea, unspecified: Secondary | ICD-10-CM | POA: Diagnosis not present

## 2018-07-08 DIAGNOSIS — E119 Type 2 diabetes mellitus without complications: Secondary | ICD-10-CM | POA: Diagnosis present

## 2018-07-08 DIAGNOSIS — E7800 Pure hypercholesterolemia, unspecified: Secondary | ICD-10-CM | POA: Diagnosis not present

## 2018-07-08 DIAGNOSIS — I5032 Chronic diastolic (congestive) heart failure: Secondary | ICD-10-CM | POA: Diagnosis not present

## 2018-07-08 DIAGNOSIS — I110 Hypertensive heart disease with heart failure: Secondary | ICD-10-CM | POA: Diagnosis not present

## 2018-07-08 DIAGNOSIS — M109 Gout, unspecified: Secondary | ICD-10-CM | POA: Diagnosis not present

## 2018-07-08 DIAGNOSIS — I480 Paroxysmal atrial fibrillation: Secondary | ICD-10-CM | POA: Diagnosis not present

## 2018-07-08 DIAGNOSIS — Z794 Long term (current) use of insulin: Secondary | ICD-10-CM | POA: Insufficient documentation

## 2018-07-08 DIAGNOSIS — B181 Chronic viral hepatitis B without delta-agent: Secondary | ICD-10-CM | POA: Diagnosis not present

## 2018-07-08 DIAGNOSIS — I4892 Unspecified atrial flutter: Secondary | ICD-10-CM | POA: Insufficient documentation

## 2018-07-08 DIAGNOSIS — Z79899 Other long term (current) drug therapy: Secondary | ICD-10-CM | POA: Diagnosis not present

## 2018-07-08 DIAGNOSIS — I340 Nonrheumatic mitral (valve) insufficiency: Secondary | ICD-10-CM | POA: Diagnosis not present

## 2018-07-08 DIAGNOSIS — F419 Anxiety disorder, unspecified: Secondary | ICD-10-CM | POA: Insufficient documentation

## 2018-07-08 LAB — POCT GLYCOSYLATED HEMOGLOBIN (HGB A1C): Hemoglobin A1C: 7.6 % — AB (ref 4.0–5.6)

## 2018-07-08 LAB — GLUCOSE, POCT (MANUAL RESULT ENTRY): POC Glucose: 188 mg/dl — AB (ref 70–99)

## 2018-07-08 MED ORDER — METFORMIN HCL 1000 MG PO TABS: 1000.0000 mg | ORAL_TABLET | Freq: Two times a day (BID) | ORAL | 1 refills | Status: DC

## 2018-07-08 MED ORDER — INSULIN GLARGINE 100 UNIT/ML SOLOSTAR PEN: 35.0000 [IU] | PEN_INJECTOR | SUBCUTANEOUS | 6 refills | Status: DC

## 2018-07-08 NOTE — Progress Notes (Signed)
Subjective:  Patient ID: [name] [name], male    DOB: September 22, 1955  Age: 62 y.o. MRN: [id number]  CC: Diabetes   HPI [name] [name]  is a 62 year old male with a history of CHF (EF 55 to 60% from echo of 01/2016), atrial fibrillation, type 2 diabetes mellitus (A1c 7.6), hypertension who presents today for follow-up visit. He has had diarrhea for the last 4 to 5 days with the last episode yesterday this has been associated with episodes of abdominal cramping but no vomiting, fever, myalgias.  He endorses eating fast foods and last ate is 7 by the above 5 days ago. He has also had some intermittent chest pain at rest but this is absent at this time.  Dyspnea occurs when he walks about 20 feet and he is unsure of an irregular heartbeat. At his last visit with cardiology Coumadin was switched to Xarelto last month. Denies pedal edema or weight gain.  With regards to his diabetes mellitus his A1c 7.6 which is up from 7.3 previously and he endorses forgetting to take his insulins sometimes. Doing well on his antihypertensives.  Past Medical History:  Diagnosis Date  . Anginal pain ([place])   . Anxiety   . Arrhythmia   . CAD (coronary artery disease), native coronary artery    a. Nonobstructive CAD by cath 2013 - diffuse distal and branch vessel CAD, no severe disease in the major coronaries, LV mild global hypokinesis, EF 45%. b. ETT-Sestamibi 5/14: EF 31%, small fixed inferior defect with no ischemia.  . Cholecystitis   . Chronic CHF ([place])    a. Mixed ICM/NICM (?EtOH). EF 35% in 2008. Echo 5/13: EF 60-65%, mod LVH, EF 45% on V gram in 12/2011. EF 12/2012: EF 50-55%, mild LVH, inferobasal HK, mild MR. ETT-Ses 5/14 EF 41%. Cardiac MRI 5/14: EF 44%, mild global HK, subepicardial delayed enhancement in nonspecific RV insertion pattern.  . COLONIC POLYPS, HX OF 12/30/2006  . Gout   . H/O atrial flutter 2007   a. Ablations in 2007, 2008.  [name] Heart murmur   . HEPATITIS B, CHRONIC 12/30/2006  .  History of alcohol abuse   . History of hiatal hernia   . History of medication noncompliance   . History of nuclear stress test    Myoview 3/17: + chest pain; EF 33%, downsloping ST depression 2, V4-6, LVH with strain, no ischemia on images; intermediate risk   . HYPERCHOLESTEROLEMIA 07/11/2010  . Hypertension   . Left sciatic nerve pain since 04/2015  . LIVER FUNCTION TESTS, ABNORMAL, HX OF 12/30/2006  . MITRAL REGURGITATION 12/30/2006  . 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 PAF (paroxysmal atrial fibrillation) (HCC)    On coumadin  . Rectal bleeding 12/18/2011   Scheduled for colonoscopy.    . Shortness of breath dyspnea    with excertion  . Sleep apnea    "suppose to send mask but they never did" (05/03/2015)  . Type II diabetes mellitus ([place])     Past Surgical History:  Procedure Laterality Date  . A FLUTTER ABLATION  2007, 2008   catheter ablation   . CARDIAC CATHETERIZATION N/A 05/03/2015   Procedure: Right/Left Heart Cath and Coronary Angiography;  Surgeon: Larey Dresser, MD;  Location: [place] [facility];  Service: Cardiovascular;  Laterality: N/A;  . CARDIAC CATHETERIZATION N/A 05/03/2015   Procedure: Coronary Stent Intervention;  Surgeon: Jettie Booze, MD;  Location: [place] [facility];  Service: Cardiovascular;  Laterality: N/A;  .  CARDIAC CATHETERIZATION N/A 11/04/2015   Procedure: Left Heart Cath and Coronary Angiography;  Surgeon: Larey Dresser, MD;  Location: [place] [facility] [facility];  Service: Cardiovascular;  Laterality: N/A;  . CARDIOVERSION N/A 08/24/2015   Procedure: CARDIOVERSION;  Surgeon: Thayer Headings, MD;  Location: [facility] ENDOSCOPY;  Service: Cardiovascular;  Laterality: N/A;  . CHOLECYSTECTOMY N/A 02/07/2016   Procedure: LAPAROSCOPIC CHOLECYSTECTOMY WITH  INTRAOPERATIVE CHOLANGIOGRAM;  Surgeon: Greer Pickerel, MD;  Location: [facility];  Service: General;  Laterality: N/A;  . CORONARY ANGIOPLASTY  12/05/01  . LEFT HEART CATH AND CORONARY ANGIOGRAPHY  N/A 12/28/2016   Procedure: Left Heart Cath and Coronary Angiography;  Surgeon: Larey Dresser, MD;  Location: [place] [facility];  Service: Cardiovascular;  Laterality: N/A;  . LEFT HEART CATHETERIZATION WITH CORONARY ANGIOGRAM N/A 12/19/2011   Procedure: LEFT HEART CATHETERIZATION WITH CORONARY ANGIOGRAM;  Surgeon: Larey Dresser, MD;  Location: [facility] CATH LAB;  Service: Cardiovascular;  Laterality: N/A;  . OSTEOCHONDROMA EXCISION Left 1972   "took bone tumor off my shoulder"    Allergies  Allergen Reactions  . Ace Inhibitors Cough  . Other Hives and Other (See Comments)    Patient reports developing hives after receiving "some antibiotic given in 1980''s at [facility]". He does not know which antibiotic.      Outpatient Medications Prior to Visit  Medication Sig Dispense Refill  . ACCU-CHEK SOFTCLIX LANCETS lancets Use as instructed for three times daily testing of blood glucose 100 each 12  . acetaminophen (TYLENOL) 500 MG tablet Take 1,000 mg by mouth every 6 (six) hours as needed for headache (pain). Reported on 08/18/2015    . amLODipine (NORVASC) 5 MG tablet Take 1 tablet (5 mg total) by mouth daily. 90 tablet 3  . atorvastatin (LIPITOR) 80 MG tablet Take 1 tablet (80 mg total) by mouth daily. 90 tablet 3  . Blood Glucose Monitoring Suppl (ACCU-CHEK AVIVA PLUS) w/Device KIT Use as directed for 3 times daily testing of blood glucose 1 kit 0  . carvedilol (COREG) 25 MG tablet TAKE ONE TABLET BY MOUTH TWICE DAILY WITH MEALS 60 tablet 6  . furosemide (LASIX) 20 MG tablet TAKE TWO TABLETS BY MOUTH IN THE MORNING, THEN TAKE ONE IN THE EVENING 90 tablet 6  . glucose blood (ACCU-CHEK AVIVA PLUS) test strip Use as instructed for 3 times daily testing of blood glucose 100 each 12  . hydrALAZINE (APRESOLINE) 50 MG tablet Take 1.5 tablets (75 mg total) by mouth 3 (three) times daily. 135 tablet 5  . isosorbide mononitrate (IMDUR) 30 MG 24 hr tablet Take 1 tablet (30 mg total) by mouth  daily. 30 tablet 6  . Lancet Devices (ACCU-CHEK SOFTCLIX) lancets Use as instructed for 3 times daily testing of blood glucose 1 each 0  . Melatonin 3 MG TABS Take 6 mg by mouth daily.    . rivaroxaban (XARELTO) 20 MG TABS tablet Take 1 tablet (20 mg total) by mouth daily with supper. 30 tablet 6  . sotalol (BETAPACE) 120 MG tablet TAKE ONE TABLET BY MOUTH EVERY 12 HOURS 180 tablet 2  . TRUEPLUS LANCETS 28G MISC 1 each by Does not apply route 3 (three) times daily. 100 each 12  . Insulin Glargine (LANTUS SOLOSTAR) 100 UNIT/ML Solostar Pen Inject 35 Units into the skin every morning. And pen needles 1/day 30 mL 3  . metFORMIN (GLUCOPHAGE) 1000 MG tablet TAKE ONE TABLET BY MOUTH TWICE DAILY WITH MEALS 180 tablet 0  . nitroGLYCERIN (  NITROSTAT) 0.4 MG SL tablet Place 1 tablet (0.4 mg total) under the tongue every 5 (five) minutes as needed for chest pain (x 3 tabs daily). Reported on 08/18/2015 (Patient not taking: Reported on 06/23/2018) 100 tablet 1   No facility-administered medications prior to visit.     ROS Review of Systems  Constitutional: Negative for activity change and appetite change.  HENT: Negative for sinus pressure and sore throat.   Eyes: Negative for visual disturbance.  Respiratory: Positive for shortness of breath. Negative for cough and chest tightness.   Cardiovascular: Positive for chest pain. Negative for leg swelling.  Gastrointestinal: Positive for abdominal pain and diarrhea. Negative for abdominal distention and constipation.  Endocrine: Negative.   Genitourinary: Negative for dysuria.  Musculoskeletal: Negative for joint swelling and myalgias.  Skin: Negative for rash.  Allergic/Immunologic: Negative.   Neurological: Negative for weakness, light-headedness and numbness.  Psychiatric/Behavioral: Negative for dysphoric mood and suicidal ideas.    Objective:  BP 126/68   Pulse 75   Temp 98.6 F (37 C) (Oral)   Ht 6' (1.829 m)   Wt 211 lb 12.8 oz (96.1 kg)    SpO2 98%   BMI 28.73 kg/m   BP/Weight 07/08/2018 06/23/2018 19/50/9326  Systolic BP 712 458 099  Diastolic BP 68 94 75  Wt. (Lbs) 211.8 213 213.25  BMI 28.73 28.89 28.92      Physical Exam  Constitutional: He is oriented to person, place, and time. He appears well-developed and well-nourished.  Cardiovascular: Normal rate, normal heart sounds and intact distal pulses.  No murmur heard. Pulmonary/Chest: Effort normal and breath sounds normal. He has no wheezes. He has no rales. He exhibits no tenderness.  Abdominal: Soft. Bowel sounds are normal. He exhibits no distension and no mass. There is no tenderness.  Musculoskeletal: Normal range of motion.  Neurological: He is alert and oriented to person, place, and time.  Skin: Skin is warm and dry.  Psychiatric: He has a normal mood and affect.    CMP Latest Ref Rng & Units 05/27/2018 04/08/2018 10/09/2017  Glucose 70 - 99 mg/dL 241(H) 147(H) 204(H)  BUN 8 - 23 mg/dL '11 9 11  ' Creatinine 0.61 - 1.24 mg/dL 1.16 1.13 1.25(H)  Sodium 135 - 145 mmol/L 138 140 139  Potassium 3.5 - 5.1 mmol/L 4.1 3.6 3.9  Chloride 98 - 111 mmol/L 106 104 104  CO2 22 - 32 mmol/L '25 23 25  ' Calcium 8.9 - 10.3 mg/dL 9.2 9.1 9.3  Total Protein 6.0 - 8.5 g/dL - 7.1 -  Total Bilirubin 0.0 - 1.2 mg/dL - 0.4 -  Alkaline Phos 39 - 117 IU/L - 65 -  AST 0 - 40 IU/L - 15 -  ALT 0 - 44 IU/L - 12 -    Lipid Panel     Component Value Date/Time   CHOL 139 04/08/2018 1102   TRIG 112 04/08/2018 1102   HDL 49 04/08/2018 1102   CHOLHDL 2.8 04/08/2018 1102   CHOLHDL 2.2 03/26/2017 1132   VLDL 13 03/26/2017 1132   LDLCALC 68 04/08/2018 1102   LDLDIRECT 111.0 04/22/2015 1559    Lab Results  Component Value Date   HGBA1C 7.6 (A) 07/08/2018    Assessment & Plan:   1. Type 2 diabetes mellitus without complication, with long-term current use of insulin (HCC) Not at goal with A1c of 7.6 which has trended up from 7.3 previously He promises to work on lifestyle  modifications and diet Continue current regimen - POCT  glucose (manual entry) - POCT glycosylated hemoglobin (Hb A1C) - Insulin Glargine (LANTUS SOLOSTAR) 100 UNIT/ML Solostar Pen; Inject 35 Units into the skin every morning. And pen needles 1/day  Dispense: 30 mL; Refill: 6 - metFORMIN (GLUCOPHAGE) 1000 MG tablet; Take 1 tablet (1,000 mg total) by mouth 2 (two) times daily with a meal.  Dispense: 180 tablet; Refill: 1  2. Atrial flutter with controlled response ([place]) Currently on anticoagulation with Xarelto  3. Chronic diastolic CHF (congestive heart failure) (HCC) NYHA III EF 55 to 60% from 01/2016 Euvolemic Continue fluid restriction, beta-blocker,  hydralazine and BiDil; not on ACEI due to allergy Keep appointment with cardiologist  4. Coronary artery disease involving native coronary artery of native heart with unstable angina pectoris ([place]) He has had intermittent chest pains which is absent at this time EKG reveals normal sinus rhythm, LVH with repolarization abnormality, prolonged QT Use nitroglycerin as needed chest pains and ED precautions discussed Follow-up with cardiology  5.  Gastroenteritis Improving Discussed BRAT diet   Meds ordered this encounter  Medications  . Insulin Glargine (LANTUS SOLOSTAR) 100 UNIT/ML Solostar Pen    Sig: Inject 35 Units into the skin every morning. And pen needles 1/day    Dispense:  30 mL    Refill:  6  . metFORMIN (GLUCOPHAGE) 1000 MG tablet    Sig: Take 1 tablet (1,000 mg total) by mouth 2 (two) times daily with a meal.    Dispense:  180 tablet    Refill:  1    Follow-up: Return in about 3 months (around 10/08/2018) for Follow-up of chronic medical conditions.   Charlott Rakes MD

## 2018-07-08 NOTE — Patient Instructions (Signed)
Diabetes Mellitus and Nutrition When you have diabetes (diabetes mellitus), it is very important to have healthy eating habits because your blood sugar (glucose) levels are greatly affected by what you eat and drink. Eating healthy foods in the appropriate amounts, at about the same times every day, can help you:  Control your blood glucose.  Lower your risk of heart disease.  Improve your blood pressure.  Reach or maintain a healthy weight.  Every person with diabetes is different, and each person has different needs for a meal plan. Your health care provider may recommend that you work with a diet and nutrition specialist (dietitian) to make a meal plan that is best for you. Your meal plan may vary depending on factors such as:  The calories you need.  The medicines you take.  Your weight.  Your blood glucose, blood pressure, and cholesterol levels.  Your activity level.  Other health conditions you have, such as heart or kidney disease.  How do carbohydrates affect me? Carbohydrates affect your blood glucose level more than any other type of food. Eating carbohydrates naturally increases the amount of glucose in your blood. Carbohydrate counting is a method for keeping track of how many carbohydrates you eat. Counting carbohydrates is important to keep your blood glucose at a healthy level, especially if you use insulin or take certain oral diabetes medicines. It is important to know how many carbohydrates you can safely have in each meal. This is different for every person. Your dietitian can help you calculate how many carbohydrates you should have at each meal and for snack. Foods that contain carbohydrates include:  Bread, cereal, rice, pasta, and crackers.  Potatoes and corn.  Peas, beans, and lentils.  Milk and yogurt.  Fruit and juice.  Desserts, such as cakes, cookies, ice cream, and candy.  How does alcohol affect me? Alcohol can cause a sudden decrease in blood  glucose (hypoglycemia), especially if you use insulin or take certain oral diabetes medicines. Hypoglycemia can be a life-threatening condition. Symptoms of hypoglycemia (sleepiness, dizziness, and confusion) are similar to symptoms of having too much alcohol. If your health care provider says that alcohol is safe for you, follow these guidelines:  Limit alcohol intake to no more than 1 drink per day for nonpregnant women and 2 drinks per day for men. One drink equals 12 oz of beer, 5 oz of wine, or 1 oz of hard liquor.  Do not drink on an empty stomach.  Keep yourself hydrated with water, diet soda, or unsweetened iced tea.  Keep in mind that regular soda, juice, and other mixers may contain a lot of sugar and must be counted as carbohydrates.  What are tips for following this plan? Reading food labels  Start by checking the serving size on the label. The amount of calories, carbohydrates, fats, and other nutrients listed on the label are based on one serving of the food. Many foods contain more than one serving per package.  Check the total grams (g) of carbohydrates in one serving. You can calculate the number of servings of carbohydrates in one serving by dividing the total carbohydrates by 15. For example, if a food has 30 g of total carbohydrates, it would be equal to 2 servings of carbohydrates.  Check the number of grams (g) of saturated and trans fats in one serving. Choose foods that have low or no amount of these fats.  Check the number of milligrams (mg) of sodium in one serving. Most people   should limit total sodium intake to less than 2,300 mg per day.  Always check the nutrition information of foods labeled as "low-fat" or "nonfat". These foods may be higher in added sugar or refined carbohydrates and should be avoided.  Talk to your dietitian to identify your daily goals for nutrients listed on the label. Shopping  Avoid buying canned, premade, or processed foods. These  foods tend to be high in fat, sodium, and added sugar.  Shop around the outside edge of the grocery store. This includes fresh fruits and vegetables, bulk grains, fresh meats, and fresh dairy. Cooking  Use low-heat cooking methods, such as baking, instead of high-heat cooking methods like deep frying.  Cook using healthy oils, such as olive, canola, or sunflower oil.  Avoid cooking with butter, cream, or high-fat meats. Meal planning  Eat meals and snacks regularly, preferably at the same times every day. Avoid going long periods of time without eating.  Eat foods high in fiber, such as fresh fruits, vegetables, beans, and whole grains. Talk to your dietitian about how many servings of carbohydrates you can eat at each meal.  Eat 4-6 ounces of lean protein each day, such as lean meat, chicken, fish, eggs, or tofu. 1 ounce is equal to 1 ounce of meat, chicken, or fish, 1 egg, or 1/4 cup of tofu.  Eat some foods each day that contain healthy fats, such as avocado, nuts, seeds, and fish. Lifestyle   Check your blood glucose regularly.  Exercise at least 30 minutes 5 or more days each week, or as told by your health care provider.  Take medicines as told by your health care provider.  Do not use any products that contain nicotine or tobacco, such as cigarettes and e-cigarettes. If you need help quitting, ask your health care provider.  Work with a counselor or diabetes educator to identify strategies to manage stress and any emotional and social challenges. What are some questions to ask my health care provider?  Do I need to meet with a diabetes educator?  Do I need to meet with a dietitian?  What number can I call if I have questions?  When are the best times to check my blood glucose? Where to find more information:  American Diabetes Association: diabetes.org/food-and-fitness/food  Academy of Nutrition and Dietetics:  www.eatright.org/resources/health/diseases-and-conditions/diabetes  National Institute of Diabetes and Digestive and Kidney Diseases (NIH): www.niddk.nih.gov/health-information/diabetes/overview/diet-eating-physical-activity Summary  A healthy meal plan will help you control your blood glucose and maintain a healthy lifestyle.  Working with a diet and nutrition specialist (dietitian) can help you make a meal plan that is best for you.  Keep in mind that carbohydrates and alcohol have immediate effects on your blood glucose levels. It is important to count carbohydrates and to use alcohol carefully. This information is not intended to replace advice given to you by your health care provider. Make sure you discuss any questions you have with your health care provider. Document Released: 04/26/2005 Document Revised: 09/03/2016 Document Reviewed: 09/03/2016 Elsevier Interactive Patient Education  2018 [employer]  

## 2018-08-15 ENCOUNTER — Encounter: Payer: Self-pay | Admitting: Family Medicine

## 2018-08-15 ENCOUNTER — Ambulatory Visit: Payer: Medicare Other | Attending: Family Medicine | Admitting: Family Medicine

## 2018-08-15 VITALS — BP 134/68 | HR 74 | Temp 98.2°F | Resp 18 | Ht 72.0 in | Wt 211.0 lb

## 2018-08-15 DIAGNOSIS — J069 Acute upper respiratory infection, unspecified: Secondary | ICD-10-CM | POA: Diagnosis not present

## 2018-08-15 DIAGNOSIS — R05 Cough: Secondary | ICD-10-CM | POA: Diagnosis not present

## 2018-08-15 DIAGNOSIS — R0981 Nasal congestion: Secondary | ICD-10-CM

## 2018-08-15 DIAGNOSIS — J209 Acute bronchitis, unspecified: Secondary | ICD-10-CM | POA: Diagnosis not present

## 2018-08-15 MED ORDER — DOXYCYCLINE HYCLATE 100 MG PO TABS: 100.0000 mg | ORAL_TABLET | Freq: Two times a day (BID) | ORAL | 0 refills | Status: DC

## 2018-08-15 NOTE — Progress Notes (Signed)
Subjective:    Patient ID: [name] [name], male    DOB: [birth year], 63 y.o.   MRN: [id number]  HPI       63 yo male seen for same day sick visit due to cough, chest congestion and headache.  Patient reports that he has not felt well for 1 to 1-1/2 weeks.  Patient has had a recurrent cough, sensation of chest congestion, nasal congestion with postnasal drainage and a dull frontal headache.  Patient believes that at the beginning of the illness he may have also had some fever and chills.  Patient states that today so far is the best that he has felt.  Patient has also had some shortness of breath with exertion.  Patient denies any chest pain.  Patient denies chest pain or abdominal pain but states that his stomach feels slightly upset and he has had to have a bowel movement after eating for the past week.  Patient has had diarrhea or loose stools and no nausea. Patient does have a history of smoking cigarettes for about 30 years.   Past Medical History:  Diagnosis Date  . Anginal pain ([place])   . Anxiety   . Arrhythmia   . CAD (coronary artery disease), native coronary artery    a. Nonobstructive CAD by cath 2013 - diffuse distal and branch vessel CAD, no severe disease in the major coronaries, LV mild global hypokinesis, EF 45%. b. ETT-Sestamibi 5/14: EF 31%, small fixed inferior defect with no ischemia.  . Cholecystitis   . Chronic CHF ([place])    a. Mixed ICM/NICM (?EtOH). EF 35% in 2008. Echo 5/13: EF 60-65%, mod LVH, EF 45% on V gram in 12/2011. EF 12/2012: EF 50-55%, mild LVH, inferobasal HK, mild MR. ETT-Ses 5/14 EF 41%. Cardiac MRI 5/14: EF 44%, mild global HK, subepicardial delayed enhancement in nonspecific RV insertion pattern.  . COLONIC POLYPS, HX OF 12/30/2006  . Gout   . H/O atrial flutter 2007   a. Ablations in 2007, 2008.  [name] Heart murmur   . HEPATITIS B, CHRONIC 12/30/2006  . History of alcohol abuse   . History of hiatal hernia   . History of medication noncompliance   . History  of nuclear stress test    Myoview 3/17: + chest pain; EF 33%, downsloping ST depression 2, V4-6, LVH with strain, no ischemia on images; intermediate risk   . HYPERCHOLESTEROLEMIA 07/11/2010  . Hypertension   . Left sciatic nerve pain since 04/2015  . LIVER FUNCTION TESTS, ABNORMAL, HX OF 12/30/2006  . MITRAL REGURGITATION 12/30/2006  . 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 PAF (paroxysmal atrial fibrillation) ([facility])    On coumadin  . Rectal bleeding 12/18/2011   Scheduled for colonoscopy.    . Shortness of breath dyspnea    with excertion  . Sleep apnea    "suppose to send mask but they never did" (05/03/2015)  . Type II diabetes mellitus ([place])    Past Surgical History:  Procedure Laterality Date  . A FLUTTER ABLATION  2007, 2008   catheter ablation   . CARDIAC CATHETERIZATION N/A 05/03/2015   Procedure: Right/Left Heart Cath and Coronary Angiography;  Surgeon: Larey Dresser, MD;  Location: [place] [facility];  Service: Cardiovascular;  Laterality: N/A;  . CARDIAC CATHETERIZATION N/A 05/03/2015   Procedure: Coronary Stent Intervention;  Surgeon: Jettie Booze, MD;  Location: [place] [facility];  Service: Cardiovascular;  Laterality: N/A;  . CARDIAC CATHETERIZATION N/A 11/04/2015  Procedure: Left Heart Cath and Coronary Angiography;  Surgeon: Larey Dresser, MD;  Location: [place] [facility];  Service: Cardiovascular;  Laterality: N/A;  . CARDIOVERSION N/A 08/24/2015   Procedure: CARDIOVERSION;  Surgeon: Thayer Headings, MD;  Location: [facility] ENDOSCOPY;  Service: Cardiovascular;  Laterality: N/A;  . CHOLECYSTECTOMY N/A 02/07/2016   Procedure: LAPAROSCOPIC CHOLECYSTECTOMY WITH  INTRAOPERATIVE CHOLANGIOGRAM;  Surgeon: Greer Pickerel, MD;  Location: [facility];  Service: General;  Laterality: N/A;  . CORONARY ANGIOPLASTY  12/05/01  . LEFT HEART CATH AND CORONARY ANGIOGRAPHY N/A 12/28/2016   Procedure: Left Heart Cath and Coronary Angiography;  Surgeon: Larey Dresser, MD;  Location:  [place] [facility];  Service: Cardiovascular;  Laterality: N/A;  . LEFT HEART CATHETERIZATION WITH CORONARY ANGIOGRAM N/A 12/19/2011   Procedure: LEFT HEART CATHETERIZATION WITH CORONARY ANGIOGRAM;  Surgeon: Larey Dresser, MD;  [facility] CATH LAB;  Service: Cardiovascular;  Laterality: N/A;  . OSTEOCHONDROMA EXCISION Left 1972   "took bone tumor off my shoulder"   Family History  Problem Relation Age of Onset  . Diabetes Mother   . Hypertension Mother   . Heart attack Neg Hx   . Stroke Neg Hx    Social History   Tobacco Use  . Smoking status: Former Smoker    Packs/day: 4.00    Years: 18.00    Pack years: 72.00    Types: Cigarettes    Last attempt to quit: 08/14/1983    Years since quitting: 35.0  . Smokeless tobacco: Never Used  Substance Use Topics  . Alcohol use: Yes    Alcohol/week: 16.0 standard drinks    Types: 6 Cans of beer, 10 Shots of liquor per week    Comment: occasionally  . Drug use: Yes    Types: Marijuana    Comment: occasionally   Allergies  Allergen Reactions  . Ace Inhibitors Cough  . Other Hives and Other (See Comments)    Patient reports developing hives after receiving "some antibiotic given in 1980''s at [facility]". He does not know which antibiotic.      Review of Systems  Constitutional: Positive for chills, fatigue and fever.  HENT: Positive for congestion, postnasal drip and rhinorrhea. Negative for ear pain and sore throat.   Respiratory: Positive for cough and shortness of breath.   Cardiovascular: Negative for chest pain and palpitations.  Gastrointestinal: Positive for diarrhea. Negative for abdominal pain, constipation and nausea.  Genitourinary: Negative for dysuria and frequency.  Neurological: Positive for headaches. Negative for dizziness and light-headedness.       Objective:   Physical Exam BP 134/68 (BP Location: Left Arm, Patient Position: Sitting, Cuff Size: Large)   Pulse 74   Temp 98.2 F (36.8 C) (Oral)    Resp 18   Ht 6' (1.829 m)   Wt 211 lb (95.7 kg)   SpO2 98%   BMI 28.62 kg/m nurse's notes and vital signs reviewed Gen- WNWD overweight male in NAD but patient with a nasal quality to his voice/hoarseness ENT- TM's dull and injected bilaterally, patient with moderate edema of the nasal turbinates with thick mucoid gray to clear nasal discharge, patient with some posterior pharynx erythema and cobblestoning Neck- supple, no LAD Lungs- decreased BS and decreased air movement at the lung bases but no coarse BS or wheeze; breathing is unlabored CV-regular rate and regular rhythm ABD- mild truncal obesity, soft and non-tender Back- no CVA tenderness      Assessment & Plan:  1. Acute bronchitis,  unspecified organism Patient is a former smoker and may have COPD exacerbation versus bronchitis.  Prescription for doxycycline 100 mg twice daily x10 days.  Over-the-counter Robitussin-DM to help with cough and congestion - doxycycline (VIBRA-TABS) 100 MG tablet; Take 1 tablet (100 mg total) by mouth 2 (two) times daily.  Dispense: 20 tablet; Refill: 0  2. URI with cough and congestion May take over-the-counter Robitussin DM to help with cough and congestion.  Patient may also take over-the-counter antihistamines but should avoid the use of decongestants which can raise the blood pressure.  An After Visit Summary was printed and given to the patient.  Return if symptoms worsen or fail to improve, for next week if not better.

## 2018-08-15 NOTE — Patient Instructions (Signed)
Acute Bronchitis, Adult Acute bronchitis is when air tubes (bronchi) in the lungs suddenly get swollen. The condition can make it hard to breathe. It can also cause these symptoms:  A cough.  Coughing up clear, yellow, or green mucus.  Wheezing.  Chest congestion.  Shortness of breath.  A fever.  Body aches.  Chills.  A sore throat. Follow these instructions at home:  Medicines  Take over-the-counter and prescription medicines only as told by your doctor.  If you were prescribed an antibiotic medicine, take it as told by your doctor. Do not stop taking the antibiotic even if you start to feel better. General instructions  Rest.  Drink enough fluids to keep your pee (urine) pale yellow.  Avoid smoking and secondhand smoke. If you smoke and you need help quitting, ask your doctor. Quitting will help your lungs heal faster.  Use an inhaler, cool mist vaporizer, or humidifier as told by your doctor.  Keep all follow-up visits as told by your doctor. This is important. How is this prevented? To lower your risk of getting this condition again:  Wash your hands often with soap and water. If you cannot use soap and water, use hand sanitizer.  Avoid contact with people who have cold symptoms.  Try not to touch your hands to your mouth, nose, or eyes.  Make sure to get the flu shot every year. Contact a doctor if:  Your symptoms do not get better in 2 weeks. Get help right away if:  You cough up blood.  You have chest pain.  You have very bad shortness of breath.  You become dehydrated.  You faint (pass out) or keep feeling like you are going to pass out.  You keep throwing up (vomiting).  You have a very bad headache.  Your fever or chills gets worse. This information is not intended to replace advice given to you by your health care provider. Make sure you discuss any questions you have with your health care provider. Document Released: 01/16/2008 Document  Revised: 03/13/2017 Document Reviewed: 01/18/2016 Elsevier Interactive Patient Education  2019 Elsevier Inc.  

## 2018-09-11 ENCOUNTER — Ambulatory Visit (HOSPITAL_COMMUNITY)
Admission: EM | Admit: 2018-09-11 | Discharge: 2018-09-11 | Disposition: A | Discharge status: Home / Self Care | Payer: Medicare Other | Attending: Family Medicine | Admitting: Family Medicine

## 2018-09-11 ENCOUNTER — Ambulatory Visit (INDEPENDENT_AMBULATORY_CARE_PROVIDER_SITE_OTHER): Payer: Medicare Other

## 2018-09-11 ENCOUNTER — Encounter (HOSPITAL_COMMUNITY): Payer: Self-pay | Admitting: Emergency Medicine

## 2018-09-11 DIAGNOSIS — R05 Cough: Secondary | ICD-10-CM

## 2018-09-11 DIAGNOSIS — R0602 Shortness of breath: Secondary | ICD-10-CM

## 2018-09-11 MED ORDER — HYDROCOD POLST-CPM POLST ER 10-8 MG/5ML PO SUER: 5.0000 mL | Freq: Two times a day (BID) | ORAL | 0 refills | Status: DC | PRN

## 2018-09-11 MED ORDER — PREDNISONE 20 MG PO TABS: ORAL_TABLET | ORAL | 0 refills | Status: DC

## 2018-09-11 NOTE — ED Triage Notes (Signed)
Pt c/o cough x1 month, states its causing rib pain, was given antibiotics took all of them but the cough never went away. States he coughs so much his chest hurts.

## 2018-09-11 NOTE — Discharge Instructions (Addendum)
Keep your appointment in three weeks at [facility] to discuss your breathing problem, the polyp on the uvula, and the swollen neck gland.  In the meantime, try to get an appointment with Dr. Constance Holster  Continue your fluid pill (furosemide)

## 2018-09-11 NOTE — ED Provider Notes (Signed)
[facility]    CSN: [id number] Arrival date & time: 09/11/18  1135     History   Chief Complaint Chief Complaint  Patient presents with  . Cough    HPI NATION CRADLE is a 63 y.o. male.   This is an initial visit for this 63 year old man with cough.  Pt c/o cough x1 month, states its causing rib pain, was given antibiotics took all of them but the cough never went away. States he coughs so much his chest hurts.  Patient smokes only marijuana.  He drinks on occasion up to a pint of liquor at a time.  [facility] but is not able to get an appointment until the latter part of February.  Patient has trouble sleeping because of his breathing.  He wakes up periodically at night choking.     Past Medical History:  Diagnosis Date  . Anginal pain ([place])   . Anxiety   . Arrhythmia   . CAD (coronary artery disease), native coronary artery    a. Nonobstructive CAD by cath 2013 - diffuse distal and branch vessel CAD, no severe disease in the major coronaries, LV mild global hypokinesis, EF 45%. b. ETT-Sestamibi 5/14: EF 31%, small fixed inferior defect with no ischemia.  . Cholecystitis   . Chronic CHF ([place])    a. Mixed ICM/NICM (?EtOH). EF 35% in 2008. Echo 5/13: EF 60-65%, mod LVH, EF 45% on V gram in 12/2011. EF 12/2012: EF 50-55%, mild LVH, inferobasal HK, mild MR. ETT-Ses 5/14 EF 41%. Cardiac MRI 5/14: EF 44%, mild global HK, subepicardial delayed enhancement in nonspecific RV insertion pattern.  . COLONIC POLYPS, HX OF 12/30/2006  . Gout   . H/O atrial flutter 2007   a. Ablations in 2007, 2008.  [name] Heart murmur   . HEPATITIS B, CHRONIC 12/30/2006  . History of alcohol abuse   . History of hiatal hernia   . History of medication noncompliance   . History of nuclear stress test    Myoview 3/17: + chest pain; EF 33%, downsloping ST depression 2, V4-6, LVH with strain, no ischemia on images; intermediate risk   .  HYPERCHOLESTEROLEMIA 07/11/2010  . Hypertension   . Left sciatic nerve pain since 04/2015  . LIVER FUNCTION TESTS, ABNORMAL, HX OF 12/30/2006  . MITRAL REGURGITATION 12/30/2006  . 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 PAF (paroxysmal atrial fibrillation) ([facility])    On coumadin  . Rectal bleeding 12/18/2011   Scheduled for colonoscopy.    . Shortness of breath dyspnea    with excertion  . Sleep apnea    "suppose to send mask but they never did" (05/03/2015)  . Type II diabetes mellitus [facility])     Patient Active Problem List   Diagnosis Date Noted  . Overweight (BMI 25.0-29.9) 02/28/2017  . Unstable angina ([place]) 08/03/2016  . S/P laparoscopic cholecystectomy 02/07/2016  . Coronary artery disease involving native coronary artery with unstable angina pectoris ([place]) 12/29/2015  . Diabetes ([place]) 11/12/2015  . Chronic anticoagulation 08/22/2015  . Atrial flutter with controlled response ([place]) 08/22/2015  . RUQ pain   . Cholelithiases- drain in place 06/04/2015  . Essential hypertension   . Chronic diastolic CHF (congestive heart failure) ([place]) 04/24/2015  . Excessive daytime sleepiness 11/25/2014  . OSA (obstructive sleep apnea) 11/25/2014  . NICM (nonischemic cardiomyopathy) ([place]) 03/11/2014  . Anticoagulation goal of INR 2 to 3 10/19/2013  . Eczema 10/19/2013  .  Low libido 10/16/2013  . Persistent atrial fibrillation 10/06/2013  . Rectal bleeding 12/18/2011  . HYPERCHOLESTEROLEMIA 07/11/2010  . DENTAL CARIES 06/30/2010  . GOUT 05/05/2009  . HEPATITIS B, CHRONIC 12/30/2006  . OSTEOCHONDROMA 12/30/2006  . Mitral valve disorder 12/30/2006  . LIVER FUNCTION TESTS, ABNORMAL, HX OF 12/30/2006  . COLONIC POLYPS, HX OF 12/30/2006  . COLONOSCOPY, HX OF 05/12/2001    Past Surgical History:  Procedure Laterality Date  . A FLUTTER ABLATION  2007, 2008   catheter ablation   . CARDIAC CATHETERIZATION N/A 05/03/2015   Procedure: Right/Left Heart Cath and Coronary Angiography;   Surgeon: Larey Dresser, MD;  Location: [place] [facility];  Service: Cardiovascular;  Laterality: N/A;  . CARDIAC CATHETERIZATION N/A 05/03/2015   Procedure: Coronary Stent Intervention;  Surgeon: Jettie Booze, MD;  Location: [place] [facility];  Service: Cardiovascular;  Laterality: N/A;  . CARDIAC CATHETERIZATION N/A 11/04/2015   Procedure: Left Heart Cath and Coronary Angiography;  Surgeon: Larey Dresser, MD;  Location: [place] [facility];  Service: Cardiovascular;  Laterality: N/A;  . CARDIOVERSION N/A 08/24/2015   Procedure: CARDIOVERSION;  Surgeon: Thayer Headings, MD;  Location: [facility] ENDOSCOPY;  Service: Cardiovascular;  Laterality: N/A;  . CHOLECYSTECTOMY N/A 02/07/2016   Procedure: LAPAROSCOPIC CHOLECYSTECTOMY WITH  INTRAOPERATIVE CHOLANGIOGRAM;  Surgeon: Greer Pickerel, MD;  Location: [facility];  Service: General;  Laterality: N/A;  . CORONARY ANGIOPLASTY  12/05/01  . LEFT HEART CATH AND CORONARY ANGIOGRAPHY N/A 12/28/2016   Procedure: Left Heart Cath and Coronary Angiography;  Surgeon: Larey Dresser, MD;  Location: [place] [facility];  Service: Cardiovascular;  Laterality: N/A;  . LEFT HEART CATHETERIZATION WITH CORONARY ANGIOGRAM N/A 12/19/2011   Procedure: LEFT HEART CATHETERIZATION WITH CORONARY ANGIOGRAM;  Surgeon: Larey Dresser, MD;  Location: [facility] CATH LAB;  Service: Cardiovascular;  Laterality: N/A;  . OSTEOCHONDROMA EXCISION Left 1972   "took bone tumor off my shoulder"       Home Medications    Prior to Admission medications   Medication Sig Start Date End Date Taking? Authorizing Provider  ACCU-CHEK SOFTCLIX LANCETS lancets Use as instructed for three times daily testing of blood glucose 09/06/16   Jegede, Olugbemiga E, MD  acetaminophen (TYLENOL) 500 MG tablet Take 1,000 mg by mouth every 6 (six) hours as needed for headache (pain). Reported on 08/18/2015    [provider]  amLODipine (NORVASC) 5 MG tablet Take 1 tablet (5 mg total) by mouth daily. 10/09/17    Larey Dresser, MD  atorvastatin (LIPITOR) 80 MG tablet Take 1 tablet (80 mg total) by mouth daily. 10/09/17   Larey Dresser, MD  Blood Glucose Monitoring Suppl (ACCU-CHEK AVIVA PLUS) w/Device KIT Use as directed for 3 times daily testing of blood glucose 09/06/16   Tresa Garter, MD  carvedilol (COREG) 25 MG tablet TAKE ONE TABLET BY MOUTH TWICE DAILY WITH MEALS 08/14/17   Larey Dresser, MD  chlorpheniramine-HYDROcodone [facility] PENNKINETIC ER) 10-8 MG/5ML SUER Take 5 mLs by mouth every 12 (twelve) hours as needed for cough. 09/11/18   Robyn Haber, MD  furosemide (LASIX) 20 MG tablet TAKE TWO TABLETS BY MOUTH IN THE MORNING, THEN TAKE ONE IN THE EVENING 10/09/17   Larey Dresser, MD  glucose blood (ACCU-CHEK AVIVA PLUS) test strip Use as instructed for 3 times daily testing of blood glucose 09/06/16   Jegede, Olugbemiga E, MD  hydrALAZINE (APRESOLINE) 50 MG tablet Take 1.5 tablets (75 mg total) by mouth 3 (  three) times daily. 06/23/18   Larey Dresser, MD  Insulin Glargine (LANTUS SOLOSTAR) 100 UNIT/ML Solostar Pen Inject 35 Units into the skin every morning. And pen needles 1/day 07/08/18   Charlott Rakes, MD  isosorbide mononitrate (IMDUR) 30 MG 24 hr tablet Take 1 tablet (30 mg total) by mouth daily. 06/23/18   Larey Dresser, MD  Lancet Devices [facility]) lancets Use as instructed for 3 times daily testing of blood glucose 09/06/16   Jegede, Olugbemiga E, MD  Melatonin 3 MG TABS Take 6 mg by mouth daily.    [provider]  metFORMIN (GLUCOPHAGE) 1000 MG tablet Take 1 tablet (1,000 mg total) by mouth 2 (two) times daily with a meal. 07/08/18   Charlott Rakes, MD  nitroGLYCERIN (NITROSTAT) 0.4 MG SL tablet Place 1 tablet (0.4 mg total) under the tongue every 5 (five) minutes as needed for chest pain (x 3 tabs daily). Reported on 08/18/2015 02/29/16   Larey Dresser, MD  predniSONE Donley Redder) 20 MG tablet Two daily with food 09/11/18   Robyn Haber, MD    rivaroxaban (XARELTO) 20 MG TABS tablet Take 1 tablet (20 mg total) by mouth daily with supper. 05/27/18   Larey Dresser, MD  sotalol (BETAPACE) 120 MG tablet TAKE ONE TABLET BY MOUTH EVERY 12 HOURS 01/01/18   Charlott Rakes, MD  TRUEPLUS LANCETS 28G MISC 1 each by Does not apply route 3 (three) times daily. 08/22/16   Tresa Garter, MD    Family History Family History  Problem Relation Age of Onset  . Diabetes Mother   . Hypertension Mother   . Heart attack Neg Hx   . Stroke Neg Hx     Social History Social History   Tobacco Use  . Smoking status: Former Smoker    Packs/day: 4.00    Years: 18.00    Pack years: 72.00    Types: Cigarettes    Last attempt to quit: 08/14/1983    Years since quitting: 35.1  . Smokeless tobacco: Never Used  Substance Use Topics  . Alcohol use: Yes    Alcohol/week: 16.0 standard drinks    Types: 6 Cans of beer, 10 Shots of liquor per week    Comment: occasionally  . Drug use: Yes    Types: Marijuana    Comment: occasionally     Allergies   Ace inhibitors and Other   Review of Systems Review of Systems  Constitutional: Negative for fever.  Respiratory: Positive for cough and shortness of breath.      Physical Exam Triage Vital Signs ED Triage Vitals  Enc Vitals Group     BP 09/11/18 1211 (!) 187/99     Pulse Rate 09/11/18 1211 76     Resp 09/11/18 1211 20     Temp 09/11/18 1211 97.7 F (36.5 C)     Temp Source 09/11/18 1211 Temporal     SpO2 09/11/18 1211 99 %     Weight --      Height --      Head Circumference --      Peak Flow --      Pain Score 09/11/18 1213 2     Pain Loc --      Pain Edu? --      Excl. in [place]? --    No data found.  Updated Vital Signs BP (!) 187/99   Pulse 76   Temp 97.7 F (36.5 C) (Temporal)   Resp 20   SpO2  99%    Physical Exam Vitals signs and nursing note reviewed.  Constitutional:      General: He is not in acute distress.    Appearance: He is obese. He is not  ill-appearing, toxic-appearing or diaphoretic.  HENT:     Head: Normocephalic.     Right Ear: Tympanic membrane and external ear normal.     Left Ear: Tympanic membrane and external ear normal.     Nose: Congestion present.     Mouth/Throat:     Mouth: Mucous membranes are moist.     Comments: 2 to 3 mm polyp on the uvula Eyes:     General: No scleral icterus.       Right eye: No discharge.        Left eye: No discharge.     Conjunctiva/sclera: Conjunctivae normal.     Pupils: Pupils are equal, round, and reactive to light.  Neck:     Musculoskeletal: Normal range of motion and neck supple.     Comments: Prominent left 1-1/2 cm cervical node below the angle of the jaw Cardiovascular:     Rate and Rhythm: Normal rate and regular rhythm.     Heart sounds: Normal heart sounds.  Pulmonary:     Effort: Pulmonary effort is normal.     Breath sounds: Rhonchi present.  Abdominal:     General: Bowel sounds are normal.     Comments: Right upper quadrant liver border is 4 cm below the costal margin  Musculoskeletal: Normal range of motion.  Lymphadenopathy:     Cervical: Cervical adenopathy present.  Skin:    General: Skin is warm and dry.  Neurological:     General: No focal deficit present.     Mental Status: He is alert.  Psychiatric:        Mood and Affect: Mood normal.      UC Treatments / Results  Labs (all labs ordered are listed, but only abnormal results are displayed) Labs Reviewed - No data to display  EKG None  Radiology Dg Chest 2 View  Result Date: 09/11/2018 CLINICAL DATA:  Cough for 1 month.  Shortness of breath. EXAM: CHEST - 2 VIEW COMPARISON:  01/13/2016 FINDINGS: Stable top-normal heart size/mild cardiac enlargement. There is increase in pulmonary vascularity since the prior study suggestive of mild pulmonary interstitial edema. No significant pleural effusions. No evidence of focal airspace consolidation, pulmonary nodule or pneumothorax. The bony thorax  is unremarkable. IMPRESSION: Stable heart size. There is increase in pulmonary vascularity since the prior studies suggestive of mild pulmonary interstitial edema. Electronically Signed   By: Aletta Edouard M.D.   On: 09/11/2018 13:12    Procedures Procedures (including critical care time)  Medications Ordered in UC Medications - No data to display  Initial Impression / Assessment and Plan / UC Course  I have reviewed the triage vital signs and the nursing notes.  Pertinent labs & imaging results that were available during my care of the patient were reviewed by me and considered in my medical decision making (see chart for details).    Final Clinical Impressions(s) / UC Diagnoses   Final diagnoses:  None     Discharge Instructions     Keep your appointment in three weeks at [facility] to discuss your breathing problem, the polyp on the uvula, and the swollen neck gland.  In the meantime, try to get an appointment with Dr. Constance Holster  Continue your fluid pill (furosemide)  ED Prescriptions    Medication Sig Dispense Auth. Provider   chlorpheniramine-HYDROcodone (TUSSIONEX PENNKINETIC ER) 10-8 MG/5ML SUER Take 5 mLs by mouth every 12 (twelve) hours as needed for cough. 140 mL Robyn Haber, MD   predniSONE (DELTASONE) 20 MG tablet Two daily with food 6 tablet Robyn Haber, MD     Controlled Substance Prescriptions [place] Controlled Substance Registry consulted? Not Applicable   Robyn Haber, MD 09/11/18 1334

## 2018-09-14 ENCOUNTER — Encounter: Payer: Self-pay | Admitting: Family Medicine

## 2018-09-23 ENCOUNTER — Other Ambulatory Visit: Payer: Self-pay | Admitting: Cardiology

## 2018-09-23 DIAGNOSIS — I2510 Atherosclerotic heart disease of native coronary artery without angina pectoris: Secondary | ICD-10-CM

## 2018-09-23 DIAGNOSIS — I480 Paroxysmal atrial fibrillation: Secondary | ICD-10-CM

## 2018-09-23 DIAGNOSIS — I5032 Chronic diastolic (congestive) heart failure: Secondary | ICD-10-CM

## 2018-09-29 ENCOUNTER — Encounter: Payer: Self-pay | Admitting: Family Medicine

## 2018-09-29 ENCOUNTER — Ambulatory Visit (HOSPITAL_BASED_OUTPATIENT_CLINIC_OR_DEPARTMENT_OTHER): Payer: Medicare Other | Admitting: Family Medicine

## 2018-09-29 ENCOUNTER — Ambulatory Visit (HOSPITAL_COMMUNITY)
Admission: RE | Admit: 2018-09-29 | Discharge: 2018-09-29 | Disposition: A | Discharge status: Home / Self Care | Payer: Medicare Other | Source: Ambulatory Visit | Attending: Family Medicine | Admitting: Family Medicine

## 2018-09-29 ENCOUNTER — Other Ambulatory Visit: Payer: Self-pay

## 2018-09-29 VITALS — BP 163/88 | HR 78 | Temp 98.4°F | Ht 72.0 in | Wt 207.2 lb

## 2018-09-29 DIAGNOSIS — Z794 Long term (current) use of insulin: Secondary | ICD-10-CM

## 2018-09-29 DIAGNOSIS — I480 Paroxysmal atrial fibrillation: Secondary | ICD-10-CM | POA: Insufficient documentation

## 2018-09-29 DIAGNOSIS — I5032 Chronic diastolic (congestive) heart failure: Secondary | ICD-10-CM | POA: Insufficient documentation

## 2018-09-29 DIAGNOSIS — E785 Hyperlipidemia, unspecified: Secondary | ICD-10-CM

## 2018-09-29 DIAGNOSIS — I10 Essential (primary) hypertension: Secondary | ICD-10-CM

## 2018-09-29 DIAGNOSIS — R059 Cough, unspecified: Secondary | ICD-10-CM

## 2018-09-29 DIAGNOSIS — E1165 Type 2 diabetes mellitus with hyperglycemia: Secondary | ICD-10-CM

## 2018-09-29 DIAGNOSIS — R05 Cough: Secondary | ICD-10-CM

## 2018-09-29 DIAGNOSIS — R079 Chest pain, unspecified: Secondary | ICD-10-CM | POA: Insufficient documentation

## 2018-09-29 DIAGNOSIS — E1169 Type 2 diabetes mellitus with other specified complication: Secondary | ICD-10-CM

## 2018-09-29 DIAGNOSIS — Z91148 Patient's other noncompliance with medication regimen for other reason: Secondary | ICD-10-CM

## 2018-09-29 DIAGNOSIS — I110 Hypertensive heart disease with heart failure: Secondary | ICD-10-CM | POA: Insufficient documentation

## 2018-09-29 DIAGNOSIS — I2510 Atherosclerotic heart disease of native coronary artery without angina pectoris: Secondary | ICD-10-CM | POA: Diagnosis not present

## 2018-09-29 DIAGNOSIS — Z9114 Patient's other noncompliance with medication regimen: Secondary | ICD-10-CM

## 2018-09-29 DIAGNOSIS — E0800 Diabetes mellitus due to underlying condition with hyperosmolarity without nonketotic hyperglycemic-hyperosmolar coma (NKHHC): Secondary | ICD-10-CM

## 2018-09-29 LAB — POCT GLYCOSYLATED HEMOGLOBIN (HGB A1C): HBA1C, POC (CONTROLLED DIABETIC RANGE): 9.2 % — AB (ref 0.0–7.0)

## 2018-09-29 LAB — GLUCOSE, POCT (MANUAL RESULT ENTRY)
POC GLUCOSE: 457 mg/dL — AB (ref 70–99)
POC Glucose: 400 mg/dl — AB (ref 70–99)

## 2018-09-29 MED ORDER — CARVEDILOL 25 MG PO TABS: 25.0000 mg | ORAL_TABLET | Freq: Two times a day (BID) | ORAL | 1 refills | Status: DC

## 2018-09-29 MED ORDER — NITROGLYCERIN 0.4 MG SL SUBL: 0.4000 mg | SUBLINGUAL_TABLET | SUBLINGUAL | 1 refills | Status: DC | PRN

## 2018-09-29 MED ORDER — INSULIN ASPART 100 UNIT/ML ~~LOC~~ SOLN
20.0000 [IU] | Freq: Once | SUBCUTANEOUS | Status: AC
  Administered 2018-09-29: 20 [IU] via SUBCUTANEOUS

## 2018-09-29 MED ORDER — METFORMIN HCL 1000 MG PO TABS: 1000.0000 mg | ORAL_TABLET | Freq: Two times a day (BID) | ORAL | 1 refills | Status: DC

## 2018-09-29 MED ORDER — AMLODIPINE BESYLATE 5 MG PO TABS: 5.0000 mg | ORAL_TABLET | Freq: Every day | ORAL | 1 refills | Status: DC

## 2018-09-29 MED ORDER — FUROSEMIDE 20 MG PO TABS: ORAL_TABLET | ORAL | 6 refills | Status: DC

## 2018-09-29 MED ORDER — ATORVASTATIN CALCIUM 80 MG PO TABS: 80.0000 mg | ORAL_TABLET | Freq: Every day | ORAL | 1 refills | Status: DC

## 2018-09-29 MED ORDER — TETANUS-DIPHTH-ACELL PERTUSSIS 5-2.5-18.5 LF-MCG/0.5 IM SUSP: 0.5000 mL | Freq: Once | INTRAMUSCULAR | 0 refills | Status: AC

## 2018-09-29 MED FILL — BOOSTRIX VACCINE SYRINGE: 5-2.5-18.5 | 1 days supply | Qty: 1 | Fill #0

## 2018-09-29 NOTE — Progress Notes (Signed)
Subjective:  Patient ID: [name] [name], male    DOB: 1956-01-07  Age: 63 y.o. MRN: [id number]  CC: Diabetes   HPI [name] [name]  is a 63 year old male with a history of CHF (EF 10 to 60% from echo of 01/2016), atrial fibrillation, CAD (status post DES x2) diabetes mellitus (A1c 9.2), hypertension who presents today for follow-up visit accompanied by his daughter. His blood pressure is elevated and he endorses not taking his antihypertensive today and his blood sugar is 457 and he has not been compliant with his Lantus or metformin; last meal was 1.5 hours ago.  Denies abdominal pain, blurry vision, numbness in extremities. He complains of a chronic cough which he has had for the last 1.5 to 2 months and feels like he has phlegm in his throat.  He was treated for bronchitis last month and seen at the ED where he received Tussionex and prednisone which have not brought about resolution of his symptoms.  He denies sinus pressure, sinus pain, fever and endorses intermittent dyspnea and wheezing.  Also complains of having to cough whenever he speaks and also has right sided chest pain when he coughs. His daughter is wondering if he has a component of COPD.  He quit smoking about 29 years ago and prior to that had smoked from when he was in the ninth grade. Compliance with Lasix cannot be emphasized is he states he sometimes takes his medications and other times does not.  Denies pedal edema or weight gain and last visit to cardiology was in 05/2018. Cardiac cath from 12/2016 revealed diffuse coronary disease, no good target for intervention and aggressive medical management recommended.  Past Medical History:  Diagnosis Date  . Anginal pain ([place])   . Anxiety   . Arrhythmia   . CAD (coronary artery disease), native coronary artery    a. Nonobstructive CAD by cath 2013 - diffuse distal and branch vessel CAD, no severe disease in the major coronaries, LV mild global hypokinesis, EF 45%. b.  ETT-Sestamibi 5/14: EF 31%, small fixed inferior defect with no ischemia.  . Cholecystitis   . Chronic CHF ([place])    a. Mixed ICM/NICM (?EtOH). EF 35% in 2008. Echo 5/13: EF 60-65%, mod LVH, EF 45% on V gram in 12/2011. EF 12/2012: EF 50-55%, mild LVH, inferobasal HK, mild MR. ETT-Ses 5/14 EF 41%. Cardiac MRI 5/14: EF 44%, mild global HK, subepicardial delayed enhancement in nonspecific RV insertion pattern.  . COLONIC POLYPS, HX OF 12/30/2006  . Gout   . H/O atrial flutter 2007   a. Ablations in 2007, 2008.  [name] Heart murmur   . HEPATITIS B, CHRONIC 12/30/2006  . History of alcohol abuse   . History of hiatal hernia   . History of medication noncompliance   . History of nuclear stress test    Myoview 3/17: + chest pain; EF 33%, downsloping ST depression 2, V4-6, LVH with strain, no ischemia on images; intermediate risk   . HYPERCHOLESTEROLEMIA 07/11/2010  . Hypertension   . Left sciatic nerve pain since 04/2015  . LIVER FUNCTION TESTS, ABNORMAL, HX OF 12/30/2006  . MITRAL REGURGITATION 12/30/2006  . 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 PAF (paroxysmal atrial fibrillation) (HCC)    On coumadin  . Rectal bleeding 12/18/2011   Scheduled for colonoscopy.    . Shortness of breath dyspnea    with excertion  . Sleep apnea    "suppose to send mask but they never did" (05/03/2015)  .  Type II diabetes mellitus ([place])     Past Surgical History:  Procedure Laterality Date  . A FLUTTER ABLATION  2007, 2008   catheter ablation   . CARDIAC CATHETERIZATION N/A 05/03/2015   Procedure: Right/Left Heart Cath and Coronary Angiography;  Surgeon: Larey Dresser, MD;  Location: [facility];  Service: Cardiovascular;  Laterality: N/A;  . CARDIAC CATHETERIZATION N/A 05/03/2015   Procedure: Coronary Stent Intervention;  Surgeon: Jettie Booze, MD;  Location: [facility];  Service: Cardiovascular;  Laterality: N/A;  . CARDIAC CATHETERIZATION N/A 11/04/2015   Procedure: Left Heart  Cath and Coronary Angiography;  Surgeon: Larey Dresser, MD;  Location: [facility];  Service: Cardiovascular;  Laterality: N/A;  . CARDIOVERSION N/A 08/24/2015   Procedure: CARDIOVERSION;  Surgeon: Thayer Headings, MD;  Location: [facility] ENDOSCOPY;  Service: Cardiovascular;  Laterality: N/A;  . CHOLECYSTECTOMY N/A 02/07/2016   Procedure: LAPAROSCOPIC CHOLECYSTECTOMY WITH  INTRAOPERATIVE CHOLANGIOGRAM;  Surgeon: Greer Pickerel, MD;  Location: [facility];  Service: General;  Laterality: N/A;  . CORONARY ANGIOPLASTY  12/05/01  . LEFT HEART CATH AND CORONARY ANGIOGRAPHY N/A 12/28/2016   Procedure: Left Heart Cath and Coronary Angiography;  Surgeon: Larey Dresser, MD;  Location: [place] [facility];  Service: Cardiovascular;  Laterality: N/A;  . LEFT HEART CATHETERIZATION WITH CORONARY ANGIOGRAM N/A 12/19/2011   Procedure: LEFT HEART CATHETERIZATION WITH CORONARY ANGIOGRAM;  Surgeon: Larey Dresser, MD;  Location: [facility] CATH LAB;  Service: Cardiovascular;  Laterality: N/A;  . OSTEOCHONDROMA EXCISION Left 1972   "took bone tumor off my shoulder"    Family History  Problem Relation Age of Onset  . Diabetes Mother   . Hypertension Mother   . Heart attack Neg Hx   . Stroke Neg Hx     Allergies  Allergen Reactions  . Ace Inhibitors Cough  . Other Hives and Other (See Comments)    Patient reports developing hives after receiving "some antibiotic given in 1980''s at [facility]". He does not know which antibiotic.    Outpatient Medications Prior to Visit  Medication Sig Dispense Refill  . ACCU-CHEK SOFTCLIX LANCETS lancets Use as instructed for three times daily testing of blood glucose 100 each 12  . acetaminophen (TYLENOL) 500 MG tablet Take 1,000 mg by mouth every 6 (six) hours as needed for headache (pain). Reported on 08/18/2015    . Blood Glucose Monitoring Suppl (ACCU-CHEK AVIVA PLUS) w/Device KIT Use as directed for 3 times daily testing of blood glucose 1 kit 0  .  chlorpheniramine-HYDROcodone (TUSSIONEX PENNKINETIC ER) 10-8 MG/5ML SUER Take 5 mLs by mouth every 12 (twelve) hours as needed for cough. 140 mL 0  . glucose blood (ACCU-CHEK AVIVA PLUS) test strip Use as instructed for 3 times daily testing of blood glucose 100 each 12  . hydrALAZINE (APRESOLINE) 50 MG tablet Take 1.5 tablets (75 mg total) by mouth 3 (three) times daily. 135 tablet 5  . Insulin Glargine (LANTUS SOLOSTAR) 100 UNIT/ML Solostar Pen Inject 35 Units into the skin every morning. And pen needles 1/day 30 mL 6  . isosorbide mononitrate (IMDUR) 30 MG 24 hr tablet Take 1 tablet (30 mg total) by mouth daily. 30 tablet 6  . Lancet Devices (ACCU-CHEK SOFTCLIX) lancets Use as instructed for 3 times daily testing of blood glucose 1 each 0  . Melatonin 3 MG TABS Take 6 mg by mouth daily.    . predniSONE (DELTASONE) 20 MG tablet Two daily with food 6  tablet 0  . rivaroxaban (XARELTO) 20 MG TABS tablet Take 1 tablet (20 mg total) by mouth daily with supper. 30 tablet 6  . sotalol (BETAPACE) 120 MG tablet TAKE ONE TABLET BY MOUTH EVERY 12 HOURS 180 tablet 2  . TRUEPLUS LANCETS 28G MISC 1 each by Does not apply route 3 (three) times daily. 100 each 12  . amLODipine (NORVASC) 5 MG tablet Take 1 tablet (5 mg total) by mouth daily. 90 tablet 3  . atorvastatin (LIPITOR) 80 MG tablet Take 1 tablet (80 mg total) by mouth daily. 90 tablet 3  . carvedilol (COREG) 25 MG tablet TAKE 1 TABLET BY MOUTH TWICE DAILY WITH MEALS 60 tablet 0  . furosemide (LASIX) 20 MG tablet TAKE TWO TABLETS BY MOUTH IN THE MORNING, THEN TAKE ONE IN THE EVENING 90 tablet 6  . metFORMIN (GLUCOPHAGE) 1000 MG tablet Take 1 tablet (1,000 mg total) by mouth 2 (two) times daily with a meal. 180 tablet 1  . nitroGLYCERIN (NITROSTAT) 0.4 MG SL tablet Place 1 tablet (0.4 mg total) under the tongue every 5 (five) minutes as needed for chest pain (x 3 tabs daily). Reported on 08/18/2015 100 tablet 1   No facility-administered medications prior  to visit.      ROS Review of Systems General: negative for fever, weight loss, appetite change Eyes: no visual symptoms. ENT: no ear symptoms, no sinus tenderness, no nasal congestion or sore throat. Neck: no pain  Respiratory: no wheezing, shortness of breath, +cough Cardiovascular: no chest pain, no dyspnea on exertion, no pedal edema, no orthopnea. Gastrointestinal: no abdominal pain, no diarrhea, no constipation Genito-Urinary: no urinary frequency, no dysuria, no polyuria. Hematologic: no bruising Endocrine: no cold or heat intolerance Neurological: no headaches, no seizures, no tremors Musculoskeletal: no joint pains, no joint swelling Skin: no pruritus, no rash. Psychological: no depression, no anxiety,    Objective:  BP (!) 163/88   Pulse 78   Temp 98.4 F (36.9 C) (Oral)   Ht 6' (1.829 m)   Wt 207 lb 3.2 oz (94 kg)   SpO2 98%   BMI 28.10 kg/m   BP/Weight 09/29/2018 01/17/3015 0/08/930  Systolic BP 355 732 202  Diastolic BP 88 99 68  Wt. (Lbs) 207.2 - 211  BMI 28.1 - 28.62    Wt Readings from Last 3 Encounters:  09/29/18 207 lb 3.2 oz (94 kg)  08/15/18 211 lb (95.7 kg)  07/08/18 211 lb 12.8 oz (96.1 kg)     Physical Exam Constitutional: normal appearing,  Eyes: PERRLA HEENT: Head is atraumatic, normal sinuses, normal oropharynx, normal appearing tonsils and palate, tympanic membrane is normal bilaterally. Neck: normal range of motion, no thyromegaly, +JVD Cardiovascular: normal rate and rhythm, normal heart sounds, no murmurs, rub or gallop, no pedal edema Respiratory: Normal breath sounds, clear to auscultation bilaterally, no wheezes, no rales, no rhonchi Abdomen: soft, not tender to palpation, normal bowel sounds, no enlarged organs Musculoskeletal: Full ROM, no tenderness in joints Skin: warm and dry, no lesions. Neurological: alert, oriented x3, cranial nerves I-XII grossly intact , normal motor strength, normal sensation. Psychological: normal  mood.   CMP Latest Ref Rng & Units 05/27/2018 04/08/2018 10/09/2017  Glucose 70 - 99 mg/dL 241(H) 147(H) 204(H)  BUN 8 - 23 mg/dL _0 Creatinine 0.61 - 1.24 mg/dL 1.16 1.13 1.25(H)  Sodium 135 - 145 mmol/L 138 140 139  Potassium 3.5 - 5.1 mmol/L 4.1 3.6 3.9  Chloride 98 - 111 mmol/L 106 104  104  CO2 22 - 32 mmol/L _0 Calcium 8.9 - 10.3 mg/dL 9.2 9.1 9.3  Total Protein 6.0 - 8.5 g/dL - 7.1 -  Total Bilirubin 0.0 - 1.2 mg/dL - 0.4 -  Alkaline Phos 39 - 117 IU/L - 65 -  AST 0 - 40 IU/L - 15 -  ALT 0 - 44 IU/L - 12 -    Lipid Panel     Component Value Date/Time   CHOL 139 04/08/2018 1102   TRIG 112 04/08/2018 1102   HDL 49 04/08/2018 1102   CHOLHDL 2.8 04/08/2018 1102   CHOLHDL 2.2 03/26/2017 1132   VLDL 13 03/26/2017 1132   LDLCALC 68 04/08/2018 1102   LDLDIRECT 111.0 04/22/2015 1559    CBC    Component Value Date/Time   WBC 5.3 05/27/2018 1151   RBC 5.48 05/27/2018 1151   HGB 14.4 05/27/2018 1151   HGB 14.0 07/29/2017 1101   HCT 46.3 05/27/2018 1151   HCT 44.2 07/29/2017 1101   PLT 191 05/27/2018 1151   PLT 187 07/29/2017 1101   MCV 84.5 05/27/2018 1151   MCV 83 07/29/2017 1101   MCH 26.3 05/27/2018 1151   MCHC 31.1 05/27/2018 1151   RDW 15.2 05/27/2018 1151   RDW 16.8 (H) 07/29/2017 1101   LYMPHSABS 2.2 07/29/2017 1101   MONOABS 690 08/03/2016 1105   EOSABS 0.4 07/29/2017 1101   BASOSABS 0.1 07/29/2017 1101    Lab Results  Component Value Date   HGBA1C 9.2 (A) 09/29/2018    Assessment & Plan:   1. Coronary artery disease involving native coronary artery of native heart without angina pectoris He complains of some chest pain which occurs when he coughs EKG today reveals LVH with repolarization abnormality which is not new compared to previous EKG Could be musculoskeletal Advised to keep follow-up appointment with cardiology - nitroGLYCERIN (NITROSTAT) 0.4 MG SL tablet; Place 1 tablet (0.4 mg total) under the tongue every 5 (five) minutes as  needed for chest pain (x 3 tabs daily).  Dispense: 60 tablet; Refill: 1  2. Type 2 diabetes mellitus with other specified complication, with long-term current use of insulin (HCC) Uncontrolled due to noncompliance A1c of 9.2 which is up from 7.6 previously NovoLog 20 units administered in the clinic and blood sugar repeated and 30 minutes No regimen change but he will see the clinical pharmacist in 2 weeks with his blood sugar log and his insulin regimen will be adjusted accordingly - POCT glucose (manual entry) - POCT glycosylated hemoglobin (Hb A1C) - metFORMIN (GLUCOPHAGE) 1000 MG tablet; Take 1 tablet (1,000 mg total) by mouth 2 (two) times daily with a meal.  Dispense: 180 tablet; Refill: 1 - POCT glucose (manual entry) - insulin aspart (novoLOG) injection 20 Units  3. Dyslipidemia Low-cholesterol diet - atorvastatin (LIPITOR) 80 MG tablet; Take 1 tablet (80 mg total) by mouth daily.  Dispense: 90 tablet; Refill: 1  4. Essential hypertension Uncontrolled-yet to take his antihypertensive - amLODipine (NORVASC) 5 MG tablet; Take 1 tablet (5 mg total) by mouth daily.  Dispense: 90 tablet; Refill: 1  5. Chronic diastolic heart failure (HCC) EF of 55 to 60% previously Noncompliant with Lasix with slight pulmonary edema evident  CXR last month - ECHOCARDIOGRAM COMPLETE; Future - carvedilol (COREG) 25 MG tablet; Take 1 tablet (25 mg total) by mouth 2 (two) times daily with a meal.  Dispense: 180 tablet; Refill: 1 - furosemide (LASIX) 20 MG tablet; TAKE TWO TABLETS BY MOUTH IN THE  MORNING, THEN TAKE ONE IN THE EVENING  Dispense: 90 tablet; Refill: 6 - Pulmonary function test; Future - Brain natriuretic peptide  6. Atherosclerosis of native coronary artery of native heart without angina pectoris See #1 above Risk factor modifications especially control of hypertension diabetes - carvedilol (COREG) 25 MG tablet; Take 1 tablet (25 mg total) by mouth 2 (two) times daily with a meal.   Dispense: 180 tablet; Refill: 1  7. Paroxysmal atrial fibrillation (HCC) Continue Xarelto - carvedilol (COREG) 25 MG tablet; Take 1 tablet (25 mg total) by mouth 2 (two) times daily with a meal.  Dispense: 180 tablet; Refill: 1  8. Non compliance w medication regimen Discussed implications of noncompliance and he promises to do better  9. Cough Treated for bronchitis a month ago  We need to exclude cardiac etiology with echo, BNP and will also order PFT given her remote history of smoking and to exclude COPD   Meds ordered this encounter  Medications  . Tdap (BOOSTRIX) 5-2.5-18.5 LF-MCG/0.5 injection    Sig: Inject 0.5 mLs into the muscle once for 1 dose.    Dispense:  0.5 mL    Refill:  0  . nitroGLYCERIN (NITROSTAT) 0.4 MG SL tablet    Sig: Place 1 tablet (0.4 mg total) under the tongue every 5 (five) minutes as needed for chest pain (x 3 tabs daily).    Dispense:  60 tablet    Refill:  1  . metFORMIN (GLUCOPHAGE) 1000 MG tablet    Sig: Take 1 tablet (1,000 mg total) by mouth 2 (two) times daily with a meal.    Dispense:  180 tablet    Refill:  1  . atorvastatin (LIPITOR) 80 MG tablet    Sig: Take 1 tablet (80 mg total) by mouth daily.    Dispense:  90 tablet    Refill:  1  . amLODipine (NORVASC) 5 MG tablet    Sig: Take 1 tablet (5 mg total) by mouth daily.    Dispense:  90 tablet    Refill:  1  . carvedilol (COREG) 25 MG tablet    Sig: Take 1 tablet (25 mg total) by mouth 2 (two) times daily with a meal.    Dispense:  180 tablet    Refill:  1  . furosemide (LASIX) 20 MG tablet    Sig: TAKE TWO TABLETS BY MOUTH IN THE MORNING, THEN TAKE ONE IN THE EVENING    Dispense:  90 tablet    Refill:  6    Please consider 90 day supplies to promote better adherence  . insulin aspart (novoLOG) injection 20 Units    Follow-up: Return in about 2 weeks (around 10/13/2018) for [facility], follow-up of blood sugars; 3 months PCP.       Charlott Rakes, MD, FAAFP. [facility] and [facility], [place]   09/29/2018, 12:54 PM

## 2018-09-29 NOTE — Progress Notes (Signed)
Patient states that he coughs sometimes when he talks and has pain in chest.

## 2018-09-29 NOTE — Patient Instructions (Signed)
Diabetes Mellitus and Nutrition, Adult  When you have diabetes (diabetes mellitus), it is very important to have healthy eating habits because your blood sugar (glucose) levels are greatly affected by what you eat and drink. Eating healthy foods in the appropriate amounts, at about the same times every day, can help you:  · Control your blood glucose.  · Lower your risk of heart disease.  · Improve your blood pressure.  · Reach or maintain a healthy weight.  Every person with diabetes is different, and each person has different needs for a meal plan. Your health care provider may recommend that you work with a diet and nutrition specialist (dietitian) to make a meal plan that is best for you. Your meal plan may vary depending on factors such as:  · The calories you need.  · The medicines you take.  · Your weight.  · Your blood glucose, blood pressure, and cholesterol levels.  · Your activity level.  · Other health conditions you have, such as heart or kidney disease.  How do carbohydrates affect me?  Carbohydrates, also called carbs, affect your blood glucose level more than any other type of food. Eating carbs naturally raises the amount of glucose in your blood. Carb counting is a method for keeping track of how many carbs you eat. Counting carbs is important to keep your blood glucose at a healthy level, especially if you use insulin or take certain oral diabetes medicines.  It is important to know how many carbs you can safely have in each meal. This is different for every person. Your dietitian can help you calculate how many carbs you should have at each meal and for each snack.  Foods that contain carbs include:  · Bread, cereal, rice, pasta, and crackers.  · Potatoes and corn.  · Peas, beans, and lentils.  · Milk and yogurt.  · Fruit and juice.  · Desserts, such as cakes, cookies, ice cream, and candy.  How does alcohol affect me?  Alcohol can cause a sudden decrease in blood glucose (hypoglycemia),  especially if you use insulin or take certain oral diabetes medicines. Hypoglycemia can be a life-threatening condition. Symptoms of hypoglycemia (sleepiness, dizziness, and confusion) are similar to symptoms of having too much alcohol.  If your health care provider says that alcohol is safe for you, follow these guidelines:  · Limit alcohol intake to no more than 1 drink per day for nonpregnant women and 2 drinks per day for men. One drink equals 12 oz of beer, 5 oz of wine, or 1½ oz of hard liquor.  · Do not drink on an empty stomach.  · Keep yourself hydrated with water, diet soda, or unsweetened iced tea.  · Keep in mind that regular soda, juice, and other mixers may contain a lot of sugar and must be counted as carbs.  What are tips for following this plan?    Reading food labels  · Start by checking the serving size on the "Nutrition Facts" label of packaged foods and drinks. The amount of calories, carbs, fats, and other nutrients listed on the label is based on one serving of the item. Many items contain more than one serving per package.  · Check the total grams (g) of carbs in one serving. You can calculate the number of servings of carbs in one serving by dividing the total carbs by 15. For example, if a food has 30 g of total carbs, it would be equal to 2   servings of carbs.  · Check the number of grams (g) of saturated and trans fats in one serving. Choose foods that have low or no amount of these fats.  · Check the number of milligrams (mg) of salt (sodium) in one serving. Most people should limit total sodium intake to less than 2,300 mg per day.  · Always check the nutrition information of foods labeled as "low-fat" or "nonfat". These foods may be higher in added sugar or refined carbs and should be avoided.  · Talk to your dietitian to identify your daily goals for nutrients listed on the label.  Shopping  · Avoid buying canned, premade, or processed foods. These foods tend to be high in fat, sodium,  and added sugar.  · Shop around the outside edge of the grocery store. This includes fresh fruits and vegetables, bulk grains, fresh meats, and fresh dairy.  Cooking  · Use low-heat cooking methods, such as baking, instead of high-heat cooking methods like deep frying.  · Cook using healthy oils, such as olive, canola, or sunflower oil.  · Avoid cooking with butter, cream, or high-fat meats.  Meal planning  · Eat meals and snacks regularly, preferably at the same times every day. Avoid going long periods of time without eating.  · Eat foods high in fiber, such as fresh fruits, vegetables, beans, and whole grains. Talk to your dietitian about how many servings of carbs you can eat at each meal.  · Eat 4-6 ounces (oz) of lean protein each day, such as lean meat, chicken, fish, eggs, or tofu. One oz of lean protein is equal to:  ? 1 oz of meat, chicken, or fish.  ? 1 egg.  ? ¼ cup of tofu.  · Eat some foods each day that contain healthy fats, such as avocado, nuts, seeds, and fish.  Lifestyle  · Check your blood glucose regularly.  · Exercise regularly as told by your health care provider. This may include:  ? 150 minutes of moderate-intensity or vigorous-intensity exercise each week. This could be brisk walking, biking, or water aerobics.  ? Stretching and doing strength exercises, such as yoga or weightlifting, at least 2 times a week.  · Take medicines as told by your health care provider.  · Do not use any products that contain nicotine or tobacco, such as cigarettes and e-cigarettes. If you need help quitting, ask your health care provider.  · Work with a counselor or diabetes educator to identify strategies to manage stress and any emotional and social challenges.  Questions to ask a health care provider  · Do I need to meet with a diabetes educator?  · Do I need to meet with a dietitian?  · What number can I call if I have questions?  · When are the best times to check my blood glucose?  Where to find more  information:  · American Diabetes Association: diabetes.org  · Academy of Nutrition and Dietetics: www.eatright.org  · National Institute of Diabetes and Digestive and Kidney Diseases (NIH): www.niddk.nih.gov  Summary  · A healthy meal plan will help you control your blood glucose and maintain a healthy lifestyle.  · Working with a diet and nutrition specialist (dietitian) can help you make a meal plan that is best for you.  · Keep in mind that carbohydrates (carbs) and alcohol have immediate effects on your blood glucose levels. It is important to count carbs and to use alcohol carefully.  This information is not intended to   replace advice given to you by your health care provider. Make sure you discuss any questions you have with your health care provider.  Document Released: 04/26/2005 Document Revised: 02/27/2017 Document Reviewed: 09/03/2016  Elsevier Interactive Patient Education © 2019 [employer]

## 2018-09-30 LAB — BRAIN NATRIURETIC PEPTIDE: BNP: 86.6 pg/mL (ref 0.0–100.0)

## 2018-10-03 ENCOUNTER — Ambulatory Visit (HOSPITAL_COMMUNITY): Admission: RE | Admit: 2018-10-03 | Payer: Medicare Other | Source: Ambulatory Visit

## 2018-10-08 ENCOUNTER — Inpatient Hospital Stay (HOSPITAL_COMMUNITY): Admission: RE | Admit: 2018-10-08 | Payer: Medicare Other | Source: Ambulatory Visit

## 2018-10-14 ENCOUNTER — Telehealth: Payer: Self-pay

## 2018-10-14 NOTE — Telephone Encounter (Signed)
-----   Message from Charlott Rakes, MD sent at 10/01/2018  8:57 AM EST ----- Please inform the patient that labs are normal. Thank you.

## 2018-10-14 NOTE — Telephone Encounter (Signed)
Patient was called and informed of lab results. 

## 2018-10-19 ENCOUNTER — Other Ambulatory Visit: Payer: Self-pay

## 2018-10-19 ENCOUNTER — Encounter (HOSPITAL_COMMUNITY): Payer: Self-pay | Admitting: Emergency Medicine

## 2018-10-19 ENCOUNTER — Emergency Department (HOSPITAL_COMMUNITY): Payer: Medicare Other

## 2018-10-19 ENCOUNTER — Emergency Department (HOSPITAL_COMMUNITY)
Admission: EM | Admit: 2018-10-19 | Discharge: 2018-10-19 | Disposition: A | Discharge status: Home / Self Care | Payer: Medicare Other | Attending: Emergency Medicine | Admitting: Emergency Medicine

## 2018-10-19 DIAGNOSIS — E7800 Pure hypercholesterolemia, unspecified: Secondary | ICD-10-CM | POA: Insufficient documentation

## 2018-10-19 DIAGNOSIS — Z79899 Other long term (current) drug therapy: Secondary | ICD-10-CM | POA: Diagnosis not present

## 2018-10-19 DIAGNOSIS — I110 Hypertensive heart disease with heart failure: Secondary | ICD-10-CM | POA: Diagnosis not present

## 2018-10-19 DIAGNOSIS — Z87891 Personal history of nicotine dependence: Secondary | ICD-10-CM | POA: Diagnosis not present

## 2018-10-19 DIAGNOSIS — I2510 Atherosclerotic heart disease of native coronary artery without angina pectoris: Secondary | ICD-10-CM | POA: Diagnosis not present

## 2018-10-19 DIAGNOSIS — R0789 Other chest pain: Secondary | ICD-10-CM | POA: Insufficient documentation

## 2018-10-19 DIAGNOSIS — R079 Chest pain, unspecified: Secondary | ICD-10-CM | POA: Diagnosis present

## 2018-10-19 DIAGNOSIS — I480 Paroxysmal atrial fibrillation: Secondary | ICD-10-CM | POA: Diagnosis not present

## 2018-10-19 DIAGNOSIS — Z794 Long term (current) use of insulin: Secondary | ICD-10-CM | POA: Diagnosis not present

## 2018-10-19 DIAGNOSIS — I5032 Chronic diastolic (congestive) heart failure: Secondary | ICD-10-CM | POA: Diagnosis not present

## 2018-10-19 DIAGNOSIS — E119 Type 2 diabetes mellitus without complications: Secondary | ICD-10-CM | POA: Insufficient documentation

## 2018-10-19 DIAGNOSIS — Z7901 Long term (current) use of anticoagulants: Secondary | ICD-10-CM | POA: Insufficient documentation

## 2018-10-19 LAB — CBC WITH DIFFERENTIAL/PLATELET
Abs Immature Granulocytes: 0.02 10*3/uL (ref 0.00–0.07)
Basophils Absolute: 0.1 10*3/uL (ref 0.0–0.1)
Basophils Relative: 1 %
Eosinophils Absolute: 0.2 10*3/uL (ref 0.0–0.5)
Eosinophils Relative: 3 %
HEMATOCRIT: 43.4 % (ref 39.0–52.0)
Hemoglobin: 13.7 g/dL (ref 13.0–17.0)
Immature Granulocytes: 0 %
LYMPHS ABS: 2 10*3/uL (ref 0.7–4.0)
Lymphocytes Relative: 33 %
MCH: 25.8 pg — ABNORMAL LOW (ref 26.0–34.0)
MCHC: 31.6 g/dL (ref 30.0–36.0)
MCV: 81.7 fL (ref 80.0–100.0)
Monocytes Absolute: 0.6 10*3/uL (ref 0.1–1.0)
Monocytes Relative: 10 %
Neutro Abs: 3.2 10*3/uL (ref 1.7–7.7)
Neutrophils Relative %: 53 %
Platelets: 178 10*3/uL (ref 150–400)
RBC: 5.31 MIL/uL (ref 4.22–5.81)
RDW: 15.1 % (ref 11.5–15.5)
WBC: 6.1 10*3/uL (ref 4.0–10.5)
nRBC: 0 % (ref 0.0–0.2)

## 2018-10-19 LAB — BASIC METABOLIC PANEL
Anion gap: 7 (ref 5–15)
BUN: 13 mg/dL (ref 8–23)
CO2: 24 mmol/L (ref 22–32)
Calcium: 9.4 mg/dL (ref 8.9–10.3)
Chloride: 107 mmol/L (ref 98–111)
Creatinine, Ser: 1.23 mg/dL (ref 0.61–1.24)
GFR calc Af Amer: >60 mL/min (ref >60)
GFR calc non Af Amer: >60 mL/min (ref >60)
Glucose, Bld: 232 mg/dL — ABNORMAL HIGH (ref 70–99)
Potassium: 3.9 mmol/L (ref 3.5–5.1)
Sodium: 138 mmol/L (ref 135–145)

## 2018-10-19 LAB — I-STAT TROPONIN, ED
Troponin i, poc: 0.01 ng/mL (ref 0.00–0.08)
Troponin i, poc: 0.02 ng/mL (ref 0.00–0.08)

## 2018-10-19 NOTE — Discharge Instructions (Signed)
Take tylenol 1000mg(2 extra strength) four times a day.  ° °

## 2018-10-19 NOTE — ED Provider Notes (Signed)
[facility] EMERGENCY DEPARTMENT Provider Note   CSN: [id number] Arrival date & time: 10/19/18  1545    History   Chief Complaint No chief complaint on file.   HPI [name] [name] is a 63 y.o. male.     63 yo M with a chief complaint of left-sided chest pain.  This is on the left upper chest wall described as sharp worse with movement and palpation and deep breathing.  He denies trauma to the area denies cough congestion fever denies hemoptysis unilateral lower extremity edema history of PE or DVT.  He denies recent surgery recent hospitalization recent prolonged travel.  He has had 2 stents placed previously states that his symptoms then were completely different and stated primarily they were shortness of breath on exertion.  Not having that at this time.  The history is provided by the patient and a relative.  Chest Pain  Pain location:  L chest Pain quality: sharp   Pain radiates to:  L shoulder Pain severity:  Moderate Onset quality:  Gradual Duration:  3 days Timing:  Intermittent Progression:  Waxing and waning Chronicity:  New Context: breathing, movement, raising an arm and at rest   Relieved by:  Nothing Worsened by:  Nothing Ineffective treatments:  None tried Associated symptoms: no abdominal pain, no fever, no headache, no palpitations, no shortness of breath and no vomiting     Past Medical History:  Diagnosis Date  . Anginal pain ([place])   . Anxiety   . Arrhythmia   . CAD (coronary artery disease), native coronary artery    a. Nonobstructive CAD by cath 2013 - diffuse distal and branch vessel CAD, no severe disease in the major coronaries, LV mild global hypokinesis, EF 45%. b. ETT-Sestamibi 5/14: EF 31%, small fixed inferior defect with no ischemia.  . Cholecystitis   . Chronic CHF ([place])    a. Mixed ICM/NICM (?EtOH). EF 35% in 2008. Echo 5/13: EF 60-65%, mod LVH, EF 45% on V gram in 12/2011. EF 12/2012: EF 50-55%, mild LVH, inferobasal HK,  mild MR. ETT-Ses 5/14 EF 41%. Cardiac MRI 5/14: EF 44%, mild global HK, subepicardial delayed enhancement in nonspecific RV insertion pattern.  . COLONIC POLYPS, HX OF 12/30/2006  . Gout   . H/O atrial flutter 2007   a. Ablations in 2007, 2008.  [name] Heart murmur   . HEPATITIS B, CHRONIC 12/30/2006  . History of alcohol abuse   . History of hiatal hernia   . History of medication noncompliance   . History of nuclear stress test    Myoview 3/17: + chest pain; EF 33%, downsloping ST depression 2, V4-6, LVH with strain, no ischemia on images; intermediate risk   . HYPERCHOLESTEROLEMIA 07/11/2010  . Hypertension   . Left sciatic nerve pain since 04/2015  . LIVER FUNCTION TESTS, ABNORMAL, HX OF 12/30/2006  . MITRAL REGURGITATION 12/30/2006  . 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 PAF (paroxysmal atrial fibrillation) (H[facility])    On coumadin  . Rectal bleeding 12/18/2011   Scheduled for colonoscopy.    . Shortness of breath dyspnea    with excertion  . Sleep apnea    "suppose to send mask but they never did" (05/03/2015)  . Type II diabetes mellitus [facility])     Patient Active Problem List   Diagnosis Date Noted  . Overweight (BMI 25.0-29.9) 02/28/2017  . Unstable angina ([place]) 08/03/2016  . S/P laparoscopic cholecystectomy 02/07/2016  . Coronary artery disease involving native  coronary artery with unstable angina pectoris ([place]) 12/29/2015  . Diabetes ([place]) 11/12/2015  . Chronic anticoagulation 08/22/2015  . Atrial flutter with controlled response ([place]) 08/22/2015  . RUQ pain   . Cholelithiases- drain in place 06/04/2015  . Essential hypertension   . Chronic diastolic CHF (congestive heart failure) ([place]) 04/24/2015  . Excessive daytime sleepiness 11/25/2014  . OSA (obstructive sleep apnea) 11/25/2014  . NICM (nonischemic cardiomyopathy) ([place]) 03/11/2014  . Anticoagulation goal of INR 2 to 3 10/19/2013  . Eczema 10/19/2013  . Low libido 10/16/2013  . Persistent atrial  fibrillation 10/06/2013  . Rectal bleeding 12/18/2011  . HYPERCHOLESTEROLEMIA 07/11/2010  . DENTAL CARIES 06/30/2010  . GOUT 05/05/2009  . HEPATITIS B, CHRONIC 12/30/2006  . OSTEOCHONDROMA 12/30/2006  . Mitral valve disorder 12/30/2006  . LIVER FUNCTION TESTS, ABNORMAL, HX OF 12/30/2006  . COLONIC POLYPS, HX OF 12/30/2006  . COLONOSCOPY, HX OF 05/12/2001    Past Surgical History:  Procedure Laterality Date  . A FLUTTER ABLATION  2007, 2008   catheter ablation   . CARDIAC CATHETERIZATION N/A 05/03/2015   Procedure: Right/Left Heart Cath and Coronary Angiography;  Surgeon: Larey Dresser, MD;  Location: [facility];  Service: Cardiovascular;  Laterality: N/A;  . CARDIAC CATHETERIZATION N/A 05/03/2015   Procedure: Coronary Stent Intervention;  Surgeon: Jettie Booze, MD;  Location: [facility];  Service: Cardiovascular;  Laterality: N/A;  . CARDIAC CATHETERIZATION N/A 11/04/2015   Procedure: Left Heart Cath and Coronary Angiography;  Surgeon: Larey Dresser, MD;  Location: [facility];  Service: Cardiovascular;  Laterality: N/A;  . CARDIOVERSION N/A 08/24/2015   Procedure: CARDIOVERSION;  Surgeon: Thayer Headings, MD;  Location: [facility] ENDOSCOPY;  Service: Cardiovascular;  Laterality: N/A;  . CHOLECYSTECTOMY N/A 02/07/2016   Procedure: LAPAROSCOPIC CHOLECYSTECTOMY WITH  INTRAOPERATIVE CHOLANGIOGRAM;  Surgeon: Greer Pickerel, MD;  Location: [facility] ORS;  Service: General;  Laterality: N/A;  . CORONARY ANGIOPLASTY  12/05/01  . LEFT HEART CATH AND CORONARY ANGIOGRAPHY N/A 12/28/2016   Procedure: Left Heart Cath and Coronary Angiography;  Surgeon: Larey Dresser, MD;  Location: [facility] LAB;  Service: Cardiovascular;  Laterality: N/A;  . LEFT HEART CATHETERIZATION WITH CORONARY ANGIOGRAM N/A 12/19/2011   Procedure: LEFT HEART CATHETERIZATION WITH CORONARY ANGIOGRAM;  Surgeon: Larey Dresser, MD;  Location: [facility] CATH LAB;  Service: Cardiovascular;  Laterality: N/A;  .  OSTEOCHONDROMA EXCISION Left 1972   "took bone tumor off my shoulder"        Home Medications    Prior to Admission medications   Medication Sig Start Date End Date Taking? Authorizing Provider  amLODipine (NORVASC) 5 MG tablet Take 1 tablet (5 mg total) by mouth daily. 09/29/18  Yes Charlott Rakes, MD  atorvastatin (LIPITOR) 80 MG tablet Take 1 tablet (80 mg total) by mouth daily. 09/29/18  Yes Charlott Rakes, MD  carvedilol (COREG) 25 MG tablet Take 1 tablet (25 mg total) by mouth 2 (two) times daily with a meal. 09/29/18  Yes Newlin, Enobong, MD  furosemide (LASIX) 20 MG tablet TAKE TWO TABLETS BY MOUTH IN THE MORNING, THEN TAKE ONE IN THE EVENING Patient taking differently: Take 20-40 mg by mouth See admin instructions. Take 2 tablets (40 mg) by mouth every morning and 1 tablet (20 mg) in the evening 09/29/18  Yes Newlin, Enobong, MD  hydrALAZINE (APRESOLINE) 50 MG tablet Take 1.5 tablets (75 mg total) by mouth 3 (three) times daily. Patient taking differently: Take 75 mg by mouth 2 (  two) times daily.  06/23/18  Yes Larey Dresser, MD  Insulin Glargine (LANTUS SOLOSTAR) 100 UNIT/ML Solostar Pen Inject 35 Units into the skin every morning. And pen needles 1/day Patient taking differently: Inject 35 Units into the skin daily. And pen needles 1/day 07/08/18  Yes Charlott Rakes, MD  isosorbide mononitrate (IMDUR) 30 MG 24 hr tablet Take 1 tablet (30 mg total) by mouth daily. 06/23/18  Yes Larey Dresser, MD  metFORMIN (GLUCOPHAGE) 1000 MG tablet Take 1 tablet (1,000 mg total) by mouth 2 (two) times daily with a meal. 09/29/18  Yes Newlin, Enobong, MD  nitroGLYCERIN (NITROSTAT) 0.4 MG SL tablet Place 1 tablet (0.4 mg total) under the tongue every 5 (five) minutes as needed for chest pain (x 3 tabs daily). 09/29/18  Yes Charlott Rakes, MD  rivaroxaban (XARELTO) 20 MG TABS tablet Take 1 tablet (20 mg total) by mouth daily with supper. Patient taking differently: Take 20 mg by mouth daily.   05/27/18  Yes Larey Dresser, MD  sotalol (BETAPACE) 120 MG tablet TAKE ONE TABLET BY MOUTH EVERY 12 HOURS Patient taking differently: Take 120 mg by mouth every 12 (twelve) hours.  01/01/18  Yes Charlott Rakes, MD  ACCU-CHEK SOFTCLIX LANCETS lancets Use as instructed for three times daily testing of blood glucose 09/06/16   Angelica Chessman E, MD  Blood Glucose Monitoring Suppl (ACCU-CHEK AVIVA PLUS) w/Device KIT Use as directed for 3 times daily testing of blood glucose 09/06/16   Tresa Garter, MD  chlorpheniramine-HYDROcodone (TUSSIONEX PENNKINETIC ER) 10-8 MG/5ML SUER Take 5 mLs by mouth every 12 (twelve) hours as needed for cough. Patient not taking: Reported on 10/19/2018 09/11/18   Robyn Haber, MD  glucose blood (ACCU-CHEK AVIVA PLUS) test strip Use as instructed for 3 times daily testing of blood glucose 09/06/16   Tresa Garter, MD  Lancet Devices [facility]) lancets Use as instructed for 3 times daily testing of blood glucose 09/06/16   Jegede, Marlena Clipper, MD  TRUEPLUS LANCETS 28G MISC 1 each by Does not apply route 3 (three) times daily. 08/22/16   Tresa Garter, MD    Family History Family History  Problem Relation Age of Onset  . Diabetes Mother   . Hypertension Mother   . Heart attack Neg Hx   . Stroke Neg Hx     Social History Social History   Tobacco Use  . Smoking status: Former Smoker    Packs/day: 4.00    Years: 18.00    Pack years: 72.00    Types: Cigarettes    Last attempt to quit: 08/14/1983    Years since quitting: 35.2  . Smokeless tobacco: Never Used  Substance Use Topics  . Alcohol use: Yes    Alcohol/week: 16.0 standard drinks    Types: 6 Cans of beer, 10 Shots of liquor per week    Comment: occasionally  . Drug use: Yes    Types: Marijuana    Comment: occasionally     Allergies   Shrimp [shellfish allergy]; Ace inhibitors; and Other   Review of Systems Review of Systems  Constitutional: Negative for chills  and fever.  HENT: Negative for congestion and facial swelling.   Eyes: Negative for discharge and visual disturbance.  Respiratory: Negative for shortness of breath.   Cardiovascular: Positive for chest pain. Negative for palpitations.  Gastrointestinal: Negative for abdominal pain, diarrhea and vomiting.  Musculoskeletal: Negative for arthralgias and myalgias.  Skin: Negative for color change and rash.  Neurological:  Negative for tremors, syncope and headaches.  Psychiatric/Behavioral: Negative for confusion and dysphoric mood.     Physical Exam Updated Vital Signs BP (!) 170/99 (BP Location: Right Arm)   Pulse 70   Temp 98.4 F (36.9 C) (Oral)   Resp 20   Ht 6' (1.829 m)   Wt 94 kg   SpO2 99%   BMI 28.10 kg/m   Physical Exam Vitals signs and nursing note reviewed.  Constitutional:      Appearance: He is well-developed.  HENT:     Head: Normocephalic and atraumatic.  Eyes:     Pupils: Pupils are equal, round, and reactive to light.  Neck:     Musculoskeletal: Normal range of motion and neck supple.     Vascular: No JVD.  Cardiovascular:     Rate and Rhythm: Normal rate and regular rhythm.     Heart sounds: No murmur. No friction rub. No gallop.   Pulmonary:     Effort: No respiratory distress.     Breath sounds: No wheezing.  Abdominal:     General: There is no distension.     Tenderness: There is no abdominal tenderness. There is no guarding or rebound.  Musculoskeletal: Normal range of motion.        General: Tenderness (left chest wall pain, reproduces symptoms about ribs 4-6 on the medial clavicular line) present.  Skin:    Coloration: Skin is not pale.     Findings: No rash.  Neurological:     Mental Status: He is alert and oriented to person, place, and time.  Psychiatric:        Behavior: Behavior normal.      ED Treatments / Results  Labs (all labs ordered are listed, but only abnormal results are displayed) Labs Reviewed  CBC WITH  DIFFERENTIAL/PLATELET - Abnormal; Notable for the following components:      Result Value   MCH 25.8 (*)    All other components within normal limits  BASIC METABOLIC PANEL - Abnormal; Notable for the following components:   Glucose, Bld 232 (*)    All other components within normal limits  I-STAT TROPONIN, ED  I-STAT TROPONIN, ED    EKG EKG Interpretation  Date/Time:  _1  yo M with a chief complaint of left-sided chest pain.  This is  reproduced on exam and atypical in nature.  His EKG shows no change from prior.  He has 2 stents that were placed previously and has a history of significant cardiac disease though with his very atypical presentation and pain reproducible on exam I feel he can be delta'ed and if negative discharged home to  follow-up with his cardiologist.  Delta negative.  D/c home.   8:08 PM:  I have discussed the diagnosis/risks/treatment options with the patient and family and believe the pt to be eligible for discharge home to follow-up with PCP. We also discussed returning to the ED immediately if new or worsening sx occur. We discussed the sx which are most concerning (e.g., sudden worsening pain, fever, inability to tolerate by mouth) that necessitate immediate return. Medications administered to the patient during their visit and any new prescriptions provided to the patient are listed below.  Medications given during this visit Medications - No data to display   The patient appears reasonably screen and/or stabilized for discharge and I doubt any other medical condition or other [facility] requiring further screening, evaluation, or treatment in the ED at this time prior to discharge.    Final Clinical Impressions(s) / ED Diagnoses   Final diagnoses:  Atypical chest pain    ED Discharge Orders    None       Deno Etienne, DO 10/19/18 2008

## 2018-10-19 NOTE — ED Triage Notes (Signed)
Pt complains of left sided chest pain x3 days and radiates to his left arm

## 2018-10-20 ENCOUNTER — Ambulatory Visit: Payer: Medicare Other | Attending: Family Medicine | Admitting: Pharmacist

## 2018-10-20 ENCOUNTER — Encounter: Payer: Self-pay | Admitting: Pharmacist

## 2018-10-20 DIAGNOSIS — Z794 Long term (current) use of insulin: Secondary | ICD-10-CM | POA: Diagnosis not present

## 2018-10-20 DIAGNOSIS — E1169 Type 2 diabetes mellitus with other specified complication: Secondary | ICD-10-CM

## 2018-10-20 LAB — GLUCOSE, POCT (MANUAL RESULT ENTRY): POC Glucose: 149 mg/dl — AB (ref 70–99)

## 2018-10-20 NOTE — Patient Instructions (Addendum)
Thank you for coming to see me today. Please do the following:  1. Increase Lantus by 1 to 2 units every 3 days if morning sugars are above 130. Stop increasing your dose once your sugars are consistently under 130 or if you get to 40 units of insulin. 2. Continue metformin twice a day.  3. Remember to release your pinch before pushing your insulin pen and hold it for 5 seconds before removing.  4. Continue checking blood sugars at home.  5. Continue making the lifestyle changes we've discussed together during our visit. Diet and exercise play a significant role in improving your blood sugars.  6. Follow-up with me in 1 month.   Hypoglycemia or low blood sugar:   Low blood sugar can happen quickly and may become an emergency if not treated right away.   While this shouldn't happen often, it can be brought upon if you skip a meal or do not eat enough. Also, if your insulin or other diabetes medications are dosed too high, this can cause your blood sugar to go to low.   Warning signs of low blood sugar include: 1. Feeling shaky or dizzy 2. Feeling weak or tired  3. Excessive hunger 4. Feeling anxious or upset  5. Sweating even when you aren't exercising  What to do if I experience low blood sugar? 1. Check your blood sugar with your meter. If lower than 70, proceed to step 2.  2. Treat with 3-4 glucose tablets or 3 packets of regular sugar. If these aren't around, you can try hard candy. Yet another option would be to drink 4 ounces of fruit juice or 6 ounces of REGULAR soda.  3. Re-check your sugar in 15 minutes. If it is still below 70, do what you did in step 2 again. If has come back up, go ahead and eat a snack or small meal at this time.

## 2018-10-20 NOTE — Progress Notes (Signed)
    S:    PCP: Dr. Margarita Rana  No chief complaint on file.  Patient arrives in good spirits. Presents for diabetes management at the request of Dr. Margarita Rana. Patient was referred on 09/29/18. He was instructed to follow-up with me concerning insulin titration.  Family/Social History:  - FHx:  DM, HTN (mother) - Tobacco: former smoker (quit 08/14/1983) - Alcohol: occasionally drinks   Insurance coverage/medication affordability: [place]  Patient denies adherence with medications. Sometimes misses doses. Current diabetes medications include: Lantus 35 units, metformin 1000 mg BID  Patient denies hypoglycemic events.  Patient reported dietary habits:  - Appetite is down for the last two months - Patient reports that he knows to limit carbs and sweets  Patient-reported exercise habits:  - Denies exercise   Patient denies nocturia.  Patient denies neuropathy. Patient reports visual changes. Patient reports self foot exams.   O:  POCT glucose: 149 Home fasting CBG in the last week:  120 - 184 (1 outlier 303)  2 hour post-prandial/random CBG: 105 - 120  Lab Results  Component Value Date   HGBA1C 9.2 (A) 09/29/2018   There were no vitals filed for this visit.  Lipid Panel     Component Value Date/Time   CHOL 139 04/08/2018 1102   TRIG 112 04/08/2018 1102   HDL 49 04/08/2018 1102   CHOLHDL 2.8 04/08/2018 1102   CHOLHDL 2.2 03/26/2017 1132   VLDL 13 03/26/2017 1132   LDLCALC 68 04/08/2018 1102   LDLDIRECT 111.0 04/22/2015 1559   Clinical ASCVD: Yes - CAD with unstable angina  A/P: Diabetes longstanding currently uncontrolled. Patient is able to verbalize appropriate hypoglycemia management plan. Patient is sometimes adherent with medication. Control is suboptimal due to non-adherence. Additionally, he reports poor technique with injections. Reinforced proper technique; will have him increase Lantus to achieve fasting goals before adding mealtime insulin.  -Will have  patient titrate Lantus 1 unit every 3 days if fasting CBGs > 130 until fasting CBGs reach goal or dose reaches 40 units.  -Continued metformin 1000 mg BID. -Extensively discussed pathophysiology of DM, recommended lifestyle interventions, dietary effects on glycemic control -Counseled on s/sx of and management of hypoglycemia -HM: UTD on vaccines -Next A1C anticipated 12/2018.   ASCVD risk - secondary prevention in patient with DM. Last LDL is controlled. High intensity statin indicated. -Continued atorvastatin 80 mg.   Written patient instructions provided.  Total time in face to face counseling 15 minutes.   Follow up Pharmacist Clinic Visit in 1 month.     Benard Halsted, PharmD, [place] [phone]

## 2018-10-22 ENCOUNTER — Ambulatory Visit (HOSPITAL_COMMUNITY)
Admission: RE | Admit: 2018-10-22 | Discharge: 2018-10-22 | Disposition: A | Discharge status: Home / Self Care | Payer: Medicare Other | Source: Ambulatory Visit | Attending: Family Medicine | Admitting: Family Medicine

## 2018-10-22 ENCOUNTER — Other Ambulatory Visit: Payer: Self-pay

## 2018-10-22 DIAGNOSIS — I4892 Unspecified atrial flutter: Secondary | ICD-10-CM | POA: Diagnosis not present

## 2018-10-22 DIAGNOSIS — I110 Hypertensive heart disease with heart failure: Secondary | ICD-10-CM | POA: Insufficient documentation

## 2018-10-22 DIAGNOSIS — I5032 Chronic diastolic (congestive) heart failure: Secondary | ICD-10-CM | POA: Diagnosis not present

## 2018-10-22 DIAGNOSIS — I5031 Acute diastolic (congestive) heart failure: Secondary | ICD-10-CM | POA: Insufficient documentation

## 2018-10-22 DIAGNOSIS — I2510 Atherosclerotic heart disease of native coronary artery without angina pectoris: Secondary | ICD-10-CM | POA: Insufficient documentation

## 2018-10-22 DIAGNOSIS — E119 Type 2 diabetes mellitus without complications: Secondary | ICD-10-CM | POA: Insufficient documentation

## 2018-10-22 DIAGNOSIS — E785 Hyperlipidemia, unspecified: Secondary | ICD-10-CM | POA: Insufficient documentation

## 2018-10-22 NOTE — Progress Notes (Signed)
  Echocardiogram 2D Echocardiogram has been performed.  Darlina Sicilian M 10/22/2018, 2:20 PM

## 2018-10-24 ENCOUNTER — Telehealth: Payer: Self-pay

## 2018-10-24 NOTE — Telephone Encounter (Signed)
Patient was called to go over ECHO results. Patient states that he has questions about his enlarged heart due to prolonged hypertension.

## 2018-10-24 NOTE — Telephone Encounter (Signed)
-----   Message from Charlott Rakes, MD sent at 10/22/2018  5:53 PM EDT ----- Echocardiogram shows low normal ejection fraction which is stable compared to last echo from 2016.  One of the chambers of the heart is also slightly enlarged and this could be from prolonged hypertension.

## 2018-10-24 NOTE — Telephone Encounter (Signed)
Patient was called and informed of lab results. 

## 2018-10-27 NOTE — Telephone Encounter (Signed)
He has left ventricular hypertrophy which could be from prolonged hypertension.  This can be discussed further at an office visit.

## 2018-10-28 ENCOUNTER — Other Ambulatory Visit (HOSPITAL_COMMUNITY): Payer: Self-pay | Admitting: *Deleted

## 2018-10-28 DIAGNOSIS — I5032 Chronic diastolic (congestive) heart failure: Secondary | ICD-10-CM

## 2018-10-28 MED ORDER — ISOSORBIDE MONONITRATE ER 30 MG PO TB24: 30.0000 mg | ORAL_TABLET | Freq: Every day | ORAL | 6 refills | Status: DC

## 2018-10-30 ENCOUNTER — Telehealth (HOSPITAL_COMMUNITY): Payer: Self-pay | Admitting: Cardiology

## 2018-10-30 ENCOUNTER — Telehealth: Payer: Self-pay | Admitting: Family Medicine

## 2018-10-30 ENCOUNTER — Telehealth (HOSPITAL_COMMUNITY): Payer: Self-pay | Admitting: *Deleted

## 2018-10-30 NOTE — Telephone Encounter (Signed)
Pt's daughter called concerned about pt, she states that pt has been having pain in his chest since the first of the month and it is just continuing on, not getting any better.  Pt went to ER on 3/8, trop neg and felt to be atypical.  Pt had echo done 3/11 which shows EF normalized, up to 50-55% (was 37% in May 2018).  Pt states this feeling is completely different from when he had stents placed previously, he can't really describe the pain but states it is in the same spot, does not radiate, is not worse w/moving or deep breathing, denies SOB.  Advised would send to Dr Aundra Dubin for further recommendations and will call her back.

## 2018-10-30 NOTE — Telephone Encounter (Signed)
Daughter requested that we call patient due to chest pain.    He has a history of CAD and chronic diastolic CHF.  He has had chronic chest pain for about 2 weeks now, continuously. He was seen in the ER on 3/8.  At that time, he had an unremarkable CXR and ECG that showed no change.  Troponin was negative x 2.  He has had no change in the chest pain pattern since that time.  His chest is sore and hurts if he pushes on it.  Pain is not pleuritic and is not related to exertion. It does get worse if he stretches out his arm.  He does not remember injuring his chest wall and has no bruise.   I reviewed his echo done this month, EF 50-55% with moderate LVH and normal RV.  I do not think it is significantly different from the past.   I think that his chest pain seems most likely musculoskeletal.  Think unlikely to be cardiac.  I will not change his meds.  As he feels like there is a "sore spot" or lump in his chest, I asked him to have this evaluated by his PCP.   10 minute phone call  Loralie Champagne 10/30/2018 1:54 PM

## 2018-10-30 NOTE — Telephone Encounter (Signed)
See other phone note, Dr Aundra Dubin called pt's daughter

## 2018-10-30 NOTE — Telephone Encounter (Signed)
Patient was called and was informed of PCP explanation of his ECHO. Patient requested an appointment to discuss further options.

## 2018-10-30 NOTE — Telephone Encounter (Signed)
Patient was schedule for the next available appt which is 03/25 @2 :50

## 2018-10-30 NOTE — Telephone Encounter (Signed)
Noted  

## 2018-11-05 ENCOUNTER — Other Ambulatory Visit: Payer: Self-pay

## 2018-11-05 ENCOUNTER — Ambulatory Visit: Payer: Medicare Other | Attending: Family Medicine | Admitting: Family Medicine

## 2018-11-05 DIAGNOSIS — R0789 Other chest pain: Secondary | ICD-10-CM | POA: Diagnosis not present

## 2018-11-05 DIAGNOSIS — M79602 Pain in left arm: Secondary | ICD-10-CM | POA: Diagnosis not present

## 2018-11-05 DIAGNOSIS — M7542 Impingement syndrome of left shoulder: Secondary | ICD-10-CM | POA: Diagnosis not present

## 2018-11-05 MED ORDER — GABAPENTIN 300 MG PO CAPS: 300.0000 mg | ORAL_CAPSULE | Freq: Every day | ORAL | 1 refills | Status: DC

## 2018-11-05 MED ORDER — CYCLOBENZAPRINE HCL 10 MG PO TABS: 10.0000 mg | ORAL_TABLET | Freq: Every day | ORAL | 1 refills | Status: DC

## 2018-11-05 NOTE — Progress Notes (Signed)
Subjective:  Patient ID: [name] [name], male    DOB: 1956/07/26  Age: 63 y.o. MRN: [id number]  CC: Arm pain  HPI [name] [name] is a 63 year old male with a history of CHF (EF 55 to 60% from echo of 10/2017), atrial fibrillation, CAD (status post DES x2) diabetes mellitus (A1c 9.2) seen for telephone visit. He has questions about his recent echocardiogram which I have discussed with him including findings of LVH which could be secondary to longstanding uncontrolled hypertension.  He complains of discomfort in his left arm and left chest wall which is worse when he lies down on his left side and is worse at night.  He describes sensation as feeling like when he hit the 'funny bone' in his arm.  Endorses tingly feeling but denies shortness of breath.  Symptoms are unrelated to activity. Denies loss of strength in hands.  Past Medical History:  Diagnosis Date  . Anginal pain ([name] [name])   . Anxiety   . Arrhythmia   . CAD (coronary artery disease), native coronary artery    a. Nonobstructive CAD by cath 2013 - diffuse distal and branch vessel CAD, no severe disease in the major coronaries, LV mild global hypokinesis, EF 45%. b. ETT-Sestamibi 5/14: EF 31%, small fixed inferior defect with no ischemia.  . Cholecystitis   . Chronic CHF ([name] [name])    a. Mixed ICM/NICM (?EtOH). EF 35% in 2008. Echo 5/13: EF 60-65%, mod LVH, EF 45% on V gram in 12/2011. EF 12/2012: EF 50-55%, mild LVH, inferobasal HK, mild MR. ETT-Ses 5/14 EF 41%. Cardiac MRI 5/14: EF 44%, mild global HK, subepicardial delayed enhancement in nonspecific RV insertion pattern.  . COLONIC POLYPS, HX OF 12/30/2006  . Gout   . H/O atrial flutter 2007   a. Ablations in 2007, 2008.  [name] Heart murmur   . HEPATITIS B, CHRONIC 12/30/2006  . History of alcohol abuse   . History of hiatal hernia   . History of medication noncompliance   . History of nuclear stress test    Myoview 3/17: + chest pain; EF 33%, downsloping ST depression 2, V4-6, LVH  with strain, no ischemia on images; intermediate risk   . HYPERCHOLESTEROLEMIA 07/11/2010  . Hypertension   . Left sciatic nerve pain since 04/2015  . LIVER FUNCTION TESTS, ABNORMAL, HX OF 12/30/2006  . MITRAL REGURGITATION 12/30/2006  . 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 PAF (paroxysmal atrial fibrillation) (HCC)    On coumadin  . Rectal bleeding 12/18/2011   Scheduled for colonoscopy.    . Shortness of breath dyspnea    with excertion  . Sleep apnea    "suppose to send mask but they never did" (05/03/2015)  . Type II diabetes mellitus ([name] [name])     Past Surgical History:  Procedure Laterality Date  . A FLUTTER ABLATION  2007, 2008   catheter ablation   . CARDIAC CATHETERIZATION N/A 05/03/2015   Procedure: Right/Left Heart Cath and Coronary Angiography;  Surgeon: Larey Dresser, MD;  Location: [place] [facility];  Service: Cardiovascular;  Laterality: N/A;  . CARDIAC CATHETERIZATION N/A 05/03/2015   Procedure: Coronary Stent Intervention;  Surgeon: Jettie Booze, MD;  Location: [place] [facility];  Service: Cardiovascular;  Laterality: N/A;  . CARDIAC CATHETERIZATION N/A 11/04/2015   Procedure: Left Heart Cath and Coronary Angiography;  Surgeon: Larey Dresser, MD;  Location: [place] [facility];  Service: Cardiovascular;  Laterality: N/A;  . CARDIOVERSION N/A 08/24/2015   Procedure:  CARDIOVERSION;  Surgeon: Thayer Headings, MD;  Location[facility] ENDOSCOPY;  Service: Cardiovascular;  Laterality: N/A;  . CHOLECYSTECTOMY N/A 02/07/2016   Procedure: LAPAROSCOPIC CHOLECYSTECTOMY WITH  INTRAOPERATIVE CHOLANGIOGRAM;  Surgeon: Greer Pickerel, MD;  Location: [facility];  Service: General;  Laterality: N/A;  . CORONARY ANGIOPLASTY  12/05/01  . LEFT HEART CATH AND CORONARY ANGIOGRAPHY N/A 12/28/2016   Procedure: Left Heart Cath and Coronary Angiography;  Surgeon: Larey Dresser, MD;  Location: [place] [facility];  Service: Cardiovascular;  Laterality: N/A;  . LEFT HEART CATHETERIZATION WITH  CORONARY ANGIOGRAM N/A 12/19/2011   Procedure: LEFT HEART CATHETERIZATION WITH CORONARY ANGIOGRAM;  Surgeon: Larey Dresser, MD;  Location: [facility] CATH LAB;  Service: Cardiovascular;  Laterality: N/A;  . OSTEOCHONDROMA EXCISION Left 1972   "took bone tumor off my shoulder"    Family History  Problem Relation Age of Onset  . Diabetes Mother   . Hypertension Mother   . Heart attack Neg Hx   . Stroke Neg Hx     Allergies  Allergen Reactions  . Shrimp [Shellfish Allergy] Nausea And Vomiting  . Ace Inhibitors Cough  . Other Hives and Other (See Comments)    Patient reports developing hives after receiving "some antibiotic given in 1980''s at [facility]". He does not know which antibiotic.    Outpatient Medications Prior to Visit  Medication Sig Dispense Refill  . ACCU-CHEK SOFTCLIX LANCETS lancets Use as instructed for three times daily testing of blood glucose 100 each 12  . amLODipine (NORVASC) 5 MG tablet Take 1 tablet (5 mg total) by mouth daily. 90 tablet 1  . atorvastatin (LIPITOR) 80 MG tablet Take 1 tablet (80 mg total) by mouth daily. 90 tablet 1  . Blood Glucose Monitoring Suppl (ACCU-CHEK AVIVA PLUS) w/Device KIT Use as directed for 3 times daily testing of blood glucose 1 kit 0  . carvedilol (COREG) 25 MG tablet Take 1 tablet (25 mg total) by mouth 2 (two) times daily with a meal. 180 tablet 1  . chlorpheniramine-HYDROcodone (TUSSIONEX PENNKINETIC ER) 10-8 MG/5ML SUER Take 5 mLs by mouth every 12 (twelve) hours as needed for cough. 140 mL 0  . furosemide (LASIX) 20 MG tablet TAKE TWO TABLETS BY MOUTH IN THE MORNING, THEN TAKE ONE IN THE EVENING (Patient taking differently: Take 20-40 mg by mouth See admin instructions. Take 2 tablets (40 mg) by mouth every morning and 1 tablet (20 mg) in the evening) 90 tablet 6  . glucose blood (ACCU-CHEK AVIVA PLUS) test strip Use as instructed for 3 times daily testing of blood glucose 100 each 12  . hydrALAZINE (APRESOLINE) 50 MG  tablet Take 1.5 tablets (75 mg total) by mouth 3 (three) times daily. (Patient taking differently: Take 75 mg by mouth 2 (two) times daily. ) 135 tablet 5  . Insulin Glargine (LANTUS SOLOSTAR) 100 UNIT/ML Solostar Pen Inject 35 Units into the skin every morning. And pen needles 1/day (Patient taking differently: Inject 35 Units into the skin daily. And pen needles 1/day) 30 mL 6  . isosorbide mononitrate (IMDUR) 30 MG 24 hr tablet Take 1 tablet (30 mg total) by mouth daily. 30 tablet 6  . Lancet Devices (ACCU-CHEK SOFTCLIX) lancets Use as instructed for 3 times daily testing of blood glucose 1 each 0  . metFORMIN (GLUCOPHAGE) 1000 MG tablet Take 1 tablet (1,000 mg total) by mouth 2 (two) times daily with a meal. 180 tablet 1  . nitroGLYCERIN (NITROSTAT) 0.4 MG SL tablet Place 1  tablet (0.4 mg total) under the tongue every 5 (five) minutes as needed for chest pain (x 3 tabs daily). 60 tablet 1  . rivaroxaban (XARELTO) 20 MG TABS tablet Take 1 tablet (20 mg total) by mouth daily with supper. (Patient taking differently: Take 20 mg by mouth daily. ) 30 tablet 6  . sotalol (BETAPACE) 120 MG tablet TAKE ONE TABLET BY MOUTH EVERY 12 HOURS (Patient taking differently: Take 120 mg by mouth every 12 (twelve) hours. ) 180 tablet 2  . TRUEPLUS LANCETS 28G MISC 1 each by Does not apply route 3 (three) times daily. 100 each 12   No facility-administered medications prior to visit.      ROS Review of Systems  Constitutional: Negative for activity change and appetite change.  HENT: Negative for sinus pressure and sore throat.   Eyes: Negative for visual disturbance.  Respiratory: Negative for cough, chest tightness and shortness of breath.   Cardiovascular: Negative for chest pain and leg swelling.  Gastrointestinal: Negative for abdominal distention, abdominal pain, constipation and diarrhea.  Endocrine: Negative.   Genitourinary: Negative for dysuria.  Musculoskeletal: Negative for joint swelling and  myalgias.  Skin: Negative for rash.  Allergic/Immunologic: Negative.   Neurological: Negative for weakness, light-headedness and numbness.  Psychiatric/Behavioral: Negative for dysphoric mood and suicidal ideas.    Objective:  There were no vitals taken for this visit.  BP/Weight 10/19/2018 09/29/2018 0/05/711  Systolic BP 197 588 325  Diastolic BP 99 88 99  Wt. (Lbs) 207.2 207.2 -  BMI 28.1 28.1 -      Physical Exam AAOX3  CMP Latest Ref Rng & Units 10/19/2018 05/27/2018 04/08/2018  Glucose 70 - 99 mg/dL 232(H) 241(H) 147(H)  BUN 8 - 23 mg/dL _0 Creatinine 0.61 - 1.24 mg/dL 1.23 1.16 1.13  Sodium 135 - 145 mmol/L 138 138 140  Potassium 3.5 - 5.1 mmol/L 3.9 4.1 3.6  Chloride 98 - 111 mmol/L 107 106 104  CO2 22 - 32 mmol/L _1 Calcium 8.9 - 10.3 mg/dL 9.4 9.2 9.1  Total Protein 6.0 - 8.5 g/dL - - 7.1  Total Bilirubin 0.0 - 1.2 mg/dL - - 0.4  Alkaline Phos 39 - 117 IU/L - - 65  AST 0 - 40 IU/L - - 15  ALT 0 - 44 IU/L - - 12    Lipid Panel     Component Value Date/Time   CHOL 139 04/08/2018 1102   TRIG 112 04/08/2018 1102   HDL 49 04/08/2018 1102   CHOLHDL 2.8 04/08/2018 1102   CHOLHDL 2.2 03/26/2017 1132   VLDL 13 03/26/2017 1132   LDLCALC 68 04/08/2018 1102   LDLDIRECT 111.0 04/22/2015 1559    CBC    Component Value Date/Time   WBC 6.1 10/19/2018 1607   RBC 5.31 10/19/2018 1607   HGB 13.7 10/19/2018 1607   HGB 14.0 07/29/2017 1101   HCT 43.4 10/19/2018 1607   HCT 44.2 07/29/2017 1101   PLT 178 10/19/2018 1607   PLT 187 07/29/2017 1101   MCV 81.7 10/19/2018 1607   MCV 83 07/29/2017 1101   MCH 25.8 (L) 10/19/2018 1607   MCHC 31.6 10/19/2018 1607   RDW 15.1 10/19/2018 1607   RDW 16.8 (H) 07/29/2017 1101   LYMPHSABS 2.0 10/19/2018 1607   LYMPHSABS 2.2 07/29/2017 1101   MONOABS 0.6 10/19/2018 1607   EOSABS 0.2 10/19/2018 1607   EOSABS 0.4 07/29/2017 1101   BASOSABS 0.1 10/19/2018 1607   BASOSABS 0.1 07/29/2017  1101    Lab Results   Component Value Date   HGBA1C 9.2 (A) 09/29/2018    Assessment & Plan:   1. Impingement syndrome of left shoulder Discussed stretching exercises - cyclobenzaprine (FLEXERIL) 10 MG tablet; Take 1 tablet (10 mg total) by mouth at bedtime.  Dispense: 30 tablet; Refill: 1 - gabapentin (NEURONTIN) 300 MG capsule; Take 1 capsule (300 mg total) by mouth at bedtime.  Dispense: 30 capsule; Refill: 1 Advised that if symptoms worsen he would need to go to the emergency room.  Meds ordered this encounter  Medications  . cyclobenzaprine (FLEXERIL) 10 MG tablet    Sig: Take 1 tablet (10 mg total) by mouth at bedtime.    Dispense:  30 tablet    Refill:  1  . gabapentin (NEURONTIN) 300 MG capsule    Sig: Take 1 capsule (300 mg total) by mouth at bedtime.    Dispense:  30 capsule    Refill:  1    Follow-up:  Follow up of chronic medical conditions       Charlott Rakes, MD, FAAFP. [facility] and [facility] [place], [place]   11/05/2018, 5:26 PM

## 2018-11-20 ENCOUNTER — Ambulatory Visit: Payer: Medicare Other | Admitting: Pharmacist

## 2018-12-03 ENCOUNTER — Other Ambulatory Visit (HOSPITAL_COMMUNITY): Payer: Self-pay | Admitting: Cardiology

## 2018-12-03 DIAGNOSIS — I10 Essential (primary) hypertension: Secondary | ICD-10-CM

## 2018-12-05 ENCOUNTER — Other Ambulatory Visit (HOSPITAL_COMMUNITY): Payer: Self-pay | Admitting: Cardiology

## 2018-12-05 DIAGNOSIS — I10 Essential (primary) hypertension: Secondary | ICD-10-CM

## 2019-01-26 ENCOUNTER — Other Ambulatory Visit (HOSPITAL_COMMUNITY): Payer: Self-pay | Admitting: Cardiology

## 2019-01-27 ENCOUNTER — Other Ambulatory Visit (HOSPITAL_COMMUNITY): Payer: Self-pay

## 2019-01-27 MED ORDER — RIVAROXABAN 20 MG PO TABS: 20.0000 mg | ORAL_TABLET | Freq: Every day | ORAL | 6 refills | Status: DC

## 2019-03-13 ENCOUNTER — Ambulatory Visit
Admission: EM | Admit: 2019-03-13 | Discharge: 2019-03-13 | Disposition: A | Discharge status: Home / Self Care | Payer: Medicare Other | Attending: Emergency Medicine | Admitting: Emergency Medicine

## 2019-03-13 DIAGNOSIS — B9689 Other specified bacterial agents as the cause of diseases classified elsewhere: Secondary | ICD-10-CM

## 2019-03-13 DIAGNOSIS — N10 Acute tubulo-interstitial nephritis: Secondary | ICD-10-CM | POA: Diagnosis present

## 2019-03-13 LAB — POCT FASTING CBG KUC MANUAL ENTRY: POCT Glucose (KUC): 140 mg/dL — AB (ref 70–99)

## 2019-03-13 LAB — POCT URINALYSIS DIP (MANUAL ENTRY)
Glucose, UA: NEGATIVE mg/dL
Nitrite, UA: POSITIVE — AB
Protein Ur, POC: >=300 mg/dL — AB
Spec Grav, UA: 1.025 (ref 1.010–1.025)
Urobilinogen, UA: 1 E.U./dL
pH, UA: 6 (ref 5.0–8.0)

## 2019-03-13 MED ORDER — ACETAMINOPHEN 325 MG PO TABS
975.0000 mg | ORAL_TABLET | Freq: Once | ORAL | Status: AC
  Administered 2019-03-13: 975 mg via ORAL

## 2019-03-13 MED ORDER — CIPROFLOXACIN HCL 500 MG PO TABS: 500.0000 mg | ORAL_TABLET | Freq: Two times a day (BID) | ORAL | 0 refills | Status: AC

## 2019-03-13 MED ORDER — ONDANSETRON 4 MG PO TBDP: 4.0000 mg | ORAL_TABLET | Freq: Three times a day (TID) | ORAL | 0 refills | Status: DC | PRN

## 2019-03-13 MED ORDER — CEFTRIAXONE SODIUM 1 G IJ SOLR
1.0000 g | Freq: Once | INTRAMUSCULAR | Status: AC
  Administered 2019-03-13: 1 g via INTRAMUSCULAR

## 2019-03-13 NOTE — Discharge Instructions (Signed)
Your urine is consistent with a kidney infection.  Tylenol as needed for pain or fevers. We have started antibiotics today, you can go ahead and start the pill antibiotics as well.  Zofran every 8 hours as needed for nausea or vomiting.   Small frequent sips of fluids- Pedialyte, Gatorade, water, broth- to maintain hydration.   If any worsening of symptoms, weakness, dizziness, unable to urinate, pain, loss of consciousness, or otherwise worsening please go to the ER.

## 2019-03-13 NOTE — ED Provider Notes (Signed)
[facility]    CSN: [id number] Arrival date & time: 03/13/19  1551     History   Chief Complaint Chief Complaint  Patient presents with  . Emesis    HPI [name] [name] is a 63 y.o. male.   [name] [name] presents with complaints of weakness. Last night felt like he needed to urinate or have bm and was unable to pass either. Vomited last night approximately 3 times, no known blood. Vomited last at approximately 8 this am. Urinated a small amount now. Only able to urinate a small amount. Still unable to pass a complete bowel movement. No abdominal pain. No headache or body aches. No known ill contacts. No known fevers. Has drank some fluids today. Hasn't eaten. No URI symptoms. No current nausea. No previous urinary history, no known prostate issues. No pain with urination. Some back pain for the past few days, he has felt it was related to laying around. Some shortness of breath. No chest pain . No cough, however. Shortness of breath even when at rest. No palpitations. Sometimes dizzy. Generally feels weak, like he might fall when he stands up. Not currently working, no one in his household is working. Hx of DM, arrhythmia, CAD, CHF, afib, htn, anticoagulation.     ROS per HPI, negative if not otherwise mentioned.      Past Medical History:  Diagnosis Date  . Anginal pain ([place])   . Anxiety   . Arrhythmia   . CAD (coronary artery disease), native coronary artery    a. Nonobstructive CAD by cath 2013 - diffuse distal and branch vessel CAD, no severe disease in the major coronaries, LV mild global hypokinesis, EF 45%. b. ETT-Sestamibi 5/14: EF 31%, small fixed inferior defect with no ischemia.  . Cholecystitis   . Chronic CHF ([place])    a. Mixed ICM/NICM (?EtOH). EF 35% in 2008. Echo 5/13: EF 60-65%, mod LVH, EF 45% on V gram in 12/2011. EF 12/2012: EF 50-55%, mild LVH, inferobasal HK, mild MR. ETT-Ses 5/14 EF 41%. Cardiac MRI 5/14: EF 44%, mild global HK,  subepicardial delayed enhancement in nonspecific RV insertion pattern.  . COLONIC POLYPS, HX OF 12/30/2006  . Gout   . H/O atrial flutter 2007   a. Ablations in 2007, 2008.  [name] Heart murmur   . HEPATITIS B, CHRONIC 12/30/2006  . History of alcohol abuse   . History of hiatal hernia   . History of medication noncompliance   . History of nuclear stress test    Myoview 3/17: + chest pain; EF 33%, downsloping ST depression 2, V4-6, LVH with strain, no ischemia on images; intermediate risk   . HYPERCHOLESTEROLEMIA 07/11/2010  . Hypertension   . Left sciatic nerve pain since 04/2015  . LIVER FUNCTION TESTS, ABNORMAL, HX OF 12/30/2006  . MITRAL REGURGITATION 12/30/2006  . 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 PAF (paroxysmal atrial fibrillation) ([facility])    On coumadin  . Rectal bleeding 12/18/2011   Scheduled for colonoscopy.    . Shortness of breath dyspnea    with excertion  . Sleep apnea    "suppose to send mask but they never did" (05/03/2015)  . Type II diabetes mellitus [facility])     Patient Active Problem List   Diagnosis Date Noted  . Overweight (BMI 25.0-29.9) 02/28/2017  . Unstable angina ([place]) 08/03/2016  . S/P laparoscopic cholecystectomy 02/07/2016  . Coronary artery disease involving native coronary artery with unstable angina pectoris ([place]) 12/29/2015  .  Diabetes ([place]) 11/12/2015  . Chronic anticoagulation 08/22/2015  . Atrial flutter with controlled response ([place]) 08/22/2015  . RUQ pain   . Cholelithiases- drain in place 06/04/2015  . Essential hypertension   . Chronic diastolic CHF (congestive heart failure) ([place]) 04/24/2015  . Excessive daytime sleepiness 11/25/2014  . OSA (obstructive sleep apnea) 11/25/2014  . NICM (nonischemic cardiomyopathy) ([place]) 03/11/2014  . Anticoagulation goal of INR 2 to 3 10/19/2013  . Eczema 10/19/2013  . Low libido 10/16/2013  . Persistent atrial fibrillation 10/06/2013  . Rectal bleeding 12/18/2011  . HYPERCHOLESTEROLEMIA  07/11/2010  . DENTAL CARIES 06/30/2010  . GOUT 05/05/2009  . HEPATITIS B, CHRONIC 12/30/2006  . OSTEOCHONDROMA 12/30/2006  . Mitral valve disorder 12/30/2006  . LIVER FUNCTION TESTS, ABNORMAL, HX OF 12/30/2006  . COLONIC POLYPS, HX OF 12/30/2006  . COLONOSCOPY, HX OF 05/12/2001    Past Surgical History:  Procedure Laterality Date  . A FLUTTER ABLATION  2007, 2008   catheter ablation   . CARDIAC CATHETERIZATION N/A 05/03/2015   Procedure: Right/Left Heart Cath and Coronary Angiography;  Surgeon: Larey Dresser, MD;  Location: [place] [facility];  Service: Cardiovascular;  Laterality: N/A;  . CARDIAC CATHETERIZATION N/A 05/03/2015   Procedure: Coronary Stent Intervention;  Surgeon: Jettie Booze, MD;  Location: [place] [facility];  Service: Cardiovascular;  Laterality: N/A;  . CARDIAC CATHETERIZATION N/A 11/04/2015   Procedure: Left Heart Cath and Coronary Angiography;  Surgeon: Larey Dresser, MD;  Location: [place] [facility];  Service: Cardiovascular;  Laterality: N/A;  . CARDIOVERSION N/A 08/24/2015   Procedure: CARDIOVERSION;  Surgeon: Thayer Headings, MD;  Location: [facility] ENDOSCOPY;  Service: Cardiovascular;  Laterality: N/A;  . CHOLECYSTECTOMY N/A 02/07/2016   Procedure: LAPAROSCOPIC CHOLECYSTECTOMY WITH  INTRAOPERATIVE CHOLANGIOGRAM;  Surgeon: Greer Pickerel, MD;  Location: [facility] ORS;  Service: General;  Laterality: N/A;  . CORONARY ANGIOPLASTY  12/05/01  . LEFT HEART CATH AND CORONARY ANGIOGRAPHY N/A 12/28/2016   Procedure: Left Heart Cath and Coronary Angiography;  Surgeon: Larey Dresser, MD;  Location: [place] [facility];  Service: Cardiovascular;  Laterality: N/A;  . LEFT HEART CATHETERIZATION WITH CORONARY ANGIOGRAM N/A 12/19/2011   Procedure: LEFT HEART CATHETERIZATION WITH CORONARY ANGIOGRAM;  Surgeon: Larey Dresser, MD;  Location[facility] CATH LAB;  Service: Cardiovascular;  Laterality: N/A;  . OSTEOCHONDROMA EXCISION Left 1972   "took bone tumor off my shoulder"       Home  Medications    Prior to Admission medications   Medication Sig Start Date End Date Taking? Authorizing Provider  ACCU-CHEK SOFTCLIX LANCETS lancets Use as instructed for three times daily testing of blood glucose 09/06/16   Jegede, Olugbemiga E, MD  amLODipine (NORVASC) 5 MG tablet Take 1 tablet (5 mg total) by mouth daily. 09/29/18   Charlott Rakes, MD  atorvastatin (LIPITOR) 80 MG tablet Take 1 tablet (80 mg total) by mouth daily. 09/29/18   Charlott Rakes, MD  Blood Glucose Monitoring Suppl (ACCU-CHEK AVIVA PLUS) w/Device KIT Use as directed for 3 times daily testing of blood glucose 09/06/16   Jegede, Olugbemiga E, MD  carvedilol (COREG) 25 MG tablet Take 1 tablet (25 mg total) by mouth 2 (two) times daily with a meal. 09/29/18   Charlott Rakes, MD  chlorpheniramine-HYDROcodone (TUSSIONEX PENNKINETIC ER) 10-8 MG/5ML SUER Take 5 mLs by mouth every 12 (twelve) hours as needed for cough. 09/11/18   Robyn Haber, MD  ciprofloxacin (CIPRO) 500 MG tablet Take 1 tablet (500 mg total) by mouth  every 12 (twelve) hours for 7 days. 03/13/19 03/20/19  Zigmund Gottron, NP  cyclobenzaprine (FLEXERIL) 10 MG tablet Take 1 tablet (10 mg total) by mouth at bedtime. 11/05/18   Charlott Rakes, MD  furosemide (LASIX) 20 MG tablet TAKE TWO TABLETS BY MOUTH IN THE MORNING, THEN TAKE ONE IN THE EVENING Patient taking differently: Take 20-40 mg by mouth See admin instructions. Take 2 tablets (40 mg) by mouth every morning and 1 tablet (20 mg) in the evening 09/29/18   Charlott Rakes, MD  gabapentin (NEURONTIN) 300 MG capsule Take 1 capsule (300 mg total) by mouth at bedtime. 11/05/18   Charlott Rakes, MD  glucose blood (ACCU-CHEK AVIVA PLUS) test strip Use as instructed for 3 times daily testing of blood glucose 09/06/16   Jegede, Olugbemiga E, MD  hydrALAZINE (APRESOLINE) 50 MG tablet Take 1.5 tablets (75 mg total) by mouth 3 (three) times daily. 12/04/18   Larey Dresser, MD  Insulin Glargine (LANTUS SOLOSTAR) 100  UNIT/ML Solostar Pen Inject 35 Units into the skin every morning. And pen needles 1/day Patient taking differently: Inject 35 Units into the skin daily. And pen needles 1/day 07/08/18   Charlott Rakes, MD  isosorbide mononitrate (IMDUR) 30 MG 24 hr tablet Take 1 tablet (30 mg total) by mouth daily. 10/28/18   Larey Dresser, MD  Lancet Devices [facility]) lancets Use as instructed for 3 times daily testing of blood glucose 09/06/16   Tresa Garter, MD  metFORMIN (GLUCOPHAGE) 1000 MG tablet Take 1 tablet (1,000 mg total) by mouth 2 (two) times daily with a meal. 09/29/18   Charlott Rakes, MD  nitroGLYCERIN (NITROSTAT) 0.4 MG SL tablet Place 1 tablet (0.4 mg total) under the tongue every 5 (five) minutes as needed for chest pain (x 3 tabs daily). 09/29/18   Charlott Rakes, MD  ondansetron (ZOFRAN-ODT) 4 MG disintegrating tablet Take 1 tablet (4 mg total) by mouth every 8 (eight) hours as needed for nausea or vomiting. 03/13/19   Zigmund Gottron, NP  rivaroxaban (XARELTO) 20 MG TABS tablet Take 1 tablet (20 mg total) by mouth daily with supper. 01/27/19   Larey Dresser, MD  sotalol (BETAPACE) 120 MG tablet TAKE ONE TABLET BY MOUTH EVERY 12 HOURS Patient taking differently: Take 120 mg by mouth every 12 (twelve) hours.  01/01/18   Charlott Rakes, MD  TRUEPLUS LANCETS 28G MISC 1 each by Does not apply route 3 (three) times daily. 08/22/16   Tresa Garter, MD    Family History Family History  Problem Relation Age of Onset  . Diabetes Mother   . Hypertension Mother   . Heart attack Neg Hx   . Stroke Neg Hx     Social History Social History   Tobacco Use  . Smoking status: Former Smoker    Packs/day: 4.00    Years: 18.00    Pack years: 72.00    Types: Cigarettes    Quit date: 08/14/1983    Years since quitting: 35.6  . Smokeless tobacco: Never Used  Substance Use Topics  . Alcohol use: Yes    Alcohol/week: 16.0 standard drinks    Types: 6 Cans of beer, 10 Shots of  liquor per week    Comment: occasionally  . Drug use: Yes    Types: Marijuana    Comment: occasionally     Allergies   Shrimp [shellfish allergy], Ace inhibitors, and Other   Review of Systems Review of Systems   Physical Exam Triage  Vital Signs ED Triage Vitals  Enc Vitals Group     BP 03/13/19 1609 (!) 147/87     Pulse Rate 03/13/19 1609 83     Resp 03/13/19 1609 18     Temp 03/13/19 1609 100.1 F (37.8 C)     Temp Source 03/13/19 1609 Oral     SpO2 03/13/19 1609 95 %     Weight --      Height --      Head Circumference --      Peak Flow --      Pain Score 03/13/19 1610 0     Pain Loc --      Pain Edu? --      Excl. in [place]? --    No data found.  Updated Vital Signs BP (!) 147/87 (BP Location: Left Arm)   Pulse 83   Temp 100.1 F (37.8 C) (Oral)   Resp 18   SpO2 95%    Physical Exam Constitutional:      Appearance: He is well-developed. He is ill-appearing.  Cardiovascular:     Rate and Rhythm: Normal rate.  Pulmonary:     Effort: Pulmonary effort is normal. No respiratory distress.  Abdominal:     Tenderness: There is no abdominal tenderness. There is no right CVA tenderness or left CVA tenderness.  Skin:    General: Skin is warm and dry.  Neurological:     Mental Status: He is alert and oriented to person, place, and time.      UC Treatments / Results  Labs (all labs ordered are listed, but only abnormal results are displayed) Labs Reviewed  POCT URINALYSIS DIP (MANUAL ENTRY) - Abnormal; Notable for the following components:      Result Value   Clarity, UA cloudy (*)    Bilirubin, UA small (*)    Ketones, POC UA trace (5) (*)    Blood, UA moderate (*)    Protein Ur, POC >=300 (*)    Nitrite, UA Positive (*)    Leukocytes, UA Large (3+) (*)    All other components within normal limits  POCT FASTING CBG [facility] MANUAL ENTRY - Abnormal; Notable for the following components:   POCT Glucose ([facility]) 140 (*)    All other components within normal  limits  URINE CULTURE    EKG   Radiology No results found.  Procedures Procedures (including critical care time)  Medications Ordered in UC Medications  cefTRIAXone (ROCEPHIN) injection 1 g (has no administration in time range)    Initial Impression / Assessment and Plan / UC Course  I have reviewed the triage vital signs and the nursing notes.  Pertinent labs & imaging results that were available during my care of the patient were reviewed by me and considered in my medical decision making (see chart for details).     No emesis since this morning. Tolerating liquids without vomiting. Low grade temp here today. Tylenol administered. Urinary symptoms, likely pyelonephritis. Rocephin provided here in clinic and course of cipro provided. Strict er precautions discussed. Patient verbalized understanding and agreeable to plan.  Ambulatory out of clinic without difficulty.  His daughter provides transportation.  Final Clinical Impressions(s) / UC Diagnoses   Final diagnoses:  Acute pyelonephritis     Discharge Instructions     Your urine is consistent with a kidney infection.  Tylenol as needed for pain or fevers. We have started antibiotics today, you can go ahead and start the pill antibiotics as well.  Zofran every 8 hours as needed for nausea or vomiting.   Small frequent sips of fluids- Pedialyte, Gatorade, water, broth- to maintain hydration.   If any worsening of symptoms, weakness, dizziness, unable to urinate, pain, loss of consciousness, or otherwise worsening please go to the ER.     ED Prescriptions    Medication Sig Dispense Auth. Provider   ciprofloxacin (CIPRO) 500 MG tablet Take 1 tablet (500 mg total) by mouth every 12 (twelve) hours for 7 days. 14 tablet Burky, Lanelle Bal B, NP   ondansetron (ZOFRAN-ODT) 4 MG disintegrating tablet Take 1 tablet (4 mg total) by mouth every 8 (eight) hours as needed for nausea or vomiting. 12 tablet Zigmund Gottron, NP      Controlled Substance Prescriptions [place] Controlled Substance Registry consulted? Not Applicable   Zigmund Gottron, NP 03/13/19 [phone]

## 2019-03-13 NOTE — ED Triage Notes (Signed)
Pt states was unable to urinate last night, has the urge but unable urinate. States vomiting all night until this am.

## 2019-03-15 LAB — URINE CULTURE: Culture: >=100000 — AB

## 2019-03-16 ENCOUNTER — Telehealth (HOSPITAL_COMMUNITY): Payer: Self-pay | Admitting: Emergency Medicine

## 2019-03-16 NOTE — Telephone Encounter (Signed)
Urine culture was positive for e coli and was given cipro  at urgent care visit. Pt contacted and made aware, educated on completing antibiotic and to follow up if symptoms are persistent. Verbalized understanding.

## 2019-03-23 ENCOUNTER — Telehealth: Payer: Self-pay | Admitting: *Deleted

## 2019-03-23 ENCOUNTER — Telehealth (INDEPENDENT_AMBULATORY_CARE_PROVIDER_SITE_OTHER): Payer: Medicare Other | Admitting: Family Medicine

## 2019-03-23 ENCOUNTER — Other Ambulatory Visit: Payer: Self-pay

## 2019-03-23 DIAGNOSIS — Z789 Other specified health status: Secondary | ICD-10-CM

## 2019-03-23 DIAGNOSIS — I5032 Chronic diastolic (congestive) heart failure: Secondary | ICD-10-CM

## 2019-03-23 DIAGNOSIS — N12 Tubulo-interstitial nephritis, not specified as acute or chronic: Secondary | ICD-10-CM | POA: Diagnosis not present

## 2019-03-23 DIAGNOSIS — I10 Essential (primary) hypertension: Secondary | ICD-10-CM | POA: Diagnosis not present

## 2019-03-23 DIAGNOSIS — Z7289 Other problems related to lifestyle: Secondary | ICD-10-CM

## 2019-03-23 DIAGNOSIS — E1165 Type 2 diabetes mellitus with hyperglycemia: Secondary | ICD-10-CM

## 2019-03-23 DIAGNOSIS — I4892 Unspecified atrial flutter: Secondary | ICD-10-CM

## 2019-03-23 DIAGNOSIS — Z7689 Persons encountering health services in other specified circumstances: Secondary | ICD-10-CM

## 2019-03-23 LAB — CBC
HCT: 34.9 % — ABNORMAL LOW (ref 39.0–52.0)
Hemoglobin: 11.5 g/dL — ABNORMAL LOW (ref 13.0–17.0)
MCHC: 32.8 g/dL (ref 30.0–36.0)
MCV: 82.5 fl (ref 78.0–100.0)
Platelets: 354 10*3/uL (ref 150.0–400.0)
RBC: 4.23 Mil/uL (ref 4.22–5.81)
RDW: 15.7 % — ABNORMAL HIGH (ref 11.5–15.5)
WBC: 8.8 10*3/uL (ref 4.0–10.5)

## 2019-03-23 LAB — URINALYSIS
Bilirubin Urine: NEGATIVE
Hgb urine dipstick: NEGATIVE
Ketones, ur: NEGATIVE
Leukocytes,Ua: NEGATIVE
Nitrite: NEGATIVE
Specific Gravity, Urine: 1.025 (ref 1.000–1.030)
Total Protein, Urine: NEGATIVE
Urine Glucose: NEGATIVE
Urobilinogen, UA: 0.2 (ref 0.0–1.0)
pH: 5.5 (ref 5.0–8.0)

## 2019-03-23 LAB — COMPREHENSIVE METABOLIC PANEL
ALT: 33 U/L (ref 0–53)
AST: 28 U/L (ref 0–37)
Albumin: 3.5 g/dL (ref 3.5–5.2)
Alkaline Phosphatase: 72 U/L (ref 39–117)
BUN: 12 mg/dL (ref 6–23)
CO2: 26 mEq/L (ref 19–32)
Calcium: 9.2 mg/dL (ref 8.4–10.5)
Chloride: 102 mEq/L (ref 96–112)
Creatinine, Ser: 1.06 mg/dL (ref 0.40–1.50)
GFR: 84.85 mL/min (ref >60.00)
Glucose, Bld: 94 mg/dL (ref 70–99)
Potassium: 4.1 mEq/L (ref 3.5–5.1)
Sodium: 137 mEq/L (ref 135–145)
Total Bilirubin: 0.6 mg/dL (ref 0.2–1.2)
Total Protein: 7.1 g/dL (ref 6.0–8.3)

## 2019-03-23 LAB — HEMOGLOBIN A1C: Hgb A1c MFr Bld: 6.8 % — ABNORMAL HIGH (ref 4.6–6.5)

## 2019-03-23 NOTE — Progress Notes (Signed)
Virtual Visit via Video Note  I connected with [name] [name] on 03/23/19 at  1:00 PM EDT by a video enabled telemedicine application 2/2 [id number] pandemic and verified that I am speaking with the correct person using two identifiers.  Location patient: home Location provider:work or home office Persons participating in the virtual visit: patient, provider, daughter-Brittany  I discussed the limitations of evaluation and management by telemedicine and the availability of in person appointments. The patient expressed understanding and agreed to proceed.   HPI: Pt is a 63 yo male with pmh sig for CAD, HTN, DM II, Afib, CHF.  Pt previously seen at [facility] by Dr. Margarita Rana.  DM II: -lost meter.   -fsbs 140 at a recent appointment -taking metformin 1000 mg BID, lantus 38 units. -may drink a few bottles of water per day. -eating can foods, some frozen meals, and out (fast foods/restaurants)  Acute issue: -pt endorses being sick recently  -seen at [facility], told had kidney infection -completed abx course -feeling better but still having back pain  CHF, afib, CAD -stents x2 -xarelto -foresemide 20 mg -denies SOB, CP, LE edema, changes in weight -has never had a fluid restriction -has not f/u with Cards  HTN: -checking bp 190/130 -hydalazine, carvedilol   Social Hx: Pt drinking 1 pint of EOH per day.  States has not had a drink in the last 1-2 wks since being sick.  States can cut down without issue.    ROS: See pertinent positives and negatives per HPI.  Past Medical History:  Diagnosis Date  . Anginal pain ([place])   . Anxiety   . Arrhythmia   . CAD (coronary artery disease), native coronary artery    a. Nonobstructive CAD by cath 2013 - diffuse distal and branch vessel CAD, no severe disease in the major coronaries, LV mild global hypokinesis, EF 45%. b. ETT-Sestamibi 5/14: EF 31%, small fixed inferior defect with no ischemia.  .  Cholecystitis   . Chronic CHF ([place])    a. Mixed ICM/NICM (?EtOH). EF 35% in 2008. Echo 5/13: EF 60-65%, mod LVH, EF 45% on V gram in 12/2011. EF 12/2012: EF 50-55%, mild LVH, inferobasal HK, mild MR. ETT-Ses 5/14 EF 41%. Cardiac MRI 5/14: EF 44%, mild global HK, subepicardial delayed enhancement in nonspecific RV insertion pattern.  . COLONIC POLYPS, HX OF 12/30/2006  . Gout   . H/O atrial flutter 2007   a. Ablations in 2007, 2008.  [name] Heart murmur   . HEPATITIS B, CHRONIC 12/30/2006  . History of alcohol abuse   . History of hiatal hernia   . History of medication noncompliance   . History of nuclear stress test    Myoview 3/17: + chest pain; EF 33%, downsloping ST depression 2, V4-6, LVH with strain, no ischemia on images; intermediate risk   . HYPERCHOLESTEROLEMIA 07/11/2010  . Hypertension   . Left sciatic nerve pain since 04/2015  . LIVER FUNCTION TESTS, ABNORMAL, HX OF 12/30/2006  . MITRAL REGURGITATION 12/30/2006  . 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 PAF (paroxysmal atrial fibrillation) (HCC)    On coumadin  . Rectal bleeding 12/18/2011   Scheduled for colonoscopy.    . Shortness of breath dyspnea    with excertion  . Sleep apnea    "suppose to send mask but they never did" (05/03/2015)  . Type II diabetes mellitus ([place])     Past Surgical History:  Procedure Laterality Date  . A FLUTTER  ABLATION  2007, 2008   catheter ablation   . CARDIAC CATHETERIZATION N/A 05/03/2015   Procedure: Right/Left Heart Cath and Coronary Angiography;  Surgeon: Larey Dresser, MD;  Location: [place] [facility];  Service: Cardiovascular;  Laterality: N/A;  . CARDIAC CATHETERIZATION N/A 05/03/2015   Procedure: Coronary Stent Intervention;  Surgeon: Jettie Booze, MD;  Location: [place] [facility];  Service: Cardiovascular;  Laterality: N/A;  . CARDIAC CATHETERIZATION N/A 11/04/2015   Procedure: Left Heart Cath and Coronary Angiography;  Surgeon: Larey Dresser, MD;  Location: [place] [facility];  Service: Cardiovascular;  Laterality: N/A;  . CARDIOVERSION N/A 08/24/2015   Procedure: CARDIOVERSION;  Surgeon: Thayer Headings, MD;  Location: [facility] ENDOSCOPY;  Service: Cardiovascular;  Laterality: N/A;  . CHOLECYSTECTOMY N/A 02/07/2016   Procedure: LAPAROSCOPIC CHOLECYSTECTOMY WITH  INTRAOPERATIVE CHOLANGIOGRAM;  Surgeon: Greer Pickerel, MD;  Location: [facility] ORS;  Service: General;  Laterality: N/A;  . CORONARY ANGIOPLASTY  12/05/01  . LEFT HEART CATH AND CORONARY ANGIOGRAPHY N/A 12/28/2016   Procedure: Left Heart Cath and Coronary Angiography;  Surgeon: Larey Dresser, MD;  Location: [place] [facility];  Service: Cardiovascular;  Laterality: N/A;  . LEFT HEART CATHETERIZATION WITH CORONARY ANGIOGRAM N/A 12/19/2011   Procedure: LEFT HEART CATHETERIZATION WITH CORONARY ANGIOGRAM;  Surgeon: Larey Dresser, MD;  Location: [facility] CATH LAB;  Service: Cardiovascular;  Laterality: N/A;  . OSTEOCHONDROMA EXCISION Left 1972   "took bone tumor off my shoulder"    Family History  Problem Relation Age of Onset  . Diabetes Mother   . Hypertension Mother   . Heart attack Neg Hx   . Stroke Neg Hx    Allergies  Allergen Reactions  . Shrimp [Shellfish Allergy] Nausea And Vomiting  . Ace Inhibitors Cough  . Other Hives and Other (See Comments)    Patient reports developing hives after receiving "some antibiotic given in 1980''s at [facility]". He does not know which antibiotic.     Current Outpatient Medications:  .  ACCU-CHEK SOFTCLIX LANCETS lancets, Use as instructed for three times daily testing of blood glucose, Disp: 100 each, Rfl: 12 .  amLODipine (NORVASC) 5 MG tablet, Take 1 tablet (5 mg total) by mouth daily., Disp: 90 tablet, Rfl: 1 .  atorvastatin (LIPITOR) 80 MG tablet, Take 1 tablet (80 mg total) by mouth daily., Disp: 90 tablet, Rfl: 1 .  Blood Glucose Monitoring Suppl (ACCU-CHEK AVIVA PLUS) w/Device KIT, Use as directed for 3 times daily testing of blood glucose, Disp:  1 kit, Rfl: 0 .  carvedilol (COREG) 25 MG tablet, Take 1 tablet (25 mg total) by mouth 2 (two) times daily with a meal., Disp: 180 tablet, Rfl: 1 .  chlorpheniramine-HYDROcodone (TUSSIONEX PENNKINETIC ER) 10-8 MG/5ML SUER, Take 5 mLs by mouth every 12 (twelve) hours as needed for cough., Disp: 140 mL, Rfl: 0 .  cyclobenzaprine (FLEXERIL) 10 MG tablet, Take 1 tablet (10 mg total) by mouth at bedtime., Disp: 30 tablet, Rfl: 1 .  furosemide (LASIX) 20 MG tablet, TAKE TWO TABLETS BY MOUTH IN THE MORNING, THEN TAKE ONE IN THE EVENING (Patient taking differently: Take 20-40 mg by mouth See admin instructions. Take 2 tablets (40 mg) by mouth every morning and 1 tablet (20 mg) in the evening), Disp: 90 tablet, Rfl: 6 .  gabapentin (NEURONTIN) 300 MG capsule, Take 1 capsule (300 mg total) by mouth at bedtime., Disp: 30 capsule, Rfl: 1 .  glucose blood (ACCU-CHEK AVIVA PLUS) test strip, Use  as instructed for 3 times daily testing of blood glucose, Disp: 100 each, Rfl: 12 .  hydrALAZINE (APRESOLINE) 50 MG tablet, Take 1.5 tablets (75 mg total) by mouth 3 (three) times daily., Disp: 405 tablet, Rfl: 0 .  Insulin Glargine (LANTUS SOLOSTAR) 100 UNIT/ML Solostar Pen, Inject 35 Units into the skin every morning. And pen needles 1/day (Patient taking differently: Inject 35 Units into the skin daily. And pen needles 1/day), Disp: 30 mL, Rfl: 6 .  isosorbide mononitrate (IMDUR) 30 MG 24 hr tablet, Take 1 tablet (30 mg total) by mouth daily., Disp: 30 tablet, Rfl: 6 .  Lancet Devices (ACCU-CHEK SOFTCLIX) lancets, Use as instructed for 3 times daily testing of blood glucose, Disp: 1 each, Rfl: 0 .  metFORMIN (GLUCOPHAGE) 1000 MG tablet, Take 1 tablet (1,000 mg total) by mouth 2 (two) times daily with a meal., Disp: 180 tablet, Rfl: 1 .  nitroGLYCERIN (NITROSTAT) 0.4 MG SL tablet, Place 1 tablet (0.4 mg total) under the tongue every 5 (five) minutes as needed for chest pain (x 3 tabs daily)., Disp: 60 tablet, Rfl: 1 .   ondansetron (ZOFRAN-ODT) 4 MG disintegrating tablet, Take 1 tablet (4 mg total) by mouth every 8 (eight) hours as needed for nausea or vomiting., Disp: 12 tablet, Rfl: 0 .  rivaroxaban (XARELTO) 20 MG TABS tablet, Take 1 tablet (20 mg total) by mouth daily with supper., Disp: 30 tablet, Rfl: 6 .  sotalol (BETAPACE) 120 MG tablet, TAKE ONE TABLET BY MOUTH EVERY 12 HOURS (Patient taking differently: Take 120 mg by mouth every 12 (twelve) hours. ), Disp: 180 tablet, Rfl: 2 .  TRUEPLUS LANCETS 28G MISC, 1 each by Does not apply route 3 (three) times daily., Disp: 100 each, Rfl: 12  EXAM:  VITALS per patient if applicable: RR between 09-23 bpm  GENERAL: alert, oriented, appears well and in no acute distress  HEENT: atraumatic, conjunctiva clear, no obvious abnormalities on inspection of external nose and ears  NECK: normal movements of the head and neck  LUNGS: on inspection no signs of respiratory distress, breathing rate appears normal, no obvious gross SOB, gasping or wheezing  CV: no obvious cyanosis  MS: moves all visible extremities without noticeable abnormality  PSYCH/NEURO: pleasant and cooperative, no obvious depression or anxiety, speech and thought processing grossly intact  ASSESSMENT AND PLAN:  Discussed the following assessment and plan:  Pyelonephritis  -improving, still with low back pain -s/p course of Cipro -can take Tylenol prn, increase po intake of fluids, heat, massage. -given precautions.  Monitor for return of symptoms - Plan: CBC (no diff), Comprehensive metabolic panel, Urinalysis  Uncontrolled type 2 diabetes mellitus with hyperglycemia ([place])  - Plan: Hemoglobin A1c -pt encouraged to obtain a glucometer.  Consider affordable options at [facility].   -discussed lifestyle modifications -continue current meds: metformin 1000 mg BID and lantus  Essential hypertension -continue current meds: hydralazine 75 mg TID, coreg 25 mg BID, norvasc 5 mg -pt encouraged  to check bp daily -discussed lifestyle modifications  Chronic diastolic CHF (congestive heart failure) (HCC) -discussed sodium and fluid restrictions -continue current meds: Imdur, lasix, coreg 25 mg BID -pt advised to schedule f/u with Cardiology  Atrial flutter with controlled response (HCC)  -stable -continue xarelto  Alcohol use  -may drink 1 pint per day.  No EtOH since dx'd with pyelo. -discussed cutting down.  Pt willing to make changes  Establish Care -We reviewed the PMH, PSH, FH, SH, Meds and Allergies. -We provided refills for any  medications we will prescribe as needed. -We addressed current concerns per orders and patient instructions. -We have asked for records for pertinent exams, studies, vaccines and notes from previous providers. -We have advised patient to follow up per instructions below.  F/u in 1 month   I discussed the assessment and treatment plan with the patient. The patient was provided an opportunity to ask questions and all were answered. The patient agreed with the plan and demonstrated an understanding of the instructions.   The patient was advised to call back or seek an in-person evaluation if the symptoms worsen or if the condition fails to improve as anticipated.  [name], MD

## 2019-04-03 ENCOUNTER — Other Ambulatory Visit: Payer: Self-pay | Admitting: Family Medicine

## 2019-04-03 DIAGNOSIS — I4819 Other persistent atrial fibrillation: Secondary | ICD-10-CM

## 2019-04-04 ENCOUNTER — Other Ambulatory Visit: Payer: Self-pay | Admitting: Family Medicine

## 2019-04-04 DIAGNOSIS — I4819 Other persistent atrial fibrillation: Secondary | ICD-10-CM

## 2019-04-09 ENCOUNTER — Telehealth: Payer: Self-pay | Admitting: Family Medicine

## 2019-04-09 NOTE — Telephone Encounter (Signed)
-----   Message from Carilyn Goodpasture, RN sent at 04/03/2019 12:57 PM EDT ----- Regarding: OV Please try to schedule an appointment so he can get additional RF on Rx- September

## 2019-04-09 NOTE — Telephone Encounter (Signed)
Attempted to reach patient no answer LVM

## 2019-04-23 ENCOUNTER — Encounter: Payer: Self-pay | Admitting: Family Medicine

## 2019-04-23 ENCOUNTER — Ambulatory Visit (INDEPENDENT_AMBULATORY_CARE_PROVIDER_SITE_OTHER): Payer: Medicare Other | Admitting: Family Medicine

## 2019-04-23 ENCOUNTER — Other Ambulatory Visit: Payer: Self-pay

## 2019-04-23 VITALS — BP 120/60 | HR 84 | Temp 97.0°F | Wt 197.0 lb

## 2019-04-23 DIAGNOSIS — Z7289 Other problems related to lifestyle: Secondary | ICD-10-CM | POA: Diagnosis not present

## 2019-04-23 DIAGNOSIS — E118 Type 2 diabetes mellitus with unspecified complications: Secondary | ICD-10-CM

## 2019-04-23 DIAGNOSIS — Z23 Encounter for immunization: Secondary | ICD-10-CM

## 2019-04-23 DIAGNOSIS — I5032 Chronic diastolic (congestive) heart failure: Secondary | ICD-10-CM | POA: Diagnosis not present

## 2019-04-23 DIAGNOSIS — I10 Essential (primary) hypertension: Secondary | ICD-10-CM | POA: Diagnosis not present

## 2019-04-23 DIAGNOSIS — I4891 Unspecified atrial fibrillation: Secondary | ICD-10-CM

## 2019-04-23 DIAGNOSIS — Z794 Long term (current) use of insulin: Secondary | ICD-10-CM

## 2019-04-23 DIAGNOSIS — Z789 Other specified health status: Secondary | ICD-10-CM

## 2019-04-23 NOTE — Progress Notes (Signed)
Subjective:    Patient ID: [name] [name], male    DOB: 31-Aug-1955, 63 y.o.   MRN: [id number]  No chief complaint on file.   HPI Patient was seen today for f/u.  DM II: -fsbs this am 185 -pt taking Metformin 1000 mg and lantus 38 units -pt states eating a lot of fast food  HTN, Afib, CHF: -not checking at home -eating out for most meals -endorses compliance with meds: hydralazine, imdur, sotalol, xarelto, norvasc, lasix 20 mg -notes ~20 lb wt loss since being sick with pyelonephritis  EtOH use -states drinking less -had a bottle of wine last night -states will be drinking tonight as football will be on TV. -denies tremors, eye opener  Past Medical History:  Diagnosis Date  . Anginal pain ([place])   . Anxiety   . Arrhythmia   . CAD (coronary artery disease), native coronary artery    a. Nonobstructive CAD by cath 2013 - diffuse distal and branch vessel CAD, no severe disease in the major coronaries, LV mild global hypokinesis, EF 45%. b. ETT-Sestamibi 5/14: EF 31%, small fixed inferior defect with no ischemia.  . Cholecystitis   . Chronic CHF ([place])    a. Mixed ICM/NICM (?EtOH). EF 35% in 2008. Echo 5/13: EF 60-65%, mod LVH, EF 45% on V gram in 12/2011. EF 12/2012: EF 50-55%, mild LVH, inferobasal HK, mild MR. ETT-Ses 5/14 EF 41%. Cardiac MRI 5/14: EF 44%, mild global HK, subepicardial delayed enhancement in nonspecific RV insertion pattern.  . COLONIC POLYPS, HX OF 12/30/2006  . Gout   . H/O atrial flutter 2007   a. Ablations in 2007, 2008.  [name] Heart murmur   . HEPATITIS B, CHRONIC 12/30/2006  . History of alcohol abuse   . History of hiatal hernia   . History of medication noncompliance   . History of nuclear stress test    Myoview 3/17: + chest pain; EF 33%, downsloping ST depression 2, V4-6, LVH with strain, no ischemia on images; intermediate risk   . HYPERCHOLESTEROLEMIA 07/11/2010  . Hypertension   . Left sciatic nerve pain since 04/2015  . LIVER FUNCTION TESTS,  ABNORMAL, HX OF 12/30/2006  . MITRAL REGURGITATION 12/30/2006  . 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 PAF (paroxysmal atrial fibrillation) (HCC)    On coumadin  . Rectal bleeding 12/18/2011   Scheduled for colonoscopy.    . Shortness of breath dyspnea    with excertion  . Sleep apnea    "suppose to send mask but they never did" (05/03/2015)  . Type II diabetes mellitus (HCC)     Allergies  Allergen Reactions  . Shrimp [Shellfish Allergy] Nausea And Vomiting  . Ace Inhibitors Cough  . Other Hives and Other (See Comments)    Patient reports developing hives after receiving "some antibiotic given in 1980''s at [facility]". He does not know which antibiotic.    ROS General: Denies fever, chills, night sweats, changes in weight, changes in appetite  +weight loss HEENT: Denies headaches, ear pain, changes in vision, rhinorrhea, sore throat CV: Denies CP, palpitations, SOB, orthopnea Pulm: Denies SOB, cough, wheezing GI: Denies abdominal pain, nausea, vomiting, diarrhea, constipation GU: Denies dysuria, hematuria, frequency, vaginal discharge Msk: Denies muscle cramps, joint pains Neuro: Denies weakness, numbness, tingling Skin: Denies rashes, bruising Psych: Denies depression, anxiety, hallucinations     Objective:    Blood pressure 120/60, pulse 84, temperature (!) 97 F (36.1 C), weight 197 lb (89.4 kg), SpO2 98 %.  Gen. Pleasant, well-nourished, in no distress, normal affect  HEENT: [place]/AT, face symmetric, no scleral icterus, PERRLA, nares patent without drainage, pharynx without erythema or exudate. Lungs: no accessory muscle use, CTAB, no wheezes or rales Cardiovascular: RRR, no m/r/g, no peripheral edema Abdomen: BS present, soft, NT/ND Musculoskeletal: No deformities, no cyanosis or clubbing, normal tone Neuro:  A&Ox3, CN II-XII intact, normal gait Skin:  Warm, no lesions/ rash   Wt Readings from Last 3 Encounters:  10/19/18 207 lb 3.2 oz (94  kg)  09/29/18 207 lb 3.2 oz (94 kg)  08/15/18 211 lb (95.7 kg)    Lab Results  Component Value Date   WBC 8.8 03/23/2019   HGB 11.5 (L) 03/23/2019   HCT 34.9 (L) 03/23/2019   PLT 354.0 03/23/2019   GLUCOSE 94 03/23/2019   CHOL 139 04/08/2018   TRIG 112 04/08/2018   HDL 49 04/08/2018   LDLDIRECT 111.0 04/22/2015   LDLCALC 68 04/08/2018   ALT 33 03/23/2019   AST 28 03/23/2019   NA 137 03/23/2019   K 4.1 03/23/2019   CL 102 03/23/2019   CREATININE 1.06 03/23/2019   BUN 12 03/23/2019   CO2 26 03/23/2019   TSH 0.553 11/11/2013   PSA 0.91 03/30/2010   INR 2.86 05/27/2018   HGBA1C 6.8 (H) 03/23/2019   MICROALBUR 2.5 (H) 01/26/2016    Assessment/Plan:  Essential hypertension -controlled this visit -continue current meds -lifestyle modifications advised -pt to check bp at home  Controlled type 2 diabetes mellitus with complication, with long-term current use of insulin ([place]) -continue metformin 1000 mg BID and lantus 38 units -lifestyle modifications encouraged -last hgb A1c 6.8% -continue checking fsbs at home -given handouts  Alcohol use -pt advised to stop drinking -discussed cutting down on number of alcoholic beverages consumed daily. -given precautions  Chronic diastolic congestive heart failure ([place]) -discussed daily wts -decrease sodium intake -continue current meds -f/u with Cardiology advised  Atrial fibrillation, unspecified type ([place]) -stable -continue current meds -f/u with Cardiology  Need for immunization against influenza  - Plan: Flu Vaccine QUAD High Dose(Fluad)  F/u in 1 month  Grier Mitts, MD

## 2019-04-23 NOTE — Patient Instructions (Signed)
Type 2 Diabetes Mellitus, Self Care, Adult When you have type 2 diabetes (type 2 diabetes mellitus), you must make sure your blood sugar (glucose) stays in a healthy range. You can do this with:  Nutrition.  Exercise.  Lifestyle changes.  Medicines or insulin, if needed.  Support from your doctors and others. How to stay aware of blood sugar   Check your blood sugar level every day, as often as told.  Have your A1c (hemoglobin A1c) level checked two or more times a year. Have it checked more often if your doctor tells you to. Your doctor will set personal treatment goals for you. Generally, you should have these blood sugar levels:  Before meals (preprandial): 80-130 mg/dL (4.4-7.2 mmol/L).  After meals (postprandial): below 180 mg/dL (10 mmol/L).  A1c level: less than 7%. How to manage high and low blood sugar Signs of high blood sugar High blood sugar is called hyperglycemia. Know the signs of high blood sugar. Signs may include:  Feeling: ? Thirsty. ? Hungry. ? Very tired.  Needing to pee (urinate) more than usual.  Blurry vision. Signs of low blood sugar Low blood sugar is called hypoglycemia. This is when blood sugar is at or below 70 mg/dL (3.9 mmol/L). Signs may include:  Feeling: ? Hungry. ? Worried or nervous (anxious). ? Sweaty and clammy. ? Confused. ? Dizzy. ? Sleepy. ? Sick to your stomach (nauseous).  Having: ? A fast heartbeat. ? A headache. ? A change in your vision. ? Jerky movements that you cannot control (seizure). ? Tingling or no feeling (numbness) around your mouth, lips, or tongue.  Having trouble with: ? Moving (coordination). ? Sleeping. ? Passing out (fainting). ? Getting upset easily (irritability). Treating low blood sugar To treat low blood sugar, eat or drink something sugary right away. If you can think clearly and swallow safely, follow the 15:15 rule:  Take 15 grams of a fast-acting carb (carbohydrate). Talk with your  doctor about how much you should take.  Some fast-acting carbs are: ? Sugar tablets (glucose pills). Take 3-4 pills. ? 6-8 pieces of hard candy. ? 4-6 oz (120-150 mL) of fruit juice. ? 4-6 oz (120-150 mL) of regular (not diet) soda. ? 1 Tbsp (15 mL) honey or sugar.  Check your blood sugar 15 minutes after you take the carb.  If your blood sugar is still at or below 70 mg/dL (3.9 mmol/L), take 15 grams of a carb again.  If your blood sugar does not go above 70 mg/dL (3.9 mmol/L) after 3 tries, get help right away.  After your blood sugar goes back to normal, eat a meal or a snack within 1 hour. Treating very low blood sugar If your blood sugar is at or below 54 mg/dL (3 mmol/L), you have very low blood sugar (severe hypoglycemia). This is an emergency. Do not wait to see if the symptoms will go away. Get medical help right away. Call your local emergency services (911 in the [place] If you have very low blood sugar and you cannot eat or drink, you may need a glucagon shot (injection). A family member or friend should learn how to check your blood sugar and how to give you a glucagon shot. Ask your doctor if you need to have a glucagon shot kit at home. Follow these instructions at home: Medicine  Take insulin and diabetes medicines as told.  If your doctor says you should take more or less insulin and medicines, do this exactly as told.    Do not run out of insulin or medicines. Having diabetes can raise your risk for other long-term conditions. These include heart disease and kidney disease. Your doctor may prescribe medicines to help you not have these problems. Food   Make healthy food choices. These include: ? Chicken, fish, egg whites, and beans. ? Oats, whole wheat, bulgur, brown rice, quinoa, and millet. ? Fresh fruits and vegetables. ? Low-fat dairy products. ? Nuts, avocado, olive oil, and canola oil.  Meet with a food specialist (dietitian). He or she can help you make an  eating plan that is right for you.  Follow instructions from your doctor about what you cannot eat or drink.  Drink enough fluid to keep your pee (urine) pale yellow.  Keep track of carbs that you eat. Do this by reading food labels and learning food serving sizes.  Follow your sick day plan when you cannot eat or drink normally. Make this plan with your doctor so it is ready to use. Activity  Exercise 3 or more times a week.  Do not go more than 2 days without exercising.  Talk with your doctor before you start a new exercise. Your doctor may need to tell you to change: ? How much insulin or medicines you take. ? How much food you eat. Lifestyle  Do not use any tobacco products. These include cigarettes, chewing tobacco, and e-cigarettes. If you need help quitting, ask your doctor.  Ask your doctor how much alcohol is safe for you.  Learn to deal with stress. If you need help with this, ask your doctor. Body care   Stay up to date with your shots (immunizations).  Have your eyes and feet checked by a doctor as often as told.  Check your skin and feet every day. Check for cuts, bruises, redness, blisters, or sores.  Brush your teeth and gums two times a day. Floss one or more times a day.  Go to the dentist one or more times every 6 months.  Stay at a healthy weight. General instructions  Take over-the-counter and prescription medicines only as told by your doctor.  Share your diabetes care plan with: ? Your work or school. ? People you live with.  Carry a card or wear jewelry that says you have diabetes.  Keep all follow-up visits as told by your doctor. This is important. Questions to ask your doctor  Do I need to meet with a diabetes educator?  Where can I find a support group for people with diabetes? Where to find more information To learn more about diabetes, visit:  American Diabetes Association: www.diabetes.org  American Association of Diabetes  Educators: www.diabeteseducator.org Summary  When you have type 2 diabetes, you must make sure your blood sugar (glucose) stays in a healthy range.  Check your blood sugar every day, as often as told.  Having diabetes can raise your risk for other conditions. Your doctor may prescribe medicines to help you not have these problems.  Keep all follow-up visits as told by your doctor. This is important. This information is not intended to replace advice given to you by your health care provider. Make sure you discuss any questions you have with your health care provider. Document Released: 11/21/2015 Document Revised: 01/20/2018 Document Reviewed: 09/02/2015 Elsevier Patient Education  2020 [place].  Preventing Diabetes Mellitus Complications You can take action to prevent or slow down problems that are caused by diabetes (diabetes mellitus). Following your diabetes plan and taking care of yourself  can reduce your risk of serious or life-threatening complications. What actions can I take to prevent diabetes complications? Manage your diabetes   Follow instructions from your health care providers about managing your diabetes. Your diabetes may be managed by a team of health care providers who can teach you how to care for yourself and can answer questions that you have.  Educate yourself about your condition so you can make healthy choices about eating and physical activity.  Check your blood sugar (glucose) levels as often as directed. Your health care provider will help you decide how often to check your blood glucose level depending on your treatment goals and how well you are meeting them.  Ask your health care provider if you should take low-dose aspirin daily and what dose is recommended for you. Taking low-dose aspirin daily is recommended to help prevent cardiovascular disease. Do not use nicotine or tobacco Do not use any products that contain nicotine or tobacco, such as  cigarettes and e-cigarettes. If you need help quitting, ask your health care provider. Nicotine raises your risk for diabetes problems. If you quit using nicotine:  You will lower your risk for heart attack, stroke, nerve disease, and kidney disease.  Your cholesterol and blood pressure may improve.  Your blood circulation will improve. Keep your blood pressure under control Your personal target blood pressure is determined based on:  Your age.  Your medicines.  How long you have had diabetes.  Any other medical conditions you have. To control your blood pressure:  Follow instructions from your health care provider about meal planning, exercise, and medicines.  Make sure your health care provider checks your blood pressure at every medical visit.  Monitor your blood pressure at home as told by your health care provider.  Keep your cholesterol under control To control your cholesterol:  Follow instructions from your health care provider about meal planning, exercise, and medicines.  Have your cholesterol checked at least once a year.  You may be prescribed medicine to lower cholesterol (statin). If you are not taking a statin, ask your health care provider if you should be. Controlling your cholesterol may:  Help prevent heart disease and stroke. These are the most common health problems for people with diabetes.  Improve your blood flow. Schedule and keep yearly physical exams and eye exams Your health care provider will tell you how often you need medical visits depending on your diabetes management plan. Keep all follow-up visits as directed. This is important so possible problems can be identified early and complications can be avoided or treated.  Every visit with your health care provider should include measuring your: ? Weight. ? Blood pressure. ? Blood glucose control.  Your A1c (hemoglobin A1c) level should be checked: ? At least 2 times a year, if you are  meeting your treatment goals. ? 4 times a year, if you are not meeting treatment goals or if your treatment goals have changed.  Your blood lipids (lipid profile) should be checked yearly. You should also be checked yearly for protein in your urine (urine microalbumin).  If you have type 1 diabetes, get an eye exam 3-5 years after you are diagnosed, and then once a year after your first exam.  If you have type 2 diabetes, get an eye exam as soon as you are diagnosed, and then once a year after your first exam. Keep your vaccines current It is recommended that you receive:  A flu (influenza) vaccine every year.  A pneumonia (pneumococcal) vaccine and a hepatitis B vaccine. If you are age 54 or older, you may get the pneumonia vaccine as a series of two separate shots. Ask your health care provider which other vaccines may be recommended. Take care of your feet Diabetes may cause you to have poor blood circulation to your legs and feet. Because of this, taking care of your feet is very important. Diabetes can cause:  The skin on the feet to get thinner, break more easily, and heal more slowly.  Nerve damage in your legs and feet, which results in decreased feeling. You may not notice minor injuries that could lead to serious problems. To avoid foot problems:  Check your skin and feet every day for cuts, bruises, redness, blisters, or sores.  Schedule a foot exam with your health care provider once every year. This exam includes: ? Inspecting of the structure and skin of your feet. ? Checking the pulses and sensation in your feet.  Make sure that your health care provider performs a visual foot exam at every medical visit.  Take care of your teeth People with poorly controlled diabetes are more likely to have gum (periodontal) disease. Diabetes can make periodontal diseases harder to control. If not treated, periodontal diseases can lead to tooth loss. To prevent this:  Brush your  teeth twice a day.  Floss at least once a day.  Visit your dentist 2 times a year. Drink responsibly Limit alcohol intake to no more than 1 drink a day for nonpregnant women and 2 drinks a day for men. One drink equals 12 oz of beer, 5 oz of wine, or 1 oz of hard liquor.  It is important to eat food when you drink alcohol to avoid low blood glucose (hypoglycemia). Avoid alcohol if you:  Have a history of alcohol abuse or dependence.  Are pregnant.  Have liver disease, pancreatitis, advanced neuropathy, or severe hypertriglyceridemia. Lessen stress Living with diabetes can be stressful. When you are experiencing stress, your blood glucose may be affected in two ways:  Stress hormones may cause your blood glucose to rise.  You may be distracted from taking good care of yourself. Be aware of your stress level and make changes to help you manage challenging situations. To lower your stress levels:  Consider joining a support group.  Do planned relaxation or meditation.  Do a hobby that you enjoy.  Maintain healthy relationships.  Exercise regularly.  Work with your health care provider or a mental health professional. Summary  You can take action to prevent or slow down problems that are caused by diabetes (diabetes mellitus). Following your diabetes plan and taking care of yourself can reduce your risk of serious or life-threatening complications.  Follow instructions from your health care providers about managing your diabetes. Your diabetes may be managed by a team of health care providers who can teach you how to care for yourself and can answer questions that you have.  Your health care provider will tell you how often you need medical visits depending on your diabetes management plan. Keep all follow-up visits as directed. This is important so possible problems can be identified early and complications can be avoided or treated. This information is not intended to replace  advice given to you by your health care provider. Make sure you discuss any questions you have with your health care provider. Document Released: 04/17/2011 Document Revised: 10/28/2017 Document Reviewed: 04/28/2016 Elsevier Patient Education  2020 [place]  Your Hypertension Hypertension is commonly called high blood pressure. This is when the force of your blood pressing against the walls of your arteries is too strong. Arteries are blood vessels that carry blood from your heart throughout your body. Hypertension forces the heart to work harder to pump blood, and may cause the arteries to become narrow or stiff. Having untreated or uncontrolled hypertension can cause heart attack, stroke, kidney disease, and other problems. What are blood pressure readings? A blood pressure reading consists of a higher number over a lower number. Ideally, your blood pressure should be below 120/80. The first ("top") number is called the systolic pressure. It is a measure of the pressure in your arteries as your heart beats. The second ("bottom") number is called the diastolic pressure. It is a measure of the pressure in your arteries as the heart relaxes. What does my blood pressure reading mean? Blood pressure is classified into four stages. Based on your blood pressure reading, your health care provider may use the following stages to determine what type of treatment you need, if any. Systolic pressure and diastolic pressure are measured in a unit called mm Hg. Normal  Systolic pressure: below 062.  Diastolic pressure: below 80. Elevated  Systolic pressure: 376-283.  Diastolic pressure: below 80. Hypertension stage 1  Systolic pressure: 151-761.  Diastolic pressure: 60-73. Hypertension stage 2  Systolic pressure: 710 or above.  Diastolic pressure: 90 or above. What health risks are associated with hypertension? Managing your hypertension is an important responsibility. Uncontrolled  hypertension can lead to:  A heart attack.  A stroke.  A weakened blood vessel (aneurysm).  Heart failure.  Kidney damage.  Eye damage.  Metabolic syndrome.  Memory and concentration problems. What changes can I make to manage my hypertension? Hypertension can be managed by making lifestyle changes and possibly by taking medicines. Your health care provider will help you make a plan to bring your blood pressure within a normal range. Eating and drinking   Eat a diet that is high in fiber and potassium, and low in salt (sodium), added sugar, and fat. An example eating plan is called the DASH (Dietary Approaches to Stop Hypertension) diet. To eat this way: ? Eat plenty of fresh fruits and vegetables. Try to fill half of your plate at each meal with fruits and vegetables. ? Eat whole grains, such as whole wheat pasta, brown rice, or whole grain bread. Fill about one quarter of your plate with whole grains. ? Eat low-fat diary products. ? Avoid fatty cuts of meat, processed or cured meats, and poultry with skin. Fill about one quarter of your plate with lean proteins such as fish, chicken without skin, beans, eggs, and tofu. ? Avoid premade and processed foods. These tend to be higher in sodium, added sugar, and fat.  Reduce your daily sodium intake. Most people with hypertension should eat less than 1,500 mg of sodium a day.  Limit alcohol intake to no more than 1 drink a day for nonpregnant women and 2 drinks a day for men. One drink equals 12 oz of beer, 5 oz of wine, or 1 oz of hard liquor. Lifestyle  Work with your health care provider to maintain a healthy body weight, or to lose weight. Ask what an ideal weight is for you.  Get at least 30 minutes of exercise that causes your heart to beat faster (aerobic exercise) most days of the week. Activities may include walking, swimming, or biking.  Include exercise to  strengthen your muscles (resistance exercise), such as weight  lifting, as part of your weekly exercise routine. Try to do these types of exercises for 30 minutes at least 3 days a week.  Do not use any products that contain nicotine or tobacco, such as cigarettes and e-cigarettes. If you need help quitting, ask your health care provider.  Control any long-term (chronic) conditions you have, such as high cholesterol or diabetes. Monitoring  Monitor your blood pressure at home as told by your health care provider. Your personal target blood pressure may vary depending on your medical conditions, your age, and other factors.  Have your blood pressure checked regularly, as often as told by your health care provider. Working with your health care provider  Review all the medicines you take with your health care provider because there may be side effects or interactions.  Talk with your health care provider about your diet, exercise habits, and other lifestyle factors that may be contributing to hypertension.  Visit your health care provider regularly. Your health care provider can help you create and adjust your plan for managing hypertension. Will I need medicine to control my blood pressure? Your health care provider may prescribe medicine if lifestyle changes are not enough to get your blood pressure under control, and if:  Your systolic blood pressure is 130 or higher.  Your diastolic blood pressure is 80 or higher. Take medicines only as told by your health care provider. Follow the directions carefully. Blood pressure medicines must be taken as prescribed. The medicine does not work as well when you skip doses. Skipping doses also puts you at risk for problems. Contact a health care provider if:  You think you are having a reaction to medicines you have taken.  You have repeated (recurrent) headaches.  You feel dizzy.  You have swelling in your ankles.  You have trouble with your vision. Get help right away if:  You develop a severe  headache or confusion.  You have unusual weakness or numbness, or you feel faint.  You have severe pain in your chest or abdomen.  You vomit repeatedly.  You have trouble breathing. Summary  Hypertension is when the force of blood pumping through your arteries is too strong. If this condition is not controlled, it may put you at risk for serious complications.  Your personal target blood pressure may vary depending on your medical conditions, your age, and other factors. For most people, a normal blood pressure is less than 120/80.  Hypertension is managed by lifestyle changes, medicines, or both. Lifestyle changes include weight loss, eating a healthy, low-sodium diet, exercising more, and limiting alcohol. This information is not intended to replace advice given to you by your health care provider. Make sure you discuss any questions you have with your health care provider. Document Released: 04/23/2012 Document Revised: 11/21/2018 Document Reviewed: 06/27/2016 Elsevier Patient Education  2020 [place].  Heart Failure Eating Plan Heart failure, also called congestive heart failure, occurs when your heart does not pump blood well enough to meet your body's needs for oxygen-rich blood. Heart failure is a long-term (chronic) condition. Living with heart failure can be challenging. However, following your health care provider's instructions about a healthy lifestyle and working with a diet and nutrition specialist (dietitian) to choose the right foods may help to improve your symptoms. What are tips for following this plan? Reading food labels  Check food labels for the amount of sodium per serving. Choose foods that have less  than 140 mg (milligrams) of sodium in each serving.  Check food labels for the number of calories per serving. This is important if you need to limit your daily calorie intake to lose weight.  Check food labels for the serving size. If you eat more than one  serving, you will be eating more sodium and calories than what is listed on the label.  Look for foods that are labeled as "sodium-free," "very low sodium," or "low sodium." ? Foods labeled as "reduced sodium" or "lightly salted" may still have more sodium than what is recommended for you. Cooking  Avoid adding salt when cooking. Ask your health care provider or dietitian before using salt substitutes.  Season food with salt-free seasonings, spices, or herbs. Check the label of seasoning mixes to make sure they do not contain salt.  Cook with heart-healthy oils, such as olive, canola, soybean, or sunflower oil.  Do not fry foods. Cook foods using low-fat methods, such as baking, boiling, grilling, and broiling.  Limit unhealthy fats when cooking by: ? Removing the skin from poultry, such as chicken. ? Removing all visible fats from meats. ? Skimming the fat off from stews, soups, and gravies before serving them. Meal planning   Limit your intake of: ? Processed, canned, or pre-packaged foods. ? Foods that are high in trans fat, such as fried foods. ? Sweets, desserts, sugary drinks, and other foods with added sugar. ? Full-fat dairy products, such as whole milk.  Eat a balanced diet that includes: ? 4-5 servings of fruit each day and 4-5 servings of vegetables each day. At each meal, try to fill half of your plate with fruits and vegetables. ? Up to 6-8 servings of whole grains each day. ? Up to 2 servings of lean meat, poultry, or fish each day. One serving of meat is equal to 3 oz. This is about the same size as a deck of cards. ? 2 servings of low-fat dairy each day. ? Heart-healthy fats. Healthy fats called omega-3 fatty acids are found in foods such as flaxseed and cold-water fish like sardines, salmon, and mackerel.  Aim to eat 25-35 g (grams) of fiber a day. Foods that are high in fiber include apples, broccoli, carrots, beans, peas, and whole grains.  Do not add salt or  condiments that contain salt (such as soy sauce) to foods before eating.  When eating at a restaurant, ask that your food be prepared with less salt or no salt, if possible.  Try to eat 2 or more vegetarian meals each week.  Eat more home-cooked food and eat less restaurant, buffet, and fast food. General information  Do not eat more than 2,300 mg of salt (sodium) a day. The amount of sodium that is recommended for you may be lower, depending on your condition.  Maintain a healthy body weight as directed. Ask your health care provider what a healthy weight is for you. ? Check your weight every day. ? Work with your health care provider and dietitian to make a plan that is right for you to lose weight or maintain your current weight.  Limit how much fluid you drink. Ask your health care provider or dietitian how much fluid you can have each day.  Limit or avoid alcohol as told by your health care provider or dietitian. Recommended foods The items listed may not be a complete list. Talk with your dietitian about what dietary choices are best for you. Fruits All fresh, frozen, and canned  fruits. Dried fruits, such as raisins, prunes, and cranberries. Vegetables All fresh vegetables. Vegetables that are frozen without sauce or added salt. Low-sodium or sodium-free canned vegetables. Grains Bread with less than 80 mg of sodium per slice. Whole-wheat pasta, quinoa, and brown rice. Oats and oatmeal. Barley. [place]. Grits and cream of wheat. Whole-grain and whole-wheat cold cereal. Meats and other protein foods Lean cuts of meat. Skinless chicken and [place]. Fish with high omega-3 fatty acids, such as salmon, sardines, and other cold-water fishes. Eggs. Dried beans, peas, and edamame. Unsalted nuts and nut butters. Dairy Low-fat or nonfat (skim) milk and dried milk. Rice milk, soy milk, and almond milk. Low-fat or nonfat yogurt. Small amounts of reduced-sodium block cheese. Low-sodium cottage  cheese. Fats and oils Olive, canola, soybean, flaxseed, or sunflower oil. Avocado. Sweets and desserts Apple sauce. Granola bars. Sugar-free pudding and gelatin. Frozen fruit bars. Seasoning and other foods Fresh and dried herbs. Lemon or lime juice. Vinegar. Low-sodium ketchup. Salt-free marinades, salad dressings, sauces, and seasonings. The items listed above may not be a complete list of foods and beverages you can eat. Contact a dietitian for more information. Foods to avoid The items listed may not be a complete list. Talk with your dietitian about what dietary choices are best for you. Fruits Fruits that are dried with sodium-containing preservatives. Vegetables Canned vegetables. Frozen vegetables with sauce or seasonings. Creamed vegetables. [place] fries. Onion rings. Pickled vegetables and sauerkraut. Grains Bread with more than 80 mg of sodium per slice. Hot or cold cereal with more than 140 mg sodium per serving. Salted pretzels and crackers. Pre-packaged breadcrumbs. Bagels, croissants, and biscuits. Meats and other protein foods Ribs and chicken wings. Bacon, ham, pepperoni, bologna, salami, and packaged luncheon meats. Hot dogs, bratwurst, and sausage. Canned meat. Smoked meat and fish. Salted nuts and seeds. Dairy Whole milk, half-and-half, and cream. Buttermilk. Processed cheese, cheese spreads, and cheese curds. Regular cottage cheese. Feta cheese. Shredded cheese. String cheese. Fats and oils Butter, lard, shortening, ghee, and bacon fat. Canned and packaged gravies. Seasoning and other foods Onion salt, garlic salt, table salt, and sea salt. Marinades. Regular salad dressings. Relishes, pickles, and olives. Meat flavorings and tenderizers, and bouillon cubes. Horseradish, ketchup, and mustard. Worcestershire sauce. Teriyaki sauce, soy sauce (including reduced sodium). Hot sauce and Tabasco sauce. Steak sauce, fish sauce, oyster sauce, and cocktail sauce. Taco seasonings.  Barbecue sauce. Tartar sauce. The items listed above may not be a complete list of foods and beverages you should avoid. Contact a dietitian for more information. Summary  A heart failure eating plan includes changes that limit your intake of sodium and unhealthy fat, and it may help you lose weight or maintain a healthy weight. Your health care provider may also recommend limiting how much fluid you drink.  Most people with heart failure should eat no more than 2,300 mg of salt (sodium) a day. The amount of sodium that is recommended for you may be lower, depending on your condition.  Contact your health care provider or dietitian before making any major changes to your diet. This information is not intended to replace advice given to you by your health care provider. Make sure you discuss any questions you have with your health care provider. Document Released: 12/14/2016 Document Revised: 09/25/2018 Document Reviewed: 12/14/2016 Elsevier Patient Education  2020 [employer].  Alcohol Abuse and Nutrition Alcohol abuse is any pattern of alcohol consumption that harms your health, relationships, or work. Alcohol abuse can cause poor nutrition (  malnutrition or malnourishment) and a lack of nutrients (nutrient deficiencies), which can lead to more complications. Alcohol abuse brings malnutrition and nutrient deficiencies in two ways:  It causes your liver to work abnormally. This affects how your body divides (breaks down) and absorbs nutrients from food.  It causes you to eat poorly. Many people who abuse alcohol do not eat enough carbohydrates, protein, fat, vitamins, and minerals. Nutrients that are commonly lacking (deficient) in people who abuse alcohol include:  Vitamins. ? Vitamin A. This is needed for your vision, metabolism, and ability to fight off infections (immunity). ? B vitamins. These include folate, thiamine, and niacin. These are needed for new cell growth. ? Vitamin C. This  plays an important role in wound healing, immunity, and helping your body to absorb iron. ? Vitamin D. This is necessary for your body to absorb and use calcium. It is produced by your liver, but you can also get it from food and from sun exposure.  Minerals. ? Calcium. This is needed for healthy bones as well as heart and blood vessel (cardiovascular) function. ? Iron. This is important for blood, muscle, and nervous system functioning. ? Magnesium. This plays an important role in muscle and nerve function, and it helps to control blood sugar and blood pressure. ? Zinc. This is important for the normal functioning of your nervous system and digestive system (gastrointestinal tract). If you think that you have an alcohol dependency problem, or if it is hard to stop drinking because you feel sick or different when you do not use alcohol, talk with your health care provider or another health professional about where to get help. Nutrition is an essential factor in therapy for alcohol abuse. Your health care provider or diet and nutrition specialist (dietitian) will work with you to design a plan that can help to restore nutrients to your body and prevent the risk of complications. What is my plan? Your dietitian may develop a specific eating plan that is based on your condition and any other problems that you have. An eating plan will commonly include:  A balanced diet. ? Grains: 6-8 oz (170-227 g) a day. Examples of 1 oz of whole grains include 1 cup of whole-wheat cereal,  cup of brown rice, or 1 slice of whole-wheat bread. ? Vegetables: 2-3 cups a day. Examples of 1 cup of vegetables include 2 medium carrots, 1 large tomato, or 2 stalks of celery. ? Fruits: 1-2 cups a day. Examples of 1 cup of fruit include 1 large banana, 1 small apple, 8 large strawberries, or 1 large orange. ? Meat and other protein: 5-6 oz (142-170 g) a day.  A cut of meat or fish that is the size of a deck of cards is about  3-4 oz.  Foods that provide 1 oz of protein include 1 egg,  cup of nuts or seeds, or 1 tablespoon (16 g) of peanut butter. ? Dairy: 2-3 cups a day. Examples of 1 cup of dairy include 8 oz (230 mL) of milk, 8 oz (230 g) of yogurt, or 1 oz (44 g) of natural cheese.  Vitamin and mineral supplements. What are tips for following this plan?  Eat frequent meals and snacks. Try to eat 5-6 small meals each day.  Take vitamin or mineral supplements as recommended by your dietitian.  If you are malnourished or if your dietitian recommends it: ? You may follow a high-protein, high-calorie diet. This may include:  2,000-3,000 calories (kilocalories) a day.  70-100 g (grams) of protein a day. ? You may be directed to follow a diet that includes a complete nutritional supplement beverage. This can help to restore calories, protein, and vitamins to your body. Depending on your condition, you may be advised to consume this beverage instead of your meals or in addition to them.  Certain medicines may cause changes in your appetite, taste, and weight. Work with your health care provider and dietitian to make any changes to your medicines and eating plan.  If you are unable to take in enough food and calories by mouth, your health care provider may recommend a feeding tube. This tube delivers nutritional supplements directly to your stomach. Recommended foods  Eat foods that are high in molecules that prevent oxygen from reacting with your food (antioxidants). These foods include grapes, berries, nuts, green tea, and dark green or orange vegetables. Eating these can help to prevent some of the stress that is placed on your liver by consuming alcohol.  Eat a variety of fresh fruits and vegetables each day. This will help you to get fiber and vitamins in your diet.  Drink plenty of water and other clear fluids, such as apple juice and broth. Try to drink at least 48-64 oz (1.5-2 L) of water a  day.  Include foods fortified with vitamins and minerals in your diet. Commonly fortified foods include milk, orange juice, cereal, and bread.  Eat a variety of foods that are high in omega-3 and omega-6 fatty acids. These include fish, nuts and seeds, and soybeans. These foods may help your liver to recover and may also stabilize your mood.  If you are a vegetarian: ? Eat a variety of protein-rich foods. ? Pair whole grains with plant-based proteins at meals and snack time. For example, eat rice with beans, put peanut butter on whole-grain toast, or eat oatmeal with sunflower seeds. The items listed above may not be a complete list of foods and beverages you can eat. Contact a dietitian for more information. Foods to avoid  Avoid foods and drinks that are high in fat and sugar. Sugary drinks, salty snacks, and candy contain empty calories. This means that they lack important nutrients such as protein, fiber, and vitamins.  Avoid alcohol. This is the best way to avoid malnutrition due to alcohol abuse. If you must drink, drink measured amounts. Measured drinking means limiting your intake to no more than 1 drink a day for nonpregnant women and 2 drinks a day for men. One drink equals 12 oz (355 mL) of beer, 5 oz (148 mL) of wine, or 1 oz (44 mL) of hard liquor.  Limit your intake of caffeine. Replace drinks like coffee and black tea with decaffeinated coffee and decaffeinated herbal tea. The items listed above may not be a complete list of foods and beverages you should avoid. Contact a dietitian for more information. Summary  Alcohol abuse can cause poor nutrition (malnutrition or malnourishment) and a lack of nutrients (nutrient deficiencies), which can lead to more health problems.  Common nutrient deficiencies include vitamin deficiencies (A, B, C, and D) and mineral deficiencies (calcium, iron, magnesium, and zinc).  Nutrition is an essential factor in therapy for alcohol abuse.  Your  health care provider and dietitian can help you to develop a specific eating plan that includes a balanced diet plus vitamin and mineral supplements. This information is not intended to replace advice given to you by your health care provider. Make sure you discuss any questions  you have with your health care provider. Document Released: 05/24/2005 Document Revised: 11/18/2018 Document Reviewed: 04/16/2017 Elsevier Patient Education  2020 [employer].

## 2019-04-24 ENCOUNTER — Emergency Department (HOSPITAL_COMMUNITY): Payer: Medicare Other

## 2019-04-24 ENCOUNTER — Other Ambulatory Visit: Payer: Self-pay

## 2019-04-24 ENCOUNTER — Inpatient Hospital Stay (HOSPITAL_COMMUNITY)
Admission: EM | Admit: 2019-04-24 | Discharge: 2019-04-28 | DRG: 303 | Disposition: A | Discharge status: Home / Self Care | Payer: Medicare Other | Attending: Internal Medicine | Admitting: Internal Medicine

## 2019-04-24 ENCOUNTER — Encounter (HOSPITAL_COMMUNITY): Payer: Self-pay | Admitting: Emergency Medicine

## 2019-04-24 DIAGNOSIS — Z8679 Personal history of other diseases of the circulatory system: Secondary | ICD-10-CM | POA: Diagnosis not present

## 2019-04-24 DIAGNOSIS — K635 Polyp of colon: Secondary | ICD-10-CM | POA: Diagnosis present

## 2019-04-24 DIAGNOSIS — I25119 Atherosclerotic heart disease of native coronary artery with unspecified angina pectoris: Principal | ICD-10-CM | POA: Diagnosis present

## 2019-04-24 DIAGNOSIS — I25110 Atherosclerotic heart disease of native coronary artery with unstable angina pectoris: Secondary | ICD-10-CM

## 2019-04-24 DIAGNOSIS — Z91013 Allergy to seafood: Secondary | ICD-10-CM

## 2019-04-24 DIAGNOSIS — D649 Anemia, unspecified: Secondary | ICD-10-CM | POA: Diagnosis not present

## 2019-04-24 DIAGNOSIS — Z79899 Other long term (current) drug therapy: Secondary | ICD-10-CM

## 2019-04-24 DIAGNOSIS — K449 Diaphragmatic hernia without obstruction or gangrene: Secondary | ICD-10-CM | POA: Diagnosis present

## 2019-04-24 DIAGNOSIS — I110 Hypertensive heart disease with heart failure: Secondary | ICD-10-CM | POA: Diagnosis present

## 2019-04-24 DIAGNOSIS — Z794 Long term (current) use of insulin: Secondary | ICD-10-CM

## 2019-04-24 DIAGNOSIS — E7800 Pure hypercholesterolemia, unspecified: Secondary | ICD-10-CM | POA: Diagnosis present

## 2019-04-24 DIAGNOSIS — Z86010 Personal history of colonic polyps: Secondary | ICD-10-CM

## 2019-04-24 DIAGNOSIS — D500 Iron deficiency anemia secondary to blood loss (chronic): Secondary | ICD-10-CM | POA: Diagnosis not present

## 2019-04-24 DIAGNOSIS — F419 Anxiety disorder, unspecified: Secondary | ICD-10-CM | POA: Diagnosis present

## 2019-04-24 DIAGNOSIS — G4733 Obstructive sleep apnea (adult) (pediatric): Secondary | ICD-10-CM

## 2019-04-24 DIAGNOSIS — Z9861 Coronary angioplasty status: Secondary | ICD-10-CM

## 2019-04-24 DIAGNOSIS — R079 Chest pain, unspecified: Secondary | ICD-10-CM

## 2019-04-24 DIAGNOSIS — B181 Chronic viral hepatitis B without delta-agent: Secondary | ICD-10-CM | POA: Diagnosis present

## 2019-04-24 DIAGNOSIS — M109 Gout, unspecified: Secondary | ICD-10-CM | POA: Diagnosis present

## 2019-04-24 DIAGNOSIS — K5900 Constipation, unspecified: Secondary | ICD-10-CM | POA: Diagnosis present

## 2019-04-24 DIAGNOSIS — I10 Essential (primary) hypertension: Secondary | ICD-10-CM

## 2019-04-24 DIAGNOSIS — Z8639 Personal history of other endocrine, nutritional and metabolic disease: Secondary | ICD-10-CM

## 2019-04-24 DIAGNOSIS — I480 Paroxysmal atrial fibrillation: Secondary | ICD-10-CM | POA: Diagnosis present

## 2019-04-24 DIAGNOSIS — F1010 Alcohol abuse, uncomplicated: Secondary | ICD-10-CM | POA: Diagnosis present

## 2019-04-24 DIAGNOSIS — Z888 Allergy status to other drugs, medicaments and biological substances status: Secondary | ICD-10-CM

## 2019-04-24 DIAGNOSIS — Z9119 Patient's noncompliance with other medical treatment and regimen: Secondary | ICD-10-CM

## 2019-04-24 DIAGNOSIS — E119 Type 2 diabetes mellitus without complications: Secondary | ICD-10-CM | POA: Diagnosis not present

## 2019-04-24 DIAGNOSIS — G4730 Sleep apnea, unspecified: Secondary | ICD-10-CM | POA: Diagnosis present

## 2019-04-24 DIAGNOSIS — D509 Iron deficiency anemia, unspecified: Secondary | ICD-10-CM | POA: Diagnosis present

## 2019-04-24 DIAGNOSIS — Z20828 Contact with and (suspected) exposure to other viral communicable diseases: Secondary | ICD-10-CM | POA: Diagnosis present

## 2019-04-24 DIAGNOSIS — K648 Other hemorrhoids: Secondary | ICD-10-CM | POA: Diagnosis present

## 2019-04-24 DIAGNOSIS — Z87891 Personal history of nicotine dependence: Secondary | ICD-10-CM

## 2019-04-24 DIAGNOSIS — Z7901 Long term (current) use of anticoagulants: Secondary | ICD-10-CM

## 2019-04-24 DIAGNOSIS — I5032 Chronic diastolic (congestive) heart failure: Secondary | ICD-10-CM | POA: Diagnosis present

## 2019-04-24 DIAGNOSIS — K921 Melena: Secondary | ICD-10-CM | POA: Diagnosis present

## 2019-04-24 DIAGNOSIS — Z833 Family history of diabetes mellitus: Secondary | ICD-10-CM

## 2019-04-24 DIAGNOSIS — K5730 Diverticulosis of large intestine without perforation or abscess without bleeding: Secondary | ICD-10-CM | POA: Diagnosis present

## 2019-04-24 LAB — BASIC METABOLIC PANEL
Anion gap: 6 (ref 5–15)
BUN: 6 mg/dL — ABNORMAL LOW (ref 8–23)
CO2: 24 mmol/L (ref 22–32)
Calcium: 8.7 mg/dL — ABNORMAL LOW (ref 8.9–10.3)
Chloride: 108 mmol/L (ref 98–111)
Creatinine, Ser: 0.98 mg/dL (ref 0.61–1.24)
GFR calc Af Amer: >60 mL/min (ref >60)
GFR calc non Af Amer: >60 mL/min (ref >60)
Glucose, Bld: 124 mg/dL — ABNORMAL HIGH (ref 70–99)
Potassium: 3.5 mmol/L (ref 3.5–5.1)
Sodium: 138 mmol/L (ref 135–145)

## 2019-04-24 LAB — CBC
HCT: 28.1 % — ABNORMAL LOW (ref 39.0–52.0)
Hemoglobin: 8.4 g/dL — ABNORMAL LOW (ref 13.0–17.0)
MCH: 25.1 pg — ABNORMAL LOW (ref 26.0–34.0)
MCHC: 29.9 g/dL — ABNORMAL LOW (ref 30.0–36.0)
MCV: 84.1 fL (ref 80.0–100.0)
Platelets: 229 10*3/uL (ref 150–400)
RBC: 3.34 MIL/uL — ABNORMAL LOW (ref 4.22–5.81)
RDW: 15.5 % (ref 11.5–15.5)
WBC: 6.8 10*3/uL (ref 4.0–10.5)
nRBC: 0 % (ref 0.0–0.2)

## 2019-04-24 LAB — LACTATE DEHYDROGENASE: LDH: 124 U/L (ref 98–192)

## 2019-04-24 LAB — TYPE AND SCREEN
ABO/RH(D): A NEG
Antibody Screen: NEGATIVE

## 2019-04-24 LAB — GLUCOSE, CAPILLARY: Glucose-Capillary: 131 mg/dL — ABNORMAL HIGH (ref 70–99)

## 2019-04-24 LAB — TROPONIN I (HIGH SENSITIVITY)
Troponin I (High Sensitivity): 24 ng/L — ABNORMAL HIGH (ref <18)
Troponin I (High Sensitivity): 22 ng/L — ABNORMAL HIGH (ref <18)

## 2019-04-24 LAB — PROTIME-INR
INR: 1.2 (ref 0.8–1.2)
Prothrombin Time: 15.4 seconds — ABNORMAL HIGH (ref 11.4–15.2)

## 2019-04-24 MED ORDER — ACETAMINOPHEN 325 MG PO TABS: 650.0000 mg | ORAL_TABLET | ORAL | Status: DC | PRN

## 2019-04-24 MED ORDER — AMLODIPINE BESYLATE 5 MG PO TABS
5.0000 mg | ORAL_TABLET | Freq: Every day | ORAL | Status: DC
  Administered 2019-04-24 – 2019-04-28 (×5): 5 mg via ORAL
  Filled 2019-04-24 (×6): qty 1

## 2019-04-24 MED ORDER — ASPIRIN 81 MG PO CHEW
324.0000 mg | CHEWABLE_TABLET | Freq: Once | ORAL | Status: AC
  Administered 2019-04-24: 18:00:00 324 mg via ORAL
  Filled 2019-04-24: qty 4

## 2019-04-24 MED ORDER — ISOSORBIDE MONONITRATE ER 30 MG PO TB24
30.0000 mg | ORAL_TABLET | Freq: Every day | ORAL | Status: DC
  Administered 2019-04-24 – 2019-04-28 (×5): 30 mg via ORAL
  Filled 2019-04-24 (×5): qty 1

## 2019-04-24 MED ORDER — CARVEDILOL 25 MG PO TABS
25.0000 mg | ORAL_TABLET | Freq: Two times a day (BID) | ORAL | Status: DC
  Administered 2019-04-25 – 2019-04-28 (×7): 25 mg via ORAL
  Filled 2019-04-24 (×7): qty 1

## 2019-04-24 MED ORDER — SODIUM CHLORIDE 0.9% FLUSH
3.0000 mL | Freq: Once | INTRAVENOUS | Status: AC
  Administered 2019-04-24: 3 mL via INTRAVENOUS

## 2019-04-24 MED ORDER — FUROSEMIDE 20 MG PO TABS
20.0000 mg | ORAL_TABLET | Freq: Every day | ORAL | Status: DC
  Administered 2019-04-25 – 2019-04-27 (×3): 20 mg via ORAL
  Filled 2019-04-24 (×3): qty 1

## 2019-04-24 MED ORDER — ATORVASTATIN CALCIUM 80 MG PO TABS
80.0000 mg | ORAL_TABLET | Freq: Every day | ORAL | Status: DC
  Administered 2019-04-24 – 2019-04-28 (×5): 80 mg via ORAL
  Filled 2019-04-24 (×5): qty 1

## 2019-04-24 MED ORDER — SOTALOL HCL 80 MG PO TABS
120.0000 mg | ORAL_TABLET | Freq: Two times a day (BID) | ORAL | Status: DC
  Administered 2019-04-24 – 2019-04-28 (×8): 120 mg via ORAL
  Filled 2019-04-24 (×5): qty 1
  Filled 2019-04-24: qty 2
  Filled 2019-04-24: qty 1
  Filled 2019-04-24: qty 2

## 2019-04-24 MED ORDER — ENSURE ENLIVE PO LIQD
237.0000 mL | Freq: Two times a day (BID) | ORAL | Status: DC
  Administered 2019-04-25 – 2019-04-28 (×5): 237 mL via ORAL

## 2019-04-24 MED ORDER — HEPARIN (PORCINE) 25000 UT/250ML-% IV SOLN
1100.0000 [IU]/h | INTRAVENOUS | Status: DC
  Administered 2019-04-24: 18:00:00 1100 [IU]/h via INTRAVENOUS
  Filled 2019-04-24: qty 250

## 2019-04-24 MED ORDER — FUROSEMIDE 40 MG PO TABS
40.0000 mg | ORAL_TABLET | Freq: Every day | ORAL | Status: DC
  Administered 2019-04-25 – 2019-04-28 (×4): 40 mg via ORAL
  Filled 2019-04-24 (×4): qty 1

## 2019-04-24 MED ORDER — HYDRALAZINE HCL 50 MG PO TABS
75.0000 mg | ORAL_TABLET | Freq: Two times a day (BID) | ORAL | Status: DC
  Administered 2019-04-24 – 2019-04-28 (×8): 75 mg via ORAL
  Filled 2019-04-24 (×3): qty 1
  Filled 2019-04-24: qty 3
  Filled 2019-04-24 (×4): qty 1

## 2019-04-24 MED ORDER — ONDANSETRON HCL 4 MG/2ML IJ SOLN: 4.0000 mg | Freq: Four times a day (QID) | INTRAMUSCULAR | Status: DC | PRN

## 2019-04-24 MED ORDER — INSULIN GLARGINE 100 UNIT/ML ~~LOC~~ SOLN
20.0000 [IU] | Freq: Every day | SUBCUTANEOUS | Status: DC
  Administered 2019-04-25 – 2019-04-26 (×2): 20 [IU] via SUBCUTANEOUS
  Filled 2019-04-24 (×3): qty 0.2

## 2019-04-24 MED ORDER — FUROSEMIDE 20 MG PO TABS: 20.0000 mg | ORAL_TABLET | ORAL | Status: DC

## 2019-04-24 MED ORDER — INSULIN ASPART 100 UNIT/ML ~~LOC~~ SOLN: 0.0000 [IU] | Freq: Three times a day (TID) | SUBCUTANEOUS | Status: DC

## 2019-04-24 NOTE — Progress Notes (Signed)
ANTICOAGULATION CONSULT NOTE - Initial Consult  Pharmacy Consult for Heparin Indication: chest pain/ACS  Allergies  Allergen Reactions  . Shrimp [Shellfish Allergy] Nausea And Vomiting  . Ace Inhibitors Cough  . Other Hives and Other (See Comments)    Patient reports developing hives after receiving "some antibiotic given in 1980''s at [facility]". He does not know which antibiotic.    Patient Measurements: Height: 6' (182.9 cm) Weight: 197 lb (89.4 kg) IBW/kg (Calculated) : 77.6 Heparin Dosing Weight: 89.4 kg  Vital Signs: Temp: 98.3 F (36.8 C) (09/11 1523) Temp Source: Oral (09/11 1523) BP: 163/76 (09/11 1523) Pulse Rate: 72 (09/11 1523)  Labs: Recent Labs    04/24/19 1226  HGB 8.4*  HCT 28.1*  PLT 229  CREATININE 0.98  TROPONINIHS 24*    Estimated Creatinine Clearance: 82.5 mL/min (by C-G formula based on SCr of 0.98 mg/dL).   Medical History: Past Medical History:  Diagnosis Date  . Anginal pain ([place])   . Anxiety   . Arrhythmia   . CAD (coronary artery disease), native coronary artery    a. Nonobstructive CAD by cath 2013 - diffuse distal and branch vessel CAD, no severe disease in the major coronaries, LV mild global hypokinesis, EF 45%. b. ETT-Sestamibi 5/14: EF 31%, small fixed inferior defect with no ischemia.  . Cholecystitis   . Chronic CHF ([place])    a. Mixed ICM/NICM (?EtOH). EF 35% in 2008. Echo 5/13: EF 60-65%, mod LVH, EF 45% on V gram in 12/2011. EF 12/2012: EF 50-55%, mild LVH, inferobasal HK, mild MR. ETT-Ses 5/14 EF 41%. Cardiac MRI 5/14: EF 44%, mild global HK, subepicardial delayed enhancement in nonspecific RV insertion pattern.  . COLONIC POLYPS, HX OF 12/30/2006  . Gout   . H/O atrial flutter 2007   a. Ablations in 2007, 2008.  [name] Heart murmur   . HEPATITIS B, CHRONIC 12/30/2006  . History of alcohol abuse   . History of hiatal hernia   . History of medication noncompliance   . History of nuclear stress test    Myoview 3/17: +  chest pain; EF 33%, downsloping ST depression 2, V4-6, LVH with strain, no ischemia on images; intermediate risk   . HYPERCHOLESTEROLEMIA 07/11/2010  . Hypertension   . Left sciatic nerve pain since 04/2015  . LIVER FUNCTION TESTS, ABNORMAL, HX OF 12/30/2006  . MITRAL REGURGITATION 12/30/2006  . 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 PAF (paroxysmal atrial fibrillation) ([facility])    On coumadin  . Rectal bleeding 12/18/2011   Scheduled for colonoscopy.    . Shortness of breath dyspnea    with excertion  . Sleep apnea    "suppose to send mask but they never did" (05/03/2015)  . Type II diabetes mellitus ([facility])     Medications:  Scheduled:  . aspirin  324 mg Oral Once   Infusions:  . heparin      Assessment: 63 y.o. male presenting with chest pain and diaphoresis. PMH significant for CAD, CHF, HTN, DM, and Afib on Xarelto PTA - last dose 9/10 AM. Hgb 8.4, Plts 229, Scr 0.98. Pharmacy consulted for heparin dosing.  Goal of Therapy:  Heparin level 0.3-0.7 units/ml Monitor platelets by anticoagulation protocol: Yes   Plan:  No heparin bolus due to recent xarelto dose Heparin 1100 units/hr Check 6hr APTT and HL Daily INR, APTT, and CBC Monitor s/sx of bleeding   Lorel Monaco, PharmD PGY1 Ambulatory Care Resident [employer] # [phone]

## 2019-04-24 NOTE — Consult Note (Signed)
Cardiology Consultation:   Patient ID: [name] [name] MRN: [id number]; DOB: 1955-12-10  Admit date: 04/24/2019 Date of Consult: 04/24/2019  Primary Care Provider: [name], MD Primary Cardiologist: No primary care provider on file.  Dr. Aundra Dubin Primary Electrophysiologist:  None    Patient Profile:   [name] [name] is a 63 y.o. male with a hx of systolic heart failure who is being seen today for the evaluation of chest pain at the request of Dr Wilson Singer.  History of Present Illness:   [name] [name] is a 63 year old male with diabetes paroxysmal atrial fibrillation and known coronary artery disease seen by Dr. Aundra Dubin last in October 2019 with left heart catheterization showing global hypokinesis EF 45% with diffuse severe distal and branch vessel disease.  No interventional targets but it was suspected that this was causing anginal type pain.  Echo at that time was read as 60 to 65% EF.  He has been admitted in the past with hypertensive urgency as well as chest pain.  Cardiac MRI in 2014 showed EF 44%.  Had a brief episode of atrial fibrillation back in 2015 then went back to normal sinus rhythm.  EF in 2016 was 60%.  In 2016 he had inferior looking ischemia on Cardiolite and went for left and right heart cath, right-sided filling pressures were not significantly elevated, he did have severe distal RCA disease and was treated with drug-eluting stent x2.  EF 50 to 60% with echo at that time.  Cath was done in 2018 again after abnormal Cardiolite but this was the cardiac catheterization that showed diffuse CAD with no good interventional options.  Isosorbide was administered or prescribed but he did not ever appear to start this.  Medical compliance has been an issue.  Today, he was putting on his trash can and started to have chest discomfort with diaphoresis.  Pain also in his back.  Just recently got over pyelonephritis.  Pain was moderate in severity.  Similar to prior  episodes.  Noted some potential black or tarry stools.  His hemoglobin here in the emergency room was 8.4 down from 11.5 one month ago.    Heart Pathway Score:     Past Medical History:  Diagnosis Date  . Anginal pain ([place])   . Anxiety   . Arrhythmia   . CAD (coronary artery disease), native coronary artery    a. Nonobstructive CAD by cath 2013 - diffuse distal and branch vessel CAD, no severe disease in the major coronaries, LV mild global hypokinesis, EF 45%. b. ETT-Sestamibi 5/14: EF 31%, small fixed inferior defect with no ischemia.  . Cholecystitis   . Chronic CHF ([place])    a. Mixed ICM/NICM (?EtOH). EF 35% in 2008. Echo 5/13: EF 60-65%, mod LVH, EF 45% on V gram in 12/2011. EF 12/2012: EF 50-55%, mild LVH, inferobasal HK, mild MR. ETT-Ses 5/14 EF 41%. Cardiac MRI 5/14: EF 44%, mild global HK, subepicardial delayed enhancement in nonspecific RV insertion pattern.  . COLONIC POLYPS, HX OF 12/30/2006  . Gout   . H/O atrial flutter 2007   a. Ablations in 2007, 2008.  [name] Heart murmur   . HEPATITIS B, CHRONIC 12/30/2006  . History of alcohol abuse   . History of hiatal hernia   . History of medication noncompliance   . History of nuclear stress test    Myoview 3/17: + chest pain; EF 33%, downsloping ST depression 2, V4-6, LVH with strain, no ischemia on images; intermediate risk   .  HYPERCHOLESTEROLEMIA 07/11/2010  . Hypertension   . Left sciatic nerve pain since 04/2015  . LIVER FUNCTION TESTS, ABNORMAL, HX OF 12/30/2006  . MITRAL REGURGITATION 12/30/2006  . 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 PAF (paroxysmal atrial fibrillation) ([facility])    On coumadin  . Rectal bleeding 12/18/2011   Scheduled for colonoscopy.    . Shortness of breath dyspnea    with excertion  . Sleep apnea    "suppose to send mask but they never did" (05/03/2015)  . Type II diabetes mellitus ([place])     Past Surgical History:  Procedure Laterality Date  . A FLUTTER ABLATION  2007, 2008   catheter  ablation   . CARDIAC CATHETERIZATION N/A 05/03/2015   Procedure: Right/Left Heart Cath and Coronary Angiography;  Surgeon: Larey Dresser, MD;  Location: [facility];  Service: Cardiovascular;  Laterality: N/A;  . CARDIAC CATHETERIZATION N/A 05/03/2015   Procedure: Coronary Stent Intervention;  Surgeon: Jettie Booze, MD;  Location: [facility];  Service: Cardiovascular;  Laterality: N/A;  . CARDIAC CATHETERIZATION N/A 11/04/2015   Procedure: Left Heart Cath and Coronary Angiography;  Surgeon: Larey Dresser, MD;  Location: [facility];  Service: Cardiovascular;  Laterality: N/A;  . CARDIOVERSION N/A 08/24/2015   Procedure: CARDIOVERSION;  Surgeon: Thayer Headings, MD;  Location: [facility]OS[facility];  Service: Cardiovascular;  Laterality: N/A;  . CHOLECYSTECTOMY N/A 02/07/2016   Procedure: LAPAROSCOPIC CHOLECYSTECTOMY WITH  INTRAOPERATIVE CHOLANGIOGRAM;  Surgeon: Greer Pickerel, MD;  Location: [facility];  Service: General;  Laterality: N/A;  . CORONARY ANGIOPLASTY  12/05/01  . LEFT HEART CATH AND CORONARY ANGIOGRAPHY N/A 12/28/2016   Procedure: Left Heart Cath and Coronary Angiography;  Surgeon: Larey Dresser, MD;  Location: [facility];  Service: Cardiovascular;  Laterality: N/A;  . LEFT HEART CATHETERIZATION WITH CORONARY ANGIOGRAM N/A 12/19/2011   Procedure: LEFT HEART CATHETERIZATION WITH CORONARY ANGIOGRAM;  Surgeon: Larey Dresser, MD;  Location: [facility];  Service: Cardiovascular;  Laterality: N/A;  . OSTEOCHONDROMA EXCISION Left 1972   "took bone tumor off my shoulder"     Home Medications:  Prior to Admission medications   Medication Sig Start Date End Date Taking? Authorizing Provider  ACCU-CHEK SOFTCLIX LANCETS lancets Use as instructed for three times daily testing of blood glucose 09/06/16   Jegede, Olugbemiga E, MD  amLODipine (NORVASC) 5 MG tablet Take 1 tablet (5 mg total) by mouth daily. 09/29/18   Charlott Rakes, MD  atorvastatin (LIPITOR) 80 MG tablet  Take 1 tablet (80 mg total) by mouth daily. 09/29/18   Charlott Rakes, MD  Blood Glucose Monitoring Suppl (ACCU-CHEK AVIVA PLUS) w/Device KIT Use as directed for 3 times daily testing of blood glucose 09/06/16   Jegede, Olugbemiga E, MD  carvedilol (COREG) 25 MG tablet Take 1 tablet (25 mg total) by mouth 2 (two) times daily with a meal. 09/29/18   Charlott Rakes, MD  chlorpheniramine-HYDROcodone (TUSSIONEX PENNKINETIC ER) 10-8 MG/5ML SUER Take 5 mLs by mouth every 12 (twelve) hours as needed for cough. 09/11/18   Robyn Haber, MD  cyclobenzaprine (FLEXERIL) 10 MG tablet Take 1 tablet (10 mg total) by mouth at bedtime. 11/05/18   Charlott Rakes, MD  furosemide (LASIX) 20 MG tablet TAKE TWO TABLETS BY MOUTH IN THE MORNING, THEN TAKE ONE IN THE EVENING Patient taking differently: Take 20-40 mg by mouth See admin instructions. Take 2 tablets (40 mg) by mouth every morning and 1 tablet (20 mg)  in the evening 09/29/18   Charlott Rakes, MD  gabapentin (NEURONTIN) 300 MG capsule Take 1 capsule (300 mg total) by mouth at bedtime. 11/05/18   Charlott Rakes, MD  glucose blood (ACCU-CHEK AVIVA PLUS) test strip Use as instructed for 3 times daily testing of blood glucose 09/06/16   Jegede, Olugbemiga E, MD  hydrALAZINE (APRESOLINE) 50 MG tablet Take 1.5 tablets (75 mg total) by mouth 3 (three) times daily. 12/04/18   Larey Dresser, MD  Insulin Glargine (LANTUS SOLOSTAR) 100 UNIT/ML Solostar Pen Inject 35 Units into the skin every morning. And pen needles 1/day Patient taking differently: Inject 35 Units into the skin daily. And pen needles 1/day 07/08/18   Charlott Rakes, MD  isosorbide mononitrate (IMDUR) 30 MG 24 hr tablet Take 1 tablet (30 mg total) by mouth daily. 10/28/18   Larey Dresser, MD  Lancet Devices [facility]) lancets Use as instructed for 3 times daily testing of blood glucose 09/06/16   Tresa Garter, MD  metFORMIN (GLUCOPHAGE) 1000 MG tablet Take 1 tablet (1,000 mg total) by  mouth 2 (two) times daily with a meal. 09/29/18   Charlott Rakes, MD  nitroGLYCERIN (NITROSTAT) 0.4 MG SL tablet Place 1 tablet (0.4 mg total) under the tongue every 5 (five) minutes as needed for chest pain (x 3 tabs daily). 09/29/18   Charlott Rakes, MD  ondansetron (ZOFRAN-ODT) 4 MG disintegrating tablet Take 1 tablet (4 mg total) by mouth every 8 (eight) hours as needed for nausea or vomiting. 03/13/19   Zigmund Gottron, NP  rivaroxaban (XARELTO) 20 MG TABS tablet Take 1 tablet (20 mg total) by mouth daily with supper. 01/27/19   Larey Dresser, MD  sotalol (BETAPACE) 120 MG tablet TAKE 1 TABLET BY MOUTH EVERY 12 HOURS 04/03/19   Charlott Rakes, MD  TRUEPLUS LANCETS 28G MISC 1 each by Does not apply route 3 (three) times daily. 08/22/16   Tresa Garter, MD    Inpatient Medications: Scheduled Meds:  Continuous Infusions:  PRN Meds:   Allergies:    Allergies  Allergen Reactions  . Shrimp [Shellfish Allergy] Nausea And Vomiting  . Ace Inhibitors Cough  . Other Hives and Other (See Comments)    Patient reports developing hives after receiving "some antibiotic given in 1980''s at [facility]". He does not know which antibiotic.    Social History:   Social History   Socioeconomic History  . Marital status: Widowed    Spouse name: Not on file  . Number of children: 2  . Years of education: Not on file  . Highest education level: Not on file  Occupational History    Employer: UNEMPLOYED  Social Needs  . Financial resource strain: Not on file  . Food insecurity    Worry: Not on file    Inability: Not on file  . Transportation needs    Medical: Not on file    Non-medical: Not on file  Tobacco Use  . Smoking status: Former Smoker    Packs/day: 4.00    Years: 18.00    Pack years: 72.00    Types: Cigarettes    Quit date: 08/14/1983    Years since quitting: 35.7  . Smokeless tobacco: Never Used  Substance and Sexual Activity  . Alcohol use: Yes     Alcohol/week: 16.0 standard drinks    Types: 6 Cans of beer, 10 Shots of liquor per week    Comment: occasionally  . Drug use: Yes  Types: Marijuana    Comment: occasionally  . Sexual activity: Never  Lifestyle  . Physical activity    Days per week: Not on file    Minutes per session: Not on file  . Stress: Not on file  Relationships  . Social Herbalist on phone: Not on file    Gets together: Not on file    Attends religious service: Not on file    Active member of club or organization: Not on file    Attends meetings of clubs or organizations: Not on file    Relationship status: Not on file  . Intimate partner violence    Fear of current or ex partner: Not on file    Emotionally abused: Not on file    Physically abused: Not on file    Forced sexual activity: Not on file  Other Topics Concern  . Not on file  Social History Narrative  . Not on file    Family History:    Family History  Problem Relation Age of Onset  . Diabetes Mother   . Hypertension Mother   . Heart attack Neg Hx   . Stroke Neg Hx      ROS:  Please see the history of present illness.  No fevers chills nausea vomiting syncope All other ROS reviewed and negative.     Physical Exam/Data:   Vitals:   04/24/19 1205 04/24/19 1523  BP: (!) 147/81 (!) 163/76  Pulse: 78 72  Resp: 16 18  Temp: 98.9 F (37.2 C) 98.3 F (36.8 C)  TempSrc: Oral Oral  SpO2: 100% 100%  Weight: 89.4 kg   Height: 6' (1.829 m)    No intake or output data in the 24 hours ending 04/24/19 1752 Last 3 Weights 04/24/2019 04/23/2019 10/19/2018  Weight (lbs) 197 lb 197 lb 207 lb 3.2 oz  Weight (kg) 89.359 kg 89.359 kg 93.985 kg     Body mass index is 26.72 kg/m.  General:  Well nourished, well developed, in no acute distress HEENT: normal Lymph: no adenopathy Neck: no JVD Endocrine:  No thryomegaly Vascular: No carotid bruits; FA pulses 2+ bilaterally without bruits  Cardiac:  normal S1, S2; RRR; no murmur   Lungs:  clear to auscultation bilaterally, no wheezing, rhonchi or rales  Abd: soft, nontender, no hepatomegaly  Ext: no edema Musculoskeletal:  No deformities, BUE and BLE strength normal and equal Skin: warm and dry  Neuro:  CNs 2-12 intact, no focal abnormalities noted Psych:  Normal affect   EKG:  The EKG was personally reviewed and demonstrates: Sinus rhythm with LVH and repolarization abnormalities, no significant change from prior  Telemetry:  Telemetry was personally reviewed and demonstrates: Normal sinus rhythm, no adverse arrhythmias  Relevant CV Studies: Cardiac catheterization 10-Oct-2016:   There was diffuse coronary artery disease, similar to prior study in 3/17 but mildly more extensive.  No good target for intervention.  There were tight stenoses of small to moderate branch vessels off ramus not amenable to intervention.  60% mid RCA probably is not a source of angina.  Would not approach the far distal LAD stenosis percutaneously.   At this point, I think he will be best served by aggressive medical management.  He needs to make sure he takes all his meds.  He needs good diabetes control (not adequate at this point).  He needs to make sure BP is under control.  He needs to make sure he takes his statin (had been  off for a number of weeks).  I will add Imdur to his regimen.   In the future, I would not be surprised if he were to need CABG or return with MI, especially if he does not get his risk factors under control.   Left Main  Mild distal LM narrowing.  Left Anterior Descending  Far distal LAD with 90% stenosis. Diffuse 30-40% stenoses throughout the proximal and mid LAD.  Ramus Intermedius  Large vessel. Two serial 40% proximal ramus stenoses, two serial 50% mid ramus stenoses. Small to moderate 1st branch off ramus with 80% ostial stenosis. Small to moderate 2nd branch off ramus with 90% ostial stenosis.  Left Circumflex  Relatively small LCx with comparatively large  ramus. 40% ostial LCx stenosis, 40% proximal LCx stenosis. Luminal irregularities throughout the rest of the vessel.  Right Coronary Artery  Patent proximal and distal RCA stents, distal stent with 30% in-stent restenosis. 60% mid RCA stenosis, mildly progressive compared to prior study.  Intervention  No interventions have been documented.   Laboratory Data:  High Sensitivity Troponin:   Recent Labs  Lab 04/24/19 1226 04/24/19 1700  TROPONINIHS 24* 22*     Chemistry Recent Labs  Lab 04/24/19 1226  NA 138  K 3.5  CL 108  CO2 24  GLUCOSE 124*  BUN 6*  CREATININE 0.98  CALCIUM 8.7*  GFRNONAA >60  GFRAA >60  ANIONGAP 6    No results for input(s): PROT, ALBUMIN, AST, ALT, ALKPHOS, BILITOT in the last 168 hours. Hematology Recent Labs  Lab 04/24/19 1226  WBC 6.8  RBC 3.34*  HGB 8.4*  HCT 28.1*  MCV 84.1  MCH 25.1*  MCHC 29.9*  RDW 15.5  PLT 229   BNPNo results for input(s): BNP, PROBNP in the last 168 hours.  DDimer No results for input(s): DDIMER in the last 168 hours.   Radiology/Studies:  Dg Chest 2 View  Result Date: 04/24/2019 CLINICAL DATA:  Chest pain. EXAM: CHEST - 2 VIEW COMPARISON:  Radiographs of October 19, 2018. FINDINGS: Stable cardiomediastinal silhouette. No pneumothorax or pleural effusion is noted. No acute pulmonary disease is noted. Bony thorax is unremarkable. IMPRESSION: No active cardiopulmonary disease. Electronically Signed   By: Marijo Conception M.D.   On: 04/24/2019 13:04    Assessment and Plan:   Ongoing episodes of angina with known coronary artery disease -Certainly could be exacerbated by his marked hemoglobin reduction, possible GI source. - Isosorbide had been prescribed in the past but medical compliance has been an issue.  Control of hypertension also has been challenging.  Last heart catheterization in 2018 demonstrated diffuse disease but no good interventional targets. - Both high-sensitivity troponins are low level 28 and  24.  The fact that it is not increasing is a good sign. - Originally, ER was considering starting heparin but this is been stopped given his decreased hemoglobin. - I would recommend isosorbide 30 mg once a day.  Would go ahead and hold his aspirin and hold his Xarelto given his reduced hemoglobin.  Continue atorvastatin 80 mg. - At this time, given his mildly elevated downward trending troponin and essentially unchanged ECG, noninvasive management. - Echocardiogram may be helpful.  Paroxysmal atrial fibrillation - Holding Xarelto with hemoglobin reduction.  Currently in sinus rhythm.  Prior cardiomyopathy -EF has ranged quite a bit from 26% up to normal on last evaluation at 60%.  No changes made.  Essential hypertension uncontrolled - Continue with home meds encourage control.  Obstructive sleep  apnea -CPAP, has seen Dr. Radford Pax in the past.  Noncompliance -Encourage use of medications.     For questions or updates, please contact [place] Please consult [email or web address] for contact info under     Signed, Candee Furbish, MD  04/24/2019 5:52 PM

## 2019-04-24 NOTE — ED Provider Notes (Addendum)
[facility] EMERGENCY DEPARTMENT Provider Note   CSN: [id number] Arrival date & time: 04/24/19  1154     History   Chief Complaint Chief Complaint  Patient presents with  . Chest Pain    HPI [name] [name] is a 63 y.o. male.     HPI   63 year old male with chest pain.  Onset last night at 4 AM when he was taking the garbage out.  Describes pressure in the center of his chest.  Associated with shortness of breath and diaphoresis.  Improved when he went back to bed and rested.  He told his daughter about it this morning and she encouraged him to seek evaluation.  He states that approximately 3 days ago he had a similar episode also while exerting himself.  He is currently symptom-free.   He has a past history of CAD.  Last catheterization in 2018. "There was diffuse coronary artery disease, similar to prior study in 3/17 but mildly more extensive.  No good target for intervention.  There were tight stenoses of small to moderate branch vessels off ramus not amenable to intervention.  60% mid RCA probably is not a source of angina.  Would not approach the far distal LAD stenosis percutaneously."   Past Medical History:  Diagnosis Date  . Anginal pain ([place])   . Anxiety   . Arrhythmia   . CAD (coronary artery disease), native coronary artery    a. Nonobstructive CAD by cath 2013 - diffuse distal and branch vessel CAD, no severe disease in the major coronaries, LV mild global hypokinesis, EF 45%. b. ETT-Sestamibi 5/14: EF 31%, small fixed inferior defect with no ischemia.  . Cholecystitis   . Chronic CHF ([place])    a. Mixed ICM/NICM (?EtOH). EF 35% in 2008. Echo 5/13: EF 60-65%, mod LVH, EF 45% on V gram in 12/2011. EF 12/2012: EF 50-55%, mild LVH, inferobasal HK, mild MR. ETT-Ses 5/14 EF 41%. Cardiac MRI 5/14: EF 44%, mild global HK, subepicardial delayed enhancement in nonspecific RV insertion pattern.  . COLONIC POLYPS, HX OF 12/30/2006  . Gout   . H/O atrial  flutter 2007   a. Ablations in 2007, 2008.  [name] Heart murmur   . HEPATITIS B, CHRONIC 12/30/2006  . History of alcohol abuse   . History of hiatal hernia   . History of medication noncompliance   . History of nuclear stress test    Myoview 3/17: + chest pain; EF 33%, downsloping ST depression 2, V4-6, LVH with strain, no ischemia on images; intermediate risk   . HYPERCHOLESTEROLEMIA 07/11/2010  . Hypertension   . Left sciatic nerve pain since 04/2015  . LIVER FUNCTION TESTS, ABNORMAL, HX OF 12/30/2006  . MITRAL REGURGITATION 12/30/2006  . 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 PAF (paroxysmal atrial fibrillation) ([facility])    On coumadin  . Rectal bleeding 12/18/2011   Scheduled for colonoscopy.    . Shortness of breath dyspnea    with excertion  . Sleep apnea    "suppose to send mask but they never did" (05/03/2015)  . Type II diabetes mellitus [facility])     Patient Active Problem List   Diagnosis Date Noted  . Overweight (BMI 25.0-29.9) 02/28/2017  . Unstable angina ([place]) 08/03/2016  . S/P laparoscopic cholecystectomy 02/07/2016  . Coronary artery disease involving native coronary artery with unstable angina pectoris ([place]) 12/29/2015  . Diabetes ([place]) 11/12/2015  . Chronic anticoagulation 08/22/2015  . Atrial flutter with controlled response ([place])  08/22/2015  . RUQ pain   . Cholelithiases- drain in place 06/04/2015  . Essential hypertension   . Chronic diastolic CHF (congestive heart failure) ([place]) 04/24/2015  . Excessive daytime sleepiness 11/25/2014  . OSA (obstructive sleep apnea) 11/25/2014  . NICM (nonischemic cardiomyopathy) ([place]) 03/11/2014  . Anticoagulation goal of INR 2 to 3 10/19/2013  . Eczema 10/19/2013  . Low libido 10/16/2013  . Persistent atrial fibrillation 10/06/2013  . Rectal bleeding 12/18/2011  . HYPERCHOLESTEROLEMIA 07/11/2010  . DENTAL CARIES 06/30/2010  . GOUT 05/05/2009  . HEPATITIS B, CHRONIC 12/30/2006  . OSTEOCHONDROMA 12/30/2006  .  Mitral valve disorder 12/30/2006  . LIVER FUNCTION TESTS, ABNORMAL, HX OF 12/30/2006  . COLONIC POLYPS, HX OF 12/30/2006  . COLONOSCOPY, HX OF 05/12/2001    Past Surgical History:  Procedure Laterality Date  . A FLUTTER ABLATION  2007, 2008   catheter ablation   . CARDIAC CATHETERIZATION N/A 05/03/2015   Procedure: Right/Left Heart Cath and Coronary Angiography;  Surgeon: Larey Dresser, MD;  Location: [facility] CV LAB;  Service: Cardiovascular;  Laterality: N/A;  . CARDIAC CATHETERIZATION N/A 05/03/2015   Procedure: Coronary Stent Intervention;  Surgeon: Jettie Booze, MD;  Location: [facility] [place] CV LAB;  Service: Cardiovascular;  Laterality: N/A;  . CARDIAC CATHETERIZATION N/A 11/04/2015   Procedure: Left Heart Cath and Coronary Angiography;  Surgeon: Larey Dresser, MD;  Location: [facility] CV LAB;  Service: Cardiovascular;  Laterality: N/A;  . CARDIOVERSION N/A 08/24/2015   Procedure: CARDIOVERSION;  Surgeon: Thayer Headings, MD;  Location: [facility] ENDOSCOPY;  Service: Cardiovascular;  Laterality: N/A;  . CHOLECYSTECTOMY N/A 02/07/2016   Procedure: LAPAROSCOPIC CHOLECYSTECTOMY WITH  INTRAOPERATIVE CHOLANGIOGRAM;  Surgeon: Greer Pickerel, MD;  Location: [facility] ORS;  Service: General;  Laterality: N/A;  . CORONARY ANGIOPLASTY  12/05/01  . LEFT HEART CATH AND CORONARY ANGIOGRAPHY N/A 12/28/2016   Procedure: Left Heart Cath and Coronary Angiography;  Surgeon: Larey Dresser, MD;  Location: [facility] CV LAB;  Service: Cardiovascular;  Laterality: N/A;  . LEFT HEART CATHETERIZATION WITH CORONARY ANGIOGRAM N/A 12/19/2011   Procedure: LEFT HEART CATHETERIZATION WITH CORONARY ANGIOGRAM;  Surgeon: Larey Dresser, MD;  Location: [facility] CATH LAB;  Service: Cardiovascular;  Laterality: N/A;  . OSTEOCHONDROMA EXCISION Left 1972   "took bone tumor off my shoulder"        Home Medications    Prior to Admission medications   Medication Sig Start Date End Date Taking? Authorizing Provider  ACCU-CHEK  SOFTCLIX LANCETS lancets Use as instructed for three times daily testing of blood glucose 09/06/16   Jegede, Olugbemiga E, MD  amLODipine (NORVASC) 5 MG tablet Take 1 tablet (5 mg total) by mouth daily. 09/29/18   Charlott Rakes, MD  atorvastatin (LIPITOR) 80 MG tablet Take 1 tablet (80 mg total) by mouth daily. 09/29/18   Charlott Rakes, MD  Blood Glucose Monitoring Suppl (ACCU-CHEK AVIVA PLUS) w/Device KIT Use as directed for 3 times daily testing of blood glucose 09/06/16   Jegede, Olugbemiga E, MD  carvedilol (COREG) 25 MG tablet Take 1 tablet (25 mg total) by mouth 2 (two) times daily with a meal. 09/29/18   Charlott Rakes, MD  chlorpheniramine-HYDROcodone (TUSSIONEX PENNKINETIC ER) 10-8 MG/5ML SUER Take 5 mLs by mouth every 12 (twelve) hours as needed for cough. 09/11/18   Robyn Haber, MD  cyclobenzaprine (FLEXERIL) 10 MG tablet Take 1 tablet (10 mg total) by mouth at bedtime. 11/05/18   Charlott Rakes, MD  furosemide (LASIX) 20 MG tablet TAKE  TWO TABLETS BY MOUTH IN THE MORNING, THEN TAKE ONE IN THE EVENING Patient taking differently: Take 20-40 mg by mouth See admin instructions. Take 2 tablets (40 mg) by mouth every morning and 1 tablet (20 mg) in the evening 09/29/18   Charlott Rakes, MD  gabapentin (NEURONTIN) 300 MG capsule Take 1 capsule (300 mg total) by mouth at bedtime. 11/05/18   Charlott Rakes, MD  glucose blood (ACCU-CHEK AVIVA PLUS) test strip Use as instructed for 3 times daily testing of blood glucose 09/06/16   Jegede, Olugbemiga E, MD  hydrALAZINE (APRESOLINE) 50 MG tablet Take 1.5 tablets (75 mg total) by mouth 3 (three) times daily. 12/04/18   Larey Dresser, MD  Insulin Glargine (LANTUS SOLOSTAR) 100 UNIT/ML Solostar Pen Inject 35 Units into the skin every morning. And pen needles 1/day Patient taking differently: Inject 35 Units into the skin daily. And pen needles 1/day 07/08/18   Charlott Rakes, MD  isosorbide mononitrate (IMDUR) 30 MG 24 hr tablet Take 1 tablet (30 mg  total) by mouth daily. 10/28/18   Larey Dresser, MD  Lancet Devices [facility]) lancets Use as instructed for 3 times daily testing of blood glucose 09/06/16   Tresa Garter, MD  metFORMIN (GLUCOPHAGE) 1000 MG tablet Take 1 tablet (1,000 mg total) by mouth 2 (two) times daily with a meal. 09/29/18   Charlott Rakes, MD  nitroGLYCERIN (NITROSTAT) 0.4 MG SL tablet Place 1 tablet (0.4 mg total) under the tongue every 5 (five) minutes as needed for chest pain (x 3 tabs daily). 09/29/18   Charlott Rakes, MD  ondansetron (ZOFRAN-ODT) 4 MG disintegrating tablet Take 1 tablet (4 mg total) by mouth every 8 (eight) hours as needed for nausea or vomiting. 03/13/19   Zigmund Gottron, NP  rivaroxaban (XARELTO) 20 MG TABS tablet Take 1 tablet (20 mg total) by mouth daily with supper. 01/27/19   Larey Dresser, MD  sotalol (BETAPACE) 120 MG tablet TAKE 1 TABLET BY MOUTH EVERY 12 HOURS 04/03/19   Charlott Rakes, MD  TRUEPLUS LANCETS 28G MISC 1 each by Does not apply route 3 (three) times daily. 08/22/16   Tresa Garter, MD    Family History Family History  Problem Relation Age of Onset  . Diabetes Mother   . Hypertension Mother   . Heart attack Neg Hx   . Stroke Neg Hx     Social History Social History   Tobacco Use  . Smoking status: Former Smoker    Packs/day: 4.00    Years: 18.00    Pack years: 72.00    Types: Cigarettes    Quit date: 08/14/1983    Years since quitting: 35.7  . Smokeless tobacco: Never Used  Substance Use Topics  . Alcohol use: Yes    Alcohol/week: 16.0 standard drinks    Types: 6 Cans of beer, 10 Shots of liquor per week    Comment: occasionally  . Drug use: Yes    Types: Marijuana    Comment: occasionally     Allergies   Shrimp [shellfish allergy], Ace inhibitors, and Other   Review of Systems Review of Systems  All systems reviewed and negative, other than as noted in HPI.  Physical Exam Updated Vital Signs BP (!) 163/76 (BP Location:  Left Arm)   Pulse 72   Temp 98.3 F (36.8 C) (Oral)   Resp 18   Ht 6' (1.829 m)   Wt 89.4 kg   SpO2 100%   BMI 26.72 kg/m  Physical Exam Vitals signs and nursing note reviewed.  Constitutional:      General: He is not in acute distress.    Appearance: He is well-developed.  HENT:     Head: Normocephalic and atraumatic.  Eyes:     General:        Right eye: No discharge.        Left eye: No discharge.     Conjunctiva/sclera: Conjunctivae normal.  Neck:     Musculoskeletal: Neck supple.  Cardiovascular:     Rate and Rhythm: Normal rate and regular rhythm.     Heart sounds: Normal heart sounds. No murmur. No friction rub. No gallop.   Pulmonary:     Effort: Pulmonary effort is normal. No respiratory distress.     Breath sounds: Normal breath sounds.  Abdominal:     General: There is no distension.     Palpations: Abdomen is soft.     Tenderness: There is no abdominal tenderness.  Musculoskeletal:        General: No tenderness.  Skin:    General: Skin is warm and dry.  Neurological:     Mental Status: He is alert.  Psychiatric:        Behavior: Behavior normal.        Thought Content: Thought content normal.      ED Treatments / Results  Labs (all labs ordered are listed, but only abnormal results are displayed) Labs Reviewed  BASIC METABOLIC PANEL - Abnormal; Notable for the following components:      Result Value   Glucose, Bld 124 (*)    BUN 6 (*)    Calcium 8.7 (*)    All other components within normal limits  CBC - Abnormal; Notable for the following components:   RBC 3.34 (*)    Hemoglobin 8.4 (*)    HCT 28.1 (*)    MCH 25.1 (*)    MCHC 29.9 (*)    All other components within normal limits  TROPONIN I (HIGH SENSITIVITY) - Abnormal; Notable for the following components:   Troponin I (High Sensitivity) 24 (*)    All other components within normal limits  TROPONIN I (HIGH SENSITIVITY) - Abnormal; Notable for the following components:   Troponin I  (High Sensitivity) 22 (*)    All other components within normal limits  PROTIME-INR  APTT  HEPARIN LEVEL (UNFRACTIONATED)  CBC  OCCULT BLOOD X 1 CARD TO LAB, STOOL    EKG EKG Interpretation  Date/Time:  Friday April 24 2019 12:03:36 EDT Ventricular Rate:  76 PR Interval:  224 QRS Duration: 86 QT Interval:  402 QTC Calculation: 452 R Axis:   72 Text Interpretation:  Sinus rhythm with 1st degree A-V block Left ventricular hypertrophy with repolarization abnormality Abnormal ECG Confirmed by Virgel Manifold [phone]) on 04/24/2019 4:12:04 PM   Radiology Dg Chest 2 View  Result Date: 04/24/2019 CLINICAL DATA:  Chest pain. EXAM: CHEST - 2 VIEW COMPARISON:  Radiographs of October 19, 2018. FINDINGS: Stable cardiomediastinal silhouette. No pneumothorax or pleural effusion is noted. No acute pulmonary disease is noted. Bony thorax is unremarkable. IMPRESSION: No active cardiopulmonary disease. Electronically Signed   By: Marijo Conception M.D.   On: 04/24/2019 13:04    Procedures Procedures (including critical care time)  CRITICAL CARE Performed by: Virgel Manifold Total critical care time: 35 minutes Critical care time was exclusive of separately billable procedures and treating other patients. Critical care was necessary to treat or prevent imminent or life-threatening deterioration.  Critical care was time spent personally by me on the following activities: development of treatment plan with patient and/or surrogate as well as nursing, discussions with consultants, evaluation of patient's response to treatment, examination of patient, obtaining history from patient or surrogate, ordering and performing treatments and interventions, ordering and review of laboratory studies, ordering and review of radiographic studies, pulse oximetry and re-evaluation of patient's condition.   Medications Ordered in ED Medications  sodium chloride flush (NS) 0.9 % injection 3 mL (3 mLs Intravenous Given  04/24/19 1711)  aspirin chewable tablet 324 mg (324 mg Oral Given 04/24/19 1743)     Initial Impression / Assessment and Plan / ED Course  I have reviewed the triage vital signs and the nursing notes.  Pertinent labs & imaging results that were available during my care of the patient were reviewed by me and considered in my medical decision making (see chart for details).        63yM with CP. Sounds like typical symptoms of ACS. Known CAD. Initially discussed with cardiology, Dr Marlou Porch, and they'll see in consult. On further review and speaking with patient more, this may actually be from anemia causing some degree of demand ischemia. Hemoglobin 8.4. Normal at the beginning of the year. He says he has been having dark for the past 2 days. Check hemoccult. Received aspirin. Heparin ordered but canceled prior to actually getting any.   Final Clinical Impressions(s) / ED Diagnoses   Final diagnoses:  Chest pain, unspecified type  Anemia, unspecified type    ED Discharge Orders    None       Virgel Manifold, MD 04/28/19 [id number]    Virgel Manifold, MD 05/10/19 [phone]

## 2019-04-24 NOTE — ED Triage Notes (Addendum)
Was putting out trash can at 4 am this am and he had cp  And b roke  Out in  Sweat , pain comes and goes with exertion also having backl [pain  X a couple of weeks states just getting over kidney infection

## 2019-04-24 NOTE — ED Notes (Signed)
Pending result for Stool Occult. Dr. Flossie Buffy informed.   Primary RN: Domingo Mend informed need to result.

## 2019-04-24 NOTE — H&P (Signed)
History and Physical    [name] [name] YWV:[id number] DOB: Jan 01, 1956 DOA: 04/24/2019  PCP: [name], MD  Patient coming from: Home  I have personally briefly reviewed patient's old medical records in [place]  Chief Complaint: Chest pain  HPI: [name] [name] is a 63 y.o. male with medical history significant of nonischemic cardiomyopathy, CAD, chronic diastolic CHF, atrial flutter on Xarelto, hypertension, OSA who presents with chest pain.  Patient reports that around 4 AM early this morning he was taking out the garbage and felt a sudden acute onset of stabbing/pressure mid sternal chest pain with associated shortness of breath.  No nausea or vomiting.  Chest pain subsided after laying in bed for an hour but he was very diaphoretic.  Daughters then prompted him to present to the emergency department.  Patient also reports that last week he had an infection and was given Cipro and did not quite get back to his baseline.  He also noted dark stools a few days ago but no bright red blood per rectum.  States that his stool after that return back to normal.  Denies any other bleeding.  His last dose of Xarelto was yesterday morning.  Reports he had a colonoscopy possibly in 2016 with some polyps found.  Also endorses issues with constipation.    ED Course: He was afebrile, hypertensive with systolics of 854O and comfortable on room air.  CBC showed no leukocytosis but had anemia of 8.4 (down from 11.5 a month before).  BMP otherwise unremarkable.  Troponin of 22 and 24.  Chest x-ray showed no acute cardiopulmonary process.  EKG showed sinus rhythm with first-degree block.  Review of Systems:  Constitutional: No Weight Change, No Fever ENT/Mouth: No sore throat, No Rhinorrhea Eyes: No Eye Pain, No Vision Changes Cardiovascular: No Chest Pain, no SOB, No PND, No Dyspnea on Exertion, No Orthopnea, No Claudication, No Edema, No Palpitations Respiratory: No Cough, No  Sputum, No Wheezing, Dyspnea  Gastrointestinal: No Nausea, No Vomiting, No Diarrhea, No Constipation, No Pain Genitourinary: no Urinary Incontinence, No Urgency, No Flank Pain Musculoskeletal: No Arthralgias, No Myalgias Skin: No Skin Lesions, No Pruritus, Neuro: no Weakness, No Numbness,  No Loss of Consciousness, No Syncope Psych: No Anxiety/Panic, No Depression, decrease appetite Heme/Lymph: No Bruising, No Bleeding  Past Medical History:  Diagnosis Date  . Anginal pain ([place])   . Anxiety   . Arrhythmia   . CAD (coronary artery disease), native coronary artery    a. Nonobstructive CAD by cath 2013 - diffuse distal and branch vessel CAD, no severe disease in the major coronaries, LV mild global hypokinesis, EF 45%. b. ETT-Sestamibi 5/14: EF 31%, small fixed inferior defect with no ischemia.  . Cholecystitis   . Chronic CHF ([place])    a. Mixed ICM/NICM (?EtOH). EF 35% in 2008. Echo 5/13: EF 60-65%, mod LVH, EF 45% on V gram in 12/2011. EF 12/2012: EF 50-55%, mild LVH, inferobasal HK, mild MR. ETT-Ses 5/14 EF 41%. Cardiac MRI 5/14: EF 44%, mild global HK, subepicardial delayed enhancement in nonspecific RV insertion pattern.  . COLONIC POLYPS, HX OF 12/30/2006  . Gout   . H/O atrial flutter 2007   a. Ablations in 2007, 2008.  [name] Heart murmur   . HEPATITIS B, CHRONIC 12/30/2006  . History of alcohol abuse   . History of hiatal hernia   . History of medication noncompliance   . History of nuclear stress test    Myoview 3/17: + chest pain;  EF 33%, downsloping ST depression 2, V4-6, LVH with strain, no ischemia on images; intermediate risk   . HYPERCHOLESTEROLEMIA 07/11/2010  . Hypertension   . Left sciatic nerve pain since 04/2015  . LIVER FUNCTION TESTS, ABNORMAL, HX OF 12/30/2006  . MITRAL REGURGITATION 12/30/2006  . 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 PAF (paroxysmal atrial fibrillation) ([facility])    On coumadin  . Rectal bleeding 12/18/2011   Scheduled for colonoscopy.    .  Shortness of breath dyspnea    with excertion  . Sleep apnea    "suppose to send mask but they never did" (05/03/2015)  . Type II diabetes mellitus ([place])     Past Surgical History:  Procedure Laterality Date  . A FLUTTER ABLATION  2007, 2008   catheter ablation   . CARDIAC CATHETERIZATION N/A 05/03/2015   Procedure: Right/Left Heart Cath and Coronary Angiography;  Surgeon: Larey Dresser, MD;  Location: [facility];  Service: Cardiovascular;  Laterality: N/A;  . CARDIAC CATHETERIZATION N/A 05/03/2015   Procedure: Coronary Stent Intervention;  Surgeon: Jettie Booze, MD;  Location: [place];  Service: Cardiovascular;  Laterality: N/A;  . CARDIAC CATHETERIZATION N/A 11/04/2015   Procedure: Left Heart Cath and Coronary Angiography;  Surgeon: Larey Dresser, MD;  Location: [place] [facility];  Service: Cardiovascular;  Laterality: N/A;  . CARDIOVERSION N/A 08/24/2015   Procedure: CARDIOVERSION;  Surgeon: Thayer Headings, MD;  Location[facility] ENDOSCOPY;  Service: Cardiovascular;  Laterality: N/A;  . CHOLECYSTECTOMY N/A 02/07/2016   Procedure: LAPAROSCOPIC CHOLECYSTECTOMY WITH  INTRAOPERATIVE CHOLANGIOGRAM;  Surgeon: Greer Pickerel, MD;  Location: [facility];  Service: General;  Laterality: N/A;  . CORONARY ANGIOPLASTY  12/05/01  . LEFT HEART CATH AND CORONARY ANGIOGRAPHY N/A 12/28/2016   Procedure: Left Heart Cath and Coronary Angiography;  Surgeon: Larey Dresser, MD;  Location: [place];  Service: Cardiovascular;  Laterality: N/A;  . LEFT HEART CATHETERIZATION WITH CORONARY ANGIOGRAM N/A 12/19/2011   Procedure: LEFT HEART CATHETERIZATION WITH CORONARY ANGIOGRAM;  Surgeon: Larey Dresser, MD;  Location: [facility] CATH LAB;  Service: Cardiovascular;  Laterality: N/A;  . OSTEOCHONDROMA EXCISION Left 1972   "took bone tumor off my shoulder"     reports that he quit smoking about 35 years ago. His smoking use included cigarettes. He has a 72.00 pack-year smoking history. He has never  used smokeless tobacco. He reports current alcohol use of about 16.0 standard drinks of alcohol per week. He reports current drug use. Drug: Marijuana.  Allergies  Allergen Reactions  . Shrimp [Shellfish Allergy] Nausea And Vomiting  . Ace Inhibitors Cough  . Other Hives and Other (See Comments)    Patient reports developing hives after receiving "some antibiotic given in 1980''s [facility]". He does not know which antibiotic.    Family History  Problem Relation Age of Onset  . Diabetes Mother   . Hypertension Mother   . Heart attack Neg Hx   . Stroke Neg Hx     Family history reviewed and not pertinent  Prior to Admission medications   Medication Sig Start Date End Date Taking? Authorizing Provider  amLODipine (NORVASC) 5 MG tablet Take 1 tablet (5 mg total) by mouth daily. 09/29/18  Yes Charlott Rakes, MD  atorvastatin (LIPITOR) 80 MG tablet Take 1 tablet (80 mg total) by mouth daily. 09/29/18  Yes Charlott Rakes, MD  carvedilol (COREG) 25 MG tablet Take 1 tablet (25 mg total) by mouth  2 (two) times daily with a meal. 09/29/18  Yes Newlin, Enobong, MD  furosemide (LASIX) 20 MG tablet TAKE TWO TABLETS BY MOUTH IN THE MORNING, THEN TAKE ONE IN THE EVENING Patient taking differently: Take 20-40 mg by mouth See admin instructions. Take 2 tablets (40 mg) by mouth every morning and 1 tablet (20 mg) in the evening 09/29/18  Yes Newlin, Enobong, MD  hydrALAZINE (APRESOLINE) 50 MG tablet Take 1.5 tablets (75 mg total) by mouth 3 (three) times daily. Patient taking differently: Take 75 mg by mouth 2 (two) times daily.  12/04/18  Yes Larey Dresser, MD  Insulin Glargine (LANTUS SOLOSTAR) 100 UNIT/ML Solostar Pen Inject 35 Units into the skin every morning. And pen needles 1/day Patient taking differently: Inject 38 Units into the skin daily with breakfast.  07/08/18  Yes Newlin, Charlane Ferretti, MD  isosorbide mononitrate (IMDUR) 30 MG 24 hr tablet Take 1 tablet (30 mg total) by mouth daily.  10/28/18  Yes Larey Dresser, MD  metFORMIN (GLUCOPHAGE) 1000 MG tablet Take 1 tablet (1,000 mg total) by mouth 2 (two) times daily with a meal. 09/29/18  Yes Newlin, Enobong, MD  nitroGLYCERIN (NITROSTAT) 0.4 MG SL tablet Place 1 tablet (0.4 mg total) under the tongue every 5 (five) minutes as needed for chest pain (x 3 tabs daily). Patient taking differently: Place 0.4 mg under the tongue every 5 (five) minutes x 3 doses as needed for chest pain.  09/29/18  Yes Charlott Rakes, MD  rivaroxaban (XARELTO) 20 MG TABS tablet Take 1 tablet (20 mg total) by mouth daily with supper. Patient taking differently: Take 20 mg by mouth daily.  01/27/19  Yes Larey Dresser, MD  sotalol (BETAPACE) 120 MG tablet TAKE 1 TABLET BY MOUTH EVERY 12 HOURS Patient taking differently: Take 120 mg by mouth every 12 (twelve) hours.  04/03/19  Yes Charlott Rakes, MD  ACCU-CHEK SOFTCLIX LANCETS lancets Use as instructed for three times daily testing of blood glucose 09/06/16   Angelica Chessman E, MD  Blood Glucose Monitoring Suppl (ACCU-CHEK AVIVA PLUS) w/Device KIT Use as directed for 3 times daily testing of blood glucose 09/06/16   Tresa Garter, MD  chlorpheniramine-HYDROcodone (TUSSIONEX PENNKINETIC ER) 10-8 MG/5ML SUER Take 5 mLs by mouth every 12 (twelve) hours as needed for cough. Patient not taking: Reported on 04/24/2019 09/11/18   Robyn Haber, MD  cyclobenzaprine (FLEXERIL) 10 MG tablet Take 1 tablet (10 mg total) by mouth at bedtime. Patient not taking: Reported on 04/24/2019 11/05/18   Charlott Rakes, MD  gabapentin (NEURONTIN) 300 MG capsule Take 1 capsule (300 mg total) by mouth at bedtime. Patient not taking: Reported on 04/24/2019 11/05/18   Charlott Rakes, MD  glucose blood (ACCU-CHEK AVIVA PLUS) test strip Use as instructed for 3 times daily testing of blood glucose 09/06/16   Tresa Garter, MD  Lancet Devices [facility]) lancets Use as instructed for 3 times daily testing of blood  glucose 09/06/16   Jegede, Olugbemiga E, MD  ondansetron (ZOFRAN-ODT) 4 MG disintegrating tablet Take 1 tablet (4 mg total) by mouth every 8 (eight) hours as needed for nausea or vomiting. Patient not taking: Reported on 04/24/2019 03/13/19   Zigmund Gottron, NP  TRUEPLUS LANCETS 28G MISC 1 each by Does not apply route 3 (three) times daily. 08/22/16   Tresa Garter, MD    Physical Exam: Vitals:   04/24/19 1523 04/24/19 1747 04/24/19 1800 04/24/19 1915  BP: (!) 163/76 (!) 160/89 (!) 158/89 [name])  160/98  Pulse: 72   79  Resp: '18 18 15 ' (!) 25  Temp: 98.3 F (36.8 C)     TempSrc: Oral     SpO2: 100%   100%  Weight:      Height:        Constitutional: NAD, calm, comfortable Vitals:   04/24/19 1523 04/24/19 1747 04/24/19 1800 04/24/19 1915  BP: (!) 163/76 (!) 160/89 (!) 158/89 (!) 160/98  Pulse: 72   79  Resp: '18 18 15 ' (!) 25  Temp: 98.3 F (36.8 C)     TempSrc: Oral     SpO2: 100%   100%  Weight:      Height:       Eyes: PERRL, lids and conjunctivae normal ENMT: Mucous membranes are moist. Posterior pharynx clear of any exudate or lesions. Had dentures.  Neck: normal, supple, no masses, no thyromegaly Respiratory: clear to auscultation bilaterally, no wheezing, no crackles. Normal respiratory effort. No accessory muscle use.  Cardiovascular: Regular rate and rhythm, 3/6 systolic murmur. No extremity edema. 2+ pedal pulses.No chest wall pain tenderness with palpation.  Abdomen: no tenderness, no masses palpated. No hepatosplenomegaly. Bowel sounds positive. Normal diastasis recti.  Musculoskeletal: no clubbing / cyanosis. No joint deformity upper and lower extremities. Good ROM, no contractures. Normal muscle tone.  Skin: no rashes, lesions, ulcers. No induration Neurologic: CN 2-12 grossly intact. Sensation intact, DTR normal. Strength 5/5 in all 4.  Psychiatric: Normal judgment and insight. Alert and oriented x 3. Normal mood. Very interactive.     Labs on Admission: I  have personally reviewed following labs and imaging studies  CBC: Recent Labs  Lab 04/24/19 1226  WBC 6.8  HGB 8.4*  HCT 28.1*  MCV 84.1  PLT 660   Basic Metabolic Panel: Recent Labs  Lab 04/24/19 1226  NA 138  K 3.5  CL 108  CO2 24  GLUCOSE 124*  BUN 6*  CREATININE 0.98  CALCIUM 8.7*   GFR: Estimated Creatinine Clearance: 82.5 mL/min (by C-G formula based on SCr of 0.98 mg/dL). Liver Function Tests: No results for input(s): AST, ALT, ALKPHOS, BILITOT, PROT, ALBUMIN in the last 168 hours. No results for input(s): LIPASE, AMYLASE in the last 168 hours. No results for input(s): AMMONIA in the last 168 hours. Coagulation Profile: Recent Labs  Lab 04/24/19 1709  INR 1.2   Cardiac Enzymes: No results for input(s): CKTOTAL, CKMB, CKMBINDEX, TROPONINI in the last 168 hours. BNP (last 3 results) No results for input(s): PROBNP in the last 8760 hours. HbA1C: No results for input(s): HGBA1C in the last 72 hours. CBG: No results for input(s): GLUCAP in the last 168 hours. Lipid Profile: No results for input(s): CHOL, HDL, LDLCALC, TRIG, CHOLHDL, LDLDIRECT in the last 72 hours. Thyroid Function Tests: No results for input(s): TSH, T4TOTAL, FREET4, T3FREE, THYROIDAB in the last 72 hours. Anemia Panel: No results for input(s): VITAMINB12, FOLATE, FERRITIN, TIBC, IRON, RETICCTPCT in the last 72 hours. Urine analysis:    Component Value Date/Time   COLORURINE YELLOW 03/23/2019 [place] 03/23/2019 1343   LABSPEC 1.025 03/23/2019 1343   PHURINE 5.5 03/23/2019 1343   GLUCOSEU NEGATIVE 03/23/2019 1343   HGBUR NEGATIVE 03/23/2019 1343   HGBUR moderate 09/27/2010 1646   BILIRUBINUR NEGATIVE 03/23/2019 1343   BILIRUBINUR small (A) 03/13/2019 1620   BILIRUBINUR neg 05/18/2014 [place] 03/23/2019 1343   PROTEINUR >=300 (A) 03/13/2019 1620   PROTEINUR 30 05/18/2014 1239   PROTEINUR NEGATIVE 11/08/2013  1042   UROBILINOGEN 0.2 03/23/2019 1343    NITRITE NEGATIVE 03/23/2019 1343   LEUKOCYTESUR NEGATIVE 03/23/2019 1343    Radiological Exams on Admission: Dg Chest 2 View  Result Date: 04/24/2019 CLINICAL DATA:  Chest pain. EXAM: CHEST - 2 VIEW COMPARISON:  Radiographs of October 19, 2018. FINDINGS: Stable cardiomediastinal silhouette. No pneumothorax or pleural effusion is noted. No acute pulmonary disease is noted. Bony thorax is unremarkable. IMPRESSION: No active cardiopulmonary disease. Electronically Signed   By: Marijo Conception M.D.   On: 04/24/2019 13:04    EKG: Independently reviewed.  Normal sinus rhythm with first-degree AV block and nonspecific T wave changes.  Assessment/Plan Chest pain -Known coronary artery disease with hx of catheterization in 2018 with proximal right and distal RCA stents, 90% stenosis in LAD.  Possibly exacerbated by marked hemoglobin reduction and noncompliant with medication -Cardiology consulted.  Recommends noninvasive management.  Heparin not started in the ER given decrease hemoglobin. -Cardio recommends holding aspirin and Xarelto given reduced hemoglobin -Continue atorvastatin, coreg and sotalol  - will check echo (Last echo in 10/2018- EF of 50-55%)  Normocytic anemia -Presented with hemoglobin of 8.4 today.  This was about 11.5 a month ago and prior to that it was normal.  Patient mentions one episode of dark stool a few days ago but Hemoccult here was negative. - Further workup with Iron panel, retic count, LDH and haptoglobin  Paroxysmal atrial fibrillation -Holding Xarelto given hemoglobin reduction  Type 2 diabetes - Normal takes 38 units of Lantus in the morning - start with 20 units here with sensitive sliding scale  Hypertension -Continue amlodipine, hydralazine, Imdur  Obstructive sleep apnea -CPAP   DVT prophylaxis: SCDS Code Status: Full Family Communication: Discussed with male Best Friend at bedside, and answered all questions from daughters through visual call on  patient's phone. Disposition Plan: Home  Consults called: Cardiology  Admission status: Observation one midnight stay given low-acuity of his medical condition   Jozy Mcphearson T Steel Kerney DO [facility]   If 7PM-7AM, please contact night-coverage [email or web address] Password [facility]  04/24/2019, 8:45 PM

## 2019-04-25 ENCOUNTER — Observation Stay (HOSPITAL_COMMUNITY): Payer: Medicare Other

## 2019-04-25 DIAGNOSIS — E119 Type 2 diabetes mellitus without complications: Secondary | ICD-10-CM | POA: Diagnosis present

## 2019-04-25 DIAGNOSIS — K5730 Diverticulosis of large intestine without perforation or abscess without bleeding: Secondary | ICD-10-CM | POA: Diagnosis present

## 2019-04-25 DIAGNOSIS — F419 Anxiety disorder, unspecified: Secondary | ICD-10-CM | POA: Diagnosis present

## 2019-04-25 DIAGNOSIS — I480 Paroxysmal atrial fibrillation: Secondary | ICD-10-CM

## 2019-04-25 DIAGNOSIS — K648 Other hemorrhoids: Secondary | ICD-10-CM | POA: Diagnosis present

## 2019-04-25 DIAGNOSIS — D649 Anemia, unspecified: Secondary | ICD-10-CM

## 2019-04-25 DIAGNOSIS — K5900 Constipation, unspecified: Secondary | ICD-10-CM | POA: Diagnosis present

## 2019-04-25 DIAGNOSIS — Z8679 Personal history of other diseases of the circulatory system: Secondary | ICD-10-CM | POA: Diagnosis not present

## 2019-04-25 DIAGNOSIS — I5032 Chronic diastolic (congestive) heart failure: Secondary | ICD-10-CM | POA: Diagnosis present

## 2019-04-25 DIAGNOSIS — I361 Nonrheumatic tricuspid (valve) insufficiency: Secondary | ICD-10-CM | POA: Diagnosis not present

## 2019-04-25 DIAGNOSIS — M109 Gout, unspecified: Secondary | ICD-10-CM | POA: Diagnosis present

## 2019-04-25 DIAGNOSIS — I110 Hypertensive heart disease with heart failure: Secondary | ICD-10-CM | POA: Diagnosis present

## 2019-04-25 DIAGNOSIS — G4730 Sleep apnea, unspecified: Secondary | ICD-10-CM | POA: Diagnosis present

## 2019-04-25 DIAGNOSIS — G4733 Obstructive sleep apnea (adult) (pediatric): Secondary | ICD-10-CM | POA: Diagnosis present

## 2019-04-25 DIAGNOSIS — Z20828 Contact with and (suspected) exposure to other viral communicable diseases: Secondary | ICD-10-CM | POA: Diagnosis present

## 2019-04-25 DIAGNOSIS — Z79899 Other long term (current) drug therapy: Secondary | ICD-10-CM | POA: Diagnosis not present

## 2019-04-25 DIAGNOSIS — D509 Iron deficiency anemia, unspecified: Secondary | ICD-10-CM | POA: Diagnosis present

## 2019-04-25 DIAGNOSIS — I2510 Atherosclerotic heart disease of native coronary artery without angina pectoris: Secondary | ICD-10-CM | POA: Diagnosis not present

## 2019-04-25 DIAGNOSIS — K635 Polyp of colon: Secondary | ICD-10-CM | POA: Diagnosis present

## 2019-04-25 DIAGNOSIS — K921 Melena: Secondary | ICD-10-CM | POA: Diagnosis present

## 2019-04-25 DIAGNOSIS — D508 Other iron deficiency anemias: Secondary | ICD-10-CM | POA: Diagnosis not present

## 2019-04-25 DIAGNOSIS — Z8639 Personal history of other endocrine, nutritional and metabolic disease: Secondary | ICD-10-CM | POA: Diagnosis not present

## 2019-04-25 DIAGNOSIS — I25119 Atherosclerotic heart disease of native coronary artery with unspecified angina pectoris: Secondary | ICD-10-CM | POA: Diagnosis present

## 2019-04-25 DIAGNOSIS — I209 Angina pectoris, unspecified: Secondary | ICD-10-CM | POA: Diagnosis not present

## 2019-04-25 DIAGNOSIS — B181 Chronic viral hepatitis B without delta-agent: Secondary | ICD-10-CM | POA: Diagnosis present

## 2019-04-25 DIAGNOSIS — I340 Nonrheumatic mitral (valve) insufficiency: Secondary | ICD-10-CM

## 2019-04-25 DIAGNOSIS — E7800 Pure hypercholesterolemia, unspecified: Secondary | ICD-10-CM | POA: Diagnosis present

## 2019-04-25 DIAGNOSIS — Z86010 Personal history of colonic polyps: Secondary | ICD-10-CM | POA: Diagnosis not present

## 2019-04-25 DIAGNOSIS — K449 Diaphragmatic hernia without obstruction or gangrene: Secondary | ICD-10-CM | POA: Diagnosis present

## 2019-04-25 DIAGNOSIS — R079 Chest pain, unspecified: Secondary | ICD-10-CM | POA: Diagnosis present

## 2019-04-25 DIAGNOSIS — Z794 Long term (current) use of insulin: Secondary | ICD-10-CM | POA: Diagnosis not present

## 2019-04-25 DIAGNOSIS — F1010 Alcohol abuse, uncomplicated: Secondary | ICD-10-CM | POA: Diagnosis present

## 2019-04-25 LAB — ABO/RH: ABO/RH(D): A NEG

## 2019-04-25 LAB — RETICULOCYTES
Immature Retic Fract: 30.7 % — ABNORMAL HIGH (ref 2.3–15.9)
RBC.: 3.35 MIL/uL — ABNORMAL LOW (ref 4.22–5.81)
Retic Count, Absolute: 88.5 10*3/uL (ref 19.0–186.0)
Retic Ct Pct: 2.7 % (ref 0.4–3.1)

## 2019-04-25 LAB — CBC
HCT: 25.5 % — ABNORMAL LOW (ref 39.0–52.0)
Hemoglobin: 7.9 g/dL — ABNORMAL LOW (ref 13.0–17.0)
MCH: 25.2 pg — ABNORMAL LOW (ref 26.0–34.0)
MCHC: 31 g/dL (ref 30.0–36.0)
MCV: 81.2 fL (ref 80.0–100.0)
Platelets: 214 10*3/uL (ref 150–400)
RBC: 3.14 MIL/uL — ABNORMAL LOW (ref 4.22–5.81)
RDW: 15.1 % (ref 11.5–15.5)
WBC: 6.6 10*3/uL (ref 4.0–10.5)
nRBC: 0 % (ref 0.0–0.2)

## 2019-04-25 LAB — IRON AND TIBC
Iron: 23 ug/dL — ABNORMAL LOW (ref 45–182)
Saturation Ratios: 7 % — ABNORMAL LOW (ref 17.9–39.5)
TIBC: 347 ug/dL (ref 250–450)
UIBC: 324 ug/dL

## 2019-04-25 LAB — SARS CORONAVIRUS 2 (TAT 6-24 HRS): SARS Coronavirus 2: NEGATIVE

## 2019-04-25 LAB — HEMOGLOBIN AND HEMATOCRIT, BLOOD
HCT: 25.3 % — ABNORMAL LOW (ref 39.0–52.0)
Hemoglobin: 7.9 g/dL — ABNORMAL LOW (ref 13.0–17.0)

## 2019-04-25 LAB — ECHOCARDIOGRAM COMPLETE
Height: 72 in
Weight: 3104 oz

## 2019-04-25 LAB — GLUCOSE, CAPILLARY
Glucose-Capillary: 163 mg/dL — ABNORMAL HIGH (ref 70–99)
Glucose-Capillary: 279 mg/dL — ABNORMAL HIGH (ref 70–99)
Glucose-Capillary: 197 mg/dL — ABNORMAL HIGH (ref 70–99)
Glucose-Capillary: 121 mg/dL — ABNORMAL HIGH (ref 70–99)
Glucose-Capillary: 213 mg/dL — ABNORMAL HIGH (ref 70–99)

## 2019-04-25 LAB — HIV ANTIBODY (ROUTINE TESTING W REFLEX): HIV Screen 4th Generation wRfx: NONREACTIVE

## 2019-04-25 LAB — FERRITIN: Ferritin: 14 ng/mL — ABNORMAL LOW (ref 24–336)

## 2019-04-25 MED ORDER — ADULT MULTIVITAMIN W/MINERALS CH
1.0000 | ORAL_TABLET | Freq: Every day | ORAL | Status: DC
  Administered 2019-04-25 – 2019-04-28 (×4): 1 via ORAL
  Filled 2019-04-25 (×4): qty 1

## 2019-04-25 MED ORDER — PANTOPRAZOLE SODIUM 40 MG IV SOLR
40.0000 mg | Freq: Two times a day (BID) | INTRAVENOUS | Status: DC
  Administered 2019-04-25 – 2019-04-26 (×4): 40 mg via INTRAVENOUS
  Filled 2019-04-25 (×4): qty 40

## 2019-04-25 MED ORDER — SODIUM CHLORIDE 0.9 % IV SOLN: INTRAVENOUS | Status: DC

## 2019-04-25 MED ORDER — INSULIN ASPART 100 UNIT/ML ~~LOC~~ SOLN
0.0000 [IU] | Freq: Three times a day (TID) | SUBCUTANEOUS | Status: DC
  Administered 2019-04-25: 1 [IU] via SUBCUTANEOUS
  Administered 2019-04-25 (×2): 2 [IU] via SUBCUTANEOUS
  Administered 2019-04-26 (×2): 1 [IU] via SUBCUTANEOUS
  Administered 2019-04-26 – 2019-04-27 (×2): 2 [IU] via SUBCUTANEOUS
  Administered 2019-04-28: 1 [IU] via SUBCUTANEOUS

## 2019-04-25 MED ORDER — SODIUM CHLORIDE 0.9 % IV SOLN
510.0000 mg | Freq: Once | INTRAVENOUS | Status: AC
  Administered 2019-04-25: 510 mg via INTRAVENOUS
  Filled 2019-04-25: qty 17

## 2019-04-25 NOTE — Progress Notes (Signed)
  Echocardiogram 2D Echocardiogram has been performed.  [name] [name] 04/25/2019, 10:51 AM

## 2019-04-25 NOTE — Progress Notes (Signed)
Per RN patient stated that he will not be using the CPAP for the evening. Will continue to monitor patient.

## 2019-04-25 NOTE — Progress Notes (Signed)
Progress Note  Patient Name: [name] [name] Date of Encounter: 04/25/2019  Primary Cardiologist: No primary care provider on file.  Dr. Aundra Dubin  Subjective   No chest pain, no significant shortness of breath getting echocardiogram  Inpatient Medications    Scheduled Meds: . amLODipine  5 mg Oral Daily  . atorvastatin  80 mg Oral Daily  . carvedilol  25 mg Oral BID WC  . feeding supplement (ENSURE ENLIVE)  237 mL Oral BID BM  . furosemide  40 mg Oral Daily   And  . furosemide  20 mg Oral Daily  . hydrALAZINE  75 mg Oral BID  . insulin aspart  0-9 Units Subcutaneous TID WC  . insulin glargine  20 Units Subcutaneous Daily  . isosorbide mononitrate  30 mg Oral Daily  . pantoprazole (PROTONIX) IV  40 mg Intravenous Q12H  . sotalol  120 mg Oral Q12H   Continuous Infusions: . ferumoxytol     PRN Meds: acetaminophen, ondansetron (ZOFRAN) IV   Vital Signs    Vitals:   04/24/19 2307 04/24/19 2310 04/25/19 0559 04/25/19 0943  BP:  (!) 149/100 137/78 130/71  Pulse:  82 72 70  Resp:  14 14 14   Temp:  98.7 F (37.1 C) 98.7 F (37.1 C) 98.6 F (37 C)  TempSrc:  Oral Oral Oral  SpO2:  100% 100% 100%  Weight: 88.2 kg  88 kg   Height: 6' (1.829 m)       Intake/Output Summary (Last 24 hours) at 04/25/2019 1029 Last data filed at 04/25/2019 0825 Gross per 24 hour  Intake 480 ml  Output 150 ml  Net 330 ml   Last 3 Weights 04/25/2019 04/24/2019 04/24/2019  Weight (lbs) 194 lb 194 lb 6.4 oz 197 lb  Weight (kg) 87.998 kg 88.179 kg 89.359 kg      Telemetry    No arrhythmias- Personally Reviewed  ECG    Sinus rhythm LVH repolarization abnormality- Personally Reviewed  Physical Exam   GEN: No acute distress.   Neck: No JVD Cardiac: RRR, no murmurs, rubs, or gallops.  Respiratory: Clear to auscultation bilaterally. GI: Soft, nontender, non-distended  MS: No edema; No deformity. Neuro:  Nonfocal  Psych: Normal affect   Labs    High Sensitivity Troponin:    Recent Labs  Lab 04/24/19 1226 04/24/19 1700  TROPONINIHS 24* 22*      Chemistry Recent Labs  Lab 04/24/19 1226  NA 138  K 3.5  CL 108  CO2 24  GLUCOSE 124*  BUN 6*  CREATININE 0.98  CALCIUM 8.7*  GFRNONAA >60  GFRAA >60  ANIONGAP 6     Hematology Recent Labs  Lab 04/24/19 1226 04/24/19 2316 04/25/19 0626  WBC 6.8  --  6.6  RBC 3.34* 3.35* 3.14*  HGB 8.4*  --  7.9*  HCT 28.1*  --  25.5*  MCV 84.1  --  81.2  MCH 25.1*  --  25.2*  MCHC 29.9*  --  31.0  RDW 15.5  --  15.1  PLT 229  --  214    BNPNo results for input(s): BNP, PROBNP in the last 168 hours.   DDimer No results for input(s): DDIMER in the last 168 hours.   Radiology    Dg Chest 2 View  Result Date: 04/24/2019 CLINICAL DATA:  Chest pain. EXAM: CHEST - 2 VIEW COMPARISON:  Radiographs of October 19, 2018. FINDINGS: Stable cardiomediastinal silhouette. No pneumothorax or pleural effusion is noted. No acute pulmonary disease  is noted. Bony thorax is unremarkable. IMPRESSION: No active cardiopulmonary disease. Electronically Signed   By: Marijo Conception M.D.   On: 04/24/2019 13:04    Cardiac Studies   Cardiac catheterization 2018:   There was diffuse coronary artery disease, similar to prior study in 3/17 but mildly more extensive. No good target for intervention. There were tight stenoses of small to moderate branch vessels off ramus not amenable to intervention. 60% mid RCA probably is not a source of angina. Would not approach the far distal LAD stenosis percutaneously.   At this point, I think he will be best served by aggressive medical management. He needs to make sure he takes all his meds. He needs good diabetes control (not adequate at this point). He needs to make sure BP is under control. He needs to make sure he takes his statin (had been off for a number of weeks). I will add Imdur to his regimen.   In the future, I would not be surprised if he were to need CABG or return with  MI, especially if he does not get his risk factors under control.   Left Main  Mild distal LM narrowing.  Left Anterior Descending  Far distal LAD with 90% stenosis. Diffuse 30-40% stenoses throughout the proximal and mid LAD.  Ramus Intermedius  Large vessel. Two serial 40% proximal ramus stenoses, two serial 50% mid ramus stenoses. Small to moderate 1st branch off ramus with 80% ostial stenosis. Small to moderate 2nd branch off ramus with 90% ostial stenosis.  Left Circumflex  Relatively small LCx with comparatively large ramus. 40% ostial LCx stenosis, 40% proximal LCx stenosis. Luminal irregularities throughout the rest of the vessel.  Right Coronary Artery  Patent proximal and distal RCA stents, distal stent with 30% in-stent restenosis. 60% mid RCA stenosis, mildly progressive compared to prior study.  Intervention  No interventions have been documented.   Echocardiogram today-prelim normal EF  Patient Profile     63 y.o. male with mildly elevated high-sensitivity troponin of 25, flat with anginal symptoms on Xarelto and aspirin, paroxysmal atrial fibrillation with known coronary artery disease last catheterization 2018 with no PCI targets with newly discovered anemia likely GI source  Assessment & Plan    Coronary artery disease with angina - Added isosorbide 30 mg -Holding aspirin and Xarelto because of hemoglobin of 7.9 -GI consult.  Anemia could be exacerbating his anginal symptoms although he has had this type of angina for quite some time treated medically. -No cardiac catheterization necessary.  Last catheterization in 2018 showed diffuse disease with no PCI targets.  Troponin flat currently.  Not ACS.  Anemia -Possible GI source holding Xarelto and aspirin.  He may proceed with colonoscopy or EGD from cardiac perspective with moderate overall risk given his known coronary disease.  Paroxysmal atrial fibrillation - When able, would encourage resumption of  anticoagulation.  Prior cardiomyopathy - EF as low as 26% currently appears normal.  Excellent.  Please let us know if we can be of further assistance.  We will sign off.  Discussed with Dr. Eliseo Squires.     For questions or updates, please contact [place] Please consult [email or web address] for contact info under        Signed, Candee Furbish, MD  04/25/2019, 10:29 AM

## 2019-04-25 NOTE — Progress Notes (Signed)
Initial Nutrition Assessment  DOCUMENTATION CODES:   Obesity unspecified  INTERVENTION:  -Ensure Enlive po BID, each supplement provides 350 kcal and 20 grams of protein -MVI daily   NUTRITION DIAGNOSIS:   Increased nutrient needs related to acute illness as evidenced by estimated needs.   GOAL:   Patient will meet greater than or equal to 90% of their needs   MONITOR:   PO intake, Weight trends, Supplement acceptance, Labs, I & O's  REASON FOR ASSESSMENT:   Consult Poor PO  ASSESSMENT:  RD working remotely.  63 year old male with medical history significant of nonischemic cardiomyopathy, CAD, CHF, atrial flutter, HTN, OSA, cholecystitis s/p cholecystectomy, gout, hep B, and constipation who presented to ED with chest pain  Patient admitted for observation  Unable to reach patient via phone to obtain nutrition history. Per chart review, patient reported taking out the garbage around 4 AM and felt sudden acute onset of stabbing/pressure with pain to mid sternal chest associated with SOB. Pain subsided after laying in bed for approximately an hour, but very diaphoretic; encouraged by daughters to present to ED. CXR showed no acute cardiopulmonary process; EKG NSR with first-degree AV block. Cardiology recommends noninvasive management.  9/12 Echo  GI consult secondary to symptomatic anemia. Patient reported 2 episodes of intermittent black stools; significant drop in hemoglobin over a month noted; suspicious for upper GI bleed; tentative EGD Monday, if unremarkable may need colonoscopy per GI note.  Eating 100% x 1 documented meal Will continue to monitor for meal intake; GI recommendations s/p planned EGD. At this time will order Ensure to assist with calorie and protein needs.   Current wt 88 kg (193.6 lb) Weight history reviewed; noted 12.8 lb wt loss over the past 6 months (6.2% insignificant for time frame) UBW 94-96.7 kg over the past year  Medications reviewed  and include: lipitor, coreg, lasix, SS novolog, lantus, imdur, protonix, betapace  Labs: Ca 8.7 (L) Hemoglobin 7.9 (L) Iron 23 (L) CBGS 131-279 Lab Results  Component Value Date   HGBA1C 6.8 (H) 03/23/2019    NUTRITION - FOCUSED PHYSICAL EXAM: Unable to complete at this time;RD working remotely.  Diet Order:   Diet Order            Diet heart healthy/carb modified Room service appropriate? Yes; Fluid consistency: Thin  Diet effective now              EDUCATION NEEDS:   No education needs have been identified at this time  Skin:  Skin Assessment: Reviewed RN Assessment  Last BM:  9/11  Height:   Ht Readings from Last 1 Encounters:  04/24/19 6' (1.829 m)    Weight:   Wt Readings from Last 1 Encounters:  04/25/19 88 kg    Ideal Body Weight:  80.9 kg  BMI:  Body mass index is 26.31 kg/m.  Estimated Nutritional Needs:   Kcal:  2050-2220  Protein:  103-111  Fluid:  >2L/day   Lajuan Lines, RD, LDN [facility] Telephone [phone] After Hours/Weekend Pager: [phone]

## 2019-04-25 NOTE — Consult Note (Signed)
[facility] Consult  Referring Provider: Geradine Girt, DO Primary Care Physician:  [name], MD Primary Gastroenterologist: Althia Forts  Reason for Consultation: Symptomatic anemia  HPI: [name] [name] is a 63 y.o. male was admitted yesterday with complains of substernal pressure and tightness.  He has a history of nonischemic cardiomyopathy, coronary artery disease, chronic diastolic congestive heart failure, atrial fibrillation on Xarelto(last dose yesterday) and obstructive sleep apnea. He was found to have a hemoglobin of 8.4 on admission which is 7.9 today. Patient states that 2 weeks ago he had abdominal distention and constipation which was resolved with use of prunes and Ensure, however he has noted black stools, few x2 weeks ago and also prior to admission. He denies use of NSAIDs, however drinks almost a bottle of wine a few times a week. Patient had his colonoscopy in 2016 by Dr. Earlean Shawl with removal of colonic polyps. He denies recent unintentional weight loss, loss of appetite, difficulty swallowing or pain on swallowing.   Past Medical History:  Diagnosis Date  . Anginal pain ([place])   . Anxiety   . Arrhythmia   . CAD (coronary artery disease), native coronary artery    a. Nonobstructive CAD by cath 2013 - diffuse distal and branch vessel CAD, no severe disease in the major coronaries, LV mild global hypokinesis, EF 45%. b. ETT-Sestamibi 5/14: EF 31%, small fixed inferior defect with no ischemia.  . Cholecystitis   . Chronic CHF ([place])    a. Mixed ICM/NICM (?EtOH). EF 35% in 2008. Echo 5/13: EF 60-65%, mod LVH, EF 45% on V gram in 12/2011. EF 12/2012: EF 50-55%, mild LVH, inferobasal HK, mild MR. ETT-Ses 5/14 EF 41%. Cardiac MRI 5/14: EF 44%, mild global HK, subepicardial delayed enhancement in nonspecific RV insertion pattern.  . COLONIC POLYPS, HX OF 12/30/2006  . Gout   . H/O atrial flutter 2007   a. Ablations in 2007, 2008.  [name] Heart murmur   .  HEPATITIS B, CHRONIC 12/30/2006  . History of alcohol abuse   . History of hiatal hernia   . History of medication noncompliance   . History of nuclear stress test    Myoview 3/17: + chest pain; EF 33%, downsloping ST depression 2, V4-6, LVH with strain, no ischemia on images; intermediate risk   . HYPERCHOLESTEROLEMIA 07/11/2010  . Hypertension   . Left sciatic nerve pain since 04/2015  . LIVER FUNCTION TESTS, ABNORMAL, HX OF 12/30/2006  . MITRAL REGURGITATION 12/30/2006  . 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 PAF (paroxysmal atrial fibrillation) ([facility])    On coumadin  . Rectal bleeding 12/18/2011   Scheduled for colonoscopy.    . Shortness of breath dyspnea    with excertion  . Sleep apnea    "suppose to send mask but they never did" (05/03/2015)  . Type II diabetes mellitus ([place])     Past Surgical History:  Procedure Laterality Date  . A FLUTTER ABLATION  2007, 2008   catheter ablation   . CARDIAC CATHETERIZATION N/A 05/03/2015   Procedure: Right/Left Heart Cath and Coronary Angiography;  Surgeon: Larey Dresser, MD;  Location: [facility];  Service: Cardiovascular;  Laterality: N/A;  . CARDIAC CATHETERIZATION N/A 05/03/2015   Procedure: Coronary Stent Intervention;  Surgeon: Jettie Booze, MD;  Location: [place] [facility];  Service: Cardiovascular;  Laterality: N/A;  . CARDIAC CATHETERIZATION N/A 11/04/2015   Procedure: Left Heart Cath and Coronary Angiography;  Surgeon: Larey Dresser, MD;  Location: [place] [facility];  Service: Cardiovascular;  Laterality: N/A;  . CARDIOVERSION N/A 08/24/2015   Procedure: CARDIOVERSION;  Surgeon: Thayer Headings, MD;  Location[facility]OSCOPY;  Service: Cardiovascular;  Laterality: N/A;  . CHOLECYSTECTOMY N/A 02/07/2016   Procedure: LAPAROSCOPIC CHOLECYSTECTOMY WITH  INTRAOPERATIVE CHOLANGIOGRAM;  Surgeon: Greer Pickerel, MD;  Location: [facility];  Service: General;  Laterality: N/A;  . CORONARY ANGIOPLASTY  12/05/01  . LEFT  HEART CATH AND CORONARY ANGIOGRAPHY N/A 12/28/2016   Procedure: Left Heart Cath and Coronary Angiography;  Surgeon: Larey Dresser, MD;  Location: [place] [facility];  Service: Cardiovascular;  Laterality: N/A;  . LEFT HEART CATHETERIZATION WITH CORONARY ANGIOGRAM N/A 12/19/2011   Procedure: LEFT HEART CATHETERIZATION WITH CORONARY ANGIOGRAM;  Surgeon: Larey Dresser, MD;  Location: [facility] C[facility] LAB;  Service: Cardiovascular;  Laterality: N/A;  . OSTEOCHONDROMA EXCISION Left 1972   "took bone tumor off my shoulder"    Prior to Admission medications   Medication Sig Start Date End Date Taking? Authorizing Provider  amLODipine (NORVASC) 5 MG tablet Take 1 tablet (5 mg total) by mouth daily. 09/29/18  Yes Charlott Rakes, MD  atorvastatin (LIPITOR) 80 MG tablet Take 1 tablet (80 mg total) by mouth daily. 09/29/18  Yes Charlott Rakes, MD  carvedilol (COREG) 25 MG tablet Take 1 tablet (25 mg total) by mouth 2 (two) times daily with a meal. 09/29/18  Yes Newlin, Enobong, MD  furosemide (LASIX) 20 MG tablet TAKE TWO TABLETS BY MOUTH IN THE MORNING, THEN TAKE ONE IN THE EVENING Patient taking differently: Take 20-40 mg by mouth See admin instructions. Take 2 tablets (40 mg) by mouth every morning and 1 tablet (20 mg) in the evening 09/29/18  Yes Newlin, Enobong, MD  hydrALAZINE (APRESOLINE) 50 MG tablet Take 1.5 tablets (75 mg total) by mouth 3 (three) times daily. Patient taking differently: Take 75 mg by mouth 2 (two) times daily.  12/04/18  Yes Larey Dresser, MD  Insulin Glargine (LANTUS SOLOSTAR) 100 UNIT/ML Solostar Pen Inject 35 Units into the skin every morning. And pen needles 1/day Patient taking differently: Inject 38 Units into the skin daily with breakfast.  07/08/18  Yes Newlin, Charlane Ferretti, MD  isosorbide mononitrate (IMDUR) 30 MG 24 hr tablet Take 1 tablet (30 mg total) by mouth daily. 10/28/18  Yes Larey Dresser, MD  metFORMIN (GLUCOPHAGE) 1000 MG tablet Take 1 tablet (1,000 mg total) by mouth 2  (two) times daily with a meal. 09/29/18  Yes Newlin, Enobong, MD  nitroGLYCERIN (NITROSTAT) 0.4 MG SL tablet Place 1 tablet (0.4 mg total) under the tongue every 5 (five) minutes as needed for chest pain (x 3 tabs daily). Patient taking differently: Place 0.4 mg under the tongue every 5 (five) minutes x 3 doses as needed for chest pain.  09/29/18  Yes Charlott Rakes, MD  rivaroxaban (XARELTO) 20 MG TABS tablet Take 1 tablet (20 mg total) by mouth daily with supper. Patient taking differently: Take 20 mg by mouth daily.  01/27/19  Yes Larey Dresser, MD  sotalol (BETAPACE) 120 MG tablet TAKE 1 TABLET BY MOUTH EVERY 12 HOURS Patient taking differently: Take 120 mg by mouth every 12 (twelve) hours.  04/03/19  Yes Charlott Rakes, MD  ACCU-CHEK SOFTCLIX LANCETS lancets Use as instructed for three times daily testing of blood glucose 09/06/16   Jegede, Olugbemiga E, MD  Blood Glucose Monitoring Suppl (ACCU-CHEK AVIVA PLUS) w/Device KIT Use as directed for 3 times daily testing of blood glucose  09/06/16   Tresa Garter, MD  chlorpheniramine-HYDROcodone (TUSSIONEX PENNKINETIC ER) 10-8 MG/5ML SUER Take 5 mLs by mouth every 12 (twelve) hours as needed for cough. Patient not taking: Reported on 04/24/2019 09/11/18   Robyn Haber, MD  cyclobenzaprine (FLEXERIL) 10 MG tablet Take 1 tablet (10 mg total) by mouth at bedtime. Patient not taking: Reported on 04/24/2019 11/05/18   Charlott Rakes, MD  gabapentin (NEURONTIN) 300 MG capsule Take 1 capsule (300 mg total) by mouth at bedtime. Patient not taking: Reported on 04/24/2019 11/05/18   Charlott Rakes, MD  glucose blood (ACCU-CHEK AVIVA PLUS) test strip Use as instructed for 3 times daily testing of blood glucose 09/06/16   Tresa Garter, MD  Lancet Devices [facility]) lancets Use as instructed for 3 times daily testing of blood glucose 09/06/16   Jegede, Olugbemiga E, MD  ondansetron (ZOFRAN-ODT) 4 MG disintegrating tablet Take 1 tablet (4 mg  total) by mouth every 8 (eight) hours as needed for nausea or vomiting. Patient not taking: Reported on 04/24/2019 03/13/19   Zigmund Gottron, NP  TRUEPLUS LANCETS 28G MISC 1 each by Does not apply route 3 (three) times daily. 08/22/16   Tresa Garter, MD    Current Facility-Administered Medications  Medication Dose Route Frequency Provider Last Rate Last Dose  . acetaminophen (TYLENOL) tablet 650 mg  650 mg Oral Q4H PRN Tu, Ching T, DO      . amLODipine (NORVASC) tablet 5 mg  5 mg Oral Daily Tu, Ching T, DO   5 mg at 04/25/19 0944  . atorvastatin (LIPITOR) tablet 80 mg  80 mg Oral Daily Tu, Ching T, DO   80 mg at 04/25/19 0944  . carvedilol (COREG) tablet 25 mg  25 mg Oral BID WC Tu, Ching T, DO   25 mg at 04/25/19 0945  . feeding supplement (ENSURE ENLIVE) (ENSURE ENLIVE) liquid 237 mL  237 mL Oral BID BM Tu, Ching T, DO   237 mL at 04/25/19 0950  . ferumoxytol (FERAHEME) 510 mg in sodium chloride 0.9 % 100 mL IVPB  510 mg Intravenous Once Vann, Jessica U, DO      . furosemide (LASIX) tablet 40 mg  40 mg Oral Daily Tu, Ching T, DO   40 mg at 04/25/19 0947   And  . furosemide (LASIX) tablet 20 mg  20 mg Oral Daily Tu, Ching T, DO      . hydrALAZINE (APRESOLINE) tablet 75 mg  75 mg Oral BID Tu, Ching T, DO   75 mg at 04/25/19 0943  . insulin aspart (novoLOG) injection 0-9 Units  0-9 Units Subcutaneous TID WC Eulogio Bear U, DO   2 Units at 04/25/19 0641  . insulin glargine (LANTUS) injection 20 Units  20 Units Subcutaneous Daily Tu, Ching T, DO   20 Units at 04/25/19 0946  . isosorbide mononitrate (IMDUR) 24 hr tablet 30 mg  30 mg Oral Daily Tu, Ching T, DO   30 mg at 04/25/19 0945  . ondansetron (ZOFRAN) injection 4 mg  4 mg Intravenous Q6H PRN Tu, Ching T, DO      . pantoprazole (PROTONIX) injection 40 mg  40 mg Intravenous Q12H Vann, Jessica U, DO      . sotalol (BETAPACE) tablet 120 mg  120 mg Oral Q12H Tu, Ching T, DO   120 mg at 04/25/19 0946    Allergies as of 04/24/2019 -  Review Complete 04/24/2019  Allergen Reaction Noted  . Shrimp [shellfish allergy] Nausea  And Vomiting 10/19/2018  . Ace inhibitors Cough 10/16/2013  . Other Hives and Other (See Comments) 12/20/2011    Family History  Problem Relation Age of Onset  . Diabetes Mother   . Hypertension Mother   . Heart attack Neg Hx   . Stroke Neg Hx     Social History   Socioeconomic History  . Marital status: Widowed    Spouse name: Not on file  . Number of children: 2  . Years of education: Not on file  . Highest education level: Not on file  Occupational History    Employer: UNEMPLOYED  Social Needs  . Financial resource strain: Not on file  . Food insecurity    Worry: Not on file    Inability: Not on file  . Transportation needs    Medical: Not on file    Non-medical: Not on file  Tobacco Use  . Smoking status: Former Smoker    Packs/day: 4.00    Years: 18.00    Pack years: 72.00    Types: Cigarettes    Quit date: 08/14/1983    Years since quitting: 35.7  . Smokeless tobacco: Never Used  Substance and Sexual Activity  . Alcohol use: Yes    Alcohol/week: 16.0 standard drinks    Types: 6 Cans of beer, 10 Shots of liquor per week    Comment: occasionally  . Drug use: Yes    Types: Marijuana    Comment: occasionally  . Sexual activity: Never  Lifestyle  . Physical activity    Days per week: Not on file    Minutes per session: Not on file  . Stress: Not on file  Relationships  . Social Herbalist on phone: Not on file    Gets together: Not on file    Attends religious service: Not on file    Active member of club or organization: Not on file    Attends meetings of clubs or organizations: Not on file    Relationship status: Not on file  . Intimate partner violence    Fear of current or ex partner: Not on file    Emotionally abused: Not on file    Physically abused: Not on file    Forced sexual activity: Not on file  Other Topics Concern  . Not on file  Social  History Narrative  . Not on file    Review of Systems: Positive for: GI: Described in detail in HPI.    Gen: fatigue, weakness, malaise,Denies any fever, chills, rigors, night sweats, anorexia,  involuntary weight loss, and sleep disorder GY:IRSWN pain, angina,  Denies palpitations, syncope, orthopnea, PND, peripheral edema, and claudication. Resp: Denies dyspnea, cough, sputum, wheezing, coughing up blood. GU : Denies urinary burning, blood in urine, urinary frequency, urinary hesitancy, nocturnal urination, and urinary incontinence. MS: Denies joint pain or swelling.  Denies muscle weakness, cramps, atrophy.  Derm: Denies rash, itching, oral ulcerations, hives, unhealing ulcers.  Psych: Denies depression, anxiety, memory loss, suicidal ideation, hallucinations,  and confusion. Heme: Denies bruising, bleeding, and enlarged lymph nodes. Neuro:  Denies any headaches, dizziness, paresthesias. Endo:  DM, Denies any problems with , thyroid, adrenal function.  Physical Exam: Vital signs in last 24 hours: Temp:  [98.3 F (36.8 C)-98.9 F (37.2 C)] 98.6 F (37 C) (09/12 0943) Pulse Rate:  [70-94] 70 (09/12 0943) Resp:  [14-30] 14 (09/12 0943) BP: (130-163)/(71-100) 130/71 (09/12 0943) SpO2:  [98 %-100 %] 100 % (09/12 0943) Weight:  [46  kg-89.4 kg] 88 kg (09/12 0559) Last BM Date: 04/24/19  General:   Alert,  Well-developed, well-nourished, pleasant and cooperative in NAD Head:  Normocephalic and atraumatic. Eyes:  Sclera clear, no icterus.   Prominent pallor Ears:  Normal auditory acuity. Nose:  No deformity, discharge,  or lesions. Mouth:  No deformity or lesions.  Oropharynx pink & moist. Neck:  Supple; no masses or thyromegaly. Lungs:  Clear throughout to auscultation.   No wheezes, crackles, or rhonchi. No acute distress. Heart:  Regular rate and rhythm; no murmurs, clicks, rubs,  or gallops. Extremities:  Without clubbing or edema. Neurologic:  Alert and  oriented x4;  grossly  normal neurologically. Skin:  Intact without significant lesions or rashes. Psych:  Alert and cooperative. Normal mood and affect. Abdomen:  Soft, nontender and nondistended. No masses, hepatosplenomegaly or hernias noted. Normal bowel sounds, without guarding, and without rebound.         Lab Results: Recent Labs    04/24/19 1226 04/25/19 0626  WBC 6.8 6.6  HGB 8.4* 7.9*  HCT 28.1* 25.5*  PLT 229 214   BMET Recent Labs    04/24/19 1226  NA 138  K 3.5  CL 108  CO2 24  GLUCOSE 124*  BUN 6*  CREATININE 0.98  CALCIUM 8.7*   LFT No results for input(s): PROT, ALBUMIN, AST, ALT, ALKPHOS, BILITOT, BILIDIR, IBILI in the last 72 hours. PT/INR Recent Labs    04/24/19 1709  LABPROT 15.4*  INR 1.2    Studies/Results: Dg Chest 2 View  Result Date: 04/24/2019 CLINICAL DATA:  Chest pain. EXAM: CHEST - 2 VIEW COMPARISON:  Radiographs of October 19, 2018. FINDINGS: Stable cardiomediastinal silhouette. No pneumothorax or pleural effusion is noted. No acute pulmonary disease is noted. Bony thorax is unremarkable. IMPRESSION: No active cardiopulmonary disease. Electronically Signed   By: Marijo Conception M.D.   On: 04/24/2019 13:04    Impression: Chest pain, elevated troponin I, significant drop in hemoglobin, intermittent black stools Suspicious for upper GI bleed, although BUN/creatinine ratio is within normal limits. Patient also noted to have iron deficiency, ferritin 2014 iron saturation 7% Hemodynamically stable and has not required blood transfusion Last dose of Xarelto was yesterday, patient got 325 mg of aspirin as well  Plan: Patient is on regular diet, plan EGD on Monday, if unremarkable may need a colonoscopy thereafter. Discussed about the risk and the benefits of the procedure with the patient and his daughter over the phone, he verbalized understanding and consent. Patient is on pantoprazole twice a day, and to keep him n.p.o. post midnight on 04/26/2019.   LOS: 0  days   Gari Crown  04/25/2019, 11:43 AM  Pager [phone] If no answer or after 5 PM call [phone]

## 2019-04-25 NOTE — Progress Notes (Addendum)
Progress Note    [name] [name]  [id number] DOB: 1955-12-06  DOA: 04/24/2019 PCP: [name], MD    Brief Narrative:     Medical records reviewed and are as summarized below:  [name] [name] is an 63 y.o. male with medical history significant of nonischemic cardiomyopathy, CAD, chronic diastolic CHF, atrial flutter on Xarelto, hypertension, OSA who presents with chest pain.  Patient reports that around 4 AM early this morning he was taking out the garbage and felt a sudden acute onset of stabbing/pressure mid sternal chest pain with associated shortness of breath.  Assessment/Plan:   Active Problems:   Obstructive sleep apnea   Chest pain   History of atrial fibrillation   History of type 2 diabetes mellitus   Anemia   Chest pain -Known coronary artery disease with hx of catheterization in 2018 with proximal right and distal RCA stents, 90% stenosis in LAD.  Possibly exacerbated by marked hemoglobin reduction -Cardiology consulted.  Recommends noninvasive management- consult much appreciated -Cardio recommends holding aspirin and Xarelto given reduced hemoglobin -Continue atorvastatin, coreg and sotalol  - will check echo (Last echo in 10/2018- EF of 50-55%)  Normocytic anemia -reported Hemoccult here was negative but no documentation under results -iron levels low -IV Fe -LDH normal, haptoglobin pending -2 recent episodes of dark stools -last colonoscopy was 2016 by Dr. Marily Memos (due again in 2021- 5 years) - GI consult for ? EGD +/- colonoscopy: patient denies NSAIDS, does drink alcohol -IV protonix  Paroxysmal atrial fibrillation -Holding Xarelto given hemoglobin reduction  Type 2 diabetes - Normal takes 38 units of Lantus in the morning - start with 20 units here with sensitive sliding scale  Hypertension -Continue amlodipine, hydralazine, Imdur  Obstructive sleep apnea -CPAP (patient refused last PM)  Alcohol use- monitor for  withdrawals   Family Communication/Anticipated D/C date and plan/Code Status   DVT prophylaxis: scd Code Status: Full Code.  Family Communication: spoke with both daughters on the phone Disposition Plan: GI Consult as patient with marked drop in Hgb over a month-- he is high risk due to cardiac history if Hgb continues to drop Late entry: plan for EGD on Monday and possible colonoscopy after  Medical Consultants:    Cards  GI  Subjective:   Had 2 episodes of dark stools- last week and 2 weeks ago  Objective:    Vitals:   04/24/19 2307 04/24/19 2310 04/25/19 0559 04/25/19 0943  BP:  (!) 149/100 137/78 130/71  Pulse:  82 72 70  Resp:  14 14 14   Temp:  98.7 F (37.1 C) 98.7 F (37.1 C) 98.6 F (37 C)  TempSrc:  Oral Oral Oral  SpO2:  100% 100% 100%  Weight: 88.2 kg  88 kg   Height: 6' (1.829 m)       Intake/Output Summary (Last 24 hours) at 04/25/2019 1030 Last data filed at 04/25/2019 0825 Gross per 24 hour  Intake 480 ml  Output 150 ml  Net 330 ml   Filed Weights   04/24/19 1205 04/24/19 2307 04/25/19 0559  Weight: 89.4 kg 88.2 kg 88 kg    Exam: In bed, well appearing A+Ox3 No wheezing, no increased work of breathing +BS, soft, NT  Data Reviewed:   I have personally reviewed following labs and imaging studies:  Labs: Labs show the following:   Basic Metabolic Panel: Recent Labs  Lab 04/24/19 1226  NA 138  K 3.5  CL 108  CO2 24  GLUCOSE  124*  BUN 6*  CREATININE 0.98  CALCIUM 8.7*   GFR Estimated Creatinine Clearance: 82.5 mL/min (by C-G formula based on SCr of 0.98 mg/dL). Liver Function Tests: No results for input(s): AST, ALT, ALKPHOS, BILITOT, PROT, ALBUMIN in the last 168 hours. No results for input(s): LIPASE, AMYLASE in the last 168 hours. No results for input(s): AMMONIA in the last 168 hours. Coagulation profile Recent Labs  Lab 04/24/19 1709  INR 1.2    CBC: Recent Labs  Lab 04/24/19 1226 04/25/19 0626  WBC 6.8 6.6   HGB 8.4* 7.9*  HCT 28.1* 25.5*  MCV 84.1 81.2  PLT 229 214   Cardiac Enzymes: No results for input(s): CKTOTAL, CKMB, CKMBINDEX, TROPONINI in the last 168 hours. BNP (last 3 results) No results for input(s): PROBNP in the last 8760 hours. CBG: Recent Labs  Lab 04/24/19 2317 04/25/19 0619  GLUCAP 131* 163*   D-Dimer: No results for input(s): DDIMER in the last 72 hours. Hgb A1c: No results for input(s): HGBA1C in the last 72 hours. Lipid Profile: No results for input(s): CHOL, HDL, LDLCALC, TRIG, CHOLHDL, LDLDIRECT in the last 72 hours. Thyroid function studies: No results for input(s): TSH, T4TOTAL, T3FREE, THYROIDAB in the last 72 hours.  Invalid input(s): FREET3 Anemia work up: Recent Labs    04/24/19 2316  FERRITIN 14*  TIBC 347  IRON 23*  RETICCTPCT 2.7   Sepsis Labs: Recent Labs  Lab 04/24/19 1226 04/25/19 0626  WBC 6.8 6.6    Microbiology Recent Results (from the past 240 hour(s))  SARS CORONAVIRUS 2 (TAT 6-24 HRS) Nasopharyngeal Nasopharyngeal Swab     Status: None   Collection Time: 04/24/19  8:45 PM   Specimen: Nasopharyngeal Swab  Result Value Ref Range Status   SARS Coronavirus 2 NEGATIVE NEGATIVE Final    Comment: (NOTE) SARS-CoV-2 target nucleic acids are NOT DETECTED. The SARS-CoV-2 RNA is generally detectable in upper and lower respiratory specimens during the acute phase of infection. Negative results do not preclude SARS-CoV-2 infection, do not rule out co-infections with other pathogens, and should not be used as the sole basis for treatment or other patient management decisions. Negative results must be combined with clinical observations, patient history, and epidemiological information. The expected result is Negative. Fact Sheet for Patients: SugarRoll.be Fact Sheet for Healthcare Providers: https://www.woods-mathews.com/ This test is not yet approved or cleared by the [place] FDA and   has been authorized for detection and/or diagnosis of SARS-CoV-2 by FDA under an Emergency Use Authorization (EUA). This EUA will remain  in effect (meaning this test can be used) for the duration of the COVID-19 declaration under Section 56 4(b)(1) of the Act, 21 U.S.C. section 360bbb-3(b)(1), unless the authorization is terminated or revoked sooner. Performed at [facility], [place] [address] [place], [place] [address]     Procedures and diagnostic studies:  Dg Chest 2 View  Result Date: 04/24/2019 CLINICAL DATA:  Chest pain. EXAM: CHEST - 2 VIEW COMPARISON:  Radiographs of October 19, 2018. FINDINGS: Stable cardiomediastinal silhouette. No pneumothorax or pleural effusion is noted. No acute pulmonary disease is noted. Bony thorax is unremarkable. IMPRESSION: No active cardiopulmonary disease. Electronically Signed   By: Marijo Conception M.D.   On: 04/24/2019 13:04    Medications:   . amLODipine  5 mg Oral Daily  . atorvastatin  80 mg Oral Daily  . carvedilol  25 mg Oral BID WC  . feeding supplement (ENSURE ENLIVE)  237 mL Oral BID BM  .  furosemide  40 mg Oral Daily   And  . furosemide  20 mg Oral Daily  . hydrALAZINE  75 mg Oral BID  . insulin aspart  0-9 Units Subcutaneous TID WC  . insulin glargine  20 Units Subcutaneous Daily  . isosorbide mononitrate  30 mg Oral Daily  . pantoprazole (PROTONIX) IV  40 mg Intravenous Q12H  . sotalol  120 mg Oral Q12H   Continuous Infusions: . ferumoxytol       LOS: 0 days   Geradine Girt  [facility]   How to contact the [facility] Attending or Consulting provider [place] or covering provider during after hours [place], for this patient?  1. Check the care team in [facility] and look for a) attending/consulting [facility] provider listed and b) the [facility] team listed 2. Log into [email or web address] and use [place]'s universal password to access. If you do not have the password, please contact the hospital operator. 3. Locate the [facility] provider you  are looking for under [facility] and page to a number that you can be directly reached. 4. If you still have difficulty reaching the provider, please page the [facility] (Director on Call) for the Hospitalists listed on amion for assistance.  04/25/2019, 10:30 AM

## 2019-04-26 LAB — GLUCOSE, CAPILLARY
Glucose-Capillary: 139 mg/dL — ABNORMAL HIGH (ref 70–99)
Glucose-Capillary: 148 mg/dL — ABNORMAL HIGH (ref 70–99)
Glucose-Capillary: 165 mg/dL — ABNORMAL HIGH (ref 70–99)
Glucose-Capillary: 205 mg/dL — ABNORMAL HIGH (ref 70–99)

## 2019-04-26 LAB — BASIC METABOLIC PANEL
Anion gap: 8 (ref 5–15)
BUN: 11 mg/dL (ref 8–23)
CO2: 23 mmol/L (ref 22–32)
Calcium: 8.7 mg/dL — ABNORMAL LOW (ref 8.9–10.3)
Chloride: 107 mmol/L (ref 98–111)
Creatinine, Ser: 1.03 mg/dL (ref 0.61–1.24)
GFR calc Af Amer: >60 mL/min (ref >60)
GFR calc non Af Amer: >60 mL/min (ref >60)
Glucose, Bld: 167 mg/dL — ABNORMAL HIGH (ref 70–99)
Potassium: 3.7 mmol/L (ref 3.5–5.1)
Sodium: 138 mmol/L (ref 135–145)

## 2019-04-26 LAB — CBC
HCT: 26 % — ABNORMAL LOW (ref 39.0–52.0)
Hemoglobin: 8.1 g/dL — ABNORMAL LOW (ref 13.0–17.0)
MCH: 25.2 pg — ABNORMAL LOW (ref 26.0–34.0)
MCHC: 31.2 g/dL (ref 30.0–36.0)
MCV: 81 fL (ref 80.0–100.0)
Platelets: 232 10*3/uL (ref 150–400)
RBC: 3.21 MIL/uL — ABNORMAL LOW (ref 4.22–5.81)
RDW: 15.1 % (ref 11.5–15.5)
WBC: 6.6 10*3/uL (ref 4.0–10.5)
nRBC: 0 % (ref 0.0–0.2)

## 2019-04-26 LAB — HAPTOGLOBIN: Haptoglobin: 220 mg/dL (ref 32–363)

## 2019-04-26 MED ORDER — SODIUM CHLORIDE 0.9 % IV SOLN
INTRAVENOUS | Status: DC
  Administered 2019-04-26: 22:00:00 via INTRAVENOUS

## 2019-04-26 NOTE — Plan of Care (Signed)

## 2019-04-26 NOTE — Anesthesia Preprocedure Evaluation (Addendum)
Anesthesia Evaluation  Patient identified by MRN, date of birth, ID band Patient awake    Reviewed: Allergy & Precautions, NPO status , Patient's Chart, lab work & pertinent test results, reviewed documented beta blocker date and time   History of Anesthesia Complications Negative for: history of anesthetic complications  Airway Mallampati: II  TM Distance: >3 FB Neck ROM: Full    Dental  (+) Edentulous Lower, Edentulous Upper   Pulmonary sleep apnea , former smoker,    Pulmonary exam normal        Cardiovascular hypertension, Pt. on home beta blockers and Pt. on medications + angina + CAD, + Cardiac Stents and +CHF  Normal cardiovascular exam+ dysrhythmias Atrial Fibrillation + Valvular Problems/Murmurs MR    '20 TTE - EF 60-65%. There is moderately increased left ventricular wall thickness. Left ventricular diastolic Doppler parameters are consistent with pseudonormalization. Left atrial size was moderately dilated. Mild-moderate MR. Mild AI.     Neuro/Psych PSYCHIATRIC DISORDERS Anxiety negative neurological ROS     GI/Hepatic hiatal hernia, (+) Hepatitis -, B  Endo/Other  diabetes, Type 2, Insulin Dependent, Oral Hypoglycemic Agents  Renal/GU negative Renal ROS     Musculoskeletal negative musculoskeletal ROS (+)   Abdominal   Peds  Hematology  (+) anemia ,   Anesthesia Other Findings   Reproductive/Obstetrics                            Anesthesia Physical Anesthesia Plan  ASA: III  Anesthesia Plan: MAC   Post-op Pain Management:    Induction: Intravenous  PONV Risk Score and Plan: 1 and Propofol infusion and Treatment may vary due to age or medical condition  Airway Management Planned: Nasal Cannula and Natural Airway  Additional Equipment: None  Intra-op Plan:   Post-operative Plan:   Informed Consent: I have reviewed the patients History and Physical, chart, labs and  discussed the procedure including the risks, benefits and alternatives for the proposed anesthesia with the patient or authorized representative who has indicated his/her understanding and acceptance.       Plan Discussed with: CRNA and Anesthesiologist  Anesthesia Plan Comments:        Anesthesia Quick Evaluation

## 2019-04-26 NOTE — H&P (View-Only) (Signed)
Subjective: The patient was seen and examined at bedside. Reports having a brown-colored stool today morning, able to tolerate regular diet, denies nausea, vomiting or abdominal pain.  Objective: Vital signs in last 24 hours: Temp:  [98.2 F (36.8 C)-98.6 F (37 C)] 98.6 F (37 C) (09/13 0917) Pulse Rate:  [67-74] 71 (09/13 0917) Resp:  [13-18] 18 (09/13 0917) BP: (116-133)/(63-78) 121/71 (09/13 0917) SpO2:  [94 %-100 %] 100 % (09/13 0917) Weight:  [87.7 kg] 87.7 kg (09/13 0419) Weight change: -1.678 kg Last BM Date: 04/25/19  PE: Appears comfortable GENERAL: Mild pallor ABDOMEN: Nondistended EXTREMITIES: No deformity  Lab Results: Results for orders placed or performed during the hospital encounter of 04/24/19 (from the past 48 hour(s))  Basic metabolic panel     Status: Abnormal   Collection Time: 04/24/19 12:26 PM  Result Value Ref Range   Sodium 138 135 - 145 mmol/L   Potassium 3.5 3.5 - 5.1 mmol/L   Chloride 108 98 - 111 mmol/L   CO2 24 22 - 32 mmol/L   Glucose, Bld 124 (H) 70 - 99 mg/dL   BUN 6 (L) 8 - 23 mg/dL   Creatinine, Ser 0.98 0.61 - 1.24 mg/dL   Calcium 8.7 (L) 8.9 - 10.3 mg/dL   GFR calc non Af Amer >60 >60 mL/min   GFR calc Af Amer >60 >60 mL/min   Anion gap 6 5 - 15    Comment: Performed at [facility], [place] [address], [place], [place] [address]  CBC     Status: Abnormal   Collection Time: 04/24/19 12:26 PM  Result Value Ref Range   WBC 6.8 4.0 - 10.5 K/uL   RBC 3.34 (L) 4.22 - 5.81 MIL/uL   Hemoglobin 8.4 (L) 13.0 - 17.0 g/dL   HCT 28.1 (L) 39.0 - 52.0 %   MCV 84.1 80.0 - 100.0 fL   MCH 25.1 (L) 26.0 - 34.0 pg   MCHC 29.9 (L) 30.0 - 36.0 g/dL   RDW 15.5 11.5 - 15.5 %   Platelets 229 150 - 400 K/uL   nRBC 0.0 0.0 - 0.2 %    Comment: Performed at [facility], [place] [address] [place], [place] [address]  Troponin I (High Sensitivity)     Status: Abnormal   Collection Time: 04/24/19 12:26 PM  Result Value Ref Range   Troponin I (High  Sensitivity) 24 (H) <18 ng/L    Comment: (NOTE) Elevated high sensitivity troponin I (hsTnI) values and significant  changes across serial measurements may suggest ACS but many other  chronic and acute conditions are known to elevate hsTnI results.  Refer to the "Links" section for chest pain algorithms and additional  guidance. Performed at [facility], [place] [address], [place], [place] [address]   Troponin I (High Sensitivity)     Status: Abnormal   Collection Time: 04/24/19  5:00 PM  Result Value Ref Range   Troponin I (High Sensitivity) 22 (H) <18 ng/L    Comment: (NOTE) Elevated high sensitivity troponin I (hsTnI) values and significant  changes across serial measurements may suggest ACS but many other  chronic and acute conditions are known to elevate hsTnI results.  Refer to the "Links" section for chest pain algorithms and additional  guidance. Performed at [facility], [place] [address] [place],  [address]   Protime-INR     Status: Abnormal   Collection Time: 04/24/19  5:09 PM  Result Value Ref Range  Prothrombin Time 15.4 (H) 11.4 - 15.2 seconds   INR 1.2 0.8 - 1.2    Comment: (NOTE) INR goal varies based on device and disease states. Performed at [facility], [place] [address], [place], [place] [address]   Type and screen [place]     Status: None   Collection Time: 04/24/19  8:41 PM  Result Value Ref Range   ABO/RH(D) A NEG    Antibody Screen NEG    Sample Expiration      04/27/2019,2359 Performed at [facility], [place] [address] [place], [place] [address]   ABO/Rh     Status: None   Collection Time: 04/24/19  8:41 PM  Result Value Ref Range   ABO/RH(D)      A NEG Performed at [facility] [address], [place], [place] [address]   SARS CORONAVIRUS 2 (TAT 6-24 HRS) Nasopharyngeal Nasopharyngeal Swab     Status: None   Collection Time: 04/24/19  8:45 PM   Specimen: Nasopharyngeal Swab  Result  Value Ref Range   SARS Coronavirus 2 NEGATIVE NEGATIVE    Comment: (NOTE) SARS-CoV-2 target nucleic acids are NOT DETECTED. The SARS-CoV-2 RNA is generally detectable in upper and lower respiratory specimens during the acute phase of infection. Negative results do not preclude SARS-CoV-2 infection, do not rule out co-infections with other pathogens, and should not be used as the sole basis for treatment or other patient management decisions. Negative results must be combined with clinical observations, patient history, and epidemiological information. The expected result is Negative. Fact Sheet for Patients: SugarRoll.be Fact Sheet for Healthcare Providers: https://www.woods-mathews.com/ This test is not yet approved or cleared by the [place] FDA and  has been authorized for detection and/or diagnosis of SARS-CoV-2 by FDA under an Emergency Use Authorization (EUA). This EUA will remain  in effect (meaning this test can be used) for the duration of the COVID-19 declaration under Section 56 4(b)(1) of the Act, 21 U.S.C. section 360bbb-3(b)(1), unless the authorization is terminated or revoked sooner. Performed at [facility], [place] [address] [place], [place] [address]   HIV antibody (Routine Testing)     Status: None   Collection Time: 04/24/19  8:48 PM  Result Value Ref Range   HIV Screen 4th Generation wRfx Non Reactive Non Reactive    Comment: (NOTE) Performed At: [facility] [place], [place] [id number] Rush Farmer MD [id number]   Reticulocytes     Status: Abnormal   Collection Time: 04/24/19 11:16 PM  Result Value Ref Range   Retic Ct Pct 2.7 0.4 - 3.1 %    Comment: RESULTS CONFIRMED BY MANUAL DILUTION   RBC. 3.35 (L) 4.22 - 5.81 MIL/uL   Retic Count, Absolute 88.5 19.0 - 186.0 K/uL   Immature Retic Fract 30.7 (H) 2.3 - 15.9 %    Comment: Performed at [facility] [address], [place], [place] [address]  Iron and TIBC     Status: Abnormal   Collection Time: 04/24/19 11:16 PM  Result Value Ref Range   Iron 23 (L) 45 - 182 ug/dL   TIBC 347 250 - 450 ug/dL   Saturation Ratios 7 (L) 17.9 - 39.5 %   UIBC 324 ug/dL    Comment: Performed at [facility], [place] [address] [place], [place] [address]  Ferritin     Status: Abnormal   Collection Time: 04/24/19 11:16 PM  Result Value  Ref Range   Ferritin 14 (L) 24 - 336 ng/mL    Comment: Performed at [facility], [place] [address], [place], [place] [address]  Lactate dehydrogenase     Status: None   Collection Time: 04/24/19 11:16 PM  Result Value Ref Range   LDH 124 98 - 192 U/L    Comment: Performed at [facility], [place] [address] [place], [place] [address]  Glucose, capillary     Status: Abnormal   Collection Time: 04/24/19 11:17 PM  Result Value Ref Range   Glucose-Capillary 131 (H) 70 - 99 mg/dL   Comment 1 Notify RN    Comment 2 Document in Chart   Glucose, capillary     Status: Abnormal   Collection Time: 04/25/19  6:19 AM  Result Value Ref Range   Glucose-Capillary 163 (H) 70 - 99 mg/dL   Comment 1 Notify RN    Comment 2 Document in Chart   CBC     Status: Abnormal   Collection Time: 04/25/19  6:26 AM  Result Value Ref Range   WBC 6.6 4.0 - 10.5 K/uL   RBC 3.14 (L) 4.22 - 5.81 MIL/uL   Hemoglobin 7.9 (L) 13.0 - 17.0 g/dL   HCT 25.5 (L) 39.0 - 52.0 %   MCV 81.2 80.0 - 100.0 fL   MCH 25.2 (L) 26.0 - 34.0 pg   MCHC 31.0 30.0 - 36.0 g/dL   RDW 15.1 11.5 - 15.5 %   Platelets 214 150 - 400 K/uL   nRBC 0.0 0.0 - 0.2 %    Comment: Performed at [facility], [place] [address], [place], [place] [address]  Glucose, capillary     Status: Abnormal   Collection Time: 04/25/19 11:25 AM  Result Value Ref Range   Glucose-Capillary 279 (H) 70 - 99 mg/dL  Glucose, capillary     Status: Abnormal   Collection Time: 04/25/19  1:12 PM  Result Value Ref Range   Glucose-Capillary 197 (H) 70 - 99  mg/dL  Glucose, capillary     Status: Abnormal   Collection Time: 04/25/19  4:15 PM  Result Value Ref Range   Glucose-Capillary 121 (H) 70 - 99 mg/dL  Hemoglobin and hematocrit, blood     Status: Abnormal   Collection Time: 04/25/19  4:28 PM  Result Value Ref Range   Hemoglobin 7.9 (L) 13.0 - 17.0 g/dL   HCT 25.3 (L) 39.0 - 52.0 %    Comment: Performed at [facility], [place] [address] [place], [place] [address]  Glucose, capillary     Status: Abnormal   Collection Time: 04/25/19  9:09 PM  Result Value Ref Range   Glucose-Capillary 213 (H) 70 - 99 mg/dL   Comment 1 Notify RN    Comment 2 Document in Chart   CBC     Status: Abnormal   Collection Time: 04/26/19  5:22 AM  Result Value Ref Range   WBC 6.6 4.0 - 10.5 K/uL   RBC 3.21 (L) 4.22 - 5.81 MIL/uL   Hemoglobin 8.1 (L) 13.0 - 17.0 g/dL   HCT 26.0 (L) 39.0 - 52.0 %   MCV 81.0 80.0 - 100.0 fL   MCH 25.2 (L) 26.0 - 34.0 pg   MCHC 31.2 30.0 - 36.0 g/dL   RDW 15.1 11.5 - 15.5 %   Platelets 232 150 - 400 K/uL   nRBC 0.0 0.0 - 0.2 %    Comment: Performed at [facility],  [address] [place], [place] [id number]  Basic metabolic panel     Status: Abnormal   Collection Time: 04/26/19  5:22 AM  Result Value Ref Range   Sodium 138 135 - 145 mmol/L   Potassium 3.7 3.5 - 5.1 mmol/L   Chloride 107 98 - 111 mmol/L   CO2 23 22 - 32 mmol/L   Glucose, Bld 167 (H) 70 - 99 mg/dL   BUN 11 8 - 23 mg/dL   Creatinine, Ser 1.03 0.61 - 1.24 mg/dL   Calcium 8.7 (L) 8.9 - 10.3 mg/dL   GFR calc non Af Amer >60 >60 mL/min   GFR calc Af Amer >60 >60 mL/min   Anion gap 8 5 - 15    Comment: Performed at [facility] [address], [place], [place] [address]  Glucose, capillary     Status: Abnormal   Collection Time: 04/26/19  6:20 AM  Result Value Ref Range   Glucose-Capillary 139 (H) 70 - 99 mg/dL    Studies/Results: Dg Chest 2 View  Result Date: 04/24/2019 CLINICAL DATA:  Chest pain. EXAM: CHEST - 2 VIEW COMPARISON:   Radiographs of October 19, 2018. FINDINGS: Stable cardiomediastinal silhouette. No pneumothorax or pleural effusion is noted. No acute pulmonary disease is noted. Bony thorax is unremarkable. IMPRESSION: No active cardiopulmonary disease. Electronically Signed   By: Marijo Conception M.D.   On: 04/24/2019 13:04    Medications: I have reviewed the patient's current medications.  Assessment: Symptomatic anemia, hemoglobin stable at 7.9/8.1, has not required blood transfusions Continued on PPI twice daily Last dose of Xarelto was on 04/24/2019, received aspirin 325 mg yesterday  Plan: Patient to remain n.p.o. post midnight for diagnostic EGD in a.m. If unrevealing, will need colonoscopy to follow thereafter. The risks and the benefits of the procedure were discussed with the patient in details. He understands and verbalizes consent.   Ronnette Juniper 04/26/2019, 11:25 AM   Pager [phone] If no answer or after 5 PM call [phone]

## 2019-04-26 NOTE — Progress Notes (Signed)
PROGRESS NOTE  [name] [name] [id number] DOB: 01/16/1956 DOA: 04/24/2019 PCP: [name] Ruddy, MD  Brief History   [name] [name] is an 63 y.o. male with medical history significant ofnonischemic cardiomyopathy, CAD, chronic diastolic CHF, atrial flutter on Xarelto, hypertension, OSA who presents with chest pain. Patient reports that around 4 AM early this morning he was taking out the garbage and felt a sudden acute onset of stabbing/pressure mid sternal chest pain with associated shortness of breath.  Consultants  . Cardiology . Gastroenterology  Procedures  . None  Antibiotics   Anti-infectives (From admission, onward)   None    .  Subjective  The patient is resting comfortably. No new complaints.  Objective   Vitals:  Vitals:   04/26/19 0917 04/26/19 1214  BP: 121/71 118/62  Pulse: 71 64  Resp: 18 19  Temp: 98.6 F (37 C) 98.7 F (37.1 C)  SpO2: 100% 100%   Exam:  Constitutional:  . The patient is awake, alert, and oriented x 3. No acute distress. [name]  Respiratory:  . No increased work of breathing. . No wheezes, rales, or rhonchi . No tactile fremitus Cardiovascular:  . Regular rate and rhythm . No murmurs, ectopy, or gallups. . No lateral PMI. No thrills. Abdomen:  . Abdomen is soft, non-tender, non-distended . No hernias, masses, or organomegaly . Normoactive bowel sounds.  Musculoskeletal:  . No cyanosis, clubbing, or edema Skin:  . No rashes, lesions, ulcers . palpation of skin: no induration or nodules Neurologic:  . CN 2-12 intact . Sensation all 4 extremities intact Psychiatric:  . Mental status o Mood, affect appropriate o Orientation to person, place, time  . judgment and insight appear intact  I have personally reviewed the following:   Today's Data  . Vitals, CBC, BMP  Scheduled Meds: . amLODipine  5 mg Oral Daily  . atorvastatin  80 mg Oral Daily  . carvedilol  25 mg Oral BID WC  . feeding supplement  (ENSURE ENLIVE)  237 mL Oral BID BM  . furosemide  40 mg Oral Daily   And  . furosemide  20 mg Oral Daily  . hydrALAZINE  75 mg Oral BID  . insulin aspart  0-9 Units Subcutaneous TID WC  . insulin glargine  20 Units Subcutaneous Daily  . isosorbide mononitrate  30 mg Oral Daily  . multivitamin with minerals  1 tablet Oral Daily  . pantoprazole (PROTONIX) IV  40 mg Intravenous Q12H  . sotalol  120 mg Oral Q12H   Continuous Infusions: . sodium chloride Stopped (04/25/19 1312)    Active Problems:   Obstructive sleep apnea   Chest pain   History of atrial fibrillation   History of type 2 diabetes mellitus   Anemia   LOS: 1 day   A & P  Chest pain:  Known coronary artery disease with hx of catheterization in 2018 with proximal right and distal RCA stents, 90% stenosis in LAD.Possibly exacerbated by marked hemoglobin reduction. Cardiology consulted. Recommends noninvasive management. I greatly appreciate cardiology's assistance. Echocardiogram was performed on 04/25/2019. It has demonstrated LVEF of 60-65%, moderately dilated left atrium, mild to moderate mitral valve regurgitation. Tricuspid aortic valve. Continue atorvastatin, coreg and sotalol. Cardio recommends holding aspirin and Xarelto given reduced hemoglobin.  Normocytic anemia: Reported Hemoccult here was negative but no documentation under results, and pt reports melanotic stools. Iron studies demonstrated iron deficiency. The patient has received feraheme infusion. LDH normal, haptoglobin pending. GI has been consulted, they plan  on doing an EGD and possibly a colonoscopy tomorrow. Continue IV protonix.   Paroxysmal atrial fibrillation: Holding Xarelto given hemoglobin reduction.  Type 2 diabetes: He normally takes 38 units of Lantus every morning. The patient is receiving 20 units of Lantus with sensitive sliding scale for correction. He will be NPO after midnight. Hypoglycemia protocol.   Hypertension: Continue  amlodipine, hydralazine, Imdur.  Obstructive sleep apnea: CPAP (patient refused last PM).  Alcohol [id number] for withdrawals  I have seen and examined this patient myself. I have spent 32 minutes in his evaluation and care.  Family Communication/Anticipated D/C date and plan/Code Status   DVT prophylaxis: scd Code Status: Full Code.  Family Communication: spoke with both daughters on the phone Disposition Plan: GI Consult as patient with marked drop in Hgb over a month-- he is high risk due to cardiac history if Hgb continues to drop   Desaray Marschner, DO [facility] Direct contact: see [email or web address]  7PM-7AM contact night coverage as above 04/26/2019, 2:27 PM  LOS: 1 day

## 2019-04-26 NOTE — Progress Notes (Signed)
Patient refused CPAP for tonight. RT informed patient to have RT called if he changes his mind. RT will monitor as needed. 

## 2019-04-26 NOTE — Progress Notes (Signed)
Subjective: The patient was seen and examined at bedside. Reports having a brown-colored stool today morning, able to tolerate regular diet, denies nausea, vomiting or abdominal pain.  Objective: Vital signs in last 24 hours: Temp:  [98.2 F (36.8 C)-98.6 F (37 C)] 98.6 F (37 C) (09/13 0917) Pulse Rate:  [67-74] 71 (09/13 0917) Resp:  [13-18] 18 (09/13 0917) BP: (116-133)/(63-78) 121/71 (09/13 0917) SpO2:  [94 %-100 %] 100 % (09/13 0917) Weight:  [87.7 kg] 87.7 kg (09/13 0419) Weight change: -1.678 kg Last BM Date: 04/25/19  PE: Appears comfortable GENERAL: Mild pallor ABDOMEN: Nondistended EXTREMITIES: No deformity  Lab Results: Results for orders placed or performed during the hospital encounter of 04/24/19 (from the past 48 hour(s))  Basic metabolic panel     Status: Abnormal   Collection Time: 04/24/19 12:26 PM  Result Value Ref Range   Sodium 138 135 - 145 mmol/L   Potassium 3.5 3.5 - 5.1 mmol/L   Chloride 108 98 - 111 mmol/L   CO2 24 22 - 32 mmol/L   Glucose, Bld 124 (H) 70 - 99 mg/dL   BUN 6 (L) 8 - 23 mg/dL   Creatinine, Ser 0.98 0.61 - 1.24 mg/dL   Calcium 8.7 (L) 8.9 - 10.3 mg/dL   GFR calc non Af Amer >60 >60 mL/min   GFR calc Af Amer >60 >60 mL/min   Anion gap 6 5 - 15    Comment: Performed at [facility], [place] [address] [place], [place] [address]  CBC     Status: Abnormal   Collection Time: 04/24/19 12:26 PM  Result Value Ref Range   WBC 6.8 4.0 - 10.5 K/uL   RBC 3.34 (L) 4.22 - 5.81 MIL/uL   Hemoglobin 8.4 (L) 13.0 - 17.0 g/dL   HCT 28.1 (L) 39.0 - 52.0 %   MCV 84.1 80.0 - 100.0 fL   MCH 25.1 (L) 26.0 - 34.0 pg   MCHC 29.9 (L) 30.0 - 36.0 g/dL   RDW 15.5 11.5 - 15.5 %   Platelets 229 150 - 400 K/uL   nRBC 0.0 0.0 - 0.2 %    Comment: Performed at [facility], [place] [address] [place], [place] [address]  Troponin I (High Sensitivity)     Status: Abnormal   Collection Time: 04/24/19 12:26 PM  Result Value Ref Range   Troponin I (High  Sensitivity) 24 (H) <18 ng/L    Comment: (NOTE) Elevated high sensitivity troponin I (hsTnI) values and significant  changes across serial measurements may suggest ACS but many other  chronic and acute conditions are known to elevate hsTnI results.  Refer to the "Links" section for chest pain algorithms and additional  guidance. Performed at [facility], [place] [address], [place], [place] [address]   Troponin I (High Sensitivity)     Status: Abnormal   Collection Time: 04/24/19  5:00 PM  Result Value Ref Range   Troponin I (High Sensitivity) 22 (H) <18 ng/L    Comment: (NOTE) Elevated high sensitivity troponin I (hsTnI) values and significant  changes across serial measurements may suggest ACS but many other  chronic and acute conditions are known to elevate hsTnI results.  Refer to the "Links" section for chest pain algorithms and additional  guidance. Performed at [facility], [place] [address] [place], [place] [address]   Protime-INR     Status: Abnormal   Collection Time: 04/24/19  5:09 PM  Result Value Ref Range  Prothrombin Time 15.4 (H) 11.4 - 15.2 seconds   INR 1.2 0.8 - 1.2    Comment: (NOTE) INR goal varies based on device and disease states. Performed at [facility], [place] [address] [place], [place] [address]   Type and screen [place]     Status: None   Collection Time: 04/24/19  8:41 PM  Result Value Ref Range   ABO/RH(D) A NEG    Antibody Screen NEG    Sample Expiration      04/27/2019,2359 Performed at [facility], [place] [address] [place], [place] [address]   ABO/Rh     Status: None   Collection Time: 04/24/19  8:41 PM  Result Value Ref Range   ABO/RH(D)      A NEG Performed at [facility] [address], [place], [place] [address]   SARS CORONAVIRUS 2 (TAT 6-24 HRS) Nasopharyngeal Nasopharyngeal Swab     Status: None   Collection Time: 04/24/19  8:45 PM   Specimen: Nasopharyngeal Swab  Result  Value Ref Range   SARS Coronavirus 2 NEGATIVE NEGATIVE    Comment: (NOTE) SARS-CoV-2 target nucleic acids are NOT DETECTED. The SARS-CoV-2 RNA is generally detectable in upper and lower respiratory specimens during the acute phase of infection. Negative results do not preclude SARS-CoV-2 infection, do not rule out co-infections with other pathogens, and should not be used as the sole basis for treatment or other patient management decisions. Negative results must be combined with clinical observations, patient history, and epidemiological information. The expected result is Negative. Fact Sheet for Patients: SugarRoll.be Fact Sheet for Healthcare Providers: https://www.woods-mathews.com/ This test is not yet approved or cleared by the [place] FDA and  has been authorized for detection and/or diagnosis of SARS-CoV-2 by FDA under an Emergency Use Authorization (EUA). This EUA will remain  in effect (meaning this test can be used) for the duration of the COVID-19 declaration under Section 56 4(b)(1) of the Act, 21 U.S.C. section 360bbb-3(b)(1), unless the authorization is terminated or revoked sooner. Performed at [facility], [place] [address] [place], [place] [address]   HIV antibody (Routine Testing)     Status: None   Collection Time: 04/24/19  8:48 PM  Result Value Ref Range   HIV Screen 4th Generation wRfx Non Reactive Non Reactive    Comment: (NOTE) Performed At: [facility] [place], [place] [id number] Rush Farmer MD [id number]   Reticulocytes     Status: Abnormal   Collection Time: 04/24/19 11:16 PM  Result Value Ref Range   Retic Ct Pct 2.7 0.4 - 3.1 %    Comment: RESULTS CONFIRMED BY MANUAL DILUTION   RBC. 3.35 (L) 4.22 - 5.81 MIL/uL   Retic Count, Absolute 88.5 19.0 - 186.0 K/uL   Immature Retic Fract 30.7 (H) 2.3 - 15.9 %    Comment: Performed at [facility] [address], [place], [place] [address]  Iron and TIBC     Status: Abnormal   Collection Time: 04/24/19 11:16 PM  Result Value Ref Range   Iron 23 (L) 45 - 182 ug/dL   TIBC 347 250 - 450 ug/dL   Saturation Ratios 7 (L) 17.9 - 39.5 %   UIBC 324 ug/dL    Comment: Performed at [facility], [place] [address] [place], [place] [address]  Ferritin     Status: Abnormal   Collection Time: 04/24/19 11:16 PM  Result Value  Ref Range   Ferritin 14 (L) 24 - 336 ng/mL    Comment: Performed at [facility], [place] [address] [place], [place] [address]  Lactate dehydrogenase     Status: None   Collection Time: 04/24/19 11:16 PM  Result Value Ref Range   LDH 124 98 - 192 U/L    Comment: Performed at [facility], [place] [address] [place] [address] [place], [place] [address]  Glucose, capillary     Status: Abnormal   Collection Time: 04/24/19 11:17 PM  Result Value Ref Range   Glucose-Capillary 131 (H) 70 - 99 mg/dL   Comment 1 Notify RN    Comment 2 Document in Chart   Glucose, capillary     Status: Abnormal   Collection Time: 04/25/19  6:19 AM  Result Value Ref Range   Glucose-Capillary 163 (H) 70 - 99 mg/dL   Comment 1 Notify RN    Comment 2 Document in Chart   CBC     Status: Abnormal   Collection Time: 04/25/19  6:26 AM  Result Value Ref Range   WBC 6.6 4.0 - 10.5 K/uL   RBC 3.14 (L) 4.22 - 5.81 MIL/uL   Hemoglobin 7.9 (L) 13.0 - 17.0 g/dL   HCT 25.5 (L) 39.0 - 52.0 %   MCV 81.2 80.0 - 100.0 fL   MCH 25.2 (L) 26.0 - 34.0 pg   MCHC 31.0 30.0 - 36.0 g/dL   RDW 15.1 11.5 - 15.5 %   Platelets 214 150 - 400 K/uL   nRBC 0.0 0.0 - 0.2 %    Comment: Performed at [facility], [place] [address], [place], [place] [address]  Glucose, capillary     Status: Abnormal   Collection Time: 04/25/19 11:25 AM  Result Value Ref Range   Glucose-Capillary 279 (H) 70 - 99 mg/dL  Glucose, capillary     Status: Abnormal   Collection Time: 04/25/19  1:12 PM  Result Value Ref Range   Glucose-Capillary 197 (H) 70 - 99  mg/dL  Glucose, capillary     Status: Abnormal   Collection Time: 04/25/19  4:15 PM  Result Value Ref Range   Glucose-Capillary 121 (H) 70 - 99 mg/dL  Hemoglobin and hematocrit, blood     Status: Abnormal   Collection Time: 04/25/19  4:28 PM  Result Value Ref Range   Hemoglobin 7.9 (L) 13.0 - 17.0 g/dL   HCT 25.3 (L) 39.0 - 52.0 %    Comment: Performed at [facility], [place] [address] [place], [place] [address]  Glucose, capillary     Status: Abnormal   Collection Time: 04/25/19  9:09 PM  Result Value Ref Range   Glucose-Capillary 213 (H) 70 - 99 mg/dL   Comment 1 Notify RN    Comment 2 Document in Chart   CBC     Status: Abnormal   Collection Time: 04/26/19  5:22 AM  Result Value Ref Range   WBC 6.6 4.0 - 10.5 K/uL   RBC 3.21 (L) 4.22 - 5.81 MIL/uL   Hemoglobin 8.1 (L) 13.0 - 17.0 g/dL   HCT 26.0 (L) 39.0 - 52.0 %   MCV 81.0 80.0 - 100.0 fL   MCH 25.2 (L) 26.0 - 34.0 pg   MCHC 31.2 30.0 - 36.0 g/dL   RDW 15.1 11.5 - 15.5 %   Platelets 232 150 - 400 K/uL   nRBC 0.0 0.0 - 0.2 %    Comment: Performed at [facility],  [address] [place], [place] [id number]  Basic metabolic panel     Status: Abnormal   Collection Time: 04/26/19  5:22 AM  Result Value Ref Range   Sodium 138 135 - 145 mmol/L   Potassium 3.7 3.5 - 5.1 mmol/L   Chloride 107 98 - 111 mmol/L   CO2 23 22 - 32 mmol/L   Glucose, Bld 167 (H) 70 - 99 mg/dL   BUN 11 8 - 23 mg/dL   Creatinine, Ser 1.03 0.61 - 1.24 mg/dL   Calcium 8.7 (L) 8.9 - 10.3 mg/dL   GFR calc non Af Amer >60 >60 mL/min   GFR calc Af Amer >60 >60 mL/min   Anion gap 8 5 - 15    Comment: Performed at [facility] [address] [place], [place] [address]  Glucose, capillary     Status: Abnormal   Collection Time: 04/26/19  6:20 AM  Result Value Ref Range   Glucose-Capillary 139 (H) 70 - 99 mg/dL    Studies/Results: Dg Chest 2 View  Result Date: 04/24/2019 CLINICAL DATA:  Chest pain. EXAM: CHEST - 2 VIEW COMPARISON:   Radiographs of October 19, 2018. FINDINGS: Stable cardiomediastinal silhouette. No pneumothorax or pleural effusion is noted. No acute pulmonary disease is noted. Bony thorax is unremarkable. IMPRESSION: No active cardiopulmonary disease. Electronically Signed   By: Marijo Conception M.D.   On: 04/24/2019 13:04    Medications: I have reviewed the patient's current medications.  Assessment: Symptomatic anemia, hemoglobin stable at 7.9/8.1, has not required blood transfusions Continued on PPI twice daily Last dose of Xarelto was on 04/24/2019, received aspirin 325 mg yesterday  Plan: Patient to remain n.p.o. post midnight for diagnostic EGD in a.m. If unrevealing, will need colonoscopy to follow thereafter. The risks and the benefits of the procedure were discussed with the patient in details. He understands and verbalizes consent.   Ronnette Juniper 04/26/2019, 11:25 AM   Pager [phone] If no answer or after 5 PM call [phone]

## 2019-04-27 ENCOUNTER — Encounter (HOSPITAL_COMMUNITY)
Admission: EM | Disposition: A | Discharge status: Home / Self Care | Payer: Self-pay | Source: Home / Self Care | Attending: Internal Medicine

## 2019-04-27 ENCOUNTER — Inpatient Hospital Stay (HOSPITAL_COMMUNITY): Payer: Medicare Other | Admitting: Anesthesiology

## 2019-04-27 ENCOUNTER — Encounter (HOSPITAL_COMMUNITY): Payer: Self-pay | Admitting: Emergency Medicine

## 2019-04-27 HISTORY — PX: ESOPHAGOGASTRODUODENOSCOPY (EGD) WITH PROPOFOL: SHX5813

## 2019-04-27 LAB — CBC
HCT: 27.4 % — ABNORMAL LOW (ref 39.0–52.0)
Hemoglobin: 8.2 g/dL — ABNORMAL LOW (ref 13.0–17.0)
MCH: 24.8 pg — ABNORMAL LOW (ref 26.0–34.0)
MCHC: 29.9 g/dL — ABNORMAL LOW (ref 30.0–36.0)
MCV: 83 fL (ref 80.0–100.0)
Platelets: 252 10*3/uL (ref 150–400)
RBC: 3.3 MIL/uL — ABNORMAL LOW (ref 4.22–5.81)
RDW: 15.5 % (ref 11.5–15.5)
WBC: 9.3 10*3/uL (ref 4.0–10.5)
nRBC: 0.6 % — ABNORMAL HIGH (ref 0.0–0.2)

## 2019-04-27 LAB — GLUCOSE, CAPILLARY
Glucose-Capillary: 167 mg/dL — ABNORMAL HIGH (ref 70–99)
Glucose-Capillary: 143 mg/dL — ABNORMAL HIGH (ref 70–99)
Glucose-Capillary: 176 mg/dL — ABNORMAL HIGH (ref 70–99)
Glucose-Capillary: 105 mg/dL — ABNORMAL HIGH (ref 70–99)
Glucose-Capillary: 111 mg/dL — ABNORMAL HIGH (ref 70–99)

## 2019-04-27 LAB — BASIC METABOLIC PANEL
Anion gap: 7 (ref 5–15)
BUN: 8 mg/dL (ref 8–23)
CO2: 26 mmol/L (ref 22–32)
Calcium: 9.1 mg/dL (ref 8.9–10.3)
Chloride: 105 mmol/L (ref 98–111)
Creatinine, Ser: 0.94 mg/dL (ref 0.61–1.24)
GFR calc Af Amer: >60 mL/min (ref >60)
GFR calc non Af Amer: >60 mL/min (ref >60)
Glucose, Bld: 99 mg/dL (ref 70–99)
Potassium: 3.6 mmol/L (ref 3.5–5.1)
Sodium: 138 mmol/L (ref 135–145)

## 2019-04-27 SURGERY — ESOPHAGOGASTRODUODENOSCOPY (EGD) WITH PROPOFOL
Anesthesia: Monitor Anesthesia Care

## 2019-04-27 MED ORDER — PANTOPRAZOLE SODIUM 40 MG IV SOLR
40.0000 mg | INTRAVENOUS | Status: DC
  Administered 2019-04-28: 40 mg via INTRAVENOUS
  Filled 2019-04-27: qty 40

## 2019-04-27 MED ORDER — LACTATED RINGERS IV SOLN
INTRAVENOUS | Status: DC
  Administered 2019-04-27: 08:00:00 via INTRAVENOUS

## 2019-04-27 MED ORDER — PEG 3350-KCL-NA BICARB-NACL 420 G PO SOLR: 4000.0000 mL | Freq: Once | ORAL | Status: DC

## 2019-04-27 MED ORDER — LIDOCAINE HCL (CARDIAC) PF 100 MG/5ML IV SOSY
PREFILLED_SYRINGE | INTRAVENOUS | Status: DC | PRN
  Administered 2019-04-27: 100 mg via INTRAVENOUS

## 2019-04-27 MED ORDER — PROPOFOL 10 MG/ML IV BOLUS
INTRAVENOUS | Status: DC | PRN
  Administered 2019-04-27 (×2): 30 mg via INTRAVENOUS

## 2019-04-27 MED ORDER — PEG 3350-KCL-NA BICARB-NACL 420 G PO SOLR
2000.0000 mL | Freq: Once | ORAL | Status: AC
  Administered 2019-04-28: 2000 mL via ORAL

## 2019-04-27 MED ORDER — GLYCOPYRROLATE 0.2 MG/ML IJ SOLN
INTRAMUSCULAR | Status: DC | PRN
  Administered 2019-04-27: 0.1 mg via INTRAVENOUS

## 2019-04-27 MED ORDER — PEG 3350-KCL-NA BICARB-NACL 420 G PO SOLR
2000.0000 mL | Freq: Once | ORAL | Status: AC
  Administered 2019-04-27: 3000 mL via ORAL
  Filled 2019-04-27: qty 4000

## 2019-04-27 MED ORDER — PROPOFOL 500 MG/50ML IV EMUL
INTRAVENOUS | Status: DC | PRN
  Administered 2019-04-27: 100 ug/kg/min via INTRAVENOUS

## 2019-04-27 MED ORDER — INSULIN GLARGINE 100 UNIT/ML ~~LOC~~ SOLN
10.0000 [IU] | Freq: Every day | SUBCUTANEOUS | Status: DC
  Filled 2019-04-27: qty 0.1

## 2019-04-27 MED ORDER — SODIUM CHLORIDE 0.9 % IV SOLN
INTRAVENOUS | Status: DC
  Administered 2019-04-27: 12:00:00 via INTRAVENOUS

## 2019-04-27 SURGICAL SUPPLY — 15 items

## 2019-04-27 NOTE — Progress Notes (Signed)
VAST consulted to place PIV. Spoke with pt who stated IV was leaking earlier. Removed dressing and assessed IV site; flushed with 38mLs normal saline with no leaking noted. Redressed site after cleaning with chlorhexadine. Pt tol well.

## 2019-04-27 NOTE — Plan of Care (Signed)

## 2019-04-27 NOTE — Op Note (Signed)
[facility] Patient Name: [name] [name] Procedure Date : 04/27/2019 MRN: [id number] Attending MD: Ronnette Juniper , MD Date of Birth: 1955-08-24 CSN: [id number] Age: 63 Admit Type: Inpatient Procedure:                Upper GI endoscopy Indications:              Iron deficiency anemia, black stools Providers:                Ronnette Juniper, MD, Carlyn Reichert, RN, Marguerita Merles,                            Technician Referring MD:              Medicines:                Monitored Anesthesia Care Complications:            No immediate complications. Estimated blood loss:                            None. Estimated Blood Loss:     Estimated blood loss: none. Procedure:                Pre-Anesthesia Assessment:                           - Prior to the procedure, a History and Physical                            was performed, and patient medications and                            allergies were reviewed. The patient's tolerance of                            previous anesthesia was also reviewed. The risks                            and benefits of the procedure and the sedation                            options and risks were discussed with the patient.                            All questions were answered, and informed consent                            was obtained. Prior Anticoagulants: The patient has                            taken Xarelto (rivaroxaban), last dose was 3 days                            prior to procedure. ASA Grade Assessment: III - A  patient with severe systemic disease. After                            reviewing the risks and benefits, the patient was                            deemed in satisfactory condition to undergo the                            procedure.                           After obtaining informed consent, the endoscope was                            passed under direct vision. Throughout the     procedure, the patient's blood pressure, pulse, and                            oxygen saturations were monitored continuously. The                            [id number]) Olympus gastroscope was                            introduced through the mouth, and advanced to the                            jejunum. The upper GI endoscopy was accomplished                            without difficulty. The patient tolerated the                            procedure well. Scope In: Scope Out: Findings:      The examined esophagus was normal.      The Z-line was regular and was found 40 cm from the incisors.      A 2 cm hiatal hernia was present.      The cardia, gastric fundus, gastric body, greater curvature of the       stomach, lesser curvature of the stomach, incisura, gastric antrum,       prepyloric region of the stomach and pylorus were normal.      The cardia and gastric fundus were normal on retroflexion.      The examined duodenum was normal.      The examined proximal jejunum was normal. Impression:               - Normal esophagus.                           - Z-line regular, 40 cm from the incisors.                           - 2 cm hiatal hernia.                           -  Normal cardia, gastric fundus, gastric body,                            greater curvature of the stomach, lesser curvature                            of the stomach, incisura, antrum, prepyloric region                            of the stomach and pylorus.                           - Normal examined duodenum.                           - Normal examined jejunum.                           - No specimens collected. Moderate Sedation:      Patient did not receive moderate sedation for this procedure, but       instead received monitored anesthesia care. Recommendation:           - Clear liquid diet.                           - Perform a colonoscopy tomorrow. Procedure Code(s):        --- Professional ---                            [phone], Esophagogastroduodenoscopy, flexible,                            transoral; diagnostic, including collection of                            specimen(s) by brushing or washing, when performed                            (separate procedure) Diagnosis Code(s):        --- Professional ---                           K44.9, Diaphragmatic hernia without obstruction or                            gangrene                           D50.9, Iron deficiency anemia, unspecified CPT copyright 2019 American Medical Association. All rights reserved. The codes documented in this report are preliminary and upon coder review may  be revised to meet current compliance requirements. Ronnette Juniper, MD 04/27/2019 9:21:43 AM This report has been signed electronically. Number of Addenda: 0

## 2019-04-27 NOTE — Progress Notes (Signed)
Patient has been refusing CPAP. 

## 2019-04-27 NOTE — Brief Op Note (Signed)
04/24/2019 - 04/27/2019  9:15 AM  PATIENT:  [name] [name]  63 y.o. male  PRE-OPERATIVE DIAGNOSIS:  anemia,melena  POST-OPERATIVE DIAGNOSIS:  EGD: small hiatal hernia, no bleeding  PROCEDURE:  Procedure(s): ESOPHAGOGASTRODUODENOSCOPY (EGD) WITH PROPOFOL (N/A)  SURGEON:  Surgeon(s) and Role:    Ronnette Juniper, MD - Primary  PHYSICIAN ASSISTANT:   ASSISTANTS: Arvil Chaco, RN, Marguerita Merles, Tech  ANESTHESIA:   MAC  EBL:  None  BLOOD ADMINISTERED:none  DRAINS: none   LOCAL MEDICATIONS USED:  NONE  SPECIMEN:  No Specimen  DISPOSITION OF SPECIMEN:  N/A  COUNTS:  YES  TOURNIQUET:  * No tourniquets in log *  DICTATION: .Dragon Dictation  PLAN OF CARE: Admit to inpatient   PATIENT DISPOSITION:  PACU - hemodynamically stable.   Delay start of Pharmacological VTE agent (>24hrs) due to surgical blood loss or risk of bleeding: no

## 2019-04-27 NOTE — Anesthesia Postprocedure Evaluation (Signed)
Anesthesia Post Note  Patient: [name] [name]  Procedure(s) Performed: ESOPHAGOGASTRODUODENOSCOPY (EGD) WITH PROPOFOL (N/A )     Patient location during evaluation: PACU Anesthesia Type: MAC Level of consciousness: awake and alert Pain management: pain level controlled Vital Signs Assessment: post-procedure vital signs reviewed and stable Respiratory status: spontaneous breathing, nonlabored ventilation and respiratory function stable Cardiovascular status: stable and blood pressure returned to baseline Anesthetic complications: no    Last Vitals:  Vitals:   04/27/19 0921 04/27/19 0935  BP: (!) 100/37 (!) 111/52  Pulse: 63 64  Resp: (!) 22 (!) 22  Temp: 36.5 C   SpO2: 97% 98%    Last Pain:  Vitals:   04/27/19 0935  TempSrc:   PainSc: 0-No pain                 Audry Pili

## 2019-04-27 NOTE — Transfer of Care (Signed)
Immediate Anesthesia Transfer of Care Note  Patient: Alexandra Posadas  Procedure(s) Performed: ESOPHAGOGASTRODUODENOSCOPY (EGD) WITH PROPOFOL (N/A )  Patient Location: PACU and Endoscopy Unit  Anesthesia Type:MAC  Level of Consciousness: drowsy  Airway & Oxygen Therapy: Patient Spontanous Breathing and Patient connected to nasal cannula oxygen  Post-op Assessment: Report given to RN, Post -op Vital signs reviewed and stable and Patient moving all extremities  Post vital signs: Reviewed and stable  Last Vitals:  Vitals Value Taken Time  BP 100/37 04/27/19 0921  Temp 36.5 C 04/27/19 0921  Pulse 64 04/27/19 0922  Resp 21 04/27/19 0922  SpO2 97 % 04/27/19 0922  Vitals shown include unvalidated device data.  Last Pain:  Vitals:   04/27/19 0921  TempSrc: Temporal  PainSc: Asleep         Complications: No apparent anesthesia complications

## 2019-04-27 NOTE — Progress Notes (Signed)
PROGRESS NOTE  [name] [name] [id number] DOB: 11-Jan-1956 DOA: 04/24/2019 PCP: [name], MD  Brief History   [name] [name] is an 63 y.o. male with medical history significant ofnonischemic cardiomyopathy, CAD, chronic diastolic CHF, atrial flutter on Xarelto, hypertension, OSA who presents with chest pain. Patient reports that around 4 AM early this morning he was taking out the garbage and felt a sudden acute onset of stabbing/pressure mid sternal chest pain with associated shortness of breath.  EGD was performed on 04/27/2019. It was normal. The patient will have CSPY tomorrow.  Consultants  . Cardiology . Gastroenterology  Procedures  . EGD  Antibiotics   Anti-infectives (From admission, onward)   None     Subjective  The patient is resting comfortably. No new complaints.  Objective   Vitals:  Vitals:   04/27/19 0935 04/27/19 1316  BP: (!) 111/52 126/78  Pulse: 64 70  Resp: (!) 22 18  Temp:  98 F (36.7 C)  SpO2: 98% 100%   Exam:  Constitutional:  . The patient is awake, alert, and oriented x 3. No acute distress. [name]  Respiratory:  . No increased work of breathing. . No wheezes, rales, or rhonchi . No tactile fremitus Cardiovascular:  . Regular rate and rhythm . No murmurs, ectopy, or gallups. . No lateral PMI. No thrills. Abdomen:  . Abdomen is soft, non-tender, non-distended . No hernias, masses, or organomegaly . Normoactive bowel sounds.  Musculoskeletal:  . No cyanosis, clubbing, or edema Skin:  . No rashes, lesions, ulcers . palpation of skin: no induration or nodules Neurologic:  . CN 2-12 intact . Sensation all 4 extremities intact Psychiatric:  . Mental status o Mood, affect appropriate o Orientation to person, place, time  . judgment and insight appear intact  I have personally reviewed the following:   Today's Data  . Vitals, CBC, BMP  Scheduled Meds: . amLODipine  5 mg Oral Daily  . atorvastatin  80  mg Oral Daily  . carvedilol  25 mg Oral BID WC  . feeding supplement (ENSURE ENLIVE)  237 mL Oral BID BM  . furosemide  40 mg Oral Daily   And  . furosemide  20 mg Oral Daily  . hydrALAZINE  75 mg Oral BID  . insulin aspart  0-9 Units Subcutaneous TID WC  . [START ON 04/28/2019] insulin glargine  10 Units Subcutaneous Daily  . isosorbide mononitrate  30 mg Oral Daily  . multivitamin with minerals  1 tablet Oral Daily  . [START ON 04/28/2019] pantoprazole (PROTONIX) IV  40 mg Intravenous Q24H  . [START ON 04/28/2019] polyethylene glycol-electrolytes  2,000 mL Oral Once  . sotalol  120 mg Oral Q12H   Continuous Infusions: . sodium chloride 20 mL/hr at 04/27/19 1204    Active Problems:   Obstructive sleep apnea   Chest pain   History of atrial fibrillation   History of type 2 diabetes mellitus   Anemia   LOS: 2 days   A & P  Chest pain:  Known coronary artery disease with hx of catheterization in 2018 with proximal right and distal RCA stents, 90% stenosis in LAD.Possibly exacerbated by marked hemoglobin reduction. Cardiology consulted. Recommends noninvasive management. I greatly appreciate cardiology's assistance. Echocardiogram was performed on 04/25/2019. It has demonstrated LVEF of 60-65%, moderately dilated left atrium, mild to moderate mitral valve regurgitation. Tricuspid aortic valve. Continue atorvastatin, coreg and sotalol. Cardio recommends holding aspirin and Xarelto given reduced hemoglobin.  Normocytic anemia: Reported Hemoccult here  was negative but no documentation under results, and pt reports melanotic stools. Iron studies demonstrated iron deficiency. The patient has received feraheme infusion. LDH normal, haptoglobin pending. GI has been consulted. The patient underwent an EGD this morning that was normal. The plan is for a colonoscopy tomorrow. Continue IV protonix.   Paroxysmal atrial fibrillation: Holding Xarelto given hemoglobin reduction.  Type 2 diabetes: He  normally takes 38 units of Lantus every morning. The patient is receiving 20 units of Lantus with sensitive sliding scale for correction. This has been cut in half as the patient will just be on clears today after he was NPO after midnight last night and again tonight.  He will be NPO after midnight. Hypoglycemia protocol. Glucoses have been running 143 - 205.   Hypertension: Continue amlodipine, hydralazine, Imdur.  Obstructive sleep apnea: CPAP (patient refused last PM).  Alcohol WI:[id number] for withdrawals  I have seen and examined this patient myself. I have spent 30 minutes in his evaluation and care.  Family Communication/Anticipated D/C date and plan/Code Status   DVT prophylaxis: scd Code Status: Full Code.  Family Communication: spoke with both daughters on the phone Disposition Plan: GI Consult as patient with marked drop in Hgb over a month-- he is high risk due to cardiac history if Hgb continues to drop   [name], DO [facility] Direct contact: see [email or web address]  7PM-7AM contact night coverage as above 04/27/2019, 2:44 PM  LOS: 1 day

## 2019-04-27 NOTE — Progress Notes (Signed)
Discussed with pt about npo after midnight for procedure, verbalized understanding. Rest in bed

## 2019-04-27 NOTE — Interval H&P Note (Signed)
History and Physical Interval Note: 63/male with symptomatic anemia and black stools,last dose of xarelto 3 days ago for an EGD today.   04/27/2019 8:44 AM  Loura Back  has presented today for EGD, with the diagnosis of anemia,melena.  The various methods of treatment have been discussed with the patient and family. After consideration of risks, benefits and other options for treatment, the patient has consented to  Procedure(s): ESOPHAGOGASTRODUODENOSCOPY (EGD) WITH PROPOFOL (N/A) as a surgical intervention.  The patient's history has been reviewed, patient examined, no change in status, stable for surgery.  I have reviewed the patient's chart and labs.  Questions were answered to the patient's satisfaction.     Ronnette Juniper

## 2019-04-28 ENCOUNTER — Inpatient Hospital Stay (HOSPITAL_COMMUNITY): Payer: Medicare Other | Admitting: Certified Registered"

## 2019-04-28 ENCOUNTER — Encounter (HOSPITAL_COMMUNITY): Payer: Self-pay | Admitting: *Deleted

## 2019-04-28 ENCOUNTER — Encounter (HOSPITAL_COMMUNITY)
Admission: EM | Disposition: A | Discharge status: Home / Self Care | Payer: Self-pay | Source: Home / Self Care | Attending: Internal Medicine

## 2019-04-28 HISTORY — PX: COLONOSCOPY WITH PROPOFOL: SHX5780

## 2019-04-28 HISTORY — PX: POLYPECTOMY: SHX5525

## 2019-04-28 LAB — GLUCOSE, CAPILLARY
Glucose-Capillary: 123 mg/dL — ABNORMAL HIGH (ref 70–99)
Glucose-Capillary: 92 mg/dL (ref 70–99)

## 2019-04-28 LAB — CBC WITH DIFFERENTIAL/PLATELET
Abs Immature Granulocytes: 0.08 10*3/uL — ABNORMAL HIGH (ref 0.00–0.07)
Basophils Absolute: 0 10*3/uL (ref 0.0–0.1)
Basophils Relative: 1 %
Eosinophils Absolute: 0.4 10*3/uL (ref 0.0–0.5)
Eosinophils Relative: 5 %
HCT: 30 % — ABNORMAL LOW (ref 39.0–52.0)
Hemoglobin: 9.4 g/dL — ABNORMAL LOW (ref 13.0–17.0)
Immature Granulocytes: 1 %
Lymphocytes Relative: 23 %
Lymphs Abs: 1.7 10*3/uL (ref 0.7–4.0)
MCH: 25.8 pg — ABNORMAL LOW (ref 26.0–34.0)
MCHC: 31.3 g/dL (ref 30.0–36.0)
MCV: 82.2 fL (ref 80.0–100.0)
Monocytes Absolute: 0.7 10*3/uL (ref 0.1–1.0)
Monocytes Relative: 9 %
Neutro Abs: 4.5 10*3/uL (ref 1.7–7.7)
Neutrophils Relative %: 61 %
Platelets: 266 10*3/uL (ref 150–400)
RBC: 3.65 MIL/uL — ABNORMAL LOW (ref 4.22–5.81)
RDW: 16.2 % — ABNORMAL HIGH (ref 11.5–15.5)
WBC: 7.4 10*3/uL (ref 4.0–10.5)
nRBC: 0.3 % — ABNORMAL HIGH (ref 0.0–0.2)

## 2019-04-28 SURGERY — COLONOSCOPY WITH PROPOFOL
Anesthesia: Monitor Anesthesia Care

## 2019-04-28 MED ORDER — RIVAROXABAN 20 MG PO TABS: 20.0000 mg | ORAL_TABLET | Freq: Every day | ORAL | 0 refills | Status: DC

## 2019-04-28 MED ORDER — LIDOCAINE 2% (20 MG/ML) 5 ML SYRINGE
INTRAMUSCULAR | Status: DC | PRN
  Administered 2019-04-28 (×2): 50 mg via INTRAVENOUS

## 2019-04-28 MED ORDER — PROPOFOL 10 MG/ML IV BOLUS
INTRAVENOUS | Status: DC | PRN
  Administered 2019-04-28: 60 mg via INTRAVENOUS
  Administered 2019-04-28: 50 mg via INTRAVENOUS
  Administered 2019-04-28: 40 mg via INTRAVENOUS

## 2019-04-28 MED ORDER — SODIUM CHLORIDE 0.9 % IV SOLN: INTRAVENOUS | Status: DC | PRN

## 2019-04-28 MED ORDER — ADULT MULTIVITAMIN W/MINERALS CH: 1.0000 | ORAL_TABLET | Freq: Every day | ORAL | 0 refills | Status: DC

## 2019-04-28 MED ORDER — LACTATED RINGERS IV SOLN
INTRAVENOUS | Status: DC
  Administered 2019-04-28: 09:00:00 via INTRAVENOUS

## 2019-04-28 MED ORDER — PANTOPRAZOLE SODIUM 40 MG PO TBEC: 40.0000 mg | DELAYED_RELEASE_TABLET | Freq: Every day | ORAL | 1 refills | Status: DC

## 2019-04-28 MED ORDER — HYDRALAZINE HCL 50 MG PO TABS: 75.0000 mg | ORAL_TABLET | Freq: Two times a day (BID) | ORAL | 0 refills | Status: DC

## 2019-04-28 MED ORDER — LACTATED RINGERS IV SOLN
INTRAVENOUS | Status: DC | PRN
  Administered 2019-04-28: 10:00:00 via INTRAVENOUS

## 2019-04-28 MED ORDER — PROPOFOL 500 MG/50ML IV EMUL
INTRAVENOUS | Status: DC | PRN
  Administered 2019-04-28: 75 ug/kg/min via INTRAVENOUS

## 2019-04-28 SURGICAL SUPPLY — 22 items

## 2019-04-28 NOTE — Progress Notes (Signed)
Pt states he urinated multiple times overnight but was unable to use the urinal due to needing to urinate and defecate simultaneously but would continue to try.

## 2019-04-28 NOTE — Anesthesia Preprocedure Evaluation (Signed)
Anesthesia Evaluation  Patient identified by MRN, date of birth, ID band Patient awake    Reviewed: Allergy & Precautions, NPO status , Patient's Chart, lab work & pertinent test results  Airway Mallampati: II  TM Distance: >3 FB Neck ROM: Full    Dental no notable dental hx.    Pulmonary sleep apnea , former smoker,    Pulmonary exam normal breath sounds clear to auscultation       Cardiovascular hypertension, + CAD  Normal cardiovascular exam+ dysrhythmias Atrial Fibrillation  Rhythm:Regular Rate:Normal  EF normal   Neuro/Psych negative neurological ROS  negative psych ROS   GI/Hepatic negative GI ROS, Neg liver ROS,   Endo/Other  diabetes  Renal/GU negative Renal ROS  negative genitourinary   Musculoskeletal negative musculoskeletal ROS (+)   Abdominal   Peds negative pediatric ROS (+)  Hematology  (+) anemia ,   Anesthesia Other Findings   Reproductive/Obstetrics negative OB ROS                             Anesthesia Physical Anesthesia Plan  ASA: III  Anesthesia Plan: MAC   Post-op Pain Management:    Induction: Intravenous  PONV Risk Score and Plan:   Airway Management Planned: Simple Face Mask  Additional Equipment:   Intra-op Plan:   Post-operative Plan:   Informed Consent: I have reviewed the patients History and Physical, chart, labs and discussed the procedure including the risks, benefits and alternatives for the proposed anesthesia with the patient or authorized representative who has indicated his/her understanding and acceptance.     Dental advisory given  Plan Discussed with: CRNA and Surgeon  Anesthesia Plan Comments:         Anesthesia Quick Evaluation

## 2019-04-28 NOTE — Discharge Summary (Signed)
Physician Discharge Summary  [name] [name] [id number] DOB: March 29, 1956 DOA: 04/24/2019  PCP: [name], MD  Admit date: 04/24/2019 Discharge date: 04/28/2019  Recommendations for Outpatient Follow-up:  1. Follow up with PCP in 7-10 days 2. Follow up with GI as directed. 3. Have CBC drawn in one week and reported to PCP.  Discharge Diagnoses: Principal diagnosis is #1 1. Normocytic anemia 2. GI Bleed - no source of bleeding identified. 3. Paroxysmal atrial fibrillation 4. DM II 5. Hypertension 6. OSA 7. ETOH abuse  Discharge Condition: Fair Disposition: Home  Diet recommendation: Heart healthy/Carbohydrate controlled.  Filed Weights   04/26/19 0419 04/27/19 0519 04/28/19 0457  Weight: 87.7 kg 86.2 kg 87.9 kg    History of present illness: [name] [name] is a 63 y.o. male with medical history significant of nonischemic cardiomyopathy, CAD, chronic diastolic CHF, atrial flutter on Xarelto, hypertension, OSA who presents with chest pain.  Patient reports that around 4 AM early this morning he was taking out the garbage and felt a sudden acute onset of stabbing/pressure mid sternal chest pain with associated shortness of breath.  No nausea or vomiting.  Chest pain subsided after laying in bed for an hour but he was very diaphoretic.  Daughters then prompted him to present to the emergency department.  Patient also reports that last week he had an infection and was given Cipro and did not quite get back to his baseline.  He also noted dark stools a few days ago but no bright red blood per rectum.  States that his stool after that return back to normal.  Denies any other bleeding.  His last dose of Xarelto was yesterday morning.  Reports he had a colonoscopy possibly in 2016 with some polyps found.  Also endorses issues with constipation.    ED Course: He was afebrile, hypertensive with systolics of 889V and comfortable on room air.  CBC showed no leukocytosis but  had anemia of 8.4 (down from 11.5 a month before).  BMP otherwise unremarkable.  Troponin of 22 and 24.  Chest x-ray showed no acute cardiopulmonary process.  EKG showed sinus rhythm with first-degree block.  Hospital Course:  [name] [name] an 63 y.o.malewith medical history significant ofnonischemic cardiomyopathy, CAD, chronic diastolic CHF, atrial flutter on Xarelto, hypertension, OSA who presents with chest pain. Patient reports that around 4 AM early this morning he was taking out the garbage and felt a sudden acute onset of stabbing/pressure mid sternal chest pain with associated shortness of breath.  EGD was performed on 04/27/2019. It was normal. The patient will have CSPY tomorrow.  Today's assessment: S: The patient is resting comfortably following his colonoscopy. No new complaints. O: Vitals:  Vitals:   04/28/19 1057 04/28/19 1336  BP: (!) 115/48 138/84  Pulse: 63 69  Resp: 14 20  Temp:  98.2 F (36.8 C)  SpO2: 100% 98%    Exam:  Constitutional:  . The patient is awake, alert, and oriented x 3. No acute distress. Respiratory:  . No increased work of breathing. . No wheezes, rales, or rhonchi . No tactile fremitus Cardiovascular:  . Regular rate and rhythm . No murmurs, ectopy, or gallups. . No lateral PMI. No thrills. Abdomen:  . Abdomen is soft, non-tender, non-distended . No hernias, masses, or organomegaly . Normoactive bowel sounds.  Musculoskeletal:  . No cyanosis, clubbing, or edema Skin:  . No rashes, lesions, ulcers . palpation of skin: no induration or nodules Neurologic:  . CN 2-12 intact .  Sensation all 4 extremities intact Psychiatric:  . Mental status o Mood, affect appropriate o Orientation to person, place, time  . judgment and insight appear intact   Discharge Instructions  Discharge Instructions    Activity as tolerated - No restrictions   Complete by: As directed    Call MD for:  difficulty breathing, headache or  visual disturbances   Complete by: As directed    Call MD for:  persistant dizziness or light-headedness   Complete by: As directed    Diet - low sodium heart healthy   Complete by: As directed    Carbohydrate controlled.   Discharge instructions   Complete by: As directed    Follow up with PCP in 7-10 days. Follow up with GI as directed. Have CBC checked in one week and reported to PCP.   Increase activity slowly   Complete by: As directed      Allergies as of 04/28/2019      Reactions   Shrimp [shellfish Allergy] Nausea And Vomiting   Ace Inhibitors Cough   Other Hives, Other (See Comments)   Patient reports developing hives after receiving "some antibiotic given in 1980''s at [facility]". He does not know which antibiotic.      Medication List    STOP taking these medications   chlorpheniramine-HYDROcodone 10-8 MG/5ML Suer Commonly known as: Tussionex Pennkinetic ER   cyclobenzaprine 10 MG tablet Commonly known as: FLEXERIL   gabapentin 300 MG capsule Commonly known as: NEURONTIN     TAKE these medications   Accu-Chek Aviva Plus w/Device Kit Use as directed for 3 times daily testing of blood glucose   accu-chek softclix lancets Use as instructed for 3 times daily testing of blood glucose   amLODipine 5 MG tablet Commonly known as: NORVASC Take 1 tablet (5 mg total) by mouth daily.   atorvastatin 80 MG tablet Commonly known as: LIPITOR Take 1 tablet (80 mg total) by mouth daily.   carvedilol 25 MG tablet Commonly known as: COREG Take 1 tablet (25 mg total) by mouth 2 (two) times daily with a meal.   furosemide 20 MG tablet Commonly known as: LASIX TAKE TWO TABLETS BY MOUTH IN THE MORNING, THEN TAKE ONE IN THE EVENING What changed:   how much to take  how to take this  when to take this  additional instructions   glucose blood test strip Commonly known as: Accu-Chek Aviva Plus Use as instructed for 3 times daily testing of blood glucose    hydrALAZINE 50 MG tablet Commonly known as: APRESOLINE Take 1.5 tablets (75 mg total) by mouth 2 (two) times daily.   Insulin Glargine 100 UNIT/ML Solostar Pen Commonly known as: Lantus SoloStar Inject 35 Units into the skin every morning. And pen needles 1/day What changed:   how much to take  when to take this  additional instructions   isosorbide mononitrate 30 MG 24 hr tablet Commonly known as: IMDUR Take 1 tablet (30 mg total) by mouth daily.   metFORMIN 1000 MG tablet Commonly known as: GLUCOPHAGE Take 1 tablet (1,000 mg total) by mouth 2 (two) times daily with a meal.   multivitamin with minerals Tabs tablet Take 1 tablet by mouth daily. Start taking on: April 29, 2019   nitroGLYCERIN 0.4 MG SL tablet Commonly known as: NITROSTAT Place 1 tablet (0.4 mg total) under the tongue every 5 (five) minutes as needed for chest pain (x 3 tabs daily). What changed:   when to take this  reasons to take this   ondansetron 4 MG disintegrating tablet Commonly known as: ZOFRAN-ODT Take 1 tablet (4 mg total) by mouth every 8 (eight) hours as needed for nausea or vomiting.   pantoprazole 40 MG tablet Commonly known as: Protonix Take 1 tablet (40 mg total) by mouth daily.   rivaroxaban 20 MG Tabs tablet Commonly known as: Xarelto Take 1 tablet (20 mg total) by mouth daily. Start taking on: April 29, 2019   sotalol 120 MG tablet Commonly known as: BETAPACE TAKE 1 TABLET BY MOUTH EVERY 12 HOURS   TRUEplus Lancets 28G Misc 1 each by Does not apply route 3 (three) times daily.   Accu-Chek Softclix Lancets lancets Use as instructed for three times daily testing of blood glucose      Allergies  Allergen Reactions  . Shrimp [Shellfish Allergy] Nausea And Vomiting  . Ace Inhibitors Cough  . Other Hives and Other (See Comments)    Patient reports developing hives after receiving "some antibiotic given in 1980''s at [facility]". He does not know which  antibiotic.    The results of significant diagnostics from this hospitalization (including imaging, microbiology, ancillary and laboratory) are listed below for reference.    Significant Diagnostic Studies: Dg Chest 2 View  Result Date: 04/24/2019 CLINICAL DATA:  Chest pain. EXAM: CHEST - 2 VIEW COMPARISON:  Radiographs of October 19, 2018. FINDINGS: Stable cardiomediastinal silhouette. No pneumothorax or pleural effusion is noted. No acute pulmonary disease is noted. Bony thorax is unremarkable. IMPRESSION: No active cardiopulmonary disease. Electronically Signed   By: Marijo Conception M.D.   On: 04/24/2019 13:04    Microbiology: Recent Results (from the past 240 hour(s))  SARS CORONAVIRUS 2 (TAT 6-24 HRS) Nasopharyngeal Nasopharyngeal Swab     Status: None   Collection Time: 04/24/19  8:45 PM   Specimen: Nasopharyngeal Swab  Result Value Ref Range Status   SARS Coronavirus 2 NEGATIVE NEGATIVE Final    Comment: (NOTE) SARS-CoV-2 target nucleic acids are NOT DETECTED. The SARS-CoV-2 RNA is generally detectable in upper and lower respiratory specimens during the acute phase of infection. Negative results do not preclude SARS-CoV-2 infection, do not rule out co-infections with other pathogens, and should not be used as the sole basis for treatment or other patient management decisions. Negative results must be combined with clinical observations, patient history, and epidemiological information. The expected result is Negative. Fact Sheet for Patients: SugarRoll.be Fact Sheet for Healthcare Providers: https://www.woods-mathews.com/ This test is not yet approved or cleared by the [place] FDA and  has been authorized for detection and/or diagnosis of SARS-CoV-2 by FDA under an Emergency Use Authorization (EUA). This EUA will remain  in effect (meaning this test can be used) for the duration of the COVID-19 declaration under Section 56 4(b)(1)  of the Act, 21 U.S.C. section 360bbb-3(b)(1), unless the authorization is terminated or revoked sooner. Performed at [facility], [place] [address], [place], [place] [address]      Labs: Basic Metabolic Panel: Recent Labs  Lab 04/24/19 1226 04/26/19 0522 04/27/19 1646  NA 138 138 138  K 3.5 3.7 3.6  CL 108 107 105  CO2 _0 GLUCOSE 124* 167* 99  BUN 6* 11 8  CREATININE 0.98 1.03 0.94  CALCIUM 8.7* 8.7* 9.1   Liver Function Tests: No results for input(s): AST, ALT, ALKPHOS, BILITOT, PROT, ALBUMIN in the last 168 hours. No results for input(s): LIPASE, AMYLASE in the last 168 hours. No results  for input(s): AMMONIA in the last 168 hours. CBC: Recent Labs  Lab 04/24/19 1226 04/25/19 0626 04/25/19 1628 04/26/19 0522 04/27/19 0709 04/28/19 0648  WBC 6.8 6.6  --  6.6 9.3 7.4  NEUTROABS  --   --   --   --   --  4.5  HGB 8.4* 7.9* 7.9* 8.1* 8.2* 9.4*  HCT 28.1* 25.5* 25.3* 26.0* 27.4* 30.0*  MCV 84.1 81.2  --  81.0 83.0 82.2  PLT 229 214  --  232 252 266   Cardiac Enzymes: No results for input(s): CKTOTAL, CKMB, CKMBINDEX, TROPONINI in the last 168 hours. BNP: BNP (last 3 results) Recent Labs    09/29/18 1217  BNP 86.6    ProBNP (last 3 results) No results for input(s): PROBNP in the last 8760 hours.  CBG: Recent Labs  Lab 04/27/19 1312 04/27/19 1622 04/27/19 2125 04/28/19 0615 04/28/19 1111  GLUCAP 176* 105* 111* 123* 92    Active Problems:   Obstructive sleep apnea   Chest pain   History of atrial fibrillation   History of type 2 diabetes mellitus   Anemia  Time coordinating discharge: 38 minutes.  Signed:        Shivaay Stormont, DO [facility]  04/28/2019, 2:47 PM

## 2019-04-28 NOTE — Interval H&P Note (Signed)
History and Physical Interval Note: 63/male with anemia and unremarkable EGD yesterday for a colonoscopy today.  04/28/2019 10:03 AM  [name] [name]  has presented today forcolonoscopy, with the diagnosis of Anemia, normal EGD.  The various methods of treatment have been discussed with the patient and family. After consideration of risks, benefits and other options for treatment, the patient has consented to  Procedure(s): COLONOSCOPY WITH PROPOFOL (N/A) as a surgical intervention.  The patient's history has been reviewed, patient examined, no change in status, stable for surgery.  I have reviewed the patient's chart and labs.  Questions were answered to the patient's satisfaction.     Ronnette Juniper

## 2019-04-28 NOTE — Anesthesia Procedure Notes (Signed)
Procedure Name: MAC Date/Time: 04/28/2019 10:35 AM Performed by: Orlie Dakin, CRNA Pre-anesthesia Checklist: Patient identified, Emergency Drugs available, Suction available and Patient being monitored Patient Re-evaluated:Patient Re-evaluated prior to induction Oxygen Delivery Method: Simple face mask Preoxygenation: Pre-oxygenation with 100% oxygen Induction Type: IV induction

## 2019-04-28 NOTE — Anesthesia Postprocedure Evaluation (Signed)
Anesthesia Post Note  Patient: [name] [name]  Procedure(s) Performed: COLONOSCOPY WITH PROPOFOL (N/A ) POLYPECTOMY     Patient location during evaluation: PACU Anesthesia Type: MAC Level of consciousness: awake and alert Pain management: pain level controlled Vital Signs Assessment: post-procedure vital signs reviewed and stable Respiratory status: spontaneous breathing, nonlabored ventilation, respiratory function stable and patient connected to nasal cannula oxygen Cardiovascular status: stable and blood pressure returned to baseline Postop Assessment: no apparent nausea or vomiting Anesthetic complications: no    Last Vitals:  Vitals:   04/28/19 1047 04/28/19 1057  BP: (!) 97/38 (!) 115/48  Pulse: 64 63  Resp: (!) 27 14  Temp: 36.7 C   SpO2: 100% 100%    Last Pain:  Vitals:   04/28/19 1057  TempSrc:   PainSc: 0-No pain                 Deneene Tarver S

## 2019-04-28 NOTE — Transfer of Care (Signed)
Immediate Anesthesia Transfer of Care Note  Patient: [name] [name]  Procedure(s) Performed: COLONOSCOPY WITH PROPOFOL (N/A ) POLYPECTOMY  Patient Location: Endoscopy Unit  Anesthesia Type:MAC  Level of Consciousness: drowsy  Airway & Oxygen Therapy: Patient Spontanous Breathing and Patient connected to face mask oxygen  Post-op Assessment: Report given to RN and Post -op Vital signs reviewed and stable  Post vital signs: Reviewed and stable  Last Vitals:  Vitals Value Taken Time  BP    Temp    Pulse 64 04/28/19 1047  Resp 31 04/28/19 1047  SpO2 100 % 04/28/19 1047  Vitals shown include unvalidated device data.  Last Pain:  Vitals:   04/28/19 0915  TempSrc: Temporal  PainSc: 0-No pain         Complications: No apparent anesthesia complications

## 2019-04-28 NOTE — Brief Op Note (Signed)
04/24/2019 - 04/28/2019  10:46 AM  PATIENT:  [name] [name]  63 y.o. male  PRE-OPERATIVE DIAGNOSIS:  Anemia, normal EGD  POST-OPERATIVE DIAGNOSIS:  cecal and descending colon polyp removed with hot snare, hemorrhoids, diverticulosis  PROCEDURE:  Procedure(s): COLONOSCOPY WITH PROPOFOL (N/A) POLYPECTOMY  SURGEON:  Surgeon(s) and Role:    Ronnette Juniper, MD - Primary  PHYSICIAN ASSISTANT:   ASSISTANTS: Vista Lawman, RN,Guillame Awaka ,Tech  ANESTHESIA:   MAC  EBL:  minimal   BLOOD ADMINISTERED:none  DRAINS: none   LOCAL MEDICATIONS USED:  NONE  SPECIMEN:  Biopsy / Limited Resection  DISPOSITION OF SPECIMEN:  PATHOLOGY  COUNTS:  YES  TOURNIQUET:  * No tourniquets in log *  DICTATION: .Dragon Dictation  PLAN OF CARE: Admit to inpatient   PATIENT DISPOSITION:  PACU - hemodynamically stable.   Delay start of Pharmacological VTE agent (>24hrs) due to surgical blood loss or risk of bleeding: no

## 2019-04-28 NOTE — Progress Notes (Signed)
Patient alert and oriented, denies pain, VSS, IV and tele removed, d/c instruction explain and given to patient  And daughter, both verbalized understanding. Patient d/c home per order

## 2019-04-28 NOTE — Care Management Important Message (Signed)
Important Message  Patient Details  Name: Hall Ashbeck MRN: [id number] Date of Birth: Feb 25, 1956   Medicare Important Message Given:  Yes     Shelda Altes 04/28/2019, 11:11 AM

## 2019-04-28 NOTE — CV Procedure (Signed)
Colonoscopy showed 1. Normal terminal ileum 2.  Small few diverticulosis in sigmoid and descending colon. 3.  Cecal polyp and a descending colon polyp-both removed with snare 4.  Small internal hemorrhoids.   No evidence of active or recent bleeding. Recommend: Resume regular diet. Okay to restart Xarelto tomorrow. Follow-up pathology as an outpatient. OK to DC from GI standpoint.  Ronnette Juniper, MD

## 2019-04-28 NOTE — Op Note (Signed)
[facility] Patient Name: [name] [name] Procedure Date : 04/28/2019 MRN: [id number] Attending MD: Ronnette Juniper , MD Date of Birth: [birth year] CSN: [id number] Age: 63 Admit Type: Inpatient Procedure:                Colonoscopy Indications:              Last colonoscopy: 2016, Unexplained iron deficiency                            anemia,unremarkable EGD Providers:                Ronnette Juniper, MD, Vista Lawman, RN, William Dalton,                            Technician Referring MD:              Medicines:                Monitored Anesthesia Care Complications:            No immediate complications. Estimated blood loss:                            Minimal. Estimated Blood Loss:     Estimated blood loss was minimal. Procedure:                Pre-Anesthesia Assessment:                           - Prior to the procedure, a History and Physical                            was performed, and patient medications and                            allergies were reviewed. The patient's tolerance of                            previous anesthesia was also reviewed. The risks                            and benefits of the procedure and the sedation                            options and risks were discussed with the patient.                            All questions were answered, and informed consent                            was obtained. Prior Anticoagulants: The patient has                            taken Xarelto (rivaroxaban), last dose was 4 days                            prior to procedure. ASA Grade  Assessment: III - A                            patient with severe systemic disease. After                            reviewing the risks and benefits, the patient was                            deemed in satisfactory condition to undergo the                            procedure.                           After obtaining informed consent, the colonoscope                             was passed under direct vision. Throughout the                            procedure, the patient's blood pressure, pulse, and                            oxygen saturations were monitored continuously. The                            [id number] Olympus pediatric colonoscope                            was introduced through the anus and advanced to the                            the terminal ileum. The colonoscopy was performed                            without difficulty. The patient tolerated the                            procedure well. The quality of the bowel                            preparation was good. Findings:      The perianal and digital rectal examinations were normal.      The terminal ileum appeared normal.      A 6 mm polyp was found in the cecum. The polyp was sessile. The polyp       was removed with a cold snare. Resection and retrieval were complete.      A 5 mm polyp was found in the descending colon. The polyp was sessile.       The polyp was removed with a hot snare. Resection and retrieval were       complete.      A few small-mouthed diverticula were found in the sigmoid colon and       descending colon.  Non-bleeding internal hemorrhoids were found during retroflexion. The       hemorrhoids were small.      The exam was otherwise without abnormality. Impression:               - The examined portion of the ileum was normal.                           - One 6 mm polyp in the cecum, removed with a cold                            snare. Resected and retrieved.                           - One 5 mm polyp in the descending colon, removed                            with a hot snare. Resected and retrieved.                           - Diverticulosis in the sigmoid colon and in the                            descending colon.                           - Non-bleeding internal hemorrhoids.                           - The examination was otherwise  normal. Moderate Sedation:      Patient did not receive moderate sedation for this procedure, but       instead received monitored anesthesia care. Recommendation:           - Resume regular diet.                           - Continue present medications.                           - Await pathology results.                           - Repeat colonoscopy for surveillance based on                            pathology results.                           - Resume Xarelto (rivaroxaban) at prior dose                            tomorrow. Procedure Code(s):        --- Professional ---                           [phone], Colonoscopy, flexible; with removal of  tumor(s), polyp(s), or other lesion(s) by snare                            technique Diagnosis Code(s):        --- Professional ---                           K64.8, Other hemorrhoids                           K63.5, Polyp of colon                           D50.9, Iron deficiency anemia, unspecified                           K57.30, Diverticulosis of large intestine without                            perforation or abscess without bleeding CPT copyright 2019 American Medical Association. All rights reserved. The codes documented in this report are preliminary and upon coder review may  be revised to meet current compliance requirements. Ronnette Juniper, MD 04/28/2019 10:46:04 AM This report has been signed electronically. Number of Addenda: 0

## 2019-04-29 ENCOUNTER — Encounter (HOSPITAL_COMMUNITY): Payer: Self-pay | Admitting: Gastroenterology

## 2019-04-29 LAB — SURGICAL PATHOLOGY

## 2019-05-02 ENCOUNTER — Other Ambulatory Visit: Payer: Self-pay

## 2019-05-02 DIAGNOSIS — E1169 Type 2 diabetes mellitus with other specified complication: Secondary | ICD-10-CM

## 2019-05-02 DIAGNOSIS — Z794 Long term (current) use of insulin: Secondary | ICD-10-CM

## 2019-05-02 MED ORDER — METFORMIN HCL 1000 MG PO TABS: 1000.0000 mg | ORAL_TABLET | Freq: Two times a day (BID) | ORAL | 1 refills | Status: DC

## 2019-05-27 ENCOUNTER — Encounter: Payer: Self-pay | Admitting: Family Medicine

## 2019-05-27 ENCOUNTER — Ambulatory Visit (INDEPENDENT_AMBULATORY_CARE_PROVIDER_SITE_OTHER): Payer: Medicare Other | Admitting: Family Medicine

## 2019-05-27 ENCOUNTER — Other Ambulatory Visit: Payer: Self-pay

## 2019-05-27 ENCOUNTER — Telehealth (HOSPITAL_COMMUNITY): Payer: Self-pay

## 2019-05-27 VITALS — BP 134/72 | HR 76 | Temp 97.6°F | Wt 201.0 lb

## 2019-05-27 DIAGNOSIS — I10 Essential (primary) hypertension: Secondary | ICD-10-CM | POA: Diagnosis not present

## 2019-05-27 DIAGNOSIS — R079 Chest pain, unspecified: Secondary | ICD-10-CM

## 2019-05-27 DIAGNOSIS — I4891 Unspecified atrial fibrillation: Secondary | ICD-10-CM | POA: Diagnosis not present

## 2019-05-27 DIAGNOSIS — D649 Anemia, unspecified: Secondary | ICD-10-CM

## 2019-05-27 DIAGNOSIS — I5032 Chronic diastolic (congestive) heart failure: Secondary | ICD-10-CM

## 2019-05-27 LAB — CBC
HCT: 34.9 % — ABNORMAL LOW (ref 39.0–52.0)
Hemoglobin: 10.7 g/dL — ABNORMAL LOW (ref 13.0–17.0)
MCHC: 30.7 g/dL (ref 30.0–36.0)
MCV: 75.5 fl — ABNORMAL LOW (ref 78.0–100.0)
Platelets: 238 10*3/uL (ref 150.0–400.0)
RBC: 4.62 Mil/uL (ref 4.22–5.81)
RDW: 18.8 % — ABNORMAL HIGH (ref 11.5–15.5)
WBC: 6.9 10*3/uL (ref 4.0–10.5)

## 2019-05-27 LAB — LIPID PANEL
Cholesterol: 116 mg/dL (ref 0–200)
HDL: 49.5 mg/dL (ref >39.00)
LDL Cholesterol: 44 mg/dL (ref 0–99)
NonHDL: 66.92
Total CHOL/HDL Ratio: 2
Triglycerides: 117 mg/dL (ref 0.0–149.0)
VLDL: 23.4 mg/dL (ref 0.0–40.0)

## 2019-05-27 NOTE — Patient Instructions (Addendum)
Please call and schedule an appointment with your GI doctor and your cardiologist Dr. Loralie Champagne  Anemia  Anemia is a condition in which you do not have enough red blood cells or hemoglobin. Hemoglobin is a substance in red blood cells that carries oxygen. When you do not have enough red blood cells or hemoglobin (are anemic), your body cannot get enough oxygen and your organs may not work properly. As a result, you may feel very tired or have other problems. What are the causes? Common causes of anemia include:  Excessive bleeding. Anemia can be caused by excessive bleeding inside or outside the body, including bleeding from the intestine or from periods in women.  Poor nutrition.  Long-lasting (chronic) kidney, thyroid, and liver disease.  Bone marrow disorders.  Cancer and treatments for cancer.  HIV (human immunodeficiency virus) and AIDS (acquired immunodeficiency syndrome).  Treatments for HIV and AIDS.  Spleen problems.  Blood disorders.  Infections, medicines, and autoimmune disorders that destroy red blood cells. What are the signs or symptoms? Symptoms of this condition include:  Minor weakness.  Dizziness.  Headache.  Feeling heartbeats that are irregular or faster than normal (palpitations).  Shortness of breath, especially with exercise.  Paleness.  Cold sensitivity.  Indigestion.  Nausea.  Difficulty sleeping.  Difficulty concentrating. Symptoms may occur suddenly or develop slowly. If your anemia is mild, you may not have symptoms. How is this diagnosed? This condition is diagnosed based on:  Blood tests.  Your medical history.  A physical exam.  Bone marrow biopsy. Your health care provider may also check your stool (feces) for blood and may do additional testing to look for the cause of your bleeding. You may also have other tests, including:  Imaging tests, such as a CT scan or MRI.  Endoscopy.  Colonoscopy. How is this  treated? Treatment for this condition depends on the cause. If you continue to lose a lot of blood, you may need to be treated at a hospital. Treatment may include:  Taking supplements of iron, vitamin O75, or folic acid.  Taking a hormone medicine (erythropoietin) that can help to stimulate red blood cell growth.  Having a blood transfusion. This may be needed if you lose a lot of blood.  Making changes to your diet.  Having surgery to remove your spleen. Follow these instructions at home:  Take over-the-counter and prescription medicines only as told by your health care provider.  Take supplements only as told by your health care provider.  Follow any diet instructions that you were given.  Keep all follow-up visits as told by your health care provider. This is important. Contact a health care provider if:  You develop new bleeding anywhere in the body. Get help right away if:  You are very weak.  You are short of breath.  You have pain in your abdomen or chest.  You are dizzy or feel faint.  You have trouble concentrating.  You have bloody or black, tarry stools.  You vomit repeatedly or you vomit up blood. Summary  Anemia is a condition in which you do not have enough red blood cells or enough of a substance in your red blood cells that carries oxygen (hemoglobin).  Symptoms may occur suddenly or develop slowly.  If your anemia is mild, you may not have symptoms.  This condition is diagnosed with blood tests as well as a medical history and physical exam. Other tests may be needed.  Treatment for this condition depends on  the cause of the anemia. This information is not intended to replace advice given to you by your health care provider. Make sure you discuss any questions you have with your health care provider. Document Released: 09/06/2004 Document Revised: 07/12/2017 Document Reviewed: 08/31/2016 Elsevier Patient Education  [place].  Nonspecific Chest Pain, Adult Chest pain can be caused by many different conditions. It can be caused by a condition that is life-threatening and requires treatment right away. It can also be caused by something that is not life-threatening. If you have chest pain, it can be hard to know the difference, so it is important to get help right away to make sure that you do not have a serious condition. Some life-threatening causes of chest pain include:  Heart attack.  A tear in the body's main blood vessel (aortic dissection).  Inflammation around your heart (pericarditis).  A problem in the lungs, such as a blood clot (pulmonary embolism) or a collapsed lung (pneumothorax). Some non life-threatening causes of chest pain include:  Heartburn.  Anxiety or stress.  Damage to the bones, muscles, and cartilage that make up your chest wall.  Pneumonia or bronchitis.  Shingles infection (varicella-zoster virus). Chest pain can feel like:  Pain or discomfort on the surface of your chest or deep in your chest.  Crushing, pressure, aching, or squeezing pain.  Burning or tingling.  Dull or sharp pain that is worse when you move, cough, or take a deep breath.  Pain or discomfort that is also felt in your back, neck, jaw, shoulder, or arm, or pain that spreads to any of these areas. Your chest pain may come and go. It may also be constant. Your health care provider will do lab tests and other studies to find the cause of your pain. Treatment will depend on the cause of your chest pain. Follow these instructions at home: Medicines  Take over-the-counter and prescription medicines only as told by your health care provider.  If you were prescribed an antibiotic, take it as told by your health care provider. Do not stop taking the antibiotic even if you start to feel better. Lifestyle   Rest as directed by your health care provider.  Do not use any products that contain nicotine or  tobacco, such as cigarettes and e-cigarettes. If you need help quitting, ask your health care provider.  Do not drink alcohol.  Make healthy lifestyle choices as recommended. These may include: ? Getting regular exercise. Ask your health care provider to suggest some activities that are safe for you. ? Eating a heart-healthy diet. This includes plenty of fresh fruits and vegetables, whole grains, low-fat (lean) protein, and low-fat dairy products. A dietitian can help you find healthy eating options. ? Maintaining a healthy weight. ? Managing any other health conditions you have, such as high blood pressure (hypertension) or diabetes. ? Reducing stress, such as with yoga or relaxation techniques. General instructions  Pay attention to any changes in your symptoms. Tell your health care provider about them or any new symptoms.  Avoid any activities that cause chest pain.  Keep all follow-up visits as told by your health care provider. This is important. This includes visits for any further testing if your chest pain does not go away. Contact a health care provider if:  Your chest pain does not go away.  You feel depressed.  You have a fever. Get help right away if:  Your chest pain gets worse.  You have a cough  that gets worse, or you cough up blood.  You have severe pain in your abdomen.  You faint.  You have sudden, unexplained chest discomfort.  You have sudden, unexplained discomfort in your arms, back, neck, or jaw.  You have shortness of breath at any time.  You suddenly start to sweat, or your skin gets clammy.  You feel nausea or you vomit.  You suddenly feel lightheaded or dizzy.  You have severe weakness, or unexplained weakness or fatigue.  Your heart begins to beat quickly, or it feels like it is skipping beats. These symptoms may represent a serious problem that is an emergency. Do not wait to see if the symptoms will go away. Get medical help right away.  Call your local emergency services (911 in the [place] Do not drive yourself to the hospital. Summary  Chest pain can be caused by a condition that is serious and requires urgent treatment. It may also be caused by something that is not life-threatening.  If you have chest pain, it is very important to see your health care provider. Your health care provider may do lab tests and other studies to find the cause of your pain.  Follow your health care provider's instructions on taking medicines, making lifestyle changes, and getting emergency treatment if symptoms become worse.  Keep all follow-up visits as told by your health care provider. This includes visits for any further testing if your chest pain does not go away. This information is not intended to replace advice given to you by your health care provider. Make sure you discuss any questions you have with your health care provider. Document Released: 05/09/2005 Document Revised: 01/30/2018 Document Reviewed: 01/30/2018 Elsevier Patient Education  2020 [employer].

## 2019-05-27 NOTE — Progress Notes (Signed)
Subjective:    Patient ID: [name] [name], male    DOB: [birth year], 63 y.o.   MRN: [id number]  No chief complaint on file.   HPI Pt is a 63 yo male with pmh sig for A. fib on chronic anticoagulation, and ICM, diastolic CHF, HTN, a flutter, CAD, angina, OSA, hep B, DM, HLD, gout, anemia who was seen today for HFU.  Pt admitted 9/11-9/15/2020 GIB after having sudden CP.  Pt found to be anemic, hgb 8.4, Trpn 22 and 24.  COVID test negative.  CXR negative.  EKG sinus rhythm with 1st degree AV block.  Pt underwent EGD and colonoscopy without source of bleeding identified.  Hemoglobin improved to 9.4 at time of d/c.  Since discharge, patient endorses a additional episodes of CP.  Happened again when taking out the trash at 4 AM.  Feeling occurs with strenuous activity. Pt also endorses dizziness on occasion.  Pt has not taken SL NTG for these chest pain episodes.  Patient states he was last seen by cardiology Dr. Loralie Champagne, several months ago.   Pt eating sausage, bologna, and ham sandwiches.  Pt states he picked up the MVI, but has not started taking it.  Past Medical History:  Diagnosis Date  . Anginal pain ([place])   . Anxiety   . Arrhythmia   . CAD (coronary artery disease), native coronary artery    a. Nonobstructive CAD by cath 2013 - diffuse distal and branch vessel CAD, no severe disease in the major coronaries, LV mild global hypokinesis, EF 45%. b. ETT-Sestamibi 5/14: EF 31%, small fixed inferior defect with no ischemia.  . Cholecystitis   . Chronic CHF ([place])    a. Mixed ICM/NICM (?EtOH). EF 35% in 2008. Echo 5/13: EF 60-65%, mod LVH, EF 45% on V gram in 12/2011. EF 12/2012: EF 50-55%, mild LVH, inferobasal HK, mild MR. ETT-Ses 5/14 EF 41%. Cardiac MRI 5/14: EF 44%, mild global HK, subepicardial delayed enhancement in nonspecific RV insertion pattern.  . COLONIC POLYPS, HX OF 12/30/2006  . Gout   . H/O atrial flutter 2007   a. Ablations in 2007, 2008.  [name] Heart murmur   . HEPATITIS  B, CHRONIC 12/30/2006  . History of alcohol abuse   . History of hiatal hernia   . History of medication noncompliance   . History of nuclear stress test    Myoview 3/17: + chest pain; EF 33%, downsloping ST depression 2, V4-6, LVH with strain, no ischemia on images; intermediate risk   . HYPERCHOLESTEROLEMIA 07/11/2010  . Hypertension   . Left sciatic nerve pain since 04/2015  . LIVER FUNCTION TESTS, ABNORMAL, HX OF 12/30/2006  . MITRAL REGURGITATION 12/30/2006  . 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 PAF (paroxysmal atrial fibrillation) (H[facility])    On coumadin  . Rectal bleeding 12/18/2011   Scheduled for colonoscopy.    . Shortness of breath dyspnea    with excertion  . Sleep apnea    "suppose to send mask but they never did" (05/03/2015)  . Type II diabetes mellitus (H[facility])     Allergies  Allergen Reactions  . Shrimp [Shellfish Allergy] Nausea And Vomiting  . Ace Inhibitors Cough  . Other Hives and Other (See Comments)    Patient reports developing hives after receiving "some antibiotic given in 1980''s at [facility]". He does not know which antibiotic.    ROS General: Denies fever, chills, night sweats, changes in weight, changes in appetite HEENT: Denies headaches, ear  pain, changes in vision, rhinorrhea, sore throat   +dizziness CV: Denies palpitations, SOB, orthopnea  +CP with exertion Pulm: Denies SOB, cough, wheezing GI: Denies abdominal pain, nausea, vomiting, diarrhea, constipation GU: Denies dysuria, hematuria, frequency, vaginal discharge Msk: Denies muscle cramps, joint pains Neuro: Denies weakness, numbness, tingling Skin: Denies rashes, bruising Psych: Denies depression, anxiety, hallucinations      Objective:    Blood pressure 134/72, pulse 76, temperature 97.6 F (36.4 C), temperature source Oral, weight 201 lb (91.2 kg), SpO2 98 %.  Gen. Pleasant, well-nourished, in no distress, normal affect   HEENT: [place]/AT, face symmetric,  conjunctiva clear, no scleral icterus, PERRLA, nares patent without drainage Neck: No JVD, no thyromegaly, no carotid bruits Lungs: no accessory muscle use, CTAB, no wheezes or rales Cardiovascular: RRR, no m/r/g, no peripheral edema Musculoskeletal: No TTP of chest wall. No deformities, no cyanosis or clubbing, normal tone Neuro:  A&Ox3, CN II-XII intact, normal gait Skin:  Warm, no lesions/ rash   Wt Readings from Last 3 Encounters:  05/27/19 201 lb (91.2 kg)  04/28/19 193 lb 12.8 oz (87.9 kg)  04/23/19 197 lb (89.4 kg)    Lab Results  Component Value Date   WBC 7.4 04/28/2019   HGB 9.4 (L) 04/28/2019   HCT 30.0 (L) 04/28/2019   PLT 266 04/28/2019   GLUCOSE 99 04/27/2019   CHOL 139 04/08/2018   TRIG 112 04/08/2018   HDL 49 04/08/2018   LDLDIRECT 111.0 04/22/2015   LDLCALC 68 04/08/2018   ALT 33 03/23/2019   AST 28 03/23/2019   NA 138 04/27/2019   K 3.6 04/27/2019   CL 105 04/27/2019   CREATININE 0.94 04/27/2019   BUN 8 04/27/2019   CO2 26 04/27/2019   TSH 0.553 11/11/2013   PSA 0.91 03/30/2010   INR 1.2 04/24/2019   HGBA1C 6.8 (H) 03/23/2019   MICROALBUR 2.5 (H) 01/26/2016    Assessment/Plan:  Normocytic anemia  -Discussed various causes -Patient advised to schedule follow-up appointment with GI -We will recheck CBC -Patient given precautions.  Patient advised to notify clinic or proceed to ED for worsening symptoms, dark-colored stools or blood in stools. -encouraged to start MVI with iron - Plan: CBC (no diff)  Exertional chest pain  -Pt strongly advised to use SL NTG as needed for continued CP -Patient advised to schedule follow-up appointment with Cardiology as may benefit from stress test. -will obtain lipid panel as fasting. - Plan: Lipid panel  Essential hypertension -Stable -Discussed lifestyle modifications -Continue Norvasc 5 mg, Coreg 25 mg twice daily, hydralazine 50 mg, Imdur 30 mg.  Sotalol 120 mg also on med list. -f/u with  Cardiology  Afib -continue Xarelto  Diastolic CHF -Euvolemic on exam -Continue current meds including Lasix 20 mg -Patient encouraged to limit sodium and continue daily weights -Continue follow-up with cardiology  F/u prn  Grier Mitts, MD

## 2019-05-27 NOTE — Telephone Encounter (Signed)
Received message from patients daughter requesting an appt for patient.  Returned call, daughter states that patient was seen in [place] in ED for chest pain, currently with no chest pain.  He was seen by pcp that recommended patient see cardiologist as patient has low hgb.  Pt also lost to f/u by 1 year.  appt made for November.  Advised to call office if he has any concerns at home.

## 2019-06-23 ENCOUNTER — Telehealth: Payer: Medicare Other | Admitting: Family Medicine

## 2019-06-26 ENCOUNTER — Encounter (HOSPITAL_COMMUNITY): Payer: Self-pay | Admitting: Emergency Medicine

## 2019-06-26 ENCOUNTER — Emergency Department (HOSPITAL_COMMUNITY): Payer: Medicare Other

## 2019-06-26 ENCOUNTER — Other Ambulatory Visit: Payer: Self-pay

## 2019-06-26 ENCOUNTER — Telehealth (HOSPITAL_COMMUNITY): Payer: Self-pay | Admitting: *Deleted

## 2019-06-26 ENCOUNTER — Encounter (HOSPITAL_COMMUNITY): Payer: Medicare Other | Admitting: Cardiology

## 2019-06-26 ENCOUNTER — Inpatient Hospital Stay (HOSPITAL_COMMUNITY)
Admission: EM | Admit: 2019-06-26 | Discharge: 2019-07-02 | DRG: 286 | Disposition: A | Discharge status: Home / Self Care | Payer: Medicare Other | Attending: Internal Medicine | Admitting: Internal Medicine

## 2019-06-26 DIAGNOSIS — T189XXA Foreign body of alimentary tract, part unspecified, initial encounter: Secondary | ICD-10-CM

## 2019-06-26 DIAGNOSIS — I2510 Atherosclerotic heart disease of native coronary artery without angina pectoris: Secondary | ICD-10-CM | POA: Diagnosis present

## 2019-06-26 DIAGNOSIS — Z833 Family history of diabetes mellitus: Secondary | ICD-10-CM

## 2019-06-26 DIAGNOSIS — Z87891 Personal history of nicotine dependence: Secondary | ICD-10-CM

## 2019-06-26 DIAGNOSIS — Z20828 Contact with and (suspected) exposure to other viral communicable diseases: Secondary | ICD-10-CM | POA: Diagnosis present

## 2019-06-26 DIAGNOSIS — K5730 Diverticulosis of large intestine without perforation or abscess without bleeding: Secondary | ICD-10-CM | POA: Diagnosis present

## 2019-06-26 DIAGNOSIS — F419 Anxiety disorder, unspecified: Secondary | ICD-10-CM | POA: Diagnosis present

## 2019-06-26 DIAGNOSIS — D500 Iron deficiency anemia secondary to blood loss (chronic): Secondary | ICD-10-CM | POA: Diagnosis present

## 2019-06-26 DIAGNOSIS — R0602 Shortness of breath: Secondary | ICD-10-CM | POA: Diagnosis not present

## 2019-06-26 DIAGNOSIS — E119 Type 2 diabetes mellitus without complications: Secondary | ICD-10-CM

## 2019-06-26 DIAGNOSIS — Z794 Long term (current) use of insulin: Secondary | ICD-10-CM

## 2019-06-26 DIAGNOSIS — Z888 Allergy status to other drugs, medicaments and biological substances status: Secondary | ICD-10-CM

## 2019-06-26 DIAGNOSIS — Z7901 Long term (current) use of anticoagulants: Secondary | ICD-10-CM

## 2019-06-26 DIAGNOSIS — Z560 Unemployment, unspecified: Secondary | ICD-10-CM

## 2019-06-26 DIAGNOSIS — R531 Weakness: Secondary | ICD-10-CM

## 2019-06-26 DIAGNOSIS — I208 Other forms of angina pectoris: Secondary | ICD-10-CM

## 2019-06-26 DIAGNOSIS — I428 Other cardiomyopathies: Secondary | ICD-10-CM

## 2019-06-26 DIAGNOSIS — Z79899 Other long term (current) drug therapy: Secondary | ICD-10-CM

## 2019-06-26 DIAGNOSIS — Y831 Surgical operation with implant of artificial internal device as the cause of abnormal reaction of the patient, or of later complication, without mention of misadventure at the time of the procedure: Secondary | ICD-10-CM | POA: Diagnosis present

## 2019-06-26 DIAGNOSIS — I4892 Unspecified atrial flutter: Secondary | ICD-10-CM | POA: Diagnosis present

## 2019-06-26 DIAGNOSIS — N183 Chronic kidney disease, stage 3 unspecified: Secondary | ICD-10-CM | POA: Diagnosis present

## 2019-06-26 DIAGNOSIS — N179 Acute kidney failure, unspecified: Secondary | ICD-10-CM | POA: Diagnosis not present

## 2019-06-26 DIAGNOSIS — T82855A Stenosis of coronary artery stent, initial encounter: Secondary | ICD-10-CM | POA: Diagnosis present

## 2019-06-26 DIAGNOSIS — Z91013 Allergy to seafood: Secondary | ICD-10-CM

## 2019-06-26 DIAGNOSIS — D631 Anemia in chronic kidney disease: Secondary | ICD-10-CM | POA: Insufficient documentation

## 2019-06-26 DIAGNOSIS — I480 Paroxysmal atrial fibrillation: Secondary | ICD-10-CM | POA: Diagnosis present

## 2019-06-26 DIAGNOSIS — E1122 Type 2 diabetes mellitus with diabetic chronic kidney disease: Secondary | ICD-10-CM | POA: Diagnosis present

## 2019-06-26 DIAGNOSIS — G4730 Sleep apnea, unspecified: Secondary | ICD-10-CM | POA: Diagnosis present

## 2019-06-26 DIAGNOSIS — I340 Nonrheumatic mitral (valve) insufficiency: Secondary | ICD-10-CM | POA: Diagnosis present

## 2019-06-26 DIAGNOSIS — Z8249 Family history of ischemic heart disease and other diseases of the circulatory system: Secondary | ICD-10-CM

## 2019-06-26 DIAGNOSIS — G4733 Obstructive sleep apnea (adult) (pediatric): Secondary | ICD-10-CM | POA: Diagnosis present

## 2019-06-26 DIAGNOSIS — E7800 Pure hypercholesterolemia, unspecified: Secondary | ICD-10-CM | POA: Diagnosis present

## 2019-06-26 DIAGNOSIS — I5033 Acute on chronic diastolic (congestive) heart failure: Secondary | ICD-10-CM | POA: Diagnosis present

## 2019-06-26 DIAGNOSIS — I130 Hypertensive heart and chronic kidney disease with heart failure and stage 1 through stage 4 chronic kidney disease, or unspecified chronic kidney disease: Principal | ICD-10-CM | POA: Diagnosis present

## 2019-06-26 DIAGNOSIS — B181 Chronic viral hepatitis B without delta-agent: Secondary | ICD-10-CM | POA: Diagnosis present

## 2019-06-26 DIAGNOSIS — M109 Gout, unspecified: Secondary | ICD-10-CM | POA: Diagnosis present

## 2019-06-26 DIAGNOSIS — I10 Essential (primary) hypertension: Secondary | ICD-10-CM | POA: Diagnosis present

## 2019-06-26 DIAGNOSIS — K649 Unspecified hemorrhoids: Secondary | ICD-10-CM | POA: Diagnosis present

## 2019-06-26 LAB — TROPONIN I (HIGH SENSITIVITY)
Troponin I (High Sensitivity): 12 ng/L (ref <18)
Troponin I (High Sensitivity): 12 ng/L (ref <18)

## 2019-06-26 LAB — BASIC METABOLIC PANEL
Anion gap: 13 (ref 5–15)
BUN: 13 mg/dL (ref 8–23)
CO2: 21 mmol/L — ABNORMAL LOW (ref 22–32)
Calcium: 9 mg/dL (ref 8.9–10.3)
Chloride: 104 mmol/L (ref 98–111)
Creatinine, Ser: 1.31 mg/dL — ABNORMAL HIGH (ref 0.61–1.24)
GFR calc Af Amer: >60 mL/min (ref >60)
GFR calc non Af Amer: 57 mL/min — ABNORMAL LOW (ref >60)
Glucose, Bld: 222 mg/dL — ABNORMAL HIGH (ref 70–99)
Potassium: 3.4 mmol/L — ABNORMAL LOW (ref 3.5–5.1)
Sodium: 138 mmol/L (ref 135–145)

## 2019-06-26 LAB — CBC
HCT: 32.8 % — ABNORMAL LOW (ref 39.0–52.0)
Hemoglobin: 9.4 g/dL — ABNORMAL LOW (ref 13.0–17.0)
MCH: 21.3 pg — ABNORMAL LOW (ref 26.0–34.0)
MCHC: 28.7 g/dL — ABNORMAL LOW (ref 30.0–36.0)
MCV: 74.2 fL — ABNORMAL LOW (ref 80.0–100.0)
Platelets: 316 10*3/uL (ref 150–400)
RBC: 4.42 MIL/uL (ref 4.22–5.81)
RDW: 19 % — ABNORMAL HIGH (ref 11.5–15.5)
WBC: 6.7 10*3/uL (ref 4.0–10.5)
nRBC: 0 % (ref 0.0–0.2)

## 2019-06-26 LAB — CK: Total CK: 80 U/L (ref 49–397)

## 2019-06-26 LAB — D-DIMER, QUANTITATIVE: D-Dimer, Quant: <0.27 ug/mL-FEU (ref 0.00–0.50)

## 2019-06-26 LAB — TSH: TSH: 0.808 u[IU]/mL (ref 0.350–4.500)

## 2019-06-26 LAB — BRAIN NATRIURETIC PEPTIDE: B Natriuretic Peptide: 290.8 pg/mL — ABNORMAL HIGH (ref 0.0–100.0)

## 2019-06-26 MED ORDER — LACTATED RINGERS IV BOLUS
1000.0000 mL | Freq: Once | INTRAVENOUS | Status: AC
  Administered 2019-06-26: 1000 mL via INTRAVENOUS

## 2019-06-26 MED ORDER — SODIUM CHLORIDE 0.9% FLUSH
3.0000 mL | Freq: Once | INTRAVENOUS | Status: AC
  Administered 2019-06-26: 3 mL via INTRAVENOUS

## 2019-06-26 NOTE — Telephone Encounter (Signed)
Barnett Applebaum May, clinic front office rep called me on the triage phone to notify me that patients daughter called complaining that patient was trying to check in at admissions and was told his appt was r/s. Pt had an office visit scheduled w/Dr.McLean for today at 3:20pm but cancelled his clinic due to a conference he had to attend. Barnett Applebaum called patient on primary number in patients chart on Tuesday to r/s patient. Patient did not answer she left vm and he did not return the call. Gina then r/s him until 1/5 per Dr.McLean ("schedule next available appt"). Barnett Applebaum explained to the daughter that we did not have any providers in clinic to see patient and if he was feeling really bad he needs to go to the emergency room. Pts daughter then asked to speak with someone clinical. Barnett Applebaum transferred the call to me and patients daughter asked if I was an Therapist, sports. I told her that I was not that I am a CMA that handles triage calls in the clinic. She asked if a RN was available I told her we did not have an RN available this afternoon. I asked her if there was anything I could help her with. She said that patient needed to be seen by Dr.McLean because pt was already here in the hospital. I informed her that Dr.McLean was in a conference that is the reason Barnett Applebaum called to r/s patient a few days ago. She said she did was upset that patients appt had been r/s and asked why we left a VM on patients phone because he does not check his voicemail. I told her we were not aware that he does not check his voicemail that our office contacts the primary number listed in patients chart. I offered w/patients permission to change the primary contact to her number. She declined and asked again why patients appt was moved until January. I explained to her that Dr.McLeans next available appt is in January but if patient is having issues before then we can always work him in the clinic. We were unaware patient was having issues because he did not answer the phone  or return the call when Barnett Applebaum needed to r/s him. I apologized and offered to schedule pt a sooner appt. She declined and asked for my office managers number. I gave her Blair Hailey phone number and again asked if there was anything else I could do to assist she said no thanked me and ended the call.

## 2019-06-26 NOTE — ED Provider Notes (Signed)
[facility] Provider Note   CSN: [id number] Arrival date & time: 06/26/19  1529     History   Chief Complaint Chief Complaint  Patient presents with  . Shortness of Breath    HPI [name] [name] is a 63 y.o. male.     HPI  Patient presents today for "heart strain" that always occurs when he exerts himself by doing so much is taking out the trash.  The last episode of this was 2 days ago when he took out the trash, became so tired and tachypneic that he had to rest all of yesterday in bed.  He reports that he does not any nausea, emesis, no associated symptoms.  He is unable to describe the heart strain feeling beyond that it is not sharp, does not radiate, and endorses that it feels as if something is sticking in his chest.  He has not tried anything for symptoms, nothing seems to make his symptoms better, exertion makes his symptoms worse.  Past Medical History:  Diagnosis Date  . Anginal pain ([place])   . Anxiety   . Arrhythmia   . CAD (coronary artery disease), native coronary artery    a. Nonobstructive CAD by cath 2013 - diffuse distal and branch vessel CAD, no severe disease in the major coronaries, LV mild global hypokinesis, EF 45%. b. ETT-Sestamibi 5/14: EF 31%, small fixed inferior defect with no ischemia.  . Cholecystitis   . Chronic CHF ([place])    a. Mixed ICM/NICM (?EtOH). EF 35% in 2008. Echo 5/13: EF 60-65%, mod LVH, EF 45% on V gram in 12/2011. EF 12/2012: EF 50-55%, mild LVH, inferobasal HK, mild MR. ETT-Ses 5/14 EF 41%. Cardiac MRI 5/14: EF 44%, mild global HK, subepicardial delayed enhancement in nonspecific RV insertion pattern.  . COLONIC POLYPS, HX OF 12/30/2006  . Gout   . H/O atrial flutter 2007   a. Ablations in 2007, 2008.  [name] Heart murmur   . HEPATITIS B, CHRONIC 12/30/2006  . History of alcohol abuse   . History of hiatal hernia   . History of medication noncompliance   . History of nuclear stress test    Myoview 3/17: + chest pain; EF 33%, downsloping ST depression 2, V4-6, LVH with strain, no ischemia on images; intermediate risk   . HYPERCHOLESTEROLEMIA 07/11/2010  . Hypertension   . Left sciatic nerve pain since 04/2015  . LIVER FUNCTION TESTS, ABNORMAL, HX OF 12/30/2006  . MITRAL REGURGITATION 12/30/2006  . 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 PAF (paroxysmal atrial fibrillation) (HCC)    On coumadin  . Rectal bleeding 12/18/2011   Scheduled for colonoscopy.    . Shortness of breath dyspnea    with excertion  . Sleep apnea    "suppose to send mask but they never did" (05/03/2015)  . Type II diabetes mellitus [facility])     Patient Active Problem List   Diagnosis Date Noted  . Generalized weakness 06/27/2019  . Acute on chronic congestive heart failure with left ventricular diastolic dysfunction ([place]) 06/26/2019  . Anemia due to chronic kidney disease 06/26/2019  . CKD (chronic kidney disease), stage III 06/26/2019  . Type 2 diabetes mellitus without complication ([place]) 02/77/4128  . Iron deficiency anemia due to chronic blood loss 06/26/2019  . History of atrial fibrillation   . History of type 2 diabetes mellitus   . Anemia   . Chest pain 04/24/2019  . Overweight (BMI 25.0-29.9) 02/28/2017  .  Unstable angina ([name]) 08/03/2016  . S/P laparoscopic cholecystectomy 02/07/2016  . Coronary artery disease involving native coronary artery with unstable angina pectoris ([place]) 12/29/2015  . Diabetes ([place]) 11/12/2015  . Chronic anticoagulation 08/22/2015  . Atrial flutter with controlled response ([place]) 08/22/2015  . RUQ pain   . Cholelithiases- drain in place 06/04/2015  . Essential hypertension   . Chronic diastolic CHF (congestive heart failure) ([place]) 04/24/2015  . Excessive daytime sleepiness 11/25/2014  . Obstructive sleep apnea 11/25/2014  . NICM (nonischemic cardiomyopathy) ([place]) 03/11/2014  . Anticoagulation goal of INR 2 to 3 10/19/2013  . Eczema 10/19/2013  . Low  libido 10/16/2013  . Persistent atrial fibrillation ([place]) 10/06/2013  . Rectal bleeding 12/18/2011  . HYPERCHOLESTEROLEMIA 07/11/2010  . DENTAL CARIES 06/30/2010  . GOUT 05/05/2009  . HEPATITIS B, CHRONIC 12/30/2006  . OSTEOCHONDROMA 12/30/2006  . Mitral valve disorder 12/30/2006  . LIVER FUNCTION TESTS, ABNORMAL, HX OF 12/30/2006  . COLONIC POLYPS, HX OF 12/30/2006  . COLONOSCOPY, HX OF 05/12/2001    Past Surgical History:  Procedure Laterality Date  . A FLUTTER ABLATION  2007, 2008   catheter ablation   . CARDIAC CATHETERIZATION N/A 05/03/2015   Procedure: Right/Left Heart Cath and Coronary Angiography;  Surgeon: Larey Dresser, MD;  Location: [facility] CV LAB;  Service: Cardiovascular;  Laterality: N/A;  . CARDIAC CATHETERIZATION N/A 05/03/2015   Procedure: Coronary Stent Intervention;  Surgeon: Jettie Booze, MD;  Location: [facility] CV LAB;  Service: Cardiovascular;  Laterality: N/A;  . CARDIAC CATHETERIZATION N/A 11/04/2015   Procedure: Left Heart Cath and Coronary Angiography;  Surgeon: Larey Dresser, MD;  Location: [facility] CV LAB;  Service: Cardiovascular;  Laterality: N/A;  . CARDIOVERSION N/A 08/24/2015   Procedure: CARDIOVERSION;  Surgeon: Thayer Headings, MD;  Location: [facility] ENDOSCOPY;  Service: Cardiovascular;  Laterality: N/A;  . CHOLECYSTECTOMY N/A 02/07/2016   Procedure: LAPAROSCOPIC CHOLECYSTECTOMY WITH  INTRAOPERATIVE CHOLANGIOGRAM;  Surgeon: Greer Pickerel, MD;  Location: [facility] ORS;  Service: General;  Laterality: N/A;  . COLONOSCOPY WITH PROPOFOL N/A 04/28/2019   Procedure: COLONOSCOPY WITH PROPOFOL;  Surgeon: Ronnette Juniper, MD;  Location: [facility];  Service: Gastroenterology;  Laterality: N/A;  . CORONARY ANGIOPLASTY  12/05/01  . ESOPHAGOGASTRODUODENOSCOPY (EGD) WITH PROPOFOL N/A 04/27/2019   Procedure: ESOPHAGOGASTRODUODENOSCOPY (EGD) WITH PROPOFOL;  Surgeon: Ronnette Juniper, MD;  Location: [facility];  Service: Gastroenterology;  Laterality: N/A;  . LEFT HEART  CATH AND CORONARY ANGIOGRAPHY N/A 12/28/2016   Procedure: Left Heart Cath and Coronary Angiography;  Surgeon: Larey Dresser, MD;  Location: [place] CV LAB;  Service: Cardiovascular;  Laterality: N/A;  . LEFT HEART CATHETERIZATION WITH CORONARY ANGIOGRAM N/A 12/19/2011   Procedure: LEFT HEART CATHETERIZATION WITH CORONARY ANGIOGRAM;  Surgeon: Larey Dresser, MD;  Location: [facility] CATH LAB;  Service: Cardiovascular;  Laterality: N/A;  . OSTEOCHONDROMA EXCISION Left 1972   "took bone tumor off my shoulder"  . POLYPECTOMY  04/28/2019   Procedure: POLYPECTOMY;  Surgeon: Ronnette Juniper, MD;  Location: [facility] ENDOSCOPY;  Service: Gastroenterology;;        Home Medications    Prior to Admission medications   Medication Sig Start Date End Date Taking? Authorizing Provider  acetaminophen (TYLENOL) 500 MG tablet Take 1,000 mg by mouth every 6 (six) hours as needed (pain).   Yes [provider]  amLODipine (NORVASC) 5 MG tablet Take 1 tablet (5 mg total) by mouth daily. 09/29/18  Yes Charlott Rakes, MD  atorvastatin (LIPITOR) 80 MG tablet Take 1 tablet (  80 mg total) by mouth daily. 09/29/18  Yes Charlott Rakes, MD  carvedilol (COREG) 25 MG tablet Take 1 tablet (25 mg total) by mouth 2 (two) times daily with a meal. 09/29/18  Yes Newlin, Enobong, MD  furosemide (LASIX) 20 MG tablet TAKE TWO TABLETS BY MOUTH IN THE MORNING, THEN TAKE ONE IN THE EVENING Patient taking differently: Take 20-40 mg by mouth See admin instructions. Take 2 tablets (40 mg) by mouth every morning and 1 tablet (20 mg) in the evening 09/29/18  Yes Newlin, Enobong, MD  guaiFENesin (MUCINEX PO) Take 1 tablet by mouth 2 (two) times daily as needed (cough/congestion).   Yes [provider]  hydrALAZINE (APRESOLINE) 50 MG tablet Take 1.5 tablets (75 mg total) by mouth 2 (two) times daily. 04/28/19  Yes Swayze, Ava, DO  Insulin Glargine (LANTUS SOLOSTAR) 100 UNIT/ML Solostar Pen Inject 35 Units into the skin every morning. And pen  needles 1/day Patient taking differently: Inject 35 Units into the skin daily after breakfast.  07/08/18  Yes Newlin, Charlane Ferretti, MD  isosorbide mononitrate (IMDUR) 30 MG 24 hr tablet Take 1 tablet (30 mg total) by mouth daily. 10/28/18  Yes Larey Dresser, MD  metFORMIN (GLUCOPHAGE) 1000 MG tablet Take 1 tablet (1,000 mg total) by mouth 2 (two) times daily with a meal. 05/02/19  Yes [name], MD  nitroGLYCERIN (NITROSTAT) 0.4 MG SL tablet Place 1 tablet (0.4 mg total) under the tongue every 5 (five) minutes as needed for chest pain (x 3 tabs daily). Patient taking differently: Place 0.4 mg under the tongue every 5 (five) minutes x 3 doses as needed for chest pain.  09/29/18  Yes Charlott Rakes, MD  pantoprazole (PROTONIX) 40 MG tablet Take 1 tablet (40 mg total) by mouth daily. 04/28/19 04/27/20 Yes Swayze, Ava, DO  rivaroxaban (XARELTO) 20 MG TABS tablet Take 1 tablet (20 mg total) by mouth daily. 04/29/19  Yes Swayze, Ava, DO  sotalol (BETAPACE) 120 MG tablet TAKE 1 TABLET BY MOUTH EVERY 12 HOURS Patient taking differently: Take 120 mg by mouth every 12 (twelve) hours.  04/03/19  Yes Charlott Rakes, MD  ACCU-CHEK SOFTCLIX LANCETS lancets Use as instructed for three times daily testing of blood glucose 09/06/16   Jegede, Olugbemiga E, MD  Blood Glucose Monitoring Suppl (ACCU-CHEK AVIVA PLUS) w/Device KIT Use as directed for 3 times daily testing of blood glucose 09/06/16   Jegede, Olugbemiga E, MD  glucose blood (ACCU-CHEK AVIVA PLUS) test strip Use as instructed for 3 times daily testing of blood glucose 09/06/16   Tresa Garter, MD  Lancet Devices [facility]) lancets Use as instructed for 3 times daily testing of blood glucose 09/06/16   Tresa Garter, MD  Multiple Vitamin (MULTIVITAMIN WITH MINERALS) TABS tablet Take 1 tablet by mouth daily. Patient not taking: Reported on 06/26/2019 04/29/19   Swayze, Ava, DO  ondansetron (ZOFRAN-ODT) 4 MG disintegrating tablet Take 1 tablet  (4 mg total) by mouth every 8 (eight) hours as needed for nausea or vomiting. Patient not taking: Reported on 06/26/2019 03/13/19   Zigmund Gottron, NP  TRUEPLUS LANCETS 28G MISC 1 each by Does not apply route 3 (three) times daily. 08/22/16   Tresa Garter, MD    Family History Family History  Problem Relation Age of Onset  . Diabetes Mother   . Hypertension Mother   . Heart attack Neg Hx   . Stroke Neg Hx     Social History Social History  Tobacco Use  . Smoking status: Former Smoker    Packs/day: 4.00    Years: 18.00    Pack years: 72.00    Types: Cigarettes    Quit date: 08/14/1983    Years since quitting: 35.8  . Smokeless tobacco: Never Used  Substance Use Topics  . Alcohol use: Yes    Alcohol/week: 16.0 standard drinks    Types: 6 Cans of beer, 10 Shots of liquor per week    Comment: occasionally  . Drug use: Yes    Types: Marijuana    Comment: occasionally     Allergies   Shrimp [shellfish allergy], Ace inhibitors, and Other   Review of Systems Review of Systems  Constitutional: Negative for fever.  Respiratory: Positive for chest tightness. Negative for cough and shortness of breath.   Cardiovascular: Negative for chest pain and leg swelling.  Gastrointestinal: Negative for abdominal pain, nausea and vomiting.  Musculoskeletal: Negative for back pain.  Skin: Negative for rash and wound.  Neurological: Negative for syncope.  All other systems reviewed and are negative.    Physical Exam Updated Vital Signs BP (!) 143/82   Pulse 62   Temp 98.2 F (36.8 C) (Oral)   Resp 19   Ht 6' (1.829 m)   Wt 95.3 kg   SpO2 98%   BMI 28.48 kg/m   Physical Exam Vitals signs and nursing note reviewed.  Constitutional:      Appearance: He is well-developed. He is obese.  HENT:     Head: Normocephalic and atraumatic.  Eyes:     Conjunctiva/sclera: Conjunctivae normal.  Neck:     Musculoskeletal: Neck supple.  Cardiovascular:     Rate and Rhythm:  Normal rate and regular rhythm.  Pulmonary:     Effort: Pulmonary effort is normal. No respiratory distress.     Breath sounds: Normal breath sounds.     Comments: Tachypneic without accessory muscle usage when ambulating, does not drop his oxygen saturations, satting well on room air Chest:     Chest wall: Tenderness present.  Abdominal:     Palpations: Abdomen is soft.  Skin:    General: Skin is warm and dry.  Neurological:     General: No focal deficit present.     Mental Status: He is alert and oriented to person, place, and time.     Cranial Nerves: No cranial nerve deficit.     Motor: No weakness.  Psychiatric:        Mood and Affect: Mood normal.        Behavior: Behavior normal.      ED Treatments / Results  Labs (all labs ordered are listed, but only abnormal results are displayed) Labs Reviewed  BASIC METABOLIC PANEL - Abnormal; Notable for the following components:      Result Value   Potassium 3.4 (*)    CO2 21 (*)    Glucose, Bld 222 (*)    Creatinine, Ser 1.31 (*)    GFR calc non Af Amer 57 (*)    All other components within normal limits  CBC - Abnormal; Notable for the following components:   Hemoglobin 9.4 (*)    HCT 32.8 (*)    MCV 74.2 (*)    MCH 21.3 (*)    MCHC 28.7 (*)    RDW 19.0 (*)    All other components within normal limits  BRAIN NATRIURETIC PEPTIDE - Abnormal; Notable for the following components:   B Natriuretic Peptide 290.8 (*)  All other components within normal limits  SARS CORONAVIRUS 2 (TAT 6-24 HRS)  SARS CORONAVIRUS 2 (TAT 6-24 HRS)  CULTURE, BLOOD (ROUTINE X 2)  CULTURE, BLOOD (ROUTINE X 2)  CK  TSH  D-DIMER, QUANTITATIVE (NOT AT [facility])  LACTIC ACID, PLASMA  LACTIC ACID, PLASMA  CBC WITH DIFFERENTIAL/PLATELET  COMPREHENSIVE METABOLIC PANEL  D-DIMER, QUANTITATIVE (NOT AT [facility])  PROCALCITONIN  LACTATE DEHYDROGENASE  FERRITIN  TRIGLYCERIDES  FIBRINOGEN  C-REACTIVE PROTEIN  TROPONIN I (HIGH SENSITIVITY)  TROPONIN I  (HIGH SENSITIVITY)    EKG EKG Interpretation  Date/Time:  Friday June 26 2019 15:53:26 EST Ventricular Rate:  53 PR Interval:    QRS Duration: 96 QT Interval:  472 QTC Calculation: 442 R Axis:   74 Text Interpretation: Atrial flutter with variable A-V block ST & T wave abnormality, consider inferior ischemia Abnormal ECG No STEMI Confirmed by Nanda Quinton [phone]) on 06/26/2019 7:12:12 PM   Radiology Dg Chest Portable 1 View  Result Date: 06/26/2019 CLINICAL DATA:  Shortness of breath. Intermittent chest pain. EXAM: PORTABLE CHEST 1 VIEW COMPARISON:  Frontal and lateral views radiograph 04/24/2019 FINDINGS: Mild cardiomegaly, not significantly changed allowing for differences in technique. Unchanged mediastinal contours. No pulmonary edema, focal airspace disease, pleural effusion or pneumothorax. No acute osseous abnormalities are seen. IMPRESSION: Stable cardiomegaly. No acute abnormality. Electronically Signed   By: Keith Rake M.D.   On: 06/26/2019 18:38    Procedures Procedures (including critical care time)  Medications Ordered in ED Medications  ferric gluconate (NULECIT) 125 mg in sodium chloride 0.9 % 100 mL IVPB (has no administration in time range)  insulin aspart (novoLOG) injection 0-15 Units (has no administration in time range)  sodium chloride flush (NS) 0.9 % injection 3 mL (3 mLs Intravenous Given 06/26/19 1828)  lactated ringers bolus 1,000 mL (0 mLs Intravenous Stopped 06/26/19 2134)     Initial Impression / Assessment and Plan / ED Course  I have reviewed the triage vital signs and the nursing notes.  Pertinent labs & imaging results that were available during my care of the patient were reviewed by me and considered in my medical decision making (see chart for details).     HEAR Score: 5  Tyjae Issa is a 63 y.o. male with a history of atrial flutter and ongoing GI bleed with normal recent colonoscopy and EGD presenting today for  dyspnea on exertion.  Labs and imaging sent for differential diagnosis of pneumonia, sepsis, ACS, pneumothorax, electrolyte abnormality, anemia.  Labs showed a stable hemoglobin the previous at 9.4, EKG showed atrial flutter 4-1 rate controlled.  Patient is symptomatically still not at his baseline.  Delta troponin negative.  D-dimer was negative.  Cardiology consulted, and they feel that this is likely not Cardiologic in nature but considering atrial flutter in symptomology, they will see patient in the morning.  Dr. Chyrl Civatte given handoff for hospital medicine.  Plan to admit for further evaluation and management and need for observation considering dyspnea on exertion.  Care patient discussed with the supervising attending.  Final Clinical Impressions(s) / ED Diagnoses   Final diagnoses:  SOB (shortness of breath)  Atrial flutter, unspecified type [facility])    ED Discharge Orders    None       Julianne Rice, MD 06/27/19 [id number]    Margette Fast, MD 06/27/19 1213

## 2019-06-26 NOTE — ED Triage Notes (Signed)
Reports SOB for a week and a half with intermittent chest pain feels as if something is sticking in his chest.  Denies any n/v but endorses some diarrhea that started today.

## 2019-06-27 ENCOUNTER — Observation Stay (HOSPITAL_COMMUNITY): Payer: Medicare Other

## 2019-06-27 ENCOUNTER — Encounter (HOSPITAL_COMMUNITY): Payer: Self-pay

## 2019-06-27 DIAGNOSIS — R531 Weakness: Secondary | ICD-10-CM

## 2019-06-27 DIAGNOSIS — N183 Chronic kidney disease, stage 3 unspecified: Secondary | ICD-10-CM | POA: Diagnosis not present

## 2019-06-27 DIAGNOSIS — Z7901 Long term (current) use of anticoagulants: Secondary | ICD-10-CM

## 2019-06-27 DIAGNOSIS — I4892 Unspecified atrial flutter: Secondary | ICD-10-CM

## 2019-06-27 DIAGNOSIS — D500 Iron deficiency anemia secondary to blood loss (chronic): Secondary | ICD-10-CM

## 2019-06-27 DIAGNOSIS — I5033 Acute on chronic diastolic (congestive) heart failure: Secondary | ICD-10-CM | POA: Diagnosis not present

## 2019-06-27 DIAGNOSIS — I208 Other forms of angina pectoris: Secondary | ICD-10-CM | POA: Diagnosis not present

## 2019-06-27 DIAGNOSIS — I428 Other cardiomyopathies: Secondary | ICD-10-CM | POA: Diagnosis not present

## 2019-06-27 DIAGNOSIS — I10 Essential (primary) hypertension: Secondary | ICD-10-CM

## 2019-06-27 LAB — COMPREHENSIVE METABOLIC PANEL
ALT: 19 U/L (ref 0–44)
AST: 14 U/L — ABNORMAL LOW (ref 15–41)
Albumin: 3.2 g/dL — ABNORMAL LOW (ref 3.5–5.0)
Alkaline Phosphatase: 52 U/L (ref 38–126)
Anion gap: 10 (ref 5–15)
BUN: 13 mg/dL (ref 8–23)
CO2: 21 mmol/L — ABNORMAL LOW (ref 22–32)
Calcium: 8.9 mg/dL (ref 8.9–10.3)
Chloride: 109 mmol/L (ref 98–111)
Creatinine, Ser: 1.08 mg/dL (ref 0.61–1.24)
GFR calc Af Amer: >60 mL/min (ref >60)
GFR calc non Af Amer: >60 mL/min (ref >60)
Glucose, Bld: 93 mg/dL (ref 70–99)
Potassium: 3.6 mmol/L (ref 3.5–5.1)
Sodium: 140 mmol/L (ref 135–145)
Total Bilirubin: 1 mg/dL (ref 0.3–1.2)
Total Protein: 6.9 g/dL (ref 6.5–8.1)

## 2019-06-27 LAB — BASIC METABOLIC PANEL
Anion gap: 10 (ref 5–15)
BUN: 11 mg/dL (ref 8–23)
CO2: 20 mmol/L — ABNORMAL LOW (ref 22–32)
Calcium: 8.3 mg/dL — ABNORMAL LOW (ref 8.9–10.3)
Chloride: 109 mmol/L (ref 98–111)
Creatinine, Ser: 0.97 mg/dL (ref 0.61–1.24)
GFR calc Af Amer: >60 mL/min (ref >60)
GFR calc non Af Amer: >60 mL/min (ref >60)
Glucose, Bld: 118 mg/dL — ABNORMAL HIGH (ref 70–99)
Potassium: 3.7 mmol/L (ref 3.5–5.1)
Sodium: 139 mmol/L (ref 135–145)

## 2019-06-27 LAB — LACTIC ACID, PLASMA
Lactic Acid, Venous: 1.1 mmol/L (ref 0.5–1.9)
Lactic Acid, Venous: 1.8 mmol/L (ref 0.5–1.9)

## 2019-06-27 LAB — CBC
HCT: 29.5 % — ABNORMAL LOW (ref 39.0–52.0)
Hemoglobin: 8.5 g/dL — ABNORMAL LOW (ref 13.0–17.0)
MCH: 21.3 pg — ABNORMAL LOW (ref 26.0–34.0)
MCHC: 28.8 g/dL — ABNORMAL LOW (ref 30.0–36.0)
MCV: 73.9 fL — ABNORMAL LOW (ref 80.0–100.0)
Platelets: 284 10*3/uL (ref 150–400)
RBC: 3.99 MIL/uL — ABNORMAL LOW (ref 4.22–5.81)
RDW: 18.8 % — ABNORMAL HIGH (ref 11.5–15.5)
WBC: 7 10*3/uL (ref 4.0–10.5)
nRBC: 0 % (ref 0.0–0.2)

## 2019-06-27 LAB — CBC WITH DIFFERENTIAL/PLATELET
Abs Immature Granulocytes: 0.03 10*3/uL (ref 0.00–0.07)
Basophils Absolute: 0 10*3/uL (ref 0.0–0.1)
Basophils Relative: 0 %
Eosinophils Absolute: 0.2 10*3/uL (ref 0.0–0.5)
Eosinophils Relative: 3 %
HCT: 33.7 % — ABNORMAL LOW (ref 39.0–52.0)
Hemoglobin: 9.7 g/dL — ABNORMAL LOW (ref 13.0–17.0)
Immature Granulocytes: 0 %
Lymphocytes Relative: 24 %
Lymphs Abs: 1.8 10*3/uL (ref 0.7–4.0)
MCH: 21.5 pg — ABNORMAL LOW (ref 26.0–34.0)
MCHC: 28.8 g/dL — ABNORMAL LOW (ref 30.0–36.0)
MCV: 74.6 fL — ABNORMAL LOW (ref 80.0–100.0)
Monocytes Absolute: 0.7 10*3/uL (ref 0.1–1.0)
Monocytes Relative: 10 %
Neutro Abs: 4.8 10*3/uL (ref 1.7–7.7)
Neutrophils Relative %: 63 %
Platelets: 300 10*3/uL (ref 150–400)
RBC: 4.52 MIL/uL (ref 4.22–5.81)
RDW: 18.8 % — ABNORMAL HIGH (ref 11.5–15.5)
WBC: 7.6 10*3/uL (ref 4.0–10.5)
nRBC: 0 % (ref 0.0–0.2)

## 2019-06-27 LAB — LACTATE DEHYDROGENASE: LDH: 143 U/L (ref 98–192)

## 2019-06-27 LAB — GLUCOSE, CAPILLARY
Glucose-Capillary: 200 mg/dL — ABNORMAL HIGH (ref 70–99)
Glucose-Capillary: 106 mg/dL — ABNORMAL HIGH (ref 70–99)

## 2019-06-27 LAB — C-REACTIVE PROTEIN: CRP: 1 mg/dL — ABNORMAL HIGH (ref <1.0)

## 2019-06-27 LAB — CBG MONITORING, ED
Glucose-Capillary: 113 mg/dL — ABNORMAL HIGH (ref 70–99)
Glucose-Capillary: 155 mg/dL — ABNORMAL HIGH (ref 70–99)

## 2019-06-27 LAB — D-DIMER, QUANTITATIVE: D-Dimer, Quant: 0.32 ug/mL-FEU (ref 0.00–0.50)

## 2019-06-27 LAB — HEMOGLOBIN A1C
Hgb A1c MFr Bld: 6.6 % — ABNORMAL HIGH (ref 4.8–5.6)
Mean Plasma Glucose: 142.72 mg/dL

## 2019-06-27 LAB — SARS CORONAVIRUS 2 (TAT 6-24 HRS): SARS Coronavirus 2: NEGATIVE

## 2019-06-27 LAB — TRIGLYCERIDES: Triglycerides: 62 mg/dL (ref <150)

## 2019-06-27 LAB — FERRITIN: Ferritin: 18 ng/mL — ABNORMAL LOW (ref 24–336)

## 2019-06-27 LAB — FIBRINOGEN: Fibrinogen: 600 mg/dL — ABNORMAL HIGH (ref 210–475)

## 2019-06-27 LAB — MAGNESIUM: Magnesium: 1.9 mg/dL (ref 1.7–2.4)

## 2019-06-27 LAB — PROCALCITONIN: Procalcitonin: <0.1 ng/mL

## 2019-06-27 MED ORDER — ATORVASTATIN CALCIUM 80 MG PO TABS
80.0000 mg | ORAL_TABLET | Freq: Every day | ORAL | Status: DC
  Administered 2019-06-27 – 2019-07-02 (×6): 80 mg via ORAL
  Filled 2019-06-27 (×6): qty 1

## 2019-06-27 MED ORDER — ISOSORBIDE MONONITRATE ER 30 MG PO TB24
30.0000 mg | ORAL_TABLET | Freq: Every day | ORAL | Status: DC
  Administered 2019-06-27: 30 mg via ORAL
  Filled 2019-06-27 (×2): qty 1

## 2019-06-27 MED ORDER — SOTALOL HCL 80 MG PO TABS
120.0000 mg | ORAL_TABLET | Freq: Two times a day (BID) | ORAL | Status: DC
  Administered 2019-06-27 – 2019-07-02 (×10): 120 mg via ORAL
  Filled 2019-06-27 (×5): qty 2
  Filled 2019-06-27 (×2): qty 1
  Filled 2019-06-27 (×7): qty 2

## 2019-06-27 MED ORDER — INSULIN GLARGINE 100 UNIT/ML SOLOSTAR PEN: 35.0000 [IU] | PEN_INJECTOR | Freq: Every day | SUBCUTANEOUS | Status: DC

## 2019-06-27 MED ORDER — POTASSIUM CHLORIDE CRYS ER 20 MEQ PO TBCR
40.0000 meq | EXTENDED_RELEASE_TABLET | Freq: Once | ORAL | Status: AC
  Administered 2019-06-27: 40 meq via ORAL
  Filled 2019-06-27: qty 2

## 2019-06-27 MED ORDER — NITROGLYCERIN 0.4 MG SL SUBL: 0.4000 mg | SUBLINGUAL_TABLET | SUBLINGUAL | Status: DC | PRN

## 2019-06-27 MED ORDER — CARVEDILOL 25 MG PO TABS
25.0000 mg | ORAL_TABLET | Freq: Two times a day (BID) | ORAL | Status: DC
  Administered 2019-06-27 – 2019-07-02 (×11): 25 mg via ORAL
  Filled 2019-06-27 (×10): qty 1
  Filled 2019-06-27: qty 2

## 2019-06-27 MED ORDER — FUROSEMIDE 20 MG PO TABS: 20.0000 mg | ORAL_TABLET | ORAL | Status: DC

## 2019-06-27 MED ORDER — HYDRALAZINE HCL 50 MG PO TABS
75.0000 mg | ORAL_TABLET | Freq: Two times a day (BID) | ORAL | Status: DC
  Administered 2019-06-27 – 2019-07-02 (×11): 75 mg via ORAL
  Filled 2019-06-27 (×8): qty 1
  Filled 2019-06-27: qty 3
  Filled 2019-06-27 (×2): qty 1
  Filled 2019-06-27: qty 3

## 2019-06-27 MED ORDER — ACETAMINOPHEN 500 MG PO TABS: 1000.0000 mg | ORAL_TABLET | Freq: Four times a day (QID) | ORAL | Status: DC | PRN

## 2019-06-27 MED ORDER — PANTOPRAZOLE SODIUM 40 MG PO TBEC
40.0000 mg | DELAYED_RELEASE_TABLET | Freq: Every day | ORAL | Status: DC
  Administered 2019-06-27 – 2019-07-02 (×6): 40 mg via ORAL
  Filled 2019-06-27 (×6): qty 1

## 2019-06-27 MED ORDER — AMLODIPINE BESYLATE 5 MG PO TABS
5.0000 mg | ORAL_TABLET | Freq: Every day | ORAL | Status: DC
  Administered 2019-06-27 – 2019-07-02 (×6): 5 mg via ORAL
  Filled 2019-06-27 (×6): qty 1

## 2019-06-27 MED ORDER — RIVAROXABAN 20 MG PO TABS
20.0000 mg | ORAL_TABLET | Freq: Every day | ORAL | Status: DC
  Administered 2019-06-27: 20 mg via ORAL
  Filled 2019-06-27: qty 1

## 2019-06-27 MED ORDER — SODIUM CHLORIDE 0.9 % IV SOLN
125.0000 mg | Freq: Once | INTRAVENOUS | Status: AC
  Administered 2019-06-27: 125 mg via INTRAVENOUS
  Filled 2019-06-27: qty 10

## 2019-06-27 MED ORDER — INSULIN GLARGINE 100 UNIT/ML ~~LOC~~ SOLN
35.0000 [IU] | Freq: Every day | SUBCUTANEOUS | Status: DC
  Administered 2019-06-27 – 2019-07-02 (×7): 35 [IU] via SUBCUTANEOUS
  Filled 2019-06-27 (×6): qty 0.35

## 2019-06-27 MED ORDER — SODIUM CHLORIDE 0.9 % IV SOLN: 125.0000 mg | Freq: Once | INTRAVENOUS | Status: DC

## 2019-06-27 MED ORDER — INSULIN ASPART 100 UNIT/ML ~~LOC~~ SOLN
0.0000 [IU] | Freq: Three times a day (TID) | SUBCUTANEOUS | Status: DC
  Administered 2019-06-27 (×2): 3 [IU] via SUBCUTANEOUS
  Administered 2019-06-28: 2 [IU] via SUBCUTANEOUS
  Administered 2019-06-28: 3 [IU] via SUBCUTANEOUS
  Administered 2019-06-28: 2 [IU] via SUBCUTANEOUS
  Administered 2019-06-30: 3 [IU] via SUBCUTANEOUS
  Administered 2019-06-30: 2 [IU] via SUBCUTANEOUS
  Administered 2019-06-30: 3 [IU] via SUBCUTANEOUS

## 2019-06-27 NOTE — ED Notes (Addendum)
ED TO INPATIENT HANDOFF REPORT  ED Nurse Name and Phone #: Carlis Abbott [phone]  S Name/Age/Gender [name] [name] 63 y.o. male Room/Bed: 036C/036C  Code Status   Code Status: Full Code  Home/SNF/Other Home Patient oriented to: self, place, time and situation Is this baseline? Yes   Triage Complete: Triage complete  Chief Complaint SOB  Triage Note Reports SOB for a week and a half with intermittent chest pain feels as if something is sticking in his chest.  Denies any n/v but endorses some diarrhea that started today.   Allergies Allergies  Allergen Reactions  . Shrimp [Shellfish Allergy] Nausea And Vomiting  . Ace Inhibitors Cough  . Other Hives and Other (See Comments)    Patient reports developing hives after receiving "some antibiotic given in 1980''s at [facility]". He does not know which antibiotic.    Level of Care/Admitting Diagnosis ED Disposition    ED Disposition Condition [facility] Area: [place] [100100]  Level of Care: Telemetry Medical [104]  I expect the patient will be discharged within 24 hours: Yes  LOW acuity---Tx typically complete <24 hrs---ACUTE conditions typically can be evaluated <24 hours---LABS likely to return to acceptable levels <24 hours---IS near functional baseline---EXPECTED to return to current living arrangement---NOT newly hypoxic: Meets criteria for 5C-Observation unit  Covid Evaluation: Asymptomatic Screening Protocol (No Symptoms)  Diagnosis: Generalized weakness [id number]  Admitting Physician: Assunta Found [id number]  Attending Physician: Assunta Found [phone]  PT Class (Do Not Modify): Observation [104]  PT Acc Code (Do Not Modify): Observation [10022]       B Medical/Surgery History Past Medical History:  Diagnosis Date  . Anginal pain ([place])   . Anxiety   . Arrhythmia   . CAD (coronary artery disease), native coronary artery    a. Nonobstructive CAD by  cath 2013 - diffuse distal and branch vessel CAD, no severe disease in the major coronaries, LV mild global hypokinesis, EF 45%. b. ETT-Sestamibi 5/14: EF 31%, small fixed inferior defect with no ischemia.  . Cholecystitis   . Chronic CHF ([place])    a. Mixed ICM/NICM (?EtOH). EF 35% in 2008. Echo 5/13: EF 60-65%, mod LVH, EF 45% on V gram in 12/2011. EF 12/2012: EF 50-55%, mild LVH, inferobasal HK, mild MR. ETT-Ses 5/14 EF 41%. Cardiac MRI 5/14: EF 44%, mild global HK, subepicardial delayed enhancement in nonspecific RV insertion pattern.  . COLONIC POLYPS, HX OF 12/30/2006  . Gout   . H/O atrial flutter 2007   a. Ablations in 2007, 2008.  [name] Heart murmur   . HEPATITIS B, CHRONIC 12/30/2006  . History of alcohol abuse   . History of hiatal hernia   . History of medication noncompliance   . History of nuclear stress test    Myoview 3/17: + chest pain; EF 33%, downsloping ST depression 2, V4-6, LVH with strain, no ischemia on images; intermediate risk   . HYPERCHOLESTEROLEMIA 07/11/2010  . Hypertension   . Left sciatic nerve pain since 04/2015  . LIVER FUNCTION TESTS, ABNORMAL, HX OF 12/30/2006  . MITRAL REGURGITATION 12/30/2006  . 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 PAF (paroxysmal atrial fibrillation) (HCC)    On coumadin  . Rectal bleeding 12/18/2011   Scheduled for colonoscopy.    . Shortness of breath dyspnea    with excertion  . Sleep apnea    "suppose to send mask but they never did" (05/03/2015)  . Type II diabetes  mellitus [facility])    Past Surgical History:  Procedure Laterality Date  . A FLUTTER ABLATION  2007, 2008   catheter ablation   . CARDIAC CATHETERIZATION N/A 05/03/2015   Procedure: Right/Left Heart Cath and Coronary Angiography;  Surgeon: Larey Dresser, MD;  Location: [place] CV LAB;  Service: Cardiovascular;  Laterality: N/A;  . CARDIAC CATHETERIZATION N/A 05/03/2015   Procedure: Coronary Stent Intervention;  Surgeon: Jettie Booze, MD;  Location: [place] CV LAB;  Service: Cardiovascular;  Laterality: N/A;  . CARDIAC CATHETERIZATION N/A 11/04/2015   Procedure: Left Heart Cath and Coronary Angiography;  Surgeon: Larey Dresser, MD;  Location: [place] CV LAB;  Service: Cardiovascular;  Laterality: N/A;  . CARDIOVERSION N/A 08/24/2015   Procedure: CARDIOVERSION;  Surgeon: Thayer Headings, MD;  Location: [facility] ENDOSCOPY;  Service: Cardiovascular;  Laterality: N/A;  . CHOLECYSTECTOMY N/A 02/07/2016   Procedure: LAPAROSCOPIC CHOLECYSTECTOMY WITH  INTRAOPERATIVE CHOLANGIOGRAM;  Surgeon: Greer Pickerel, MD;  Location: [facility] ORS;  Service: General;  Laterality: N/A;  . COLONOSCOPY WITH PROPOFOL N/A 04/28/2019   Procedure: COLONOSCOPY WITH PROPOFOL;  Surgeon: Ronnette Juniper, MD;  Location: [place];  Service: Gastroenterology;  Laterality: N/A;  . CORONARY ANGIOPLASTY  12/05/01  . ESOPHAGOGASTRODUODENOSCOPY (EGD) WITH PROPOFOL N/A 04/27/2019   Procedure: ESOPHAGOGASTRODUODENOSCOPY (EGD) WITH PROPOFOL;  Surgeon: Ronnette Juniper, MD;  Location: [place];  Service: Gastroenterology;  Laterality: N/A;  . LEFT HEART CATH AND CORONARY ANGIOGRAPHY N/A 12/28/2016   Procedure: Left Heart Cath and Coronary Angiography;  Surgeon: Larey Dresser, MD;  Location: [place] CV LAB;  Service: Cardiovascular;  Laterality: N/A;  . LEFT HEART CATHETERIZATION WITH CORONARY ANGIOGRAM N/A 12/19/2011   Procedure: LEFT HEART CATHETERIZATION WITH CORONARY ANGIOGRAM;  Surgeon: Larey Dresser, MD;  Location: [facility] CATH LAB;  Service: Cardiovascular;  Laterality: N/A;  . OSTEOCHONDROMA EXCISION Left 1972   "took bone tumor off my shoulder"  . POLYPECTOMY  04/28/2019   Procedure: POLYPECTOMY;  Surgeon: Ronnette Juniper, MD;  Location[facility] ENDOSCOPY;  Service: Gastroenterology;;     A IV Location/Drains/Wounds Patient Lines/Drains/Airways Status   Active Line/Drains/Airways    Name:   Placement date:   Placement time:   Site:   Days:   Peripheral IV 06/26/19 Left Antecubital   06/26/19     1826    Antecubital   1   Peripheral IV 06/27/19 Left Antecubital   06/27/19    0150    Antecubital   less than 1   Open Drain 1 Right Abdomen   06/10/15    -    Abdomen   1478   Incision (Closed) 02/07/16 Abdomen   02/07/16    0849     1236   Incision - 4 Ports Abdomen Umbilicus Left;Lateral Left;Lateral Medial;Superior   02/07/16    -     1236          Intake/Output Last 24 hours  Intake/Output Summary (Last 24 hours) at 06/27/2019 1316 Last data filed at 06/26/2019 2134 Gross per 24 hour  Intake 1000 ml  Output -  Net 1000 ml    Labs/Imaging Results for orders placed or performed during the hospital encounter of 06/26/19 (from the past 48 hour(s))  Basic metabolic panel     Status: Abnormal   Collection Time: 06/26/19  3:50 PM  Result Value Ref Range   Sodium 138 135 - 145 mmol/L   Potassium 3.4 (L) 3.5 - 5.1 mmol/L   Chloride 104 98 - 111 mmol/L  CO2 21 (L) 22 - 32 mmol/L   Glucose, Bld 222 (H) 70 - 99 mg/dL   BUN 13 8 - 23 mg/dL   Creatinine, Ser 1.31 (H) 0.61 - 1.24 mg/dL   Calcium 9.0 8.9 - 10.3 mg/dL   GFR calc non Af Amer 57 (L) >60 mL/min   GFR calc Af Amer >60 >60 mL/min   Anion gap 13 5 - 15    Comment: Performed at [facility] [address], [place], [place] [address]  CBC     Status: Abnormal   Collection Time: 06/26/19  3:50 PM  Result Value Ref Range   WBC 6.7 4.0 - 10.5 K/uL   RBC 4.42 4.22 - 5.81 MIL/uL   Hemoglobin 9.4 (L) 13.0 - 17.0 g/dL   HCT 32.8 (L) 39.0 - 52.0 %   MCV 74.2 (L) 80.0 - 100.0 fL   MCH 21.3 (L) 26.0 - 34.0 pg   MCHC 28.7 (L) 30.0 - 36.0 g/dL   RDW 19.0 (H) 11.5 - 15.5 %   Platelets 316 150 - 400 K/uL   nRBC 0.0 0.0 - 0.2 %    Comment: Performed at [facility], [place] [address] [place], [place] [address]  Troponin I (High Sensitivity)     Status: None   Collection Time: 06/26/19  3:50 PM  Result Value Ref Range   Troponin I (High Sensitivity) 12 <18 ng/L    Comment: (NOTE) Elevated high sensitivity troponin  I (hsTnI) values and significant  changes across serial measurements may suggest ACS but many other  chronic and acute conditions are known to elevate hsTnI results.  Refer to the "Links" section for chest pain algorithms and additional  guidance. Performed at [facility], [place] [address] [place], [place] [address]   Troponin I (High Sensitivity)     Status: None   Collection Time: 06/26/19  5:45 PM  Result Value Ref Range   Troponin I (High Sensitivity) 12 <18 ng/L    Comment: (NOTE) Elevated high sensitivity troponin I (hsTnI) values and significant  changes across serial measurements may suggest ACS but many other  chronic and acute conditions are known to elevate hsTnI results.  Refer to the "Links" section for chest pain algorithms and additional  guidance. Performed at [facility], [place] [address] [place], [place] [address]   CK     Status: None   Collection Time: 06/26/19  6:24 PM  Result Value Ref Range   Total CK 80 49 - 397 U/L    Comment: Performed at [facility], [place] [address], [place], [place] [address]  TSH     Status: None   Collection Time: 06/26/19  7:14 PM  Result Value Ref Range   TSH 0.808 0.350 - 4.500 uIU/mL    Comment: Performed by a 3rd Generation assay with a functional sensitivity of <=0.01 uIU/mL. Performed at [facility], [place] [address], [place], [place] [address]   D-dimer, quantitative (not at [facility])     Status: None   Collection Time: 06/26/19 10:15 PM  Result Value Ref Range   D-Dimer, Quant <0.27 0.00 - 0.50 ug/mL-FEU    Comment: (NOTE) At the manufacturer cut-off of 0.50 ug/mL FEU, this assay has been documented to exclude PE with a sensitivity and negative predictive value of 97 to 99%.  At this time, this assay has not been approved by the FDA to exclude DVT/VTE. Results should be correlated with  clinical presentation. Performed at [facility], [place] [address], [place], [place] [address]   Brain  natriuretic peptide     Status: Abnormal   Collection Time: 06/26/19 10:15 PM  Result Value Ref Range   B Natriuretic Peptide 290.8 (H) 0.0 - 100.0 pg/mL    Comment: Performed at [facility] [address], [place], [place] [address]  SARS CORONAVIRUS 2 (TAT 6-24 HRS) Nasopharyngeal Nasopharyngeal Swab     Status: None   Collection Time: 06/26/19 11:28 PM   Specimen: Nasopharyngeal Swab  Result Value Ref Range   SARS Coronavirus 2 NEGATIVE NEGATIVE    Comment: (NOTE) SARS-CoV-2 target nucleic acids are NOT DETECTED. The SARS-CoV-2 RNA is generally detectable in upper and lower respiratory specimens during the acute phase of infection. Negative results do not preclude SARS-CoV-2 infection, do not rule out co-infections with other pathogens, and should not be used as the sole basis for treatment or other patient management decisions. Negative results must be combined with clinical observations, patient history, and epidemiological information. The expected result is Negative. Fact Sheet for Patients: SugarRoll.be Fact Sheet for Healthcare Providers: https://www.woods-mathews.com/ This test is not yet approved or cleared by the [place] FDA and  has been authorized for detection and/or diagnosis of SARS-CoV-2 by FDA under an Emergency Use Authorization (EUA). This EUA will remain  in effect (meaning this test can be used) for the duration of the COVID-19 declaration under Section 56 4(b)(1) of the Act, 21 U.S.C. section 360bbb-3(b)(1), unless the authorization is terminated or revoked sooner. Performed at [facility], [place] [address], [place], [place] [address]   Lactic acid, plasma     Status: None   Collection Time: 06/27/19 12:13 AM  Result Value Ref Range   Lactic Acid, Venous 1.1 0.5 - 1.9 mmol/L    Comment: Performed at [facility] [address] [place], [place] [address]  CBC WITH DIFFERENTIAL     Status:  Abnormal   Collection Time: 06/27/19 12:13 AM  Result Value Ref Range   WBC 7.6 4.0 - 10.5 K/uL   RBC 4.52 4.22 - 5.81 MIL/uL   Hemoglobin 9.7 (L) 13.0 - 17.0 g/dL   HCT 33.7 (L) 39.0 - 52.0 %   MCV 74.6 (L) 80.0 - 100.0 fL   MCH 21.5 (L) 26.0 - 34.0 pg   MCHC 28.8 (L) 30.0 - 36.0 g/dL   RDW 18.8 (H) 11.5 - 15.5 %   Platelets 300 150 - 400 K/uL   nRBC 0.0 0.0 - 0.2 %   Neutrophils Relative % 63 %   Neutro Abs 4.8 1.7 - 7.7 K/uL   Lymphocytes Relative 24 %   Lymphs Abs 1.8 0.7 - 4.0 K/uL   Monocytes Relative 10 %   Monocytes Absolute 0.7 0.1 - 1.0 K/uL   Eosinophils Relative 3 %   Eosinophils Absolute 0.2 0.0 - 0.5 K/uL   Basophils Relative 0 %   Basophils Absolute 0.0 0.0 - 0.1 K/uL   Immature Granulocytes 0 %   Abs Immature Granulocytes 0.03 0.00 - 0.07 K/uL    Comment: Performed at [facility], [address], [place], [place] [address]  Comprehensive metabolic panel     Status: Abnormal   Collection Time: 06/27/19 12:13 AM  Result Value Ref Range   Sodium 140 135 - 145 mmol/L   Potassium 3.6 3.5 - 5.1 mmol/L   Chloride 109 98 - 111 mmol/L   CO2 21 (L)  22 - 32 mmol/L   Glucose, Bld 93 70 - 99 mg/dL   BUN 13 8 - 23 mg/dL   Creatinine, Ser 1.08 0.61 - 1.24 mg/dL   Calcium 8.9 8.9 - 10.3 mg/dL   Total Protein 6.9 6.5 - 8.1 g/dL   Albumin 3.2 (L) 3.5 - 5.0 g/dL   AST 14 (L) 15 - 41 U/L   ALT 19 0 - 44 U/L   Alkaline Phosphatase 52 38 - 126 U/L   Total Bilirubin 1.0 0.3 - 1.2 mg/dL   GFR calc non Af Amer >60 >60 mL/min   GFR calc Af Amer >60 >60 mL/min   Anion gap 10 5 - 15    Comment: Performed at [facility] [address] [place], [place] [address]  D-dimer, quantitative     Status: None   Collection Time: 06/27/19 12:13 AM  Result Value Ref Range   D-Dimer, Quant 0.32 0.00 - 0.50 ug/mL-FEU    Comment: (NOTE) At the manufacturer cut-off of 0.50 ug/mL FEU, this assay has been documented to exclude PE with a sensitivity and negative predictive value  of 97 to 99%.  At this time, this assay has not been approved by the FDA to exclude DVT/VTE. Results should be correlated with clinical presentation. Performed at [facility], [place] [address], [place], [place] [address]   Procalcitonin     Status: None   Collection Time: 06/27/19 12:13 AM  Result Value Ref Range   Procalcitonin <0.10 ng/mL    Comment:        Interpretation: PCT (Procalcitonin) <= 0.5 ng/mL: Systemic infection (sepsis) is not likely. Local bacterial infection is possible. (NOTE)       Sepsis PCT Algorithm           Lower Respiratory Tract                                      Infection PCT Algorithm    ----------------------------     ----------------------------         PCT < 0.25 ng/mL                PCT < 0.10 ng/mL         Strongly encourage             Strongly discourage   discontinuation of antibiotics    initiation of antibiotics    ----------------------------     -----------------------------       PCT 0.25 - 0.50 ng/mL            PCT 0.10 - 0.25 ng/mL               OR       >80% decrease in PCT            Discourage initiation of                                            antibiotics      Encourage discontinuation           of antibiotics    ----------------------------     -----------------------------         PCT >= 0.50 ng/mL              PCT 0.26 -  0.50 ng/mL               AND        <80% decrease in PCT             Encourage initiation of                                             antibiotics       Encourage continuation           of antibiotics    ----------------------------     -----------------------------        PCT >= 0.50 ng/mL                  PCT > 0.50 ng/mL               AND         increase in PCT                  Strongly encourage                                      initiation of antibiotics    Strongly encourage escalation           of antibiotics                                     -----------------------------                                            PCT <= 0.25 ng/mL                                                 OR                                        > 80% decrease in PCT                                     Discontinue / Do not initiate                                             antibiotics Performed at [facility], [place] [address], [place], [place] [address]   Lactate dehydrogenase     Status: None   Collection Time: 06/27/19 12:13 AM  Result Value Ref Range   LDH 143 98 - 192 U/L    Comment: Performed at [facility], [place] [address], [place], [place] [address]  Ferritin     Status: Abnormal   Collection Time: 06/27/19 12:13 AM  Result Value Ref Range   Ferritin 18 (L)  24 - 336 ng/mL    Comment: Performed at [facility], [place] [address] [place], [place] [address]  Triglycerides     Status: None   Collection Time: 06/27/19 12:13 AM  Result Value Ref Range   Triglycerides 62 <150 mg/dL    Comment: Performed at [facility] [address], [place], [place] [address]  Fibrinogen     Status: Abnormal   Collection Time: 06/27/19 12:13 AM  Result Value Ref Range   Fibrinogen 600 (H) 210 - 475 mg/dL    Comment: Performed at [facility] [address], [place], [place] [address]  C-reactive protein     Status: Abnormal   Collection Time: 06/27/19 12:13 AM  Result Value Ref Range   CRP 1.0 (H) <1.0 mg/dL    Comment: Performed at [facility] [address], [place], [place] [address]  Magnesium     Status: None   Collection Time: 06/27/19  1:45 AM  Result Value Ref Range   Magnesium 1.9 1.7 - 2.4 mg/dL    Comment: Performed at [facility] [address] [place], [place] [address]  Lactic acid, plasma     Status: None   Collection Time: 06/27/19  2:13 AM  Result Value Ref Range   Lactic Acid, Venous 1.8 0.5 - 1.9 mmol/L    Comment: Performed at [facility] [address] [place], [place] [id number]  Basic metabolic panel      Status: Abnormal   Collection Time: 06/27/19  6:00 AM  Result Value Ref Range   Sodium 139 135 - 145 mmol/L   Potassium 3.7 3.5 - 5.1 mmol/L   Chloride 109 98 - 111 mmol/L   CO2 20 (L) 22 - 32 mmol/L   Glucose, Bld 118 (H) 70 - 99 mg/dL   BUN 11 8 - 23 mg/dL   Creatinine, Ser 0.97 0.61 - 1.24 mg/dL   Calcium 8.3 (L) 8.9 - 10.3 mg/dL   GFR calc non Af Amer >60 >60 mL/min   GFR calc Af Amer >60 >60 mL/min   Anion gap 10 5 - 15    Comment: Performed at [facility], [place] [address], [place], [place] [address]  CBC     Status: Abnormal   Collection Time: 06/27/19  6:00 AM  Result Value Ref Range   WBC 7.0 4.0 - 10.5 K/uL   RBC 3.99 (L) 4.22 - 5.81 MIL/uL   Hemoglobin 8.5 (L) 13.0 - 17.0 g/dL    Comment: Reticulocyte Hemoglobin testing may be clinically indicated, consider ordering this additional test [id number]    HCT 29.5 (L) 39.0 - 52.0 %   MCV 73.9 (L) 80.0 - 100.0 fL   MCH 21.3 (L) 26.0 - 34.0 pg   MCHC 28.8 (L) 30.0 - 36.0 g/dL   RDW 18.8 (H) 11.5 - 15.5 %   Platelets 284 150 - 400 K/uL   nRBC 0.0 0.0 - 0.2 %    Comment: Performed at [facility], [place] [address] [place], [place] [address]  Hemoglobin A1c     Status: Abnormal   Collection Time: 06/27/19  6:00 AM  Result Value Ref Range   Hgb A1c MFr Bld 6.6 (H) 4.8 - 5.6 %    Comment: (NOTE) Pre diabetes:          5.7%-6.4% Diabetes:              >6.4% Glycemic control for   <  7.0% adults with diabetes    Mean Plasma Glucose 142.72 mg/dL    Comment: Performed at [facility], [place] [address], [place], [place] [address]  CBG monitoring, ED     Status: Abnormal   Collection Time: 06/27/19  7:28 AM  Result Value Ref Range   Glucose-Capillary 113 (H) 70 - 99 mg/dL  CBG monitoring, ED     Status: Abnormal   Collection Time: 06/27/19 11:56 AM  Result Value Ref Range   Glucose-Capillary 155 (H) 70 - 99 mg/dL   Comment 1 Notify RN    Comment 2 Document in Chart    Dg Chest Portable 1 View  Result  Date: 06/26/2019 CLINICAL DATA:  Shortness of breath. Intermittent chest pain. EXAM: PORTABLE CHEST 1 VIEW COMPARISON:  Frontal and lateral views radiograph 04/24/2019 FINDINGS: Mild cardiomegaly, not significantly changed allowing for differences in technique. Unchanged mediastinal contours. No pulmonary edema, focal airspace disease, pleural effusion or pneumothorax. No acute osseous abnormalities are seen. IMPRESSION: Stable cardiomegaly. No acute abnormality. Electronically Signed   By: Keith Rake M.D.   On: 06/26/2019 18:38    Pending Labs Unresulted Labs (From admission, onward)    Start     Ordered   06/28/19 0500  CBC  Tomorrow morning,   R     06/27/19 1214   06/28/19 [id number]  Basic metabolic panel  Tomorrow morning,   R     06/27/19 1214   06/27/19 0013  Blood Culture (routine x 2)  BLOOD CULTURE X 2,   STAT     06/27/19 0014          Vitals/Pain Today's Vitals   06/27/19 0909 06/27/19 1218 06/27/19 1309 06/27/19 1314  BP: 110/68 (!) 147/96 111/62   Pulse: (!) 57 (!) 55    Resp: (!) 24 20 (!) 21   Temp:      TempSrc:      SpO2: 92% 100%    Weight:      Height:      PainSc:    0-No pain    Isolation Precautions Airborne and Contact precautions  Medications Medications  acetaminophen (TYLENOL) tablet 1,000 mg (has no administration in time range)  amLODipine (NORVASC) tablet 5 mg (5 mg Oral Given 06/27/19 0934)  atorvastatin (LIPITOR) tablet 80 mg (80 mg Oral Given 06/27/19 0935)  carvedilol (COREG) tablet 25 mg (25 mg Oral Given 06/27/19 0757)  furosemide (LASIX) tablet 20-40 mg (has no administration in time range)  hydrALAZINE (APRESOLINE) tablet 75 mg (0 mg Oral Hold 06/27/19 0933)  nitroGLYCERIN (NITROSTAT) SL tablet 0.4 mg (has no administration in time range)  sotalol (BETAPACE) tablet 120 mg (has no administration in time range)  pantoprazole (PROTONIX) EC tablet 40 mg (40 mg Oral Given 06/27/19 0934)  insulin aspart (novoLOG) injection 0-15 Units (3  Units Subcutaneous Given 06/27/19 1212)  insulin glargine (LANTUS) injection 35 Units (35 Units Subcutaneous Given 06/27/19 1040)  isosorbide mononitrate (IMDUR) 24 hr tablet 30 mg (30 mg Oral Given 06/27/19 1314)  ferric gluconate (NULECIT) 125 mg in sodium chloride 0.9 % 100 mL IVPB (125 mg Intravenous New Bag/Given 06/27/19 1313)  sodium chloride flush (NS) 0.9 % injection 3 mL (3 mLs Intravenous Given 06/26/19 1828)  lactated ringers bolus 1,000 mL (0 mLs Intravenous Stopped 06/26/19 2134)  potassium chloride SA (KLOR-CON) CR tablet 40 mEq (40 mEq Oral Given 06/27/19 0250)    Mobility walks Low fall risk   Focused Assessments Cardiac Assessment Handoff:  Cardiac Rhythm:  Normal sinus rhythm Lab Results  Component Value Date   CKTOTAL 80 06/26/2019   CKMB 2.5 12/19/2011   TROPONINI 0.03 (HH) 08/04/2016   Lab Results  Component Value Date   DDIMER 0.32 06/27/2019   Does the Patient currently have chest pain? No     R Recommendations: See Admitting Provider Note  Report given to: Juliann Pulse RN  Additional Notes: Patient with history of afib came in with complaint shortness of breath and chest pain. Patient in atrial flutter. Denies chest pain and shortness of breath.

## 2019-06-27 NOTE — Plan of Care (Signed)
Patient is awaiting anny test to be ordered.  Stated he feels good with just a small pain in his right chest.

## 2019-06-27 NOTE — Progress Notes (Signed)
Patient is concerned about not receiving his second dose of sotalol. He received his first dose late at 16:52pm. Next dose due at 22:00pm. Spoke with Rod Holler and Ronalee Belts (pharmacists at [facility]). Was told by Ronalee Belts that it would be too close to give evening dose. Was instructed to explain to patient reasoning why the evening will be missed then resumed as scheduled BID  at 10:00am.

## 2019-06-27 NOTE — Consult Note (Signed)
Cardiology Consultation:   Patient ID: [name] [name] MRN: [id number]; DOB: Mar 16, 1956  Admit date: 06/26/2019 Date of Consult: 06/27/2019  Primary Care Provider: [name], MD Primary Cardiologist: Loralie Champagne, MD  Primary Electrophysiologist:  None    Patient Profile:   [name] [name] is a 63 y.o. male with a hx of systolic heart failure, atrial flutter who is being seen today for the evaluation of systolic heart failure and atrial flutter at the request of Dr. Broadus John.  History of Present Illness:   Mr. Fails with history of chronic systolic heart failure EF by cardiac MRI in 20 1444% then with subsequent normalization of EF on echocardiogram on 04/25/2019 EF 60 to 65% here with continued episodes of exertional angina with minimal effort.  He is described this in the past.  Last heart catheterization was on 12/28/2016: It was felt at that time that there was no good target for intervention.  Left Main  Mild distal LM narrowing.  Left Anterior Descending  Far distal LAD with 90% stenosis. Diffuse 30-40% stenoses throughout the proximal and mid LAD.  Ramus Intermedius  Large vessel. Two serial 40% proximal ramus stenoses, two serial 50% mid ramus stenoses. Small to moderate 1st branch off ramus with 80% ostial stenosis. Small to moderate 2nd branch off ramus with 90% ostial stenosis.  Left Circumflex  Relatively small LCx with comparatively large ramus. 40% ostial LCx stenosis, 40% proximal LCx stenosis. Luminal irregularities throughout the rest of the vessel.  Right Coronary Artery  Patent proximal and distal RCA stents, distal stent with 30% in-stent restenosis. 60% mid RCA stenosis, mildly progressive compared to prior study.   For instance, when placing his trash can out on the street, he can no chest discomfort and diaphoresis.  Has had a history of noncompliance in the past.  His anemia has been worked up, hemoglobin was as low as 8.4 back in  September from 11.5.  He had upper and lower endoscopy.  He does have a capsule endoscopy which is pending next week.  Overall he seems to be fairly comfortable resting on his side in the ER bed.  No active complaints of chest discomfort.  His family member is in the room sitting with him.  Denies any fevers chills nausea vomiting syncope bleeding.  Heart Pathway Score:  HEAR Score: 5  Past Medical History:  Diagnosis Date  . Anginal pain ([place])   . Anxiety   . Arrhythmia   . CAD (coronary artery disease), native coronary artery    a. Nonobstructive CAD by cath 2013 - diffuse distal and branch vessel CAD, no severe disease in the major coronaries, LV mild global hypokinesis, EF 45%. b. ETT-Sestamibi 5/14: EF 31%, small fixed inferior defect with no ischemia.  . Cholecystitis   . Chronic CHF ([place])    a. Mixed ICM/NICM (?EtOH). EF 35% in 2008. Echo 5/13: EF 60-65%, mod LVH, EF 45% on V gram in 12/2011. EF 12/2012: EF 50-55%, mild LVH, inferobasal HK, mild MR. ETT-Ses 5/14 EF 41%. Cardiac MRI 5/14: EF 44%, mild global HK, subepicardial delayed enhancement in nonspecific RV insertion pattern.  . COLONIC POLYPS, HX OF 12/30/2006  . Gout   . H/O atrial flutter 2007   a. Ablations in 2007, 2008.  [name] Heart murmur   . HEPATITIS B, CHRONIC 12/30/2006  . History of alcohol abuse   . History of hiatal hernia   . History of medication noncompliance   . History of nuclear stress test  Myoview 3/17: + chest pain; EF 33%, downsloping ST depression 2, V4-6, LVH with strain, no ischemia on images; intermediate risk   . HYPERCHOLESTEROLEMIA 07/11/2010  . Hypertension   . Left sciatic nerve pain since 04/2015  . LIVER FUNCTION TESTS, ABNORMAL, HX OF 12/30/2006  . MITRAL REGURGITATION 12/30/2006  . 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 PAF (paroxysmal atrial fibrillation) ([facility])    On coumadin  . Rectal bleeding 12/18/2011   Scheduled for colonoscopy.    . Shortness of breath dyspnea    with  excertion  . Sleep apnea    "suppose to send mask but they never did" (05/03/2015)  . Type II diabetes mellitus ([place])     Past Surgical History:  Procedure Laterality Date  . A FLUTTER ABLATION  2007, 2008   catheter ablation   . CARDIAC CATHETERIZATION N/A 05/03/2015   Procedure: Right/Left Heart Cath and Coronary Angiography;  Surgeon: Larey Dresser, MD;  Location: [facility];  Service: Cardiovascular;  Laterality: N/A;  . CARDIAC CATHETERIZATION N/A 05/03/2015   Procedure: Coronary Stent Intervention;  Surgeon: Jettie Booze, MD;  Location: [place] [facility];  Service: Cardiovascular;  Laterality: N/A;  . CARDIAC CATHETERIZATION N/A 11/04/2015   Procedure: Left Heart Cath and Coronary Angiography;  Surgeon: Larey Dresser, MD;  Location: [place] [facility];  Service: Cardiovascular;  Laterality: N/A;  . CARDIOVERSION N/A 08/24/2015   Procedure: CARDIOVERSION;  Surgeon: Thayer Headings, MD;  Location: [facility];  Service: Cardiovascular;  Laterality: N/A;  . CHOLECYSTECTOMY N/A 02/07/2016   Procedure: LAPAROSCOPIC CHOLECYSTECTOMY WITH  INTRAOPERATIVE CHOLANGIOGRAM;  Surgeon: Greer Pickerel, MD;  Location: [facility];  Service: General;  Laterality: N/A;  . COLONOSCOPY WITH PROPOFOL N/A 04/28/2019   Procedure: COLONOSCOPY WITH PROPOFOL;  Surgeon: Ronnette Juniper, MD;  Location: [place];  Service: Gastroenterology;  Laterality: N/A;  . CORONARY ANGIOPLASTY  12/05/01  . ESOPHAGOGASTRODUODENOSCOPY (EGD) WITH PROPOFOL N/A 04/27/2019   Procedure: ESOPHAGOGASTRODUODENOSCOPY (EGD) WITH PROPOFOL;  Surgeon: Ronnette Juniper, MD;  Location: [place];  Service: Gastroenterology;  Laterality: N/A;  . LEFT HEART CATH AND CORONARY ANGIOGRAPHY N/A 12/28/2016   Procedure: Left Heart Cath and Coronary Angiography;  Surgeon: Larey Dresser, MD;  Location: [place] [facility];  Service: Cardiovascular;  Laterality: N/A;  . LEFT HEART CATHETERIZATION WITH CORONARY ANGIOGRAM N/A 12/19/2011   Procedure:  LEFT HEART CATHETERIZATION WITH CORONARY ANGIOGRAM;  Surgeon: Larey Dresser, MD;  [facility];  Service: Cardiovascular;  Laterality: N/A;  . OSTEOCHONDROMA EXCISION Left 1972   "took bone tumor off my shoulder"  . POLYPECTOMY  04/28/2019   Procedure: POLYPECTOMY;  Surgeon: Ronnette Juniper, MD;  Location: [name];  Service: Gastroenterology;;     Home Medications:  Prior to Admission medications   Medication Sig Start Date End Date Taking? Authorizing Provider  acetaminophen (TYLENOL) 500 MG tablet Take 1,000 mg by mouth every 6 (six) hours as needed (pain).   Yes [provider]  amLODipine (NORVASC) 5 MG tablet Take 1 tablet (5 mg total) by mouth daily. 09/29/18  Yes Charlott Rakes, MD  atorvastatin (LIPITOR) 80 MG tablet Take 1 tablet (80 mg total) by mouth daily. 09/29/18  Yes Charlott Rakes, MD  carvedilol (COREG) 25 MG tablet Take 1 tablet (25 mg total) by mouth 2 (two) times daily with a meal. 09/29/18  Yes Newlin, Enobong, MD  furosemide (LASIX) 20 MG tablet TAKE TWO TABLETS BY MOUTH IN THE MORNING, THEN TAKE ONE IN  THE EVENING Patient taking differently: Take 20-40 mg by mouth See admin instructions. Take 2 tablets (40 mg) by mouth every morning and 1 tablet (20 mg) in the evening 09/29/18  Yes Newlin, Enobong, MD  guaiFENesin (MUCINEX PO) Take 1 tablet by mouth 2 (two) times daily as needed (cough/congestion).   Yes [provider]  hydrALAZINE (APRESOLINE) 50 MG tablet Take 1.5 tablets (75 mg total) by mouth 2 (two) times daily. 04/28/19  Yes Swayze, Ava, DO  Insulin Glargine (LANTUS SOLOSTAR) 100 UNIT/ML Solostar Pen Inject 35 Units into the skin every morning. And pen needles 1/day Patient taking differently: Inject 35 Units into the skin daily after breakfast.  07/08/18  Yes Newlin, Charlane Ferretti, MD  isosorbide mononitrate (IMDUR) 30 MG 24 hr tablet Take 1 tablet (30 mg total) by mouth daily. 10/28/18  Yes Larey Dresser, MD  metFORMIN (GLUCOPHAGE) 1000 MG  tablet Take 1 tablet (1,000 mg total) by mouth 2 (two) times daily with a meal. 05/02/19  Yes [name], MD  nitroGLYCERIN (NITROSTAT) 0.4 MG SL tablet Place 1 tablet (0.4 mg total) under the tongue every 5 (five) minutes as needed for chest pain (x 3 tabs daily). Patient taking differently: Place 0.4 mg under the tongue every 5 (five) minutes x 3 doses as needed for chest pain.  09/29/18  Yes Charlott Rakes, MD  pantoprazole (PROTONIX) 40 MG tablet Take 1 tablet (40 mg total) by mouth daily. 04/28/19 04/27/20 Yes Swayze, Ava, DO  rivaroxaban (XARELTO) 20 MG TABS tablet Take 1 tablet (20 mg total) by mouth daily. 04/29/19  Yes Swayze, Ava, DO  sotalol (BETAPACE) 120 MG tablet TAKE 1 TABLET BY MOUTH EVERY 12 HOURS Patient taking differently: Take 120 mg by mouth every 12 (twelve) hours.  04/03/19  Yes Charlott Rakes, MD  ACCU-CHEK SOFTCLIX LANCETS lancets Use as instructed for three times daily testing of blood glucose 09/06/16   Jegede, Olugbemiga E, MD  Blood Glucose Monitoring Suppl (ACCU-CHEK AVIVA PLUS) w/Device KIT Use as directed for 3 times daily testing of blood glucose 09/06/16   Jegede, Olugbemiga E, MD  glucose blood (ACCU-CHEK AVIVA PLUS) test strip Use as instructed for 3 times daily testing of blood glucose 09/06/16   Tresa Garter, MD  Lancet Devices [facility]) lancets Use as instructed for 3 times daily testing of blood glucose 09/06/16   Tresa Garter, MD  Multiple Vitamin (MULTIVITAMIN WITH MINERALS) TABS tablet Take 1 tablet by mouth daily. Patient not taking: Reported on 06/26/2019 04/29/19   Swayze, Ava, DO  ondansetron (ZOFRAN-ODT) 4 MG disintegrating tablet Take 1 tablet (4 mg total) by mouth every 8 (eight) hours as needed for nausea or vomiting. Patient not taking: Reported on 06/26/2019 03/13/19   Zigmund Gottron, NP  TRUEPLUS LANCETS 28G MISC 1 each by Does not apply route 3 (three) times daily. 08/22/16   Tresa Garter, MD    Inpatient  Medications: Scheduled Meds: . amLODipine  5 mg Oral Daily  . atorvastatin  80 mg Oral Daily  . carvedilol  25 mg Oral BID WC  . furosemide  20-40 mg Oral See admin instructions  . hydrALAZINE  75 mg Oral BID  . insulin aspart  0-15 Units Subcutaneous TID WC  . insulin glargine  35 Units Subcutaneous QPC breakfast  . isosorbide mononitrate  30 mg Oral Daily  . pantoprazole  40 mg Oral Daily  . sotalol  120 mg Oral Q12H   Continuous Infusions: . ferric gluconate (FERRLECIT/NULECIT)  IV     PRN Meds: acetaminophen, nitroGLYCERIN  Allergies:    Allergies  Allergen Reactions  . Shrimp [Shellfish Allergy] Nausea And Vomiting  . Ace Inhibitors Cough  . Other Hives and Other (See Comments)    Patient reports developing hives after receiving "some antibiotic given in 1980''s at [facility]". He does not know which antibiotic.    Social History:   Social History   Socioeconomic History  . Marital status: Widowed    Spouse name: Not on file  . Number of children: 2  . Years of education: Not on file  . Highest education level: Not on file  Occupational History    Employer: UNEMPLOYED  Social Needs  . Financial resource strain: Not on file  . Food insecurity    Worry: Not on file    Inability: Not on file  . Transportation needs    Medical: Not on file    Non-medical: Not on file  Tobacco Use  . Smoking status: Former Smoker    Packs/day: 4.00    Years: 18.00    Pack years: 72.00    Types: Cigarettes    Quit date: 08/14/1983    Years since quitting: 35.8  . Smokeless tobacco: Never Used  Substance and Sexual Activity  . Alcohol use: Yes    Alcohol/week: 16.0 standard drinks    Types: 6 Cans of beer, 10 Shots of liquor per week    Comment: occasionally  . Drug use: Yes    Types: Marijuana    Comment: occasionally  . Sexual activity: Never  Lifestyle  . Physical activity    Days per week: Not on file    Minutes per session: Not on file  . Stress: Not on  file  Relationships  . Social Herbalist on phone: Not on file    Gets together: Not on file    Attends religious service: Not on file    Active member of club or organization: Not on file    Attends meetings of clubs or organizations: Not on file    Relationship status: Not on file  . Intimate partner violence    Fear of current or ex partner: Not on file    Emotionally abused: Not on file    Physically abused: Not on file    Forced sexual activity: Not on file  Other Topics Concern  . Not on file  Social History Narrative  . Not on file    Family History:    Family History  Problem Relation Age of Onset  . Diabetes Mother   . Hypertension Mother   . Heart attack Neg Hx   . Stroke Neg Hx      ROS:  Please see the history of present illness.  Chills nausea vomiting syncope bleeding. All other ROS reviewed and negative.     Physical Exam/Data:   Vitals:   06/27/19 0645 06/27/19 0800 06/27/19 0900 06/27/19 0909  BP: 124/66 (!) 154/78 110/68 110/68  Pulse: 63   (!) 57  Resp: 15 17  (!) 24  Temp:      TempSrc:      SpO2: 98%   92%  Weight:      Height:        Intake/Output Summary (Last 24 hours) at 06/27/2019 1212 Last data filed at 06/26/2019 2134 Gross per 24 hour  Intake 1000 ml  Output -  Net 1000 ml   Last 3 Weights 06/26/2019 05/27/2019 04/28/2019  Weight (lbs) 210 lb 201 lb 193 lb 12.8 oz  Weight (kg) 95.255 kg 91.173 kg 87.907 kg     Body mass index is 28.48 kg/m.  General:  Well nourished, well developed, in no acute distress HEENT: normal Lymph: no adenopathy Neck: no JVD Endocrine:  No thryomegaly Vascular: No carotid bruits; FA pulses 2+ bilaterally without bruits  Cardiac:  normal S1, S2; RRR; no murmur  Lungs:  clear to auscultation bilaterally, no wheezing, rhonchi or rales  Abd: soft, nontender, no hepatomegaly  Ext: no edema Musculoskeletal:  No deformities, BUE and BLE strength normal and equal Skin: warm and dry  Neuro:   CNs 2-12 intact, no focal abnormalities noted Psych:  Normal affect   EKG:  The EKG was personally reviewed and demonstrates: Atrial flutter with repolarization abnormalities noted.  Telemetry:  Telemetry was personally reviewed and demonstrates: Atrial flutter rate controlled  Relevant CV Studies: See catheterization report above.-2018 felt to be poor PCI targets.  Medical management.  Laboratory Data:  High Sensitivity Troponin:   Recent Labs  Lab 06/26/19 1550 06/26/19 1745  TROPONINIHS 12 12     Chemistry Recent Labs  Lab 06/26/19 1550 06/27/19 0013 06/27/19 0600  NA 138 140 139  K 3.4* 3.6 3.7  CL 104 109 109  CO2 21* 21* 20*  GLUCOSE 222* 93 118*  BUN '13 13 11  ' CREATININE 1.31* 1.08 0.97  CALCIUM 9.0 8.9 8.3*  GFRNONAA 57* >60 >60  GFRAA >60 >60 >60  ANIONGAP '13 10 10    ' Recent Labs  Lab 06/27/19 0013  PROT 6.9  ALBUMIN 3.2*  AST 14*  ALT 19  ALKPHOS 52  BILITOT 1.0   Hematology Recent Labs  Lab 06/26/19 1550 06/27/19 0013 06/27/19 0600  WBC 6.7 7.6 7.0  RBC 4.42 4.52 3.99*  HGB 9.4* 9.7* 8.5*  HCT 32.8* 33.7* 29.5*  MCV 74.2* 74.6* 73.9*  MCH 21.3* 21.5* 21.3*  MCHC 28.7* 28.8* 28.8*  RDW 19.0* 18.8* 18.8*  PLT 316 300 284   BNP Recent Labs  Lab 06/26/19 2215  BNP 290.8*    DDimer  Recent Labs  Lab 06/26/19 2215 06/27/19 0013  DDIMER <0.27 0.32     Radiology/Studies:  Dg Chest Portable 1 View  Result Date: 06/26/2019 CLINICAL DATA:  Shortness of breath. Intermittent chest pain. EXAM: PORTABLE CHEST 1 VIEW COMPARISON:  Frontal and lateral views radiograph 04/24/2019 FINDINGS: Mild cardiomegaly, not significantly changed allowing for differences in technique. Unchanged mediastinal contours. No pulmonary edema, focal airspace disease, pleural effusion or pneumothorax. No acute osseous abnormalities are seen. IMPRESSION: Stable cardiomegaly. No acute abnormality. Electronically Signed   By: Keith Rake M.D.   On: 06/26/2019  18:38    Assessment and Plan:   Coronary artery disease with ongoing anginal symptoms -It would not be unreasonable to proceed with cardiac catheterization to further evaluate any progression of his coronary anatomy in the past 2 years.  He seems to be failing medical therapy although noncompliance has been an issue in the past.  I am concerned however given his continued decrease in hemoglobin was recently 8.5 with MCV of 74.  I think we should postpone any invasive therapies at this time until capsule endoscopy is completed.  We will try to optimize medical management until then.  I recommend increasing his isosorbide to 60 mg from 30. -We will go ahead and recommend stopping/holding his Xarelto in anticipation of repeat diagnostic coronary catheterization. -I spoke with Dr. Broadus John, we will get GI  to see him here to see if we can potentially expedite capsule endoscopy to complete his work-up.  Acute on chronic diastolic heart failure -BNP mildly elevated 290.  Would not be unreasonable to give him an additional IV Lasix dose.  Seems fairly euvolemic in emergency department.  Atrial flutter -Under good rate control.  4-1.  No changes.  Continue with carvedilol.  Sotalol.       For questions or updates, please contact [place] Please consult [email or web address] for contact info under     Signed, Candee Furbish, MD  06/27/2019 12:12 PM

## 2019-06-27 NOTE — H&P (Signed)
History and Physical    [name] [name] ZOX:[id number] DOB: [birth year] DOA: 06/26/2019  PCP: [name], MD    Patient coming from: Home    Chief Complaint: Shortness of breath and general weakness  HPI: [name] [name] is a 63 y.o. male with medical history significant of flutter with variable block, history of ablation in 2007 2008, congestive heart failure systolic dysfunction, hypertension, hypercholesterolemia, chronic anemia came with a chief complaint of shortness of breath, generalized weakness.  ED Course:   ED note Patient presents today for "heart strain" that always occurs when he exerts himself by doing so much is taking out the trash.  The last episode of this was 2 days ago when he took out the trash, became so tired and tachypneic that he had to rest all of yesterday in bed.  He reports that he does not any nausea, emesis, no associated symptoms.  He is unable to describe the heart strain feeling beyond that it is not sharp, does not radiate, and endorses that it feels as if something is sticking in his chest.  He has not tried anything for symptoms, nothing seems to make his symptoms better, exertion makes his symptoms worse. Labs showed a stable hemoglobin the previous at 9.4, EKG showed atrial flutter 4-1 rate controlled.  Patient is symptomatically still not at his baseline.  Delta troponin negative.  D-dimer was negative.  Cardiology consulted, and they feel that this is likely not Cardiologic in nature but considering atrial flutter in symptomology, they will see patient in the morning.   Review of Systems: As per HPI otherwise 10 point review of systems negative.  Except shortness of breath and general weakness  Past Medical History:  Diagnosis Date  . Anginal pain ([place])   . Anxiety   . Arrhythmia   . CAD (coronary artery disease), native coronary artery    a. Nonobstructive CAD by cath 2013 - diffuse distal and branch vessel CAD, no severe  disease in the major coronaries, LV mild global hypokinesis, EF 45%. b. ETT-Sestamibi 5/14: EF 31%, small fixed inferior defect with no ischemia.  . Cholecystitis   . Chronic CHF ([place])    a. Mixed ICM/NICM (?EtOH). EF 35% in 2008. Echo 5/13: EF 60-65%, mod LVH, EF 45% on V gram in 12/2011. EF 12/2012: EF 50-55%, mild LVH, inferobasal HK, mild MR. ETT-Ses 5/14 EF 41%. Cardiac MRI 5/14: EF 44%, mild global HK, subepicardial delayed enhancement in nonspecific RV insertion pattern.  . COLONIC POLYPS, HX OF 12/30/2006  . Gout   . H/O atrial flutter 2007   a. Ablations in 2007, 2008.  [name] Heart murmur   . HEPATITIS B, CHRONIC 12/30/2006  . History of alcohol abuse   . History of hiatal hernia   . History of medication noncompliance   . History of nuclear stress test    Myoview 3/17: + chest pain; EF 33%, downsloping ST depression 2, V4-6, LVH with strain, no ischemia on images; intermediate risk   . HYPERCHOLESTEROLEMIA 07/11/2010  . Hypertension   . Left sciatic nerve pain since 04/2015  . LIVER FUNCTION TESTS, ABNORMAL, HX OF 12/30/2006  . MITRAL REGURGITATION 12/30/2006  . 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 PAF (paroxysmal atrial fibrillation) (HCC)    On coumadin  . Rectal bleeding 12/18/2011   Scheduled for colonoscopy.    . Shortness of breath dyspnea    with excertion  . Sleep apnea    "suppose to send mask but  they never did" (05/03/2015)  . Type II diabetes mellitus ([place])     Past Surgical History:  Procedure Laterality Date  . A FLUTTER ABLATION  2007, 2008   catheter ablation   . CARDIAC CATHETERIZATION N/A 05/03/2015   Procedure: Right/Left Heart Cath and Coronary Angiography;  Surgeon: Larey Dresser, MD;  Location: [place] [facility];  Service: Cardiovascular;  Laterality: N/A;  . CARDIAC CATHETERIZATION N/A 05/03/2015   Procedure: Coronary Stent Intervention;  Surgeon: Jettie Booze, MD;  Location: [place] [facility];  Service: Cardiovascular;  Laterality:  N/A;  . CARDIAC CATHETERIZATION N/A 11/04/2015   Procedure: Left Heart Cath and Coronary Angiography;  Surgeon: Larey Dresser, MD;  Location: [place] [facility];  Service: Cardiovascular;  Laterality: N/A;  . CARDIOVERSION N/A 08/24/2015   Procedure: CARDIOVERSION;  Surgeon: Thayer Headings, MD;  Location: [facility] ENDOS[facility];  Service: Cardiovascular;  Laterality: N/A;  . CHOLECYSTECTOMY N/A 02/07/2016   Procedure: LAPAROSCOPIC CHOLECYSTECTOMY WITH  INTRAOPERATIVE CHOLANGIOGRAM;  Surgeon: Greer Pickerel, MD;  Location: [facility];  Service: General;  Laterality: N/A;  . COLONOSCOPY WITH PROPOFOL N/A 04/28/2019   Procedure: COLONOSCOPY WITH PROPOFOL;  Surgeon: Ronnette Juniper, MD;  Location: [place];  Service: Gastroenterology;  Laterality: N/A;  . CORONARY ANGIOPLASTY  12/05/01  . ESOPHAGOGASTRODUODENOSCOPY (EGD) WITH PROPOFOL N/A 04/27/2019   Procedure: ESOPHAGOGASTRODUODENOSCOPY (EGD) WITH PROPOFOL;  Surgeon: Ronnette Juniper, MD;  Location: [place];  Service: Gastroenterology;  Laterality: N/A;  . LEFT HEART CATH AND CORONARY ANGIOGRAPHY N/A 12/28/2016   Procedure: Left Heart Cath and Coronary Angiography;  Surgeon: Larey Dresser, MD;  Location: [place] [facility];  Service: Cardiovascular;  Laterality: N/A;  . LEFT HEART CATHETERIZATION WITH CORONARY ANGIOGRAM N/A 12/19/2011   Procedure: LEFT HEART CATHETERIZATION WITH CORONARY ANGIOGRAM;  Surgeon: Larey Dresser, MD;  Location: [facility] CATH LAB;  Service: Cardiovascular;  Laterality: N/A;  . OSTEOCHONDROMA EXCISION Left 1972   "took bone tumor off my shoulder"  . POLYPECTOMY  04/28/2019   Procedure: POLYPECTOMY;  Surgeon: Ronnette Juniper, MD;  Location: [place];  Service: Gastroenterology;;     reports that he quit smoking about 35 years ago. His smoking use included cigarettes. He has a 72.00 pack-year smoking history. He has never used smokeless tobacco. He reports current alcohol use of about 16.0 standard drinks of alcohol per week. He reports current  drug use. Drug: Marijuana.  Allergies  Allergen Reactions  . Shrimp [Shellfish Allergy] Nausea And Vomiting  . Ace Inhibitors Cough  . Other Hives and Other (See Comments)    Patient reports developing hives after receiving "some antibiotic given in 1980''s at [facility]". He does not know which antibiotic.    Family History  Problem Relation Age of Onset  . Diabetes Mother   . Hypertension Mother   . Heart attack Neg Hx   . Stroke Neg Hx      Prior to Admission medications   Medication Sig Start Date End Date Taking? Authorizing Provider  acetaminophen (TYLENOL) 500 MG tablet Take 1,000 mg by mouth every 6 (six) hours as needed (pain).   Yes [provider]  amLODipine (NORVASC) 5 MG tablet Take 1 tablet (5 mg total) by mouth daily. 09/29/18  Yes Charlott Rakes, MD  atorvastatin (LIPITOR) 80 MG tablet Take 1 tablet (80 mg total) by mouth daily. 09/29/18  Yes Charlott Rakes, MD  carvedilol (COREG) 25 MG tablet Take 1 tablet (25 mg total) by mouth 2 (two) times daily with a  meal. 09/29/18  Yes Newlin, Enobong, MD  furosemide (LASIX) 20 MG tablet TAKE TWO TABLETS BY MOUTH IN THE MORNING, THEN TAKE ONE IN THE EVENING Patient taking differently: Take 20-40 mg by mouth See admin instructions. Take 2 tablets (40 mg) by mouth every morning and 1 tablet (20 mg) in the evening 09/29/18  Yes Newlin, Enobong, MD  guaiFENesin (MUCINEX PO) Take 1 tablet by mouth 2 (two) times daily as needed (cough/congestion).   Yes [provider]  hydrALAZINE (APRESOLINE) 50 MG tablet Take 1.5 tablets (75 mg total) by mouth 2 (two) times daily. 04/28/19  Yes Swayze, Ava, DO  Insulin Glargine (LANTUS SOLOSTAR) 100 UNIT/ML Solostar Pen Inject 35 Units into the skin every morning. And pen needles 1/day Patient taking differently: Inject 35 Units into the skin daily after breakfast.  07/08/18  Yes Newlin, Charlane Ferretti, MD  isosorbide mononitrate (IMDUR) 30 MG 24 hr tablet Take 1 tablet (30 mg  total) by mouth daily. 10/28/18  Yes Larey Dresser, MD  metFORMIN (GLUCOPHAGE) 1000 MG tablet Take 1 tablet (1,000 mg total) by mouth 2 (two) times daily with a meal. 05/02/19  Yes [name], MD  nitroGLYCERIN (NITROSTAT) 0.4 MG SL tablet Place 1 tablet (0.4 mg total) under the tongue every 5 (five) minutes as needed for chest pain (x 3 tabs daily). Patient taking differently: Place 0.4 mg under the tongue every 5 (five) minutes x 3 doses as needed for chest pain.  09/29/18  Yes Charlott Rakes, MD  pantoprazole (PROTONIX) 40 MG tablet Take 1 tablet (40 mg total) by mouth daily. 04/28/19 04/27/20 Yes Swayze, Ava, DO  rivaroxaban (XARELTO) 20 MG TABS tablet Take 1 tablet (20 mg total) by mouth daily. 04/29/19  Yes Swayze, Ava, DO  sotalol (BETAPACE) 120 MG tablet TAKE 1 TABLET BY MOUTH EVERY 12 HOURS Patient taking differently: Take 120 mg by mouth every 12 (twelve) hours.  04/03/19  Yes Charlott Rakes, MD  ACCU-CHEK SOFTCLIX LANCETS lancets Use as instructed for three times daily testing of blood glucose 09/06/16   Jegede, Olugbemiga E, MD  Blood Glucose Monitoring Suppl (ACCU-CHEK AVIVA PLUS) w/Device KIT Use as directed for 3 times daily testing of blood glucose 09/06/16   Jegede, Olugbemiga E, MD  glucose blood (ACCU-CHEK AVIVA PLUS) test strip Use as instructed for 3 times daily testing of blood glucose 09/06/16   Tresa Garter, MD  Lancet Devices [facility]) lancets Use as instructed for 3 times daily testing of blood glucose 09/06/16   Tresa Garter, MD  Multiple Vitamin (MULTIVITAMIN WITH MINERALS) TABS tablet Take 1 tablet by mouth daily. Patient not taking: Reported on 06/26/2019 04/29/19   Swayze, Ava, DO  ondansetron (ZOFRAN-ODT) 4 MG disintegrating tablet Take 1 tablet (4 mg total) by mouth every 8 (eight) hours as needed for nausea or vomiting. Patient not taking: Reported on 06/26/2019 03/13/19   Zigmund Gottron, NP  TRUEPLUS LANCETS 28G MISC 1 each by Does not  apply route 3 (three) times daily. 08/22/16   Tresa Garter, MD    Physical Exam: Vitals:   06/26/19 2215 06/27/19 0254 06/27/19 0300 06/27/19 0315  BP: (!) 143/82 (!) 123/58 127/81 (!) 158/69  Pulse: 62  61 62  Resp: '19  16 17  ' Temp:      TempSrc:      SpO2: 98%  96% 100%  Weight:      Height:        Constitutional: NAD, calm, comfortable Vitals:  06/26/19 2215 06/27/19 0254 06/27/19 0300 06/27/19 0315  BP: (!) 143/82 (!) 123/58 127/81 (!) 158/69  Pulse: 62  61 62  Resp: '19  16 17  ' Temp:      TempSrc:      SpO2: 98%  96% 100%  Weight:      Height:       Eyes: PERRL, lids and conjunctivae normal ENMT: Mucous membranes are moist. Posterior pharynx clear of any exudate or lesions.Normal dentition.  Neck: normal, supple, no masses, no thyromegaly Respiratory: clear to auscultation bilaterally, no wheezing, no crackles. Normal respiratory effort. No accessory muscle use.  Cardiovascular: Regular rate and rhythm, no murmurs / rubs / gallops. No extremity edema. 2+ pedal pulses. No carotid bruits.  Abdomen: no tenderness, no masses palpated. No hepatosplenomegaly. Bowel sounds positive.  Musculoskeletal: no clubbing / cyanosis. No joint deformity upper and lower extremities. Good ROM, no contractures. Normal muscle tone.  Skin: no rashes, lesions, ulcers. No induration Neurologic: CN 2-12 grossly intact. Sensation intact, DTR normal. Strength 5/5 in all 4.  Psychiatric: Normal judgment and insight. Alert and oriented x 3. Normal mood.    Labs on Admission: I have personally reviewed following labs and imaging studies  CBC: Recent Labs  Lab 06/26/19 1550 06/27/19 0013  WBC 6.7 7.6  NEUTROABS  --  4.8  HGB 9.4* 9.7*  HCT 32.8* 33.7*  MCV 74.2* 74.6*  PLT 316 458   Basic Metabolic Panel: Recent Labs  Lab 06/26/19 1550 06/27/19 0013 06/27/19 0145  NA 138 140  --   K 3.4* 3.6  --   CL 104 109  --   CO2 21* 21*  --   GLUCOSE 222* 93  --   BUN 13 13  --    CREATININE 1.31* 1.08  --   CALCIUM 9.0 8.9  --   MG  --   --  1.9   GFR: Estimated Creatinine Clearance: 81.7 mL/min (by C-G formula based on SCr of 1.08 mg/dL). Liver Function Tests: Recent Labs  Lab 06/27/19 0013  AST 14*  ALT 19  ALKPHOS 52  BILITOT 1.0  PROT 6.9  ALBUMIN 3.2*   No results for input(s): LIPASE, AMYLASE in the last 168 hours. No results for input(s): AMMONIA in the last 168 hours. Coagulation Profile: No results for input(s): INR, PROTIME in the last 168 hours. Cardiac Enzymes: Recent Labs  Lab 06/26/19 1824  CKTOTAL 80   BNP (last 3 results) No results for input(s): PROBNP in the last 8760 hours. HbA1C: No results for input(s): HGBA1C in the last 72 hours. CBG: No results for input(s): GLUCAP in the last 168 hours. Lipid Profile: Recent Labs    06/27/19 0013  TRIG 62   Thyroid Function Tests: Recent Labs    06/26/19 1914  TSH 0.808   Anemia Panel: Recent Labs    06/27/19 0013  FERRITIN 18*   Urine analysis:    Component Value Date/Time   COLORURINE YELLOW 03/23/2019 [place] 03/23/2019 1343   LABSPEC 1.025 03/23/2019 1343   PHURINE 5.5 03/23/2019 1343   GLUCOSEU NEGATIVE 03/23/2019 1343   HGBUR NEGATIVE 03/23/2019 1343   HGBUR moderate 09/27/2010 1646   BILIRUBINUR NEGATIVE 03/23/2019 1343   BILIRUBINUR small (A) 03/13/2019 1620   BILIRUBINUR neg 05/18/2014 1239   KETONESUR NEGATIVE 03/23/2019 1343   PROTEINUR >=300 (A) 03/13/2019 1620   PROTEINUR 30 05/18/2014 1239   PROTEINUR NEGATIVE 11/08/2013 1042   UROBILINOGEN 0.2 03/23/2019 1343   NITRITE  NEGATIVE 03/23/2019 1343   LEUKOCYTESUR NEGATIVE 03/23/2019 1343    Radiological Exams on Admission: Dg Chest Portable 1 View  Result Date: 06/26/2019 CLINICAL DATA:  Shortness of breath. Intermittent chest pain. EXAM: PORTABLE CHEST 1 VIEW COMPARISON:  Frontal and lateral views radiograph 04/24/2019 FINDINGS: Mild cardiomegaly, not significantly changed  allowing for differences in technique. Unchanged mediastinal contours. No pulmonary edema, focal airspace disease, pleural effusion or pneumothorax. No acute osseous abnormalities are seen. IMPRESSION: Stable cardiomegaly. No acute abnormality. Electronically Signed   By: Keith Rake M.D.   On: 06/26/2019 18:38    EKG: Independently reviewed.  Flutter with variable block ST-T changes consider ischemia  Assessment/Plan Active Problems:   HYPERCHOLESTEROLEMIA   GOUT   NICM (nonischemic cardiomyopathy) (HCC)   Obstructive sleep apnea   Essential hypertension   Chronic anticoagulation   Atrial flutter with controlled response (HCC)   Acute on chronic congestive heart failure with left ventricular diastolic dysfunction (HCC)   CKD (chronic kidney disease), stage III   Type 2 diabetes mellitus without complication (HCC)   Iron deficiency anemia due to chronic blood loss   Generalized weakness   Assessment plan Acute on chronic congestive heart failure with left ventricular diastolic dysfunction Patient complaining of shortness of breath and general weakness Can be secondary to chronic anemia and flutter with variable rate Will be evaluated by cardiology again in the morning, echocardiogram BNP 290 Resume Coreg, Lasix nitro  Active flutter variable block Coreg and sotalol and Xarelto  Nonischemic cardiomyopathy Resume Coreg Lasix nitro  Essential hypertension Resume Norvasc, hydralazine  Hypercholesterolemia Lipitor  Chronic kidney disease stage II Avoid nephrotoxins follow BUN and creatinine   Diabetes mellitus type 2 complicated with chronic kidney disease Lantus insulin sliding scale per sensitivity factor   Iron deficiency anemia secondary to chronic blood loss Hemoglobin stable at 9.7 Patient is getting iron IV Patient has work-up for GI bleed and did not find a source of bleeding   Obstructive sleep apnea CPAP  DVT prophylaxis Code Status:   Family  Communication:   Disposition Plan:  Consults called:   Admission status:     Linard Millers Novali Vollman MD [facility]  If 7PM-7AM, please contact night-coverage [email or web address]   06/27/2019, 5:16 AM

## 2019-06-27 NOTE — ED Notes (Signed)
Breakfast ordered for patient  

## 2019-06-27 NOTE — ED Notes (Signed)
Pt CBG was 155. Notified Carlis Abbott, RN

## 2019-06-27 NOTE — Progress Notes (Signed)
Patient seen and examined, admitted earlier this morning by Dr.Critescu at 5am  -Please see his H&P for details, briefly this is a 63 year old male with history of diffuse CAD, atrial flutter on Xarelto, chronic diastolic CHF, presented to [facility] emergency room with exertional chest pain in September at that time he was noted to have drop in his hemoglobin from baseline 11 to 7-8 range, he had an EGD and colonoscopy at that time which was unremarkable, followed by Dr. Therisa Doyne with [facility], plan for capsule endoscopy next week and IV iron on Tuesday -Presented to the emergency room again with exertional chest pain  1.  Exertional chest pain, diffuse CAD -last cath 12/2016, noted far distal LAD with 90% stenosis, patent proximal and distal RCA stents -Appreciate cardiology input, situation complicated by iron deficiency anemia in the setting of Xarelto use, recent GI work-up was unremarkable, remains on Xarelto-now on hold for possible diagnostic cath -Increase Imdur dose  2.  Iron deficiency anemia -Subacute and progressive for 2 to 3 months now, hospitalized in September for same, -EGD and colonoscopy were unremarkable, capsule endoscopy scheduled for next week by Dr. Therisa Doyne, will notify [facility] to see if this can be expedited -Give IV iron x1 now, monitor hemoglobin transfuse if less than 8  3.  Chronic diastolic CHF -Appears close to euvolemic, continue Lasix  4.  Atrial flutter -Continue carvedilol and sotalol -Holding Xarelto in anticipation of possible left heart cath  5. type 2 diabetes mellitus -CBGs are stable, hemoglobin A1c was 6.8 in April suggesting good control -Continue Lantus and sliding scale, recheck A1c  Domenic Polite, MD

## 2019-06-28 DIAGNOSIS — M109 Gout, unspecified: Secondary | ICD-10-CM | POA: Diagnosis present

## 2019-06-28 DIAGNOSIS — G4730 Sleep apnea, unspecified: Secondary | ICD-10-CM | POA: Diagnosis present

## 2019-06-28 DIAGNOSIS — Y831 Surgical operation with implant of artificial internal device as the cause of abnormal reaction of the patient, or of later complication, without mention of misadventure at the time of the procedure: Secondary | ICD-10-CM | POA: Diagnosis present

## 2019-06-28 DIAGNOSIS — Z20828 Contact with and (suspected) exposure to other viral communicable diseases: Secondary | ICD-10-CM | POA: Diagnosis present

## 2019-06-28 DIAGNOSIS — F419 Anxiety disorder, unspecified: Secondary | ICD-10-CM | POA: Diagnosis present

## 2019-06-28 DIAGNOSIS — Z794 Long term (current) use of insulin: Secondary | ICD-10-CM | POA: Diagnosis not present

## 2019-06-28 DIAGNOSIS — I4892 Unspecified atrial flutter: Secondary | ICD-10-CM | POA: Diagnosis present

## 2019-06-28 DIAGNOSIS — E7800 Pure hypercholesterolemia, unspecified: Secondary | ICD-10-CM | POA: Diagnosis present

## 2019-06-28 DIAGNOSIS — G4733 Obstructive sleep apnea (adult) (pediatric): Secondary | ICD-10-CM | POA: Diagnosis present

## 2019-06-28 DIAGNOSIS — I509 Heart failure, unspecified: Secondary | ICD-10-CM | POA: Diagnosis not present

## 2019-06-28 DIAGNOSIS — I2510 Atherosclerotic heart disease of native coronary artery without angina pectoris: Secondary | ICD-10-CM | POA: Diagnosis present

## 2019-06-28 DIAGNOSIS — I340 Nonrheumatic mitral (valve) insufficiency: Secondary | ICD-10-CM | POA: Diagnosis present

## 2019-06-28 DIAGNOSIS — E1122 Type 2 diabetes mellitus with diabetic chronic kidney disease: Secondary | ICD-10-CM | POA: Diagnosis present

## 2019-06-28 DIAGNOSIS — Z7901 Long term (current) use of anticoagulants: Secondary | ICD-10-CM | POA: Diagnosis not present

## 2019-06-28 DIAGNOSIS — Z79899 Other long term (current) drug therapy: Secondary | ICD-10-CM | POA: Diagnosis not present

## 2019-06-28 DIAGNOSIS — D500 Iron deficiency anemia secondary to blood loss (chronic): Secondary | ICD-10-CM | POA: Diagnosis present

## 2019-06-28 DIAGNOSIS — N183 Chronic kidney disease, stage 3 unspecified: Secondary | ICD-10-CM | POA: Diagnosis present

## 2019-06-28 DIAGNOSIS — B181 Chronic viral hepatitis B without delta-agent: Secondary | ICD-10-CM | POA: Diagnosis present

## 2019-06-28 DIAGNOSIS — I428 Other cardiomyopathies: Secondary | ICD-10-CM | POA: Diagnosis present

## 2019-06-28 DIAGNOSIS — R0602 Shortness of breath: Secondary | ICD-10-CM | POA: Diagnosis present

## 2019-06-28 DIAGNOSIS — T82855A Stenosis of coronary artery stent, initial encounter: Secondary | ICD-10-CM | POA: Diagnosis present

## 2019-06-28 DIAGNOSIS — I130 Hypertensive heart and chronic kidney disease with heart failure and stage 1 through stage 4 chronic kidney disease, or unspecified chronic kidney disease: Secondary | ICD-10-CM | POA: Diagnosis present

## 2019-06-28 DIAGNOSIS — I480 Paroxysmal atrial fibrillation: Secondary | ICD-10-CM | POA: Diagnosis present

## 2019-06-28 DIAGNOSIS — I10 Essential (primary) hypertension: Secondary | ICD-10-CM | POA: Diagnosis not present

## 2019-06-28 DIAGNOSIS — Z560 Unemployment, unspecified: Secondary | ICD-10-CM | POA: Diagnosis not present

## 2019-06-28 DIAGNOSIS — K5730 Diverticulosis of large intestine without perforation or abscess without bleeding: Secondary | ICD-10-CM | POA: Diagnosis present

## 2019-06-28 DIAGNOSIS — N179 Acute kidney failure, unspecified: Secondary | ICD-10-CM | POA: Diagnosis not present

## 2019-06-28 DIAGNOSIS — I5033 Acute on chronic diastolic (congestive) heart failure: Secondary | ICD-10-CM | POA: Diagnosis present

## 2019-06-28 DIAGNOSIS — K649 Unspecified hemorrhoids: Secondary | ICD-10-CM | POA: Diagnosis present

## 2019-06-28 LAB — GLUCOSE, CAPILLARY
Glucose-Capillary: 174 mg/dL — ABNORMAL HIGH (ref 70–99)
Glucose-Capillary: 128 mg/dL — ABNORMAL HIGH (ref 70–99)
Glucose-Capillary: 142 mg/dL — ABNORMAL HIGH (ref 70–99)
Glucose-Capillary: 157 mg/dL — ABNORMAL HIGH (ref 70–99)

## 2019-06-28 LAB — CBC
HCT: 30.6 % — ABNORMAL LOW (ref 39.0–52.0)
Hemoglobin: 8.9 g/dL — ABNORMAL LOW (ref 13.0–17.0)
MCH: 21.4 pg — ABNORMAL LOW (ref 26.0–34.0)
MCHC: 29.1 g/dL — ABNORMAL LOW (ref 30.0–36.0)
MCV: 73.7 fL — ABNORMAL LOW (ref 80.0–100.0)
Platelets: 324 10*3/uL (ref 150–400)
RBC: 4.15 MIL/uL — ABNORMAL LOW (ref 4.22–5.81)
RDW: 18.7 % — ABNORMAL HIGH (ref 11.5–15.5)
WBC: 7.9 10*3/uL (ref 4.0–10.5)
nRBC: 0 % (ref 0.0–0.2)

## 2019-06-28 LAB — BASIC METABOLIC PANEL
Anion gap: 9 (ref 5–15)
BUN: 14 mg/dL (ref 8–23)
CO2: 20 mmol/L — ABNORMAL LOW (ref 22–32)
Calcium: 8.7 mg/dL — ABNORMAL LOW (ref 8.9–10.3)
Chloride: 112 mmol/L — ABNORMAL HIGH (ref 98–111)
Creatinine, Ser: 1.15 mg/dL (ref 0.61–1.24)
GFR calc Af Amer: >60 mL/min (ref >60)
GFR calc non Af Amer: >60 mL/min (ref >60)
Glucose, Bld: 114 mg/dL — ABNORMAL HIGH (ref 70–99)
Potassium: 3.8 mmol/L (ref 3.5–5.1)
Sodium: 141 mmol/L (ref 135–145)

## 2019-06-28 MED ORDER — FUROSEMIDE 10 MG/ML IJ SOLN
40.0000 mg | Freq: Every day | INTRAMUSCULAR | Status: DC
  Administered 2019-06-28: 40 mg via INTRAVENOUS
  Filled 2019-06-28: qty 4

## 2019-06-28 MED ORDER — ISOSORBIDE MONONITRATE ER 30 MG PO TB24
30.0000 mg | ORAL_TABLET | Freq: Once | ORAL | Status: AC
  Administered 2019-06-28: 30 mg via ORAL
  Filled 2019-06-28: qty 1

## 2019-06-28 MED ORDER — ENOXAPARIN SODIUM 40 MG/0.4ML ~~LOC~~ SOLN
40.0000 mg | SUBCUTANEOUS | Status: DC
  Administered 2019-06-28 – 2019-06-30 (×3): 40 mg via SUBCUTANEOUS
  Filled 2019-06-28 (×3): qty 0.4

## 2019-06-28 MED ORDER — ISOSORBIDE MONONITRATE ER 60 MG PO TB24
60.0000 mg | ORAL_TABLET | Freq: Every day | ORAL | Status: DC
  Administered 2019-06-28: 30 mg via ORAL
  Filled 2019-06-28: qty 1

## 2019-06-28 NOTE — Progress Notes (Signed)
Pt. Lying in bed sleeping.

## 2019-06-28 NOTE — Progress Notes (Signed)
PROGRESS NOTE    [name] [name]  [id number] DOB: 1956-05-13 DOA: 06/26/2019 PCP: [name], MD  Brief Narrative: 63 year old male with history of diffuse CAD, atrial flutter on Xarelto, chronic diastolic CHF, presented to [facility] emergency room with exertional chest pain in September at that time he was noted to have drop in his hemoglobin from baseline 11 to 7-8 range, he had an EGD and colonoscopy at that time which was unremarkable, followed by Dr. Therisa Doyne with [facility], plan for capsule endoscopy next week and IV iron on Tuesday -Presented to the emergency room again with exertional chest pain  Assessment & Plan:  1. Diffuse CAD, exertional chest pain -last cath 12/2016, noted far distal LAD with 90% stenosis, patent proximal and distal RCA stents -Appreciate cardiology input, situation complicated by iron deficiency anemia in the setting of Xarelto use, recent GI work-up was unremarkable, remains on Xarelto-now on hold for possible diagnostic cath -Increased Imdur dose [facility] gastroenterology planning to do capsule endoscopy tomorrow  2.  Iron deficiency anemia -Subacute and progressive for 2 to 3 months now, hospitalized in September for same, -EGD and colonoscopy were unremarkable, capsule endoscopy scheduled for next week by Dr. Therisa Doyne, will notified [facility] to see if this can be expedited -Given IV iron yesterday [facility] gastroenterology consulting, plan for capsule endoscopy tomorrow, appreciate Dr. Kathline Magic help -hb 8.9 this morning  3.    Acute on chronic diastolic CHF -Give additional dose of IV Lasix today  4.  Atrial flutter -Continue carvedilol and sotalol -Holding Xarelto in anticipation of possible left heart cath  5. type 2 diabetes mellitus -CBGs are stable, hemoglobin A1c was 6.8 in April suggesting good control -Continue Lantus and sliding scale, recheck A1c  6. OSA -continue CPAP  7. CKD 2 -stable  DVT prophylaxis: lovenox Code  Status: Full Code Family Communication: No family at bedside, discussed with daughters via telephone Disposition Plan: Home pending cardiac and GI work-up  Consultants:   Cardiology  [facility]   Procedures:   Antimicrobials:    Subjective: -No further chest pain, has some dyspnea  Objective: Vitals:   06/28/19 0408 06/28/19 0513 06/28/19 0600 06/28/19 0722  BP: (!) 162/63  137/80 (!) 154/86  Pulse: 61  64 63  Resp: 18   20  Temp: 98.7 F (37.1 C)   99.1 F (37.3 C)  TempSrc: Oral   Oral  SpO2: 97%   100%  Weight:  91.5 kg    Height:        Intake/Output Summary (Last 24 hours) at 06/28/2019 1116 Last data filed at 06/27/2019 1450 Gross per 24 hour  Intake 350 ml  Output -  Net 350 ml   Filed Weights   06/26/19 1543 06/27/19 1346 06/28/19 0513  Weight: 95.3 kg 91.9 kg 91.5 kg    Examination:  Gen: Awake, Alert, Oriented X 3, no distress HEENT: PERRLA, Neck supple, no JVD Lungs: Few basilar rales otherwise clear CVS: RRR,No Gallops,Rubs or new Murmurs Abd: soft, Non tender, non distended, BS present Extremities: No edema Skin: no new rashes Psychiatry: Judgement and insight appear normal. Mood & affect appropriate.     Data Reviewed:   CBC: Recent Labs  Lab 06/26/19 1550 06/27/19 0013 06/27/19 0600 06/28/19 0354  WBC 6.7 7.6 7.0 7.9  NEUTROABS  --  4.8  --   --   HGB 9.4* 9.7* 8.5* 8.9*  HCT 32.8* 33.7* 29.5* 30.6*  MCV 74.2* 74.6* 73.9* 73.7*  PLT 316 300  284 0000000   Basic Metabolic Panel: Recent Labs  Lab 06/26/19 1550 06/27/19 0013 06/27/19 0145 06/27/19 0600 06/28/19 0354  NA 138 140  --  139 141  K 3.4* 3.6  --  3.7 3.8  CL 104 109  --  109 112*  CO2 21* 21*  --  20* 20*  GLUCOSE 222* 93  --  118* 114*  BUN 13 13  --  11 14  CREATININE 1.31* 1.08  --  0.97 1.15  CALCIUM 9.0 8.9  --  8.3* 8.7*  MG  --   --  1.9  --   --    GFR: Estimated Creatinine Clearance: 70.3 mL/min (by C-G formula based on SCr of 1.15 mg/dL). Liver  Function Tests: Recent Labs  Lab 06/27/19 0013  AST 14*  ALT 19  ALKPHOS 52  BILITOT 1.0  PROT 6.9  ALBUMIN 3.2*   No results for input(s): LIPASE, AMYLASE in the last 168 hours. No results for input(s): AMMONIA in the last 168 hours. Coagulation Profile: No results for input(s): INR, PROTIME in the last 168 hours. Cardiac Enzymes: Recent Labs  Lab 06/26/19 1824  CKTOTAL 80   BNP (last 3 results) No results for input(s): PROBNP in the last 8760 hours. HbA1C: Recent Labs    06/27/19 0600  HGBA1C 6.6*   CBG: Recent Labs  Lab 06/27/19 0728 06/27/19 1156 06/27/19 1643 06/27/19 2124 06/28/19 0639  GLUCAP 113* 155* 200* 106* 174*   Lipid Profile: Recent Labs    06/27/19 0013  TRIG 62   Thyroid Function Tests: Recent Labs    06/26/19 1914  TSH 0.808   Anemia Panel: Recent Labs    06/27/19 0013  FERRITIN 18*   Urine analysis:    Component Value Date/Time   COLORURINE YELLOW 03/23/2019 [place] 03/23/2019 1343   LABSPEC 1.025 03/23/2019 1343   PHURINE 5.5 03/23/2019 1343   GLUCOSEU NEGATIVE 03/23/2019 1343   HGBUR NEGATIVE 03/23/2019 1343   HGBUR moderate 09/27/2010 1646   BILIRUBINUR NEGATIVE 03/23/2019 1343   BILIRUBINUR small (A) 03/13/2019 1620   BILIRUBINUR neg 05/18/2014 1239   KETONESUR NEGATIVE 03/23/2019 1343   PROTEINUR >=300 (A) 03/13/2019 1620   PROTEINUR 30 05/18/2014 1239   PROTEINUR NEGATIVE 11/08/2013 1042   UROBILINOGEN 0.2 03/23/2019 1343   NITRITE NEGATIVE 03/23/2019 1343   LEUKOCYTESUR NEGATIVE 03/23/2019 1343   Sepsis Labs: @LABRCNTIP (procalcitonin:4,lacticidven:4)  ) Recent Results (from the past 240 hour(s))  SARS CORONAVIRUS 2 (TAT 6-24 HRS) Nasopharyngeal Nasopharyngeal Swab     Status: None   Collection Time: 06/26/19 11:28 PM   Specimen: Nasopharyngeal Swab  Result Value Ref Range Status   SARS Coronavirus 2 NEGATIVE NEGATIVE Final    Comment: (NOTE) SARS-CoV-2 target nucleic acids are NOT  DETECTED. The SARS-CoV-2 RNA is generally detectable in upper and lower respiratory specimens during the acute phase of infection. Negative results do not preclude SARS-CoV-2 infection, do not rule out co-infections with other pathogens, and should not be used as the sole basis for treatment or other patient management decisions. Negative results must be combined with clinical observations, patient history, and epidemiological information. The expected result is Negative. Fact Sheet for Patients: SugarRoll.be Fact Sheet for Healthcare Providers: https://www.woods-mathews.com/ This test is not yet approved or cleared by the [place] FDA and  has been authorized for detection and/or diagnosis of SARS-CoV-2 by FDA under an Emergency Use Authorization (EUA). This EUA will remain  in effect (meaning this test can be used)  for the duration of the COVID-19 declaration under Section 56 4(b)(1) of the Act, 21 U.S.C. section 360bbb-3(b)(1), unless the authorization is terminated or revoked sooner. Performed at [facility], [place] [address], [place], [place] [address]   Blood Culture (routine x 2)     Status: None (Preliminary result)   Collection Time: 06/27/19  1:00 AM   Specimen: BLOOD  Result Value Ref Range Status   Specimen Description BLOOD LEFT ANTECUBITAL  Final   Special Requests   Final    BOTTLES DRAWN AEROBIC AND ANAEROBIC Blood Culture results may not be optimal due to an inadequate volume of blood received in culture bottles   Culture   Final    NO GROWTH < 12 HOURS Performed at [facility], [place] [address], [place], [place] [address]    Report Status PENDING  Incomplete  Blood Culture (routine x 2)     Status: None (Preliminary result)   Collection Time: 06/27/19  1:07 AM   Specimen: Site Not Specified; Blood  Result Value Ref Range Status   Specimen Description SITE NOT SPECIFIED  Final   Special Requests   Final     AEROBIC BOTTLE ONLY Blood Culture results may not be optimal due to an inadequate volume of blood received in culture bottles   Culture   Final    NO GROWTH < 12 HOURS Performed at [facility], [address], [place], [place] [address]    Report Status PENDING  Incomplete         Radiology Studies: Dg Chest Portable 1 View  Result Date: 06/26/2019 CLINICAL DATA:  Shortness of breath. Intermittent chest pain. EXAM: PORTABLE CHEST 1 VIEW COMPARISON:  Frontal and lateral views radiograph 04/24/2019 FINDINGS: Mild cardiomegaly, not significantly changed allowing for differences in technique. Unchanged mediastinal contours. No pulmonary edema, focal airspace disease, pleural effusion or pneumothorax. No acute osseous abnormalities are seen. IMPRESSION: Stable cardiomegaly. No acute abnormality. Electronically Signed   By: Keith Rake M.D.   On: 06/26/2019 18:38        Scheduled Meds: . amLODipine  5 mg Oral Daily  . atorvastatin  80 mg Oral Daily  . carvedilol  25 mg Oral BID WC  . furosemide  40 mg Intravenous Daily  . hydrALAZINE  75 mg Oral BID  . insulin aspart  0-15 Units Subcutaneous TID WC  . insulin glargine  35 Units Subcutaneous QPC breakfast  . isosorbide mononitrate  60 mg Oral Daily  . pantoprazole  40 mg Oral Daily  . sotalol  120 mg Oral Q12H   Continuous Infusions:   LOS: 0 days    Time spent: 64min    Domenic Polite, MD [facility] Page via [email or web address], password [email or web address] After 7PM please contact night-coverage  06/28/2019, 11:16 AM

## 2019-06-28 NOTE — Consult Note (Signed)
Referring Provider: Dr. Broadus John Primary Care Physician:  [name], MD Primary Gastroenterologist:  Dr. Therisa Doyne  Reason for Consultation:  Anemia  HPI: [name] [name] is a 63 y.o. male with multiple medical problems as stated below including coronary artery disease who has a history of iron deficiency anemia and was worked up by [facility] in September with a colonoscopy where 2 small tubular adenomas were removed and it also showed left-sided diverticulosis and small internal hemorrhoids. EGD showed a hiatal hernia and plan was for a capsule endoscopy this month. Patient on Xarelto at home that was held on admit. Heart cath planned for this admit pending anemia workup. Denies rectal bleeding. Reports one episode of nonbloody loose stools prior to admit. Denies abdominal pain. Has dizziness on occasion. Admitted for chest pain, weakness, and shortness of breath. Hgb 8.9 (8.5, 9.7). Family at bedside.  Past Medical History:  Diagnosis Date  . Anginal pain ([place])   . Anxiety   . Arrhythmia   . CAD (coronary artery disease), native coronary artery    a. Nonobstructive CAD by cath 2013 - diffuse distal and branch vessel CAD, no severe disease in the major coronaries, LV mild global hypokinesis, EF 45%. b. ETT-Sestamibi 5/14: EF 31%, small fixed inferior defect with no ischemia.  . Cholecystitis   . Chronic CHF ([place])    a. Mixed ICM/NICM (?EtOH). EF 35% in 2008. Echo 5/13: EF 60-65%, mod LVH, EF 45% on V gram in 12/2011. EF 12/2012: EF 50-55%, mild LVH, inferobasal HK, mild MR. ETT-Ses 5/14 EF 41%. Cardiac MRI 5/14: EF 44%, mild global HK, subepicardial delayed enhancement in nonspecific RV insertion pattern.  . COLONIC POLYPS, HX OF 12/30/2006  . Gout   . H/O atrial flutter 2007   a. Ablations in 2007, 2008.  [name] Heart murmur   . HEPATITIS B, CHRONIC 12/30/2006  . History of alcohol abuse   . History of hiatal hernia   . History of medication noncompliance   . History of nuclear stress test     Myoview 3/17: + chest pain; EF 33%, downsloping ST depression 2, V4-6, LVH with strain, no ischemia on images; intermediate risk   . HYPERCHOLESTEROLEMIA 07/11/2010  . Hypertension   . Left sciatic nerve pain since 04/2015  . LIVER FUNCTION TESTS, ABNORMAL, HX OF 12/30/2006  . MITRAL REGURGITATION 12/30/2006  . 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 PAF (paroxysmal atrial fibrillation) ([facility])    On coumadin  . Rectal bleeding 12/18/2011   Scheduled for colonoscopy.    . Shortness of breath dyspnea    with excertion  . Sleep apnea    "suppose to send mask but they never did" (05/03/2015)  . Type II diabetes mellitus ([place])     Past Surgical History:  Procedure Laterality Date  . A FLUTTER ABLATION  2007, 2008   catheter ablation   . CARDIAC CATHETERIZATION N/A 05/03/2015   Procedure: Right/Left Heart Cath and Coronary Angiography;  Surgeon: Larey Dresser, MD;  Location: [place] [facility] LAB;  Service: Cardiovascular;  Laterality: N/A;  . CARDIAC CATHETERIZATION N/A 05/03/2015   Procedure: Coronary Stent Intervention;  Surgeon: Jettie Booze, MD;  Location: [place] [facility] LAB;  Service: Cardiovascular;  Laterality: N/A;  . CARDIAC CATHETERIZATION N/A 11/04/2015   Procedure: Left Heart Cath and Coronary Angiography;  Surgeon: Larey Dresser, MD;  Location: [place] [facility] LAB;  Service: Cardiovascular;  Laterality: N/A;  . CARDIOVERSION N/A 08/24/2015   Procedure: CARDIOVERSION;  Surgeon: Thayer Headings, MD;  Location: [facility] ENDOSCOPY;  Service: Cardiovascular;  Laterality: N/A;  . CHOLECYSTECTOMY N/A 02/07/2016   Procedure: LAPAROSCOPIC CHOLECYSTECTOMY WITH  INTRAOPERATIVE CHOLANGIOGRAM;  Surgeon: Greer Pickerel, MD;  Location: [facility];  Service: General;  Laterality: N/A;  . COLONOSCOPY WITH PROPOFOL N/A 04/28/2019   Procedure: COLONOSCOPY WITH PROPOFOL;  Surgeon: Ronnette Juniper, MD;  Location: [place];  Service: Gastroenterology;  Laterality: N/A;  . CORONARY ANGIOPLASTY   12/05/01  . ESOPHAGOGASTRODUODENOSCOPY (EGD) WITH PROPOFOL N/A 04/27/2019   Procedure: ESOPHAGOGASTRODUODENOSCOPY (EGD) WITH PROPOFOL;  Surgeon: Ronnette Juniper, MD;  Location: [place];  Service: Gastroenterology;  Laterality: N/A;  . LEFT HEART CATH AND CORONARY ANGIOGRAPHY N/A 12/28/2016   Procedure: Left Heart Cath and Coronary Angiography;  Surgeon: Larey Dresser, MD;  Location: [place] [facility] LAB;  Service: Cardiovascular;  Laterality: N/A;  . LEFT HEART CATHETERIZATION WITH CORONARY ANGIOGRAM N/A 12/19/2011   Procedure: LEFT HEART CATHETERIZATION WITH CORONARY ANGIOGRAM;  Surgeon: Larey Dresser, MD;  Location: [facility] CATH LAB;  Service: Cardiovascular;  Laterality: N/A;  . OSTEOCHONDROMA EXCISION Left 1972   "took bone tumor off my shoulder"  . POLYPECTOMY  04/28/2019   Procedure: POLYPECTOMY;  Surgeon: Ronnette Juniper, MD;  Location: [place];  Service: Gastroenterology;;    Prior to Admission medications   Medication Sig Start Date End Date Taking? Authorizing Provider  ACCU-CHEK SOFTCLIX LANCETS lancets Use as instructed for three times daily testing of blood glucose 09/06/16  Yes Jegede, Olugbemiga E, MD  acetaminophen (TYLENOL) 500 MG tablet Take 1,000 mg by mouth every 6 (six) hours as needed (pain).   Yes [provider]  amLODipine (NORVASC) 5 MG tablet Take 1 tablet (5 mg total) by mouth daily. 09/29/18  Yes Charlott Rakes, MD  Blood Glucose Monitoring Suppl (ACCU-CHEK AVIVA PLUS) w/Device KIT Use as directed for 3 times daily testing of blood glucose 09/06/16  Yes Jegede, Olugbemiga E, MD  carvedilol (COREG) 25 MG tablet Take 1 tablet (25 mg total) by mouth 2 (two) times daily with a meal. 09/29/18  Yes Newlin, Enobong, MD  furosemide (LASIX) 20 MG tablet TAKE TWO TABLETS BY MOUTH IN THE MORNING, THEN TAKE ONE IN THE EVENING Patient taking differently: Take 20-40 mg by mouth See admin instructions. Take 2 tablets (40 mg) by mouth every morning and 1 tablet (20 mg) in the  evening 09/29/18  Yes Newlin, Enobong, MD  glucose blood (ACCU-CHEK AVIVA PLUS) test strip Use as instructed for 3 times daily testing of blood glucose 09/06/16  Yes Jegede, Olugbemiga E, MD  guaiFENesin (MUCINEX PO) Take 1 tablet by mouth 2 (two) times daily as needed (cough/congestion).   Yes [provider]  hydrALAZINE (APRESOLINE) 50 MG tablet Take 1.5 tablets (75 mg total) by mouth 2 (two) times daily. 04/28/19  Yes Swayze, Ava, DO  Insulin Glargine (LANTUS SOLOSTAR) 100 UNIT/ML Solostar Pen Inject 35 Units into the skin every morning. And pen needles 1/day Patient taking differently: Inject 35 Units into the skin daily after breakfast.  07/08/18  Yes Newlin, Charlane Ferretti, MD  isosorbide mononitrate (IMDUR) 30 MG 24 hr tablet Take 1 tablet (30 mg total) by mouth daily. 10/28/18  Yes Larey Dresser, MD  Lancet Devices [facility]) lancets Use as instructed for 3 times daily testing of blood glucose 09/06/16  Yes Tresa Garter, MD  metFORMIN (GLUCOPHAGE) 1000 MG tablet Take 1 tablet (1,000 mg total) by mouth 2 (two) times daily with a meal. 05/02/19  Yes Grier Mitts  R, MD  Multiple Vitamin (MULTIVITAMIN WITH MINERALS) TABS tablet Take 1 tablet by mouth daily. 04/29/19  Yes Swayze, Ava, DO  nitroGLYCERIN (NITROSTAT) 0.4 MG SL tablet Place 1 tablet (0.4 mg total) under the tongue every 5 (five) minutes as needed for chest pain (x 3 tabs daily). Patient taking differently: Place 0.4 mg under the tongue every 5 (five) minutes x 3 doses as needed for chest pain.  09/29/18  Yes Charlott Rakes, MD  ondansetron (ZOFRAN-ODT) 4 MG disintegrating tablet Take 1 tablet (4 mg total) by mouth every 8 (eight) hours as needed for nausea or vomiting. 03/13/19  Yes Burky, Natalie B, NP  pantoprazole (PROTONIX) 40 MG tablet Take 1 tablet (40 mg total) by mouth daily. 04/28/19 04/27/20 Yes Swayze, Ava, DO  rivaroxaban (XARELTO) 20 MG TABS tablet Take 1 tablet (20 mg total) by mouth daily. 04/29/19  Yes  Swayze, Ava, DO  sotalol (BETAPACE) 120 MG tablet TAKE 1 TABLET BY MOUTH EVERY 12 HOURS Patient taking differently: Take 120 mg by mouth every 12 (twelve) hours.  04/03/19  Yes Charlott Rakes, MD  TRUEPLUS LANCETS 28G MISC 1 each by Does not apply route 3 (three) times daily. 08/22/16  Yes Tresa Garter, MD  atorvastatin (LIPITOR) 80 MG tablet Take 1 tablet (80 mg total) by mouth daily. 09/29/18   Charlott Rakes, MD    Scheduled Meds: . amLODipine  5 mg Oral Daily  . atorvastatin  80 mg Oral Daily  . carvedilol  25 mg Oral BID WC  . enoxaparin (LOVENOX) injection  40 mg Subcutaneous Q24H  . furosemide  40 mg Intravenous Daily  . hydrALAZINE  75 mg Oral BID  . insulin aspart  0-15 Units Subcutaneous TID WC  . insulin glargine  35 Units Subcutaneous QPC breakfast  . isosorbide mononitrate  60 mg Oral Daily  . pantoprazole  40 mg Oral Daily  . sotalol  120 mg Oral Q12H   Continuous Infusions: PRN Meds:.acetaminophen, nitroGLYCERIN  Allergies as of 06/26/2019 - Review Complete 06/26/2019  Allergen Reaction Noted  . Shrimp [shellfish allergy] Nausea And Vomiting 10/19/2018  . Ace inhibitors Cough 10/16/2013  . Other Hives and Other (See Comments) 12/20/2011    Family History  Problem Relation Age of Onset  . Diabetes Mother   . Hypertension Mother   . Heart attack Neg Hx   . Stroke Neg Hx     Social History   Socioeconomic History  . Marital status: Widowed    Spouse name: Not on file  . Number of children: 2  . Years of education: Not on file  . Highest education level: Not on file  Occupational History    Employer: UNEMPLOYED  Social Needs  . Financial resource strain: Not on file  . Food insecurity    Worry: Not on file    Inability: Not on file  . Transportation needs    Medical: Not on file    Non-medical: Not on file  Tobacco Use  . Smoking status: Former Smoker    Packs/day: 4.00    Years: 18.00    Pack years: 72.00    Types: Cigarettes    Quit  date: 08/14/1983    Years since quitting: 35.8  . Smokeless tobacco: Never Used  Substance and Sexual Activity  . Alcohol use: Yes    Alcohol/week: 16.0 standard drinks    Types: 6 Cans of beer, 10 Shots of liquor per week    Comment: occasionally  . Drug use: Yes  Types: Marijuana    Comment: occasionally  . Sexual activity: Never  Lifestyle  . Physical activity    Days per week: Not on file    Minutes per session: Not on file  . Stress: Not on file  Relationships  . Social Herbalist on phone: Not on file    Gets together: Not on file    Attends religious service: Not on file    Active member of club or organization: Not on file    Attends meetings of clubs or organizations: Not on file    Relationship status: Not on file  . Intimate partner violence    Fear of current or ex partner: Not on file    Emotionally abused: Not on file    Physically abused: Not on file    Forced sexual activity: Not on file  Other Topics Concern  . Not on file  Social History Narrative  . Not on file    Review of Systems: All negative except as stated above in HPI.  Physical Exam: Vital signs: Vitals:   06/28/19 0722 06/28/19 1255  BP: (!) 154/86 (!) 112/59  Pulse: 63 (!) 55  Resp: 20 20  Temp: 99.1 F (37.3 C) 98.2 F (36.8 C)  SpO2: 100% 100%   Last BM Date: 06/27/19 General:   Alert,  Well-developed, well-nourished, pleasant and cooperative in NAD Head: normocephalic, atraumatic Eyes: anicteric sclera ENT: oropharynx clear Neck: supple, nontender Lungs:  Clear throughout to auscultation.   No wheezes, crackles, or rhonchi. No acute distress. Heart:  Regular rate and rhythm; no murmurs, clicks, rubs,  or gallops. Abdomen: soft, nontender, nondistended, +BS  Rectal:  Deferred Ext: no edema  GI:  Lab Results: Recent Labs    06/27/19 0013 06/27/19 0600 06/28/19 0354  WBC 7.6 7.0 7.9  HGB 9.7* 8.5* 8.9*  HCT 33.7* 29.5* 30.6*  PLT 300 284 324    BMET Recent Labs    06/27/19 0013 06/27/19 0600 06/28/19 0354  NA 140 139 141  K 3.6 3.7 3.8  CL 109 109 112*  CO2 21* 20* 20*  GLUCOSE 93 118* 114*  BUN '13 11 14  ' CREATININE 1.08 0.97 1.15  CALCIUM 8.9 8.3* 8.7*   LFT Recent Labs    06/27/19 0013  PROT 6.9  ALBUMIN 3.2*  AST 14*  ALT 19  ALKPHOS 52  BILITOT 1.0   PT/INR No results for input(s): LABPROT, INR in the last 72 hours.   Studies/Results: Dg Chest Portable 1 View  Result Date: 06/26/2019 CLINICAL DATA:  Shortness of breath. Intermittent chest pain. EXAM: PORTABLE CHEST 1 VIEW COMPARISON:  Frontal and lateral views radiograph 04/24/2019 FINDINGS: Mild cardiomegaly, not significantly changed allowing for differences in technique. Unchanged mediastinal contours. No pulmonary edema, focal airspace disease, pleural effusion or pneumothorax. No acute osseous abnormalities are seen. IMPRESSION: Stable cardiomegaly. No acute abnormality. Electronically Signed   By: Keith Rake M.D.   On: 06/26/2019 18:38    Impression/Plan: Symptomatic anemia in need of a capsule endoscopy prior to updated heart cath. NPO p MN for capsule endoscopy tomorrow morning. Xarelto on hold. Dr. Therisa Doyne will f/u with capsule endoscopy results on Tuesday, 06/30/19.    LOS: 0 days   Lear Ng  06/28/2019, 1:55 PM  Questions please call [phone]

## 2019-06-28 NOTE — Progress Notes (Signed)
   06/28/19 1851  MEWS Assessment  Is this an acute change? No      Vital Signs MEWS/VS Documentation       06/28/2019 0900 06/28/2019 0928 06/28/2019 1255 06/28/2019 1655   MEWS Score:  0  --  0  0   MEWS Score Color:  Green  --  Green  Green   Resp:  --  --  20  --   Pulse:  --  --  (!) 55  63   BP:  --  --  (!) 112/59  (!) 125/59   Temp:  --  --  98.2 F (36.8 C)  98.5 F (36.9 C)   O2 Device:  --  --  Nasal Cannula  Nasal Cannula   O2 Flow Rate (L/min):  --  --  2 L/min  --   Level of Consciousness:  Alert  --  --  --            Gladis Riffle 06/28/2019,6:52 PM

## 2019-06-28 NOTE — Progress Notes (Signed)
Progress Note  Patient Name: [name] [name] Date of Encounter: 06/28/2019  Primary Cardiologist: Loralie Champagne, MD   Subjective   Overall rested fairly comfortably.  Had some bradycardia during sleep.  2.4-second pause with sleep  Inpatient Medications    Scheduled Meds: . amLODipine  5 mg Oral Daily  . atorvastatin  80 mg Oral Daily  . carvedilol  25 mg Oral BID WC  . furosemide  40 mg Intravenous Daily  . hydrALAZINE  75 mg Oral BID  . insulin aspart  0-15 Units Subcutaneous TID WC  . insulin glargine  35 Units Subcutaneous QPC breakfast  . isosorbide mononitrate  30 mg Oral Daily  . pantoprazole  40 mg Oral Daily  . sotalol  120 mg Oral Q12H   Continuous Infusions:  PRN Meds: acetaminophen, nitroGLYCERIN   Vital Signs    Vitals:   06/28/19 0408 06/28/19 0513 06/28/19 0600 06/28/19 0722  BP: (!) 162/63  137/80 (!) 154/86  Pulse: 61  64 63  Resp: 18   20  Temp: 98.7 F (37.1 C)   99.1 F (37.3 C)  TempSrc: Oral   Oral  SpO2: 97%   100%  Weight:  91.5 kg    Height:        Intake/Output Summary (Last 24 hours) at 06/28/2019 0941 Last data filed at 06/27/2019 1450 Gross per 24 hour  Intake 350 ml  Output -  Net 350 ml   Last 3 Weights 06/28/2019 06/27/2019 06/26/2019  Weight (lbs) 201 lb 12.8 oz 202 lb 9.6 oz 210 lb  Weight (kg) 91.536 kg 91.899 kg 95.255 kg      Telemetry    2.4 sec pause transient 30's sleep, heart rate occasionally in the 50s- Personally Reviewed  ECG    1-4 AFLUTTER conduction.  - Personally Reviewed  Physical Exam   GEN: No acute distress.   Neck: No JVD Cardiac: RRR, no murmurs, rubs, or gallops.  Respiratory: Clear to auscultation bilaterally. GI: Soft, nontender, non-distended  MS: No edema; No deformity. Neuro:  Nonfocal  Psych: Normal affect   Labs    High Sensitivity Troponin:   Recent Labs  Lab 06/26/19 1550 06/26/19 1745  TROPONINIHS 12 12      Chemistry Recent Labs  Lab 06/27/19 0013  06/27/19 0600 06/28/19 0354  NA 140 139 141  K 3.6 3.7 3.8  CL 109 109 112*  CO2 21* 20* 20*  GLUCOSE 93 118* 114*  BUN 13 11 14   CREATININE 1.08 0.97 1.15  CALCIUM 8.9 8.3* 8.7*  PROT 6.9  --   --   ALBUMIN 3.2*  --   --   AST 14*  --   --   ALT 19  --   --   ALKPHOS 52  --   --   BILITOT 1.0  --   --   GFRNONAA >60 >60 >60  GFRAA >60 >60 >60  ANIONGAP 10 10 9      Hematology Recent Labs  Lab 06/27/19 0013 06/27/19 0600 06/28/19 0354  WBC 7.6 7.0 7.9  RBC 4.52 3.99* 4.15*  HGB 9.7* 8.5* 8.9*  HCT 33.7* 29.5* 30.6*  MCV 74.6* 73.9* 73.7*  MCH 21.5* 21.3* 21.4*  MCHC 28.8* 28.8* 29.1*  RDW 18.8* 18.8* 18.7*  PLT 300 284 324    BNP Recent Labs  Lab 06/26/19 2215  BNP 290.8*     DDimer  Recent Labs  Lab 06/26/19 2215 06/27/19 0013  DDIMER <0.27 0.32  Radiology    Dg Chest Portable 1 View  Result Date: 06/26/2019 CLINICAL DATA:  Shortness of breath. Intermittent chest pain. EXAM: PORTABLE CHEST 1 VIEW COMPARISON:  Frontal and lateral views radiograph 04/24/2019 FINDINGS: Mild cardiomegaly, not significantly changed allowing for differences in technique. Unchanged mediastinal contours. No pulmonary edema, focal airspace disease, pleural effusion or pneumothorax. No acute osseous abnormalities are seen. IMPRESSION: Stable cardiomegaly. No acute abnormality. Electronically Signed   By: Keith Rake M.D.   On: 06/26/2019 18:38    Cardiac Studies   ECHO 04/25/19   1. The left ventricle has normal systolic function with an ejection fraction of 60-65%. The cavity size was normal. There is moderately increased left ventricular wall thickness. Left ventricular diastolic Doppler parameters are consistent with  pseudonormalization. Elevated mean left atrial pressure.  2. The right ventricle has normal systolic function. The cavity was normal. There is no increase in right ventricular wall thickness.  3. Left atrial size was moderately dilated.  4. Mitral  valve regurgitation is mild to moderate by color flow Doppler.  5. The aortic valve is tricuspid. Aortic valve regurgitation is mild by color flow Doppler.  6. The aorta is normal unless otherwise noted.   Patient Profile     63 y.o. male with known coronary artery disease multivessel, with ongoing iron deficiency anemia in the setting of chronic Xarelto for atrial flutter with GI work-up included colonoscopy endoscopy and upcoming capsule endoscopy here with unstable anginal symptoms.  Normal troponin.  Assessment & Plan    Coronary artery disease with anginal symptoms -Has had ongoing anginal type symptoms.  Last heart catheterization was 2018 and felt to have poor PCI targets.  Could things have advanced?  Could he possibly be a CABG candidate?  I increase his isosorbide to 60 mg from 30.  -Before we proceed with heart catheterization however I would like for his GI evaluation to be complete.  He was supposed to get a capsule endoscopy via [facility] in the next few days.  Dr. Broadus John has asked if [facility] can expedite this in the inpatient setting.  Since then, we are holding his Xarelto in anticipation of diagnostic heart cath.   Iron deficiency anemia -Holding Xarelto as GI completes capsule endoscopy.  His daughter brought up a good point that he did not seem to have any major bleeding on the warfarin.  Perhaps only restart anticoagulation for his atrial flutter we can use warfarin instead.  Atrial flutter -Good rate control 41.  On sotalol and carvedilol.  Most of his bradycardia seems to be mostly at rest at night.  I think is reasonable to continue with the medications as prescribed.  Acute on chronic diastolic heart failure -EF last check 65%.  No need to repeat echocardiogram at this time.  Gave him an additional Lasix dose.  Spoke to daughters on FaceTime.  For questions or updates, please contact [place] Please consult [email or web address] for contact info under        Signed,  Candee Furbish, MD  06/28/2019, 9:41 AM

## 2019-06-28 NOTE — Plan of Care (Signed)

## 2019-06-28 NOTE — Progress Notes (Signed)
Patient resting comfortably during shift report. Denies complaints.  

## 2019-06-28 NOTE — Progress Notes (Addendum)
Page to Cardiology PA re CCMD notification of brady to 30s with 2.39 second pause and lasix order.  Lykens. Brady to 38 with 2.3 sec pause, sleeping. Would also like to clarify Lasix order.    Patient sleeping without O2 upon assessment after CCMD notification of brady/pause.

## 2019-06-28 NOTE — Progress Notes (Signed)
   Vital Signs MEWS/VS Documentation      06/28/2019 0722 06/28/2019 0800 06/28/2019 0810 06/28/2019 0829   MEWS Score:  0  0  2  0   MEWS Score Color:  Green  Green  Yellow  Green   Resp:  20  -  -  -   Pulse:  63  -  -  -   BP:  (!) 154/86  -  -  -   Temp:  99.1 F (37.3 C)  -  -  -   O2 Device:  Room Air  -  -  -   Level of Consciousness:  -  Alert  -  -       Pt with bradycardic event during sleep.  Per Cards PA Weaver, no change in orders. Continue with medications, as patient is maintaining HR 60s while awake.      Maud Deed Tobias-Diakun 06/28/2019,8:39 AM

## 2019-06-28 NOTE — Progress Notes (Signed)
Page to cardiology PA to notify of bradycardia and pause. Pt was sleeping at the time of the event. Pt woke without difficulty, and denied any discomfort/symptoms.   Patient without O2.    06/28/19 0829  MEWS Assessment  Is this an acute change? Yes  MEWS guidelines implemented *See Row Information* Yellow  Provider Notification  Provider Name/Title Page to Cardiology PA [facility]  Date Provider Notified 06/28/19  Time Provider Notified [phone]  Notification Type Page  Notification Reason Other (Comment) (Change in telemetry, pt asymtpomatic.)

## 2019-06-29 ENCOUNTER — Encounter (HOSPITAL_COMMUNITY)
Admission: EM | Disposition: A | Discharge status: Home / Self Care | Payer: Self-pay | Source: Home / Self Care | Attending: Internal Medicine

## 2019-06-29 HISTORY — PX: GIVENS CAPSULE STUDY: SHX5432

## 2019-06-29 LAB — GLUCOSE, CAPILLARY
Glucose-Capillary: 110 mg/dL — ABNORMAL HIGH (ref 70–99)
Glucose-Capillary: 101 mg/dL — ABNORMAL HIGH (ref 70–99)
Glucose-Capillary: 117 mg/dL — ABNORMAL HIGH (ref 70–99)
Glucose-Capillary: 130 mg/dL — ABNORMAL HIGH (ref 70–99)

## 2019-06-29 LAB — CBC
HCT: 27.9 % — ABNORMAL LOW (ref 39.0–52.0)
Hemoglobin: 8.1 g/dL — ABNORMAL LOW (ref 13.0–17.0)
MCH: 21.4 pg — ABNORMAL LOW (ref 26.0–34.0)
MCHC: 29 g/dL — ABNORMAL LOW (ref 30.0–36.0)
MCV: 73.8 fL — ABNORMAL LOW (ref 80.0–100.0)
Platelets: 289 10*3/uL (ref 150–400)
RBC: 3.78 MIL/uL — ABNORMAL LOW (ref 4.22–5.81)
RDW: 19 % — ABNORMAL HIGH (ref 11.5–15.5)
WBC: 8.3 10*3/uL (ref 4.0–10.5)
nRBC: 0 % (ref 0.0–0.2)

## 2019-06-29 LAB — BASIC METABOLIC PANEL
Anion gap: 7 (ref 5–15)
BUN: 14 mg/dL (ref 8–23)
CO2: 22 mmol/L (ref 22–32)
Calcium: 8.6 mg/dL — ABNORMAL LOW (ref 8.9–10.3)
Chloride: 112 mmol/L — ABNORMAL HIGH (ref 98–111)
Creatinine, Ser: 1.14 mg/dL (ref 0.61–1.24)
GFR calc Af Amer: >60 mL/min (ref >60)
GFR calc non Af Amer: >60 mL/min (ref >60)
Glucose, Bld: 115 mg/dL — ABNORMAL HIGH (ref 70–99)
Potassium: 3.6 mmol/L (ref 3.5–5.1)
Sodium: 141 mmol/L (ref 135–145)

## 2019-06-29 SURGERY — IMAGING PROCEDURE, GI TRACT, INTRALUMINAL, VIA CAPSULE
Anesthesia: LOCAL

## 2019-06-29 MED ORDER — FUROSEMIDE 20 MG PO TABS: 20.0000 mg | ORAL_TABLET | Freq: Every day | ORAL | Status: DC

## 2019-06-29 MED ORDER — FUROSEMIDE 40 MG PO TABS: 40.0000 mg | ORAL_TABLET | Freq: Every day | ORAL | Status: DC

## 2019-06-29 MED ORDER — FUROSEMIDE 10 MG/ML IJ SOLN
40.0000 mg | Freq: Two times a day (BID) | INTRAMUSCULAR | Status: AC
  Administered 2019-06-29 (×2): 40 mg via INTRAVENOUS
  Filled 2019-06-29 (×2): qty 4

## 2019-06-29 MED ORDER — SODIUM CHLORIDE 0.9 % IV SOLN
510.0000 mg | Freq: Once | INTRAVENOUS | Status: AC
  Administered 2019-06-29: 510 mg via INTRAVENOUS
  Filled 2019-06-29: qty 17

## 2019-06-29 MED ORDER — ISOSORBIDE MONONITRATE ER 60 MG PO TB24
90.0000 mg | ORAL_TABLET | Freq: Every day | ORAL | Status: DC
  Administered 2019-06-29 – 2019-07-01 (×3): 90 mg via ORAL
  Filled 2019-06-29 (×4): qty 1

## 2019-06-29 MED ORDER — FUROSEMIDE 10 MG/ML IJ SOLN: 40.0000 mg | Freq: Every day | INTRAMUSCULAR | Status: DC

## 2019-06-29 SURGICAL SUPPLY — 1 items: TOWEL COTTON PACK 4EA (MISCELLANEOUS) ×4 IMPLANT

## 2019-06-29 NOTE — Progress Notes (Signed)
Per instructions from endoscopy staff/ RN  capsule machine can be removed at 5 pm and patient can resume his normal diet at 5 pm. Diet changed from NPO to Carb modified diet as it was before per instructions.

## 2019-06-29 NOTE — Progress Notes (Addendum)
Removed capsule mashine and left it with patient at beside for endoscopy pick up in the morning.

## 2019-06-29 NOTE — Progress Notes (Signed)
PROGRESS NOTE    Mackenzy Cerasuolo  [id number] DOB: 27-Aug-1955 DOA: 06/26/2019 PCP: [name], MD  Brief Narrative: 63 year old male with history of diffuse CAD, atrial flutter on Xarelto, chronic diastolic CHF, presented to [facility] emergency room with exertional chest pain in September at that time he was noted to have drop in his hemoglobin from baseline 11 to 7-8 range, he had an EGD and colonoscopy at that time which was unremarkable, followed by Dr. Therisa Doyne with [facility], plan for capsule endoscopy next week and IV iron on Tuesday -Presented to the emergency room again with exertional chest pain  Assessment & Plan:  1. Diffuse CAD, exertional chest pain -last cath 12/2016, noted far distal LAD with 90% stenosis, patent proximal and distal RCA stents -Appreciate cardiology input, situation complicated by iron deficiency anemia in the setting of Xarelto use for atrial flutter, recent GI work-up was unremarkable, remains on Xarelto-now on hold, plan for possible left and right heart cath on Wednesday -Increased Imdur dose  2.  Iron deficiency anemia -Subacute and progressive for 2 to 3 months now, hospitalized in September for same, -EGD and colonoscopy were unremarkable -Given IV iron 11/14 [facility] gastroenterology following, capsule endoscopy today, appreciate Dr. Kathline Magic help -hb 8.1 this morning  3.    Acute on chronic diastolic CHF -Last echo [id number] with preserved EF, moderate LVH mild to moderate MR -IV Lasix today  4.  Atrial flutter -Continue carvedilol and sotalol -Holding Xarelto in anticipation of possible left heart cath -long term,  ablation will be a good option  5. type 2 diabetes mellitus -CBGs are stable, hemoglobin A1c was 6.8 in April suggesting good control -Continue Lantus and sliding scale, recheck A1c -CBGs are stable  6. OSA -continue CPAP  7. CKD 2 -stable  DVT prophylaxis: lovenox Code Status: Full Code Family  Communication: No family at bedside, discussed with daughters via telephone yesterday Disposition Plan: Home pending cardiac and GI work-up  Consultants:   Cardiology  [facility]   Procedures:   Antimicrobials:    Subjective: -Complains of dyspnea on exertion, denies any chest pain this morning  Objective: Vitals:   06/29/19 0513 06/29/19 0726 06/29/19 1050 06/29/19 1216  BP: 122/72 (!) 106/56 139/82 128/74  Pulse: (!) 57 (!) 53 62 61  Resp: 18 16 17 16   Temp: 98.3 F (36.8 C) 98.5 F (36.9 C) 98.6 F (37 C) 98.5 F (36.9 C)  TempSrc: Oral Oral Oral Oral  SpO2: 100% 100% 98% 100%  Weight: 90.7 kg     Height:        Intake/Output Summary (Last 24 hours) at 06/29/2019 1516 Last data filed at 06/29/2019 1511 Gross per 24 hour  Intake 720 ml  Output 1150 ml  Net -430 ml   Filed Weights   06/27/19 1346 06/28/19 0513 06/29/19 0513  Weight: 91.9 kg 91.5 kg 90.7 kg    Examination:  Gen: Awake, Alert, Oriented X 3, no distress HEENT: PERRLA, Neck supple, no JVD Lungs: Few basilar rails CVS: RRR,No Gallops,Rubs or new Murmurs Abd: soft, Non tender, non distended, BS present Extremities: No edema Skin: no new rashes Psychiatry: Judgement and insight appear normal. Mood & affect appropriate.     Data Reviewed:   CBC: Recent Labs  Lab 06/26/19 1550 06/27/19 0013 06/27/19 0600 06/28/19 0354 06/29/19 0535  WBC 6.7 7.6 7.0 7.9 8.3  NEUTROABS  --  4.8  --   --   --   HGB 9.4* 9.7* 8.5* 8.9*  8.1*  HCT 32.8* 33.7* 29.5* 30.6* 27.9*  MCV 74.2* 74.6* 73.9* 73.7* 73.8*  PLT 316 300 284 324 [id number]   Basic Metabolic Panel: Recent Labs  Lab 06/26/19 1550 06/27/19 0013 06/27/19 0145 06/27/19 0600 06/28/19 0354 06/29/19 0535  NA 138 140  --  139 141 141  K 3.4* 3.6  --  3.7 3.8 3.6  CL 104 109  --  109 112* 112*  CO2 21* 21*  --  20* 20* 22  GLUCOSE 222* 93  --  118* 114* 115*  BUN 13 13  --  11 14 14   CREATININE 1.31* 1.08  --  0.97 1.15 1.14  CALCIUM 9.0  8.9  --  8.3* 8.7* 8.6*  MG  --   --  1.9  --   --   --    GFR: Estimated Creatinine Clearance: 70.9 mL/min (by C-G formula based on SCr of 1.14 mg/dL). Liver Function Tests: Recent Labs  Lab 06/27/19 0013  AST 14*  ALT 19  ALKPHOS 52  BILITOT 1.0  PROT 6.9  ALBUMIN 3.2*   No results for input(s): LIPASE, AMYLASE in the last 168 hours. No results for input(s): AMMONIA in the last 168 hours. Coagulation Profile: No results for input(s): INR, PROTIME in the last 168 hours. Cardiac Enzymes: Recent Labs  Lab 06/26/19 1824  CKTOTAL 80   BNP (last 3 results) No results for input(s): PROBNP in the last 8760 hours. HbA1C: Recent Labs    06/27/19 0600  HGBA1C 6.6*   CBG: Recent Labs  Lab 06/28/19 1149 06/28/19 1600 06/28/19 2107 06/29/19 0628 06/29/19 1136  GLUCAP 128* 142* 157* 110* 101*   Lipid Profile: Recent Labs    06/27/19 0013  TRIG 62   Thyroid Function Tests: Recent Labs    06/26/19 1914  TSH 0.808   Anemia Panel: Recent Labs    06/27/19 0013  FERRITIN 18*   Urine analysis:    Component Value Date/Time   COLORURINE YELLOW 03/23/2019 [place] 03/23/2019 1343   LABSPEC 1.025 03/23/2019 1343   PHURINE 5.5 03/23/2019 1343   GLUCOSEU NEGATIVE 03/23/2019 1343   HGBUR NEGATIVE 03/23/2019 1343   HGBUR moderate 09/27/2010 1646   BILIRUBINUR NEGATIVE 03/23/2019 1343   BILIRUBINUR small (A) 03/13/2019 1620   BILIRUBINUR neg 05/18/2014 1239   KETONESUR NEGATIVE 03/23/2019 1343   PROTEINUR >=300 (A) 03/13/2019 1620   PROTEINUR 30 05/18/2014 1239   PROTEINUR NEGATIVE 11/08/2013 1042   UROBILINOGEN 0.2 03/23/2019 1343   NITRITE NEGATIVE 03/23/2019 1343   LEUKOCYTESUR NEGATIVE 03/23/2019 1343   Sepsis Labs: @LABRCNTIP (procalcitonin:4,lacticidven:4)  ) Recent Results (from the past 240 hour(s))  SARS CORONAVIRUS 2 (TAT 6-24 HRS) Nasopharyngeal Nasopharyngeal Swab     Status: None   Collection Time: 06/26/19 11:28 PM   Specimen:  Nasopharyngeal Swab  Result Value Ref Range Status   SARS Coronavirus 2 NEGATIVE NEGATIVE Final    Comment: (NOTE) SARS-CoV-2 target nucleic acids are NOT DETECTED. The SARS-CoV-2 RNA is generally detectable in upper and lower respiratory specimens during the acute phase of infection. Negative results do not preclude SARS-CoV-2 infection, do not rule out co-infections with other pathogens, and should not be used as the sole basis for treatment or other patient management decisions. Negative results must be combined with clinical observations, patient history, and epidemiological information. The expected result is Negative. Fact Sheet for Patients: SugarRoll.be Fact Sheet for Healthcare Providers: https://www.woods-mathews.com/ This test is not yet approved or cleared by the [place]  FDA and  has been authorized for detection and/or diagnosis of SARS-CoV-2 by FDA under an Emergency Use Authorization (EUA). This EUA will remain  in effect (meaning this test can be used) for the duration of the COVID-19 declaration under Section 56 4(b)(1) of the Act, 21 U.S.C. section 360bbb-3(b)(1), unless the authorization is terminated or revoked sooner. Performed at [facility], [place] [address], [place], [place] [address]   Blood Culture (routine x 2)     Status: None (Preliminary result)   Collection Time: 06/27/19  1:00 AM   Specimen: BLOOD  Result Value Ref Range Status   Specimen Description BLOOD LEFT ANTECUBITAL  Final   Special Requests   Final    BOTTLES DRAWN AEROBIC AND ANAEROBIC Blood Culture results may not be optimal due to an inadequate volume of blood received in culture bottles   Culture   Final    NO GROWTH 2 DAYS Performed at [facility], [place] [address] [place], [place] [address]    Report Status PENDING  Incomplete  Blood Culture (routine x 2)     Status: None (Preliminary result)   Collection Time: 06/27/19   1:07 AM   Specimen: Site Not Specified; Blood  Result Value Ref Range Status   Specimen Description SITE NOT SPECIFIED  Final   Special Requests   Final    AEROBIC BOTTLE ONLY Blood Culture results may not be optimal due to an inadequate volume of blood received in culture bottles   Culture   Final    NO GROWTH 2 DAYS Performed at [facility], [place] [address] [place], [place] [address]    Report Status PENDING  Incomplete         Radiology Studies: No results found.      Scheduled Meds: . amLODipine  5 mg Oral Daily  . atorvastatin  80 mg Oral Daily  . carvedilol  25 mg Oral BID WC  . enoxaparin (LOVENOX) injection  40 mg Subcutaneous Q24H  . furosemide  40 mg Intravenous BID  . hydrALAZINE  75 mg Oral BID  . insulin aspart  0-15 Units Subcutaneous TID WC  . insulin glargine  35 Units Subcutaneous QPC breakfast  . isosorbide mononitrate  90 mg Oral Daily  . pantoprazole  40 mg Oral Daily  . sotalol  120 mg Oral Q12H   Continuous Infusions:   LOS: 1 day    Time spent: 72min    Domenic Polite, MD [facility] Page via [email or web address], password [email or web address] After 7PM please contact night-coverage  06/29/2019, 3:16 PM

## 2019-06-29 NOTE — Plan of Care (Signed)

## 2019-06-29 NOTE — Progress Notes (Signed)
During the shift change patient stated that he went to the bathroom, had a BM and noticed some blood in stool.  Patient informed to report if more bleeding happens. Will continue to monitor.  Travares Nelles, RN

## 2019-06-29 NOTE — Progress Notes (Addendum)
Progress Note  Patient Name: [name] [name] Date of Encounter: 06/29/2019  Primary Cardiologist: Loralie Champagne, MD   Subjective   [phone] male seen in HF clinic with PMH HFrEF with resolution, diastolic dysfunction, atrial flutter, HTN, and chronic anemia who presented with generalized weakness, exertional angina, and SOB. He states symptoms are similar to previous admission. He had follow-up with HF clinic but his appointment had to be rescheduled so he came to the ED as he had been feeling worse lately. He has been having increased chest pressure when walking around his house and severe SOB. He is having SOB at rest as well but not chest pressure or pain at rest. He did feel like he had increased fluid in his abdomen, which is where he usually carries extra fluid. He denies LE swelling. He feels like he has put off a good amount of fluid since coming into the hospital and is able to breath better.  He has not had any changes in medications but states he was not able to keep up with his evening medications the week and a half before coming in and was not taking them.   Last echo 04/2019 EF 60-65%, LVH, elevated left atrial pressures, moderate mitral regurge.  Cath 12/2016 showed multivessel disease with no ideal targets for intervention and recommendations for aggressive medical management.    Inpatient Medications    Scheduled Meds:  amLODipine  5 mg Oral Daily   atorvastatin  80 mg Oral Daily   carvedilol  25 mg Oral BID WC   enoxaparin (LOVENOX) injection  40 mg Subcutaneous Q24H   furosemide  40 mg Intravenous Daily   hydrALAZINE  75 mg Oral BID   insulin aspart  0-15 Units Subcutaneous TID WC   insulin glargine  35 Units Subcutaneous QPC breakfast   isosorbide mononitrate  60 mg Oral Daily   pantoprazole  40 mg Oral Daily   sotalol  120 mg Oral Q12H   Continuous Infusions:  PRN Meds: acetaminophen, nitroGLYCERIN   Vital Signs    Vitals:   06/28/19 1655  06/28/19 2025 06/29/19 0513 06/29/19 0726  BP: (!) 125/59 122/67 122/72 (!) 106/56  Pulse: 63 62 (!) 57 (!) 53  Resp:  18 18 16   Temp: 98.5 F (36.9 C) 99 F (37.2 C) 98.3 F (36.8 C) 98.5 F (36.9 C)  TempSrc: Oral Oral Oral Oral  SpO2: 100% 99% 100% 100%  Weight:   90.7 kg   Height:        Intake/Output Summary (Last 24 hours) at 06/29/2019 0951 Last data filed at 06/29/2019 0514 Gross per 24 hour  Intake 720 ml  Output 1650 ml  Net -930 ml   Filed Weights   06/27/19 1346 06/28/19 0513 06/29/19 0513  Weight: 91.9 kg 91.5 kg 90.7 kg    Telemetry    Atrial flutter with rate 62 - Personally Reviewed  ECG    No new ECG- Personally Reviewed  Physical Exam   GEN: No acute distress.   Neck: +JVD Cardiac: irregular rate & rhythm, no murmurs, rubs, or gallops.  Respiratory: Clear to auscultation bilaterally, no w/r/r  GI: Soft, nontender, non-distended MS: No LE edema; No deformity. Neuro:  Nonfocal, CN grossly intact Psych: Normal affect   Labs    Chemistry Recent Labs  Lab 06/27/19 0013 06/27/19 0600 06/28/19 0354 06/29/19 0535  NA 140 139 141 141  K 3.6 3.7 3.8 3.6  CL 109 109 112* 112*  CO2 21* 20* 20*  22  GLUCOSE 93 118* 114* 115*  BUN 13 11 14 14   CREATININE 1.08 0.97 1.15 1.14  CALCIUM 8.9 8.3* 8.7* 8.6*  PROT 6.9  --   --   --   ALBUMIN 3.2*  --   --   --   AST 14*  --   --   --   ALT 19  --   --   --   ALKPHOS 52  --   --   --   BILITOT 1.0  --   --   --   GFRNONAA >60 >60 >60 >60  GFRAA >60 >60 >60 >60  ANIONGAP 10 10 9 7      Hematology Recent Labs  Lab 06/27/19 0600 06/28/19 0354 06/29/19 0535  WBC 7.0 7.9 8.3  RBC 3.99* 4.15* 3.78*  HGB 8.5* 8.9* 8.1*  HCT 29.5* 30.6* 27.9*  MCV 73.9* 73.7* 73.8*  MCH 21.3* 21.4* 21.4*  MCHC 28.8* 29.1* 29.0*  RDW 18.8* 18.7* 19.0*  PLT 284 324 289    Cardiac EnzymesNo results for input(s): TROPONINI in the last 168 hours. No results for input(s): TROPIPOC in the last 168 hours.    BNP Recent Labs  Lab 06/26/19 2215  BNP 290.8*     DDimer  Recent Labs  Lab 06/26/19 2215 06/27/19 0013  DDIMER <0.27 0.32     Radiology    No results found.  Cardiac Studies   None this admission  Patient Profile     63yo male seen in HF clinic with PMH HFrEF with resolution, diastolic dysfunction, atrial flutter, HTN, and chronic anemia who presented with exertional angina and SOB at rest.   Assessment & Plan     CAD Diastolic HF Admitted with exertional angina, trops wnl. Mildly elevated BNP. Imdur increased from 30mg  to 60mg  yesterday, also on home norvasc 5mg  & hydarlazine 75 mg bid.[name] He is receiving lasix 40 mg IV and continues to have mild JVD. His symptoms of SOB and exertional angina are likely multifactorial with his extensive CAD, possibly mildly related to his anemia as well. Additionally was not taking evening medications the week and a half before coming in to the hospital.  - increase imdur to 90 mg qd - increase lasix IV 40 to bid, reassess tomorrow.   - Will plan for heart cath once endoscopy results, likely Wednesday   Atrial Flutter Rate controlled with sotalol & carvedilol. Bradycardia while sleeping. History of OSA with intermittent use of his CPAP.  - consider cardioversion as he is on sotalol but continues to be in flutter, discussed this with him  - start eliquis after endoscopy results   - CPAP ordered. Encouraged home use, will need to reorder as he returned previous one.   Acute on Chronic Anemia Capsule endoscopy today. Will f/u results.    For questions or updates, please contact [place] Please consult [email or web address] for contact info under Cardiology/STEMI.      Signed, Marty Heck, DO  06/29/2019, 9:51 AM    Patient seen with resident, agree with the above note.   Patient feels better overall, less short of breath with IV Lasix, weight trending down.  He remains in atrial flutter with HR in 60s today.  Still  thinks he would have mild chest pressure if he walked out in the hall.  Hgb trending down 8.9 => 8.1.  Capsule endoscopy started today.  He is currently off anticoagulation.   General: NAD Neck: JVP 10 cm, no thyromegaly or thyroid  nodule.  Lungs: Clear to auscultation bilaterally with normal respiratory effort. CV: Nondisplaced PMI.  Heart irregular S1/S2, no S3/S4, no murmur.  Trace ankle edema.   Abdomen: Soft, nontender, no hepatosplenomegaly, no distention.  Skin: Intact without lesions or rashes.  Neurologic: Alert and oriented x 3.  Psych: Normal affect. Extremities: No clubbing or cyanosis.  HEENT: Normal.   1. Atrial fibrillation/flutter: History of paroxysmal atrial fibrillation, had been on Xarelto and sotalol.  This admission, he was noted to be in rate-controlled atrial flutter.  He remains in flutter today.  - Xarelto stopped with anemia/suspected GI bleeding.  Will eventually need to restart anticoagulation, think that Eliquis may be better choice as it seems to cause less GI bleeding.  - Continue sotalol for now, QTc has been ok. Avoid QT-prolonging meds.  - Will need to cardiovert him to NSR eventually.  Awaiting result of capsule endoscopy, then will likely arrange for coronary angiography with worsening exertion CP (?Wednesday after we get back capsule result on Tuesday).  After that, will need to restart anticoagulation and aim for DCCV after 3-4 weeks of therapeutic anticoagulation.  - Consider atrial flutter ablation down the road.  2. Coronary artery disease: LHC in 9/16 showed diffuse CAD, most significant involving the distal RCA. Patient had DES x 2 to RCA.  Cath in 3/17 with patent RCA stents and diffuse distal and branch vessel disease concerning for poorly controlled diabetes.  Cath in 5/18 again showed diffuse CAD without good interventional target.  He has had worsening exertional chest pain over the last couple of months, now reports mild tightness in his chest just  walking around the house. This could be due to his underlying diffuse distal/branch vessel disease + the added stress of significant anemia (no progression of CAD, no interventional option) or he could have progressive CAD with possible interventional options.  - Anticoagulation is on hold.  After capsule endoscopy, if no GI intervention is needed and hgb has stabilized, will plan left/right cath on probably Wednesday. Discussed risks/benefits with patient, he agrees to procedure.  - Continue atorvastatin 80 mg daily, check lipids.  - Increase Imdur to 90 mg daily.  - Avoid ranolazine with sotalol use.  3. Acute on chronic diastolic CHF: EF [id number] on cardiac MRI in 6/14 but EF improved back to 55-60% on 2/16 echo.  However, EF down to 26% on Cardiolite in 9/16.  I think this was inaccurate.  EF was 55-60% by both LV-gram and echo in 9/16 and 55-60% on 6/17 echo.  Echo in 9/20 showed EF 60-65%, moderate LVH, normal RV, mild-moderate MR.  He has been on once daily IV Lasix in the hospital.  On exam today, he has JVD still.   - Lasix 40 mg IV bid today, possible transition to Lasix 40 mg po bid tomorrow depending on exam.  - RHC with LHC on Wednesday as above.    4. Hypertension: BP appears controlled on current meds.    5. OSA: He has not been using CPAP.  He has episodes of bradycardia at night, suspect related to OSA.  - Will have him retry CPAP while an inpatient.    6. Anemia: Suspected GI bleeding but source not yet found.  Had EGD and c-scope in 9/20.  Hgb down to 8.1 today, has low ferritin this admission.  - Transfuse hgb < 7.  - Capsule endoscopy being done today.  - Will give feraheme.   Loralie Champagne 06/29/2019 11:00 AM

## 2019-06-29 NOTE — Progress Notes (Signed)
[facility] PROGRESS NOTE Subjective Pt without symptoms. Just swallowed the SB capsule about 20 minutes ago.  Objective: Vital signs in last 24 hours: Temp:  [98.2 F (36.8 C)-99 F (37.2 C)] 98.5 F (36.9 C) (11/16 0726) Pulse Rate:  [53-63] 53 (11/16 0726) Resp:  [16-20] 16 (11/16 0726) BP: (106-125)/(56-72) 106/56 (11/16 0726) SpO2:  [99 %-100 %] 100 % (11/16 0726) Weight:  [90.7 kg] 90.7 kg (11/16 0513) Last BM Date: 06/28/19  Intake/Output from previous day: 11/15 0701 - 11/16 0700 In: 720 [P.O.:720] Out: 1650 [Urine:1650] Intake/Output this shift: No intake/output data recorded.    Lab Results: Recent Labs    06/26/19 1550 06/27/19 0013 06/27/19 0600 06/28/19 0354 06/29/19 0535  WBC 6.7 7.6 7.0 7.9 8.3  HGB 9.4* 9.7* 8.5* 8.9* 8.1*  HCT 32.8* 33.7* 29.5* 30.6* 27.9*  PLT 316 300 284 324 289   BMET Recent Labs    06/26/19 1550 06/27/19 0013 06/27/19 0600 06/28/19 0354 06/29/19 0535  NA 138 140 139 141 141  K 3.4* 3.6 3.7 3.8 3.6  CL 104 109 109 112* 112*  CO2 21* 21* 20* 20* 22  CREATININE 1.31* 1.08 0.97 1.15 1.14   LFT Recent Labs    06/27/19 0013  PROT 6.9  AST 14*  ALT 19  ALKPHOS 52  BILITOT 1.0   PT/INR No results for input(s): LABPROT, INR in the last 72 hours. PANCREAS No results for input(s): LIPASE in the last 72 hours.       Studies/Results: No results found.  Medications: I have reviewed the patient's current medications.  Assessment:   1. Occult GI bleeding. Recent EGD and Colon 2 small polyps only.   Plan: Await results of the SB capsule study. There was ? Of increased bleeding on Xarelto but less on coumadin. Will leave to cardiology   Nancy Fetter 06/29/2019, 9:46 AM  This note was created using voice recognition software. Minor errors may Have occurred unintentionally.  Pager: [phone] If no answer or after hours call [phone]

## 2019-06-30 ENCOUNTER — Encounter (HOSPITAL_COMMUNITY): Payer: Medicare Other

## 2019-06-30 ENCOUNTER — Encounter (HOSPITAL_COMMUNITY): Payer: Self-pay | Admitting: Gastroenterology

## 2019-06-30 LAB — CBC
HCT: 29.2 % — ABNORMAL LOW (ref 39.0–52.0)
Hemoglobin: 8.9 g/dL — ABNORMAL LOW (ref 13.0–17.0)
MCH: 22 pg — ABNORMAL LOW (ref 26.0–34.0)
MCHC: 30.5 g/dL (ref 30.0–36.0)
MCV: 72.3 fL — ABNORMAL LOW (ref 80.0–100.0)
Platelets: 315 10*3/uL (ref 150–400)
RBC: 4.04 MIL/uL — ABNORMAL LOW (ref 4.22–5.81)
RDW: 19.7 % — ABNORMAL HIGH (ref 11.5–15.5)
WBC: 7.6 10*3/uL (ref 4.0–10.5)
nRBC: 0.4 % — ABNORMAL HIGH (ref 0.0–0.2)

## 2019-06-30 LAB — BASIC METABOLIC PANEL
Anion gap: 11 (ref 5–15)
BUN: 15 mg/dL (ref 8–23)
CO2: 22 mmol/L (ref 22–32)
Calcium: 9.2 mg/dL (ref 8.9–10.3)
Chloride: 107 mmol/L (ref 98–111)
Creatinine, Ser: 1.13 mg/dL (ref 0.61–1.24)
GFR calc Af Amer: >60 mL/min (ref >60)
GFR calc non Af Amer: >60 mL/min (ref >60)
Glucose, Bld: 106 mg/dL — ABNORMAL HIGH (ref 70–99)
Potassium: 3.6 mmol/L (ref 3.5–5.1)
Sodium: 140 mmol/L (ref 135–145)

## 2019-06-30 LAB — LIPID PANEL
Cholesterol: 81 mg/dL (ref 0–200)
HDL: 31 mg/dL — ABNORMAL LOW (ref >40)
LDL Cholesterol: 25 mg/dL (ref 0–99)
Total CHOL/HDL Ratio: 2.6 RATIO
Triglycerides: 127 mg/dL (ref <150)
VLDL: 25 mg/dL (ref 0–40)

## 2019-06-30 LAB — GLUCOSE, CAPILLARY
Glucose-Capillary: 125 mg/dL — ABNORMAL HIGH (ref 70–99)
Glucose-Capillary: 158 mg/dL — ABNORMAL HIGH (ref 70–99)
Glucose-Capillary: 166 mg/dL — ABNORMAL HIGH (ref 70–99)
Glucose-Capillary: 112 mg/dL — ABNORMAL HIGH (ref 70–99)

## 2019-06-30 MED ORDER — POTASSIUM CHLORIDE CRYS ER 20 MEQ PO TBCR
20.0000 meq | EXTENDED_RELEASE_TABLET | Freq: Two times a day (BID) | ORAL | Status: DC
  Administered 2019-06-30 – 2019-07-01 (×2): 20 meq via ORAL
  Filled 2019-06-30 (×2): qty 1

## 2019-06-30 MED ORDER — SODIUM CHLORIDE 0.9 % IV SOLN
510.0000 mg | Freq: Once | INTRAVENOUS | Status: DC
  Filled 2019-06-30: qty 17

## 2019-06-30 MED ORDER — FUROSEMIDE 10 MG/ML IJ SOLN
40.0000 mg | Freq: Two times a day (BID) | INTRAMUSCULAR | Status: DC
  Administered 2019-06-30 – 2019-07-01 (×3): 40 mg via INTRAVENOUS
  Filled 2019-06-30 (×3): qty 4

## 2019-06-30 MED ORDER — SODIUM CHLORIDE 0.9 % IV SOLN: 510.0000 mg | Freq: Once | INTRAVENOUS | Status: DC

## 2019-06-30 MED ORDER — INSULIN ASPART 100 UNIT/ML ~~LOC~~ SOLN
0.0000 [IU] | Freq: Three times a day (TID) | SUBCUTANEOUS | Status: DC
  Administered 2019-07-01: 3 [IU] via SUBCUTANEOUS
  Administered 2019-07-02: 2 [IU] via SUBCUTANEOUS

## 2019-06-30 MED ORDER — POTASSIUM CHLORIDE CRYS ER 20 MEQ PO TBCR
40.0000 meq | EXTENDED_RELEASE_TABLET | Freq: Once | ORAL | Status: AC
  Administered 2019-06-30: 40 meq via ORAL
  Filled 2019-06-30: qty 2

## 2019-06-30 NOTE — Progress Notes (Signed)
Pt placed on CPAP for the night.  Mask secured. Pt tolerating well.

## 2019-06-30 NOTE — Progress Notes (Signed)
PROGRESS NOTE    [name] [name]  [id number] DOB: 04-Oct-1955 DOA: 06/26/2019 PCP: [name], MD  Brief Narrative: 63 year old male with history of diffuse CAD, atrial flutter on Xarelto, chronic diastolic CHF, presented to [facility] emergency room with exertional chest pain in September at that time he was noted to have drop in his hemoglobin from baseline 11 to 7-8 range, he had an EGD and colonoscopy at that time which was unremarkable, followed by Dr. Therisa Doyne with [facility], plan for capsule endoscopy next week and IV iron on Tuesday -Presented to the emergency room again with exertional chest pain, concern for progressive CAD however work-up limited by ongoing iron deficiency anemia -GI and cardiology following, underwent capsule endoscopy 11/16  Assessment & Plan:  1. Diffuse CAD, exertional chest pain -last cath 12/2016, noted far distal LAD with 90% stenosis, patent proximal and distal RCA stents -Appreciate cardiology input, situation complicated by iron deficiency anemia in the setting of Xarelto use for atrial flutter, recent GI work-up was unremarkable, remains on Xarelto-now on hold, plan for possible left and right heart cath soon -Increased Imdur dose -Stable today  2.  Iron deficiency anemia -Subacute and progressive for 2 to 3 months now, hospitalized in September for same, -EGD and colonoscopy were unremarkable -Given IV iron 11/14  [facility] gastroenterology following, capsule endoscopy completed yesterday, await results for planning of cardiac work-up  -hb 8.9 this morning  3.    Acute on chronic diastolic CHF -Last echo [id number] with preserved EF, moderate LVH mild to moderate MR -IV Lasix today -Heart failure team following, clinically improving with diuresis as well  4.  Atrial flutter -Continue carvedilol and sotalol -Holding Xarelto in anticipation of possible left heart cath -long term,  ablation will be a good option  5. type 2 diabetes  mellitus -CBGs are stable, hemoglobin A1c was 6.8 in April suggesting good control -Continue Lantus and sliding scale, recheck A1c -CBGs are stable  6. OSA -continue CPAP  7. CKD 2 -stable  DVT prophylaxis: lovenox Code Status: Full Code Family Communication: No family at bedside, discussed with daughters via telephone yesterday Disposition Plan: Home pending cardiac and GI work-up  Consultants:   Cardiology  [facility]   Procedures: Capsule endoscopy 11/16  Antimicrobials:    Subjective: -Feels better today, dyspnea is improving, denies any chest pain overnight  Objective: Vitals:   06/29/19 2257 06/30/19 0436 06/30/19 0823 06/30/19 1151  BP:  134/74 135/72 116/64  Pulse: 72 62 (!) 50 68  Resp: 18 18 16 20   Temp:  98.4 F (36.9 C)  98.4 F (36.9 C)  TempSrc:  Oral  Oral  SpO2: 95% 100% 97% 98%  Weight:  89.4 kg    Height:        Intake/Output Summary (Last 24 hours) at 06/30/2019 1430 Last data filed at 06/30/2019 1153 Gross per 24 hour  Intake 1270 ml  Output 1600 ml  Net -330 ml   Filed Weights   06/28/19 0513 06/29/19 0513 06/30/19 0436  Weight: 91.5 kg 90.7 kg 89.4 kg    Examination:  Gen: Awake, Alert, Oriented X 3, no distress HEENT: PERRLA, Neck supple,  Lungs: Few basilar rails, otherwise clear CVS: RRR,No Gallops,Rubs or new Murmurs Abd: soft, Non tender, non distended, BS present Extremities: No edema Skin: no new rashes Psychiatry: Judgement and insight appear normal. Mood & affect appropriate.     Data Reviewed:   CBC: Recent Labs  Lab 06/27/19 0013 06/27/19 0600 06/28/19 0354  06/29/19 0535 06/30/19 0722  WBC 7.6 7.0 7.9 8.3 7.6  NEUTROABS 4.8  --   --   --   --   HGB 9.7* 8.5* 8.9* 8.1* 8.9*  HCT 33.7* 29.5* 30.6* 27.9* 29.2*  MCV 74.6* 73.9* 73.7* 73.8* 72.3*  PLT 300 284 324 289 [id number]   Basic Metabolic Panel: Recent Labs  Lab 06/27/19 0013 06/27/19 0145 06/27/19 0600 06/28/19 0354 06/29/19 0535 06/30/19 0722   NA 140  --  139 141 141 140  K 3.6  --  3.7 3.8 3.6 3.6  CL 109  --  109 112* 112* 107  CO2 21*  --  20* 20* 22 22  GLUCOSE 93  --  118* 114* 115* 106*  BUN 13  --  11 14 14 15   CREATININE 1.08  --  0.97 1.15 1.14 1.13  CALCIUM 8.9  --  8.3* 8.7* 8.6* 9.2  MG  --  1.9  --   --   --   --    GFR: Estimated Creatinine Clearance: 71.5 mL/min (by C-G formula based on SCr of 1.13 mg/dL). Liver Function Tests: Recent Labs  Lab 06/27/19 0013  AST 14*  ALT 19  ALKPHOS 52  BILITOT 1.0  PROT 6.9  ALBUMIN 3.2*   No results for input(s): LIPASE, AMYLASE in the last 168 hours. No results for input(s): AMMONIA in the last 168 hours. Coagulation Profile: No results for input(s): INR, PROTIME in the last 168 hours. Cardiac Enzymes: Recent Labs  Lab 06/26/19 1824  CKTOTAL 80   BNP (last 3 results) No results for input(s): PROBNP in the last 8760 hours. HbA1C: No results for input(s): HGBA1C in the last 72 hours. CBG: Recent Labs  Lab 06/29/19 1136 06/29/19 1646 06/29/19 2103 06/30/19 0559 06/30/19 1152  GLUCAP 101* 117* 130* 125* 158*   Lipid Profile: Recent Labs    06/30/19 0011  CHOL 81  HDL 31*  LDLCALC 25  TRIG 127  CHOLHDL 2.6   Thyroid Function Tests: No results for input(s): TSH, T4TOTAL, FREET4, T3FREE, THYROIDAB in the last 72 hours. Anemia Panel: No results for input(s): VITAMINB12, FOLATE, FERRITIN, TIBC, IRON, RETICCTPCT in the last 72 hours. Urine analysis:    Component Value Date/Time   COLORURINE YELLOW 03/23/2019 [place] 03/23/2019 1343   LABSPEC 1.025 03/23/2019 1343   PHURINE 5.5 03/23/2019 1343   GLUCOSEU NEGATIVE 03/23/2019 1343   HGBUR NEGATIVE 03/23/2019 1343   HGBUR moderate 09/27/2010 1646   BILIRUBINUR NEGATIVE 03/23/2019 1343   BILIRUBINUR small (A) 03/13/2019 1620   BILIRUBINUR neg 05/18/2014 1239   KETONESUR NEGATIVE 03/23/2019 1343   PROTEINUR >=300 (A) 03/13/2019 1620   PROTEINUR 30 05/18/2014 1239   PROTEINUR  NEGATIVE 11/08/2013 1042   UROBILINOGEN 0.2 03/23/2019 1343   NITRITE NEGATIVE 03/23/2019 1343   LEUKOCYTESUR NEGATIVE 03/23/2019 1343   Sepsis Labs: @LABRCNTIP (procalcitonin:4,lacticidven:4)  ) Recent Results (from the past 240 hour(s))  SARS CORONAVIRUS 2 (TAT 6-24 HRS) Nasopharyngeal Nasopharyngeal Swab     Status: None   Collection Time: 06/26/19 11:28 PM   Specimen: Nasopharyngeal Swab  Result Value Ref Range Status   SARS Coronavirus 2 NEGATIVE NEGATIVE Final    Comment: (NOTE) SARS-CoV-2 target nucleic acids are NOT DETECTED. The SARS-CoV-2 RNA is generally detectable in upper and lower respiratory specimens during the acute phase of infection. Negative results do not preclude SARS-CoV-2 infection, do not rule out co-infections with other pathogens, and should not be used as the sole basis for  treatment or other patient management decisions. Negative results must be combined with clinical observations, patient history, and epidemiological information. The expected result is Negative. Fact Sheet for Patients: SugarRoll.be Fact Sheet for Healthcare Providers: https://www.woods-mathews.com/ This test is not yet approved or cleared by the [place] FDA and  has been authorized for detection and/or diagnosis of SARS-CoV-2 by FDA under an Emergency Use Authorization (EUA). This EUA will remain  in effect (meaning this test can be used) for the duration of the COVID-19 declaration under Section 56 4(b)(1) of the Act, 21 U.S.C. section 360bbb-3(b)(1), unless the authorization is terminated or revoked sooner. Performed at [facility], [place] [address] [place], [place] [address]   Blood Culture (routine x 2)     Status: None (Preliminary result)   Collection Time: 06/27/19  1:00 AM   Specimen: BLOOD  Result Value Ref Range Status   Specimen Description BLOOD LEFT ANTECUBITAL  Final   Special Requests   Final    BOTTLES DRAWN  AEROBIC AND ANAEROBIC Blood Culture results may not be optimal due to an inadequate volume of blood received in culture bottles   Culture   Final    NO GROWTH 3 DAYS Performed at [facility], [place] [address], [place], [place] [address]    Report Status PENDING  Incomplete  Blood Culture (routine x 2)     Status: None (Preliminary result)   Collection Time: 06/27/19  1:07 AM   Specimen: Site Not Specified; Blood  Result Value Ref Range Status   Specimen Description SITE NOT SPECIFIED  Final   Special Requests   Final    AEROBIC BOTTLE ONLY Blood Culture results may not be optimal due to an inadequate volume of blood received in culture bottles   Culture   Final    NO GROWTH 3 DAYS Performed at [facility], [place] [address] [place], [place] [address]    Report Status PENDING  Incomplete         Radiology Studies: No results found.      Scheduled Meds: . amLODipine  5 mg Oral Daily  . atorvastatin  80 mg Oral Daily  . carvedilol  25 mg Oral BID WC  . enoxaparin (LOVENOX) injection  40 mg Subcutaneous Q24H  . furosemide  40 mg Intravenous BID  . hydrALAZINE  75 mg Oral BID  . insulin aspart  0-15 Units Subcutaneous TID WC  . insulin glargine  35 Units Subcutaneous QPC breakfast  . isosorbide mononitrate  90 mg Oral Daily  . pantoprazole  40 mg Oral Daily  . potassium chloride  20 mEq Oral BID  . sotalol  120 mg Oral Q12H   Continuous Infusions:   LOS: 2 days    Time spent: 37min    Domenic Polite, MD [facility] Page via [email or web address], password [email or web address] After 7PM please contact night-coverage  06/30/2019, 2:30 PM

## 2019-06-30 NOTE — Procedures (Signed)
Small bowel PillCam was performed as patient has severe symptomatic iron deficiency anemia.  He had an unremarkable EGD in 04/2019 and colonoscopy from 04/2019 showed diverticulosis and internal hemorrhoids, tubular adenomas were removed.  He is on Xarelto for atrial flutter.  Procedure information and findings: After obtaining informed consent, the patient was advised to swallow a PillCam.  It was noted in the duodenum at 15 minutes and at the end of the study at 8 hours and 48 minutes was still noted in the small bowel.  There was no evidence of active or recent small bowel bleeding. An area of prominent blood vessel was noted in the small bowel not typical of AVM. There was liquid and semiformed stool in distal small bowel obscuring visualization in certain areas.   Summary and recommendations: No small bowel bleeding noted. The PillCam was noted in distal small bowel at the conclusion of the study at 8 hours and 48 minutes. We will get an abdominal x-ray in a.m. to ensure that PillCam has exited into the colon. Will give patient 1 dose of IV Feraheme 510 mg. Recommend periodic iron panel and ferritin monitoring and IV iron infusion as needed.  Okay to resume on regular diet.  Ronnette Juniper, MD

## 2019-06-30 NOTE — Progress Notes (Addendum)
Progress Note  Patient Name: [name] [name] Date of Encounter: 06/30/2019  Primary Cardiologist: Loralie Champagne, MD   Subjective   Yesterday diuresed with IV lasix. Weight down another couple of pounds.   Capsule Endoscopy results pending.   Able to tolerate CPAP.   Mild SOB. Had BM last night. Says he had some blood on the toilet paper and small amount on in the commode. No chest pain today.   Inpatient Medications    Scheduled Meds:  amLODipine  5 mg Oral Daily   atorvastatin  80 mg Oral Daily   carvedilol  25 mg Oral BID WC   enoxaparin (LOVENOX) injection  40 mg Subcutaneous Q24H   hydrALAZINE  75 mg Oral BID   insulin aspart  0-15 Units Subcutaneous TID WC   insulin glargine  35 Units Subcutaneous QPC breakfast   isosorbide mononitrate  90 mg Oral Daily   pantoprazole  40 mg Oral Daily   sotalol  120 mg Oral Q12H   Continuous Infusions:  PRN Meds: acetaminophen, nitroGLYCERIN   Vital Signs    Vitals:   06/29/19 1939 06/29/19 2156 06/29/19 2257 06/30/19 0436  BP: 125/68 130/70  134/74  Pulse: 60 60 72 62  Resp: 20  18 18   Temp: 98.1 F (36.7 C)   98.4 F (36.9 C)  TempSrc: Oral   Oral  SpO2: 99%  95% 100%  Weight:    89.4 kg  Height:        Intake/Output Summary (Last 24 hours) at 06/30/2019 0756 Last data filed at 06/29/2019 2200 Gross per 24 hour  Intake 720 ml  Output 1650 ml  Net -930 ml   Filed Weights   06/28/19 0513 06/29/19 0513 06/30/19 0436  Weight: 91.5 kg 90.7 kg 89.4 kg    Telemetry    A flutter  ECG    No new ECG- Personally Reviewed  Physical Exam  General:  NAD  No resp difficulty HEENT: normal Neck: supple. no JVD. Carotids 2+ bilat; no bruits. No lymphadenopathy or thryomegaly appreciated. Cor: PMI nondisplaced. Regular rate & rhythm. No rubs, gallops or murmurs. Lungs: clear on room air.  Abdomen: soft, nontender, nondistended. No hepatosplenomegaly. No bruits or masses. Good bowel  sounds. Extremities: no cyanosis, clubbing, rash, edema Neuro: alert & orientedx3, cranial nerves grossly intact. moves all 4 extremities w/o difficulty. Affect pleasant   Labs    Chemistry Recent Labs  Lab 06/27/19 0013 06/27/19 0600 06/28/19 0354 06/29/19 0535  NA 140 139 141 141  K 3.6 3.7 3.8 3.6  CL 109 109 112* 112*  CO2 21* 20* 20* 22  GLUCOSE 93 118* 114* 115*  BUN 13 11 14 14   CREATININE 1.08 0.97 1.15 1.14  CALCIUM 8.9 8.3* 8.7* 8.6*  PROT 6.9  --   --   --   ALBUMIN 3.2*  --   --   --   AST 14*  --   --   --   ALT 19  --   --   --   ALKPHOS 52  --   --   --   BILITOT 1.0  --   --   --   GFRNONAA >60 >60 >60 >60  GFRAA >60 >60 >60 >60  ANIONGAP 10 10 9 7      Hematology Recent Labs  Lab 06/28/19 0354 06/29/19 0535 06/30/19 0722  WBC 7.9 8.3 7.6  RBC 4.15* 3.78* 4.04*  HGB 8.9* 8.1* 8.9*  HCT 30.6* 27.9* 29.2*  MCV  73.7* 73.8* 72.3*  MCH 21.4* 21.4* 22.0*  MCHC 29.1* 29.0* 30.5  RDW 18.7* 19.0* 19.7*  PLT 324 289 315    Cardiac EnzymesNo results for input(s): TROPONINI in the last 168 hours. No results for input(s): TROPIPOC in the last 168 hours.   BNP Recent Labs  Lab 06/26/19 2215  BNP 290.8*     DDimer  Recent Labs  Lab 06/26/19 2215 06/27/19 0013  DDIMER <0.27 0.32     Radiology    No results found.  Cardiac Studies   None this admission  Patient Profile     63yo male seen in HF clinic with PMH HFrEF with resolution, diastolic dysfunction, atrial flutter, HTN, and chronic anemia who presented with exertional angina and SOB at rest.   Assessment & Plan    1. Atrial fibrillation/flutter: History of paroxysmal atrial fibrillation, had been on Xarelto and sotalol.  This admission, he was noted to be in rate-controlled atrial flutter.   Remains in A flutter. Rate controlled.   - Xarelto stopped with anemia/suspected GI bleeding.  Will eventually need to restart anticoagulation, think that Eliquis may be better choice as it  seems to cause less GI bleeding.  - Continue sotalol for now, QTc has been ok. Avoid QT-prolonging meds.  - Will need to cardiovert him to NSR eventually.  Awaiting result of capsule endoscopy, then will likely arrange for coronary angiography with worsening exertion CP (?Wednesday after we get back capsule result on Tuesday).  After that, will need to restart anticoagulation and aim for DCCV after 3-4 weeks of therapeutic anticoagulation.  - Consider atrial flutter ablation down the road.  2. Coronary artery disease: LHC in 9/16 showed diffuse CAD, most significant involving the distal RCA. Patient had DES x 2 to RCA. Cath in 3/17 with patent RCA stents and diffuse distal and branch vessel disease concerning for poorly controlled diabetes. Cath in 5/18 again showed diffuse CAD without good interventional target. He has had worsening exertional chest pain over the last couple of months, now reports mild tightness in his chest just walking around the house. This could be due to his underlying diffuse distal/branch vessel disease + the added stress of significant anemia (no progression of CAD, no interventional option) or he could have progressive CAD with possible interventional options.  - Anticoagulation is on hold.  After capsule endoscopy, if no GI intervention is needed and hgb has stabilized, will plan left/right cath on probably Wednesday. Discussed risks/benefits with patient, he agrees to procedure.  - Continue atorvastatin 80 mg daily, check lipids. - Continue  Imdur to 90 mg daily.  - Avoid ranolazine with sotalol use.  3. Acute on chronic diastolic CHF: EF [id number] on cardiac MRI in 6/14 but EF improved back to 55-60% on 2/16 echo. However, EF down to 26% on Cardiolite in 9/16. I think this was inaccurate. EF was 55-60% by both LV-gram and echo in 9/16 and 55-60% on 6/17 echo. Echo in 9/20 showed EF 60-65%, moderate LVH, normal RV, mild-moderate MR.   Volume status stable. Stop IV lasix.  Start lasix 40 mg twice a day .  - RHC with LHC on Wednesday as above.  4. Hypertension: BP appears controlled on current meds.  5. OSA: Continue CPAP.  He has episodes of bradycardia at night, suspect related to OSA.  6. Anemia: Suspected GI bleeding but source not yet found.  Had EGD and c-scope in 9/20.  Hgb up to 8.9  today, had feraheme this admission. -  Transfuse hgb < 7.  - Capsule endoscopy completed.    Jeanmarie Hubert, NP  06/30/2019, 7:56 AM    Patient seen with NP, agree with the above note .  Hgb up to 8.9 today, noted small amount of blood around stool last night (?hemorrhoidal bleeding).  Had capsule endoscopy.   Still short of breath walking around in room/hall.  Weight down, diuresed well yesterday per patient.  Creatinine stable.  No chest pain.   General: NAD Neck: JVP 9-10 cm, no thyromegaly or thyroid nodule.  Lungs: Clear to auscultation bilaterally with normal respiratory effort. CV: Nondisplaced PMI.  Heart regular S1/S2, no S3/S4, no murmur.  No peripheral edema.  Abdomen: Soft, nontender, no hepatosplenomegaly, no distention.  Skin: Intact without lesions or rashes.  Neurologic: Alert and oriented x 3.  Psych: Normal affect. Extremities: No clubbing or cyanosis.  HEENT: Normal.   Still short of breath with some evidence for volume overload, wound continue the IV Lasix 40 mg bid today. RHC tomorrow hopefully with coronary angiography.   Hgb higher today.  Await GI report of capsule endoscopy.  If no further GI procedures and GI thinks he is ok for potential antiplatelets/anticoagulation, will plan RHC/coronary angiography tomorrow morning as discussed above.   After cath, will need to restart anticoagulation.  With suspected GI bleeding, would use Eliquis rather than Xarelto.  He will need eventual DCCV of atrial flutter, would do after 3-4 weeks therapeutic anticoagulation.  He can continue sotalol.   Loralie Champagne 06/30/2019 8:49 AM

## 2019-06-30 NOTE — Plan of Care (Signed)
  Problem: Clinical Measurements: Goal: Cardiovascular complication will be avoided Outcome: Progressing   

## 2019-07-01 ENCOUNTER — Encounter (HOSPITAL_COMMUNITY)
Admission: EM | Disposition: A | Discharge status: Home / Self Care | Payer: Self-pay | Source: Home / Self Care | Attending: Internal Medicine

## 2019-07-01 ENCOUNTER — Inpatient Hospital Stay (HOSPITAL_COMMUNITY): Payer: Medicare Other

## 2019-07-01 DIAGNOSIS — R531 Weakness: Secondary | ICD-10-CM

## 2019-07-01 DIAGNOSIS — I2510 Atherosclerotic heart disease of native coronary artery without angina pectoris: Secondary | ICD-10-CM

## 2019-07-01 DIAGNOSIS — I509 Heart failure, unspecified: Secondary | ICD-10-CM

## 2019-07-01 HISTORY — PX: RIGHT/LEFT HEART CATH AND CORONARY ANGIOGRAPHY: CATH118266

## 2019-07-01 LAB — POCT I-STAT EG7
Acid-base deficit: 1 mmol/L (ref 0.0–2.0)
Bicarbonate: 23.8 mmol/L (ref 20.0–28.0)
Bicarbonate: 24.5 mmol/L (ref 20.0–28.0)
Calcium, Ion: 1.23 mmol/L (ref 1.15–1.40)
Calcium, Ion: 1.27 mmol/L (ref 1.15–1.40)
HCT: 30 % — ABNORMAL LOW (ref 39.0–52.0)
HCT: 31 % — ABNORMAL LOW (ref 39.0–52.0)
Hemoglobin: 10.2 g/dL — ABNORMAL LOW (ref 13.0–17.0)
Hemoglobin: 10.5 g/dL — ABNORMAL LOW (ref 13.0–17.0)
O2 Saturation: 65 %
O2 Saturation: 67 %
Potassium: 3.7 mmol/L (ref 3.5–5.1)
Potassium: 3.8 mmol/L (ref 3.5–5.1)
Sodium: 142 mmol/L (ref 135–145)
Sodium: 142 mmol/L (ref 135–145)
TCO2: 25 mmol/L (ref 22–32)
TCO2: 26 mmol/L (ref 22–32)
pCO2, Ven: 38.3 mmHg — ABNORMAL LOW (ref 44.0–60.0)
pCO2, Ven: 38.9 mmHg — ABNORMAL LOW (ref 44.0–60.0)
pH, Ven: 7.401 (ref 7.250–7.430)
pH, Ven: 7.407 (ref 7.250–7.430)
pO2, Ven: 33 mmHg (ref 32.0–45.0)
pO2, Ven: 35 mmHg (ref 32.0–45.0)

## 2019-07-01 LAB — CBC
HCT: 30.3 % — ABNORMAL LOW (ref 39.0–52.0)
Hemoglobin: 8.9 g/dL — ABNORMAL LOW (ref 13.0–17.0)
MCH: 21.5 pg — ABNORMAL LOW (ref 26.0–34.0)
MCHC: 29.4 g/dL — ABNORMAL LOW (ref 30.0–36.0)
MCV: 73.2 fL — ABNORMAL LOW (ref 80.0–100.0)
Platelets: 291 10*3/uL (ref 150–400)
RBC: 4.14 MIL/uL — ABNORMAL LOW (ref 4.22–5.81)
RDW: 20.5 % — ABNORMAL HIGH (ref 11.5–15.5)
WBC: 8.4 10*3/uL (ref 4.0–10.5)
nRBC: 0.4 % — ABNORMAL HIGH (ref 0.0–0.2)

## 2019-07-01 LAB — BASIC METABOLIC PANEL
Anion gap: 12 (ref 5–15)
BUN: 17 mg/dL (ref 8–23)
CO2: 21 mmol/L — ABNORMAL LOW (ref 22–32)
Calcium: 9.1 mg/dL (ref 8.9–10.3)
Chloride: 107 mmol/L (ref 98–111)
Creatinine, Ser: 1.17 mg/dL (ref 0.61–1.24)
GFR calc Af Amer: >60 mL/min (ref >60)
GFR calc non Af Amer: >60 mL/min (ref >60)
Glucose, Bld: 101 mg/dL — ABNORMAL HIGH (ref 70–99)
Potassium: 3.9 mmol/L (ref 3.5–5.1)
Sodium: 140 mmol/L (ref 135–145)

## 2019-07-01 LAB — GLUCOSE, CAPILLARY
Glucose-Capillary: 102 mg/dL — ABNORMAL HIGH (ref 70–99)
Glucose-Capillary: 93 mg/dL (ref 70–99)
Glucose-Capillary: 151 mg/dL — ABNORMAL HIGH (ref 70–99)
Glucose-Capillary: 108 mg/dL — ABNORMAL HIGH (ref 70–99)

## 2019-07-01 SURGERY — RIGHT/LEFT HEART CATH AND CORONARY ANGIOGRAPHY
Anesthesia: LOCAL

## 2019-07-01 MED ORDER — SODIUM CHLORIDE 0.9% FLUSH
3.0000 mL | Freq: Two times a day (BID) | INTRAVENOUS | Status: DC
  Administered 2019-07-01: 3 mL via INTRAVENOUS

## 2019-07-01 MED ORDER — MIDAZOLAM HCL 2 MG/2ML IJ SOLN
INTRAMUSCULAR | Status: AC
  Filled 2019-07-01: qty 2

## 2019-07-01 MED ORDER — VERAPAMIL HCL 2.5 MG/ML IV SOLN
INTRAVENOUS | Status: DC | PRN
  Administered 2019-07-01: 10 mL via INTRA_ARTERIAL

## 2019-07-01 MED ORDER — LABETALOL HCL 5 MG/ML IV SOLN: 10.0000 mg | INTRAVENOUS | Status: AC | PRN

## 2019-07-01 MED ORDER — SODIUM CHLORIDE 0.9 % IV SOLN
INTRAVENOUS | Status: DC
  Administered 2019-07-01: 06:00:00 via INTRAVENOUS

## 2019-07-01 MED ORDER — LIDOCAINE HCL (PF) 1 % IJ SOLN
INTRAMUSCULAR | Status: AC
  Filled 2019-07-01: qty 30

## 2019-07-01 MED ORDER — LIDOCAINE HCL (PF) 1 % IJ SOLN
INTRAMUSCULAR | Status: DC | PRN
  Administered 2019-07-01 (×2): 2 mL

## 2019-07-01 MED ORDER — HYDRALAZINE HCL 20 MG/ML IJ SOLN: 10.0000 mg | INTRAMUSCULAR | Status: AC | PRN

## 2019-07-01 MED ORDER — HEPARIN SODIUM (PORCINE) 1000 UNIT/ML IJ SOLN
INTRAMUSCULAR | Status: DC | PRN
  Administered 2019-07-01: 4500 [IU] via INTRAVENOUS

## 2019-07-01 MED ORDER — HEPARIN (PORCINE) IN NACL 1000-0.9 UT/500ML-% IV SOLN
INTRAVENOUS | Status: DC | PRN
  Administered 2019-07-01 (×2): 500 mL

## 2019-07-01 MED ORDER — ISOSORBIDE MONONITRATE ER 60 MG PO TB24
120.0000 mg | ORAL_TABLET | Freq: Every day | ORAL | Status: DC
  Administered 2019-07-02: 120 mg via ORAL
  Filled 2019-07-01: qty 2

## 2019-07-01 MED ORDER — MIDAZOLAM HCL 2 MG/2ML IJ SOLN
INTRAMUSCULAR | Status: DC | PRN
  Administered 2019-07-01: 1 mg via INTRAVENOUS

## 2019-07-01 MED ORDER — SODIUM CHLORIDE 0.9% FLUSH: 3.0000 mL | INTRAVENOUS | Status: DC | PRN

## 2019-07-01 MED ORDER — FENTANYL CITRATE (PF) 100 MCG/2ML IJ SOLN
INTRAMUSCULAR | Status: DC | PRN
  Administered 2019-07-01: 25 ug via INTRAVENOUS

## 2019-07-01 MED ORDER — ACETAMINOPHEN 325 MG PO TABS: 650.0000 mg | ORAL_TABLET | ORAL | Status: DC | PRN

## 2019-07-01 MED ORDER — HEPARIN SODIUM (PORCINE) 1000 UNIT/ML IJ SOLN
INTRAMUSCULAR | Status: AC
  Filled 2019-07-01: qty 1

## 2019-07-01 MED ORDER — HEPARIN (PORCINE) IN NACL 1000-0.9 UT/500ML-% IV SOLN
INTRAVENOUS | Status: AC
  Filled 2019-07-01: qty 1000

## 2019-07-01 MED ORDER — SODIUM CHLORIDE 0.9 % IV SOLN: 250.0000 mL | INTRAVENOUS | Status: DC | PRN

## 2019-07-01 MED ORDER — VERAPAMIL HCL 2.5 MG/ML IV SOLN
INTRAVENOUS | Status: AC
  Filled 2019-07-01: qty 2

## 2019-07-01 MED ORDER — SODIUM CHLORIDE 0.9 % WEIGHT BASED INFUSION
1.0000 mL/kg/h | INTRAVENOUS | Status: AC
  Administered 2019-07-01: 1 mL/kg/h via INTRAVENOUS

## 2019-07-01 MED ORDER — IOHEXOL 350 MG/ML SOLN
INTRAVENOUS | Status: DC | PRN
  Administered 2019-07-01: 90 mL

## 2019-07-01 MED ORDER — ASPIRIN 81 MG PO CHEW
81.0000 mg | CHEWABLE_TABLET | ORAL | Status: AC
  Administered 2019-07-01: 81 mg via ORAL
  Filled 2019-07-01: qty 1

## 2019-07-01 MED ORDER — FENTANYL CITRATE (PF) 100 MCG/2ML IJ SOLN
INTRAMUSCULAR | Status: AC
  Filled 2019-07-01: qty 2

## 2019-07-01 MED ORDER — ONDANSETRON HCL 4 MG/2ML IJ SOLN: 4.0000 mg | Freq: Four times a day (QID) | INTRAMUSCULAR | Status: DC | PRN

## 2019-07-01 MED ORDER — SODIUM CHLORIDE 0.9% FLUSH
3.0000 mL | Freq: Two times a day (BID) | INTRAVENOUS | Status: DC
  Administered 2019-07-02 (×2): 3 mL via INTRAVENOUS

## 2019-07-01 MED ORDER — APIXABAN 5 MG PO TABS
5.0000 mg | ORAL_TABLET | Freq: Two times a day (BID) | ORAL | Status: DC
  Administered 2019-07-02: 5 mg via ORAL
  Filled 2019-07-01 (×2): qty 1

## 2019-07-01 SURGICAL SUPPLY — 13 items
CATH 5FR JL3.5 JR4 ANG PIG MP (CATHETERS) ×1 IMPLANT
CATH BALLN WEDGE 5F 110CM (CATHETERS) ×1 IMPLANT
CATH INFINITI 5 FR 3DRC (CATHETERS) ×1 IMPLANT
CATH LAUNCHER 5F EBU3.5 (CATHETERS) ×1 IMPLANT
DEVICE RAD COMP TR BAND LRG (VASCULAR PRODUCTS) ×1 IMPLANT
GLIDESHEATH SLEND SS 6F .021 (SHEATH) ×1 IMPLANT
GUIDEWIRE INQWIRE 1.5J.035X260 (WIRE) IMPLANT
INQWIRE 1.5J .035X260CM (WIRE) ×2
KIT HEART LEFT (KITS) ×2 IMPLANT
PACK CARDIAC CATHETERIZATION (CUSTOM PROCEDURE TRAY) ×2 IMPLANT
SHEATH GLIDE SLENDER 4/5FR (SHEATH) ×1 IMPLANT
TRANSDUCER W/STOPCOCK (MISCELLANEOUS) ×2 IMPLANT
WIRE MICROINTRODUCER 60CM (WIRE) ×2 IMPLANT

## 2019-07-01 NOTE — Progress Notes (Signed)
Pt stated he doesn't want to wear CPAP tonight.  Pt advised to have RN call RT if he changes his mind.

## 2019-07-01 NOTE — Progress Notes (Signed)
PROGRESS NOTE    [name] [name]  [id number] DOB: 1956-02-12 DOA: 06/26/2019 PCP: [name], MD    Brief Narrative:  63 year old male with history of diffuse CAD, atrial flutter on Xarelto, chronic diastolic CHF,presented to [facility] emergency room with exertional chest pain in September at that time he was noted to have drop in his hemoglobin from baseline 11 to 7-8 range,he had an EGD and colonoscopy at that time which was unremarkable,followed by Dr. Therisa Doyne with [facility], plan for capsule endoscopy next week and IV iron on Tuesday -Presented to the emergency room again with exertional chest pain, concern for progressive CAD however work-up limited by ongoing iron deficiency anemia -GI and cardiology following, underwent capsule endoscopy 11/16  Assessment & Plan:   Principal Problem:   Acute on chronic congestive heart failure with left ventricular diastolic dysfunction ([place]) Active Problems:   HYPERCHOLESTEROLEMIA   GOUT   NICM (nonischemic cardiomyopathy) ([place])   Obstructive sleep apnea   Essential hypertension   Chronic anticoagulation   Atrial flutter with controlled response ([facility])   CKD (chronic kidney disease), stage III   Type 2 diabetes mellitus without complication ([facility])   Iron deficiency anemia due to chronic blood loss   Generalized weakness  1. Diffuse CAD, exertional chest pain -last cath 12/2016,noted far distal LAD with 90% stenosis, patent proximal and distal RCA stents -Appreciate cardiology input, situation complicated by iron deficiency anemia in the setting of Xarelto use for atrial flutter, recent GI work-up was unremarkable -Xarelto had been on hold for heart cath. Now on eliquis -Pt now s/p heart cath, reviewed. Findings of extensive heart disease, however not amenable to CABG -Now on increased Imdur dose -Currently stable  2.Iron deficiency anemia -Subacute and progressive for 2 to 3 months now,hospitalized in September for  same, -EGD and colonoscopy were unremarkable -Given IV iron 11/14  [facility] gastroenterology following, capsule endoscopy completed, reviewed and is unremarkable -remains stable  3.  Acute on chronic diastolic CHF -Last echo [id number] with preserved EF, moderate LVH mild to moderate MR -Heart failure team following, clinically improving with diuresis as well -Seems stable  4.Atrial flutter -Continue carvedilol and sotalol as tolerated -Held NOAC prior to heart cath, eliquis started -long term, ablation will be a good option  5. type2 diabetes mellitus -CBGs are stable,hemoglobin A1c was 6.8 in April suggesting good control -Continue Lantus and sliding scale, recheck A1c -CBGs remains stable  6. OSA -continue CPAP  7. CKD 2 -remains stable  -Repeat bmet in AM  DVT prophylaxis: Eliquis Code Status: Full Family Communication: Pt in room, family in room Disposition Plan: Uncertain at this time  Consultants:   Cardiology  GI  Procedures:   Capsule endoscopy 11/16  Heart cath 11/18  Antimicrobials: Anti-infectives (From admission, onward)   None       Subjective: Without complaints at this time  Objective: Vitals:   07/01/19 1500 07/01/19 1515 07/01/19 1530 07/01/19 1600  BP: 111/62 105/65 135/73 126/67  Pulse: (!) 58 (!) 56 (!) 55 (!) 57  Resp:      Temp:      TempSrc:      SpO2: 97%     Weight:      Height:        Intake/Output Summary (Last 24 hours) at 07/01/2019 1703 Last data filed at 07/01/2019 1511 Gross per 24 hour  Intake 1239 ml  Output 1825 ml  Net -586 ml   Filed Weights   06/29/19 0513 06/30/19 0436  07/01/19 0222  Weight: 90.7 kg 89.4 kg 89.2 kg    Examination:  General exam: Appears calm and comfortable  Respiratory system: Clear to auscultation. Respiratory effort normal. Cardiovascular system: S1 & S2 heard, regular Gastrointestinal system: Abdomen is nondistended, soft and nontender. No organomegaly or masses felt.  Normal bowel sounds heard. Central nervous system: Alert and oriented. No focal neurological deficits. Extremities: Symmetric 5 x 5 power. Skin: No rashes, lesions Psychiatry: Judgement and insight appear normal. Mood & affect appropriate.   Data Reviewed: I have personally reviewed following labs and imaging studies  CBC: Recent Labs  Lab 06/27/19 0013 06/27/19 0600 06/28/19 0354 06/29/19 0535 06/30/19 0722 07/01/19 0452  WBC 7.6 7.0 7.9 8.3 7.6 8.4  NEUTROABS 4.8  --   --   --   --   --   HGB 9.7* 8.5* 8.9* 8.1* 8.9* 8.9*  HCT 33.7* 29.5* 30.6* 27.9* 29.2* 30.3*  MCV 74.6* 73.9* 73.7* 73.8* 72.3* 73.2*  PLT 300 284 324 289 315 [id number]   Basic Metabolic Panel: Recent Labs  Lab 06/27/19 0145 06/27/19 0600 06/28/19 0354 06/29/19 0535 06/30/19 0722 07/01/19 0452  NA  --  139 141 141 140 140  K  --  3.7 3.8 3.6 3.6 3.9  CL  --  109 112* 112* 107 107  CO2  --  20* 20* 22 22 21*  GLUCOSE  --  118* 114* 115* 106* 101*  BUN  --  11 14 14 15 17   CREATININE  --  0.97 1.15 1.14 1.13 1.17  CALCIUM  --  8.3* 8.7* 8.6* 9.2 9.1  MG 1.9  --   --   --   --   --    GFR: Estimated Creatinine Clearance: 69.1 mL/min (by C-G formula based on SCr of 1.17 mg/dL). Liver Function Tests: Recent Labs  Lab 06/27/19 0013  AST 14*  ALT 19  ALKPHOS 52  BILITOT 1.0  PROT 6.9  ALBUMIN 3.2*   No results for input(s): LIPASE, AMYLASE in the last 168 hours. No results for input(s): AMMONIA in the last 168 hours. Coagulation Profile: No results for input(s): INR, PROTIME in the last 168 hours. Cardiac Enzymes: Recent Labs  Lab 06/26/19 1824  CKTOTAL 80   BNP (last 3 results) No results for input(s): PROBNP in the last 8760 hours. HbA1C: No results for input(s): HGBA1C in the last 72 hours. CBG: Recent Labs  Lab 06/30/19 1606 06/30/19 2120 07/01/19 0608 07/01/19 1155 07/01/19 1620  GLUCAP 166* 112* 102* 93 151*   Lipid Profile: Recent Labs    06/30/19 0011  CHOL 81  HDL 31*   LDLCALC 25  TRIG 127  CHOLHDL 2.6   Thyroid Function Tests: No results for input(s): TSH, T4TOTAL, FREET4, T3FREE, THYROIDAB in the last 72 hours. Anemia Panel: No results for input(s): VITAMINB12, FOLATE, FERRITIN, TIBC, IRON, RETICCTPCT in the last 72 hours. Sepsis Labs: Recent Labs  Lab 06/27/19 0013 06/27/19 0213  PROCALCITON <0.10  --   LATICACIDVEN 1.1 1.8    Recent Results (from the past 240 hour(s))  SARS CORONAVIRUS 2 (TAT 6-24 HRS) Nasopharyngeal Nasopharyngeal Swab     Status: None   Collection Time: 06/26/19 11:28 PM   Specimen: Nasopharyngeal Swab  Result Value Ref Range Status   SARS Coronavirus 2 NEGATIVE NEGATIVE Final    Comment: (NOTE) SARS-CoV-2 target nucleic acids are NOT DETECTED. The SARS-CoV-2 RNA is generally detectable in upper and lower respiratory specimens during the acute phase of infection. Negative results  do not preclude SARS-CoV-2 infection, do not rule out co-infections with other pathogens, and should not be used as the sole basis for treatment or other patient management decisions. Negative results must be combined with clinical observations, patient history, and epidemiological information. The expected result is Negative. Fact Sheet for Patients: SugarRoll.be Fact Sheet for Healthcare Providers: https://www.woods-mathews.com/ This test is not yet approved or cleared by the [place] FDA and  has been authorized for detection and/or diagnosis of SARS-CoV-2 by FDA under an Emergency Use Authorization (EUA). This EUA will remain  in effect (meaning this test can be used) for the duration of the COVID-19 declaration under Section 56 4(b)(1) of the Act, 21 U.S.C. section 360bbb-3(b)(1), unless the authorization is terminated or revoked sooner. Performed at [facility], [place] [address] [place], [place] [address]   Blood Culture (routine x 2)     Status: None (Preliminary result)    Collection Time: 06/27/19  1:00 AM   Specimen: BLOOD  Result Value Ref Range Status   Specimen Description BLOOD LEFT ANTECUBITAL  Final   Special Requests   Final    BOTTLES DRAWN AEROBIC AND ANAEROBIC Blood Culture results may not be optimal due to an inadequate volume of blood received in culture bottles   Culture   Final    NO GROWTH 4 DAYS Performed at [facility], [place] [address] [place], [place] [address]    Report Status PENDING  Incomplete  Blood Culture (routine x 2)     Status: None (Preliminary result)   Collection Time: 06/27/19  1:07 AM   Specimen: Site Not Specified; Blood  Result Value Ref Range Status   Specimen Description SITE NOT SPECIFIED  Final   Special Requests   Final    AEROBIC BOTTLE ONLY Blood Culture results may not be optimal due to an inadequate volume of blood received in culture bottles   Culture   Final    NO GROWTH 4 DAYS Performed at [facility], [place] [address] [place],  [address]    Report Status PENDING  Incomplete     Radiology Studies: Dg Abd 1 View  Result Date: 07/01/2019 CLINICAL DATA:  Patient had capsule endoscopy yesterday, PillCam was noted in the distal small bowel at completion of the study. Ensure the PillCam has exited the colon. EXAM: ABDOMEN - 1 VIEW COMPARISON:  CT 627 FINDINGS: No radiopaque foreign body or PillCam visualized. No bowel dilatation to suggest obstruction. Small volume of stool in the colon. Cholecystectomy clips in the right upper quadrant. Cardiomegaly. Chronic calcification adjacent to the left superior ramus. IMPRESSION: 1. No radiopaque foreign body or PillCam visualized. 2. Normal bowel gas pattern. Electronically Signed   By: Keith Rake M.D.   On: 07/01/2019 06:37    Scheduled Meds: . amLODipine  5 mg Oral Daily  . [START ON 07/02/2019] apixaban  5 mg Oral BID  . atorvastatin  80 mg Oral Daily  . carvedilol  25 mg Oral BID WC  . hydrALAZINE  75 mg Oral BID  . insulin aspart   0-15 Units Subcutaneous TID WC  . insulin glargine  35 Units Subcutaneous QPC breakfast  . [START ON 07/02/2019] isosorbide mononitrate  120 mg Oral Daily  . pantoprazole  40 mg Oral Daily  . [START ON 07/02/2019] sodium chloride flush  3 mL Intravenous Q12H  . sotalol  120 mg Oral Q12H   Continuous Infusions: . sodium chloride    . sodium chloride  LOS: 3 days   Marylu Lund, MD [facility] Pager On Amion  If 7PM-7AM, please contact night-coverage 07/01/2019, 5:03 PM

## 2019-07-01 NOTE — Interval H&P Note (Signed)
History and Physical Interval Note:  07/01/2019 12:36 PM  [name] [name]  has presented today for surgery, with the diagnosis of Heart Failure, Chest Pain, CAD.  The various methods of treatment have been discussed with the patient and family. After consideration of risks, benefits and other options for treatment, the patient has consented to  Procedure(s): RIGHT/LEFT HEART CATH AND CORONARY ANGIOGRAPHY (N/A) as a surgical intervention.  The patient's history has been reviewed, patient examined, no change in status, stable for surgery.  I have reviewed the patient's chart and labs.  Questions were answered to the patient's satisfaction.     Itzell Bendavid [employer]

## 2019-07-01 NOTE — Progress Notes (Addendum)
Progress Note  Patient Name: [name] [name] Date of Encounter: 07/01/2019  Primary Cardiologist: Loralie Champagne, MD   Subjective   Yesterday diuresed with IV lasix. Weight unchanged overnight.   Capsule Endoscopy without recent or active bleeding, no AVMs.   Able to tolerate CPAP.   Continues to have some SOB. No CP overnight unless he exerts himself a lot. Otherwise feeling well and feels much less bloated with diuresis.   Inpatient Medications    Scheduled Meds:  amLODipine  5 mg Oral Daily   atorvastatin  80 mg Oral Daily   carvedilol  25 mg Oral BID WC   enoxaparin (LOVENOX) injection  40 mg Subcutaneous Q24H   furosemide  40 mg Intravenous BID   hydrALAZINE  75 mg Oral BID   insulin aspart  0-15 Units Subcutaneous TID WC   insulin glargine  35 Units Subcutaneous QPC breakfast   isosorbide mononitrate  90 mg Oral Daily   pantoprazole  40 mg Oral Daily   potassium chloride  20 mEq Oral BID   sodium chloride flush  3 mL Intravenous Q12H   sotalol  120 mg Oral Q12H   Continuous Infusions:  sodium chloride     sodium chloride 10 mL/hr at 07/01/19 0611   PRN Meds: sodium chloride, acetaminophen, nitroGLYCERIN, sodium chloride flush   Vital Signs    Vitals:   06/30/19 2134 06/30/19 2255 07/01/19 0222 07/01/19 0406  BP: 117/61   134/78  Pulse: (!) 57 67  (!) 59  Resp:  18  18  Temp:    99 F (37.2 C)  TempSrc:    Oral  SpO2:  97%  100%  Weight:   89.2 kg   Height:        Intake/Output Summary (Last 24 hours) at 07/01/2019 0921 Last data filed at 07/01/2019 0824 Gross per 24 hour  Intake 684 ml  Output 1200 ml  Net -516 ml   Filed Weights   06/29/19 0513 06/30/19 0436 07/01/19 0222  Weight: 90.7 kg 89.4 kg 89.2 kg    Telemetry    Atrial flutter  ECG    No new ECG- Personally Reviewed  Physical Exam  General:  NAD  No resp difficulty HEENT: AT, [place] Neck: supple. mild JVD. Carotids 2+ bilat; no bruits. No  lymphadenopathy or thryomegaly appreciated. Cor: PMI nondisplaced. Regular rate, irregular rhythm. No rubs, gallops or murmurs. Lungs: clear on room air.  Abdomen: soft, nontender, nondistended. No hepatosplenomegaly. No bruits or masses. Good bowel sounds. Extremities: no cyanosis, clubbing, rash, edema Neuro: alert & orientedx3, cranial nerves grossly intact. moves all 4 extremities w/o difficulty. Affect pleasant   Labs    Chemistry Recent Labs  Lab 06/27/19 0013  06/29/19 0535 06/30/19 0722 07/01/19 0452  NA 140   < > 141 140 140  K 3.6   < > 3.6 3.6 3.9  CL 109   < > 112* 107 107  CO2 21*   < > 22 22 21*  GLUCOSE 93   < > 115* 106* 101*  BUN 13   < > 14 15 17   CREATININE 1.08   < > 1.14 1.13 1.17  CALCIUM 8.9   < > 8.6* 9.2 9.1  PROT 6.9  --   --   --   --   ALBUMIN 3.2*  --   --   --   --   AST 14*  --   --   --   --   ALT  19  --   --   --   --   ALKPHOS 52  --   --   --   --   BILITOT 1.0  --   --   --   --   GFRNONAA >60   < > >60 >60 >60  GFRAA >60   < > >60 >60 >60  ANIONGAP 10   < > 7 11 12    < > = values in this interval not displayed.     Hematology Recent Labs  Lab 06/29/19 0535 06/30/19 0722 07/01/19 0452  WBC 8.3 7.6 8.4  RBC 3.78* 4.04* 4.14*  HGB 8.1* 8.9* 8.9*  HCT 27.9* 29.2* 30.3*  MCV 73.8* 72.3* 73.2*  MCH 21.4* 22.0* 21.5*  MCHC 29.0* 30.5 29.4*  RDW 19.0* 19.7* 20.5*  PLT 289 315 291    Cardiac EnzymesNo results for input(s): TROPONINI in the last 168 hours. No results for input(s): TROPIPOC in the last 168 hours.   BNP Recent Labs  Lab 06/26/19 2215  BNP 290.8*     DDimer  Recent Labs  Lab 06/26/19 2215 06/27/19 0013  DDIMER <0.27 0.32     Radiology    Dg Abd 1 View  Result Date: 07/01/2019 CLINICAL DATA:  Patient had capsule endoscopy yesterday, PillCam was noted in the distal small bowel at completion of the study. Ensure the PillCam has exited the colon. EXAM: ABDOMEN - 1 VIEW COMPARISON:  CT 627 FINDINGS: No  radiopaque foreign body or PillCam visualized. No bowel dilatation to suggest obstruction. Small volume of stool in the colon. Cholecystectomy clips in the right upper quadrant. Cardiomegaly. Chronic calcification adjacent to the left superior ramus. IMPRESSION: 1. No radiopaque foreign body or PillCam visualized. 2. Normal bowel gas pattern. Electronically Signed   By: Keith Rake M.D.   On: 07/01/2019 06:37    Cardiac Studies   None this admission  Patient Profile     63yo male seen in HF clinic with PMH HFrEF with resolution, diastolic dysfunction, atrial flutter, HTN, and chronic anemia who presented with exertional angina and SOB at rest.   Assessment & Plan    1. Atrial fibrillation/flutter: History of paroxysmal atrial fibrillation, had been on Xarelto and sotalol.  This admission, he was noted to be in rate-controlled atrial flutter.   Remains in A flutter. Rate controlled.   - Xarelto stopped with anemia/suspected GI bleeding but capsule endoscopy yesterday without acute findings.  - start eliquis after cath as less risk for GI bleed - Continue sotalol for now, QTc has been ok. Avoid QT-prolonging meds.  - Will need to cardiovert him to NSR eventually. Aim for DCCV after 3-4 weeks of therapeutic anticoagulation.  - Consider atrial flutter ablation down the road.   2. Coronary artery disease: LHC in 9/16 showed diffuse CAD, most significant involving the distal RCA. Patient had DES x 2 to RCA. Cath in 3/17 with patent RCA stents and diffuse distal and branch vessel disease concerning for poorly controlled diabetes. Cath in 5/18 again showed diffuse CAD without good interventional target. He has had worsening exertional chest pain over the last couple of months, now reports mild tightness in his chest just walking around the house. This could be due to his underlying diffuse distal/branch vessel disease + the added stress of significant anemia (no progression of CAD, no  interventional option) or he could have progressive CAD with possible interventional options.  - capsule endoscopy negative - Continue atorvastatin 80 mg daily -  lipid panel yesterday, LDL at goal, 25. HDL decreased at 31 - Continue  Imdur to 90 mg daily.  - He is on amlodipine and Coreg.  - Avoid ranolazine with sotalol use.  - RHC today  3. Acute on chronic diastolic CHF: EF 99991111 on cardiac MRI in 6/14 but EF improved back to 55-60% on 2/16 echo. However, EF down to 26% on Cardiolite in 9/16. I think this was inaccurate. EF was 55-60% by both LV-gram and echo in 9/16 and 55-60% on 6/17 echo. Echo in 9/20 showed EF 60-65%, moderate LVH, normal RV, mild-moderate MR. Weight at last d/c 193 9/15, currently 197.   - Volume status continues to be mildly elevated with +JVP. Continue IV lasix 40 mg bid.  - RHC later today   4. Hypertension: BP appears controlled on current meds.   5. OSA: Continue CPAP.  Previous episodes of bradycardia likely related to OSA.   6. Anemia: Suspected GI bleed but recent EGD/c-scope 9/20 without findings. Capsule endoscopy 11/16 without any findings of bleeding. Hgb stable at 8.9, receiving feraheme - Transfuse hgb < 7.   Signed, Marty Heck, DO  07/01/2019, 9:21 AM    Patient seen with resident, agree with the above note.    Stable hgb today at 8.9.  Capsule endoscopy did not show a source of bleeding.  Some diuresis today but not marked.   On exam, JVP still around 10 cm.   I will plan left and right heart catheterization today.  Discussed risks/benefits with patient, he agrees to procedure. This will allow [place] to see how much further diuresis he needs and whether there is any interventional target to help his chest pain.   He will resume anticoagulation with Eliquis 5 mg bid after his catheterization, then will need DCCV in 3-4 weeks.   Loralie Champagne 07/01/2019 11:18 AM

## 2019-07-01 NOTE — H&P (View-Only) (Signed)
Progress Note  Patient Name: [name] [name] Date of Encounter: 07/01/2019  Primary Cardiologist: Loralie Champagne, MD   Subjective   Yesterday diuresed with IV lasix. Weight unchanged overnight.   Capsule Endoscopy without recent or active bleeding, no AVMs.   Able to tolerate CPAP.   Continues to have some SOB. No CP overnight unless he exerts himself a lot. Otherwise feeling well and feels much less bloated with diuresis.   Inpatient Medications    Scheduled Meds:  amLODipine  5 mg Oral Daily   atorvastatin  80 mg Oral Daily   carvedilol  25 mg Oral BID WC   enoxaparin (LOVENOX) injection  40 mg Subcutaneous Q24H   furosemide  40 mg Intravenous BID   hydrALAZINE  75 mg Oral BID   insulin aspart  0-15 Units Subcutaneous TID WC   insulin glargine  35 Units Subcutaneous QPC breakfast   isosorbide mononitrate  90 mg Oral Daily   pantoprazole  40 mg Oral Daily   potassium chloride  20 mEq Oral BID   sodium chloride flush  3 mL Intravenous Q12H   sotalol  120 mg Oral Q12H   Continuous Infusions:  sodium chloride     sodium chloride 10 mL/hr at 07/01/19 0611   PRN Meds: sodium chloride, acetaminophen, nitroGLYCERIN, sodium chloride flush   Vital Signs    Vitals:   06/30/19 2134 06/30/19 2255 07/01/19 0222 07/01/19 0406  BP: 117/61   134/78  Pulse: (!) 57 67  (!) 59  Resp:  18  18  Temp:    99 F (37.2 C)  TempSrc:    Oral  SpO2:  97%  100%  Weight:   89.2 kg   Height:        Intake/Output Summary (Last 24 hours) at 07/01/2019 0921 Last data filed at 07/01/2019 0824 Gross per 24 hour  Intake 684 ml  Output 1200 ml  Net -516 ml   Filed Weights   06/29/19 0513 06/30/19 0436 07/01/19 0222  Weight: 90.7 kg 89.4 kg 89.2 kg    Telemetry    Atrial flutter  ECG    No new ECG- Personally Reviewed  Physical Exam  General:  NAD  No resp difficulty HEENT: AT, [place] Neck: supple. mild JVD. Carotids 2+ bilat; no bruits. No  lymphadenopathy or thryomegaly appreciated. Cor: PMI nondisplaced. Regular rate, irregular rhythm. No rubs, gallops or murmurs. Lungs: clear on room air.  Abdomen: soft, nontender, nondistended. No hepatosplenomegaly. No bruits or masses. Good bowel sounds. Extremities: no cyanosis, clubbing, rash, edema Neuro: alert & orientedx3, cranial nerves grossly intact. moves all 4 extremities w/o difficulty. Affect pleasant   Labs    Chemistry Recent Labs  Lab 06/27/19 0013  06/29/19 0535 06/30/19 0722 07/01/19 0452  NA 140   < > 141 140 140  K 3.6   < > 3.6 3.6 3.9  CL 109   < > 112* 107 107  CO2 21*   < > 22 22 21*  GLUCOSE 93   < > 115* 106* 101*  BUN 13   < > 14 15 17   CREATININE 1.08   < > 1.14 1.13 1.17  CALCIUM 8.9   < > 8.6* 9.2 9.1  PROT 6.9  --   --   --   --   ALBUMIN 3.2*  --   --   --   --   AST 14*  --   --   --   --   ALT  19  --   --   --   --   ALKPHOS 52  --   --   --   --   BILITOT 1.0  --   --   --   --   GFRNONAA >60   < > >60 >60 >60  GFRAA >60   < > >60 >60 >60  ANIONGAP 10   < > 7 11 12    < > = values in this interval not displayed.     Hematology Recent Labs  Lab 06/29/19 0535 06/30/19 0722 07/01/19 0452  WBC 8.3 7.6 8.4  RBC 3.78* 4.04* 4.14*  HGB 8.1* 8.9* 8.9*  HCT 27.9* 29.2* 30.3*  MCV 73.8* 72.3* 73.2*  MCH 21.4* 22.0* 21.5*  MCHC 29.0* 30.5 29.4*  RDW 19.0* 19.7* 20.5*  PLT 289 315 291    Cardiac EnzymesNo results for input(s): TROPONINI in the last 168 hours. No results for input(s): TROPIPOC in the last 168 hours.   BNP Recent Labs  Lab 06/26/19 2215  BNP 290.8*     DDimer  Recent Labs  Lab 06/26/19 2215 06/27/19 0013  DDIMER <0.27 0.32     Radiology    Dg Abd 1 View  Result Date: 07/01/2019 CLINICAL DATA:  Patient had capsule endoscopy yesterday, PillCam was noted in the distal small bowel at completion of the study. Ensure the PillCam has exited the colon. EXAM: ABDOMEN - 1 VIEW COMPARISON:  CT 627 FINDINGS: No  radiopaque foreign body or PillCam visualized. No bowel dilatation to suggest obstruction. Small volume of stool in the colon. Cholecystectomy clips in the right upper quadrant. Cardiomegaly. Chronic calcification adjacent to the left superior ramus. IMPRESSION: 1. No radiopaque foreign body or PillCam visualized. 2. Normal bowel gas pattern. Electronically Signed   By: Keith Rake M.D.   On: 07/01/2019 06:37    Cardiac Studies   None this admission  Patient Profile     63yo male seen in HF clinic with PMH HFrEF with resolution, diastolic dysfunction, atrial flutter, HTN, and chronic anemia who presented with exertional angina and SOB at rest.   Assessment & Plan    1. Atrial fibrillation/flutter: History of paroxysmal atrial fibrillation, had been on Xarelto and sotalol.  This admission, he was noted to be in rate-controlled atrial flutter.   Remains in A flutter. Rate controlled.   - Xarelto stopped with anemia/suspected GI bleeding but capsule endoscopy yesterday without acute findings.  - start eliquis after cath as less risk for GI bleed - Continue sotalol for now, QTc has been ok. Avoid QT-prolonging meds.  - Will need to cardiovert him to NSR eventually. Aim for DCCV after 3-4 weeks of therapeutic anticoagulation.  - Consider atrial flutter ablation down the road.   2. Coronary artery disease: LHC in 9/16 showed diffuse CAD, most significant involving the distal RCA. Patient had DES x 2 to RCA. Cath in 3/17 with patent RCA stents and diffuse distal and branch vessel disease concerning for poorly controlled diabetes. Cath in 5/18 again showed diffuse CAD without good interventional target. He has had worsening exertional chest pain over the last couple of months, now reports mild tightness in his chest just walking around the house. This could be due to his underlying diffuse distal/branch vessel disease + the added stress of significant anemia (no progression of CAD, no  interventional option) or he could have progressive CAD with possible interventional options.  - capsule endoscopy negative - Continue atorvastatin 80 mg daily -  lipid panel yesterday, LDL at goal, 25. HDL decreased at 31 - Continue  Imdur to 90 mg daily.  - He is on amlodipine and Coreg.  - Avoid ranolazine with sotalol use.  - RHC today  3. Acute on chronic diastolic CHF: EF 99991111 on cardiac MRI in 6/14 but EF improved back to 55-60% on 2/16 echo. However, EF down to 26% on Cardiolite in 9/16. I think this was inaccurate. EF was 55-60% by both LV-gram and echo in 9/16 and 55-60% on 6/17 echo. Echo in 9/20 showed EF 60-65%, moderate LVH, normal RV, mild-moderate MR. Weight at last d/c 193 9/15, currently 197.   - Volume status continues to be mildly elevated with +JVP. Continue IV lasix 40 mg bid.  - RHC later today   4. Hypertension: BP appears controlled on current meds.   5. OSA: Continue CPAP.  Previous episodes of bradycardia likely related to OSA.   6. Anemia: Suspected GI bleed but recent EGD/c-scope 9/20 without findings. Capsule endoscopy 11/16 without any findings of bleeding. Hgb stable at 8.9, receiving feraheme - Transfuse hgb < 7.   Signed, Marty Heck, DO  07/01/2019, 9:21 AM    Patient seen with resident, agree with the above note.    Stable hgb today at 8.9.  Capsule endoscopy did not show a source of bleeding.  Some diuresis today but not marked.   On exam, JVP still around 10 cm.   I will plan left and right heart catheterization today.  Discussed risks/benefits with patient, he agrees to procedure. This will allow [place] to see how much further diuresis he needs and whether there is any interventional target to help his chest pain.   He will resume anticoagulation with Eliquis 5 mg bid after his catheterization, then will need DCCV in 3-4 weeks.   Loralie Champagne 07/01/2019 11:18 AM

## 2019-07-01 NOTE — Progress Notes (Signed)
[facility] PROGRESS NOTE Subjective Patient had capsule read yesterday by Dr. Therisa Doyne.  No active bleeding in the small bowel no clear AVM tumor etc.  Was given an additional dose of Feraheme.  Going for cardiac cath today. Objective: Vital signs in last 24 hours: Temp:  [98.4 F (36.9 C)-99.7 F (37.6 C)] 99 F (37.2 C) (11/18 0922) Pulse Rate:  [57-68] 58 (11/18 0922) Resp:  [18-20] 18 (11/18 0922) BP: (116-146)/(61-78) 122/75 (11/18 0922) SpO2:  [97 %-100 %] 99 % (11/18 0922) Weight:  [89.2 kg] 89.2 kg (11/18 0222) Last BM Date: 06/30/19  Intake/Output from previous day: 11/17 0701 - 11/18 0700 In: 1364 [P.O.:1364] Out: 1600 [Urine:1600] Intake/Output this shift: No intake/output data recorded.    Lab Results: Recent Labs    06/29/19 0535 06/30/19 0722 07/01/19 0452  WBC 8.3 7.6 8.4  HGB 8.1* 8.9* 8.9*  HCT 27.9* 29.2* 30.3*  PLT 289 315 291   BMET Recent Labs    06/29/19 0535 06/30/19 0722 07/01/19 0452  NA 141 140 140  K 3.6 3.6 3.9  CL 112* 107 107  CO2 22 22 21*  CREATININE 1.14 1.13 1.17   LFT No results for input(s): PROT, AST, ALT, ALKPHOS, BILITOT, BILIDIR, IBILI in the last 72 hours. PT/INR No results for input(s): LABPROT, INR in the last 72 hours. PANCREAS No results for input(s): LIPASE in the last 72 hours.       Studies/Results: Dg Abd 1 View  Result Date: 07/01/2019 CLINICAL DATA:  Patient had capsule endoscopy yesterday, PillCam was noted in the distal small bowel at completion of the study. Ensure the PillCam has exited the colon. EXAM: ABDOMEN - 1 VIEW COMPARISON:  CT 627 FINDINGS: No radiopaque foreign body or PillCam visualized. No bowel dilatation to suggest obstruction. Small volume of stool in the colon. Cholecystectomy clips in the right upper quadrant. Cardiomegaly. Chronic calcification adjacent to the left superior ramus. IMPRESSION: 1. No radiopaque foreign body or PillCam visualized. 2. Normal bowel gas pattern.  Electronically Signed   By: Keith Rake M.D.   On: 07/01/2019 06:37    Medications: I have reviewed the patient's current medications.  Assessment:   1.  Iron deficiency anemia.  Probably due to chronic GI bleeding.  EGD recently nonremarkable colonoscopy showed diverticulosis and some hemorrhoids tiny adenoma removed.  Has had some intermittent hemorrhoidal type bleeding.  Small bowel capsule shows no active bleeding.  Agree with iron replacement and resuming anticoagulants. 2.  PAF/CAD/CHF.  Has previously been on Xarelto.   Plan: 1.  Cardiac cath planned for today.  Agree with Dr. Aundra Dubin that Eliquis would probably be a better choice since our experience also is that it does cause less GI bleeding. 2.  We will have him follow-up in the office with Dr. Therisa Doyne 3 to 4 weeks after discharge.  We will sign off but please feel free to call us if there are further problems   Nancy Fetter 07/01/2019, 9:31 AM  This note was created using voice recognition software. Minor errors may Have occurred unintentionally.  Pager: [phone] If no answer or after hours call [phone]

## 2019-07-02 ENCOUNTER — Other Ambulatory Visit (HOSPITAL_COMMUNITY): Payer: Self-pay | Admitting: Cardiology

## 2019-07-02 ENCOUNTER — Encounter (HOSPITAL_COMMUNITY): Payer: Self-pay | Admitting: Cardiology

## 2019-07-02 DIAGNOSIS — I5032 Chronic diastolic (congestive) heart failure: Secondary | ICD-10-CM

## 2019-07-02 LAB — CBC
HCT: 30.3 % — ABNORMAL LOW (ref 39.0–52.0)
Hemoglobin: 8.9 g/dL — ABNORMAL LOW (ref 13.0–17.0)
MCH: 21.9 pg — ABNORMAL LOW (ref 26.0–34.0)
MCHC: 29.4 g/dL — ABNORMAL LOW (ref 30.0–36.0)
MCV: 74.4 fL — ABNORMAL LOW (ref 80.0–100.0)
Platelets: 295 10*3/uL (ref 150–400)
RBC: 4.07 MIL/uL — ABNORMAL LOW (ref 4.22–5.81)
RDW: 21.2 % — ABNORMAL HIGH (ref 11.5–15.5)
WBC: 6.7 10*3/uL (ref 4.0–10.5)
nRBC: 0.3 % — ABNORMAL HIGH (ref 0.0–0.2)

## 2019-07-02 LAB — CULTURE, BLOOD (ROUTINE X 2)
Culture: NO GROWTH
Culture: NO GROWTH

## 2019-07-02 LAB — BASIC METABOLIC PANEL
Anion gap: 11 (ref 5–15)
BUN: 14 mg/dL (ref 8–23)
CO2: 22 mmol/L (ref 22–32)
Calcium: 8.9 mg/dL (ref 8.9–10.3)
Chloride: 104 mmol/L (ref 98–111)
Creatinine, Ser: 1.32 mg/dL — ABNORMAL HIGH (ref 0.61–1.24)
GFR calc Af Amer: >60 mL/min (ref >60)
GFR calc non Af Amer: 56 mL/min — ABNORMAL LOW (ref >60)
Glucose, Bld: 80 mg/dL (ref 70–99)
Potassium: 3.8 mmol/L (ref 3.5–5.1)
Sodium: 137 mmol/L (ref 135–145)

## 2019-07-02 LAB — GLUCOSE, CAPILLARY
Glucose-Capillary: 92 mg/dL (ref 70–99)
Glucose-Capillary: 139 mg/dL — ABNORMAL HIGH (ref 70–99)

## 2019-07-02 LAB — MRSA PCR SCREENING: MRSA by PCR: NEGATIVE

## 2019-07-02 LAB — MAGNESIUM: Magnesium: 2.2 mg/dL (ref 1.7–2.4)

## 2019-07-02 MED ORDER — SODIUM CHLORIDE 0.9 % IV SOLN
INTRAVENOUS | Status: DC
  Administered 2019-07-02: 10:00:00 via INTRAVENOUS

## 2019-07-02 MED ORDER — FUROSEMIDE 40 MG PO TABS
40.0000 mg | ORAL_TABLET | Freq: Two times a day (BID) | ORAL | Status: DC
  Administered 2019-07-02: 40 mg via ORAL
  Filled 2019-07-02: qty 1

## 2019-07-02 MED ORDER — ISOSORBIDE MONONITRATE ER 120 MG PO TB24: 120.0000 mg | ORAL_TABLET | Freq: Every day | ORAL | 6 refills | Status: DC

## 2019-07-02 MED ORDER — FUROSEMIDE 40 MG PO TABS: 40.0000 mg | ORAL_TABLET | Freq: Two times a day (BID) | ORAL | 0 refills | Status: DC

## 2019-07-02 MED ORDER — HYDRALAZINE HCL 25 MG PO TABS: 75.0000 mg | ORAL_TABLET | Freq: Two times a day (BID) | ORAL | 0 refills | Status: DC

## 2019-07-02 MED ORDER — APIXABAN 5 MG PO TABS: 5.0000 mg | ORAL_TABLET | Freq: Two times a day (BID) | ORAL | 0 refills | Status: DC

## 2019-07-02 MED ORDER — ISOSORBIDE MONONITRATE ER 120 MG PO TB24: 120.0000 mg | ORAL_TABLET | Freq: Every day | ORAL | 0 refills | Status: DC

## 2019-07-02 MED FILL — hydrALAZINE HCL 25 MG TABS: 25 | 30 days supply | Qty: 180 | Fill #0

## 2019-07-02 MED FILL — ELIQUIS 5 MG TABLET: 5 | 30 days supply | Qty: 60 | Fill #0

## 2019-07-02 MED FILL — FUROSEMIDE 40 MG TABLET: 40 | 30 days supply | Qty: 60 | Fill #0

## 2019-07-02 NOTE — TOC Transition Note (Signed)
Transition of Care [facility]) - CM/SW Discharge Note   Patient Details  Name: [name] [name] MRN: [id number] Date of Birth: 1956/03/18  Transition of Care [facility]) CM/SW Contact:  Zenon Mayo, RN Phone Number: 07/02/2019, 1:55 PM   Clinical Narrative:    NCM informed patient that co pay for refill is 8.95, he understands.  TOC is filling the first 30 days free with coupon.   Final next level of care: Home/Self Care Barriers to Discharge: No Barriers Identified   Patient Goals and CMS Choice Patient states their goals for this hospitalization and ongoing recovery are:: go home   Choice offered to / list presented to : NA  Discharge Placement                       Discharge Plan and Services                DME Arranged: (NA)         HH Arranged: NA          Social Determinants of Health (SDOH) Interventions     Readmission Risk Interventions No flowsheet data found.

## 2019-07-02 NOTE — Progress Notes (Addendum)
CARDIAC REHAB PHASE I   PRE:  Rate/Rhythm: 58 aflutter    BP: sitting 127/78    SaO2: 99 RA  MODE:  Ambulation: 470 ft   POST:  Rate/Rhythm: 90 afib    BP: sitting 142/78     SaO2: 99 RA  Pt tolerated fair. Sts he is tired with distance. No CP. Reviewed education. Will refer to [place] under stable angina diagnosis. [place], ACSM 07/02/2019 11:29 AM

## 2019-07-02 NOTE — Discharge Summary (Signed)
Physician Discharge Summary  [name] [name] MRN:[id number] DOB: 04/13/1956 DOA: 06/26/2019  PCP: Banks, Shannon R, MD  Admit date: 06/26/2019 Discharge date: 07/02/2019  Admitted From: Home Disposition:  home  Recommendations for Outpatient Follow-up:  1. Follow up with PCP in 1-2 weeks 2. Follow up with Cardiology as scheduled  Discharge Condition:Stable CODE STATUS:Full Diet recommendation: Diabetic, heart healthy   Brief/Interim Summary: 63-year-old male with history of diffuse CAD, atrial flutter on Xarelto, chronic diastolic CHF,presented to [place] emergency room with exertional chest pain in September at that time he was noted to have drop in his hemoglobin from baseline 11 to 7-8 range,he had an EGD and colonoscopy at that time which was unremarkable,followed by Dr. Karki with [facility], plan for capsule endoscopy next week and IV iron on Tuesday -Presented to the emergency room again with exertional chest pain, concern for progressive CAD however work-up limited by ongoing iron deficiency anemia -GI and cardiology following, underwent capsule endoscopy 11/16  Discharge Diagnoses:  Principal Problem:   Acute on chronic congestive heart failure with left ventricular diastolic dysfunction (HCC) Active Problems:   HYPERCHOLESTEROLEMIA   GOUT   NICM (nonischemic cardiomyopathy) (HCC)   Obstructive sleep apnea   Essential hypertension   Chronic anticoagulation   Atrial flutter with controlled response (HCC)   CKD (chronic kidney disease), stage III   Type 2 diabetes mellitus without complication (HCC)   Iron deficiency anemia due to chronic blood loss   Generalized weakness  1. Diffuse CAD, exertional chest pain -last cath 12/2016,noted far distal LAD with 90% stenosis, patent proximal and distal RCA stents -Appreciate cardiology input, situation complicated by iron deficiency anemia in the setting of Xarelto use for atrial flutter, recent GI work-up was  unremarkable -Xarelto had been on hold for heart cath. Now on eliquis -Pt now s/p heart cath, reviewed. Findings of extensive heart disease, however not amenable to CABG -Now on increased Imdur dose per cardiology -Currently stable and stable for d/c  2.Iron deficiency anemia -Subacute and progressive for 2 to 3 months now,hospitalized in September for same, -EGD and colonoscopy were unremarkable -Given IV iron 11/14  -[facility] following, capsule endoscopycompleted, reviewed and is unremarkable -Has remained stable  3.Acute on chronic diastolic CHF -Last echo 04/2019 with preserved EF, moderate LVH mild to moderate MR -Heart failure team following, clinically improving with diuresis as well -Remained stable  4.Atrial flutter -Continue carvedilol and sotalol as tolerated -Held NOAC prior to heart cath, eliquis started -long term, plan for cardioversion as outpatient  5. type2 diabetes mellitus -CBGs are stable,hemoglobin A1c was 6.8 in April suggesting good control -Continue Lantus and sliding scale, recheck A1c -CBGs stable this hospital course  6. OSA -continue CPAP as tolerated  7. CKD 2 -Cr increased to 1.3 post-cath -Given gentle IVF hydration  Discharge Instructions   Allergies as of 07/02/2019      Reactions   Shrimp [shellfish Allergy] Nausea And Vomiting   Ace Inhibitors Cough   Other Hives, Other (See Comments)   Patient reports developing hives after receiving "some antibiotic given in 1980''s at [facility]". He does not know which antibiotic.      Medication List    STOP taking these medications   rivaroxaban 20 MG Tabs tablet Commonly known as: Xarelto     TAKE these medications   Accu-Chek Aviva Plus w/Device Kit Use as directed for 3 times daily testing of blood glucose   accu-chek softclix lancets Use as instructed for 3 times   daily testing of blood glucose   acetaminophen 500 MG tablet Commonly  known as: TYLENOL Take 1,000 mg by mouth every 6 (six) hours as needed (pain).   amLODipine 5 MG tablet Commonly known as: NORVASC Take 1 tablet (5 mg total) by mouth daily.   apixaban 5 MG Tabs tablet Commonly known as: ELIQUIS Take 1 tablet (5 mg total) by mouth 2 (two) times daily.   atorvastatin 80 MG tablet Commonly known as: LIPITOR Take 1 tablet (80 mg total) by mouth daily.   carvedilol 25 MG tablet Commonly known as: COREG Take 1 tablet (25 mg total) by mouth 2 (two) times daily with a meal.   furosemide 40 MG tablet Commonly known as: LASIX Take 1 tablet (40 mg total) by mouth 2 (two) times daily. What changed:   medication strength  how much to take  how to take this  when to take this  additional instructions   glucose blood test strip Commonly known as: Accu-Chek Aviva Plus Use as instructed for 3 times daily testing of blood glucose   hydrALAZINE 25 MG tablet Commonly known as: APRESOLINE Take 3 tablets (75 mg total) by mouth 2 (two) times daily. What changed: medication strength   Insulin Glargine 100 UNIT/ML Solostar Pen Commonly known as: Lantus SoloStar Inject 35 Units into the skin every morning. And pen needles 1/day What changed:   when to take this  additional instructions   isosorbide mononitrate 120 MG 24 hr tablet Commonly known as: IMDUR Take 1 tablet (120 mg total) by mouth daily. Start taking on: July 03, 2019 What changed:   medication strength  how much to take   metFORMIN 1000 MG tablet Commonly known as: GLUCOPHAGE Take 1 tablet (1,000 mg total) by mouth 2 (two) times daily with a meal.   MUCINEX PO Take 1 tablet by mouth 2 (two) times daily as needed (cough/congestion).   multivitamin with minerals Tabs tablet Take 1 tablet by mouth daily.   nitroGLYCERIN 0.4 MG SL tablet Commonly known as: NITROSTAT Place 1 tablet (0.4 mg total) under the tongue every 5 (five) minutes as needed for chest pain (x 3 tabs  daily). What changed:   when to take this  reasons to take this   ondansetron 4 MG disintegrating tablet Commonly known as: ZOFRAN-ODT Take 1 tablet (4 mg total) by mouth every 8 (eight) hours as needed for nausea or vomiting.   pantoprazole 40 MG tablet Commonly known as: Protonix Take 1 tablet (40 mg total) by mouth daily.   sotalol 120 MG tablet Commonly known as: BETAPACE TAKE 1 TABLET BY MOUTH EVERY 12 HOURS   TRUEplus Lancets 28G Misc 1 each by Does not apply route 3 (three) times daily.   Accu-Chek Softclix Lancets lancets Use as instructed for three times daily testing of blood glucose      Follow-up Information    [facility] Follow up on 07/13/2019.   Specialty: Cardiology Why: Dr. McLean's office 10:00 AM Parking Garage Code 7008 Contact information: [address] [id number] [place] [address] [phone]         Allergies  Allergen Reactions  . Shrimp [Shellfish Allergy] Nausea And Vomiting  . Ace Inhibitors Cough  . Other Hives and Other (See Comments)    Patient reports developing hives after receiving "some antibiotic given in 1980''s at [facility]". He does not know which antibiotic.    Consultations:  GI  Cardiology  Procedures/Studies:   Dg Abd 1 View  Result Date: 07/01/2019 CLINICAL DATA:  Patient had capsule endoscopy yesterday, PillCam was noted in the distal small bowel at completion of the study. Ensure the PillCam has exited the colon. EXAM: ABDOMEN - 1 VIEW COMPARISON:  CT 627 FINDINGS: No radiopaque foreign body or PillCam visualized. No bowel dilatation to suggest obstruction. Small volume of stool in the colon. Cholecystectomy clips in the right upper quadrant. Cardiomegaly. Chronic calcification adjacent to the left superior ramus. IMPRESSION: 1. No radiopaque foreign body or PillCam visualized. 2. Normal bowel gas pattern. Electronically Signed   By:  Melanie  Sanford M.D.   On: 07/01/2019 06:37   Dg Chest Portable 1 View  Result Date: 06/26/2019 CLINICAL DATA:  Shortness of breath. Intermittent chest pain. EXAM: PORTABLE CHEST 1 VIEW COMPARISON:  Frontal and lateral views radiograph 04/24/2019 FINDINGS: Mild cardiomegaly, not significantly changed allowing for differences in technique. Unchanged mediastinal contours. No pulmonary edema, focal airspace disease, pleural effusion or pneumothorax. No acute osseous abnormalities are seen. IMPRESSION: Stable cardiomegaly. No acute abnormality. Electronically Signed   By: Melanie  Sanford M.D.   On: 06/26/2019 18:38     Subjective: Feeling better   Discharge Exam: Vitals:   07/02/19 0739 07/02/19 1137  BP: 123/86 (!) 142/78  Pulse: (!) 53 (!) 59  Resp:  19  Temp:  98.2 F (36.8 C)  SpO2: 99% 100%   Vitals:   07/02/19 0437 07/02/19 0652 07/02/19 0739 07/02/19 1137  BP: 123/64  123/86 (!) 142/78  Pulse: (!) 56  (!) 53 (!) 59  Resp: 16   19  Temp: 98.4 F (36.9 C)   98.2 F (36.8 C)  TempSrc: Oral   Oral  SpO2: 98%  99% 100%  Weight:  91.2 kg    Height:        General: Pt is alert, awake, not in acute distress Cardiovascular: RRR, S1/S2 +, no rubs, no gallops Respiratory: CTA bilaterally, no wheezing, no rhonchi Abdominal: Soft, NT, ND, bowel sounds + Extremities: no edema, no cyanosis   The results of significant diagnostics from this hospitalization (including imaging, microbiology, ancillary and laboratory) are listed below for reference.     Microbiology: Recent Results (from the past 240 hour(s))  SARS CORONAVIRUS 2 (TAT 6-24 HRS) Nasopharyngeal Nasopharyngeal Swab     Status: None   Collection Time: 06/26/19 11:28 PM   Specimen: Nasopharyngeal Swab  Result Value Ref Range Status   SARS Coronavirus 2 NEGATIVE NEGATIVE Final    Comment: (NOTE) SARS-CoV-2 target nucleic acids are NOT DETECTED. The SARS-CoV-2 RNA is generally detectable in upper and  lower respiratory specimens during the acute phase of infection. Negative results do not preclude SARS-CoV-2 infection, do not rule out co-infections with other pathogens, and should not be used as the sole basis for treatment or other patient management decisions. Negative results must be combined with clinical observations, patient history, and epidemiological information. The expected result is Negative. Fact Sheet for Patients: https://www.fda.gov/media/138098/download Fact Sheet for Healthcare Providers: https://www.fda.gov/media/138095/download This test is not yet approved or cleared by the [place] FDA and  has been authorized for detection and/or diagnosis of SARS-CoV-2 by FDA under an Emergency Use Authorization (EUA). This EUA will remain  in effect (meaning this test can be used) for the duration of the COVID-19 declaration under Section 56 4(b)(1) of the Act, 21 U.S.C. section 360bbb-3(b)(1), unless the authorization is terminated or revoked sooner. Performed at [facility], [address] [place], [place] [address]     Blood Culture (routine x 2)     Status: None   Collection Time: 06/27/19  1:00 AM   Specimen: BLOOD  Result Value Ref Range Status   Specimen Description BLOOD LEFT ANTECUBITAL  Final   Special Requests   Final    BOTTLES DRAWN AEROBIC AND ANAEROBIC Blood Culture results may not be optimal due to an inadequate volume of blood received in culture bottles   Culture   Final    NO GROWTH 5 DAYS Performed at [facility], [address] [place], [place] [address]    Report Status 07/02/2019 FINAL  Final  Blood Culture (routine x 2)     Status: None   Collection Time: 06/27/19  1:07 AM   Specimen: Site Not Specified; Blood  Result Value Ref Range Status   Specimen Description SITE NOT SPECIFIED  Final   Special Requests   Final    AEROBIC BOTTLE ONLY Blood Culture results may not be optimal due to an inadequate volume of blood received  in culture bottles   Culture   Final    NO GROWTH 5 DAYS Performed at [facility], [address] [place], [place] [address]    Report Status 07/02/2019 FINAL  Final     Labs: BNP (last 3 results) Recent Labs    09/29/18 1217 06/26/19 2215  BNP 86.6 290.8*   Basic Metabolic Panel: Recent Labs  Lab 06/27/19 0145  06/28/19 0354 06/29/19 0535 06/30/19 0722 07/01/19 0452 07/01/19 1306 07/01/19 1307 07/02/19 0429  NA  --    < > 141 141 140 140 142 142 137  K  --    < > 3.8 3.6 3.6 3.9 3.8 3.7 3.8  CL  --    < > 112* 112* 107 107  --   --  104  CO2  --    < > 20* 22 22 21*  --   --  22  GLUCOSE  --    < > 114* 115* 106* 101*  --   --  80  BUN  --    < > 14 14 15 17  --   --  14  CREATININE  --    < > 1.15 1.14 1.13 1.17  --   --  1.32*  CALCIUM  --    < > 8.7* 8.6* 9.2 9.1  --   --  8.9  MG 1.9  --   --   --   --   --   --   --  2.2   < > = values in this interval not displayed.   Liver Function Tests: Recent Labs  Lab 06/27/19 0013  AST 14*  ALT 19  ALKPHOS 52  BILITOT 1.0  PROT 6.9  ALBUMIN 3.2*   No results for input(s): LIPASE, AMYLASE in the last 168 hours. No results for input(s): AMMONIA in the last 168 hours. CBC: Recent Labs  Lab 06/27/19 0013  06/28/19 0354 06/29/19 0535 06/30/19 0722 07/01/19 0452 07/01/19 1306 07/01/19 1307 07/02/19 0429  WBC 7.6   < > 7.9 8.3 7.6 8.4  --   --  6.7  NEUTROABS 4.8  --   --   --   --   --   --   --   --   HGB 9.7*   < > 8.9* 8.1* 8.9* 8.9* 10.5* 10.2* 8.9*  HCT 33.7*   < > 30.6* 27.9* 29.2* 30.3* 31.0* 30.0* 30.3*  MCV   74.6*   < > 73.7* 73.8* 72.3* 73.2*  --   --  74.4*  PLT 300   < > 324 289 315 291  --   --  295   < > = values in this interval not displayed.   Cardiac Enzymes: Recent Labs  Lab 06/26/19 1824  CKTOTAL 80   BNP: Invalid input(s): POCBNP CBG: Recent Labs  Lab 07/01/19 1155 07/01/19 1620 07/01/19 2114 07/02/19 0622 07/02/19 1139  GLUCAP 93 151* 108* 92 139*   D-Dimer No  results for input(s): DDIMER in the last 72 hours. Hgb A1c No results for input(s): HGBA1C in the last 72 hours. Lipid Profile Recent Labs    06/30/19 0011  CHOL 81  HDL 31*  LDLCALC 25  TRIG 127  CHOLHDL 2.6   Thyroid function studies No results for input(s): TSH, T4TOTAL, T3FREE, THYROIDAB in the last 72 hours.  Invalid input(s): FREET3 Anemia work up No results for input(s): VITAMINB12, FOLATE, FERRITIN, TIBC, IRON, RETICCTPCT in the last 72 hours. Urinalysis    Component Value Date/Time   COLORURINE YELLOW 03/23/2019 1343   APPEARANCEUR CLEAR 03/23/2019 1343   LABSPEC 1.025 03/23/2019 1343   PHURINE 5.5 03/23/2019 1343   GLUCOSEU NEGATIVE 03/23/2019 1343   HGBUR NEGATIVE 03/23/2019 1343   HGBUR moderate 09/27/2010 1646   BILIRUBINUR NEGATIVE 03/23/2019 1343   BILIRUBINUR small (A) 03/13/2019 1620   BILIRUBINUR neg 05/18/2014 1239   KETONESUR NEGATIVE 03/23/2019 1343   PROTEINUR >=300 (A) 03/13/2019 1620   PROTEINUR 30 05/18/2014 1239   PROTEINUR NEGATIVE 11/08/2013 1042   UROBILINOGEN 0.2 03/23/2019 1343   NITRITE NEGATIVE 03/23/2019 1343   LEUKOCYTESUR NEGATIVE 03/23/2019 1343   Sepsis Labs Invalid input(s): PROCALCITONIN,  WBC,  LACTICIDVEN Microbiology Recent Results (from the past 240 hour(s))  SARS CORONAVIRUS 2 (TAT 6-24 HRS) Nasopharyngeal Nasopharyngeal Swab     Status: None   Collection Time: 06/26/19 11:28 PM   Specimen: Nasopharyngeal Swab  Result Value Ref Range Status   SARS Coronavirus 2 NEGATIVE NEGATIVE Final    Comment: (NOTE) SARS-CoV-2 target nucleic acids are NOT DETECTED. The SARS-CoV-2 RNA is generally detectable in upper and lower respiratory specimens during the acute phase of infection. Negative results do not preclude SARS-CoV-2 infection, do not rule out co-infections with other pathogens, and should not be used as the sole basis for treatment or other patient management decisions. Negative results must be combined with clinical  observations, patient history, and epidemiological information. The expected result is Negative. Fact Sheet for Patients: https://www.fda.gov/media/138098/download Fact Sheet for Healthcare Providers: https://www.fda.gov/media/138095/download This test is not yet approved or cleared by the [place] FDA and  has been authorized for detection and/or diagnosis of SARS-CoV-2 by FDA under an Emergency Use Authorization (EUA). This EUA will remain  in effect (meaning this test can be used) for the duration of the COVID-19 declaration under Section 56 4(b)(1) of the Act, 21 U.S.C. section 360bbb-3(b)(1), unless the authorization is terminated or revoked sooner. Performed at [facility], [address] [place], [place] [address]   Blood Culture (routine x 2)     Status: None   Collection Time: 06/27/19  1:00 AM   Specimen: BLOOD  Result Value Ref Range Status   Specimen Description BLOOD LEFT ANTECUBITAL  Final   Special Requests   Final    BOTTLES DRAWN AEROBIC AND ANAEROBIC Blood Culture results may not be optimal due to an inadequate volume of blood received in culture bottles   Culture     Final    NO GROWTH 5 DAYS Performed at [facility], [place] [address], [place], [place] [address]    Report Status 07/02/2019 FINAL  Final  Blood Culture (routine x 2)     Status: None   Collection Time: 06/27/19  1:07 AM   Specimen: Site Not Specified; Blood  Result Value Ref Range Status   Specimen Description SITE NOT SPECIFIED  Final   Special Requests   Final    AEROBIC BOTTLE ONLY Blood Culture results may not be optimal due to an inadequate volume of blood received in culture bottles   Culture   Final    NO GROWTH 5 DAYS Performed at [facility], [place] [address] [place], [place] [address]    Report Status 07/02/2019 FINAL  Final   Time spent: 30 min  SIGNED:   Marylu Lund, MD  [facility] 07/02/2019, 11:51 AM  If 7PM-7AM, please contact  night-coverage

## 2019-07-02 NOTE — Care Management Important Message (Signed)
Important Message  Patient Details  Name: [name] [name] MRN: [id number] Date of Birth: [birth year]   Medicare Important Message Given:  Yes     Shelda Altes 07/02/2019, 2:04 PM

## 2019-07-02 NOTE — Care Management (Signed)
Per [place] ,[phone]. Co-pay for Eliquis 2.5 mg or 5 mg bid for 30 day supply $8.95 retail pharmacy. Co-pay for Eliquis 2.5 mg or 5 mg. bid for a 90 day supply  $8.95 (mail order pharmacy [facility]).  No PA required Deductible has been met. Tier 3 medication Retail pharmacies: [facility]. Ref# .[id number]

## 2019-07-02 NOTE — Discharge Instructions (Signed)

## 2019-07-02 NOTE — Progress Notes (Addendum)
Progress Note  Patient Name: [name] [name] Date of Encounter: 07/02/2019  Primary Cardiologist: Loralie Champagne, MD   Subjective   Capsule Endoscopy without recent or active bleeding, no AVMs.   Hgb stable at 8.9. Had BM today and denies melena/ hematochezia. S/p cath via rt radial artery. Stable cath site. No hematoma. 2+ radial pulse.   R/LHC yesterday demonstrated diffuse distal and branch vessel disease and normal filling pressures w/ persevered CO. No PCI performed. No CP. Still w/ mild exertional dyspnea.   Only received 1 dose of IV Lasix yesterday, 40 mg. 1.8L out yesterday. Wt up 5 lb from.  Bump in SCr from 1.17>>1.32.    R/LHC 07/01/19  Dominance: Right Left Main  Mild distal LM narrowing.  Left Anterior Descending  50% ostial LAD stenosis. Diffuse 30-40% stenoses throughout the proximal and mid LAD. The distal LAD is diffusely disease culminating in an area of 95% stenosis far distally.  Ramus Intermedius  Large vessel. 40% proximal and 40% mid ramus stenosis. Small 1st branch off ramus with 80% ostial stenosis. Small to moderate 2nd branch off ramus with 95% ostial stenosis.  Left Circumflex  Relatively small LCx with comparatively large ramus. Long up to 70% ostial and proximal LCx stenosis.  Right Coronary Artery  Patent proximal and distal RCA stents, distal stent with 30% in-stent restenosis. 50-60% stenosis proximal RCA just distal to stent. Diffuse up to 60% mid RCA stenosis.    Right Heart Pressures RHC Procedural Findings: Hemodynamics (mmHg) RA mean 4 RV 40/6 PA 46/14, mean 26 PCWP mean 13 LV 98/8 AO 92/51  Oxygen saturations: PA 66% AO 100%  Cardiac Output (Fick) 6.83  Cardiac Index (Fick) 3.23 PVR 1.9 WU     Inpatient Medications    Scheduled Meds:  amLODipine  5 mg Oral Daily   apixaban  5 mg Oral BID   atorvastatin  80 mg Oral Daily   carvedilol  25 mg Oral BID WC   hydrALAZINE  75 mg Oral BID   insulin aspart  0-15  Units Subcutaneous TID WC   insulin glargine  35 Units Subcutaneous QPC breakfast   isosorbide mononitrate  120 mg Oral Daily   pantoprazole  40 mg Oral Daily   sodium chloride flush  3 mL Intravenous Q12H   sotalol  120 mg Oral Q12H   Continuous Infusions:  sodium chloride     sodium chloride     PRN Meds: sodium chloride, acetaminophen, nitroGLYCERIN, ondansetron (ZOFRAN) IV, sodium chloride flush   Vital Signs    Vitals:   07/01/19 1922 07/02/19 0437 07/02/19 0652 07/02/19 0739  BP: 126/70 123/64  123/86  Pulse: (!) 58 (!) 56  (!) 53  Resp:  16    Temp: 98.7 F (37.1 C) 98.4 F (36.9 C)    TempSrc: Oral Oral    SpO2: 98% 98%  99%  Weight:   91.2 kg   Height:        Intake/Output Summary (Last 24 hours) at 07/02/2019 0754 Last data filed at 07/02/2019 0438 Gross per 24 hour  Intake 1497 ml  Output 1875 ml  Net -378 ml   Filed Weights   06/30/19 0436 07/01/19 0222 07/02/19 0652  Weight: 89.4 kg 89.2 kg 91.2 kg    Telemetry    Atrial fibrillation w/ SVR 50s   ECG    No new ECG- Personally Reviewed  Physical Exam  PHYSICAL EXAM: General:  Well appearing middle aged AAM. No respiratory difficulty HEENT: normal  Neck: supple. no JVD. Carotids 2+ bilat; no bruits. No lymphadenopathy or thyromegaly appreciated. Cor: PMI nondisplaced. irregularly irregular rhythm, tachy rate. No rubs, gallops or murmurs. Lungs: clear Abdomen: soft, nontender, nondistended. No hepatosplenomegaly. No bruits or masses. Good bowel sounds. Extremities: no cyanosis, clubbing, rash, edema, rt radial cath site stable w/ 2+ radial pulse  Neuro: alert & oriented x 3, cranial nerves grossly intact. moves all 4 extremities w/o difficulty. Affect pleasant.  Labs    Chemistry Recent Labs  Lab 06/27/19 0013  06/30/19 0722 07/01/19 0452 07/01/19 1306 07/01/19 1307 07/02/19 0429  NA 140   < > 140 140 142 142 137  K 3.6   < > 3.6 3.9 3.8 3.7 3.8  CL 109   < > 107 107  --   --   104  CO2 21*   < > 22 21*  --   --  22  GLUCOSE 93   < > 106* 101*  --   --  80  BUN 13   < > 15 17  --   --  14  CREATININE 1.08   < > 1.13 1.17  --   --  1.32*  CALCIUM 8.9   < > 9.2 9.1  --   --  8.9  PROT 6.9  --   --   --   --   --   --   ALBUMIN 3.2*  --   --   --   --   --   --   AST 14*  --   --   --   --   --   --   ALT 19  --   --   --   --   --   --   ALKPHOS 52  --   --   --   --   --   --   BILITOT 1.0  --   --   --   --   --   --   GFRNONAA >60   < > >60 >60  --   --  56*  GFRAA >60   < > >60 >60  --   --  >60  ANIONGAP 10   < > 11 12  --   --  11   < > = values in this interval not displayed.     Hematology Recent Labs  Lab 06/30/19 0722 07/01/19 0452 07/01/19 1306 07/01/19 1307 07/02/19 0429  WBC 7.6 8.4  --   --  6.7  RBC 4.04* 4.14*  --   --  4.07*  HGB 8.9* 8.9* 10.5* 10.2* 8.9*  HCT 29.2* 30.3* 31.0* 30.0* 30.3*  MCV 72.3* 73.2*  --   --  74.4*  MCH 22.0* 21.5*  --   --  21.9*  MCHC 30.5 29.4*  --   --  29.4*  RDW 19.7* 20.5*  --   --  21.2*  PLT 315 291  --   --  295    Cardiac EnzymesNo results for input(s): TROPONINI in the last 168 hours. No results for input(s): TROPIPOC in the last 168 hours.   BNP Recent Labs  Lab 06/26/19 2215  BNP 290.8*     DDimer  Recent Labs  Lab 06/26/19 2215 06/27/19 0013  DDIMER <0.27 0.32     Radiology    Dg Abd 1 View  Result Date: 07/01/2019 CLINICAL DATA:  Patient had capsule endoscopy yesterday, PillCam was noted in the distal small  bowel at completion of the study. Ensure the PillCam has exited the colon. EXAM: ABDOMEN - 1 VIEW COMPARISON:  CT 627 FINDINGS: No radiopaque foreign body or PillCam visualized. No bowel dilatation to suggest obstruction. Small volume of stool in the colon. Cholecystectomy clips in the right upper quadrant. Cardiomegaly. Chronic calcification adjacent to the left superior ramus. IMPRESSION: 1. No radiopaque foreign body or PillCam visualized. 2. Normal bowel gas pattern.  Electronically Signed   By: Keith Rake M.D.   On: 07/01/2019 06:37     Patient Profile     63yo male seen in HF clinic with PMH HFrEF with resolution, diastolic dysfunction, atrial flutter, HTN, and chronic anemia who presented with exertional angina and SOB at rest.   Assessment & Plan    1. Atrial fibrillation/flutter: History of paroxysmal atrial fibrillation, had been on Xarelto and sotalol.  This admission, he was noted to be in rate-controlled atrial flutter.   Remains in A flutter/fib. Rate controlled. - Xarelto stopped with anemia/suspected GI bleeding but capsule endoscopy without acute findings.  - plan to switch to Eliquis today given lower bleed risk  - Continue sotalol for now, QTc has been ok. Avoid QT-prolonging meds.  - Will need to cardiovert him to NSR eventually. Aim for DCCV after 3-4 weeks of therapeutic anticoagulation.  - Consider atrial flutter ablation down the road.   2. Coronary artery disease: LHC in 9/16 showed diffuse CAD, most significant involving the distal RCA. Patient had DES x 2 to RCA. Cath in 3/17 with patent RCA stents and diffuse distal and branch vessel disease concerning for poorly controlled diabetes. Cath in 5/18 again showed diffuse CAD without good interventional target.  Presented this admit w/ worsening exertional chest pain over the last couple of months w/ minimal activity. Had repeat cath yesterday demonstrating diffuse distal branch vessel coronary disease.  Films reviewed with Dr. Burt Knack, there is no good interventional target. Anatomy not felt to be amenable to CABG. Ongoing medical management recommended.  Currently CP free.  - Continue Coreg 25 bid and amlodipine 5 mg daily  - Continue atorvastatin 80 mg daily - lipid panel yesterday, LDL at goal, 25. HDL decreased at 31 - Continue  Imdur to 90 mg daily.  - Avoid ranolazine with sotalol use.  - no ASA given Eliquis and anemia    3. Acute on chronic diastolic CHF: EF [id number] on  cardiac MRI in 6/14 but EF improved back to 55-60% on 2/16 echo. However, EF down to 26% on Cardiolite in 9/16. I think this was inaccurate. EF was 55-60% by both LV-gram and echo in 9/16 and 55-60% on 6/17 echo. Echo in 9/20 showed EF 60-65%, moderate LVH, normal RV, mild-moderate MR. Treated w/ IV diuretics this admit. RHC yesterday showed normal filling pressures w/ persevered CO.  -Appears euvolemic on exam today. Slight bump in SCr post cath from 1.1>>1.3 -Can resume on previous home lasix dose at d/c, 20 mg daily   4. Hypertension: BP appears controlled on current meds.   5. OSA: Continue CPAP.  Previous episodes of bradycardia likely related to OSA.   6. Anemia: Suspected GI bleed but recent EGD/c-scope 9/20 without findings. Capsule endoscopy 11/16 without any findings of bleeding. Had drop in hgb from 10>>8. S/p cath yesterday via rt radial artery. Site examined and stable. 2+ radial pulse. Had BM this morning and denies melena and hematochezia. Plan is to start Eliquis today for afib/flutter.  -Will need f/u outpatient CBC in 7 days.  -  Continue Protonix    7. AKI: slight bump in SCr from 1.1>>1.30. IV Lasix held last PM.  - can resume PO lasix at 40 mg bid.  -repeat BMP in 1 week   Signed, Lyda Jester, PA-C  07/02/2019, 7:54 AM   7:54 AM  Patient seen with PA, agree with the above note.   Walked around room this morning, no chest pain or dyspnea.  Feels good.   General: NAD Neck: No JVD, no thyromegaly or thyroid nodule.  Lungs: Clear to auscultation bilaterally with normal respiratory effort. CV: Nondisplaced PMI.  Heart regular S1/S2, no S3/S4, no murmur.  No peripheral edema.   Abdomen: Soft, nontender, no hepatosplenomegaly, no distention.  Skin: Intact without lesions or rashes.  Neurologic: Alert and oriented x 3.  Psych: Normal affect. Extremities: No clubbing or cyanosis. Right radial cath site benign.  HEENT: Normal.   He remains in rate-controlled  atrial flutter.  Apixaban started last night.  Will plan 4 wks apixaban (do not miss doses) then will cardiovert.  Continue sotalol. Will arrange as outpatient.   On exam, he looks euvolemic.  Got IV fluid post-cath.  Creatinine fairly stable today.   - Can start Lasix 40 mg po bid for home (had been on 40 qam/20 qpm).   Coronary angiography yesterday with severe distal and branch vessel disease but no interventional targets and not good anatomy for CABG.  Will continue medical management.  - Coreg, amlodipine, and Imdur 120 mg daily for anginal control.  - Would like him to do cardiac rehab as an outpatient.   Could go home today.  Needs followup in 10 days in CHF clinic then DCCV 1 month.  Meds for home: sotalol 120 mg bid, amlodipine 5 mg daily, apixaban 5 mg bid, Coreg 25 bid, hydralazine 75 tid, Imdur 120 mg daily, atorvastatin 80 mg daily, Lasix 40 mg po bid, KCl 20 daily.   Loralie Champagne 07/02/2019 10:50 AM

## 2019-07-06 ENCOUNTER — Telehealth (HOSPITAL_COMMUNITY): Payer: Self-pay

## 2019-07-06 NOTE — Telephone Encounter (Signed)
Pt insurance is active and benefits verified through [facility]. Co-pay $0.00, DED $0.00/$0.00 met, out of pocket $6,700.00/$488.49 met, co-insurance 0%. No pre-authorization required. Passport, 07/06/2019 @ 9:02AM, LRT#[id number]  Will contact patient to see if he is interested in the Cardiac Rehab Program. If interested, patient will need to complete follow up appt. Once completed, patient will be contacted for scheduling upon review by the RN Navigator.

## 2019-07-06 NOTE — Telephone Encounter (Signed)
Called patient to see if he is interested in the Cardiac Rehab Program. Patient expressed interest. Explained scheduling process and went over insurance, patient verbalized understanding. Will contact patient for scheduling once f/u has been completed.  °

## 2019-07-08 ENCOUNTER — Encounter (HOSPITAL_COMMUNITY): Payer: Medicare Other

## 2019-07-10 ENCOUNTER — Other Ambulatory Visit: Payer: Self-pay | Admitting: Cardiology

## 2019-07-10 DIAGNOSIS — I5032 Chronic diastolic (congestive) heart failure: Secondary | ICD-10-CM

## 2019-07-10 MED ORDER — ISOSORBIDE MONONITRATE ER 120 MG PO TB24: 120.0000 mg | ORAL_TABLET | Freq: Every day | ORAL | 6 refills | Status: DC

## 2019-07-10 NOTE — Progress Notes (Signed)
Imdur 30 mg was provided to pt by pharmacy instead of 120 mg as ordered by Dr. Algernon Huxley.  The patient has been taking 4 pills/day and is now out.  I will resend prescription for Imdur 120 mg daily.

## 2019-07-13 ENCOUNTER — Ambulatory Visit (HOSPITAL_COMMUNITY)
Admit: 2019-07-13 | Discharge: 2019-07-13 | Disposition: A | Discharge status: Home / Self Care | Payer: Medicare Other | Source: Ambulatory Visit | Attending: Adult Health | Admitting: Adult Health

## 2019-07-13 ENCOUNTER — Other Ambulatory Visit: Payer: Self-pay

## 2019-07-13 ENCOUNTER — Encounter (HOSPITAL_COMMUNITY): Payer: Self-pay

## 2019-07-13 VITALS — BP 131/60 | HR 82 | Wt 202.8 lb

## 2019-07-13 DIAGNOSIS — D649 Anemia, unspecified: Secondary | ICD-10-CM | POA: Diagnosis not present

## 2019-07-13 DIAGNOSIS — Z8249 Family history of ischemic heart disease and other diseases of the circulatory system: Secondary | ICD-10-CM | POA: Diagnosis not present

## 2019-07-13 DIAGNOSIS — Z87891 Personal history of nicotine dependence: Secondary | ICD-10-CM | POA: Diagnosis not present

## 2019-07-13 DIAGNOSIS — I5032 Chronic diastolic (congestive) heart failure: Secondary | ICD-10-CM | POA: Diagnosis not present

## 2019-07-13 DIAGNOSIS — Z7901 Long term (current) use of anticoagulants: Secondary | ICD-10-CM | POA: Diagnosis not present

## 2019-07-13 DIAGNOSIS — I428 Other cardiomyopathies: Secondary | ICD-10-CM

## 2019-07-13 DIAGNOSIS — E785 Hyperlipidemia, unspecified: Secondary | ICD-10-CM | POA: Insufficient documentation

## 2019-07-13 DIAGNOSIS — I129 Hypertensive chronic kidney disease with stage 1 through stage 4 chronic kidney disease, or unspecified chronic kidney disease: Secondary | ICD-10-CM | POA: Diagnosis present

## 2019-07-13 DIAGNOSIS — E7800 Pure hypercholesterolemia, unspecified: Secondary | ICD-10-CM | POA: Diagnosis not present

## 2019-07-13 DIAGNOSIS — Z955 Presence of coronary angioplasty implant and graft: Secondary | ICD-10-CM | POA: Diagnosis not present

## 2019-07-13 DIAGNOSIS — I2510 Atherosclerotic heart disease of native coronary artery without angina pectoris: Secondary | ICD-10-CM | POA: Diagnosis not present

## 2019-07-13 DIAGNOSIS — Z794 Long term (current) use of insulin: Secondary | ICD-10-CM | POA: Diagnosis not present

## 2019-07-13 DIAGNOSIS — I480 Paroxysmal atrial fibrillation: Secondary | ICD-10-CM | POA: Diagnosis not present

## 2019-07-13 DIAGNOSIS — E1122 Type 2 diabetes mellitus with diabetic chronic kidney disease: Secondary | ICD-10-CM | POA: Insufficient documentation

## 2019-07-13 DIAGNOSIS — I10 Essential (primary) hypertension: Secondary | ICD-10-CM

## 2019-07-13 DIAGNOSIS — G4733 Obstructive sleep apnea (adult) (pediatric): Secondary | ICD-10-CM | POA: Diagnosis not present

## 2019-07-13 DIAGNOSIS — Z79899 Other long term (current) drug therapy: Secondary | ICD-10-CM | POA: Diagnosis not present

## 2019-07-13 DIAGNOSIS — N189 Chronic kidney disease, unspecified: Secondary | ICD-10-CM | POA: Insufficient documentation

## 2019-07-13 LAB — BASIC METABOLIC PANEL
Anion gap: 10 (ref 5–15)
BUN: 15 mg/dL (ref 8–23)
CO2: 21 mmol/L — ABNORMAL LOW (ref 22–32)
Calcium: 9.4 mg/dL (ref 8.9–10.3)
Chloride: 108 mmol/L (ref 98–111)
Creatinine, Ser: 1.33 mg/dL — ABNORMAL HIGH (ref 0.61–1.24)
GFR calc Af Amer: >60 mL/min (ref >60)
GFR calc non Af Amer: 56 mL/min — ABNORMAL LOW (ref >60)
Glucose, Bld: 166 mg/dL — ABNORMAL HIGH (ref 70–99)
Potassium: 3.5 mmol/L (ref 3.5–5.1)
Sodium: 139 mmol/L (ref 135–145)

## 2019-07-13 LAB — CBC
HCT: 39.6 % (ref 39.0–52.0)
Hemoglobin: 11.3 g/dL — ABNORMAL LOW (ref 13.0–17.0)
MCH: 22.1 pg — ABNORMAL LOW (ref 26.0–34.0)
MCHC: 28.5 g/dL — ABNORMAL LOW (ref 30.0–36.0)
MCV: 77.5 fL — ABNORMAL LOW (ref 80.0–100.0)
Platelets: 294 10*3/uL (ref 150–400)
RBC: 5.11 MIL/uL (ref 4.22–5.81)
RDW: 23 % — ABNORMAL HIGH (ref 11.5–15.5)
WBC: 4.8 10*3/uL (ref 4.0–10.5)
nRBC: 0 % (ref 0.0–0.2)

## 2019-07-13 MED ORDER — POTASSIUM CHLORIDE CRYS ER 10 MEQ PO TBCR: 20.0000 meq | EXTENDED_RELEASE_TABLET | Freq: Every day | ORAL | 3 refills | Status: DC

## 2019-07-13 NOTE — Progress Notes (Signed)
Patient ID: [name] [name], male   DOB: 1955/09/04, 63 y.o.   MRN: [id number] PCP: Dr. Volanda Napoleon  Cardiology: Dr. Aundra Dubin  63 y.o. with history of HTN, diabetes, paroxysmal atrial fibrillation, and CAD presents for cardiology followup.  He was admitted in 5/13 with chest pain with exertion.  LHC was done showing global HK with EF 45% and diffuse, severe distal and branch vessel disease.  There was no interventional option, but I suspect that this disease could be causing his anginal-type pain.  Echo at that time was read as showing EF 60-65%.  He was admitted in 5/14 with hypertensive urgency and chest pain.  He ruled out for MI and BP was controlled.  His EF was 50-55% by echo.  ETT-Sestamibi done as outpatient showed small fixed inferior defect with no ischemia but EF was 31%.  He was quite hypertensive during the study.  Cardiac MRI done to confirm EF in 5/14 showed EF 44%.  He had an episode of atrial fibrillation/RVR in 3/15 but went back into NSR.  Echo in 2/16 showed EF 55-60% with moderate diastolic dysfunction.   He had a Cardiolite in 9/16 showing inferior ischemia.  He was having exertional dyspnea with less activity than normal.  I was concerned for worsened CAD, so took him for [facility] in 9/16. By RHC, filling pressures were not significantly elevated. He had a severe distal RCA stenosis that was treated with DES x 2.  Echo showed EF 55-60%.    In 10/16, he was admitted with acute cholecystitis.  He received a cholecystostomy tube.  Cholecystectomy done in 6/17.   Atrial fibrillation in 1/17, cardioverted to NSR.   Recurrent concerning chest pain in 3/17.  Repeat LHC showed diffuse distal and branch vessel disease consistent with poorly-controlled DM, but patent RCA stents.    He was admitted with atrial fibrillation in 12/17.  Started on sotalol and converted to NSR.    He had recurrent chest pain in 5/18 with abnormal Cardiolite.  Cath was done again in 5/18, showing diffuse CAD  with no good interventional options. He was given a prescription for Imdur but does not appear to have ever started it.  Admitted11/2020 with increased dyspnea and exertional chest pain. Hgb went down to 8.1. GI consulted.  Had [place] that showed severe distal and branch vessel disease but no interventional targets and not good anatomy for CABG. Continued on medical management. Capsule Endoscopy without recent or active bleeding, no AVMs. He was placed on  eliquis 5 mg twice a day. He went into A flutter during his hospitalization. Anticoagulants held due to possible GI bleed therefore elective cardioversion was going to be considered as an outpatient.    Today he returns for HF follow up.Overall feeling fine. Denies SOB/PND/Orthopnea. No bleeding issues. Appetite ok. No fever or chills. Weight at home has been stable. Taking all medications. His daughters prepare his pill box.   Labs (5/13): K 3.6, creatinine 0.93, LDL 85, HDL 57 Labs (7/13): K 3.9, creatinine 1.1, LDL 57, HDL 51  Labs (5/14): K 4, creatinine 1.25 Labs (8/14): K 3.7, creatinine 0.9, LDL 103, HDL 42 Labs (8/15): K 3.5 creatinine 1.0, BNP 75 Labs (2/16): K 3.8, creatinine 1.1 Labs (9/16): K 3.5, creatinine 1.03, BNP 59, HDL 40, LDL 111, TGs 333 Labs (11/16): K 3.6, creaitnine 1.07, LFTs normal, LDL 44 Labs (6/17): K 3.4, creatinine 1.52 => 1.13 Labs (1/18): K 3.9, creatinine 1.25 Labs (6/18): K 3.8, creatinine 1.21, HCT 39.7 Labs (8/18):  LDL 39, HDL 43 Labs (12/18): 4.4, creatinine 1.25, LFTs normal Labs (8/19): K 3.6, creatinine 1.13, LDL 68, HDL 49  PMH: 1. DM2 2. Gout 3. HTN: Cough with ACEI.  4. Cardiomyopathy: Suspect mixed ischemic/nonischemic (?ETOH-related).  EF 35% in 2008.  Echo in 5/13 showed EF 60-65% with moderate LVH but EF was 45% on LV-gram in 5/13.  Echo (5/14) with EF 50-55%, mild LVH, inferobasal HK, mild MR.  ETT-Sestamibi in 5/14, however, showed EF 31%.  Cardiac MRI (6/14) with EF 44%, mild global  hypokinesis, subepicardial delayed enhancement in a nonspecific RV insertion site pattern.  Echo (2/16) with EF 55-60%, moderate diastolic dysfunction. RHC (9/16) with mean RA 8, PA 39/20 mean 28, mean PCWP 14, CI 2.2. Echo (9/16) with EF 55-60%, grade II diastolic dysfunction.  - Echo (6/17) with EF 55-60%, moderate LVH, grade II diastolic dysfunction, mild AI, mild MR.  5. CAD: LHC in 2010 with mild nonobstructive disease.  LHC (5/13) with diffuse distal and branch vessel disease, mild global hypokinesis and EF 45%.  ETT-Sestamibi (5/14): EF 31%, small fixed inferior defect with no ischemia.  Lexiscan Cardiolite (9/16) with EF 26%, moderate inferior defect that was partially reversible, suggesting ischemia => High risk study. LHC (9/16) with 40-50% mLAD, diffuse up to 50% distal LAD, 90% ostium of branch off ramus, 95% dRCA => DES to RCA x 2, EF 55-60%.   - LHC (3/17) with diffuse branch and distal vessel disease c/w poorly controlled DM, RCA stents patent.  - LHC (5/18): Diffuse CAD with no good interventional option. 60% mRCA, 90% dLAD, 50% serial mid ramus stenoses, ramus branches with ostial 80-90% stenoses. -LHC 06/2019: Severe distal and branch vessel disease but no interventional targets and not good anatomy for CABG - Continue eliquis 5 mg twice a day.  6. H/o chronic HBV 7. Osteochondroma left shoulder.  8. Hyperlipidemia 9. Paroxysmal atrial fibrillation: Coumadin.  Developed cough and increased ESR with amiodarone.  DCCV in 1/17.  Recurrent atrial fibrillation in 12/17, sotalol started.  10. Atrial flutter: had ablations in 2007 and 2008.  11. OSA on CPAP.  12. Acute cholecystitis (10/16): Cholecystostomy tube placed.  Cholecystectomy 6/17.  13. CKD 14. Anemia  -Suspected GI bleed but recent EGD/c-scope 9/20 without findings.  -Capsule Endoscopy without recent or active bleeding, no AVMs.   SH: Married, prior smoker.  Some ETOH, occasionally heavy in past.  Does use occasional  marijuana. Out of work Engineer, drilling. 2 daughters in grad school.   FH: CAD  ROS: All systems reviewed and negative except as per HPI.   Current Outpatient Medications  Medication Sig Dispense Refill   ACCU-CHEK SOFTCLIX LANCETS lancets Use as instructed for three times daily testing of blood glucose 100 each 12   acetaminophen (TYLENOL) 500 MG tablet Take 1,000 mg by mouth every 6 (six) hours as needed (pain).     amLODipine (NORVASC) 5 MG tablet Take 1 tablet (5 mg total) by mouth daily. 90 tablet 1   apixaban (ELIQUIS) 5 MG TABS tablet Take 1 tablet (5 mg total) by mouth 2 (two) times daily. 60 tablet 0   atorvastatin (LIPITOR) 80 MG tablet Take 1 tablet (80 mg total) by mouth daily. 90 tablet 1   Blood Glucose Monitoring Suppl (ACCU-CHEK AVIVA PLUS) w/Device KIT Use as directed for 3 times daily testing of blood glucose 1 kit 0   carvedilol (COREG) 25 MG tablet Take 1 tablet (25 mg total) by mouth 2 (two) times daily with a meal. 180  tablet 1   furosemide (LASIX) 40 MG tablet Take 1 tablet (40 mg total) by mouth 2 (two) times daily. 60 tablet 0   glucose blood (ACCU-CHEK AVIVA PLUS) test strip Use as instructed for 3 times daily testing of blood glucose 100 each 12   guaiFENesin (MUCINEX PO) Take 1 tablet by mouth 2 (two) times daily as needed (cough/congestion).     hydrALAZINE (APRESOLINE) 25 MG tablet Take 3 tablets (75 mg total) by mouth 2 (two) times daily. 180 tablet 0   Insulin Glargine (LANTUS SOLOSTAR) 100 UNIT/ML Solostar Pen Inject 35 Units into the skin every morning. And pen needles 1/day (Patient taking differently: Inject 35 Units into the skin daily after breakfast. ) 30 mL 6   isosorbide mononitrate (IMDUR) 120 MG 24 hr tablet Take 1 tablet (120 mg total) by mouth daily. 30 tablet 6   Lancet Devices (ACCU-CHEK SOFTCLIX) lancets Use as instructed for 3 times daily testing of blood glucose 1 each 0   metFORMIN (GLUCOPHAGE) 1000 MG tablet Take 1 tablet (1,000 mg  total) by mouth 2 (two) times daily with a meal. 180 tablet 1   Multiple Vitamin (MULTIVITAMIN WITH MINERALS) TABS tablet Take 1 tablet by mouth daily. 30 tablet 0   nitroGLYCERIN (NITROSTAT) 0.4 MG SL tablet Place 1 tablet (0.4 mg total) under the tongue every 5 (five) minutes as needed for chest pain (x 3 tabs daily). (Patient taking differently: Place 0.4 mg under the tongue every 5 (five) minutes x 3 doses as needed for chest pain. ) 60 tablet 1   pantoprazole (PROTONIX) 40 MG tablet Take 1 tablet (40 mg total) by mouth daily. 30 tablet 1   sotalol (BETAPACE) 120 MG tablet TAKE 1 TABLET BY MOUTH EVERY 12 HOURS (Patient taking differently: Take 120 mg by mouth every 12 (twelve) hours. ) 180 tablet 0   TRUEPLUS LANCETS 28G MISC 1 each by Does not apply route 3 (three) times daily. 100 each 12   No current facility-administered medications for this encounter.     BP 131/60    Pulse 82    Wt 92 kg (202 lb 12.8 oz)    SpO2 99%    BMI 27.50 kg/m   Wt Readings from Last 3 Encounters:  07/13/19 92 kg (202 lb 12.8 oz)  07/02/19 91.2 kg (201 lb 1 oz)  05/27/19 91.2 kg (201 lb)    General:  Well appearing. No resp difficulty HEENT: normal Neck: supple. no JVD. Carotids 2+ bilat; no bruits. No lymphadenopathy or thryomegaly appreciated. Cor: PMI nondisplaced. Regular rate & rhythm. No rubs, gallops or murmurs. Lungs: clear Abdomen: soft, nontender, nondistended. No hepatosplenomegaly. No bruits or masses. Good bowel sounds. Extremities: no cyanosis, clubbing, rash, edema Neuro: alert & orientedx3, cranial nerves grossly intact. moves all 4 extremities w/o difficulty. Affect pleasant  EKG: NSR 75 bpm Qtc 478 ms   Assessment/Plan:  1. Atrial fibrillation Paroxysmal atrial fibrillation. In NSR today.   - Intolerant xarelto due to bleeding. Continue eliquis 5 mg twice a day.  - Continue sotalol for maintenance of NSR.  QTc ok on today's ECG.  - Check CBC today.   -  2. Coronary artery  disease LHC in 9/16 showed diffuse CAD, most significant involving the distal RCA.  Patient had DES x 2 to RCA.  Cath in 3/17 with patent RCA stents and diffuse distal and branch vessel disease concerning for poorly controlled diabetes.  Cath in 5/18 again showed diffuse CAD without good interventional target.  06/2019 severe distal and branch vessel disease but no interventional targets and not good anatomy for CABG.  - No chest pain today.  - Continue eliquis 5 mg twice a day.  - Continue atorvastatin 80 mg daily, good lipids 06/2019   3. HYPERCHOLESTEROLEMIA  On atorvastatin, good lipids 06/2019   4. Cardiomyopathy  EF 47% on cardiac MRI in 6/14 but EF improved back to 55-60% on 2/16 echo.  However, EF down to 26% on Cardiolite in 9/16. - Echo in 9/20 showed EF 60-65%, moderate LVH, normal RV, mild-moderate MR -NYHA II. Volume status stable. Continue lasix 40 mg twice a day. Add back 20 meq potassium daily.  - Continue carvedilol 25 mg twice a day.     5. Hypertension Stable. Continue current regimen.   6. OSA He was intolerant CPAP.  7. CKD Check BMET   8. Anemia  -Suspected GI bleed but recent EGD/c-scope 9/20 without findings.  -Capsule Endoscopy without recent or active bleeding, no AVMs.  - Continue protonix.   No cardioversion needed. Continue current regimen. Check CBC, BMET today.   Follow in 8 weeks with Dr Aundra Dubin. EKG at that time.    Bretton Tandy NP-C  07/13/2019

## 2019-07-13 NOTE — Patient Instructions (Signed)
RESTART Potassium 10 meq (two tabs daily)  Labs today We will only contact you if something comes back abnormal or we need to make some changes. Otherwise no news is good news!  Your physician recommends that you schedule a follow-up appointment in: 8 weeks with Dr Aundra Dubin  Do the following things EVERYDAY: 1) Weigh yourself in the morning before breakfast. Write it down and keep it in a log. 2) Take your medicines as prescribed 3) Eat low salt foods-Limit salt (sodium) to 2000 mg per day.  4) Stay as active as you can everyday 5) Limit all fluids for the day to less than 2 liters  At the [facility], you and your health needs are our priority. As part of our continuing mission to provide you with exceptional heart care, we have created designated Provider Care Teams. These Care Teams include your primary Cardiologist (physician) and Advanced Practice Providers (APPs- Physician Assistants and Nurse Practitioners) who all work together to provide you with the care you need, when you need it.   You may see any of the following providers on your designated Care Team at your next follow up: [name] Dr Glori Bickers . Dr Loralie Champagne . Darrick Grinder, NP . Lyda Jester, PA   Please be sure to bring in all your medications bottles to every appointment.

## 2019-07-14 ENCOUNTER — Encounter (HOSPITAL_COMMUNITY): Payer: Medicare Other

## 2019-07-14 ENCOUNTER — Encounter (HOSPITAL_COMMUNITY): Payer: Self-pay

## 2019-07-14 NOTE — Progress Notes (Signed)
Clinical review of pt follow up appt on 11/30 with [name] Grinder, NP - cardiologist office note. Pt is making the expected progress in recovery.  Pt appropriate for scheduling for on site cardiac rehab and/or enrollment in [facility].  Pt Covid score is 6.  Will forward to staff for follow up. Cecil Vandyke E. Laray Anger, BSN

## 2019-07-16 ENCOUNTER — Other Ambulatory Visit: Payer: Self-pay | Admitting: Family Medicine

## 2019-07-16 DIAGNOSIS — I480 Paroxysmal atrial fibrillation: Secondary | ICD-10-CM

## 2019-07-16 DIAGNOSIS — I5032 Chronic diastolic (congestive) heart failure: Secondary | ICD-10-CM

## 2019-07-16 DIAGNOSIS — E785 Hyperlipidemia, unspecified: Secondary | ICD-10-CM

## 2019-07-16 DIAGNOSIS — I2510 Atherosclerotic heart disease of native coronary artery without angina pectoris: Secondary | ICD-10-CM

## 2019-07-17 ENCOUNTER — Other Ambulatory Visit (HOSPITAL_COMMUNITY): Payer: Self-pay

## 2019-07-17 DIAGNOSIS — E785 Hyperlipidemia, unspecified: Secondary | ICD-10-CM

## 2019-07-17 MED ORDER — ATORVASTATIN CALCIUM 80 MG PO TABS: 80.0000 mg | ORAL_TABLET | Freq: Every day | ORAL | 1 refills | Status: DC

## 2019-07-27 ENCOUNTER — Ambulatory Visit (HOSPITAL_COMMUNITY): Admit: 2019-07-27 | Payer: Medicare Other | Admitting: Cardiology

## 2019-07-27 ENCOUNTER — Encounter (HOSPITAL_COMMUNITY): Payer: Self-pay

## 2019-07-27 SURGERY — CARDIOVERSION
Anesthesia: General

## 2019-07-30 ENCOUNTER — Other Ambulatory Visit (HOSPITAL_COMMUNITY): Payer: Self-pay | Admitting: Cardiology

## 2019-07-30 ENCOUNTER — Other Ambulatory Visit: Payer: Self-pay | Admitting: Family Medicine

## 2019-07-30 DIAGNOSIS — I5032 Chronic diastolic (congestive) heart failure: Secondary | ICD-10-CM

## 2019-07-30 DIAGNOSIS — I2510 Atherosclerotic heart disease of native coronary artery without angina pectoris: Secondary | ICD-10-CM

## 2019-07-30 DIAGNOSIS — I480 Paroxysmal atrial fibrillation: Secondary | ICD-10-CM

## 2019-07-31 ENCOUNTER — Other Ambulatory Visit (HOSPITAL_COMMUNITY): Payer: Self-pay | Admitting: Cardiology

## 2019-07-31 ENCOUNTER — Other Ambulatory Visit: Payer: Self-pay | Admitting: Family Medicine

## 2019-07-31 DIAGNOSIS — I5032 Chronic diastolic (congestive) heart failure: Secondary | ICD-10-CM

## 2019-07-31 DIAGNOSIS — I480 Paroxysmal atrial fibrillation: Secondary | ICD-10-CM

## 2019-07-31 DIAGNOSIS — I2510 Atherosclerotic heart disease of native coronary artery without angina pectoris: Secondary | ICD-10-CM

## 2019-07-31 MED ORDER — CARVEDILOL 25 MG PO TABS: 25.0000 mg | ORAL_TABLET | Freq: Two times a day (BID) | ORAL | 1 refills | Status: DC

## 2019-07-31 MED ORDER — APIXABAN 5 MG PO TABS: 5.0000 mg | ORAL_TABLET | Freq: Two times a day (BID) | ORAL | 3 refills | Status: DC

## 2019-08-04 ENCOUNTER — Encounter (HOSPITAL_COMMUNITY): Payer: Self-pay

## 2019-08-13 ENCOUNTER — Other Ambulatory Visit: Payer: Self-pay

## 2019-08-13 ENCOUNTER — Ambulatory Visit (INDEPENDENT_AMBULATORY_CARE_PROVIDER_SITE_OTHER): Payer: Medicare Other | Admitting: Family Medicine

## 2019-08-13 ENCOUNTER — Encounter: Payer: Self-pay | Admitting: Family Medicine

## 2019-08-13 VITALS — BP 130/70 | HR 86 | Temp 97.6°F | Wt 202.0 lb

## 2019-08-13 DIAGNOSIS — I5032 Chronic diastolic (congestive) heart failure: Secondary | ICD-10-CM

## 2019-08-13 DIAGNOSIS — D500 Iron deficiency anemia secondary to blood loss (chronic): Secondary | ICD-10-CM | POA: Diagnosis not present

## 2019-08-13 DIAGNOSIS — I10 Essential (primary) hypertension: Secondary | ICD-10-CM

## 2019-08-13 DIAGNOSIS — R413 Other amnesia: Secondary | ICD-10-CM

## 2019-08-13 NOTE — Progress Notes (Signed)
Subjective:    Patient ID: [name] [name], male    DOB: 23-May-1956, 63 y.o.   MRN: [id number]  No chief complaint on file.   HPI Patient was seen today for f/u.  Pt hospitalized 11/13-11/19 for exertional CP due to iron def anemia and less likely cardiac.  H/H 9.4/32.8 on admission, went as low as 8.1.  Given IV iron 11/14.  Hospitalized in Sept for anemia.  Seen by [facility].  Pt underwent capsule endoscopy 11/16 which was unremarkable.  Situation complicated by Xarelto use in setting of atrial flutter.  Atrial flutter controlled on sotalol and carvedilol.  Future cardioversion discussed.  Heart cath revealed vessel disease, though not amenable to CABG.  Acute on chronic CHF improved with diuresis.   Since d/c pt states he has been feeling good.  Pt denies SOB, LE edema, HAs, bleeding.  Does note an episode of dizziness while at the grocery store.  The dizziness resolved after sitting in his car.  Pt is unsure of the events surrounding the episode.  Patient has been unable to check BP as he needs a new battery for his cuff.  Pt endorses drinking plenty of water and limiting sodium intake.  Patient had follow-up cardiology.  Is to start cardiac rehab in January.  Pt notes not being able to walk as far as he has in the past.  Pt also notes concern about memory.  States forgetting little things such as items to get from grocery store or if he saw the Gastroenterologist since d/c.  Pt denies being able to pay bills or driving without getting lost.  Using a pill organizer but may forget to take evening dose of medication.   Past Medical History:  Diagnosis Date  . Anginal pain ([place])   . Anxiety   . Arrhythmia   . CAD (coronary artery disease), native coronary artery    a. Nonobstructive CAD by cath 2013 - diffuse distal and branch vessel CAD, no severe disease in the major coronaries, LV mild global hypokinesis, EF 45%. b. ETT-Sestamibi 5/14: EF 31%, small fixed inferior defect with no ischemia.   . Cholecystitis   . Chronic CHF ([place])    a. Mixed ICM/NICM (?EtOH). EF 35% in 2008. Echo 5/13: EF 60-65%, mod LVH, EF 45% on V gram in 12/2011. EF 12/2012: EF 50-55%, mild LVH, inferobasal HK, mild MR. ETT-Ses 5/14 EF 41%. Cardiac MRI 5/14: EF 44%, mild global HK, subepicardial delayed enhancement in nonspecific RV insertion pattern.  . COLONIC POLYPS, HX OF 12/30/2006  . Gout   . H/O atrial flutter 2007   a. Ablations in 2007, 2008.  [name] Heart murmur   . HEPATITIS B, CHRONIC 12/30/2006  . History of alcohol abuse   . History of hiatal hernia   . History of medication noncompliance   . History of nuclear stress test    Myoview 3/17: + chest pain; EF 33%, downsloping ST depression 2, V4-6, LVH with strain, no ischemia on images; intermediate risk   . HYPERCHOLESTEROLEMIA 07/11/2010  . Hypertension   . Left sciatic nerve pain since 04/2015  . LIVER FUNCTION TESTS, ABNORMAL, HX OF 12/30/2006  . MITRAL REGURGITATION 12/30/2006  . 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 PAF (paroxysmal atrial fibrillation) (HCC)    On coumadin  . Rectal bleeding 12/18/2011   Scheduled for colonoscopy.    . Shortness of breath dyspnea    with excertion  . Sleep apnea    "suppose to send mask  but they never did" (05/03/2015)  . Type II diabetes mellitus (HCC)     Allergies  Allergen Reactions  . Shrimp [Shellfish Allergy] Nausea And Vomiting  . Ace Inhibitors Cough  . Other Hives and Other (See Comments)    Patient reports developing hives after receiving "some antibiotic given in 1980''s [facility]". He does not know which antibiotic.    ROS General: Denies fever, chills, night sweats, changes in weight, changes in appetite  +memory concern, dizziness-resolved HEENT: Denies headaches, ear pain, changes in vision, rhinorrhea, sore throat CV: Denies CP, palpitations, SOB, orthopnea Pulm: Denies SOB, cough, wheezing GI: Denies abdominal pain, nausea, vomiting, diarrhea,  constipation GU: Denies dysuria, hematuria, frequency, vaginal discharge Msk: Denies muscle cramps, joint pains Neuro: Denies weakness, numbness, tingling Skin: Denies rashes, bruising Psych: Denies depression, anxiety, hallucinations     Objective:    Blood pressure 130/70, pulse 86, temperature 97.6 F (36.4 C), temperature source Temporal, weight 202 lb (91.6 kg), SpO2 98 %.   Gen. Pleasant, well-nourished, in no distress, normal affect   HEENT: [place]/AT, face symmetric, conjunctiva clear, no scleral icterus, PERRLA, EOMI, nares patent without drainage Lungs: no accessory muscle use, CTAB, no wheezes or rales Cardiovascular: RRR, no m/r/g, no peripheral edema Musculoskeletal: No deformities, no cyanosis or clubbing, normal tone Neuro:  A&Ox3, CN II-XII intact, normal gait Skin:  Warm, no lesions/ rash  MMSE: score 29.  Forgot one of the items in the 3 word recall. Able to draw a clock appropriately.  Able to name 11 words that start with the letter "F" without stopping.  Wt Readings from Last 3 Encounters:  08/13/19 202 lb (91.6 kg)  07/13/19 202 lb 12.8 oz (92 kg)  07/02/19 201 lb 1 oz (91.2 kg)    Lab Results  Component Value Date   WBC 4.8 07/13/2019   HGB 11.3 (L) 07/13/2019   HCT 39.6 07/13/2019   PLT 294 07/13/2019   GLUCOSE 166 (H) 07/13/2019   CHOL 81 06/30/2019   TRIG 127 06/30/2019   HDL 31 (L) 06/30/2019   LDLDIRECT 111.0 04/22/2015   LDLCALC 25 06/30/2019   ALT 19 06/27/2019   AST 14 (L) 06/27/2019   NA 139 07/13/2019   K 3.5 07/13/2019   CL 108 07/13/2019   CREATININE 1.33 (H) 07/13/2019   BUN 15 07/13/2019   CO2 21 (L) 07/13/2019   TSH 0.808 06/26/2019   PSA 0.91 03/30/2010   INR 1.2 04/24/2019   HGBA1C 6.6 (H) 06/27/2019   MICROALBUR 2.5 (H) 01/26/2016    Assessment/Plan:  Iron deficiency anemia due to chronic blood loss -Capsule endoscopy did not reveal source of bleeding -Last hemoglobin stable at 11.3 on 07/13/2019 -Continue  multivitamin with iron -Patient encouraged to continue follow-up with gastroenterology  Essential hypertension -Controlled -Continue current medications: Norvasc 5 mg, Coreg 25 mg twice daily, Lasix 40 mg twice daily, hydralazine 75 mg twice daily, sotalol 120 mg twice daily -Continue lifestyle modifications -Patient encouraged to obtain battery for BP cuff to monitor at home  Chronic diastolic CHF (congestive heart failure) (HCC) -stable.  Euvolemic -Continue current occasions including Norvasc 5 mg, Coreg 25 mg twice daily, Lasix 40 mg twice daily, hydralazine 25 mg twice daily, sotalol 120 mg twice daily, Imdur 120 mg daily -Discussed sodium and fluid restrictions -Monitor daily weights -Continue follow-up with cardiology -Patient encouraged to start cardiac rehab  Memory deficit -MMSE successfully completed this visit.  Score 29. -Discussed neuropsych testing and imaging of brain.  Patient  wishes to wait at this time. -Discussed memory exercises  F/u in 1 month  Grier Mitts, MD

## 2019-08-13 NOTE — Patient Instructions (Signed)
Heart Failure and Exercise Heart failure is a condition in which the heart does not fill or pump enough blood and oxygen to support your body and its functions. Heart failure is a long-term (chronic) condition. Living with heart failure can be challenging. However, following your health care provider's instructions about a healthy lifestyle may help improve your symptoms. This includes choosing the right exercise plan. Doing daily physical activity is important after a diagnosis of heart failure. You may have some activity restrictions, so talk to your health care provider before doing any exercises. What are the benefits of exercise? Exercise may:  Make your heart muscles stronger.  Lower your blood pressure.  Lower your cholesterol.  Help you lose weight.  Help your bones stay strong.  Improve your blood circulation.  Help your body use oxygen better. This relieves symptoms such as fatigue and shortness of breath.  Help your mental health by lowering the risk of depression and other problems.  Improve your quality of life.  Decrease your chance of hospital admission for heart failure. What is an exercise plan? An exercise plan is a set of specific exercises and training activities. You will work with your health care provider to create the exercise plan that works for you. The plan may include:  Different types of exercises and how to do them.  Cardiac rehabilitation exercises. These are supervised programs that are designed to strengthen your heart. What are strengthening exercises? Strengthening exercises are a type of physical activity that involves using resistance to improve your muscle strength. Strengthening exercises usually have repetitive motions. These types of exercises can include:  Lifting weights.  Using weight machines.  Using resistance tubes and bands.  Using kettlebells.  Using your body weight, such as doing push-ups or squats. What are balance  exercises? Balance exercises are another type of physical activity. They strengthen the muscles of the back, stomach, and pelvis (core muscles) and improve your balance. They can also lower your risk of falling. These types of exercises can include:  Standing on one leg.  Walking backward, sideways, and in a straight line.  Standing up after sitting, without using your hands.  Shifting your weight from one leg to the other.  Lifting one leg in front of you.  Doing tai chi. This is a type of exercise that uses slow movements and deep breathing. How can I increase my flexibility? Having better flexibility can keep you from falling. It can also lengthen your muscles, improve your range of motion, and help your joints. You can increase your flexibility by:  Doing tai chi.  Doing yoga.  Stretching. How much aerobic exercise should I get?  Aerobic exercises strengthen your breathing and circulation system and increase your body's use of oxygen. Examples of aerobic exercise include biking, walking, running, and swimming. Talk to your health care provider to find out how much aerobic exercise is safe for you.  To do these exercises:  Start exercising slowly, limiting the amount of time at first. You may need to start with 5 minutes of aerobic exercise every day.  Slowly add more minutes until you can safely do at least 30 minutes of exercise at least 4 days a week. Summary  Daily physical activity is important after a diagnosis of heart failure.  Exercise can make your heart muscles stronger. It also offers other benefits that will improve your health.  Talk to your health care provider before doing any exercises. This information is not intended to replace advice  given to you by your health care provider. Make sure you discuss any questions you have with your health care provider. Document Revised: 12/14/2016 Document Reviewed: 12/11/2016 Elsevier Patient Education  [place].  Iron Deficiency Anemia, Adult Iron-deficiency anemia is when you have a low amount of red blood cells or hemoglobin. This happens because you have too little iron in your body. Hemoglobin carries oxygen to parts of the body. Anemia can cause your body to not get enough oxygen. It may or may not cause symptoms. Follow these instructions at home: Medicines  Take over-the-counter and prescription medicines only as told by your doctor. This includes iron pills (supplements) and vitamins.  If you cannot handle taking iron pills by mouth, ask your doctor about getting iron through: ? A vein (intravenously). ? A shot (injection) into a muscle.  Take iron pills when your stomach is empty. If you cannot handle this, take them with food.  Do not drink milk or take antacids at the same time as your iron pills.  To prevent trouble pooping (constipation), eat fiber or take medicine (stool softener) as told by your doctor. Eating and drinking   Talk with your doctor before changing the foods you eat. He or she may tell you to eat foods that have a lot of iron, such as: ? Liver. ? Lowfat (lean) beef. ? Breads and cereals that have iron added to them (fortified breads and cereals). ? Eggs. ? Dried fruit. ? Dark green, leafy vegetables.  Drink enough fluid to keep your pee (urine) clear or pale yellow.  Eat fresh fruits and vegetables that are high in vitamin C. They help your body to use iron. Foods with a lot of vitamin C include: ? Oranges. ? Peppers. ? Tomatoes. ? Mangoes. General instructions  Return to your normal activities as told by your doctor. Ask your doctor what activities are safe for you.  Keep yourself clean, and keep things clean around you (your surroundings). Anemia can make you get sick more easily.  Keep all follow-up visits as told by your doctor. This is important. Contact a doctor if:  You feel sick to your stomach (nauseous).  You throw up (vomit).  You  feel weak.  You are sweating for no clear reason.  You have trouble pooping, such as: ? Pooping (having a bowel movement) less than 3 times a week. ? Straining to poop. ? Having poop that is hard, dry, or larger than normal. ? Feeling full or bloated. ? Pain in the lower belly. ? Not feeling better after pooping. Get help right away if:  You pass out (faint). If this happens, do not drive yourself to the hospital. Call your local emergency services (911 in the [place]  You have chest pain.  You have shortness of breath that: ? Is very bad. ? Gets worse with physical activity.  You have a fast heartbeat.  You get light-headed when getting up from sitting or lying down. This information is not intended to replace advice given to you by your health care provider. Make sure you discuss any questions you have with your health care provider. Document Revised: 07/12/2017 Document Reviewed: 04/18/2016 Elsevier Patient Education  [place].

## 2019-08-18 ENCOUNTER — Encounter (HOSPITAL_COMMUNITY): Payer: Self-pay | Admitting: Cardiology

## 2019-08-18 ENCOUNTER — Ambulatory Visit (HOSPITAL_COMMUNITY)
Admission: RE | Admit: 2019-08-18 | Discharge: 2019-08-18 | Disposition: A | Discharge status: Home / Self Care | Payer: Medicare Other | Source: Ambulatory Visit | Attending: Cardiology | Admitting: Cardiology

## 2019-08-18 ENCOUNTER — Other Ambulatory Visit: Payer: Self-pay

## 2019-08-18 VITALS — BP 115/63 | HR 78 | Wt 208.0 lb

## 2019-08-18 DIAGNOSIS — I480 Paroxysmal atrial fibrillation: Secondary | ICD-10-CM | POA: Diagnosis not present

## 2019-08-18 DIAGNOSIS — G4733 Obstructive sleep apnea (adult) (pediatric): Secondary | ICD-10-CM | POA: Insufficient documentation

## 2019-08-18 DIAGNOSIS — E785 Hyperlipidemia, unspecified: Secondary | ICD-10-CM | POA: Insufficient documentation

## 2019-08-18 DIAGNOSIS — Z8249 Family history of ischemic heart disease and other diseases of the circulatory system: Secondary | ICD-10-CM | POA: Diagnosis not present

## 2019-08-18 DIAGNOSIS — I25119 Atherosclerotic heart disease of native coronary artery with unspecified angina pectoris: Secondary | ICD-10-CM | POA: Diagnosis not present

## 2019-08-18 DIAGNOSIS — Z79899 Other long term (current) drug therapy: Secondary | ICD-10-CM | POA: Insufficient documentation

## 2019-08-18 DIAGNOSIS — Z955 Presence of coronary angioplasty implant and graft: Secondary | ICD-10-CM | POA: Diagnosis not present

## 2019-08-18 DIAGNOSIS — I428 Other cardiomyopathies: Secondary | ICD-10-CM

## 2019-08-18 DIAGNOSIS — Z7901 Long term (current) use of anticoagulants: Secondary | ICD-10-CM | POA: Diagnosis not present

## 2019-08-18 DIAGNOSIS — Z794 Long term (current) use of insulin: Secondary | ICD-10-CM | POA: Diagnosis not present

## 2019-08-18 DIAGNOSIS — Z87891 Personal history of nicotine dependence: Secondary | ICD-10-CM | POA: Insufficient documentation

## 2019-08-18 DIAGNOSIS — I429 Cardiomyopathy, unspecified: Secondary | ICD-10-CM | POA: Insufficient documentation

## 2019-08-18 DIAGNOSIS — I4892 Unspecified atrial flutter: Secondary | ICD-10-CM | POA: Diagnosis not present

## 2019-08-18 DIAGNOSIS — D649 Anemia, unspecified: Secondary | ICD-10-CM | POA: Diagnosis not present

## 2019-08-18 DIAGNOSIS — I10 Essential (primary) hypertension: Secondary | ICD-10-CM

## 2019-08-18 DIAGNOSIS — I130 Hypertensive heart and chronic kidney disease with heart failure and stage 1 through stage 4 chronic kidney disease, or unspecified chronic kidney disease: Secondary | ICD-10-CM | POA: Insufficient documentation

## 2019-08-18 DIAGNOSIS — E1122 Type 2 diabetes mellitus with diabetic chronic kidney disease: Secondary | ICD-10-CM | POA: Insufficient documentation

## 2019-08-18 DIAGNOSIS — I5032 Chronic diastolic (congestive) heart failure: Secondary | ICD-10-CM

## 2019-08-18 DIAGNOSIS — N189 Chronic kidney disease, unspecified: Secondary | ICD-10-CM | POA: Insufficient documentation

## 2019-08-18 LAB — CBC
HCT: 39.7 % (ref 39.0–52.0)
Hemoglobin: 11.6 g/dL — ABNORMAL LOW (ref 13.0–17.0)
MCH: 21.1 pg — ABNORMAL LOW (ref 26.0–34.0)
MCHC: 29.2 g/dL — ABNORMAL LOW (ref 30.0–36.0)
MCV: 72.2 fL — ABNORMAL LOW (ref 80.0–100.0)
Platelets: 268 10*3/uL (ref 150–400)
RBC: 5.5 MIL/uL (ref 4.22–5.81)
RDW: 21.9 % — ABNORMAL HIGH (ref 11.5–15.5)
WBC: 6.9 10*3/uL (ref 4.0–10.5)
nRBC: 0 % (ref 0.0–0.2)

## 2019-08-18 LAB — BASIC METABOLIC PANEL
Anion gap: 10 (ref 5–15)
BUN: 15 mg/dL (ref 8–23)
CO2: 23 mmol/L (ref 22–32)
Calcium: 9.6 mg/dL (ref 8.9–10.3)
Chloride: 106 mmol/L (ref 98–111)
Creatinine, Ser: 1.14 mg/dL (ref 0.61–1.24)
GFR calc Af Amer: >60 mL/min (ref >60)
GFR calc non Af Amer: >60 mL/min (ref >60)
Glucose, Bld: 144 mg/dL — ABNORMAL HIGH (ref 70–99)
Potassium: 4 mmol/L (ref 3.5–5.1)
Sodium: 139 mmol/L (ref 135–145)

## 2019-08-18 NOTE — Progress Notes (Signed)
Patient ID: [name] [name], male   DOB: 11-20-1955, 64 y.o.   MRN: [id number] PCP: Dr. Volanda Napoleon  Cardiology: Dr. Aundra Dubin  64 y.o. with history of HTN, diabetes, paroxysmal atrial fibrillation, and CAD presents for cardiology followup.  He was admitted in 5/13 with chest pain with exertion.  LHC was done showing global HK with EF 45% and diffuse, severe distal and branch vessel disease.  There was no interventional option, but I suspect that this disease could be causing his anginal-type pain.  Echo at that time was read as showing EF 60-65%.  He was admitted in 5/14 with hypertensive urgency and chest pain.  He ruled out for MI and BP was controlled.  His EF was 50-55% by echo.  ETT-Sestamibi done as outpatient showed small fixed inferior defect with no ischemia but EF was 31%.  He was quite hypertensive during the study.  Cardiac MRI done to confirm EF in 5/14 showed EF 44%.  He had an episode of atrial fibrillation/RVR in 3/15 but went back into NSR.  Echo in 2/16 showed EF 55-60% with moderate diastolic dysfunction.   He had a Cardiolite in 9/16 showing inferior ischemia.  He was having exertional dyspnea with less activity than normal.  I was concerned for worsened CAD, so took him for [facility] in 9/16. By RHC, filling pressures were not significantly elevated. He had a severe distal RCA stenosis that was treated with DES x 2.  Echo showed EF 55-60%.    In 10/16, he was admitted with acute cholecystitis.  He received a cholecystostomy tube.  Cholecystectomy done in 6/17.   Atrial fibrillation in 1/17, cardioverted to NSR.   Recurrent concerning chest pain in 3/17.  Repeat LHC showed diffuse distal and branch vessel disease consistent with poorly-controlled DM, but patent RCA stents.    He was admitted with atrial fibrillation in 12/17.  Started on sotalol and converted to NSR.    He had recurrent chest pain in 5/18 with abnormal Cardiolite.  Cath was done again in 5/18, showing diffuse CAD  with no good interventional options.   Admitted11/2020 with increased dyspnea and exertional chest pain. Hgb went down to 8.1. GI consulted.  Had [place] that showed severe distal and branch vessel disease but no interventional targets and not good anatomy for CABG. Continued on medical management. Capsule Endoscopy without recent or active bleeding, no AVMs. He went into atrial flutter during his hospitalization. He went back into NSR, however, after discharge.     Today he returns for followup of CHF.  He is in NSR today.  No BRBPR, melena.  He is short of breath after walking about 100 yards.  No orthopnea/PND.  No lightheadedness.  No chest pain.   Labs (5/13): K 3.6, creatinine 0.93, LDL 85, HDL 57 Labs (7/13): K 3.9, creatinine 1.1, LDL 57, HDL 51  Labs (5/14): K 4, creatinine 1.25 Labs (8/14): K 3.7, creatinine 0.9, LDL 103, HDL 42 Labs (8/15): K 3.5 creatinine 1.0, BNP 75 Labs (2/16): K 3.8, creatinine 1.1 Labs (9/16): K 3.5, creatinine 1.03, BNP 59, HDL 40, LDL 111, TGs 333 Labs (11/16): K 3.6, creaitnine 1.07, LFTs normal, LDL 44 Labs (6/17): K 3.4, creatinine 1.52 => 1.13 Labs (1/18): K 3.9, creatinine 1.25 Labs (6/18): K 3.8, creatinine 1.21, HCT 39.7 Labs (8/18): LDL 39, HDL 43 Labs (12/18): 4.4, creatinine 1.25, LFTs normal Labs (8/19): K 3.6, creatinine 1.13, LDL 68, HDL 49  Labs (11/20): K 3.5, creatinine 1.33, LDL 25  ECG (  personally reviewed): NSR, QTc 470 msec  PMH: 1. DM2 2. Gout 3. HTN: Cough with ACEI.  4. Cardiomyopathy: Suspect mixed ischemic/nonischemic (?ETOH-related).  EF 35% in 2008.  Echo in 5/13 showed EF 60-65% with moderate LVH but EF was 45% on LV-gram in 5/13.  Echo (5/14) with EF 50-55%, mild LVH, inferobasal HK, mild MR.  ETT-Sestamibi in 5/14, however, showed EF 31%.  Cardiac MRI (6/14) with EF 44%, mild global hypokinesis, subepicardial delayed enhancement in a nonspecific RV insertion site pattern.  Echo (2/16) with EF 55-60%, moderate diastolic  dysfunction. RHC (9/16) with mean RA 8, PA 39/20 mean 28, mean PCWP 14, CI 2.2. Echo (9/16) with EF 55-60%, grade II diastolic dysfunction.  - Echo (6/17) with EF 55-60%, moderate LVH, grade II diastolic dysfunction, mild AI, mild MR.  - RHC (11/20): mean RA 4, PA 46/14 mean 26, mean PCWP 13, CI 3.23 - Echo (9/20): EF 60-65%, moderate LVH, normal RV size and systolic function, mild-moderate MR, mild AI.  5. CAD: LHC in 2010 with mild nonobstructive disease.  LHC (5/13) with diffuse distal and branch vessel disease, mild global hypokinesis and EF 45%.  ETT-Sestamibi (5/14): EF 31%, small fixed inferior defect with no ischemia.  Lexiscan Cardiolite (9/16) with EF 26%, moderate inferior defect that was partially reversible, suggesting ischemia => High risk study. LHC (9/16) with 40-50% mLAD, diffuse up to 50% distal LAD, 90% ostium of branch off ramus, 95% dRCA => DES to RCA x 2, EF 55-60%.   - LHC (3/17) with diffuse branch and distal vessel disease c/w poorly controlled DM, RCA stents patent.  - LHC (5/18): Diffuse CAD with no good interventional option. 60% mRCA, 90% dLAD, 50% serial mid ramus stenoses, ramus branches with ostial 80-90% stenoses. - LHC (11/20): Severe distal and branch vessel disease but no interventional targets and not good anatomy for CABG  6. H/o chronic HBV 7. Osteochondroma left shoulder.  8. Hyperlipidemia 9. Paroxysmal atrial fibrillation: Coumadin.  Developed cough and increased ESR with amiodarone.  DCCV in 1/17.  Recurrent atrial fibrillation in 12/17, sotalol started.  10. Atrial flutter: had ablations in 2007 and 2008.  11. OSA on CPAP.  12. Acute cholecystitis (10/16): Cholecystostomy tube placed.  Cholecystectomy 6/17.  13. CKD 14. Anemia - Suspected GI bleed but EGD/c-scope 9/20 without significant findings.  - Capsule Endoscopy (11/20) without recent or active bleeding, no AVMs.   SH: Married, prior smoker.  Some ETOH, occasionally heavy in past.  Does use  occasional marijuana. Out of work Engineer, drilling. 2 daughters in grad school.   FH: CAD  ROS: All systems reviewed and negative except as per HPI.   Current Outpatient Medications  Medication Sig Dispense Refill  . ACCU-CHEK SOFTCLIX LANCETS lancets Use as instructed for three times daily testing of blood glucose 100 each 12  . acetaminophen (TYLENOL) 500 MG tablet Take 1,000 mg by mouth every 6 (six) hours as needed (pain).    [name] amLODipine (NORVASC) 5 MG tablet Take 1 tablet (5 mg total) by mouth daily. 90 tablet 1  . apixaban (ELIQUIS) 5 MG TABS tablet Take 1 tablet (5 mg total) by mouth 2 (two) times daily. 60 tablet 3  . atorvastatin (LIPITOR) 80 MG tablet Take 1 tablet (80 mg total) by mouth daily. 90 tablet 1  . Blood Glucose Monitoring Suppl (ACCU-CHEK AVIVA PLUS) w/Device KIT Use as directed for 3 times daily testing of blood glucose 1 kit 0  . carvedilol (COREG) 25 MG tablet Take 1 tablet (  25 mg total) by mouth 2 (two) times daily with a meal. 180 tablet 1  . furosemide (LASIX) 40 MG tablet Take 1 tablet (40 mg total) by mouth 2 (two) times daily. 60 tablet 0  . glucose blood (ACCU-CHEK AVIVA PLUS) test strip Use as instructed for 3 times daily testing of blood glucose 100 each 12  . guaiFENesin (MUCINEX PO) Take 1 tablet by mouth 2 (two) times daily as needed (cough/congestion).    . hydrALAZINE (APRESOLINE) 25 MG tablet Take 3 tablets (75 mg total) by mouth 2 (two) times daily. 180 tablet 0  . Insulin Glargine (LANTUS SOLOSTAR) 100 UNIT/ML Solostar Pen Inject 35 Units into the skin every morning. And pen needles 1/day (Patient taking differently: Inject 35 Units into the skin daily after breakfast. ) 30 mL 6  . isosorbide mononitrate (IMDUR) 120 MG 24 hr tablet Take 1 tablet (120 mg total) by mouth daily. 30 tablet 6  . Lancet Devices (ACCU-CHEK SOFTCLIX) lancets Use as instructed for 3 times daily testing of blood glucose 1 each 0  . metFORMIN (GLUCOPHAGE) 1000 MG tablet Take 1 tablet  (1,000 mg total) by mouth 2 (two) times daily with a meal. 180 tablet 1  . nitroGLYCERIN (NITROSTAT) 0.4 MG SL tablet Place 1 tablet (0.4 mg total) under the tongue every 5 (five) minutes as needed for chest pain (x 3 tabs daily). (Patient taking differently: Place 0.4 mg under the tongue every 5 (five) minutes x 3 doses as needed for chest pain. ) 60 tablet 1  . pantoprazole (PROTONIX) 40 MG tablet Take 1 tablet (40 mg total) by mouth daily. 30 tablet 1  . potassium chloride SA (KLOR-CON) 10 MEQ tablet Take 2 tablets (20 mEq total) by mouth daily. 60 tablet 3  . sotalol (BETAPACE) 120 MG tablet TAKE 1 TABLET BY MOUTH EVERY 12 HOURS (Patient taking differently: Take 120 mg by mouth every 12 (twelve) hours. ) 180 tablet 0  . TRUEPLUS LANCETS 28G MISC 1 each by Does not apply route 3 (three) times daily. 100 each 12   No current facility-administered medications for this encounter.    BP 115/63   Pulse 78   Wt 94.3 kg (208 lb)   SpO2 99%   BMI 28.21 kg/m   Wt Readings from Last 3 Encounters:  08/18/19 94.3 kg (208 lb)  08/13/19 91.6 kg (202 lb)  07/13/19 92 kg (202 lb 12.8 oz)   General: NAD Neck: No JVD, no thyromegaly or thyroid nodule.  Lungs: Clear to auscultation bilaterally with normal respiratory effort. CV: Nondisplaced PMI.  Heart regular S1/S2, no S3/S4, no murmur.  No peripheral edema.  No carotid bruit.  Normal pedal pulses.  Abdomen: Soft, nontender, no hepatosplenomegaly, no distention.  Skin: Intact without lesions or rashes.  Neurologic: Alert and oriented x 3.  Psych: Normal affect. Extremities: No clubbing or cyanosis.  HEENT: Normal.   Assessment/Plan:  1. Atrial fibrillation/flutter: Paroxysmal. In NSR today.   - Continue Eliquis 5 mg twice a day.  No overt GI bleeding.  - Continue sotalol for maintenance of NSR.  QTc ok on today's ECG.  - Check CBC today.   2. Coronary artery disease: LHC in 9/16 showed diffuse CAD, most significant involving the distal RCA.   Patient had DES x 2 to RCA.  Cath in 3/17 with patent RCA stents and diffuse distal and branch vessel disease concerning for poorly controlled diabetes.  Cath in 5/18 again showed diffuse CAD without good interventional target.  Cath in 11/20 showed severe distal and branch vessel disease but no interventional targets and not good anatomy for CABG.  He has not had recent chest pain.  - No ASA given Eliquis use.  - Continue atorvastatin 80 mg daily, good lipids 06/2019  - Continue Coreg, amlodipine, and Imdur 120 mg daily for angina control.  3. Hyperlipidemia:  On atorvastatin, good lipids 06/2019  4. Chronic diastolic CHF: EF 30% on cardiac MRI in 6/14 but EF improved back to 55-60% on 2/16 echo.  However, EF down to 26% on Cardiolite in 9/16.  Echo in 9/20 showed EF 60-65%, moderate LVH, normal RV, mild-moderate MR.  NYHA class II symptoms, he is not volume overloaded on exam.  - Continue lasix 40 mg twice a day. BMET today.  5. Hypertension: BP controlled. Continue current regimen.   6. OSA: He was intolerant CPAP. 7. CKD: Check BMET  8. Anemia: Check CBC today.  No overt bleeding.    Followup in 4 months. Loralie Champagne  08/18/2019

## 2019-08-18 NOTE — Patient Instructions (Signed)
Labs today We will only contact you if something comes back abnormal or we need to make some changes. Otherwise no news is good news!  Your physician recommends that you schedule a follow-up appointment in: 4 months with Dr  Aundra Dubin  Please call office at [phone] option [phone] if you have any questions or concerns.   At the [facility], you and your health needs are our priority. As part of our continuing mission to provide you with exceptional heart care, we have created designated Provider Care Teams. These Care Teams include your primary Cardiologist (physician) and Advanced Practice Providers (APPs- Physician Assistants and Nurse Practitioners) who all work together to provide you with the care you need, when you need it.   You may see any of the following providers on your designated Care Team at your next follow up: [name] Dr Glori Bickers . Dr Loralie Champagne . Darrick Grinder, NP . Lyda Jester, PA . Audry Riles, PharmD   Please be sure to bring in all your medications bottles to every appointment.

## 2019-08-19 ENCOUNTER — Telehealth (HOSPITAL_COMMUNITY): Payer: Self-pay

## 2019-08-19 NOTE — Telephone Encounter (Signed)
Successful telephone encounter to [name] to confirm his in-person Cardiac Rehab orientation appointment for tomorrow, 08/20/2019 at 0800. Patient states he will be present for appointment. CR RN attempted to complete brief health history however patient request call back later today as this is not a good time.   Plan: Will call patient back this pm to complete brief health history  Zoua Caporaso E. Laray Anger, BSN

## 2019-08-19 NOTE — Telephone Encounter (Signed)
Unsuccessful telephone encounter to patient as previously discussed to complete brief health history prior to tomorrows cardiac rehab orientation appointment. HIPAA compliant VM message left requesting call back.  Budd Freiermuth E. Laray Anger, BSN

## 2019-08-19 NOTE — Telephone Encounter (Signed)
Cardiac Rehab Medication Review by a Pharmacist  Does the patient  feel that his/her medications are working for him/her?  yes  Has the patient been experiencing any side effects to the medications prescribed?  no  Does the patient measure his/her own blood pressure or blood glucose at home?  yes   Does the patient have any problems obtaining medications due to transportation or finances?   yes  Understanding of regimen: excellent Understanding of indications: excellent Potential of compliance: fair, patient has not been taking his KCl on a regular bases.    Pharmacist comments: Patient needs help getting another blood pressure monitor.   Acey Lav, PharmD  PGY1 Acute Care Pharmacy Resident [phone] 08/19/2019 5:56 PM

## 2019-08-20 ENCOUNTER — Other Ambulatory Visit: Payer: Self-pay

## 2019-08-20 ENCOUNTER — Encounter (HOSPITAL_COMMUNITY)
Admission: RE | Admit: 2019-08-20 | Discharge: 2019-08-20 | Disposition: A | Discharge status: Home / Self Care | Payer: Medicare Other | Source: Ambulatory Visit | Attending: Cardiology | Admitting: Cardiology

## 2019-08-20 ENCOUNTER — Encounter (HOSPITAL_COMMUNITY): Payer: Self-pay

## 2019-08-20 VITALS — BP 128/70 | HR 70 | Temp 97.0°F | Ht 70.5 in | Wt 204.8 lb

## 2019-08-20 DIAGNOSIS — I110 Hypertensive heart disease with heart failure: Secondary | ICD-10-CM | POA: Diagnosis not present

## 2019-08-20 DIAGNOSIS — I208 Other forms of angina pectoris: Secondary | ICD-10-CM

## 2019-08-20 DIAGNOSIS — E7800 Pure hypercholesterolemia, unspecified: Secondary | ICD-10-CM | POA: Diagnosis not present

## 2019-08-20 DIAGNOSIS — G4730 Sleep apnea, unspecified: Secondary | ICD-10-CM | POA: Diagnosis not present

## 2019-08-20 DIAGNOSIS — Z794 Long term (current) use of insulin: Secondary | ICD-10-CM | POA: Insufficient documentation

## 2019-08-20 DIAGNOSIS — I4891 Unspecified atrial fibrillation: Secondary | ICD-10-CM | POA: Insufficient documentation

## 2019-08-20 DIAGNOSIS — I509 Heart failure, unspecified: Secondary | ICD-10-CM | POA: Diagnosis not present

## 2019-08-20 DIAGNOSIS — I25118 Atherosclerotic heart disease of native coronary artery with other forms of angina pectoris: Secondary | ICD-10-CM | POA: Insufficient documentation

## 2019-08-20 DIAGNOSIS — E119 Type 2 diabetes mellitus without complications: Secondary | ICD-10-CM | POA: Diagnosis not present

## 2019-08-20 DIAGNOSIS — Z7901 Long term (current) use of anticoagulants: Secondary | ICD-10-CM | POA: Diagnosis not present

## 2019-08-20 DIAGNOSIS — Z79899 Other long term (current) drug therapy: Secondary | ICD-10-CM | POA: Diagnosis not present

## 2019-08-20 DIAGNOSIS — Z87891 Personal history of nicotine dependence: Secondary | ICD-10-CM | POA: Diagnosis not present

## 2019-08-20 LAB — GLUCOSE, CAPILLARY: Glucose-Capillary: 162 mg/dL — ABNORMAL HIGH (ref 70–99)

## 2019-08-20 NOTE — Progress Notes (Signed)
I have reviewed a Home Exercise Prescription with Loura Back . Kasin is not currently exercising at home. The patient was advised to walk 3 days a week for 10 minutes.  Berneta Sages and I discussed how to progress their exercise prescription.  The patient stated that their goals were to increase his strength and stamina. The patient stated that they understand the exercise prescription.  We reviewed exercise guidelines, target heart rate during exercise, RPE Scale, weather conditions, NTG use, endpoints for exercise, warmup and cool down. Patient is encouraged to come to me with any questions. I will continue to follow up with the patient to assist them with progression and safety.     Carma Lair MS, ACSM CEP 3:11 PM 08/20/2019

## 2019-08-20 NOTE — Progress Notes (Signed)
Pt is interested in participating in Virtual Cardiac and Pulmonary Rehab. Pt advised that Virtual Cardiac and Pulmonary Rehab is provided at no cost to the patient.  Checklist:  1. Pt has smart device  ie smartphone and/or ipad for downloading an app  Yes 2. Reliable internet/wifi service    Yes 3. Understands how to use their smartphone and navigate within an app.  Yes   Pt verbalized understanding and is in agreement.        Confirm Consent - In the setting of the current Covid19 crisis, you are scheduled for a phone visit with your Cardiac or Pulmonary team member.  Just as we do with many in-gym visits, in order for you to participate in this visit, we must obtain consent.  If you'd like, I can send this to your mychart (if signed up) or email for you to review.  Otherwise, I can obtain your verbal consent now.  By agreeing to a telephone visit, we'd like you to understand that the technology does not allow for your Cardiac or Pulmonary Rehab team member to perform a physical assessment, and thus may limit their ability to fully assess your ability to perform exercise programs. If your provider identifies any concerns that need to be evaluated in person, we will make arrangements to do so.  Finally, though the technology is pretty good, we cannot assure that it will always work on either your or our end and we cannot ensure that we have a secure connection.  Cardiac and Pulmonary Rehab Telehealth visits and "At Home" cardiac and pulmonary rehab are provided at no cost to you.        Are you willing to proceed?"        STAFF: Did the patient verbally acknowledge consent to telehealth visit? Document YES/NO here: Yes     Barnet Pall RN  Cardiac and Pulmonary Rehab Staff        Date 08/20/19    @ Time 1502

## 2019-08-20 NOTE — Progress Notes (Addendum)
Cardiac Individual Treatment Plan  Patient Details  Name: Noha Karasik MRN: [id number] Date of Birth: 05/05/1956 Referring Provider:     CARDIAC REHAB PHASE II ORIENTATION from 08/20/2019 in [place]  Referring Provider  Loralie Champagne MD      Initial Encounter Date:    CARDIAC REHAB PHASE II ORIENTATION from 08/20/2019 in [place]  Date  08/20/19      Visit Diagnosis: Stable angina ([place])  Patient's Home Medications on Admission:  Current Outpatient Medications:  .  ACCU-CHEK SOFTCLIX LANCETS lancets, Use as instructed for three times daily testing of blood glucose, Disp: 100 each, Rfl: 12 .  acetaminophen (TYLENOL) 500 MG tablet, Take 1,000 mg by mouth every 6 (six) hours as needed (pain)., Disp: , Rfl:  .  amLODipine (NORVASC) 5 MG tablet, Take 1 tablet (5 mg total) by mouth daily., Disp: 90 tablet, Rfl: 1 .  apixaban (ELIQUIS) 5 MG TABS tablet, Take 1 tablet (5 mg total) by mouth 2 (two) times daily., Disp: 60 tablet, Rfl: 3 .  atorvastatin (LIPITOR) 80 MG tablet, Take 1 tablet (80 mg total) by mouth daily., Disp: 90 tablet, Rfl: 1 .  Blood Glucose Monitoring Suppl (ACCU-CHEK AVIVA PLUS) w/Device KIT, Use as directed for 3 times daily testing of blood glucose, Disp: 1 kit, Rfl: 0 .  carvedilol (COREG) 25 MG tablet, Take 1 tablet (25 mg total) by mouth 2 (two) times daily with a meal., Disp: 180 tablet, Rfl: 1 .  furosemide (LASIX) 40 MG tablet, Take 1 tablet (40 mg total) by mouth 2 (two) times daily., Disp: 60 tablet, Rfl: 0 .  glucose blood (ACCU-CHEK AVIVA PLUS) test strip, Use as instructed for 3 times daily testing of blood glucose, Disp: 100 each, Rfl: 12 .  guaiFENesin (MUCINEX PO), Take 1 tablet by mouth 2 (two) times daily as needed (cough/congestion)., Disp: , Rfl:  .  Insulin Glargine (LANTUS SOLOSTAR) 100 UNIT/ML Solostar Pen, Inject 35 Units into the skin every morning. And pen needles 1/day  (Patient taking differently: Inject 35 Units into the skin daily after breakfast. ), Disp: 30 mL, Rfl: 6 .  Lancet Devices (ACCU-CHEK SOFTCLIX) lancets, Use as instructed for 3 times daily testing of blood glucose, Disp: 1 each, Rfl: 0 .  metFORMIN (GLUCOPHAGE) 1000 MG tablet, Take 1 tablet (1,000 mg total) by mouth 2 (two) times daily with a meal., Disp: 180 tablet, Rfl: 1 .  nitroGLYCERIN (NITROSTAT) 0.4 MG SL tablet, Place 1 tablet (0.4 mg total) under the tongue every 5 (five) minutes as needed for chest pain (x 3 tabs daily). (Patient taking differently: Place 0.4 mg under the tongue every 5 (five) minutes x 3 doses as needed for chest pain. ), Disp: 60 tablet, Rfl: 1 .  pantoprazole (PROTONIX) 40 MG tablet, Take 1 tablet (40 mg total) by mouth daily., Disp: 30 tablet, Rfl: 1 .  potassium chloride SA (KLOR-CON) 10 MEQ tablet, Take 2 tablets (20 mEq total) by mouth daily., Disp: 60 tablet, Rfl: 3 .  sotalol (BETAPACE) 120 MG tablet, TAKE 1 TABLET BY MOUTH EVERY 12 HOURS (Patient taking differently: Take 120 mg by mouth every 12 (twelve) hours. ), Disp: 180 tablet, Rfl: 0 .  TRUEPLUS LANCETS 28G MISC, 1 each by Does not apply route 3 (three) times daily., Disp: 100 each, Rfl: 12 .  hydrALAZINE (APRESOLINE) 25 MG tablet, Take 3 tablets (75 mg total) by mouth 2 (two) times daily., Disp: 180 tablet,  Rfl: 0 .  isosorbide mononitrate (IMDUR) 120 MG 24 hr tablet, Take 1 tablet (120 mg total) by mouth daily., Disp: 30 tablet, Rfl: 6  Past Medical History: Past Medical History:  Diagnosis Date  . Anginal pain ([place])   . Anxiety   . Arrhythmia   . CAD (coronary artery disease), native coronary artery    a. Nonobstructive CAD by cath 2013 - diffuse distal and branch vessel CAD, no severe disease in the major coronaries, LV mild global hypokinesis, EF 45%. b. ETT-Sestamibi 5/14: EF 31%, small fixed inferior defect with no ischemia.  . Cholecystitis   . Chronic CHF ([place])    a. Mixed ICM/NICM (?EtOH). EF  35% in 2008. Echo 5/13: EF 60-65%, mod LVH, EF 45% on V gram in 12/2011. EF 12/2012: EF 50-55%, mild LVH, inferobasal HK, mild MR. ETT-Ses 5/14 EF 41%. Cardiac MRI 5/14: EF 44%, mild global HK, subepicardial delayed enhancement in nonspecific RV insertion pattern.  . COLONIC POLYPS, HX OF 12/30/2006  . Gout   . H/O atrial flutter 2007   a. Ablations in 2007, 2008.  [name] Heart murmur   . HEPATITIS B, CHRONIC 12/30/2006  . History of alcohol abuse   . History of hiatal hernia   . History of medication noncompliance   . History of nuclear stress test    Myoview 3/17: + chest pain; EF 33%, downsloping ST depression 2, V4-6, LVH with strain, no ischemia on images; intermediate risk   . HYPERCHOLESTEROLEMIA 07/11/2010  . Hypertension   . Left sciatic nerve pain since 04/2015  . LIVER FUNCTION TESTS, ABNORMAL, HX OF 12/30/2006  . MITRAL REGURGITATION 12/30/2006  . 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 PAF (paroxysmal atrial fibrillation) ([facility])    On coumadin  . Rectal bleeding 12/18/2011   Scheduled for colonoscopy.    . Shortness of breath dyspnea    with excertion  . Sleep apnea    "suppose to send mask but they never did" (05/03/2015)  . Type II diabetes mellitus ([facility])     Tobacco Use: Social History   Tobacco Use  Smoking Status Former Smoker  . Packs/day: 4.00  . Years: 18.00  . Pack years: 72.00  . Types: Cigarettes  . Quit date: 08/14/1983  . Years since quitting: 36.0  Smokeless Tobacco Never Used    Labs: Recent Chemical engineer    Labs for ITP Cardiac and Pulmonary Rehab Latest Ref Rng & Units 05/27/2019 06/27/2019 06/30/2019 07/01/2019 07/01/2019   Cholestrol 0 - 200 mg/dL 116 - 81 - -   LDLCALC 0 - 99 mg/dL 44 - 25 - -   LDLDIRECT mg/dL - - - - -   HDL >40 mg/dL 49.50 - 31(L) - -   Trlycerides <150 mg/dL 117.0 62 127 - -   Hemoglobin A1c 4.8 - 5.6 % - 6.6(H) - - -   PHART 7.350 - 7.450 - - - - -   PCO2ART 35.0 - 45.0 mmHg - - - - -   HCO3 20.0 - 28.0  mmol/L - - - 24.5 23.8   TCO2 22 - 32 mmol/L - - - 26 25   ACIDBASEDEF 0.0 - 2.0 mmol/L - - - - 1.0   O2SAT % - - - 65.0 67.0      Capillary Blood Glucose: Lab Results  Component Value Date   GLUCAP 162 (H) 08/20/2019   GLUCAP 139 (H) 07/02/2019   GLUCAP 92 07/02/2019   GLUCAP 108 (H) 07/01/2019  GLUCAP 151 (H) 07/01/2019     Exercise Target Goals: Exercise Program Goal: Individual exercise prescription set using results from initial 6 min walk test and THRR while considering  patient's activity barriers and safety.   Exercise Prescription Goal: Starting with aerobic activity 30 plus minutes a day, 3 days per week for initial exercise prescription. Provide home exercise prescription and guidelines that participant acknowledges understanding prior to discharge.  Activity Barriers & Risk Stratification: Activity Barriers & Cardiac Risk Stratification - 08/20/19 1420      Activity Barriers & Cardiac Risk Stratification   Activity Barriers  Back Problems;Deconditioning;Muscular Weakness;Shortness of Breath    Cardiac Risk Stratification  High       6 Minute Walk: 6 Minute Walk    Row Name 08/20/19 1417         6 Minute Walk   Phase  Initial     Distance  1662 feet     Walk Time  6 minutes     # of Rest Breaks  0     MPH  3.15     METS  3.9     RPE  12     Perceived Dyspnea   0     VO2 Peak  13.5     Symptoms  No     Resting HR  70 bpm     Resting BP  128/70     Resting Oxygen Saturation   97 %     Exercise Oxygen Saturation  during 6 min walk  96 %     Max Ex. HR  103 bpm     Max Ex. BP  140/80     2 Minute Post BP  126/74        Oxygen Initial Assessment:   Oxygen Re-Evaluation:   Oxygen Discharge (Final Oxygen Re-Evaluation):   Initial Exercise Prescription: Initial Exercise Prescription - 08/20/19 1400      Date of Initial Exercise RX and Referring Provider   Date  08/20/19    Referring Provider  Loralie Champagne MD      Track   Minutes  10       Prescription Details   Frequency (times per week)  3x    Duration  Progress to 30 minutes of continuous aerobic without signs/symptoms of physical distress      Intensity   THRR 40-80% of Max Heartrate  62-124    Ratings of Perceived Exertion  11-13    Perceived Dyspnea  0-4      Progression   Progression  Continue progressive overload as per policy without signs/symptoms or physical distress.      Resistance Training   Training Prescription  No       Perform Capillary Blood Glucose checks as needed.  Exercise Prescription Changes:   Exercise Comments:   Exercise Goals and Review:  Exercise Goals    Row Name 08/20/19 1420             Exercise Goals   Increase Physical Activity  Yes       Intervention  Provide advice, education, support and counseling about physical activity/exercise needs.;Develop an individualized exercise prescription for aerobic and resistive training based on initial evaluation findings, risk stratification, comorbidities and participant's personal goals.       Expected Outcomes  Short Term: Attend rehab on a regular basis to increase amount of physical activity.;Long Term: Add in home exercise to make exercise part of routine and to increase amount of physical activity.;Long  Term: Exercising regularly at least 3-5 days a week.       Increase Strength and Stamina  Yes       Intervention  Provide advice, education, support and counseling about physical activity/exercise needs.;Develop an individualized exercise prescription for aerobic and resistive training based on initial evaluation findings, risk stratification, comorbidities and participant's personal goals.       Expected Outcomes  Short Term: Increase workloads from initial exercise prescription for resistance, speed, and METs.;Short Term: Perform resistance training exercises routinely during rehab and add in resistance training at home;Long Term: Improve cardiorespiratory fitness, muscular  endurance and strength as measured by increased METs and functional capacity (6MWT)       Able to understand and use rate of perceived exertion (RPE) scale  Yes       Intervention  Provide education and explanation on how to use RPE scale       Expected Outcomes  Short Term: Able to use RPE daily in rehab to express subjective intensity level;Long Term:  Able to use RPE to guide intensity level when exercising independently       Knowledge and understanding of Target Heart Rate Range (THRR)  Yes       Intervention  Provide education and explanation of THRR including how the numbers were predicted and where they are located for reference       Expected Outcomes  Short Term: Able to state/look up THRR;Long Term: Able to use THRR to govern intensity when exercising independently;Short Term: Able to use daily as guideline for intensity in rehab       Able to check pulse independently  Yes       Intervention  Provide education and demonstration on how to check pulse in carotid and radial arteries.;Review the importance of being able to check your own pulse for safety during independent exercise       Expected Outcomes  Short Term: Able to explain why pulse checking is important during independent exercise;Long Term: Able to check pulse independently and accurately       Understanding of Exercise Prescription  Yes       Intervention  Provide education, explanation, and written materials on patient's individual exercise prescription       Expected Outcomes  Short Term: Able to explain program exercise prescription;Long Term: Able to explain home exercise prescription to exercise independently          Exercise Goals Re-Evaluation :    Discharge Exercise Prescription (Final Exercise Prescription Changes):   Nutrition:  Target Goals: Understanding of nutrition guidelines, daily intake of sodium <1543m, cholesterol <2033m calories 30% from fat and 7% or less from saturated fats, daily to have 5 or  more servings of fruits and vegetables.  Biometrics: Pre Biometrics - 08/20/19 1410      Pre Biometrics   Height  5' 10.5" (1.791 m)    Weight  92.9 kg    Waist Circumference  44 inches    Hip Circumference  42 inches    Waist to Hip Ratio  1.05 %    BMI (Calculated)  28.96    Triceps Skinfold  6 mm    % Body Fat  28.9 %    Grip Strength  47 kg    Flexibility  14 in    Single Leg Stand  5.81 seconds        Nutrition Therapy Plan and Nutrition Goals:   Nutrition Assessments:   Nutrition Goals Re-Evaluation:   Nutrition Goals  Discharge (Final Nutrition Goals Re-Evaluation):   Psychosocial: Target Goals: Acknowledge presence or absence of significant depression and/or stress, maximize coping skills, provide positive support system. Participant is able to verbalize types and ability to use techniques and skills needed for reducing stress and depression.  Initial Review & Psychosocial Screening: Initial Psych Review & Screening - 08/20/19 1448      Initial Review   Current issues with  None Identified      Family Dynamics   Good Support System?  Yes   Tien has his children for support who live in town     Barriers   Psychosocial barriers to participate in program  There are no identifiable barriers or psychosocial needs.      Screening Interventions   Interventions  Encouraged to exercise       Quality of Life Scores: Quality of Life - 08/20/19 1412      Quality of Life   Select  Quality of Life      Quality of Life Scores   Health/Function Pre  17.93 %    Socioeconomic Pre  19.36 %    Psych/Spiritual Pre  19.57 %    Family Pre  25 %    GLOBAL Pre  19.6 %      Scores of 19 and below usually indicate a poorer quality of life in these areas.  A difference of  2-3 points is a clinically meaningful difference.  A difference of 2-3 points in the total score of the Quality of Life Index has been associated with significant improvement in overall quality of  life, self-image, physical symptoms, and general health in studies assessing change in quality of life.  PHQ-9: Recent Review Flowsheet Data    Depression screen [facility] 2/9 08/20/2019 09/29/2018 07/08/2018 04/07/2018 01/28/2018   Decreased Interest 0 1 0 1 1   Down, Depressed, Hopeless 0 0 0 0 0   PHQ - 2 Score 0 1 0 1 1   Altered sleeping - '2 2 1 1   ' Tired, decreased energy - '1 2 1 1   ' Change in appetite - '1 1 1 ' 0   Feeling bad or failure about yourself  - 0 1 1 0   Trouble concentrating - 0 1 1 0   Moving slowly or fidgety/restless - 0 0 0 0   Suicidal thoughts - 0 0 0 0   PHQ-9 Score - '5 7 6 3     ' Interpretation of Total Score  Total Score Depression Severity:  1-4 = Minimal depression, 5-9 = Mild depression, 10-14 = Moderate depression, 15-19 = Moderately severe depression, 20-27 = Severe depression   Psychosocial Evaluation and Intervention:   Psychosocial Re-Evaluation:   Psychosocial Discharge (Final Psychosocial Re-Evaluation):   Vocational Rehabilitation: Provide vocational rehab assistance to qualifying candidates.   Vocational Rehab Evaluation & Intervention: Vocational Rehab - 08/20/19 1449      Initial Vocational Rehab Evaluation & Intervention   Assessment shows need for Vocational Rehabilitation  No   Darris is diabled and does not need vocational rehab at this time      Education: Education Goals: Education classes will be provided on a weekly basis, covering required topics. Participant will state understanding/return demonstration of topics presented.  Learning Barriers/Preferences: Learning Barriers/Preferences - 08/20/19 1415      Learning Barriers/Preferences   Learning Barriers  None    Learning Preferences  Written Material;Audio;Verbal Instruction       Education Topics: Hypertension, Hypertension Reduction -Define heart disease  and high blood pressure. Discus how high blood pressure affects the body and ways to reduce high blood  pressure.   Exercise and Your Heart -Discuss why it is important to exercise, the FITT principles of exercise, normal and abnormal responses to exercise, and how to exercise safely.   Angina -Discuss definition of angina, causes of angina, treatment of angina, and how to decrease risk of having angina.   Cardiac Medications -Review what the following cardiac medications are used for, how they affect the body, and side effects that may occur when taking the medications.  Medications include Aspirin, Beta blockers, calcium channel blockers, ACE Inhibitors, angiotensin receptor blockers, diuretics, digoxin, and antihyperlipidemics.   Congestive Heart Failure -Discuss the definition of CHF, how to live with CHF, the signs and symptoms of CHF, and how keep track of weight and sodium intake.   Heart Disease and Intimacy -Discus the effect sexual activity has on the heart, how changes occur during intimacy as we age, and safety during sexual activity.   Smoking Cessation / COPD -Discuss different methods to quit smoking, the health benefits of quitting smoking, and the definition of COPD.   Nutrition I: Fats -Discuss the types of cholesterol, what cholesterol does to the heart, and how cholesterol levels can be controlled.   Nutrition II: Labels -Discuss the different components of food labels and how to read food label   Heart Parts/Heart Disease and PAD -Discuss the anatomy of the heart, the pathway of blood circulation through the heart, and these are affected by heart disease.   Stress I: Signs and Symptoms -Discuss the causes of stress, how stress may lead to anxiety and depression, and ways to limit stress.   Stress II: Relaxation -Discuss different types of relaxation techniques to limit stress.   Warning Signs of Stroke / TIA -Discuss definition of a stroke, what the signs and symptoms are of a stroke, and how to identify when someone is having stroke.   Knowledge  Questionnaire Score: Knowledge Questionnaire Score - 08/20/19 1410      Knowledge Questionnaire Score   Pre Score  21/24       Core Components/Risk Factors/Patient Goals at Admission: Personal Goals and Risk Factors at Admission - 08/20/19 1414      Core Components/Risk Factors/Patient Goals on Admission   Diabetes  Yes    Intervention  Provide education about signs/symptoms and action to take for hypo/hyperglycemia.;Provide education about proper nutrition, including hydration, and aerobic/resistive exercise prescription along with prescribed medications to achieve blood glucose in normal ranges: Fasting glucose 65-99 mg/dL    Expected Outcomes  Short Term: Participant verbalizes understanding of the signs/symptoms and immediate care of hyper/hypoglycemia, proper foot care and importance of medication, aerobic/resistive exercise and nutrition plan for blood glucose control.;Long Term: Attainment of HbA1C < 7%.    Hypertension  Yes    Intervention  Provide education on lifestyle modifcations including regular physical activity/exercise, weight management, moderate sodium restriction and increased consumption of fresh fruit, vegetables, and low fat dairy, alcohol moderation, and smoking cessation.;Monitor prescription use compliance.    Expected Outcomes  Short Term: Continued assessment and intervention until BP is < 140/59m HG in hypertensive participants. < 130/831mHG in hypertensive participants with diabetes, heart failure or chronic kidney disease.;Long Term: Maintenance of blood pressure at goal levels.    Lipids  Yes    Intervention  Provide education and support for participant on nutrition & aerobic/resistive exercise along with prescribed medications to achieve LDL <7059mHDL >43m54m  Expected Outcomes  Short Term: Participant states understanding of desired cholesterol values and is compliant with medications prescribed. Participant is following exercise prescription and nutrition  guidelines.;Long Term: Cholesterol controlled with medications as prescribed, with individualized exercise RX and with personalized nutrition plan. Value goals: LDL < 67m, HDL > 40 mg.       Core Components/Risk Factors/Patient Goals Review:    Core Components/Risk Factors/Patient Goals at Discharge (Final Review):    ITP Comments: ITP Comments    Row Name 08/20/19 1409           ITP Comments  Dr. TFransico Him Medical Director          Comments: Patient attended orientation on 08/20/2019 to review rules and guidelines for program.  Completed 6 minute walk test, Intitial ITP, and exercise prescription.  VSS. Telemetry-Sinus Rhythm with a first degree heart block this has been previously documented. Patient did report having some mild lower back pain 2/10 then resolved upon completion of the walk test.. Safety measures and social distancing in place per CDC guidelines. Assisted patient with downloading the virtual cardiac rehab better [facility] APP. Will follow up with the patient tomorrow as he had a little difficulty navigating on the APP. Appointment will be set up for the patient to meet with the RD.MBarnet Pall RN,BSN 08/20/2019 3:04 PM

## 2019-08-24 ENCOUNTER — Ambulatory Visit (HOSPITAL_COMMUNITY): Payer: Medicare Other

## 2019-08-26 ENCOUNTER — Telehealth (HOSPITAL_COMMUNITY): Payer: Self-pay

## 2019-08-26 ENCOUNTER — Ambulatory Visit (HOSPITAL_COMMUNITY): Payer: Medicare Other

## 2019-08-26 NOTE — Telephone Encounter (Signed)
Cardiac Rehab Note:  Unsuccessful telephone encounter to Mr. Efstathios Schlageter to follow up and assess for questions/concerns related to his recent Cardiac Rehab Orientation and prescribed exercise plan of care. Mr. Dunkley has failed to utilize the [facility] Virtual Cardiac Rehab App and barriers need to be assessed. HIPAA compliant VM left requesting call back to [phone].  Plan: Await call back. Follow up within 1 week  Danyia Borunda E. Laray Anger, BSN

## 2019-08-27 ENCOUNTER — Inpatient Hospital Stay (HOSPITAL_COMMUNITY)
Admission: RE | Admit: 2019-08-27 | Discharge: 2019-08-27 | Disposition: A | Discharge status: Home / Self Care | Payer: Medicare Other | Source: Ambulatory Visit

## 2019-08-27 ENCOUNTER — Other Ambulatory Visit: Payer: Self-pay

## 2019-08-27 NOTE — Progress Notes (Addendum)
[name] [name] 64 y.o. male Nutrition Note - in person Spoke with pt. Nutrition Plan and Nutrition Survey goals reviewed with pt.   Pt has Type 2 Diabetes. Last A1c indicates blood glucose well-controlled. This Probation officer went over Diabetes Education test results. Pt checks CBG's 1 times a day. Fasting CBG's reportedly 130 mg/dL.  Barrier to care is identified by Berneta Sages as neglect. He said sometimes he gets lazy and just stops checking his fasting CBGs. Discussed overcoming this barrier. He identifies his daughters as very supportive. They call him to remind him of his meds and make sure he is taking care of himself. He is amenable to keeping a food/CBG log.   Pt with dx of CHF. Per discussion, pt does use canned/convenience foods often. Pt does not add salt to food. Pt does not eat out frequently.   Pt expressed understanding of the information reviewed. Will f/u virtually with pt weekly while appropriate during his time in cardiac rehab.  Lab Results  Component Value Date   HGBA1C 6.6 (H) 06/27/2019    Wt Readings from Last 3 Encounters:  08/20/19 204 lb 12.9 oz (92.9 kg)  08/18/19 208 lb (94.3 kg)  08/13/19 202 lb (91.6 kg)    Nutrition Diagnosis ? Food-and nutrition-related knowledge deficit related to lack of exposure to information as related to diagnosis of: ? CVD ? Type 2 Diabetes   Nutrition Intervention ? Pt's individual nutrition plan reviewed with pt. ? Benefits of adopting Heart Healthy diet discussed  ? Continue client-centered nutrition education by RD, as part of interdisciplinary care.  Goal(s) Pt to build a healthy plate including vegetables, fruits, whole grains, and low-fat dairy products in a heart healthy meal plan. ? CBG concentrations in the normal range or as close to normal as is safely possible.    Plan:   Will provide client-centered nutrition education as part of interdisciplinary care  Monitor and evaluate progress toward nutrition goal with  team.   Michaele Offer, MS, RDN, LDN

## 2019-08-28 ENCOUNTER — Ambulatory Visit (HOSPITAL_COMMUNITY): Payer: Medicare Other

## 2019-08-31 ENCOUNTER — Other Ambulatory Visit: Payer: Self-pay | Admitting: Family Medicine

## 2019-08-31 ENCOUNTER — Ambulatory Visit (HOSPITAL_COMMUNITY): Payer: Medicare Other

## 2019-08-31 ENCOUNTER — Other Ambulatory Visit (HOSPITAL_COMMUNITY): Payer: Self-pay | Admitting: Cardiology

## 2019-08-31 DIAGNOSIS — I4819 Other persistent atrial fibrillation: Secondary | ICD-10-CM

## 2019-08-31 DIAGNOSIS — I5032 Chronic diastolic (congestive) heart failure: Secondary | ICD-10-CM

## 2019-08-31 MED ORDER — ISOSORBIDE MONONITRATE ER 120 MG PO TB24: 120.0000 mg | ORAL_TABLET | Freq: Every day | ORAL | 3 refills | Status: DC

## 2019-08-31 MED ORDER — APIXABAN 5 MG PO TABS: 5.0000 mg | ORAL_TABLET | Freq: Two times a day (BID) | ORAL | 3 refills | Status: DC

## 2019-08-31 MED ORDER — SOTALOL HCL 120 MG PO TABS: 120.0000 mg | ORAL_TABLET | Freq: Two times a day (BID) | ORAL | 3 refills | Status: DC

## 2019-08-31 MED ORDER — FUROSEMIDE 40 MG PO TABS: 40.0000 mg | ORAL_TABLET | Freq: Two times a day (BID) | ORAL | 3 refills | Status: DC

## 2019-08-31 MED ORDER — HYDRALAZINE HCL 50 MG PO TABS: 75.0000 mg | ORAL_TABLET | Freq: Two times a day (BID) | ORAL | 3 refills | Status: DC

## 2019-08-31 NOTE — Telephone Encounter (Signed)
Pt was seen in the office a few weeks ago and was unsure which medications he needed a refill on. Medications reviewed and reports he needs a refill on  eliquis sotalol  Hydralazine imdur  Lasix   meds have been refilled

## 2019-09-01 ENCOUNTER — Telehealth (HOSPITAL_COMMUNITY): Payer: Self-pay

## 2019-09-01 NOTE — Telephone Encounter (Signed)
Patient left message wanting to know if it was ok to receive the COVID19 vaccine.   Tried calling patient no answer, left a message stating that Dr. Aundra Dubin recommends that all patients get the vaccination. I also gave the phone # to schedule an appointment. I also let the patient know that he could call our office back if he has any more questions or concerns.

## 2019-09-02 ENCOUNTER — Ambulatory Visit (HOSPITAL_COMMUNITY): Payer: Medicare Other

## 2019-09-04 ENCOUNTER — Other Ambulatory Visit: Payer: Self-pay

## 2019-09-04 ENCOUNTER — Ambulatory Visit (HOSPITAL_COMMUNITY): Payer: Medicare Other

## 2019-09-04 ENCOUNTER — Inpatient Hospital Stay (HOSPITAL_COMMUNITY)
Admission: RE | Admit: 2019-09-04 | Discharge: 2019-09-04 | Disposition: A | Discharge status: Home / Self Care | Payer: Medicare Other | Source: Ambulatory Visit

## 2019-09-04 DIAGNOSIS — I208 Other forms of angina pectoris: Secondary | ICD-10-CM

## 2019-09-04 NOTE — Progress Notes (Signed)
RN Virtual Cardiac Rehab Note:  Incoming telephone call from [name] requesting assistance with downloading the [facility] App on his new cell phone. Invitation sent however patient is unable to download while on phone. Patient consents to in-person appointment today to receive assistance.  Plan: Will await patient arrival  Thaine Garriga E. Rollene Rotunda RN, BSN [place]. [facility] Cardiac and Pulmonary Rehab [place] Direct: [phone]

## 2019-09-04 NOTE — Progress Notes (Signed)
Nutrition Note: Virtual Visit  Spoke with pt for virtual cardiac rehab. [name] [name] reports not feeling well over the past week. He feels unmotivated to exercise, check blood sugars, or do much around the house. He has continued to take all meds as directed.   Diet recall reviewed. Pt denies any problems with medications.  Nutrition goals reviewed - unmet. He has not taken blood sugars in the morning or at all. Discussed starting today. RD had [name] take CBG while on phone today and it was 139 mg/dl (fasting). Will continue to remind pt of this via virtual rehab app. He has not been weighing at home due to no batteries for scale. Discussed need for daily weights due to heart failure. He has been feeling sluggish and reports SOB that has not gotten worse. Activity: take daily fasting CBG and record it Reinforced education and goals with summary email.  Will continue to monitor pt with weekly phone calls for virtual cardiac rehab.   Michaele Offer, MS, RDN, LDN

## 2019-09-04 NOTE — Progress Notes (Signed)
Cardiac Rehab Note:  In-person appointment completed.  Pt  was able to download the [employer] app on their smart device with assistance. Pt set up their account and received the following welcome message -"Welcome to the [facility] program. We hope that you will find the exercise program beneficial in your recovery process. Our staff is available to assist with any questions/concerns about your exercise routine. Best wishes". Brief orientation provided to with the advisement to watch the "Intro to Rehab" series located under the Resource tab. Pt verbalized understanding. Will continue to follow and monitor pt progress with feedback as needed.  Patient also assisted with registering for Covid vaccine appointment waiting list by calling [phone].  Vernal Rutan E. Rollene Rotunda RN, BSN [place]. [facility] [place] Direct: [phone]

## 2019-09-07 ENCOUNTER — Ambulatory Visit (HOSPITAL_COMMUNITY): Payer: Medicare Other

## 2019-09-09 ENCOUNTER — Ambulatory Visit (HOSPITAL_COMMUNITY): Payer: Medicare Other

## 2019-09-10 ENCOUNTER — Encounter (HOSPITAL_COMMUNITY): Payer: Medicare Other | Admitting: Cardiology

## 2019-09-11 ENCOUNTER — Ambulatory Visit (HOSPITAL_COMMUNITY): Payer: Medicare Other

## 2019-09-11 ENCOUNTER — Other Ambulatory Visit: Payer: Self-pay

## 2019-09-11 ENCOUNTER — Inpatient Hospital Stay (HOSPITAL_COMMUNITY)
Admission: RE | Admit: 2019-09-11 | Discharge: 2019-09-11 | Disposition: A | Discharge status: Home / Self Care | Payer: Medicare Other | Source: Ambulatory Visit

## 2019-09-11 NOTE — Progress Notes (Signed)
Nutrition Note: Virtual Visit  Spoke with pt for virtual cardiac rehab. Pt is not exercising at home. Diet recall reviewed. Pt denies any problems with medications.  Nutrition goals reviewed. He has started eating oats and whole grain bread and limiting hashbrowns. He reports checking CBGs every other morning. They are 120-140 mg/dl most days. Activity: start walking when it's nice outside, keep incorporating grains, check CBGs daily Will continue to monitor pt with weekly phone calls for virtual cardiac rehab.   Michaele Offer, MS, RDN, LDN

## 2019-09-14 ENCOUNTER — Ambulatory Visit (HOSPITAL_COMMUNITY): Payer: Medicare Other

## 2019-09-16 ENCOUNTER — Ambulatory Visit (HOSPITAL_COMMUNITY): Payer: Medicare Other

## 2019-09-16 ENCOUNTER — Telehealth (HOSPITAL_COMMUNITY): Payer: Self-pay

## 2019-09-16 NOTE — Telephone Encounter (Signed)
Virtual Cardiac Rehab Note:  Unsuccessful telephone encounter to [name] to follow up on cardiac rehab plan of care, daily exercise, and barriers to VCR [employer] app. [employer] compliant VM message left requesting call back.  Plan: Will follow up in 1 week if patient does not return call  Demaris Leavell E. Rollene Rotunda RN, BSN [place]. [facility] [place] Direct: [phone]

## 2019-09-18 ENCOUNTER — Telehealth (HOSPITAL_COMMUNITY): Payer: Self-pay

## 2019-09-18 ENCOUNTER — Ambulatory Visit (HOSPITAL_COMMUNITY): Payer: Medicare Other

## 2019-09-18 NOTE — Telephone Encounter (Signed)
Pt insurance is active and benefits verified through [facility] Medicare Co-pay 0, DED 0/0 met, out of pocket $7,550/$15.59 met, co-insurance 20%. no pre-authorization required. Passport, 09/18/2019'@1' :52pm, REF# [id number]  Will contact patient to see if he is interested in the Cardiac Rehab Program. If interested, patient will need to complete follow up appt. Once completed, patient will be contacted for scheduling upon review by the RN Navigator.

## 2019-09-21 ENCOUNTER — Encounter (HOSPITAL_COMMUNITY): Payer: Self-pay

## 2019-09-21 ENCOUNTER — Ambulatory Visit (HOSPITAL_COMMUNITY): Payer: Medicare Other

## 2019-09-21 ENCOUNTER — Telehealth (HOSPITAL_COMMUNITY): Payer: Self-pay

## 2019-09-21 DIAGNOSIS — I208 Other forms of angina pectoris: Secondary | ICD-10-CM

## 2019-09-21 NOTE — Telephone Encounter (Signed)
Phoned pt regarding Cardiac Rehab reopening for in person exercise. Pt scheduled for exercise starting 07/29/20. Spoke with pt about wearing a mask while in rehab, exercise appropriate clothing and to take his medicines before exercising. Pt verbalized understanding.   Carma Lair MS, ACSM CEP. 09/21/2019 2:16 PM

## 2019-09-21 NOTE — Addendum Note (Signed)
Encounter addended by: Sol Passer on: 09/21/2019 11:34 AM  Actions taken: Flowsheet accepted

## 2019-09-21 NOTE — Progress Notes (Signed)
Cardiac Individual Treatment Plan  Patient Details  Name: [name] [name] MRN: [id number] Date of Birth: May 05, 1956 Referring Provider:     CARDIAC REHAB PHASE II ORIENTATION from 08/20/2019 in [facility]  Referring Provider  Loralie Champagne MD      Initial Encounter Date:    CARDIAC REHAB PHASE II ORIENTATION from 08/20/2019 in [facility]  Date  08/20/19      Visit Diagnosis: Stable angina ([place])  Patient's Home Medications on Admission:  Current Outpatient Medications:  .  ACCU-CHEK SOFTCLIX LANCETS lancets, Use as instructed for three times daily testing of blood glucose, Disp: 100 each, Rfl: 12 .  acetaminophen (TYLENOL) 500 MG tablet, Take 1,000 mg by mouth every 6 (six) hours as needed (pain)., Disp: , Rfl:  .  amLODipine (NORVASC) 5 MG tablet, Take 1 tablet (5 mg total) by mouth daily., Disp: 90 tablet, Rfl: 1 .  apixaban (ELIQUIS) 5 MG TABS tablet, Take 1 tablet (5 mg total) by mouth 2 (two) times daily., Disp: 60 tablet, Rfl: 3 .  atorvastatin (LIPITOR) 80 MG tablet, Take 1 tablet (80 mg total) by mouth daily., Disp: 90 tablet, Rfl: 1 .  Blood Glucose Monitoring Suppl (ACCU-CHEK AVIVA PLUS) w/Device KIT, Use as directed for 3 times daily testing of blood glucose, Disp: 1 kit, Rfl: 0 .  carvedilol (COREG) 25 MG tablet, Take 1 tablet (25 mg total) by mouth 2 (two) times daily with a meal., Disp: 180 tablet, Rfl: 1 .  furosemide (LASIX) 40 MG tablet, Take 1 tablet (40 mg total) by mouth 2 (two) times daily., Disp: 60 tablet, Rfl: 3 .  glucose blood (ACCU-CHEK AVIVA PLUS) test strip, Use as instructed for 3 times daily testing of blood glucose, Disp: 100 each, Rfl: 12 .  guaiFENesin (MUCINEX PO), Take 1 tablet by mouth 2 (two) times daily as needed (cough/congestion)., Disp: , Rfl:  .  hydrALAZINE (APRESOLINE) 50 MG tablet, Take 1.5 tablets (75 mg total) by mouth 2 (two) times daily., Disp: 90 tablet, Rfl: 3 .   Insulin Glargine (LANTUS SOLOSTAR) 100 UNIT/ML Solostar Pen, Inject 35 Units into the skin every morning. And pen needles 1/day (Patient taking differently: Inject 35 Units into the skin daily after breakfast. ), Disp: 30 mL, Rfl: 6 .  isosorbide mononitrate (IMDUR) 120 MG 24 hr tablet, Take 1 tablet (120 mg total) by mouth daily., Disp: 30 tablet, Rfl: 3 .  Lancet Devices (ACCU-CHEK SOFTCLIX) lancets, Use as instructed for 3 times daily testing of blood glucose, Disp: 1 each, Rfl: 0 .  metFORMIN (GLUCOPHAGE) 1000 MG tablet, Take 1 tablet (1,000 mg total) by mouth 2 (two) times daily with a meal., Disp: 180 tablet, Rfl: 1 .  nitroGLYCERIN (NITROSTAT) 0.4 MG SL tablet, Place 1 tablet (0.4 mg total) under the tongue every 5 (five) minutes as needed for chest pain (x 3 tabs daily). (Patient taking differently: Place 0.4 mg under the tongue every 5 (five) minutes x 3 doses as needed for chest pain. ), Disp: 60 tablet, Rfl: 1 .  pantoprazole (PROTONIX) 40 MG tablet, Take 1 tablet (40 mg total) by mouth daily., Disp: 30 tablet, Rfl: 1 .  potassium chloride SA (KLOR-CON) 10 MEQ tablet, Take 2 tablets (20 mEq total) by mouth daily., Disp: 60 tablet, Rfl: 3 .  sotalol (BETAPACE) 120 MG tablet, Take 1 tablet (120 mg total) by mouth 2 (two) times daily., Disp: 60 tablet, Rfl: 3 .  TRUEPLUS LANCETS 28G  MISC, 1 each by Does not apply route 3 (three) times daily., Disp: 100 each, Rfl: 12  Past Medical History: Past Medical History:  Diagnosis Date  . Anginal pain ([place])   . Anxiety   . Arrhythmia   . CAD (coronary artery disease), native coronary artery    a. Nonobstructive CAD by cath 2013 - diffuse distal and branch vessel CAD, no severe disease in the major coronaries, LV mild global hypokinesis, EF 45%. b. ETT-Sestamibi 5/14: EF 31%, small fixed inferior defect with no ischemia.  . Cholecystitis   . Chronic CHF ([place])    a. Mixed ICM/NICM (?EtOH). EF 35% in 2008. Echo 5/13: EF 60-65%, mod LVH, EF 45% on V  gram in 12/2011. EF 12/2012: EF 50-55%, mild LVH, inferobasal HK, mild MR. ETT-Ses 5/14 EF 41%. Cardiac MRI 5/14: EF 44%, mild global HK, subepicardial delayed enhancement in nonspecific RV insertion pattern.  . COLONIC POLYPS, HX OF 12/30/2006  . Gout   . H/O atrial flutter 2007   a. Ablations in 2007, 2008.  [name] Heart murmur   . HEPATITIS B, CHRONIC 12/30/2006  . History of alcohol abuse   . History of hiatal hernia   . History of medication noncompliance   . History of nuclear stress test    Myoview 3/17: + chest pain; EF 33%, downsloping ST depression 2, V4-6, LVH with strain, no ischemia on images; intermediate risk   . HYPERCHOLESTEROLEMIA 07/11/2010  . Hypertension   . Left sciatic nerve pain since 04/2015  . LIVER FUNCTION TESTS, ABNORMAL, HX OF 12/30/2006  . MITRAL REGURGITATION 12/30/2006  . 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 PAF (paroxysmal atrial fibrillation) ([facility])    On coumadin  . Rectal bleeding 12/18/2011   Scheduled for colonoscopy.    . Shortness of breath dyspnea    with excertion  . Sleep apnea    "suppose to send mask but they never did" (05/03/2015)  . Type II diabetes mellitus ([facility])     Tobacco Use: Social History   Tobacco Use  Smoking Status Former Smoker  . Packs/day: 4.00  . Years: 18.00  . Pack years: 72.00  . Types: Cigarettes  . Quit date: 08/14/1983  . Years since quitting: 36.1  Smokeless Tobacco Never Used    Labs: Recent Chemical engineer    Labs for ITP Cardiac and Pulmonary Rehab Latest Ref Rng & Units 05/27/2019 06/27/2019 06/30/2019 07/01/2019 07/01/2019   Cholestrol 0 - 200 mg/dL 116 - 81 - -   LDLCALC 0 - 99 mg/dL 44 - 25 - -   LDLDIRECT mg/dL - - - - -   HDL >40 mg/dL 49.50 - 31(L) - -   Trlycerides <150 mg/dL 117.0 62 127 - -   Hemoglobin A1c 4.8 - 5.6 % - 6.6(H) - - -   PHART 7.350 - 7.450 - - - - -   PCO2ART 35.0 - 45.0 mmHg - - - - -   HCO3 20.0 - 28.0 mmol/L - - - 24.5 23.8   TCO2 22 - 32 mmol/L - - - 26 25    ACIDBASEDEF 0.0 - 2.0 mmol/L - - - - 1.0   O2SAT % - - - 65.0 67.0      Capillary Blood Glucose: Lab Results  Component Value Date   GLUCAP 162 (H) 08/20/2019   GLUCAP 139 (H) 07/02/2019   GLUCAP 92 07/02/2019   GLUCAP 108 (H) 07/01/2019   GLUCAP 151 (H) 07/01/2019  Exercise Target Goals: Exercise Program Goal: Individual exercise prescription set using results from initial 6 min walk test and THRR while considering  patient's activity barriers and safety.   Exercise Prescription Goal: Initial exercise prescription builds to 30-45 minutes a day of aerobic activity, 2-3 days per week.  Home exercise guidelines will be given to patient during program as part of exercise prescription that the participant will acknowledge.  Activity Barriers & Risk Stratification: Activity Barriers & Cardiac Risk Stratification - 08/20/19 1420      Activity Barriers & Cardiac Risk Stratification   Activity Barriers  Back Problems;Deconditioning;Muscular Weakness;Shortness of Breath    Cardiac Risk Stratification  High       6 Minute Walk: 6 Minute Walk    Row Name 08/20/19 1417         6 Minute Walk   Phase  Initial     Distance  1662 feet     Walk Time  6 minutes     # of Rest Breaks  0     MPH  3.15     METS  3.9     RPE  12     Perceived Dyspnea   0     VO2 Peak  13.5     Symptoms  No     Resting HR  70 bpm     Resting BP  128/70     Resting Oxygen Saturation   97 %     Exercise Oxygen Saturation  during 6 min walk  96 %     Max Ex. HR  103 bpm     Max Ex. BP  140/80     2 Minute Post BP  126/74        Oxygen Initial Assessment:   Oxygen Re-Evaluation:   Oxygen Discharge (Final Oxygen Re-Evaluation):   Initial Exercise Prescription: Initial Exercise Prescription - 08/20/19 1400      Date of Initial Exercise RX and Referring Provider   Date  08/20/19    Referring Provider  Loralie Champagne MD      Track   Minutes  10      Prescription Details    Frequency (times per week)  3x    Duration  Progress to 30 minutes of continuous aerobic without signs/symptoms of physical distress      Intensity   THRR 40-80% of Max Heartrate  62-124    Ratings of Perceived Exertion  11-13    Perceived Dyspnea  0-4      Progression   Progression  Continue progressive overload as per policy without signs/symptoms or physical distress.      Resistance Training   Training Prescription  No       Perform Capillary Blood Glucose checks as needed.  Exercise Prescription Changes:   Exercise Comments:  Exercise Comments    Row Name 09/21/19 1134           Exercise Comments  Patient will be contacted to begin participation in the onsite cardiac rehab program after temporary closure due to COVID-19 pandemic.          Exercise Goals and Review:  Exercise Goals    Row Name 08/20/19 1420             Exercise Goals   Increase Physical Activity  Yes       Intervention  Provide advice, education, support and counseling about physical activity/exercise needs.;Develop an individualized exercise prescription for aerobic and resistive training based on initial evaluation  findings, risk stratification, comorbidities and participant's personal goals.       Expected Outcomes  Short Term: Attend rehab on a regular basis to increase amount of physical activity.;Long Term: Add in home exercise to make exercise part of routine and to increase amount of physical activity.;Long Term: Exercising regularly at least 3-5 days a week.       Increase Strength and Stamina  Yes       Intervention  Provide advice, education, support and counseling about physical activity/exercise needs.;Develop an individualized exercise prescription for aerobic and resistive training based on initial evaluation findings, risk stratification, comorbidities and participant's personal goals.       Expected Outcomes  Short Term: Increase workloads from initial exercise prescription for  resistance, speed, and METs.;Short Term: Perform resistance training exercises routinely during rehab and add in resistance training at home;Long Term: Improve cardiorespiratory fitness, muscular endurance and strength as measured by increased METs and functional capacity (6MWT)       Able to understand and use rate of perceived exertion (RPE) scale  Yes       Intervention  Provide education and explanation on how to use RPE scale       Expected Outcomes  Short Term: Able to use RPE daily in rehab to express subjective intensity level;Long Term:  Able to use RPE to guide intensity level when exercising independently       Knowledge and understanding of Target Heart Rate Range (THRR)  Yes       Intervention  Provide education and explanation of THRR including how the numbers were predicted and where they are located for reference       Expected Outcomes  Short Term: Able to state/look up THRR;Long Term: Able to use THRR to govern intensity when exercising independently;Short Term: Able to use daily as guideline for intensity in rehab       Able to check pulse independently  Yes       Intervention  Provide education and demonstration on how to check pulse in carotid and radial arteries.;Review the importance of being able to check your own pulse for safety during independent exercise       Expected Outcomes  Short Term: Able to explain why pulse checking is important during independent exercise;Long Term: Able to check pulse independently and accurately       Understanding of Exercise Prescription  Yes       Intervention  Provide education, explanation, and written materials on patient's individual exercise prescription       Expected Outcomes  Short Term: Able to explain program exercise prescription;Long Term: Able to explain home exercise prescription to exercise independently          Exercise Goals Re-Evaluation : Exercise Goals Re-Evaluation    Row Name 09/21/19 1132             Exercise  Goal Re-Evaluation   Comments  Patient has not been logging exericse in the [facility] [employer] app and has not returned follow-up call thus far. Patient will be contacted to begin participation in the onsite cardiac rehab program after temporary closure due to COVID-19 pandemic.       Expected Outcomes  Patient will transition to the onsite cardiac rehab program if interested in participating.          Discharge Exercise Prescription (Final Exercise Prescription Changes):   Nutrition:  Target Goals: Understanding of nutrition guidelines, daily intake of sodium <1552m, cholesterol <2033m calories 30% from fat and 7%  or less from saturated fats, daily to have 5 or more servings of fruits and vegetables.  Biometrics: Pre Biometrics - 08/20/19 1410      Pre Biometrics   Height  5' 10.5" (1.791 m)    Weight  204 lb 12.9 oz (92.9 kg)    Waist Circumference  44 inches    Hip Circumference  42 inches    Waist to Hip Ratio  1.05 %    BMI (Calculated)  28.96    Triceps Skinfold  6 mm    % Body Fat  28.9 %    Grip Strength  47 kg    Flexibility  14 in    Single Leg Stand  5.81 seconds        Nutrition Therapy Plan and Nutrition Goals:   Nutrition Assessments:   Nutrition Goals Re-Evaluation:   Nutrition Goals Re-Evaluation:   Nutrition Goals Discharge (Final Nutrition Goals Re-Evaluation):   Psychosocial: Target Goals: Acknowledge presence or absence of significant depression and/or stress, maximize coping skills, provide positive support system. Participant is able to verbalize types and ability to use techniques and skills needed for reducing stress and depression.  Initial Review & Psychosocial Screening: Initial Psych Review & Screening - 08/20/19 1448      Initial Review   Current issues with  None Identified      Family Dynamics   Good Support System?  Yes   [place] has his children for support who live in town     Barriers   Psychosocial barriers to participate in  program  There are no identifiable barriers or psychosocial needs.      Screening Interventions   Interventions  Encouraged to exercise       Quality of Life Scores: Quality of Life - 08/20/19 1412      Quality of Life   Select  Quality of Life      Quality of Life Scores   Health/Function Pre  17.93 %    Socioeconomic Pre  19.36 %    Psych/Spiritual Pre  19.57 %    Family Pre  25 %    GLOBAL Pre  19.6 %      Scores of 19 and below usually indicate a poorer quality of life in these areas.  A difference of  2-3 points is a clinically meaningful difference.  A difference of 2-3 points in the total score of the Quality of Life Index has been associated with significant improvement in overall quality of life, self-image, physical symptoms, and general health in studies assessing change in quality of life.  PHQ-9: Recent Review Flowsheet Data    Depression screen [employer] 2/9 08/20/2019 09/29/2018 07/08/2018 04/07/2018 01/28/2018   Decreased Interest 0 1 0 1 1   Down, Depressed, Hopeless 0 0 0 0 0   PHQ - 2 Score 0 1 0 1 1   Altered sleeping - '2 2 1 1   ' Tired, decreased energy - '1 2 1 1   ' Change in appetite - '1 1 1 ' 0   Feeling bad or failure about yourself  - 0 1 1 0   Trouble concentrating - 0 1 1 0   Moving slowly or fidgety/restless - 0 0 0 0   Suicidal thoughts - 0 0 0 0   PHQ-9 Score - '5 7 6 3     ' Interpretation of Total Score  Total Score Depression Severity:  1-4 = Minimal depression, 5-9 = Mild depression, 10-14 = Moderate depression, 15-19 =  Moderately severe depression, 20-27 = Severe depression   Psychosocial Evaluation and Intervention: Psychosocial Evaluation - 09/21/19 1421      Psychosocial Evaluation & Interventions   Interventions  Encouraged to exercise with the program and follow exercise prescription    Comments  No psychosocial barriers identified through virtual contact.Will reassess when patient returns in person    Expected Outcomes  Patient will continue to  maintain a positive attitude and outlook.    Continue Psychosocial Services   Follow up required by staff       Psychosocial Re-Evaluation:   Psychosocial Discharge (Final Psychosocial Re-Evaluation):   Vocational Rehabilitation: Provide vocational rehab assistance to qualifying candidates.   Vocational Rehab Evaluation & Intervention: Vocational Rehab - 08/20/19 1449      Initial Vocational Rehab Evaluation & Intervention   Assessment shows need for Vocational Rehabilitation  No   Gerold is diabled and does not need vocational rehab at this time      Education: Education Goals: Education classes will be provided on a weekly basis, covering required topics. Participant will state understanding/return demonstration of topics presented.  Learning Barriers/Preferences: Learning Barriers/Preferences - 08/20/19 1415      Learning Barriers/Preferences   Learning Barriers  None    Learning Preferences  Written Material;Audio;Verbal Instruction       Education Topics: Count Your Pulse:  -Group instruction provided by verbal instruction, demonstration, patient participation and written materials to support subject.  Instructors address importance of being able to find your pulse and how to count your pulse when at home without a heart monitor.  Patients get hands on experience counting their pulse with staff help and individually.   CARDIAC REHAB PHASE II EXERCISE from 03/02/2016 in [place] [facility] [place]  Date  12/16/15  Educator  Andi Hence  Instruction Review Code (Retired)  R- Review/reinforce [Second Class for practice]      Heart Attack, Angina, and Risk Factor Modification:  -Group instruction provided by verbal instruction, video, and written materials to support subject.  Instructors address signs and symptoms of angina and heart attacks.    Also discuss risk factors for heart disease and how to make changes to improve heart health risk factors.    CARDIAC REHAB PHASE II EXERCISE from 03/02/2016 in [facility] [facility]  Date  12/14/15  Instruction Review Code (Retired)  2- meets goals/outcomes      Functional Fitness:  -Group instruction provided by verbal instruction, demonstration, patient participation, and written materials to support subject.  Instructors address safety measures for doing things around the house.  Discuss how to get up and down off the floor, how to pick things up properly, how to safely get out of a chair without assistance, and balance training.   CARDIAC REHAB PHASE II EXERCISE from 03/02/2016 in [facility]  Date  03/02/16  Instruction Review Code (Retired)  2- Media planner and Mindfulness:  -Group instruction provided by verbal instruction, patient participation, and written materials to support subject.  Instructor addresses importance of mindfulness and meditation practice to help reduce stress and improve awareness.  Instructor also leads participants through a meditation exercise.    CARDIAC REHAB PHASE II EXERCISE from 03/02/2016 in [facility]  Date  01/04/16  Educator  Jeanella Craze  Instruction Review Code (Retired)  2- Product/process development scientist for Flexibility and Mobility:  -Group instruction provided  by verbal instruction, patient participation, and written materials to support subject.  Instructors lead participants through series of stretches that are designed to increase flexibility thus improving mobility.  These stretches are additional exercise for major muscle groups that are typically performed during regular warm up and cool down.   CARDIAC REHAB PHASE II EXERCISE from 03/02/2016 in [facility]  Date  01/07/16  Educator  Luetta Nutting Iline Oven  Instruction Review Code (Retired)  2- meets goals/outcomes      Hands Only CPR:  -Group verbal,  video, and participation provides a basic overview of AHA guidelines for community CPR. Role-play of emergencies allow participants the opportunity to practice calling for help and chest compression technique with discussion of AED use.   Hypertension: -Group verbal and written instruction that provides a basic overview of hypertension including the most recent diagnostic guidelines, risk factor reduction with self-care instructions and medication management.    Nutrition I class: Heart Healthy Eating:  -Group instruction provided by PowerPoint slides, verbal discussion, and written materials to support subject matter. The instructor gives an explanation and review of the Therapeutic Lifestyle Changes diet recommendations, which includes a discussion on lipid goals, dietary fat, sodium, fiber, plant stanol/sterol esters, sugar, and the components of a well-balanced, healthy diet.   CARDIAC REHAB PHASE II EXERCISE from 03/02/2016 in [facility]  Date  11/18/15  Educator  RD  Instruction Review Code (Retired)  Not applicable      Nutrition II class: Lifestyle Skills:  -Group instruction provided by [employer], verbal discussion, and written materials to support subject matter. The instructor gives an explanation and review of label reading, grocery shopping for heart health, heart healthy recipe modifications, and ways to make healthier choices when eating out.   CARDIAC REHAB PHASE II EXERCISE from 03/02/2016 in [place] [facility]  Date  11/18/15  Educator  RD  Instruction Review Code (Retired)  Not applicable      Diabetes Question & Answer:  -Group instruction provided by PowerPoint slides, verbal discussion, and written materials to support subject matter. The instructor gives an explanation and review of diabetes co-morbidities, pre- and post-prandial blood glucose goals, pre-exercise blood glucose goals, signs, symptoms, and  treatment of hypoglycemia and hyperglycemia, and foot care basics.   CARDIAC REHAB PHASE II EXERCISE from 03/02/2016 in [place]  Date  12/09/15  Educator  RD  Instruction Review Code (Retired)  2- meets goals/outcomes      Diabetes Blitz:  -Group instruction provided by [employer], verbal discussion, and written materials to support subject matter. The instructor gives an explanation and review of the physiology behind type 1 and type 2 diabetes, diabetes medications and rational behind using different medications, pre- and post-prandial blood glucose recommendations and Hemoglobin A1c goals, diabetes diet, and exercise including blood glucose guidelines for exercising safely.    CARDIAC REHAB PHASE II EXERCISE from 03/02/2016 in [place]  Date  11/18/15  Educator  RD  Instruction Review Code (Retired)  Not applicable      Portion Distortion:  -Group instruction provided by [employer], verbal discussion, written materials, and food models to support subject matter. The instructor gives an explanation of serving size versus portion size, changes in portions sizes over the last 20 years, and what consists of a serving from each food group.   Stress Management:  -Group instruction provided by verbal instruction, video,  and written materials to support subject matter.  Instructors review role of stress in heart disease and how to cope with stress positively.     CARDIAC REHAB PHASE II EXERCISE from 03/02/2016 in [place]  Date  11/30/15  Instruction Review Code (Retired)  2- meets goals/outcomes      Exercising on Your Own:  -Group instruction provided by verbal instruction, power point, and written materials to support subject.  Instructors discuss benefits of exercise, components of exercise, frequency and intensity of exercise, and end points for exercise.  Also discuss use  of nitroglycerin and activating EMS.  Review options of places to exercise outside of rehab.  Review guidelines for sex with heart disease.   CARDIAC REHAB PHASE II EXERCISE from 03/02/2016 in [place]  Date  01/11/16  Educator  Seward Carol, MS,ACSM CCES      Cardiac Drugs I:  -Group instruction provided by verbal instruction and written materials to support subject.  Instructor reviews cardiac drug classes: antiplatelets, anticoagulants, beta blockers, and statins.  Instructor discusses reasons, side effects, and lifestyle considerations for each drug class.   CARDIAC REHAB PHASE II EXERCISE from 03/02/2016 in [place]  Date  11/23/15  Educator  Pharm D  Instruction Review Code (Retired)  2- meets goals/outcomes      Cardiac Drugs II:  -Group instruction provided by verbal instruction and written materials to support subject.  Instructor reviews cardiac drug classes: angiotensin converting enzyme inhibitors (ACE-I), angiotensin II receptor blockers (ARBs), nitrates, and calcium channel blockers.  Instructor discusses reasons, side effects, and lifestyle considerations for each drug class.   CARDIAC REHAB PHASE II EXERCISE from 03/02/2016 in [place]  Date  12/21/15  Educator  Pharm D  Instruction Review Code (Retired)  2- Lawyer and Physiology of the Circulatory System:  Group verbal and written instruction and models provide basic cardiac anatomy and physiology, with the coronary electrical and arterial systems. Review of: AMI, Angina, Valve disease, Heart Failure, Peripheral Artery Disease, Cardiac Arrhythmia, Pacemakers, and the ICD.   CARDIAC REHAB PHASE II EXERCISE from 03/02/2016 in [place]  Date  02/29/16  Instruction Review Code (Retired)  2- meets goals/outcomes      Other Education:  -Group or individual  verbal, written, or video instructions that support the educational goals of the cardiac rehab program.   Holiday Eating Survival Tips:  -Group instruction provided by PowerPoint slides, verbal discussion, and written materials to support subject matter. The instructor gives patients tips, tricks, and techniques to help them not only survive but enjoy the holidays despite the onslaught of food that accompanies the holidays.   Knowledge Questionnaire Score: Knowledge Questionnaire Score - 08/20/19 1410      Knowledge Questionnaire Score   Pre Score  21/24       Core Components/Risk Factors/Patient Goals at Admission: Personal Goals and Risk Factors at Admission - 08/20/19 1414      Core Components/Risk Factors/Patient Goals on Admission   Diabetes  Yes    Intervention  Provide education about signs/symptoms and action to take for hypo/hyperglycemia.;Provide education about proper nutrition, including hydration, and aerobic/resistive exercise prescription along with prescribed medications to achieve blood glucose in normal ranges: Fasting glucose 65-99 mg/dL    Expected Outcomes  Short Term: Participant verbalizes understanding of the signs/symptoms and immediate care of hyper/hypoglycemia, proper foot  care and importance of medication, aerobic/resistive exercise and nutrition plan for blood glucose control.;Long Term: Attainment of HbA1C < 7%.    Hypertension  Yes    Intervention  Provide education on lifestyle modifcations including regular physical activity/exercise, weight management, moderate sodium restriction and increased consumption of fresh fruit, vegetables, and low fat dairy, alcohol moderation, and smoking cessation.;Monitor prescription use compliance.    Expected Outcomes  Short Term: Continued assessment and intervention until BP is < 140/61m HG in hypertensive participants. < 130/856mHG in hypertensive participants with diabetes, heart failure or chronic kidney disease.;Long  Term: Maintenance of blood pressure at goal levels.    Lipids  Yes    Intervention  Provide education and support for participant on nutrition & aerobic/resistive exercise along with prescribed medications to achieve LDL <7025mHDL >63m47m  Expected Outcomes  Short Term: Participant states understanding of desired cholesterol values and is compliant with medications prescribed. Participant is following exercise prescription and nutrition guidelines.;Long Term: Cholesterol controlled with medications as prescribed, with individualized exercise RX and with personalized nutrition plan. Value goals: LDL < 70mg92mL > 40 mg.       Core Components/Risk Factors/Patient Goals Review:  Goals and Risk Factor Review    Row Name 09/21/19 1423             Core Components/Risk Factors/Patient Goals Review   Personal Goals Review  Weight Management/Obesity;Lipids;Hypertension;Diabetes       Review  Patient with multiple CAD risk factors. He is eager to return to in person CR exercise sessions.       Expected Outcomes  Patient will continue to participate in cardiac rehab for risk factor modification          Core Components/Risk Factors/Patient Goals at Discharge (Final Review):  Goals and Risk Factor Review - 09/21/19 1423      Core Components/Risk Factors/Patient Goals Review   Personal Goals Review  Weight Management/Obesity;Lipids;Hypertension;Diabetes    Review  Patient with multiple CAD risk factors. He is eager to return to in person CR exercise sessions.    Expected Outcomes  Patient will continue to participate in cardiac rehab for risk factor modification       ITP Comments: ITP Comments    Row Name 08/20/19 1409 09/21/19 1416         ITP Comments  Dr. TraciFransico Himical Director  30 day ITP review:In-person Cardiac Rehab exercise session will resume. Patient is scheduled to return to exercise 09/30/19. He will continue to be offered [facility] virtual cardiac rehab app for  utilization and communication with CR staff when not on site.         Comments: see ITP comments

## 2019-09-23 ENCOUNTER — Ambulatory Visit (HOSPITAL_COMMUNITY): Payer: Medicare Other

## 2019-09-25 ENCOUNTER — Ambulatory Visit (HOSPITAL_COMMUNITY): Payer: Medicare Other

## 2019-09-28 ENCOUNTER — Ambulatory Visit (HOSPITAL_COMMUNITY): Payer: Medicare Other

## 2019-09-30 ENCOUNTER — Ambulatory Visit (HOSPITAL_COMMUNITY): Payer: Medicare Other

## 2019-09-30 ENCOUNTER — Encounter (HOSPITAL_COMMUNITY)
Admission: RE | Admit: 2019-09-30 | Discharge: 2019-09-30 | Disposition: A | Discharge status: Home / Self Care | Payer: Medicare Other | Source: Ambulatory Visit | Attending: Cardiology | Admitting: Cardiology

## 2019-09-30 ENCOUNTER — Other Ambulatory Visit: Payer: Self-pay

## 2019-09-30 DIAGNOSIS — E7800 Pure hypercholesterolemia, unspecified: Secondary | ICD-10-CM | POA: Diagnosis not present

## 2019-09-30 DIAGNOSIS — I208 Other forms of angina pectoris: Secondary | ICD-10-CM | POA: Diagnosis present

## 2019-09-30 DIAGNOSIS — Z794 Long term (current) use of insulin: Secondary | ICD-10-CM | POA: Diagnosis not present

## 2019-09-30 DIAGNOSIS — Z7901 Long term (current) use of anticoagulants: Secondary | ICD-10-CM | POA: Diagnosis not present

## 2019-09-30 DIAGNOSIS — Z79899 Other long term (current) drug therapy: Secondary | ICD-10-CM | POA: Diagnosis not present

## 2019-09-30 DIAGNOSIS — Z87891 Personal history of nicotine dependence: Secondary | ICD-10-CM | POA: Diagnosis not present

## 2019-09-30 DIAGNOSIS — G4730 Sleep apnea, unspecified: Secondary | ICD-10-CM | POA: Insufficient documentation

## 2019-09-30 DIAGNOSIS — I4891 Unspecified atrial fibrillation: Secondary | ICD-10-CM | POA: Insufficient documentation

## 2019-09-30 DIAGNOSIS — E119 Type 2 diabetes mellitus without complications: Secondary | ICD-10-CM | POA: Insufficient documentation

## 2019-09-30 DIAGNOSIS — I509 Heart failure, unspecified: Secondary | ICD-10-CM | POA: Diagnosis not present

## 2019-09-30 DIAGNOSIS — I110 Hypertensive heart disease with heart failure: Secondary | ICD-10-CM | POA: Diagnosis not present

## 2019-09-30 DIAGNOSIS — I25118 Atherosclerotic heart disease of native coronary artery with other forms of angina pectoris: Secondary | ICD-10-CM | POA: Diagnosis not present

## 2019-09-30 LAB — GLUCOSE, CAPILLARY
Glucose-Capillary: 211 mg/dL — ABNORMAL HIGH (ref 70–99)
Glucose-Capillary: 156 mg/dL — ABNORMAL HIGH (ref 70–99)

## 2019-09-30 NOTE — Progress Notes (Signed)
Daily Session Note  Patient Details  Name: [name] [name] MRN: [id number] Date of Birth: 02/07/1956 Referring Provider:     CARDIAC REHAB PHASE II ORIENTATION from 08/20/2019 in [facility]  Referring Provider  McLean, Dalton MD      Encounter Date: 09/30/2019  Check In:   Capillary Blood Glucose: No results found for this or any previous visit (from the past 24 hour(s)).    Social History   Tobacco Use  Smoking Status Former Smoker  . Packs/day: 4.00  . Years: 18.00  . Pack years: 72.00  . Types: Cigarettes  . Quit date: 08/14/1983  . Years since quitting: 36.1  Smokeless Tobacco Never Used    Goals Met:  Exercise tolerated well No report of cardiac concerns or symptoms  Goals Unmet:  Not Applicable  Comments: Pt started cardiac rehab today.  Pt tolerated light exercise without difficulty. VSS, telemetry-NSR, asymptomatic.  Medication list reconciled. Pt denies barriers to medicaiton compliance.  PSYCHOSOCIAL ASSESSMENT:  PHQ-0. Pt exhibits positive coping skills, hopeful outlook with supportive family. No psychosocial needs identified at this time, no psychosocial interventions necessary. Pt oriented to exercise equipment and routine.    Understanding verbalized.   Dr. Traci Turner is Medical Director for Cardiac Rehab at [facility]. 

## 2019-10-02 ENCOUNTER — Ambulatory Visit (HOSPITAL_COMMUNITY): Payer: Medicare Other

## 2019-10-05 ENCOUNTER — Ambulatory Visit (HOSPITAL_COMMUNITY): Payer: Medicare Other

## 2019-10-07 ENCOUNTER — Ambulatory Visit (HOSPITAL_COMMUNITY): Payer: Medicare Other

## 2019-10-09 ENCOUNTER — Ambulatory Visit (HOSPITAL_COMMUNITY): Payer: Medicare Other

## 2019-10-09 ENCOUNTER — Telehealth (HOSPITAL_COMMUNITY): Payer: Self-pay

## 2019-10-09 NOTE — Telephone Encounter (Signed)
Cardiac Rehab Note:  Unsuccessful telephone encounter to [name] to follow up on extended absence from CR exercise sessions. HIPAA compliant VM message left requesting call back to [phone].  Plan: Will await call back. Follow up in 1 week  Nykeem Citro E. Rollene Rotunda RN, BSN [place]. [facility]  Cardiac and Pulmonary Rehabilitation [place] Direct: [phone]

## 2019-10-12 ENCOUNTER — Ambulatory Visit (HOSPITAL_COMMUNITY): Payer: Medicare Other

## 2019-10-12 ENCOUNTER — Other Ambulatory Visit (HOSPITAL_COMMUNITY): Payer: Self-pay | Admitting: Cardiology

## 2019-10-12 DIAGNOSIS — I10 Essential (primary) hypertension: Secondary | ICD-10-CM

## 2019-10-14 ENCOUNTER — Ambulatory Visit (HOSPITAL_COMMUNITY): Payer: Medicare Other

## 2019-10-14 ENCOUNTER — Other Ambulatory Visit: Payer: Self-pay

## 2019-10-14 ENCOUNTER — Encounter (HOSPITAL_COMMUNITY)
Admission: RE | Admit: 2019-10-14 | Discharge: 2019-10-14 | Disposition: A | Discharge status: Home / Self Care | Payer: Self-pay | Source: Ambulatory Visit | Attending: Cardiology | Admitting: Cardiology

## 2019-10-14 DIAGNOSIS — I208 Other forms of angina pectoris: Secondary | ICD-10-CM | POA: Insufficient documentation

## 2019-10-14 NOTE — Progress Notes (Signed)
Discharge Progress Report  Patient Details  Name: [name] [name] MRN: [id number] Date of Birth: 1955-09-20 Referring Provider:     [place] from 08/20/2019 in [place]  Referring Provider  Loralie Champagne MD       Number of Visits: 1  Reason for Discharge:  Early Exit:  Lack of attendance-see comment below  Smoking History:  Social History   Tobacco Use  Smoking Status Former Smoker  . Packs/day: 4.00  . Years: 18.00  . Pack years: 72.00  . Types: Cigarettes  . Quit date: 08/14/1983  . Years since quitting: 36.1  Smokeless Tobacco Never Used    Diagnosis:  Stable angina ([place])  ADL [facility]:   Initial Exercise Prescription: Initial Exercise Prescription - 08/20/19 1400      Date of Initial Exercise RX and Referring Provider   Date  08/20/19    Referring Provider  Loralie Champagne MD      Track   Minutes  10      Prescription Details   Frequency (times per week)  3x    Duration  Progress to 30 minutes of continuous aerobic without signs/symptoms of physical distress      Intensity   THRR 40-80% of Max Heartrate  62-124    Ratings of Perceived Exertion  11-13    Perceived Dyspnea  0-4      Progression   Progression  Continue progressive overload as per policy without signs/symptoms or physical distress.      Resistance Training   Training Prescription  No       Discharge Exercise Prescription (Final Exercise Prescription Changes): Exercise Prescription Changes - 09/30/19 1116      Response to Exercise   Blood Pressure (Admit)  138/74    Blood Pressure (Exercise)  144/84    Blood Pressure (Exit)  138/82    Heart Rate (Admit)  79 bpm    Heart Rate (Exercise)  97 bpm    Heart Rate (Exit)  81 bpm    Rating of Perceived Exertion (Exercise)  13    Perceived Dyspnea (Exercise)  0    Symptoms  None    Comments  Pt's first day of exercise    Duration  Progress to 10 minutes continuous walking  at  current work load and total walking time to 30-45 min    Intensity  THRR unchanged      Progression   Progression  Continue to progress workloads to maintain intensity without signs/symptoms of physical distress.    Average METs  2.45      Resistance Training   Training Prescription  No      Recumbant Bike   Level  3    Minutes  15    METs  2.1      NuStep   Level  3    SPM  85    Minutes  15    METs  2.8      Track   Minutes  10       Functional Capacity: [place] Name 08/20/19 1417         6 Minute Walk   Phase  Initial     Distance  1662 feet     Walk Time  6 minutes     # of Rest Breaks  0     MPH  3.15     METS  3.9     RPE  12     Perceived Dyspnea   0     VO2 Peak  13.5     Symptoms  No     Resting HR  70 bpm     Resting BP  128/70     Resting Oxygen Saturation   97 %     Exercise Oxygen Saturation  during 6 min walk  96 %     Max Ex. HR  103 bpm     Max Ex. BP  140/80     2 Minute Post BP  126/74        Psychological, QOL, Others - Outcomes: PHQ 2/9: Depression screen [facility] 2/9 08/20/2019 09/29/2018 07/08/2018 04/07/2018 01/28/2018  Decreased Interest 0 1 0 1 1  Down, Depressed, Hopeless 0 0 0 0 0  PHQ - 2 Score 0 1 0 1 1  Altered sleeping - 2 2 1 1   Tired, decreased energy - 1 2 1 1   Change in appetite - 1 1 1  0  Feeling bad or failure about yourself  - 0 1 1 0  Trouble concentrating - 0 1 1 0  Moving slowly or fidgety/restless - 0 0 0 0  Suicidal thoughts - 0 0 0 0  PHQ-9 Score - 5 7 6 3   Some recent data might be hidden    Quality of Life: Quality of Life - 08/20/19 1412      Quality of Life   Select  Quality of Life      Quality of Life Scores   Health/Function Pre  17.93 %    Socioeconomic Pre  19.36 %    Psych/Spiritual Pre  19.57 %    Family Pre  25 %    GLOBAL Pre  19.6 %       Personal Goals: Goals established at orientation with interventions provided to work toward goal. Personal Goals and Risk Factors at  Admission - 08/20/19 1414      Core Components/Risk Factors/Patient Goals on Admission   Diabetes  Yes    Intervention  Provide education about signs/symptoms and action to take for hypo/hyperglycemia.;Provide education about proper nutrition, including hydration, and aerobic/resistive exercise prescription along with prescribed medications to achieve blood glucose in normal ranges: Fasting glucose 65-99 mg/dL    Expected Outcomes  Short Term: Participant verbalizes understanding of the signs/symptoms and immediate care of hyper/hypoglycemia, proper foot care and importance of medication, aerobic/resistive exercise and nutrition plan for blood glucose control.;Long Term: Attainment of HbA1C < 7%.    Hypertension  Yes    Intervention  Provide education on lifestyle modifcations including regular physical activity/exercise, weight management, moderate sodium restriction and increased consumption of fresh fruit, vegetables, and low fat dairy, alcohol moderation, and smoking cessation.;Monitor prescription use compliance.    Expected Outcomes  Short Term: Continued assessment and intervention until BP is < 140/57mm HG in hypertensive participants. < 130/103mm HG in hypertensive participants with diabetes, heart failure or chronic kidney disease.;Long Term: Maintenance of blood pressure at goal levels.    Lipids  Yes    Intervention  Provide education and support for participant on nutrition & aerobic/resistive exercise along with prescribed medications to achieve LDL 70mg , HDL >40mg .    Expected Outcomes  Short Term: Participant states understanding of desired cholesterol values and is compliant with medications prescribed. Participant is following exercise prescription and nutrition guidelines.;Long Term: Cholesterol controlled with medications as prescribed, with individualized exercise RX and with personalized nutrition plan. Value goals: LDL < 70mg , HDL >  40 mg.        Personal Goals Discharge: Goals  and Risk Factor Review    Row Name 09/21/19 1423 09/30/19 1400           Core Components/Risk Factors/Patient Goals Review   Personal Goals Review  Weight Management/Obesity;Lipids;Hypertension;Diabetes  --      Review  Patient with multiple CAD risk factors. He is eager to return to in person CR exercise sessions.  Patient with multiple CAD risk factors. He is happy to return to in-person exercise session. He is eager to learn lifestyle modifications to decrease his CAD and HF risk factors.      Expected Outcomes  Patient will continue to participate in cardiac rehab for risk factor modification  Patient will continue to participate in cardiac rehab for risk factor modification         Exercise Goals and Review: Exercise Goals    Row Name 08/20/19 1420             Exercise Goals   Increase Physical Activity  Yes       Intervention  Provide advice, education, support and counseling about physical activity/exercise needs.;Develop an individualized exercise prescription for aerobic and resistive training based on initial evaluation findings, risk stratification, comorbidities and participant's personal goals.       Expected Outcomes  Short Term: Attend rehab on a regular basis to increase amount of physical activity.;Long Term: Add in home exercise to make exercise part of routine and to increase amount of physical activity.;Long Term: Exercising regularly at least 3-5 days a week.       Increase Strength and Stamina  Yes       Intervention  Provide advice, education, support and counseling about physical activity/exercise needs.;Develop an individualized exercise prescription for aerobic and resistive training based on initial evaluation findings, risk stratification, comorbidities and participant's personal goals.       Expected Outcomes  Short Term: Increase workloads from initial exercise prescription for resistance, speed, and METs.;Short Term: Perform resistance training exercises  routinely during rehab and add in resistance training at home;Long Term: Improve cardiorespiratory fitness, muscular endurance and strength as measured by increased METs and functional capacity (6MWT)       Able to understand and use rate of perceived exertion (RPE) scale  Yes       Intervention  Provide education and explanation on how to use RPE scale       Expected Outcomes  Short Term: Able to use RPE daily in rehab to express subjective intensity level;Long Term:  Able to use RPE to guide intensity level when exercising independently       Knowledge and understanding of Target Heart Rate Range (THRR)  Yes       Intervention  Provide education and explanation of THRR including how the numbers were predicted and where they are located for reference       Expected Outcomes  Short Term: Able to state/look up THRR;Long Term: Able to use THRR to govern intensity when exercising independently;Short Term: Able to use daily as guideline for intensity in rehab       Able to check pulse independently  Yes       Intervention  Provide education and demonstration on how to check pulse in carotid and radial arteries.;Review the importance of being able to check your own pulse for safety during independent exercise       Expected Outcomes  Short Term: Able to explain why pulse checking is  important during independent exercise;Long Term: Able to check pulse independently and accurately       Understanding of Exercise Prescription  Yes       Intervention  Provide education, explanation, and written materials on patient's individual exercise prescription       Expected Outcomes  Short Term: Able to explain program exercise prescription;Long Term: Able to explain home exercise prescription to exercise independently          Exercise Goals Re-Evaluation: Exercise Goals Re-Evaluation    Row Name 09/21/19 1132             Exercise Goal Re-Evaluation   Comments  Patient has not been logging exericse in the  [facility] app and has not returned follow-up call thus far. Patient will be contacted to begin participation in the onsite cardiac rehab program after temporary closure due to COVID-19 pandemic.       Expected Outcomes  Patient will transition to the onsite cardiac rehab program if interested in participating.          Nutrition & Weight - Outcomes: Pre Biometrics - 08/20/19 1410      Pre Biometrics   Height  5' 10.5" (1.791 m)    Weight  92.9 kg    Waist Circumference  44 inches    Hip Circumference  42 inches    Waist to Hip Ratio  1.05 %    BMI (Calculated)  28.96    Triceps Skinfold  6 mm    % Body Fat  28.9 %    Grip Strength  47 kg    Flexibility  14 in    Single Leg Stand  5.81 seconds        Nutrition:   Nutrition Discharge:   Education Questionnaire Score: Knowledge Questionnaire Score - 08/20/19 1410      Knowledge Questionnaire Score   Pre Score  21/24      Virtual CR Note:   Patient states he does not feel he will benefit from CR. He has attended 1 CR session since in-person exercise resumed 09/28/19. He has not utilized the [facility] Virtual CR app as previously discussed however today he states he will begin utilizing. It is agreed that if patient does not utilize app within 1 week he will be discharged. Patient states he is walking at home "several days a week".  Nicci Vaughan E. Rollene Rotunda RN, BSN [place]. [facility]  Cardiac and Pulmonary Rehabilitation Phone: [phone] Fax: [phone]

## 2019-10-16 ENCOUNTER — Ambulatory Visit (HOSPITAL_COMMUNITY): Payer: Medicare Other

## 2019-10-19 ENCOUNTER — Ambulatory Visit (HOSPITAL_COMMUNITY): Payer: Medicare Other

## 2019-10-20 DIAGNOSIS — Z794 Long term (current) use of insulin: Secondary | ICD-10-CM | POA: Diagnosis not present

## 2019-10-20 DIAGNOSIS — H25813 Combined forms of age-related cataract, bilateral: Secondary | ICD-10-CM | POA: Diagnosis not present

## 2019-10-20 DIAGNOSIS — E1136 Type 2 diabetes mellitus with diabetic cataract: Secondary | ICD-10-CM | POA: Diagnosis not present

## 2019-10-21 ENCOUNTER — Ambulatory Visit (HOSPITAL_COMMUNITY): Payer: Medicare Other

## 2019-10-23 ENCOUNTER — Ambulatory Visit (HOSPITAL_COMMUNITY): Payer: Medicare Other

## 2019-10-26 ENCOUNTER — Ambulatory Visit (HOSPITAL_COMMUNITY): Payer: Medicare Other

## 2019-10-28 ENCOUNTER — Ambulatory Visit (HOSPITAL_COMMUNITY): Payer: Medicare Other

## 2019-10-30 ENCOUNTER — Ambulatory Visit (HOSPITAL_COMMUNITY): Payer: Medicare Other

## 2019-11-02 ENCOUNTER — Ambulatory Visit (HOSPITAL_COMMUNITY): Payer: Medicare Other

## 2019-11-04 ENCOUNTER — Ambulatory Visit (HOSPITAL_COMMUNITY): Payer: Medicare Other

## 2019-11-06 ENCOUNTER — Ambulatory Visit (HOSPITAL_COMMUNITY): Payer: Medicare Other

## 2019-11-09 ENCOUNTER — Ambulatory Visit (HOSPITAL_COMMUNITY): Payer: Medicare Other

## 2019-11-11 ENCOUNTER — Ambulatory Visit (HOSPITAL_COMMUNITY): Payer: Medicare Other

## 2019-11-13 ENCOUNTER — Ambulatory Visit (HOSPITAL_COMMUNITY): Payer: Medicare Other

## 2019-11-16 ENCOUNTER — Ambulatory Visit (HOSPITAL_COMMUNITY): Payer: Medicare Other

## 2019-11-17 ENCOUNTER — Other Ambulatory Visit: Payer: Self-pay | Admitting: Family Medicine

## 2019-11-17 DIAGNOSIS — E119 Type 2 diabetes mellitus without complications: Secondary | ICD-10-CM

## 2019-11-18 ENCOUNTER — Ambulatory Visit (HOSPITAL_COMMUNITY): Payer: Medicare Other

## 2019-11-20 ENCOUNTER — Ambulatory Visit (HOSPITAL_COMMUNITY): Payer: Medicare Other

## 2019-11-20 ENCOUNTER — Other Ambulatory Visit: Payer: Self-pay

## 2019-11-20 DIAGNOSIS — E119 Type 2 diabetes mellitus without complications: Secondary | ICD-10-CM

## 2019-11-20 MED ORDER — LANTUS SOLOSTAR 100 UNIT/ML ~~LOC~~ SOPN: 35.0000 [IU] | PEN_INJECTOR | SUBCUTANEOUS | 3 refills | Status: DC

## 2019-11-23 ENCOUNTER — Ambulatory Visit (HOSPITAL_COMMUNITY): Payer: Medicare Other

## 2019-12-01 ENCOUNTER — Other Ambulatory Visit: Payer: Self-pay

## 2019-12-02 ENCOUNTER — Ambulatory Visit: Payer: Medicare Other | Admitting: Family Medicine

## 2019-12-02 ENCOUNTER — Other Ambulatory Visit: Payer: Self-pay

## 2019-12-02 ENCOUNTER — Emergency Department (HOSPITAL_COMMUNITY)
Admission: EM | Admit: 2019-12-02 | Discharge: 2019-12-02 | Disposition: A | Discharge status: Home / Self Care | Payer: Medicare Other | Attending: Emergency Medicine | Admitting: Emergency Medicine

## 2019-12-02 ENCOUNTER — Encounter (HOSPITAL_COMMUNITY): Payer: Self-pay | Admitting: Emergency Medicine

## 2019-12-02 ENCOUNTER — Telehealth: Payer: Self-pay

## 2019-12-02 ENCOUNTER — Emergency Department (HOSPITAL_COMMUNITY): Payer: Medicare Other

## 2019-12-02 DIAGNOSIS — R05 Cough: Secondary | ICD-10-CM | POA: Insufficient documentation

## 2019-12-02 DIAGNOSIS — I2510 Atherosclerotic heart disease of native coronary artery without angina pectoris: Secondary | ICD-10-CM | POA: Diagnosis not present

## 2019-12-02 DIAGNOSIS — I5032 Chronic diastolic (congestive) heart failure: Secondary | ICD-10-CM | POA: Diagnosis not present

## 2019-12-02 DIAGNOSIS — Z7901 Long term (current) use of anticoagulants: Secondary | ICD-10-CM | POA: Insufficient documentation

## 2019-12-02 DIAGNOSIS — R0602 Shortness of breath: Secondary | ICD-10-CM

## 2019-12-02 DIAGNOSIS — Z794 Long term (current) use of insulin: Secondary | ICD-10-CM | POA: Diagnosis not present

## 2019-12-02 DIAGNOSIS — Z20822 Contact with and (suspected) exposure to covid-19: Secondary | ICD-10-CM | POA: Insufficient documentation

## 2019-12-02 DIAGNOSIS — N183 Chronic kidney disease, stage 3 unspecified: Secondary | ICD-10-CM | POA: Diagnosis not present

## 2019-12-02 DIAGNOSIS — Z79899 Other long term (current) drug therapy: Secondary | ICD-10-CM | POA: Insufficient documentation

## 2019-12-02 DIAGNOSIS — I130 Hypertensive heart and chronic kidney disease with heart failure and stage 1 through stage 4 chronic kidney disease, or unspecified chronic kidney disease: Secondary | ICD-10-CM | POA: Insufficient documentation

## 2019-12-02 DIAGNOSIS — Z87891 Personal history of nicotine dependence: Secondary | ICD-10-CM | POA: Diagnosis not present

## 2019-12-02 DIAGNOSIS — I4891 Unspecified atrial fibrillation: Secondary | ICD-10-CM | POA: Insufficient documentation

## 2019-12-02 DIAGNOSIS — F1290 Cannabis use, unspecified, uncomplicated: Secondary | ICD-10-CM | POA: Diagnosis not present

## 2019-12-02 DIAGNOSIS — E1122 Type 2 diabetes mellitus with diabetic chronic kidney disease: Secondary | ICD-10-CM | POA: Insufficient documentation

## 2019-12-02 DIAGNOSIS — R059 Cough, unspecified: Secondary | ICD-10-CM

## 2019-12-02 LAB — CBC
HCT: 37.7 % — ABNORMAL LOW (ref 39.0–52.0)
Hemoglobin: 11 g/dL — ABNORMAL LOW (ref 13.0–17.0)
MCH: 20.9 pg — ABNORMAL LOW (ref 26.0–34.0)
MCHC: 29.2 g/dL — ABNORMAL LOW (ref 30.0–36.0)
MCV: 71.7 fL — ABNORMAL LOW (ref 80.0–100.0)
Platelets: 334 10*3/uL (ref 150–400)
RBC: 5.26 MIL/uL (ref 4.22–5.81)
RDW: 21.5 % — ABNORMAL HIGH (ref 11.5–15.5)
WBC: 6.6 10*3/uL (ref 4.0–10.5)
nRBC: 0 % (ref 0.0–0.2)

## 2019-12-02 LAB — BASIC METABOLIC PANEL
Anion gap: 12 (ref 5–15)
BUN: 15 mg/dL (ref 8–23)
CO2: 20 mmol/L — ABNORMAL LOW (ref 22–32)
Calcium: 9 mg/dL (ref 8.9–10.3)
Chloride: 106 mmol/L (ref 98–111)
Creatinine, Ser: 1.43 mg/dL — ABNORMAL HIGH (ref 0.61–1.24)
GFR calc Af Amer: 59 mL/min — ABNORMAL LOW (ref >60)
GFR calc non Af Amer: 51 mL/min — ABNORMAL LOW (ref >60)
Glucose, Bld: 284 mg/dL — ABNORMAL HIGH (ref 70–99)
Potassium: 3.8 mmol/L (ref 3.5–5.1)
Sodium: 138 mmol/L (ref 135–145)

## 2019-12-02 LAB — SARS CORONAVIRUS 2 (TAT 6-24 HRS): SARS Coronavirus 2: NEGATIVE

## 2019-12-02 LAB — BRAIN NATRIURETIC PEPTIDE: B Natriuretic Peptide: 266.5 pg/mL — ABNORMAL HIGH (ref 0.0–100.0)

## 2019-12-02 MED ORDER — SODIUM CHLORIDE 0.9% FLUSH: 3.0000 mL | Freq: Once | INTRAVENOUS | Status: DC

## 2019-12-02 MED ORDER — ALBUTEROL SULFATE HFA 108 (90 BASE) MCG/ACT IN AERS
2.0000 | INHALATION_SPRAY | Freq: Once | RESPIRATORY_TRACT | Status: AC
  Administered 2019-12-02: 2 via RESPIRATORY_TRACT
  Filled 2019-12-02: qty 6.7

## 2019-12-02 NOTE — ED Triage Notes (Addendum)
Pt reports dry, non-productive cough with sob that started two weeks ago that has gotten progressively worse. Dyspnea at rest. Pt reports with a bad coughing episode, pt gets a focal headache on the right side. Pt also reports intermittent episodes of dizziness that last 2-3 minutes. Last episode was 2 days ago while in [place]. Denies pain at this time. Hx of Aflutter, CHF. Pt takes Eliquis.

## 2019-12-02 NOTE — Telephone Encounter (Signed)
Spoke with pt regarding his appointment scheduled for today at 11.30 am with Dr Volanda Napoleon, pt stated that he had developed a cough which is accompanied by shortness of breath for about one week, pt states that he gets winded when doing activity and while resting, pt state that he thought it was heart related and not COVID, Pt advised to call his cardiologist today  for appointment for further evaluation and if the SOB is severe to go to the ED per Dr Volanda Napoleon, pt agreed to call cardiology today to schedule appointment for further evaluation.Pt verbalized understanding, today appointment with Dr Volanda Napoleon was cancelled.

## 2019-12-02 NOTE — Discharge Instructions (Addendum)
  Your symptoms may be connected with your A. fib.  Follow-up with cardiology on this matter. Also follow-up with your primary care provider on this matter as they can direct any further evaluation or management.

## 2019-12-02 NOTE — ED Provider Notes (Signed)
[facility] EMERGENCY DEPARTMENT Provider Note   CSN: [id number] Arrival date & time: 12/02/19  1226     History Chief Complaint  Patient presents with  . Shortness of Breath  . Cough    [name] [name] is a 64 y.o. male.  HPI      [name] [name] is a 64 y.o. male, with a history of CAD, CHF, A. fib, HTN, hypercholesterolemia, DM, presenting to the ED with cough and shortness of breath for at least the last 2 weeks.  Symptoms are worse with exertion. Denies tobacco use, but uses marijuana about 2 times a week.  Pfeizer COVID vaccine completed "sometime in March." Denies fever/chills, chest pain, lower extremity edema/pain, orthopnea, abdominal pain, or any other complaints.    Past Medical History:  Diagnosis Date  . Anginal pain ([place])   . Anxiety   . Arrhythmia   . CAD (coronary artery disease), native coronary artery    a. Nonobstructive CAD by cath 2013 - diffuse distal and branch vessel CAD, no severe disease in the major coronaries, LV mild global hypokinesis, EF 45%. b. ETT-Sestamibi 5/14: EF 31%, small fixed inferior defect with no ischemia.  . Cholecystitis   . Chronic CHF ([place])    a. Mixed ICM/NICM (?EtOH). EF 35% in 2008. Echo 5/13: EF 60-65%, mod LVH, EF 45% on V gram in 12/2011. EF 12/2012: EF 50-55%, mild LVH, inferobasal HK, mild MR. ETT-Ses 5/14 EF 41%. Cardiac MRI 5/14: EF 44%, mild global HK, subepicardial delayed enhancement in nonspecific RV insertion pattern.  . COLONIC POLYPS, HX OF 12/30/2006  . Gout   . H/O atrial flutter 2007   a. Ablations in 2007, 2008.  [name] Heart murmur   . HEPATITIS B, CHRONIC 12/30/2006  . History of alcohol abuse   . History of hiatal hernia   . History of medication noncompliance   . History of nuclear stress test    Myoview 3/17: + chest pain; EF 33%, downsloping ST depression 2, V4-6, LVH with strain, no ischemia on images; intermediate risk   . HYPERCHOLESTEROLEMIA 07/11/2010  .  Hypertension   . Left sciatic nerve pain since 04/2015  . LIVER FUNCTION TESTS, ABNORMAL, HX OF 12/30/2006  . MITRAL REGURGITATION 12/30/2006  . 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 PAF (paroxysmal atrial fibrillation) ([facility])    On coumadin  . Rectal bleeding 12/18/2011   Scheduled for colonoscopy.    . Shortness of breath dyspnea    with excertion  . Sleep apnea    "suppose to send mask but they never did" (05/03/2015)  . Type II diabetes mellitus [facility])     Patient Active Problem List   Diagnosis Date Noted  . Generalized weakness 06/27/2019  . Acute on chronic congestive heart failure with left ventricular diastolic dysfunction ([place]) 06/26/2019  . Anemia due to chronic kidney disease 06/26/2019  . CKD (chronic kidney disease), stage III 06/26/2019  . Type 2 diabetes mellitus without complication ([place]) 09/73/5329  . Iron deficiency anemia due to chronic blood loss 06/26/2019  . History of atrial fibrillation   . History of type 2 diabetes mellitus   . Anemia   . Chest pain 04/24/2019  . Overweight (BMI 25.0-29.9) 02/28/2017  . Unstable angina ([place]) 08/03/2016  . S/P laparoscopic cholecystectomy 02/07/2016  . Coronary artery disease involving native coronary artery with unstable angina pectoris ([place]) 12/29/2015  . Diabetes ([place]) 11/12/2015  . Chronic anticoagulation 08/22/2015  . Atrial flutter with controlled response (  [place]) 08/22/2015  . RUQ pain   . Cholelithiases- drain in place 06/04/2015  . Essential hypertension   . Chronic diastolic CHF (congestive heart failure) ([place]) 04/24/2015  . Excessive daytime sleepiness 11/25/2014  . Obstructive sleep apnea 11/25/2014  . NICM (nonischemic cardiomyopathy) ([place]) 03/11/2014  . Anticoagulation goal of INR 2 to 3 10/19/2013  . Eczema 10/19/2013  . Low libido 10/16/2013  . Persistent atrial fibrillation ([place]) 10/06/2013  . Rectal bleeding 12/18/2011  . HYPERCHOLESTEROLEMIA 07/11/2010  . DENTAL CARIES 06/30/2010  .  GOUT 05/05/2009  . HEPATITIS B, CHRONIC 12/30/2006  . OSTEOCHONDROMA 12/30/2006  . Mitral valve disorder 12/30/2006  . LIVER FUNCTION TESTS, ABNORMAL, HX OF 12/30/2006  . COLONIC POLYPS, HX OF 12/30/2006  . COLONOSCOPY, HX OF 05/12/2001    Past Surgical History:  Procedure Laterality Date  . A FLUTTER ABLATION  2007, 2008   catheter ablation   . CARDIAC CATHETERIZATION N/A 05/03/2015   Procedure: Right/Left Heart Cath and Coronary Angiography;  Surgeon: Larey Dresser, MD;  Location: [facility] CV LAB;  Service: Cardiovascular;  Laterality: N/A;  . CARDIAC CATHETERIZATION N/A 05/03/2015   Procedure: Coronary Stent Intervention;  Surgeon: Jettie Booze, MD;  Location: [facility] CV LAB;  Service: Cardiovascular;  Laterality: N/A;  . CARDIAC CATHETERIZATION N/A 11/04/2015   Procedure: Left Heart Cath and Coronary Angiography;  Surgeon: Larey Dresser, MD;  Location: [facility] CV LAB;  Service: Cardiovascular;  Laterality: N/A;  . CARDIOVERSION N/A 08/24/2015   Procedure: CARDIOVERSION;  Surgeon: Thayer Headings, MD;  Location: [facility] ENDOSCOPY;  Service: Cardiovascular;  Laterality: N/A;  . CHOLECYSTECTOMY N/A 02/07/2016   Procedure: LAPAROSCOPIC CHOLECYSTECTOMY WITH  INTRAOPERATIVE CHOLANGIOGRAM;  Surgeon: Greer Pickerel, MD;  Location: [facility] ORS;  Service: General;  Laterality: N/A;  . COLONOSCOPY WITH PROPOFOL N/A 04/28/2019   Procedure: COLONOSCOPY WITH PROPOFOL;  Surgeon: Ronnette Juniper, MD;  Location: [facility];  Service: Gastroenterology;  Laterality: N/A;  . CORONARY ANGIOPLASTY  12/05/01  . ESOPHAGOGASTRODUODENOSCOPY (EGD) WITH PROPOFOL N/A 04/27/2019   Procedure: ESOPHAGOGASTRODUODENOSCOPY (EGD) WITH PROPOFOL;  Surgeon: Ronnette Juniper, MD;  Location: [facility];  Service: Gastroenterology;  Laterality: N/A;  . GIVENS CAPSULE STUDY N/A 06/29/2019   Procedure: GIVENS CAPSULE STUDY;  Surgeon: Wilford Corner, MD;  Location: [facility];  Service: Endoscopy;  Laterality: N/A;  . LEFT  HEART CATH AND CORONARY ANGIOGRAPHY N/A 12/28/2016   Procedure: Left Heart Cath and Coronary Angiography;  Surgeon: Larey Dresser, MD;  Location: [facility] CV LAB;  Service: Cardiovascular;  Laterality: N/A;  . LEFT HEART CATHETERIZATION WITH CORONARY ANGIOGRAM N/A 12/19/2011   Procedure: LEFT HEART CATHETERIZATION WITH CORONARY ANGIOGRAM;  Surgeon: Larey Dresser, MD;  Location: [facility] CATH LAB;  Service: Cardiovascular;  Laterality: N/A;  . OSTEOCHONDROMA EXCISION Left 1972   "took bone tumor off my shoulder"  . POLYPECTOMY  04/28/2019   Procedure: POLYPECTOMY;  Surgeon: Ronnette Juniper, MD;  Location: [facility] END[facility]COPY;  Service: Gastroenterology;;  . RIGHT/LEFT HEART CATH AND CORONARY ANGIOGRAPHY N/A 07/01/2019   Procedure: RIGHT/LEFT HEART CATH AND CORONARY ANGIOGRAPHY;  Surgeon: Larey Dresser, MD;  Location: [facility] CV LAB;  Service: Cardiovascular;  Laterality: N/A;       Family History  Problem Relation Age of Onset  . Diabetes Mother   . Hypertension Mother   . Heart attack Neg Hx   . Stroke Neg Hx     Social History   Tobacco Use  . Smoking status: Former Smoker    Packs/day: 4.00  Years: 18.00    Pack years: 72.00    Types: Cigarettes    Quit date: 08/14/1983    Years since quitting: 36.3  . Smokeless tobacco: Never Used  Substance Use Topics  . Alcohol use: Yes    Alcohol/week: 16.0 standard drinks    Types: 6 Cans of beer, 10 Shots of liquor per week    Comment: occasionally  . Drug use: Yes    Types: Marijuana    Comment: occasionally    Home Medications Prior to Admission medications   Medication Sig Start Date End Date Taking? Authorizing Provider  acetaminophen (TYLENOL) 500 MG tablet Take 1,000 mg by mouth every 6 (six) hours as needed (pain).   Yes [provider]  amLODipine (NORVASC) 5 MG tablet Take 1 tablet by mouth once daily 10/13/19  Yes Larey Dresser, MD  apixaban (ELIQUIS) 5 MG TABS tablet Take 1 tablet (5 mg total) by mouth 2 (two)  times daily. 08/31/19 12/02/19 Yes Bensimhon, Shaune Pascal, MD  atorvastatin (LIPITOR) 80 MG tablet Take 1 tablet (80 mg total) by mouth daily. 07/17/19  Yes Larey Dresser, MD  carvedilol (COREG) 25 MG tablet Take 1 tablet (25 mg total) by mouth 2 (two) times daily with a meal. 07/31/19  Yes Larey Dresser, MD  furosemide (LASIX) 40 MG tablet Take 1 tablet (40 mg total) by mouth 2 (two) times daily. 08/31/19 12/02/19 Yes Bensimhon, Shaune Pascal, MD  guaiFENesin (MUCINEX PO) Take 1 tablet by mouth 2 (two) times daily as needed (cough/congestion).   Yes [provider]  hydrALAZINE (APRESOLINE) 50 MG tablet Take 1.5 tablets (75 mg total) by mouth 2 (two) times daily. 08/31/19 12/02/19 Yes Bensimhon, Shaune Pascal, MD  insulin glargine (LANTUS SOLOSTAR) 100 UNIT/ML Solostar Pen Inject 35 Units into the skin every morning. And pen needles 1/day 11/20/19  Yes [name], MD  isosorbide mononitrate (IMDUR) 120 MG 24 hr tablet Take 1 tablet (120 mg total) by mouth daily. 08/31/19 12/02/19 Yes Bensimhon, Shaune Pascal, MD  metFORMIN (GLUCOPHAGE) 1000 MG tablet Take 1 tablet (1,000 mg total) by mouth 2 (two) times daily with a meal. 05/02/19  Yes [name], MD  nitroGLYCERIN (NITROSTAT) 0.4 MG SL tablet Place 1 tablet (0.4 mg total) under the tongue every 5 (five) minutes as needed for chest pain (x 3 tabs daily). Patient taking differently: Place 0.4 mg under the tongue every 5 (five) minutes x 3 doses as needed for chest pain.  09/29/18  Yes Charlott Rakes, MD  potassium chloride SA (KLOR-CON) 10 MEQ tablet Take 2 tablets (20 mEq total) by mouth daily. 07/13/19  Yes Clegg, Amy D, NP  sotalol (BETAPACE) 120 MG tablet Take 1 tablet (120 mg total) by mouth 2 (two) times daily. 08/31/19  Yes Bensimhon, Shaune Pascal, MD  ACCU-CHEK [facility] LANCETS lancets Use as instructed for three times daily testing of blood glucose 09/06/16   Jegede, Gabrielle Dare E, MD  Blood Glucose Monitoring Suppl (ACCU-CHEK AVIVA PLUS) w/Device KIT  Use as directed for 3 times daily testing of blood glucose 09/06/16   Jegede, Olugbemiga E, MD  glucose blood (ACCU-CHEK AVIVA PLUS) test strip Use as instructed for 3 times daily testing of blood glucose 09/06/16   Tresa Garter, MD  Lancet Devices [facility]) lancets Use as instructed for 3 times daily testing of blood glucose 09/06/16   Jegede, Olugbemiga E, MD  pantoprazole (PROTONIX) 40 MG tablet Take 1 tablet (40 mg total) by mouth daily. Patient  not taking: Reported on 12/02/2019 04/28/19 04/27/20  Swayze, Ava, DO  TRUEPLUS LANCETS 28G MISC 1 each by Does not apply route 3 (three) times daily. 08/22/16   Tresa Garter, MD    Allergies    Shrimp [shellfish allergy], Ace inhibitors, and Other  Review of Systems   Review of Systems  Constitutional: Negative for chills, diaphoresis and fever.  Respiratory: Positive for cough and shortness of breath.   Cardiovascular: Negative for chest pain and leg swelling.  Gastrointestinal: Negative for abdominal pain, diarrhea, nausea and vomiting.  Musculoskeletal: Negative for back pain and neck pain.  Neurological: Negative for dizziness, syncope, weakness and numbness.  All other systems reviewed and are negative.   Physical Exam Updated Vital Signs BP (!) 164/86   Pulse 61   Temp 98.4 F (36.9 C) (Oral)   Resp 20   Ht 6' (1.829 m)   Wt 96.6 kg   SpO2 100%   BMI 28.89 kg/m   Physical Exam Vitals and nursing note reviewed.  Constitutional:      General: He is not in acute distress.    Appearance: He is well-developed. He is not diaphoretic.  HENT:     Head: Normocephalic and atraumatic.     Mouth/Throat:     Mouth: Mucous membranes are moist.     Pharynx: Oropharynx is clear.  Eyes:     Conjunctiva/sclera: Conjunctivae normal.  Cardiovascular:     Rate and Rhythm: Normal rate and regular rhythm.     Pulses: Normal pulses.          Radial pulses are 2+ on the right side and 2+ on the left side.        Posterior tibial pulses are 2+ on the right side and 2+ on the left side.     Heart sounds: Normal heart sounds.     Comments: Tactile temperature in the extremities appropriate and equal bilaterally. Pulmonary:     Effort: Pulmonary effort is normal. No respiratory distress.     Breath sounds: Normal breath sounds.  Abdominal:     Palpations: Abdomen is soft.     Tenderness: There is no abdominal tenderness. There is no guarding.  Musculoskeletal:     Cervical back: Neck supple.     Right lower leg: No edema.     Left lower leg: No edema.  Lymphadenopathy:     Cervical: No cervical adenopathy.  Skin:    General: Skin is warm and dry.  Neurological:     Mental Status: He is alert.  Psychiatric:        Mood and Affect: Mood and affect normal.        Speech: Speech normal.        Behavior: Behavior normal.     ED Results / Procedures / Treatments   Labs (all labs ordered are listed, but only abnormal results are displayed) Labs Reviewed  BASIC METABOLIC PANEL - Abnormal; Notable for the following components:      Result Value   CO2 20 (*)    Glucose, Bld 284 (*)    Creatinine, Ser 1.43 (*)    GFR calc non Af Amer 51 (*)    GFR calc Af Amer 59 (*)    All other components within normal limits  CBC - Abnormal; Notable for the following components:   Hemoglobin 11.0 (*)    HCT 37.7 (*)    MCV 71.7 (*)    MCH 20.9 (*)    MCHC 29.2 (*)  RDW 21.5 (*)    All other components within normal limits  BRAIN NATRIURETIC PEPTIDE - Abnormal; Notable for the following components:   B Natriuretic Peptide 266.5 (*)    All other components within normal limits  SARS CORONAVIRUS 2 (TAT 6-24 HRS)    BUN  Date Value Ref Range Status  12/02/2019 15 8 - 23 mg/dL Final  08/18/2019 15 8 - 23 mg/dL Final  07/13/2019 15 8 - 23 mg/dL Final  07/02/2019 14 8 - 23 mg/dL Final  04/08/2018 9 8 - 27 mg/dL Final  07/29/2017 11 8 - 27 mg/dL Final   Creat  Date Value Ref Range Status    08/03/2016 1.20 0.70 - 1.25 mg/dL Final    Comment:      For patients > or = 66 years of age: The upper reference limit for Creatinine is approximately 13% higher for people identified as African-American.     01/20/2016 1.16 0.70 - 1.25 mg/dL Final  11/01/2015 1.00 0.70 - 1.25 mg/dL Final  10/20/2015 1.18 0.70 - 1.25 mg/dL Final   Creatinine, Ser  Date Value Ref Range Status  12/02/2019 1.43 (H) 0.61 - 1.24 mg/dL Final  08/18/2019 1.14 0.61 - 1.24 mg/dL Final  07/13/2019 1.33 (H) 0.61 - 1.24 mg/dL Final  07/02/2019 1.32 (H) 0.61 - 1.24 mg/dL Final     EKG EKG Interpretation  Date/Time:  Wednesday December 02 2019 12:29:47 EDT Ventricular Rate:  72 PR Interval:    QRS Duration: 86 QT Interval:  446 QTC Calculation: 488 R Axis:   69 Text Interpretation: recurrent Atrial flutter with variable A-V block Abnormal QRS-T angle, consider primary T wave abnormality Prolonged QT Confirmed by Blanchie Dessert [id number]) on 12/02/2019 4:32:12 PM   Radiology DG Chest 2 View  Result Date: 12/02/2019 CLINICAL DATA:  Nonproductive cough with shortness of breath beginning 2 weeks ago, progressively worsening. Dyspnea at rest. EXAM: CHEST - 2 VIEW COMPARISON:  06/26/2019 FINDINGS: Cardiac silhouette is normal in size. No mediastinal or hilar masses or evidence of adenopathy. Lungs demonstrate mildly thickened interstitial markings including the fissures, similar to prior studies. No lung consolidation. No pleural effusion or pneumothorax. Skeletal structures are intact. IMPRESSION: No acute cardiopulmonary disease. Electronically Signed   By: Lajean Manes M.D.   On: 12/02/2019 13:15    Procedures Procedures (including critical care time)  Medications Ordered in ED Medications  albuterol (VENTOLIN HFA) 108 (90 Base) MCG/ACT inhaler 2 puff (2 puffs Inhalation Given 12/02/19 1653)    ED Course  I have reviewed the triage vital signs and the nursing notes.  Pertinent labs & imaging results  that were available during my care of the patient were reviewed by me and considered in my medical decision making (see chart for details).  Clinical Course as of Dec 03 43  Wed Dec 02, 2019  1822 Previously 290.  B Natriuretic Peptide(!): 266.5 [SJ]    Clinical Course User Index [SJ] Montanna Mcbain, Helane Gunther, PA-C   MDM Rules/Calculators/A&P                      Patient presents with multiple weeks of cough and shortness of breath. Patient is nontoxic appearing, afebrile, not tachycardic, not tachypneic, not hypotensive, maintains excellent SPO2 on room air, and is in no apparent distress.   I have reviewed the patient's chart to obtain more information.   I reviewed and interpreted the patient's labs and radiological studies. BNP consistent with previous.  No leukocytosis. Patient may  be having symptoms from going in and out of A. fib.  He is rate controlled and anticoagulated.  We we will have the patient follow-up with cardiology and PCP.  Findings and plan of care discussed with Blanchie Dessert, MD. Dr. Maryan Rued personally evaluated and examined this patient.  Final Clinical Impression(s) / ED Diagnoses Final diagnoses:  Cough  Shortness of breath    Rx / DC Orders ED Discharge Orders    None       Layla Maw 12/03/19 0048    Blanchie Dessert, MD 12/04/19 1528

## 2019-12-07 ENCOUNTER — Other Ambulatory Visit: Payer: Self-pay

## 2019-12-07 ENCOUNTER — Ambulatory Visit (HOSPITAL_COMMUNITY)
Admission: RE | Admit: 2019-12-07 | Discharge: 2019-12-07 | Disposition: A | Discharge status: Home / Self Care | Payer: Medicare Other | Source: Ambulatory Visit | Attending: Cardiology | Admitting: Cardiology

## 2019-12-07 ENCOUNTER — Encounter (HOSPITAL_COMMUNITY): Payer: Self-pay

## 2019-12-07 VITALS — BP 130/78 | HR 74 | Wt 205.4 lb

## 2019-12-07 DIAGNOSIS — Z8619 Personal history of other infectious and parasitic diseases: Secondary | ICD-10-CM | POA: Diagnosis not present

## 2019-12-07 DIAGNOSIS — Z9049 Acquired absence of other specified parts of digestive tract: Secondary | ICD-10-CM | POA: Diagnosis not present

## 2019-12-07 DIAGNOSIS — N189 Chronic kidney disease, unspecified: Secondary | ICD-10-CM | POA: Insufficient documentation

## 2019-12-07 DIAGNOSIS — Z87891 Personal history of nicotine dependence: Secondary | ICD-10-CM | POA: Diagnosis not present

## 2019-12-07 DIAGNOSIS — Z8249 Family history of ischemic heart disease and other diseases of the circulatory system: Secondary | ICD-10-CM | POA: Diagnosis not present

## 2019-12-07 DIAGNOSIS — E785 Hyperlipidemia, unspecified: Secondary | ICD-10-CM | POA: Insufficient documentation

## 2019-12-07 DIAGNOSIS — G4733 Obstructive sleep apnea (adult) (pediatric): Secondary | ICD-10-CM

## 2019-12-07 DIAGNOSIS — R55 Syncope and collapse: Secondary | ICD-10-CM

## 2019-12-07 DIAGNOSIS — R05 Cough: Secondary | ICD-10-CM | POA: Diagnosis not present

## 2019-12-07 DIAGNOSIS — E1122 Type 2 diabetes mellitus with diabetic chronic kidney disease: Secondary | ICD-10-CM | POA: Insufficient documentation

## 2019-12-07 DIAGNOSIS — I4892 Unspecified atrial flutter: Secondary | ICD-10-CM | POA: Insufficient documentation

## 2019-12-07 DIAGNOSIS — I5033 Acute on chronic diastolic (congestive) heart failure: Secondary | ICD-10-CM

## 2019-12-07 DIAGNOSIS — Z79899 Other long term (current) drug therapy: Secondary | ICD-10-CM | POA: Diagnosis not present

## 2019-12-07 DIAGNOSIS — I130 Hypertensive heart and chronic kidney disease with heart failure and stage 1 through stage 4 chronic kidney disease, or unspecified chronic kidney disease: Secondary | ICD-10-CM | POA: Insufficient documentation

## 2019-12-07 DIAGNOSIS — Z955 Presence of coronary angioplasty implant and graft: Secondary | ICD-10-CM | POA: Insufficient documentation

## 2019-12-07 DIAGNOSIS — I480 Paroxysmal atrial fibrillation: Secondary | ICD-10-CM

## 2019-12-07 DIAGNOSIS — Z7901 Long term (current) use of anticoagulants: Secondary | ICD-10-CM | POA: Insufficient documentation

## 2019-12-07 DIAGNOSIS — Z794 Long term (current) use of insulin: Secondary | ICD-10-CM | POA: Diagnosis not present

## 2019-12-07 DIAGNOSIS — I2510 Atherosclerotic heart disease of native coronary artery without angina pectoris: Secondary | ICD-10-CM | POA: Diagnosis not present

## 2019-12-07 NOTE — Progress Notes (Signed)
Patient ID: [name] [name], male   DOB: 09/19/1955, 64 y.o.   MRN: [id number] PCP: Dr. Volanda Napoleon  Cardiology: Dr. Aundra Dubin  Reason for Visit/CC: Post ED f/u for Exertional Dyspnea and Syncope   64 y.o. with history of HTN, diabetes, paroxysmal atrial fibrillation, and CAD presents for cardiology followup.  He was admitted in 5/13 with chest pain with exertion.  LHC was done showing global HK with EF 45% and diffuse, severe distal and branch vessel disease.  There was no interventional option, but it was suspect that this disease could be causing his anginal-type pain.  Echo at that time was read as showing EF 60-65%.  He was admitted in 5/14 with hypertensive urgency and chest pain.  He ruled out for MI and BP was controlled.  His EF was 50-55% by echo.  ETT-Sestamibi done as outpatient showed small fixed inferior defect with no ischemia but EF was 31%.  He was quite hypertensive during the study.  Cardiac MRI done to confirm EF in 5/14 showed EF 44%.  He had an episode of atrial fibrillation/RVR in 3/15 but went back into NSR.  Echo in 2/16 showed EF 55-60% with moderate diastolic dysfunction.   He had a Cardiolite in 9/16 showing inferior ischemia.  He was having exertional dyspnea with less activity than normal.  There was concern for worsened CAD and he was sent for [facility] in 9/16. By RHC, filling pressures were not significantly elevated. He had a severe distal RCA stenosis that was treated with DES x 2.  Echo showed EF 55-60%.    In 10/16, he was admitted with acute cholecystitis.  He received a cholecystostomy tube.  Cholecystectomy done in 6/17.   Atrial fibrillation in 1/17, cardioverted to NSR.   Recurrent concerning chest pain in 3/17.  Repeat LHC showed diffuse distal and branch vessel disease consistent with poorly-controlled DM, but patent RCA stents.    He was admitted with atrial fibrillation in 12/17.  Started on sotalol and converted to NSR.    He had recurrent chest pain in  5/18 with abnormal Cardiolite.  Cath was done again in 5/18, showing diffuse CAD with no good interventional options.   Admitted11/2020 with increased dyspnea and exertional chest pain. Hgb went down to 8.1. GI consulted.  Had [place] that showed severe distal and branch vessel disease but no interventional targets and not good anatomy for CABG. Continued on medical management. Capsule Endoscopy without recent or active bleeding, no AVMs. He went into atrial flutter during his hospitalization. He went back into NSR, however, after discharge.      He presents to clinic today for post ED f/u. Was seen in [facility] ED last week for worsening exertional dyspnea, dry cough and syncopal spells in the setting of coughing fits. No CP. EKG showed rate control atrial flutter. Per ED note, there was "low suspicion for ACS, dissection or PE.  No evidence of PNA or CHF". SOB felt to be related to seasonal allergies vs symptomatic aflutter. He was given a Rx for an inhaler and advised to continue on AAD therapy and anticoagulation for atrial flutter/ fib and to f/u in outpatient clinic. I have reviewed labs from ED visit, BNP was mildly elevated at 266. CBC showed stable chronic anemia, Hgb 11. WBC normal. BMP w/ AKI, Scr 1.4 (baseline 1.14). Covid negative. They did not check troponin. CXR showed no overt edema. No infiltrates.   He is here today w/ his daughter. Continues to have exertional dyspnea that improves  w/ rest. Also endorsed recent orthopnea. Now sleeping on 3 pillows (previously 1-2 pillows). No further syncope since ED visit. Denies palpitations. Complains of HAs. ReDs Clip measurement elevated at 40%.   He admits to poor compliance w/ his home meds, particularly his evening meds including his lasix. Usually good about taking morning meds.   EKG repeated today and shows mild inferolateral ST depressions, new from last EKG 1/21. He is in NSR today.     Labs (5/13): K 3.6, creatinine 0.93, LDL 85, HDL 57 Labs (7/13):  K 3.9, creatinine 1.1, LDL 57, HDL 51  Labs (5/14): K 4, creatinine 1.25 Labs (8/14): K 3.7, creatinine 0.9, LDL 103, HDL 42 Labs (8/15): K 3.5 creatinine 1.0, BNP 75 Labs (2/16): K 3.8, creatinine 1.1 Labs (9/16): K 3.5, creatinine 1.03, BNP 59, HDL 40, LDL 111, TGs 333 Labs (11/16): K 3.6, creaitnine 1.07, LFTs normal, LDL 44 Labs (6/17): K 3.4, creatinine 1.52 => 1.13 Labs (1/18): K 3.9, creatinine 1.25 Labs (6/18): K 3.8, creatinine 1.21, HCT 39.7 Labs (8/18): LDL 39, HDL 43 Labs (12/18): 4.4, creatinine 1.25, LFTs normal Labs (8/19): K 3.6, creatinine 1.13, LDL 68, HDL 49  Labs (11/20): K 3.5, creatinine 1.33, LDL 25 Labs (4/21): K 4.0, SCr 1.14, BNP 266  ECG (personally reviewed): NSR w/ 1st degree AV block, new mild inferolateral  ST changes compared to previous EKG.   PMH: 1. DM2 2. Gout 3. HTN: Cough with ACEI.  4. Cardiomyopathy: Suspect mixed ischemic/nonischemic (?ETOH-related).  EF 35% in 2008.  Echo in 5/13 showed EF 60-65% with moderate LVH but EF was 45% on LV-gram in 5/13.  Echo (5/14) with EF 50-55%, mild LVH, inferobasal HK, mild MR.  ETT-Sestamibi in 5/14, however, showed EF 31%.  Cardiac MRI (6/14) with EF 44%, mild global hypokinesis, subepicardial delayed enhancement in a nonspecific RV insertion site pattern.  Echo (2/16) with EF 55-60%, moderate diastolic dysfunction. RHC (9/16) with mean RA 8, PA 39/20 mean 28, mean PCWP 14, CI 2.2. Echo (9/16) with EF 55-60%, grade II diastolic dysfunction.  - Echo (6/17) with EF 55-60%, moderate LVH, grade II diastolic dysfunction, mild AI, mild MR.  - RHC (11/20): mean RA 4, PA 46/14 mean 26, mean PCWP 13, CI 3.23 - Echo (9/20): EF 60-65%, moderate LVH, normal RV size and systolic function, mild-moderate MR, mild AI.  5. CAD: LHC in 2010 with mild nonobstructive disease.  LHC (5/13) with diffuse distal and branch vessel disease, mild global hypokinesis and EF 45%.  ETT-Sestamibi (5/14): EF 31%, small fixed inferior defect with  no ischemia.  Lexiscan Cardiolite (9/16) with EF 26%, moderate inferior defect that was partially reversible, suggesting ischemia => High risk study. LHC (9/16) with 40-50% mLAD, diffuse up to 50% distal LAD, 90% ostium of branch off ramus, 95% dRCA => DES to RCA x 2, EF 55-60%.   - LHC (3/17) with diffuse branch and distal vessel disease c/w poorly controlled DM, RCA stents patent.  - LHC (5/18): Diffuse CAD with no good interventional option. 60% mRCA, 90% dLAD, 50% serial mid ramus stenoses, ramus branches with ostial 80-90% stenoses. - LHC (11/20): Severe distal and branch vessel disease but no interventional targets and not good anatomy for CABG  6. H/o chronic HBV 7. Osteochondroma left shoulder.  8. Hyperlipidemia 9. Paroxysmal atrial fibrillation: Coumadin.  Developed cough and increased ESR with amiodarone.  DCCV in 1/17.  Recurrent atrial fibrillation in 12/17, sotalol started.  10. Atrial flutter: had ablations in 2007 and 2008.  11. OSA on CPAP.  12. Acute cholecystitis (10/16): Cholecystostomy tube placed.  Cholecystectomy 6/17.  13. CKD 14. Anemia - Suspected GI bleed but EGD/c-scope 9/20 without significant findings.  - Capsule Endoscopy (11/20) without recent or active bleeding, no AVMs.   SH: Married, prior smoker.  Some ETOH, occasionally heavy in past.  Does use occasional marijuana. Out of work Engineer, drilling. 2 daughters in grad school.   FH: CAD  ROS: All systems reviewed and negative except as per HPI.   Current Outpatient Medications  Medication Sig Dispense Refill  . ACCU-CHEK SOFTCLIX LANCETS lancets Use as instructed for three times daily testing of blood glucose 100 each 12  . acetaminophen (TYLENOL) 500 MG tablet Take 1,000 mg by mouth every 6 (six) hours as needed (pain).    [name] amLODipine (NORVASC) 5 MG tablet Take 1 tablet by mouth once daily 90 tablet 3  . apixaban (ELIQUIS) 5 MG TABS tablet Take 1 tablet (5 mg total) by mouth 2 (two) times daily. 60 tablet 3  .  atorvastatin (LIPITOR) 80 MG tablet Take 1 tablet (80 mg total) by mouth daily. 90 tablet 1  . Blood Glucose Monitoring Suppl (ACCU-CHEK AVIVA PLUS) w/Device KIT Use as directed for 3 times daily testing of blood glucose 1 kit 0  . carvedilol (COREG) 25 MG tablet Take 1 tablet (25 mg total) by mouth 2 (two) times daily with a meal. 180 tablet 1  . furosemide (LASIX) 40 MG tablet Take 1 tablet (40 mg total) by mouth 2 (two) times daily. 60 tablet 3  . glucose blood (ACCU-CHEK AVIVA PLUS) test strip Use as instructed for 3 times daily testing of blood glucose 100 each 12  . guaiFENesin (MUCINEX PO) Take 1 tablet by mouth 2 (two) times daily as needed (cough/congestion).    . hydrALAZINE (APRESOLINE) 50 MG tablet Take 1.5 tablets (75 mg total) by mouth 2 (two) times daily. 90 tablet 3  . insulin glargine (LANTUS SOLOSTAR) 100 UNIT/ML Solostar Pen Inject 35 Units into the skin every morning. And pen needles 1/day 30 mL 3  . isosorbide mononitrate (IMDUR) 120 MG 24 hr tablet Take 1 tablet (120 mg total) by mouth daily. 30 tablet 3  . Lancet Devices (ACCU-CHEK SOFTCLIX) lancets Use as instructed for 3 times daily testing of blood glucose 1 each 0  . metFORMIN (GLUCOPHAGE) 1000 MG tablet Take 1 tablet (1,000 mg total) by mouth 2 (two) times daily with a meal. 180 tablet 1  . sotalol (BETAPACE) 120 MG tablet Take 1 tablet (120 mg total) by mouth 2 (two) times daily. 60 tablet 3  . TRUEPLUS LANCETS 28G MISC 1 each by Does not apply route 3 (three) times daily. 100 each 12  . nitroGLYCERIN (NITROSTAT) 0.4 MG SL tablet Place 1 tablet (0.4 mg total) under the tongue every 5 (five) minutes as needed for chest pain (x 3 tabs daily). (Patient not taking: Reported on 12/07/2019) 60 tablet 1   No current facility-administered medications for this encounter.    BP 130/78   Pulse 74   Wt 93.2 kg (205 lb 6.4 oz)   SpO2 98%   BMI 27.86 kg/m   Wt Readings from Last 3 Encounters:  12/07/19 93.2 kg (205 lb 6.4 oz)    12/02/19 96.6 kg (213 lb)  08/20/19 92.9 kg (204 lb 12.9 oz)   PHYSICAL EXAM: ReDs Clip 40% General:  Well appearing. No respiratory difficulty HEENT: normal Neck: supple. no JVD. Carotids 2+ bilat; no bruits. No lymphadenopathy  or thyromegaly appreciated. Cor: PMI nondisplaced. Regular rate & rhythm. No rubs, gallops or murmurs. Lungs: clear Abdomen: soft, nontender, nondistended. No hepatosplenomegaly. No bruits or masses. Good bowel sounds. Extremities: no cyanosis, clubbing, rash, edema Neuro: alert & oriented x 3, cranial nerves grossly intact. moves all 4 extremities w/o difficulty. Affect pleasant.  Assessment/Plan:  1. Acute on Chronic Diastolic CHF: EF 76% on cardiac MRI in 6/14 but EF improved back to 55-60% on 2/16 echo.  However, EF down to 26% on Cardiolite in 9/16.  Echo in 9/20 showed EF 60-65%, moderate LVH, normal RV, mild-moderate MR.   - Now w/ increased exertional dyspnea>>NYHA class III symptoms + orthopnea and  volume overload.  ReDs Clip 40%. BNP in ED 266.  This is in the setting of poor compliance w/ diuretics.  - Increase Lasix to 60 mg bid x 3 days, then return to 40 bid. - Check BMP in 1 week   2. Atrial fibrillation/flutter: Paroxysmal. In NSR today but was in atrial flutter w/ CVR on EKG 4/21.  - He is on AAD therapy sotalol w/ breakthrough afib/flutter. QTc ok on today's ECG.  - Will get Zio patch x 2 weeks to assess burden (also for syncope) and refer back to afib clinic after monitor is completed - suspect untreated OSA may be contributing. Will need repeat sleep study evaluation  - He reports compliance w/ Eliquis. No abnormal bleeding. Recent CBC stable.     3. Coronary artery disease: LHC in 9/16 showed diffuse CAD, most significant involving the distal RCA.  Patient had DES x 2 to RCA.  Cath in 3/17 with patent RCA stents and diffuse distal and branch vessel disease concerning for poorly controlled diabetes.  Cath in 5/18 again showed diffuse CAD  without good interventional target.  Cath in 11/20 showed severe distal and branch vessel disease but no interventional targets and not good anatomy for CABG.   - He has had recent exertional dyspnea but no chest pain. Suspect likely 2/2 acute on chronic CHF and AF/AFL. However EKG also shows new mild inferolateral ST depressions, concerning for ischemia.  - LHC 06/2019 showed patent proximal and distal RCA stents, distal stent with 30% in-stent restenosis, 50-60% stenosis proximal RCA just distal to stent, diffuse up to 60% mid RCA stenosis and long up to 70% ostial and proximal LCx stenosis. ? If exertional dyspnea is anginal equivalent. Will see if symptoms resolve w/ diuresis. If persistent symptoms, will consider re-look LHC to reassess RCA and LCx for interval progression. ? Worsening ISR. May need FFR analysis and potential balloon angioplasty. Will discuss potential cath w/ Dr. Aundra Dubin. For now, continue medical therapy  - No ASA given Eliquis use.  - Continue atorvastatin 80 mg daily, good lipids 06/2019  - Continue Coreg, amlodipine, and Imdur 120 mg daily for angina control.   4. Hyperlipidemia:  On atorvastatin, good lipids 06/2019   5. Hypertension: Controlled on current regimen.    6. OSA: not on CPAP. Did not tolerate previously. We discussed CV affects of untreated OSA including potential for worsening HF and recurrent atrial arrhthymias. He is willing to retry CPAP. Will order home sleep study for repeat assessment.   7. CKD: Check BMP in 1 week w/ diuretic increase   8. Syncope: based on description of events, seems to occur w coughing spells but need to r/o arrhythmias given cardiac history outlined above. He has had breakthrough afib/flutter despite sotalol.  - order 2 week Zio Patch  - may  need  Repeat cath. See above  - cough may be related to volume overload  He has close f/u with Dr. Aundra Dubin next week, which he plans to keep  Lyda Jester, PA-C 12/07/2019

## 2019-12-07 NOTE — Patient Instructions (Addendum)
Increase Furosemide to 60 mg (1 & 1/2 tabs) Twice daily FOR 3 DAYS ONLY then back to 40 mg Twice daily   Your provider has recommended that  you wear a Zio Patch for 14 days.  This monitor will record your heart rhythm for our review.  IF you have any symptoms while wearing the monitor please press the button.  If you have any issues with the patch or you notice a red or orange light on it please call the company at [phone].  Once you remove the patch please mail it back to the company as soon as possible so we can get the results.  You have been referred to [facility], they will call you for an appointment  Your physician recommends that you schedule a follow-up appointment in: 1 week  Your provider has recommended that you have a home sleep study.  [employer] is the company that does these test.  They will contact you by phone and must speak with you before they can ship the equipment.  Once they have spoken with you they will send the equipment right to your home with instructions on how to set it up.  Once you have completed the test you just dispose of the equipment, the information is automatically uploaded to [place] via blue-tooth technology.  IF you have any questions or issues with the equipment please call the company directly at [phone].  If your test is positive for sleep apnea and you need a home CPAP machine you will be contacted by Dr Theodosia Blender office [facility]) to set this up.  If you have any questions or concerns before your next appointment please send [place] a message through [place] or call our office at [phone].  At the [facility], you and your health needs are our priority. As part of our continuing mission to provide you with exceptional heart care, we have created designated Provider Care Teams. These Care Teams include your primary Cardiologist (physician) and Advanced Practice Providers (APPs- Physician Assistants and Nurse Practitioners) who  all work together to provide you with the care you need, when you need it.   You may see any of the following providers on your designated Care Team at your next follow up: [name] Dr Glori Bickers . Dr Loralie Champagne . Darrick Grinder, NP . Lyda Jester, PA . Audry Riles, PharmD   Please be sure to bring in all your medications bottles to every appointment.

## 2019-12-08 ENCOUNTER — Other Ambulatory Visit (HOSPITAL_COMMUNITY): Payer: Self-pay | Admitting: Cardiology

## 2019-12-08 DIAGNOSIS — I484 Atypical atrial flutter: Secondary | ICD-10-CM | POA: Diagnosis not present

## 2019-12-10 NOTE — Progress Notes (Signed)
Height:  6'0"    Weight: 205 lb BMI: 27.86  Today's Date: 12/07/19  STOP BANG RISK ASSESSMENT S (snore) Have you been told that you snore?     YES   T (tired) Are you often tired, fatigued, or sleepy during the day?   YES  O (obstruction) Do you stop breathing, choke, or gasp during sleep? NO   P (pressure) Do you have or are you being treated for high blood pressure? YES   B (BMI) Is your body index greater than 35 kg/m? NO   A (age) Are you 66 years old or older? YES   N (neck) Do you have a neck circumference greater than 16 inches?      G (gender) Are you a male? YES   TOTAL STOP/BANG "YES" ANSWERS 5                                                                       For Office Use Only              Procedure Order Form    YES to 3+ Stop Bang questions OR two clinical symptoms - patient qualifies for WatchPAT (CPT 95800)      Clinical Notes: Will consult Sleep Specialist and refer for management of therapy due to patient increased risk of Sleep Apnea. Ordering a sleep study due to the following two clinical symptoms: Excessive daytime sleepiness G47.10 / Loud snoring R06.83   Which test do you need, WP1 or WP300???  . Do you have access to a smart device containing the app stores?  If YES, then -->WP1   If NO, then  --->WP300

## 2019-12-10 NOTE — Addendum Note (Signed)
Encounter addended by: Scarlette Calico, RN on: 12/10/2019 5:09 PM  Actions taken: Clinical Note Signed

## 2019-12-11 ENCOUNTER — Telehealth (HOSPITAL_COMMUNITY): Payer: Self-pay | Admitting: *Deleted

## 2019-12-11 NOTE — Telephone Encounter (Signed)
Order, OV note, stop bang and demographics all faxed to [employer] at [phone]  

## 2019-12-15 ENCOUNTER — Encounter (HOSPITAL_COMMUNITY): Payer: Medicare Other

## 2019-12-16 ENCOUNTER — Ambulatory Visit (HOSPITAL_COMMUNITY)
Admission: RE | Admit: 2019-12-16 | Discharge: 2019-12-16 | Disposition: A | Discharge status: Home / Self Care | Payer: Medicare Other | Source: Ambulatory Visit | Attending: Cardiology | Admitting: Cardiology

## 2019-12-16 ENCOUNTER — Encounter (HOSPITAL_COMMUNITY): Payer: Self-pay | Admitting: Cardiology

## 2019-12-16 ENCOUNTER — Other Ambulatory Visit: Payer: Self-pay

## 2019-12-16 VITALS — BP 140/70 | HR 75 | Wt 209.0 lb

## 2019-12-16 DIAGNOSIS — Z79899 Other long term (current) drug therapy: Secondary | ICD-10-CM | POA: Insufficient documentation

## 2019-12-16 DIAGNOSIS — I480 Paroxysmal atrial fibrillation: Secondary | ICD-10-CM | POA: Insufficient documentation

## 2019-12-16 DIAGNOSIS — I4891 Unspecified atrial fibrillation: Secondary | ICD-10-CM | POA: Diagnosis not present

## 2019-12-16 DIAGNOSIS — Z794 Long term (current) use of insulin: Secondary | ICD-10-CM | POA: Diagnosis not present

## 2019-12-16 DIAGNOSIS — Z7901 Long term (current) use of anticoagulants: Secondary | ICD-10-CM | POA: Diagnosis not present

## 2019-12-16 DIAGNOSIS — G4733 Obstructive sleep apnea (adult) (pediatric): Secondary | ICD-10-CM | POA: Diagnosis not present

## 2019-12-16 DIAGNOSIS — I5032 Chronic diastolic (congestive) heart failure: Secondary | ICD-10-CM | POA: Diagnosis not present

## 2019-12-16 DIAGNOSIS — N189 Chronic kidney disease, unspecified: Secondary | ICD-10-CM | POA: Diagnosis not present

## 2019-12-16 DIAGNOSIS — E785 Hyperlipidemia, unspecified: Secondary | ICD-10-CM | POA: Insufficient documentation

## 2019-12-16 DIAGNOSIS — R55 Syncope and collapse: Secondary | ICD-10-CM | POA: Diagnosis not present

## 2019-12-16 DIAGNOSIS — B181 Chronic viral hepatitis B without delta-agent: Secondary | ICD-10-CM | POA: Diagnosis not present

## 2019-12-16 DIAGNOSIS — I4892 Unspecified atrial flutter: Secondary | ICD-10-CM | POA: Diagnosis not present

## 2019-12-16 DIAGNOSIS — R0602 Shortness of breath: Secondary | ICD-10-CM | POA: Diagnosis present

## 2019-12-16 DIAGNOSIS — I25119 Atherosclerotic heart disease of native coronary artery with unspecified angina pectoris: Secondary | ICD-10-CM | POA: Insufficient documentation

## 2019-12-16 DIAGNOSIS — Z955 Presence of coronary angioplasty implant and graft: Secondary | ICD-10-CM | POA: Diagnosis not present

## 2019-12-16 DIAGNOSIS — I130 Hypertensive heart and chronic kidney disease with heart failure and stage 1 through stage 4 chronic kidney disease, or unspecified chronic kidney disease: Secondary | ICD-10-CM | POA: Diagnosis not present

## 2019-12-16 DIAGNOSIS — Z87891 Personal history of nicotine dependence: Secondary | ICD-10-CM | POA: Insufficient documentation

## 2019-12-16 DIAGNOSIS — E1122 Type 2 diabetes mellitus with diabetic chronic kidney disease: Secondary | ICD-10-CM | POA: Insufficient documentation

## 2019-12-16 LAB — BASIC METABOLIC PANEL
Anion gap: 8 (ref 5–15)
BUN: 10 mg/dL (ref 8–23)
CO2: 25 mmol/L (ref 22–32)
Calcium: 9.6 mg/dL (ref 8.9–10.3)
Chloride: 105 mmol/L (ref 98–111)
Creatinine, Ser: 1.14 mg/dL (ref 0.61–1.24)
GFR calc Af Amer: >60 mL/min (ref >60)
GFR calc non Af Amer: >60 mL/min (ref >60)
Glucose, Bld: 88 mg/dL (ref 70–99)
Potassium: 3.3 mmol/L — ABNORMAL LOW (ref 3.5–5.1)
Sodium: 138 mmol/L (ref 135–145)

## 2019-12-16 MED ORDER — POTASSIUM CHLORIDE CRYS ER 20 MEQ PO TBCR: 40.0000 meq | EXTENDED_RELEASE_TABLET | Freq: Every day | ORAL | 3 refills | Status: DC

## 2019-12-16 MED ORDER — FUROSEMIDE 40 MG PO TABS: ORAL_TABLET | ORAL | 3 refills | Status: DC

## 2019-12-16 NOTE — Patient Instructions (Addendum)
INCREASE Potassium to 40 meq (2 tabs) daily   INCREASE Lasix to 80mg  (2 tabs) in the morning and 40mg  (2 tabs) in the evening   Labs today and repeat in 10  We will only contact you if something comes back abnormal or we need to make some changes. Otherwise no news is good news!   You have been referred to Electrophysiology with Dr Rayann Heman for an Afib ablation. His office will call you to schedule this appointment.    Your physician recommends that you schedule a follow-up appointment in: 3 weeks with the Nurse Practitioner  May Garage Code [id number].    Please call office at [phone] option [phone] if you have any questions or concerns.    At the [facility], you and your health needs are our priority. As part of our continuing mission to provide you with exceptional heart care, we have created designated Provider Care Teams. These Care Teams include your primary Cardiologist (physician) and Advanced Practice Providers (APPs- Physician Assistants and Nurse Practitioners) who all work together to provide you with the care you need, when you need it.   You may see any of the following providers on your designated Care Team at your next follow up: [name] Dr Glori Bickers . Dr Loralie Champagne . Darrick Grinder, NP . Lyda Jester, PA . Audry Riles, PharmD   Please be sure to bring in all your medications bottles to every appointment.

## 2019-12-16 NOTE — Progress Notes (Signed)
Patient ID: [name] [name], male   DOB: [birth year], 64 y.o.   MRN: [id number] PCP: [name] Ruddy, MD Cardiology: Dr. Aundra Dubin  64 y.o. with history of HTN, diabetes, paroxysmal atrial fibrillation, and CAD presents for cardiology followup.  He was admitted in 5/13 with chest pain with exertion.  LHC was done showing global HK with EF 45% and diffuse, severe distal and branch vessel disease.  There was no interventional option, but it was suspect that this disease could be causing his anginal-type pain.  Echo at that time was read as showing EF 60-65%.  He was admitted in 5/14 with hypertensive urgency and chest pain.  He ruled out for MI and BP was controlled.  His EF was 50-55% by echo.  ETT-Sestamibi done as outpatient showed small fixed inferior defect with no ischemia but EF was 31%.  He was quite hypertensive during the study.  Cardiac MRI done to confirm EF in 5/14 showed EF 44%.  He had an episode of atrial fibrillation/RVR in 3/15 but went back into NSR.  Echo in 2/16 showed EF 55-60% with moderate diastolic dysfunction.   He had a Cardiolite in 9/16 showing inferior ischemia.  He was having exertional dyspnea with less activity than normal.  There was concern for worsened CAD and he was sent for [facility] in 9/16. By RHC, filling pressures were not significantly elevated. He had a severe distal RCA stenosis that was treated with DES x 2.  Echo showed EF 55-60%.    In 10/16, he was admitted with acute cholecystitis.  He received a cholecystostomy tube.  Cholecystectomy done in 6/17.   Atrial fibrillation in 1/17, cardioverted to NSR.   Recurrent concerning chest pain in 3/17.  Repeat LHC showed diffuse distal and branch vessel disease consistent with poorly-controlled DM, but patent RCA stents.    He was admitted with atrial fibrillation in 12/17.  Started on sotalol and converted to NSR.    He had recurrent chest pain in 5/18 with abnormal Cardiolite.  Cath was done again in 5/18,  showing diffuse CAD with no good interventional options.   Admitted 11/20 with increased dyspnea and exertional chest pain. Hgb went down to 8.1. GI consulted.  Had [place] that showed severe distal and branch vessel disease but no interventional targets and not good anatomy for CABG. Continued on medical management. Capsule endoscopy without recent or active bleeding, no AVMs. He went into atrial flutter during his hospitalization. He went back into NSR, however, after discharge.      He has had episodes of syncope associated with vigorous coughing.   He was in the ER with atrial flutter and dyspnea in 4/21, back in NSR today on sotalol.    Patient returns for followup of CHF.  His medication compliance has not been great, but currently back to taking all his meds as ordered.  Lasix was increased for 3 days at last appointment for volume overload.  He initially felt better, but getting short of breath again.  He is short of breath walking his dog, walking in the grocery store, carrying heavy loads.  He has occasional episodes of chest heaviness, not associated with exertion. Weight is up 4 lbs.  He is currently wearing a Zio patch, he denies palpitations.   ECG (personally reviewed): NSR, 1st degree AVB, nonspecific ST-T changes.   Labs (5/13): K 3.6, creatinine 0.93, LDL 85, HDL 57 Labs (7/13): K 3.9, creatinine 1.1, LDL 57, HDL 51  Labs (5/14): K 4, creatinine  1.25 Labs (8/14): K 3.7, creatinine 0.9, LDL 103, HDL 42 Labs (8/15): K 3.5 creatinine 1.0, BNP 75 Labs (2/16): K 3.8, creatinine 1.1 Labs (9/16): K 3.5, creatinine 1.03, BNP 59, HDL 40, LDL 111, TGs 333 Labs (11/16): K 3.6, creaitnine 1.07, LFTs normal, LDL 44 Labs (6/17): K 3.4, creatinine 1.52 => 1.13 Labs (1/18): K 3.9, creatinine 1.25 Labs (6/18): K 3.8, creatinine 1.21, HCT 39.7 Labs (8/18): LDL 39, HDL 43 Labs (12/18): 4.4, creatinine 1.25, LFTs normal Labs (8/19): K 3.6, creatinine 1.13, LDL 68, HDL 49  Labs (11/20): K 3.5,  creatinine 1.33, LDL 25 Labs (4/21): K 4.0, SCr 1.14, BNP 266   PMH: 1. DM2 2. Gout 3. HTN: Cough with ACEI.  4. Cardiomyopathy: Suspect mixed ischemic/nonischemic (?ETOH-related).  EF 35% in 2008.  Echo in 5/13 showed EF 60-65% with moderate LVH but EF was 45% on LV-gram in 5/13.  Echo (5/14) with EF 50-55%, mild LVH, inferobasal HK, mild MR.  ETT-Sestamibi in 5/14, however, showed EF 31%.  Cardiac MRI (6/14) with EF 44%, mild global hypokinesis, subepicardial delayed enhancement in a nonspecific RV insertion site pattern.  Echo (2/16) with EF 55-60%, moderate diastolic dysfunction. RHC (9/16) with mean RA 8, PA 39/20 mean 28, mean PCWP 14, CI 2.2. Echo (9/16) with EF 55-60%, grade II diastolic dysfunction.  - Echo (6/17) with EF 55-60%, moderate LVH, grade II diastolic dysfunction, mild AI, mild MR.  - RHC (11/20): mean RA 4, PA 46/14 mean 26, mean PCWP 13, CI 3.23 - Echo (9/20): EF 60-65%, moderate LVH, normal RV size and systolic function, mild-moderate MR, mild AI.  5. CAD: LHC in 2010 with mild nonobstructive disease.  LHC (5/13) with diffuse distal and branch vessel disease, mild global hypokinesis and EF 45%.  ETT-Sestamibi (5/14): EF 31%, small fixed inferior defect with no ischemia.  Lexiscan Cardiolite (9/16) with EF 26%, moderate inferior defect that was partially reversible, suggesting ischemia => High risk study. LHC (9/16) with 40-50% mLAD, diffuse up to 50% distal LAD, 90% ostium of branch off ramus, 95% dRCA => DES to RCA x 2, EF 55-60%.   - LHC (3/17) with diffuse branch and distal vessel disease c/w poorly controlled DM, RCA stents patent.  - LHC (5/18): Diffuse CAD with no good interventional option. 60% mRCA, 90% dLAD, 50% serial mid ramus stenoses, ramus branches with ostial 80-90% stenoses. - LHC (11/20): Severe distal and branch vessel disease but no interventional targets and not good anatomy for CABG  6. H/o chronic HBV 7. Osteochondroma left shoulder.  8.  Hyperlipidemia 9. Paroxysmal atrial fibrillation: Coumadin.  Developed cough and increased ESR with amiodarone.  DCCV in 1/17.  Recurrent atrial fibrillation in 12/17, sotalol started.  10. Atrial flutter: had ablations in 2007 and 2008.  11. OSA on CPAP.  12. Acute cholecystitis (10/16): Cholecystostomy tube placed.  Cholecystectomy 6/17.  13. CKD 14. Anemia - Suspected GI bleed but EGD/c-scope 9/20 without significant findings.  - Capsule Endoscopy (11/20) without recent or active bleeding, no AVMs.  15. Syncope: Suspected cough syncope.   SH: Married, prior smoker.  Some ETOH, occasionally heavy in past.  Does use occasional marijuana. Out of work [employer], [employer]. 2 daughters in grad school.   FH: CAD  ROS: All systems reviewed and negative except as per HPI.   Current Outpatient Medications  Medication Sig Dispense Refill  . ACCU-CHEK SOFTCLIX LANCETS lancets Use as instructed for three times daily testing of blood glucose 100 each 12  . acetaminophen (TYLENOL) 500  MG tablet Take 1,000 mg by mouth every 6 (six) hours as needed (pain).    [name] amLODipine (NORVASC) 5 MG tablet Take 1 tablet by mouth once daily 90 tablet 3  . atorvastatin (LIPITOR) 80 MG tablet Take 1 tablet (80 mg total) by mouth daily. 90 tablet 1  . Blood Glucose Monitoring Suppl (ACCU-CHEK AVIVA PLUS) w/Device KIT Use as directed for 3 times daily testing of blood glucose 1 kit 0  . carvedilol (COREG) 25 MG tablet Take 1 tablet (25 mg total) by mouth 2 (two) times daily with a meal. 180 tablet 1  . ELIQUIS 5 MG TABS tablet Take 1 tablet by mouth twice daily 60 tablet 3  . furosemide (LASIX) 40 MG tablet Take 2 tablets (80 mg total) by mouth every morning AND 1 tablet (40 mg total) every evening. 90 tablet 3  . glucose blood (ACCU-CHEK AVIVA PLUS) test strip Use as instructed for 3 times daily testing of blood glucose 100 each 12  . guaiFENesin (MUCINEX PO) Take 1 tablet by mouth 2 (two) times daily as needed  (cough/congestion).    . hydrALAZINE (APRESOLINE) 50 MG tablet Take 1.5 tablets (75 mg total) by mouth 2 (two) times daily. 90 tablet 3  . insulin glargine (LANTUS SOLOSTAR) 100 UNIT/ML Solostar Pen Inject 35 Units into the skin every morning. And pen needles 1/day 30 mL 3  . isosorbide mononitrate (IMDUR) 120 MG 24 hr tablet Take 1 tablet (120 mg total) by mouth daily. 30 tablet 3  . Lancet Devices (ACCU-CHEK SOFTCLIX) lancets Use as instructed for 3 times daily testing of blood glucose 1 each 0  . metFORMIN (GLUCOPHAGE) 1000 MG tablet Take 1 tablet (1,000 mg total) by mouth 2 (two) times daily with a meal. 180 tablet 1  . nitroGLYCERIN (NITROSTAT) 0.4 MG SL tablet Place 1 tablet (0.4 mg total) under the tongue every 5 (five) minutes as needed for chest pain (x 3 tabs daily). 60 tablet 1  . sotalol (BETAPACE) 120 MG tablet Take 1 tablet (120 mg total) by mouth 2 (two) times daily. 60 tablet 3  . TRUEPLUS LANCETS 28G MISC 1 each by Does not apply route 3 (three) times daily. 100 each 12  . potassium chloride SA (KLOR-CON) 20 MEQ tablet Take 2 tablets (40 mEq total) by mouth daily. 180 tablet 3   No current facility-administered medications for this encounter.    BP 140/70   Pulse 75   Wt 94.8 kg (209 lb)   SpO2 99%   BMI 28.35 kg/m   Wt Readings from Last 3 Encounters:  12/16/19 94.8 kg (209 lb)  12/07/19 93.2 kg (205 lb 6.4 oz)  12/02/19 96.6 kg (213 lb)   PHYSICAL EXAM: General: NAD Neck: JVP 8-9 cm with HJR, no thyromegaly or thyroid nodule.  Lungs: Clear to auscultation bilaterally with normal respiratory effort. CV: Nondisplaced PMI.  Heart regular S1/S2, no S3/S4, no murmur.  No peripheral edema.  No carotid bruit.  Normal pedal pulses.  Abdomen: Soft, nontender, no hepatosplenomegaly, no distention.  Skin: Intact without lesions or rashes.  Neurologic: Alert and oriented x 3.  Psych: Normal affect. Extremities: No clubbing or cyanosis.  HEENT: Normal.    Assessment/Plan:  1. Chronic diastolic CHF: EF 69% on cardiac MRI in 6/14 but EF improved back to 55-60% on 2/16 echo.  However, EF down to 26% on Cardiolite in 9/16.  Echo in 9/20 showed EF 60-65%, moderate LVH, normal RV, mild-moderate MR.  He is volume overloaded  today with 4 lb weight gain since last appointment.  This may have been worsened by recent atrial flutter, but he is back in NSR today.  - Increase Lasix to 80 qam/40 qpm and increase KCl to 40 daily.  - BMET today and again in 10 days.  - I think that a Cardiomems would be helpful to allow [place] to remotely monitor his volume status, we discussed this and he is agreeable to having one placed.  - I am going to arrange for repeat echo given recent worsening of HF symptoms.  2. Atrial fibrillation/flutter: Paroxysmal. He has had atrial flutter ablations x 2 remotely.  He is on sotalol.  QTc ok on ECG today.  He tolerates atrial arrhythmias poorly, seem to worsen CHF.  He was in atypical flutter last month, now back in NSR.  He is currently wearing a Zio patch to try to assess burden of fibrillation/flutter.  - Continue sotalol.  - He continues to wear Zio patch.  - Suspect untreated OSA may be contributing. Will need sleep study.  - Continue Eliquis.  - I am going to refer him to EP for evaluation for repeat ablation (flutter versus fibrillation/flutter).    3. Coronary artery disease: LHC in 9/16 showed diffuse CAD, most significant involving the distal RCA.  Patient had DES x 2 to RCA.  Cath in 3/17 with patent RCA stents and diffuse distal and branch vessel disease concerning for poorly controlled diabetes.  Cath in 5/18 again showed diffuse CAD without good interventional target.  Cath in 11/20 showed severe distal and branch vessel disease but no interventional targets and not good anatomy for CABG.  He has had atypical chest pain.  - No ASA given Eliquis use.  - Continue atorvastatin 80 mg daily, good lipids 06/2019  - Continue  Coreg, amlodipine, and Imdur 120 mg daily for angina control.  - Avoid ranolazine with sotalol use.  4. Hyperlipidemia:  On atorvastatin, good lipids 06/2019  5. Hypertension: Controlled on current regimen.   6. OSA: not on CPAP. Did not tolerate previously. We discussed CV affects of untreated OSA including potential for worsening HF and recurrent atrial arrhthymias. He is willing to retry CPAP. Will order home sleep study for repeat assessment.  7. CKD: BMET today and in 1 week with Lasix increase.  8. Syncope: Most likely cough syncope, but wearing 2 wk Zio patch.   Followup 3 wks with NP/PA to reassess volume.  Will work on [employer].   Loralie Champagne 12/16/2019

## 2019-12-21 ENCOUNTER — Encounter: Payer: Self-pay | Admitting: General Practice

## 2019-12-28 ENCOUNTER — Other Ambulatory Visit: Payer: Self-pay

## 2019-12-28 ENCOUNTER — Ambulatory Visit (HOSPITAL_COMMUNITY)
Admission: RE | Admit: 2019-12-28 | Discharge: 2019-12-28 | Disposition: A | Discharge status: Home / Self Care | Payer: Medicare Other | Source: Ambulatory Visit | Attending: Internal Medicine | Admitting: Internal Medicine

## 2019-12-28 ENCOUNTER — Ambulatory Visit (HOSPITAL_BASED_OUTPATIENT_CLINIC_OR_DEPARTMENT_OTHER)
Admission: RE | Admit: 2019-12-28 | Discharge: 2019-12-28 | Disposition: A | Discharge status: Home / Self Care | Payer: Medicare Other | Source: Ambulatory Visit | Attending: Family Medicine | Admitting: Family Medicine

## 2019-12-28 ENCOUNTER — Telehealth (HOSPITAL_COMMUNITY): Payer: Self-pay | Admitting: *Deleted

## 2019-12-28 DIAGNOSIS — E119 Type 2 diabetes mellitus without complications: Secondary | ICD-10-CM | POA: Insufficient documentation

## 2019-12-28 DIAGNOSIS — I358 Other nonrheumatic aortic valve disorders: Secondary | ICD-10-CM | POA: Insufficient documentation

## 2019-12-28 DIAGNOSIS — I2510 Atherosclerotic heart disease of native coronary artery without angina pectoris: Secondary | ICD-10-CM | POA: Diagnosis not present

## 2019-12-28 DIAGNOSIS — G4730 Sleep apnea, unspecified: Secondary | ICD-10-CM | POA: Insufficient documentation

## 2019-12-28 DIAGNOSIS — I5032 Chronic diastolic (congestive) heart failure: Secondary | ICD-10-CM

## 2019-12-28 DIAGNOSIS — I110 Hypertensive heart disease with heart failure: Secondary | ICD-10-CM | POA: Insufficient documentation

## 2019-12-28 DIAGNOSIS — E785 Hyperlipidemia, unspecified: Secondary | ICD-10-CM | POA: Insufficient documentation

## 2019-12-28 DIAGNOSIS — I4891 Unspecified atrial fibrillation: Secondary | ICD-10-CM | POA: Diagnosis not present

## 2019-12-28 LAB — BASIC METABOLIC PANEL
Anion gap: 10 (ref 5–15)
BUN: 12 mg/dL (ref 8–23)
CO2: 24 mmol/L (ref 22–32)
Calcium: 9.3 mg/dL (ref 8.9–10.3)
Chloride: 104 mmol/L (ref 98–111)
Creatinine, Ser: 1.41 mg/dL — ABNORMAL HIGH (ref 0.61–1.24)
GFR calc Af Amer: >60 mL/min (ref >60)
GFR calc non Af Amer: 52 mL/min — ABNORMAL LOW (ref >60)
Glucose, Bld: 220 mg/dL — ABNORMAL HIGH (ref 70–99)
Potassium: 4.1 mmol/L (ref 3.5–5.1)
Sodium: 138 mmol/L (ref 135–145)

## 2019-12-28 NOTE — Progress Notes (Signed)
Echocardiogram 2D Echocardiogram has been performed.  Oneal Deputy Russel Morain 12/28/2019, 11:26 AM

## 2019-12-28 NOTE — Telephone Encounter (Signed)
pts daughter left vm requesting samples of eliquis for pt. Samples left at front des for pick up. Daughter aware.

## 2019-12-31 ENCOUNTER — Ambulatory Visit (HOSPITAL_COMMUNITY)
Admission: RE | Admit: 2019-12-31 | Discharge: 2019-12-31 | Disposition: A | Discharge status: Home / Self Care | Payer: Medicare Other | Source: Ambulatory Visit | Attending: Nurse Practitioner | Admitting: Nurse Practitioner

## 2019-12-31 ENCOUNTER — Other Ambulatory Visit: Payer: Self-pay

## 2019-12-31 ENCOUNTER — Encounter (HOSPITAL_COMMUNITY): Payer: Self-pay | Admitting: Nurse Practitioner

## 2019-12-31 VITALS — BP 132/68 | HR 77 | Ht 72.0 in | Wt 211.0 lb

## 2019-12-31 DIAGNOSIS — Z85831 Personal history of malignant neoplasm of soft tissue: Secondary | ICD-10-CM | POA: Insufficient documentation

## 2019-12-31 DIAGNOSIS — I110 Hypertensive heart disease with heart failure: Secondary | ICD-10-CM | POA: Insufficient documentation

## 2019-12-31 DIAGNOSIS — I4891 Unspecified atrial fibrillation: Secondary | ICD-10-CM | POA: Diagnosis present

## 2019-12-31 DIAGNOSIS — I4892 Unspecified atrial flutter: Secondary | ICD-10-CM | POA: Diagnosis not present

## 2019-12-31 DIAGNOSIS — I5032 Chronic diastolic (congestive) heart failure: Secondary | ICD-10-CM | POA: Diagnosis not present

## 2019-12-31 DIAGNOSIS — I480 Paroxysmal atrial fibrillation: Secondary | ICD-10-CM | POA: Diagnosis not present

## 2019-12-31 DIAGNOSIS — E119 Type 2 diabetes mellitus without complications: Secondary | ICD-10-CM | POA: Diagnosis not present

## 2019-12-31 DIAGNOSIS — Z91013 Allergy to seafood: Secondary | ICD-10-CM | POA: Insufficient documentation

## 2019-12-31 DIAGNOSIS — Z87891 Personal history of nicotine dependence: Secondary | ICD-10-CM | POA: Diagnosis not present

## 2019-12-31 DIAGNOSIS — G4730 Sleep apnea, unspecified: Secondary | ICD-10-CM | POA: Insufficient documentation

## 2019-12-31 DIAGNOSIS — Z7901 Long term (current) use of anticoagulants: Secondary | ICD-10-CM | POA: Diagnosis not present

## 2019-12-31 DIAGNOSIS — Z79899 Other long term (current) drug therapy: Secondary | ICD-10-CM | POA: Diagnosis not present

## 2019-12-31 DIAGNOSIS — E7800 Pure hypercholesterolemia, unspecified: Secondary | ICD-10-CM | POA: Insufficient documentation

## 2019-12-31 DIAGNOSIS — Z794 Long term (current) use of insulin: Secondary | ICD-10-CM | POA: Diagnosis not present

## 2019-12-31 DIAGNOSIS — I2510 Atherosclerotic heart disease of native coronary artery without angina pectoris: Secondary | ICD-10-CM | POA: Diagnosis not present

## 2019-12-31 DIAGNOSIS — Z833 Family history of diabetes mellitus: Secondary | ICD-10-CM | POA: Insufficient documentation

## 2019-12-31 DIAGNOSIS — D6869 Other thrombophilia: Secondary | ICD-10-CM

## 2019-12-31 DIAGNOSIS — Z888 Allergy status to other drugs, medicaments and biological substances status: Secondary | ICD-10-CM | POA: Insufficient documentation

## 2019-12-31 DIAGNOSIS — Z8249 Family history of ischemic heart disease and other diseases of the circulatory system: Secondary | ICD-10-CM | POA: Diagnosis not present

## 2019-12-31 NOTE — Progress Notes (Addendum)
Primary Care Physician: [name], MD Referring Physician: Dr. Delorise Jackson Callan Norden is a 64 y.o. male with a h/o atrial flutter s/p ablation x 2 in 2007/2008. He has h/o afib as well. He was seen in the ER and had atrial flutter. He had f/u with Dr. Aundra Dubin  5/5 and monitor was placed. He was in [place] that visit and in [place] today.He was sent to afib clinic to discuss possible ablation. Monitor results are not available for this visit. He continues on sotalol and has failed amiodarone in the past. Dr. Aundra Dubin felt that pt tolerated arrhythmia's poorly and seems to worsen HF.  Today, he denies symptoms of palpitations, chest pain, shortness of breath, orthopnea, PND, lower extremity edema, dizziness, presyncope, syncope, or neurologic sequela. The patient is tolerating medications without difficulties and is otherwise without complaint today.   Past Medical History:  Diagnosis Date  . Anginal pain ([place])   . Anxiety   . Arrhythmia   . CAD (coronary artery disease), native coronary artery    a. Nonobstructive CAD by cath 2013 - diffuse distal and branch vessel CAD, no severe disease in the major coronaries, LV mild global hypokinesis, EF 45%. b. ETT-Sestamibi 5/14: EF 31%, small fixed inferior defect with no ischemia.  . Cholecystitis   . Chronic CHF ([place])    a. Mixed ICM/NICM (?EtOH). EF 35% in 2008. Echo 5/13: EF 60-65%, mod LVH, EF 45% on V gram in 12/2011. EF 12/2012: EF 50-55%, mild LVH, inferobasal HK, mild MR. ETT-Ses 5/14 EF 41%. Cardiac MRI 5/14: EF 44%, mild global HK, subepicardial delayed enhancement in nonspecific RV insertion pattern.  . COLONIC POLYPS, HX OF 12/30/2006  . Gout   . H/O atrial flutter 2007   a. Ablations in 2007, 2008.  [name] Heart murmur   . HEPATITIS B, CHRONIC 12/30/2006  . History of alcohol abuse   . History of hiatal hernia   . History of medication noncompliance   . History of nuclear stress test    Myoview 3/17: + chest pain; EF 33%, downsloping  ST depression 2, V4-6, LVH with strain, no ischemia on images; intermediate risk   . HYPERCHOLESTEROLEMIA 07/11/2010  . Hypertension   . Left sciatic nerve pain since 04/2015  . LIVER FUNCTION TESTS, ABNORMAL, HX OF 12/30/2006  . MITRAL REGURGITATION 12/30/2006  . 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 PAF (paroxysmal atrial fibrillation) ([facility])    On coumadin  . Rectal bleeding 12/18/2011   Scheduled for colonoscopy.    . Shortness of breath dyspnea    with excertion  . Sleep apnea    "suppose to send mask but they never did" (05/03/2015)  . Type II diabetes mellitus ([place])    Past Surgical History:  Procedure Laterality Date  . A FLUTTER ABLATION  2007, 2008   catheter ablation   . CARDIAC CATHETERIZATION N/A 05/03/2015   Procedure: Right/Left Heart Cath and Coronary Angiography;  Surgeon: Larey Dresser, MD;  Location: [place] [facility];  Service: Cardiovascular;  Laterality: N/A;  . CARDIAC CATHETERIZATION N/A 05/03/2015   Procedure: Coronary Stent Intervention;  Surgeon: Jettie Booze, MD;  Location: [place] [facility];  Service: Cardiovascular;  Laterality: N/A;  . CARDIAC CATHETERIZATION N/A 11/04/2015   Procedure: Left Heart Cath and Coronary Angiography;  Surgeon: Larey Dresser, MD;  Location: [name] [facility];  Service: Cardiovascular;  Laterality: N/A;  . CARDIOVERSION N/A 08/24/2015   Procedure: CARDIOVERSION;  Surgeon:  Thayer Headings, MD;  Location: [facility] ENDOSCOPY;  Service: Cardiovascular;  Laterality: N/A;  . CHOLECYSTECTOMY N/A 02/07/2016   Procedure: LAPAROSCOPIC CHOLECYSTECTOMY WITH  INTRAOPERATIVE CHOLANGIOGRAM;  Surgeon: Greer Pickerel, MD;  Location: [facility];  Service: General;  Laterality: N/A;  . COLONOSCOPY WITH PROPOFOL N/A 04/28/2019   Procedure: COLONOSCOPY WITH PROPOFOL;  Surgeon: Ronnette Juniper, MD;  Location: [place];  Service: Gastroenterology;  Laterality: N/A;  . CORONARY ANGIOPLASTY  12/05/01  . ESOPHAGOGASTRODUODENOSCOPY (EGD) WITH PROPOFOL  N/A 04/27/2019   Procedure: ESOPHAGOGASTRODUODENOSCOPY (EGD) WITH PROPOFOL;  Surgeon: Ronnette Juniper, MD;  Location: [place];  Service: Gastroenterology;  Laterality: N/A;  . GIVENS CAPSULE STUDY N/A 06/29/2019   Procedure: GIVENS CAPSULE STUDY;  Surgeon: Wilford Corner, MD;  Location: [place];  Service: Endoscopy;  Laterality: N/A;  . LEFT HEART CATH AND CORONARY ANGIOGRAPHY N/A 12/28/2016   Procedure: Left Heart Cath and Coronary Angiography;  Surgeon: Larey Dresser, MD;  Location: [place] [facility];  Service: Cardiovascular;  Laterality: N/A;  . LEFT HEART CATHETERIZATION WITH CORONARY ANGIOGRAM N/A 12/19/2011   Procedure: LEFT HEART CATHETERIZATION WITH CORONARY ANGIOGRAM;  Surgeon: Larey Dresser, MD;  Location: [facility] CATH LAB;  Service: Cardiovascular;  Laterality: N/A;  . OSTEOCHONDROMA EXCISION Left 1972   "took bone tumor off my shoulder"  . POLYPECTOMY  04/28/2019   Procedure: POLYPECTOMY;  Surgeon: Ronnette Juniper, MD;  Location: [facility] ENDOSCOPY;  Service: Gastroenterology;;  . RIGHT/LEFT HEART CATH AND CORONARY ANGIOGRAPHY N/A 07/01/2019   Procedure: RIGHT/LEFT HEART CATH AND CORONARY ANGIOGRAPHY;  Surgeon: Larey Dresser, MD;  Location: [place] [facility] LAB;  Service: Cardiovascular;  Laterality: N/A;    Current Outpatient Medications  Medication Sig Dispense Refill  . ACCU-CHEK SOFTCLIX LANCETS lancets Use as instructed for three times daily testing of blood glucose 100 each 12  . acetaminophen (TYLENOL) 500 MG tablet Take 1,000 mg by mouth every 6 (six) hours as needed (pain).    [name] amLODipine (NORVASC) 5 MG tablet Take 1 tablet by mouth once daily 90 tablet 3  . atorvastatin (LIPITOR) 80 MG tablet Take 1 tablet (80 mg total) by mouth daily. 90 tablet 1  . Blood Glucose Monitoring Suppl (ACCU-CHEK AVIVA PLUS) w/Device KIT Use as directed for 3 times daily testing of blood glucose 1 kit 0  . carvedilol (COREG) 25 MG tablet Take 1 tablet (25 mg total) by mouth 2 (two) times daily  with a meal. 180 tablet 1  . ELIQUIS 5 MG TABS tablet Take 1 tablet by mouth twice daily 60 tablet 3  . furosemide (LASIX) 40 MG tablet Take 2 tablets (80 mg total) by mouth every morning AND 1 tablet (40 mg total) every evening. 90 tablet 3  . glucose blood (ACCU-CHEK AVIVA PLUS) test strip Use as instructed for 3 times daily testing of blood glucose 100 each 12  . guaiFENesin (MUCINEX PO) Take 1 tablet by mouth 2 (two) times daily as needed (cough/congestion).    . hydrALAZINE (APRESOLINE) 50 MG tablet Take 1.5 tablets (75 mg total) by mouth 2 (two) times daily. 90 tablet 3  . insulin glargine (LANTUS SOLOSTAR) 100 UNIT/ML Solostar Pen Inject 35 Units into the skin every morning. And pen needles 1/day 30 mL 3  . isosorbide mononitrate (IMDUR) 120 MG 24 hr tablet Take 1 tablet (120 mg total) by mouth daily. 30 tablet 3  . Lancet Devices (ACCU-CHEK SOFTCLIX) lancets Use as instructed for 3 times daily testing of blood glucose 1 each 0  . metFORMIN (  GLUCOPHAGE) 1000 MG tablet Take 1 tablet (1,000 mg total) by mouth 2 (two) times daily with a meal. 180 tablet 1  . nitroGLYCERIN (NITROSTAT) 0.4 MG SL tablet Place 1 tablet (0.4 mg total) under the tongue every 5 (five) minutes as needed for chest pain (x 3 tabs daily). 60 tablet 1  . potassium chloride SA (KLOR-CON) 20 MEQ tablet Take 2 tablets (40 mEq total) by mouth daily. 180 tablet 3  . sotalol (BETAPACE) 120 MG tablet Take 1 tablet (120 mg total) by mouth 2 (two) times daily. 60 tablet 3  . TRUEPLUS LANCETS 28G MISC 1 each by Does not apply route 3 (three) times daily. 100 each 12   No current facility-administered medications for this encounter.    Allergies  Allergen Reactions  . Shrimp [Shellfish Allergy] Nausea And Vomiting  . Ace Inhibitors Cough  . Other Hives and Other (See Comments)    Patient reports developing hives after receiving "some antibiotic given in 1980''s at [facility] He does not know which antibiotic.     Social History   Socioeconomic History  . Marital status: Widowed    Spouse name: Not on file  . Number of children: 2  . Years of education: 43  . Highest education level: Some college, no degree  Occupational History    Employer: UNEMPLOYED  Tobacco Use  . Smoking status: Former Smoker    Packs/day: 4.00    Years: 18.00    Pack years: 72.00    Types: Cigarettes    Quit date: 08/14/1983    Years since quitting: 36.4  . Smokeless tobacco: Never Used  Substance and Sexual Activity  . Alcohol use: Yes    Alcohol/week: 16.0 standard drinks    Types: 6 Cans of beer, 10 Shots of liquor per week    Comment: occasionally  . Drug use: Yes    Types: Marijuana    Comment: twice a week  . Sexual activity: Never  Other Topics Concern  . Not on file  Social History Narrative  . Not on file   Social Determinants of Health   Financial Resource Strain:   . Difficulty of Paying Living Expenses:   Food Insecurity:   . Worried About Charity fundraiser in the Last Year:   . Arboriculturist in the Last Year:   Transportation Needs:   . Film/video editor (Medical):   [name] Lack of Transportation (Non-Medical):   Physical Activity:   . Days of Exercise per Week:   . Minutes of Exercise per Session:   Stress:   . Feeling of Stress :   Social Connections:   . Frequency of Communication with Friends and Family:   . Frequency of Social Gatherings with Friends and Family:   . Attends Religious Services:   . Active Member of Clubs or Organizations:   . Attends Archivist Meetings:   [name] Marital Status:   Intimate Partner Violence:   . Fear of Current or Ex-Partner:   . Emotionally Abused:   [name] Physically Abused:   . Sexually Abused:     Family History  Problem Relation Age of Onset  . Diabetes Mother   . Hypertension Mother   . Heart attack Neg Hx   . Stroke Neg Hx     ROS- All systems are reviewed and negative except as per the HPI above  Physical  Exam: Vitals:   12/31/19 1038  BP: 132/68  Pulse: 77  Weight:  95.7 kg  Height: 6' (1.829 m)   Wt Readings from Last 3 Encounters:  12/31/19 95.7 kg  12/16/19 94.8 kg  12/07/19 93.2 kg    Labs: Lab Results  Component Value Date   NA 138 12/28/2019   K 4.1 12/28/2019   CL 104 12/28/2019   CO2 24 12/28/2019   GLUCOSE 220 (H) 12/28/2019   BUN 12 12/28/2019   CREATININE 1.41 (H) 12/28/2019   CALCIUM 9.3 12/28/2019   PHOS 2.6 07/31/2013   MG 2.2 07/02/2019   Lab Results  Component Value Date   INR 1.2 04/24/2019   Lab Results  Component Value Date   CHOL 81 06/30/2019   HDL 31 (L) 06/30/2019   LDLCALC 25 06/30/2019   TRIG 127 06/30/2019     GEN- The patient is well appearing, alert and oriented x 3 today.   Head- normocephalic, atraumatic Eyes-  Sclera clear, conjunctiva pink Ears- hearing intact Oropharynx- clear Neck- supple, no JVP Lymph- no cervical lymphadenopathy Lungs- Clear to ausculation bilaterally, normal work of breathing Heart- Regular rate and rhythm, no murmurs, rubs or gallops, PMI not laterally displaced GI- soft, NT, ND, + BS Extremities- no clubbing, cyanosis, or edema MS- no significant deformity or atrophy Skin- no rash or lesion Psych- euthymic mood, full affect Neuro- strength and sensation are intact  EKG-SR at 77 bpm, pr int 214 ms, qrs int 90 ms, qtc 477 ms  Echo-1. Abnormal GLS -11.6 but appears to be tracking epicardium . Left ventricular ejection fraction, by estimation, is 55%. The left ventricle has normal function. The left ventricle has no regional wall motion abnormalities. Left ventricular diastolic parameters were normal. 2. Right ventricular systolic function is normal. The right ventricular size is normal. 3. Left atrial size was mildly dilated. 4. The mitral valve is normal in structure. Trivial mitral valve regurgitation. No evidence of mitral stenosis. 5. The aortic valve is tricuspid. Aortic valve regurgitation is  trivial. Mild aortic valve sclerosis is present, with no evidence of aortic valve stenosis. 6. The inferior vena cava is normal in size with greater than 50% respiratory variability, suggesting right atrial pressure of 3 mmHg  Assessment and Plan: 1.aflutter/afib  Has had a few recent breakthroughs, most recently atypical atrial flutter He wore a recent monitor Results are not back for review to determine afib/flutter burden Will try to review this when available and then discuss with Dr. Rayann Heman possible ablation  Currently  in SR Continue  sotalol 120 mg bid   [id number] score of 5 Continue  eliquis 5 mg bid    3. Chronic diastolic failure Per Dr. Aundra Dubin  Normovolemic today   I will get back with pt when monitor results are reviewed re referral to Dr. Rayann Heman for ablation   Addendum: 6/3- monitor results are reviewed. Showed predominant SR with 20% a flutter burden, will ask for appointment with Dr. Rayann Heman to further discuss  Geroge Baseman. Anup Brigham, [facility] [address] [place], [place] [address] [phone]

## 2020-01-04 ENCOUNTER — Telehealth (HOSPITAL_COMMUNITY): Payer: Self-pay

## 2020-01-04 NOTE — Telephone Encounter (Signed)
LM for patient for results

## 2020-01-05 ENCOUNTER — Other Ambulatory Visit (HOSPITAL_COMMUNITY): Payer: Self-pay | Admitting: Internal Medicine

## 2020-01-05 DIAGNOSIS — I4819 Other persistent atrial fibrillation: Secondary | ICD-10-CM

## 2020-01-07 ENCOUNTER — Encounter (HOSPITAL_COMMUNITY): Payer: Self-pay

## 2020-01-07 ENCOUNTER — Other Ambulatory Visit: Payer: Self-pay

## 2020-01-07 ENCOUNTER — Ambulatory Visit (HOSPITAL_COMMUNITY)
Admission: RE | Admit: 2020-01-07 | Discharge: 2020-01-07 | Disposition: A | Discharge status: Home / Self Care | Payer: Medicare Other | Source: Ambulatory Visit | Attending: Cardiology | Admitting: Cardiology

## 2020-01-07 VITALS — BP 142/76 | HR 75 | Wt 208.0 lb

## 2020-01-07 DIAGNOSIS — M109 Gout, unspecified: Secondary | ICD-10-CM | POA: Diagnosis not present

## 2020-01-07 DIAGNOSIS — N189 Chronic kidney disease, unspecified: Secondary | ICD-10-CM | POA: Diagnosis not present

## 2020-01-07 DIAGNOSIS — I480 Paroxysmal atrial fibrillation: Secondary | ICD-10-CM | POA: Insufficient documentation

## 2020-01-07 DIAGNOSIS — Z955 Presence of coronary angioplasty implant and graft: Secondary | ICD-10-CM | POA: Diagnosis not present

## 2020-01-07 DIAGNOSIS — E1122 Type 2 diabetes mellitus with diabetic chronic kidney disease: Secondary | ICD-10-CM | POA: Diagnosis not present

## 2020-01-07 DIAGNOSIS — E785 Hyperlipidemia, unspecified: Secondary | ICD-10-CM | POA: Diagnosis not present

## 2020-01-07 DIAGNOSIS — Z7901 Long term (current) use of anticoagulants: Secondary | ICD-10-CM | POA: Insufficient documentation

## 2020-01-07 DIAGNOSIS — I4892 Unspecified atrial flutter: Secondary | ICD-10-CM | POA: Diagnosis not present

## 2020-01-07 DIAGNOSIS — R55 Syncope and collapse: Secondary | ICD-10-CM | POA: Insufficient documentation

## 2020-01-07 DIAGNOSIS — Z79899 Other long term (current) drug therapy: Secondary | ICD-10-CM | POA: Diagnosis not present

## 2020-01-07 DIAGNOSIS — I5032 Chronic diastolic (congestive) heart failure: Secondary | ICD-10-CM | POA: Diagnosis not present

## 2020-01-07 DIAGNOSIS — Z87891 Personal history of nicotine dependence: Secondary | ICD-10-CM | POA: Insufficient documentation

## 2020-01-07 DIAGNOSIS — Z794 Long term (current) use of insulin: Secondary | ICD-10-CM | POA: Diagnosis not present

## 2020-01-07 DIAGNOSIS — Z8249 Family history of ischemic heart disease and other diseases of the circulatory system: Secondary | ICD-10-CM | POA: Diagnosis not present

## 2020-01-07 DIAGNOSIS — I2510 Atherosclerotic heart disease of native coronary artery without angina pectoris: Secondary | ICD-10-CM | POA: Diagnosis not present

## 2020-01-07 DIAGNOSIS — I130 Hypertensive heart and chronic kidney disease with heart failure and stage 1 through stage 4 chronic kidney disease, or unspecified chronic kidney disease: Secondary | ICD-10-CM | POA: Insufficient documentation

## 2020-01-07 LAB — BASIC METABOLIC PANEL
Anion gap: 11 (ref 5–15)
BUN: 13 mg/dL (ref 8–23)
CO2: 22 mmol/L (ref 22–32)
Calcium: 9.2 mg/dL (ref 8.9–10.3)
Chloride: 102 mmol/L (ref 98–111)
Creatinine, Ser: 1.39 mg/dL — ABNORMAL HIGH (ref 0.61–1.24)
GFR calc Af Amer: >60 mL/min (ref >60)
GFR calc non Af Amer: 53 mL/min — ABNORMAL LOW (ref >60)
Glucose, Bld: 352 mg/dL — ABNORMAL HIGH (ref 70–99)
Potassium: 3.6 mmol/L (ref 3.5–5.1)
Sodium: 135 mmol/L (ref 135–145)

## 2020-01-07 LAB — CBC
HCT: 39.2 % (ref 39.0–52.0)
Hemoglobin: 11.4 g/dL — ABNORMAL LOW (ref 13.0–17.0)
MCH: 20.5 pg — ABNORMAL LOW (ref 26.0–34.0)
MCHC: 29.1 g/dL — ABNORMAL LOW (ref 30.0–36.0)
MCV: 70.5 fL — ABNORMAL LOW (ref 80.0–100.0)
Platelets: 243 10*3/uL (ref 150–400)
RBC: 5.56 MIL/uL (ref 4.22–5.81)
RDW: 19.9 % — ABNORMAL HIGH (ref 11.5–15.5)
WBC: 6.8 10*3/uL (ref 4.0–10.5)
nRBC: 0 % (ref 0.0–0.2)

## 2020-01-07 MED ORDER — FUROSEMIDE 40 MG PO TABS: ORAL_TABLET | ORAL | 6 refills | Status: DC

## 2020-01-07 MED ORDER — POTASSIUM CHLORIDE CRYS ER 20 MEQ PO TBCR: 60.0000 meq | EXTENDED_RELEASE_TABLET | Freq: Every day | ORAL | 6 refills | Status: DC

## 2020-01-07 NOTE — Progress Notes (Signed)
ReDS Vest / Clip - 01/07/20 1600      ReDS Vest / Clip   Station Marker  D    Ruler Value  32    ReDS Value Range  Moderate volume overload    ReDS Actual Value  40

## 2020-01-07 NOTE — H&P (View-Only) (Signed)
Patient ID: [name] [name], male   DOB: 1956/02/21, 64 y.o.   MRN: [id number] PCP: [name], MD Cardiology: Dr. Aundra Dubin  64 y.o. with history of HTN, diabetes, paroxysmal atrial fibrillation, and CAD presents for cardiology followup.  He was admitted in 5/13 with chest pain with exertion.  LHC was done showing global HK with EF 45% and diffuse, severe distal and branch vessel disease.  There was no interventional option, but it was suspect that this disease could be causing his anginal-type pain.  Echo at that time was read as showing EF 60-65%.  He was admitted in 5/14 with hypertensive urgency and chest pain.  He ruled out for MI and BP was controlled.  His EF was 50-55% by echo.  ETT-Sestamibi done as outpatient showed small fixed inferior defect with no ischemia but EF was 31%.  He was quite hypertensive during the study.  Cardiac MRI done to confirm EF in 5/14 showed EF 44%.  He had an episode of atrial fibrillation/RVR in 3/15 but went back into NSR.  Echo in 2/16 showed EF 55-60% with moderate diastolic dysfunction.   He had a Cardiolite in 9/16 showing inferior ischemia.  He was having exertional dyspnea with less activity than normal.  There was concern for worsened CAD and he was sent for [facility] in 9/16. By RHC, filling pressures were not significantly elevated. He had a severe distal RCA stenosis that was treated with DES x 2.  Echo showed EF 55-60%.    In 10/16, he was admitted with acute cholecystitis.  He received a cholecystostomy tube.  Cholecystectomy done in 6/17.   Atrial fibrillation in 1/17, cardioverted to NSR.   Recurrent concerning chest pain in 3/17.  Repeat LHC showed diffuse distal and branch vessel disease consistent with poorly-controlled DM, but patent RCA stents.    He was admitted with atrial fibrillation in 12/17.  Started on sotalol and converted to NSR.    He had recurrent chest pain in 5/18 with abnormal Cardiolite.  Cath was done again in 5/18,  showing diffuse CAD with no good interventional options.   Admitted 11/20 with increased dyspnea and exertional chest pain. Hgb went down to 8.1. GI consulted.  Had [place] that showed severe distal and branch vessel disease but no interventional targets and not good anatomy for CABG. Continued on medical management. Capsule endoscopy without recent or active bleeding, no AVMs. He went into atrial flutter during his hospitalization. He went back into NSR, however, after discharge.      He has had episodes of syncope associated with vigorous coughing.   He was in the ER with atrial flutter and dyspnea in 4/21, back in NSR today on sotalol.  Zio patch was placed to assess AFib/AFL burden. Report reviewed today, Atrial Flutter occurred (20% burden), rates ranging from 36-135 bpm (average HR 67). Occasional PVC and couplets and ventricular trigeminy. No sustained VT. 2nd degree AVB was captured w/ rate 33 bpm occurring early morning hours at 1:24 am (? OSA). No pauses > 3 sec detected.  At last visit w/ Dr Aundra Dubin 5/5, he was volume overloaded and SOB w/ exertion. Lasix was increased to 80 qam/40 qpm and  KCl increased to 40 daily. He was recommended for cardiomems. 2D echo also repeated and showed normal LVEF 55%. RV normal. Repeat sleep study also ordered and pending.    Today in f/u, he notes slight improvement w/ diuretic adjustment but remains SOB w/ physical activities. Gets SOB walking his dog  short distances. Denies resting dyspnea. Reports full med compliance. Tries to watch salt intake. Denies CP. No syncope/ near syncope.     ECG (personally reviewed): NSR, 1st degree AVB, nonspecific ST-T changes. Occasional PVCs, 72 bpm, QT/QTc 430/470 ms  Labs (5/13): K 3.6, creatinine 0.93, LDL 85, HDL 57 Labs (7/13): K 3.9, creatinine 1.1, LDL 57, HDL 51  Labs (5/14): K 4, creatinine 1.25 Labs (8/14): K 3.7, creatinine 0.9, LDL 103, HDL 42 Labs (8/15): K 3.5 creatinine 1.0, BNP 75 Labs (2/16): K 3.8,  creatinine 1.1 Labs (9/16): K 3.5, creatinine 1.03, BNP 59, HDL 40, LDL 111, TGs 333 Labs (11/16): K 3.6, creaitnine 1.07, LFTs normal, LDL 44 Labs (6/17): K 3.4, creatinine 1.52 => 1.13 Labs (1/18): K 3.9, creatinine 1.25 Labs (6/18): K 3.8, creatinine 1.21, HCT 39.7 Labs (8/18): LDL 39, HDL 43 Labs (12/18): 4.4, creatinine 1.25, LFTs normal Labs (8/19): K 3.6, creatinine 1.13, LDL 68, HDL 49  Labs (11/20): K 3.5, creatinine 1.33, LDL 25 Labs (4/21): K 4.0, SCr 1.14, BNP 266  Labs (5/17): K 4.1, creatinine 1.41   PMH: 1. DM2 2. Gout 3. HTN: Cough with ACEI.  4. Cardiomyopathy: Suspect mixed ischemic/nonischemic (?ETOH-related).  EF 35% in 2008.  Echo in 5/13 showed EF 60-65% with moderate LVH but EF was 45% on LV-gram in 5/13.  Echo (5/14) with EF 50-55%, mild LVH, inferobasal HK, mild MR.  ETT-Sestamibi in 5/14, however, showed EF 31%.  Cardiac MRI (6/14) with EF 44%, mild global hypokinesis, subepicardial delayed enhancement in a nonspecific RV insertion site pattern.  Echo (2/16) with EF 55-60%, moderate diastolic dysfunction. RHC (9/16) with mean RA 8, PA 39/20 mean 28, mean PCWP 14, CI 2.2. Echo (9/16) with EF 55-60%, grade II diastolic dysfunction.  - Echo (6/17) with EF 55-60%, moderate LVH, grade II diastolic dysfunction, mild AI, mild MR.  - RHC (11/20): mean RA 4, PA 46/14 mean 26, mean PCWP 13, CI 3.23 - Echo (9/20): EF 60-65%, moderate LVH, normal RV size and systolic function, mild-moderate MR, mild AI.  5. CAD: LHC in 2010 with mild nonobstructive disease.  LHC (5/13) with diffuse distal and branch vessel disease, mild global hypokinesis and EF 45%.  ETT-Sestamibi (5/14): EF 31%, small fixed inferior defect with no ischemia.  Lexiscan Cardiolite (9/16) with EF 26%, moderate inferior defect that was partially reversible, suggesting ischemia => High risk study. LHC (9/16) with 40-50% mLAD, diffuse up to 50% distal LAD, 90% ostium of branch off ramus, 95% dRCA => DES to RCA x 2, EF  55-60%.   - LHC (3/17) with diffuse branch and distal vessel disease c/w poorly controlled DM, RCA stents patent.  - LHC (5/18): Diffuse CAD with no good interventional option. 60% mRCA, 90% dLAD, 50% serial mid ramus stenoses, ramus branches with ostial 80-90% stenoses. - LHC (11/20): Severe distal and branch vessel disease but no interventional targets and not good anatomy for CABG  6. H/o chronic HBV 7. Osteochondroma left shoulder.  8. Hyperlipidemia 9. Paroxysmal atrial fibrillation: Coumadin.  Developed cough and increased ESR with amiodarone.  DCCV in 1/17.  Recurrent atrial fibrillation in 12/17, sotalol started.  10. Atrial flutter: had ablations in 2007 and 2008.  11. OSA on CPAP.  12. Acute cholecystitis (10/16): Cholecystostomy tube placed.  Cholecystectomy 6/17.  13. CKD 14. Anemia - Suspected GI bleed but EGD/c-scope 9/20 without significant findings.  - Capsule Endoscopy (11/20) without recent or active bleeding, no AVMs.  15. Syncope: Suspected cough syncope.  SH: Married, prior smoker.  Some ETOH, occasionally heavy in past.  Does use occasional marijuana. Out of work Engineer, drilling. 2 daughters in grad school.   FH: CAD  ROS: All systems reviewed and negative except as per HPI.   Current Outpatient Medications  Medication Sig Dispense Refill  . ACCU-CHEK SOFTCLIX LANCETS lancets Use as instructed for three times daily testing of blood glucose 100 each 12  . acetaminophen (TYLENOL) 500 MG tablet Take 1,000 mg by mouth every 6 (six) hours as needed (pain).    [name] amLODipine (NORVASC) 5 MG tablet Take 1 tablet by mouth once daily 90 tablet 3  . atorvastatin (LIPITOR) 80 MG tablet Take 1 tablet (80 mg total) by mouth daily. 90 tablet 1  . Blood Glucose Monitoring Suppl (ACCU-CHEK AVIVA PLUS) w/Device KIT Use as directed for 3 times daily testing of blood glucose 1 kit 0  . carvedilol (COREG) 25 MG tablet Take 1 tablet (25 mg total) by mouth 2 (two) times daily with a meal. 180  tablet 1  . ELIQUIS 5 MG TABS tablet Take 1 tablet by mouth twice daily 60 tablet 3  . furosemide (LASIX) 40 MG tablet Take 2 tablets (80 mg total) by mouth in the morning AND 1.5 tablets (60 mg total) every evening. 105 tablet 6  . glucose blood (ACCU-CHEK AVIVA PLUS) test strip Use as instructed for 3 times daily testing of blood glucose 100 each 12  . guaiFENesin (MUCINEX PO) Take 1 tablet by mouth 2 (two) times daily as needed (cough/congestion).    . hydrALAZINE (APRESOLINE) 50 MG tablet Take 1.5 tablets (75 mg total) by mouth 2 (two) times daily. 90 tablet 3  . insulin glargine (LANTUS SOLOSTAR) 100 UNIT/ML Solostar Pen Inject 35 Units into the skin every morning. And pen needles 1/day 30 mL 3  . isosorbide mononitrate (IMDUR) 120 MG 24 hr tablet Take 1 tablet (120 mg total) by mouth daily. 30 tablet 3  . Lancet Devices (ACCU-CHEK SOFTCLIX) lancets Use as instructed for 3 times daily testing of blood glucose 1 each 0  . metFORMIN (GLUCOPHAGE) 1000 MG tablet Take 1 tablet (1,000 mg total) by mouth 2 (two) times daily with a meal. 180 tablet 1  . nitroGLYCERIN (NITROSTAT) 0.4 MG SL tablet Place 1 tablet (0.4 mg total) under the tongue every 5 (five) minutes as needed for chest pain (x 3 tabs daily). 60 tablet 1  . potassium chloride SA (KLOR-CON) 20 MEQ tablet Take 3 tablets (60 mEq total) by mouth daily. 90 tablet 6  . sotalol (BETAPACE) 120 MG tablet Take 1 tablet by mouth twice daily 180 tablet 3  . TRUEPLUS LANCETS 28G MISC 1 each by Does not apply route 3 (three) times daily. 100 each 12   No current facility-administered medications for this encounter.    BP (!) 142/76   Pulse 75   Wt 94.3 kg (208 lb)   SpO2 98%   BMI 28.21 kg/m   Wt Readings from Last 3 Encounters:  01/07/20 94.3 kg (208 lb)  12/31/19 95.7 kg (211 lb)  12/16/19 94.8 kg (209 lb)   PHYSICAL EXAM: ReDs Clip 40%  General:  Well appearing. No respiratory difficulty HEENT: normal Neck: supple. no JVD. Carotids  2+ bilat; no bruits. No lymphadenopathy or thyromegaly appreciated. Cor: PMI nondisplaced. Regular rate & rhythm. No rubs, gallops or murmurs. Lungs: clear Abdomen: soft, nontender, nondistended. No hepatosplenomegaly. No bruits or masses. Good bowel sounds. Extremities: no cyanosis, clubbing, rash, trace-1+ bilateral edema  Neuro: alert & oriented x 3, cranial nerves grossly intact. moves all 4 extremities w/o difficulty. Affect pleasant.  Assessment/Plan:  1. Chronic diastolic CHF: EF 96% on cardiac MRI in 6/14 but EF improved back to 55-60% on 2/16 echo.  However, EF down to 26% on Cardiolite in 9/16.  Echo in 9/20 showed EF 60-65%, moderate LVH, normal RV, mild-moderate MR. Echo repeated 5/21, LVEF 55%. RV normal. -Wt down 3 lb after recent diuretic dose adjustment but remains volume overloaded, ReDs Clip 40% w/ NYHA III symptoms.  - Increase Lasix to 80 mg bid x 2 days, then change to 80 mg qam/ 60 mg qpm. Increase KCl to 60 daily.  - BMET today and again in 7 days -  Cardiomems would be helpful to allow [place] to remotely monitor his volume status, we discussed this and he is agreeable to having one placed. Awaiting insurance approval  2. Atrial fibrillation/flutter: Paroxysmal. He has had atrial flutter ablations x 2 remotely.  He is on sotalol.  QTc ok on ECG today. He tolerates atrial arrhythmias poorly, seem to worsen CHF.  He was in atypical flutter last month, now back in NSR.  Zio patch 5/21 showed afib/flutter burden = 20%.  - Continue sotalol.  - Suspect untreated OSA may be contributing. Home sleep study has been ordered.  - Continue Eliquis.  Check CBC today  - Referral to EP for evaluation for repeat ablation (flutter versus fibrillation/flutter) has been placed. 3. Coronary artery disease: LHC in 9/16 showed diffuse CAD, most significant involving the distal RCA.  Patient had DES x 2 to RCA.  Cath in 3/17 with patent RCA stents and diffuse distal and branch vessel disease concerning  for poorly controlled diabetes.  Cath in 5/18 again showed diffuse CAD without good interventional target.  Cath in 11/20 showed severe distal and branch vessel disease but no interventional targets and not good anatomy for CABG.  He has had atypical chest pain.  - No ASA given Eliquis use.  - Continue atorvastatin 80 mg daily, good lipids 06/2019  - Continue Coreg, amlodipine, and Imdur 120 mg daily for angina control.  - Avoid ranolazine with sotalol use.  4. Hyperlipidemia:  On atorvastatin, good lipids 06/2019  5. Hypertension: historically controlled w/ medications.  BP mildly elevated today in the setting of fluid overload. Increase lasix per above  6. OSA: not on CPAP. Did not tolerate previously. We discussed CV affects of untreated OSA including potential for worsening HF and recurrent atrial arrhthymias. He is willing to retry CPAP. Home sleep study for repeat assessment as been ordered. Waiting mail delivery.  7. CKD: BMET today and in 1 week with Lasix increase.  8. Syncope: Most likely cough syncope. He denies further recurrence. Recent Zio patch showed no significant arrhthymias other than paroxsymal atrial flutter. Max HR 135. 2nd degree AVB was captured w/ rate 33 bpm occurring early morning hours at 1:24 am (? OSA). No pauses > 3 sec detected. Recent Echo w/ normal LVEF. No significant valvular dysfunction - no further w/u at this time.     Followup 3-4 wks to reassess volume.  Plan Cardiomems implant if approved.   Lyda Jester, PA-C  01/07/2020

## 2020-01-07 NOTE — Patient Instructions (Signed)
DOWNTIME WRITTEN AVS WAS GIVEN TO PATIENT STATING:   Increase Furosemide to 80 mg (2 tabs) Twice daily FOR 3 DAYS ONLY, then take: 80 mg (2 tabs) in AM and 60 mg (1 & 1/2 tabs) in PM  Increase Potassium to 60 meq (3 tabs) daily  Labs done today, your results will be available in MyChart, we will contact you for abnormal readings.  Labs needed in 1 week  You have been referred to EP for ablation  Your physician recommends that you schedule a follow-up appointment in: 1 month

## 2020-01-07 NOTE — Progress Notes (Signed)
Patient ID: [name] [name], male   DOB: 1956/02/21, 64 y.o.   MRN: [id number] PCP: [name], MD Cardiology: Dr. Aundra Dubin  64 y.o. with history of HTN, diabetes, paroxysmal atrial fibrillation, and CAD presents for cardiology followup.  He was admitted in 5/13 with chest pain with exertion.  LHC was done showing global HK with EF 45% and diffuse, severe distal and branch vessel disease.  There was no interventional option, but it was suspect that this disease could be causing his anginal-type pain.  Echo at that time was read as showing EF 60-65%.  He was admitted in 5/14 with hypertensive urgency and chest pain.  He ruled out for MI and BP was controlled.  His EF was 50-55% by echo.  ETT-Sestamibi done as outpatient showed small fixed inferior defect with no ischemia but EF was 31%.  He was quite hypertensive during the study.  Cardiac MRI done to confirm EF in 5/14 showed EF 44%.  He had an episode of atrial fibrillation/RVR in 3/15 but went back into NSR.  Echo in 2/16 showed EF 55-60% with moderate diastolic dysfunction.   He had a Cardiolite in 9/16 showing inferior ischemia.  He was having exertional dyspnea with less activity than normal.  There was concern for worsened CAD and he was sent for [facility] in 9/16. By RHC, filling pressures were not significantly elevated. He had a severe distal RCA stenosis that was treated with DES x 2.  Echo showed EF 55-60%.    In 10/16, he was admitted with acute cholecystitis.  He received a cholecystostomy tube.  Cholecystectomy done in 6/17.   Atrial fibrillation in 1/17, cardioverted to NSR.   Recurrent concerning chest pain in 3/17.  Repeat LHC showed diffuse distal and branch vessel disease consistent with poorly-controlled DM, but patent RCA stents.    He was admitted with atrial fibrillation in 12/17.  Started on sotalol and converted to NSR.    He had recurrent chest pain in 5/18 with abnormal Cardiolite.  Cath was done again in 5/18,  showing diffuse CAD with no good interventional options.   Admitted 11/20 with increased dyspnea and exertional chest pain. Hgb went down to 8.1. GI consulted.  Had [place] that showed severe distal and branch vessel disease but no interventional targets and not good anatomy for CABG. Continued on medical management. Capsule endoscopy without recent or active bleeding, no AVMs. He went into atrial flutter during his hospitalization. He went back into NSR, however, after discharge.      He has had episodes of syncope associated with vigorous coughing.   He was in the ER with atrial flutter and dyspnea in 4/21, back in NSR today on sotalol.  Zio patch was placed to assess AFib/AFL burden. Report reviewed today, Atrial Flutter occurred (20% burden), rates ranging from 36-135 bpm (average HR 67). Occasional PVC and couplets and ventricular trigeminy. No sustained VT. 2nd degree AVB was captured w/ rate 33 bpm occurring early morning hours at 66 am (? OSA). No pauses > 3 sec detected.  At last visit w/ Dr Aundra Dubin 5/5, he was volume overloaded and SOB w/ exertion. Lasix was increased to 80 qam/40 qpm and  KCl increased to 40 daily. He was recommended for cardiomems. 2D echo also repeated and showed normal LVEF 55%. RV normal. Repeat sleep study also ordered and pending.    Today in f/u, he notes slight improvement w/ diuretic adjustment but remains SOB w/ physical activities. Gets SOB walking his dog  short distances. Denies resting dyspnea. Reports full med compliance. Tries to watch salt intake. Denies CP. No syncope/ near syncope.     ECG (personally reviewed): NSR, 1st degree AVB, nonspecific ST-T changes. Occasional PVCs, 72 bpm, QT/QTc 430/470 ms  Labs (5/13): K 3.6, creatinine 0.93, LDL 85, HDL 57 Labs (7/13): K 3.9, creatinine 1.1, LDL 57, HDL 51  Labs (5/14): K 4, creatinine 1.25 Labs (8/14): K 3.7, creatinine 0.9, LDL 103, HDL 42 Labs (8/15): K 3.5 creatinine 1.0, BNP 75 Labs (2/16): K 3.8,  creatinine 1.1 Labs (9/16): K 3.5, creatinine 1.03, BNP 59, HDL 40, LDL 111, TGs 333 Labs (11/16): K 3.6, creaitnine 1.07, LFTs normal, LDL 44 Labs (6/17): K 3.4, creatinine 1.52 => 1.13 Labs (1/18): K 3.9, creatinine 1.25 Labs (6/18): K 3.8, creatinine 1.21, HCT 39.7 Labs (8/18): LDL 39, HDL 43 Labs (12/18): 4.4, creatinine 1.25, LFTs normal Labs (8/19): K 3.6, creatinine 1.13, LDL 68, HDL 49  Labs (11/20): K 3.5, creatinine 1.33, LDL 25 Labs (4/21): K 4.0, SCr 1.14, BNP 266  Labs (5/17): K 4.1, creatinine 1.41   PMH: 1. DM2 2. Gout 3. HTN: Cough with ACEI.  4. Cardiomyopathy: Suspect mixed ischemic/nonischemic (?ETOH-related).  EF 35% in 2008.  Echo in 5/13 showed EF 60-65% with moderate LVH but EF was 45% on LV-gram in 5/13.  Echo (5/14) with EF 50-55%, mild LVH, inferobasal HK, mild MR.  ETT-Sestamibi in 5/14, however, showed EF 31%.  Cardiac MRI (6/14) with EF 44%, mild global hypokinesis, subepicardial delayed enhancement in a nonspecific RV insertion site pattern.  Echo (2/16) with EF 55-60%, moderate diastolic dysfunction. RHC (9/16) with mean RA 8, PA 39/20 mean 28, mean PCWP 14, CI 2.2. Echo (9/16) with EF 55-60%, grade II diastolic dysfunction.  - Echo (6/17) with EF 55-60%, moderate LVH, grade II diastolic dysfunction, mild AI, mild MR.  - RHC (11/20): mean RA 4, PA 46/14 mean 26, mean PCWP 13, CI 3.23 - Echo (9/20): EF 60-65%, moderate LVH, normal RV size and systolic function, mild-moderate MR, mild AI.  5. CAD: LHC in 2010 with mild nonobstructive disease.  LHC (5/13) with diffuse distal and branch vessel disease, mild global hypokinesis and EF 45%.  ETT-Sestamibi (5/14): EF 31%, small fixed inferior defect with no ischemia.  Lexiscan Cardiolite (9/16) with EF 26%, moderate inferior defect that was partially reversible, suggesting ischemia => High risk study. LHC (9/16) with 40-50% mLAD, diffuse up to 50% distal LAD, 90% ostium of branch off ramus, 95% dRCA => DES to RCA x 2, EF  55-60%.   - LHC (3/17) with diffuse branch and distal vessel disease c/w poorly controlled DM, RCA stents patent.  - LHC (5/18): Diffuse CAD with no good interventional option. 60% mRCA, 90% dLAD, 50% serial mid ramus stenoses, ramus branches with ostial 80-90% stenoses. - LHC (11/20): Severe distal and branch vessel disease but no interventional targets and not good anatomy for CABG  6. H/o chronic HBV 7. Osteochondroma left shoulder.  8. Hyperlipidemia 9. Paroxysmal atrial fibrillation: Coumadin.  Developed cough and increased ESR with amiodarone.  DCCV in 1/17.  Recurrent atrial fibrillation in 12/17, sotalol started.  10. Atrial flutter: had ablations in 2007 and 2008.  11. OSA on CPAP.  12. Acute cholecystitis (10/16): Cholecystostomy tube placed.  Cholecystectomy 6/17.  13. CKD 14. Anemia - Suspected GI bleed but EGD/c-scope 9/20 without significant findings.  - Capsule Endoscopy (11/20) without recent or active bleeding, no AVMs.  15. Syncope: Suspected cough syncope.  SH: Married, prior smoker.  Some ETOH, occasionally heavy in past.  Does use occasional marijuana. 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   FH: CAD  ROS: All systems reviewed and negative except as per HPI.   Current Outpatient Medications  Medication Sig Dispense Refill  . ACCU-CHEK SOFTCLIX LANCETS lancets Use as instructed for three times daily testing of blood glucose 100 each 12  . acetaminophen (TYLENOL) 500 MG tablet Take 1,000 mg by mouth every 6 (six) hours as needed (pain).    . amLODipine (NORVASC) 5 MG tablet Take 1 tablet by mouth once daily 90 tablet 3  . atorvastatin (LIPITOR) 80 MG tablet Take 1 tablet (80 mg total) by mouth daily. 90 tablet 1  . Blood Glucose Monitoring Suppl (ACCU-CHEK AVIVA PLUS) w/Device KIT Use as directed for 3 times daily testing of blood glucose 1 kit 0  . carvedilol (COREG) 25 MG tablet Take 1 tablet (25 mg total) by mouth 2 (two) times daily with a meal. 180  tablet 1  . ELIQUIS 5 MG TABS tablet Take 1 tablet by mouth twice daily 60 tablet 3  . furosemide (LASIX) 40 MG tablet Take 2 tablets (80 mg total) by mouth in the morning AND 1.5 tablets (60 mg total) every evening. 105 tablet 6  . glucose blood (ACCU-CHEK AVIVA PLUS) test strip Use as instructed for 3 times daily testing of blood glucose 100 each 12  . guaiFENesin (MUCINEX PO) Take 1 tablet by mouth 2 (two) times daily as needed (cough/congestion).    . hydrALAZINE (APRESOLINE) 50 MG tablet Take 1.5 tablets (75 mg total) by mouth 2 (two) times daily. 90 tablet 3  . insulin glargine (LANTUS SOLOSTAR) 100 UNIT/ML Solostar Pen Inject 35 Units into the skin every morning. And pen needles 1/day 30 mL 3  . isosorbide mononitrate (IMDUR) 120 MG 24 hr tablet Take 1 tablet (120 mg total) by mouth daily. 30 tablet 3  . Lancet Devices (ACCU-CHEK SOFTCLIX) lancets Use as instructed for 3 times daily testing of blood glucose 1 each 0  . metFORMIN (GLUCOPHAGE) 1000 MG tablet Take 1 tablet (1,000 mg total) by mouth 2 (two) times daily with a meal. 180 tablet 1  . nitroGLYCERIN (NITROSTAT) 0.4 MG SL tablet Place 1 tablet (0.4 mg total) under the tongue every 5 (five) minutes as needed for chest pain (x 3 tabs daily). 60 tablet 1  . potassium chloride SA (KLOR-CON) 20 MEQ tablet Take 3 tablets (60 mEq total) by mouth daily. 90 tablet 6  . sotalol (BETAPACE) 120 MG tablet Take 1 tablet by mouth twice daily 180 tablet 3  . TRUEPLUS LANCETS 28G MISC 1 each by Does not apply route 3 (three) times daily. 100 each 12   No current facility-administered medications for this encounter.    BP (!) 142/76   Pulse 75   Wt 94.3 kg (208 lb)   SpO2 98%   BMI 28.21 kg/m   Wt Readings from Last 3 Encounters:  01/07/20 94.3 kg (208 lb)  12/31/19 95.7 kg (211 lb)  12/16/19 94.8 kg (209 lb)   PHYSICAL EXAM: ReDs Clip 40%  General:  Well appearing. No respiratory difficulty HEENT: normal Neck: supple. no JVD. Carotids  2+ bilat; no bruits. No lymphadenopathy or thyromegaly appreciated. Cor: PMI nondisplaced. Regular rate & rhythm. No rubs, gallops or murmurs. Lungs: clear Abdomen: soft, nontender, nondistended. No hepatosplenomegaly. No bruits or masses. Good bowel sounds. Extremities: no cyanosis, clubbing, rash, trace-1+ bilateral edema   Neuro: alert & oriented x 3, cranial nerves grossly intact. moves all 4 extremities w/o difficulty. Affect pleasant.  Assessment/Plan:  1. Chronic diastolic CHF: EF 96% on cardiac MRI in 6/14 but EF improved back to 55-60% on 2/16 echo.  However, EF down to 26% on Cardiolite in 9/16.  Echo in 9/20 showed EF 60-65%, moderate LVH, normal RV, mild-moderate MR. Echo repeated 5/21, LVEF 55%. RV normal. -Wt down 3 lb after recent diuretic dose adjustment but remains volume overloaded, ReDs Clip 40% w/ NYHA III symptoms.  - Increase Lasix to 80 mg bid x 2 days, then change to 80 mg qam/ 60 mg qpm. Increase KCl to 60 daily.  - BMET today and again in 7 days -  Cardiomems would be helpful to allow [place] to remotely monitor his volume status, we discussed this and he is agreeable to having one placed. Awaiting insurance approval  2. Atrial fibrillation/flutter: Paroxysmal. He has had atrial flutter ablations x 2 remotely.  He is on sotalol.  QTc ok on ECG today. He tolerates atrial arrhythmias poorly, seem to worsen CHF.  He was in atypical flutter last month, now back in NSR.  Zio patch 5/21 showed afib/flutter burden = 20%.  - Continue sotalol.  - Suspect untreated OSA may be contributing. Home sleep study has been ordered.  - Continue Eliquis.  Check CBC today  - Referral to EP for evaluation for repeat ablation (flutter versus fibrillation/flutter) has been placed. 3. Coronary artery disease: LHC in 9/16 showed diffuse CAD, most significant involving the distal RCA.  Patient had DES x 2 to RCA.  Cath in 3/17 with patent RCA stents and diffuse distal and branch vessel disease concerning  for poorly controlled diabetes.  Cath in 5/18 again showed diffuse CAD without good interventional target.  Cath in 11/20 showed severe distal and branch vessel disease but no interventional targets and not good anatomy for CABG.  He has had atypical chest pain.  - No ASA given Eliquis use.  - Continue atorvastatin 80 mg daily, good lipids 06/2019  - Continue Coreg, amlodipine, and Imdur 120 mg daily for angina control.  - Avoid ranolazine with sotalol use.  4. Hyperlipidemia:  On atorvastatin, good lipids 06/2019  5. Hypertension: historically controlled w/ medications.  BP mildly elevated today in the setting of fluid overload. Increase lasix per above  6. OSA: not on CPAP. Did not tolerate previously. We discussed CV affects of untreated OSA including potential for worsening HF and recurrent atrial arrhthymias. He is willing to retry CPAP. Home sleep study for repeat assessment as been ordered. Waiting mail delivery.  7. CKD: BMET today and in 1 week with Lasix increase.  8. Syncope: Most likely cough syncope. He denies further recurrence. Recent Zio patch showed no significant arrhthymias other than paroxsymal atrial flutter. Max HR 135. 2nd degree AVB was captured w/ rate 33 bpm occurring early morning hours at 66 am (? OSA). No pauses > 3 sec detected. Recent Echo w/ normal LVEF. No significant valvular dysfunction - no further w/u at this time.     Followup 3-4 wks to reassess volume.  Plan Cardiomems implant if approved.   Lyda Jester, PA-C  01/07/2020

## 2020-01-08 NOTE — Addendum Note (Signed)
Encounter addended by: Micki Riley, RN on: 01/08/2020 4:15 PM  Actions taken: Imaging Exam ended

## 2020-01-11 ENCOUNTER — Other Ambulatory Visit (HOSPITAL_COMMUNITY): Payer: Self-pay | Admitting: Cardiology

## 2020-01-11 DIAGNOSIS — I2510 Atherosclerotic heart disease of native coronary artery without angina pectoris: Secondary | ICD-10-CM

## 2020-01-11 DIAGNOSIS — I480 Paroxysmal atrial fibrillation: Secondary | ICD-10-CM

## 2020-01-11 DIAGNOSIS — I5032 Chronic diastolic (congestive) heart failure: Secondary | ICD-10-CM

## 2020-01-15 ENCOUNTER — Ambulatory Visit (HOSPITAL_COMMUNITY)
Admission: RE | Admit: 2020-01-15 | Discharge: 2020-01-15 | Disposition: A | Discharge status: Home / Self Care | Payer: Medicare Other | Source: Ambulatory Visit | Attending: Cardiology | Admitting: Cardiology

## 2020-01-15 ENCOUNTER — Other Ambulatory Visit: Payer: Self-pay

## 2020-01-15 DIAGNOSIS — I5032 Chronic diastolic (congestive) heart failure: Secondary | ICD-10-CM | POA: Diagnosis not present

## 2020-01-15 LAB — BRAIN NATRIURETIC PEPTIDE: B Natriuretic Peptide: 123.6 pg/mL — ABNORMAL HIGH (ref 0.0–100.0)

## 2020-01-15 LAB — BASIC METABOLIC PANEL
Anion gap: 12 (ref 5–15)
BUN: 10 mg/dL (ref 8–23)
CO2: 22 mmol/L (ref 22–32)
Calcium: 9.2 mg/dL (ref 8.9–10.3)
Chloride: 104 mmol/L (ref 98–111)
Creatinine, Ser: 1.3 mg/dL — ABNORMAL HIGH (ref 0.61–1.24)
GFR calc Af Amer: >60 mL/min (ref >60)
GFR calc non Af Amer: 57 mL/min — ABNORMAL LOW (ref >60)
Glucose, Bld: 176 mg/dL — ABNORMAL HIGH (ref 70–99)
Potassium: 3.4 mmol/L — ABNORMAL LOW (ref 3.5–5.1)
Sodium: 138 mmol/L (ref 135–145)

## 2020-01-18 ENCOUNTER — Telehealth (HOSPITAL_COMMUNITY): Payer: Self-pay | Admitting: Cardiology

## 2020-01-18 DIAGNOSIS — I5032 Chronic diastolic (congestive) heart failure: Secondary | ICD-10-CM

## 2020-01-18 NOTE — Telephone Encounter (Signed)
-----   Message from Consuelo Pandy, [place] sent at 01/18/2020  5:18 PM EDT ----- Labs stable. K 3.4 but we increased benazapril today, which should help. Repeat BMP in 7 days

## 2020-01-18 NOTE — Telephone Encounter (Signed)
Pt aware and voiced understanding, repeat labs 6/16

## 2020-01-20 ENCOUNTER — Telehealth (HOSPITAL_COMMUNITY): Payer: Self-pay | Admitting: Pharmacy Technician

## 2020-01-20 ENCOUNTER — Telehealth (HOSPITAL_COMMUNITY): Payer: Self-pay | Admitting: Cardiology

## 2020-01-20 NOTE — Telephone Encounter (Signed)
Patient called to report request co pay assistance for eliquis   Advised would forward to pharmacy team for further review, samples provided until resolution of some kind  is met.  Medication Samples have been provided to the patient.  Drug name: eliquis       Strength: 5 mg        Qty: 28  LOT: [id number]  Exp.Date: 11/2021  Dosing instructions: one tab twice daily  The patient has been instructed regarding the correct time, dose, and frequency of taking this medication, including desired effects and most common side effects.   [name] [name] 12:23 PM 01/20/2020

## 2020-01-20 NOTE — Telephone Encounter (Signed)
Patient is supposed to have insurance this month, (stated that he lost it for a little while). I could find no active coverage for him, Medicaid says that he needs to file his part D on prescriptions. Per Chantel, samples were provided.  Spoke with patient, advised him to call [facility] and see if his benefits have been re-instated and if so, I can check his co-pay. If he is still without insurance, we will seek patient assistance.  Will follow up.

## 2020-01-25 ENCOUNTER — Encounter (HOSPITAL_COMMUNITY): Payer: Self-pay | Admitting: Cardiology

## 2020-01-27 ENCOUNTER — Ambulatory Visit (HOSPITAL_COMMUNITY)
Admission: RE | Admit: 2020-01-27 | Discharge: 2020-01-27 | Disposition: A | Discharge status: Home / Self Care | Payer: Medicare Other | Source: Ambulatory Visit | Attending: Internal Medicine | Admitting: Internal Medicine

## 2020-01-27 ENCOUNTER — Other Ambulatory Visit (HOSPITAL_COMMUNITY): Payer: Self-pay

## 2020-01-27 ENCOUNTER — Other Ambulatory Visit: Payer: Self-pay

## 2020-01-27 DIAGNOSIS — I5032 Chronic diastolic (congestive) heart failure: Secondary | ICD-10-CM | POA: Diagnosis not present

## 2020-01-27 LAB — BASIC METABOLIC PANEL
Anion gap: 10 (ref 5–15)
BUN: 11 mg/dL (ref 8–23)
CO2: 22 mmol/L (ref 22–32)
Calcium: 8.9 mg/dL (ref 8.9–10.3)
Chloride: 101 mmol/L (ref 98–111)
Creatinine, Ser: 1.16 mg/dL (ref 0.61–1.24)
GFR calc Af Amer: >60 mL/min (ref >60)
GFR calc non Af Amer: >60 mL/min (ref >60)
Glucose, Bld: 525 mg/dL (ref 70–99)
Potassium: 4 mmol/L (ref 3.5–5.1)
Sodium: 133 mmol/L — ABNORMAL LOW (ref 135–145)

## 2020-01-27 NOTE — Progress Notes (Signed)
Orders Placed This Encounter  Procedures  . CBC    Standing Status:   Future    Standing Expiration Date:   01/26/2021    Order Specific Question:   Release to patient    Answer:   Immediate

## 2020-01-28 ENCOUNTER — Encounter (HOSPITAL_COMMUNITY): Payer: Self-pay | Admitting: Cardiology

## 2020-01-28 DIAGNOSIS — D509 Iron deficiency anemia, unspecified: Secondary | ICD-10-CM | POA: Insufficient documentation

## 2020-01-28 DIAGNOSIS — E1165 Type 2 diabetes mellitus with hyperglycemia: Secondary | ICD-10-CM | POA: Insufficient documentation

## 2020-01-28 DIAGNOSIS — I5043 Acute on chronic combined systolic (congestive) and diastolic (congestive) heart failure: Secondary | ICD-10-CM | POA: Insufficient documentation

## 2020-01-28 DIAGNOSIS — E871 Hypo-osmolality and hyponatremia: Secondary | ICD-10-CM | POA: Insufficient documentation

## 2020-01-28 DIAGNOSIS — I130 Hypertensive heart and chronic kidney disease with heart failure and stage 1 through stage 4 chronic kidney disease, or unspecified chronic kidney disease: Secondary | ICD-10-CM | POA: Diagnosis not present

## 2020-01-28 DIAGNOSIS — N289 Disorder of kidney and ureter, unspecified: Secondary | ICD-10-CM | POA: Diagnosis not present

## 2020-01-28 DIAGNOSIS — N183 Chronic kidney disease, stage 3 unspecified: Secondary | ICD-10-CM | POA: Diagnosis not present

## 2020-01-28 DIAGNOSIS — Z87891 Personal history of nicotine dependence: Secondary | ICD-10-CM | POA: Insufficient documentation

## 2020-01-28 DIAGNOSIS — Z79899 Other long term (current) drug therapy: Secondary | ICD-10-CM | POA: Diagnosis not present

## 2020-01-28 DIAGNOSIS — E1122 Type 2 diabetes mellitus with diabetic chronic kidney disease: Secondary | ICD-10-CM | POA: Insufficient documentation

## 2020-01-28 DIAGNOSIS — Z794 Long term (current) use of insulin: Secondary | ICD-10-CM | POA: Insufficient documentation

## 2020-01-28 DIAGNOSIS — R739 Hyperglycemia, unspecified: Secondary | ICD-10-CM | POA: Diagnosis present

## 2020-01-28 DIAGNOSIS — Z9861 Coronary angioplasty status: Secondary | ICD-10-CM | POA: Diagnosis not present

## 2020-01-29 ENCOUNTER — Emergency Department (HOSPITAL_COMMUNITY)
Admission: EM | Admit: 2020-01-29 | Discharge: 2020-01-29 | Disposition: A | Discharge status: Home / Self Care | Payer: Medicare Other | Attending: Emergency Medicine | Admitting: Emergency Medicine

## 2020-01-29 ENCOUNTER — Telehealth: Payer: Self-pay | Admitting: Internal Medicine

## 2020-01-29 ENCOUNTER — Encounter (HOSPITAL_COMMUNITY): Payer: Self-pay | Admitting: Emergency Medicine

## 2020-01-29 ENCOUNTER — Other Ambulatory Visit: Payer: Self-pay

## 2020-01-29 DIAGNOSIS — I10 Essential (primary) hypertension: Secondary | ICD-10-CM

## 2020-01-29 DIAGNOSIS — E871 Hypo-osmolality and hyponatremia: Secondary | ICD-10-CM

## 2020-01-29 DIAGNOSIS — D509 Iron deficiency anemia, unspecified: Secondary | ICD-10-CM

## 2020-01-29 DIAGNOSIS — R739 Hyperglycemia, unspecified: Secondary | ICD-10-CM

## 2020-01-29 DIAGNOSIS — N289 Disorder of kidney and ureter, unspecified: Secondary | ICD-10-CM

## 2020-01-29 LAB — URINALYSIS, ROUTINE W REFLEX MICROSCOPIC
Bacteria, UA: NONE SEEN
Bilirubin Urine: NEGATIVE
Glucose, UA: 150 mg/dL — AB
Ketones, ur: NEGATIVE mg/dL
Leukocytes,Ua: NEGATIVE
Nitrite: NEGATIVE
Protein, ur: NEGATIVE mg/dL
Specific Gravity, Urine: 1.019 (ref 1.005–1.030)
pH: 5 (ref 5.0–8.0)

## 2020-01-29 LAB — BASIC METABOLIC PANEL
Anion gap: 10 (ref 5–15)
BUN: 23 mg/dL (ref 8–23)
CO2: 23 mmol/L (ref 22–32)
Calcium: 9.3 mg/dL (ref 8.9–10.3)
Chloride: 98 mmol/L (ref 98–111)
Creatinine, Ser: 1.29 mg/dL — ABNORMAL HIGH (ref 0.61–1.24)
GFR calc Af Amer: >60 mL/min (ref >60)
GFR calc non Af Amer: 58 mL/min — ABNORMAL LOW (ref >60)
Glucose, Bld: 334 mg/dL — ABNORMAL HIGH (ref 70–99)
Potassium: 3.8 mmol/L (ref 3.5–5.1)
Sodium: 131 mmol/L — ABNORMAL LOW (ref 135–145)

## 2020-01-29 LAB — CBC
HCT: 39.9 % (ref 39.0–52.0)
Hemoglobin: 11.6 g/dL — ABNORMAL LOW (ref 13.0–17.0)
MCH: 20.4 pg — ABNORMAL LOW (ref 26.0–34.0)
MCHC: 29.1 g/dL — ABNORMAL LOW (ref 30.0–36.0)
MCV: 70.1 fL — ABNORMAL LOW (ref 80.0–100.0)
Platelets: 330 10*3/uL (ref 150–400)
RBC: 5.69 MIL/uL (ref 4.22–5.81)
RDW: 20.8 % — ABNORMAL HIGH (ref 11.5–15.5)
WBC: 7.8 10*3/uL (ref 4.0–10.5)
nRBC: 0 % (ref 0.0–0.2)

## 2020-01-29 LAB — CBG MONITORING, ED
Glucose-Capillary: 242 mg/dL — ABNORMAL HIGH (ref 70–99)
Glucose-Capillary: 320 mg/dL — ABNORMAL HIGH (ref 70–99)

## 2020-01-29 MED ORDER — INSULIN ASPART 100 UNIT/ML ~~LOC~~ SOLN
5.0000 [IU] | Freq: Once | SUBCUTANEOUS | Status: AC
  Administered 2020-01-29: 5 [IU] via SUBCUTANEOUS
  Filled 2020-01-29: qty 0.05

## 2020-01-29 NOTE — Telephone Encounter (Signed)
01/28/20 cbg 476 no symptoms with DM 2 and feeling dizzy and sick and weak rec go to ED daughter Orlie Dakin took him at 58 pm [place] may have been what he was eating or cheerwine soda  Also hand is swollen   Cards ablation 6/21 scheduled h/o CHF  Will CC PCP

## 2020-01-29 NOTE — ED Provider Notes (Signed)
[facility] DEPT Provider Note   CSN: [id number] Arrival date & time: 01/28/20  2359   History Chief Complaint  Patient presents with  . Hyperglycemia    [name] [name] is a 64 y.o. male.  The history is provided by the patient.  Hyperglycemia He has history of hypertension, diabetes, hyperlipidemia, coronary artery disease, chronic diastolic heart failure and comes in because of elevated glucose at home.  Glucose had reached 405.  He normally keeps his glucose under 200.  He has noted polyuria but denies polydipsia.  He denies any infection.  He denies fever or chills.  He is concerned because he is scheduled to have a cardiac catheterization done in 4 days.  Past Medical History:  Diagnosis Date  . Anginal pain ([place])   . Anxiety   . Arrhythmia   . CAD (coronary artery disease), native coronary artery    a. Nonobstructive CAD by cath 2013 - diffuse distal and branch vessel CAD, no severe disease in the major coronaries, LV mild global hypokinesis, EF 45%. b. ETT-Sestamibi 5/14: EF 31%, small fixed inferior defect with no ischemia.  . Cholecystitis   . Chronic CHF ([place])    a. Mixed ICM/NICM (?EtOH). EF 35% in 2008. Echo 5/13: EF 60-65%, mod LVH, EF 45% on V gram in 12/2011. EF 12/2012: EF 50-55%, mild LVH, inferobasal HK, mild MR. ETT-Ses 5/14 EF 41%. Cardiac MRI 5/14: EF 44%, mild global HK, subepicardial delayed enhancement in nonspecific RV insertion pattern.  . COLONIC POLYPS, HX OF 12/30/2006  . Gout   . H/O atrial flutter 2007   a. Ablations in 2007, 2008.  [name] Heart murmur   . HEPATITIS B, CHRONIC 12/30/2006  . History of alcohol abuse   . History of hiatal hernia   . History of medication noncompliance   . History of nuclear stress test    Myoview 3/17: + chest pain; EF 33%, downsloping ST depression 2, V4-6, LVH with strain, no ischemia on images; intermediate risk   . HYPERCHOLESTEROLEMIA 07/11/2010  . Hypertension   . Left sciatic  nerve pain since 04/2015  . LIVER FUNCTION TESTS, ABNORMAL, HX OF 12/30/2006  . MITRAL REGURGITATION 12/30/2006  . 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 PAF (paroxysmal atrial fibrillation) (HCC)    On coumadin  . Rectal bleeding 12/18/2011   Scheduled for colonoscopy.    . Shortness of breath dyspnea    with excertion  . Sleep apnea    "suppose to send mask but they never did" (05/03/2015)  . Type II diabetes mellitus [facility])     Patient Active Problem List   Diagnosis Date Noted  . Generalized weakness 06/27/2019  . Acute on chronic congestive heart failure with left ventricular diastolic dysfunction ([place]) 06/26/2019  . Anemia due to chronic kidney disease 06/26/2019  . CKD (chronic kidney disease), stage III 06/26/2019  . Type 2 diabetes mellitus without complication ([place]) 40/98/1191  . Iron deficiency anemia due to chronic blood loss 06/26/2019  . History of atrial fibrillation   . History of type 2 diabetes mellitus   . Anemia   . Chest pain 04/24/2019  . Overweight (BMI 25.0-29.9) 02/28/2017  . Unstable angina ([place]) 08/03/2016  . S/P laparoscopic cholecystectomy 02/07/2016  . Coronary artery disease involving native coronary artery with unstable angina pectoris ([place]) 12/29/2015  . Diabetes ([place]) 11/12/2015  . Chronic anticoagulation 08/22/2015  . Atrial flutter with controlled response ([place]) 08/22/2015  . RUQ pain   . Cholelithiases-  drain in place 06/04/2015  . Essential hypertension   . Chronic diastolic CHF (congestive heart failure) ([place]) 04/24/2015  . Excessive daytime sleepiness 11/25/2014  . Obstructive sleep apnea 11/25/2014  . NICM (nonischemic cardiomyopathy) ([place]) 03/11/2014  . Anticoagulation goal of INR 2 to 3 10/19/2013  . Eczema 10/19/2013  . Low libido 10/16/2013  . Persistent atrial fibrillation ([place]) 10/06/2013  . Rectal bleeding 12/18/2011  . HYPERCHOLESTEROLEMIA 07/11/2010  . DENTAL CARIES 06/30/2010  . GOUT 05/05/2009  . HEPATITIS B,  CHRONIC 12/30/2006  . OSTEOCHONDROMA 12/30/2006  . Mitral valve disorder 12/30/2006  . LIVER FUNCTION TESTS, ABNORMAL, HX OF 12/30/2006  . COLONIC POLYPS, HX OF 12/30/2006  . COLONOSCOPY, HX OF 05/12/2001    Past Surgical History:  Procedure Laterality Date  . A FLUTTER ABLATION  2007, 2008   catheter ablation   . CARDIAC CATHETERIZATION N/A 05/03/2015   Procedure: Right/Left Heart Cath and Coronary Angiography;  Surgeon: Larey Dresser, MD;  Location: [place] [facility];  Service: Cardiovascular;  Laterality: N/A;  . CARDIAC CATHETERIZATION N/A 05/03/2015   Procedure: Coronary Stent Intervention;  Surgeon: Jettie Booze, MD;  Location: [place] [facility];  Service: Cardiovascular;  Laterality: N/A;  . CARDIAC CATHETERIZATION N/A 11/04/2015   Procedure: Left Heart Cath and Coronary Angiography;  Surgeon: Larey Dresser, MD;  Location: [place] [facility];  Service: Cardiovascular;  Laterality: N/A;  . CARDIOVERSION N/A 08/24/2015   Procedure: CARDIOVERSION;  Surgeon: Thayer Headings, MD;  Location: [facility] ENDOSCOPY;  Service: Cardiovascular;  Laterality: N/A;  . CHOLECYSTECTOMY N/A 02/07/2016   Procedure: LAPAROSCOPIC CHOLECYSTECTOMY WITH  INTRAOPERATIVE CHOLANGIOGRAM;  Surgeon: Greer Pickerel, MD;  Location: [facility];  Service: General;  Laterality: N/A;  . COLONOSCOPY WITH PROPOFOL N/A 04/28/2019   Procedure: COLONOSCOPY WITH PROPOFOL;  Surgeon: Ronnette Juniper, MD;  Location: [place];  Service: Gastroenterology;  Laterality: N/A;  . CORONARY ANGIOPLASTY  12/05/01  . ESOPHAGOGASTRODUODENOSCOPY (EGD) WITH PROPOFOL N/A 04/27/2019   Procedure: ESOPHAGOGASTRODUODENOSCOPY (EGD) WITH PROPOFOL;  Surgeon: Ronnette Juniper, MD;  Location: [place];  Service: Gastroenterology;  Laterality: N/A;  . GIVENS CAPSULE STUDY N/A 06/29/2019   Procedure: GIVENS CAPSULE STUDY;  Surgeon: Wilford Corner, MD;  Location: [place];  Service: Endoscopy;  Laterality: N/A;  . LEFT HEART CATH AND CORONARY ANGIOGRAPHY  N/A 12/28/2016   Procedure: Left Heart Cath and Coronary Angiography;  Surgeon: Larey Dresser, MD;  Location: [place] [facility];  Service: Cardiovascular;  Laterality: N/A;  . LEFT HEART CATHETERIZATION WITH CORONARY ANGIOGRAM N/A 12/19/2011   Procedure: LEFT HEART CATHETERIZATION WITH CORONARY ANGIOGRAM;  Surgeon: Larey Dresser, MD;  Location: [facility] CATH LAB;  Service: Cardiovascular;  Laterality: N/A;  . OSTEOCHONDROMA EXCISION Left 1972   "took bone tumor off my shoulder"  . POLYPECTOMY  04/28/2019   Procedure: POLYPECTOMY;  Surgeon: Ronnette Juniper, MD;  Location: [facility] ENDOSCOPY;  Service: Gastroenterology;;  . RIGHT/LEFT HEART CATH AND CORONARY ANGIOGRAPHY N/A 07/01/2019   Procedure: RIGHT/LEFT HEART CATH AND CORONARY ANGIOGRAPHY;  Surgeon: Larey Dresser, MD;  Location: [place] [facility];  Service: Cardiovascular;  Laterality: N/A;       Family History  Problem Relation Age of Onset  . Diabetes Mother   . Hypertension Mother   . Heart attack Neg Hx   . Stroke Neg Hx     Social History   Tobacco Use  . Smoking status: Former Smoker    Packs/day: 4.00    Years: 18.00    Pack years: 72.00  Types: Cigarettes    Quit date: 08/14/1983    Years since quitting: 36.4  . Smokeless tobacco: Never Used  Vaping Use  . Vaping Use: Never used  Substance Use Topics  . Alcohol use: Yes    Alcohol/week: 16.0 standard drinks    Types: 6 Cans of beer, 10 Shots of liquor per week    Comment: occasionally  . Drug use: Yes    Types: Marijuana    Comment: twice a week    Home Medications Prior to Admission medications   Medication Sig Start Date End Date Taking? Authorizing Provider  ACCU-CHEK SOFTCLIX LANCETS lancets Use as instructed for three times daily testing of blood glucose 09/06/16   Jegede, Olugbemiga E, MD  acetaminophen (TYLENOL) 500 MG tablet Take 1,000 mg by mouth every 6 (six) hours as needed (pain).    [provider]  amLODipine (NORVASC) 5 MG tablet Take 1 tablet  by mouth once daily Patient taking differently: Take 5 mg by mouth daily.  10/13/19   Larey Dresser, MD  atorvastatin (LIPITOR) 80 MG tablet Take 1 tablet (80 mg total) by mouth daily. 07/17/19   Larey Dresser, MD  Blood Glucose Monitoring Suppl (ACCU-CHEK AVIVA PLUS) w/Device KIT Use as directed for 3 times daily testing of blood glucose 09/06/16   Jegede, Olugbemiga E, MD  carvedilol (COREG) 25 MG tablet TAKE 1 TABLET BY MOUTH TWICE DAILY WITH A MEAL Patient taking differently: Take 25 mg by mouth 2 (two) times daily with a meal.  01/12/20   Larey Dresser, MD  ELIQUIS 5 MG TABS tablet Take 1 tablet by mouth twice daily Patient taking differently: Take 5 mg by mouth 2 (two) times daily.  12/08/19   Bensimhon, Shaune Pascal, MD  furosemide (LASIX) 40 MG tablet Take 2 tablets (80 mg total) by mouth in the morning AND 1.5 tablets (60 mg total) every evening. 01/07/20   Lyda Jester M, PA-C  glucose blood (ACCU-CHEK AVIVA PLUS) test strip Use as instructed for 3 times daily testing of blood glucose 09/06/16   Jegede, Olugbemiga E, MD  guaiFENesin (MUCINEX PO) Take 1 tablet by mouth 2 (two) times daily as needed (cough/congestion).    [provider]  hydrALAZINE (APRESOLINE) 50 MG tablet Take 1.5 tablets (75 mg total) by mouth 2 (two) times daily. 08/31/19 01/06/21  Bensimhon, Shaune Pascal, MD  insulin glargine (LANTUS SOLOSTAR) 100 UNIT/ML Solostar Pen Inject 35 Units into the skin every morning. And pen needles 1/day 11/20/19   [name], MD  isosorbide mononitrate (IMDUR) 120 MG 24 hr tablet Take 1 tablet (120 mg total) by mouth daily. 08/31/19 01/06/21  Bensimhon, Shaune Pascal, MD  Lancet Devices [facility]) lancets Use as instructed for 3 times daily testing of blood glucose 09/06/16   Tresa Garter, MD  metFORMIN (GLUCOPHAGE) 1000 MG tablet Take 1 tablet (1,000 mg total) by mouth 2 (two) times daily with a meal. 05/02/19   [name], MD  nitroGLYCERIN (NITROSTAT) 0.4 MG SL  tablet Place 1 tablet (0.4 mg total) under the tongue every 5 (five) minutes as needed for chest pain (x 3 tabs daily). 09/29/18   Charlott Rakes, MD  potassium chloride SA (KLOR-CON) 20 MEQ tablet Take 3 tablets (60 mEq total) by mouth daily. 01/07/20   Lyda Jester M, PA-C  sotalol (BETAPACE) 120 MG tablet Take 1 tablet by mouth twice daily Patient taking differently: Take 120 mg by mouth 2 (two) times daily.  01/06/20  Larey Dresser, MD  TRUEPLUS LANCETS 28G MISC 1 each by Does not apply route 3 (three) times daily. 08/22/16   Tresa Garter, MD    Allergies    Shrimp [shellfish allergy], Ace inhibitors, and Other  Review of Systems   Review of Systems  All other systems reviewed and are negative.   Physical Exam Updated Vital Signs BP (!) 159/87 (BP Location: Left Arm)   Pulse 74   Temp 98.2 F (36.8 C) (Oral)   Resp 16   Ht 6' (1.829 m)   Wt 93 kg   SpO2 96%   BMI 27.80 kg/m   Physical Exam Vitals and nursing note reviewed.   64 year old male, resting comfortably and in no acute distress. Vital signs are significant for elevated blood pressure. Oxygen saturation is 96%, which is normal. Head is normocephalic and atraumatic. PERRLA, EOMI. Oropharynx is clear. Neck is nontender and supple without adenopathy or JVD. Back is nontender and there is no CVA tenderness. Lungs are clear without rales, wheezes, or rhonchi. Chest is nontender. Heart has regular rate and rhythm without murmur. Abdomen is soft, flat, nontender without masses or hepatosplenomegaly and peristalsis is normoactive. Extremities have no cyanosis or edema, full range of motion is present. Skin is warm and dry without rash. Neurologic: Mental status is normal, cranial nerves are intact, there are no motor or sensory deficits.  ED Results / Procedures / Treatments   Labs (all labs ordered are listed, but only abnormal results are displayed) Labs Reviewed  BASIC METABOLIC PANEL - Abnormal;  Notable for the following components:      Result Value   Sodium 131 (*)    Glucose, Bld 334 (*)    Creatinine, Ser 1.29 (*)    GFR calc non Af Amer 58 (*)    All other components within normal limits  CBC - Abnormal; Notable for the following components:   Hemoglobin 11.6 (*)    MCV 70.1 (*)    MCH 20.4 (*)    MCHC 29.1 (*)    RDW 20.8 (*)    All other components within normal limits  URINALYSIS, ROUTINE W REFLEX MICROSCOPIC - Abnormal; Notable for the following components:   Glucose, UA 150 (*)    Hgb urine dipstick MODERATE (*)    All other components within normal limits  CBG MONITORING, ED - Abnormal; Notable for the following components:   Glucose-Capillary 320 (*)    All other components within normal limits  CBG MONITORING, ED - Abnormal; Notable for the following components:   Glucose-Capillary 242 (*)    All other components within normal limits  CBG MONITORING, ED   Procedures Procedures   Medications Ordered in ED Medications  insulin aspart (novoLOG) injection 5 Units (5 Units Subcutaneous Given 01/29/20 0225)    ED Course  I have reviewed the triage vital signs and the nursing notes.  Pertinent lab results that were available during my care of the patient were reviewed by me and considered in my medical decision making (see chart for details).  MDM Rules/Calculators/A&P Hyperglycemia.  Labs in the ED show mild to moderate hyperglycemia, mild hyponatremia which is partly secondary to hyperglycemia and not felt to be clinically significant.  Mild renal insufficiency is present and unchanged from baseline.  CO2 is normal and anion gap is normal.  Mild anemia present is also unchanged from baseline.  Urinalysis has no ketones.  No evidence of ketoacidosis.  Old records are reviewed confirming scheduled  for cardiac catheterization on 6/21.  He is given a dose of insulin in the ED.  Glucose is come down to 242.  He is discharged with instructions to continue home  glucose monitoring, follow-up with PCP and with his cardiologist.  Final Clinical Impression(s) / ED Diagnoses Final diagnoses:  Hyperglycemia  Hyponatremia  Renal insufficiency  Microcytic anemia  Elevated blood pressure reading with diagnosis of hypertension    Rx / DC Orders ED Discharge Orders    None       Delora Fuel, MD 82/35/77 [phone]

## 2020-01-29 NOTE — ED Triage Notes (Addendum)
Patient complaining of high blood sugar. Patient states his sugar was 413 at home. Patient states that he called his md and they told him to come to the ED.

## 2020-01-29 NOTE — Discharge Instructions (Addendum)
Continue monitoring your blood sugars at home.

## 2020-02-01 ENCOUNTER — Encounter (HOSPITAL_COMMUNITY)
Admission: RE | Disposition: A | Discharge status: Home / Self Care | Payer: Self-pay | Source: Home / Self Care | Attending: Cardiology

## 2020-02-01 ENCOUNTER — Other Ambulatory Visit: Payer: Self-pay

## 2020-02-01 ENCOUNTER — Ambulatory Visit (HOSPITAL_COMMUNITY)
Admission: RE | Admit: 2020-02-01 | Discharge: 2020-02-01 | Disposition: A | Discharge status: Home / Self Care | Payer: Medicare Other | Attending: Cardiology | Admitting: Cardiology

## 2020-02-01 DIAGNOSIS — I25119 Atherosclerotic heart disease of native coronary artery with unspecified angina pectoris: Secondary | ICD-10-CM | POA: Insufficient documentation

## 2020-02-01 DIAGNOSIS — N189 Chronic kidney disease, unspecified: Secondary | ICD-10-CM | POA: Insufficient documentation

## 2020-02-01 DIAGNOSIS — M109 Gout, unspecified: Secondary | ICD-10-CM | POA: Diagnosis not present

## 2020-02-01 DIAGNOSIS — I5032 Chronic diastolic (congestive) heart failure: Secondary | ICD-10-CM | POA: Insufficient documentation

## 2020-02-01 DIAGNOSIS — E1122 Type 2 diabetes mellitus with diabetic chronic kidney disease: Secondary | ICD-10-CM | POA: Diagnosis not present

## 2020-02-01 DIAGNOSIS — E785 Hyperlipidemia, unspecified: Secondary | ICD-10-CM | POA: Insufficient documentation

## 2020-02-01 DIAGNOSIS — Z79899 Other long term (current) drug therapy: Secondary | ICD-10-CM | POA: Diagnosis not present

## 2020-02-01 DIAGNOSIS — I480 Paroxysmal atrial fibrillation: Secondary | ICD-10-CM | POA: Insufficient documentation

## 2020-02-01 DIAGNOSIS — Z7901 Long term (current) use of anticoagulants: Secondary | ICD-10-CM | POA: Diagnosis not present

## 2020-02-01 DIAGNOSIS — I428 Other cardiomyopathies: Secondary | ICD-10-CM | POA: Diagnosis not present

## 2020-02-01 DIAGNOSIS — I4892 Unspecified atrial flutter: Secondary | ICD-10-CM | POA: Diagnosis not present

## 2020-02-01 DIAGNOSIS — I509 Heart failure, unspecified: Secondary | ICD-10-CM

## 2020-02-01 DIAGNOSIS — Z794 Long term (current) use of insulin: Secondary | ICD-10-CM | POA: Diagnosis not present

## 2020-02-01 DIAGNOSIS — I130 Hypertensive heart and chronic kidney disease with heart failure and stage 1 through stage 4 chronic kidney disease, or unspecified chronic kidney disease: Secondary | ICD-10-CM | POA: Insufficient documentation

## 2020-02-01 DIAGNOSIS — G4733 Obstructive sleep apnea (adult) (pediatric): Secondary | ICD-10-CM | POA: Diagnosis not present

## 2020-02-01 HISTORY — PX: PRESSURE SENSOR/CARDIOMEMS: CATH118258

## 2020-02-01 HISTORY — PX: RIGHT HEART CATH: CATH118263

## 2020-02-01 LAB — POCT I-STAT EG7
Acid-base deficit: 1 mmol/L (ref 0.0–2.0)
Acid-base deficit: 1 mmol/L (ref 0.0–2.0)
Bicarbonate: 24.4 mmol/L (ref 20.0–28.0)
Bicarbonate: 24.6 mmol/L (ref 20.0–28.0)
Calcium, Ion: 1.31 mmol/L (ref 1.15–1.40)
Calcium, Ion: 1.32 mmol/L (ref 1.15–1.40)
HCT: 37 % — ABNORMAL LOW (ref 39.0–52.0)
HCT: 37 % — ABNORMAL LOW (ref 39.0–52.0)
Hemoglobin: 12.6 g/dL — ABNORMAL LOW (ref 13.0–17.0)
Hemoglobin: 12.6 g/dL — ABNORMAL LOW (ref 13.0–17.0)
O2 Saturation: 66 %
O2 Saturation: 72 %
Potassium: 3.6 mmol/L (ref 3.5–5.1)
Potassium: 3.6 mmol/L (ref 3.5–5.1)
Sodium: 141 mmol/L (ref 135–145)
Sodium: 141 mmol/L (ref 135–145)
TCO2: 26 mmol/L (ref 22–32)
TCO2: 26 mmol/L (ref 22–32)
pCO2, Ven: 43.1 mmHg — ABNORMAL LOW (ref 44.0–60.0)
pCO2, Ven: 44.1 mmHg (ref 44.0–60.0)
pH, Ven: 7.354 (ref 7.250–7.430)
pH, Ven: 7.361 (ref 7.250–7.430)
pO2, Ven: 36 mmHg (ref 32.0–45.0)
pO2, Ven: 40 mmHg (ref 32.0–45.0)

## 2020-02-01 LAB — GLUCOSE, CAPILLARY
Glucose-Capillary: 200 mg/dL — ABNORMAL HIGH (ref 70–99)
Glucose-Capillary: 203 mg/dL — ABNORMAL HIGH (ref 70–99)

## 2020-02-01 SURGERY — PRESSURE SENSOR/CARDIOMEMS
Anesthesia: LOCAL

## 2020-02-01 MED ORDER — HEPARIN (PORCINE) IN NACL 1000-0.9 UT/500ML-% IV SOLN
INTRAVENOUS | Status: AC
  Filled 2020-02-01: qty 500

## 2020-02-01 MED ORDER — SODIUM CHLORIDE 0.9% FLUSH: 3.0000 mL | INTRAVENOUS | Status: DC | PRN

## 2020-02-01 MED ORDER — SODIUM CHLORIDE 0.9% FLUSH: 3.0000 mL | Freq: Two times a day (BID) | INTRAVENOUS | Status: DC

## 2020-02-01 MED ORDER — MIDAZOLAM HCL 2 MG/2ML IJ SOLN
INTRAMUSCULAR | Status: AC
  Filled 2020-02-01: qty 2

## 2020-02-01 MED ORDER — LIDOCAINE HCL (PF) 1 % IJ SOLN
INTRAMUSCULAR | Status: DC | PRN
  Administered 2020-02-01: 30 mL via INTRADERMAL

## 2020-02-01 MED ORDER — SODIUM CHLORIDE 0.9 % IV SOLN: 250.0000 mL | INTRAVENOUS | Status: DC | PRN

## 2020-02-01 MED ORDER — HEPARIN (PORCINE) IN NACL 1000-0.9 UT/500ML-% IV SOLN
INTRAVENOUS | Status: DC | PRN
  Administered 2020-02-01 (×3): 500 mL

## 2020-02-01 MED ORDER — ONDANSETRON HCL 4 MG/2ML IJ SOLN: 4.0000 mg | Freq: Four times a day (QID) | INTRAMUSCULAR | Status: DC | PRN

## 2020-02-01 MED ORDER — ACETAMINOPHEN 325 MG PO TABS: 650.0000 mg | ORAL_TABLET | ORAL | Status: DC | PRN

## 2020-02-01 MED ORDER — HYDRALAZINE HCL 20 MG/ML IJ SOLN
10.0000 mg | INTRAMUSCULAR | Status: AC | PRN
  Administered 2020-02-01: 10 mg via INTRAVENOUS

## 2020-02-01 MED ORDER — FENTANYL CITRATE (PF) 100 MCG/2ML IJ SOLN
INTRAMUSCULAR | Status: DC | PRN
  Administered 2020-02-01: 25 ug via INTRAVENOUS

## 2020-02-01 MED ORDER — LABETALOL HCL 5 MG/ML IV SOLN: 10.0000 mg | INTRAVENOUS | Status: AC | PRN

## 2020-02-01 MED ORDER — IOHEXOL 350 MG/ML SOLN
INTRAVENOUS | Status: DC | PRN
  Administered 2020-02-01: 5 mL via INTRA_ARTERIAL

## 2020-02-01 MED ORDER — HYDRALAZINE HCL 20 MG/ML IJ SOLN
INTRAMUSCULAR | Status: AC
  Filled 2020-02-01: qty 1

## 2020-02-01 MED ORDER — SODIUM CHLORIDE 0.9 % IV SOLN: INTRAVENOUS | Status: DC

## 2020-02-01 MED ORDER — FENTANYL CITRATE (PF) 100 MCG/2ML IJ SOLN
INTRAMUSCULAR | Status: AC
  Filled 2020-02-01: qty 2

## 2020-02-01 MED ORDER — MIDAZOLAM HCL 2 MG/2ML IJ SOLN
INTRAMUSCULAR | Status: DC | PRN
  Administered 2020-02-01: 1 mg via INTRAVENOUS

## 2020-02-01 MED ORDER — LIDOCAINE HCL (PF) 1 % IJ SOLN
INTRAMUSCULAR | Status: AC
  Filled 2020-02-01: qty 30

## 2020-02-01 SURGICAL SUPPLY — 13 items
CARDIOMEMS PA SENSOR W/DELIVER (Prosthesis & Implant Heart) ×2 IMPLANT
CATH SWAN GANZ 7F STRAIGHT (CATHETERS) ×1 IMPLANT
DILATOR VESSEL 38 20CM 7FR (INTRODUCER) ×1 IMPLANT
DILATOR VESSEL 38 20CM 9FR (INTRODUCER) ×1 IMPLANT
KIT HEART LEFT (KITS) ×2 IMPLANT
PACK CARDIAC CATHETERIZATION (CUSTOM PROCEDURE TRAY) ×2 IMPLANT
SENSOR CARDIOMEMS PA W/DELIVER (Prosthesis & Implant Heart) IMPLANT
SHEATH FAST CATH 12F 12CM (SHEATH) ×1 IMPLANT
SHEATH PROBE COVER 6X72 (BAG) ×1 IMPLANT
TRANSDUCER W/STOPCOCK (MISCELLANEOUS) ×2 IMPLANT
WIRE EMERALD 3MM-J .025X260CM (WIRE) ×1 IMPLANT
WIRE EMERALD 3MM-J .035X150CM (WIRE) ×1 IMPLANT
WIRE NITREX .018X300 STIFF (WIRE) ×1 IMPLANT

## 2020-02-01 NOTE — Progress Notes (Signed)
Up and walked and tolerated well; right groin stable no bleeding or hematoma 

## 2020-02-01 NOTE — Discharge Instructions (Signed)
Restart Eliquis at 8-9 pm tonight.   Cardiomems , Care After This sheet gives you information about how to care for yourself after your procedure. Your health care provider may also give you more specific instructions. If you have problems or questions, contact your health care provider. What can I expect after the procedure? After the procedure, it is common to have bruising and tenderness at the catheter insertion area. Follow these instructions at home: Insertion site care  Follow instructions from your health care provider about how to take care of your insertion site. Make sure you: ? Wash your hands with soap and water before you change your bandage (dressing). If soap and water are not available, use hand sanitizer. ? Change your dressing as told by your health care provider. ? Leave stitches (sutures), skin glue, or adhesive strips in place. These skin closures may need to stay in place for 2 weeks or longer. If adhesive strip edges start to loosen and curl up, you may trim the loose edges. Do not remove adhesive strips completely unless your health care provider tells you to do that.  Do not take baths, swim, or use a hot tub until your health care provider approves.  You may shower 24-48 hours after the procedure or as told by your health care provider. ? Gently wash the site with plain soap and water. ? Pat the area dry with a clean towel. ? Do not rub the site. This may cause bleeding.  Do not apply powder or lotion to the site. Keep the site clean and dry.  Check your insertion site every day for signs of infection. Check for: ? Redness, swelling, or pain. ? Fluid or blood. ? Warmth. ? Pus or a bad smell. Activity  Rest as told by your health care provider, usually for 1-2 days.  Do not lift anything that is heavier than 10 lbs. (4.5 kg) or as told by your health care provider.  Do not drive for 24 hours if you were given a medicine to help you relax (sedative).  Do  not drive or use heavy machinery while taking prescription pain medicine. General instructions   Return to your normal activities as told by your health care provider, usually in about a week. Ask your health care provider what activities are safe for you.  If the catheter site starts bleeding, lie flat and put pressure on the site. If the bleeding does not stop, get help right away. This is a medical emergency.  Drink enough fluid to keep your urine clear or pale yellow. This helps flush the contrast dye from your body.  Take over-the-counter and prescription medicines only as told by your health care provider.  Keep all follow-up visits as told by your health care provider. This is important. Contact a health care provider if:  You have a fever or chills.  You have redness, swelling, or pain around your insertion site.  You have fluid or blood coming from your insertion site.  The insertion site feels warm to the touch.  You have pus or a bad smell coming from your insertion site.  You have bruising around the insertion site.  You notice blood collecting in the tissue around the catheter site (hematoma). The hematoma may be painful to the touch. Get help right away if:  You have severe pain at the catheter insertion area.  The catheter insertion area swells very fast.  The catheter insertion area is bleeding, and the bleeding does not  stop when you hold steady pressure on the area.  The area near or just beyond the catheter insertion site becomes pale, cool, tingly, or numb. These symptoms may represent a serious problem that is an emergency. Do not wait to see if the symptoms will go away. Get medical help right away. Call your local emergency services (911 in the [place] Do not drive yourself to the hospital. Summary  After the procedure, it is common to have bruising and tenderness at the catheter insertion area.  After the procedure, it is important to rest and drink  plenty of fluids.  Do not take baths, swim, or use a hot tub until your health care provider says it is okay to do so. You may shower 24-48 hours after the procedure or as told by your health care provider.  If the catheter site starts bleeding, lie flat and put pressure on the site. If the bleeding does not stop, get help right away. This is a medical emergency. This information is not intended to replace advice given to you by your health care provider. Make sure you discuss any questions you have with your health care provider. Document Revised: 07/12/2017 Document Reviewed: 07/04/2016 Elsevier Patient Education  2020 [employer].

## 2020-02-01 NOTE — Progress Notes (Signed)
Site area: Right groin a 12 french venous sheath was removed  Site Prior to Removal:  Level 0  Pressure Applied For 20 MINUTES    Bedrest Beginning at 0945am  Manual:   Yes  Patient Status During Pull:  stable  Post Pull Groin Site:  Level 0  Post Pull Instructions Given:  Yes.    Post Pull Pulses Present:  Yes.    Dressing Applied:  Yes.    Comments:  Hydralazine 10mg  given for BP 186/77 before pull.  BP    167/75

## 2020-02-01 NOTE — Interval H&P Note (Signed)
History and Physical Interval Note:  02/01/2020 8:20 AM  [name] [name]  has presented today for surgery, with the diagnosis of heart failure.  The various methods of treatment have been discussed with the patient and family. After consideration of risks, benefits and other options for treatment, the patient has consented to  Procedure(s): PRESSURE SENSOR/CARDIOMEMS (N/A) RIGHT HEART CATH (N/A) as a surgical intervention.  The patient's history has been reviewed, patient examined, no change in status, stable for surgery.  I have reviewed the patient's chart and labs.  Questions were answered to the patient's satisfaction.     [name]

## 2020-02-02 ENCOUNTER — Encounter (HOSPITAL_COMMUNITY): Payer: Self-pay | Admitting: Cardiology

## 2020-02-07 NOTE — Progress Notes (Signed)
Patient ID: [name] [name], male   DOB: [birth year], 64 y.o.   MRN: [id number] PCP: [name], MD Cardiology: Dr. Aundra Dubin  64 y.o. with history of HTN, diabetes, paroxysmal atrial fibrillation, and CAD presents for cardiology followup.  He was admitted in 5/13 with chest pain with exertion.  LHC was done showing global HK with EF 45% and diffuse, severe distal and branch vessel disease.  There was no interventional option, but it was suspect that this disease could be causing his anginal-type pain.  Echo at that time was read as showing EF 60-65%.  He was admitted in 5/14 with hypertensive urgency and chest pain.  He ruled out for MI and BP was controlled.  His EF was 50-55% by echo.  ETT-Sestamibi done as outpatient showed small fixed inferior defect with no ischemia but EF was 31%.  He was quite hypertensive during the study.  Cardiac MRI done to confirm EF in 5/14 showed EF 44%.  He had an episode of atrial fibrillation/RVR in 3/15 but went back into NSR.  Echo in 2/16 showed EF 55-60% with moderate diastolic dysfunction.   He had a Cardiolite in 9/16 showing inferior ischemia.  He was having exertional dyspnea with less activity than normal.  There was concern for worsened CAD and he was sent for [facility] in 9/16. By RHC, filling pressures were not significantly elevated. He had a severe distal RCA stenosis that was treated with DES x 2.  Echo showed EF 55-60%.    In 10/16, he was admitted with acute cholecystitis.  He received a cholecystostomy tube.  Cholecystectomy done in 6/17.   Atrial fibrillation in 1/17, cardioverted to NSR.   Recurrent concerning chest pain in 3/17.  Repeat LHC showed diffuse distal and branch vessel disease consistent with poorly-controlled DM, but patent RCA stents.    He was admitted with atrial fibrillation in 12/17.  Started on sotalol and converted to NSR.    He had recurrent chest pain in 5/18 with abnormal Cardiolite.  Cath was done again in 5/18,  showing diffuse CAD with no good interventional options.   Admitted 11/20 with increased dyspnea and exertional chest pain. Hgb went down to 8.1. GI consulted.  Had [place] that showed severe distal and branch vessel disease but no interventional targets and not good anatomy for CABG. Continued on medical management. Capsule endoscopy without recent or active bleeding, no AVMs. He went into atrial flutter during his hospitalization. He went back into NSR, however, after discharge.      He has had episodes of syncope associated with vigorous coughing.   He was in the ER with atrial flutter and dyspnea in 4/21, back in NSR today on sotalol.  Zio patch was placed to assess AFib/AFL burden. Report reviewed today, Atrial Flutter occurred (20% burden), rates ranging from 36-135 bpm (average HR 67). Occasional PVC and couplets and ventricular trigeminy. No sustained VT. 2nd degree AVB was captured w/ rate 33 bpm occurring early morning hours at 1:24 am (? OSA). No pauses > 3 sec detected.  At last visit w/ Dr Aundra Dubin 5/5, he was volume overloaded and SOB w/ exertion. Lasix was increased to 80 qam/40 qpm and  KCl increased to 40 daily.2D echo also repeated and showed normal LVEF 55%. RV normal. Repeat sleep study also ordered and pending.    S/P Cardiomems 02/01/2020 - Reading at that time 24 mm Hg. RHC  RA 5, PA 44/9 (24), PCWP 19, CO 6.23, CI 2.9  Today he returns for HF follow up.Overall feeling fine but has right hand/index finger pain. Thinks he has gout. SOB with inclines. Denies PND/Orthopnea. No bleeding issues. Appetite ok. No fever or chills. Weight at home 202-204 pounds. Taking all medications. He has been taking an extra 40 mg of lasix daily for the last 2 weeks. Having a hard time paying for eliquis.   Labs (5/13): K 3.6, creatinine 0.93, LDL 85, HDL 57 Labs (7/13): K 3.9, creatinine 1.1, LDL 57, HDL 51  Labs (5/14): K 4, creatinine 1.25 Labs (8/14): K 3.7, creatinine 0.9, LDL 103, HDL 42 Labs  (8/15): K 3.5 creatinine 1.0, BNP 75 Labs (2/16): K 3.8, creatinine 1.1 Labs (9/16): K 3.5, creatinine 1.03, BNP 59, HDL 40, LDL 111, TGs 333 Labs (11/16): K 3.6, creaitnine 1.07, LFTs normal, LDL 44 Labs (6/17): K 3.4, creatinine 1.52 => 1.13 Labs (1/18): K 3.9, creatinine 1.25 Labs (6/18): K 3.8, creatinine 1.21, HCT 39.7 Labs (8/18): LDL 39, HDL 43 Labs (12/18): 4.4, creatinine 1.25, LFTs normal Labs (8/19): K 3.6, creatinine 1.13, LDL 68, HDL 49  Labs (11/20): K 3.5, creatinine 1.33, LDL 25 Labs (4/21): K 4.0, SCr 1.14, BNP 266  Labs (5/17): K 4.1, creatinine 1.41  Labs (01/29/2020: K 3.8 Creatinine 1.3   PMH: 1. DM2 2. Gout 3. HTN: Cough with ACEI.  4. Cardiomyopathy: Suspect mixed ischemic/nonischemic (?ETOH-related).  EF 35% in 2008.  Echo in 5/13 showed EF 60-65% with moderate LVH but EF was 45% on LV-gram in 5/13.  Echo (5/14) with EF 50-55%, mild LVH, inferobasal HK, mild MR.  ETT-Sestamibi in 5/14, however, showed EF 31%.  Cardiac MRI (6/14) with EF 44%, mild global hypokinesis, subepicardial delayed enhancement in a nonspecific RV insertion site pattern.  Echo (2/16) with EF 55-60%, moderate diastolic dysfunction. RHC (9/16) with mean RA 8, PA 39/20 mean 28, mean PCWP 14, CI 2.2. Echo (9/16) with EF 55-60%, grade II diastolic dysfunction.  - Echo (6/17) with EF 55-60%, moderate LVH, grade II diastolic dysfunction, mild AI, mild MR.  - RHC (11/20): mean RA 4, PA 46/14 mean 26, mean PCWP 13, CI 3.23 - Echo (9/20): EF 60-65%, moderate LVH, normal RV size and systolic function, mild-moderate MR, mild AI.  5. CAD: LHC in 2010 with mild nonobstructive disease.  LHC (5/13) with diffuse distal and branch vessel disease, mild global hypokinesis and EF 45%.  ETT-Sestamibi (5/14): EF 31%, small fixed inferior defect with no ischemia.  Lexiscan Cardiolite (9/16) with EF 26%, moderate inferior defect that was partially reversible, suggesting ischemia => High risk study. LHC (9/16) with 40-50%  mLAD, diffuse up to 50% distal LAD, 90% ostium of branch off ramus, 95% dRCA => DES to RCA x 2, EF 55-60%.   - LHC (3/17) with diffuse branch and distal vessel disease c/w poorly controlled DM, RCA stents patent.  - LHC (5/18): Diffuse CAD with no good interventional option. 60% mRCA, 90% dLAD, 50% serial mid ramus stenoses, ramus branches with ostial 80-90% stenoses. - LHC (11/20): Severe distal and branch vessel disease but no interventional targets and not good anatomy for CABG  6. H/o chronic HBV 7. Osteochondroma left shoulder.  8. Hyperlipidemia 9. Paroxysmal atrial fibrillation: Coumadin.  Developed cough and increased ESR with amiodarone.  DCCV in 1/17.  Recurrent atrial fibrillation in 12/17, sotalol started.  10. Atrial flutter: had ablations in 2007 and 2008.  11. OSA on CPAP.  12. Acute cholecystitis (10/16): Cholecystostomy tube placed.  Cholecystectomy 6/17.  13. CKD 14.  Anemia - Suspected GI bleed but EGD/c-scope 9/20 without significant findings.  - Capsule Endoscopy (11/20) without recent or active bleeding, no AVMs.  15. Syncope: Suspected cough syncope.   SH: Married, prior smoker.  Some ETOH, occasionally heavy in past.  Does use occasional marijuana. Out of work Engineer, drilling. 2 daughters in grad school.   FH: CAD  ROS: All systems reviewed and negative except as per HPI.   Current Outpatient Medications  Medication Sig Dispense Refill  . ACCU-CHEK SOFTCLIX LANCETS lancets Use as instructed for three times daily testing of blood glucose 100 each 12  . acetaminophen (TYLENOL) 500 MG tablet Take 1,000 mg by mouth every 6 (six) hours as needed (pain).    [name] amLODipine (NORVASC) 5 MG tablet Take 1 tablet by mouth once daily (Patient taking differently: Take 5 mg by mouth daily. ) 90 tablet 3  . atorvastatin (LIPITOR) 80 MG tablet Take 1 tablet (80 mg total) by mouth daily. 90 tablet 1  . Blood Glucose Monitoring Suppl (ACCU-CHEK AVIVA PLUS) w/Device KIT Use as directed for 3  times daily testing of blood glucose 1 kit 0  . carvedilol (COREG) 25 MG tablet TAKE 1 TABLET BY MOUTH TWICE DAILY WITH A MEAL (Patient taking differently: Take 25 mg by mouth 2 (two) times daily with a meal. ) 180 tablet 3  . ELIQUIS 5 MG TABS tablet Take 1 tablet by mouth twice daily (Patient taking differently: Take 5 mg by mouth 2 (two) times daily. ) 60 tablet 3  . furosemide (LASIX) 40 MG tablet Take 2 tablets (80 mg total) by mouth in the morning AND 1.5 tablets (60 mg total) every evening. 105 tablet 6  . glucose blood (ACCU-CHEK AVIVA PLUS) test strip Use as instructed for 3 times daily testing of blood glucose 100 each 12  . guaiFENesin (MUCINEX PO) Take 1 tablet by mouth 2 (two) times daily as needed (cough/congestion).    . hydrALAZINE (APRESOLINE) 50 MG tablet Take 1.5 tablets (75 mg total) by mouth 2 (two) times daily. 90 tablet 3  . insulin glargine (LANTUS SOLOSTAR) 100 UNIT/ML Solostar Pen Inject 35 Units into the skin every morning. And pen needles 1/day 30 mL 3  . isosorbide mononitrate (IMDUR) 120 MG 24 hr tablet Take 1 tablet (120 mg total) by mouth daily. 30 tablet 3  . Lancet Devices (ACCU-CHEK SOFTCLIX) lancets Use as instructed for 3 times daily testing of blood glucose 1 each 0  . metFORMIN (GLUCOPHAGE) 1000 MG tablet Take 1 tablet (1,000 mg total) by mouth 2 (two) times daily with a meal. 180 tablet 1  . nitroGLYCERIN (NITROSTAT) 0.4 MG SL tablet Place 1 tablet (0.4 mg total) under the tongue every 5 (five) minutes as needed for chest pain (x 3 tabs daily). 60 tablet 1  . potassium chloride SA (KLOR-CON) 20 MEQ tablet Take 3 tablets (60 mEq total) by mouth daily. 90 tablet 6  . sotalol (BETAPACE) 120 MG tablet Take 1 tablet by mouth twice daily (Patient taking differently: Take 120 mg by mouth 2 (two) times daily. ) 180 tablet 3  . TRUEPLUS LANCETS 28G MISC 1 each by Does not apply route 3 (three) times daily. 100 each 12   No current facility-administered medications for  this encounter.    BP 136/70 (BP Location: Left Arm, Patient Position: Sitting)   Pulse 78   Ht 6' (1.829 m)   Wt 91.9 kg (202 lb 9.6 oz)   SpO2 97%   BMI 27.48 kg/m  Wt Readings from Last 3 Encounters:  02/08/20 91.9 kg (202 lb 9.6 oz)  02/01/20 92.1 kg (203 lb)  01/29/20 93 kg (205 lb)   PHYSICAL EXAM: General:  Well appearing. No resp difficulty HEENT: normal Neck: supple. no JVD. Carotids 2+ bilat; no bruits. No lymphadenopathy or thryomegaly appreciated. Cor: PMI nondisplaced. Regular rate & rhythm. No rubs, gallops or murmurs. Lungs: clear Abdomen: soft, nontender, nondistended. No hepatosplenomegaly. No bruits or masses. Good bowel sounds. Extremities: no cyanosis, clubbing, rash, edema. R index finger tender.  Neuro: alert & orientedx3, cranial nerves grossly intact. moves all 4 extremities w/o difficulty. Affect pleasant  Assessment/Plan:  1. Chronic diastolic CHF: EF 33% on cardiac MRI in 6/14 but EF improved back to 55-60% on 2/16 echo.  However, EF down to 26% on Cardiolite in 9/16.  Echo in 9/20 showed EF 60-65%, moderate LVH, normal RV, mild-moderate MR. Echo repeated 5/21, LVEF 55%. RV normal. -S/P Cardiomems 02/01/20 with initial reading PAD 24 , todays reading 24. Will set goal after 30 days.  - NYHA III. Volume status stable. Continue lasix 80 mg/60 mg.  - Check BMET today.   2. Atrial fibrillation/flutter: Paroxysmal. He has had atrial flutter ablations x 2 remotely.  He is on sotalol.  QTc ok on ECG today. He tolerates atrial arrhythmias poorly, seem to worsen CHF.  He was in atypical flutter last month, now back in NSR.  Zio patch 5/21 showed afib/flutter burden = 20%.  - Continue sotalol.  - Suspect untreated OSA may be contributing. Home sleep study has been ordered.  - Continue Eliquis.   - He has been referred to EP for evaluation for repeat ablation (flutter versus fibrillation/flutter) . 3. Coronary artery disease: LHC in 9/16 showed diffuse CAD, most  significant involving the distal RCA.  Patient had DES x 2 to RCA.  Cath in 3/17 with patent RCA stents and diffuse distal and branch vessel disease concerning for poorly controlled diabetes.  Cath in 5/18 again showed diffuse CAD without good interventional target.  Cath in 11/20 showed severe distal and branch vessel disease but no interventional targets and not good anatomy for CABG.   - No chest pain - No ASA given Eliquis use.  - Continue atorvastatin 80 mg daily, good lipids 06/2019  - Continue Coreg, amlodipine, and Imdur 120 mg daily for angina control.  - Avoid ranolazine with sotalol use. Refill sotalol today.   4. Hyperlipidemia:  On atorvastatin, good lipids 06/2019  5. Hypertension: historically controlled w/ medications.  BP mildly elevated today in the setting of fluid overload. Increase lasix per above  6. OSA: not on CPAP. Did not tolerate previously. We discussed CV affects of untreated OSA including potential for worsening HF and recurrent atrial arrhthymias. He is willing to retry CPAP.  Sleep study is going to be set up. He is waiting on delivery.   7. CKD: BMET today.  8. Syncope: Recent Zio patch showed no significant arrhthymias other than paroxsymal atrial flutter. Max HR 135. 2nd degree AVB was captured w/ rate 33 bpm occurring early morning hours at 1:24 am (? OSA). No pauses > 3 sec detected.  Recent Echo w/ normal LVEF. No significant valvular dysfunction 9. Gout, acute flare - Check uric acid Give prednisone 40 mg daily x 3 days.   Follow up in 3 months. Discussed cardiomems readings. Plan to set goal in 30 days.   Darrick Grinder, NP-C 02/08/2020

## 2020-02-08 ENCOUNTER — Encounter (HOSPITAL_COMMUNITY): Payer: Self-pay

## 2020-02-08 ENCOUNTER — Other Ambulatory Visit: Payer: Self-pay

## 2020-02-08 ENCOUNTER — Ambulatory Visit (HOSPITAL_COMMUNITY)
Admission: RE | Admit: 2020-02-08 | Discharge: 2020-02-08 | Disposition: A | Discharge status: Home / Self Care | Payer: Medicare Other | Source: Ambulatory Visit | Attending: Adult Health | Admitting: Adult Health

## 2020-02-08 VITALS — BP 136/70 | HR 78 | Ht 72.0 in | Wt 202.6 lb

## 2020-02-08 DIAGNOSIS — Z7901 Long term (current) use of anticoagulants: Secondary | ICD-10-CM | POA: Insufficient documentation

## 2020-02-08 DIAGNOSIS — Z8249 Family history of ischemic heart disease and other diseases of the circulatory system: Secondary | ICD-10-CM | POA: Insufficient documentation

## 2020-02-08 DIAGNOSIS — I428 Other cardiomyopathies: Secondary | ICD-10-CM | POA: Insufficient documentation

## 2020-02-08 DIAGNOSIS — I480 Paroxysmal atrial fibrillation: Secondary | ICD-10-CM | POA: Diagnosis not present

## 2020-02-08 DIAGNOSIS — M10041 Idiopathic gout, right hand: Secondary | ICD-10-CM

## 2020-02-08 DIAGNOSIS — I5032 Chronic diastolic (congestive) heart failure: Secondary | ICD-10-CM | POA: Insufficient documentation

## 2020-02-08 DIAGNOSIS — E1122 Type 2 diabetes mellitus with diabetic chronic kidney disease: Secondary | ICD-10-CM | POA: Diagnosis not present

## 2020-02-08 DIAGNOSIS — I4819 Other persistent atrial fibrillation: Secondary | ICD-10-CM | POA: Diagnosis not present

## 2020-02-08 DIAGNOSIS — I130 Hypertensive heart and chronic kidney disease with heart failure and stage 1 through stage 4 chronic kidney disease, or unspecified chronic kidney disease: Secondary | ICD-10-CM | POA: Diagnosis not present

## 2020-02-08 DIAGNOSIS — M109 Gout, unspecified: Secondary | ICD-10-CM | POA: Diagnosis not present

## 2020-02-08 DIAGNOSIS — R55 Syncope and collapse: Secondary | ICD-10-CM | POA: Diagnosis not present

## 2020-02-08 DIAGNOSIS — I2510 Atherosclerotic heart disease of native coronary artery without angina pectoris: Secondary | ICD-10-CM | POA: Diagnosis not present

## 2020-02-08 DIAGNOSIS — E785 Hyperlipidemia, unspecified: Secondary | ICD-10-CM | POA: Insufficient documentation

## 2020-02-08 DIAGNOSIS — G4733 Obstructive sleep apnea (adult) (pediatric): Secondary | ICD-10-CM | POA: Diagnosis not present

## 2020-02-08 DIAGNOSIS — Z794 Long term (current) use of insulin: Secondary | ICD-10-CM | POA: Insufficient documentation

## 2020-02-08 DIAGNOSIS — I4892 Unspecified atrial flutter: Secondary | ICD-10-CM | POA: Insufficient documentation

## 2020-02-08 DIAGNOSIS — Z79899 Other long term (current) drug therapy: Secondary | ICD-10-CM | POA: Insufficient documentation

## 2020-02-08 DIAGNOSIS — Z955 Presence of coronary angioplasty implant and graft: Secondary | ICD-10-CM | POA: Diagnosis not present

## 2020-02-08 DIAGNOSIS — N1831 Chronic kidney disease, stage 3a: Secondary | ICD-10-CM

## 2020-02-08 DIAGNOSIS — N189 Chronic kidney disease, unspecified: Secondary | ICD-10-CM | POA: Insufficient documentation

## 2020-02-08 DIAGNOSIS — Z87891 Personal history of nicotine dependence: Secondary | ICD-10-CM | POA: Insufficient documentation

## 2020-02-08 LAB — BASIC METABOLIC PANEL
Anion gap: 12 (ref 5–15)
BUN: 16 mg/dL (ref 8–23)
CO2: 23 mmol/L (ref 22–32)
Calcium: 9 mg/dL (ref 8.9–10.3)
Chloride: 101 mmol/L (ref 98–111)
Creatinine, Ser: 1.33 mg/dL — ABNORMAL HIGH (ref 0.61–1.24)
GFR calc Af Amer: >60 mL/min (ref >60)
GFR calc non Af Amer: 56 mL/min — ABNORMAL LOW (ref >60)
Glucose, Bld: 231 mg/dL — ABNORMAL HIGH (ref 70–99)
Potassium: 3.1 mmol/L — ABNORMAL LOW (ref 3.5–5.1)
Sodium: 136 mmol/L (ref 135–145)

## 2020-02-08 LAB — URIC ACID: Uric Acid, Serum: 9.9 mg/dL — ABNORMAL HIGH (ref 3.7–8.6)

## 2020-02-08 MED ORDER — APIXABAN 5 MG PO TABS: 5.0000 mg | ORAL_TABLET | Freq: Two times a day (BID) | ORAL | 3 refills | Status: DC

## 2020-02-08 MED ORDER — PREDNISONE 20 MG PO TABS
40.0000 mg | ORAL_TABLET | Freq: Every day | ORAL | 0 refills | Status: AC
Start: 2020-02-08 — End: 2020-02-11

## 2020-02-08 MED ORDER — SOTALOL HCL 120 MG PO TABS: 120.0000 mg | ORAL_TABLET | Freq: Two times a day (BID) | ORAL | 3 refills | Status: DC

## 2020-02-08 NOTE — Progress Notes (Signed)
Medication Samples have been provided to the patient.  Drug name: eliquis       Strength: 5 mg        Qty:28  LOT: [id number]  Exp.Date: 03/2022  Dosing instructions: one tab twice daily  The patient has been instructed regarding the correct time, dose, and frequency of taking this medication, including desired effects and most common side effects.   [name] [name] 11:11 AM 02/08/2020

## 2020-02-08 NOTE — Patient Instructions (Signed)
START Prednisone 40 mg daily for 3 days  Labs today We will only contact you if something comes back abnormal or we need to make some changes. Otherwise no news is good news!  Your physician recommends that you schedule a follow-up appointment in: 8-10 weeks  Do the following things EVERYDAY: 1) Weigh yourself in the morning before breakfast. Write it down and keep it in a log. 2) Take your medicines as prescribed 3) Eat low salt foods--Limit salt (sodium) to 2000 mg per day.  4) Stay as active as you can everyday 5) Limit all fluids for the day to less than 2 liters  At the [facility], you and your health needs are our priority. As part of our continuing mission to provide you with exceptional heart care, we have created designated Provider Care Teams. These Care Teams include your primary Cardiologist (physician) and Advanced Practice Providers (APPs- Physician Assistants and Nurse Practitioners) who all work together to provide you with the care you need, when you need it.   You may see any of the following providers on your designated Care Team at your next follow up: [name] Dr Glori Bickers . Dr Loralie Champagne . Darrick Grinder, NP . Lyda Jester, PA . Audry Riles, PharmD   Please be sure to bring in all your medications bottles to every appointment.   [name]

## 2020-02-09 ENCOUNTER — Other Ambulatory Visit: Payer: Self-pay

## 2020-02-09 ENCOUNTER — Ambulatory Visit (INDEPENDENT_AMBULATORY_CARE_PROVIDER_SITE_OTHER): Payer: Medicare Other | Admitting: Internal Medicine

## 2020-02-09 ENCOUNTER — Encounter: Payer: Self-pay | Admitting: Internal Medicine

## 2020-02-09 VITALS — BP 126/76 | HR 93 | Ht 72.0 in | Wt 202.0 lb

## 2020-02-09 DIAGNOSIS — I4819 Other persistent atrial fibrillation: Secondary | ICD-10-CM

## 2020-02-09 DIAGNOSIS — R55 Syncope and collapse: Secondary | ICD-10-CM | POA: Diagnosis not present

## 2020-02-09 NOTE — Progress Notes (Signed)
HPI [name] is referred today after a long absence from our EP clinic. He underwent catheter ablation of atrial flutter in 2007. He developed atrial fib in 2016 and was treated with sotalol and has at times had atypical atrial flutter. He has improvement in his LV function. He recently had a cardiomims device placed due to volume overload. He denies chest pain. He notes that when he gets up quickly he will get a little dizzy.  Allergies  Allergen Reactions  . Shrimp [Shellfish Allergy] Nausea And Vomiting  . Ace Inhibitors Cough  . Other Hives and Other (See Comments)    Patient reports developing hives after receiving "some antibiotic given in 1980''s at [facility]". He does not know which antibiotic.     Current Outpatient Medications  Medication Sig Dispense Refill  . ACCU-CHEK SOFTCLIX LANCETS lancets Use as instructed for three times daily testing of blood glucose 100 each 12  . acetaminophen (TYLENOL) 500 MG tablet Take 1,000 mg by mouth every 6 (six) hours as needed (pain).    [name] amLODipine (NORVASC) 5 MG tablet Take 1 tablet by mouth once daily 90 tablet 3  . apixaban (ELIQUIS) 5 MG TABS tablet Take 1 tablet (5 mg total) by mouth 2 (two) times daily. 60 tablet 3  . atorvastatin (LIPITOR) 80 MG tablet Take 1 tablet (80 mg total) by mouth daily. 90 tablet 1  . Blood Glucose Monitoring Suppl (ACCU-CHEK AVIVA PLUS) w/Device KIT Use as directed for 3 times daily testing of blood glucose 1 kit 0  . carvedilol (COREG) 25 MG tablet TAKE 1 TABLET BY MOUTH TWICE DAILY WITH A MEAL 180 tablet 3  . furosemide (LASIX) 40 MG tablet Take 2 tablets (80 mg total) by mouth in the morning AND 1.5 tablets (60 mg total) every evening. 105 tablet 6  . glucose blood (ACCU-CHEK AVIVA PLUS) test strip Use as instructed for 3 times daily testing of blood glucose 100 each 12  . guaiFENesin (MUCINEX PO) Take 1 tablet by mouth 2 (two) times daily as needed (cough/congestion).    .  hydrALAZINE (APRESOLINE) 50 MG tablet Take 1.5 tablets (75 mg total) by mouth 2 (two) times daily. 90 tablet 3  . insulin glargine (LANTUS SOLOSTAR) 100 UNIT/ML Solostar Pen Inject 35 Units into the skin every morning. And pen needles 1/day 30 mL 3  . isosorbide mononitrate (IMDUR) 120 MG 24 hr tablet Take 1 tablet (120 mg total) by mouth daily. 30 tablet 3  . Lancet Devices (ACCU-CHEK SOFTCLIX) lancets Use as instructed for 3 times daily testing of blood glucose 1 each 0  . metFORMIN (GLUCOPHAGE) 1000 MG tablet Take 1 tablet (1,000 mg total) by mouth 2 (two) times daily with a meal. 180 tablet 1  . nitroGLYCERIN (NITROSTAT) 0.4 MG SL tablet Place 1 tablet (0.4 mg total) under the tongue every 5 (five) minutes as needed for chest pain (x 3 tabs daily). 60 tablet 1  . potassium chloride SA (KLOR-CON) 20 MEQ tablet Take 3 tablets (60 mEq total) by mouth daily. 90 tablet 6  . predniSONE (DELTASONE) 20 MG tablet Take 2 tablets (40 mg total) by mouth daily for 3 days. 6 tablet 0  . sotalol (BETAPACE) 120 MG tablet Take 1 tablet (120 mg total) by mouth 2 (two) times daily. 180 tablet 3  . TRUEPLUS LANCETS 28G MISC 1 each by Does not apply route 3 (three) times daily. 100 each 12   No current facility-administered medications  for this visit.     Past Medical History:  Diagnosis Date  . Anginal pain ([place])   . Anxiety   . Arrhythmia   . CAD (coronary artery disease), native coronary artery    a. Nonobstructive CAD by cath 2013 - diffuse distal and branch vessel CAD, no severe disease in the major coronaries, LV mild global hypokinesis, EF 45%. b. ETT-Sestamibi 5/14: EF 31%, small fixed inferior defect with no ischemia.  . Cholecystitis   . Chronic CHF ([place])    a. Mixed ICM/NICM (?EtOH). EF 35% in 2008. Echo 5/13: EF 60-65%, mod LVH, EF 45% on V gram in 12/2011. EF 12/2012: EF 50-55%, mild LVH, inferobasal HK, mild MR. ETT-Ses 5/14 EF 41%. Cardiac MRI 5/14: EF 44%, mild global HK, subepicardial delayed  enhancement in nonspecific RV insertion pattern.  . COLONIC POLYPS, HX OF 12/30/2006  . Gout   . H/O atrial flutter 2007   a. Ablations in 2007, 2008.  [name] Heart murmur   . HEPATITIS B, CHRONIC 12/30/2006  . History of alcohol abuse   . History of hiatal hernia   . History of medication noncompliance   . History of nuclear stress test    Myoview 3/17: + chest pain; EF 33%, downsloping ST depression 2, V4-6, LVH with strain, no ischemia on images; intermediate risk   . HYPERCHOLESTEROLEMIA 07/11/2010  . Hypertension   . Left sciatic nerve pain since 04/2015  . LIVER FUNCTION TESTS, ABNORMAL, HX OF 12/30/2006  . MITRAL REGURGITATION 12/30/2006  . 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 PAF (paroxysmal atrial fibrillation) (H[facility])    On coumadin  . Rectal bleeding 12/18/2011   Scheduled for colonoscopy.    . Shortness of breath dyspnea    with excertion  . Sleep apnea    "suppose to send mask but they never did" (05/03/2015)  . Type II diabetes mellitus ([facility])     ROS:   All systems reviewed and negative except as noted in the HPI.   Past Surgical History:  Procedure Laterality Date  . A FLUTTER ABLATION  2007, 2008   catheter ablation   . CARDIAC CATHETERIZATION N/A 05/03/2015   Procedure: Right/Left Heart Cath and Coronary Angiography;  Surgeon: Larey Dresser, MD;  Location: [place] [facility];  Service: Cardiovascular;  Laterality: N/A;  . CARDIAC CATHETERIZATION N/A 05/03/2015   Procedure: Coronary Stent Intervention;  Surgeon: Jettie Booze, MD;  Location: [place] [facility];  Service: Cardiovascular;  Laterality: N/A;  . CARDIAC CATHETERIZATION N/A 11/04/2015   Procedure: Left Heart Cath and Coronary Angiography;  Surgeon: Larey Dresser, MD;  Location: [place] [facility];  Service: Cardiovascular;  Laterality: N/A;  . CARDIOVERSION N/A 08/24/2015   Procedure: CARDIOVERSION;  Surgeon: Thayer Headings, MD;  Location: [facility] ENDOSCOPY;  Service: Cardiovascular;   Laterality: N/A;  . CHOLECYSTECTOMY N/A 02/07/2016   Procedure: LAPAROSCOPIC CHOLECYSTECTOMY WITH  INTRAOPERATIVE CHOLANGIOGRAM;  Surgeon: Greer Pickerel, MD;  Location: [facility];  Service: General;  Laterality: N/A;  . COLONOSCOPY WITH PROPOFOL N/A 04/28/2019   Procedure: COLONOSCOPY WITH PROPOFOL;  Surgeon: Ronnette Juniper, MD;  Location: [place];  Service: Gastroenterology;  Laterality: N/A;  . CORONARY ANGIOPLASTY  12/05/01  . ESOPHAGOGASTRODUODENOSCOPY (EGD) WITH PROPOFOL N/A 04/27/2019   Procedure: ESOPHAGOGASTRODUODENOSCOPY (EGD) WITH PROPOFOL;  Surgeon: Ronnette Juniper, MD;  Location: [place];  Service: Gastroenterology;  Laterality: N/A;  . GIVENS CAPSULE STUDY N/A 06/29/2019   Procedure: GIVENS CAPSULE STUDY;  Surgeon: Wilford Corner, MD;  Location: [facility] ENDOSCOPY;  Service: Endoscopy;  Laterality: N/A;  . LEFT HEART CATH AND CORONARY ANGIOGRAPHY N/A 12/28/2016   Procedure: Left Heart Cath and Coronary Angiography;  Surgeon: Larey Dresser, MD;  Location: [place] [facility];  Service: Cardiovascular;  Laterality: N/A;  . LEFT HEART CATHETERIZATION WITH CORONARY ANGIOGRAM N/A 12/19/2011   Procedure: LEFT HEART CATHETERIZATION WITH CORONARY ANGIOGRAM;  Surgeon: Larey Dresser, MD;  Location: [facility] CATH LAB;  Service: Cardiovascular;  Laterality: N/A;  . OSTEOCHONDROMA EXCISION Left 1972   "took bone tumor off my shoulder"  . POLYPECTOMY  04/28/2019   Procedure: POLYPECTOMY;  Surgeon: Ronnette Juniper, MD;  Location: [facility] ENDOSCOPY;  Service: Gastroenterology;;  . PRESSURE SENSOR/CARDIOMEMS N/A 02/01/2020   Procedure: PRESSURE SENSOR/CARDIOMEMS;  Surgeon: Larey Dresser, MD;  Location: [place] [facility];  Service: Cardiovascular;  Laterality: N/A;  . RIGHT HEART CATH N/A 02/01/2020   Procedure: RIGHT HEART CATH;  Surgeon: Larey Dresser, MD;  Location: [place] [facility];  Service: Cardiovascular;  Laterality: N/A;  . RIGHT/LEFT HEART CATH AND CORONARY ANGIOGRAPHY N/A 07/01/2019   Procedure:  RIGHT/LEFT HEART CATH AND CORONARY ANGIOGRAPHY;  Surgeon: Larey Dresser, MD;  Location: [place] [facility];  Service: Cardiovascular;  Laterality: N/A;     Family History  Problem Relation Age of Onset  . Diabetes Mother   . Hypertension Mother   . Heart attack Neg Hx   . Stroke Neg Hx      Social History   Socioeconomic History  . Marital status: Widowed    Spouse name: Not on file  . Number of children: 2  . Years of education: 39  . Highest education level: Some college, no degree  Occupational History    Employer: UNEMPLOYED  Tobacco Use  . Smoking status: Former Smoker    Packs/day: 4.00    Years: 18.00    Pack years: 72.00    Types: Cigarettes    Quit date: 08/14/1983    Years since quitting: 36.5  . Smokeless tobacco: Never Used  Vaping Use  . Vaping Use: Never used  Substance and Sexual Activity  . Alcohol use: Yes    Alcohol/week: 16.0 standard drinks    Types: 6 Cans of beer, 10 Shots of liquor per week    Comment: occasionally  . Drug use: Yes    Types: Marijuana    Comment: twice a week  . Sexual activity: Never  Other Topics Concern  . Not on file  Social History Narrative  . Not on file   Social Determinants of Health   Financial Resource Strain:   . Difficulty of Paying Living Expenses:   Food Insecurity:   . Worried About Charity fundraiser in the Last Year:   . Arboriculturist in the Last Year:   Transportation Needs:   . Film/video editor (Medical):   [name] Lack of Transportation (Non-Medical):   Physical Activity:   . Days of Exercise per Week:   . Minutes of Exercise per Session:   Stress:   . Feeling of Stress :   Social Connections:   . Frequency of Communication with Friends and Family:   . Frequency of Social Gatherings with Friends and Family:   . Attends Religious Services:   . Active Member of Clubs or Organizations:   . Attends Archivist Meetings:   [name] Marital Status:   Intimate Partner Violence:   . Fear  of Current or Ex-Partner:   .  Emotionally Abused:   [name] Physically Abused:   . Sexually Abused:      BP 126/76   Pulse 93   Ht 6' (1.829 m)   Wt 202 lb (91.6 kg)   SpO2 96%   BMI 27.40 kg/m   Physical Exam:  Well appearing 64 yo man, NAD HEENT: Unremarkable Neck:  6 cm JVD, no thyromegally Lymphatics:  No adenopathy Back:  No CVA tenderness Lungs:  Clear with no wheezes HEART:  Regular rate rhythm, no murmurs, no rubs, no clicks Abd:  soft, positive bowel sounds, no organomegally, no rebound, no guarding Ext:  2 plus pulses, no edema, no cyanosis, no clubbing Skin:  No rashes no nodules Neuro:  CN II through XII intact, motor grossly intact  EKG - nsr with LVH   Assess/Plan: 1. Typical atrial flutter - he is s/p EPS/RFA and has had no recurernt symptoms.  2. PAF - he appears to be maintaining mostly NSR on sotalol and systemic 3. LA flutter - he has some of this. No change in therapy with sotalol.  4. Chronic systolic/diastolic heart failure -he appears euvolemic on exam today. He wiil continue his current meds.  Mikle Bosworth.D.

## 2020-02-09 NOTE — Patient Instructions (Addendum)
Medication Instructions:  Your physician recommends that you continue on your current medications as directed. Please refer to the Current Medication list given to you today.  Labwork: None ordered.  Testing/Procedures: None ordered.  Follow-Up: Your physician wants you to follow-up in: as needed with Dr. Taylor.      Any Other Special Instructions Will Be Listed Below (If Applicable).  If you need a refill on your cardiac medications before your next appointment, please call your pharmacy.   

## 2020-02-12 ENCOUNTER — Telehealth (HOSPITAL_COMMUNITY): Payer: Self-pay | Admitting: Cardiology

## 2020-02-12 DIAGNOSIS — I5032 Chronic diastolic (congestive) heart failure: Secondary | ICD-10-CM

## 2020-02-12 NOTE — Telephone Encounter (Signed)
-----   Message from Conrad Livingston, NP sent at 02/08/2020 12:54 PM EDT ----- Uric Acid elevated. Started on prednisone x 3 days. K low. Will need to take extra 40 meq of potassium daily. Repeat BMEt in 7 days.

## 2020-02-12 NOTE — Telephone Encounter (Signed)
Patient reports he has not been taking potassium at all.advised to restart at current dose and will recheck labs 7/9

## 2020-02-13 ENCOUNTER — Other Ambulatory Visit (HOSPITAL_COMMUNITY): Payer: Self-pay | Admitting: Cardiology

## 2020-02-13 DIAGNOSIS — E785 Hyperlipidemia, unspecified: Secondary | ICD-10-CM

## 2020-02-16 NOTE — Telephone Encounter (Signed)
Called patient's pharmacy and the co-pay on his Eliquis was $0. No issues at this time.  Charlann Boxer, CPhT

## 2020-02-19 ENCOUNTER — Other Ambulatory Visit (HOSPITAL_COMMUNITY): Payer: Medicare Other

## 2020-02-26 ENCOUNTER — Telehealth: Payer: Self-pay

## 2020-02-26 NOTE — Telephone Encounter (Signed)
Pt called stating someone called but I did not see a phone note to see who called him. I asked the scheduler ashland did she called him today and she states no. He hung up before I could tell him I did not know who called him.

## 2020-03-01 ENCOUNTER — Other Ambulatory Visit: Payer: Self-pay | Admitting: Family Medicine

## 2020-03-01 ENCOUNTER — Other Ambulatory Visit (HOSPITAL_COMMUNITY): Payer: Self-pay | Admitting: Internal Medicine

## 2020-03-01 DIAGNOSIS — E1169 Type 2 diabetes mellitus with other specified complication: Secondary | ICD-10-CM

## 2020-03-01 DIAGNOSIS — Z794 Long term (current) use of insulin: Secondary | ICD-10-CM

## 2020-03-03 NOTE — Telephone Encounter (Signed)
Pt needs appointment for further refills 

## 2020-04-04 NOTE — Progress Notes (Signed)
Patient ID: [name] [name], male   DOB: March 03, 1956, 64 y.o.   MRN: [id number] PCP: [name], MD Cardiology: Dr. Aundra Dubin  64 y.o. with history of HTN, diabetes, paroxysmal atrial fibrillation, and CAD presents for cardiology followup.  He was admitted in 5/13 with chest pain with exertion.  LHC was done showing global HK with EF 45% and diffuse, severe distal and branch vessel disease.  There was no interventional option, but it was suspect that this disease could be causing his anginal-type pain.  Echo at that time was read as showing EF 60-65%.  He was admitted in 5/14 with hypertensive urgency and chest pain.  He ruled out for MI and BP was controlled.  His EF was 50-55% by echo.  ETT-Sestamibi done as outpatient showed small fixed inferior defect with no ischemia but EF was 31%.  He was quite hypertensive during the study.  Cardiac MRI done to confirm EF in 5/14 showed EF 44%.  He had an episode of atrial fibrillation/RVR in 3/15 but went back into NSR.  Echo in 2/16 showed EF 55-60% with moderate diastolic dysfunction.   He had a Cardiolite in 9/16 showing inferior ischemia.  He was having exertional dyspnea with less activity than normal.  There was concern for worsened CAD and he was sent for [facility] in 9/16. By RHC, filling pressures were not significantly elevated. He had a severe distal RCA stenosis that was treated with DES x 2.  Echo showed EF 55-60%.    In 10/16, he was admitted with acute cholecystitis.  He received a cholecystostomy tube.  Cholecystectomy done in 6/17.   Atrial fibrillation in 1/17, cardioverted to NSR.   Recurrent concerning chest pain in 3/17.  Repeat LHC showed diffuse distal and branch vessel disease consistent with poorly-controlled DM, but patent RCA stents.    He was admitted with atrial fibrillation in 12/17.  Started on sotalol and converted to NSR.    He had recurrent chest pain in 5/18 with abnormal Cardiolite.  Cath was done again in 5/18,  showing diffuse CAD with no good interventional options.   Admitted 11/20 with increased dyspnea and exertional chest pain. Hgb went down to 8.1. GI consulted.  Had [place] that showed severe distal and branch vessel disease but no interventional targets and not good anatomy for CABG. Continued on medical management. Capsule endoscopy without recent or active bleeding, no AVMs. He went into atrial flutter during his hospitalization. He went back into NSR, however, after discharge.      He has had episodes of syncope associated with vigorous coughing.   He was in the ER with atrial flutter and dyspnea in 4/21, back in NSR today on sotalol.  Zio patch was placed to assess AFib/AFL burden. Report reviewed today, Atrial Flutter occurred (20% burden), rates ranging from 36-135 bpm (average HR 67). Occasional PVC and couplets and ventricular trigeminy. No sustained VT. 2nd degree AVB was captured w/ rate 33 bpm occurring early morning hours at 1:24 am (? OSA). No pauses > 3 sec detected.  At last visit w/ Dr Aundra Dubin 5/5, he was volume overloaded and SOB w/ exertion. Lasix was increased to 80 qam/40 qpm and  KCl increased to 40 daily.2D echo also repeated and showed normal LVEF 55%. RV normal. Repeat sleep study also ordered and pending.    S/P Cardiomems 02/01/2020 - Reading at that time 24 mm Hg. RHC  RA 5, PA 44/9 (24), PCWP 19, CO 6.23, CI 2.9  Last reading was 03/26/20 --->24, stable.   Today he returns for HF follow up. Overall feeling fair. Complaining cough. Denies chills. Mild SOB with exertion.  Sleeps on 2 pillows. Denies PND/Orthopnea. Eating fast food every day. Appetite ok. No fever or chills. Rarely weighs. Rarely checks blood sugar. Taking all medications. Retired. Lives alone.   Labs (5/13): K 3.6, creatinine 0.93, LDL 85, HDL 57 Labs (7/13): K 3.9, creatinine 1.1, LDL 57, HDL 51  Labs (5/14): K 4, creatinine 1.25 Labs (8/14): K 3.7, creatinine 0.9, LDL 103, HDL 42 Labs (8/15): K 3.5  creatinine 1.0, BNP 75 Labs (2/16): K 3.8, creatinine 1.1 Labs (9/16): K 3.5, creatinine 1.03, BNP 59, HDL 40, LDL 111, TGs 333 Labs (11/16): K 3.6, creaitnine 1.07, LFTs normal, LDL 44 Labs (6/17): K 3.4, creatinine 1.52 => 1.13 Labs (1/18): K 3.9, creatinine 1.25 Labs (6/18): K 3.8, creatinine 1.21, HCT 39.7 Labs (8/18): LDL 39, HDL 43 Labs (12/18): 4.4, creatinine 1.25, LFTs normal Labs (8/19): K 3.6, creatinine 1.13, LDL 68, HDL 49  Labs (11/20): K 3.5, creatinine 1.33, LDL 25 Labs (4/21): K 4.0, SCr 1.14, BNP 266  Labs (5/17): K 4.1, creatinine 1.41  Labs (01/29/2020: K 3.8 Creatinine 1.3   PMH: 1. DM2 2. Gout 3. HTN: Cough with ACEI.  4. Cardiomyopathy: Suspect mixed ischemic/nonischemic (?ETOH-related).  EF 35% in 2008.  Echo in 5/13 showed EF 60-65% with moderate LVH but EF was 45% on LV-gram in 5/13.  Echo (5/14) with EF 50-55%, mild LVH, inferobasal HK, mild MR.  ETT-Sestamibi in 5/14, however, showed EF 31%.  Cardiac MRI (6/14) with EF 44%, mild global hypokinesis, subepicardial delayed enhancement in a nonspecific RV insertion site pattern.  Echo (2/16) with EF 55-60%, moderate diastolic dysfunction. RHC (9/16) with mean RA 8, PA 39/20 mean 28, mean PCWP 14, CI 2.2. Echo (9/16) with EF 55-60%, grade II diastolic dysfunction.  - Echo (6/17) with EF 55-60%, moderate LVH, grade II diastolic dysfunction, mild AI, mild MR.  - RHC (11/20): mean RA 4, PA 46/14 mean 26, mean PCWP 13, CI 3.23 - Echo (9/20): EF 60-65%, moderate LVH, normal RV size and systolic function, mild-moderate MR, mild AI.  5. CAD: LHC in 2010 with mild nonobstructive disease.  LHC (5/13) with diffuse distal and branch vessel disease, mild global hypokinesis and EF 45%.  ETT-Sestamibi (5/14): EF 31%, small fixed inferior defect with no ischemia.  Lexiscan Cardiolite (9/16) with EF 26%, moderate inferior defect that was partially reversible, suggesting ischemia => High risk study. LHC (9/16) with 40-50% mLAD, diffuse up  to 50% distal LAD, 90% ostium of branch off ramus, 95% dRCA => DES to RCA x 2, EF 55-60%.   - LHC (3/17) with diffuse branch and distal vessel disease c/w poorly controlled DM, RCA stents patent.  - LHC (5/18): Diffuse CAD with no good interventional option. 60% mRCA, 90% dLAD, 50% serial mid ramus stenoses, ramus branches with ostial 80-90% stenoses. - LHC (11/20): Severe distal and branch vessel disease but no interventional targets and not good anatomy for CABG  6. H/o chronic HBV 7. Osteochondroma left shoulder.  8. Hyperlipidemia 9. Paroxysmal atrial fibrillation: Coumadin.  Developed cough and increased ESR with amiodarone.  DCCV in 1/17.  Recurrent atrial fibrillation in 12/17, sotalol started.  10. Atrial flutter: had ablations in 2007 and 2008.  11. OSA on CPAP.  12. Acute cholecystitis (10/16): Cholecystostomy tube placed.  Cholecystectomy 6/17.  13. CKD 14. Anemia - Suspected GI bleed but EGD/c-scope 9/20   without significant findings.  - Capsule Endoscopy (11/20) without recent or active bleeding, no AVMs.  15. Syncope: Suspected cough syncope.   SH: Married, prior smoker.  Some ETOH, occasionally heavy in past.  Does use occasional marijuana. 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   FH: CAD  ROS: All systems reviewed and negative except as per HPI.   Current Outpatient Medications  Medication Sig Dispense Refill  . ACCU-CHEK SOFTCLIX LANCETS lancets Use as instructed for three times daily testing of blood glucose 100 each 12  . acetaminophen (TYLENOL) 500 MG tablet Take 1,000 mg by mouth every 6 (six) hours as needed (pain).    . amLODipine (NORVASC) 5 MG tablet Take 1 tablet by mouth once daily 90 tablet 3  . apixaban (ELIQUIS) 5 MG TABS tablet Take 1 tablet (5 mg total) by mouth 2 (two) times daily. 60 tablet 3  . atorvastatin (LIPITOR) 80 MG tablet Take 1 tablet by mouth once daily 90 tablet 3  . Blood Glucose Monitoring Suppl (ACCU-CHEK AVIVA PLUS) w/Device  KIT Use as directed for 3 times daily testing of blood glucose 1 kit 0  . carvedilol (COREG) 25 MG tablet TAKE 1 TABLET BY MOUTH TWICE DAILY WITH A MEAL 180 tablet 3  . furosemide (LASIX) 40 MG tablet Take 80 mg by mouth 2 (two) times daily.    . glucose blood (ACCU-CHEK AVIVA PLUS) test strip Use as instructed for 3 times daily testing of blood glucose 100 each 12  . guaiFENesin (MUCINEX PO) Take 1 tablet by mouth 2 (two) times daily as needed (cough/congestion).    . hydrALAZINE (APRESOLINE) 50 MG tablet TAKE 1 & 1/2 (ONE & ONE-HALF) TABLETS BY MOUTH TWICE DAILY 90 tablet 0  . insulin glargine (LANTUS SOLOSTAR) 100 UNIT/ML Solostar Pen Inject 35 Units into the skin every morning. And pen needles 1/day 30 mL 3  . isosorbide mononitrate (IMDUR) 120 MG 24 hr tablet Take 1 tablet (120 mg total) by mouth daily. 30 tablet 3  . Lancet Devices (ACCU-CHEK SOFTCLIX) lancets Use as instructed for 3 times daily testing of blood glucose 1 each 0  . metFORMIN (GLUCOPHAGE) 1000 MG tablet TAKE 1 TABLET BY MOUTH TWICE DAILY WITH A MEAL 60 tablet 0  . sotalol (BETAPACE) 120 MG tablet Take 1 tablet (120 mg total) by mouth 2 (two) times daily. 180 tablet 3  . TRUEPLUS LANCETS 28G MISC 1 each by Does not apply route 3 (three) times daily. 100 each 12  . nitroGLYCERIN (NITROSTAT) 0.4 MG SL tablet Place 1 tablet (0.4 mg total) under the tongue every 5 (five) minutes as needed for chest pain (x 3 tabs daily). (Patient not taking: Reported on 04/05/2020) 60 tablet 1   No current facility-administered medications for this encounter.    BP 140/70   Pulse 82   Wt 94.1 kg (207 lb 6.4 oz)   SpO2 97%   BMI 28.13 kg/m   Wt Readings from Last 3 Encounters:  04/05/20 94.1 kg (207 lb 6.4 oz)  02/09/20 91.6 kg (202 lb)  02/08/20 91.9 kg (202 lb 9.6 oz)   PHYSICAL EXAM: General:  Well appearing. No resp difficulty HEENT: normal Neck: supple. no JVD. Carotids 2+ bilat; no bruits. No lymphadenopathy or thryomegaly  appreciated. Cor: PMI nondisplaced. Regular rate & rhythm. No rubs, gallops or murmurs. Lungs: clear Abdomen: soft, nontender, nondistended. No hepatosplenomegaly. No bruits or masses. Good bowel sounds. Extremities: no cyanosis, clubbing, rash, edema Neuro: alert & orientedx3, cranial   nerves grossly intact. moves all 4 extremities w/o difficulty. Affect pleasant  EKG: SR 76 bpm   Assessment/Plan:  1. Chronic diastolic CHF: EF 47% on cardiac MRI in 6/14 but EF improved back to 55-60% on 2/16 echo.  However, EF down to 26% on Cardiolite in 9/16.  Echo in 9/20 showed EF 60-65%, moderate LVH, normal RV, mild-moderate MR. Echo repeated 5/21, LVEF 55%. RV normal. -S/P Cardiomems 02/01/20 with initial reading PAD 24. No reading since 03/26/20. Reinforced use.  - NYHA III. Reds Clip 34%.  -Volume status stable. Continue lasix 80 mg/80 mg daily.  - Check BMET and BNP today  2. Atrial fibrillation/flutter: Paroxysmal. He has had atrial flutter ablations x 2 remotely.  He is on sotalol.   Zio patch 5/21 showed afib/flutter burden = 20%. No recent A fib. In NSR today.   Followed by Dr Taylor.  - Continue sotalol.  - Suspect untreated OSA may be contributing. Needs sleep study we will follow up to set up.  - Continue Eliquis.   - He has been referred to EP for evaluation for repeat ablation (flutter versus fibrillation/flutter) . 3. Coronary artery disease: LHC in 9/16 showed diffuse CAD, most significant involving the distal RCA.  Patient had DES x 2 to RCA.  Cath in 3/17 with patent RCA stents and diffuse distal and branch vessel disease concerning for poorly controlled diabetes.  Cath in 5/18 again showed diffuse CAD without good interventional target.  Cath in 11/20 showed severe distal and branch vessel disease but no interventional targets and not good anatomy for CABG.   - No chest pain.  - No ASA given Eliquis use.  - Continue atorvastatin 80 mg daily, good lipids 06/2019  - Continue Coreg,  amlodipine, and Imdur 120 mg daily for angina control.  - Avoid ranolazine with sotalol use. - Add jardiance 10 mg daily ( CAD + DM)   4. Hyperlipidemia:  On atorvastatin, good lipids 06/2019  5. Hypertension: historically controlled w/ medications.  Mildly elevated today.  6. OSA: not on CPAP. Did not tolerate previously. We discussed CV affects of untreated OSA including potential for worsening HF and recurrent atrial arrhthymias.  - Needs sleep study  7. CKD III a - Creatinine 1.33 on 02/08/20  -BMET today.  8. Syncope: no recent episodes.  9. Gout  Discussed purpose of jardiance and importance of monitoring glucose. Stressed importance of daily cardiomems. Check BMET today and in 7 days.  Follow up in 3 months.   Amy Clegg, NP-C 04/05/2020  

## 2020-04-05 ENCOUNTER — Ambulatory Visit (HOSPITAL_COMMUNITY)
Admission: RE | Admit: 2020-04-05 | Discharge: 2020-04-05 | Disposition: A | Discharge status: Home / Self Care | Payer: Medicare Other | Source: Ambulatory Visit | Attending: Adult Health | Admitting: Adult Health

## 2020-04-05 ENCOUNTER — Other Ambulatory Visit: Payer: Self-pay

## 2020-04-05 ENCOUNTER — Encounter (HOSPITAL_COMMUNITY): Payer: Self-pay

## 2020-04-05 VITALS — BP 140/70 | HR 82 | Wt 207.4 lb

## 2020-04-05 DIAGNOSIS — I428 Other cardiomyopathies: Secondary | ICD-10-CM | POA: Diagnosis not present

## 2020-04-05 DIAGNOSIS — G4733 Obstructive sleep apnea (adult) (pediatric): Secondary | ICD-10-CM

## 2020-04-05 DIAGNOSIS — Z955 Presence of coronary angioplasty implant and graft: Secondary | ICD-10-CM | POA: Insufficient documentation

## 2020-04-05 DIAGNOSIS — M109 Gout, unspecified: Secondary | ICD-10-CM | POA: Diagnosis not present

## 2020-04-05 DIAGNOSIS — Z7901 Long term (current) use of anticoagulants: Secondary | ICD-10-CM | POA: Insufficient documentation

## 2020-04-05 DIAGNOSIS — I4892 Unspecified atrial flutter: Secondary | ICD-10-CM | POA: Diagnosis not present

## 2020-04-05 DIAGNOSIS — Z8249 Family history of ischemic heart disease and other diseases of the circulatory system: Secondary | ICD-10-CM | POA: Diagnosis not present

## 2020-04-05 DIAGNOSIS — I480 Paroxysmal atrial fibrillation: Secondary | ICD-10-CM | POA: Insufficient documentation

## 2020-04-05 DIAGNOSIS — E785 Hyperlipidemia, unspecified: Secondary | ICD-10-CM | POA: Diagnosis not present

## 2020-04-05 DIAGNOSIS — N1831 Chronic kidney disease, stage 3a: Secondary | ICD-10-CM

## 2020-04-05 DIAGNOSIS — Z79899 Other long term (current) drug therapy: Secondary | ICD-10-CM | POA: Insufficient documentation

## 2020-04-05 DIAGNOSIS — Z794 Long term (current) use of insulin: Secondary | ICD-10-CM | POA: Diagnosis not present

## 2020-04-05 DIAGNOSIS — I429 Cardiomyopathy, unspecified: Secondary | ICD-10-CM | POA: Insufficient documentation

## 2020-04-05 DIAGNOSIS — B181 Chronic viral hepatitis B without delta-agent: Secondary | ICD-10-CM | POA: Diagnosis not present

## 2020-04-05 DIAGNOSIS — Z87891 Personal history of nicotine dependence: Secondary | ICD-10-CM | POA: Diagnosis not present

## 2020-04-05 DIAGNOSIS — Z9989 Dependence on other enabling machines and devices: Secondary | ICD-10-CM | POA: Insufficient documentation

## 2020-04-05 DIAGNOSIS — E1122 Type 2 diabetes mellitus with diabetic chronic kidney disease: Secondary | ICD-10-CM | POA: Diagnosis not present

## 2020-04-05 DIAGNOSIS — I5032 Chronic diastolic (congestive) heart failure: Secondary | ICD-10-CM | POA: Diagnosis not present

## 2020-04-05 DIAGNOSIS — Z9049 Acquired absence of other specified parts of digestive tract: Secondary | ICD-10-CM | POA: Diagnosis not present

## 2020-04-05 DIAGNOSIS — I25119 Atherosclerotic heart disease of native coronary artery with unspecified angina pectoris: Secondary | ICD-10-CM | POA: Insufficient documentation

## 2020-04-05 DIAGNOSIS — I130 Hypertensive heart and chronic kidney disease with heart failure and stage 1 through stage 4 chronic kidney disease, or unspecified chronic kidney disease: Secondary | ICD-10-CM | POA: Diagnosis not present

## 2020-04-05 LAB — BASIC METABOLIC PANEL
Anion gap: 11 (ref 5–15)
BUN: 8 mg/dL (ref 8–23)
CO2: 25 mmol/L (ref 22–32)
Calcium: 9.3 mg/dL (ref 8.9–10.3)
Chloride: 101 mmol/L (ref 98–111)
Creatinine, Ser: 1.25 mg/dL — ABNORMAL HIGH (ref 0.61–1.24)
GFR calc Af Amer: >60 mL/min (ref >60)
GFR calc non Af Amer: 60 mL/min — ABNORMAL LOW (ref >60)
Glucose, Bld: 284 mg/dL — ABNORMAL HIGH (ref 70–99)
Potassium: 3.7 mmol/L (ref 3.5–5.1)
Sodium: 137 mmol/L (ref 135–145)

## 2020-04-05 MED ORDER — EMPAGLIFLOZIN 10 MG PO TABS
10.0000 mg | ORAL_TABLET | Freq: Every day | ORAL | 4 refills | Status: DC
Start: 2020-04-05 — End: 2020-11-09

## 2020-04-05 NOTE — Patient Instructions (Addendum)
Lab work done today. We will notify you of any abnormal lab work. No news is good news!  Lab work will need to be done again in 1 week.  EKG done today.  START Jardiance 10mg  tab daily. Please check your blood sugar AT LEAST daily as this medication can lower your blood sugar.  PLEASE be sure to check your Cardiomems daily.  Please follow up with the [facility] in 3 months.  At the [facility], you and your health needs are our priority. As part of our continuing mission to provide you with exceptional heart care, we have created designated Provider Care Teams. These Care Teams include your primary Cardiologist (physician) and Advanced Practice Providers (APPs- Physician Assistants and Nurse Practitioners) who all work together to provide you with the care you need, when you need it.   You may see any of the following providers on your designated Care Team at your next follow up: [name] Dr Glori Bickers . Dr Loralie Champagne . Darrick Grinder, NP . Lyda Jester, PA . Audry Riles, PharmD   Please be sure to bring in all your medications bottles to every appointment.

## 2020-04-06 ENCOUNTER — Encounter: Payer: Self-pay | Admitting: Physician Assistant

## 2020-04-06 ENCOUNTER — Ambulatory Visit
Admission: EM | Admit: 2020-04-06 | Discharge: 2020-04-06 | Disposition: A | Discharge status: Home / Self Care | Payer: Medicare Other | Attending: Physician Assistant | Admitting: Physician Assistant

## 2020-04-06 ENCOUNTER — Other Ambulatory Visit: Payer: Self-pay

## 2020-04-06 ENCOUNTER — Ambulatory Visit (INDEPENDENT_AMBULATORY_CARE_PROVIDER_SITE_OTHER): Payer: Medicare Other

## 2020-04-06 DIAGNOSIS — Z1152 Encounter for screening for COVID-19: Secondary | ICD-10-CM | POA: Diagnosis not present

## 2020-04-06 DIAGNOSIS — R059 Cough, unspecified: Secondary | ICD-10-CM

## 2020-04-06 DIAGNOSIS — R0602 Shortness of breath: Secondary | ICD-10-CM | POA: Diagnosis not present

## 2020-04-06 DIAGNOSIS — R05 Cough: Secondary | ICD-10-CM | POA: Diagnosis not present

## 2020-04-06 MED ORDER — AEROCHAMBER PLUS FLO-VU MEDIUM MISC
1.0000 | Freq: Once | Status: AC
  Administered 2020-04-06: 1

## 2020-04-06 MED ORDER — ALBUTEROL SULFATE HFA 108 (90 BASE) MCG/ACT IN AERS: 1.0000 | INHALATION_SPRAY | Freq: Four times a day (QID) | RESPIRATORY_TRACT | 0 refills | Status: DC | PRN

## 2020-04-06 NOTE — Discharge Instructions (Signed)
Your chest xray was negative for pneumonia, fluid in lungs. COVID testing ordered, please quarantine until testing results return. Albuterol 1-2 puffs every 4-6 hours. Follow up with PCP if symptoms not improving. If significant worsening of symptoms, fever, passing out, go to the emergency department for further evaluation.

## 2020-04-06 NOTE — ED Triage Notes (Signed)
Pt c/o nonproductive cough, SOB,. malaise for approx 2 weeks.  Denies sore throat, congestion, runny nose, ear pain, abdominal pain, n/v/d, fever, chills. Pt SOB on exam and able to speak only short phrases approx 3 words b/t breaths. Lung sounds diminished bilaterally, +1 edema to bilateral lower legs. S1 S2 irreg rhythm.

## 2020-04-06 NOTE — ED Provider Notes (Signed)
[facility]    CSN: [id number] Arrival date & time: 04/06/20  1135      History   Chief Complaint Chief Complaint  Patient presents with  . Cough  . Shortness of Breath    HPI [name] [name] is a 64 y.o. male.   64 year old male with history of CAD, CHF, DM, HLD, A. fib on Eliquis comes in for 2-week history of shortness of breath.  Patient states for the past few months, has had nonproductive cough.  About 1 month ago, had a syncopal episode that was thought to be due to cough after being evaluated by his providers.  States for the past 2 weeks, has felt short of breath.  This is constant without significant worsening during ambulation.  Denies worsening of cough since shortness of breath onset.  Denies further episodes of syncope.  Denies fever, sore throat, rhinorrhea, nasal congestion.  Denies leg swelling.  Daily weight in without significant changes to weight. Saw cardiology yesterday without significant findings.      Past Medical History:  Diagnosis Date  . Anginal pain ([place])   . Anxiety   . Arrhythmia   . CAD (coronary artery disease), native coronary artery    a. Nonobstructive CAD by cath 2013 - diffuse distal and branch vessel CAD, no severe disease in the major coronaries, LV mild global hypokinesis, EF 45%. b. ETT-Sestamibi 5/14: EF 31%, small fixed inferior defect with no ischemia.  . Cholecystitis   . Chronic CHF ([place])    a. Mixed ICM/NICM (?EtOH). EF 35% in 2008. Echo 5/13: EF 60-65%, mod LVH, EF 45% on V gram in 12/2011. EF 12/2012: EF 50-55%, mild LVH, inferobasal HK, mild MR. ETT-Ses 5/14 EF 41%. Cardiac MRI 5/14: EF 44%, mild global HK, subepicardial delayed enhancement in nonspecific RV insertion pattern.  . COLONIC POLYPS, HX OF 12/30/2006  . Gout   . H/O atrial flutter 2007   a. Ablations in 2007, 2008.  [name] Heart murmur   . HEPATITIS B, CHRONIC 12/30/2006  . History of alcohol abuse   . History of hiatal hernia   . History of  medication noncompliance   . History of nuclear stress test    Myoview 3/17: + chest pain; EF 33%, downsloping ST depression 2, V4-6, LVH with strain, no ischemia on images; intermediate risk   . HYPERCHOLESTEROLEMIA 07/11/2010  . Hypertension   . Left sciatic nerve pain since 04/2015  . LIVER FUNCTION TESTS, ABNORMAL, HX OF 12/30/2006  . MITRAL REGURGITATION 12/30/2006  . 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 PAF (paroxysmal atrial fibrillation) (H[facility])    On coumadin  . Rectal bleeding 12/18/2011   Scheduled for colonoscopy.    . Shortness of breath dyspnea    with excertion  . Sleep apnea    "suppose to send mask but they never did" (05/03/2015)  . Type II diabetes mellitus [facility])     Patient Active Problem List   Diagnosis Date Noted  . Syncope 02/09/2020  . Generalized weakness 06/27/2019  . Acute on chronic congestive heart failure with left ventricular diastolic dysfunction ([place]) 06/26/2019  . Anemia due to chronic kidney disease 06/26/2019  . CKD (chronic kidney disease), stage III 06/26/2019  . Type 2 diabetes mellitus without complication ([place]) 58/04/9832  . Iron deficiency anemia due to chronic blood loss 06/26/2019  . History of atrial fibrillation   . History of type 2 diabetes mellitus   . Anemia   . Chest pain 04/24/2019  .  Overweight (BMI 25.0-29.9) 02/28/2017  . Unstable angina ([place]) 08/03/2016  . S/P laparoscopic cholecystectomy 02/07/2016  . Coronary artery disease involving native coronary artery with unstable angina pectoris ([place]) 12/29/2015  . Diabetes ([place]) 11/12/2015  . Chronic anticoagulation 08/22/2015  . Atrial flutter with controlled response ([place]) 08/22/2015  . RUQ pain   . Cholelithiases- drain in place 06/04/2015  . Essential hypertension   . Chronic diastolic CHF (congestive heart failure) ([place]) 04/24/2015  . Excessive daytime sleepiness 11/25/2014  . Obstructive sleep apnea 11/25/2014  . NICM (nonischemic cardiomyopathy) ([place])  03/11/2014  . Anticoagulation goal of INR 2 to 3 10/19/2013  . Eczema 10/19/2013  . Low libido 10/16/2013  . Persistent atrial fibrillation ([place]) 10/06/2013  . Rectal bleeding 12/18/2011  . HYPERCHOLESTEROLEMIA 07/11/2010  . DENTAL CARIES 06/30/2010  . GOUT 05/05/2009  . HEPATITIS B, CHRONIC 12/30/2006  . OSTEOCHONDROMA 12/30/2006  . Mitral valve disorder 12/30/2006  . LIVER FUNCTION TESTS, ABNORMAL, HX OF 12/30/2006  . COLONIC POLYPS, HX OF 12/30/2006  . COLONOSCOPY, HX OF 05/12/2001    Past Surgical History:  Procedure Laterality Date  . A FLUTTER ABLATION  2007, 2008   catheter ablation   . CARDIAC CATHETERIZATION N/A 05/03/2015   Procedure: Right/Left Heart Cath and Coronary Angiography;  Surgeon: Larey Dresser, MD;  Location: [place] [facility];  Service: Cardiovascular;  Laterality: N/A;  . CARDIAC CATHETERIZATION N/A 05/03/2015   Procedure: Coronary Stent Intervention;  Surgeon: Jettie Booze, MD;  Location: [place] [facility];  Service: Cardiovascular;  Laterality: N/A;  . CARDIAC CATHETERIZATION N/A 11/04/2015   Procedure: Left Heart Cath and Coronary Angiography;  Surgeon: Larey Dresser, MD;  Location: [place] [facility];  Service: Cardiovascular;  Laterality: N/A;  . CARDIOVERSION N/A 08/24/2015   Procedure: CARDIOVERSION;  Surgeon: Thayer Headings, MD;  Location[facility] ENDOSCOPY;  Service: Cardiovascular;  Laterality: N/A;  . CHOLECYSTECTOMY N/A 02/07/2016   Procedure: LAPAROSCOPIC CHOLECYSTECTOMY WITH  INTRAOPERATIVE CHOLANGIOGRAM;  Surgeon: Greer Pickerel, MD;  Location: [facility];  Service: General;  Laterality: N/A;  . COLONOSCOPY WITH PROPOFOL N/A 04/28/2019   Procedure: COLONOSCOPY WITH PROPOFOL;  Surgeon: Ronnette Juniper, MD;  Location: [place];  Service: Gastroenterology;  Laterality: N/A;  . CORONARY ANGIOPLASTY  12/05/01  . ESOPHAGOGASTRODUODENOSCOPY (EGD) WITH PROPOFOL N/A 04/27/2019   Procedure: ESOPHAGOGASTRODUODENOSCOPY (EGD) WITH PROPOFOL;  Surgeon: Ronnette Juniper, MD;  Location: [place];  Service: Gastroenterology;  Laterality: N/A;  . GIVENS CAPSULE STUDY N/A 06/29/2019   Procedure: GIVENS CAPSULE STUDY;  Surgeon: Wilford Corner, MD;  Location: [place];  Service: Endoscopy;  Laterality: N/A;  . LEFT HEART CATH AND CORONARY ANGIOGRAPHY N/A 12/28/2016   Procedure: Left Heart Cath and Coronary Angiography;  Surgeon: Larey Dresser, MD;  Location: [place] [facility];  Service: Cardiovascular;  Laterality: N/A;  . LEFT HEART CATHETERIZATION WITH CORONARY ANGIOGRAM N/A 12/19/2011   Procedure: LEFT HEART CATHETERIZATION WITH CORONARY ANGIOGRAM;  Surgeon: Larey Dresser, MD;  Location: [facility] CATH LAB;  Service: Cardiovascular;  Laterality: N/A;  . OSTEOCHONDROMA EXCISION Left 1972   "took bone tumor off my shoulder"  . POLYPECTOMY  04/28/2019   Procedure: POLYPECTOMY;  Surgeon: Ronnette Juniper, MD;  Location: [facility] ENDOSCOPY;  Service: Gastroenterology;;  . PRESSURE SENSOR/CARDIOMEMS N/A 02/01/2020   Procedure: PRESSURE SENSOR/CARDIOMEMS;  Surgeon: Larey Dresser, MD;  Location: [place] [facility];  Service: Cardiovascular;  Laterality: N/A;  . RIGHT HEART CATH N/A 02/01/2020   Procedure: RIGHT HEART CATH;  Surgeon: Larey Dresser,  MD;  Location: [place] [facility];  Service: Cardiovascular;  Laterality: N/A;  . RIGHT/LEFT HEART CATH AND CORONARY ANGIOGRAPHY N/A 07/01/2019   Procedure: RIGHT/LEFT HEART CATH AND CORONARY ANGIOGRAPHY;  Surgeon: Larey Dresser, MD;  Location: [place] [facility];  Service: Cardiovascular;  Laterality: N/A;       Home Medications    Prior to Admission medications   Medication Sig Start Date End Date Taking? Authorizing Provider  amLODipine (NORVASC) 5 MG tablet Take 1 tablet by mouth once daily 10/13/19  Yes Larey Dresser, MD  apixaban (ELIQUIS) 5 MG TABS tablet Take 1 tablet (5 mg total) by mouth 2 (two) times daily. 02/08/20  Yes Clegg, Zoe Creasman D, NP  atorvastatin (LIPITOR) 80 MG tablet Take 1 tablet by mouth once  daily 02/16/20  Yes Larey Dresser, MD  carvedilol (COREG) 25 MG tablet TAKE 1 TABLET BY MOUTH TWICE DAILY WITH A MEAL 01/12/20  Yes Larey Dresser, MD  empagliflozin (JARDIANCE) 10 MG TABS tablet Take 1 tablet (10 mg total) by mouth daily before breakfast. 04/05/20  Yes Clegg, Damarkus Balis D, NP  furosemide (LASIX) 40 MG tablet Take 80 mg by mouth 2 (two) times daily.   Yes [provider]  insulin glargine (LANTUS SOLOSTAR) 100 UNIT/ML Solostar Pen Inject 35 Units into the skin every morning. And pen needles 1/day 11/20/19  Yes [name], MD  isosorbide mononitrate (IMDUR) 120 MG 24 hr tablet Take 1 tablet (120 mg total) by mouth daily. 08/31/19 01/06/21 Yes Bensimhon, Shaune Pascal, MD  metFORMIN (GLUCOPHAGE) 1000 MG tablet TAKE 1 TABLET BY MOUTH TWICE DAILY WITH A MEAL 03/03/20  Yes [name], MD  sotalol (BETAPACE) 120 MG tablet Take 1 tablet (120 mg total) by mouth 2 (two) times daily. 02/08/20  Yes Clegg, Andrewjames Weirauch D, NP  ACCU-CHEK SOFTCLIX LANCETS lancets Use as instructed for three times daily testing of blood glucose 09/06/16   Jegede, Olugbemiga E, MD  acetaminophen (TYLENOL) 500 MG tablet Take 1,000 mg by mouth every 6 (six) hours as needed (pain).    [provider]  albuterol (VENTOLIN HFA) 108 (90 Base) MCG/ACT inhaler Inhale 1-2 puffs into the lungs every 6 (six) hours as needed for wheezing or shortness of breath. 04/06/20   Tasia Catchings, Kashish Yglesias V, PA-C  Blood Glucose Monitoring Suppl (ACCU-CHEK AVIVA PLUS) w/Device KIT Use as directed for 3 times daily testing of blood glucose 09/06/16   Jegede, Olugbemiga E, MD  glucose blood (ACCU-CHEK AVIVA PLUS) test strip Use as instructed for 3 times daily testing of blood glucose 09/06/16   Jegede, Olugbemiga E, MD  guaiFENesin (MUCINEX PO) Take 1 tablet by mouth 2 (two) times daily as needed (cough/congestion).    [provider]  hydrALAZINE (APRESOLINE) 50 MG tablet TAKE 1 & 1/2 (ONE & ONE-HALF) TABLETS BY MOUTH TWICE DAILY 03/01/20   Bensimhon,  Shaune Pascal, MD  Lancet Devices [facility]) lancets Use as instructed for 3 times daily testing of blood glucose 09/06/16   Jegede, Olugbemiga E, MD  nitroGLYCERIN (NITROSTAT) 0.4 MG SL tablet Place 1 tablet (0.4 mg total) under the tongue every 5 (five) minutes as needed for chest pain (x 3 tabs daily). Patient not taking: Reported on 04/05/2020 09/29/18   Charlott Rakes, MD  TRUEPLUS LANCETS 28G MISC 1 each by Does not apply route 3 (three) times daily. 08/22/16   Tresa Garter, MD    Family History Family History  Problem Relation Age of Onset  . Diabetes  Mother   . Hypertension Mother   . Heart attack Neg Hx   . Stroke Neg Hx     Social History Social History   Tobacco Use  . Smoking status: Former Smoker    Packs/day: 4.00    Years: 18.00    Pack years: 72.00    Types: Cigarettes    Quit date: 08/14/1983    Years since quitting: 36.6  . Smokeless tobacco: Never Used  Vaping Use  . Vaping Use: Never used  Substance Use Topics  . Alcohol use: Yes    Alcohol/week: 16.0 standard drinks    Types: 6 Cans of beer, 10 Shots of liquor per week    Comment: occasionally  . Drug use: Yes    Types: Marijuana    Comment: twice a week     Allergies   Shrimp [shellfish allergy], Ace inhibitors, and Other   Review of Systems Review of Systems  Reason unable to perform ROS: See HPI as above.     Physical Exam Triage Vital Signs ED Triage Vitals  Enc Vitals Group     BP 04/06/20 1309 (!) 168/79     Pulse Rate 04/06/20 1309 86     Resp 04/06/20 1309 18     Temp 04/06/20 1309 98.5 F (36.9 C)     Temp Source 04/06/20 1309 Oral     SpO2 04/06/20 1309 93 %     Weight --      Height --      Head Circumference --      Peak Flow --      Pain Score 04/06/20 1321 0     Pain Loc --      Pain Edu? --      Excl. in [place]? --    No data found.  Updated Vital Signs BP (!) 168/79 (BP Location: Left Arm)   Pulse 86   Temp 98.5 F (36.9 C) (Oral)   Resp 18   SpO2  93%   Physical Exam Constitutional:      General: He is not in acute distress.    Appearance: Normal appearance. He is not ill-appearing, toxic-appearing or diaphoretic.  HENT:     Head: Normocephalic and atraumatic.     Mouth/Throat:     Mouth: Mucous membranes are moist.     Pharynx: Oropharynx is clear. Uvula midline.  Cardiovascular:     Rate and Rhythm: Normal rate. Rhythm irregular.     Heart sounds: No murmur heard.  No friction rub. No gallop.   Pulmonary:     Effort: Pulmonary effort is normal. No accessory muscle usage, prolonged expiration, respiratory distress or retractions.     Comments: Lungs clear to auscultation without adventitious lung sounds. Musculoskeletal:     Cervical back: Normal range of motion and neck supple.     Comments: No leg edema/tendernss  Neurological:     General: No focal deficit present.     Mental Status: He is alert and oriented to person, place, and time.      UC Treatments / Results  Labs (all labs ordered are listed, but only abnormal results are displayed) Labs Reviewed  NOVEL CORONAVIRUS, NAA    EKG   Radiology DG Chest 2 View  Result Date: 04/06/2020 CLINICAL DATA:  Cough and shortness of breath EXAM: CHEST - 2 VIEW COMPARISON:  December 02, 2019 FINDINGS: Lungs are clear. Heart size and pulmonary vascularity are normal. No adenopathy. There is mild degenerative change in the thoracic  spine. IMPRESSION: Lungs are clear.  Cardiac silhouette normal. Electronically Signed   By: Lowella Grip III M.D.   On: 04/06/2020 14:13    Procedures Procedures (including critical care time)  Medications Ordered in UC Medications  AeroChamber Plus Flo-Vu Medium MISC 1 each (1 each Other Given by Other 04/06/20 1437)    Initial Impression / Assessment and Plan / UC Course  I have reviewed the triage vital signs and the nursing notes.  Pertinent labs & imaging results that were available during my care of the patient were reviewed by  me and considered in my medical decision making (see chart for details).    Patient afebrile, nontoxic. Sitting comfortably without tachypnea. When talking, few episodes of cough/restraining to cough, ?reactive airway. O2 sat 93-95%. LCTAB. No significant leg swelling noted. Patient saw cardiology yesterday with no worries of heart involvement of current symptoms. Has irregular rhythm, rate controlled, on eliquis. Will obtain CXR for further evaluation.  CXR negative for active cardiopulmonary disease. COVID testing ordered. Will trial albuterol as directed. Return precautions given.  Final Clinical Impressions(s) / UC Diagnoses   Final diagnoses:  Encounter for screening for COVID-19  Cough  Shortness of breath    ED Prescriptions    Medication Sig Dispense Auth. Provider   albuterol (VENTOLIN HFA) 108 (90 Base) MCG/ACT inhaler Inhale 1-2 puffs into the lungs every 6 (six) hours as needed for wheezing or shortness of breath. 8 g Ok Edwards, PA-C     PDMP not reviewed this encounter.   Ok Edwards, PA-C 04/06/20 1529

## 2020-04-07 ENCOUNTER — Other Ambulatory Visit (HOSPITAL_COMMUNITY): Payer: Self-pay | Admitting: Internal Medicine

## 2020-04-08 LAB — NOVEL CORONAVIRUS, NAA: SARS-CoV-2, NAA: NOT DETECTED

## 2020-04-08 LAB — SARS-COV-2, NAA 2 DAY TAT

## 2020-04-09 ENCOUNTER — Other Ambulatory Visit: Payer: Self-pay | Admitting: Family Medicine

## 2020-04-09 DIAGNOSIS — E1169 Type 2 diabetes mellitus with other specified complication: Secondary | ICD-10-CM

## 2020-04-09 DIAGNOSIS — Z794 Long term (current) use of insulin: Secondary | ICD-10-CM

## 2020-04-14 ENCOUNTER — Other Ambulatory Visit (HOSPITAL_COMMUNITY): Payer: Medicare Other

## 2020-06-01 ENCOUNTER — Other Ambulatory Visit (HOSPITAL_COMMUNITY): Payer: Self-pay | Admitting: Internal Medicine

## 2020-06-01 DIAGNOSIS — I5032 Chronic diastolic (congestive) heart failure: Secondary | ICD-10-CM

## 2020-06-20 ENCOUNTER — Telehealth (HOSPITAL_COMMUNITY): Payer: Self-pay | Admitting: *Deleted

## 2020-06-20 NOTE — Telephone Encounter (Signed)
pts daughter left VM requesting a sooner appt than scheduled appt on 12/3 because patient has been experiencing chest pain. I called back to get more information. No answer/left vm requesting pt return call.

## 2020-06-21 NOTE — Telephone Encounter (Signed)
  I called him today.   Complaining chest pain 5/10 when lifting items. Says he has been lifting his grandson up and down. He is able palpate the area of tenderness. Taking all medications.   Says he has been eating crackers.   Cardiomems elevated 30 mm hg. Goal is 24 mm hg   Instructed to take 120 mg po lasix twice a day for 2 days.  Discussed low salt food choices.   Asked him to send daily readings and will adjust diuretics as needed. May need to switch to torsemide.   He was instructed to go to the ED if chest pain persists.   Tayshaun Kroh NP-C  1:22 PM

## 2020-06-21 NOTE — Telephone Encounter (Signed)
Spoke with pt and patients daughter. Pt said he has chest pain in the center of his chest constantly but it gets worse when he lifts anything or moves his arms around. Pt denies shortness of breath, fatigue, or swelling. Pt said the pain isn't sharp but its more tightness than anything. Pt said the pain has worsened over the last four days. Pt is prescribed nitroglycerin but has not taken any. Pt states he takes all other medications as prescribed. Pt does not think the pain is bad enough to go to the emergency room.    Routed to [facility] for advice

## 2020-07-10 ENCOUNTER — Other Ambulatory Visit (HOSPITAL_COMMUNITY): Payer: Self-pay | Admitting: Cardiology

## 2020-07-10 DIAGNOSIS — I5032 Chronic diastolic (congestive) heart failure: Secondary | ICD-10-CM

## 2020-07-15 ENCOUNTER — Other Ambulatory Visit: Payer: Self-pay

## 2020-07-15 ENCOUNTER — Encounter (HOSPITAL_COMMUNITY): Payer: Self-pay | Admitting: Cardiology

## 2020-07-15 ENCOUNTER — Ambulatory Visit (HOSPITAL_COMMUNITY)
Admission: RE | Admit: 2020-07-15 | Discharge: 2020-07-15 | Disposition: A | Discharge status: Home / Self Care | Payer: Medicare Other | Source: Ambulatory Visit | Attending: Cardiology | Admitting: Cardiology

## 2020-07-15 VITALS — BP 138/72 | HR 74 | Ht 72.0 in | Wt 209.8 lb

## 2020-07-15 DIAGNOSIS — Z87891 Personal history of nicotine dependence: Secondary | ICD-10-CM | POA: Insufficient documentation

## 2020-07-15 DIAGNOSIS — E1122 Type 2 diabetes mellitus with diabetic chronic kidney disease: Secondary | ICD-10-CM | POA: Insufficient documentation

## 2020-07-15 DIAGNOSIS — Z794 Long term (current) use of insulin: Secondary | ICD-10-CM | POA: Insufficient documentation

## 2020-07-15 DIAGNOSIS — Z9049 Acquired absence of other specified parts of digestive tract: Secondary | ICD-10-CM | POA: Diagnosis not present

## 2020-07-15 DIAGNOSIS — I480 Paroxysmal atrial fibrillation: Secondary | ICD-10-CM | POA: Insufficient documentation

## 2020-07-15 DIAGNOSIS — Z7984 Long term (current) use of oral hypoglycemic drugs: Secondary | ICD-10-CM | POA: Diagnosis not present

## 2020-07-15 DIAGNOSIS — G4719 Other hypersomnia: Secondary | ICD-10-CM | POA: Diagnosis not present

## 2020-07-15 DIAGNOSIS — Z9119 Patient's noncompliance with other medical treatment and regimen: Secondary | ICD-10-CM | POA: Insufficient documentation

## 2020-07-15 DIAGNOSIS — Z79899 Other long term (current) drug therapy: Secondary | ICD-10-CM | POA: Insufficient documentation

## 2020-07-15 DIAGNOSIS — Z8249 Family history of ischemic heart disease and other diseases of the circulatory system: Secondary | ICD-10-CM | POA: Insufficient documentation

## 2020-07-15 DIAGNOSIS — I130 Hypertensive heart and chronic kidney disease with heart failure and stage 1 through stage 4 chronic kidney disease, or unspecified chronic kidney disease: Secondary | ICD-10-CM | POA: Insufficient documentation

## 2020-07-15 DIAGNOSIS — Z7901 Long term (current) use of anticoagulants: Secondary | ICD-10-CM | POA: Diagnosis not present

## 2020-07-15 DIAGNOSIS — G4733 Obstructive sleep apnea (adult) (pediatric): Secondary | ICD-10-CM | POA: Diagnosis not present

## 2020-07-15 DIAGNOSIS — N189 Chronic kidney disease, unspecified: Secondary | ICD-10-CM | POA: Diagnosis not present

## 2020-07-15 DIAGNOSIS — Z955 Presence of coronary angioplasty implant and graft: Secondary | ICD-10-CM | POA: Diagnosis not present

## 2020-07-15 DIAGNOSIS — I5032 Chronic diastolic (congestive) heart failure: Secondary | ICD-10-CM | POA: Insufficient documentation

## 2020-07-15 DIAGNOSIS — R0683 Snoring: Secondary | ICD-10-CM

## 2020-07-15 DIAGNOSIS — E785 Hyperlipidemia, unspecified: Secondary | ICD-10-CM | POA: Insufficient documentation

## 2020-07-15 DIAGNOSIS — I25119 Atherosclerotic heart disease of native coronary artery with unspecified angina pectoris: Secondary | ICD-10-CM | POA: Diagnosis not present

## 2020-07-15 LAB — BASIC METABOLIC PANEL
Anion gap: 12 (ref 5–15)
BUN: 16 mg/dL (ref 8–23)
CO2: 23 mmol/L (ref 22–32)
Calcium: 9.6 mg/dL (ref 8.9–10.3)
Chloride: 102 mmol/L (ref 98–111)
Creatinine, Ser: 1.19 mg/dL (ref 0.61–1.24)
GFR, Estimated: >60 mL/min (ref >60)
Glucose, Bld: 218 mg/dL — ABNORMAL HIGH (ref 70–99)
Potassium: 4.1 mmol/L (ref 3.5–5.1)
Sodium: 137 mmol/L (ref 135–145)

## 2020-07-15 LAB — LIPID PANEL
Cholesterol: 124 mg/dL (ref 0–200)
HDL: 43 mg/dL (ref >40)
LDL Cholesterol: 58 mg/dL (ref 0–99)
Total CHOL/HDL Ratio: 2.9 RATIO
Triglycerides: 116 mg/dL (ref <150)
VLDL: 23 mg/dL (ref 0–40)

## 2020-07-15 MED ORDER — TORSEMIDE 20 MG PO TABS
40.0000 mg | ORAL_TABLET | Freq: Two times a day (BID) | ORAL | 3 refills | Status: DC
Start: 2020-07-15 — End: 2020-12-02

## 2020-07-15 NOTE — Progress Notes (Addendum)
Subjective:   Jacon Whetzel is a 64 y.o. male who presents for an Initial Medicare Annual Wellness Visit.  Review of Systems    N/A  Cardiac Risk Factors include: advanced age (>11mn, >>66women);dyslipidemia;diabetes mellitus;male gender;hypertension     Objective:    Today's Vitals   07/18/20 1355  BP: 116/64  Pulse: 76  Temp: 98.9 F (37.2 C)  TempSrc: Oral  SpO2: 95%  Weight: 210 lb 9 oz (95.5 kg)  Height: 6' (1.829 m)   Body mass index is 28.56 kg/m.  Advanced Directives 07/18/2020 02/01/2020 01/29/2020 12/02/2019 06/28/2019 06/26/2019 04/27/2019  Does Patient Have a Medical Advance Directive? _0  No No  Does patient want to make changes to medical advance directive? No - Patient declined - - - - - -  Would patient like information on creating a medical advance directive? - No - Patient declined No - Patient declined No - Patient declined No - Patient declined - No - Patient declined  Pre-existing out of facility DNR order (yellow form or pink MOST form) - - - - - - -    Current Medications (verified) Outpatient Encounter Medications as of 07/18/2020  Medication Sig  . ACCU-CHEK SOFTCLIX LANCETS lancets Use as instructed for three times daily testing of blood glucose  . acetaminophen (TYLENOL) 500 MG tablet Take 1,000 mg by mouth every 6 (six) hours as needed (pain).  .Marland Kitchenalbuterol (VENTOLIN HFA) 108 (90 Base) MCG/ACT inhaler Inhale 1-2 puffs into the lungs every 6 (six) hours as needed for wheezing or shortness of breath.  .Marland KitchenamLODipine (NORVASC) 5 MG tablet Take 1 tablet by mouth once daily  . apixaban (ELIQUIS) 5 MG TABS tablet Take 1 tablet (5 mg total) by mouth 2 (two) times daily.  .Marland Kitchenatorvastatin (LIPITOR) 80 MG tablet Take 1 tablet by mouth once daily  . Blood Glucose Monitoring Suppl (ACCU-CHEK AVIVA PLUS) w/Device KIT Use as directed for 3 times daily testing of blood glucose  . carvedilol (COREG) 25 MG tablet TAKE 1 TABLET BY MOUTH TWICE DAILY  WITH A MEAL  . empagliflozin (JARDIANCE) 10 MG TABS tablet Take 1 tablet (10 mg total) by mouth daily before breakfast.  . glucose blood (ACCU-CHEK AVIVA PLUS) test strip Use as instructed for 3 times daily testing of blood glucose  . hydrALAZINE (APRESOLINE) 50 MG tablet TAKE 1 & 1/2 (ONE & ONE-HALF) TABLETS BY MOUTH TWICE DAILY  . insulin glargine (LANTUS SOLOSTAR) 100 UNIT/ML Solostar Pen Inject 35 Units into the skin every morning. And pen needles 1/day  . isosorbide mononitrate (IMDUR) 120 MG 24 hr tablet Take 1 tablet by mouth once daily  . Lancet Devices (ACCU-CHEK SOFTCLIX) lancets Use as instructed for 3 times daily testing of blood glucose  . metFORMIN (GLUCOPHAGE) 1000 MG tablet TAKE 1 TABLET BY MOUTH TWICE DAILY WITH A MEAL  . sotalol (BETAPACE) 120 MG tablet Take 1 tablet (120 mg total) by mouth 2 (two) times daily.  .Marland Kitchentorsemide (DEMADEX) 20 MG tablet Take 2 tablets (40 mg total) by mouth 2 (two) times daily.  . TRUEPLUS LANCETS 28G MISC 1 each by Does not apply route 3 (three) times daily.  .Marland KitchenguaiFENesin (MUCINEX PO) Take 1 tablet by mouth 2 (two) times daily as needed (cough/congestion). (Patient not taking: Reported on 07/18/2020)  . nitroGLYCERIN (NITROSTAT) 0.4 MG SL tablet Place 1 tablet (0.4 mg total) under the tongue every 5 (five) minutes as needed for chest pain (x 3 tabs daily). (  Patient not taking: Reported on 07/18/2020)   No facility-administered encounter medications on file as of 07/18/2020.    Allergies (verified) Shrimp [shellfish allergy], Ace inhibitors, and Other   History: Past Medical History:  Diagnosis Date  . Anginal pain ([place])   . Anxiety   . Arrhythmia   . CAD (coronary artery disease), native coronary artery    a. Nonobstructive CAD by cath 2013 - diffuse distal and branch vessel CAD, no severe disease in the major coronaries, LV mild global hypokinesis, EF 45%. b. ETT-Sestamibi 5/14: EF 31%, small fixed inferior defect with no ischemia.  .  Cholecystitis   . Chronic CHF ([place])    a. Mixed ICM/NICM (?EtOH). EF 35% in 2008. Echo 5/13: EF 60-65%, mod LVH, EF 45% on V gram in 12/2011. EF 12/2012: EF 50-55%, mild LVH, inferobasal HK, mild MR. ETT-Ses 5/14 EF 41%. Cardiac MRI 5/14: EF 44%, mild global HK, subepicardial delayed enhancement in nonspecific RV insertion pattern.  . COLONIC POLYPS, HX OF 12/30/2006  . Gout   . H/O atrial flutter 2007   a. Ablations in 2007, 2008.  [name] Heart murmur   . HEPATITIS B, CHRONIC 12/30/2006  . History of alcohol abuse   . History of hiatal hernia   . History of medication noncompliance   . History of nuclear stress test    Myoview 3/17: + chest pain; EF 33%, downsloping ST depression 2, V4-6, LVH with strain, no ischemia on images; intermediate risk   . HYPERCHOLESTEROLEMIA 07/11/2010  . Hypertension   . Left sciatic nerve pain since 04/2015  . LIVER FUNCTION TESTS, ABNORMAL, HX OF 12/30/2006  . MITRAL REGURGITATION 12/30/2006  . 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 PAF (paroxysmal atrial fibrillation) ([facility])    On coumadin  . Rectal bleeding 12/18/2011   Scheduled for colonoscopy.    . Shortness of breath dyspnea    with excertion  . Sleep apnea    "suppose to send mask but they never did" (05/03/2015)  . Type II diabetes mellitus ([place])    Past Surgical History:  Procedure Laterality Date  . A FLUTTER ABLATION  2007, 2008   catheter ablation   . CARDIAC CATHETERIZATION N/A 05/03/2015   Procedure: Right/Left Heart Cath and Coronary Angiography;  Surgeon: Larey Dresser, MD;  Location: [place] [facility];  Service: Cardiovascular;  Laterality: N/A;  . CARDIAC CATHETERIZATION N/A 05/03/2015   Procedure: Coronary Stent Intervention;  Surgeon: Jettie Booze, MD;  Location: [place] [facility];  Service: Cardiovascular;  Laterality: N/A;  . CARDIAC CATHETERIZATION N/A 11/04/2015   Procedure: Left Heart Cath and Coronary Angiography;  Surgeon: Larey Dresser, MD;  Location: [place] [facility];  Service: Cardiovascular;  Laterality: N/A;  . CARDIOVERSION N/A 08/24/2015   Procedure: CARDIOVERSION;  Surgeon: Thayer Headings, MD;  Location[facility] ENDOSCOPY;  Service: Cardiovascular;  Laterality: N/A;  . CHOLECYSTECTOMY N/A 02/07/2016   Procedure: LAPAROSCOPIC CHOLECYSTECTOMY WITH  INTRAOPERATIVE CHOLANGIOGRAM;  Surgeon: Greer Pickerel, MD;  Location: [facility];  Service: General;  Laterality: N/A;  . COLONOSCOPY WITH PROPOFOL N/A 04/28/2019   Procedure: COLONOSCOPY WITH PROPOFOL;  Surgeon: Ronnette Juniper, MD;  Location: [place];  Service: Gastroenterology;  Laterality: N/A;  . CORONARY ANGIOPLASTY  12/05/01  . ESOPHAGOGASTRODUODENOSCOPY (EGD) WITH PROPOFOL N/A 04/27/2019   Procedure: ESOPHAGOGASTRODUODENOSCOPY (EGD) WITH PROPOFOL;  Surgeon: Ronnette Juniper, MD;  Location: [place];  Service: Gastroenterology;  Laterality: N/A;  . GIVENS CAPSULE STUDY N/A 06/29/2019   Procedure: GIVENS  CAPSULE STUDY;  Surgeon: Wilford Corner, MD;  Location: [place];  Service: Endoscopy;  Laterality: N/A;  . LEFT HEART CATH AND CORONARY ANGIOGRAPHY N/A 12/28/2016   Procedure: Left Heart Cath and Coronary Angiography;  Surgeon: Larey Dresser, MD;  Location: [place] [facility];  Service: Cardiovascular;  Laterality: N/A;  . LEFT HEART CATHETERIZATION WITH CORONARY ANGIOGRAM N/A 12/19/2011   Procedure: LEFT HEART CATHETERIZATION WITH CORONARY ANGIOGRAM;  Surgeon: Larey Dresser, MD;  Location[facility] CATH LAB;  Service: Cardiovascular;  Laterality: N/A;  . OSTEOCHONDROMA EXCISION Left 1972   "took bone tumor off my shoulder"  . POLYPECTOMY  04/28/2019   Procedure: POLYPECTOMY;  Surgeon: Ronnette Juniper, MD;  Location: [facility] ENDOSCOPY;  Service: Gastroenterology;;  . PRESSURE SENSOR/CARDIOMEMS N/A 02/01/2020   Procedure: PRESSURE SENSOR/CARDIOMEMS;  Surgeon: Larey Dresser, MD;  Location: [place] [facility];  Service: Cardiovascular;  Laterality: N/A;  . RIGHT HEART CATH N/A 02/01/2020   Procedure: RIGHT  HEART CATH;  Surgeon: Larey Dresser, MD;  Location: [place] [facility];  Service: Cardiovascular;  Laterality: N/A;  . RIGHT/LEFT HEART CATH AND CORONARY ANGIOGRAPHY N/A 07/01/2019   Procedure: RIGHT/LEFT HEART CATH AND CORONARY ANGIOGRAPHY;  Surgeon: Larey Dresser, MD;  Location: [place] [facility];  Service: Cardiovascular;  Laterality: N/A;   Family History  Problem Relation Age of Onset  . Diabetes Mother   . Hypertension Mother   . Heart attack Neg Hx   . Stroke Neg Hx    Social History   Socioeconomic History  . Marital status: Widowed    Spouse name: Not on file  . Number of children: 2  . Years of education: 68  . Highest education level: Some college, no degree  Occupational History    Employer: UNEMPLOYED  Tobacco Use  . Smoking status: Former Smoker    Packs/day: 4.00    Years: 18.00    Pack years: 72.00    Types: Cigarettes    Quit date: 08/14/1983    Years since quitting: 36.9  . Smokeless tobacco: Never Used  Vaping Use  . Vaping Use: Never used  Substance and Sexual Activity  . Alcohol use: Yes    Alcohol/week: 16.0 standard drinks    Types: 6 Cans of beer, 10 Shots of liquor per week    Comment: occasionally  . Drug use: Yes    Types: Marijuana    Comment: twice a week  . Sexual activity: Never  Other Topics Concern  . Not on file  Social History Narrative  . Not on file   Social Determinants of Health   Financial Resource Strain: Low Risk   . Difficulty of Paying Living Expenses: Not hard at all  Food Insecurity: No Food Insecurity  . Worried About Charity fundraiser in the Last Year: Never true  . Ran Out of Food in the Last Year: Never true  Transportation Needs: No Transportation Needs  . Lack of Transportation (Medical): No  . Lack of Transportation (Non-Medical): No  Physical Activity: Inactive  . Days of Exercise per Week: 0 days  . Minutes of Exercise per Session: 0 min  Stress: No Stress Concern Present  . Feeling of Stress :  Not at all  Social Connections: Moderately Isolated  . Frequency of Communication with Friends and Family: More than three times a week  . Frequency of Social Gatherings with Friends and Family: More than three times a week  . Attends Religious Services: More than 4 times  per year  . Active Member of Clubs or Organizations: No  . Attends Archivist Meetings: Never  . Marital Status: Widowed    Tobacco Counseling Counseling given: Not Answered   Clinical Intake:  Pre-visit preparation completed: Yes  Pain : No/denies pain     Nutritional Risks: None Diabetes: Yes CBG done?: No Did pt. bring in CBG monitor from home?: No  How often do you need to have someone help you when you read instructions, pamphlets, or other written materials from your doctor or pharmacy?: 1 - Never What is the last grade level you completed in school?: 1.5 years of College  Diabetic?Yes Nutrition Risk Assessment:  Has the patient had any N/V/D within the last 2 months?  No  Does the patient have any non-healing wounds?  No  Has the patient had any unintentional weight loss or weight gain?  No   Diabetes:  Is the patient diabetic?  Yes  If diabetic, was a CBG obtained today?  No  Did the patient bring in their glucometer from home?  No  How often do you monitor your CBG's? Patient states checks glucose once per week.   Financial Strains and Diabetes Management:  Are you having any financial strains with the device, your supplies or your medication? No .  Does the patient want to be seen by Chronic Care Management for management of their diabetes?  No  Would the patient like to be referred to a Nutritionist or for Diabetic Management?  No   Diabetic Exams:  Diabetic Eye Exam: Completed 10/20/2019 Diabetic Foot Exam: Overdue, Pt has been advised about the importance in completing this exam. Pt is scheduled for diabetic foot exam on next in person office visit .   Interpreter  Needed?: No  Information entered by :: [place] of Daily Living In your present state of health, do you have any difficulty performing the following activities: 07/18/2020  Hearing? N  Vision? Y  Comment Occassionally gets blurred vision  Difficulty concentrating or making decisions? N  Walking or climbing stairs? Y  Comment Gets SOB with climbing of stairs  Dressing or bathing? N  Doing errands, shopping? N  Preparing Food and eating ? N  Using the Toilet? N  In the past six months, have you accidently leaked urine? N  Do you have problems with loss of bowel control? N  Managing your Medications? N  Managing your Finances? N  Housekeeping or managing your Housekeeping? N  Some recent data might be hidden    Patient Care Team: [name], MD as PCP - General (Family Medicine) Larey Dresser, MD as PCP - Cardiology (Cardiology)  Indicate any recent Medical Services you may have received from other than [facility] providers in the past year (date may be approximate).     Assessment:   This is a routine wellness examination for [place].  Hearing/Vision screen  Hearing Screening   _0  _1  _2  _3  _4  _5  _6  _7  _8   Right ear:           Left ear:           Vision Screening Comments: Patient states gets eyes checked once per year. Has c/o blurry vision at times    Dietary issues and exercise activities discussed: Current Exercise Habits: The patient does not participate in regular exercise at present, Exercise limited by: cardiac condition(s)  Goals    . Blood Pressure < 140/90    . Exercise 3x  per week (30 min per time)    . HEMOGLOBIN A1C < 7.0      Depression Screen PHQ 2/9 Scores 07/18/2020 08/20/2019 09/29/2018 07/08/2018 04/07/2018 01/28/2018 12/17/2017  PHQ - 2 Score 0 0 1 0 1 1 0  PHQ- 9 Score 0 - _0 Fall Risk Fall Risk  07/18/2020 09/29/2018 07/08/2018 12/17/2017 01/26/2016  Falls in the past year? 0 0 0 No No   Number falls in past yr: 0 - - - -  Injury with Fall? 0 - - - -  Risk for fall due to : Medication side effect - - - -  Follow up Falls evaluation completed;Falls prevention discussed - - - -    Any stairs in or around the home? No  If so, are there any without handrails? No  Home free of loose throw rugs in walkways, pet beds, electrical cords, etc? Yes  Adequate lighting in your home to reduce risk of falls? Yes   ASSISTIVE DEVICES UTILIZED TO PREVENT FALLS:  Life alert? No  Use of a cane, walker or w/c? No  Grab bars in the bathroom? No  Shower chair or bench in shower? No  Elevated toilet seat or a handicapped toilet? No   TIMED UP AND GO:  Was the test performed? Yes .  Length of time to ambulate 10 feet: 4 sec.   Gait steady and fast without use of assistive device  Cognitive Function:  Cognition within normal limits. Screening not indicated       Immunizations Immunization History  Administered Date(s) Administered  . Fluad Quad(high Dose 65+) 04/23/2019, 07/18/2020  . Influenza Split 07/28/2012  . Influenza Whole 06/30/2010  . Influenza,inj,Quad PF,6+ Mos 08/01/2013, 05/04/2015, 08/04/2016, 04/07/2018  . PFIZER SARS-COV-2 Vaccination 09/19/2019, 10/10/2019, 06/13/2020  . Pneumococcal Polysaccharide-23 03/06/2009  . Tdap 09/29/2018    TDAP status: Up to date Flu Vaccine status: Completed at today's visit Pneumococcal vaccine status: Due, Education has been provided regarding the importance of this vaccine. Advised may receive this vaccine at local pharmacy or Health Dept. Aware to provide a copy of the vaccination record if obtained from local pharmacy or Health Dept. Verbalized acceptance and understanding. Covid-19 vaccine status: Completed vaccines  Qualifies for Shingles Vaccine? Yes   Zostavax completed No   Shingrix Completed?: No.    Education has been provided regarding the importance of this vaccine. Patient has been advised to call insurance  company to determine out of pocket expense if they have not yet received this vaccine. Advised may also receive vaccine at local pharmacy or Health Dept. Verbalized acceptance and understanding.  Screening Tests Health Maintenance  Topic Date Due  . PNA vac Low Risk Adult (1 of 2 - PCV13) 04/03/2019  . FOOT EXAM  04/08/2019  . HEMOGLOBIN A1C  12/25/2019  . OPHTHALMOLOGY EXAM  10/19/2020  . TETANUS/TDAP  09/29/2028  . COLONOSCOPY  04/27/2029  . INFLUENZA VACCINE  Completed  . COVID-19 Vaccine  Completed  . Hepatitis C Screening  Completed    Health Maintenance  Health Maintenance Due  Topic Date Due  . PNA vac Low Risk Adult (1 of 2 - PCV13) 04/03/2019  . FOOT EXAM  04/08/2019  . HEMOGLOBIN A1C  12/25/2019    Colorectal cancer screening: Completed 04/28/2019. Repeat every 10 years  Lung Cancer Screening: (Low Dose CT Chest recommended if Age 11-80 years, 30 pack-year currently smoking OR have quit w/in 15years.) does not qualify.   Lung Cancer  Screening Referral: N/A   Additional Screening:  Hepatitis C Screening: does qualify; Completed 06/06/2015  Vision Screening: Recommended annual ophthalmology exams for early detection of glaucoma and other disorders of the eye. Is the patient up to date with their annual eye exam?  Yes  Who is the provider or what is the name of the office in which the patient attends annual eye exams? Dr. Gershon Crane  If pt is not established with a provider, would they like to be referred to a provider to establish care? No .   Dental Screening: Recommended annual dental exams for proper oral hygiene  Community Resource Referral / Chronic Care Management: CRR required this visit?  No   CCM required this visit?  No      Plan:     I have personally reviewed and noted the following in the patient's chart:   . Medical and social history . Use of alcohol, tobacco or illicit drugs  . Current medications and supplements . Functional ability and  status . Nutritional status . Physical activity . Advanced directives . List of other physicians . Hospitalizations, surgeries, and ER visits in previous 12 months . Vitals . Screenings to include cognitive, depression, and falls . Referrals and appointments  In addition, I have reviewed and discussed with patient certain preventive protocols, quality metrics, and best practice recommendations. A written personalized care plan for preventive services as well as general preventive health recommendations were provided to patient.     Ofilia Neas, LPN   [id number]   Nurse Notes: None

## 2020-07-15 NOTE — Patient Instructions (Addendum)
Labs done today. We will contact you only if your labs are abnormal.  STOP Taking Lasix  START Torsemide 40mg  (2 tablets) by mouth 2 times daily.  No other medication changes were made. Please continue all other medications as prescribed.  Your physician recommends that you schedule a follow-up appointment in: 10 days for a lab only appointment and in 6 weeks with [facility].  If you have any questions or concerns before your next appointment please send [place] a message through [place] or call our office at [phone].    TO LEAVE A MESSAGE FOR THE NURSE SELECT OPTION 2, PLEASE LEAVE A MESSAGE INCLUDING: . YOUR NAME . DATE OF BIRTH . CALL BACK NUMBER . REASON FOR CALL**this is important as we prioritize the call backs  [place] AS LONG AS YOU CALL BEFORE 4:00 PM  Your provider has recommended that you have a home sleep study.  This has to be approved by your insurance company. We will schedule you an appointment to pick up the equipment to give [place] time to complete this authorization. Once you have the equipment you will download the app on your phone and follow the instructions. YOUR PIN NUMBER IS: [id number]. Once you have completed the test the information is sent to the company through [employer] and you can dispose of the equipment. If your test is positive you will receive a call from Dr Theodosia Blender office [facility]) to set up your CPAP equipment.   Do the following things EVERYDAY: 1) Weigh yourself in the morning before breakfast. Write it down and keep it in a log. 2) Take your medicines as prescribed 3) Eat low salt foods--Limit salt (sodium) to 2000 mg per day.  4) Stay as active as you can everyday 5) Limit all fluids for the day to less than 2 liters   At the [facility], you and your health needs are our priority. As part of our continuing mission to provide you with exceptional heart care, we have created  designated Provider Care Teams. These Care Teams include your primary Cardiologist (physician) and Advanced Practice Providers (APPs- Physician Assistants and Nurse Practitioners) who all work together to provide you with the care you need, when you need it.   You may see any of the following providers on your designated Care Team at your next follow up: [name] Dr Glori Bickers . Dr Loralie Champagne . Darrick Grinder, NP . Lyda Jester, PA . Audry Riles, PharmD   Please be sure to bring in all your medications bottles to every appointment.

## 2020-07-15 NOTE — Progress Notes (Signed)
Patient Name: [name] [name]        DOB: February 08, 1956      Height: 6'0    Weight:209.8lbs  Office Name:[facility]         Referring Provider:Dr. Loralie Champagne  Today's Date:  07/15/2020  Date:  07/15/2020 STOP BANG RISK ASSESSMENT S (snore) Have you been told that you snore?     YES   T (tired) Are you often tired, fatigued, or sleepy during the day?   YES  O (obstruction) Do you stop breathing, choke, or gasp during sleep? NO   P (pressure) Do you have or are you being treated for high blood pressure? YES   B (BMI) Is your body index greater than 35 kg/m? NO   A (age) Are you 9 years old or older? YES   N (neck) Do you have a neck circumference greater than 16 inches?   YES/NO   G (gender) Are you a male? YES   TOTAL STOP/BANG "YES" ANSWERS 5                                                                       For Office Use Only              Procedure Order Form    YES to 3+ Stop Bang questions OR two clinical symptoms - patient qualifies for WatchPAT (CPT 95800)     Submit: This Form + Patient Face Sheet + Clinical Note via CloudPAT or Fax: [phone]         Clinical Notes: Will consult Sleep Specialist and refer for management of therapy due to patient increased risk of Sleep Apnea. Ordering a sleep study due to the following two clinical symptoms: Excessive daytime sleepiness G47.10 / Gastroesophageal reflux K21.9 / Nocturia R35.1 / Morning Headaches G44.221 / Difficulty concentrating R41.840 / Memory problems or poor judgment G31.84 / Personality changes or irritability R45.4 / Loud snoring R06.83 / Depression F32.9 / Unrefreshed by sleep G47.8 / Impotence N52.9 / History of high blood pressure R03.0 / Insomnia G47.00    I understand that I am proceeding with a home sleep apnea test as ordered by my treating physician. I understand that untreated sleep apnea is a serious cardiovascular risk factor and it is my responsibility to perform the test  and seek management for sleep apnea. I will be contacted with the results and be managed for sleep apnea by a local sleep physician. I will be receiving equipment and further instructions from [facility]. I shall promptly ship back the equipment via the included mailing label. I understand my insurance will be billed for the test and as the patient I am responsible for any insurance related out-of-pocket costs incurred. I have been provided with written instructions and can call for additional video or telephonic instruction, with 24-hour availability of qualified personnel to answer any questions: Patient Help Desk [phone].  Patient Signature ______________________________________________________   Date______________________ Patient Telemedicine Verbal Consent

## 2020-07-16 NOTE — Progress Notes (Signed)
Patient ID: [name] [name], male   DOB: July 20, 1956, 64 y.o.   MRN: [id number] PCP: [name], MD Cardiology: Dr. Aundra Dubin  64 y.o. with history of HTN, diabetes, paroxysmal atrial fibrillation, and CAD presents for cardiology followup.  He was admitted in 5/13 with chest pain with exertion.  LHC was done showing global HK with EF 45% and diffuse, severe distal and branch vessel disease.  There was no interventional option, but it was suspect that this disease could be causing his anginal-type pain.  Echo at that time was read as showing EF 60-65%.  He was admitted in 5/14 with hypertensive urgency and chest pain.  He ruled out for MI and BP was controlled.  His EF was 50-55% by echo.  ETT-Sestamibi done as outpatient showed small fixed inferior defect with no ischemia but EF was 31%.  He was quite hypertensive during the study.  Cardiac MRI done to confirm EF in 5/14 showed EF 44%.  He had an episode of atrial fibrillation/RVR in 3/15 but went back into NSR.  Echo in 2/16 showed EF 55-60% with moderate diastolic dysfunction.   He had a Cardiolite in 9/16 showing inferior ischemia.  He was having exertional dyspnea with less activity than normal.  There was concern for worsened CAD and he was sent for [facility] in 9/16. By RHC, filling pressures were not significantly elevated. He had a severe distal RCA stenosis that was treated with DES x 2.  Echo showed EF 55-60%.    In 10/16, he was admitted with acute cholecystitis.  He received a cholecystostomy tube.  Cholecystectomy done in 6/17.   Atrial fibrillation in 1/17, cardioverted to NSR.   Recurrent concerning chest pain in 3/17.  Repeat LHC showed diffuse distal and branch vessel disease consistent with poorly-controlled DM, but patent RCA stents.    He was admitted with atrial fibrillation in 12/17.  Started on sotalol and converted to NSR.    He had recurrent chest pain in 5/18 with abnormal Cardiolite.  Cath was done again in 5/18,  showing diffuse CAD with no good interventional options.   Admitted 11/20 with increased dyspnea and exertional chest pain. Hgb went down to 8.1. GI consulted.  Had [place] that showed severe distal and branch vessel disease but no interventional targets and not good anatomy for CABG. Continued on medical management. Capsule endoscopy without recent or active bleeding, no AVMs. He went into atrial flutter during his hospitalization. He went back into NSR, however, after discharge.  +Snoring and daytime sleepiness.   He has had episodes of syncope associated with vigorous coughing.   He was in the ER with atrial flutter and dyspnea in 4/21, back in NSR today on sotalol.    Patient returns for followup of CHF.  Weight is up 2 lbs.  He has not sent in a Cardiomems reading since 11/13, it was high at 66 on that day.  He is short of breath walking about 100 feet. No problems walking around the house. He was having episodes of chest pain about a week ago.  He took extra Lasix and this resolved, no further chest pain. He chronically sleeps on 2 pillows.    ECG (personally reviewed): NSR, 1st degree AVB, inferior TWIs, QTc 477 msec  Labs (5/13): K 3.6, creatinine 0.93, LDL 85, HDL 57 Labs (7/13): K 3.9, creatinine 1.1, LDL 57, HDL 51  Labs (5/14): K 4, creatinine 1.25 Labs (8/14): K 3.7, creatinine 0.9, LDL 103, HDL 42 Labs (8/15):  K 3.5 creatinine 1.0, BNP 75 Labs (2/16): K 3.8, creatinine 1.1 Labs (9/16): K 3.5, creatinine 1.03, BNP 59, HDL 40, LDL 111, TGs 333 Labs (11/16): K 3.6, creaitnine 1.07, LFTs normal, LDL 44 Labs (6/17): K 3.4, creatinine 1.52 => 1.13 Labs (1/18): K 3.9, creatinine 1.25 Labs (6/18): K 3.8, creatinine 1.21, HCT 39.7 Labs (8/18): LDL 39, HDL 43 Labs (12/18): 4.4, creatinine 1.25, LFTs normal Labs (8/19): K 3.6, creatinine 1.13, LDL 68, HDL 49  Labs (11/20): K 3.5, creatinine 1.33, LDL 25 Labs (4/21): K 4.0, SCr 1.14, BNP 266  Labs (6/21): K 3.7, creatinine 1.25  PMH: 1.  DM2 2. Gout 3. HTN: Cough with ACEI.  4. Cardiomyopathy: Suspect mixed ischemic/nonischemic (?ETOH-related).  EF 35% in 2008.  Echo in 5/13 showed EF 60-65% with moderate LVH but EF was 45% on LV-gram in 5/13.  Echo (5/14) with EF 50-55%, mild LVH, inferobasal HK, mild MR.  ETT-Sestamibi in 5/14, however, showed EF 31%.  Cardiac MRI (6/14) with EF 44%, mild global hypokinesis, subepicardial delayed enhancement in a nonspecific RV insertion site pattern.  Echo (2/16) with EF 55-60%, moderate diastolic dysfunction. RHC (9/16) with mean RA 8, PA 39/20 mean 28, mean PCWP 14, CI 2.2. Echo (9/16) with EF 55-60%, grade II diastolic dysfunction.  - Echo (6/17) with EF 55-60%, moderate LVH, grade II diastolic dysfunction, mild AI, mild MR.  - RHC (11/20): mean RA 4, PA 46/14 mean 26, mean PCWP 13, CI 3.23 - Echo (9/20): EF 60-65%, moderate LVH, normal RV size and systolic function, mild-moderate MR, mild AI.  - Echo (5/21): EF 55%, RV normal - Has Cardiomems.  5. CAD: LHC in 2010 with mild nonobstructive disease.  LHC (5/13) with diffuse distal and branch vessel disease, mild global hypokinesis and EF 45%.  ETT-Sestamibi (5/14): EF 31%, small fixed inferior defect with no ischemia.  Lexiscan Cardiolite (9/16) with EF 26%, moderate inferior defect that was partially reversible, suggesting ischemia => High risk study. LHC (9/16) with 40-50% mLAD, diffuse up to 50% distal LAD, 90% ostium of branch off ramus, 95% dRCA => DES to RCA x 2, EF 55-60%.   - LHC (3/17) with diffuse branch and distal vessel disease c/w poorly controlled DM, RCA stents patent.  - LHC (5/18): Diffuse CAD with no good interventional option. 60% mRCA, 90% dLAD, 50% serial mid ramus stenoses, ramus branches with ostial 80-90% stenoses. - LHC (11/20): Severe distal and branch vessel disease but no interventional targets and not good anatomy for CABG  6. H/o chronic HBV 7. Osteochondroma left shoulder.  8. Hyperlipidemia 9. Paroxysmal atrial  fibrillation: Coumadin.  Developed cough and increased ESR with amiodarone.  DCCV in 1/17.  Recurrent atrial fibrillation in 12/17, sotalol started.  10. Atrial flutter: had ablations in 2007 and 2008.  - Zio patch in 4/21 with 20% atrial flutter.  11. OSA on CPAP.  12. Acute cholecystitis (10/16): Cholecystostomy tube placed.  Cholecystectomy 6/17.  13. CKD 14. Anemia - Suspected GI bleed but EGD/c-scope 9/20 without significant findings.  - Capsule Endoscopy (11/20) without recent or active bleeding, no AVMs.  15. Syncope: Suspected cough syncope.   SH: Married, prior smoker.  Some ETOH, occasionally heavy in past.  Does use occasional marijuana. Out of work Engineer, drilling. 2 daughters in grad school.   FH: CAD  ROS: All systems reviewed and negative except as per HPI.   Current Outpatient Medications  Medication Sig Dispense Refill  . ACCU-CHEK SOFTCLIX LANCETS lancets Use as instructed for three  times daily testing of blood glucose 100 each 12  . acetaminophen (TYLENOL) 500 MG tablet Take 1,000 mg by mouth every 6 (six) hours as needed (pain).    [name] albuterol (VENTOLIN HFA) 108 (90 Base) MCG/ACT inhaler Inhale 1-2 puffs into the lungs every 6 (six) hours as needed for wheezing or shortness of breath. 8 g 0  . amLODipine (NORVASC) 5 MG tablet Take 1 tablet by mouth once daily 90 tablet 3  . apixaban (ELIQUIS) 5 MG TABS tablet Take 1 tablet (5 mg total) by mouth 2 (two) times daily. 60 tablet 3  . atorvastatin (LIPITOR) 80 MG tablet Take 1 tablet by mouth once daily 90 tablet 3  . Blood Glucose Monitoring Suppl (ACCU-CHEK AVIVA PLUS) w/Device KIT Use as directed for 3 times daily testing of blood glucose 1 kit 0  . carvedilol (COREG) 25 MG tablet TAKE 1 TABLET BY MOUTH TWICE DAILY WITH A MEAL 180 tablet 3  . empagliflozin (JARDIANCE) 10 MG TABS tablet Take 1 tablet (10 mg total) by mouth daily before breakfast. 30 tablet 4  . glucose blood (ACCU-CHEK AVIVA PLUS) test strip Use as instructed  for 3 times daily testing of blood glucose 100 each 12  . guaiFENesin (MUCINEX PO) Take 1 tablet by mouth 2 (two) times daily as needed (cough/congestion).    . hydrALAZINE (APRESOLINE) 50 MG tablet TAKE 1 & 1/2 (ONE & ONE-HALF) TABLETS BY MOUTH TWICE DAILY 90 tablet 5  . insulin glargine (LANTUS SOLOSTAR) 100 UNIT/ML Solostar Pen Inject 35 Units into the skin every morning. And pen needles 1/day 30 mL 3  . isosorbide mononitrate (IMDUR) 120 MG 24 hr tablet Take 1 tablet by mouth once daily 30 tablet 0  . Lancet Devices (ACCU-CHEK SOFTCLIX) lancets Use as instructed for 3 times daily testing of blood glucose 1 each 0  . metFORMIN (GLUCOPHAGE) 1000 MG tablet TAKE 1 TABLET BY MOUTH TWICE DAILY WITH A MEAL 60 tablet 3  . nitroGLYCERIN (NITROSTAT) 0.4 MG SL tablet Place 1 tablet (0.4 mg total) under the tongue every 5 (five) minutes as needed for chest pain (x 3 tabs daily). 60 tablet 1  . sotalol (BETAPACE) 120 MG tablet Take 1 tablet (120 mg total) by mouth 2 (two) times daily. 180 tablet 3  . TRUEPLUS LANCETS 28G MISC 1 each by Does not apply route 3 (three) times daily. 100 each 12  . torsemide (DEMADEX) 20 MG tablet Take 2 tablets (40 mg total) by mouth 2 (two) times daily. 360 tablet 3   No current facility-administered medications for this encounter.    BP 138/72   Pulse 74   Ht 6' (1.829 m)   Wt 95.2 kg (209 lb 12.8 oz)   SpO2 98%   BMI 28.45 kg/m   Wt Readings from Last 3 Encounters:  07/15/20 95.2 kg (209 lb 12.8 oz)  04/05/20 94.1 kg (207 lb 6.4 oz)  02/09/20 91.6 kg (202 lb)   PHYSICAL EXAM: General: NAD Neck: JVP 8-9 cm, no thyromegaly or thyroid nodule.  Lungs: Clear to auscultation bilaterally with normal respiratory effort. CV: Nondisplaced PMI.  Heart regular S1/S2, no S3/S4, no murmur.  No peripheral edema.  No carotid bruit.  Normal pedal pulses.  Abdomen: Soft, nontender, no hepatosplenomegaly, no distention.  Skin: Intact without lesions or rashes.  Neurologic:  Alert and oriented x 3.  Psych: Normal affect. Extremities: No clubbing or cyanosis.  HEENT: Normal.   Assessment/Plan:   1. Chronic diastolic CHF: EF 70% on  cardiac MRI in 6/14 but EF improved back to 55-60% on 2/16 echo.  However, EF down to 26% on Cardiolite in 9/16.  Echo in 9/20 showed EF 60-65%, moderate LVH, normal RV, mild-moderate MR.  Echo in 5/21 with EF 55%, normal RV.  He has a Cardiomems device but has not been sending readings in regularly.  He appears volume overloaded on exam, NYHA class II-III symptoms.  - Stop Lasix, start torsemide 40 mg bid.  - I encouraged him to send Cardiomems daily.   - BMET today and again in 10 days.  2. Atrial fibrillation/flutter: Paroxysmal. He has had atrial flutter ablations x 2 remotely.  He is on sotalol.  QTc ok on ECG today.  He tolerates atrial arrhythmias poorly, seems to worsen CHF.  Zio patch in 4/21 with 20% atrial flutter. He saw EP, repeat ablation was not recommended. He is in NSR today, no palpitations.  - Continue sotalol.  - Suspect untreated OSA may be contributing. Will need sleep study.  - Continue Eliquis.     3. Coronary artery disease: LHC in 9/16 showed diffuse CAD, most significant involving the distal RCA.  Patient had DES x 2 to RCA.  Cath in 3/17 with patent RCA stents and diffuse distal and branch vessel disease concerning for poorly controlled diabetes.  Cath in 5/18 again showed diffuse CAD without good interventional target.  Cath in 11/20 showed severe distal and branch vessel disease but no interventional targets and not good anatomy for CABG.  No chest pain currently.  - No ASA given Eliquis use.  - Continue atorvastatin 80 mg daily, check lipids today.  - Continue Coreg, amlodipine, and Imdur 120 mg daily for angina control.  - Avoid ranolazine with sotalol use.  4. Hyperlipidemia:  On atorvastatin, check lipids today.  5. Hypertension: Controlled on current regimen.   6. OSA: not on CPAP. Did not tolerate  previously. We discussed CV affects of untreated OSA including potential for worsening HF and recurrent atrial arrhthymias. He is willing to retry CPAP. Will order home sleep study for repeat assessment (daytime sleepiness, snoring).  7. CKD: BMET today.   Followup 6 wks with NP/PA to reassess volume.  Loralie Champagne 07/16/2020

## 2020-07-18 ENCOUNTER — Ambulatory Visit (INDEPENDENT_AMBULATORY_CARE_PROVIDER_SITE_OTHER): Payer: Medicare Other

## 2020-07-18 ENCOUNTER — Other Ambulatory Visit: Payer: Self-pay

## 2020-07-18 VITALS — BP 116/64 | HR 76 | Temp 98.9°F | Ht 72.0 in | Wt 210.6 lb

## 2020-07-18 DIAGNOSIS — Z0000 Encounter for general adult medical examination without abnormal findings: Secondary | ICD-10-CM | POA: Diagnosis not present

## 2020-07-18 DIAGNOSIS — Z23 Encounter for immunization: Secondary | ICD-10-CM | POA: Diagnosis not present

## 2020-07-18 NOTE — Patient Instructions (Signed)
[name] , Thank you for taking time to come for your Medicare Wellness Visit. I appreciate your ongoing commitment to your health goals. Please review the following plan we discussed and let me know if I can assist you in the future.   Screening recommendations/referrals: Colonoscopy: Up to date, next due 04/27/2029 Recommended yearly ophthalmology/optometry visit for glaucoma screening and checkup Recommended yearly dental visit for hygiene and checkup  Vaccinations: Influenza vaccine: Up to date, next due fall 2022 Pneumococcal vaccine: Currently due for Prevnar 13 you may receive at your next in person office visit  Tdap vaccine: Up to date, next due 09/29/2028 Shingles vaccine: Currently due for Shingrix, if you wish to receive we recommend that you do so at your local pharmacy     Advanced directives: Advance directive discussed with you today. Even though you declined this today please call our office should you change your mind and we can give you the proper paperwork for you to fill out.   Conditions/risks identified: None   Next appointment: 07/20/2021 @ 1:00 PM with Dr. Volanda Napoleon  Preventive Care 66 Years and Older, Male Preventive care refers to lifestyle choices and visits with your health care provider that can promote health and wellness. What does preventive care include?  A yearly physical exam. This is also called an annual well check.  Dental exams once or twice a year.  Routine eye exams. Ask your health care provider how often you should have your eyes checked.  Personal lifestyle choices, including:  Daily care of your teeth and gums.  Regular physical activity.  Eating a healthy diet.  Avoiding tobacco and drug use.  Limiting alcohol use.  Practicing safe sex.  Taking low doses of aspirin every day.  Taking vitamin and mineral supplements as recommended by your health care provider. What happens during an annual well check? The services and  screenings done by your health care provider during your annual well check will depend on your age, overall health, lifestyle risk factors, and family history of disease. Counseling  Your health care provider may ask you questions about your:  Alcohol use.  Tobacco use.  Drug use.  Emotional well-being.  Home and relationship well-being.  Sexual activity.  Eating habits.  History of falls.  Memory and ability to understand (cognition).  Work and work Statistician. Screening  You may have the following tests or measurements:  Height, weight, and BMI.  Blood pressure.  Lipid and cholesterol levels. These may be checked every 5 years, or more frequently if you are over 73 years old.  Skin check.  Lung cancer screening. You may have this screening every year starting at age 71 if you have a 30-pack-year history of smoking and currently smoke or have quit within the past 15 years.  Fecal occult blood test (FOBT) of the stool. You may have this test every year starting at age 12.  Flexible sigmoidoscopy or colonoscopy. You may have a sigmoidoscopy every 5 years or a colonoscopy every 10 years starting at age 69.  Prostate cancer screening. Recommendations will vary depending on your family history and other risks.  Hepatitis C blood test.  Hepatitis B blood test.  Sexually transmitted disease (STD) testing.  Diabetes screening. This is done by checking your blood sugar (glucose) after you have not eaten for a while (fasting). You may have this done every 1-3 years.  Abdominal aortic aneurysm (AAA) screening. You may need this if you are a current or former smoker.  Osteoporosis. You may be screened starting at age 31 if you are at high risk. Talk with your health care provider about your test results, treatment options, and if necessary, the need for more tests. Vaccines  Your health care provider may recommend certain vaccines, such as:  Influenza vaccine. This is  recommended every year.  Tetanus, diphtheria, and acellular pertussis (Tdap, Td) vaccine. You may need a Td booster every 10 years.  Zoster vaccine. You may need this after age 56.  Pneumococcal 13-valent conjugate (PCV13) vaccine. One dose is recommended after age 2.  Pneumococcal polysaccharide (PPSV23) vaccine. One dose is recommended after age 37. Talk to your health care provider about which screenings and vaccines you need and how often you need them. This information is not intended to replace advice given to you by your health care provider. Make sure you discuss any questions you have with your health care provider. Document Released: 08/26/2015 Document Revised: 04/18/2016 Document Reviewed: 05/31/2015 Elsevier Interactive Patient Education  2017 [place] Prevention in the Home Falls can cause injuries. They can happen to people of all ages. There are many things you can do to make your home safe and to help prevent falls. What can I do on the outside of my home?  Regularly fix the edges of walkways and driveways and fix any cracks.  Remove anything that might make you trip as you walk through a door, such as a raised step or threshold.  Trim any bushes or trees on the path to your home.  Use bright outdoor lighting.  Clear any walking paths of anything that might make someone trip, such as rocks or tools.  Regularly check to see if handrails are loose or broken. Make sure that both sides of any steps have handrails.  Any raised decks and porches should have guardrails on the edges.  Have any leaves, snow, or ice cleared regularly.  Use sand or salt on walking paths during winter.  Clean up any spills in your garage right away. This includes oil or grease spills. What can I do in the bathroom?  Use night lights.  Install grab bars by the toilet and in the tub and shower. Do not use towel bars as grab bars.  Use non-skid mats or decals in the tub or  shower.  If you need to sit down in the shower, use a plastic, non-slip stool.  Keep the floor dry. Clean up any water that spills on the floor as soon as it happens.  Remove soap buildup in the tub or shower regularly.  Attach bath mats securely with double-sided non-slip rug tape.  Do not have throw rugs and other things on the floor that can make you trip. What can I do in the bedroom?  Use night lights.  Make sure that you have a light by your bed that is easy to reach.  Do not use any sheets or blankets that are too big for your bed. They should not hang down onto the floor.  Have a firm chair that has side arms. You can use this for support while you get dressed.  Do not have throw rugs and other things on the floor that can make you trip. What can I do in the kitchen?  Clean up any spills right away.  Avoid walking on wet floors.  Keep items that you use a lot in easy-to-reach places.  If you need to reach something above you, use a strong step  stool that has a grab bar.  Keep electrical cords out of the way.  Do not use floor polish or wax that makes floors slippery. If you must use wax, use non-skid floor wax.  Do not have throw rugs and other things on the floor that can make you trip. What can I do with my stairs?  Do not leave any items on the stairs.  Make sure that there are handrails on both sides of the stairs and use them. Fix handrails that are broken or loose. Make sure that handrails are as long as the stairways.  Check any carpeting to make sure that it is firmly attached to the stairs. Fix any carpet that is loose or worn.  Avoid having throw rugs at the top or bottom of the stairs. If you do have throw rugs, attach them to the floor with carpet tape.  Make sure that you have a light switch at the top of the stairs and the bottom of the stairs. If you do not have them, ask someone to add them for you. What else can I do to help prevent  falls?  Wear shoes that:  Do not have high heels.  Have rubber bottoms.  Are comfortable and fit you well.  Are closed at the toe. Do not wear sandals.  If you use a stepladder:  Make sure that it is fully opened. Do not climb a closed stepladder.  Make sure that both sides of the stepladder are locked into place.  Ask someone to hold it for you, if possible.  Clearly mark and make sure that you can see:  Any grab bars or handrails.  First and last steps.  Where the edge of each step is.  Use tools that help you move around (mobility aids) if they are needed. These include:  Canes.  Walkers.  Scooters.  Crutches.  Turn on the lights when you go into a dark area. Replace any light bulbs as soon as they burn out.  Set up your furniture so you have a clear path. Avoid moving your furniture around.  If any of your floors are uneven, fix them.  If there are any pets around you, be aware of where they are.  Review your medicines with your doctor. Some medicines can make you feel dizzy. This can increase your chance of falling. Ask your doctor what other things that you can do to help prevent falls. This information is not intended to replace advice given to you by your health care provider. Make sure you discuss any questions you have with your health care provider. Document Released: 05/26/2009 Document Revised: 01/05/2016 Document Reviewed: 09/03/2014 Elsevier Interactive Patient Education  2017 [employer].

## 2020-07-20 ENCOUNTER — Ambulatory Visit (INDEPENDENT_AMBULATORY_CARE_PROVIDER_SITE_OTHER): Payer: Medicare Other | Admitting: Family Medicine

## 2020-07-20 ENCOUNTER — Other Ambulatory Visit: Payer: Self-pay

## 2020-07-20 ENCOUNTER — Encounter: Payer: Self-pay | Admitting: Family Medicine

## 2020-07-20 VITALS — BP 120/70 | HR 80 | Temp 98.5°F | Wt 210.8 lb

## 2020-07-20 DIAGNOSIS — E1169 Type 2 diabetes mellitus with other specified complication: Secondary | ICD-10-CM | POA: Diagnosis not present

## 2020-07-20 DIAGNOSIS — Z794 Long term (current) use of insulin: Secondary | ICD-10-CM

## 2020-07-20 DIAGNOSIS — K429 Umbilical hernia without obstruction or gangrene: Secondary | ICD-10-CM

## 2020-07-20 DIAGNOSIS — R140 Abdominal distension (gaseous): Secondary | ICD-10-CM | POA: Diagnosis not present

## 2020-07-20 DIAGNOSIS — R630 Anorexia: Secondary | ICD-10-CM | POA: Diagnosis not present

## 2020-07-20 DIAGNOSIS — R195 Other fecal abnormalities: Secondary | ICD-10-CM

## 2020-07-20 DIAGNOSIS — I10 Essential (primary) hypertension: Secondary | ICD-10-CM

## 2020-07-20 DIAGNOSIS — Z125 Encounter for screening for malignant neoplasm of prostate: Secondary | ICD-10-CM

## 2020-07-20 LAB — POCT GLYCOSYLATED HEMOGLOBIN (HGB A1C): Hemoglobin A1C: 9.8 % — AB (ref 4.0–5.6)

## 2020-07-20 LAB — MICROALBUMIN / CREATININE URINE RATIO
Creatinine,U: 39.7 mg/dL
Microalb Creat Ratio: 4.3 mg/g (ref 0.0–30.0)
Microalb, Ur: 1.7 mg/dL (ref 0.0–1.9)

## 2020-07-20 LAB — GLUCOSE, POCT (MANUAL RESULT ENTRY): POC Glucose: 247 mg/dl — AB (ref 70–99)

## 2020-07-20 MED ORDER — BLOOD GLUCOSE MONITOR KIT: PACK | 0 refills | Status: DC

## 2020-07-20 NOTE — Patient Instructions (Signed)
Abdominal Bloating When you have abdominal bloating, your abdomen may feel full, tight, or painful. It may also look bigger than normal or swollen (distended). Common causes of abdominal bloating include:  Swallowing air.  Constipation.  Problems digesting food.  Eating too much.  Irritable bowel syndrome. This is a condition that affects the large intestine.  Lactose intolerance. This is an inability to digest lactose, a natural sugar in dairy products.  Celiac disease. This is a condition that affects the ability to digest gluten, a protein found in some grains.  Gastroparesis. This is a condition that slows down the movement of food in the stomach and small intestine. It is more common in people with diabetes mellitus.  Gastroesophageal reflux disease (GERD). This is a digestive condition that makes stomach acid flow back into the esophagus.  Urinary retention. This means that the body is holding onto urine, and the bladder cannot be emptied all the way. Follow these instructions at home: Eating and drinking  Avoid eating too much.  Try not to swallow air while talking or eating.  Avoid eating while lying down.  Avoid these foods and drinks: ? Foods that cause gas, such as broccoli, cabbage, cauliflower, and baked beans. ? Carbonated drinks. ? Hard candy. ? Chewing gum. Medicines  Take over-the-counter and prescription medicines only as told by your health care provider.  Take probiotic medicines. These medicines contain live bacteria or yeasts that can help digestion.  Take coated peppermint oil capsules. Activity  Try to exercise regularly. Exercise may help to relieve bloating that is caused by gas and relieve constipation. General instructions  Keep all follow-up visits as told by your health care provider. This is important. Contact a health care provider if:  You have nausea and vomiting.  You have diarrhea.  You have abdominal pain.  You have unusual  weight loss or weight gain.  You have severe pain, and medicines do not help. Get help right away if:  You have severe chest pain.  You have trouble breathing.  You have shortness of breath.  You have trouble urinating.  You have darker urine than normal.  You have blood in your stools or have dark, tarry stools. Summary  Abdominal bloating means that the abdomen is swollen.  Common causes of abdominal bloating are swallowing air, constipation, and problems digesting food.  Avoid eating too much and avoid swallowing air.  Avoid foods that cause gas, carbonated drinks, hard candy, and chewing gum. This information is not intended to replace advice given to you by your health care provider. Make sure you discuss any questions you have with your health care provider. Document Revised: 11/17/2018 Document Reviewed: 08/31/2016 Elsevier Patient Education  [place].  Diabetes Mellitus and [place] care is an important part of your health, especially when you have diabetes. Diabetes may cause you to have problems because of poor blood flow (circulation) to your feet and legs, which can cause your skin to:  Become thinner and drier.  Break more easily.  Heal more slowly.  Peel and crack. You may also have nerve damage (neuropathy) in your legs and feet, causing decreased feeling in them. This means that you may not notice minor injuries to your feet that could lead to more serious problems. Noticing and addressing any potential problems early is the best way to prevent future foot problems. How to care for your feet Foot hygiene  Wash your feet daily with warm water and mild soap. Do not  use hot water. Then, pat your feet and the areas between your toes until they are completely dry. Do not soak your feet as this can dry your skin.  Trim your toenails straight across. Do not dig under them or around the cuticle. File the edges of your nails with an emery board or  nail file.  Apply a moisturizing lotion or petroleum jelly to the skin on your feet and to dry, brittle toenails. Use lotion that does not contain alcohol and is unscented. Do not apply lotion between your toes. Shoes and socks  Wear clean socks or stockings every day. Make sure they are not too tight. Do not wear knee-high stockings since they may decrease blood flow to your legs.  Wear shoes that fit properly and have enough cushioning. Always look in your shoes before you put them on to be sure there are no objects inside.  To break in new shoes, wear them for just a few hours a day. This prevents injuries on your feet. Wounds, scrapes, corns, and calluses  Check your feet daily for blisters, cuts, bruises, sores, and redness. If you cannot see the bottom of your feet, use a mirror or ask someone for help.  Do not cut corns or calluses or try to remove them with medicine.  If you find a minor scrape, cut, or break in the skin on your feet, keep it and the skin around it clean and dry. You may clean these areas with mild soap and water. Do not clean the area with peroxide, alcohol, or iodine.  If you have a wound, scrape, corn, or callus on your foot, look at it several times a day to make sure it is healing and not infected. Check for: ? Redness, swelling, or pain. ? Fluid or blood. ? Warmth. ? Pus or a bad smell. General instructions  Do not cross your legs. This may decrease blood flow to your feet.  Do not use heating pads or hot water bottles on your feet. They may burn your skin. If you have lost feeling in your feet or legs, you may not know this is happening until it is too late.  Protect your feet from hot and cold by wearing shoes, such as at the beach or on hot pavement.  Schedule a complete foot exam at least once a year (annually) or more often if you have foot problems. If you have foot problems, report any cuts, sores, or bruises to your health care provider  immediately. Contact a health care provider if:  You have a medical condition that increases your risk of infection and you have any cuts, sores, or bruises on your feet.  You have an injury that is not healing.  You have redness on your legs or feet.  You feel burning or tingling in your legs or feet.  You have pain or cramps in your legs and feet.  Your legs or feet are numb.  Your feet always feel cold.  You have pain around a toenail. Get help right away if:  You have a wound, scrape, corn, or callus on your foot and: ? You have pain, swelling, or redness that gets worse. ? You have fluid or blood coming from the wound, scrape, corn, or callus. ? Your wound, scrape, corn, or callus feels warm to the touch. ? You have pus or a bad smell coming from the wound, scrape, corn, or callus. ? You have a fever. ? You have a red  line going up your leg. Summary  Check your feet every day for cuts, sores, red spots, swelling, and blisters.  Moisturize feet and legs daily.  Wear shoes that fit properly and have enough cushioning.  If you have foot problems, report any cuts, sores, or bruises to your health care provider immediately.  Schedule a complete foot exam at least once a year (annually) or more often if you have foot problems. This information is not intended to replace advice given to you by your health care provider. Make sure you discuss any questions you have with your health care provider. Document Revised: 04/22/2019 Document Reviewed: 08/31/2016 Elsevier Patient Education  [place].  Diabetes Basics  Diabetes (diabetes mellitus) is a long-term (chronic) disease. It occurs when the body does not properly use sugar (glucose) that is released from food after you eat. Diabetes may be caused by one or both of these problems:  Your pancreas does not make enough of a hormone called insulin.  Your body does not react in a normal way to insulin that it  makes. Insulin lets sugars (glucose) go into cells in your body. This gives you energy. If you have diabetes, sugars cannot get into cells. This causes high blood sugar (hyperglycemia). Follow these instructions at home: How is diabetes treated? You may need to take insulin or other diabetes medicines daily to keep your blood sugar in balance. Take your diabetes medicines every day as told by your doctor. List your diabetes medicines here: Diabetes medicines  Name of medicine: ______________________________ ? Amount (dose): _______________ Time (a.m./p.m.): _______________ Notes: ___________________________________  Name of medicine: ______________________________ ? Amount (dose): _______________ Time (a.m./p.m.): _______________ Notes: ___________________________________  Name of medicine: ______________________________ ? Amount (dose): _______________ Time (a.m./p.m.): _______________ Notes: ___________________________________ If you use insulin, you will learn how to give yourself insulin by injection. You may need to adjust the amount based on the food that you eat. List the types of insulin you use here: Insulin  Insulin type: ______________________________ ? Amount (dose): _______________ Time (a.m./p.m.): _______________ Notes: ___________________________________  Insulin type: ______________________________ ? Amount (dose): _______________ Time (a.m./p.m.): _______________ Notes: ___________________________________  Insulin type: ______________________________ ? Amount (dose): _______________ Time (a.m./p.m.): _______________ Notes: ___________________________________  Insulin type: ______________________________ ? Amount (dose): _______________ Time (a.m./p.m.): _______________ Notes: ___________________________________  Insulin type: ______________________________ ? Amount (dose): _______________ Time (a.m./p.m.): _______________ Notes:  ___________________________________ How do I manage my blood sugar?  Check your blood sugar levels using a blood glucose monitor as directed by your doctor. Your doctor will set treatment goals for you. Generally, you should have these blood sugar levels:  Before meals (preprandial): 80-130 mg/dL (4.4-7.2 mmol/L).  After meals (postprandial): below 180 mg/dL (10 mmol/L).  A1c level: less than 7%. Write down the times that you will check your blood sugar levels: Blood sugar checks  Time: _______________ Notes: ___________________________________  Time: _______________ Notes: ___________________________________  Time: _______________ Notes: ___________________________________  Time: _______________ Notes: ___________________________________  Time: _______________ Notes: ___________________________________  Time: _______________ Notes: ___________________________________  What do I need to know about low blood sugar? Low blood sugar is called hypoglycemia. This is when blood sugar is at or below 70 mg/dL (3.9 mmol/L). Symptoms may include:  Feeling: ? Hungry. ? Worried or nervous (anxious). ? Sweaty and clammy. ? Confused. ? Dizzy. ? Sleepy. ? Sick to your stomach (nauseous).  Having: ? A fast heartbeat. ? A headache. ? A change in your vision. ? Tingling or no feeling (numbness) around the mouth, lips, or tongue. ? Jerky movements  that you cannot control (seizure).  Having trouble with: ? Moving (coordination). ? Sleeping. ? Passing out (fainting). ? Getting upset easily (irritability). Treating low blood sugar To treat low blood sugar, eat or drink something sugary right away. If you can think clearly and swallow safely, follow the 15:15 rule:  Take 15 grams of a fast-acting carb (carbohydrate). Talk with your doctor about how much you should take.  Some fast-acting carbs are: ? Sugar tablets (glucose pills). Take 3-4 glucose pills. ? 6-8 pieces of hard  candy. ? 4-6 oz (120-150 mL) of fruit juice. ? 4-6 oz (120-150 mL) of regular (not diet) soda. ? 1 Tbsp (15 mL) honey or sugar.  Check your blood sugar 15 minutes after you take the carb.  If your blood sugar is still at or below 70 mg/dL (3.9 mmol/L), take 15 grams of a carb again.  If your blood sugar does not go above 70 mg/dL (3.9 mmol/L) after 3 tries, get help right away.  After your blood sugar goes back to normal, eat a meal or a snack within 1 hour. Treating very low blood sugar If your blood sugar is at or below 54 mg/dL (3 mmol/L), you have very low blood sugar (severe hypoglycemia). This is an emergency. Do not wait to see if the symptoms will go away. Get medical help right away. Call your local emergency services (911 in the [place] Do not drive yourself to the hospital. Questions to ask your health care provider  Do I need to meet with a diabetes educator?  What equipment will I need to care for myself at home?  What diabetes medicines do I need? When should I take them?  How often do I need to check my blood sugar?  What number can I call if I have questions?  When is my next doctor's visit?  Where can I find a support group for people with diabetes? Where to find more information  American Diabetes Association: www.diabetes.org  American Association of Diabetes Educators: www.diabeteseducator.org/patient-resources Contact a doctor if:  Your blood sugar is at or above 240 mg/dL (13.3 mmol/L) for 2 days in a row.  You have been sick or have had a fever for 2 days or more, and you are not getting better.  You have any of these problems for more than 6 hours: ? You cannot eat or drink. ? You feel sick to your stomach (nauseous). ? You throw up (vomit). ? You have watery poop (diarrhea). Get help right away if:  Your blood sugar is lower than 54 mg/dL (3 mmol/L).  You get confused.  You have trouble: ? Thinking clearly. ? Breathing. Summary  Diabetes  (diabetes mellitus) is a long-term (chronic) disease. It occurs when the body does not properly use sugar (glucose) that is released from food after digestion.  Take insulin and diabetes medicines as told.  Check your blood sugar every day, as often as told.  Keep all follow-up visits as told by your doctor. This is important. This information is not intended to replace advice given to you by your health care provider. Make sure you discuss any questions you have with your health care provider. Document Revised: 04/22/2019 Document Reviewed: 11/01/2017 Elsevier Patient Education  [place].

## 2020-07-20 NOTE — Progress Notes (Signed)
Subjective:    Patient ID: [name] [name], male    DOB: 09-27-1955, 64 y.o.   MRN: [id number]  No chief complaint on file.   HPI Patient is a 64 year old male with past medical history significant for chronic diastolic CHF, HTN, A fib/flutter, CAD, hep C, OSA, type II DM, CKD 3, HLD, gout was seen today for follow-up.  Patient seen on 07/18/2020 by nurse for AWV, advised to make follow-up appointment with PCP.  Last seen by this provider 08/13/2019.  Pt not checking fsbs at home as could not find his meter.  Feels like bs likely high.  Taking lantus 35 units daily and Metformin 1000 mg BID.  Notes decreased appetite x 2-3 wks. May eat cereal in a.m. and a sandwich for lunch.  Also with increased thirst, frequent urination, blurred vision, and dry mouth.  Endorses loose stool and abdominal distention/bloating since starting torsemide 40 mg twice daily 1.5 weeks ago.  Since last OFV patient notes some improvement in dizziness.  Followed by cardiology for CHF, A. fib, CAD.  Denies LE edema.  Sleeping on 2 pillows at night.  Having DOE with walking short distances.   Started smoking cigarettes in the 9 th grade.  Got down to 5 cigs/day, then finally quit in the late 80s/early 90s when his kids were born.  Notes tolerable intermittent low back pain at night.  States mattress is 51-8 years old.  Past Medical History:  Diagnosis Date  . Anginal pain ([place])   . Anxiety   . Arrhythmia   . CAD (coronary artery disease), native coronary artery    a. Nonobstructive CAD by cath 2013 - diffuse distal and branch vessel CAD, no severe disease in the major coronaries, LV mild global hypokinesis, EF 45%. b. ETT-Sestamibi 5/14: EF 31%, small fixed inferior defect with no ischemia.  . Cholecystitis   . Chronic CHF ([place])    a. Mixed ICM/NICM (?EtOH). EF 35% in 2008. Echo 5/13: EF 60-65%, mod LVH, EF 45% on V gram in 12/2011. EF 12/2012: EF 50-55%, mild LVH, inferobasal HK, mild MR. ETT-Ses 5/14 EF 41%. Cardiac  MRI 5/14: EF 44%, mild global HK, subepicardial delayed enhancement in nonspecific RV insertion pattern.  . COLONIC POLYPS, HX OF 12/30/2006  . Gout   . H/O atrial flutter 2007   a. Ablations in 2007, 2008.  [name] Heart murmur   . HEPATITIS B, CHRONIC 12/30/2006  . History of alcohol abuse   . History of hiatal hernia   . History of medication noncompliance   . History of nuclear stress test    Myoview 3/17: + chest pain; EF 33%, downsloping ST depression 2, V4-6, LVH with strain, no ischemia on images; intermediate risk   . HYPERCHOLESTEROLEMIA 07/11/2010  . Hypertension   . Left sciatic nerve pain since 04/2015  . LIVER FUNCTION TESTS, ABNORMAL, HX OF 12/30/2006  . MITRAL REGURGITATION 12/30/2006  . 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 PAF (paroxysmal atrial fibrillation) ([facility])    On coumadin  . Rectal bleeding 12/18/2011   Scheduled for colonoscopy.    . Shortness of breath dyspnea    with excertion  . Sleep apnea    "suppose to send mask but they never did" (05/03/2015)  . Type II diabetes mellitus ([facility])     Allergies  Allergen Reactions  . Shrimp [Shellfish Allergy] Nausea And Vomiting  . Ace Inhibitors Cough  . Other Hives and Other (See Comments)    Patient reports developing hives  after receiving "some antibiotic given in 1980''s at [facility]". He does not know which antibiotic.    ROS General: Denies fever, chills, night sweats, changes in weight + decreased appetite HEENT: Denies headaches, ear pain, rhinorrhea, sore throat  + blurred vision CV: Denies CP, palpitations, SOB, orthopnea +DOE Pulm: Denies SOB, cough, wheezing  +DOE GI: Denies abdominal pain, nausea, vomiting, constipation  + loose stool, abdominal distention GU: Denies dysuria, hematuria, frequency Msk: Denies muscle cramps, joint pains Neuro: Denies weakness, numbness, tingling Skin: Denies rashes, bruising Psych: Denies depression, anxiety, hallucinations    Objective:     Blood pressure 120/70, pulse 80, temperature 98.5 F (36.9 C), temperature source Oral, weight 210 lb 12.8 oz (95.6 kg), SpO2 98 %.  Gen. Pleasant, well-nourished, in no distress, normal affect   HEENT: [place]/AT, face symmetric, conjunctiva clear, no scleral icterus, arcus senilis, PERRLA, EOMI, nares patent without drainage, pharynx without erythema or exudate. Neck: No JVD, no thyromegaly, no carotid bruits Lungs: no accessory muscle use, CTAB, no wheezes or rales Cardiovascular: RRR, no m/r/g, no peripheral edema Abdomen: BS present, soft, distended, no fluid wave appreciated, NT, no hepatosplenomegaly.  No caput medusa.  Umbilical hernia reducible. Musculoskeletal: No deformities, no cyanosis or clubbing, normal tone Neuro:  A&Ox3, CN II-XII intact, normal gait Skin:  Warm, extremely dry on hands and feet, intact.  Diabetic Foot Exam - Simple   Simple Foot Form Diabetic Foot exam was performed with the following findings: Yes 07/20/2020  1:57 PM  Visual Inspection See comments: Yes Sensation Testing See comments: Yes Pulse Check Posterior Tibialis and Dorsalis pulse intact bilaterally: Yes Comments No deformities, dry, rough, ashy skin of feet.  Callus formation on medial great toes b/l.  Toenails without hypertrophy, slightly long.  Decreased vibratory sensation b/l.  Intact monofilament testing b/l.       Wt Readings from Last 3 Encounters:  07/18/20 210 lb 9 oz (95.5 kg)  07/15/20 209 lb 12.8 oz (95.2 kg)  04/05/20 207 lb 6.4 oz (94.1 kg)    Lab Results  Component Value Date   WBC 7.8 01/29/2020   HGB 12.6 (L) 02/01/2020   HCT 37.0 (L) 02/01/2020   PLT 330 01/29/2020   GLUCOSE 218 (H) 07/15/2020   CHOL 124 07/15/2020   TRIG 116 07/15/2020   HDL 43 07/15/2020   LDLDIRECT 111.0 04/22/2015   LDLCALC 58 07/15/2020   ALT 19 06/27/2019   AST 14 (L) 06/27/2019   NA 137 07/15/2020   K 4.1 07/15/2020   CL 102 07/15/2020   CREATININE 1.19 07/15/2020   BUN 16 07/15/2020    CO2 23 07/15/2020   TSH 0.808 06/26/2019   PSA 0.91 03/30/2010   INR 1.2 04/24/2019   HGBA1C 6.6 (H) 06/27/2019   MICROALBUR 2.5 (H) 01/26/2016    Assessment/Plan:  Type 2 diabetes mellitus with other specified complication, with long-term current use of insulin ([place])  -bs elevated at Cardiology office, 218 -Stress importance of lifestyle modifications -We will provide patient with new Rx for glucometer and supplies -continue lantus 35 units and Metformin 1000 mg BID. -pt to check fsbs TID and keep a log of readings to bring to clinic -will make further medication adjustments as needed based off readings. -Given precautions -Follow-up in 2-4 weeks, sooner if needed - Plan: POC Glucose (CBG), Microalbumin/Creatinine Ratio, Urine, Hemoglobin A1c, blood glucose meter kit and supplies KIT, Ambulatory referral to Podiatry, Hemoglobin A1c, POCT glycosylated hemoglobin (Hb A1C)  Essential hypertension -Controlled -Continue current medications  including Norvasc 5 mg, carvedilol 25 mg twice daily, sotalol 120 mg, hydralazine 75 mg BID, Imdur 120 mg, torsemide 40 mg twice daily - Plan: CMP with eGFR([facility])  Decreased appetite -Discussed possible causes -Colonoscopy up-to-date as done in 2020.  Due 2030 -We will obtain labs - Plan: CBC with Differential/Platelet, PSA, TSH, T4, free  Umbilical hernia without obstruction and without gangrene -Asymptomatic -Continue to monitor -given precautions  Abdominal distention  - Plan: CBC with Differential/Platelet, PSA  Prostate cancer screening  - Plan: PSA  Loose stools -consider a probiotic -discuss torsemide with Cardiology as possible contributing. - Plan: CMP with eGFR([facility]), CBC with Differential/Platelet, TSH, T4, free  F/u in 1 month, sooner if needed  Grier Mitts, MD

## 2020-07-21 LAB — CBC WITH DIFFERENTIAL/PLATELET
Absolute Monocytes: 731 cells/uL (ref 200–950)
Basophils Absolute: 50 cells/uL (ref 0–200)
Basophils Relative: 0.8 %
Eosinophils Absolute: 284 cells/uL (ref 15–500)
Eosinophils Relative: 4.5 %
HCT: 46.6 % (ref 38.5–50.0)
Hemoglobin: 14.1 g/dL (ref 13.2–17.1)
Lymphs Abs: 1588 cells/uL (ref 850–3900)
MCH: 21.6 pg — ABNORMAL LOW (ref 27.0–33.0)
MCHC: 30.3 g/dL — ABNORMAL LOW (ref 32.0–36.0)
MCV: 71.3 fL — ABNORMAL LOW (ref 80.0–100.0)
MPV: 10 fL (ref 7.5–12.5)
Monocytes Relative: 11.6 %
Neutro Abs: 3648 cells/uL (ref 1500–7800)
Neutrophils Relative %: 57.9 %
Platelets: 244 10*3/uL (ref 140–400)
RBC: 6.54 10*6/uL — ABNORMAL HIGH (ref 4.20–5.80)
RDW: 20.2 % — ABNORMAL HIGH (ref 11.0–15.0)
Total Lymphocyte: 25.2 %
WBC: 6.3 10*3/uL (ref 3.8–10.8)

## 2020-07-21 LAB — COMPLETE METABOLIC PANEL WITH GFR
AG Ratio: 1.1 (calc) (ref 1.0–2.5)
ALT: 12 U/L (ref 9–46)
AST: 13 U/L (ref 10–35)
Albumin: 4.1 g/dL (ref 3.6–5.1)
Alkaline phosphatase (APISO): 72 U/L (ref 35–144)
BUN/Creatinine Ratio: 16 (calc) (ref 6–22)
BUN: 22 mg/dL (ref 7–25)
CO2: 27 mmol/L (ref 20–32)
Calcium: 10.1 mg/dL (ref 8.6–10.3)
Chloride: 99 mmol/L (ref 98–110)
Creat: 1.34 mg/dL — ABNORMAL HIGH (ref 0.70–1.25)
GFR, Est African American: 64 mL/min/{1.73_m2} (ref >=60)
GFR, Est Non African American: 55 mL/min/{1.73_m2} — ABNORMAL LOW (ref >=60)
Globulin: 3.8 g/dL (calc) — ABNORMAL HIGH (ref 1.9–3.7)
Glucose, Bld: 201 mg/dL — ABNORMAL HIGH (ref 65–99)
Potassium: 4.1 mmol/L (ref 3.5–5.3)
Sodium: 137 mmol/L (ref 135–146)
Total Bilirubin: 0.5 mg/dL (ref 0.2–1.2)
Total Protein: 7.9 g/dL (ref 6.1–8.1)

## 2020-07-21 LAB — TSH: TSH: 1.15 mIU/L (ref 0.40–4.50)

## 2020-07-21 LAB — T4, FREE: Free T4: 1.2 ng/dL (ref 0.8–1.8)

## 2020-07-27 ENCOUNTER — Other Ambulatory Visit: Payer: Self-pay

## 2020-07-27 ENCOUNTER — Ambulatory Visit (HOSPITAL_COMMUNITY)
Admission: RE | Admit: 2020-07-27 | Discharge: 2020-07-27 | Disposition: A | Discharge status: Home / Self Care | Payer: Medicare Other | Source: Ambulatory Visit | Attending: Internal Medicine | Admitting: Internal Medicine

## 2020-07-27 DIAGNOSIS — I5032 Chronic diastolic (congestive) heart failure: Secondary | ICD-10-CM

## 2020-07-27 LAB — BASIC METABOLIC PANEL
Anion gap: 12 (ref 5–15)
BUN: 19 mg/dL (ref 8–23)
CO2: 26 mmol/L (ref 22–32)
Calcium: 9.5 mg/dL (ref 8.9–10.3)
Chloride: 100 mmol/L (ref 98–111)
Creatinine, Ser: 1.33 mg/dL — ABNORMAL HIGH (ref 0.61–1.24)
GFR, Estimated: 59 mL/min — ABNORMAL LOW (ref >60)
Glucose, Bld: 136 mg/dL — ABNORMAL HIGH (ref 70–99)
Potassium: 3.6 mmol/L (ref 3.5–5.1)
Sodium: 138 mmol/L (ref 135–145)

## 2020-07-27 LAB — CBC
HCT: 47.1 % (ref 39.0–52.0)
Hemoglobin: 13.9 g/dL (ref 13.0–17.0)
MCH: 21.4 pg — ABNORMAL LOW (ref 26.0–34.0)
MCHC: 29.5 g/dL — ABNORMAL LOW (ref 30.0–36.0)
MCV: 72.5 fL — ABNORMAL LOW (ref 80.0–100.0)
Platelets: 238 10*3/uL (ref 150–400)
RBC: 6.5 MIL/uL — ABNORMAL HIGH (ref 4.22–5.81)
RDW: 21.6 % — ABNORMAL HIGH (ref 11.5–15.5)
WBC: 7.2 10*3/uL (ref 4.0–10.5)
nRBC: 0 % (ref 0.0–0.2)

## 2020-08-19 ENCOUNTER — Other Ambulatory Visit (HOSPITAL_COMMUNITY): Payer: Self-pay | Admitting: Cardiology

## 2020-08-19 DIAGNOSIS — I5032 Chronic diastolic (congestive) heart failure: Secondary | ICD-10-CM

## 2020-08-29 ENCOUNTER — Encounter (HOSPITAL_COMMUNITY): Payer: Medicare Other

## 2020-09-01 NOTE — Progress Notes (Signed)
Patient canceled appt. Note left for templating purposes only.    Patient ID: [name] [name], male   DOB: February 25, 1956, 65 y.o.   MRN: [id number] PCP: [name], MD Cardiology: Dr. Aundra Dubin  65 y.o. with history of HTN, diabetes, paroxysmal atrial fibrillation, and CAD presents for cardiology followup.  He was admitted in 5/13 with chest pain with exertion.  LHC was done showing global HK with EF 45% and diffuse, severe distal and branch vessel disease.  There was no interventional option, but it was suspect that this disease could be causing his anginal-type pain.  Echo at that time was read as showing EF 60-65%.  He was admitted in 5/14 with hypertensive urgency and chest pain.  He ruled out for MI and BP was controlled.  His EF was 50-55% by echo.  ETT-Sestamibi done as outpatient showed small fixed inferior defect with no ischemia but EF was 31%.  He was quite hypertensive during the study.  Cardiac MRI done to confirm EF in 5/14 showed EF 44%.  He had an episode of atrial fibrillation/RVR in 3/15 but went back into NSR.  Echo in 2/16 showed EF 55-60% with moderate diastolic dysfunction.   He had a Cardiolite in 9/16 showing inferior ischemia.  He was having exertional dyspnea with less activity than normal.  There was concern for worsened CAD and he was sent for [facility] in 9/16. By RHC, filling pressures were not significantly elevated. He had a severe distal RCA stenosis that was treated with DES x 2.  Echo showed EF 55-60%.    In 10/16, he was admitted with acute cholecystitis.  He received a cholecystostomy tube.  Cholecystectomy done in 6/17.   Atrial fibrillation in 1/17, cardioverted to NSR.   Recurrent concerning chest pain in 3/17.  Repeat LHC showed diffuse distal and branch vessel disease consistent with poorly-controlled DM, but patent RCA stents.    He was admitted with atrial fibrillation in 12/17.  Started on sotalol and converted to NSR.    He had recurrent chest  pain in 5/18 with abnormal Cardiolite.  Cath was done again in 5/18, showing diffuse CAD with no good interventional options.   Admitted 11/20 with increased dyspnea and exertional chest pain. Hgb went down to 8.1. GI consulted.  Had [facility] that showed severe distal and branch vessel disease but no interventional targets and not good anatomy for CABG. Continued on medical management. Capsule endoscopy without recent or active bleeding, no AVMs. He went into atrial flutter during his hospitalization. He went back into NSR, however, after discharge.  +Snoring and daytime sleepiness.   He has had episodes of syncope associated with vigorous coughing.   He was in the ER with atrial flutter and dyspnea in 4/21, back in NSR today on sotalol.    Returned on 12/21 for followup of CHF.  Weight is up 2 lbs.  He has not sent in a Cardiomems reading since 11/13, it was high at 27 on that day.  He is short of breath walking about 100 feet. He was having episodes of chest pain about a week ago.  He took extra Lasix and this resolved, no further chest pain. He chronically sleeps on 2 pillows.  Lasix stopped, torsemide 40 mg bid started. Sleep study reordered.  Today he returns for HF follow up. Overall feeling fine. Denies increasing SOB, CP, dizziness, edema, or PND/Orthopnea. Appetite ok. No fever or chills. Weight at home 170 pounds. Taking all medications.    ECG (  personally reviewed):   Labs (5/13): K 3.6, creatinine 0.93, LDL 85, HDL 57 Labs (7/13): K 3.9, creatinine 1.1, LDL 57, HDL 51  Labs (5/14): K 4, creatinine 1.25 Labs (8/14): K 3.7, creatinine 0.9, LDL 103, HDL 42 Labs (8/15): K 3.5 creatinine 1.0, BNP 75 Labs (2/16): K 3.8, creatinine 1.1 Labs (9/16): K 3.5, creatinine 1.03, BNP 59, HDL 40, LDL 111, TGs 333 Labs (11/16): K 3.6, creaitnine 1.07, LFTs normal, LDL 44 Labs (6/17): K 3.4, creatinine 1.52 => 1.13 Labs (1/18): K 3.9, creatinine 1.25 Labs (6/18): K 3.8, creatinine 1.21, HCT 39.7 Labs  (8/18): LDL 39, HDL 43 Labs (12/18): 4.4, creatinine 1.25, LFTs normal Labs (8/19): K 3.6, creatinine 1.13, LDL 68, HDL 49  Labs (11/20): K 3.5, creatinine 1.33, LDL 25 Labs (4/21): K 4.0, SCr 1.14, BNP 266  Labs (6/21): K 3.7, creatinine 1.25 Labs (12/21): K 3.6, creatinine 1.33, LDL 58, HDL 31, A1c 9.8  PMH: 1. DM2 2. Gout 3. HTN: Cough with ACEI.  4. Cardiomyopathy: Suspect mixed ischemic/nonischemic (?ETOH-related).  EF 35% in 2008.  Echo in 5/13 showed EF 60-65% with moderate LVH but EF was 45% on LV-gram in 5/13.  Echo (5/14) with EF 50-55%, mild LVH, inferobasal HK, mild MR.  ETT-Sestamibi in 5/14, however, showed EF 31%.  Cardiac MRI (6/14) with EF 44%, mild global hypokinesis, subepicardial delayed enhancement in a nonspecific RV insertion site pattern.  Echo (2/16) with EF 55-60%, moderate diastolic dysfunction. RHC (9/16) with mean RA 8, PA 39/20 mean 28, mean PCWP 14, CI 2.2. Echo (9/16) with EF 55-60%, grade II diastolic dysfunction.  - Echo (6/17) with EF 55-60%, moderate LVH, grade II diastolic dysfunction, mild AI, mild MR.  - RHC (11/20): mean RA 4, PA 46/14 mean 26, mean PCWP 13, CI 3.23 - Echo (9/20): EF 60-65%, moderate LVH, normal RV size and systolic function, mild-moderate MR, mild AI.  - Echo (5/21): EF 55%, RV normal - Has Cardiomems.  5. CAD: LHC in 2010 with mild nonobstructive disease.  LHC (5/13) with diffuse distal and branch vessel disease, mild global hypokinesis and EF 45%.  ETT-Sestamibi (5/14): EF 31%, small fixed inferior defect with no ischemia.  Lexiscan Cardiolite (9/16) with EF 26%, moderate inferior defect that was partially reversible, suggesting ischemia => High risk study. LHC (9/16) with 40-50% mLAD, diffuse up to 50% distal LAD, 90% ostium of branch off ramus, 95% dRCA => DES to RCA x 2, EF 55-60%.   - LHC (3/17) with diffuse branch and distal vessel disease c/w poorly controlled DM, RCA stents patent.  - LHC (5/18): Diffuse CAD with no good  interventional option. 60% mRCA, 90% dLAD, 50% serial mid ramus stenoses, ramus branches with ostial 80-90% stenoses. - LHC (11/20): Severe distal and branch vessel disease but no interventional targets and not good anatomy for CABG  6. H/o chronic HBV 7. Osteochondroma left shoulder.  8. Hyperlipidemia 9. Paroxysmal atrial fibrillation: Coumadin.  Developed cough and increased ESR with amiodarone.  DCCV in 1/17.  Recurrent atrial fibrillation in 12/17, sotalol started.  10. Atrial flutter: had ablations in 2007 and 2008.  - Zio patch in 4/21 with 20% atrial flutter.  11. OSA on CPAP.  12. Acute cholecystitis (10/16): Cholecystostomy tube placed.  Cholecystectomy 6/17.  13. CKD 14. Anemia - Suspected GI bleed but EGD/c-scope 9/20 without significant findings.  - Capsule Endoscopy (11/20) without recent or active bleeding, no AVMs.  15. Syncope: Suspected cough syncope.   SH: Married, prior smoker.  Some  ETOH, occasionally heavy in past.  Does use occasional marijuana. Out of work Engineer, drilling. 2 daughters in grad school.   FH: CAD  ROS: All systems reviewed and negative except as per HPI.   Current Outpatient Medications  Medication Sig Dispense Refill   ACCU-CHEK SOFTCLIX LANCETS lancets Use as instructed for three times daily testing of blood glucose 100 each 12   acetaminophen (TYLENOL) 500 MG tablet Take 1,000 mg by mouth every 6 (six) hours as needed (pain).     albuterol (VENTOLIN HFA) 108 (90 Base) MCG/ACT inhaler Inhale 1-2 puffs into the lungs every 6 (six) hours as needed for wheezing or shortness of breath. 8 g 0   amLODipine (NORVASC) 5 MG tablet Take 1 tablet by mouth once daily 90 tablet 3   apixaban (ELIQUIS) 5 MG TABS tablet Take 1 tablet (5 mg total) by mouth 2 (two) times daily. 60 tablet 3   atorvastatin (LIPITOR) 80 MG tablet Take 1 tablet by mouth once daily 90 tablet 3   blood glucose meter kit and supplies KIT Dispense based on patient and insurance preference. Use  up to four times daily as directed. (FOR ICD-9 250.00, 250.01). 1 each 0   Blood Glucose Monitoring Suppl (ACCU-CHEK AVIVA PLUS) w/Device KIT Use as directed for 3 times daily testing of blood glucose 1 kit 0   carvedilol (COREG) 25 MG tablet TAKE 1 TABLET BY MOUTH TWICE DAILY WITH A MEAL 180 tablet 3   empagliflozin (JARDIANCE) 10 MG TABS tablet Take 1 tablet (10 mg total) by mouth daily before breakfast. 30 tablet 4   glucose blood (ACCU-CHEK AVIVA PLUS) test strip Use as instructed for 3 times daily testing of blood glucose 100 each 12   guaiFENesin (MUCINEX PO) Take 1 tablet by mouth 2 (two) times daily as needed (cough/congestion).      hydrALAZINE (APRESOLINE) 50 MG tablet TAKE 1 & 1/2 (ONE & ONE-HALF) TABLETS BY MOUTH TWICE DAILY 90 tablet 5   insulin glargine (LANTUS SOLOSTAR) 100 UNIT/ML Solostar Pen Inject 35 Units into the skin every morning. And pen needles 1/day 30 mL 3   isosorbide mononitrate (IMDUR) 120 MG 24 hr tablet Take 1 tablet by mouth once daily 30 tablet 0   Lancet Devices (ACCU-CHEK SOFTCLIX) lancets Use as instructed for 3 times daily testing of blood glucose 1 each 0   metFORMIN (GLUCOPHAGE) 1000 MG tablet TAKE 1 TABLET BY MOUTH TWICE DAILY WITH A MEAL 60 tablet 3   nitroGLYCERIN (NITROSTAT) 0.4 MG SL tablet Place 1 tablet (0.4 mg total) under the tongue every 5 (five) minutes as needed for chest pain (x 3 tabs daily). 60 tablet 1   sotalol (BETAPACE) 120 MG tablet Take 1 tablet (120 mg total) by mouth 2 (two) times daily. 180 tablet 3   torsemide (DEMADEX) 20 MG tablet Take 2 tablets (40 mg total) by mouth 2 (two) times daily. 360 tablet 3   TRUEPLUS LANCETS 28G MISC 1 each by Does not apply route 3 (three) times daily. 100 each 12   No current facility-administered medications for this visit.    There were no vitals taken for this visit.  Wt Readings from Last 3 Encounters:  07/20/20 95.6 kg  07/18/20 95.5 kg  07/15/20 95.2 kg   PHYSICAL EXAM: General:  NAD. No  resp difficulty HEENT: Normal Neck: Supple. No JVD. Carotids 2+ bilat; no bruits. No lymphadenopathy or thryomegaly appreciated. Cor: PMI nondisplaced. Regular rate & rhythm. No rubs, gallops or murmurs. Lungs: Clear  Abdomen: Soft, nontender, nondistended. No hepatosplenomegaly. No bruits or masses. Good bowel sounds. Extremities: No cyanosis, clubbing, rash, edema Neuro: alert & oriented x 3, cranial nerves grossly intact. Moves all 4 extremities w/o difficulty. Affect pleasant.  Assessment/Plan:   1. Chronic diastolic CHF: EF 82% on cardiac MRI in 6/14 but EF improved back to 55-60% on 2/16 echo.  However, EF down to 26% on Cardiolite in 9/16.  Echo in 9/20 showed EF 60-65%, moderate LVH, normal RV, mild-moderate MR.  Echo in 5/21 with EF 55%, normal RV.  He has a Cardiomems device but has not been sending readings in regularly.  He appears volume overloaded on exam, NYHA class II-III symptoms.  - Stop Lasix, start torsemide 40 mg bid.  - I encouraged him to send Cardiomems daily.   - BMET today and again in 10 days.  2. Atrial fibrillation/flutter: Paroxysmal. He has had atrial flutter ablations x 2 remotely.  He is on sotalol.  QTc ok on ECG today.  He tolerates atrial arrhythmias poorly, seems to worsen CHF.  Zio patch in 4/21 with 20% atrial flutter. He saw EP, repeat ablation was not recommended. He is in NSR today, no palpitations.  - Continue sotalol.  - Suspect untreated OSA may be contributing. Will need sleep study.  - Continue Eliquis.     3. Coronary artery disease: LHC in 9/16 showed diffuse CAD, most significant involving the distal RCA.  Patient had DES x 2 to RCA.  Cath in 3/17 with patent RCA stents and diffuse distal and branch vessel disease concerning for poorly controlled diabetes.  Cath in 5/18 again showed diffuse CAD without good interventional target.  Cath in 11/20 showed severe distal and branch vessel disease but no interventional targets and not good anatomy for  CABG.  No chest pain currently.  - No ASA given Eliquis use.  - Continue atorvastatin 80 mg daily, lipids ok (12/21)  - Continue Coreg, amlodipine, and Imdur 120 mg daily for angina control.  - Avoid ranolazine with sotalol use.  4. Hyperlipidemia:  On atorvastatin.  5. Hypertension: Controlled on current regimen.   6. OSA: not on CPAP. Did not tolerate previously. We discussed CV affects of untreated OSA including potential for worsening HF and recurrent atrial arrhthymias. He is willing to retry CPAP. Will order home sleep study for repeat assessment (daytime sleepiness, snoring).  7. CKD: BMET today.   Followup 6 wks with NP/PA to reassess volume.  Maricela Bo Lunette Tapp FNP-BC 09/01/2020

## 2020-09-02 ENCOUNTER — Ambulatory Visit (HOSPITAL_COMMUNITY)
Admission: RE | Admit: 2020-09-02 | Discharge: 2020-09-02 | Disposition: A | Discharge status: Home / Self Care | Payer: Medicare Other | Source: Ambulatory Visit | Attending: Family Medicine | Admitting: Family Medicine

## 2020-09-07 ENCOUNTER — Other Ambulatory Visit: Payer: Self-pay

## 2020-09-07 ENCOUNTER — Ambulatory Visit (HOSPITAL_COMMUNITY)
Admission: RE | Admit: 2020-09-07 | Discharge: 2020-09-07 | Disposition: A | Discharge status: Home / Self Care | Payer: Medicare Other | Source: Ambulatory Visit | Attending: Family Medicine | Admitting: Family Medicine

## 2020-09-07 ENCOUNTER — Encounter (HOSPITAL_COMMUNITY): Payer: Self-pay

## 2020-09-07 VITALS — BP 125/86 | HR 73 | Wt 209.4 lb

## 2020-09-07 DIAGNOSIS — Z87891 Personal history of nicotine dependence: Secondary | ICD-10-CM | POA: Insufficient documentation

## 2020-09-07 DIAGNOSIS — Z794 Long term (current) use of insulin: Secondary | ICD-10-CM | POA: Diagnosis not present

## 2020-09-07 DIAGNOSIS — E785 Hyperlipidemia, unspecified: Secondary | ICD-10-CM | POA: Insufficient documentation

## 2020-09-07 DIAGNOSIS — Z8249 Family history of ischemic heart disease and other diseases of the circulatory system: Secondary | ICD-10-CM | POA: Diagnosis not present

## 2020-09-07 DIAGNOSIS — I5032 Chronic diastolic (congestive) heart failure: Secondary | ICD-10-CM | POA: Diagnosis not present

## 2020-09-07 DIAGNOSIS — I10 Essential (primary) hypertension: Secondary | ICD-10-CM | POA: Diagnosis not present

## 2020-09-07 DIAGNOSIS — E1122 Type 2 diabetes mellitus with diabetic chronic kidney disease: Secondary | ICD-10-CM | POA: Diagnosis not present

## 2020-09-07 DIAGNOSIS — Z955 Presence of coronary angioplasty implant and graft: Secondary | ICD-10-CM | POA: Insufficient documentation

## 2020-09-07 DIAGNOSIS — I130 Hypertensive heart and chronic kidney disease with heart failure and stage 1 through stage 4 chronic kidney disease, or unspecified chronic kidney disease: Secondary | ICD-10-CM | POA: Diagnosis not present

## 2020-09-07 DIAGNOSIS — N1831 Chronic kidney disease, stage 3a: Secondary | ICD-10-CM | POA: Diagnosis not present

## 2020-09-07 DIAGNOSIS — I2510 Atherosclerotic heart disease of native coronary artery without angina pectoris: Secondary | ICD-10-CM | POA: Diagnosis not present

## 2020-09-07 DIAGNOSIS — Z7901 Long term (current) use of anticoagulants: Secondary | ICD-10-CM | POA: Diagnosis not present

## 2020-09-07 DIAGNOSIS — I480 Paroxysmal atrial fibrillation: Secondary | ICD-10-CM | POA: Insufficient documentation

## 2020-09-07 DIAGNOSIS — Z79899 Other long term (current) drug therapy: Secondary | ICD-10-CM | POA: Diagnosis not present

## 2020-09-07 DIAGNOSIS — G4733 Obstructive sleep apnea (adult) (pediatric): Secondary | ICD-10-CM | POA: Diagnosis not present

## 2020-09-07 DIAGNOSIS — E1165 Type 2 diabetes mellitus with hyperglycemia: Secondary | ICD-10-CM | POA: Diagnosis not present

## 2020-09-07 DIAGNOSIS — N189 Chronic kidney disease, unspecified: Secondary | ICD-10-CM | POA: Diagnosis not present

## 2020-09-07 DIAGNOSIS — R531 Weakness: Secondary | ICD-10-CM | POA: Diagnosis not present

## 2020-09-07 DIAGNOSIS — I25119 Atherosclerotic heart disease of native coronary artery with unspecified angina pectoris: Secondary | ICD-10-CM | POA: Insufficient documentation

## 2020-09-07 LAB — BASIC METABOLIC PANEL
Anion gap: 10 (ref 5–15)
BUN: 20 mg/dL (ref 8–23)
CO2: 25 mmol/L (ref 22–32)
Calcium: 9.3 mg/dL (ref 8.9–10.3)
Chloride: 100 mmol/L (ref 98–111)
Creatinine, Ser: 1.5 mg/dL — ABNORMAL HIGH (ref 0.61–1.24)
GFR, Estimated: 51 mL/min — ABNORMAL LOW (ref >60)
Glucose, Bld: 303 mg/dL — ABNORMAL HIGH (ref 70–99)
Potassium: 4.3 mmol/L (ref 3.5–5.1)
Sodium: 135 mmol/L (ref 135–145)

## 2020-09-07 LAB — BRAIN NATRIURETIC PEPTIDE: B Natriuretic Peptide: 132.1 pg/mL — ABNORMAL HIGH (ref 0.0–100.0)

## 2020-09-07 MED ORDER — NITROGLYCERIN 0.4 MG SL SUBL: 0.4000 mg | SUBLINGUAL_TABLET | SUBLINGUAL | 1 refills | Status: DC | PRN

## 2020-09-07 MED ORDER — ATORVASTATIN CALCIUM 80 MG PO TABS: 80.0000 mg | ORAL_TABLET | Freq: Every day | ORAL | 3 refills | Status: DC

## 2020-09-07 NOTE — Progress Notes (Signed)
Advanced [facility] Failure Clinic Note PCP: [name], MD Cardiology: Dr. Aundra Dubin  65 y.o. with history of HTN, diabetes, paroxysmal atrial fibrillation, and CAD presents for cardiology followup.  He was admitted in 5/13 with chest pain with exertion.  LHC was done showing global HK with EF 45% and diffuse, severe distal and branch vessel disease.  There was no interventional option, but it was suspect that this disease could be causing his anginal-type pain.  Echo at that time was read as showing EF 60-65%.  He was admitted in 5/14 with hypertensive urgency and chest pain.  He ruled out for MI and BP was controlled.  His EF was 50-55% by echo.  ETT-Sestamibi done as outpatient showed small fixed inferior defect with no ischemia but EF was 31%.  He was quite hypertensive during the study.  Cardiac MRI done to confirm EF in 5/14 showed EF 44%.  He had an episode of atrial fibrillation/RVR in 3/15 but went back into NSR.  Echo in 2/16 showed EF 55-60% with moderate diastolic dysfunction.   He had a Cardiolite in 9/16 showing inferior ischemia.  He was having exertional dyspnea with less activity than normal.  There was concern for worsened CAD and he was sent for [facility] in 9/16. By RHC, filling pressures were not significantly elevated. He had a severe distal RCA stenosis that was treated with DES x 2.  Echo showed EF 55-60%.    In 10/16, he was admitted with acute cholecystitis.  He received a cholecystostomy tube.  Cholecystectomy done in 6/17.   Atrial fibrillation in 1/17, cardioverted to NSR.   Recurrent concerning chest pain in 3/17.  Repeat LHC showed diffuse distal and branch vessel disease consistent with poorly-controlled DM, but patent RCA stents.    He was admitted with atrial fibrillation in 12/17.  Started on sotalol and converted to NSR.    He had recurrent chest pain in 5/18 with abnormal Cardiolite.  Cath was done again in 5/18, showing diffuse CAD with no good  interventional options.   Admitted 11/20 with increased dyspnea and exertional chest pain. Hgb went down to 8.1. GI consulted.  Had [place] that showed severe distal and branch vessel disease but no interventional targets and not good anatomy for CABG. Continued on medical management. Capsule endoscopy without recent or active bleeding, no AVMs. He went into atrial flutter during his hospitalization. He went back into NSR, however, after discharge.  +Snoring and daytime sleepiness.   He has had episodes of syncope associated with vigorous coughing.   He was in the ER with atrial flutter and dyspnea in 4/21, back in NSR today on sotalol.    Returned on 12/21 for followup of CHF.  Weight up 2 lbs.  He has not sent in a Cardiomems reading since 11/13, it  27 that day.  He is short of breath walking about 100 feet & having episodes of chest pain about a week ago.  He took extra Lasix and this resolved, no further chest pain.  Lasix stopped, torsemide 40 mg bid started. Sleep study reordered.  Today he returns for HF follow up. Overall feeling fine. SOB with walking long distances, not very active. Denies increasing SOB, CP, dizziness, edema. He chronically sleeps on 2 pillows. Blood sugars remaining in the 300s on home meter. Appetite poor. No fever or chills. Does not weigh at home. Taking all medications. Eating take out/fast food 3x/week. Drinking 1 ETOH drink/night, mostly beer.  Cardiomems  09/06/20: 29, goal is 24. ECG (personally reviewed): SR w/ 1st degree AVB, PR 224 ms, 72 bpm, QTc 473 ms, inferior TWIs ReDs Clip: 38%  Labs (5/13): K 3.6, creatinine 0.93, LDL 85, HDL 57 Labs (7/13): K 3.9, creatinine 1.1, LDL 57, HDL 51  Labs (5/14): K 4, creatinine 1.25 Labs (8/14): K 3.7, creatinine 0.9, LDL 103, HDL 42 Labs (8/15): K 3.5 creatinine 1.0, BNP 75 Labs (2/16): K 3.8, creatinine 1.1 Labs (9/16): K 3.5, creatinine 1.03, BNP 59, HDL 40, LDL 111, TGs 333 Labs (11/16): K 3.6, creaitnine 1.07, LFTs  normal, LDL 44 Labs (6/17): K 3.4, creatinine 1.52 => 1.13 Labs (1/18): K 3.9, creatinine 1.25 Labs (6/18): K 3.8, creatinine 1.21, HCT 39.7 Labs (8/18): LDL 39, HDL 43 Labs (12/18): 4.4, creatinine 1.25, LFTs normal Labs (8/19): K 3.6, creatinine 1.13, LDL 68, HDL 49  Labs (11/20): K 3.5, creatinine 1.33, LDL 25 Labs (4/21): K 4.0, SCr 1.14, BNP 266  Labs (6/21): K 3.7, creatinine 1.25 Labs (12/21): K 3.6, creatinine 1.33, LDL 58, HDL 31, A1c 9.8  PMH: 1. DM2 2. Gout 3. HTN: Cough with ACEI.  4. Cardiomyopathy: Suspect mixed ischemic/nonischemic (?ETOH-related).  EF 35% in 2008.  Echo in 5/13 showed EF 60-65% with moderate LVH but EF was 45% on LV-gram in 5/13.  Echo (5/14) with EF 50-55%, mild LVH, inferobasal HK, mild MR.  ETT-Sestamibi in 5/14, however, showed EF 31%.  Cardiac MRI (6/14) with EF 44%, mild global hypokinesis, subepicardial delayed enhancement in a nonspecific RV insertion site pattern.  Echo (2/16) with EF 55-60%, moderate diastolic dysfunction. RHC (9/16) with mean RA 8, PA 39/20 mean 28, mean PCWP 14, CI 2.2. Echo (9/16) with EF 55-60%, grade II diastolic dysfunction.  - Echo (6/17) with EF 55-60%, moderate LVH, grade II diastolic dysfunction, mild AI, mild MR.  - RHC (11/20): mean RA 4, PA 46/14 mean 26, mean PCWP 13, CI 3.23 - Echo (9/20): EF 60-65%, moderate LVH, normal RV size and systolic function, mild-moderate MR, mild AI.  - Echo (5/21): EF 55%, RV normal - Has Cardiomems.  5. CAD: LHC in 2010 with mild nonobstructive disease.  LHC (5/13) with diffuse distal and branch vessel disease, mild global hypokinesis and EF 45%.  ETT-Sestamibi (5/14): EF 31%, small fixed inferior defect with no ischemia.  Lexiscan Cardiolite (9/16) with EF 26%, moderate inferior defect that was partially reversible, suggesting ischemia => High risk study. LHC (9/16) with 40-50% mLAD, diffuse up to 50% distal LAD, 90% ostium of branch off ramus, 95% dRCA => DES to RCA x 2, EF 55-60%.   -  LHC (3/17) with diffuse branch and distal vessel disease c/w poorly controlled DM, RCA stents patent.  - LHC (5/18): Diffuse CAD with no good interventional option. 60% mRCA, 90% dLAD, 50% serial mid ramus stenoses, ramus branches with ostial 80-90% stenoses. - LHC (11/20): Severe distal and branch vessel disease but no interventional targets and not good anatomy for CABG  6. H/o chronic HBV 7. Osteochondroma left shoulder.  8. Hyperlipidemia 9. Paroxysmal atrial fibrillation: Coumadin.  Developed cough and increased ESR with amiodarone.  DCCV in 1/17.  Recurrent atrial fibrillation in 12/17, sotalol started.  10. Atrial flutter: had ablations in 2007 and 2008.  - Zio patch in 4/21 with 20% atrial flutter.  11. OSA on CPAP.  12. Acute cholecystitis (10/16): Cholecystostomy tube placed.  Cholecystectomy 6/17.  13. CKD 14. Anemia - Suspected GI bleed but EGD/c-scope 9/20 without significant findings.  - Capsule  Endoscopy (11/20) without recent or active bleeding, no AVMs.  15. Syncope: Suspected cough syncope.   SH: Married, prior smoker. Some ETOH, occasionally heavy in past.  Does use occasional marijuana. Out of work Engineer, drilling. 2 daughters in grad school.   FH: CAD  ROS: All systems reviewed and negative except as per HPI.   Current Outpatient Medications  Medication Sig Dispense Refill  . ACCU-CHEK SOFTCLIX LANCETS lancets Use as instructed for three times daily testing of blood glucose 100 each 12  . acetaminophen (TYLENOL) 500 MG tablet Take 1,000 mg by mouth every 6 (six) hours as needed (pain).    [name] albuterol (VENTOLIN HFA) 108 (90 Base) MCG/ACT inhaler Inhale 1-2 puffs into the lungs every 6 (six) hours as needed for wheezing or shortness of breath. 8 g 0  . amLODipine (NORVASC) 5 MG tablet Take 1 tablet by mouth once daily 90 tablet 3  . apixaban (ELIQUIS) 5 MG TABS tablet Take 1 tablet (5 mg total) by mouth 2 (two) times daily. 60 tablet 3  . atorvastatin (LIPITOR) 80 MG  tablet Take 1 tablet by mouth once daily 90 tablet 3  . blood glucose meter kit and supplies KIT Dispense based on patient and insurance preference. Use up to four times daily as directed. (FOR ICD-9 250.00, 250.01). 1 each 0  . Blood Glucose Monitoring Suppl (ACCU-CHEK AVIVA PLUS) w/Device KIT Use as directed for 3 times daily testing of blood glucose 1 kit 0  . carvedilol (COREG) 25 MG tablet TAKE 1 TABLET BY MOUTH TWICE DAILY WITH A MEAL 180 tablet 3  . empagliflozin (JARDIANCE) 10 MG TABS tablet Take 1 tablet (10 mg total) by mouth daily before breakfast. 30 tablet 4  . glucose blood (ACCU-CHEK AVIVA PLUS) test strip Use as instructed for 3 times daily testing of blood glucose 100 each 12  . guaiFENesin (MUCINEX PO) Take 1 tablet by mouth 2 (two) times daily as needed (cough/congestion).     . hydrALAZINE (APRESOLINE) 50 MG tablet TAKE 1 & 1/2 (ONE & ONE-HALF) TABLETS BY MOUTH TWICE DAILY 90 tablet 5  . insulin glargine (LANTUS SOLOSTAR) 100 UNIT/ML Solostar Pen Inject 35 Units into the skin every morning. And pen needles 1/day 30 mL 3  . isosorbide mononitrate (IMDUR) 120 MG 24 hr tablet Take 1 tablet by mouth once daily 30 tablet 0  . Lancet Devices (ACCU-CHEK SOFTCLIX) lancets Use as instructed for 3 times daily testing of blood glucose 1 each 0  . metFORMIN (GLUCOPHAGE) 1000 MG tablet TAKE 1 TABLET BY MOUTH TWICE DAILY WITH A MEAL 60 tablet 3  . nitroGLYCERIN (NITROSTAT) 0.4 MG SL tablet Place 1 tablet (0.4 mg total) under the tongue every 5 (five) minutes as needed for chest pain (x 3 tabs daily). 60 tablet 1  . sotalol (BETAPACE) 120 MG tablet Take 1 tablet (120 mg total) by mouth 2 (two) times daily. 180 tablet 3  . torsemide (DEMADEX) 20 MG tablet Take 2 tablets (40 mg total) by mouth 2 (two) times daily. 360 tablet 3  . TRUEPLUS LANCETS 28G MISC 1 each by Does not apply route 3 (three) times daily. 100 each 12   No current facility-administered medications for this encounter.    BP  125/86   Pulse 73   Wt 95 kg (209 lb 6.4 oz)   SpO2 97%   BMI 28.40 kg/m   Wt Readings from Last 3 Encounters:  09/07/20 95 kg (209 lb 6.4 oz)  07/20/20 95.6 kg (210  lb 12.8 oz)  07/18/20 95.5 kg (210 lb 9 oz)   PHYSICAL EXAM: General:  NAD. No resp difficulty HEENT: Normal Neck: Supple. JVP 8-9 cm. Carotids 2+ bilat; no bruits. No lymphadenopathy or thryomegaly appreciated. Cor: PMI nondisplaced. Regular rate & rhythm. No rubs, gallops or murmurs. Lungs: Clear Abdomen: Soft, nontender, nondistended. No hepatosplenomegaly. No bruits or masses. Good bowel sounds. Extremities: No cyanosis, clubbing, rash, edema Neuro: alert & oriented x 3, cranial nerves grossly intact. Moves all 4 extremities w/o difficulty. Affect pleasant.  Assessment/Plan:   1. Chronic diastolic CHF: EF 41% on cardiac MRI in 6/14 but EF improved back to 55-60% on 2/16 echo.  However, EF down to 26% on Cardiolite in 9/16.  Echo in 9/20 showed EF 60-65%, moderate LVH, normal RV, mild-moderate MR.  Echo in 5/21 with EF 55%, normal RV.  He has a Cardiomems device but has not been sending readings in regularly.  He appears volume overloaded on exam, ReDs 38%, cardiomems 29. NYHA class II- III symptoms.  - Increase torsemide to 80 mg bid + 20 mEq of KCl x 3 days, then back to 40 mg bid. - Continue Jardiance 10 mg daily. - Continue carvedilol 25 mg bid. - Encouraged him to send Cardiomems daily, PAD 09/06/20 was 29, goal is 24.   - Encouraged daily weights. - BMET today and again in 10 days.  2. Atrial fibrillation/flutter: Paroxysmal. He has had atrial flutter ablations x 2 remotely.  He is on sotalol.  QTc ok on ECG today.  He tolerates atrial arrhythmias poorly, seems to worsen CHF.  Zio patch in 4/21 with 20% atrial flutter. He saw EP, repeat ablation was not recommended. He is in NSR today, no palpitations.  - Continue sotalol 120 mg bid. QTc 473 ms. - Suspect untreated OSA may be contributing. Sleep study ordered &  will schedule today.  - Continue Eliquis.     3. Coronary artery disease: LHC in 9/16 showed diffuse CAD, most significant involving the distal RCA.  Patient had DES x 2 to RCA.  Cath in 3/17 with patent RCA stents and diffuse distal and branch vessel disease concerning for poorly controlled diabetes.  Cath in 5/18 again showed diffuse CAD without good interventional target. Cath in 11/20 showed severe distal and branch vessel disease but no interventional targets and not good anatomy for CABG.  No chest pain currently.  - No ASA given Eliquis use.  - Continue atorvastatin 80 mg daily, lipids ok (12/21)  - Continue Coreg 25 mg bid, amlodipine 5 mg daily, and Imdur 120 mg daily for angina control.  - Avoid ranolazine with sotalol use.  4. Hyperlipidemia:  On atorvastatin.  5. Hypertension: Controlled on current regimen.  Lipids ok (12/21). 6. OSA: not on CPAP. Did not tolerate previously. We discussed CV affects of untreated OSA including potential for worsening HF and recurrent atrial arrhthymias. He is willing to retry CPAP. Sleep study ordered (daytime sleepiness, snoring).  7. CKD: BMET today. Baseline SCr ~ 1.3 8. DM2: managed by PCP. Continue metformin + Lantus. - Continue Jardiance 10 mg daily. No GU symptoms. - a1c 9.8 (12/21).  Followup 6 wks with NP/PA to reassess volume, 3 months with Dr. Wynema Birch [facility] FNP-BC 09/07/2020

## 2020-09-07 NOTE — Patient Instructions (Signed)
Take Torsemide 80mg  for 3 days with an extra 20 meq of potassium.  Routine lab work today. Will notify you of abnormal results  Repeat labs in 7-10 days  Follow up in 2-3 months with Dr.McLean  Do the following things EVERYDAY: 1) Weigh yourself in the morning before breakfast. Write it down and keep it in a log. 2) Take your medicines as prescribed 3) Eat low salt foods--Limit salt (sodium) to 2000 mg per day.  4) Stay as active as you can everyday 5) Limit all fluids for the day to less than 2 liters

## 2020-09-07 NOTE — Progress Notes (Signed)
ReDS Vest / Clip - 09/07/20 1300      ReDS Vest / Clip   Station Marker C    Ruler Value 34    ReDS Value Range Moderate volume overload    ReDS Actual Value 38

## 2020-09-08 ENCOUNTER — Telehealth (HOSPITAL_COMMUNITY): Payer: Self-pay

## 2020-09-08 NOTE — Telephone Encounter (Signed)
Malena Edman, RN  09/08/2020 10:30 AM EST Back to Top     Patient advised and verbalized understanding   Malena Edman, RN  09/07/2020 4:05 PM EST      Left message to return call

## 2020-09-08 NOTE — Telephone Encounter (Signed)
-----   Message from Rafael Bihari, [place] sent at 09/07/2020  3:30 PM EST ----- Labs stable, diuretics being adjusted. Repeat BMET next week. Glucose is high, needs to follow up in PCP.

## 2020-09-09 ENCOUNTER — Encounter (INDEPENDENT_AMBULATORY_CARE_PROVIDER_SITE_OTHER): Payer: Medicare Other | Admitting: Cardiology

## 2020-09-09 DIAGNOSIS — G4733 Obstructive sleep apnea (adult) (pediatric): Secondary | ICD-10-CM

## 2020-09-14 ENCOUNTER — Ambulatory Visit (HOSPITAL_COMMUNITY)
Admission: RE | Admit: 2020-09-14 | Discharge: 2020-09-14 | Disposition: A | Discharge status: Home / Self Care | Payer: Medicare Other | Source: Ambulatory Visit | Attending: Internal Medicine | Admitting: Internal Medicine

## 2020-09-14 ENCOUNTER — Other Ambulatory Visit: Payer: Self-pay

## 2020-09-14 DIAGNOSIS — I5032 Chronic diastolic (congestive) heart failure: Secondary | ICD-10-CM | POA: Diagnosis not present

## 2020-09-14 LAB — BASIC METABOLIC PANEL
Anion gap: 11 (ref 5–15)
BUN: 27 mg/dL — ABNORMAL HIGH (ref 8–23)
CO2: 25 mmol/L (ref 22–32)
Calcium: 9.4 mg/dL (ref 8.9–10.3)
Chloride: 99 mmol/L (ref 98–111)
Creatinine, Ser: 1.44 mg/dL — ABNORMAL HIGH (ref 0.61–1.24)
GFR, Estimated: 54 mL/min — ABNORMAL LOW (ref >60)
Glucose, Bld: 263 mg/dL — ABNORMAL HIGH (ref 70–99)
Potassium: 3.4 mmol/L — ABNORMAL LOW (ref 3.5–5.1)
Sodium: 135 mmol/L (ref 135–145)

## 2020-09-15 ENCOUNTER — Other Ambulatory Visit (HOSPITAL_COMMUNITY): Payer: Medicare Other

## 2020-09-16 ENCOUNTER — Telehealth (HOSPITAL_COMMUNITY): Payer: Self-pay | Admitting: Cardiology

## 2020-09-16 ENCOUNTER — Telehealth (HOSPITAL_COMMUNITY): Payer: Self-pay | Admitting: Family Medicine

## 2020-09-16 DIAGNOSIS — I5032 Chronic diastolic (congestive) heart failure: Secondary | ICD-10-CM

## 2020-09-16 MED ORDER — POTASSIUM CHLORIDE ER 10 MEQ PO TBCR: 40.0000 meq | EXTENDED_RELEASE_TABLET | Freq: Every day | ORAL | 3 refills | Status: DC

## 2020-09-16 NOTE — Telephone Encounter (Signed)
Spoke with pt he is aware and agreeable with plan. Lab appt scheduled.

## 2020-09-16 NOTE — Telephone Encounter (Signed)
-----   Message from Rafael Bihari, [place] sent at 09/14/2020  1:55 PM EST ----- Kidney function improving, potassium is low. Please have him start taking 40 mEq of KCl daily. Repeat bmet in 1-2 weeks.

## 2020-09-16 NOTE — Telephone Encounter (Signed)
Kidney function improving, potassium is low. Please have him start taking 40 mEq of KCl daily. Repeat bmet in 1-2 weeks. I called in his potassium, can you make sure he has follow up labs scheduled? Thanks!

## 2020-09-16 NOTE — Addendum Note (Signed)
Addended by: Harvie Junior on: 09/16/2020 12:47 PM   Modules accepted: Orders

## 2020-09-16 NOTE — Telephone Encounter (Signed)
Pt aware  Repeat labs 2/18

## 2020-09-22 ENCOUNTER — Other Ambulatory Visit: Payer: Self-pay

## 2020-09-22 DIAGNOSIS — E1169 Type 2 diabetes mellitus with other specified complication: Secondary | ICD-10-CM

## 2020-09-22 DIAGNOSIS — Z794 Long term (current) use of insulin: Secondary | ICD-10-CM

## 2020-09-22 MED ORDER — METFORMIN HCL 1000 MG PO TABS: ORAL_TABLET | ORAL | 3 refills | Status: DC

## 2020-09-30 ENCOUNTER — Other Ambulatory Visit (HOSPITAL_COMMUNITY): Payer: Self-pay | Admitting: Cardiology

## 2020-09-30 ENCOUNTER — Other Ambulatory Visit (HOSPITAL_COMMUNITY): Payer: Medicare Other

## 2020-09-30 DIAGNOSIS — I5032 Chronic diastolic (congestive) heart failure: Secondary | ICD-10-CM

## 2020-10-18 ENCOUNTER — Ambulatory Visit: Payer: Medicare Other

## 2020-10-18 DIAGNOSIS — R0683 Snoring: Secondary | ICD-10-CM

## 2020-10-18 DIAGNOSIS — G4719 Other hypersomnia: Secondary | ICD-10-CM

## 2020-10-18 NOTE — Procedures (Signed)
  Sleep Study Report  Patient Information Name: [name] [name]  ID: [id number] Birth Date: Jul 06, 1956  Age: 65  Gender: Male Study Date:09/09/2020 Referring Physician:  Loralie Champagne, MD  TEST DESCRIPTION: Home sleep apnea testing was completed using the WatchPat, a Type 1 device, utilizing  peripheral arterial tonometry (PAT), chest movement, actigraphy, pulse oximetry, pulse rate, body position and snore.  AHI was calculated with apnea and hypopnea using valid sleep time as the denominator. RDI includes apneas,  hypopneas, and RERAs. The data acquired and the scoring of sleep and all associated events were performed in  accordance with the recommended standards and specifications as outlined in the AASM Manual for the Scoring of  Sleep and Associated Events 2.2.0 (2015).  FINDINGS: 1. Moderate Obstructive Sleep Apnea with AHI 15.8/hr.  2. Minimal Central Sleep Apnea with pAHIc 2.2/hr. 3. Oxygen desaturations as low as 85%. 4. Mild snoring was present. O2 sats were < 88% for 19.2 min. 5. Total sleep time was 5 hrs and 23 min. 6. 6.6% of total sleep time was spent in REM sleep.  7. Normal sleep onset latency at 16 min.  8. Prolonged REM sleep onset latency at 287 min.  9. Total awakenings were 13.   DIAGNOSIS:  Moderate Obstructive Sleep Apnea (G47.33)  RECOMMENDATIONS: 1. Clinical correlation of these findings is necessary. The decision to treat obstructive sleep apnea (OSA) is usually  based on the presence of apnea symptoms or the presence of associated medical conditions such as Hypertension,  Congestive Heart Failure, Atrial Fibrillation or Obesity. The most common symptoms of OSA are snoring, gasping for  breath while sleeping, daytime sleepiness and fatigue.   2. Initiating apnea therapy is recommended given the presence of symptoms and/or associated conditions.  Recommend proceeding with one of the following:   a. Auto-CPAP therapy with a pressure range of 5-20cm  H2O.   b. An oral appliance (OA) that can be obtained from certain dentists with expertise in sleep medicine. These are  primarily of use in non-obese patients with mild and moderate disease.   c. An ENT consultation which may be useful to look for specific causes of obstruction and possible treatment  Options.   d. If patient is intolerant to PAP therapy, consider referral to ENT for evaluation for hypoglossal nerve stimulator.   3. Close follow-up is necessary to ensure success with CPAP or oral appliance therapy for maximum benefit .  4. A follow-up oximetry study on CPAP is recommended to assess the adequacy of therapy and determine the need  for supplemental oxygen or the potential need for Bi-level therapy. An arterial blood gas to determine the adequacy of  baseline ventilation and oxygenation should also be considered.  5. Healthy sleep recommendations include: adequate nightly sleep (normal 7-9 hrs/night), avoidance of caffeine after  noon and alcohol near bedtime, and maintaining a sleep environment that is cool, dark and quiet.  6. Weight loss for overweight patients is recommended. Even modest amounts of weight loss can significantly  improve the severity of sleep apnea.  7. Snoring recommendations include: weight loss where appropriate, side sleeping, and avoidance of alcohol before  Bed.  8. Operation of motor vehicle or dangerous equipment must be avoided when feeling drowsy, excessively sleepy, or  mentally fatigued.  Report prepared by: Signature: Fransico Him, MD [facility], [place] Board of Sleep Medicine  Electronically Signed: Oct 18, 2020

## 2020-10-20 ENCOUNTER — Telehealth: Payer: Self-pay | Admitting: *Deleted

## 2020-10-20 NOTE — Telephone Encounter (Signed)
Informed patient of sleep study results and patient understanding was verbalized. Patient understands her sleep study showed they have sleep apnea. Order ResMed auto CPAP from 4 to 15cm H2O with heated humidity and mask of choice. Followup with me in 6 weeks.      Upon patient request DME selection is [facility]. Patient understands she/he will be contacted by [facility] to set up her/he cpap. Patient understands to call if [facility] does not contact her/he with new setup in a timely manner. Patient understands they will be called once confirmation has been received from [facility] that they have received their new machine to schedule 10 week follow up appointment.   [facility] notified of new cpap order  Please add to airview Patient was grateful for the call and thanked me.

## 2020-10-20 NOTE — Telephone Encounter (Signed)
-----   Message from Sueanne Margarita, MD sent at 10/18/2020 12:51 PM EST ----- Please let patient know that they have sleep apnea.  Order ResMed auto CPAP from 4 to 15cm H2O with heated humidity and mask of choice. Followup with me in 6 weeks.

## 2020-10-21 DIAGNOSIS — E1136 Type 2 diabetes mellitus with diabetic cataract: Secondary | ICD-10-CM | POA: Diagnosis not present

## 2020-10-21 DIAGNOSIS — H25813 Combined forms of age-related cataract, bilateral: Secondary | ICD-10-CM | POA: Diagnosis not present

## 2020-10-21 LAB — HM DIABETES EYE EXAM

## 2020-11-07 ENCOUNTER — Other Ambulatory Visit (HOSPITAL_COMMUNITY): Payer: Self-pay | Admitting: Adult Health

## 2020-11-14 ENCOUNTER — Encounter (HOSPITAL_COMMUNITY): Payer: Medicare Other | Admitting: Cardiology

## 2020-11-15 ENCOUNTER — Encounter (HOSPITAL_COMMUNITY): Payer: Medicare Other | Admitting: Cardiology

## 2020-12-02 ENCOUNTER — Ambulatory Visit (HOSPITAL_COMMUNITY)
Admission: RE | Admit: 2020-12-02 | Discharge: 2020-12-02 | Disposition: A | Discharge status: Home / Self Care | Payer: Medicare Other | Source: Ambulatory Visit | Attending: Cardiology | Admitting: Cardiology

## 2020-12-02 ENCOUNTER — Encounter (HOSPITAL_COMMUNITY): Payer: Self-pay | Admitting: Cardiology

## 2020-12-02 ENCOUNTER — Other Ambulatory Visit: Payer: Self-pay

## 2020-12-02 VITALS — BP 124/70 | HR 79 | Wt 205.0 lb

## 2020-12-02 DIAGNOSIS — Z8249 Family history of ischemic heart disease and other diseases of the circulatory system: Secondary | ICD-10-CM | POA: Insufficient documentation

## 2020-12-02 DIAGNOSIS — Z7901 Long term (current) use of anticoagulants: Secondary | ICD-10-CM | POA: Diagnosis not present

## 2020-12-02 DIAGNOSIS — Z955 Presence of coronary angioplasty implant and graft: Secondary | ICD-10-CM | POA: Diagnosis not present

## 2020-12-02 DIAGNOSIS — E785 Hyperlipidemia, unspecified: Secondary | ICD-10-CM | POA: Insufficient documentation

## 2020-12-02 DIAGNOSIS — N189 Chronic kidney disease, unspecified: Secondary | ICD-10-CM | POA: Diagnosis not present

## 2020-12-02 DIAGNOSIS — I480 Paroxysmal atrial fibrillation: Secondary | ICD-10-CM | POA: Diagnosis not present

## 2020-12-02 DIAGNOSIS — G4733 Obstructive sleep apnea (adult) (pediatric): Secondary | ICD-10-CM | POA: Insufficient documentation

## 2020-12-02 DIAGNOSIS — I5032 Chronic diastolic (congestive) heart failure: Secondary | ICD-10-CM

## 2020-12-02 DIAGNOSIS — F1290 Cannabis use, unspecified, uncomplicated: Secondary | ICD-10-CM | POA: Diagnosis not present

## 2020-12-02 DIAGNOSIS — Z7984 Long term (current) use of oral hypoglycemic drugs: Secondary | ICD-10-CM | POA: Diagnosis not present

## 2020-12-02 DIAGNOSIS — Z79899 Other long term (current) drug therapy: Secondary | ICD-10-CM | POA: Insufficient documentation

## 2020-12-02 DIAGNOSIS — E1122 Type 2 diabetes mellitus with diabetic chronic kidney disease: Secondary | ICD-10-CM | POA: Insufficient documentation

## 2020-12-02 DIAGNOSIS — I4892 Unspecified atrial flutter: Secondary | ICD-10-CM | POA: Diagnosis not present

## 2020-12-02 DIAGNOSIS — I2510 Atherosclerotic heart disease of native coronary artery without angina pectoris: Secondary | ICD-10-CM | POA: Diagnosis not present

## 2020-12-02 DIAGNOSIS — Z794 Long term (current) use of insulin: Secondary | ICD-10-CM | POA: Insufficient documentation

## 2020-12-02 DIAGNOSIS — Z87891 Personal history of nicotine dependence: Secondary | ICD-10-CM | POA: Diagnosis not present

## 2020-12-02 DIAGNOSIS — I130 Hypertensive heart and chronic kidney disease with heart failure and stage 1 through stage 4 chronic kidney disease, or unspecified chronic kidney disease: Secondary | ICD-10-CM | POA: Insufficient documentation

## 2020-12-02 LAB — BASIC METABOLIC PANEL
Anion gap: 10 (ref 5–15)
BUN: 27 mg/dL — ABNORMAL HIGH (ref 8–23)
CO2: 23 mmol/L (ref 22–32)
Calcium: 9.2 mg/dL (ref 8.9–10.3)
Chloride: 100 mmol/L (ref 98–111)
Creatinine, Ser: 1.59 mg/dL — ABNORMAL HIGH (ref 0.61–1.24)
GFR, Estimated: 48 mL/min — ABNORMAL LOW (ref >60)
Glucose, Bld: 443 mg/dL — ABNORMAL HIGH (ref 70–99)
Potassium: 4.1 mmol/L (ref 3.5–5.1)
Sodium: 133 mmol/L — ABNORMAL LOW (ref 135–145)

## 2020-12-02 LAB — CBC
HCT: 50.9 % (ref 39.0–52.0)
Hemoglobin: 15.8 g/dL (ref 13.0–17.0)
MCH: 23.8 pg — ABNORMAL LOW (ref 26.0–34.0)
MCHC: 31 g/dL (ref 30.0–36.0)
MCV: 76.5 fL — ABNORMAL LOW (ref 80.0–100.0)
Platelets: 220 10*3/uL (ref 150–400)
RBC: 6.65 MIL/uL — ABNORMAL HIGH (ref 4.22–5.81)
RDW: 20.8 % — ABNORMAL HIGH (ref 11.5–15.5)
WBC: 6 10*3/uL (ref 4.0–10.5)
nRBC: 0 % (ref 0.0–0.2)

## 2020-12-02 LAB — BRAIN NATRIURETIC PEPTIDE: B Natriuretic Peptide: 49.8 pg/mL (ref 0.0–100.0)

## 2020-12-02 MED ORDER — TORSEMIDE 20 MG PO TABS: ORAL_TABLET | ORAL | 3 refills | Status: DC

## 2020-12-02 NOTE — Patient Instructions (Addendum)
INCREASE Torsemide to 60 mg twice a day for 3 days  then 60 mg in the AM and 40 mg in the PM thereafter  Labs today We will only contact you if something comes back abnormal or we need to make some changes. Otherwise no news is good news!  Labs needed in 10 days  Your physician recommends that you schedule a follow-up appointment in: 1 month  in the Advanced Practitioners (PA/NP) Clinic   Do the following things EVERYDAY: 1) Weigh yourself in the morning before breakfast. Write it down and keep it in a log. 2) Take your medicines as prescribed 3) Eat low salt foods--Limit salt (sodium) to 2000 mg per day.  4) Stay as active as you can everyday 5) Limit all fluids for the day to less than 2 liters  At the [facility], you and your health needs are our priority. As part of our continuing mission to provide you with exceptional heart care, we have created designated Provider Care Teams. These Care Teams include your primary Cardiologist (physician) and Advanced Practice Providers (APPs- Physician Assistants and Nurse Practitioners) who all work together to provide you with the care you need, when you need it.   You may see any of the following providers on your designated Care Team at your next follow up: [name] Dr Glori Bickers . Dr Loralie Champagne . Dr Vickki Muff . Darrick Grinder, NP . Lyda Jester, [place] . Audry Riles, PharmD   Please be sure to bring in all your medications bottles to every appointment.   If you have any questions or concerns before your next appointment please send [place] a message through [place] or call our office at [phone].    TO LEAVE A MESSAGE FOR THE NURSE SELECT OPTION 2, PLEASE LEAVE A MESSAGE INCLUDING: . YOUR NAME . DATE OF BIRTH . CALL BACK NUMBER . REASON FOR CALL**this is important as we prioritize the call backs  YOU WILL RECEIVE A CALL BACK THE SAME DAY AS LONG AS YOU CALL BEFORE 4:00 PM

## 2020-12-04 NOTE — Progress Notes (Signed)
Patient ID: [name] [name], male   DOB: June 17, 1956, 65 y.o.   MRN: [id number] PCP: [name], MD Cardiology: Dr. Aundra Dubin  65 y.o. with history of HTN, diabetes, paroxysmal atrial fibrillation, and CAD presents for cardiology followup.  He was admitted in 5/13 with chest pain with exertion.  LHC was done showing global HK with EF 45% and diffuse, severe distal and branch vessel disease.  There was no interventional option, but it was suspect that this disease could be causing his anginal-type pain.  Echo at that time was read as showing EF 60-65%.  He was admitted in 5/14 with hypertensive urgency and chest pain.  He ruled out for MI and BP was controlled.  His EF was 50-55% by echo.  ETT-Sestamibi done as outpatient showed small fixed inferior defect with no ischemia but EF was 31%.  He was quite hypertensive during the study.  Cardiac MRI done to confirm EF in 5/14 showed EF 44%.  He had an episode of atrial fibrillation/RVR in 3/15 but went back into NSR.  Echo in 2/16 showed EF 55-60% with moderate diastolic dysfunction.   He had a Cardiolite in 9/16 showing inferior ischemia.  He was having exertional dyspnea with less activity than normal.  There was concern for worsened CAD and he was sent for [facility] in 9/16. By RHC, filling pressures were not significantly elevated. He had a severe distal RCA stenosis that was treated with DES x 2.  Echo showed EF 55-60%.    In 10/16, he was admitted with acute cholecystitis.  He received a cholecystostomy tube.  Cholecystectomy done in 6/17.   Atrial fibrillation in 1/17, cardioverted to NSR.   Recurrent concerning chest pain in 3/17.  Repeat LHC showed diffuse distal and branch vessel disease consistent with poorly-controlled DM, but patent RCA stents.    He was admitted with atrial fibrillation in 12/17.  Started on sotalol and converted to NSR.    He had recurrent chest pain in 5/18 with abnormal Cardiolite.  Cath was done again in 5/18,  showing diffuse CAD with no good interventional options.   Admitted 11/20 with increased dyspnea and exertional chest pain. Hgb went down to 8.1. GI consulted.  Had [place] that showed severe distal and branch vessel disease but no interventional targets and not good anatomy for CABG. Continued on medical management. Capsule endoscopy without recent or active bleeding, no AVMs. He went into atrial flutter during his hospitalization. He went back into NSR, however, after discharge.  +Snoring and daytime sleepiness.   He has had episodes of syncope associated with vigorous coughing.   He was in the ER with atrial flutter and dyspnea in 4/21, back in NSR today on sotalol.    Patient returns for followup of CHF.  He is not short of breath walking on flat ground but gets short of breath walking up stairs.  No chest pain.  He has chronic orthopnea and bendopnea.  One episode last week where he got lightheaded with standing but no syncope/presyncope.  Weight is down 4 lbs. Had sleep study, waiting for CPAP.   ECG (personally reviewed): NSR, 1st degree AVB, inferolateral ST-T changes. QTc 500 msec.   Cardiomems: PADP 32 mmHg (goal 24)  Labs (5/13): K 3.6, creatinine 0.93, LDL 85, HDL 57 Labs (7/13): K 3.9, creatinine 1.1, LDL 57, HDL 51  Labs (5/14): K 4, creatinine 1.25 Labs (8/14): K 3.7, creatinine 0.9, LDL 103, HDL 42 Labs (8/15): K 3.5 creatinine 1.0, BNP 75  Labs (2/16): K 3.8, creatinine 1.1 Labs (9/16): K 3.5, creatinine 1.03, BNP 59, HDL 40, LDL 111, TGs 333 Labs (11/16): K 3.6, creaitnine 1.07, LFTs normal, LDL 44 Labs (6/17): K 3.4, creatinine 1.52 => 1.13 Labs (1/18): K 3.9, creatinine 1.25 Labs (6/18): K 3.8, creatinine 1.21, HCT 39.7 Labs (8/18): LDL 39, HDL 43 Labs (12/18): 4.4, creatinine 1.25, LFTs normal Labs (8/19): K 3.6, creatinine 1.13, LDL 68, HDL 49  Labs (11/20): K 3.5, creatinine 1.33, LDL 25 Labs (4/21): K 4.0, SCr 1.14, BNP 266  Labs (6/21): K 3.7, creatinine 1.25 Labs  (12/21): LDL 25 Labs (1/22): BNP 132 Labs (2/22): K 3.4, creatinine 1.44  PMH: 1. DM2 2. Gout 3. HTN: Cough with ACEI.  4. Cardiomyopathy: Suspect mixed ischemic/nonischemic (?ETOH-related).  EF 35% in 2008.  Echo in 5/13 showed EF 60-65% with moderate LVH but EF was 45% on LV-gram in 5/13.  Echo (5/14) with EF 50-55%, mild LVH, inferobasal HK, mild MR.  ETT-Sestamibi in 5/14, however, showed EF 31%.  Cardiac MRI (6/14) with EF 44%, mild global hypokinesis, subepicardial delayed enhancement in a nonspecific RV insertion site pattern.  Echo (2/16) with EF 55-60%, moderate diastolic dysfunction. RHC (9/16) with mean RA 8, PA 39/20 mean 28, mean PCWP 14, CI 2.2. Echo (9/16) with EF 55-60%, grade II diastolic dysfunction.  - Echo (6/17) with EF 55-60%, moderate LVH, grade II diastolic dysfunction, mild AI, mild MR.  - RHC (11/20): mean RA 4, PA 46/14 mean 26, mean PCWP 13, CI 3.23 - Echo (9/20): EF 60-65%, moderate LVH, normal RV size and systolic function, mild-moderate MR, mild AI.  - Echo (5/21): EF 55%, RV normal - Has Cardiomems.  5. CAD: LHC in 2010 with mild nonobstructive disease.  LHC (5/13) with diffuse distal and branch vessel disease, mild global hypokinesis and EF 45%.  ETT-Sestamibi (5/14): EF 31%, small fixed inferior defect with no ischemia.  Lexiscan Cardiolite (9/16) with EF 26%, moderate inferior defect that was partially reversible, suggesting ischemia => High risk study. LHC (9/16) with 40-50% mLAD, diffuse up to 50% distal LAD, 90% ostium of branch off ramus, 95% dRCA => DES to RCA x 2, EF 55-60%.   - LHC (3/17) with diffuse branch and distal vessel disease c/w poorly controlled DM, RCA stents patent.  - LHC (5/18): Diffuse CAD with no good interventional option. 60% mRCA, 90% dLAD, 50% serial mid ramus stenoses, ramus branches with ostial 80-90% stenoses. - LHC (11/20): Severe distal and branch vessel disease but no interventional targets and not good anatomy for CABG  6. H/o  chronic HBV 7. Osteochondroma left shoulder.  8. Hyperlipidemia 9. Paroxysmal atrial fibrillation: Coumadin.  Developed cough and increased ESR with amiodarone.  DCCV in 1/17.  Recurrent atrial fibrillation in 12/17, sotalol started.  10. Atrial flutter: had ablations in 2007 and 2008.  - Zio patch in 4/21 with 20% atrial flutter.  11. OSA on CPAP.  12. Acute cholecystitis (10/16): Cholecystostomy tube placed.  Cholecystectomy 6/17.  13. CKD 14. Anemia - Suspected GI bleed but EGD/c-scope 9/20 without significant findings.  - Capsule Endoscopy (11/20) without recent or active bleeding, no AVMs.  15. Syncope: Suspected cough syncope.  16. OSA  SH: Married, prior smoker.  Some ETOH, occasionally heavy in past.  Does use occasional marijuana. Out of work Engineer, drilling. 2 daughters in grad school.   FH: CAD  ROS: All systems reviewed and negative except as per HPI.   Current Outpatient Medications  Medication Sig Dispense Refill  .  ACCU-CHEK SOFTCLIX LANCETS lancets Use as instructed for three times daily testing of blood glucose 100 each 12  . acetaminophen (TYLENOL) 500 MG tablet Take 1,000 mg by mouth every 6 (six) hours as needed (pain).    [name] albuterol (VENTOLIN HFA) 108 (90 Base) MCG/ACT inhaler Inhale 1-2 puffs into the lungs every 6 (six) hours as needed for wheezing or shortness of breath. 8 g 0  . amLODipine (NORVASC) 5 MG tablet Take 1 tablet by mouth once daily 90 tablet 3  . apixaban (ELIQUIS) 5 MG TABS tablet Take 1 tablet (5 mg total) by mouth 2 (two) times daily. 60 tablet 3  . atorvastatin (LIPITOR) 80 MG tablet Take 1 tablet (80 mg total) by mouth daily. 90 tablet 3  . blood glucose meter kit and supplies KIT Dispense based on patient and insurance preference. Use up to four times daily as directed. (FOR ICD-9 250.00, 250.01). 1 each 0  . Blood Glucose Monitoring Suppl (ACCU-CHEK AVIVA PLUS) w/Device KIT Use as directed for 3 times daily testing of blood glucose 1 kit 0  .  carvedilol (COREG) 25 MG tablet TAKE 1 TABLET BY MOUTH TWICE DAILY WITH A MEAL 180 tablet 3  . glucose blood (ACCU-CHEK AVIVA PLUS) test strip Use as instructed for 3 times daily testing of blood glucose 100 each 12  . guaiFENesin (MUCINEX PO) Take 1 tablet by mouth 2 (two) times daily as needed (cough/congestion).     . hydrALAZINE (APRESOLINE) 50 MG tablet TAKE 1 & 1/2 (ONE & ONE-HALF) TABLETS BY MOUTH TWICE DAILY 90 tablet 5  . insulin glargine (LANTUS SOLOSTAR) 100 UNIT/ML Solostar Pen Inject 35 Units into the skin every morning. And pen needles 1/day 30 mL 3  . isosorbide mononitrate (IMDUR) 120 MG 24 hr tablet Take 1 tablet by mouth once daily 60 tablet 0  . JARDIANCE 10 MG TABS tablet TAKE 1 TABLET BY MOUTH ONCE DAILY BEFORE BREAKFAST 30 tablet 1  . Lancet Devices (ACCU-CHEK SOFTCLIX) lancets Use as instructed for 3 times daily testing of blood glucose 1 each 0  . metFORMIN (GLUCOPHAGE) 1000 MG tablet TAKE 1 TABLET BY MOUTH TWICE DAILY WITH A MEAL 60 tablet 3  . nitroGLYCERIN (NITROSTAT) 0.4 MG SL tablet Place 1 tablet (0.4 mg total) under the tongue every 5 (five) minutes as needed for chest pain (x 3 tabs daily). 60 tablet 1  . sotalol (BETAPACE) 120 MG tablet Take 1 tablet (120 mg total) by mouth 2 (two) times daily. 180 tablet 3  . TRUEPLUS LANCETS 28G MISC 1 each by Does not apply route 3 (three) times daily. 100 each 12  . torsemide (DEMADEX) 20 MG tablet Take 3 tablets (60 mg total) by mouth 2 (two) times daily for 3 days, THEN 3 tablets (60 mg total) in the morning, THEN 2 tablets (40 mg total) every evening. 360 tablet 3   No current facility-administered medications for this encounter.    BP 124/70   Pulse 79   Wt 93 kg (205 lb)   SpO2 95%   BMI 27.80 kg/m   Wt Readings from Last 3 Encounters:  12/02/20 93 kg (205 lb)  09/07/20 95 kg (209 lb 6.4 oz)  07/20/20 95.6 kg (210 lb 12.8 oz)   PHYSICAL EXAM: General: NAD Neck: JVP 8 cm with HJR, no thyromegaly or thyroid  nodule.  Lungs: Clear to auscultation bilaterally with normal respiratory effort. CV: Nondisplaced PMI.  Heart regular S1/S2, no S3/S4, no murmur.  No peripheral edema.  No carotid bruit.  Normal pedal pulses.  Abdomen: Soft, nontender, no hepatosplenomegaly, no distention.  Skin: Intact without lesions or rashes.  Neurologic: Alert and oriented x 3.  Psych: Normal affect. Extremities: No clubbing or cyanosis.  HEENT: Normal.   Assessment/Plan:   1. Chronic diastolic CHF: EF 52% on cardiac MRI in 6/14 but EF improved back to 55-60% on 2/16 echo.  However, EF down to 26% on Cardiolite in 9/16.  Echo in 9/20 showed EF 60-65%, moderate LVH, normal RV, mild-moderate MR.  Echo in 5/21 with EF 55%, normal RV.  He has a Cardiomems device but has not been sending readings in regularly, last reading showed PADP 32 (goal 24).  He appears mildly volume overloaded on exam, NYHA class II-III symptoms.  - Increase torsemide to 60 mg bid x 3 days then 60 qam/40 qpm. Add KCl 40 daily.  BMET today and in 10 days.   - I encouraged him to send Cardiomems daily.   2. Atrial fibrillation/flutter: Paroxysmal. He has had atrial flutter ablations x 2 remotely.  He is on sotalol.  QTc ok on ECG today.  He tolerates atrial arrhythmias poorly, seems to worsen CHF.  Zio patch in 4/21 with 20% atrial flutter. He saw EP, repeat ablation was not recommended. He is in NSR today, no palpitations.  - Continue sotalol, QTc borderline but acceptable for now.   - Continue Eliquis.     3. Coronary artery disease: LHC in 9/16 showed diffuse CAD, most significant involving the distal RCA.  Patient had DES x 2 to RCA.  Cath in 3/17 with patent RCA stents and diffuse distal and branch vessel disease concerning for poorly controlled diabetes.  Cath in 5/18 again showed diffuse CAD without good interventional target.  Cath in 11/20 showed severe distal and branch vessel disease but no interventional targets and not good anatomy for CABG.  No  chest pain currently.  - No ASA given Eliquis use.  - Continue atorvastatin 80 mg daily, check lipids today.  - Continue Coreg, amlodipine, and Imdur 120 mg daily for angina control.  - Avoid ranolazine with sotalol use.  4. Hyperlipidemia:  On atorvastatin, good lipids 12/21.  5. Hypertension: Controlled on current regimen.   6. OSA: Needs CPAP.  7. CKD: BMET today.   Followup 1 month with NP/PA to reassess volume.  Loralie Champagne 12/04/2020

## 2020-12-05 ENCOUNTER — Other Ambulatory Visit (HOSPITAL_COMMUNITY): Payer: Self-pay | Admitting: Internal Medicine

## 2020-12-06 ENCOUNTER — Telehealth: Payer: Self-pay | Admitting: Physician Assistant

## 2020-12-06 ENCOUNTER — Telehealth (HOSPITAL_COMMUNITY): Payer: Self-pay | Admitting: Adult Health

## 2020-12-06 MED ORDER — APIXABAN 5 MG PO TABS: 5.0000 mg | ORAL_TABLET | Freq: Two times a day (BID) | ORAL | 11 refills | Status: DC

## 2020-12-06 NOTE — Telephone Encounter (Signed)
Left message to send cardiomems reading.  Rever Pichette 12:00 PM

## 2020-12-06 NOTE — Telephone Encounter (Signed)
   Dtr called because Mr Triggs has been out of his Eliquis x 4 days.   He is not having any stroke symptoms.  He has been feeling bad since he saw Dr Aundra Dubin, but blood sugars have been over 400 at times.  He has taken his insulin and his blood sugars are starting to come down, in the 300s.  He is not having new shortness of breath or lower extremity edema.  Their regular pharmacy is closed tonight, but she volunteers to go to a different pharmacy to pick it up.  I sent in a new prescription to the [facility] on [address] which is a 24-hour pharmacy.  This prescription can be transferred back to [facility] once she picks it up.  Rosaria Ferries, PA-C 12/06/2020 7:32 PM

## 2020-12-11 ENCOUNTER — Other Ambulatory Visit (HOSPITAL_COMMUNITY): Payer: Self-pay | Admitting: Cardiology

## 2020-12-11 DIAGNOSIS — I10 Essential (primary) hypertension: Secondary | ICD-10-CM

## 2020-12-12 ENCOUNTER — Ambulatory Visit (HOSPITAL_COMMUNITY)
Admission: RE | Admit: 2020-12-12 | Discharge: 2020-12-12 | Disposition: A | Discharge status: Home / Self Care | Payer: Medicare Other | Source: Ambulatory Visit | Attending: Family Medicine | Admitting: Family Medicine

## 2020-12-12 ENCOUNTER — Other Ambulatory Visit: Payer: Self-pay

## 2020-12-12 ENCOUNTER — Ambulatory Visit (INDEPENDENT_AMBULATORY_CARE_PROVIDER_SITE_OTHER): Payer: Medicare Other | Admitting: Family Medicine

## 2020-12-12 ENCOUNTER — Encounter: Payer: Self-pay | Admitting: Family Medicine

## 2020-12-12 VITALS — BP 124/62 | HR 74 | Temp 98.2°F | Wt 202.2 lb

## 2020-12-12 DIAGNOSIS — R1031 Right lower quadrant pain: Secondary | ICD-10-CM | POA: Diagnosis not present

## 2020-12-12 DIAGNOSIS — I5032 Chronic diastolic (congestive) heart failure: Secondary | ICD-10-CM | POA: Diagnosis not present

## 2020-12-12 DIAGNOSIS — R42 Dizziness and giddiness: Secondary | ICD-10-CM

## 2020-12-12 DIAGNOSIS — R195 Other fecal abnormalities: Secondary | ICD-10-CM

## 2020-12-12 DIAGNOSIS — Z794 Long term (current) use of insulin: Secondary | ICD-10-CM

## 2020-12-12 DIAGNOSIS — E1165 Type 2 diabetes mellitus with hyperglycemia: Secondary | ICD-10-CM | POA: Diagnosis not present

## 2020-12-12 LAB — CBC WITH DIFFERENTIAL/PLATELET
Basophils Absolute: 0 10*3/uL (ref 0.0–0.1)
Basophils Relative: 0.8 % (ref 0.0–3.0)
Eosinophils Absolute: 0.2 10*3/uL (ref 0.0–0.7)
Eosinophils Relative: 2.7 % (ref 0.0–5.0)
HCT: 47.3 % (ref 39.0–52.0)
Hemoglobin: 15 g/dL (ref 13.0–17.0)
Lymphocytes Relative: 33.9 % (ref 12.0–46.0)
Lymphs Abs: 2 10*3/uL (ref 0.7–4.0)
MCHC: 31.8 g/dL (ref 30.0–36.0)
MCV: 75.4 fl — ABNORMAL LOW (ref 78.0–100.0)
Monocytes Absolute: 0.7 10*3/uL (ref 0.1–1.0)
Monocytes Relative: 12.4 % — ABNORMAL HIGH (ref 3.0–12.0)
Neutro Abs: 3 10*3/uL (ref 1.4–7.7)
Neutrophils Relative %: 50.2 % (ref 43.0–77.0)
Platelets: 163 10*3/uL (ref 150.0–400.0)
RBC: 6.27 Mil/uL — ABNORMAL HIGH (ref 4.22–5.81)
RDW: 19.9 % — ABNORMAL HIGH (ref 11.5–15.5)
WBC: 5.9 10*3/uL (ref 4.0–10.5)

## 2020-12-12 LAB — HEPATIC FUNCTION PANEL
ALT: 17 U/L (ref 0–53)
AST: 16 U/L (ref 0–37)
Albumin: 3.9 g/dL (ref 3.5–5.2)
Alkaline Phosphatase: 70 U/L (ref 39–117)
Bilirubin, Direct: 0.1 mg/dL (ref 0.0–0.3)
Total Bilirubin: 0.5 mg/dL (ref 0.2–1.2)
Total Protein: 7.3 g/dL (ref 6.0–8.3)

## 2020-12-12 LAB — POCT URINALYSIS DIPSTICK
Bilirubin, UA: NEGATIVE
Blood, UA: NEGATIVE
Glucose, UA: POSITIVE — AB
Ketones, UA: NEGATIVE
Leukocytes, UA: NEGATIVE
Nitrite, UA: NEGATIVE
Protein, UA: NEGATIVE
Spec Grav, UA: 1.015 (ref 1.010–1.025)
Urobilinogen, UA: NEGATIVE E.U./dL — AB
pH, UA: 6 (ref 5.0–8.0)

## 2020-12-12 LAB — BASIC METABOLIC PANEL
Anion gap: 12 (ref 5–15)
BUN: 25 mg/dL — ABNORMAL HIGH (ref 8–23)
CO2: 26 mmol/L (ref 22–32)
Calcium: 9.4 mg/dL (ref 8.9–10.3)
Chloride: 100 mmol/L (ref 98–111)
Creatinine, Ser: 1.74 mg/dL — ABNORMAL HIGH (ref 0.61–1.24)
GFR, Estimated: 43 mL/min — ABNORMAL LOW (ref >60)
Glucose, Bld: 207 mg/dL — ABNORMAL HIGH (ref 70–99)
Potassium: 3.5 mmol/L (ref 3.5–5.1)
Sodium: 138 mmol/L (ref 135–145)

## 2020-12-12 LAB — T4, FREE: Free T4: 0.93 ng/dL (ref 0.60–1.60)

## 2020-12-12 LAB — TSH: TSH: 0.86 u[IU]/mL (ref 0.35–4.50)

## 2020-12-12 LAB — HEMOGLOBIN A1C: Hgb A1c MFr Bld: 11.9 % — ABNORMAL HIGH (ref 4.6–6.5)

## 2020-12-12 LAB — LIPASE: Lipase: 88 U/L — ABNORMAL HIGH (ref 11.0–59.0)

## 2020-12-12 NOTE — Progress Notes (Signed)
Subjective:    Patient ID: [name] [name], male    DOB: May 01, 1956, 65 y.o.   MRN: [id number]  Chief Complaint  Patient presents with  . Abdominal Pain    HPI Patient was seen today for ongoing concern.  Pt endorses abd pain, dizziness, nausea, and weakness x 10 days.  Starting to feel better.  FSBS at onset of sx was 600.  Pt was drinking chocolate milk, 1 qt/d as he did not feel like eating 2/2 nausea.  Having 3-4 loose stools/day.  BS decreasing, 220 and 120.  Taking lantus 35 u, metformin 1000 mg BID, and jardiance 10 mg.  Ran out of Eliquis x several days.  Has not been taking potassium supplement as the pills made nausea worse.  Past Medical History:  Diagnosis Date  . Anginal pain ([place])   . Anxiety   . Arrhythmia   . CAD (coronary artery disease), native coronary artery    a. Nonobstructive CAD by cath 2013 - diffuse distal and branch vessel CAD, no severe disease in the major coronaries, LV mild global hypokinesis, EF 45%. b. ETT-Sestamibi 5/14: EF 31%, small fixed inferior defect with no ischemia.  . Cholecystitis   . Chronic CHF ([place])    a. Mixed ICM/NICM (?EtOH). EF 35% in 2008. Echo 5/13: EF 60-65%, mod LVH, EF 45% on V gram in 12/2011. EF 12/2012: EF 50-55%, mild LVH, inferobasal HK, mild MR. ETT-Ses 5/14 EF 41%. Cardiac MRI 5/14: EF 44%, mild global HK, subepicardial delayed enhancement in nonspecific RV insertion pattern.  . COLONIC POLYPS, HX OF 12/30/2006  . Gout   . H/O atrial flutter 2007   a. Ablations in 2007, 2008.  [name] Heart murmur   . HEPATITIS B, CHRONIC 12/30/2006  . History of alcohol abuse   . History of hiatal hernia   . History of medication noncompliance   . History of nuclear stress test    Myoview 3/17: + chest pain; EF 33%, downsloping ST depression 2, V4-6, LVH with strain, no ischemia on images; intermediate risk   . HYPERCHOLESTEROLEMIA 07/11/2010  . Hypertension   . Left sciatic nerve pain since 04/2015  . LIVER FUNCTION TESTS, ABNORMAL, HX OF  12/30/2006  . MITRAL REGURGITATION 12/30/2006  . 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 PAF (paroxysmal atrial fibrillation) ([facility])    On coumadin  . Rectal bleeding 12/18/2011   Scheduled for colonoscopy.    . Shortness of breath dyspnea    with excertion  . Sleep apnea    "suppose to send mask but they never did" (05/03/2015)  . Type II diabetes mellitus (H[facility])     Allergies  Allergen Reactions  . Shrimp [Shellfish Allergy] Nausea And Vomiting  . Ace Inhibitors Cough  . Other Hives and Other (See Comments)    Patient reports developing hives after receiving "some antibiotic given in 1980''s at [facility]". He does not know which antibiotic.   Family History  Problem Relation Age of Onset  . Diabetes Mother   . Hypertension Mother   . Heart attack Neg Hx   . Stroke Neg Hx     ROS General: Denies fever, chills, night sweats, changes in weight, changes in appetite +dizzziness, weakness, hyperglycemia HEENT: Denies headaches, ear pain, changes in vision, rhinorrhea, sore throat CV: Denies CP, palpitations, SOB, orthopnea Pulm: Denies SOB, cough, wheezing GI: Denies vomiting, constipation +loose stools, nausea, abdominal pain. GU: Denies dysuria, hematuria, frequency Msk: Denies muscle cramps, joint pains Neuro: Denies weakness,  numbness, tingling Skin: Denies rashes, bruising Psych: Denies depression, anxiety, hallucinations    Objective:    Blood pressure 124/62, pulse 74, temperature 98.2 F (36.8 C), temperature source Oral, weight 202 lb 3.2 oz (91.7 kg), SpO2 98 %.  Gen. Pleasant, well-nourished, in no distress, normal affect   HEENT: [place]/AT, face symmetric, conjunctiva clear, no scleral icterus, PERRLA, EOMI, nares patent without drainage Lungs: no accessory muscle use, CTAB, no wheezes or rales Cardiovascular: RRR, no m/r/g, no peripheral edema Abdomen: BS present, soft, TTP in RLQ, ND, no hepatosplenomegaly. Musculoskeletal: No deformities, no  cyanosis or clubbing, normal tone Neuro:  A&Ox3, CN II-XII intact, normal gait Skin:  Warm, no lesions/ rash   Wt Readings from Last 3 Encounters:  12/12/20 202 lb 3.2 oz (91.7 kg)  12/02/20 205 lb (93 kg)  09/07/20 209 lb 6.4 oz (95 kg)    Lab Results  Component Value Date   WBC 6.0 12/02/2020   HGB 15.8 12/02/2020   HCT 50.9 12/02/2020   PLT 220 12/02/2020   GLUCOSE 207 (H) 12/12/2020   CHOL 124 07/15/2020   TRIG 116 07/15/2020   HDL 43 07/15/2020   LDLDIRECT 111.0 04/22/2015   LDLCALC 58 07/15/2020   ALT 12 07/20/2020   AST 13 07/20/2020   NA 138 12/12/2020   K 3.5 12/12/2020   CL 100 12/12/2020   CREATININE 1.74 (H) 12/12/2020   BUN 25 (H) 12/12/2020   CO2 26 12/12/2020   TSH 1.15 07/20/2020   PSA 0.91 03/30/2010   INR 1.2 04/24/2019   HGBA1C 9.8 (A) 07/20/2020   MICROALBUR 1.7 07/20/2020    Assessment/Plan:  Type 2 diabetes mellitus with hyperglycemia, with long-term current use of insulin ([place]) -uncontrolled over the last few wks likely due increased intake of chocolate milk -hgb A1C 9.8 on 07/20/20 -continue current medications.  Will increase insulin when pt eating more. -pt advised against drinking chocolate milk daily -discussed the importance of checking fsbs regularly -foot exam up to date.  - Plan: Hemoglobin A1c, Ambulatory referral to diabetic education  Dizziness  -improving -likely due to hyperglycemia, poor po intake/dehydration -orthostatic bps and labs -increase po hydration and intake as tolerated. - Plan: CBC with Differential/Platelet, Hemoglobin A1c, TSH, T4, Free, Orthostatic vital signs  Right lower quadrant abdominal pain  -consider possible causes including diverticulitis, referred pain, appendicitis, loose stools -will obtain labs and UA -imaging if needed, based on results. - Plan: Hepatic function panel, Lipase, POCT urinalysis dipstick  Loose stools  -improving -possibly from hyperglycemia, decreased po intake of food.   Consider gallbladder dysfunction. -OTC probiotic daily -bland diet, advance as tolerated. - Plan: Hepatic function panel, Lipase, TSH, T4, Free  F/u in the next few days  Grier Mitts, MD

## 2020-12-12 NOTE — Patient Instructions (Signed)
Abdominal Pain, Adult Pain in the abdomen (abdominal pain) can be caused by many things. Often, abdominal pain is not serious and it gets better with no treatment or by being treated at home. However, sometimes abdominal pain is serious. Your health care provider will ask questions about your medical history and do a physical exam to try to determine the cause of your abdominal pain. Follow these instructions at home: Medicines  Take over-the-counter and prescription medicines only as told by your health care provider.  Do not take a laxative unless told by your health care provider. General instructions  Watch your condition for any changes.  Drink enough fluid to keep your urine pale yellow.  Keep all follow-up visits as told by your health care provider. This is important.   Contact a health care provider if:  Your abdominal pain changes or gets worse.  You are not hungry or you lose weight without trying.  You are constipated or have diarrhea for more than 2-3 days.  You have pain when you urinate or have a bowel movement.  Your abdominal pain wakes you up at night.  Your pain gets worse with meals, after eating, or with certain foods.  You are vomiting and cannot keep anything down.  You have a fever.  You have blood in your urine. Get help right away if:  Your pain does not go away as soon as your health care provider told you to expect.  You cannot stop vomiting.  Your pain is only in areas of the abdomen, such as the right side or the left lower portion of the abdomen. Pain on the right side could be caused by appendicitis.  You have bloody or black stools, or stools that look like tar.  You have severe pain, cramping, or bloating in your abdomen.  You have signs of dehydration, such as: ? Dark urine, very little urine, or no urine. ? Cracked lips. ? Dry mouth. ? Sunken eyes. ? Sleepiness. ? Weakness.  You have trouble breathing or chest  pain. Summary  Often, abdominal pain is not serious and it gets better with no treatment or by being treated at home. However, sometimes abdominal pain is serious.  Watch your condition for any changes.  Take over-the-counter and prescription medicines only as told by your health care provider.  Contact a health care provider if your abdominal pain changes or gets worse.  Get help right away if you have severe pain, cramping, or bloating in your abdomen. This information is not intended to replace advice given to you by your health care provider. Make sure you discuss any questions you have with your health care provider. Document Revised: 09/18/2019 Document Reviewed: 12/08/2018 Elsevier Patient Education  2021 [place]. Dizziness Dizziness is a common problem. It is a feeling of unsteadiness or light-headedness. You may feel like you are about to faint. Dizziness can lead to injury if you stumble or fall. Anyone can become dizzy, but dizziness is more common in older adults. This condition can be caused by a number of things, including medicines, dehydration, or illness. Follow these instructions at home: Eating and drinking  Drink enough fluid to keep your urine clear or pale yellow. This helps to keep you from becoming dehydrated. Try to drink more clear fluids, such as water.  Do not drink alcohol.  Limit your caffeine intake if told to do so by your health care provider. Check ingredients and nutrition facts to see if a food or beverage  contains caffeine.  Limit your salt (sodium) intake if told to do so by your health care provider. Check ingredients and nutrition facts to see if a food or beverage contains sodium. Activity  Avoid making quick movements. ? Rise slowly from chairs and steady yourself until you feel okay. ? In the morning, first sit up on the side of the bed. When you feel okay, stand slowly while you hold onto something until you know that your balance is  fine.  If you need to stand in one place for a long time, move your legs often. Tighten and relax the muscles in your legs while you are standing.  Do not drive or use heavy machinery if you feel dizzy.  Avoid bending down if you feel dizzy. Place items in your home so that they are easy for you to reach without leaning over. Lifestyle  Do not use any products that contain nicotine or tobacco, such as cigarettes and e-cigarettes. If you need help quitting, ask your health care provider.  Try to reduce your stress level by using methods such as yoga or meditation. Talk with your health care provider if you need help to manage your stress. General instructions  Watch your dizziness for any changes.  Take over-the-counter and prescription medicines only as told by your health care provider. Talk with your health care provider if you think that your dizziness is caused by a medicine that you are taking.  Tell a friend or a family member that you are feeling dizzy. If he or she notices any changes in your behavior, have this person call your health care provider.  Keep all follow-up visits as told by your health care provider. This is important. Contact a health care provider if:  Your dizziness does not go away.  Your dizziness or light-headedness gets worse.  You feel nauseous.  You have reduced hearing.  You have new symptoms.  You are unsteady on your feet or you feel like the room is spinning. Get help right away if:  You vomit or have diarrhea and are unable to eat or drink anything.  You have problems talking, walking, swallowing, or using your arms, hands, or legs.  You feel generally weak.  You are not thinking clearly or you have trouble forming sentences. It may take a friend or family member to notice this.  You have chest pain, abdominal pain, shortness of breath, or sweating.  Your vision changes.  You have any bleeding.  You have a severe headache.  You  have neck pain or a stiff neck.  You have a fever. These symptoms may represent a serious problem that is an emergency. Do not wait to see if the symptoms will go away. Get medical help right away. Call your local emergency services (911 in the [place] Do not drive yourself to the hospital. Summary  Dizziness is a feeling of unsteadiness or light-headedness. This condition can be caused by a number of things, including medicines, dehydration, or illness.  Anyone can become dizzy, but dizziness is more common in older adults.  Drink enough fluid to keep your urine clear or pale yellow. Do not drink alcohol.  Avoid making quick movements if you feel dizzy. Monitor your dizziness for any changes. This information is not intended to replace advice given to you by your health care provider. Make sure you discuss any questions you have with your health care provider. Document Revised: 08/02/2017 Document Reviewed: 09/01/2016 Elsevier Patient Education  2021 [employer]  Hyperglycemia Hyperglycemia is when the sugar (glucose) level in your blood is too high. High blood sugar can happen to people who have or do not have diabetes. High blood sugar can happen quickly. It can be an emergency. What are the causes? If you have diabetes, high blood sugar may be caused by:  Medicines that increase blood sugar or affect your control of diabetes.  Getting less physical activity.  Overeating.  Being sick or injured or having an infection.  Having surgery.  Stress.  Not giving yourself enough insulin (if you are taking it). You may have high blood sugar because you have diabetes that has not been diagnosed yet. If you do not have diabetes, high blood sugar may be caused by:  Certain medicines.  Stress.  A bad illness.  An infection.  Having surgery.  Diseases of the pancreas. What increases the risk? This condition is more likely to develop in people who have risk factors for  diabetes, such as:  Having a family member with diabetes.  Certain conditions in which the body's defense system (immune system) attacks itself. These are called autoimmune disorders.  Being overweight.  Not being active.  Having a condition called insulin resistance.  Having a history of: ? Prediabetes. ? Diabetes when pregnant. ? Polycystic ovarian syndrome (PCOS). What are the signs or symptoms? This condition may not cause symptoms. If you do have symptoms, they may include:  Feeling more thirsty than normal.  Needing to pee (urinate) more often than normal.  Hunger.  Feeling very tired.  Blurry eyesight (vision). You may get other symptoms as the condition gets worse, such as:  Dry mouth.  Pain in your belly (abdomen).  Not being hungry (loss of appetite).  Breath that smells fruity.  Weakness.  Weight loss that is not planned.  A tingling or numb feeling in your hands or feet.  A headache.  Cuts or bruises that heal slowly. How is this treated? Treatment depends on the cause of your condition. Treatment may include:  Taking medicine to control your blood sugar levels.  Changing your medicine or dosage if you take insulin or other diabetes medicines.  Lifestyle changes. These may include: ? Exercising more. ? Eating healthier foods. ? Losing weight.  Treating an illness or infection.  Checking your blood sugar more often.  Stopping or reducing steroid medicines. If your condition gets very bad, you will need to be treated in the hospital. Follow these instructions at home: General instructions  Take over-the-counter and prescription medicines only as told by your doctor.  Do not smoke or use any products that contain nicotine or tobacco. If you need help quitting, ask your doctor.  If you drink alcohol: ? Limit how much you have to:  0-1 drink a day for women who are not pregnant.  0-2 drinks a day for men. ? Know how much alcohol is  in a drink. In the [place] one drink equals one 12 oz bottle of beer (355 mL), one 5 oz glass of wine (148 mL), or one 1 oz glass of hard liquor (44 mL).  Manage stress. If you need help with this, ask your doctor.  Do exercises as told by your doctor.  Keep all follow-up visits. Eating and drinking  Stay at a healthy weight.  Make sure you drink enough fluid when you: ? Exercise. ? Get sick. ? Are in hot temperatures.  Drink enough fluid to keep your pee (urine) pale yellow.  If you have diabetes:  Know the symptoms of high blood sugar.  Follow your diabetes management plan as told by your doctor. Make sure you: ? Take insulin and medicines as told. ? Follow your exercise plan. ? Follow your meal plan. Eat on time. Do not skip meals. ? Check your blood sugar as often as told. Make sure you check before and after exercise. If you exercise longer or in a different way, check your blood sugar more often. ? Follow your sick day plan whenever you cannot eat or drink normally. Make this plan ahead of time with your doctor.  Share your diabetes management plan with people in your workplace, school, and household.  Check your pee for ketones when you are ill and as told by your doctor.  Carry a card or wear jewelry that says that you have diabetes.   Where to find more information American Diabetes Association: www.diabetes.org Contact a doctor if:  Your blood sugar level is at or above 240 mg/dL (13.3 mmol/L) for 2 days in a row.  You have problems keeping your blood sugar in your target range.  You have high blood pressure often.  You have signs of illness, such as: ? Feeling like you may vomit (feeling nauseous). ? Vomiting. ? A fever. Get help right away if:  Your blood sugar monitor reads "high" even when you are taking insulin.  You have trouble breathing.  You have a change in how you think, feel, or act (mental status).  You feel like you may vomit, and the  feeling does not go away.  You cannot stop vomiting. These symptoms may be an emergency. Get medical help right away. Call your local emergency services (911 in the [place]  Do not wait to see if the symptoms will go away.  Do not drive yourself to the hospital. Summary  Hyperglycemia is when the sugar (glucose) level in your blood is too high.  High blood sugar can happen to people who have or do not have diabetes.  Make sure you drink enough fluids and follow your meal plan. Exercise as often as told by your doctor.  Contact your doctor if you have problems keeping your blood sugar in your target range. This information is not intended to replace advice given to you by your health care provider. Make sure you discuss any questions you have with your health care provider. Document Revised: 05/13/2020 Document Reviewed: 05/13/2020 Elsevier Patient Education  2021 [employer].

## 2020-12-19 ENCOUNTER — Other Ambulatory Visit (HOSPITAL_COMMUNITY): Payer: Self-pay | Admitting: Cardiology

## 2020-12-19 DIAGNOSIS — I5032 Chronic diastolic (congestive) heart failure: Secondary | ICD-10-CM

## 2020-12-20 ENCOUNTER — Telehealth: Payer: Self-pay | Admitting: Family Medicine

## 2020-12-20 NOTE — Progress Notes (Signed)
Patient viewed results on MyChart. 

## 2020-12-20 NOTE — Telephone Encounter (Signed)
Ok for referral?

## 2020-12-20 NOTE — Telephone Encounter (Signed)
The patients daughter called in reference to a referral for endocrinology. She would like to see if they can get a glucose pump or a monitor that stays in the arm.  They do not want to be referred to Dr. Loanne Drilling

## 2020-12-21 ENCOUNTER — Encounter: Payer: Self-pay | Admitting: *Deleted

## 2020-12-21 ENCOUNTER — Other Ambulatory Visit: Payer: Self-pay

## 2020-12-21 DIAGNOSIS — E1165 Type 2 diabetes mellitus with hyperglycemia: Secondary | ICD-10-CM

## 2020-12-21 DIAGNOSIS — E119 Type 2 diabetes mellitus without complications: Secondary | ICD-10-CM

## 2020-12-21 DIAGNOSIS — E118 Type 2 diabetes mellitus with unspecified complications: Secondary | ICD-10-CM

## 2020-12-21 DIAGNOSIS — Z794 Long term (current) use of insulin: Secondary | ICD-10-CM

## 2020-12-21 NOTE — Telephone Encounter (Signed)
After 3 attempts I have sent a no contact letter to the patient asking him to contact our office.

## 2020-12-21 NOTE — Telephone Encounter (Signed)
Referral placed for Endo.

## 2021-01-02 ENCOUNTER — Telehealth (HOSPITAL_COMMUNITY): Payer: Self-pay | Admitting: Adult Health

## 2021-01-02 MED ORDER — TORSEMIDE 20 MG PO TABS: 60.0000 mg | ORAL_TABLET | Freq: Two times a day (BID) | ORAL | 6 refills | Status: DC

## 2021-01-02 NOTE — Telephone Encounter (Signed)
[facility] Reading 28  Goal 24   He reports eating lots of fast food.   I have asked him to increase torsemide 60 mg twice a day and start Kdur 20 meq twice a day.    Please set up BMET next week.   Chamara Dyck NP-C  1:12 PM

## 2021-01-02 NOTE — Telephone Encounter (Signed)
Pt is scheduled for a routine follow up 01/04/21, will add to upcoming appt

## 2021-01-04 ENCOUNTER — Encounter (HOSPITAL_COMMUNITY): Payer: Medicare Other

## 2021-01-19 ENCOUNTER — Other Ambulatory Visit (HOSPITAL_COMMUNITY): Payer: Self-pay | Admitting: Cardiology

## 2021-01-30 ENCOUNTER — Other Ambulatory Visit (HOSPITAL_COMMUNITY): Payer: Self-pay | Admitting: Internal Medicine

## 2021-02-01 ENCOUNTER — Other Ambulatory Visit (HOSPITAL_COMMUNITY): Payer: Self-pay | Admitting: *Deleted

## 2021-02-01 DIAGNOSIS — I5032 Chronic diastolic (congestive) heart failure: Secondary | ICD-10-CM

## 2021-02-01 MED ORDER — TORSEMIDE 20 MG PO TABS: 60.0000 mg | ORAL_TABLET | Freq: Two times a day (BID) | ORAL | 0 refills | Status: DC

## 2021-02-01 MED ORDER — HYDRALAZINE HCL 50 MG PO TABS: ORAL_TABLET | ORAL | 5 refills | Status: DC

## 2021-02-01 NOTE — Addendum Note (Signed)
Addended by: Emerson Monte B on: 02/01/2021 12:17 PM   Modules accepted: Orders

## 2021-02-17 ENCOUNTER — Ambulatory Visit: Payer: Medicare Other | Admitting: Dietician

## 2021-02-27 ENCOUNTER — Other Ambulatory Visit: Payer: Self-pay

## 2021-02-27 ENCOUNTER — Telehealth: Payer: Self-pay | Admitting: Endocrinology

## 2021-02-27 ENCOUNTER — Ambulatory Visit (INDEPENDENT_AMBULATORY_CARE_PROVIDER_SITE_OTHER): Payer: Medicare Other | Admitting: Endocrinology

## 2021-02-27 ENCOUNTER — Encounter: Payer: Self-pay | Admitting: Endocrinology

## 2021-02-27 VITALS — BP 118/76 | HR 75 | Ht 72.0 in | Wt 205.0 lb

## 2021-02-27 DIAGNOSIS — E1169 Type 2 diabetes mellitus with other specified complication: Secondary | ICD-10-CM

## 2021-02-27 DIAGNOSIS — Z794 Long term (current) use of insulin: Secondary | ICD-10-CM | POA: Diagnosis not present

## 2021-02-27 DIAGNOSIS — E119 Type 2 diabetes mellitus without complications: Secondary | ICD-10-CM

## 2021-02-27 LAB — POCT GLYCOSYLATED HEMOGLOBIN (HGB A1C): Hemoglobin A1C: 9 % — AB (ref 4.0–5.6)

## 2021-02-27 MED ORDER — FREESTYLE LIBRE 2 SENSOR MISC: 1.0000 | 3 refills | Status: DC

## 2021-02-27 MED ORDER — FREESTYLE LIBRE 2 READER DEVI: 1.0000 | Freq: Once | 1 refills | Status: DC

## 2021-02-27 MED ORDER — LANTUS SOLOSTAR 100 UNIT/ML ~~LOC~~ SOPN: 40.0000 [IU] | PEN_INJECTOR | SUBCUTANEOUS | 3 refills | Status: DC

## 2021-02-27 MED ORDER — METFORMIN HCL 1000 MG PO TABS: 1000.0000 mg | ORAL_TABLET | Freq: Every day | ORAL | 3 refills | Status: DC

## 2021-02-27 NOTE — Progress Notes (Signed)
Subjective:    Patient ID: [name] [name], male    DOB: July 04, 1956, 65 y.o.   MRN: [id number]  HPI pt is referred by Dr Volanda Napoleon, for diabetes.  Pt states DM was dx'ed in 2683; it is complicated by CAD and stage 3 CRI.  he has been on insulin since 2013; pt says his diet and exercise are not good; he has never had pancreatitis, pancreatic surgery, severe hypoglycemia or DKA.  Pt says he sometimes misses insulin doses, despite low copay.  He lives alone.  He says he checks cbg approx qod.  He says cbg varies from 145-600.  It is in general higher as the day goes on.  He takes lantus 35 units qam, and 2 oral meds.   Past Medical History:  Diagnosis Date   Anginal pain ([place])    Anxiety    Arrhythmia    CAD (coronary artery disease), native coronary artery    a. Nonobstructive CAD by cath 2013 - diffuse distal and branch vessel CAD, no severe disease in the major coronaries, LV mild global hypokinesis, EF 45%. b. ETT-Sestamibi 5/14: EF 31%, small fixed inferior defect with no ischemia.   Cholecystitis    Chronic CHF ([place])    a. Mixed ICM/NICM (?EtOH). EF 35% in 2008. Echo 5/13: EF 60-65%, mod LVH, EF 45% on V gram in 12/2011. EF 12/2012: EF 50-55%, mild LVH, inferobasal HK, mild MR. ETT-Ses 5/14 EF 41%. Cardiac MRI 5/14: EF 44%, mild global HK, subepicardial delayed enhancement in nonspecific RV insertion pattern.   COLONIC POLYPS, HX OF 12/30/2006   Gout    H/O atrial flutter 2007   a. Ablations in 2007, 2008.   Heart murmur    HEPATITIS B, CHRONIC 12/30/2006   History of alcohol abuse    History of hiatal hernia    History of medication noncompliance    History of nuclear stress test    Myoview 3/17: + chest pain; EF 33%, downsloping ST depression 2, V4-6, LVH with strain, no ischemia on images; intermediate risk    HYPERCHOLESTEROLEMIA 07/11/2010   Hypertension    Left sciatic nerve pain since 04/2015   LIVER FUNCTION TESTS, ABNORMAL, HX OF 12/30/2006   MITRAL REGURGITATION 12/30/2006    Osteochondrosarcoma ([place]) 1972   "left shoulder"   PAF (paroxysmal atrial fibrillation) ([place])    On coumadin   Rectal bleeding 12/18/2011   Scheduled for colonoscopy.     Shortness of breath dyspnea    with excertion   Sleep apnea    "suppose to send mask but they never did" (05/03/2015)   Type II diabetes mellitus [facility])     Past Surgical History:  Procedure Laterality Date   A FLUTTER ABLATION  2007, 2008   catheter ablation    CARDIAC CATHETERIZATION N/A 05/03/2015   Procedure: Right/Left Heart Cath and Coronary Angiography;  Surgeon: Larey Dresser, MD;  Location: [place] [facility];  Service: Cardiovascular;  Laterality: N/A;   CARDIAC CATHETERIZATION N/A 05/03/2015   Procedure: Coronary Stent Intervention;  Surgeon: Jettie Booze, MD;  Location: [place] [facility];  Service: Cardiovascular;  Laterality: N/A;   CARDIAC CATHETERIZATION N/A 11/04/2015   Procedure: Left Heart Cath and Coronary Angiography;  Surgeon: Larey Dresser, MD;  Location: [place] [facility];  Service: Cardiovascular;  Laterality: N/A;   CARDIOVERSION N/A 08/24/2015   Procedure: CARDIOVERSION;  Surgeon: Thayer Headings, MD;  Location: [place];  Service: Cardiovascular;  Laterality: N/A;   CHOLECYSTECTOMY N/A 02/07/2016  Procedure: LAPAROSCOPIC CHOLECYSTECTOMY WITH  INTRAOPERATIVE CHOLANGIOGRAM;  Surgeon: Greer Pickerel, MD;  Location: [facility];  Service: General;  Laterality: N/A;   COLONOSCOPY WITH PROPOFOL N/A 04/28/2019   Procedure: COLONOSCOPY WITH PROPOFOL;  Surgeon: Ronnette Juniper, MD;  Location: [place];  Service: Gastroenterology;  Laterality: N/A;   CORONARY ANGIOPLASTY  12/05/01   ESOPHAGOGASTRODUODENOSCOPY (EGD) WITH PROPOFOL N/A 04/27/2019   Procedure: ESOPHAGOGASTRODUODENOSCOPY (EGD) WITH PROPOFOL;  Surgeon: Ronnette Juniper, MD;  Location: [facility];  Service: Gastroenterology;  Laterality: N/A;   GIVENS CAPSULE STUDY N/A 06/29/2019   Procedure: GIVENS CAPSULE STUDY;  Surgeon: Wilford Corner, MD;  Location: [place];  Service: Endoscopy;  Laterality: N/A;   LEFT HEART CATH AND CORONARY ANGIOGRAPHY N/A 12/28/2016   Procedure: Left Heart Cath and Coronary Angiography;  Surgeon: Larey Dresser, MD;  Location: [place] [facility];  Service: Cardiovascular;  Laterality: N/A;   LEFT HEART CATHETERIZATION WITH CORONARY ANGIOGRAM N/A 12/19/2011   Procedure: LEFT HEART CATHETERIZATION WITH CORONARY ANGIOGRAM;  Surgeon: Larey Dresser, MD;  Location: [facility] CATH LAB;  Service: Cardiovascular;  Laterality: N/A;   OSTEOCHONDROMA EXCISION Left 1972   "took bone tumor off my shoulder"   POLYPECTOMY  04/28/2019   Procedure: POLYPECTOMY;  Surgeon: Ronnette Juniper, MD;  Location: [facility];  Service: Gastroenterology;;   PRESSURE SENSOR/CARDIOMEMS N/A 02/01/2020   Procedure: PRESSURE SENSOR/CARDIOMEMS;  Surgeon: Larey Dresser, MD;  Location: [place] [facility];  Service: Cardiovascular;  Laterality: N/A;   RIGHT HEART CATH N/A 02/01/2020   Procedure: RIGHT HEART CATH;  Surgeon: Larey Dresser, MD;  Location: [place] [facility];  Service: Cardiovascular;  Laterality: N/A;   RIGHT/LEFT HEART CATH AND CORONARY ANGIOGRAPHY N/A 07/01/2019   Procedure: RIGHT/LEFT HEART CATH AND CORONARY ANGIOGRAPHY;  Surgeon: Larey Dresser, MD;  Location: [place] [facility];  Service: Cardiovascular;  Laterality: N/A;    Social History   Socioeconomic History   Marital status: Widowed    Spouse name: Not on file   Number of children: 2   Years of education: 14   Highest education level: Some college, no degree  Occupational History    Employer: UNEMPLOYED  Tobacco Use   Smoking status: Former    Packs/day: 4.00    Years: 18.00    Pack years: 72.00    Types: Cigarettes    Quit date: 08/14/1983    Years since quitting: 37.5   Smokeless tobacco: Never  Vaping Use   Vaping Use: Never used  Substance and Sexual Activity   Alcohol use: Yes    Alcohol/week: 16.0 standard drinks    Types: 6 Cans of  beer, 10 Shots of liquor per week    Comment: occasionally   Drug use: Yes    Types: Marijuana    Comment: twice a week   Sexual activity: Never  Other Topics Concern   Not on file  Social History Narrative   Not on file   Social Determinants of Health   Financial Resource Strain: Low Risk    Difficulty of Paying Living Expenses: Not hard at all  Food Insecurity: No Food Insecurity   Worried About Charity fundraiser in the Last Year: Never true   Ran Out of Food in the Last Year: Never true  Transportation Needs: No Transportation Needs   Lack of Transportation (Medical): No   Lack of Transportation (Non-Medical): No  Physical Activity: Inactive   Days of Exercise per Week: 0 days   Minutes of Exercise per  Session: 0 min  Stress: No Stress Concern Present   Feeling of Stress : Not at all  Social Connections: Moderately Isolated   Frequency of Communication with Friends and Family: More than three times a week   Frequency of Social Gatherings with Friends and Family: More than three times a week   Attends Religious Services: More than 4 times per year   Active Member of [employer] or Organizations: No   Attends Archivist Meetings: Never   Marital Status: Widowed  Human resources officer Violence: Not At Risk   Fear of Current or Ex-Partner: No   Emotionally Abused: No   Physically Abused: No   Sexually Abused: No    Current Outpatient Medications on File Prior to Visit  Medication Sig Dispense Refill   ACCU-CHEK SOFTCLIX LANCETS lancets Use as instructed for three times daily testing of blood glucose 100 each 12   acetaminophen (TYLENOL) 500 MG tablet Take 1,000 mg by mouth every 6 (six) hours as needed (pain).     albuterol (VENTOLIN HFA) 108 (90 Base) MCG/ACT inhaler Inhale 1-2 puffs into the lungs every 6 (six) hours as needed for wheezing or shortness of breath. 8 g 0   amLODipine (NORVASC) 5 MG tablet Take 1 tablet by mouth once daily 90 tablet 0   apixaban (ELIQUIS)  5 MG TABS tablet Take 1 tablet (5 mg total) by mouth 2 (two) times daily. 60 tablet 11   atorvastatin (LIPITOR) 80 MG tablet Take 1 tablet (80 mg total) by mouth daily. 90 tablet 3   blood glucose meter kit and supplies KIT Dispense based on patient and insurance preference. Use up to four times daily as directed. (FOR ICD-9 250.00, 250.01). 1 each 0   Blood Glucose Monitoring Suppl (ACCU-CHEK AVIVA PLUS) w/Device KIT Use as directed for 3 times daily testing of blood glucose 1 kit 0   carvedilol (COREG) 25 MG tablet TAKE 1 TABLET BY MOUTH TWICE DAILY WITH A MEAL 180 tablet 3   glucose blood (ACCU-CHEK AVIVA PLUS) test strip Use as instructed for 3 times daily testing of blood glucose 100 each 12   guaiFENesin (MUCINEX PO) Take 1 tablet by mouth 2 (two) times daily as needed (cough/congestion).      hydrALAZINE (APRESOLINE) 50 MG tablet TAKE 1 & 1/2 (ONE & ONE-HALF) TABLETS BY MOUTH TWICE DAILY. MUST MAKE FOLLOW UP APPT FOR FURTHER REFILLS 90 tablet 5   isosorbide mononitrate (IMDUR) 120 MG 24 hr tablet Take 1 tablet by mouth once daily 30 tablet 6   JARDIANCE 10 MG TABS tablet TAKE 1 TABLET BY MOUTH ONCE DAILY BEFORE BREAKFAST 30 tablet 11   Lancet Devices (ACCU-CHEK SOFTCLIX) lancets Use as instructed for 3 times daily testing of blood glucose 1 each 0   nitroGLYCERIN (NITROSTAT) 0.4 MG SL tablet Place 1 tablet (0.4 mg total) under the tongue every 5 (five) minutes as needed for chest pain (x 3 tabs daily). 60 tablet 1   potassium chloride SA (KLOR-CON) 20 MEQ tablet Take 20 mEq by mouth 2 (two) times daily.     sotalol (BETAPACE) 120 MG tablet Take 1 tablet (120 mg total) by mouth 2 (two) times daily. 180 tablet 3   torsemide (DEMADEX) 20 MG tablet Take 3 tablets (60 mg total) by mouth 2 (two) times daily. MUST MAKE FOLLOW UP APPOINTMENT FOR FURTHER REFILLS 200 tablet 0   TRUEPLUS LANCETS 28G MISC 1 each by Does not apply route 3 (three) times daily. 100 each 12  No current facility-administered  medications on file prior to visit.    Allergies  Allergen Reactions   Shrimp [Shellfish Allergy] Nausea And Vomiting   Ace Inhibitors Cough   Other Hives and Other (See Comments)    Patient reports developing hives after receiving "some antibiotic given in 1980''s at [facility]". He does not know which antibiotic.    Family History  Problem Relation Age of Onset   Diabetes Mother    Hypertension Mother    Heart attack Neg Hx    Stroke Neg Hx     BP 118/76 (BP Location: Left Arm, Patient Position: Sitting, Cuff Size: Normal)   Pulse 75   Ht 6' (1.829 m)   Wt 205 lb (93 kg)   SpO2 98%   BMI 27.80 kg/m     Review of Systems denies weight loss, sob, n/v, memory loss, and depression.      Objective:   Physical Exam Pulses: dorsalis pedis intact bilat.   MSK: no deformity of the feet CV: no leg edema Skin:  no ulcer on the feet.  normal color and temp on the feet. Neuro: sensation is intact to touch on the feet  Lab Results  Component Value Date   HGBA1C 9.0 (A) 02/27/2021   Lab Results  Component Value Date   CREATININE 1.74 (H) 12/12/2020   BUN 25 (H) 12/12/2020   NA 138 12/12/2020   K 3.5 12/12/2020   CL 100 12/12/2020   CO2 26 12/12/2020   I have reviewed outside records, and summarized: Pt was noted to have elevated A1c, and referred here.  He was seen in hosp for CAD several times.  He had severely elev A1c, but not DKA     Assessment & Plan:  Insulin-requiring type 2 DM: uncontrolled.   Patient Instructions  good diet and exercise significantly improve the control of your diabetes.  please let me know if you wish to be referred to a dietician.  high blood sugar is very risky to your health.  you should see an eye doctor and dentist every year.  It is very important to get all recommended vaccinations.  Controlling your blood pressure and cholesterol drastically reduces the damage diabetes does to your body.  Those who smoke should quit.   Please discuss these with your doctor.  check your blood sugar twice a day.  vary the time of day when you check, between before the 3 meals, and at bedtime.  also check if you have symptoms of your blood sugar being too high or too low.  please keep a record of the readings and bring it to your next appointment here (or you can bring the meter itself).  You can write it on any piece of paper.  please call us sooner if your blood sugar goes below 70, or if most of your readings are over 200. I have sent a prescription to your pharmacy, for the continuous glucose monitor.  Please call if you need help with this. We will need to take this complex situation in stages.  Please half the metformin, and increase the metformin to 40 units each morning. Try putting the insulin pen on your kitchen table, to help you remember it each morning. Please come back for a follow-up appointment in 2 months.

## 2021-02-27 NOTE — Patient Instructions (Addendum)
good diet and exercise significantly improve the control of your diabetes.  please let me know if you wish to be referred to a dietician.  high blood sugar is very risky to your health.  you should see an eye doctor and dentist every year.  It is very important to get all recommended vaccinations.  Controlling your blood pressure and cholesterol drastically reduces the damage diabetes does to your body.  Those who smoke should quit.  Please discuss these with your doctor.  check your blood sugar twice a day.  vary the time of day when you check, between before the 3 meals, and at bedtime.  also check if you have symptoms of your blood sugar being too high or too low.  please keep a record of the readings and bring it to your next appointment here (or you can bring the meter itself).  You can write it on any piece of paper.  please call us sooner if your blood sugar goes below 70, or if most of your readings are over 200. I have sent a prescription to your pharmacy, for the continuous glucose monitor.  Please call if you need help with this. We will need to take this complex situation in stages.  Please half the metformin, and increase the metformin to 40 units each morning. Try putting the insulin pen on your kitchen table, to help you remember it each morning. Please come back for a follow-up appointment in 2 months.

## 2021-02-27 NOTE — Telephone Encounter (Signed)
[facility] called to get Metformin - Instruction Clarification

## 2021-02-28 ENCOUNTER — Telehealth: Payer: Self-pay | Admitting: Pharmacy Technician

## 2021-02-28 MED ORDER — FREESTYLE LIBRE 2 SENSOR MISC: 1.0000 | 3 refills | Status: DC

## 2021-02-28 MED ORDER — FREESTYLE LIBRE 2 READER DEVI: 1.0000 | Freq: Once | 1 refills | Status: AC

## 2021-02-28 NOTE — Telephone Encounter (Signed)
-----   Message from Orpah Melter, CPhT sent at 02/28/2021  3:45 PM EDT ----- Regarding: [facility] Hello Dr. Loanne Drilling,  This patient's Freestyle Libre products must be processed through his medicare part B.  The plan is asking for them to go through one of the following:  Please call [place] (DME) vendors [employer] at [phone], [employer] at [phone] or [employer] at [phone] for assistance obtaining this device.  Thank you,  [name]. [name], CPhT Patient Advocate [facility] Phone: [phone] Fax:  [phone]

## 2021-02-28 NOTE — Telephone Encounter (Signed)
[facility] Patient Advocate Encounter  Must be processed through patient's Part B Medicare.  Please call [employer] Equipment (DME) vendors [employer] at [phone], [employer] at [phone] or [employer] at [phone] for assistance obtaining this device.  Provider notified.    [facility] will continue to follow.   [name]. [name], CPhT Patient Advocate [facility] Phone: [phone] Fax:  [phone]

## 2021-02-28 NOTE — Telephone Encounter (Signed)
Patient Advocate Encounter   Received notification from [employer] that prior authorization for FREESTYLE LIBRE 2 SENSOR is required.   PA submitted on 02/28/2021 Key [id number] Status is pending    [facility] will continue to follow.   [name]. [name], CPhT Patient Advocate [facility] Phone: [phone] Fax:  [phone]

## 2021-02-28 NOTE — Telephone Encounter (Signed)
Ok, I sent to [name]

## 2021-03-18 ENCOUNTER — Other Ambulatory Visit (HOSPITAL_COMMUNITY): Payer: Self-pay | Admitting: Internal Medicine

## 2021-03-20 NOTE — Progress Notes (Addendum)
Patient ID: [name] [name], male   DOB: 1956/03/29, 65 y.o.   MRN: [id number] PCP: [name], MD Cardiology: Dr. Aundra Dubin  65 y.o. with history of HTN, diabetes, paroxysmal atrial fibrillation, and CAD presents for cardiology followup.  He was admitted in 5/13 with chest pain with exertion.  LHC was done showing global HK with EF 45% and diffuse, severe distal and branch vessel disease.  There was no interventional option, but it was suspect that this disease could be causing his anginal-type pain.  Echo at that time was read as showing EF 60-65%.  He was admitted in 5/14 with hypertensive urgency and chest pain.  He ruled out for MI and BP was controlled.  His EF was 50-55% by echo.  ETT-Sestamibi done as outpatient showed small fixed inferior defect with no ischemia but EF was 31%.  He was quite hypertensive during the study.  Cardiac MRI done to confirm EF in 5/14 showed EF 44%.  He had an episode of atrial fibrillation/RVR in 3/15 but went back into NSR.  Echo in 2/16 showed EF 55-60% with moderate diastolic dysfunction.   He had a Cardiolite in 9/16 showing inferior ischemia.  He was having exertional dyspnea with less activity than normal.  There was concern for worsened CAD and he was sent for [facility] in 9/16. By RHC, filling pressures were not significantly elevated. He had a severe distal RCA stenosis that was treated with DES x 2.  Echo showed EF 55-60%.    In 10/16, he was admitted with acute cholecystitis.  He received a cholecystostomy tube.  Cholecystectomy done in 6/17.   Atrial fibrillation in 1/17, cardioverted to NSR.   Recurrent concerning chest pain in 3/17.  Repeat LHC showed diffuse distal and branch vessel disease consistent with poorly-controlled DM, but patent RCA stents.    He was admitted with atrial fibrillation in 12/17.  Started on sotalol and converted to NSR.    He had recurrent chest pain in 5/18 with abnormal Cardiolite.  Cath was done again in 5/18,  showing diffuse CAD with no good interventional options.   Admitted 11/20 with increased dyspnea and exertional chest pain. Hgb went down to 8.1. GI consulted.  Had [place] that showed severe distal and branch vessel disease but no interventional targets and not good anatomy for CABG. Continued on medical management. Capsule endoscopy without recent or active bleeding, no AVMs. He went into atrial flutter during his hospitalization. He went back into NSR, however, after discharge.  +Snoring and daytime sleepiness.   He has had episodes of syncope associated with vigorous coughing.   He was in the ER with atrial flutter and dyspnea in 4/21, back in NSR on sotalol.    Today he returns for HF follow up. Some SOB walking on flat surface, cannot walk uphill. 1 episode of dizziness a few days ago at the grocery store.  Feels like his belly is more swollen. Denies CP or PND/Orthopnea. Appetite ok. No fever or chills. Weight at home 208-212 pounds. Drinking a lot of beer lately (3-4 beers/day). He has not been taking KCl supplement, says they are too big to swallow.  ECG (personally reviewed): NSR, 1st degree AVB, inferolateral ST-T changes. QTc 480 msec.   Cardiomems: PADP 28 mmHg (goal 24) as of 03/13/21.  Labs (5/13): K 3.6, creatinine 0.93, LDL 85, HDL 57 Labs (7/13): K 3.9, creatinine 1.1, LDL 57, HDL 51  Labs (5/14): K 4, creatinine 1.25 Labs (8/14): K 3.7, creatinine 0.9,  LDL 103, HDL 42 Labs (8/15): K 3.5 creatinine 1.0, BNP 75 Labs (2/16): K 3.8, creatinine 1.1 Labs (9/16): K 3.5, creatinine 1.03, BNP 59, HDL 40, LDL 111, TGs 333 Labs (11/16): K 3.6, creaitnine 1.07, LFTs normal, LDL 44 Labs (6/17): K 3.4, creatinine 1.52 => 1.13 Labs (1/18): K 3.9, creatinine 1.25 Labs (6/18): K 3.8, creatinine 1.21, HCT 39.7 Labs (8/18): LDL 39, HDL 43 Labs (12/18): 4.4, creatinine 1.25, LFTs normal Labs (8/19): K 3.6, creatinine 1.13, LDL 68, HDL 49  Labs (11/20): K 3.5, creatinine 1.33, LDL 25 Labs  (4/21): K 4.0, SCr 1.14, BNP 266  Labs (6/21): K 3.7, creatinine 1.25 Labs (12/21): LDL 25 Labs (1/22): BNP 132 Labs (2/22): K 3.4, creatinine 1.44 Labs (5/22): K 3.5, creatinine 1.74  PMH: 1. DM2 2. Gout 3. HTN: Cough with ACEI.  4. Cardiomyopathy: Suspect mixed ischemic/nonischemic (?ETOH-related).  EF 35% in 2008.  Echo in 5/13 showed EF 60-65% with moderate LVH but EF was 45% on LV-gram in 5/13.  Echo (5/14) with EF 50-55%, mild LVH, inferobasal HK, mild MR.  ETT-Sestamibi in 5/14, however, showed EF 31%.  Cardiac MRI (6/14) with EF 44%, mild global hypokinesis, subepicardial delayed enhancement in a nonspecific RV insertion site pattern.  Echo (2/16) with EF 55-60%, moderate diastolic dysfunction. RHC (9/16) with mean RA 8, PA 39/20 mean 28, mean PCWP 14, CI 2.2. Echo (9/16) with EF 55-60%, grade II diastolic dysfunction.  - Echo (6/17) with EF 55-60%, moderate LVH, grade II diastolic dysfunction, mild AI, mild MR.  - RHC (11/20): mean RA 4, PA 46/14 mean 26, mean PCWP 13, CI 3.23 - Echo (9/20): EF 60-65%, moderate LVH, normal RV size and systolic function, mild-moderate MR, mild AI.  - Echo (5/21): EF 55%, RV normal - Has Cardiomems.  5. CAD: LHC in 2010 with mild nonobstructive disease.  LHC (5/13) with diffuse distal and branch vessel disease, mild global hypokinesis and EF 45%.  ETT-Sestamibi (5/14): EF 31%, small fixed inferior defect with no ischemia.  Lexiscan Cardiolite (9/16) with EF 26%, moderate inferior defect that was partially reversible, suggesting ischemia => High risk study. LHC (9/16) with 40-50% mLAD, diffuse up to 50% distal LAD, 90% ostium of branch off ramus, 95% dRCA => DES to RCA x 2, EF 55-60%.   - LHC (3/17) with diffuse branch and distal vessel disease c/w poorly controlled DM, RCA stents patent.  - LHC (5/18): Diffuse CAD with no good interventional option. 60% mRCA, 90% dLAD, 50% serial mid ramus stenoses, ramus branches with ostial 80-90% stenoses. - LHC  (11/20): Severe distal and branch vessel disease but no interventional targets and not good anatomy for CABG  6. H/o chronic HBV 7. Osteochondroma left shoulder.  8. Hyperlipidemia 9. Paroxysmal atrial fibrillation: Coumadin.  Developed cough and increased ESR with amiodarone.  DCCV in 1/17.  Recurrent atrial fibrillation in 12/17, sotalol started.  10. Atrial flutter: had ablations in 2007 and 2008.  - Zio patch in 4/21 with 20% atrial flutter.  11. OSA on CPAP.  12. Acute cholecystitis (10/16): Cholecystostomy tube placed.  Cholecystectomy 6/17.  13. CKD 14. Anemia - Suspected GI bleed but EGD/c-scope 9/20 without significant findings.  - Capsule Endoscopy (11/20) without recent or active bleeding, no AVMs.  15. Syncope: Suspected cough syncope.  16. OSA  SH: Married, prior smoker.  Some ETOH, occasionally heavy in past.  Does use occasional marijuana. Out of work Engineer, drilling. 2 daughters in grad school.   FH: CAD  ROS: All systems  reviewed and negative except as per HPI.   Current Outpatient Medications  Medication Sig Dispense Refill   ACCU-CHEK SOFTCLIX LANCETS lancets Use as instructed for three times daily testing of blood glucose 100 each 12   acetaminophen (TYLENOL) 500 MG tablet Take 1,000 mg by mouth every 6 (six) hours as needed (pain).     albuterol (VENTOLIN HFA) 108 (90 Base) MCG/ACT inhaler Inhale 1-2 puffs into the lungs every 6 (six) hours as needed for wheezing or shortness of breath. 8 g 0   amLODipine (NORVASC) 5 MG tablet Take 1 tablet by mouth once daily 90 tablet 1   atorvastatin (LIPITOR) 80 MG tablet Take 1 tablet (80 mg total) by mouth daily. 90 tablet 3   blood glucose meter kit and supplies KIT Dispense based on patient and insurance preference. Use up to four times daily as directed. (FOR ICD-9 250.00, 250.01). 1 each 0   Blood Glucose Monitoring Suppl (ACCU-CHEK AVIVA PLUS) w/Device KIT Use as directed for 3 times daily testing of blood glucose 1 kit 0    carvedilol (COREG) 25 MG tablet TAKE 1 TABLET BY MOUTH TWICE DAILY WITH A MEAL 180 tablet 3   glucose blood (ACCU-CHEK AVIVA PLUS) test strip Use as instructed for 3 times daily testing of blood glucose 100 each 12   guaiFENesin (MUCINEX PO) Take 1 tablet by mouth 2 (two) times daily as needed (cough/congestion).      hydrALAZINE (APRESOLINE) 50 MG tablet TAKE 1 & 1/2 (ONE & ONE-HALF) TABLETS BY MOUTH TWICE DAILY. MUST MAKE FOLLOW UP APPT FOR FURTHER REFILLS 90 tablet 5   insulin glargine (LANTUS SOLOSTAR) 100 UNIT/ML Solostar Pen Inject 40 Units into the skin every morning. And pen needles 1/day 45 mL 3   isosorbide mononitrate (IMDUR) 120 MG 24 hr tablet Take 1 tablet by mouth once daily 30 tablet 6   JARDIANCE 10 MG TABS tablet TAKE 1 TABLET BY MOUTH ONCE DAILY BEFORE BREAKFAST 30 tablet 11   Lancet Devices (ACCU-CHEK SOFTCLIX) lancets Use as instructed for 3 times daily testing of blood glucose 1 each 0   metFORMIN (GLUCOPHAGE) 1000 MG tablet Take 1 tablet (1,000 mg total) by mouth daily with breakfast. TAKE 1 TABLET BY MOUTH TWICE DAILY WITH A MEAL 60 tablet 3   nitroGLYCERIN (NITROSTAT) 0.4 MG SL tablet Place 1 tablet (0.4 mg total) under the tongue every 5 (five) minutes as needed for chest pain (x 3 tabs daily). 60 tablet 1   sotalol (BETAPACE) 120 MG tablet Take 1 tablet by mouth twice daily 180 tablet 0   torsemide (DEMADEX) 20 MG tablet Take 3 tablets (60 mg total) by mouth 2 (two) times daily. MUST MAKE FOLLOW UP APPOINTMENT FOR FURTHER REFILLS 200 tablet 0   TRUEPLUS LANCETS 28G MISC 1 each by Does not apply route 3 (three) times daily. 100 each 12   Continuous Blood Gluc Sensor (FREESTYLE LIBRE 2 SENSOR) MISC 1 Device by Does not apply route every 14 (fourteen) days. (Patient not taking: Reported on 03/21/2021) 6 each 3   No current facility-administered medications for this encounter.   BP 120/70   Pulse 75   Wt 96.5 kg (212 lb 12.8 oz)   SpO2 94%   BMI 28.86 kg/m    Wt Readings  from Last 3 Encounters:  03/21/21 96.5 kg (212 lb 12.8 oz)  02/27/21 93 kg (205 lb)  12/12/20 91.7 kg (202 lb 3.2 oz)   PHYSICAL EXAM: General:  NAD. No resp difficulty HEENT:  Normal Neck: Supple. JVP 7-8. Carotids 2+ bilat; no bruits. No lymphadenopathy or thryomegaly appreciated. Cor: PMI nondisplaced. Regular rate & rhythm. No rubs, gallops or murmurs. Lungs: Clear Abdomen: nontender, +distended. No hepatosplenomegaly. No bruits or masses. Good bowel sounds. Extremities: No cyanosis, clubbing, rash, trace LE edema Neuro: Alert & oriented x 3, cranial nerves grossly intact. Moves all 4 extremities w/o difficulty. Affect pleasant.  Assessment/Plan: 1. Chronic diastolic CHF: EF 29% on cardiac MRI in 6/14 but EF improved back to 55-60% on 2/16 echo.  However, EF down to 26% on Cardiolite in 9/16.  Echo in 9/20 showed EF 60-65%, moderate LVH, normal RV, mild-moderate MR.  Echo in 5/21 with EF 55%, normal RV.  He has a Cardiomems device but has not been sending readings in regularly, last reading showed PADP 28 (goal 24).  He appears mildly volume overloaded on exam, weight is up. NYHA class III symptoms.  - Increase torsemide to 80 mg bid x 3 days, then back to 60 mg bid. He will need to take 40 mEq of KCl x 3 days. (Will change his KCl to 10 mEq strength tablets). - Continue Jardiance 10 mg daily. BMET & BNP today. - Continue carvedilol 25 mg bid. - Continue hydralazine 75 mg bid + Imdur 120 mg daily. - I encouraged him to send Cardiomems daily.   - Discussed cutting back on ETOH. 2. Atrial fibrillation/flutter: Paroxysmal. He has had atrial flutter ablations x 2 remotely.  He is on sotalol.  QTc ok on ECG today.  He tolerates atrial arrhythmias poorly, seems to worsen CHF.  Zio patch in 4/21 with 20% atrial flutter. He saw EP, repeat ablation was not recommended. He is in NSR today with no ectopy, however he had another episode of spontaneous dizziness. Does not appear to happen with position  changes. ? If 2/2 to arrhythmia. Will place Zio 14 day to quantify.  - Continue sotalol, QTc acceptable for now.   - Continue Eliquis 5 mg bid. No bleeding issues.     3. Coronary artery disease: LHC in 9/16 showed diffuse CAD, most significant involving the distal RCA.  Patient had DES x 2 to RCA.  Cath in 3/17 with patent RCA stents and diffuse distal and branch vessel disease concerning for poorly controlled diabetes.  Cath in 5/18 again showed diffuse CAD without good interventional target.  Cath in 11/20 showed severe distal and branch vessel disease but no interventional targets and not good anatomy for CABG.  No chest pain currently.  - No ASA given Eliquis use.  - Continue atorvastatin 80 mg daily, check lipids today.  - Continue Coreg, amlodipine, and Imdur 120 mg daily for angina control.  - Avoid ranolazine with sotalol use.  4. Hyperlipidemia:  On atorvastatin. 5. Hypertension: Controlled on current regimen.   6. OSA: Needs CPAP.  7. CKD: BMET today.   Follow up in 3-4 weeks with APP to reassess volume. Encouraged him to use Cardiomems daily.  [place] FNP 03/21/2021

## 2021-03-21 ENCOUNTER — Ambulatory Visit (HOSPITAL_COMMUNITY)
Admission: RE | Admit: 2021-03-21 | Discharge: 2021-03-21 | Disposition: A | Discharge status: Home / Self Care | Payer: Medicare Other | Source: Ambulatory Visit

## 2021-03-21 ENCOUNTER — Ambulatory Visit (HOSPITAL_COMMUNITY)
Admission: RE | Admit: 2021-03-21 | Discharge: 2021-03-21 | Disposition: A | Discharge status: Home / Self Care | Payer: Medicare Other | Source: Ambulatory Visit | Attending: Family Medicine | Admitting: Family Medicine

## 2021-03-21 ENCOUNTER — Other Ambulatory Visit (HOSPITAL_COMMUNITY): Payer: Self-pay | Admitting: Adult Health

## 2021-03-21 ENCOUNTER — Encounter (HOSPITAL_COMMUNITY): Payer: Self-pay

## 2021-03-21 ENCOUNTER — Other Ambulatory Visit: Payer: Self-pay

## 2021-03-21 ENCOUNTER — Other Ambulatory Visit (HOSPITAL_COMMUNITY): Payer: Self-pay | Admitting: Cardiology

## 2021-03-21 VITALS — BP 120/70 | HR 75 | Wt 212.8 lb

## 2021-03-21 DIAGNOSIS — E1122 Type 2 diabetes mellitus with diabetic chronic kidney disease: Secondary | ICD-10-CM | POA: Insufficient documentation

## 2021-03-21 DIAGNOSIS — R0602 Shortness of breath: Secondary | ICD-10-CM | POA: Diagnosis not present

## 2021-03-21 DIAGNOSIS — I25119 Atherosclerotic heart disease of native coronary artery with unspecified angina pectoris: Secondary | ICD-10-CM | POA: Insufficient documentation

## 2021-03-21 DIAGNOSIS — E785 Hyperlipidemia, unspecified: Secondary | ICD-10-CM | POA: Insufficient documentation

## 2021-03-21 DIAGNOSIS — Z79899 Other long term (current) drug therapy: Secondary | ICD-10-CM | POA: Diagnosis not present

## 2021-03-21 DIAGNOSIS — Z09 Encounter for follow-up examination after completed treatment for conditions other than malignant neoplasm: Secondary | ICD-10-CM | POA: Insufficient documentation

## 2021-03-21 DIAGNOSIS — I5032 Chronic diastolic (congestive) heart failure: Secondary | ICD-10-CM | POA: Diagnosis not present

## 2021-03-21 DIAGNOSIS — I4819 Other persistent atrial fibrillation: Secondary | ICD-10-CM

## 2021-03-21 DIAGNOSIS — I10 Essential (primary) hypertension: Secondary | ICD-10-CM

## 2021-03-21 DIAGNOSIS — I493 Ventricular premature depolarization: Secondary | ICD-10-CM | POA: Insufficient documentation

## 2021-03-21 DIAGNOSIS — Z7984 Long term (current) use of oral hypoglycemic drugs: Secondary | ICD-10-CM | POA: Diagnosis not present

## 2021-03-21 DIAGNOSIS — E7800 Pure hypercholesterolemia, unspecified: Secondary | ICD-10-CM

## 2021-03-21 DIAGNOSIS — I2510 Atherosclerotic heart disease of native coronary artery without angina pectoris: Secondary | ICD-10-CM | POA: Diagnosis not present

## 2021-03-21 DIAGNOSIS — Z87891 Personal history of nicotine dependence: Secondary | ICD-10-CM | POA: Insufficient documentation

## 2021-03-21 DIAGNOSIS — I4892 Unspecified atrial flutter: Secondary | ICD-10-CM | POA: Diagnosis not present

## 2021-03-21 DIAGNOSIS — I480 Paroxysmal atrial fibrillation: Secondary | ICD-10-CM | POA: Insufficient documentation

## 2021-03-21 DIAGNOSIS — N1831 Chronic kidney disease, stage 3a: Secondary | ICD-10-CM

## 2021-03-21 DIAGNOSIS — N189 Chronic kidney disease, unspecified: Secondary | ICD-10-CM | POA: Diagnosis not present

## 2021-03-21 DIAGNOSIS — I130 Hypertensive heart and chronic kidney disease with heart failure and stage 1 through stage 4 chronic kidney disease, or unspecified chronic kidney disease: Secondary | ICD-10-CM | POA: Insufficient documentation

## 2021-03-21 DIAGNOSIS — Z955 Presence of coronary angioplasty implant and graft: Secondary | ICD-10-CM | POA: Insufficient documentation

## 2021-03-21 DIAGNOSIS — Z7901 Long term (current) use of anticoagulants: Secondary | ICD-10-CM | POA: Insufficient documentation

## 2021-03-21 DIAGNOSIS — Z794 Long term (current) use of insulin: Secondary | ICD-10-CM | POA: Diagnosis not present

## 2021-03-21 DIAGNOSIS — G4733 Obstructive sleep apnea (adult) (pediatric): Secondary | ICD-10-CM

## 2021-03-21 LAB — LIPID PANEL
Cholesterol: 156 mg/dL (ref 0–200)
HDL: 54 mg/dL (ref >40)
LDL Cholesterol: 81 mg/dL (ref 0–99)
Total CHOL/HDL Ratio: 2.9 RATIO
Triglycerides: 107 mg/dL (ref <150)
VLDL: 21 mg/dL (ref 0–40)

## 2021-03-21 LAB — BASIC METABOLIC PANEL
Anion gap: 9 (ref 5–15)
BUN: 13 mg/dL (ref 8–23)
CO2: 22 mmol/L (ref 22–32)
Calcium: 9.6 mg/dL (ref 8.9–10.3)
Chloride: 105 mmol/L (ref 98–111)
Creatinine, Ser: 1.23 mg/dL (ref 0.61–1.24)
GFR, Estimated: >60 mL/min (ref >60)
Glucose, Bld: 196 mg/dL — ABNORMAL HIGH (ref 70–99)
Potassium: 3.3 mmol/L — ABNORMAL LOW (ref 3.5–5.1)
Sodium: 136 mmol/L (ref 135–145)

## 2021-03-21 LAB — BRAIN NATRIURETIC PEPTIDE: B Natriuretic Peptide: 172.7 pg/mL — ABNORMAL HIGH (ref 0.0–100.0)

## 2021-03-21 MED ORDER — POTASSIUM CHLORIDE CRYS ER 10 MEQ PO TBCR: 40.0000 meq | EXTENDED_RELEASE_TABLET | Freq: Every day | ORAL | 11 refills | Status: DC

## 2021-03-21 MED ORDER — SOTALOL HCL 120 MG PO TABS: 120.0000 mg | ORAL_TABLET | Freq: Two times a day (BID) | ORAL | 3 refills | Status: DC

## 2021-03-21 MED ORDER — EMPAGLIFLOZIN 10 MG PO TABS: 10.0000 mg | ORAL_TABLET | Freq: Every day | ORAL | 11 refills | Status: DC

## 2021-03-21 NOTE — Patient Instructions (Addendum)
EKG done today.   Labs done today. We will contact you only if your labs are abnormal.  INCREASE Torsemide to '80mg'$  (4 tablets) by mouth 2 times daily for 3 days THEN DECREASE to '60mg'$  (3 tablets) by mouth 2 times daily.   No other medication changes were made. Please continue all current medications as prescribed.  Your provider has recommended that  you wear a Zio Patch for 14 days.  This monitor will record your heart rhythm for our review.  IF you have any symptoms while wearing the monitor please press the button.  If you have any issues with the patch or you notice a red or orange light on it please call the company at [phone].  Once you remove the patch please mail it back to the company as soon as possible so we can get the results.  Your physician recommends that you schedule a follow-up appointment in: 10 days for a lab only appointment and in 4 weeks with our APP Clinic here in our office.  If you have any questions or concerns before your next appointment please send [place] a message through [place] or call our office at [phone].    TO LEAVE A MESSAGE FOR THE NURSE SELECT OPTION 2, PLEASE LEAVE A MESSAGE INCLUDING: YOUR NAME DATE OF BIRTH CALL BACK NUMBER REASON FOR CALL**this is important as we prioritize the call backs  YOU WILL RECEIVE A CALL BACK THE SAME DAY AS LONG AS YOU CALL BEFORE 4:00 PM   Do the following things EVERYDAY: Weigh yourself in the morning before breakfast. Write it down and keep it in a log. Take your medicines as prescribed Eat low salt foods--Limit salt (sodium) to 2000 mg per day.  Stay as active as you can everyday Limit all fluids for the day to less than 2 liters   At the [facility], you and your health needs are our priority. As part of our continuing mission to provide you with exceptional heart care, we have created designated Provider Care Teams. These Care Teams include your primary Cardiologist (physician) and  Advanced Practice Providers (APPs- Physician Assistants and Nurse Practitioners) who all work together to provide you with the care you need, when you need it.   You may see any of the following providers on your designated Care Team at your next follow up: Dr Glori Bickers Dr Haynes Kerns, NP Lyda Jester, [place] Audry Riles, PharmD   Please be sure to bring in all your medications bottles to every appointment.

## 2021-03-22 ENCOUNTER — Other Ambulatory Visit (HOSPITAL_COMMUNITY): Payer: Self-pay | Admitting: Family Medicine

## 2021-03-22 DIAGNOSIS — I493 Ventricular premature depolarization: Secondary | ICD-10-CM

## 2021-03-24 ENCOUNTER — Other Ambulatory Visit: Payer: Self-pay

## 2021-03-24 ENCOUNTER — Ambulatory Visit (INDEPENDENT_AMBULATORY_CARE_PROVIDER_SITE_OTHER): Payer: Medicare Other | Admitting: Family Medicine

## 2021-03-24 ENCOUNTER — Encounter: Payer: Self-pay | Admitting: Family Medicine

## 2021-03-24 VITALS — BP 130/72 | HR 80 | Temp 98.4°F | Wt 212.2 lb

## 2021-03-24 DIAGNOSIS — E1165 Type 2 diabetes mellitus with hyperglycemia: Secondary | ICD-10-CM | POA: Diagnosis not present

## 2021-03-24 DIAGNOSIS — E876 Hypokalemia: Secondary | ICD-10-CM

## 2021-03-24 DIAGNOSIS — I5032 Chronic diastolic (congestive) heart failure: Secondary | ICD-10-CM | POA: Diagnosis not present

## 2021-03-24 NOTE — Progress Notes (Signed)
Subjective:    Patient ID: [name] [name], male    DOB: 1956-06-28, 65 y.o.   MRN: [id number]  Chief Complaint  Patient presents with   function    Concerns about kidney function, was informed that they are functioning at 43cc/m.  Endo decreased metformin Cardio increased fluid pills     HPI Patient was seen today for follow-up on chronic conditions.  Patient concerned about kidney function.  Recently seen by endocrinology, metformin was decreased.  Seen by cardiology, torsemide increased.  Potassium was low at 3.3 on 03/21/2021.  Denies headache, CP, changes in vision, changes in urine, myalgias.  Past Medical History:  Diagnosis Date   Anginal pain ([place])    Anxiety    Arrhythmia    CAD (coronary artery disease), native coronary artery    a. Nonobstructive CAD by cath 2013 - diffuse distal and branch vessel CAD, no severe disease in the major coronaries, LV mild global hypokinesis, EF 45%. b. ETT-Sestamibi 5/14: EF 31%, small fixed inferior defect with no ischemia.   Cholecystitis    Chronic CHF ([place])    a. Mixed ICM/NICM (?EtOH). EF 35% in 2008. Echo 5/13: EF 60-65%, mod LVH, EF 45% on V gram in 12/2011. EF 12/2012: EF 50-55%, mild LVH, inferobasal HK, mild MR. ETT-Ses 5/14 EF 41%. Cardiac MRI 5/14: EF 44%, mild global HK, subepicardial delayed enhancement in nonspecific RV insertion pattern.   COLONIC POLYPS, HX OF 12/30/2006   Gout    H/O atrial flutter 2007   a. Ablations in 2007, 2008.   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     Shortness of breath dyspnea    with excertion   Sleep apnea    "suppose to send mask but they never did" (05/03/2015)   Type II diabetes mellitus (HCC)     Allergies  Allergen Reactions   Shrimp [Shellfish Allergy] Nausea And Vomiting   Ace Inhibitors Cough   Other Hives and Other (See Comments)    Patient reports developing hives after receiving "some antibiotic given in 1980''s at [facility]". He does not know which antibiotic.    ROS General: Denies fever, chills, night sweats, changes in weight, changes in appetite HEENT: Denies headaches, ear pain, changes in vision, rhinorrhea, sore throat CV: Denies CP, palpitations, SOB, orthopnea Pulm: Denies SOB, cough, wheezing GI: Denies abdominal pain, nausea, vomiting, diarrhea, constipation GU: Denies dysuria, hematuria, frequency Msk: Denies muscle cramps, joint pains Neuro: Denies weakness, numbness, tingling Skin: Denies rashes, bruising Psych: Denies depression, anxiety, hallucinations     Objective:    Blood pressure 130/72, pulse 80, temperature 98.4 F (36.9 C), temperature source Oral, weight 212 lb 3.2 oz (96.3 kg), SpO2 97 %.  Gen. Pleasant, well-nourished, in no distress, normal affect   HEENT: [place]/AT, face symmetric, conjunctiva clear, no  scleral icterus, PERRLA, EOMI, nares patent without drainage Lungs: no accessory muscle use, CTAB, no wheezes or rales Cardiovascular: RRR, no m/r/g, no peripheral edema Musculoskeletal: No deformities, no cyanosis or clubbing, normal tone Neuro:  A&Ox3, CN II-XII intact, normal gait Skin:  Warm, no lesions/ rash   Wt Readings from Last 3 Encounters:  03/24/21 212 lb 3.2 oz (96.3 kg)  03/21/21 212 lb 12.8 oz (96.5 kg)  02/27/21 205 lb (93 kg)    Lab Results  Component Value Date   WBC 5.9  12/12/2020   HGB 15.0 12/12/2020   HCT 47.3 12/12/2020   PLT 163.0 12/12/2020   GLUCOSE 196 (H) 03/21/2021   CHOL 156 03/21/2021   TRIG 107 03/21/2021   HDL 54 03/21/2021   LDLDIRECT 111.0 04/22/2015   LDLCALC 81 03/21/2021   ALT 17 12/12/2020   AST 16 12/12/2020   NA 136 03/21/2021   K 3.3 (L) 03/21/2021   CL 105 03/21/2021   CREATININE 1.23 03/21/2021   BUN 13 03/21/2021   CO2 22 03/21/2021   TSH 0.86 12/12/2020   PSA 0.91 03/30/2010   INR 1.2 04/24/2019   HGBA1C 9.0 (A) 02/27/2021   MICROALBUR 1.7 07/20/2020    Assessment/Plan:  Uncontrolled type 2 diabetes mellitus with hyperglycemia (HCC) -Hemoglobin A1c 9.0% on 02/27/2021 -Discussed the importance of lifestyle modifications -Continue current medications -Foot exam up-to-date done 02/27/2021 -Eye exam up-to-date negative for retinopathy done 10/21/2020 -On statin.  Allergy to ACE-I. -Continue follow-up with endocrinology  Chronic diastolic CHF (congestive heart failure) (HCC) -Stable -Continue lifestyle modifications -Continue current medications -Continue follow-up with cardiology  Hypokalemia -Potassium 3.3 on 03/21/2021 -Recheck -K-Dur if needed. -Advised kidney function stable.  F/u in 1 month  Grier Mitts, MD

## 2021-03-31 ENCOUNTER — Ambulatory Visit (HOSPITAL_COMMUNITY)
Admission: RE | Admit: 2021-03-31 | Discharge: 2021-03-31 | Disposition: A | Discharge status: Home / Self Care | Payer: Medicare Other | Source: Ambulatory Visit | Attending: Internal Medicine | Admitting: Internal Medicine

## 2021-03-31 ENCOUNTER — Other Ambulatory Visit: Payer: Self-pay

## 2021-03-31 DIAGNOSIS — I5032 Chronic diastolic (congestive) heart failure: Secondary | ICD-10-CM

## 2021-03-31 LAB — BASIC METABOLIC PANEL
Anion gap: 6 (ref 5–15)
BUN: 19 mg/dL (ref 8–23)
CO2: 27 mmol/L (ref 22–32)
Calcium: 9 mg/dL (ref 8.9–10.3)
Chloride: 102 mmol/L (ref 98–111)
Creatinine, Ser: 1.51 mg/dL — ABNORMAL HIGH (ref 0.61–1.24)
GFR, Estimated: 51 mL/min — ABNORMAL LOW (ref >60)
Glucose, Bld: 261 mg/dL — ABNORMAL HIGH (ref 70–99)
Potassium: 4 mmol/L (ref 3.5–5.1)
Sodium: 135 mmol/L (ref 135–145)

## 2021-04-10 ENCOUNTER — Telehealth (HOSPITAL_COMMUNITY): Payer: Self-pay | Admitting: Adult Health

## 2021-04-10 NOTE — Telephone Encounter (Signed)
Attempted to leave message but mailbox is full.   Cardiomems Goal 24  Todays reading elevated at  29   Needs to take an extra 20 mg torsemide for the next 2 days.   Mikka Kissner NP-C  3:01 PM

## 2021-04-12 ENCOUNTER — Other Ambulatory Visit: Payer: Medicare Other

## 2021-04-12 ENCOUNTER — Encounter: Payer: Self-pay | Admitting: Family Medicine

## 2021-04-12 ENCOUNTER — Ambulatory Visit (INDEPENDENT_AMBULATORY_CARE_PROVIDER_SITE_OTHER): Payer: Medicare Other | Admitting: Family Medicine

## 2021-04-12 ENCOUNTER — Other Ambulatory Visit: Payer: Self-pay

## 2021-04-12 VITALS — BP 138/88 | HR 77 | Temp 98.8°F | Resp 16 | Wt 209.8 lb

## 2021-04-12 DIAGNOSIS — N1831 Chronic kidney disease, stage 3a: Secondary | ICD-10-CM

## 2021-04-12 DIAGNOSIS — R079 Chest pain, unspecified: Secondary | ICD-10-CM

## 2021-04-12 DIAGNOSIS — M25531 Pain in right wrist: Secondary | ICD-10-CM | POA: Diagnosis not present

## 2021-04-12 DIAGNOSIS — R42 Dizziness and giddiness: Secondary | ICD-10-CM | POA: Diagnosis not present

## 2021-04-12 DIAGNOSIS — M109 Gout, unspecified: Secondary | ICD-10-CM | POA: Diagnosis not present

## 2021-04-12 DIAGNOSIS — I5032 Chronic diastolic (congestive) heart failure: Secondary | ICD-10-CM | POA: Diagnosis not present

## 2021-04-12 DIAGNOSIS — N179 Acute kidney failure, unspecified: Secondary | ICD-10-CM

## 2021-04-12 LAB — BASIC METABOLIC PANEL
BUN: 14 mg/dL (ref 6–23)
CO2: 24 mEq/L (ref 19–32)
Calcium: 9.4 mg/dL (ref 8.4–10.5)
Chloride: 101 mEq/L (ref 96–112)
Creatinine, Ser: 1.39 mg/dL (ref 0.40–1.50)
GFR: 52.64 mL/min — ABNORMAL LOW (ref >60.00)
Glucose, Bld: 121 mg/dL — ABNORMAL HIGH (ref 70–99)
Potassium: 3.4 mEq/L — ABNORMAL LOW (ref 3.5–5.1)
Sodium: 137 mEq/L (ref 135–145)

## 2021-04-12 LAB — CBC WITH DIFFERENTIAL/PLATELET
Basophils Absolute: 0 10*3/uL (ref 0.0–0.1)
Basophils Relative: 0.7 % (ref 0.0–3.0)
Eosinophils Absolute: 0.3 10*3/uL (ref 0.0–0.7)
Eosinophils Relative: 4.8 % (ref 0.0–5.0)
HCT: 49.4 % (ref 39.0–52.0)
Hemoglobin: 15.7 g/dL (ref 13.0–17.0)
Lymphocytes Relative: 26.5 % (ref 12.0–46.0)
Lymphs Abs: 1.8 10*3/uL (ref 0.7–4.0)
MCHC: 31.8 g/dL (ref 30.0–36.0)
MCV: 77 fl — ABNORMAL LOW (ref 78.0–100.0)
Monocytes Absolute: 1 10*3/uL (ref 0.1–1.0)
Monocytes Relative: 14.7 % — ABNORMAL HIGH (ref 3.0–12.0)
Neutro Abs: 3.6 10*3/uL (ref 1.4–7.7)
Neutrophils Relative %: 53.3 % (ref 43.0–77.0)
Platelets: 166 10*3/uL (ref 150.0–400.0)
RBC: 6.41 Mil/uL — ABNORMAL HIGH (ref 4.22–5.81)
RDW: 19.8 % — ABNORMAL HIGH (ref 11.5–15.5)
WBC: 6.7 10*3/uL (ref 4.0–10.5)

## 2021-04-12 LAB — URIC ACID: Uric Acid, Serum: 9.1 mg/dL — ABNORMAL HIGH (ref 4.0–7.8)

## 2021-04-12 LAB — TROPONIN I (HIGH SENSITIVITY): High Sens Troponin I: 16 ng/L (ref 2–17)

## 2021-04-12 MED ORDER — COLCHICINE 0.6 MG PO TABS
ORAL_TABLET | ORAL | 1 refills | Status: DC
Start: 2021-04-12 — End: 2021-05-01

## 2021-04-12 NOTE — Patient Instructions (Addendum)
Your cardiologist office/heart failure clinic tried to reach you on 04/10/21, but were unable to leave a message.  Your CardioMEMS number was elevated at 29.  Your goal is 24.  They want you to take an additional dose of torsemide 20 mg for 2 days.  Then return to your normal dosing.

## 2021-04-12 NOTE — Progress Notes (Signed)
Subjective:    Patient ID: [name] [name], male    DOB: 04/12/1956, 65 y.o.   MRN: [id number]  Chief Complaint  Patient presents with   Wrist Pain    Pt states that he has been have pain in his wrist for about two days. Pt states that his wrist is also swelling.   Dizziness    Pt states that he had a dizzy spell last night.   Possible gout    HPI Patient was seen today for acute concerns.  Pt endorses L wrist pain x several days.  Swelling in wrist increasing.  Pt had liver recently, seafood-but not shrimp 2/2 allergy, [place] meatloaf, and beer.  Discomfort moving up arm into chest.  Having intermittent "twinge" of chest pain.  Also had a HA with pain in R temple and parietal area.  Pt notes episode of dizziness last night when driving to his daughter's house.  Pt states he felt like the car was "flipping over" but he wasn't moving.  Pt ate fast food 30 min prior to the episode.  Drinking 3 bottles of water per day.  Past Medical History:  Diagnosis Date   Anginal pain ([place])    Anxiety    Arrhythmia    CAD (coronary artery disease), native coronary artery    a. Nonobstructive CAD by cath 2013 - diffuse distal and branch vessel CAD, no severe disease in the major coronaries, LV mild global hypokinesis, EF 45%. b. ETT-Sestamibi 5/14: EF 31%, small fixed inferior defect with no ischemia.   Cholecystitis    Chronic CHF ([place])    a. Mixed ICM/NICM (?EtOH). EF 35% in 2008. Echo 5/13: EF 60-65%, mod LVH, EF 45% on V gram in 12/2011. EF 12/2012: EF 50-55%, mild LVH, inferobasal HK, mild MR. ETT-Ses 5/14 EF 41%. Cardiac MRI 5/14: EF 44%, mild global HK, subepicardial delayed enhancement in nonspecific RV insertion pattern.   COLONIC POLYPS, HX OF 12/30/2006   Gout    H/O atrial flutter 2007   a. Ablations in 2007, 2008.   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     Shortness of breath dyspnea    with excertion   Sleep apnea    "suppose to send mask but they never did" (05/03/2015)   Type II diabetes mellitus (HCC)     Allergies  Allergen Reactions   Shrimp [Shellfish Allergy] Nausea And Vomiting   Ace Inhibitors Cough   Other Hives and Other (See Comments)    Patient reports developing hives after receiving "some antibiotic given in 1980''s at [facility]". He does not know which antibiotic.    ROS General: Denies fever, chills, night sweats, changes in weight, changes in appetite + episode of dizziness HEENT: Denies headaches, ear pain, changes in vision, rhinorrhea, sore throat CV: Denies CP, palpitations,  SOB, orthopnea Pulm: Denies SOB, cough, wheezing GI: Denies abdominal pain, nausea, vomiting, diarrhea, constipation GU: Denies dysuria, hematuria, frequency Msk: Denies muscle cramps, joint pains + left wrist edema and pain Neuro: Denies weakness, numbness, tingling Skin: Denies rashes, bruising Psych: Denies depression, anxiety, hallucinations    Objective:    Blood pressure 138/88, pulse 77, temperature 98.8 F (37.1 C), resp. rate 16, weight 209 lb 12.8 oz (95.2 kg), SpO2 96 %.  Gen. Pleasant, well-nourished, in no distress, normal affect   HEENT: [place]/AT, face symmetric, conjunctiva clear, no scleral icterus, PERRLA, EOMI, nares patent without drainage Lungs: no accessory muscle use, CTAB, no wheezes or rales Cardiovascular: RRR, no m/r/g, no  peripheral edema Musculoskeletal: Edema and erythema of L wrist.  No cyanosis or clubbing, normal tone Neuro:  A&Ox3, CN II-XII intact, normal gait Skin:  Warm, no lesions/ rash  Wt Readings from Last 3 Encounters:  04/12/21 209 lb 12.8 oz (95.2 kg)  03/24/21 212 lb 3.2 oz (96.3 kg)  03/21/21 212 lb 12.8 oz (96.5 kg)    Lab Results  Component Value Date   WBC 5.9 12/12/2020   HGB 15.0 12/12/2020   HCT 47.3 12/12/2020   PLT 163.0 12/12/2020   GLUCOSE 261 (H) 03/31/2021   CHOL 156 03/21/2021   TRIG 107 03/21/2021   HDL 54 03/21/2021   LDLDIRECT 111.0 04/22/2015   LDLCALC 81 03/21/2021   ALT 17 12/12/2020   AST 16 12/12/2020   NA 135 03/31/2021   K 4.0 03/31/2021   CL 102 03/31/2021   CREATININE 1.51 (H) 03/31/2021   BUN 19 03/31/2021   CO2 27 03/31/2021   TSH 0.86 12/12/2020   PSA 0.91 03/30/2010   INR 1.2 04/24/2019   HGBA1C 9.0 (A) 02/27/2021   MICROALBUR 1.7 07/20/2020    Assessment/Plan:  Chronic kidney disease stage IIIa (HCC) -creatinine 1.51 on 03/31/21 - Plan: Basic metabolic panel  Right wrist pain -Discussed possible causes including gout, arthritis, CTS -Discussed supportive care including ice, elevation, compression -Increase p.o. intake of water.  Advised to decrease intake of beer and purine rich foods -Consider prednisone or colchicine based on labs - Plan: CBC with Differential/Platelet, Uric Acid  Chest pain, unspecified type  -EKG from 03/21/2021 with first-degree AV block. -EKG this visit sinus rhythm with first-degree AV block, PR interval 210 msec.  Similar to EKG from 03/21/2021. -Continue follow-up with cardiology -Given precautions -Obtain labs. - Plan: Troponin I, EKG 12-Lead  Dizziness -Resolved -EKG done this visit -Lipid panel normal on [id number]  Chronic diastolic CHF ([place]) -Per chart review cardiology tried to reach patient to advise him to take an extra dose of torsemide for the next 2 days given CardioMEMS elevation of 29.  Goal  24. -Continue other medications  F/u prn in the next 2-4 wks  Update: Acute gout flare, left wrist, unspecified cause -Labs with elevated uric acid level at 9.1.  A prescription for colchicine sent to patient's pharmacy.  Hypokalemia noted at 3.4.  Blood sugar improving.    Patient has potassium tablets at home.  We will have him take an additional tablet for the next 3 days.  Grier Mitts, MD

## 2021-04-19 ENCOUNTER — Encounter (HOSPITAL_COMMUNITY): Payer: Medicare Other

## 2021-04-20 ENCOUNTER — Telehealth: Payer: Self-pay | Admitting: *Deleted

## 2021-04-20 NOTE — Chronic Care Management (AMB) (Signed)
  Chronic Care [name]   Note  04/20/2021 Name: [name] [name] MRN: [id number] DOB: 19-May-1956  [name] [name] is a 65 y.o. year old male who is a primary care patient of Volanda Napoleon Langley Adie, MD. I reached out to [name] [name] by phone today in response to a referral sent by [name] [name] PCP, Dr. Volanda Napoleon      [name] [name] was given information about Chronic Care [name] services today including:  CCM service includes personalized support from designated clinical staff supervised by his physician, including individualized plan of care and coordination with other care providers 24/7 contact phone numbers for assistance for urgent and routine care needs. Service will only be billed when office clinical staff spend 20 minutes or more in a month to coordinate care. Only one practitioner may furnish and bill the service in a calendar month. The patient may stop CCM services at any time (effective at the end of the month) by phone call to the office staff. The patient will be responsible for cost sharing (co-pay) of up to 20% of the service fee (after annual deductible is met).  Patient agreed to services and verbal consent obtained.   Follow up plan: Telephone appointment with care [name] team member scheduled for:04/28/21  [name] [name]  Direct Dial: [phone]

## 2021-04-21 DIAGNOSIS — I493 Ventricular premature depolarization: Secondary | ICD-10-CM | POA: Diagnosis not present

## 2021-04-24 ENCOUNTER — Ambulatory Visit: Payer: Medicare Other | Admitting: Family Medicine

## 2021-04-25 NOTE — Addendum Note (Signed)
Encounter addended by: Micki Riley, RN on: 04/25/2021 10:50 AM  Actions taken: Imaging Exam ended

## 2021-04-26 ENCOUNTER — Encounter (HOSPITAL_COMMUNITY): Payer: Medicare Other

## 2021-04-28 ENCOUNTER — Ambulatory Visit (INDEPENDENT_AMBULATORY_CARE_PROVIDER_SITE_OTHER): Payer: Medicare Other

## 2021-04-28 DIAGNOSIS — I4891 Unspecified atrial fibrillation: Secondary | ICD-10-CM

## 2021-04-28 DIAGNOSIS — E7800 Pure hypercholesterolemia, unspecified: Secondary | ICD-10-CM

## 2021-04-28 DIAGNOSIS — I5032 Chronic diastolic (congestive) heart failure: Secondary | ICD-10-CM

## 2021-04-28 DIAGNOSIS — E1165 Type 2 diabetes mellitus with hyperglycemia: Secondary | ICD-10-CM

## 2021-04-28 DIAGNOSIS — I10 Essential (primary) hypertension: Secondary | ICD-10-CM

## 2021-04-28 DIAGNOSIS — N1831 Chronic kidney disease, stage 3a: Secondary | ICD-10-CM

## 2021-04-28 NOTE — Chronic Care Management (AMB) (Signed)
Chronic Care Management   CCM RN Visit Note  04/28/2021 Name: [name] [name] MRN: [id number] DOB: 11-01-1955  Subjective: [name] [name] is a 65 y.o. year old male who is a primary care patient of [name], MD. The care management team was consulted for assistance with disease management and care coordination needs.    Engaged with patient by telephone for initial visit in response to provider referral for case management and/or care coordination services.   Consent to Services:  The patient was given the following information about Chronic Care Management services today, agreed to services, and gave verbal consent: 1. CCM service includes personalized support from designated clinical staff supervised by the primary care provider, including individualized plan of care and coordination with other care providers 2. 24/7 contact phone numbers for assistance for urgent and routine care needs. 3. Service will only be billed when office clinical staff spend 20 minutes or more in a month to coordinate care. 4. Only one practitioner may furnish and bill the service in a calendar month. 5.The patient may stop CCM services at any time (effective at the end of the month) by phone call to the office staff. 6. The patient will be responsible for cost sharing (co-pay) of up to 20% of the service fee (after annual deductible is met). Patient agreed to services and consent obtained.  Patient agreed to services and verbal consent obtained.   Assessment: Review of patient past medical history, allergies, medications, health status, including review of consultants reports, laboratory and other test data, was performed as part of comprehensive evaluation and provision of chronic care management services.   SDOH (Social Determinants of Health) assessments and interventions performed:  SDOH Interventions    Flowsheet Row Most Recent Value  SDOH Interventions   Food Insecurity Interventions  Intervention Not Indicated  Housing Interventions Other (Comment)  [referred to care guide]  Stress Interventions Intervention Not Indicated  Transportation Interventions Intervention Not Indicated        CCM Care Plan  Allergies  Allergen Reactions   Shrimp [Shellfish Allergy] Nausea And Vomiting   Ace Inhibitors Cough   Other Hives and Other (See Comments)    Patient reports developing hives after receiving "some antibiotic given in 1980''s at [facility]". He does not know which antibiotic.    Outpatient Encounter Medications as of 04/28/2021  Medication Sig   ACCU-CHEK SOFTCLIX LANCETS lancets Use as instructed for three times daily testing of blood glucose   acetaminophen (TYLENOL) 500 MG tablet Take 1,000 mg by mouth every 6 (six) hours as needed (pain).   albuterol (VENTOLIN HFA) 108 (90 Base) MCG/ACT inhaler Inhale 1-2 puffs into the lungs every 6 (six) hours as needed for wheezing or shortness of breath.   amLODipine (NORVASC) 5 MG tablet Take 1 tablet by mouth once daily   atorvastatin (LIPITOR) 80 MG tablet Take 1 tablet (80 mg total) by mouth daily.   blood glucose meter kit and supplies KIT Dispense based on patient and insurance preference. Use up to four times daily as directed. (FOR ICD-9 250.00, 250.01).   Blood Glucose Monitoring Suppl (ACCU-CHEK AVIVA PLUS) w/Device KIT Use as directed for 3 times daily testing of blood glucose   carvedilol (COREG) 25 MG tablet TAKE 1 TABLET BY MOUTH TWICE DAILY WITH A MEAL   colchicine 0.6 MG tablet On day 1 take 2 tablets at first sign of gout flare.  Take 1 tablet 1 hour later.  Then take 1 tablet daily  starting the next day until resolution of gout flare.   Continuous Blood Gluc Sensor (FREESTYLE LIBRE 2 SENSOR) MISC 1 Device by Does not apply route every 14 (fourteen) days. (Patient not taking: No sig reported)   empagliflozin (JARDIANCE) 10 MG TABS tablet Take 1 tablet (10 mg total) by mouth daily before breakfast.    glucose blood (ACCU-CHEK AVIVA PLUS) test strip Use as instructed for 3 times daily testing of blood glucose   guaiFENesin (MUCINEX PO) Take 1 tablet by mouth 2 (two) times daily as needed (cough/congestion).    hydrALAZINE (APRESOLINE) 50 MG tablet TAKE 1 & 1/2 (ONE & ONE-HALF) TABLETS BY MOUTH TWICE DAILY. MUST MAKE FOLLOW UP APPT FOR FURTHER REFILLS   insulin glargine (LANTUS SOLOSTAR) 100 UNIT/ML Solostar Pen Inject 40 Units into the skin every morning. And pen needles 1/day   isosorbide mononitrate (IMDUR) 120 MG 24 hr tablet Take 1 tablet by mouth once daily   Lancet Devices (ACCU-CHEK SOFTCLIX) lancets Use as instructed for 3 times daily testing of blood glucose   metFORMIN (GLUCOPHAGE) 1000 MG tablet Take 1 tablet (1,000 mg total) by mouth daily with breakfast. TAKE 1 TABLET BY MOUTH TWICE DAILY WITH A MEAL   nitroGLYCERIN (NITROSTAT) 0.4 MG SL tablet Place 1 tablet (0.4 mg total) under the tongue every 5 (five) minutes as needed for chest pain (x 3 tabs daily).   potassium chloride (KLOR-CON) 10 MEQ tablet Take 4 tablets (40 mEq total) by mouth daily.   sotalol (BETAPACE) 120 MG tablet Take 1 tablet (120 mg total) by mouth 2 (two) times daily.   torsemide (DEMADEX) 20 MG tablet Take 3 tablets (60 mg total) by mouth 2 (two) times daily. MUST MAKE FOLLOW UP APPOINTMENT FOR FURTHER REFILLS   TRUEPLUS LANCETS 28G MISC 1 each by Does not apply route 3 (three) times daily.   No facility-administered encounter medications on file as of 04/28/2021.    Patient Active Problem List   Diagnosis Date Noted   Syncope 02/09/2020   Generalized weakness 06/27/2019   Acute on chronic congestive heart failure with left ventricular diastolic dysfunction ([place]) 06/26/2019   Anemia due to chronic kidney disease 06/26/2019   CKD (chronic kidney disease), stage III ([place]) 06/26/2019   Type 2 diabetes mellitus without complication ([place]) 93/81/0175   Iron deficiency anemia due to chronic blood loss 06/26/2019    History of atrial fibrillation    History of type 2 diabetes mellitus    Anemia    Chest pain 04/24/2019   Overweight (BMI 25.0-29.9) 02/28/2017   Unstable angina ([place]) 08/03/2016   S/P laparoscopic cholecystectomy 02/07/2016   Coronary artery disease involving native coronary artery with unstable angina pectoris ([place]) 12/29/2015   Diabetes ([place]) 11/12/2015   Chronic anticoagulation 08/22/2015   Atrial flutter with controlled response ([place]) 08/22/2015   RUQ pain    Cholelithiases- drain in place 06/04/2015   Essential hypertension    Chronic diastolic CHF (congestive heart failure) ([place]) 04/24/2015   Excessive daytime sleepiness 11/25/2014   Obstructive sleep apnea 11/25/2014   NICM (nonischemic cardiomyopathy) ([place]) 03/11/2014   Anticoagulation goal of INR 2 to 3 10/19/2013   Eczema 10/19/2013   Low libido 10/16/2013   Persistent atrial fibrillation ([place]) 10/06/2013   Rectal bleeding 12/18/2011   HYPERCHOLESTEROLEMIA 07/11/2010   DENTAL CARIES 06/30/2010   GOUT 05/05/2009   HEPATITIS B, CHRONIC 12/30/2006   OSTEOCHONDROMA 12/30/2006   Mitral valve disorder 12/30/2006   LIVER FUNCTION TESTS, ABNORMAL, HX OF 12/30/2006   COLONIC  POLYPS, HX OF 12/30/2006   COLONOSCOPY, HX OF 05/12/2001    Conditions to be addressed/monitored:Atrial Fibrillation, CHF, CAD, HTN, HLD, DMII, and CKD Stage 3  Care Plan : Cardiovascular disease  (HF,CAD, AF, HTN and HLD)  Updates made by Dimitri Ped, RN since 04/28/2021 12:00 AM     Problem: Lack of long term self management plan for Cardiovascular disease  (HF,CAD, AF, HTN and HLD)   Priority: High     Goal: Symptom Exacerbation Prevented or Minimized   Start Date: 04/28/2021  Expected End Date: 10/11/2021  This Visit's Progress: On track  Priority: High  Note:   Current Barriers:  Knowledge deficits related to basic Cardiovascular disease  (HF,CAD, AF, HTN and HLD) pathophysiology and self care management Unable to independently self  manage Cardiovascular disease  (HF,CAD, AF, HTN and HLD) Does not adhere to provider recommendations re:  Does not adhere to prescribed medication regimen States he does not weigh daily and he has not been sending his CardioMem reading daily.  States his weight has been staying stable from 210-213.  Denies any increase in shortness of breath, swelling or chest pains.  States he still has some dizziness sometimes when he is at the store.  States he does forget to take his PM medications about 2-3 times a week.  States he does not use a pill box but he does have a pill box. States he does eat fast food 2-3 times a week such as [employer] or [employer] when he is keeping his grandson. Nurse Case Manager Clinical Goal(s):  patient will weigh self daily and record patient will verbalize understanding of Heart Failure Action Plan and when to call doctor patient will take all Heart Failure mediations as prescribed Interventions:  Collaboration with [name], MD regarding development and update of comprehensive plan of care as evidenced by provider attestation and co-signature Inter-disciplinary care team collaboration (see longitudinal plan of care) Basic overview and discussion of pathophysiology of Cardiovascular disease  (HF,CAD, AF, HTN and HLD) Provided written and verbal education on low sodium diet Reviewed Heart Failure Action Plan in depth and provided written copy-mailed [place]  calendar with action plan Assessed for scales in home-has scales Discussed importance of daily weight Reviewed role of diuretics in prevention of fluid overload Reived to try to send in his CardioMem dialy Reviewed to try to avoid fast food with higher sodium  Reivewed to try filling his medications in his pill box to see if that will help him remember to take his evening pills Sent AM/PM medication box Self-Care Activities:  Takes Heart Failure Medications as prescribed Weighs daily and record  (notifying MD of 3 lb weight gain over night or 5 lb in a week) Verbalizes understanding of and follows CHF Action Plan Adheres to low sodium diet  Patient Goals:  - Take Heart Failure Medications as prescribed - Weigh daily and record (notify MD with 3 lb weight gain over night or 5 lb in a week) - Follow CHF Action Plan - Adhere to low sodium diet - begin a heart failure diary - develop a rescue plan - eat more whole grains, fruits and vegetables, lean meats and healthy fats - follow rescue plan if symptoms flare-up - know when to call the doctor - track symptoms and what helps feel better or worse - dress right for the weather, hot or cold - pace activity allowing for rest - call office if I gain more than 2 pounds in  one day or 5 pounds in one week - keep legs up while sitting - track weight in diary - use salt in moderation - watch for swelling in feet, ankles and legs every day - weigh myself daily - call for medicine refill 2 or 3 days before it runs out - keep a list of all the medicines I take; vitamins and herbals too - learn to read medicine labels - use a pillbox to sort medicine - use an alarm clock or phone to remind me to take my medicine Follow Up Plan: Telephone follow up appointment with care management team member scheduled for: 05/29/21  at 2 PM The patient has been provided with contact information for the care management team and has been advised to call with any health related questions or concerns.      Care Plan : Diabetes Type 2 (Adult)  Updates made by Dimitri Ped, RN since 04/28/2021 12:00 AM     Problem: Glycemic Management (Diabetes, Type 2)   Priority: Medium     Long-Range Goal: Glycemic Management Optimized   Start Date: 04/28/2021  Expected End Date: 10/11/2021  This Visit's Progress: On track  Priority: Medium  Note:   Objective:  Lab Results  Component Value Date   HGBA1C 9.0 (A) 02/27/2021   Lab Results  Component Value Date    CREATININE 1.39 04/12/2021   CREATININE 1.51 (H) 03/31/2021   CREATININE 1.23 03/21/2021   No results found for: EGFR Current Barriers:  Knowledge Deficits related to basic Diabetes pathophysiology and self care/management Knowledge Deficits related to medications used for management of diabetes Unable to independently self manage Type 2 diabetes Does not adhere to provider recommendations re:  Does not adhere to prescribed medication regimen States he checks his blood sugars every few days with fasting reading of around 145 and if he checks later in the day 175.  Denies any hypoglycemia.  States he sometimes forgets to take his insulin.  Declines pharmacy referral at this time.  States he ties to eat fruit and vegetables every day. States he has a front window that leaks water into his house that needs to be repaired Case Manager Clinical Goal(s):  patient will demonstrate improved adherence to prescribed treatment plan for diabetes self care/management as evidenced by: daily monitoring and recording of CBG  adherence to ADA/ carb modified diet adherence to prescribed medication regimen contacting provider for new or worsened symptoms or questions Interventions:  Collaboration with [name], MD regarding development and update of comprehensive plan of care as evidenced by provider attestation and co-signature Inter-disciplinary care team collaboration (see longitudinal plan of care) Provided education to patient about basic DM disease process Reviewed medications with patient and discussed importance of medication adherence Discussed plans with patient for ongoing care management follow up and provided patient with direct contact information for care management team Provided patient with written educational materials related to hypo and hyperglycemia and importance of correct treatment Reviewed scheduled/upcoming provider appointments including: Dr. Aundra Dubin 05/03/21, Dr. Loanne Drilling  05/05/21 Advised patient, providing education and rationale, to check cbg daily and record, calling provider for findings outside established parameters.   Referral made to pharmacy team for assistance with declines referral to assist with adherence at this time Referral made to community resources care guide team for assistance with resources for home repairs Review of patient status, including review of consultants reports, relevant laboratory and other test results, and medications completed. Self-Care Activities - Self administers oral medications as  prescribed Self administers insulin as prescribed Attends all scheduled provider appointments Checks blood sugars as prescribed and utilize hyper and hypoglycemia protocol as needed Adheres to prescribed ADA/carb modified Patient Goals: - check blood sugar at prescribed times - check blood sugar if I feel it is too high or too low - enter blood sugar readings and medication or insulin into daily log - take the blood sugar log to all doctor visits - take the blood sugar meter to all doctor visits - change to whole grain breads, cereal, pasta - drink 6 to 8 glasses of water each day - eat fish at least once per week - fill half of plate with vegetables - limit fast food meals to no more than 1 per week - manage portion size - prepare main meal at home 3 to 5 days each week - reduce red meat to 2 to 3 times a week - switch to low-fat or skim milk - switch to sugar-free drinks - keep appointment with eye doctor - check feet daily for cuts, sores or redness - keep feet up while sitting - trim toenails straight across - wash and dry feet carefully every day - wear comfortable, cotton socks - wear comfortable, well-fitting shoes Follow Up Plan: Telephone follow up appointment with care management team member scheduled for: 05/29/21 at 2 PM The patient has been provided with contact information for the care management team and has been  advised to call with any health related questions or concerns.       Plan:Telephone follow up appointment with care management team member scheduled for:  05/29/21 and The patient has been provided with contact information for the care management team and has been advised to call with any health related questions or concerns.  Peter Garter RN, Jackquline Denmark, CDE Care Management Coordinator [facility] [phone], Mobile [phone]

## 2021-04-28 NOTE — Patient Instructions (Signed)
Visit Information   PATIENT GOALS:   Goals Addressed             This Visit's Progress    Monitor and Manage My Blood Sugar-Diabetes Type 2       Timeframe:  Long-Range Goal Priority:  Medium Start Date:   04/28/21                          Expected End Date:  10/11/21                     Follow Up Date 05/29/21    - check blood sugar at prescribed times - check blood sugar if I feel it is too high or too low - enter blood sugar readings and medication or insulin into daily log - take the blood sugar log to all doctor visits - take the blood sugar meter to all doctor visits    Why is this important?   Checking your blood sugar at home helps to keep it from getting very high or very low.  Writing the results in a diary or log helps the doctor know how to care for you.  Your blood sugar log should have the time, date and the results.  Also, write down the amount of insulin or other medicine that you take.  Other information, like what you ate, exercise done and how you were feeling, will also be helpful.     Notes:      RNCM:Eat Healthy       Timeframe:  Long-Range Goal Priority:  Medium Start Date:   04/28/21                         Expected End Date:   10/11/21                    Follow Up Date 05/29/21    - change to whole grain breads, cereal, pasta - drink 6 to 8 glasses of water each day - eat fish at least once per week - fill half of plate with vegetables - limit fast food meals to no more than 1 per week - manage portion size - prepare main meal at home 3 to 5 days each week - read food labels for fat, fiber, carbohydrates and portion size - switch to low-fat or skim milk - switch to sugar-free drinks    Why is this important?   When you are ready to manage your nutrition or weight, having a plan and setting goals will help.  Taking small steps to change how you eat and exercise is a good place to start.    Notes:      RNCM:Manage My Medicine        Timeframe:  Long-Range Goal Priority:  Medium Start Date:      04/28/21                       Expected End Date:  10/11/21                     Follow Up Date 05/29/21    - call for medicine refill 2 or 3 days before it runs out - keep a list of all the medicines I take; vitamins and herbals too - learn to read medicine labels - use a pillbox to sort medicine - use an alarm clock  or phone to remind me to take my medicine    Why is this important?   These steps will help you keep on track with your medicines.   Notes:      RNCM:Track and Manage Fluids and Swelling-Heart Failure       Timeframe:  Long-Range Goal Priority:  High Start Date:    04/28/21                         Expected End Date:    04/13/22                   Follow Up Date 05/29/21    - call office if I gain more than 2 pounds in one day or 5 pounds in one week - keep legs up while sitting - track weight in diary - use salt in moderation - watch for swelling in feet, ankles and legs every day - weigh myself daily    Why is this important?   It is important to check your weight daily and watch how much salt and liquids you have.  It will help you to manage your heart failure.    Notes:       Heart Failure Action Plan A heart failure action plan helps you understand what to do when you have symptoms of heart failure. Your action plan is a color-coded plan that lists the symptoms to watch for and indicates what actions to take. If you have symptoms in the red zone, you need medical care right away. If you have symptoms in the yellow zone, you are having problems. If you have symptoms in the green zone, you are doing well. Follow the plan that was created by you and your health care provider. Review your plan each time you visit your health care provider. Red zone These signs and symptoms mean you should get medical help right away: You have trouble breathing when resting. You have a dry cough that is getting  worse. You have swelling or pain in your legs or abdomen that is getting worse. You suddenly gain more than 2-3 lb (0.9-1.4 kg) in 24 hours, or more than 5 lb (2.3 kg) in a week. This amount may be more or less depending on your condition. You have trouble staying awake or you feel confused. You have chest pain. You do not have an appetite. You pass out. You have worsening sadness or depression. If you have any of these symptoms, call your local emergency services (911 in the [place] right away. Do not drive yourself to the hospital. Yellow zone These signs and symptoms mean your condition may be getting worse and you should make some changes: You have trouble breathing when you are active, or you need to sleep with your head raised on extra pillows to help you breathe. You have swelling in your legs or abdomen. You gain 2-3 lb (0.9-1.4 kg) in 24 hours, or 5 lb (2.3 kg) in a week. This amount may be more or less depending on your condition. You get tired easily. You have trouble sleeping. You have a dry cough. If you have any of these symptoms: Contact your health care provider within the next day. Your health care provider may adjust your medicines. Green zone These signs mean you are doing well and can continue what you are doing: You do not have shortness of breath. You have very little swelling or no new swelling. Your weight is  stable (no gain or loss). You have a normal activity level. You do not have chest pain or any other new symptoms. Follow these instructions at home: Take over-the-counter and prescription medicines only as told by your health care provider. Weigh yourself daily. Your target weight is __________ lb (__________ kg). Call your health care provider if you gain more than __________ lb (__________ kg) in 24 hours, or more than __________ lb (__________ kg) in a week. Health care provider name: _____________________________________________________ Health care  provider phone number: _____________________________________________________ Eat a heart-healthy diet. Work with a diet and nutrition specialist (dietitian) to create an eating plan that is best for you. Keep all follow-up visits. This is important. Where to find more information American Heart Association: www.heart.org Summary A heart failure action plan helps you understand what to do when you have symptoms of heart failure. Follow the action plan that was created by you and your health care provider. Get help right away if you have any symptoms in the red zone. This information is not intended to replace advice given to you by your health care provider. Make sure you discuss any questions you have with your health care provider. Document Revised: 03/14/2020 Document Reviewed: 03/14/2020 Elsevier Patient Education  2022 [employer].   Consent to CCM Services: Mr. Stoffers was given information about Chronic Care Management services including:  CCM service includes personalized support from designated clinical staff supervised by his physician, including individualized plan of care and coordination with other care providers 24/7 contact phone numbers for assistance for urgent and routine care needs. Service will only be billed when office clinical staff spend 20 minutes or more in a month to coordinate care. Only one practitioner may furnish and bill the service in a calendar month. The patient may stop CCM services at any time (effective at the end of the month) by phone call to the office staff. The patient will be responsible for cost sharing (co-pay) of up to 20% of the service fee (after annual deductible is met). Type 2 Diabetes Mellitus, Self-Care, Adult When you have type 2 diabetes (type 2 diabetes mellitus), you must make sure your blood sugar (glucose) stays in a healthy range. You can do this with: Nutrition. Exercise. Lifestyle changes. Medicines or insulin, if  needed. Support from your doctors and others. What are the risks? Having type 2 diabetes can raise your risk for other long-term (chronic) health problems. You may get medicines to help prevent these problems. How to stay aware of your blood sugar  Check your blood sugar level every day, as often as told. Have your A1C (hemoglobin A1C) level checked two or more times a year. Have it checked more often if told. Your doctor will set personal treatment goals for you. In general, you should have these blood sugar levels: Before meals: 80-130 mg/dL (4.4-7.2 mmol/L). After meals: below 180 mg/dL (10 mmol/L). A1C: less than 7%. How to manage high and low blood sugar Symptoms of high blood sugar High blood sugar is also called hyperglycemia. Know the symptoms of high blood sugar. These may include: More thirst. Hunger. Feeling very tired. Needing to pee (urinate) more often than normal. Seeing things blurry. Symptoms of low blood sugar Low blood sugar is also called hypoglycemia. This is when blood sugar is at or below 70 mg/dL (3.9 mmol/L). Symptoms may include: Hunger. Feeling worried or nervous (anxious). Feeling sweaty and cold to the touch (clammy). Being dizzy or light-headed. Feeling sleepy. A fast heartbeat. Feeling grouchy (  irritable). Tingling or loss of feeling (numbness) around your mouth, lips, or tongue. Restless sleep. Diabetes medicines can cause low blood sugar. You are more at risk: While you exercise. After exercise. During sleep. When you are sick. When you skip meals or do not eat for a long time. Treating low blood sugar If you think you have low blood sugar, eat or drink something sugary right away. Keep 15 grams of a fast-acting carb (carbohydrate) with you all the time. Make sure your family and friends know how to treat you if you cannot treat yourself. Treating very low blood sugar Severe hypoglycemia is when your blood sugar is at or below 54 mg/dL (3  mmol/L). Severe hypoglycemia is an emergency. Get medical help right away. Call your local emergency services (911 in the [place] Do not wait to see if the symptoms will go away. Do not drive yourself to the hospital. You may need a glucagon shot if you have very low blood sugar and you cannot eat or drink. Have a family member or friend learn how to check your blood sugar and how to give you a glucagon shot. Ask your doctor if you should have a kit for glucagon shots. Follow these instructions at home: Medicines Take prescribed insulin or diabetes medicines as told by your health care provider. Do not run out of insulin or other medicines. Plan ahead. If you use insulin, change the amount you take based on how active you are and what foods you eat. Your doctor will tell you how to do this. Take over-the-counter and prescription medicines only as told by your doctor. Eating and drinking  Eat healthy foods. These include: Low-fat (lean) proteins. Complex carbs, such as whole grains. Fresh fruits and vegetables. Low-fat dairy products. Healthy fats. Meet with a food expert (dietitian) to make an eating plan. Follow instructions from your doctor about what you cannot eat or drink. Drink enough fluid to keep your pee (urine) pale yellow. Keep track of carbs that you eat. Read food labels and learn serving sizes of foods. Follow your sick-day plan when you cannot eat or drink as normal. Make this plan with your doctor so it is ready to use. Activity Exercise as told by your doctor. You may need to: Do stretching and strength exercises two or more times a week. Do 150 minutes or more of exercise each week that makes your heart beat faster and makes you sweat. Spread out your exercise over 3 or more days a week. Do not go more than 2 days in a row without exercise. Talk with your doctor before you start a new exercise. Your doctor may tell you to change: How much insulin or medicines you  take. How much food you eat. Lifestyle Do not smoke or use any products that contain nicotine or tobacco. If you need help quitting, ask your doctor. If you drink alcohol and your doctor says that it is safe for you: Limit how much you have to: 0-1 drink a day for women who are not pregnant. 0-2 drinks a day for men. Know how much alcohol is in your drink. In the [place] one drink equals one 12 oz bottle of beer (355 mL), one 5 oz glass of wine (148 mL), or one 1 oz glass of hard liquor (44 mL). Learn to deal with stress. If you need help, ask your doctor. Body care  Stay up to date with your shots (immunizations). Have your eyes and feet checked by a  doctor as often as told. Check your skin and feet every day. Check for cuts, bruises, redness, blisters, or sores. Brush your teeth and gums two times a day. Floss one or more times a day. Go to the dentist one or more times every 6 months. Stay at a healthy weight. General instructions Share your diabetes care plan with: Your work or school. People you live with. Carry a card or wear jewelry that says you have diabetes. Keep all follow-up visits. Questions to ask your doctor Do I need to meet with a certified expert in diabetes education and care? Where can I find a support group? Where to find more information For help and guidance and more information about diabetes, please go to: American Diabetes Association: www.diabetes.org American Association of Diabetes Care and Education Specialists: www.diabeteseducator.org International Diabetes Federation: MemberVerification.ca Summary When you have type 2 diabetes, you must make sure your blood sugar (glucose) stays in a healthy range. You can do this with nutrition, exercise, medicines and insulin, and support from doctors and others. Check your blood sugar every day, or as often as told. Having diabetes can raise your risk for other long-term health problems. You may get medicines to help  prevent these problems. Share your diabetes management plan with people at work, school, and home. Keep all follow-up visits. This information is not intended to replace advice given to you by your health care provider. Make sure you discuss any questions you have with your health care provider. Document Revised: 10/24/2020 Document Reviewed: 10/24/2020 Elsevier Patient Education  [place].  Patient agreed to services and verbal consent obtained.   Patient verbalizes understanding of instructions provided today and agrees to view in [place].  Telephone follow up appointment with care management team member scheduled for: 05/29/21 at 2 PM  [place], [facility], CDE Care Management Coordinator [facility] [phone], Mobile [phone]   CLINICAL CARE PLAN: Patient Care Plan: Cardiovascular disease  (HF,CAD, AF, HTN and HLD)     Problem Identified: Lack of long term self management plan for Cardiovascular disease  (HF,CAD, AF, HTN and HLD)   Priority: High     Goal: Symptom Exacerbation Prevented or Minimized   Start Date: 04/28/2021  Expected End Date: 10/11/2021  This Visit's Progress: On track  Priority: High  Note:   Current Barriers:  Knowledge deficits related to basic Cardiovascular disease  (HF,CAD, AF, HTN and HLD) pathophysiology and self care management Unable to independently self manage Cardiovascular disease  (HF,CAD, AF, HTN and HLD) Does not adhere to provider recommendations re:  Does not adhere to prescribed medication regimen States he does not weigh daily and he has not been sending his CardioMem reading daily.  States his weight has been staying stable from 210-213.  Denies any increase in shortness of breath, swelling or chest pains.  States he still has some dizziness sometimes when he is at the store.  States he does forget to take his PM medications about 2-3 times a week.  States he does not use a pill box but he does  have a pill box. States he does eat fast food 2-3 times a week such as [employer] or [employer] when he is keeping his grandson. Nurse Case Manager Clinical Goal(s):  patient will weigh self daily and record patient will verbalize understanding of Heart Failure Action Plan and when to call doctor patient will take all Heart Failure mediations as prescribed Interventions:  Collaboration with [name], MD regarding development  and update of comprehensive plan of care as evidenced by provider attestation and co-signature Inter-disciplinary care team collaboration (see longitudinal plan of care) Basic overview and discussion of pathophysiology of Cardiovascular disease  (HF,CAD, AF, HTN and HLD) Provided written and verbal education on low sodium diet Reviewed Heart Failure Action Plan in depth and provided written copy-mailed [place]  calendar with action plan Assessed for scales in home-has scales Discussed importance of daily weight Reviewed role of diuretics in prevention of fluid overload Reived to try to send in his CardioMem dialy Reviewed to try to avoid fast food with higher sodium  Reivewed to try filling his medications in his pill box to see if that will help him remember to take his evening pills Sent AM/PM medication box Self-Care Activities:  Takes Heart Failure Medications as prescribed Weighs daily and record (notifying MD of 3 lb weight gain over night or 5 lb in a week) Verbalizes understanding of and follows CHF Action Plan Adheres to low sodium diet  Patient Goals:  - Take Heart Failure Medications as prescribed - Weigh daily and record (notify MD with 3 lb weight gain over night or 5 lb in a week) - Follow CHF Action Plan - Adhere to low sodium diet - begin a heart failure diary - develop a rescue plan - eat more whole grains, fruits and vegetables, lean meats and healthy fats - follow rescue plan if symptoms flare-up - know when to call the  doctor - track symptoms and what helps feel better or worse - dress right for the weather, hot or cold - pace activity allowing for rest - call office if I gain more than 2 pounds in one day or 5 pounds in one week - keep legs up while sitting - track weight in diary - use salt in moderation - watch for swelling in feet, ankles and legs every day - weigh myself daily - call for medicine refill 2 or 3 days before it runs out - keep a list of all the medicines I take; vitamins and herbals too - learn to read medicine labels - use a pillbox to sort medicine - use an alarm clock or phone to remind me to take my medicine Follow Up Plan: Telephone follow up appointment with care management team member scheduled for: 05/29/21  at 2 PM The patient has been provided with contact information for the care management team and has been advised to call with any health related questions or concerns.      Patient Care Plan: Diabetes Type 2 (Adult)     Problem Identified: Glycemic Management (Diabetes, Type 2)   Priority: Medium     Long-Range Goal: Glycemic Management Optimized   Start Date: 04/28/2021  Expected End Date: 10/11/2021  This Visit's Progress: On track  Priority: Medium  Note:   Objective:  Lab Results  Component Value Date   HGBA1C 9.0 (A) 02/27/2021   Lab Results  Component Value Date   CREATININE 1.39 04/12/2021   CREATININE 1.51 (H) 03/31/2021   CREATININE 1.23 03/21/2021   No results found for: EGFR Current Barriers:  Knowledge Deficits related to basic Diabetes pathophysiology and self care/management Knowledge Deficits related to medications used for management of diabetes Unable to independently self manage Type 2 diabetes Does not adhere to provider recommendations re:  Does not adhere to prescribed medication regimen States he checks his blood sugars every few days with fasting reading of around 145 and if he checks later in  the day 175.  Denies any hypoglycemia.   States he sometimes forgets to take his insulin.  Declines pharmacy referral at this time.  States he ties to eat fruit and vegetables every day. States he has a front window that leaks water into his house that needs to be repaired Case Manager Clinical Goal(s):  patient will demonstrate improved adherence to prescribed treatment plan for diabetes self care/management as evidenced by: daily monitoring and recording of CBG  adherence to ADA/ carb modified diet adherence to prescribed medication regimen contacting provider for new or worsened symptoms or questions Interventions:  Collaboration with [name], MD regarding development and update of comprehensive plan of care as evidenced by provider attestation and co-signature Inter-disciplinary care team collaboration (see longitudinal plan of care) Provided education to patient about basic DM disease process Reviewed medications with patient and discussed importance of medication adherence Discussed plans with patient for ongoing care management follow up and provided patient with direct contact information for care management team Provided patient with written educational materials related to hypo and hyperglycemia and importance of correct treatment Reviewed scheduled/upcoming provider appointments including: Dr. Aundra Dubin 05/03/21, Dr. Loanne Drilling 05/05/21 Advised patient, providing education and rationale, to check cbg daily and record, calling provider for findings outside established parameters.   Referral made to pharmacy team for assistance with declines referral to assist with adherence at this time Referral made to community resources care guide team for assistance with resources for home repairs Review of patient status, including review of consultants reports, relevant laboratory and other test results, and medications completed. Self-Care Activities - Self administers oral medications as prescribed Self administers insulin as  prescribed Attends all scheduled provider appointments Checks blood sugars as prescribed and utilize hyper and hypoglycemia protocol as needed Adheres to prescribed ADA/carb modified Patient Goals: - check blood sugar at prescribed times - check blood sugar if I feel it is too high or too low - enter blood sugar readings and medication or insulin into daily log - take the blood sugar log to all doctor visits - take the blood sugar meter to all doctor visits - change to whole grain breads, cereal, pasta - drink 6 to 8 glasses of water each day - eat fish at least once per week - fill half of plate with vegetables - limit fast food meals to no more than 1 per week - manage portion size - prepare main meal at home 3 to 5 days each week - reduce red meat to 2 to 3 times a week - switch to low-fat or skim milk - switch to sugar-free drinks - keep appointment with eye doctor - check feet daily for cuts, sores or redness - keep feet up while sitting - trim toenails straight across - wash and dry feet carefully every day - wear comfortable, cotton socks - wear comfortable, well-fitting shoes Follow Up Plan: Telephone follow up appointment with care management team member scheduled for: 05/29/21 at 2 PM The patient has been provided with contact information for the care management team and has been advised to call with any health related questions or concerns.

## 2021-04-29 ENCOUNTER — Other Ambulatory Visit: Payer: Self-pay

## 2021-04-29 ENCOUNTER — Encounter (HOSPITAL_COMMUNITY): Payer: Self-pay | Admitting: Emergency Medicine

## 2021-04-29 ENCOUNTER — Emergency Department (HOSPITAL_COMMUNITY): Payer: Medicare Other

## 2021-04-29 ENCOUNTER — Emergency Department (HOSPITAL_COMMUNITY)
Admission: EM | Admit: 2021-04-29 | Discharge: 2021-04-30 | Disposition: A | Discharge status: Home / Self Care | Payer: Medicare Other | Attending: Emergency Medicine | Admitting: Emergency Medicine

## 2021-04-29 DIAGNOSIS — E119 Type 2 diabetes mellitus without complications: Secondary | ICD-10-CM | POA: Insufficient documentation

## 2021-04-29 DIAGNOSIS — I4891 Unspecified atrial fibrillation: Secondary | ICD-10-CM | POA: Insufficient documentation

## 2021-04-29 DIAGNOSIS — Z79899 Other long term (current) drug therapy: Secondary | ICD-10-CM | POA: Insufficient documentation

## 2021-04-29 DIAGNOSIS — D631 Anemia in chronic kidney disease: Secondary | ICD-10-CM | POA: Insufficient documentation

## 2021-04-29 DIAGNOSIS — H538 Other visual disturbances: Secondary | ICD-10-CM | POA: Insufficient documentation

## 2021-04-29 DIAGNOSIS — I130 Hypertensive heart and chronic kidney disease with heart failure and stage 1 through stage 4 chronic kidney disease, or unspecified chronic kidney disease: Secondary | ICD-10-CM | POA: Diagnosis not present

## 2021-04-29 DIAGNOSIS — Z955 Presence of coronary angioplasty implant and graft: Secondary | ICD-10-CM | POA: Insufficient documentation

## 2021-04-29 DIAGNOSIS — E876 Hypokalemia: Secondary | ICD-10-CM | POA: Insufficient documentation

## 2021-04-29 DIAGNOSIS — I25110 Atherosclerotic heart disease of native coronary artery with unstable angina pectoris: Secondary | ICD-10-CM | POA: Insufficient documentation

## 2021-04-29 DIAGNOSIS — N183 Chronic kidney disease, stage 3 unspecified: Secondary | ICD-10-CM | POA: Diagnosis not present

## 2021-04-29 DIAGNOSIS — Z7984 Long term (current) use of oral hypoglycemic drugs: Secondary | ICD-10-CM | POA: Diagnosis not present

## 2021-04-29 DIAGNOSIS — I5032 Chronic diastolic (congestive) heart failure: Secondary | ICD-10-CM | POA: Insufficient documentation

## 2021-04-29 DIAGNOSIS — Z7901 Long term (current) use of anticoagulants: Secondary | ICD-10-CM | POA: Insufficient documentation

## 2021-04-29 DIAGNOSIS — Z87891 Personal history of nicotine dependence: Secondary | ICD-10-CM | POA: Diagnosis not present

## 2021-04-29 DIAGNOSIS — M545 Low back pain, unspecified: Secondary | ICD-10-CM | POA: Insufficient documentation

## 2021-04-29 DIAGNOSIS — R42 Dizziness and giddiness: Secondary | ICD-10-CM

## 2021-04-29 DIAGNOSIS — Z794 Long term (current) use of insulin: Secondary | ICD-10-CM | POA: Insufficient documentation

## 2021-04-29 LAB — CBC
HCT: 48.7 % (ref 39.0–52.0)
Hemoglobin: 15.2 g/dL (ref 13.0–17.0)
MCH: 24.1 pg — ABNORMAL LOW (ref 26.0–34.0)
MCHC: 31.2 g/dL (ref 30.0–36.0)
MCV: 77.3 fL — ABNORMAL LOW (ref 80.0–100.0)
Platelets: 197 10*3/uL (ref 150–400)
RBC: 6.3 MIL/uL — ABNORMAL HIGH (ref 4.22–5.81)
RDW: 20.3 % — ABNORMAL HIGH (ref 11.5–15.5)
WBC: 6.3 10*3/uL (ref 4.0–10.5)
nRBC: 0 % (ref 0.0–0.2)

## 2021-04-29 LAB — COMPREHENSIVE METABOLIC PANEL
ALT: 15 U/L (ref 0–44)
AST: 18 U/L (ref 15–41)
Albumin: 3.5 g/dL (ref 3.5–5.0)
Alkaline Phosphatase: 69 U/L (ref 38–126)
Anion gap: 9 (ref 5–15)
BUN: 16 mg/dL (ref 8–23)
CO2: 24 mmol/L (ref 22–32)
Calcium: 9.2 mg/dL (ref 8.9–10.3)
Chloride: 105 mmol/L (ref 98–111)
Creatinine, Ser: 1.53 mg/dL — ABNORMAL HIGH (ref 0.61–1.24)
GFR, Estimated: 50 mL/min — ABNORMAL LOW (ref >60)
Glucose, Bld: 188 mg/dL — ABNORMAL HIGH (ref 70–99)
Potassium: 3.2 mmol/L — ABNORMAL LOW (ref 3.5–5.1)
Sodium: 138 mmol/L (ref 135–145)
Total Bilirubin: 0.7 mg/dL (ref 0.3–1.2)
Total Protein: 7.4 g/dL (ref 6.5–8.1)

## 2021-04-29 LAB — I-STAT CHEM 8, ED
BUN: 18 mg/dL (ref 8–23)
Calcium, Ion: 1.18 mmol/L (ref 1.15–1.40)
Chloride: 105 mmol/L (ref 98–111)
Creatinine, Ser: 1.5 mg/dL — ABNORMAL HIGH (ref 0.61–1.24)
Glucose, Bld: 190 mg/dL — ABNORMAL HIGH (ref 70–99)
HCT: 51 % (ref 39.0–52.0)
Hemoglobin: 17.3 g/dL — ABNORMAL HIGH (ref 13.0–17.0)
Potassium: 3.2 mmol/L — ABNORMAL LOW (ref 3.5–5.1)
Sodium: 141 mmol/L (ref 135–145)
TCO2: 23 mmol/L (ref 22–32)

## 2021-04-29 LAB — DIFFERENTIAL
Abs Immature Granulocytes: 0.02 10*3/uL (ref 0.00–0.07)
Basophils Absolute: 0.1 10*3/uL (ref 0.0–0.1)
Basophils Relative: 1 %
Eosinophils Absolute: 0.3 10*3/uL (ref 0.0–0.5)
Eosinophils Relative: 5 %
Immature Granulocytes: 0 %
Lymphocytes Relative: 32 %
Lymphs Abs: 2 10*3/uL (ref 0.7–4.0)
Monocytes Absolute: 0.7 10*3/uL (ref 0.1–1.0)
Monocytes Relative: 12 %
Neutro Abs: 3.1 10*3/uL (ref 1.7–7.7)
Neutrophils Relative %: 50 %

## 2021-04-29 LAB — PROTIME-INR
INR: 1.2 (ref 0.8–1.2)
Prothrombin Time: 14.9 seconds (ref 11.4–15.2)

## 2021-04-29 LAB — APTT: aPTT: 38 seconds — ABNORMAL HIGH (ref 24–36)

## 2021-04-29 MED ORDER — SODIUM CHLORIDE 0.9% FLUSH
3.0000 mL | Freq: Once | INTRAVENOUS | Status: AC
Start: 2021-04-29 — End: 2021-04-30
  Administered 2021-04-30: 3 mL via INTRAVENOUS

## 2021-04-29 NOTE — ED Provider Notes (Signed)
[facility] EMERGENCY DEPARTMENT Provider Note   CSN: [id number] Arrival date & time: 04/29/21  2004     History Chief Complaint  Patient presents with   Dizziness    [name] [name] is a 65 y.o. male.  The history is provided by the patient and medical records.  Dizziness [name] [name] is a 65 y.o. male who presents to the Emergency Department complaining of dizziness.  He presents to the ED accompanied by his daughter for evaluation of dizziness that started when he woke up.  He got up to walk and felt unsteady and like he was falling to the side. He has experienced similar episodes intermittently over the last several months.  Usually it happens at the grocery when  he is standing and he needs to go and rest.  Episodes last about 4-5 minutes at a time.  Sxs are improving and essentially resolved on ED evaluation.    Has back pain - lower, bilateral sides - started today.  No new injuries.    No HA, ear pain, neck pain.  Sometimes has blurry vision during these episodes.  Does not feel like he will pass out.  Does not happen every time he stands.    Feels weak in general. Has chest pain (has occasionally and has on ED evaluation), described as minimum.  No sob.  No lower extremity edema.      Past Medical History:  Diagnosis Date   Anginal pain ([place])    Anxiety    Arrhythmia    CAD (coronary artery disease), native coronary artery    a. Nonobstructive CAD by cath 2013 - diffuse distal and branch vessel CAD, no severe disease in the major coronaries, LV mild global hypokinesis, EF 45%. b. ETT-Sestamibi 5/14: EF 31%, small fixed inferior defect with no ischemia.   Cholecystitis    Chronic CHF ([place])    a. Mixed ICM/NICM (?EtOH). EF 35% in 2008. Echo 5/13: EF 60-65%, mod LVH, EF 45% on V gram in 12/2011. EF 12/2012: EF 50-55%, mild LVH, inferobasal HK, mild MR. ETT-Ses 5/14 EF 41%. Cardiac MRI 5/14: EF 44%, mild global HK, subepicardial delayed  enhancement in nonspecific RV insertion pattern.   COLONIC POLYPS, HX OF 12/30/2006   Gout    H/O atrial flutter 2007   a. Ablations in 2007, 2008.   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     Shortness of breath dyspnea    with excertion   Sleep apnea    "suppose to send mask but they never did" (05/03/2015)   Type II diabetes mellitus [facility])     Patient Active Problem List   Diagnosis Date Noted   Syncope 02/09/2020   Generalized weakness 06/27/2019   Acute on chronic congestive heart failure with left ventricular diastolic dysfunction ([place]) 06/26/2019  Anemia due to chronic kidney disease 06/26/2019   CKD (chronic kidney disease), stage III ([place]) 06/26/2019   Type 2 diabetes mellitus without complication ([place]) 68/07/7516   Iron deficiency anemia due to chronic blood loss 06/26/2019   History of atrial fibrillation    History of type 2 diabetes mellitus    Anemia    Chest pain 04/24/2019   Overweight (BMI 25.0-29.9) 02/28/2017   Unstable angina ([place]) 08/03/2016   S/P laparoscopic cholecystectomy 02/07/2016   Coronary artery disease involving native coronary artery with unstable angina pectoris ([place]) 12/29/2015   Diabetes ([place]) 11/12/2015   Chronic anticoagulation 08/22/2015   Atrial flutter with controlled response ([place]) 08/22/2015   RUQ pain    Cholelithiases- drain in  place 06/04/2015   Essential hypertension    Chronic diastolic CHF (congestive heart failure) ([place]) 04/24/2015   Excessive daytime sleepiness 11/25/2014   Obstructive sleep apnea 11/25/2014   NICM (nonischemic cardiomyopathy) ([place]) 03/11/2014   Anticoagulation goal of INR 2 to 3 10/19/2013   Eczema 10/19/2013   Low libido 10/16/2013   Persistent atrial fibrillation ([place]) 10/06/2013   Rectal bleeding 12/18/2011   HYPERCHOLESTEROLEMIA 07/11/2010   DENTAL CARIES 06/30/2010   GOUT 05/05/2009   HEPATITIS B, CHRONIC 12/30/2006   OSTEOCHONDROMA 12/30/2006   Mitral valve disorder 12/30/2006   LIVER FUNCTION TESTS, ABNORMAL, HX OF 12/30/2006   COLONIC POLYPS, HX OF 12/30/2006   COLONOSCOPY, HX OF 05/12/2001    Past Surgical History:  Procedure Laterality Date   A FLUTTER ABLATION  2007, 2008   catheter ablation    CARDIAC CATHETERIZATION N/A 05/03/2015   Procedure: Right/Left Heart Cath and Coronary Angiography;  Surgeon: Larey Dresser, MD;  Location: [place] CV LAB;  Service: Cardiovascular;  Laterality: N/A;   CARDIAC CATHETERIZATION N/A 05/03/2015   Procedure: Coronary Stent Intervention;  Surgeon: Jettie Booze, MD;  Location: [place] CV LAB;  Service: Cardiovascular;  Laterality: N/A;   CARDIAC CATHETERIZATION N/A 11/04/2015   Procedure: Left Heart Cath and Coronary Angiography;  Surgeon: Larey Dresser, MD;  Location: [place] CV LAB;  Service: Cardiovascular;  Laterality: N/A;   CARDIOVERSION N/A 08/24/2015   Procedure: CARDIOVERSION;  Surgeon: Thayer Headings, MD;  Location: [facility] ENDOSCOPY;  Service: Cardiovascular;  Laterality: N/A;   CHOLECYSTECTOMY N/A 02/07/2016   Procedure: LAPAROSCOPIC CHOLECYSTECTOMY WITH  INTRAOPERATIVE CHOLANGIOGRAM;  Surgeon: Greer Pickerel, MD;  Location: [facility] ORS;  Service: General;  Laterality: N/A;   COLONOSCOPY WITH PROPOFOL N/A 04/28/2019   Procedure: COLONOSCOPY WITH PROPOFOL;  Surgeon: Ronnette Juniper, MD;  Location: [place];  Service:  Gastroenterology;  Laterality: N/A;   CORONARY ANGIOPLASTY  12/05/01   ESOPHAGOGASTRODUODENOSCOPY (EGD) WITH PROPOFOL N/A 04/27/2019   Procedure: ESOPHAGOGASTRODUODENOSCOPY (EGD) WITH PROPOFOL;  Surgeon: Ronnette Juniper, MD;  Location: [place];  Service: Gastroenterology;  Laterality: N/A;   GIVENS CAPSULE STUDY N/A 06/29/2019   Procedure: GIVENS CAPSULE STUDY;  Surgeon: Wilford Corner, MD;  Location: [place];  Service: Endoscopy;  Laterality: N/A;   LEFT HEART CATH AND CORONARY ANGIOGRAPHY N/A 12/28/2016   Procedure: Left Heart Cath and Coronary Angiography;  Surgeon: Larey Dresser, MD;  Location: [place] CV LAB;  Service: Cardiovascular;  Laterality: N/A;   LEFT HEART CATHETERIZATION WITH CORONARY ANGIOGRAM N/A 12/19/2011   Procedure: LEFT HEART CATHETERIZATION WITH CORONARY ANGIOGRAM;  Surgeon: Larey Dresser, MD;  Location: [facility] CATH LAB;  Service: Cardiovascular;  Laterality: N/A;   OSTEOCHONDROMA EXCISION Left 1972   "took bone tumor off my shoulder"   POLYPECTOMY  04/28/2019   Procedure: POLYPECTOMY;  Surgeon: Ronnette Juniper, MD;  Location: [facility] ENDOSCOPY;  Service: Gastroenterology;;   PRESSURE SENSOR/CARDIOMEMS N/A 02/01/2020   Procedure: PRESSURE SENSOR/CARDIOMEMS;  Surgeon: Larey Dresser, MD;  Location: [place] CV LAB;  Service: Cardiovascular;  Laterality: N/A;   RIGHT HEART CATH N/A 02/01/2020   Procedure: RIGHT HEART CATH;  Surgeon: Larey Dresser, MD;  Location: [place] CV LAB;  Service: Cardiovascular;  Laterality: N/A;   RIGHT/LEFT HEART CATH AND CORONARY ANGIOGRAPHY N/A 07/01/2019   Procedure: RIGHT/LEFT HEART CATH AND CORONARY ANGIOGRAPHY;  Surgeon: Larey Dresser, MD;  Location: [place] CV LAB;  Service: Cardiovascular;  Laterality: N/A;       Family History  Problem Relation Age of Onset   Diabetes Mother    Hypertension Mother    Heart attack Neg Hx    Stroke Neg Hx     Social History   Tobacco Use   Smoking status: Former    Packs/day:  4.00    Years: 18.00    Pack years: 72.00    Types: Cigarettes    Quit date: 08/14/1983    Years since quitting: 37.7   Smokeless tobacco: Never  Vaping Use   Vaping Use: Never used  Substance Use Topics   Alcohol use: Yes    Alcohol/week: 16.0 standard drinks    Types: 6 Cans of beer, 10 Shots of liquor per week    Comment: occasionally   Drug use: Yes    Types: Marijuana    Comment: twice a week    Home Medications Prior to Admission medications   Medication Sig Start Date End Date Taking? Authorizing Provider  ACCU-CHEK SOFTCLIX LANCETS lancets Use as instructed for three times daily testing of blood glucose 09/06/16  Yes Jegede, Olugbemiga E, MD  acetaminophen (TYLENOL) 500 MG tablet Take 1,000 mg by mouth every 6 (six) hours as needed (pain).   Yes [provider]  amLODipine (NORVASC) 5 MG tablet Take 1 tablet by mouth once daily Patient taking differently: Take 5 mg by mouth daily. 03/21/21  Yes Larey Dresser, MD  apixaban (ELIQUIS) 5 MG TABS tablet Take 5 mg by mouth 2 (two) times daily.   Yes [provider]  atorvastatin (LIPITOR) 80 MG tablet Take 1 tablet (80 mg total) by mouth daily. 09/07/20  Yes Milford, Maricela Bo, FNP  blood glucose meter kit and supplies KIT Dispense based on patient and insurance preference. Use up to four times daily as directed. (FOR ICD-9 250.00, 250.01). 07/20/20  Yes [name], MD  Blood Glucose Monitoring Suppl (ACCU-CHEK AVIVA PLUS) w/Device KIT Use as directed for 3 times daily testing of blood glucose 09/06/16  Yes Jegede, Olugbemiga E, MD  carvedilol (COREG) 25 MG tablet TAKE 1 TABLET BY MOUTH TWICE DAILY WITH A MEAL Patient taking differently: Take 25 mg by mouth 2 (two) times daily with a meal. 01/12/20  Yes Larey Dresser, MD  colchicine 0.6 MG tablet On day 1 take 2 tablets at first sign of gout flare.  Take 1 tablet 1 hour later.  Then take 1 tablet daily starting the next day until resolution of gout flare. 04/12/21   Yes [name], MD  empagliflozin (JARDIANCE) 10 MG TABS tablet Take 1 tablet (10 mg total) by mouth daily before breakfast. 03/21/21  Yes Milford, Maricela Bo, FNP  guaiFENesin (MUCINEX PO) Take 1 tablet by mouth 2 (two) times daily as needed (cough/congestion).    Yes [provider]  hydrALAZINE (APRESOLINE) 50 MG tablet TAKE 1 & 1/2 (ONE & ONE-HALF) TABLETS BY MOUTH TWICE DAILY. MUST MAKE FOLLOW UP APPT FOR FURTHER REFILLS Patient taking differently: Take 75 mg by mouth in the morning and at bedtime. 02/01/21  Yes Larey Dresser, MD  insulin glargine (LANTUS SOLOSTAR) 100 UNIT/ML Solostar Pen Inject 40 Units into the skin every morning. And pen needles 1/day 02/27/21  Yes Renato Shin, MD  isosorbide mononitrate (IMDUR) 120 MG 24 hr tablet Take 1 tablet by mouth once daily Patient taking differently: Take 120 mg by mouth daily. 12/20/20  Yes Larey Dresser, MD  meclizine (ANTIVERT) 12.5 MG tablet Take 1 tablet (12.5 mg total) by mouth 3 (three) times daily as needed for dizziness. 04/30/21  Yes Quintella Reichert, MD  metFORMIN (GLUCOPHAGE) 1000 MG tablet Take 1 tablet (1,000 mg total) by mouth daily with breakfast. TAKE 1 TABLET BY MOUTH TWICE DAILY WITH A MEAL Patient taking differently: Take 1,000 mg by mouth daily with breakfast. 02/27/21  Yes Renato Shin, MD  nitroGLYCERIN (NITROSTAT) 0.4 MG SL tablet Place 1 tablet (0.4 mg total) under the tongue every 5 (five) minutes as needed for chest pain (x 3 tabs daily). 09/07/20  Yes Milford, Maricela Bo, FNP  sotalol (BETAPACE) 120 MG tablet Take 1 tablet (120 mg total) by mouth 2 (two) times daily. 03/21/21  Yes Milford, Maricela Bo, FNP  torsemide (DEMADEX) 20 MG tablet Take 3 tablets (60 mg total) by mouth 2 (two) times daily. MUST MAKE FOLLOW UP APPOINTMENT FOR FURTHER REFILLS Patient taking differently: Take 60 mg by mouth 2 (two) times daily. 02/01/21  Yes Larey Dresser, MD  albuterol (VENTOLIN HFA) 108 (90 Base) MCG/ACT inhaler Inhale  1-2 puffs into the lungs every 6 (six) hours as needed for wheezing or shortness of breath. 04/06/20   Tasia Catchings, Amy V, PA-C  Continuous Blood Gluc Sensor (FREESTYLE LIBRE 2 SENSOR) MISC 1 Device by Does not apply route every 14 (fourteen) days. Patient not taking: No sig reported 02/28/21   Renato Shin, MD  glucose blood (ACCU-CHEK AVIVA PLUS) test strip Use as instructed for 3 times daily testing of blood glucose 09/06/16   Tresa Garter, MD  Lancet Devices [facility]) lancets Use as instructed for 3 times daily testing of blood glucose 09/06/16   Jegede, Olugbemiga E, MD  potassium chloride (KLOR-CON) 10 MEQ tablet Take 4 tablets (40 mEq total) by mouth daily. 03/21/21   Milford, Maricela Bo, FNP  TRUEPLUS LANCETS 28G MISC 1 each by Does not apply route 3 (three) times daily. 08/22/16   Tresa Garter, MD    Allergies    Shrimp [shellfish allergy], Ace inhibitors, and Other  Review of Systems   Review of Systems  Neurological:  Positive for dizziness.  All other systems reviewed and are negative.  Physical Exam Updated Vital Signs BP (!) 144/107   Pulse 68   Temp 98.4 F (36.9 C) (Oral)   Resp 17   SpO2 94%   Physical Exam Vitals and nursing note reviewed.  Constitutional:      Appearance: He is well-developed.  HENT:     Head: Normocephalic and atraumatic.  Cardiovascular:     Rate and Rhythm: Normal rate and regular rhythm.     Heart sounds: No murmur heard. Pulmonary:     Effort: Pulmonary effort is normal. No respiratory distress.     Breath sounds: Normal breath sounds.  Abdominal:     Palpations: Abdomen is soft.  Tenderness: There is no abdominal tenderness. There is no guarding or rebound.  Musculoskeletal:        General: No tenderness.  Skin:    General: Skin is warm and dry.  Neurological:     Mental Status: He is alert and oriented to person, place, and time.     Comments: No asymmetry of facial movements, 5/5 strength in all four extremities  with sensation to light touch intact in all four extremities.  Visual fields grossly intact.  No ataxia on FTN bilaterally  Psychiatric:        Behavior: Behavior normal.    ED Results / Procedures / Treatments   Labs (all labs ordered are listed, but only abnormal results are displayed) Labs Reviewed  APTT - Abnormal; Notable for the following components:      Result Value   aPTT 38 (*)    All other components within normal limits  CBC - Abnormal; Notable for the following components:   RBC 6.30 (*)    MCV 77.3 (*)    MCH 24.1 (*)    RDW 20.3 (*)    All other components within normal limits  COMPREHENSIVE METABOLIC PANEL - Abnormal; Notable for the following components:   Potassium 3.2 (*)    Glucose, Bld 188 (*)    Creatinine, Ser 1.53 (*)    GFR, Estimated 50 (*)    All other components within normal limits  URINALYSIS, ROUTINE W REFLEX MICROSCOPIC - Abnormal; Notable for the following components:   Glucose, UA >=500 (*)    Hgb urine dipstick TRACE (*)    All other components within normal limits  I-STAT CHEM 8, ED - Abnormal; Notable for the following components:   Potassium 3.2 (*)    Creatinine, Ser 1.50 (*)    Glucose, Bld 190 (*)    Hemoglobin 17.3 (*)    All other components within normal limits  PROTIME-INR  DIFFERENTIAL  URINALYSIS, MICROSCOPIC (REFLEX)  CBG MONITORING, ED  TROPONIN I (HIGH SENSITIVITY)    EKG EKG Interpretation  Date/Time:  Saturday April 29 2021 21:12:15 EDT Ventricular Rate:  75 PR Interval:  210 QRS Duration: 90 QT Interval:  446 QTC Calculation: 498 R Axis:   18 Text Interpretation: Sinus rhythm with 1st degree A-V block with Premature atrial complexes ST & T wave abnormality, consider inferolateral ischemia Prolonged QT Abnormal ECG similar compared to prior Confirmed by Quintella Reichert [id number]) on 04/29/2021 11:35:50 PM  Radiology CT HEAD WO CONTRAST  Result Date: 04/29/2021 CLINICAL DATA:  Dizziness, nonspecific. EXAM: CT  HEAD WITHOUT CONTRAST TECHNIQUE: Contiguous axial images were obtained from the base of the skull through the vertex without intravenous contrast. COMPARISON:  07/29/2017 FINDINGS: Brain: Mild cerebral atrophy. No ventricular dilatation. Low-attenuation changes in the deep white matter likely representing small vessel ischemic change. Similar pattern to previous study. No mass-effect or midline shift. No abnormal extra-axial fluid collections. Gray-white matter junctions are distinct. Basal cisterns are not effaced. No acute intracranial hemorrhage. Vascular: Moderate intracranial arterial vascular calcifications. Skull: Calvarium appears intact. Sinuses/Orbits: Mucosal thickening in the paranasal sinuses. No acute air-fluid levels. Mastoid air cells are clear. Other: None. IMPRESSION: No acute intracranial abnormalities. Mild atrophy and small vessel ischemic changes. Electronically Signed   By: Lucienne Capers M.D.   On: 04/29/2021 22:06   MR BRAIN WO CONTRAST  Result Date: 04/30/2021 CLINICAL DATA:  Vertigo EXAM: MRI HEAD WITHOUT CONTRAST TECHNIQUE: Multiplanar, multiecho pulse sequences of the brain and surrounding structures were obtained without intravenous  contrast. COMPARISON:  None. FINDINGS: Brain: No acute infarct, mass effect or extra-axial collection. No acute or chronic hemorrhage. There is multifocal hyperintense T2-weighted signal within the white matter. Generalized volume loss without a clear lobar predilection. The midline structures are normal. There are old bilateral cerebellar small vessel infarcts. Vascular: Major flow voids are preserved. Skull and upper cervical spine: Normal calvarium and skull base. Visualized upper cervical spine and soft tissues are normal. Sinuses/Orbits:Fluid level in the left maxillary sinus. Normal orbits. IMPRESSION: 1. No acute intracranial abnormality. 2. Findings of chronic small vessel ischemia and generalized volume loss. Electronically Signed   By: Ulyses Jarred M.D.   On: 04/30/2021 03:28    Procedures Procedures   Medications Ordered in ED Medications  sodium chloride flush (NS) 0.9 % injection 3 mL (3 mLs Intravenous Given 04/30/21 0052)  potassium chloride SA (KLOR-CON) CR tablet 20 mEq (20 mEq Oral Given 04/30/21 0108)    ED Course  I have reviewed the triage vital signs and the nursing notes.  Pertinent labs & imaging results that were available during my care of the patient were reviewed by me and considered in my medical decision making (see chart for details).    MDM Rules/Calculators/A&P                          patient here for evaluation of vertigo, has been experiencing intermittent episodes over the last several months that are similar. On ED evaluation he is neurologically intact and feeling improved. He is well perfused on examination. Presentation is not consistent with CVA, dissection, ACS, PE. He is able to ambulate at his baseline in the department. Labs with stable renal insufficiency. UA not consistent with UTI. He does have mild hypokalemia, replaced orally. Plan to discharge home with home care for vertigo with outpatient follow-up and return precautions  Final Clinical Impression(s) / ED Diagnoses Final diagnoses:  Vertigo    Rx / DC Orders ED Discharge Orders          Ordered    meclizine (ANTIVERT) 12.5 MG tablet  3 times daily PRN        04/30/21 0342             Quintella Reichert, MD 04/30/21 0345

## 2021-04-29 NOTE — ED Triage Notes (Signed)
Pt c/o dizziness that started this morning when he woke up, c/o generalized weakness as well. Denies headache.

## 2021-04-30 ENCOUNTER — Emergency Department (HOSPITAL_COMMUNITY): Payer: Medicare Other

## 2021-04-30 DIAGNOSIS — R42 Dizziness and giddiness: Secondary | ICD-10-CM | POA: Diagnosis not present

## 2021-04-30 LAB — URINALYSIS, ROUTINE W REFLEX MICROSCOPIC
Bilirubin Urine: NEGATIVE
Glucose, UA: >=500 mg/dL — AB
Ketones, ur: NEGATIVE mg/dL
Leukocytes,Ua: NEGATIVE
Nitrite: NEGATIVE
Protein, ur: NEGATIVE mg/dL
Specific Gravity, Urine: 1.015 (ref 1.005–1.030)
pH: 6 (ref 5.0–8.0)

## 2021-04-30 LAB — URINALYSIS, MICROSCOPIC (REFLEX): Bacteria, UA: NONE SEEN

## 2021-04-30 LAB — TROPONIN I (HIGH SENSITIVITY): Troponin I (High Sensitivity): 14 ng/L (ref <18)

## 2021-04-30 MED ORDER — MECLIZINE HCL 12.5 MG PO TABS: 12.5000 mg | ORAL_TABLET | Freq: Three times a day (TID) | ORAL | 0 refills | Status: DC | PRN

## 2021-04-30 MED ORDER — POTASSIUM CHLORIDE CRYS ER 20 MEQ PO TBCR
20.0000 meq | EXTENDED_RELEASE_TABLET | Freq: Once | ORAL | Status: AC
  Administered 2021-04-30: 20 meq via ORAL
  Filled 2021-04-30: qty 1

## 2021-04-30 NOTE — ED Notes (Signed)
Patient verbalizes understanding of d/c instructions. Opportunities for questions and answers were provided. Pt d/c from ED and ambulated to lobby to go home with family.

## 2021-05-01 ENCOUNTER — Telehealth: Payer: Self-pay | Admitting: Family Medicine

## 2021-05-01 ENCOUNTER — Telehealth (HOSPITAL_COMMUNITY): Payer: Self-pay | Admitting: Adult Health

## 2021-05-01 DIAGNOSIS — M109 Gout, unspecified: Secondary | ICD-10-CM

## 2021-05-01 MED ORDER — COLCHICINE 0.6 MG PO TABS: ORAL_TABLET | ORAL | 1 refills | Status: DC

## 2021-05-01 NOTE — Telephone Encounter (Signed)
Spoke with pt, got correct pharmacy. Refill sent.

## 2021-05-01 NOTE — Telephone Encounter (Signed)
Attempted to call regarding elevated Cardiomems reading.   Goal 24  04/30/21 Reading 31  He has follow up with Dr Aundra Dubin on 05/03/21   Darrick Grinder NP-C  2:54 PM

## 2021-05-01 NOTE — Telephone Encounter (Signed)
Patient called because he needs a refill on his gout medication colchicine 0.6 MG tablet  Patient states his gout is flaring up and it is painful   Good callback number is [phone]   Please Advise

## 2021-05-03 ENCOUNTER — Encounter (HOSPITAL_COMMUNITY): Payer: Self-pay | Admitting: Cardiology

## 2021-05-03 ENCOUNTER — Ambulatory Visit (HOSPITAL_COMMUNITY)
Admission: RE | Admit: 2021-05-03 | Discharge: 2021-05-03 | Disposition: A | Discharge status: Home / Self Care | Payer: Medicare Other | Source: Ambulatory Visit | Attending: Cardiology | Admitting: Cardiology

## 2021-05-03 ENCOUNTER — Telehealth: Payer: Self-pay

## 2021-05-03 ENCOUNTER — Other Ambulatory Visit: Payer: Self-pay

## 2021-05-03 DIAGNOSIS — E1122 Type 2 diabetes mellitus with diabetic chronic kidney disease: Secondary | ICD-10-CM | POA: Diagnosis not present

## 2021-05-03 DIAGNOSIS — Z7901 Long term (current) use of anticoagulants: Secondary | ICD-10-CM | POA: Insufficient documentation

## 2021-05-03 DIAGNOSIS — I130 Hypertensive heart and chronic kidney disease with heart failure and stage 1 through stage 4 chronic kidney disease, or unspecified chronic kidney disease: Secondary | ICD-10-CM | POA: Diagnosis not present

## 2021-05-03 DIAGNOSIS — Z955 Presence of coronary angioplasty implant and graft: Secondary | ICD-10-CM | POA: Diagnosis not present

## 2021-05-03 DIAGNOSIS — I480 Paroxysmal atrial fibrillation: Secondary | ICD-10-CM | POA: Insufficient documentation

## 2021-05-03 DIAGNOSIS — Z8249 Family history of ischemic heart disease and other diseases of the circulatory system: Secondary | ICD-10-CM | POA: Insufficient documentation

## 2021-05-03 DIAGNOSIS — G4733 Obstructive sleep apnea (adult) (pediatric): Secondary | ICD-10-CM | POA: Insufficient documentation

## 2021-05-03 DIAGNOSIS — Z794 Long term (current) use of insulin: Secondary | ICD-10-CM | POA: Diagnosis not present

## 2021-05-03 DIAGNOSIS — Z9049 Acquired absence of other specified parts of digestive tract: Secondary | ICD-10-CM | POA: Insufficient documentation

## 2021-05-03 DIAGNOSIS — Z87891 Personal history of nicotine dependence: Secondary | ICD-10-CM | POA: Insufficient documentation

## 2021-05-03 DIAGNOSIS — N189 Chronic kidney disease, unspecified: Secondary | ICD-10-CM | POA: Diagnosis not present

## 2021-05-03 DIAGNOSIS — I5032 Chronic diastolic (congestive) heart failure: Secondary | ICD-10-CM

## 2021-05-03 DIAGNOSIS — I25119 Atherosclerotic heart disease of native coronary artery with unspecified angina pectoris: Secondary | ICD-10-CM | POA: Insufficient documentation

## 2021-05-03 DIAGNOSIS — F1290 Cannabis use, unspecified, uncomplicated: Secondary | ICD-10-CM | POA: Diagnosis not present

## 2021-05-03 DIAGNOSIS — Z79899 Other long term (current) drug therapy: Secondary | ICD-10-CM | POA: Diagnosis not present

## 2021-05-03 DIAGNOSIS — I4892 Unspecified atrial flutter: Secondary | ICD-10-CM | POA: Insufficient documentation

## 2021-05-03 DIAGNOSIS — E785 Hyperlipidemia, unspecified: Secondary | ICD-10-CM | POA: Diagnosis not present

## 2021-05-03 LAB — BASIC METABOLIC PANEL
Anion gap: 11 (ref 5–15)
BUN: 17 mg/dL (ref 8–23)
CO2: 25 mmol/L (ref 22–32)
Calcium: 9.7 mg/dL (ref 8.9–10.3)
Chloride: 99 mmol/L (ref 98–111)
Creatinine, Ser: 1.66 mg/dL — ABNORMAL HIGH (ref 0.61–1.24)
GFR, Estimated: 45 mL/min — ABNORMAL LOW (ref >60)
Glucose, Bld: 175 mg/dL — ABNORMAL HIGH (ref 70–99)
Potassium: 3.6 mmol/L (ref 3.5–5.1)
Sodium: 135 mmol/L (ref 135–145)

## 2021-05-03 MED ORDER — TORSEMIDE 20 MG PO TABS: 80.0000 mg | ORAL_TABLET | Freq: Two times a day (BID) | ORAL | 11 refills | Status: DC

## 2021-05-03 NOTE — Patient Instructions (Addendum)
EKG done today.  Labs done today. We will contact you only if your labs are abnormal.  INCREASE Torsemide to 80mg  (4 tablets) by mouth  2 times daily.   No other medication changes were made. Please continue all current medications as prescribed.  Your physician recommends that you schedule a follow-up appointment in: 10 days for a lab only appointment and in 3 weeks with our [facility] here in office.   If you have any questions or concerns before your next appointment please send [place] a message through [place] or call our office at [phone].    TO LEAVE A MESSAGE FOR THE NURSE SELECT OPTION 2, PLEASE LEAVE A MESSAGE INCLUDING: YOUR NAME DATE OF BIRTH CALL BACK NUMBER REASON FOR CALL**this is important as we prioritize the call backs  YOU WILL RECEIVE A CALL BACK THE SAME DAY AS LONG AS YOU CALL BEFORE 4:00 PM   Do the following things EVERYDAY: Weigh yourself in the morning before breakfast. Write it down and keep it in a log. Take your medicines as prescribed Eat low salt foods--Limit salt (sodium) to 2000 mg per day.  Stay as active as you can everyday Limit all fluids for the day to less than 2 liters   At the [facility], you and your health needs are our priority. As part of our continuing mission to provide you with exceptional heart care, we have created designated Provider Care Teams. These Care Teams include your primary Cardiologist (physician) and Advanced Practice Providers (APPs- Physician Assistants and Nurse Practitioners) who all work together to provide you with the care you need, when you need it.   You may see any of the following providers on your designated Care Team at your next follow up: Dr Glori Bickers Dr Haynes Kerns, NP Lyda Jester, [place] Audry Riles, PharmD   Please be sure to bring in all your medications bottles to every appointment.

## 2021-05-03 NOTE — Telephone Encounter (Signed)
   Telephone encounter was:  Successful.  05/03/2021 Name: [name] [name] MRN: [id number] DOB: 1956-01-13  [name] [name] is a 65 y.o. year old male who is a primary care patient of [name], MD . The community resource team was consulted for assistance with Home Modifications  Care guide performed the following interventions:  Patient advised he has been waiting for a year to get his window fix. I have sent information for [place], [employer], [place] [employer] and [employer] by email.   Follow Up Plan:  Care guide will follow up with patient by phone over the next few days.  [place] management  [place], [place]  Main Phone: [phone]  E-mail: [email or web address]  Website: [email or web address]

## 2021-05-04 NOTE — Progress Notes (Signed)
Patient ID: [name] [name], male   DOB: September 14, 1955, 65 y.o.   MRN: [id number] PCP: [name], MD Cardiology: Dr. Aundra Dubin  65 y.o. with history of HTN, diabetes, paroxysmal atrial fibrillation, and CAD presents for cardiology followup.  He was admitted in 5/13 with chest pain with exertion.  LHC was done showing global HK with EF 45% and diffuse, severe distal and branch vessel disease.  There was no interventional option, but it was suspect that this disease could be causing his anginal-type pain.  Echo at that time was read as showing EF 60-65%.  He was admitted in 5/14 with hypertensive urgency and chest pain.  He ruled out for MI and BP was controlled.  His EF was 50-55% by echo.  ETT-Sestamibi done as outpatient showed small fixed inferior defect with no ischemia but EF was 31%.  He was quite hypertensive during the study.  Cardiac MRI done to confirm EF in 5/14 showed EF 44%.  He had an episode of atrial fibrillation/RVR in 3/15 but went back into NSR.  Echo in 2/16 showed EF 55-60% with moderate diastolic dysfunction.   He had a Cardiolite in 9/16 showing inferior ischemia.  He was having exertional dyspnea with less activity than normal.  There was concern for worsened CAD and he was sent for [facility] in 9/16. By RHC, filling pressures were not significantly elevated. He had a severe distal RCA stenosis that was treated with DES x 2.  Echo showed EF 55-60%.    In 10/16, he was admitted with acute cholecystitis.  He received a cholecystostomy tube.  Cholecystectomy done in 6/17.   Atrial fibrillation in 1/17, cardioverted to NSR.   Recurrent concerning chest pain in 3/17.  Repeat LHC showed diffuse distal and branch vessel disease consistent with poorly-controlled DM, but patent RCA stents.    He was admitted with atrial fibrillation in 12/17.  Started on sotalol and converted to NSR.    He had recurrent chest pain in 5/18 with abnormal Cardiolite.  Cath was done again in 5/18,  showing diffuse CAD with no good interventional options.   Admitted 11/20 with increased dyspnea and exertional chest pain. Hgb went down to 8.1. GI consulted.  Had [place] that showed severe distal and branch vessel disease but no interventional targets and not good anatomy for CABG. Continued on medical management. Capsule endoscopy without recent or active bleeding, no AVMs. He went into atrial flutter during his hospitalization. He went back into NSR, however, after discharge.  +Snoring and daytime sleepiness.   He has had episodes of syncope associated with vigorous coughing.   He was in the ER with atrial flutter and dyspnea in 4/21, in NSR today on sotalol.    Patient returns for followup of CHF.  Weight down 7 lbs.  However, he has been more short of breath recently.  He is dyspneic walking around his house currently.  He has had recent pain from gout.  He has had abdominal swelling. He has rare atypical chest pain.  He has chronic orthopnea and sleeps on 2 thick pillows. He does get occasional episodes of a "spinning sensation" attributed to vertigo.    ECG (personally reviewed): NSR, 1st degree AVB, blocked PAC, LVH, QTc 483 msec   Cardiomems: PADP 31 mmHg (goal 24)  Labs (5/13): K 3.6, creatinine 0.93, LDL 85, HDL 57 Labs (7/13): K 3.9, creatinine 1.1, LDL 57, HDL 51  Labs (5/14): K 4, creatinine 1.25 Labs (8/14): K 3.7, creatinine 0.9, LDL  103, HDL 42 Labs (8/15): K 3.5 creatinine 1.0, BNP 75 Labs (2/16): K 3.8, creatinine 1.1 Labs (9/16): K 3.5, creatinine 1.03, BNP 59, HDL 40, LDL 111, TGs 333 Labs (11/16): K 3.6, creaitnine 1.07, LFTs normal, LDL 44 Labs (6/17): K 3.4, creatinine 1.52 => 1.13 Labs (1/18): K 3.9, creatinine 1.25 Labs (6/18): K 3.8, creatinine 1.21, HCT 39.7 Labs (8/18): LDL 39, HDL 43 Labs (12/18): 4.4, creatinine 1.25, LFTs normal Labs (8/19): K 3.6, creatinine 1.13, LDL 68, HDL 49  Labs (11/20): K 3.5, creatinine 1.33, LDL 25 Labs (4/21): K 4.0, SCr 1.14, BNP  266  Labs (6/21): K 3.7, creatinine 1.25 Labs (12/21): LDL 25 Labs (1/22): BNP 132 Labs (2/22): K 3.4, creatinine 1.44 Labs (8/22): LDL 81 Labs (9/22): K 3.2, creatinine 1.53  PMH: 1. DM2 2. Gout 3. HTN: Cough with ACEI.  4. Cardiomyopathy: Suspect mixed ischemic/nonischemic (?ETOH-related).  EF 35% in 2008.  Echo in 5/13 showed EF 60-65% with moderate LVH but EF was 45% on LV-gram in 5/13.  Echo (5/14) with EF 50-55%, mild LVH, inferobasal HK, mild MR.  ETT-Sestamibi in 5/14, however, showed EF 31%.  Cardiac MRI (6/14) with EF 44%, mild global hypokinesis, subepicardial delayed enhancement in a nonspecific RV insertion site pattern.  Echo (2/16) with EF 55-60%, moderate diastolic dysfunction. RHC (9/16) with mean RA 8, PA 39/20 mean 28, mean PCWP 14, CI 2.2. Echo (9/16) with EF 55-60%, grade II diastolic dysfunction.  - Echo (6/17) with EF 55-60%, moderate LVH, grade II diastolic dysfunction, mild AI, mild MR.  - RHC (11/20): mean RA 4, PA 46/14 mean 26, mean PCWP 13, CI 3.23 - Echo (9/20): EF 60-65%, moderate LVH, normal RV size and systolic function, mild-moderate MR, mild AI.  - Echo (5/21): EF 55%, RV normal - Has Cardiomems.  5. CAD: LHC in 2010 with mild nonobstructive disease.  LHC (5/13) with diffuse distal and branch vessel disease, mild global hypokinesis and EF 45%.  ETT-Sestamibi (5/14): EF 31%, small fixed inferior defect with no ischemia.  Lexiscan Cardiolite (9/16) with EF 26%, moderate inferior defect that was partially reversible, suggesting ischemia => High risk study. LHC (9/16) with 40-50% mLAD, diffuse up to 50% distal LAD, 90% ostium of branch off ramus, 95% dRCA => DES to RCA x 2, EF 55-60%.   - LHC (3/17) with diffuse branch and distal vessel disease c/w poorly controlled DM, RCA stents patent.  - LHC (5/18): Diffuse CAD with no good interventional option. 60% mRCA, 90% dLAD, 50% serial mid ramus stenoses, ramus branches with ostial 80-90% stenoses. - LHC (11/20): Severe  distal and branch vessel disease but no interventional targets and not good anatomy for CABG  6. H/o chronic HBV 7. Osteochondroma left shoulder.  8. Hyperlipidemia 9. Paroxysmal atrial fibrillation: Coumadin.  Developed cough and increased ESR with amiodarone.  DCCV in 1/17.  Recurrent atrial fibrillation in 12/17, sotalol started.  10. Atrial flutter: had ablations in 2007 and 2008.  - Zio patch in 4/21 with 20% atrial flutter.  11. OSA on CPAP.  12. Acute cholecystitis (10/16): Cholecystostomy tube placed.  Cholecystectomy 6/17.  13. CKD 14. Anemia - Suspected GI bleed but EGD/c-scope 9/20 without significant findings.  - Capsule Endoscopy (11/20) without recent or active bleeding, no AVMs.  15. Syncope: Suspected cough syncope.  16. OSA  SH: Married, prior smoker.  Some ETOH, occasionally heavy in past.  Does use occasional marijuana. Out of work Engineer, drilling. 2 daughters in grad school.   FH: CAD  ROS: All systems reviewed and negative except as per HPI.   Current Outpatient Medications  Medication Sig Dispense Refill   ACCU-CHEK SOFTCLIX LANCETS lancets Use as instructed for three times daily testing of blood glucose 100 each 12   acetaminophen (TYLENOL) 500 MG tablet Take 1,000 mg by mouth every 6 (six) hours as needed (pain).     albuterol (VENTOLIN HFA) 108 (90 Base) MCG/ACT inhaler Inhale 1-2 puffs into the lungs every 6 (six) hours as needed for wheezing or shortness of breath. 8 g 0   amLODipine (NORVASC) 5 MG tablet Take 1 tablet by mouth once daily 90 tablet 1   apixaban (ELIQUIS) 5 MG TABS tablet Take 5 mg by mouth 2 (two) times daily.     atorvastatin (LIPITOR) 80 MG tablet Take 1 tablet (80 mg total) by mouth daily. 90 tablet 3   blood glucose meter kit and supplies KIT Dispense based on patient and insurance preference. Use up to four times daily as directed. (FOR ICD-9 250.00, 250.01). 1 each 0   Blood Glucose Monitoring Suppl (ACCU-CHEK AVIVA PLUS) w/Device KIT Use as  directed for 3 times daily testing of blood glucose 1 kit 0   carvedilol (COREG) 25 MG tablet TAKE 1 TABLET BY MOUTH TWICE DAILY WITH A MEAL 180 tablet 3   colchicine 0.6 MG tablet On day 1 take 2 tablets at first sign of gout flare.  Take 1 tablet 1 hour later.  Then take 1 tablet daily starting the next day until resolution of gout flare. 10 tablet 1   empagliflozin (JARDIANCE) 10 MG TABS tablet Take 1 tablet (10 mg total) by mouth daily before breakfast. 30 tablet 11   glucose blood (ACCU-CHEK AVIVA PLUS) test strip Use as instructed for 3 times daily testing of blood glucose 100 each 12   guaiFENesin (MUCINEX PO) Take 1 tablet by mouth 2 (two) times daily as needed (cough/congestion).      hydrALAZINE (APRESOLINE) 50 MG tablet TAKE 1 & 1/2 (ONE & ONE-HALF) TABLETS BY MOUTH TWICE DAILY. MUST MAKE FOLLOW UP APPT FOR FURTHER REFILLS 90 tablet 5   insulin glargine (LANTUS SOLOSTAR) 100 UNIT/ML Solostar Pen Inject 40 Units into the skin every morning. And pen needles 1/day 45 mL 3   isosorbide mononitrate (IMDUR) 120 MG 24 hr tablet Take 1 tablet by mouth once daily 30 tablet 6   Lancet Devices (ACCU-CHEK SOFTCLIX) lancets Use as instructed for 3 times daily testing of blood glucose 1 each 0   meclizine (ANTIVERT) 12.5 MG tablet Take 1 tablet (12.5 mg total) by mouth 3 (three) times daily as needed for dizziness. 12 tablet 0   metFORMIN (GLUCOPHAGE) 1000 MG tablet Take 1 tablet (1,000 mg total) by mouth daily with breakfast. TAKE 1 TABLET BY MOUTH TWICE DAILY WITH A MEAL 60 tablet 3   nitroGLYCERIN (NITROSTAT) 0.4 MG SL tablet Place 1 tablet (0.4 mg total) under the tongue every 5 (five) minutes as needed for chest pain (x 3 tabs daily). 60 tablet 1   potassium chloride (KLOR-CON) 10 MEQ tablet Take 4 tablets (40 mEq total) by mouth daily. 120 tablet 11   sotalol (BETAPACE) 120 MG tablet Take 1 tablet (120 mg total) by mouth 2 (two) times daily. 180 tablet 3   TRUEPLUS LANCETS 28G MISC 1 each by Does  not apply route 3 (three) times daily. 100 each 12   torsemide (DEMADEX) 20 MG tablet Take 4 tablets (80 mg total) by mouth 2 (two) times daily.  240 tablet 11   No current facility-administered medications for this encounter.    BP 122/70   Pulse 76   Wt 93.2 kg (205 lb 6.4 oz)   SpO2 97%   BMI 27.86 kg/m   Wt Readings from Last 3 Encounters:  05/03/21 93.2 kg (205 lb 6.4 oz)  04/12/21 95.2 kg (209 lb 12.8 oz)  03/24/21 96.3 kg (212 lb 3.2 oz)   PHYSICAL EXAM: General: NAD Neck: JVP 8 cm, no thyromegaly or thyroid nodule.  Lungs: Clear to auscultation bilaterally with normal respiratory effort. CV: Nondisplaced PMI.  Heart regular S1/S2, no S3/S4, no murmur.  No peripheral edema.  No carotid bruit.  Normal pedal pulses.  Abdomen: Soft, nontender, no hepatosplenomegaly, mild distention.  Skin: Intact without lesions or rashes.  Neurologic: Alert and oriented x 3.  Psych: Normal affect. Extremities: No clubbing or cyanosis.  HEENT: Normal.   Assessment/Plan:   1. Chronic diastolic CHF: EF 37% on cardiac MRI in 6/14 but EF improved back to 55-60% on 2/16 echo.  However, EF down to 26% on Cardiolite in 9/16.  Echo in 9/20 showed EF 60-65%, moderate LVH, normal RV, mild-moderate MR.  Echo in 5/21 with EF 55%, normal RV.  He has a Cardiomems device with last reading showing PADP 31 (goal 24).  He appears mildly volume overloaded on exam, NYHA class II-III symptoms.  - Increase torsemide to 80 mg bid. BMET/BNP today and BMET in 10 days.    - I encouraged him to send Cardiomems daily.   - Continue empagliflozin.  2. Atrial fibrillation/flutter: Paroxysmal. He has had atrial flutter ablations x 2 remotely.  He is on sotalol.  QTc ok on ECG today.  He tolerates atrial arrhythmias poorly, seems to worsen CHF.  Zio patch in 4/21 with 20% atrial flutter. He saw EP, repeat ablation was not recommended. He is in NSR today, no palpitations.  - Continue sotalol, QTc acceptable today.    -  Continue Eliquis.     3. Coronary artery disease: LHC in 9/16 showed diffuse CAD, most significant involving the distal RCA.  Patient had DES x 2 to RCA.  Cath in 3/17 with patent RCA stents and diffuse distal and branch vessel disease concerning for poorly controlled diabetes.  Cath in 5/18 again showed diffuse CAD without good interventional target.  Cath in 11/20 showed severe distal and branch vessel disease but no interventional targets and not good anatomy for CABG.  He has rare atypical chest pain.   - No ASA given Eliquis use.  - Continue atorvastatin 80 mg daily, LDL ok in 8/22.  - Continue Coreg, amlodipine, and Imdur 120 mg daily for angina control.  - Avoid ranolazine with sotalol use.  4. Hyperlipidemia:  On atorvastatin, lipids ok in 8/22.  5. Hypertension: Controlled on current regimen.   6. OSA: He did a home sleep study and is waiting for CPAP.  7. CKD: BMET today.   Followup NP/PA 3 wks to reassess volume.    Loralie Champagne 05/04/2021

## 2021-05-05 ENCOUNTER — Ambulatory Visit: Payer: Medicare Other | Admitting: Dietician

## 2021-05-05 ENCOUNTER — Other Ambulatory Visit: Payer: Self-pay

## 2021-05-05 ENCOUNTER — Ambulatory Visit (INDEPENDENT_AMBULATORY_CARE_PROVIDER_SITE_OTHER): Payer: Medicare Other | Admitting: Endocrinology

## 2021-05-05 VITALS — BP 130/80 | HR 76 | Ht 72.0 in | Wt 207.6 lb

## 2021-05-05 DIAGNOSIS — E119 Type 2 diabetes mellitus without complications: Secondary | ICD-10-CM | POA: Diagnosis not present

## 2021-05-05 LAB — POCT GLYCOSYLATED HEMOGLOBIN (HGB A1C): Hemoglobin A1C: 8.7 % — AB (ref 4.0–5.6)

## 2021-05-05 NOTE — Progress Notes (Addendum)
Applied [employer] Pro to patient's right upper arm.  Lot:  [id number]  SN:  [id number]  Expiration:  10/10/2021  Scanned and confirmed that it is working after the 2 minute wait. Provided patient with the Daily log to fill out and bring back in 2 weeks when he returns. Instructed patient to place the sensor in a ziploc bag or envelope and bring to his appointment in 2 weeks in the event it comes off.  Reviewed insulin injection rotation and techniques with patient.   States that the most difficult thing is taking his medications consistently and states that he does not like pricking himself.  He requests a prescription for shorter pen needles.  Will request.  Patient to see Dr. Loanne Drilling in 2 weeks to review download of the [facility].  Antonieta Iba, RD, LDN, CDCES

## 2021-05-05 NOTE — Progress Notes (Signed)
Subjective:    Patient ID: [name] [name], male    DOB: Nov 02, 1955, 65 y.o.   MRN: [id number]  HPI Pt returns for f/u of diabetes mellitus: DM type: Insulin-requiring type 2 Dx'ed: 0923 Complications: CAD and stage 3 CRI.  Therapy: insulin since 2013, and 2 oral meds DKA: never Severe hypoglycemia: never Pancreatitis: never Pancreatic imaging: normal on 2017 CT SDOH: He lives alone. Other: he sometimes misses insulin doses, despite low copay; Interval history: no cbg record, but states cbg's vary from 127-230.  Pt says he often misses the insulin.  pt states he feels well in general.  He is retired. He did not qualify for continuous glucose monitor.   Past Medical History:  Diagnosis Date   Anginal pain ([place])    Anxiety    Arrhythmia    CAD (coronary artery disease), native coronary artery    a. Nonobstructive CAD by cath 2013 - diffuse distal and branch vessel CAD, no severe disease in the major coronaries, LV mild global hypokinesis, EF 45%. b. ETT-Sestamibi 5/14: EF 31%, small fixed inferior defect with no ischemia.   Cholecystitis    Chronic CHF ([place])    a. Mixed ICM/NICM (?EtOH). EF 35% in 2008. Echo 5/13: EF 60-65%, mod LVH, EF 45% on V gram in 12/2011. EF 12/2012: EF 50-55%, mild LVH, inferobasal HK, mild MR. ETT-Ses 5/14 EF 41%. Cardiac MRI 5/14: EF 44%, mild global HK, subepicardial delayed enhancement in nonspecific RV insertion pattern.   COLONIC POLYPS, HX OF 12/30/2006   Gout    H/O atrial flutter 2007   a. Ablations in 2007, 2008.   Heart murmur    HEPATITIS B, CHRONIC 12/30/2006   History of alcohol abuse    History of hiatal hernia    History of medication noncompliance    History of nuclear stress test    Myoview 3/17: + chest pain; EF 33%, downsloping ST depression 2, V4-6, LVH with strain, no ischemia on images; intermediate risk    HYPERCHOLESTEROLEMIA 07/11/2010   Hypertension    Left sciatic nerve pain since 04/2015   LIVER FUNCTION TESTS, ABNORMAL,  HX OF 12/30/2006   MITRAL REGURGITATION 12/30/2006   Osteochondrosarcoma ([place]) 1972   "left shoulder"   PAF (paroxysmal atrial fibrillation) ([place])    On coumadin   Rectal bleeding 12/18/2011   Scheduled for colonoscopy.     Shortness of breath dyspnea    with excertion   Sleep apnea    "suppose to send mask but they never did" (05/03/2015)   Type II diabetes mellitus [facility])     Past Surgical History:  Procedure Laterality Date   A FLUTTER ABLATION  2007, 2008   catheter ablation    CARDIAC CATHETERIZATION N/A 05/03/2015   Procedure: Right/Left Heart Cath and Coronary Angiography;  Surgeon: Larey Dresser, MD;  Location: [place] [facility];  Service: Cardiovascular;  Laterality: N/A;   CARDIAC CATHETERIZATION N/A 05/03/2015   Procedure: Coronary Stent Intervention;  Surgeon: Jettie Booze, MD;  Location: [place] [facility];  Service: Cardiovascular;  Laterality: N/A;   CARDIAC CATHETERIZATION N/A 11/04/2015   Procedure: Left Heart Cath and Coronary Angiography;  Surgeon: Larey Dresser, MD;  Location: [place] [facility];  Service: Cardiovascular;  Laterality: N/A;   CARDIOVERSION N/A 08/24/2015   Procedure: CARDIOVERSION;  Surgeon: Thayer Headings, MD;  Location: [place];  Service: Cardiovascular;  Laterality: N/A;   CHOLECYSTECTOMY N/A 02/07/2016   Procedure: LAPAROSCOPIC CHOLECYSTECTOMY WITH  INTRAOPERATIVE CHOLANGIOGRAM;  Surgeon:  Greer Pickerel, MD;  Location: [facility];  Service: General;  Laterality: N/A;   COLONOSCOPY WITH PROPOFOL N/A 04/28/2019   Procedure: COLONOSCOPY WITH PROPOFOL;  Surgeon: Ronnette Juniper, MD;  Location: [place];  Service: Gastroenterology;  Laterality: N/A;   CORONARY ANGIOPLASTY  12/05/01   ESOPHAGOGASTRODUODENOSCOPY (EGD) WITH PROPOFOL N/A 04/27/2019   Procedure: ESOPHAGOGASTRODUODENOSCOPY (EGD) WITH PROPOFOL;  Surgeon: Ronnette Juniper, MD;  Location: [place];  Service: Gastroenterology;  Laterality: N/A;   GIVENS CAPSULE STUDY N/A 06/29/2019   Procedure:  GIVENS CAPSULE STUDY;  Surgeon: Wilford Corner, MD;  Location: [place];  Service: Endoscopy;  Laterality: N/A;   LEFT HEART CATH AND CORONARY ANGIOGRAPHY N/A 12/28/2016   Procedure: Left Heart Cath and Coronary Angiography;  Surgeon: Larey Dresser, MD;  Location: [place] [facility];  Service: Cardiovascular;  Laterality: N/A;   LEFT HEART CATHETERIZATION WITH CORONARY ANGIOGRAM N/A 12/19/2011   Procedure: LEFT HEART CATHETERIZATION WITH CORONARY ANGIOGRAM;  Surgeon: Larey Dresser, MD;  Location[facility];  Service: Cardiovascular;  Laterality: N/A;   OSTEOCHONDROMA EXCISION Left 1972   "took bone tumor off my shoulder"   POLYPECTOMY  04/28/2019   Procedure: POLYPECTOMY;  Surgeon: Ronnette Juniper, MD;  Location: [facility] ENDOSCOPY;  Service: Gastroenterology;;   PRESSURE SENSOR/CARDIOMEMS N/A 02/01/2020   Procedure: PRESSURE SENSOR/CARDIOMEMS;  Surgeon: Larey Dresser, MD;  Location: [place] [facility];  Service: Cardiovascular;  Laterality: N/A;   RIGHT HEART CATH N/A 02/01/2020   Procedure: RIGHT HEART CATH;  Surgeon: Larey Dresser, MD;  Location: [place] [facility];  Service: Cardiovascular;  Laterality: N/A;   RIGHT/LEFT HEART CATH AND CORONARY ANGIOGRAPHY N/A 07/01/2019   Procedure: RIGHT/LEFT HEART CATH AND CORONARY ANGIOGRAPHY;  Surgeon: Larey Dresser, MD;  Location: [place] [facility];  Service: Cardiovascular;  Laterality: N/A;    Social History   Socioeconomic History   Marital status: Widowed    Spouse name: Not on file   Number of children: 2   Years of education: 14   Highest education level: Some college, no degree  Occupational History    Employer: UNEMPLOYED  Tobacco Use   Smoking status: Former    Packs/day: 4.00    Years: 18.00    Pack years: 72.00    Types: Cigarettes    Quit date: 08/14/1983    Years since quitting: 37.7   Smokeless tobacco: Never  Vaping Use   Vaping Use: Never used  Substance and Sexual Activity   Alcohol use: Yes    Alcohol/week:  16.0 standard drinks    Types: 6 Cans of beer, 10 Shots of liquor per week    Comment: occasionally   Drug use: Yes    Types: Marijuana    Comment: twice a week   Sexual activity: Never  Other Topics Concern   Not on file  Social History Narrative   Not on file   Social Determinants of Health   Financial Resource Strain: Low Risk    Difficulty of Paying Living Expenses: Not hard at all  Food Insecurity: No Food Insecurity   Worried About Charity fundraiser in the Last Year: Never true   Ran Out of Food in the Last Year: Never true  Transportation Needs: No Transportation Needs   Lack of Transportation (Medical): No   Lack of Transportation (Non-Medical): No  Physical Activity: Inactive   Days of Exercise per Week: 0 days   Minutes of Exercise per Session: 0 min  Stress: No Stress Concern Present  Feeling of Stress : Not at all  Social Connections: Moderately Isolated   Frequency of Communication with Friends and Family: More than three times a week   Frequency of Social Gatherings with Friends and Family: More than three times a week   Attends Religious Services: More than 4 times per year   Active Member of [employer] or Organizations: No   Attends Archivist Meetings: Never   Marital Status: Widowed  Human resources officer Violence: Not At Risk   Fear of Current or Ex-Partner: No   Emotionally Abused: No   Physically Abused: No   Sexually Abused: No    Current Outpatient Medications on File Prior to Visit  Medication Sig Dispense Refill   ACCU-CHEK SOFTCLIX LANCETS lancets Use as instructed for three times daily testing of blood glucose 100 each 12   acetaminophen (TYLENOL) 500 MG tablet Take 1,000 mg by mouth every 6 (six) hours as needed (pain).     albuterol (VENTOLIN HFA) 108 (90 Base) MCG/ACT inhaler Inhale 1-2 puffs into the lungs every 6 (six) hours as needed for wheezing or shortness of breath. 8 g 0   amLODipine (NORVASC) 5 MG tablet Take 1 tablet by mouth  once daily 90 tablet 1   apixaban (ELIQUIS) 5 MG TABS tablet Take 5 mg by mouth 2 (two) times daily.     atorvastatin (LIPITOR) 80 MG tablet Take 1 tablet (80 mg total) by mouth daily. 90 tablet 3   blood glucose meter kit and supplies KIT Dispense based on patient and insurance preference. Use up to four times daily as directed. (FOR ICD-9 250.00, 250.01). 1 each 0   Blood Glucose Monitoring Suppl (ACCU-CHEK AVIVA PLUS) w/Device KIT Use as directed for 3 times daily testing of blood glucose 1 kit 0   carvedilol (COREG) 25 MG tablet TAKE 1 TABLET BY MOUTH TWICE DAILY WITH A MEAL 180 tablet 3   colchicine 0.6 MG tablet On day 1 take 2 tablets at first sign of gout flare.  Take 1 tablet 1 hour later.  Then take 1 tablet daily starting the next day until resolution of gout flare. 10 tablet 1   empagliflozin (JARDIANCE) 10 MG TABS tablet Take 1 tablet (10 mg total) by mouth daily before breakfast. 30 tablet 11   glucose blood (ACCU-CHEK AVIVA PLUS) test strip Use as instructed for 3 times daily testing of blood glucose 100 each 12   guaiFENesin (MUCINEX PO) Take 1 tablet by mouth 2 (two) times daily as needed (cough/congestion).      hydrALAZINE (APRESOLINE) 50 MG tablet TAKE 1 & 1/2 (ONE & ONE-HALF) TABLETS BY MOUTH TWICE DAILY. MUST MAKE FOLLOW UP APPT FOR FURTHER REFILLS 90 tablet 5   insulin glargine (LANTUS SOLOSTAR) 100 UNIT/ML Solostar Pen Inject 40 Units into the skin every morning. And pen needles 1/day 45 mL 3   isosorbide mononitrate (IMDUR) 120 MG 24 hr tablet Take 1 tablet by mouth once daily 30 tablet 6   Lancet Devices (ACCU-CHEK SOFTCLIX) lancets Use as instructed for 3 times daily testing of blood glucose 1 each 0   meclizine (ANTIVERT) 12.5 MG tablet Take 1 tablet (12.5 mg total) by mouth 3 (three) times daily as needed for dizziness. 12 tablet 0   metFORMIN (GLUCOPHAGE) 1000 MG tablet Take 1 tablet (1,000 mg total) by mouth daily with breakfast. TAKE 1 TABLET BY MOUTH TWICE DAILY WITH A  MEAL 60 tablet 3   nitroGLYCERIN (NITROSTAT) 0.4 MG SL tablet Place 1 tablet (0.4  mg total) under the tongue every 5 (five) minutes as needed for chest pain (x 3 tabs daily). 60 tablet 1   potassium chloride (KLOR-CON) 10 MEQ tablet Take 4 tablets (40 mEq total) by mouth daily. 120 tablet 11   sotalol (BETAPACE) 120 MG tablet Take 1 tablet (120 mg total) by mouth 2 (two) times daily. 180 tablet 3   torsemide (DEMADEX) 20 MG tablet Take 4 tablets (80 mg total) by mouth 2 (two) times daily. 240 tablet 11   TRUEPLUS LANCETS 28G MISC 1 each by Does not apply route 3 (three) times daily. 100 each 12   No current facility-administered medications on file prior to visit.      Family History  Problem Relation Age of Onset   Diabetes Mother    Hypertension Mother    Heart attack Neg Hx    Stroke Neg Hx     BP 130/80 (BP Location: Right Arm, Patient Position: Sitting, Cuff Size: Normal)   Pulse 76   Ht 6' (1.829 m)   Wt 207 lb 9.6 oz (94.2 kg)   SpO2 97%   BMI 28.16 kg/m   Review of Systems     Objective:   Physical Exam Pulses: dorsalis pedis intact bilat.   MSK: no deformity of the feet CV: no leg edema Skin:  no ulcer on the feet.  normal color and temp on the feet. Neuro: sensation is intact to touch on the feet.     A1c=8.7%    Assessment & Plan:  Insulin-requiring type 2 DM: uncontrolled Noncompliance with insulin. He declines to change to [place]  Patient Instructions  check your blood sugar twice a day.  vary the time of day when you check, between before the 3 meals, and at bedtime.  also check if you have symptoms of your blood sugar being too high or too low.  please keep a record of the readings and bring it to your next appointment here (or you can bring the meter itself).  You can write it on any piece of paper.  please call us sooner if your blood sugar goes below 70, or if most of your readings are over 200. We are placing a continuous glucose monitor today.    We will need to take this complex situation in stages.  Please continue the same insulin and 2 diabetes pills.   Try putting the insulin pen on your kitchen table, to help you remember it each morning.   Please come back for a follow-up appointment in 2 weeks.

## 2021-05-05 NOTE — Patient Instructions (Addendum)
check your blood sugar twice a day.  vary the time of day when you check, between before the 3 meals, and at bedtime.  also check if you have symptoms of your blood sugar being too high or too low.  please keep a record of the readings and bring it to your next appointment here (or you can bring the meter itself).  You can write it on any piece of paper.  please call us sooner if your blood sugar goes below 70, or if most of your readings are over 200. We are placing a continuous glucose monitor today.   We will need to take this complex situation in stages.  Please continue the same insulin and 2 diabetes pills.   Try putting the insulin pen on your kitchen table, to help you remember it each morning.   Please come back for a follow-up appointment in 2 weeks.

## 2021-05-07 ENCOUNTER — Other Ambulatory Visit: Payer: Self-pay | Admitting: Endocrinology

## 2021-05-07 MED ORDER — PEN NEEDLES 32G X 4 MM MISC: 1.0000 | Freq: Every day | 3 refills | Status: DC

## 2021-05-11 ENCOUNTER — Telehealth: Payer: Self-pay

## 2021-05-11 NOTE — Telephone Encounter (Signed)
   Telephone encounter was:  Successful.  05/11/2021 Name: [name] [name] MRN: [id number] DOB: 10/08/1955  [name] [name] is a 65 y.o. year old male who is a primary care patient of Volanda Napoleon, Langley Adie, MD . The community resource team was consulted for assistance with Home Modifications  Care guide performed the following interventions:  Patient advised he has received the resources I have sent over ([place], [place] [employer], [place] and [facility]) and does not have any further questions or concerns at this time.  Follow Up Plan:  No further follow up planned at this time. The patient has been provided with needed resources.  [place]  [place], [place]  Main Phone: [phone]  E-mail: [email or web address]  Website: [email or web address]

## 2021-05-12 DIAGNOSIS — I5032 Chronic diastolic (congestive) heart failure: Secondary | ICD-10-CM

## 2021-05-12 DIAGNOSIS — I10 Essential (primary) hypertension: Secondary | ICD-10-CM

## 2021-05-12 DIAGNOSIS — E1165 Type 2 diabetes mellitus with hyperglycemia: Secondary | ICD-10-CM | POA: Diagnosis not present

## 2021-05-12 DIAGNOSIS — E7800 Pure hypercholesterolemia, unspecified: Secondary | ICD-10-CM

## 2021-05-12 DIAGNOSIS — N1831 Chronic kidney disease, stage 3a: Secondary | ICD-10-CM

## 2021-05-12 DIAGNOSIS — I4891 Unspecified atrial fibrillation: Secondary | ICD-10-CM

## 2021-05-15 ENCOUNTER — Other Ambulatory Visit (HOSPITAL_COMMUNITY): Payer: Medicare Other

## 2021-05-19 ENCOUNTER — Ambulatory Visit (INDEPENDENT_AMBULATORY_CARE_PROVIDER_SITE_OTHER): Payer: Medicare Other | Admitting: Endocrinology

## 2021-05-19 ENCOUNTER — Other Ambulatory Visit: Payer: Self-pay

## 2021-05-19 VITALS — BP 114/60 | HR 79 | Ht 72.0 in | Wt 212.0 lb

## 2021-05-19 DIAGNOSIS — E1169 Type 2 diabetes mellitus with other specified complication: Secondary | ICD-10-CM | POA: Diagnosis not present

## 2021-05-19 DIAGNOSIS — Z794 Long term (current) use of insulin: Secondary | ICD-10-CM

## 2021-05-19 NOTE — Progress Notes (Signed)
Subjective:    Patient ID: [name] [name], male    DOB: [birth year], 65 y.o.   MRN: [id number]  HPI Pt returns for f/u of diabetes mellitus: DM type: Insulin-requiring type 2 Dx'ed: 4193 Complications: CAD and stage 3 CRI.  Therapy: insulin since 2013, and 2 oral meds.   DKA: never Severe hypoglycemia: never Pancreatitis: never Pancreatic imaging: normal on 2017 CT SDOH: He lives alone; He is retired. He did not qualify for continuous glucose monitor.   Other: he sometimes misses insulin doses, despite low copay.   Interval history:  Pt says he misses the insulin 2-3 x/week.  He says this is because he forgets.  pt states he feels well in general.  continuous glucose monitor data are reviewed.  Glucose varies from 130-320.  It is in general lowest at [facility]; it is highest at [facility] and 11PM Past Medical History:  Diagnosis Date   Anginal pain ([place])    Anxiety    Arrhythmia    CAD (coronary artery disease), native coronary artery    a. Nonobstructive CAD by cath 2013 - diffuse distal and branch vessel CAD, no severe disease in the major coronaries, LV mild global hypokinesis, EF 45%. b. ETT-Sestamibi 5/14: EF 31%, small fixed inferior defect with no ischemia.   Cholecystitis    Chronic CHF ([place])    a. Mixed ICM/NICM (?EtOH). EF 35% in 2008. Echo 5/13: EF 60-65%, mod LVH, EF 45% on V gram in 12/2011. EF 12/2012: EF 50-55%, mild LVH, inferobasal HK, mild MR. ETT-Ses 5/14 EF 41%. Cardiac MRI 5/14: EF 44%, mild global HK, subepicardial delayed enhancement in nonspecific RV insertion pattern.   COLONIC POLYPS, HX OF 12/30/2006   Gout    H/O atrial flutter 2007   a. Ablations in 2007, 2008.   Heart murmur    HEPATITIS B, CHRONIC 12/30/2006   History of alcohol abuse    History of hiatal hernia    History of medication noncompliance    History of nuclear stress test    Myoview 3/17: + chest pain; EF 33%, downsloping ST depression 2, V4-6, LVH with strain, no ischemia on images;  intermediate risk    HYPERCHOLESTEROLEMIA 07/11/2010   Hypertension    Left sciatic nerve pain since 04/2015   LIVER FUNCTION TESTS, ABNORMAL, HX OF 12/30/2006   MITRAL REGURGITATION 12/30/2006   Osteochondrosarcoma ([place]) 1972   "left shoulder"   PAF (paroxysmal atrial fibrillation) ([place])    On coumadin   Rectal bleeding 12/18/2011   Scheduled for colonoscopy.     Shortness of breath dyspnea    with excertion   Sleep apnea    "suppose to send mask but they never did" (05/03/2015)   Type II diabetes mellitus [facility])     Past Surgical History:  Procedure Laterality Date   A FLUTTER ABLATION  2007, 2008   catheter ablation    CARDIAC CATHETERIZATION N/A 05/03/2015   Procedure: Right/Left Heart Cath and Coronary Angiography;  Surgeon: Larey Dresser, MD;  Location: [place] [facility];  Service: Cardiovascular;  Laterality: N/A;   CARDIAC CATHETERIZATION N/A 05/03/2015   Procedure: Coronary Stent Intervention;  Surgeon: Jettie Booze, MD;  Location: [place] [facility];  Service: Cardiovascular;  Laterality: N/A;   CARDIAC CATHETERIZATION N/A 11/04/2015   Procedure: Left Heart Cath and Coronary Angiography;  Surgeon: Larey Dresser, MD;  Location: [place] [facility];  Service: Cardiovascular;  Laterality: N/A;   CARDIOVERSION N/A 08/24/2015   Procedure: CARDIOVERSION;  Surgeon:  Philip J Nahser, MD;  Location: [facility];  Service: Cardiovascular;  Laterality: N/A;   CHOLECYSTECTOMY N/A 02/07/2016   Procedure: LAPAROSCOPIC CHOLECYSTECTOMY WITH  INTRAOPERATIVE CHOLANGIOGRAM;  Surgeon: Eric Wilson, MD;  Location: [facility];  Service: General;  Laterality: N/A;   COLONOSCOPY WITH PROPOFOL N/A 04/28/2019   Procedure: COLONOSCOPY WITH PROPOFOL;  Surgeon: Karki, Arya, MD;  Location: [facility];  Service: Gastroenterology;  Laterality: N/A;   CORONARY ANGIOPLASTY  12/05/01   ESOPHAGOGASTRODUODENOSCOPY (EGD) WITH PROPOFOL N/A 04/27/2019   Procedure: ESOPHAGOGASTRODUODENOSCOPY (EGD) WITH PROPOFOL;   Surgeon: Karki, Arya, MD;  Location: [facility];  Service: Gastroenterology;  Laterality: N/A;   GIVENS CAPSULE STUDY N/A 06/29/2019   Procedure: GIVENS CAPSULE STUDY;  Surgeon: Schooler, Vincent, MD;  [facility]: [facility];  Service: Endoscopy;  Laterality: N/A;   LEFT HEART CATH AND CORONARY ANGIOGRAPHY N/A 12/28/2016   Procedure: Left Heart Cath and Coronary Angiography;  Surgeon: McLean, Dalton S, MD;  [facility];  Service: Cardiovascular;  Laterality: N/A;   LEFT HEART CATHETERIZATION WITH CORONARY ANGIOGRAM N/A 12/19/2011   Procedure: LEFT HEART CATHETERIZATION WITH CORONARY ANGIOGRAM;  Surgeon: Dalton S McLean, MD;  [facility];  Service: Cardiovascular;  Laterality: N/A;   OSTEOCHONDROMA EXCISION Left 1972   "took bone tumor off my shoulder"   POLYPECTOMY  04/28/2019   Procedure: POLYPECTOMY;  Surgeon: Karki, Arya, MD;  [facility]: [facility];  Service: Gastroenterology;;   PRESSURE SENSOR/CARDIOMEMS N/A 02/01/2020   Procedure: PRESSURE SENSOR/CARDIOMEMS;  Surgeon: McLean, Dalton S, MD;  [facility];  Service: Cardiovascular;  Laterality: N/A;   RIGHT HEART CATH N/A 02/01/2020   Procedure: RIGHT HEART CATH;  Surgeon: McLean, Dalton S, MD;  [facility];  Service: Cardiovascular;  Laterality: N/A;   RIGHT/LEFT HEART CATH AND CORONARY ANGIOGRAPHY N/A 07/01/2019   Procedure: RIGHT/LEFT HEART CATH AND CORONARY ANGIOGRAPHY;  Surgeon: McLean, Dalton S, MD;  [facility];  Service: Cardiovascular;  Laterality: N/A;    Social History   Socioeconomic History   Marital status: Widowed    Spouse name: Not on file   Number of children: 2   Years of education: 14   Highest education level: Some college, no degree  Occupational History    Employer: UNEMPLOYED  Tobacco Use   Smoking status: Former    Packs/day: 4.00    Years: 18.00    Pack years: 72.00    Types: Cigarettes    Quit date: 08/14/1983    Years since  quitting: 37.7   Smokeless tobacco: Never  Vaping Use   Vaping Use: Never used  Substance and Sexual Activity   Alcohol use: Yes    Alcohol/week: 16.0 standard drinks    Types: 6 Cans of beer, 10 Shots of liquor per week    Comment: occasionally   Drug use: Yes    Types: Marijuana    Comment: twice a week   Sexual activity: Never  Other Topics Concern   Not on file  Social History Narrative   Not on file   Social Determinants of Health   Financial Resource Strain: Low Risk    Difficulty of Paying Living Expenses: Not hard at all  Food Insecurity: No Food Insecurity   Worried About Running Out of Food in the Last Year: Never true   Ran Out of Food in the Last Year: Never true  Transportation Needs: No Transportation Needs   Lack of Transportation (Medical): No   Lack of Transportation (  Non-Medical): No  Physical Activity: Inactive   Days of Exercise per Week: 0 days   Minutes of Exercise per Session: 0 min  Stress: No Stress Concern Present   Feeling of Stress : Not at all  Social Connections: Moderately Isolated   Frequency of Communication with Friends and Family: More than three times a week   Frequency of Social Gatherings with Friends and Family: More than three times a week   Attends Religious Services: More than 4 times per year   Active Member of Clubs or Organizations: No   Attends Club or Organization Meetings: Never   Marital Status: Widowed  Intimate Partner Violence: Not At Risk   Fear of Current or Ex-Partner: No   Emotionally Abused: No   Physically Abused: No   Sexually Abused: No    Current Outpatient Medications on File Prior to Visit  Medication Sig Dispense Refill   ACCU-CHEK SOFTCLIX LANCETS lancets Use as instructed for three times daily testing of blood glucose 100 each 12   acetaminophen (TYLENOL) 500 MG tablet Take 1,000 mg by mouth every 6 (six) hours as needed (pain).     albuterol (VENTOLIN HFA) 108 (90 Base) MCG/ACT inhaler Inhale 1-2  puffs into the lungs every 6 (six) hours as needed for wheezing or shortness of breath. 8 g 0   amLODipine (NORVASC) 5 MG tablet Take 1 tablet by mouth once daily 90 tablet 1   apixaban (ELIQUIS) 5 MG TABS tablet Take 5 mg by mouth 2 (two) times daily.     atorvastatin (LIPITOR) 80 MG tablet Take 1 tablet (80 mg total) by mouth daily. 90 tablet 3   blood glucose meter kit and supplies KIT Dispense based on patient and insurance preference. Use up to four times daily as directed. (FOR ICD-9 250.00, 250.01). 1 each 0   Blood Glucose Monitoring Suppl (ACCU-CHEK AVIVA PLUS) w/Device KIT Use as directed for 3 times daily testing of blood glucose 1 kit 0   carvedilol (COREG) 25 MG tablet TAKE 1 TABLET BY MOUTH TWICE DAILY WITH A MEAL 180 tablet 3   colchicine 0.6 MG tablet On day 1 take 2 tablets at first sign of gout flare.  Take 1 tablet 1 hour later.  Then take 1 tablet daily starting the next day until resolution of gout flare. 10 tablet 1   empagliflozin (JARDIANCE) 10 MG TABS tablet Take 1 tablet (10 mg total) by mouth daily before breakfast. 30 tablet 11   glucose blood (ACCU-CHEK AVIVA PLUS) test strip Use as instructed for 3 times daily testing of blood glucose 100 each 12   guaiFENesin (MUCINEX PO) Take 1 tablet by mouth 2 (two) times daily as needed (cough/congestion).      hydrALAZINE (APRESOLINE) 50 MG tablet TAKE 1 & 1/2 (ONE & ONE-HALF) TABLETS BY MOUTH TWICE DAILY. MUST MAKE FOLLOW UP APPT FOR FURTHER REFILLS 90 tablet 5   insulin glargine (LANTUS SOLOSTAR) 100 UNIT/ML Solostar Pen Inject 40 Units into the skin every morning. And pen needles 1/day 45 mL 3   Insulin Pen Needle (PEN NEEDLES) 32G X 4 MM MISC 1 each by Does not apply route daily. 100 each 3   isosorbide mononitrate (IMDUR) 120 MG 24 hr tablet Take 1 tablet by mouth once daily 30 tablet 6   Lancet Devices (ACCU-CHEK SOFTCLIX) lancets Use as instructed for 3 times daily testing of blood glucose 1 each 0   meclizine (ANTIVERT)  12.5 MG tablet Take 1 tablet (12.5 mg total) by   mouth 3 (three) times daily as needed for dizziness. 12 tablet 0   metFORMIN (GLUCOPHAGE) 1000 MG tablet Take 1 tablet (1,000 mg total) by mouth daily with breakfast. TAKE 1 TABLET BY MOUTH TWICE DAILY WITH A MEAL 60 tablet 3   nitroGLYCERIN (NITROSTAT) 0.4 MG SL tablet Place 1 tablet (0.4 mg total) under the tongue every 5 (five) minutes as needed for chest pain (x 3 tabs daily). 60 tablet 1   potassium chloride (KLOR-CON) 10 MEQ tablet Take 4 tablets (40 mEq total) by mouth daily. 120 tablet 11   sotalol (BETAPACE) 120 MG tablet Take 1 tablet (120 mg total) by mouth 2 (two) times daily. 180 tablet 3   torsemide (DEMADEX) 20 MG tablet Take 4 tablets (80 mg total) by mouth 2 (two) times daily. 240 tablet 11   TRUEPLUS LANCETS 28G MISC 1 each by Does not apply route 3 (three) times daily. 100 each 12   No current facility-administered medications on file prior to visit.     Family History  Problem Relation Age of Onset   Diabetes Mother    Hypertension Mother    Heart attack Neg Hx    Stroke Neg Hx     BP 114/60 (BP Location: Right Arm, Patient Position: Sitting, Cuff Size: Large)   Pulse 79   Ht 6' (1.829 m)   Wt 212 lb (96.2 kg)   SpO2 96%   BMI 28.75 kg/m   Review of Systems     Objective:   Physical Exam   Lab Results  Component Value Date   HGBA1C 8.7 (A) 05/05/2021      Assessment & Plan:  Insulin-requiring type 2 DM: uncontrolled Noncompliance with insulin.  We discussed options such as [place] and/or GLP.  Instead, pt chooses to redouble efforts to not miss Lantus. He also declines to increase lantus.    Patient Instructions  check your blood sugar twice a day.  vary the time of day when you check, between before the 3 meals, and at bedtime.  also check if you have symptoms of your blood sugar being too high or too low.  please keep a record of the readings and bring it to your next appointment here (or you can bring  the meter itself).  You can write it on any piece of paper.  please call us sooner if your blood sugar goes below 70, or if most of your readings are over 200. We will need to take this complex situation in stages.  Please continue the same insulin and 2 diabetes pills.   Try putting the insulin pen on your kitchen table, to help you remember it each morning.   Please come back for a follow-up appointment in 2 months.

## 2021-05-19 NOTE — Patient Instructions (Addendum)
check your blood sugar twice a day.  vary the time of day when you check, between before the 3 meals, and at bedtime.  also check if you have symptoms of your blood sugar being too high or too low.  please keep a record of the readings and bring it to your next appointment here (or you can bring the meter itself).  You can write it on any piece of paper.  please call us sooner if your blood sugar goes below 70, or if most of your readings are over 200. We will need to take this complex situation in stages.  Please continue the same insulin and 2 diabetes pills.   Try putting the insulin pen on your kitchen table, to help you remember it each morning.   Please come back for a follow-up appointment in 2 months.

## 2021-05-23 ENCOUNTER — Telehealth (HOSPITAL_COMMUNITY): Payer: Self-pay

## 2021-05-23 NOTE — Telephone Encounter (Signed)
Called to confirm/remind patient of their appointment at the [facility] on 05/24/21.   Patient reminded to bring all medications and/or complete list.  Confirmed patient has transportation. Gave directions, instructed to utilize [facility] parking.  Confirmed appointment prior to ending call.

## 2021-05-23 NOTE — Progress Notes (Signed)
Patient ID: [name] [name], male   DOB: 10-21-1955, 65 y.o.   MRN: [id number] PCP: [name], MD Cardiology: Dr. Aundra Dubin  65 y.o. with history of HTN, diabetes, paroxysmal atrial fibrillation, and CAD presents for cardiology followup.  He was admitted in 5/13 with chest pain with exertion.  LHC was done showing global HK with EF 45% and diffuse, severe distal and branch vessel disease.  There was no interventional option, but it was suspect that this disease could be causing his anginal-type pain.  Echo at that time was read as showing EF 60-65%.  He was admitted in 5/14 with hypertensive urgency and chest pain.  He ruled out for MI and BP was controlled.  His EF was 50-55% by echo.  ETT-Sestamibi done as outpatient showed small fixed inferior defect with no ischemia but EF was 31%.  He was quite hypertensive during the study.  Cardiac MRI done to confirm EF in 5/14 showed EF 44%.  He had an episode of atrial fibrillation/RVR in 3/15 but went back into NSR.  Echo in 2/16 showed EF 55-60% with moderate diastolic dysfunction.   He had a Cardiolite in 9/16 showing inferior ischemia.  He was having exertional dyspnea with less activity than normal.  There was concern for worsened CAD and he was sent for [facility] in 9/16. By RHC, filling pressures were not significantly elevated. He had a severe distal RCA stenosis that was treated with DES x 2.  Echo showed EF 55-60%.    In 10/16, he was admitted with acute cholecystitis.  He received a cholecystostomy tube.  Cholecystectomy done in 6/17.   Atrial fibrillation in 1/17, cardioverted to NSR.   Recurrent concerning chest pain in 3/17.  Repeat LHC showed diffuse distal and branch vessel disease consistent with poorly-controlled DM, but patent RCA stents.    He was admitted with atrial fibrillation in 12/17.  Started on sotalol and converted to NSR.    He had recurrent chest pain in 5/18 with abnormal Cardiolite.  Cath was done again in 5/18,  showing diffuse CAD with no good interventional options.   Admitted 11/20 with increased dyspnea and exertional chest pain. Hgb went down to 8.1. GI consulted.  Had DeWitt that showed severe distal and branch vessel disease but no interventional targets and not good anatomy for CABG. Continued on medical management. Capsule endoscopy without recent or active bleeding, no AVMs. He went into atrial flutter during his hospitalization. He went back into NSR, however, after discharge.  +Snoring and daytime sleepiness.   He has had episodes of syncope associated with vigorous coughing.   He was in the ER with atrial flutter and dyspnea in 4/21, in NSR today on sotalol.    Today he returns for HF follow up. Last visit torsemide increased to 80 mg bid for volume overload. Overall feeling fine. He has dyspnea walking on flat ground for longer distances. Denies CP, dizziness, edema. He has chronic orthopnea. Appetite ok. No fever or chills. Weight at home 210-216 pounds. He has missed a few doses of torsemide, and only takes 2 potassium tablets daily instead of 4. He has been taking his colchicine daily for about a week for his gout flare.  ECG (personally reviewed): none ordered today.  Cardiomems: PADP 28 mmHg (goal 24)  ReDs: 44%  Labs (5/13): K 3.6, creatinine 0.93, LDL 85, HDL 57 Labs (7/13): K 3.9, creatinine 1.1, LDL 57, HDL 51  Labs (5/14): K 4, creatinine 1.25 Labs (8/14): K 3.7,  creatinine 0.9, LDL 103, HDL 42 Labs (8/15): K 3.5 creatinine 1.0, BNP 75 Labs (2/16): K 3.8, creatinine 1.1 Labs (9/16): K 3.5, creatinine 1.03, BNP 59, HDL 40, LDL 111, TGs 333 Labs (11/16): K 3.6, creaitnine 1.07, LFTs normal, LDL 44 Labs (6/17): K 3.4, creatinine 1.52 => 1.13 Labs (1/18): K 3.9, creatinine 1.25 Labs (6/18): K 3.8, creatinine 1.21, HCT 39.7 Labs (8/18): LDL 39, HDL 43 Labs (12/18): 4.4, creatinine 1.25, LFTs normal Labs (8/19): K 3.6, creatinine 1.13, LDL 68, HDL 49  Labs (11/20): K 3.5,  creatinine 1.33, LDL 25 Labs (4/21): K 4.0, SCr 1.14, BNP 266  Labs (6/21): K 3.7, creatinine 1.25 Labs (12/21): LDL 25 Labs (1/22): BNP 132 Labs (2/22): K 3.4, creatinine 1.44 Labs (8/22): LDL 81 Labs (9/22): K 3.2, creatinine 1.53  PMH: 1. DM2 2. Gout 3. HTN: Cough with ACEI.  4. Cardiomyopathy: Suspect mixed ischemic/nonischemic (?ETOH-related).  EF 35% in 2008.  Echo in 5/13 showed EF 60-65% with moderate LVH but EF was 45% on LV-gram in 5/13.  Echo (5/14) with EF 50-55%, mild LVH, inferobasal HK, mild MR.  ETT-Sestamibi in 5/14, however, showed EF 31%.  Cardiac MRI (6/14) with EF 44%, mild global hypokinesis, subepicardial delayed enhancement in a nonspecific RV insertion site pattern.  Echo (2/16) with EF 55-60%, moderate diastolic dysfunction. RHC (9/16) with mean RA 8, PA 39/20 mean 28, mean PCWP 14, CI 2.2. Echo (9/16) with EF 55-60%, grade II diastolic dysfunction.  - Echo (6/17) with EF 55-60%, moderate LVH, grade II diastolic dysfunction, mild AI, mild MR.  - RHC (11/20): mean RA 4, PA 46/14 mean 26, mean PCWP 13, CI 3.23 - Echo (9/20): EF 60-65%, moderate LVH, normal RV size and systolic function, mild-moderate MR, mild AI.  - Echo (5/21): EF 55%, RV normal - Has Cardiomems.  5. CAD: LHC in 2010 with mild nonobstructive disease.  LHC (5/13) with diffuse distal and branch vessel disease, mild global hypokinesis and EF 45%.  ETT-Sestamibi (5/14): EF 31%, small fixed inferior defect with no ischemia.  Lexiscan Cardiolite (9/16) with EF 26%, moderate inferior defect that was partially reversible, suggesting ischemia => High risk study. LHC (9/16) with 40-50% mLAD, diffuse up to 50% distal LAD, 90% ostium of branch off ramus, 95% dRCA => DES to RCA x 2, EF 55-60%.   - LHC (3/17) with diffuse branch and distal vessel disease c/w poorly controlled DM, RCA stents patent.  - LHC (5/18): Diffuse CAD with no good interventional option. 60% mRCA, 90% dLAD, 50% serial mid ramus stenoses, ramus  branches with ostial 80-90% stenoses. - LHC (11/20): Severe distal and branch vessel disease but no interventional targets and not good anatomy for CABG  6. H/o chronic HBV 7. Osteochondroma left shoulder.  8. Hyperlipidemia 9. Paroxysmal atrial fibrillation: Coumadin.  Developed cough and increased ESR with amiodarone.  DCCV in 1/17.  Recurrent atrial fibrillation in 12/17, sotalol started.  10. Atrial flutter: had ablations in 2007 and 2008.  - Zio patch in 4/21 with 20% atrial flutter.  11. OSA on CPAP.  12. Acute cholecystitis (10/16): Cholecystostomy tube placed.  Cholecystectomy 6/17.  13. CKD 14. Anemia - Suspected GI bleed but EGD/c-scope 9/20 without significant findings.  - Capsule Endoscopy (11/20) without recent or active bleeding, no AVMs.  15. Syncope: Suspected cough syncope.  16. OSA  SH: Married, prior smoker.  Some ETOH, occasionally heavy in past.  Does use occasional marijuana. Out of work Engineer, drilling. 2 daughters in grad school.  FH: CAD  ROS: All systems reviewed and negative except as per HPI.   Current Outpatient Medications  Medication Sig Dispense Refill   ACCU-CHEK SOFTCLIX LANCETS lancets Use as instructed for three times daily testing of blood glucose 100 each 12   acetaminophen (TYLENOL) 500 MG tablet Take 1,000 mg by mouth every 6 (six) hours as needed (pain).     albuterol (VENTOLIN HFA) 108 (90 Base) MCG/ACT inhaler Inhale 1-2 puffs into the lungs every 6 (six) hours as needed for wheezing or shortness of breath. 8 g 0   amLODipine (NORVASC) 5 MG tablet Take 1 tablet by mouth once daily 90 tablet 1   apixaban (ELIQUIS) 5 MG TABS tablet Take 5 mg by mouth 2 (two) times daily.     atorvastatin (LIPITOR) 80 MG tablet Take 1 tablet (80 mg total) by mouth daily. 90 tablet 3   blood glucose meter kit and supplies KIT Dispense based on patient and insurance preference. Use up to four times daily as directed. (FOR ICD-9 250.00, 250.01). 1 each 0   Blood  Glucose Monitoring Suppl (ACCU-CHEK AVIVA PLUS) w/Device KIT Use as directed for 3 times daily testing of blood glucose 1 kit 0   carvedilol (COREG) 25 MG tablet TAKE 1 TABLET BY MOUTH TWICE DAILY WITH A MEAL 180 tablet 3   empagliflozin (JARDIANCE) 10 MG TABS tablet Take 1 tablet (10 mg total) by mouth daily before breakfast. 30 tablet 11   glucose blood (ACCU-CHEK AVIVA PLUS) test strip Use as instructed for 3 times daily testing of blood glucose 100 each 12   guaiFENesin (MUCINEX PO) Take 1 tablet by mouth 2 (two) times daily as needed (cough/congestion).      hydrALAZINE (APRESOLINE) 50 MG tablet TAKE 1 & 1/2 (ONE & ONE-HALF) TABLETS BY MOUTH TWICE DAILY. MUST MAKE FOLLOW UP APPT FOR FURTHER REFILLS 90 tablet 5   insulin glargine (LANTUS SOLOSTAR) 100 UNIT/ML Solostar Pen Inject 40 Units into the skin every morning. And pen needles 1/day 45 mL 3   Insulin Pen Needle (PEN NEEDLES) 32G X 4 MM MISC 1 each by Does not apply route daily. 100 each 3   isosorbide mononitrate (IMDUR) 120 MG 24 hr tablet Take 1 tablet by mouth once daily 30 tablet 6   Lancet Devices (ACCU-CHEK SOFTCLIX) lancets Use as instructed for 3 times daily testing of blood glucose 1 each 0   meclizine (ANTIVERT) 12.5 MG tablet Take 1 tablet (12.5 mg total) by mouth 3 (three) times daily as needed for dizziness. 12 tablet 0   metFORMIN (GLUCOPHAGE) 1000 MG tablet Take 1 tablet (1,000 mg total) by mouth daily with breakfast. TAKE 1 TABLET BY MOUTH TWICE DAILY WITH A MEAL 60 tablet 3   potassium chloride (KLOR-CON) 10 MEQ tablet Take 4 tablets (40 mEq total) by mouth daily. 120 tablet 11   sotalol (BETAPACE) 120 MG tablet Take 1 tablet (120 mg total) by mouth 2 (two) times daily. 180 tablet 3   torsemide (DEMADEX) 20 MG tablet Take 4 tablets (80 mg total) by mouth 2 (two) times daily. 240 tablet 11   TRUEPLUS LANCETS 28G MISC 1 each by Does not apply route 3 (three) times daily. 100 each 12   colchicine 0.6 MG tablet On day 1 take 2  tablets at first sign of gout flare.  Take 1 tablet 1 hour later.  Then take 1 tablet daily starting the next day until resolution of gout flare. (Patient not taking: Reported on 05/24/2021) 10  tablet 1   nitroGLYCERIN (NITROSTAT) 0.4 MG SL tablet Place 1 tablet (0.4 mg total) under the tongue every 5 (five) minutes as needed for chest pain (x 3 tabs daily). (Patient not taking: Reported on 05/24/2021) 60 tablet 1   No current facility-administered medications for this encounter.   BP (!) 147/90   Pulse 79   Wt 95.7 kg (211 lb)   SpO2 97%   BMI 28.62 kg/m    Wt Readings from Last 3 Encounters:  05/24/21 95.7 kg (211 lb)  05/19/21 96.2 kg (212 lb)  05/05/21 94.2 kg (207 lb 9.6 oz)   PHYSICAL EXAM: General:  NAD. No resp difficulty HEENT: Normal Neck: Supple. JVP 6-7. Carotids 2+ bilat; no bruits. No lymphadenopathy or thryomegaly appreciated. Cor: PMI nondisplaced. Regular rate & rhythm. No rubs, gallops or murmurs. Lungs: Clear Abdomen: Soft, nontender, +distended. No hepatosplenomegaly. No bruits or masses. Good bowel sounds. Extremities: No cyanosis, clubbing, rash, edema Neuro: Alert & oriented x 3, cranial nerves grossly intact. Moves all 4 extremities w/o difficulty. Affect pleasant.  Assessment/Plan:  1. Chronic diastolic CHF: EF 76% on cardiac MRI in 6/14 but EF improved back to 55-60% on 2/16 echo.  However, EF down to 26% on Cardiolite in 9/16.  Echo in 9/20 showed EF 60-65%, moderate LVH, normal RV, mild-moderate MR.  Echo in 5/21 with EF 55%, normal RV.  He has a Cardiomems device with last reading showing PADP 28 (goal 24) on 05/20/21.  He appears mildly volume overloaded on exam, NYHA class II-III symptoms.  - Start eplerenone 25 mg daily (gynecomastia with spiro in past). BMET today, repeat in 1 week. - Stop KCl suppl. - Continue torsemide 80 mg bid.  - Continue empagliflozin.  - I encouraged him to send Cardiomems daily.   2. Atrial fibrillation/flutter: Paroxysmal.  He has had atrial flutter ablations x 2 remotely.  He is on sotalol.  QTc ok on ECG today.  He tolerates atrial arrhythmias poorly, seems to worsen CHF.  Zio patch in 4/21 with 20% atrial flutter. He saw EP, repeat ablation was not recommended. He is regular on exam today, no palpitations.  - Continue sotalol, QTc acceptable on recent ECG.   - Continue Eliquis.     3. Coronary artery disease: LHC in 9/16 showed diffuse CAD, most significant involving the distal RCA.  Patient had DES x 2 to RCA.  Cath in 3/17 with patent RCA stents and diffuse distal and branch vessel disease concerning for poorly controlled diabetes.  Cath in 5/18 again showed diffuse CAD without good interventional target.  Cath in 11/20 showed severe distal and branch vessel disease but no interventional targets and not good anatomy for CABG.  He denies chest pain. - No ASA given Eliquis use.  - Continue atorvastatin 80 mg daily, LDL ok in 8/22.  - Continue Coreg, amlodipine, and Imdur 120 mg daily for angina control.  - Avoid ranolazine with sotalol use.  4. Hyperlipidemia:  On atorvastatin, lipids ok in 8/22.  5. Hypertension: Elevated today. sBP at home runs 140s.  - Start eplerenone as above.  6. OSA: He did a home sleep study and is waiting for CPAP.  7. CKD: BMET today.   Followup with APP in 6 weeks.  Maricela Bo [facility] FNP-BC 05/24/2021

## 2021-05-24 ENCOUNTER — Other Ambulatory Visit: Payer: Self-pay

## 2021-05-24 ENCOUNTER — Ambulatory Visit (HOSPITAL_COMMUNITY)
Admission: RE | Admit: 2021-05-24 | Discharge: 2021-05-24 | Disposition: A | Discharge status: Home / Self Care | Payer: Medicare Other | Source: Ambulatory Visit | Attending: Family Medicine | Admitting: Family Medicine

## 2021-05-24 ENCOUNTER — Encounter (HOSPITAL_COMMUNITY): Payer: Self-pay

## 2021-05-24 VITALS — BP 147/90 | HR 79 | Wt 211.0 lb

## 2021-05-24 DIAGNOSIS — I25119 Atherosclerotic heart disease of native coronary artery with unspecified angina pectoris: Secondary | ICD-10-CM | POA: Insufficient documentation

## 2021-05-24 DIAGNOSIS — I5032 Chronic diastolic (congestive) heart failure: Secondary | ICD-10-CM | POA: Diagnosis not present

## 2021-05-24 DIAGNOSIS — Z794 Long term (current) use of insulin: Secondary | ICD-10-CM | POA: Insufficient documentation

## 2021-05-24 DIAGNOSIS — E785 Hyperlipidemia, unspecified: Secondary | ICD-10-CM | POA: Diagnosis not present

## 2021-05-24 DIAGNOSIS — I10 Essential (primary) hypertension: Secondary | ICD-10-CM | POA: Diagnosis not present

## 2021-05-24 DIAGNOSIS — G4733 Obstructive sleep apnea (adult) (pediatric): Secondary | ICD-10-CM | POA: Diagnosis not present

## 2021-05-24 DIAGNOSIS — Z8249 Family history of ischemic heart disease and other diseases of the circulatory system: Secondary | ICD-10-CM | POA: Diagnosis not present

## 2021-05-24 DIAGNOSIS — I130 Hypertensive heart and chronic kidney disease with heart failure and stage 1 through stage 4 chronic kidney disease, or unspecified chronic kidney disease: Secondary | ICD-10-CM | POA: Diagnosis not present

## 2021-05-24 DIAGNOSIS — N1831 Chronic kidney disease, stage 3a: Secondary | ICD-10-CM

## 2021-05-24 DIAGNOSIS — Z7901 Long term (current) use of anticoagulants: Secondary | ICD-10-CM | POA: Insufficient documentation

## 2021-05-24 DIAGNOSIS — Z79899 Other long term (current) drug therapy: Secondary | ICD-10-CM | POA: Insufficient documentation

## 2021-05-24 DIAGNOSIS — M109 Gout, unspecified: Secondary | ICD-10-CM | POA: Diagnosis not present

## 2021-05-24 DIAGNOSIS — N189 Chronic kidney disease, unspecified: Secondary | ICD-10-CM | POA: Diagnosis not present

## 2021-05-24 DIAGNOSIS — Z87891 Personal history of nicotine dependence: Secondary | ICD-10-CM | POA: Diagnosis not present

## 2021-05-24 DIAGNOSIS — I2510 Atherosclerotic heart disease of native coronary artery without angina pectoris: Secondary | ICD-10-CM | POA: Diagnosis not present

## 2021-05-24 DIAGNOSIS — I4892 Unspecified atrial flutter: Secondary | ICD-10-CM | POA: Insufficient documentation

## 2021-05-24 DIAGNOSIS — E1122 Type 2 diabetes mellitus with diabetic chronic kidney disease: Secondary | ICD-10-CM | POA: Insufficient documentation

## 2021-05-24 DIAGNOSIS — I480 Paroxysmal atrial fibrillation: Secondary | ICD-10-CM | POA: Diagnosis not present

## 2021-05-24 DIAGNOSIS — I4819 Other persistent atrial fibrillation: Secondary | ICD-10-CM

## 2021-05-24 DIAGNOSIS — Z955 Presence of coronary angioplasty implant and graft: Secondary | ICD-10-CM | POA: Insufficient documentation

## 2021-05-24 DIAGNOSIS — Z7984 Long term (current) use of oral hypoglycemic drugs: Secondary | ICD-10-CM | POA: Diagnosis not present

## 2021-05-24 LAB — BASIC METABOLIC PANEL
Anion gap: 7 (ref 5–15)
BUN: 9 mg/dL (ref 8–23)
CO2: 26 mmol/L (ref 22–32)
Calcium: 9.1 mg/dL (ref 8.9–10.3)
Chloride: 107 mmol/L (ref 98–111)
Creatinine, Ser: 1.39 mg/dL — ABNORMAL HIGH (ref 0.61–1.24)
GFR, Estimated: 56 mL/min — ABNORMAL LOW (ref >60)
Glucose, Bld: 217 mg/dL — ABNORMAL HIGH (ref 70–99)
Potassium: 3.7 mmol/L (ref 3.5–5.1)
Sodium: 140 mmol/L (ref 135–145)

## 2021-05-24 MED ORDER — COLCHICINE 0.6 MG PO TABS: ORAL_TABLET | ORAL | 1 refills | Status: DC

## 2021-05-24 MED ORDER — ISOSORBIDE MONONITRATE ER 120 MG PO TB24: 120.0000 mg | ORAL_TABLET | Freq: Every day | ORAL | 6 refills | Status: DC

## 2021-05-24 MED ORDER — EPLERENONE 25 MG PO TABS: 25.0000 mg | ORAL_TABLET | Freq: Every day | ORAL | 11 refills | Status: DC

## 2021-05-24 MED ORDER — APIXABAN 5 MG PO TABS
5.0000 mg | ORAL_TABLET | Freq: Two times a day (BID) | ORAL | 6 refills | Status: DC
Start: 2021-05-24 — End: 2022-09-28

## 2021-05-24 NOTE — Patient Instructions (Signed)
START Eplerenone 25mg , one tab daily  STOP Potassium  Labs today We will only contact you if something comes back abnormal or we need to make some changes. Otherwise no news is good news!  Labs needed in one week  Your physician recommends that you schedule a follow-up appointment in: 4-6 weeks  in the Advanced Practitioners (PA/NP) Clinic and in 4 months  Do the following things EVERYDAY: Weigh yourself in the morning before breakfast. Write it down and keep it in a log. Take your medicines as prescribed Eat low salt foods--Limit salt (sodium) to 2000 mg per day.  Stay as active as you can everyday Limit all fluids for the day to less than 2 liters  At the [facility], you and your health needs are our priority. As part of our continuing mission to provide you with exceptional heart care, we have created designated Provider Care Teams. These Care Teams include your primary Cardiologist (physician) and Advanced Practice Providers (APPs- Physician Assistants and Nurse Practitioners) who all work together to provide you with the care you need, when you need it.   You may see any of the following providers on your designated Care Team at your next follow up: Dr Glori Bickers Dr Loralie Champagne Dr Patrice Paradise, NP Lyda Jester, [place] Ginnie Smart Audry Riles, PharmD   Please be sure to bring in all your medications bottles to every appointment.

## 2021-05-24 NOTE — Progress Notes (Signed)
ReDS Vest / Clip - 05/24/21 1000       ReDS Vest / Clip   Station Marker D    Ruler Value 32    ReDS Value Range High volume overload    ReDS Actual Value 44

## 2021-05-29 ENCOUNTER — Telehealth: Payer: Self-pay

## 2021-05-29 ENCOUNTER — Telehealth: Payer: Medicare Other

## 2021-05-29 NOTE — Telephone Encounter (Signed)
  Care Management   Follow Up Note   05/29/2021 Name: [name] [name] MRN: [id number] DOB: 1955/09/26   Referred by: [name], MD Reason for referral : Chronic Care Management (RNCM: Follow up Outreach Chronic Care Management and coordination needs-Unsuccessful outreach)   An unsuccessful telephone outreach was attempted today. The patient was referred to the case management team for assistance with care management and care coordination.  Mail box full Follow Up Plan: The care management team will reach out to the patient again over the next 30 days.  Peter Garter RN, Jackquline Denmark, CDE Care Management Coordinator [facility] [phone], Mobile [phone]

## 2021-05-30 NOTE — Telephone Encounter (Signed)
Rescheduled RNCM call for 06/23/21

## 2021-06-01 ENCOUNTER — Encounter: Payer: Self-pay | Admitting: Endocrinology

## 2021-06-01 ENCOUNTER — Other Ambulatory Visit (HOSPITAL_COMMUNITY): Payer: Medicare Other

## 2021-06-20 ENCOUNTER — Telehealth (HOSPITAL_COMMUNITY): Payer: Self-pay

## 2021-06-20 NOTE — Telephone Encounter (Signed)
Called to confirm/remind patient of their appointment at the [facility] on 06/21/21.   Patient reminded to bring all medications and/or complete list.  Confirmed patient has transportation. Gave directions, instructed to utilize [address] parking.  Confirmed appointment prior to ending call.

## 2021-06-20 NOTE — Progress Notes (Addendum)
Patient ID: [name] [name], male   DOB: November 29, 1955, 65 y.o.   MRN: [id number] PCP: [name], MD Cardiology: Dr. Aundra Dubin  65 y.o. with history of HTN, diabetes, paroxysmal atrial fibrillation, and CAD presents for cardiology followup.  He was admitted in 5/13 with chest pain with exertion.  LHC was done showing global HK with EF 45% and diffuse, severe distal and branch vessel disease.  There was no interventional option, but it was suspect that this disease could be causing his anginal-type pain.  Echo at that time was read as showing EF 60-65%.  He was admitted in 5/14 with hypertensive urgency and chest pain.  He ruled out for MI and BP was controlled.  His EF was 50-55% by echo.  ETT-Sestamibi done as outpatient showed small fixed inferior defect with no ischemia but EF was 31%.  He was quite hypertensive during the study.  Cardiac MRI done to confirm EF in 5/14 showed EF 44%.  He had an episode of atrial fibrillation/RVR in 3/15 but went back into NSR.  Echo in 2/16 showed EF 55-60% with moderate diastolic dysfunction.   He had a Cardiolite in 9/16 showing inferior ischemia.  He was having exertional dyspnea with less activity than normal.  There was concern for worsened CAD and he was sent for [facility] in 9/16. By RHC, filling pressures were not significantly elevated. He had a severe distal RCA stenosis that was treated with DES x 2.  Echo showed EF 55-60%.    In 10/16, he was admitted with acute cholecystitis.  He received a cholecystostomy tube.  Cholecystectomy done in 6/17.   Atrial fibrillation in 1/17, cardioverted to NSR.   Recurrent concerning chest pain in 3/17.  Repeat LHC showed diffuse distal and branch vessel disease consistent with poorly-controlled DM, but patent RCA stents.    He was admitted with atrial fibrillation in 12/17.  Started on sotalol and converted to NSR.    He had recurrent chest pain in 5/18 with abnormal Cardiolite.  Cath was done again in 5/18,  showing diffuse CAD with no good interventional options.   Admitted 11/20 with increased dyspnea and exertional chest pain. Hgb went down to 8.1. GI consulted.  Had Osborne that showed severe distal and branch vessel disease but no interventional targets and not good anatomy for CABG. Continued on medical management. Capsule endoscopy without recent or active bleeding, no AVMs. He went into atrial flutter during his hospitalization. He went back into NSR, however, after discharge.  +Snoring and daytime sleepiness.   He has had episodes of syncope associated with vigorous coughing.   He was in the ER with atrial flutter and dyspnea in 4/21, in NSR today on sotalol.    Today he returns for HF follow up. Overall feeling fine. He remains dyspneic walking on flat ground for longer distances. Feels his abdomen may be tighter. Denies CP, dizziness, palpitations, abnormal bleeding, or PND/Orthopnea. Appetite ok. No fever or chills. Weight at home 212 pounds. Has missed night time meds a couple of times recently. He has not taken his torsemide a couple of days. Stays thirsty and has been drinking a lot of fluids. Daughter has breast CA and is undergoing treatments currently.  ECG (personally reviewed): SR with AVB PR 228 msec qTc 458 msec  Cardiomems: PADP 25 mmHg 7 days ago (goal 24)  ReDs: 36%  Labs (5/13): K 3.6, creatinine 0.93, LDL 85, HDL 57 Labs (7/13): K 3.9, creatinine 1.1, LDL 57, HDL 51  Labs (5/14): K 4, creatinine 1.25 Labs (8/14): K 3.7, creatinine 0.9, LDL 103, HDL 42 Labs (8/15): K 3.5 creatinine 1.0, BNP 75 Labs (2/16): K 3.8, creatinine 1.1 Labs (9/16): K 3.5, creatinine 1.03, BNP 59, HDL 40, LDL 111, TGs 333 Labs (11/16): K 3.6, creaitnine 1.07, LFTs normal, LDL 44 Labs (6/17): K 3.4, creatinine 1.52 => 1.13 Labs (1/18): K 3.9, creatinine 1.25 Labs (6/18): K 3.8, creatinine 1.21, HCT 39.7 Labs (8/18): LDL 39, HDL 43 Labs (12/18): 4.4, creatinine 1.25, LFTs normal Labs (8/19): K  3.6, creatinine 1.13, LDL 68, HDL 49  Labs (11/20): K 3.5, creatinine 1.33, LDL 25 Labs (4/21): K 4.0, SCr 1.14, BNP 266  Labs (6/21): K 3.7, creatinine 1.25 Labs (12/21): LDL 25 Labs (1/22): BNP 132 Labs (2/22): K 3.4, creatinine 1.44 Labs (8/22): LDL 81 Labs (9/22): K 3.2, creatinine 1.53 Labs (10/22): K 3.7, creatinine 1.39  PMH: 1. DM2 2. Gout 3. HTN: Cough with ACEI.  4. Cardiomyopathy: Suspect mixed ischemic/nonischemic (?ETOH-related).  EF 35% in 2008.  Echo in 5/13 showed EF 60-65% with moderate LVH but EF was 45% on LV-gram in 5/13.  Echo (5/14) with EF 50-55%, mild LVH, inferobasal HK, mild MR.  ETT-Sestamibi in 5/14, however, showed EF 31%.  Cardiac MRI (6/14) with EF 44%, mild global hypokinesis, subepicardial delayed enhancement in a nonspecific RV insertion site pattern.  Echo (2/16) with EF 55-60%, moderate diastolic dysfunction. RHC (9/16) with mean RA 8, PA 39/20 mean 28, mean PCWP 14, CI 2.2. Echo (9/16) with EF 55-60%, grade II diastolic dysfunction.  - Echo (6/17) with EF 55-60%, moderate LVH, grade II diastolic dysfunction, mild AI, mild MR.  - RHC (11/20): mean RA 4, PA 46/14 mean 26, mean PCWP 13, CI 3.23 - Echo (9/20): EF 60-65%, moderate LVH, normal RV size and systolic function, mild-moderate MR, mild AI.  - Echo (5/21): EF 55%, RV normal - Has Cardiomems.  5. CAD: LHC in 2010 with mild nonobstructive disease.  LHC (5/13) with diffuse distal and branch vessel disease, mild global hypokinesis and EF 45%.  ETT-Sestamibi (5/14): EF 31%, small fixed inferior defect with no ischemia.  Lexiscan Cardiolite (9/16) with EF 26%, moderate inferior defect that was partially reversible, suggesting ischemia => High risk study. LHC (9/16) with 40-50% mLAD, diffuse up to 50% distal LAD, 90% ostium of branch off ramus, 95% dRCA => DES to RCA x 2, EF 55-60%.   - LHC (3/17) with diffuse branch and distal vessel disease c/w poorly controlled DM, RCA stents patent.  - LHC (5/18): Diffuse  CAD with no good interventional option. 60% mRCA, 90% dLAD, 50% serial mid ramus stenoses, ramus branches with ostial 80-90% stenoses. - LHC (11/20): Severe distal and branch vessel disease but no interventional targets and not good anatomy for CABG  6. H/o chronic HBV 7. Osteochondroma left shoulder.  8. Hyperlipidemia 9. Paroxysmal atrial fibrillation: Coumadin.  Developed cough and increased ESR with amiodarone.  DCCV in 1/17.  Recurrent atrial fibrillation in 12/17, sotalol started.  10. Atrial flutter: had ablations in 2007 and 2008.  - Zio patch in 4/21 with 20% atrial flutter.  11. OSA on CPAP.  12. Acute cholecystitis (10/16): Cholecystostomy tube placed.  Cholecystectomy 6/17.  13. CKD 14. Anemia - Suspected GI bleed but EGD/c-scope 9/20 without significant findings.  - Capsule Endoscopy (11/20) without recent or active bleeding, no AVMs.  15. Syncope: Suspected cough syncope.  16. OSA  SH: Married, prior smoker.  Some ETOH, occasionally heavy in past.  Does use occasional marijuana. Out of work Engineer, drilling. 2 daughters in grad school.   FH: CAD  ROS: All systems reviewed and negative except as per HPI.   Current Outpatient Medications  Medication Sig Dispense Refill   ACCU-CHEK SOFTCLIX LANCETS lancets Use as instructed for three times daily testing of blood glucose 100 each 12   acetaminophen (TYLENOL) 500 MG tablet Take 1,000 mg by mouth every 6 (six) hours as needed (pain).     albuterol (VENTOLIN HFA) 108 (90 Base) MCG/ACT inhaler Inhale 1-2 puffs into the lungs every 6 (six) hours as needed for wheezing or shortness of breath. 8 g 0   amLODipine (NORVASC) 5 MG tablet Take 1 tablet by mouth once daily 90 tablet 1   apixaban (ELIQUIS) 5 MG TABS tablet Take 1 tablet (5 mg total) by mouth 2 (two) times daily. 60 tablet 6   atorvastatin (LIPITOR) 80 MG tablet Take 1 tablet (80 mg total) by mouth daily. 90 tablet 3   blood glucose meter kit and supplies KIT Dispense based on  patient and insurance preference. Use up to four times daily as directed. (FOR ICD-9 250.00, 250.01). 1 each 0   Blood Glucose Monitoring Suppl (ACCU-CHEK AVIVA PLUS) w/Device KIT Use as directed for 3 times daily testing of blood glucose 1 kit 0   carvedilol (COREG) 25 MG tablet TAKE 1 TABLET BY MOUTH TWICE DAILY WITH A MEAL 180 tablet 3   colchicine 0.6 MG tablet On day 1 take 2 tablets at first sign of gout flare.  Take 1 tablet 1 hour later.  Then take 1 tablet daily starting the next day until resolution of gout flare. 10 tablet 1   empagliflozin (JARDIANCE) 10 MG TABS tablet Take 1 tablet (10 mg total) by mouth daily before breakfast. 30 tablet 11   eplerenone (INSPRA) 25 MG tablet Take 1 tablet (25 mg total) by mouth daily. 30 tablet 11   glucose blood (ACCU-CHEK AVIVA PLUS) test strip Use as instructed for 3 times daily testing of blood glucose 100 each 12   guaiFENesin (MUCINEX PO) Take 1 tablet by mouth 2 (two) times daily as needed (cough/congestion).      hydrALAZINE (APRESOLINE) 50 MG tablet TAKE 1 & 1/2 (ONE & ONE-HALF) TABLETS BY MOUTH TWICE DAILY. MUST MAKE FOLLOW UP APPT FOR FURTHER REFILLS 90 tablet 5   insulin glargine (LANTUS SOLOSTAR) 100 UNIT/ML Solostar Pen Inject 40 Units into the skin every morning. And pen needles 1/day 45 mL 3   Insulin Pen Needle (PEN NEEDLES) 32G X 4 MM MISC 1 each by Does not apply route daily. 100 each 3   isosorbide mononitrate (IMDUR) 120 MG 24 hr tablet Take 1 tablet (120 mg total) by mouth daily. 30 tablet 6   Lancet Devices (ACCU-CHEK SOFTCLIX) lancets Use as instructed for 3 times daily testing of blood glucose 1 each 0   meclizine (ANTIVERT) 12.5 MG tablet Take 1 tablet (12.5 mg total) by mouth 3 (three) times daily as needed for dizziness. 12 tablet 0   metFORMIN (GLUCOPHAGE) 1000 MG tablet Take 1 tablet (1,000 mg total) by mouth daily with breakfast. TAKE 1 TABLET BY MOUTH TWICE DAILY WITH A MEAL 60 tablet 3   nitroGLYCERIN (NITROSTAT) 0.4 MG SL  tablet Place 1 tablet (0.4 mg total) under the tongue every 5 (five) minutes as needed for chest pain (x 3 tabs daily). 60 tablet 1   sotalol (BETAPACE) 120 MG tablet Take 1 tablet (120 mg total) by mouth  2 (two) times daily. 180 tablet 3   torsemide (DEMADEX) 20 MG tablet Take 4 tablets (80 mg total) by mouth 2 (two) times daily. 240 tablet 11   TRUEPLUS LANCETS 28G MISC 1 each by Does not apply route 3 (three) times daily. 100 each 12   No current facility-administered medications for this encounter.   BP (!) 150/88   Pulse 79   Wt 96.4 kg (212 lb 9.6 oz)   SpO2 96%   BMI 28.83 kg/m    Wt Readings from Last 3 Encounters:  06/21/21 96.4 kg (212 lb 9.6 oz)  05/24/21 95.7 kg (211 lb)  05/19/21 96.2 kg (212 lb)   PHYSICAL EXAM: General:  NAD. No resp difficulty HEENT: Normal Neck: Supple. No JVD. Carotids 2+ bilat; no bruits. No lymphadenopathy or thryomegaly appreciated. Cor: PMI nondisplaced. Regular rate & rhythm. No rubs, gallops or murmurs. Lungs: Clear Abdomen: Soft, nontender, + distended. No hepatosplenomegaly. No bruits or masses. Good bowel sounds. Extremities: No cyanosis, clubbing, rash, trace LE edema Neuro: Alert & oriented x 3, cranial nerves grossly intact. Moves all 4 extremities w/o difficulty. Affect pleasant.  Assessment/Plan: 1. Chronic diastolic CHF: EF 78% on cardiac MRI in 6/14 but EF improved back to 55-60% on 2/16 echo.  However, EF down to 26% on Cardiolite in 9/16.  Echo in 9/20 showed EF 60-65%, moderate LVH, normal RV, mild-moderate MR.  Echo in 5/21 with EF 55%, normal RV.  He has a Cardiomems device with last reading showing PADP 25 (goal 24) on 06/13/21. He is not overloaded on exam, stable NYHA class II-early III symptoms. ReDs 36%. - Continue eplerenone 25 mg daily (gynecomastia with spiro in past). BMET today. - Continue torsemide 80 mg bid. Needs to be more consistent taking evening doses. - Continue empagliflozin 10 mg daily.  - Continue  hydralazine 75 mg bid (takes twice a day due to difficulties with tid dosing) + Imdur 120 mg daily. - I encouraged him to send Cardiomems daily.   2. Atrial fibrillation/flutter: Paroxysmal. He has had atrial flutter ablations x 2 remotely.  He is on sotalol.  QTc ok on ECG today.  He tolerates atrial arrhythmias poorly, seems to worsen CHF.  Zio patch in 4/21 with 20% atrial flutter. He saw EP, repeat ablation was not recommended. He is regular on exam today, no palpitations.  - Continue sotalol, QTc acceptable on ECG today.   - Continue Eliquis.     3. Coronary artery disease: LHC in 9/16 showed diffuse CAD, most significant involving the distal RCA.  Patient had DES x 2 to RCA.  Cath in 3/17 with patent RCA stents and diffuse distal and branch vessel disease concerning for poorly controlled diabetes.  Cath in 5/18 again showed diffuse CAD without good interventional target.  Cath in 11/20 showed severe distal and branch vessel disease but no interventional targets and not good anatomy for CABG.  He denies chest pain. - No ASA given Eliquis use.  - Continue atorvastatin 80 mg daily, LDL ok in 8/22.  - Continue Coreg, amlodipine, and Imdur 120 mg daily for angina control.  - Avoid ranolazine with sotalol use.  4. Hyperlipidemia:  On atorvastatin, lipids ok in 8/22.  5. Hypertension: Elevated today. sBP at home runs 140s.  - Increase amlodipine to 10 mg daily. If he has more LE swelling, OK to reduce back to 5 mg. 6. OSA: He did a home sleep study and is waiting for CPAP.  7. CKD: BMET today.  8. DM: On SGLT2i + insulin. A1C 8.7 (9/22).  Followup with Dr. Aundra Dubin in 2 months as scheduled.  Maricela Bo [facility] FNP-BC 06/21/2021

## 2021-06-21 ENCOUNTER — Other Ambulatory Visit: Payer: Self-pay

## 2021-06-21 ENCOUNTER — Ambulatory Visit (HOSPITAL_COMMUNITY)
Admission: RE | Admit: 2021-06-21 | Discharge: 2021-06-21 | Disposition: A | Discharge status: Home / Self Care | Payer: Medicare Other | Source: Ambulatory Visit | Attending: Family Medicine | Admitting: Family Medicine

## 2021-06-21 ENCOUNTER — Encounter (HOSPITAL_COMMUNITY): Payer: Self-pay

## 2021-06-21 VITALS — BP 150/88 | HR 79 | Wt 212.6 lb

## 2021-06-21 DIAGNOSIS — I4892 Unspecified atrial flutter: Secondary | ICD-10-CM | POA: Insufficient documentation

## 2021-06-21 DIAGNOSIS — Z87891 Personal history of nicotine dependence: Secondary | ICD-10-CM | POA: Diagnosis not present

## 2021-06-21 DIAGNOSIS — I10 Essential (primary) hypertension: Secondary | ICD-10-CM

## 2021-06-21 DIAGNOSIS — N189 Chronic kidney disease, unspecified: Secondary | ICD-10-CM | POA: Diagnosis not present

## 2021-06-21 DIAGNOSIS — I130 Hypertensive heart and chronic kidney disease with heart failure and stage 1 through stage 4 chronic kidney disease, or unspecified chronic kidney disease: Secondary | ICD-10-CM | POA: Insufficient documentation

## 2021-06-21 DIAGNOSIS — Z7984 Long term (current) use of oral hypoglycemic drugs: Secondary | ICD-10-CM | POA: Diagnosis not present

## 2021-06-21 DIAGNOSIS — Z955 Presence of coronary angioplasty implant and graft: Secondary | ICD-10-CM | POA: Diagnosis not present

## 2021-06-21 DIAGNOSIS — Z8639 Personal history of other endocrine, nutritional and metabolic disease: Secondary | ICD-10-CM

## 2021-06-21 DIAGNOSIS — R0600 Dyspnea, unspecified: Secondary | ICD-10-CM | POA: Diagnosis not present

## 2021-06-21 DIAGNOSIS — E1122 Type 2 diabetes mellitus with diabetic chronic kidney disease: Secondary | ICD-10-CM | POA: Insufficient documentation

## 2021-06-21 DIAGNOSIS — I480 Paroxysmal atrial fibrillation: Secondary | ICD-10-CM | POA: Diagnosis not present

## 2021-06-21 DIAGNOSIS — Z79899 Other long term (current) drug therapy: Secondary | ICD-10-CM | POA: Insufficient documentation

## 2021-06-21 DIAGNOSIS — G4733 Obstructive sleep apnea (adult) (pediatric): Secondary | ICD-10-CM | POA: Insufficient documentation

## 2021-06-21 DIAGNOSIS — I2510 Atherosclerotic heart disease of native coronary artery without angina pectoris: Secondary | ICD-10-CM

## 2021-06-21 DIAGNOSIS — I5032 Chronic diastolic (congestive) heart failure: Secondary | ICD-10-CM | POA: Insufficient documentation

## 2021-06-21 DIAGNOSIS — I25119 Atherosclerotic heart disease of native coronary artery with unspecified angina pectoris: Secondary | ICD-10-CM | POA: Insufficient documentation

## 2021-06-21 DIAGNOSIS — N1831 Chronic kidney disease, stage 3a: Secondary | ICD-10-CM

## 2021-06-21 DIAGNOSIS — Z7901 Long term (current) use of anticoagulants: Secondary | ICD-10-CM | POA: Diagnosis not present

## 2021-06-21 DIAGNOSIS — Z09 Encounter for follow-up examination after completed treatment for conditions other than malignant neoplasm: Secondary | ICD-10-CM | POA: Diagnosis not present

## 2021-06-21 DIAGNOSIS — E785 Hyperlipidemia, unspecified: Secondary | ICD-10-CM | POA: Diagnosis not present

## 2021-06-21 LAB — BASIC METABOLIC PANEL
Anion gap: 8 (ref 5–15)
BUN: 16 mg/dL (ref 8–23)
CO2: 22 mmol/L (ref 22–32)
Calcium: 9.4 mg/dL (ref 8.9–10.3)
Chloride: 106 mmol/L (ref 98–111)
Creatinine, Ser: 1.47 mg/dL — ABNORMAL HIGH (ref 0.61–1.24)
GFR, Estimated: 52 mL/min — ABNORMAL LOW (ref >60)
Glucose, Bld: 286 mg/dL — ABNORMAL HIGH (ref 70–99)
Potassium: 4.4 mmol/L (ref 3.5–5.1)
Sodium: 136 mmol/L (ref 135–145)

## 2021-06-21 MED ORDER — AMLODIPINE BESYLATE 10 MG PO TABS: 10.0000 mg | ORAL_TABLET | Freq: Every day | ORAL | 3 refills | Status: DC

## 2021-06-21 NOTE — Patient Instructions (Signed)
INCREASE Amlodipine to 10 mg, one tab daily  Labs today We will only contact you if something comes back abnormal or we need to make some changes. Otherwise no news is good news!   Keep follow as scheduled  Do the following things EVERYDAY: Weigh yourself in the morning before breakfast. Write it down and keep it in a log. Take your medicines as prescribed Eat low salt foods--Limit salt (sodium) to 2000 mg per day.  Stay as active as you can everyday Limit all fluids for the day to less than 2 liters  At the [facility], you and your health needs are our priority. As part of our continuing mission to provide you with exceptional heart care, we have created designated Provider Care Teams. These Care Teams include your primary Cardiologist (physician) and Advanced Practice Providers (APPs- Physician Assistants and Nurse Practitioners) who all work together to provide you with the care you need, when you need it.   You may see any of the following providers on your designated Care Team at your next follow up: Dr Glori Bickers Dr Haynes Kerns, NP Lyda Jester, [place] [facility] [place], [place] Audry Riles, PharmD   Please be sure to bring in all your medications bottles to every appointment.    If you have any questions or concerns before your next appointment please send [place] a message through [place] or call our office at [phone].    TO LEAVE A MESSAGE FOR THE NURSE SELECT OPTION 2, PLEASE LEAVE A MESSAGE INCLUDING: YOUR NAME DATE OF BIRTH CALL BACK NUMBER REASON FOR CALL**this is important as we prioritize the call backs  YOU WILL RECEIVE A CALL BACK THE SAME DAY AS LONG AS YOU CALL BEFORE 4:00 PM

## 2021-06-21 NOTE — Progress Notes (Signed)
ReDS Vest / Clip - 06/21/21 1000       ReDS Vest / Clip   Station Marker C    Ruler Value 34    ReDS Value Range Moderate volume overload    ReDS Actual Value 36

## 2021-06-23 ENCOUNTER — Ambulatory Visit (INDEPENDENT_AMBULATORY_CARE_PROVIDER_SITE_OTHER): Payer: Medicare Other

## 2021-06-23 DIAGNOSIS — E1165 Type 2 diabetes mellitus with hyperglycemia: Secondary | ICD-10-CM

## 2021-06-23 DIAGNOSIS — I5032 Chronic diastolic (congestive) heart failure: Secondary | ICD-10-CM

## 2021-06-23 DIAGNOSIS — I10 Essential (primary) hypertension: Secondary | ICD-10-CM

## 2021-06-23 DIAGNOSIS — I4891 Unspecified atrial fibrillation: Secondary | ICD-10-CM

## 2021-06-23 NOTE — Chronic Care Management (AMB) (Signed)
Chronic Care Management   CCM RN Visit Note  06/23/2021 Name: [name] [name] MRN: [id number] DOB: [birth year]  Subjective: [name] [name] is a 65 y.o. year old male who is a primary care patient of [name] Ruddy, MD. The care management team was consulted for assistance with disease management and care coordination needs.    Engaged with patient by telephone for follow up visit in response to provider referral for case management and/or care coordination services.   Consent to Services:  The patient was given information about Chronic Care Management services, agreed to services, and gave verbal consent prior to initiation of services.  Please see initial visit note for detailed documentation.   Patient agreed to services and verbal consent obtained.   Assessment: Review of patient past medical history, allergies, medications, health status, including review of consultants reports, laboratory and other test data, was performed as part of comprehensive evaluation and provision of chronic care management services.   SDOH (Social Determinants of Health) assessments and interventions performed:    CCM Care Plan  Allergies  Allergen Reactions   Shrimp [Shellfish Allergy] Nausea And Vomiting   Ace Inhibitors Cough   Other Hives and Other (See Comments)    Patient reports developing hives after receiving "some antibiotic given in 1980''s at [facility]". He does not know which antibiotic.    Outpatient Encounter Medications as of 06/23/2021  Medication Sig   ACCU-CHEK SOFTCLIX LANCETS lancets Use as instructed for three times daily testing of blood glucose   acetaminophen (TYLENOL) 500 MG tablet Take 1,000 mg by mouth every 6 (six) hours as needed (pain).   albuterol (VENTOLIN HFA) 108 (90 Base) MCG/ACT inhaler Inhale 1-2 puffs into the lungs every 6 (six) hours as needed for wheezing or shortness of breath.   amLODipine (NORVASC) 10 MG tablet Take 1 tablet (10  mg total) by mouth daily.   apixaban (ELIQUIS) 5 MG TABS tablet Take 1 tablet (5 mg total) by mouth 2 (two) times daily.   atorvastatin (LIPITOR) 80 MG tablet Take 1 tablet (80 mg total) by mouth daily.   blood glucose meter kit and supplies KIT Dispense based on patient and insurance preference. Use up to four times daily as directed. (FOR ICD-9 250.00, 250.01).   Blood Glucose Monitoring Suppl (ACCU-CHEK AVIVA PLUS) w/Device KIT Use as directed for 3 times daily testing of blood glucose   carvedilol (COREG) 25 MG tablet TAKE 1 TABLET BY MOUTH TWICE DAILY WITH A MEAL   colchicine 0.6 MG tablet On day 1 take 2 tablets at first sign of gout flare.  Take 1 tablet 1 hour later.  Then take 1 tablet daily starting the next day until resolution of gout flare.   empagliflozin (JARDIANCE) 10 MG TABS tablet Take 1 tablet (10 mg total) by mouth daily before breakfast.   eplerenone (INSPRA) 25 MG tablet Take 1 tablet (25 mg total) by mouth daily.   glucose blood (ACCU-CHEK AVIVA PLUS) test strip Use as instructed for 3 times daily testing of blood glucose   guaiFENesin (MUCINEX PO) Take 1 tablet by mouth 2 (two) times daily as needed (cough/congestion).    hydrALAZINE (APRESOLINE) 50 MG tablet TAKE 1 & 1/2 (ONE & ONE-HALF) TABLETS BY MOUTH TWICE DAILY. MUST MAKE FOLLOW UP APPT FOR FURTHER REFILLS   insulin glargine (LANTUS SOLOSTAR) 100 UNIT/ML Solostar Pen Inject 40 Units into the skin every morning. And pen needles 1/day   Insulin Pen Needle (PEN NEEDLES) 32G X 4  MM MISC 1 each by Does not apply route daily.   isosorbide mononitrate (IMDUR) 120 MG 24 hr tablet Take 1 tablet (120 mg total) by mouth daily.   Lancet Devices (ACCU-CHEK SOFTCLIX) lancets Use as instructed for 3 times daily testing of blood glucose   meclizine (ANTIVERT) 12.5 MG tablet Take 1 tablet (12.5 mg total) by mouth 3 (three) times daily as needed for dizziness.   metFORMIN (GLUCOPHAGE) 1000 MG tablet Take 1 tablet (1,000 mg total) by  mouth daily with breakfast. TAKE 1 TABLET BY MOUTH TWICE DAILY WITH A MEAL   nitroGLYCERIN (NITROSTAT) 0.4 MG SL tablet Place 1 tablet (0.4 mg total) under the tongue every 5 (five) minutes as needed for chest pain (x 3 tabs daily).   sotalol (BETAPACE) 120 MG tablet Take 1 tablet (120 mg total) by mouth 2 (two) times daily.   torsemide (DEMADEX) 20 MG tablet Take 4 tablets (80 mg total) by mouth 2 (two) times daily.   TRUEPLUS LANCETS 28G MISC 1 each by Does not apply route 3 (three) times daily.   No facility-administered encounter medications on file as of 06/23/2021.    Patient Active Problem List   Diagnosis Date Noted   Syncope 02/09/2020   Generalized weakness 06/27/2019   Acute on chronic congestive heart failure with left ventricular diastolic dysfunction ([place]) 06/26/2019   Anemia due to chronic kidney disease 06/26/2019   CKD (chronic kidney disease), stage III ([place]) 06/26/2019   Type 2 diabetes mellitus without complication ([place]) 02/77/4128   Iron deficiency anemia due to chronic blood loss 06/26/2019   History of atrial fibrillation    History of type 2 diabetes mellitus    Anemia    Chest pain 04/24/2019   Overweight (BMI 25.0-29.9) 02/28/2017   Unstable angina ([place]) 08/03/2016   S/P laparoscopic cholecystectomy 02/07/2016   Coronary artery disease involving native coronary artery with unstable angina pectoris ([place]) 12/29/2015   Diabetes ([place]) 11/12/2015   Chronic anticoagulation 08/22/2015   Atrial flutter with controlled response ([place]) 08/22/2015   RUQ pain    Cholelithiases- drain in place 06/04/2015   Essential hypertension    Chronic diastolic CHF (congestive heart failure) ([place]) 04/24/2015   Excessive daytime sleepiness 11/25/2014   Obstructive sleep apnea 11/25/2014   NICM (nonischemic cardiomyopathy) ([place]) 03/11/2014   Anticoagulation goal of INR 2 to 3 10/19/2013   Eczema 10/19/2013   Low libido 10/16/2013   Persistent atrial fibrillation ([place]) 10/06/2013    Rectal bleeding 12/18/2011   HYPERCHOLESTEROLEMIA 07/11/2010   DENTAL CARIES 06/30/2010   GOUT 05/05/2009   HEPATITIS B, CHRONIC 12/30/2006   OSTEOCHONDROMA 12/30/2006   Mitral valve disorder 12/30/2006   LIVER FUNCTION TESTS, ABNORMAL, HX OF 12/30/2006   COLONIC POLYPS, HX OF 12/30/2006   COLONOSCOPY, HX OF 05/12/2001    Conditions to be addressed/monitored:Atrial Fibrillation, CHF, CAD, HTN, and DMII  Care Plan : Cardiovascular disease  (HF,CAD, AF, HTN and HLD)  Updates made by Dimitri Ped, RN since 06/23/2021 12:00 AM     Problem: Lack of long term self management plan for Cardiovascular disease  (HF,CAD, AF, HTN and HLD)   Priority: High     Goal: Symptom Exacerbation Prevented or Minimized   Start Date: 04/28/2021  Expected End Date: 10/11/2021  This Visit's Progress: Not on track  Recent Progress: On track  Priority: High  Note:   Current Barriers:  Knowledge deficits related to basic Cardiovascular disease  (HF,CAD, AF, HTN and HLD) pathophysiology and self care management Unable  to independently self manage Cardiovascular disease  (HF,CAD, AF, HTN and HLD) Does not adhere to provider recommendations re:  Does not adhere to prescribed medication regimen States he does not weigh daily and he has not been sending his CardioMem reading daily.  States his weight has been staying stable from 212-213.  Denies any increase in shortness of breath, swelling or chest pains.   States he does forget to take his PM medications about 2-3 times a week.  States he does not use a pill box but he does have a pill box. States he does eat fast food 2-3 times a week such as [employer] or [employer] when he is keeping his grandson. Nurse Case Manager Clinical Goal(s):  patient will weigh self daily and record patient will verbalize understanding of Heart Failure Action Plan and when to call doctor patient will take all Heart Failure mediations as prescribed Interventions:  Collaboration with  [name] Ruddy, MD regarding development and update of comprehensive plan of care as evidenced by provider attestation and co-signature Inter-disciplinary care team collaboration (see longitudinal plan of care) Basic overview and discussion of pathophysiology of Cardiovascular disease  (HF,CAD, AF, HTN and HLD) Provided written and verbal education on low sodium diet Reviewed Heart Failure Action Plan in depth and provided written copy-mailed [place]  calendar with action plan Assessed for scales in home-has scales Discussed importance of daily weight Reviewed role of diuretics in prevention of fluid overload Reived to try to send in his CardioMem dialy Reviewed to try to avoid fast food with higher sodium  Reivewed to try filling his medications in his pill box to see if that will help him remember to take his evening pills Reviewed to try to set an alarm on his phone to take his PM pills or have a family member remind him to take his medications Self-Care Activities:  Takes Heart Failure Medications as prescribed Weighs daily and record (notifying MD of 3 lb weight gain over night or 5 lb in a week) Verbalizes understanding of and follows CHF Action Plan Adheres to low sodium diet  Patient Goals:  - Take Heart Failure Medications as prescribed - Weigh daily and record (notify MD with 3 lb weight gain over night or 5 lb in a week) - Follow CHF Action Plan - Adhere to low sodium diet - begin a heart failure diary - develop a rescue plan - eat more whole grains, fruits and vegetables, lean meats and healthy fats - follow rescue plan if symptoms flare-up - know when to call the doctor - track symptoms and what helps feel better or worse - dress right for the weather, hot or cold - pace activity allowing for rest - call office if I gain more than 2 pounds in one day or 5 pounds in one week - keep legs up while sitting - track weight in diary - use salt in  moderation - watch for swelling in feet, ankles and legs every day - weigh myself daily - call for medicine refill 2 or 3 days before it runs out - keep a list of all the medicines I take; vitamins and herbals too - learn to read medicine labels - use a pillbox to sort medicine - use an alarm clock or phone to remind me to take my medicine Follow Up Plan: Telephone follow up appointment with care management team member scheduled for: 07/21/21 The patient has been provided with contact information for the care management team and  has been advised to call with any health related questions or concerns.      Care Plan : Diabetes Type 2 (Adult)  Updates made by Dimitri Ped, RN since 06/23/2021 12:00 AM     Problem: Glycemic Management (Diabetes, Type 2)   Priority: Medium     Long-Range Goal: Glycemic Management Optimized   Start Date: 04/28/2021  Expected End Date: 10/11/2021  This Visit's Progress: Not on track  Recent Progress: On track  Priority: Medium  Note:   Objective:  Lab Results  Component Value Date   HGBA1C 9.0 (A) 02/27/2021   Lab Results  Component Value Date   CREATININE 1.39 04/12/2021   CREATININE 1.51 (H) 03/31/2021   CREATININE 1.23 03/21/2021   No results found for: EGFR Current Barriers:  Knowledge Deficits related to basic Diabetes pathophysiology and self care/management Knowledge Deficits related to medications used for management of diabetes Unable to independently self manage Type 2 diabetes Does not adhere to provider recommendations re:  Does not adhere to prescribed medication regimen States he checks his blood sugars every few days with fasting reading of around 170 and it was up to 385 one day. States he has not taken his insulin when it was higher.  States he it hurts to stick his finger but he reports that he does not change the lancet on his lancing device.  States he does have lancets.  Denies any hypoglycemia.  States he sometimes  forgets to take his insulin. Continues to  declines pharmacy referral.  States he ties to eat fruit and vegetables every day. States he has a front window that leaks water into his house that needs to be repaired and he did get the information on resources to get it repaired Case Manager Clinical Goal(s):  patient will demonstrate improved adherence to prescribed treatment plan for diabetes self care/management as evidenced by: daily monitoring and recording of CBG  adherence to ADA/ carb modified diet adherence to prescribed medication regimen contacting provider for new or worsened symptoms or questions Interventions:  Collaboration with [name] Ruddy, MD regarding development and update of comprehensive plan of care as evidenced by provider attestation and co-signature Inter-disciplinary care team collaboration (see longitudinal plan of care) Reinforced education to patient about basic DM disease process Reviewed medications with patient and discussed importance of medication adherence Discussed plans with patient for ongoing care management follow up and provided patient with direct contact information for care management team Reinforced education on hypo and hyperglycemia and importance of correct treatment Reviewed scheduled/upcoming provider appointments including: Dr. Aundra Dubin 08/28/21, Dr. Loanne Drilling 07/17/21 Advised patient, providing education and rationale, to check cbg daily and record, calling provider for findings outside established parameters.   Referral made to pharmacy team for assistance with declines referral to assist with adherence at this time Referral made to community resources care guide team for assistance with resources for home repairs-provided resources to pt Review of patient status, including review of consultants reports, relevant laboratory and other test results, and medications completed. Reviewed to change lancet with each use on his lancing device to help decrease  pain and to use side of his finger. Reviewed importance of not missing insulin doses Self-Care Activities - Self administers oral medications as prescribed Self administers insulin as prescribed Attends all scheduled provider appointments Checks blood sugars as prescribed and utilize hyper and hypoglycemia protocol as needed Adheres to prescribed ADA/carb modified Patient Goals: - check blood sugar at prescribed times - check blood sugar if  I feel it is too high or too low - enter blood sugar readings and medication or insulin into daily log - take the blood sugar log to all doctor visits - take the blood sugar meter to all doctor visits - change to whole grain breads, cereal, pasta - drink 6 to 8 glasses of water each day - eat fish at least once per week - fill half of plate with vegetables - limit fast food meals to no more than 1 per week - manage portion size - prepare main meal at home 3 to 5 days each week - reduce red meat to 2 to 3 times a week - switch to low-fat or skim milk - switch to sugar-free drinks - keep appointment with eye doctor - check feet daily for cuts, sores or redness - keep feet up while sitting - trim toenails straight across - wash and dry feet carefully every day - wear comfortable, cotton socks - wear comfortable, well-fitting shoes Follow Up Plan: Telephone follow up appointment with care management team member scheduled for: 07/21/21  The patient has been provided with contact information for the care management team and has been advised to call with any health related questions or concerns.       Plan:Telephone follow up appointment with care management team member scheduled for:  12/9/2 The patient has been provided with contact information for the care management team and has been advised to call with any health related questions or concerns.  Peter Garter RN, Jackquline Denmark, CDE Care Management Coordinator [facility] [phone], Mobile [phone]

## 2021-06-23 NOTE — Patient Instructions (Signed)
Visit Information Blood Glucose Monitoring, Adult Monitoring your blood sugar (glucose) is an important part of managing your diabetes. Blood glucose monitoring involves checking your blood glucose as often as directed and keeping a log or record of your results over time. Checking your blood glucose regularly and keeping a blood glucose log can: Help you and your health care provider adjust your diabetes management plan as needed, including your medicines or insulin. Help you understand how food, exercise, illnesses, and medicines affect your blood glucose. Let you know what your blood glucose is at any time. You can quickly find out if you have low blood glucose (hypoglycemia) or high blood glucose (hyperglycemia). Your health care provider will set individualized treatment goals for you. Your goals will be based on your age, other medical conditions you have, and how you respond to diabetes treatment. Generally, the goal of treatment is to maintain the following blood glucose levels: Before meals (preprandial): 80-130 mg/dL (4.4-7.2 mmol/L). After meals (postprandial): below 180 mg/dL (10 mmol/L). A1C level: less than 7%. Supplies needed: Blood glucose meter. Test strips for your meter. Each meter has its own strips. You must use the strips that came with your meter. A needle to prick your finger (lancet). Do not use a lancet more than one time. A device that holds the lancet (lancing device). A journal or log book to write down your results. How to check your blood glucose Checking your blood glucose  Wash your hands for at least 20 seconds with soap and water. Prick the side of your finger (not the tip) with the lancet. Do not use the same finger consecutively. Gently rub the finger until a small drop of blood appears. Follow instructions that come with your meter for inserting the test strip, applying blood to the strip, and using your blood glucose meter. Write down your result and any  notes in your log. Using alternative sites Some meters allow you to use areas of your body other than your finger (alternative sites) to test your blood. The most common alternative sites are the forearm, the thigh, and the palm of your hand. Alternative sites may not be as accurate as the fingers because blood flow is slower in those areas. This means that the result you get may be delayed, and it may be different from the result that you would get from your finger. Use the finger only, and do not use alternative sites, if: You think you have hypoglycemia. You sometimes do not know that your blood glucose is getting low (hypoglycemia unawareness). General tips and recommendations Blood glucose log  Every time you check your blood glucose, write down your result. Also write down any notes about things that may be affecting your blood glucose, such as your diet and exercise for the day. This information can help you and your health care provider: Look for patterns in your blood glucose over time. Adjust your diabetes management plan as needed. Check if your meter allows you to download your records to a computer or if there is an app for the meter. Most glucose meters store a record of glucose readings in the meter. If you have type 1 diabetes: Check your blood glucose 4 or more times a day if you are on intensive insulin therapy with multiple daily injections (MDI) or if you are using an insulin pump. Check your blood glucose: Before every meal and snack. Before bedtime. Also check your blood glucose: If you have symptoms of hypoglycemia. After treating low  blood glucose. Before doing activities that create a risk for injury, like driving or using machinery. Before and after exercise. Two hours after a meal. Occasionally between 2:00 a.m. and 3:00 a.m., as directed. You may need to check your blood glucose more often, 6-10 times per day, if: You have diabetes that is not well  controlled. You are ill. You have a history of severe hypoglycemia. You have hypoglycemia unawareness. If you have type 2 diabetes: Check your blood glucose 2 or more times a day if you take insulin or other diabetes medicines. Check your blood glucose 4 or more times a day if you are on intensive insulin therapy. Occasionally, you may also need to check your glucose between 2:00 a.m. and 3:00 a.m., as directed. Also check your blood glucose: Before and after exercise. Before doing activities that create a risk for injury, like driving or using machinery. You may need to check your blood glucose more often if: Your medicine is being adjusted. Your diabetes is not well controlled. You are ill. General tips Make sure you always have your supplies with you. After you use a few boxes of test strips, adjust (calibrate) your blood glucose meter by following instructions that came with your meter. If you have questions or need help, all blood glucose meters have a 24-hour hotline phone number available that you can call. Also contact your health care provider with questions or concerns you may have. Where to find more information The American Diabetes Association: www.diabetes.org The Association of Diabetes [employer] & Education Specialists: www.diabeteseducator.org Contact a health care provider if: Your blood glucose is at or above 240 mg/dL (13.3 mmol/L) for 2 days in a row. You have been sick or have had a fever for 2 days or longer, and you are not getting better. You have any of the following problems for more than 6 hours: You cannot eat or drink. You have nausea or vomiting. You have diarrhea. Get help right away if: Your blood glucose is lower than 54 mg/dL (3 mmol/L). You become confused, or you have trouble thinking clearly. You have difficulty breathing. You have moderate or large ketone levels in your urine. These symptoms may represent a serious problem that is an emergency. Do  not wait to see if the symptoms will go away. Get medical help right away. Call your local emergency services (911 in the [place] Do not drive yourself to the hospital. Summary Monitoring your blood glucose is an important part of managing your diabetes. Blood glucose monitoring involves checking your blood glucose as often as directed and keeping a log or record of your results over time. Your health care provider will set individualized treatment goals for you. Your goals will be based on your age, other medical conditions you have, and how you respond to diabetes treatment. Every time you check your blood glucose, write down your result. Also, write down any notes about things that may be affecting your blood glucose, such as your diet and exercise for the day. This information is not intended to replace advice given to you by your health care provider. Make sure you discuss any questions you have with your health care provider. Document Revised: 04/27/2020 Document Reviewed: 04/27/2020 Elsevier Patient Education  2022 [employer]             This Visit's Progress    RNCM:Eat Healthy   Not on track    Timeframe:  Long-Range Goal Priority:  Medium Start Date:  04/28/21                         Expected End Date:   10/11/21                    Follow Up Date 05/29/21    - change to whole grain breads, cereal, pasta - drink 6 to 8 glasses of water each day - eat fish at least once per week - fill half of plate with vegetables - limit fast food meals to no more than 1 per week - manage portion size - prepare main meal at home 3 to 5 days each week - read food labels for fat, fiber, carbohydrates and portion size - switch to low-fat or skim milk - switch to sugar-free drinks    Why is this important?   When you are ready to manage your nutrition or weight, having a plan and setting goals will help.  Taking small steps to change how you eat and exercise is a good  place to start.    Notes:      RNCM:Manage My Medicine   Not on track    Timeframe:  Long-Range Goal Priority:  Medium Start Date:      04/28/21                       Expected End Date:  10/11/21                     Follow Up Date 05/29/21    - call for medicine refill 2 or 3 days before it runs out - keep a list of all the medicines I take; vitamins and herbals too - learn to read medicine labels - use a pillbox to sort medicine - use an alarm clock or phone to remind me to take my medicine    Why is this important?   These steps will help you keep on track with your medicines.   Notes:      RNCM:Monitor and Manage My Blood Sugar-Diabetes Type 2   Not on track    Timeframe:  Long-Range Goal Priority:  Medium Start Date:   04/28/21                          Expected End Date:  10/11/21                     Follow Up Date 05/29/21    - check blood sugar at prescribed times - check blood sugar if I feel it is too high or too low - enter blood sugar readings and medication or insulin into daily log - take the blood sugar log to all doctor visits - take the blood sugar meter to all doctor visits    Why is this important?   Checking your blood sugar at home helps to keep it from getting very high or very low.  Writing the results in a diary or log helps the doctor know how to care for you.  Your blood sugar log should have the time, date and the results.  Also, write down the amount of insulin or other medicine that you take.  Other information, like what you ate, exercise done and how you were feeling, will also be helpful.     Notes:      RNCM:Track and Manage Fluids and Swelling-Heart  Failure   Not on track    Timeframe:  Long-Range Goal Priority:  High Start Date:    04/28/21                         Expected End Date:    04/13/22                   Follow Up Date 05/29/21    - call office if I gain more than 2 pounds in one day or 5 pounds in one week - keep legs up while  sitting - track weight in diary - use salt in moderation - watch for swelling in feet, ankles and legs every day - weigh myself daily    Why is this important?   It is important to check your weight daily and watch how much salt and liquids you have.  It will help you to manage your heart failure.    Notes:          Patient verbalizes understanding of instructions provided today and agrees to view in [facility].   Telephone follow up appointment with care management team member scheduled for: 07/21/21 Peter Garter RN, [facility], CDE Care Management Coordinator [place] [phone], Mobile [phone]

## 2021-06-26 ENCOUNTER — Other Ambulatory Visit (HOSPITAL_COMMUNITY): Payer: Self-pay | Admitting: Cardiology

## 2021-06-26 DIAGNOSIS — I5032 Chronic diastolic (congestive) heart failure: Secondary | ICD-10-CM

## 2021-06-26 DIAGNOSIS — I480 Paroxysmal atrial fibrillation: Secondary | ICD-10-CM

## 2021-06-26 DIAGNOSIS — I2510 Atherosclerotic heart disease of native coronary artery without angina pectoris: Secondary | ICD-10-CM

## 2021-07-12 DIAGNOSIS — E1165 Type 2 diabetes mellitus with hyperglycemia: Secondary | ICD-10-CM | POA: Diagnosis not present

## 2021-07-12 DIAGNOSIS — I4891 Unspecified atrial fibrillation: Secondary | ICD-10-CM | POA: Diagnosis not present

## 2021-07-12 DIAGNOSIS — I5032 Chronic diastolic (congestive) heart failure: Secondary | ICD-10-CM | POA: Diagnosis not present

## 2021-07-12 DIAGNOSIS — I10 Essential (primary) hypertension: Secondary | ICD-10-CM | POA: Diagnosis not present

## 2021-07-17 ENCOUNTER — Ambulatory Visit: Payer: Medicare Other | Admitting: Endocrinology

## 2021-07-19 ENCOUNTER — Ambulatory Visit (INDEPENDENT_AMBULATORY_CARE_PROVIDER_SITE_OTHER): Payer: Medicare Other

## 2021-07-19 ENCOUNTER — Telehealth: Payer: Self-pay

## 2021-07-19 VITALS — BP 139/62 | Temp 97.8°F | Ht 72.0 in | Wt 207.1 lb

## 2021-07-19 DIAGNOSIS — Z0000 Encounter for general adult medical examination without abnormal findings: Secondary | ICD-10-CM

## 2021-07-19 NOTE — Progress Notes (Signed)
Returned patients call left message about rescheduling CCM appt.  Noreene Larsson, [address], [place], [place] [address] Direct Dial: [phone] [email or web address]

## 2021-07-19 NOTE — Progress Notes (Signed)
Subjective:   Gaynor Ferreras is a 65 y.o. male who presents for Medicare Annual/Subsequent preventive examination.  Review of Systems    No ROS Cardiac Risk Factors include: advanced age (>91mn, >>88women);hypertension;male gender    Objective:    Today's Vitals   07/19/21 1359  BP: 139/62  Temp: 97.8 F (36.6 C)  SpO2: 99%  Weight: 207 lb 1.6 oz (93.9 kg)  Height: 6' (1.829 m)   Body mass index is 28.09 kg/m.  Advanced Directives 07/19/2021 04/28/2021 07/18/2020 02/01/2020 01/29/2020 12/02/2019 06/28/2019  Does Patient Have a Medical Advance Directive? No No No No No No No  Does patient want to make changes to medical advance directive? - - No - Patient declined - - - -  Would patient like information on creating a medical advance directive? No - Patient declined Yes (ED - Information included in AVS) - No - Patient declined No - Patient declined No - Patient declined No - Patient declined  Pre-existing out of facility DNR order (yellow form or pink MOST form) - - - - - - -    Current Medications (verified) Outpatient Encounter Medications as of 07/19/2021  Medication Sig   ACCU-CHEK SOFTCLIX LANCETS lancets Use as instructed for three times daily testing of blood glucose   acetaminophen (TYLENOL) 500 MG tablet Take 1,000 mg by mouth every 6 (six) hours as needed (pain).   albuterol (VENTOLIN HFA) 108 (90 Base) MCG/ACT inhaler Inhale 1-2 puffs into the lungs every 6 (six) hours as needed for wheezing or shortness of breath.   amLODipine (NORVASC) 10 MG tablet Take 1 tablet (10 mg total) by mouth daily.   apixaban (ELIQUIS) 5 MG TABS tablet Take 1 tablet (5 mg total) by mouth 2 (two) times daily.   atorvastatin (LIPITOR) 80 MG tablet Take 1 tablet (80 mg total) by mouth daily.   blood glucose meter kit and supplies KIT Dispense based on patient and insurance preference. Use up to four times daily as directed. (FOR ICD-9 250.00, 250.01).   Blood Glucose Monitoring Suppl  (ACCU-CHEK AVIVA PLUS) w/Device KIT Use as directed for 3 times daily testing of blood glucose   carvedilol (COREG) 25 MG tablet TAKE 1 TABLET BY MOUTH TWICE DAILY WITH A MEAL   colchicine 0.6 MG tablet On day 1 take 2 tablets at first sign of gout flare.  Take 1 tablet 1 hour later.  Then take 1 tablet daily starting the next day until resolution of gout flare.   empagliflozin (JARDIANCE) 10 MG TABS tablet Take 1 tablet (10 mg total) by mouth daily before breakfast.   eplerenone (INSPRA) 25 MG tablet Take 1 tablet (25 mg total) by mouth daily.   glucose blood (ACCU-CHEK AVIVA PLUS) test strip Use as instructed for 3 times daily testing of blood glucose   guaiFENesin (MUCINEX PO) Take 1 tablet by mouth 2 (two) times daily as needed (cough/congestion).    hydrALAZINE (APRESOLINE) 50 MG tablet TAKE 1 & 1/2 (ONE & ONE-HALF) TABLETS BY MOUTH TWICE DAILY. MUST MAKE FOLLOW UP APPT FOR FURTHER REFILLS   insulin glargine (LANTUS SOLOSTAR) 100 UNIT/ML Solostar Pen Inject 40 Units into the skin every morning. And pen needles 1/day   Insulin Pen Needle (PEN NEEDLES) 32G X 4 MM MISC 1 each by Does not apply route daily.   isosorbide mononitrate (IMDUR) 120 MG 24 hr tablet Take 1 tablet (120 mg total) by mouth daily.   Lancet Devices (ACCU-CHEK SOFTCLIX) lancets Use as instructed for  3 times daily testing of blood glucose   meclizine (ANTIVERT) 12.5 MG tablet Take 1 tablet (12.5 mg total) by mouth 3 (three) times daily as needed for dizziness.   metFORMIN (GLUCOPHAGE) 1000 MG tablet Take 1 tablet (1,000 mg total) by mouth daily with breakfast. TAKE 1 TABLET BY MOUTH TWICE DAILY WITH A MEAL   nitroGLYCERIN (NITROSTAT) 0.4 MG SL tablet Place 1 tablet (0.4 mg total) under the tongue every 5 (five) minutes as needed for chest pain (x 3 tabs daily).   sotalol (BETAPACE) 120 MG tablet Take 1 tablet (120 mg total) by mouth 2 (two) times daily.   torsemide (DEMADEX) 20 MG tablet Take 4 tablets (80 mg total) by mouth 2  (two) times daily.   TRUEPLUS LANCETS 28G MISC 1 each by Does not apply route 3 (three) times daily.   [DISCONTINUED] amLODipine (NORVASC) 5 MG tablet Take 1 tablet by mouth once daily   [DISCONTINUED] apixaban (ELIQUIS) 5 MG TABS tablet Take 5 mg by mouth 2 (two) times daily.   [DISCONTINUED] carvedilol (COREG) 25 MG tablet TAKE 1 TABLET BY MOUTH TWICE DAILY WITH A MEAL   [DISCONTINUED] colchicine 0.6 MG tablet On day 1 take 2 tablets at first sign of gout flare.  Take 1 tablet 1 hour later.  Then take 1 tablet daily starting the next day until resolution of gout flare. (Patient not taking: Reported on 05/24/2021)   [DISCONTINUED] isosorbide mononitrate (IMDUR) 120 MG 24 hr tablet Take 1 tablet by mouth once daily   [DISCONTINUED] potassium chloride (KLOR-CON) 10 MEQ tablet Take 4 tablets (40 mEq total) by mouth daily.   No facility-administered encounter medications on file as of 07/19/2021.    Allergies (verified) Shrimp [shellfish allergy], Ace inhibitors, and Other   History: Past Medical History:  Diagnosis Date   Anginal pain ([place])    Anxiety    Arrhythmia    CAD (coronary artery disease), native coronary artery    a. Nonobstructive CAD by cath 2013 - diffuse distal and branch vessel CAD, no severe disease in the major coronaries, LV mild global hypokinesis, EF 45%. b. ETT-Sestamibi 5/14: EF 31%, small fixed inferior defect with no ischemia.   Cholecystitis    Chronic CHF ([place])    a. Mixed ICM/NICM (?EtOH). EF 35% in 2008. Echo 5/13: EF 60-65%, mod LVH, EF 45% on V gram in 12/2011. EF 12/2012: EF 50-55%, mild LVH, inferobasal HK, mild MR. ETT-Ses 5/14 EF 41%. Cardiac MRI 5/14: EF 44%, mild global HK, subepicardial delayed enhancement in nonspecific RV insertion pattern.   COLONIC POLYPS, HX OF 12/30/2006   Gout    H/O atrial flutter 2007   a. Ablations in 2007, 2008.   Heart murmur    HEPATITIS B, CHRONIC 12/30/2006   History of alcohol abuse    History of hiatal hernia    History  of medication noncompliance    History of nuclear stress test    Myoview 3/17: + chest pain; EF 33%, downsloping ST depression 2, V4-6, LVH with strain, no ischemia on images; intermediate risk    HYPERCHOLESTEROLEMIA 07/11/2010   Hypertension    Left sciatic nerve pain since 04/2015   LIVER FUNCTION TESTS, ABNORMAL, HX OF 12/30/2006   MITRAL REGURGITATION 12/30/2006   Osteochondrosarcoma ([place]) 1972   "left shoulder"   PAF (paroxysmal atrial fibrillation) ([place])    On coumadin   Rectal bleeding 12/18/2011   Scheduled for colonoscopy.     Shortness of breath dyspnea    with excertion  Sleep apnea    "suppose to send mask but they never did" (05/03/2015)   Type II diabetes mellitus [facility])    Past Surgical History:  Procedure Laterality Date   A FLUTTER ABLATION  2007, 2008   catheter ablation    CARDIAC CATHETERIZATION N/A 05/03/2015   Procedure: Right/Left Heart Cath and Coronary Angiography;  Surgeon: Larey Dresser, MD;  Location: [place] [facility];  Service: Cardiovascular;  Laterality: N/A;   CARDIAC CATHETERIZATION N/A 05/03/2015   Procedure: Coronary Stent Intervention;  Surgeon: Jettie Booze, MD;  Location: [place] [facility];  Service: Cardiovascular;  Laterality: N/A;   CARDIAC CATHETERIZATION N/A 11/04/2015   Procedure: Left Heart Cath and Coronary Angiography;  Surgeon: Larey Dresser, MD;  Location: [place] [facility];  Service: Cardiovascular;  Laterality: N/A;   CARDIOVERSION N/A 08/24/2015   Procedure: CARDIOVERSION;  Surgeon: Thayer Headings, MD;  Location: [facility] ENDOSCOPY;  Service: Cardiovascular;  Laterality: N/A;   CHOLECYSTECTOMY N/A 02/07/2016   Procedure: LAPAROSCOPIC CHOLECYSTECTOMY WITH  INTRAOPERATIVE CHOLANGIOGRAM;  Surgeon: Greer Pickerel, MD;  Location: [facility];  Service: General;  Laterality: N/A;   COLONOSCOPY WITH PROPOFOL N/A 04/28/2019   Procedure: COLONOSCOPY WITH PROPOFOL;  Surgeon: Ronnette Juniper, MD;  Location: [place];  Service: Gastroenterology;   Laterality: N/A;   CORONARY ANGIOPLASTY  12/05/01   ESOPHAGOGASTRODUODENOSCOPY (EGD) WITH PROPOFOL N/A 04/27/2019   Procedure: ESOPHAGOGASTRODUODENOSCOPY (EGD) WITH PROPOFOL;  Surgeon: Ronnette Juniper, MD;  Location: [place];  Service: Gastroenterology;  Laterality: N/A;   GIVENS CAPSULE STUDY N/A 06/29/2019   Procedure: GIVENS CAPSULE STUDY;  Surgeon: Wilford Corner, MD;  Location: [place];  Service: Endoscopy;  Laterality: N/A;   LEFT HEART CATH AND CORONARY ANGIOGRAPHY N/A 12/28/2016   Procedure: Left Heart Cath and Coronary Angiography;  Surgeon: Larey Dresser, MD;  Location: [place] [facility];  Service: Cardiovascular;  Laterality: N/A;   LEFT HEART CATHETERIZATION WITH CORONARY ANGIOGRAM N/A 12/19/2011   Procedure: LEFT HEART CATHETERIZATION WITH CORONARY ANGIOGRAM;  Surgeon: Larey Dresser, MD;  Location: [facility] CATH LAB;  Service: Cardiovascular;  Laterality: N/A;   OSTEOCHONDROMA EXCISION Left 1972   "took bone tumor off my shoulder"   POLYPECTOMY  04/28/2019   Procedure: POLYPECTOMY;  Surgeon: Ronnette Juniper, MD;  Location: [facility] ENDOSCOPY;  Service: Gastroenterology;;   PRESSURE SENSOR/CARDIOMEMS N/A 02/01/2020   Procedure: PRESSURE SENSOR/CARDIOMEMS;  Surgeon: Larey Dresser, MD;  Location: [place] [facility];  Service: Cardiovascular;  Laterality: N/A;   RIGHT HEART CATH N/A 02/01/2020   Procedure: RIGHT HEART CATH;  Surgeon: Larey Dresser, MD;  Location: [place] [facility];  Service: Cardiovascular;  Laterality: N/A;   RIGHT/LEFT HEART CATH AND CORONARY ANGIOGRAPHY N/A 07/01/2019   Procedure: RIGHT/LEFT HEART CATH AND CORONARY ANGIOGRAPHY;  Surgeon: Larey Dresser, MD;  Location: [place] [facility];  Service: Cardiovascular;  Laterality: N/A;   Family History  Problem Relation Age of Onset   Diabetes Mother    Hypertension Mother    Heart attack Neg Hx    Stroke Neg Hx    Social History   Socioeconomic History   Marital status: Widowed    Spouse name: Not on file    Number of children: 2   Years of education: 14   Highest education level: Some college, no degree  Occupational History    Employer: UNEMPLOYED  Tobacco Use   Smoking status: Former    Packs/day: 4.00    Years: 18.00    Pack years: 72.00  Types: Cigarettes    Quit date: 08/14/1983    Years since quitting: 37.9   Smokeless tobacco: Never  Vaping Use   Vaping Use: Never used  Substance and Sexual Activity   Alcohol use: Yes    Alcohol/week: 16.0 standard drinks    Types: 6 Cans of beer, 10 Shots of liquor per week    Comment: occasionally   Drug use: Yes    Types: Marijuana    Comment: twice a week   Sexual activity: Never  Other Topics Concern   Not on file  Social History Narrative   Not on file   Social Determinants of Health   Financial Resource Strain: Low Risk    Difficulty of Paying Living Expenses: Not hard at all  Food Insecurity: No Food Insecurity   Worried About Charity fundraiser in the Last Year: Never true   Ran Out of Food in the Last Year: Never true  Transportation Needs: No Transportation Needs   Lack of Transportation (Medical): No   Lack of Transportation (Non-Medical): No  Physical Activity: Insufficiently Active   Days of Exercise per Week: 1 day   Minutes of Exercise per Session: 10 min  Stress: No Stress Concern Present   Feeling of Stress : Not at all  Social Connections: Socially Isolated   Frequency of Communication with Friends and Family: More than three times a week   Frequency of Social Gatherings with Friends and Family: More than three times a week   Attends Religious Services: Never   Marine scientist or Organizations: No   Attends Archivist Meetings: Never   Marital Status: Widowed   Clinical Intake:  Pre-visit preparation completed: Yes  Nutrition Risk Assessment:  Has the patient had any N/V/D within the last 2 months?  Yes  Does the patient have any non-healing wounds?  No  Has the patient had any  unintentional weight loss or weight gain?  No   Diabetes:  Is the patient diabetic?  Yes  If diabetic, was a CBG obtained today?  No  Did the patient bring in their glucometer from home?  No   Financial Strains and Diabetes Management:  Are you having any financial strains with the device, your supplies or your medication? No .  Does the patient want to be seen by Chronic Care Management for management of their diabetes?  No  Followed by Dr Hale Bogus  Diabetic Exams:  Diabetic Eye Exam: Completed Yes . Overdue for diabetic eye exam. Pt has been advised about the importance in completing this exam. Followed by Dr Gershon Crane Diabetic Foot Exam: Completed Yes Followed by PCP.   Diabetic?  Yes  Interpreter Needed?: No   Activities of Daily Living In your present state of health, do you have any difficulty performing the following activities: 07/19/2021  Hearing? N  Vision? N  Comment Wears glasses. Followed by Dr Gershon Crane  Difficulty concentrating or making decisions? N  Walking or climbing stairs? N  Dressing or bathing? N  Doing errands, shopping? N  Preparing Food and eating ? N  Using the Toilet? N  In the past six months, have you accidently leaked urine? N  Do you have problems with loss of bowel control? N  Managing your Medications? N  Managing your Finances? N  Housekeeping or managing your Housekeeping? N  Some recent data might be hidden    Patient Care Team: [name], MD as PCP - General (Family Medicine) Larey Dresser,  MD as PCP - Cardiology (Cardiology) Dimitri Ped, RN as Case Manager  Indicate any recent Medical Services you may have received from other than [facility] providers in the past year (date may be approximate).     Assessment:   This is a routine wellness examination for Erikson.  Hearing/Vision screen Hearing Screening - Comments:: No difficulty hearing.  Vision Screening - Comments:: Wears glasses. Followed by Dr Gershon Crane  Dietary  issues and exercise activities discussed: Current Exercise Habits: Home exercise routine, Type of exercise: walking, Time (Minutes): 10, Frequency (Times/Week): 1, Weekly Exercise (Minutes/Week): 10, Intensity: Mild   Goals Addressed             This Visit's Progress    Exercise 3x per week (30 min per time)         Depression Screen PHQ 2/9 Scores 07/19/2021 04/28/2021 07/18/2020 08/20/2019 09/29/2018 07/08/2018 04/07/2018  PHQ - 2 Score 0 0 0 0 1 0 1  PHQ- 9 Score - - 0 - '5 7 6    ' Fall Risk Fall Risk  07/19/2021 04/28/2021 07/18/2020 09/29/2018 07/08/2018  Falls in the past year? 0 0 0 0 0  Number falls in past yr: 0 0 0 - -  Injury with Fall? 0 0 0 - -  Risk for fall due to : - No Fall Risks Medication side effect - -  Follow up - Education provided;Falls prevention discussed Falls evaluation completed;Falls prevention discussed - -    FALL RISK PREVENTION PERTAINING TO THE HOME:  Any stairs in or around the home? No  If so, are there any without handrails? No  Home free of loose throw rugs in walkways, pet beds, electrical cords, etc? Yes Adequate lighting in your home to reduce risk of falls? Yes   ASSISTIVE DEVICES UTILIZED TO PREVENT FALLS:  Life alert? No  Use of a cane, walker or w/c? No  Grab bars in the bathroom? No  Shower chair or bench in shower? No  Elevated toilet seat or a handicapped toilet? No   TIMED UP AND GO:  Was the test performed? Yes .  Length of time to ambulate 10 feet: 5 sec.   Gait steady and fast without use of assistive device  Cognitive Function:     6CIT Screen 07/19/2021  What Year? 0 points  What month? 0 points  What time? 0 points  Count back from 20 0 points  Months in reverse 0 points  Repeat phrase 0 points  Total Score 0    Immunizations Immunization History  Administered Date(s) Administered   Fluad Quad(high Dose 65+) 04/23/2019, 07/18/2020   Influenza Split 07/28/2012   Influenza Whole 06/30/2010    Influenza,inj,Quad PF,6+ Mos 08/01/2013, 05/04/2015, 08/04/2016, 04/07/2018   PFIZER(Purple Top)SARS-COV-2 Vaccination 09/19/2019, 10/10/2019, 06/13/2020   Pneumococcal Polysaccharide-23 03/06/2009   Tdap 09/29/2018      Flu Vaccine status: Due, Education has been provided regarding the importance of this vaccine. Advised may receive this vaccine at local pharmacy or Health Dept. Aware to provide a copy of the vaccination record if obtained from local pharmacy or Health Dept. Verbalized acceptance and understanding.  Pneumococcal vaccine status: Due, Education has been provided regarding the importance of this vaccine. Advised may receive this vaccine at local pharmacy or Health Dept. Aware to provide a copy of the vaccination record if obtained from local pharmacy or Health Dept. Verbalized acceptance and understanding.  Covid-19 vaccine status: Declined, Education has been provided regarding the importance of this vaccine but patient  still declined. Advised may receive this vaccine at local pharmacy or Health Dept.or vaccine clinic. Aware to provide a copy of the vaccination record if obtained from local pharmacy or Health Dept. Verbalized acceptance and understanding.  Qualifies for Shingles Vaccine? Yes   Zostavax completed No   Shingrix Completed?: No.    Education has been provided regarding the importance of this vaccine. Patient has been advised to call insurance company to determine out of pocket expense if they have not yet received this vaccine. Advised may also receive vaccine at local pharmacy or Health Dept. Verbalized acceptance and understanding.  Screening Tests Health Maintenance  Topic Date Due   COVID-19 Vaccine (4 - Booster for Pfizer series) 08/04/2021 (Originally 08/08/2020)   Zoster Vaccines- Shingrix (1 of 2) 10/17/2021 (Originally 04/02/1975)   INFLUENZA VACCINE  11/10/2021 (Originally 03/13/2021)   Pneumonia Vaccine 53+ Years old (2 - PCV) 07/19/2022 (Originally  03/06/2010)   OPHTHALMOLOGY EXAM  10/21/2021   HEMOGLOBIN A1C  11/02/2021   FOOT EXAM  05/05/2022   TETANUS/TDAP  09/29/2028   COLONOSCOPY (Pts 45-75yr Insurance coverage will need to be confirmed)  04/27/2029   Hepatitis C Screening  Completed   HPV VACCINES  Aged Out    Health Maintenance  There are no preventive care reminders to display for this patient.   Additional Screening:   Vision Screening: Recommended annual ophthalmology exams for early detection of glaucoma and other disorders of the eye. Is the patient up to date with their annual eye exam?  Yes  Who is the provider or what is the name of the office in which the patient attends annual eye exams? Followed by Dr SGershon Crane   Dental Screening: Recommended annual dental exams for proper oral hygiene  Community Resource Referral / Chronic Care Management:  CRR required this visit?  No   CCM required this visit?  No      Plan:     I have personally reviewed and noted the following in the patient's chart:   Medical and social history Use of alcohol, tobacco or illicit drugs  Current medications and supplements including opioid prescriptions. Patient currently not taking opioids. Functional ability and status Nutritional status Physical activity Advanced directives List of other physicians Hospitalizations, surgeries, and ER visits in previous 12 months Vitals Screenings to include cognitive, depression, and falls Referrals and appointments  In addition, I have reviewed and discussed with patient certain preventive protocols, quality metrics, and best practice recommendations. A written personalized care plan for preventive services as well as general preventive health recommendations were provided to patient.     BCriselda Peaches LPN

## 2021-07-19 NOTE — Patient Instructions (Addendum)
[name] , Thank you for taking time to come for your Medicare Wellness Visit. I appreciate your ongoing commitment to your health goals. Please review the following plan we discussed and let me know if I can assist you in the future.   These are the goals we discussed:  Goals      Blood Pressure < 140/90     Exercise 3x per week (30 min per time)     HEMOGLOBIN A1C < 7.0     RNCM:Eat Healthy     Timeframe:  Long-Range Goal Priority:  Medium Start Date:   04/28/21                         Expected End Date:   10/11/21                    Follow Up Date 05/29/21    - change to whole grain breads, cereal, pasta - drink 6 to 8 glasses of water each day - eat fish at least once per week - fill half of plate with vegetables - limit fast food meals to no more than 1 per week - manage portion size - prepare main meal at home 3 to 5 days each week - read food labels for fat, fiber, carbohydrates and portion size - switch to low-fat or skim milk - switch to sugar-free drinks    Why is this important?   When you are ready to manage your nutrition or weight, having a plan and setting goals will help.  Taking small steps to change how you eat and exercise is a good place to start.    Notes:      RNCM:Manage My Medicine     Timeframe:  Long-Range Goal Priority:  Medium Start Date:      04/28/21                       Expected End Date:  10/11/21                     Follow Up Date 05/29/21    - call for medicine refill 2 or 3 days before it runs out - keep a list of all the medicines I take; vitamins and herbals too - learn to read medicine labels - use a pillbox to sort medicine - use an alarm clock or phone to remind me to take my medicine    Why is this important?   These steps will help you keep on track with your medicines.   Notes:      RNCM:Monitor and Manage My Blood Sugar-Diabetes Type 2     Timeframe:  Long-Range Goal Priority:  Medium Start Date:   04/28/21                           Expected End Date:  10/11/21                     Follow Up Date 05/29/21    - check blood sugar at prescribed times - check blood sugar if I feel it is too high or too low - enter blood sugar readings and medication or insulin into daily log - take the blood sugar log to all doctor visits - take the blood sugar meter to all doctor visits    Why is this important?   Checking  your blood sugar at home helps to keep it from getting very high or very low.  Writing the results in a diary or log helps the doctor know how to care for you.  Your blood sugar log should have the time, date and the results.  Also, write down the amount of insulin or other medicine that you take.  Other information, like what you ate, exercise done and how you were feeling, will also be helpful.     Notes:      RNCM:Track and Manage Fluids and Swelling-Heart Failure     Timeframe:  Long-Range Goal Priority:  High Start Date:    04/28/21                         Expected End Date:    04/13/22                   Follow Up Date 05/29/21    - call office if I gain more than 2 pounds in one day or 5 pounds in one week - keep legs up while sitting - track weight in diary - use salt in moderation - watch for swelling in feet, ankles and legs every day - weigh myself daily    Why is this important?   It is important to check your weight daily and watch how much salt and liquids you have.  It will help you to manage your heart failure.    Notes:         This is a list of the screening recommended for you and due dates:  Health Maintenance  Topic Date Due   COVID-19 Vaccine (4 - Booster for Pfizer series) 08/04/2021*   Zoster (Shingles) Vaccine (1 of 2) 10/17/2021*   Flu Shot  11/10/2021*   Pneumonia Vaccine (2 - PCV) 07/19/2022*   Eye exam for diabetics  10/21/2021   Hemoglobin A1C  11/02/2021   Complete foot exam   05/05/2022   Tetanus Vaccine  09/29/2028   Colon Cancer Screening  04/27/2029    Hepatitis C Screening: USPSTF Recommendation to screen - Ages 18-79 yo.  Completed   HPV Vaccine  Aged Out  *Topic was postponed. The date shown is not the original due date.    Advanced directives: No  Conditions/risks identified: None  Next appointment: Follow up in one year for your annual wellness visit.   Preventive Care 47 Years and Older, Male Preventive care refers to lifestyle choices and visits with your health care provider that can promote health and wellness. What does preventive care include? A yearly physical exam. This is also called an annual well check. Dental exams once or twice a year. Routine eye exams. Ask your health care provider how often you should have your eyes checked. Personal lifestyle choices, including: Daily care of your teeth and gums. Regular physical activity. Eating a healthy diet. Avoiding tobacco and drug use. Limiting alcohol use. Practicing safe sex. Taking low doses of aspirin every day. Taking vitamin and mineral supplements as recommended by your health care provider. What happens during an annual well check? The services and screenings done by your health care provider during your annual well check will depend on your age, overall health, lifestyle risk factors, and family history of disease. Counseling  Your health care provider may ask you questions about your: Alcohol use. Tobacco use. Drug use. Emotional well-being. Home and relationship well-being. Sexual activity. Eating habits. History of falls. Memory  and ability to understand (cognition). Work and work Statistician. Screening  You may have the following tests or measurements: Height, weight, and BMI. Blood pressure. Lipid and cholesterol levels. These may be checked every 5 years, or more frequently if you are over 56 years old. Skin check. Lung cancer screening. You may have this screening every year starting at age 73 if you have a 30-pack-year history of smoking  and currently smoke or have quit within the past 15 years. Fecal occult blood test (FOBT) of the stool. You may have this test every year starting at age 15. Flexible sigmoidoscopy or colonoscopy. You may have a sigmoidoscopy every 5 years or a colonoscopy every 10 years starting at age 14. Prostate cancer screening. Recommendations will vary depending on your family history and other risks. Hepatitis C blood test. Hepatitis B blood test. Sexually transmitted disease (STD) testing. Diabetes screening. This is done by checking your blood sugar (glucose) after you have not eaten for a while (fasting). You may have this done every 1-3 years. Abdominal aortic aneurysm (AAA) screening. You may need this if you are a current or former smoker. Osteoporosis. You may be screened starting at age 21 if you are at high risk. Talk with your health care provider about your test results, treatment options, and if necessary, the need for more tests. Vaccines  Your health care provider may recommend certain vaccines, such as: Influenza vaccine. This is recommended every year. Tetanus, diphtheria, and acellular pertussis (Tdap, Td) vaccine. You may need a Td booster every 10 years. Zoster vaccine. You may need this after age 98. Pneumococcal 13-valent conjugate (PCV13) vaccine. One dose is recommended after age 41. Pneumococcal polysaccharide (PPSV23) vaccine. One dose is recommended after age 32. Talk to your health care provider about which screenings and vaccines you need and how often you need them. This information is not intended to replace advice given to you by your health care provider. Make sure you discuss any questions you have with your health care provider. Document Released: 08/26/2015 Document Revised: 04/18/2016 Document Reviewed: 05/31/2015 Elsevier Interactive Patient Education  2017 [place] Prevention in the Home Falls can cause injuries. They can happen to people of all ages.  There are many things you can do to make your home safe and to help prevent falls. What can I do on the outside of my home? Regularly fix the edges of walkways and driveways and fix any cracks. Remove anything that might make you trip as you walk through a door, such as a raised step or threshold. Trim any bushes or trees on the path to your home. Use bright outdoor lighting. Clear any walking paths of anything that might make someone trip, such as rocks or tools. Regularly check to see if handrails are loose or broken. Make sure that both sides of any steps have handrails. Any raised decks and porches should have guardrails on the edges. Have any leaves, snow, or ice cleared regularly. Use sand or salt on walking paths during winter. Clean up any spills in your garage right away. This includes oil or grease spills. What can I do in the bathroom? Use night lights. Install grab bars by the toilet and in the tub and shower. Do not use towel bars as grab bars. Use non-skid mats or decals in the tub or shower. If you need to sit down in the shower, use a plastic, non-slip stool. Keep the floor dry. Clean up any water that spills on  the floor as soon as it happens. Remove soap buildup in the tub or shower regularly. Attach bath mats securely with double-sided non-slip rug tape. Do not have throw rugs and other things on the floor that can make you trip. What can I do in the bedroom? Use night lights. Make sure that you have a light by your bed that is easy to reach. Do not use any sheets or blankets that are too big for your bed. They should not hang down onto the floor. Have a firm chair that has side arms. You can use this for support while you get dressed. Do not have throw rugs and other things on the floor that can make you trip. What can I do in the kitchen? Clean up any spills right away. Avoid walking on wet floors. Keep items that you use a lot in easy-to-reach places. If you need  to reach something above you, use a strong step stool that has a grab bar. Keep electrical cords out of the way. Do not use floor polish or wax that makes floors slippery. If you must use wax, use non-skid floor wax. Do not have throw rugs and other things on the floor that can make you trip. What can I do with my stairs? Do not leave any items on the stairs. Make sure that there are handrails on both sides of the stairs and use them. Fix handrails that are broken or loose. Make sure that handrails are as long as the stairways. Check any carpeting to make sure that it is firmly attached to the stairs. Fix any carpet that is loose or worn. Avoid having throw rugs at the top or bottom of the stairs. If you do have throw rugs, attach them to the floor with carpet tape. Make sure that you have a light switch at the top of the stairs and the bottom of the stairs. If you do not have them, ask someone to add them for you. What else can I do to help prevent falls? Wear shoes that: Do not have high heels. Have rubber bottoms. Are comfortable and fit you well. Are closed at the toe. Do not wear sandals. If you use a stepladder: Make sure that it is fully opened. Do not climb a closed stepladder. Make sure that both sides of the stepladder are locked into place. Ask someone to hold it for you, if possible. Clearly mark and make sure that you can see: Any grab bars or handrails. First and last steps. Where the edge of each step is. Use tools that help you move around (mobility aids) if they are needed. These include: Canes. Walkers. Scooters. Crutches. Turn on the lights when you go into a dark area. Replace any light bulbs as soon as they burn out. Set up your furniture so you have a clear path. Avoid moving your furniture around. If any of your floors are uneven, fix them. If there are any pets around you, be aware of where they are. Review your medicines with your doctor. Some medicines can  make you feel dizzy. This can increase your chance of falling. Ask your doctor what other things that you can do to help prevent falls. This information is not intended to replace advice given to you by your health care provider. Make sure you discuss any questions you have with your health care provider. Document Released: 05/26/2009 Document Revised: 01/05/2016 Document Reviewed: 09/03/2014 Elsevier Interactive Patient Education  2017 [employer].

## 2021-07-21 ENCOUNTER — Telehealth: Payer: Self-pay

## 2021-07-21 ENCOUNTER — Telehealth: Payer: Medicare Other

## 2021-07-21 NOTE — Telephone Encounter (Signed)
  Care Management   Follow Up Note   07/21/2021 Name: [name] [name] MRN: [id number] DOB: November 14, 1955   Referred by: [name], MD Reason for referral : Chronic Care Management (RNCM: Follow up Outreach Chronic Care Management and coordination needs-unsuccessful)   An unsuccessful telephone outreach was attempted today. The patient was referred to the case management team for assistance with care management and care coordination.   Follow Up Plan: The care management team will reach out to the patient again over the next 30 days.  Peter Garter RN, Jackquline Denmark, CDE Care Management Coordinator [facility] [phone], Mobile [phone]

## 2021-07-24 NOTE — Telephone Encounter (Signed)
Rescheduled

## 2021-08-04 ENCOUNTER — Telehealth: Payer: Medicare Other

## 2021-08-04 ENCOUNTER — Telehealth: Payer: Self-pay

## 2021-08-04 NOTE — Chronic Care Management (AMB) (Signed)
°  Care Management   Note  08/04/2021 Name: Keylen Eckenrode MRN: [id number] DOB: 25-Sep-1955  Marcellino Fidalgo is a 65 y.o. year old male who is a primary care patient of [name], MD and is actively engaged with the care management team. I reached out to Loura Back by phone today to assist with re-scheduling a follow up visit with the RN Case Manager  Follow up plan: Unsuccessful telephone outreach attempt made.  The care management team will reach out to the patient again over the next 7 days.  If patient returns call to provider office, please advise to call [facility]  [facility] [place], [place], [place], [place] [address] Direct Dial: [phone] [email or web address]: [email or web address]

## 2021-08-18 NOTE — Chronic Care Management (AMB) (Signed)
°  Care Management   Note  08/18/2021 Name: [name] [name] MRN: [id number] DOB: 13-Apr-1956  [name] is a 66 y.o. year old male who is a primary care patient of [name], MD and is actively engaged with the care management team. I reached out to Loura Back by phone today to assist with re-scheduling a follow up visit with the RN Case Manager  Follow up plan: Telephone appointment with care management team member scheduled for:08/25/2021  Noreene Larsson, Home, [facility]  [place], [place] [address] Direct Dial: [phone] [email or web address]: [email or web address]

## 2021-08-25 ENCOUNTER — Ambulatory Visit (INDEPENDENT_AMBULATORY_CARE_PROVIDER_SITE_OTHER): Payer: Medicare Other

## 2021-08-25 DIAGNOSIS — I5032 Chronic diastolic (congestive) heart failure: Secondary | ICD-10-CM

## 2021-08-25 DIAGNOSIS — I4891 Unspecified atrial fibrillation: Secondary | ICD-10-CM

## 2021-08-25 DIAGNOSIS — E1165 Type 2 diabetes mellitus with hyperglycemia: Secondary | ICD-10-CM

## 2021-08-25 DIAGNOSIS — I10 Essential (primary) hypertension: Secondary | ICD-10-CM

## 2021-08-25 DIAGNOSIS — E7800 Pure hypercholesterolemia, unspecified: Secondary | ICD-10-CM

## 2021-08-25 DIAGNOSIS — I25110 Atherosclerotic heart disease of native coronary artery with unstable angina pectoris: Secondary | ICD-10-CM

## 2021-08-25 NOTE — Chronic Care Management (AMB) (Signed)
Chronic Care Management   CCM RN Visit Note  08/25/2021 Name: [name] [name] MRN: [id number] DOB: 1956-07-05  Subjective: [name] [name] is a 66 y.o. year old male who is a primary care patient of [name], MD. The care management team was consulted for assistance with disease management and care coordination needs.    Engaged with patient by telephone for follow up visit in response to provider referral for case management and/or care coordination services.   Consent to Services:  The patient was given information about Chronic Care Management services, agreed to services, and gave verbal consent prior to initiation of services.  Please see initial visit note for detailed documentation.   Patient agreed to services and verbal consent obtained.   Assessment: Review of patient past medical history, allergies, medications, health status, including review of consultants reports, laboratory and other test data, was performed as part of comprehensive evaluation and provision of chronic care management services.   SDOH (Social Determinants of Health) assessments and interventions performed:    CCM Care Plan  Allergies  Allergen Reactions   Shrimp [Shellfish Allergy] Nausea And Vomiting   Ace Inhibitors Cough   Other Hives and Other (See Comments)    Patient reports developing hives after receiving "some antibiotic given in 1980''s at [facility]". He does not know which antibiotic.    Outpatient Encounter Medications as of 08/25/2021  Medication Sig   ACCU-CHEK SOFTCLIX LANCETS lancets Use as instructed for three times daily testing of blood glucose   acetaminophen (TYLENOL) 500 MG tablet Take 1,000 mg by mouth every 6 (six) hours as needed (pain).   albuterol (VENTOLIN HFA) 108 (90 Base) MCG/ACT inhaler Inhale 1-2 puffs into the lungs every 6 (six) hours as needed for wheezing or shortness of breath.   amLODipine (NORVASC) 10 MG tablet Take 1 tablet (10  mg total) by mouth daily.   apixaban (ELIQUIS) 5 MG TABS tablet Take 1 tablet (5 mg total) by mouth 2 (two) times daily.   atorvastatin (LIPITOR) 80 MG tablet Take 1 tablet (80 mg total) by mouth daily.   blood glucose meter kit and supplies KIT Dispense based on patient and insurance preference. Use up to four times daily as directed. (FOR ICD-9 250.00, 250.01).   Blood Glucose Monitoring Suppl (ACCU-CHEK AVIVA PLUS) w/Device KIT Use as directed for 3 times daily testing of blood glucose   carvedilol (COREG) 25 MG tablet TAKE 1 TABLET BY MOUTH TWICE DAILY WITH A MEAL   colchicine 0.6 MG tablet On day 1 take 2 tablets at first sign of gout flare.  Take 1 tablet 1 hour later.  Then take 1 tablet daily starting the next day until resolution of gout flare.   empagliflozin (JARDIANCE) 10 MG TABS tablet Take 1 tablet (10 mg total) by mouth daily before breakfast.   eplerenone (INSPRA) 25 MG tablet Take 1 tablet (25 mg total) by mouth daily.   glucose blood (ACCU-CHEK AVIVA PLUS) test strip Use as instructed for 3 times daily testing of blood glucose   guaiFENesin (MUCINEX PO) Take 1 tablet by mouth 2 (two) times daily as needed (cough/congestion).    hydrALAZINE (APRESOLINE) 50 MG tablet TAKE 1 & 1/2 (ONE & ONE-HALF) TABLETS BY MOUTH TWICE DAILY. MUST MAKE FOLLOW UP APPT FOR FURTHER REFILLS   insulin glargine (LANTUS SOLOSTAR) 100 UNIT/ML Solostar Pen Inject 40 Units into the skin every morning. And pen needles 1/day   Insulin Pen Needle (PEN NEEDLES) 32G X 4  MM MISC 1 each by Does not apply route daily.   isosorbide mononitrate (IMDUR) 120 MG 24 hr tablet Take 1 tablet (120 mg total) by mouth daily.   Lancet Devices (ACCU-CHEK SOFTCLIX) lancets Use as instructed for 3 times daily testing of blood glucose   meclizine (ANTIVERT) 12.5 MG tablet Take 1 tablet (12.5 mg total) by mouth 3 (three) times daily as needed for dizziness.   metFORMIN (GLUCOPHAGE) 1000 MG tablet Take 1 tablet (1,000 mg total) by  mouth daily with breakfast. TAKE 1 TABLET BY MOUTH TWICE DAILY WITH A MEAL   nitroGLYCERIN (NITROSTAT) 0.4 MG SL tablet Place 1 tablet (0.4 mg total) under the tongue every 5 (five) minutes as needed for chest pain (x 3 tabs daily).   sotalol (BETAPACE) 120 MG tablet Take 1 tablet (120 mg total) by mouth 2 (two) times daily.   torsemide (DEMADEX) 20 MG tablet Take 4 tablets (80 mg total) by mouth 2 (two) times daily.   TRUEPLUS LANCETS 28G MISC 1 each by Does not apply route 3 (three) times daily.   No facility-administered encounter medications on file as of 08/25/2021.    Patient Active Problem List   Diagnosis Date Noted   Syncope 02/09/2020   Generalized weakness 06/27/2019   Acute on chronic congestive heart failure with left ventricular diastolic dysfunction ([place]) 06/26/2019   Anemia due to chronic kidney disease 06/26/2019   CKD (chronic kidney disease), stage III ([place]) 06/26/2019   Type 2 diabetes mellitus without complication ([place]) 62/37/6283   Iron deficiency anemia due to chronic blood loss 06/26/2019   History of atrial fibrillation    History of type 2 diabetes mellitus    Anemia    Chest pain 04/24/2019   Overweight (BMI 25.0-29.9) 02/28/2017   Unstable angina ([place]) 08/03/2016   S/P laparoscopic cholecystectomy 02/07/2016   Coronary artery disease involving native coronary artery with unstable angina pectoris ([place]) 12/29/2015   Diabetes ([place]) 11/12/2015   Chronic anticoagulation 08/22/2015   Atrial flutter with controlled response ([place]) 08/22/2015   RUQ pain    Cholelithiases- drain in place 06/04/2015   Essential hypertension    Chronic diastolic CHF (congestive heart failure) ([place]) 04/24/2015   Excessive daytime sleepiness 11/25/2014   Obstructive sleep apnea 11/25/2014   NICM (nonischemic cardiomyopathy) ([place]) 03/11/2014   Anticoagulation goal of INR 2 to 3 10/19/2013   Eczema 10/19/2013   Low libido 10/16/2013   Persistent atrial fibrillation ([place]) 10/06/2013    Rectal bleeding 12/18/2011   HYPERCHOLESTEROLEMIA 07/11/2010   DENTAL CARIES 06/30/2010   GOUT 05/05/2009   HEPATITIS B, CHRONIC 12/30/2006   OSTEOCHONDROMA 12/30/2006   Mitral valve disorder 12/30/2006   LIVER FUNCTION TESTS, ABNORMAL, HX OF 12/30/2006   COLONIC POLYPS, HX OF 12/30/2006   COLONOSCOPY, HX OF 05/12/2001    Conditions to be addressed/monitored:Atrial Fibrillation, CHF, CAD, HTN, HLD, and DMII  Care Plan : Cardiovascular disease  (HF,CAD, AF, HTN and HLD)  Updates made by Dimitri Ped, RN since 08/25/2021 12:00 AM     Problem: Lack of long term self management plan for Cardiovascular disease  (HF,CAD, AF, HTN and HLD)   Priority: High     Goal: Symptom Exacerbation Prevented or Minimized   Start Date: 04/28/2021  Expected End Date: 10/11/2021  Recent Progress: Not on track  Priority: High  Note:   Current Barriers:  Knowledge deficits related to basic Cardiovascular disease  (HF,CAD, AF, HTN and HLD) pathophysiology and self care management Unable to independently self manage Cardiovascular  disease  (HF,CAD, AF, HTN and HLD) Does not adhere to provider recommendations re:  Does not adhere to prescribed medication regimen States he does not weigh daily and he has not been sending his CardioMem reading daily.  States his weight has been staying stable from 212-213.  Denies any increase in shortness of breath, swelling or chest pains.   States he does forget to take his PM medications about 2-3 times a week.  States he does not use a pill box but he does have a pill box. States he does eat fast food 2-3 times a week such as [employer] or [employer] when he is keeping his grandson. Nurse Case Manager Clinical Goal(s):  patient will weigh self daily and record patient will verbalize understanding of Heart Failure Action Plan and when to call doctor patient will take all Heart Failure mediations as prescribed Interventions:  Collaboration with [name], MD  regarding development and update of comprehensive plan of care as evidenced by provider attestation and co-signature Inter-disciplinary care team collaboration (see longitudinal plan of care) Basic overview and discussion of pathophysiology of Cardiovascular disease  (HF,CAD, AF, HTN and HLD) Provided written and verbal education on low sodium diet Reviewed Heart Failure Action Plan in depth and provided written copy-mailed [place]  calendar with action plan Assessed for scales in home-has scales Discussed importance of daily weight Reviewed role of diuretics in prevention of fluid overload Reived to try to send in his CardioMem dialy Reviewed to try to avoid fast food with higher sodium  Reivewed to try filling his medications in his pill box to see if that will help him remember to take his evening pills Reviewed to try to set an alarm on his phone to take his PM pills or have a family member remind him to take his medications Self-Care Activities:  Takes Heart Failure Medications as prescribed Weighs daily and record (notifying MD of 3 lb weight gain over night or 5 lb in a week) Verbalizes understanding of and follows CHF Action Plan Adheres to low sodium diet  Patient Goals:  - Take Heart Failure Medications as prescribed - Weigh daily and record (notify MD with 3 lb weight gain over night or 5 lb in a week) - Follow CHF Action Plan - Adhere to low sodium diet - begin a heart failure diary - develop a rescue plan - eat more whole grains, fruits and vegetables, lean meats and healthy fats - follow rescue plan if symptoms flare-up - know when to call the doctor - track symptoms and what helps feel better or worse - dress right for the weather, hot or cold - pace activity allowing for rest - call office if I gain more than 2 pounds in one day or 5 pounds in one week - keep legs up while sitting - track weight in diary - use salt in moderation - watch for swelling  in feet, ankles and legs every day - weigh myself daily - call for medicine refill 2 or 3 days before it runs out - keep a list of all the medicines I take; vitamins and herbals too - learn to read medicine labels - use a pillbox to sort medicine - use an alarm clock or phone to remind me to take my medicine Follow Up Plan: Telephone follow up appointment with care management team member scheduled for: 07/21/21 The patient has been provided with contact information for the care management team and has been advised to call  with any health related questions or concerns.      Care Plan : RN Care Manager Plan of Care  Updates made by Dimitri Ped, RN since 08/25/2021 12:00 AM     Problem: Chronic Disease Management and Care Coordination Needs (CHF,CAD,Atrial Fib, DMHTN and HLD)   Priority: High     Long-Range Goal: Establish Plan of Care for Chronic Disease Management Needs (CHF,CAD,Atrial Fib, DMHTN and HLD)   Start Date: 08/25/2021  Expected End Date: 08/22/2022  Priority: High  Note:   Current Barriers:  Knowledge Deficits related to plan of care for management of Atrial Fibrillation, CHF, CAD, HTN, HLD, and DMII  Care Coordination needs related to Lacks knowledge of community resource: home repair Chronic Disease Management support and education needs related to Atrial Fibrillation, CHF, CAD, HTN, HLD, and DMII  States he has not been weighing daily and he has not been sending his CardioMem reading daily.  States he does have  shortness of breath with exertion.  Denies any swelling or chest pains.   States he still sometimes forgets to take his PM medications usually about 2-3 times a week.  States he does not use a pill box but he does have a pill box. Declines pharmacy referral. States he does eat fast food 2-3 times a week such as [employer] or [employer] when he is keeping his grandson.  States he has been drinking more fluids because he has had a dry mouth.  States his B/P was lower but  can not recall number when he checked it last week.  States he has not been checking his CBG regularly but last time he checked it was 276 after eating. States he has not called about getting his window leak repaired yet   RNCM Clinical Goal(s):  Patient will verbalize understanding of plan for management of Atrial Fibrillation, CHF, CAD, HTN, HLD, and DMII as evidenced by voiced adherence to plan of care verbalize basic understanding of  Atrial Fibrillation, CHF, CAD, HTN, HLD, and DMII disease process and self health management plan as evidenced by voiced understanding and teach back take all medications exactly as prescribed and will call provider for medication related questions as evidenced by dispense report and pt verbalization attend all scheduled medical appointments: Dr. Aundra Dubin 08/28/21 as evidenced by medical records demonstrate Improved adherence to prescribed treatment plan for Atrial Fibrillation, CHF, CAD, HTN, HLD, and DMII as evidenced by readings within limits, voiced adherence to plan of care continue to work with RN Care Manager to address care management and care coordination needs related to  Atrial Fibrillation, CHF, CAD, HTN, HLD, and DMII as evidenced by adherence to CM Team Scheduled appointments work with [employer] care guide to address needs related to  [employer] knowledge of community resource: home repairs as evidenced by patient and/or community resource care guide support through collaboration with Consulting civil engineer, provider, and care team.   Interventions: 1:1 collaboration with primary care provider regarding development and update of comprehensive plan of care as evidenced by provider attestation and co-signature Inter-disciplinary care team collaboration (see longitudinal plan of care) Evaluation of current treatment plan related to  self management and patient's adherence to plan as established by provider   AFIB Interventions: (Status:  Goal on track:  Yes.)  Long Term Goal   Counseled on increased risk of stroke due to Afib and benefits of anticoagulation for stroke prevention Reviewed importance of adherence to anticoagulant exactly as prescribed Counseled on bleeding risk associated with Eliquis  and importance of self-monitoring for signs/symptoms of bleeding Counseled on avoidance of NSAIDs due to increased bleeding risk with anticoagulants   CAD Interventions: (Status:  Goal on track:  Yes.) Long Term Goal Assessed understanding of CAD diagnosis Medications reviewed including medications utilized in CAD treatment plan Provided education on importance of blood pressure control in management of CAD Provided education on Importance of limiting foods high in cholesterol Reviewed Importance of taking all medications as prescribed Advised to report any changes in symptoms or exercise tolerance   Heart Failure Interventions:  (Status:  Goal on track:  NO.) Long Term Goal Basic overview and discussion of pathophysiology of Heart Failure reviewed Provided education on low sodium diet Reviewed Heart Failure Action Plan in depth and provided written copy Discussed importance of daily weight and advised patient to weigh and record daily Reviewed role of diuretics in prevention of fluid overload and management of heart failure; Reviewed to send in Cardiomem daily. Reviewed importance of keeping appointment with HF clinic on 08/28/21.  Reinforced s/sx of HF to call provider. Reviewed to discuss with Dr.McLean how much fluid he should drink   Diabetes Interventions:  (Status:  Goal on track:  NO.) Long Term Goal Assessed patient's understanding of A1c goal: <7% Provided education to patient about basic DM disease process Reviewed medications with patient and discussed importance of medication adherence Counseled on importance of regular laboratory monitoring as prescribed Provided patient with written educational materials related to hypo and  hyperglycemia and importance of correct treatment Advised patient, providing education and rationale, to check cbg daily and record, calling provider for findings outside established parameters Referral made to community resources care guide team for assistance with pt has numbers to call to get window repaired. Reinforced to call numbers to see about getting repairs made Reviewed medications and pt has not been taking Jardiance. Reviewed to call pharmacy to get refill  Lab Results  Component Value Date   HGBA1C 8.7 (A) 05/05/2021   Hyperlipidemia Interventions:  (Status:  Goal on track:  Yes.) Long Term Goal Medication review performed; medication list updated in electronic medical record.  Provider established cholesterol goals reviewed Counseled on importance of regular laboratory monitoring as prescribed Reviewed role and benefits of statin for ASCVD risk reduction  Hypertension Interventions:  (Status:  Goal on track:  Yes.) Long Term Goal Last practice recorded BP readings:  BP Readings from Last 3 Encounters:  07/19/21 139/62  06/21/21 (!) 150/88  05/24/21 (!) 147/90  Most recent eGFR/CrCl: No results found for: EGFR  No components found for: CRCL  Evaluation of current treatment plan related to hypertension self management and patient's adherence to plan as established by provider Provided education to patient re: stroke prevention, s/s of heart attack and stroke Advised patient, providing education and rationale, to monitor blood pressure daily and record, calling PCP for findings outside established parameters  Patient Goals/Self-Care Activities: Take all medications as prescribed Attend all scheduled provider appointments Call pharmacy for medication refills 3-7 days in advance of running out of medications Call provider office for new concerns or questions  call office if I gain more than 2 pounds in one day or 5 pounds in one week keep legs up while sitting watch for  swelling in feet, ankles and legs every day weigh myself daily track symptoms and what helps feel better or worse Send Cardiomem reading daily schedule appointment with eye doctor check blood sugar at prescribed times: once daily and when you have symptoms of low  or high blood sugar check feet daily for cuts, sores or redness take the blood sugar log to all doctor visits fill half of plate with vegetables limit fast food meals to no more than 1 per week manage portion size switch to sugar-free drinks wash and dry feet carefully every day check pulse (heart) rate once a day make a plan to eat healthy take medicine as prescribed check blood pressure daily choose a place to take my blood pressure (home, clinic or office, retail store) keep a blood pressure log take blood pressure log to all doctor appointments take medications for blood pressure exactly as prescribed eat more whole grains, fruits and vegetables, lean meats and healthy fats limit salt intake to 2076m/day call for medicine refill 2 or 3 days before it runs out take all medications exactly as prescribed call doctor with any symptoms you believe are related to your medicine  Follow Up Plan:  Telephone follow up appointment with care management team member scheduled for:  09/29/21 The patient has been provided with contact information for the care management team and has been advised to call with any health related questions or concerns.       Plan:Telephone follow up appointment with care management team member scheduled for:  09/29/21 The patient has been provided with contact information for the care management team and has been advised to call with any health related questions or concerns.  MPeter GarterRN, BJackquline Denmark CDE Care Management Coordinator [facility] [phone] Mobile [phone]

## 2021-08-25 NOTE — Patient Instructions (Addendum)
Visit Information  Thank you for taking time to visit with me today. Please don't hesitate to contact me if I can be of assistance to you before our next scheduled telephone appointment.  Following are the goals we discussed today:  Take all medications as prescribed Attend all scheduled provider appointments Call pharmacy for medication refills 3-7 days in advance of running out of medications Call provider office for new concerns or questions  call office if I gain more than 2 pounds in one day or 5 pounds in one week keep legs up while sitting watch for swelling in feet, ankles and legs every day weigh myself daily track symptoms and what helps feel better or worse Send Cardiomem reading daily schedule appointment with eye doctor check blood sugar at prescribed times: once daily and when you have symptoms of low or high blood sugar check feet daily for cuts, sores or redness take the blood sugar log to all doctor visits fill half of plate with vegetables limit fast food meals to no more than 1 per week manage portion size switch to sugar-free drinks wash and dry feet carefully every day check pulse (heart) rate once a day make a plan to eat healthy take medicine as prescribed check blood pressure daily choose a place to take my blood pressure (home, clinic or office, retail store) keep a blood pressure log take blood pressure log to all doctor appointments take medications for blood pressure exactly as prescribed eat more whole grains, fruits and vegetables, lean meats and healthy fats limit salt intake to 2000mg /day call for medicine refill 2 or 3 days before it runs out take all medications exactly as prescribed call doctor with any symptoms you believe are related to your medicine Heart Failure, Self-Care Heart failure is a serious condition. The following information explains things you need to do to take care of yourself at home. To help you stay as healthy as possible,  you may be asked to change your diet, take certain medicines, and make other changes in your life. Your doctor may also give you more specific instructions. If you have problems or questions, call your doctor. What are the risks? Having heart failure makes it more likely for you to have some problems. These problems can get worse if you do not take good care of yourself. Problems may include: Damage to the kidneys, liver, or lungs. Malnutrition. Abnormal heart rhythms. Blood clotting problems that could cause a stroke. Supplies needed: Scale for weighing yourself. Blood pressure monitor. Notebook. Medicines. How to care for yourself when you have heart failure Medicines Take over-the-counter and prescription medicines only as told by your doctor. Take your medicines every day. Do not stop taking your medicine unless your doctor tells you to do so. Do not skip any medicines. Get your prescriptions refilled before you run out of medicine. This is important. Talk with your doctor if you cannot afford your medicines. Eating and drinking  Eat heart-healthy foods. Talk with a diet specialist (dietitian) to create an eating plan. Limit salt (sodium) if told by your doctor. Ask your diet specialist to tell you which seasonings are healthy for your heart. Cook in healthy ways instead of frying. Healthy ways of cooking include roasting, grilling, broiling, baking, poaching, steaming, and stir-frying. Choose foods that: Have no trans fat. Are low in saturated fat and cholesterol. Choose healthy foods, such as: Fresh or frozen fruits and vegetables. Fish. Low-fat (lean) meats. Legumes, such as beans, peas, and lentils. Fat-free or  low-fat dairy products. Whole-grain foods. High-fiber foods. Limit how much fluid you drink, if told by your doctor. Alcohol use Do not drink alcohol if: Your doctor tells you not to drink. Your heart was damaged by alcohol, or you have very bad heart  failure. You are pregnant, may be pregnant, or are planning to become pregnant. If you drink alcohol: Limit how much you have to: 0-1 drink a day for women. 0-2 drinks a day for men. Know how much alcohol is in your drink. In the [place] one drink equals one 12 oz bottle of beer (355 mL), one 5 oz glass of wine (148 mL), or one 1 oz glass of hard liquor (44 mL). Lifestyle  Do not smoke or use any products that contain nicotine or tobacco. If you need help quitting, ask your doctor. Do not use nicotine gum or patches before talking to your doctor. Do not use illegal drugs. Lose weight if told by your doctor. Do physical activity if told by your doctor. Talk to your doctor before you begin an exercise if: You are an older adult. You have very bad heart failure. Learn to manage stress. If you need help, ask your doctor. Get physical rehab (rehabilitation) to help you stay independent and to help with your quality of life. Participate in a cardiac rehab program. This program helps you improve your health through exercise, education, and counseling. Plan time to rest when you get tired. Check weight and blood pressure  Weigh yourself every day. This will help you to know if fluid is building up in your body. Weigh yourself every morning after you pee (urinate) and before you eat breakfast. Wear the same amount of clothing each time. Write down your daily weight. Give your record to your doctor. Check and write down your blood pressure as told by your doctor. Check your pulse as told by your doctor. Dealing with very hot and very cold weather If it is very hot: Avoid activities that take a lot of energy. Use air conditioning or fans, or find a cooler place. Avoid caffeine and alcohol. Wear clothing that is loose-fitting, lightweight, and light-colored. If it is very cold: Avoid activities that take a lot of energy. Layer your clothes. Wear mittens or gloves, a hat, and a face covering  when you go outside. Avoid alcohol. Follow these instructions at home: Stay up to date with shots (vaccines). Get pneumococcal and flu (influenza) shots. Keep all follow-up visits. Contact a doctor if: You gain 2-3 lb (1-1.4 kg) in 24 hours or 5 lb (2.3 kg) in a week. You have increasing shortness of breath. You cannot do your normal activities. You get tired easily. You cough a lot. You do not feel like eating or feel like you may vomit (nauseous). You have swelling in your hands, feet, ankles, or belly (abdomen). You cannot sleep well because it is hard to breathe. You feel like your heart is beating fast (palpitations). You get dizzy when you stand up. You feel depressed or sad. Get help right away if: You have trouble breathing. You or someone else notices a change in your behavior, such as having trouble staying awake. You have chest pain or discomfort. You pass out (faint). These symptoms may be an emergency. Get help right away. Call your local emergency services (911 in the [place] Do not wait to see if the symptoms will go away. Do not drive yourself to the hospital. Summary Heart failure is a serious condition. To care  for yourself, you may have to change your diet, take medicines, and make other lifestyle changes. Take your medicines every day. Do not stop taking them unless your doctor tells you to do so. Limit salt and eat heart-healthy foods. Ask your doctor if you can drink alcohol. You may have to stop alcohol use if you have very bad heart failure. Contact your doctor if you gain weight quickly or feel that your heart is beating too fast. Get help right away if you pass out or have chest pain or trouble breathing. This information is not intended to replace advice given to you by your health care provider. Make sure you discuss any questions you have with your health care provider. Document Revised: 02/20/2020 Document Reviewed: 02/20/2020 Elsevier Patient Education   [place] next appointment is by telephone on 09/29/21 at 9:45 AM  Please call the care guide team at [phone] if you need to cancel or reschedule your appointment.   If you are experiencing a Mental Health or [place] or need someone to talk to, please call the Suicide and Crisis Lifeline: 988 call the [place] National Suicide Prevention Lifeline: (214)219-1268 or TTY: 323-102-4297 TTY 740-040-5642) to talk to a trained counselor call 1-800-273-TALK (toll free, 24 hour hotline) go to [facility] [address], [place] [phone]) call 911   Patient verbalizes understanding of instructions and care plan provided today and agrees to view in [place]. Active MyChart status confirmed with patient.   Peter Garter RN, Jackquline Denmark, CDE Care Management Coordinator [facility] [phone], Mobile [phone]

## 2021-08-28 ENCOUNTER — Telehealth (HOSPITAL_COMMUNITY): Payer: Self-pay

## 2021-08-28 ENCOUNTER — Ambulatory Visit (HOSPITAL_COMMUNITY)
Admission: RE | Admit: 2021-08-28 | Discharge: 2021-08-28 | Disposition: A | Discharge status: Home / Self Care | Payer: Medicare Other | Source: Ambulatory Visit | Attending: Cardiology | Admitting: Cardiology

## 2021-08-28 ENCOUNTER — Other Ambulatory Visit: Payer: Self-pay

## 2021-08-28 ENCOUNTER — Encounter (HOSPITAL_COMMUNITY): Payer: Self-pay | Admitting: Cardiology

## 2021-08-28 VITALS — BP 110/70 | HR 79 | Wt 207.0 lb

## 2021-08-28 DIAGNOSIS — Z7984 Long term (current) use of oral hypoglycemic drugs: Secondary | ICD-10-CM | POA: Insufficient documentation

## 2021-08-28 DIAGNOSIS — E1122 Type 2 diabetes mellitus with diabetic chronic kidney disease: Secondary | ICD-10-CM | POA: Insufficient documentation

## 2021-08-28 DIAGNOSIS — G4733 Obstructive sleep apnea (adult) (pediatric): Secondary | ICD-10-CM | POA: Diagnosis not present

## 2021-08-28 DIAGNOSIS — Z7901 Long term (current) use of anticoagulants: Secondary | ICD-10-CM | POA: Diagnosis not present

## 2021-08-28 DIAGNOSIS — Z79899 Other long term (current) drug therapy: Secondary | ICD-10-CM | POA: Insufficient documentation

## 2021-08-28 DIAGNOSIS — I4892 Unspecified atrial flutter: Secondary | ICD-10-CM | POA: Insufficient documentation

## 2021-08-28 DIAGNOSIS — I5032 Chronic diastolic (congestive) heart failure: Secondary | ICD-10-CM | POA: Insufficient documentation

## 2021-08-28 DIAGNOSIS — Z794 Long term (current) use of insulin: Secondary | ICD-10-CM | POA: Diagnosis not present

## 2021-08-28 DIAGNOSIS — I25119 Atherosclerotic heart disease of native coronary artery with unspecified angina pectoris: Secondary | ICD-10-CM | POA: Insufficient documentation

## 2021-08-28 DIAGNOSIS — N183 Chronic kidney disease, stage 3 unspecified: Secondary | ICD-10-CM | POA: Diagnosis not present

## 2021-08-28 DIAGNOSIS — I480 Paroxysmal atrial fibrillation: Secondary | ICD-10-CM | POA: Diagnosis not present

## 2021-08-28 DIAGNOSIS — Z955 Presence of coronary angioplasty implant and graft: Secondary | ICD-10-CM | POA: Diagnosis not present

## 2021-08-28 DIAGNOSIS — E785 Hyperlipidemia, unspecified: Secondary | ICD-10-CM | POA: Insufficient documentation

## 2021-08-28 DIAGNOSIS — I130 Hypertensive heart and chronic kidney disease with heart failure and stage 1 through stage 4 chronic kidney disease, or unspecified chronic kidney disease: Secondary | ICD-10-CM | POA: Insufficient documentation

## 2021-08-28 LAB — LIPID PANEL
Cholesterol: 115 mg/dL (ref 0–200)
HDL: 45 mg/dL (ref >40)
LDL Cholesterol: 50 mg/dL (ref 0–99)
Total CHOL/HDL Ratio: 2.6 RATIO
Triglycerides: 98 mg/dL (ref <150)
VLDL: 20 mg/dL (ref 0–40)

## 2021-08-28 LAB — BASIC METABOLIC PANEL
Anion gap: 13 (ref 5–15)
BUN: 11 mg/dL (ref 8–23)
CO2: 25 mmol/L (ref 22–32)
Calcium: 9.4 mg/dL (ref 8.9–10.3)
Chloride: 94 mmol/L — ABNORMAL LOW (ref 98–111)
Creatinine, Ser: 1.31 mg/dL — ABNORMAL HIGH (ref 0.61–1.24)
GFR, Estimated: 60 mL/min — ABNORMAL LOW (ref >60)
Glucose, Bld: 452 mg/dL — ABNORMAL HIGH (ref 70–99)
Potassium: 3.9 mmol/L (ref 3.5–5.1)
Sodium: 132 mmol/L — ABNORMAL LOW (ref 135–145)

## 2021-08-28 NOTE — Progress Notes (Signed)
Patient ID: [name] [name], male   DOB: 1955-11-02, 66 y.o.   MRN: [id number] PCP: [name], MD Cardiology: Dr. Aundra Dubin  66 y.o. with history of HTN, diabetes, paroxysmal atrial fibrillation, and CAD presents for cardiology followup.  He was admitted in 5/13 with chest pain with exertion.  LHC was done showing global HK with EF 45% and diffuse, severe distal and branch vessel disease.  There was no interventional option, but it was suspect that this disease could be causing his anginal-type pain.  Echo at that time was read as showing EF 60-65%.  He was admitted in 5/14 with hypertensive urgency and chest pain.  He ruled out for MI and BP was controlled.  His EF was 50-55% by echo.  ETT-Sestamibi done as outpatient showed small fixed inferior defect with no ischemia but EF was 31%.  He was quite hypertensive during the study.  Cardiac MRI done to confirm EF in 5/14 showed EF 44%.  He had an episode of atrial fibrillation/RVR in 3/15 but went back into NSR.  Echo in 2/16 showed EF 55-60% with moderate diastolic dysfunction.   He had a Cardiolite in 9/16 showing inferior ischemia.  He was having exertional dyspnea with less activity than normal.  There was concern for worsened CAD and he was sent for [facility] in 9/16. By RHC, filling pressures were not significantly elevated. He had a severe distal RCA stenosis that was treated with DES x 2.  Echo showed EF 55-60%.    In 10/16, he was admitted with acute cholecystitis.  He received a cholecystostomy tube.  Cholecystectomy done in 6/17.   Atrial fibrillation in 1/17, cardioverted to NSR.   Recurrent concerning chest pain in 3/17.  Repeat LHC showed diffuse distal and branch vessel disease consistent with poorly-controlled DM, but patent RCA stents.    He was admitted with atrial fibrillation in 12/17.  Started on sotalol and converted to NSR.    He had recurrent chest pain in 5/18 with abnormal Cardiolite.  Cath was done again in 5/18,  showing diffuse CAD with no good interventional options.   Admitted 11/20 with increased dyspnea and exertional chest pain. Hgb went down to 8.1. GI consulted.  Had [place] that showed severe distal and branch vessel disease but no interventional targets and not good anatomy for CABG. Continued on medical management. Capsule endoscopy without recent or active bleeding, no AVMs. He went into atrial flutter during his hospitalization. He went back into NSR, however, after discharge.  +Snoring and daytime sleepiness.   He has had episodes of syncope associated with vigorous coughing.   He was in the ER with atrial flutter and dyspnea in 4/21. Zio monitor in 9/22 showed NSR with PACs.   Today he returns for followup of CHF and CAD.  He is not using his Cardiomems (has not transmitted since 11/22).  He is not taking his medications as he is supposed to take them (not taking an pm doses).  He is drinking a lot of fluid.  His weight is down 5 lbs.  He is stably short of breath after walking about 100 feet.  No chest pain.  No palpitations.  No orthopnea/PND.    ECG (personally reviewed): NSR with 1st degree AVB, PACs, LVH, QTc 472 msec  Labs (5/13): K 3.6, creatinine 0.93, LDL 85, HDL 57 Labs (7/13): K 3.9, creatinine 1.1, LDL 57, HDL 51  Labs (5/14): K 4, creatinine 1.25 Labs (8/14): K 3.7, creatinine 0.9, LDL 103, HDL  42 Labs (8/15): K 3.5 creatinine 1.0, BNP 75 Labs (2/16): K 3.8, creatinine 1.1 Labs (9/16): K 3.5, creatinine 1.03, BNP 59, HDL 40, LDL 111, TGs 333 Labs (11/16): K 3.6, creaitnine 1.07, LFTs normal, LDL 44 Labs (6/17): K 3.4, creatinine 1.52 => 1.13 Labs (1/18): K 3.9, creatinine 1.25 Labs (6/18): K 3.8, creatinine 1.21, HCT 39.7 Labs (8/18): LDL 39, HDL 43 Labs (12/18): 4.4, creatinine 1.25, LFTs normal Labs (8/19): K 3.6, creatinine 1.13, LDL 68, HDL 49  Labs (11/20): K 3.5, creatinine 1.33, LDL 25 Labs (4/21): K 4.0, SCr 1.14, BNP 266  Labs (6/21): K 3.7, creatinine 1.25 Labs  (12/21): LDL 25 Labs (1/22): BNP 132 Labs (2/22): K 3.4, creatinine 1.44 Labs (8/22): LDL 81 Labs (9/22): K 3.2, creatinine 1.53 Labs (10/22): K 3.7, creatinine 1.39 Labs (11/22): K 4.4, creatinine 1.47  PMH: 1. DM2 2. Gout 3. HTN: Cough with ACEI.  4. Cardiomyopathy: Suspect mixed ischemic/nonischemic (?ETOH-related).  EF 35% in 2008.  Echo in 5/13 showed EF 60-65% with moderate LVH but EF was 45% on LV-gram in 5/13.  Echo (5/14) with EF 50-55%, mild LVH, inferobasal HK, mild MR.  ETT-Sestamibi in 5/14, however, showed EF 31%.  Cardiac MRI (6/14) with EF 44%, mild global hypokinesis, subepicardial delayed enhancement in a nonspecific RV insertion site pattern.  Echo (2/16) with EF 55-60%, moderate diastolic dysfunction. RHC (9/16) with mean RA 8, PA 39/20 mean 28, mean PCWP 14, CI 2.2. Echo (9/16) with EF 55-60%, grade II diastolic dysfunction.  - Echo (6/17) with EF 55-60%, moderate LVH, grade II diastolic dysfunction, mild AI, mild MR.  - RHC (11/20): mean RA 4, PA 46/14 mean 26, mean PCWP 13, CI 3.23 - Echo (9/20): EF 60-65%, moderate LVH, normal RV size and systolic function, mild-moderate MR, mild AI.  - Echo (5/21): EF 55%, RV normal - Has Cardiomems.  5. CAD: LHC in 2010 with mild nonobstructive disease.  LHC (5/13) with diffuse distal and branch vessel disease, mild global hypokinesis and EF 45%.  ETT-Sestamibi (5/14): EF 31%, small fixed inferior defect with no ischemia.  Lexiscan Cardiolite (9/16) with EF 26%, moderate inferior defect that was partially reversible, suggesting ischemia => High risk study. LHC (9/16) with 40-50% mLAD, diffuse up to 50% distal LAD, 90% ostium of branch off ramus, 95% dRCA => DES to RCA x 2, EF 55-60%.   - LHC (3/17) with diffuse branch and distal vessel disease c/w poorly controlled DM, RCA stents patent.  - LHC (5/18): Diffuse CAD with no good interventional option. 60% mRCA, 90% dLAD, 50% serial mid ramus stenoses, ramus branches with ostial 80-90%  stenoses. - LHC (11/20): Severe distal and branch vessel disease but no interventional targets and not good anatomy for CABG  6. H/o chronic HBV 7. Osteochondroma left shoulder.  8. Hyperlipidemia 9. Paroxysmal atrial fibrillation: Coumadin.  Developed cough and increased ESR with amiodarone.  DCCV in 1/17.  Recurrent atrial fibrillation in 12/17, sotalol started.  10. Atrial flutter: had ablations in 2007 and 2008.  - Zio patch in 4/21 with 20% atrial flutter.  11. OSA on CPAP.  12. Acute cholecystitis (10/16): Cholecystostomy tube placed.  Cholecystectomy 6/17.  13. CKD 14. Anemia - Suspected GI bleed but EGD/c-scope 9/20 without significant findings.  - Capsule Endoscopy (11/20) without recent or active bleeding, no AVMs.  15. Syncope: Suspected cough syncope.  16. OSA  SH: Married, prior smoker.  Some ETOH, occasionally heavy in past.  Does use occasional marijuana. Out of work Engineer, drilling.  2 daughters in grad school.   FH: CAD  ROS: All systems reviewed and negative except as per HPI.   Current Outpatient Medications  Medication Sig Dispense Refill   ACCU-CHEK SOFTCLIX LANCETS lancets Use as instructed for three times daily testing of blood glucose 100 each 12   acetaminophen (TYLENOL) 500 MG tablet Take 1,000 mg by mouth every 6 (six) hours as needed (pain).     albuterol (VENTOLIN HFA) 108 (90 Base) MCG/ACT inhaler Inhale 1-2 puffs into the lungs every 6 (six) hours as needed for wheezing or shortness of breath. 8 g 0   amLODipine (NORVASC) 10 MG tablet Take 1 tablet (10 mg total) by mouth daily. 90 tablet 3   apixaban (ELIQUIS) 5 MG TABS tablet Take 1 tablet (5 mg total) by mouth 2 (two) times daily. 60 tablet 6   atorvastatin (LIPITOR) 80 MG tablet Take 1 tablet (80 mg total) by mouth daily. 90 tablet 3   blood glucose meter kit and supplies KIT Dispense based on patient and insurance preference. Use up to four times daily as directed. (FOR ICD-9 250.00, 250.01). 1 each 0    Blood Glucose Monitoring Suppl (ACCU-CHEK AVIVA PLUS) w/Device KIT Use as directed for 3 times daily testing of blood glucose 1 kit 0   carvedilol (COREG) 25 MG tablet TAKE 1 TABLET BY MOUTH TWICE DAILY WITH A MEAL 180 tablet 3   colchicine 0.6 MG tablet On day 1 take 2 tablets at first sign of gout flare.  Take 1 tablet 1 hour later.  Then take 1 tablet daily starting the next day until resolution of gout flare. 10 tablet 1   empagliflozin (JARDIANCE) 10 MG TABS tablet Take 1 tablet (10 mg total) by mouth daily before breakfast. 30 tablet 11   eplerenone (INSPRA) 25 MG tablet Take 1 tablet (25 mg total) by mouth daily. 30 tablet 11   glucose blood (ACCU-CHEK AVIVA PLUS) test strip Use as instructed for 3 times daily testing of blood glucose 100 each 12   guaiFENesin (MUCINEX PO) Take 1 tablet by mouth 2 (two) times daily as needed (cough/congestion).      hydrALAZINE (APRESOLINE) 50 MG tablet TAKE 1 & 1/2 (ONE & ONE-HALF) TABLETS BY MOUTH TWICE DAILY. MUST MAKE FOLLOW UP APPT FOR FURTHER REFILLS 90 tablet 5   insulin glargine (LANTUS SOLOSTAR) 100 UNIT/ML Solostar Pen Inject 40 Units into the skin every morning. And pen needles 1/day 45 mL 3   Insulin Pen Needle (PEN NEEDLES) 32G X 4 MM MISC 1 each by Does not apply route daily. 100 each 3   isosorbide mononitrate (IMDUR) 120 MG 24 hr tablet Take 1 tablet (120 mg total) by mouth daily. 30 tablet 6   Lancet Devices (ACCU-CHEK SOFTCLIX) lancets Use as instructed for 3 times daily testing of blood glucose 1 each 0   meclizine (ANTIVERT) 12.5 MG tablet Take 1 tablet (12.5 mg total) by mouth 3 (three) times daily as needed for dizziness. 12 tablet 0   metFORMIN (GLUCOPHAGE) 1000 MG tablet Take 1 tablet (1,000 mg total) by mouth daily with breakfast. TAKE 1 TABLET BY MOUTH TWICE DAILY WITH A MEAL 60 tablet 3   nitroGLYCERIN (NITROSTAT) 0.4 MG SL tablet Place 1 tablet (0.4 mg total) under the tongue every 5 (five) minutes as needed for chest pain (x 3 tabs  daily). 60 tablet 1   sotalol (BETAPACE) 120 MG tablet Take 1 tablet (120 mg total) by mouth 2 (two) times daily. 180 tablet  3   torsemide (DEMADEX) 20 MG tablet Take 4 tablets (80 mg total) by mouth 2 (two) times daily. 240 tablet 11   TRUEPLUS LANCETS 28G MISC 1 each by Does not apply route 3 (three) times daily. 100 each 12   No current facility-administered medications for this encounter.   BP 110/70    Pulse 79    Wt 93.9 kg (207 lb)    SpO2 96%    BMI 28.07 kg/m    Wt Readings from Last 3 Encounters:  08/28/21 93.9 kg (207 lb)  07/19/21 93.9 kg (207 lb 1.6 oz)  06/21/21 96.4 kg (212 lb 9.6 oz)   PHYSICAL EXAM: General: NAD Neck: No JVD, no thyromegaly or thyroid nodule.  Lungs: Clear to auscultation bilaterally with normal respiratory effort. CV: Nondisplaced PMI.  Heart regular S1/S2, no S3/S4, no murmur.  No peripheral edema.  No carotid bruit.  Normal pedal pulses.  Abdomen: Soft, nontender, no hepatosplenomegaly, no distention.  Skin: Intact without lesions or rashes.  Neurologic: Alert and oriented x 3.  Psych: Normal affect. Extremities: No clubbing or cyanosis.  HEENT: Normal.   Assessment/Plan: 1. Chronic diastolic CHF: EF 24% on cardiac MRI in 6/14 but EF improved back to 55-60% on 2/16 echo.  However, EF down to 26% on Cardiolite in 9/16.  Echo in 9/20 showed EF 60-65%, moderate LVH, normal RV, mild-moderate MR.  Echo in 5/21 with EF 55%, normal RV.  He has a Cardiomems device but has not transmitted since 11/22. He is not overloaded on exam, stable NYHA class III symptoms. Weight is down.  - Continue eplerenone 25 mg daily (gynecomastia with spiro in past). BMET today. - Continue torsemide 80 mg bid. Should take the pm dose. - Continue empagliflozin 10 mg daily.  - Continue hydralazine 75 mg bid (take the pm dose) + Imdur 60 mg daily. - I encouraged him to send Cardiomems at least 2-3 times/week.  2. Atrial fibrillation/flutter: Paroxysmal. He has had atrial flutter  ablations x 2 remotely.  He is on sotalol.  QTc ok on ECG today.  He tolerates atrial arrhythmias poorly, seems to worsen CHF.  Zio patch in 4/21 with 20% atrial flutter. He saw EP, repeat ablation was not recommended. Repeat Zio in 9/22 showed only NSR and PADs.  NSR today.  - Continue sotalol, QTc acceptable on ECG today.  Needs to take sotalol bid.  - Continue Eliquis, needs to take bid.  3. Coronary artery disease: LHC in 9/16 showed diffuse CAD, most significant involving the distal RCA.  Patient had DES x 2 to RCA.  Cath in 3/17 with patent RCA stents and diffuse distal and branch vessel disease concerning for poorly controlled diabetes.  Cath in 5/18 again showed diffuse CAD without good interventional target.  Cath in 11/20 showed severe distal and branch vessel disease but no interventional targets and not good anatomy for CABG.  He denies chest pain. - No ASA given Eliquis use.  - Continue atorvastatin 80 mg daily, check lipids today.  - Continue Coreg, amlodipine, and Imdur 60 mg daily for angina control.  - Avoid ranolazine with sotalol use.  4. Hyperlipidemia:  On atorvastatin, check lipids today.   5. Hypertension: BP controlled.  6. OSA: He did a home sleep study and ? is waiting for CPAP => will send message to Dr. Radford Pax.  7. CKD stable 3: BMET today.  8. DM: On SGLT2i + insulin.   Poor compliance.  We discussed how to take medications  and recommended that he make sure of the Cardiomems device. Followup 6 wks with APP.   Loralie Champagne 08/28/2021

## 2021-08-28 NOTE — Telephone Encounter (Addendum)
Pt aware, agreeable, and verbalized understanding will follow up with his PCP   ----- Message from Larey Dresser, MD sent at 08/28/2021  3:38 PM EST ----- Good lipids.  Very high glucose, needs to followup with PCP on this.

## 2021-08-28 NOTE — Patient Instructions (Signed)
USE Cardiomems!  TAKE EVENING MEDS: Coreg, Eliquis, Hydralazine, sotalol, torsemide.  It is important to take all meds as they are prescribed.  Your physician has requested that you have an echocardiogram. Echocardiography is a painless test that uses sound waves to create images of your heart. It provides your doctor with information about the size and shape of your heart and how well your hearts chambers and valves are working. This procedure takes approximately one hour. There are no restrictions for this procedure.  Labs today We will only contact you if something comes back abnormal or we need to make some changes. Otherwise no news is good news!  Your physician recommends that you schedule a follow-up appointment in: 6 weeks with the Nurse Practitioner/Physician Assistant  Please call office at [phone] option [phone] if you have any questions or concerns.   Do the following things EVERYDAY: Weigh yourself in the morning before breakfast. Write it down and keep it in a log. Take your medicines as prescribed Eat low salt foods--Limit salt (sodium) to 2000 mg per day.  Stay as active as you can everyday Limit all fluids for the day to less than 2 liters   At the [facility], you and your health needs are our priority. As part of our continuing mission to provide you with exceptional heart care, we have created designated Provider Care Teams. These Care Teams include your primary Cardiologist (physician) and Advanced Practice Providers (APPs- Physician Assistants and Nurse Practitioners) who all work together to provide you with the care you need, when you need it.   You may see any of the following providers on your designated Care Team at your next follow up: Dr Glori Bickers Dr Haynes Kerns, NP Lyda Jester, [place] [facility] [place], [place] Audry Riles, PharmD   Please be sure to bring in all your medications bottles to every  appointment.

## 2021-08-30 ENCOUNTER — Telehealth: Payer: Self-pay | Admitting: *Deleted

## 2021-08-30 NOTE — Telephone Encounter (Signed)
-----   Message from Sueanne Margarita, MD sent at 08/28/2021  5:39 PM EST ----- There is a note back in May that our office called him 3 times and he did not return calls  Gae Bon can you look into this  Traci ----- Message ----- From: Larey Dresser, MD Sent: 08/28/2021   5:10 PM EST To: Sueanne Margarita, MD  This patient says he is waiting on CPAP (?has been a long time since sleep study), not sure but could you have your staff look into this?

## 2021-08-30 NOTE — Telephone Encounter (Signed)
Reached out to the patient but voicemail was full so I reached out to his daughter Mariel Craft who said she will make sure the patient makes it to his setup appointment. [employer] [facility] has reactivated his order and will call Mariel Craft with appointment details.

## 2021-09-04 ENCOUNTER — Ambulatory Visit (HOSPITAL_COMMUNITY): Admission: RE | Admit: 2021-09-04 | Payer: Medicare Other | Source: Ambulatory Visit

## 2021-09-12 DIAGNOSIS — I10 Essential (primary) hypertension: Secondary | ICD-10-CM | POA: Diagnosis not present

## 2021-09-12 DIAGNOSIS — I5032 Chronic diastolic (congestive) heart failure: Secondary | ICD-10-CM

## 2021-09-12 DIAGNOSIS — E1165 Type 2 diabetes mellitus with hyperglycemia: Secondary | ICD-10-CM | POA: Diagnosis not present

## 2021-09-12 DIAGNOSIS — I4891 Unspecified atrial fibrillation: Secondary | ICD-10-CM

## 2021-09-12 DIAGNOSIS — E7800 Pure hypercholesterolemia, unspecified: Secondary | ICD-10-CM | POA: Diagnosis not present

## 2021-09-12 DIAGNOSIS — I25110 Atherosclerotic heart disease of native coronary artery with unstable angina pectoris: Secondary | ICD-10-CM | POA: Diagnosis not present

## 2021-09-26 DIAGNOSIS — S0181XA Laceration without foreign body of other part of head, initial encounter: Secondary | ICD-10-CM | POA: Diagnosis not present

## 2021-09-27 DIAGNOSIS — G4733 Obstructive sleep apnea (adult) (pediatric): Secondary | ICD-10-CM | POA: Diagnosis not present

## 2021-09-29 ENCOUNTER — Ambulatory Visit (INDEPENDENT_AMBULATORY_CARE_PROVIDER_SITE_OTHER): Payer: Medicare Other

## 2021-09-29 DIAGNOSIS — I4891 Unspecified atrial fibrillation: Secondary | ICD-10-CM

## 2021-09-29 DIAGNOSIS — I10 Essential (primary) hypertension: Secondary | ICD-10-CM

## 2021-09-29 DIAGNOSIS — E1165 Type 2 diabetes mellitus with hyperglycemia: Secondary | ICD-10-CM

## 2021-09-29 DIAGNOSIS — I5032 Chronic diastolic (congestive) heart failure: Secondary | ICD-10-CM

## 2021-09-29 DIAGNOSIS — I25110 Atherosclerotic heart disease of native coronary artery with unstable angina pectoris: Secondary | ICD-10-CM

## 2021-09-29 DIAGNOSIS — E7800 Pure hypercholesterolemia, unspecified: Secondary | ICD-10-CM

## 2021-09-29 NOTE — Chronic Care Management (AMB) (Signed)
Chronic Care Management   CCM RN Visit Note  09/29/2021 Name: [name] [name] MRN: [id number] DOB: Aug 20, 1955  Subjective: [name] [name] is a 66 y.o. year old male who is a primary care patient of [name], MD. The care management team was consulted for assistance with disease management and care coordination needs.    Engaged with patient by telephone for follow up visit in response to provider referral for case management and/or care coordination services.   Consent to Services:  The patient was given information about Chronic Care Management services, agreed to services, and gave verbal consent prior to initiation of services.  Please see initial visit note for detailed documentation.   Patient agreed to services and verbal consent obtained.   Assessment: Review of patient past medical history, allergies, medications, health status, including review of consultants reports, laboratory and other test data, was performed as part of comprehensive evaluation and provision of chronic care management services.   SDOH (Social Determinants of Health) assessments and interventions performed:    CCM Care Plan  Allergies  Allergen Reactions   Shrimp [Shellfish Allergy] Nausea And Vomiting   Ace Inhibitors Cough   Other Hives and Other (See Comments)    Patient reports developing hives after receiving "some antibiotic given in 1980''s at [facility]". He does not know which antibiotic.    Outpatient Encounter Medications as of 09/29/2021  Medication Sig   ACCU-CHEK SOFTCLIX LANCETS lancets Use as instructed for three times daily testing of blood glucose   acetaminophen (TYLENOL) 500 MG tablet Take 1,000 mg by mouth every 6 (six) hours as needed (pain).   albuterol (VENTOLIN HFA) 108 (90 Base) MCG/ACT inhaler Inhale 1-2 puffs into the lungs every 6 (six) hours as needed for wheezing or shortness of breath.   amLODipine (NORVASC) 10 MG tablet Take 1 tablet (10  mg total) by mouth daily.   apixaban (ELIQUIS) 5 MG TABS tablet Take 1 tablet (5 mg total) by mouth 2 (two) times daily.   atorvastatin (LIPITOR) 80 MG tablet Take 1 tablet (80 mg total) by mouth daily.   blood glucose meter kit and supplies KIT Dispense based on patient and insurance preference. Use up to four times daily as directed. (FOR ICD-9 250.00, 250.01).   Blood Glucose Monitoring Suppl (ACCU-CHEK AVIVA PLUS) w/Device KIT Use as directed for 3 times daily testing of blood glucose   carvedilol (COREG) 25 MG tablet TAKE 1 TABLET BY MOUTH TWICE DAILY WITH A MEAL   colchicine 0.6 MG tablet On day 1 take 2 tablets at first sign of gout flare.  Take 1 tablet 1 hour later.  Then take 1 tablet daily starting the next day until resolution of gout flare.   empagliflozin (JARDIANCE) 10 MG TABS tablet Take 1 tablet (10 mg total) by mouth daily before breakfast.   eplerenone (INSPRA) 25 MG tablet Take 1 tablet (25 mg total) by mouth daily.   glucose blood (ACCU-CHEK AVIVA PLUS) test strip Use as instructed for 3 times daily testing of blood glucose   guaiFENesin (MUCINEX PO) Take 1 tablet by mouth 2 (two) times daily as needed (cough/congestion).    hydrALAZINE (APRESOLINE) 50 MG tablet TAKE 1 & 1/2 (ONE & ONE-HALF) TABLETS BY MOUTH TWICE DAILY. MUST MAKE FOLLOW UP APPT FOR FURTHER REFILLS   insulin glargine (LANTUS SOLOSTAR) 100 UNIT/ML Solostar Pen Inject 40 Units into the skin every morning. And pen needles 1/day   Insulin Pen Needle (PEN NEEDLES) 32G X 4  MM MISC 1 each by Does not apply route daily.   isosorbide mononitrate (IMDUR) 120 MG 24 hr tablet Take 1 tablet (120 mg total) by mouth daily.   Lancet Devices (ACCU-CHEK SOFTCLIX) lancets Use as instructed for 3 times daily testing of blood glucose   meclizine (ANTIVERT) 12.5 MG tablet Take 1 tablet (12.5 mg total) by mouth 3 (three) times daily as needed for dizziness.   metFORMIN (GLUCOPHAGE) 1000 MG tablet Take 1 tablet (1,000 mg total) by  mouth daily with breakfast. TAKE 1 TABLET BY MOUTH TWICE DAILY WITH A MEAL   nitroGLYCERIN (NITROSTAT) 0.4 MG SL tablet Place 1 tablet (0.4 mg total) under the tongue every 5 (five) minutes as needed for chest pain (x 3 tabs daily).   sotalol (BETAPACE) 120 MG tablet Take 1 tablet (120 mg total) by mouth 2 (two) times daily.   torsemide (DEMADEX) 20 MG tablet Take 4 tablets (80 mg total) by mouth 2 (two) times daily.   TRUEPLUS LANCETS 28G MISC 1 each by Does not apply route 3 (three) times daily.   No facility-administered encounter medications on file as of 09/29/2021.    Patient Active Problem List   Diagnosis Date Noted   Syncope 02/09/2020   Generalized weakness 06/27/2019   Acute on chronic congestive heart failure with left ventricular diastolic dysfunction ([place]) 06/26/2019   Anemia due to chronic kidney disease 06/26/2019   CKD (chronic kidney disease), stage III ([place]) 06/26/2019   Type 2 diabetes mellitus without complication ([place]) 01/77/9390   Iron deficiency anemia due to chronic blood loss 06/26/2019   History of atrial fibrillation    History of type 2 diabetes mellitus    Anemia    Chest pain 04/24/2019   Overweight (BMI 25.0-29.9) 02/28/2017   Unstable angina ([place]) 08/03/2016   S/P laparoscopic cholecystectomy 02/07/2016   Coronary artery disease involving native coronary artery with unstable angina pectoris ([place]) 12/29/2015   Diabetes ([place]) 11/12/2015   Chronic anticoagulation 08/22/2015   Atrial flutter with controlled response ([place]) 08/22/2015   RUQ pain    Cholelithiases- drain in place 06/04/2015   Essential hypertension    Chronic diastolic CHF (congestive heart failure) ([place]) 04/24/2015   Excessive daytime sleepiness 11/25/2014   Obstructive sleep apnea 11/25/2014   NICM (nonischemic cardiomyopathy) ([place]) 03/11/2014   Anticoagulation goal of INR 2 to 3 10/19/2013   Eczema 10/19/2013   Low libido 10/16/2013   Persistent atrial fibrillation ([place]) 10/06/2013    Rectal bleeding 12/18/2011   HYPERCHOLESTEROLEMIA 07/11/2010   DENTAL CARIES 06/30/2010   GOUT 05/05/2009   HEPATITIS B, CHRONIC 12/30/2006   OSTEOCHONDROMA 12/30/2006   Mitral valve disorder 12/30/2006   LIVER FUNCTION TESTS, ABNORMAL, HX OF 12/30/2006   COLONIC POLYPS, HX OF 12/30/2006   COLONOSCOPY, HX OF 05/12/2001    Conditions to be addressed/monitored:Atrial Fibrillation, CHF, CAD, HTN, HLD, and DMII  Care Plan : RN Care Manager Plan of Care  Updates made by Dimitri Ped, RN since 09/29/2021 12:00 AM     Problem: Chronic Disease Management and Care Coordination Needs (CHF,CAD,Atrial Fib, DMHTN and HLD)   Priority: High     Long-Range Goal: Establish Plan of Care for Chronic Disease Management Needs (CHF,CAD,Atrial Fib, DMHTN and HLD)   Start Date: 08/25/2021  Expected End Date: 08/22/2022  Priority: High  Note:   Current Barriers:  Knowledge Deficits related to plan of care for management of Atrial Fibrillation, CHF, CAD, HTN, HLD, and DMII  Care Coordination needs related to [facility] knowledge  of community resource: home repair Chronic Disease Management support and education needs related to Atrial Fibrillation, CHF, CAD, HTN, HLD, and DMII  States he has not been weighing daily and thinks he has  sent his CardioMem 1-2 times since he saw HF clinic in January.  States he does have a little shortness of breath with exertion.  Denies any swelling or chest pains.  States he has been out of fluid pills for a couple of days but it has been refilled and his daughter is going to pick up today States he still sometimes forgets to take his PM medications usually about 2-3 times a week.  States he does not use a pill box but he does have a pill box. Declines pharmacy referral. States he continues eat fast food 2-3 times a week such as [employer] or [employer] when he is keeping his grandson.   States he is checking his B/P most days with good reading.  States he has not been checking his CBG  regularly. States it was 258 when he went to urgent care a few days ago from a cut above his eye where his grandson accidentally hit him with a toy.  States he has been taking his Jardiance regularly now.States he has gotten his CPAP and has been using. RNCM Clinical Goal(s):  Patient will verbalize understanding of plan for management of Atrial Fibrillation, CHF, CAD, HTN, HLD, and DMII as evidenced by voiced adherence to plan of care verbalize basic understanding of  Atrial Fibrillation, CHF, CAD, HTN, HLD, and DMII disease process and self health management plan as evidenced by voiced understanding and teach back take all medications exactly as prescribed and will call provider for medication related questions as evidenced by dispense report and pt verbalization attend all scheduled medical appointments: [facility] 10/11/21 as evidenced by medical records demonstrate Improved adherence to prescribed treatment plan for Atrial Fibrillation, CHF, CAD, HTN, HLD, and DMII as evidenced by readings within limits, voiced adherence to plan of care continue to work with RN Care Manager to address care management and care coordination needs related to  Atrial Fibrillation, CHF, CAD, HTN, HLD, and DMII as evidenced by adherence to CM Team Scheduled appointments work with community resource care guide to address needs related to  [employer] knowledge of community resource: home repairs as evidenced by patient and/or community resource care guide support through collaboration with Consulting civil engineer, provider, and care team.   Interventions: 1:1 collaboration with primary care provider regarding development and update of comprehensive plan of care as evidenced by provider attestation and co-signature Inter-disciplinary care team collaboration (see longitudinal plan of care) Evaluation of current treatment plan related to  self management and patient's adherence to plan as established by provider   AFIB  Interventions: (Status:  Goal on track:  Yes.) Long Term Goal   Counseled on increased risk of stroke due to Afib and benefits of anticoagulation for stroke prevention Reviewed importance of adherence to anticoagulant exactly as prescribed Counseled on bleeding risk associated with Eliquis and importance of self-monitoring for signs/symptoms of bleeding Counseled on avoidance of NSAIDs due to increased bleeding risk with anticoagulants Counseled on seeking medical attention after a head injury or if there is blood in the urine/stool   CAD Interventions: (Status:  Goal on track:  Yes.) Long Term Goal Assessed understanding of CAD diagnosis Medications reviewed including medications utilized in CAD treatment plan Provided education on importance of blood pressure control in management of CAD Provided education on  Importance of limiting foods high in cholesterol Reviewed Importance of taking all medications as prescribed Advised to report any changes in symptoms or exercise tolerance Reviewed signs and symptoms to call doctor and when to call 911   Heart Failure Interventions:  (Status:  Goal on track:  NO.) Long Term Goal Basic overview and discussion of pathophysiology of Heart Failure reviewed Provided education on low sodium diet Reviewed Heart Failure Action Plan in depth and provided written copy Provided education about placing scale on hard, flat surface Advised patient to weigh each morning after emptying bladder Discussed importance of daily weight and advised patient to weigh and record daily Reviewed role of diuretics in prevention of fluid overload and management of heart failure; Discussed the importance of keeping all appointments with provider Reinforced to send in Cardiomem daily. Reviewed importance of keeping appointment with HF clinic on 10/11/21.  Reinforced s/sx of HF to call provider. Reviewed importance of getting refills of his torsemide before he runs out   Diabetes  Interventions:  (Status:  Goal on track:  NO.) Long Term Goal Assessed patient's understanding of A1c goal: <7% Provided education to patient about basic DM disease process Reviewed medications with patient and discussed importance of medication adherence Counseled on importance of regular laboratory monitoring as prescribed Provided patient with written educational materials related to hypo and hyperglycemia and importance of correct treatment Advised patient, providing education and rationale, to check cbg daily and record, calling provider for findings outside established parameters Referral made to community resources care guide team for assistance with pt has numbers to call to get window repaired. Reinforced to call numbers to see about getting repairs made Reinforced to try to check his blood sugars and to avoid fast food.  Reviewed to make follow up appointment with provider to check his diabetes Lab Results  Component Value Date   HGBA1C 8.7 (A) 05/05/2021   Hyperlipidemia Interventions:  (Status:  Goal on track:  Yes.) Long Term Goal Medication review performed; medication list updated in electronic medical record.  Provider established cholesterol goals reviewed Counseled on importance of regular laboratory monitoring as prescribed Reviewed role and benefits of statin for ASCVD risk reduction Reviewed importance of limiting foods high in cholesterol  Hypertension Interventions:  (Status:  Goal on track:  Yes.) Long Term Goal Last practice recorded BP readings:  BP Readings from Last 3 Encounters:  08/28/21 110/70  07/19/21 139/62  06/21/21 (!) 150/88  Most recent eGFR/CrCl: No results found for: EGFR  No components found for: CRCL  Evaluation of current treatment plan related to hypertension self management and patient's adherence to plan as established by provider Provided education to patient re: stroke prevention, s/s of heart attack and stroke Advised patient, providing  education and rationale, to monitor blood pressure daily and record, calling PCP for findings outside established parameters Discussed complications of poorly controlled blood pressure such as heart disease, stroke, circulatory complications, vision complications, kidney impairment, sexual dysfunction  Patient Goals/Self-Care Activities: Take all medications as prescribed Attend all scheduled provider appointments Call pharmacy for medication refills 3-7 days in advance of running out of medications Call provider office for new concerns or questions  call office if I gain more than 2 pounds in one day or 5 pounds in one week keep legs up while sitting watch for swelling in feet, ankles and legs every day weigh myself daily track symptoms and what helps feel better or worse Send Cardiomem reading daily schedule appointment with eye doctor check blood  sugar at prescribed times: once daily and when you have symptoms of low or high blood sugar check feet daily for cuts, sores or redness take the blood sugar log to all doctor visits fill half of plate with vegetables limit fast food meals to no more than 1 per week manage portion size switch to sugar-free drinks wash and dry feet carefully every day check pulse (heart) rate once a day make a plan to eat healthy take medicine as prescribed check blood pressure daily choose a place to take my blood pressure (home, clinic or office, retail store) keep a blood pressure log take blood pressure log to all doctor appointments take medications for blood pressure exactly as prescribed eat more whole grains, fruits and vegetables, lean meats and healthy fats limit salt intake to 2073m/day call for medicine refill 2 or 3 days before it runs out take all medications exactly as prescribed call doctor with any symptoms you believe are related to your medicine  Follow Up Plan:  Telephone follow up appointment with care management team member  scheduled for:  10/27/21 The patient has been provided with contact information for the care management team and has been advised to call with any health related questions or concerns.       Plan:Telephone follow up appointment with care management team member scheduled for:  10/27/21 The patient has been provided with contact information for the care management team and has been advised to call with any health related questions or concerns.  MPeter GarterRN, BJackquline Denmark CDE Care Management Coordinator [facility] ([phone]

## 2021-09-29 NOTE — Patient Instructions (Signed)
Visit Information  Thank you for taking time to visit with me today. Please don't hesitate to contact me if I can be of assistance to you before our next scheduled telephone appointment.  Following are the goals we discussed today:  Take all medications as prescribed Attend all scheduled provider appointments Call pharmacy for medication refills 3-7 days in advance of running out of medications Call provider office for new concerns or questions  call office if I gain more than 2 pounds in one day or 5 pounds in one week keep legs up while sitting watch for swelling in feet, ankles and legs every day weigh myself daily track symptoms and what helps feel better or worse Send Cardiomem reading daily schedule appointment with eye doctor check blood sugar at prescribed times: once daily and when you have symptoms of low or high blood sugar check feet daily for cuts, sores or redness take the blood sugar log to all doctor visits fill half of plate with vegetables limit fast food meals to no more than 1 per week manage portion size switch to sugar-free drinks wash and dry feet carefully every day check pulse (heart) rate once a day make a plan to eat healthy take medicine as prescribed check blood pressure daily choose a place to take my blood pressure (home, clinic or office, retail store) keep a blood pressure log take blood pressure log to all doctor appointments take medications for blood pressure exactly as prescribed eat more whole grains, fruits and vegetables, lean meats and healthy fats limit salt intake to 2000mg /day call for medicine refill 2 or 3 days before it runs out take all medications exactly as prescribed call doctor with any symptoms you believe are related to your medicine  Our next appointment is by telephone on 10/27/21 at 10 AM  Please call the care guide team at [phone] if you need to cancel or reschedule your appointment.   If you are experiencing a  Mental Health or [place] or need someone to talk to, please call the Suicide and Crisis Lifeline: 988 call the [place] National Suicide Prevention Lifeline: 445-737-4685 or TTY: 253-230-8068 TTY 404-166-2645) to talk to a trained counselor call 1-800-273-TALK (toll free, 24 hour hotline) go to [facility] [address] [place] [phone]) call 911   Patient verbalizes understanding of instructions and care plan provided today and agrees to view in [place]. Active MyChart status confirmed with patient.    Peter Garter RN, Jackquline Denmark, CDE Care Management Coordinator [facility] [phone]

## 2021-10-05 DIAGNOSIS — Z4802 Encounter for removal of sutures: Secondary | ICD-10-CM | POA: Diagnosis not present

## 2021-10-10 ENCOUNTER — Telehealth (HOSPITAL_COMMUNITY): Payer: Self-pay

## 2021-10-10 DIAGNOSIS — I25110 Atherosclerotic heart disease of native coronary artery with unstable angina pectoris: Secondary | ICD-10-CM

## 2021-10-10 DIAGNOSIS — I4891 Unspecified atrial fibrillation: Secondary | ICD-10-CM | POA: Diagnosis not present

## 2021-10-10 DIAGNOSIS — I10 Essential (primary) hypertension: Secondary | ICD-10-CM | POA: Diagnosis not present

## 2021-10-10 DIAGNOSIS — I5032 Chronic diastolic (congestive) heart failure: Secondary | ICD-10-CM

## 2021-10-10 DIAGNOSIS — E7800 Pure hypercholesterolemia, unspecified: Secondary | ICD-10-CM | POA: Diagnosis not present

## 2021-10-10 DIAGNOSIS — E1165 Type 2 diabetes mellitus with hyperglycemia: Secondary | ICD-10-CM | POA: Diagnosis not present

## 2021-10-10 NOTE — Telephone Encounter (Signed)
Called to confirm/remind patient of their appointment at the [facility] on 10/11/21.   Patient reminded to bring all medications and/or complete list.  Confirmed patient has transportation. Gave directions, instructed to utilize [facility] parking.  Confirmed appointment prior to ending call.

## 2021-10-10 NOTE — Progress Notes (Signed)
Patient ID: [name] [name], male   DOB: 12/08/1955, 66 y.o.   MRN: [id number] PCP: [name], MD Cardiology: Dr. Aundra Dubin  66 y.o. with history of HTN, diabetes, paroxysmal atrial fibrillation, and CAD presents for cardiology followup.  He was admitted in 5/13 with chest pain with exertion.  LHC was done showing global HK with EF 45% and diffuse, severe distal and branch vessel disease.  There was no interventional option, but it was suspect that this disease could be causing his anginal-type pain.  Echo at that time was read as showing EF 60-65%.  He was admitted in 5/14 with hypertensive urgency and chest pain.  He ruled out for MI and BP was controlled.  His EF was 50-55% by echo.  ETT-Sestamibi done as outpatient showed small fixed inferior defect with no ischemia but EF was 31%.  He was quite hypertensive during the study.  Cardiac MRI done to confirm EF in 5/14 showed EF 44%.  He had an episode of atrial fibrillation/RVR in 3/15 but went back into NSR.  Echo in 2/16 showed EF 55-60% with moderate diastolic dysfunction.   He had a Cardiolite in 9/16 showing inferior ischemia.  He was having exertional dyspnea with less activity than normal.  There was concern for worsened CAD and he was sent for [facility] in 9/16. By RHC, filling pressures were not significantly elevated. He had a severe distal RCA stenosis that was treated with DES x 2.  Echo showed EF 55-60%.    In 10/16, he was admitted with acute cholecystitis.  He received a cholecystostomy tube.  Cholecystectomy done in 6/17.   Atrial fibrillation in 1/17, cardioverted to NSR.   Recurrent concerning chest pain in 3/17.  Repeat LHC showed diffuse distal and branch vessel disease consistent with poorly-controlled DM, but patent RCA stents.    He was admitted with atrial fibrillation in 12/17.  Started on sotalol and converted to NSR.    He had recurrent chest pain in 5/18 with abnormal Cardiolite.  Cath was done again in 5/18,  showing diffuse CAD with no good interventional options.   Admitted 11/20 with increased dyspnea and exertional chest pain. Hgb went down to 8.1. GI consulted.  Had [place] that showed severe distal and branch vessel disease but no interventional targets and not good anatomy for CABG. Continued on medical management. Capsule endoscopy without recent or active bleeding, no AVMs. He went into atrial flutter during his hospitalization. He went back into NSR, however, after discharge.  +Snoring and daytime sleepiness.   He has had episodes of syncope associated with vigorous coughing.   He was in the ER with atrial flutter and dyspnea in 4/21. Zio monitor in 9/22 showed NSR with PACs.   Follow up 1/23 not compliant with medications or Cardiomems.  Today he returns for HF follow up. Overall feeling fair, blood sugars have been > 500 recently. He is SOB walking about 100 feet, this is his baseline. Denies abnormal bleeding, palpitations, CP, dizziness, edema, or PND/Orthopnea. Appetite ok. No fever or chills. He does not weigh at home. Taking all medications consistently about 3-4x/week. He now has his CPAP and is using nightly.  ECG (personally reviewed): none ordered today  Cardiomems: Goal 24, last reading 09/29/21 with PAd 29  Labs (5/13): K 3.6, creatinine 0.93, LDL 85, HDL 57 Labs (7/13): K 3.9, creatinine 1.1, LDL 57, HDL 51  Labs (5/14): K 4, creatinine 1.25 Labs (8/14): K 3.7, creatinine 0.9, LDL 103, HDL 42 Labs (  8/15): K 3.5 creatinine 1.0, BNP 75 Labs (2/16): K 3.8, creatinine 1.1 Labs (9/16): K 3.5, creatinine 1.03, BNP 59, HDL 40, LDL 111, TGs 333 Labs (11/16): K 3.6, creaitnine 1.07, LFTs normal, LDL 44 Labs (6/17): K 3.4, creatinine 1.52 => 1.13 Labs (1/18): K 3.9, creatinine 1.25 Labs (6/18): K 3.8, creatinine 1.21, HCT 39.7 Labs (8/18): LDL 39, HDL 43 Labs (12/18): 4.4, creatinine 1.25, LFTs normal Labs (8/19): K 3.6, creatinine 1.13, LDL 68, HDL 49  Labs (11/20): K 3.5,  creatinine 1.33, LDL 25 Labs (4/21): K 4.0, SCr 1.14, BNP 266  Labs (6/21): K 3.7, creatinine 1.25 Labs (12/21): LDL 25 Labs (1/22): BNP 132 Labs (2/22): K 3.4, creatinine 1.44 Labs (8/22): LDL 81 Labs (9/22): K 3.2, creatinine 1.53 Labs (10/22): K 3.7, creatinine 1.39 Labs (11/22): K 4.4, creatinine 1.47  PMH: 1. DM2 2. Gout 3. HTN: Cough with ACEI.  4. Cardiomyopathy: Suspect mixed ischemic/nonischemic (?ETOH-related).  EF 35% in 2008.  Echo in 5/13 showed EF 60-65% with moderate LVH but EF was 45% on LV-gram in 5/13.  Echo (5/14) with EF 50-55%, mild LVH, inferobasal HK, mild MR.  ETT-Sestamibi in 5/14, however, showed EF 31%.  Cardiac MRI (6/14) with EF 44%, mild global hypokinesis, subepicardial delayed enhancement in a nonspecific RV insertion site pattern.  Echo (2/16) with EF 55-60%, moderate diastolic dysfunction. RHC (9/16) with mean RA 8, PA 39/20 mean 28, mean PCWP 14, CI 2.2. Echo (9/16) with EF 55-60%, grade II diastolic dysfunction.  - Echo (6/17) with EF 55-60%, moderate LVH, grade II diastolic dysfunction, mild AI, mild MR.  - RHC (11/20): mean RA 4, PA 46/14 mean 26, mean PCWP 13, CI 3.23 - Echo (9/20): EF 60-65%, moderate LVH, normal RV size and systolic function, mild-moderate MR, mild AI.  - Echo (5/21): EF 55%, RV normal - Has Cardiomems.  5. CAD: LHC in 2010 with mild nonobstructive disease.  LHC (5/13) with diffuse distal and branch vessel disease, mild global hypokinesis and EF 45%.  ETT-Sestamibi (5/14): EF 31%, small fixed inferior defect with no ischemia.  Lexiscan Cardiolite (9/16) with EF 26%, moderate inferior defect that was partially reversible, suggesting ischemia => High risk study. LHC (9/16) with 40-50% mLAD, diffuse up to 50% distal LAD, 90% ostium of branch off ramus, 95% dRCA => DES to RCA x 2, EF 55-60%.   - LHC (3/17) with diffuse branch and distal vessel disease c/w poorly controlled DM, RCA stents patent.  - LHC (5/18): Diffuse CAD with no good  interventional option. 60% mRCA, 90% dLAD, 50% serial mid ramus stenoses, ramus branches with ostial 80-90% stenoses. - LHC (11/20): Severe distal and branch vessel disease but no interventional targets and not good anatomy for CABG  6. H/o chronic HBV 7. Osteochondroma left shoulder.  8. Hyperlipidemia 9. Paroxysmal atrial fibrillation: Coumadin.  Developed cough and increased ESR with amiodarone.  DCCV in 1/17.  Recurrent atrial fibrillation in 12/17, sotalol started.  10. Atrial flutter: had ablations in 2007 and 2008.  - Zio patch in 4/21 with 20% atrial flutter.  11. OSA on CPAP.  12. Acute cholecystitis (10/16): Cholecystostomy tube placed.  Cholecystectomy 6/17.  13. CKD 14. Anemia - Suspected GI bleed but EGD/c-scope 9/20 without significant findings.  - Capsule Endoscopy (11/20) without recent or active bleeding, no AVMs.  15. Syncope: Suspected cough syncope.  16. OSA  SH: Married, prior smoker.  Some ETOH, occasionally heavy in past.  Does use occasional marijuana. Out of work Engineer, drilling. 2 daughters  in grad school.   FH: CAD  ROS: All systems reviewed and negative except as per HPI.   Current Outpatient Medications  Medication Sig Dispense Refill   ACCU-CHEK SOFTCLIX LANCETS lancets Use as instructed for three times daily testing of blood glucose 100 each 12   acetaminophen (TYLENOL) 500 MG tablet Take 1,000 mg by mouth every 6 (six) hours as needed (pain).     albuterol (VENTOLIN HFA) 108 (90 Base) MCG/ACT inhaler Inhale 1-2 puffs into the lungs every 6 (six) hours as needed for wheezing or shortness of breath. 8 g 0   amLODipine (NORVASC) 10 MG tablet Take 1 tablet (10 mg total) by mouth daily. 90 tablet 3   apixaban (ELIQUIS) 5 MG TABS tablet Take 1 tablet (5 mg total) by mouth 2 (two) times daily. 60 tablet 6   atorvastatin (LIPITOR) 80 MG tablet Take 1 tablet (80 mg total) by mouth daily. 90 tablet 3   blood glucose meter kit and supplies KIT Dispense based on patient  and insurance preference. Use up to four times daily as directed. (FOR ICD-9 250.00, 250.01). 1 each 0   Blood Glucose Monitoring Suppl (ACCU-CHEK AVIVA PLUS) w/Device KIT Use as directed for 3 times daily testing of blood glucose 1 kit 0   carvedilol (COREG) 25 MG tablet TAKE 1 TABLET BY MOUTH TWICE DAILY WITH A MEAL 180 tablet 3   colchicine 0.6 MG tablet On day 1 take 2 tablets at first sign of gout flare.  Take 1 tablet 1 hour later.  Then take 1 tablet daily starting the next day until resolution of gout flare. 10 tablet 1   empagliflozin (JARDIANCE) 10 MG TABS tablet Take 1 tablet (10 mg total) by mouth daily before breakfast. 30 tablet 11   eplerenone (INSPRA) 25 MG tablet Take 1 tablet (25 mg total) by mouth daily. 30 tablet 11   glucose blood (ACCU-CHEK AVIVA PLUS) test strip Use as instructed for 3 times daily testing of blood glucose 100 each 12   guaiFENesin (MUCINEX PO) Take 1 tablet by mouth 2 (two) times daily as needed (cough/congestion).      hydrALAZINE (APRESOLINE) 50 MG tablet TAKE 1 & 1/2 (ONE & ONE-HALF) TABLETS BY MOUTH TWICE DAILY. MUST MAKE FOLLOW UP APPT FOR FURTHER REFILLS 90 tablet 5   insulin glargine (LANTUS SOLOSTAR) 100 UNIT/ML Solostar Pen Inject 40 Units into the skin every morning. And pen needles 1/day 45 mL 3   Insulin Pen Needle (PEN NEEDLES) 32G X 4 MM MISC 1 each by Does not apply route daily. 100 each 3   isosorbide mononitrate (IMDUR) 120 MG 24 hr tablet Take 1 tablet (120 mg total) by mouth daily. 30 tablet 6   Lancet Devices (ACCU-CHEK SOFTCLIX) lancets Use as instructed for 3 times daily testing of blood glucose 1 each 0   meclizine (ANTIVERT) 12.5 MG tablet Take 1 tablet (12.5 mg total) by mouth 3 (three) times daily as needed for dizziness. 12 tablet 0   metFORMIN (GLUCOPHAGE) 1000 MG tablet Take 1 tablet (1,000 mg total) by mouth daily with breakfast. TAKE 1 TABLET BY MOUTH TWICE DAILY WITH A MEAL (Patient taking differently: Take 1,000 mg by mouth daily  with breakfast.) 60 tablet 3   sotalol (BETAPACE) 120 MG tablet Take 1 tablet (120 mg total) by mouth 2 (two) times daily. 180 tablet 3   torsemide (DEMADEX) 20 MG tablet Take 4 tablets (80 mg total) by mouth 2 (two) times daily. 240 tablet 11  TRUEPLUS LANCETS 28G MISC 1 each by Does not apply route 3 (three) times daily. 100 each 12   nitroGLYCERIN (NITROSTAT) 0.4 MG SL tablet Place 1 tablet (0.4 mg total) under the tongue every 5 (five) minutes as needed for chest pain (x 3 tabs daily). (Patient not taking: Reported on 10/11/2021) 60 tablet 1   No current facility-administered medications for this encounter.   BP (!) 110/50    Pulse 69    Wt 91.4 kg (201 lb 9.6 oz)    SpO2 98%    BMI 27.34 kg/m    Wt Readings from Last 3 Encounters:  10/11/21 91.4 kg (201 lb 9.6 oz)  08/28/21 93.9 kg (207 lb)  07/19/21 93.9 kg (207 lb 1.6 oz)   PHYSICAL EXAM: General:  NAD. No resp difficulty HEENT: Normal Neck: Supple. No JVD. Carotids 2+ bilat; no bruits. No lymphadenopathy or thryomegaly appreciated. Cor: PMI nondisplaced. Regular rate & rhythm. No rubs, gallops or murmurs. Lungs: Clear Abdomen: Soft, nontender, nondistended. No hepatosplenomegaly. No bruits or masses. Good bowel sounds. Extremities: No cyanosis, clubbing, rash, edema Neuro: Alert & oriented x 3, cranial nerves grossly intact. Moves all 4 extremities w/o difficulty. Affect pleasant.  Assessment/Plan: 1. Chronic diastolic CHF: EF 62% on cardiac MRI in 6/14 but EF improved back to 55-60% on 2/16 echo.  However, EF down to 26% on Cardiolite in 9/16.  Echo in 9/20 showed EF 60-65%, moderate LVH, normal RV, mild-moderate MR.  Echo in 5/21 with EF 55%, normal RV.  He has a Cardiomems device, last transmission 09/29/21 which showed PAD 29 (goal 24). He is not overloaded on exam, stable NYHA class III symptoms. Weight is down 6 lbs.  - Continue eplerenone 25 mg daily (gynecomastia with spiro in past). BMET today. - Continue torsemide 80 mg  bid.  - Continue empagliflozin 10 mg daily.  - Continue hydralazine 75 mg bid (take the pm dose) + Imdur 120 mg daily. - Continue carvedilol 25 mg bid. - I encouraged him to send Cardiomems at least 2-3 times/week.  2. Atrial fibrillation/flutter: Paroxysmal. He has had atrial flutter ablations x 2 remotely.  He is on sotalol.  QTc ok on ECG today.  He tolerates atrial arrhythmias poorly, seems to worsen CHF.  Zio patch in 4/21 with 20% atrial flutter. He saw EP, repeat ablation was not recommended. Repeat Zio in 9/22 showed only NSR and PADs.  Regular on exam today.  - Continue sotalol, QTc acceptable on ECG today.   - Continue Eliquis. 3. Coronary artery disease: LHC in 9/16 showed diffuse CAD, most significant involving the distal RCA.  Patient had DES x 2 to RCA.  Cath in 3/17 with patent RCA stents and diffuse distal and branch vessel disease concerning for poorly controlled diabetes.  Cath in 5/18 again showed diffuse CAD without good interventional target.  Cath in 11/20 showed severe distal and branch vessel disease but no interventional targets and not good anatomy for CABG.  He denies chest pain. - No ASA given Eliquis use.  - Continue atorvastatin 80 mg daily. - Continue Coreg, amlodipine, and Imdur daily for angina control.  - Avoid ranolazine with sotalol use.  4. Hyperlipidemia:  On atorvastatin, good lipids 1/23.   5. Hypertension: BP controlled.  6. OSA: Continue CPAP. 7. CKD stable 3: BMET today.  8. DM: On SGLT2i + insulin.  - Blood sugars uncontrolled lately, advised following up with PCP.  Follow up with APP in 8 weeks (consider paramedicine to help with  compliance) and Dr. Aundra Dubin in 4 months.   Maricela Bo [facility] FNP-BC 10/11/2021

## 2021-10-11 ENCOUNTER — Encounter (HOSPITAL_COMMUNITY): Payer: Self-pay

## 2021-10-11 ENCOUNTER — Ambulatory Visit (HOSPITAL_COMMUNITY)
Admission: RE | Admit: 2021-10-11 | Discharge: 2021-10-11 | Disposition: A | Discharge status: Home / Self Care | Payer: Medicare Other | Source: Ambulatory Visit | Attending: Family Medicine | Admitting: Family Medicine

## 2021-10-11 ENCOUNTER — Other Ambulatory Visit: Payer: Self-pay

## 2021-10-11 VITALS — BP 110/50 | HR 69 | Wt 201.6 lb

## 2021-10-11 DIAGNOSIS — Z7901 Long term (current) use of anticoagulants: Secondary | ICD-10-CM | POA: Diagnosis not present

## 2021-10-11 DIAGNOSIS — N1831 Chronic kidney disease, stage 3a: Secondary | ICD-10-CM | POA: Diagnosis not present

## 2021-10-11 DIAGNOSIS — R0602 Shortness of breath: Secondary | ICD-10-CM | POA: Insufficient documentation

## 2021-10-11 DIAGNOSIS — I5032 Chronic diastolic (congestive) heart failure: Secondary | ICD-10-CM | POA: Diagnosis not present

## 2021-10-11 DIAGNOSIS — I4892 Unspecified atrial flutter: Secondary | ICD-10-CM | POA: Insufficient documentation

## 2021-10-11 DIAGNOSIS — Z9989 Dependence on other enabling machines and devices: Secondary | ICD-10-CM | POA: Diagnosis not present

## 2021-10-11 DIAGNOSIS — Z9114 Patient's other noncompliance with medication regimen: Secondary | ICD-10-CM | POA: Insufficient documentation

## 2021-10-11 DIAGNOSIS — N189 Chronic kidney disease, unspecified: Secondary | ICD-10-CM | POA: Diagnosis not present

## 2021-10-11 DIAGNOSIS — I130 Hypertensive heart and chronic kidney disease with heart failure and stage 1 through stage 4 chronic kidney disease, or unspecified chronic kidney disease: Secondary | ICD-10-CM | POA: Insufficient documentation

## 2021-10-11 DIAGNOSIS — I2510 Atherosclerotic heart disease of native coronary artery without angina pectoris: Secondary | ICD-10-CM

## 2021-10-11 DIAGNOSIS — I10 Essential (primary) hypertension: Secondary | ICD-10-CM | POA: Diagnosis not present

## 2021-10-11 DIAGNOSIS — I480 Paroxysmal atrial fibrillation: Secondary | ICD-10-CM

## 2021-10-11 DIAGNOSIS — Z79899 Other long term (current) drug therapy: Secondary | ICD-10-CM | POA: Insufficient documentation

## 2021-10-11 DIAGNOSIS — Z7984 Long term (current) use of oral hypoglycemic drugs: Secondary | ICD-10-CM | POA: Insufficient documentation

## 2021-10-11 DIAGNOSIS — G4733 Obstructive sleep apnea (adult) (pediatric): Secondary | ICD-10-CM

## 2021-10-11 DIAGNOSIS — E1122 Type 2 diabetes mellitus with diabetic chronic kidney disease: Secondary | ICD-10-CM | POA: Insufficient documentation

## 2021-10-11 DIAGNOSIS — Z8639 Personal history of other endocrine, nutritional and metabolic disease: Secondary | ICD-10-CM | POA: Diagnosis not present

## 2021-10-11 DIAGNOSIS — Z955 Presence of coronary angioplasty implant and graft: Secondary | ICD-10-CM | POA: Insufficient documentation

## 2021-10-11 DIAGNOSIS — E785 Hyperlipidemia, unspecified: Secondary | ICD-10-CM

## 2021-10-11 DIAGNOSIS — I25119 Atherosclerotic heart disease of native coronary artery with unspecified angina pectoris: Secondary | ICD-10-CM | POA: Diagnosis not present

## 2021-10-11 LAB — BASIC METABOLIC PANEL
Anion gap: 12 (ref 5–15)
BUN: 29 mg/dL — ABNORMAL HIGH (ref 8–23)
CO2: 27 mmol/L (ref 22–32)
Calcium: 9.6 mg/dL (ref 8.9–10.3)
Chloride: 93 mmol/L — ABNORMAL LOW (ref 98–111)
Creatinine, Ser: 2.06 mg/dL — ABNORMAL HIGH (ref 0.61–1.24)
GFR, Estimated: 35 mL/min — ABNORMAL LOW (ref >60)
Glucose, Bld: 357 mg/dL — ABNORMAL HIGH (ref 70–99)
Potassium: 4.3 mmol/L (ref 3.5–5.1)
Sodium: 132 mmol/L — ABNORMAL LOW (ref 135–145)

## 2021-10-11 NOTE — Patient Instructions (Signed)
It was great to see you today! ?No medication changes are needed at this time. ? ?Labs today ?We will only contact you if something comes back abnormal or we need to make some changes. ?Otherwise no news is good news! ? ?Your provider recommends that you make an appointment with Dr Grier Mitts regarding your diabetes (blood sugar) ? ?Your physician recommends that you schedule a follow-up appointment in: 2 months  in the Advanced Practitioners (PA/NP) Clinic and in 4 months with Dr Aundra Dubin ? ? ?Do the following things EVERYDAY: ?Weigh yourself in the morning before breakfast. Write it down and keep it in a log. ?Take your medicines as prescribed ?Eat low salt foods--Limit salt (sodium) to 2000 mg per day.  ?Stay as active as you can everyday ?Limit all fluids for the day to less than 2 liters ? ? ?At the [facility], you and your health needs are our priority. As part of our continuing mission to provide you with exceptional heart care, we have created designated Provider Care Teams. These Care Teams include your primary Cardiologist (physician) and Advanced Practice Providers (APPs- Physician Assistants and Nurse Practitioners) who all work together to provide you with the care you need, when you need it.  ? ?You may see any of the following providers on your designated Care Team at your next follow up: ?Dr Glori Bickers ?Dr Loralie Champagne ?Darrick Grinder, NP ?Lyda Jester, PA ?Jessica Milford,NP ?Marlyce Huge, PA ?Audry Riles, PharmD ? ? ?Please be sure to bring in all your medications bottles to every appointment.  ? ? ?

## 2021-10-12 ENCOUNTER — Telehealth (HOSPITAL_COMMUNITY): Payer: Self-pay | Admitting: Surgery

## 2021-10-12 DIAGNOSIS — I5032 Chronic diastolic (congestive) heart failure: Secondary | ICD-10-CM

## 2021-10-12 DIAGNOSIS — I5033 Acute on chronic diastolic (congestive) heart failure: Secondary | ICD-10-CM

## 2021-10-12 MED ORDER — TORSEMIDE 20 MG PO TABS: ORAL_TABLET | ORAL | 11 refills | Status: DC

## 2021-10-12 NOTE — Telephone Encounter (Signed)
-----   Message from Rafael Bihari, [place] sent at 10/11/2021  5:16 PM EST ----- ?Kidney function elevated. Decrease torsemide to 80 mg qam/40 mg qpm ? ?Repeat BMET in 10-14 days. ?

## 2021-10-12 NOTE — Telephone Encounter (Signed)
Patient called and given results and recommendations per provider.  I have updated CHL and scheduled appt on Monday Mar 13th. ?

## 2021-10-23 ENCOUNTER — Ambulatory Visit (HOSPITAL_COMMUNITY)
Admission: RE | Admit: 2021-10-23 | Discharge: 2021-10-23 | Disposition: A | Discharge status: Home / Self Care | Payer: Medicare Other | Source: Ambulatory Visit | Attending: Internal Medicine | Admitting: Internal Medicine

## 2021-10-23 ENCOUNTER — Telehealth (HOSPITAL_COMMUNITY): Payer: Self-pay | Admitting: Surgery

## 2021-10-23 ENCOUNTER — Other Ambulatory Visit: Payer: Self-pay

## 2021-10-23 DIAGNOSIS — I5033 Acute on chronic diastolic (congestive) heart failure: Secondary | ICD-10-CM | POA: Diagnosis not present

## 2021-10-23 LAB — BASIC METABOLIC PANEL
Anion gap: 9 (ref 5–15)
BUN: 23 mg/dL (ref 8–23)
CO2: 26 mmol/L (ref 22–32)
Calcium: 9.3 mg/dL (ref 8.9–10.3)
Chloride: 95 mmol/L — ABNORMAL LOW (ref 98–111)
Creatinine, Ser: 1.44 mg/dL — ABNORMAL HIGH (ref 0.61–1.24)
GFR, Estimated: 53 mL/min — ABNORMAL LOW (ref >60)
Glucose, Bld: 490 mg/dL — ABNORMAL HIGH (ref 70–99)
Potassium: 4.4 mmol/L (ref 3.5–5.1)
Sodium: 130 mmol/L — ABNORMAL LOW (ref 135–145)

## 2021-10-23 NOTE — Telephone Encounter (Signed)
Micki Riley, RN  ?10/23/2021 11:16 AM EDT Back to Top  ?  ?Patient called to review results and recommendations.  He tells me that he has not yet taken insulin this AM and will do that in a few minutes since he is "back home now".  He will recheck his blood sugar as well.  ? Rafael Bihari, FNP  ?10/23/2021 10:24 AM EDT   ?  ?Kidney function improved. Blood glucose very high. Needs to recheck now to ensure it is coming down.  ? ?

## 2021-10-25 DIAGNOSIS — G4733 Obstructive sleep apnea (adult) (pediatric): Secondary | ICD-10-CM | POA: Diagnosis not present

## 2021-10-27 ENCOUNTER — Telehealth: Payer: Medicare Other

## 2021-10-27 ENCOUNTER — Telehealth: Payer: Self-pay

## 2021-10-27 NOTE — Telephone Encounter (Signed)
?  Care Management  ? ?Follow Up Note ? ? ?10/27/2021 ?Name: [name] [name] MRN: [id number] DOB: 1955/08/28 ? ? ?Referred by: [name], MD ?Reason for referral : No chief complaint on file. ? ? ?An unsuccessful telephone outreach was attempted today. The patient was referred to the case management team for assistance with care management and care coordination.  ? ?Follow Up Plan: The care management team will reach out to the patient again over the next 30 days.  ? ?Peter Garter RN, BSN,CCM, CDE ?Care Management Coordinator ?[facility] ?[phone]  ? ?

## 2021-10-31 NOTE — Telephone Encounter (Signed)
Rescheduled for 11/06/21 ? ?Laverda Sorenson  ?Care Guide, Embedded Care Coordination ?[place]  Care Management  ?Direct Dial: [phone] ? ?

## 2021-11-06 ENCOUNTER — Ambulatory Visit (INDEPENDENT_AMBULATORY_CARE_PROVIDER_SITE_OTHER): Payer: Medicare Other

## 2021-11-06 DIAGNOSIS — I10 Essential (primary) hypertension: Secondary | ICD-10-CM

## 2021-11-06 DIAGNOSIS — I4891 Unspecified atrial fibrillation: Secondary | ICD-10-CM

## 2021-11-06 DIAGNOSIS — I5032 Chronic diastolic (congestive) heart failure: Secondary | ICD-10-CM

## 2021-11-06 DIAGNOSIS — E7800 Pure hypercholesterolemia, unspecified: Secondary | ICD-10-CM

## 2021-11-06 DIAGNOSIS — E1165 Type 2 diabetes mellitus with hyperglycemia: Secondary | ICD-10-CM

## 2021-11-06 NOTE — Patient Instructions (Signed)
Visit Information ? ?Thank you for taking time to visit with me today. Please don't hesitate to contact me if I can be of assistance to you before our next scheduled telephone appointment. ? ?Following are the goals we discussed today:  ?Take all medications as prescribed ?Attend all scheduled provider appointments ?Call pharmacy for medication refills 3-7 days in advance of running out of medications ?Call provider office for new concerns or questions  ?call office if I gain more than 2 pounds in one day or 5 pounds in one week ?keep legs up while sitting ?watch for swelling in feet, ankles and legs every day ?weigh myself daily ?track symptoms and what helps feel better or worse ?Send Cardiomem reading daily ?schedule appointment with eye doctor ?check blood sugar at prescribed times: once daily and when you have symptoms of low or high blood sugar ?check feet daily for cuts, sores or redness ?take the blood sugar log to all doctor visits ?fill half of plate with vegetables ?limit fast food meals to no more than 1 per week ?manage portion size ?switch to sugar-free drinks ?wash and dry feet carefully every day ?check pulse (heart) rate once a day ?make a plan to eat healthy ?take medicine as prescribed ?check blood pressure daily ?choose a place to take my blood pressure (home, clinic or office, retail store) ?keep a blood pressure log ?take blood pressure log to all doctor appointments ?take medications for blood pressure exactly as prescribed ?eat more whole grains, fruits and vegetables, lean meats and healthy fats ?limit salt intake to '2000mg'$ /day ?call for medicine refill 2 or 3 days before it runs out ?take all medications exactly as prescribed ?call doctor with any symptoms you believe are related to your medicine ? ?Our next appointment is by telephone on 01/05/22 at 10:45 AM ? ?Please call the care guide team at [phone] if you need to cancel or reschedule your appointment.  ? ?If you are experiencing a  Mental Health or [place] or need someone to talk to, please call the Suicide and Crisis Lifeline: 988 ?call the [place] National Suicide Prevention Lifeline: 260-246-1956 or TTY: 6474116359 TTY (432) 391-6094) to talk to a trained counselor ?call 1-800-273-TALK (toll free, 24 hour hotline) ?go to [facility] [address], [place] [phone]) ?call 911  ? ?Patient verbalizes understanding of instructions and care plan provided today and agrees to view in [place]. Active MyChart status confirmed with patient.   ?Peter Garter RN, BSN,CCM, CDE ?Care Management Coordinator ?[facility] ?[phone]   ?

## 2021-11-06 NOTE — Chronic Care Management (AMB) (Signed)
?Chronic Care Management  ? ?CCM RN Visit Note ? ?11/06/2021 ?Name: [name] [name] MRN: [id number] DOB: Feb 26, 1956 ? ?Subjective: ?[name] [name] is a 67 y.o. year old male who is a primary care patient of [name], MD. The care management team was consulted for assistance with disease management and care coordination needs.   ? ?Engaged with patient by telephone for follow up visit in response to provider referral for case management and/or care coordination services.  ? ?Consent to Services:  ?The patient was given information about Chronic Care Management services, agreed to services, and gave verbal consent prior to initiation of services.  Please see initial visit note for detailed documentation.  ? ?Patient agreed to services and verbal consent obtained.  ? ?Assessment: Review of patient past medical history, allergies, medications, health status, including review of consultants reports, laboratory and other test data, was performed as part of comprehensive evaluation and provision of chronic care management services.  ? ?SDOH (Social Determinants of Health) assessments and interventions performed:   ? ?CCM Care Plan ? ?Allergies  ?Allergen Reactions  ? Shrimp [Shellfish Allergy] Nausea And Vomiting  ? Ace Inhibitors Cough  ? Other Hives and Other (See Comments)  ?  Patient reports developing hives after receiving "some antibiotic given in 1980''s at [facility] He does not know which antibiotic.  ? ? ?Outpatient Encounter Medications as of 11/06/2021  ?Medication Sig  ? ACCU-CHEK SOFTCLIX LANCETS lancets Use as instructed for three times daily testing of blood glucose  ? acetaminophen (TYLENOL) 500 MG tablet Take 1,000 mg by mouth every 6 (six) hours as needed (pain).  ? albuterol (VENTOLIN HFA) 108 (90 Base) MCG/ACT inhaler Inhale 1-2 puffs into the lungs every 6 (six) hours as needed for wheezing or shortness of breath.  ? amLODipine (NORVASC) 10 MG tablet Take 1 tablet (10  mg total) by mouth daily.  ? apixaban (ELIQUIS) 5 MG TABS tablet Take 1 tablet (5 mg total) by mouth 2 (two) times daily.  ? atorvastatin (LIPITOR) 80 MG tablet Take 1 tablet (80 mg total) by mouth daily.  ? blood glucose meter kit and supplies KIT Dispense based on patient and insurance preference. Use up to four times daily as directed. (FOR ICD-9 250.00, 250.01).  ? Blood Glucose Monitoring Suppl (ACCU-CHEK AVIVA PLUS) w/Device KIT Use as directed for 3 times daily testing of blood glucose  ? carvedilol (COREG) 25 MG tablet TAKE 1 TABLET BY MOUTH TWICE DAILY WITH A MEAL  ? colchicine 0.6 MG tablet On day 1 take 2 tablets at first sign of gout flare.  Take 1 tablet 1 hour later.  Then take 1 tablet daily starting the next day until resolution of gout flare.  ? empagliflozin (JARDIANCE) 10 MG TABS tablet Take 1 tablet (10 mg total) by mouth daily before breakfast.  ? eplerenone (INSPRA) 25 MG tablet Take 1 tablet (25 mg total) by mouth daily.  ? glucose blood (ACCU-CHEK AVIVA PLUS) test strip Use as instructed for 3 times daily testing of blood glucose  ? guaiFENesin (MUCINEX PO) Take 1 tablet by mouth 2 (two) times daily as needed (cough/congestion).   ? hydrALAZINE (APRESOLINE) 50 MG tablet TAKE 1 & 1/2 (ONE & ONE-HALF) TABLETS BY MOUTH TWICE DAILY. MUST MAKE FOLLOW UP APPT FOR FURTHER REFILLS  ? insulin glargine (LANTUS SOLOSTAR) 100 UNIT/ML Solostar Pen Inject 40 Units into the skin every morning. And pen needles 1/day  ? Insulin Pen Needle (PEN NEEDLES) 32G X 4  MM MISC 1 each by Does not apply route daily.  ? isosorbide mononitrate (IMDUR) 120 MG 24 hr tablet Take 1 tablet (120 mg total) by mouth daily.  ? Lancet Devices (ACCU-CHEK SOFTCLIX) lancets Use as instructed for 3 times daily testing of blood glucose  ? meclizine (ANTIVERT) 12.5 MG tablet Take 1 tablet (12.5 mg total) by mouth 3 (three) times daily as needed for dizziness.  ? metFORMIN (GLUCOPHAGE) 1000 MG tablet Take 1 tablet (1,000 mg total) by  mouth daily with breakfast. TAKE 1 TABLET BY MOUTH TWICE DAILY WITH A MEAL (Patient taking differently: Take 1,000 mg by mouth daily with breakfast.)  ? nitroGLYCERIN (NITROSTAT) 0.4 MG SL tablet Place 1 tablet (0.4 mg total) under the tongue every 5 (five) minutes as needed for chest pain (x 3 tabs daily). (Patient not taking: Reported on 10/11/2021)  ? sotalol (BETAPACE) 120 MG tablet Take 1 tablet (120 mg total) by mouth 2 (two) times daily.  ? torsemide (DEMADEX) 20 MG tablet Please take 80 mg in AM and 40 mg in PM  ? TRUEPLUS LANCETS 28G MISC 1 each by Does not apply route 3 (three) times daily.  ? ?No facility-administered encounter medications on file as of 11/06/2021.  ? ? ?Patient Active Problem List  ? Diagnosis Date Noted  ? Syncope 02/09/2020  ? Generalized weakness 06/27/2019  ? Acute on chronic congestive heart failure with left ventricular diastolic dysfunction ([place]) 06/26/2019  ? Anemia due to chronic kidney disease 06/26/2019  ? CKD (chronic kidney disease), stage III ([place]) 06/26/2019  ? Type 2 diabetes mellitus without complication ([place]) 54/65/0354  ? Iron deficiency anemia due to chronic blood loss 06/26/2019  ? History of atrial fibrillation   ? History of type 2 diabetes mellitus   ? Anemia   ? Chest pain 04/24/2019  ? Overweight (BMI 25.0-29.9) 02/28/2017  ? Unstable angina ([place]) 08/03/2016  ? S/P laparoscopic cholecystectomy 02/07/2016  ? Coronary artery disease involving native coronary artery with unstable angina pectoris ([place]) 12/29/2015  ? Diabetes ([place]) 11/12/2015  ? Chronic anticoagulation 08/22/2015  ? Atrial flutter with controlled response ([place]) 08/22/2015  ? RUQ pain   ? Cholelithiases- drain in place 06/04/2015  ? Essential hypertension   ? Chronic diastolic CHF (congestive heart failure) ([place]) 04/24/2015  ? Excessive daytime sleepiness 11/25/2014  ? Obstructive sleep apnea 11/25/2014  ? NICM (nonischemic cardiomyopathy) ([place]) 03/11/2014  ? Anticoagulation goal of INR 2 to 3 10/19/2013   ? Eczema 10/19/2013  ? Low libido 10/16/2013  ? Persistent atrial fibrillation ([place]) 10/06/2013  ? Rectal bleeding 12/18/2011  ? HYPERCHOLESTEROLEMIA 07/11/2010  ? DENTAL CARIES 06/30/2010  ? GOUT 05/05/2009  ? HEPATITIS B, CHRONIC 12/30/2006  ? OSTEOCHONDROMA 12/30/2006  ? Mitral valve disorder 12/30/2006  ? LIVER FUNCTION TESTS, ABNORMAL, HX OF 12/30/2006  ? COLONIC POLYPS, HX OF 12/30/2006  ? COLONOSCOPY, HX OF 05/12/2001  ? ? ?Conditions to be addressed/monitored:Atrial Fibrillation, CHF, CAD, HTN, HLD, and DMII ? ?Care Plan : RN Care Manager Plan of Care  ?Updates made by Dimitri Ped, RN since 11/06/2021 12:00 AM  ?  ? ?Problem: Chronic Disease Management and Care Coordination Needs (CHF,CAD,Atrial Fib, DMHTN and HLD)   ?Priority: High  ?  ? ?Long-Range Goal: Establish Plan of Care for Chronic Disease Management Needs (CHF,CAD,Atrial Fib, DM,HTN and HLD)   ?Start Date: 08/25/2021  ?Expected End Date: 08/22/2022  ?Priority: High  ?Note:   ?Current Barriers:  ?Knowledge Deficits related to plan of care for management of  Atrial Fibrillation, CHF, CAD, HTN, HLD, and DMII  ?Care Coordination needs related to Lacks knowledge of community resource: home repair ?Chronic Disease Management support and education needs related to Atrial Fibrillation, CHF, CAD, HTN, HLD, and DMII  ?States he has not been weighing daily and not sending his CardioMem to the HF clinic.  States he just does not  remember or think about doing it. States he does have a little shortness of breath with exertion.  Denies any swelling or chest pains.  States he still sometimes forgets to take his PM medications usually about 2-3 times a week.  States he does not use a pill box but he does have a pill box. Pt continues to declines pharmacy referral. States he continues eat fast food 2-3 times a week such as [employer] or [employer] when he is keeping his grandson.   States he is checking his B/P most days with good reading.  States he has not been  checking his CBG regularly. States they can run from 190-400 depending on time of day.  States his higher readings are when he does not take his insulin.  States his insulin shots sometimes hurt.  States he do

## 2021-11-10 DIAGNOSIS — I509 Heart failure, unspecified: Secondary | ICD-10-CM

## 2021-11-10 DIAGNOSIS — I4891 Unspecified atrial fibrillation: Secondary | ICD-10-CM

## 2021-11-10 DIAGNOSIS — E1159 Type 2 diabetes mellitus with other circulatory complications: Secondary | ICD-10-CM

## 2021-11-10 DIAGNOSIS — E785 Hyperlipidemia, unspecified: Secondary | ICD-10-CM | POA: Diagnosis not present

## 2021-11-10 DIAGNOSIS — Z794 Long term (current) use of insulin: Secondary | ICD-10-CM | POA: Diagnosis not present

## 2021-11-10 DIAGNOSIS — I110 Hypertensive heart disease with heart failure: Secondary | ICD-10-CM

## 2021-11-10 DIAGNOSIS — Z7984 Long term (current) use of oral hypoglycemic drugs: Secondary | ICD-10-CM | POA: Diagnosis not present

## 2021-11-10 DIAGNOSIS — I2510 Atherosclerotic heart disease of native coronary artery without angina pectoris: Secondary | ICD-10-CM

## 2021-11-25 DIAGNOSIS — G4733 Obstructive sleep apnea (adult) (pediatric): Secondary | ICD-10-CM | POA: Diagnosis not present

## 2021-12-08 ENCOUNTER — Telehealth (HOSPITAL_COMMUNITY): Payer: Self-pay

## 2021-12-08 NOTE — Progress Notes (Signed)
?Patient ID: [name] [name], male   DOB: 05/15/1956, 66 y.o.   MRN: [id number] ?PCP: [name], MD ?HF Cardiology: Dr. Aundra Dubin ? ?66 y.o. with history of HTN, diabetes, paroxysmal atrial fibrillation, and CAD presents for cardiology followup.  He was admitted in 5/13 with chest pain with exertion.  LHC was done showing global HK with EF 45% and diffuse, severe distal and branch vessel disease.  There was no interventional option, but it was suspect that this disease could be causing his anginal-type pain.  Echo at that time was read as showing EF 60-65%.  He was admitted in 5/14 with hypertensive urgency and chest pain.  He ruled out for MI and BP was controlled.  His EF was 50-55% by echo.  ETT-Sestamibi done as outpatient showed small fixed inferior defect with no ischemia but EF was 31%.  He was quite hypertensive during the study.  Cardiac MRI done to confirm EF in 5/14 showed EF 44%.  He had an episode of atrial fibrillation/RVR in 3/15 but went back into NSR.  Echo in 2/16 showed EF 55-60% with moderate diastolic dysfunction.  ? ?He had a Cardiolite in 9/16 showing inferior ischemia.  He was having exertional dyspnea with less activity than normal.  There was concern for worsened CAD and he was sent for [facility] in 9/16. By RHC, filling pressures were not significantly elevated. He had a severe distal RCA stenosis that was treated with DES x 2.  Echo showed EF 55-60%.   ? ?In 10/16, he was admitted with acute cholecystitis.  He received a cholecystostomy tube.  Cholecystectomy done in 6/17.  ? ?Atrial fibrillation in 1/17, cardioverted to NSR.  ? ?Recurrent concerning chest pain in 3/17.  Repeat LHC showed diffuse distal and branch vessel disease consistent with poorly-controlled DM, but patent RCA stents.   ? ?He was admitted with atrial fibrillation in 12/17. Started on sotalol and converted to NSR.   ? ?He had recurrent chest pain in 5/18 with abnormal Cardiolite.  Cath was done again in 5/18,  showing diffuse CAD with no good interventional options.  ? ?Admitted 11/20 with increased dyspnea and exertional chest pain. Hgb went down to 8.1. GI consulted.  Had [place] that showed severe distal and branch vessel disease but no interventional targets and not good anatomy for CABG. Continued on medical management. Capsule endoscopy without recent or active bleeding, no AVMs. He went into atrial flutter during his hospitalization. He went back into NSR, however, after discharge.  +Snoring and daytime sleepiness.  ? ?He has had episodes of syncope associated with vigorous coughing.  ? ?He was in the ER with atrial flutter and dyspnea in 4/21. Zio monitor in 9/22 showed NSR with PACs.  ? ?Follow up 1/23 not compliant with medications or Cardiomems.  ? ?Today he returns for HF follow up. Overall feeling ok. Abdomen swelling more, occasional dizziness and atypical CP. He remains SOB walking 100 feet on flat ground, this is his baseline. Denies palpitations or PND/Orthopnea. Appetite ok, drinks a lot of fluid. No fever or chills. He does not weigh at home. Taking all medications consistenly ~ 5x/week. Wearing CPAP nightly. Has not used CardioMems in several months. ? ?REDs: 44% ? ?ECG (personally reviewed): SR w/ arrhythmia + LVH, PR 246 msec  ? ?Cardiomems: No readings in > 70 days. ? ?Labs (5/13): K 3.6, creatinine 0.93, LDL 85, HDL 57 ?Labs (7/13): K 3.9, creatinine 1.1, LDL 57, HDL 51  ?Labs (5/14): K 4, creatinine  1.25 ?Labs (8/14): K 3.7, creatinine 0.9, LDL 103, HDL 42 ?Labs (8/15): K 3.5 creatinine 1.0, BNP 75 ?Labs (2/16): K 3.8, creatinine 1.1 ?Labs (9/16): K 3.5, creatinine 1.03, BNP 59, HDL 40, LDL 111, TGs 333 ?Labs (11/16): K 3.6, creaitnine 1.07, LFTs normal, LDL 44 ?Labs (6/17): K 3.4, creatinine 1.52 => 1.13 ?Labs (1/18): K 3.9, creatinine 1.25 ?Labs (6/18): K 3.8, creatinine 1.21, HCT 39.7 ?Labs (8/18): LDL 39, HDL 43 ?Labs (12/18): 4.4, creatinine 1.25, LFTs normal ?Labs (8/19): K 3.6, creatinine 1.13,  LDL 68, HDL 49  ?Labs (11/20): K 3.5, creatinine 1.33, LDL 25 ?Labs (4/21): K 4.0, SCr 1.14, BNP 266  ?Labs (6/21): K 3.7, creatinine 1.25 ?Labs (12/21): LDL 25 ?Labs (1/22): BNP 132 ?Labs (2/22): K 3.4, creatinine 1.44 ?Labs (8/22): LDL 81 ?Labs (9/22): K 3.2, creatinine 1.53 ?Labs (10/22): K 3.7, creatinine 1.39 ?Labs (11/22): K 4.4, creatinine 1.47 ?Labs (3/23): K 4.4, creatinine 1.44 ? ?PMH: ?1. DM2 ?2. Gout ?3. HTN: Cough with ACEI.  ?4. Cardiomyopathy: Suspect mixed ischemic/nonischemic (?ETOH-related).  EF 35% in 2008.  Echo in 5/13 showed EF 60-65% with moderate LVH but EF was 45% on LV-gram in 5/13.  Echo (5/14) with EF 50-55%, mild LVH, inferobasal HK, mild MR.  ETT-Sestamibi in 5/14, however, showed EF 31%.  Cardiac MRI (6/14) with EF 44%, mild global hypokinesis, subepicardial delayed enhancement in a nonspecific RV insertion site pattern.  Echo (2/16) with EF 55-60%, moderate diastolic dysfunction. RHC (9/16) with mean RA 8, PA 39/20 mean 28, mean PCWP 14, CI 2.2. Echo (9/16) with EF 55-60%, grade II diastolic dysfunction.  ?- Echo (6/17) with EF 55-60%, moderate LVH, grade II diastolic dysfunction, mild AI, mild MR.  ?- RHC (11/20): mean RA 4, PA 46/14 mean 26, mean PCWP 13, CI 3.23 ?- Echo (9/20): EF 60-65%, moderate LVH, normal RV size and systolic function, mild-moderate MR, mild AI.  ?- Echo (5/21): EF 55%, RV normal ?- Has Cardiomems.  ?5. CAD: LHC in 2010 with mild nonobstructive disease.  LHC (5/13) with diffuse distal and branch vessel disease, mild global hypokinesis and EF 45%.  ETT-Sestamibi (5/14): EF 31%, small fixed inferior defect with no ischemia.  Lexiscan Cardiolite (9/16) with EF 26%, moderate inferior defect that was partially reversible, suggesting ischemia => High risk study. LHC (9/16) with 40-50% mLAD, diffuse up to 50% distal LAD, 90% ostium of branch off ramus, 95% dRCA => DES to RCA x 2, EF 55-60%.   ?- LHC (3/17) with diffuse branch and distal vessel disease c/w poorly  controlled DM, RCA stents patent.  ?- LHC (5/18): Diffuse CAD with no good interventional option. 60% mRCA, 90% dLAD, 50% serial mid ramus stenoses, ramus branches with ostial 80-90% stenoses. ?- LHC (11/20): Severe distal and branch vessel disease but no interventional targets and not good anatomy for CABG  ?6. H/o chronic HBV ?7. Osteochondroma left shoulder.  ?8. Hyperlipidemia ?9. Paroxysmal atrial fibrillation: Coumadin.  Developed cough and increased ESR with amiodarone.  DCCV in 1/17.  Recurrent atrial fibrillation in 12/17, sotalol started.  ?10. Atrial flutter: had ablations in 2007 and 2008.  ?- Zio patch in 4/21 with 20% atrial flutter.  ?11. OSA on CPAP.  ?12. Acute cholecystitis (10/16): Cholecystostomy tube placed.  Cholecystectomy 6/17.  ?13. CKD ?14. Anemia ?- Suspected GI bleed but EGD/c-scope 9/20 without significant findings.  ?- Capsule Endoscopy (11/20) without recent or active bleeding, no AVMs.  ?15. Syncope: Suspected cough syncope.  ?16. OSA ? ?SH: Married, prior smoker.  Some ETOH, occasionally heavy in past.  Does use occasional marijuana. Out of work Engineer, drilling. 2 daughters in grad school.  ? ?FH: CAD ? ?ROS: All systems reviewed and negative except as per HPI.  ? ?Current Outpatient Medications  ?Medication Sig Dispense Refill  ? ACCU-CHEK SOFTCLIX LANCETS lancets Use as instructed for three times daily testing of blood glucose 100 each 12  ? acetaminophen (TYLENOL) 500 MG tablet Take 1,000 mg by mouth every 6 (six) hours as needed (pain).    ? albuterol (VENTOLIN HFA) 108 (90 Base) MCG/ACT inhaler Inhale 1-2 puffs into the lungs every 6 (six) hours as needed for wheezing or shortness of breath. 8 g 0  ? amLODipine (NORVASC) 10 MG tablet Take 1 tablet (10 mg total) by mouth daily. 90 tablet 3  ? apixaban (ELIQUIS) 5 MG TABS tablet Take 1 tablet (5 mg total) by mouth 2 (two) times daily. 60 tablet 6  ? atorvastatin (LIPITOR) 80 MG tablet Take 1 tablet (80 mg total) by mouth daily. 90  tablet 3  ? blood glucose meter kit and supplies KIT Dispense based on patient and insurance preference. Use up to four times daily as directed. (FOR ICD-9 250.00, 250.01). 1 each 0  ? Blood Glucose Monitoring

## 2021-12-08 NOTE — Telephone Encounter (Signed)
Called to confirm/remind patient of their appointment at the [facility] on 12/11/21.  ? ?Patient reminded to bring all medications and/or complete list. ? ?Confirmed patient has transportation. Gave directions, instructed to utilize [place] parking. ? ?Confirmed appointment prior to ending call.  ? ?

## 2021-12-11 ENCOUNTER — Encounter (HOSPITAL_COMMUNITY): Payer: Self-pay

## 2021-12-11 ENCOUNTER — Ambulatory Visit (HOSPITAL_COMMUNITY)
Admission: RE | Admit: 2021-12-11 | Discharge: 2021-12-11 | Disposition: A | Discharge status: Home / Self Care | Payer: Medicare Other | Source: Ambulatory Visit | Attending: Family Medicine | Admitting: Family Medicine

## 2021-12-11 VITALS — BP 144/78 | HR 76 | Wt 210.6 lb

## 2021-12-11 DIAGNOSIS — Z79899 Other long term (current) drug therapy: Secondary | ICD-10-CM | POA: Insufficient documentation

## 2021-12-11 DIAGNOSIS — N183 Chronic kidney disease, stage 3 unspecified: Secondary | ICD-10-CM | POA: Diagnosis not present

## 2021-12-11 DIAGNOSIS — I130 Hypertensive heart and chronic kidney disease with heart failure and stage 1 through stage 4 chronic kidney disease, or unspecified chronic kidney disease: Secondary | ICD-10-CM | POA: Diagnosis not present

## 2021-12-11 DIAGNOSIS — I2510 Atherosclerotic heart disease of native coronary artery without angina pectoris: Secondary | ICD-10-CM

## 2021-12-11 DIAGNOSIS — Z794 Long term (current) use of insulin: Secondary | ICD-10-CM | POA: Insufficient documentation

## 2021-12-11 DIAGNOSIS — E785 Hyperlipidemia, unspecified: Secondary | ICD-10-CM | POA: Diagnosis not present

## 2021-12-11 DIAGNOSIS — I480 Paroxysmal atrial fibrillation: Secondary | ICD-10-CM | POA: Insufficient documentation

## 2021-12-11 DIAGNOSIS — E1122 Type 2 diabetes mellitus with diabetic chronic kidney disease: Secondary | ICD-10-CM | POA: Diagnosis not present

## 2021-12-11 DIAGNOSIS — I25119 Atherosclerotic heart disease of native coronary artery with unspecified angina pectoris: Secondary | ICD-10-CM | POA: Insufficient documentation

## 2021-12-11 DIAGNOSIS — G4733 Obstructive sleep apnea (adult) (pediatric): Secondary | ICD-10-CM | POA: Diagnosis not present

## 2021-12-11 DIAGNOSIS — I10 Essential (primary) hypertension: Secondary | ICD-10-CM

## 2021-12-11 DIAGNOSIS — I5032 Chronic diastolic (congestive) heart failure: Secondary | ICD-10-CM | POA: Insufficient documentation

## 2021-12-11 DIAGNOSIS — Z7901 Long term (current) use of anticoagulants: Secondary | ICD-10-CM | POA: Diagnosis not present

## 2021-12-11 DIAGNOSIS — Z7984 Long term (current) use of oral hypoglycemic drugs: Secondary | ICD-10-CM | POA: Diagnosis not present

## 2021-12-11 DIAGNOSIS — I4892 Unspecified atrial flutter: Secondary | ICD-10-CM | POA: Diagnosis not present

## 2021-12-11 DIAGNOSIS — Z8639 Personal history of other endocrine, nutritional and metabolic disease: Secondary | ICD-10-CM

## 2021-12-11 DIAGNOSIS — N1831 Chronic kidney disease, stage 3a: Secondary | ICD-10-CM

## 2021-12-11 LAB — BASIC METABOLIC PANEL
Anion gap: 9 (ref 5–15)
BUN: 17 mg/dL (ref 8–23)
CO2: 22 mmol/L (ref 22–32)
Calcium: 9.4 mg/dL (ref 8.9–10.3)
Chloride: 103 mmol/L (ref 98–111)
Creatinine, Ser: 1.24 mg/dL (ref 0.61–1.24)
GFR, Estimated: >60 mL/min (ref >60)
Glucose, Bld: 448 mg/dL — ABNORMAL HIGH (ref 70–99)
Potassium: 4.3 mmol/L (ref 3.5–5.1)
Sodium: 134 mmol/L — ABNORMAL LOW (ref 135–145)

## 2021-12-11 LAB — BRAIN NATRIURETIC PEPTIDE: B Natriuretic Peptide: 111.6 pg/mL — ABNORMAL HIGH (ref 0.0–100.0)

## 2021-12-11 MED ORDER — METOLAZONE 2.5 MG PO TABS: ORAL_TABLET | ORAL | 0 refills | Status: DC

## 2021-12-11 MED ORDER — POTASSIUM CHLORIDE CRYS ER 20 MEQ PO TBCR: 40.0000 meq | EXTENDED_RELEASE_TABLET | Freq: Every day | ORAL | 0 refills | Status: DC

## 2021-12-11 NOTE — Progress Notes (Signed)
?Heart and Vascular Care Navigation ? ?12/11/2021 ? ?[place] ?02-11-1956 ?[id number] ? ?Reason for Referral: paramedicine enrollment ?  ?Engaged with patient face to face for initial visit for Heart and Vascular Care Coordination. ?                                                                                                  ?Paramedicine Initial Assessment: ? ?Housing:  ?In what kind of housing do you live? House/apt/trailer/shelter? ? ?Do you rent/pay a mortgage/own? mortgage ? ?Do you live with anyone? no ? ?Are you currently worried about losing your housing? No but reports some concerns with paying utilities but no turn offs- is aware of DHHS for resources and gets L[employer] benefits ? ? ?Social:  ?What is your current marital status? widowed ? ?Do you have any children? 3 biological daughters and 2 stepsons ? ?Do you have family or friends who live locally? All the children live close and reports strong relationship with them- confirms 3 dtrs as emergency contacts ? ?Income:  ?What is your current source of income? SSDI $1300/month ? ?Insurance:  ?Are you currently insured? yes ? ?Do you have prescription coverage? Yes pays $0 for prescriptions ? ? ?Transportation:  ?Do you have transportation to your medical appointments? Yes- drives himself ? ? Daily Health Needs: ?Do you have a working scale at home? Has one at home but hasn't used in a while- will check if it works ? ?How do you manage your medications at home? Kept all the bottles in a bag and takes them out one by one and takes them- sometimes gets confusing if he has taken them yet or now ? ?Do you ever take your medications differently than prescribed? Not intentionally ? ?Do you have issues affording your medications? No- all $0 he states- wonder if he gets Extra Help ? ?Do you have any concerns with mobility at home? no ? ?Do you use any assistive devices at home or have PCS at home? no ? ?Do you have a PCP? Yes Grier Mitts- last visit  about 4 months ago ? ?Do you have any trouble reading or writing? no ? ?Are there any additional barriers you see to getting the care you need?  Reports some concerns with house condition- CSW referred to [facility] to apply for assistance with home repairs. ?                                  ? ?HRT/VAS Care Coordination   ? ? Outpatient Care Team Community Paramedicine; Social Worker  ? Social Worker Name: Tammy Sours, [facility], [phone]  ? Living arrangements for the past 2 months Single Family Home  ? Lives with: Self  ? Patient Current Insurance Coverage Managed Medicare  ? Patient Has Concern With Paying Medical Bills No  ? Does Patient Have Prescription Coverage? Yes  ? Home Assistive Devices/Equipment Blood pressure cuff; CBG Meter; Scales; Other (Comment)  CardioMems  ? ?  ? ? ?Social History:                                                                             ?  SDOH Screenings  ? ?Alcohol Screen: Low Risk   ? Last Alcohol Screening Score (AUDIT): 3  ?Depression (PHQ2-9): Low Risk   ? PHQ-2 Score: 0  ?Financial Resource Strain: Low Risk   ? Difficulty of Paying Living Expenses: Not hard at all  ?Food Insecurity: Food Insecurity Present  ? Worried About Charity fundraiser in the Last Year: Sometimes true  ? Ran Out of Food in the Last Year: Never true  ?Housing: Low Risk   ? Last Housing Risk Score: 0  ?Physical Activity: Insufficiently Active  ? Days of Exercise per Week: 1 day  ? Minutes of Exercise per Session: 10 min  ?Social Connections: Socially Isolated  ? Frequency of Communication with Friends and Family: More than three times a week  ? Frequency of Social Gatherings with Friends and Family: More than three times a week  ? Attends Religious Services: Never  ? Active Member of Clubs or Organizations: No  ? Attends Archivist Meetings: Never  ? Marital Status: Widowed  ?Stress: No Stress Concern Present  ? Feeling of Stress : Not at all  ?Tobacco Use: Medium Risk  ? Smoking  Tobacco Use: Former  ? Smokeless Tobacco Use: Never  ? Passive Exposure: Not on file  ?Transportation Needs: No Transportation Needs  ? Lack of Transportation (Medical): No  ? Lack of Transportation (Non-Medical): No  ? ? ?SDOH Interventions: ?Financial Resources:    ?Gets around $1,300/month from SSDI  ?Food Insecurity:  Food Insecurity Interventions: Intervention Not Indicated gets $23/month in food stamps- also referred to [employer] pantry  ?Housing Insecurity:  Housing Interventions: Intervention Not Indicated  ?Transportation:   Transportation Interventions: Intervention Not Indicated  ? ? ?Follow-up plan:   ? ?CSW to send out referral to paramedics for assignment ? ?Jorge Ny, LCSW ?Clinical Social Worker ?[facility] ?Desk#: [phone] ?Cell#: [phone] ? ? ? ? ?

## 2021-12-11 NOTE — Patient Instructions (Addendum)
RedsClip done today. ? ?Labs done today. We will contact you only if your labs are abnormal. ? ?TAKE Metolazone 2.'5mg'$  (1 tablet) by mouth today with one of your Torsemide doses. ? ?TAKE Potassium 63mq(2 tablets) by mouth today.  ? ?No other medication changes were made. Please continue all current medications as prescribed. ? ?Your physician recommends that you schedule a follow-up appointment in: 1 week for a lab only appointment, 3 weeks with our NP/PA Clinic and in 2-3 months with Dr. MAundra Dubinall appointments here in our office. ? ?You have been referred to ParaMedicine. Any of the following numbers may contact you: [phone] [phone]? ?If you have any questions or concerns before your next appointment please send uKoreaa message through mDuranor call our office at [phone]   ? ?TO LEAVE A MESSAGE FOR THE NURSE SELECT OPTION 2, PLEASE LEAVE A MESSAGE INCLUDING: ?YOUR NAME ?DATE OF BIRTH ?CALL BACK NUMBER ?REASON FOR CALL**this is important as we prioritize the call backs ? ?YOU WILL RECEIVE A CALL BACK THE SAME DAY AS LONG AS YOU CALL BEFORE 4:00 PM ? ? ?Do the following things EVERYDAY: ?Weigh yourself in the morning before breakfast. Write it down and keep it in a log. ?Take your medicines as prescribed ?Eat low salt foods--Limit salt (sodium) to 2000 mg per day.  ?Stay as active as you can everyday ?Limit all fluids for the day to less than 2 liters ? ? ?At the [facility] you and your health needs are our priority. As part of our continuing mission to provide you with exceptional heart care, we have created designated Provider Care Teams. These Care Teams include your primary Cardiologist (physician) and Advanced Practice Providers (APPs- Physician Assistants and Nurse Practitioners) who all work together to provide you with the care you need, when you need it.  ? ?You may see any of the following providers on your designated Care Team at your next follow up: ?Dr DGlori Bickers?Dr DLoralie Champagne?ADarrick Grinder NP ?BLyda Jester PA ?LAudry Riles PharmD ? ? ?Please be sure to bring in all your medications bottles to every appointment.  ? ?

## 2021-12-13 ENCOUNTER — Other Ambulatory Visit (HOSPITAL_COMMUNITY): Payer: Self-pay | Admitting: Family Medicine

## 2021-12-13 DIAGNOSIS — E785 Hyperlipidemia, unspecified: Secondary | ICD-10-CM

## 2021-12-18 ENCOUNTER — Other Ambulatory Visit (HOSPITAL_COMMUNITY): Payer: Medicare Other

## 2021-12-18 ENCOUNTER — Telehealth (HOSPITAL_COMMUNITY): Payer: Self-pay

## 2021-12-18 NOTE — Telephone Encounter (Signed)
Called [name] [name] to introduce myself and explain paramedicine services to him. He is agreeable and would like to set up a home visit for next Wednesday. He plans to call me back with an exact time. I will continue to follow up to clarify a time.  ? ?I also made him aware he missed his lab appointment for today, he reports he did not know anything about it. I will assist him in getting this rescheduled.  ? ?Phone call complete.  ?

## 2021-12-22 ENCOUNTER — Ambulatory Visit (HOSPITAL_COMMUNITY)
Admission: RE | Admit: 2021-12-22 | Discharge: 2021-12-22 | Disposition: A | Discharge status: Home / Self Care | Payer: Medicare Other | Source: Ambulatory Visit | Attending: Cardiology | Admitting: Cardiology

## 2021-12-22 DIAGNOSIS — I5032 Chronic diastolic (congestive) heart failure: Secondary | ICD-10-CM | POA: Insufficient documentation

## 2021-12-22 LAB — BASIC METABOLIC PANEL
Anion gap: 11 (ref 5–15)
BUN: 29 mg/dL — ABNORMAL HIGH (ref 8–23)
CO2: 26 mmol/L (ref 22–32)
Calcium: 9.8 mg/dL (ref 8.9–10.3)
Chloride: 98 mmol/L (ref 98–111)
Creatinine, Ser: 1.78 mg/dL — ABNORMAL HIGH (ref 0.61–1.24)
GFR, Estimated: 41 mL/min — ABNORMAL LOW (ref >60)
Glucose, Bld: 489 mg/dL — ABNORMAL HIGH (ref 70–99)
Potassium: 4.2 mmol/L (ref 3.5–5.1)
Sodium: 135 mmol/L (ref 135–145)

## 2021-12-27 ENCOUNTER — Telehealth (HOSPITAL_COMMUNITY): Payer: Self-pay

## 2021-12-27 DIAGNOSIS — G4733 Obstructive sleep apnea (adult) (pediatric): Secondary | ICD-10-CM | POA: Diagnosis not present

## 2021-12-27 NOTE — Telephone Encounter (Signed)
Contacted [name] [name] to attempt to schedule home paramedicine visit with no success. I will continue to reach out to schedule same.  ? ?-I also texted his cell phone asking him to call me.  ?

## 2022-01-03 ENCOUNTER — Telehealth (HOSPITAL_COMMUNITY): Payer: Self-pay

## 2022-01-03 NOTE — Progress Notes (Signed)
Patient ID: [name] [name], male   DOB: [birth year], 66 y.o.   MRN: [id number] PCP: [name], MD HF Cardiology: Dr. Aundra Dubin  66 y.o. with history of HTN, diabetes, paroxysmal atrial fibrillation, and CAD presents for cardiology followup.  He was admitted in 5/13 with chest pain with exertion.  LHC was done showing global HK with EF 45% and diffuse, severe distal and branch vessel disease.  There was no interventional option, but it was suspect that this disease could be causing his anginal-type pain.  Echo at that time was read as showing EF 60-65%.  He was admitted in 5/14 with hypertensive urgency and chest pain.  He ruled out for MI and BP was controlled.  His EF was 50-55% by echo.  ETT-Sestamibi done as outpatient showed small fixed inferior defect with no ischemia but EF was 31%.  He was quite hypertensive during the study.  Cardiac MRI done to confirm EF in 5/14 showed EF 44%.  He had an episode of atrial fibrillation/RVR in 3/15 but went back into NSR.  Echo in 2/16 showed EF 55-60% with moderate diastolic dysfunction.   He had a Cardiolite in 9/16 showing inferior ischemia.  He was having exertional dyspnea with less activity than normal.  There was concern for worsened CAD and he was sent for [facility] in 9/16. By RHC, filling pressures were not significantly elevated. He had a severe distal RCA stenosis that was treated with DES x 2.  Echo showed EF 55-60%.    In 10/16, he was admitted with acute cholecystitis.  He received a cholecystostomy tube.  Cholecystectomy done in 6/17.   Atrial fibrillation in 1/17, cardioverted to NSR.   Recurrent concerning chest pain in 3/17.  Repeat LHC showed diffuse distal and branch vessel disease consistent with poorly-controlled DM, but patent RCA stents.    He was admitted with atrial fibrillation in 12/17. Started on sotalol and converted to NSR.    He had recurrent chest pain in 5/18 with abnormal Cardiolite.  Cath was done again in 5/18,  showing diffuse CAD with no good interventional options.   Admitted 11/20 with increased dyspnea and exertional chest pain. Hgb went down to 8.1. GI consulted.  Had [place] that showed severe distal and branch vessel disease but no interventional targets and not good anatomy for CABG. Continued on medical management. Capsule endoscopy without recent or active bleeding, no AVMs. He went into atrial flutter during his hospitalization. He went back into NSR, however, after discharge.  +Snoring and daytime sleepiness.   He has had episodes of syncope associated with vigorous coughing.   He was in the ER with atrial flutter and dyspnea in 4/21. Zio monitor in 9/22 showed NSR with PACs.   Follow up 1/23 not compliant with medications or Cardiomems.   Today he returns for HF follow up. He has struggled with volume overload for last several visits. He is not using his Cardiomems > 3 months. Overall feeling fair. He has mild dyspnea walking on flat ground. Denies CP, palpitations, dizziness, edema, or PND/Orthopnea. Appetite ok, continues to drink a lot of fluid. No fever or chills. He is not weighing at home. He is not taking meds regularly. Wears CPAP 2-3 nights/week. Paramedicine unable to make contact for first visit.   REDs: 42%  ECG (personally reviewed): SR w AVB, PR 232 msec, QTc 474 msec  Cardiomems: No readings in > 3 months.  Labs (5/13): K 3.6, creatinine 0.93, LDL 85, HDL 57 Labs (  7/13): K 3.9, creatinine 1.1, LDL 57, HDL 51  Labs (5/14): K 4, creatinine 1.25 Labs (8/14): K 3.7, creatinine 0.9, LDL 103, HDL 42 Labs (8/15): K 3.5 creatinine 1.0, BNP 75 Labs (2/16): K 3.8, creatinine 1.1 Labs (9/16): K 3.5, creatinine 1.03, BNP 59, HDL 40, LDL 111, TGs 333 Labs (11/16): K 3.6, creaitnine 1.07, LFTs normal, LDL 44 Labs (6/17): K 3.4, creatinine 1.52 => 1.13 Labs (1/18): K 3.9, creatinine 1.25 Labs (6/18): K 3.8, creatinine 1.21, HCT 39.7 Labs (8/18): LDL 39, HDL 43 Labs (12/18): 4.4,  creatinine 1.25, LFTs normal Labs (8/19): K 3.6, creatinine 1.13, LDL 68, HDL 49  Labs (11/20): K 3.5, creatinine 1.33, LDL 25 Labs (4/21): K 4.0, SCr 1.14, BNP 266  Labs (6/21): K 3.7, creatinine 1.25 Labs (12/21): LDL 25 Labs (1/22): BNP 132 Labs (2/22): K 3.4, creatinine 1.44 Labs (8/22): LDL 81 Labs (9/22): K 3.2, creatinine 1.53 Labs (10/22): K 3.7, creatinine 1.39 Labs (11/22): K 4.4, creatinine 1.47 Labs (3/23): K 4.4, creatinine 1.44 Labs (5/23): K 4.2, creatinine 1.78  PMH: 1. DM2 2. Gout 3. HTN: Cough with ACEI.  4. Cardiomyopathy: Suspect mixed ischemic/nonischemic (?ETOH-related).  EF 35% in 2008.  Echo in 5/13 showed EF 60-65% with moderate LVH but EF was 45% on LV-gram in 5/13.  Echo (5/14) with EF 50-55%, mild LVH, inferobasal HK, mild MR.  ETT-Sestamibi in 5/14, however, showed EF 31%.  Cardiac MRI (6/14) with EF 44%, mild global hypokinesis, subepicardial delayed enhancement in a nonspecific RV insertion site pattern.  Echo (2/16) with EF 55-60%, moderate diastolic dysfunction. RHC (9/16) with mean RA 8, PA 39/20 mean 28, mean PCWP 14, CI 2.2. Echo (9/16) with EF 55-60%, grade II diastolic dysfunction.  - Echo (6/17) with EF 55-60%, moderate LVH, grade II diastolic dysfunction, mild AI, mild MR.  - RHC (11/20): mean RA 4, PA 46/14 mean 26, mean PCWP 13, CI 3.23 - Echo (9/20): EF 60-65%, moderate LVH, normal RV size and systolic function, mild-moderate MR, mild AI.  - Echo (5/21): EF 55%, RV normal - Has Cardiomems.  5. CAD: LHC in 2010 with mild nonobstructive disease.  LHC (5/13) with diffuse distal and branch vessel disease, mild global hypokinesis and EF 45%.  ETT-Sestamibi (5/14): EF 31%, small fixed inferior defect with no ischemia.  Lexiscan Cardiolite (9/16) with EF 26%, moderate inferior defect that was partially reversible, suggesting ischemia => High risk study. LHC (9/16) with 40-50% mLAD, diffuse up to 50% distal LAD, 90% ostium of branch off ramus, 95% dRCA =>  DES to RCA x 2, EF 55-60%.   - LHC (3/17) with diffuse branch and distal vessel disease c/w poorly controlled DM, RCA stents patent.  - LHC (5/18): Diffuse CAD with no good interventional option. 60% mRCA, 90% dLAD, 50% serial mid ramus stenoses, ramus branches with ostial 80-90% stenoses. - LHC (11/20): Severe distal and branch vessel disease but no interventional targets and not good anatomy for CABG  6. H/o chronic HBV 7. Osteochondroma left shoulder.  8. Hyperlipidemia 9. Paroxysmal atrial fibrillation: Coumadin.  Developed cough and increased ESR with amiodarone.  DCCV in 1/17.  Recurrent atrial fibrillation in 12/17, sotalol started.  10. Atrial flutter: had ablations in 2007 and 2008.  - Zio patch in 4/21 with 20% atrial flutter.  11. OSA on CPAP.  12. Acute cholecystitis (10/16): Cholecystostomy tube placed.  Cholecystectomy 6/17.  13. CKD 14. Anemia - Suspected GI bleed but EGD/c-scope 9/20 without significant findings.  - Capsule Endoscopy (11/20)  without recent or active bleeding, no AVMs.  15. Syncope: Suspected cough syncope.  16. OSA  SH: Married, prior smoker.  Some ETOH, occasionally heavy in past.  Does use occasional marijuana. Out of work Engineer, drilling. 2 daughters in grad school.   FH: CAD  ROS: All systems reviewed and negative except as per HPI.   Current Outpatient Medications  Medication Sig Dispense Refill   ACCU-CHEK SOFTCLIX LANCETS lancets Use as instructed for three times daily testing of blood glucose 100 each 12   acetaminophen (TYLENOL) 500 MG tablet Take 1,000 mg by mouth every 6 (six) hours as needed (pain).     albuterol (VENTOLIN HFA) 108 (90 Base) MCG/ACT inhaler Inhale 1-2 puffs into the lungs every 6 (six) hours as needed for wheezing or shortness of breath. 8 g 0   amLODipine (NORVASC) 10 MG tablet Take 1 tablet (10 mg total) by mouth daily. 90 tablet 3   apixaban (ELIQUIS) 5 MG TABS tablet Take 1 tablet (5 mg total) by mouth 2 (two) times daily. 60  tablet 6   atorvastatin (LIPITOR) 80 MG tablet Take 1 tablet by mouth once daily 90 tablet 3   blood glucose meter kit and supplies KIT Dispense based on patient and insurance preference. Use up to four times daily as directed. (FOR ICD-9 250.00, 250.01). 1 each 0   Blood Glucose Monitoring Suppl (ACCU-CHEK AVIVA PLUS) w/Device KIT Use as directed for 3 times daily testing of blood glucose 1 kit 0   carvedilol (COREG) 25 MG tablet TAKE 1 TABLET BY MOUTH TWICE DAILY WITH A MEAL 180 tablet 3   colchicine 0.6 MG tablet On day 1 take 2 tablets at first sign of gout flare.  Take 1 tablet 1 hour later.  Then take 1 tablet daily starting the next day until resolution of gout flare. (Patient taking differently: On day 1 take 2 tablets at first sign of gout flare.  Take 1 tablet 1 hour later.  Then take 1 tablet daily starting the next day until resolution of gout flare as needed.) 10 tablet 1   empagliflozin (JARDIANCE) 10 MG TABS tablet Take 1 tablet (10 mg total) by mouth daily before breakfast. 30 tablet 11   glucose blood (ACCU-CHEK AVIVA PLUS) test strip Use as instructed for 3 times daily testing of blood glucose 100 each 12   guaiFENesin (MUCINEX PO) Take 1 tablet by mouth 2 (two) times daily as needed (cough/congestion).      hydrALAZINE (APRESOLINE) 50 MG tablet TAKE 1 & 1/2 (ONE & ONE-HALF) TABLETS BY MOUTH TWICE DAILY. MUST MAKE FOLLOW UP APPT FOR FURTHER REFILLS 90 tablet 5   insulin glargine (LANTUS SOLOSTAR) 100 UNIT/ML Solostar Pen Inject 40 Units into the skin every morning. And pen needles 1/day 45 mL 3   Insulin Pen Needle (PEN NEEDLES) 32G X 4 MM MISC 1 each by Does not apply route daily. 100 each 3   Lancet Devices (ACCU-CHEK SOFTCLIX) lancets Use as instructed for 3 times daily testing of blood glucose 1 each 0   meclizine (ANTIVERT) 12.5 MG tablet Take 1 tablet (12.5 mg total) by mouth 3 (three) times daily as needed for dizziness. 12 tablet 0   metFORMIN (GLUCOPHAGE) 1000 MG tablet Take  1,000 mg by mouth daily with breakfast.     nitroGLYCERIN (NITROSTAT) 0.4 MG SL tablet Place 1 tablet (0.4 mg total) under the tongue every 5 (five) minutes as needed for chest pain (x 3 tabs daily). 60 tablet 1  sotalol (BETAPACE) 120 MG tablet Take 1 tablet (120 mg total) by mouth 2 (two) times daily. 180 tablet 3   TRUEPLUS LANCETS 28G MISC 1 each by Does not apply route 3 (three) times daily. 100 each 12   eplerenone (INSPRA) 25 MG tablet Take 1 tablet (25 mg total) by mouth daily. (Patient not taking: Reported on 01/05/2022) 30 tablet 11   isosorbide mononitrate (IMDUR) 120 MG 24 hr tablet Take 1 tablet (120 mg total) by mouth daily. (Patient not taking: Reported on 01/05/2022) 30 tablet 6   torsemide (DEMADEX) 20 MG tablet Take 80 mg by mouth 2 (two) times daily. (Patient not taking: Reported on 01/05/2022)     No current facility-administered medications for this encounter.   BP (!) 144/80   Pulse 73   Wt 94.3 kg (207 lb 12.8 oz)   SpO2 97%   BMI 28.18 kg/m    Wt Readings from Last 3 Encounters:  01/05/22 94.3 kg (207 lb 12.8 oz)  12/11/21 95.5 kg (210 lb 9.6 oz)  10/11/21 91.4 kg (201 lb 9.6 oz)   PHYSICAL EXAM: General:  NAD. No resp difficulty HEENT: Normal Neck: Supple. No JVD. Carotids 2+ bilat; no bruits. No lymphadenopathy or thryomegaly appreciated. Cor: PMI nondisplaced. Regular rate & rhythm. No rubs, gallops or murmurs. Lungs: Clear Abdomen: Soft, nontender, nondistended. No hepatosplenomegaly. No bruits or masses. Good bowel sounds. Extremities: No cyanosis, clubbing, rash, edema Neuro: Alert & oriented x 3, cranial nerves grossly intact. Moves all 4 extremities w/o difficulty. Affect pleasant.  Assessment/Plan: 1. Chronic diastolic CHF: EF 16% on cardiac MRI in 6/14 but EF improved back to 55-60% on 2/16 echo.  However, EF down to 26% on Cardiolite in 9/16.  Echo in 9/20 showed EF 60-65%, moderate LVH, normal RV, mild-moderate MR.  Echo in 5/21 with EF 55%, normal  RV.  He has a Cardiomems device, but has not used in 3 months. He does not appear markedly volume overloaded on exam, ReDs elevated 42%. Suspect due to medication non-compliance and increased fluid intake. NYHA class II- early III symptoms.  - Restart eplerenone 25 mg daily (gynecomastia with spiro in past). BMET today. - Restart torsemide 80 mg bid.  - Continue empagliflozin 10 mg daily.  - Continue hydralazine 75 mg bid (take the pm dose) + restart Imdur 120 mg daily. - Continue carvedilol 25 mg bid. - I encouraged him to send Cardiomems at least 2-3 times/week.  - Paramedicine has been arranged, Heather having difficulty making contact. I asked Suyash to call her to schedule visit. 2. Atrial fibrillation/flutter: Paroxysmal. He has had atrial flutter ablations x 2 remotely.  He is on sotalol. He tolerates atrial arrhythmias poorly, seems to worsen CHF.  Zio patch in 4/21 with 20% atrial flutter. He saw EP, repeat ablation was not recommended. Repeat Zio in 9/22 showed only NSR and PADs.   - Continue sotalol, QTc acceptable on ECG today.   - Continue Eliquis. 3. Coronary artery disease: LHC in 9/16 showed diffuse CAD, most significant involving the distal RCA.  Patient had DES x 2 to RCA.  Cath in 3/17 with patent RCA stents and diffuse distal and branch vessel disease concerning for poorly controlled diabetes.  Cath in 5/18 again showed diffuse CAD without good interventional target.  Cath in 11/20 showed severe distal and branch vessel disease but no interventional targets and not good anatomy for CABG.  No recent chest pain. - No ASA given Eliquis use.  - Continue atorvastatin 80  mg daily. - Continue Coreg, amlodipine, and Imdur daily for angina control.  - Avoid ranolazine with sotalol use.  4. Hyperlipidemia:  On atorvastatin, good lipids 1/23.   5. Hypertension: Elevated today. - Restart meds as above. 6. OSA: Continue CPAP. 7. CKD stable 3: BMET today.  8. DM: On SGLT2i + insulin.  -  Blood sugars uncontrolled lately, advised following up with PCP. 9. Non-compliance: Not taking medications regularly. Paramedicine arranged last visit but have been unable to arrange home visit. - Difficult situation. Discussed importance of medication regimen.  Follow up in 4 weeks with APP and in 2 months with Dr. Aundra Dubin, as scheduled.  [facility] FNP-BC 01/05/2022

## 2022-01-03 NOTE — Telephone Encounter (Signed)
Attempted to reach out to patient for paramedicine visits but no answer and no option to leave a voicemail. I texted him and I will attempt to continue to reach out.

## 2022-01-04 ENCOUNTER — Telehealth (HOSPITAL_COMMUNITY): Payer: Self-pay

## 2022-01-04 NOTE — Telephone Encounter (Signed)
Called and spoke to [place] patient's daughter to confirm/remind patient of their appointment at the [facility] on 01/05/22.

## 2022-01-05 ENCOUNTER — Ambulatory Visit (HOSPITAL_COMMUNITY)
Admission: RE | Admit: 2022-01-05 | Discharge: 2022-01-05 | Disposition: A | Discharge status: Home / Self Care | Payer: Medicare Other | Source: Ambulatory Visit | Attending: Family Medicine | Admitting: Family Medicine

## 2022-01-05 ENCOUNTER — Encounter (HOSPITAL_COMMUNITY): Payer: Self-pay

## 2022-01-05 ENCOUNTER — Telehealth: Payer: Medicare Other

## 2022-01-05 VITALS — BP 144/80 | HR 73 | Wt 207.8 lb

## 2022-01-05 DIAGNOSIS — I4892 Unspecified atrial flutter: Secondary | ICD-10-CM | POA: Diagnosis not present

## 2022-01-05 DIAGNOSIS — Z91199 Patient's noncompliance with other medical treatment and regimen due to unspecified reason: Secondary | ICD-10-CM | POA: Insufficient documentation

## 2022-01-05 DIAGNOSIS — I2510 Atherosclerotic heart disease of native coronary artery without angina pectoris: Secondary | ICD-10-CM | POA: Diagnosis not present

## 2022-01-05 DIAGNOSIS — E1122 Type 2 diabetes mellitus with diabetic chronic kidney disease: Secondary | ICD-10-CM | POA: Diagnosis not present

## 2022-01-05 DIAGNOSIS — I340 Nonrheumatic mitral (valve) insufficiency: Secondary | ICD-10-CM | POA: Diagnosis not present

## 2022-01-05 DIAGNOSIS — N183 Chronic kidney disease, stage 3 unspecified: Secondary | ICD-10-CM | POA: Diagnosis not present

## 2022-01-05 DIAGNOSIS — I480 Paroxysmal atrial fibrillation: Secondary | ICD-10-CM

## 2022-01-05 DIAGNOSIS — Z8639 Personal history of other endocrine, nutritional and metabolic disease: Secondary | ICD-10-CM | POA: Diagnosis not present

## 2022-01-05 DIAGNOSIS — I5032 Chronic diastolic (congestive) heart failure: Secondary | ICD-10-CM | POA: Diagnosis not present

## 2022-01-05 DIAGNOSIS — G4733 Obstructive sleep apnea (adult) (pediatric): Secondary | ICD-10-CM

## 2022-01-05 DIAGNOSIS — Z7984 Long term (current) use of oral hypoglycemic drugs: Secondary | ICD-10-CM | POA: Diagnosis not present

## 2022-01-05 DIAGNOSIS — Z79899 Other long term (current) drug therapy: Secondary | ICD-10-CM | POA: Diagnosis not present

## 2022-01-05 DIAGNOSIS — N1831 Chronic kidney disease, stage 3a: Secondary | ICD-10-CM | POA: Diagnosis not present

## 2022-01-05 DIAGNOSIS — E785 Hyperlipidemia, unspecified: Secondary | ICD-10-CM

## 2022-01-05 DIAGNOSIS — I25119 Atherosclerotic heart disease of native coronary artery with unspecified angina pectoris: Secondary | ICD-10-CM | POA: Insufficient documentation

## 2022-01-05 DIAGNOSIS — Z7901 Long term (current) use of anticoagulants: Secondary | ICD-10-CM | POA: Diagnosis not present

## 2022-01-05 DIAGNOSIS — I130 Hypertensive heart and chronic kidney disease with heart failure and stage 1 through stage 4 chronic kidney disease, or unspecified chronic kidney disease: Secondary | ICD-10-CM | POA: Diagnosis not present

## 2022-01-05 DIAGNOSIS — Z91148 Patient's other noncompliance with medication regimen for other reason: Secondary | ICD-10-CM | POA: Diagnosis not present

## 2022-01-05 DIAGNOSIS — Z794 Long term (current) use of insulin: Secondary | ICD-10-CM | POA: Diagnosis not present

## 2022-01-05 DIAGNOSIS — Z87891 Personal history of nicotine dependence: Secondary | ICD-10-CM | POA: Diagnosis not present

## 2022-01-05 DIAGNOSIS — Z955 Presence of coronary angioplasty implant and graft: Secondary | ICD-10-CM | POA: Insufficient documentation

## 2022-01-05 DIAGNOSIS — I10 Essential (primary) hypertension: Secondary | ICD-10-CM

## 2022-01-05 LAB — BASIC METABOLIC PANEL
Anion gap: 9 (ref 5–15)
BUN: 16 mg/dL (ref 8–23)
CO2: 21 mmol/L — ABNORMAL LOW (ref 22–32)
Calcium: 9.2 mg/dL (ref 8.9–10.3)
Chloride: 104 mmol/L (ref 98–111)
Creatinine, Ser: 1.33 mg/dL — ABNORMAL HIGH (ref 0.61–1.24)
GFR, Estimated: 59 mL/min — ABNORMAL LOW (ref >60)
Glucose, Bld: 262 mg/dL — ABNORMAL HIGH (ref 70–99)
Potassium: 4.2 mmol/L (ref 3.5–5.1)
Sodium: 134 mmol/L — ABNORMAL LOW (ref 135–145)

## 2022-01-05 NOTE — Progress Notes (Signed)
ReDS Vest / Clip - 01/05/22 0800       ReDS Vest / Clip   Station Marker C    Ruler Value 29.5    ReDS Value Range High volume overload    ReDS Actual Value 42

## 2022-01-05 NOTE — Patient Instructions (Addendum)
Thank you for coming in today   Labs were done today, if any labs are abnormal the clinic will call you No news is good news  Your physician recommends that you schedule a follow-up appointment in:  3-4 weeks in clinic Keep follow up with Dr. Aundra Dubin  At the [facility], you and your health needs are our priority. As part of our continuing mission to provide you with exceptional heart care, we have created designated Provider Care Teams. These Care Teams include your primary Cardiologist (physician) and Advanced Practice Providers (APPs- Physician Assistants and Nurse Practitioners) who all work together to provide you with the care you need, when you need it.   You may see any of the following providers on your designated Care Team at your next follow up: Dr Glori Bickers Dr Haynes Kerns, NP Lyda Jester, [place] [facility], [place] Audry Riles, PharmD   Please be sure to bring in all your medications bottles to every appointment.   If you have any questions or concerns before your next appointment please send [place] a message through [address] or call our office at [phone].    TO LEAVE A MESSAGE FOR THE NURSE SELECT OPTION 2, PLEASE LEAVE A MESSAGE INCLUDING: YOUR NAME DATE OF BIRTH CALL BACK NUMBER REASON FOR CALL**this is important as we prioritize the call backs  YOU WILL RECEIVE A CALL BACK THE SAME DAY AS LONG AS YOU CALL BEFORE 4:00 PM

## 2022-01-10 ENCOUNTER — Telehealth (HOSPITAL_COMMUNITY): Payer: Self-pay

## 2022-01-10 NOTE — Telephone Encounter (Signed)
Left message for Mr. Nicotra to reach out to establish paramedicine visits. I will continue to reach out.

## 2022-01-12 ENCOUNTER — Ambulatory Visit (INDEPENDENT_AMBULATORY_CARE_PROVIDER_SITE_OTHER): Payer: Medicare Other

## 2022-01-12 DIAGNOSIS — I5032 Chronic diastolic (congestive) heart failure: Secondary | ICD-10-CM

## 2022-01-12 DIAGNOSIS — I10 Essential (primary) hypertension: Secondary | ICD-10-CM

## 2022-01-12 DIAGNOSIS — E1165 Type 2 diabetes mellitus with hyperglycemia: Secondary | ICD-10-CM

## 2022-01-12 DIAGNOSIS — I25110 Atherosclerotic heart disease of native coronary artery with unstable angina pectoris: Secondary | ICD-10-CM

## 2022-01-12 DIAGNOSIS — E7800 Pure hypercholesterolemia, unspecified: Secondary | ICD-10-CM

## 2022-01-12 DIAGNOSIS — I4891 Unspecified atrial fibrillation: Secondary | ICD-10-CM

## 2022-01-12 NOTE — Patient Instructions (Signed)
Visit Information  Thank you for taking time to visit with me today. Please don't hesitate to contact me if I can be of assistance to you before our next scheduled telephone appointment.  Following are the goals we discussed today:  Take all medications as prescribed Attend all scheduled provider appointments Call pharmacy for medication refills 3-7 days in advance of running out of medications Call provider office for new concerns or questions  call office if I gain more than 2 pounds in one day or 5 pounds in one week keep legs up while sitting watch for swelling in feet, ankles and legs every day weigh myself daily track symptoms and what helps feel better or worse Send Cardiomem reading daily schedule appointment with eye doctor check blood sugar at prescribed times: once daily and when you have symptoms of low or high blood sugar check feet daily for cuts, sores or redness take the blood sugar log to all doctor visits fill half of plate with vegetables limit fast food meals to no more than 1 per week manage portion size switch to sugar-free drinks wash and dry feet carefully every day check pulse (heart) rate once a day make a plan to eat healthy take medicine as prescribed check blood pressure daily choose a place to take my blood pressure (home, clinic or office, retail store) keep a blood pressure log take blood pressure log to all doctor appointments take medications for blood pressure exactly as prescribed eat more whole grains, fruits and vegetables, lean meats and healthy fats limit salt intake to '2000mg'$ /day call for medicine refill 2 or 3 days before it runs out take all medications exactly as prescribed call doctor with any symptoms you believe are related to your medicine  Our next appointment is by telephone on 02/16/22 at 11:30 AM  Please call the care guide team at [phone] if you need to cancel or reschedule your appointment.   If you are experiencing a  Mental Health or [place] or need someone to talk to, please call the Suicide and Crisis Lifeline: 988 call the [place] National Suicide Prevention Lifeline: 612-201-0424 or TTY: 3032389068 TTY 619 129 2469) to talk to a trained counselor call 1-800-273-TALK (toll free, 24 hour hotline) go to [facility] [address] [place] [phone]) call 911   Patient verbalizes understanding of instructions and care plan provided today and agrees to view in [place]. Active MyChart status and patient understanding of how to access instructions and care plan via MyChart confirmed with patient.     Peter Garter RN, Jackquline [place], CDE Care Management Coordinator [facility] [phone]

## 2022-01-12 NOTE — Chronic Care Management (AMB) (Signed)
Chronic Care Management   CCM RN Visit Note  01/12/2022 Name: [name] [name] MRN: [id number] DOB: 1956/08/13  Subjective: [name] [name] is a 66 y.o. year old male who is a primary care patient of [name], MD. The care management team was consulted for assistance with disease management and care coordination needs.    Engaged with patient by telephone for follow up visit in response to provider referral for case management and/or care coordination services.   Consent to Services:  The patient was given information about Chronic Care Management services, agreed to services, and gave verbal consent prior to initiation of services.  Please see initial visit note for detailed documentation.   Patient agreed to services and verbal consent obtained.   Assessment: Review of patient past medical history, allergies, medications, health status, including review of consultants reports, laboratory and other test data, was performed as part of comprehensive evaluation and provision of chronic care management services.   SDOH (Social Determinants of Health) assessments and interventions performed:    CCM Care Plan  Allergies  Allergen Reactions   Shrimp [Shellfish Allergy] Nausea And Vomiting   Ace Inhibitors Cough   Other Hives and Other (See Comments)    Patient reports developing hives after receiving "some antibiotic given in 1980''s at [facility]". He does not know which antibiotic.    Outpatient Encounter Medications as of 01/12/2022  Medication Sig   ACCU-CHEK SOFTCLIX LANCETS lancets Use as instructed for three times daily testing of blood glucose   acetaminophen (TYLENOL) 500 MG tablet Take 1,000 mg by mouth every 6 (six) hours as needed (pain).   albuterol (VENTOLIN HFA) 108 (90 Base) MCG/ACT inhaler Inhale 1-2 puffs into the lungs every 6 (six) hours as needed for wheezing or shortness of breath.   amLODipine (NORVASC) 10 MG tablet Take 1 tablet (10 mg  total) by mouth daily.   apixaban (ELIQUIS) 5 MG TABS tablet Take 1 tablet (5 mg total) by mouth 2 (two) times daily.   atorvastatin (LIPITOR) 80 MG tablet Take 1 tablet by mouth once daily   blood glucose meter kit and supplies KIT Dispense based on patient and insurance preference. Use up to four times daily as directed. (FOR ICD-9 250.00, 250.01).   Blood Glucose Monitoring Suppl (ACCU-CHEK AVIVA PLUS) w/Device KIT Use as directed for 3 times daily testing of blood glucose   carvedilol (COREG) 25 MG tablet TAKE 1 TABLET BY MOUTH TWICE DAILY WITH A MEAL   colchicine 0.6 MG tablet On day 1 take 2 tablets at first sign of gout flare.  Take 1 tablet 1 hour later.  Then take 1 tablet daily starting the next day until resolution of gout flare. (Patient taking differently: On day 1 take 2 tablets at first sign of gout flare.  Take 1 tablet 1 hour later.  Then take 1 tablet daily starting the next day until resolution of gout flare as needed.)   empagliflozin (JARDIANCE) 10 MG TABS tablet Take 1 tablet (10 mg total) by mouth daily before breakfast.   eplerenone (INSPRA) 25 MG tablet Take 1 tablet (25 mg total) by mouth daily. (Patient not taking: Reported on 01/05/2022)   glucose blood (ACCU-CHEK AVIVA PLUS) test strip Use as instructed for 3 times daily testing of blood glucose   guaiFENesin (MUCINEX PO) Take 1 tablet by mouth 2 (two) times daily as needed (cough/congestion).    hydrALAZINE (APRESOLINE) 50 MG tablet TAKE 1 & 1/2 (ONE & ONE-HALF) TABLETS BY  MOUTH TWICE DAILY. MUST MAKE FOLLOW UP APPT FOR FURTHER REFILLS   insulin glargine (LANTUS SOLOSTAR) 100 UNIT/ML Solostar Pen Inject 40 Units into the skin every morning. And pen needles 1/day   Insulin Pen Needle (PEN NEEDLES) 32G X 4 MM MISC 1 each by Does not apply route daily.   isosorbide mononitrate (IMDUR) 120 MG 24 hr tablet Take 1 tablet (120 mg total) by mouth daily. (Patient not taking: Reported on 01/05/2022)   Lancet Devices (ACCU-CHEK  SOFTCLIX) lancets Use as instructed for 3 times daily testing of blood glucose   meclizine (ANTIVERT) 12.5 MG tablet Take 1 tablet (12.5 mg total) by mouth 3 (three) times daily as needed for dizziness.   metFORMIN (GLUCOPHAGE) 1000 MG tablet Take 1,000 mg by mouth daily with breakfast.   nitroGLYCERIN (NITROSTAT) 0.4 MG SL tablet Place 1 tablet (0.4 mg total) under the tongue every 5 (five) minutes as needed for chest pain (x 3 tabs daily).   sotalol (BETAPACE) 120 MG tablet Take 1 tablet (120 mg total) by mouth 2 (two) times daily.   torsemide (DEMADEX) 20 MG tablet Take 80 mg by mouth 2 (two) times daily. (Patient not taking: Reported on 01/05/2022)   TRUEPLUS LANCETS 28G MISC 1 each by Does not apply route 3 (three) times daily.   No facility-administered encounter medications on file as of 01/12/2022.    Patient Active Problem List   Diagnosis Date Noted   Syncope 02/09/2020   Generalized weakness 06/27/2019   Acute on chronic congestive heart failure with left ventricular diastolic dysfunction ([place]) 06/26/2019   Anemia due to chronic kidney disease 06/26/2019   CKD (chronic kidney disease), stage III ([place]) 06/26/2019   Type 2 diabetes mellitus without complication ([place]) 22/48/2500   Iron deficiency anemia due to chronic blood loss 06/26/2019   History of atrial fibrillation    History of type 2 diabetes mellitus    Anemia    Chest pain 04/24/2019   Overweight (BMI 25.0-29.9) 02/28/2017   Unstable angina ([place]) 08/03/2016   S/P laparoscopic cholecystectomy 02/07/2016   Coronary artery disease involving native coronary artery with unstable angina pectoris ([place]) 12/29/2015   Diabetes ([place]) 11/12/2015   Chronic anticoagulation 08/22/2015   Atrial flutter with controlled response ([place]) 08/22/2015   RUQ pain    Cholelithiases- drain in place 06/04/2015   Essential hypertension    Chronic diastolic CHF (congestive heart failure) ([place]) 04/24/2015   Excessive daytime sleepiness 11/25/2014    Obstructive sleep apnea 11/25/2014   NICM (nonischemic cardiomyopathy) ([place]) 03/11/2014   Anticoagulation goal of INR 2 to 3 10/19/2013   Eczema 10/19/2013   Low libido 10/16/2013   Persistent atrial fibrillation ([place]) 10/06/2013   Rectal bleeding 12/18/2011   HYPERCHOLESTEROLEMIA 07/11/2010   DENTAL CARIES 06/30/2010   GOUT 05/05/2009   HEPATITIS B, CHRONIC 12/30/2006   OSTEOCHONDROMA 12/30/2006   Mitral valve disorder 12/30/2006   LIVER FUNCTION TESTS, ABNORMAL, HX OF 12/30/2006   COLONIC POLYPS, HX OF 12/30/2006   COLONOSCOPY, HX OF 05/12/2001    Conditions to be addressed/monitored:Atrial Fibrillation, CHF, CAD, HTN, HLD, and DMII  Care Plan : RN Care Manager Plan of Care  Updates made by Dimitri Ped, RN since 01/12/2022 12:00 AM     Problem: Chronic Disease Management and Care Coordination Needs (CHF,CAD,Atrial Fib, DMHTN and HLD)   Priority: High     Long-Range Goal: Establish Plan of Care for Chronic Disease Management Needs (CHF,CAD,Atrial Fib, DM,HTN and HLD)   Start Date: 08/25/2021  Expected  End Date: 08/22/2022  Priority: High  Note:   Current Barriers:  Knowledge Deficits related to plan of care for management of Atrial Fibrillation, CHF, CAD, HTN, HLD, and DMII  Care Coordination needs related to Lacks knowledge of community resource: home repair Chronic Disease Management support and education needs related to Atrial Fibrillation, CHF, CAD, HTN, HLD, and DMII  States he has not been weighing daily and not sending his CardioMem to the HF clinic. States he did weigh a few days ago and his weight was 202. States he just does not  remember or think about doing it. States he does have a little shortness of breath with exertion.  Denies any swelling or chest pains.  States he still sometimes forgets to take his PM medications usually about 2-3 times a week.  States he does not use a pill box but he does have a pill box. Pt continues to declines pharmacy referral.  States he continues eat fast food 2-3 times a week such as [employer] or [employer] when he is keeping his grandson.   States he has not checked  his B/P recently.  States he has not been checking his CBG regularly and it was 282 a few days ago when he checked.  States  he has not take his insulin in a day or so.  States he is now changing the needle on his pen and it is not hurting now.   States he has not using his CPAP every night. States he has the number for the paramedic who wants to come visit him and he plans to call her today. RNCM Clinical Goal(s):  Patient will verbalize understanding of plan for management of Atrial Fibrillation, CHF, CAD, HTN, HLD, and DMII as evidenced by voiced adherence to plan of care verbalize basic understanding of  Atrial Fibrillation, CHF, CAD, HTN, HLD, and DMII disease process and self health management plan as evidenced by voiced understanding and teach back take all medications exactly as prescribed and will call provider for medication related questions as evidenced by dispense report and pt verbalization attend all scheduled medical appointments: Heart Failure Clinic 01/31/22 as evidenced by medical records demonstrate Improved adherence to prescribed treatment plan for Atrial Fibrillation, CHF, CAD, HTN, HLD, and DMII as evidenced by readings within limits, voiced adherence to plan of care continue to work with RN Care Manager to address care management and care coordination needs related to  Atrial Fibrillation, CHF, CAD, HTN, HLD, and DMII as evidenced by adherence to CM Team Scheduled appointments work with [employer] care guide to address needs related to  [employer] knowledge of community resource: home repairs as evidenced by patient and/or community resource care guide support through collaboration with Consulting civil engineer, provider, and care team.   Interventions: 1:1 collaboration with primary care provider regarding development and update of comprehensive plan  of care as evidenced by provider attestation and co-signature Inter-disciplinary care team collaboration (see longitudinal plan of care) Evaluation of current treatment plan related to  self management and patient's adherence to plan as established by provider Communicated with paramedic with HF clinic about pt wanting to schedule home visit with her   AFIB Interventions: (Status:  Goal on track:  Yes.) Long Term Goal   Counseled on increased risk of stroke due to Afib and benefits of anticoagulation for stroke prevention Reviewed importance of adherence to anticoagulant exactly as prescribed Counseled on bleeding risk associated with Eliquis and importance of self-monitoring for signs/symptoms of bleeding Counseled on  avoidance of NSAIDs due to increased bleeding risk with anticoagulants Counseled on seeking medical attention after a head injury or if there is blood in the urine/stool Reviewed importance of taking his night time dose of Eliquis every day   CAD Interventions: (Status:  Goal on track:  Yes.) Long Term Goal Assessed understanding of CAD diagnosis Medications reviewed including medications utilized in CAD treatment plan Provided education on importance of blood pressure control in management of CAD Provided education on Importance of limiting foods high in cholesterol Reviewed Importance of taking all medications as prescribed Advised to report any changes in symptoms or exercise tolerance Reinforced signs and symptoms to call doctor and when to call 911   Heart Failure Interventions:  (Status:  Goal on track:  NO.) Long Term Goal Basic overview and discussion of pathophysiology of Heart Failure reviewed Provided education on low sodium diet Reviewed Heart Failure Action Plan in depth and provided written copy Provided education about placing scale on hard, flat surface Advised patient to weigh each morning after emptying bladder Discussed importance of daily weight and  advised patient to weigh and record daily Reviewed role of diuretics in prevention of fluid overload and management of heart failure; Discussed the importance of keeping all appointments with provider Reviewed to call paramedic to schedule home visit. Reinforced to send in Cardiomem daily. Reviewed importance of keeping appointment with HF clinic on 01/31/22.  Reinforced s/sx of HF to call provider.    Diabetes Interventions:  (Status:  Goal on track:  NO.) Long Term Goal Assessed patient's understanding of A1c goal: <7% Provided education to patient about basic DM disease process Reviewed medications with patient and discussed importance of medication adherence Counseled on importance of regular laboratory monitoring as prescribed Provided patient with written educational materials related to hypo and hyperglycemia and importance of correct treatment Advised patient, providing education and rationale, to check cbg daily and record, calling provider for findings outside established parameters Referral made to community resources care guide team for assistance with pt has numbers to call to get window repaired. Reinforced to call numbers to see about getting repairs made  Reviewed to take his insulin daily as ordered. Reinforced to change his insulin needle with each inject and rotate his injections sites to help decrease pain from injection.  Reinforced to try to check his blood sugars and to avoid fast food.  Reinforced again to make follow up appointment with provider to check his diabetes Lab Results  Component Value Date   HGBA1C 8.7 (A) 05/05/2021   Hyperlipidemia Interventions:  (Status:  Goal on track:  Yes.) Long Term Goal Medication review performed; medication list updated in electronic medical record.  Provider established cholesterol goals reviewed Counseled on importance of regular laboratory monitoring as prescribed Reviewed role and benefits of statin for ASCVD risk  reduction Reviewed importance of limiting foods high in cholesterol  Hypertension Interventions:  (Status:  Goal on track:  Yes.) Long Term Goal Last practice recorded BP readings:  BP Readings from Last 3 Encounters:  01/05/22 (!) 144/80  12/11/21 (!) 144/78  10/11/21 (!) 110/50  Most recent eGFR/CrCl: No results found for: EGFR  No components found for: CRCL  Evaluation of current treatment plan related to hypertension self management and patient's adherence to plan as established by provider Provided education to patient re: stroke prevention, s/s of heart attack and stroke Advised patient, providing education and rationale, to monitor blood pressure daily and record, calling PCP for findings outside established parameters Discussed  complications of poorly controlled blood pressure such as heart disease, stroke, circulatory complications, vision complications, kidney impairment, sexual dysfunction Reviewed to check his B/P daily and keep log to take to MD appointments  Patient Goals/Self-Care Activities: Take all medications as prescribed Attend all scheduled provider appointments Call pharmacy for medication refills 3-7 days in advance of running out of medications Call provider office for new concerns or questions  call office if I gain more than 2 pounds in one day or 5 pounds in one week keep legs up while sitting watch for swelling in feet, ankles and legs every day weigh myself daily track symptoms and what helps feel better or worse Send Cardiomem reading daily schedule appointment with eye doctor check blood sugar at prescribed times: once daily and when you have symptoms of low or high blood sugar check feet daily for cuts, sores or redness take the blood sugar log to all doctor visits fill half of plate with vegetables limit fast food meals to no more than 1 per week manage portion size switch to sugar-free drinks wash and dry feet carefully every day check pulse  (heart) rate once a day make a plan to eat healthy take medicine as prescribed check blood pressure daily choose a place to take my blood pressure (home, clinic or office, retail store) keep a blood pressure log take blood pressure log to all doctor appointments take medications for blood pressure exactly as prescribed eat more whole grains, fruits and vegetables, lean meats and healthy fats limit salt intake to 207m/day call for medicine refill 2 or 3 days before it runs out take all medications exactly as prescribed call doctor with any symptoms you believe are related to your medicine  Follow Up Plan:  Telephone follow up appointment with care management team member scheduled for:  02/16/22 The patient has been provided with contact information for the care management team and has been advised to call with any health related questions or concerns.       Plan:Telephone follow up appointment with care management team member scheduled for:  02/16/22 The patient has been provided with contact information for the care management team and has been advised to call with any health related questions or concerns.  MPeter GarterRN, BJackquline Denmark CDE Care Management Coordinator [facility] [phone]

## 2022-01-13 ENCOUNTER — Other Ambulatory Visit: Payer: Self-pay

## 2022-01-13 ENCOUNTER — Encounter (HOSPITAL_BASED_OUTPATIENT_CLINIC_OR_DEPARTMENT_OTHER): Payer: Self-pay | Admitting: Emergency Medicine

## 2022-01-13 DIAGNOSIS — I2510 Atherosclerotic heart disease of native coronary artery without angina pectoris: Secondary | ICD-10-CM | POA: Insufficient documentation

## 2022-01-13 DIAGNOSIS — R059 Cough, unspecified: Secondary | ICD-10-CM | POA: Diagnosis present

## 2022-01-13 DIAGNOSIS — I509 Heart failure, unspecified: Secondary | ICD-10-CM | POA: Insufficient documentation

## 2022-01-13 DIAGNOSIS — Z7901 Long term (current) use of anticoagulants: Secondary | ICD-10-CM | POA: Diagnosis not present

## 2022-01-13 DIAGNOSIS — E119 Type 2 diabetes mellitus without complications: Secondary | ICD-10-CM | POA: Diagnosis not present

## 2022-01-13 DIAGNOSIS — Z7984 Long term (current) use of oral hypoglycemic drugs: Secondary | ICD-10-CM | POA: Insufficient documentation

## 2022-01-13 DIAGNOSIS — I4891 Unspecified atrial fibrillation: Secondary | ICD-10-CM | POA: Insufficient documentation

## 2022-01-13 DIAGNOSIS — U071 COVID-19: Secondary | ICD-10-CM | POA: Insufficient documentation

## 2022-01-13 DIAGNOSIS — E1165 Type 2 diabetes mellitus with hyperglycemia: Secondary | ICD-10-CM | POA: Diagnosis not present

## 2022-01-13 LAB — CBC WITH DIFFERENTIAL/PLATELET
Abs Immature Granulocytes: 0.06 10*3/uL (ref 0.00–0.07)
Basophils Absolute: 0.1 10*3/uL (ref 0.0–0.1)
Basophils Relative: 1 %
Eosinophils Absolute: 0 10*3/uL (ref 0.0–0.5)
Eosinophils Relative: 0 %
HCT: 51.8 % (ref 39.0–52.0)
Hemoglobin: 17.3 g/dL — ABNORMAL HIGH (ref 13.0–17.0)
Immature Granulocytes: 1 %
Lymphocytes Relative: 26 %
Lymphs Abs: 1.8 10*3/uL (ref 0.7–4.0)
MCH: 28.1 pg (ref 26.0–34.0)
MCHC: 33.4 g/dL (ref 30.0–36.0)
MCV: 84.2 fL (ref 80.0–100.0)
Monocytes Absolute: 1.7 10*3/uL — ABNORMAL HIGH (ref 0.1–1.0)
Monocytes Relative: 24 %
Neutro Abs: 3.3 10*3/uL (ref 1.7–7.7)
Neutrophils Relative %: 48 %
Platelets: 117 10*3/uL — ABNORMAL LOW (ref 150–400)
RBC: 6.15 MIL/uL — ABNORMAL HIGH (ref 4.22–5.81)
RDW: 14.5 % (ref 11.5–15.5)
WBC: 6.9 10*3/uL (ref 4.0–10.5)
nRBC: 0 % (ref 0.0–0.2)

## 2022-01-13 LAB — SARS CORONAVIRUS 2 BY RT PCR: SARS Coronavirus 2 by RT PCR: POSITIVE — AB

## 2022-01-13 LAB — COMPREHENSIVE METABOLIC PANEL
ALT: 22 U/L (ref 0–44)
AST: 25 U/L (ref 15–41)
Albumin: 3.6 g/dL (ref 3.5–5.0)
Alkaline Phosphatase: 69 U/L (ref 38–126)
Anion gap: 11 (ref 5–15)
BUN: 26 mg/dL — ABNORMAL HIGH (ref 8–23)
CO2: 26 mmol/L (ref 22–32)
Calcium: 8.8 mg/dL — ABNORMAL LOW (ref 8.9–10.3)
Chloride: 96 mmol/L — ABNORMAL LOW (ref 98–111)
Creatinine, Ser: 1.83 mg/dL — ABNORMAL HIGH (ref 0.61–1.24)
GFR, Estimated: 40 mL/min — ABNORMAL LOW (ref >60)
Glucose, Bld: 454 mg/dL — ABNORMAL HIGH (ref 70–99)
Potassium: 4 mmol/L (ref 3.5–5.1)
Sodium: 133 mmol/L — ABNORMAL LOW (ref 135–145)
Total Bilirubin: 0.8 mg/dL (ref 0.3–1.2)
Total Protein: 7.8 g/dL (ref 6.5–8.1)

## 2022-01-13 NOTE — ED Triage Notes (Signed)
Pt reports cough, sore throat, fatigue and diarrhea x 2-3 days; home test + Covid

## 2022-01-14 ENCOUNTER — Emergency Department (HOSPITAL_BASED_OUTPATIENT_CLINIC_OR_DEPARTMENT_OTHER)
Admission: EM | Admit: 2022-01-14 | Discharge: 2022-01-14 | Disposition: A | Discharge status: Home / Self Care | Payer: Medicare Other | Attending: Emergency Medicine | Admitting: Emergency Medicine

## 2022-01-14 DIAGNOSIS — R739 Hyperglycemia, unspecified: Secondary | ICD-10-CM

## 2022-01-14 DIAGNOSIS — U071 COVID-19: Secondary | ICD-10-CM

## 2022-01-14 LAB — CBG MONITORING, ED: Glucose-Capillary: 395 mg/dL — ABNORMAL HIGH (ref 70–99)

## 2022-01-14 MED ORDER — MOLNUPIRAVIR EUA 200MG CAPSULE: 4.0000 | ORAL_CAPSULE | Freq: Two times a day (BID) | ORAL | 0 refills | Status: AC

## 2022-01-14 NOTE — ED Provider Notes (Signed)
[facility] Provider Note   CSN: [id number] Arrival date & time: 01/13/22  2049     History  Chief Complaint  Patient presents with   Cough    [name] [name] is a 66 y.o. male.  Patient is a 66 year old male with past medical history of type 2 diabetes, atrial fibrillation, coronary artery disease, congestive heart failure, chronic renal insufficiency.  Patient presenting today with complaints of sore throat, cough, loose stools, body aches, and feeling generally unwell for the past several days.  He took a home COVID test that was positive.  He denies difficulty breathing or chest pain.  He states several members of his immediate family are ill with similar symptoms.  The history is provided by the patient.      Home Medications Prior to Admission medications   Medication Sig Start Date End Date Taking? Authorizing Provider  ACCU-CHEK SOFTCLIX LANCETS lancets Use as instructed for three times daily testing of blood glucose 09/06/16   Jegede, Olugbemiga E, MD  acetaminophen (TYLENOL) 500 MG tablet Take 1,000 mg by mouth every 6 (six) hours as needed (pain).    [provider]  albuterol (VENTOLIN HFA) 108 (90 Base) MCG/ACT inhaler Inhale 1-2 puffs into the lungs every 6 (six) hours as needed for wheezing or shortness of breath. 04/06/20   Tasia Catchings, Amy V, PA-C  amLODipine (NORVASC) 10 MG tablet Take 1 tablet (10 mg total) by mouth daily. 06/21/21   Milford, Maricela Bo, FNP  apixaban (ELIQUIS) 5 MG TABS tablet Take 1 tablet (5 mg total) by mouth 2 (two) times daily. 05/24/21   Rafael Bihari, FNP  atorvastatin (LIPITOR) 80 MG tablet Take 1 tablet by mouth once daily 12/13/21   Rafael Bihari, FNP  blood glucose meter kit and supplies KIT Dispense based on patient and insurance preference. Use up to four times daily as directed. (FOR ICD-9 250.00, 250.01). 07/20/20   [name], MD  Blood Glucose Monitoring Suppl (ACCU-CHEK AVIVA PLUS)  w/Device KIT Use as directed for 3 times daily testing of blood glucose 09/06/16   Jegede, Olugbemiga E, MD  carvedilol (COREG) 25 MG tablet TAKE 1 TABLET BY MOUTH TWICE DAILY WITH A MEAL 06/26/21   Larey Dresser, MD  colchicine 0.6 MG tablet On day 1 take 2 tablets at first sign of gout flare.  Take 1 tablet 1 hour later.  Then take 1 tablet daily starting the next day until resolution of gout flare. Patient taking differently: On day 1 take 2 tablets at first sign of gout flare.  Take 1 tablet 1 hour later.  Then take 1 tablet daily starting the next day until resolution of gout flare as needed. 05/24/21   Milford, Maricela Bo, FNP  empagliflozin (JARDIANCE) 10 MG TABS tablet Take 1 tablet (10 mg total) by mouth daily before breakfast. 03/21/21   Milford, Maricela Bo, FNP  eplerenone (INSPRA) 25 MG tablet Take 1 tablet (25 mg total) by mouth daily. Patient not taking: Reported on 01/05/2022 05/24/21   Rafael Bihari, FNP  glucose blood (ACCU-CHEK AVIVA PLUS) test strip Use as instructed for 3 times daily testing of blood glucose 09/06/16   Angelica Chessman E, MD  guaiFENesin (MUCINEX PO) Take 1 tablet by mouth 2 (two) times daily as needed (cough/congestion).     [provider]  hydrALAZINE (APRESOLINE) 50 MG tablet TAKE 1 & 1/2 (ONE & ONE-HALF) TABLETS BY MOUTH TWICE DAILY. MUST MAKE FOLLOW UP APPT FOR  FURTHER REFILLS 02/01/21   Larey Dresser, MD  insulin glargine (LANTUS SOLOSTAR) 100 UNIT/ML Solostar Pen Inject 40 Units into the skin every morning. And pen needles 1/day 02/27/21   Renato Shin, MD  Insulin Pen Needle (PEN NEEDLES) 32G X 4 MM MISC 1 each by Does not apply route daily. 05/07/21   Renato Shin, MD  isosorbide mononitrate (IMDUR) 120 MG 24 hr tablet Take 1 tablet (120 mg total) by mouth daily. Patient not taking: Reported on 01/05/2022 05/24/21   Rafael Bihari, FNP  Lancet Devices [facility]) lancets Use as instructed for 3 times daily testing of blood glucose  09/06/16   Tresa Garter, MD  meclizine (ANTIVERT) 12.5 MG tablet Take 1 tablet (12.5 mg total) by mouth 3 (three) times daily as needed for dizziness. 04/30/21   Quintella Reichert, MD  metFORMIN (GLUCOPHAGE) 1000 MG tablet Take 1,000 mg by mouth daily with breakfast.    [provider]  nitroGLYCERIN (NITROSTAT) 0.4 MG SL tablet Place 1 tablet (0.4 mg total) under the tongue every 5 (five) minutes as needed for chest pain (x 3 tabs daily). 09/07/20   Milford, Maricela Bo, FNP  sotalol (BETAPACE) 120 MG tablet Take 1 tablet (120 mg total) by mouth 2 (two) times daily. 03/21/21   Rafael Bihari, FNP  torsemide (DEMADEX) 20 MG tablet Take 80 mg by mouth 2 (two) times daily. Patient not taking: Reported on 01/05/2022    [provider]  TRUEPLUS LANCETS 28G MISC 1 each by Does not apply route 3 (three) times daily. 08/22/16   Tresa Garter, MD      Allergies    Shrimp [shellfish allergy], Ace inhibitors, and Other    Review of Systems   Review of Systems  All other systems reviewed and are negative.  Physical Exam Updated Vital Signs BP 120/79   Pulse 70   Temp 99.2 F (37.3 C) (Oral)   Resp 18   Ht 6' (1.829 m)   Wt 93 kg   SpO2 95%   BMI 27.80 kg/m  Physical Exam Vitals and nursing note reviewed.  Constitutional:      General: He is not in acute distress.    Appearance: He is well-developed. He is not diaphoretic.  HENT:     Head: Normocephalic and atraumatic.  Cardiovascular:     Rate and Rhythm: Normal rate and regular rhythm.     Heart sounds: No murmur heard.   No friction rub.  Pulmonary:     Effort: Pulmonary effort is normal. No respiratory distress.     Breath sounds: Normal breath sounds. No wheezing or rales.  Abdominal:     General: Bowel sounds are normal. There is no distension.     Palpations: Abdomen is soft.     Tenderness: There is no abdominal tenderness.  Musculoskeletal:        General: Normal range of motion.     Cervical  back: Normal range of motion and neck supple.  Skin:    General: Skin is warm and dry.  Neurological:     Mental Status: He is alert and oriented to person, place, and time.     Coordination: Coordination normal.    ED Results / Procedures / Treatments   Labs (all labs ordered are listed, but only abnormal results are displayed) Labs Reviewed  SARS CORONAVIRUS 2 BY RT PCR - Abnormal; Notable for the following components:      Result Value   SARS Coronavirus  2 by RT PCR POSITIVE (*)    All other components within normal limits  COMPREHENSIVE METABOLIC PANEL - Abnormal; Notable for the following components:   Sodium 133 (*)    Chloride 96 (*)    Glucose, Bld 454 (*)    BUN 26 (*)    Creatinine, Ser 1.83 (*)    Calcium 8.8 (*)    GFR, Estimated 40 (*)    All other components within normal limits  CBC WITH DIFFERENTIAL/PLATELET - Abnormal; Notable for the following components:   RBC 6.15 (*)    Hemoglobin 17.3 (*)    Platelets 117 (*)    Monocytes Absolute 1.7 (*)    All other components within normal limits  CBG MONITORING, ED    EKG None  Radiology No results found.  Procedures Procedures    Medications Ordered in ED Medications - No data to display  ED Course/ Medical Decision Making/ A&P  Patient presenting with URI-like symptoms and positive COVID test.  He has extensive past medical history as outlined in the HPI, but laboratory studies are consistent with baseline.  His blood sugar is elevated at 450, but repeated and is now 390.  Patient tells me that this is his baseline as he is only intermittently compliant with his insulin.  Remainder of work-up unremarkable.  I feel as though patient can be discharged safely.  He has no hypoxia and is is in no distress.  I will prescribe molnupiravir for COVID.  Patient to return as needed.  Final Clinical Impression(s) / ED Diagnoses Final diagnoses:  None    Rx / DC Orders ED Discharge Orders     None          Veryl Speak, MD 01/14/22 [phone]

## 2022-01-14 NOTE — Discharge Instructions (Signed)
Begin taking molnupiravir as prescribed.  This is an antiviral medication that should shorten the course of your illness and reduce the severity of symptoms.  Isolate at home for 5 days.  Drink plenty of fluids and get plenty of rest.  Return to the emergency department if you develop difficulty breathing, chest pains, or other new and concerning symptoms.

## 2022-01-15 ENCOUNTER — Telehealth (HOSPITAL_COMMUNITY): Payer: Self-pay

## 2022-01-15 ENCOUNTER — Other Ambulatory Visit: Payer: Self-pay

## 2022-01-15 MED ORDER — METFORMIN HCL 1000 MG PO TABS: ORAL_TABLET | ORAL | 0 refills | Status: DC

## 2022-01-15 NOTE — Telephone Encounter (Signed)
Spoke to [place] today who reports he has COVID and wishes to plan a home visit for when he is feeling better. I advised I am happy to come out despite his illness and he continued to request to reschedule. I plan to reach out next week to schedule and he was agreeable. Call complete.

## 2022-01-21 ENCOUNTER — Encounter (HOSPITAL_COMMUNITY): Payer: Self-pay

## 2022-01-21 ENCOUNTER — Emergency Department (HOSPITAL_COMMUNITY): Payer: Medicare Other

## 2022-01-21 ENCOUNTER — Other Ambulatory Visit: Payer: Self-pay

## 2022-01-21 ENCOUNTER — Observation Stay (HOSPITAL_COMMUNITY)
Admission: EM | Admit: 2022-01-21 | Discharge: 2022-01-23 | Disposition: A | Discharge status: Home / Self Care | Payer: Medicare Other | Attending: Family Medicine | Admitting: Family Medicine

## 2022-01-21 DIAGNOSIS — I441 Atrioventricular block, second degree: Secondary | ICD-10-CM

## 2022-01-21 DIAGNOSIS — Z7984 Long term (current) use of oral hypoglycemic drugs: Secondary | ICD-10-CM | POA: Diagnosis not present

## 2022-01-21 DIAGNOSIS — I501 Left ventricular failure: Secondary | ICD-10-CM

## 2022-01-21 DIAGNOSIS — I200 Unstable angina: Secondary | ICD-10-CM | POA: Diagnosis not present

## 2022-01-21 DIAGNOSIS — N183 Chronic kidney disease, stage 3 unspecified: Secondary | ICD-10-CM | POA: Insufficient documentation

## 2022-01-21 DIAGNOSIS — J811 Chronic pulmonary edema: Secondary | ICD-10-CM | POA: Diagnosis not present

## 2022-01-21 DIAGNOSIS — Z794 Long term (current) use of insulin: Secondary | ICD-10-CM | POA: Diagnosis not present

## 2022-01-21 DIAGNOSIS — R911 Solitary pulmonary nodule: Secondary | ICD-10-CM | POA: Diagnosis not present

## 2022-01-21 DIAGNOSIS — R072 Precordial pain: Secondary | ICD-10-CM | POA: Diagnosis not present

## 2022-01-21 DIAGNOSIS — Z7901 Long term (current) use of anticoagulants: Secondary | ICD-10-CM | POA: Insufficient documentation

## 2022-01-21 DIAGNOSIS — E1122 Type 2 diabetes mellitus with diabetic chronic kidney disease: Secondary | ICD-10-CM | POA: Diagnosis not present

## 2022-01-21 DIAGNOSIS — I4819 Other persistent atrial fibrillation: Secondary | ICD-10-CM | POA: Diagnosis not present

## 2022-01-21 DIAGNOSIS — R079 Chest pain, unspecified: Secondary | ICD-10-CM | POA: Diagnosis not present

## 2022-01-21 DIAGNOSIS — Z87891 Personal history of nicotine dependence: Secondary | ICD-10-CM | POA: Diagnosis not present

## 2022-01-21 DIAGNOSIS — Z79899 Other long term (current) drug therapy: Secondary | ICD-10-CM | POA: Insufficient documentation

## 2022-01-21 DIAGNOSIS — I5042 Chronic combined systolic (congestive) and diastolic (congestive) heart failure: Secondary | ICD-10-CM | POA: Diagnosis not present

## 2022-01-21 DIAGNOSIS — I130 Hypertensive heart and chronic kidney disease with heart failure and stage 1 through stage 4 chronic kidney disease, or unspecified chronic kidney disease: Secondary | ICD-10-CM | POA: Insufficient documentation

## 2022-01-21 DIAGNOSIS — Z8616 Personal history of COVID-19: Secondary | ICD-10-CM | POA: Insufficient documentation

## 2022-01-21 DIAGNOSIS — I25110 Atherosclerotic heart disease of native coronary artery with unstable angina pectoris: Secondary | ICD-10-CM | POA: Diagnosis not present

## 2022-01-21 DIAGNOSIS — Z955 Presence of coronary angioplasty implant and graft: Secondary | ICD-10-CM | POA: Diagnosis not present

## 2022-01-21 DIAGNOSIS — I10 Essential (primary) hypertension: Secondary | ICD-10-CM | POA: Diagnosis present

## 2022-01-21 DIAGNOSIS — E7800 Pure hypercholesterolemia, unspecified: Secondary | ICD-10-CM | POA: Diagnosis present

## 2022-01-21 DIAGNOSIS — J810 Acute pulmonary edema: Secondary | ICD-10-CM | POA: Diagnosis not present

## 2022-01-21 DIAGNOSIS — G4733 Obstructive sleep apnea (adult) (pediatric): Secondary | ICD-10-CM | POA: Diagnosis present

## 2022-01-21 DIAGNOSIS — K449 Diaphragmatic hernia without obstruction or gangrene: Secondary | ICD-10-CM | POA: Diagnosis not present

## 2022-01-21 DIAGNOSIS — E119 Type 2 diabetes mellitus without complications: Secondary | ICD-10-CM

## 2022-01-21 LAB — BASIC METABOLIC PANEL
Anion gap: 9 (ref 5–15)
BUN: 10 mg/dL (ref 8–23)
CO2: 20 mmol/L — ABNORMAL LOW (ref 22–32)
Calcium: 8.9 mg/dL (ref 8.9–10.3)
Chloride: 109 mmol/L (ref 98–111)
Creatinine, Ser: 1.24 mg/dL (ref 0.61–1.24)
GFR, Estimated: >60 mL/min (ref >60)
Glucose, Bld: 323 mg/dL — ABNORMAL HIGH (ref 70–99)
Potassium: 4.1 mmol/L (ref 3.5–5.1)
Sodium: 138 mmol/L (ref 135–145)

## 2022-01-21 LAB — CBC
HCT: 49.3 % (ref 39.0–52.0)
Hemoglobin: 15.8 g/dL (ref 13.0–17.0)
MCH: 28 pg (ref 26.0–34.0)
MCHC: 32 g/dL (ref 30.0–36.0)
MCV: 87.4 fL (ref 80.0–100.0)
Platelets: 185 10*3/uL (ref 150–400)
RBC: 5.64 MIL/uL (ref 4.22–5.81)
RDW: 14.1 % (ref 11.5–15.5)
WBC: 5.3 10*3/uL (ref 4.0–10.5)
nRBC: 0 % (ref 0.0–0.2)

## 2022-01-21 LAB — TROPONIN I (HIGH SENSITIVITY)
Troponin I (High Sensitivity): 18 ng/L — ABNORMAL HIGH (ref <18)
Troponin I (High Sensitivity): 16 ng/L

## 2022-01-21 NOTE — ED Provider Triage Note (Signed)
Emergency Medicine Provider Triage Evaluation Note  Jeriah Corkum , a 66 y.o. male  was evaluated in triage.  Patient with CHF, DM presents to the ED with a chief complaint of chest pain that is been ongoing for the past 3 days.  He describes as "like an elephant has his foot on my chest ".  Symptoms are exacerbated with lying down flat.  The episodes happen intermittently, these are also accompanied by shortness of breath.  No prior history of CAD.  Does endorse fhx of CAD. No tobacco use.   Review of Systems  Positive: Chest pain, sob Negative: Fever, palpitations, leg swelling  Physical Exam  BP (!) 182/148 (BP Location: Right Arm)   Pulse 74   Temp 98.7 F (37.1 C) (Oral)   Resp 17   Ht 6' (1.829 m)   Wt 93.9 kg   SpO2 94%   BMI 28.07 kg/m  Gen:   Awake, no distress   Resp:  Normal effort  MSK:   Moves extremities without difficulty  Other:  's are clear to auscultation, pain is not reproducible with palpation on the substernal area.  No leg swelling noted.  Medical Decision Making  Medically screening exam initiated at 5:31 PM.  Appropriate orders placed.  Loura Back was informed that the remainder of the evaluation will be completed by another provider, this initial triage assessment does not replace that evaluation, and the importance of remaining in the ED until their evaluation is complete.     Janeece Fitting, PA-C 01/21/22 1735

## 2022-01-21 NOTE — ED Triage Notes (Signed)
Complains of cp that started 2 days ago with no pain radiating anywhere.  Also complains of mild sob.

## 2022-01-22 ENCOUNTER — Encounter (HOSPITAL_COMMUNITY): Payer: Self-pay | Admitting: Emergency Medicine

## 2022-01-22 ENCOUNTER — Emergency Department (HOSPITAL_COMMUNITY): Payer: Medicare Other

## 2022-01-22 DIAGNOSIS — R079 Chest pain, unspecified: Secondary | ICD-10-CM | POA: Diagnosis present

## 2022-01-22 DIAGNOSIS — I4892 Unspecified atrial flutter: Secondary | ICD-10-CM

## 2022-01-22 DIAGNOSIS — I5032 Chronic diastolic (congestive) heart failure: Secondary | ICD-10-CM

## 2022-01-22 DIAGNOSIS — K449 Diaphragmatic hernia without obstruction or gangrene: Secondary | ICD-10-CM | POA: Diagnosis not present

## 2022-01-22 DIAGNOSIS — R911 Solitary pulmonary nodule: Secondary | ICD-10-CM | POA: Diagnosis not present

## 2022-01-22 LAB — HEMOGLOBIN A1C
Hgb A1c MFr Bld: 15 % — ABNORMAL HIGH (ref 4.8–5.6)
Mean Plasma Glucose: 383.8 mg/dL

## 2022-01-22 LAB — GLUCOSE, CAPILLARY: Glucose-Capillary: 168 mg/dL — ABNORMAL HIGH (ref 70–99)

## 2022-01-22 LAB — CBG MONITORING, ED
Glucose-Capillary: 191 mg/dL — ABNORMAL HIGH (ref 70–99)
Glucose-Capillary: 237 mg/dL — ABNORMAL HIGH (ref 70–99)

## 2022-01-22 LAB — BRAIN NATRIURETIC PEPTIDE: B Natriuretic Peptide: 118.4 pg/mL — ABNORMAL HIGH (ref 0.0–100.0)

## 2022-01-22 MED ORDER — APIXABAN 5 MG PO TABS
5.0000 mg | ORAL_TABLET | Freq: Two times a day (BID) | ORAL | Status: DC
  Administered 2022-01-22 – 2022-01-23 (×3): 5 mg via ORAL
  Filled 2022-01-22 (×3): qty 1

## 2022-01-22 MED ORDER — HYDRALAZINE HCL 50 MG PO TABS
75.0000 mg | ORAL_TABLET | Freq: Two times a day (BID) | ORAL | Status: DC
  Administered 2022-01-22 – 2022-01-23 (×3): 75 mg via ORAL
  Filled 2022-01-22 (×2): qty 1
  Filled 2022-01-22: qty 3

## 2022-01-22 MED ORDER — INSULIN ASPART 100 UNIT/ML IJ SOLN: 0.0000 [IU] | Freq: Every day | INTRAMUSCULAR | Status: DC

## 2022-01-22 MED ORDER — NITROGLYCERIN 0.4 MG SL SUBL
0.4000 mg | SUBLINGUAL_TABLET | SUBLINGUAL | Status: DC | PRN
  Administered 2022-01-22: 0.4 mg via SUBLINGUAL
  Filled 2022-01-22: qty 1

## 2022-01-22 MED ORDER — AMLODIPINE BESYLATE 5 MG PO TABS
5.0000 mg | ORAL_TABLET | Freq: Every day | ORAL | Status: DC
  Administered 2022-01-22 – 2022-01-23 (×2): 5 mg via ORAL
  Filled 2022-01-22 (×2): qty 1

## 2022-01-22 MED ORDER — ISOSORBIDE MONONITRATE ER 60 MG PO TB24
120.0000 mg | ORAL_TABLET | Freq: Every day | ORAL | Status: DC
  Administered 2022-01-22 – 2022-01-23 (×2): 120 mg via ORAL
  Filled 2022-01-22: qty 2
  Filled 2022-01-22: qty 4

## 2022-01-22 MED ORDER — SOTALOL HCL 120 MG PO TABS
120.0000 mg | ORAL_TABLET | Freq: Two times a day (BID) | ORAL | Status: DC
  Administered 2022-01-22 – 2022-01-23 (×2): 120 mg via ORAL
  Filled 2022-01-22 (×3): qty 1

## 2022-01-22 MED ORDER — ONDANSETRON HCL 4 MG/2ML IJ SOLN: 4.0000 mg | Freq: Four times a day (QID) | INTRAMUSCULAR | Status: DC | PRN

## 2022-01-22 MED ORDER — ALBUTEROL SULFATE HFA 108 (90 BASE) MCG/ACT IN AERS: 1.0000 | INHALATION_SPRAY | Freq: Four times a day (QID) | RESPIRATORY_TRACT | Status: DC | PRN

## 2022-01-22 MED ORDER — ACETAMINOPHEN 325 MG PO TABS: 650.0000 mg | ORAL_TABLET | ORAL | Status: DC | PRN

## 2022-01-22 MED ORDER — EPLERENONE 25 MG PO TABS
25.0000 mg | ORAL_TABLET | Freq: Every day | ORAL | Status: DC
  Administered 2022-01-22 – 2022-01-23 (×2): 25 mg via ORAL
  Filled 2022-01-22 (×2): qty 1

## 2022-01-22 MED ORDER — SODIUM CHLORIDE 0.9 % IV BOLUS
500.0000 mL | Freq: Once | INTRAVENOUS | Status: AC
  Administered 2022-01-22: 500 mL via INTRAVENOUS

## 2022-01-22 MED ORDER — ATORVASTATIN CALCIUM 80 MG PO TABS
80.0000 mg | ORAL_TABLET | Freq: Every day | ORAL | Status: DC
  Administered 2022-01-22 – 2022-01-23 (×2): 80 mg via ORAL
  Filled 2022-01-22: qty 1
  Filled 2022-01-22: qty 2

## 2022-01-22 MED ORDER — CARVEDILOL 25 MG PO TABS
25.0000 mg | ORAL_TABLET | Freq: Two times a day (BID) | ORAL | Status: DC
  Administered 2022-01-22 – 2022-01-23 (×2): 25 mg via ORAL
  Filled 2022-01-22: qty 1
  Filled 2022-01-22: qty 8

## 2022-01-22 MED ORDER — INSULIN ASPART 100 UNIT/ML IJ SOLN
4.0000 [IU] | Freq: Three times a day (TID) | INTRAMUSCULAR | Status: DC
  Administered 2022-01-22 – 2022-01-23 (×4): 4 [IU] via SUBCUTANEOUS

## 2022-01-22 MED ORDER — IOHEXOL 350 MG/ML SOLN
100.0000 mL | Freq: Once | INTRAVENOUS | Status: AC | PRN
  Administered 2022-01-22: 100 mL via INTRAVENOUS

## 2022-01-22 MED ORDER — ASPIRIN 81 MG PO CHEW
324.0000 mg | CHEWABLE_TABLET | Freq: Once | ORAL | Status: AC
  Administered 2022-01-22: 324 mg via ORAL
  Filled 2022-01-22: qty 4

## 2022-01-22 MED ORDER — INSULIN ASPART 100 UNIT/ML IJ SOLN
0.0000 [IU] | Freq: Three times a day (TID) | INTRAMUSCULAR | Status: DC
  Administered 2022-01-22: 3 [IU] via SUBCUTANEOUS
  Administered 2022-01-23: 8 [IU] via SUBCUTANEOUS
  Administered 2022-01-23: 5 [IU] via SUBCUTANEOUS

## 2022-01-22 MED ORDER — INSULIN GLARGINE-YFGN 100 UNIT/ML ~~LOC~~ SOLN
40.0000 [IU] | Freq: Every day | SUBCUTANEOUS | Status: DC
  Administered 2022-01-22 – 2022-01-23 (×2): 40 [IU] via SUBCUTANEOUS
  Filled 2022-01-22 (×2): qty 0.4

## 2022-01-22 MED ORDER — TORSEMIDE 20 MG PO TABS
80.0000 mg | ORAL_TABLET | Freq: Two times a day (BID) | ORAL | Status: DC
  Administered 2022-01-22 – 2022-01-23 (×2): 80 mg via ORAL
  Filled 2022-01-22 (×2): qty 4

## 2022-01-22 NOTE — Progress Notes (Signed)
Pt set on on auto titrate BIPAP.  Tolerating well at this time.

## 2022-01-22 NOTE — ED Notes (Addendum)
Pt refused blood draw per phlebotomy

## 2022-01-22 NOTE — ED Notes (Signed)
Pt reports no relief with 1st nitro and did not want to try the 2nd one

## 2022-01-22 NOTE — ED Provider Notes (Addendum)
[facility] EMERGENCY DEPARTMENT Provider Note   CSN: [id number] Arrival date & time: 01/21/22  1706     History  Chief Complaint  Patient presents with   Chest Pain    [name] [name] is a 66 y.o. male.  Patient as above with significant medical history as below, including HFpEF, CAD, atrial flutter, exertional dyspnea, sleep apnea, type 2 diabetes, variable medication and CPAP compliance who presents to the ED with complaint of chest pain, dyspnea x2 days.  Patient is also recent diagnosed COVID-19, reports his COVID-19 symptoms have improved, he was started on Paxlovid but did not complete.  Patient reports pressure, heaviness midsternal, some pain to his left arm.  Intermittent dyspnea.  Unable to identify alleviating or exacerbating factors.  No fevers or chills.  Intermittent cough residual from his recent COVID-19 illness.  No nausea or vomiting, no near syncope or lightheadedness, no recent medication adjustment.  Compliant with her medications.  No change in bowel or bladder function.  No recent travel or sick contacts.  No history of DVT or PE.  Follows with cardiology Dr Aundra Dubin, last office visit 5/26   Past Medical History:  Diagnosis Date   Anxiety    CAD (coronary artery disease), native coronary artery    a. Nonobstructive CAD by cath 2013 - diffuse distal and branch vessel CAD, no severe disease in the major coronaries, LV mild global hypokinesis, EF 45%. b. ETT-Sestamibi 5/14: EF 31%, small fixed inferior defect with no ischemia.   Chronic CHF ([place])    a. Mixed ICM/NICM (?EtOH). EF 35% in 2008. Echo 5/13: EF 60-65%, mod LVH, EF 45% on V gram in 12/2011. EF 12/2012: EF 50-55%, mild LVH, inferobasal HK, mild MR. ETT-Ses 5/14 EF 41%. Cardiac MRI 5/14: EF 44%, mild global HK, subepicardial delayed enhancement in nonspecific RV insertion pattern.   COLONIC POLYPS, HX OF 12/30/2006   Gout    H/O atrial flutter 08/13/2005   a. Ablations in 2007, 2008.    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   "left shoulder"   PAF (paroxysmal atrial fibrillation) ([place])    On coumadin   Sleep apnea    "suppose to send mask but they never did" (05/03/2015)   Type II diabetes mellitus ([place])     Past Surgical History:  Procedure Laterality Date   A FLUTTER ABLATION  2007, 2008   catheter ablation    CARDIAC CATHETERIZATION N/A 05/03/2015   Procedure: Right/Left Heart Cath and Coronary Angiography;  Surgeon: Larey Dresser, MD;  Location: [place] [facility];  Service: Cardiovascular;  Laterality: N/A;   CARDIAC CATHETERIZATION N/A 05/03/2015   Procedure: Coronary Stent Intervention;  Surgeon: Jettie Booze, MD;  Location: [place] [facility];  Service: Cardiovascular;  Laterality: N/A;   CARDIAC CATHETERIZATION N/A 11/04/2015   Procedure: Left Heart Cath and Coronary Angiography;  Surgeon: Larey Dresser, MD;  Location: [place] [facility];  Service: Cardiovascular;  Laterality: N/A;   CARDIOVERSION N/A 08/24/2015   Procedure: CARDIOVERSION;  Surgeon: Thayer Headings, MD;  Location: [place];  Service: Cardiovascular;  Laterality: N/A;   CHOLECYSTECTOMY N/A 02/07/2016  Procedure: LAPAROSCOPIC CHOLECYSTECTOMY WITH  INTRAOPERATIVE CHOLANGIOGRAM;  Surgeon: Greer Pickerel, MD;  Location: [facility];  Service: General;  Laterality: N/A;   COLONOSCOPY WITH PROPOFOL N/A 04/28/2019   Procedure: COLONOSCOPY WITH PROPOFOL;  Surgeon: Ronnette Juniper, MD;  Location: [place];  Service: Gastroenterology;  Laterality: N/A;   CORONARY ANGIOPLASTY  12/05/01   ESOPHAGOGASTRODUODENOSCOPY (EGD) WITH PROPOFOL N/A 04/27/2019   Procedure:  ESOPHAGOGASTRODUODENOSCOPY (EGD) WITH PROPOFOL;  Surgeon: Ronnette Juniper, MD;  Location: [place];  Service: Gastroenterology;  Laterality: N/A;   GIVENS CAPSULE STUDY N/A 06/29/2019   Procedure: GIVENS CAPSULE STUDY;  Surgeon: Wilford Corner, MD;  Location: [place];  Service: Endoscopy;  Laterality: N/A;   LEFT HEART CATH AND CORONARY ANGIOGRAPHY N/A 12/28/2016   Procedure: Left Heart Cath and Coronary Angiography;  Surgeon: Larey Dresser, MD;  Location: [place] [facility];  Service: Cardiovascular;  Laterality: N/A;   LEFT HEART CATHETERIZATION WITH CORONARY ANGIOGRAM N/A 12/19/2011   Procedure: LEFT HEART CATHETERIZATION WITH CORONARY ANGIOGRAM;  Surgeon: Larey Dresser, MD;  Location: [facility] C[facility];  Service: Cardiovascular;  Laterality: N/A;   OSTEOCHONDROMA EXCISION Left 1972   "took bone tumor off my shoulder"   POLYPECTOMY  04/28/2019   Procedure: POLYPECTOMY;  Surgeon: Ronnette Juniper, MD;  Location: [facility]OS[facility];  Service: Gastroenterology;;   PRESSURE SENSOR/CARDIOMEMS N/A 02/01/2020   Procedure: PRESSURE SENSOR/CARDIOMEMS;  Surgeon: Larey Dresser, MD;  Location: [place] [facility];  Service: Cardiovascular;  Laterality: N/A;   RIGHT HEART CATH N/A 02/01/2020   Procedure: RIGHT HEART CATH;  Surgeon: Larey Dresser, MD;  Location: [place] [facility];  Service: Cardiovascular;  Laterality: N/A;   RIGHT/LEFT HEART CATH AND CORONARY ANGIOGRAPHY N/A 07/01/2019   Procedure: RIGHT/LEFT HEART CATH AND CORONARY ANGIOGRAPHY;  Surgeon: Larey Dresser, MD;  Location: [place] [facility];  Service: Cardiovascular;  Laterality: N/A;     The history is provided by the patient. No language interpreter was used.  Chest Pain Associated symptoms: shortness of breath   Associated symptoms: no abdominal pain, no cough, no dysphagia, no fever, no headache, no nausea, no palpitations and no vomiting        Home Medications Prior to Admission medications   Medication Sig Start Date End Date  Taking? Authorizing Provider  acetaminophen (TYLENOL) 500 MG tablet Take 1,000 mg by mouth every 6 (six) hours as needed (pain).   Yes [provider]  albuterol (VENTOLIN HFA) 108 (90 Base) MCG/ACT inhaler Inhale 1-2 puffs into the lungs every 6 (six) hours as needed for wheezing or shortness of breath. 04/06/20  Yes Yu, Amy V, PA-C  apixaban (ELIQUIS) 5 MG TABS tablet Take 1 tablet (5 mg total) by mouth 2 (two) times daily. 05/24/21  Yes [place], Maricela Bo, FNP  atorvastatin (LIPITOR) 80 MG tablet Take 1 tablet by mouth once daily 12/13/21  Yes [place], Maricela Bo, FNP  carvedilol (COREG) 25 MG tablet TAKE 1 TABLET BY MOUTH TWICE DAILY WITH A MEAL 06/26/21  Yes Larey Dresser, MD  colchicine 0.6 MG tablet On day 1 take 2 tablets at first sign of gout flare.  Take 1 tablet 1 hour later.  Then take 1 tablet daily starting the next day until resolution of gout flare. Patient taking differently: Take 0.6 mg by mouth daily. On day 1 take 2 tablets at first sign of gout flare.  Take 1 tablet 1 hour later.  Then take 1 tablet daily starting the next day until resolution of gout flare  as needed. 05/24/21  Yes [place], Maricela Bo, FNP  empagliflozin (JARDIANCE) 10 MG TABS tablet Take 1 tablet (10 mg total) by mouth daily before breakfast. 03/21/21  Yes Milford, Jessica M, FNP  hydrALAZINE (APRESOLINE) 50 MG tablet TAKE 1 & 1/2 (ONE & ONE-HALF) TABLETS BY MOUTH TWICE DAILY. MUST MAKE FOLLOW UP APPT FOR FURTHER REFILLS 02/01/21  Yes Larey Dresser, MD  insulin glargine (LANTUS SOLOSTAR) 100 UNIT/ML Solostar Pen Inject 40 Units into the skin every morning. And pen needles 1/day 02/27/21  Yes Renato Shin, MD  isosorbide mononitrate (IMDUR) 120 MG 24 hr tablet Take 1 tablet (120 mg total) by mouth daily. 05/24/21  Yes [place], Maricela Bo, FNP  metFORMIN (GLUCOPHAGE) 1000 MG tablet Take 1,000 mg by mouth daily with breakfast. 01/15/22  Yes Elayne Snare, MD  nitroGLYCERIN (NITROSTAT) 0.4 MG SL tablet Place 1 tablet  (0.4 mg total) under the tongue every 5 (five) minutes as needed for chest pain (x 3 tabs daily). 09/07/20  Yes Milford, Maricela Bo, FNP  sotalol (BETAPACE) 120 MG tablet Take 1 tablet (120 mg total) by mouth 2 (two) times daily. 03/21/21  Yes [place]ford, Maricela Bo, FNP  torsemide (DEMADEX) 20 MG tablet Take 80 mg by mouth 2 (two) times daily.   Yes [provider]  ACCU-CHEK SOFTCLIX LANCETS lancets Use as instructed for three times daily testing of blood glucose 09/06/16   Jegede, Olugbemiga E, MD  amLODipine (NORVASC) 10 MG tablet Take 1 tablet (10 mg total) by mouth daily. Patient not taking: Reported on 01/22/2022 06/21/21   Rafael Bihari, FNP  blood glucose meter kit and supplies KIT Dispense based on patient and insurance preference. Use up to four times daily as directed. (FOR ICD-9 250.00, 250.01). 07/20/20   [name], MD  Blood Glucose Monitoring Suppl (ACCU-CHEK AVIVA PLUS) w/Device KIT Use as directed for 3 times daily testing of blood glucose 09/06/16   Jegede, Olugbemiga E, MD  eplerenone (INSPRA) 25 MG tablet Take 1 tablet (25 mg total) by mouth daily. Patient not taking: Reported on 01/05/2022 05/24/21   Rafael Bihari, FNP  glucose blood (ACCU-CHEK AVIVA PLUS) test strip Use as instructed for 3 times daily testing of blood glucose 09/06/16   Jegede, Olugbemiga E, MD  Insulin Pen Needle (PEN NEEDLES) 32G X 4 MM MISC 1 each by Does not apply route daily. 05/07/21   Renato Shin, MD  Lancet Devices [facility]) lancets Use as instructed for 3 times daily testing of blood glucose 09/06/16   Jegede, Marlena Clipper, MD  meclizine (ANTIVERT) 12.5 MG tablet Take 1 tablet (12.5 mg total) by mouth 3 (three) times daily as needed for dizziness. Patient not taking: Reported on 01/22/2022 04/30/21   Quintella Reichert, MD  TRUEPLUS LANCETS 28G MISC 1 each by Does not apply route 3 (three) times daily. 08/22/16   Tresa Garter, MD      Allergies    Shrimp [shellfish allergy],  Ace inhibitors, and Other    Review of Systems   Review of Systems  Constitutional:  Negative for chills and fever.  HENT:  Negative for facial swelling and trouble swallowing.   Eyes:  Negative for photophobia and visual disturbance.  Respiratory:  Positive for shortness of breath. Negative for cough.   Cardiovascular:  Positive for chest pain. Negative for palpitations.  Gastrointestinal:  Negative for abdominal pain, nausea and vomiting.  Endocrine: Negative for polydipsia and polyuria.  Genitourinary:  Negative for difficulty urinating and hematuria.  Musculoskeletal:  Negative for gait problem and joint swelling.  Skin:  Negative for pallor and rash.  Neurological:  Negative for syncope and headaches.  Psychiatric/Behavioral:  Negative for agitation and confusion.     Physical Exam Updated Vital Signs BP (!) 141/91   Pulse (!) 54   Temp (!) 97.5 F (36.4 C) (Oral)   Resp 16   Ht 6' (1.829 m)   Wt 93.9 kg   SpO2 97%   BMI 28.07 kg/m  Physical Exam Vitals and nursing note reviewed.  Constitutional:      General: He is not in acute distress.    Appearance: He is well-developed. He is not ill-appearing.  HENT:     Head: Normocephalic and atraumatic.     Right Ear: External ear normal.     Left Ear: External ear normal.     Mouth/Throat:     Mouth: Mucous membranes are moist.  Eyes:     General: No scleral icterus. Cardiovascular:     Rate and Rhythm: Normal rate and regular rhythm.     Pulses: Normal pulses.     Heart sounds: Normal heart sounds.     No S3 or S4 sounds.  Pulmonary:     Effort: Pulmonary effort is normal. No respiratory distress.     Breath sounds: Normal breath sounds.  Abdominal:     General: Abdomen is flat.     Palpations: Abdomen is soft.     Tenderness: There is no abdominal tenderness.  Musculoskeletal:        General: Normal range of motion.     Cervical back: Normal range of motion.     Right lower leg: No edema.     Left lower leg:  No edema.  Skin:    General: Skin is warm and dry.     Capillary Refill: Capillary refill takes less than 2 seconds.  Neurological:     Mental Status: He is alert and oriented to person, place, and time.  Psychiatric:        Mood and Affect: Mood normal.        Behavior: Behavior normal.     ED Results / Procedures / Treatments   Labs (all labs ordered are listed, but only abnormal results are displayed) Labs Reviewed  BASIC METABOLIC PANEL - Abnormal; Notable for the following components:      Result Value   CO2 20 (*)    Glucose, Bld 323 (*)    All other components within normal limits  BRAIN NATRIURETIC PEPTIDE - Abnormal; Notable for the following components:   B Natriuretic Peptide 118.4 (*)    All other components within normal limits  TROPONIN I (HIGH SENSITIVITY) - Abnormal; Notable for the following components:   Troponin I (High Sensitivity) 18 (*)    All other components within normal limits  CBC  TROPONIN I (HIGH SENSITIVITY)    EKG EKG Interpretation  Date/Time:  Monday January 22 2022 07:40:44 EDT Ventricular Rate:  74 PR Interval:  251 QRS Duration: 91 QT Interval:  413 QTC Calculation: 459 R Axis:   50 Text Interpretation: Normal sinus rhythm Repol abnrm suggests ischemia, anterolateral similar to prior Reconfirmed by Wynona Dove ([id number]) on 01/22/2022 7:56:15 AM  Radiology CT Angio Chest PE W and/or Wo Contrast  Result Date: 01/22/2022 CLINICAL DATA:  Concern for pulmonary embolus EXAM: CT ANGIOGRAPHY CHEST WITH CONTRAST TECHNIQUE: Multidetector CT imaging of the chest was performed using the standard protocol during bolus administration of intravenous contrast.  Multiplanar CT image reconstructions and MIPs were obtained to evaluate the vascular anatomy. RADIATION DOSE REDUCTION: This exam was performed according to the departmental dose-optimization program which includes automated exposure control, adjustment of the mA and/or kV according to patient size  and/or use of iterative reconstruction technique. CONTRAST:  115m OMNIPAQUE IOHEXOL 350 MG/ML SOLN COMPARISON:  None Available. FINDINGS: Cardiovascular: Satisfactory opacification of the pulmonary arteries to the segmental level. No evidence of pulmonary embolism. Cardiomegaly. Pericardial effusion. Atherosclerotic disease of the thoracic aorta. CardioMems device noted in a left lower lobe pulmonary artery. Mediastinum/Nodes: Small hiatal hernia and patulous esophagus. Thyroid is unremarkable. Mildly enlarged mediastinal and right hilar lymph nodes, likely reactive. Reference AP window lymph node measuring 1.1 cm in short axis on series 5, image 52. Reference right hilar lymph node measuring 1 point cm in short axis on series 5, image 71. Lungs/Pleura: Small amount of debris seen in the right mainstem bronchus. Bronchial wall thickening and smooth interlobular septal thickening seen in the lower lungs, likely due to pulmonary edema. No consolidation, pleural effusion, or pneumothorax. Solid pulmonary nodule of the right middle lobe measuring 4 mm on series 7, image 163. Upper Abdomen: Cholecystectomy clips.  No acute abnormality. Musculoskeletal: No chest wall abnormality. No acute or significant osseous findings. Review of the MIP images confirms the above findings. IMPRESSION: 1. No evidence of acute pulmonary embolus. 2. Cardiomegaly and mild pulmonary edema. 3. Solid 4 mm pulmonary nodule of the right middle lobe. No follow-up needed if patient is low-risk.This recommendation follows the consensus statement: Guidelines for Management of Incidental Pulmonary Nodules Detected on CT Images: From the Fleischner Society 2017; Radiology 2017; 284:228-243. 4. Aortic Atherosclerosis (ICD10-I70.0). Electronically Signed   By: LYetta GlassmanM.D.   On: 01/22/2022 08:45   DG Chest 2 View  Result Date: 01/21/2022 CLINICAL DATA:  Chest pain. EXAM: CHEST - 2 VIEW COMPARISON:  April 06, 2020 FINDINGS: The heart size  and mediastinal contours are within normal limits. There is no evidence of acute infiltrate, pleural effusion or pneumothorax. Radiopaque surgical clips are seen within the right upper quadrant. The visualized skeletal structures are unremarkable. IMPRESSION: No acute cardiopulmonary disease. Electronically Signed   By: TVirgina NorfolkM.D.   On: 01/21/2022 18:05    Procedures .Critical Care  Performed by: GJeanell Sparrow DO Authorized by: GJeanell Sparrow DO   Critical care provider statement:    Critical care time (minutes):  31   Critical care time was exclusive of:  Separately billable procedures and treating other patients   Critical care was necessary to treat or prevent imminent or life-threatening deterioration of the following conditions:  Cardiac failure   Critical care was time spent personally by me on the following activities:  Development of treatment plan with patient or surrogate, discussions with consultants, evaluation of patient's response to treatment, examination of patient, ordering and review of laboratory studies, ordering and review of radiographic studies, ordering and performing treatments and interventions, pulse oximetry, re-evaluation of patient's condition, review of old charts and obtaining history from patient or surrogate   Care discussed with: admitting provider       Medications Ordered in ED Medications  nitroGLYCERIN (NITROSTAT) SL tablet 0.4 mg (0.4 mg Sublingual Given 01/22/22 0824)  aspirin chewable tablet 324 mg (324 mg Oral Given 01/22/22 0823)  sodium chloride 0.9 % bolus 500 mL (500 mLs Intravenous New Bag/Given 01/22/22 0848)  iohexol (OMNIPAQUE) 350 MG/ML injection 100 mL (100 mLs Intravenous Contrast Given 01/22/22 0833)  ED Course/ Medical Decision Making/ A&P Clinical Course as of 01/22/22 1040  Mon Jan 22, 2022  0746 Troponin I (High Sensitivity) [KZ]  [phone] Troponin I (High Sensitivity): 16 [KZ]  0811 CT Angio Chest PE W and/or Wo  Contrast [KZ]  0909 3. Solid 4 mm pulmonary nodule of the right middle lobe. No  follow-up needed if patient is low-risk.This recommendation follows  the consensus statement: Guidelines for Management of Incidental  Pulmonary Nodules Detected on CT Images: From the Fleischner Society  2017; Radiology 2017; 284:228-243.  [SG]    Clinical Course User Index [KZ] Rinaldo Cloud, Student-PA [SG] Jeanell Sparrow, DO                           Medical Decision Making Amount and/or Complexity of Data Reviewed Labs: ordered. Radiology: ordered. ECG/medicine tests: ordered.  Risk OTC drugs. Prescription drug management. Decision regarding hospitalization.    CC: cp, dib  This patient presents to the Emergency Department for the above complaint. This involves an extensive number of treatment options and is a complaint that carries with it a high risk of complications and morbidity. Vital signs were reviewed. Serious etiologies considered.  Differential includes all life-threatening causes for chest pain. This includes but is not exclusive to acute coronary syndrome, aortic dissection, pulmonary embolism, cardiac tamponade, community-acquired pneumonia, pericarditis, musculoskeletal chest wall pain, etc.  Record review:  Previous records obtained and reviewed  Prior ED visits, prior consultant notes, prior echo 5/21 with HFpEF  Additional history obtained from sister at bedside  Medical and surgical history as noted above.   Work up as above, notable for:  Labs & imaging results that were available during my care of the patient were visualized by me and considered in my medical decision making.   Wells score is moderate, he has minor elevation to his troponin, will get CTPE HEART score is a 7  I ordered imaging studies which included CXR, CTPE. I visualized the imaging, interpreted images, and I agree with radiologist interpretation. CXR stable, CTPE with mild pulmonary edema, also  pulmonary nodule.   Cardiac monitoring reviewed and interpreted personally which shows NSR  Personally discussed patient care with consultant; Dr Liane Comber reviewed: initial trop 18, rpt is 68, BNP mildly elevated; he has hx of diastolic CHF. Labs otherwise stable.   Management: Give ASA and NTG PO as pt still having some chest pain, mildly worsened while in the ED Pain improved following NTG, he is HDS, continues on cardiac monitoring  Reassessment:  Given heart score of 7, ongoing symptoms will discuss with cardiology.   Admission was considered.   Discussed with cardiology Dr. Margaretann Loveless; agree with plan for admission.  They are requesting hospitalist admission given patient's myriad of chronic medical complaints.  They will see patient on consult.   Spoke with Dr Lorin Mercy who accepts pt for admission.           Social determinants of health include -  Social History   Socioeconomic History   Marital status: Widowed    Spouse name: Not on file   Number of children: 2   Years of education: 14   Highest education level: Some college, no degree  Occupational History    Employer: UNEMPLOYED  Tobacco Use   Smoking status: Former    Packs/day: 4.00    Years: 18.00    Total pack years: 72.00    Types: Cigarettes    Quit  date: 08/14/1983    Years since quitting: 38.4   Smokeless tobacco: Never  Vaping Use   Vaping Use: Never used  Substance and Sexual Activity   Alcohol use: Yes    Alcohol/week: 16.0 standard drinks of alcohol    Types: 6 Cans of beer, 10 Shots of liquor per week    Comment: occasionally   Drug use: Yes    Types: Marijuana    Comment: twice a week   Sexual activity: Never  Other Topics Concern   Not on file  Social History Narrative   Not on file   Social Determinants of Health   Financial Resource Strain: Low Risk  (07/19/2021)   Overall Financial Resource Strain (CARDIA)    Difficulty of Paying Living Expenses: Not hard at all  Food  Insecurity: Food Insecurity Present (12/11/2021)   Hunger Vital Sign    Worried About [place] in the Last Year: Sometimes true    Ran Out of Food in the Last Year: Never true  Transportation Needs: No Transportation Needs (12/11/2021)   PRAPARE - Hydrologist (Medical): No    Lack of Transportation (Non-Medical): No  Physical Activity: Insufficiently Active (07/19/2021)   Exercise Vital Sign    Days of Exercise per Week: 1 day    Minutes of Exercise per Session: 10 min  Stress: No Stress Concern Present (07/19/2021)   [place]    Feeling of Stress : Not at all  Social Connections: Socially Isolated (07/19/2021)   Social Connection and Isolation Panel [NHANES]    Frequency of Communication with Friends and Family: More than three times a week    Frequency of Social Gatherings with Friends and Family: More than three times a week    Attends Religious Services: Never    Marine scientist or Organizations: No    Attends Archivist Meetings: Never    Marital Status: Widowed  Intimate Partner Violence: Not At Risk (07/19/2021)   Humiliation, Afraid, Rape, and Kick questionnaire    Fear of Current or Ex-Partner: No    Emotionally Abused: No    Physically Abused: No    Sexually Abused: No      This chart was dictated using voice recognition software.  Despite best efforts to proofread,  errors can occur which can change the documentation meaning.         Final Clinical Impression(s) / ED Diagnoses Final diagnoses:  Unstable angina ([place])  Pulmonary edema with congestive heart failure [facility])  Pulmonary nodule  Chest pain with high risk for cardiac etiology    Rx / DC Orders ED Discharge Orders     None         Jeanell Sparrow, DO 01/22/22 1038    Wynona Dove A, DO 01/22/22 1040

## 2022-01-22 NOTE — Consult Note (Addendum)
Cardiology Consultation:   Patient ID: [name] [name] MRN: [id number]; DOB: 10-Jun-1956  Admit date: 01/21/2022 Date of Consult: 01/22/2022  PCP:  [name], MD   [facility] Providers Cardiologist:  Loralie Champagne, MD        Patient Profile:   [name] [name] is a 66 y.o. male with a hx of HTN, DM-2, PAF and CAD with diffuse distal and branch vessel CAD and on last cath no good interventional targets who is being seen 01/22/2022 for the evaluation of chest pain at the request of Dr. Lorin Mercy.  History of Present Illness:   [name] [name] with hx of chest pain dating back to 2013 diffuse severe distal and branch vessel disease.  EF then 45%.  No interventional options -echo at that time was 60-65%. He has had evaluations since that time for HTN and chest pain.  At one point ETT with EF 31% but no ischemia and cMRI with EF 44%.    In 2016 for exertional dyspnea he had cardiac cath with He had a severe distal RCA stenosis that was treated with DES x 2.  Echo showed EF 55-60%.    cath in 2017, 2018 and 2020 with same diffuse CAD with no good interventional options including not good anatomy for CABG.  Cardiomyopathy: Suspect mixed ischemic/nonischemic (?ETOH-related).   Cardiomims device placed in 2021.  He has recurrent episodes of atrial flutter/fib with atrial flutter ablation in 2007 at least twice and placed on sotalol in 2017 converted to SR. He is on eliquis.  He has been since since in ER with atrial flutter but zio monitor last year with SR and PACs.    Other hx of GI bleed with no AVMs on capsule endoscopy.  Hx of syncope with vigorous coughing.  Gout, DM-2 on insulin.   Last visit with Dr. Aundra Dubin 01/05/22 with mild dyspnea walking of flat areas. Not using cardiomims. Does not take meds regulatory.  Wears his CPAP 2-3 nights per week.  He was supposed to resume eplerenone (gynecomastia with spiro in past) along with toresmide - he has not been taking amlodipine though  this was for anginal control.  AVOID ranolazine with sotalol use    Presented to ER last evening with chest pain and SOB. Began 2 days ago.  + for COVID 19 dx 01/14/22. He was started on paxlovid but did not complete.  The pain does not feel like his usual angina.  NTG did not really help.  He feels like it is gone mostly now.    EKG:  The EKG was personally reviewed and demonstrates:  SR at 71 with PR 256 ms, ST depression II, III, AVF and V5-6 similar to EKG 01/05/22  with no chest pain Qtc 459.  Telemetry:  Telemetry was personally reviewed and demonstrates:  SR with 1st degree AV block and non conducted PACs  Na 133, K+ 4.0 CL 96 glucose 454, Cr 1.83 Hgb 15.8 WBC 5.3 plts 185  Hs troponin 16 and 18  BNP 118  BP 146/83 R 67 afebrile R 16-35.   Chest CTA :   IMPRESSION: 1. No evidence of acute pulmonary embolus. 2. Cardiomegaly and mild pulmonary edema. 3. Solid 4 mm pulmonary nodule of the right middle lobe. No follow-up needed if patient is low-risk.This recommendation follows the consensus statement: Guidelines for Management of Incidental Pulmonary Nodules Detected on CT Images: From the Fleischner Society 2017; Radiology 2017; 284:228-243. 4. Aortic Atherosclerosis (ICD10-I70.0).     Past Medical History:  Diagnosis Date   Anxiety    CAD (coronary artery disease), native coronary artery    a. Nonobstructive CAD by cath 2013 - diffuse distal and branch vessel CAD, no severe disease in the major coronaries, LV mild global hypokinesis, EF 45%. b. ETT-Sestamibi 5/14: EF 31%, small fixed inferior defect with no ischemia.   Chronic CHF ([place])    a. Mixed ICM/NICM (?EtOH). EF 35% in 2008. Echo 5/13: EF 60-65%, mod LVH, EF 45% on V gram in 12/2011. EF 12/2012: EF 50-55%, mild LVH, inferobasal HK, mild MR. ETT-Ses 5/14 EF 41%. Cardiac MRI 5/14: EF 44%, mild global HK, subepicardial delayed enhancement in nonspecific RV insertion pattern.   COLONIC POLYPS, HX OF 12/30/2006   Gout    H/O  atrial flutter 08/13/2005   a. Ablations in 2007, 2008.   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   "left shoulder"   PAF (paroxysmal atrial fibrillation) ([place])    On coumadin   Sleep apnea    "suppose to send mask but they never did" (05/03/2015)   Type II diabetes mellitus ([place])     Past Surgical History:  Procedure Laterality Date   A FLUTTER ABLATION  2007, 2008   catheter ablation    CARDIAC CATHETERIZATION N/A 05/03/2015   Procedure: Right/Left Heart Cath and Coronary Angiography;  Surgeon: Larey Dresser, MD;  Location: [place] [facility];  Service: Cardiovascular;  Laterality: N/A;   CARDIAC CATHETERIZATION N/A 05/03/2015   Procedure: Coronary Stent Intervention;  Surgeon: Jettie Booze, MD;  Location: [place] [facility];  Service: Cardiovascular;  Laterality: N/A;   CARDIAC CATHETERIZATION N/A 11/04/2015   Procedure: Left Heart Cath and Coronary Angiography;  Surgeon: Larey Dresser, MD;  Location: [place] [facility];  Service: Cardiovascular;  Laterality: N/A;   CARDIOVERSION N/A 08/24/2015   Procedure: CARDIOVERSION;  Surgeon: Thayer Headings, MD;  Location: [facility] ENDOSCOPY;  Service: Cardiovascular;  Laterality: N/A;   CHOLECYSTECTOMY N/A 02/07/2016   Procedure: LAPAROSCOPIC CHOLECYSTECTOMY WITH  INTRAOPERATIVE CHOLANGIOGRAM;  Surgeon: Greer Pickerel, MD;  Location: [facility];  Service: General;  Laterality: N/A;   COLONOSCOPY WITH PROPOFOL N/A 04/28/2019   Procedure: COLONOSCOPY WITH PROPOFOL;  Surgeon: Ronnette Juniper, MD;  Location: [place];  Service: Gastroenterology;  Laterality: N/A;   CORONARY ANGIOPLASTY  12/05/01   ESOPHAGOGASTRODUODENOSCOPY (EGD) WITH  PROPOFOL N/A 04/27/2019   Procedure: ESOPHAGOGASTRODUODENOSCOPY (EGD) WITH PROPOFOL;  Surgeon: Ronnette Juniper, MD;  Location: [place];  Service: Gastroenterology;  Laterality: N/A;   GIVENS CAPSULE STUDY N/A 06/29/2019   Procedure: GIVENS CAPSULE STUDY;  Surgeon: Wilford Corner, MD;  Location: [place];  Service: Endoscopy;  Laterality: N/A;   LEFT HEART CATH AND CORONARY ANGIOGRAPHY N/A 12/28/2016   Procedure: Left Heart Cath and Coronary Angiography;  Surgeon: Larey Dresser, MD;  Location: [place] [facility];  Service: Cardiovascular;  Laterality: N/A;   LEFT HEART CATHETERIZATION WITH CORONARY ANGIOGRAM N/A 12/19/2011   Procedure: LEFT HEART CATHETERIZATION WITH CORONARY ANGIOGRAM;  Surgeon: Larey Dresser, MD;  Location[facility] C[facility];  Service: Cardiovascular;  Laterality: N/A;   OSTEOCHONDROMA EXCISION Left 1972   "took bone tumor off my shoulder"  POLYPECTOMY  04/28/2019   Procedure: POLYPECTOMY;  Surgeon: Ronnette Juniper, MD;  Location: [facility] ENDOSCOPY;  Service: Gastroenterology;;   PRESSURE SENSOR/CARDIOMEMS N/A 02/01/2020   Procedure: PRESSURE SENSOR/CARDIOMEMS;  Surgeon: Larey Dresser, MD;  Location: [place] [facility];  Service: Cardiovascular;  Laterality: N/A;   RIGHT HEART CATH N/A 02/01/2020   Procedure: RIGHT HEART CATH;  Surgeon: Larey Dresser, MD;  Location: [place] [facility];  Service: Cardiovascular;  Laterality: N/A;   RIGHT/LEFT HEART CATH AND CORONARY ANGIOGRAPHY N/A 07/01/2019   Procedure: RIGHT/LEFT HEART CATH AND CORONARY ANGIOGRAPHY;  Surgeon: Larey Dresser, MD;  Location: [place] [facility];  Service: Cardiovascular;  Laterality: N/A;     Home Medications:  Prior to Admission medications   Medication Sig Start Date End Date Taking? Authorizing Provider  acetaminophen (TYLENOL) 500 MG tablet Take 1,000 mg by mouth every 6 (six) hours as needed (pain).   Yes [provider]  albuterol (VENTOLIN HFA) 108 (90 Base) MCG/ACT inhaler Inhale 1-2 puffs  into the lungs every 6 (six) hours as needed for wheezing or shortness of breath. 04/06/20  Yes Yu, Amy V, PA-C  apixaban (ELIQUIS) 5 MG TABS tablet Take 1 tablet (5 mg total) by mouth 2 (two) times daily. 05/24/21  Yes Milford, Maricela Bo, FNP  atorvastatin (LIPITOR) 80 MG tablet Take 1 tablet by mouth once daily 12/13/21  Yes Milford, Maricela Bo, FNP  carvedilol (COREG) 25 MG tablet TAKE 1 TABLET BY MOUTH TWICE DAILY WITH A MEAL 06/26/21  Yes Larey Dresser, MD  colchicine 0.6 MG tablet On day 1 take 2 tablets at first sign of gout flare.  Take 1 tablet 1 hour later.  Then take 1 tablet daily starting the next day until resolution of gout flare. Patient taking differently: Take 0.6 mg by mouth daily. On day 1 take 2 tablets at first sign of gout flare.  Take 1 tablet 1 hour later.  Then take 1 tablet daily starting the next day until resolution of gout flare as needed. 05/24/21  Yes Milford, Maricela Bo, FNP  empagliflozin (JARDIANCE) 10 MG TABS tablet Take 1 tablet (10 mg total) by mouth daily before breakfast. 03/21/21  Yes Milford, Jessica M, FNP  hydrALAZINE (APRESOLINE) 50 MG tablet TAKE 1 & 1/2 (ONE & ONE-HALF) TABLETS BY MOUTH TWICE DAILY. MUST MAKE FOLLOW UP APPT FOR FURTHER REFILLS 02/01/21  Yes Larey Dresser, MD  insulin glargine (LANTUS SOLOSTAR) 100 UNIT/ML Solostar Pen Inject 40 Units into the skin every morning. And pen needles 1/day 02/27/21  Yes Renato Shin, MD  isosorbide mononitrate (IMDUR) 120 MG 24 hr tablet Take 1 tablet (120 mg total) by mouth daily. 05/24/21  Yes Milford, Maricela Bo, FNP  metFORMIN (GLUCOPHAGE) 1000 MG tablet Take 1,000 mg by mouth daily with breakfast. 01/15/22  Yes Elayne Snare, MD  nitroGLYCERIN (NITROSTAT) 0.4 MG SL tablet Place 1 tablet (0.4 mg total) under the tongue every 5 (five) minutes as needed for chest pain (x 3 tabs daily). 09/07/20  Yes Milford, Maricela Bo, FNP  sotalol (BETAPACE) 120 MG tablet Take 1 tablet (120 mg total) by mouth 2 (two) times daily.  03/21/21  Yes Milford, Maricela Bo, FNP  torsemide (DEMADEX) 20 MG tablet Take 80 mg by mouth 2 (two) times daily.   Yes [provider]  ACCU-CHEK SOFTCLIX LANCETS lancets Use as instructed for three times daily testing of blood glucose 09/06/16   Jegede, Olugbemiga E, MD  amLODipine (NORVASC) 10 MG tablet Take  1 tablet (10 mg total) by mouth daily. Patient not taking: Reported on 01/22/2022 06/21/21   Rafael Bihari, FNP  blood glucose meter kit and supplies KIT Dispense based on patient and insurance preference. Use up to four times daily as directed. (FOR ICD-9 250.00, 250.01). 07/20/20   [name], MD  Blood Glucose Monitoring Suppl (ACCU-CHEK AVIVA PLUS) w/Device KIT Use as directed for 3 times daily testing of blood glucose 09/06/16   Jegede, Olugbemiga E, MD  eplerenone (INSPRA) 25 MG tablet Take 1 tablet (25 mg total) by mouth daily. Patient not taking: Reported on 01/05/2022 05/24/21   Rafael Bihari, FNP  glucose blood (ACCU-CHEK AVIVA PLUS) test strip Use as instructed for 3 times daily testing of blood glucose 09/06/16   Jegede, Olugbemiga E, MD  Insulin Pen Needle (PEN NEEDLES) 32G X 4 MM MISC 1 each by Does not apply route daily. 05/07/21   Renato Shin, MD  Lancet Devices [facility]) lancets Use as instructed for 3 times daily testing of blood glucose 09/06/16   Jegede, Marlena Clipper, MD  TRUEPLUS LANCETS 28G MISC 1 each by Does not apply route 3 (three) times daily. 08/22/16   Tresa Garter, MD    Inpatient Medications: Scheduled Meds:  apixaban  5 mg Oral BID   atorvastatin  80 mg Oral Daily   carvedilol  25 mg Oral BID WC   hydrALAZINE  75 mg Oral BID   insulin aspart  0-15 Units Subcutaneous TID WC   insulin aspart  0-5 Units Subcutaneous QHS   insulin aspart  4 Units Subcutaneous TID WC   insulin glargine-yfgn  40 Units Subcutaneous Daily   isosorbide mononitrate  120 mg Oral Daily   torsemide  80 mg Oral BID   Continuous Infusions:  PRN  Meds: acetaminophen, albuterol, ondansetron (ZOFRAN) IV  Allergies:    Allergies  Allergen Reactions   Shrimp [Shellfish Allergy] Nausea And Vomiting   Ace Inhibitors Cough   Other Hives and Other (See Comments)    Patient reports developing hives after receiving "some antibiotic given in 1980''s at [facility]". He does not know which antibiotic.    Social History:   Social History   Socioeconomic History   Marital status: Widowed    Spouse name: Not on file   Number of children: 2   Years of education: 14   Highest education level: Some college, no degree  Occupational History    Employer: UNEMPLOYED  Tobacco Use   Smoking status: Former    Packs/day: 4.00    Years: 18.00    Total pack years: 72.00    Types: Cigarettes    Quit date: 08/14/1983    Years since quitting: 38.4   Smokeless tobacco: Never  Vaping Use   Vaping Use: Never used  Substance and Sexual Activity   Alcohol use: Yes    Alcohol/week: 16.0 standard drinks of alcohol    Types: 6 Cans of beer, 10 Shots of liquor per week    Comment: 2-3 beers watching sports; occasional glass of liquor; drinks heavily in football season   Drug use: Yes    Types: Marijuana    Comment: twice a week   Sexual activity: Never  Other Topics Concern   Not on file  Social History Narrative   Not on file   Social Determinants of Health   Financial Resource Strain: Low Risk  (07/19/2021)   Overall Financial Resource Strain (CARDIA)    Difficulty of Paying  Living Expenses: Not hard at all  Food Insecurity: Food Insecurity Present (12/11/2021)   Hunger Vital Sign    Worried About Running Out of Food in the Last Year: Sometimes true    Ran Out of Food in the Last Year: Never true  Transportation Needs: No Transportation Needs (12/11/2021)   PRAPARE - Hydrologist (Medical): No    Lack of Transportation (Non-Medical): No  Physical Activity: Insufficiently Active (07/19/2021)   Exercise  Vital Sign    Days of Exercise per Week: 1 day    Minutes of Exercise per Session: 10 min  Stress: No Stress Concern Present (07/19/2021)   [place]    Feeling of Stress : Not at all  Social Connections: Socially Isolated (07/19/2021)   Social Connection and Isolation Panel [NHANES]    Frequency of Communication with Friends and Family: More than three times a week    Frequency of Social Gatherings with Friends and Family: More than three times a week    Attends Religious Services: Never    Marine scientist or Organizations: No    Attends Archivist Meetings: Never    Marital Status: Widowed  Intimate Partner Violence: Not At Risk (07/19/2021)   Humiliation, Afraid, Rape, and Kick questionnaire    Fear of Current or Ex-Partner: No    Emotionally Abused: No    Physically Abused: No    Sexually Abused: No    Family History:    Family History  Problem Relation Age of Onset   Diabetes Mother    Hypertension Mother    Heart attack Neg Hx    Stroke Neg Hx      ROS:  Please see the history of present illness.  General:no colds or fevers, no weight changes, + COVID + Skin:no rashes or ulcers HEENT:no blurred vision, no congestion CV:see HPI PUL:see HPI GI:no diarrhea constipation or melena, no indigestion GU:no hematuria, no dysuria MS:no joint pain, no claudication, hx gout Neuro:no syncope, no lightheadedness Endo:+ diabetes has been elevated , no thyroid disease  All other ROS reviewed and negative.     Physical Exam/Data:   Vitals:   01/22/22 0915 01/22/22 1015 01/22/22 1130 01/22/22 1200  BP: (!) 156/90 (!) 141/91 140/81 (!) 156/98  Pulse:  (!) 54  69  Resp: '15 16 15 15  ' Temp:      TempSrc:      SpO2: 97% 97%  99%  Weight:      Height:        Intake/Output Summary (Last 24 hours) at 01/22/2022 1315 Last data filed at 01/22/2022 1136 Gross per 24 hour  Intake 500 ml  Output --   Net 500 ml      01/22/2022    7:39 AM 01/21/2022    5:20 PM 01/13/2022    9:35 PM  Last 3 Weights  Weight (lbs) 207 lb 207 lb 205 lb  Weight (kg) 93.895 kg 93.895 kg 92.987 kg     Body mass index is 28.07 kg/m.  General:  Well nourished, well developed, in no acute distress  HEENT: normal Neck: no JVD Vascular: No carotid bruits; Distal pulses 2+ bilaterally Cardiac:  normal S1, S2; RRR; no murmur gallup rub or click Lungs:  + harsh rhonchi in Rt base to auscultation bilaterally, no wheezing, scattered rales  Abd: soft, nontender, no hepatomegaly  Ext: no edema Musculoskeletal:  No deformities, BUE and BLE strength normal  and equal Skin: warm and dry  Neuro:  alert and oriented X 3 MAE follows commands no focal abnormalities noted Psych:  Normal affect    Relevant CV Studies:  05/01/21 zio patch  mostly SR no high grade arrhythmia or atrial fib, freq PACs.   Rt Heart cath 01/2020 Right Heart Pressures [facility] Procedural Findings: Hemodynamics (mmHg) RA mean 5 RV 38/8 PA 44/9, mean 24 PCWP mean 19  Oxygen saturations: PA 69% AO 98%  Cardiac Output (Fick) 6.23  Cardiac Index (Fick) 2.91 PVR < 1 WU    Echo 12/28/19 IMPRESSIONS     1. Abnormal GLS -11.6 but appears to be tracking epicardium . Left  ventricular ejection fraction, by estimation, is 55%. The left ventricle  has normal function. The left ventricle has no regional wall motion  abnormalities. Left ventricular diastolic  parameters were normal.   2. Right ventricular systolic function is normal. The right ventricular  size is normal.   3. Left atrial size was mildly dilated.   4. The mitral valve is normal in structure. Trivial mitral valve  regurgitation. No evidence of mitral stenosis.   5. The aortic valve is tricuspid. Aortic valve regurgitation is trivial.  Mild aortic valve sclerosis is present, with no evidence of aortic valve  stenosis.   6. The inferior vena cava is normal in size with greater  than 50%  respiratory variability, suggesting right atrial pressure of 3 mmHg.   FINDINGS   Left Ventricle: Abnormal GLS -11.6 but appears to be tracking epicardium.  Left ventricular ejection fraction, by estimation, is 55%. The left  ventricle has normal function. The left ventricle has no regional wall  motion abnormalities. The left  ventricular internal cavity size was normal in size. There is no left  ventricular hypertrophy. Left ventricular diastolic parameters were  normal.   Right Ventricle: The right ventricular size is normal. No increase in  right ventricular wall thickness. Right ventricular systolic function is  normal.   Left Atrium: Left atrial size was mildly dilated.   Right Atrium: Right atrial size was normal in size.   Pericardium: There is no evidence of pericardial effusion.   Mitral Valve: The mitral valve is normal in structure. There is mild  thickening of the mitral valve leaflet(s). There is mild calcification of  the mitral valve leaflet(s). Normal mobility of the mitral valve leaflets.  Trivial mitral valve regurgitation.  No evidence of mitral valve stenosis.   Tricuspid Valve: The tricuspid valve is normal in structure. Tricuspid  valve regurgitation is trivial. No evidence of tricuspid stenosis.   Aortic Valve: The aortic valve is tricuspid. Aortic valve regurgitation is  trivial. Mild aortic valve sclerosis is present, with no evidence of  aortic valve stenosis.   Pulmonic Valve: The pulmonic valve was normal in structure. Pulmonic valve  regurgitation is trivial. No evidence of pulmonic stenosis.   Aorta: The aortic root is normal in size and structure.   Venous: The inferior vena cava is normal in size with greater than 50%  respiratory variability, suggesting right atrial pressure of 3 mmHg.   IAS/Shunts: No atrial level shunt detected by color flow Doppler.   Rt and left cardiac cath 07/01/19  Left Main  Mild distal LM narrowing.     Left Anterior Descending  50% ostial LAD stenosis. Diffuse 30-40% stenoses throughout the proximal and mid LAD. The distal LAD is diffusely disease culminating in an area of 95% stenosis far distally.    Ramus Intermedius  Large vessel. 40% proximal and 40% mid ramus stenosis. Small 1st branch off ramus with 80% ostial stenosis. Small to moderate 2nd branch off ramus with 95% ostial stenosis.    Left Circumflex  Relatively small LCx with comparatively large ramus. Long up to 70% ostial and proximal LCx stenosis.    Right Coronary Artery  Patent proximal and distal RCA stents, distal stent with 30% in-stent restenosis. 50-60% stenosis proximal RCA just distal to stent. Diffuse up to 60% mid RCA stenosis.       1. Normal filling pressures and preserved cardiac output.  2. Diffuse distal and branch vessel coronary disease.     I reviewed films with Dr. Burt Knack, there is no good interventional target here.  He has very extensive disease but we did not think that his anatomy was amenable to CABG with the primary LAD disease being distally, continued patency of the main ramus, small LCx likely not a good graft target, and RCA with moderate disease at most.  I think that we are left with ongoing medical management.   Laboratory Data:  High Sensitivity Troponin:   Recent Labs  Lab 01/21/22 1723 01/21/22 2100  TROPONINIHS 18* 16     Chemistry Recent Labs  Lab 01/21/22 1723  NA 138  K 4.1  CL 109  CO2 20*  GLUCOSE 323*  BUN 10  CREATININE 1.24  CALCIUM 8.9  GFRNONAA >60  ANIONGAP 9    No results for input(s): "PROT", "ALBUMIN", "AST", "ALT", "ALKPHOS", "BILITOT" in the last 168 hours. Lipids No results for input(s): "CHOL", "TRIG", "HDL", "LABVLDL", "LDLCALC", "CHOLHDL" in the last 168 hours.  Hematology Recent Labs  Lab 01/21/22 1723  WBC 5.3  RBC 5.64  HGB 15.8  HCT 49.3  MCV 87.4  MCH 28.0  MCHC 32.0  RDW 14.1  PLT 185   Thyroid No results for input(s): "TSH",  "FREET4" in the last 168 hours.  BNP Recent Labs  Lab 01/22/22 0915  BNP 118.4*    DDimer No results for input(s): "DDIMER" in the last 168 hours.   Radiology/Studies:  CT Angio Chest PE W and/or Wo Contrast  Result Date: 01/22/2022 CLINICAL DATA:  Concern for pulmonary embolus EXAM: CT ANGIOGRAPHY CHEST WITH CONTRAST TECHNIQUE: Multidetector CT imaging of the chest was performed using the standard protocol during bolus administration of intravenous contrast. Multiplanar CT image reconstructions and MIPs were obtained to evaluate the vascular anatomy. RADIATION DOSE REDUCTION: This exam was performed according to the departmental dose-optimization program which includes automated exposure control, adjustment of the mA and/or kV according to patient size and/or use of iterative reconstruction technique. CONTRAST:  177m OMNIPAQUE IOHEXOL 350 MG/ML SOLN COMPARISON:  None Available. FINDINGS: Cardiovascular: Satisfactory opacification of the pulmonary arteries to the segmental level. No evidence of pulmonary embolism. Cardiomegaly. Pericardial effusion. Atherosclerotic disease of the thoracic aorta. CardioMems device noted in a left lower lobe pulmonary artery. Mediastinum/Nodes: Small hiatal hernia and patulous esophagus. Thyroid is unremarkable. Mildly enlarged mediastinal and right hilar lymph nodes, likely reactive. Reference AP window lymph node measuring 1.1 cm in short axis on series 5, image 52. Reference right hilar lymph node measuring 1 point cm in short axis on series 5, image 71. Lungs/Pleura: Small amount of debris seen in the right mainstem bronchus. Bronchial wall thickening and smooth interlobular septal thickening seen in the lower lungs, likely due to pulmonary edema. No consolidation, pleural effusion, or pneumothorax. Solid pulmonary nodule of the right middle lobe measuring 4 mm on series 7, image  163. Upper Abdomen: Cholecystectomy clips.  No acute abnormality. Musculoskeletal: No  chest wall abnormality. No acute or significant osseous findings. Review of the MIP images confirms the above findings. IMPRESSION: 1. No evidence of acute pulmonary embolus. 2. Cardiomegaly and mild pulmonary edema. 3. Solid 4 mm pulmonary nodule of the right middle lobe. No follow-up needed if patient is low-risk.This recommendation follows the consensus statement: Guidelines for Management of Incidental Pulmonary Nodules Detected on CT Images: From the Fleischner Society 2017; Radiology 2017; 284:228-243. 4. Aortic Atherosclerosis (ICD10-I70.0). Electronically Signed   By: Yetta Glassman M.D.   On: 01/22/2022 08:45   DG Chest 2 View  Result Date: 01/21/2022 CLINICAL DATA:  Chest pain. EXAM: CHEST - 2 VIEW COMPARISON:  April 06, 2020 FINDINGS: The heart size and mediastinal contours are within normal limits. There is no evidence of acute infiltrate, pleural effusion or pneumothorax. Radiopaque surgical clips are seen within the right upper quadrant. The visualized skeletal structures are unremarkable. IMPRESSION: No acute cardiopulmonary disease. Electronically Signed   By: Virgina Norfolk M.D.   On: 01/21/2022 18:05     Assessment and Plan:   Chest pain/CAD with neg hs troponin, known CAD more distal with poor targets for PCI and anatomy not amenable for CABG.  He has not been taking his amlodipine to help prevent angina. Continue coreg, resume amlodipine. Continue imdur.  He does have stents to RCA but was patent in 2020.  ? Repeat cath defer to MD. No ASA on eliquis and neg tropoin. pAFL and PAF.  Has had flutter ablations X 2 in 2007 now on sotalol for PAF.  Was held on admit and coreg continues.  Pt is in SR with 1st degree AV block chronic and stable Qtc, will defer to MD to resume; continue anticoagulation unless plan for cath then IV heparin. Noncompliance at times not taking his meds. Was to resume inspra but had not.  OSA should use CPAP every night Chronic diastolic CHF combined at  times, he does have cardiomims device but does not use routinely. On torsemide 80 mg - CTA of chest with mild pulmonary edema, ? Dose of IV lasix will defer to MD.  HLD on atorvastatin 80 -controlled lipids in 2023 HTN elevated here resume amlodipine on coreg 25 BID, hydralazine 75 TID IMDUR 120 mg dialy  CKD-3  DM-2 on insulin per IM COVID 19 was + 01/13/22 per IM not on isolation with improvement of symptoms and timing of dx. Lung nodule 4 mm rt middle lobe - pt with hx of tobacco use but stopped in the 1980s.  Per IM possible pulmonary consult in future.   Risk Assessment/Risk Scores:     HEAR Score (for undifferentiated chest pain):   unable to           For questions or updates, please contact [place] Please consult [email or web address] for contact info under    Signed, Cecilie Kicks, NP  01/22/2022 1:15 PM  Personally seen and examined. Agree with APP above with the following comments:  Briefly 66 yo M with a history of diffuse multi-vessel CAD wioth ISR of mRCA lesion, HTN with DM, HFmrEF 45% without sending in his cardioMEMS device data, PAF who presents with chest pain after recent COVID-19 diagnosis (last week)  Patient notes he was doing well May of this year.  Mild DOE and is minimally active.  Was doing well until 01/14/22, found to have COVID-19.  He took some paxlovid.    Came with dull CP  and a twinge in his left arm.  Different that the sharp pain he has with his prior PCI and CAD.  No SOB. No Palpitations, no syncope. There are questions around some medication adherence PTA.    Exam notable for regular rhythm.  cTAB (No rhonchi during 3 PM evaluation), no edema.  Exam largely as above.    Labs notable for troponin 16-> 18, BNP 118 EKG SR 1st HB, Rare PAC, QTC 470, Inferolateral ST depression chronic from 2022  For key conditions including  CAD, PAF acquired thrombophilia, OSA, HTN with DM, HLD with DM, CKD stage IIIa  Would recommend  - we will resume the  medication he was planned to be on (eplerenone, and aldactone gynecomastia; Imdur 120, cannot be on sotalol and ranolazine; amlodipine) - his CP has resolved and he feels better - will repeat echo; if new WMA or worsening sx; we will discuss potential of LHC as we have minimal medical therapy left to offer.  He feels better and I am optimistic we will be able to medically manage.  Rudean Haskell, MD Cardiologist [facility], [address], [place] [address] [phone]  4:12 PM

## 2022-01-22 NOTE — H&P (Signed)
History and Physical    Patient: [name] [name] ZOX:[id number] DOB: 04/29/1956 DOA: 01/21/2022 DOS: the patient was seen and examined on 01/22/2022 PCP: [name], MD  Patient coming from: Home - lives alone; NOK: Daughter, Orlie Dakin, [phone]   Chief Complaint: Chest pain  HPI: Swain Acree is a 66 y.o. male with medical history significant of CAD; chronic combined CHF; atrial fib/flutter s/p ablation, on Coumadin;  ETOH dependence; HTN; HLD; remote osteochondrosarcoma; OSA not on CPAP; and DM presenting with chest pain.  He was previously seen in the ER on 6/4 with cough and was COVID +, given molnupiravir.  He reports having some chest discomfort.  Pain vs. Pressure.  Present for about 2 days.  Occasional twinge in his left arm.  Not worse with exertion, nothing makes it better.  Pain is substernal in nature and he does not think there was improvement with NTG.   Mild SOB.  He recently had COVID and no longer has symptoms, did not complete molnupiravir.      ER Course:  Medication non-compliance.  CP.  Negative troponin, EKG with mild changes.  CTA unremarkable.  Some improvement with NTG, given ASA.  D/w Dr. Margaretann Loveless, recommended [facility] admission.     Review of Systems: As mentioned in the history of present illness. All other systems reviewed and are negative. Past Medical History:  Diagnosis Date   Anxiety    CAD (coronary artery disease), native coronary artery    a. Nonobstructive CAD by cath 2013 - diffuse distal and branch vessel CAD, no severe disease in the major coronaries, LV mild global hypokinesis, EF 45%. b. ETT-Sestamibi 5/14: EF 31%, small fixed inferior defect with no ischemia.   Chronic CHF ([place])    a. Mixed ICM/NICM (?EtOH). EF 35% in 2008. Echo 5/13: EF 60-65%, mod LVH, EF 45% on V gram in 12/2011. EF 12/2012: EF 50-55%, mild LVH, inferobasal HK, mild MR. ETT-Ses 5/14 EF 41%. Cardiac MRI 5/14: EF 44%, mild global HK, subepicardial delayed  enhancement in nonspecific RV insertion pattern.   COLONIC POLYPS, HX OF 12/30/2006   Gout    H/O atrial flutter 08/13/2005   a. Ablations in 2007, 2008.   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   "left shoulder"   PAF (paroxysmal atrial fibrillation) ([place])    On coumadin   Sleep apnea    "suppose to send mask but they never did" (05/03/2015)   Type II diabetes mellitus ([place])    Past Surgical History:  Procedure Laterality Date   A FLUTTER ABLATION  2007, 2008   catheter ablation    CARDIAC CATHETERIZATION N/A 05/03/2015   Procedure: Right/Left Heart Cath and Coronary Angiography;  Surgeon: Larey Dresser, MD;  Location: [place] CV LAB;  Service: Cardiovascular;  Laterality: N/A;   CARDIAC CATHETERIZATION N/A 05/03/2015   Procedure: Coronary Stent Intervention;  Surgeon: Jettie Booze, MD;  Location: [place] CV LAB;  Service: Cardiovascular;  Laterality: N/A;   CARDIAC CATHETERIZATION N/A 11/04/2015   Procedure: Left Heart Cath and Coronary Angiography;  Surgeon: Larey Dresser, MD;  Location: [place] CV LAB;  Service: Cardiovascular;  Laterality: N/A;   CARDIOVERSION N/A 08/24/2015   Procedure: CARDIOVERSION;  Surgeon: Thayer Headings, MD;  Location: [facility] ENDOSCOPY;  Service: Cardiovascular;  Laterality: N/A;   CHOLECYSTECTOMY N/A 02/07/2016   Procedure: LAPAROSCOPIC CHOLECYSTECTOMY WITH  INTRAOPERATIVE CHOLANGIOGRAM;  Surgeon: Greer Pickerel, MD;  Location: [facility];  Service: General;  Laterality: N/A;   COLONOSCOPY WITH PROPOFOL N/A 04/28/2019   Procedure: COLONOSCOPY WITH PROPOFOL;  Surgeon: Ronnette Juniper, MD;  Location: [place];  Service:  Gastroenterology;  Laterality: N/A;   CORONARY ANGIOPLASTY  12/05/01   ESOPHAGOGASTRODUODENOSCOPY (EGD) WITH PROPOFOL N/A 04/27/2019   Procedure: ESOPHAGOGASTRODUODENOSCOPY (EGD) WITH PROPOFOL;  Surgeon: Ronnette Juniper, MD;  Location: [place];  Service: Gastroenterology;  Laterality: N/A;   GIVENS CAPSULE STUDY N/A 06/29/2019   Procedure: GIVENS CAPSULE STUDY;  Surgeon: Wilford Corner, MD;  Location: [place];  Service: Endoscopy;  Laterality: N/A;   LEFT HEART CATH AND CORONARY ANGIOGRAPHY N/A 12/28/2016   Procedure: Left Heart Cath and Coronary Angiography;  Surgeon: Larey Dresser, MD;  Location: [place] CV LAB;  Service: Cardiovascular;  Laterality: N/A;   LEFT HEART CATHETERIZATION WITH CORONARY ANGIOGRAM N/A 12/19/2011   Procedure: LEFT HEART CATHETERIZATION WITH CORONARY ANGIOGRAM;  Surgeon: Larey Dresser, MD;  [facility] CATH LAB;  Service: Cardiovascular;  Laterality: N/A;   OSTEOCHONDROMA EXCISION Left 1972   "took bone tumor off my shoulder"   POLYPECTOMY  04/28/2019   Procedure: POLYPECTOMY;  Surgeon: Ronnette Juniper, MD;  Location: [facility] ENDOSCOPY;  Service: Gastroenterology;;   PRESSURE SENSOR/CARDIOMEMS N/A 02/01/2020   Procedure: PRESSURE SENSOR/CARDIOMEMS;  Surgeon: Larey Dresser, MD;  Location: [place] CV LAB;  Service: Cardiovascular;  Laterality: N/A;   RIGHT HEART CATH N/A 02/01/2020   Procedure: RIGHT HEART CATH;  Surgeon: Larey Dresser, MD;  Location: [place] CV LAB;  Service: Cardiovascular;  Laterality: N/A;   RIGHT/LEFT HEART CATH AND CORONARY ANGIOGRAPHY N/A 07/01/2019   Procedure: RIGHT/LEFT HEART CATH AND CORONARY ANGIOGRAPHY;  Surgeon: Larey Dresser, MD;  Location: [place] CV LAB;  Service: Cardiovascular;  Laterality: N/A;   Social History:  reports that he quit smoking about 38 years ago. His smoking use included cigarettes. He has a 72.00 pack-year smoking history. He has never used smokeless tobacco. He reports current alcohol use of  about 16.0 standard drinks of alcohol per week. He reports current drug use. Drug: Marijuana.  Allergies  Allergen Reactions   Shrimp [Shellfish Allergy] Nausea And Vomiting   Ace Inhibitors Cough   Other Hives and Other (See Comments)    Patient reports developing hives after receiving "some antibiotic given in 1980''s at [facility]". He does not know which antibiotic.    Family History  Problem Relation Age of Onset   Diabetes Mother    Hypertension Mother    Heart attack Neg Hx    Stroke Neg Hx     Prior to Admission medications   Medication Sig Start Date End Date Taking? Authorizing Provider  acetaminophen (TYLENOL) 500 MG tablet Take 1,000 mg by mouth every 6 (six) hours as needed (pain).   Yes [provider]  albuterol (VENTOLIN HFA) 108 (90 Base) MCG/ACT inhaler Inhale 1-2 puffs into the lungs every 6 (six) hours as needed for wheezing or shortness of breath. 04/06/20  Yes Yu, Amy V, PA-C  apixaban (ELIQUIS) 5 MG TABS tablet Take 1 tablet (5 mg total) by mouth 2 (two) times daily. 05/24/21  Yes Milford, Maricela Bo, FNP  atorvastatin (LIPITOR) 80 MG tablet  Take 1 tablet by mouth once daily 12/13/21  Yes Milford, [place], FNP  carvedilol (COREG) 25 MG tablet TAKE 1 TABLET BY MOUTH TWICE DAILY WITH A MEAL 06/26/21  Yes Larey Dresser, MD  colchicine 0.6 MG tablet On day 1 take 2 tablets at first sign of gout flare.  Take 1 tablet 1 hour later.  Then take 1 tablet daily starting the next day until resolution of gout flare. Patient taking differently: Take 0.6 mg by mouth daily. On day 1 take 2 tablets at first sign of gout flare.  Take 1 tablet 1 hour later.  Then take 1 tablet daily starting the next day until resolution of gout flare as needed. 05/24/21  Yes Milford, Maricela Bo, FNP  empagliflozin (JARDIANCE) 10 MG TABS tablet Take 1 tablet (10 mg total) by mouth daily before breakfast. 03/21/21  Yes Milford, Jessica M, FNP  hydrALAZINE (APRESOLINE) 50 MG tablet  TAKE 1 & 1/2 (ONE & ONE-HALF) TABLETS BY MOUTH TWICE DAILY. MUST MAKE FOLLOW UP APPT FOR FURTHER REFILLS 02/01/21  Yes Larey Dresser, MD  insulin glargine (LANTUS SOLOSTAR) 100 UNIT/ML Solostar Pen Inject 40 Units into the skin every morning. And pen needles 1/day 02/27/21  Yes Renato Shin, MD  isosorbide mononitrate (IMDUR) 120 MG 24 hr tablet Take 1 tablet (120 mg total) by mouth daily. 05/24/21  Yes Milford, Maricela Bo, FNP  metFORMIN (GLUCOPHAGE) 1000 MG tablet Take 1,000 mg by mouth daily with breakfast. 01/15/22  Yes Elayne Snare, MD  nitroGLYCERIN (NITROSTAT) 0.4 MG SL tablet Place 1 tablet (0.4 mg total) under the tongue every 5 (five) minutes as needed for chest pain (x 3 tabs daily). 09/07/20  Yes Milford, Maricela Bo, FNP  sotalol (BETAPACE) 120 MG tablet Take 1 tablet (120 mg total) by mouth 2 (two) times daily. 03/21/21  Yes Milford, Maricela Bo, FNP  torsemide (DEMADEX) 20 MG tablet Take 80 mg by mouth 2 (two) times daily.   Yes [provider]  ACCU-CHEK SOFTCLIX LANCETS lancets Use as instructed for three times daily testing of blood glucose 09/06/16   Jegede, Olugbemiga E, MD  amLODipine (NORVASC) 10 MG tablet Take 1 tablet (10 mg total) by mouth daily. Patient not taking: Reported on 01/22/2022 06/21/21   Rafael Bihari, FNP  blood glucose meter kit and supplies KIT Dispense based on patient and insurance preference. Use up to four times daily as directed. (FOR ICD-9 250.00, 250.01). 07/20/20   [name], MD  Blood Glucose Monitoring Suppl (ACCU-CHEK AVIVA PLUS) w/Device KIT Use as directed for 3 times daily testing of blood glucose 09/06/16   Jegede, Olugbemiga E, MD  eplerenone (INSPRA) 25 MG tablet Take 1 tablet (25 mg total) by mouth daily. Patient not taking: Reported on 01/05/2022 05/24/21   Rafael Bihari, FNP  glucose blood (ACCU-CHEK AVIVA PLUS) test strip Use as instructed for 3 times daily testing of blood glucose 09/06/16   Jegede, Olugbemiga E, MD  Insulin Pen  Needle (PEN NEEDLES) 32G X 4 MM MISC 1 each by Does not apply route daily. 05/07/21   Renato Shin, MD  Lancet Devices [facility]) lancets Use as instructed for 3 times daily testing of blood glucose 09/06/16   Jegede, Marlena Clipper, MD  meclizine (ANTIVERT) 12.5 MG tablet Take 1 tablet (12.5 mg total) by mouth 3 (three) times daily as needed for dizziness. Patient not taking: Reported on 01/22/2022 04/30/21   Quintella Reichert, MD  TRUEPLUS LANCETS 28G MISC 1 each by  Does not apply route 3 (three) times daily. 08/22/16   Tresa Garter, MD    Physical Exam: Vitals:   01/22/22 0815 01/22/22 0845 01/22/22 0915 01/22/22 1015  BP: (!) 152/96 (!) 160/86 (!) 156/90 (!) 141/91  Pulse: 67 69  (!) 54  Resp: _0 Temp:      TempSrc:      SpO2: 95% 95% 97% 97%  Weight:      Height:       General:  Appears calm and comfortable and is in NAD Eyes:  PERRL, EOMI, normal lids, iris ENT:  grossly normal hearing, lips & tongue, mmm; edentulous Neck:  no LAD, masses or thyromegaly Cardiovascular:  RRR, no m/r/g. No LE edema.  Respiratory:   CTA bilaterally with no wheezes/rales/rhonchi.  Normal respiratory effort. Abdomen:  soft, NT, ND Skin:  no rash or induration seen on limited exam Musculoskeletal:  grossly normal tone BUE/BLE, good ROM, no bony abnormality Psychiatric:  grossly normal mood and affect, speech fluent and appropriate, AOx3 Neurologic:  CN 2-12 grossly intact, moves all extremities in coordinated fashion   Radiological Exams on Admission: Independently reviewed - see discussion in A/P where applicable  CT Angio Chest PE W and/or Wo Contrast  Result Date: 01/22/2022 CLINICAL DATA:  Concern for pulmonary embolus EXAM: CT ANGIOGRAPHY CHEST WITH CONTRAST TECHNIQUE: Multidetector CT imaging of the chest was performed using the standard protocol during bolus administration of intravenous contrast. Multiplanar CT image reconstructions and MIPs were obtained to evaluate  the vascular anatomy. RADIATION DOSE REDUCTION: This exam was performed according to the departmental dose-optimization program which includes automated exposure control, adjustment of the mA and/or kV according to patient size and/or use of iterative reconstruction technique. CONTRAST:  114m OMNIPAQUE IOHEXOL 350 MG/ML SOLN COMPARISON:  None Available. FINDINGS: Cardiovascular: Satisfactory opacification of the pulmonary arteries to the segmental level. No evidence of pulmonary embolism. Cardiomegaly. Pericardial effusion. Atherosclerotic disease of the thoracic aorta. CardioMems device noted in a left lower lobe pulmonary artery. Mediastinum/Nodes: Small hiatal hernia and patulous esophagus. Thyroid is unremarkable. Mildly enlarged mediastinal and right hilar lymph nodes, likely reactive. Reference AP window lymph node measuring 1.1 cm in short axis on series 5, image 52. Reference right hilar lymph node measuring 1 point cm in short axis on series 5, image 71. Lungs/Pleura: Small amount of debris seen in the right mainstem bronchus. Bronchial wall thickening and smooth interlobular septal thickening seen in the lower lungs, likely due to pulmonary edema. No consolidation, pleural effusion, or pneumothorax. Solid pulmonary nodule of the right middle lobe measuring 4 mm on series 7, image 163. Upper Abdomen: Cholecystectomy clips.  No acute abnormality. Musculoskeletal: No chest wall abnormality. No acute or significant osseous findings. Review of the MIP images confirms the above findings. IMPRESSION: 1. No evidence of acute pulmonary embolus. 2. Cardiomegaly and mild pulmonary edema. 3. Solid 4 mm pulmonary nodule of the right middle lobe. No follow-up needed if patient is low-risk.This recommendation follows the consensus statement: Guidelines for Management of Incidental Pulmonary Nodules Detected on CT Images: From the Fleischner Society 2017; Radiology 2017; 284:228-243. 4. Aortic Atherosclerosis  (ICD10-I70.0). Electronically Signed   By: LYetta GlassmanM.D.   On: 01/22/2022 08:45   DG Chest 2 View  Result Date: 01/21/2022 CLINICAL DATA:  Chest pain. EXAM: CHEST - 2 VIEW COMPARISON:  April 06, 2020 FINDINGS: The heart size and mediastinal contours are within normal limits. There is no evidence of acute infiltrate, pleural effusion or  pneumothorax. Radiopaque surgical clips are seen within the right upper quadrant. The visualized skeletal structures are unremarkable. IMPRESSION: No acute cardiopulmonary disease. Electronically Signed   By: Virgina Norfolk M.D.   On: 01/21/2022 18:05    EKG: Independently reviewed.  NSR with rate 74; nonspecific ST changes with diffuse T wave inversion/depression   Labs on Admission: I have personally reviewed the available labs and imaging studies at the time of the admission.  Pertinent labs:    Glucose 323 HS troponin 18, 16 BNP 118.4 Normal CBC   Assessment and Plan: Principal Problem:   Chest pain of uncertain etiology Active Problems:   HYPERCHOLESTEROLEMIA   Persistent atrial fibrillation (HCC)   Obstructive sleep apnea   Essential hypertension   Type 2 diabetes mellitus without complication (HCC)    Chest pain -Patient with substernal chest pressure that has been present for 2 days, not exertional and not improved with NTG -1/3 typical symptoms suggestive of noncardiac chest pain.  -Has prior h/o CAD with stents, last cath in 2020 with severe multivessel disease, medical management -CXR unremarkable.   -Initial cardiac HS troponin negative x 2.  -EKG not indicative of STEMI but somewhat concerning for ischemia.   -Will plan to place in observation status on telemetry to rule out ACS by overnight observation.  -Cardiology consultation -Continue Imdur   HTN -Patient has not been taking amlodipine -Continue carvedilol, hold sotalol as he likely does not require 2 simultaneous beta blockers -Continue PO hydralazine and will  also add prn IV hydralazine  HLD -Continue Lipitor -Lipids were checked in January (TC 115, HDL 45, LDL 50, TG 98) so will not repeat at this time  DM -Last A1c was 8.7 in September; will repeat -Continue Lantus -Will cover with moderate-scale SSI for now  Chronic combined CHF -Has had periodic systolic CHF although most recent echo I 12/2019 showed EF 55% and normal diastolic function -Appears compensated at this time clinically, although CTA with mild edema -Has was supposed to restart eplerenone after 5/26 cardiology visit but is not taking due to patient preference -Will continue torsemide oral for now given no clinical evidence of volume overload, unremarkable BNP -Hold Jardiance for now -Continue Coreg  Afib -s/p  ablations -He has been on both carvedilol and sotalol -Will hold sotalol, continue carvedilol for now -Continue Eliquis  OSA -Noncompliant with CPAP -Will order CPAP while here  Recent COVID infection -Patient was + on 6/3 -Reports no current symptoms although he did have mild cough while I was in the room -Took but did not complete course of molnupiravir -Given general improvement in symptoms, isolation does not appear to be indicated at this time   Advance Care Planning:   Code Status: Full Code   Consults: Cardiology  DVT Prophylaxis: Eliquis  Family Communication: None present; he is capable of communicating with his family at this time  Severity of Illness: The appropriate patient status for this patient is OBSERVATION. Observation status is judged to be reasonable and necessary in order to provide the required intensity of service to ensure the patient's safety. The patient's presenting symptoms, physical exam findings, and initial radiographic and laboratory data in the context of their medical condition is felt to place them at decreased risk for further clinical deterioration. Furthermore, it is anticipated that the patient will be medically stable  for discharge from the hospital within 2 midnights of admission.   Author: Karmen Bongo, MD 01/22/2022 11:31 AM  For on call review [email or web address].

## 2022-01-22 NOTE — ED Notes (Signed)
Patient transported to CT 

## 2022-01-23 ENCOUNTER — Observation Stay (HOSPITAL_BASED_OUTPATIENT_CLINIC_OR_DEPARTMENT_OTHER): Payer: Medicare Other

## 2022-01-23 DIAGNOSIS — I441 Atrioventricular block, second degree: Secondary | ICD-10-CM

## 2022-01-23 DIAGNOSIS — R079 Chest pain, unspecified: Secondary | ICD-10-CM | POA: Diagnosis not present

## 2022-01-23 LAB — GLUCOSE, CAPILLARY
Glucose-Capillary: 208 mg/dL — ABNORMAL HIGH (ref 70–99)
Glucose-Capillary: 263 mg/dL — ABNORMAL HIGH (ref 70–99)

## 2022-01-23 LAB — ECHOCARDIOGRAM COMPLETE
AV Vena cont: 0.3 cm
Area-P 1/2: 4.8 cm2
Calc EF: 52 %
Height: 72 in
P 1/2 time: 476 msec
S' Lateral: 3.2 cm
Single Plane A2C EF: 52.5 %
Single Plane A4C EF: 47.7 %
Weight: 3193.6 oz

## 2022-01-23 LAB — HIV ANTIBODY (ROUTINE TESTING W REFLEX): HIV Screen 4th Generation wRfx: NONREACTIVE

## 2022-01-23 MED ORDER — CARVEDILOL 12.5 MG PO TABS: 12.5000 mg | ORAL_TABLET | Freq: Two times a day (BID) | ORAL | Status: DC

## 2022-01-23 MED ORDER — FREESTYLE LIBRE 2 READER DEVI: 0 refills | Status: DC

## 2022-01-23 MED ORDER — FREESTYLE LIBRE 2 SENSOR MISC
2 refills | Status: DC
Start: 2022-01-23 — End: 2022-06-18

## 2022-01-23 MED ORDER — CARVEDILOL 12.5 MG PO TABS
12.5000 mg | ORAL_TABLET | Freq: Two times a day (BID) | ORAL | 2 refills | Status: DC
Start: 2022-01-23 — End: 2022-11-06

## 2022-01-23 NOTE — Discharge Instructions (Addendum)
[place] were in the hospital with chest pain.  The reason is not certain, but thankfully it does not appear you have a heart attack, pulmonary embolism (blood clot in your lung blood vessels) pneumonia or collapsed lung. The cardiologist has recommended some changes to your medications with recommendations for outpatient follow-up. Incidentally, you were found to have a pulmonary nodule and will need repeat imaging; your PCP can arrange this as recommended.

## 2022-01-23 NOTE — Discharge Summary (Signed)
Physician Discharge Summary   Patient: [name] [name] MRN: [id number] DOB: 01/18/1956  Admit date:     01/21/2022  Discharge date: 01/23/22  Discharge Physician: Cordelia Poche, MD   PCP: [name], MD   Recommendations at discharge:  Outpatient PCP follow-up in 1 week for hospital follow-up Patient will need monitoring for solitary pulmonary nodule Improved hyperglycemic control Outpatient cardiology follow-up  Discharge Diagnoses: Principal Problem:   Chest pain of uncertain etiology Active Problems:   HYPERCHOLESTEROLEMIA   Persistent atrial fibrillation (HCC)   Obstructive sleep apnea   Essential hypertension   Type 2 diabetes mellitus without complication ([facility])  Resolved Problems:   * No resolved hospital problems. *  Hospital Course: [name] [name] is a 66 y.o. 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  Patient presented secondary to chest pain.  Some improvement with nitroglycerin with associated shortness of breath.  EKG with ischemic changes.  Troponin negative x2.  Cardiology consulted on admission.  Echo obtained and was unremarkable for acute LVEF dysfunction or regional wall motion abnormalities.  Patient's chest pain resolved prior to discharge. Cardiology recommendation for outpatient follow-up.  Assessment and Plan:  Chest pain Substernal chest pain which was nonexertional and improved slightly with nitroglycerin.  Cardiology consulted.  Work-up negative for ischemic cause.  Chest pain resolved.  No acute LVEF changes or regional wall motion abnormalities.  Cardiology recommendation for outpatient follow-up with cardiology.  Primary pretension Recommendations to discontinue amlodipine on discharge.  Continue Coreg at reduced dose of 12.5 mg twice daily,  eplerenone, Imdur, and hydralazine.  Mobitz type I second-degree heart block In setting of Coreg and sotalol.  Cardiology recommendation to decrease to Coreg 12.5 mg p.o. twice daily.  Patient asymptomatic.  Patient to follow-up with cardiology as outpatient.  Hyperlipidemia Continue Lipitor  Diabetes mellitus, type II Hemoglobin A1c of 15% secondary to patient nonadherence.  Diabetic coordinator was consulted for continued education.  Freestyle glucose monitor was provided on discharge.  Patient to follow-up with his primary care physician for close monitoring and management of his blood sugar/diabetes mellitus.  Chronic combined systolic and diastolic heart failure Stable.  Echocardiogram obtained this admission with continued improved EF of 55 to 60% with no regional wall motion abnormalities.  Obstructive sleep apnea Patient has not been adherent with CPAP at home.  CPAP provided while admitted.  History of COVID infection Noted.  Patient is out of window for isolation.  Solitary pulmonary nodule Solid 4 mm pulmonary nodule of the right middle lobe. Patient is higher risk for lung cancer so recommendation for repeat CT imaging in 6-12 months.      Consultants: Cardiology Procedures performed: Echocardiogram (6/13) Disposition: Home Diet recommendation: Cardiac and Carb modified diet  DISCHARGE MEDICATION: Allergies as of 01/23/2022       Reactions   Shrimp [shellfish Allergy] Nausea And Vomiting   Ace Inhibitors Cough   Other Hives, Other (See Comments)   Patient reports developing hives after receiving "some antibiotic given in 1980''s at [facility]". He does not know which antibiotic.        Medication List     STOP taking these medications    amLODipine 10 MG tablet Commonly known as: NORVASC       TAKE these medications    Accu-Chek Aviva Plus w/Device Kit Use as directed for 3 times daily testing of  blood glucose   accu-chek softclix  lancets Use as instructed for 3 times daily testing of blood glucose   acetaminophen 500 MG tablet Commonly known as: TYLENOL Take 1,000 mg by mouth every 6 (six) hours as needed (pain).   albuterol 108 (90 Base) MCG/ACT inhaler Commonly known as: VENTOLIN HFA Inhale 1-2 puffs into the lungs every 6 (six) hours as needed for wheezing or shortness of breath.   apixaban 5 MG Tabs tablet Commonly known as: ELIQUIS Take 1 tablet (5 mg total) by mouth 2 (two) times daily.   atorvastatin 80 MG tablet Commonly known as: LIPITOR Take 1 tablet by mouth once daily   blood glucose meter kit and supplies Kit Dispense based on patient and insurance preference. Use up to four times daily as directed. (FOR ICD-9 250.00, 250.01).   carvedilol 12.5 MG tablet Commonly known as: COREG Take 1 tablet (12.5 mg total) by mouth 2 (two) times daily with a meal. What changed:  medication strength how much to take   colchicine 0.6 MG tablet On day 1 take 2 tablets at first sign of gout flare.  Take 1 tablet 1 hour later.  Then take 1 tablet daily starting the next day until resolution of gout flare. What changed:  how much to take how to take this when to take this additional instructions   empagliflozin 10 MG Tabs tablet Commonly known as: Jardiance Take 1 tablet (10 mg total) by mouth daily before breakfast.   eplerenone 25 MG tablet Commonly known as: INSPRA Take 1 tablet (25 mg total) by mouth daily.   FreeStyle [place] 2 Reader [facility] Check your blood sugar three times per day before meals and once before bedtime   FreeStyle Libre 2 Sensor Misc Apply one sensor to upper arm every 2 weeks   glucose blood test strip Commonly known as: Accu-Chek Aviva Plus Use as instructed for 3 times daily testing of blood glucose   hydrALAZINE 50 MG tablet Commonly known as: APRESOLINE TAKE 1 & 1/2 (ONE & ONE-HALF) TABLETS BY MOUTH TWICE DAILY. MUST MAKE FOLLOW UP APPT FOR FURTHER REFILLS    isosorbide mononitrate 120 MG 24 hr tablet Commonly known as: IMDUR Take 1 tablet (120 mg total) by mouth daily.   Lantus SoloStar 100 UNIT/ML Solostar Pen Generic drug: insulin glargine Inject 40 Units into the skin every morning. And pen needles 1/day   metFORMIN 1000 MG tablet Commonly known as: GLUCOPHAGE Take 1,000 mg by mouth daily with breakfast.   nitroGLYCERIN 0.4 MG SL tablet Commonly known as: NITROSTAT Place 1 tablet (0.4 mg total) under the tongue every 5 (five) minutes as needed for chest pain (x 3 tabs daily).   Pen Needles 32G X 4 MM Misc 1 each by Does not apply route daily.   sotalol 120 MG tablet Commonly known as: BETAPACE Take 1 tablet (120 mg total) by mouth 2 (two) times daily.   torsemide 20 MG tablet Commonly known as: DEMADEX Take 80 mg by mouth 2 (two) times daily.   TRUEplus Lancets 28G Misc 1 each by Does not apply route 3 (three) times daily.   Accu-Chek Softclix Lancets lancets Use as instructed for three times daily testing of blood glucose        Discharge Exam: BP (!) 147/83 (BP Location: Left Arm)   Pulse 67   Temp 98.5 F (36.9 C) (Oral)   Resp 17   Ht 6' (1.829 m)   Wt 90.5 kg   SpO2 99%  BMI 27.07 kg/m   General exam: Appears calm and comfortable Respiratory system: Clear to auscultation. Respiratory effort normal. Cardiovascular system: S1 & S2 heard, RRR. No murmurs, rubs, gallops or clicks. Gastrointestinal system: Abdomen is distended, soft and nontender. Normal bowel sounds heard. Central nervous system: Alert and oriented. No focal neurological deficits. Musculoskeletal: No calf tenderness Skin: No cyanosis. No rashes Psychiatry: Judgement and insight appear normal. Mood & affect appropriate.   Condition at discharge: stable  The results of significant diagnostics from this hospitalization (including imaging, microbiology, ancillary and laboratory) are listed below for reference.   Imaging Studies: CT Angio  Chest PE W and/or Wo Contrast  Result Date: 01/22/2022 CLINICAL DATA:  Concern for pulmonary embolus EXAM: CT ANGIOGRAPHY CHEST WITH CONTRAST TECHNIQUE: Multidetector CT imaging of the chest was performed using the standard protocol during bolus administration of intravenous contrast. Multiplanar CT image reconstructions and MIPs were obtained to evaluate the vascular anatomy. RADIATION DOSE REDUCTION: This exam was performed according to the departmental dose-optimization program which includes automated exposure control, adjustment of the mA and/or kV according to patient size and/or use of iterative reconstruction technique. CONTRAST:  177m OMNIPAQUE IOHEXOL 350 MG/ML SOLN COMPARISON:  None Available. FINDINGS: Cardiovascular: Satisfactory opacification of the pulmonary arteries to the segmental level. No evidence of pulmonary embolism. Cardiomegaly. Pericardial effusion. Atherosclerotic disease of the thoracic aorta. CardioMems device noted in a left lower lobe pulmonary artery. Mediastinum/Nodes: Small hiatal hernia and patulous esophagus. Thyroid is unremarkable. Mildly enlarged mediastinal and right hilar lymph nodes, likely reactive. Reference AP window lymph node measuring 1.1 cm in short axis on series 5, image 52. Reference right hilar lymph node measuring 1 point cm in short axis on series 5, image 71. Lungs/Pleura: Small amount of debris seen in the right mainstem bronchus. Bronchial wall thickening and smooth interlobular septal thickening seen in the lower lungs, likely due to pulmonary edema. No consolidation, pleural effusion, or pneumothorax. Solid pulmonary nodule of the right middle lobe measuring 4 mm on series 7, image 163. Upper Abdomen: Cholecystectomy clips.  No acute abnormality. Musculoskeletal: No chest wall abnormality. No acute or significant osseous findings. Review of the MIP images confirms the above findings. IMPRESSION: 1. No evidence of acute pulmonary embolus. 2. Cardiomegaly  and mild pulmonary edema. 3. Solid 4 mm pulmonary nodule of the right middle lobe. No follow-up needed if patient is low-risk.This recommendation follows the consensus statement: Guidelines for Management of Incidental Pulmonary Nodules Detected on CT Images: From the Fleischner Society 2017; Radiology 2017; 284:228-243. 4. Aortic Atherosclerosis (ICD10-I70.0). Electronically Signed   By: LYetta GlassmanM.D.   On: 01/22/2022 08:45   DG Chest 2 View  Result Date: 01/21/2022 CLINICAL DATA:  Chest pain. EXAM: CHEST - 2 VIEW COMPARISON:  April 06, 2020 FINDINGS: The heart size and mediastinal contours are within normal limits. There is no evidence of acute infiltrate, pleural effusion or pneumothorax. Radiopaque surgical clips are seen within the right upper quadrant. The visualized skeletal structures are unremarkable. IMPRESSION: No acute cardiopulmonary disease. Electronically Signed   By: TVirgina NorfolkM.D.   On: 01/21/2022 18:05    Microbiology: Results for orders placed or performed during the hospital encounter of 01/14/22  SARS Coronavirus 2 by RT PCR (hospital order, [facility]) *cepheid single result test* Anterior Nasal Swab     Status: Abnormal   Collection Time: 01/13/22  9:41 PM   Specimen: Anterior Nasal Swab  Result Value Ref Range Status   SARS Coronavirus  2 by RT PCR POSITIVE (A) NEGATIVE Final    Comment: (NOTE) SARS-CoV-2 target nucleic acids are DETECTED  SARS-CoV-2 RNA is generally detectable in upper respiratory specimens  during the acute phase of infection.  Positive results are indicative  of the presence of the identified virus, but do not rule out bacterial infection or co-infection with other pathogens not detected by the test.  Clinical correlation with patient history and  other diagnostic information is necessary to determine patient infection status.  The expected result is negative.  Fact Sheet for Patients:    https://www.patel.info/   Fact Sheet for Healthcare Providers:   https://hall.com/    This test is not yet approved or cleared by the [place] FDA and  has been authorized for detection and/or diagnosis of SARS-CoV-2 by FDA under an Emergency Use Authorization (EUA).  This EUA will remain in effect (meaning this test can be used) for the duration of  the COVID-19 declaration under Section 564(b)(1)  of the Act, 21 U.S.C. section 360-bbb-3(b)(1), unless the authorization is terminated or revoked sooner.   Performed at [facility], [address] [place], [place] [address]     Labs: CBC: Recent Labs  Lab 01/21/22 1723  WBC 5.3  HGB 15.8  HCT 49.3  MCV 87.4  PLT 882   Basic Metabolic Panel: Recent Labs  Lab 01/21/22 1723  NA 138  K 4.1  CL 109  CO2 20*  GLUCOSE 323*  BUN 10  CREATININE 1.24  CALCIUM 8.9    CBG: Recent Labs  Lab 01/22/22 1140 01/22/22 1721 01/22/22 2118 01/23/22 0634  GLUCAP 191* 237* 168* 208*    Discharge time spent: 35 minutes.  Signed: Cordelia Poche, MD [facility] 01/23/2022

## 2022-01-23 NOTE — Progress Notes (Incomplete)
PROGRESS NOTE    [name] [name]  ZDG:[id number] DOB: [birth year] DOA: 01/21/2022 PCP: [name], MD   Brief Narrative: No notes on file   Assessment and Plan: No notes have been filed under this hospital service. Service: Hospitalist          DVT prophylaxis: *** Code Status:   Code Status: Full Code Family Communication: *** Disposition Plan: ***   Consultants:  ***  Procedures:  ***  Antimicrobials: ***    Subjective: ***  Objective: BP (!) 147/83 (BP Location: Left Arm)   Pulse 67   Temp 98.5 F (36.9 C) (Oral)   Resp 17   Ht 6' (1.829 m)   Wt 90.5 kg   SpO2 99%   BMI 27.07 kg/m   Examination:  General exam: Appears calm and comfortable *** Respiratory system: Clear to auscultation. Respiratory effort normal. Cardiovascular system: S1 & S2 heard, RRR. No murmurs, rubs, gallops or clicks. Gastrointestinal system: Abdomen is nondistended, soft and nontender. No organomegaly or masses felt. Normal bowel sounds heard. Central nervous system: Alert and oriented. No focal neurological deficits. Musculoskeletal: No edema. No calf tenderness Skin: No cyanosis. No rashes Psychiatry: Judgement and insight appear normal. Mood & affect appropriate.    Data Reviewed: I have personally reviewed following labs and imaging studies  CBC Lab Results  Component Value Date   WBC 5.3 01/21/2022   RBC 5.64 01/21/2022   HGB 15.8 01/21/2022   HCT 49.3 01/21/2022   MCV 87.4 01/21/2022   MCH 28.0 01/21/2022   PLT 185 01/21/2022   MCHC 32.0 01/21/2022   RDW 14.1 01/21/2022   LYMPHSABS 1.8 01/13/2022   MONOABS 1.7 (H) 01/13/2022   EOSABS 0.0 01/13/2022   BASOSABS 0.1 59/56/3875     Last metabolic panel Lab Results  Component Value Date   NA 138 01/21/2022   K 4.1 01/21/2022   CL 109 01/21/2022   CO2 20 (L) 01/21/2022   BUN 10 01/21/2022   CREATININE 1.24 01/21/2022   GLUCOSE 323 (H) 01/21/2022   GFRNONAA >60 01/21/2022   GFRAA 64  07/20/2020   CALCIUM 8.9 01/21/2022   PHOS 2.6 07/31/2013   PROT 7.8 01/13/2022   ALBUMIN 3.6 01/13/2022   LABGLOB 3.2 04/08/2018   AGRATIO 1.2 04/08/2018   BILITOT 0.8 01/13/2022   ALKPHOS 69 01/13/2022   AST 25 01/13/2022   ALT 22 01/13/2022   ANIONGAP 9 01/21/2022    GFR: Estimated Creatinine Clearance: 63.4 mL/min (by C-G formula based on SCr of 1.24 mg/dL).  Recent Results (from the past 240 hour(s))  SARS Coronavirus 2 by RT PCR (hospital order, performed in [facility] hospital lab) *cepheid single result test* Anterior Nasal Swab     Status: Abnormal   Collection Time: 01/13/22  9:41 PM   Specimen: Anterior Nasal Swab  Result Value Ref Range Status   SARS Coronavirus 2 by RT PCR POSITIVE (A) NEGATIVE Final    Comment: (NOTE) SARS-CoV-2 target nucleic acids are DETECTED  SARS-CoV-2 RNA is generally detectable in upper respiratory specimens  during the acute phase of infection.  Positive results are indicative  of the presence of the identified virus, but do not rule out bacterial infection or co-infection with other pathogens not detected by the test.  Clinical correlation with patient history and  other diagnostic information is necessary to determine patient infection status.  The expected result is negative.  Fact Sheet for Patients:   https://www.patel.info/   Fact Sheet for Healthcare Providers:  https://hall.com/    This test is not yet approved or cleared by the [place] and  has been authorized for detection and/or diagnosis of SARS-CoV-2 by FDA under an Emergency Use Authorization (EUA).  This EUA will remain in effect (meaning this test can be used) for the duration of  the COVID-19 declaration under Section 564(b)(1)  of the Act, 21 U.S.C. section 360-bbb-3(b)(1), unless the authorization is terminated or revoked sooner.   Performed at [facility], [address] [place], [place]  [address]       Radiology Studies: CT Angio Chest PE W and/or Wo Contrast  Result Date: 01/22/2022 CLINICAL DATA:  Concern for pulmonary embolus EXAM: CT ANGIOGRAPHY CHEST WITH CONTRAST TECHNIQUE: Multidetector CT imaging of the chest was performed using the standard protocol during bolus administration of intravenous contrast. Multiplanar CT image reconstructions and MIPs were obtained to evaluate the vascular anatomy. RADIATION DOSE REDUCTION: This exam was performed according to the departmental dose-optimization program which includes automated exposure control, adjustment of the mA and/or kV according to patient size and/or use of iterative reconstruction technique. CONTRAST:  115m OMNIPAQUE IOHEXOL 350 MG/ML SOLN COMPARISON:  None Available. FINDINGS: Cardiovascular: Satisfactory opacification of the pulmonary arteries to the segmental level. No evidence of pulmonary embolism. Cardiomegaly. Pericardial effusion. Atherosclerotic disease of the thoracic aorta. CardioMems device noted in a left lower lobe pulmonary artery. Mediastinum/Nodes: Small hiatal hernia and patulous esophagus. Thyroid is unremarkable. Mildly enlarged mediastinal and right hilar lymph nodes, likely reactive. Reference AP window lymph node measuring 1.1 cm in short axis on series 5, image 52. Reference right hilar lymph node measuring 1 point cm in short axis on series 5, image 71. Lungs/Pleura: Small amount of debris seen in the right mainstem bronchus. Bronchial wall thickening and smooth interlobular septal thickening seen in the lower lungs, likely due to pulmonary edema. No consolidation, pleural effusion, or pneumothorax. Solid pulmonary nodule of the right middle lobe measuring 4 mm on series 7, image 163. Upper Abdomen: Cholecystectomy clips.  No acute abnormality. Musculoskeletal: No chest wall abnormality. No acute or significant osseous findings. Review of the MIP images confirms the above findings. IMPRESSION: 1. No  evidence of acute pulmonary embolus. 2. Cardiomegaly and mild pulmonary edema. 3. Solid 4 mm pulmonary nodule of the right middle lobe. No follow-up needed if patient is low-risk.This recommendation follows the consensus statement: Guidelines for Management of Incidental Pulmonary Nodules Detected on CT Images: From the Fleischner Society 2017; Radiology 2017; 284:228-243. 4. Aortic Atherosclerosis (ICD10-I70.0). Electronically Signed   By: LYetta GlassmanM.D.   On: 01/22/2022 08:45   DG Chest 2 View  Result Date: 01/21/2022 CLINICAL DATA:  Chest pain. EXAM: CHEST - 2 VIEW COMPARISON:  April 06, 2020 FINDINGS: The heart size and mediastinal contours are within normal limits. There is no evidence of acute infiltrate, pleural effusion or pneumothorax. Radiopaque surgical clips are seen within the right upper quadrant. The visualized skeletal structures are unremarkable. IMPRESSION: No acute cardiopulmonary disease. Electronically Signed   By: TVirgina NorfolkM.D.   On: 01/21/2022 18:05      LOS: 0 days    RCordelia Poche MD [facility] 01/23/2022, 9:37 AM   If 7PM-7AM, please contact night-coverage [email or web address]

## 2022-01-23 NOTE — Inpatient Diabetes Management (Signed)
Inpatient Diabetes Program Recommendations  AACE/ADA: New Consensus Statement on Inpatient Glycemic Control (2015)  Target Ranges:  Prepandial:   less than 140 mg/dL      Peak postprandial:   less than 180 mg/dL (1-2 hours)      Critically ill patients:  140 - 180 mg/dL   Lab Results  Component Value Date   GLUCAP 263 (H) 01/23/2022   HGBA1C 15.0 (H) 01/22/2022    Review of Glycemic Control  Diabetes history: DM2 Outpatient Diabetes medications: Lantus 40 units daily, Jardiance 10 mg daily, Metformin 1000 mg BID  Current orders for Inpatient glycemic control: Semglee 40 units daily, Novolog MOD TID & HS scale, Novolog 4 units TID  Inpatient Diabetes Program Recommendations:   Discussed A1c of 15.0 (average blood glucose 384 over the past 2-3 months) with patient @ bedside. Patient states he has missed several doses of insulin but understands the need to take regularly. Reviewed risks of elevated blood glucose and patient states understanding.  MD ordered application of Freestyle CGM at discharge for patient. Education done regarding application and changing CGM sensor (alternate every 14 days on back of arms), 1 hour warm-up, use of glucometer when alert displays, how to scan CGM for glucose reading and information for PCP. Patient has also been given educational packet regarding use CGM sensor including the 1-800 toll free number for any questions, problems or needs related to the [facility] sensors or reader.    Sensor applied by patient to (L) Arm at 100pm.  Explained that glucose readings will not be available until 1 hour after application. Reviewed use of CGM including how to scan, changing Sensor, Vitamin C warning, arrows with glucose readings, and Freestyle app. Gave patient Freestyle reader as well due to the app not being compatible with his phone.  Patient very appreciative.   Thank you, Nani Gasser. Aaminah Forrester, RN, MSN, CDE  Diabetes Coordinator Inpatient Glycemic Control  Team Team Pager [phone] (8am-5pm) 01/23/2022 1:43 PM

## 2022-01-23 NOTE — Progress Notes (Signed)
Pt transferred from ED to 4E20. Alert and fully oriented x 4. Denied chest pain at arrival. Hemodynamics stable. NSR with PACs on the monitor. Ambulated independently in room. No acute distress noted.   CCMD was called with 2nd verified. CHG bath was given per protocol. Call bell within reaching. Pt oriented to room and equipments.  Pt stated having concern of currently pain and burning when urinate on the tip of his penis. He has not circumcised. He requested MD to evaluate at am. Pt hesitated and refused for RN to assess his penis. Pt reported having recurrent symptom for months and feeling discomfort to discuss with his primary care provider. Pt denied sexually active after his wife passed away almost ten years ago. He denied abnormal penile discharge, frequent urination, dripping,urinary incontinent or any other urinary symptoms. We will monitor.  Kennyth Lose, RN

## 2022-01-23 NOTE — Progress Notes (Signed)
Dr. Gasper Sells reached out to request pt's appt be moved from 6/21 to after 6/27 per patient request. H. Schub with heart failure team was able to help move it to 6/28. I updated AVS and notified pt by phone to room.

## 2022-01-23 NOTE — Progress Notes (Signed)
Order received to discharge patient.  Telemetry monitor removed and CCMD notified.  PIV access removed.  Discharge instructions, follow up, medications and instructions for their use discussed with patient. 

## 2022-01-23 NOTE — Progress Notes (Addendum)
Progress Note  Patient Name: [name] [name] Date of Encounter: 01/23/2022  Primary Cardiologist: Loralie Champagne, MD   Subjective   Overnight No events. Patient notes that he is doing well No CP, SOB, Palpitations.  Inpatient Medications    Scheduled Meds:  apixaban  5 mg Oral BID   atorvastatin  80 mg Oral Daily   carvedilol  25 mg Oral BID WC   eplerenone  25 mg Oral Daily   hydrALAZINE  75 mg Oral BID   insulin aspart  0-15 Units Subcutaneous TID WC   insulin aspart  0-5 Units Subcutaneous QHS   insulin aspart  4 Units Subcutaneous TID WC   insulin glargine-yfgn  40 Units Subcutaneous Daily   isosorbide mononitrate  120 mg Oral Daily   sotalol  120 mg Oral Q12H   torsemide  80 mg Oral BID   Continuous Infusions:  PRN Meds: acetaminophen, albuterol, ondansetron (ZOFRAN) IV   Vital Signs    Vitals:   01/22/22 2333 01/22/22 2356 01/23/22 0441 01/23/22 0828  BP: (!) 98/49 (!) 98/49 106/69 (!) 147/83  Pulse: 64 72 60 67  Resp: '14 16 16 17  '$ Temp: 98 F (36.7 C)  98.5 F (36.9 C) 98.5 F (36.9 C)  TempSrc: Oral  Oral Oral  SpO2: 97% 98% 97% 99%  Weight:      Height:        Intake/Output Summary (Last 24 hours) at 01/23/2022 0947 Last data filed at 01/23/2022 0546 Gross per 24 hour  Intake 500 ml  Output 2720 ml  Net -2220 ml   Filed Weights   01/21/22 1720 01/22/22 0739 01/22/22 2100  Weight: 93.9 kg 93.9 kg 90.5 kg    Telemetry    SB with [place], to SR with 1st HB - Personally Reviewed  Physical Exam   Gen: no distress Neck: No JVD  Cardiac: No Rubs or Gallops, no murmur, RRR +2 radial pulses Respiratory: Clear to auscultation bilaterally, normal effort, normal  respiratory rate GI: Soft, nontender, non-distended  MS: No edema;  moves all extremities Integument: Skin feels warm Neuro:  At time of evaluation, alert and oriented to person/place/time/situation  Psych: Normal affect, patient feels better   Labs     Chemistry Recent Labs  Lab 01/21/22 1723  NA 138  K 4.1  CL 109  CO2 20*  GLUCOSE 323*  BUN 10  CREATININE 1.24  CALCIUM 8.9  GFRNONAA >60  ANIONGAP 9     Hematology Recent Labs  Lab 01/21/22 1723  WBC 5.3  RBC 5.64  HGB 15.8  HCT 49.3  MCV 87.4  MCH 28.0  MCHC 32.0  RDW 14.1  PLT 185    Cardiac EnzymesNo results for input(s): "TROPONINI" in the last 168 hours. No results for input(s): "TROPIPOC" in the last 168 hours.   BNP Recent Labs  Lab 01/22/22 0915  BNP 118.4*     DDimer No results for input(s): "DDIMER" in the last 168 hours.   Radiology    CT Angio Chest PE W and/or Wo Contrast  Result Date: 01/22/2022 CLINICAL DATA:  Concern for pulmonary embolus EXAM: CT ANGIOGRAPHY CHEST WITH CONTRAST TECHNIQUE: Multidetector CT imaging of the chest was performed using the standard protocol during bolus administration of intravenous contrast. Multiplanar CT image reconstructions and MIPs were obtained to evaluate the vascular anatomy. RADIATION DOSE REDUCTION: This exam was performed according to the departmental dose-optimization program which includes automated exposure control, adjustment of the mA and/or kV according  to patient size and/or use of iterative reconstruction technique. CONTRAST:  115m OMNIPAQUE IOHEXOL 350 MG/ML SOLN COMPARISON:  None Available. FINDINGS: Cardiovascular: Satisfactory opacification of the pulmonary arteries to the segmental level. No evidence of pulmonary embolism. Cardiomegaly. Pericardial effusion. Atherosclerotic disease of the thoracic aorta. CardioMems device noted in a left lower lobe pulmonary artery. Mediastinum/Nodes: Small hiatal hernia and patulous esophagus. Thyroid is unremarkable. Mildly enlarged mediastinal and right hilar lymph nodes, likely reactive. Reference AP window lymph node measuring 1.1 cm in short axis on series 5, image 52. Reference right hilar lymph node measuring 1 point cm in short axis on series 5, image 71.  Lungs/Pleura: Small amount of debris seen in the right mainstem bronchus. Bronchial wall thickening and smooth interlobular septal thickening seen in the lower lungs, likely due to pulmonary edema. No consolidation, pleural effusion, or pneumothorax. Solid pulmonary nodule of the right middle lobe measuring 4 mm on series 7, image 163. Upper Abdomen: Cholecystectomy clips.  No acute abnormality. Musculoskeletal: No chest wall abnormality. No acute or significant osseous findings. Review of the MIP images confirms the above findings. IMPRESSION: 1. No evidence of acute pulmonary embolus. 2. Cardiomegaly and mild pulmonary edema. 3. Solid 4 mm pulmonary nodule of the right middle lobe. No follow-up needed if patient is low-risk.This recommendation follows the consensus statement: Guidelines for Management of Incidental Pulmonary Nodules Detected on CT Images: From the Fleischner Society 2017; Radiology 2017; 284:228-243. 4. Aortic Atherosclerosis (ICD10-I70.0). Electronically Signed   By: LYetta GlassmanM.D.   On: 01/22/2022 08:45   DG Chest 2 View  Result Date: 01/21/2022 CLINICAL DATA:  Chest pain. EXAM: CHEST - 2 VIEW COMPARISON:  April 06, 2020 FINDINGS: The heart size and mediastinal contours are within normal limits. There is no evidence of acute infiltrate, pleural effusion or pneumothorax. Radiopaque surgical clips are seen within the right upper quadrant. The visualized skeletal structures are unremarkable. IMPRESSION: No acute cardiopulmonary disease. Electronically Signed   By: TVirgina NorfolkM.D.   On: 01/21/2022 18:05    Cardiac Studies   EF~ 55% no WMA Mild AI  Patient Profile     66y.o. male with CAD and post-COVID-19 CP  Assessment & Plan     Chest Pain with recent COVID-19 infection CAD with poor PCI targets and prior PCI RCA AFL and PAF CHADSVASC 5 Mobitz Type I HB HTN with DM; his medications were started in the setting of some medication non-adherence, AM hypotension  this aM HLD with DM CKD stage IIIa OSA - On Eliquis, No CP and No WMA, defer LHC and continue - with low BP and with Mobtiz Type I, while  on Coreg, decreasing to 12.5 mg PO BID (he is asymptomatic), epelernone 25 mg (aldactone allergy) - on Imdur 120 mg (no ranexa on sotalol) - will hold amlodipine to outpatient (has some medication confusion since last cardiology visit) - on home atorvastatin 80 mg  Has new 02/07/22 visit.  For questions or updates, please contact [facility] Please consult [email or web address] for contact info under Cardiology/STEMI.      MRudean Haskell MD Cardiologist Hypertrophic [address] [place] [place] [id number]([phone] 9:47 AM

## 2022-01-23 NOTE — Progress Notes (Signed)
  Echocardiogram 2D Echocardiogram has been performed.  [name] [name] 01/23/2022, 8:22 AM

## 2022-01-24 ENCOUNTER — Other Ambulatory Visit (HOSPITAL_COMMUNITY): Payer: Self-pay

## 2022-01-24 NOTE — Progress Notes (Signed)
Paramedicine Encounter    Patient ID: [name] [name], male    DOB: 19-Feb-1956, 66 y.o.   MRN: [id number]   Arrived for initial paramedicine home visit for [facility]. He reports feeling okay today with no complaints other than a residual cough with some mucus production. Lung sounds clear on assessment. Vitals obtained and as noted. No swelling noted on exam.   WT- 200lbs BP- 130/60 HR- 77 irregular  02- 95% CBG- 121 (per freestyle Elenor Legato)  [place] reports he has not been compliant with his medications 100% and he said it has bene overwhelming lately with his recent illness.   I asked him to gather all medicines and we would sort them out together.  He had several old outdated prescriptions and I organized all. We reviewed each medication and what its used for and why and the dosing and when to take it.   At present he has all medications at home as prescribed. Only discrepancy is he has Carvedilol 12.75m BID listed on his chart but last encounter in HF clinic he was instructed to take 234mBID, I will confirm same.    I spoke at length to him and his daughter about medications- she plans to set up his pill packs for him on Thursday 6/15. I sent her a detailed list to help with same. I plan to take over this for her next week at our visit.   Ge[place]ports he has been using Novolog insulin in addition to his Lantus. GeBaldomero Lamys not prescribed this medication and says he got it from a friend and it's expired but he reports he uses 8 units as needed if his sugars are high and he says it helps. I educated GeCasimirn only using the medication that he is prescribed and the likely reason his sugar has been high is due to non compliance with his medications and diet/illness. I advised him to not continue using the Novolog and to only take his Lantus in the morning and to be compliant with his Jardiance and Metformin daily. I also encouraged him to be sure he is staying away from sugar, high carbs and  fats. He agreed with this plan and plans to check his sugar with and in between meals using his freestyle sensor.   He reports he has seen Dr ElLoanne Drillingt Le[place]yut we learned today he is retiring so we will need to get him rescheduled with another provider in that clinic. I will assist.   SOCIAL- He reports he has Medicaid but doesn't know if it's still active- I gave him the number to social services to confirm. I do not see it listed in his chart. I reminded his daughter to assist as well.   He reports his financial info- Disability check is $1400 Mortgage is $450 Gas [place] >$300  Energy [place] around $80-$100  He drives himself to appointments. He gets $25 in food stamps monthly  He says he has some issues with mold, plumbing problems and wants to see if there are any programs to assist with seniors and helping with home maintenance. I will ask LCSW JeEliezer Loftsbout same as I am unsure.    I will follow up on same and see him on Monday for next home visit.   [place]/15/2023          Patient Care Team: BaBillie RuddyMD as PCP - General (Family Medicine) McLarey DresserMD as  PCP - Cardiology (Cardiology) Dimitri Ped, RN as Case Manager  Patient Active Problem List   Diagnosis Date Noted   Mobitz (type) I [facility]) atrioventricular block 01/23/2022   Chest pain of uncertain etiology 87/56/4332   Syncope 02/09/2020   Generalized weakness 06/27/2019   Acute on chronic congestive heart failure with left ventricular diastolic dysfunction ([place]) 06/26/2019   Anemia due to chronic kidney disease 06/26/2019   CKD (chronic kidney disease), stage III ([place]) 06/26/2019   Type 2 diabetes mellitus without complication ([place]) 95/18/8416   Iron deficiency anemia due to chronic blood loss 06/26/2019   History of atrial fibrillation    History of type 2 diabetes mellitus    Anemia    Chest pain 04/24/2019    Overweight (BMI 25.0-29.9) 02/28/2017   Unstable angina ([place]) 08/03/2016   S/P laparoscopic cholecystectomy 02/07/2016   Coronary artery disease involving native coronary artery with unstable angina pectoris ([place]) 12/29/2015   Diabetes ([place]) 11/12/2015   Chronic anticoagulation 08/22/2015   Atrial flutter with controlled response ([place]) 08/22/2015   RUQ pain    Cholelithiases- drain in place 06/04/2015   Essential hypertension    Chronic diastolic CHF (congestive heart failure) ([place]) 04/24/2015   Excessive daytime sleepiness 11/25/2014   Obstructive sleep apnea 11/25/2014   NICM (nonischemic cardiomyopathy) ([place]) 03/11/2014   Anticoagulation goal of INR 2 to 3 10/19/2013   Eczema 10/19/2013   Low libido 10/16/2013   Persistent atrial fibrillation ([place]) 10/06/2013   Rectal bleeding 12/18/2011   HYPERCHOLESTEROLEMIA 07/11/2010   DENTAL CARIES 06/30/2010   GOUT 05/05/2009   HEPATITIS B, CHRONIC 12/30/2006   OSTEOCHONDROMA 12/30/2006   Mitral valve disorder 12/30/2006   LIVER FUNCTION TESTS, ABNORMAL, HX OF 12/30/2006   COLONIC POLYPS, HX OF 12/30/2006   COLONOSCOPY, HX OF 05/12/2001    Current Outpatient Medications:    ACCU-CHEK SOFTCLIX LANCETS lancets, Use as instructed for three times daily testing of blood glucose, Disp: 100 each, Rfl: 12   acetaminophen (TYLENOL) 500 MG tablet, Take 1,000 mg by mouth every 6 (six) hours as needed (pain)., Disp: , Rfl:    albuterol (VENTOLIN HFA) 108 (90 Base) MCG/ACT inhaler, Inhale 1-2 puffs into the lungs every 6 (six) hours as needed for wheezing or shortness of breath., Disp: 8 g, Rfl: 0   apixaban (ELIQUIS) 5 MG TABS tablet, Take 1 tablet (5 mg total) by mouth 2 (two) times daily., Disp: 60 tablet, Rfl: 6   atorvastatin (LIPITOR) 80 MG tablet, Take 1 tablet by mouth once daily, Disp: 90 tablet, Rfl: 3   blood glucose meter kit and supplies KIT, Dispense based on patient and insurance preference. Use up to four times daily as directed. (FOR  ICD-9 250.00, 250.01)., Disp: 1 each, Rfl: 0   Blood Glucose Monitoring Suppl (ACCU-CHEK AVIVA PLUS) w/Device KIT, Use as directed for 3 times daily testing of blood glucose, Disp: 1 kit, Rfl: 0   carvedilol (COREG) 12.5 MG tablet, Take 1 tablet (12.5 mg total) by mouth 2 (two) times daily with a meal., Disp: 60 tablet, Rfl: 2   colchicine 0.6 MG tablet, On day 1 take 2 tablets at first sign of gout flare.  Take 1 tablet 1 hour later.  Then take 1 tablet daily starting the next day until resolution of gout flare. (Patient taking differently: Take 0.6 mg by mouth daily. On day 1 take 2 tablets at first sign of gout flare.  Take 1 tablet 1 hour later.  Then take 1 tablet daily starting  the next day until resolution of gout flare as needed.), Disp: 10 tablet, Rfl: 1   Continuous Blood Gluc Receiver (FREESTYLE LIBRE 2 READER) DEVI, Check your blood sugar three times per day before meals and once before bedtime, Disp: 1 each, Rfl: 0   Continuous Blood Gluc Sensor (FREESTYLE LIBRE 2 SENSOR) MISC, Apply one sensor to upper arm every 2 weeks, Disp: 2 each, Rfl: 2   empagliflozin (JARDIANCE) 10 MG TABS tablet, Take 1 tablet (10 mg total) by mouth daily before breakfast., Disp: 30 tablet, Rfl: 11   eplerenone (INSPRA) 25 MG tablet, Take 1 tablet (25 mg total) by mouth daily. (Patient not taking: Reported on 01/05/2022), Disp: 30 tablet, Rfl: 11   glucose blood (ACCU-CHEK AVIVA PLUS) test strip, Use as instructed for 3 times daily testing of blood glucose, Disp: 100 each, Rfl: 12   hydrALAZINE (APRESOLINE) 50 MG tablet, TAKE 1 & 1/2 (ONE & ONE-HALF) TABLETS BY MOUTH TWICE DAILY. MUST MAKE FOLLOW UP APPT FOR FURTHER REFILLS, Disp: 90 tablet, Rfl: 5   insulin glargine (LANTUS SOLOSTAR) 100 UNIT/ML Solostar Pen, Inject 40 Units into the skin every morning. And pen needles 1/day, Disp: 45 mL, Rfl: 3   Insulin Pen Needle (PEN NEEDLES) 32G X 4 MM MISC, 1 each by Does not apply route daily., Disp: 100 each, Rfl: 3    isosorbide mononitrate (IMDUR) 120 MG 24 hr tablet, Take 1 tablet (120 mg total) by mouth daily., Disp: 30 tablet, Rfl: 6   Lancet Devices (ACCU-CHEK SOFTCLIX) lancets, Use as instructed for 3 times daily testing of blood glucose, Disp: 1 each, Rfl: 0   metFORMIN (GLUCOPHAGE) 1000 MG tablet, Take 1,000 mg by mouth daily with breakfast., Disp: 30 tablet, Rfl: 0   nitroGLYCERIN (NITROSTAT) 0.4 MG SL tablet, Place 1 tablet (0.4 mg total) under the tongue every 5 (five) minutes as needed for chest pain (x 3 tabs daily)., Disp: 60 tablet, Rfl: 1   sotalol (BETAPACE) 120 MG tablet, Take 1 tablet (120 mg total) by mouth 2 (two) times daily., Disp: 180 tablet, Rfl: 3   torsemide (DEMADEX) 20 MG tablet, Take 80 mg by mouth 2 (two) times daily., Disp: , Rfl:    TRUEPLUS LANCETS 28G MISC, 1 each by Does not apply route 3 (three) times daily., Disp: 100 each, Rfl: 12 Allergies  Allergen Reactions   Shrimp [Shellfish Allergy] Nausea And Vomiting   Ace Inhibitors Cough   Other Hives and Other (See Comments)    Patient reports developing hives after receiving "some antibiotic given in 1980''s at [facility]". He does not know which antibiotic.     Social History   Socioeconomic History   Marital status: Widowed    Spouse name: Not on file   Number of children: 2   Years of education: 14   Highest education level: Some college, no degree  Occupational History    Employer: UNEMPLOYED  Tobacco Use   Smoking status: Former    Packs/day: 4.00    Years: 18.00    Total pack years: 72.00    Types: Cigarettes    Quit date: 08/14/1983    Years since quitting: 38.4   Smokeless tobacco: Never  Vaping Use   Vaping Use: Never used  Substance and Sexual Activity   Alcohol use: Yes    Alcohol/week: 16.0 standard drinks of alcohol    Types: 6 Cans of beer, 10 Shots of liquor per week    Comment: 2-3 beers watching sports; occasional glass  of liquor; drinks heavily in football season   Drug use: Yes     Types: Marijuana    Comment: twice a week   Sexual activity: Never  Other Topics Concern   Not on file  Social History Narrative   Not on file   Social Determinants of Health   Financial Resource Strain: Low Risk  (07/19/2021)   Overall Financial Resource Strain (CARDIA)    Difficulty of Paying Living Expenses: Not hard at all  Food Insecurity: Food Insecurity Present (12/11/2021)   Hunger Vital Sign    Worried About Running Out of Food in the Last Year: Sometimes true    Ran Out of Food in the Last Year: Never true  Transportation Needs: No Transportation Needs (12/11/2021)   PRAPARE - Hydrologist (Medical): No    Lack of Transportation (Non-Medical): No  Physical Activity: Insufficiently Active (07/19/2021)   Exercise Vital Sign    Days of Exercise per Week: 1 day    Minutes of Exercise per Session: 10 min  Stress: No Stress Concern Present (07/19/2021)   Iron Post    Feeling of Stress : Not at all  Social Connections: Socially Isolated (07/19/2021)   Social Connection and Isolation Panel [NHANES]    Frequency of Communication with Friends and Family: More than three times a week    Frequency of Social Gatherings with Friends and Family: More than three times a week    Attends Religious Services: Never    Marine scientist or Organizations: No    Attends Archivist Meetings: Never    Marital Status: Widowed  Intimate Partner Violence: Not At Risk (07/19/2021)   Humiliation, Afraid, Rape, and Kick questionnaire    Fear of Current or Ex-Partner: No    Emotionally Abused: No    Physically Abused: No    Sexually Abused: No    Physical Exam      Future Appointments  Date Time Provider [place]  02/07/2022  9:30 AM [facility] PA/NP [facility] None  02/16/2022 11:30 AM [facility] CARE M[facility]  03/14/2022 11:40 AM Larey Dresser, MD [facility] None   07/23/2022  1:45 PM [facility]-NURSE HEALTH ADVISOR [facility]     ACTION: Home visit completed

## 2022-01-25 DIAGNOSIS — G4733 Obstructive sleep apnea (adult) (pediatric): Secondary | ICD-10-CM | POA: Diagnosis not present

## 2022-01-31 ENCOUNTER — Encounter (HOSPITAL_COMMUNITY): Payer: Medicare Other

## 2022-02-06 ENCOUNTER — Telehealth (HOSPITAL_COMMUNITY): Payer: Self-pay

## 2022-02-06 NOTE — Telephone Encounter (Signed)
Called to confirm/remind patient of their appointment at [facility] on 02/07/22.   Patient reminded to bring all medications and/or complete list.  Confirmed patient has transportation. Gave directions, instructed to utilize valet parking.  Confirmed appointment prior to ending call.

## 2022-02-07 ENCOUNTER — Other Ambulatory Visit (HOSPITAL_COMMUNITY): Payer: Self-pay

## 2022-02-07 ENCOUNTER — Other Ambulatory Visit (HOSPITAL_COMMUNITY): Payer: Self-pay | Admitting: Cardiology

## 2022-02-07 ENCOUNTER — Ambulatory Visit (HOSPITAL_COMMUNITY)
Admit: 2022-02-07 | Discharge: 2022-02-07 | Disposition: A | Discharge status: Home / Self Care | Payer: Medicare Other | Attending: Family Medicine | Admitting: Family Medicine

## 2022-02-07 ENCOUNTER — Telehealth (HOSPITAL_COMMUNITY): Payer: Self-pay

## 2022-02-07 ENCOUNTER — Encounter (HOSPITAL_COMMUNITY): Payer: Self-pay

## 2022-02-07 VITALS — BP 134/76 | HR 55 | Wt 206.2 lb

## 2022-02-07 DIAGNOSIS — Z91148 Patient's other noncompliance with medication regimen for other reason: Secondary | ICD-10-CM

## 2022-02-07 DIAGNOSIS — N1831 Chronic kidney disease, stage 3a: Secondary | ICD-10-CM

## 2022-02-07 DIAGNOSIS — Z8639 Personal history of other endocrine, nutritional and metabolic disease: Secondary | ICD-10-CM

## 2022-02-07 DIAGNOSIS — E785 Hyperlipidemia, unspecified: Secondary | ICD-10-CM | POA: Diagnosis not present

## 2022-02-07 DIAGNOSIS — I441 Atrioventricular block, second degree: Secondary | ICD-10-CM | POA: Diagnosis not present

## 2022-02-07 DIAGNOSIS — I130 Hypertensive heart and chronic kidney disease with heart failure and stage 1 through stage 4 chronic kidney disease, or unspecified chronic kidney disease: Secondary | ICD-10-CM | POA: Insufficient documentation

## 2022-02-07 DIAGNOSIS — Z7984 Long term (current) use of oral hypoglycemic drugs: Secondary | ICD-10-CM | POA: Insufficient documentation

## 2022-02-07 DIAGNOSIS — Z955 Presence of coronary angioplasty implant and graft: Secondary | ICD-10-CM | POA: Diagnosis not present

## 2022-02-07 DIAGNOSIS — I480 Paroxysmal atrial fibrillation: Secondary | ICD-10-CM | POA: Diagnosis not present

## 2022-02-07 DIAGNOSIS — N183 Chronic kidney disease, stage 3 unspecified: Secondary | ICD-10-CM | POA: Insufficient documentation

## 2022-02-07 DIAGNOSIS — I110 Hypertensive heart disease with heart failure: Secondary | ICD-10-CM | POA: Diagnosis present

## 2022-02-07 DIAGNOSIS — Z79899 Other long term (current) drug therapy: Secondary | ICD-10-CM | POA: Insufficient documentation

## 2022-02-07 DIAGNOSIS — E1122 Type 2 diabetes mellitus with diabetic chronic kidney disease: Secondary | ICD-10-CM | POA: Insufficient documentation

## 2022-02-07 DIAGNOSIS — Z7901 Long term (current) use of anticoagulants: Secondary | ICD-10-CM | POA: Diagnosis not present

## 2022-02-07 DIAGNOSIS — G4733 Obstructive sleep apnea (adult) (pediatric): Secondary | ICD-10-CM | POA: Diagnosis not present

## 2022-02-07 DIAGNOSIS — I25119 Atherosclerotic heart disease of native coronary artery with unspecified angina pectoris: Secondary | ICD-10-CM | POA: Diagnosis not present

## 2022-02-07 DIAGNOSIS — I4892 Unspecified atrial flutter: Secondary | ICD-10-CM | POA: Diagnosis not present

## 2022-02-07 DIAGNOSIS — I10 Essential (primary) hypertension: Secondary | ICD-10-CM

## 2022-02-07 DIAGNOSIS — I5032 Chronic diastolic (congestive) heart failure: Secondary | ICD-10-CM

## 2022-02-07 DIAGNOSIS — I2510 Atherosclerotic heart disease of native coronary artery without angina pectoris: Secondary | ICD-10-CM

## 2022-02-07 LAB — BASIC METABOLIC PANEL
Anion gap: 10 (ref 5–15)
BUN: 16 mg/dL (ref 8–23)
CO2: 26 mmol/L (ref 22–32)
Calcium: 9.7 mg/dL (ref 8.9–10.3)
Chloride: 103 mmol/L (ref 98–111)
Creatinine, Ser: 1.57 mg/dL — ABNORMAL HIGH (ref 0.61–1.24)
GFR, Estimated: 48 mL/min — ABNORMAL LOW (ref >60)
Glucose, Bld: 198 mg/dL — ABNORMAL HIGH (ref 70–99)
Potassium: 3.7 mmol/L (ref 3.5–5.1)
Sodium: 139 mmol/L (ref 135–145)

## 2022-02-07 LAB — CBC
HCT: 48.6 % (ref 39.0–52.0)
Hemoglobin: 15.8 g/dL (ref 13.0–17.0)
MCH: 28.9 pg (ref 26.0–34.0)
MCHC: 32.5 g/dL (ref 30.0–36.0)
MCV: 88.8 fL (ref 80.0–100.0)
Platelets: 120 10*3/uL — ABNORMAL LOW (ref 150–400)
RBC: 5.47 MIL/uL (ref 4.22–5.81)
RDW: 14.2 % (ref 11.5–15.5)
WBC: 5.6 10*3/uL (ref 4.0–10.5)
nRBC: 0 % (ref 0.0–0.2)

## 2022-02-07 LAB — BRAIN NATRIURETIC PEPTIDE: B Natriuretic Peptide: 114.3 pg/mL — ABNORMAL HIGH (ref 0.0–100.0)

## 2022-02-07 NOTE — Progress Notes (Addendum)
Patient ID: [name] [name], male   DOB: [birth year], 66 y.o.   MRN: [id number] PCP: [name], MD HF Cardiology: Dr. Aundra Dubin  66 y.o. with history of HTN, diabetes, paroxysmal atrial fibrillation, and CAD presents for cardiology followup.  He was admitted in 5/13 with chest pain with exertion.  LHC was done showing global HK with EF 45% and diffuse, severe distal and branch vessel disease.  There was no interventional option, but it was suspect that this disease could be causing his anginal-type pain.  Echo at that time was read as showing EF 60-65%.  He was admitted in 5/14 with hypertensive urgency and chest pain.  He ruled out for MI and BP was controlled.  His EF was 50-55% by echo.  ETT-Sestamibi done as outpatient showed small fixed inferior defect with no ischemia but EF was 31%.  He was quite hypertensive during the study.  Cardiac MRI done to confirm EF in 5/14 showed EF 44%.  He had an episode of atrial fibrillation/RVR in 3/15 but went back into NSR.  Echo in 2/16 showed EF 55-60% with moderate diastolic dysfunction.   He had a Cardiolite in 9/16 showing inferior ischemia.  He was having exertional dyspnea with less activity than normal.  There was concern for worsened CAD and he was sent for [facility] in 9/16. By RHC, filling pressures were not significantly elevated. He had a severe distal RCA stenosis that was treated with DES x 2.  Echo showed EF 55-60%.    In 10/16, he was admitted with acute cholecystitis.  He received a cholecystostomy tube.  Cholecystectomy done in 6/17.   Atrial fibrillation in 1/17, cardioverted to NSR.   Recurrent concerning chest pain in 3/17.  Repeat LHC showed diffuse distal and branch vessel disease consistent with poorly-controlled DM, but patent RCA stents.    He was admitted with atrial fibrillation in 12/17. Started on sotalol and converted to NSR.    He had recurrent chest pain in 5/18 with abnormal Cardiolite.  Cath was done again in 5/18,  showing diffuse CAD with no good interventional options.   Admitted 11/20 with increased dyspnea and exertional chest pain. Hgb went down to 8.1. GI consulted.  Had [place] that showed severe distal and branch vessel disease but no interventional targets and not good anatomy for CABG. Continued on medical management. Capsule endoscopy without recent or active bleeding, no AVMs. He went into atrial flutter during his hospitalization. He went back into NSR, however, after discharge.  +Snoring and daytime sleepiness.   He has had episodes of syncope associated with vigorous coughing.   He was in the ER with atrial flutter and dyspnea in 4/21. Zio monitor in 9/22 showed NSR with PACs.   Follow up 1/23 not compliant with medications or Cardiomems.   Follow up 5/23, continued to miss medications, not using Cardiomems. Mild volume overload, instructed to restart meds.  Evaluated in ED 01/14/22 for cough, found to be COVID +. Received molnupiravir.  Admitted 01/22/22 with CP. Cards consulted, troponin negative x 2, Echo showed EF 55-60%, severe asymmetric LV hypertrophy, normal RV. No new WMA and CP resolved, so LHC deferred.  Coreg decreased to 12.5 bid due to low BP and Mobitz Type 1. Discharged home, weight 200 lbs.   Today he returns for post hospital HF follow up. Overall feeling fair. He is SOB walking on flat ground and fatigued. He has occasional dizzy spells, no falls. Denies palpitations, CP, edema, or PND/Orthopnea. Appetite ok.  No fever or chills. Weight at home 198 pounds. Not taking meds regularly, but getting better with help of paramedicine.  Wears CPAP occasionally. Paramedicine now involved. Drinks a lot of fluids and does not weigh regularly at home.  ECG (personally reviewed): SB w AVB 55 bpm, PR 230 msec, QTc 438 msec  Cardiomems: No readings in > 4 months.  Labs (5/13): K 3.6, creatinine 0.93, LDL 85, HDL 57 Labs (7/13): K 3.9, creatinine 1.1, LDL 57, HDL 51  Labs (5/14): K 4,  creatinine 1.25 Labs (8/14): K 3.7, creatinine 0.9, LDL 103, HDL 42 Labs (8/15): K 3.5 creatinine 1.0, BNP 75 Labs (2/16): K 3.8, creatinine 1.1 Labs (9/16): K 3.5, creatinine 1.03, BNP 59, HDL 40, LDL 111, TGs 333 Labs (11/16): K 3.6, creaitnine 1.07, LFTs normal, LDL 44 Labs (6/17): K 3.4, creatinine 1.52 => 1.13 Labs (1/18): K 3.9, creatinine 1.25 Labs (6/18): K 3.8, creatinine 1.21, HCT 39.7 Labs (8/18): LDL 39, HDL 43 Labs (12/18): 4.4, creatinine 1.25, LFTs normal Labs (8/19): K 3.6, creatinine 1.13, LDL 68, HDL 49  Labs (11/20): K 3.5, creatinine 1.33, LDL 25 Labs (4/21): K 4.0, SCr 1.14, BNP 266  Labs (6/21): K 3.7, creatinine 1.25 Labs (12/21): LDL 25 Labs (1/22): BNP 132 Labs (2/22): K 3.4, creatinine 1.44 Labs (8/22): LDL 81 Labs (9/22): K 3.2, creatinine 1.53 Labs (10/22): K 3.7, creatinine 1.39 Labs (11/22): K 4.4, creatinine 1.47 Labs (3/23): K 4.4, creatinine 1.44 Labs (5/23): K 4.2, creatinine 1.78 Labs (6/23): K 4.1, creatinine 1.24, hgb 15.8  PMH: 1. DM2 2. Gout 3. HTN: Cough with ACEI.  4. Cardiomyopathy: Suspect mixed ischemic/nonischemic (?ETOH-related).  EF 35% in 2008.  Echo in 5/13 showed EF 60-65% with moderate LVH but EF was 45% on LV-gram in 5/13.  Echo (5/14) with EF 50-55%, mild LVH, inferobasal HK, mild MR.  ETT-Sestamibi in 5/14, however, showed EF 31%.  Cardiac MRI (6/14) with EF 44%, mild global hypokinesis, subepicardial delayed enhancement in a nonspecific RV insertion site pattern.  Echo (2/16) with EF 55-60%, moderate diastolic dysfunction. RHC (9/16) with mean RA 8, PA 39/20 mean 28, mean PCWP 14, CI 2.2. Echo (9/16) with EF 55-60%, grade II diastolic dysfunction.  - Echo (6/17) with EF 55-60%, moderate LVH, grade II diastolic dysfunction, mild AI, mild MR.  - RHC (11/20): mean RA 4, PA 46/14 mean 26, mean PCWP 13, CI 3.23 - Echo (9/20): EF 60-65%, moderate LVH, normal RV size and systolic function, mild-moderate MR, mild AI.  - Echo (5/21): EF  55%, RV normal - Has Cardiomems.  - Echo (6/23): EF 55-60%, severe asymmetric LV hypertrophy, normal RV. 5. CAD: LHC in 2010 with mild nonobstructive disease.  LHC (5/13) with diffuse distal and branch vessel disease, mild global hypokinesis and EF 45%.  ETT-Sestamibi (5/14): EF 31%, small fixed inferior defect with no ischemia.  Lexiscan Cardiolite (9/16) with EF 26%, moderate inferior defect that was partially reversible, suggesting ischemia => High risk study. LHC (9/16) with 40-50% mLAD, diffuse up to 50% distal LAD, 90% ostium of branch off ramus, 95% dRCA => DES to RCA x 2, EF 55-60%.   - LHC (3/17) with diffuse branch and distal vessel disease c/w poorly controlled DM, RCA stents patent.  - LHC (5/18): Diffuse CAD with no good interventional option. 60% mRCA, 90% dLAD, 50% serial mid ramus stenoses, ramus branches with ostial 80-90% stenoses. - LHC (11/20): Severe distal and branch vessel disease but no interventional targets and not good anatomy for CABG  6. H/o chronic HBV 7. Osteochondroma left shoulder.  8. Hyperlipidemia 9. Paroxysmal atrial fibrillation: Coumadin.  Developed cough and increased ESR with amiodarone.  DCCV in 1/17.  Recurrent atrial fibrillation in 12/17, sotalol started.  10. Atrial flutter: had ablations in 2007 and 2008.  - Zio patch in 4/21 with 20% atrial flutter.  11. OSA on CPAP.  12. Acute cholecystitis (10/16): Cholecystostomy tube placed.  Cholecystectomy 6/17.  13. CKD 14. Anemia - Suspected GI bleed but EGD/c-scope 9/20 without significant findings.  - Capsule Endoscopy (11/20) without recent or active bleeding, no AVMs.  15. Syncope: Suspected cough syncope.  16. OSA  SH: Married, prior smoker.  Some ETOH, occasionally heavy in past.  Does use occasional marijuana. Out of work Engineer, drilling. 2 daughters in grad school.   FH: CAD  ROS: All systems reviewed and negative except as per HPI.   Current Outpatient Medications  Medication Sig Dispense Refill    ACCU-CHEK SOFTCLIX LANCETS lancets Use as instructed for three times daily testing of blood glucose 100 each 12   acetaminophen (TYLENOL) 500 MG tablet Take 1,000 mg by mouth every 6 (six) hours as needed (pain).     albuterol (VENTOLIN HFA) 108 (90 Base) MCG/ACT inhaler Inhale 1-2 puffs into the lungs every 6 (six) hours as needed for wheezing or shortness of breath. 8 g 0   apixaban (ELIQUIS) 5 MG TABS tablet Take 1 tablet (5 mg total) by mouth 2 (two) times daily. 60 tablet 6   atorvastatin (LIPITOR) 80 MG tablet Take 1 tablet by mouth once daily 90 tablet 3   blood glucose meter kit and supplies KIT Dispense based on patient and insurance preference. Use up to four times daily as directed. (FOR ICD-9 250.00, 250.01). 1 each 0   Blood Glucose Monitoring Suppl (ACCU-CHEK AVIVA PLUS) w/Device KIT Use as directed for 3 times daily testing of blood glucose 1 kit 0   carvedilol (COREG) 12.5 MG tablet Take 1 tablet (12.5 mg total) by mouth 2 (two) times daily with a meal. 60 tablet 2   colchicine 0.6 MG tablet On day 1 take 2 tablets at first sign of gout flare.  Take 1 tablet 1 hour later.  Then take 1 tablet daily starting the next day until resolution of gout flare. 10 tablet 1   Continuous Blood Gluc Receiver (FREESTYLE LIBRE 2 READER) DEVI Check your blood sugar three times per day before meals and once before bedtime 1 each 0   Continuous Blood Gluc Sensor (FREESTYLE LIBRE 2 SENSOR) MISC Apply one sensor to upper arm every 2 weeks 2 each 2   empagliflozin (JARDIANCE) 10 MG TABS tablet Take 1 tablet (10 mg total) by mouth daily before breakfast. 30 tablet 11   eplerenone (INSPRA) 25 MG tablet Take 1 tablet (25 mg total) by mouth daily. 30 tablet 11   glucose blood (ACCU-CHEK AVIVA PLUS) test strip Use as instructed for 3 times daily testing of blood glucose 100 each 12   hydrALAZINE (APRESOLINE) 50 MG tablet TAKE 1 & 1/2 (ONE & ONE-HALF) TABLETS BY MOUTH TWICE DAILY. MUST MAKE FOLLOW UP APPT FOR  FURTHER REFILLS 90 tablet 5   insulin glargine (LANTUS SOLOSTAR) 100 UNIT/ML Solostar Pen Inject 40 Units into the skin every morning. And pen needles 1/day 45 mL 3   Insulin Pen Needle (PEN NEEDLES) 32G X 4 MM MISC 1 each by Does not apply route daily. 100 each 3   isosorbide mononitrate (IMDUR) 120 MG 24 hr tablet  Take 1 tablet (120 mg total) by mouth daily. 30 tablet 6   Lancet Devices (ACCU-CHEK SOFTCLIX) lancets Use as instructed for 3 times daily testing of blood glucose 1 each 0   metFORMIN (GLUCOPHAGE) 1000 MG tablet Take 1,000 mg by mouth daily with breakfast. 30 tablet 0   nitroGLYCERIN (NITROSTAT) 0.4 MG SL tablet Place 1 tablet (0.4 mg total) under the tongue every 5 (five) minutes as needed for chest pain (x 3 tabs daily). 60 tablet 1   sotalol (BETAPACE) 120 MG tablet Take 1 tablet (120 mg total) by mouth 2 (two) times daily. 180 tablet 3   torsemide (DEMADEX) 20 MG tablet Take 80 mg by mouth 2 (two) times daily.     TRUEPLUS LANCETS 28G MISC 1 each by Does not apply route 3 (three) times daily. 100 each 12   No current facility-administered medications for this encounter.   BP 134/76   Pulse (!) 55   Wt 93.5 kg (206 lb 3.2 oz)   SpO2 98%   BMI 27.97 kg/m    Wt Readings from Last 3 Encounters:  02/07/22 93.5 kg (206 lb 3.2 oz)  01/24/22 90.7 kg (200 lb)  01/22/22 90.5 kg (199 lb 9.6 oz)   PHYSICAL EXAM: General:  NAD. No resp difficulty HEENT: Normal Neck: Supple. No JVD. Carotids 2+ bilat; no bruits. No lymphadenopathy or thryomegaly appreciated. Cor: PMI nondisplaced. Regular rate & rhythm. No rubs, gallops or murmurs. Lungs: Clear Abdomen: Soft, nontender, nondistended. No hepatosplenomegaly. No bruits or masses. Good bowel sounds. Extremities: No cyanosis, clubbing, rash, edema Neuro: Alert & oriented x 3, cranial nerves grossly intact. Moves all 4 extremities w/o difficulty. Affect pleasant.  Assessment/Plan: 1. Chronic diastolic CHF: EF 61% on cardiac MRI in  6/14 but EF improved back to 55-60% on 2/16 echo.  However, EF down to 26% on Cardiolite in 9/16.  Echo in 9/20 showed EF 60-65%, moderate LVH, normal RV, mild-moderate MR.  Echo in 5/21 with EF 55%, normal RV.  He has a Cardiomems device, but has not used in 4 months. He does not appear volume overloaded on exam, although weight up 6lbs. Suspect due to medication non-compliance and increased fluid intake. NYHA early III symptoms. We discussed importance of taking medications regularly. - Continue eplerenone 25 mg daily (gynecomastia with spiro in past). BMET/BNP today. - Continue torsemide 80 mg bid. May benefit from weekly or every other week metolazone if he continues to miss doses of torsemide. - Continue empagliflozin 10 mg daily.  - Continue hydralazine 75 mg bid (take the pm dose) + Imdur 120 mg daily. - Continue carvedilol 12.5 mg bid. (Recently reduced due to Mobitz Type 1). No increase today with bradycardia. - I encouraged him to send Cardiomems at least 2-3 times/week.  - Paramedicine now on board, appreciate their assistance. 2. Atrial fibrillation/flutter: Paroxysmal. He has had atrial flutter ablations x 2 remotely.  He is on sotalol. He tolerates atrial arrhythmias poorly, seems to worsen CHF.  Zio patch in 4/21 with 20% atrial flutter. He saw EP, repeat ablation was not recommended. Repeat Zio in 9/22 showed only NSR and PADs.   - Continue sotalol, QTc acceptable on ECG today.   - Continue Eliquis. No bleeding issues, CBC today. 3. Coronary artery disease: LHC in 9/16 showed diffuse CAD, most significant involving the distal RCA.  Patient had DES x 2 to RCA.  Cath in 3/17 with patent RCA stents and diffuse distal and branch vessel disease concerning for poorly controlled  diabetes.  Cath in 5/18 again showed diffuse CAD without good interventional target.  Cath in 11/20 showed severe distal and branch vessel disease but no interventional targets and not good anatomy for CABG.  No further  chest pain. - No ASA given Eliquis use.  - Continue atorvastatin 80 mg daily. - Continue Coreg and Imdur daily for angina control.  - Avoid ranolazine with sotalol use.  4. Hyperlipidemia:  On atorvastatin, good lipids 1/23.   5. Hypertension: Mildly elevated today, but has not had AM meds. Consider restart amlodipine 10 mg if remains elevated at next visit. 6. OSA: Encouraged compliance with CPAP. 7. CKD stable 3: BMET today.  8. DM: On SGLT2i + insulin. A1c 15 (6/23). Watch for GU symptoms with worsening A1c. - Blood sugars uncontrolled lately, advised following up with PCP. 9. Non-compliance: Not taking medications regularly. Paramedicine now helping. - Difficult situation. Discussed importance of medication regimen.  Follow up in 6 weeks with Dr. Aundra Dubin, as scheduled.  Maricela Bo [facility] FNP-BC 02/07/2022

## 2022-02-07 NOTE — Patient Instructions (Addendum)
EKG done today.  Labs done today. We will contact you only if your labs are abnormal.  No medication changes were made. Please continue all current medications as prescribed.  Your physician recommends that you keep your scheduled follow-up appointment with Dr. Aundra Dubin on August 2nd, 2023 at 11:40am. Gate Code:[id number]  Please make a post hospital follow up appointment with your primary care physician.    If you have any questions or concerns before your next appointment please send [place] a message through [place] or call our office at [phone].    TO LEAVE A MESSAGE FOR THE NURSE SELECT OPTION 2, PLEASE LEAVE A MESSAGE INCLUDING: YOUR NAME DATE OF BIRTH CALL BACK NUMBER REASON FOR CALL**this is important as we prioritize the call backs  YOU WILL RECEIVE A CALL BACK THE SAME DAY AS LONG AS YOU CALL BEFORE 4:00 PM   Do the following things EVERYDAY: Weigh yourself in the morning before breakfast. Write it down and keep it in a log. Take your medicines as prescribed Eat low salt foods--Limit salt (sodium) to 2000 mg per day.  Stay as active as you can everyday Limit all fluids for the day to less than 2 liters   At the [facility], you and your health needs are our priority. As part of our continuing mission to provide you with exceptional heart care, we have created designated Provider Care Teams. These Care Teams include your primary Cardiologist (physician) and Advanced Practice Providers (APPs- Physician Assistants and Nurse Practitioners) who all work together to provide you with the care you need, when you need it.   You may see any of the following providers on your designated Care Team at your next follow up: Dr Glori Bickers Dr Haynes Kerns, NP Lyda Jester, [place] Audry Riles, PharmD   Please be sure to bring in all your medications bottles to every appointment.

## 2022-02-07 NOTE — Telephone Encounter (Signed)
Had home visit today with [name] [name] as he is a new patient to our Paramedicine Program with the [facility] at [facility]. We reviewed all medications last week at our visit and today I went out for a follow up after his in clinic visit and filled a pill box for him to help improve his med compliance.   However, he presented to me a few pens of Novolog Pens that he reports he has been using in the addition to his Lantus, Metformin and Jardiance- daily as needed when he checks his sugar and its "high". He has a [employer] and reports that if his sugar is over 300 he is using this Novolog Pen and using 3-5 units of same to help lower his blood sugar.   The Novolog pens are not his, he reports they are a family members who no longer uses them. I checked and they are expired. He reports they still work despite them being expired. I reported to him the dangers of using medications that for one are not prescribed to him, and for two that are expired. He verbalized understanding but states he needs a short acting insulin to help lower his blood sugar between meals.   I advised him that I am not able to give him any further advice on this and that he needs to see his PCP for Diabetic Management. He has a phone call scheduled on 7/7 with their office but I will plan to forward this message to his PCP office for further evaluation. I continued to express the importance of not using a medication that is not his and that is expired and he verbalized understanding.   I will follow up in home in two weeks for paramedicine home visit.   Salena Saner, [place] 02/07/2022

## 2022-02-07 NOTE — Progress Notes (Signed)
Paramedicine Encounter    Patient ID: [name] [name], male    DOB: 1956/04/22, 66 y.o.   MRN: [id number]   Arrived for home visit for [name] [name] who was seen in clinic this morning by NP [facility]. He had labs, vitals obtained and [name] [name] confirmed no med changes for this visit prior to home visit.   I reviewed all meds and filled pill box for two weeks. (Top two rows morning and evening for week one and bottom two rows for week two morning and evening) He liked this layout and was happy to have meds organized this way.   I reviewed the importance of taking medications daily morning and evening and to weigh daily and utilizing his cardio mems everyday. He verbalized understanding and reports he will try to improve.   He is still using outdated Novolog which I have instructed him not to use. (This is a family members expired insulin) He continues to insist on using this. I will follow up with PCP for getting an appointment for DM.   We reviewed upcoming appointments and confirmed same. [facility] will be calling on 7/7 for outreach. I will forward note about DM concerns to PCP.   Home visit complete. I will see [name] [name] in two weeks. He knows to call HF clinic or paramedicine team if a need arises.    Refills called into [facility]- Isosorbide Torsemide Inspra Hydralazine  Eliquis    Salena [name], [place] 02/07/2022     Patient Care Team: [name], MD as PCP - General (Family Medicine) Larey Dresser, MD as PCP - Cardiology (Cardiology) Dimitri Ped, RN as Case Manager  Patient Active Problem List   Diagnosis Date Noted   [place] (type) I [facility]) atrioventricular block 01/23/2022   Chest pain of uncertain etiology 46/65/9935   Syncope 02/09/2020   Generalized weakness 06/27/2019   Acute on chronic congestive heart failure with left ventricular diastolic dysfunction ([place]) 06/26/2019   Anemia due to chronic kidney disease  06/26/2019   CKD (chronic kidney disease), stage III ([place]) 06/26/2019   Type 2 diabetes mellitus without complication ([place]) 70/17/7939   Iron deficiency anemia due to chronic blood loss 06/26/2019   History of atrial fibrillation    History of type 2 diabetes mellitus    Anemia    Chest pain 04/24/2019   Overweight (BMI 25.0-29.9) 02/28/2017   Unstable angina ([place]) 08/03/2016   S/P laparoscopic cholecystectomy 02/07/2016   Coronary artery disease involving native coronary artery with unstable angina pectoris ([place]) 12/29/2015   Diabetes ([place]) 11/12/2015   Chronic anticoagulation 08/22/2015   Atrial flutter with controlled response ([place]) 08/22/2015   RUQ pain    Cholelithiases- drain in place 06/04/2015   Essential hypertension    Chronic diastolic CHF (congestive heart failure) ([place]) 04/24/2015   Excessive daytime sleepiness 11/25/2014   Obstructive sleep apnea 11/25/2014   NICM (nonischemic cardiomyopathy) ([place]) 03/11/2014   Anticoagulation goal of INR 2 to 3 10/19/2013   Eczema 10/19/2013   Low libido 10/16/2013   Persistent atrial fibrillation ([place]) 10/06/2013   Rectal bleeding 12/18/2011   HYPERCHOLESTEROLEMIA 07/11/2010   DENTAL CARIES 06/30/2010   GOUT 05/05/2009   HEPATITIS B, CHRONIC 12/30/2006   OSTEOCHONDROMA 12/30/2006   Mitral valve disorder 12/30/2006   LIVER FUNCTION TESTS, ABNORMAL, HX OF 12/30/2006   COLONIC POLYPS, HX OF 12/30/2006   COLONOSCOPY, HX OF 05/12/2001    Current Outpatient Medications:    ACCU-CHEK SOFTCLIX LANCETS lancets, Use as instructed for three  times daily testing of blood glucose, Disp: 100 each, Rfl: 12   acetaminophen (TYLENOL) 500 MG tablet, Take 1,000 mg by mouth every 6 (six) hours as needed (pain)., Disp: , Rfl:    albuterol (VENTOLIN HFA) 108 (90 Base) MCG/ACT inhaler, Inhale 1-2 puffs into the lungs every 6 (six) hours as needed for wheezing or shortness of breath., Disp: 8 g, Rfl: 0   apixaban (ELIQUIS) 5 MG TABS tablet, Take 1  tablet (5 mg total) by mouth 2 (two) times daily., Disp: 60 tablet, Rfl: 6   atorvastatin (LIPITOR) 80 MG tablet, Take 1 tablet by mouth once daily, Disp: 90 tablet, Rfl: 3   blood glucose meter kit and supplies KIT, Dispense based on patient and insurance preference. Use up to four times daily as directed. (FOR ICD-9 250.00, 250.01)., Disp: 1 each, Rfl: 0   Blood Glucose Monitoring Suppl (ACCU-CHEK AVIVA PLUS) w/Device KIT, Use as directed for 3 times daily testing of blood glucose, Disp: 1 kit, Rfl: 0   carvedilol (COREG) 12.5 MG tablet, Take 1 tablet (12.5 mg total) by mouth 2 (two) times daily with a meal., Disp: 60 tablet, Rfl: 2   colchicine 0.6 MG tablet, On day 1 take 2 tablets at first sign of gout flare.  Take 1 tablet 1 hour later.  Then take 1 tablet daily starting the next day until resolution of gout flare., Disp: 10 tablet, Rfl: 1   Continuous Blood Gluc Receiver (FREESTYLE LIBRE 2 READER) DEVI, Check your blood sugar three times per day before meals and once before bedtime, Disp: 1 each, Rfl: 0   Continuous Blood Gluc Sensor (FREESTYLE LIBRE 2 SENSOR) MISC, Apply one sensor to upper arm every 2 weeks, Disp: 2 each, Rfl: 2   empagliflozin (JARDIANCE) 10 MG TABS tablet, Take 1 tablet (10 mg total) by mouth daily before breakfast., Disp: 30 tablet, Rfl: 11   eplerenone (INSPRA) 25 MG tablet, Take 1 tablet (25 mg total) by mouth daily., Disp: 30 tablet, Rfl: 11   glucose blood (ACCU-CHEK AVIVA PLUS) test strip, Use as instructed for 3 times daily testing of blood glucose, Disp: 100 each, Rfl: 12   hydrALAZINE (APRESOLINE) 50 MG tablet, TAKE 1 & 1/2 (ONE & ONE-HALF) TABLETS BY MOUTH TWICE DAILY. MUST MAKE FOLLOW UP APPT FOR FURTHER REFILLS, Disp: 90 tablet, Rfl: 5   insulin glargine (LANTUS SOLOSTAR) 100 UNIT/ML Solostar Pen, Inject 40 Units into the skin every morning. And pen needles 1/day, Disp: 45 mL, Rfl: 3   Insulin Pen Needle (PEN NEEDLES) 32G X 4 MM MISC, 1 each by Does not apply  route daily., Disp: 100 each, Rfl: 3   isosorbide mononitrate (IMDUR) 120 MG 24 hr tablet, Take 1 tablet (120 mg total) by mouth daily., Disp: 30 tablet, Rfl: 6   Lancet Devices (ACCU-CHEK SOFTCLIX) lancets, Use as instructed for 3 times daily testing of blood glucose, Disp: 1 each, Rfl: 0   metFORMIN (GLUCOPHAGE) 1000 MG tablet, Take 1,000 mg by mouth daily with breakfast., Disp: 30 tablet, Rfl: 0   nitroGLYCERIN (NITROSTAT) 0.4 MG SL tablet, Place 1 tablet (0.4 mg total) under the tongue every 5 (five) minutes as needed for chest pain (x 3 tabs daily)., Disp: 60 tablet, Rfl: 1   sotalol (BETAPACE) 120 MG tablet, Take 1 tablet (120 mg total) by mouth 2 (two) times daily., Disp: 180 tablet, Rfl: 3   torsemide (DEMADEX) 20 MG tablet, Take 80 mg by mouth 2 (two) times daily., Disp: , Rfl:  TRUEPLUS LANCETS 28G MISC, 1 each by Does not apply route 3 (three) times daily., Disp: 100 each, Rfl: 12 Allergies  Allergen Reactions   Shrimp [Shellfish Allergy] Nausea And Vomiting   Ace Inhibitors Cough   Other Hives and Other (See Comments)    Patient reports developing hives after receiving "some antibiotic given in 1980''s at [facility]". He does not know which antibiotic.     Social History   Socioeconomic History   Marital status: Widowed    Spouse name: Not on file   Number of children: 2   Years of education: 14   Highest education level: Some college, no degree  Occupational History    Employer: UNEMPLOYED  Tobacco Use   Smoking status: Former    Packs/day: 4.00    Years: 18.00    Total pack years: 72.00    Types: Cigarettes    Quit date: 08/14/1983    Years since quitting: 38.5   Smokeless tobacco: Never  Vaping Use   Vaping Use: Never used  Substance and Sexual Activity   Alcohol use: Yes    Alcohol/week: 16.0 standard drinks of alcohol    Types: 6 Cans of beer, 10 Shots of liquor per week    Comment: 2-3 beers watching sports; occasional glass of liquor; drinks  heavily in football season   Drug use: Yes    Types: Marijuana    Comment: twice a week   Sexual activity: Never  Other Topics Concern   Not on file  Social History Narrative   Not on file   Social Determinants of Health   Financial Resource Strain: Low Risk  (07/19/2021)   Overall Financial Resource Strain (CARDIA)    Difficulty of Paying Living Expenses: Not hard at all  Food Insecurity: Food Insecurity Present (12/11/2021)   Hunger Vital Sign    Worried About Running Out of Food in the Last Year: Sometimes true    Ran Out of Food in the Last Year: Never true  Transportation Needs: No Transportation Needs (12/11/2021)   PRAPARE - Hydrologist (Medical): No    Lack of Transportation (Non-Medical): No  Physical Activity: Insufficiently Active (07/19/2021)   Exercise Vital Sign    Days of Exercise per Week: 1 day    Minutes of Exercise per Session: 10 min  Stress: No Stress Concern Present (07/19/2021)   [place]    Feeling of Stress : Not at all  Social Connections: Socially Isolated (07/19/2021)   Social Connection and Isolation Panel [NHANES]    Frequency of Communication with Friends and Family: More than three times a week    Frequency of Social Gatherings with Friends and Family: More than three times a week    Attends Religious Services: Never    Marine scientist or Organizations: No    Attends Archivist Meetings: Never    Marital Status: Widowed  Intimate Partner Violence: Not At Risk (07/19/2021)   Humiliation, Afraid, Rape, and Kick questionnaire    Fear of Current or Ex-Partner: No    Emotionally Abused: No    Physically Abused: No    Sexually Abused: No    Physical Exam      Future Appointments  Date Time Provider [place]  02/16/2022 11:30 AM [facility]  03/14/2022 11:40 AM Larey Dresser, MD [facility] None  07/23/2022   1:45 PM [facility]-NURSE HEALTH ADVISOR [facility]  PEC     ACTION: Home visit completed

## 2022-02-09 DIAGNOSIS — I2510 Atherosclerotic heart disease of native coronary artery without angina pectoris: Secondary | ICD-10-CM

## 2022-02-09 DIAGNOSIS — I509 Heart failure, unspecified: Secondary | ICD-10-CM | POA: Diagnosis not present

## 2022-02-09 DIAGNOSIS — I110 Hypertensive heart disease with heart failure: Secondary | ICD-10-CM | POA: Diagnosis not present

## 2022-02-09 DIAGNOSIS — I4891 Unspecified atrial fibrillation: Secondary | ICD-10-CM | POA: Diagnosis not present

## 2022-02-09 DIAGNOSIS — E1159 Type 2 diabetes mellitus with other circulatory complications: Secondary | ICD-10-CM | POA: Diagnosis not present

## 2022-02-09 DIAGNOSIS — E785 Hyperlipidemia, unspecified: Secondary | ICD-10-CM | POA: Diagnosis not present

## 2022-02-16 ENCOUNTER — Ambulatory Visit (INDEPENDENT_AMBULATORY_CARE_PROVIDER_SITE_OTHER): Payer: Medicare Other

## 2022-02-16 DIAGNOSIS — E1165 Type 2 diabetes mellitus with hyperglycemia: Secondary | ICD-10-CM

## 2022-02-16 DIAGNOSIS — I4891 Unspecified atrial fibrillation: Secondary | ICD-10-CM

## 2022-02-16 DIAGNOSIS — I5032 Chronic diastolic (congestive) heart failure: Secondary | ICD-10-CM

## 2022-02-16 DIAGNOSIS — I10 Essential (primary) hypertension: Secondary | ICD-10-CM

## 2022-02-16 DIAGNOSIS — E7800 Pure hypercholesterolemia, unspecified: Secondary | ICD-10-CM

## 2022-02-16 NOTE — Chronic Care Management (AMB) (Signed)
Chronic Care Management   CCM RN Visit Note  02/16/2022 Name: [name] [name] MRN: [id number] DOB: Jun 22, 1956  Subjective: [name] [name] is a 66 y.o. year old male who is a primary care patient of [name], MD. The care management team was consulted for assistance with disease management and care coordination needs.    Engaged with patient by telephone for follow up visit in response to provider referral for case management and/or care coordination services.   Consent to Services:  The patient was given information about Chronic Care Management services, agreed to services, and gave verbal consent prior to initiation of services.  Please see initial visit note for detailed documentation.   Patient agreed to services and verbal consent obtained.   Assessment: Review of patient past medical history, allergies, medications, health status, including review of consultants reports, laboratory and other test data, was performed as part of comprehensive evaluation and provision of chronic care management services.   SDOH (Social Determinants of Health) assessments and interventions performed:    CCM Care Plan  Allergies  Allergen Reactions   Shrimp [Shellfish Allergy] Nausea And Vomiting   Ace Inhibitors Cough   Other Hives and Other (See Comments)    Patient reports developing hives after receiving "some antibiotic given in 1980''s at [facility]". He does not know which antibiotic.    Outpatient Encounter Medications as of 02/16/2022  Medication Sig   ACCU-CHEK SOFTCLIX LANCETS lancets Use as instructed for three times daily testing of blood glucose   acetaminophen (TYLENOL) 500 MG tablet Take 1,000 mg by mouth every 6 (six) hours as needed (pain).   albuterol (VENTOLIN HFA) 108 (90 Base) MCG/ACT inhaler Inhale 1-2 puffs into the lungs every 6 (six) hours as needed for wheezing or shortness of breath. (Patient not taking: Reported on 02/07/2022)   apixaban  (ELIQUIS) 5 MG TABS tablet Take 1 tablet (5 mg total) by mouth 2 (two) times daily.   atorvastatin (LIPITOR) 80 MG tablet Take 1 tablet by mouth once daily   blood glucose meter kit and supplies KIT Dispense based on patient and insurance preference. Use up to four times daily as directed. (FOR ICD-9 250.00, 250.01).   Blood Glucose Monitoring Suppl (ACCU-CHEK AVIVA PLUS) w/Device KIT Use as directed for 3 times daily testing of blood glucose   carvedilol (COREG) 12.5 MG tablet Take 1 tablet (12.5 mg total) by mouth 2 (two) times daily with a meal.   colchicine 0.6 MG tablet On day 1 take 2 tablets at first sign of gout flare.  Take 1 tablet 1 hour later.  Then take 1 tablet daily starting the next day until resolution of gout flare. (Patient not taking: Reported on 02/07/2022)   Continuous Blood Gluc Receiver (FREESTYLE LIBRE 2 READER) DEVI Check your blood sugar three times per day before meals and once before bedtime   Continuous Blood Gluc Sensor (FREESTYLE LIBRE 2 SENSOR) MISC Apply one sensor to upper arm every 2 weeks   empagliflozin (JARDIANCE) 10 MG TABS tablet Take 1 tablet (10 mg total) by mouth daily before breakfast.   eplerenone (INSPRA) 25 MG tablet Take 1 tablet (25 mg total) by mouth daily.   glucose blood (ACCU-CHEK AVIVA PLUS) test strip Use as instructed for 3 times daily testing of blood glucose   hydrALAZINE (APRESOLINE) 50 MG tablet TAKE 1 & 1/2 (ONE & ONE-HALF) TABLETS BY MOUTH TWICE DAILY .   insulin glargine (LANTUS SOLOSTAR) 100 UNIT/ML Solostar Pen Inject 40 Units  into the skin every morning. And pen needles 1/day   Insulin Pen Needle (PEN NEEDLES) 32G X 4 MM MISC 1 each by Does not apply route daily.   isosorbide mononitrate (IMDUR) 120 MG 24 hr tablet Take 1 tablet (120 mg total) by mouth daily.   Lancet Devices (ACCU-CHEK SOFTCLIX) lancets Use as instructed for 3 times daily testing of blood glucose   metFORMIN (GLUCOPHAGE) 1000 MG tablet Take 1,000 mg by mouth daily  with breakfast.   nitroGLYCERIN (NITROSTAT) 0.4 MG SL tablet Place 1 tablet (0.4 mg total) under the tongue every 5 (five) minutes as needed for chest pain (x 3 tabs daily).   sotalol (BETAPACE) 120 MG tablet Take 1 tablet (120 mg total) by mouth 2 (two) times daily.   torsemide (DEMADEX) 20 MG tablet Take 80 mg by mouth 2 (two) times daily.   TRUEPLUS LANCETS 28G MISC 1 each by Does not apply route 3 (three) times daily.   No facility-administered encounter medications on file as of 02/16/2022.    Patient Active Problem List   Diagnosis Date Noted   Mobitz (type) I (Wenckebach's) atrioventricular block 01/23/2022   Chest pain of uncertain etiology 20/94/7096   Syncope 02/09/2020   Generalized weakness 06/27/2019   Acute on chronic congestive heart failure with left ventricular diastolic dysfunction ([place]) 06/26/2019   Anemia due to chronic kidney disease 06/26/2019   CKD (chronic kidney disease), stage III ([place]) 06/26/2019   Type 2 diabetes mellitus without complication ([place]) 28/36/6294   Iron deficiency anemia due to chronic blood loss 06/26/2019   History of atrial fibrillation    History of type 2 diabetes mellitus    Anemia    Chest pain 04/24/2019   Overweight (BMI 25.0-29.9) 02/28/2017   Unstable angina ([place]) 08/03/2016   S/P laparoscopic cholecystectomy 02/07/2016   Coronary artery disease involving native coronary artery with unstable angina pectoris ([place]) 12/29/2015   Diabetes ([place]) 11/12/2015   Chronic anticoagulation 08/22/2015   Atrial flutter with controlled response ([place]) 08/22/2015   RUQ pain    Cholelithiases- drain in place 06/04/2015   Essential hypertension    Chronic diastolic CHF (congestive heart failure) ([place]) 04/24/2015   Excessive daytime sleepiness 11/25/2014   Obstructive sleep apnea 11/25/2014   NICM (nonischemic cardiomyopathy) ([place]) 03/11/2014   Anticoagulation goal of INR 2 to 3 10/19/2013   Eczema 10/19/2013   Low libido 10/16/2013   Persistent  atrial fibrillation ([place]) 10/06/2013   Rectal bleeding 12/18/2011   HYPERCHOLESTEROLEMIA 07/11/2010   DENTAL CARIES 06/30/2010   GOUT 05/05/2009   HEPATITIS B, CHRONIC 12/30/2006   OSTEOCHONDROMA 12/30/2006   Mitral valve disorder 12/30/2006   LIVER FUNCTION TESTS, ABNORMAL, HX OF 12/30/2006   COLONIC POLYPS, HX OF 12/30/2006   COLONOSCOPY, HX OF 05/12/2001    Conditions to be addressed/monitored:Atrial Fibrillation, CHF, CAD, HTN, HLD, and DMII  Care Plan : RN Care Manager Plan of Care  Updates made by Dimitri Ped, RN since 02/16/2022 12:00 AM     Problem: Chronic Disease Management and Care Coordination Needs (CHF,CAD,Atrial Fib, DMHTN and HLD)   Priority: High     Long-Range Goal: Establish Plan of Care for Chronic Disease Management Needs (CHF,CAD,Atrial Fib, DM,HTN and HLD)   Start Date: 08/25/2021  Expected End Date: 08/22/2022  Priority: High  Note:   Current Barriers:  Knowledge Deficits related to plan of care for management of Atrial Fibrillation, CHF, CAD, HTN, HLD, and DMII  Care Coordination needs related to Lacks knowledge of community resource:  home repair Chronic Disease Management support and education needs related to Atrial Fibrillation, CHF, CAD, HTN, HLD, and DMII  States the paramedic is now coming to visit him.  States she filled his pill box and he is trying to take his medications better.  States he has only missed his medications once.  States he has been using his Cardiomem a few times a week and his weight is around 200.  States he does have a little shortness of breath with exertion.  Denies any swelling or chest pains.    States he does not use a pill box but he does have a pill box.  States he continues eat fast food 2-3 times a week such as [employer] or [employer] when he is keeping his grandson.   States he has not checked  his B/P recently.  States he has been using the [employer] 4-6 times a day and his sugars are ranging 84-250.  States his is  taking his Lantus better.  States he also takes Novolog insulin if his sugars are over 250.  States he got the Novolog insulin from a family member who is not using anymore.   States he has not using his CPAP every night. States he will call today to make an appointment with Dr. Volanda Napoleon. RNCM Clinical Goal(s):  Patient will verbalize understanding of plan for management of Atrial Fibrillation, CHF, CAD, HTN, HLD, and DMII as evidenced by voiced adherence to plan of care verbalize basic understanding of  Atrial Fibrillation, CHF, CAD, HTN, HLD, and DMII disease process and self health management plan as evidenced by voiced understanding and teach back take all medications exactly as prescribed and will call provider for medication related questions as evidenced by dispense report and pt verbalization attend all scheduled medical appointments: Heart Failure Clinic 03/14/22 as evidenced by medical records demonstrate Improved adherence to prescribed treatment plan for Atrial Fibrillation, CHF, CAD, HTN, HLD, and DMII as evidenced by readings within limits, voiced adherence to plan of care continue to work with RN Care Manager to address care management and care coordination needs related to  Atrial Fibrillation, CHF, CAD, HTN, HLD, and DMII as evidenced by adherence to CM Team Scheduled appointments work with community resource care guide to address needs related to  [employer] knowledge of community resource: home repairs as evidenced by patient and/or community resource care guide support through collaboration with Consulting civil engineer, provider, and care team.   Interventions: 1:1 collaboration with primary care provider regarding development and update of comprehensive plan of care as evidenced by provider attestation and co-signature Inter-disciplinary care team collaboration (see longitudinal plan of care) Evaluation of current treatment plan related to  self management and patient's adherence to plan as  established by provider Communicated with provider of pt's use of Novolog insulin that is not prescribed for him   AFIB Interventions: (Status:  Goal on track:  Yes.) Long Term Goal   Counseled on increased risk of stroke due to Afib and benefits of anticoagulation for stroke prevention Reviewed importance of adherence to anticoagulant exactly as prescribed Counseled on bleeding risk associated with Eliquis and importance of self-monitoring for signs/symptoms of bleeding Counseled on avoidance of NSAIDs due to increased bleeding risk with anticoagulants Counseled on seeking medical attention after a head injury or if there is blood in the urine/stool Reinforced importance of taking his night time dose of Eliquis every day   CAD Interventions: (Status:  Goal on track:  Yes.) Long Term Goal Assessed  understanding of CAD diagnosis Medications reviewed including medications utilized in CAD treatment plan Provided education on importance of blood pressure control in management of CAD Provided education on Importance of limiting foods high in cholesterol Reviewed Importance of taking all medications as prescribed Advised to report any changes in symptoms or exercise tolerance Reinforced signs and symptoms to call doctor and when to call 911   Heart Failure Interventions:  (Status:  Goal on track:  Yes.) Long Term Goal Basic overview and discussion of pathophysiology of Heart Failure reviewed Provided education on low sodium diet Reviewed Heart Failure Action Plan in depth and provided written copy Provided education about placing scale on hard, flat surface Advised patient to weigh each morning after emptying bladder Discussed importance of daily weight and advised patient to weigh and record daily Reviewed role of diuretics in prevention of fluid overload and management of heart failure; Discussed the importance of keeping all appointments with provider Reviewed  to continue to work with  the paramedic Reinforced to send in [place] daily. Reviewed importance of keeping appointment with HF clinic on 03/14/22.  Reinforced s/sx of HF to call provider.    Diabetes Interventions:  (Status:  Goal on track:  NO.) Long Term Goal Assessed patient's understanding of A1c goal: <7% Provided education to patient about basic DM disease process Reviewed medications with patient and discussed importance of medication adherence Counseled on importance of regular laboratory monitoring as prescribed Provided patient with written educational materials related to hypo and hyperglycemia and importance of correct treatment Advised patient, providing education and rationale, to check cbg daily and record, calling provider for findings outside established parameters Referral made to community resources care guide team for assistance with pt has numbers to call to get window repaired. Reinforced to call numbers to see about getting repairs made  Reviewed that he need to see Dr. Volanda Napoleon to check his A1C and to see if she wants to order Novolog insulin for him and to call today to schedule. Reviewed to not use someone else's medications.    Reinforced to try to check his blood sugars and to avoid fast food.  Reinforced again to make follow up appointment with provider to check his diabetes Lab Results  Component Value Date   HGBA1C 8.7 (A) 05/05/2021   Hyperlipidemia Interventions:  (Status:  Condition stable.  Not addressed this visit.) Long Term Goal Medication review performed; medication list updated in electronic medical record.  Provider established cholesterol goals reviewed Counseled on importance of regular laboratory monitoring as prescribed Reviewed role and benefits of statin for ASCVD risk reduction Reviewed importance of limiting foods high in cholesterol  Hypertension Interventions:  (Status:  Goal on track:  Yes.) Long Term Goal Last practice recorded BP readings:  BP Readings from Last 3  Encounters:  02/07/22 134/76  01/24/22 130/60  01/23/22 111/60  Most recent eGFR/CrCl: No results found for: EGFR  No components found for: CRCL  Evaluation of current treatment plan related to hypertension self management and patient's adherence to plan as established by provider Provided education to patient re: stroke prevention, s/s of heart attack and stroke Advised patient, providing education and rationale, to monitor blood pressure daily and record, calling PCP for findings outside established parameters Discussed complications of poorly controlled blood pressure such as heart disease, stroke, circulatory complications, vision complications, kidney impairment, sexual dysfunction Reinforced to check his B/P daily and keep log to take to MD appointments  Patient Goals/Self-Care Activities: Take all medications as prescribed Attend  all scheduled provider appointments Call pharmacy for medication refills 3-7 days in advance of running out of medications Call provider office for new concerns or questions  call office if I gain more than 2 pounds in one day or 5 pounds in one week keep legs up while sitting watch for swelling in feet, ankles and legs every day weigh myself daily track symptoms and what helps feel better or worse Send Cardiomem reading daily schedule appointment with eye doctor check blood sugar at prescribed times: once daily and when you have symptoms of low or high blood sugar check feet daily for cuts, sores or redness take the blood sugar log to all doctor visits fill half of plate with vegetables limit fast food meals to no more than 1 per week manage portion size switch to sugar-free drinks wash and dry feet carefully every day check pulse (heart) rate once a day make a plan to eat healthy take medicine as prescribed check blood pressure daily choose a place to take my blood pressure (home, clinic or office, retail store) keep a blood pressure log take  blood pressure log to all doctor appointments take medications for blood pressure exactly as prescribed eat more whole grains, fruits and vegetables, lean meats and healthy fats limit salt intake to 2061m/day call for medicine refill 2 or 3 days before it runs out take all medications exactly as prescribed call doctor with any symptoms you believe are related to your medicine  Follow Up Plan:  Telephone follow up appointment with care management team member scheduled for:  03/23/22 The patient has been provided with contact information for the care management team and has been advised to call with any health related questions or concerns.       Plan:Telephone follow up appointment with care management team member scheduled for:  03/23/22 The patient has been provided with contact information for the care management team and has been advised to call with any health related questions or concerns.  MPeter GarterRN, BJackquline Denmark CDE Care Management Coordinator [facility] ([phone]

## 2022-02-16 NOTE — Patient Instructions (Signed)
Visit Information  Thank you for taking time to visit with me today. Please don't hesitate to contact me if I can be of assistance to you before our next scheduled telephone appointment.  Following are the goals we discussed today:  Take all medications as prescribed Attend all scheduled provider appointments Call pharmacy for medication refills 3-7 days in advance of running out of medications Call provider office for new concerns or questions  call office if I gain more than 2 pounds in one day or 5 pounds in one week keep legs up while sitting watch for swelling in feet, ankles and legs every day weigh myself daily track symptoms and what helps feel better or worse Send Cardiomem reading daily schedule appointment with eye doctor check blood sugar at prescribed times: once daily and when you have symptoms of low or high blood sugar check feet daily for cuts, sores or redness take the blood sugar log to all doctor visits fill half of plate with vegetables limit fast food meals to no more than 1 per week manage portion size switch to sugar-free drinks wash and dry feet carefully every day check pulse (heart) rate once a day make a plan to eat healthy take medicine as prescribed check blood pressure daily choose a place to take my blood pressure (home, clinic or office, retail store) keep a blood pressure log take blood pressure log to all doctor appointments take medications for blood pressure exactly as prescribed eat more whole grains, fruits and vegetables, lean meats and healthy fats limit salt intake to '2000mg'$ /day call for medicine refill 2 or 3 days before it runs out take all medications exactly as prescribed call doctor with any symptoms you believe are related to your medicine  Our next appointment is by telephone on 03/23/22 at 11:30 AM  Please call the care guide team at [phone] if you need to cancel or reschedule your appointment.   If you are experiencing a  Mental Health or [place] or need someone to talk to, please call the Suicide and Crisis Lifeline: 988 call the [place] National Suicide Prevention Lifeline: 5482346292 or TTY: 610-069-3183 TTY 218-065-7694) to talk to a trained counselor call 1-800-273-TALK (toll free, 24 hour hotline) go to [facility] [address], [place] [phone]) call 911   Patient verbalizes understanding of instructions and care plan provided today and agrees to view in [place]. Active MyChart status and patient understanding of how to access instructions and care plan via MyChart confirmed with patient.     Peter Garter RN, Jackquline [place], CDE Care Management Coordinator [facility] [phone]

## 2022-02-19 ENCOUNTER — Encounter: Payer: Self-pay | Admitting: Family Medicine

## 2022-02-19 ENCOUNTER — Ambulatory Visit (INDEPENDENT_AMBULATORY_CARE_PROVIDER_SITE_OTHER): Payer: Medicare Other | Admitting: Family Medicine

## 2022-02-19 VITALS — BP 96/76 | HR 76 | Temp 98.0°F | Wt 204.4 lb

## 2022-02-19 DIAGNOSIS — I5032 Chronic diastolic (congestive) heart failure: Secondary | ICD-10-CM | POA: Diagnosis not present

## 2022-02-19 DIAGNOSIS — M5441 Lumbago with sciatica, right side: Secondary | ICD-10-CM

## 2022-02-19 DIAGNOSIS — I10 Essential (primary) hypertension: Secondary | ICD-10-CM | POA: Diagnosis not present

## 2022-02-19 DIAGNOSIS — E1165 Type 2 diabetes mellitus with hyperglycemia: Secondary | ICD-10-CM | POA: Diagnosis not present

## 2022-02-19 DIAGNOSIS — F1090 Alcohol use, unspecified, uncomplicated: Secondary | ICD-10-CM

## 2022-02-19 DIAGNOSIS — R140 Abdominal distension (gaseous): Secondary | ICD-10-CM

## 2022-02-19 LAB — CBC WITH DIFFERENTIAL/PLATELET
Basophils Absolute: 0 10*3/uL (ref 0.0–0.1)
Basophils Relative: 0.5 % (ref 0.0–3.0)
Eosinophils Absolute: 0.2 10*3/uL (ref 0.0–0.7)
Eosinophils Relative: 4.3 % (ref 0.0–5.0)
HCT: 48.4 % (ref 39.0–52.0)
Hemoglobin: 15.8 g/dL (ref 13.0–17.0)
Lymphocytes Relative: 26.5 % (ref 12.0–46.0)
Lymphs Abs: 1.5 10*3/uL (ref 0.7–4.0)
MCHC: 32.7 g/dL (ref 30.0–36.0)
MCV: 88.8 fl (ref 78.0–100.0)
Monocytes Absolute: 0.7 10*3/uL (ref 0.1–1.0)
Monocytes Relative: 12.7 % — ABNORMAL HIGH (ref 3.0–12.0)
Neutro Abs: 3.1 10*3/uL (ref 1.4–7.7)
Neutrophils Relative %: 56 % (ref 43.0–77.0)
Platelets: 150 10*3/uL (ref 150.0–400.0)
RBC: 5.46 Mil/uL (ref 4.22–5.81)
RDW: 15.1 % (ref 11.5–15.5)
WBC: 5.6 10*3/uL (ref 4.0–10.5)

## 2022-02-19 LAB — COMPREHENSIVE METABOLIC PANEL
ALT: 12 U/L (ref 0–53)
AST: 14 U/L (ref 0–37)
Albumin: 3.9 g/dL (ref 3.5–5.2)
Alkaline Phosphatase: 55 U/L (ref 39–117)
BUN: 31 mg/dL — ABNORMAL HIGH (ref 6–23)
CO2: 29 mEq/L (ref 19–32)
Calcium: 9.6 mg/dL (ref 8.4–10.5)
Chloride: 99 mEq/L (ref 96–112)
Creatinine, Ser: 1.55 mg/dL — ABNORMAL HIGH (ref 0.40–1.50)
GFR: 45.91 mL/min — ABNORMAL LOW (ref >60.00)
Glucose, Bld: 311 mg/dL — ABNORMAL HIGH (ref 70–99)
Potassium: 4.1 mEq/L (ref 3.5–5.1)
Sodium: 135 mEq/L (ref 135–145)
Total Bilirubin: 0.7 mg/dL (ref 0.2–1.2)
Total Protein: 7.4 g/dL (ref 6.0–8.3)

## 2022-02-19 LAB — LIPASE: Lipase: 46 U/L (ref 11.0–59.0)

## 2022-02-19 LAB — BRAIN NATRIURETIC PEPTIDE: Pro B Natriuretic peptide (BNP): 75 pg/mL (ref 0.0–100.0)

## 2022-02-19 MED ORDER — FREESTYLE LIBRE 2 READER DEVI: 11 refills | Status: DC

## 2022-02-19 NOTE — Progress Notes (Signed)
Subjective:    Patient ID: [name] [name], male    DOB: [birth year], 66 y.o.   MRN: [id number]  Chief Complaint  Patient presents with   Diabetes   Back Pain    Patient c/o back pain going on for 2-3 days. He states sometimes the pain radiates to his abdomen. Added he "felt something hard" on his middle left quadrant abdomen"    HPI Patient is  a 66 yo male with pmh sig for CAD, HTN, anxiety, Hep B, afib s/p ablations on anticoagulation, diastolic CHF, who was seen today for acute concern and follow-up.  Patient endorses bilateral low back pain with radiation into right LE x2-3 days.  Symptoms started after pt completed a long car ride to [place].  Patient keeps wallet in right back pocket.  Tylenol helps symptoms.  Patient denies weakness in right LE.   Also endorses intermittent abdominal pain that last 3-4 minutes.  At times feels like there is something hard on left side of the abdomen.  Patient denies dysuria, hematuria, constipation.  Blood sugar elevated this morning.  Was 57 recently.  Patient endorses drinking alcohol last night.  Will have a few drinks most nights.  Had cereal with Splenda and fruit cocktail this morning.  Patient endorses episode of hypoglycemia blood sugar 50.  Blood sugar was in the 100s.  Requesting refill on freestyle libre sensor  Past Medical History:  Diagnosis Date   Anxiety    CAD (coronary artery disease), native coronary artery    a. Nonobstructive CAD by cath 2013 - diffuse distal and branch vessel CAD, no severe disease in the major coronaries, LV mild global hypokinesis, EF 45%. b. ETT-Sestamibi 5/14: EF 31%, small fixed inferior defect with no ischemia.   Chronic CHF ([place])    a. Mixed ICM/NICM (?EtOH). EF 35% in 2008. Echo 5/13: EF 60-65%, mod LVH, EF 45% on V gram in 12/2011. EF 12/2012: EF 50-55%, mild LVH, inferobasal HK, mild MR. ETT-Ses 5/14 EF 41%. Cardiac MRI 5/14: EF 44%, mild global HK, subepicardial delayed enhancement in  nonspecific RV insertion pattern.   COLONIC POLYPS, HX OF 12/30/2006   Gout    H/O atrial flutter 08/13/2005   a. Ablations in 2007, 2008.   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 He does not know which antibiotic.    ROS General: Denies fever, chills, night sweats, changes in weight, changes in appetite HEENT: Denies headaches, ear pain, changes in vision, rhinorrhea, sore throat CV: Denies CP, palpitations, SOB, orthopnea Pulm: Denies SOB, cough, wheezing GI: Denies abdominal pain, nausea, vomiting, diarrhea, constipation + intermittent left-sided abdominal pain GU: Denies dysuria, hematuria, frequency Msk: Denies  muscle cramps, joint pains  + low back pain with radiation into RLE Neuro: Denies weakness, numbness, tingling Skin: Denies rashes, bruising Psych: Denies depression, anxiety, hallucinations    Objective:    Blood pressure 96/76, pulse 76, temperature 98 F (36.7 C), temperature source Oral, weight 204 lb 6.4 oz (92.7 kg), SpO2 97 %.  Gen. Pleasant, well-nourished, in no distress, normal affect   HEENT: [place]/AT, face symmetric, conjunctiva clear, no scleral icterus, PERRLA, EOMI, nares patent without  drainage Lungs: no accessory muscle use, CTAB, no wheezes or rales Cardiovascular: RRR, no m/r/g, no peripheral edema Abdomen: Central obesity, BS present, soft, NT/ND, no hepatosplenomegaly. Musculoskeletal: No TTP of cervical, thoracic, lumbar spine, paraspinal muscles.  No weakness in LEs bilaterally.  No deformities, no cyanosis or clubbing, normal tone Neuro:  A&Ox3, CN II-XII intact, normal gait Skin:  Warm, no lesions/ rash   Wt Readings from Last 3 Encounters:  02/19/22 204 lb 6.4 oz (92.7 kg)  02/07/22 206 lb 3.2 oz (93.5 kg)  01/24/22 200 lb (90.7 kg)    Lab Results  Component Value Date   WBC 5.6 02/07/2022   HGB 15.8 02/07/2022   HCT 48.6 02/07/2022   PLT 120 (L) 02/07/2022   GLUCOSE 198 (H) 02/07/2022   CHOL 115 08/28/2021   TRIG 98 08/28/2021   HDL 45 08/28/2021   LDLDIRECT 111.0 04/22/2015   LDLCALC 50 08/28/2021   ALT 22 01/13/2022   AST 25 01/13/2022   NA 139 02/07/2022   K 3.7 02/07/2022   CL 103 02/07/2022   CREATININE 1.57 (H) 02/07/2022   BUN 16 02/07/2022   CO2 26 02/07/2022   TSH 0.86 12/12/2020   PSA 0.91 03/30/2010   INR 1.2 04/29/2021   HGBA1C 15.0 (H) 01/22/2022   MICROALBUR 1.7 07/20/2020    Assessment/Plan:  Acute bilateral low back pain with right-sided sciatica -Symptoms likely exacerbated by recent long car ride and by having while in back pocket -Discussed supportive care including stretching, heat, ice, topical analgesics -Discussed PT, muscle relaxers -Given handout  Uncontrolled type 2 diabetes mellitus with hyperglycemia ([place]) -Hemoglobin A1c 15% on 01/22/2022.  Previously 8.7% on 05/05/2021. -Discussed the importance of lifestyle modifications and medication compliance -Continue Lantus 40 units once daily, metformin 1000 mg twice daily, Jardiance 10 mg daily -Patient encouraged to keep follow-up with endocrinology, previously seen by Dr. Loanne Drilling -Continue statin -Patient to schedule eye exam  - Plan: Continuous Blood Gluc  Receiver (FREESTYLE LIBRE 2 READER) DEVI  Essential hypertension -Elevated -Recheck BP -Continue current medications -Discussed the importance of lifestyle modifications including decreasing alcohol intake.  Abdominal distention -Discussed possible causes including increased gas -Patient encouraged to keep food diary and decrease intake of gaseous producing foods -Consider simethicone as needed -For continued or worsening symptoms we will schedule follow-up with GI  - Plan: Lipase, CBC with Differential/Platelet, CMP, Brain Natriuretic Peptide  Chronic diastolic CHF (congestive heart failure) (HCC) -Euvolemic on exam -Obtain labs -Continue lifestyle modifications -Continue current medications including eplerenone 25 mg daily, hydralazine 75 mg twice daily, Imdur 120 mg daily -2 beta-blockers listed on patient's med list.  Will clarify with patient and pharmacy in regards to what he is taking.  Sotalol 120 mg daily and Coreg 12.5 mg twice daily listed. -Continue follow-up with cardiology  - Plan: CMP, Brain Natriuretic Peptide  Alcohol use disorder -Alcohol cessation counseling -Patient encouraged to cut down on amount of alcohol intake.  Patient advised not to stop cold [place]. -Given handout -Continue to monitor  F/u 6 wks  Grier Mitts, MD

## 2022-02-19 NOTE — Patient Instructions (Addendum)
You can use over-the-counter Biofreeze, IcyHot, Aspercreme for your low back pain.  You can also apply heat to your lower back for 10 minutes at a time.  Do the stretching exercises shown in clinic to help with right-sided sciatica.  Prescription for freestyle libre 2 was sent to your pharmacy.

## 2022-02-20 ENCOUNTER — Telehealth: Payer: Self-pay

## 2022-02-20 ENCOUNTER — Telehealth: Payer: Self-pay | Admitting: Family Medicine

## 2022-02-20 DIAGNOSIS — E1165 Type 2 diabetes mellitus with hyperglycemia: Secondary | ICD-10-CM

## 2022-02-20 NOTE — Telephone Encounter (Signed)
Pt is calling and had an appt with dr banks yesterday and would like new rx from dr banks Continuous Blood Gluc Sensor (FREESTYLE LIBRE 2 SENSOR) [facility] [place] ([place] [place] [facility] [place]

## 2022-02-20 NOTE — Telephone Encounter (Signed)
We are unable to process your request for prior authorization for FREESTY LIBR MIS 2 READER for the above member due to [facility] has a denied request on file for [place] MIS 2 READER for this member. Please refer to the appeals process outlined in the original denial or contact Prior Authorization Department at [phone] for further questions.

## 2022-02-21 ENCOUNTER — Telehealth (HOSPITAL_COMMUNITY): Payer: Self-pay

## 2022-02-21 NOTE — Telephone Encounter (Signed)
Attempted to set up home visit with [name] [name] however he reports he is not feeling well having GI symptoms including vomiting and diarrhea. He reports he believes he has caught a stomach virus. He states he has medications in his pill box still and requests a home visit next week. I plan to reach out to set up a visit Monday. He agreed. I advised him if symptoms need medical attention to seek care at [facility], Urgent Care or PCP. He agreed. Call complete.   Salena Saner, [place] 02/21/2022

## 2022-02-21 NOTE — Telephone Encounter (Signed)
Contacted pharmacy. They stated patient still has 4 refills left on the med. Attempted to inform patient.voicemail is full.

## 2022-02-24 DIAGNOSIS — G4733 Obstructive sleep apnea (adult) (pediatric): Secondary | ICD-10-CM | POA: Diagnosis not present

## 2022-02-28 ENCOUNTER — Telehealth (HOSPITAL_COMMUNITY): Payer: Self-pay | Admitting: Licensed Clinical Social Worker

## 2022-02-28 ENCOUNTER — Other Ambulatory Visit (HOSPITAL_COMMUNITY): Payer: Self-pay

## 2022-02-28 NOTE — Telephone Encounter (Signed)
HF Paramedicine Team Based Care Meeting  HF MD- NA  HF NP - [place] NP-C   [facility] admit within the last 30 days for heart failure? no  Medications concerns? Very non compliant despite having knowledge of what he is supposed to be doing- very bad at taking medications at night  Education needs? Taking medications that are not prescribed to him so needs eduction on risks of this   Eligible for discharge? Only been seeing short period of time so will continue to follow and hope for improvement with compliance  Jorge Ny, [facility] Desk#: [phone] Cell#: [phone]

## 2022-02-28 NOTE — Progress Notes (Signed)
Paramedicine Encounter    Patient ID: [name] [name], male    DOB: 21-Jan-1956, 66 y.o.   MRN: [id number]   Arrived for home visit for [name] [name] who reports feeling good but states that he has been having some GI issues over the last week, including some episodes of vomiting and diarrhea. He denied needing follow up for same. I obtained vitals and they are as noted.   WT- 196lbs BP- 110/70 HR- 70 RR- 16 O2- 96% CBG- 256   Meds reviewed and confirmed. He has missed several doses of evening meds over the last two weeks. He reports this is due to him forgetting. I offered to set up an alarm for reminders and he did not want to. I educated him on the importance of med compliance and he verbalized understanding.   Refills called into [facility]: Isosorbide Jardiance  I established an appointment with [facility] with Dr. Dwyane Dee in October.   He was concerned that he no longer has Medicaid- [place] checked [employer] and he does not have medicaid noted.  I will make him and his daughter aware of this and that he will need to re-apply.   He was denied [employer]- he says he will plan to pay out of pocket for this which is $45 per month. I advised him to let me know if he has any issues going forward for affording this.   Pill box filled for two weeks. We reviewed all appointments. He had no other concerns at this time.   Home visit complete. I will follow up in two weeks, he knows to reach out.   Salena Saner, [place] 02/28/2022       Patient Care Team: [name], MD as PCP - General (Family Medicine) Larey Dresser, MD as PCP - Cardiology (Cardiology) Dimitri Ped, RN as Case Manager  Patient Active Problem List   Diagnosis Date Noted   [place] (type) I [facility]) atrioventricular block 01/23/2022   Chest pain of uncertain etiology 01/75/1025   Syncope 02/09/2020   Generalized weakness 06/27/2019   Acute on  chronic congestive heart failure with left ventricular diastolic dysfunction ([place]) 06/26/2019   Anemia due to chronic kidney disease 06/26/2019   CKD (chronic kidney disease), stage III ([place]) 06/26/2019   Type 2 diabetes mellitus without complication ([place]) 85/27/7824   Iron deficiency anemia due to chronic blood loss 06/26/2019   History of atrial fibrillation    History of type 2 diabetes mellitus    Anemia    Chest pain 04/24/2019   Overweight (BMI 25.0-29.9) 02/28/2017   Unstable angina ([place]) 08/03/2016   S/P laparoscopic cholecystectomy 02/07/2016   Coronary artery disease involving native coronary artery with unstable angina pectoris ([place]) 12/29/2015   Diabetes ([place]) 11/12/2015   Chronic anticoagulation 08/22/2015   Atrial flutter with controlled response ([place]) 08/22/2015   RUQ pain    Cholelithiases- drain in place 06/04/2015   Essential hypertension    Chronic diastolic CHF (congestive heart failure) ([place]) 04/24/2015   Excessive daytime sleepiness 11/25/2014   Obstructive sleep apnea 11/25/2014   NICM (nonischemic cardiomyopathy) ([place]) 03/11/2014   Anticoagulation goal of INR 2 to 3 10/19/2013   Eczema 10/19/2013   Low libido 10/16/2013   Persistent atrial fibrillation ([place]) 10/06/2013   Rectal bleeding 12/18/2011   HYPERCHOLESTEROLEMIA 07/11/2010   DENTAL CARIES 06/30/2010   GOUT 05/05/2009   HEPATITIS B, CHRONIC 12/30/2006   OSTEOCHONDROMA 12/30/2006   Mitral valve disorder 12/30/2006   LIVER  FUNCTION TESTS, ABNORMAL, HX OF 12/30/2006   COLONIC POLYPS, HX OF 12/30/2006   COLONOSCOPY, HX OF 05/12/2001    Current Outpatient Medications:    ACCU-CHEK SOFTCLIX LANCETS lancets, Use as instructed for three times daily testing of blood glucose, Disp: 100 each, Rfl: 12   acetaminophen (TYLENOL) 500 MG tablet, Take 1,000 mg by mouth every 6 (six) hours as needed (pain)., Disp: , Rfl:    albuterol (VENTOLIN HFA) 108 (90 Base) MCG/ACT inhaler, Inhale 1-2 puffs into the lungs  every 6 (six) hours as needed for wheezing or shortness of breath., Disp: 8 g, Rfl: 0   apixaban (ELIQUIS) 5 MG TABS tablet, Take 1 tablet (5 mg total) by mouth 2 (two) times daily., Disp: 60 tablet, Rfl: 6   atorvastatin (LIPITOR) 80 MG tablet, Take 1 tablet by mouth once daily, Disp: 90 tablet, Rfl: 3   blood glucose meter kit and supplies KIT, Dispense based on patient and insurance preference. Use up to four times daily as directed. (FOR ICD-9 250.00, 250.01)., Disp: 1 each, Rfl: 0   Blood Glucose Monitoring Suppl (ACCU-CHEK AVIVA PLUS) w/Device KIT, Use as directed for 3 times daily testing of blood glucose, Disp: 1 kit, Rfl: 0   carvedilol (COREG) 12.5 MG tablet, Take 1 tablet (12.5 mg total) by mouth 2 (two) times daily with a meal., Disp: 60 tablet, Rfl: 2   colchicine 0.6 MG tablet, On day 1 take 2 tablets at first sign of gout flare.  Take 1 tablet 1 hour later.  Then take 1 tablet daily starting the next day until resolution of gout flare., Disp: 10 tablet, Rfl: 1   Continuous Blood Gluc Receiver (FREESTYLE LIBRE 2 READER) DEVI, Check your blood sugar three times per day before meals and once before bedtime, Disp: 2 each, Rfl: 11   Continuous Blood Gluc Sensor (FREESTYLE LIBRE 2 SENSOR) MISC, Apply one sensor to upper arm every 2 weeks, Disp: 2 each, Rfl: 2   empagliflozin (JARDIANCE) 10 MG TABS tablet, Take 1 tablet (10 mg total) by mouth daily before breakfast., Disp: 30 tablet, Rfl: 11   eplerenone (INSPRA) 25 MG tablet, Take 1 tablet (25 mg total) by mouth daily., Disp: 30 tablet, Rfl: 11   glucose blood (ACCU-CHEK AVIVA PLUS) test strip, Use as instructed for 3 times daily testing of blood glucose, Disp: 100 each, Rfl: 12   hydrALAZINE (APRESOLINE) 50 MG tablet, TAKE 1 & 1/2 (ONE & ONE-HALF) TABLETS BY MOUTH TWICE DAILY ., Disp: 250 tablet, Rfl: 3   insulin glargine (LANTUS SOLOSTAR) 100 UNIT/ML Solostar Pen, Inject 40 Units into the skin every morning. And pen needles 1/day, Disp: 45  mL, Rfl: 3   Insulin Pen Needle (PEN NEEDLES) 32G X 4 MM MISC, 1 each by Does not apply route daily., Disp: 100 each, Rfl: 3   isosorbide mononitrate (IMDUR) 120 MG 24 hr tablet, Take 1 tablet (120 mg total) by mouth daily., Disp: 30 tablet, Rfl: 6   Lancet Devices (ACCU-CHEK SOFTCLIX) lancets, Use as instructed for 3 times daily testing of blood glucose, Disp: 1 each, Rfl: 0   metFORMIN (GLUCOPHAGE) 1000 MG tablet, Take 1,000 mg by mouth daily with breakfast., Disp: 30 tablet, Rfl: 0   nitroGLYCERIN (NITROSTAT) 0.4 MG SL tablet, Place 1 tablet (0.4 mg total) under the tongue every 5 (five) minutes as needed for chest pain (x 3 tabs daily)., Disp: 60 tablet, Rfl: 1   sotalol (BETAPACE) 120 MG tablet, Take 1 tablet (120 mg total) by  mouth 2 (two) times daily., Disp: 180 tablet, Rfl: 3   torsemide (DEMADEX) 20 MG tablet, Take 80 mg by mouth 2 (two) times daily., Disp: , Rfl:    TRUEPLUS LANCETS 28G MISC, 1 each by Does not apply route 3 (three) times daily., Disp: 100 each, Rfl: 12 Allergies  Allergen Reactions   Shrimp [Shellfish Allergy] Nausea And Vomiting   Ace Inhibitors Cough   Other Hives and Other (See Comments)    Patient reports developing hives after receiving "some antibiotic given in 1980''s at [facility]". He does not know which antibiotic.     Social History   Socioeconomic History   Marital status: Widowed    Spouse name: Not on file   Number of children: 2   Years of education: 14   Highest education level: Some college, no degree  Occupational History    Employer: UNEMPLOYED  Tobacco Use   Smoking status: Former    Packs/day: 4.00    Years: 18.00    Total pack years: 72.00    Types: Cigarettes    Quit date: 08/14/1983    Years since quitting: 38.5   Smokeless tobacco: Never  Vaping Use   Vaping Use: Never used  Substance and Sexual Activity   Alcohol use: Yes    Alcohol/week: 16.0 standard drinks of alcohol    Types: 6 Cans of beer, 10 Shots of liquor  per week    Comment: 2-3 beers watching sports; occasional glass of liquor; drinks heavily in football season   Drug use: Yes    Types: Marijuana    Comment: twice a week   Sexual activity: Never  Other Topics Concern   Not on file  Social History Narrative   Not on file   Social Determinants of Health   Financial Resource Strain: Low Risk  (07/19/2021)   Overall Financial Resource Strain (CARDIA)    Difficulty of Paying Living Expenses: Not hard at all  Food Insecurity: Food Insecurity Present (12/11/2021)   Hunger Vital Sign    Worried About Running Out of Food in the Last Year: Sometimes true    Ran Out of Food in the Last Year: Never true  Transportation Needs: No Transportation Needs (12/11/2021)   PRAPARE - Hydrologist (Medical): No    Lack of Transportation (Non-Medical): No  Physical Activity: Insufficiently Active (07/19/2021)   Exercise Vital Sign    Days of Exercise per Week: 1 day    Minutes of Exercise per Session: 10 min  Stress: No Stress Concern Present (07/19/2021)   [place]    Feeling of Stress : Not at all  Social Connections: Socially Isolated (07/19/2021)   Social Connection and Isolation Panel [NHANES]    Frequency of Communication with Friends and Family: More than three times a week    Frequency of Social Gatherings with Friends and Family: More than three times a week    Attends Religious Services: Never    Marine scientist or Organizations: No    Attends Archivist Meetings: Never    Marital Status: Widowed  Intimate Partner Violence: Not At Risk (07/19/2021)   Humiliation, Afraid, Rape, and Kick questionnaire    Fear of Current or Ex-Partner: No    Emotionally Abused: No    Physically Abused: No    Sexually Abused: No    Physical Exam      Future Appointments  Date Time Provider  [place]  03/14/2022 11:40 AM Larey Dresser,  MD [facility] None  03/23/2022 11:30 AM [facility] CARE [facility]  07/23/2022  1:45 PM [facility]-NURSE HEALTH ADVISOR [facility]     ACTION: Home visit completed

## 2022-03-12 DIAGNOSIS — E1165 Type 2 diabetes mellitus with hyperglycemia: Secondary | ICD-10-CM

## 2022-03-12 DIAGNOSIS — I5032 Chronic diastolic (congestive) heart failure: Secondary | ICD-10-CM | POA: Diagnosis not present

## 2022-03-12 DIAGNOSIS — I4891 Unspecified atrial fibrillation: Secondary | ICD-10-CM

## 2022-03-12 DIAGNOSIS — I10 Essential (primary) hypertension: Secondary | ICD-10-CM | POA: Diagnosis not present

## 2022-03-12 DIAGNOSIS — E7800 Pure hypercholesterolemia, unspecified: Secondary | ICD-10-CM

## 2022-03-13 ENCOUNTER — Telehealth (HOSPITAL_COMMUNITY): Payer: Self-pay

## 2022-03-13 NOTE — Telephone Encounter (Signed)
Called to confirm clinic visit with [name] tomorrow at 1140. He agreed and plans to bring all meds and pill box. I will meet him there. Call complete.    Salena Saner, [place] 03/13/2022

## 2022-03-14 ENCOUNTER — Encounter (HOSPITAL_COMMUNITY): Payer: Self-pay | Admitting: Cardiology

## 2022-03-14 ENCOUNTER — Ambulatory Visit (HOSPITAL_COMMUNITY)
Admission: RE | Admit: 2022-03-14 | Discharge: 2022-03-14 | Disposition: A | Discharge status: Home / Self Care | Payer: Medicare Other | Source: Ambulatory Visit | Attending: Cardiology | Admitting: Cardiology

## 2022-03-14 ENCOUNTER — Other Ambulatory Visit (HOSPITAL_COMMUNITY): Payer: Self-pay

## 2022-03-14 VITALS — BP 130/68 | HR 84 | Wt 202.0 lb

## 2022-03-14 DIAGNOSIS — I2510 Atherosclerotic heart disease of native coronary artery without angina pectoris: Secondary | ICD-10-CM | POA: Diagnosis not present

## 2022-03-14 DIAGNOSIS — I130 Hypertensive heart and chronic kidney disease with heart failure and stage 1 through stage 4 chronic kidney disease, or unspecified chronic kidney disease: Secondary | ICD-10-CM | POA: Diagnosis not present

## 2022-03-14 DIAGNOSIS — I4892 Unspecified atrial flutter: Secondary | ICD-10-CM | POA: Insufficient documentation

## 2022-03-14 DIAGNOSIS — I5022 Chronic systolic (congestive) heart failure: Secondary | ICD-10-CM

## 2022-03-14 DIAGNOSIS — Z7984 Long term (current) use of oral hypoglycemic drugs: Secondary | ICD-10-CM | POA: Diagnosis not present

## 2022-03-14 DIAGNOSIS — Z79899 Other long term (current) drug therapy: Secondary | ICD-10-CM | POA: Diagnosis not present

## 2022-03-14 DIAGNOSIS — Z91199 Patient's noncompliance with other medical treatment and regimen due to unspecified reason: Secondary | ICD-10-CM | POA: Insufficient documentation

## 2022-03-14 DIAGNOSIS — R079 Chest pain, unspecified: Secondary | ICD-10-CM | POA: Insufficient documentation

## 2022-03-14 DIAGNOSIS — Z7901 Long term (current) use of anticoagulants: Secondary | ICD-10-CM | POA: Insufficient documentation

## 2022-03-14 DIAGNOSIS — I5032 Chronic diastolic (congestive) heart failure: Secondary | ICD-10-CM

## 2022-03-14 DIAGNOSIS — E785 Hyperlipidemia, unspecified: Secondary | ICD-10-CM | POA: Diagnosis not present

## 2022-03-14 DIAGNOSIS — I25119 Atherosclerotic heart disease of native coronary artery with unspecified angina pectoris: Secondary | ICD-10-CM | POA: Diagnosis not present

## 2022-03-14 DIAGNOSIS — N189 Chronic kidney disease, unspecified: Secondary | ICD-10-CM | POA: Insufficient documentation

## 2022-03-14 DIAGNOSIS — Z8616 Personal history of COVID-19: Secondary | ICD-10-CM | POA: Diagnosis not present

## 2022-03-14 DIAGNOSIS — Z955 Presence of coronary angioplasty implant and graft: Secondary | ICD-10-CM | POA: Diagnosis not present

## 2022-03-14 DIAGNOSIS — E1122 Type 2 diabetes mellitus with diabetic chronic kidney disease: Secondary | ICD-10-CM | POA: Diagnosis not present

## 2022-03-14 DIAGNOSIS — Z794 Long term (current) use of insulin: Secondary | ICD-10-CM | POA: Insufficient documentation

## 2022-03-14 DIAGNOSIS — G4733 Obstructive sleep apnea (adult) (pediatric): Secondary | ICD-10-CM | POA: Diagnosis not present

## 2022-03-14 DIAGNOSIS — I480 Paroxysmal atrial fibrillation: Secondary | ICD-10-CM | POA: Diagnosis not present

## 2022-03-14 DIAGNOSIS — R42 Dizziness and giddiness: Secondary | ICD-10-CM | POA: Diagnosis not present

## 2022-03-14 LAB — BASIC METABOLIC PANEL
Anion gap: 13 (ref 5–15)
BUN: 14 mg/dL (ref 8–23)
CO2: 21 mmol/L — ABNORMAL LOW (ref 22–32)
Calcium: 9.1 mg/dL (ref 8.9–10.3)
Chloride: 101 mmol/L (ref 98–111)
Creatinine, Ser: 1.65 mg/dL — ABNORMAL HIGH (ref 0.61–1.24)
GFR, Estimated: 45 mL/min — ABNORMAL LOW (ref >60)
Glucose, Bld: 413 mg/dL — ABNORMAL HIGH (ref 70–99)
Potassium: 3.4 mmol/L — ABNORMAL LOW (ref 3.5–5.1)
Sodium: 135 mmol/L (ref 135–145)

## 2022-03-14 LAB — MAGNESIUM: Magnesium: 2.5 mg/dL — ABNORMAL HIGH (ref 1.7–2.4)

## 2022-03-14 LAB — BRAIN NATRIURETIC PEPTIDE: B Natriuretic Peptide: 77.7 pg/mL (ref 0.0–100.0)

## 2022-03-14 NOTE — Patient Instructions (Signed)
There has been no changes to your medications.  Labs done today, your results will be available in MyChart, we will contact you for abnormal readings.  Your physician has requested that you have a cardiac MRI. Cardiac MRI uses a computer to create images of your heart as its beating, producing both still and moving pictures of your heart and major blood vessels. For further information please visit [email or web address] Please follow the instruction sheet given to you today for more information. ONCE APPROVED BY YOUR INSURANCE COMPANY YOU WILL BE CALLED TO ARRANGE THE EXAM    Your physician recommends that you schedule a follow-up appointment in: 4 months   If you have any questions or concerns before your next appointment please send [place] a message through [place] or call our office at [phone].    TO LEAVE A MESSAGE FOR THE NURSE SELECT OPTION 2, PLEASE LEAVE A MESSAGE INCLUDING: YOUR NAME DATE OF BIRTH CALL BACK NUMBER REASON FOR CALL**this is important as we prioritize the call backs  YOU WILL RECEIVE A CALL BACK THE SAME DAY AS LONG AS YOU CALL BEFORE 4:00 PM  At the [facility], you and your health needs are our priority. As part of our continuing mission to provide you with exceptional heart care, we have created designated Provider Care Teams. These Care Teams include your primary Cardiologist (physician) and Advanced Practice Providers (APPs- Physician Assistants and Nurse Practitioners) who all work together to provide you with the care you need, when you need it.   You may see any of the following providers on your designated Care Team at your next follow up: Dr Glori Bickers Dr Haynes Kerns, NP Lyda Jester, [place] [facility] [place], [place] Audry Riles, PharmD   Please be sure to bring in all your medications bottles to every appointment.

## 2022-03-14 NOTE — Progress Notes (Signed)
Paramedicine Encounter    Patient ID: [name] [name], male    DOB: 1955/08/29, 66 y.o.   MRN: [id number]  Arrived for clinic visit for [name] [name] who was being seen by Dr. Aundra [name]. [name] [name] reports feeling okay but reports some back pain and GI upset over the last few weeks. He is being followed by PCP for this.  He has not been taking his medications as placed in his pill box, he has been able to take morning meds on most days but misses all evening doses. He says it is because he forgets. Myself and Dr. Aundra [name] encouraged him and reminded him the importance of taking all medications both times of days. He agreed. I set up alarms for medications in his phone. I reviewed meds and placed them in his pill box accordingly.   Dr. Aundra [name] made no med changed.  He obtained labs and EKG.  He wants [name] [name] to complete cardiomems readings daily and to get a cardiac MRI in the coming months and to follow up in APP clinic in 4 months.   We reviewed appointments and I gave him his morning meds while in clinic today.   Refills as noted: -[facility] -Pen Needles   I plan to see [name] [name] in two weeks, I set up pill box for two weeks.   [name] [name] know to reach out if needed in the mean time. Clinic visit complete.   [name] [name], [place] 03/14/2022     Patient Care Team: [name] Ruddy, MD as PCP - General (Family Medicine) Larey Dresser, MD as PCP - Cardiology (Cardiology) [name] Ped, RN as Case Manager  Patient Active Problem List   Diagnosis Date Noted   [place] (type) I [facility]) atrioventricular block 01/23/2022   Chest pain of uncertain etiology 47/04/6282   Syncope 02/09/2020   Generalized weakness 06/27/2019   Acute on chronic congestive heart failure with left ventricular diastolic dysfunction ([place]) 06/26/2019   Anemia due to chronic kidney disease 06/26/2019   CKD (chronic kidney disease), stage III ([place]) 06/26/2019   Type 2 diabetes mellitus without  complication ([place]) 66/29/4765   Iron deficiency anemia due to chronic blood loss 06/26/2019   History of atrial fibrillation    History of type 2 diabetes mellitus    Anemia    Chest pain 04/24/2019   Overweight (BMI 25.0-29.9) 02/28/2017   Unstable angina ([place]) 08/03/2016   S/P laparoscopic cholecystectomy 02/07/2016   Coronary artery disease involving native coronary artery with unstable angina pectoris ([place]) 12/29/2015   Diabetes ([place]) 11/12/2015   Chronic anticoagulation 08/22/2015   Atrial flutter with controlled response ([place]) 08/22/2015   RUQ pain    Cholelithiases- drain in place 06/04/2015   Essential hypertension    Chronic diastolic CHF (congestive heart failure) ([place]) 04/24/2015   Excessive daytime sleepiness 11/25/2014   Obstructive sleep apnea 11/25/2014   NICM (nonischemic cardiomyopathy) ([place]) 03/11/2014   Anticoagulation goal of INR 2 to 3 10/19/2013   Eczema 10/19/2013   Low libido 10/16/2013   Persistent atrial fibrillation ([place]) 10/06/2013   Rectal bleeding 12/18/2011   HYPERCHOLESTEROLEMIA 07/11/2010   DENTAL CARIES 06/30/2010   GOUT 05/05/2009   HEPATITIS B, CHRONIC 12/30/2006   OSTEOCHONDROMA 12/30/2006   Mitral valve disorder 12/30/2006   LIVER FUNCTION TESTS, ABNORMAL, HX OF 12/30/2006   COLONIC POLYPS, HX OF 12/30/2006   COLONOSCOPY, HX OF 05/12/2001    Current Outpatient Medications:    ACCU-CHEK SOFTCLIX LANCETS lancets, Use as instructed for three times daily testing of blood  glucose, Disp: 100 each, Rfl: 12   acetaminophen (TYLENOL) 500 MG tablet, Take 1,000 mg by mouth every 6 (six) hours as needed (pain)., Disp: , Rfl:    albuterol (VENTOLIN HFA) 108 (90 Base) MCG/ACT inhaler, Inhale 1-2 puffs into the lungs every 6 (six) hours as needed for wheezing or shortness of breath., Disp: 8 g, Rfl: 0   apixaban (ELIQUIS) 5 MG TABS tablet, Take 1 tablet (5 mg total) by mouth 2 (two) times daily., Disp: 60 tablet, Rfl: 6   atorvastatin (LIPITOR) 80 MG  tablet, Take 1 tablet by mouth once daily, Disp: 90 tablet, Rfl: 3   blood glucose meter kit and supplies KIT, Dispense based on patient and insurance preference. Use up to four times daily as directed. (FOR ICD-9 250.00, 250.01)., Disp: 1 each, Rfl: 0   Blood Glucose Monitoring Suppl (ACCU-CHEK AVIVA PLUS) w/Device KIT, Use as directed for 3 times daily testing of blood glucose, Disp: 1 kit, Rfl: 0   carvedilol (COREG) 12.5 MG tablet, Take 1 tablet (12.5 mg total) by mouth 2 (two) times daily with a meal., Disp: 60 tablet, Rfl: 2   colchicine 0.6 MG tablet, On day 1 take 2 tablets at first sign of gout flare.  Take 1 tablet 1 hour later.  Then take 1 tablet daily starting the next day until resolution of gout flare., Disp: 10 tablet, Rfl: 1   Continuous Blood Gluc Receiver (FREESTYLE LIBRE 2 READER) DEVI, Check your blood sugar three times per day before meals and once before bedtime, Disp: 2 each, Rfl: 11   Continuous Blood Gluc Sensor (FREESTYLE LIBRE 2 SENSOR) MISC, Apply one sensor to upper arm every 2 weeks, Disp: 2 each, Rfl: 2   empagliflozin (JARDIANCE) 10 MG TABS tablet, Take 1 tablet (10 mg total) by mouth daily before breakfast., Disp: 30 tablet, Rfl: 11   eplerenone (INSPRA) 25 MG tablet, Take 1 tablet (25 mg total) by mouth daily., Disp: 30 tablet, Rfl: 11   glucose blood (ACCU-CHEK AVIVA PLUS) test strip, Use as instructed for 3 times daily testing of blood glucose, Disp: 100 each, Rfl: 12   hydrALAZINE (APRESOLINE) 50 MG tablet, TAKE 1 & 1/2 (ONE & ONE-HALF) TABLETS BY MOUTH TWICE DAILY ., Disp: 250 tablet, Rfl: 3   insulin glargine (LANTUS SOLOSTAR) 100 UNIT/ML Solostar Pen, Inject 40 Units into the skin every morning. And pen needles 1/day, Disp: 45 mL, Rfl: 3   Insulin Pen Needle (PEN NEEDLES) 32G X 4 MM MISC, 1 each by Does not apply route daily., Disp: 100 each, Rfl: 3   isosorbide mononitrate (IMDUR) 120 MG 24 hr tablet, Take 1 tablet (120 mg total) by mouth daily., Disp: 30  tablet, Rfl: 6   Lancet Devices (ACCU-CHEK SOFTCLIX) lancets, Use as instructed for 3 times daily testing of blood glucose, Disp: 1 each, Rfl: 0   metFORMIN (GLUCOPHAGE) 1000 MG tablet, Take 1,000 mg by mouth daily with breakfast., Disp: 30 tablet, Rfl: 0   nitroGLYCERIN (NITROSTAT) 0.4 MG SL tablet, Place 1 tablet (0.4 mg total) under the tongue every 5 (five) minutes as needed for chest pain (x 3 tabs daily)., Disp: 60 tablet, Rfl: 1   sotalol (BETAPACE) 120 MG tablet, Take 1 tablet (120 mg total) by mouth 2 (two) times daily., Disp: 180 tablet, Rfl: 3   torsemide (DEMADEX) 20 MG tablet, Take 80 mg by mouth 2 (two) times daily., Disp: , Rfl:    TRUEPLUS LANCETS 28G MISC, 1 each by Does not apply  route 3 (three) times daily., Disp: 100 each, Rfl: 12 Allergies  Allergen Reactions   Shrimp [Shellfish Allergy] Nausea And Vomiting   Ace Inhibitors Cough   Other Hives and Other (See Comments)    Patient reports developing hives after receiving "some antibiotic given in 1980''s at [facility]". He does not know which antibiotic.     Social History   Socioeconomic History   Marital status: Widowed    Spouse name: Not on file   Number of children: 2   Years of education: 14   Highest education level: Some college, no degree  Occupational History    Employer: UNEMPLOYED  Tobacco Use   Smoking status: Former    Packs/day: 4.00    Years: 18.00    Total pack years: 72.00    Types: Cigarettes    Quit date: 08/14/1983    Years since quitting: 38.6   Smokeless tobacco: Never  Vaping Use   Vaping Use: Never used  Substance and Sexual Activity   Alcohol use: Yes    Alcohol/week: 16.0 standard drinks of alcohol    Types: 6 Cans of beer, 10 Shots of liquor per week    Comment: 2-3 beers watching sports; occasional glass of liquor; drinks heavily in football season   Drug use: Yes    Types: Marijuana    Comment: twice a week   Sexual activity: Never  Other Topics Concern   Not on  file  Social History Narrative   Not on file   Social Determinants of Health   Financial Resource Strain: Low Risk  (07/19/2021)   Overall Financial Resource Strain (CARDIA)    Difficulty of Paying Living Expenses: Not hard at all  Food Insecurity: Food Insecurity Present (12/11/2021)   Hunger Vital Sign    Worried About Running Out of Food in the Last Year: Sometimes true    Ran Out of Food in the Last Year: Never true  Transportation Needs: No Transportation Needs (12/11/2021)   PRAPARE - Hydrologist (Medical): No    Lack of Transportation (Non-Medical): No  Physical Activity: Insufficiently Active (07/19/2021)   Exercise Vital Sign    Days of Exercise per Week: 1 day    Minutes of Exercise per Session: 10 min  Stress: No Stress Concern Present (07/19/2021)   [place]    Feeling of Stress : Not at all  Social Connections: Socially Isolated (07/19/2021)   Social Connection and Isolation Panel [NHANES]    Frequency of Communication with Friends and Family: More than three times a week    Frequency of Social Gatherings with Friends and Family: More than three times a week    Attends Religious Services: Never    Marine scientist or Organizations: No    Attends Archivist Meetings: Never    Marital Status: Widowed  Intimate Partner Violence: Not At Risk (07/19/2021)   Humiliation, Afraid, Rape, and Kick questionnaire    Fear of Current or Ex-Partner: No    Emotionally Abused: No    Physically Abused: No    Sexually Abused: No    Physical Exam      Future Appointments  Date Time Provider [place]  03/23/2022 11:30 AM [facility]  05/14/2022 10:30 AM [facility] LAB [facility] None  05/16/2022 11:00 AM Elayne Snare, MD [facility] None  07/18/2022 11:00 AM [facility] PA/NP [facility] None  07/23/2022  1:45 PM  [place] ADVISOR [facility]      ACTION: Home visit completed

## 2022-03-15 ENCOUNTER — Telehealth (HOSPITAL_COMMUNITY): Payer: Self-pay | Admitting: Surgery

## 2022-03-15 MED ORDER — POTASSIUM CHLORIDE CRYS ER 20 MEQ PO TBCR: 20.0000 meq | EXTENDED_RELEASE_TABLET | Freq: Every day | ORAL | 6 refills | Status: DC

## 2022-03-15 NOTE — Telephone Encounter (Signed)
I called patient regarding results and recommendations per provider. Patient not taking potassium currently.  I have added to medlist in [facility] Potassium 20 meq daily.  Patient aware and agreeable.

## 2022-03-15 NOTE — Telephone Encounter (Signed)
-----   Message from Larey Dresser, MD sent at 03/15/2022  1:00 PM EDT ----- Increase total daily KCl dose by 20 mEq.

## 2022-03-15 NOTE — Progress Notes (Signed)
Patient ID: [name] [name], male   DOB: 06-04-1956, 66 y.o.   MRN: [id number] PCP: [name], MD HF Cardiology: Dr. Aundra Dubin  66 y.o. with history of HTN, diabetes, paroxysmal atrial fibrillation, and CAD presents for cardiology followup.  He was admitted in 5/13 with chest pain with exertion.  LHC was done showing global HK with EF 45% and diffuse, severe distal and branch vessel disease.  There was no interventional option, but it was suspect that this disease could be causing his anginal-type pain.  Echo at that time was read as showing EF 60-65%.  He was admitted in 5/14 with hypertensive urgency and chest pain.  He ruled out for MI and BP was controlled.  His EF was 50-55% by echo.  ETT-Sestamibi done as outpatient showed small fixed inferior defect with no ischemia but EF was 31%.  He was quite hypertensive during the study.  Cardiac MRI done to confirm EF in 5/14 showed EF 44%.  He had an episode of atrial fibrillation/RVR in 3/15 but went back into NSR.  Echo in 2/16 showed EF 55-60% with moderate diastolic dysfunction.   He had a Cardiolite in 9/16 showing inferior ischemia.  He was having exertional dyspnea with less activity than normal.  There was concern for worsened CAD and he was sent for [facility] in 9/16. By RHC, filling pressures were not significantly elevated. He had a severe distal RCA stenosis that was treated with DES x 2.  Echo showed EF 55-60%.    In 10/16, he was admitted with acute cholecystitis.  He received a cholecystostomy tube.  Cholecystectomy done in 6/17.   Atrial fibrillation in 1/17, cardioverted to NSR.   Recurrent concerning chest pain in 3/17.  Repeat LHC showed diffuse distal and branch vessel disease consistent with poorly-controlled DM, but patent RCA stents.    He was admitted with atrial fibrillation in 12/17. Started on sotalol and converted to NSR.    He had recurrent chest pain in 5/18 with abnormal Cardiolite.  Cath was done again in 5/18,  showing diffuse CAD with no good interventional options.   Admitted 11/20 with increased dyspnea and exertional chest pain. Hgb went down to 8.1. GI consulted.  Had [place] that showed severe distal and branch vessel disease but no interventional targets and not good anatomy for CABG. Continued on medical management. Capsule endoscopy without recent or active bleeding, no AVMs. He went into atrial flutter during his hospitalization. He went back into NSR, however, after discharge.  +Snoring and daytime sleepiness.   He has had episodes of syncope associated with vigorous coughing.   He was in the ER with atrial flutter and dyspnea in 4/21. Zio monitor in 9/22 showed NSR with PACs.   Follow up 1/23 not compliant with medications or Cardiomems.   Follow up 5/23, continued to miss medications, not using Cardiomems. Mild volume overload, instructed to restart meds.  Evaluated in ED 01/14/22 for cough, found to be COVID +. Received molnupiravir.  Admitted 01/22/22 with CP. Cards consulted, troponin negative x 2, Echo showed EF 55-60%, severe asymmetric LV hypertrophy, normal RV. No new WMA and CP resolved, so LHC deferred.  Coreg decreased to 12.5 bid due to low BP and [place]itz Type 1. Discharged home, weight 200 lbs.   Today he returns for followup of CHF and CAD.  Now has paramedicine but still having trouble taking his evening meds.  Not using Cardiomems.  Says that he is having difficulty using his CPAP mask  since some of the parts were changed.  No dyspnea walking on flat ground. Occasional "twinges" of chest pain, not exertional and last for seconds only.  Occasional lightheadedness if he stands too fast. Weight down 4 lbs.   ECG (personally reviewed): NSR, 1st degree AVB, PACs, QTc 502 msec  Cardiomems: No readings x months.  Labs (5/13): K 3.6, creatinine 0.93, LDL 85, HDL 57 Labs (7/13): K 3.9, creatinine 1.1, LDL 57, HDL 51  Labs (5/14): K 4, creatinine 1.25 Labs (8/14): K 3.7, creatinine 0.9,  LDL 103, HDL 42 Labs (8/15): K 3.5 creatinine 1.0, BNP 75 Labs (2/16): K 3.8, creatinine 1.1 Labs (9/16): K 3.5, creatinine 1.03, BNP 59, HDL 40, LDL 111, TGs 333 Labs (11/16): K 3.6, creaitnine 1.07, LFTs normal, LDL 44 Labs (6/17): K 3.4, creatinine 1.52 => 1.13 Labs (1/18): K 3.9, creatinine 1.25 Labs (6/18): K 3.8, creatinine 1.21, HCT 39.7 Labs (8/18): LDL 39, HDL 43 Labs (12/18): 4.4, creatinine 1.25, LFTs normal Labs (8/19): K 3.6, creatinine 1.13, LDL 68, HDL 49  Labs (11/20): K 3.5, creatinine 1.33, LDL 25 Labs (4/21): K 4.0, SCr 1.14, BNP 266  Labs (6/21): K 3.7, creatinine 1.25 Labs (12/21): LDL 25 Labs (1/22): BNP 132 Labs (2/22): K 3.4, creatinine 1.44 Labs (8/22): LDL 81 Labs (9/22): K 3.2, creatinine 1.53 Labs (10/22): K 3.7, creatinine 1.39 Labs (11/22): K 4.4, creatinine 1.47 Labs (3/23): K 4.4, creatinine 1.44 Labs (5/23): K 4.2, creatinine 1.78 Labs (6/23): K 4.1, creatinine 1.24, hgb 15.8 Labs (7/23): K 4.1, creatinine 1.55  PMH: 1. DM2 2. Gout 3. HTN: Cough with ACEI.  4. Cardiomyopathy: Suspect mixed ischemic/nonischemic (?ETOH-related).  EF 35% in 2008.  Echo in 5/13 showed EF 60-65% with moderate LVH but EF was 45% on LV-gram in 5/13.  Echo (5/14) with EF 50-55%, mild LVH, inferobasal HK, mild MR.  ETT-Sestamibi in 5/14, however, showed EF 31%.  Cardiac MRI (6/14) with EF 44%, mild global hypokinesis, subepicardial delayed enhancement in a nonspecific RV insertion site pattern.  Echo (2/16) with EF 55-60%, moderate diastolic dysfunction. RHC (9/16) with mean RA 8, PA 39/20 mean 28, mean PCWP 14, CI 2.2. Echo (9/16) with EF 55-60%, grade II diastolic dysfunction.  - Echo (6/17) with EF 55-60%, moderate LVH, grade II diastolic dysfunction, mild AI, mild MR.  - RHC (11/20): mean RA 4, PA 46/14 mean 26, mean PCWP 13, CI 3.23 - Echo (9/20): EF 60-65%, moderate LVH, normal RV size and systolic function, mild-moderate MR, mild AI.  - Echo (5/21): EF 55%, RV  normal - Has Cardiomems.  - Echo (6/23): EF 55-60%, severe asymmetric LV hypertrophy, normal RV. 5. CAD: LHC in 2010 with mild nonobstructive disease.  LHC (5/13) with diffuse distal and branch vessel disease, mild global hypokinesis and EF 45%.  ETT-Sestamibi (5/14): EF 31%, small fixed inferior defect with no ischemia.  Lexiscan Cardiolite (9/16) with EF 26%, moderate inferior defect that was partially reversible, suggesting ischemia => High risk study. LHC (9/16) with 40-50% mLAD, diffuse up to 50% distal LAD, 90% ostium of branch off ramus, 95% dRCA => DES to RCA x 2, EF 55-60%.   - LHC (3/17) with diffuse branch and distal vessel disease c/w poorly controlled DM, RCA stents patent.  - LHC (5/18): Diffuse CAD with no good interventional option. 60% mRCA, 90% dLAD, 50% serial mid ramus stenoses, ramus branches with ostial 80-90% stenoses. - LHC (11/20): Severe distal and branch vessel disease but no interventional targets and not good anatomy for  CABG  6. H/o chronic HBV 7. Osteochondroma left shoulder.  8. Hyperlipidemia 9. Paroxysmal atrial fibrillation: Coumadin.  Developed cough and increased ESR with amiodarone.  DCCV in 1/17.  Recurrent atrial fibrillation in 12/17, sotalol started.  10. Atrial flutter: had ablations in 2007 and 2008.  - Zio patch in 4/21 with 20% atrial flutter.  11. OSA on CPAP.  12. Acute cholecystitis (10/16): Cholecystostomy tube placed.  Cholecystectomy 6/17.  13. CKD 14. Anemia - Suspected GI bleed but EGD/c-scope 9/20 without significant findings.  - Capsule Endoscopy (11/20) without recent or active bleeding, no AVMs.  15. Syncope: Suspected cough syncope.  16. OSA  SH: Married, prior smoker.  Some ETOH, occasionally heavy in past.  Does use occasional marijuana. Out of work Engineer, drilling. 2 daughters in grad school.   FH: CAD  ROS: All systems reviewed and negative except as per HPI.   Current Outpatient Medications  Medication Sig Dispense Refill    ACCU-CHEK SOFTCLIX LANCETS lancets Use as instructed for three times daily testing of blood glucose 100 each 12   acetaminophen (TYLENOL) 500 MG tablet Take 1,000 mg by mouth every 6 (six) hours as needed (pain).     albuterol (VENTOLIN HFA) 108 (90 Base) MCG/ACT inhaler Inhale 1-2 puffs into the lungs every 6 (six) hours as needed for wheezing or shortness of breath. 8 g 0   apixaban (ELIQUIS) 5 MG TABS tablet Take 1 tablet (5 mg total) by mouth 2 (two) times daily. 60 tablet 6   atorvastatin (LIPITOR) 80 MG tablet Take 1 tablet by mouth once daily 90 tablet 3   blood glucose meter kit and supplies KIT Dispense based on patient and insurance preference. Use up to four times daily as directed. (FOR ICD-9 250.00, 250.01). 1 each 0   Blood Glucose Monitoring Suppl (ACCU-CHEK AVIVA PLUS) w/Device KIT Use as directed for 3 times daily testing of blood glucose 1 kit 0   carvedilol (COREG) 12.5 MG tablet Take 1 tablet (12.5 mg total) by mouth 2 (two) times daily with a meal. 60 tablet 2   colchicine 0.6 MG tablet On day 1 take 2 tablets at first sign of gout flare.  Take 1 tablet 1 hour later.  Then take 1 tablet daily starting the next day until resolution of gout flare. 10 tablet 1   Continuous Blood Gluc Receiver (FREESTYLE LIBRE 2 READER) DEVI Check your blood sugar three times per day before meals and once before bedtime 2 each 11   Continuous Blood Gluc Sensor (FREESTYLE LIBRE 2 SENSOR) MISC Apply one sensor to upper arm every 2 weeks 2 each 2   empagliflozin (JARDIANCE) 10 MG TABS tablet Take 1 tablet (10 mg total) by mouth daily before breakfast. 30 tablet 11   eplerenone (INSPRA) 25 MG tablet Take 1 tablet (25 mg total) by mouth daily. 30 tablet 11   glucose blood (ACCU-CHEK AVIVA PLUS) test strip Use as instructed for 3 times daily testing of blood glucose 100 each 12   hydrALAZINE (APRESOLINE) 50 MG tablet TAKE 1 & 1/2 (ONE & ONE-HALF) TABLETS BY MOUTH TWICE DAILY . 250 tablet 3   insulin  glargine (LANTUS SOLOSTAR) 100 UNIT/ML Solostar Pen Inject 40 Units into the skin every morning. And pen needles 1/day 45 mL 3   Insulin Pen Needle (PEN NEEDLES) 32G X 4 MM MISC 1 each by Does not apply route daily. 100 each 3   isosorbide mononitrate (IMDUR) 120 MG 24 hr tablet Take 1 tablet (120 mg  total) by mouth daily. 30 tablet 6   Lancet Devices (ACCU-CHEK SOFTCLIX) lancets Use as instructed for 3 times daily testing of blood glucose 1 each 0   metFORMIN (GLUCOPHAGE) 1000 MG tablet Take 1,000 mg by mouth daily with breakfast. 30 tablet 0   nitroGLYCERIN (NITROSTAT) 0.4 MG SL tablet Place 1 tablet (0.4 mg total) under the tongue every 5 (five) minutes as needed for chest pain (x 3 tabs daily). 60 tablet 1   sotalol (BETAPACE) 120 MG tablet Take 1 tablet (120 mg total) by mouth 2 (two) times daily. 180 tablet 3   torsemide (DEMADEX) 20 MG tablet Take 80 mg by mouth 2 (two) times daily.     TRUEPLUS LANCETS 28G MISC 1 each by Does not apply route 3 (three) times daily. 100 each 12   potassium chloride SA (KLOR-CON M) 20 MEQ tablet Take 1 tablet (20 mEq total) by mouth daily. 30 tablet 6   No current facility-administered medications for this encounter.   BP 130/68   Pulse 84   Wt 91.6 kg (202 lb)   SpO2 97%   BMI 27.40 kg/m    Wt Readings from Last 3 Encounters:  03/14/22 91.6 kg (202 lb)  02/28/22 88.9 kg (196 lb)  02/19/22 92.7 kg (204 lb 6.4 oz)   PHYSICAL EXAM: General: NAD Neck: No JVD, no thyromegaly or thyroid nodule.  Lungs: Clear to auscultation bilaterally with normal respiratory effort. CV: Nondisplaced PMI.  Heart regular S1/S2, no S3/S4, no murmur.  No peripheral edema.  No carotid bruit.  Normal pedal pulses.  Abdomen: Soft, nontender, no hepatosplenomegaly, no distention.  Skin: Intact without lesions or rashes.  Neurologic: Alert and oriented x 3.  Psych: Normal affect. Extremities: No clubbing or cyanosis.  HEENT: Normal.   Assessment/Plan: 1. Chronic  diastolic CHF: EF 16% on cardiac MRI in 6/14 but EF improved back to 55-60% on 2/16 echo.  However, EF down to 26% on Cardiolite in 9/16.  Echo in 9/20 showed EF 60-65%, moderate LVH, normal RV, mild-moderate MR.  Echo in 5/21 with EF 55%, normal RV.  He has a Cardiomems device, but has not used in months. He does not appear volume overloaded on exam and weight is down. Echo in 6/23 showed EF 55-60% with severe asymmetric septal hypertrophy.  Has paramedicine following but still not compliant with meds. NYHA class II symptoms. We discussed importance of taking his evening meds.  - Continue eplerenone 25 mg daily (gynecomastia with spiro in past). BMET/BNP today. - Continue torsemide 80 mg bid.  - Continue empagliflozin 10 mg daily.  - Continue hydralazine 75 mg bid (take the pm dose) + Imdur 120 mg daily. - Continue carvedilol 12.5 mg bid (take the pm dose).  - With asymmetric septal hypertrophy, I will get cardiac MRI to assess for evidence for hypertrophic cardiomyopathy.  - I encouraged him to send Cardiomems at least 2-3 times/week, he says he will try.   - Paramedicine now on board, appreciate their assistance. 2. Atrial fibrillation/flutter: Paroxysmal. He has had atrial flutter ablations x 2 remotely.  He is on sotalol. He tolerates atrial arrhythmias poorly, seems to worsen CHF.  Zio patch in 4/21 with 20% atrial flutter. He saw EP, repeat ablation was not recommended. Repeat Zio in 9/22 showed only NSR and PADs.   - Continue sotalol, QTc is prolonged at 502 msec but will not change sotalol.  Check K and Mg today.   - Continue Eliquis.  3. Coronary artery disease:  LHC in 9/16 showed diffuse CAD, most significant involving the distal RCA.  Patient had DES x 2 to RCA.  Cath in 3/17 with patent RCA stents and diffuse distal and branch vessel disease concerning for poorly controlled diabetes.  Cath in 5/18 again showed diffuse CAD without good interventional target.  Cath in 11/20 showed severe  distal and branch vessel disease but no interventional targets and not good anatomy for CABG.  No further chest pain. - No ASA given Eliquis use.  - Continue atorvastatin 80 mg daily. - Continue Coreg and Imdur daily for angina control.  - Avoid ranolazine with sotalol use.  4. Hyperlipidemia:  On atorvastatin, good lipids 1/23.   5. Hypertension: BP controlled today.  6. OSA: Having trouble with CPAP, will ask sleep medicine to contact him. 7. CKD stable 3: BMET today.  8. DM: Per PCP.  9. Non-compliance: Not taking evening medications regularly. Paramedicine now helping. - Difficult situation. Discussed importance of medication regimen.  Followup 4 months with APP.   Loralie Champagne 03/15/2022

## 2022-03-22 NOTE — Telephone Encounter (Signed)
resubmit an order to [facility]. Waiting on a respond.

## 2022-03-23 ENCOUNTER — Ambulatory Visit: Payer: Medicare Other

## 2022-03-23 ENCOUNTER — Telehealth: Payer: Medicare Other

## 2022-03-23 NOTE — Chronic Care Management (AMB) (Signed)
Care Management    RN Visit Note  03/23/2022 Name: [name] [name] MRN: [id number] DOB: October 21, 1955  Subjective: [name] [name] is a 66 y.o. year old male who is a primary care patient of [name], MD. The care management team was consulted for assistance with disease management and care coordination needs.    Unsuccessful outreach attempt  for  Case closed unable to contact maintain and goals not met  in response to provider referral for case management and/or care coordination services.   Consent to Services:   [name] was given information about Care Management services today including:  Care Management services includes personalized support from designated clinical staff supervised by his physician, including individualized plan of care and coordination with other care providers 24/7 contact phone numbers for assistance for urgent and routine care needs. The patient may stop case management services at any time by phone call to the office staff.  Patient agreed to services and consent obtained.   Assessment: Review of patient past medical history, allergies, medications, health status, including review of consultants reports, laboratory and other test data, was performed as part of comprehensive evaluation and provision of chronic care management services.   SDOH (Social Determinants of Health) assessments and interventions performed:    Care Plan  Allergies  Allergen Reactions   Shrimp [Shellfish Allergy] Nausea And Vomiting   Ace Inhibitors Cough   Other Hives and Other (See Comments)    Patient reports developing hives after receiving "some antibiotic given in 1980''s at [facility]". He does not know which antibiotic.    Outpatient Encounter Medications as of 03/23/2022  Medication Sig   ACCU-CHEK SOFTCLIX LANCETS lancets Use as instructed for three times daily testing of blood glucose   acetaminophen (TYLENOL) 500 MG tablet Take 1,000  mg by mouth every 6 (six) hours as needed (pain).   albuterol (VENTOLIN HFA) 108 (90 Base) MCG/ACT inhaler Inhale 1-2 puffs into the lungs every 6 (six) hours as needed for wheezing or shortness of breath.   apixaban (ELIQUIS) 5 MG TABS tablet Take 1 tablet (5 mg total) by mouth 2 (two) times daily.   atorvastatin (LIPITOR) 80 MG tablet Take 1 tablet by mouth once daily   blood glucose meter kit and supplies KIT Dispense based on patient and insurance preference. Use up to four times daily as directed. (FOR ICD-9 250.00, 250.01).   Blood Glucose Monitoring Suppl (ACCU-CHEK AVIVA PLUS) w/Device KIT Use as directed for 3 times daily testing of blood glucose   carvedilol (COREG) 12.5 MG tablet Take 1 tablet (12.5 mg total) by mouth 2 (two) times daily with a meal.   colchicine 0.6 MG tablet On day 1 take 2 tablets at first sign of gout flare.  Take 1 tablet 1 hour later.  Then take 1 tablet daily starting the next day until resolution of gout flare.   Continuous Blood Gluc Receiver (FREESTYLE LIBRE 2 READER) DEVI Check your blood sugar three times per day before meals and once before bedtime   Continuous Blood Gluc Sensor (FREESTYLE LIBRE 2 SENSOR) MISC Apply one sensor to upper arm every 2 weeks   empagliflozin (JARDIANCE) 10 MG TABS tablet Take 1 tablet (10 mg total) by mouth daily before breakfast.   eplerenone (INSPRA) 25 MG tablet Take 1 tablet (25 mg total) by mouth daily.   glucose blood (ACCU-CHEK AVIVA PLUS) test strip Use as instructed for 3 times daily testing of blood glucose   hydrALAZINE (APRESOLINE)  50 MG tablet TAKE 1 & 1/2 (ONE & ONE-HALF) TABLETS BY MOUTH TWICE DAILY .   insulin glargine (LANTUS SOLOSTAR) 100 UNIT/ML Solostar Pen Inject 40 Units into the skin every morning. And pen needles 1/day   Insulin Pen Needle (PEN NEEDLES) 32G X 4 MM MISC 1 each by Does not apply route daily.   isosorbide mononitrate (IMDUR) 120 MG 24 hr tablet Take 1 tablet (120 mg total) by mouth daily.    Lancet Devices (ACCU-CHEK SOFTCLIX) lancets Use as instructed for 3 times daily testing of blood glucose   metFORMIN (GLUCOPHAGE) 1000 MG tablet Take 1,000 mg by mouth daily with breakfast.   nitroGLYCERIN (NITROSTAT) 0.4 MG SL tablet Place 1 tablet (0.4 mg total) under the tongue every 5 (five) minutes as needed for chest pain (x 3 tabs daily).   potassium chloride SA (KLOR-CON M) 20 MEQ tablet Take 1 tablet (20 mEq total) by mouth daily.   sotalol (BETAPACE) 120 MG tablet Take 1 tablet (120 mg total) by mouth 2 (two) times daily.   torsemide (DEMADEX) 20 MG tablet Take 80 mg by mouth 2 (two) times daily.   TRUEPLUS LANCETS 28G MISC 1 each by Does not apply route 3 (three) times daily.   No facility-administered encounter medications on file as of 03/23/2022.    Patient Active Problem List   Diagnosis Date Noted   Mobitz (type) I (Wenckebach's) atrioventricular block 01/23/2022   Chest pain of uncertain etiology 68/34/1962   Syncope 02/09/2020   Generalized weakness 06/27/2019   Acute on chronic congestive heart failure with left ventricular diastolic dysfunction ([place]) 06/26/2019   Anemia due to chronic kidney disease 06/26/2019   CKD (chronic kidney disease), stage III ([place]) 06/26/2019   Type 2 diabetes mellitus without complication ([place]) 22/97/9892   Iron deficiency anemia due to chronic blood loss 06/26/2019   History of atrial fibrillation    History of type 2 diabetes mellitus    Anemia    Chest pain 04/24/2019   Overweight (BMI 25.0-29.9) 02/28/2017   Unstable angina ([place]) 08/03/2016   S/P laparoscopic cholecystectomy 02/07/2016   Coronary artery disease involving native coronary artery with unstable angina pectoris ([place]) 12/29/2015   Diabetes ([place]) 11/12/2015   Chronic anticoagulation 08/22/2015   Atrial flutter with controlled response ([place]) 08/22/2015   RUQ pain    Cholelithiases- drain in place 06/04/2015   Essential hypertension    Chronic diastolic CHF (congestive  heart failure) ([place]) 04/24/2015   Excessive daytime sleepiness 11/25/2014   Obstructive sleep apnea 11/25/2014   NICM (nonischemic cardiomyopathy) ([place]) 03/11/2014   Anticoagulation goal of INR 2 to 3 10/19/2013   Eczema 10/19/2013   Low libido 10/16/2013   Persistent atrial fibrillation ([place]) 10/06/2013   Rectal bleeding 12/18/2011   HYPERCHOLESTEROLEMIA 07/11/2010   DENTAL CARIES 06/30/2010   GOUT 05/05/2009   HEPATITIS B, CHRONIC 12/30/2006   OSTEOCHONDROMA 12/30/2006   Mitral valve disorder 12/30/2006   LIVER FUNCTION TESTS, ABNORMAL, HX OF 12/30/2006   COLONIC POLYPS, HX OF 12/30/2006   COLONOSCOPY, HX OF 05/12/2001    Conditions to be addressed/monitored: CHF and DMII  Care Plan : RN Care Manager Plan of Care  Updates made by Dimitri Ped, RN since 03/23/2022 12:00 AM  Completed 03/23/2022   Problem: Chronic Disease Management and Care Coordination Needs (CHF,CAD,Atrial Fib, DMHTN and HLD) Resolved 03/23/2022  Priority: High     Long-Range Goal: Establish Plan of Care for Chronic Disease Management Needs (CHF,CAD,Atrial Fib, DM,HTN and HLD) Completed 03/23/2022  Start Date: 08/25/2021  Expected End Date: 08/22/2022  Priority: High  Note:   Case closed unable to contact maintain and goals not met Current Barriers:  Knowledge Deficits related to plan of care for management of Atrial Fibrillation, CHF, CAD, HTN, HLD, and DMII  Care Coordination needs related to Lacks knowledge of community resource: home repair Chronic Disease Management support and education needs related to Atrial Fibrillation, CHF, CAD, HTN, HLD, and DMII  States the paramedic is now coming to visit him.  States she filled his pill box and he is trying to take his medications better.  States he has only missed his medications once.  States he has been using his Cardiomem a few times a week and his weight is around 200.  States he does have a little shortness of breath with exertion.  Denies any swelling  or chest pains.    States he does not use a pill box but he does have a pill box.  States he continues eat fast food 2-3 times a week such as [employer] or [employer] when he is keeping his grandson.   States he has not checked  his B/P recently.  States he has been using the [employer] 4-6 times a day and his sugars are ranging 84-250.  States his is taking his Lantus better.  States he also takes Novolog insulin if his sugars are over 250.  States he got the Novolog insulin from a family member who is not using anymore.   States he has not using his CPAP every night. States he will call today to make an appointment with Dr. Volanda Napoleon. RNCM Clinical Goal(s):  Patient will verbalize understanding of plan for management of Atrial Fibrillation, CHF, CAD, HTN, HLD, and DMII as evidenced by voiced adherence to plan of care verbalize basic understanding of  Atrial Fibrillation, CHF, CAD, HTN, HLD, and DMII disease process and self health management plan as evidenced by voiced understanding and teach back take all medications exactly as prescribed and will call provider for medication related questions as evidenced by dispense report and pt verbalization attend all scheduled medical appointments: Heart Failure Clinic 03/14/22 as evidenced by medical records demonstrate Improved adherence to prescribed treatment plan for Atrial Fibrillation, CHF, CAD, HTN, HLD, and DMII as evidenced by readings within limits, voiced adherence to plan of care continue to work with RN Care Manager to address care management and care coordination needs related to  Atrial Fibrillation, CHF, CAD, HTN, HLD, and DMII as evidenced by adherence to CM Team Scheduled appointments work with [employer] care guide to address needs related to  [employer] knowledge of community resource: home repairs as evidenced by patient and/or community resource care guide support through collaboration with Consulting civil engineer, provider, and care team.    Interventions: 1:1 collaboration with primary care provider regarding development and update of comprehensive plan of care as evidenced by provider attestation and co-signature Inter-disciplinary care team collaboration (see longitudinal plan of care) Evaluation of current treatment plan related to  self management and patient's adherence to plan as established by provider Communicated with provider of pt's use of Novolog insulin that is not prescribed for him   AFIB Interventions: (Status:  Goal on track:  Yes. and Gaol Not Met.) Long Term Goal   Counseled on increased risk of stroke due to Afib and benefits of anticoagulation for stroke prevention Reviewed importance of adherence to anticoagulant exactly as prescribed Counseled on bleeding risk associated with Eliquis and importance of  self-monitoring for signs/symptoms of bleeding Counseled on avoidance of NSAIDs due to increased bleeding risk with anticoagulants Counseled on seeking medical attention after a head injury or if there is blood in the urine/stool Reinforced importance of taking his night time dose of Eliquis every day   CAD Interventions: (Status:  Gaol Not Met.) Long Term Goal Assessed understanding of CAD diagnosis Medications reviewed including medications utilized in CAD treatment plan Provided education on importance of blood pressure control in management of CAD Provided education on Importance of limiting foods high in cholesterol Reviewed Importance of taking all medications as prescribed Advised to report any changes in symptoms or exercise tolerance Reinforced signs and symptoms to call doctor and when to call 911   Heart Failure Interventions:  (Status:  Gaol Not Met.) Long Term Goal Basic overview and discussion of pathophysiology of Heart Failure reviewed Provided education on low sodium diet Reviewed Heart Failure Action Plan in depth and provided written copy Provided education about placing scale on  hard, flat surface Advised patient to weigh each morning after emptying bladder Discussed importance of daily weight and advised patient to weigh and record daily Reviewed role of diuretics in prevention of fluid overload and management of heart failure; Discussed the importance of keeping all appointments with provider Reviewed  to continue to work with the paramedic Reinforced to send in [place] daily. Reviewed importance of keeping appointment with HF clinic on 03/14/22.  Reinforced s/sx of HF to call provider.    Diabetes Interventions:  (Status:  Gaol Not Met.) Long Term Goal Assessed patient's understanding of A1c goal: <7% Provided education to patient about basic DM disease process Reviewed medications with patient and discussed importance of medication adherence Counseled on importance of regular laboratory monitoring as prescribed Provided patient with written educational materials related to hypo and hyperglycemia and importance of correct treatment Advised patient, providing education and rationale, to check cbg daily and record, calling provider for findings outside established parameters Referral made to community resources care guide team for assistance with pt has numbers to call to get window repaired. Reinforced to call numbers to see about getting repairs made  Reviewed that he need to see Dr. Volanda Napoleon to check his A1C and to see if she wants to order Novolog insulin for him and to call today to schedule. Reviewed to not use someone else's medications.    Reinforced to try to check his blood sugars and to avoid fast food.  Reinforced again to make follow up appointment with provider to check his diabetes Lab Results  Component Value Date   HGBA1C 8.7 (A) 05/05/2021   Hyperlipidemia Interventions:  (Status:  Gaol Not Met.) Long Term Goal Medication review performed; medication list updated in electronic medical record.  Provider established cholesterol goals reviewed Counseled  on importance of regular laboratory monitoring as prescribed Reviewed role and benefits of statin for ASCVD risk reduction Reviewed importance of limiting foods high in cholesterol  Hypertension Interventions:  (Status:  Gaol Not Met.) Long Term Goal Last practice recorded BP readings:  BP Readings from Last 3 Encounters:  02/07/22 134/76  01/24/22 130/60  01/23/22 111/60  Most recent eGFR/CrCl: No results found for: EGFR  No components found for: CRCL  Evaluation of current treatment plan related to hypertension self management and patient's adherence to plan as established by provider Provided education to patient re: stroke prevention, s/s of heart attack and stroke Advised patient, providing education and rationale, to monitor blood pressure daily and record, calling PCP  for findings outside established parameters Discussed complications of poorly controlled blood pressure such as heart disease, stroke, circulatory complications, vision complications, kidney impairment, sexual dysfunction Reinforced to check his B/P daily and keep log to take to MD appointments  Patient Goals/Self-Care Activities: Take all medications as prescribed Attend all scheduled provider appointments Call pharmacy for medication refills 3-7 days in advance of running out of medications Call provider office for new concerns or questions  call office if I gain more than 2 pounds in one day or 5 pounds in one week keep legs up while sitting watch for swelling in feet, ankles and legs every day weigh myself daily track symptoms and what helps feel better or worse Send Cardiomem reading daily schedule appointment with eye doctor check blood sugar at prescribed times: once daily and when you have symptoms of low or high blood sugar check feet daily for cuts, sores or redness take the blood sugar log to all doctor visits fill half of plate with vegetables limit fast food meals to no more than 1 per week manage  portion size switch to sugar-free drinks wash and dry feet carefully every day check pulse (heart) rate once a day make a plan to eat healthy take medicine as prescribed check blood pressure daily choose a place to take my blood pressure (home, clinic or office, retail store) keep a blood pressure log take blood pressure log to all doctor appointments take medications for blood pressure exactly as prescribed eat more whole grains, fruits and vegetables, lean meats and healthy fats limit salt intake to 2067m/day call for medicine refill 2 or 3 days before it runs out take all medications exactly as prescribed call doctor with any symptoms you believe are related to your medicine  Follow Up Plan:  The patient has been provided with contact information for the care management team and has been advised to call with any health related questions or concerns.  No further follow up required: Case closed unable to contact maintain and goals not met       Plan: The patient has been provided with contact information for the care management team and has been advised to call with any health related questions or concerns.  No further follow up required: Case closed unable to maintain contact and goals not met [facility] CDE Care Management Coordinator [facility] ([phone]

## 2022-03-23 NOTE — Patient Instructions (Signed)
Visit Information Case closed unable to maintain contact and goals not met Thank you for allowing me to share the care management and care coordination services that are available to you as part of your health plan and services through your primary care provider and medical home. Please reach out to me at [phone] if the care management/care coordination team may be of assistance to you in the future.   Peter Garter RN, Jackquline Denmark, CDE Care Management Coordinator [facility] [phone]

## 2022-03-27 ENCOUNTER — Telehealth: Payer: Self-pay | Admitting: *Deleted

## 2022-03-27 DIAGNOSIS — G4733 Obstructive sleep apnea (adult) (pediatric): Secondary | ICD-10-CM | POA: Diagnosis not present

## 2022-03-27 DIAGNOSIS — E119 Type 2 diabetes mellitus without complications: Secondary | ICD-10-CM | POA: Diagnosis not present

## 2022-03-27 NOTE — Telephone Encounter (Signed)
Spoke to [place] from [facility]. She reports the order is schedule to be picked up by [facility]. Not sure how long it takes until it is able to be deliver to patient address.   Contacted patient and inform him.

## 2022-03-27 NOTE — Telephone Encounter (Signed)
Left detailed message on voicemail and informed patients daughter to call back.

## 2022-03-27 NOTE — Telephone Encounter (Signed)
-----   Message from Sueanne Margarita, MD sent at 03/16/2022 12:35 PM EDT ----- [name] [name]   Please call patient and find out what is going on - he may need an in person appt with DME  Traci ----- Message ----- From: Larey Dresser, MD Sent: 03/15/2022   6:07 PM EDT To: Sueanne Margarita, MD  This patient had the tubing on his CPAP changed and has not been able to tolerate CPAP since then.  Could your office reach out to him to see what has changed? Thanks.

## 2022-03-28 NOTE — Telephone Encounter (Signed)
Reached out to the patient and did not get an answer so Per dpr I spoke with his daughter and she will call [employer] [facility] to help troubleshoot the problem and call our office back if a Rx is needed.

## 2022-04-02 ENCOUNTER — Telehealth (HOSPITAL_COMMUNITY): Payer: Self-pay

## 2022-04-02 NOTE — Telephone Encounter (Signed)
Spoke to [name] [name] who requested to move our home visit planned for today to Wednesday at 3:00. Visit confirmed and I will plan to see him Wednesday. Call complete.   Salena Saner, [place] 04/02/2022

## 2022-04-04 ENCOUNTER — Telehealth (HOSPITAL_COMMUNITY): Payer: Self-pay

## 2022-04-04 NOTE — Telephone Encounter (Signed)
[name] [name] called me to cancel our scheduled visit for today at 3:00 and wanted to reschedule for Monday at 3:00. I will follow up then. Call complete.  [name] [name], [name] 04/04/2022

## 2022-04-06 ENCOUNTER — Other Ambulatory Visit: Payer: Medicare Other

## 2022-04-09 ENCOUNTER — Other Ambulatory Visit (HOSPITAL_COMMUNITY): Payer: Self-pay

## 2022-04-09 NOTE — Progress Notes (Signed)
Paramedicine Encounter    Patient ID: [name] [name] [name], male    DOB: September 11, 1955, 66 y.o.   MRN: [id number]  Arrived for home visit for [name] [name] [name] who reports feeling okay today complaining of no symptoms. He denied chest pain, dizziness, swelling, weight gain or shortness of breath. No lower leg swelling noted.Lungs clear. He reports that he has been taking his medications over the last few weeks and reports he was able to attempt to fill his pill box but as I checked for accuracy it was not correct. I was able to confirm and verify all meds and fill pill box for two weeks. Refills will be noted and called into his pharmacy:  -[facility] -Metformin -Isosorbide -Torsemide -Lantus   We reviewed upcoming appointments and confirmed same.   I obtained vitals and they are as noted: WT- 202lbs BP- 160/82 HR- 71 O2- 96% RR- 17 CBG- 227   I plan to see [name] [name] [name] in two weeks and he knows to reach out in the mean time if needed. Visit complete.   Salena Saner, [place] 04/09/2022      Patient Care Team: [name], MD as PCP - General (Family Medicine) Larey Dresser, MD as PCP - Cardiology (Cardiology)  Patient Active Problem List   Diagnosis Date Noted   Mobitz (type) I [facility]) atrioventricular block 01/23/2022   Chest pain of uncertain etiology 22/29/7989   Syncope 02/09/2020   Generalized weakness 06/27/2019   Acute on chronic congestive heart failure with left ventricular diastolic dysfunction ([place]) 06/26/2019   Anemia due to chronic kidney disease 06/26/2019   CKD (chronic kidney disease), stage III ([place]) 06/26/2019   Type 2 diabetes mellitus without complication ([place]) 21/19/4174   Iron deficiency anemia due to chronic blood loss 06/26/2019   History of atrial fibrillation    History of type 2 diabetes mellitus    Anemia    Chest pain 04/24/2019   Overweight (BMI 25.0-29.9) 02/28/2017   Unstable angina ([place]) 08/03/2016   S/P  laparoscopic cholecystectomy 02/07/2016   Coronary artery disease involving native coronary artery with unstable angina pectoris ([place]) 12/29/2015   Diabetes ([place]) 11/12/2015   Chronic anticoagulation 08/22/2015   Atrial flutter with controlled response ([place]) 08/22/2015   RUQ pain    Cholelithiases- drain in place 06/04/2015   Essential hypertension    Chronic diastolic CHF (congestive heart failure) ([place]) 04/24/2015   Excessive daytime sleepiness 11/25/2014   Obstructive sleep apnea 11/25/2014   NICM (nonischemic cardiomyopathy) ([place]) 03/11/2014   Anticoagulation goal of INR 2 to 3 10/19/2013   Eczema 10/19/2013   Low libido 10/16/2013   Persistent atrial fibrillation ([place]) 10/06/2013   Rectal bleeding 12/18/2011   HYPERCHOLESTEROLEMIA 07/11/2010   DENTAL CARIES 06/30/2010   GOUT 05/05/2009   HEPATITIS B, CHRONIC 12/30/2006   OSTEOCHONDROMA 12/30/2006   Mitral valve disorder 12/30/2006   LIVER FUNCTION TESTS, ABNORMAL, HX OF 12/30/2006   COLONIC POLYPS, HX OF 12/30/2006   COLONOSCOPY, HX OF 05/12/2001    Current Outpatient Medications:    ACCU-CHEK SOFTCLIX LANCETS lancets, Use as instructed for three times daily testing of blood glucose, Disp: 100 each, Rfl: 12   acetaminophen (TYLENOL) 500 MG tablet, Take 1,000 mg by mouth every 6 (six) hours as needed (pain)., Disp: , Rfl:    albuterol (VENTOLIN HFA) 108 (90 Base) MCG/ACT inhaler, Inhale 1-2 puffs into the lungs every 6 (six) hours as needed for wheezing or shortness of breath., Disp: 8 g, Rfl: 0   apixaban (ELIQUIS) 5 MG  TABS tablet, Take 1 tablet (5 mg total) by mouth 2 (two) times daily., Disp: 60 tablet, Rfl: 6   atorvastatin (LIPITOR) 80 MG tablet, Take 1 tablet by mouth once daily, Disp: 90 tablet, Rfl: 3   blood glucose meter kit and supplies KIT, Dispense based on patient and insurance preference. Use up to four times daily as directed. (FOR ICD-9 250.00, 250.01)., Disp: 1 each, Rfl: 0   Blood Glucose Monitoring Suppl  (ACCU-CHEK AVIVA PLUS) w/Device KIT, Use as directed for 3 times daily testing of blood glucose, Disp: 1 kit, Rfl: 0   carvedilol (COREG) 12.5 MG tablet, Take 1 tablet (12.5 mg total) by mouth 2 (two) times daily with a meal., Disp: 60 tablet, Rfl: 2   colchicine 0.6 MG tablet, On day 1 take 2 tablets at first sign of gout flare.  Take 1 tablet 1 hour later.  Then take 1 tablet daily starting the next day until resolution of gout flare., Disp: 10 tablet, Rfl: 1   Continuous Blood Gluc Receiver (FREESTYLE LIBRE 2 READER) DEVI, Check your blood sugar three times per day before meals and once before bedtime, Disp: 2 each, Rfl: 11   Continuous Blood Gluc Sensor (FREESTYLE LIBRE 2 SENSOR) MISC, Apply one sensor to upper arm every 2 weeks, Disp: 2 each, Rfl: 2   empagliflozin (JARDIANCE) 10 MG TABS tablet, Take 1 tablet (10 mg total) by mouth daily before breakfast., Disp: 30 tablet, Rfl: 11   eplerenone (INSPRA) 25 MG tablet, Take 1 tablet (25 mg total) by mouth daily., Disp: 30 tablet, Rfl: 11   glucose blood (ACCU-CHEK AVIVA PLUS) test strip, Use as instructed for 3 times daily testing of blood glucose, Disp: 100 each, Rfl: 12   hydrALAZINE (APRESOLINE) 50 MG tablet, TAKE 1 & 1/2 (ONE & ONE-HALF) TABLETS BY MOUTH TWICE DAILY ., Disp: 250 tablet, Rfl: 3   insulin glargine (LANTUS SOLOSTAR) 100 UNIT/ML Solostar Pen, Inject 40 Units into the skin every morning. And pen needles 1/day, Disp: 45 mL, Rfl: 3   Insulin Pen Needle (PEN NEEDLES) 32G X 4 MM MISC, 1 each by Does not apply route daily., Disp: 100 each, Rfl: 3   isosorbide mononitrate (IMDUR) 120 MG 24 hr tablet, Take 1 tablet (120 mg total) by mouth daily., Disp: 30 tablet, Rfl: 6   Lancet Devices (ACCU-CHEK SOFTCLIX) lancets, Use as instructed for 3 times daily testing of blood glucose, Disp: 1 each, Rfl: 0   metFORMIN (GLUCOPHAGE) 1000 MG tablet, Take 1,000 mg by mouth daily with breakfast., Disp: 30 tablet, Rfl: 0   nitroGLYCERIN (NITROSTAT) 0.4 MG  SL tablet, Place 1 tablet (0.4 mg total) under the tongue every 5 (five) minutes as needed for chest pain (x 3 tabs daily)., Disp: 60 tablet, Rfl: 1   potassium chloride SA (KLOR-CON M) 20 MEQ tablet, Take 1 tablet (20 mEq total) by mouth daily., Disp: 30 tablet, Rfl: 6   sotalol (BETAPACE) 120 MG tablet, Take 1 tablet (120 mg total) by mouth 2 (two) times daily., Disp: 180 tablet, Rfl: 3   torsemide (DEMADEX) 20 MG tablet, Take 80 mg by mouth 2 (two) times daily., Disp: , Rfl:    TRUEPLUS LANCETS 28G MISC, 1 each by Does not apply route 3 (three) times daily., Disp: 100 each, Rfl: 12 Allergies  Allergen Reactions   Shrimp [Shellfish Allergy] Nausea And Vomiting   Ace Inhibitors Cough   Other Hives and Other (See Comments)    Patient reports developing hives after receiving "  some antibiotic given in 1980''s at [facility]". He does not know which antibiotic.     Social History   Socioeconomic History   Marital status: Widowed    Spouse name: Not on file   Number of children: 2   Years of education: 14   Highest education level: Some college, no degree  Occupational History    Employer: UNEMPLOYED  Tobacco Use   Smoking status: Former    Packs/day: 4.00    Years: 18.00    Total pack years: 72.00    Types: Cigarettes    Quit date: 08/14/1983    Years since quitting: 38.6   Smokeless tobacco: Never  Vaping Use   Vaping Use: Never used  Substance and Sexual Activity   Alcohol use: Yes    Alcohol/week: 16.0 standard drinks of alcohol    Types: 6 Cans of beer, 10 Shots of liquor per week    Comment: 2-3 beers watching sports; occasional glass of liquor; drinks heavily in football season   Drug use: Yes    Types: Marijuana    Comment: twice a week   Sexual activity: Never  Other Topics Concern   Not on file  Social History Narrative   Not on file   Social Determinants of Health   Financial Resource Strain: Low Risk  (07/19/2021)   Overall Financial Resource Strain  (CARDIA)    Difficulty of Paying Living Expenses: Not hard at all  Food Insecurity: Food Insecurity Present (12/11/2021)   Hunger Vital Sign    Worried About Running Out of Food in the Last Year: Sometimes true    Ran Out of Food in the Last Year: Never true  Transportation Needs: No Transportation Needs (12/11/2021)   PRAPARE - Hydrologist (Medical): No    Lack of Transportation (Non-Medical): No  Physical Activity: Insufficiently Active (07/19/2021)   Exercise Vital Sign    Days of Exercise per Week: 1 day    Minutes of Exercise per Session: 10 min  Stress: No Stress Concern Present (07/19/2021)   [place]    Feeling of Stress : Not at all  Social Connections: Socially Isolated (07/19/2021)   Social Connection and Isolation Panel [NHANES]    Frequency of Communication with Friends and Family: More than three times a week    Frequency of Social Gatherings with Friends and Family: More than three times a week    Attends Religious Services: Never    Marine scientist or Organizations: No    Attends Archivist Meetings: Never    Marital Status: Widowed  Intimate Partner Violence: Not At Risk (07/19/2021)   Humiliation, Afraid, Rape, and Kick questionnaire    Fear of Current or Ex-Partner: No    Emotionally Abused: No    Physically Abused: No    Sexually Abused: No    Physical Exam      Future Appointments  Date Time Provider [place]  05/14/2022 10:30 AM [facility] LAB [facility] None  05/16/2022 11:00 AM Elayne Snare, MD [facility] None  06/01/2022  9:00 AM MC-MR 1 [facility]MRI [facility]  07/18/2022 11:00 AM [facility] PA/NP [facility] None  07/23/2022  1:45 PM [facility]-NURSE HEALTH ADVISOR [facility] P[facility]     ACTION: Home visit completed

## 2022-04-10 ENCOUNTER — Ambulatory Visit: Payer: Medicare Other | Admitting: Endocrinology

## 2022-04-13 NOTE — Telephone Encounter (Signed)
Update from:  [facility] 8/30 ? 2:41PM  "Per the tracking number provided below, the patient's order was delivered on Thursday 03/29/2022 to the patient's front door"  Attempt to reach pt to follow up. No answer. Voicemail is full.

## 2022-04-18 ENCOUNTER — Other Ambulatory Visit (HOSPITAL_COMMUNITY): Payer: Self-pay | Admitting: Family Medicine

## 2022-04-18 ENCOUNTER — Telehealth (HOSPITAL_COMMUNITY): Payer: Self-pay

## 2022-04-18 DIAGNOSIS — I5032 Chronic diastolic (congestive) heart failure: Secondary | ICD-10-CM

## 2022-04-18 NOTE — Telephone Encounter (Signed)
Called in the following refills: -[facility] -Metformin -Isosorbide(needs refills) -Torsemide -Lantus (needs refills)   I made Halen aware to pick same up before our visit next week.   Salena Saner, [place] 04/18/2022

## 2022-04-20 ENCOUNTER — Other Ambulatory Visit: Payer: Self-pay

## 2022-04-20 DIAGNOSIS — E119 Type 2 diabetes mellitus without complications: Secondary | ICD-10-CM

## 2022-04-20 MED ORDER — LANTUS SOLOSTAR 100 UNIT/ML ~~LOC~~ SOPN: 40.0000 [IU] | PEN_INJECTOR | SUBCUTANEOUS | 3 refills | Status: DC

## 2022-04-23 ENCOUNTER — Other Ambulatory Visit (HOSPITAL_COMMUNITY): Payer: Self-pay

## 2022-04-23 NOTE — Progress Notes (Signed)
Paramedicine Encounter    Patient ID: [name] [name], male    DOB: 03/30/1956, 66 y.o.   MRN: [id number]  Arrived for home visit for [place] who reports feeling okay today just overall tired and short of breath. He has been non compliant with medications over the last two weeks only taking about 8 days of his medications total in the last 14 days. He reports he forgets and sometimes says he just doesn't want to take them. He says he does not like taking his potassium and doesn't want to continue to take it. I explained to him the importance and benefit of taking all medications that he is prescribed and how they effect his heart and overall health. He verbalized understanding and reports he will try to improve over the next two weeks. I had set up alarm reminders on his phone for help to remind him to take meds. We turned these on- as they had been turned off.   I reviewed all notes, assessment and vitals.  WT- 206lbs BP- 180/90 HR- 64 O2- 96% RR- 16 CBG- 221   He had not yet taken any meds today including insulin. He reports he has Lantus on hand and is still using Novolog sometimes to help bring his sugar down- of note he is not prescribed Novolog, we discussed this at length and he reports he will discuss it with Endocrinology in October. He verbalized understanding the risks of taking a medication not prescribed to him but says "It helps".   No lower leg swelling noted, weight up 4 lbs in two weeks. Lungs clear on assessment. He complains of general body aches daily and shortness of breath on exertion. He reports he does not exercise regularly and I prompted him on joining the [facility] or Cardiac Rehab and he felt unsure of this- I will revisit next time, but explained deconditioning and the importance of exercise.   Home visit complete. I will plan to see [name] [name] in two weeks. He agreed and knows to reach out in the mean time.   Refills: [facility] -Colchicine -Atorvastatin     Patient Care Team: Banks, Shannon R, MD as PCP - General (Family Medicine) McLean, Dalton S, MD as PCP - Cardiology (Cardiology)  Patient Active Problem List   Diagnosis Date Noted   Mobitz (type) I (Wenckebach's) atrioventricular block 01/23/2022   Chest pain of uncertain etiology 01/22/2022   Syncope 02/09/2020   Generalized weakness 06/27/2019   Acute on chronic congestive heart failure with left ventricular diastolic dysfunction (HCC) 06/26/2019   Anemia due to chronic kidney disease 06/26/2019   CKD (chronic kidney disease), stage III (HCC) 06/26/2019   Type 2 diabetes mellitus without complication (HCC) 06/26/2019   Iron deficiency anemia due to chronic blood loss 06/26/2019   History of atrial fibrillation    History of type 2 diabetes mellitus    Anemia    Chest pain 04/24/2019   Overweight (BMI 25.0-29.9) 02/28/2017   Unstable angina (HCC) 08/03/2016   S/P laparoscopic cholecystectomy 02/07/2016   Coronary artery disease involving native coronary artery with unstable angina pectoris (HCC) 12/29/2015   Diabetes (HCC) 11/12/2015   Chronic anticoagulation 08/22/2015   Atrial flutter with controlled response (HCC) 08/22/2015   RUQ pain    Cholelithiases- drain in place 06/04/2015   Essential hypertension    Chronic diastolic CHF (congestive heart failure) (HCC) 04/24/2015   Excessive daytime sleepiness 11/25/2014   Obstructive sleep apnea 11/25/2014   NICM (nonischemic   cardiomyopathy) (HCC) 03/11/2014   Anticoagulation goal of INR 2 to 3 10/19/2013   Eczema 10/19/2013   Low libido 10/16/2013   Persistent atrial fibrillation (HCC) 10/06/2013   Rectal bleeding 12/18/2011   HYPERCHOLESTEROLEMIA 07/11/2010   DENTAL CARIES 06/30/2010   GOUT 05/05/2009   HEPATITIS B, CHRONIC 12/30/2006   OSTEOCHONDROMA 12/30/2006   Mitral valve disorder 12/30/2006   LIVER FUNCTION TESTS, ABNORMAL, HX OF 12/30/2006   COLONIC POLYPS, HX OF  12/30/2006   COLONOSCOPY, HX OF 05/12/2001    Current Outpatient Medications:    ACCU-CHEK SOFTCLIX LANCETS lancets, Use as instructed for three times daily testing of blood glucose, Disp: 100 each, Rfl: 12   acetaminophen (TYLENOL) 500 MG tablet, Take 1,000 mg by mouth every 6 (six) hours as needed (pain)., Disp: , Rfl:    albuterol (VENTOLIN HFA) 108 (90 Base) MCG/ACT inhaler, Inhale 1-2 puffs into the lungs every 6 (six) hours as needed for wheezing or shortness of breath., Disp: 8 g, Rfl: 0   apixaban (ELIQUIS) 5 MG TABS tablet, Take 1 tablet (5 mg total) by mouth 2 (two) times daily., Disp: 60 tablet, Rfl: 6   atorvastatin (LIPITOR) 80 MG tablet, Take 1 tablet by mouth once daily, Disp: 90 tablet, Rfl: 3   blood glucose meter kit and supplies KIT, Dispense based on patient and insurance preference. Use up to four times daily as directed. (FOR ICD-9 250.00, 250.01)., Disp: 1 each, Rfl: 0   Blood Glucose Monitoring Suppl (ACCU-CHEK AVIVA PLUS) w/Device KIT, Use as directed for 3 times daily testing of blood glucose, Disp: 1 kit, Rfl: 0   carvedilol (COREG) 12.5 MG tablet, Take 1 tablet (12.5 mg total) by mouth 2 (two) times daily with a meal., Disp: 60 tablet, Rfl: 2   colchicine 0.6 MG tablet, On day 1 take 2 tablets at first sign of gout flare.  Take 1 tablet 1 hour later.  Then take 1 tablet daily starting the next day until resolution of gout flare., Disp: 10 tablet, Rfl: 1   Continuous Blood Gluc Receiver (FREESTYLE LIBRE 2 READER) DEVI, Check your blood sugar three times per day before meals and once before bedtime, Disp: 2 each, Rfl: 11   Continuous Blood Gluc Sensor (FREESTYLE LIBRE 2 SENSOR) MISC, Apply one sensor to upper arm every 2 weeks, Disp: 2 each, Rfl: 2   eplerenone (INSPRA) 25 MG tablet, Take 1 tablet (25 mg total) by mouth daily., Disp: 30 tablet, Rfl: 11   glucose blood (ACCU-CHEK AVIVA PLUS) test strip, Use as instructed for 3 times daily testing of blood glucose, Disp: 100  each, Rfl: 12   hydrALAZINE (APRESOLINE) 50 MG tablet, TAKE 1 & 1/2 (ONE & ONE-HALF) TABLETS BY MOUTH TWICE DAILY ., Disp: 250 tablet, Rfl: 3   insulin glargine (LANTUS SOLOSTAR) 100 UNIT/ML Solostar Pen, Inject 40 Units into the skin every morning. And pen needles 1/day, Disp: 45 mL, Rfl: 3   Insulin Pen Needle (PEN NEEDLES) 32G X 4 MM MISC, 1 each by Does not apply route daily., Disp: 100 each, Rfl: 3   isosorbide mononitrate (IMDUR) 120 MG 24 hr tablet, Take 1 tablet by mouth once daily, Disp: 30 tablet, Rfl: 11   JARDIANCE 10 MG TABS tablet, TAKE 1 TABLET BY MOUTH ONCE DAILY BEFORE BREAKFAST, Disp: 30 tablet, Rfl: 11   Lancet Devices (ACCU-CHEK SOFTCLIX) lancets, Use as instructed for 3 times daily testing of blood glucose, Disp: 1 each, Rfl: 0   metFORMIN (GLUCOPHAGE) 1000 MG tablet, Take   1,000 mg by mouth daily with breakfast., Disp: 30 tablet, Rfl: 0   nitroGLYCERIN (NITROSTAT) 0.4 MG SL tablet, Place 1 tablet (0.4 mg total) under the tongue every 5 (five) minutes as needed for chest pain (x 3 tabs daily)., Disp: 60 tablet, Rfl: 1   potassium chloride SA (KLOR-CON M) 20 MEQ tablet, Take 1 tablet (20 mEq total) by mouth daily., Disp: 30 tablet, Rfl: 6   sotalol (BETAPACE) 120 MG tablet, Take 1 tablet (120 mg total) by mouth 2 (two) times daily., Disp: 180 tablet, Rfl: 3   torsemide (DEMADEX) 20 MG tablet, Take 80 mg by mouth 2 (two) times daily., Disp: , Rfl:    TRUEPLUS LANCETS 28G MISC, 1 each by Does not apply route 3 (three) times daily., Disp: 100 each, Rfl: 12 Allergies  Allergen Reactions   Shrimp [Shellfish Allergy] Nausea And Vomiting   Ace Inhibitors Cough   Other Hives and Other (See Comments)    Patient reports developing hives after receiving "some antibiotic given in 1980''s at [facility]". He does not know which antibiotic.     Social History   Socioeconomic History   Marital status: Widowed    Spouse name: Not on file   Number of children: 2   Years of  education: 14   Highest education level: Some college, no degree  Occupational History    Employer: UNEMPLOYED  Tobacco Use   Smoking status: Former    Packs/day: 4.00    Years: 18.00    Total pack years: 72.00    Types: Cigarettes    Quit date: 08/14/1983    Years since quitting: 38.7   Smokeless tobacco: Never  Vaping Use   Vaping Use: Never used  Substance and Sexual Activity   Alcohol use: Yes    Alcohol/week: 16.0 standard drinks of alcohol    Types: 6 Cans of beer, 10 Shots of liquor per week    Comment: 2-3 beers watching sports; occasional glass of liquor; drinks heavily in football season   Drug use: Yes    Types: Marijuana    Comment: twice a week   Sexual activity: Never  Other Topics Concern   Not on file  Social History Narrative   Not on file   Social Determinants of Health   Financial Resource Strain: Low Risk  (07/19/2021)   Overall Financial Resource Strain (CARDIA)    Difficulty of Paying Living Expenses: Not hard at all  Food Insecurity: Food Insecurity Present (12/11/2021)   Hunger Vital Sign    Worried About Running Out of Food in the Last Year: Sometimes true    Ran Out of Food in the Last Year: Never true  Transportation Needs: No Transportation Needs (12/11/2021)   PRAPARE - Transportation    Lack of Transportation (Medical): No    Lack of Transportation (Non-Medical): No  Physical Activity: Insufficiently Active (07/19/2021)   Exercise Vital Sign    Days of Exercise per Week: 1 day    Minutes of Exercise per Session: 10 min  Stress: No Stress Concern Present (07/19/2021)   [employer] - Occupational Stress Questionnaire    Feeling of Stress : Not at all  Social Connections: Socially Isolated (07/19/2021)   Social Connection and Isolation Panel [NHANES]    Frequency of Communication with Friends and Family: More than three times a week    Frequency of Social Gatherings with Friends and Family: More than three times a week     Attends Religious   Services: Never    Active Member of Clubs or Organizations: No    Attends Club or Organization Meetings: Never    Marital Status: Widowed  Intimate Partner Violence: Not At Risk (07/19/2021)   Humiliation, Afraid, Rape, and Kick questionnaire    Fear of Current or Ex-Partner: No    Emotionally Abused: No    Physically Abused: No    Sexually Abused: No    Physical Exam      Future Appointments  Date Time Provider Department Center  05/14/2022 10:30 AM [facility] LAB [facility] None  05/16/2022 11:00 AM Kumar, Ajay, MD [facility] None  06/01/2022  9:00 AM [facility]MR 1 [facility]MRI [facility]  07/18/2022 11:00 AM [facility] PA/NP [facility] None  07/23/2022  1:45 PM [facility]-NURSE HEALTH ADVISOR [facility]     ACTION: Home visit completed       

## 2022-04-27 DIAGNOSIS — E119 Type 2 diabetes mellitus without complications: Secondary | ICD-10-CM | POA: Diagnosis not present

## 2022-04-27 DIAGNOSIS — G4733 Obstructive sleep apnea (adult) (pediatric): Secondary | ICD-10-CM | POA: Diagnosis not present

## 2022-05-07 ENCOUNTER — Telehealth (HOSPITAL_COMMUNITY): Payer: Self-pay

## 2022-05-07 ENCOUNTER — Ambulatory Visit (INDEPENDENT_AMBULATORY_CARE_PROVIDER_SITE_OTHER): Payer: Medicare Other | Admitting: Family Medicine

## 2022-05-07 VITALS — BP 138/90 | HR 74 | Temp 98.5°F | Ht 70.0 in | Wt 207.8 lb

## 2022-05-07 DIAGNOSIS — Z125 Encounter for screening for malignant neoplasm of prostate: Secondary | ICD-10-CM

## 2022-05-07 DIAGNOSIS — N1831 Chronic kidney disease, stage 3a: Secondary | ICD-10-CM | POA: Diagnosis not present

## 2022-05-07 DIAGNOSIS — Z23 Encounter for immunization: Secondary | ICD-10-CM

## 2022-05-07 DIAGNOSIS — Z0000 Encounter for general adult medical examination without abnormal findings: Secondary | ICD-10-CM

## 2022-05-07 DIAGNOSIS — F1090 Alcohol use, unspecified, uncomplicated: Secondary | ICD-10-CM

## 2022-05-07 DIAGNOSIS — R140 Abdominal distension (gaseous): Secondary | ICD-10-CM | POA: Diagnosis not present

## 2022-05-07 DIAGNOSIS — E1165 Type 2 diabetes mellitus with hyperglycemia: Secondary | ICD-10-CM | POA: Diagnosis not present

## 2022-05-07 DIAGNOSIS — Z789 Other specified health status: Secondary | ICD-10-CM | POA: Diagnosis not present

## 2022-05-07 DIAGNOSIS — I10 Essential (primary) hypertension: Secondary | ICD-10-CM

## 2022-05-07 DIAGNOSIS — I5032 Chronic diastolic (congestive) heart failure: Secondary | ICD-10-CM | POA: Diagnosis not present

## 2022-05-07 LAB — COMPREHENSIVE METABOLIC PANEL
ALT: 15 U/L (ref 0–53)
AST: 14 U/L (ref 0–37)
Albumin: 3.9 g/dL (ref 3.5–5.2)
Alkaline Phosphatase: 72 U/L (ref 39–117)
BUN: 15 mg/dL (ref 6–23)
CO2: 26 mEq/L (ref 19–32)
Calcium: 9.4 mg/dL (ref 8.4–10.5)
Chloride: 104 mEq/L (ref 96–112)
Creatinine, Ser: 1.36 mg/dL (ref 0.40–1.50)
GFR: 53.63 mL/min — ABNORMAL LOW (ref >60.00)
Glucose, Bld: 251 mg/dL — ABNORMAL HIGH (ref 70–99)
Potassium: 4.7 mEq/L (ref 3.5–5.1)
Sodium: 140 mEq/L (ref 135–145)
Total Bilirubin: 0.7 mg/dL (ref 0.2–1.2)
Total Protein: 7.3 g/dL (ref 6.0–8.3)

## 2022-05-07 LAB — CBC WITH DIFFERENTIAL/PLATELET
Basophils Absolute: 0 10*3/uL (ref 0.0–0.1)
Basophils Relative: 0.8 % (ref 0.0–3.0)
Eosinophils Absolute: 0.3 10*3/uL (ref 0.0–0.7)
Eosinophils Relative: 5.4 % — ABNORMAL HIGH (ref 0.0–5.0)
HCT: 50.8 % (ref 39.0–52.0)
Hemoglobin: 16.6 g/dL (ref 13.0–17.0)
Lymphocytes Relative: 35.5 % (ref 12.0–46.0)
Lymphs Abs: 1.9 10*3/uL (ref 0.7–4.0)
MCHC: 32.6 g/dL (ref 30.0–36.0)
MCV: 86.3 fl (ref 78.0–100.0)
Monocytes Absolute: 0.8 10*3/uL (ref 0.1–1.0)
Monocytes Relative: 14.7 % — ABNORMAL HIGH (ref 3.0–12.0)
Neutro Abs: 2.3 10*3/uL (ref 1.4–7.7)
Neutrophils Relative %: 43.6 % (ref 43.0–77.0)
Platelets: 115 10*3/uL — ABNORMAL LOW (ref 150.0–400.0)
RBC: 5.88 Mil/uL — ABNORMAL HIGH (ref 4.22–5.81)
RDW: 16.4 % — ABNORMAL HIGH (ref 11.5–15.5)
WBC: 5.4 10*3/uL (ref 4.0–10.5)

## 2022-05-07 LAB — MICROALBUMIN / CREATININE URINE RATIO
Creatinine,U: 73.3 mg/dL
Microalb Creat Ratio: 27.5 mg/g (ref 0.0–30.0)
Microalb, Ur: 20.1 mg/dL — ABNORMAL HIGH (ref 0.0–1.9)

## 2022-05-07 LAB — PSA: PSA: 1.36 ng/mL (ref 0.10–4.00)

## 2022-05-07 LAB — LIPID PANEL
Cholesterol: 152 mg/dL (ref 0–200)
HDL: 51.8 mg/dL (ref >39.00)
LDL Cholesterol: 79 mg/dL (ref 0–99)
NonHDL: 99.77
Total CHOL/HDL Ratio: 3
Triglycerides: 103 mg/dL (ref 0.0–149.0)
VLDL: 20.6 mg/dL (ref 0.0–40.0)

## 2022-05-07 LAB — HEMOGLOBIN A1C: Hgb A1c MFr Bld: 10.5 % — ABNORMAL HIGH (ref 4.6–6.5)

## 2022-05-07 LAB — T4, FREE: Free T4: 0.89 ng/dL (ref 0.60–1.60)

## 2022-05-07 LAB — TSH: TSH: 1.22 u[IU]/mL (ref 0.35–5.50)

## 2022-05-07 MED ORDER — INSULIN LISPRO (1 UNIT DIAL) 100 UNIT/ML (KWIKPEN): 6.0000 [IU] | PEN_INJECTOR | Freq: Three times a day (TID) | SUBCUTANEOUS | 11 refills | Status: DC

## 2022-05-07 NOTE — Telephone Encounter (Signed)
Called Mr. Kirsch to remind him of his PCP appointment this morning and to attempt to schedule a home visit- however his phone went to voicemail being unable to leave a message. I will text him and follow up.   Salena Saner, [place] 05/07/2022

## 2022-05-07 NOTE — Telephone Encounter (Signed)
[name] [name] returned my call confirming he will be attending his appointment with Dr. Volanda Napoleon and will call me for follow up after.    Salena Saner, [place] 05/07/2022

## 2022-05-07 NOTE — Patient Instructions (Addendum)
Don't forget to call and reschedule your eye appointment.  You should get a phone call about scheduling an appointment with the podiatrist.  You should also look into rescheduling an appointment with a gastroenterologist (stomach doctors).  Work on getting better control of your blood sugar.  Also consider getting shingles vaccine and the new COVID vaccine at your local drugstore.

## 2022-05-07 NOTE — Progress Notes (Signed)
Subjective:     [name] [name] is a 66 y.o. male and is here for a comprehensive physical exam. Pt states bs is high, some times meter will not give him a number.  Pt often forgets to take lantus 40 units.  BS this am 242.  Pt had [employer] fried chicken and bread last night for dinner.  Drinking beer when watching football games.  If blood sugar is really elevated he will take 8 units of NovoLog that he gets from friends.  Patient has an appointment to see Dr. Dwyane Dee, endocrinology in the next 2 weeks.  Pt inquires about seeing a podiatrist to trim his toenails.  Patient missed his eye appointment as he was out of town on the day of the appointment.  Patient notes increased abdominal bloating x1 month, flatus, more frequent loose and formed stools.  Denies pruritis, heart burn, PRBPR, n/v.  May drink 1 bottle of water per day with medication.  Otherwise drinks coffee, sweet tea, diet sodas, orange juice, milk.  Social History   Socioeconomic History   Marital status: Widowed    Spouse name: Not on file   Number of children: 2   Years of education: 14   Highest education level: Some college, no degree  Occupational History    Employer: UNEMPLOYED  Tobacco Use   Smoking status: Former    Packs/day: 4.00    Years: 18.00    Total pack years: 72.00    Types: Cigarettes    Quit date: 08/14/1983    Years since quitting: 38.7   Smokeless tobacco: Never  Vaping Use   Vaping Use: Never used  Substance and Sexual Activity   Alcohol use: Yes    Alcohol/week: 16.0 standard drinks of alcohol    Types: 6 Cans of beer, 10 Shots of liquor per week    Comment: 2-3 beers watching sports; occasional glass of liquor; drinks heavily in football season   Drug use: Yes    Types: Marijuana    Comment: twice a week   Sexual activity: Never  Other Topics Concern   Not on file  Social History Narrative   Not on file   Social Determinants of Health   Financial Resource Strain: Low Risk  (07/19/2021)    Overall Financial Resource Strain (CARDIA)    Difficulty of Paying Living Expenses: Not hard at all  Food Insecurity: Food Insecurity Present (12/11/2021)   Hunger Vital Sign    Worried About Running Out of Food in the Last Year: Sometimes true    Ran Out of Food in the Last Year: Never true  Transportation Needs: No Transportation Needs (12/11/2021)   PRAPARE - Hydrologist (Medical): No    Lack of Transportation (Non-Medical): No  Physical Activity: Insufficiently Active (07/19/2021)   Exercise Vital Sign    Days of Exercise per Week: 1 day    Minutes of Exercise per Session: 10 min  Stress: No Stress Concern Present (07/19/2021)   [place]    Feeling of Stress : Not at all  Social Connections: Socially Isolated (07/19/2021)   Social Connection and Isolation Panel [NHANES]    Frequency of Communication with Friends and Family: More than three times a week    Frequency of Social Gatherings with Friends and Family: More than three times a week    Attends Religious Services: Never    Marine scientist or Organizations: No  Attends Archivist Meetings: Never    Marital Status: Widowed  Intimate Partner Violence: Not At Risk (07/19/2021)   Humiliation, Afraid, Rape, and Kick questionnaire    Fear of Current or Ex-Partner: No    Emotionally Abused: No    Physically Abused: No    Sexually Abused: No   Health Maintenance  Topic Date Due   Zoster Vaccines- Shingrix (1 of 2) Never done   COVID-19 Vaccine (4 - Pfizer risk series) 08/08/2020   Diabetic kidney evaluation - Urine ACR  07/20/2021   OPHTHALMOLOGY EXAM  10/21/2021   INFLUENZA VACCINE  03/13/2022   FOOT EXAM  05/05/2022   Pneumonia Vaccine 67+ Years old (2 - PCV) 07/19/2022 (Originally 03/06/2010)   HEMOGLOBIN A1C  07/24/2022   Diabetic kidney evaluation - GFR measurement  03/15/2023   TETANUS/TDAP  09/29/2028    COLONOSCOPY (Pts 45-64yr Insurance coverage will need to be confirmed)  04/27/2029   Hepatitis C Screening  Completed   HPV VACCINES  Aged Out    The following portions of the patient's history were reviewed and updated as appropriate: allergies, current medications, past family history, past medical history, past social history, past surgical history, and problem list.  Review of Systems Pertinent items noted in HPI and remainder of comprehensive ROS otherwise negative.   Objective:    BP (!) 138/90 (BP Location: Left Arm, Patient Position: Sitting, Cuff Size: Normal)   Pulse 74   Temp 98.5 F (36.9 C) (Oral)   Ht '5\' 10"'$  (1.778 m)   Wt 207 lb 12.8 oz (94.3 kg)   SpO2 97%   BMI 29.82 kg/m  General appearance: alert, cooperative, and no distress Head: Normocephalic, without obvious abnormality, atraumatic Eyes: conjunctivae/corneas clear. PERRL, EOM's intact. Fundi benign. Ears: normal TM's and external ear canals both ears Nose: Nares normal. Septum midline. Mucosa normal. No drainage or sinus tenderness. Throat: lips, mucosa, and tongue normal; teeth and gums normal Neck: no adenopathy, no carotid bruit, no JVD, supple, symmetrical, trachea midline, and thyroid not enlarged, symmetric, no tenderness/mass/nodules Lungs: clear to auscultation bilaterally Heart: regular rate and rhythm, S1, S2 normal, no murmur, click, rub or gallop Abdomen: soft, non-tender; bowel sounds normal; no masses,  no organomegaly Extremities: extremities normal, atraumatic, no cyanosis or edema Pulses: 2+ and symmetric Skin: Skin color, texture, turgor normal. No rashes or lesions Lymph nodes: Cervical, supraclavicular, and axillary nodes normal. Neurologic: Alert and oriented X 3, normal strength and tone. Normal symmetric reflexes. Normal coordination and gait    Assessment:    Healthy male exam.      Plan:    Anticipatory guidance given including wearing seatbelts, smoke detectors in the home,  increasing physical activity, increasing p.o. intake of water and vegetables. -labs -Discussed immunizations.  Influenza vaccine given this visit.  Patient to obtain shingles and updated COVID-19 vaccine at pharmacy. -Colonoscopy done 04/28/2019 -Given handout -Next CPE in 1 year See After Visit Summary for Counseling Recommendations   Uncontrolled type 2 diabetes mellitus with hyperglycemia ([place] -Hemoglobin A1c 15.0% on 01/22/2022 -Discussed the importance of glycemic control -Discussed the importance of medication compliance -Continue Lantus 40 units daily and Jardiance 10 mg daily. -Patient to start Humalog 6 units 3 times daily with meals -Continue checking blood sugar daily -ACE I intolerance. - Plan: Microalbumin/Creatinine Ratio, Urine, Hemoglobin A1c, Lipid panel, Ambulatory referral to Podiatry, insulin lispro (HUMALOG KWIKPEN) 100 UNIT/ML KwikPen  Essential hypertension -Elevated -Recheck -Continue current medications including hydralazine 50 mg 3 times daily  -  Plan: TSH, T4, Free, Lipid panel  Chronic diastolic CHF (congestive heart failure) (HCC) -euvolemic -continue current meds -f/u with Cards  Need for influenza vaccination  - Plan: Flu Vaccine QUAD High Dose(Fluad)  Stage 3a chronic kidney disease ([place])  - Plan: CMP, CBC with Differential/Platelet  Screening for prostate cancer  - Plan: PSA  Abdominal distention -pt to schedule f/u with GI -given handout  - Plan: CMP  Alcohol use -discussed cutting down.  Pt willing to consider. -discussed risk a/w increased EtOH use. -continue to monitor. -Plan: CMP  F/u in 3 months  Grier Mitts, MD

## 2022-05-14 ENCOUNTER — Other Ambulatory Visit: Payer: Medicare Other

## 2022-05-15 NOTE — Addendum Note (Signed)
Encounter addended by: Jorge Ny, LCSW on: 05/15/2022 12:02 PM  Actions taken: Flowsheet accepted

## 2022-05-16 ENCOUNTER — Telehealth (HOSPITAL_COMMUNITY): Payer: Self-pay

## 2022-05-16 ENCOUNTER — Ambulatory Visit: Payer: Medicare Other | Admitting: Podiatry

## 2022-05-16 ENCOUNTER — Ambulatory Visit: Payer: Medicare Other | Admitting: Endocrinology

## 2022-05-16 NOTE — Telephone Encounter (Signed)
Spoke to [place] who reports he is doing well, he reports he feels he does not need paramedicine any longer as he feels he is competent on his own. He is knowleable about his own medications and has filled his own pill box several times. He has access to obtaining his own refills and is capable. He has access to managing his appointments and is capable of same. He has transportation. He has housing and access to food and income as well as insurance and good family support. He has a scale and weighs often. He is aware of signs of fluid overload and how to navigate same. He denied any further needs at this time. He agreed to being discharged from program and understands to reach out if a need arises. Call complete.       Patient is now discharged from [facility].  Patient has/has not met the following goals:  Yes :Patient expresses basic understanding of medications and what they are for Yes :Patient able to verbalize heart failure specific dietary/fluid restrictions Yes :Patient is aware of who to call if they have medical concerns or if they need to schedule or change appts Yes :Patient has a scale for daily weights and weighs regularly Yes :Patient able to verbalize concerning symptoms when they should call the HF clinic (weight gain ranges, etc) Yes :Patient has a PCP and has seen within the past year or has upcoming appt Yes :Patient has reliable access to getting their medications Yes :Patient has shown they are able to reorder medications reliably No :Patient has had admission in past 30 days- if yes how many? No :Patient has had admission in past 90 days- if yes how many?  Discharge Comments: Donne Anon, [place] 05/16/2022

## 2022-05-21 ENCOUNTER — Encounter: Payer: Self-pay | Admitting: Endocrinology

## 2022-05-21 ENCOUNTER — Telehealth: Payer: Self-pay | Admitting: *Deleted

## 2022-05-21 ENCOUNTER — Ambulatory Visit (INDEPENDENT_AMBULATORY_CARE_PROVIDER_SITE_OTHER): Payer: Medicare Other | Admitting: Endocrinology

## 2022-05-21 VITALS — BP 136/82 | HR 72 | Ht 71.0 in | Wt 202.0 lb

## 2022-05-21 DIAGNOSIS — Z794 Long term (current) use of insulin: Secondary | ICD-10-CM | POA: Diagnosis not present

## 2022-05-21 DIAGNOSIS — E1142 Type 2 diabetes mellitus with diabetic polyneuropathy: Secondary | ICD-10-CM

## 2022-05-21 DIAGNOSIS — E1165 Type 2 diabetes mellitus with hyperglycemia: Secondary | ICD-10-CM | POA: Diagnosis not present

## 2022-05-21 DIAGNOSIS — E7800 Pure hypercholesterolemia, unspecified: Secondary | ICD-10-CM

## 2022-05-21 NOTE — Progress Notes (Unsigned)
Patient ID: Najeh Credit, male   DOB: [birth year], 66 y.o.   MRN: [id number]           Reason for Appointment: Type II Diabetes follow-up   History of Present Illness   Diagnosis date: 2010  Previous history:  Non-insulin hypoglycemic drugs previously used: Metformin, Jardiance Insulin was started in 2013  A1c range in the last few years is: 7-15, generally higher since 2021  Recent history:     Non-insulin hypoglycemic drugs: Jardiance 10 mg daily, metformin 1 g daily     Insulin regimen: Lantus 40 units daily     Side effects from medications: None  Current self management, blood sugar patterns and problems identified:  A1c is 10.5, has not been seen in endocrinology for about a year His A1c had gone up to 15 as of June and was seen by PCP Recently was advised by his PCP to take Humalog and a prescription was sent but he says he only takes 8 units when the blood sugars are significantly high Also he is not regular with taking his Lantus and may sometimes forget in the morning and take it later in the day Blood sugars are much worse in June when A1c was higher No hypoglycemia He says that metformin makes him feel a little little nauseous but he does try to take it when he eats breakfast otherwise he will skip it Although he has been on Jardiance since last year the dose has not been adjusted He says that his back are mostly around 200 when he wakes up but does not know exact readings and generally blood sugars are progressively higher into the evening and occasionally over the upper limit on the sensor  Exercise: None  Diet management: may skip breakfast at times, not following any meal plan     Hypoglycemia:  none    Glucometer: Freestyle libre sensor version 2, did not bring his reader for download today, readings as above            Dietician visit: Most recent: None      Weight control:  Wt Readings from Last 3 Encounters:  05/21/22 202 lb (91.6 kg)   05/07/22 207 lb 12.8 oz (94.3 kg)  04/23/22 206 lb (93.4 kg)            Diabetes labs:  Lab Results  Component Value Date   HGBA1C 10.5 (H) 05/07/2022   HGBA1C 15.0 (H) 01/22/2022   HGBA1C 8.7 (A) 05/05/2021   Lab Results  Component Value Date   MICROALBUR 20.1 (H) 05/07/2022   LDLCALC 79 05/07/2022   CREATININE 1.36 05/07/2022    No results found for: "FRUCTOSAMINE"   Allergies as of 05/21/2022       Reactions   Shrimp [shellfish Allergy] Nausea And Vomiting   Ace Inhibitors Cough   Other Hives, Other (See Comments)   Patient reports developing hives after receiving "some antibiotic given in 1980''s at [facility]". He does not know which antibiotic.        Medication List        Accurate as of May 21, 2022  2:15 PM. If you have any questions, ask your nurse or doctor.          Accu-Chek Aviva Plus w/Device Kit Use as directed for 3 times daily testing of blood glucose   accu-chek softclix lancets Use as instructed for 3 times daily testing of blood glucose   acetaminophen 500 MG tablet Commonly known as:  TYLENOL Take 1,000 mg by mouth every 6 (six) hours as needed (pain).   albuterol 108 (90 Base) MCG/ACT inhaler Commonly known as: VENTOLIN HFA Inhale 1-2 puffs into the lungs every 6 (six) hours as needed for wheezing or shortness of breath.   apixaban 5 MG Tabs tablet Commonly known as: ELIQUIS Take 1 tablet (5 mg total) by mouth 2 (two) times daily.   atorvastatin 80 MG tablet Commonly known as: LIPITOR Take 1 tablet by mouth once daily   blood glucose meter kit and supplies Kit Dispense based on patient and insurance preference. Use up to four times daily as directed. (FOR ICD-9 250.00, 250.01).   carvedilol 12.5 MG tablet Commonly known as: COREG Take 1 tablet (12.5 mg total) by mouth 2 (two) times daily with a meal.   colchicine 0.6 MG tablet On day 1 take 2 tablets at first sign of gout flare.  Take 1 tablet 1 hour later.   Then take 1 tablet daily starting the next day until resolution of gout flare.   eplerenone 25 MG tablet Commonly known as: INSPRA Take 1 tablet (25 mg total) by mouth daily.   FreeStyle [place] 2 Reader [facility] Check your blood sugar three times per day before meals and once before bedtime   FreeStyle Libre 2 Sensor Misc Apply one sensor to upper arm every 2 weeks   glucose blood test strip Commonly known as: Accu-Chek Aviva Plus Use as instructed for 3 times daily testing of blood glucose   hydrALAZINE 50 MG tablet Commonly known as: APRESOLINE TAKE 1 & 1/2 (ONE & ONE-HALF) TABLETS BY MOUTH TWICE DAILY .   insulin lispro 100 UNIT/ML KwikPen Commonly known as: HumaLOG KwikPen Inject 6 Units into the skin with breakfast, with lunch, and with evening meal.   isosorbide mononitrate 120 MG 24 hr tablet Commonly known as: IMDUR Take 1 tablet by mouth once daily   Jardiance 10 MG Tabs tablet Generic drug: empagliflozin TAKE 1 TABLET BY MOUTH ONCE DAILY BEFORE BREAKFAST   Lantus SoloStar 100 UNIT/ML Solostar Pen Generic drug: insulin glargine Inject 40 Units into the skin every morning. And pen needles 1/day   metFORMIN 1000 MG tablet Commonly known as: GLUCOPHAGE Take 1,000 mg by mouth daily with breakfast.   nitroGLYCERIN 0.4 MG SL tablet Commonly known as: NITROSTAT Place 1 tablet (0.4 mg total) under the tongue every 5 (five) minutes as needed for chest pain (x 3 tabs daily).   Pen Needles 32G X 4 MM Misc 1 each by Does not apply route daily.   potassium chloride SA 20 MEQ tablet Commonly known as: KLOR-CON M Take 1 tablet (20 mEq total) by mouth daily.   sotalol 120 MG tablet Commonly known as: BETAPACE Take 1 tablet (120 mg total) by mouth 2 (two) times daily.   torsemide 20 MG tablet Commonly known as: DEMADEX Take 80 mg by mouth 2 (two) times daily.   TRUEplus Lancets 28G Misc 1 each by Does not apply route 3 (three) times daily.   Accu-Chek Softclix Lancets  lancets Use as instructed for three times daily testing of blood glucose        Allergies:  Allergies  Allergen Reactions   Shrimp [Shellfish Allergy] Nausea And Vomiting   Ace Inhibitors Cough   Other Hives and Other (See Comments)    Patient reports developing hives after receiving "some antibiotic given in 1980''s at [facility]". He does not know which antibiotic.    Past Medical History:  Diagnosis Date   Anxiety    CAD (coronary artery disease), native coronary artery    a. Nonobstructive CAD by cath 2013 - diffuse distal and branch vessel CAD, no severe disease in the major coronaries, LV mild global hypokinesis, EF 45%. b. ETT-Sestamibi 5/14: EF 31%, small fixed inferior defect with no ischemia.   Chronic CHF ([place])    a. Mixed ICM/NICM (?EtOH). EF 35% in 2008. Echo 5/13: EF 60-65%, mod LVH, EF 45% on V gram in 12/2011. EF 12/2012: EF 50-55%, mild LVH, inferobasal HK, mild MR. ETT-Ses 5/14 EF 41%. Cardiac MRI 5/14: EF 44%, mild global HK, subepicardial delayed enhancement in nonspecific RV insertion pattern.   COLONIC POLYPS, HX OF 12/30/2006   Gout    H/O atrial flutter 08/13/2005   a. Ablations in 2007, 2008.   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/1970   "left shoulder"   PAF (paroxysmal atrial fibrillation) ([place])    On coumadin   Sleep apnea    "suppose to send mask but they never did" (05/03/2015)   Type II diabetes mellitus ([place])     Past Surgical History:  Procedure Laterality Date   A FLUTTER ABLATION  2007, 2008   catheter ablation    CARDIAC CATHETERIZATION N/A 05/03/2015   Procedure: Right/Left Heart Cath and Coronary Angiography;   Surgeon: Larey Dresser, MD;  Location: [place] [facility];  Service: Cardiovascular;  Laterality: N/A;   CARDIAC CATHETERIZATION N/A 05/03/2015   Procedure: Coronary Stent Intervention;  Surgeon: Jettie Booze, MD;  Location: [place] [facility];  Service: Cardiovascular;  Laterality: N/A;   CARDIAC CATHETERIZATION N/A 11/04/2015   Procedure: Left Heart Cath and Coronary Angiography;  Surgeon: Larey Dresser, MD;  Location: [place] [facility];  Service: Cardiovascular;  Laterality: N/A;   CARDIOVERSION N/A 08/24/2015   Procedure: CARDIOVERSION;  Surgeon: Thayer Headings, MD;  [facility];  Service: Cardiovascular;  Laterality: N/A;   CHOLECYSTECTOMY N/A 02/07/2016   Procedure: LAPAROSCOPIC CHOLECYSTECTOMY WITH  INTRAOPERATIVE CHOLANGIOGRAM;  Surgeon: Greer Pickerel, MD;  Location: [facility];  Service: General;  Laterality: N/A;   COLONOSCOPY WITH PROPOFOL N/A 04/28/2019   Procedure: COLONOSCOPY WITH PROPOFOL;  Surgeon: Ronnette Juniper, MD;  Location: [place];  Service: Gastroenterology;  Laterality: N/A;   CORONARY ANGIOPLASTY  12/05/01   ESOPHAGOGASTRODUODENOSCOPY (EGD) WITH PROPOFOL N/A 04/27/2019   Procedure: ESOPHAGOGASTRODUODENOSCOPY (EGD) WITH PROPOFOL;  Surgeon: Ronnette Juniper, MD;  Location: [place];  Service: Gastroenterology;  Laterality: N/A;   GIVENS CAPSULE STUDY N/A 06/29/2019   Procedure: GIVENS CAPSULE STUDY;  Surgeon: Wilford Corner, MD;  Location: [place];  Service: Endoscopy;  Laterality: N/A;   LEFT HEART CATH AND CORONARY ANGIOGRAPHY N/A 12/28/2016   Procedure: Left Heart Cath and Coronary Angiography;  Surgeon: Larey Dresser, MD;  Location: [place] [facility];  Service: Cardiovascular;  Laterality: N/A;   LEFT HEART CATHETERIZATION WITH CORONARY ANGIOGRAM N/A 12/19/2011   Procedure: LEFT HEART CATHETERIZATION WITH CORONARY ANGIOGRAM;  Surgeon: Larey Dresser, MD;  Location[facility] CATH LAB;  Service: Cardiovascular;  Laterality: N/A;   OSTEOCHONDROMA EXCISION  Left 1972   "took bone tumor off my shoulder"  POLYPECTOMY  04/28/2019   Procedure: POLYPECTOMY;  Surgeon: Ronnette Juniper, MD;  Location: [facility];  Service: Gastroenterology;;   PRESSURE SENSOR/CARDIOMEMS N/A 02/01/2020   Procedure: PRESSURE SENSOR/CARDIOMEMS;  Surgeon: Larey Dresser, MD;  Location: [place] [facility];  Service: Cardiovascular;  Laterality: N/A;   RIGHT HEART CATH N/A 02/01/2020   Procedure: RIGHT HEART CATH;  Surgeon: Larey Dresser, MD;  Location: [place] [facility];  Service: Cardiovascular;  Laterality: N/A;   RIGHT/LEFT HEART CATH AND CORONARY ANGIOGRAPHY N/A 07/01/2019   Procedure: RIGHT/LEFT HEART CATH AND CORONARY ANGIOGRAPHY;  Surgeon: Larey Dresser, MD;  Location: [place] [facility];  Service: Cardiovascular;  Laterality: N/A;    Family History  Problem Relation Age of Onset   Diabetes Mother    Hypertension Mother    Heart attack Neg Hx    Stroke Neg Hx     Social History:  reports that he quit smoking about 38 years ago. His smoking use included cigarettes. He has a 72.00 pack-year smoking history. He has never used smokeless tobacco. He reports current alcohol use of about 16.0 standard drinks of alcohol per week. He reports current drug use. Drug: Marijuana.  Review of Systems:  Last diabetic eye exam date 3/22  Last urine microalbumin date: 9/23  Last foot exam date: 9/22  Symptoms of neuropathy: Some tingling in the toes  Hypertension:   ACE/ARB medication: None  BP Readings from Last 3 Encounters:  05/21/22 136/82  05/07/22 (!) 138/90  04/23/22 (!) 180/90    Lipid management: Prescribed Crestor by PCP, LDL generally controlled    Lab Results  Component Value Date   CHOL 152 05/07/2022   CHOL 115 08/28/2021   CHOL 156 03/21/2021   Lab Results  Component Value Date   HDL 51.80 05/07/2022   HDL 45 08/28/2021   HDL 54 03/21/2021   Lab Results  Component Value Date   LDLCALC 79 05/07/2022   LDLCALC 50 08/28/2021   LDLCALC  81 03/21/2021   Lab Results  Component Value Date   TRIG 103.0 05/07/2022   TRIG 98 08/28/2021   TRIG 107 03/21/2021   Lab Results  Component Value Date   CHOLHDL 3 05/07/2022   CHOLHDL 2.6 08/28/2021   CHOLHDL 2.9 03/21/2021   Lab Results  Component Value Date   LDLDIRECT 111.0 04/22/2015   LDLDIRECT 135.0 10/20/2012     Fibrosis 4 Score = 2.14       Fib-4 interpretation is not validated for people under 58 or over 31 years of age.      Examination:   BP 136/82   Pulse 72   Ht '5\' 11"'  (1.803 m)   Wt 202 lb (91.6 kg)   SpO2 96%   BMI 28.17 kg/m   Body mass index is 28.17 kg/m.    ASSESSMENT/ PLAN:    Diabetes type 2:   Current regimen: Lantus, Jardiance, metformin  See history of present illness for detailed discussion of current diabetes management, blood sugar patterns and problems identified  A1c is still significantly high at 10.5  He has significant insulin deficiency and especially has postprandial hyperglycemia not controlled with basal insulin and low-dose Jardiance and metformin Although he is using the freestyle libre sensor he did not bring his reader for download today and unable to identify blood sugar patterns  Discussed A1c target of 7%  Recommendations:   Again explained to him how basal insulin works, need to use this to control Premeal and overnight  readings.  Discussed fasting blood sugar targets of at least 150 He will currently need to increase his dose at least by 10 units  Also discussed titration based on fasting blood sugar every 3 days by 4 units and at target of 90-130 for fasting reading.  Given a flowsheet with instructions on how to keep a record and adjust the doses  Since he is not getting postprandial control with basal insulin and Jardiance only he will need to take Humalog consistently before each meal  discussed in detail the rationale for mealtime insulin to cover postprandial spikes, action of mealtime insulin, use of  the insulin pen, timing and action of the rapid acting insulin as well as starting doses and dosage titration to target the two-hour reading of under 180 If he starts seeing blood sugars below 100 after meals he will reduce the dose by 2 to 4 units Take 14 units before breakfast and dinner and 18 before lunch He will also add 4 to 6 units extra if blood sugars are over 200 or 300 over the mealtime dosage Increase Jardiance to 25 mg and for simplicity will combine this with metformin on the next visit Otherwise need to take metformin only with a full meal to avoid GI upset Follow-up with diabetes educator in 3 to 4 weeks for review of progress and further education To bring his reader for the [facility] every time  Hypercholesterolemia: LDL at target at 79 with statin drug Normal microalbumin  Patient Instructions  Take 50 Lantus and adjust to keep am sugar <150 at least  Check blood sugars on waking up    Also check blood sugars about 2 hours after meals and do this after different meals by rotation  Recommended blood sugar levels on waking up are 90-130 and about 2 hours after meal is 130-180  Please bring your blood sugar monitor to each visit, thank you    Elayne Snare 05/21/2022, 2:15 PM

## 2022-05-21 NOTE — Patient Outreach (Signed)
  Care Coordination   05/21/2022 Name: Rian Busche MRN: [id number] DOB: 1956/06/06   Care Coordination Outreach Attempts:  An unsuccessful telephone outreach was attempted today to offer the patient information about available care coordination services as a benefit of their health plan.   Follow Up Plan:  Additional outreach attempts will be made to offer the patient care coordination information and services.   Encounter Outcome:  No Answer  Care Coordination Interventions Activated:  No   Care Coordination Interventions:  No, not indicated    Raina Mina, RN Care Management Coordinator [place] Office [phone]

## 2022-05-21 NOTE — Patient Instructions (Addendum)
Take 50 Lantus and adjust to keep am sugar <150 at least  Check blood sugars on waking up    Also check blood sugars about 2 hours after meals and do this after different meals by rotation  Recommended blood sugar levels on waking up are 90-130 and about 2 hours after meal is 130-180  Please bring your blood sugar monitor to each visit, thank you

## 2022-05-22 MED ORDER — EMPAGLIFLOZIN 25 MG PO TABS: 25.0000 mg | ORAL_TABLET | Freq: Every day | ORAL | 3 refills | Status: DC

## 2022-05-24 ENCOUNTER — Telehealth: Payer: Self-pay | Admitting: *Deleted

## 2022-05-24 NOTE — Patient Outreach (Signed)
  Care Coordination   05/24/2022 Name: [name] [name] MRN: [id number] DOB: October 21, 1955   Care Coordination Outreach Attempts:  A second unsuccessful outreach was attempted today to offer the patient with information about available care coordination services as a benefit of their health plan.     Follow Up Plan:  Additional outreach attempts will be made to offer the patient care coordination information and services.   Encounter Outcome:  No Answer  Care Coordination Interventions Activated:  No   Care Coordination Interventions:  No, not indicated    Raina Mina, RN Care Management Coordinator [place] Office [phone]

## 2022-05-27 DIAGNOSIS — E119 Type 2 diabetes mellitus without complications: Secondary | ICD-10-CM | POA: Diagnosis not present

## 2022-05-27 DIAGNOSIS — G4733 Obstructive sleep apnea (adult) (pediatric): Secondary | ICD-10-CM | POA: Diagnosis not present

## 2022-05-30 ENCOUNTER — Encounter: Payer: Self-pay | Admitting: Family Medicine

## 2022-05-31 ENCOUNTER — Telehealth (HOSPITAL_COMMUNITY): Payer: Self-pay | Admitting: *Deleted

## 2022-05-31 NOTE — Telephone Encounter (Signed)
Reaching out to patient to offer assistance regarding upcoming cardiac imaging study; pt verbalizes understanding of appt date/time, parking situation and where to check in; name and call back number provided for further questions should they arise  Gordy Clement RN Navigator [facility] [phone] office [phone] cell  Patient reports having MRIs in the past without issue.

## 2022-06-01 ENCOUNTER — Other Ambulatory Visit: Payer: Self-pay

## 2022-06-01 ENCOUNTER — Encounter (HOSPITAL_COMMUNITY): Payer: Self-pay | Admitting: Emergency Medicine

## 2022-06-01 ENCOUNTER — Emergency Department (HOSPITAL_COMMUNITY)
Admission: EM | Admit: 2022-06-01 | Discharge: 2022-06-02 | Disposition: A | Discharge status: Home / Self Care | Payer: Medicare Other | Attending: Emergency Medicine | Admitting: Emergency Medicine

## 2022-06-01 ENCOUNTER — Other Ambulatory Visit (HOSPITAL_COMMUNITY): Payer: Self-pay | Admitting: Cardiology

## 2022-06-01 ENCOUNTER — Emergency Department (EMERGENCY_DEPARTMENT_HOSPITAL)
Admission: RE | Admit: 2022-06-01 | Discharge: 2022-06-01 | Disposition: A | Discharge status: Home / Self Care | Payer: Medicare Other | Source: Ambulatory Visit | Attending: Cardiology | Admitting: Cardiology

## 2022-06-01 DIAGNOSIS — Z7984 Long term (current) use of oral hypoglycemic drugs: Secondary | ICD-10-CM | POA: Insufficient documentation

## 2022-06-01 DIAGNOSIS — Y906 Blood alcohol level of 120-199 mg/100 ml: Secondary | ICD-10-CM | POA: Insufficient documentation

## 2022-06-01 DIAGNOSIS — R519 Headache, unspecified: Secondary | ICD-10-CM | POA: Insufficient documentation

## 2022-06-01 DIAGNOSIS — Z79899 Other long term (current) drug therapy: Secondary | ICD-10-CM | POA: Diagnosis not present

## 2022-06-01 DIAGNOSIS — R6889 Other general symptoms and signs: Secondary | ICD-10-CM | POA: Diagnosis not present

## 2022-06-01 DIAGNOSIS — S060X9A Concussion with loss of consciousness of unspecified duration, initial encounter: Secondary | ICD-10-CM | POA: Diagnosis not present

## 2022-06-01 DIAGNOSIS — I110 Hypertensive heart disease with heart failure: Secondary | ICD-10-CM | POA: Diagnosis not present

## 2022-06-01 DIAGNOSIS — M7989 Other specified soft tissue disorders: Secondary | ICD-10-CM | POA: Diagnosis not present

## 2022-06-01 DIAGNOSIS — R079 Chest pain, unspecified: Secondary | ICD-10-CM | POA: Diagnosis not present

## 2022-06-01 DIAGNOSIS — I672 Cerebral atherosclerosis: Secondary | ICD-10-CM | POA: Diagnosis not present

## 2022-06-01 DIAGNOSIS — R739 Hyperglycemia, unspecified: Secondary | ICD-10-CM | POA: Diagnosis not present

## 2022-06-01 DIAGNOSIS — Z7901 Long term (current) use of anticoagulants: Secondary | ICD-10-CM | POA: Diagnosis not present

## 2022-06-01 DIAGNOSIS — R112 Nausea with vomiting, unspecified: Secondary | ICD-10-CM | POA: Diagnosis not present

## 2022-06-01 DIAGNOSIS — Z794 Long term (current) use of insulin: Secondary | ICD-10-CM | POA: Diagnosis not present

## 2022-06-01 DIAGNOSIS — I509 Heart failure, unspecified: Secondary | ICD-10-CM | POA: Insufficient documentation

## 2022-06-01 DIAGNOSIS — S0990XA Unspecified injury of head, initial encounter: Secondary | ICD-10-CM | POA: Diagnosis not present

## 2022-06-01 DIAGNOSIS — W01198A Fall on same level from slipping, tripping and stumbling with subsequent striking against other object, initial encounter: Secondary | ICD-10-CM | POA: Diagnosis not present

## 2022-06-01 DIAGNOSIS — M79645 Pain in left finger(s): Secondary | ICD-10-CM | POA: Insufficient documentation

## 2022-06-01 DIAGNOSIS — I6529 Occlusion and stenosis of unspecified carotid artery: Secondary | ICD-10-CM | POA: Diagnosis not present

## 2022-06-01 DIAGNOSIS — E119 Type 2 diabetes mellitus without complications: Secondary | ICD-10-CM | POA: Diagnosis not present

## 2022-06-01 DIAGNOSIS — R0781 Pleurodynia: Secondary | ICD-10-CM | POA: Diagnosis not present

## 2022-06-01 DIAGNOSIS — I4891 Unspecified atrial fibrillation: Secondary | ICD-10-CM | POA: Diagnosis not present

## 2022-06-01 DIAGNOSIS — S199XXA Unspecified injury of neck, initial encounter: Secondary | ICD-10-CM | POA: Diagnosis not present

## 2022-06-01 DIAGNOSIS — I5032 Chronic diastolic (congestive) heart failure: Secondary | ICD-10-CM | POA: Insufficient documentation

## 2022-06-01 DIAGNOSIS — R0789 Other chest pain: Secondary | ICD-10-CM | POA: Diagnosis not present

## 2022-06-01 DIAGNOSIS — Z743 Need for continuous supervision: Secondary | ICD-10-CM | POA: Diagnosis not present

## 2022-06-01 DIAGNOSIS — M47812 Spondylosis without myelopathy or radiculopathy, cervical region: Secondary | ICD-10-CM | POA: Diagnosis not present

## 2022-06-01 DIAGNOSIS — M50222 Other cervical disc displacement at C5-C6 level: Secondary | ICD-10-CM | POA: Diagnosis not present

## 2022-06-01 DIAGNOSIS — M79642 Pain in left hand: Secondary | ICD-10-CM | POA: Diagnosis not present

## 2022-06-01 DIAGNOSIS — W19XXXA Unspecified fall, initial encounter: Secondary | ICD-10-CM

## 2022-06-01 MED ORDER — GADOBUTROL 1 MMOL/ML IV SOLN
10.0000 mL | Freq: Once | INTRAVENOUS | Status: AC | PRN
  Administered 2022-06-01: 10 mL via INTRAVENOUS

## 2022-06-01 NOTE — ED Triage Notes (Signed)
Pt c/o intermittent chest pain that started today. Hx CHF, given '324mg'$  asa pta. EMS VS: BP 168/96, Hr 72, SpO2 92% room air.

## 2022-06-01 NOTE — ED Provider Triage Note (Signed)
  Emergency Medicine Provider Triage Evaluation Note  MRN:  [id number]  Arrival date & time: 06/02/22    Medically screening exam initiated at 12:46 AM.   CC:   Chest Pain   HPI:  [name] [name] is a 66 y.o. year-old male presents to the ED with chief complaint of chest pains for the past few days.  Seems intoxicated.  Denies having significant pain now.  History provided by patient. ROS:  -As included in HPI PE:   Vitals:   06/01/22 2358  BP: 125/68  Pulse: 80  Resp: 18  Temp: 98.2 F (36.8 C)  SpO2: 93%    Non-toxic appearing No respiratory distress  MDM:  Based on signs and symptoms, ACS is highest on my differential. I've ordered labs and imaging in triage to expedite lab/diagnostic workup.  Patient was informed that the remainder of the evaluation will be completed by another provider, this initial triage assessment does not replace that evaluation, and the importance of remaining in the ED until their evaluation is complete.    Montine Circle, PA-C 06/02/22 [phone]

## 2022-06-02 ENCOUNTER — Emergency Department (HOSPITAL_COMMUNITY): Payer: Medicare Other

## 2022-06-02 DIAGNOSIS — M79642 Pain in left hand: Secondary | ICD-10-CM | POA: Diagnosis not present

## 2022-06-02 DIAGNOSIS — S0990XA Unspecified injury of head, initial encounter: Secondary | ICD-10-CM | POA: Diagnosis not present

## 2022-06-02 DIAGNOSIS — I672 Cerebral atherosclerosis: Secondary | ICD-10-CM | POA: Diagnosis not present

## 2022-06-02 DIAGNOSIS — M50222 Other cervical disc displacement at C5-C6 level: Secondary | ICD-10-CM | POA: Diagnosis not present

## 2022-06-02 DIAGNOSIS — R079 Chest pain, unspecified: Secondary | ICD-10-CM | POA: Diagnosis not present

## 2022-06-02 DIAGNOSIS — S60932A Unspecified superficial injury of left thumb, initial encounter: Secondary | ICD-10-CM | POA: Diagnosis not present

## 2022-06-02 DIAGNOSIS — M47812 Spondylosis without myelopathy or radiculopathy, cervical region: Secondary | ICD-10-CM | POA: Diagnosis not present

## 2022-06-02 DIAGNOSIS — I6529 Occlusion and stenosis of unspecified carotid artery: Secondary | ICD-10-CM | POA: Diagnosis not present

## 2022-06-02 DIAGNOSIS — S199XXA Unspecified injury of neck, initial encounter: Secondary | ICD-10-CM | POA: Diagnosis not present

## 2022-06-02 DIAGNOSIS — M7989 Other specified soft tissue disorders: Secondary | ICD-10-CM | POA: Diagnosis not present

## 2022-06-02 LAB — COMPREHENSIVE METABOLIC PANEL
ALT: 23 U/L (ref 0–44)
AST: 38 U/L (ref 15–41)
Albumin: 3.6 g/dL (ref 3.5–5.0)
Alkaline Phosphatase: 70 U/L (ref 38–126)
Anion gap: 15 (ref 5–15)
BUN: 20 mg/dL (ref 8–23)
CO2: 23 mmol/L (ref 22–32)
Calcium: 9.5 mg/dL (ref 8.9–10.3)
Chloride: 97 mmol/L — ABNORMAL LOW (ref 98–111)
Creatinine, Ser: 1.51 mg/dL — ABNORMAL HIGH (ref 0.61–1.24)
GFR, Estimated: 50 mL/min — ABNORMAL LOW (ref >60)
Glucose, Bld: 355 mg/dL — ABNORMAL HIGH (ref 70–99)
Potassium: 3.7 mmol/L (ref 3.5–5.1)
Sodium: 135 mmol/L (ref 135–145)
Total Bilirubin: 0.5 mg/dL (ref 0.3–1.2)
Total Protein: 7.7 g/dL (ref 6.5–8.1)

## 2022-06-02 LAB — CBC WITH DIFFERENTIAL/PLATELET
Abs Immature Granulocytes: 0.07 10*3/uL (ref 0.00–0.07)
Basophils Absolute: 0.1 10*3/uL (ref 0.0–0.1)
Basophils Relative: 1 %
Eosinophils Absolute: 0.3 10*3/uL (ref 0.0–0.5)
Eosinophils Relative: 5 %
HCT: 54.9 % — ABNORMAL HIGH (ref 39.0–52.0)
Hemoglobin: 18.1 g/dL — ABNORMAL HIGH (ref 13.0–17.0)
Immature Granulocytes: 1 %
Lymphocytes Relative: 50 %
Lymphs Abs: 3.3 10*3/uL (ref 0.7–4.0)
MCH: 27.9 pg (ref 26.0–34.0)
MCHC: 33 g/dL (ref 30.0–36.0)
MCV: 84.7 fL (ref 80.0–100.0)
Monocytes Absolute: 0.9 10*3/uL (ref 0.1–1.0)
Monocytes Relative: 13 %
Neutro Abs: 2 10*3/uL (ref 1.7–7.7)
Neutrophils Relative %: 30 %
Platelets: 134 10*3/uL — ABNORMAL LOW (ref 150–400)
RBC: 6.48 MIL/uL — ABNORMAL HIGH (ref 4.22–5.81)
RDW: 17.4 % — ABNORMAL HIGH (ref 11.5–15.5)
WBC: 6.7 10*3/uL (ref 4.0–10.5)
nRBC: 0 % (ref 0.0–0.2)

## 2022-06-02 LAB — CBG MONITORING, ED
Glucose-Capillary: 409 mg/dL — ABNORMAL HIGH (ref 70–99)
Glucose-Capillary: 256 mg/dL — ABNORMAL HIGH (ref 70–99)

## 2022-06-02 LAB — ACETAMINOPHEN LEVEL: Acetaminophen (Tylenol), Serum: <10 ug/mL — ABNORMAL LOW (ref 10–30)

## 2022-06-02 LAB — SALICYLATE LEVEL: Salicylate Lvl: <7 mg/dL — ABNORMAL LOW (ref 7.0–30.0)

## 2022-06-02 LAB — TROPONIN I (HIGH SENSITIVITY)
Troponin I (High Sensitivity): 23 ng/L — ABNORMAL HIGH (ref <18)
Troponin I (High Sensitivity): 22 ng/L — ABNORMAL HIGH (ref <18)
Troponin I (High Sensitivity): 22 ng/L — ABNORMAL HIGH (ref <18)

## 2022-06-02 LAB — RAPID URINE DRUG SCREEN, HOSP PERFORMED
Amphetamines: NOT DETECTED
Barbiturates: NOT DETECTED
Benzodiazepines: NOT DETECTED
Cocaine: NOT DETECTED
Opiates: NOT DETECTED
Tetrahydrocannabinol: POSITIVE — AB

## 2022-06-02 LAB — ETHANOL: Alcohol, Ethyl (B): 176 mg/dL — ABNORMAL HIGH (ref <10)

## 2022-06-02 MED ORDER — CYCLOBENZAPRINE HCL 10 MG PO TABS: 10.0000 mg | ORAL_TABLET | Freq: Two times a day (BID) | ORAL | 0 refills | Status: DC | PRN

## 2022-06-02 MED ORDER — ISOSORBIDE MONONITRATE ER 30 MG PO TB24
120.0000 mg | ORAL_TABLET | Freq: Every day | ORAL | Status: DC
  Administered 2022-06-02: 120 mg via ORAL
  Filled 2022-06-02: qty 4

## 2022-06-02 MED ORDER — CYCLOBENZAPRINE HCL 10 MG PO TABS
10.0000 mg | ORAL_TABLET | Freq: Once | ORAL | Status: AC
  Administered 2022-06-02: 10 mg via ORAL
  Filled 2022-06-02: qty 1

## 2022-06-02 MED ORDER — ACETAMINOPHEN 500 MG PO TABS
1000.0000 mg | ORAL_TABLET | Freq: Once | ORAL | Status: AC
  Administered 2022-06-02: 1000 mg via ORAL
  Filled 2022-06-02: qty 2

## 2022-06-02 MED ORDER — INSULIN ASPART 100 UNIT/ML IJ SOLN: 5.0000 [IU] | Freq: Once | INTRAMUSCULAR | Status: DC

## 2022-06-02 MED ORDER — CARVEDILOL 3.125 MG PO TABS
6.2500 mg | ORAL_TABLET | Freq: Once | ORAL | Status: AC
  Administered 2022-06-02: 6.25 mg via ORAL
  Filled 2022-06-02: qty 2

## 2022-06-02 NOTE — ED Notes (Signed)
Ortho at bedside.

## 2022-06-02 NOTE — ED Provider Notes (Signed)
[facility] EMERGENCY DEPARTMENT Provider Note   CSN: [id number] Arrival date & time: 06/01/22  2335     History {Add pertinent medical, surgical, social history, OB history to HPI:1} Chief Complaint  Patient presents with   Chest Pain    [name] [name] is a 66 y.o. male.   Chest Pain      Home Medications Prior to Admission medications   Medication Sig Start Date End Date Taking? Authorizing Provider  ACCU-CHEK SOFTCLIX LANCETS lancets Use as instructed for three times daily testing of blood glucose 09/06/16   Jegede, Olugbemiga E, MD  acetaminophen (TYLENOL) 500 MG tablet Take 1,000 mg by mouth every 6 (six) hours as needed (pain).    [provider]  albuterol (VENTOLIN HFA) 108 (90 Base) MCG/ACT inhaler Inhale 1-2 puffs into the lungs every 6 (six) hours as needed for wheezing or shortness of breath. 04/06/20   Tasia Catchings, Amy V, PA-C  apixaban (ELIQUIS) 5 MG TABS tablet Take 1 tablet (5 mg total) by mouth 2 (two) times daily. 05/24/21   Rafael Bihari, FNP  atorvastatin (LIPITOR) 80 MG tablet Take 1 tablet by mouth once daily 12/13/21   Rafael Bihari, FNP  blood glucose meter kit and supplies KIT Dispense based on patient and insurance preference. Use up to four times daily as directed. (FOR ICD-9 250.00, 250.01). 07/20/20   [name], MD  Blood Glucose Monitoring Suppl (ACCU-CHEK AVIVA PLUS) w/Device KIT Use as directed for 3 times daily testing of blood glucose 09/06/16   Jegede, Olugbemiga E, MD  carvedilol (COREG) 12.5 MG tablet Take 1 tablet (12.5 mg total) by mouth 2 (two) times daily with a meal. 01/23/22 04/23/22  Mariel Aloe, MD  colchicine 0.6 MG tablet On day 1 take 2 tablets at first sign of gout flare.  Take 1 tablet 1 hour later.  Then take 1 tablet daily starting the next day until resolution of gout flare. 05/24/21   Rafael Bihari, FNP  Continuous Blood Gluc Receiver (FREESTYLE LIBRE 2 READER) DEVI Check your blood  sugar three times per day before meals and once before bedtime 02/19/22   [name], MD  Continuous Blood Gluc Sensor (FREESTYLE LIBRE 2 SENSOR) MISC Apply one sensor to upper arm every 2 weeks 01/23/22   Mariel Aloe, MD  empagliflozin (JARDIANCE) 25 MG TABS tablet Take 1 tablet (25 mg total) by mouth daily before breakfast. 05/22/22   Elayne Snare, MD  eplerenone (INSPRA) 25 MG tablet Take 1 tablet (25 mg total) by mouth daily. 05/24/21   [place]ford, Maricela Bo, FNP  glucose blood (ACCU-CHEK AVIVA PLUS) test strip Use as instructed for 3 times daily testing of blood glucose 09/06/16   Jegede, Olugbemiga E, MD  hydrALAZINE (APRESOLINE) 50 MG tablet TAKE 1 & 1/2 (ONE & ONE-HALF) TABLETS BY MOUTH TWICE DAILY . 02/08/22   Larey Dresser, MD  insulin glargine (LANTUS SOLOSTAR) 100 UNIT/ML Solostar Pen Inject 40 Units into the skin every morning. And pen needles 1/day 04/20/22   Elayne Snare, MD  insulin lispro (HUMALOG KWIKPEN) 100 UNIT/ML KwikPen Inject 6 Units into the skin with breakfast, with lunch, and with evening meal. Patient not taking: Reported on 05/21/2022 05/07/22   [name], MD  Insulin Pen Needle (PEN NEEDLES) 32G X 4 MM MISC 1 each by Does not apply route daily. 05/07/21   Renato Shin, MD  isosorbide mononitrate (IMDUR) 120 MG 24 hr tablet Take 1 tablet by  mouth once daily 04/19/22   Rafael Bihari, FNP  Lancet Devices (ACCU-CHEK [place]) lancets Use as instructed for 3 times daily testing of blood glucose 09/06/16   Tresa Garter, MD  metFORMIN (GLUCOPHAGE) 1000 MG tablet Take 1,000 mg by mouth daily with breakfast. 01/15/22   Elayne Snare, MD  nitroGLYCERIN (NITROSTAT) 0.4 MG SL tablet Place 1 tablet (0.4 mg total) under the tongue every 5 (five) minutes as needed for chest pain (x 3 tabs daily). 09/07/20   [place], Maricela Bo, FNP  potassium chloride SA (KLOR-CON M) 20 MEQ tablet Take 1 tablet (20 mEq total) by mouth daily. Patient not taking: Reported on 05/21/2022 03/15/22    Larey Dresser, MD  sotalol (BETAPACE) 120 MG tablet Take 1 tablet (120 mg total) by mouth 2 (two) times daily. 03/21/21   Rafael Bihari, FNP  torsemide (DEMADEX) 20 MG tablet Take 80 mg by mouth 2 (two) times daily.    [provider]  TRUEPLUS LANCETS 28G MISC 1 each by Does not apply route 3 (three) times daily. Patient not taking: Reported on 05/21/2022 08/22/16   Tresa Garter, MD      Allergies    Shrimp [shellfish allergy], Ace inhibitors, and Other    Review of Systems   Review of Systems  Cardiovascular:  Positive for chest pain.    Physical Exam Updated Vital Signs BP (!) 181/109 (BP Location: Right Arm)   Pulse 74   Temp 98.3 F (36.8 C)   Resp 16   SpO2 97%  Physical Exam  ED Results / Procedures / Treatments   Labs (all labs ordered are listed, but only abnormal results are displayed) Labs Reviewed  COMPREHENSIVE METABOLIC PANEL - Abnormal; Notable for the following components:      Result Value   Chloride 97 (*)    Glucose, Bld 355 (*)    Creatinine, Ser 1.51 (*)    GFR, Estimated 50 (*)    All other components within normal limits  SALICYLATE LEVEL - Abnormal; Notable for the following components:   Salicylate Lvl <0.9 (*)    All other components within normal limits  ACETAMINOPHEN LEVEL - Abnormal; Notable for the following components:   Acetaminophen (Tylenol), Serum <10 (*)    All other components within normal limits  ETHANOL - Abnormal; Notable for the following components:   Alcohol, Ethyl (B) 176 (*)    All other components within normal limits  RAPID URINE DRUG SCREEN, HOSP PERFORMED - Abnormal; Notable for the following components:   Tetrahydrocannabinol POSITIVE (*)    All other components within normal limits  CBC WITH DIFFERENTIAL/PLATELET - Abnormal; Notable for the following components:   RBC 6.48 (*)    Hemoglobin 18.1 (*)    HCT 54.9 (*)    RDW 17.4 (*)    Platelets 134 (*)    All other components within normal  limits  CBG MONITORING, ED - Abnormal; Notable for the following components:   Glucose-Capillary 409 (*)    All other components within normal limits  TROPONIN I (HIGH SENSITIVITY) - Abnormal; Notable for the following components:   Troponin I (High Sensitivity) 23 (*)    All other components within normal limits  TROPONIN I (HIGH SENSITIVITY) - Abnormal; Notable for the following components:   Troponin I (High Sensitivity) 22 (*)    All other components within normal limits    EKG EKG Interpretation  Date/Time:  Friday June 01 2022 23:54:19 EDT Ventricular Rate:  79  PR Interval:  290 QRS Duration: 94 QT Interval:  414 QTC Calculation: 474 R Axis:   50 Text Interpretation: Sinus rhythm with 1st degree A-V block Left ventricular hypertrophy with repolarization abnormality ( Cornell product ) Abnormal ECG Confirmed by Ripley Fraise [phone] on 06/02/2022 10:36:32 AM  Radiology DG Chest 2 View  Result Date: 06/02/2022 CLINICAL DATA:  Chest pain. EXAM: CHEST - 2 VIEW COMPARISON:  Chest radiograph dated 01/21/2022. FINDINGS: Mild cardiomegaly with mild central vascular congestion. No focal consolidation, pleural effusion or pneumothorax. No acute osseous pathology. IMPRESSION: Mild cardiomegaly with mild central vascular congestion. Electronically Signed   By: Anner Crete M.D.   On: 06/02/2022 00:35    Procedures Procedures  {Document cardiac monitor, telemetry assessment procedure when appropriate:1}  Medications Ordered in ED Medications - No data to display  ED Course/ Medical Decision Making/ A&P                           Medical Decision Making  ***  {Document critical care time when appropriate:1} {Document review of labs and clinical decision tools ie heart score, Chads2Vasc2 etc:1}  {Document your independent review of radiology images, and any outside records:1} {Document your discussion with family members, caretakers, and with consultants:1} {Document  social determinants of health affecting pt's care:1} {Document your decision making why or why not admission, treatments were needed:1} Final Clinical Impression(s) / ED Diagnoses Final diagnoses:  None    Rx / DC Orders ED Discharge Orders     None

## 2022-06-02 NOTE — Discharge Instructions (Addendum)
Please return to the emergency department you develop any new or worsening symptoms, difficulty breathing, shortness breath, worsening leg pain.  Please use the incentive spirometer as instructed.  Please follow-up with the orthopedic doctors, I provided you with your phone number however they should be calling you to schedule an appointment.

## 2022-06-02 NOTE — Progress Notes (Signed)
Orthopedic Tech Progress Note Patient Details:  [name] [name] 05-Oct-1955 [id number]  Ortho Devices Type of Ortho Device: Post (long arm) splint, Arm sling Ortho Device/Splint Location: lue Ortho Device/Splint Interventions: Ordered, Application, Adjustment   Post Interventions Patient Tolerated: Well Instructions Provided: Care of device, Adjustment of device  [name] 06/02/2022, 8:15 PM

## 2022-06-04 ENCOUNTER — Telehealth (HOSPITAL_COMMUNITY): Payer: Self-pay

## 2022-06-04 ENCOUNTER — Telehealth: Payer: Self-pay | Admitting: *Deleted

## 2022-06-04 NOTE — Telephone Encounter (Addendum)
Pt aware, agreeable, and verbalized understanding  Follow up scheduled for 11/10 at 3.20pm  ----- Message from Larey Dresser, MD sent at 06/04/2022  2:08 PM EDT ----- Need to get him with me sooner to discuss hypertrophic cardiomyopathy.  ----- Message ----- From: Jerl Mina, RN Sent: 06/04/2022  10:45 AM EDT To: Larey Dresser, MD; [facility] Triage Pool  Has follow on 12/06 with APP, is that ok or do you want him to come in sooner ? ----- Message ----- From: Larey Dresser, MD Sent: 06/03/2022   7:23 PM EDT To: Jerl Mina, RN; [facility] Triage Pool  This study is concerning for hypertrophic cardiomyopathy.  Need to see in office to discuss.

## 2022-06-04 NOTE — Patient Outreach (Signed)
  Care Coordination   06/04/2022 Name: Perkins Molina MRN: [id number] DOB: 1955/10/20   Care Coordination Outreach Attempts:  A third unsuccessful outreach was attempted today to offer the patient with information about available care coordination services as a benefit of their health plan.   Follow Up Plan:  No further outreach attempts will be made at this time. We have been unable to contact the patient to offer or enroll patient in care coordination services  Encounter Outcome:  No Answer  Care Coordination Interventions Activated:  No   Care Coordination Interventions:  No, not indicated     Raina Mina, RN Care Management Coordinator [place] Office [phone]

## 2022-06-05 ENCOUNTER — Ambulatory Visit (INDEPENDENT_AMBULATORY_CARE_PROVIDER_SITE_OTHER): Payer: Medicare Other | Admitting: Orthopaedic Surgery

## 2022-06-05 ENCOUNTER — Encounter: Payer: Self-pay | Admitting: Orthopaedic Surgery

## 2022-06-05 DIAGNOSIS — S62102A Fracture of unspecified carpal bone, left wrist, initial encounter for closed fracture: Secondary | ICD-10-CM | POA: Diagnosis not present

## 2022-06-05 DIAGNOSIS — R0781 Pleurodynia: Secondary | ICD-10-CM

## 2022-06-05 DIAGNOSIS — M25532 Pain in left wrist: Secondary | ICD-10-CM | POA: Diagnosis not present

## 2022-06-05 NOTE — Progress Notes (Signed)
Office Visit Note   Patient: [name] [name]           Date of Birth: [birth year]           MRN: [id number] Visit Date: 06/05/2022              Requested by: [name], MD [place],  [place] [address] PCP: [name], MD  Chief Complaint  Patient presents with   Left Arm - Pain      HPI: [name] [name] is a pleasant 66 year old gentleman who is 3 days status post falling out of a chair at his brother's house.  He had a syncopal episode.  He was taken to the hospital where he had radiographic studies of his head and cervical spine.  He was complaining of right sided chest pain and left wrist discomfort.  He was told he did not have any fractured ribs .denies any shortness of breath.  He was told he had a fracture of his wrist and was placed in a long-arm splint.  He is diabetic.  Assessment & Plan: Visit Diagnoses:  1. Pain in left wrist   2. Wrist fracture, left, closed, initial encounter   3. Rib pain     Plan: [name] [name] presents with his son 3 days status post falling out of a chair in the kitchen.  He was at his brother's house and had a syncopal episode.  EMS was called he was transported to the hospital.  He was fully evaluated with x-rays and CT scans.  He thinks the event may have occurred because of difficulties with his blood pressure.  He does have follow-up with cardiology.  He complained of left wrist pain and right rib pain.  X-rays of his chest did not demonstrate any fractures or pneumothorax.  Left wrist films demonstrated a minimally displaced distal ulnar fracture with arthritis of the radial carpal joint.  Also some arthritis of the first [facility] joint.  Navicular was difficult to visualize and with pain there we recommended a CT scan of the wrist.  We will also provide him a thumb spica splint.  With regards to his ribs he does have tenderness to palpation over the right lower ribs anteriorly .  He does not have any shortness of  breath.  We will treat this symptomatically have encouraged him to try and take deep breaths.  We did review the chest x-ray without obvious rib fracture  Follow-Up Instructions: Return in about 2 weeks (around 06/19/2022), or After CT scan left wrist.   Ortho Exam  Patient is alert, oriented, no adenopathy, well-dressed, normal affect, normal respiratory effort. Examination of his left wrist he has a strong radial pulse.  He has soft tissue swelling most isolated over the ulnar side of the wrist.  Tender to palpation.  He is also tender to palpation over the base of the thumb.  He is able to oppose all his fingers he has brisk capillary refill and sensation is intact.  No tenderness to palpation over the forearm or elbow  Imaging: No results found. No images are attached to the encounter.  Labs: Lab Results  Component Value Date   HGBA1C 10.5 (H) 05/07/2022   HGBA1C 15.0 (H) 01/22/2022   HGBA1C 8.7 (A) 05/05/2021   ESRSEDRATE 72 (H) 03/06/2009   ESRSEDRATE 18 (H) 08/28/2008   ESRSEDRATE 32 (H) 05/04/2008   CRP 1.0 (H) 06/27/2019   LABURIC 9.1 (H) 04/12/2021   LABURIC 9.9 (H)  02/08/2020   LABURIC 4.4 06/08/2010   REPTSTATUS 07/02/2019 FINAL 06/27/2019   GRAMSTAIN  06/07/2015    MODERATE WBC PRESENT,BOTH PMN AND MONONUCLEAR NO ORGANISMS SEEN    CULT  06/27/2019    NO GROWTH 5 DAYS Performed at [facility], [place] [address] [place], [place] [address]    LABORGA ESCHERICHIA COLI (A) 03/13/2019     Lab Results  Component Value Date   ALBUMIN 3.6 06/02/2022   ALBUMIN 3.9 05/07/2022   ALBUMIN 3.9 02/19/2022    Lab Results  Component Value Date   MG 2.5 (H) 03/14/2022   MG 2.2 07/02/2019   MG 1.9 06/27/2019   No results found for: "VD25OH"  No results found for: "PREALBUMIN"    Latest Ref Rng & Units 06/02/2022   12:15 AM 05/07/2022    9:54 AM 02/19/2022   11:22 AM  CBC EXTENDED  WBC 4.0 - 10.5 K/uL 6.7  5.4  5.6   RBC 4.22 - 5.81 MIL/uL 6.48  5.88  5.46    Hemoglobin 13.0 - 17.0 g/dL 18.1  16.6  15.8   HCT 39.0 - 52.0 % 54.9  50.8  48.4   Platelets 150 - 400 K/uL 134  115.0  150.0   NEUT# 1.7 - 7.7 K/uL 2.0  2.3  3.1   Lymph# 0.7 - 4.0 K/uL 3.3  1.9  1.5      There is no height or weight on file to calculate BMI.  Orders:  Orders Placed This Encounter  Procedures   CT WRIST LEFT WO CONTRAST   No orders of the defined types were placed in this encounter.    Procedures: No procedures performed  Clinical Data: No additional findings.  ROS:  All other systems negative, except as noted in the HPI. Review of Systems  Objective: Vital Signs: There were no vitals taken for this visit.  Specialty Comments:  No specialty comments available.  PMFS History: Patient Active Problem List   Diagnosis Date Noted   Wrist fracture, left, closed, initial encounter 06/05/2022   Rib pain 06/05/2022   [place] (type) I [facility]) atrioventricular block 01/23/2022   Chest pain of uncertain etiology 60/45/4098   Syncope 02/09/2020   Generalized weakness 06/27/2019   Acute on chronic congestive heart failure with left ventricular diastolic dysfunction ([place]) 06/26/2019   Anemia due to chronic kidney disease 06/26/2019   CKD (chronic kidney disease), stage III ([place]) 06/26/2019   Type 2 diabetes mellitus without complication ([place]) 11/91/4782   Iron deficiency anemia due to chronic blood loss 06/26/2019   History of atrial fibrillation    History of type 2 diabetes mellitus    Anemia    Chest pain 04/24/2019   Overweight (BMI 25.0-29.9) 02/28/2017   Unstable angina ([place]) 08/03/2016   S/P laparoscopic cholecystectomy 02/07/2016   Coronary artery disease involving native coronary artery with unstable angina pectoris ([place]) 12/29/2015   Diabetes ([place]) 11/12/2015   Chronic anticoagulation 08/22/2015   Atrial flutter with controlled response ([place]) 08/22/2015   RUQ pain    Cholelithiases- drain in place 06/04/2015   Essential hypertension     Chronic diastolic CHF (congestive heart failure) ([place]) 04/24/2015   Excessive daytime sleepiness 11/25/2014   Obstructive sleep apnea 11/25/2014   NICM (nonischemic cardiomyopathy) ([place]) 03/11/2014   Anticoagulation goal of INR 2 to 3 10/19/2013   Eczema 10/19/2013   Low libido 10/16/2013   Persistent atrial fibrillation ([place]) 10/06/2013   Rectal bleeding 12/18/2011   HYPERCHOLESTEROLEMIA 07/11/2010   DENTAL  CARIES 06/30/2010   GOUT 05/05/2009   HEPATITIS B, CHRONIC 12/30/2006   OSTEOCHONDROMA 12/30/2006   Mitral valve disorder 12/30/2006   LIVER FUNCTION TESTS, ABNORMAL, HX OF 12/30/2006   COLONIC POLYPS, HX OF 12/30/2006   COLONOSCOPY, HX OF 05/12/2001   Past Medical History:  Diagnosis Date   Anxiety    CAD (coronary artery disease), native coronary artery    a. Nonobstructive CAD by cath 2013 - diffuse distal and branch vessel CAD, no severe disease in the major coronaries, LV mild global hypokinesis, EF 45%. b. ETT-Sestamibi 5/14: EF 31%, small fixed inferior defect with no ischemia.   Chronic CHF ([place])    a. Mixed ICM/NICM (?EtOH). EF 35% in 2008. Echo 5/13: EF 60-65%, mod LVH, EF 45% on V gram in 12/2011. EF 12/2012: EF 50-55%, mild LVH, inferobasal HK, mild MR. ETT-Ses 5/14 EF 41%. Cardiac MRI 5/14: EF 44%, mild global HK, subepicardial delayed enhancement in nonspecific RV insertion pattern.   COLONIC POLYPS, HX OF 12/30/2006   Gout    H/O atrial flutter 08/13/2005   a. Ablations in 2007, 2008.   Heart murmur    HEPATITIS B, CHRONIC 12/30/2006   History of alcohol abuse    History of hiatal hernia    History of medication noncompliance    HYPERCHOLESTEROLEMIA 07/11/2010   Hypertension    Left sciatic nerve pain since 04/2015   LIVER FUNCTION TESTS, ABNORMAL, HX OF 12/30/2006   MITRAL REGURGITATION 12/30/2006   Osteochondrosarcoma ([place]) 08/13/1970   "left shoulder"   PAF (paroxysmal atrial fibrillation) ([place])    On coumadin   Sleep apnea    "suppose to send mask  but they never did" (05/03/2015)   Type II diabetes mellitus ([place])     Family History  Problem Relation Age of Onset   Diabetes Mother    Hypertension Mother    Heart attack Neg Hx    Stroke Neg Hx     Past Surgical History:  Procedure Laterality Date   A FLUTTER ABLATION  2007, 2008   catheter ablation    CARDIAC CATHETERIZATION N/A 05/03/2015   Procedure: Right/Left Heart Cath and Coronary Angiography;  Surgeon: Larey Dresser, MD;  Location: [facility];  Service: Cardiovascular;  Laterality: N/A;   CARDIAC CATHETERIZATION N/A 05/03/2015   Procedure: Coronary Stent Intervention;  Surgeon: Jettie Booze, MD;  Location: [facility];  Service: Cardiovascular;  Laterality: N/A;   CARDIAC CATHETERIZATION N/A 11/04/2015   Procedure: Left Heart Cath and Coronary Angiography;  Surgeon: Larey Dresser, MD;  Location: [facility];  Service: Cardiovascular;  Laterality: N/A;   CARDIOVERSION N/A 08/24/2015   Procedure: CARDIOVERSION;  Surgeon: Thayer Headings, MD;  Location[facility];  Service: Cardiovascular;  Laterality: N/A;   CHOLECYSTECTOMY N/A 02/07/2016   Procedure: LAPAROSCOPIC CHOLECYSTECTOMY WITH  INTRAOPERATIVE CHOLANGIOGRAM;  Surgeon: Greer Pickerel, MD;  Location: [facility];  Service: General;  Laterality: N/A;   COLONOSCOPY WITH PROPOFOL N/A 04/28/2019   Procedure: COLONOSCOPY WITH PROPOFOL;  Surgeon: Ronnette Juniper, MD;  Location: [facility];  Service: Gastroenterology;  Laterality: N/A;   CORONARY ANGIOPLASTY  12/05/01   ESOPHAGOGASTRODUODENOSCOPY (EGD) WITH PROPOFOL N/A 04/27/2019   Procedure: ESOPHAGOGASTRODUODENOSCOPY (EGD) WITH PROPOFOL;  Surgeon: Ronnette Juniper, MD;  Location: [facility];  Service: Gastroenterology;  Laterality: N/A;   GIVENS CAPSULE STUDY N/A 06/29/2019   Procedure: GIVENS CAPSULE STUDY;  Surgeon: Wilford Corner, MD;  Location: [facility];  Service: Endoscopy;  Laterality: N/A;   LEFT HEART  CATH AND CORONARY ANGIOGRAPHY N/A 12/28/2016    Procedure: Left Heart Cath and Coronary Angiography;  Surgeon: Larey Dresser, MD;  Location: [facility];  Service: Cardiovascular;  Laterality: N/A;   LEFT HEART CATHETERIZATION WITH CORONARY ANGIOGRAM N/A 12/19/2011   Procedure: LEFT HEART CATHETERIZATION WITH CORONARY ANGIOGRAM;  Surgeon: Larey Dresser, MD;  Location: [facility] LAB;  Service: Cardiovascular;  Laterality: N/A;   OSTEOCHONDROMA EXCISION Left 1972   "took bone tumor off my shoulder"   POLYPECTOMY  04/28/2019   Procedure: POLYPECTOMY;  Surgeon: Ronnette Juniper, MD;  Location: [facility] ENDOSCOPY;  Service: Gastroenterology;;   PRESSURE SENSOR/CARDIOMEMS N/A 02/01/2020   Procedure: PRESSURE SENSOR/CARDIOMEMS;  Surgeon: Larey Dresser, MD;  Location: [facility];  Service: Cardiovascular;  Laterality: N/A;   RIGHT HEART CATH N/A 02/01/2020   Procedure: RIGHT HEART CATH;  Surgeon: Larey Dresser, MD;  Location: [facility];  Service: Cardiovascular;  Laterality: N/A;   RIGHT/LEFT HEART CATH AND CORONARY ANGIOGRAPHY N/A 07/01/2019   Procedure: RIGHT/LEFT HEART CATH AND CORONARY ANGIOGRAPHY;  Surgeon: Larey Dresser, MD;  Location: [facility];  Service: Cardiovascular;  Laterality: N/A;   Social History   Occupational History    Employer: UNEMPLOYED  Tobacco Use   Smoking status: Former    Packs/day: 4.00    Years: 18.00    Total pack years: 72.00    Types: Cigarettes    Quit date: 08/14/1983    Years since quitting: 38.8   Smokeless tobacco: Never  Vaping Use   Vaping Use: Never used  Substance and Sexual Activity   Alcohol use: Yes    Alcohol/week: 16.0 standard drinks of alcohol    Types: 6 Cans of beer, 10 Shots of liquor per week    Comment: 2-3 beers watching sports; occasional glass of liquor; drinks heavily in football season   Drug use: Yes    Types: Marijuana    Comment: twice a week   Sexual activity: Never

## 2022-06-06 ENCOUNTER — Ambulatory Visit: Payer: Medicare Other | Admitting: Family Medicine

## 2022-06-08 ENCOUNTER — Other Ambulatory Visit: Payer: Self-pay

## 2022-06-08 DIAGNOSIS — E1165 Type 2 diabetes mellitus with hyperglycemia: Secondary | ICD-10-CM

## 2022-06-08 DIAGNOSIS — G4733 Obstructive sleep apnea (adult) (pediatric): Secondary | ICD-10-CM | POA: Diagnosis not present

## 2022-06-08 MED ORDER — PEN NEEDLES 32G X 4 MM MISC: 1.0000 | Freq: Every day | 3 refills | Status: DC

## 2022-06-18 ENCOUNTER — Telehealth: Payer: Self-pay | Admitting: Orthopaedic Surgery

## 2022-06-18 ENCOUNTER — Other Ambulatory Visit: Payer: Self-pay

## 2022-06-18 DIAGNOSIS — E1165 Type 2 diabetes mellitus with hyperglycemia: Secondary | ICD-10-CM

## 2022-06-18 MED ORDER — FREESTYLE LIBRE 2 SENSOR MISC: 2 refills | Status: DC

## 2022-06-18 NOTE — Telephone Encounter (Signed)
Called patient got recording   mailbox is full and could not leave message     will try back later    Patient need appointment for MRI review with Dr. Durward Fortes

## 2022-06-22 ENCOUNTER — Encounter (HOSPITAL_COMMUNITY): Payer: Medicare Other | Admitting: Cardiology

## 2022-06-27 DIAGNOSIS — G4733 Obstructive sleep apnea (adult) (pediatric): Secondary | ICD-10-CM | POA: Diagnosis not present

## 2022-06-27 DIAGNOSIS — E119 Type 2 diabetes mellitus without complications: Secondary | ICD-10-CM | POA: Diagnosis not present

## 2022-07-02 ENCOUNTER — Ambulatory Visit
Admission: RE | Admit: 2022-07-02 | Discharge: 2022-07-02 | Disposition: A | Discharge status: Home / Self Care | Payer: Medicare Other | Source: Ambulatory Visit | Attending: Orthopaedic Surgery | Admitting: Orthopaedic Surgery

## 2022-07-02 DIAGNOSIS — M25432 Effusion, left wrist: Secondary | ICD-10-CM | POA: Diagnosis not present

## 2022-07-02 DIAGNOSIS — M25532 Pain in left wrist: Secondary | ICD-10-CM

## 2022-07-02 DIAGNOSIS — M19032 Primary osteoarthritis, left wrist: Secondary | ICD-10-CM | POA: Diagnosis not present

## 2022-07-09 ENCOUNTER — Other Ambulatory Visit (HOSPITAL_COMMUNITY): Payer: Self-pay | Admitting: Family Medicine

## 2022-07-16 NOTE — Progress Notes (Incomplete)
Patient ID: [name] [name], male   DOB: 09/03/1955, 66 y.o.   MRN: [id number] PCP: [name], MD HF Cardiology: Dr. Aundra Dubin  66 y.o. with history of HTN, diabetes, paroxysmal atrial fibrillation, and CAD presents for cardiology followup.  He was admitted in 5/13 with chest pain with exertion.  LHC was done showing global HK with EF 45% and diffuse, severe distal and branch vessel disease.  There was no interventional option, but it was suspect that this disease could be causing his anginal-type pain.  Echo at that time was read as showing EF 60-65%.  He was admitted in 5/14 with hypertensive urgency and chest pain.  He ruled out for MI and BP was controlled.  His EF was 50-55% by echo.  ETT-Sestamibi done as outpatient showed small fixed inferior defect with no ischemia but EF was 31%.  He was quite hypertensive during the study.  Cardiac MRI done to confirm EF in 5/14 showed EF 44%.  He had an episode of atrial fibrillation/RVR in 3/15 but went back into NSR.  Echo in 2/16 showed EF 55-60% with moderate diastolic dysfunction.   He had a Cardiolite in 9/16 showing inferior ischemia.  He was having exertional dyspnea with less activity than normal.  There was concern for worsened CAD and he was sent for [facility] in 9/16. By RHC, filling pressures were not significantly elevated. He had a severe distal RCA stenosis that was treated with DES x 2.  Echo showed EF 55-60%.    In 10/16, he was admitted with acute cholecystitis.  He received a cholecystostomy tube.  Cholecystectomy done in 6/17.   Atrial fibrillation in 1/17, cardioverted to NSR.   Recurrent concerning chest pain in 3/17.  Repeat LHC showed diffuse distal and branch vessel disease consistent with poorly-controlled DM, but patent RCA stents.    He was admitted with atrial fibrillation in 12/17. Started on sotalol and converted to NSR.    He had recurrent chest pain in 5/18 with abnormal Cardiolite.  Cath was done again in 5/18,  showing diffuse CAD with no good interventional options.   Admitted 11/20 with increased dyspnea and exertional chest pain. Hgb went down to 8.1. GI consulted.  Had [place] that showed severe distal and branch vessel disease but no interventional targets and not good anatomy for CABG. Continued on medical management. Capsule endoscopy without recent or active bleeding, no AVMs. He went into atrial flutter during his hospitalization. He went back into NSR, however, after discharge.  +Snoring and daytime sleepiness.   He has had episodes of syncope associated with vigorous coughing.   He was in the ER with atrial flutter and dyspnea in 4/21. Zio monitor in 9/22 showed NSR with PACs.   Follow up 1/23 not compliant with medications or Cardiomems.   Follow up 5/23, continued to miss medications, not using Cardiomems. Mild volume overload, instructed to restart meds.  Evaluated in ED 01/14/22 for cough, found to be COVID +. Received molnupiravir.  Admitted 01/22/22 with CP. Cards consulted, troponin negative x 2, Echo showed EF 55-60%, severe asymmetric LV hypertrophy, normal RV. No new WMA and CP resolved, so LHC deferred.  Coreg decreased to 12.5 bid due to low BP and Mobitz Type 1. Discharged home, weight 200 lbs.   Today he returns for followup of CHF and CAD.  Now has paramedicine but still having trouble taking his evening meds.  Not using Cardiomems.  Says that he is having difficulty using his CPAP mask  since some of the parts were changed.  No dyspnea walking on flat ground. Occasional "twinges" of chest pain, not exertional and last for seconds only.  Occasional lightheadedness if he stands too fast. Weight down 4 lbs.   ECG (personally reviewed): NSR, 1st degree AVB, PACs, QTc 502 msec  Cardiomems: No readings x months.  Labs (5/13): K 3.6, creatinine 0.93, LDL 85, HDL 57 Labs (7/13): K 3.9, creatinine 1.1, LDL 57, HDL 51  Labs (5/14): K 4, creatinine 1.25 Labs (8/14): K 3.7, creatinine 0.9,  LDL 103, HDL 42 Labs (8/15): K 3.5 creatinine 1.0, BNP 75 Labs (2/16): K 3.8, creatinine 1.1 Labs (9/16): K 3.5, creatinine 1.03, BNP 59, HDL 40, LDL 111, TGs 333 Labs (11/16): K 3.6, creaitnine 1.07, LFTs normal, LDL 44 Labs (6/17): K 3.4, creatinine 1.52 => 1.13 Labs (1/18): K 3.9, creatinine 1.25 Labs (6/18): K 3.8, creatinine 1.21, HCT 39.7 Labs (8/18): LDL 39, HDL 43 Labs (12/18): 4.4, creatinine 1.25, LFTs normal Labs (8/19): K 3.6, creatinine 1.13, LDL 68, HDL 49  Labs (11/20): K 3.5, creatinine 1.33, LDL 25 Labs (4/21): K 4.0, SCr 1.14, BNP 266  Labs (6/21): K 3.7, creatinine 1.25 Labs (12/21): LDL 25 Labs (1/22): BNP 132 Labs (2/22): K 3.4, creatinine 1.44 Labs (8/22): LDL 81 Labs (9/22): K 3.2, creatinine 1.53 Labs (10/22): K 3.7, creatinine 1.39 Labs (11/22): K 4.4, creatinine 1.47 Labs (3/23): K 4.4, creatinine 1.44 Labs (5/23): K 4.2, creatinine 1.78 Labs (6/23): K 4.1, creatinine 1.24, hgb 15.8 Labs (7/23): K 4.1, creatinine 1.55  PMH: 1. DM2 2. Gout 3. HTN: Cough with ACEI.  4. Cardiomyopathy: Suspect mixed ischemic/nonischemic (?ETOH-related).  EF 35% in 2008.  Echo in 5/13 showed EF 60-65% with moderate LVH but EF was 45% on LV-gram in 5/13.  Echo (5/14) with EF 50-55%, mild LVH, inferobasal HK, mild MR.  ETT-Sestamibi in 5/14, however, showed EF 31%.  Cardiac MRI (6/14) with EF 44%, mild global hypokinesis, subepicardial delayed enhancement in a nonspecific RV insertion site pattern.  Echo (2/16) with EF 55-60%, moderate diastolic dysfunction. RHC (9/16) with mean RA 8, PA 39/20 mean 28, mean PCWP 14, CI 2.2. Echo (9/16) with EF 55-60%, grade II diastolic dysfunction.  - Echo (6/17) with EF 55-60%, moderate LVH, grade II diastolic dysfunction, mild AI, mild MR.  - RHC (11/20): mean RA 4, PA 46/14 mean 26, mean PCWP 13, CI 3.23 - Echo (9/20): EF 60-65%, moderate LVH, normal RV size and systolic function, mild-moderate MR, mild AI.  - Echo (5/21): EF 55%, RV  normal - Has Cardiomems.  - Echo (6/23): EF 55-60%, severe asymmetric LV hypertrophy, normal RV. 5. CAD: LHC in 2010 with mild nonobstructive disease.  LHC (5/13) with diffuse distal and branch vessel disease, mild global hypokinesis and EF 45%.  ETT-Sestamibi (5/14): EF 31%, small fixed inferior defect with no ischemia.  Lexiscan Cardiolite (9/16) with EF 26%, moderate inferior defect that was partially reversible, suggesting ischemia => High risk study. LHC (9/16) with 40-50% mLAD, diffuse up to 50% distal LAD, 90% ostium of branch off ramus, 95% dRCA => DES to RCA x 2, EF 55-60%.   - LHC (3/17) with diffuse branch and distal vessel disease c/w poorly controlled DM, RCA stents patent.  - LHC (5/18): Diffuse CAD with no good interventional option. 60% mRCA, 90% dLAD, 50% serial mid ramus stenoses, ramus branches with ostial 80-90% stenoses. - LHC (11/20): Severe distal and branch vessel disease but no interventional targets and not good anatomy for  CABG  6. H/o chronic HBV 7. Osteochondroma left shoulder.  8. Hyperlipidemia 9. Paroxysmal atrial fibrillation: Coumadin.  Developed cough and increased ESR with amiodarone.  DCCV in 1/17.  Recurrent atrial fibrillation in 12/17, sotalol started.  10. Atrial flutter: had ablations in 2007 and 2008.  - Zio patch in 4/21 with 20% atrial flutter.  11. OSA on CPAP.  12. Acute cholecystitis (10/16): Cholecystostomy tube placed.  Cholecystectomy 6/17.  13. CKD 14. Anemia - Suspected GI bleed but EGD/c-scope 9/20 without significant findings.  - Capsule Endoscopy (11/20) without recent or active bleeding, no AVMs.  15. Syncope: Suspected cough syncope.  16. OSA  SH: Married, prior smoker.  Some ETOH, occasionally heavy in past.  Does use occasional marijuana. Out of work Engineer, drilling. 2 daughters in grad school.   FH: CAD  ROS: All systems reviewed and negative except as per HPI.   Current Outpatient Medications  Medication Sig Dispense Refill    ACCU-CHEK SOFTCLIX LANCETS lancets Use as instructed for three times daily testing of blood glucose 100 each 12   acetaminophen (TYLENOL) 500 MG tablet Take 1,000 mg by mouth every 6 (six) hours as needed (pain).     albuterol (VENTOLIN HFA) 108 (90 Base) MCG/ACT inhaler Inhale 1-2 puffs into the lungs every 6 (six) hours as needed for wheezing or shortness of breath. 8 g 0   apixaban (ELIQUIS) 5 MG TABS tablet Take 1 tablet (5 mg total) by mouth 2 (two) times daily. 60 tablet 6   atorvastatin (LIPITOR) 80 MG tablet Take 1 tablet by mouth once daily 90 tablet 3   blood glucose meter kit and supplies KIT Dispense based on patient and insurance preference. Use up to four times daily as directed. (FOR ICD-9 250.00, 250.01). 1 each 0   Blood Glucose Monitoring Suppl (ACCU-CHEK AVIVA PLUS) w/Device KIT Use as directed for 3 times daily testing of blood glucose 1 kit 0   carvedilol (COREG) 12.5 MG tablet Take 1 tablet (12.5 mg total) by mouth 2 (two) times daily with a meal. 60 tablet 2   colchicine 0.6 MG tablet On day 1 take 2 tablets at first sign of gout flare.  Take 1 tablet 1 hour later.  Then take 1 tablet daily starting the next day until resolution of gout flare. 10 tablet 1   Continuous Blood Gluc Receiver (FREESTYLE LIBRE 2 READER) DEVI Check your blood sugar three times per day before meals and once before bedtime 2 each 11   Continuous Blood Gluc Sensor (FREESTYLE LIBRE 2 SENSOR) MISC Apply one sensor to upper arm every 2 weeks 2 each 2   cyclobenzaprine (FLEXERIL) 10 MG tablet Take 1 tablet (10 mg total) by mouth 2 (two) times daily as needed for muscle spasms. 20 tablet 0   empagliflozin (JARDIANCE) 25 MG TABS tablet Take 1 tablet (25 mg total) by mouth daily before breakfast. 30 tablet 3   eplerenone (INSPRA) 25 MG tablet Take 1 tablet by mouth once daily 30 tablet 0   glucose blood (ACCU-CHEK AVIVA PLUS) test strip Use as instructed for 3 times daily testing of blood glucose 100 each 12    hydrALAZINE (APRESOLINE) 50 MG tablet TAKE 1 & 1/2 (ONE & ONE-HALF) TABLETS BY MOUTH TWICE DAILY . 250 tablet 3   insulin glargine (LANTUS SOLOSTAR) 100 UNIT/ML Solostar Pen Inject 40 Units into the skin every morning. And pen needles 1/day 45 mL 3   insulin lispro (HUMALOG KWIKPEN) 100 UNIT/ML KwikPen Inject 6 Units into  the skin with breakfast, with lunch, and with evening meal. (Patient not taking: Reported on 05/21/2022) 15 mL 11   Insulin Pen Needle (PEN NEEDLES) 32G X 4 MM MISC 1 each by Does not apply route daily. 100 each 3   isosorbide mononitrate (IMDUR) 120 MG 24 hr tablet Take 1 tablet by mouth once daily 30 tablet 11   Lancet Devices (ACCU-CHEK SOFTCLIX) lancets Use as instructed for 3 times daily testing of blood glucose 1 each 0   metFORMIN (GLUCOPHAGE) 1000 MG tablet Take 1,000 mg by mouth daily with breakfast. 30 tablet 0   nitroGLYCERIN (NITROSTAT) 0.4 MG SL tablet Place 1 tablet (0.4 mg total) under the tongue every 5 (five) minutes as needed for chest pain (x 3 tabs daily). 60 tablet 1   potassium chloride SA (KLOR-CON M) 20 MEQ tablet Take 1 tablet (20 mEq total) by mouth daily. (Patient not taking: Reported on 05/21/2022) 30 tablet 6   sotalol (BETAPACE) 120 MG tablet Take 1 tablet (120 mg total) by mouth 2 (two) times daily. 180 tablet 3   torsemide (DEMADEX) 20 MG tablet Take 80 mg by mouth 2 (two) times daily.     TRUEPLUS LANCETS 28G MISC 1 each by Does not apply route 3 (three) times daily. (Patient not taking: Reported on 05/21/2022) 100 each 12   No current facility-administered medications for this visit.   There were no vitals taken for this visit.   Wt Readings from Last 3 Encounters:  05/21/22 91.6 kg (202 lb)  05/07/22 94.3 kg (207 lb 12.8 oz)  04/23/22 93.4 kg (206 lb)   PHYSICAL EXAM: General: NAD Neck: No JVD, no thyromegaly or thyroid nodule.  Lungs: Clear to auscultation bilaterally with normal respiratory effort. CV: Nondisplaced PMI.  Heart regular  S1/S2, no S3/S4, no murmur.  No peripheral edema.  No carotid bruit.  Normal pedal pulses.  Abdomen: Soft, nontender, no hepatosplenomegaly, no distention.  Skin: Intact without lesions or rashes.  Neurologic: Alert and oriented x 3.  Psych: Normal affect. Extremities: No clubbing or cyanosis.  HEENT: Normal.   Assessment/Plan: 1. Chronic diastolic CHF: EF 83% on cardiac MRI in 6/14 but EF improved back to 55-60% on 2/16 echo.  However, EF down to 26% on Cardiolite in 9/16.  Echo in 9/20 showed EF 60-65%, moderate LVH, normal RV, mild-moderate MR.  Echo in 5/21 with EF 55%, normal RV.  He has a Cardiomems device, but has not used in months. He does not appear volume overloaded on exam and weight is down. Echo in 6/23 showed EF 55-60% with severe asymmetric septal hypertrophy.  Has paramedicine following but still not compliant with meds. NYHA class II symptoms. We discussed importance of taking his evening meds.  - Continue eplerenone 25 mg daily (gynecomastia with spiro in past). BMET/BNP today. - Continue torsemide 80 mg bid.  - Continue empagliflozin 10 mg daily.  - Continue hydralazine 75 mg bid (take the pm dose) + Imdur 120 mg daily. - Continue carvedilol 12.5 mg bid (take the pm dose).  - With asymmetric septal hypertrophy, I will get cardiac MRI to assess for evidence for hypertrophic cardiomyopathy.  - I encouraged him to send Cardiomems at least 2-3 times/week, he says he will try.   - Paramedicine now on board, appreciate their assistance. 2. Atrial fibrillation/flutter: Paroxysmal. He has had atrial flutter ablations x 2 remotely.  He is on sotalol. He tolerates atrial arrhythmias poorly, seems to worsen CHF.  Zio patch in 4/21 with  20% atrial flutter. He saw EP, repeat ablation was not recommended. Repeat Zio in 9/22 showed only NSR and PADs.   - Continue sotalol, QTc is prolonged at 502 msec but will not change sotalol.  Check K and Mg today.   - Continue Eliquis.  3. Coronary  artery disease: LHC in 9/16 showed diffuse CAD, most significant involving the distal RCA.  Patient had DES x 2 to RCA.  Cath in 3/17 with patent RCA stents and diffuse distal and branch vessel disease concerning for poorly controlled diabetes.  Cath in 5/18 again showed diffuse CAD without good interventional target.  Cath in 11/20 showed severe distal and branch vessel disease but no interventional targets and not good anatomy for CABG.  No further chest pain. - No ASA given Eliquis use.  - Continue atorvastatin 80 mg daily. - Continue Coreg and Imdur daily for angina control.  - Avoid ranolazine with sotalol use.  4. Hyperlipidemia:  On atorvastatin, good lipids 1/23.   5. Hypertension: BP controlled today.  6. OSA: Having trouble with CPAP, will ask sleep medicine to contact him. 7. CKD stable 3: BMET today.  8. DM: Per PCP.  9. Non-compliance: Not taking evening medications regularly. Paramedicine now helping. - Difficult situation. Discussed importance of medication regimen.  Followup 4 months with APP.   [place] 07/16/2022

## 2022-07-18 ENCOUNTER — Encounter (HOSPITAL_COMMUNITY): Payer: Self-pay

## 2022-07-18 ENCOUNTER — Ambulatory Visit (HOSPITAL_COMMUNITY)
Admission: RE | Admit: 2022-07-18 | Discharge: 2022-07-18 | Disposition: A | Discharge status: Home / Self Care | Payer: Medicare Other | Source: Ambulatory Visit | Attending: Family Medicine | Admitting: Family Medicine

## 2022-07-18 VITALS — BP 150/90 | HR 75 | Wt 203.0 lb

## 2022-07-18 DIAGNOSIS — E785 Hyperlipidemia, unspecified: Secondary | ICD-10-CM | POA: Insufficient documentation

## 2022-07-18 DIAGNOSIS — N183 Chronic kidney disease, stage 3 unspecified: Secondary | ICD-10-CM | POA: Diagnosis not present

## 2022-07-18 DIAGNOSIS — Z7984 Long term (current) use of oral hypoglycemic drugs: Secondary | ICD-10-CM | POA: Diagnosis not present

## 2022-07-18 DIAGNOSIS — I5032 Chronic diastolic (congestive) heart failure: Secondary | ICD-10-CM | POA: Insufficient documentation

## 2022-07-18 DIAGNOSIS — I2510 Atherosclerotic heart disease of native coronary artery without angina pectoris: Secondary | ICD-10-CM

## 2022-07-18 DIAGNOSIS — I130 Hypertensive heart and chronic kidney disease with heart failure and stage 1 through stage 4 chronic kidney disease, or unspecified chronic kidney disease: Secondary | ICD-10-CM | POA: Insufficient documentation

## 2022-07-18 DIAGNOSIS — I25110 Atherosclerotic heart disease of native coronary artery with unstable angina pectoris: Secondary | ICD-10-CM

## 2022-07-18 DIAGNOSIS — Z7901 Long term (current) use of anticoagulants: Secondary | ICD-10-CM | POA: Insufficient documentation

## 2022-07-18 DIAGNOSIS — E1122 Type 2 diabetes mellitus with diabetic chronic kidney disease: Secondary | ICD-10-CM | POA: Diagnosis not present

## 2022-07-18 DIAGNOSIS — Z79899 Other long term (current) drug therapy: Secondary | ICD-10-CM | POA: Insufficient documentation

## 2022-07-18 DIAGNOSIS — Z794 Long term (current) use of insulin: Secondary | ICD-10-CM | POA: Insufficient documentation

## 2022-07-18 DIAGNOSIS — I10 Essential (primary) hypertension: Secondary | ICD-10-CM | POA: Diagnosis not present

## 2022-07-18 DIAGNOSIS — N1831 Chronic kidney disease, stage 3a: Secondary | ICD-10-CM

## 2022-07-18 DIAGNOSIS — Z8639 Personal history of other endocrine, nutritional and metabolic disease: Secondary | ICD-10-CM

## 2022-07-18 DIAGNOSIS — Z955 Presence of coronary angioplasty implant and graft: Secondary | ICD-10-CM | POA: Diagnosis not present

## 2022-07-18 DIAGNOSIS — R0602 Shortness of breath: Secondary | ICD-10-CM | POA: Diagnosis not present

## 2022-07-18 DIAGNOSIS — I480 Paroxysmal atrial fibrillation: Secondary | ICD-10-CM | POA: Insufficient documentation

## 2022-07-18 DIAGNOSIS — Z91148 Patient's other noncompliance with medication regimen for other reason: Secondary | ICD-10-CM | POA: Insufficient documentation

## 2022-07-18 DIAGNOSIS — I25119 Atherosclerotic heart disease of native coronary artery with unspecified angina pectoris: Secondary | ICD-10-CM | POA: Diagnosis not present

## 2022-07-18 DIAGNOSIS — G4733 Obstructive sleep apnea (adult) (pediatric): Secondary | ICD-10-CM | POA: Diagnosis not present

## 2022-07-18 DIAGNOSIS — I4892 Unspecified atrial flutter: Secondary | ICD-10-CM | POA: Diagnosis not present

## 2022-07-18 LAB — BASIC METABOLIC PANEL
Anion gap: 11 (ref 5–15)
BUN: 22 mg/dL (ref 8–23)
CO2: 23 mmol/L (ref 22–32)
Calcium: 9 mg/dL (ref 8.9–10.3)
Chloride: 102 mmol/L (ref 98–111)
Creatinine, Ser: 1.38 mg/dL — ABNORMAL HIGH (ref 0.61–1.24)
GFR, Estimated: 56 mL/min — ABNORMAL LOW (ref >60)
Glucose, Bld: 228 mg/dL — ABNORMAL HIGH (ref 70–99)
Potassium: 3.9 mmol/L (ref 3.5–5.1)
Sodium: 136 mmol/L (ref 135–145)

## 2022-07-18 LAB — MAGNESIUM: Magnesium: 2.3 mg/dL (ref 1.7–2.4)

## 2022-07-18 NOTE — Patient Instructions (Signed)
There has been no changes to your medications.  Labs done today, your results will be available in MyChart, we will contact you for abnormal readings.  Genetic test has been done, this has to be sent to [place] to be processed and can take 1-2 weeks to get results back.  We will let you know the results.  Your physician recommends that you schedule a follow-up appointment in: 3 months   If you have any questions or concerns before your next appointment please send [place] a message through [place] or call our office at [phone].    TO LEAVE A MESSAGE FOR THE NURSE SELECT OPTION 2, PLEASE LEAVE A MESSAGE INCLUDING: YOUR NAME DATE OF BIRTH CALL BACK NUMBER REASON FOR CALL**this is important as we prioritize the call backs  YOU WILL RECEIVE A CALL BACK THE SAME DAY AS LONG AS YOU CALL BEFORE 4:00 PM  At the [facility], you and your health needs are our priority. As part of our continuing mission to provide you with exceptional heart care, we have created designated Provider Care Teams. These Care Teams include your primary Cardiologist (physician) and Advanced Practice Providers (APPs- Physician Assistants and Nurse Practitioners) who all work together to provide you with the care you need, when you need it.   You may see any of the following providers on your designated Care Team at your next follow up: Dr Glori Bickers Dr Loralie Champagne Dr. Roxana Hires, NP Lyda Jester, [place] [facility] [place], [place] Forestine Na, NP Audry Riles, PharmD   Please be sure to bring in all your medications bottles to every appointment.

## 2022-07-18 NOTE — Progress Notes (Signed)
Blood collected for TTR genetic testing per [facility] FNP.  Order form completed and both shipped by Fed[employer] to [facility].

## 2022-07-18 NOTE — Addendum Note (Signed)
Encounter addended by: Rafael Bihari, FNP on: 07/18/2022 1:56 PM  Actions taken: Clinical Note Signed

## 2022-07-23 ENCOUNTER — Ambulatory Visit (INDEPENDENT_AMBULATORY_CARE_PROVIDER_SITE_OTHER): Payer: Medicare Other

## 2022-07-23 VITALS — BP 124/62 | HR 62 | Ht 71.0 in | Wt 200.7 lb

## 2022-07-23 DIAGNOSIS — Z0000 Encounter for general adult medical examination without abnormal findings: Secondary | ICD-10-CM

## 2022-07-23 NOTE — Progress Notes (Signed)
Subjective:   [name] [name] is a 66 y.o. male who presents for Medicare Annual/Subsequent preventive examination.  Review of Systems     Cardiac Risk Factors include: advanced age (>58mn, >>48women);diabetes mellitus;male gender     Objective:    Today's Vitals   07/23/22 1413  BP: 124/62  Pulse: 62  SpO2: 95%  Weight: 200 lb 11.2 oz (91 kg)  Height: _0  (1.803 m)   Body mass index is 27.99 kg/m.     07/23/2022    2:34 PM 01/22/2022    9:15 PM 01/21/2022    5:20 PM 01/13/2022    9:37 PM 07/19/2021    2:23 PM 04/28/2021    2:17 PM 07/18/2020    2:08 PM  Advanced Directives  Does Patient Have a Medical Advance Directive? _1  No No  Does patient want to make changes to medical advance directive?       No - Patient declined  Would patient like information on creating a medical advance directive? No - Patient declined Yes (Inpatient - patient defers creating a medical advance directive at this time - Information given) No - Patient declined  No - Patient declined Yes (ED - Information included in AVS)     Current Medications (verified) Outpatient Encounter Medications as of 07/23/2022  Medication Sig   ACCU-CHEK SOFTCLIX LANCETS lancets Use as instructed for three times daily testing of blood glucose   acetaminophen (TYLENOL) 500 MG tablet Take 1,000 mg by mouth every 6 (six) hours as needed (pain).   albuterol (VENTOLIN HFA) 108 (90 Base) MCG/ACT inhaler Inhale 1-2 puffs into the lungs every 6 (six) hours as needed for wheezing or shortness of breath.   apixaban (ELIQUIS) 5 MG TABS tablet Take 1 tablet (5 mg total) by mouth 2 (two) times daily.   atorvastatin (LIPITOR) 80 MG tablet Take 1 tablet by mouth once daily   blood glucose meter kit and supplies KIT Dispense based on patient and insurance preference. Use up to four times daily as directed. (FOR ICD-9 250.00, 250.01).   Blood Glucose Monitoring Suppl (ACCU-CHEK AVIVA PLUS) w/Device KIT Use as  directed for 3 times daily testing of blood glucose   carvedilol (COREG) 12.5 MG tablet Take 1 tablet (12.5 mg total) by mouth 2 (two) times daily with a meal. (Patient taking differently: Take 6.125 mg by mouth 2 (two) times daily with a meal.)   colchicine 0.6 MG tablet On day 1 take 2 tablets at first sign of gout flare.  Take 1 tablet 1 hour later.  Then take 1 tablet daily starting the next day until resolution of gout flare.   Continuous Blood Gluc Receiver (FREESTYLE LIBRE 2 READER) DEVI Check your blood sugar three times per day before meals and once before bedtime   Continuous Blood Gluc Sensor (FREESTYLE LIBRE 2 SENSOR) MISC Apply one sensor to upper arm every 2 weeks   cyclobenzaprine (FLEXERIL) 10 MG tablet Take 1 tablet (10 mg total) by mouth 2 (two) times daily as needed for muscle spasms.   empagliflozin (JARDIANCE) 25 MG TABS tablet Take 1 tablet (25 mg total) by mouth daily before breakfast.   eplerenone (INSPRA) 25 MG tablet Take 1 tablet by mouth once daily   glucose blood (ACCU-CHEK AVIVA PLUS) test strip Use as instructed for 3 times daily testing of blood glucose   hydrALAZINE (APRESOLINE) 50 MG tablet TAKE 1 & 1/2 (ONE & ONE-HALF) TABLETS BY MOUTH TWICE DAILY .  insulin glargine (LANTUS SOLOSTAR) 100 UNIT/ML Solostar Pen Inject 40 Units into the skin every morning. And pen needles 1/day (Patient taking differently: Inject 50 Units into the skin every morning. And pen needles 1/day)   insulin lispro (HUMALOG KWIKPEN) 100 UNIT/ML KwikPen Inject 6 Units into the skin with breakfast, with lunch, and with evening meal.   Insulin Pen Needle (PEN NEEDLES) 32G X 4 MM MISC 1 each by Does not apply route daily.   isosorbide mononitrate (IMDUR) 120 MG 24 hr tablet Take 1 tablet by mouth once daily   Lancet Devices (ACCU-CHEK SOFTCLIX) lancets Use as instructed for 3 times daily testing of blood glucose   metFORMIN (GLUCOPHAGE) 1000 MG tablet Take 1,000 mg by mouth daily with breakfast.    nitroGLYCERIN (NITROSTAT) 0.4 MG SL tablet Place 1 tablet (0.4 mg total) under the tongue every 5 (five) minutes as needed for chest pain (x 3 tabs daily).   potassium chloride SA (KLOR-CON M) 20 MEQ tablet Take 1 tablet (20 mEq total) by mouth daily. (Patient not taking: Reported on 05/21/2022)   sotalol (BETAPACE) 120 MG tablet Take 1 tablet (120 mg total) by mouth 2 (two) times daily.   torsemide (DEMADEX) 20 MG tablet Take 80 mg by mouth 2 (two) times daily.   TRUEPLUS LANCETS 28G MISC 1 each by Does not apply route 3 (three) times daily.   No facility-administered encounter medications on file as of 07/23/2022.    Allergies (verified) Shrimp [shellfish allergy], Ace inhibitors, and Other   History: Past Medical History:  Diagnosis Date   Anxiety    CAD (coronary artery disease), native coronary artery    a. Nonobstructive CAD by cath 2013 - diffuse distal and branch vessel CAD, no severe disease in the major coronaries, LV mild global hypokinesis, EF 45%. b. ETT-Sestamibi 5/14: EF 31%, small fixed inferior defect with no ischemia.   Chronic CHF ([place])    a. Mixed ICM/NICM (?EtOH). EF 35% in 2008. Echo 5/13: EF 60-65%, mod LVH, EF 45% on V gram in 12/2011. EF 12/2012: EF 50-55%, mild LVH, inferobasal HK, mild MR. ETT-Ses 5/14 EF 41%. Cardiac MRI 5/14: EF 44%, mild global HK, subepicardial delayed enhancement in nonspecific RV insertion pattern.   COLONIC POLYPS, HX OF 12/30/2006   Gout    H/O atrial flutter 08/13/2005   a. Ablations in 2007, 2008.   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/13/1970   "left shoulder"   PAF (paroxysmal atrial fibrillation)  ([place])    On coumadin   Sleep apnea    "suppose to send mask but they never did" (05/03/2015)   Type II diabetes mellitus ([place])    Past Surgical History:  Procedure Laterality Date   A FLUTTER ABLATION  2007, 2008   catheter ablation    CARDIAC CATHETERIZATION N/A 05/03/2015   Procedure: Right/Left Heart Cath and Coronary Angiography;  Surgeon: Larey Dresser, MD;  Location: [place] [facility];  Service: Cardiovascular;  Laterality: N/A;   CARDIAC CATHETERIZATION N/A 05/03/2015   Procedure: Coronary Stent Intervention;  Surgeon: Jettie Booze, MD;  Location: [place] [facility];  Service:  Cardiovascular;  Laterality: N/A;   CARDIAC CATHETERIZATION N/A 11/04/2015   Procedure: Left Heart Cath and Coronary Angiography;  Surgeon: Larey Dresser, MD;  Location: [place] [facility];  Service: Cardiovascular;  Laterality: N/A;   CARDIOVERSION N/A 08/24/2015   Procedure: CARDIOVERSION;  Surgeon: Thayer Headings, MD;  Location: [facility] ENDOSCOPY;  Service: Cardiovascular;  Laterality: N/A;   CHOLECYSTECTOMY N/A 02/07/2016   Procedure: LAPAROSCOPIC CHOLECYSTECTOMY WITH  INTRAOPERATIVE CHOLANGIOGRAM;  Surgeon: Greer Pickerel, MD;  Location: [facility];  Service: General;  Laterality: N/A;   COLONOSCOPY WITH PROPOFOL N/A 04/28/2019   Procedure: COLONOSCOPY WITH PROPOFOL;  Surgeon: Ronnette Juniper, MD;  Location: [place];  Service: Gastroenterology;  Laterality: N/A;   CORONARY ANGIOPLASTY  12/05/01   ESOPHAGOGASTRODUODENOSCOPY (EGD) WITH PROPOFOL N/A 04/27/2019   Procedure: ESOPHAGOGASTRODUODENOSCOPY (EGD) WITH PROPOFOL;  Surgeon: Ronnette Juniper, MD;  Location: [place];  Service: Gastroenterology;  Laterality: N/A;   GIVENS CAPSULE STUDY N/A 06/29/2019   Procedure: GIVENS CAPSULE STUDY;  Surgeon: Wilford Corner, MD;  Location: [place];  Service: Endoscopy;  Laterality: N/A;   LEFT HEART CATH AND CORONARY ANGIOGRAPHY N/A 12/28/2016   Procedure: Left Heart Cath and Coronary Angiography;  Surgeon: Larey Dresser, MD;  Location: [place] [facility];  Service: Cardiovascular;  Laterality: N/A;   LEFT HEART CATHETERIZATION WITH CORONARY ANGIOGRAM N/A 12/19/2011   Procedure: LEFT HEART CATHETERIZATION WITH CORONARY ANGIOGRAM;  Surgeon: Larey Dresser, MD;  Location: [facility] CATH LAB;  Service: Cardiovascular;  Laterality: N/A;   OSTEOCHONDROMA EXCISION Left 1972   "took bone tumor off my shoulder"   POLYPECTOMY  04/28/2019   Procedure: POLYPECTOMY;  Surgeon: Ronnette Juniper, MD;  Location: [facility];  Service: Gastroenterology;;   PRESSURE SENSOR/CARDIOMEMS N/A 02/01/2020   Procedure: PRESSURE SENSOR/CARDIOMEMS;  Surgeon: Larey Dresser, MD;  Location: [place] [facility];  Service: Cardiovascular;  Laterality: N/A;   RIGHT HEART CATH N/A 02/01/2020   Procedure: RIGHT HEART CATH;  Surgeon: Larey Dresser, MD;  Location: [place] [facility];  Service: Cardiovascular;  Laterality: N/A;   RIGHT/LEFT HEART CATH AND CORONARY ANGIOGRAPHY N/A 07/01/2019   Procedure: RIGHT/LEFT HEART CATH AND CORONARY ANGIOGRAPHY;  Surgeon: Larey Dresser, MD;  Location: [place] [facility];  Service: Cardiovascular;  Laterality: N/A;   Family History  Problem Relation Age of Onset   Diabetes Mother    Hypertension Mother    Heart attack Neg Hx    Stroke Neg Hx    Social History   Socioeconomic History   Marital status: Widowed    Spouse name: Not on file   Number of children: 2   Years of education: 14   Highest education level: Some college, no degree  Occupational History    Employer: UNEMPLOYED  Tobacco Use   Smoking status: Former    Packs/day: 4.00    Years: 18.00    Total pack years: 72.00    Types: Cigarettes    Quit date: 08/14/1983    Years since quitting: 38.9   Smokeless tobacco: Never  Vaping Use   Vaping Use: Never used  Substance and Sexual Activity   Alcohol use: Yes    Alcohol/week: 16.0 standard drinks of alcohol    Types: 6 Cans of beer, 10 Shots of liquor per week    Comment: 2-3 beers  watching sports; occasional glass of liquor; drinks heavily in football season   Drug use: Yes    Types: Marijuana    Comment: twice a week   Sexual  activity: Never  Other Topics Concern   Not on file  Social History Narrative   Not on file   Social Determinants of Health   Financial Resource Strain: Low Risk  (07/23/2022)   Overall Financial Resource Strain (CARDIA)    Difficulty of Paying Living Expenses: Not hard at all  Food Insecurity: No Food Insecurity (07/23/2022)   Hunger Vital Sign    Worried About Running Out of Food in the Last Year: Never true    Ran Out of Food in the Last Year: Never true  Transportation Needs: No Transportation Needs (07/23/2022)   PRAPARE - Hydrologist (Medical): No    Lack of Transportation (Non-Medical): No  Physical Activity: Inactive (07/23/2022)   Exercise Vital Sign    Days of Exercise per Week: 0 days    Minutes of Exercise per Session: 0 min  Stress: No Stress Concern Present (07/23/2022)   [place]    Feeling of Stress : Not at all  Social Connections: Socially Isolated (07/23/2022)   Social Connection and Isolation Panel [NHANES]    Frequency of Communication with Friends and Family: More than three times a week    Frequency of Social Gatherings with Friends and Family: More than three times a week    Attends Religious Services: Never    Marine scientist or Organizations: No    Attends Archivist Meetings: Never    Marital Status: Widowed    Tobacco Counseling Counseling given: Not Answered   Clinical Intake:  Pre-visit preparation completed: No  Pain : No/denies pain    Nutrition Risk Assessment:  Has the patient had any N/V/D within the last 2 months?  No  Does the patient have any non-healing wounds?  No  Has the patient had any unintentional weight loss or weight gain?  No   Diabetes:  Is the patient  diabetic?  Yes  If diabetic, was a CBG obtained today?  No  Did the patient bring in their glucometer from home?  No  How often do you monitor your CBG's? 3 x daily.   Financial Strains and Diabetes Management:  Are you having any financial strains with the device, your supplies or your medication? No .  Does the patient want to be seen by Chronic Care Management for management of their diabetes?  No  Would the patient like to be referred to a Nutritionist or for Diabetic Management?  No   Diabetic Exams:  Diabetic Eye Exam: Completed No. Overdue for diabetic eye exam. Pt has been advised about the importance in completing this exam. A referral has been placed today. Message sent to referral coordinator for scheduling purposes. Advised pt to expect a call from office referred to regarding appt.  Diabetic Foot Exam: Completed No. Pt has been advised about the importance in completing this exam. Pt is scheduled for diabetic foot exam on Followed by PCP.   BMI - recorded: 27.99 Nutritional Risks: None Diabetes: Yes CBG done?: No Did pt. bring in CBG monitor from home?: No  How often do you need to have someone help you when you read instructions, pamphlets, or other written materials from your doctor or pharmacy?: 1 - Never  Diabetic?  Yes  Interpreter Needed?: No  Information entered by :: Rolene Arbour LPN   Activities of Daily Living    07/23/2022    2:32 PM 01/22/2022    9:17 PM  In  your present state of health, do you have any difficulty performing the following activities:  Hearing? 0 0  Vision? 0 0  Difficulty concentrating or making decisions? 0 0  Walking or climbing stairs? 0 0  Dressing or bathing? 0 0  Doing errands, shopping? 0 0  Preparing Food and eating ? N   Using the Toilet? N   In the past six months, have you accidently leaked urine? N   Do you have problems with loss of bowel control? N   Managing your Medications? N   Managing your Finances? N    Housekeeping or managing your Housekeeping? N     Patient Care Team: [name], MD as PCP - General (Family Medicine) Larey Dresser, MD as PCP - Cardiology (Cardiology)  Indicate any recent Medical Services you may have received from other than [facility] providers in the past year (date may be approximate).     Assessment:   This is a routine wellness examination for [place].  Hearing/Vision screen Hearing Screening - Comments:: Denies hearing difficulties   Vision Screening - Comments:: Wears rx glasses - up to date with routine eye exams with  Dr Gershon Crane  Dietary issues and exercise activities discussed: Current Exercise Habits: The patient does not participate in regular exercise at present, Exercise limited by: None identified   Goals Addressed               This Visit's Progress     Stay Alive (pt-stated)         Depression Screen    07/23/2022    2:29 PM 05/07/2022    9:54 AM 02/19/2022   11:09 AM 07/19/2021    2:12 PM 04/28/2021    2:16 PM 07/18/2020    2:13 PM 08/20/2019    3:03 PM  PHQ 2/9 Scores  PHQ - 2 Score 0 2 0 0 0 0 0  PHQ- 9 Score  9 5   0     Fall Risk    07/23/2022    2:33 PM 05/07/2022    9:54 AM 02/19/2022   11:09 AM 07/19/2021    2:17 PM 04/28/2021    2:16 PM  Fall Risk   Falls in the past year? 1 0 0 0 0  Number falls in past yr: 0 0 0 0 0  Injury with Fall? 0 0 0 0 0  Comment Followed by medical attention      Risk for fall due to : No Fall Risks No Fall Risks No Fall Risks  No Fall Risks  Follow up Falls prevention discussed Falls evaluation completed Falls evaluation completed  Education provided;Falls prevention discussed    FALL RISK PREVENTION PERTAINING TO THE HOME:  Any stairs in or around the home? No  If so, are there any without handrails? No  Home free of loose throw rugs in walkways, pet beds, electrical cords, etc? Yes  Adequate lighting in your home to reduce risk of falls? Yes   ASSISTIVE DEVICES UTILIZED TO  PREVENT FALLS:  Life alert? No  Use of a cane, walker or w/c? No  Grab bars in the bathroom? No  Shower chair or bench in shower? No  Elevated toilet seat or a handicapped toilet? No   TIMED UP AND GO:  Was the test performed? Yes .  Length of time to ambulate 10 feet: 10 sec.   Gait steady and fast without use of assistive device  Cognitive Function:  07/23/2022    2:34 PM 07/19/2021    2:21 PM  6CIT Screen  What Year? 0 points 0 points  What month? 0 points 0 points  What time? 0 points 0 points  Count back from 20 0 points 0 points  Months in reverse 2 points 0 points  Repeat phrase 0 points 0 points  Total Score 2 points 0 points    Immunizations Immunization History  Administered Date(s) Administered   Fluad Quad(high Dose 65+) 04/23/2019, 07/18/2020, 05/07/2022   Influenza Split 07/28/2012   Influenza Whole 06/30/2010   Influenza,inj,Quad PF,6+ Mos 08/01/2013, 05/04/2015, 08/04/2016, 04/07/2018   PFIZER(Purple Top)SARS-COV-2 Vaccination 09/19/2019, 10/10/2019, 06/13/2020   Pneumococcal Polysaccharide-23 03/06/2009   Tdap 09/29/2018    TDAP status: Up to date  Flu Vaccine status: Up to date  Pneumococcal vaccine status: Due, Education has been provided regarding the importance of this vaccine. Advised may receive this vaccine at local pharmacy or Health Dept. Aware to provide a copy of the vaccination record if obtained from local pharmacy or Health Dept. Verbalized acceptance and understanding.  Covid-19 vaccine status: Completed vaccines  Qualifies for Shingles Vaccine? Yes   Zostavax completed No   Shingrix Completed?: No.    Education has been provided regarding the importance of this vaccine. Patient has been advised to call insurance company to determine out of pocket expense if they have not yet received this vaccine. Advised may also receive vaccine at local pharmacy or Health Dept. Verbalized acceptance and understanding.  Screening  Tests Health Maintenance  Topic Date Due   OPHTHALMOLOGY EXAM  10/21/2021   FOOT EXAM  05/05/2022   COVID-19 Vaccine (4 - 2023-24 season) 08/08/2022 (Originally 04/13/2022)   Zoster Vaccines- Shingrix (1 of 2) 10/22/2022 (Originally 04/02/1975)   Pneumonia Vaccine 80+ Years old (2 - PCV) 07/24/2023 (Originally 03/06/2010)   HEMOGLOBIN A1C  11/05/2022   Diabetic kidney evaluation - Urine ACR  05/08/2023   Diabetic kidney evaluation - eGFR measurement  07/19/2023   Medicare Annual Wellness (AWV)  07/24/2023   DTaP/Tdap/Td (2 - Td or Tdap) 09/29/2028   COLONOSCOPY (Pts 45-62yr Insurance coverage will need to be confirmed)  04/27/2029   INFLUENZA VACCINE  Completed   Hepatitis C Screening  Completed   HPV VACCINES  Aged Out    Health Maintenance  Health Maintenance Due  Topic Date Due   OPHTHALMOLOGY EXAM  10/21/2021   FOOT EXAM  05/05/2022    Colorectal cancer screening: Type of screening: Colonoscopy. Completed 04/28/19. Repeat every 10 years  Lung Cancer Screening: (Low Dose CT Chest recommended if Age 68-80years, 30 pack-year currently smoking OR have quit w/in 15years.) does not qualify.    Additional Screening:  Hepatitis C Screening: does qualify; Completed 06/06/15  Vision Screening: Recommended annual ophthalmology exams for early detection of glaucoma and other disorders of the eye. Is the patient up to date with their annual eye exam?  Yes  Who is the provider or what is the name of the office in which the patient attends annual eye exams? Dr SGershon CraneIf pt is not established with a provider, would they like to be referred to a provider to establish care? No .   Dental Screening: Recommended annual dental exams for proper oral hygiene  Community Resource Referral / Chronic Care Management:  CRR required this visit?  No   CCM required this visit?  No      Plan:     I have personally reviewed and noted the following in the  patient's chart:   Medical and social  history Use of alcohol, tobacco or illicit drugs  Current medications and supplements including opioid prescriptions. Patient is not currently taking opioid prescriptions. Functional ability and status Nutritional status Physical activity Advanced directives List of other physicians Hospitalizations, surgeries, and ER visits in previous 12 months Vitals Screenings to include cognitive, depression, and falls Referrals and appointments  In addition, I have reviewed and discussed with patient certain preventive protocols, quality metrics, and best practice recommendations. A written personalized care plan for preventive services as well as general preventive health recommendations were provided to patient.     Criselda Peaches, LPN   [id number]   Nurse Notes: None

## 2022-07-23 NOTE — Patient Instructions (Addendum)
Mr. Drudge , Thank you for taking time to come for your Medicare Wellness Visit. I appreciate your ongoing commitment to your health goals. Please review the following plan we discussed and let me know if I can assist you in the future.   These are the goals we discussed:  Goals       Blood Pressure < 140/90      Exercise 3x per week (30 min per time)      HEMOGLOBIN A1C < 7.0      Stay Alive (pt-stated)        This is a list of the screening recommended for you and due dates:  Health Maintenance  Topic Date Due   Eye exam for diabetics  10/21/2021   Complete foot exam   05/05/2022   COVID-19 Vaccine (4 - 2023-24 season) 08/08/2022*   Zoster (Shingles) Vaccine (1 of 2) 10/22/2022*   Pneumonia Vaccine (2 - PCV) 07/24/2023*   Hemoglobin A1C  11/05/2022   Yearly kidney health urinalysis for diabetes  05/08/2023   Yearly kidney function blood test for diabetes  07/19/2023   Medicare Annual Wellness Visit  07/24/2023   DTaP/Tdap/Td vaccine (2 - Td or Tdap) 09/29/2028   Colon Cancer Screening  04/27/2029   Flu Shot  Completed   Hepatitis C Screening: USPSTF Recommendation to screen - Ages 18-79 yo.  Completed   HPV Vaccine  Aged Out  *Topic was postponed. The date shown is not the original due date.    Advanced directives: Advance directive discussed with you today. Even though you declined this today, please call our office should you change your mind, and we can give you the proper paperwork for you to fill out.   Conditions/risks identified: None  Next appointment: Follow up in one year for your annual wellness visit     Preventive Care 68 Years and Older, Male Preventive care refers to lifestyle choices and visits with your health care provider that can promote health and wellness. What does preventive care include? A yearly physical exam. This is also called an annual well check. Dental exams once or twice a year. Routine eye exams. Ask your health care provider how  often you should have your eyes checked. Personal lifestyle choices, including: Daily care of your teeth and gums. Regular physical activity. Eating a healthy diet. Avoiding tobacco and drug use. Limiting alcohol use. Practicing safe sex. Taking low-dose aspirin every day. Taking vitamin and mineral supplements as recommended by your health care provider. What happens during an annual well check? The services and screenings done by your health care provider during your annual well check will depend on your age, overall health, lifestyle risk factors, and family history of disease. Counseling  Your health care provider may ask you questions about your: Alcohol use. Tobacco use. Drug use. Emotional well-being. Home and relationship well-being. Sexual activity. Eating habits. History of falls. Memory and ability to understand (cognition). Work and work Statistician. Reproductive health. Screening  You may have the following tests or measurements: Height, weight, and BMI. Blood pressure. Lipid and cholesterol levels. These may be checked every 5 years, or more frequently if you are over 21 years old. Skin check. Lung cancer screening. You may have this screening every year starting at age 25 if you have a 30-pack-year history of smoking and currently smoke or have quit within the past 15 years. Fecal occult blood test (FOBT) of the stool. You may have this test every year starting at age 68.  Flexible sigmoidoscopy or colonoscopy. You may have a sigmoidoscopy every 5 years or a colonoscopy every 10 years starting at age 68. Hepatitis C blood test. Hepatitis B blood test. Sexually transmitted disease (STD) testing. Diabetes screening. This is done by checking your blood sugar (glucose) after you have not eaten for a while (fasting). You may have this done every 1-3 years. Bone density scan. This is done to screen for osteoporosis. You may have this done starting at age 68. Mammogram.  This may be done every 1-2 years. Talk to your health care provider about how often you should have regular mammograms. Talk with your health care provider about your test results, treatment options, and if necessary, the need for more tests. Vaccines  Your health care provider may recommend certain vaccines, such as: Influenza vaccine. This is recommended every year. Tetanus, diphtheria, and acellular pertussis (Tdap, Td) vaccine. You may need a Td booster every 10 years. Zoster vaccine. You may need this after age 52. Pneumococcal 13-valent conjugate (PCV13) vaccine. One dose is recommended after age 26. Pneumococcal polysaccharide (PPSV23) vaccine. One dose is recommended after age 29. Talk to your health care provider about which screenings and vaccines you need and how often you need them. This information is not intended to replace advice given to you by your health care provider. Make sure you discuss any questions you have with your health care provider. Document Released: 08/26/2015 Document Revised: 04/18/2016 Document Reviewed: 05/31/2015 Elsevier Interactive Patient Education  2017 [place] Prevention in the Home Falls can cause injuries. They can happen to people of all ages. There are many things you can do to make your home safe and to help prevent falls. What can I do on the outside of my home? Regularly fix the edges of walkways and driveways and fix any cracks. Remove anything that might make you trip as you walk through a door, such as a raised step or threshold. Trim any bushes or trees on the path to your home. Use bright outdoor lighting. Clear any walking paths of anything that might make someone trip, such as rocks or tools. Regularly check to see if handrails are loose or broken. Make sure that both sides of any steps have handrails. Any raised decks and porches should have guardrails on the edges. Have any leaves, snow, or ice cleared regularly. Use sand  or salt on walking paths during winter. Clean up any spills in your garage right away. This includes oil or grease spills. What can I do in the bathroom? Use night lights. Install grab bars by the toilet and in the tub and shower. Do not use towel bars as grab bars. Use non-skid mats or decals in the tub or shower. If you need to sit down in the shower, use a plastic, non-slip stool. Keep the floor dry. Clean up any water that spills on the floor as soon as it happens. Remove soap buildup in the tub or shower regularly. Attach bath mats securely with double-sided non-slip rug tape. Do not have throw rugs and other things on the floor that can make you trip. What can I do in the bedroom? Use night lights. Make sure that you have a light by your bed that is easy to reach. Do not use any sheets or blankets that are too big for your bed. They should not hang down onto the floor. Have a firm chair that has side arms. You can use this for support while you get  dressed. Do not have throw rugs and other things on the floor that can make you trip. What can I do in the kitchen? Clean up any spills right away. Avoid walking on wet floors. Keep items that you use a lot in easy-to-reach places. If you need to reach something above you, use a strong step stool that has a grab bar. Keep electrical cords out of the way. Do not use floor polish or wax that makes floors slippery. If you must use wax, use non-skid floor wax. Do not have throw rugs and other things on the floor that can make you trip. What can I do with my stairs? Do not leave any items on the stairs. Make sure that there are handrails on both sides of the stairs and use them. Fix handrails that are broken or loose. Make sure that handrails are as long as the stairways. Check any carpeting to make sure that it is firmly attached to the stairs. Fix any carpet that is loose or worn. Avoid having throw rugs at the top or bottom of the stairs.  If you do have throw rugs, attach them to the floor with carpet tape. Make sure that you have a light switch at the top of the stairs and the bottom of the stairs. If you do not have them, ask someone to add them for you. What else can I do to help prevent falls? Wear shoes that: Do not have high heels. Have rubber bottoms. Are comfortable and fit you well. Are closed at the toe. Do not wear sandals. If you use a stepladder: Make sure that it is fully opened. Do not climb a closed stepladder. Make sure that both sides of the stepladder are locked into place. Ask someone to hold it for you, if possible. Clearly mark and make sure that you can see: Any grab bars or handrails. First and last steps. Where the edge of each step is. Use tools that help you move around (mobility aids) if they are needed. These include: Canes. Walkers. Scooters. Crutches. Turn on the lights when you go into a dark area. Replace any light bulbs as soon as they burn out. Set up your furniture so you have a clear path. Avoid moving your furniture around. If any of your floors are uneven, fix them. If there are any pets around you, be aware of where they are. Review your medicines with your doctor. Some medicines can make you feel dizzy. This can increase your chance of falling. Ask your doctor what other things that you can do to help prevent falls. This information is not intended to replace advice given to you by your health care provider. Make sure you discuss any questions you have with your health care provider. Document Released: 05/26/2009 Document Revised: 01/05/2016 Document Reviewed: 09/03/2014 Elsevier Interactive Patient Education  2017 [employer].

## 2022-07-27 DIAGNOSIS — G4733 Obstructive sleep apnea (adult) (pediatric): Secondary | ICD-10-CM | POA: Diagnosis not present

## 2022-07-27 DIAGNOSIS — E119 Type 2 diabetes mellitus without complications: Secondary | ICD-10-CM | POA: Diagnosis not present

## 2022-08-27 DIAGNOSIS — E119 Type 2 diabetes mellitus without complications: Secondary | ICD-10-CM | POA: Diagnosis not present

## 2022-09-04 ENCOUNTER — Other Ambulatory Visit (HOSPITAL_COMMUNITY): Payer: Self-pay | Admitting: Cardiology

## 2022-09-10 ENCOUNTER — Other Ambulatory Visit (INDEPENDENT_AMBULATORY_CARE_PROVIDER_SITE_OTHER): Payer: 59

## 2022-09-10 DIAGNOSIS — Z794 Long term (current) use of insulin: Secondary | ICD-10-CM

## 2022-09-10 DIAGNOSIS — E1165 Type 2 diabetes mellitus with hyperglycemia: Secondary | ICD-10-CM | POA: Diagnosis not present

## 2022-09-10 LAB — HEMOGLOBIN A1C: Hgb A1c MFr Bld: 11.8 % — ABNORMAL HIGH (ref 4.6–6.5)

## 2022-09-10 LAB — BASIC METABOLIC PANEL WITH GFR
BUN: 25 mg/dL — ABNORMAL HIGH (ref 6–23)
CO2: 25 meq/L (ref 19–32)
Calcium: 10 mg/dL (ref 8.4–10.5)
Chloride: 99 meq/L (ref 96–112)
Creatinine, Ser: 1.64 mg/dL — ABNORMAL HIGH (ref 0.40–1.50)
GFR: 42.73 mL/min — ABNORMAL LOW
Glucose, Bld: 287 mg/dL — ABNORMAL HIGH (ref 70–99)
Potassium: 3.7 meq/L (ref 3.5–5.1)
Sodium: 135 meq/L (ref 135–145)

## 2022-09-12 ENCOUNTER — Ambulatory Visit (INDEPENDENT_AMBULATORY_CARE_PROVIDER_SITE_OTHER): Payer: 59 | Admitting: Endocrinology

## 2022-09-12 ENCOUNTER — Encounter: Payer: Self-pay | Admitting: Endocrinology

## 2022-09-12 VITALS — BP 140/80 | HR 82 | Ht 71.0 in | Wt 205.0 lb

## 2022-09-12 DIAGNOSIS — N183 Chronic kidney disease, stage 3 unspecified: Secondary | ICD-10-CM

## 2022-09-12 DIAGNOSIS — E1165 Type 2 diabetes mellitus with hyperglycemia: Secondary | ICD-10-CM | POA: Diagnosis not present

## 2022-09-12 DIAGNOSIS — Z794 Long term (current) use of insulin: Secondary | ICD-10-CM

## 2022-09-12 MED ORDER — TRESIBA FLEXTOUCH 200 UNIT/ML ~~LOC~~ SOPN: PEN_INJECTOR | SUBCUTANEOUS | 1 refills | Status: DC

## 2022-09-12 NOTE — Patient Instructions (Addendum)
Humalog 14-20 at meals only  Lantus 30 units twice daily to keep am sugar 100-140

## 2022-09-12 NOTE — Progress Notes (Signed)
Patient ID: [name] [name], male   DOB: 05/19/1956, 67 y.o.   MRN: [id number]           Reason for Appointment: Type II Diabetes follow-up   History of Present Illness   Diagnosis date: 2010  Previous history:  Non-insulin hypoglycemic drugs previously used: Metformin, Jardiance Insulin was started in 2013  A1c range in the last few years is: 7-15, generally higher since 2021  Recent history:     Non-insulin hypoglycemic drugs: Jardiance 10 mg daily, metformin 1 g daily     Insulin regimen: Lantus 50 units daily, Humalog mostly 10 units daily    Side effects from medications: None  Current self management, blood sugar patterns and problems identified:  A1c is 11.8 compared to 10.5 He was started on mealtime insulin on his last visit However he is tending to forget his Lantus a couple of times a week His lab glucose was 287 fasting He forgot his freestyle libre meter again and not clear what his blood sugar patterns are However he thinks his blood sugars are the highest in the afternoon after lunch He is not sure if his sugars are higher after breakfast or dinner but appears to be taking extra 10 units of Humalog at bedtime with blood sugars are high With this his blood sugars may come down to as low as 120 in the morning otherwise they are over 200 fasting No recent weight change  Exercise: None  Diet management: may skip breakfast when not at home, not following any meal plan     Hypoglycemia:  none    Blood sugars by recall  PRE-MEAL Fasting Lunch Dinner Bedtime Overall  Glucose range: 120-200+      Mean/median:     ?   POST-MEAL PC Breakfast PC Lunch PC Dinner  Glucose range:  Up to 350 Variable  Mean/median:        Dietician visit: Most recent: None      Weight control:  Wt Readings from Last 3 Encounters:  09/12/22 205 lb (93 kg)  07/23/22 200 lb 11.2 oz (91 kg)  07/18/22 203 lb (92.1 kg)            Diabetes labs:  Lab Results   Component Value Date   HGBA1C 11.8 (H) 09/10/2022   HGBA1C 10.5 (H) 05/07/2022   HGBA1C 15.0 (H) 01/22/2022   Lab Results  Component Value Date   MICROALBUR 20.1 (H) 05/07/2022   LDLCALC 79 05/07/2022   CREATININE 1.64 (H) 09/10/2022    No results found for: "FRUCTOSAMINE"   Allergies as of 09/12/2022       Reactions   Shrimp [shellfish Allergy] Nausea And Vomiting   Ace Inhibitors Cough   Other Hives, Other (See Comments)   Patient reports developing hives after receiving "some antibiotic given in 1980''s at [facility]". He does not know which antibiotic.        Medication List        Accurate as of September 12, 2022 11:05 AM. If you have any questions, ask your nurse or doctor.          Accu-Chek Aviva Plus w/Device Kit Use as directed for 3 times daily testing of blood glucose   accu-chek softclix lancets Use as instructed for 3 times daily testing of blood glucose   acetaminophen 500 MG tablet Commonly known as: TYLENOL Take 1,000 mg by mouth every 6 (six) hours as needed (pain).   albuterol 108 (90 Base) MCG/ACT inhaler  Commonly known as: VENTOLIN HFA Inhale 1-2 puffs into the lungs every 6 (six) hours as needed for wheezing or shortness of breath.   apixaban 5 MG Tabs tablet Commonly known as: ELIQUIS Take 1 tablet (5 mg total) by mouth 2 (two) times daily.   atorvastatin 80 MG tablet Commonly known as: LIPITOR Take 1 tablet by mouth once daily   blood glucose meter kit and supplies Kit Dispense based on patient and insurance preference. Use up to four times daily as directed. (FOR ICD-9 250.00, 250.01).   carvedilol 12.5 MG tablet Commonly known as: COREG Take 1 tablet (12.5 mg total) by mouth 2 (two) times daily with a meal. What changed: how much to take   colchicine 0.6 MG tablet On day 1 take 2 tablets at first sign of gout flare.  Take 1 tablet 1 hour later.  Then take 1 tablet daily starting the next day until resolution of  gout flare.   cyclobenzaprine 10 MG tablet Commonly known as: FLEXERIL Take 1 tablet (10 mg total) by mouth 2 (two) times daily as needed for muscle spasms.   empagliflozin 25 MG Tabs tablet Commonly known as: Jardiance Take 1 tablet (25 mg total) by mouth daily before breakfast.   eplerenone 25 MG tablet Commonly known as: INSPRA Take 1 tablet by mouth once daily   FreeStyle Libre 2 Reader [facility] Check your blood sugar three times per day before meals and once before bedtime   FreeStyle Libre 2 Sensor Misc Apply one sensor to upper arm every 2 weeks   glucose blood test strip Commonly known as: Accu-Chek Aviva Plus Use as instructed for 3 times daily testing of blood glucose   hydrALAZINE 50 MG tablet Commonly known as: APRESOLINE TAKE 1 & 1/2 (ONE & ONE-HALF) TABLETS BY MOUTH TWICE DAILY .   insulin lispro 100 UNIT/ML KwikPen Commonly known as: HumaLOG KwikPen Inject 6 Units into the skin with breakfast, with lunch, and with evening meal. What changed: additional instructions   isosorbide mononitrate 120 MG 24 hr tablet Commonly known as: IMDUR Take 1 tablet by mouth once daily   Lantus SoloStar 100 UNIT/ML Solostar Pen Generic drug: insulin glargine Inject 40 Units into the skin every morning. And pen needles 1/day What changed: how much to take   metFORMIN 1000 MG tablet Commonly known as: GLUCOPHAGE Take 1,000 mg by mouth daily with breakfast.   nitroGLYCERIN 0.4 MG SL tablet Commonly known as: NITROSTAT Place 1 tablet (0.4 mg total) under the tongue every 5 (five) minutes as needed for chest pain (x 3 tabs daily).   Pen Needles 32G X 4 MM Misc 1 each by Does not apply route daily.   potassium chloride SA 20 MEQ tablet Commonly known as: KLOR-CON M Take 1 tablet (20 mEq total) by mouth daily.   sotalol 120 MG tablet Commonly known as: BETAPACE Take 1 tablet (120 mg total) by mouth 2 (two) times daily.   torsemide 20 MG tablet Commonly known as:  DEMADEX Take 80 mg by mouth 2 (two) times daily.   TRUEplus Lancets 28G Misc 1 each by Does not apply route 3 (three) times daily.   Accu-Chek Softclix Lancets lancets Use as instructed for three times daily testing of blood glucose        Allergies:  Allergies  Allergen Reactions   Shrimp [Shellfish Allergy] Nausea And Vomiting   Ace Inhibitors Cough   Other Hives and Other (See Comments)    Patient reports developing hives after  receiving "some antibiotic given in 1980''s at [facility]". He does not know which antibiotic.    Past Medical History:  Diagnosis Date   Anxiety    CAD (coronary artery disease), native coronary artery    a. Nonobstructive CAD by cath 2013 - diffuse distal and branch vessel CAD, no severe disease in the major coronaries, LV mild global hypokinesis, EF 45%. b. ETT-Sestamibi 5/14: EF 31%, small fixed inferior defect with no ischemia.   Chronic CHF ([place])    a. Mixed ICM/NICM (?EtOH). EF 35% in 2008. Echo 5/13: EF 60-65%, mod LVH, EF 45% on V gram in 12/2011. EF 12/2012: EF 50-55%, mild LVH, inferobasal HK, mild MR. ETT-Ses 5/14 EF 41%. Cardiac MRI 5/14: EF 44%, mild global HK, subepicardial delayed enhancement in nonspecific RV insertion pattern.   COLONIC POLYPS, HX OF 12/30/2006   Gout    H/O atrial flutter 08/13/2005   a. Ablations in 2007, 2008.   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   "left shoulder"   PAF (paroxysmal atrial fibrillation) ([place])    On coumadin   Sleep apnea    "suppose to send mask but they never did" (05/03/2015)   Type II diabetes mellitus ([place])     Past Surgical History:   Procedure Laterality Date   A FLUTTER ABLATION  2007, 2008   catheter ablation    CARDIAC CATHETERIZATION N/A 05/03/2015   Procedure: Right/Left Heart Cath and Coronary Angiography;  Surgeon: Larey Dresser, MD;  Location: [place] [facility];  Service: Cardiovascular;  Laterality: N/A;   CARDIAC CATHETERIZATION N/A 05/03/2015   Procedure: Coronary Stent Intervention;  Surgeon: Jettie Booze, MD;  Location: [place] [facility];  Service: Cardiovascular;  Laterality: N/A;   CARDIAC CATHETERIZATION N/A 11/04/2015   Procedure: Left Heart Cath and Coronary Angiography;  Surgeon: Larey Dresser, MD;  Location: [place] [facility];  Service: Cardiovascular;  Laterality: N/A;   CARDIOVERSION N/A 08/24/2015   Procedure: CARDIOVERSION;  Surgeon: Thayer Headings, MD;  Location: [facility] ENDOSCOPY;  Service: Cardiovascular;  Laterality: N/A;   CHOLECYSTECTOMY N/A 02/07/2016   Procedure: LAPAROSCOPIC CHOLECYSTECTOMY WITH  INTRAOPERATIVE CHOLANGIOGRAM;  Surgeon: Greer Pickerel, MD;  Location: [facility];  Service: General;  Laterality: N/A;   COLONOSCOPY WITH PROPOFOL N/A 04/28/2019   Procedure: COLONOSCOPY WITH PROPOFOL;  Surgeon: Ronnette Juniper, MD;  Location: [place];  Service: Gastroenterology;  Laterality: N/A;   CORONARY ANGIOPLASTY  12/05/01   ESOPHAGOGASTRODUODENOSCOPY (EGD) WITH PROPOFOL N/A 04/27/2019   Procedure: ESOPHAGOGASTRODUODENOSCOPY (EGD) WITH PROPOFOL;  Surgeon: Ronnette Juniper, MD;  Location: [place];  Service: Gastroenterology;  Laterality: N/A;   GIVENS CAPSULE STUDY N/A 06/29/2019   Procedure: GIVENS CAPSULE STUDY;  Surgeon: Wilford Corner, MD;  Location: [place];  Service: Endoscopy;  Laterality: N/A;   LEFT HEART CATH AND CORONARY ANGIOGRAPHY N/A 12/28/2016   Procedure: Left Heart Cath and Coronary Angiography;  Surgeon: Larey Dresser, MD;  Location: [place] [facility];  Service: Cardiovascular;  Laterality: N/A;   LEFT HEART CATHETERIZATION WITH CORONARY ANGIOGRAM N/A 12/19/2011    Procedure: LEFT HEART CATHETERIZATION WITH CORONARY ANGIOGRAM;  Surgeon:  Larey Dresser, MD;  Location[facility] CATH LAB;  Service: Cardiovascular;  Laterality: N/A;   OSTEOCHONDROMA EXCISION Left 1972   "took bone tumor off my shoulder"   POLYPECTOMY  04/28/2019   Procedure: POLYPECTOMY;  Surgeon: Ronnette Juniper, MD;  Location[facility];  Service: Gastroenterology;;   PRESSURE SENSOR/CARDIOMEMS N/A 02/01/2020   Procedure: PRESSURE SENSOR/CARDIOMEMS;  Surgeon: Larey Dresser, MD;  Location: [place] [facility];  Service: Cardiovascular;  Laterality: N/A;   RIGHT HEART CATH N/A 02/01/2020   Procedure: RIGHT HEART CATH;  Surgeon: Larey Dresser, MD;  Location: [place] [facility];  Service: Cardiovascular;  Laterality: N/A;   RIGHT/LEFT HEART CATH AND CORONARY ANGIOGRAPHY N/A 07/01/2019   Procedure: RIGHT/LEFT HEART CATH AND CORONARY ANGIOGRAPHY;  Surgeon: Larey Dresser, MD;  Location: [place] [facility];  Service: Cardiovascular;  Laterality: N/A;    Family History  Problem Relation Age of Onset   Diabetes Mother    Hypertension Mother    Heart attack Neg Hx    Stroke Neg Hx     Social History:  reports that he quit smoking about 39 years ago. His smoking use included cigarettes. He has a 72.00 pack-year smoking history. He has never used smokeless tobacco. He reports current alcohol use of about 16.0 standard drinks of alcohol per week. He reports current drug use. Drug: Marijuana.  Review of Systems:  Last diabetic eye exam date 3/22  Last urine microalbumin date: 9/23  Last foot exam date: 9/22  Symptoms of neuropathy: Some tingling in the toes  Hypertension:   ACE/ARB medication: None  BP Readings from Last 3 Encounters:  09/12/22 (!) 140/80  07/23/22 124/62  07/18/22 (!) 150/90   He has chronic kidney disease:  Lab Results  Component Value Date   CREATININE 1.64 (H) 09/10/2022   CREATININE 1.38 (H) 07/18/2022   CREATININE 1.51 (H) 06/02/2022    Lipid management:  Now appears to be on atorvastatin 80 mg    Lab Results  Component Value Date   CHOL 152 05/07/2022   CHOL 115 08/28/2021   CHOL 156 03/21/2021   Lab Results  Component Value Date   HDL 51.80 05/07/2022   HDL 45 08/28/2021   HDL 54 03/21/2021   Lab Results  Component Value Date   LDLCALC 79 05/07/2022   LDLCALC 50 08/28/2021   LDLCALC 81 03/21/2021   Lab Results  Component Value Date   TRIG 103.0 05/07/2022   TRIG 98 08/28/2021   TRIG 107 03/21/2021   Lab Results  Component Value Date   CHOLHDL 3 05/07/2022   CHOLHDL 2.6 08/28/2021   CHOLHDL 2.9 03/21/2021   Lab Results  Component Value Date   LDLDIRECT 111.0 04/22/2015   LDLDIRECT 135.0 10/20/2012     Fibrosis 4 Score = 2.14       Fib-4 interpretation is not validated for people under 39 or over 16 years of age.      Examination:   BP (!) 140/80 (BP Location: Left Arm, Patient Position: Sitting, Cuff Size: Small)   Pulse 82   Ht '5\' 11"'$  (1.803 m)   Wt 205 lb (93 kg)   SpO2 98%   BMI 28.59 kg/m   Body mass index is 28.59 kg/m.    ASSESSMENT/ PLAN:    Diabetes type 2:   Current regimen: Lantus, Jardiance 25 mg, metformin  See history of present illness for detailed discussion of current diabetes management, blood sugar patterns and problems identified  A1c is  still significantly high at 11.9 and higher  He has not taking his insulin regularly and also insufficient doses Also not understanding the need to take Humalog with every meal regardless of Premeal blood sugar With his fasting blood sugar of 287 likely has need for higher basal insulin although he has not taking Lantus every single day Blood sugars not reviewed because he did not bring his libre meter  Recommendations:  He needs more diabetes education and information on insulin adjustment Discussed that he needs to bring his libre monitor with him at every visit  Does need more consistent regimen of insulin on a daily basis both basal and  bolus For better 24 coverage we will switch the Lantus to 30 units twice daily When finished he will start with 56 units of Tresiba every day and adjusted based on morning sugars He will go to the diabetes educator in a couple of weeks to fine-tune his dosage and review his management Discussed needing to take an of mealtime insulin to keep post prandial blood sugars blood sugars preferably under 180 and to take the mealtime doses regardless of PREMEAL blood sugar level Likely will need 14 to 16 units at meals and reduce it to 10 with low-carb meals No Humalog at bedtime Follow-up in 2 months  RENAL dysfunction: Creatinine is fluctuating Discussed that his kidney function is abnormal and he needs to discuss further with his PCP, possibly needing nephrology consultation   There are no Patient Instructions on file for this visit.  Total visit time for evaluation and management and counseling = 30 minutes  [name] [name] 09/12/2022, 11:05 AM

## 2022-09-20 ENCOUNTER — Encounter (HOSPITAL_COMMUNITY): Payer: Self-pay | Admitting: *Deleted

## 2022-09-27 DIAGNOSIS — E119 Type 2 diabetes mellitus without complications: Secondary | ICD-10-CM | POA: Diagnosis not present

## 2022-09-28 ENCOUNTER — Other Ambulatory Visit: Payer: Self-pay | Admitting: Endocrinology

## 2022-09-28 ENCOUNTER — Other Ambulatory Visit (HOSPITAL_COMMUNITY): Payer: Self-pay | Admitting: Family Medicine

## 2022-10-17 ENCOUNTER — Encounter (HOSPITAL_COMMUNITY): Payer: Medicare Other | Admitting: Cardiology

## 2022-10-17 ENCOUNTER — Telehealth: Payer: Self-pay | Admitting: *Deleted

## 2022-10-17 DIAGNOSIS — I4819 Other persistent atrial fibrillation: Secondary | ICD-10-CM

## 2022-10-17 DIAGNOSIS — I4892 Unspecified atrial flutter: Secondary | ICD-10-CM

## 2022-10-17 DIAGNOSIS — N183 Chronic kidney disease, stage 3 unspecified: Secondary | ICD-10-CM

## 2022-10-17 DIAGNOSIS — I5032 Chronic diastolic (congestive) heart failure: Secondary | ICD-10-CM

## 2022-10-17 DIAGNOSIS — E119 Type 2 diabetes mellitus without complications: Secondary | ICD-10-CM

## 2022-10-17 NOTE — Telephone Encounter (Signed)
Pharmacy referral placed as requested.

## 2022-11-01 ENCOUNTER — Telehealth: Payer: Self-pay

## 2022-11-01 NOTE — Progress Notes (Signed)
Patient ID: [name] [name], male   DOB: 1955/08/31, 67 y.o.   MRN: [id number]  Care Management & Coordination Services Pharmacy Team  Reason for Encounter: Appointment Reminder  Contacted patient to confirm in office appointment with [name] [name], PharmD on 11/05/22 at 8:45. Spoke with patient on 11/02/2022   Patient's Daughter [name] [name] states she will accompany him to appointment and be sure to bring his blood pressure cuff with them.   Have you seen any other providers since your last visit? Patient reports none  Any changes in your medications or health? Patient reports none  Any side effects from any medications? Patient reports he does have an occasional cough and is not sure why.  Do you have an symptoms or problems not managed by your medications? Patient reports none  Any concerns about your health right now? Patient reports he is often winded and has shortness of breath and has to rest often.  Has your provider asked that you check blood pressure, blood sugar, or follow special diet at home? Daughter reports he has a cuff at home and checks periodically.  Do you get any type of exercise on a regular basis? Patient reports he is not using any assisted devices for mobility and is able to get around very little in the home.  Can you think of a goal you would like to reach for your health? Patient would like to have more energy and stamina and not so short of breath.  Do you have any problems getting your medications? Patient reports none  Is there anything that you would like to discuss during the appointment? Patient reports no   Chart review:  Recent office visits:  07/23/22 [name] Peaches, LPN - Patient presented for [name] [name] Annual Wellness exam. No medication changes.   05/07/22 [name] Ruddy, MD - Patient presented for Well adult exam and other concerns. Prescribed Insulin Lispro.   Recent consult visits:  09/12/22 [name] Snare, MD (Endo) - Patient presented for  Uncontrolled type 2 diabetes mellitus with hyperglycemia with long term current use of insulin and other concerns. Prescribed [name] [name]. Stopped Lantus.  07/27/22 [name] [name] - Claims encounter for type 2 diabetes mellitus without complications. No other visit details available.   07/18/22 [name] Bihari, FNP - Patient presented for Chronic diastolic heart failure and other concerns. No medication changes.   07/02/22 Patient presented to [name] [name] for [name] [name]   06/27/22 [name] [name] - Claims encounter for type 2 diabetes mellitus without complications. No other visit details available.  10/24/2/23 [name] Balding, MD - Patient presented for Pain in left wrist and other concerns. Dispensed thumb spica splint. No medication changes.  06/01/22 Patient presented to [name] [name] for [name] [name]  05/21/22 [name] Snare, MD (Endo) - Patient presented for Uncontrolled type 2 diabetes mellitus with hyperglycemia with long term current use of insulin and other concerns. Increased Jardiance.    [name] visits:  Medication Reconciliation was completed by comparing discharge summary, patient's EMR and Pharmacy list, and upon discussion with patient.  Patient presented to [name] [name] on 06/01/20 due to Fall. Patient was present for 21 hours.  New?Medications Started at [name] [name] Discharge:?? -started  cyclobenzaprine 10 MG  Medication Changes at [name] Discharge: -Changed  none  Medications Discontinued at [name] Discharge: -Stopped  none  Medications that remain the same after [name] Discharge:??  -All other medications will remain the same.  Fill History : ALBUTEROL HFA 90MCG (9GM) AER 04/06/2020 25   AMLODIPINE 10MG  TAB 09/15/2021 90   ELIQUIS 5MG          TAB 09/28/2022 30   ATORVASTATIN 80MG    TAB 07/28/2022 90   carvedilol (COREG) tablet 01/23/2022 90   COLCHICINE 0.6MG      TAB 04/24/2022 5   CYCLOBENZAPR 10MG    TAB 06/03/2022 10   FREESTYLE LIBRE 2 SENSOR KIT 07/16/2022 28   JARDIANCE 25MG  TAB 10/25/2022 30   EPLERENONE 25MG      TAB 07/11/2022 30   HYDRALAZINE 50MG     TAB 08/03/2022 84   INSULIN LISPRO PEN 100/ML INJ 05/07/2022 215   TRESIBA FLEXTOUCH 200UNIT INJ 09/12/2022 32   RELION PEN NEEDLES 32G X 4MM  32G X 4 MM MISC 03/15/2022 50   ISOSORB MONO ER 120MG  TAB 10/06/2022 30   METFORMIN 1000MG  TAB 09/29/2022 30   NITROGLYCER 0.4MG    SUB 09/07/2020 30   SOTALOL HCL 120MG    TAB 12/03/2021 90   torsemide (DEMADEX) tablet 08/26/2022 40   POT CL MICRO ER 20MEQ TAB 05/14/2022 30   Star Rating Drugs:  Atorvastatin 80 mg - Last filled 07/28/22 90 Ds at [facility] Jardiance 25 mg - Last filled 10/25/22 30 DS at [facility] Metformin 1000 mg - Last filled 09/29/22 30 DS at [place]: [name] [name] - 07/23/22 Zoster [name] - [name] [name] - [name] [name] [name] - [name] [name] [phone]

## 2022-11-03 ENCOUNTER — Other Ambulatory Visit (HOSPITAL_COMMUNITY): Payer: Self-pay | Admitting: Cardiology

## 2022-11-03 ENCOUNTER — Other Ambulatory Visit (HOSPITAL_COMMUNITY): Payer: Self-pay | Admitting: Family Medicine

## 2022-11-03 NOTE — Progress Notes (Unsigned)
Care Management & Coordination Services Pharmacy Note  11/03/2022 Name:  [name] [name] MRN:  [id number] DOB:  Jan 23, 1956  Summary: BP not at goal <130/80 A1C not at goal <7 (very uncontrolled at 11.8) LDL not at goal <70  Recommendations/Changes made from today's visit: -Check BP 2-3x weekly and keep a log, along with HR -Pt reports non-adherence to his medications due to pill fatigue and is under-dosing his insulin due to fear of lows. -Counseled on importance of adherence to medications and importance of regular meals and checking sugars 3-4x daily with his sensor and insulin use -ENROLLED in [facility] to help with adherence and med confusion  Follow up plan: DM/BP call in 1 and 2 months Pharmacist visit in 3 months   Subjective: [name] [name] is an 67 y.o. year old male who is a primary patient of [name], MD.  The care coordination team was consulted for assistance with disease management and care coordination needs.    Engaged with patient face to face for initial visit. Comes with daughter [name] [name] who calls him all the time to remind him to take his medications. Patient does admit to non-adherence to several medications due to being tired of taking so many in the morning.  Recent office visits: 07/23/22 Criselda Peaches, LPN - Patient presented for [facility] Annual Wellness exam. No medication changes.    05/07/22 [name], MD - Patient presented for Well adult exam and other concerns. Prescribed Insulin Lispro.   Recent consult visits: 09/12/22 Elayne Snare, MD (Endo) - Patient presented for Uncontrolled type 2 diabetes mellitus with hyperglycemia with long term current use of insulin and other concerns. Prescribed Tyler Aas. Stopped Lantus.   07/27/22 [facility] - Claims encounter for type 2 diabetes mellitus without complications. No other visit details available.    07/18/22 Rafael Bihari, FNP - Patient  presented for Chronic diastolic heart failure and other concerns. No medication changes.    07/02/22 Patient presented to [facility] for GI CT    06/27/22 [facility] - Claims encounter for type 2 diabetes mellitus without complications. No other visit details available.   10/24/2/23 Garald Balding, MD - Patient presented for Pain in left wrist and other concerns. Dispensed thumb spica splint. No medication changes.   06/01/22 Patient presented to [facility] for MR CARD Morphology WO/W CM   05/21/22 Elayne Snare, MD (Endo) - Patient presented for Uncontrolled type 2 diabetes mellitus with hyperglycemia with long term current use of insulin and other concerns. Increased Jardiance.       Hospital visits: 06/01/22 [facility] - For Fall - START cyclobenzaprine   Objective:  Lab Results  Component Value Date   CREATININE 1.64 (H) 09/10/2022   BUN 25 (H) 09/10/2022   GFR 42.73 (L) 09/10/2022   GFRNONAA 56 (L) 07/18/2022   GFRAA 64 07/20/2020   NA 135 09/10/2022   K 3.7 09/10/2022   CALCIUM 10.0 09/10/2022   CO2 25 09/10/2022   GLUCOSE 287 (H) 09/10/2022    Lab Results  Component Value Date/Time   HGBA1C 11.8 (H) 09/10/2022 10:46 AM   HGBA1C 10.5 (H) 05/07/2022 09:54 AM   HGBA1C 9.2 (A) 09/29/2018 10:59 AM   GFR 42.73 (L) 09/10/2022 10:46 AM   GFR 53.63 (L) 05/07/2022 09:54 AM   MICROALBUR 20.1 (H) 05/07/2022 09:54 AM   MICROALBUR 1.7 07/20/2020 02:26 PM    Last diabetic Eye exam:  Lab  Results  Component Value Date/Time   HMDIABEYEEXA No Retinopathy 10/21/2020 12:00 AM    Last diabetic Foot exam: No results found for: "HMDIABFOOTEX"   Lab Results  Component Value Date   CHOL 152 05/07/2022   HDL 51.80 05/07/2022   LDLCALC 79 05/07/2022   LDLDIRECT 111.0 04/22/2015   TRIG 103.0 05/07/2022   CHOLHDL 3 05/07/2022       Latest Ref Rng & Units 06/02/2022   12:15 AM 05/07/2022    9:54 AM 02/19/2022   11:22  AM  Hepatic Function  Total Protein 6.5 - 8.1 g/dL 7.7  7.3  7.4   Albumin 3.5 - 5.0 g/dL 3.6  3.9  3.9   AST 15 - 41 U/L 38  14  14   ALT 0 - 44 U/L 23  15  12    Alk Phosphatase 38 - 126 U/L 70  72  55   Total Bilirubin 0.3 - 1.2 mg/dL 0.5  0.7  0.7     Lab Results  Component Value Date/Time   TSH 1.22 05/07/2022 09:54 AM   TSH 0.86 12/12/2020 02:36 PM   FREET4 0.89 05/07/2022 09:54 AM   FREET4 0.93 12/12/2020 02:36 PM       Latest Ref Rng & Units 06/02/2022   12:15 AM 05/07/2022    9:54 AM 02/19/2022   11:22 AM  CBC  WBC 4.0 - 10.5 K/uL 6.7  5.4  5.6   Hemoglobin 13.0 - 17.0 g/dL 18.1  16.6  15.8   Hematocrit 39.0 - 52.0 % 54.9  50.8  48.4   Platelets 150 - 400 K/uL 134  115.0  150.0     No results found for: "VD25OH", "VITAMINB12"  Clinical ASCVD: Yes  The 10-year ASCVD risk score (Arnett DK, et al., 2019) is: 33.2%   Values used to calculate the score:     Age: 23 years     Sex: Male     Is Non-Hispanic African American: Yes     Diabetic: Yes     Tobacco smoker: No     Systolic Blood Pressure: [id number] mmHg     Is BP treated: Yes     HDL Cholesterol: 51.8 mg/dL     Total Cholesterol: 152 mg/dL       07/23/2022    2:29 PM 05/07/2022    9:54 AM 02/19/2022   11:09 AM  Depression screen PHQ 2/9  Decreased Interest 0 1 0  Down, Depressed, Hopeless 0 1 0  PHQ - 2 Score 0 2 0  Altered sleeping  1 1  Tired, decreased energy  2 1  Change in appetite  2 2  Feeling bad or failure about yourself   2 1  Trouble concentrating  0 0  Moving slowly or fidgety/restless  0 0  Suicidal thoughts  0 0  PHQ-9 Score  9 5  Difficult doing work/chores  Not difficult at all Not difficult at all     Social History   Tobacco Use  Smoking Status Former   Packs/day: 4.00   Years: 18.00   Additional pack years: 0.00   Total pack years: 72.00   Types: Cigarettes   Quit date: 08/14/1983   Years since quitting: 39.2  Smokeless Tobacco Never   BP Readings from Last 3 Encounters:   09/12/22 (!) 140/80  07/23/22 124/62  07/18/22 (!) 150/90   Pulse Readings from Last 3 Encounters:  09/12/22 82  07/23/22 62  07/18/22 75   Wt Readings from Last  3 Encounters:  09/12/22 205 lb (93 kg)  07/23/22 200 lb 11.2 oz (91 kg)  07/18/22 203 lb (92.1 kg)   BMI Readings from Last 3 Encounters:  09/12/22 28.59 kg/m  07/23/22 27.99 kg/m  07/18/22 28.31 kg/m    Allergies  Allergen Reactions   Shrimp [Shellfish Allergy] Nausea And Vomiting   Ace Inhibitors Cough   Other Hives and Other (See Comments)    Patient reports developing hives after receiving "some antibiotic given in 1980''s at [facility]". He does not know which antibiotic.    Medications Reviewed Today     Reviewed by Jefferson Fuel, CMA (Certified Medical Assistant) on 09/12/22 at 1049  Med List Status: <None>   Medication Order Taking? Sig Documenting Provider Last Dose Status Informant  ACCU-CHEK SOFTCLIX LANCETS lancets [id number] Yes Use as instructed for three times daily testing of blood glucose Tresa Garter, MD Taking Active Self, Pharmacy Records  acetaminophen (TYLENOL) 500 MG tablet [id number] Yes Take 1,000 mg by mouth every 6 (six) hours as needed (pain). [provider] Taking Active Self, Pharmacy Records  albuterol (VENTOLIN HFA) 108 (90 Base) MCG/ACT inhaler [id number] Yes Inhale 1-2 puffs into the lungs every 6 (six) hours as needed for wheezing or shortness of breath. Ok Edwards, PA-C Taking Active Self, Pharmacy Records  apixaban (ELIQUIS) 5 MG TABS tablet [id number] Yes Take 1 tablet (5 mg total) by mouth 2 (two) times daily. [place], Maricela Bo, FNP Taking Active Self, Pharmacy Records  atorvastatin (LIPITOR) 80 MG tablet [id number] Yes Take 1 tablet by mouth once daily [place], Maricela Bo, FNP Taking Active Self, Pharmacy Records  blood glucose meter kit and supplies KIT [id number] Yes Dispense based on patient and insurance preference. Use up to four times daily  as directed. (FOR ICD-9 250.00, 250.01). [name], MD Taking Active Self, Pharmacy Records  Blood Glucose Monitoring Suppl (ACCU-CHEK AVIVA PLUS) w/Device KIT [id number] Yes Use as directed for 3 times daily testing of blood glucose Tresa Garter, MD Taking Active Self, Pharmacy Records  carvedilol (COREG) 12.5 MG tablet [id number]  Take 1 tablet (12.5 mg total) by mouth 2 (two) times daily with a meal.  Patient taking differently: Take 6.125 mg by mouth 2 (two) times daily with a meal.   Mariel Aloe, MD  Expired 07/18/22 2359            Med Note (ADSIDE, Tiney Rouge   Wed Feb 07, 2022  8:42 AM)    colchicine 0.6 MG tablet [id number] Yes On day 1 take 2 tablets at first sign of gout flare.  Take 1 tablet 1 hour later.  Then take 1 tablet daily starting the next day until resolution of gout flare. [place], Maricela Bo, FNP Taking Active Self, Pharmacy Records  Continuous Blood Gluc Receiver (FREESTYLE LIBRE 2 READER) [place]VI [id number] Yes Check your blood sugar three times per day before meals and once before bedtime [name], MD Taking Active   Continuous Blood Gluc Sensor (FREESTYLE LIBRE 2 SENSOR) [place] [id number] Yes Apply one sensor to upper arm every 2 weeks Elayne Snare, MD Taking Active   cyclobenzaprine (FLEXERIL) 10 MG tablet [id number] Yes Take 1 tablet (10 mg total) by mouth 2 (two) times daily as needed for muscle spasms. Nogle, [place], MD Taking Active   empagliflozin (JARDIANCE) 25 MG TABS tablet [id number] Yes Take 1 tablet (25 mg total) by mouth daily before breakfast. Elayne Snare, MD Taking Active   eplerenone (INSPRA) 25 MG  tablet [id number] Yes Take 1 tablet by mouth once daily Larey Dresser, MD Taking Active   glucose blood (ACCU-CHEK AVIVA PLUS) test strip [id number] Yes Use as instructed for 3 times daily testing of blood glucose Tresa Garter, MD Taking Active Self, Pharmacy Records  hydrALAZINE (APRESOLINE) 50 MG tablet [id number] Yes TAKE 1 & 1/2 (ONE &  ONE-HALF) TABLETS BY MOUTH TWICE DAILY . Larey Dresser, MD Taking Active   insulin glargine (LANTUS SOLOSTAR) 100 UNIT/ML Solostar Pen [id number] Yes Inject 40 Units into the skin every morning. And pen needles 1/day  Patient taking differently: Inject 50 Units into the skin every morning. And pen needles 1/day   Elayne Snare, MD Taking Active   insulin lispro (HUMALOG KWIKPEN) 100 UNIT/ML KwikPen [id number] Yes Inject 6 Units into the skin with breakfast, with lunch, and with evening meal.  Patient taking differently: Inject 6 Units into the skin with breakfast, with lunch, and with evening meal. As needed   [name], MD Taking Active   Insulin Pen Needle (PEN NEEDLES) 32G X 4 MM MISC [id number] Yes 1 each by Does not apply route daily. Elayne Snare, MD Taking Active   isosorbide mononitrate (IMDUR) 120 MG 24 hr tablet [id number] Yes Take 1 tablet by mouth once daily [place], Maricela Bo, FNP Taking Active   Lancet Devices (ACCU-CHEK [place]) lancets [id number] Yes Use as instructed for 3 times daily testing of blood glucose Tresa Garter, MD Taking Active Self, Pharmacy Records  metFORMIN (GLUCOPHAGE) 1000 MG tablet [id number] Yes Take 1,000 mg by mouth daily with breakfast. Elayne Snare, MD Taking Active Self, Pharmacy Records  nitroGLYCERIN (NITROSTAT) 0.4 MG SL tablet [id number] Yes Place 1 tablet (0.4 mg total) under the tongue every 5 (five) minutes as needed for chest pain (x 3 tabs daily). [place], Maricela Bo, FNP Taking Active Self, Pharmacy Records  potassium chloride SA (KLOR-CON M) 20 MEQ tablet [id number] Yes Take 1 tablet (20 mEq total) by mouth daily. Larey Dresser, MD Taking Active   sotalol (BETAPACE) 120 MG tablet [id number] Yes Take 1 tablet (120 mg total) by mouth 2 (two) times daily. [place], Maricela Bo, FNP Taking Active Self, Pharmacy Records  torsemide [facility]) 20 MG tablet [id number] Yes Take 80 mg by mouth 2 (two) times daily. [provider] Taking Active  Self, Pharmacy Records  TRUEPLUS LANCETS [place] [id number] Yes 1 each by Does not apply route 3 (three) times daily. Tresa Garter, MD Taking Active Self, Pharmacy Records            SDOH:  (Social Determinants of Health) assessments and interventions performed: Yes SDOH Interventions    Flowsheet Row Clinical Support from 07/23/2022 in [place] at [facility] CHF from 12/11/2021 in [place] and [facility] [place] [facility] from 04/28/2021 in [place] at [facility] from 07/18/2020 in [place] at [place] Interventions Intervention Not Indicated Intervention Not Indicated Intervention Not Indicated Intervention Not Indicated  Housing Interventions Intervention Not Indicated Intervention Not Indicated Other (Comment)  [referred to care guide] Intervention Not Indicated  Transportation Interventions Intervention Not Indicated Intervention Not Indicated Intervention Not Indicated Intervention Not Indicated  Utilities Interventions Intervention Not Indicated -- -- --  Alcohol Usage Interventions Intervention Not Indicated (Score <7) -- -- --  Depression Interventions/Treatment  -- -- -- [id number] Score <4 Follow-up Not Indicated  Financial Strain Interventions  Intervention Not Indicated -- -- Intervention Not Indicated  Physical Activity Interventions Intervention Not Indicated -- -- Intervention Not Indicated  Stress Interventions Intervention Not Indicated -- Intervention Not Indicated Intervention Not Indicated  Social Connections Interventions Intervention Not Indicated -- -- Intervention Not Indicated       Medication Assistance: None required.  Patient affirms current coverage meets needs.  Medication Access: Within the past 30 days, how often has patient missed a dose of medication? Several, intentionally or  due to forgetting Is a pillbox or other method used to improve adherence? No Factors that may affect medication adherence? Side effects (urination w/ fluid pill), pill fatigue Are meds synced by current pharmacy? No  Are meds delivered by current pharmacy? No  Does patient experience delays in picking up medications due to transportation concerns? No   [facility] Reviewed: Is patient disadvantaged to use [facility]?: No  Current Rx insurance plan: [facility] Name and location of Current pharmacy:  [facility] ([place]), [place] [id number] [place] [address] Phone: [phone] Fax: [phone]  [id number] [place], [place] [id number] [place] [id number] Phone: [phone] Fax: [phone]  [employer], [place] [address] Phone: [phone] Fax: [phone]  [facility] services reviewed with patient today?: Yes  Patient requests to transfer care to [facility]?: Yes    Compliance/Adherence/Medication fill history: Care Gaps: AWV - 07/23/22 Zoster Vaccine - Overdue COVID Booster - Overdue PNA Vaccine - Overdue  Star-Rating Drugs: Atorvastatin 80mg  PDC 94% Jardiance 25mg  PDC 60% Metformin 1000mg  PDC 24%   Assessment/Plan   Hypertension (BP goal <130/80) -Uncontrolled -Current treatment: Hydralazine 50mg  1 and 1/2 tabs BID Appropriate, Query Effective See HF section for other medications that may affect BP -Medications previously tried: Amlodipine, Candesartan, Losartan -Current home readings: does not check at home but does have a device that was brought in today. Reads 137/67 HR 53 (accuracy with office machine was confirmed) -Current dietary habits: eats poorly, not mindful of salt, doesn't eat 3 meals a day due to nausea, smokes  marijuana -Current exercise habits: Not discussed -Denies hypotensive/hypertensive symptoms -Educated on BP goals and benefits of medications for prevention of heart attack, stroke and kidney damage; Daily salt intake goal < 2300 mg; Exercise goal of 150 minutes per week; Importance of home blood pressure monitoring; Proper BP monitoring technique; -Counseled to monitor BP at home 2-3x weekly, document, and provide log at future appointments -Recommended to continue current medication  Hyperlipidemia: (LDL goal < 70) -Uncontrolled -Current treatment: Atorvastatin 80mg  -Medications previously tried: Crestor, Simvastatin, Lovastatin  -Current dietary patterns: see above -Current exercise habits: see above -Educated on Cholesterol goals;  Benefits of statin for ASCVD risk reduction; Importance of limiting foods high in cholesterol; Exercise goal of 150 minutes per week; -Recommended to continue current medication, importance of adherence  Diabetes (A1c goal <7%) -Uncontrolled -Current medications: Jardiance 25mg  1 qd Appropriate, Effective, Safe, Accessible Tresiba 200 units Inject 56 units daily ---taking Lantus 50 units daily -- will notify Dr. Dwyane Dee Appropriate, Query Effective Humalog Inject 6 units TID with meals ----not giving 3 times a day if he skips meals Metformin 1000mg  1 tab daily with breakfast Appropriate, Effective, Safe, Accessible -Medications previously tried: Lantus, Novolog  -Current home glucose readings Has Freestyle, did not bring his reader with him to  show readings -Denies hypoglycemic/hyperglycemic symptoms -Current meal patterns:  Irregular meal patterns, reports no appetite and nausea Will sometimes replace meal with Glucerna drinks: juice, milk , little water, beer (6-12 pack per week, with liquor) -Current exercise: see above -Patient brings in Lantus and no Tyler Aas (was switched on 09/12/22 per Dr. Ronnie Derby note) -Educated on A1c and blood sugar  goals; Complications of diabetes including kidney damage, retinal damage, and cardiovascular disease; Prevention and management of hypoglycemic episodes; Benefits of routine self-monitoring of blood sugar; -Counseled to check feet daily and get yearly eye exams -Not a candidate to increase metformin due to GFR <45 -Recommended to continue current medication -Unable to assess home control with no readings   Heart Failure (Goal: manage symptoms and prevent exacerbations) -Not ideally controlled -Last ejection fraction: 55-60% (Date: 01/23/22) -HF type: Diastolic, HFpEF -NYHA Class: III (marked limitation of activity) -AHA HF Stage: C (Heart disease and symptoms present) -Current treatment: Carvedilol 12.5mg  BID --brings in Carvedilol 25mg , taking 1/2 tab daily Appropriate, Effective, Safe, Accessible Jardiance 25mg  1 qd Appropriate, Effective, Safe, Accessible Eplerenone 25mg  1 qd Appropriate, Effective, Safe, Accessible Potassium SA 20 mEq 1 qd --- taking differently 1/2 tab daily Appropriate, Query Effective Torsemide 20mg  4 tabs in the morning, 2 in the evening Appropriate, Effective, Safe, Accessible ---admits to nonadherence due to urination issues -Medications previously tried: Lasix, Metolazone, Spironolactone -Current home BP/HR readings: see above -Current home daily weights: not checking -Current dietary habits: not mindful of salt -Current exercise habits: not discussed -Educated on Benefits of medications for managing symptoms and prolonging life Importance of weighing daily; if you gain more than 3 pounds in one day or 5 pounds in one week, notify cardiologist and me Proper diuretic administration and potassium supplementation -Counseled on diet and exercise extensively Recommended to continue current medication  Atrial Fibrillation (Goal: prevent stroke and major bleeding) -Not assessed today -CHADSVASC: 5 -Current treatment: Rate control: Sotalol 120mg  BID  Appropriate, Effective, Safe, Accessible Anticoagulation: Eliquis 5mg  BID Appropriate, Effective, Safe, Accessible  CAD (Goal: Slow progression of atherosclerosis (plaques / blockages) throughout your body to reduce risk of heart attack and strokes) -Not assessed today Current Medication Therapy:  Isosorbide Mono 120mg  1 qd Appropriate, Effective, Safe, Accessible  Nitroglycerin 0.4mg  SL prn Appropriate, Effective, Safe, Accessible Medications Previously Tried: Ranexa   Chronic Kidney Disease Stage 3b GFR 42  -All medications assessed for renal dosing and appropriateness in chronic kidney disease.   Gout (Goal: Prevent gout flares) -Not assessed today -Current treatment  Colchicine 0.6mg  prn Appropriate, Effective, Safe, Accessible   Maren Reamer Clinical Pharmacist [phone]

## 2022-11-05 ENCOUNTER — Ambulatory Visit: Payer: 59

## 2022-11-06 ENCOUNTER — Other Ambulatory Visit (HOSPITAL_COMMUNITY): Payer: Self-pay | Admitting: Cardiology

## 2022-11-06 ENCOUNTER — Telehealth: Payer: Self-pay

## 2022-11-06 ENCOUNTER — Other Ambulatory Visit: Payer: Self-pay

## 2022-11-06 DIAGNOSIS — I5032 Chronic diastolic (congestive) heart failure: Secondary | ICD-10-CM

## 2022-11-06 DIAGNOSIS — E785 Hyperlipidemia, unspecified: Secondary | ICD-10-CM

## 2022-11-06 DIAGNOSIS — I2510 Atherosclerotic heart disease of native coronary artery without angina pectoris: Secondary | ICD-10-CM

## 2022-11-06 DIAGNOSIS — E1165 Type 2 diabetes mellitus with hyperglycemia: Secondary | ICD-10-CM

## 2022-11-06 DIAGNOSIS — I4819 Other persistent atrial fibrillation: Secondary | ICD-10-CM

## 2022-11-06 MED ORDER — INSULIN LISPRO (1 UNIT DIAL) 100 UNIT/ML (KWIKPEN): 6.0000 [IU] | PEN_INJECTOR | Freq: Three times a day (TID) | SUBCUTANEOUS | 2 refills | Status: DC

## 2022-11-06 MED ORDER — POTASSIUM CHLORIDE CRYS ER 20 MEQ PO TBCR: 20.0000 meq | EXTENDED_RELEASE_TABLET | Freq: Every day | ORAL | 3 refills | Status: DC

## 2022-11-06 MED ORDER — NITROGLYCERIN 0.4 MG SL SUBL: 0.4000 mg | SUBLINGUAL_TABLET | SUBLINGUAL | 1 refills | Status: DC | PRN

## 2022-11-06 MED ORDER — CARVEDILOL 12.5 MG PO TABS: 12.5000 mg | ORAL_TABLET | Freq: Two times a day (BID) | ORAL | 3 refills | Status: DC

## 2022-11-06 MED ORDER — EPLERENONE 25 MG PO TABS: 25.0000 mg | ORAL_TABLET | Freq: Every day | ORAL | 3 refills | Status: DC

## 2022-11-06 MED ORDER — SOTALOL HCL 120 MG PO TABS: 120.0000 mg | ORAL_TABLET | Freq: Two times a day (BID) | ORAL | 3 refills | Status: DC

## 2022-11-06 MED ORDER — METFORMIN HCL 1000 MG PO TABS: ORAL_TABLET | ORAL | 2 refills | Status: DC

## 2022-11-06 MED ORDER — APIXABAN 5 MG PO TABS: 5.0000 mg | ORAL_TABLET | Freq: Two times a day (BID) | ORAL | 3 refills | Status: DC

## 2022-11-06 MED ORDER — ATORVASTATIN CALCIUM 80 MG PO TABS: 80.0000 mg | ORAL_TABLET | Freq: Every day | ORAL | 3 refills | Status: DC

## 2022-11-06 MED ORDER — FREESTYLE LIBRE 2 SENSOR MISC: 2 refills | Status: DC

## 2022-11-06 MED ORDER — HYDRALAZINE HCL 50 MG PO TABS: 75.0000 mg | ORAL_TABLET | Freq: Two times a day (BID) | ORAL | 3 refills | Status: DC

## 2022-11-06 MED ORDER — ISOSORBIDE MONONITRATE ER 120 MG PO TB24: 120.0000 mg | ORAL_TABLET | Freq: Every day | ORAL | 3 refills | Status: DC

## 2022-11-06 MED ORDER — TORSEMIDE 20 MG PO TABS: ORAL_TABLET | ORAL | 3 refills | Status: DC

## 2022-11-06 NOTE — Progress Notes (Signed)
Patient ID: [name] [name], male   DOB: Dec 22, 1955, 67 y.o.   MRN: [id number]   Care Management & Coordination Services Pharmacy Team  Reason for Encounter: General adherence update   Contacted prescribing offices as follows to request prescriptions be sent to [facility].    Allena Katz [place],  [phone]  ATORVASTATIN 80MG    TAB  CARVEDILOL 25MG      TAB  ELIQUIS 5MG          TAB  ISOSORB MONO ER 120MG  TAB  NITROGLYCER 0.4MG    SUB  SOTALOL HCL 120MG    TAB  torsemide (DEMADEX) tablet  Larey Dresser, MD [phone] EPLERENONE 25MG      TAB   HYDRALAZINE 50MG     TAB  POT CL MICRO ER 20MEQ TAB   Elayne Snare, MD [phone]  FREESTYLE LIBRE 2 SENSOR KIT  LANTUS SOLOSTAR PEN INJ 50 u TRESIBA FLEXTOUCH 200UNIT INJ  JARDIANCE 25MG  TAB  Humalog Sliding Scale METFORMIN 1000MG  TAB     Medications: Outpatient Encounter Medications as of 11/06/2022  Medication Sig Note   ACCU-CHEK SOFTCLIX LANCETS lancets Use as instructed for three times daily testing of blood glucose    acetaminophen (TYLENOL) 500 MG tablet Take 1,000 mg by mouth every 6 (six) hours as needed (pain).    albuterol (VENTOLIN HFA) 108 (90 Base) MCG/ACT inhaler Inhale 1-2 puffs into the lungs every 6 (six) hours as needed for wheezing or shortness of breath.    atorvastatin (LIPITOR) 80 MG tablet Take 1 tablet by mouth once daily    blood glucose meter kit and supplies KIT Dispense based on patient and insurance preference. Use up to four times daily as directed. (FOR ICD-9 250.00, 250.01).    Blood Glucose Monitoring Suppl (ACCU-CHEK AVIVA PLUS) w/Device KIT Use as directed for 3 times daily testing of blood glucose    carvedilol (COREG) 12.5 MG tablet Take 1 tablet (12.5 mg total) by mouth 2 (two) times daily with a meal. (Patient taking differently: Take 6.125 mg by mouth 2 (two) times daily with a meal.)    colchicine 0.6 MG tablet On day 1 take 2 tablets at first sign of gout flare.  Take 1  tablet 1 hour later.  Then take 1 tablet daily starting the next day until resolution of gout flare.    Continuous Blood Gluc Receiver (FREESTYLE LIBRE 2 READER) DEVI Check your blood sugar three times per day before meals and once before bedtime    Continuous Blood Gluc Sensor (FREESTYLE LIBRE 2 SENSOR) MISC Apply one sensor to upper arm every 2 weeks    ELIQUIS 5 MG TABS tablet Take 1 tablet by mouth twice daily    empagliflozin (JARDIANCE) 25 MG TABS tablet Take 1 tablet (25 mg total) by mouth daily before breakfast.    eplerenone (INSPRA) 25 MG tablet Take 1 tablet by mouth once daily    glucose blood (ACCU-CHEK AVIVA PLUS) test strip Use as instructed for 3 times daily testing of blood glucose    hydrALAZINE (APRESOLINE) 50 MG tablet TAKE 1 & 1/2 (ONE & ONE-HALF) TABLETS BY MOUTH TWICE DAILY .    insulin degludec (TRESIBA FLEXTOUCH) 200 UNIT/ML FlexTouch Pen 56 units daily 11/05/2022: Pt brings in lantus pens, taking 50 units daily   insulin lispro (HUMALOG KWIKPEN) 100 UNIT/ML KwikPen Inject 6 Units into the skin with breakfast, with lunch, and with evening meal. (Patient taking differently: Inject 6 Units into the skin with breakfast, with lunch, and with evening meal. As needed)  Insulin Pen Needle (PEN NEEDLES) 32G X 4 MM MISC 1 each by Does not apply route daily.    isosorbide mononitrate (IMDUR) 120 MG 24 hr tablet Take 1 tablet by mouth once daily    Lancet Devices (ACCU-CHEK SOFTCLIX) lancets Use as instructed for 3 times daily testing of blood glucose    metFORMIN (GLUCOPHAGE) 1000 MG tablet TAKE 1 TABLET BY MOUTH ONCE DAILY WITH BREAKFAST . APPOINTMENT REQUIRED FOR FUTURE REFILLS    nitroGLYCERIN (NITROSTAT) 0.4 MG SL tablet Place 1 tablet (0.4 mg total) under the tongue every 5 (five) minutes as needed for chest pain (x 3 tabs daily).    potassium chloride SA (KLOR-CON M) 20 MEQ tablet Take 1 tablet (20 mEq total) by mouth daily. 11/05/2022: Taking 1/2 tab daily   sotalol (BETAPACE)  120 MG tablet Take 1 tablet (120 mg total) by mouth 2 (two) times daily.    torsemide (DEMADEX) 20 MG tablet TAKE 4 TABLETS BY MOUTH IN THE MORNING AND 2 IN THE EVENING    TRUEPLUS LANCETS 28G MISC 1 each by Does not apply route 3 (three) times daily.    No facility-administered encounter medications on file as of 11/06/2022.    Recent vitals BP Readings from Last 3 Encounters:  09/12/22 (!) 140/80  07/23/22 124/62  07/18/22 (!) 150/90   Pulse Readings from Last 3 Encounters:  09/12/22 82  07/23/22 62  07/18/22 75   Wt Readings from Last 3 Encounters:  09/12/22 205 lb (93 kg)  07/23/22 200 lb 11.2 oz (91 kg)  07/18/22 203 lb (92.1 kg)   BMI Readings from Last 3 Encounters:  09/12/22 28.59 kg/m  07/23/22 27.99 kg/m  07/18/22 28.31 kg/m    Recent lab results    Component Value Date/Time   NA 135 09/10/2022 1046   NA 140 04/08/2018 1102   K 3.7 09/10/2022 1046   CL 99 09/10/2022 1046   CO2 25 09/10/2022 1046   GLUCOSE 287 (H) 09/10/2022 1046   BUN 25 (H) 09/10/2022 1046   BUN 9 04/08/2018 1102   CREATININE 1.64 (H) 09/10/2022 1046   CREATININE 1.34 (H) 07/20/2020 1416   CALCIUM 10.0 09/10/2022 1046    Lab Results  Component Value Date   CREATININE 1.64 (H) 09/10/2022   GFR 42.73 (L) 09/10/2022   GFRNONAA 56 (L) 07/18/2022   GFRAA 64 07/20/2020   Lab Results  Component Value Date/Time   HGBA1C 11.8 (H) 09/10/2022 10:46 AM   HGBA1C 10.5 (H) 05/07/2022 09:54 AM   HGBA1C 9.2 (A) 09/29/2018 10:59 AM   MICROALBUR 20.1 (H) 05/07/2022 09:54 AM   MICROALBUR 1.7 07/20/2020 02:26 PM    Lab Results  Component Value Date   CHOL 152 05/07/2022   HDL 51.80 05/07/2022   LDLCALC 79 05/07/2022   LDLDIRECT 111.0 04/22/2015   TRIG 103.0 05/07/2022   CHOLHDL 3 05/07/2022      [place] Pharmacist Assistant [phone]

## 2022-11-06 NOTE — Telephone Encounter (Signed)
Pharmacy called to request updated scripts for pill packs [facility]

## 2022-11-12 ENCOUNTER — Other Ambulatory Visit: Payer: 59

## 2022-11-14 ENCOUNTER — Encounter: Payer: Self-pay | Admitting: Endocrinology

## 2022-11-14 ENCOUNTER — Telehealth: Payer: Self-pay | Admitting: Family Medicine

## 2022-11-14 ENCOUNTER — Ambulatory Visit (INDEPENDENT_AMBULATORY_CARE_PROVIDER_SITE_OTHER): Payer: 59 | Admitting: Endocrinology

## 2022-11-14 VITALS — BP 120/74 | HR 53 | Ht 71.0 in | Wt 200.4 lb

## 2022-11-14 DIAGNOSIS — N183 Chronic kidney disease, stage 3 unspecified: Secondary | ICD-10-CM | POA: Diagnosis not present

## 2022-11-14 DIAGNOSIS — Z794 Long term (current) use of insulin: Secondary | ICD-10-CM | POA: Diagnosis not present

## 2022-11-14 DIAGNOSIS — E1165 Type 2 diabetes mellitus with hyperglycemia: Secondary | ICD-10-CM

## 2022-11-14 LAB — POCT GLYCOSYLATED HEMOGLOBIN (HGB A1C): Hemoglobin A1C: 10.7 % — AB (ref 4.0–5.6)

## 2022-11-14 MED ORDER — TRESIBA FLEXTOUCH 200 UNIT/ML ~~LOC~~ SOPN: PEN_INJECTOR | SUBCUTANEOUS | 1 refills | Status: DC

## 2022-11-14 NOTE — Telephone Encounter (Signed)
Patient advised since the "Foot problem" is new he will need to come in for evaluation prior to referral. He was scheduled to come in 11/16/22

## 2022-11-14 NOTE — Patient Instructions (Addendum)
Tresiba 54 units a day at noon  Humalog 20 units at each meal and 25 for starcy meals  Check sugar at least 3x daily  Less juice and beer

## 2022-11-14 NOTE — Telephone Encounter (Signed)
Patient requesting a referral to see Dr Gershon Crane and have his eyes checked. Also seeking a referral to a podiatrist, has never been to one and said he doesn't know who to call.

## 2022-11-14 NOTE — Progress Notes (Signed)
Patient ID: [name] [name], male   DOB: [birth year], 67 y.o.   MRN: [id number]           Reason for Appointment: Type II Diabetes follow-up   History of Present Illness   Diagnosis date: 2010  Previous history:  Non-insulin hypoglycemic drugs previously used: Metformin, Jardiance Insulin was started in 2013  A1c range in the last few years is: 7-15, generally higher since 2021  Recent history:     Non-insulin hypoglycemic drugs: Jardiance 10 mg daily, metformin 1 g daily     Insulin regimen: Lantus 50 units q od, Humalog 10 units at meals    Side effects from medications: None  Current self management, blood sugar patterns and problems identified:  A1c is 10.7 and persistently over 10% He was told to switch from Lantus to [place] but he still takes Lantus, not clear if he picked up his [place] or not in January He now says that he takes his Lantus only every other day despite reminders does not remember to take it daily With his high blood sugars which are averaging around 235 he tends to stay thirsty He is drinking relatively large amounts of milk, juice and also beer when he gets thirsty Blood sugar data is available only sporadically on 3 or 4 days on his sensor over the last 2 weeks Blood sugars are generally averaging in the 230 range, not enough data available to identify postprandial patterns but occasionally may have some spikes at different times He has variable mealtimes and may eat 1 or 2 meals a day He has lost weight  Exercise: None  Diet management: may skip breakfast when not at home, not following any meal plan     Hypoglycemia:  none    Blood sugars as above    Dietician visit: Most recent: None      Weight control:  Wt Readings from Last 3 Encounters:  11/14/22 200 lb 6.4 oz (90.9 kg)  09/12/22 205 lb (93 kg)  07/23/22 200 lb 11.2 oz (91 kg)            Diabetes labs:  Lab Results  Component Value Date   HGBA1C 10.7 (A) 11/14/2022    HGBA1C 11.8 (H) 09/10/2022   HGBA1C 10.5 (H) 05/07/2022   Lab Results  Component Value Date   MICROALBUR 20.1 (H) 05/07/2022   LDLCALC 79 05/07/2022   CREATININE 1.64 (H) 09/10/2022    No results found for: "FRUCTOSAMINE"   Allergies as of 11/14/2022       Reactions   Shrimp [shellfish Allergy] Nausea And Vomiting   Ace Inhibitors Cough   Other Hives, Other (See Comments)   Patient reports developing hives after receiving "some antibiotic given in 1980''s at [facility]". He does not know which antibiotic.        Medication List        Accurate as of November 14, 2022  3:10 PM. If you have any questions, ask your nurse or doctor.          Accu-Chek Aviva Plus w/Device Kit Use as directed for 3 times daily testing of blood glucose   accu-chek softclix lancets Use as instructed for 3 times daily testing of blood glucose   acetaminophen 500 MG tablet Commonly known as: TYLENOL Take 1,000 mg by mouth every 6 (six) hours as needed (pain).   albuterol 108 (90 Base) MCG/ACT inhaler Commonly known as: VENTOLIN HFA Inhale 1-2 puffs into the lungs every 6 (six) hours  as needed for wheezing or shortness of breath.   apixaban 5 MG Tabs tablet Commonly known as: Eliquis Take 1 tablet (5 mg total) by mouth 2 (two) times daily.   atorvastatin 80 MG tablet Commonly known as: LIPITOR Take 1 tablet (80 mg total) by mouth daily.   blood glucose meter kit and supplies Kit Dispense based on patient and insurance preference. Use up to four times daily as directed. (FOR ICD-9 250.00, 250.01).   carvedilol 12.5 MG tablet Commonly known as: COREG Take 1 tablet (12.5 mg total) by mouth 2 (two) times daily with a meal.   colchicine 0.6 MG tablet On day 1 take 2 tablets at first sign of gout flare.  Take 1 tablet 1 hour later.  Then take 1 tablet daily starting the next day until resolution of gout flare.   empagliflozin 25 MG Tabs tablet Commonly known as:  Jardiance Take 1 tablet (25 mg total) by mouth daily before breakfast.   eplerenone 25 MG tablet Commonly known as: INSPRA Take 1 tablet (25 mg total) by mouth daily.   FreeStyle [place] 2 [facility] Check your blood sugar three times per day before meals and once before bedtime   FreeStyle Libre 2 Sensor Misc Apply one sensor to upper arm every 2 weeks   glucose blood test strip Commonly known as: Accu-Chek Aviva Plus Use as instructed for 3 times daily testing of blood glucose   hydrALAZINE 50 MG tablet Commonly known as: APRESOLINE Take 1.5 tablets (75 mg total) by mouth in the morning and at bedtime.   insulin lispro 100 UNIT/ML KwikPen Commonly known as: HumaLOG KwikPen Inject 6 Units into the skin with breakfast, with lunch, and with evening meal.   isosorbide mononitrate 120 MG 24 hr tablet Commonly known as: IMDUR Take 1 tablet (120 mg total) by mouth daily.   metFORMIN 1000 MG tablet Commonly known as: GLUCOPHAGE TAKE 1 TABLET BY MOUTH ONCE DAILY WITH BREAKFAST . APPOINTMENT REQUIRED FOR FUTURE REFILLS   nitroGLYCERIN 0.4 MG SL tablet Commonly known as: NITROSTAT Place 1 tablet (0.4 mg total) under the tongue every 5 (five) minutes as needed for chest pain (x 3 tabs daily).   Pen Needles 32G X 4 MM Misc 1 each by Does not apply route daily.   potassium chloride SA 20 MEQ tablet Commonly known as: KLOR-CON M Take 1 tablet (20 mEq total) by mouth daily.   sotalol 120 MG tablet Commonly known as: BETAPACE Take 1 tablet (120 mg total) by mouth 2 (two) times daily.   torsemide 20 MG tablet Commonly known as: DEMADEX TAKE 4 TABLETS BY MOUTH IN THE MORNING AND 2 IN THE EVENING   Tresiba FlexTouch 200 UNIT/ML FlexTouch Pen Generic drug: insulin degludec 56 units daily   TRUEplus Lancets 28G Misc 1 each by Does not apply route 3 (three) times daily.   Accu-Chek Softclix Lancets lancets Use as instructed for three times daily testing of blood glucose         Allergies:  Allergies  Allergen Reactions   Shrimp [Shellfish Allergy] Nausea And Vomiting   Ace Inhibitors Cough   Other Hives and Other (See Comments)    Patient reports developing hives after receiving "some antibiotic given in 1980''s at [facility]". He does not know which antibiotic.    Past Medical History:  Diagnosis Date   Anxiety    CAD (coronary artery disease), native coronary artery    a. Nonobstructive CAD by cath 2013 - diffuse  distal and branch vessel CAD, no severe disease in the major coronaries, LV mild global hypokinesis, EF 45%. b. ETT-Sestamibi 5/14: EF 31%, small fixed inferior defect with no ischemia.   Chronic CHF    a. Mixed ICM/NICM (?EtOH). EF 35% in 2008. Echo 5/13: EF 60-65%, mod LVH, EF 45% on V gram in 12/2011. EF 12/2012: EF 50-55%, mild LVH, inferobasal HK, mild MR. ETT-Ses 5/14 EF 41%. Cardiac MRI 5/14: EF 44%, mild global HK, subepicardial delayed enhancement in nonspecific RV insertion pattern.   COLONIC POLYPS, HX OF 12/30/2006   Gout    H/O atrial flutter 08/13/2005   a. Ablations in 2007, 2008.   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   "left shoulder"   PAF (paroxysmal atrial fibrillation)    On coumadin   Sleep apnea    "suppose to send mask but they never did" (05/03/2015)   Type II diabetes mellitus     Past Surgical History:  Procedure Laterality Date   A FLUTTER ABLATION  2007, 2008   catheter ablation    CARDIAC CATHETERIZATION N/A 05/03/2015   Procedure: Right/Left Heart Cath and Coronary Angiography;  Surgeon: Larey Dresser, MD;  Location: [place] [facility];  Service: Cardiovascular;   Laterality: N/A;   CARDIAC CATHETERIZATION N/A 05/03/2015   Procedure: Coronary Stent Intervention;  Surgeon: Jettie Booze, MD;  Location: [place] [facility];  Service: Cardiovascular;  Laterality: N/A;   CARDIAC CATHETERIZATION N/A 11/04/2015   Procedure: Left Heart Cath and Coronary Angiography;  Surgeon: Larey Dresser, MD;  Location: [place] [facility];  Service: Cardiovascular;  Laterality: N/A;   CARDIOVERSION N/A 08/24/2015   Procedure: CARDIOVERSION;  Surgeon: Thayer Headings, MD;  Location: [facility] ENDOSCOPY;  Service: Cardiovascular;  Laterality: N/A;   CHOLECYSTECTOMY N/A 02/07/2016   Procedure: LAPAROSCOPIC CHOLECYSTECTOMY WITH  INTRAOPERATIVE CHOLANGIOGRAM;  Surgeon: Greer Pickerel, MD;  Location: [facility];  Service: General;  Laterality: N/A;   COLONOSCOPY WITH PROPOFOL N/A 04/28/2019   Procedure: COLONOSCOPY WITH PROPOFOL;  Surgeon: Ronnette Juniper, MD;  Location: [place];  Service: Gastroenterology;  Laterality: N/A;   CORONARY ANGIOPLASTY  12/05/01   ESOPHAGOGASTRODUODENOSCOPY (EGD) WITH PROPOFOL N/A 04/27/2019   Procedure: ESOPHAGOGASTRODUODENOSCOPY (EGD) WITH PROPOFOL;  Surgeon: Ronnette Juniper, MD;  Location: [place];  Service: Gastroenterology;  Laterality: N/A;   GIVENS CAPSULE STUDY N/A 06/29/2019   Procedure: GIVENS CAPSULE STUDY;  Surgeon: Wilford Corner, MD;  Location: [place];  Service: Endoscopy;  Laterality: N/A;   LEFT HEART CATH AND CORONARY ANGIOGRAPHY N/A 12/28/2016   Procedure: Left Heart Cath and Coronary Angiography;  Surgeon: Larey Dresser, MD;  Location: [place] [facility];  Service: Cardiovascular;  Laterality: N/A;   LEFT HEART CATHETERIZATION WITH CORONARY ANGIOGRAM N/A 12/19/2011   Procedure: LEFT HEART CATHETERIZATION WITH CORONARY ANGIOGRAM;  Surgeon: Larey Dresser, MD;  Location: [facility] CATH LAB;  Service: Cardiovascular;  Laterality: N/A;   OSTEOCHONDROMA EXCISION Left 1972   "took bone tumor off my shoulder"   POLYPECTOMY  04/28/2019   Procedure:  POLYPECTOMY;  Surgeon: Ronnette Juniper, MD;  Location: [place];  Service: Gastroenterology;;   PRESSURE SENSOR/CARDIOMEMS  N/A 02/01/2020   Procedure: PRESSURE SENSOR/CARDIOMEMS;  Surgeon: Larey Dresser, MD;  Location: [place] [facility];  Service: Cardiovascular;  Laterality: N/A;   RIGHT HEART CATH N/A 02/01/2020   Procedure: RIGHT HEART CATH;  Surgeon: Larey Dresser, MD;  Location: [place] [facility];  Service: Cardiovascular;  Laterality: N/A;   RIGHT/LEFT HEART CATH AND CORONARY ANGIOGRAPHY N/A 07/01/2019   Procedure: RIGHT/LEFT HEART CATH AND CORONARY ANGIOGRAPHY;  Surgeon: Larey Dresser, MD;  Location: [place] [facility];  Service: Cardiovascular;  Laterality: N/A;    Family History  Problem Relation Age of Onset   Diabetes Mother    Hypertension Mother    Heart attack Neg Hx    Stroke Neg Hx     Social History:  reports that he quit smoking about 39 years ago. His smoking use included cigarettes. He has a 72.00 pack-year smoking history. He has never used smokeless tobacco. He reports current alcohol use of about 16.0 standard drinks of alcohol per week. He reports current drug use. Drug: Marijuana.  Review of Systems:  Last diabetic eye exam date 3/22  Last urine microalbumin date: 9/23  Last foot exam date: 9/22  Symptoms of neuropathy: Some tingling in the toes  Hypertension:   ACE/ARB medication: None  BP Readings from Last 3 Encounters:  11/14/22 120/74  09/12/22 (!) 140/80  07/23/22 124/62   He has chronic kidney disease:  Lab Results  Component Value Date   CREATININE 1.64 (H) 09/10/2022   CREATININE 1.38 (H) 07/18/2022   CREATININE 1.51 (H) 06/02/2022    Lipid management: Now appears to be on atorvastatin 80 mg    Lab Results  Component Value Date   CHOL 152 05/07/2022   CHOL 115 08/28/2021   CHOL 156 03/21/2021   Lab Results  Component Value Date   HDL 51.80 05/07/2022   HDL 45 08/28/2021   HDL 54 03/21/2021   Lab Results  Component  Value Date   LDLCALC 79 05/07/2022   LDLCALC 50 08/28/2021   LDLCALC 81 03/21/2021   Lab Results  Component Value Date   TRIG 103.0 05/07/2022   TRIG 98 08/28/2021   TRIG 107 03/21/2021   Lab Results  Component Value Date   CHOLHDL 3 05/07/2022   CHOLHDL 2.6 08/28/2021   CHOLHDL 2.9 03/21/2021   Lab Results  Component Value Date   LDLDIRECT 111.0 04/22/2015   LDLDIRECT 135.0 10/20/2012     Fibrosis 4 Score = 2.14       Fib-4 interpretation is not validated for people under 80 or over 57 years of age.      Examination:   BP 120/74 (BP Location: Left Arm, Patient Position: Sitting, Cuff Size: Normal)   Pulse (!) 53   Ht 5\' 11"  (1.803 m)   Wt 200 lb 6.4 oz (90.9 kg)   SpO2 98%   BMI 27.95 kg/m   Body mass index is 27.95 kg/m.    ASSESSMENT/ PLAN:    Diabetes type 2:   Current regimen: Lantus, Jardiance 25 mg, metformin  See history of present illness for detailed discussion of current diabetes management, blood sugar patterns and problems identified  A1c is still significantly high at 10.7  He has not taking his insulin regularly as before and also has not increased the basal insulin as directed If he tries to take his Lantus in the morning or bedtime he will forget Also not clear if he is taking mealtime shots With his sensor also  he has checked his blood sugars very sporadically as he forgets  Recommendations:  Discussed need to take insulin consistently as directed Tresiba 54 units a day at noon which she thinks is the most favorable time for him to remember He will switch to [place] instead of Lantus and new prescription was sent Humalog 20 units at each meal and 25 for starcy meals Need to make sure he tries to take this before every meal Check sugar at least 3x daily on the sensor To call if he is starting to get any low normal or low sugars Reduce drinking milk, juice and beer Continue Jardiance and metformin Consider switching to [place] 3 if  covered by insurance but likely not since he just got the sensor and meter last year sometime Again referred him to the diabetes educator and discussed need for him to have more diabetes education and review of his management and compliance  Creatinine levels have been variable and needs to be followed by nephrologist  Patient Instructions  Tresiba 54 units a day at noon  Humalog 20 units at each meal and 25 for starcy meals  Check sugar at least 3x daily  Less juice and beer  Total visit time for evaluation and management and counseling = 30 minutes  Henleigh Robello 11/14/2022, 3:10 PM

## 2022-11-16 ENCOUNTER — Other Ambulatory Visit: Payer: Self-pay | Admitting: *Deleted

## 2022-11-16 ENCOUNTER — Encounter: Payer: Self-pay | Admitting: Family Medicine

## 2022-11-16 ENCOUNTER — Ambulatory Visit (INDEPENDENT_AMBULATORY_CARE_PROVIDER_SITE_OTHER): Payer: 59

## 2022-11-16 ENCOUNTER — Ambulatory Visit (INDEPENDENT_AMBULATORY_CARE_PROVIDER_SITE_OTHER): Payer: 59 | Admitting: Family Medicine

## 2022-11-16 VITALS — BP 110/80 | HR 58 | Temp 98.1°F | Ht 71.0 in | Wt 201.0 lb

## 2022-11-16 DIAGNOSIS — J3489 Other specified disorders of nose and nasal sinuses: Secondary | ICD-10-CM

## 2022-11-16 DIAGNOSIS — Z794 Long term (current) use of insulin: Secondary | ICD-10-CM

## 2022-11-16 DIAGNOSIS — R0602 Shortness of breath: Secondary | ICD-10-CM | POA: Diagnosis not present

## 2022-11-16 DIAGNOSIS — R0989 Other specified symptoms and signs involving the circulatory and respiratory systems: Secondary | ICD-10-CM

## 2022-11-16 DIAGNOSIS — E1165 Type 2 diabetes mellitus with hyperglycemia: Secondary | ICD-10-CM

## 2022-11-16 DIAGNOSIS — R051 Acute cough: Secondary | ICD-10-CM | POA: Diagnosis not present

## 2022-11-16 DIAGNOSIS — B070 Plantar wart: Secondary | ICD-10-CM

## 2022-11-16 DIAGNOSIS — R202 Paresthesia of skin: Secondary | ICD-10-CM

## 2022-11-16 DIAGNOSIS — R059 Cough, unspecified: Secondary | ICD-10-CM | POA: Diagnosis not present

## 2022-11-16 MED ORDER — EMPAGLIFLOZIN 25 MG PO TABS
25.0000 mg | ORAL_TABLET | Freq: Every day | ORAL | 3 refills | Status: DC
Start: 2022-11-16 — End: 2023-01-23

## 2022-11-16 NOTE — Progress Notes (Signed)
Established Patient Office Visit   Subjective  Patient ID: [name] [name], male    DOB: Apr 14, 1956  Age: 67 y.o. MRN: [id number]  Chief Complaint  Patient presents with   Foot Problem    Pt is a 67 yo male with pmh sig for HTN, DM II, HLD, gout, history of A-fib on chronic anticoagulation, CKD 3, hep B, OSA, diastolic CHF, mitral valve disorder, Mobitz type I AV block who presents for follow-up.  Requesting referral to podiatry.  Having difficulty cutting thickened nails.  Also notes a bump with a "pearl" at the bottom of her right foot.  Patient with tingling sensation in bottom of left foot.  Blood sugar improving typically in the 200s.  Yesterday blood sugar stayed in the 100s.  This morning blood sugar 221.  Patient followed by endocrinology, Dr. Lucianne Muss, who is retiring soon.  Patient requesting help scheduling eye exam with Dr. Nile Riggs.  Patient notes cough, chest congestion, rhinorrhea x 1 week.  Denies headache, fever, chills, nausea, vomiting, ear pain/pressure, facial pain/pressure, sore throat.  Tried OTC Robitussin.      ROS Negative unless stated above    Objective:     BP 110/80   Pulse (!) 58   Temp 98.1 F (36.7 C) (Oral)   Ht 5\' 11"  (1.803 m)   Wt 201 lb (91.2 kg)   SpO2 98%   BMI 28.03 kg/m    Physical Exam Constitutional:      General: He is not in acute distress.    Appearance: Normal appearance.  HENT:     Head: Normocephalic and atraumatic.     Nose: Nose normal.     Mouth/Throat:     Mouth: Mucous membranes are moist.  Cardiovascular:     Rate and Rhythm: Normal rate and regular rhythm.     Heart sounds: Normal heart sounds. No murmur heard.    No gallop.  Pulmonary:     Effort: Pulmonary effort is normal. No respiratory distress.     Breath sounds: Wheezing and rhonchi present. No rales.  Skin:    General: Skin is warm and dry.  Neurological:     Mental Status: He is alert and oriented to person, place, and time.     Diabetic  Foot Exam - Simple   Simple Foot Form Diabetic Foot exam was performed with the following findings: Yes 11/16/2022  9:21 AM  Visual Inspection See comments: Yes Sensation Testing Intact to touch and monofilament testing bilaterally: Yes Pulse Check Posterior Tibialis and Dorsalis pulse intact bilaterally: Yes Comments Rough, dry skin on plantar surface of bilateral feet.  Plantar wart on right foot.  Thickened toenails bilaterally.  Minimal/nonexistent vibratory sense.     No results found for any visits on 11/16/22.    Assessment & Plan:  Type 2 diabetes mellitus with hyperglycemia, with long-term current use of insulin -     Ambulatory referral to Podiatry -     Ambulatory referral to Ophthalmology  Plantar wart -     Ambulatory referral to Podiatry  Acute cough  Chest congestion -     DG Chest 2 View  Rhinorrhea  Paresthesia of foot  Blood sugar uncontrolled.  Hemoglobin A1c 10.7% on 11/14/2022 with endocrinology.  Discussed the importance of glycemic control to prevent further complications.  Paresthesias of foot likely 2/2 diabetic neuropathy.  OTC antihistamine as needed.  Obtain CXR.  Further recommendations based on results.  Return in about 3 months (around 02/15/2023).   Carollee Herter  Merlene Laughter, MD

## 2022-11-16 NOTE — Patient Instructions (Signed)
A referral was placed for you to see if the podiatrist and ophthalmology from doctors..  It is expected clinical without scheduling these appointments.  An order was placed for chest x-ray.  You can get this done here in clinic.  If needed we will send in prescription based on results.  You can try over-the-counter allergy medications such as Allegra, Claritin, or Zyrtec to help with runny nose.  Is important that you gain better control of your blood sugar to help decrease the numbness/tingling sensation in your foot.

## 2022-11-26 DIAGNOSIS — E119 Type 2 diabetes mellitus without complications: Secondary | ICD-10-CM | POA: Diagnosis not present

## 2022-11-30 ENCOUNTER — Telehealth: Payer: Self-pay

## 2022-11-30 ENCOUNTER — Other Ambulatory Visit (HOSPITAL_COMMUNITY): Payer: Self-pay | Admitting: Internal Medicine

## 2022-11-30 NOTE — Telephone Encounter (Signed)
Contacted [employer] regarding to fax receive requesting for most recent chart notes.  Spoke to Weir, advise him pt see specialist and Dr. Salomon Fick doesn't pt insulin. Pt's endocrinology would be the one to send over chart notes. Verbalized understanding. He provided an update fax number : [phone]  Faxed the document to [facility].

## 2022-12-05 ENCOUNTER — Encounter: Payer: 59 | Attending: Endocrinology | Admitting: Nutrition

## 2022-12-05 DIAGNOSIS — E1165 Type 2 diabetes mellitus with hyperglycemia: Secondary | ICD-10-CM

## 2022-12-05 DIAGNOSIS — E118 Type 2 diabetes mellitus with unspecified complications: Secondary | ICD-10-CM | POA: Insufficient documentation

## 2022-12-05 NOTE — Progress Notes (Signed)
Patietn is

## 2022-12-06 ENCOUNTER — Telehealth: Payer: Self-pay | Admitting: Nutrition

## 2022-12-06 NOTE — Patient Instructions (Addendum)
Stop all sweet drinks and cold cereal and milk Take Lantus 50u before supper every night Take Novolog 20u before all meals  Scan sensor before meals and 2 hours after meals and at bedtime.

## 2022-12-06 NOTE — Telephone Encounter (Signed)
Patient reported taking his Lantus last night before supper, as well as his Novlog-20u.  FBS today was 168.  He was told that this will likely come down if he continues to take his insulin as directed before supper.  He agreed to do this and call me next week with  blood sugar readings.

## 2022-12-10 ENCOUNTER — Encounter (HOSPITAL_COMMUNITY): Payer: Self-pay | Admitting: Cardiology

## 2022-12-10 ENCOUNTER — Other Ambulatory Visit (HOSPITAL_COMMUNITY): Payer: Self-pay

## 2022-12-10 ENCOUNTER — Ambulatory Visit (HOSPITAL_COMMUNITY)
Admission: RE | Admit: 2022-12-10 | Discharge: 2022-12-10 | Disposition: A | Discharge status: Home / Self Care | Payer: 59 | Source: Ambulatory Visit | Attending: Cardiology | Admitting: Cardiology

## 2022-12-10 VITALS — BP 130/80 | HR 62 | Wt 203.0 lb

## 2022-12-10 DIAGNOSIS — G4733 Obstructive sleep apnea (adult) (pediatric): Secondary | ICD-10-CM | POA: Diagnosis not present

## 2022-12-10 DIAGNOSIS — N183 Chronic kidney disease, stage 3 unspecified: Secondary | ICD-10-CM | POA: Diagnosis not present

## 2022-12-10 DIAGNOSIS — Z7984 Long term (current) use of oral hypoglycemic drugs: Secondary | ICD-10-CM | POA: Insufficient documentation

## 2022-12-10 DIAGNOSIS — Z79899 Other long term (current) drug therapy: Secondary | ICD-10-CM | POA: Diagnosis not present

## 2022-12-10 DIAGNOSIS — Z91148 Patient's other noncompliance with medication regimen for other reason: Secondary | ICD-10-CM | POA: Diagnosis not present

## 2022-12-10 DIAGNOSIS — I25119 Atherosclerotic heart disease of native coronary artery with unspecified angina pectoris: Secondary | ICD-10-CM | POA: Diagnosis not present

## 2022-12-10 DIAGNOSIS — I480 Paroxysmal atrial fibrillation: Secondary | ICD-10-CM | POA: Diagnosis not present

## 2022-12-10 DIAGNOSIS — R0789 Other chest pain: Secondary | ICD-10-CM | POA: Insufficient documentation

## 2022-12-10 DIAGNOSIS — I4892 Unspecified atrial flutter: Secondary | ICD-10-CM | POA: Insufficient documentation

## 2022-12-10 DIAGNOSIS — Z955 Presence of coronary angioplasty implant and graft: Secondary | ICD-10-CM | POA: Insufficient documentation

## 2022-12-10 DIAGNOSIS — E785 Hyperlipidemia, unspecified: Secondary | ICD-10-CM | POA: Diagnosis not present

## 2022-12-10 DIAGNOSIS — I5032 Chronic diastolic (congestive) heart failure: Secondary | ICD-10-CM | POA: Diagnosis not present

## 2022-12-10 DIAGNOSIS — R0602 Shortness of breath: Secondary | ICD-10-CM | POA: Diagnosis not present

## 2022-12-10 DIAGNOSIS — Z7901 Long term (current) use of anticoagulants: Secondary | ICD-10-CM | POA: Diagnosis not present

## 2022-12-10 DIAGNOSIS — I130 Hypertensive heart and chronic kidney disease with heart failure and stage 1 through stage 4 chronic kidney disease, or unspecified chronic kidney disease: Secondary | ICD-10-CM | POA: Insufficient documentation

## 2022-12-10 DIAGNOSIS — E1122 Type 2 diabetes mellitus with diabetic chronic kidney disease: Secondary | ICD-10-CM | POA: Diagnosis not present

## 2022-12-10 DIAGNOSIS — R002 Palpitations: Secondary | ICD-10-CM | POA: Insufficient documentation

## 2022-12-10 LAB — LIPID PANEL
Cholesterol: 158 mg/dL (ref 0–200)
HDL: 44 mg/dL (ref >40)
LDL Cholesterol: 87 mg/dL (ref 0–99)
Total CHOL/HDL Ratio: 3.6 RATIO
Triglycerides: 136 mg/dL (ref <150)
VLDL: 27 mg/dL (ref 0–40)

## 2022-12-10 LAB — BASIC METABOLIC PANEL
Anion gap: 13 (ref 5–15)
BUN: 19 mg/dL (ref 8–23)
CO2: 26 mmol/L (ref 22–32)
Calcium: 9.4 mg/dL (ref 8.9–10.3)
Chloride: 99 mmol/L (ref 98–111)
Creatinine, Ser: 1.48 mg/dL — ABNORMAL HIGH (ref 0.61–1.24)
GFR, Estimated: 51 mL/min — ABNORMAL LOW (ref >60)
Glucose, Bld: 191 mg/dL — ABNORMAL HIGH (ref 70–99)
Potassium: 4 mmol/L (ref 3.5–5.1)
Sodium: 138 mmol/L (ref 135–145)

## 2022-12-10 LAB — BRAIN NATRIURETIC PEPTIDE: B Natriuretic Peptide: 158.5 pg/mL — ABNORMAL HIGH (ref 0.0–100.0)

## 2022-12-10 MED ORDER — EPLERENONE 50 MG PO TABS: 50.0000 mg | ORAL_TABLET | Freq: Every day | ORAL | 3 refills | Status: DC

## 2022-12-10 MED ORDER — ISOSORBIDE MONONITRATE ER 120 MG PO TB24
120.0000 mg | ORAL_TABLET | Freq: Every day | ORAL | 3 refills | Status: DC
Start: 2022-12-10 — End: 2023-01-28

## 2022-12-10 NOTE — H&P (View-Only) (Signed)
 Patient ID: [name] [name], male   DOB: 06/01/1956, 67 y.o.   MRN: [id number] PCP: Banks, Shannon R, MD HF Cardiology: Dr. Jayln Madeira  67 y.o. with history of HTN, diabetes, paroxysmal atrial fibrillation, and CAD presents for cardiology followup.  He was admitted in 5/13 with chest pain with exertion.  LHC was done showing global HK with EF 45% and diffuse, severe distal and branch vessel disease.  There was no interventional option, but it was suspect that this disease could be causing his anginal-type pain.  Echo at that time was read as showing EF 60-65%.  He was admitted in 5/14 with hypertensive urgency and chest pain.  He ruled out for MI and BP was controlled.  His EF was 50-55% by echo.  ETT-Sestamibi done as outpatient showed small fixed inferior defect with no ischemia but EF was 31%.  He was quite hypertensive during the study.  Cardiac MRI done to confirm EF in 5/14 showed EF 44%.  He had an episode of atrial fibrillation/RVR in 3/15 but went back into NSR.  Echo in 2/16 showed EF 55-60% with moderate diastolic dysfunction.   He had a Cardiolite in 9/16 showing inferior ischemia.  He was having exertional dyspnea with less activity than normal.  There was concern for worsened CAD and he was sent for LHC/RHC in 9/16. By RHC, filling pressures were not significantly elevated. He had a severe distal RCA stenosis that was treated with DES x 2.  Echo showed EF 55-60%.    In 10/16, he was admitted with acute cholecystitis.  He received a cholecystostomy tube.  Cholecystectomy done in 6/17.   Atrial fibrillation in 1/17, cardioverted to NSR.   Recurrent concerning chest pain in 3/17.  Repeat LHC showed diffuse distal and branch vessel disease consistent with poorly-controlled DM, but patent RCA stents.    He was admitted with atrial fibrillation in 12/17. Started on sotalol and converted to NSR.    He had recurrent chest pain in 5/18 with abnormal Cardiolite.  Cath was done again in 5/18,  showing diffuse CAD with no good interventional options.   Admitted 11/20 with increased dyspnea and exertional chest pain. Hgb went down to 8.1. GI consulted.  Had LHC that showed severe distal and branch vessel disease but no interventional targets and not good anatomy for CABG. Continued on medical management. Capsule endoscopy without recent or active bleeding, no AVMs. He went into atrial flutter during his hospitalization. He went back into NSR, however, after discharge.  +Snoring and daytime sleepiness.   He has had episodes of syncope associated with vigorous coughing.   He was in the ER with atrial flutter and dyspnea in 4/21. Zio monitor in 9/22 showed NSR with PACs.   Follow up 1/23 not compliant with medications or Cardiomems.   Follow up 5/23, continued to miss medications, not using Cardiomems. Mild volume overload, instructed to restart meds.  Evaluated in ED 01/14/22 for cough, found to be COVID +. Received molnupiravir.  Admitted 01/22/22 with CP. Cards consulted, troponin negative x 2, Echo showed EF 55-60%, severe asymmetric LV hypertrophy, normal RV. No new WMA and CP resolved, so LHC deferred.  Coreg decreased to 12.5 bid due to low BP and Mobitz Type 1. Discharged home, weight 200 lbs.   Seen in ED 10/23 after a fall, ETOH level 176. CT head negative.   cMRI 10/23 concerning for hypertrophic cardiomyopathy.  [employer] gene testing showed that he was a heterozygote for an LMNA gene mutation   of unknown significance.  LMNA mutations can cause a HCM phenotype.   Today he returns for HF follow up. Not using CPAP. He has not used his Cardiomems since 9/23. He is taking most of his medications most of the time.  He missed all his medications yesterday as he was out of town.  He has been more short of breath than usual for about 4 days.  He has noted occasional palpitations over that time.  He is in atrial flutter on ECG today.  Weight is stable.  He has been short of breath walking  short distances and has also had continuous chest heaviness for several days.  No orthopnea/PND.  No lightheadedness.    ECG (personally reviewed): Atrial flutter, QTc 492  Cardiomems: No readings x months.  Labs (5/13): K 3.6, creatinine 0.93, LDL 85, HDL 57 Labs (7/13): K 3.9, creatinine 1.1, LDL 57, HDL 51  Labs (5/14): K 4, creatinine 1.25 Labs (8/14): K 3.7, creatinine 0.9, LDL 103, HDL 42 Labs (8/15): K 3.5 creatinine 1.0, BNP 75 Labs (2/16): K 3.8, creatinine 1.1 Labs (9/16): K 3.5, creatinine 1.03, BNP 59, HDL 40, LDL 111, TGs 333 Labs (11/16): K 3.6, creaitnine 1.07, LFTs normal, LDL 44 Labs (6/17): K 3.4, creatinine 1.52 => 1.13 Labs (1/18): K 3.9, creatinine 1.25 Labs (6/18): K 3.8, creatinine 1.21, HCT 39.7 Labs (8/18): LDL 39, HDL 43 Labs (12/18): 4.4, creatinine 1.25, LFTs normal Labs (8/19): K 3.6, creatinine 1.13, LDL 68, HDL 49  Labs (11/20): K 3.5, creatinine 1.33, LDL 25 Labs (4/21): K 4.0, SCr 1.14, BNP 266  Labs (6/21): K 3.7, creatinine 1.25 Labs (12/21): LDL 25 Labs (1/22): BNP 132 Labs (2/22): K 3.4, creatinine 1.44 Labs (8/22): LDL 81 Labs (9/22): K 3.2, creatinine 1.53 Labs (10/22): K 3.7, creatinine 1.39 Labs (11/22): K 4.4, creatinine 1.47 Labs (3/23): K 4.4, creatinine 1.44 Labs (5/23): K 4.2, creatinine 1.78 Labs (6/23): K 4.1, creatinine 1.24, hgb 15.8 Labs (7/23): K 4.1, creatinine 1.55 Labs (10/23): K 3.7, creatinine 1.51 Labs (1/24): K 3.7, creatinine 1.64  PMH: 1. DM2 2. Gout 3. HTN: Cough with ACEI.  4. Cardiomyopathy: Suspect mixed ischemic/nonischemic (?ETOH-related).  EF 35% in 2008.  Echo in 5/13 showed EF 60-65% with moderate LVH but EF was 45% on LV-gram in 5/13.  Echo (5/14) with EF 50-55%, mild LVH, inferobasal HK, mild MR.  ETT-Sestamibi in 5/14, however, showed EF 31%.  Cardiac MRI (6/14) with EF 44%, mild global hypokinesis, subepicardial delayed enhancement in a nonspecific RV insertion site pattern.  Echo (2/16) with EF 55-60%,  moderate diastolic dysfunction. RHC (9/16) with mean RA 8, PA 39/20 mean 28, mean PCWP 14, CI 2.2. Echo (9/16) with EF 55-60%, grade II diastolic dysfunction.  - Echo (6/17) with EF 55-60%, moderate LVH, grade II diastolic dysfunction, mild AI, mild MR.  - RHC (11/20): mean RA 4, PA 46/14 mean 26, mean PCWP 13, CI 3.23 - Echo (9/20): EF 60-65%, moderate LVH, normal RV size and systolic function, mild-moderate MR, mild AI.  - Echo (5/21): EF 55%, RV normal - Has Cardiomems.  - Echo (6/23): EF 55-60%, severe asymmetric LV hypertrophy, normal RV. - cMRI (11/23): LVEF 47%, no LVOT, moderate to severe asymmetric hypertrophy of the basal to mid inferoseptal and anteroseptum, RVEF 46%, ECV 34%, non-coronary LGE pattern most consistent with hypertrophic CM 5. CAD: LHC in 2010 with mild nonobstructive disease.  LHC (5/13) with diffuse distal and branch vessel disease, mild global hypokinesis and EF 45%.  ETT-Sestamibi (  5/14): EF 31%, small fixed inferior defect with no ischemia.  Lexiscan Cardiolite (9/16) with EF 26%, moderate inferior defect that was partially reversible, suggesting ischemia => High risk study. LHC (9/16) with 40-50% mLAD, diffuse up to 50% distal LAD, 90% ostium of branch off ramus, 95% dRCA => DES to RCA x 2, EF 55-60%.   - LHC (3/17) with diffuse branch and distal vessel disease c/w poorly controlled DM, RCA stents patent.  - LHC (5/18): Diffuse CAD with no good interventional option. 60% mRCA, 90% dLAD, 50% serial mid ramus stenoses, ramus branches with ostial 80-90% stenoses. - LHC (11/20): Severe distal and branch vessel disease but no interventional targets and not good anatomy for CABG  6. H/o chronic HBV 7. Osteochondroma left shoulder.  8. Hyperlipidemia 9. Paroxysmal atrial fibrillation: Coumadin.  Developed cough and increased ESR with amiodarone.  DCCV in 1/17.  Recurrent atrial fibrillation in 12/17, sotalol started.  10. Atrial flutter: had ablations in 2007 and 2008.  - Zio  patch in 4/21 with 20% atrial flutter.  11. OSA on CPAP.  12. Acute cholecystitis (10/16): Cholecystostomy tube placed.  Cholecystectomy 6/17.  13. CKD stage 3 14. Anemia - Suspected GI bleed but EGD/c-scope 9/20 without significant findings.  - Capsule Endoscopy (11/20) without recent or active bleeding, no AVMs.  15. Syncope: Suspected cough syncope.  16. OSA  SH: Married, prior smoker.  Some ETOH, occasionally heavy in past.  Does use occasional marijuana. 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   FH: CAD  ROS: All systems reviewed and negative except as per HPI.   Current Outpatient Medications  Medication Sig Dispense Refill   ACCU-CHEK SOFTCLIX LANCETS lancets Use as instructed for three times daily testing of blood glucose 100 each 12   acetaminophen (TYLENOL) 500 MG tablet Take 1,000 mg by mouth every 6 (six) hours as needed (pain).     albuterol (VENTOLIN HFA) 108 (90 Base) MCG/ACT inhaler Inhale 1-2 puffs into the lungs every 6 (six) hours as needed for wheezing or shortness of breath. 8 g 0   apixaban (ELIQUIS) 5 MG TABS tablet Take 1 tablet (5 mg total) by mouth 2 (two) times daily. 60 tablet 3   atorvastatin (LIPITOR) 80 MG tablet Take 1 tablet (80 mg total) by mouth daily. 30 tablet 3   blood glucose meter kit and supplies KIT Dispense based on patient and insurance preference. Use up to four times daily as directed. (FOR ICD-9 250.00, 250.01). 1 each 0   Blood Glucose Monitoring Suppl (ACCU-CHEK AVIVA PLUS) w/Device KIT Use as directed for 3 times daily testing of blood glucose 1 kit 0   carvedilol (COREG) 12.5 MG tablet Take 1 tablet (12.5 mg total) by mouth 2 (two) times daily with a meal. 60 tablet 3   colchicine 0.6 MG tablet On day 1 take 2 tablets at first sign of gout flare.  Take 1 tablet 1 hour later.  Then take 1 tablet daily starting the next day until resolution of gout flare. 10 tablet 1   Continuous Blood Gluc Receiver (FREESTYLE LIBRE 2 READER)  DEVI Check your blood sugar three times per day before meals and once before bedtime 2 each 11   empagliflozin (JARDIANCE) 25 MG TABS tablet Take 1 tablet (25 mg total) by mouth daily before breakfast. 30 tablet 3   glucose blood (ACCU-CHEK AVIVA PLUS) test strip Use as instructed for 3 times daily testing of blood glucose 100 each 12   hydrALAZINE (APRESOLINE)   50 MG tablet Take 1.5 tablets (75 mg total) by mouth in the morning and at bedtime. 90 tablet 3   Insulin Aspart (NOVOLOG [place]) Inject 20 Units into the skin 3 (three) times daily before meals.     insulin glargine (LANTUS) 100 UNIT/ML injection Inject 50 Units into the skin daily.     Insulin Pen Needle (PEN NEEDLES) 32G X 4 MM MISC 1 each by Does not apply route daily. 100 each 3   Lancet Devices (ACCU-CHEK SOFTCLIX) lancets Use as instructed for 3 times daily testing of blood glucose 1 each 0   metFORMIN (GLUCOPHAGE) 1000 MG tablet TAKE 1 TABLET BY MOUTH ONCE DAILY WITH BREAKFAST . APPOINTMENT REQUIRED FOR FUTURE REFILLS 30 tablet 2   nitroGLYCERIN (NITROSTAT) 0.4 MG SL tablet Place 1 tablet (0.4 mg total) under the tongue every 5 (five) minutes as needed for chest pain (x 3 tabs daily). 60 tablet 1   potassium chloride SA (KLOR-CON M) 20 MEQ tablet Take 1 tablet (20 mEq total) by mouth daily. 30 tablet 3   sotalol (BETAPACE) 120 MG tablet Take 1 tablet (120 mg total) by mouth 2 (two) times daily. 60 tablet 3   torsemide (DEMADEX) 20 MG tablet Take 80 mg by mouth daily.     TRUEPLUS LANCETS 28G MISC 1 each by Does not apply route 3 (three) times daily. 100 each 12   eplerenone (INSPRA) 50 MG tablet Take 1 tablet (50 mg total) by mouth daily. 90 tablet 3   insulin degludec (TRESIBA FLEXTOUCH) 200 UNIT/ML FlexTouch Pen 56 units daily (Patient not taking: Reported on 12/10/2022) 9 mL 1   isosorbide mononitrate (IMDUR) 120 MG 24 hr tablet Take 1 tablet (120 mg total) by mouth daily. 90 tablet 3   No current facility-administered medications for  this encounter.   BP 130/80   Pulse 62   Wt 92.1 kg (203 lb)   SpO2 97%   BMI 28.31 kg/m    Wt Readings from Last 3 Encounters:  12/10/22 92.1 kg (203 lb)  11/16/22 91.2 kg (201 lb)  11/14/22 90.9 kg (200 lb 6.4 oz)   PHYSICAL EXAM: General: NAD Neck: JVP 8 cm, no thyromegaly or thyroid nodule.  Lungs: Clear to auscultation bilaterally with normal respiratory effort. CV: Nondisplaced PMI.  Heart irregular S1/S2, no S3/S4, no murmur.  No peripheral edema.  No carotid bruit.  Normal pedal pulses.  Abdomen: Soft, nontender, no hepatosplenomegaly, no distention.  Skin: Intact without lesions or rashes.  Neurologic: Alert and oriented x 3.  Psych: Normal affect. Extremities: No clubbing or cyanosis.  HEENT: Normal.   Assessment/Plan: 1. Chronic diastolic CHF: EF 47% on cardiac MRI in 6/14 but EF improved back to 55-60% on 2/16 echo.  However, EF down to 26% on Cardiolite in 9/16.  Echo in 9/20 showed EF 60-65%, moderate LVH, normal RV, mild-moderate MR.  Echo in 5/21 with EF 55%, normal RV.  He has a Cardiomems device, but has not used in months.  Echo in 6/23 showed EF 55-60% with severe asymmetric septal hypertrophy.  Cardiac MRI (10/23) showed LVEF 47%, no LVOT, moderate to severe asymmetric hypertrophy of the basal to mid inferoseptal and anteroseptum, RVEF 46%, ECV 34%, non-coronary LGE pattern most consistent with hypertrophic CM. 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  LMNA mutations can cause a HCM phenotype.  NYHA class III symptoms, worse recently in setting of onset of atrial flutter most likely.   Suspect mild volume overload.  - Increase eplerenone to 50 mg daily.  BMET/BNP today, BMET in 10 days.  - Increase torsemide to 80 qam/40 qpm x 2 days then back to 80 mg daily.  - Continue empagliflozin 10 mg daily.  - Continue hydralazine 75 mg bid (does not take tid) + Imdur 120 mg daily. - Continue carvedilol 6.25 mg  bid. - Will refer to genetic evaluation with Dr. Sumy Joseph given LMNA mutation that may be associated with HCM phenotype.  - I encouraged him to send Cardiomems at least 2-3 times/week, he says he will try.   2. Atrial fibrillation/flutter: Paroxysmal. He has had atrial flutter ablations x 2 remotely.  He is on sotalol. He tolerates atrial arrhythmias poorly, seems to worsen CHF.  Zio patch in 4/21 with 20% atrial flutter. He saw EP, repeat ablation was not recommended. Repeat Zio in 9/22 showed only NSR and PACs.  However, today he is back in atrial flutter with worsened CHF symptoms and mild volume overload.     - Continue sotalol, QTc acceptable.  - Continue Eliquis.  - I will arrange for TEE-DCCV later this week to get him back in NSR.  Will need TEE as he is not totally compliant with Eliquis dosing. We discussed risks/benefits and he agrees to procedure.  3. Coronary artery disease: LHC in 9/16 showed diffuse CAD, most significant involving the distal RCA.  Patient had DES x 2 to RCA.  Cath in 3/17 with patent RCA stents and diffuse distal and branch vessel disease concerning for poorly controlled diabetes.  Cath in 5/18 again showed diffuse CAD without good interventional target.  Cath in 11/20 showed severe distal and branch vessel disease but no interventional targets and not good anatomy for CABG.  He has had some atypical continuous chest heaviness that I think is related to CHF rather than ischemia.  - No ASA, given Eliquis use.  - Continue atorvastatin 80 mg daily, check lipids today. - Continue Coreg and Imdur daily for angina control.  - Avoid ranolazine with sotalol use.  4. Hyperlipidemia:  On atorvastatin, check lipids today.  5. Hypertension: BP controlled.  6. OSA: Having trouble with CPAP, needs to get in with sleep medicine.  7. CKD stable 3: BMET today.  8. DM: Per PCP.  9. ETOH use: Discussed limiting ETOH use. 10. Non-compliance: Doing better than in the past but still  too sporadic with his meds.   Follow up in 1 month with APP.   Jil Penland,  12/10/2022 

## 2022-12-10 NOTE — Patient Instructions (Addendum)
CHANGE Torsemide to 80 mg in the morning and 40 mg in the evening for 2 DAYS ONLY then go back to 80 mg daily   INCREASE Eplerenone to 50 mg daily.  Labs done today, your results will be available in MyChart, we will contact you for abnormal readings.  Repeat blood work in 10 days   You are scheduled for a TEE (Transesophageal Echocardiogram) Guided Cardioversion on Friday, May 3 with Dr. Shirlee Latch .  Please arrive at the [facility] (Main Entrance A) at [facility]: [address] [place], [place] [address] at 6:30 AM (This time is 1 hour(s) before your procedure to ensure your preparation). Free valet parking service is available. You will check in at ADMITTING. The support person will be asked to wait in the waiting room.  It is OK to have someone drop you off and come back when you are ready to be discharged.     DIET:  Nothing to eat or drink after midnight except a sip of water with medications (see medication instructions below)  MEDICATION INSTRUCTIONS:  HOLD TORSEMIDE, METFORMIN, EPLERENONE, AND JARDIANCE THE MORNING OF YOUR PROCEDURE.  TAKE 25 UNITS OF LANTUS THE NIGHT BEFORE YOUR SURGERY, HOLD LANTUS AND ALL INSULINS THE MORNING OF PROCEDURE    Continue taking your anticoagulant (blood thinner): Apixaban (Eliquis).  You will need to continue this after your procedure until you are told by your provider that it is safe to stop.     FYI:  For your safety, and to allow [place] to monitor your vital signs accurately during the surgery/procedure we request: If you have artificial nails, gel coating, SNS etc, please have those removed prior to your surgery/procedure. Not having the nail coverings /polish removed may result in cancellation or delay of your surgery/procedure.  You must have a responsible person to drive you home and stay in the waiting area during your procedure. Failure to do so could result in cancellation.  Bring your insurance cards.  *Special Note: Every effort  is made to have your procedure done on time. Occasionally there are emergencies that occur at the hospital that may cause delays. Please be patient if a delay does occur.   Your physician recommends that you schedule a follow-up appointment in: 1 month  If you have any questions or concerns before your next appointment please send [place] a message through [place] or call our office at [phone].    TO LEAVE A MESSAGE FOR THE NURSE SELECT OPTION 2, PLEASE LEAVE A MESSAGE INCLUDING: YOUR NAME DATE OF BIRTH CALL BACK NUMBER REASON FOR CALL**this is important as we prioritize the call backs  YOU WILL RECEIVE A CALL BACK THE SAME DAY AS LONG AS YOU CALL BEFORE 4:00 PM  At the [facility], you and your health needs are our priority. As part of our continuing mission to provide you with exceptional heart care, we have created designated Provider Care Teams. These Care Teams include your primary Cardiologist (physician) and Advanced Practice Providers (APPs- Physician Assistants and Nurse Practitioners) who all work together to provide you with the care you need, when you need it.   You may see any of the following providers on your designated Care Team at your next follow up: Dr Arvilla Meres Dr Marca Ancona Dr. Marcos Eke, NP Robbie Lis, [facility] [place], [place] [name], NP Karle Plumber, PharmD   Please be sure to bring in all your medications bottles to every  appointment.    Thank you for choosing [facility]

## 2022-12-10 NOTE — Progress Notes (Signed)
Patient ID: [name] [name], male   DOB: 21-Jun-1956, 67 y.o.   MRN: [id number] PCP: Deeann Saint, MD HF Cardiology: Dr. Shirlee Latch  67 y.o. with history of HTN, diabetes, paroxysmal atrial fibrillation, and CAD presents for cardiology followup.  He was admitted in 5/13 with chest pain with exertion.  LHC was done showing global HK with EF 45% and diffuse, severe distal and branch vessel disease.  There was no interventional option, but it was suspect that this disease could be causing his anginal-type pain.  Echo at that time was read as showing EF 60-65%.  He was admitted in 5/14 with hypertensive urgency and chest pain.  He ruled out for MI and BP was controlled.  His EF was 50-55% by echo.  ETT-Sestamibi done as outpatient showed small fixed inferior defect with no ischemia but EF was 31%.  He was quite hypertensive during the study.  Cardiac MRI done to confirm EF in 5/14 showed EF 44%.  He had an episode of atrial fibrillation/RVR in 3/15 but went back into NSR.  Echo in 2/16 showed EF 55-60% with moderate diastolic dysfunction.   He had a Cardiolite in 9/16 showing inferior ischemia.  He was having exertional dyspnea with less activity than normal.  There was concern for worsened CAD and he was sent for [facility] in 9/16. By RHC, filling pressures were not significantly elevated. He had a severe distal RCA stenosis that was treated with DES x 2.  Echo showed EF 55-60%.    In 10/16, he was admitted with acute cholecystitis.  He received a cholecystostomy tube.  Cholecystectomy done in 6/17.   Atrial fibrillation in 1/17, cardioverted to NSR.   Recurrent concerning chest pain in 3/17.  Repeat LHC showed diffuse distal and branch vessel disease consistent with poorly-controlled DM, but patent RCA stents.    He was admitted with atrial fibrillation in 12/17. Started on sotalol and converted to NSR.    He had recurrent chest pain in 5/18 with abnormal Cardiolite.  Cath was done again in 5/18,  showing diffuse CAD with no good interventional options.   Admitted 11/20 with increased dyspnea and exertional chest pain. Hgb went down to 8.1. GI consulted.  Had LHC that showed severe distal and branch vessel disease but no interventional targets and not good anatomy for CABG. Continued on medical management. Capsule endoscopy without recent or active bleeding, no AVMs. He went into atrial flutter during his hospitalization. He went back into NSR, however, after discharge.  +Snoring and daytime sleepiness.   He has had episodes of syncope associated with vigorous coughing.   He was in the ER with atrial flutter and dyspnea in 4/21. Zio monitor in 9/22 showed NSR with PACs.   Follow up 1/23 not compliant with medications or Cardiomems.   Follow up 5/23, continued to miss medications, not using Cardiomems. Mild volume overload, instructed to restart meds.  Evaluated in ED 01/14/22 for cough, found to be COVID +. Received molnupiravir.  Admitted 01/22/22 with CP. Cards consulted, troponin negative x 2, Echo showed EF 55-60%, severe asymmetric LV hypertrophy, normal RV. No new WMA and CP resolved, so LHC deferred.  Coreg decreased to 12.5 bid due to low BP and Mobitz Type 1. Discharged home, weight 200 lbs.   Seen in ED 10/23 after a fall, ETOH level 176. CT head negative.   cMRI 10/23 concerning for hypertrophic cardiomyopathy.  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  LMNA mutations can cause a HCM phenotype.   Today he returns for HF follow up. Not using CPAP. He has not used his Cardiomems since 9/23. He is taking most of his medications most of the time.  He missed all his medications yesterday as he was out of town.  He has been more short of breath than usual for about 4 days.  He has noted occasional palpitations over that time.  He is in atrial flutter on ECG today.  Weight is stable.  He has been short of breath walking  short distances and has also had continuous chest heaviness for several days.  No orthopnea/PND.  No lightheadedness.    ECG (personally reviewed): Atrial flutter, QTc 492  Cardiomems: No readings x months.  Labs (5/13): K 3.6, creatinine 0.93, LDL 85, HDL 57 Labs (7/13): K 3.9, creatinine 1.1, LDL 57, HDL 51  Labs (5/14): K 4, creatinine 1.25 Labs (8/14): K 3.7, creatinine 0.9, LDL 103, HDL 42 Labs (8/15): K 3.5 creatinine 1.0, BNP 75 Labs (2/16): K 3.8, creatinine 1.1 Labs (9/16): K 3.5, creatinine 1.03, BNP 59, HDL 40, LDL 111, TGs 333 Labs (11/16): K 3.6, creaitnine 1.07, LFTs normal, LDL 44 Labs (6/17): K 3.4, creatinine 1.52 => 1.13 Labs (1/18): K 3.9, creatinine 1.25 Labs (6/18): K 3.8, creatinine 1.21, HCT 39.7 Labs (8/18): LDL 39, HDL 43 Labs (12/18): 4.4, creatinine 1.25, LFTs normal Labs (8/19): K 3.6, creatinine 1.13, LDL 68, HDL 49  Labs (11/20): K 3.5, creatinine 1.33, LDL 25 Labs (4/21): K 4.0, SCr 1.14, BNP 266  Labs (6/21): K 3.7, creatinine 1.25 Labs (12/21): LDL 25 Labs (1/22): BNP 132 Labs (2/22): K 3.4, creatinine 1.44 Labs (8/22): LDL 81 Labs (9/22): K 3.2, creatinine 1.53 Labs (10/22): K 3.7, creatinine 1.39 Labs (11/22): K 4.4, creatinine 1.47 Labs (3/23): K 4.4, creatinine 1.44 Labs (5/23): K 4.2, creatinine 1.78 Labs (6/23): K 4.1, creatinine 1.24, hgb 15.8 Labs (7/23): K 4.1, creatinine 1.55 Labs (10/23): K 3.7, creatinine 1.51 Labs (1/24): K 3.7, creatinine 1.64  PMH: 1. DM2 2. Gout 3. HTN: Cough with ACEI.  4. Cardiomyopathy: Suspect mixed ischemic/nonischemic (?ETOH-related).  EF 35% in 2008.  Echo in 5/13 showed EF 60-65% with moderate LVH but EF was 45% on LV-gram in 5/13.  Echo (5/14) with EF 50-55%, mild LVH, inferobasal HK, mild MR.  ETT-Sestamibi in 5/14, however, showed EF 31%.  Cardiac MRI (6/14) with EF 44%, mild global hypokinesis, subepicardial delayed enhancement in a nonspecific RV insertion site pattern.  Echo (2/16) with EF 55-60%,  moderate diastolic dysfunction. RHC (9/16) with mean RA 8, PA 39/20 mean 28, mean PCWP 14, CI 2.2. Echo (9/16) with EF 55-60%, grade II diastolic dysfunction.  - Echo (6/17) with EF 55-60%, moderate LVH, grade II diastolic dysfunction, mild AI, mild MR.  - RHC (11/20): mean RA 4, PA 46/14 mean 26, mean PCWP 13, CI 3.23 - Echo (9/20): EF 60-65%, moderate LVH, normal RV size and systolic function, mild-moderate MR, mild AI.  - Echo (5/21): EF 55%, RV normal - Has Cardiomems.  - Echo (6/23): EF 55-60%, severe asymmetric LV hypertrophy, normal RV. - cMRI (11/23): LVEF 47%, no LVOT, moderate to severe asymmetric hypertrophy of the basal to mid inferoseptal and anteroseptum, RVEF 46%, ECV 34%, non-coronary LGE pattern most consistent with hypertrophic CM 5. CAD: LHC in 2010 with mild nonobstructive disease.  LHC (5/13) with diffuse distal and branch vessel disease, mild global hypokinesis and EF 45%.  ETT-Sestamibi (  5/14): EF 31%, small fixed inferior defect with no ischemia.  Lexiscan Cardiolite (9/16) with EF 26%, moderate inferior defect that was partially reversible, suggesting ischemia => High risk study. LHC (9/16) with 40-50% mLAD, diffuse up to 50% distal LAD, 90% ostium of branch off ramus, 95% dRCA => DES to RCA x 2, EF 55-60%.   - LHC (3/17) with diffuse branch and distal vessel disease c/w poorly controlled DM, RCA stents patent.  - LHC (5/18): Diffuse CAD with no good interventional option. 60% mRCA, 90% dLAD, 50% serial mid ramus stenoses, ramus branches with ostial 80-90% stenoses. - LHC (11/20): Severe distal and branch vessel disease but no interventional targets and not good anatomy for CABG  6. H/o chronic HBV 7. Osteochondroma left shoulder.  8. Hyperlipidemia 9. Paroxysmal atrial fibrillation: Coumadin.  Developed cough and increased ESR with amiodarone.  DCCV in 1/17.  Recurrent atrial fibrillation in 12/17, sotalol started.  10. Atrial flutter: had ablations in 2007 and 2008.  - Zio  patch in 4/21 with 20% atrial flutter.  11. OSA on CPAP.  12. Acute cholecystitis (10/16): Cholecystostomy tube placed.  Cholecystectomy 6/17.  13. CKD stage 3 14. Anemia - Suspected GI bleed but EGD/c-scope 9/20 without significant findings.  - Capsule Endoscopy (11/20) without recent or active bleeding, no AVMs.  15. Syncope: Suspected cough syncope.  16. OSA  SH: Married, prior smoker.  Some ETOH, occasionally heavy in past.  Does use occasional marijuana. Out of work Chiropractor. 2 daughters in grad school.   FH: CAD  ROS: All systems reviewed and negative except as per HPI.   Current Outpatient Medications  Medication Sig Dispense Refill   ACCU-CHEK SOFTCLIX LANCETS lancets Use as instructed for three times daily testing of blood glucose 100 each 12   acetaminophen (TYLENOL) 500 MG tablet Take 1,000 mg by mouth every 6 (six) hours as needed (pain).     albuterol (VENTOLIN HFA) 108 (90 Base) MCG/ACT inhaler Inhale 1-2 puffs into the lungs every 6 (six) hours as needed for wheezing or shortness of breath. 8 g 0   apixaban (ELIQUIS) 5 MG TABS tablet Take 1 tablet (5 mg total) by mouth 2 (two) times daily. 60 tablet 3   atorvastatin (LIPITOR) 80 MG tablet Take 1 tablet (80 mg total) by mouth daily. 30 tablet 3   blood glucose meter kit and supplies KIT Dispense based on patient and insurance preference. Use up to four times daily as directed. (FOR ICD-9 250.00, 250.01). 1 each 0   Blood Glucose Monitoring Suppl (ACCU-CHEK AVIVA PLUS) w/Device KIT Use as directed for 3 times daily testing of blood glucose 1 kit 0   carvedilol (COREG) 12.5 MG tablet Take 1 tablet (12.5 mg total) by mouth 2 (two) times daily with a meal. 60 tablet 3   colchicine 0.6 MG tablet On day 1 take 2 tablets at first sign of gout flare.  Take 1 tablet 1 hour later.  Then take 1 tablet daily starting the next day until resolution of gout flare. 10 tablet 1   Continuous Blood Gluc Receiver (FREESTYLE LIBRE 2 READER)  DEVI Check your blood sugar three times per day before meals and once before bedtime 2 each 11   empagliflozin (JARDIANCE) 25 MG TABS tablet Take 1 tablet (25 mg total) by mouth daily before breakfast. 30 tablet 3   glucose blood (ACCU-CHEK AVIVA PLUS) test strip Use as instructed for 3 times daily testing of blood glucose 100 each 12   hydrALAZINE (APRESOLINE)  50 MG tablet Take 1.5 tablets (75 mg total) by mouth in the morning and at bedtime. 90 tablet 3   Insulin Aspart (NOVOLOG Gilman) Inject 20 Units into the skin 3 (three) times daily before meals.     insulin glargine (LANTUS) 100 UNIT/ML injection Inject 50 Units into the skin daily.     Insulin Pen Needle (PEN NEEDLES) 32G X 4 MM MISC 1 each by Does not apply route daily. 100 each 3   Lancet Devices (ACCU-CHEK SOFTCLIX) lancets Use as instructed for 3 times daily testing of blood glucose 1 each 0   metFORMIN (GLUCOPHAGE) 1000 MG tablet TAKE 1 TABLET BY MOUTH ONCE DAILY WITH BREAKFAST . APPOINTMENT REQUIRED FOR FUTURE REFILLS 30 tablet 2   nitroGLYCERIN (NITROSTAT) 0.4 MG SL tablet Place 1 tablet (0.4 mg total) under the tongue every 5 (five) minutes as needed for chest pain (x 3 tabs daily). 60 tablet 1   potassium chloride SA (KLOR-CON M) 20 MEQ tablet Take 1 tablet (20 mEq total) by mouth daily. 30 tablet 3   sotalol (BETAPACE) 120 MG tablet Take 1 tablet (120 mg total) by mouth 2 (two) times daily. 60 tablet 3   torsemide (DEMADEX) 20 MG tablet Take 80 mg by mouth daily.     TRUEPLUS LANCETS 28G MISC 1 each by Does not apply route 3 (three) times daily. 100 each 12   eplerenone (INSPRA) 50 MG tablet Take 1 tablet (50 mg total) by mouth daily. 90 tablet 3   insulin degludec (TRESIBA FLEXTOUCH) 200 UNIT/ML FlexTouch Pen 56 units daily (Patient not taking: Reported on 12/10/2022) 9 mL 1   isosorbide mononitrate (IMDUR) 120 MG 24 hr tablet Take 1 tablet (120 mg total) by mouth daily. 90 tablet 3   No current facility-administered medications for  this encounter.   BP 130/80   Pulse 62   Wt 92.1 kg (203 lb)   SpO2 97%   BMI 28.31 kg/m    Wt Readings from Last 3 Encounters:  12/10/22 92.1 kg (203 lb)  11/16/22 91.2 kg (201 lb)  11/14/22 90.9 kg (200 lb 6.4 oz)   PHYSICAL EXAM: General: NAD Neck: JVP 8 cm, no thyromegaly or thyroid nodule.  Lungs: Clear to auscultation bilaterally with normal respiratory effort. CV: Nondisplaced PMI.  Heart irregular S1/S2, no S3/S4, no murmur.  No peripheral edema.  No carotid bruit.  Normal pedal pulses.  Abdomen: Soft, nontender, no hepatosplenomegaly, no distention.  Skin: Intact without lesions or rashes.  Neurologic: Alert and oriented x 3.  Psych: Normal affect. Extremities: No clubbing or cyanosis.  HEENT: Normal.   Assessment/Plan: 1. Chronic diastolic CHF: EF 52% on cardiac MRI in 6/14 but EF improved back to 55-60% on 2/16 echo.  However, EF down to 26% on Cardiolite in 9/16.  Echo in 9/20 showed EF 60-65%, moderate LVH, normal RV, mild-moderate MR.  Echo in 5/21 with EF 55%, normal RV.  He has a Cardiomems device, but has not used in months.  Echo in 6/23 showed EF 55-60% with severe asymmetric septal hypertrophy.  Cardiac MRI (10/23) showed LVEF 47%, no LVOT, moderate to severe asymmetric hypertrophy of the basal to mid inferoseptal and anteroseptum, RVEF 46%, ECV 34%, non-coronary LGE pattern most consistent with hypertrophic CM. 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  LMNA mutations can cause a HCM phenotype.  NYHA class III symptoms, worse recently in setting of onset of atrial flutter most likely.  Suspect mild volume overload.  - Increase eplerenone to 50 mg daily.  BMET/BNP today, BMET in 10 days.  - Increase torsemide to 80 qam/40 qpm x 2 days then back to 80 mg daily.  - Continue empagliflozin 10 mg daily.  - Continue hydralazine 75 mg bid (does not take tid) + Imdur 120 mg daily. - Continue carvedilol 6.25 mg  bid. - Will refer to genetic evaluation with Dr. Sidney Ace given LMNA mutation that may be associated with HCM phenotype.  - I encouraged him to send Cardiomems at least 2-3 times/week, he says he will try.   2. Atrial fibrillation/flutter: Paroxysmal. He has had atrial flutter ablations x 2 remotely.  He is on sotalol. He tolerates atrial arrhythmias poorly, seems to worsen CHF.  Zio patch in 4/21 with 20% atrial flutter. He saw EP, repeat ablation was not recommended. Repeat Zio in 9/22 showed only NSR and PACs.  However, today he is back in atrial flutter with worsened CHF symptoms and mild volume overload.     - Continue sotalol, QTc acceptable.  - Continue Eliquis.  - I will arrange for TEE-DCCV later this week to get him back in NSR.  Will need TEE as he is not totally compliant with Eliquis dosing. We discussed risks/benefits and he agrees to procedure.  3. Coronary artery disease: LHC in 9/16 showed diffuse CAD, most significant involving the distal RCA.  Patient had DES x 2 to RCA.  Cath in 3/17 with patent RCA stents and diffuse distal and branch vessel disease concerning for poorly controlled diabetes.  Cath in 5/18 again showed diffuse CAD without good interventional target.  Cath in 11/20 showed severe distal and branch vessel disease but no interventional targets and not good anatomy for CABG.  He has had some atypical continuous chest heaviness that I think is related to CHF rather than ischemia.  - No ASA, given Eliquis use.  - Continue atorvastatin 80 mg daily, check lipids today. - Continue Coreg and Imdur daily for angina control.  - Avoid ranolazine with sotalol use.  4. Hyperlipidemia:  On atorvastatin, check lipids today.  5. Hypertension: BP controlled.  6. OSA: Having trouble with CPAP, needs to get in with sleep medicine.  7. CKD stable 3: BMET today.  8. DM: Per PCP.  9. ETOH use: Discussed limiting ETOH use. 10. Non-compliance: Doing better than in the past but still  too sporadic with his meds.   Follow up in 1 month with APP.   Marca Ancona,  12/10/2022

## 2022-12-11 ENCOUNTER — Telehealth (HOSPITAL_COMMUNITY): Payer: Self-pay | Admitting: Cardiology

## 2022-12-11 DIAGNOSIS — E7800 Pure hypercholesterolemia, unspecified: Secondary | ICD-10-CM

## 2022-12-11 MED ORDER — EZETIMIBE 10 MG PO TABS: 10.0000 mg | ORAL_TABLET | Freq: Every day | ORAL | 3 refills | Status: DC

## 2022-12-11 NOTE — Telephone Encounter (Signed)
-----   Message from [name], MD sent at 12/10/2022  1:58 PM EDT ----- LDL is too high. If he is taking atorvastatin 80 mg daily regularly, add Zetia 10 mg daily. Lipids/LFTs 2 months.

## 2022-12-11 NOTE — Telephone Encounter (Signed)
Patient called.  Patient aware.  

## 2022-12-12 ENCOUNTER — Telehealth: Payer: Self-pay | Admitting: *Deleted

## 2022-12-12 NOTE — Telephone Encounter (Signed)
Message sent to the scheduler to make patient an appointment.

## 2022-12-12 NOTE — Telephone Encounter (Signed)
-----   Message from Quintella Reichert, MD sent at 12/11/2022  6:31 PM EDT ----- Please get him in to see me ----- Message ----- From: [name], MD Sent: 12/10/2022   9:24 PM EDT To: Quintella Reichert, MD  Has a hard time tolerating CPAP.  Needs sleep medicine followup.

## 2022-12-13 ENCOUNTER — Telehealth (HOSPITAL_COMMUNITY): Payer: Self-pay

## 2022-12-13 NOTE — Telephone Encounter (Signed)
Spoke to patient about procedures scheduled for tomorrow. Aware of time,nothing to eat or drink  after midnight.Aware of insulin dose today and no insulin in am Aware of holding medications am of procedure Told to continue Eliquis

## 2022-12-13 NOTE — Pre-Procedure Instructions (Signed)
Unable to leave voicemail regarding TEE/cardioversion tomorrow due to full mailbox

## 2022-12-14 ENCOUNTER — Ambulatory Visit (HOSPITAL_BASED_OUTPATIENT_CLINIC_OR_DEPARTMENT_OTHER): Payer: 59 | Admitting: Anesthesiology

## 2022-12-14 ENCOUNTER — Other Ambulatory Visit: Payer: Self-pay

## 2022-12-14 ENCOUNTER — Encounter (HOSPITAL_COMMUNITY)
Admission: RE | Disposition: A | Discharge status: Home / Self Care | Payer: Self-pay | Source: Home / Self Care | Attending: Cardiology

## 2022-12-14 ENCOUNTER — Ambulatory Visit (HOSPITAL_COMMUNITY)
Admission: RE | Admit: 2022-12-14 | Discharge: 2022-12-14 | Disposition: A | Discharge status: Home / Self Care | Payer: 59 | Attending: Cardiology | Admitting: Cardiology

## 2022-12-14 ENCOUNTER — Ambulatory Visit (HOSPITAL_BASED_OUTPATIENT_CLINIC_OR_DEPARTMENT_OTHER): Payer: 59

## 2022-12-14 ENCOUNTER — Ambulatory Visit (HOSPITAL_COMMUNITY): Payer: 59 | Admitting: Anesthesiology

## 2022-12-14 DIAGNOSIS — E785 Hyperlipidemia, unspecified: Secondary | ICD-10-CM | POA: Diagnosis not present

## 2022-12-14 DIAGNOSIS — I480 Paroxysmal atrial fibrillation: Secondary | ICD-10-CM

## 2022-12-14 DIAGNOSIS — Z7984 Long term (current) use of oral hypoglycemic drugs: Secondary | ICD-10-CM | POA: Diagnosis not present

## 2022-12-14 DIAGNOSIS — F1090 Alcohol use, unspecified, uncomplicated: Secondary | ICD-10-CM | POA: Insufficient documentation

## 2022-12-14 DIAGNOSIS — I10 Essential (primary) hypertension: Secondary | ICD-10-CM

## 2022-12-14 DIAGNOSIS — I25119 Atherosclerotic heart disease of native coronary artery with unspecified angina pectoris: Secondary | ICD-10-CM | POA: Diagnosis not present

## 2022-12-14 DIAGNOSIS — Z79899 Other long term (current) drug therapy: Secondary | ICD-10-CM | POA: Insufficient documentation

## 2022-12-14 DIAGNOSIS — Z794 Long term (current) use of insulin: Secondary | ICD-10-CM | POA: Insufficient documentation

## 2022-12-14 DIAGNOSIS — E1122 Type 2 diabetes mellitus with diabetic chronic kidney disease: Secondary | ICD-10-CM | POA: Insufficient documentation

## 2022-12-14 DIAGNOSIS — I4892 Unspecified atrial flutter: Secondary | ICD-10-CM | POA: Diagnosis not present

## 2022-12-14 DIAGNOSIS — Z8679 Personal history of other diseases of the circulatory system: Secondary | ICD-10-CM

## 2022-12-14 DIAGNOSIS — I130 Hypertensive heart and chronic kidney disease with heart failure and stage 1 through stage 4 chronic kidney disease, or unspecified chronic kidney disease: Secondary | ICD-10-CM | POA: Diagnosis not present

## 2022-12-14 DIAGNOSIS — I4891 Unspecified atrial fibrillation: Secondary | ICD-10-CM | POA: Diagnosis not present

## 2022-12-14 DIAGNOSIS — I5032 Chronic diastolic (congestive) heart failure: Secondary | ICD-10-CM | POA: Insufficient documentation

## 2022-12-14 DIAGNOSIS — N183 Chronic kidney disease, stage 3 unspecified: Secondary | ICD-10-CM | POA: Diagnosis not present

## 2022-12-14 DIAGNOSIS — E119 Type 2 diabetes mellitus without complications: Secondary | ICD-10-CM | POA: Diagnosis not present

## 2022-12-14 DIAGNOSIS — Z955 Presence of coronary angioplasty implant and graft: Secondary | ICD-10-CM | POA: Diagnosis not present

## 2022-12-14 DIAGNOSIS — I513 Intracardiac thrombosis, not elsewhere classified: Secondary | ICD-10-CM | POA: Insufficient documentation

## 2022-12-14 DIAGNOSIS — Z87891 Personal history of nicotine dependence: Secondary | ICD-10-CM | POA: Diagnosis not present

## 2022-12-14 DIAGNOSIS — Z7901 Long term (current) use of anticoagulants: Secondary | ICD-10-CM | POA: Diagnosis not present

## 2022-12-14 DIAGNOSIS — G4733 Obstructive sleep apnea (adult) (pediatric): Secondary | ICD-10-CM | POA: Diagnosis not present

## 2022-12-14 HISTORY — PX: CARDIOVERSION: SHX1299

## 2022-12-14 HISTORY — PX: TEE WITHOUT CARDIOVERSION: SHX5443

## 2022-12-14 LAB — POCT I-STAT, CHEM 8
BUN: 34 mg/dL — ABNORMAL HIGH (ref 8–23)
Calcium, Ion: 1.28 mmol/L (ref 1.15–1.40)
Chloride: 100 mmol/L (ref 98–111)
Creatinine, Ser: 1.8 mg/dL — ABNORMAL HIGH (ref 0.61–1.24)
Glucose, Bld: 173 mg/dL — ABNORMAL HIGH (ref 70–99)
HCT: 61 % — ABNORMAL HIGH (ref 39.0–52.0)
Hemoglobin: 20.7 g/dL — ABNORMAL HIGH (ref 13.0–17.0)
Potassium: 3.3 mmol/L — ABNORMAL LOW (ref 3.5–5.1)
Sodium: 139 mmol/L (ref 135–145)
TCO2: 27 mmol/L (ref 22–32)

## 2022-12-14 LAB — GLUCOSE, CAPILLARY: Glucose-Capillary: 184 mg/dL — ABNORMAL HIGH (ref 70–99)

## 2022-12-14 LAB — ECHO TEE: Est EF: 50

## 2022-12-14 SURGERY — ECHOCARDIOGRAM, TRANSESOPHAGEAL
Anesthesia: Monitor Anesthesia Care

## 2022-12-14 MED ORDER — PROPOFOL 500 MG/50ML IV EMUL
INTRAVENOUS | Status: DC | PRN
  Administered 2022-12-14: 175 ug/kg/min via INTRAVENOUS

## 2022-12-14 MED ORDER — PROPOFOL 10 MG/ML IV BOLUS
INTRAVENOUS | Status: DC | PRN
  Administered 2022-12-14: 10 mg via INTRAVENOUS
  Administered 2022-12-14: 20 mg via INTRAVENOUS

## 2022-12-14 MED ORDER — APIXABAN 5 MG PO TABS
5.0000 mg | ORAL_TABLET | ORAL | Status: AC
  Administered 2022-12-14: 5 mg via ORAL
  Filled 2022-12-14: qty 1

## 2022-12-14 MED ORDER — SODIUM CHLORIDE 0.9 % IV SOLN: INTRAVENOUS | Status: DC

## 2022-12-14 SURGICAL SUPPLY — 1 items: ELECT DEFIB PAD ADLT CADENCE (PAD) ×2 IMPLANT

## 2022-12-14 NOTE — Discharge Instructions (Signed)
DO NOT MISS ELIQUIS!!!!!!  We will arrange a re-attempt at TEE and cardioversion in 1 month.

## 2022-12-14 NOTE — Anesthesia Preprocedure Evaluation (Signed)
Anesthesia Evaluation    Airway Mallampati: II  TM Distance: >3 FB Neck ROM: Full    Dental no notable dental hx.    Pulmonary sleep apnea , former smoker   Pulmonary exam normal breath sounds clear to auscultation       Cardiovascular hypertension, Normal cardiovascular exam+ dysrhythmias Atrial Fibrillation  Rhythm:Irregular Rate:Normal   1. Left ventricular ejection fraction, by estimation, is 55 to 60%. The  left ventricle has normal function. The left ventricle has no regional  wall motion abnormalities. There is severe asymmetric left ventricular  hypertrophy of the basal-septal  segment. Left ventricular diastolic parameters are indeterminate.   2. Right ventricular systolic function is normal. The right ventricular  size is normal. There is normal pulmonary artery systolic pressure. The  estimated right ventricular systolic pressure is 24.5 mmHg.   3. The mitral valve is normal in structure. Trivial mitral valve  regurgitation. No evidence of mitral stenosis.   4. The aortic valve is tricuspid. Aortic valve regurgitation is trivial.  Aortic valve sclerosis is present, with no evidence of aortic valve  stenosis.   5. The inferior vena cava is normal in size with greater than 50%  respiratory variability, suggesting right atrial pressure of 3 mmHg     Neuro/Psych    GI/Hepatic   Endo/Other  diabetes    Renal/GU      Musculoskeletal   Abdominal   Peds  Hematology  (+) HIV  Anesthesia Other Findings   Reproductive/Obstetrics                             Anesthesia Physical Anesthesia Plan  ASA: 3  Anesthesia Plan: MAC   Post-op Pain Management: Minimal or no pain anticipated   Induction: Intravenous  PONV Risk Score and Plan: 1 and Propofol infusion and Treatment may vary due to age or medical condition  Airway Management Planned: Simple Face Mask  Additional Equipment:    Intra-op Plan:   Post-operative Plan:   Informed Consent: I have reviewed the patients History and Physical, chart, labs and discussed the procedure including the risks, benefits and alternatives for the proposed anesthesia with the patient or authorized representative who has indicated his/her understanding and acceptance.     Dental advisory given  Plan Discussed with: CRNA and Surgeon  Anesthesia Plan Comments:        Anesthesia Quick Evaluation

## 2022-12-14 NOTE — Transfer of Care (Signed)
Immediate Anesthesia Transfer of Care Note  Patient: [name] [name]  Procedure(s) Performed: TRANSESOPHAGEAL ECHOCARDIOGRAM CARDIOVERSION  Patient Location: Cath Lab  Anesthesia Type:MAC  Level of Consciousness: drowsy and patient cooperative  Airway & Oxygen Therapy: Patient Spontanous Breathing and Patient connected to nasal cannula oxygen  Post-op Assessment: Report given to RN, Post -op Vital signs reviewed and stable, and Patient moving all extremities X 4  Post vital signs: Reviewed and stable  Last Vitals:  Vitals Value Taken Time  BP    Temp    Pulse    Resp    SpO2      Last Pain:  Vitals:   12/14/22 0659  TempSrc: Temporal  PainSc: 0-No pain         Complications: No notable events documented.

## 2022-12-14 NOTE — CV Procedure (Signed)
Procedure: TEE  Indication: Atrial fibrillation  Sedation: Per anesthesiology  Findings: Please see echo section for full report.  Normal left ventricular size with mild global hypokinesis, EF 50%.  Severe asymmetric hypertrophy of the basal to mid septum.  No LV outflow tract gradient or mitral valve systolic anterior motion.  Normal right ventricular size and systolic function.  The left atrium was mildly dilated, there was a thrombus noted in the LA appendage.  The right atrium was mildly dilated.  No PFO or ASD by color doppler.  Trivial TR.  Trivial MR.  Trileaflet aortic valve with no stenosis or regurgitation. Normal caliber thoracic aorta with mild plaque.   Impression:  Patient has LA appendage thrombus.  I think that he has been noncompliant with Eliquis dosing.  He needs to take Eliquis as ordered and we will re-attempt TEE-guided DCCV after 1 month of taking Eliquis as he is ordered to take it.  Will see if we can get paramedicine for him.   Marca Ancona 12/14/2022 7:50 AM

## 2022-12-14 NOTE — Interval H&P Note (Signed)
History and Physical Interval Note:  12/14/2022 7:30 AM  [name] [name]  has presented today for surgery, with the diagnosis of AFIB.  The various methods of treatment have been discussed with the patient and family. After consideration of risks, benefits and other options for treatment, the patient has consented to  Procedure(s): TRANSESOPHAGEAL ECHOCARDIOGRAM (N/A) CARDIOVERSION (N/A) as a surgical intervention.  The patient's history has been reviewed, patient examined, no change in status, stable for surgery.  I have reviewed the patient's chart and labs.  Questions were answered to the patient's satisfaction.     Julias Mould [employer]

## 2022-12-14 NOTE — Anesthesia Postprocedure Evaluation (Signed)
Anesthesia Post Note  Patient: Bliss Fessel  Procedure(s) Performed: TRANSESOPHAGEAL ECHOCARDIOGRAM CARDIOVERSION     Patient location during evaluation: PACU Anesthesia Type: MAC Level of consciousness: awake and alert Pain management: pain level controlled Vital Signs Assessment: post-procedure vital signs reviewed and stable Respiratory status: spontaneous breathing, nonlabored ventilation, respiratory function stable and patient connected to nasal cannula oxygen Cardiovascular status: stable and blood pressure returned to baseline Postop Assessment: no apparent nausea or vomiting Anesthetic complications: no  No notable events documented.  Last Vitals:  Vitals:   12/14/22 0820 12/14/22 0825  BP: (!) 167/97 (!) 166/102  Pulse: (!) 46 (!) 53  Resp: (!) 25 20  Temp:    SpO2: 95% 95%    Last Pain:  Vitals:   12/14/22 0808  TempSrc:   PainSc: 0-No pain                 Chin Wachter S

## 2022-12-17 ENCOUNTER — Encounter (HOSPITAL_COMMUNITY): Payer: Self-pay | Admitting: Cardiology

## 2022-12-19 ENCOUNTER — Telehealth (HOSPITAL_COMMUNITY): Payer: Self-pay | Admitting: Licensed Clinical Social Worker

## 2022-12-19 NOTE — Telephone Encounter (Signed)
CSW attempted to call pt to discuss re-enrollment in paramedicine program.  Unable to reach or leave VM.  Pt familiar with program and was agreeable during visit so CSW sending out referral to paramedics for assignment and follow up.  Burna Sis, LCSW Clinical Social Worker [facility] Desk#: [phone] Cell#: [phone]

## 2022-12-20 ENCOUNTER — Telehealth (HOSPITAL_COMMUNITY): Payer: Self-pay

## 2022-12-20 NOTE — Telephone Encounter (Signed)
Spoke to [name] [name] who agreed to paramedicine visits restarting. I made him aware I would be out of office until 5/22 and he understood and agreed with follow up that week. He knows if he needs something more urgent sooner to reach out to the HF team. Call complete.   Maralyn Sago, EMT-Paramedic [phone] 12/20/2022

## 2022-12-21 ENCOUNTER — Ambulatory Visit (HOSPITAL_COMMUNITY)
Admission: RE | Admit: 2022-12-21 | Discharge: 2022-12-21 | Disposition: A | Discharge status: Home / Self Care | Payer: 59 | Source: Ambulatory Visit | Attending: Internal Medicine | Admitting: Internal Medicine

## 2022-12-21 DIAGNOSIS — I5032 Chronic diastolic (congestive) heart failure: Secondary | ICD-10-CM | POA: Diagnosis not present

## 2022-12-21 LAB — BASIC METABOLIC PANEL
Anion gap: 12 (ref 5–15)
BUN: 36 mg/dL — ABNORMAL HIGH (ref 8–23)
CO2: 27 mmol/L (ref 22–32)
Calcium: 9.2 mg/dL (ref 8.9–10.3)
Chloride: 95 mmol/L — ABNORMAL LOW (ref 98–111)
Creatinine, Ser: 2.01 mg/dL — ABNORMAL HIGH (ref 0.61–1.24)
GFR, Estimated: 35 mL/min — ABNORMAL LOW (ref >60)
Glucose, Bld: 273 mg/dL — ABNORMAL HIGH (ref 70–99)
Potassium: 3.8 mmol/L (ref 3.5–5.1)
Sodium: 134 mmol/L — ABNORMAL LOW (ref 135–145)

## 2022-12-25 ENCOUNTER — Encounter (HOSPITAL_COMMUNITY): Payer: Self-pay

## 2022-12-25 ENCOUNTER — Telehealth: Payer: Self-pay

## 2022-12-25 NOTE — Progress Notes (Signed)
Patient ID: [name] [name], male   DOB: 08/17/1955, 67 y.o.   MRN: [id number]  Care Management & Coordination Services Pharmacy Team  Reason for Encounter: Diabetes  Contacted patient to discuss diabetes disease state. Unsuccessful outreach. Left voicemail for patient to return call.  Current antihyperglycemic regimen:  Jardiance 25mg  1 qd  Tresiba 200 units Inject 54 units a day at noon  Humalog Inject 20 units at each meal and 25 for starchy meals Metformin 1000mg  1 tab daily with breakfast    Recent office visits:  11/16/22 [name] [name] - Patient presented for Type 2 diabetes mellitus with hyperglycemia with long term current use of insulin and other concerns. No Medication changes.   Recent consult visits:  12/10/22 [name] Morale, MD (Cardiology) - Patient presented for Chronic diastolic CHF and other concerns. Increased Eplerenone.   12/05/22 [name] Priest, RN (Nutrition) - Patient presented for Poorly controlled type 2 diabetes mellitus with complication. No medication changes.   11/14/22 [name] Littler, MD (Endo) - Patient presented for Uncontrolled type 2 diabetes mellitus with hyperglycemia with long term current use of insulin and other concerns.  Patient Instructions  [name] [name] 54 units a day at noon ,Humalog 20 units at each meal and 25 for starchy meals  Hospital visits:  Medication Reconciliation was completed by comparing discharge summary, patient's EMR and Pharmacy list, and upon discussion with patient.  Patient presented to [facility] on 12/14/22 due to Paroxysmal A-fib. Patient was present for 2 hours.  New?Medications Started at [facility] Discharge:?? -started  none  Medication Changes at Hospital Discharge: -Changed  none  Medications Discontinued at Hospital Discharge: -Stopped  none  Medications that remain the same after Hospital Discharge:??  -All other medications will remain the same.    Medications: Outpatient Encounter  Medications as of 12/25/2022  Medication Sig   ACCU-CHEK SOFTCLIX LANCETS lancets Use as instructed for three times daily testing of blood glucose   acetaminophen (TYLENOL) 500 MG tablet Take 1,000 mg by mouth every 6 (six) hours as needed (pain).   albuterol (VENTOLIN HFA) 108 (90 Base) MCG/ACT inhaler Inhale 1-2 puffs into the lungs every 6 (six) hours as needed for wheezing or shortness of breath.   apixaban (ELIQUIS) 5 MG TABS tablet Take 1 tablet (5 mg total) by mouth 2 (two) times daily.   atorvastatin (LIPITOR) 80 MG tablet Take 1 tablet (80 mg total) by mouth daily.   blood glucose meter kit and supplies KIT Dispense based on patient and insurance preference. Use up to four times daily as directed. (FOR ICD-9 250.00, 250.01).   Blood Glucose Monitoring Suppl (ACCU-CHEK AVIVA PLUS) w/Device KIT Use as directed for 3 times daily testing of blood glucose   carvedilol (COREG) 12.5 MG tablet Take 1 tablet (12.5 mg total) by mouth 2 (two) times daily with a meal.   colchicine 0.6 MG tablet On day 1 take 2 tablets at first sign of gout flare.  Take 1 tablet 1 hour later.  Then take 1 tablet daily starting the next day until resolution of gout flare.   Continuous Blood Gluc Receiver (FREESTYLE LIBRE 2 READER) DEVI Check your blood sugar three times per day before meals and once before bedtime   empagliflozin (JARDIANCE) 25 MG TABS tablet Take 1 tablet (25 mg total) by mouth daily before breakfast.   eplerenone (INSPRA) 50 MG tablet Take 1 tablet (50 mg total) by mouth daily.   ezetimibe (ZETIA) 10 MG tablet Take 1 tablet (10 mg  total) by mouth daily.   glucose blood (ACCU-CHEK AVIVA PLUS) test strip Use as instructed for 3 times daily testing of blood glucose   hydrALAZINE (APRESOLINE) 50 MG tablet Take 1.5 tablets (75 mg total) by mouth in the morning and at bedtime.   Insulin Aspart (NOVOLOG [place]) Inject 20 Units into the skin 3 (three) times daily before meals.   insulin degludec (TRESIBA  FLEXTOUCH) 200 UNIT/ML FlexTouch Pen 56 units daily   insulin glargine (LANTUS) 100 UNIT/ML injection Inject 50 Units into the skin daily.   Insulin Pen Needle (PEN NEEDLES) 32G X 4 MM MISC 1 each by Does not apply route daily.   isosorbide mononitrate (IMDUR) 120 MG 24 hr tablet Take 1 tablet (120 mg total) by mouth daily.   Lancet Devices (ACCU-CHEK SOFTCLIX) lancets Use as instructed for 3 times daily testing of blood glucose   metFORMIN (GLUCOPHAGE) 1000 MG tablet TAKE 1 TABLET BY MOUTH ONCE DAILY WITH BREAKFAST . APPOINTMENT REQUIRED FOR FUTURE REFILLS   nitroGLYCERIN (NITROSTAT) 0.4 MG SL tablet Place 1 tablet (0.4 mg total) under the tongue every 5 (five) minutes as needed for chest pain (x 3 tabs daily).   potassium chloride SA (KLOR-CON M) 20 MEQ tablet Take 1 tablet (20 mEq total) by mouth daily.   sotalol (BETAPACE) 120 MG tablet Take 1 tablet (120 mg total) by mouth 2 (two) times daily.   torsemide (DEMADEX) 20 MG tablet Take 80 mg by mouth daily.   TRUEPLUS LANCETS 28G MISC 1 each by Does not apply route 3 (three) times daily.   No facility-administered encounter medications on file as of 12/25/2022.    Recent Relevant Labs: Lab Results  Component Value Date/Time   HGBA1C 10.7 (A) 11/14/2022 11:52 AM   HGBA1C 11.8 (H) 09/10/2022 10:46 AM   HGBA1C 10.5 (H) 05/07/2022 09:54 AM   HGBA1C 9.2 (A) 09/29/2018 10:59 AM   MICROALBUR 20.1 (H) 05/07/2022 09:54 AM   MICROALBUR 1.7 07/20/2020 02:26 PM    Kidney Function Lab Results  Component Value Date/Time   CREATININE 2.01 (H) 12/21/2022 10:36 AM   CREATININE 1.80 (H) 12/14/2022 06:46 AM   CREATININE 1.34 (H) 07/20/2020 02:16 PM   CREATININE 1.20 08/03/2016 11:05 AM   GFR 42.73 (L) 09/10/2022 10:46 AM   GFRNONAA 35 (L) 12/21/2022 10:36 AM   GFRNONAA 55 (L) 07/20/2020 02:16 PM   GFRAA 64 07/20/2020 02:16 PM    Star Rating Drugs:  Atorvastatin 80 mg - Last filled 07/28/22 90 Ds at [facility] Jardiance 25 mg - Last filled 11/19/22  75 DS at [facility] Metformin 1000 mg - Last filled 11/30/22 62 DS at [facility]   Care Gaps: Zoster Vaccine - Overdue Eye Exam - Overdue COVID Booster - Overdue PNA Vaccine Postponed AWV - 07/23/22  Pamala Duffel CMA Clinical Pharmacist Assistant [phone]

## 2022-12-26 DIAGNOSIS — E119 Type 2 diabetes mellitus without complications: Secondary | ICD-10-CM | POA: Diagnosis not present

## 2023-01-02 ENCOUNTER — Telehealth (HOSPITAL_COMMUNITY): Payer: Self-pay

## 2023-01-02 NOTE — Telephone Encounter (Signed)
Spoke to [name] [name] to attempt to schedule paramedicine visit for tomorrow and he reports he is unavailable this week but agreed to meet at HF clinic visit next week at 1000. I will meet him there, I reminded him to bring all medications to appointment. He agreed. Call complete.   Maralyn Sago, EMT-Paramedic [phone] 01/02/2023

## 2023-01-08 ENCOUNTER — Telehealth (HOSPITAL_COMMUNITY): Payer: Self-pay

## 2023-01-08 NOTE — Progress Notes (Incomplete)
Patient ID: [name] [name], male   DOB: 02-05-1956, 67 y.o.   MRN: [id number] PCP: Deeann Saint, MD HF Cardiology: Dr. Shirlee Latch  67 y.o. with history of HTN, diabetes, paroxysmal atrial fibrillation, and CAD presents for cardiology followup.  He was admitted in 5/13 with chest pain with exertion.  LHC was done showing global HK with EF 45% and diffuse, severe distal and branch vessel disease.  There was no interventional option, but it was suspect that this disease could be causing his anginal-type pain.  Echo at that time was read as showing EF 60-65%.  He was admitted in 5/14 with hypertensive urgency and chest pain.  He ruled out for MI and BP was controlled.  His EF was 50-55% by echo.  ETT-Sestamibi done as outpatient showed small fixed inferior defect with no ischemia but EF was 31%.  He was quite hypertensive during the study.  Cardiac MRI done to confirm EF in 5/14 showed EF 44%.  He had an episode of atrial fibrillation/RVR in 3/15 but went back into NSR.  Echo in 2/16 showed EF 55-60% with moderate diastolic dysfunction.   He had a Cardiolite in 9/16 showing inferior ischemia.  He was having exertional dyspnea with less activity than normal.  There was concern for worsened CAD and he was sent for [facility] in 9/16. By RHC, filling pressures were not significantly elevated. He had a severe distal RCA stenosis that was treated with DES x 2.  Echo showed EF 55-60%.    In 10/16, he was admitted with acute cholecystitis.  He received a cholecystostomy tube.  Cholecystectomy done in 6/17.   Atrial fibrillation in 1/17, cardioverted to NSR.   Recurrent concerning chest pain in 3/17.  Repeat LHC showed diffuse distal and branch vessel disease consistent with poorly-controlled DM, but patent RCA stents.    He was admitted with atrial fibrillation in 12/17. Started on sotalol and converted to NSR.    He had recurrent chest pain in 5/18 with abnormal Cardiolite.  Cath was done again in 5/18,  showing diffuse CAD with no good interventional options.   Admitted 11/20 with increased dyspnea and exertional chest pain. Hgb went down to 8.1. GI consulted.  Had LHC that showed severe distal and branch vessel disease but no interventional targets and not good anatomy for CABG. Continued on medical management. Capsule endoscopy without recent or active bleeding, no AVMs. He went into atrial flutter during his hospitalization. He went back into NSR, however, after discharge.  +Snoring and daytime sleepiness.   He has had episodes of syncope associated with vigorous coughing.   He was in the ER with atrial flutter and dyspnea in 4/21. Zio monitor in 9/22 showed NSR with PACs.   Follow up 1/23 not compliant with medications or Cardiomems.   Follow up 5/23, continued to miss medications, not using Cardiomems. Mild volume overload, instructed to restart meds.  Evaluated in ED 01/14/22 for cough, found to be COVID +. Received molnupiravir.  Admitted 01/22/22 with CP. Cards consulted, troponin negative x 2, Echo showed EF 55-60%, severe asymmetric LV hypertrophy, normal RV. No new WMA and CP resolved, so LHC deferred.  Coreg decreased to 12.5 bid due to low BP and Mobitz Type 1. Discharged home, weight 200 lbs.   Seen in ED 10/23 after a fall, ETOH level 176. CT head negative.   cMRI 10/23 concerning for hypertrophic cardiomyopathy.  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  LMNA mutations can cause a HCM phenotype.   Scheduled for TEE/DCCV 12/14/22. LA appendage thrombus seen. Needs to be compliant with eliquis for 1 month prior to re attempting TEE/DCCV  Today he returns for post hospital follow up. Overall feeling ***. Denies palpitations, CP, dizziness, edema, or PND/Orthopnea. *** SOB. Appetite ok. No fever or chills. Weight at home *** pounds. Taking all medications. ETOH, smoking *** . CPAP?*** Cardiomems ***   ECG (personally  reviewed): ***  Cardiomems: No readings x months.  Labs (11/20): K 3.5, creatinine 1.33, LDL 25 Labs (4/21): K 4.0, SCr 1.14, BNP 266  Labs (6/21): K 3.7, creatinine 1.25 Labs (12/21): LDL 25 Labs (1/22): BNP 132 Labs (2/22): K 3.4, creatinine 1.44 Labs (8/22): LDL 81 Labs (9/22): K 3.2, creatinine 1.53 Labs (10/22): K 3.7, creatinine 1.39 Labs (11/22): K 4.4, creatinine 1.47 Labs (3/23): K 4.4, creatinine 1.44 Labs (5/23): K 4.2, creatinine 1.78 Labs (6/23): K 4.1, creatinine 1.24, hgb 15.8 Labs (7/23): K 4.1, creatinine 1.55 Labs (10/23): K 3.7, creatinine 1.51 Labs (1/24): K 3.7, creatinine 1.64  PMH: 1. DM2 2. Gout 3. HTN: Cough with ACEI.  4. Cardiomyopathy: Suspect mixed ischemic/nonischemic (?ETOH-related).  EF 35% in 2008.  Echo in 5/13 showed EF 60-65% with moderate LVH but EF was 45% on LV-gram in 5/13.  Echo (5/14) with EF 50-55%, mild LVH, inferobasal HK, mild MR.  ETT-Sestamibi in 5/14, however, showed EF 31%.  Cardiac MRI (6/14) with EF 44%, mild global hypokinesis, subepicardial delayed enhancement in a nonspecific RV insertion site pattern.  Echo (2/16) with EF 55-60%, moderate diastolic dysfunction. RHC (9/16) with mean RA 8, PA 39/20 mean 28, mean PCWP 14, CI 2.2. Echo (9/16) with EF 55-60%, grade II diastolic dysfunction.  - Echo (6/17) with EF 55-60%, moderate LVH, grade II diastolic dysfunction, mild AI, mild MR.  - RHC (11/20): mean RA 4, PA 46/14 mean 26, mean PCWP 13, CI 3.23 - Echo (9/20): EF 60-65%, moderate LVH, normal RV size and systolic function, mild-moderate MR, mild AI.  - Echo (5/21): EF 55%, RV normal - Has Cardiomems.  - Echo (6/23): EF 55-60%, severe asymmetric LV hypertrophy, normal RV. - cMRI (11/23): LVEF 47%, no LVOT, moderate to severe asymmetric hypertrophy of the basal to mid inferoseptal and anteroseptum, RVEF 46%, ECV 34%, non-coronary LGE pattern most consistent with hypertrophic CM 5. CAD: LHC in 2010 with mild nonobstructive disease.   LHC (5/13) with diffuse distal and branch vessel disease, mild global hypokinesis and EF 45%.  ETT-Sestamibi (5/14): EF 31%, small fixed inferior defect with no ischemia.  Lexiscan Cardiolite (9/16) with EF 26%, moderate inferior defect that was partially reversible, suggesting ischemia => High risk study. LHC (9/16) with 40-50% mLAD, diffuse up to 50% distal LAD, 90% ostium of branch off ramus, 95% dRCA => DES to RCA x 2, EF 55-60%.   - LHC (3/17) with diffuse branch and distal vessel disease c/w poorly controlled DM, RCA stents patent.  - LHC (5/18): Diffuse CAD with no good interventional option. 60% mRCA, 90% dLAD, 50% serial mid ramus stenoses, ramus branches with ostial 80-90% stenoses. - LHC (11/20): Severe distal and branch vessel disease but no interventional targets and not good anatomy for CABG  6. H/o chronic HBV 7. Osteochondroma left shoulder.  8. Hyperlipidemia 9. Paroxysmal atrial fibrillation: Coumadin.  Developed cough and increased ESR with amiodarone.  DCCV in 1/17.  Recurrent atrial fibrillation in 12/17, sotalol started.  10. Atrial flutter: had ablations in  2007 and 2008.  - Zio patch in 4/21 with 20% atrial flutter.  11. OSA on CPAP.  12. Acute cholecystitis (10/16): Cholecystostomy tube placed.  Cholecystectomy 6/17.  13. CKD stage 3 14. Anemia - Suspected GI bleed but EGD/c-scope 9/20 without significant findings.  - Capsule Endoscopy (11/20) without recent or active bleeding, no AVMs.  15. Syncope: Suspected cough syncope.  16. OSA  SH: Married, prior smoker.  Some ETOH, occasionally heavy in past.  Does use occasional marijuana. Out of work Chiropractor. 2 daughters in grad school.   FH: CAD  ROS: All systems reviewed and negative except as per HPI.   Current Outpatient Medications  Medication Sig Dispense Refill   ACCU-CHEK SOFTCLIX LANCETS lancets Use as instructed for three times daily testing of blood glucose 100 each 12   acetaminophen (TYLENOL) 500 MG  tablet Take 1,000 mg by mouth every 6 (six) hours as needed (pain).     albuterol (VENTOLIN HFA) 108 (90 Base) MCG/ACT inhaler Inhale 1-2 puffs into the lungs every 6 (six) hours as needed for wheezing or shortness of breath. 8 g 0   apixaban (ELIQUIS) 5 MG TABS tablet Take 1 tablet (5 mg total) by mouth 2 (two) times daily. 60 tablet 3   atorvastatin (LIPITOR) 80 MG tablet Take 1 tablet (80 mg total) by mouth daily. 30 tablet 3   blood glucose meter kit and supplies KIT Dispense based on patient and insurance preference. Use up to four times daily as directed. (FOR ICD-9 250.00, 250.01). 1 each 0   Blood Glucose Monitoring Suppl (ACCU-CHEK AVIVA PLUS) w/Device KIT Use as directed for 3 times daily testing of blood glucose 1 kit 0   carvedilol (COREG) 12.5 MG tablet Take 1 tablet (12.5 mg total) by mouth 2 (two) times daily with a meal. 60 tablet 3   colchicine 0.6 MG tablet On day 1 take 2 tablets at first sign of gout flare.  Take 1 tablet 1 hour later.  Then take 1 tablet daily starting the next day until resolution of gout flare. 10 tablet 1   Continuous Blood Gluc Receiver (FREESTYLE LIBRE 2 READER) DEVI Check your blood sugar three times per day before meals and once before bedtime 2 each 11   empagliflozin (JARDIANCE) 25 MG TABS tablet Take 1 tablet (25 mg total) by mouth daily before breakfast. 30 tablet 3   eplerenone (INSPRA) 50 MG tablet Take 1 tablet (50 mg total) by mouth daily. 90 tablet 3   ezetimibe (ZETIA) 10 MG tablet Take 1 tablet (10 mg total) by mouth daily. 90 tablet 3   glucose blood (ACCU-CHEK AVIVA PLUS) test strip Use as instructed for 3 times daily testing of blood glucose 100 each 12   hydrALAZINE (APRESOLINE) 50 MG tablet Take 1.5 tablets (75 mg total) by mouth in the morning and at bedtime. 90 tablet 3   Insulin Aspart (NOVOLOG [place]) Inject 20 Units into the skin 3 (three) times daily before meals.     insulin degludec (TRESIBA FLEXTOUCH) 200 UNIT/ML FlexTouch Pen 56 units  daily 9 mL 1   insulin glargine (LANTUS) 100 UNIT/ML injection Inject 50 Units into the skin daily.     Insulin Pen Needle (PEN NEEDLES) 32G X 4 MM MISC 1 each by Does not apply route daily. 100 each 3   isosorbide mononitrate (IMDUR) 120 MG 24 hr tablet Take 1 tablet (120 mg total) by mouth daily. 90 tablet 3   Lancet Devices (ACCU-CHEK SOFTCLIX) lancets Use as  instructed for 3 times daily testing of blood glucose 1 each 0   metFORMIN (GLUCOPHAGE) 1000 MG tablet TAKE 1 TABLET BY MOUTH ONCE DAILY WITH BREAKFAST . APPOINTMENT REQUIRED FOR FUTURE REFILLS 30 tablet 2   nitroGLYCERIN (NITROSTAT) 0.4 MG SL tablet Place 1 tablet (0.4 mg total) under the tongue every 5 (five) minutes as needed for chest pain (x 3 tabs daily). 60 tablet 1   potassium chloride SA (KLOR-CON M) 20 MEQ tablet Take 1 tablet (20 mEq total) by mouth daily. 30 tablet 3   sotalol (BETAPACE) 120 MG tablet Take 1 tablet (120 mg total) by mouth 2 (two) times daily. 60 tablet 3   torsemide (DEMADEX) 20 MG tablet Take 80 mg by mouth daily.     TRUEPLUS LANCETS 28G MISC 1 each by Does not apply route 3 (three) times daily. 100 each 12   No current facility-administered medications for this visit.   There were no vitals taken for this visit.   Wt Readings from Last 3 Encounters:  12/10/22 92.1 kg (203 lb)  11/16/22 91.2 kg (201 lb)  11/14/22 90.9 kg (200 lb 6.4 oz)   PHYSICAL EXAM: General:  *** appearing.  No respiratory difficulty HEENT: normal Neck: supple. JVD *** cm. Carotids 2+ bilat; no bruits. No lymphadenopathy or thyromegaly appreciated. Cor: PMI nondisplaced. Regular rate & rhythm. No rubs, gallops or murmurs. Lungs: clear Abdomen: soft, nontender, nondistended. No hepatosplenomegaly. No bruits or masses. Good bowel sounds. Extremities: no cyanosis, clubbing, rash, edema  Neuro: alert & oriented x 3, cranial nerves grossly intact. moves all 4 extremities w/o difficulty. Affect pleasant.   Assessment/Plan: 1.  Chronic diastolic CHF: EF 40% on cardiac MRI in 6/14 but EF improved back to 55-60% on 2/16 echo.  However, EF down to 26% on Cardiolite in 9/16.  Echo in 9/20 showed EF 60-65%, moderate LVH, normal RV, mild-moderate MR.  Echo in 5/21 with EF 55%, normal RV.  He has a Cardiomems device, but has not used in months.  Echo in 6/23 showed EF 55-60% with severe asymmetric septal hypertrophy.  Cardiac MRI (10/23) showed LVEF 47%, no LVOT, moderate to severe asymmetric hypertrophy of the basal to mid inferoseptal and anteroseptum, RVEF 46%, ECV 34%, non-coronary LGE pattern most consistent with hypertrophic CM. 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  LMNA mutations can cause a HCM phenotype.  NYHA class III symptoms, worse recently in setting of onset of atrial flutter most likely. Suspect mild volume overload.  - Increase eplerenone to 50 mg daily.  BMET/BNP today, BMET in 10 days.  - Increase torsemide to 80 qam/40 qpm x 2 days then back to 80 mg daily.  - Continue empagliflozin 10 mg daily.  - Continue hydralazine 75 mg bid (does not take tid) + Imdur 120 mg daily. - Continue carvedilol 6.25 mg bid. - Will refer to genetic evaluation with Dr. Sidney Ace given LMNA mutation that may be associated with HCM phenotype.  - I encouraged him to send Cardiomems at least 2-3 times/week, he says he will try.   2. Atrial fibrillation/flutter: Paroxysmal. He has had atrial flutter ablations x 2 remotely.  He is on sotalol. He tolerates atrial arrhythmias poorly, seems to worsen CHF.  Zio patch in 4/21 with 20% atrial flutter. He saw EP, repeat ablation was not recommended. Repeat Zio in 9/22 showed only NSR and PACs.  However, today he is back in atrial flutter with worsened  CHF symptoms and mild volume overload.     - Continue sotalol, QTc acceptable.  - Continue Eliquis.  - I will arrange for TEE-DCCV later this week to get him back in NSR.  Will need  TEE as he is not totally compliant with Eliquis dosing. We discussed risks/benefits and he agrees to procedure.  3. Coronary artery disease: LHC in 9/16 showed diffuse CAD, most significant involving the distal RCA.  Patient had DES x 2 to RCA.  Cath in 3/17 with patent RCA stents and diffuse distal and branch vessel disease concerning for poorly controlled diabetes.  Cath in 5/18 again showed diffuse CAD without good interventional target.  Cath in 11/20 showed severe distal and branch vessel disease but no interventional targets and not good anatomy for CABG.  He has had some atypical continuous chest heaviness that I think is related to CHF rather than ischemia.  - No ASA, given Eliquis use.  - Continue atorvastatin 80 mg daily, check lipids today. - Continue Coreg and Imdur daily for angina control.  - Avoid ranolazine with sotalol use.  4. Hyperlipidemia:  On atorvastatin, check lipids today.  5. Hypertension: BP controlled.  6. OSA: Having trouble with CPAP, needs to get in with sleep medicine.  7. CKD stable 3: BMET today.  8. DM: Per PCP.  9. ETOH use: Discussed limiting ETOH use. 10. Non-compliance: Doing better than in the past but still too sporadic with his meds.   Follow up in 1 month with APP.   Alen Bleacher, AGACNP-BC  01/08/2023

## 2023-01-08 NOTE — Telephone Encounter (Signed)
Called and was unable to leave patient a voice message  to confirm/remind patient of their appointment at the [facility] on 01/09/23.     

## 2023-01-09 ENCOUNTER — Ambulatory Visit (HOSPITAL_COMMUNITY)
Admission: RE | Admit: 2023-01-09 | Discharge: 2023-01-09 | Disposition: A | Discharge status: Home / Self Care | Payer: 59 | Source: Ambulatory Visit | Attending: Family Medicine | Admitting: Family Medicine

## 2023-01-09 ENCOUNTER — Encounter (HOSPITAL_COMMUNITY): Payer: Self-pay

## 2023-01-09 ENCOUNTER — Telehealth (HOSPITAL_COMMUNITY): Payer: Self-pay | Admitting: Cardiology

## 2023-01-09 ENCOUNTER — Other Ambulatory Visit (HOSPITAL_COMMUNITY): Payer: Self-pay

## 2023-01-09 VITALS — BP 120/72 | HR 62 | Wt 199.0 lb

## 2023-01-09 DIAGNOSIS — I4892 Unspecified atrial flutter: Secondary | ICD-10-CM | POA: Insufficient documentation

## 2023-01-09 DIAGNOSIS — I4819 Other persistent atrial fibrillation: Secondary | ICD-10-CM

## 2023-01-09 DIAGNOSIS — Z7984 Long term (current) use of oral hypoglycemic drugs: Secondary | ICD-10-CM | POA: Insufficient documentation

## 2023-01-09 DIAGNOSIS — Z91199 Patient's noncompliance with other medical treatment and regimen due to unspecified reason: Secondary | ICD-10-CM

## 2023-01-09 DIAGNOSIS — I480 Paroxysmal atrial fibrillation: Secondary | ICD-10-CM | POA: Insufficient documentation

## 2023-01-09 DIAGNOSIS — Z87891 Personal history of nicotine dependence: Secondary | ICD-10-CM | POA: Diagnosis not present

## 2023-01-09 DIAGNOSIS — I10 Essential (primary) hypertension: Secondary | ICD-10-CM | POA: Diagnosis not present

## 2023-01-09 DIAGNOSIS — N183 Chronic kidney disease, stage 3 unspecified: Secondary | ICD-10-CM | POA: Diagnosis not present

## 2023-01-09 DIAGNOSIS — E1122 Type 2 diabetes mellitus with diabetic chronic kidney disease: Secondary | ICD-10-CM | POA: Diagnosis not present

## 2023-01-09 DIAGNOSIS — Z7901 Long term (current) use of anticoagulants: Secondary | ICD-10-CM | POA: Insufficient documentation

## 2023-01-09 DIAGNOSIS — Z8249 Family history of ischemic heart disease and other diseases of the circulatory system: Secondary | ICD-10-CM | POA: Diagnosis not present

## 2023-01-09 DIAGNOSIS — Q998 Other specified chromosome abnormalities: Secondary | ICD-10-CM

## 2023-01-09 DIAGNOSIS — I130 Hypertensive heart and chronic kidney disease with heart failure and stage 1 through stage 4 chronic kidney disease, or unspecified chronic kidney disease: Secondary | ICD-10-CM | POA: Insufficient documentation

## 2023-01-09 DIAGNOSIS — I2510 Atherosclerotic heart disease of native coronary artery without angina pectoris: Secondary | ICD-10-CM | POA: Diagnosis not present

## 2023-01-09 DIAGNOSIS — Z794 Long term (current) use of insulin: Secondary | ICD-10-CM | POA: Diagnosis not present

## 2023-01-09 DIAGNOSIS — F1010 Alcohol abuse, uncomplicated: Secondary | ICD-10-CM

## 2023-01-09 DIAGNOSIS — Z955 Presence of coronary angioplasty implant and graft: Secondary | ICD-10-CM | POA: Diagnosis not present

## 2023-01-09 DIAGNOSIS — E118 Type 2 diabetes mellitus with unspecified complications: Secondary | ICD-10-CM | POA: Diagnosis not present

## 2023-01-09 DIAGNOSIS — G4733 Obstructive sleep apnea (adult) (pediatric): Secondary | ICD-10-CM | POA: Diagnosis not present

## 2023-01-09 DIAGNOSIS — E785 Hyperlipidemia, unspecified: Secondary | ICD-10-CM | POA: Diagnosis not present

## 2023-01-09 DIAGNOSIS — Z91148 Patient's other noncompliance with medication regimen for other reason: Secondary | ICD-10-CM | POA: Diagnosis not present

## 2023-01-09 DIAGNOSIS — I5032 Chronic diastolic (congestive) heart failure: Secondary | ICD-10-CM | POA: Diagnosis not present

## 2023-01-09 DIAGNOSIS — Z79899 Other long term (current) drug therapy: Secondary | ICD-10-CM | POA: Diagnosis not present

## 2023-01-09 LAB — LIPID PANEL
Cholesterol: 138 mg/dL (ref 0–200)
HDL: 44 mg/dL (ref >40)
LDL Cholesterol: 66 mg/dL (ref 0–99)
Total CHOL/HDL Ratio: 3.1 RATIO
Triglycerides: 138 mg/dL (ref <150)
VLDL: 28 mg/dL (ref 0–40)

## 2023-01-09 LAB — BASIC METABOLIC PANEL
Anion gap: 12 (ref 5–15)
BUN: 22 mg/dL (ref 8–23)
CO2: 23 mmol/L (ref 22–32)
Calcium: 9.5 mg/dL (ref 8.9–10.3)
Chloride: 102 mmol/L (ref 98–111)
Creatinine, Ser: 1.68 mg/dL — ABNORMAL HIGH (ref 0.61–1.24)
GFR, Estimated: 44 mL/min — ABNORMAL LOW (ref >60)
Glucose, Bld: 231 mg/dL — ABNORMAL HIGH (ref 70–99)
Potassium: 4.1 mmol/L (ref 3.5–5.1)
Sodium: 137 mmol/L (ref 135–145)

## 2023-01-09 LAB — BRAIN NATRIURETIC PEPTIDE: B Natriuretic Peptide: 275.5 pg/mL — ABNORMAL HIGH (ref 0.0–100.0)

## 2023-01-09 MED ORDER — HYDRALAZINE HCL 50 MG PO TABS
50.0000 mg | ORAL_TABLET | Freq: Two times a day (BID) | ORAL | 6 refills | Status: DC
Start: 2023-01-09 — End: 2023-01-09

## 2023-01-09 MED ORDER — HYDRALAZINE HCL 50 MG PO TABS
50.0000 mg | ORAL_TABLET | Freq: Two times a day (BID) | ORAL | 6 refills | Status: DC
Start: 2023-01-09 — End: 2023-07-29

## 2023-01-09 MED ORDER — TORSEMIDE 20 MG PO TABS: 40.0000 mg | ORAL_TABLET | Freq: Two times a day (BID) | ORAL | 6 refills | Status: DC

## 2023-01-09 NOTE — Patient Instructions (Signed)
DECREASE Hydralazine to 50 mg twice a day DECREASE Torsemide to 40 mg twice a day  Labs today We will only contact you if something comes back abnormal or we need to make some changes. Otherwise no news is good news!  You have been referred to [facility] with Dr Mendel Corning -they will be in touch to schedule an appointment for sleep apnea  You have been referred to [facility] with Dr Doristine Locks -they will be in touch with a appointment   Your physician recommends that you schedule a follow-up appointment in: 6-8 weeks with Dr Shirlee Latch  Do the following things EVERYDAY: Weigh yourself in the morning before breakfast. Write it down and keep it in a log. Take your medicines as prescribed Eat low salt foods--Limit salt (sodium) to 2000 mg per day.  Stay as active as you can everyday Limit all fluids for the day to less than 2 liters  At the [facility], you and your health needs are our priority. As part of our continuing mission to provide you with exceptional heart care, we have created designated Provider Care Teams. These Care Teams include your primary Cardiologist (physician) and Advanced Practice Providers (APPs- Physician Assistants and Nurse Practitioners) who all work together to provide you with the care you need, when you need it.   You may see any of the following providers on your designated Care Team at your next follow up: Dr Arvilla Meres Dr Marca Ancona Dr. Marcos Eke, NP Robbie Lis, [place], [place] [name], NP Karle Plumber, PharmD   Please be sure to bring in all your medications bottles to every appointment.    Thank you for choosing [facility]

## 2023-01-09 NOTE — Telephone Encounter (Signed)
Per Alma Diaz,NP Pt will need TEE/DCCV in 2 weeks with Dr Shirlee Latch RE: A Flutter  Attempted to contact patient to review availability (MWF preferred)  No answer unable to leave message  Herbert Seta with para medicine reports he is to call with second phone # to assist with scheduling procedure

## 2023-01-09 NOTE — Telephone Encounter (Signed)
[phone] [facility]

## 2023-01-09 NOTE — Addendum Note (Signed)
Encounter addended by: Alen Bleacher, NP on: 01/09/2023 2:00 PM  Actions taken: Clinical Note Signed

## 2023-01-09 NOTE — Progress Notes (Signed)
Paramedicine Encounter  Patient ID: [name] [name], male, DOB: October 17, 1955, 67 y.o.,  MRN: [id number]  Met patient in clinic today with [name] Peon, NP.  [name] [name] reports feeling dizzy and just not well today. He reports he has been trying to take his medications as prescribed however I note bottles dated back to April 30th and they are still full. He says he takes them out of the bottles. He is missing some bottles-  Metformin, Sotalol, Potassium but claims he has them at home. I called [facility] and they said they filled them on 5/19 and they aren't due for refill until 6/19. He also claims he has been using Novolog and Lantus at home instead of the recently prescribed tresiba and humalog. I advised him to throw out the old insulin and begin using the newest prescribed ones- he agreed with plan. I filled a pill box with what we had today in clinic- I made a note of what's missing and he plans to look at home for those bottles and call me updating me if he has them or not.   Labs obtained today.  EKG obtained today.   He needs an appointment with [facility], [facility] and follow up with Dr. Mayford Knife and Genetic testing. Referrals sent via [employer] today for the last two listed providers. I will assist and follow up.   He is interested in a life alert- I will follow up.   CBG- 214  I switched his freestyle out today for him.    I plan to see [name] [name] in one week in the home. He agreed with plan.    Weight @ clinic-199lbs  B/P- 120/72 P-62 SP02-97%   Med changes:  Torsemide 80mg  daily (40mg  AM/ 40mg  PM)  Hydralazine 50mg  AM/PM       [name] [name], EMT-Paramedic [phone] 01/09/2023

## 2023-01-11 ENCOUNTER — Telehealth (HOSPITAL_COMMUNITY): Payer: Self-pay

## 2023-01-11 ENCOUNTER — Encounter (HOSPITAL_COMMUNITY): Payer: Self-pay

## 2023-01-11 DIAGNOSIS — I5032 Chronic diastolic (congestive) heart failure: Secondary | ICD-10-CM

## 2023-01-11 NOTE — Telephone Encounter (Signed)
Patient called back. Patient advised and verbalized understanding,lab appointment scheduled,lab orders entered

## 2023-01-11 NOTE — Telephone Encounter (Signed)
-----   Message from Alen Bleacher, NP sent at 01/09/2023  1:59 PM EDT ----- Labs stable. Cut back Torsemide today but BNP slightly higher. Needs repeat BMET/BNP 7-10 days

## 2023-01-14 ENCOUNTER — Other Ambulatory Visit: Payer: 59

## 2023-01-14 NOTE — Telephone Encounter (Signed)
LMOM -message to Herbert Seta to assist scheduling (paramedicine)

## 2023-01-15 ENCOUNTER — Telehealth (HOSPITAL_COMMUNITY): Payer: Self-pay

## 2023-01-15 NOTE — Telephone Encounter (Signed)
Left message for [name] [name] to call me back for scheduling his upcoming procedure and our home paramedicine visit.   Maralyn Sago, EMT-Paramedic [phone] 01/15/2023

## 2023-01-16 ENCOUNTER — Ambulatory Visit (INDEPENDENT_AMBULATORY_CARE_PROVIDER_SITE_OTHER): Payer: 59 | Admitting: "Endocrinology

## 2023-01-16 ENCOUNTER — Encounter: Payer: Self-pay | Admitting: "Endocrinology

## 2023-01-16 VITALS — BP 130/80 | HR 59 | Ht 71.0 in | Wt 199.2 lb

## 2023-01-16 DIAGNOSIS — Z7984 Long term (current) use of oral hypoglycemic drugs: Secondary | ICD-10-CM

## 2023-01-16 DIAGNOSIS — E7800 Pure hypercholesterolemia, unspecified: Secondary | ICD-10-CM | POA: Diagnosis not present

## 2023-01-16 DIAGNOSIS — E1165 Type 2 diabetes mellitus with hyperglycemia: Secondary | ICD-10-CM | POA: Diagnosis not present

## 2023-01-16 DIAGNOSIS — Z794 Long term (current) use of insulin: Secondary | ICD-10-CM | POA: Diagnosis not present

## 2023-01-16 LAB — POCT GLYCOSYLATED HEMOGLOBIN (HGB A1C): Hemoglobin A1C: 10.3 % — AB (ref 4.0–5.6)

## 2023-01-16 MED ORDER — INSULIN GLARGINE 100 UNIT/ML ~~LOC~~ SOLN: 50.0000 [IU] | Freq: Every day | SUBCUTANEOUS | 1 refills | Status: DC

## 2023-01-16 MED ORDER — FREESTYLE LIBRE 3 SENSOR MISC: 1.0000 | 0 refills | Status: DC

## 2023-01-16 MED ORDER — METFORMIN HCL 1000 MG PO TABS: ORAL_TABLET | ORAL | 1 refills | Status: DC

## 2023-01-16 NOTE — Progress Notes (Signed)
Outpatient Endocrinology Note Altames[place], MD  01/16/23   [name] [name] [birth year] [id number]  Referring Provider: Deeann Saint, MD Primary Care Provider: Deeann Saint, MD Reason for consultation: Subjective   Assessment & Plan  Diagnoses and all orders for this visit:  Uncontrolled type 2 diabetes mellitus with hyperglycemia, with long-term current use of insulin (HCC) -     POCT glycosylated hemoglobin (Hb A1C)  Long-term insulin use (HCC)  Long term (current) use of oral hypoglycemic drugs  Pure hypercholesterolemia  Other orders -     insulin glargine (LANTUS) 100 UNIT/ML injection; Inject 0.5 mLs (50 Units total) into the skin daily. -     metFORMIN (GLUCOPHAGE) 1000 MG tablet; TAKE 1 TABLET BY MOUTH ONCE DAILY WITH BREAKFAST . APPOINTMENT REQUIRED FOR FUTURE REFILLS -     Continuous Glucose Sensor (FREESTYLE LIBRE 3 SENSOR) MISC; 1 Device by Does not apply route continuous.    Diabetes complicated by hyperglycemia,  Hba1c goal less than 7.0, current Hba1c is 10.3. Will recommend to continue: Jardiance 25 mg daily, metformin 1 g daily Lantus 50 units qhs, Humalog 20 units at break fast 15 min before meals Asked pt to swipe at least 5 times a day and bring receiver Ordered libre 3 so need for swiping is eliminated  No known contraindications to any of above medications Pt plans to make appt with eye and foot doctor  Hyperlipidemia -Last LDL near goal: 87 -on atorvastatin 80 mg QD -Follow low fat diet and exercise    -Blood pressure goal <140/90 - Microalbumin/creatinine at goal < 30 - not on ACE/ARB  -diet changes including salt restriction -limit eating outside -counseled BP targets per standards of diabetes care -Uncontrolled blood pressure can lead to retinopathy, nephropathy and cardiovascular and atherosclerotic heart disease  Reviewed and counseled on: -A1C target -Blood sugar targets -Complications of uncontrolled  diabetes  -Checking blood sugar before meals and bedtime and bring log next visit -All medications with mechanism of action and side effects -Hypoglycemia management: rule of 15's, Glucagon Emergency Kit and medical alert ID -low-carb low-fat plate-method diet -At least 20 minutes of physical activity per day -Annual dilated retinal eye exam and foot exam -compliance and follow up needs -follow up as scheduled or earlier if problem gets worse  Call if blood sugar is less than 70 or consistently above 250    Take a 15 gm snack of carbohydrate at bedtime before you go to sleep if your blood sugar is less than 100.    If you are going to fast after midnight for a test or procedure, ask your physician for instructions on how to reduce/decrease your insulin dose.    Call if blood sugar is less than 70 or consistently above 250  -Treating a low sugar by rule of 15  (15 gms of sugar every 15 min until sugar is more than 70) If you feel your sugar is low, test your sugar to be sure If your sugar is low (less than 70), then take 15 grams of a fast acting Carbohydrate (3-4 glucose tablets or glucose gel or 4 ounces of juice or regular soda) Recheck your sugar 15 min after treating low to make sure it is more than 70 If sugar is still less than 70, treat again with 15 grams of carbohydrate          Don't drive the hour of hypoglycemia  If unconscious/unable to eat or drink by mouth, use glucagon  injection or nasal spray baqsimi and call 911. Can repeat again in 15 min if still unconscious.  Return in about 2 weeks (around 01/30/2023) for visit at 9:40.   I have reviewed current medications, nurse's notes, allergies, vital signs, past medical and surgical history, family medical history, and social history for this encounter. Counseled patient on symptoms, examination findings, lab findings, imaging results, treatment decisions and monitoring and prognosis. The patient understood the recommendations  and agrees with the treatment plan. All questions regarding treatment plan were fully answered.  Altamese [place], MD  01/16/23    History of Present Illness [name] [name] is a 67 y.o. year old male who presents for evaluation of Type 2 diabetes mellitus.  [name] [name] was first diagnosed in 2010.   Diabetes education +  Home diabetes regimen: Non-insulin hypoglycemic drugs: Jardiance 25 mg daily, metformin 1 g daily Insulin regimen: Lantus 50 units qhs, Humalog 20 units at break fast 15 min before meals  Previous history:  Non-insulin hypoglycemic drugs previously used: Metformin, Jardiance Insulin was started in 2013  COMPLICATIONS -  MI/Stroke -  retinopathy +  neuropathy +  nephropathy  BLOOD SUGAR DATA Did not bring receiver Recalls 135-350 range  Physical Exam  BP 130/80   Pulse (!) 59   Ht 5\' 11"  (1.803 m)   Wt 199 lb 3.2 oz (90.4 kg)   SpO2 97%   BMI 27.78 kg/m    Constitutional: well developed, well nourished Head: normocephalic, atraumatic Eyes: sclera anicteric, no redness Neck: supple Lungs: normal respiratory effort Neurology: alert and oriented Skin: dry, no appreciable rashes Musculoskeletal: no appreciable defects Psychiatric: normal mood and affect Diabetic Foot Exam - Simple   No data filed      Current Medications Patient's Medications  New Prescriptions   CONTINUOUS GLUCOSE SENSOR (FREESTYLE LIBRE 3 SENSOR) MISC    1 Device by Does not apply route continuous.  Previous Medications   ACCU-CHEK SOFTCLIX LANCETS LANCETS    Use as instructed for three times daily testing of blood glucose   ACETAMINOPHEN (TYLENOL) 500 MG TABLET    Take 1,000 mg by mouth every 6 (six) hours as needed (pain).   ALBUTEROL (VENTOLIN HFA) 108 (90 BASE) MCG/ACT INHALER    Inhale 1-2 puffs into the lungs every 6 (six) hours as needed for wheezing or shortness of breath.   APIXABAN (ELIQUIS) 5 MG TABS TABLET    Take 1 tablet (5 mg total) by mouth 2  (two) times daily.   ATORVASTATIN (LIPITOR) 80 MG TABLET    Take 1 tablet (80 mg total) by mouth daily.   BLOOD GLUCOSE METER KIT AND SUPPLIES KIT    Dispense based on patient and insurance preference. Use up to four times daily as directed. (FOR ICD-9 250.00, 250.01).   BLOOD GLUCOSE MONITORING SUPPL (ACCU-CHEK AVIVA PLUS) W/DEVICE KIT    Use as directed for 3 times daily testing of blood glucose   CARVEDILOL (COREG) 25 MG TABLET    Take 12.5 mg by mouth 2 (two) times daily with a meal.   COLCHICINE 0.6 MG TABLET    On day 1 take 2 tablets at first sign of gout flare.  Take 1 tablet 1 hour later.  Then take 1 tablet daily starting the next day until resolution of gout flare.   CONTINUOUS BLOOD GLUC RECEIVER (FREESTYLE LIBRE 2 READER) DEVI    Check your blood sugar three times per day before meals and once before bedtime   EMPAGLIFLOZIN (JARDIANCE)  25 MG TABS TABLET    Take 1 tablet (25 mg total) by mouth daily before breakfast.   EPLERENONE (INSPRA) 50 MG TABLET    Take 1 tablet (50 mg total) by mouth daily.   GLUCOSE BLOOD (ACCU-CHEK AVIVA PLUS) TEST STRIP    Use as instructed for 3 times daily testing of blood glucose   HYDRALAZINE (APRESOLINE) 50 MG TABLET    Take 1 tablet (50 mg total) by mouth in the morning and at bedtime.   INSULIN ASPART (NOVOLOG [place])    Inject 20 Units into the skin 3 (three) times daily before meals. As needed   INSULIN DEGLUDEC (TRESIBA FLEXTOUCH) 200 UNIT/ML FLEXTOUCH PEN    56 units daily   INSULIN PEN NEEDLE (PEN NEEDLES) 32G X 4 MM MISC    1 each by Does not apply route daily.   ISOSORBIDE MONONITRATE (IMDUR) 120 MG 24 HR TABLET    Take 1 tablet (120 mg total) by mouth daily.   LANCET DEVICES (ACCU-CHEK SOFTCLIX) LANCETS    Use as instructed for 3 times daily testing of blood glucose   NITROGLYCERIN (NITROSTAT) 0.4 MG SL TABLET    Place 1 tablet (0.4 mg total) under the tongue every 5 (five) minutes as needed for chest pain (x 3 tabs daily).   SOTALOL (BETAPACE) 120  MG TABLET    Take 1 tablet (120 mg total) by mouth 2 (two) times daily.   TORSEMIDE (DEMADEX) 20 MG TABLET    Take 2 tablets (40 mg total) by mouth 2 (two) times daily.   TRUEPLUS LANCETS 28G MISC    1 each by Does not apply route 3 (three) times daily.  Modified Medications   Modified Medication Previous Medication   INSULIN GLARGINE (LANTUS) 100 UNIT/ML INJECTION insulin glargine (LANTUS) 100 UNIT/ML injection      Inject 0.5 mLs (50 Units total) into the skin daily.    Inject 50 Units into the skin daily.   METFORMIN (GLUCOPHAGE) 1000 MG TABLET metFORMIN (GLUCOPHAGE) 1000 MG tablet      TAKE 1 TABLET BY MOUTH ONCE DAILY WITH BREAKFAST . APPOINTMENT REQUIRED FOR FUTURE REFILLS    TAKE 1 TABLET BY MOUTH ONCE DAILY WITH BREAKFAST . APPOINTMENT REQUIRED FOR FUTURE REFILLS  Discontinued Medications   No medications on file    Allergies Allergies  Allergen Reactions   Shrimp [Shellfish Allergy] Nausea And Vomiting   Ace Inhibitors Cough   Other Hives and Other (See Comments)    Patient reports developing hives after receiving "some antibiotic given in 1980''s at [facility]". He does not know which antibiotic.    Past Medical History Past Medical History:  Diagnosis Date   Anxiety    CAD (coronary artery disease), native coronary artery    a. Nonobstructive CAD by cath 2013 - diffuse distal and branch vessel CAD, no severe disease in the major coronaries, LV mild global hypokinesis, EF 45%. b. ETT-Sestamibi 5/14: EF 31%, small fixed inferior defect with no ischemia.   Chronic CHF (HCC)    a. Mixed ICM/NICM (?EtOH). EF 35% in 2008. Echo 5/13: EF 60-65%, mod LVH, EF 45% on V gram in 12/2011. EF 12/2012: EF 50-55%, mild LVH, inferobasal HK, mild MR. ETT-Ses 5/14 EF 41%. Cardiac MRI 5/14: EF 44%, mild global HK, subepicardial delayed enhancement in nonspecific RV insertion pattern.   COLONIC POLYPS, HX OF 12/30/2006   Gout    H/O atrial flutter 08/13/2005   a. Ablations in 2007,  2008.   Heart  murmur    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   "left shoulder"   PAF (paroxysmal atrial fibrillation) (HCC)    On coumadin   Sleep apnea    "suppose to send mask but they never did" (05/03/2015)   Type II diabetes mellitus [facility])     Past Surgical History Past Surgical History:  Procedure Laterality Date   A FLUTTER ABLATION  2007, 2008   catheter ablation    CARDIAC CATHETERIZATION N/A 05/03/2015   Procedure: Right/Left Heart Cath and Coronary Angiography;  Surgeon: [name], MD;  Location: [facility] INVASIVE CV LAB;  Service: Cardiovascular;  Laterality: N/A;   CARDIAC CATHETERIZATION N/A 05/03/2015   Procedure: Coronary Stent Intervention;  Surgeon: Corky Crafts, MD;  Location: [facility] INVASIVE CV LAB;  Service: Cardiovascular;  Laterality: N/A;   CARDIAC CATHETERIZATION N/A 11/04/2015   Procedure: Left Heart Cath and Coronary Angiography;  Surgeon: [name], MD;  Location: [facility] INVASIVE CV LAB;  Service: Cardiovascular;  Laterality: N/A;   CARDIOVERSION N/A 08/24/2015   Procedure: CARDIOVERSION;  Surgeon: Vesta Mixer, MD;  Location: [facility] ENDOSCOPY;  Service: Cardiovascular;  Laterality: N/A;   CARDIOVERSION N/A 12/14/2022   Procedure: CARDIOVERSION;  Surgeon: [name], MD;  Location: [facility] INVASIVE CV LAB;  Service: Cardiovascular;  Laterality: N/A;   CHOLECYSTECTOMY N/A 02/07/2016   Procedure: LAPAROSCOPIC CHOLECYSTECTOMY WITH  INTRAOPERATIVE CHOLANGIOGRAM;  Surgeon: Gaynelle Adu, MD;  Location: [facility] ORS;  Service: General;  Laterality: N/A;   COLONOSCOPY WITH PROPOFOL N/A 04/28/2019   Procedure: COLONOSCOPY WITH PROPOFOL;  Surgeon:  Kerin Salen, MD;  Location[facility] ENDOSCOPY;  Service: Gastroenterology;  Laterality: N/A;   CORONARY ANGIOPLASTY  12/05/01   ESOPHAGOGASTRODUODENOSCOPY (EGD) WITH PROPOFOL N/A 04/27/2019   Procedure: ESOPHAGOGASTRODUODENOSCOPY (EGD) WITH PROPOFOL;  Surgeon: Kerin Salen, MD;  Location: [facility] ENDOSCOPY;  Service: Gastroenterology;  Laterality: N/A;   GIVENS CAPSULE STUDY N/A 06/29/2019   Procedure: GIVENS CAPSULE STUDY;  Surgeon: Charlott Rakes, MD;  Location: [facility] ENDOSCOPY;  Service: Endoscopy;  Laterality: N/A;   LEFT HEART CATH AND CORONARY ANGIOGRAPHY N/A 12/28/2016   Procedure: Left Heart Cath and Coronary Angiography;  Surgeon: [name], MD;  Location: [facility] INVASIVE CV LAB;  Service: Cardiovascular;  Laterality: N/A;   LEFT HEART CATHETERIZATION WITH CORONARY ANGIOGRAM N/A 12/19/2011   Procedure: LEFT HEART CATHETERIZATION WITH CORONARY ANGIOGRAM;  Surgeon: [name], MD;  Location: [facility] CATH LAB;  Service: Cardiovascular;  Laterality: N/A;   OSTEOCHONDROMA EXCISION Left 1972   "took bone tumor off my shoulder"   POLYPECTOMY  04/28/2019   Procedure: POLYPECTOMY;  Surgeon: Kerin Salen, MD;  Location: [facility] ENDOSCOPY;  Service: Gastroenterology;;   PRESSURE SENSOR/CARDIOMEMS N/A 02/01/2020   Procedure: PRESSURE SENSOR/CARDIOMEMS;  Surgeon: [name], MD;  Location: [facility] INVASIVE CV LAB;  Service: Cardiovascular;  Laterality: N/A;   RIGHT HEART CATH N/A 02/01/2020   Procedure: RIGHT HEART CATH;  Surgeon: [name], MD;  Location: [facility] INVASIVE CV LAB;  Service: Cardiovascular;  Laterality: N/A;   RIGHT/LEFT HEART CATH AND CORONARY ANGIOGRAPHY N/A 07/01/2019   Procedure: RIGHT/LEFT HEART CATH AND CORONARY ANGIOGRAPHY;  Surgeon: [name], MD;  Location: [facility] INVASIVE CV LAB;  Service: Cardiovascular;  Laterality: N/A;   TEE WITHOUT CARDIOVERSION N/A 12/14/2022   Procedure: TRANSESOPHAGEAL ECHOCARDIOGRAM;  Surgeon: [name], MD;  Location: [facility] INVASIVE CV LAB;  Service:  Cardiovascular;  Laterality: N/A;    Family History family history includes Diabetes in his mother; Hypertension in his mother.  Social History Social History   Socioeconomic History   Marital status: Widowed    Spouse name: Not on file   Number of children: 2   Years of education: 14   Highest education level: Some college, no degree  Occupational History    Employer: UNEMPLOYED  Tobacco Use   Smoking status: Former    Packs/day: 4.00    Years: 18.00    Additional pack years: 0.00    Total pack years: 72.00    Types: Cigarettes    Quit date: 08/14/1983    Years since quitting: 39.4   Smokeless tobacco: Never  Vaping Use   Vaping Use: Never used  Substance and Sexual Activity   Alcohol use: Yes    Alcohol/week: 16.0 standard drinks of alcohol    Types: 6 Cans of beer, 10 Shots of liquor per week    Comment: 2-3 beers watching sports; occasional glass of liquor; drinks heavily in football season   Drug use: Yes    Types: Marijuana    Comment: twice a week   Sexual activity: Never  Other Topics Concern   Not on file  Social History Narrative   Not on file   Social Determinants of Health   Financial Resource Strain: Low Risk  (07/23/2022)   Overall Financial Resource Strain (CARDIA)    Difficulty of Paying Living Expenses: Not hard at all  Food Insecurity: No Food Insecurity (11/05/2022)   Hunger Vital Sign    Worried About Running Out of Food in the Last Year: Never true    Ran Out of Food in the Last Year: Never true  Transportation Needs: No Transportation Needs (07/23/2022)   PRAPARE - Administrator, Civil Service (Medical): No    Lack of Transportation (Non-Medical): No  Physical Activity: Inactive (07/23/2022)   Exercise Vital Sign    Days of Exercise per Week: 0 days    Minutes of Exercise per Session: 0 min  Stress: No Stress Concern Present (07/23/2022)   [employer] of Occupational Health - Occupational Stress Questionnaire     Feeling of Stress : Not at all  Social Connections: Socially Isolated (07/23/2022)   Social Connection and Isolation Panel [NHANES]    Frequency of Communication with Friends and Family: More than three times a week    Frequency of Social Gatherings with Friends and Family: More than three times a week    Attends Religious Services: Never    Database administrator or Organizations: No    Attends Banker Meetings: Never    Marital Status: Widowed  Intimate Partner Violence: Not At Risk (07/23/2022)   Humiliation, Afraid, Rape, and Kick questionnaire    Fear of Current or Ex-Partner: No    Emotionally Abused: No    Physically Abused: No    Sexually Abused: No    Lab Results  Component Value Date   HGBA1C 10.7 (A) 11/14/2022   HGBA1C 11.8 (H) 09/10/2022   HGBA1C 10.5 (H) 05/07/2022   Lab Results  Component Value Date   CHOL 138 01/09/2023   Lab Results  Component Value Date   HDL 44 01/09/2023  Lab Results  Component Value Date   LDLCALC 66 01/09/2023   Lab Results  Component Value Date   TRIG 138 01/09/2023   Lab Results  Component Value Date   CHOLHDL 3.1 01/09/2023   Lab Results  Component Value Date   CREATININE 1.68 (H) 01/09/2023   Lab Results  Component Value Date   GFR 42.73 (L) 09/10/2022   Lab Results  Component Value Date   MICROALBUR 20.1 (H) 05/07/2022      Component Value Date/Time   NA 137 01/09/2023 1056   NA 140 04/08/2018 1102   K 4.1 01/09/2023 1056   CL 102 01/09/2023 1056   CO2 23 01/09/2023 1056   GLUCOSE 231 (H) 01/09/2023 1056   BUN 22 01/09/2023 1056   BUN 9 04/08/2018 1102   CREATININE 1.68 (H) 01/09/2023 1056   CREATININE 1.34 (H) 07/20/2020 1416   CALCIUM 9.5 01/09/2023 1056   PROT 7.7 06/02/2022 0015   PROT 7.1 04/08/2018 1102   ALBUMIN 3.6 06/02/2022 0015   ALBUMIN 3.9 04/08/2018 1102   AST 38 06/02/2022 0015   ALT 23 06/02/2022 0015   ALKPHOS 70 06/02/2022 0015   BILITOT 0.5 06/02/2022 0015   BILITOT  0.4 04/08/2018 1102   GFRNONAA 44 (L) 01/09/2023 1056   GFRNONAA 55 (L) 07/20/2020 1416   GFRAA 64 07/20/2020 1416      Latest Ref Rng & Units 01/09/2023   10:56 AM 12/21/2022   10:36 AM 12/14/2022    6:46 AM  BMP  Glucose 70 - 99 mg/dL 161  096  045   BUN 8 - 23 mg/dL 22  36  34   Creatinine 0.61 - 1.24 mg/dL 4.09  8.11  9.14   Sodium 135 - 145 mmol/L 137  134  139   Potassium 3.5 - 5.1 mmol/L 4.1  3.8  3.3   Chloride 98 - 111 mmol/L 102  95  100   CO2 22 - 32 mmol/L 23  27    Calcium 8.9 - 10.3 mg/dL 9.5  9.2         Component Value Date/Time   WBC 6.7 06/02/2022 0015   RBC 6.48 (H) 06/02/2022 0015   HGB 20.7 (H) 12/14/2022 0646   HGB 14.0 07/29/2017 1101   HCT 61.0 (H) 12/14/2022 0646   HCT 44.2 07/29/2017 1101   PLT 134 (L) 06/02/2022 0015   PLT 187 07/29/2017 1101   MCV 84.7 06/02/2022 0015   MCV 83 07/29/2017 1101   MCH 27.9 06/02/2022 0015   MCHC 33.0 06/02/2022 0015   RDW 17.4 (H) 06/02/2022 0015   RDW 16.8 (H) 07/29/2017 1101   LYMPHSABS 3.3 06/02/2022 0015   LYMPHSABS 2.2 07/29/2017 1101   MONOABS 0.9 06/02/2022 0015   EOSABS 0.3 06/02/2022 0015   EOSABS 0.4 07/29/2017 1101   BASOSABS 0.1 06/02/2022 0015   BASOSABS 0.1 07/29/2017 1101     Parts of this note may have been dictated using voice recognition software. There may be variances in spelling and vocabulary which are unintentional. Not all errors are proofread. Please notify the Thereasa Parkin if any discrepancies are noted or if the meaning of any statement is not clear.

## 2023-01-17 ENCOUNTER — Telehealth (HOSPITAL_COMMUNITY): Payer: Self-pay

## 2023-01-17 NOTE — Telephone Encounter (Signed)
Attempt X4 to reach Mr. Brackenbury for paramedicine outreach. No answer, left voicemail.   Maralyn Sago, EMT-Paramedic [phone] 01/17/2023

## 2023-01-18 ENCOUNTER — Ambulatory Visit (HOSPITAL_COMMUNITY): Admission: RE | Admit: 2023-01-18 | Payer: 59 | Source: Home / Self Care | Admitting: Cardiology

## 2023-01-18 ENCOUNTER — Encounter (HOSPITAL_COMMUNITY): Admission: RE | Payer: Self-pay | Source: Home / Self Care

## 2023-01-18 SURGERY — ECHOCARDIOGRAM, TRANSESOPHAGEAL
Anesthesia: General

## 2023-01-21 ENCOUNTER — Telehealth (HOSPITAL_COMMUNITY): Payer: Self-pay

## 2023-01-21 ENCOUNTER — Other Ambulatory Visit (HOSPITAL_COMMUNITY): Payer: Self-pay | Admitting: Internal Medicine

## 2023-01-21 ENCOUNTER — Ambulatory Visit (HOSPITAL_COMMUNITY)
Admission: RE | Admit: 2023-01-21 | Discharge: 2023-01-21 | Disposition: A | Discharge status: Home / Self Care | Payer: 59 | Source: Ambulatory Visit | Attending: Internal Medicine | Admitting: Internal Medicine

## 2023-01-21 DIAGNOSIS — R42 Dizziness and giddiness: Secondary | ICD-10-CM | POA: Diagnosis not present

## 2023-01-21 DIAGNOSIS — I5032 Chronic diastolic (congestive) heart failure: Secondary | ICD-10-CM | POA: Diagnosis not present

## 2023-01-21 DIAGNOSIS — R140 Abdominal distension (gaseous): Secondary | ICD-10-CM | POA: Diagnosis not present

## 2023-01-21 DIAGNOSIS — R110 Nausea: Secondary | ICD-10-CM | POA: Diagnosis not present

## 2023-01-21 DIAGNOSIS — G8929 Other chronic pain: Secondary | ICD-10-CM | POA: Diagnosis not present

## 2023-01-21 DIAGNOSIS — R109 Unspecified abdominal pain: Secondary | ICD-10-CM | POA: Diagnosis not present

## 2023-01-21 DIAGNOSIS — Z6823 Body mass index (BMI) 23.0-23.9, adult: Secondary | ICD-10-CM | POA: Diagnosis not present

## 2023-01-21 LAB — BASIC METABOLIC PANEL
Anion gap: 11 (ref 5–15)
BUN: 18 mg/dL (ref 8–23)
CO2: 25 mmol/L (ref 22–32)
Calcium: 8.9 mg/dL (ref 8.9–10.3)
Chloride: 99 mmol/L (ref 98–111)
Creatinine, Ser: 1.63 mg/dL — ABNORMAL HIGH (ref 0.61–1.24)
GFR, Estimated: 46 mL/min — ABNORMAL LOW (ref >60)
Glucose, Bld: 184 mg/dL — ABNORMAL HIGH (ref 70–99)
Potassium: 3.5 mmol/L (ref 3.5–5.1)
Sodium: 135 mmol/L (ref 135–145)

## 2023-01-21 LAB — BRAIN NATRIURETIC PEPTIDE: B Natriuretic Peptide: 138.6 pg/mL — ABNORMAL HIGH (ref 0.0–100.0)

## 2023-01-21 NOTE — Telephone Encounter (Signed)
Attempted to reach Mr. Shankland for a home paramedicine visit on both numbers in his chart. No answer at each and unable to leave a message on one cell and I left one on the other.  I will continue to attempt.   Maralyn Sago, EMT-Paramedic [phone] 01/21/2023

## 2023-01-23 ENCOUNTER — Telehealth: Payer: Self-pay

## 2023-01-23 ENCOUNTER — Other Ambulatory Visit: Payer: Self-pay | Admitting: Endocrinology

## 2023-01-23 DIAGNOSIS — E1165 Type 2 diabetes mellitus with hyperglycemia: Secondary | ICD-10-CM

## 2023-01-23 NOTE — Progress Notes (Deleted)
Patient ID: [name] [name], male   DOB: March 10, 1956, 67 y.o.   MRN: [id number]  Care Management & Coordination Services Pharmacy Team  Reason for Encounter: Medication coordination and delivery  Contacted patient to discuss medications and coordinate delivery from [facility]. {US HC Outreach:28874} Cycle dispensing form sent to *** for review.    Patient is due for next adherence delivery on: 02/04/23  This delivery to include: Adherence Packaging  30 Days  Atorvastatin 80 mg : Take one tab at lunch Carvedilol 12.5 mg : Take one tab at Breakfast and one at [employer] Eliquis 5 mg : Take one tab at Breakfast and one at [employer] 25 mg : Take one tab at Lunch Eplerenone 25 mg : Take one tab at Lunch Hydralazine 50 mg : Take one tab at Breakfast and one at Dinner Isosorbide 120 mg ER : Take one tab at Lunch Metformin 1000 mg : Take one tab at Breakfast Potassium 20 meq: Take one tab at Lunch Sotalol 120 mg : Take one tab at Breakfast and one at [employer] Torsemide 20 mg: Take one Tab at Breakfast and one at [employer]  *Albuterol *Colchicine 0.6 mg *Lantus *Humalog *Nitroglycerin 0.4 mg   Patient declined the following medications this month: ***  {refills needed:25320}  {Delivery WUJW:11914}   Any concerns about your medications? {yes/no:20286}  How often do you forget or accidentally miss a dose? {Missed doses:25554}  Do you use a pillbox? {yes/no:20286}  Is patient in packaging {yes/no:20286}  If yes  What is the date on your next pill pack?  Any concerns or issues with your packaging?   Recent blood pressure readings are as follows:***  Recent blood glucose readings are as follows:***   Chart review: Recent office visits:  None  Recent consult visits:  01/16/23 Altamese Lynwood, MD (Endo) - Patient presented for Uncontrolled type 2 diabetes mellitus with hyperglycemia with long term current use of insulin and other concerns. No medication  changes.  01/09/23 [name] NP (Heart and Vasc) - Patient presented for CHF and other concerns. Decreased Hydralazine. Changed Torsemide    Hospital visits:  Medication Reconciliation was completed by comparing discharge summary, patient's EMR and Pharmacy list, and upon discussion with patient.   Patient presented to [facility] on 12/14/22 due to Paroxysmal A-fib. Patient was present for 2 hours.   New?Medications Started at [facility] Discharge:?? -started  none   Medication Changes at Hospital Discharge: -Changed  none   Medications Discontinued at Hospital Discharge: -Stopped  none   Medications that remain the same after Hospital Discharge:??  -All other medications will remain the same.     Medications:  Medications: Outpatient Encounter Medications as of 01/23/2023  Medication Sig   ACCU-CHEK SOFTCLIX LANCETS lancets Use as instructed for three times daily testing of blood glucose   acetaminophen (TYLENOL) 500 MG tablet Take 1,000 mg by mouth every 6 (six) hours as needed (pain).   albuterol (VENTOLIN HFA) 108 (90 Base) MCG/ACT inhaler Inhale 1-2 puffs into the lungs every 6 (six) hours as needed for wheezing or shortness of breath.   apixaban (ELIQUIS) 5 MG TABS tablet Take 1 tablet (5 mg total) by mouth 2 (two) times daily.   atorvastatin (LIPITOR) 80 MG tablet Take 1 tablet (80 mg total) by mouth daily.   blood glucose meter kit and supplies KIT Dispense based on patient and insurance preference. Use up to four times daily as directed. (FOR ICD-9 250.00, 250.01).   Blood Glucose Monitoring  Suppl (ACCU-CHEK AVIVA PLUS) w/Device KIT Use as directed for 3 times daily testing of blood glucose   carvedilol (COREG) 25 MG tablet Take 12.5 mg by mouth 2 (two) times daily with a meal.   colchicine 0.6 MG tablet On day 1 take 2 tablets at first sign of gout flare.  Take 1 tablet 1 hour later.  Then take 1 tablet daily starting the next day until resolution of gout  flare. (Patient taking differently: On day 1 take 2 tablets at first sign of gout flare.  Take 1 tablet 1 hour later.  Then take 1 tablet daily starting the next day until resolution of gout flare as needed.)   Continuous Blood Gluc Receiver (FREESTYLE LIBRE 2 READER) DEVI Check your blood sugar three times per day before meals and once before bedtime   Continuous Glucose Sensor (FREESTYLE LIBRE 3 SENSOR) MISC 1 Device by Does not apply route continuous.   eplerenone (INSPRA) 50 MG tablet Take 1 tablet (50 mg total) by mouth daily.   glucose blood (ACCU-CHEK AVIVA PLUS) test strip Use as instructed for 3 times daily testing of blood glucose   hydrALAZINE (APRESOLINE) 50 MG tablet Take 1 tablet (50 mg total) by mouth in the morning and at bedtime.   Insulin Aspart (NOVOLOG [employer]) Inject 20 Units into the skin 3 (three) times daily before meals. As needed   insulin degludec (TRESIBA FLEXTOUCH) 200 UNIT/ML FlexTouch Pen 56 units daily   insulin glargine (LANTUS) 100 UNIT/ML injection Inject 0.5 mLs (50 Units total) into the skin daily.   Insulin Pen Needle (PEN NEEDLES) 32G X 4 MM MISC 1 each by Does not apply route daily.   isosorbide mononitrate (IMDUR) 120 MG 24 hr tablet Take 1 tablet (120 mg total) by mouth daily.   JARDIANCE 25 MG TABS tablet TAKE ONE TABLET BY MOUTH BEFORE BREAKFAST daily   Lancet Devices (ACCU-CHEK SOFTCLIX) lancets Use as instructed for 3 times daily testing of blood glucose   metFORMIN (GLUCOPHAGE) 1000 MG tablet TAKE 1 TABLET BY MOUTH ONCE DAILY WITH BREAKFAST . APPOINTMENT REQUIRED FOR FUTURE REFILLS   nitroGLYCERIN (NITROSTAT) 0.4 MG SL tablet Place 1 tablet (0.4 mg total) under the tongue every 5 (five) minutes as needed for chest pain (x 3 tabs daily).   sotalol (BETAPACE) 120 MG tablet Take 1 tablet (120 mg total) by mouth 2 (two) times daily.   torsemide (DEMADEX) 20 MG tablet Take 2 tablets (40 mg total) by mouth 2 (two) times daily.   TRUEPLUS LANCETS 28G MISC 1 each by  Does not apply route 3 (three) times daily.   No facility-administered encounter medications on file as of 01/23/2023.   BP Readings from Last 3 Encounters:  01/16/23 130/80  01/09/23 120/72  12/14/22 (!) 166/102    Pulse Readings from Last 3 Encounters:  01/16/23 (!) 59  01/09/23 62  12/14/22 (!) 53    Lab Results  Component Value Date/Time   HGBA1C 10.3 (A) 01/16/2023 10:05 AM   HGBA1C 10.7 (A) 11/14/2022 11:52 AM   HGBA1C 11.8 (H) 09/10/2022 10:46 AM   HGBA1C 10.5 (H) 05/07/2022 09:54 AM   HGBA1C 9.2 (A) 09/29/2018 10:59 AM   Lab Results  Component Value Date   CREATININE 1.63 (H) 01/21/2023   BUN 18 01/21/2023   GFR 42.73 (L) 09/10/2022   GFRNONAA 46 (L) 01/21/2023   GFRAA 64 07/20/2020   NA 135 01/21/2023   K 3.5 01/21/2023   CALCIUM 8.9 01/21/2023   CO2 25  01/21/2023      Pamala Duffel CMA Clinical Pharmacist Assistant [phone]

## 2023-01-25 ENCOUNTER — Telehealth (HOSPITAL_COMMUNITY): Payer: Self-pay

## 2023-01-25 ENCOUNTER — Other Ambulatory Visit (HOSPITAL_COMMUNITY): Payer: Self-pay | Admitting: Internal Medicine

## 2023-01-25 ENCOUNTER — Telehealth: Payer: Self-pay | Admitting: Family Medicine

## 2023-01-25 DIAGNOSIS — I5032 Chronic diastolic (congestive) heart failure: Secondary | ICD-10-CM

## 2023-01-25 NOTE — Telephone Encounter (Signed)
[place] [facility] is recommending open care opportunity for statin therapy for this patient. If needed pls call at number provided

## 2023-01-25 NOTE — Telephone Encounter (Signed)
Not sure what is being asked, but pt is already on a statin.

## 2023-01-26 DIAGNOSIS — E119 Type 2 diabetes mellitus without complications: Secondary | ICD-10-CM | POA: Diagnosis not present

## 2023-01-29 ENCOUNTER — Telehealth (HOSPITAL_COMMUNITY): Payer: Self-pay

## 2023-01-29 ENCOUNTER — Encounter: Payer: Self-pay | Admitting: Pharmacist

## 2023-01-29 DIAGNOSIS — Z9189 Other specified personal risk factors, not elsewhere classified: Secondary | ICD-10-CM

## 2023-01-29 NOTE — Progress Notes (Signed)
Statin Quality Measure Assessment  01/29/2023  Liban [facility] 1955/08/24 [id number]  Per review of chart and payor information, patient has a diagnosis of diabetes but is not currently filling a statin prescription.  This places patient into the Statin Use In Patients with Diabetes (SUPD) measure for CMS.    Patient has ezetimibe and atorvastatin on his medication list. According to Pharmacy Fill Data, Atorvastatin has not been filled since 12/23.  Patient has an upcoming appointment 01/30/23.  If deemed therapeutically appropriate, patient's statin therapy could be evaluated at the upcoming visit.  If Patient experienced statin intolerance, a statin exclusion code cold be associated with the upcoming visit --which would remove the patient from the measure.  The 10-year ASCVD risk score (Arnett DK, et al., 2019) is: 30.1%   Values used to calculate the score:     Age: 67 years     Sex: Male     Is Non-Hispanic African American: Yes     Diabetic: Yes     Tobacco smoker: No     Systolic Blood Pressure: 130 mmHg     Is BP treated: Yes     HDL Cholesterol: 44 mg/dL     Total Cholesterol: 138 mg/dL 4/78/2956     Component Value Date/Time   CHOL 138 01/09/2023 1056   CHOL 139 04/08/2018 1102   TRIG 138 01/09/2023 1056   HDL 44 01/09/2023 1056   HDL 49 04/08/2018 1102   CHOLHDL 3.1 01/09/2023 1056   VLDL 28 01/09/2023 1056   LDLCALC 66 01/09/2023 1056   LDLCALC 68 04/08/2018 1102   LDLDIRECT 111.0 04/22/2015 1559    Please consider ONE of the following recommendations:  Initiate high intensity statin Atorvastatin 40 mg once daily, #90, 3 refills   Rosuvastatin 20 mg once daily, #90, 3 refills    Initiate moderate intensity          statin with reduced frequency if prior          statin intolerance 1x weekly, #13, 3 refills   2x weekly, #26, 3 refills   3x weekly, #39, 3 refills    Code for past statin intolerance or  other exclusions (required annually)  Provider  Requirements: Associate code during an office visit or telehealth encounter  Drug Induced Myopathy G72.0   Myopathy, unspecified G72.9   Myositis, unspecified M60.9   Rhabdomyolysis M62.82   Cirrhosis of liver K74.69   Prediabetes R73.03   PCOS E28.2   Plan:  Route note to PCP prior to the upcoming appointment.  Beecher Mcardle, PharmD, BCACP [facility] Clinical Pharmacist [phone]

## 2023-01-29 NOTE — Telephone Encounter (Signed)
Called and spoke to [place] about paramedicine visit and he agreed to Wednesday at 2:00. I will follow up with home visit tomorrow. Call complete.   Maralyn Sago, EMT-Paramedic [phone] 01/29/2023

## 2023-01-30 ENCOUNTER — Ambulatory Visit: Payer: 59 | Admitting: "Endocrinology

## 2023-01-30 ENCOUNTER — Other Ambulatory Visit (HOSPITAL_COMMUNITY): Payer: Self-pay

## 2023-01-30 NOTE — Progress Notes (Signed)
Paramedicine Encounter    Patient ID: [name] [name], male    DOB: 1956/08/11, 67 y.o.   MRN: [id number]   Complaints- left ankle pain, fatigue   Assessment- Seated outside on the porch, alert and oriented, denied chest pain, dizziness. Reports he is short of breath at sometimes. Lungs clear, no pedal edema noted.   Compliance with meds- he reports he is- he would not let me assess his medications- he reports he filled his own pillbox and did not need me to.   Pill box filled- he reports filling his own   Refills needed- unknown he reports none   Meds changes since last visit- none     Social changes- none   Arrived for home visit for Maiko who was seated outside on his porch CAOx4, warm and dry. He reports feeling okay with some shortness of breath now and then but denied chest pain, dizziness or swelling. He states that he has been taking his medicines. He reports filling his own pill box and did not need me to look at them. I obtained vitals and assessment. He reports some lower left ankle pain with some mild swelling. He denied any injury. He missed his endocrinology appointment for today. I advised him to reschedule this. He is wearing his freestyle. CBG is 164. He and I reviewed upcoming appointments and confirmed same. I will assist with [facility] and [facility] appointments needed. He requested bi-weekly visits. I will see him in two weeks. Home visit complete.    BP (!) 160/70   Pulse (!) 55   Resp 16   Wt 209 lb (94.8 kg)   SpO2 92%   BMI 29.15 kg/m  Weight yesterday-- did not weigh  Last visit weight-- 199lbs   Maralyn Sago, EMT-Paramedic [phone]  ACTION: Home visit completed    Patient Care Team: Deeann Saint, MD as PCP - General (Family Medicine) [name], MD as PCP - Cardiology (Cardiology) Sherrill Raring, [facility] (Inactive) (Pharmacist)  Patient Active Problem List   Diagnosis Date Noted   Wrist fracture,  left, closed, initial encounter 06/05/2022   Rib pain 06/05/2022   [place] (type) I [facility]) atrioventricular block 01/23/2022   Chest pain of uncertain etiology 01/22/2022   Syncope 02/09/2020   Generalized weakness 06/27/2019   Acute on chronic congestive heart failure with left ventricular diastolic dysfunction (HCC) 06/26/2019   Anemia due to chronic kidney disease 06/26/2019   CKD (chronic kidney disease), stage III (HCC) 06/26/2019   Type 2 diabetes mellitus without complication (HCC) 06/26/2019   Iron deficiency anemia due to chronic blood loss 06/26/2019   History of atrial fibrillation    History of type 2 diabetes mellitus    Anemia    Chest pain 04/24/2019   Overweight (BMI 25.0-29.9) 02/28/2017   Unstable angina (HCC) 08/03/2016   S/P laparoscopic cholecystectomy 02/07/2016   Paroxysmal A-fib (HCC) 01/22/2016   Coronary artery disease involving native coronary artery with unstable angina pectoris (HCC) 12/29/2015   Diabetes (HCC) 11/12/2015   Chronic anticoagulation 08/22/2015   Atrial flutter with controlled response (HCC) 08/22/2015   RUQ pain    Cholelithiases- drain in place 06/04/2015   Essential hypertension    Chronic diastolic CHF (congestive heart failure) (HCC) 04/24/2015   Excessive daytime sleepiness 11/25/2014   Obstructive sleep apnea 11/25/2014   NICM (nonischemic cardiomyopathy) (HCC) 03/11/2014   Anticoagulation goal of INR 2 to 3 10/19/2013   Eczema 10/19/2013   Low libido 10/16/2013  Persistent atrial fibrillation (HCC) 10/06/2013   Rectal bleeding 12/18/2011   HYPERCHOLESTEROLEMIA 07/11/2010   DENTAL CARIES 06/30/2010   GOUT 05/05/2009   HEPATITIS B, CHRONIC 12/30/2006   OSTEOCHONDROMA 12/30/2006   Mitral valve disorder 12/30/2006   LIVER FUNCTION TESTS, ABNORMAL, HX OF 12/30/2006   COLONIC POLYPS, HX OF 12/30/2006   COLONOSCOPY, HX OF 05/12/2001    Current Outpatient Medications:    ACCU-CHEK SOFTCLIX LANCETS lancets, Use as  instructed for three times daily testing of blood glucose, Disp: 100 each, Rfl: 12   acetaminophen (TYLENOL) 500 MG tablet, Take 1,000 mg by mouth every 6 (six) hours as needed (pain)., Disp: , Rfl:    albuterol (VENTOLIN HFA) 108 (90 Base) MCG/ACT inhaler, Inhale 1-2 puffs into the lungs every 6 (six) hours as needed for wheezing or shortness of breath., Disp: 8 g, Rfl: 0   apixaban (ELIQUIS) 5 MG TABS tablet, TAKE ONE TABLET BY MOUTH TWICE DAILY, Disp: 180 tablet, Rfl: 3   atorvastatin (LIPITOR) 80 MG tablet, Take 1 tablet (80 mg total) by mouth daily., Disp: 30 tablet, Rfl: 3   blood glucose meter kit and supplies KIT, Dispense based on patient and insurance preference. Use up to four times daily as directed. (FOR ICD-9 250.00, 250.01)., Disp: 1 each, Rfl: 0   Blood Glucose Monitoring Suppl (ACCU-CHEK AVIVA PLUS) w/Device KIT, Use as directed for 3 times daily testing of blood glucose, Disp: 1 kit, Rfl: 0   carvedilol (COREG) 25 MG tablet, Take 12.5 mg by mouth 2 (two) times daily with a meal., Disp: , Rfl:    colchicine 0.6 MG tablet, On day 1 take 2 tablets at first sign of gout flare.  Take 1 tablet 1 hour later.  Then take 1 tablet daily starting the next day until resolution of gout flare. (Patient taking differently: On day 1 take 2 tablets at first sign of gout flare.  Take 1 tablet 1 hour later.  Then take 1 tablet daily starting the next day until resolution of gout flare as needed.), Disp: 10 tablet, Rfl: 1   Continuous Blood Gluc Receiver (FREESTYLE LIBRE 2 READER) DEVI, Check your blood sugar three times per day before meals and once before bedtime, Disp: 2 each, Rfl: 11   Continuous Glucose Sensor (FREESTYLE LIBRE 3 SENSOR) MISC, 1 Device by Does not apply route continuous., Disp: 6 each, Rfl: 0   eplerenone (INSPRA) 50 MG tablet, Take 1 tablet (50 mg total) by mouth daily., Disp: 90 tablet, Rfl: 3   glucose blood (ACCU-CHEK AVIVA PLUS) test strip, Use as instructed for 3 times daily  testing of blood glucose, Disp: 100 each, Rfl: 12   hydrALAZINE (APRESOLINE) 50 MG tablet, Take 1 tablet (50 mg total) by mouth in the morning and at bedtime., Disp: 60 tablet, Rfl: 6   Insulin Aspart (NOVOLOG ), Inject 20 Units into the skin 3 (three) times daily before meals. As needed, Disp: , Rfl:    insulin degludec (TRESIBA FLEXTOUCH) 200 UNIT/ML FlexTouch Pen, 56 units daily, Disp: 9 mL, Rfl: 1   insulin glargine (LANTUS) 100 UNIT/ML injection, Inject 0.5 mLs (50 Units total) into the skin daily., Disp: 45 mL, Rfl: 1   Insulin Pen Needle (PEN NEEDLES) 32G X 4 MM MISC, 1 each by Does not apply route daily., Disp: 100 each, Rfl: 3   isosorbide mononitrate (IMDUR) 120 MG 24 hr tablet, TAKE ONE TABLET BY MOUTH ONCE DAILY, Disp: 90 tablet, Rfl: 3   JARDIANCE 25 MG TABS tablet,  TAKE ONE TABLET BY MOUTH BEFORE BREAKFAST daily, Disp: 30 tablet, Rfl: 3   Lancet Devices (ACCU-CHEK SOFTCLIX) lancets, Use as instructed for 3 times daily testing of blood glucose, Disp: 1 each, Rfl: 0   metFORMIN (GLUCOPHAGE) 1000 MG tablet, TAKE 1 TABLET BY MOUTH ONCE DAILY WITH BREAKFAST . APPOINTMENT REQUIRED FOR FUTURE REFILLS, Disp: 90 tablet, Rfl: 1   nitroGLYCERIN (NITROSTAT) 0.4 MG SL tablet, Place 1 tablet (0.4 mg total) under the tongue every 5 (five) minutes as needed for chest pain (x 3 tabs daily)., Disp: 60 tablet, Rfl: 1   sotalol (BETAPACE) 120 MG tablet, Take 1 tablet (120 mg total) by mouth 2 (two) times daily., Disp: 60 tablet, Rfl: 3   torsemide (DEMADEX) 20 MG tablet, Take 2 tablets (40 mg total) by mouth 2 (two) times daily., Disp: 120 tablet, Rfl: 6   TRUEPLUS LANCETS 28G MISC, 1 each by Does not apply route 3 (three) times daily., Disp: 100 each, Rfl: 12 Allergies  Allergen Reactions   Shrimp [Shellfish Allergy] Nausea And Vomiting   Ace Inhibitors Cough   Other Hives and Other (See Comments)    Patient reports developing hives after receiving "some antibiotic given in 1980''s at [facility]". He does not know which antibiotic.     Social History   Socioeconomic History   Marital status: Widowed    Spouse name: Not on file   Number of children: 2   Years of education: 14   Highest education level: Some college, no degree  Occupational History    Employer: UNEMPLOYED  Tobacco Use   Smoking status: Former    Packs/day: 4.00    Years: 18.00    Additional pack years: 0.00    Total pack years: 72.00    Types: Cigarettes    Quit date: 08/14/1983    Years since quitting: 39.4   Smokeless tobacco: Never  Vaping Use   Vaping Use: Never used  Substance and Sexual Activity   Alcohol use: Yes    Alcohol/week: 16.0 standard drinks of alcohol    Types: 6 Cans of beer, 10 Shots of liquor per week    Comment: 2-3 beers watching sports; occasional glass of liquor; drinks heavily in football season   Drug use: Yes    Types: Marijuana    Comment: twice a week   Sexual activity: Never  Other Topics Concern   Not on file  Social History Narrative   Not on file   Social Determinants of Health   Financial Resource Strain: Low Risk  (07/23/2022)   Overall Financial Resource Strain (CARDIA)    Difficulty of Paying Living Expenses: Not hard at all  Food Insecurity: No Food Insecurity (11/05/2022)   Hunger Vital Sign    Worried About Running Out of Food in the Last Year: Never true    Ran Out of Food in the Last Year: Never true  Transportation Needs: No Transportation Needs (07/23/2022)   PRAPARE - Administrator, Civil Service (Medical): No    Lack of Transportation (Non-Medical): No  Physical Activity: Inactive (07/23/2022)   Exercise Vital Sign    Days of Exercise per Week: 0 days    Minutes of Exercise per Session: 0 min  Stress: No Stress Concern Present (07/23/2022)   [employer] of Occupational Health - Occupational Stress Questionnaire    Feeling of Stress : Not at all  Social Connections: Socially Isolated (07/23/2022)   Social  Connection and Isolation Panel [NHANES]  Frequency of Communication with Friends and Family: More than three times a week    Frequency of Social Gatherings with Friends and Family: More than three times a week    Attends Religious Services: Never    Database administrator or Organizations: No    Attends Banker Meetings: Never    Marital Status: Widowed  Intimate Partner Violence: Not At Risk (07/23/2022)   Humiliation, Afraid, Rape, and Kick questionnaire    Fear of Current or Ex-Partner: No    Emotionally Abused: No    Physically Abused: No    Sexually Abused: No    Physical Exam      Future Appointments  Date Time Provider Department Center  02/04/2023  1:00 PM Sherrill Raring, RPH [facility] None  02/11/2023  8:30 AM [facility] LAB [facility] None  03/13/2023 10:20 AM [name], MD [facility] None  07/26/2023  2:00 PM [facility]-ANNUAL WELLNESS VISIT [facility]

## 2023-02-05 ENCOUNTER — Telehealth (HOSPITAL_COMMUNITY): Payer: Self-pay

## 2023-02-05 NOTE — Telephone Encounter (Signed)
Disability Parking Placard mailed to patient's house

## 2023-02-11 ENCOUNTER — Other Ambulatory Visit: Payer: 59

## 2023-02-11 ENCOUNTER — Other Ambulatory Visit (HOSPITAL_COMMUNITY): Payer: 59

## 2023-02-11 NOTE — Progress Notes (Signed)
   02/11/2023  Patient ID: [name] [name], male   DOB: 1956/06/19, 67 y.o.   MRN: [id number]  Unsuccessful outreach attempt for scheduled telephone visit.  Called home and mobile numbers x2, and was able to leave a message on home number but not mobile.  Left HIPAA compliant voicemail with my direct number to return my call.  Will also send MyChart message to attempt to reschedule and call next week if I do not hear back.  Lenna Gilford, PharmD, DPLA

## 2023-02-13 ENCOUNTER — Ambulatory Visit (HOSPITAL_COMMUNITY)
Admission: RE | Admit: 2023-02-13 | Discharge: 2023-02-13 | Disposition: A | Discharge status: Home / Self Care | Payer: 59 | Source: Ambulatory Visit | Attending: Cardiology | Admitting: Cardiology

## 2023-02-13 ENCOUNTER — Telehealth (HOSPITAL_COMMUNITY): Payer: Self-pay

## 2023-02-13 DIAGNOSIS — E7800 Pure hypercholesterolemia, unspecified: Secondary | ICD-10-CM | POA: Diagnosis not present

## 2023-02-13 LAB — LIPID PANEL
Cholesterol: 116 mg/dL (ref 0–200)
HDL: 39 mg/dL — ABNORMAL LOW (ref >40)
LDL Cholesterol: 48 mg/dL (ref 0–99)
Total CHOL/HDL Ratio: 3 RATIO
Triglycerides: 143 mg/dL (ref <150)
VLDL: 29 mg/dL (ref 0–40)

## 2023-02-13 LAB — HEPATIC FUNCTION PANEL
ALT: 15 U/L (ref 0–44)
AST: 18 U/L (ref 15–41)
Albumin: 3.5 g/dL (ref 3.5–5.0)
Alkaline Phosphatase: 65 U/L (ref 38–126)
Bilirubin, Direct: 0.2 mg/dL (ref 0.0–0.2)
Indirect Bilirubin: 0.6 mg/dL (ref 0.3–0.9)
Total Bilirubin: 0.8 mg/dL (ref 0.3–1.2)
Total Protein: 7.1 g/dL (ref 6.5–8.1)

## 2023-02-13 NOTE — Telephone Encounter (Signed)
Attempted to call Mr. Kirchberg with no success- will continue to reach out to schedule paramedicine.   Maralyn Sago, EMT-Paramedic [phone] 02/13/2023

## 2023-02-18 ENCOUNTER — Telehealth: Payer: Self-pay

## 2023-02-18 NOTE — Progress Notes (Signed)
   02/18/2023  Patient ID: [name] [name], male   DOB: 09-Aug-1956, 67 y.o.   MRN: [id number]  Patient outreach to reschedule previous telephone visit to transition care to [facility] PharmD.  Called home number and was unable to make contact or leave a voicemail.  I also did not reach [name] on his mobile number but was able to leave HIPAA compliant voicemail with my direct number.  I will also send a MyChart message to try to reschedule missed telephone appointment from last week.   Lenna Gilford, PharmD, DPLA

## 2023-02-21 ENCOUNTER — Telehealth (HOSPITAL_COMMUNITY): Payer: Self-pay | Admitting: Cardiology

## 2023-02-21 ENCOUNTER — Telehealth (HOSPITAL_COMMUNITY): Payer: Self-pay

## 2023-02-21 DIAGNOSIS — I5032 Chronic diastolic (congestive) heart failure: Secondary | ICD-10-CM

## 2023-02-21 NOTE — Telephone Encounter (Signed)
Attempted to reach patient for home paramedicine visit schedule and he did not answer, unable to leave a message. I will try again next week.   Maralyn Sago, EMT-Paramedic [phone] 02/21/2023

## 2023-02-21 NOTE — Telephone Encounter (Signed)
Pt returned call to schedule TEE/Dccv -originally ordered 01/09/23 by Aniceto Boss Diaz,NP -aflutter noted    -multiple unsuccessful attempts to arrange procedure with pt and provider availability   Will confirm if ok to still arrange at this time.

## 2023-02-21 NOTE — Telephone Encounter (Signed)
Yes, arrange with me next week.

## 2023-02-21 NOTE — Telephone Encounter (Signed)
Attempted to call pt to verify pharmacy for refills. LVM.

## 2023-02-22 ENCOUNTER — Encounter (HOSPITAL_COMMUNITY): Payer: Self-pay | Admitting: Cardiology

## 2023-02-22 NOTE — Telephone Encounter (Signed)
   Pt aware of details below copy sent via mychart  -orders/meds reviewed with Dinah Beers    Dear [name] [name]  You are scheduled for a TEE (Transesophageal Echocardiogram) Guided Cardioversion on Friday, July 19 with Dr. Shirlee Latch.  Please arrive at the [facility] (Main Entrance A) at [facility]: [address] [place], [place] [address] at 7:00 AM (This time is 1.5 hour(s) before your procedure to ensure your preparation). Free valet parking service is available. You will check in at ADMITTING. The support person will be asked to wait in the waiting room.  It is OK to have someone drop you off and come back when you are ready to be discharged.     DIET:  Nothing to eat or drink after midnight except a sip of water with medications (see medication instructions below)  MEDICATION INSTRUCTIONS: !!IF ANY NEW MEDICATIONS ARE STARTED AFTER TODAY, PLEASE NOTIFY YOUR PROVIDER AS SOON AS POSSIBLE!!    Hold Novolog and [name] 03/01/2023 Half dose of Lantus (25 units)the night before procedure 02/28/2023  HOLD: Empagliflozin (Jardiance) for 3 days prior to the procedure. Last dose on Monday, July 15.   Continue taking your anticoagulant (blood thinner): Apixaban (Eliquis).  You will need to continue this after your procedure until you are told by your provider that it is safe to stop.    [facility] to [facility] Monday 02/25/2023 @ 245pm  FYI:  For your safety, and to allow [place] to monitor your vital signs accurately during the surgery/procedure we request: If you have artificial nails, gel coating, SNS etc, please have those removed prior to your surgery/procedure. Not having the nail coverings /polish removed may result in cancellation or delay of your surgery/procedure.  You must have a responsible person to drive you home and stay in the waiting area during your procedure. Failure to do so could result in cancellation.  Bring your insurance  cards.  *Special Note: Every effort is made to have your procedure done on time. Occasionally there are emergencies that occur at the hospital that may cause delays. Please be patient if a delay does occur.

## 2023-02-25 ENCOUNTER — Other Ambulatory Visit (HOSPITAL_COMMUNITY): Payer: 59

## 2023-02-27 ENCOUNTER — Telehealth (HOSPITAL_COMMUNITY): Payer: Self-pay | Admitting: Licensed Clinical Social Worker

## 2023-02-27 ENCOUNTER — Ambulatory Visit (HOSPITAL_COMMUNITY)
Admission: RE | Admit: 2023-02-27 | Discharge: 2023-02-27 | Disposition: A | Discharge status: Home / Self Care | Payer: 59 | Source: Ambulatory Visit | Attending: Cardiology | Admitting: Cardiology

## 2023-02-27 ENCOUNTER — Telehealth (HOSPITAL_COMMUNITY): Payer: Self-pay

## 2023-02-27 DIAGNOSIS — I5032 Chronic diastolic (congestive) heart failure: Secondary | ICD-10-CM | POA: Insufficient documentation

## 2023-02-27 LAB — CBC
HCT: 53.8 % — ABNORMAL HIGH (ref 39.0–52.0)
Hemoglobin: 17.4 g/dL — ABNORMAL HIGH (ref 13.0–17.0)
MCH: 27.9 pg (ref 26.0–34.0)
MCHC: 32.3 g/dL (ref 30.0–36.0)
MCV: 86.2 fL (ref 80.0–100.0)
Platelets: 171 10*3/uL (ref 150–400)
RBC: 6.24 MIL/uL — ABNORMAL HIGH (ref 4.22–5.81)
RDW: 17.2 % — ABNORMAL HIGH (ref 11.5–15.5)
WBC: 6.2 10*3/uL (ref 4.0–10.5)
nRBC: 0 % (ref 0.0–0.2)

## 2023-02-27 LAB — BASIC METABOLIC PANEL
Anion gap: 11 (ref 5–15)
BUN: 11 mg/dL (ref 8–23)
CO2: 24 mmol/L (ref 22–32)
Calcium: 8.9 mg/dL (ref 8.9–10.3)
Chloride: 101 mmol/L (ref 98–111)
Creatinine, Ser: 1.3 mg/dL — ABNORMAL HIGH (ref 0.61–1.24)
GFR, Estimated: 60 mL/min — ABNORMAL LOW (ref >60)
Glucose, Bld: 249 mg/dL — ABNORMAL HIGH (ref 70–99)
Potassium: 3.4 mmol/L — ABNORMAL LOW (ref 3.5–5.1)
Sodium: 136 mmol/L (ref 135–145)

## 2023-02-27 NOTE — Telephone Encounter (Signed)
HF Paramedicine Team Based Care Meeting  One Month Post Enrollment Assessment  HF MD- NA  HF NP - Amy Clegg NP-C   [facility] - Inpatient HF Paramedicine  [name] [name]  [facility] admit within the last 30 days for heart failure? no   Medications concerns? Won't paramedic do pillbox- says he feels more comfortable with his system of taking out of the bottle.  Transportation issues? no   Education needs? Patient seems to be able to express what meds are for but he also has signs of confusion with meds and some signs that he isn't taking meds.  SDOH concerns? no  [facility] Referrals Placed: no  Barriers to discharge? Patient not participating with program meaningfully- CSW to speak with him to discuss DC vs being more involved with program     Burna Sis, LCSW Clinical Social Worker [facility] Desk#: [phone] Cell#: [phone]

## 2023-02-27 NOTE — Telephone Encounter (Signed)
Spoke to [employer], Tonye Becket NP and Dr. Gala Romney in clinic meeting today where we discussed Mr. Fawver consistent non compliance with paramedicine- not answering phone calls or allowing home visits or assistance with medications. They agreed with discharge from program as this is our second attempt at having him involved with the AHF Paramedicine Program. Call attempted to make Mr. Kincer aware but unsuccessful.   Maralyn Sago, EMT-Paramedic [phone] 02/27/2023

## 2023-02-28 ENCOUNTER — Encounter (HOSPITAL_COMMUNITY): Payer: Self-pay | Admitting: Registered Nurse

## 2023-02-28 NOTE — Progress Notes (Signed)
  Arrive to hospital at 0800. Patient called with pre-procedure instructions for tomorrow NPO after midnight Take morning medications w/sip of water: Take blood thinners and BP medication w/sip of water.  Hold [name], [name], [name], etc. For one week.  Confirmed no breaks in taking blood thinner for three weeks Needs a ride home and have a responsible adult to stay w/him or her for 24 hours No lotion day of procedure No jewelry Patient stated during phone call that he has missed one dose of Eliquis within the last 3 weeks. Attempting to reach Dr. Shirlee Latch.

## 2023-03-01 ENCOUNTER — Ambulatory Visit (HOSPITAL_COMMUNITY): Admission: RE | Admit: 2023-03-01 | Payer: 59 | Source: Home / Self Care | Admitting: Cardiology

## 2023-03-01 ENCOUNTER — Ambulatory Visit (HOSPITAL_COMMUNITY): Payer: 59 | Attending: Cardiology

## 2023-03-01 ENCOUNTER — Encounter (HOSPITAL_COMMUNITY): Admission: RE | Payer: Self-pay | Source: Home / Self Care

## 2023-03-01 SURGERY — ECHOCARDIOGRAM, TRANSESOPHAGEAL
Anesthesia: General

## 2023-03-07 ENCOUNTER — Telehealth (HOSPITAL_COMMUNITY): Payer: Self-pay

## 2023-03-07 NOTE — Telephone Encounter (Signed)
Spoke to patient about procedure scheduled for tomorrow. Aware of nothing to eat or drink after midnight.Taking Lantus 25 units(half usual dose) No insulin in  the am.Has been holding Jardiance as directed. Has transportation to and from procedure. Aware to continue Eliquis.

## 2023-03-07 NOTE — OR Nursing (Signed)
Called patient with pre-procedure instructions for tomorrow.   Patient informed of:   Time to arrive for procedure. 1230 Remain NPO past midnight.  Must have a ride home and a responsible adult to remain with them for 24 ours post procedure.  Instructed to take blood thinner. Eliquis. Instructed patient to stop taking all GLP-1s and GLP-2s for at least one week before procedure. Jardiance.  Left a message for patient with above instructions. Left phone number for him to call back with any questions.

## 2023-03-07 NOTE — Progress Notes (Signed)
Pt returned call, informed to be at hospital at 1230, nothing to eat or drink after midnight, except important meds, and pt must have someone to drive them home and stay with them for 24 hours post procedure

## 2023-03-08 ENCOUNTER — Encounter (HOSPITAL_COMMUNITY)
Admission: RE | Disposition: A | Discharge status: Home / Self Care | Payer: Self-pay | Source: Home / Self Care | Attending: Cardiology

## 2023-03-08 ENCOUNTER — Ambulatory Visit (HOSPITAL_COMMUNITY): Payer: 59

## 2023-03-08 ENCOUNTER — Encounter (HOSPITAL_COMMUNITY): Payer: Self-pay | Admitting: Anesthesiology

## 2023-03-08 ENCOUNTER — Ambulatory Visit (HOSPITAL_COMMUNITY)
Admission: RE | Admit: 2023-03-08 | Discharge: 2023-03-08 | Disposition: A | Discharge status: Home / Self Care | Payer: 59 | Attending: Cardiology | Admitting: Cardiology

## 2023-03-08 ENCOUNTER — Other Ambulatory Visit: Payer: Self-pay

## 2023-03-08 DIAGNOSIS — Z539 Procedure and treatment not carried out, unspecified reason: Secondary | ICD-10-CM | POA: Diagnosis not present

## 2023-03-08 DIAGNOSIS — I4819 Other persistent atrial fibrillation: Secondary | ICD-10-CM | POA: Insufficient documentation

## 2023-03-08 DIAGNOSIS — I5033 Acute on chronic diastolic (congestive) heart failure: Secondary | ICD-10-CM

## 2023-03-08 SURGERY — INVASIVE LAB ABORTED CASE

## 2023-03-08 SURGICAL SUPPLY — 1 items: ELECT DEFIB PAD ADLT CADENCE (PAD) ×2 IMPLANT

## 2023-03-08 NOTE — Anesthesia Preprocedure Evaluation (Signed)
Anesthesia Evaluation    Reviewed: Allergy & Precautions, Patient's Chart, lab work & pertinent test results, reviewed documented beta blocker date and time   Airway        Dental   Pulmonary sleep apnea (noncompliant w/ CPAP) , Patient abstained from smoking., former smoker 72 pack year history, quit 1985          Cardiovascular hypertension, Pt. on medications and Pt. on home beta blockers + CAD, + Cardiac Stents (last stent placed 2016) and +CHF (hx NICM, LVEF most recently 50%)  + dysrhythmias (eliquis) Atrial Fibrillation   Echo 12/2022 1. Left ventricular ejection fraction, by estimation, is 50%. The left  ventricle has mildly decreased function. The left ventricle demonstrates  global hypokinesis. There is severe asymmetric left ventricular  hypertrophy of the septal segment. No LV  outflow tract gradient or mitral valve systolic anterior motion.   2. Right ventricular systolic function is normal. The right ventricular  size is normal.   3. Left atrial size was moderately dilated. A mobile left atrial  appendage thrombus was detected.   4. Right atrial size was mildly dilated.   5. The mitral valve is normal in structure. Trivial mitral valve  regurgitation. No evidence of mitral stenosis.   6. The aortic valve is tricuspid. Aortic valve regurgitation is trivial.  No aortic stenosis is present.   7. No PFO or ASD.   8. No cardioversion was done due to LA appendage thrombus.      Neuro/Psych  PSYCHIATRIC DISORDERS Anxiety     negative neurological ROS     GI/Hepatic hiatal hernia,,,(+)       alcohol use and marijuana use, Hepatitis -, B16 drinks/wk    Endo/Other  diabetes, Poorly Controlled, Type 2, Insulin Dependent, Oral Hypoglycemic Agents  Last A1c 10.3  Renal/GU Renal InsufficiencyRenal diseaseCr 1.3  negative genitourinary   Musculoskeletal negative musculoskeletal ROS (+)    Abdominal   Peds   Hematology negative hematology ROS (+)   Anesthesia Other Findings   Reproductive/Obstetrics negative OB ROS                             Anesthesia Physical Anesthesia Plan  ASA: 3  Anesthesia Plan: MAC   Post-op Pain Management:    Induction:   PONV Risk Score and Plan: 2 and Propofol infusion and TIVA  Airway Management Planned: Natural Airway and Simple Face Mask  Additional Equipment: None  Intra-op Plan:   Post-operative Plan:   Informed Consent: I have reviewed the patients History and Physical, chart, labs and discussed the procedure including the risks, benefits and alternatives for the proposed anesthesia with the patient or authorized representative who has indicated his/her understanding and acceptance.       Plan Discussed with: CRNA  Anesthesia Plan Comments:        Anesthesia Quick Evaluation

## 2023-03-08 NOTE — H&P (Signed)
Advanced Heart Failure Team History and Physical Note   PCP:  Deeann Saint, MD  PCP-Cardiology: Marca Ancona, MD     Reason for Admission: Atrial fibrillation   HPI:    Patient with h/o persistent AF presents for TEE-DCCV.  He has been compliant over the last few days with Eliquis.     Review of Systems: [y] = yes, [ ]  = no   General: Weight gain [ ] ; Weight loss [ ] ; Anorexia [ ] ; Fatigue [ ] ; Fever [ ] ; Chills [ ] ; Weakness [ ]   Cardiac: Chest pain/pressure [ ] ; Resting SOB [ ] ; Exertional SOB [ ] ; Orthopnea [ ] ; Pedal Edema [ ] ; Palpitations [ ] ; Syncope [ ] ; Presyncope [ ] ; Paroxysmal nocturnal dyspnea[ ]   Pulmonary: Cough [ ] ; Wheezing[ ] ; Hemoptysis[ ] ; Sputum [ ] ; Snoring [ ]   GI: Vomiting[ ] ; Dysphagia[ ] ; Melena[ ] ; Hematochezia [ ] ; Heartburn[ ] ; Abdominal pain [ ] ; Constipation [ ] ; Diarrhea [ ] ; BRBPR [ ]   GU: Hematuria[ ] ; Dysuria [ ] ; Nocturia[ ]   Vascular: Pain in legs with walking [ ] ; Pain in feet with lying flat [ ] ; Non-healing sores [ ] ; Stroke [ ] ; TIA [ ] ; Slurred speech [ ] ;  Neuro: Headaches[ ] ; Vertigo[ ] ; Seizures[ ] ; Paresthesias[ ] ;Blurred vision [ ] ; Diplopia [ ] ; Vision changes [ ]   Ortho/Skin: Arthritis [ ] ; Joint pain [ ] ; Muscle pain [ ] ; Joint swelling [ ] ; Back Pain [ ] ; Rash [ ]   Psych: Depression[ ] ; Anxiety[ ]   Heme: Bleeding problems [ ] ; Clotting disorders [ ] ; Anemia [ ]   Endocrine: Diabetes [ ] ; Thyroid dysfunction[ ]    Home Medications Prior to Admission medications   Medication Sig Start Date End Date Taking? Authorizing Provider  ACCU-CHEK SOFTCLIX LANCETS lancets Use as instructed for three times daily testing of blood glucose 09/06/16   Jegede, Olugbemiga E, MD  acetaminophen (TYLENOL) 500 MG tablet Take 1,000 mg by mouth every 6 (six) hours as needed (pain).    [provider]  albuterol (VENTOLIN HFA) 108 (90 Base) MCG/ACT inhaler Inhale 1-2 puffs into the lungs every 6 (six) hours as needed for wheezing or shortness  of breath. 04/06/20   Cathie Hoops, Amy V, PA-C  apixaban (ELIQUIS) 5 MG TABS tablet TAKE ONE TABLET BY MOUTH TWICE DAILY 01/28/23   [name], MD  atorvastatin (LIPITOR) 80 MG tablet Take 1 tablet (80 mg total) by mouth daily. 11/06/22   Bensimhon, Bevelyn Buckles, MD  blood glucose meter kit and supplies KIT Dispense based on patient and insurance preference. Use up to four times daily as directed. (FOR ICD-9 250.00, 250.01). 07/20/20   Deeann Saint, MD  Blood Glucose Monitoring Suppl (ACCU-CHEK AVIVA PLUS) w/Device KIT Use as directed for 3 times daily testing of blood glucose 09/06/16   Jegede, Olugbemiga E, MD  carvedilol (COREG) 25 MG tablet Take 12.5 mg by mouth 2 (two) times daily with a meal.    [provider]  colchicine 0.6 MG tablet On day 1 take 2 tablets at first sign of gout flare.  Take 1 tablet 1 hour later.  Then take 1 tablet daily starting the next day until resolution of gout flare. Patient taking differently: On day 1 take 2 tablets at first sign of gout flare.  Take 1 tablet 1 hour later.  Then take 1 tablet daily starting the next day until resolution of gout flare as needed. 05/24/21   Jacklynn Ganong, FNP  Continuous  Blood Gluc Receiver (FREESTYLE LIBRE 2 READER) DEVI Check your blood sugar three times per day before meals and once before bedtime 02/19/22   Deeann Saint, MD  Continuous Glucose Sensor (FREESTYLE LIBRE 3 SENSOR) MISC 1 Device by Does not apply route continuous. 01/16/23   Altamese Ferney, MD  eplerenone (INSPRA) 50 MG tablet Take 1 tablet (50 mg total) by mouth daily. 12/10/22   [name], MD  glucose blood (ACCU-CHEK AVIVA PLUS) test strip Use as instructed for 3 times daily testing of blood glucose 09/06/16   Jeanann Lewandowsky E, MD  hydrALAZINE (APRESOLINE) 50 MG tablet Take 1 tablet (50 mg total) by mouth in the morning and at bedtime. 01/09/23   Alen Bleacher, NP  Insulin Aspart (NOVOLOG Goodell) Inject 20 Units into the skin 3 (three) times daily before  meals. As needed    [provider]  insulin degludec (TRESIBA FLEXTOUCH) 200 UNIT/ML FlexTouch Pen 56 units daily 11/14/22   [name], MD  insulin glargine (LANTUS) 100 UNIT/ML injection Inject 0.5 mLs (50 Units total) into the skin daily. 01/16/23 04/16/23  Motwani, Carin Hock, MD  Insulin Pen Needle (PEN NEEDLES) 32G X 4 MM MISC 1 each by Does not apply route daily. 06/08/22   [name], MD  isosorbide mononitrate (IMDUR) 120 MG 24 hr tablet TAKE ONE TABLET BY MOUTH ONCE DAILY 01/28/23   [name], MD  JARDIANCE 25 MG TABS tablet TAKE ONE TABLET BY MOUTH BEFORE BREAKFAST daily 01/23/23   [name], MD  Lancet Devices [facility]) lancets Use as instructed for 3 times daily testing of blood glucose 09/06/16   Jegede, Olugbemiga E, MD  metFORMIN (GLUCOPHAGE) 1000 MG tablet TAKE 1 TABLET BY MOUTH ONCE DAILY WITH BREAKFAST . APPOINTMENT REQUIRED FOR FUTURE REFILLS 01/16/23   Altamese Sutherland, MD  nitroGLYCERIN (NITROSTAT) 0.4 MG SL tablet Place 1 tablet (0.4 mg total) under the tongue every 5 (five) minutes as needed for chest pain (x 3 tabs daily). 11/06/22   Bensimhon, Bevelyn Buckles, MD  sotalol (BETAPACE) 120 MG tablet Take 1 tablet (120 mg total) by mouth 2 (two) times daily. 11/06/22   Bensimhon, Bevelyn Buckles, MD  torsemide (DEMADEX) 20 MG tablet Take 2 tablets (40 mg total) by mouth 2 (two) times daily. 01/09/23   Alen Bleacher, NP  TRUEPLUS LANCETS 28G MISC 1 each by Does not apply route 3 (three) times daily. 08/22/16   Quentin Angst, MD    Past Medical History: Past Medical History:  Diagnosis Date   Anxiety    CAD (coronary artery disease), native coronary artery    a. Nonobstructive CAD by cath 2013 - diffuse distal and branch vessel CAD, no severe disease in the major coronaries, LV mild global hypokinesis, EF 45%. b. ETT-Sestamibi 5/14: EF 31%, small fixed inferior defect with no ischemia.   Chronic CHF (HCC)    a. Mixed ICM/NICM (?EtOH). EF 35% in 2008. Echo 5/13: EF 60-65%,  mod LVH, EF 45% on V gram in 12/2011. EF 12/2012: EF 50-55%, mild LVH, inferobasal HK, mild MR. ETT-Ses 5/14 EF 41%. Cardiac MRI 5/14: EF 44%, mild global HK, subepicardial delayed enhancement in nonspecific RV insertion pattern.   COLONIC POLYPS, HX OF 12/30/2006   Gout    H/O atrial flutter 08/13/2005   a. Ablations in 2007, 2008.   Heart murmur    HEPATITIS B, CHRONIC 12/30/2006   History of alcohol abuse    History of hiatal hernia    History  of medication noncompliance    HIV infection (HCC)    HYPERCHOLESTEROLEMIA 07/11/2010   Hypertension    Left sciatic nerve pain since 04/2015   LIVER FUNCTION TESTS, ABNORMAL, HX OF 12/30/2006   MITRAL REGURGITATION 12/30/2006   Osteochondrosarcoma (HCC) 08/13/1970   "left shoulder"   PAF (paroxysmal atrial fibrillation) (HCC)    On coumadin   Sleep apnea    "suppose to send mask but they never did" (05/03/2015)   Type II diabetes mellitus [facility])     Past Surgical History: Past Surgical History:  Procedure Laterality Date   A FLUTTER ABLATION  2007, 2008   catheter ablation    CARDIAC CATHETERIZATION N/A 05/03/2015   Procedure: Right/Left Heart Cath and Coronary Angiography;  Surgeon: [name], MD;  Location: [facility] INVASIVE CV LAB;  Service: Cardiovascular;  Laterality: N/A;   CARDIAC CATHETERIZATION N/A 05/03/2015   Procedure: Coronary Stent Intervention;  Surgeon: Corky Crafts, MD;  Location: [facility] INVASIVE CV LAB;  Service: Cardiovascular;  Laterality: N/A;   CARDIAC CATHETERIZATION N/A 11/04/2015   Procedure: Left Heart Cath and Coronary Angiography;  Surgeon: [name], MD;  Location: [facility] INVASIVE CV LAB;  Service: Cardiovascular;  Laterality: N/A;   CARDIOVERSION N/A 08/24/2015   Procedure: CARDIOVERSION;  Surgeon: Vesta Mixer, MD;  Location: [facility] ENDOSCOPY;  Service: Cardiovascular;  Laterality: N/A;   CARDIOVERSION N/A 12/14/2022   Procedure: CARDIOVERSION;  Surgeon: [name], MD;  Location: [facility] INVASIVE CV LAB;   Service: Cardiovascular;  Laterality: N/A;   CHOLECYSTECTOMY N/A 02/07/2016   Procedure: LAPAROSCOPIC CHOLECYSTECTOMY WITH  INTRAOPERATIVE CHOLANGIOGRAM;  Surgeon: Gaynelle Adu, MD;  Location: [facility] ORS;  Service: General;  Laterality: N/A;   COLONOSCOPY WITH PROPOFOL N/A 04/28/2019   Procedure: COLONOSCOPY WITH PROPOFOL;  Surgeon: Kerin Salen, MD;  Location: [facility] ENDOSCOPY;  Service: Gastroenterology;  Laterality: N/A;   CORONARY ANGIOPLASTY  12/05/01   ESOPHAGOGASTRODUODENOSCOPY (EGD) WITH PROPOFOL N/A 04/27/2019   Procedure: ESOPHAGOGASTRODUODENOSCOPY (EGD) WITH PROPOFOL;  Surgeon: Kerin Salen, MD;  Location: [facility] ENDOSCOPY;  Service: Gastroenterology;  Laterality: N/A;   GIVENS CAPSULE STUDY N/A 06/29/2019   Procedure: GIVENS CAPSULE STUDY;  Surgeon: Charlott Rakes, MD;  Location: [facility] ENDOSCOPY;  Service: Endoscopy;  Laterality: N/A;   LEFT HEART CATH AND CORONARY ANGIOGRAPHY N/A 12/28/2016   Procedure: Left Heart Cath and Coronary Angiography;  Surgeon: [name], MD;  Location: [facility] INVASIVE CV LAB;  Service: Cardiovascular;  Laterality: N/A;   LEFT HEART CATHETERIZATION WITH CORONARY ANGIOGRAM N/A 12/19/2011   Procedure: LEFT HEART CATHETERIZATION WITH CORONARY ANGIOGRAM;  Surgeon: [name], MD;  Location: [facility] CATH LAB;  Service: Cardiovascular;  Laterality: N/A;   OSTEOCHONDROMA EXCISION Left 1972   "took bone tumor off my shoulder"   POLYPECTOMY  04/28/2019   Procedure: POLYPECTOMY;  Surgeon: Kerin Salen, MD;  Location: [facility] ENDOSCOPY;  Service: Gastroenterology;;   PRESSURE SENSOR/CARDIOMEMS N/A 02/01/2020   Procedure: PRESSURE SENSOR/CARDIOMEMS;  Surgeon: [name], MD;  Location: [facility] INVASIVE CV LAB;  Service: Cardiovascular;  Laterality: N/A;   RIGHT HEART CATH N/A 02/01/2020   Procedure: RIGHT HEART CATH;  Surgeon: [name], MD;  Location: [facility] INVASIVE CV LAB;  Service: Cardiovascular;  Laterality: N/A;   RIGHT/LEFT HEART CATH AND CORONARY ANGIOGRAPHY N/A 07/01/2019    Procedure: RIGHT/LEFT HEART CATH AND CORONARY ANGIOGRAPHY;  Surgeon: [name], MD;  Location: [facility] INVASIVE CV LAB;  Service: Cardiovascular;  Laterality: N/A;   TEE WITHOUT CARDIOVERSION N/A 12/14/2022   Procedure: TRANSESOPHAGEAL ECHOCARDIOGRAM;  Surgeon: [name], MD;  Location: [facility] INVASIVE CV LAB;  Service: Cardiovascular;  Laterality: N/A;    Family History:  Family History  Problem Relation Age of Onset   Diabetes Mother    Hypertension Mother    Heart attack Neg Hx    Stroke Neg Hx     Social History: Social History   Socioeconomic History   Marital status: Widowed    Spouse name: Not on file   Number of children: 2   Years of education: 14   Highest education level: Some college, no degree  Occupational History    Employer: UNEMPLOYED  Tobacco Use   Smoking status: Former    Current packs/day: 0.00    Average packs/day: 4.0 packs/day for 18.0 years (72.0 ttl pk-yrs)    Types: Cigarettes    Start date: 08/13/1965    Quit date: 08/14/1983    Years since quitting: 39.5   Smokeless tobacco: Never  Vaping Use   Vaping status: Never Used  Substance and Sexual Activity   Alcohol use: Yes    Alcohol/week: 16.0 standard drinks of alcohol    Types: 6 Cans of beer, 10 Shots of liquor per week    Comment: 2-3 beers watching sports; occasional glass of liquor; drinks heavily in football season   Drug use: Yes    Types: Marijuana    Comment: twice a week   Sexual activity: Never  Other Topics Concern   Not on file  Social History Narrative   Not on file   Social Determinants of Health   Financial Resource Strain: Low Risk  (07/23/2022)   Overall Financial Resource Strain (CARDIA)    Difficulty of Paying Living Expenses: Not hard at all  Food Insecurity: No Food Insecurity (11/05/2022)   Hunger Vital Sign    Worried About Running Out of Food in the Last Year: Never true    Ran Out of Food in the Last Year: Never true  Transportation Needs: No Transportation  Needs (07/23/2022)   PRAPARE - Administrator, Civil Service (Medical): No    Lack of Transportation (Non-Medical): No  Physical Activity: Inactive (07/23/2022)   Exercise Vital Sign    Days of Exercise per Week: 0 days    Minutes of Exercise per Session: 0 min  Stress: No Stress Concern Present (07/23/2022)   [employer] of Occupational Health - Occupational Stress Questionnaire    Feeling of Stress : Not at all  Social Connections: Socially Isolated (07/23/2022)   Social Connection and Isolation Panel [NHANES]    Frequency of Communication with Friends and Family: More than three times a week    Frequency of Social Gatherings with Friends and Family: More than three times a week    Attends Religious Services: Never    Database administrator or Organizations: No    Attends Banker Meetings: Never    Marital Status: Widowed    Allergies:  Allergies  Allergen Reactions   Shrimp [Shellfish Allergy] Nausea And Vomiting   Ace Inhibitors Cough   Other Hives and Other (See Comments)    Patient reports developing hives after receiving "some antibiotic given in 1980''s at [facility] He does not know which antibiotic.    Objective:    Vital Signs:   Temp:  [97.7 F (36.5 C)] 97.7 F (36.5 C) (07/26 1208) Pulse Rate:  [61] 61 (07/26 1208) Resp:  [20] 20 (07/26 1208) BP: (154)/(80) 154/80 (07/26 1208) SpO2:  [95 %]  95 % (07/26 1208) Weight:  [89.8 kg] 89.8 kg (07/26 1208)   Filed Weights   03/08/23 1208  Weight: 89.8 kg     Physical Exam     General:  Well appearing. No respiratory difficulty HEENT: Normal Neck: Supple. no JVD. Carotids 2+ bilat; no bruits. No lymphadenopathy or thyromegaly appreciated. Cor: PMI nondisplaced. Irregular rate & rhythm. No rubs, gallops or murmurs. Lungs: Clear Abdomen: Soft, nontender, nondistended. No hepatosplenomegaly. No bruits or masses. Good bowel sounds. Extremities: No cyanosis, clubbing,  rash, edema Neuro: Alert & oriented x 3, cranial nerves grossly intact. moves all 4 extremities w/o difficulty. Affect pleasant.   Telemetry   Atrial fibrillation  Labs     Basic Metabolic Panel: No results for input(s): "NA", "K", "CL", "CO2", "GLUCOSE", "BUN", "CREATININE", "CALCIUM", "MG", "PHOS" in the last 168 hours.  Liver Function Tests: No results for input(s): "AST", "ALT", "ALKPHOS", "BILITOT", "PROT", "ALBUMIN" in the last 168 hours. No results for input(s): "LIPASE", "AMYLASE" in the last 168 hours. No results for input(s): "AMMONIA" in the last 168 hours.  CBC: No results for input(s): "WBC", "NEUTROABS", "HGB", "HCT", "MCV", "PLT" in the last 168 hours.  Cardiac Enzymes: No results for input(s): "CKTOTAL", "CKMB", "CKMBINDEX", "TROPONINI" in the last 168 hours.  BNP: BNP (last 3 results) Recent Labs    12/10/22 1010 01/09/23 1056 01/21/23 1519  BNP 158.5* 275.5* 138.6*    ProBNP (last 3 results) No results for input(s): "PROBNP" in the last 8760 hours.   CBG: No results for input(s): "GLUCAP" in the last 168 hours.  Coagulation Studies: No results for input(s): "LABPROT", "INR" in the last 72 hours.  Imaging: No results found.    Assessment/Plan   Plan today for TEE-guided DCCV   Marca Ancona, MD 03/08/2023, 12:14 PM  Advanced Heart Failure Team Pager [phone] (M-F; 7a - 5p)  Please contact [facility] for night-coverage after hours (4p -7a ) and weekends on [email or web address]

## 2023-03-08 NOTE — Progress Notes (Signed)
Patient arrived monitor showed sinus rhythm.  EKG done and confirmed SR.  Dr Shirlee Latch looked at EKG.  Ok to cancel procedure.

## 2023-03-13 ENCOUNTER — Encounter (HOSPITAL_COMMUNITY): Payer: 59 | Admitting: Cardiology

## 2023-03-20 ENCOUNTER — Other Ambulatory Visit (HOSPITAL_COMMUNITY): Payer: Self-pay | Admitting: Internal Medicine

## 2023-04-09 ENCOUNTER — Other Ambulatory Visit: Payer: Self-pay | Admitting: Endocrinology

## 2023-04-15 ENCOUNTER — Other Ambulatory Visit (HOSPITAL_COMMUNITY): Payer: Self-pay | Admitting: Internal Medicine

## 2023-04-19 ENCOUNTER — Other Ambulatory Visit (HOSPITAL_COMMUNITY): Payer: Self-pay | Admitting: Internal Medicine

## 2023-04-24 ENCOUNTER — Ambulatory Visit (INDEPENDENT_AMBULATORY_CARE_PROVIDER_SITE_OTHER): Payer: 59 | Admitting: Podiatry

## 2023-04-24 ENCOUNTER — Encounter: Payer: Self-pay | Admitting: Podiatry

## 2023-04-24 DIAGNOSIS — E1140 Type 2 diabetes mellitus with diabetic neuropathy, unspecified: Secondary | ICD-10-CM | POA: Diagnosis not present

## 2023-04-24 DIAGNOSIS — B351 Tinea unguium: Secondary | ICD-10-CM

## 2023-04-24 DIAGNOSIS — E1149 Type 2 diabetes mellitus with other diabetic neurological complication: Secondary | ICD-10-CM | POA: Diagnosis not present

## 2023-04-24 DIAGNOSIS — M79674 Pain in right toe(s): Secondary | ICD-10-CM | POA: Diagnosis not present

## 2023-04-24 DIAGNOSIS — M79675 Pain in left toe(s): Secondary | ICD-10-CM | POA: Diagnosis not present

## 2023-04-24 NOTE — Progress Notes (Signed)
Subjective:   Patient ID: [name] [name], male   DOB: 67 y.o.   MRN: [id number]   HPI Present stating long-term diabetic states he is in reasonably good control not great control and he has some tingling in both of his feet and he has severely elongated nailbeds 1-5 both feet that he cannot take care of.  Patient does not currently smoke likes to be active   Review of Systems  All other systems reviewed and are negative.       Objective:  Physical Exam Vitals and nursing note reviewed.  Constitutional:      Appearance: He is well-developed.  Pulmonary:     Effort: Pulmonary effort is normal.  Musculoskeletal:        General: Normal range of motion.  Skin:    General: Skin is warm.  Neurological:     Mental Status: He is alert.     Vascular status intact mild diminishment neurological sharp dull vibratory with severely elongated nailbeds 1-5 both feet thick and dystrophic.  Has had diabetes for around 15 years control is okay but not great     Assessment:  Moderate neurological diabetic condition with severe elongation mycotic nail infection with pain 1-5 both feet     Plan:  H&P reviewed and discussed daily inspections of his feet debrided nailbeds 1-5 both feet no intra genic bleeding and discussed the importance of good control to prevent any tingling burning but right now it is very mild so I do not recommend treatment unless it were to get worse and I did recommend better control of his diabetes     Patient

## 2023-04-29 ENCOUNTER — Other Ambulatory Visit (HOSPITAL_COMMUNITY): Payer: Self-pay | Admitting: Internal Medicine

## 2023-04-29 DIAGNOSIS — E785 Hyperlipidemia, unspecified: Secondary | ICD-10-CM

## 2023-04-29 DIAGNOSIS — I4819 Other persistent atrial fibrillation: Secondary | ICD-10-CM

## 2023-05-07 ENCOUNTER — Other Ambulatory Visit: Payer: Self-pay

## 2023-05-07 DIAGNOSIS — E1165 Type 2 diabetes mellitus with hyperglycemia: Secondary | ICD-10-CM

## 2023-05-07 MED ORDER — EMPAGLIFLOZIN 25 MG PO TABS: 25.0000 mg | ORAL_TABLET | Freq: Every day | ORAL | 3 refills | Status: DC

## 2023-05-20 ENCOUNTER — Telehealth (HOSPITAL_COMMUNITY): Payer: Self-pay | Admitting: Cardiology

## 2023-05-20 NOTE — Telephone Encounter (Signed)
Pt daughter called to report increase in SOB -no additional symptoms to provide as daughter is not with patient   Appt offered however daughter felt he needs sooner that what was offered, will contact PCP

## 2023-06-13 ENCOUNTER — Other Ambulatory Visit (HOSPITAL_COMMUNITY): Payer: Self-pay | Admitting: Cardiology

## 2023-06-13 ENCOUNTER — Other Ambulatory Visit: Payer: Self-pay | Admitting: "Endocrinology

## 2023-07-04 ENCOUNTER — Encounter: Payer: Self-pay | Admitting: "Endocrinology

## 2023-07-09 ENCOUNTER — Other Ambulatory Visit: Payer: Self-pay

## 2023-07-09 DIAGNOSIS — E1165 Type 2 diabetes mellitus with hyperglycemia: Secondary | ICD-10-CM

## 2023-07-09 MED ORDER — FREESTYLE LIBRE 3 READER DEVI: 1.0000 | 0 refills | Status: DC

## 2023-07-10 ENCOUNTER — Other Ambulatory Visit: Payer: Self-pay | Admitting: "Endocrinology

## 2023-07-15 ENCOUNTER — Other Ambulatory Visit (HOSPITAL_COMMUNITY): Payer: Self-pay | Admitting: Internal Medicine

## 2023-07-15 DIAGNOSIS — I2510 Atherosclerotic heart disease of native coronary artery without angina pectoris: Secondary | ICD-10-CM

## 2023-07-16 ENCOUNTER — Other Ambulatory Visit: Payer: Self-pay

## 2023-07-16 DIAGNOSIS — I5032 Chronic diastolic (congestive) heart failure: Secondary | ICD-10-CM

## 2023-07-29 ENCOUNTER — Other Ambulatory Visit: Payer: Self-pay

## 2023-07-29 DIAGNOSIS — I5032 Chronic diastolic (congestive) heart failure: Secondary | ICD-10-CM

## 2023-07-29 MED ORDER — TORSEMIDE 20 MG PO TABS: 40.0000 mg | ORAL_TABLET | Freq: Two times a day (BID) | ORAL | 4 refills | Status: DC

## 2023-07-29 MED ORDER — HYDRALAZINE HCL 50 MG PO TABS: 50.0000 mg | ORAL_TABLET | Freq: Two times a day (BID) | ORAL | 4 refills | Status: DC

## 2023-08-13 ENCOUNTER — Other Ambulatory Visit (HOSPITAL_COMMUNITY): Payer: Self-pay | Admitting: Cardiology

## 2023-08-13 ENCOUNTER — Other Ambulatory Visit: Payer: Self-pay | Admitting: "Endocrinology

## 2023-08-13 DIAGNOSIS — E1165 Type 2 diabetes mellitus with hyperglycemia: Secondary | ICD-10-CM

## 2023-08-22 DIAGNOSIS — I6502 Occlusion and stenosis of left vertebral artery: Secondary | ICD-10-CM | POA: Diagnosis not present

## 2023-08-22 DIAGNOSIS — E785 Hyperlipidemia, unspecified: Secondary | ICD-10-CM | POA: Diagnosis not present

## 2023-08-22 DIAGNOSIS — Z7901 Long term (current) use of anticoagulants: Secondary | ICD-10-CM | POA: Diagnosis not present

## 2023-08-22 DIAGNOSIS — Z87891 Personal history of nicotine dependence: Secondary | ICD-10-CM | POA: Diagnosis not present

## 2023-08-22 DIAGNOSIS — I6782 Cerebral ischemia: Secondary | ICD-10-CM | POA: Diagnosis not present

## 2023-08-22 DIAGNOSIS — I129 Hypertensive chronic kidney disease with stage 1 through stage 4 chronic kidney disease, or unspecified chronic kidney disease: Secondary | ICD-10-CM | POA: Diagnosis not present

## 2023-08-22 DIAGNOSIS — I499 Cardiac arrhythmia, unspecified: Secondary | ICD-10-CM | POA: Diagnosis not present

## 2023-08-22 DIAGNOSIS — R410 Disorientation, unspecified: Secondary | ICD-10-CM | POA: Diagnosis not present

## 2023-08-22 DIAGNOSIS — R4701 Aphasia: Secondary | ICD-10-CM | POA: Diagnosis not present

## 2023-08-22 DIAGNOSIS — R42 Dizziness and giddiness: Secondary | ICD-10-CM | POA: Diagnosis not present

## 2023-08-22 DIAGNOSIS — R7989 Other specified abnormal findings of blood chemistry: Secondary | ICD-10-CM | POA: Diagnosis not present

## 2023-08-22 DIAGNOSIS — Z7982 Long term (current) use of aspirin: Secondary | ICD-10-CM | POA: Diagnosis not present

## 2023-08-22 DIAGNOSIS — N189 Chronic kidney disease, unspecified: Secondary | ICD-10-CM | POA: Diagnosis not present

## 2023-08-22 DIAGNOSIS — G4489 Other headache syndrome: Secondary | ICD-10-CM | POA: Diagnosis not present

## 2023-08-22 DIAGNOSIS — Z79899 Other long term (current) drug therapy: Secondary | ICD-10-CM | POA: Diagnosis not present

## 2023-08-22 DIAGNOSIS — I4891 Unspecified atrial fibrillation: Secondary | ICD-10-CM | POA: Diagnosis not present

## 2023-08-22 DIAGNOSIS — R0682 Tachypnea, not elsewhere classified: Secondary | ICD-10-CM | POA: Diagnosis not present

## 2023-08-22 DIAGNOSIS — Z91148 Patient's other noncompliance with medication regimen for other reason: Secondary | ICD-10-CM | POA: Diagnosis not present

## 2023-08-22 DIAGNOSIS — I700 Atherosclerosis of aorta: Secondary | ICD-10-CM | POA: Diagnosis not present

## 2023-08-22 DIAGNOSIS — E1122 Type 2 diabetes mellitus with diabetic chronic kidney disease: Secondary | ICD-10-CM | POA: Diagnosis not present

## 2023-08-22 DIAGNOSIS — I639 Cerebral infarction, unspecified: Secondary | ICD-10-CM | POA: Diagnosis not present

## 2023-08-22 DIAGNOSIS — Z794 Long term (current) use of insulin: Secondary | ICD-10-CM | POA: Diagnosis not present

## 2023-08-22 DIAGNOSIS — I6523 Occlusion and stenosis of bilateral carotid arteries: Secondary | ICD-10-CM | POA: Diagnosis not present

## 2023-08-22 DIAGNOSIS — Z7984 Long term (current) use of oral hypoglycemic drugs: Secondary | ICD-10-CM | POA: Diagnosis not present

## 2023-08-22 DIAGNOSIS — Z743 Need for continuous supervision: Secondary | ICD-10-CM | POA: Diagnosis not present

## 2023-08-22 DIAGNOSIS — G459 Transient cerebral ischemic attack, unspecified: Secondary | ICD-10-CM | POA: Diagnosis not present

## 2023-08-22 DIAGNOSIS — G319 Degenerative disease of nervous system, unspecified: Secondary | ICD-10-CM | POA: Diagnosis not present

## 2023-08-22 DIAGNOSIS — R6889 Other general symptoms and signs: Secondary | ICD-10-CM | POA: Diagnosis not present

## 2023-08-22 DIAGNOSIS — R531 Weakness: Secondary | ICD-10-CM | POA: Diagnosis not present

## 2023-08-22 DIAGNOSIS — R2990 Unspecified symptoms and signs involving the nervous system: Secondary | ICD-10-CM | POA: Diagnosis not present

## 2023-08-23 DIAGNOSIS — R2990 Unspecified symptoms and signs involving the nervous system: Secondary | ICD-10-CM | POA: Diagnosis not present

## 2023-08-23 DIAGNOSIS — I639 Cerebral infarction, unspecified: Secondary | ICD-10-CM | POA: Diagnosis not present

## 2023-08-23 DIAGNOSIS — I6521 Occlusion and stenosis of right carotid artery: Secondary | ICD-10-CM | POA: Diagnosis not present

## 2023-08-23 DIAGNOSIS — I517 Cardiomegaly: Secondary | ICD-10-CM | POA: Diagnosis not present

## 2023-08-23 NOTE — Unmapped External Note (Signed)
 Speech Language Pathology  Clinical Swallow Evaluation (CPT: [phone])  [name] [name] 68 y.o.  At the request of Dr. Von, a Clinical Swallowing Evaluation (CSE) was conducted on 08/23/2023.  Treatment Diagnosis: Dysphagia, unspecified Pain:  no pain per pt report Patient Goal:  eat lunch Interpreter used?: Yes Family/Caregiver present for session: Yes Cultural/Spiritual Beliefs to Incorporate into Treatment Sessions:  No  Impression: Pt presents with functional oropharyngeal swallow, as best determined at bedside.  Pt edentulous, reports has dentures but does not wear with PO typically. Reports eats regular diet at baseline, only avoiding very hard foods like nuts. CN exam unremarkable, volitional cough strong. Pt tolerated trials of thin liquid, puree, and solid textures without obvious oral issues or overt s/s aspiration. He denies difficulty swallowing, denies globus sensation. Vocal quality remains consistent throughout evaluation. Based on clinical findings, skilled speech therapy services for dysphagia are not indicated at this time.  Note that pt is reporting 88% return to baseline regarding expressive language. Was functional for communication during today's evaluation. Primary complaint is slight delay in formulating expressive thoughts/ideas. Endorses ability to participate in conversations with family and medical staff. Recommend pt request SLE evaluation if he does not return to baseline within 1 week.   Recommendations: Diet: Regular with Thin Liquids Aspiration precautions:  General precautions: sit upright, slow rate, fully swallow before next bite/sip, only eat when awake/alert, sit upright during all PO (as close to 90 degrees as possible) Medications: whole with liquid  Additional Recommendations: Regular and thorough oral care, best practice is toothbrush and toothpaste Speech and Language evaluation if language skills do no return to baseline   Medical  History: Past and current medical history has been reviewed and can be found in patient's medical record.  Past Medical History:  Past Medical History:  Diagnosis Date  . A-fib (CMS/HCC)   . Cataract   . Diabetes mellitus (CMS/HCC)    Type 2 DM takes insulin  and pills  . Gout   . Hypertension   . Irregular heartbeat       Procedure: An oral mechanism evaluation was completed. Boluses were administered to assess swallowing physiology and aspiration risk. Test boluses were administered as indicated below.   Assessment: Current diet: Adult Diet- Regular Mental Status: Alert  Respiratory Status:  O2 Device: None (Room air)  Findings: Positioning: Upright in bed Oral hygiene: Good Dentition: Edentulous upper arch and Edentulous lower arch  Oral Motor Exam: Mandibular (V) Strength: WFL Mandibular ROM: WFL Facial (VII) Strength: WFL Facial ROM: WFL Lingual (XII) Strength: WFL Lingual ROM: WFL Palatal Elevation: WFL Voluntary Cough: Strong Vocal Characteristics: WFL  Test Boluses: Consistencies provided: Thin Liquids, Pureed, and Regular Solid Liquids provided via: Straw  Clinical Risk Factors for Aspiration/Dysphagia Observed: None   Prognosis: N/A  Re-Evaluate: ST to sign off at this time. Please contact SLP for re-eval if difficulty observed or if patient status changes.  Education:  Results of this evaluation were thoroughly discussed with the patient and family.  If you have any questions regarding this patient, please notify the active/available SLP on patient's treatment team.  Thank you for this referral.  Start Time: 1242 Stop Time: 1302 SLP Total Treatment Time: 20    SLP Eval Charges Eval Swallow Function [phone]) minutes: 20 Mins   Charges           08/23/2023   Code Description Service Provider Modifiers Quantity  [id number] Hc St Eval Oral & Pharyngeal Swallow Function Harlene Ned, SLP GN 1  Time of Service Note Type Status   None

## 2023-08-23 NOTE — Consults (Signed)
 [facility] Neurology  Hospital Consult Note  Referring Physician: Reta Peace, MD   Chief Complaint/Reason for Consult:  I have been asked to see the patient in neurological consultation to render advice and opinion regarding TIA.  HPI: [name] is a 68 y.o.  male with a history significant for A-fib, poorly controlled diabetes, gout, hypertension, medication noncompliance who presented with about 12 hours of confusion and word finding difficulty.  He reportedly awoke from a nap yesterday around 11 AM and was confused and unable to speak clearly.  His daughter who is on the phone reports both dysarthria and word salad.  He does not take his medications consistently.  Today he is reportedly much better than yesterday and is nearly at his baseline.  He denies chest pain, shortness of breath, palpitations, focal weakness or sensory changes, lightheadedness.   Past Medical History:  Diagnosis Date  . A-fib (CMS/HCC)   . Cataract   . Diabetes mellitus (CMS/HCC)    Type 2 DM takes insulin  and pills  . Gout   . Hypertension   . Irregular heartbeat     Past Surgical History:  Procedure Laterality Date  . GALLBLADDER SURGERY  2017   Procedure: GALLBLADDER SURGERY  . OTHER SURGICAL HISTORY     Procedure: OTHER SURGICAL HISTORY (stents)   Allergies  Allergen Reactions  . Ace Inhibitors Other (See Comments)    Pt unsure   . Unable To Assess Other (See Comments)    Pt unsure of ATB name - breaks out in hives   Prior to Admission medications   Medication Sig Start Date End Date Taking? Authorizing Provider  aspirin  81 mg chewable tablet Take 81 mg by mouth Once Daily. 09/12/16  Yes HISTORICAL PROVIDER, [facility]  atorvastatin  (LIPITOR ) 80 mg tablet Take 80 mg by mouth daily.   Yes HISTORICAL PROVIDER, [facility]  carvediloL  (COREG ) 12.5 mg tablet TAKE 1 TABLET BY MOUTH TWICE DAILY WITH MEALS *PATIENT NEEDS APPOINTMENT FOR FURTHER REFILLS* 08/15/23  Yes HISTORICAL  PROVIDER, [facility]  Eliquis  5 mg tab TAKE 1 TABLET BY MOUTH TWICE DAILY 09/24/19  Yes HISTORICAL PROVIDER, [facility]  hydrALAZINE  (APRESOLINE ) 50 mg tablet TAKE 1 TABLET BY MOUTH TWICE DAILY IN THE MORNING AND AT BEDTIME   Yes HISTORICAL PROVIDER, [facility]  insulin  glargine (Lantus  U-100 Insulin ) 100 unit/mL injection Inject 0.5 Units under the skin daily. 09/12/16  Yes HISTORICAL PROVIDER, [facility]  insulin  lispro (HumaLOG  KwikPen) 100 unit/mL KwikPen INJECT 20 UNITS SUBCUTANEOUSLY 15 MINUTES BEFORE BREAKFAST ONCE DAILY 07/16/23  Yes HISTORICAL PROVIDER, [facility]  isosorbide  mononitrate (IMDUR ) 120 mg 24 hr tablet TAKE 1 TABLET BY MOUTH ONCE DAILY 09/24/19  Yes HISTORICAL PROVIDER, [facility]  metFORMIN  (GLUCOPHAGE ) 1,000 mg tablet Take 1,000 mg by mouth daily with breakfast. 09/12/16  Yes HISTORICAL PROVIDER, [facility]  sotaloL  (BETAPACE ) 120 mg tablet TAKE 1 TABLET BY MOUTH TWICE DAILY 09/24/19  Yes HISTORICAL PROVIDER, [facility]  torsemide  (DEMADEX ) 20 mg tablet Take 40 mg by mouth 2 (two) times a day.   Yes HISTORICAL PROVIDER, [facility]  amLODIPine  (NORVASC ) 5 mg tablet TAKE 1 TABLET BY MOUTH ONCE DAILY 10/13/19   HISTORICAL PROVIDER, [facility]  eplerenone  (INSPRA ) 50 mg tablet Take 50 mg by mouth daily. 12/10/22   HISTORICAL PROVIDER, [facility]  ezetimibe  (ZETIA ) 10 mg tablet Take 1 tablet by mouth daily. 07/15/23   HISTORICAL PROVIDER, [facility]  furosemide  (LASIX ) 20 mg tablet Take 20 mg by mouth Once Daily.  09/12/16   HISTORICAL PROVIDER, [facility]  Jardiance  25 mg tab Take 1 tablet by mouth daily. 08/15/23   HISTORICAL PROVIDER, [facility]  nitroglycerin  (NITROSTAT ) 0.4 mg SL tablet DISSOLVE 1 TABLET UNDER THE TONGUE AS NEEDED FOR CHEST PAIN EVERY 5 MINUTES UP TO 3 TIMES. IF NO RELIEF CALL 911.    HISTORICAL PROVIDER, [facility]  potassium chloride  20 mEq ER tablet Take 20 mEq by mouth daily.    HISTORICAL PROVIDER, [facility]   spironolactone  (ALDACTONE ) 100 mg tablet Take 100 mg by mouth Once Daily. 09/12/16   HISTORICAL PROVIDER, [facility]   Family History  Problem Relation Name Age of Onset  . Diabetes Mother    . Cataracts Mother    . Hypertension Mother    . Hypertension Sister    . Hypertension Brother    . Cancer Neg Hx    . Glaucoma Neg Hx    . Macular degeneration Neg Hx    . Retinal detachment Neg Hx    . Strabismus Neg Hx    . Stroke Neg Hx          Scheduled Meds: amLODIPine , 5 mg, oral, Daily apixaban , 5 mg, oral, BID aspirin , 81 mg, oral, Daily atorvastatin , 80 mg, oral, Daily carvediloL , 12.5 mg, oral, BID with meals hydrALAZINE , 10 mg, intravenous, Once hydrALAZINE , 50 mg, oral, Q8H SCH insulin  aspart (NovoLOG )/insulin  lispro (HumaLOG ) injection (WF), 0-12 Units, subcutaneous, TID PC potassium chloride , 20 mEq, oral, Daily   PRN Meds: .  acetaminophen  .  dextrose  .  dextrose  .  nitroglycerin    Review of Systems Pertinent items are noted in HPI.   Objective: Temp:  [97.8 F (36.6 C)-99.1 F (37.3 C)] 98.2 F (36.8 C) Heart Rate:  [48-86] 86 Resp:  [13-24] 18 BP: (141-213)/(84-147) 154/89 O2 Device: None (Room air)     Intake/Output Summary (Last 24 hours) at 08/23/2023 1126 Last data filed at 08/22/2023 1907 Gross per 24 hour  Intake --  Output 1000 ml  Net -1000 ml    Wt Readings from Last 3 Encounters:  08/22/23 85.2 kg (187 lb 13.3 oz)      Physical Exam: GENERAL:  No acute distress.  EYES:   Pupils: pupils equally round, reactive to light.  ENT:   Throat: oropharynx clear.  Edentulous CARDIOVASCULAR:   Distal pulses intact RESPIRATORY:   Normal work of breathing  MENTAL STATUS EXAM:  Orientation: Alert and oriented to person, place and time. Cooperative, follows commands well. Memory: Recent and remote memory normal. Attention, Concentration: Attention span and concentration are normal.  Language: Speech is clear and language is normal.  Fund of  knowledge: Aware of current events, vocabulary appropriate for patient age.    CRANIAL NERVES:    CN 2 (Optic): Visual fields intact to confrontation CN 3,4,6 (EOM): Pupils equal and reactive to light. Full extraocular eye movement without nystagmus.  CN 5 (Trigeminal): Facial sensation is normal, no weakness of masticatory muscles.  CN 7 (Facial): No facial weakness or asymmetry.  CN 8 (Auditory): Auditory acuity grossly normal.  CN 9,10 (Glossophar): The uvula is midline, the palate elevates symmetrically.  CN 11 (spinal access): Normal sternocleidomastoid and trapezius strength.  CN 12 (Hypoglossal): The tongue is midline. No atrophy or fasciculations.    MOTOR: Muscle Strength: Strength - 5/5 and symmetric in the upper and lower extremities, no pronation or drift. Muscle Tone: Tone and muscle bulk are normal in the upper and lower extremities.    REFLEXES: DTRs - 2+  and symmetrical in all four extremities, plantar responses are flexor bilaterally.    COORDINATION: Intact finger-to-nose   SENSATION: Intact to light touch and temperature throughout   GAIT: Able to walk well    NIH Stroke Scale:   NIH Stroke Scale: 1   Labs: Results from last 7 days  Lab Units 08/22/23 1712  WHITE BLOOD CELL COUNT 10*3/uL 6.31  HEMOGLOBIN g/dL 81.5*  HEMATOCRIT % 44.2*  PLATELET COUNT 10*3/uL 119*  MEAN CORPUSCULAR VOLUME fL 87.2   Results from last 7 days  Lab Units 08/23/23 0017 08/22/23 1801  SODIUM mmol/L 134* 129*  POTASSIUM mmol/L 3.9 4.2  CHLORIDE mmol/L 100 93*  CO2 mmol/L 25 25  BUN mg/dL 14 17  CREATININE mg/dL 8.66* 8.53*  CALCIUM  mg/dL 9.2 9.5  MAGNESIUM  mg/dL  --  1.8*      Lab Results  Component Value Date   PROTIME 14.5 (H) 08/22/2023   INR 1.1 08/22/2023   PTT 31.2 08/22/2023   LDLCALC 159 (H) 08/22/2023   HDL 56 (L) 08/22/2023   HGBA1C 13.2 (H) 08/23/2023     Significant Diagnostic Studies: All images independently visualized.  US  Carotid Doppler  Bilateral  Preliminary Result by Delmar Fall Cupid Results In [id number] (01/10 9141)                                               Preliminary                                                    [facility]                                                  [facility]                                                  [facility]  Heart and                                                   Vascular                                                   [address]                                                  [place]                                                   [place] [address]                                        Carotid Ultrasound Report  Name  BROOKLYN, JEFF             Study Date  08-23-2023 08 22 AM  MRN  [id number]                      Patient Location  [facility]  DOB  23-May-1956                    Gender  Male                                  Height  72 in  Age  69 yrs                        Ethnicity  3                                  Weight  188 lb  Ordering Physician  VON, NAVNEET  Referring Physician  KUMAR, NAVNEET  Performed By  MTF^^^^  Interpretation Summary  Duplex imaging exam of the right internal carotid artery reveals mild   atherosclerosis with no  significantly elevated velocities consistent with mild stenosis, 1 to 39%.   Duplex imaging exam of  the right internal carotid artery reveals atherosclerosis and Doppler   abnormalities consistent with  moderate stenosis, 40-59%. Antegrade flow in the right vertebral artery.   Unable to identify the left  vertebral artery. Bilateral subclavian arterial signals are normal   consistent with no significant  arterial occlusion.  Procedure  Complete  bilateral carotid duplex study performed.  Measurements and Calculations                                           Right    No Laterality     Left      Unit          Prox CCA PSV                      55.3                     100.9      cm-sec          Prox CCA EDV                      6.1                       8.4       cm-sec          Mid CCA PSV                      -59.7                     -83.2      cm-sec          Mid CCA EDV                       -7.9                      -9.9      cm-sec          Dist CCA PSV                     -93.2                     -94.9      cm-sec          Dist CCA EDV                     -0.62                     -12.1      cm-sec          CCA                              -93.2                     -94.9      cm-sec          Prox ECA PSV                     -174.4                    -136.6     cm-sec          Prox ECA EDV                                                -  7.7      cm-sec          Bulb PSV                         -88.8                     -68.8      cm-sec          Bulb EDV                          -8.1                     -11.3      cm-sec          Prox ICA PSV                      49.6                     160.8      cm-sec          Prox ICA EDV                      11.4                      18.4      cm-sec          Mid ICA PSV                      -87.6                     -98.0      cm-sec          Mid ICA EDV                      -18.0                     -22.0      cm-sec          Dist ICA PSV                     -83.9                     -95.4      cm-sec          Dist ICA EDV                     -14.5                     -23.3      cm-sec          ICA ratio vel                    -87.6                     161.0      cm-sec          Vertebral A PSV                  125.4  cm-sec          Vertebral A EDV                   18.9                                cm-sec          ICA-CCA ratio                      0.94                      -1.7          Prox SCLA PSV                     98.1                     112.0      cm-sec  Right Findings  Moderate-severe atherosclerosis and possibly significant stenosis of the   right common carotid artery   not completely assessable with Doppler . Plaque is calcified . The right   external carotid artery  velocity is elevated. 1-39% stenosis of the right internal carotid artery.   Plaque is calcified .  Antegrade flow is noted in the right vertebral artery.  Left Findings  Moderate-severe atherosclerosis and possibly significant stenosis of the   left common carotid artery   not completely assessable with Doppler . Plaque is calcified . The left   external carotid artery  velocity is elevated. 40-59% stenosis of the left internal carotid artery.   Plaque is calcified . The  left vertebral artery could not be visualized.  ___________________________________________________________________________  ___  Electronically signed by   on   08-23-2023 08 58 AM    MRI Brain WO Contrast  Final Result by Delmar Herbert Winfred Booker Results In [place] [id number] (01/09 2008)  CLINICAL DATA:  Initial evaluation for acute expressive aphasia,  dizziness, confusion.    EXAM:  MRI HEAD WITHOUT CONTRAST    TECHNIQUE:  Multiplanar, multiecho pulse sequences of the brain and surrounding  structures were obtained without intravenous contrast.    COMPARISON:  Prior CT from earlier the same day.    FINDINGS:  Brain: Examination degraded by motion artifact.    Generalized age-related cerebral atrophy. Patchy T2/FLAIR  hyperintensity involving the periventricular deep white matter both  cerebral hemispheres as well as the pons, consistent with chronic  small vessel ischemic disease, moderately advanced in nature. No  evidence for acute or subacute ischemia. Gray-white matter  differentiation maintained. No recent chronic cortical infarction.  No acute or chronic  intracranial blood products.    No mass lesion, midline shift or mass effect. No hydrocephalus or  extra-axial fluid collection. Pituitary gland suprasellar region  within normal limits.    Vascular: Major intracranial vascular flow voids are maintained.    Skull and upper cervical spine: Craniocervical junction within  normal limits. Bone marrow signal intensity normal. No scalp soft  tissue abnormality.    Sinuses/Orbits: Globes and orbital soft tissues within normal  limits. Scattered mucosal thickening noted about the ethmoidal air  cells and maxillary sinuses. No mastoid effusion.    Other: None.    IMPRESSION:  1. No acute intracranial abnormality.  2. Age-related cerebral atrophy with moderate chronic microvascular  ischemic disease.      Electronically  Signed    By: Morene Hoard M.D.    On: 08/22/2023 20:08      CT Code Stroke W Perfusion  Final Result by [employer] Results In [place] [id number] (01/09 1717)  CLINICAL DATA:  Stroke suspected, weakness    EXAM:  CT HEAD WITHOUT CONTRAST    CT ANGIOGRAPHY HEAD AND NECK    CT PERFUSION BRAIN    TECHNIQUE:  Multidetector CT imaging of the head and neck was performed using  the standard protocol during bolus administration of intravenous  contrast. Multiplanar CT image reconstructions and MIPs were  obtained to evaluate the vascular anatomy. Carotid stenosis  measurements (when applicable) are obtained utilizing NASCET  criteria, using the distal internal carotid diameter as the  denominator.    Multiphase CT imaging of the brain was performed following IV bolus  contrast injection. Subsequent parametric perfusion maps were  calculated using RAPID software.    RADIATION DOSE REDUCTION: This exam was performed according to the  departmental dose-optimization program which includes automated  exposure control, adjustment of the mA and/or kV according to  patient size and/or use of iterative reconstruction  technique.    CONTRAST:  Omnipaque  350    COMPARISON:  CT head 06/02/2022, no prior CTA head and neck    FINDINGS:  CT HEAD FINDINGS    Brain: No evidence of acute infarction, hemorrhage, mass, mass  effect, or midline shift. No hydrocephalus or extra-axial  collection. Periventricular white matter changes, likely the sequela  of chronic small vessel ischemic disease.    Vascular: No hyperdense vessel.    Skull: Negative for fracture or focal lesion.    Sinuses/Orbits: Mucosal thickening in the right frontal sinus and  bilateral ethmoid air cells. Right maxillary mucous retention cyst.  No acute finding in the orbits.    Other: The mastoid air cells are well aerated.    ASPECTS [facility] Stroke Program Early CT Score)    - Ganglionic level infarction (caudate, lentiform nuclei, internal  capsule, insula, M1-M3 cortex): 7    - Supraganglionic infarction (M4-M6 cortex): 3    Total score (0-10 with 10 being normal): 10    CTA NECK FINDINGS    Aortic arch: Standard branching. Imaged portion shows no evidence of  aneurysm or dissection. No significant stenosis of the major arch  vessel origins. Aortic atherosclerosis.    Right carotid system: No evidence of dissection, occlusion, or  hemodynamically significant stenosis (greater than 50%).  Atherosclerotic disease at the bifurcation, in the proximal ICA, and  in the mid ICA is not hemodynamically significant.    Left carotid system: Approximately 50% stenosis in the proximal left  ICA (series 14, image 232). No evidence of dissection or occlusion.    Vertebral arteries: The left vertebral artery is diminutive and  demonstrates multifocal moderate to severe stenosis in the left V1  and V2 segment, with near complete occlusion in the left V1 segment  (series 14, image 156). Nonopacification of the left V3 segment. The  right vertebral artery is dominant and patent from its origin to the  skull base without significant  stenosis. No evidence of dissection.    Skeleton: No acute osseous abnormality. Degenerative changes in the  cervical spine. Edentulous.    Other neck: No acute finding.    Upper chest: No focal pulmonary opacity or pleural effusion. The  main pulmonary artery measures up to 3.1 cm, above the upper limit  normal.    Review of the MIP images  confirms the above findings    CTA HEAD FINDINGS    Anterior circulation: Both internal carotid arteries are patent to  the termini, with moderate stenosis near the right petrous-cavernous  junction and mild stenosis in the right cavernous segment.    A1 segments patent, hypoplastic on the right.. Normal anterior  communicating artery. Anterior cerebral arteries are patent to their  distal aspects without significant stenosis.    No M1 stenosis or occlusion. MCA branches perfused to their distal  aspects without significant stenosis.    Posterior circulation: The right vertebral artery is patent to the  vertebrobasilar junction. The left vertebral artery is not opacified  proximally, with retrograde opacification in the distal right V4,  which also supplies the right PICA. The left PICA is patent  proximally.    Basilar patent to its distal aspect without significant stenosis.  Superior cerebellar arteries patent proximally.    Patent P1 segments. PCAs perfused to their distal aspects without  significant stenosis. The bilateral posterior communicating arteries  are diminutive but patent.    Venous sinuses: As permitted by contrast timing, patent.    Anatomic variants: None significant.    Review of the MIP images confirms the above findings    CT Brain Perfusion Findings:    CBF (<30%) Volume: 0mL    Perfusion (Tmax>6.0s) volume:    Mismatch Volume:    Infarction Location:No infarct core. Area of suspected penumbra  encompasses the majority of both cerebral hemispheres, favored to be  artifactual or reflect  globally poor perfusion.    IMPRESSION:  1. No acute intracranial process. ASPECTS is 10.  2. No intracranial large vessel occlusion. Moderate stenosis in the  right ICA near the petrous-cavernous junction and mild stenosis in  the right cavernous segment.  3. Approximately 50% stenosis in the proximal left ICA.  4. Multifocal moderate to severe stenosis in the left V1 and V2  segments, with near complete occlusion in the left V1 segment.  Nonopacification of the left V3 segment and proximal V4 segment.  Retrograde opacification in the distal right V4, which also supplies  the right PICA.  5. No infarct core on CT perfusion. Area of suspected penumbra  encompasses the majority of both cerebral hemispheres, favored to be  artifactual or reflect globally poor perfusion.  6. Aortic atherosclerosis.  7. The main pulmonary artery measures up to 3.1 cm, as can be seen  in setting pulmonary hypertension.    Aortic Atherosclerosis (ICD10-I70.0).    Code stroke imaging results were communicated on 08/22/2023 at 4:58 pm  to provider YODER via telephone, who verbally acknowledged these  results.      Electronically Signed    By: Donald Campion M.D.    On: 08/22/2023 17:17         Impression: [name] is a 68 y.o.  male with the above history who presented with transient aphasia, dysarthria, confusion.  Overall his presentation is concerning for TIA versus hypertensive encephalopathy.    Principal Problem:   Stroke-like symptoms Active Problems:   HLD (hyperlipidemia)   DM (diabetes mellitus) (CMD)   Atrial fibrillation (CMD)   Recommendations: - Telemetry with q4hr neurochecks and vitals - Goal normotension by discharge - TTE wo bubble, alert neuro team with concerning findings - Otherwise no further workup is indicated - Secondary stroke prevention: continue home asa 81mg  chew daily and eliquis  5mg  bid along with high intensity statin, glucose control.  - Therapy  consults: No needs  at this time.  - DM: check A1c as above, ACHS FSBS, SSI for coverage - Neurology will sign off. We will remain available for further questions. Please call with any change in neurological status or new symptoms. If after 4 PM, please call [facility] to be connected to [facility] on-call physician  - Patient should follow up in within 2 weeks after discharge at [facility] (phone: [phone]). It is located at [address], [address], [address] / [place], [place] [address].   High MDM  Lang Joesph Chester, MD 12:22 PM [place] 08/23/2023

## 2023-08-24 DIAGNOSIS — R2990 Unspecified symptoms and signs involving the nervous system: Secondary | ICD-10-CM | POA: Diagnosis not present

## 2023-08-24 NOTE — Discharge Summary (Signed)
 ------------------------------------------------------------------------------- Attestation signed by Reta Peace, MD at 08/24/2023  2:02 PM I agree with residents assessment and discharge plan  -------------------------------------------------------------------------------    [facility] Discharge Summary  Name: [name] [name] MRN: [id number] Age: 68 yrs DOB: September 25, 1955  Admit date: 08/22/2023   Discharge date and time: 08/24/2023  Admitting Physician: Reta Peace, MD Discharge Physician: : Reta Peace, MD  Admission Diagnoses:  Stroke-like symptoms [R29.90] TIA (transient ischemic attack) [G45.9]   Discharge Diagnoses:  Stroke-like symptoms [R29.90] TIA (transient ischemic attack) [G45.9]  HTN T2DM Atrial fibrillation  Admission Condition: fair Discharged Condition: stable  Hospital Course:  For full details, please see H&P, progress notes, consult notes and ancillary notes. Briefly, [name] [name] is a 68 y.o. male with hypertension, atrial fibrillation on apixaban , hyperlipidemia, poorly controlled type 2 diabetes, CKD (baseline creatinine appears to be 1.4-1.6) who presented to the hospital due to an episode of expressive aphasia and confusion concerning for TIA.  His hospital course will be summarized in a problem-based approach below.  #Expressive aphasia: POA, resolved #Probable TIA versus hypertensive encephalopathy #Hypertension #Hyperlipidemia  Patient sought medical attention due to confusion and aphasia.  On admission, patient was afebrile but hypertensive.  Given concern for stroke, code stroke imaging was obtained.  Both the CT code stroke and MRI did not show any acute abnormalities to suggest a stroke.  However, imaging was notable for multifocal moderate-severe stenosis in the left V1 and V2 segments with near complete occlusion of the left V1 segment. Carotid Dopplers show moderate-severe atherosclerosis of the right and left  common carotid arteries.  Additionally, the MRI showed cerebral atrophy with moderate chronic microvascular ischemic changes.  Please see below for imaging interpretations.  Risk stratification labs were obtained and were notable for a TSH 2.2, A1c 13.2, total cholesterol 249, LDL 159, HDL 56.  Patient was evaluated by neurology, and they recommended acquisition of a TTE.  TTE was obtained and was notable for an LVEF of 60-65%.  As a result, they believed that the patient's presentation was consistent with TIA versus hypertensive encephalopathy.  As a result, they recommended continuing atorvastatin , aspirin , and apixaban .  Additionally, they recommended optimization of blood pressure.  Consequently, patient was initiated on losartan  in place of hydralazine , as losartan  will also yield renal protection in the setting of the patient's CKD.  On the day of discharge, patient had a normal neurological exam.  He will follow-up with neurology in the outpatient setting.   #Poorly controlled type 2 diabetes A1c on admission markedly elevated at 13.2 with reportedly poor insulin  adherence.  While in the hospital, patient was restarted on glargine and sliding scale.  He will be discharged with glargine 30 units, lispro sliding scale, and a glucometer.  The insulin  plan was discussed extensively with both the patient and his daughters.   #Atrial fibrillation Patient has a history of atrial fibrillation for which he takes apixaban  and both carvedilol  and sotalol .  It was unclear as to when the patient last took his sotalol  based on history and fill history.  As a result, cardiology was contacted with regards to the sotalol .  Following their discussion, they stated that it was reasonable to reinitiate without any close EKG monitoring.  As a result, patient will be reinitiated on his sotalol  and has been asked to continue it on discharge.    Discharge Follow-up Action Items: 1.  Please follow-up with PCP in 1-2  weeks.  PCP instructions: Please make sure that patient has a  reliable way to measure his blood sugars.  He would like a CGM. Obtain BMP/Mg given losartan  initiation. 2.  Please follow-up with neurology in 2 weeks.  They can be reached at [phone] 3.  Hospitalist at home instructions: Please measure blood pressure and adjust antihypertensive regimen as indicated.  Please review glucose logs and adjust insulin  regimen as needed.  Please obtain EKG to check on QTc interval given use of sotalol .  Please also ensure that patient knows to follow-up with PCP and neurology. 4.  Please see below for an updated list of medications.  Of note, patient will be taking losartan  25 mg daily in place of the previously prescribed hydralazine . 5.  Regarding insulin , patient to take glargine 30 units nightly.  Patient also to take lispro sliding scale (see patient instructions).    Patient's Ordered Code Status: Full Code  Consults: IP CONSULT TO HOSPITALIST IP CONSULT TO STROKE COORDINATOR/NAVIGATOR IP CONSULT TO NEUROLOGY IP CONSULT TO CARDIOLOGY  CBC:  Results from last 7 days  Lab Units 08/24/23 0307 08/22/23 1712  WHITE BLOOD CELL COUNT 10*3/uL 5.89 6.31  HEMOGLOBIN g/dL 83.2 81.5*  HEMATOCRIT % 51.0* 55.7*  PLATELET COUNT 10*3/uL 119* 119*   CBD:  Results from last 7 days  Lab Units 08/24/23 0307 08/22/23 1712  WHITE BLOOD CELL COUNT 10*3/uL 5.89 6.31  RED BLOOD CELL COUNT 10*6/uL 5.88 6.39*  HEMOGLOBIN g/dL 83.2 81.5*  HEMATOCRIT % 51.0* 55.7*  MEAN CORPUSCULAR VOLUME fL 86.8 87.2  MEAN CORPUSCULAR HEMOGLOBIN pg 28.5 28.9  MEAN CORPUSCULAR HEMOGLOBIN CONC g/dL 67.1* 66.8  RED CELL DISTRIBUTION WIDTH % 15.8 15.9  MEAN PLATELET VOLUME fL 8.9 9.0  PLATELET COUNT 10*3/uL 119* 119*  LYMPHOCYTES RELATIVE PERCENT %  --  31  NEUTROPHILS RELATIVE PERCENT %  --  54  MONOCYTES RELATIVE PERCENT %  --  11  EOSINOPHILS RELATIVE PERCENT %  --  4  BASOPHILS RELATIVE PERCENT %  --  1   NEUTROPHILS ABSOLUTE COUNT 10*3/uL  --  3.40  LYMPHOCYTES ABSOLUTE COUNT 10*3/uL  --  1.90  MONOCYTES ABSOLUTE COUNT 10*3/uL  --  0.70  EOSINOPHILS ABSOLUTE COUNT 10*3/uL  --  0.20  BASOPHILS ABSOLUTE COUNT 10*3/uL  --  0.00   CMP:  Results from last 7 days  Lab Units 08/24/23 0307 08/23/23 0017 08/22/23 1801  SODIUM mmol/L 132* 134* 129*  POTASSIUM mmol/L 3.8 3.9 4.2  CHLORIDE mmol/L 98 100 93*  CO2 mmol/L 25 25 25   BUN mg/dL 20 14 17   CREATININE mg/dL 8.63* 8.66* 8.53*  CALCIUM  mg/dL 8.9 9.2 9.5  MAGNESIUM  mg/dL 1.8*  --  1.8*  BILIRUBIN TOTAL mg/dL  --   --  1.2*  AST U/L  --   --  13  ALT U/L  --   --  9  TOTAL PROTEIN g/dL  --   --  7.9  ALBUMIN g/dL  --   --  3.9  ANION GAP mmol/L 9 9 11    Coags:   Results from last 7 days  Lab Units 08/22/23 1712  INR  1.1  APTT seconds 31.2     Transthoracic echo (TTE) complete  Final Result by Elspeth Lorrene Kitten, MD (01/10 1452)  Version  1                                                                                                          Study ID  [id number]                                                                     +--------------------------------------------------+                             +----------+                                                                                                                                                    [facility]                                                                                                                                 [facility]                                         +--------------------------------------------------+                                                                                                                                                   +----------+  [facility]   [address]  Transthoracic Echocardiogram Report  Name  VERGIL, BURBY                             Study Date  08-23-2023,   1  43 PM                   Height  72.01 in  MRN  [id number]                                      Patient Location    [facility]                       Weight  187.834 lb  DOB  1956/08/02  [id number]                        Birth Gender  Male                                   BSA  2.07 m  Age  43 Years                                       Ethnicity  3                                        BP  154 - 89 mmHg                                                                                                          HR  63 bpm  Reason For Study  Stroke  History  stroke  Ordering Physician  KUMAR, NAVNEET  Performed By  CORRINE RIGGS  Referring Physician  KUMAR, NAVNEET  PROCEDURE  A two-dimensional transthoracic echocardiogram with color flow and Doppler   was performed. Image  Quality  Fair.  There is severe concentric left ventricular hypertrophy.  The left ventricular size is normal.  Left ventricular systolic function is normal.  LV ejection fraction = 60-65%.  The left atrium is mildly to moderately dilated.  There is trace aortic regurgitation.  There is trace mitral regurgitation.  There is trivial pericardial effusion.  IVC size was normal.  The aortic sinus is normal size.  There is no comparison study available.  LEFT VENTRICLE  The left ventricular size is normal. There is severe concentric left   ventricular hypertrophy. LV  ejection fraction = 60-65%. Left ventricular systolic function is normal.  RIGHT VENTRICLE  The right ventricle is normal size.  LEFT ATRIUM  The left atrium is mildly to moderately dilated.  RIGHT ATRIUM  Right atrial size is normal.  AORTIC VALVE  The aortic valve is trileaflet. Aortic valve calcification. There is no   aortic stenosis. There is  trace aortic regurgitation.  MITRAL VALVE  There is  mild mitral valve thickening. There is mild mitral annular   calcification. There is trace  mitral regurgitation.  TRICUSPID VALVE  Structurally normal tricuspid valve.  PULMONIC VALVE  The pulmonic valve is normal in structure and function. There is no   pulmonic valvular regurgitation.  ARTERIES  The aortic sinus is normal size.  VENOUS  IVC size was normal.  EFFUSION  There is trivial pericardial effusion.  Misc  There is no comparison study available.  MMode-2D Measurements & Calculations  asc Aorta Diam  3.2 cm                IVSd  1.86 cm                            LA dim  4.1 cm           LA ESV  BP   83.4 ml  LA ESV Index  A2C   42.4 ml-m       LA ESV Index  A4C   37.8 ml-m         LA ESV Index  BP   40.3  ml-m          LVIDd  4.0 cm  LVIDs  2.9 cm                           LVOT diam  2.05 cm                       LVPWd  1.86 cm  Doppler Measurements & Calculations  Ao max PG  8.1 mmHg                    Ao V2 max  142.6 cm-sec                  LV V1 VTI  12.6 cm      MV dec time  0.19 sec  MV E max vel  71.3 cm-sec              PA max PG  7.2 mmHg                      SV index LVOT   20.1  ml-m              TR max PG  14.4 mmHg  TR max vel  189.6 cm-sec  Other Measurements & Calculations  BSA  2.07 m                           SV LVOT   41.6 ml  ___________________________________________________________________________  ___                                  MD Elspeth Lorrene Kitten, MD, [phone]          08-23-2023, 2  52 PM    US  Carotid Doppler Bilateral  Final Result by Jerilynn Cheryl Pouch, MD (01/11 1051)  [facility]                                                  [facility]                                                   [facility]                                                   [address]                                                  [place]                                                   [place] [address]  Carotid Ultrasound Report  Name  ABELARDO, SEIDNER             Study Date  08-23-2023 08 22 AM  MRN  [id number]                      Patient Location  [id number]  DOB  [birth year]                    Gender  Male                                  Height  72 in  Age  29 yrs                        Ethnicity  3                                  Weight  188 lb  Ordering Physician  VON, NAVNEET  Referring Physician  KUMAR, NAVNEET  Performed By  GLADIS DILLON  Interpretation Summary  Duplex imaging exam of the right internal carotid artery reveals mild   atherosclerosis with no  significantly elevated velocities consistent with mild stenosis, 1 to 39%.   Antegrade flow in the  right vertebral artery. Unable to identify the left vertebral artery.   Bilateral subclavian arterial  signals are normal consistent with no significant arterial occlusion.   Duplex imaging exam of the  left internal carotid artery reveals atherosclerosis and Doppler   abnormalities consistent with  moderate stenosis, 40-59%.  Procedure  Complete bilateral carotid duplex study performed.  Measurements and Calculations                                           Right    No Laterality     Left      Unit          Prox CCA PSV                      55.3                     100.9      cm-sec          Prox CCA EDV                      6.1                       8.4       cm-sec          Mid CCA PSV                      -59.7                     -83.2      cm-sec          Mid CCA EDV                       -7.9                       -  9.9      cm-sec          Dist CCA PSV                     -93.2                     -94.9      cm-sec          Dist CCA EDV                     -0.62                     -12.1      cm-sec          CCA                              -93.2                     -94.9      cm-sec          Prox ECA PSV                     -174.4                    -136.6     cm-sec          Prox ECA EDV                                                -7.7      cm-sec          Bulb PSV                         -88.8                     -68.8      cm-sec          Bulb EDV                          -8.1                     -11.3      cm-sec          Prox ICA PSV                      49.6                     160.8      cm-sec          Prox ICA EDV                      11.4                      18.4      cm-sec          Mid ICA PSV                      -  87.6                     -98.0      cm-sec          Mid ICA EDV                      -18.0                     -22.0      cm-sec          Dist ICA PSV                     -83.9                     -95.4      cm-sec          Dist ICA EDV                     -14.5                     -23.3      cm-sec          ICA ratio vel                    -87.6                     161.0      cm-sec          Vertebral A PSV                  125.4                                cm-sec          Vertebral A EDV                   18.9                                cm-sec          ICA-CCA ratio                     0.94                      -1.7          Prox SCLA PSV                     98.1                     112.0      cm-sec  Right Findings  Moderate-severe atherosclerosis and possibly significant stenosis of the   right common carotid artery   not completely assessable with Doppler . Plaque is calcified . The right   external carotid artery  velocity is elevated. 1-39% stenosis of the right internal carotid artery.   Plaque is calcified .  Antegrade flow is noted in  the right vertebral artery.  Left Findings  Moderate-severe atherosclerosis and possibly significant stenosis of the   left common carotid artery   not completely assessable with Doppler . Plaque  is calcified . The left   external carotid artery  velocity is elevated. 40-59% stenosis of the left internal carotid artery.   Plaque is calcified . The  left vertebral artery could not be visualized.  ___________________________________________________________________________  ___  Electronically signed by MD Jerilynn Cheryl Pouch, MD, [phone]   on     08-24-2023 10 51 AM    MRI Brain WO Contrast  Final Result by Delmar Herbert Winfred Booker Results In [place] [id number] (01/09 2008)  CLINICAL DATA:  Initial evaluation for acute expressive aphasia,  dizziness, confusion.    EXAM:  MRI HEAD WITHOUT CONTRAST    TECHNIQUE:  Multiplanar, multiecho pulse sequences of the brain and surrounding  structures were obtained without intravenous contrast.    COMPARISON:  Prior CT from earlier the same day.    FINDINGS:  Brain: Examination degraded by motion artifact.    Generalized age-related cerebral atrophy. Patchy T2/FLAIR  hyperintensity involving the periventricular deep white matter both  cerebral hemispheres as well as the pons, consistent with chronic  small vessel ischemic disease, moderately advanced in nature. No  evidence for acute or subacute ischemia. Gray-white matter  differentiation maintained. No recent chronic cortical infarction.  No acute or chronic intracranial blood products.    No mass lesion, midline shift or mass effect. No hydrocephalus or  extra-axial fluid collection. Pituitary gland suprasellar region  within normal limits.    Vascular: Major intracranial vascular flow voids are maintained.    Skull and upper cervical spine: Craniocervical junction within  normal limits. Bone marrow signal intensity normal. No scalp soft  tissue abnormality.    Sinuses/Orbits: Globes and  orbital soft tissues within normal  limits. Scattered mucosal thickening noted about the ethmoidal air  cells and maxillary sinuses. No mastoid effusion.    Other: None.    IMPRESSION:  1. No acute intracranial abnormality.  2. Age-related cerebral atrophy with moderate chronic microvascular  ischemic disease.      Electronically Signed    By: Morene Hoard M.D.    On: 08/22/2023 20:08      CT Code Stroke W Perfusion  Final Result by [employer] Results In Ken Caryl [id number] (01/09 1717)  CLINICAL DATA:  Stroke suspected, weakness    EXAM:  CT HEAD WITHOUT CONTRAST    CT ANGIOGRAPHY HEAD AND NECK    CT PERFUSION BRAIN    TECHNIQUE:  Multidetector CT imaging of the head and neck was performed using  the standard protocol during bolus administration of intravenous  contrast. Multiplanar CT image reconstructions and MIPs were  obtained to evaluate the vascular anatomy. Carotid stenosis  measurements (when applicable) are obtained utilizing NASCET  criteria, using the distal internal carotid diameter as the  denominator.    Multiphase CT imaging of the brain was performed following IV bolus  contrast injection. Subsequent parametric perfusion maps were  calculated using RAPID software.    RADIATION DOSE REDUCTION: This exam was performed according to the  departmental dose-optimization program which includes automated  exposure control, adjustment of the mA and/or kV according to  patient size and/or use of iterative reconstruction technique.    CONTRAST:  Omnipaque  350    COMPARISON:  CT head 06/02/2022, no prior CTA head and neck    FINDINGS:  CT HEAD FINDINGS    Brain: No evidence of acute infarction, hemorrhage, mass, mass  effect, or midline shift. No hydrocephalus or extra-axial  collection. Periventricular white matter changes, likely the sequela  of chronic small vessel ischemic  disease.    Vascular: No hyperdense vessel.    Skull: Negative for  fracture or focal lesion.    Sinuses/Orbits: Mucosal thickening in the right frontal sinus and  bilateral ethmoid air cells. Right maxillary mucous retention cyst.  No acute finding in the orbits.    Other: The mastoid air cells are well aerated.    ASPECTS [facility] Stroke Program Early CT Score)    - Ganglionic level infarction (caudate, lentiform nuclei, internal  capsule, insula, M1-M3 cortex): 7    - Supraganglionic infarction (M4-M6 cortex): 3    Total score (0-10 with 10 being normal): 10    CTA NECK FINDINGS    Aortic arch: Standard branching. Imaged portion shows no evidence of  aneurysm or dissection. No significant stenosis of the major arch  vessel origins. Aortic atherosclerosis.    Right carotid system: No evidence of dissection, occlusion, or  hemodynamically significant stenosis (greater than 50%).  Atherosclerotic disease at the bifurcation, in the proximal ICA, and  in the mid ICA is not hemodynamically significant.    Left carotid system: Approximately 50% stenosis in the proximal left  ICA (series 14, image 232). No evidence of dissection or occlusion.    Vertebral arteries: The left vertebral artery is diminutive and  demonstrates multifocal moderate to severe stenosis in the left V1  and V2 segment, with near complete occlusion in the left V1 segment  (series 14, image 156). Nonopacification of the left V3 segment. The  right vertebral artery is dominant and patent from its origin to the  skull base without significant stenosis. No evidence of dissection.    Skeleton: No acute osseous abnormality. Degenerative changes in the  cervical spine. Edentulous.    Other neck: No acute finding.    Upper chest: No focal pulmonary opacity or pleural effusion. The  main pulmonary artery measures up to 3.1 cm, above the upper limit  normal.    Review of the MIP images confirms the above findings    CTA HEAD FINDINGS    Anterior circulation: Both internal  carotid arteries are patent to  the termini, with moderate stenosis near the right petrous-cavernous  junction and mild stenosis in the right cavernous segment.    A1 segments patent, hypoplastic on the right.. Normal anterior  communicating artery. Anterior cerebral arteries are patent to their  distal aspects without significant stenosis.    No M1 stenosis or occlusion. MCA branches perfused to their distal  aspects without significant stenosis.    Posterior circulation: The right vertebral artery is patent to the  vertebrobasilar junction. The left vertebral artery is not opacified  proximally, with retrograde opacification in the distal right V4,  which also supplies the right PICA. The left PICA is patent  proximally.    Basilar patent to its distal aspect without significant stenosis.  Superior cerebellar arteries patent proximally.    Patent P1 segments. PCAs perfused to their distal aspects without  significant stenosis. The bilateral posterior communicating arteries  are diminutive but patent.    Venous sinuses: As permitted by contrast timing, patent.    Anatomic variants: None significant.    Review of the MIP images confirms the above findings    CT Brain Perfusion Findings:    CBF (<30%) Volume: 0mL    Perfusion (Tmax>6.0s) volume:    Mismatch Volume:    Infarction Location:No infarct core. Area of suspected penumbra  encompasses the majority of both cerebral hemispheres, favored to be  artifactual or reflect  globally poor perfusion.    IMPRESSION:  1. No acute intracranial process. ASPECTS is 10.  2. No intracranial large vessel occlusion. Moderate stenosis in the  right ICA near the petrous-cavernous junction and mild stenosis in  the right cavernous segment.  3. Approximately 50% stenosis in the proximal left ICA.  4. Multifocal moderate to severe stenosis in the left V1 and V2  segments, with near complete occlusion in the left V1 segment.   Nonopacification of the left V3 segment and proximal V4 segment.  Retrograde opacification in the distal right V4, which also supplies  the right PICA.  5. No infarct core on CT perfusion. Area of suspected penumbra  encompasses the majority of both cerebral hemispheres, favored to be  artifactual or reflect globally poor perfusion.  6. Aortic atherosclerosis.  7. The main pulmonary artery measures up to 3.1 cm, as can be seen  in setting pulmonary hypertension.    Aortic Atherosclerosis (ICD10-I70.0).    Code stroke imaging results were communicated on 08/22/2023 at 4:58 pm  to provider YODER via telephone, who verbally acknowledged these  results.      Electronically Signed    By: Donald Campion M.D.    On: 08/22/2023 17:17         Disposition: Home  Patient Instructions:    Discharge Medications     PAUSE taking these medications      Sig Disp Refill Start End  * insulin  lispro 100 unit/mL KwikPen Wait to take this until your doctor or other care provider tells you to start again. Please use as sliding scale Commonly known as: HumaLOG  KwikPen  INJECT 20 UNITS SUBCUTANEOUSLY 15 MINUTES BEFORE BREAKFAST ONCE DAILY You also have another medication with the same name that you may need to continue taking.   0        * * There are duplicate medications prescribed to the patient          New Medications      Sig Disp Refill Start End  * diabetic supplies, miscellan. Misc  Glucometer - Preferred brand, Daily, Check blood sugar before meals and at bedtime, ICD-10 Code: Type 2 - E11.9  1 each  0     * diabetic supplies, miscellan. Misc  Lancets, ACHS (before meals and at bedtime), ICD-10 Code: Type 2 - E11.9  120 each  0     * diabetic supplies, miscellan. Misc  Test Strips - Appropriate for meter, ACHS (before meals and at bedtime), ICD-10 Code: Type 2 - E11.9  120 each  0     losartan  25 mg tablet Commonly known as: COZAAR   Take 1 tablet (25 mg total)  by mouth daily.  30 tablet  0  August 25, 2023       * * There are duplicate medications prescribed to the patient          Modified Medications      Sig Disp Refill Start End  insulin  glargine 100 unit/mL injection Commonly known as: Lantus  U-100 Insulin  What changed:  how much to take when to take this  Inject 30 Units under the skin at bedtime.   0     * insulin  lispro 100 unit/mL injection Commonly known as: HumaLOG  What changed: Another medication with the same name was paused. Ask your nurse or doctor if you should take this medication.  Inject 0-12 Units under the skin 3 (three) times a day before meals.   0        * *  There are duplicate medications prescribed to the patient          Medications To Continue      Sig Disp Refill Start End  amLODIPine  5 mg tablet Commonly known as: NORVASC   TAKE 1 TABLET BY MOUTH ONCE DAILY   0     aspirin  81 mg chewable tablet  Take 81 mg by mouth Once Daily.   0     atorvastatin  80 mg tablet Commonly known as: LIPITOR   Take 80 mg by mouth daily.   0     carvediloL  12.5 mg tablet Commonly known as: COREG   TAKE 1 TABLET BY MOUTH TWICE DAILY WITH MEALS *PATIENT NEEDS APPOINTMENT FOR FURTHER REFILLS*   0     Eliquis  5 mg Tab Generic drug: apixaban   TAKE 1 TABLET BY MOUTH TWICE DAILY   0     eplerenone  50 mg tablet Commonly known as: INSPRA   Take 50 mg by mouth daily.   0     ezetimibe  10 mg tablet Commonly known as: ZETIA   Take 1 tablet by mouth daily.   0     isosorbide  mononitrate 120 mg 24 hr tablet Commonly known as: IMDUR   TAKE 1 TABLET BY MOUTH ONCE DAILY   0     Jardiance  25 mg Tab Generic drug: empagliflozin   Take 1 tablet by mouth daily.   0     metFORMIN  1,000 mg tablet Commonly known as: GLUCOPHAGE   Take 1,000 mg by mouth daily with breakfast.   0     nitroglycerin  0.4 mg SL tablet Commonly known as: NITROSTAT   DISSOLVE 1 TABLET UNDER THE TONGUE AS NEEDED FOR CHEST PAIN EVERY  5 MINUTES UP TO 3 TIMES. IF NO RELIEF CALL 911.   0     potassium chloride  20 mEq ER tablet  Take 20 mEq by mouth daily.   0     sotaloL  120 mg tablet Commonly known as: BETAPACE   TAKE 1 TABLET BY MOUTH TWICE DAILY   0     torsemide  20 mg tablet Commonly known as: DEMADEX   Take 40 mg by mouth 2 (two) times a day.   0         Stopped Medications    furosemide  20 mg tablet Commonly known as: LASIX    hydrALAZINE  50 mg tablet Commonly known as: APRESOLINE    spironolactone  100 mg tablet Commonly known as: ALDACTONE         Discharge Orders     Ambulatory referral to Telehospitalist     Full Code     Lifting Limits:     Details:    Lifting Limits: No lifting limits   Ambulatory referral to Neurology     Details:    Reaon for Referral: Stroke   Comments: 2 weeks for TIA follow up       Follow-up  No future appointments.   Electronically signed by: Franky Carlin Courts, MD 08/24/2023  *Some images could not be shown.

## 2023-08-26 ENCOUNTER — Ambulatory Visit: Payer: Self-pay | Admitting: Family Medicine

## 2023-08-26 NOTE — Telephone Encounter (Deleted)
 Reason for Disposition . Taking prescription medication that could cause nausea (e.g., narcotics/opiates, antibiotics, OCPs, many others)  Answer Assessment - Initial Assessment Questions 1. NAUSEA SEVERITY: How bad is the nausea? (e.g., mild, moderate, severe; dehydration, weight loss)    - MODERATE: decreased oral intake without significant weight loss, dehydration, or malnutrition       2. ONSET: When did the nausea begin?      Ongoing  since leaving the hospital 3. VOMITING: Any vomiting? If Yes, ask: How many times today?     Denies 4. RECURRENT SYMPTOM: Have you had nausea before? If Yes, ask: When was the last time? What happened that time?      He was having  prior to going to Hospital. Family thinks it could be the combination of medication that he takes 11 pill ins in the morning 5. CAUSE: What do you think is causing the nausea?      Medication combination  Protocols used: Nausea-A-AH

## 2023-08-26 NOTE — Telephone Encounter (Addendum)
 Copied from CRM [id number]. Topic: Clinical - Red Word Triage >> Aug 26, 2023  1:47 PM Susanna ORN wrote: Red Word that prompted transfer to Nurse Triage: Patient's daughter, [name] Quest, is stating her father just got out of hospital on Saturday & he's having severe nausea.  Chief Complaint:    Patient's daughter, [name] Quest, reports  her father just got out of hospital on Saturday & he's having severe nausea. He is able to drink fluids and he is currently urinating.  Daughter state he was dealing with nausea while in the hospital.  When triaging  some mention of his eyes not being the same color. Daughter states his Bilirubin was 1.2 .  She clarified with the other sister as they are all present. Patient Blood Sugar is 140 currently.  Temp 98.4 He is  already scheduled for post hospital for 09/05/23 since he only wants to see Dr Mercer. [phone] Symptoms: Moderate Nauseated Concern he doesn't want to eat be cause of the nausea. Frequency: Since  Pertinent Negatives: Patient denies vomiting . Denies Diarrhea. Denies Fever. Disposition: [] ED /[] Urgent Care (no appt availability in office) / [] Appointment(In office/virtual)/ []  [place] [facility]/ [] Home Care/ [] Refused Recommended Disposition /[] [facility] [facility]/ [x]  Follow-up with PCP Additional Notes:  Family asking for nausea medication and to have records reviewed to see if he is ok  with waiting until 09/05/2023.  Reason for Disposition  Taking prescription medication that could cause nausea (e.g., narcotics/opiates, antibiotics, OCPs, many others)  Answer Assessment - Initial Assessment Questions 1. NAUSEA SEVERITY: How bad is the nausea? (e.g., mild, moderate, severe; dehydration, weight loss)    - MODERATE: decreased oral intake without significant weight loss, dehydration, or malnutrition       2. ONSET: When did the nausea begin?      Ongoing  since leaving the hospital 3. VOMITING: Any vomiting? If Yes, ask: How  many times today?     Denies 4. RECURRENT SYMPTOM: Have you had nausea before? If Yes, ask: When was the last time? What happened that time?      He was having  prior to going to Hospital. Family thinks it could be the combination of medication that he takes 11 pill ins in the morning 5. CAUSE: What do you think is causing the nausea?      Medication combination  Protocols used: Nausea-A-AH

## 2023-08-27 ENCOUNTER — Telehealth: Payer: Self-pay

## 2023-08-27 DIAGNOSIS — G459 Transient cerebral ischemic attack, unspecified: Secondary | ICD-10-CM | POA: Diagnosis not present

## 2023-08-27 DIAGNOSIS — I4891 Unspecified atrial fibrillation: Secondary | ICD-10-CM | POA: Diagnosis not present

## 2023-08-27 DIAGNOSIS — E1165 Type 2 diabetes mellitus with hyperglycemia: Secondary | ICD-10-CM | POA: Diagnosis not present

## 2023-08-27 DIAGNOSIS — Z7984 Long term (current) use of oral hypoglycemic drugs: Secondary | ICD-10-CM | POA: Diagnosis not present

## 2023-08-27 DIAGNOSIS — R2990 Unspecified symptoms and signs involving the nervous system: Secondary | ICD-10-CM | POA: Diagnosis not present

## 2023-08-27 DIAGNOSIS — E785 Hyperlipidemia, unspecified: Secondary | ICD-10-CM | POA: Diagnosis not present

## 2023-08-27 DIAGNOSIS — Z7901 Long term (current) use of anticoagulants: Secondary | ICD-10-CM | POA: Diagnosis not present

## 2023-08-27 DIAGNOSIS — I119 Hypertensive heart disease without heart failure: Secondary | ICD-10-CM | POA: Diagnosis not present

## 2023-08-27 DIAGNOSIS — Z794 Long term (current) use of insulin: Secondary | ICD-10-CM | POA: Diagnosis not present

## 2023-08-27 NOTE — Telephone Encounter (Signed)
 Copied from CRM [id number]. Topic: Clinical - Home Health Verbal Orders >> Aug 27, 2023  2:55 PM Thersia BROCKS wrote: Caller/Agency: Deane Rushing Number: [phone] Service Requested: Physical Therapy Frequency: 2 times a week for 3 weeks and 1 time a week for 1 week  Any new concerns about the patient? No

## 2023-08-28 ENCOUNTER — Telehealth (HOSPITAL_COMMUNITY): Payer: Self-pay | Admitting: Cardiology

## 2023-08-28 NOTE — Telephone Encounter (Signed)
 Daughter called with concerns  Recently discharged from [facility] hospital with stroke like symptoms Post hospital [facility] visit completed 1/14, EKG performed and [facility] provider concerned with prolonged QT . Provider reccommended stadolol   Before starting new meds family wanted to confirm with hf team   -copy of EKG unavailable  -follow up 1/28   Please advise

## 2023-08-29 NOTE — Telephone Encounter (Signed)
Daughter aware.

## 2023-08-30 DIAGNOSIS — Z87898 Personal history of other specified conditions: Secondary | ICD-10-CM | POA: Diagnosis not present

## 2023-08-30 NOTE — Telephone Encounter (Signed)
Spoke with [employer] and gave a VO for PT

## 2023-08-30 NOTE — Telephone Encounter (Signed)
Ok

## 2023-09-02 DIAGNOSIS — I4891 Unspecified atrial fibrillation: Secondary | ICD-10-CM | POA: Diagnosis not present

## 2023-09-03 DIAGNOSIS — I4891 Unspecified atrial fibrillation: Secondary | ICD-10-CM | POA: Diagnosis not present

## 2023-09-03 DIAGNOSIS — R2990 Unspecified symptoms and signs involving the nervous system: Secondary | ICD-10-CM | POA: Diagnosis not present

## 2023-09-03 DIAGNOSIS — Z7984 Long term (current) use of oral hypoglycemic drugs: Secondary | ICD-10-CM | POA: Diagnosis not present

## 2023-09-03 DIAGNOSIS — G459 Transient cerebral ischemic attack, unspecified: Secondary | ICD-10-CM | POA: Diagnosis not present

## 2023-09-03 DIAGNOSIS — Z7901 Long term (current) use of anticoagulants: Secondary | ICD-10-CM | POA: Diagnosis not present

## 2023-09-03 DIAGNOSIS — Z794 Long term (current) use of insulin: Secondary | ICD-10-CM | POA: Diagnosis not present

## 2023-09-03 DIAGNOSIS — E1165 Type 2 diabetes mellitus with hyperglycemia: Secondary | ICD-10-CM | POA: Diagnosis not present

## 2023-09-03 DIAGNOSIS — E785 Hyperlipidemia, unspecified: Secondary | ICD-10-CM | POA: Diagnosis not present

## 2023-09-03 DIAGNOSIS — I119 Hypertensive heart disease without heart failure: Secondary | ICD-10-CM | POA: Diagnosis not present

## 2023-09-05 ENCOUNTER — Ambulatory Visit (INDEPENDENT_AMBULATORY_CARE_PROVIDER_SITE_OTHER): Payer: 59 | Admitting: Family Medicine

## 2023-09-05 ENCOUNTER — Encounter: Payer: Self-pay | Admitting: Family Medicine

## 2023-09-05 VITALS — BP 146/76 | HR 52 | Temp 97.8°F | Ht 71.0 in | Wt 196.4 lb

## 2023-09-05 DIAGNOSIS — M5432 Sciatica, left side: Secondary | ICD-10-CM

## 2023-09-05 DIAGNOSIS — E1165 Type 2 diabetes mellitus with hyperglycemia: Secondary | ICD-10-CM | POA: Diagnosis not present

## 2023-09-05 DIAGNOSIS — I4891 Unspecified atrial fibrillation: Secondary | ICD-10-CM | POA: Diagnosis not present

## 2023-09-05 DIAGNOSIS — M5431 Sciatica, right side: Secondary | ICD-10-CM

## 2023-09-05 DIAGNOSIS — Z8673 Personal history of transient ischemic attack (TIA), and cerebral infarction without residual deficits: Secondary | ICD-10-CM | POA: Diagnosis not present

## 2023-09-05 DIAGNOSIS — Z794 Long term (current) use of insulin: Secondary | ICD-10-CM | POA: Diagnosis not present

## 2023-09-05 DIAGNOSIS — I10 Essential (primary) hypertension: Secondary | ICD-10-CM | POA: Diagnosis not present

## 2023-09-05 LAB — BASIC METABOLIC PANEL
BUN: 22 mg/dL (ref 6–23)
CO2: 29 meq/L (ref 19–32)
Calcium: 9.4 mg/dL (ref 8.4–10.5)
Chloride: 99 meq/L (ref 96–112)
Creatinine, Ser: 1.45 mg/dL (ref 0.40–1.50)
GFR: 49.2 mL/min — ABNORMAL LOW (ref >60.00)
Glucose, Bld: 296 mg/dL — ABNORMAL HIGH (ref 70–99)
Potassium: 3.6 meq/L (ref 3.5–5.1)
Sodium: 136 meq/L (ref 135–145)

## 2023-09-05 LAB — MICROALBUMIN / CREATININE URINE RATIO
Creatinine,U: 38.2 mg/dL
Microalb Creat Ratio: 9.6 mg/g (ref 0.0–30.0)
Microalb, Ur: 3.7 mg/dL — ABNORMAL HIGH (ref 0.0–1.9)

## 2023-09-05 LAB — MAGNESIUM: Magnesium: 2.5 mg/dL (ref 1.5–2.5)

## 2023-09-05 MED ORDER — LOSARTAN POTASSIUM 25 MG PO TABS: 25.0000 mg | ORAL_TABLET | Freq: Every day | ORAL | 3 refills | Status: DC

## 2023-09-05 MED ORDER — FREESTYLE LIBRE 3 READER DEVI: 1.0000 | 11 refills | Status: AC

## 2023-09-05 NOTE — Progress Notes (Signed)
Established Patient Office Visit   Subjective  Patient ID: [name] [name], male    DOB: 1956-03-22  Age: 68 y.o. MRN: [id number]  Chief Complaint  Patient presents with   Hospitalization Follow-up  Pt accompanied by his daughter.  HPI Pt is a 68 yo male seen for HFU.  Pt hospitalized 1/9-06/2024 for TIA, stroke like symptoms.  Pt presented with expressive aphasia and confusion.  Symptoms thought 2/2 TIA versus hypertensive encephalopathy as CT and MRI were negative for acute abnormality.  Imaging noted multifocal moderate-severe stenosis in the left V1 and V2 segments with near complete occlusion of the left P1 segment.  Carotid Dopplers with moderate-severe atherosclerosis of the right and left common carotid arteries.  MRI with cerebral atrophy with moderate chronic microvascular ischemic changes. Neurology recommended TTE.  TTE with EF 60-65%.  Patient advised to continue atorvastatin, apixaban, and aspirin.  Hydralazine DC'd and losartan started for renal protection due to CKD.  A1c was 13.2% on admission.  Patient discharged on glargine 30 units and sliding scale lispro.  Given CGM.  Patient with history of A-fib.  Advised to restart sotalol as does not appear he was taking based on fill history.  Advised to restart at discharge per cardiology.   Pt states he is doing well.  Had a hypoglycemic episode, bs 50s while in bathtub. Had an early dinner that night of fried ckn and jalapeno poppers.  Did not hear alarm on CGM as reader device was in another room.  Patient takes 1 unit of lispro if blood sugar 140-199, and so on by 1 unit.  Per patient he is not currently taking sotalol.  Has an upcoming cardiology appointment.  Has not had recent eye exam.  Previously seen by Dr. Nile Riggs.  Patient mentions pain in posterior LEs radiating from buttock down to knee. Pain making it uncomfortable to sit.  States started during hospitalization.  Patient denies weakness in bilateral LEs  Patient  Active Problem List   Diagnosis Date Noted   Wrist fracture, left, closed, initial encounter 06/05/2022   Rib pain 06/05/2022   Mobitz (type) I [facility]) atrioventricular block 01/23/2022   Chest pain of uncertain etiology 01/22/2022   Syncope 02/09/2020   Generalized weakness 06/27/2019   Acute on chronic congestive heart failure with left ventricular diastolic dysfunction (HCC) 06/26/2019   Anemia due to chronic kidney disease 06/26/2019   CKD (chronic kidney disease), stage III (HCC) 06/26/2019   Type 2 diabetes mellitus without complication (HCC) 06/26/2019   Iron deficiency anemia due to chronic blood loss 06/26/2019   History of atrial fibrillation    History of type 2 diabetes mellitus    Anemia    Chest pain 04/24/2019   Overweight (BMI 25.0-29.9) 02/28/2017   Unstable angina (HCC) 08/03/2016   S/P laparoscopic cholecystectomy 02/07/2016   Paroxysmal A-fib (HCC) 01/22/2016   Coronary artery disease involving native coronary artery with unstable angina pectoris (HCC) 12/29/2015   Diabetes (HCC) 11/12/2015   Chronic anticoagulation 08/22/2015   Atrial flutter with controlled response (HCC) 08/22/2015   RUQ pain    Cholelithiases- drain in place 06/04/2015   Essential hypertension    Chronic diastolic CHF (congestive heart failure) (HCC) 04/24/2015   Excessive daytime sleepiness 11/25/2014   Obstructive sleep apnea 11/25/2014   NICM (nonischemic cardiomyopathy) (HCC) 03/11/2014   Anticoagulation goal of INR 2 to 3 10/19/2013   Eczema 10/19/2013   Low libido 10/16/2013   Persistent atrial fibrillation (HCC) 10/06/2013   Rectal bleeding 12/18/2011  HYPERCHOLESTEROLEMIA 07/11/2010   DENTAL CARIES 06/30/2010   GOUT 05/05/2009   HEPATITIS B, CHRONIC 12/30/2006   OSTEOCHONDROMA 12/30/2006   Mitral valve disorder 12/30/2006   LIVER FUNCTION TESTS, ABNORMAL, HX OF 12/30/2006   History of colonic polyps 12/30/2006   COLONOSCOPY, HX OF 05/12/2001   Past Medical  History:  Diagnosis Date   Anxiety    CAD (coronary artery disease), native coronary artery    a. Nonobstructive CAD by cath 2013 - diffuse distal and branch vessel CAD, no severe disease in the major coronaries, LV mild global hypokinesis, EF 45%. b. ETT-Sestamibi 5/14: EF 31%, small fixed inferior defect with no ischemia.   Chronic CHF (HCC)    a. Mixed ICM/NICM (?EtOH). EF 35% in 2008. Echo 5/13: EF 60-65%, mod LVH, EF 45% on V gram in 12/2011. EF 12/2012: EF 50-55%, mild LVH, inferobasal HK, mild MR. ETT-Ses 5/14 EF 41%. Cardiac MRI 5/14: EF 44%, mild global HK, subepicardial delayed enhancement in nonspecific RV insertion pattern.   COLONIC POLYPS, HX OF 12/30/2006   Gout    H/O atrial flutter 08/13/2005   a. Ablations in 2007, 2008.   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   "left shoulder"   PAF (paroxysmal atrial fibrillation) (HCC)    On coumadin   Sleep apnea    "suppose to send mask but they never did" (05/03/2015)   Type II diabetes mellitus (HCC)    Past Surgical History:  Procedure Laterality Date   A FLUTTER ABLATION  2007, 2008   catheter ablation    CARDIAC CATHETERIZATION N/A 05/03/2015   Procedure: Right/Left Heart Cath and Coronary Angiography;  Surgeon: [name], MD;  Location: [facility] INVASIVE CV LAB;  Service: Cardiovascular;  Laterality: N/A;   CARDIAC CATHETERIZATION N/A 05/03/2015   Procedure: Coronary Stent Intervention;  Surgeon: Corky Crafts, MD;  Location: [facility] INVASIVE CV LAB;  Service: Cardiovascular;  Laterality: N/A;   CARDIAC CATHETERIZATION N/A 11/04/2015   Procedure: Left Heart Cath and Coronary  Angiography;  Surgeon: [name], MD;  Location: [facility] INVASIVE CV LAB;  Service: Cardiovascular;  Laterality: N/A;   CARDIOVERSION N/A 08/24/2015   Procedure: CARDIOVERSION;  Surgeon: Vesta Mixer, MD;  Location: [facility] ENDOSCOPY;  Service: Cardiovascular;  Laterality: N/A;   CARDIOVERSION N/A 12/14/2022   Procedure: CARDIOVERSION;  Surgeon: [name], MD;  Location: [facility] INVASIVE CV LAB;  Service: Cardiovascular;  Laterality: N/A;   CHOLECYSTECTOMY N/A 02/07/2016   Procedure: LAPAROSCOPIC CHOLECYSTECTOMY WITH  INTRAOPERATIVE CHOLANGIOGRAM;  Surgeon: Gaynelle Adu, MD;  Location: [facility] ORS;  Service: General;  Laterality: N/A;   COLONOSCOPY WITH PROPOFOL N/A 04/28/2019   Procedure: COLONOSCOPY WITH PROPOFOL;  Surgeon: Kerin Salen, MD;  Location: [facility] ENDOSCOPY;  Service: Gastroenterology;  Laterality: N/A;   CORONARY ANGIOPLASTY  12/05/01   ESOPHAGOGASTRODUODENOSCOPY (EGD) WITH PROPOFOL N/A 04/27/2019   Procedure: ESOPHAGOGASTRODUODENOSCOPY (EGD) WITH PROPOFOL;  Surgeon: Kerin Salen, MD;  Location: [facility] ENDOSCOPY;  Service: Gastroenterology;  Laterality: N/A;   GIVENS CAPSULE STUDY N/A 06/29/2019   Procedure: GIVENS CAPSULE STUDY;  Surgeon: Charlott Rakes, MD;  Location: [facility] ENDOSCOPY;  Service: Endoscopy;  Laterality: N/A;  LEFT HEART CATH AND CORONARY ANGIOGRAPHY N/A 12/28/2016   Procedure: Left Heart Cath and Coronary Angiography;  Surgeon: [name], MD;  Location: [facility] INVASIVE CV LAB;  Service: Cardiovascular;  Laterality: N/A;   LEFT HEART CATHETERIZATION WITH CORONARY ANGIOGRAM N/A 12/19/2011   Procedure: LEFT HEART CATHETERIZATION WITH CORONARY ANGIOGRAM;  Surgeon: [name], MD;  Location: [facility] CATH LAB;  Service: Cardiovascular;  Laterality: N/A;   OSTEOCHONDROMA EXCISION Left 1972   "took bone tumor off my shoulder"   POLYPECTOMY  04/28/2019   Procedure: POLYPECTOMY;  Surgeon: Kerin Salen, MD;  Location: [facility] ENDOSCOPY;  Service: Gastroenterology;;   PRESSURE SENSOR/CARDIOMEMS N/A  02/01/2020   Procedure: PRESSURE SENSOR/CARDIOMEMS;  Surgeon: [name], MD;  Location: [facility] INVASIVE CV LAB;  Service: Cardiovascular;  Laterality: N/A;   RIGHT HEART CATH N/A 02/01/2020   Procedure: RIGHT HEART CATH;  Surgeon: [name], MD;  Location: [facility] INVASIVE CV LAB;  Service: Cardiovascular;  Laterality: N/A;   RIGHT/LEFT HEART CATH AND CORONARY ANGIOGRAPHY N/A 07/01/2019   Procedure: RIGHT/LEFT HEART CATH AND CORONARY ANGIOGRAPHY;  Surgeon: [name], MD;  Location: [name] INVASIVE CV LAB;  Service: Cardiovascular;  Laterality: N/A;   TEE WITHOUT CARDIOVERSION N/A 12/14/2022   Procedure: TRANSESOPHAGEAL ECHOCARDIOGRAM;  Surgeon: [name], MD;  Location: [facility] INVASIVE CV LAB;  Service: Cardiovascular;  Laterality: N/A;   Social History   Tobacco Use   Smoking status: Former    Current packs/day: 0.00    Average packs/day: 4.0 packs/day for 18.0 years (72.0 ttl pk-yrs)    Types: Cigarettes    Start date: 08/13/1965    Quit date: 08/14/1983    Years since quitting: 40.0   Smokeless tobacco: Never  Vaping Use   Vaping status: Never Used  Substance Use Topics   Alcohol use: Yes    Alcohol/week: 16.0 standard drinks of alcohol    Types: 6 Cans of beer, 10 Shots of liquor per week    Comment: 2-3 beers watching sports; occasional glass of liquor; drinks heavily in football season   Drug use: Yes    Types: Marijuana    Comment: twice a week   Family History  Problem Relation Age of Onset   Diabetes Mother    Hypertension Mother    Heart attack Neg Hx    Stroke Neg Hx    Allergies  Allergen Reactions   Shrimp [Shellfish Allergy] Nausea And Vomiting   Ace Inhibitors Cough   Other Hives and Other (See Comments)    Patient reports developing hives after receiving "some antibiotic given in 1980''s at [facility]". He does not know which antibiotic.      ROS Negative unless stated above    Objective:     BP (!) 146/76 (BP Location: Left Arm,  Patient Position: Sitting, Cuff Size: Large)   Pulse (!) 52   Temp 97.8 F (36.6 C) (Oral)   Ht 5\' 11"  (1.803 m)   Wt 196 lb 6.4 oz (89.1 kg)   SpO2 97%   BMI 27.39 kg/m  BP Readings from Last 3 Encounters:  09/05/23 (!) 146/76  03/08/23 (!) 154/80  01/30/23 (!) 160/70   Wt Readings from Last 3 Encounters:  09/05/23 196 lb 6.4 oz (89.1 kg)  03/08/23 198 lb (89.8 kg)  01/30/23 209 lb (94.8 kg)      Physical Exam Constitutional:      General: He is not in acute distress.    Appearance: Normal appearance.  HENT:  Head: Normocephalic and atraumatic.     Nose: Nose normal.     Mouth/Throat:     Mouth: Mucous membranes are moist.  Cardiovascular:     Rate and Rhythm: Normal rate and regular rhythm.     Heart sounds: Normal heart sounds. No murmur heard.    No gallop.  Pulmonary:     Effort: Pulmonary effort is normal. No respiratory distress.     Breath sounds: Normal breath sounds. No wheezing, rhonchi or rales.  Musculoskeletal:     Comments: TTP of sciatic nerve b/l.  Skin:    General: Skin is warm and dry.  Neurological:     Mental Status: He is alert and oriented to person, place, and time. Mental status is at baseline.       09/05/2023   12:59 PM 11/16/2022    9:15 AM 07/23/2022    2:29 PM  Depression screen PHQ 2/9  Decreased Interest 0 0 0  Down, Depressed, Hopeless 0 0 0  PHQ - 2 Score 0 0 0  Altered sleeping 2 1   Tired, decreased energy 2 1   Change in appetite 0 2   Feeling bad or failure about yourself  0 1   Trouble concentrating 0 0   Moving slowly or fidgety/restless 0 0   Suicidal thoughts 0 0   PHQ-9 Score 4 5   Difficult doing work/chores Not difficult at all        09/05/2023   12:59 PM 11/16/2022    9:16 AM 09/29/2018   11:13 AM 07/08/2018    9:15 AM  GAD 7 : Generalized Anxiety Score  Nervous, Anxious, on Edge 0 0 1 0  Control/stop worrying 0 0 0 1  Worry too much - different things 2 0 0 1  Trouble relaxing 0 1 0 1  Restless 0 0  0 1  Easily annoyed or irritable 0 0 0 1  Afraid - awful might happen 0 1 1 1   Total GAD 7 Score 2 2 2 6   Anxiety Difficulty Not difficult at all Not difficult at all        No results found for any visits on 09/05/23.    Assessment & Plan:  History of TIA (transient ischemic attack)  Atrial fibrillation, unspecified type (HCC) -     Basic metabolic panel -     Magnesium  Type 2 diabetes mellitus with hyperglycemia, with long-term current use of insulin (HCC) -     Microalbumin / creatinine urine ratio -     FreeStyle Libre 3 Reader; 1 Device by Does not apply route continuous.  Dispense: 2 each; Refill: 11 -     Ambulatory referral to Ophthalmology  Bilateral sciatica  Essential hypertension -     Losartan Potassium; Take 1 tablet (25 mg total) by mouth daily.  Dispense: 90 tablet; Refill: 3  Patient seen status post hospitalization for TIA.  MRI head with multifocal moderate-severe stenosis in the left V1 and V2 segments with near complete occlusion of left P1 segment.  Carotid Dopplers with moderate-severe atherosclerosis of right and left carotid arteries.  Additionally cerebral atrophy with moderate chronic microvascular ischemic changes also seen.  TTE with EF 60-65%.  Total cholesterol 249, LDL 159, HDL 56.  Maximization of medical management advised.  Patient to continue atorvastatin 80 mg, Eliquis 5 mg twice daily, and aspirin for history of A-fib.  Patient was advised to restart sotalol per hospital notes though does not appear to be taking.  BP elevated during visit.  Recheck.  Hydralazine discontinued.  Continue losartan 25 mg daily.  Advised to keep appointment with cardiology and neurology.  Decrease sodium intake.  Discussed the importance of glycemic control.  Patient seems resistant to diet changes.  Declines referral to nutrition.  Continue Lantus 30 units and lispro sliding scale.  Patient advised to monitor blood sugar closely.  Use of CGM should help.  Refills  provided.  Advised to make endocrinology appointment.  Discussed stretching and other supportive care for bilateral sciatica.  Given strict precautions for continued or worsening symptoms.   On day of service, 43 minutes spent caring for this patient face-to-face, reviewing the chart, counseling and/or coordinating care for plan and treatment of diagnosis below.    Return in about 6 weeks (around 10/17/2023).   Deeann Saint, MD

## 2023-09-05 NOTE — Patient Instructions (Addendum)
Dont for get your neurology appt and cardiology appt.  A referral to the [facility] was placed.  They will call you about setting this up.  Check to see if you need any additional refills on meds so they can be sent to the pharmacy before your run out.

## 2023-09-06 ENCOUNTER — Encounter: Payer: Self-pay | Admitting: Family Medicine

## 2023-09-06 DIAGNOSIS — Z794 Long term (current) use of insulin: Secondary | ICD-10-CM | POA: Diagnosis not present

## 2023-09-06 DIAGNOSIS — G459 Transient cerebral ischemic attack, unspecified: Secondary | ICD-10-CM | POA: Diagnosis not present

## 2023-09-06 DIAGNOSIS — I119 Hypertensive heart disease without heart failure: Secondary | ICD-10-CM | POA: Diagnosis not present

## 2023-09-06 DIAGNOSIS — I4891 Unspecified atrial fibrillation: Secondary | ICD-10-CM | POA: Diagnosis not present

## 2023-09-06 DIAGNOSIS — E1165 Type 2 diabetes mellitus with hyperglycemia: Secondary | ICD-10-CM | POA: Diagnosis not present

## 2023-09-06 DIAGNOSIS — R2990 Unspecified symptoms and signs involving the nervous system: Secondary | ICD-10-CM | POA: Diagnosis not present

## 2023-09-06 DIAGNOSIS — Z7984 Long term (current) use of oral hypoglycemic drugs: Secondary | ICD-10-CM | POA: Diagnosis not present

## 2023-09-06 DIAGNOSIS — Z7901 Long term (current) use of anticoagulants: Secondary | ICD-10-CM | POA: Diagnosis not present

## 2023-09-06 DIAGNOSIS — E785 Hyperlipidemia, unspecified: Secondary | ICD-10-CM | POA: Diagnosis not present

## 2023-09-09 DIAGNOSIS — Z794 Long term (current) use of insulin: Secondary | ICD-10-CM | POA: Diagnosis not present

## 2023-09-09 DIAGNOSIS — E1165 Type 2 diabetes mellitus with hyperglycemia: Secondary | ICD-10-CM | POA: Diagnosis not present

## 2023-09-09 DIAGNOSIS — Z7984 Long term (current) use of oral hypoglycemic drugs: Secondary | ICD-10-CM | POA: Diagnosis not present

## 2023-09-09 DIAGNOSIS — I4891 Unspecified atrial fibrillation: Secondary | ICD-10-CM | POA: Diagnosis not present

## 2023-09-09 DIAGNOSIS — I119 Hypertensive heart disease without heart failure: Secondary | ICD-10-CM | POA: Diagnosis not present

## 2023-09-09 DIAGNOSIS — Z7901 Long term (current) use of anticoagulants: Secondary | ICD-10-CM | POA: Diagnosis not present

## 2023-09-09 DIAGNOSIS — G459 Transient cerebral ischemic attack, unspecified: Secondary | ICD-10-CM | POA: Diagnosis not present

## 2023-09-09 DIAGNOSIS — E785 Hyperlipidemia, unspecified: Secondary | ICD-10-CM | POA: Diagnosis not present

## 2023-09-09 DIAGNOSIS — R2990 Unspecified symptoms and signs involving the nervous system: Secondary | ICD-10-CM | POA: Diagnosis not present

## 2023-09-10 ENCOUNTER — Encounter (HOSPITAL_COMMUNITY): Payer: Self-pay | Admitting: Cardiology

## 2023-09-10 ENCOUNTER — Ambulatory Visit (HOSPITAL_COMMUNITY)
Admission: RE | Admit: 2023-09-10 | Discharge: 2023-09-10 | Disposition: A | Discharge status: Home / Self Care | Payer: 59 | Source: Ambulatory Visit | Attending: Cardiology | Admitting: Cardiology

## 2023-09-10 VITALS — BP 110/70 | HR 54 | Wt 200.8 lb

## 2023-09-10 DIAGNOSIS — E1122 Type 2 diabetes mellitus with diabetic chronic kidney disease: Secondary | ICD-10-CM | POA: Diagnosis not present

## 2023-09-10 DIAGNOSIS — I484 Atypical atrial flutter: Secondary | ICD-10-CM | POA: Diagnosis not present

## 2023-09-10 DIAGNOSIS — Z8679 Personal history of other diseases of the circulatory system: Secondary | ICD-10-CM | POA: Diagnosis not present

## 2023-09-10 DIAGNOSIS — N183 Chronic kidney disease, stage 3 unspecified: Secondary | ICD-10-CM | POA: Diagnosis not present

## 2023-09-10 DIAGNOSIS — Z7901 Long term (current) use of anticoagulants: Secondary | ICD-10-CM | POA: Diagnosis not present

## 2023-09-10 DIAGNOSIS — I25119 Atherosclerotic heart disease of native coronary artery with unspecified angina pectoris: Secondary | ICD-10-CM | POA: Insufficient documentation

## 2023-09-10 DIAGNOSIS — Z91148 Patient's other noncompliance with medication regimen for other reason: Secondary | ICD-10-CM | POA: Diagnosis not present

## 2023-09-10 DIAGNOSIS — Z794 Long term (current) use of insulin: Secondary | ICD-10-CM | POA: Insufficient documentation

## 2023-09-10 DIAGNOSIS — I480 Paroxysmal atrial fibrillation: Secondary | ICD-10-CM | POA: Insufficient documentation

## 2023-09-10 DIAGNOSIS — E785 Hyperlipidemia, unspecified: Secondary | ICD-10-CM | POA: Diagnosis not present

## 2023-09-10 DIAGNOSIS — F1090 Alcohol use, unspecified, uncomplicated: Secondary | ICD-10-CM | POA: Insufficient documentation

## 2023-09-10 DIAGNOSIS — Z7984 Long term (current) use of oral hypoglycemic drugs: Secondary | ICD-10-CM | POA: Insufficient documentation

## 2023-09-10 DIAGNOSIS — Z8673 Personal history of transient ischemic attack (TIA), and cerebral infarction without residual deficits: Secondary | ICD-10-CM | POA: Insufficient documentation

## 2023-09-10 DIAGNOSIS — Z79899 Other long term (current) drug therapy: Secondary | ICD-10-CM | POA: Diagnosis not present

## 2023-09-10 DIAGNOSIS — I5032 Chronic diastolic (congestive) heart failure: Secondary | ICD-10-CM | POA: Diagnosis not present

## 2023-09-10 DIAGNOSIS — Z955 Presence of coronary angioplasty implant and graft: Secondary | ICD-10-CM | POA: Diagnosis not present

## 2023-09-10 DIAGNOSIS — Z87891 Personal history of nicotine dependence: Secondary | ICD-10-CM | POA: Diagnosis not present

## 2023-09-10 DIAGNOSIS — I130 Hypertensive heart and chronic kidney disease with heart failure and stage 1 through stage 4 chronic kidney disease, or unspecified chronic kidney disease: Secondary | ICD-10-CM | POA: Diagnosis not present

## 2023-09-10 DIAGNOSIS — I4892 Unspecified atrial flutter: Secondary | ICD-10-CM

## 2023-09-10 DIAGNOSIS — G4733 Obstructive sleep apnea (adult) (pediatric): Secondary | ICD-10-CM | POA: Diagnosis not present

## 2023-09-10 LAB — COMPREHENSIVE METABOLIC PANEL
ALT: 48 U/L — ABNORMAL HIGH (ref 0–44)
AST: 46 U/L — ABNORMAL HIGH (ref 15–41)
Albumin: 3.3 g/dL — ABNORMAL LOW (ref 3.5–5.0)
Alkaline Phosphatase: 73 U/L (ref 38–126)
Anion gap: 9 (ref 5–15)
BUN: 20 mg/dL (ref 8–23)
CO2: 25 mmol/L (ref 22–32)
Calcium: 9 mg/dL (ref 8.9–10.3)
Chloride: 104 mmol/L (ref 98–111)
Creatinine, Ser: 1.49 mg/dL — ABNORMAL HIGH (ref 0.61–1.24)
GFR, Estimated: 50 mL/min — ABNORMAL LOW (ref >60)
Glucose, Bld: 175 mg/dL — ABNORMAL HIGH (ref 70–99)
Potassium: 3.7 mmol/L (ref 3.5–5.1)
Sodium: 138 mmol/L (ref 135–145)
Total Bilirubin: 0.7 mg/dL (ref 0.0–1.2)
Total Protein: 6.8 g/dL (ref 6.5–8.1)

## 2023-09-10 LAB — BRAIN NATRIURETIC PEPTIDE: B Natriuretic Peptide: 92.1 pg/mL (ref 0.0–100.0)

## 2023-09-10 LAB — TSH: TSH: 1.15 u[IU]/mL (ref 0.350–4.500)

## 2023-09-10 MED ORDER — AMIODARONE HCL 200 MG PO TABS: ORAL_TABLET | ORAL | 3 refills | Status: DC

## 2023-09-10 NOTE — Patient Instructions (Signed)
START Amiodarone 200 mg Twice daily for 10 days, then 200 mg daily.  Labs done today, your results will be available in MyChart, we will contact you for abnormal readings.  Your physician recommends that you schedule a follow-up appointment in: as scheduled   If you have any questions or concerns before your next appointment please send [place] a message through [place] or call our office at [phone].    TO LEAVE A MESSAGE FOR THE NURSE SELECT OPTION 2, PLEASE LEAVE A MESSAGE INCLUDING: YOUR NAME DATE OF BIRTH CALL BACK NUMBER REASON FOR CALL**this is important as we prioritize the call backs  YOU WILL RECEIVE A CALL BACK THE SAME DAY AS LONG AS YOU CALL BEFORE 4:00 PM  At the [facility], you and your health needs are our priority. As part of our continuing mission to provide you with exceptional heart care, we have created designated Provider Care Teams. These Care Teams include your primary Cardiologist (physician) and Advanced Practice Providers (APPs- Physician Assistants and Nurse Practitioners) who all work together to provide you with the care you need, when you need it.   You may see any of the following providers on your designated Care Team at your next follow up: Dr Arvilla Meres Dr Marca Ancona Dr. Dorthula Nettles Dr. Clearnce Hasten Amy Filbert Schilder, NP Robbie Lis, [place] [facility] [place], [place] [name], NP [place] Lee, NP Karle Plumber, PharmD   Please be sure to bring in all your medications bottles to every appointment.    Thank you for choosing [facility]

## 2023-09-10 NOTE — Progress Notes (Signed)
Patient ID: [name] [name], male   DOB: Feb 09, 1956, 68 y.o.   MRN: [id number] PCP: Deeann Saint, MD HF Cardiology: Dr. Shirlee Latch  Chief Complaint: Shortness of breath  68 y.o. with history of HTN, diabetes, paroxysmal atrial fibrillation, and CAD presents for cardiology followup.  He was admitted in 5/13 with chest pain with exertion.  LHC was done showing global HK with EF 45% and diffuse, severe distal and branch vessel disease.  There was no interventional option, but it was suspect that this disease could be causing his anginal-type pain.  Echo at that time was read as showing EF 60-65%.  He was admitted in 5/14 with hypertensive urgency and chest pain.  He ruled out for MI and BP was controlled.  His EF was 50-55% by echo.  ETT-Sestamibi done as outpatient showed small fixed inferior defect with no ischemia but EF was 31%.  He was quite hypertensive during the study.  Cardiac MRI done to confirm EF in 5/14 showed EF 44%.  He had an episode of atrial fibrillation/RVR in 3/15 but went back into NSR.  Echo in 2/16 showed EF 55-60% with moderate diastolic dysfunction.   He had a Cardiolite in 9/16 showing inferior ischemia.  He was having exertional dyspnea with less activity than normal.  There was concern for worsened CAD and he was sent for [facility] in 9/16. By RHC, filling pressures were not significantly elevated. He had a severe distal RCA stenosis that was treated with DES x 2.  Echo showed EF 55-60%.    In 10/16, he was admitted with acute cholecystitis.  He received a cholecystostomy tube.  Cholecystectomy done in 6/17.   Atrial fibrillation in 1/17, cardioverted to NSR.   Recurrent concerning chest pain in 3/17.  Repeat LHC showed diffuse distal and branch vessel disease consistent with poorly-controlled DM, but patent RCA stents.    He was admitted with atrial fibrillation in 12/17. Started on sotalol and converted to NSR.    He had recurrent chest pain in 5/18 with abnormal  Cardiolite.  Cath was done again in 5/18, showing diffuse CAD with no good interventional options.   Admitted 11/20 with increased dyspnea and exertional chest pain. Hgb went down to 8.1. GI consulted.  Had LHC that showed severe distal and branch vessel disease but no interventional targets and not good anatomy for CABG. Continued on medical management. Capsule endoscopy without recent or active bleeding, no AVMs. He went into atrial flutter during his hospitalization. He went back into NSR, however, after discharge.  +Snoring and daytime sleepiness.   He has had episodes of syncope associated with vigorous coughing.   He was in the ER with atrial flutter and dyspnea in 4/21. Zio monitor in 9/22 showed NSR with PACs.   Evaluated in ED 01/14/22 for cough, found to be COVID +. Received molnupiravir.  Admitted 01/22/22 with CP. Cards consulted, troponin negative x 2, Echo showed EF 55-60%, severe asymmetric LV hypertrophy, normal RV. No new WMA and CP resolved, so LHC deferred.  Coreg decreased to 12.5 bid due to low BP and Mobitz Type 1. Discharged home, weight 200 lbs.   cMRI 10/23 concerning for hypertrophic cardiomyopathy.  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  LMNA mutations can cause a HCM phenotype.   Scheduled for TEE/DCCV for recurrent atrial flutter in 5/24. LA appendage thrombus seen so DCCV aborted (noncompliant with Eliquis). He was brought back for repeat  TEE/DCCV in 7/24 after being compliant with Eliquis. He was in NSR when he came in for the procedure.   He was admitted in 1/25 at [facility] with expressive aphasia that resolved.  He had not been taking any medications except for occasionally Eliquis.  Imaging did not show CVA, TIA suspected. Echo showed EF 60-65% with severe LVH and normal RV. Carotid dopplers showed 40-59% LICA stenosis. LDL was 159 (not taking statin) and HgbA1c was 13.2 (not compliant with diabetes  regimen).   He returns today for followup of CHF with his daughter.  He says he is taking all his medications now (and his daughter confirms).  BP is controlled.  He is back in atypical atrial flutter today though he does not feel it (no palpitations, lightheadedness). He is not on sotalol => was not on it when he went to the hospital in [facility] and they did not restart it. He can walk around the block without dyspnea.  No chest pain. No orthopnea/PND.  He fatigues easily.    ECG (personally reviewed): atypical atrial flutter rate 60 bpm  Cardiomems: not using  Labs (11/20): K 3.5, creatinine 1.33, LDL 25 Labs (4/21): K 4.0, SCr 1.14, BNP 266  Labs (6/21): K 3.7, creatinine 1.25 Labs (12/21): LDL 25 Labs (1/22): BNP 132 Labs (2/22): K 3.4, creatinine 1.44 Labs (8/22): LDL 81 Labs (9/22): K 3.2, creatinine 1.53 Labs (10/22): K 3.7, creatinine 1.39 Labs (11/22): K 4.4, creatinine 1.47 Labs (3/23): K 4.4, creatinine 1.44 Labs (5/23): K 4.2, creatinine 1.78 Labs (6/23): K 4.1, creatinine 1.24, hgb 15.8 Labs (7/23): K 4.1, creatinine 1.55 Labs (10/23): K 3.7, creatinine 1.51 Labs (1/24): K 3.7, creatinine 1.64 Labs (5/24): K 2.8, SCr 2.01 Labs (7/24): LDL 48 Labs (1/25): K 3.6, creatinine 1.45, LDL 159  PMH: 1. DM2 2. Gout 3. HTN: Cough with ACEI.  4. Cardiomyopathy: Suspect mixed ischemic/nonischemic (?ETOH-related).  EF 35% in 2008.  Echo in 5/13 showed EF 60-65% with moderate LVH but EF was 45% on LV-gram in 5/13.  Echo (5/14) with EF 50-55%, mild LVH, inferobasal HK, mild MR.  ETT-Sestamibi in 5/14, however, showed EF 31%.  Cardiac MRI (6/14) with EF 44%, mild global hypokinesis, subepicardial delayed enhancement in a nonspecific RV insertion site pattern.  Echo (2/16) with EF 55-60%, moderate diastolic dysfunction. RHC (9/16) with mean RA 8, PA 39/20 mean 28, mean PCWP 14, CI 2.2. Echo (9/16) with EF 55-60%, grade II diastolic dysfunction.  - Echo (6/17) with EF 55-60%, moderate  LVH, grade II diastolic dysfunction, mild AI, mild MR.  - RHC (11/20): mean RA 4, PA 46/14 mean 26, mean PCWP 13, CI 3.23 - Echo (9/20): EF 60-65%, moderate LVH, normal RV size and systolic function, mild-moderate MR, mild AI.  - Echo (5/21): EF 55%, RV normal - Has Cardiomems.  - Echo (6/23): EF 55-60%, severe asymmetric LV hypertrophy, normal RV. - cMRI (11/23): LVEF 47%, no LVOT, moderate to severe asymmetric hypertrophy of the basal to mid inferoseptal and anteroseptum, RVEF 46%, ECV 34%, non-coronary LGE pattern most consistent with hypertrophic CM - Echo (1/25): EF 60-65%, severe LVH, normal RV 5. CAD: LHC in 2010 with mild nonobstructive disease.  LHC (5/13) with diffuse distal and branch vessel disease, mild global hypokinesis and EF 45%.  ETT-Sestamibi (5/14): EF 31%, small fixed inferior defect with no ischemia.  Lexiscan Cardiolite (9/16) with EF 26%, moderate inferior defect that was partially reversible, suggesting ischemia => High risk study. LHC (9/16) with 40-50% mLAD,  diffuse up to 50% distal LAD, 90% ostium of branch off ramus, 95% dRCA => DES to RCA x 2, EF 55-60%.   - LHC (3/17) with diffuse branch and distal vessel disease c/w poorly controlled DM, RCA stents patent.  - LHC (5/18): Diffuse CAD with no good interventional option. 60% mRCA, 90% dLAD, 50% serial mid ramus stenoses, ramus branches with ostial 80-90% stenoses. - LHC (11/20): Severe distal and branch vessel disease but no interventional targets and not good anatomy for CABG  6. H/o chronic HBV 7. Osteochondroma left shoulder.  8. Hyperlipidemia 9. Paroxysmal atrial fibrillation: Coumadin.  Developed cough and increased ESR with amiodarone.  DCCV in 1/17.  Recurrent atrial fibrillation in 12/17, sotalol started.  10. Atrial flutter: had ablations in 2007 and 2008.  - Zio patch in 4/21 with 20% atrial flutter.  11. OSA: Does not use CPAP.  12. Acute cholecystitis (10/16): Cholecystostomy tube placed.  Cholecystectomy  6/17.  13. CKD stage 3 14. Anemia - Suspected GI bleed but EGD/c-scope 9/20 without significant findings.  - Capsule Endoscopy (11/20) without recent or active bleeding, no AVMs.  15. Syncope: Suspected cough syncope.  16. OSA 17. TIA (1/25): Carotid dopplers (1/25) with 40-59% LICA stenosis.   SH: Married, prior smoker.  Some ETOH, occasionally heavy in past.  Does use occasional marijuana. Out of work Chiropractor. 2 daughters in grad school.   FH: CAD  ROS: All systems reviewed and negative except as per HPI.   Current Outpatient Medications  Medication Sig Dispense Refill   ACCU-CHEK SOFTCLIX LANCETS lancets Use as instructed for three times daily testing of blood glucose 100 each 12   acetaminophen (TYLENOL) 500 MG tablet Take 1,000 mg by mouth every 6 (six) hours as needed (pain).     albuterol (VENTOLIN HFA) 108 (90 Base) MCG/ACT inhaler Inhale 1-2 puffs into the lungs every 6 (six) hours as needed for wheezing or shortness of breath. 8 g 0   amiodarone (PACERONE) 200 MG tablet Take 1 tablet (200 mg total) by mouth 2 (two) times daily for 10 days, THEN 1 tablet (200 mg total) daily. 90 tablet 3   apixaban (ELIQUIS) 5 MG TABS tablet TAKE ONE TABLET BY MOUTH TWICE DAILY 180 tablet 3   atorvastatin (LIPITOR) 80 MG tablet TAKE 1 TABLET BY MOUTH ONCE DAILY 90 tablet 3   blood glucose meter kit and supplies KIT Dispense based on patient and insurance preference. Use up to four times daily as directed. (FOR ICD-9 250.00, 250.01). 1 each 0   Blood Glucose Monitoring Suppl (ACCU-CHEK AVIVA PLUS) w/Device KIT Use as directed for 3 times daily testing of blood glucose 1 kit 0   carvedilol (COREG) 12.5 MG tablet TAKE 1 TABLET BY MOUTH TWICE DAILY WITH MEALS *PATIENT NEEDS APPOINTMENT FOR FURTHER REFILLS* 180 tablet 0   carvedilol (COREG) 25 MG tablet Take 12.5 mg by mouth 2 (two) times daily with a meal.     colchicine 0.6 MG tablet On day 1 take 2 tablets at first sign of gout flare.  Take 1  tablet 1 hour later.  Then take 1 tablet daily starting the next day until resolution of gout flare. 10 tablet 1   Continuous Blood Gluc Receiver (FREESTYLE LIBRE 2 READER) DEVI Check your blood sugar three times per day before meals and once before bedtime 2 each 11   Continuous Glucose Receiver (FREESTYLE LIBRE 3 READER) DEVI 1 Device by Does not apply route continuous. 2 each 11   Continuous Glucose  Sensor (FREESTYLE LIBRE 3 SENSOR) MISC 1 Device by Does not apply route continuous. 6 each 0   empagliflozin (JARDIANCE) 25 MG TABS tablet TAKE 1 TABLET BY MOUTH DAILY 30 tablet 0   glucose blood (ACCU-CHEK AVIVA PLUS) test strip Use as instructed for 3 times daily testing of blood glucose 100 each 12   insulin glargine (LANTUS) 100 UNIT/ML injection Inject 30 Units into the skin daily.     insulin lispro (HUMALOG) 100 UNIT/ML injection Inject 1 Units into the skin 3 (three) times daily with meals. Sliding scale     Insulin Pen Needle (PEN NEEDLES) 32G X 4 MM MISC 1 each by Does not apply route daily. 100 each 3   isosorbide mononitrate (IMDUR) 120 MG 24 hr tablet TAKE ONE TABLET BY MOUTH ONCE DAILY 90 tablet 3   Lancet Devices (ACCU-CHEK SOFTCLIX) lancets Use as instructed for 3 times daily testing of blood glucose 1 each 0   losartan (COZAAR) 25 MG tablet Take 1 tablet (25 mg total) by mouth daily. 90 tablet 3   metFORMIN (GLUCOPHAGE) 1000 MG tablet TAKE 1 TABLET BY MOUTH ONCE DAILY WITH BREAKFAST *PATIENT NEEDS APPOINTMENT FOR FURTHER REFILLS* 30 tablet 10   nitroGLYCERIN (NITROSTAT) 0.4 MG SL tablet DISSOLVE 1 TABLET UNDER THE TONGUE AS NEEDED FOR CHEST PAIN EVERY 5 MINUTES UP TO 3 TIMES. IF NO RELIEF CALL 911. 25 tablet 10   potassium chloride SA (KLOR-CON M) 20 MEQ tablet TAKE 1 TABLET BY MOUTH ONCE DAILY 90 tablet 3   scopolamine (TRANSDERM-SCOP) 1 MG/3DAYS Apply topically.     torsemide (DEMADEX) 20 MG tablet Take 2 tablets (40 mg total) by mouth 2 (two) times daily. 120 tablet 4   TRUEPLUS  LANCETS 28G MISC 1 each by Does not apply route 3 (three) times daily. 100 each 12   sotalol (BETAPACE) 120 MG tablet TAKE 1 TABLET BY MOUTH TWICE DAILY (Patient not taking: Reported on 09/10/2023) 180 tablet 3   No current facility-administered medications for this encounter.   BP 110/70   Pulse (!) 54   Wt 91.1 kg (200 lb 12.8 oz)   SpO2 96%   BMI 28.01 kg/m    Wt Readings from Last 3 Encounters:  09/10/23 91.1 kg (200 lb 12.8 oz)  09/05/23 89.1 kg (196 lb 6.4 oz)  03/08/23 89.8 kg (198 lb)   PHYSICAL EXAM: General: NAD Neck: No JVD, no thyromegaly or thyroid nodule.  Lungs: Clear to auscultation bilaterally with normal respiratory effort. CV: Nondisplaced PMI.  Heart regular S1/S2, no S3/S4, no murmur.  No peripheral edema.  No carotid bruit.  Normal pedal pulses.  Abdomen: Soft, nontender, no hepatosplenomegaly, no distention.  Skin: Intact without lesions or rashes.  Neurologic: Alert and oriented x 3.  Psych: Normal affect. Extremities: No clubbing or cyanosis.  HEENT: Normal.   Assessment/Plan: 1. Chronic diastolic CHF: EF 29% on cardiac MRI in 6/14 but EF improved back to 55-60% on 2/16 echo.  However, EF down to 26% on Cardiolite in 9/16.  Echo in 9/20 showed EF 60-65%, moderate LVH, normal RV, mild-moderate MR.  Echo in 5/21 with EF 55%, normal RV.  He has a Cardiomems device, but has not used in months.  Echo in 6/23 showed EF 55-60% with severe asymmetric septal hypertrophy.  Cardiac MRI (10/23) showed LVEF 47%, no LVOT, moderate to severe asymmetric hypertrophy of the basal to mid inferoseptal and anteroseptum, RVEF 46%, ECV 34%, non-coronary LGE pattern most consistent with hypertrophic CM. 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  LMNA mutations can cause a HCM phenotype.  Echo (1/25) with EF 60-65%, severe LVH, normal RV.   NYHA class II symptoms, not volume overloaded.   - He is no longer taking  eplerenone, not sure why. Will hold off restarting.  - Continue torsemide 40 mg bid.  BMET/BNP today.   - Continue empagliflozin 10 mg daily. .  - Continue carvedilol 12.5 mg bid. - Continue losartan 25 mg daily.  - Will refer again to genetic evaluation with Dr. Sidney Ace given LMNA mutation that may be associated with HCM phenotype. We have tried several times to set up this appointment but to no avail.  He says he is willing to go.  His daughters will help facilitate.   - Discussed cardiomems usage, he says that he will restart.   2. Atrial fibrillation/flutter: Paroxysmal. He has had atrial flutter ablations x 2 remotely.  He is on sotalol. He tolerates atrial arrhythmias poorly, seems to worsen CHF.  Zio patch in 4/21 with 20% atrial flutter. He saw EP, repeat ablation was not recommended. Repeat Zio in 9/22 showed only NSR and PACs.  Sotalol stopped due to poor compliance.  He is back in atrial flutter today off sotalol.  - I am going to try him on amiodarone to maintain NSR => start 200 mg bid x 10 days then 200 mg daily after that.  Follow LFTs and TSH.   - Continue Eliquis - I will have him followup with APP in 2 wks.  If he is still in atrial flutter, would arrange for TEE-guided DCCV. Given poor compliance generally as well as history of LA appendage thrombus, he should get a TEE prior to DCCV regardless.   3. Coronary artery disease: LHC in 9/16 showed diffuse CAD, most significant involving the distal RCA.  Patient had DES x 2 to RCA.  Cath in 3/17 with patent RCA stents and diffuse distal and branch vessel disease concerning for poorly controlled diabetes.  Cath in 5/18 again showed diffuse CAD without good interventional target.  Cath in 11/20 showed severe distal and branch vessel disease but no interventional targets and not good anatomy for CABG.  No chest pain.  - No ASA, given Eliquis use.  - He has restarted atorvastatin 80 mg daily, was not taking up until recent hospital  admission. Check lipids in 2 months. - Continue Coreg and Imdur daily for angina control.   4. Hyperlipidemia:  restarted atorvastatin as above.  5. Hypertension: BP controlled.  6. OSA: He cannot tolerate CPAP.  7. CKD 3: BMET today.  8. DM: Has been poorly compliant. Followup with endocrinology.  9. ETOH use: Discussed limiting ETOH use. 10. Non-compliance: Chronic problem.  Long discussion today about the need to take his medications.  He seems motivated to improve.  11. TIA: TIA in 1/25, probably due to noncompliance with Eliquis.   F/u in 2 wks with APP to reassess rhythm, ?need for DCCV.    Marca Ancona 09/10/2023

## 2023-09-13 ENCOUNTER — Other Ambulatory Visit: Payer: Self-pay | Admitting: "Endocrinology

## 2023-09-13 DIAGNOSIS — E1165 Type 2 diabetes mellitus with hyperglycemia: Secondary | ICD-10-CM

## 2023-09-14 NOTE — Addendum Note (Signed)
Addended by: Deeann Saint on: 09/14/2023 04:53 PM   Modules accepted: Level of Service

## 2023-09-16 ENCOUNTER — Encounter: Payer: Self-pay | Admitting: *Deleted

## 2023-09-17 DIAGNOSIS — E1165 Type 2 diabetes mellitus with hyperglycemia: Secondary | ICD-10-CM | POA: Diagnosis not present

## 2023-09-17 DIAGNOSIS — I119 Hypertensive heart disease without heart failure: Secondary | ICD-10-CM | POA: Diagnosis not present

## 2023-09-17 DIAGNOSIS — E785 Hyperlipidemia, unspecified: Secondary | ICD-10-CM | POA: Diagnosis not present

## 2023-09-17 DIAGNOSIS — G459 Transient cerebral ischemic attack, unspecified: Secondary | ICD-10-CM | POA: Diagnosis not present

## 2023-09-17 DIAGNOSIS — R2990 Unspecified symptoms and signs involving the nervous system: Secondary | ICD-10-CM | POA: Diagnosis not present

## 2023-09-17 DIAGNOSIS — Z7984 Long term (current) use of oral hypoglycemic drugs: Secondary | ICD-10-CM | POA: Diagnosis not present

## 2023-09-17 DIAGNOSIS — I4891 Unspecified atrial fibrillation: Secondary | ICD-10-CM | POA: Diagnosis not present

## 2023-09-17 DIAGNOSIS — Z794 Long term (current) use of insulin: Secondary | ICD-10-CM | POA: Diagnosis not present

## 2023-09-17 DIAGNOSIS — Z7901 Long term (current) use of anticoagulants: Secondary | ICD-10-CM | POA: Diagnosis not present

## 2023-09-24 ENCOUNTER — Telehealth (HOSPITAL_COMMUNITY): Payer: Self-pay

## 2023-09-24 NOTE — Telephone Encounter (Signed)
Called patient to confirm appointment scheduled tomorrow here in the AHF clinic. Patient aware of appointment time, date, and location. Patient verified they will be at appointment.

## 2023-09-25 ENCOUNTER — Encounter (HOSPITAL_COMMUNITY): Payer: Self-pay

## 2023-09-25 ENCOUNTER — Ambulatory Visit (HOSPITAL_COMMUNITY)
Admission: RE | Admit: 2023-09-25 | Discharge: 2023-09-25 | Disposition: A | Discharge status: Home / Self Care | Payer: 59 | Source: Ambulatory Visit | Attending: Family Medicine | Admitting: Family Medicine

## 2023-09-25 VITALS — BP 144/68 | HR 63 | Wt 203.8 lb

## 2023-09-25 DIAGNOSIS — N183 Chronic kidney disease, stage 3 unspecified: Secondary | ICD-10-CM | POA: Diagnosis not present

## 2023-09-25 DIAGNOSIS — I130 Hypertensive heart and chronic kidney disease with heart failure and stage 1 through stage 4 chronic kidney disease, or unspecified chronic kidney disease: Secondary | ICD-10-CM | POA: Insufficient documentation

## 2023-09-25 DIAGNOSIS — Z91199 Patient's noncompliance with other medical treatment and regimen due to unspecified reason: Secondary | ICD-10-CM | POA: Diagnosis not present

## 2023-09-25 DIAGNOSIS — E118 Type 2 diabetes mellitus with unspecified complications: Secondary | ICD-10-CM

## 2023-09-25 DIAGNOSIS — G4733 Obstructive sleep apnea (adult) (pediatric): Secondary | ICD-10-CM | POA: Insufficient documentation

## 2023-09-25 DIAGNOSIS — I480 Paroxysmal atrial fibrillation: Secondary | ICD-10-CM | POA: Diagnosis not present

## 2023-09-25 DIAGNOSIS — F1010 Alcohol abuse, uncomplicated: Secondary | ICD-10-CM

## 2023-09-25 DIAGNOSIS — Z7984 Long term (current) use of oral hypoglycemic drugs: Secondary | ICD-10-CM | POA: Insufficient documentation

## 2023-09-25 DIAGNOSIS — I4892 Unspecified atrial flutter: Secondary | ICD-10-CM | POA: Insufficient documentation

## 2023-09-25 DIAGNOSIS — I5032 Chronic diastolic (congestive) heart failure: Secondary | ICD-10-CM | POA: Diagnosis not present

## 2023-09-25 DIAGNOSIS — Z8673 Personal history of transient ischemic attack (TIA), and cerebral infarction without residual deficits: Secondary | ICD-10-CM | POA: Insufficient documentation

## 2023-09-25 DIAGNOSIS — T50996A Underdosing of other drugs, medicaments and biological substances, initial encounter: Secondary | ICD-10-CM | POA: Insufficient documentation

## 2023-09-25 DIAGNOSIS — R079 Chest pain, unspecified: Secondary | ICD-10-CM | POA: Diagnosis present

## 2023-09-25 DIAGNOSIS — E119 Type 2 diabetes mellitus without complications: Secondary | ICD-10-CM | POA: Diagnosis present

## 2023-09-25 DIAGNOSIS — G459 Transient cerebral ischemic attack, unspecified: Secondary | ICD-10-CM | POA: Diagnosis not present

## 2023-09-25 DIAGNOSIS — I10 Essential (primary) hypertension: Secondary | ICD-10-CM

## 2023-09-25 DIAGNOSIS — Z7901 Long term (current) use of anticoagulants: Secondary | ICD-10-CM | POA: Insufficient documentation

## 2023-09-25 DIAGNOSIS — E1122 Type 2 diabetes mellitus with diabetic chronic kidney disease: Secondary | ICD-10-CM | POA: Insufficient documentation

## 2023-09-25 DIAGNOSIS — I25119 Atherosclerotic heart disease of native coronary artery with unspecified angina pectoris: Secondary | ICD-10-CM | POA: Insufficient documentation

## 2023-09-25 DIAGNOSIS — Z794 Long term (current) use of insulin: Secondary | ICD-10-CM | POA: Diagnosis not present

## 2023-09-25 DIAGNOSIS — Z79899 Other long term (current) drug therapy: Secondary | ICD-10-CM | POA: Diagnosis not present

## 2023-09-25 DIAGNOSIS — E785 Hyperlipidemia, unspecified: Secondary | ICD-10-CM | POA: Insufficient documentation

## 2023-09-25 DIAGNOSIS — I110 Hypertensive heart disease with heart failure: Secondary | ICD-10-CM | POA: Diagnosis present

## 2023-09-25 LAB — COMPREHENSIVE METABOLIC PANEL
ALT: 17 U/L (ref 0–44)
AST: 18 U/L (ref 15–41)
Albumin: 3.5 g/dL (ref 3.5–5.0)
Alkaline Phosphatase: 64 U/L (ref 38–126)
Anion gap: 13 (ref 5–15)
BUN: 15 mg/dL (ref 8–23)
CO2: 27 mmol/L (ref 22–32)
Calcium: 9.6 mg/dL (ref 8.9–10.3)
Chloride: 101 mmol/L (ref 98–111)
Creatinine, Ser: 1.48 mg/dL — ABNORMAL HIGH (ref 0.61–1.24)
GFR, Estimated: 51 mL/min — ABNORMAL LOW (ref >60)
Glucose, Bld: 159 mg/dL — ABNORMAL HIGH (ref 70–99)
Potassium: 3.8 mmol/L (ref 3.5–5.1)
Sodium: 141 mmol/L (ref 135–145)
Total Bilirubin: 0.7 mg/dL (ref 0.0–1.2)
Total Protein: 7.3 g/dL (ref 6.5–8.1)

## 2023-09-25 LAB — CBC
HCT: 51.5 % (ref 39.0–52.0)
Hemoglobin: 16.4 g/dL (ref 13.0–17.0)
MCH: 27 pg (ref 26.0–34.0)
MCHC: 31.8 g/dL (ref 30.0–36.0)
MCV: 84.8 fL (ref 80.0–100.0)
Platelets: 187 10*3/uL (ref 150–400)
RBC: 6.07 MIL/uL — ABNORMAL HIGH (ref 4.22–5.81)
RDW: 14.8 % (ref 11.5–15.5)
WBC: 6.7 10*3/uL (ref 4.0–10.5)
nRBC: 0 % (ref 0.0–0.2)

## 2023-09-25 MED ORDER — EPLERENONE 25 MG PO TABS: 25.0000 mg | ORAL_TABLET | Freq: Every day | ORAL | 8 refills | Status: DC

## 2023-09-25 NOTE — H&P (View-Only) (Signed)
 Patient ID: [name] [name], male   DOB: 11-Sep-1955, 68 y.o.   MRN: [id number] PCP: Deeann Saint, MD HF Cardiology: Dr. Shirlee Latch  Chief Complaint: HF follow up  68 y.o. with history of HTN, diabetes, paroxysmal atrial fibrillation, and CAD presents for cardiology followup.  He was admitted in 5/13 with chest pain with exertion.  LHC was done showing global HK with EF 45% and diffuse, severe distal and branch vessel disease.  There was no interventional option, but it was suspect that this disease could be causing his anginal-type pain.  Echo at that time was read as showing EF 60-65%.  He was admitted in 5/14 with hypertensive urgency and chest pain.  He ruled out for MI and BP was controlled.  His EF was 50-55% by echo.  ETT-Sestamibi done as outpatient showed small fixed inferior defect with no ischemia but EF was 31%.  He was quite hypertensive during the study.  Cardiac MRI done to confirm EF in 5/14 showed EF 44%.  He had an episode of atrial fibrillation/RVR in 3/15 but went back into NSR.  Echo in 2/16 showed EF 55-60% with moderate diastolic dysfunction.   He had a Cardiolite in 9/16 showing inferior ischemia.  He was having exertional dyspnea with less activity than normal.  There was concern for worsened CAD and he was sent for [facility] in 9/16. By RHC, filling pressures were not significantly elevated. He had a severe distal RCA stenosis that was treated with DES x 2.  Echo showed EF 55-60%.    In 10/16, he was admitted with acute cholecystitis.  He received a cholecystostomy tube.  Cholecystectomy done in 6/17.   Atrial fibrillation in 1/17, cardioverted to NSR.   Recurrent concerning chest pain in 3/17.  Repeat LHC showed diffuse distal and branch vessel disease consistent with poorly-controlled DM, but patent RCA stents.    He was admitted with atrial fibrillation in 12/17. Started on sotalol and converted to NSR.    He had recurrent chest pain in 5/18 with abnormal  Cardiolite.  Cath was done again in 5/18, showing diffuse CAD with no good interventional options.   Admitted 11/20 with increased dyspnea and exertional chest pain. Hgb went down to 8.1. GI consulted.  Had LHC that showed severe distal and branch vessel disease but no interventional targets and not good anatomy for CABG. Continued on medical management. Capsule endoscopy without recent or active bleeding, no AVMs. He went into atrial flutter during his hospitalization. He went back into NSR, however, after discharge.  +Snoring and daytime sleepiness.   He has had episodes of syncope associated with vigorous coughing.   He was in the ER with atrial flutter and dyspnea in 4/21. Zio monitor in 9/22 showed NSR with PACs.   Evaluated in ED 01/14/22 for cough, found to be COVID +. Received molnupiravir.  Admitted 01/22/22 with CP. Cards consulted, troponin negative x 2, Echo showed EF 55-60%, severe asymmetric LV hypertrophy, normal RV. No new WMA and CP resolved, so LHC deferred.  Coreg decreased to 12.5 bid due to low BP and Mobitz Type 1. Discharged home, weight 200 lbs.   cMRI 10/23 concerning for hypertrophic cardiomyopathy.  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  LMNA mutations can cause a HCM phenotype.   Scheduled for TEE/DCCV for recurrent atrial flutter in 5/24. LA appendage thrombus seen so DCCV aborted (noncompliant with Eliquis). He was brought back for repeat  TEE/DCCV in 7/24 after being compliant with Eliquis. He was in NSR when he came in for the procedure.   He was admitted in 1/25 at [facility] with expressive aphasia that resolved.  He had not been taking any medications except for occasionally Eliquis.  Imaging did not show CVA, TIA suspected. Echo showed EF 60-65% with severe LVH and normal RV. Carotid dopplers showed 40-59% LICA stenosis. LDL was 159 (not taking statin) and HgbA1c was 13.2 (not compliant with diabetes  regimen).   Post hospital follow up 1/25, he was in atrial flutter. Not on sotalol => was not on it when he went to the hospital in [facility] and they did not restart it. NYHA II and volume stable. He was loaded with amiodarone with plan to follow up in 2 weeks. If remained in AFL, arrange TEE-guided DCCV (given poor compliance and history of LA appendage thrombus).  Today he returns for HF follow up, with daughter present via phone. Overall feeling fair. He has SOB walking short distances on flat ground. He has had chest pain for past 3 days, mostly when "getting up and down". Exercise makes pain better. Feet are swelling and he has occasional dizziness, no falls. Denies palpitations, abnormal bleeding, or PND/Orthopnea. Chronically sleeps reclined. He is not using Cardiomems. He is not weighing at home. Taking all medications. He has CPAP but not wearing it. Drinks 1-2 beers/week, uses tylenol for chronic back pain.  ECG (personally reviewed): atypical atrial flutter rate 63 bpm  Cardiomems: not using  Labs (1/24): K 3.7, creatinine 1.64 Labs (5/24): K 2.8, creatinine 2.01 Labs (7/24): LDL 48 Labs (1/25): K 3.6, creatinine 1.45, LDL 159  PMH: 1. DM2 2. Gout 3. HTN: Cough with ACEI.  4. Cardiomyopathy: Suspect mixed ischemic/nonischemic (?ETOH-related).  EF 35% in 2008.  Echo in 5/13 showed EF 60-65% with moderate LVH but EF was 45% on LV-gram in 5/13.  Echo (5/14) with EF 50-55%, mild LVH, inferobasal HK, mild MR.  ETT-Sestamibi in 5/14, however, showed EF 31%.  Cardiac MRI (6/14) with EF 44%, mild global hypokinesis, subepicardial delayed enhancement in a nonspecific RV insertion site pattern.  Echo (2/16) with EF 55-60%, moderate diastolic dysfunction. RHC (9/16) with mean RA 8, PA 39/20 mean 28, mean PCWP 14, CI 2.2. Echo (9/16) with EF 55-60%, grade II diastolic dysfunction.  - Echo (6/17) with EF 55-60%, moderate LVH, grade II diastolic dysfunction, mild AI, mild MR.  - RHC (11/20): mean  RA 4, PA 46/14 mean 26, mean PCWP 13, CI 3.23 - Echo (9/20): EF 60-65%, moderate LVH, normal RV size and systolic function, mild-moderate MR, mild AI.  - Echo (5/21): EF 55%, RV normal - Has Cardiomems.  - Echo (6/23): EF 55-60%, severe asymmetric LV hypertrophy, normal RV. - cMRI (11/23): LVEF 47%, no LVOT, moderate to severe asymmetric hypertrophy of the basal to mid inferoseptal and anteroseptum, RVEF 46%, ECV 34%, non-coronary LGE pattern most consistent with hypertrophic CM - Echo (1/25): EF 60-65%, severe LVH, normal RV 5. CAD: LHC in 2010 with mild nonobstructive disease.  LHC (5/13) with diffuse distal and branch vessel disease, mild global hypokinesis and EF 45%.  ETT-Sestamibi (5/14): EF 31%, small fixed inferior defect with no ischemia.  Lexiscan Cardiolite (9/16) with EF 26%, moderate inferior defect that was partially reversible, suggesting ischemia => High risk study. LHC (9/16) with 40-50% mLAD, diffuse up to 50% distal LAD, 90% ostium of branch off ramus, 95% dRCA => DES to RCA x 2, EF 55-60%.   -  LHC (3/17) with diffuse branch and distal vessel disease c/w poorly controlled DM, RCA stents patent.  - LHC (5/18): Diffuse CAD with no good interventional option. 60% mRCA, 90% dLAD, 50% serial mid ramus stenoses, ramus branches with ostial 80-90% stenoses. - LHC (11/20): Severe distal and branch vessel disease but no interventional targets and not good anatomy for CABG  6. H/o chronic HBV 7. Osteochondroma left shoulder.  8. Hyperlipidemia 9. Paroxysmal atrial fibrillation: Coumadin.  Developed cough and increased ESR with amiodarone.  DCCV in 1/17.  Recurrent atrial fibrillation in 12/17, sotalol started.  10. Atrial flutter: had ablations in 2007 and 2008.  - Zio patch in 4/21 with 20% atrial flutter.  11. OSA: Does not use CPAP.  12. Acute cholecystitis (10/16): Cholecystostomy tube placed.  Cholecystectomy 6/17.  13. CKD stage 3 14. Anemia - Suspected GI bleed but EGD/c-scope  9/20 without significant findings.  - Capsule Endoscopy (11/20) without recent or active bleeding, no AVMs.  15. Syncope: Suspected cough syncope.  16. OSA 17. TIA (1/25): Carotid dopplers (1/25) with 40-59% LICA stenosis.   SH: Married, prior smoker.  Some ETOH, occasionally heavy in past.  Does use occasional marijuana. Out of work Chiropractor. 2 daughters in grad school.   FH: CAD  ROS: All systems reviewed and negative except as per HPI.   Current Outpatient Medications  Medication Sig Dispense Refill   ACCU-CHEK SOFTCLIX LANCETS lancets Use as instructed for three times daily testing of blood glucose 100 each 12   acetaminophen (TYLENOL) 500 MG tablet Take 1,000 mg by mouth every 6 (six) hours as needed (pain).     albuterol (VENTOLIN HFA) 108 (90 Base) MCG/ACT inhaler Inhale 1-2 puffs into the lungs every 6 (six) hours as needed for wheezing or shortness of breath. 8 g 0   amiodarone (PACERONE) 200 MG tablet Take 1 tablet (200 mg total) by mouth 2 (two) times daily for 10 days, THEN 1 tablet (200 mg total) daily. 90 tablet 3   apixaban (ELIQUIS) 5 MG TABS tablet TAKE ONE TABLET BY MOUTH TWICE DAILY 180 tablet 3   atorvastatin (LIPITOR) 80 MG tablet TAKE 1 TABLET BY MOUTH ONCE DAILY 90 tablet 3   blood glucose meter kit and supplies KIT Dispense based on patient and insurance preference. Use up to four times daily as directed. (FOR ICD-9 250.00, 250.01). 1 each 0   Blood Glucose Monitoring Suppl (ACCU-CHEK AVIVA PLUS) w/Device KIT Use as directed for 3 times daily testing of blood glucose 1 kit 0   carvedilol (COREG) 12.5 MG tablet TAKE 1 TABLET BY MOUTH TWICE DAILY WITH MEALS *PATIENT NEEDS APPOINTMENT FOR FURTHER REFILLS* 180 tablet 0   carvedilol (COREG) 25 MG tablet Take 12.5 mg by mouth 2 (two) times daily with a meal.     colchicine 0.6 MG tablet On day 1 take 2 tablets at first sign of gout flare.  Take 1 tablet 1 hour later.  Then take 1 tablet daily starting the next day until  resolution of gout flare. 10 tablet 1   Continuous Blood Gluc Receiver (FREESTYLE LIBRE 2 READER) DEVI Check your blood sugar three times per day before meals and once before bedtime 2 each 11   Continuous Glucose Receiver (FREESTYLE LIBRE 3 READER) DEVI 1 Device by Does not apply route continuous. 2 each 11   Continuous Glucose Sensor (FREESTYLE LIBRE 3 SENSOR) MISC 1 Device by Does not apply route continuous. 6 each 0   glucose blood (ACCU-CHEK AVIVA PLUS) test  strip Use as instructed for 3 times daily testing of blood glucose 100 each 12   insulin glargine (LANTUS) 100 UNIT/ML injection Inject 30 Units into the skin daily.     insulin lispro (HUMALOG) 100 UNIT/ML injection Inject 1 Units into the skin 3 (three) times daily with meals. Sliding scale     Insulin Pen Needle (PEN NEEDLES) 32G X 4 MM MISC 1 each by Does not apply route daily. 100 each 3   isosorbide mononitrate (IMDUR) 120 MG 24 hr tablet TAKE ONE TABLET BY MOUTH ONCE DAILY 90 tablet 3   JARDIANCE 25 MG TABS tablet TAKE 1 TABLET BY MOUTH ONCE DAILY CONTACT OFFICE FOR APPOINTMENT 30 tablet 10   Lancet Devices (ACCU-CHEK SOFTCLIX) lancets Use as instructed for 3 times daily testing of blood glucose 1 each 0   losartan (COZAAR) 25 MG tablet Take 1 tablet (25 mg total) by mouth daily. 90 tablet 3   metFORMIN (GLUCOPHAGE) 1000 MG tablet TAKE 1 TABLET BY MOUTH ONCE DAILY WITH BREAKFAST *PATIENT NEEDS APPOINTMENT FOR FURTHER REFILLS* 30 tablet 10   nitroGLYCERIN (NITROSTAT) 0.4 MG SL tablet DISSOLVE 1 TABLET UNDER THE TONGUE AS NEEDED FOR CHEST PAIN EVERY 5 MINUTES UP TO 3 TIMES. IF NO RELIEF CALL 911. 25 tablet 10   potassium chloride SA (KLOR-CON M) 20 MEQ tablet TAKE 1 TABLET BY MOUTH ONCE DAILY 90 tablet 3   torsemide (DEMADEX) 20 MG tablet Take 2 tablets (40 mg total) by mouth 2 (two) times daily. 120 tablet 4   TRUEPLUS LANCETS 28G MISC 1 each by Does not apply route 3 (three) times daily. 100 each 12   scopolamine (TRANSDERM-SCOP) 1  MG/3DAYS Apply topically. (Patient not taking: Reported on 09/25/2023)     No current facility-administered medications for this encounter.   BP (!) 144/68   Pulse 63   Wt 92.4 kg (203 lb 12.8 oz)   SpO2 97%   BMI 28.42 kg/m    Wt Readings from Last 3 Encounters:  09/25/23 92.4 kg (203 lb 12.8 oz)  09/10/23 91.1 kg (200 lb 12.8 oz)  09/05/23 89.1 kg (196 lb 6.4 oz)   PHYSICAL EXAM: General:  NAD. No resp difficulty, walked into clinic HEENT: Normal Neck: Supple. JVP 8-10 Cor: Irregular rate & rhythm. No rubs, gallops or murmurs. Lungs: Clear Abdomen: Soft, obese, nontender, nondistended.  Extremities: No cyanosis, clubbing, rash, trace pedal edema Neuro: Alert & oriented x 3, moves all 4 extremities w/o difficulty. Affect pleasant.  ASSESSMENT/PLAN 1. Chronic diastolic CHF: EF 82% on cardiac MRI in 6/14 but EF improved back to 55-60% on 2/16 echo.  However, EF down to 26% on Cardiolite in 9/16.  Echo in 9/20 showed EF 60-65%, moderate LVH, normal RV, mild-moderate MR.  Echo in 5/21 with EF 55%, normal RV.  He has a Cardiomems device, but has not used in months.  Echo in 6/23 showed EF 55-60% with severe asymmetric septal hypertrophy.  Cardiac MRI (10/23) showed LVEF 47%, no LVOT, moderate to severe asymmetric hypertrophy of the basal to mid inferoseptal and anteroseptum, RVEF 46%, ECV 34%, non-coronary LGE pattern most consistent with hypertrophic CM. 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  LMNA mutations can cause a HCM phenotype.  Echo (1/25) with EF 60-65%, severe LVH, normal RV.  NYHA class II-IIb symptoms, functional status confounded by chronic back pain. He does not appear markedly volume overloaded, but weight up 3 lbs - Restart eplerenone  25 mg daily. BMET/BNP today, repeat BMET in 1 week - Continue torsemide 40 mg bid.   - Continue empagliflozin 10 mg daily. No GU symptoms. - Continue carvedilol 12.5 mg bid. -  Continue losartan 25 mg daily.  - He has been referred again to genetic evaluation with Dr. Sidney Ace given LMNA mutation that may be associated with HCM phenotype. We have tried several times to set up this appointment but to no avail.  He says he is willing to go.  His daughters will help facilitate.   - Discussed cardiomems usage, he says that he will restart.  Daughter says she will help him with this 2. Atrial fibrillation/flutter: Paroxysmal. He has had atrial flutter ablations x 2 remotely.  He was on sotalol. He tolerates atrial arrhythmias poorly, seems to worsen CHF.  Zio patch in 4/21 with 20% atrial flutter. He saw EP, repeat ablation was not recommended. Repeat Zio in 9/22 showed only NSR and PACs.  Sotalol stopped due to poor compliance.  He was back in AFL at 1/25 follow up, off sotalol. He was loaded with amiodarone and remains in rate controlled AFL today. - Continue amiodarone 200 mg daily. Repeat LFTs today, mildly elevated at last check. Discussed decreasing Tylenol use and cutting back on beer. - Continue Eliquis 5 mg bid. CBC today. - Will arrange for TEE-guided DCCV. Given poor compliance generally as well as history of LA appendage thrombus, he should get a TEE prior to DCCV regardless.   Informed Consent   Shared Decision Making/Informed Consent   The risks [stroke, cardiac arrhythmias rarely resulting in the need for a temporary or permanent pacemaker, skin irritation or burns, esophageal damage, perforation (1:10,000 risk), bleeding, pharyngeal hematoma as well as other potential complications associated with conscious sedation including aspiration, arrhythmia, respiratory failure and death], benefits (treatment guidance, restoration of normal sinus rhythm, diagnostic support) and alternatives of a transesophageal echocardiogram guided cardioversion were discussed in detail with Mr. Berkovich and he is willing to proceed.     3. Coronary artery disease: LHC in 9/16 showed  diffuse CAD, most significant involving the distal RCA.  Patient had DES x 2 to RCA.  Cath in 3/17 with patent RCA stents and diffuse distal and branch vessel disease concerning for poorly controlled diabetes.  Cath in 5/18 again showed diffuse CAD without good interventional target.  Cath in 11/20 showed severe distal and branch vessel disease but no interventional targets and not good anatomy for CABG.  He has had atypical chest pain for past 3 days, suspect related to AFL vs mild volume overload. - No ASA, given Eliquis use.  - He has restarted atorvastatin 80 mg daily, was not taking up until recent hospital admission. Check lipids in 2 months. - Continue Coreg and Imdur daily for angina control.   4. Hyperlipidemia: he is now back on atorvastatin as above.  5. Hypertension: BP mildly elevated today. - Restart eplerenone, as above. 6. OSA: He cannot tolerate CPAP.  - With recurrent atrial arrhythmias, refer back to Dr. Mayford Knife to discuss options 7. CKD 3: Continue Jardiance. BMET today.  - Follow renal function closely with addition of eplerenone today. 8. DM: Has been poorly compliant. Followup with endocrinology.  9. ETOH use: Discussed limiting ETOH use. - He has cut back to 2 beers/week. 10. Non-compliance: Chronic problem.  Long discussion today about the need to take his medications.  He seems motivated to improve.  - Consider restarting paramedicine to help with compliance. His meds  are in bubble packs. 11. TIA: TIA in 1/25, probably due to noncompliance with Eliquis.   Follow up in 2-3 weeks with APP, after TEE/DCCV.  Anderson Malta [facility] FNP-BC 09/25/2023

## 2023-09-25 NOTE — Patient Instructions (Signed)
Thank you for coming in today  If you had labs drawn today, any labs that are abnormal the clinic will call you No news is good news  You have been referred to Dr. Mayford Knife for CPAP , their office will call you for further appointment details    Medications: START Epelernone 25 mg 1 tablet daily   You are scheduled for a TEE/Cardioversion/TEE Cardioversion on February 19th, 2025 with Dr. Shirlee Latch.  Please arrive at the [facility] (Main Entrance A) at [facility]: [address] [place], [place] [address] at 1030 am. (1 hour prior to procedure unless lab work is needed; if lab work is needed arrive 1.5 hours ahead)  DIET: Nothing to eat or drink after midnight except a sip of water with medications (see medication instructions below)  Medication Instructions: Hold Torsemide  Continue your anticoagulant: Eliquis You will need to continue your anticoagulant after your procedure until you  are told by your  Provider that it is safe to stop      You must have a responsible person to drive you home and stay in the waiting area during your procedure. Failure to do so could result in cancellation.  Bring your insurance cards.  *Special Note: Every effort is made to have your procedure done on time. Occasionally there are emergencies that occur at the hospital that may cause delays. Please be patient if a delay does occur.     Follow up appointments:  Your physician recommends that you schedule a follow-up appointment in:  2 weeks after TEE/Cardioversion    Do the following things EVERYDAY: Weigh yourself in the morning before breakfast. Write it down and keep it in a log. Take your medicines as prescribed Eat low salt foods--Limit salt (sodium) to 2000 mg per day.  Stay as active as you can everyday Limit all fluids for the day to less than 2 liters   At the [facility], you and your health needs are our priority. As part of our continuing mission to  provide you with exceptional heart care, we have created designated Provider Care Teams. These Care Teams include your primary Cardiologist (physician) and Advanced Practice Providers (APPs- Physician Assistants and Nurse Practitioners) who all work together to provide you with the care you need, when you need it.   You may see any of the following providers on your designated Care Team at your next follow up: Dr Arvilla Meres Dr Marca Ancona Dr. Marcos Eke, NP Robbie Lis, [place] [facility] [place], [place] [name], NP Karle Plumber, PharmD   Please be sure to bring in all your medications bottles to every appointment.    Thank you for choosing [facility]  If you have any questions or concerns before your next appointment please send [place] a message through [place] or call our office at [phone].    TO LEAVE A MESSAGE FOR THE NURSE SELECT OPTION 2, PLEASE LEAVE A MESSAGE INCLUDING: YOUR NAME DATE OF BIRTH CALL BACK NUMBER REASON FOR CALL**this is important as we prioritize the call backs  YOU WILL RECEIVE A CALL BACK THE SAME DAY AS LONG AS YOU CALL BEFORE 4:00 PM

## 2023-09-25 NOTE — Progress Notes (Signed)
Patient ID: [name] [name], male   DOB: 11-Sep-1955, 68 y.o.   MRN: [id number] PCP: Deeann Saint, MD HF Cardiology: Dr. Shirlee Latch  Chief Complaint: HF follow up  68 y.o. with history of HTN, diabetes, paroxysmal atrial fibrillation, and CAD presents for cardiology followup.  He was admitted in 5/13 with chest pain with exertion.  LHC was done showing global HK with EF 45% and diffuse, severe distal and branch vessel disease.  There was no interventional option, but it was suspect that this disease could be causing his anginal-type pain.  Echo at that time was read as showing EF 60-65%.  He was admitted in 5/14 with hypertensive urgency and chest pain.  He ruled out for MI and BP was controlled.  His EF was 50-55% by echo.  ETT-Sestamibi done as outpatient showed small fixed inferior defect with no ischemia but EF was 31%.  He was quite hypertensive during the study.  Cardiac MRI done to confirm EF in 5/14 showed EF 44%.  He had an episode of atrial fibrillation/RVR in 3/15 but went back into NSR.  Echo in 2/16 showed EF 55-60% with moderate diastolic dysfunction.   He had a Cardiolite in 9/16 showing inferior ischemia.  He was having exertional dyspnea with less activity than normal.  There was concern for worsened CAD and he was sent for [facility] in 9/16. By RHC, filling pressures were not significantly elevated. He had a severe distal RCA stenosis that was treated with DES x 2.  Echo showed EF 55-60%.    In 10/16, he was admitted with acute cholecystitis.  He received a cholecystostomy tube.  Cholecystectomy done in 6/17.   Atrial fibrillation in 1/17, cardioverted to NSR.   Recurrent concerning chest pain in 3/17.  Repeat LHC showed diffuse distal and branch vessel disease consistent with poorly-controlled DM, but patent RCA stents.    He was admitted with atrial fibrillation in 12/17. Started on sotalol and converted to NSR.    He had recurrent chest pain in 5/18 with abnormal  Cardiolite.  Cath was done again in 5/18, showing diffuse CAD with no good interventional options.   Admitted 11/20 with increased dyspnea and exertional chest pain. Hgb went down to 8.1. GI consulted.  Had LHC that showed severe distal and branch vessel disease but no interventional targets and not good anatomy for CABG. Continued on medical management. Capsule endoscopy without recent or active bleeding, no AVMs. He went into atrial flutter during his hospitalization. He went back into NSR, however, after discharge.  +Snoring and daytime sleepiness.   He has had episodes of syncope associated with vigorous coughing.   He was in the ER with atrial flutter and dyspnea in 4/21. Zio monitor in 9/22 showed NSR with PACs.   Evaluated in ED 01/14/22 for cough, found to be COVID +. Received molnupiravir.  Admitted 01/22/22 with CP. Cards consulted, troponin negative x 2, Echo showed EF 55-60%, severe asymmetric LV hypertrophy, normal RV. No new WMA and CP resolved, so LHC deferred.  Coreg decreased to 12.5 bid due to low BP and Mobitz Type 1. Discharged home, weight 200 lbs.   cMRI 10/23 concerning for hypertrophic cardiomyopathy.  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  LMNA mutations can cause a HCM phenotype.   Scheduled for TEE/DCCV for recurrent atrial flutter in 5/24. LA appendage thrombus seen so DCCV aborted (noncompliant with Eliquis). He was brought back for repeat  TEE/DCCV in 7/24 after being compliant with Eliquis. He was in NSR when he came in for the procedure.   He was admitted in 1/25 at [facility] with expressive aphasia that resolved.  He had not been taking any medications except for occasionally Eliquis.  Imaging did not show CVA, TIA suspected. Echo showed EF 60-65% with severe LVH and normal RV. Carotid dopplers showed 40-59% LICA stenosis. LDL was 159 (not taking statin) and HgbA1c was 13.2 (not compliant with diabetes  regimen).   Post hospital follow up 1/25, he was in atrial flutter. Not on sotalol => was not on it when he went to the hospital in [place] [facility] and they did not restart it. NYHA II and volume stable. He was loaded with amiodarone with plan to follow up in 2 weeks. If remained in AFL, arrange TEE-guided DCCV (given poor compliance and history of LA appendage thrombus).  Today he returns for HF follow up, with daughter present via phone. Overall feeling fair. He has SOB walking short distances on flat ground. He has had chest pain for past 3 days, mostly when "getting up and down". Exercise makes pain better. Feet are swelling and he has occasional dizziness, no falls. Denies palpitations, abnormal bleeding, or PND/Orthopnea. Chronically sleeps reclined. He is not using Cardiomems. He is not weighing at home. Taking all medications. He has CPAP but not wearing it. Drinks 1-2 beers/week, uses tylenol for chronic back pain.  ECG (personally reviewed): atypical atrial flutter rate 63 bpm  Cardiomems: not using  Labs (1/24): K 3.7, creatinine 1.64 Labs (5/24): K 2.8, creatinine 2.01 Labs (7/24): LDL 48 Labs (1/25): K 3.6, creatinine 1.45, LDL 159  PMH: 1. DM2 2. Gout 3. HTN: Cough with ACEI.  4. Cardiomyopathy: Suspect mixed ischemic/nonischemic (?ETOH-related).  EF 35% in 2008.  Echo in 5/13 showed EF 60-65% with moderate LVH but EF was 45% on LV-gram in 5/13.  Echo (5/14) with EF 50-55%, mild LVH, inferobasal HK, mild MR.  ETT-Sestamibi in 5/14, however, showed EF 31%.  Cardiac MRI (6/14) with EF 44%, mild global hypokinesis, subepicardial delayed enhancement in a nonspecific RV insertion site pattern.  Echo (2/16) with EF 55-60%, moderate diastolic dysfunction. RHC (9/16) with mean RA 8, PA 39/20 mean 28, mean PCWP 14, CI 2.2. Echo (9/16) with EF 55-60%, grade II diastolic dysfunction.  - Echo (6/17) with EF 55-60%, moderate LVH, grade II diastolic dysfunction, mild AI, mild MR.  - RHC (11/20): mean  RA 4, PA 46/14 mean 26, mean PCWP 13, CI 3.23 - Echo (9/20): EF 60-65%, moderate LVH, normal RV size and systolic function, mild-moderate MR, mild AI.  - Echo (5/21): EF 55%, RV normal - Has Cardiomems.  - Echo (6/23): EF 55-60%, severe asymmetric LV hypertrophy, normal RV. - cMRI (11/23): LVEF 47%, no LVOT, moderate to severe asymmetric hypertrophy of the basal to mid inferoseptal and anteroseptum, RVEF 46%, ECV 34%, non-coronary LGE pattern most consistent with hypertrophic CM - Echo (1/25): EF 60-65%, severe LVH, normal RV 5. CAD: LHC in 2010 with mild nonobstructive disease.  LHC (5/13) with diffuse distal and branch vessel disease, mild global hypokinesis and EF 45%.  ETT-Sestamibi (5/14): EF 31%, small fixed inferior defect with no ischemia.  Lexiscan Cardiolite (9/16) with EF 26%, moderate inferior defect that was partially reversible, suggesting ischemia => High risk study. LHC (9/16) with 40-50% mLAD, diffuse up to 50% distal LAD, 90% ostium of branch off ramus, 95% dRCA => DES to RCA x 2, EF 55-60%.   -  LHC (3/17) with diffuse branch and distal vessel disease c/w poorly controlled DM, RCA stents patent.  - LHC (5/18): Diffuse CAD with no good interventional option. 60% mRCA, 90% dLAD, 50% serial mid ramus stenoses, ramus branches with ostial 80-90% stenoses. - LHC (11/20): Severe distal and branch vessel disease but no interventional targets and not good anatomy for CABG  6. H/o chronic HBV 7. Osteochondroma left shoulder.  8. Hyperlipidemia 9. Paroxysmal atrial fibrillation: Coumadin.  Developed cough and increased ESR with amiodarone.  DCCV in 1/17.  Recurrent atrial fibrillation in 12/17, sotalol started.  10. Atrial flutter: had ablations in 2007 and 2008.  - Zio patch in 4/21 with 20% atrial flutter.  11. OSA: Does not use CPAP.  12. Acute cholecystitis (10/16): Cholecystostomy tube placed.  Cholecystectomy 6/17.  13. CKD stage 3 14. Anemia - Suspected GI bleed but EGD/c-scope  9/20 without significant findings.  - Capsule Endoscopy (11/20) without recent or active bleeding, no AVMs.  15. Syncope: Suspected cough syncope.  16. OSA 17. TIA (1/25): Carotid dopplers (1/25) with 40-59% LICA stenosis.   SH: Married, prior smoker.  Some ETOH, occasionally heavy in past.  Does use occasional marijuana. Out of work Chiropractor. 2 daughters in grad school.   FH: CAD  ROS: All systems reviewed and negative except as per HPI.   Current Outpatient Medications  Medication Sig Dispense Refill   ACCU-CHEK SOFTCLIX LANCETS lancets Use as instructed for three times daily testing of blood glucose 100 each 12   acetaminophen (TYLENOL) 500 MG tablet Take 1,000 mg by mouth every 6 (six) hours as needed (pain).     albuterol (VENTOLIN HFA) 108 (90 Base) MCG/ACT inhaler Inhale 1-2 puffs into the lungs every 6 (six) hours as needed for wheezing or shortness of breath. 8 g 0   amiodarone (PACERONE) 200 MG tablet Take 1 tablet (200 mg total) by mouth 2 (two) times daily for 10 days, THEN 1 tablet (200 mg total) daily. 90 tablet 3   apixaban (ELIQUIS) 5 MG TABS tablet TAKE ONE TABLET BY MOUTH TWICE DAILY 180 tablet 3   atorvastatin (LIPITOR) 80 MG tablet TAKE 1 TABLET BY MOUTH ONCE DAILY 90 tablet 3   blood glucose meter kit and supplies KIT Dispense based on patient and insurance preference. Use up to four times daily as directed. (FOR ICD-9 250.00, 250.01). 1 each 0   Blood Glucose Monitoring Suppl (ACCU-CHEK AVIVA PLUS) w/Device KIT Use as directed for 3 times daily testing of blood glucose 1 kit 0   carvedilol (COREG) 12.5 MG tablet TAKE 1 TABLET BY MOUTH TWICE DAILY WITH MEALS *PATIENT NEEDS APPOINTMENT FOR FURTHER REFILLS* 180 tablet 0   carvedilol (COREG) 25 MG tablet Take 12.5 mg by mouth 2 (two) times daily with a meal.     colchicine 0.6 MG tablet On day 1 take 2 tablets at first sign of gout flare.  Take 1 tablet 1 hour later.  Then take 1 tablet daily starting the next day until  resolution of gout flare. 10 tablet 1   Continuous Blood Gluc Receiver (FREESTYLE LIBRE 2 READER) DEVI Check your blood sugar three times per day before meals and once before bedtime 2 each 11   Continuous Glucose Receiver (FREESTYLE LIBRE 3 READER) DEVI 1 Device by Does not apply route continuous. 2 each 11   Continuous Glucose Sensor (FREESTYLE LIBRE 3 SENSOR) MISC 1 Device by Does not apply route continuous. 6 each 0   glucose blood (ACCU-CHEK AVIVA PLUS) test  strip Use as instructed for 3 times daily testing of blood glucose 100 each 12   insulin glargine (LANTUS) 100 UNIT/ML injection Inject 30 Units into the skin daily.     insulin lispro (HUMALOG) 100 UNIT/ML injection Inject 1 Units into the skin 3 (three) times daily with meals. Sliding scale     Insulin Pen Needle (PEN NEEDLES) 32G X 4 MM MISC 1 each by Does not apply route daily. 100 each 3   isosorbide mononitrate (IMDUR) 120 MG 24 hr tablet TAKE ONE TABLET BY MOUTH ONCE DAILY 90 tablet 3   JARDIANCE 25 MG TABS tablet TAKE 1 TABLET BY MOUTH ONCE DAILY CONTACT OFFICE FOR APPOINTMENT 30 tablet 10   Lancet Devices (ACCU-CHEK SOFTCLIX) lancets Use as instructed for 3 times daily testing of blood glucose 1 each 0   losartan (COZAAR) 25 MG tablet Take 1 tablet (25 mg total) by mouth daily. 90 tablet 3   metFORMIN (GLUCOPHAGE) 1000 MG tablet TAKE 1 TABLET BY MOUTH ONCE DAILY WITH BREAKFAST *PATIENT NEEDS APPOINTMENT FOR FURTHER REFILLS* 30 tablet 10   nitroGLYCERIN (NITROSTAT) 0.4 MG SL tablet DISSOLVE 1 TABLET UNDER THE TONGUE AS NEEDED FOR CHEST PAIN EVERY 5 MINUTES UP TO 3 TIMES. IF NO RELIEF CALL 911. 25 tablet 10   potassium chloride SA (KLOR-CON M) 20 MEQ tablet TAKE 1 TABLET BY MOUTH ONCE DAILY 90 tablet 3   torsemide (DEMADEX) 20 MG tablet Take 2 tablets (40 mg total) by mouth 2 (two) times daily. 120 tablet 4   TRUEPLUS LANCETS 28G MISC 1 each by Does not apply route 3 (three) times daily. 100 each 12   scopolamine (TRANSDERM-SCOP) 1  MG/3DAYS Apply topically. (Patient not taking: Reported on 09/25/2023)     No current facility-administered medications for this encounter.   BP (!) 144/68   Pulse 63   Wt 92.4 kg (203 lb 12.8 oz)   SpO2 97%   BMI 28.42 kg/m    Wt Readings from Last 3 Encounters:  09/25/23 92.4 kg (203 lb 12.8 oz)  09/10/23 91.1 kg (200 lb 12.8 oz)  09/05/23 89.1 kg (196 lb 6.4 oz)   PHYSICAL EXAM: General:  NAD. No resp difficulty, walked into clinic HEENT: Normal Neck: Supple. JVP 8-10 Cor: Irregular rate & rhythm. No rubs, gallops or murmurs. Lungs: Clear Abdomen: Soft, obese, nontender, nondistended.  Extremities: No cyanosis, clubbing, rash, trace pedal edema Neuro: Alert & oriented x 3, moves all 4 extremities w/o difficulty. Affect pleasant.  ASSESSMENT/PLAN 1. Chronic diastolic CHF: EF 82% on cardiac MRI in 6/14 but EF improved back to 55-60% on 2/16 echo.  However, EF down to 26% on Cardiolite in 9/16.  Echo in 9/20 showed EF 60-65%, moderate LVH, normal RV, mild-moderate MR.  Echo in 5/21 with EF 55%, normal RV.  He has a Cardiomems device, but has not used in months.  Echo in 6/23 showed EF 55-60% with severe asymmetric septal hypertrophy.  Cardiac MRI (10/23) showed LVEF 47%, no LVOT, moderate to severe asymmetric hypertrophy of the basal to mid inferoseptal and anteroseptum, RVEF 46%, ECV 34%, non-coronary LGE pattern most consistent with hypertrophic CM. 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  LMNA mutations can cause a HCM phenotype.  Echo (1/25) with EF 60-65%, severe LVH, normal RV.  NYHA class II-IIb symptoms, functional status confounded by chronic back pain. He does not appear markedly volume overloaded, but weight up 3 lbs - Restart eplerenone  25 mg daily. BMET/BNP today, repeat BMET in 1 week - Continue torsemide 40 mg bid.   - Continue empagliflozin 10 mg daily. No GU symptoms. - Continue carvedilol 12.5 mg bid. -  Continue losartan 25 mg daily.  - He has been referred again to genetic evaluation with Dr. Sidney Ace given LMNA mutation that may be associated with HCM phenotype. We have tried several times to set up this appointment but to no avail.  He says he is willing to go.  His daughters will help facilitate.   - Discussed cardiomems usage, he says that he will restart.  Daughter says she will help him with this 2. Atrial fibrillation/flutter: Paroxysmal. He has had atrial flutter ablations x 2 remotely.  He was on sotalol. He tolerates atrial arrhythmias poorly, seems to worsen CHF.  Zio patch in 4/21 with 20% atrial flutter. He saw EP, repeat ablation was not recommended. Repeat Zio in 9/22 showed only NSR and PACs.  Sotalol stopped due to poor compliance.  He was back in AFL at 1/25 follow up, off sotalol. He was loaded with amiodarone and remains in rate controlled AFL today. - Continue amiodarone 200 mg daily. Repeat LFTs today, mildly elevated at last check. Discussed decreasing Tylenol use and cutting back on beer. - Continue Eliquis 5 mg bid. CBC today. - Will arrange for TEE-guided DCCV. Given poor compliance generally as well as history of LA appendage thrombus, he should get a TEE prior to DCCV regardless.   Informed Consent   Shared Decision Making/Informed Consent   The risks [stroke, cardiac arrhythmias rarely resulting in the need for a temporary or permanent pacemaker, skin irritation or burns, esophageal damage, perforation (1:10,000 risk), bleeding, pharyngeal hematoma as well as other potential complications associated with conscious sedation including aspiration, arrhythmia, respiratory failure and death], benefits (treatment guidance, restoration of normal sinus rhythm, diagnostic support) and alternatives of a transesophageal echocardiogram guided cardioversion were discussed in detail with Mr. Berkovich and he is willing to proceed.     3. Coronary artery disease: LHC in 9/16 showed  diffuse CAD, most significant involving the distal RCA.  Patient had DES x 2 to RCA.  Cath in 3/17 with patent RCA stents and diffuse distal and branch vessel disease concerning for poorly controlled diabetes.  Cath in 5/18 again showed diffuse CAD without good interventional target.  Cath in 11/20 showed severe distal and branch vessel disease but no interventional targets and not good anatomy for CABG.  He has had atypical chest pain for past 3 days, suspect related to AFL vs mild volume overload. - No ASA, given Eliquis use.  - He has restarted atorvastatin 80 mg daily, was not taking up until recent hospital admission. Check lipids in 2 months. - Continue Coreg and Imdur daily for angina control.   4. Hyperlipidemia: he is now back on atorvastatin as above.  5. Hypertension: BP mildly elevated today. - Restart eplerenone, as above. 6. OSA: He cannot tolerate CPAP.  - With recurrent atrial arrhythmias, refer back to Dr. Mayford Knife to discuss options 7. CKD 3: Continue Jardiance. BMET today.  - Follow renal function closely with addition of eplerenone today. 8. DM: Has been poorly compliant. Followup with endocrinology.  9. ETOH use: Discussed limiting ETOH use. - He has cut back to 2 beers/week. 10. Non-compliance: Chronic problem.  Long discussion today about the need to take his medications.  He seems motivated to improve.  - Consider restarting paramedicine to help with compliance. His meds  are in bubble packs. 11. TIA: TIA in 1/25, probably due to noncompliance with Eliquis.   Follow up in 2-3 weeks with APP, after TEE/DCCV.  Anderson Malta [facility] FNP-BC 09/25/2023

## 2023-10-01 ENCOUNTER — Other Ambulatory Visit (HOSPITAL_COMMUNITY): Payer: 59

## 2023-10-01 NOTE — Progress Notes (Signed)
Spoke to pt and instructed them to come at 1000 and to be NPO after 0000.  Instructed to take eliquis prior to coming to procedure. Confirmed that pt will have a ride home and someone to stay with them for 24 hours after the procedure. Instructed patient to not wear any jewelry or lotion.

## 2023-10-02 ENCOUNTER — Ambulatory Visit (HOSPITAL_COMMUNITY)
Admission: RE | Admit: 2023-10-02 | Discharge: 2023-10-02 | Disposition: A | Discharge status: Home / Self Care | Payer: 59 | Attending: Cardiology | Admitting: Cardiology

## 2023-10-02 ENCOUNTER — Other Ambulatory Visit: Payer: Self-pay

## 2023-10-02 ENCOUNTER — Encounter (HOSPITAL_COMMUNITY): Payer: Self-pay | Admitting: Cardiology

## 2023-10-02 ENCOUNTER — Ambulatory Visit (HOSPITAL_COMMUNITY)
Admission: RE | Admit: 2023-10-02 | Discharge: 2023-10-02 | Disposition: A | Discharge status: Home / Self Care | Payer: 59 | Source: Ambulatory Visit | Attending: Cardiology | Admitting: Cardiology

## 2023-10-02 ENCOUNTER — Encounter (HOSPITAL_COMMUNITY)
Admission: RE | Disposition: A | Discharge status: Home / Self Care | Payer: Self-pay | Source: Home / Self Care | Attending: Cardiology

## 2023-10-02 ENCOUNTER — Other Ambulatory Visit (HOSPITAL_COMMUNITY): Payer: 59

## 2023-10-02 ENCOUNTER — Ambulatory Visit (HOSPITAL_COMMUNITY): Payer: 59 | Admitting: Anesthesiology

## 2023-10-02 DIAGNOSIS — I484 Atypical atrial flutter: Secondary | ICD-10-CM | POA: Insufficient documentation

## 2023-10-02 DIAGNOSIS — J449 Chronic obstructive pulmonary disease, unspecified: Secondary | ICD-10-CM

## 2023-10-02 DIAGNOSIS — E785 Hyperlipidemia, unspecified: Secondary | ICD-10-CM | POA: Insufficient documentation

## 2023-10-02 DIAGNOSIS — I4892 Unspecified atrial flutter: Secondary | ICD-10-CM | POA: Diagnosis not present

## 2023-10-02 DIAGNOSIS — G4733 Obstructive sleep apnea (adult) (pediatric): Secondary | ICD-10-CM | POA: Diagnosis not present

## 2023-10-02 DIAGNOSIS — Z87891 Personal history of nicotine dependence: Secondary | ICD-10-CM | POA: Insufficient documentation

## 2023-10-02 DIAGNOSIS — Z7984 Long term (current) use of oral hypoglycemic drugs: Secondary | ICD-10-CM | POA: Diagnosis not present

## 2023-10-02 DIAGNOSIS — Z91148 Patient's other noncompliance with medication regimen for other reason: Secondary | ICD-10-CM | POA: Diagnosis not present

## 2023-10-02 DIAGNOSIS — I2510 Atherosclerotic heart disease of native coronary artery without angina pectoris: Secondary | ICD-10-CM | POA: Diagnosis not present

## 2023-10-02 DIAGNOSIS — Z79899 Other long term (current) drug therapy: Secondary | ICD-10-CM | POA: Diagnosis not present

## 2023-10-02 DIAGNOSIS — I5032 Chronic diastolic (congestive) heart failure: Secondary | ICD-10-CM | POA: Diagnosis not present

## 2023-10-02 DIAGNOSIS — I130 Hypertensive heart and chronic kidney disease with heart failure and stage 1 through stage 4 chronic kidney disease, or unspecified chronic kidney disease: Secondary | ICD-10-CM | POA: Insufficient documentation

## 2023-10-02 DIAGNOSIS — Z8673 Personal history of transient ischemic attack (TIA), and cerebral infarction without residual deficits: Secondary | ICD-10-CM | POA: Diagnosis not present

## 2023-10-02 DIAGNOSIS — Z7901 Long term (current) use of anticoagulants: Secondary | ICD-10-CM | POA: Diagnosis not present

## 2023-10-02 DIAGNOSIS — N183 Chronic kidney disease, stage 3 unspecified: Secondary | ICD-10-CM | POA: Insufficient documentation

## 2023-10-02 DIAGNOSIS — I083 Combined rheumatic disorders of mitral, aortic and tricuspid valves: Secondary | ICD-10-CM | POA: Diagnosis not present

## 2023-10-02 DIAGNOSIS — I480 Paroxysmal atrial fibrillation: Secondary | ICD-10-CM | POA: Diagnosis not present

## 2023-10-02 DIAGNOSIS — E1122 Type 2 diabetes mellitus with diabetic chronic kidney disease: Secondary | ICD-10-CM | POA: Diagnosis not present

## 2023-10-02 DIAGNOSIS — Z8249 Family history of ischemic heart disease and other diseases of the circulatory system: Secondary | ICD-10-CM | POA: Diagnosis not present

## 2023-10-02 DIAGNOSIS — I5043 Acute on chronic combined systolic (congestive) and diastolic (congestive) heart failure: Secondary | ICD-10-CM | POA: Diagnosis not present

## 2023-10-02 HISTORY — PX: CARDIOVERSION: EP1203

## 2023-10-02 HISTORY — PX: TRANSESOPHAGEAL ECHOCARDIOGRAM (CATH LAB): EP1270

## 2023-10-02 LAB — ECHO TEE

## 2023-10-02 SURGERY — TRANSESOPHAGEAL ECHOCARDIOGRAM (TEE) (CATHLAB)
Anesthesia: Monitor Anesthesia Care

## 2023-10-02 MED ORDER — SODIUM CHLORIDE 0.9% FLUSH: 3.0000 mL | INTRAVENOUS | Status: DC | PRN

## 2023-10-02 MED ORDER — PROPOFOL 10 MG/ML IV BOLUS
INTRAVENOUS | Status: DC | PRN
  Administered 2023-10-02: 50 mg via INTRAVENOUS
  Administered 2023-10-02: 125 ug/kg/min via INTRAVENOUS

## 2023-10-02 MED ORDER — LIDOCAINE 2% (20 MG/ML) 5 ML SYRINGE
INTRAMUSCULAR | Status: DC | PRN
  Administered 2023-10-02: 100 mg via INTRAVENOUS

## 2023-10-02 MED ORDER — SODIUM CHLORIDE 0.9% FLUSH: 3.0000 mL | Freq: Two times a day (BID) | INTRAVENOUS | Status: DC

## 2023-10-02 MED ORDER — SODIUM CHLORIDE 0.9 % IV SOLN
INTRAVENOUS | Status: DC | PRN
Start: 2023-10-02 — End: 2023-10-02

## 2023-10-02 SURGICAL SUPPLY — 1 items: PAD DEFIB RADIO PHYSIO CONN (PAD) ×2 IMPLANT

## 2023-10-02 NOTE — Progress Notes (Signed)
  Echocardiogram 2D Echocardiogram has been performed.  [name] [name] 10/02/2023, 11:45 AM

## 2023-10-02 NOTE — CV Procedure (Signed)
Procedure: TEE  Indication: Atrial flutter  Sedation: Per anesthesiology.   Findings: Please see echo section for full report.  Normal LV size with moderate asymmetric septal hypertrophy, EF 55-60% with no wall motion abnormalities.  Normal RV size and systolic function.  Moderate left atrial enlargement.  Smoke in the LA appendage but no thrombus.  Mild right atrial enlargement.  No PFO/ASD by color doppler.  Trivial TR.  Mild mitral regurgitation.  Trileaflet aortic valve, probable Lambl's excrescence, no AS, mild AI.  Grade IV plaque descending thoracic aorta.   [name] [name] 10/02/2023 11:49 AM

## 2023-10-02 NOTE — Anesthesia Postprocedure Evaluation (Signed)
Anesthesia Post Note  Patient: [name] [name]  Procedure(s) Performed: TRANSESOPHAGEAL ECHOCARDIOGRAM CARDIOVERSION     Patient location during evaluation: Cath Lab Anesthesia Type: MAC Level of consciousness: awake and alert, patient cooperative and oriented Pain management: pain level controlled Vital Signs Assessment: post-procedure vital signs reviewed and stable Respiratory status: spontaneous breathing, nonlabored ventilation and respiratory function stable Cardiovascular status: blood pressure returned to baseline and stable Postop Assessment: no apparent nausea or vomiting Anesthetic complications: no   No notable events documented.  Last Vitals:  Vitals:   10/02/23 1200 10/02/23 1210  BP: (!) 153/105 (!) 166/100  Pulse: 71 72  Resp: 18 19  Temp:    SpO2: 93% 92%    Last Pain:  Vitals:   10/02/23 1210  TempSrc:   PainSc: 0-No pain                 Jerami Tammen,E. Omaria Plunk

## 2023-10-02 NOTE — Transfer of Care (Signed)
Immediate Anesthesia Transfer of Care Note  Patient: [name] [name]  Procedure(s) Performed: TRANSESOPHAGEAL ECHOCARDIOGRAM CARDIOVERSION  Patient Location: Cath Lab  Anesthesia Type:MAC  Level of Consciousness: drowsy and patient cooperative  Airway & Oxygen Therapy: Patient Spontanous Breathing  Post-op Assessment: Report given to RN and Post -op Vital signs reviewed and stable  Post vital signs: Reviewed and stable  Last Vitals:  Vitals Value Taken Time  BP 136/84 10/02/23 1139  Temp    Pulse 76 10/02/23 1141  Resp 22 10/02/23 1140  SpO2 93 % 10/02/23 1141  Vitals shown include unfiled device data.  Last Pain:  Vitals:   10/02/23 1140  TempSrc:   PainSc: Asleep         Complications: No notable events documented.

## 2023-10-02 NOTE — Discharge Instructions (Signed)

## 2023-10-02 NOTE — Anesthesia Preprocedure Evaluation (Addendum)
Anesthesia Evaluation  Patient identified by MRN, date of birth, ID band Patient awake    Reviewed: Allergy & Precautions, NPO status , Patient's Chart, lab work & pertinent test results, reviewed documented beta blocker date and time   History of Anesthesia Complications Negative for: history of anesthetic complications  Airway Mallampati: I  TM Distance: >3 FB Neck ROM: Full    Dental  (+) Edentulous Upper, Edentulous Lower   Pulmonary sleep apnea and Continuous Positive Airway Pressure Ventilation , COPD,  COPD inhaler, Patient abstained from smoking., former smoker   breath sounds clear to auscultation       Cardiovascular hypertension, Pt. on medications and Pt. on home beta blockers (-) angina + CAD, + Cardiac Stents (RCA DES x2) and +CHF  + dysrhythmias Atrial Fibrillation  Rhythm:Irregular Rate:Normal  '24 ECHO: EF 50%.  1.The LV has mildly decreased function, global hypokinesis. There is severe asymmetric left ventricular hypertrophy of the septal segment. No LV outflow tract gradient or mitral valve systolic anterior motion.   2. RVF is normal. The right ventricular size is normal.   3. Left atrial size was moderately dilated. A mobile left atrial appendage thrombus was detected.   4. Right atrial size was mildly dilated.   5. The mitral valve is normal in structure. Trivial MR. No evidence of mitral stenosis.   6. The aortic valve is tricuspid. Aortic valve regurgitation is trivial. No aortic stenosis is present.   7. No PFO or ASD.     Neuro/Psych   Anxiety     negative neurological ROS     GI/Hepatic negative GI ROS,,,(+)     substance abuse  alcohol use, Hepatitis -, B  Endo/Other  diabetes (glu 161), Insulin Dependent, Oral Hypoglycemic Agents  eliquis  Renal/GU Renal InsufficiencyRenal disease     Musculoskeletal   Abdominal   Peds  Hematology negative hematology ROS (+)   Anesthesia Other  Findings   Reproductive/Obstetrics                             Anesthesia Physical Anesthesia Plan  ASA: 3  Anesthesia Plan: MAC   Post-op Pain Management: Minimal or no pain anticipated   Induction:   PONV Risk Score and Plan: 1 and Treatment may vary due to age or medical condition  Airway Management Planned: Natural Airway and Nasal Cannula  Additional Equipment: None  Intra-op Plan:   Post-operative Plan:   Informed Consent: I have reviewed the patients History and Physical, chart, labs and discussed the procedure including the risks, benefits and alternatives for the proposed anesthesia with the patient or authorized representative who has indicated his/her understanding and acceptance.       Plan Discussed with: CRNA and Surgeon  Anesthesia Plan Comments:         Anesthesia Quick Evaluation

## 2023-10-02 NOTE — Procedures (Signed)
Electrical Cardioversion Procedure Note [name] [name] [id number] [birth year]  Procedure: Electrical Cardioversion Indications:  Atrial Flutter  Procedure Details Consent: Risks of procedure as well as the alternatives and risks of each were explained to the (patient/caregiver).  Consent for procedure obtained. Time Out: Verified patient identification, verified procedure, site/side was marked, verified correct patient position, special equipment/implants available, medications/allergies/relevent history reviewed, required imaging and test results available.  Performed  Patient placed on cardiac monitor, pulse oximetry, supplemental oxygen as necessary.  Sedation given:  Propofol Pacer pads placed anterior and posterior chest.  Cardioverted 1 time(s).  Cardioverted at 150J.  Evaluation Findings: Post procedure EKG shows: NSR Complications: None Patient did tolerate procedure well.   [name] [name] 10/02/2023, 11:50 AM

## 2023-10-02 NOTE — Interval H&P Note (Signed)
History and Physical Interval Note:  10/02/2023 11:20 AM  [name] [name]  has presented today for surgery, with the diagnosis of AFLUTTER.  The various methods of treatment have been discussed with the patient and family. After consideration of risks, benefits and other options for treatment, the patient has consented to  Procedure(s): TRANSESOPHAGEAL ECHOCARDIOGRAM (N/A) CARDIOVERSION (N/A) as a surgical intervention.  The patient's history has been reviewed, patient examined, no change in status, stable for surgery.  I have reviewed the patient's chart and labs.  Questions were answered to the patient's satisfaction.     Twila Rappa [employer]

## 2023-10-07 DIAGNOSIS — E113293 Type 2 diabetes mellitus with mild nonproliferative diabetic retinopathy without macular edema, bilateral: Secondary | ICD-10-CM | POA: Diagnosis not present

## 2023-10-14 ENCOUNTER — Other Ambulatory Visit (HOSPITAL_COMMUNITY): Payer: Self-pay | Admitting: Cardiology

## 2023-10-14 ENCOUNTER — Other Ambulatory Visit: Payer: Self-pay | Admitting: "Endocrinology

## 2023-10-15 ENCOUNTER — Encounter (HOSPITAL_COMMUNITY): Payer: 59

## 2023-10-16 DIAGNOSIS — G459 Transient cerebral ischemic attack, unspecified: Secondary | ICD-10-CM | POA: Diagnosis not present

## 2023-10-16 NOTE — Telephone Encounter (Signed)
 Medication refill request complete

## 2023-10-17 ENCOUNTER — Telehealth: Payer: Self-pay | Admitting: Family Medicine

## 2023-10-17 NOTE — Telephone Encounter (Signed)
 Called patient unable to leave VM, called to get patient  sch for a 6 week follow-up with Dr. Salomon Fick. Per last office Visit note.

## 2023-10-17 NOTE — Telephone Encounter (Signed)
 Copied from CRM [id number]. Topic: General - Other >> Oct 17, 2023  8:12 AM [place] A wrote: Reason for CRM: Patient called because someone called from office -please call pt back

## 2023-10-18 ENCOUNTER — Ambulatory Visit: Payer: 59 | Admitting: Family Medicine

## 2023-10-24 ENCOUNTER — Telehealth: Payer: Self-pay | Admitting: *Deleted

## 2023-10-24 ENCOUNTER — Other Ambulatory Visit: Payer: Self-pay | Admitting: Family Medicine

## 2023-10-24 DIAGNOSIS — E1165 Type 2 diabetes mellitus with hyperglycemia: Secondary | ICD-10-CM

## 2023-10-24 NOTE — Telephone Encounter (Signed)
 Copied from CRM [id number]. Topic: Clinical - Medication Refill >> Oct 24, 2023  1:54 PM Drema Balzarine wrote: Most Recent Primary Care Visit:  Provider: Deeann Saint  Department: [facility]  Visit Type: HOSPITAL FOLLOW UP  Date: 09/05/2023  Medication: Continuous Glucose Receiver (FREESTYLE LIBRE 3 READER) DEVI   Has the patient contacted their pharmacy? Yes (Agent: If no, request that the patient contact the pharmacy for the refill. If patient does not wish to contact the pharmacy document the reason why and proceed with request.) (Agent: If yes, when and what did the pharmacy advise?)  Is this the correct pharmacy for this prescription? Yes If no, delete pharmacy and type the correct one.  This is the patient's preferred pharmacy:    [facility] [address] ([address] [employer] - [address] [place] ([place]) [place] [address] Phone: [phone] Fax: [phone]   Has the prescription been filled recently? Yes  Is the patient out of the medication? Yes, patient ran out yesterday  Has the patient been seen for an appointment in the last year OR does the patient have an upcoming appointment? Yes  Can we respond through MyChart? No  Agent: Please be advised that Rx refills may take up to 3 business days. We ask that you follow-up with your pharmacy.

## 2023-10-24 NOTE — Telephone Encounter (Signed)
 Patient was identified as falling into the [place] Measure - Diabetes.   Patient was: Appointment scheduled with primary care provider in the next 30 days.

## 2023-10-28 ENCOUNTER — Ambulatory Visit (INDEPENDENT_AMBULATORY_CARE_PROVIDER_SITE_OTHER): Admitting: Family Medicine

## 2023-10-28 ENCOUNTER — Encounter: Payer: Self-pay | Admitting: Family Medicine

## 2023-10-28 VITALS — BP 146/80 | HR 52 | Temp 98.3°F | Ht 71.0 in | Wt 207.2 lb

## 2023-10-28 DIAGNOSIS — I10 Essential (primary) hypertension: Secondary | ICD-10-CM

## 2023-10-28 DIAGNOSIS — I5032 Chronic diastolic (congestive) heart failure: Secondary | ICD-10-CM

## 2023-10-28 DIAGNOSIS — Z8679 Personal history of other diseases of the circulatory system: Secondary | ICD-10-CM

## 2023-10-28 DIAGNOSIS — E119 Type 2 diabetes mellitus without complications: Secondary | ICD-10-CM

## 2023-10-28 DIAGNOSIS — H259 Unspecified age-related cataract: Secondary | ICD-10-CM | POA: Diagnosis not present

## 2023-10-28 DIAGNOSIS — Z8673 Personal history of transient ischemic attack (TIA), and cerebral infarction without residual deficits: Secondary | ICD-10-CM | POA: Diagnosis not present

## 2023-10-28 DIAGNOSIS — Z794 Long term (current) use of insulin: Secondary | ICD-10-CM | POA: Diagnosis not present

## 2023-10-28 LAB — POCT GLYCOSYLATED HEMOGLOBIN (HGB A1C): Hemoglobin A1C: 9.8 % — AB (ref 4.0–5.6)

## 2023-10-28 MED ORDER — FREESTYLE LIBRE 3 SENSOR MISC: 1.0000 | 1 refills | Status: DC

## 2023-10-28 MED ORDER — INSULIN GLARGINE 100 UNIT/ML ~~LOC~~ SOLN: 35.0000 [IU] | Freq: Every day | SUBCUTANEOUS | Status: DC

## 2023-10-28 NOTE — Progress Notes (Signed)
 Established Patient Office Visit   Subjective  Patient ID: [name] [name], male    DOB: 11/19/1955  Age: 68 y.o. MRN: [id number]  Chief Complaint  Patient presents with   Medical Management of Chronic Issues    Patient is a 68 year old male seen for follow-up on chronic conditions.  Patient states he is feeling better status post cardioversion for A-fib.  Using freestyle libre 3 ultra sensors as ordered by mistake.  States seem to be working with reader device.  Requesting refill on sensors as has two left.  Blood sugar in the 250s-280s.  Taking Lantus 30 units nightly and sliding scale.  Patient endorses having spaghetti for dinner last night.  Recently started eating vegetables such as broccoli, string beans, lima beans.  Patient mentions at times forgetting if he took medication or not.  Patient received medicine via pill pack however has been using leftover medications so that he is not wasting them.  Patient has a pill organizer patient has a pill organizer but is not using it.  Patient with bilateral cataracts.  Seen recently by [facility] eye care? off of [address]  Advised to have surgery for removal.    Patient Active Problem List   Diagnosis Date Noted   Wrist fracture, left, closed, initial encounter 06/05/2022   Rib pain 06/05/2022   Mobitz (type) I [facility]) atrioventricular block 01/23/2022   Chest pain of uncertain etiology 01/22/2022   Syncope 02/09/2020   Generalized weakness 06/27/2019   Acute on chronic congestive heart failure with left ventricular diastolic dysfunction (HCC) 06/26/2019   Anemia due to chronic kidney disease 06/26/2019   CKD (chronic kidney disease), stage III (HCC) 06/26/2019   Type 2 diabetes mellitus without complication (HCC) 06/26/2019   Iron deficiency anemia due to chronic blood loss 06/26/2019   History of atrial fibrillation    History of type 2 diabetes mellitus    Anemia    Chest pain 04/24/2019   Overweight (BMI  25.0-29.9) 02/28/2017   Unstable angina (HCC) 08/03/2016   S/P laparoscopic cholecystectomy 02/07/2016   Paroxysmal A-fib (HCC) 01/22/2016   Coronary artery disease involving native coronary artery with unstable angina pectoris (HCC) 12/29/2015   Diabetes (HCC) 11/12/2015   Chronic anticoagulation 08/22/2015   Atrial flutter with controlled response (HCC) 08/22/2015   RUQ pain    Cholelithiases- drain in place 06/04/2015   Essential hypertension    Chronic diastolic CHF (congestive heart failure) (HCC) 04/24/2015   Excessive daytime sleepiness 11/25/2014   Obstructive sleep apnea 11/25/2014   NICM (nonischemic cardiomyopathy) (HCC) 03/11/2014   Anticoagulation goal of INR 2 to 3 10/19/2013   Eczema 10/19/2013   Low libido 10/16/2013   Persistent atrial fibrillation (HCC) 10/06/2013   Rectal bleeding 12/18/2011   HYPERCHOLESTEROLEMIA 07/11/2010   DENTAL CARIES 06/30/2010   GOUT 05/05/2009   HEPATITIS B, CHRONIC 12/30/2006   OSTEOCHONDROMA 12/30/2006   Mitral valve disorder 12/30/2006   LIVER FUNCTION TESTS, ABNORMAL, HX OF 12/30/2006   History of colonic polyps 12/30/2006   COLONOSCOPY, HX OF 05/12/2001   Past Medical History:  Diagnosis Date   Anxiety    CAD (coronary artery disease), native coronary artery    a. Nonobstructive CAD by cath 2013 - diffuse distal and branch vessel CAD, no severe disease in the major coronaries, LV mild global hypokinesis, EF 45%. b. ETT-Sestamibi 5/14: EF 31%, small fixed inferior defect with no ischemia.   Chronic CHF (HCC)    a. Mixed ICM/NICM (?EtOH). EF 35% in 2008. Echo  5/13: EF 60-65%, mod LVH, EF 45% on V gram in 12/2011. EF 12/2012: EF 50-55%, mild LVH, inferobasal HK, mild MR. ETT-Ses 5/14 EF 41%. Cardiac MRI 5/14: EF 44%, mild global HK, subepicardial delayed enhancement in nonspecific RV insertion pattern.   COLONIC POLYPS, HX OF 12/30/2006   Gout    H/O atrial flutter 08/13/2005   a. Ablations in 2007, 2008.   Heart murmur     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   "left shoulder"   PAF (paroxysmal atrial fibrillation) (HCC)    On coumadin   Sleep apnea    "suppose to send mask but they never did" (05/03/2015)   Type II diabetes mellitus (HCC)    Past Surgical History:  Procedure Laterality Date   A FLUTTER ABLATION  2007, 2008   catheter ablation    CARDIAC CATHETERIZATION N/A 05/03/2015   Procedure: Right/Left Heart Cath and Coronary Angiography;  Surgeon: [name], MD;  Location: [facility] INVASIVE CV LAB;  Service: Cardiovascular;  Laterality: N/A;   CARDIAC CATHETERIZATION N/A 05/03/2015   Procedure: Coronary Stent Intervention;  Surgeon: Corky Crafts, MD;  Location: [facility] INVASIVE CV LAB;  Service: Cardiovascular;  Laterality: N/A;   CARDIAC CATHETERIZATION N/A 11/04/2015   Procedure: Left Heart Cath and Coronary Angiography;  Surgeon: [name], MD;  Location: [facility] INVASIVE CV LAB;  Service: Cardiovascular;  Laterality: N/A;   CARDIOVERSION N/A 08/24/2015   Procedure: CARDIOVERSION;  Surgeon: Vesta Mixer, MD;  Location: [facility] ENDOSCOPY;  Service: Cardiovascular;  Laterality: N/A;   CARDIOVERSION N/A 12/14/2022   Procedure: CARDIOVERSION;  Surgeon: [name], MD;  Location: [facility] INVASIVE CV LAB;  Service: Cardiovascular;  Laterality: N/A;   CARDIOVERSION N/A 10/02/2023   Procedure: CARDIOVERSION;  Surgeon: [name], MD;  Location: [facility] INVASIVE CV LAB;  Service: Cardiovascular;  Laterality: N/A;   CHOLECYSTECTOMY N/A 02/07/2016   Procedure: LAPAROSCOPIC CHOLECYSTECTOMY WITH  INTRAOPERATIVE CHOLANGIOGRAM;  Surgeon: Gaynelle Adu, MD;  Location: [facility] ORS;  Service: General;   Laterality: N/A;   COLONOSCOPY WITH PROPOFOL N/A 04/28/2019   Procedure: COLONOSCOPY WITH PROPOFOL;  Surgeon: Kerin Salen, MD;  Location: [facility] ENDOSCOPY;  Service: Gastroenterology;  Laterality: N/A;   CORONARY ANGIOPLASTY  12/05/01   ESOPHAGOGASTRODUODENOSCOPY (EGD) WITH PROPOFOL N/A 04/27/2019   Procedure: ESOPHAGOGASTRODUODENOSCOPY (EGD) WITH PROPOFOL;  Surgeon: Kerin Salen, MD;  Location: [facility] ENDOSCOPY;  Service: Gastroenterology;  Laterality: N/A;   GIVENS CAPSULE STUDY N/A 06/29/2019   Procedure: GIVENS CAPSULE STUDY;  Surgeon: Charlott Rakes, MD;  Location: [facility] ENDOSCOPY;  Service: Endoscopy;  Laterality: N/A;   LEFT HEART CATH AND CORONARY ANGIOGRAPHY N/A 12/28/2016   Procedure: Left Heart Cath and Coronary Angiography;  Surgeon: [name], MD;  Location: [facility] INVASIVE CV LAB;  Service: Cardiovascular;  Laterality: N/A;   LEFT HEART CATHETERIZATION WITH CORONARY ANGIOGRAM N/A 12/19/2011   Procedure: LEFT HEART CATHETERIZATION WITH CORONARY ANGIOGRAM;  Surgeon: [name], MD;  Location: [facility] CATH LAB;  Service: Cardiovascular;  Laterality: N/A;   OSTEOCHONDROMA EXCISION Left 1972   "took bone tumor off my shoulder"   POLYPECTOMY  04/28/2019   Procedure: POLYPECTOMY;  Surgeon: Kerin Salen, MD;  Location: [facility] ENDOSCOPY;  Service: Gastroenterology;;   PRESSURE SENSOR/CARDIOMEMS N/A 02/01/2020   Procedure: PRESSURE SENSOR/CARDIOMEMS;  Surgeon: [name], MD;  Location: [facility] INVASIVE CV LAB;  Service: Cardiovascular;  Laterality: N/A;   RIGHT HEART CATH N/A 02/01/2020   Procedure: RIGHT HEART CATH;  Surgeon: [name], MD;  Location: [facility] INVASIVE CV LAB;  Service: Cardiovascular;  Laterality: N/A;   RIGHT/LEFT HEART CATH AND CORONARY ANGIOGRAPHY N/A 07/01/2019   Procedure: RIGHT/LEFT HEART CATH AND CORONARY ANGIOGRAPHY;  Surgeon: [name], MD;  Location: [facility] INVASIVE CV LAB;  Service: Cardiovascular;  Laterality: N/A;   TEE WITHOUT CARDIOVERSION N/A 12/14/2022   Procedure:  TRANSESOPHAGEAL ECHOCARDIOGRAM;  Surgeon: [name], MD;  Location: [facility] INVASIVE CV LAB;  Service: Cardiovascular;  Laterality: N/A;   TRANSESOPHAGEAL ECHOCARDIOGRAM (CATH LAB) N/A 10/02/2023   Procedure: TRANSESOPHAGEAL ECHOCARDIOGRAM;  Surgeon: [name], MD;  Location: [facility] INVASIVE CV LAB;  Service: Cardiovascular;  Laterality: N/A;   Social History   Tobacco Use   Smoking status: Former    Current packs/day: 0.00    Average packs/day: 4.0 packs/day for 18.0 years (72.0 ttl pk-yrs)    Types: Cigarettes    Start date: 08/13/1965    Quit date: 08/14/1983    Years since quitting: 40.2   Smokeless tobacco: Never  Vaping Use   Vaping status: Never Used  Substance Use Topics   Alcohol use: Yes    Alcohol/week: 16.0 standard drinks of alcohol    Types: 6 Cans of beer, 10 Shots of liquor per week    Comment: 2-3 beers watching sports; occasional glass of liquor; drinks heavily in football season   Drug use: Yes    Types: Marijuana    Comment: twice a week   Family History  Problem Relation Age of Onset   Diabetes Mother    Hypertension Mother    Heart attack Neg Hx    Stroke Neg Hx    Allergies  Allergen Reactions   Shrimp [Shellfish Allergy] Nausea And Vomiting   Ace Inhibitors Cough   Other Hives and Other (See Comments)    Patient reports developing hives after receiving "some antibiotic given in 1980''s at [facility]". He does not know which antibiotic.      ROS Negative unless stated above    Objective:     BP (!) 146/80 (BP Location: Left Arm, Patient Position: Sitting, Cuff Size: Normal)   Pulse (!) 52   Temp 98.3 F (36.8 C) (Oral)   Ht 5\' 11"  (1.803 m)   Wt 207 lb 3.2 oz (94 kg)   SpO2 97%   BMI 28.90 kg/m  BP Readings from Last 3 Encounters:  10/28/23 (!) 146/80  10/02/23 (!) 166/100  09/25/23 (!) 144/68   Wt Readings from Last 3 Encounters:  10/28/23 207 lb 3.2 oz (94 kg)  09/25/23 203 lb 12.8 oz (92.4 kg)  09/10/23 200 lb 12.8  oz (91.1 kg)      Physical Exam Constitutional:      General: He is not in acute distress.    Appearance: Normal appearance.  HENT:     Head: Normocephalic and atraumatic.     Nose: Nose normal.     Mouth/Throat:     Mouth: Mucous membranes are moist.  Cardiovascular:     Rate and Rhythm: Normal rate and regular rhythm.     Heart sounds: Normal heart sounds. No murmur heard.    No gallop.  Pulmonary:  Effort: Pulmonary effort is normal. No respiratory distress.     Breath sounds: Normal breath sounds. No wheezing, rhonchi or rales.  Skin:    General: Skin is warm and dry.  Neurological:     Mental Status: He is alert and oriented to person, place, and time.      Results for orders placed or performed in visit on 10/28/23  POC HgB A1c  Result Value Ref Range   Hemoglobin A1C 9.8 (A) 4.0 - 5.6 %   HbA1c POC (<> result, manual entry)     HbA1c, POC (prediabetic range)     HbA1c, POC (controlled diabetic range)        Assessment & Plan:  Type 2 diabetes mellitus without complication, unspecified whether long term insulin use (HCC) -     POCT glycosylated hemoglobin (Hb A1C) -     FreeStyle Libre 3 Sensor; 1 Device by Does not apply route every 14 (fourteen) days.  Dispense: 6 each; Refill: 1 -     Insulin Glargine; Inject 0.35 mLs (35 Units total) into the skin at bedtime.  History of TIA (transient ischemic attack)  Age-related cataract of both eyes, unspecified age-related cataract type  Essential hypertension  Chronic diastolic CHF (congestive heart failure) (HCC)  History of atrial fibrillation  Patient with uncontrolled DM2.  Hemoglobin A1c this visit 9.8%.  Discussed the importance of monitor during blood sugar regularly.  Refills for freestyle libre 3 sensors provided.  Chart changed to reflect current Lantus dosing.  Patient taking 30 units of Lantus daily not the 50 previously listed.  As such discussed increasing Lantus from 30 to 35 units daily.   Continue sliding scale Humalog.  If blood sugar 70-140 take 0 units, 141-1 70 take 1 unit, 171-200 take 2 units, 201-230 take 3 units, etc. continue Jardiance 25 mg daily.  Patient advised elevated blood sugar along with cataract may also be contributing to blurred vision.  Discussed timing of vision improvement with blood sugar control.  BP elevated.  Euvolemic.  Recheck.  Lifestyle modifications.  Continue current medications including Eliquis, Zetia, Lipitor 80 mg daily.  Patient advised to use recent pill packs to avoid medication confusion.  Follow-up in the next few months.  Return in about 3 months (around 01/28/2024).   Deeann Saint, MD

## 2023-10-28 NOTE — Patient Instructions (Addendum)
 Don't forget to schedule an appointment with your endocrinologist at [facility] endocrinology [phone]. A refill on the freestyle libre 3 sensors was sent to your [place] pharmacy.  Make sure you are taking the new pill packs and not the old medication.  I cannot see who the eye doctor was that you recently saw.  We talked about your dose of Lantus from 30 to 35 units nightly.

## 2023-11-08 ENCOUNTER — Telehealth: Payer: Self-pay

## 2023-11-08 NOTE — Progress Notes (Signed)
   11/08/2023  Patient ID: [name] [name], male   DOB: 01-14-1956, 68 y.o.   MRN: [id number]  Attempted to contact patient to schedule appointment for medication management and diabetes.Unable to leave vm.  Sherrill Raring, PharmD Clinical Pharmacist [phone]

## 2023-11-12 ENCOUNTER — Other Ambulatory Visit (HOSPITAL_COMMUNITY): Payer: Self-pay | Admitting: Cardiology

## 2023-11-14 ENCOUNTER — Other Ambulatory Visit: Payer: Self-pay | Admitting: "Endocrinology

## 2023-11-24 ENCOUNTER — Emergency Department (HOSPITAL_COMMUNITY)

## 2023-11-24 ENCOUNTER — Encounter (HOSPITAL_COMMUNITY): Payer: Self-pay | Admitting: Emergency Medicine

## 2023-11-24 ENCOUNTER — Emergency Department (HOSPITAL_COMMUNITY)
Admission: EM | Admit: 2023-11-24 | Discharge: 2023-11-24 | Disposition: A | Discharge status: Home / Self Care | Attending: Emergency Medicine | Admitting: Emergency Medicine

## 2023-11-24 ENCOUNTER — Other Ambulatory Visit: Payer: Self-pay

## 2023-11-24 DIAGNOSIS — I509 Heart failure, unspecified: Secondary | ICD-10-CM | POA: Insufficient documentation

## 2023-11-24 DIAGNOSIS — I110 Hypertensive heart disease with heart failure: Secondary | ICD-10-CM | POA: Diagnosis not present

## 2023-11-24 DIAGNOSIS — I10 Essential (primary) hypertension: Secondary | ICD-10-CM | POA: Diagnosis not present

## 2023-11-24 DIAGNOSIS — Z7984 Long term (current) use of oral hypoglycemic drugs: Secondary | ICD-10-CM | POA: Diagnosis not present

## 2023-11-24 DIAGNOSIS — M542 Cervicalgia: Secondary | ICD-10-CM | POA: Insufficient documentation

## 2023-11-24 DIAGNOSIS — R296 Repeated falls: Secondary | ICD-10-CM | POA: Diagnosis not present

## 2023-11-24 DIAGNOSIS — Z043 Encounter for examination and observation following other accident: Secondary | ICD-10-CM | POA: Diagnosis not present

## 2023-11-24 DIAGNOSIS — S199XXA Unspecified injury of neck, initial encounter: Secondary | ICD-10-CM | POA: Diagnosis not present

## 2023-11-24 DIAGNOSIS — Z794 Long term (current) use of insulin: Secondary | ICD-10-CM | POA: Diagnosis not present

## 2023-11-24 DIAGNOSIS — S299XXA Unspecified injury of thorax, initial encounter: Secondary | ICD-10-CM | POA: Diagnosis not present

## 2023-11-24 DIAGNOSIS — S0990XA Unspecified injury of head, initial encounter: Secondary | ICD-10-CM | POA: Insufficient documentation

## 2023-11-24 DIAGNOSIS — W19XXXA Unspecified fall, initial encounter: Secondary | ICD-10-CM | POA: Diagnosis not present

## 2023-11-24 DIAGNOSIS — S0993XA Unspecified injury of face, initial encounter: Secondary | ICD-10-CM | POA: Diagnosis not present

## 2023-11-24 DIAGNOSIS — R220 Localized swelling, mass and lump, head: Secondary | ICD-10-CM | POA: Diagnosis not present

## 2023-11-24 DIAGNOSIS — I2510 Atherosclerotic heart disease of native coronary artery without angina pectoris: Secondary | ICD-10-CM | POA: Insufficient documentation

## 2023-11-24 DIAGNOSIS — Z7901 Long term (current) use of anticoagulants: Secondary | ICD-10-CM | POA: Diagnosis not present

## 2023-11-24 DIAGNOSIS — H1132 Conjunctival hemorrhage, left eye: Secondary | ICD-10-CM | POA: Insufficient documentation

## 2023-11-24 LAB — URINALYSIS, ROUTINE W REFLEX MICROSCOPIC
Bacteria, UA: NONE SEEN
Bilirubin Urine: NEGATIVE
Glucose, UA: >=500 mg/dL — AB
Ketones, ur: 5 mg/dL — AB
Nitrite: NEGATIVE
Protein, ur: 100 mg/dL — AB
Specific Gravity, Urine: 1.017 (ref 1.005–1.030)
pH: 7 (ref 5.0–8.0)

## 2023-11-24 LAB — I-STAT CG4 LACTIC ACID, ED
Lactic Acid, Venous: 2.8 mmol/L (ref 0.5–1.9)
Lactic Acid, Venous: 2.3 mmol/L (ref 0.5–1.9)

## 2023-11-24 LAB — I-STAT CHEM 8, ED
BUN: 12 mg/dL (ref 8–23)
Calcium, Ion: 1.04 mmol/L — ABNORMAL LOW (ref 1.15–1.40)
Chloride: 107 mmol/L (ref 98–111)
Creatinine, Ser: 1.4 mg/dL — ABNORMAL HIGH (ref 0.61–1.24)
Glucose, Bld: 172 mg/dL — ABNORMAL HIGH (ref 70–99)
HCT: 53 % — ABNORMAL HIGH (ref 39.0–52.0)
Hemoglobin: 18 g/dL — ABNORMAL HIGH (ref 13.0–17.0)
Potassium: 4.1 mmol/L (ref 3.5–5.1)
Sodium: 139 mmol/L (ref 135–145)
TCO2: 21 mmol/L — ABNORMAL LOW (ref 22–32)

## 2023-11-24 LAB — COMPREHENSIVE METABOLIC PANEL WITH GFR
ALT: 17 U/L (ref 0–44)
AST: 27 U/L (ref 15–41)
Albumin: 3.4 g/dL — ABNORMAL LOW (ref 3.5–5.0)
Alkaline Phosphatase: 57 U/L (ref 38–126)
Anion gap: 14 (ref 5–15)
BUN: 10 mg/dL (ref 8–23)
CO2: 19 mmol/L — ABNORMAL LOW (ref 22–32)
Calcium: 9.5 mg/dL (ref 8.9–10.3)
Chloride: 105 mmol/L (ref 98–111)
Creatinine, Ser: 1.26 mg/dL — ABNORMAL HIGH (ref 0.61–1.24)
GFR, Estimated: >60 mL/min (ref >60)
Glucose, Bld: 171 mg/dL — ABNORMAL HIGH (ref 70–99)
Potassium: 4.2 mmol/L (ref 3.5–5.1)
Sodium: 138 mmol/L (ref 135–145)
Total Bilirubin: 1.1 mg/dL (ref 0.0–1.2)
Total Protein: 7.7 g/dL (ref 6.5–8.1)

## 2023-11-24 LAB — CBC
HCT: 54.3 % — ABNORMAL HIGH (ref 39.0–52.0)
Hemoglobin: 17 g/dL (ref 13.0–17.0)
MCH: 26.3 pg (ref 26.0–34.0)
MCHC: 31.3 g/dL (ref 30.0–36.0)
MCV: 83.9 fL (ref 80.0–100.0)
Platelets: 156 10*3/uL (ref 150–400)
RBC: 6.47 MIL/uL — ABNORMAL HIGH (ref 4.22–5.81)
RDW: 18.7 % — ABNORMAL HIGH (ref 11.5–15.5)
WBC: 7.1 10*3/uL (ref 4.0–10.5)
nRBC: 0 % (ref 0.0–0.2)

## 2023-11-24 LAB — PROTIME-INR
INR: 1.1 (ref 0.8–1.2)
Prothrombin Time: 14.5 s (ref 11.4–15.2)

## 2023-11-24 LAB — TROPONIN I (HIGH SENSITIVITY)
Troponin I (High Sensitivity): 25 ng/L — ABNORMAL HIGH (ref <18)
Troponin I (High Sensitivity): 31 ng/L — ABNORMAL HIGH (ref <18)

## 2023-11-24 LAB — ETHANOL: Alcohol, Ethyl (B): <10 mg/dL (ref <10)

## 2023-11-24 MED ORDER — SODIUM CHLORIDE 0.9 % IV BOLUS
1000.0000 mL | Freq: Once | INTRAVENOUS | Status: AC
  Administered 2023-11-24: 1000 mL via INTRAVENOUS

## 2023-11-24 MED ORDER — OXYCODONE-ACETAMINOPHEN 5-325 MG PO TABS: 1.0000 | ORAL_TABLET | Freq: Four times a day (QID) | ORAL | 0 refills | Status: DC | PRN

## 2023-11-24 MED ORDER — FENTANYL CITRATE PF 50 MCG/ML IJ SOSY
50.0000 ug | PREFILLED_SYRINGE | Freq: Once | INTRAMUSCULAR | Status: AC
  Administered 2023-11-24: 50 ug via INTRAVENOUS
  Filled 2023-11-24: qty 1

## 2023-11-24 MED ORDER — OXYCODONE-ACETAMINOPHEN 5-325 MG PO TABS
2.0000 | ORAL_TABLET | Freq: Once | ORAL | Status: AC
  Administered 2023-11-24: 2 via ORAL
  Filled 2023-11-24: qty 2

## 2023-11-24 NOTE — ED Triage Notes (Signed)
 Pt reports drinking alcohol yesterday and tripped. Pt unsure of what he fell on. Pt's left eye is swollen shut. Family at bedside states he was at a party and some people brought him home from falling so he must have fallen a few times. Endorses neck stiffness. Pt on eliquis.

## 2023-11-24 NOTE — ED Notes (Signed)
Discharge instructions reviewed with patient. Patient questions answered and opportunity for education reviewed. Patient voices understanding of discharge instructions with no further questions. Patient to lobby via wheelchair. 

## 2023-11-24 NOTE — Discharge Instructions (Signed)
 Please call for eye follow up with Dr. Elnita Hai at [facility] eye and retina at above number. Keep head of bed elevated. Avoid alcohol Return if you are worse at any time

## 2023-11-24 NOTE — ED Provider Notes (Signed)
 [place] [facility] AT [facility] Provider Note   CSN: [id number] Arrival date & time: 11/24/23  1110     History  Chief Complaint  Patient presents with   level 2 fall/thinners    [name] [name] is a 68 y.o. male.  HPI 68 year old male history of atrial fibrillation, on chronic anticoagulation, history of hep B, hypercholesterolemia, nonischemic cardiomyopathy, CHF, hypertension, coronary artery disease, presents today after fall last night.  Patient was at a party and reported significant alcohol intoxication.  He is reported fall at the party and then multiple times at home.  He was able to get himself to the bed.  He has bruising around the left eye and is complaining of neck pain.  Not reported to have any weakness or altered mental status.  He denies chest pain, dyspnea, abdominal pain, or extremity injury.     Home Medications Prior to Admission medications   Medication Sig Start Date End Date Taking? Authorizing Provider  oxyCODONE-acetaminophen (PERCOCET/ROXICET) 5-325 MG tablet Take 1 tablet by mouth every 6 (six) hours as needed for severe pain (pain score 7-10). 11/24/23  Yes Auston Blush, MD  ACCU-CHEK [facility] LANCETS lancets Use as instructed for three times daily testing of blood glucose 09/06/16   Jegede, Olugbemiga E, MD  acetaminophen (TYLENOL) 500 MG tablet Take 1,000 mg by mouth every 6 (six) hours as needed (pain).    [provider]  albuterol (VENTOLIN HFA) 108 (90 Base) MCG/ACT inhaler Inhale 1-2 puffs into the lungs every 6 (six) hours as needed for wheezing or shortness of breath. 04/06/20   Wilhelmenia Harada, Amy V, PA-C  amiodarone (PACERONE) 200 MG tablet Take 1 tablet (200 mg total) by mouth 2 (two) times daily for 10 days, THEN 1 tablet (200 mg total) daily. 09/10/23 09/08/24  Darlis Eisenmenger, MD  apixaban Herby Lolling) 5 MG TABS tablet TAKE ONE TABLET BY MOUTH TWICE DAILY 01/28/23   McLean, Dalton S, MD  atorvastatin (LIPITOR) 80 MG  tablet TAKE 1 TABLET BY MOUTH ONCE DAILY 04/30/23   Bensimhon, Rheta Celestine, MD  blood glucose meter kit and supplies KIT Dispense based on patient and insurance preference. Use up to four times daily as directed. (FOR ICD-9 250.00, 250.01). 07/20/20   Viola Greulich, MD  Blood Glucose Monitoring Suppl (ACCU-CHEK AVIVA PLUS) w/Device KIT Use as directed for 3 times daily testing of blood glucose 09/06/16   Jegede, Olugbemiga E, MD  carvedilol (COREG) 12.5 MG tablet TAKE 1 TABLET BY MOUTH TWICE DAILY WITH MEALS *PATIENT NEEDS APPOINTMENT* 11/13/23   Elmarie Hacking, FNP  colchicine 0.6 MG tablet On day 1 take 2 tablets at first sign of gout flare.  Take 1 tablet 1 hour later.  Then take 1 tablet daily starting the next day until resolution of gout flare. Patient not taking: Reported on 09/27/2023 05/24/21   Elmarie Hacking, FNP  Continuous Blood Gluc Receiver (FREESTYLE LIBRE 2 READER) DEVI Check your blood sugar three times per day before meals and once before bedtime 02/19/22   Viola Greulich, MD  Continuous Glucose Receiver (FREESTYLE LIBRE 3 READER) DEVI 1 Device by Does not apply route continuous. 09/05/23   Viola Greulich, MD  Continuous Glucose Sensor (FREESTYLE LIBRE 3 SENSOR) MISC 1 Device by Does not apply route every 14 (fourteen) days. 10/28/23   Viola Greulich, MD  eplerenone (INSPRA) 25 MG tablet Take 1 tablet (25 mg total) by mouth daily. 09/25/23   Elmarie Hacking, FNP  ezetimibe (ZETIA) 10 MG tablet TAKE 1 TABLET BY MOUTH ONCE DAILY 10/16/23   Darlis Eisenmenger, MD  glucose blood (ACCU-CHEK AVIVA PLUS) test strip Use as instructed for 3 times daily testing of blood glucose 09/06/16   Jegede, Olugbemiga E, MD  hydrALAZINE (APRESOLINE) 50 MG tablet Take 50 mg by mouth 2 (two) times daily. 09/13/23   [provider]  insulin glargine (LANTUS) 100 UNIT/ML injection Inject 0.35 mLs (35 Units total) into the skin at bedtime. 10/28/23   Viola Greulich, MD  insulin lispro (HUMALOG) 100  UNIT/ML injection Inject 1 Units into the skin 3 (three) times daily with meals. Sliding scale 70-140=0 Units 141-170=1 Units 171-200=2 Units 201-230=3 Units 231-260=4 Units 261-290=5 Units 291-320=6 Units 231-350=7 Units 351-380=8 Units Over 380 10 units    [provider]  Insulin Pen Needle (PEN NEEDLES) 32G X 4 MM MISC 1 each by Does not apply route daily. 06/08/22   Lajean Pike, MD  isosorbide mononitrate (IMDUR) 120 MG 24 hr tablet TAKE ONE TABLET BY MOUTH ONCE DAILY 01/28/23   McLean, Dalton S, MD  JARDIANCE 25 MG TABS tablet TAKE 1 TABLET BY MOUTH ONCE DAILY CONTACT OFFICE FOR APPOINTMENT 09/16/23   Jorge Newcomer, MD  Lancet Devices [facility]) lancets Use as instructed for 3 times daily testing of blood glucose 09/06/16   Jegede, Olugbemiga E, MD  losartan (COZAAR) 25 MG tablet Take 1 tablet (25 mg total) by mouth daily. 09/05/23 09/04/24  Viola Greulich, MD  metFORMIN (GLUCOPHAGE) 1000 MG tablet TAKE 1 TABLET BY MOUTH ONCE DAILY WITH BREAKFAST *PATIENT NEEDS APPOINTMENT FOR FURTHER REFILLS* 06/14/23   Jorge Newcomer, MD  nitroGLYCERIN (NITROSTAT) 0.4 MG SL tablet DISSOLVE 1 TABLET UNDER THE TONGUE AS NEEDED FOR CHEST PAIN EVERY 5 MINUTES UP TO 3 TIMES. IF NO RELIEF CALL 911. 07/16/23   Bensimhon, Daniel R, MD  potassium chloride SA (KLOR-CON M) 20 MEQ tablet TAKE 1 TABLET BY MOUTH ONCE DAILY 04/30/23   Bensimhon, Rheta Celestine, MD  torsemide (DEMADEX) 20 MG tablet Take 2 tablets (40 mg total) by mouth 2 (two) times daily. 07/29/23   Darlis Eisenmenger, MD  TRUEPLUS LANCETS 28G MISC 1 each by Does not apply route 3 (three) times daily. 08/22/16   Jegede, Olugbemiga E, MD      Allergies    Shrimp [shellfish allergy], Ace inhibitors, and Other    Review of Systems   Review of Systems  Physical Exam Updated Vital Signs BP (!) 178/102   Pulse 68   Temp 98.2 F (36.8 C) (Oral)   Resp (!) 21   Ht 1.803 m (5\' 11" )   Wt 90.7 kg   SpO2 96%   BMI 27.89 kg/m  Physical  Exam Vitals reviewed.  Constitutional:      General: He is not in acute distress. HENT:     Head:     Comments: No obvious trauma to head from forehead posterior     Right Ear: External ear normal.     Left Ear: External ear normal.     Nose: Nose normal.     Mouth/Throat:     Pharynx: Oropharynx is clear.  Eyes:     Pupils: Pupils are equal, round, and reactive to light.     Comments: Left periorbital swelling and eyelid edema. There are some conjunctival hemorrhage Pupils are small and equal  Neck:     Comments: No obvious step-offs Diffuse posterior tenderness Collar placed in ER Cardiovascular:  Rate and Rhythm: Normal rate. Rhythm irregular.     Pulses: Normal pulses.  Pulmonary:     Effort: Pulmonary effort is normal.     Breath sounds: Normal breath sounds.  Abdominal:     General: Abdomen is flat.     Palpations: Abdomen is soft.     Comments: No external signs of trauma or tenderness to palpation  Musculoskeletal:        General: No swelling, tenderness, deformity or signs of injury. Normal range of motion.  Skin:    General: Skin is warm.     Capillary Refill: Capillary refill takes less than 2 seconds.  Neurological:     General: No focal deficit present.     Mental Status: He is alert.  Psychiatric:        Mood and Affect: Mood normal.     ED Results / Procedures / Treatments   Labs (all labs ordered are listed, but only abnormal results are displayed) Labs Reviewed  COMPREHENSIVE METABOLIC PANEL WITH GFR - Abnormal; Notable for the following components:      Result Value   CO2 19 (*)    Glucose, Bld 171 (*)    Creatinine, Ser 1.26 (*)    Albumin 3.4 (*)    All other components within normal limits  CBC - Abnormal; Notable for the following components:   RBC 6.47 (*)    HCT 54.3 (*)    RDW 18.7 (*)    All other components within normal limits  URINALYSIS, ROUTINE W REFLEX MICROSCOPIC - Abnormal; Notable for the following components:    APPearance HAZY (*)    Glucose, UA >=500 (*)    Hgb urine dipstick SMALL (*)    Ketones, ur 5 (*)    Protein, ur 100 (*)    Leukocytes,Ua MODERATE (*)    All other components within normal limits  I-STAT CHEM 8, ED - Abnormal; Notable for the following components:   Creatinine, Ser 1.40 (*)    Glucose, Bld 172 (*)    Calcium, Ion 1.04 (*)    TCO2 21 (*)    Hemoglobin 18.0 (*)    HCT 53.0 (*)    All other components within normal limits  I-STAT CG4 LACTIC ACID, ED - Abnormal; Notable for the following components:   Lactic Acid, Venous 2.8 (*)    All other components within normal limits  I-STAT CG4 LACTIC ACID, ED - Abnormal; Notable for the following components:   Lactic Acid, Venous 2.3 (*)    All other components within normal limits  ETHANOL  PROTIME-INR  SAMPLE TO BLOOD BANK  TROPONIN I (HIGH SENSITIVITY)  TROPONIN I (HIGH SENSITIVITY)    EKG None  Radiology CT MAXILLOFACIAL WO CONTRAST Result Date: 11/24/2023 CLINICAL DATA:  Neck trauma, intoxicated or a tended. Fall on blood thinners. Swelling to the left eye. EXAM: CT HEAD WITHOUT CONTRAST CT MAXILLOFACIAL WITHOUT CONTRAST CT CERVICAL SPINE WITHOUT CONTRAST TECHNIQUE: Multidetector CT imaging of the head, cervical spine, and maxillofacial structures were performed using the standard protocol without intravenous contrast. Multiplanar CT image reconstructions of the cervical spine and maxillofacial structures were also generated. RADIATION DOSE REDUCTION: This exam was performed according to the departmental dose-optimization program which includes automated exposure control, adjustment of the mA and/or kV according to patient size and/or use of iterative reconstruction technique. COMPARISON:  06/02/2022 FINDINGS: CT HEAD FINDINGS Brain: No evidence of acute infarction, hemorrhage, hydrocephalus, extra-axial collection or mass lesion/mass effect. Generalized cerebral volume loss. Chronic small  vessel ischemia in the cerebral  white matter to a moderate degree. Vascular: No hyperdense vessel or unexpected calcification. Skull: Forehead swelling without acute fracture. CT MAXILLOFACIAL FINDINGS Osseous: No acute fracture or mandibular dislocation. Orbits: Symmetric proptosis, no retro-orbital injury seen. Sinuses: Mild mucosal thickening in the paranasal sinuses. Negative for hemosinus. Soft tissues: Soft tissue swelling to the forehead and glabella. No opaque foreign body CT CERVICAL SPINE FINDINGS Alignment: No traumatic malalignment Skull base and vertebrae: No acute fracture Soft tissues and spinal canal: No prevertebral fluid or swelling. No visible canal hematoma. Disc levels: Generalized degenerative and spondylitic endplate spurring. Facet osteoarthritis especially at C2-3. Upper chest: No evidence of injury IMPRESSION: No evidence of acute intracranial or cervical spine injury. Forehead swelling without facial fracture. Electronically Signed   By: Ronnette Coke M.D.   On: 11/24/2023 12:43   CT HEAD WO CONTRAST Result Date: 11/24/2023 CLINICAL DATA:  Neck trauma, intoxicated or a tended. Fall on blood thinners. Swelling to the left eye. EXAM: CT HEAD WITHOUT CONTRAST CT MAXILLOFACIAL WITHOUT CONTRAST CT CERVICAL SPINE WITHOUT CONTRAST TECHNIQUE: Multidetector CT imaging of the head, cervical spine, and maxillofacial structures were performed using the standard protocol without intravenous contrast. Multiplanar CT image reconstructions of the cervical spine and maxillofacial structures were also generated. RADIATION DOSE REDUCTION: This exam was performed according to the departmental dose-optimization program which includes automated exposure control, adjustment of the mA and/or kV according to patient size and/or use of iterative reconstruction technique. COMPARISON:  06/02/2022 FINDINGS: CT HEAD FINDINGS Brain: No evidence of acute infarction, hemorrhage, hydrocephalus, extra-axial collection or mass lesion/mass effect.  Generalized cerebral volume loss. Chronic small vessel ischemia in the cerebral white matter to a moderate degree. Vascular: No hyperdense vessel or unexpected calcification. Skull: Forehead swelling without acute fracture. CT MAXILLOFACIAL FINDINGS Osseous: No acute fracture or mandibular dislocation. Orbits: Symmetric proptosis, no retro-orbital injury seen. Sinuses: Mild mucosal thickening in the paranasal sinuses. Negative for hemosinus. Soft tissues: Soft tissue swelling to the forehead and glabella. No opaque foreign body CT CERVICAL SPINE FINDINGS Alignment: No traumatic malalignment Skull base and vertebrae: No acute fracture Soft tissues and spinal canal: No prevertebral fluid or swelling. No visible canal hematoma. Disc levels: Generalized degenerative and spondylitic endplate spurring. Facet osteoarthritis especially at C2-3. Upper chest: No evidence of injury IMPRESSION: No evidence of acute intracranial or cervical spine injury. Forehead swelling without facial fracture. Electronically Signed   By: Ronnette Coke M.D.   On: 11/24/2023 12:43   CT Cervical Spine Wo Contrast Result Date: 11/24/2023 CLINICAL DATA:  Neck trauma, intoxicated or a tended. Fall on blood thinners. Swelling to the left eye. EXAM: CT HEAD WITHOUT CONTRAST CT MAXILLOFACIAL WITHOUT CONTRAST CT CERVICAL SPINE WITHOUT CONTRAST TECHNIQUE: Multidetector CT imaging of the head, cervical spine, and maxillofacial structures were performed using the standard protocol without intravenous contrast. Multiplanar CT image reconstructions of the cervical spine and maxillofacial structures were also generated. RADIATION DOSE REDUCTION: This exam was performed according to the departmental dose-optimization program which includes automated exposure control, adjustment of the mA and/or kV according to patient size and/or use of iterative reconstruction technique. COMPARISON:  06/02/2022 FINDINGS: CT HEAD FINDINGS Brain: No evidence of acute  infarction, hemorrhage, hydrocephalus, extra-axial collection or mass lesion/mass effect. Generalized cerebral volume loss. Chronic small vessel ischemia in the cerebral white matter to a moderate degree. Vascular: No hyperdense vessel or unexpected calcification. Skull: Forehead swelling without acute fracture. CT MAXILLOFACIAL FINDINGS Osseous: No acute fracture or mandibular dislocation. Orbits: Symmetric  proptosis, no retro-orbital injury seen. Sinuses: Mild mucosal thickening in the paranasal sinuses. Negative for hemosinus. Soft tissues: Soft tissue swelling to the forehead and glabella. No opaque foreign body CT CERVICAL SPINE FINDINGS Alignment: No traumatic malalignment Skull base and vertebrae: No acute fracture Soft tissues and spinal canal: No prevertebral fluid or swelling. No visible canal hematoma. Disc levels: Generalized degenerative and spondylitic endplate spurring. Facet osteoarthritis especially at C2-3. Upper chest: No evidence of injury IMPRESSION: No evidence of acute intracranial or cervical spine injury. Forehead swelling without facial fracture. Electronically Signed   By: Tiburcio Pea M.D.   On: 11/24/2023 12:43   DG Chest Port 1 View Result Date: 11/24/2023 CLINICAL DATA:  Trauma EXAM: PORTABLE CHEST 1 VIEW COMPARISON:  None Available. FINDINGS: Normal cardiac silhouette. No pulmonary contusion or pleural fluid. No pneumothorax. Cardiac monitoring device in the LEFT lower lobe. No fracture identified. IMPRESSION: No acute cardiopulmonary process. Electronically Signed   By: Genevive Bi M.D.   On: 11/24/2023 12:17   DG Pelvis Portable Result Date: 11/24/2023 CLINICAL DATA:  Multiple falls. EXAM: PORTABLE PELVIS 1-2 VIEWS COMPARISON:  None Available. FINDINGS: There is no evidence of pelvic fracture or diastasis. No pelvic bone lesions are seen. IMPRESSION: Negative. Electronically Signed   By: Kennith Center M.D.   On: 11/24/2023 12:16    Procedures Procedures     Medications Ordered in ED Medications  sodium chloride 0.9 % bolus 1,000 mL (0 mLs Intravenous Stopped 11/24/23 1404)  fentaNYL (SUBLIMAZE) injection 50 mcg (50 mcg Intravenous Given 11/24/23 1408)  oxyCODONE-acetaminophen (PERCOCET/ROXICET) 5-325 MG per tablet 2 tablet (2 tablets Oral Given 11/24/23 1501)    ED Course/ Medical Decision Making/ A&P Clinical Course as of 11/24/23 1523  Sun Nov 24, 2023  1304 Lactic acid elevated 2.8, low index of suspicion for infection, was obtained secondary to trauma protocol.  Patient is receiving IV fluids [DR]  1304 CT maxillofacial, cervical spine, and head with no evidence of acute intracranial or cervical spine injury forehead swelling without facial fracture noted [DR]  1313 Discussed CT results with Dr. Mosetta Putt.  I asked him to specifically look at the orbits and globe.  He states that the globes appear to be symmetrical and there is no evidence of hemorrhage retrobulbar early or globe rupture. [DR]  1314 Chest x-Hunter Pinkard reviewed interpreted no evidence Canner by radiologist or potation of [DR]  1314 Pelvis x-Shalyn Koral reviewed interpreted negative radiologist or potation concurs [DR]  1454 Stable 67 YOM with a GLF Hematoma on eye Cts ok LA up s/p fluid [CC]  1521 Repeat opponent trending down at 2.3 [DR]    Clinical Course User Index [CC] Glyn Ade, MD [DR] Margarita Grizzle, MD                                 Medical Decision Making Amount and/or Complexity of Data Reviewed Labs: ordered. Radiology: ordered.  Risk Prescription drug management.   68 year old male multiple health problems including A-fib, on chronic anticoagulation, had significant alcohol ingestion and multiple falls last night.  Patient with contusion to periorbital area.  He had some neck pain.  He is evaluated here with CT scan of head, neck, maxillofacial.  Patient was examined has subconjunctival hematoma on the left.  I did discuss the orbit CT with the radiologist and  there is no sign of globe rupture or periorbital hematoma.  Patient has elevated lactic acid likely secondary to  the alcohol ingestion and poor p.o. intake.  Here he has received IV fluids.  He is given Percocet for pain.  His head is up in the bed and he feels improved.  His daughter is at the bedside and they are anxious to go. Will recheck lactic now the patient has had some fluid Plan Percocet for pain Patient and daughter advised that he should have the head of his bed elevated and have his eye rechecked by ophthalmology. We have discussed return precautions and need for follow-up and they voiced understanding.        Final Clinical Impression(s) / ED Diagnoses Final diagnoses:  Fall, initial encounter  Chronic anticoagulation  Subconjunctival hemorrhage of left eye    Rx / DC Orders ED Discharge Orders          Ordered    oxyCODONE-acetaminophen (PERCOCET/ROXICET) 5-325 MG tablet  Every 6 hours PRN        11/24/23 1522              Auston Blush, MD 11/24/23 1523

## 2023-11-24 NOTE — Progress Notes (Signed)
 Orthopedic Tech Progress Note Patient Details:  [name] [name] 09/06/1955 [id number]  Level II trauma, no ortho tech orders at this time.  Patient ID: [name] [name], male   DOB: [birth year], 68 y.o.   MRN: [id number]  [name] [name] 11/24/2023, 12:15 PM

## 2023-11-25 LAB — SAMPLE TO BLOOD BANK

## 2023-12-09 ENCOUNTER — Encounter: Payer: Self-pay | Admitting: Family Medicine

## 2023-12-09 ENCOUNTER — Ambulatory Visit (INDEPENDENT_AMBULATORY_CARE_PROVIDER_SITE_OTHER): Admitting: Family Medicine

## 2023-12-09 ENCOUNTER — Inpatient Hospital Stay: Admitting: Family Medicine

## 2023-12-09 VITALS — BP 158/74 | HR 68 | Temp 98.2°F | Ht 71.0 in | Wt 204.2 lb

## 2023-12-09 DIAGNOSIS — M25522 Pain in left elbow: Secondary | ICD-10-CM | POA: Diagnosis not present

## 2023-12-09 DIAGNOSIS — H539 Unspecified visual disturbance: Secondary | ICD-10-CM

## 2023-12-09 DIAGNOSIS — H1132 Conjunctival hemorrhage, left eye: Secondary | ICD-10-CM

## 2023-12-09 DIAGNOSIS — W19XXXD Unspecified fall, subsequent encounter: Secondary | ICD-10-CM

## 2023-12-09 DIAGNOSIS — Z7901 Long term (current) use of anticoagulants: Secondary | ICD-10-CM | POA: Diagnosis not present

## 2023-12-09 DIAGNOSIS — F1090 Alcohol use, unspecified, uncomplicated: Secondary | ICD-10-CM

## 2023-12-09 NOTE — Progress Notes (Signed)
 Established Patient Office Visit   Subjective  Patient ID: [name] [name], male    DOB: 1956-02-18  Age: 68 y.o. MRN: [id number]  Chief Complaint  Patient presents with   Hospitalization Follow-up    Fall     HPI Pt is a 68 yo male seen for ED f/u.  Pt seen in ED on 11/24/23 s/p fall a/w EtOH use.  Patient drank a pint and a half of gin.  Pt does not recall how he fell during a party at a family members house.  Patient had swelling of left eye.  CT head neck, maxillofacial and ED with a subconjunctival hematoma of left eye  Given IV fluids and Percocet for pain.  Initially unable to open left eye, now regaining vision in that eye.  Patient was unaware he was to contact ophthalmology to schedule a follow-up appointment.  Also notes left elbow pain. Patient Active Problem List   Diagnosis Date Noted   Wrist fracture, left, closed, initial encounter 06/05/2022   Rib pain 06/05/2022   Mobitz (type) [facility]) atrioventricular block 01/23/2022   Chest pain of uncertain etiology 01/22/2022   Syncope 02/09/2020   Generalized weakness 06/27/2019   Acute on chronic congestive heart failure with left ventricular diastolic dysfunction (HCC) 06/26/2019   Anemia due to chronic kidney disease 06/26/2019   CKD (chronic kidney disease), stage III (HCC) 06/26/2019   Type 2 diabetes mellitus without complication (HCC) 06/26/2019   Iron deficiency anemia due to chronic blood loss 06/26/2019   History of atrial fibrillation    History of type 2 diabetes mellitus    Anemia    Chest pain 04/24/2019   Overweight (BMI 25.0-29.9) 02/28/2017   Unstable angina (HCC) 08/03/2016   S/P laparoscopic cholecystectomy 02/07/2016   Paroxysmal A-fib (HCC) 01/22/2016   Coronary artery disease involving native coronary artery with unstable angina pectoris (HCC) 12/29/2015   Diabetes (HCC) 11/12/2015   Chronic anticoagulation 08/22/2015   Atrial flutter with controlled response (HCC) 08/22/2015   RUQ  pain    Cholelithiases- drain in place 06/04/2015   Essential hypertension    Chronic diastolic CHF (congestive heart failure) (HCC) 04/24/2015   Excessive daytime sleepiness 11/25/2014   Obstructive sleep apnea 11/25/2014   NICM (nonischemic cardiomyopathy) (HCC) 03/11/2014   Anticoagulation goal of INR 2 to 3 10/19/2013   Eczema 10/19/2013   Low libido 10/16/2013   Persistent atrial fibrillation (HCC) 10/06/2013   Rectal bleeding 12/18/2011   HYPERCHOLESTEROLEMIA 07/11/2010   DENTAL CARIES 06/30/2010   GOUT 05/05/2009   HEPATITIS B, CHRONIC 12/30/2006   OSTEOCHONDROMA 12/30/2006   Mitral valve disorder 12/30/2006   LIVER FUNCTION TESTS, ABNORMAL, HX OF 12/30/2006   History of colonic polyps 12/30/2006   COLONOSCOPY, HX OF 05/12/2001   Past Medical History:  Diagnosis Date   Anxiety    CAD (coronary artery disease), native coronary artery    a. Nonobstructive CAD by cath 2013 - diffuse distal and branch vessel CAD, no severe disease in the major coronaries, LV mild global hypokinesis, EF 45%. b. ETT-Sestamibi 5/14: EF 31%, small fixed inferior defect with no ischemia.   Chronic CHF (HCC)    a. Mixed ICM/NICM (?EtOH). EF 35% in 2008. Echo 5/13: EF 60-65%, mod LVH, EF 45% on V gram in 12/2011. EF 12/2012: EF 50-55%, mild LVH, inferobasal HK, mild MR. ETT-Ses 5/14 EF 41%. Cardiac MRI 5/14: EF 44%, mild global HK, subepicardial delayed enhancement in nonspecific RV insertion pattern.   COLONIC POLYPS, HX OF 12/30/2006  Gout    H/O atrial flutter 08/13/2005   a. Ablations in 2007, 2008.   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   "left  shoulder"   PAF (paroxysmal atrial fibrillation) (HCC)    On coumadin    Sleep apnea    "suppose to send mask but they never did" (05/03/2015)   Type II diabetes mellitus (HCC)    Past Surgical History:  Procedure Laterality Date   A FLUTTER ABLATION  2007, 2008   catheter ablation    CARDIAC CATHETERIZATION N/A 05/03/2015   Procedure: Right/Left Heart Cath and Coronary Angiography;  Surgeon: Darlis Eisenmenger, MD;  Location[facility] INVASIVE CV LAB;  Service: Cardiovascular;  Laterality: N/A;   CARDIAC CATHETERIZATION N/A 05/03/2015   Procedure: Coronary Stent Intervention;  Surgeon: Lucendia Rusk, MD;  Location: [facility] INVASIVE CV LAB;  Service: Cardiovascular;  Laterality: N/A;   CARDIAC CATHETERIZATION N/A 11/04/2015   Procedure: Left Heart Cath and Coronary Angiography;  Surgeon: Darlis Eisenmenger, MD;  Location: [facility] INVASIVE CV LAB;  Service: Cardiovascular;  Laterality: N/A;   CARDIOVERSION N/A 08/24/2015   Procedure: CARDIOVERSION;  Surgeon: Lake Pilgrim, MD;  Location: [facility] ENDOSCOPY;  Service: Cardiovascular;  Laterality: N/A;   CARDIOVERSION N/A 12/14/2022   Procedure: CARDIOVERSION;  Surgeon: Darlis Eisenmenger, MD;  Location: [facility] INVASIVE CV LAB;  Service: Cardiovascular;  Laterality: N/A;   CARDIOVERSION N/A 10/02/2023   Procedure: CARDIOVERSION;  Surgeon: Darlis Eisenmenger, MD;  Location: [facility] INVASIVE CV LAB;  Service: Cardiovascular;  Laterality: N/A;   CHOLECYSTECTOMY N/A 02/07/2016   Procedure: LAPAROSCOPIC CHOLECYSTECTOMY WITH  INTRAOPERATIVE CHOLANGIOGRAM;  Surgeon: Aldean Hummingbird, MD;  Location: [facility] ORS;  Service: General;  Laterality: N/A;   COLONOSCOPY WITH PROPOFOL  N/A 04/28/2019   Procedure: COLONOSCOPY WITH PROPOFOL ;  Surgeon: Genell Ken, MD;  Location: [facility] ENDOSCOPY;  Service: Gastroenterology;  Laterality: N/A;   CORONARY ANGIOPLASTY  12/05/01   ESOPHAGOGASTRODUODENOSCOPY (EGD) WITH PROPOFOL  N/A 04/27/2019   Procedure: ESOPHAGOGASTRODUODENOSCOPY (EGD) WITH PROPOFOL ;  Surgeon: Genell Ken,  MD;  Location: [facility] ENDOSCOPY;  Service: Gastroenterology;  Laterality: N/A;   GIVENS CAPSULE STUDY N/A 06/29/2019   Procedure: GIVENS CAPSULE STUDY;  Surgeon: Baldo Bonds, MD;  Location: [facility] ENDOSCOPY;  Service: Endoscopy;  Laterality: N/A;   LEFT HEART CATH AND CORONARY ANGIOGRAPHY N/A 12/28/2016   Procedure: Left Heart Cath and Coronary Angiography;  Surgeon: Darlis Eisenmenger, MD;  Location: [facility] INVASIVE CV LAB;  Service: Cardiovascular;  Laterality: N/A;   LEFT HEART CATHETERIZATION WITH CORONARY ANGIOGRAM N/A 12/19/2011   Procedure: LEFT HEART CATHETERIZATION WITH CORONARY ANGIOGRAM;  Surgeon: Darlis Eisenmenger, MD;  Location: [facility] CATH LAB;  Service: Cardiovascular;  Laterality: N/A;   OSTEOCHONDROMA EXCISION Left 1972   "took bone tumor off my shoulder"   POLYPECTOMY  04/28/2019   Procedure: POLYPECTOMY;  Surgeon: Genell Ken, MD;  Location: [facility] ENDOSCOPY;  Service: Gastroenterology;;   PRESSURE SENSOR/CARDIOMEMS N/A 02/01/2020   Procedure: PRESSURE SENSOR/CARDIOMEMS;  Surgeon: Darlis Eisenmenger, MD;  Location: [facility] INVASIVE CV LAB;  Service: Cardiovascular;  Laterality: N/A;   RIGHT HEART CATH N/A 02/01/2020   Procedure: RIGHT HEART CATH;  Surgeon:  Darlis Eisenmenger, MD;  Location: [facility] INVASIVE CV LAB;  Service: Cardiovascular;  Laterality: N/A;   RIGHT/LEFT HEART CATH AND CORONARY ANGIOGRAPHY N/A 07/01/2019   Procedure: RIGHT/LEFT HEART CATH AND CORONARY ANGIOGRAPHY;  Surgeon: Darlis Eisenmenger, MD;  Location: [facility] INVASIVE CV LAB;  Service: Cardiovascular;  Laterality: N/A;   TEE WITHOUT CARDIOVERSION N/A 12/14/2022   Procedure: TRANSESOPHAGEAL ECHOCARDIOGRAM;  Surgeon: Darlis Eisenmenger, MD;  Location: [facility] INVASIVE CV LAB;  Service: Cardiovascular;  Laterality: N/A;   TRANSESOPHAGEAL ECHOCARDIOGRAM (CATH LAB) N/A 10/02/2023   Procedure: TRANSESOPHAGEAL ECHOCARDIOGRAM;  Surgeon: Darlis Eisenmenger, MD;  Location: [facility] INVASIVE CV LAB;  Service: Cardiovascular;  Laterality: N/A;   Social History   Tobacco Use    Smoking status: Former    Current packs/day: 0.00    Average packs/day: 4.0 packs/day for 18.0 years (72.0 ttl pk-yrs)    Types: Cigarettes    Start date: 08/13/1965    Quit date: 08/14/1983    Years since quitting: 40.3   Smokeless tobacco: Never  Vaping Use   Vaping status: Never Used  Substance Use Topics   Alcohol use: Yes    Alcohol/week: 16.0 standard drinks of alcohol    Types: 6 Cans of beer, 10 Shots of liquor per week    Comment: 2-3 beers watching sports; occasional glass of liquor; drinks heavily in football season   Drug use: Yes    Types: Marijuana    Comment: twice a week   Family History  Problem Relation Age of Onset   Diabetes Mother    Hypertension Mother    Heart attack Neg Hx    Stroke Neg Hx    Allergies  Allergen Reactions   Shrimp [Shellfish Allergy] Nausea And Vomiting   Ace Inhibitors Cough   Other Hives and Other (See Comments)    Patient reports developing hives after receiving "some antibiotic given in 1980''s at [facility]". He does not know which antibiotic.      ROS Negative unless stated above    Objective:     BP (!) 158/74 (BP Location: Left Arm, Patient Position: Sitting, Cuff Size: Large)   Pulse 68   Temp 98.2 F (36.8 C) (Oral)   Ht 5\' 11"  (1.803 m)   Wt 204 lb 3.2 oz (92.6 kg)   SpO2 97%   BMI 28.48 kg/m  BP Readings from Last 3 Encounters:  12/09/23 (!) 158/74  11/24/23 (!) 179/108  10/28/23 (!) 146/80   Wt Readings from Last 3 Encounters:  12/09/23 204 lb 3.2 oz (92.6 kg)  11/24/23 200 lb (90.7 kg)  10/28/23 207 lb 3.2 oz (94 kg)    Physical Exam Constitutional:      General: He is not in acute distress.    Appearance: Normal appearance.  HENT:     Head: Normocephalic and atraumatic.     Nose: Nose normal.     Mouth/Throat:     Mouth: Mucous membranes are moist.  Eyes:     Extraocular Movements: Extraocular movements intact.     Pupils: Pupils are equal, round, and reactive to light.      Comments: Mild edema of orbital ridge left eye.  Left subconjunctival hematoma.  Cardiovascular:     Rate and Rhythm: Normal rate and regular rhythm.     Heart sounds: Normal heart sounds. No murmur heard.    No gallop.  Pulmonary:     Effort: Pulmonary effort is normal. No respiratory distress.     Breath sounds: Normal  breath sounds. No wheezing, rhonchi or rales.  Musculoskeletal:     Right elbow: Normal.     Left elbow: Normal range of motion. Tenderness present in lateral epicondyle and olecranon process.  Skin:    General: Skin is warm and dry.  Neurological:     Mental Status: He is alert and oriented to person, place, and time.       12/09/2023    5:02 PM 10/28/2023   12:16 PM 09/05/2023   12:59 PM  Depression screen PHQ 2/9  Decreased Interest 0 0 0  Down, Depressed, Hopeless 0 0 0  PHQ - 2 Score 0 0 0  Altered sleeping 1 1 2   Tired, decreased energy 1 1 2   Change in appetite 0 1 0  Feeling bad or failure about yourself  0 0 0  Trouble concentrating 0 0 0  Moving slowly or fidgety/restless 0 1 0  Suicidal thoughts 0 0 0  PHQ-9 Score 2 4 4   Difficult doing work/chores Not difficult at all Not difficult at all Not difficult at all      12/09/2023    5:02 PM 10/28/2023   12:16 PM 09/05/2023   12:59 PM 11/16/2022    9:16 AM  GAD 7 : Generalized Anxiety Score  Nervous, Anxious, on Edge 0 0 0 0  Control/stop worrying 0 0 0 0  Worry too much - different things 0 0 2 0  Trouble relaxing 0 0 0 1  Restless 0 0 0 0  Easily annoyed or irritable 0 0 0 0  Afraid - awful might happen 0 1 0 1  Total GAD 7 Score 0 1 2 2   Anxiety Difficulty Not difficult at all Not difficult at all Not difficult at all Not difficult at all    No results found for any visits on 12/09/23.    Assessment & Plan:  Subconjunctival hemorrhage of left eye -     Ambulatory referral to Ophthalmology  Change in vision  Chronic anticoagulation  Fall, subsequent encounter  Left elbow  pain  Alcohol use disorder  Pt with changes in vision and subconjunctival hemorrhage of left eye status post fall due to alcohol use.  Discussed the importance of cutting down on alcohol intake.  Patient has refrain from drinking alcohol since incident.  Referred to ophthalmology.  Tenderness of left elbow without deformity.  Discussed obtaining imaging.  X-ray unavailable in clinic during visit.  Patient wishes to wait.  Given strict precautions.  Return if symptoms worsen or fail to improve.   Viola Greulich, MD

## 2023-12-11 ENCOUNTER — Other Ambulatory Visit (HOSPITAL_COMMUNITY): Payer: Self-pay | Admitting: Family Medicine

## 2023-12-11 ENCOUNTER — Other Ambulatory Visit: Payer: Self-pay | Admitting: Cardiology

## 2023-12-11 DIAGNOSIS — I5032 Chronic diastolic (congestive) heart failure: Secondary | ICD-10-CM

## 2023-12-17 ENCOUNTER — Other Ambulatory Visit: Payer: Self-pay | Admitting: Cardiology

## 2023-12-17 DIAGNOSIS — I5032 Chronic diastolic (congestive) heart failure: Secondary | ICD-10-CM

## 2023-12-26 ENCOUNTER — Other Ambulatory Visit: Payer: Self-pay | Admitting: Cardiology

## 2024-01-01 ENCOUNTER — Ambulatory Visit: Payer: Self-pay

## 2024-01-01 NOTE — Telephone Encounter (Signed)
Patient has appt 5-22

## 2024-01-01 NOTE — Telephone Encounter (Addendum)
 Copied from CRM [id number]. Topic: Clinical - Medical Advice >> Jan 01, 2024  3:11 PM Rosaria Common wrote: Reason for CRM: Pt's left arm is swollen and in pain and getting worse. Warm transfer to nurse.    Chief Complaint: Left arm pain. Will call back for appointment when he knows his schedule. Symptoms: above, fell a few weeks ago Frequency: Pain is worse Pertinent Negatives: Patient denies  Disposition: [] ED /[] Urgent Care (no appt availability in office) / [] Appointment(In office/virtual)/ []  [facility]/ [] Home Care/ [] Refused Recommended Disposition /[] [facility]/ [x]  Follow-up with PCP Additional Notes: Will call back for appointment.  Reason for Disposition  [1] MODERATE pain (e.g., interferes with normal activities) AND [2] present > 3 days  Answer Assessment - Initial Assessment Questions 1. ONSET: "When did the pain start?"     1 week 2. LOCATION: "Where is the pain located?"     Left arm 3. PAIN: "How bad is the pain?" (Scale 1-10; or mild, moderate, severe)   - MILD (1-3): Doesn't interfere with normal activities.   - MODERATE (4-7): Interferes with normal activities (e.g., work or school) or awakens from sleep.   - SEVERE (8-10): Excruciating pain, unable to do any normal activities, unable to hold a cup of water .     Now - 7-8 4. WORK OR EXERCISE: "Has there been any recent work or exercise that involved this part of the body?"     No 5. CAUSE: "What do you think is causing the arm pain?"     Fell  6. OTHER SYMPTOMS: "Do you have any other symptoms?" (e.g., neck pain, swelling, rash, fever, numbness, weakness)     swelling 7. PREGNANCY: "Is there any chance you are pregnant?" "When was your last menstrual period?"     N/a  Protocols used: Arm Pain-A-AH

## 2024-01-02 ENCOUNTER — Encounter: Payer: Self-pay | Admitting: Family Medicine

## 2024-01-02 ENCOUNTER — Emergency Department (HOSPITAL_BASED_OUTPATIENT_CLINIC_OR_DEPARTMENT_OTHER)
Admission: EM | Admit: 2024-01-02 | Discharge: 2024-01-02 | Disposition: A | Discharge status: Home / Self Care | Attending: Emergency Medicine | Admitting: Emergency Medicine

## 2024-01-02 ENCOUNTER — Ambulatory Visit (INDEPENDENT_AMBULATORY_CARE_PROVIDER_SITE_OTHER): Admitting: Family Medicine

## 2024-01-02 ENCOUNTER — Ambulatory Visit (INDEPENDENT_AMBULATORY_CARE_PROVIDER_SITE_OTHER)

## 2024-01-02 ENCOUNTER — Emergency Department (HOSPITAL_BASED_OUTPATIENT_CLINIC_OR_DEPARTMENT_OTHER): Admitting: Radiology

## 2024-01-02 ENCOUNTER — Other Ambulatory Visit: Payer: Self-pay

## 2024-01-02 VITALS — BP 164/82 | HR 69 | Temp 97.8°F | Ht 71.0 in | Wt 197.2 lb

## 2024-01-02 DIAGNOSIS — I110 Hypertensive heart disease with heart failure: Secondary | ICD-10-CM | POA: Diagnosis not present

## 2024-01-02 DIAGNOSIS — Z7984 Long term (current) use of oral hypoglycemic drugs: Secondary | ICD-10-CM | POA: Insufficient documentation

## 2024-01-02 DIAGNOSIS — M25422 Effusion, left elbow: Secondary | ICD-10-CM | POA: Diagnosis not present

## 2024-01-02 DIAGNOSIS — Z21 Asymptomatic human immunodeficiency virus [HIV] infection status: Secondary | ICD-10-CM | POA: Insufficient documentation

## 2024-01-02 DIAGNOSIS — F1090 Alcohol use, unspecified, uncomplicated: Secondary | ICD-10-CM | POA: Diagnosis not present

## 2024-01-02 DIAGNOSIS — M25522 Pain in left elbow: Secondary | ICD-10-CM | POA: Diagnosis not present

## 2024-01-02 DIAGNOSIS — I10 Essential (primary) hypertension: Secondary | ICD-10-CM | POA: Diagnosis not present

## 2024-01-02 DIAGNOSIS — R079 Chest pain, unspecified: Secondary | ICD-10-CM

## 2024-01-02 DIAGNOSIS — Z7901 Long term (current) use of anticoagulants: Secondary | ICD-10-CM | POA: Insufficient documentation

## 2024-01-02 DIAGNOSIS — M19022 Primary osteoarthritis, left elbow: Secondary | ICD-10-CM | POA: Diagnosis not present

## 2024-01-02 DIAGNOSIS — I509 Heart failure, unspecified: Secondary | ICD-10-CM | POA: Insufficient documentation

## 2024-01-02 DIAGNOSIS — I700 Atherosclerosis of aorta: Secondary | ICD-10-CM | POA: Diagnosis not present

## 2024-01-02 DIAGNOSIS — S59902D Unspecified injury of left elbow, subsequent encounter: Secondary | ICD-10-CM | POA: Diagnosis not present

## 2024-01-02 DIAGNOSIS — Z79899 Other long term (current) drug therapy: Secondary | ICD-10-CM | POA: Insufficient documentation

## 2024-01-02 DIAGNOSIS — Z794 Long term (current) use of insulin: Secondary | ICD-10-CM | POA: Insufficient documentation

## 2024-01-02 DIAGNOSIS — R0789 Other chest pain: Secondary | ICD-10-CM | POA: Diagnosis not present

## 2024-01-02 DIAGNOSIS — E119 Type 2 diabetes mellitus without complications: Secondary | ICD-10-CM | POA: Insufficient documentation

## 2024-01-02 DIAGNOSIS — I480 Paroxysmal atrial fibrillation: Secondary | ICD-10-CM | POA: Diagnosis not present

## 2024-01-02 DIAGNOSIS — I771 Stricture of artery: Secondary | ICD-10-CM | POA: Diagnosis not present

## 2024-01-02 DIAGNOSIS — I2510 Atherosclerotic heart disease of native coronary artery without angina pectoris: Secondary | ICD-10-CM | POA: Insufficient documentation

## 2024-01-02 DIAGNOSIS — I517 Cardiomegaly: Secondary | ICD-10-CM | POA: Diagnosis not present

## 2024-01-02 DIAGNOSIS — R42 Dizziness and giddiness: Secondary | ICD-10-CM

## 2024-01-02 LAB — BASIC METABOLIC PANEL WITH GFR
Anion gap: 12 (ref 5–15)
BUN: 10 mg/dL (ref 8–23)
CO2: 25 mmol/L (ref 22–32)
Calcium: 9.5 mg/dL (ref 8.9–10.3)
Chloride: 100 mmol/L (ref 98–111)
Creatinine, Ser: 1.46 mg/dL — ABNORMAL HIGH (ref 0.61–1.24)
GFR, Estimated: 52 mL/min — ABNORMAL LOW (ref >60)
Glucose, Bld: 289 mg/dL — ABNORMAL HIGH (ref 70–99)
Potassium: 3.7 mmol/L (ref 3.5–5.1)
Sodium: 138 mmol/L (ref 135–145)

## 2024-01-02 LAB — CBC
HCT: 52 % (ref 39.0–52.0)
Hemoglobin: 16.9 g/dL (ref 13.0–17.0)
MCH: 27.3 pg (ref 26.0–34.0)
MCHC: 32.5 g/dL (ref 30.0–36.0)
MCV: 84 fL (ref 80.0–100.0)
Platelets: 146 10*3/uL — ABNORMAL LOW (ref 150–400)
RBC: 6.19 MIL/uL — ABNORMAL HIGH (ref 4.22–5.81)
RDW: 19.5 % — ABNORMAL HIGH (ref 11.5–15.5)
WBC: 6.6 10*3/uL (ref 4.0–10.5)
nRBC: 0 % (ref 0.0–0.2)

## 2024-01-02 LAB — TROPONIN T, HIGH SENSITIVITY
Troponin T High Sensitivity: 23 ng/L — ABNORMAL HIGH (ref <19)
Troponin T High Sensitivity: 22 ng/L — ABNORMAL HIGH (ref <19)

## 2024-01-02 MED ORDER — HYDRALAZINE HCL 25 MG PO TABS
50.0000 mg | ORAL_TABLET | Freq: Once | ORAL | Status: AC
  Administered 2024-01-02: 50 mg via ORAL
  Filled 2024-01-02: qty 2

## 2024-01-02 NOTE — ED Notes (Signed)
 Pt called multiple times in lobby, registration advised that pt was in the bathroom and then told them he was going out to his car, pt has still not returned to lobby.

## 2024-01-02 NOTE — Progress Notes (Signed)
 Established Patient Office Visit   Subjective  Patient ID: [name] [name], male    DOB: 23-Sep-1955  Age: 68 y.o. MRN: [id number]  Chief Complaint  Patient presents with   Arm Pain    Right elbow pain, patient had a fall, started 4 days ago, rate of pain 8 out of 10, patient can not straighten arm     Patient is a 68 year old male seen for follow-up.  Pt seen in ED 11/24/2023 s/p fall while intoxicated.  Patient with continued left elbow pain, increased warmth, and edema.  Patient has history of gout but states this feels different.  Unable to fully extend or flex LUE at elbow.  States pain in elbow goes down into forearm and up to shoulder and left chest.  Taking Tylenol  which is not helping.  X-ray was unavailable at time of last OFV.  Patient states he is drinking 3 beers per day.  Endorses constant dizziness.  Using a stick to aid with ambulation at home as does not have a cane.  Patient endorses left chest pain x 3 days.  Noted as sharp 3/10 sensation that comes and goes.  Improves with sleep.  Also had shortness of breath last night.     Patient Active Problem List   Diagnosis Date Noted   Wrist fracture, left, closed, initial encounter 06/05/2022   Rib pain 06/05/2022   Mobitz (type) I [facility]) atrioventricular block 01/23/2022   Chest pain of uncertain etiology 01/22/2022   Syncope 02/09/2020   Generalized weakness 06/27/2019   Acute on chronic congestive heart failure with left ventricular diastolic dysfunction (HCC) 06/26/2019   Anemia due to chronic kidney disease 06/26/2019   CKD (chronic kidney disease), stage III (HCC) 06/26/2019   Type 2 diabetes mellitus without complication (HCC) 06/26/2019   Iron deficiency anemia due to chronic blood loss 06/26/2019   History of atrial fibrillation    History of type 2 diabetes mellitus    Anemia    Chest pain 04/24/2019   Overweight (BMI 25.0-29.9) 02/28/2017   Unstable angina (HCC) 08/03/2016   S/P  laparoscopic cholecystectomy 02/07/2016   Paroxysmal A-fib (HCC) 01/22/2016   Coronary artery disease involving native coronary artery with unstable angina pectoris (HCC) 12/29/2015   Diabetes (HCC) 11/12/2015   Chronic anticoagulation 08/22/2015   Atrial flutter with controlled response (HCC) 08/22/2015   RUQ pain    Cholelithiases- drain in place 06/04/2015   Essential hypertension    Chronic diastolic CHF (congestive heart failure) (HCC) 04/24/2015   Excessive daytime sleepiness 11/25/2014   Obstructive sleep apnea 11/25/2014   NICM (nonischemic cardiomyopathy) (HCC) 03/11/2014   Anticoagulation goal of INR 2 to 3 10/19/2013   Eczema 10/19/2013   Low libido 10/16/2013   Persistent atrial fibrillation (HCC) 10/06/2013   Rectal bleeding 12/18/2011   HYPERCHOLESTEROLEMIA 07/11/2010   DENTAL CARIES 06/30/2010   GOUT 05/05/2009   HEPATITIS B, CHRONIC 12/30/2006   OSTEOCHONDROMA 12/30/2006   Mitral valve disorder 12/30/2006   LIVER FUNCTION TESTS, ABNORMAL, HX OF 12/30/2006   History of colonic polyps 12/30/2006   COLONOSCOPY, HX OF 05/12/2001   Past Medical History:  Diagnosis Date   Anxiety    CAD (coronary artery disease), native coronary artery    a. Nonobstructive CAD by cath 2013 - diffuse distal and branch vessel CAD, no severe disease in the major coronaries, LV mild global hypokinesis, EF 45%. b. ETT-Sestamibi 5/14: EF 31%, small fixed inferior defect with no ischemia.   Chronic CHF (HCC)  a. Mixed ICM/NICM (?EtOH). EF 35% in 2008. Echo 5/13: EF 60-65%, mod LVH, EF 45% on V gram in 12/2011. EF 12/2012: EF 50-55%, mild LVH, inferobasal HK, mild MR. ETT-Ses 5/14 EF 41%. Cardiac MRI 5/14: EF 44%, mild global HK, subepicardial delayed enhancement in nonspecific RV insertion pattern.   COLONIC POLYPS, HX OF 12/30/2006   Gout    H/O atrial flutter 08/13/2005   a. Ablations in 2007, 2008.   Heart murmur    HEPATITIS B, CHRONIC 12/30/2006   History of alcohol abuse    History  of hiatal hernia    History of medication noncompliance    HIV infection (HCC)    HYPERCHOLESTEROLEMIA 07/11/2010   Hypertension    Left sciatic nerve pain since 04/2015   LIVER FUNCTION TESTS, ABNORMAL, HX OF 12/30/2006   MITRAL REGURGITATION 12/30/2006   Osteochondrosarcoma (HCC) 08/13/1970   "left shoulder"   PAF (paroxysmal atrial fibrillation) (HCC)    On coumadin    Sleep apnea    "suppose to send mask but they never did" (05/03/2015)   Type II diabetes mellitus (HCC)    Past Surgical History:  Procedure Laterality Date   A FLUTTER ABLATION  2007, 2008   catheter ablation    CARDIAC CATHETERIZATION N/A 05/03/2015   Procedure: Right/Left Heart Cath and Coronary Angiography;  Surgeon: Darlis Eisenmenger, MD;  Location: [facility] INVASIVE CV LAB;  Service: Cardiovascular;  Laterality: N/A;   CARDIAC CATHETERIZATION N/A 05/03/2015   Procedure: Coronary Stent Intervention;  Surgeon: Lucendia Rusk, MD;  Location: [facility] INVASIVE CV LAB;  Service: Cardiovascular;  Laterality: N/A;   CARDIAC CATHETERIZATION N/A 11/04/2015   Procedure: Left Heart Cath and Coronary Angiography;  Surgeon: Darlis Eisenmenger, MD;  Location: [facility] INVASIVE CV LAB;  Service: Cardiovascular;  Laterality: N/A;   CARDIOVERSION N/A 08/24/2015   Procedure: CARDIOVERSION;  Surgeon: Lake Pilgrim, MD;  Location: [facility] ENDOSCOPY;  Service: Cardiovascular;  Laterality: N/A;   CARDIOVERSION N/A 12/14/2022   Procedure: CARDIOVERSION;  Surgeon: Darlis Eisenmenger, MD;  Location: [facility] INVASIVE CV LAB;  Service: Cardiovascular;  Laterality: N/A;   CARDIOVERSION N/A 10/02/2023   Procedure: CARDIOVERSION;  Surgeon: Darlis Eisenmenger, MD;  Location: [facility] INVASIVE CV LAB;  Service: Cardiovascular;  Laterality: N/A;   CHOLECYSTECTOMY N/A 02/07/2016   Procedure: LAPAROSCOPIC CHOLECYSTECTOMY WITH  INTRAOPERATIVE CHOLANGIOGRAM;  Surgeon: Aldean Hummingbird, MD;  Location: [facility] ORS;  Service: General;  Laterality: N/A;   COLONOSCOPY WITH PROPOFOL  N/A 04/28/2019    Procedure: COLONOSCOPY WITH PROPOFOL ;  Surgeon: Genell Ken, MD;  Location: [facility] ENDOSCOPY;  Service: Gastroenterology;  Laterality: N/A;   CORONARY ANGIOPLASTY  12/05/01   ESOPHAGOGASTRODUODENOSCOPY (EGD) WITH PROPOFOL  N/A 04/27/2019   Procedure: ESOPHAGOGASTRODUODENOSCOPY (EGD) WITH PROPOFOL ;  Surgeon: Genell Ken, MD;  Location: [facility] ENDOSCOPY;  Service: Gastroenterology;  Laterality: N/A;   GIVENS CAPSULE STUDY N/A 06/29/2019   Procedure: GIVENS CAPSULE STUDY;  Surgeon: Baldo Bonds, MD;  Location: [facility] ENDOSCOPY;  Service: Endoscopy;  Laterality: N/A;   LEFT HEART CATH AND CORONARY ANGIOGRAPHY N/A 12/28/2016   Procedure: Left Heart Cath and Coronary Angiography;  Surgeon: Darlis Eisenmenger, MD;  Location: [facility] INVASIVE CV LAB;  Service: Cardiovascular;  Laterality: N/A;   LEFT HEART CATHETERIZATION WITH CORONARY ANGIOGRAM N/A 12/19/2011   Procedure: LEFT HEART CATHETERIZATION WITH CORONARY ANGIOGRAM;  Surgeon: Darlis Eisenmenger, MD;  Location: [facility] CATH LAB;  Service: Cardiovascular;  Laterality: N/A;   OSTEOCHONDROMA EXCISION Left 1972   "took bone tumor off my shoulder"   POLYPECTOMY  04/28/2019  Procedure: POLYPECTOMY;  Surgeon: Genell Ken, MD;  Location: [facility] ENDOSCOPY;  Service: Gastroenterology;;   PRESSURE SENSOR/CARDIOMEMS N/A 02/01/2020   Procedure: PRESSURE SENSOR/CARDIOMEMS;  Surgeon: Darlis Eisenmenger, MD;  Location: [facility] INVASIVE CV LAB;  Service: Cardiovascular;  Laterality: N/A;   RIGHT HEART CATH N/A 02/01/2020   Procedure: RIGHT HEART CATH;  Surgeon: Darlis Eisenmenger, MD;  Location: [facility] INVASIVE CV LAB;  Service: Cardiovascular;  Laterality: N/A;   RIGHT/LEFT HEART CATH AND CORONARY ANGIOGRAPHY N/A 07/01/2019   Procedure: RIGHT/LEFT HEART CATH AND CORONARY ANGIOGRAPHY;  Surgeon: Darlis Eisenmenger, MD;  Location: [facility] INVASIVE CV LAB;  Service: Cardiovascular;  Laterality: N/A;   TEE WITHOUT CARDIOVERSION N/A 12/14/2022   Procedure: TRANSESOPHAGEAL ECHOCARDIOGRAM;  Surgeon: Darlis Eisenmenger, MD;   Location: [facility] INVASIVE CV LAB;  Service: Cardiovascular;  Laterality: N/A;   TRANSESOPHAGEAL ECHOCARDIOGRAM (CATH LAB) N/A 10/02/2023   Procedure: TRANSESOPHAGEAL ECHOCARDIOGRAM;  Surgeon: Darlis Eisenmenger, MD;  Location: [facility] INVASIVE CV LAB;  Service: Cardiovascular;  Laterality: N/A;   Social History   Tobacco Use   Smoking status: Former    Current packs/day: 0.00    Average packs/day: 4.0 packs/day for 18.0 years (72.0 ttl pk-yrs)    Types: Cigarettes    Start date: 08/13/1965    Quit date: 08/14/1983    Years since quitting: 40.4   Smokeless tobacco: Never  Vaping Use   Vaping status: Never Used  Substance Use Topics   Alcohol use: Yes    Alcohol/week: 16.0 standard drinks of alcohol    Types: 6 Cans of beer, 10 Shots of liquor per week    Comment: 2-3 beers watching sports; occasional glass of liquor; drinks heavily in football season   Drug use: Yes    Types: Marijuana    Comment: twice a week   Family History  Problem Relation Age of Onset   Diabetes Mother    Hypertension Mother    Heart attack Neg Hx    Stroke Neg Hx    Allergies  Allergen Reactions   Shrimp [Shellfish Allergy] Nausea And Vomiting   Ace Inhibitors Cough   Other Hives and Other (See Comments)    Patient reports developing hives after receiving "some antibiotic given in 1980''s at [facility]". He does not know which antibiotic.    ROS Negative unless stated above    Objective:      BP (!) 164/82 (BP Location: Right Arm, Patient Position: Sitting, Cuff Size: Normal) Comment: patient is in pain  Pulse 69   Temp 97.8 F (36.6 C) (Oral)   Ht 5\' 11"  (1.803 m)   Wt 197 lb 3.2 oz (89.4 kg)   BMI 27.50 kg/m  BP Readings from Last 3 Encounters:  01/02/24 (!) 164/82  12/09/23 (!) 158/74  11/24/23 (!) 179/108   Wt Readings from Last 3 Encounters:  01/02/24 197 lb 3.2 oz (89.4 kg)  12/09/23 204 lb 3.2 oz (92.6 kg)  11/24/23 200 lb (90.7 kg)      Physical Exam Constitutional:       Appearance: Normal appearance.  HENT:     Head: Normocephalic and atraumatic.     Mouth/Throat:     Mouth: Mucous membranes are moist.  Cardiovascular:     Rate and Rhythm: Rhythm irregular.  Pulmonary:     Effort: Pulmonary effort is normal.     Breath sounds: Normal breath sounds.  Chest:     Comments: No TTP of chest wall. Musculoskeletal:     Left elbow:  Swelling present. Decreased range of motion. Tenderness present.     Comments: Edema, increased warmth, TTP of L posterior elbow at olecranon process.  Skin:    General: Skin is warm and dry.  Neurological:     Mental Status: He is alert and oriented to person, place, and time.        12/09/2023    5:02 PM 10/28/2023   12:16 PM 09/05/2023   12:59 PM  Depression screen PHQ 2/9  Decreased Interest 0 0 0  Down, Depressed, Hopeless 0 0 0  PHQ - 2 Score 0 0 0  Altered sleeping 1 1 2   Tired, decreased energy 1 1 2   Change in appetite 0 1 0  Feeling bad or failure about yourself  0 0 0  Trouble concentrating 0 0 0  Moving slowly or fidgety/restless 0 1 0  Suicidal thoughts 0 0 0  PHQ-9 Score 2 4 4   Difficult doing work/chores Not difficult at all Not difficult at all Not difficult at all      12/09/2023    5:02 PM 10/28/2023   12:16 PM 09/05/2023   12:59 PM 11/16/2022    9:16 AM  GAD 7 : Generalized Anxiety Score  Nervous, Anxious, on Edge 0 0 0 0  Control/stop worrying 0 0 0 0  Worry too much - different things 0 0 2 0  Trouble relaxing 0 0 0 1  Restless 0 0 0 0  Easily annoyed or irritable 0 0 0 0  Afraid - awful might happen 0 1 0 1  Total GAD 7 Score 0 1 2 2   Anxiety Difficulty Not difficult at all Not difficult at all Not difficult at all Not difficult at all     No results found for any visits on 01/02/24.    Assessment & Plan:   Chest pain, unspecified type -     EKG 12-Lead  Left elbow pain -     DG ELBOW COMPLETE LEFT (3+VIEW); Future -     Ambulatory referral to Orthopedic Surgery  Essential  hypertension -     EKG 12-Lead  Alcohol use - Reports drinking 3 beers per day  Paroxysmal A-fib (HCC)  Dizziness  Acute CP x 3 days.  EKG in office abnormal.  A-fib, RR' waves in V1 and 2, T wave inversion in V2.  Given history of CAD and current symptoms discussed proceeding to ED for further evaluation.  Patient initially declines.  Discussed potential r/b/a.  Patient reluctantly agrees but declines EMS transport.    Left elbow with continued edema and pain status post fall/13/25.  X-ray left elbow limited.  Degenerative changes noted and joint effusion. Concern for possible subtle fracture in the setting of trauma.  Additional imaging recommended.  Discussed immobilization with sling and additional imaging.  Patient initially hesitant.  Further evaluation in ED.  Ortho referral placed.  No follow-ups on file.   Viola Greulich, MD

## 2024-01-02 NOTE — ED Provider Notes (Signed)
 [place] EMERGENCY DEPARTMENT AT [facility] Provider Note   CSN: [id number] Arrival date & time: 01/02/24  1122     History  Chief Complaint  Patient presents with   Chest Pain    [name] [name] is a 68 y.o. male.   Chest Pain Patient is a 68 year old male presents ED with complaints of 3-day history of chest pain.  Seen by family medicine today, noted to have atrial fibrillation at that time and was told to come to the ED for further evaluation.  Previous medical history of alcohol abuse, hep B, paroxysmal atrial fibrillation mitral regurg, CHF, CAD, type 2 diabetes, HIV.  Reports that he is currently not taking his Eliquis . Notes that he has no exertional symptoms and that the pain has been intermittent.  Currently pain is subsided.  States that he only notices the chest pain when his left elbow pain from fall starts to hurt more.  Seen on 11/24/2023 where he noted that he had fallen and hit his left eye and left elbow on the ground while intoxicated. Notes that he is still drinking 3 to 4, 12 ounce cans of beers daily.  Fever, worsening vision changes, cough, or shortness of breath, nausea, vomiting, hallucinations, tremors, abdominal pain, diarrhea, dysuria, lower leg swelling.      Home Medications Prior to Admission medications   Medication Sig Start Date End Date Taking? Authorizing Provider  sotalol  (BETAPACE ) 120 MG tablet Take 120 mg by mouth 2 (two) times daily. 12/11/23  Yes [provider]  ACCU-CHEK SOFTCLIX LANCETS lancets Use as instructed for three times daily testing of blood glucose 09/06/16   Jegede, Olugbemiga E, MD  acetaminophen  (TYLENOL ) 500 MG tablet Take 1,000 mg by mouth every 6 (six) hours as needed (pain).    [provider]  albuterol  (VENTOLIN  HFA) 108 (90 Base) MCG/ACT inhaler Inhale 1-2 puffs into the lungs every 6 (six) hours as needed for wheezing or shortness of breath. 04/06/20   Wilhelmenia Harada, Amy V, PA-C  amiodarone   (PACERONE ) 200 MG tablet Take 1 tablet (200 mg total) by mouth 2 (two) times daily for 10 days, THEN 1 tablet (200 mg total) daily. 09/10/23 09/08/24  Darlis Eisenmenger, MD  atorvastatin  (LIPITOR ) 80 MG tablet TAKE 1 TABLET BY MOUTH ONCE DAILY 04/30/23   Bensimhon, Rheta Celestine, MD  blood glucose meter kit and supplies KIT Dispense based on patient and insurance preference. Use up to four times daily as directed. (FOR ICD-9 250.00, 250.01). 07/20/20   Viola Greulich, MD  Blood Glucose Monitoring Suppl (ACCU-CHEK AVIVA PLUS) w/Device KIT Use as directed for 3 times daily testing of blood glucose 09/06/16   Jegede, Olugbemiga E, MD  carvedilol  (COREG ) 12.5 MG tablet TAKE 1 TABLET BY MOUTH TWICE DAILY WITH MEALS *PATIENT NEEDS APPOINTMENT* 12/13/23   Darlis Eisenmenger, MD  colchicine  0.6 MG tablet On day 1 take 2 tablets at first sign of gout flare.  Take 1 tablet 1 hour later.  Then take 1 tablet daily starting the next day until resolution of gout flare. 05/24/21   Elmarie Hacking, FNP  Continuous Blood Gluc Receiver (FREESTYLE LIBRE 2 READER) DEVI Check your blood sugar three times per day before meals and once before bedtime 02/19/22   Viola Greulich, MD  Continuous Glucose Receiver (FREESTYLE LIBRE 3 READER) DEVI 1 Device by Does not apply route continuous. 09/05/23   Viola Greulich, MD  Continuous Glucose Sensor (FREESTYLE LIBRE 3 SENSOR) MISC 1 Device by  Does not apply route every 14 (fourteen) days. 10/28/23   Viola Greulich, MD  ELIQUIS  5 MG TABS tablet TAKE 1 TABLET BY MOUTH TWICE DAILY 12/13/23   Darlis Eisenmenger, MD  eplerenone  (INSPRA ) 25 MG tablet Take 1 tablet (25 mg total) by mouth daily. 09/25/23   Elmarie Hacking, FNP  ezetimibe  (ZETIA ) 10 MG tablet TAKE 1 TABLET BY MOUTH ONCE DAILY 10/16/23   McLean, Dalton S, MD  glucose blood (ACCU-CHEK AVIVA PLUS) test strip Use as instructed for 3 times daily testing of blood glucose 09/06/16   Jegede, Olugbemiga E, MD  hydrALAZINE  (APRESOLINE ) 50 MG tablet  TAKE 1 TABLET BY MOUTH TWICE DAILY IN THE MORNING AND AT BEDTIME 12/27/23   McLean, Dalton S, MD  insulin  glargine (LANTUS ) 100 UNIT/ML injection Inject 0.35 mLs (35 Units total) into the skin at bedtime. 10/28/23   Viola Greulich, MD  insulin  lispro (HUMALOG ) 100 UNIT/ML injection Inject 1 Units into the skin 3 (three) times daily with meals. Sliding scale 70-140=0 Units 141-170=1 Units 171-200=2 Units 201-230=3 Units 231-260=4 Units 261-290=5 Units 291-320=6 Units 231-350=7 Units 351-380=8 Units Over 380 10 units    [provider]  Insulin  Pen Needle (PEN NEEDLES) 32G X 4 MM MISC 1 each by Does not apply route daily. 06/08/22   Lajean Pike, MD  isosorbide  mononitrate (IMDUR ) 120 MG 24 hr tablet TAKE 1 TABLET BY MOUTH ONCE DAILY 12/13/23   McLean, Dalton S, MD  JARDIANCE  25 MG TABS tablet TAKE 1 TABLET BY MOUTH ONCE DAILY CONTACT OFFICE FOR APPOINTMENT 09/16/23   Jorge Newcomer, MD  Lancet Devices [facility]) lancets Use as instructed for 3 times daily testing of blood glucose 09/06/16   Jegede, Olugbemiga E, MD  losartan  (COZAAR ) 25 MG tablet Take 1 tablet (25 mg total) by mouth daily. 09/05/23 09/04/24  Viola Greulich, MD  metFORMIN  (GLUCOPHAGE ) 1000 MG tablet TAKE 1 TABLET BY MOUTH ONCE DAILY WITH BREAKFAST *PATIENT NEEDS APPOINTMENT FOR FURTHER REFILLS* 06/14/23   Motwani, Joanna Muck, MD  nitroGLYCERIN  (NITROSTAT ) 0.4 MG SL tablet DISSOLVE 1 TABLET UNDER THE TONGUE AS NEEDED FOR CHEST PAIN EVERY 5 MINUTES UP TO 3 TIMES. IF NO RELIEF CALL 911. 07/16/23   Bensimhon, Rheta Celestine, MD  oxyCODONE -acetaminophen  (PERCOCET/ROXICET) 5-325 MG tablet Take 1 tablet by mouth every 6 (six) hours as needed for severe pain (pain score 7-10). 11/24/23   Auston Blush, MD  potassium chloride  SA (KLOR-CON  M) 20 MEQ tablet TAKE 1 TABLET BY MOUTH ONCE DAILY 04/30/23   Bensimhon, Rheta Celestine, MD  torsemide  (DEMADEX ) 20 MG tablet TAKE 2 TABLETS BY MOUTH TWICE DAILY 12/27/23   McLean, Dalton S, MD  TRUEPLUS LANCETS  28G MISC 1 each by Does not apply route 3 (three) times daily. 08/22/16   Jegede, Olugbemiga E, MD      Allergies    Shrimp [shellfish allergy], Ace inhibitors, and Other    Review of Systems   Review of Systems  Cardiovascular:  Positive for chest pain.  All other systems reviewed and are negative.   Physical Exam Updated Vital Signs BP (!) 181/107   Pulse (!) 58   Temp 98.7 F (37.1 C) (Oral)   Resp 17   SpO2 96%  Physical Exam Vitals and nursing note reviewed.  Constitutional:      General: He is not in acute distress.    Appearance: Normal appearance. He is not ill-appearing or diaphoretic.  HENT:     Head: Normocephalic and atraumatic.  Eyes:  General:        Right eye: No discharge.        Left eye: No discharge.     Extraocular Movements: Extraocular movements intact.     Conjunctiva/sclera: Conjunctivae normal.  Cardiovascular:     Rate and Rhythm: Normal rate. Rhythm irregular.     Pulses: Normal pulses.     Heart sounds: Normal heart sounds. No murmur heard.    No friction rub. No gallop.  Pulmonary:     Effort: Pulmonary effort is normal. No respiratory distress.     Breath sounds: Normal breath sounds. No stridor. No wheezing, rhonchi or rales.  Abdominal:     General: Abdomen is flat. There is no distension.     Palpations: Abdomen is soft.     Tenderness: There is no abdominal tenderness. There is no right CVA tenderness, left CVA tenderness or guarding.  Musculoskeletal:        General: Swelling (Left swollen elbow per fall previously) and tenderness present.     Cervical back: Normal range of motion. No rigidity.     Right lower leg: No edema.     Left lower leg: No edema.  Skin:    General: Skin is warm and dry.     Findings: No bruising, erythema or lesion.  Neurological:     General: No focal deficit present.     Mental Status: He is alert and oriented to person, place, and time. Mental status is at baseline.     Sensory: No sensory  deficit.     Motor: No weakness.     Coordination: Coordination normal.     Gait: Gait normal.  Psychiatric:        Mood and Affect: Mood normal.     ED Results / Procedures / Treatments   Labs (all labs ordered are listed, but only abnormal results are displayed) Labs Reviewed  BASIC METABOLIC PANEL WITH GFR - Abnormal; Notable for the following components:      Result Value   Glucose, Bld 289 (*)    Creatinine, Ser 1.46 (*)    GFR, Estimated 52 (*)    All other components within normal limits  CBC - Abnormal; Notable for the following components:   RBC 6.19 (*)    RDW 19.5 (*)    Platelets 146 (*)    All other components within normal limits  TROPONIN T, HIGH SENSITIVITY - Abnormal; Notable for the following components:   Troponin T High Sensitivity 23 (*)    All other components within normal limits  TROPONIN T, HIGH SENSITIVITY - Abnormal; Notable for the following components:   Troponin T High Sensitivity 22 (*)    All other components within normal limits    EKG EKG Interpretation Date/Time:  Thursday Jan 02 2024 11:50:34 EDT Ventricular Rate:  77 PR Interval:    QRS Duration:  98 QT Interval:  408 QTC Calculation: 462 R Axis:   68  Text Interpretation: Atrial fibrillation Probable LVH with secondary repol abnrm ST depr, consider ischemia, inferior leads inferior and lateral TWI more pronounced Confirmed by Deatra Face [phone]) on 01/02/2024 12:21:06 PM  Radiology DG Chest 2 View Result Date: 01/02/2024 CLINICAL DATA:  CP EXAM: CHEST - 2 VIEW COMPARISON:  November 24, 2023 FINDINGS: No focal airspace consolidation, pleural effusion, or pneumothorax. Mild cardiomegaly. Tortuous aorta with aortic atherosclerosis. Implantable, wireless pulmonary artery pressure monitoring device in the left infrahilar region (CardioMEMS). No acute fracture or destructive lesion. Multilevel degenerative disc disease  of the spine. IMPRESSION: No acute cardiopulmonary abnormality.  Electronically Signed   By: Rance Burrows M.D.   On: 01/02/2024 15:11   DG Elbow Complete Left Result Date: 01/02/2024 CLINICAL DATA:  Continued left elbow pain after fall 3 weeks ago EXAM: LEFT ELBOW - COMPLETE 3 VIEW COMPARISON:  None Available. FINDINGS: There is a joint effusion on lateral view. Mild degenerative changes seen of the elbow joint with some small osteophytes and sclerosis. Preserved bone mineralization. Of note there is no true AP film limiting evaluation. IMPRESSION: Degenerative changes. Joint effusion identified. Limited x-rays. With the presence of a joint effusion in the setting of trauma a subtle fracture is not excluded. Recommend additional imaging. Electronically Signed   By: Adrianna Horde M.D.   On: 01/02/2024 10:28    Procedures Procedures    Medications Ordered in ED Medications  hydrALAZINE  (APRESOLINE ) tablet 50 mg (50 mg Oral Given 01/02/24 1527)    ED Course/ Medical Decision Making/ A&P                                 Medical Decision Making Amount and/or Complexity of Data Reviewed Labs: ordered. Radiology: ordered.   This patient is a  68 year old male who presents to the ED for concern of chest pain intermittent x 3 days, only noticed when left elbow hurts, from a fall previously.  Not anticoagulated.  Seen by PCP today and told to come to ED due to being seen in A-fib with chest pain.  On physical exam, patient is in no acute distress, afebrile, alert and orient x 4, speaking in full sentences, nontachypneic, nontachycardic.  Resting comfortably and smiling, no tremors noted.  Patient resting well.  Mildly swollen left elbow noted without signs of erythema, increased warmth.  LCTAB, no murmurs, no abdominal pain, no lower leg swelling.  Unremarkable exam otherwise.  With patient not in any acute distress and not anticoagulated, will obtain baseline labs to ensure no ACS.  Notably in A-fib on EKG however with him not being anticoagulated and rate  controlled currently, it is not warranted that he be cardioverted.  Case discussed with attending Dr. Nanivati, who agreed with plan.  Labs were unremarkable with second troponin decreased from previous.  Will have him continue to follow-up with PCP and be reevaluated.  He was given hydralazine  due to having an elevated blood pressure still asymptomatic at this time.  Blood pressure under evaluation had decreased.  Patient vital signs have remained stable throughout the course of patient's time in the ED. Low suspicion for any other emergent pathology at this time. I believe this patient is safe to be discharged. Provided strict return to ER precautions. Patient expressed agreement and understanding of plan. All questions were answered.  Differential diagnoses prior to evaluation: The emergent differential diagnosis includes, but is not limited to, arrhythmia, ACS, PE, AAS, pneumonia, bronchitis, muscle strain. This is not an exhaustive differential.   Past Medical History / Co-morbidities / Social History: HTN, mitral regurgitation, CHF, alcohol abuse, osteochondrosarcoma, PAF, CAD, HLD, type 2 diabetes, HIV  Additional history: Chart reviewed. Pertinent results include:  Seen by pain medicine today noting to have had chest pain x 3 days with abnormal EKG in office showing A-fib.  Sent to the ED.  Last echo done on 10/02/2023 noting EF of 50 to 55% with LV function being low normal.  Right atrium was mildly dilated.  Seen in the  ED on 11/24/2023 after a fall while intoxicated, noted to have an elevated troponin at that time of 31.  Lab Tests/Imaging studies: I personally interpreted labs/imaging and the pertinent results include:    CBC baseline BMP shows elevated noted to be noted on, close baseline Troponin 23, delta troponin 22 Chest x-ray unremarkable  I agree with the radiologist interpretation.  Cardiac monitoring: EKG obtained and interpreted by myself and attending physician which  shows: af  EKG Interpretation Date/Time:  Thursday Jan 02 2024 11:50:34 EDT Ventricular Rate:  77 PR Interval:    QRS Duration:  98 QT Interval:  408 QTC Calculation: 462 R Axis:   68  Text Interpretation: Atrial fibrillation Probable LVH with secondary repol abnrm ST depr, consider ischemia, inferior leads inferior and lateral TWI more pronounced Confirmed by Deatra Face [phone]) on 01/02/2024 12:21:06 PM          Medications: I ordered medication including hydralazine .  I have reviewed the patients home medicines and have made adjustments as needed.   Disposition: After consideration of the diagnostic results and the patients response to treatment, I feel that the patient would benefit from discharge and treatment as above.   emergency department workup does not suggest an emergent condition requiring admission or immediate intervention beyond what has been performed at this time. The plan is: Follow-up PCP, take complications as prescribed, be faithful with taking Eliquis , return for new or worsening symptoms. The patient is safe for discharge and has been instructed to return immediately for worsening symptoms, change in symptoms or any other concerns.   Final Clinical Impression(s) / ED Diagnoses Final diagnoses:  Chest pain, unspecified type    Rx / DC Orders ED Discharge Orders     None         Vevelyn Gowers 01/02/24 1602    Deatra Face, MD 01/03/24 1431

## 2024-01-02 NOTE — Discharge Instructions (Signed)
 You were seen today for chest pain.  Your labs and imaging today were reassuring that have low suspicion for any emergent cause of your symptoms today.  Recommend you continue to be faithful with your Eliquis  as well as follow-up with PCP for further management evaluation.  Return to the ED for any new or worsening symptoms including worsening chest pain with shortness of breath, weakness, numbness, tingling, confusion, fever.

## 2024-01-02 NOTE — ED Notes (Signed)
 Patient checked into ED and then went back to his car for several minutes. Patient came back in several minutes later and we started treatment as soon as he came back into the building. Patient states he did not take BP meds this morning.

## 2024-01-02 NOTE — ED Triage Notes (Signed)
 Pt c/o LT side, non radiating CP (3/10) x 3 days and RT elbow pain (7/10 x 1 week. Swelling noted to elbow

## 2024-01-23 ENCOUNTER — Encounter: Payer: Self-pay | Admitting: Family Medicine

## 2024-01-23 ENCOUNTER — Ambulatory Visit (INDEPENDENT_AMBULATORY_CARE_PROVIDER_SITE_OTHER): Admitting: Family Medicine

## 2024-01-23 ENCOUNTER — Ambulatory Visit: Payer: Self-pay

## 2024-01-23 VITALS — BP 140/86 | HR 64 | Temp 97.8°F | Ht 71.0 in | Wt 198.2 lb

## 2024-01-23 DIAGNOSIS — I5032 Chronic diastolic (congestive) heart failure: Secondary | ICD-10-CM

## 2024-01-23 DIAGNOSIS — I480 Paroxysmal atrial fibrillation: Secondary | ICD-10-CM | POA: Diagnosis not present

## 2024-01-23 DIAGNOSIS — Z794 Long term (current) use of insulin: Secondary | ICD-10-CM | POA: Diagnosis not present

## 2024-01-23 DIAGNOSIS — E1165 Type 2 diabetes mellitus with hyperglycemia: Secondary | ICD-10-CM

## 2024-01-23 DIAGNOSIS — R600 Localized edema: Secondary | ICD-10-CM | POA: Diagnosis not present

## 2024-01-23 DIAGNOSIS — B070 Plantar wart: Secondary | ICD-10-CM | POA: Diagnosis not present

## 2024-01-23 DIAGNOSIS — M10072 Idiopathic gout, left ankle and foot: Secondary | ICD-10-CM

## 2024-01-23 MED ORDER — BLOOD GLUCOSE TEST VI STRP
1.0000 | ORAL_STRIP | Freq: Three times a day (TID) | 11 refills | Status: AC
Start: 2024-01-23 — End: ?

## 2024-01-23 MED ORDER — LANCET DEVICE MISC
1.0000 | Freq: Three times a day (TID) | 0 refills | Status: AC
Start: 2024-01-23 — End: 2024-02-22

## 2024-01-23 MED ORDER — LANCETS MISC. MISC: 1.0000 | Freq: Three times a day (TID) | 11 refills | Status: AC

## 2024-01-23 MED ORDER — BLOOD GLUCOSE MONITORING SUPPL DEVI: 1.0000 | Freq: Three times a day (TID) | 0 refills | Status: AC

## 2024-01-23 MED ORDER — PREDNISONE 20 MG PO TABS: 20.0000 mg | ORAL_TABLET | Freq: Every day | ORAL | 0 refills | Status: DC

## 2024-01-23 NOTE — Progress Notes (Signed)
 Established Patient Office Visit   Subjective  Patient ID: [name] [name], male    DOB: 01-09-1956  Age: 68 y.o. MRN: [id number]  Chief Complaint  Patient presents with   Medical Management of Chronic Issues    Patient came in today for Bilateral Diabetic neuropathy, tingling and numbness and swelling, patient check his BG and it said high, today in the office BG-310   Patient's daughter on phone during visit.  Patient is a 68 year old male seen for acute concerns/follow-up.  Patient endorses paresthesias in bilateral feet x 1 week.  Patient also notes left foot has been swollen and painful x 2 days.  Was unable to apply pressure to the foot due to discomfort.  Patient eating fast food-fried chicken, roast beef sandwich, fish sandwich, and liver days prior to foot pain.  Patient continuing to drink alcohol.  Patient states blood sugar high per GCM.  Patient did not check blood sugar manually as he lost his meter.  Blood sugar this a.m. 310.  Patient worried, states he does not want to lose the foot.  States was not taking Lantus  as he ran out of syringes.  Was able to get some from pharmacy this week.    Patient Active Problem List   Diagnosis Date Noted   Wrist fracture, left, closed, initial encounter 06/05/2022   Rib pain 06/05/2022   Mobitz (type) I [facility]) atrioventricular block 01/23/2022   Chest pain of uncertain etiology 01/22/2022   Syncope 02/09/2020   Generalized weakness 06/27/2019   Acute on chronic congestive heart failure with left ventricular diastolic dysfunction (HCC) 06/26/2019   Anemia due to chronic kidney disease 06/26/2019   CKD (chronic kidney disease), stage III (HCC) 06/26/2019   Type 2 diabetes mellitus without complication (HCC) 06/26/2019   Iron deficiency anemia due to chronic blood loss 06/26/2019   History of atrial fibrillation    History of type 2 diabetes mellitus    Anemia    Chest pain 04/24/2019   Overweight (BMI 25.0-29.9)  02/28/2017   Unstable angina (HCC) 08/03/2016   S/P laparoscopic cholecystectomy 02/07/2016   Paroxysmal A-fib (HCC) 01/22/2016   Coronary artery disease involving native coronary artery with unstable angina pectoris (HCC) 12/29/2015   Diabetes (HCC) 11/12/2015   Chronic anticoagulation 08/22/2015   Atrial flutter with controlled response (HCC) 08/22/2015   RUQ pain    Cholelithiases- drain in place 06/04/2015   Essential hypertension    Chronic diastolic CHF (congestive heart failure) (HCC) 04/24/2015   Excessive daytime sleepiness 11/25/2014   Obstructive sleep apnea 11/25/2014   NICM (nonischemic cardiomyopathy) (HCC) 03/11/2014   Anticoagulation goal of INR 2 to 3 10/19/2013   Eczema 10/19/2013   Low libido 10/16/2013   Persistent atrial fibrillation (HCC) 10/06/2013   Rectal bleeding 12/18/2011   HYPERCHOLESTEROLEMIA 07/11/2010   DENTAL CARIES 06/30/2010   GOUT 05/05/2009   HEPATITIS B, CHRONIC 12/30/2006   OSTEOCHONDROMA 12/30/2006   Mitral valve disorder 12/30/2006   LIVER FUNCTION TESTS, ABNORMAL, HX OF 12/30/2006   History of colonic polyps 12/30/2006   COLONOSCOPY, HX OF 05/12/2001   Past Medical History:  Diagnosis Date   Anxiety    CAD (coronary artery disease), native coronary artery    a. Nonobstructive CAD by cath 2013 - diffuse distal and branch vessel CAD, no severe disease in the major coronaries, LV mild global hypokinesis, EF 45%. b. ETT-Sestamibi 5/14: EF 31%, small fixed inferior defect with no ischemia.   Chronic CHF (HCC)    a. Mixed ICM/NICM (?  EtOH). EF 35% in 2008. Echo 5/13: EF 60-65%, mod LVH, EF 45% on V gram in 12/2011. EF 12/2012: EF 50-55%, mild LVH, inferobasal HK, mild MR. ETT-Ses 5/14 EF 41%. Cardiac MRI 5/14: EF 44%, mild global HK, subepicardial delayed enhancement in nonspecific RV insertion pattern.   COLONIC POLYPS, HX OF 12/30/2006   Gout    H/O atrial flutter 08/13/2005   a. Ablations in 2007, 2008.   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   left shoulder   PAF (paroxysmal atrial fibrillation) (HCC)    On coumadin    Sleep apnea    suppose to send mask but they never did (05/03/2015)   Type II diabetes mellitus (HCC)    Past Surgical History:  Procedure Laterality Date   A FLUTTER ABLATION  2007, 2008   catheter ablation    CARDIAC CATHETERIZATION N/A 05/03/2015   Procedure: Right/Left Heart Cath and Coronary Angiography;  Surgeon: Darlis Eisenmenger, MD;  Location: [facility] INVASIVE CV LAB;  Service: Cardiovascular;  Laterality: N/A;   CARDIAC CATHETERIZATION N/A 05/03/2015   Procedure: Coronary Stent Intervention;  Surgeon: Lucendia Rusk, MD;  Location: [facility] INVASIVE CV LAB;  Service: Cardiovascular;  Laterality: N/A;   CARDIAC CATHETERIZATION N/A 11/04/2015   Procedure: Left Heart Cath and Coronary Angiography;  Surgeon: Darlis Eisenmenger, MD;  Location: [facility] INVASIVE CV LAB;  Service: Cardiovascular;  Laterality: N/A;   CARDIOVERSION N/A 08/24/2015   Procedure: CARDIOVERSION;  Surgeon: Lake Pilgrim, MD;  Location: [facility] ENDOSCOPY;  Service: Cardiovascular;  Laterality: N/A;   CARDIOVERSION N/A 12/14/2022   Procedure: CARDIOVERSION;  Surgeon: Darlis Eisenmenger, MD;  Location: [facility] INVASIVE CV LAB;  Service: Cardiovascular;  Laterality: N/A;   CARDIOVERSION N/A 10/02/2023   Procedure: CARDIOVERSION;  Surgeon: Darlis Eisenmenger, MD;  Location: [facility] INVASIVE CV LAB;  Service: Cardiovascular;  Laterality: N/A;   CHOLECYSTECTOMY N/A 02/07/2016   Procedure: LAPAROSCOPIC CHOLECYSTECTOMY WITH  INTRAOPERATIVE CHOLANGIOGRAM;  Surgeon: Aldean Hummingbird, MD;  Location: [facility] ORS;  Service: General;  Laterality:  N/A;   COLONOSCOPY WITH PROPOFOL  N/A 04/28/2019   Procedure: COLONOSCOPY WITH PROPOFOL ;  Surgeon: Genell Ken, MD;  Location: [facility] ENDOSCOPY;  Service: Gastroenterology;  Laterality: N/A;   CORONARY ANGIOPLASTY  12/05/01   ESOPHAGOGASTRODUODENOSCOPY (EGD) WITH PROPOFOL  N/A 04/27/2019   Procedure: ESOPHAGOGASTRODUODENOSCOPY (EGD) WITH PROPOFOL ;  Surgeon: Genell Ken, MD;  Location: [facility] ENDOSCOPY;  Service: Gastroenterology;  Laterality: N/A;   GIVENS CAPSULE STUDY N/A 06/29/2019   Procedure: GIVENS CAPSULE STUDY;  Surgeon: Baldo Bonds, MD;  Location: [facility] ENDOSCOPY;  Service: Endoscopy;  Laterality: N/A;   LEFT HEART CATH AND CORONARY ANGIOGRAPHY N/A 12/28/2016   Procedure: Left Heart Cath and Coronary Angiography;  Surgeon: Darlis Eisenmenger, MD;  Location: [facility] INVASIVE CV LAB;  Service: Cardiovascular;  Laterality: N/A;   LEFT HEART CATHETERIZATION WITH CORONARY ANGIOGRAM N/A 12/19/2011   Procedure: LEFT HEART CATHETERIZATION WITH CORONARY ANGIOGRAM;  Surgeon: Darlis Eisenmenger, MD;  Location: [facility] CATH LAB;  Service: Cardiovascular;  Laterality: N/A;   OSTEOCHONDROMA EXCISION Left 1972   took bone tumor off my shoulder   POLYPECTOMY  04/28/2019   Procedure:  POLYPECTOMY;  Surgeon: Genell Ken, MD;  Location: [facility] ENDOSCOPY;  Service: Gastroenterology;;   PRESSURE SENSOR/CARDIOMEMS N/A 02/01/2020   Procedure: PRESSURE SENSOR/CARDIOMEMS;  Surgeon: Darlis Eisenmenger, MD;  Location: [facility] INVASIVE CV LAB;  Service: Cardiovascular;  Laterality: N/A;   RIGHT HEART CATH N/A 02/01/2020   Procedure: RIGHT HEART CATH;  Surgeon: Darlis Eisenmenger, MD;  Location: [facility] INVASIVE CV LAB;  Service: Cardiovascular;  Laterality: N/A;   RIGHT/LEFT HEART CATH AND CORONARY ANGIOGRAPHY N/A 07/01/2019   Procedure: RIGHT/LEFT HEART CATH AND CORONARY ANGIOGRAPHY;  Surgeon: Darlis Eisenmenger, MD;  Location: [facility] INVASIVE CV LAB;  Service: Cardiovascular;  Laterality: N/A;   TEE WITHOUT CARDIOVERSION N/A 12/14/2022   Procedure: TRANSESOPHAGEAL  ECHOCARDIOGRAM;  Surgeon: Darlis Eisenmenger, MD;  Location: [facility] INVASIVE CV LAB;  Service: Cardiovascular;  Laterality: N/A;   TRANSESOPHAGEAL ECHOCARDIOGRAM (CATH LAB) N/A 10/02/2023   Procedure: TRANSESOPHAGEAL ECHOCARDIOGRAM;  Surgeon: Darlis Eisenmenger, MD;  Location: [facility] INVASIVE CV LAB;  Service: Cardiovascular;  Laterality: N/A;   Social History   Tobacco Use   Smoking status: Former    Current packs/day: 0.00    Average packs/day: 4.0 packs/day for 18.0 years (72.0 ttl pk-yrs)    Types: Cigarettes    Start date: 08/13/1965    Quit date: 08/14/1983    Years since quitting: 40.4   Smokeless tobacco: Never  Vaping Use   Vaping status: Never Used  Substance Use Topics   Alcohol use: Yes    Alcohol/week: 16.0 standard drinks of alcohol    Types: 6 Cans of beer, 10 Shots of liquor per week    Comment: 2-3 beers watching sports; occasional glass of liquor; drinks heavily in football season   Drug use: Yes    Types: Marijuana    Comment: twice a week   Family History  Problem Relation Age of Onset   Diabetes Mother    Hypertension Mother    Heart attack Neg Hx    Stroke Neg Hx    Allergies  Allergen Reactions   Shrimp [Shellfish Allergy] Nausea And Vomiting   Ace Inhibitors Cough   Other Hives and Other (See Comments)    Patient reports developing hives after receiving some antibiotic given in 1980''s at [facility]. He does not know which antibiotic.    ROS Negative unless stated above    Objective:     BP (!) 140/86 (BP Location: Left Arm, Patient Position: Sitting, Cuff Size: Normal)   Pulse 64   Temp 97.8 F (36.6 C) (Oral)   Ht 5' 11 (1.803 m)   Wt 198 lb 3.2 oz (89.9 kg)   SpO2 97%   BMI 27.64 kg/m  BP Readings from Last 3 Encounters:  01/23/24 (!) 140/86  01/02/24 (!) 181/107  01/02/24 (!) 164/82   Wt Readings from Last 3 Encounters:  01/23/24 198 lb 3.2 oz (89.9 kg)  01/02/24 197 lb 3.2 oz (89.4 kg)  12/09/23 204 lb 3.2 oz (92.6 kg)       Physical Exam Constitutional:      General: He is not in acute distress.    Appearance: Normal appearance.  HENT:     Head: Normocephalic and atraumatic.     Nose: Nose normal.     Mouth/Throat:     Mouth: Mucous membranes are moist.   Cardiovascular:     Rate and Rhythm: Normal rate and regular rhythm.     Heart sounds: Normal heart sounds. No murmur heard.    No gallop.  Pulmonary:     Effort: Pulmonary effort is normal. No respiratory distress.     Breath sounds: Normal breath sounds. No wheezing, rhonchi or rales.   Musculoskeletal:     Left ankle: Tenderness present over the lateral malleolus.     Right foot: Normal.     Left foot: Swelling and tenderness present.       Legs:     Comments: Crepitus in left ankle.   Skin:    General: Skin is warm and dry.     Comments: Plantar wart of right foot.  Wart of right lateral lower leg and left medial ankle.   Neurological:     Mental Status: He is alert and oriented to person, place, and time.        01/02/2024    9:54 AM 12/09/2023    5:02 PM 10/28/2023   12:16 PM  Depression screen PHQ 2/9  Decreased Interest 0 0 0  Down, Depressed, Hopeless 0 0 0  PHQ - 2 Score 0 0 0  Altered sleeping 1 1 1   Tired, decreased energy 1 1 1   Change in appetite 2 0 1  Feeling bad or failure about yourself  1 0 0  Trouble concentrating 1 0 0  Moving slowly or fidgety/restless 0 0 1  Suicidal thoughts 0 0 0  PHQ-9 Score 6 2 4   Difficult doing work/chores Not difficult at all Not difficult at all Not difficult at all      01/02/2024    9:54 AM 12/09/2023    5:02 PM 10/28/2023   12:16 PM 09/05/2023   12:59 PM  GAD 7 : Generalized Anxiety Score  Nervous, Anxious, on Edge 1 0 0 0  Control/stop worrying 0 0 0 0  Worry too much - different things 1 0 0 2  Trouble relaxing 0 0 0 0  Restless 0 0 0 0  Easily annoyed or irritable 0 0 0 0  Afraid - awful might happen 1 0 1 0  Total GAD 7 Score 3 0 1 2  Anxiety Difficulty Not  difficult at all Not difficult at all Not difficult at all Not difficult at all     No results found for any visits on 01/23/24.    Assessment & Plan:   Type 2 diabetes mellitus with hyperglycemia, with long-term current use of insulin  (HCC) -     Blood Glucose Monitoring Suppl; 1 each by Does not apply route in the morning, at noon, and at bedtime. May substitute to any manufacturer covered by patient's insurance.  Dispense: 1 each; Refill: 0 -     Blood Glucose Test; 1 each by In Vitro route in the morning, at noon, and at bedtime. May substitute to any manufacturer covered by patient's insurance.  Dispense: 100 strip; Refill: 11 -     Lancet Device; 1 each by Does not apply route in the morning, at noon, and at bedtime. May substitute to any manufacturer covered by patient's insurance.  Dispense: 1 each; Refill: 0 -     Lancets Misc.; 1 each by Does not apply route in the morning, at noon, and at bedtime. May substitute to any manufacturer covered by patient's insurance.  Dispense: 100 each; Refill: 11  Acute idiopathic gout of left ankle  Plantar wart  Chronic diastolic CHF (congestive heart failure) (HCC)  Paroxysmal A-fib (HCC)  DM uncontrolled.  Hemoglobin A1c 9.8% on 10/28/2023.  Blood sugar this morning 310.  Patient advised on the importance of lifestyle  modifications and medication compliance.  Continue sliding scale Humalog : BS 70-140= 0 units, 141-170 equals 1 unit, 171-200=2 u, 201-230=3 u, etc. continue Lantus  30 units nightly.  Left foot edema and pain likely 2/2 gout flare.   Discussed the importance of diet changes and decreasing EtOH intake.  Discussed r/b/a of various treatment options.  Unable to take colchicine  due to heart medications.  Would also advised against prednisone  given already uncontrolled blood sugar.  Advised NSAIDs can elevate BP and affect kidneys.  Patient elects to hydrate and use ice on foot, if symptoms become worse will consider a few days of  prednisone .  Currently euvolemic and A-fib controlled.  Continue current heart medications including amiodarone  200 mg daily, Coreg  12.5 mg twice daily, hydralazine  50 mg twice daily, Imdur  120 mg daily, losartan  25 mg daily, torsemide  40 mg twice daily, and eplerenone  25 mg daily.  Sotalol  listed on med list however does not appear patient is taking.  Also continue Lipitor  80 mg daily.  Continue Eliquis  5 mg twice daily.  Patient encouraged to set up follow-up with cardiology.  Return in about 6 weeks (around 03/05/2024).   Viola Greulich, MD

## 2024-01-23 NOTE — Telephone Encounter (Signed)
 FYI Only or Action Required?: FYI only for provider  Patient was last seen in primary care on 01/02/2024 by Viola Greulich, MD. Called Nurse Triage reporting Neurologic Problem. Symptoms began several days ago. Interventions attempted: Nothing. Symptoms are: gradually worsening.  Triage Disposition: See PCP When Office is Open (Within 3 Days)  Patient/caregiver understands and will follow disposition?: Yes     Copied from CRM [id number]. Topic: Clinical - Red Word Triage >> Jan 23, 2024  8:02 AM Kita Perish H wrote: [facility] that prompted transfer to Nurse Triage: Tingling in right foot at bottom, left foot tingling and sore on bottom and side, left foot slightly swollen Reason for Disposition  [1] Numbness or tingling on both sides of body AND [2] is a new symptom present > 24 hours  Answer Assessment - Initial Assessment Questions 1. SYMPTOM: What is the main symptom you are concerned about? (e.g., weakness, numbness)     Right foot = tingling on bottom      Left foot = tingling and painful, left foot also swollen 2. ONSET: When did this start? (minutes, hours, days; while sleeping)     A few days 3. LAST NORMAL: When was the last time you (the patient) were normal (no symptoms)?     A few days ago 4. PATTERN Does this come and go, or has it been constant since it started?  Is it present now?     Constant 6. NEUROLOGIC SYMPTOMS: Have you had any of the following symptoms: headache, dizziness, vision loss, double vision, changes in speech, unsteady on your feet?     Chronic dizziness   Patient is a diabetic Denies tingling in hands States this tingling is new for him Patient states he fell a few weeks ago and his eye was completely shut, so he hit his head Patient was not open to full triaging, stating he didn't understand why we needed to ask all the questions. Appt made for today.  Protocols used: Neurologic Deficit-A-AH

## 2024-01-23 NOTE — Telephone Encounter (Signed)
Patient has appt. 6/12

## 2024-01-31 ENCOUNTER — Ambulatory Visit: Payer: Self-pay

## 2024-01-31 ENCOUNTER — Telehealth: Payer: Self-pay | Admitting: Family Medicine

## 2024-01-31 NOTE — Telephone Encounter (Signed)
 Duplicate request

## 2024-01-31 NOTE — Telephone Encounter (Signed)
 FYI Only or Action Required?: Action required by provider: medication refill request.  Pt is requesting refill of colchicine  for wrist pain.  Patient was last seen in primary care on 01/23/2024 by Viola Greulich, MD. Called Nurse Triage reporting Wrist Pain. Symptoms began several days ago. Interventions attempted: Nothing. Symptoms are: gradually worsening.  Pt believes Meryl Acosta is a gout flare.  Triage Disposition: See HCP Within 4 Hours (Or PCP Triage)  Patient/caregiver understands and will follow disposition?: No, refuses disposition              Copied from CRM [id number]. Topic: Clinical - Red Word Triage >> Jan 31, 2024  8:02 AM Allyne Areola wrote: Red Word that prompted transfer to Nurse Triage: Patient has been experiencing a gout flare up, he is in pain and experiencing some swelling for the last 3 days. Reason for Disposition  [1] SEVERE pain (e.g., excruciating, unable to use hand or wrist at all) AND [2] not improved after 2 hours of pain medicine  Answer Assessment - Initial Assessment Questions 1. ONSET: When did the swelling start? (e.g., minutes, hours, days, weeks)     2-3 days 2. LOCATION: What part of the wrist is swollen?  Are both wrists swollen or just one wrist?     Both -  right is worse 3. SEVERITY: How bad is the swelling?    * NONE: No joint swelling.   * SKIN ONLY: Localized; small area of puffy or swollen skin.   * BALL OR LUMP: There is a small firm ball or lump; size of a pea, marble, or grape.   * MILD: Joint looks or feels mildly swollen or puffy.   * MODERATE: Swollen; interferes with normal activities (e.g., work or school); decreased range of movement.   * SEVERE: Very swollen; can't move swollen joint at all; unable to hold a cup of water .     moderate 4. RECURRENT SYMPTOM: Have you had wrist swelling before? If Yes, ask: When was the last time? What happened that time?     Yes - gout 5. CAUSE: What do you think is causing the wrist  swelling? (e.g., arthritis, ganglion cyst, insect bite, recent injury)     gout 6. OTHER SYMPTOMS: Do you have any other symptoms? (e.g., fever, hand pain)     Pain and swelling  Protocols used: Wrist Swelling-A-AH

## 2024-01-31 NOTE — Telephone Encounter (Signed)
 Copied from CRM [id number]. Topic: Clinical - Medication Refill >> Jan 31, 2024  7:57 AM Allyne Areola wrote: Medication: colchicine   Has the patient contacted their pharmacy? Yes, no refill available asked patient to contact provider.   (Agent: If no, request that the patient contact the pharmacy for the refill. If patient does not wish to contact the pharmacy document the reason why and proceed with request.) (Agent: If yes, when and what did the pharmacy advise?)  This is the patient's preferred pharmacy:    [facility] [address] ([address] [place] - [address] [place] ([place]) [place] [address] Phone: [phone] Fax: [phone]    Is this the correct pharmacy for this prescription? Yes If no, delete pharmacy and type the correct one.   Has the prescription been filled recently? No  Is the patient out of the medication? Yes  Has the patient been seen for an appointment in the last year OR does the patient have an upcoming appointment? Yes  Can we respond through MyChart? Yes  Agent: Please be advised that Rx refills may take up to 3 business days. We ask that you follow-up with your pharmacy.

## 2024-01-31 NOTE — Telephone Encounter (Signed)
 Patient is calling back in regarding his medication he is needing the medication now he stated the medication was as needed for gout he is in need of it now he would like a call back  today

## 2024-01-31 NOTE — Telephone Encounter (Signed)
 No active rx. for colchicine  .

## 2024-02-10 NOTE — Telephone Encounter (Signed)
 As previously advised colchicine  not indicated due to interaction with heart medications.

## 2024-02-11 NOTE — Telephone Encounter (Signed)
 Called patient unable to leave VM,

## 2024-03-12 ENCOUNTER — Other Ambulatory Visit: Payer: Self-pay | Admitting: "Endocrinology

## 2024-03-12 DIAGNOSIS — E119 Type 2 diabetes mellitus without complications: Secondary | ICD-10-CM

## 2024-04-09 ENCOUNTER — Other Ambulatory Visit (HOSPITAL_COMMUNITY): Payer: Self-pay | Admitting: Internal Medicine

## 2024-04-09 DIAGNOSIS — E785 Hyperlipidemia, unspecified: Secondary | ICD-10-CM

## 2024-05-11 ENCOUNTER — Other Ambulatory Visit: Payer: Self-pay | Admitting: "Endocrinology

## 2024-05-14 ENCOUNTER — Other Ambulatory Visit: Payer: Self-pay | Admitting: Family Medicine

## 2024-05-14 ENCOUNTER — Telehealth: Payer: Self-pay | Admitting: *Deleted

## 2024-05-14 DIAGNOSIS — E119 Type 2 diabetes mellitus without complications: Secondary | ICD-10-CM

## 2024-05-14 DIAGNOSIS — Z794 Long term (current) use of insulin: Secondary | ICD-10-CM

## 2024-05-14 MED ORDER — FREESTYLE LIBRE 3 SENSOR MISC
1.0000 | 1 refills | Status: DC
Start: 2024-05-14 — End: 2024-05-19

## 2024-05-14 NOTE — Telephone Encounter (Signed)
 Copied from CRM [id number]. Topic: Clinical - Medication Refill >> May 14, 2024  2:45 PM Shereese L wrote: Medication: Continuous Glucose Receiver (FREESTYLE LIBRE 3 READER) DEVI Continuous Glucose Sensor (FREESTYLE LIBRE 3 SENSOR) MISC    Has the patient contacted their pharmacy? Yes (Agent: If no, request that the patient contact the pharmacy for the refill. If patient does not wish to contact the pharmacy document the reason why and proceed with request.) (Agent: If yes, when and what did the pharmacy advise?)  This is the patient's preferred pharmacy:  [facility] [address] ([address] [place] - [address] [place] ([place]) [place] [address] Phone: [phone] Fax: [phone]  Is this the correct pharmacy for this prescription? Yes If no, delete pharmacy and type the correct one.   Has the prescription been filled recently? Yes  Is the patient out of the medication? Yes  Has the patient been seen for an appointment in the last year OR does the patient have an upcoming appointment? Yes  Can we respond through MyChart? Yes  Agent: Please be advised that Rx refills may take up to 3 business days. We ask that you follow-up with your pharmacy.

## 2024-05-14 NOTE — Telephone Encounter (Signed)
 Copied from CRM [id number]. Topic: Referral - Question >> May 14, 2024  2:42 PM Shereese L wrote: Reason for CRM: patient is requesting a call back to go over requesting a referral for a foot and eye doctor

## 2024-05-19 ENCOUNTER — Telehealth: Payer: Self-pay | Admitting: "Endocrinology

## 2024-05-19 DIAGNOSIS — E119 Type 2 diabetes mellitus without complications: Secondary | ICD-10-CM

## 2024-05-19 MED ORDER — FREESTYLE LIBRE 3 SENSOR MISC: 1.0000 | 1 refills | Status: AC

## 2024-05-19 NOTE — Telephone Encounter (Signed)
 Patient is scheduled for 07/01/2024 at 1:20 PM.

## 2024-05-19 NOTE — Telephone Encounter (Signed)
 Prescription has been sent.

## 2024-05-19 NOTE — Telephone Encounter (Signed)
 MEDICATION: Continuous Glucose Sensor (FREESTYLE LIBRE 3 SENSOR) MISC Me  PHARMACY:    [id number] - [place] ([place]), [place] - [address] (Ph: [phone])    HAS THE PATIENT CONTACTED THEIR PHARMACY?  Yes  LAST REFILL:  @@LASTREFILL @  IS THIS A 90 DAY SUPPLY : Yes  IS PATIENT OUT OF MEDICATION: Yes  IF NOT; HOW MUCH IS LEFT:   LAST APPOINTMENT DATE: @6 /12/2002  NEXT APPOINTMENT DATE:@11 /19/2025  DO WE HAVE YOUR PERMISSION TO LEAVE A DETAILED MESSAGE?:Yes  OTHER COMMENTS:    **Let patient know to contact pharmacy at the end of the day to make sure medication is ready. **  ** Please notify patient to allow 48-72 hours to process**  **Encourage patient to contact the pharmacy for refills or they can request refills through [facility]**

## 2024-05-25 ENCOUNTER — Other Ambulatory Visit (HOSPITAL_COMMUNITY): Payer: Self-pay | Admitting: Internal Medicine

## 2024-05-25 NOTE — Progress Notes (Signed)
 Patient ID: [name] [name], male   DOB: 1956-07-17, 68 y.o.   MRN: [id number] PCP: Mercer Clotilda SAUNDERS, MD HF Cardiology: Dr. Rolan  Chief Complaint: HF follow up  68 y.o. with history of HTN, diabetes, paroxysmal atrial fibrillation, and CAD presents for cardiology followup.  He was admitted in 5/13 with chest pain with exertion.  LHC was done showing global HK with EF 45% and diffuse, severe distal and branch vessel disease.  There was no interventional option, but it was suspect that this disease could be causing his anginal-type pain.  Echo at that time was read as showing EF 60-65%.  He was admitted in 5/14 with hypertensive urgency and chest pain.  He ruled out for MI and BP was controlled.  His EF was 50-55% by echo.  ETT-Sestamibi done as outpatient showed small fixed inferior defect with no ischemia but EF was 31%.  He was quite hypertensive during the study.  Cardiac MRI done to confirm EF in 5/14 showed EF 44%.  He had an episode of atrial fibrillation/RVR in 3/15 but went back into NSR.  Echo in 2/16 showed EF 55-60% with moderate diastolic dysfunction.   He had a Cardiolite  in 9/16 showing inferior ischemia.  He was having exertional dyspnea with less activity than normal.  There was concern for worsened CAD and he was sent for [facility] in 9/16. By RHC, filling pressures were not significantly elevated. He had a severe distal RCA stenosis that was treated with DES x 2.  Echo showed EF 55-60%.    In 10/16, he was admitted with acute cholecystitis.  He received a cholecystostomy tube.  Cholecystectomy done in 6/17.   Atrial fibrillation in 1/17, cardioverted to NSR.   Recurrent concerning chest pain in 3/17.  Repeat LHC showed diffuse distal and branch vessel disease consistent with poorly-controlled DM, but patent RCA stents.    He was admitted with atrial fibrillation in 12/17. Started on sotalol  and converted to NSR.    He had recurrent chest pain in 5/18 with abnormal  Cardiolite .  Cath was done again in 5/18, showing diffuse CAD with no good interventional options.   Admitted 11/20 with increased dyspnea and exertional chest pain. Hgb went down to 8.1. GI consulted.  Had LHC that showed severe distal and branch vessel disease but no interventional targets and not good anatomy for CABG. Continued on medical management. Capsule endoscopy without recent or active bleeding, no AVMs. He went into atrial flutter during his hospitalization. He went back into NSR, however, after discharge.  +Snoring and daytime sleepiness.   He has had episodes of syncope associated with vigorous coughing.   He was in the ER with atrial flutter and dyspnea in 4/21. Zio monitor in 9/22 showed NSR with PACs.   Evaluated in ED 01/14/22 for cough, found to be COVID +. Received molnupiravir .  Admitted 01/22/22 with CP. Cards consulted, troponin negative x 2, Echo showed EF 55-60%, severe asymmetric LV hypertrophy, normal RV. No new WMA and CP resolved, so LHC deferred.  Coreg  decreased to 12.5 bid due to low BP and Mobitz Type 1. Discharged home, weight 200 lbs.   cMRI 10/23 concerning for hypertrophic cardiomyopathy.  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  LMNA mutations can cause a HCM phenotype.   Scheduled for TEE/DCCV for recurrent atrial flutter in 5/24. LA appendage thrombus seen so DCCV aborted (noncompliant with Eliquis ). He was brought back for repeat  TEE/DCCV in 7/24 after being compliant with Eliquis . He was in NSR when he came in for the procedure.   He was admitted in 1/25 at [facility] with expressive aphasia that resolved.  He had not been taking any medications except for occasionally Eliquis .  Imaging did not show CVA, TIA suspected. Echo showed EF 60-65% with severe LVH and normal RV. Carotid dopplers showed 40-59% LICA stenosis. LDL was 159 (not taking statin) and HgbA1c was 13.2 (not compliant with diabetes  regimen).   Post hospital follow up 1/25, he was in atrial flutter. Not on sotalol  => was not on it when he went to the hospital in [facility] and they did not restart it. NYHA II and volume stable. He was loaded with amiodarone  with plan to follow up in 2 weeks. If remained in AFL, arrange TEE-guided DCCV (given poor compliance and history of LA appendage thrombus).  Today he returns for HF follow up, with daughter present via phone. Overall feeling fair. He has SOB walking short distances on flat ground. He has had chest pain for past 3 days, mostly when getting up and down. Exercise makes pain better. Feet are swelling and he has occasional dizziness, no falls. Denies palpitations, abnormal bleeding, or PND/Orthopnea. Chronically sleeps reclined. He is not using Cardiomems. He is not weighing at home. Taking all medications. He has CPAP but not wearing it. Drinks 1-2 beers/week, uses tylenol  for chronic back pain.  ECG (personally reviewed): atypical atrial flutter rate 63 bpm  Cardiomems: not using  Labs (1/24): K 3.7, creatinine 1.64 Labs (5/24): K 2.8, creatinine 2.01 Labs (7/24): LDL 48 Labs (1/25): K 3.6, creatinine 1.45, LDL 159  PMH: 1. DM2 2. Gout 3. HTN: Cough with ACEI.  4. Cardiomyopathy: Suspect mixed ischemic/nonischemic (?ETOH-related).  EF 35% in 2008.  Echo in 5/13 showed EF 60-65% with moderate LVH but EF was 45% on LV-gram in 5/13.  Echo (5/14) with EF 50-55%, mild LVH, inferobasal HK, mild MR.  ETT-Sestamibi in 5/14, however, showed EF 31%.  Cardiac MRI (6/14) with EF 44%, mild global hypokinesis, subepicardial delayed enhancement in a nonspecific RV insertion site pattern.  Echo (2/16) with EF 55-60%, moderate diastolic dysfunction. RHC (9/16) with mean RA 8, PA 39/20 mean 28, mean PCWP 14, CI 2.2. Echo (9/16) with EF 55-60%, grade II diastolic dysfunction.  - Echo (6/17) with EF 55-60%, moderate LVH, grade II diastolic dysfunction, mild AI, mild MR.  - RHC (11/20): mean  RA 4, PA 46/14 mean 26, mean PCWP 13, CI 3.23 - Echo (9/20): EF 60-65%, moderate LVH, normal RV size and systolic function, mild-moderate MR, mild AI.  - Echo (5/21): EF 55%, RV normal - Has Cardiomems.  - Echo (6/23): EF 55-60%, severe asymmetric LV hypertrophy, normal RV. - cMRI (11/23): LVEF 47%, no LVOT, moderate to severe asymmetric hypertrophy of the basal to mid inferoseptal and anteroseptum, RVEF 46%, ECV 34%, non-coronary LGE pattern most consistent with hypertrophic CM - Echo (1/25): EF 60-65%, severe LVH, normal RV 5. CAD: LHC in 2010 with mild nonobstructive disease.  LHC (5/13) with diffuse distal and branch vessel disease, mild global hypokinesis and EF 45%.  ETT-Sestamibi (5/14): EF 31%, small fixed inferior defect with no ischemia.  Lexiscan  Cardiolite  (9/16) with EF 26%, moderate inferior defect that was partially reversible, suggesting ischemia => High risk study. LHC (9/16) with 40-50% mLAD, diffuse up to 50% distal LAD, 90% ostium of branch off ramus, 95% dRCA => DES to RCA x 2, EF 55-60%.   -  LHC (3/17) with diffuse branch and distal vessel disease c/w poorly controlled DM, RCA stents patent.  - LHC (5/18): Diffuse CAD with no good interventional option. 60% mRCA, 90% dLAD, 50% serial mid ramus stenoses, ramus branches with ostial 80-90% stenoses. - LHC (11/20): Severe distal and branch vessel disease but no interventional targets and not good anatomy for CABG  6. H/o chronic HBV 7. Osteochondroma left shoulder.  8. Hyperlipidemia 9. Paroxysmal atrial fibrillation: Coumadin .  Developed cough and increased ESR with amiodarone .  DCCV in 1/17.  Recurrent atrial fibrillation in 12/17, sotalol  started.  10. Atrial flutter: had ablations in 2007 and 2008.  - Zio patch in 4/21 with 20% atrial flutter.  11. OSA: Does not use CPAP.  12. Acute cholecystitis (10/16): Cholecystostomy tube placed.  Cholecystectomy 6/17.  13. CKD stage 3 14. Anemia - Suspected GI bleed but EGD/c-scope  9/20 without significant findings.  - Capsule Endoscopy (11/20) without recent or active bleeding, no AVMs.  15. Syncope: Suspected cough syncope.  16. OSA 17. TIA (1/25): Carotid dopplers (1/25) with 40-59% LICA stenosis.   SH: Married, prior smoker.  Some ETOH, occasionally heavy in past.  Does use occasional marijuana. Out of work Chiropractor. 2 daughters in grad school.   FH: CAD  ROS: All systems reviewed and negative except as per HPI.   Current Outpatient Medications  Medication Sig Dispense Refill   ACCU-CHEK SOFTCLIX LANCETS lancets Use as instructed for three times daily testing of blood glucose 100 each 12   acetaminophen  (TYLENOL ) 500 MG tablet Take 1,000 mg by mouth every 6 (six) hours as needed (pain).     albuterol  (VENTOLIN  HFA) 108 (90 Base) MCG/ACT inhaler Inhale 1-2 puffs into the lungs every 6 (six) hours as needed for wheezing or shortness of breath. 8 g 0   amiodarone  (PACERONE ) 200 MG tablet Take 1 tablet (200 mg total) by mouth 2 (two) times daily for 10 days, THEN 1 tablet (200 mg total) daily. 90 tablet 3   atorvastatin  (LIPITOR ) 80 MG tablet TAKE 1 TABLET BY MOUTH ONCE DAILY 90 tablet 11   Blood Glucose Monitoring Suppl DEVI 1 each by Does not apply route in the morning, at noon, and at bedtime. May substitute to any manufacturer covered by patient's insurance. 1 each 0   carvedilol  (COREG ) 12.5 MG tablet TAKE 1 TABLET BY MOUTH TWICE DAILY WITH MEALS *PATIENT NEEDS APPOINTMENT* 60 tablet 11   Continuous Glucose Receiver (FREESTYLE LIBRE 3 READER) DEVI 1 Device by Does not apply route continuous. 2 each 11   Continuous Glucose Sensor (FREESTYLE LIBRE 3 SENSOR) MISC 1 Device by Does not apply route every 14 (fourteen) days. 6 each 1   ELIQUIS  5 MG TABS tablet TAKE 1 TABLET BY MOUTH TWICE DAILY 180 tablet 11   eplerenone  (INSPRA ) 25 MG tablet Take 1 tablet (25 mg total) by mouth daily. 30 tablet 8   ezetimibe  (ZETIA ) 10 MG tablet TAKE 1 TABLET BY MOUTH ONCE DAILY 30  tablet 10   Glucose Blood (BLOOD GLUCOSE TEST STRIPS) STRP 1 each by In Vitro route in the morning, at noon, and at bedtime. May substitute to any manufacturer covered by patient's insurance. 100 strip 11   hydrALAZINE  (APRESOLINE ) 50 MG tablet TAKE 1 TABLET BY MOUTH TWICE DAILY IN THE MORNING AND AT BEDTIME 60 tablet 11   insulin  glargine (LANTUS ) 100 UNIT/ML injection Inject 0.35 mLs (35 Units total) into the skin at bedtime.     insulin  lispro (HUMALOG ) 100 UNIT/ML injection  Inject 1 Units into the skin 3 (three) times daily with meals. Sliding scale 70-140=0 Units 141-170=1 Units 171-200=2 Units 201-230=3 Units 231-260=4 Units 261-290=5 Units 291-320=6 Units 231-350=7 Units 351-380=8 Units Over 380 10 units     Insulin  Pen Needle (PEN NEEDLES) 32G X 4 MM MISC 1 each by Does not apply route daily. 100 each 3   isosorbide  mononitrate (IMDUR ) 120 MG 24 hr tablet TAKE 1 TABLET BY MOUTH ONCE DAILY 90 tablet 11   JARDIANCE  25 MG TABS tablet TAKE 1 TABLET BY MOUTH ONCE DAILY CONTACT OFFICE FOR APPOINTMENT 30 tablet 10   Lancets Misc. MISC 1 each by Does not apply route in the morning, at noon, and at bedtime. May substitute to any manufacturer covered by patient's insurance. 100 each 11   losartan  (COZAAR ) 25 MG tablet Take 1 tablet (25 mg total) by mouth daily. 90 tablet 3   metFORMIN  (GLUCOPHAGE ) 1000 MG tablet TAKE 1 TABLET BY MOUTH ONCE DAILY WITH BREAKFAST *PATIENT NEEDS APPOINTMENT* 30 tablet 0   nitroGLYCERIN  (NITROSTAT ) 0.4 MG SL tablet DISSOLVE 1 TABLET UNDER THE TONGUE AS NEEDED FOR CHEST PAIN EVERY 5 MINUTES UP TO 3 TIMES. IF NO RELIEF CALL 911. 25 tablet 10   oxyCODONE -acetaminophen  (PERCOCET/ROXICET) 5-325 MG tablet Take 1 tablet by mouth every 6 (six) hours as needed for severe pain (pain score 7-10). 10 tablet 0   potassium chloride  SA (KLOR-CON  M) 20 MEQ tablet TAKE 1 TABLET BY MOUTH ONCE DAILY 90 tablet 11   sotalol  (BETAPACE ) 120 MG tablet Take 120 mg by mouth 2 (two) times  daily.     torsemide  (DEMADEX ) 20 MG tablet TAKE 2 TABLETS BY MOUTH TWICE DAILY 120 tablet 11   No current facility-administered medications for this visit.   There were no vitals taken for this visit.   Wt Readings from Last 3 Encounters:  01/23/24 89.9 kg (198 lb 3.2 oz)  01/02/24 89.4 kg (197 lb 3.2 oz)  12/09/23 92.6 kg (204 lb 3.2 oz)   PHYSICAL EXAM: General:  NAD. No resp difficulty, walked into clinic HEENT: Normal Neck: Supple. JVP 8-10 Cor: Irregular rate & rhythm. No rubs, gallops or murmurs. Lungs: Clear Abdomen: Soft, obese, nontender, nondistended.  Extremities: No cyanosis, clubbing, rash, trace pedal edema Neuro: Alert & oriented x 3, moves all 4 extremities w/o difficulty. Affect pleasant.  ASSESSMENT/PLAN 1. Chronic diastolic CHF: EF 52% on cardiac MRI in 6/14 but EF improved back to 55-60% on 2/16 echo.  However, EF down to 26% on Cardiolite  in 9/16.  Echo in 9/20 showed EF 60-65%, moderate LVH, normal RV, mild-moderate MR.  Echo in 5/21 with EF 55%, normal RV.  He has a Cardiomems device, but has not used in months.  Echo in 6/23 showed EF 55-60% with severe asymmetric septal hypertrophy.  Cardiac MRI (10/23) showed LVEF 47%, no LVOT, moderate to severe asymmetric hypertrophy of the basal to mid inferoseptal and anteroseptum, RVEF 46%, ECV 34%, non-coronary LGE pattern most consistent with hypertrophic CM. 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  LMNA mutations can cause a HCM phenotype.  Echo (1/25) with EF 60-65%, severe LVH, normal RV.  NYHA class II-IIb symptoms, functional status confounded by chronic back pain. He does not appear markedly volume overloaded, but weight up 3 lbs - Restart eplerenone  25 mg daily. BMET/BNP today, repeat BMET in 1 week - Continue torsemide  40 mg bid.   - Continue empagliflozin  10 mg daily.  No GU symptoms. - Continue carvedilol  12.5 mg bid. - Continue losartan  25 mg  daily.  - He has been referred again to genetic evaluation with Dr. Danford Pac given LMNA mutation that may be associated with HCM phenotype. We have tried several times to set up this appointment but to no avail.  He says he is willing to go.  His daughters will help facilitate.   - Discussed cardiomems usage, he says that he will restart.  Daughter says she will help him with this 2. Atrial fibrillation/flutter: Paroxysmal. He has had atrial flutter ablations x 2 remotely.  He was on sotalol . He tolerates atrial arrhythmias poorly, seems to worsen CHF.  Zio patch in 4/21 with 20% atrial flutter. He saw EP, repeat ablation was not recommended. Repeat Zio in 9/22 showed only NSR and PACs.  Sotalol  stopped due to poor compliance.  He was back in AFL at 1/25 follow up, off sotalol . He was loaded with amiodarone  and remains in rate controlled AFL today. - Continue amiodarone  200 mg daily. Repeat LFTs today, mildly elevated at last check. Discussed decreasing Tylenol  use and cutting back on beer. - Continue Eliquis  5 mg bid. CBC today. - Will arrange for TEE-guided DCCV. Given poor compliance generally as well as history of LA appendage thrombus, he should get a TEE prior to DCCV regardless.   Informed Consent   Shared Decision Making/Informed Consent   The risks [stroke, cardiac arrhythmias rarely resulting in the need for a temporary or permanent pacemaker, skin irritation or burns, esophageal damage, perforation (1:10,000 risk), bleeding, pharyngeal hematoma as well as other potential complications associated with conscious sedation including aspiration, arrhythmia, respiratory failure and death], benefits (treatment guidance, restoration of normal sinus rhythm, diagnostic support) and alternatives of a transesophageal echocardiogram guided cardioversion were discussed in detail with Mr. Bahe and he is willing to proceed.     3. Coronary artery disease: LHC in 9/16 showed diffuse CAD, most  significant involving the distal RCA.  Patient had DES x 2 to RCA.  Cath in 3/17 with patent RCA stents and diffuse distal and branch vessel disease concerning for poorly controlled diabetes.  Cath in 5/18 again showed diffuse CAD without good interventional target.  Cath in 11/20 showed severe distal and branch vessel disease but no interventional targets and not good anatomy for CABG.  He has had atypical chest pain for past 3 days, suspect related to AFL vs mild volume overload. - No ASA, given Eliquis  use.  - He has restarted atorvastatin  80 mg daily, was not taking up until recent hospital admission. Check lipids in 2 months. - Continue Coreg  and Imdur  daily for angina control.   4. Hyperlipidemia: he is now back on atorvastatin  as above.  5. Hypertension: BP mildly elevated today. - Restart eplerenone , as above. 6. OSA: He cannot tolerate CPAP.  - With recurrent atrial arrhythmias, refer back to Dr. Shlomo to discuss options 7. CKD 3: Continue Jardiance . BMET today.  - Follow renal function closely with addition of eplerenone  today. 8. DM: Has been poorly compliant. Followup with endocrinology.  9. ETOH use: Discussed limiting ETOH use. - He has cut back to 2 beers/week. 10. Non-compliance: Chronic problem.  Long discussion today about the need to take his medications.  He seems motivated to improve.  - Consider restarting paramedicine to help with compliance. His meds are in bubble packs. 11. TIA: TIA in 1/25, probably due to noncompliance with Eliquis .   Follow up in 2-3 weeks with APP,  after TEE/DCCV.  Harlene HERO [facility] FNP-BC 05/25/2024

## 2024-05-26 ENCOUNTER — Telehealth (HOSPITAL_COMMUNITY): Payer: Self-pay

## 2024-05-26 NOTE — Telephone Encounter (Signed)
 Called to confirm/remind patient of their appointment at the [facility] on 05/27/24.   Appointment:   [x] Confirmed  [] Left mess   [] No answer/No voice mail  [] VM Full/unable to leave message  [] Phone not in service  Patient reminded to bring all medications and/or complete list.  Confirmed patient has transportation. Gave directions, instructed to utilize valet parking.

## 2024-05-27 ENCOUNTER — Encounter (HOSPITAL_COMMUNITY): Payer: Self-pay

## 2024-05-27 ENCOUNTER — Ambulatory Visit (HOSPITAL_COMMUNITY)
Admission: RE | Admit: 2024-05-27 | Discharge: 2024-05-27 | Disposition: A | Discharge status: Home / Self Care | Source: Ambulatory Visit | Attending: Family Medicine | Admitting: Family Medicine

## 2024-05-27 VITALS — BP 164/98 | HR 86 | Wt 194.4 lb

## 2024-05-27 DIAGNOSIS — Z955 Presence of coronary angioplasty implant and graft: Secondary | ICD-10-CM | POA: Diagnosis not present

## 2024-05-27 DIAGNOSIS — F1010 Alcohol abuse, uncomplicated: Secondary | ICD-10-CM

## 2024-05-27 DIAGNOSIS — I25119 Atherosclerotic heart disease of native coronary artery with unspecified angina pectoris: Secondary | ICD-10-CM | POA: Diagnosis not present

## 2024-05-27 DIAGNOSIS — I10 Essential (primary) hypertension: Secondary | ICD-10-CM

## 2024-05-27 DIAGNOSIS — G4733 Obstructive sleep apnea (adult) (pediatric): Secondary | ICD-10-CM | POA: Insufficient documentation

## 2024-05-27 DIAGNOSIS — Z7984 Long term (current) use of oral hypoglycemic drugs: Secondary | ICD-10-CM | POA: Diagnosis not present

## 2024-05-27 DIAGNOSIS — Z8639 Personal history of other endocrine, nutritional and metabolic disease: Secondary | ICD-10-CM

## 2024-05-27 DIAGNOSIS — Z91199 Patient's noncompliance with other medical treatment and regimen due to unspecified reason: Secondary | ICD-10-CM | POA: Diagnosis not present

## 2024-05-27 DIAGNOSIS — I5032 Chronic diastolic (congestive) heart failure: Secondary | ICD-10-CM | POA: Diagnosis not present

## 2024-05-27 DIAGNOSIS — Z79899 Other long term (current) drug therapy: Secondary | ICD-10-CM | POA: Diagnosis not present

## 2024-05-27 DIAGNOSIS — I480 Paroxysmal atrial fibrillation: Secondary | ICD-10-CM | POA: Diagnosis not present

## 2024-05-27 DIAGNOSIS — E785 Hyperlipidemia, unspecified: Secondary | ICD-10-CM | POA: Insufficient documentation

## 2024-05-27 DIAGNOSIS — I130 Hypertensive heart and chronic kidney disease with heart failure and stage 1 through stage 4 chronic kidney disease, or unspecified chronic kidney disease: Secondary | ICD-10-CM | POA: Insufficient documentation

## 2024-05-27 DIAGNOSIS — G459 Transient cerebral ischemic attack, unspecified: Secondary | ICD-10-CM | POA: Diagnosis not present

## 2024-05-27 DIAGNOSIS — I4892 Unspecified atrial flutter: Secondary | ICD-10-CM | POA: Insufficient documentation

## 2024-05-27 DIAGNOSIS — N183 Chronic kidney disease, stage 3 unspecified: Secondary | ICD-10-CM | POA: Diagnosis not present

## 2024-05-27 DIAGNOSIS — E1122 Type 2 diabetes mellitus with diabetic chronic kidney disease: Secondary | ICD-10-CM | POA: Diagnosis not present

## 2024-05-27 DIAGNOSIS — Z7901 Long term (current) use of anticoagulants: Secondary | ICD-10-CM | POA: Insufficient documentation

## 2024-05-27 DIAGNOSIS — R0789 Other chest pain: Secondary | ICD-10-CM | POA: Insufficient documentation

## 2024-05-27 DIAGNOSIS — Z8673 Personal history of transient ischemic attack (TIA), and cerebral infarction without residual deficits: Secondary | ICD-10-CM | POA: Diagnosis not present

## 2024-05-27 DIAGNOSIS — I2510 Atherosclerotic heart disease of native coronary artery without angina pectoris: Secondary | ICD-10-CM | POA: Insufficient documentation

## 2024-05-27 MED ORDER — LOSARTAN POTASSIUM 25 MG PO TABS: 25.0000 mg | ORAL_TABLET | Freq: Every day | ORAL | 3 refills | Status: DC

## 2024-05-27 NOTE — Patient Instructions (Addendum)
 Medication Changes:  RESTART LOSARTAN  25MG  ONCE DAILY   PLEASE BRING ALL MEDICATIONS TO YOUR NEXT FOLLOW UP WITH US    Lab Work:  Labs done today, your results will be available in MyChart, we will contact you for abnormal readings.  Referrals:  Referral to Afib clinic--this has been scheduled   YOU HAVE BEEN REFERRED TO PULMONOLOGY THEY WILL REACH OUT TO YOU OR CALL TO ARRANGE THIS. PLEASE CALL US  WITH ANY CONCERNS   Follow-Up in: 2 WEEKS AS SCHEDULED   At the [facility], you and your health needs are our priority. We have a designated team specialized in the treatment of Heart Failure. This Care Team includes your primary Heart Failure Specialized Cardiologist (physician), Advanced Practice Providers (APPs- Physician Assistants and Nurse Practitioners), and Pharmacist who all work together to provide you with the care you need, when you need it.   You may see any of the following providers on your designated Care Team at your next follow up:  Dr. Toribio Fuel Dr. Ezra Shuck Dr. Ria Commander Dr. Odis Brownie Greig Mosses, NP Caffie Shed, [place] [facility] [place], [place] Beckey Coe, NP [place] Lee, NP Tinnie Redman, PharmD   Please be sure to bring in all your medications bottles to every appointment.   Need to Contact Us :  If you have any questions or concerns before your next appointment please send us  a message through [place] or call our office at [phone].    TO LEAVE A MESSAGE FOR THE NURSE SELECT OPTION 2, PLEASE LEAVE A MESSAGE INCLUDING: YOUR NAME DATE OF BIRTH CALL BACK NUMBER REASON FOR CALL**this is important as we prioritize the call backs  YOU WILL RECEIVE A CALL BACK THE SAME DAY AS LONG AS YOU CALL BEFORE 4:00 PM

## 2024-05-29 ENCOUNTER — Ambulatory Visit (HOSPITAL_COMMUNITY): Payer: Self-pay | Admitting: Family Medicine

## 2024-06-02 ENCOUNTER — Telehealth: Payer: Self-pay

## 2024-06-02 NOTE — Telephone Encounter (Signed)
 Patient was identified as falling into the [place] Measure - Diabetes.   Patient was: Appointment already scheduled for:  06/05/2024.

## 2024-06-03 ENCOUNTER — Emergency Department (HOSPITAL_COMMUNITY)

## 2024-06-03 ENCOUNTER — Encounter (HOSPITAL_COMMUNITY): Payer: Self-pay | Admitting: Hospitalist

## 2024-06-03 ENCOUNTER — Inpatient Hospital Stay (HOSPITAL_COMMUNITY): Admission: EM | Admit: 2024-06-03 | Discharge: 2024-06-08 | DRG: 304 | Disposition: A | Discharge status: Home Health

## 2024-06-03 ENCOUNTER — Other Ambulatory Visit: Payer: Self-pay

## 2024-06-03 DIAGNOSIS — I480 Paroxysmal atrial fibrillation: Secondary | ICD-10-CM | POA: Diagnosis present

## 2024-06-03 DIAGNOSIS — R0789 Other chest pain: Secondary | ICD-10-CM | POA: Diagnosis present

## 2024-06-03 DIAGNOSIS — G4700 Insomnia, unspecified: Secondary | ICD-10-CM | POA: Diagnosis present

## 2024-06-03 DIAGNOSIS — R7989 Other specified abnormal findings of blood chemistry: Secondary | ICD-10-CM | POA: Diagnosis not present

## 2024-06-03 DIAGNOSIS — Z860100 Personal history of colon polyps, unspecified: Secondary | ICD-10-CM

## 2024-06-03 DIAGNOSIS — N179 Acute kidney failure, unspecified: Secondary | ICD-10-CM | POA: Diagnosis present

## 2024-06-03 DIAGNOSIS — Z91148 Patient's other noncompliance with medication regimen for other reason: Secondary | ICD-10-CM | POA: Diagnosis not present

## 2024-06-03 DIAGNOSIS — I10 Essential (primary) hypertension: Secondary | ICD-10-CM | POA: Diagnosis present

## 2024-06-03 DIAGNOSIS — I2510 Atherosclerotic heart disease of native coronary artery without angina pectoris: Secondary | ICD-10-CM | POA: Diagnosis not present

## 2024-06-03 DIAGNOSIS — R410 Disorientation, unspecified: Secondary | ICD-10-CM | POA: Diagnosis not present

## 2024-06-03 DIAGNOSIS — R29703 NIHSS score 3: Secondary | ICD-10-CM | POA: Diagnosis not present

## 2024-06-03 DIAGNOSIS — E785 Hyperlipidemia, unspecified: Secondary | ICD-10-CM | POA: Diagnosis not present

## 2024-06-03 DIAGNOSIS — R079 Chest pain, unspecified: Secondary | ICD-10-CM

## 2024-06-03 DIAGNOSIS — I4891 Unspecified atrial fibrillation: Secondary | ICD-10-CM | POA: Diagnosis not present

## 2024-06-03 DIAGNOSIS — I6502 Occlusion and stenosis of left vertebral artery: Secondary | ICD-10-CM | POA: Diagnosis not present

## 2024-06-03 DIAGNOSIS — R569 Unspecified convulsions: Secondary | ICD-10-CM | POA: Diagnosis not present

## 2024-06-03 DIAGNOSIS — I517 Cardiomegaly: Secondary | ICD-10-CM | POA: Diagnosis not present

## 2024-06-03 DIAGNOSIS — N1831 Chronic kidney disease, stage 3a: Secondary | ICD-10-CM | POA: Diagnosis present

## 2024-06-03 DIAGNOSIS — Z8249 Family history of ischemic heart disease and other diseases of the circulatory system: Secondary | ICD-10-CM | POA: Diagnosis not present

## 2024-06-03 DIAGNOSIS — I6389 Other cerebral infarction: Secondary | ICD-10-CM | POA: Diagnosis not present

## 2024-06-03 DIAGNOSIS — Z87440 Personal history of urinary (tract) infections: Secondary | ICD-10-CM

## 2024-06-03 DIAGNOSIS — N281 Cyst of kidney, acquired: Secondary | ICD-10-CM | POA: Diagnosis not present

## 2024-06-03 DIAGNOSIS — E1165 Type 2 diabetes mellitus with hyperglycemia: Secondary | ICD-10-CM | POA: Diagnosis present

## 2024-06-03 DIAGNOSIS — I4892 Unspecified atrial flutter: Secondary | ICD-10-CM | POA: Diagnosis present

## 2024-06-03 DIAGNOSIS — Z8619 Personal history of other infectious and parasitic diseases: Secondary | ICD-10-CM

## 2024-06-03 DIAGNOSIS — I5030 Unspecified diastolic (congestive) heart failure: Secondary | ICD-10-CM | POA: Diagnosis not present

## 2024-06-03 DIAGNOSIS — F1010 Alcohol abuse, uncomplicated: Secondary | ICD-10-CM | POA: Diagnosis present

## 2024-06-03 DIAGNOSIS — J929 Pleural plaque without asbestos: Secondary | ICD-10-CM | POA: Diagnosis not present

## 2024-06-03 DIAGNOSIS — I6782 Cerebral ischemia: Secondary | ICD-10-CM | POA: Diagnosis not present

## 2024-06-03 DIAGNOSIS — E1151 Type 2 diabetes mellitus with diabetic peripheral angiopathy without gangrene: Secondary | ICD-10-CM | POA: Diagnosis not present

## 2024-06-03 DIAGNOSIS — E876 Hypokalemia: Secondary | ICD-10-CM | POA: Diagnosis not present

## 2024-06-03 DIAGNOSIS — I509 Heart failure, unspecified: Secondary | ICD-10-CM | POA: Diagnosis not present

## 2024-06-03 DIAGNOSIS — G9340 Encephalopathy, unspecified: Secondary | ICD-10-CM | POA: Diagnosis not present

## 2024-06-03 DIAGNOSIS — Z7984 Long term (current) use of oral hypoglycemic drugs: Secondary | ICD-10-CM | POA: Diagnosis not present

## 2024-06-03 DIAGNOSIS — Z7901 Long term (current) use of anticoagulants: Secondary | ICD-10-CM

## 2024-06-03 DIAGNOSIS — E119 Type 2 diabetes mellitus without complications: Secondary | ICD-10-CM

## 2024-06-03 DIAGNOSIS — Z87891 Personal history of nicotine dependence: Secondary | ICD-10-CM | POA: Diagnosis not present

## 2024-06-03 DIAGNOSIS — T45516A Underdosing of anticoagulants, initial encounter: Secondary | ICD-10-CM | POA: Diagnosis not present

## 2024-06-03 DIAGNOSIS — I6522 Occlusion and stenosis of left carotid artery: Secondary | ICD-10-CM | POA: Diagnosis present

## 2024-06-03 DIAGNOSIS — E1122 Type 2 diabetes mellitus with diabetic chronic kidney disease: Secondary | ICD-10-CM | POA: Diagnosis present

## 2024-06-03 DIAGNOSIS — I422 Other hypertrophic cardiomyopathy: Secondary | ICD-10-CM | POA: Diagnosis not present

## 2024-06-03 DIAGNOSIS — Z8673 Personal history of transient ischemic attack (TIA), and cerebral infarction without residual deficits: Secondary | ICD-10-CM

## 2024-06-03 DIAGNOSIS — I169 Hypertensive crisis, unspecified: Secondary | ICD-10-CM | POA: Diagnosis present

## 2024-06-03 DIAGNOSIS — I5032 Chronic diastolic (congestive) heart failure: Secondary | ICD-10-CM | POA: Diagnosis present

## 2024-06-03 DIAGNOSIS — Z79899 Other long term (current) drug therapy: Secondary | ICD-10-CM | POA: Diagnosis not present

## 2024-06-03 DIAGNOSIS — R519 Headache, unspecified: Secondary | ICD-10-CM | POA: Diagnosis not present

## 2024-06-03 DIAGNOSIS — I4819 Other persistent atrial fibrillation: Secondary | ICD-10-CM | POA: Diagnosis not present

## 2024-06-03 DIAGNOSIS — I130 Hypertensive heart and chronic kidney disease with heart failure and stage 1 through stage 4 chronic kidney disease, or unspecified chronic kidney disease: Secondary | ICD-10-CM | POA: Diagnosis present

## 2024-06-03 DIAGNOSIS — G9341 Metabolic encephalopathy: Secondary | ICD-10-CM | POA: Diagnosis present

## 2024-06-03 DIAGNOSIS — Z833 Family history of diabetes mellitus: Secondary | ICD-10-CM

## 2024-06-03 DIAGNOSIS — R29818 Other symptoms and signs involving the nervous system: Secondary | ICD-10-CM | POA: Diagnosis not present

## 2024-06-03 DIAGNOSIS — Z85830 Personal history of malignant neoplasm of bone: Secondary | ICD-10-CM

## 2024-06-03 DIAGNOSIS — Z91013 Allergy to seafood: Secondary | ICD-10-CM | POA: Diagnosis not present

## 2024-06-03 DIAGNOSIS — I639 Cerebral infarction, unspecified: Secondary | ICD-10-CM | POA: Diagnosis not present

## 2024-06-03 DIAGNOSIS — Z955 Presence of coronary angioplasty implant and graft: Secondary | ICD-10-CM | POA: Diagnosis not present

## 2024-06-03 DIAGNOSIS — Z794 Long term (current) use of insulin: Secondary | ICD-10-CM | POA: Diagnosis not present

## 2024-06-03 DIAGNOSIS — I6340 Cerebral infarction due to embolism of unspecified cerebral artery: Secondary | ICD-10-CM | POA: Diagnosis not present

## 2024-06-03 DIAGNOSIS — F1210 Cannabis abuse, uncomplicated: Secondary | ICD-10-CM | POA: Diagnosis not present

## 2024-06-03 DIAGNOSIS — I161 Hypertensive emergency: Secondary | ICD-10-CM | POA: Diagnosis not present

## 2024-06-03 DIAGNOSIS — F1090 Alcohol use, unspecified, uncomplicated: Secondary | ICD-10-CM | POA: Diagnosis not present

## 2024-06-03 DIAGNOSIS — R590 Localized enlarged lymph nodes: Secondary | ICD-10-CM | POA: Diagnosis not present

## 2024-06-03 DIAGNOSIS — G4733 Obstructive sleep apnea (adult) (pediatric): Secondary | ICD-10-CM | POA: Diagnosis present

## 2024-06-03 DIAGNOSIS — I110 Hypertensive heart disease with heart failure: Secondary | ICD-10-CM | POA: Diagnosis not present

## 2024-06-03 DIAGNOSIS — R0602 Shortness of breath: Secondary | ICD-10-CM | POA: Diagnosis not present

## 2024-06-03 DIAGNOSIS — R4182 Altered mental status, unspecified: Secondary | ICD-10-CM | POA: Diagnosis not present

## 2024-06-03 LAB — COMPREHENSIVE METABOLIC PANEL WITH GFR
ALT: 16 U/L (ref 0–44)
AST: 22 U/L (ref 15–41)
Albumin: 3 g/dL — ABNORMAL LOW (ref 3.5–5.0)
Alkaline Phosphatase: 65 U/L (ref 38–126)
Anion gap: 7 (ref 5–15)
BUN: 8 mg/dL (ref 8–23)
CO2: 22 mmol/L (ref 22–32)
Calcium: 8.3 mg/dL — ABNORMAL LOW (ref 8.9–10.3)
Chloride: 105 mmol/L (ref 98–111)
Creatinine, Ser: 1.14 mg/dL (ref 0.61–1.24)
GFR, Estimated: >60 mL/min (ref >60)
Glucose, Bld: 234 mg/dL — ABNORMAL HIGH (ref 70–99)
Potassium: 3.3 mmol/L — ABNORMAL LOW (ref 3.5–5.1)
Sodium: 134 mmol/L — ABNORMAL LOW (ref 135–145)
Total Bilirubin: 1.6 mg/dL — ABNORMAL HIGH (ref 0.0–1.2)
Total Protein: 6.9 g/dL (ref 6.5–8.1)

## 2024-06-03 LAB — CBC WITH DIFFERENTIAL/PLATELET
Abs Immature Granulocytes: 0.05 K/uL (ref 0.00–0.07)
Basophils Absolute: 0.1 K/uL (ref 0.0–0.1)
Basophils Relative: 1 %
Eosinophils Absolute: 0.2 K/uL (ref 0.0–0.5)
Eosinophils Relative: 3 %
HCT: 49.4 % (ref 39.0–52.0)
Hemoglobin: 16.1 g/dL (ref 13.0–17.0)
Immature Granulocytes: 1 %
Lymphocytes Relative: 24 %
Lymphs Abs: 1.6 K/uL (ref 0.7–4.0)
MCH: 29 pg (ref 26.0–34.0)
MCHC: 32.6 g/dL (ref 30.0–36.0)
MCV: 88.8 fL (ref 80.0–100.0)
Monocytes Absolute: 0.7 K/uL (ref 0.1–1.0)
Monocytes Relative: 11 %
Neutro Abs: 3.9 K/uL (ref 1.7–7.7)
Neutrophils Relative %: 60 %
Platelets: 175 K/uL (ref 150–400)
RBC: 5.56 MIL/uL (ref 4.22–5.81)
RDW: 15.6 % — ABNORMAL HIGH (ref 11.5–15.5)
WBC: 6.5 K/uL (ref 4.0–10.5)
nRBC: 0 % (ref 0.0–0.2)

## 2024-06-03 LAB — TROPONIN I (HIGH SENSITIVITY)
Troponin I (High Sensitivity): 81 ng/L — ABNORMAL HIGH (ref <18)
Troponin I (High Sensitivity): 75 ng/L — ABNORMAL HIGH (ref <18)

## 2024-06-03 LAB — MRSA NEXT GEN BY PCR, NASAL: MRSA by PCR Next Gen: NOT DETECTED

## 2024-06-03 LAB — GLUCOSE, CAPILLARY: Glucose-Capillary: 295 mg/dL — ABNORMAL HIGH (ref 70–99)

## 2024-06-03 LAB — BRAIN NATRIURETIC PEPTIDE: B Natriuretic Peptide: 321.4 pg/mL — ABNORMAL HIGH (ref 0.0–100.0)

## 2024-06-03 MED ORDER — FUROSEMIDE 10 MG/ML IJ SOLN
20.0000 mg | Freq: Once | INTRAMUSCULAR | Status: AC
  Administered 2024-06-03: 20 mg via INTRAVENOUS
  Filled 2024-06-03: qty 2

## 2024-06-03 MED ORDER — MELATONIN 5 MG PO TABS
5.0000 mg | ORAL_TABLET | Freq: Every evening | ORAL | Status: DC | PRN
  Administered 2024-06-03 – 2024-06-07 (×5): 5 mg via ORAL
  Filled 2024-06-03 (×5): qty 1

## 2024-06-03 MED ORDER — ATORVASTATIN CALCIUM 80 MG PO TABS
80.0000 mg | ORAL_TABLET | Freq: Every day | ORAL | Status: DC
  Administered 2024-06-04 – 2024-06-08 (×5): 80 mg via ORAL
  Filled 2024-06-03 (×5): qty 1

## 2024-06-03 MED ORDER — ACETAMINOPHEN 325 MG PO TABS: 650.0000 mg | ORAL_TABLET | Freq: Four times a day (QID) | ORAL | Status: DC | PRN

## 2024-06-03 MED ORDER — TORSEMIDE 20 MG PO TABS
40.0000 mg | ORAL_TABLET | Freq: Two times a day (BID) | ORAL | Status: DC
  Administered 2024-06-03 – 2024-06-04 (×2): 40 mg via ORAL
  Filled 2024-06-03 (×2): qty 2

## 2024-06-03 MED ORDER — INSULIN GLARGINE-YFGN 100 UNIT/ML ~~LOC~~ SOLN
30.0000 [IU] | Freq: Every day | SUBCUTANEOUS | Status: DC
  Administered 2024-06-03 – 2024-06-05 (×3): 30 [IU] via SUBCUTANEOUS
  Filled 2024-06-03 (×4): qty 0.3

## 2024-06-03 MED ORDER — ALBUTEROL SULFATE (2.5 MG/3ML) 0.083% IN NEBU: 2.5000 mg | INHALATION_SOLUTION | Freq: Four times a day (QID) | RESPIRATORY_TRACT | Status: DC | PRN

## 2024-06-03 MED ORDER — ACETAMINOPHEN 325 MG PO TABS
650.0000 mg | ORAL_TABLET | Freq: Four times a day (QID) | ORAL | Status: DC | PRN
  Administered 2024-06-03 – 2024-06-07 (×7): 650 mg via ORAL
  Filled 2024-06-03 (×9): qty 2

## 2024-06-03 MED ORDER — CARVEDILOL 12.5 MG PO TABS
12.5000 mg | ORAL_TABLET | Freq: Two times a day (BID) | ORAL | Status: DC
  Administered 2024-06-03 – 2024-06-05 (×4): 12.5 mg via ORAL
  Filled 2024-06-03 (×4): qty 1

## 2024-06-03 MED ORDER — NITROGLYCERIN IN D5W 200-5 MCG/ML-% IV SOLN
0.0000 ug/min | INTRAVENOUS | Status: DC
Start: 2024-06-03 — End: 2024-06-08
  Administered 2024-06-03: 5 ug/min via INTRAVENOUS
  Administered 2024-06-04: 35 ug/min via INTRAVENOUS
  Administered 2024-06-05: 30 ug/min via INTRAVENOUS
  Administered 2024-06-05: 25 ug/min via INTRAVENOUS
  Administered 2024-06-05: 20 ug/min via INTRAVENOUS
  Filled 2024-06-03 (×2): qty 250

## 2024-06-03 MED ORDER — APIXABAN 5 MG PO TABS
5.0000 mg | ORAL_TABLET | Freq: Two times a day (BID) | ORAL | Status: DC
  Administered 2024-06-03 – 2024-06-08 (×10): 5 mg via ORAL
  Filled 2024-06-03 (×10): qty 1

## 2024-06-03 NOTE — Plan of Care (Signed)

## 2024-06-03 NOTE — ED Provider Notes (Signed)
 [place] EMERGENCY DEPARTMENT AT [facility] Provider Note   CSN: [id number] Arrival date & time: 06/03/24  9098     Patient presents with: Chest Pain   [name] [name] is a 68 y.o. male.   Patient complains of some chest discomfort and shortness of breath today.  Pt states that he has a history of the same.  Pt has a history of chf.  Patient reports he is not currently having any chest pain.  Patient reports that he has taken his blood pressure medication today.  Patient reports that he has a past medical history of atrial fibrillation.  He states he has not felt as if his heart were beating fast. Patient denies any cough or congestion he denies fever or chills.  Patient reports the pain is intermittent  The history is provided by the patient. No language interpreter was used.  Chest Pain Pain location:  L chest Pain quality: aching   Pain radiates to:  Does not radiate Pain severity:  No pain Timing:  Intermittent Progression:  Worsening Chronicity:  New Relieved by:  Nothing      Prior to Admission medications   Medication Sig Start Date End Date Taking? Authorizing Provider  ACCU-CHEK SOFTCLIX LANCETS lancets Use as instructed for three times daily testing of blood glucose 09/06/16   Jegede, Olugbemiga E, MD  acetaminophen  (TYLENOL ) 500 MG tablet Take 1,000 mg by mouth every 6 (six) hours as needed (pain).    [provider]  albuterol  (VENTOLIN  HFA) 108 (90 Base) MCG/ACT inhaler Inhale 1-2 puffs into the lungs every 6 (six) hours as needed for wheezing or shortness of breath. 04/06/20   Babara, Amy V, PA-C  amiodarone  (PACERONE ) 200 MG tablet Take 1 tablet (200 mg total) by mouth 2 (two) times daily for 10 days, THEN 1 tablet (200 mg total) daily. Patient not taking: No sig reported 09/10/23 09/08/24  Rolan Ezra RAMAN, MD  atorvastatin  (LIPITOR ) 80 MG tablet TAKE 1 TABLET BY MOUTH ONCE DAILY 04/10/24   Bensimhon, Toribio SAUNDERS, MD  Blood Glucose Monitoring  Suppl DEVI 1 each by Does not apply route in the morning, at noon, and at bedtime. May substitute to any manufacturer covered by patient's insurance. 01/23/24   Mercer Clotilda SAUNDERS, MD  carvedilol  (COREG ) 12.5 MG tablet TAKE 1 TABLET BY MOUTH TWICE DAILY WITH MEALS *PATIENT NEEDS APPOINTMENT* 12/13/23   Rolan Ezra RAMAN, MD  Continuous Glucose Receiver (FREESTYLE LIBRE 3 READER) DEVI 1 Device by Does not apply route continuous. 09/05/23   Mercer Clotilda SAUNDERS, MD  Continuous Glucose Sensor (FREESTYLE LIBRE 3 SENSOR) MISC 1 Device by Does not apply route every 14 (fourteen) days. 05/19/24   Dartha Ernst, MD  ELIQUIS  5 MG TABS tablet TAKE 1 TABLET BY MOUTH TWICE DAILY 12/13/23   Rolan Ezra RAMAN, MD  eplerenone  (INSPRA ) 25 MG tablet Take 1 tablet (25 mg total) by mouth daily. 09/25/23   [name], FNP  ezetimibe  (ZETIA ) 10 MG tablet TAKE 1 TABLET BY MOUTH ONCE DAILY 10/16/23   McLean, Dalton S, MD  Glucose Blood (BLOOD GLUCOSE TEST STRIPS) STRP 1 each by In Vitro route in the morning, at noon, and at bedtime. May substitute to any manufacturer covered by patient's insurance. 01/23/24   Mercer Clotilda SAUNDERS, MD  hydrALAZINE  (APRESOLINE ) 50 MG tablet TAKE 1 TABLET BY MOUTH TWICE DAILY IN THE MORNING AND AT BEDTIME 12/27/23   Rolan Ezra RAMAN, MD  insulin  glargine (LANTUS ) 100 UNIT/ML injection Inject 0.35  mLs (35 Units total) into the skin at bedtime. 10/28/23   Mercer Clotilda SAUNDERS, MD  insulin  lispro (HUMALOG ) 100 UNIT/ML injection Inject 1 Units into the skin 3 (three) times daily with meals. Sliding scale 70-140=0 Units 141-170=1 Units 171-200=2 Units 201-230=3 Units 231-260=4 Units 261-290=5 Units 291-320=6 Units 231-350=7 Units 351-380=8 Units Over 380 10 units    [provider]  Insulin  Pen Needle (PEN NEEDLES) 32G X 4 MM MISC 1 each by Does not apply route daily. 06/08/22   Von [place], MD  isosorbide  mononitrate (IMDUR ) 120 MG 24 hr tablet TAKE 1 TABLET BY MOUTH ONCE DAILY 12/13/23   McLean, Dalton  S, MD  JARDIANCE  25 MG TABS tablet TAKE 1 TABLET BY MOUTH ONCE DAILY CONTACT OFFICE FOR APPOINTMENT 09/16/23   Dartha Ernst, MD  Lancets Misc. MISC 1 each by Does not apply route in the morning, at noon, and at bedtime. May substitute to any manufacturer covered by patient's insurance. 01/23/24   Mercer Clotilda SAUNDERS, MD  losartan  (COZAAR ) 25 MG tablet Take 1 tablet (25 mg total) by mouth daily. 05/27/24 05/27/25  [name], FNP  metFORMIN  (GLUCOPHAGE ) 1000 MG tablet TAKE 1 TABLET BY MOUTH ONCE DAILY WITH BREAKFAST *PATIENT NEEDS APPOINTMENT* 05/25/24   Motwani, Ernst, MD  nitroGLYCERIN  (NITROSTAT ) 0.4 MG SL tablet DISSOLVE 1 TABLET UNDER THE TONGUE AS NEEDED FOR CHEST PAIN EVERY 5 MINUTES UP TO 3 TIMES. IF NO RELIEF CALL 911. 07/16/23   Bensimhon, Toribio SAUNDERS, MD  oxyCODONE -acetaminophen  (PERCOCET/ROXICET) 5-325 MG tablet Take 1 tablet by mouth every 6 (six) hours as needed for severe pain (pain score 7-10). 11/24/23   Levander Houston, MD  potassium chloride  SA (KLOR-CON  M) 20 MEQ tablet TAKE 1 TABLET BY MOUTH ONCE DAILY Patient not taking: Reported on 05/27/2024 04/10/24   Bensimhon, Toribio SAUNDERS, MD  sotalol  (BETAPACE ) 120 MG tablet Take 120 mg by mouth 2 (two) times daily. Patient not taking: Reported on 05/27/2024 12/11/23   [provider]  torsemide  (DEMADEX ) 20 MG tablet TAKE 2 TABLETS BY MOUTH TWICE DAILY 12/27/23   McLean, Dalton S, MD    Allergies: Shrimp [shellfish allergy], Ace inhibitors, and Other    Review of Systems  Cardiovascular:  Positive for chest pain.  All other systems reviewed and are negative.   Updated Vital Signs BP (!) 185/106   Pulse 73   Temp 97.6 F (36.4 C) (Oral)   Resp (!) 27   Ht 6' (1.829 m)   Wt 88.9 kg   SpO2 96%   BMI 26.58 kg/m   Physical Exam Vitals reviewed.  Constitutional:      Appearance: He is well-developed.  Eyes:     Extraocular Movements: Extraocular movements intact.     Pupils: Pupils are equal, round, and reactive to light.   Cardiovascular:     Rate and Rhythm: Normal rate and regular rhythm.     Heart sounds: Normal heart sounds.  Pulmonary:     Effort: Pulmonary effort is normal.     Breath sounds: Normal breath sounds.  Abdominal:     General: Bowel sounds are normal.     Palpations: Abdomen is soft.  Musculoskeletal:        General: Normal range of motion.     Cervical back: Normal range of motion.  Skin:    General: Skin is warm.  Neurological:     General: No focal deficit present.     Mental Status: He is alert.  Psychiatric:  Mood and Affect: Mood normal.     (all labs ordered are listed, but only abnormal results are displayed) Labs Reviewed  CBC WITH DIFFERENTIAL/PLATELET - Abnormal; Notable for the following components:      Result Value   RDW 15.6 (*)    All other components within normal limits  COMPREHENSIVE METABOLIC PANEL WITH GFR - Abnormal; Notable for the following components:   Sodium 134 (*)    Potassium 3.3 (*)    Glucose, Bld 234 (*)    Calcium  8.3 (*)    Albumin 3.0 (*)    Total Bilirubin 1.6 (*)    All other components within normal limits  BRAIN NATRIURETIC PEPTIDE - Abnormal; Notable for the following components:   B Natriuretic Peptide 321.4 (*)    All other components within normal limits  TROPONIN I (HIGH SENSITIVITY) - Abnormal; Notable for the following components:   Troponin I (High Sensitivity) 81 (*)    All other components within normal limits  TROPONIN I (HIGH SENSITIVITY) - Abnormal; Notable for the following components:   Troponin I (High Sensitivity) 75 (*)    All other components within normal limits    EKG: EKG Interpretation Date/Time:  Wednesday June 03 2024 09:26:32 EDT Ventricular Rate:  64 PR Interval:    QRS Duration:  101 QT Interval:  467 QTC Calculation: 482 R Axis:   56  Text Interpretation: Atrial fibrillation Multiple ventricular premature complexes Left ventricular hypertrophy Borderline prolonged QT interval  Confirmed by Jerrol Agent ([id number]) on 06/03/2024 9:37:10 AM  Radiology: [facility] Chest Port 1 View Result Date: 06/03/2024 CLINICAL DATA:  Left-sided chest pain and shortness of breath since last night. EXAM: PORTABLE CHEST 1 VIEW COMPARISON:  01/02/2024 FINDINGS: Lungs are adequately inflated demonstrate bilateral minimal hazy prominence of the central pulmonary vessels suggesting mild vascular congestion. No lobar consolidation or significant effusion. Stable borderline cardiomegaly. Remainder of the exam is unchanged. IMPRESSION: Borderline cardiomegaly with suggestion of mild vascular congestion. Electronically Signed   By: Toribio Agreste M.D.   On: 06/03/2024 10:16     Procedures   Medications Ordered in the ED  furosemide  (LASIX ) injection 20 mg (has no administration in time range)  nitroGLYCERIN  50 mg in dextrose  5 % 250 mL (0.2 mg/mL) infusion (has no administration in time range)                                    Medical Decision Making Pt complains of chest discomfort and shortness of breath.    Amount and/or Complexity of Data Reviewed Labs: ordered. Decision-making details documented in ED Course.    Details: Labs ordered reviewed and interpreted Troponin is elevated x 2.  BNP is 321.  Radiology: ordered and independent interpretation performed. Decision-making details documented in ED Course.    Details: Chest xray  mild pulmonary edema ECG/medicine tests: ordered and independent interpretation performed. Decision-making details documented in ED Course.    Details: EKG atrial fibrillation  Discussion of management or test interpretation with external provider(s): I discussed with Dr. Georgina hospitalist who will see patient for admission.  Risk Prescription drug management. Decision regarding hospitalization. Risk Details: Pt started on nitroglycerin  drip.         Final diagnoses:  Hypertensive crisis  Nonspecific chest pain  Congestive heart failure, unspecified HF  chronicity, unspecified heart failure type [facility])    ED Discharge Orders     None  [place] Sonny POUR, [place] 06/03/24 1552    Jerrol Agent, MD 06/08/24 [phone]

## 2024-06-03 NOTE — H&P (Signed)
 History and Physical    Patient: [name] [name] FMW:[id number] DOB: 07-10-1956 DOA: 06/03/2024 DOS: the patient was seen and examined on 06/03/2024 PCP: Mercer Clotilda SAUNDERS, MD  Patient coming from: Home  Chief Complaint:  Chief Complaint  Patient presents with   Chest Pain   HPI: [name] [name] is a 68 y.o. male with medical history significant of hypertension, atrial fibrillation on apixaban , hyperlipidemia, poorly controlled type 2 diabetes, CKD (baseline creatinine appears to be 1.4-1.6), and recent admission in 08/2023 for expressive aphasia 2/2 TIA/HTN emergency p/w hypertensive emergency.  The patient reported experiencing shortness of breath and inability to sleep the previous night, stating they were up all night fighting for air. The patient mentioned attempting to alleviate the symptoms by sitting outside on the porch multiple times but was unable to obtain relief. The patient decided to seek medical help in the morning and called their daughter around eight o'clock. The patient indicated missing several medications but was unsure of which ones or how many days they had missed.  In the ED, pt hypertensive and tachypneic on RA. Labs notable for  K 3.3, BNP 321, and troponin 81-->75. EDP started IV lasix  20mg  x1 and nitroglycerin  gtt and requested medicine admission.   Review of Systems: As mentioned in the history of present illness. All other systems reviewed and are negative. Past Medical History:  Diagnosis Date   Anxiety    CAD (coronary artery disease), native coronary artery    a. Nonobstructive CAD by cath 2013 - diffuse distal and branch vessel CAD, no severe disease in the major coronaries, LV mild global hypokinesis, EF 45%. b. ETT-Sestamibi 5/14: EF 31%, small fixed inferior defect with no ischemia.   Chronic CHF (HCC)    a. Mixed ICM/NICM (?EtOH). EF 35% in 2008. Echo 5/13: EF 60-65%, mod LVH, EF 45% on V gram in 12/2011. EF 12/2012: EF 50-55%, mild LVH,  inferobasal HK, mild MR. ETT-Ses 5/14 EF 41%. Cardiac MRI 5/14: EF 44%, mild global HK, subepicardial delayed enhancement in nonspecific RV insertion pattern.   COLONIC POLYPS, HX OF 12/30/2006   Gout    H/O atrial flutter 08/13/2005   a. Ablations in 2007, 2008.   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   PAF (paroxysmal atrial fibrillation) (HCC)    On coumadin    Sleep apnea    suppose to send mask but they never did (05/03/2015)   Type II diabetes mellitus (HCC)    Past Surgical History:  Procedure Laterality Date   A FLUTTER ABLATION  2007, 2008   catheter ablation    CARDIAC CATHETERIZATION N/A 05/03/2015   Procedure: Right/Left Heart Cath and Coronary Angiography;  Surgeon: Ezra GORMAN Shuck, MD;  Location: [facility] INVASIVE CV LAB;  Service: Cardiovascular;  Laterality: N/A;   CARDIAC CATHETERIZATION N/A 05/03/2015   Procedure: Coronary Stent Intervention;  Surgeon: Candyce GORMAN Reek, MD;  Location: [facility] INVASIVE CV LAB;  Service: Cardiovascular;  Laterality: N/A;   CARDIAC CATHETERIZATION N/A 11/04/2015   Procedure: Left Heart Cath and Coronary Angiography;  Surgeon: Ezra GORMAN Shuck, MD;  Location: [facility] INVASIVE CV  LAB;  Service: Cardiovascular;  Laterality: N/A;   CARDIOVERSION N/A 08/24/2015   Procedure: CARDIOVERSION;  Surgeon: Aleene JINNY Passe, MD;  Location: [facility] ENDOSCOPY;  Service: Cardiovascular;  Laterality: N/A;   CARDIOVERSION N/A 12/14/2022   Procedure: CARDIOVERSION;  Surgeon: Rolan Ezra RAMAN, MD;  Location: [facility] INVASIVE CV LAB;  Service: Cardiovascular;  Laterality: N/A;   CARDIOVERSION N/A 10/02/2023   Procedure:  CARDIOVERSION;  Surgeon: Rolan Ezra RAMAN, MD;  Location: [facility] INVASIVE CV LAB;  Service: Cardiovascular;  Laterality: N/A;   CHOLECYSTECTOMY N/A 02/07/2016   Procedure: LAPAROSCOPIC CHOLECYSTECTOMY WITH  INTRAOPERATIVE CHOLANGIOGRAM;  Surgeon: Camellia Blush, MD;  Location: [facility] ORS;  Service: General;  Laterality: N/A;   COLONOSCOPY WITH PROPOFOL  N/A 04/28/2019   Procedure: COLONOSCOPY WITH PROPOFOL ;  Surgeon: Saintclair Jasper, MD;  Location: [facility] ENDOSCOPY;  Service: Gastroenterology;  Laterality: N/A;   CORONARY ANGIOPLASTY  12/05/01   ESOPHAGOGASTRODUODENOSCOPY (EGD) WITH PROPOFOL  N/A 04/27/2019   Procedure: ESOPHAGOGASTRODUODENOSCOPY (EGD) WITH PROPOFOL ;  Surgeon: Saintclair Jasper, MD;  Location: [facility] ENDOSCOPY;  Service: Gastroenterology;  Laterality: N/A;   GIVENS CAPSULE STUDY N/A 06/29/2019   Procedure: GIVENS CAPSULE STUDY;  Surgeon: Dianna Specking, MD;  Location: [facility] ENDOSCOPY;  Service: Endoscopy;  Laterality: N/A;   LEFT HEART CATH AND CORONARY ANGIOGRAPHY N/A 12/28/2016   Procedure: Left Heart Cath and Coronary Angiography;  Surgeon: Rolan Ezra RAMAN, MD;  Location: [facility] INVASIVE CV LAB;  Service: Cardiovascular;  Laterality: N/A;   LEFT HEART CATHETERIZATION WITH CORONARY ANGIOGRAM N/A 12/19/2011   Procedure: LEFT HEART CATHETERIZATION WITH CORONARY ANGIOGRAM;  Surgeon: Ezra RAMAN Rolan, MD;  Location: [facility] CATH LAB;  Service: Cardiovascular;  Laterality: N/A;   OSTEOCHONDROMA EXCISION Left 1972   took bone tumor off my shoulder   POLYPECTOMY  04/28/2019   Procedure: POLYPECTOMY;  Surgeon: Saintclair Jasper, MD;  Location: [facility] ENDOSCOPY;  Service: Gastroenterology;;   PRESSURE SENSOR/CARDIOMEMS N/A 02/01/2020   Procedure: PRESSURE SENSOR/CARDIOMEMS;  Surgeon: Rolan Ezra RAMAN, MD;  Location: [facility] INVASIVE CV LAB;  Service: Cardiovascular;  Laterality: N/A;   RIGHT HEART CATH N/A 02/01/2020   Procedure: RIGHT HEART CATH;  Surgeon: Rolan Ezra RAMAN, MD;  Location: [facility] INVASIVE CV LAB;  Service: Cardiovascular;  Laterality:  N/A;   RIGHT/LEFT HEART CATH AND CORONARY ANGIOGRAPHY N/A 07/01/2019   Procedure: RIGHT/LEFT HEART CATH AND CORONARY ANGIOGRAPHY;  Surgeon: Rolan Ezra RAMAN, MD;  Location: [facility] INVASIVE CV LAB;  Service: Cardiovascular;  Laterality: N/A;   TEE WITHOUT CARDIOVERSION N/A 12/14/2022   Procedure: TRANSESOPHAGEAL ECHOCARDIOGRAM;  Surgeon: Rolan Ezra RAMAN, MD;  Location: [facility] INVASIVE CV LAB;  Service: Cardiovascular;  Laterality: N/A;   TRANSESOPHAGEAL ECHOCARDIOGRAM (CATH LAB) N/A 10/02/2023   Procedure: TRANSESOPHAGEAL ECHOCARDIOGRAM;  Surgeon: Rolan Ezra RAMAN, MD;  Location: [facility] INVASIVE CV LAB;  Service: Cardiovascular;  Laterality: N/A;   Social History:  reports that he quit smoking about 40 years ago. His smoking use included cigarettes. He started smoking about 58 years ago. He has a 72 pack-year smoking history. He has never used smokeless tobacco. He reports current alcohol use of about 16.0 standard drinks of alcohol per week. He reports current drug use. Drug: Marijuana.  Allergies  Allergen Reactions   Shrimp [Shellfish Allergy] Nausea And Vomiting   Ace Inhibitors Cough   Other Hives and Other (See Comments)    Patient reports developing hives after receiving some antibiotic given in 1980''s at [facility]. He does not know which antibiotic.    Family History  Problem Relation Age of Onset   Diabetes Mother  Hypertension Mother    Heart attack Neg Hx    Stroke Neg Hx     Prior to Admission medications   Medication Sig Start Date End Date Taking? Authorizing Provider  ACCU-CHEK SOFTCLIX LANCETS lancets Use as instructed for three times daily testing of blood glucose 09/06/16   Jegede, Olugbemiga E, MD  acetaminophen  (TYLENOL ) 500 MG tablet Take 1,000 mg by mouth every 6 (six) hours as needed (pain).    [provider]  albuterol  (VENTOLIN  HFA) 108 (90 Base) MCG/ACT inhaler Inhale 1-2 puffs into the lungs every 6 (six) hours as needed for wheezing or shortness of  breath. 04/06/20   Babara, Amy V, PA-C  amiodarone  (PACERONE ) 200 MG tablet Take 1 tablet (200 mg total) by mouth 2 (two) times daily for 10 days, THEN 1 tablet (200 mg total) daily. Patient not taking: No sig reported 09/10/23 09/08/24  Rolan Ezra RAMAN, MD  atorvastatin  (LIPITOR ) 80 MG tablet TAKE 1 TABLET BY MOUTH ONCE DAILY 04/10/24   Bensimhon, Toribio SAUNDERS, MD  Blood Glucose Monitoring Suppl DEVI 1 each by Does not apply route in the morning, at noon, and at bedtime. May substitute to any manufacturer covered by patient's insurance. 01/23/24   Mercer Clotilda SAUNDERS, MD  carvedilol  (COREG ) 12.5 MG tablet TAKE 1 TABLET BY MOUTH TWICE DAILY WITH MEALS *PATIENT NEEDS APPOINTMENT* 12/13/23   Rolan Ezra RAMAN, MD  Continuous Glucose Receiver (FREESTYLE LIBRE 3 READER) DEVI 1 Device by Does not apply route continuous. 09/05/23   Mercer Clotilda SAUNDERS, MD  Continuous Glucose Sensor (FREESTYLE LIBRE 3 SENSOR) MISC 1 Device by Does not apply route every 14 (fourteen) days. 05/19/24   Dartha Ernst, MD  ELIQUIS  5 MG TABS tablet TAKE 1 TABLET BY MOUTH TWICE DAILY 12/13/23   McLean, Dalton S, MD  eplerenone  (INSPRA ) 25 MG tablet Take 1 tablet (25 mg total) by mouth daily. 09/25/23   [name], FNP  ezetimibe  (ZETIA ) 10 MG tablet TAKE 1 TABLET BY MOUTH ONCE DAILY 10/16/23   McLean, Dalton S, MD  Glucose Blood (BLOOD GLUCOSE TEST STRIPS) STRP 1 each by In Vitro route in the morning, at noon, and at bedtime. May substitute to any manufacturer covered by patient's insurance. 01/23/24   Mercer Clotilda SAUNDERS, MD  hydrALAZINE  (APRESOLINE ) 50 MG tablet TAKE 1 TABLET BY MOUTH TWICE DAILY IN THE MORNING AND AT BEDTIME 12/27/23   Rolan Ezra RAMAN, MD  insulin  glargine (LANTUS ) 100 UNIT/ML injection Inject 0.35 mLs (35 Units total) into the skin at bedtime. 10/28/23   Mercer Clotilda SAUNDERS, MD  insulin  lispro (HUMALOG ) 100 UNIT/ML injection Inject 1 Units into the skin 3 (three) times daily with meals. Sliding scale 70-140=0 Units 141-170=1  Units 171-200=2 Units 201-230=3 Units 231-260=4 Units 261-290=5 Units 291-320=6 Units 231-350=7 Units 351-380=8 Units Over 380 10 units    [provider]  Insulin  Pen Needle (PEN NEEDLES) 32G X 4 MM MISC 1 each by Does not apply route daily. 06/08/22   [place]on [place], MD  isosorbide  mononitrate (IMDUR ) 120 MG 24 hr tablet TAKE 1 TABLET BY MOUTH ONCE DAILY 12/13/23   McLean, Dalton S, MD  JARDIANCE  25 MG TABS tablet TAKE 1 TABLET BY MOUTH ONCE DAILY CONTACT OFFICE FOR APPOINTMENT 09/16/23   Dartha Ernst, MD  Lancets Misc. MISC 1 each by Does not apply route in the morning, at noon, and at bedtime. May substitute to any manufacturer covered by patient's insurance. 01/23/24   Mercer Clotilda SAUNDERS, MD  losartan  (COZAAR ) 25  MG tablet Take 1 tablet (25 mg total) by mouth daily. 05/27/24 05/27/25  Milford, Harlene HERO, FNP  metFORMIN  (GLUCOPHAGE ) 1000 MG tablet TAKE 1 TABLET BY MOUTH ONCE DAILY WITH BREAKFAST *PATIENT NEEDS APPOINTMENT* 05/25/24   Motwani, Obadiah, MD  nitroGLYCERIN  (NITROSTAT ) 0.4 MG SL tablet DISSOLVE 1 TABLET UNDER THE TONGUE AS NEEDED FOR CHEST PAIN EVERY 5 MINUTES UP TO 3 TIMES. IF NO RELIEF CALL 911. 07/16/23   Bensimhon, Toribio SAUNDERS, MD  oxyCODONE -acetaminophen  (PERCOCET/ROXICET) 5-325 MG tablet Take 1 tablet by mouth every 6 (six) hours as needed for severe pain (pain score 7-10). 11/24/23   Levander Houston, MD  potassium chloride  SA (KLOR-CON  M) 20 MEQ tablet TAKE 1 TABLET BY MOUTH ONCE DAILY Patient not taking: Reported on 05/27/2024 04/10/24   Bensimhon, Toribio SAUNDERS, MD  sotalol  (BETAPACE ) 120 MG tablet Take 120 mg by mouth 2 (two) times daily. Patient not taking: Reported on 05/27/2024 12/11/23   [provider]  torsemide  (DEMADEX ) 20 MG tablet TAKE 2 TABLETS BY MOUTH TWICE DAILY 12/27/23   Rolan Ezra RAMAN, MD    Physical Exam: Vitals:   06/03/24 1315 06/03/24 1330 06/03/24 1415 06/03/24 1545  BP: (!) 217/125 (!) 189/123 (!) 180/118 (!) 147/81  Pulse: (!) 34 83 82 70   Resp: (!) 25 (!) 22  (!) 21  Temp:      TempSrc:      SpO2: 98% 95% 94% 97%  Weight:      Height:       General: Alert, oriented x3, resting comfortably in no acute distress HEENT: EOMI, oropharynx clear, moist mucous membranes, hearing intact Neck: Trachea midline and no gross thyromegaly Respiratory: Lungs clear to auscultation bilaterally with normal respiratory effort; no w/r/r Cardiovascular: Regular rate and rhythm w/o m/r/g Abdomen: Soft, nontender, nondistended. Positive bowel sounds MSK: No obvious joint deformities or swelling Skin: No obvious rashes or lesions Neurologic: Awake, alert, spontaneously moves all extremities, strength intact Psychiatric: Appropriate mood and affect, conversational and cooperative   Data Reviewed:  Lab Results  Component Value Date   WBC 6.5 06/03/2024   HGB 16.1 06/03/2024   HCT 49.4 06/03/2024   MCV 88.8 06/03/2024   PLT 175 06/03/2024   Lab Results  Component Value Date   GLUCOSE 234 (H) 06/03/2024   CALCIUM  8.3 (L) 06/03/2024   NA 134 (L) 06/03/2024   K 3.3 (L) 06/03/2024   CO2 22 06/03/2024   CL 105 06/03/2024   BUN 8 06/03/2024   CREATININE 1.14 06/03/2024   Lab Results  Component Value Date   ALT 16 06/03/2024   AST 22 06/03/2024   ALKPHOS 65 06/03/2024   BILITOT 1.6 (H) 06/03/2024   Lab Results  Component Value Date   INR 1.1 11/24/2023   INR 1.2 04/29/2021   INR 1.2 04/24/2019   PROTIME 14.5 01/24/2009   Radiology: DG Chest Port 1 View Result Date: 06/03/2024 CLINICAL DATA:  Left-sided chest pain and shortness of breath since last night. EXAM: PORTABLE CHEST 1 VIEW COMPARISON:  01/02/2024 FINDINGS: Lungs are adequately inflated demonstrate bilateral minimal hazy prominence of the central pulmonary vessels suggesting mild vascular congestion. No lobar consolidation or significant effusion. Stable borderline cardiomegaly. Remainder of the exam is unchanged. IMPRESSION: Borderline cardiomegaly with suggestion of  mild vascular congestion. Electronically Signed   By: Toribio Agreste M.D.   On: 06/03/2024 10:16    Assessment and Plan: 31M h/o hypertension, atrial fibrillation on apixaban , hyperlipidemia, poorly controlled type 2 diabetes, CKD (baseline Cr 1.4-1.6), and  recent admission in 08/2023 for expressive aphasia 2/2 TIA/HTN emergency p/w hypertensive emergency.  Hypertensive emergency -Admit to PCU for close observation -Continue nitroglycerin  gtt for now; titrate per protocol -Goal BP w/ 25% reduction in 24h and normotension tomorrow; goal SBP 160 today  Afib -PTA Eliquis  BID -Resume pta Coreg  12.5mg  BID   HFpEF -PTA atorvastatin , Coreg , and torsemide  40mg  BID -Will need med rec for GDMT per below  HTN -PTA Coreg  and IV nitroglycerin  per above -HOLD pta Imdur  for now; resume tomorrow; will need med recs (pt previously on sotalol , losartan , amiodarone , and toresmide; unclear what meds pt currently taking)  DM2 -Semglee  30U nightly and SSI TID AC prn -Mod Carb diet   Advance Care Planning:   Code Status: Full Code   Consults: N/A  Family Communication: N/A  Severity of Illness: The appropriate patient status for this patient is INPATIENT. Inpatient status is judged to be reasonable and necessary in order to provide the required intensity of service to ensure the patient's safety. The patient's presenting symptoms, physical exam findings, and initial radiographic and laboratory data in the context of their chronic comorbidities is felt to place them at high risk for further clinical deterioration. Furthermore, it is not anticipated that the patient will be medically stable for discharge from the hospital within 2 midnights of admission.   * I certify that at the point of admission it is my clinical judgment that the patient will require inpatient hospital care spanning beyond 2 midnights from the point of admission due to high intensity of service, high risk for further deterioration and  high frequency of surveillance required.*   ------- I spent 60 minutes reviewing previous notes, at the bedside counseling/discussing the treatment plan, and performing clinical documentation.  Author: Marsha Ada, MD 06/03/2024 3:57 PM  For on call review [email or web address].

## 2024-06-04 ENCOUNTER — Ambulatory Visit (HOSPITAL_COMMUNITY): Admitting: Internal Medicine

## 2024-06-04 ENCOUNTER — Inpatient Hospital Stay (HOSPITAL_COMMUNITY)

## 2024-06-04 ENCOUNTER — Encounter (HOSPITAL_COMMUNITY): Payer: Self-pay | Admitting: Hospitalist

## 2024-06-04 DIAGNOSIS — R7989 Other specified abnormal findings of blood chemistry: Secondary | ICD-10-CM

## 2024-06-04 DIAGNOSIS — I6782 Cerebral ischemia: Secondary | ICD-10-CM | POA: Diagnosis not present

## 2024-06-04 DIAGNOSIS — I5032 Chronic diastolic (congestive) heart failure: Secondary | ICD-10-CM

## 2024-06-04 DIAGNOSIS — I2510 Atherosclerotic heart disease of native coronary artery without angina pectoris: Secondary | ICD-10-CM

## 2024-06-04 DIAGNOSIS — I161 Hypertensive emergency: Secondary | ICD-10-CM | POA: Diagnosis not present

## 2024-06-04 DIAGNOSIS — R519 Headache, unspecified: Secondary | ICD-10-CM | POA: Diagnosis not present

## 2024-06-04 DIAGNOSIS — I4892 Unspecified atrial flutter: Secondary | ICD-10-CM

## 2024-06-04 LAB — COMPREHENSIVE METABOLIC PANEL WITH GFR
ALT: 14 U/L (ref 0–44)
AST: 15 U/L (ref 15–41)
Albumin: 3 g/dL — ABNORMAL LOW (ref 3.5–5.0)
Alkaline Phosphatase: 60 U/L (ref 38–126)
Anion gap: 11 (ref 5–15)
BUN: 9 mg/dL (ref 8–23)
CO2: 28 mmol/L (ref 22–32)
Calcium: 8.9 mg/dL (ref 8.9–10.3)
Chloride: 98 mmol/L (ref 98–111)
Creatinine, Ser: 1.31 mg/dL — ABNORMAL HIGH (ref 0.61–1.24)
GFR, Estimated: 59 mL/min — ABNORMAL LOW (ref >60)
Glucose, Bld: 234 mg/dL — ABNORMAL HIGH (ref 70–99)
Potassium: 3.1 mmol/L — ABNORMAL LOW (ref 3.5–5.1)
Sodium: 137 mmol/L (ref 135–145)
Total Bilirubin: 1.9 mg/dL — ABNORMAL HIGH (ref 0.0–1.2)
Total Protein: 7.1 g/dL (ref 6.5–8.1)

## 2024-06-04 LAB — GLUCOSE, CAPILLARY
Glucose-Capillary: 193 mg/dL — ABNORMAL HIGH (ref 70–99)
Glucose-Capillary: 349 mg/dL — ABNORMAL HIGH (ref 70–99)
Glucose-Capillary: 103 mg/dL — ABNORMAL HIGH (ref 70–99)
Glucose-Capillary: 223 mg/dL — ABNORMAL HIGH (ref 70–99)

## 2024-06-04 LAB — CBC
HCT: 48.8 % (ref 39.0–52.0)
Hemoglobin: 16.3 g/dL (ref 13.0–17.0)
MCH: 29.3 pg (ref 26.0–34.0)
MCHC: 33.4 g/dL (ref 30.0–36.0)
MCV: 87.8 fL (ref 80.0–100.0)
Platelets: 186 K/uL (ref 150–400)
RBC: 5.56 MIL/uL (ref 4.22–5.81)
RDW: 15.3 % (ref 11.5–15.5)
WBC: 6.1 K/uL (ref 4.0–10.5)
nRBC: 0 % (ref 0.0–0.2)

## 2024-06-04 LAB — HEMOGLOBIN A1C
Hgb A1c MFr Bld: 8.4 % — ABNORMAL HIGH (ref 4.8–5.6)
Mean Plasma Glucose: 194.38 mg/dL

## 2024-06-04 MED ORDER — ISOSORBIDE MONONITRATE ER 60 MG PO TB24
120.0000 mg | ORAL_TABLET | Freq: Every day | ORAL | Status: DC
  Administered 2024-06-04 – 2024-06-08 (×5): 120 mg via ORAL
  Filled 2024-06-04 (×5): qty 2

## 2024-06-04 MED ORDER — POTASSIUM CHLORIDE CRYS ER 20 MEQ PO TBCR
40.0000 meq | EXTENDED_RELEASE_TABLET | Freq: Once | ORAL | Status: AC
  Administered 2024-06-04: 40 meq via ORAL
  Filled 2024-06-04: qty 2

## 2024-06-04 MED ORDER — LORAZEPAM 2 MG/ML IJ SOLN: 1.0000 mg | INTRAMUSCULAR | Status: AC | PRN

## 2024-06-04 MED ORDER — EPLERENONE 25 MG PO TABS
25.0000 mg | ORAL_TABLET | Freq: Every day | ORAL | Status: DC
  Administered 2024-06-04 – 2024-06-08 (×5): 25 mg via ORAL
  Filled 2024-06-04 (×5): qty 1

## 2024-06-04 MED ORDER — HYDRALAZINE HCL 50 MG PO TABS
50.0000 mg | ORAL_TABLET | Freq: Two times a day (BID) | ORAL | Status: DC
  Administered 2024-06-04: 50 mg via ORAL
  Filled 2024-06-04: qty 1

## 2024-06-04 MED ORDER — INSULIN ASPART 100 UNIT/ML IJ SOLN
0.0000 [IU] | Freq: Every day | INTRAMUSCULAR | Status: DC
  Administered 2024-06-04 – 2024-06-05 (×2): 2 [IU] via SUBCUTANEOUS

## 2024-06-04 MED ORDER — LOSARTAN POTASSIUM 25 MG PO TABS: 25.0000 mg | ORAL_TABLET | Freq: Every day | ORAL | Status: DC

## 2024-06-04 MED ORDER — TORSEMIDE 20 MG PO TABS
40.0000 mg | ORAL_TABLET | Freq: Every day | ORAL | Status: DC
  Administered 2024-06-05: 40 mg via ORAL
  Filled 2024-06-04: qty 2

## 2024-06-04 MED ORDER — IBUPROFEN 200 MG PO TABS
600.0000 mg | ORAL_TABLET | Freq: Four times a day (QID) | ORAL | Status: DC | PRN
  Administered 2024-06-04 – 2024-06-05 (×2): 600 mg via ORAL
  Filled 2024-06-04 (×3): qty 3

## 2024-06-04 MED ORDER — AMLODIPINE BESYLATE 5 MG PO TABS
5.0000 mg | ORAL_TABLET | Freq: Every day | ORAL | Status: DC
  Administered 2024-06-05: 5 mg via ORAL
  Filled 2024-06-04: qty 1

## 2024-06-04 MED ORDER — INSULIN ASPART 100 UNIT/ML IJ SOLN
0.0000 [IU] | Freq: Three times a day (TID) | INTRAMUSCULAR | Status: DC
  Administered 2024-06-04: 7 [IU] via SUBCUTANEOUS
  Administered 2024-06-05 – 2024-06-06 (×2): 1 [IU] via SUBCUTANEOUS
  Administered 2024-06-07 (×2): 2 [IU] via SUBCUTANEOUS
  Administered 2024-06-08: 1 [IU] via SUBCUTANEOUS
  Administered 2024-06-08: 2 [IU] via SUBCUTANEOUS

## 2024-06-04 MED ORDER — THIAMINE MONONITRATE 100 MG PO TABS
100.0000 mg | ORAL_TABLET | Freq: Every day | ORAL | Status: DC
  Administered 2024-06-04 – 2024-06-06 (×3): 100 mg via ORAL
  Filled 2024-06-04 (×3): qty 1

## 2024-06-04 MED ORDER — FOLIC ACID 1 MG PO TABS
1.0000 mg | ORAL_TABLET | Freq: Every day | ORAL | Status: DC
  Administered 2024-06-04 – 2024-06-08 (×5): 1 mg via ORAL
  Filled 2024-06-04 (×5): qty 1

## 2024-06-04 MED ORDER — THIAMINE HCL 100 MG/ML IJ SOLN: 100.0000 mg | Freq: Every day | INTRAMUSCULAR | Status: DC

## 2024-06-04 MED ORDER — LORAZEPAM 1 MG PO TABS
1.0000 mg | ORAL_TABLET | ORAL | Status: AC | PRN
  Administered 2024-06-04: 1 mg via ORAL
  Filled 2024-06-04: qty 1

## 2024-06-04 MED ORDER — LOSARTAN POTASSIUM 25 MG PO TABS
25.0000 mg | ORAL_TABLET | Freq: Every day | ORAL | Status: DC
  Administered 2024-06-04 – 2024-06-08 (×5): 25 mg via ORAL
  Filled 2024-06-04 (×5): qty 1

## 2024-06-04 MED ORDER — EMPAGLIFLOZIN 25 MG PO TABS
25.0000 mg | ORAL_TABLET | Freq: Every day | ORAL | Status: DC
  Administered 2024-06-04 – 2024-06-08 (×5): 25 mg via ORAL
  Filled 2024-06-04 (×5): qty 1

## 2024-06-04 MED ORDER — ADULT MULTIVITAMIN W/MINERALS CH
1.0000 | ORAL_TABLET | Freq: Every day | ORAL | Status: DC
  Administered 2024-06-04 – 2024-06-08 (×5): 1 via ORAL
  Filled 2024-06-04 (×5): qty 1

## 2024-06-04 NOTE — Progress Notes (Signed)
 Heart Failure Navigator Progress Note  Assessed for Heart & Vascular TOC clinic readiness.  Patient does not meet criteria due to established with AHF clinic - patient of Dr. Rolan.   Will sign off.  Duwaine Plant, PharmD, BCPS Heart Failure Stewardship Pharmacist Phone [phone]

## 2024-06-04 NOTE — Plan of Care (Signed)
  Problem: Education: Goal: Knowledge of General Education information will improve Description: Including pain rating scale, medication(s)/side effects and non-pharmacologic comfort measures Outcome: Progressing   Problem: Health Behavior/Discharge Planning: Goal: Ability to manage health-related needs will improve Outcome: Progressing   Problem: Clinical Measurements: Goal: Ability to maintain clinical measurements within normal limits will improve Outcome: Progressing Goal: Cardiovascular complication will be avoided Outcome: Progressing   Problem: Pain Managment: Goal: General experience of comfort will improve and/or be controlled Outcome: Progressing   Problem: Safety: Goal: Ability to remain free from injury will improve Outcome: Progressing   Problem: Health Behavior/Discharge Planning: Goal: Ability to manage health-related needs will improve Outcome: Progressing

## 2024-06-04 NOTE — Consult Note (Addendum)
 Cardiology Consultation   Patient ID: [name] [name] MRN: [id number]; DOB: 1956-03-19  Admit date: 06/03/2024 Date of Consult: 06/04/2024  PCP:  [name] Clotilda SAUNDERS, MD   [facility] Providers Cardiologist:  [name] Shuck, MD     Patient Profile: [name] [name] is a 68 y.o. male with a hx of hypertension, diabetes, PAF, CAD, CHF, poorly controlled type 2 diabetes, CKD, history of GI who is being seen 06/04/2024 for the evaluation of hypertensive emergency at the request of Dr. Mosie.  History of Present Illness: [name] [name] is a 68 year old male with above medical history.  Per chart review, patient had previously been admitted in 12/2011 with chest pain.  Cardiac cath at that time showed severe distal OM branch vessel disease.  No interventional options.  Echo at that time showed EF 60 to 65%.  He had a cardiac MRI in 01/2013 that showed normal LV size with mild LVH, EF 44%, normal RV size and systolic function, small areas of subepicardial delayed enhancement at the mid anteroseptal and mid inferoseptal RV insertion sites.  This was nonspecific finding.  EF later improved to 55-60% in 2016.  Right/left heart catheterization in 04/2015 showed diffuse coronary disease consistent with poorly controlled diabetes and HTN. Patient had DES placed to the distal RCA and proximal RCA.   Left heart catheterization 10/2015 showed extensive CAD but no severe/critical lesions.  Diffuse disease consistent with poorly controlled diabetes.  Stents in the RCA were patent.  Echocardiogram in 01/2016 showed EF 55-60% with no regional wall motion abnormalities, grade 2 diastolic dysfunction, mild AI.   Cardiac catheterization 12/2016 showed diffuse CAD, prior to similar study in 2017.  No targets for intervention.  There were tight stenoses of small to moderate branch vessels that were not amenable to intervention.  There was 60% mid RCA disease that was provide no source of angina.   Overall, recommended aggressive medical management. EF remained normal on echocardiogram   Right/left heart catheterization 06/2019 showed normal filling pressures and preserved cardiac output, diffuse distal and branch vessel CAD.  Again, no acute interventional targets.  His anatomy was not amenable to CABG.  Recommended medical management.  Cardiac monitor in 11/2019 showed primarily normal sinus rhythm with 20% atrial flutter burden.  Echo in 12/2019 showed EF 55%, no regional wall motion abnormalities, normal RV systolic function.  Had CardioMEMS pressure sensor placed in 01/2020.  Cardiac monitor 04/2021 showed predominantly normal sinus rhythm, no definitive atrial fibrillation noted.  CMRI in 05/2022 showed normal LV size with moderate-severe asymmetric hypertrophy of the basal to mid inferoseptal and anteroseptum. Hypokinesis of the basal to mid septum. EF 47%. No evidence for LV outflow gradient. Normal RV size, mildly decreased systolic function. Non-coronary LGE pattern, most consistent with hypertrophic cardiomyopathy. Less likely cardiac amyloidosis.   These have been admitted in 08/2023 with expressive aphasia.  He had not been taking any of his medications except for occasionally Eliquis .  It was suspected he had TIA.  Echocardiogram showed EF 60-65% with severe LVH, normal RV.  Carotid Doppler showed 40-59% left ICA stenosis.  LDL 159, hemoglobin A1c 13.2.  He was seen with advanced heart failure for hospital follow-up and he was in atrial flutter.  He was loaded with amiodarone .  Underwent TEE guided cardioversion on 09/2023. LVEF 50-55%, severe asymmetric LVH of septal segment, RV normal, no thrombus. Successful DCCV to NSR. Was back in afib in 11/2023   Seen by heart failure clinic on 05/27/2024.  At that time,  patient had shortness of breath when walking on flat ground. Was referred to paramedicine for help with medication compliance.    Patient presented to the ED on 06/03/24 for evaluation  of chest discomfort and shortness of breath.  BP significantly elevated 153/129.  Oxygen 93% on room air, heart rate 71 bpm.  The ED, BP got as elevated as 217/125.  Labs significant for high-sensitivity troponin 81>75. BNP 321.  Creatinine 1.14, K3.3, hemoglobin 16.1.  Chest x-ray showed borderline cardiomegaly with suggestion of mild vascular congestion. CT head showed no acute intracranial abnormality, moderate chronic small vessel ischemic disease.  Currently patient on Eliquis  5 mg twice daily, Lipitor  80 mg daily, carvedilol  12.5 mg twice daily, hydralazine  50 mg twice daily, IV nitroglycerin    Past Medical History:  Diagnosis Date   Anxiety    CAD (coronary artery disease), native coronary artery    a. Nonobstructive CAD by cath 2013 - diffuse distal and branch vessel CAD, no severe disease in the major coronaries, LV mild global hypokinesis, EF 45%. b. ETT-Sestamibi 5/14: EF 31%, small fixed inferior defect with no ischemia.   Chronic CHF (HCC)    a. Mixed ICM/NICM (?EtOH). EF 35% in 2008. Echo 5/13: EF 60-65%, mod LVH, EF 45% on V gram in 12/2011. EF 12/2012: EF 50-55%, mild LVH, inferobasal HK, mild MR. ETT-Ses 5/14 EF 41%. Cardiac MRI 5/14: EF 44%, mild global HK, subepicardial delayed enhancement in nonspecific RV insertion pattern.   COLONIC POLYPS, HX OF 12/30/2006   Gout    H/O atrial flutter 08/13/2005   a. Ablations in 2007, 2008.   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   PAF (paroxysmal atrial fibrillation) (HCC)    On coumadin    Sleep apnea    suppose to send mask but they never did (05/03/2015)   Type II diabetes mellitus (HCC)      Past Surgical History:  Procedure Laterality Date   A FLUTTER ABLATION  2007, 2008   catheter ablation    CARDIAC CATHETERIZATION N/A 05/03/2015   Procedure: Right/Left Heart Cath and Coronary Angiography;  Surgeon: [name] [name] Shuck, MD;  Location: [facility] INVASIVE CV LAB;  Service: Cardiovascular;  Laterality: N/A;   CARDIAC CATHETERIZATION N/A 05/03/2015   Procedure: Coronary Stent Intervention;  Surgeon: [name] [name] Reek, MD;  Location: [facility] INVASIVE CV LAB;  Service: Cardiovascular;  Laterality: N/A;   CARDIAC CATHETERIZATION N/A 11/04/2015   Procedure: Left Heart Cath and Coronary Angiography;  Surgeon: [name] [name] Shuck, MD;  Location: [facility] INVASIVE CV LAB;  Service: Cardiovascular;  Laterality: N/A;   CARDIOVERSION N/A 08/24/2015   Procedure: CARDIOVERSION;  Surgeon: [name] JINNY Passe, MD;  Location: [facility] ENDOSCOPY;  Service: Cardiovascular;  Laterality: N/A;   CARDIOVERSION N/A 12/14/2022   Procedure: CARDIOVERSION;  Surgeon: [name] [name] GORMAN, MD;  Location: [facility] INVASIVE CV LAB;  Service: Cardiovascular;  Laterality: N/A;   CARDIOVERSION N/A 10/02/2023   Procedure: CARDIOVERSION;  Surgeon: [name] [name] GORMAN, MD;  Location: [facility] INVASIVE CV LAB;  Service: Cardiovascular;  Laterality: N/A;   CHOLECYSTECTOMY N/A 02/07/2016   Procedure: LAPAROSCOPIC CHOLECYSTECTOMY WITH  INTRAOPERATIVE CHOLANGIOGRAM;  Surgeon: [name] Blush, MD;  Location: [facility] ORS;  Service: General;  Laterality: N/A;   COLONOSCOPY WITH PROPOFOL  N/A 04/28/2019   Procedure: COLONOSCOPY WITH PROPOFOL ;  Surgeon: [name] Jasper, MD;  Location: [facility] ENDOSCOPY;  Service: Gastroenterology;  Laterality: N/A;   CORONARY ANGIOPLASTY  12/05/01   ESOPHAGOGASTRODUODENOSCOPY (EGD) WITH PROPOFOL  N/A 04/27/2019   Procedure: ESOPHAGOGASTRODUODENOSCOPY (EGD) WITH PROPOFOL ;  Surgeon: [name] Jasper, MD;  Location: [facility] ENDOSCOPY;  Service: Gastroenterology;  Laterality: N/A;   GIVENS CAPSULE STUDY N/A 06/29/2019   Procedure: GIVENS CAPSULE STUDY;  Surgeon: [name] Specking,  MD;  Location: [facility] ENDOSCOPY;  Service: Endoscopy;  Laterality: N/A;   LEFT HEART CATH AND CORONARY ANGIOGRAPHY N/A 12/28/2016   Procedure: Left Heart Cath and Coronary Angiography;  Surgeon: [name] [name] RAMAN, MD;  Location: [facility] INVASIVE CV LAB;  Service: Cardiovascular;  Laterality: N/A;   LEFT HEART CATHETERIZATION WITH CORONARY ANGIOGRAM N/A 12/19/2011   Procedure: LEFT HEART CATHETERIZATION WITH CORONARY ANGIOGRAM;  Surgeon: [name] [name] Rolan, MD;  Location: [facility] CATH LAB;  Service: Cardiovascular;  Laterality: N/A;   OSTEOCHONDROMA EXCISION Left 1972   took bone tumor off my shoulder   POLYPECTOMY  04/28/2019   Procedure: POLYPECTOMY;  Surgeon: [name] Jasper, MD;  Location: [facility] ENDOSCOPY;  Service: Gastroenterology;;   PRESSURE SENSOR/CARDIOMEMS N/A 02/01/2020   Procedure: PRESSURE SENSOR/CARDIOMEMS;  Surgeon: [name] [name] RAMAN, MD;  Location: [facility] INVASIVE CV LAB;  Service: Cardiovascular;  Laterality: N/A;   RIGHT HEART CATH N/A 02/01/2020   Procedure: RIGHT HEART CATH;  Surgeon: [name] [name] RAMAN, MD;  Location: [facility] INVASIVE CV LAB;  Service: Cardiovascular;  Laterality: N/A;   RIGHT/LEFT HEART CATH AND CORONARY ANGIOGRAPHY N/A 07/01/2019   Procedure: RIGHT/LEFT HEART CATH AND CORONARY ANGIOGRAPHY;  Surgeon: [name] [name] RAMAN, MD;  Location: [facility] INVASIVE CV LAB;  Service: Cardiovascular;  Laterality: N/A;   TEE WITHOUT CARDIOVERSION N/A 12/14/2022   Procedure: TRANSESOPHAGEAL ECHOCARDIOGRAM;  Surgeon: [name] [name] RAMAN, MD;  Location: [facility] INVASIVE CV LAB;  Service: Cardiovascular;  Laterality: N/A;   TRANSESOPHAGEAL ECHOCARDIOGRAM (CATH LAB) N/A 10/02/2023   Procedure: TRANSESOPHAGEAL ECHOCARDIOGRAM;  Surgeon: [name] [name] RAMAN, MD;  Location: [facility] INVASIVE CV LAB;  Service: Cardiovascular;  Laterality: N/A;       Scheduled Meds:  apixaban   5 mg Oral BID   atorvastatin   80 mg Oral Daily   carvedilol   12.5 mg Oral BID WC   empagliflozin   25 mg Oral Daily   eplerenone   25 mg Oral Daily   folic acid   1 mg Oral  Daily   insulin  aspart  0-5 Units Subcutaneous QHS   insulin  aspart  0-9 Units Subcutaneous TID WC   insulin  glargine-yfgn  30 Units Subcutaneous QHS   isosorbide  mononitrate  120 mg Oral Daily   losartan   25 mg Oral Daily   multivitamin with minerals  1 tablet Oral Daily   thiamine   100 mg Oral Daily   Or   thiamine   100 mg Intravenous Daily   [START ON 06/05/2024] torsemide   40 mg Oral Daily   Continuous Infusions:  nitroGLYCERIN  40 mcg/min (06/04/24 1518)   PRN Meds: acetaminophen , albuterol , ibuprofen, LORazepam  **OR** LORazepam , melatonin  Allergies:    Allergies  Allergen Reactions   Shrimp [Shellfish Allergy] Nausea And Vomiting   Ace Inhibitors Cough   Other Hives and Other (See Comments)    Patient reports developing hives after receiving some antibiotic given in 1980''s at [facility]. He does not know which antibiotic.    Social History:   Social History   Socioeconomic History  Marital status: Widowed    Spouse name: Not on file   Number of children: 2   Years of education: 14   Highest education level: Some college, no degree  Occupational History    Employer: UNEMPLOYED  Tobacco Use   Smoking status: Former    Current packs/day: 0.00    Average packs/day: 4.0 packs/day for 18.0 years (72.0 ttl pk-yrs)    Types: Cigarettes    Start date: 08/13/1965    Quit date: 08/14/1983    Years since quitting: 40.8   Smokeless tobacco: Never  Vaping Use   Vaping status: Never Used  Substance and Sexual Activity   Alcohol use: Yes    Alcohol/week: 16.0 standard drinks of alcohol    Types: 6 Cans of beer, 10 Shots of liquor per week    Comment: 2-3 beers watching sports; occasional glass of liquor; drinks heavily in football season   Drug use: Yes    Types: Marijuana    Comment: twice a week   Sexual activity: Never  Other Topics Concern   Not on file  Social History Narrative   Not on file   Social Drivers of Health   Financial Resource Strain:  Low Risk  (07/23/2022)   Overall Financial Resource Strain (CARDIA)    Difficulty of Paying Living Expenses: Not hard at all  Food Insecurity: No Food Insecurity (06/03/2024)   Hunger Vital Sign    Worried About Running Out of Food in the Last Year: Never true    Ran Out of Food in the Last Year: Never true  Transportation Needs: No Transportation Needs (06/03/2024)   PRAPARE - Administrator, Civil Service (Medical): No    Lack of Transportation (Non-Medical): No  Physical Activity: Inactive (07/23/2022)   Exercise Vital Sign    Days of Exercise per Week: 0 days    Minutes of Exercise per Session: 0 min  Stress: No Stress Concern Present (07/23/2022)   [employer] of Occupational Health - Occupational Stress Questionnaire    Feeling of Stress : Not at all  Social Connections: Socially Isolated (06/03/2024)   Social Connection and Isolation Panel    Frequency of Communication with Friends and Family: More than three times a week    Frequency of Social Gatherings with Friends and Family: Three times a week    Attends Religious Services: Never    Active Member of Clubs or Organizations: No    Attends Banker Meetings: Never    Marital Status: Widowed  Intimate Partner Violence: Not At Risk (06/03/2024)   Humiliation, Afraid, Rape, and Kick questionnaire    Fear of Current or Ex-Partner: No    Emotionally Abused: No    Physically Abused: No    Sexually Abused: No    Family History:  Family History  Problem Relation Age of Onset   Diabetes Mother    Hypertension Mother    Heart attack Neg Hx    Stroke Neg Hx      ROS:  Please see the history of present illness.  All other ROS reviewed and negative.     Physical Exam/Data: Vitals:   06/04/24 1110 06/04/24 1203 06/04/24 1253 06/04/24 1353  BP:  (!) 169/93 (!) 176/109 (!) 147/79  Pulse: 65 68  65  Resp: 20     Temp: 97.6 F (36.4 C)     TempSrc: Oral     SpO2:      Weight:      Height:  Intake/Output Summary (Last 24 hours) at 06/04/2024 1548 Last data filed at 06/04/2024 0809 Gross per 24 hour  Intake 156.52 ml  Output 1525 ml  Net -1368.48 ml      06/03/2024    5:24 PM 06/03/2024    9:04 AM 05/27/2024    3:00 PM  Last 3 Weights  Weight (lbs) 188 lb 0.8 oz 196 lb 194 lb 6.4 oz  Weight (kg) 85.3 kg 88.905 kg 88.179 kg     Body mass index is 25.5 kg/m.  General:  Well nourished, well developed, in no acute distress. Laying in the bed with head elevated  HEENT: normal Neck: no JVD Vascular: Radial pulses 2+ bilaterally Cardiac:  normal S1, S2; irregular rate and rhythm  Lungs:  clear to auscultation bilaterally, no wheezing, rhonchi or rales. Normal WOB on room air  Abd: soft, nontender  Ext: no edema in BLE Musculoskeletal:  No deformities, BUE and BLE strength normal and equal Skin: warm and dry  Neuro:  CNs 2-12 intact, no focal abnormalities noted Psych:  Normal affect   EKG:  The EKG was personally reviewed and demonstrates:  Atrial flutter with variable AV block, HR 56 BPM  Telemetry:  Telemetry was personally reviewed and demonstrates:  Atrial flutter with HR in the 60s  Relevant CV Studies: Cardiac Studies & Procedures   ______________________________________________________________________________________________ CARDIAC CATHETERIZATION  CARDIAC CATHETERIZATION 02/01/2020  Conclusion 1. Mildly elevated PCWP. 2. Normal cardiac output. 3. Successful Cardiomems pressure sensor placement.   CARDIAC CATHETERIZATION  CARDIAC CATHETERIZATION 07/01/2019  Conclusion 1. Normal filling pressures and preserved cardiac output. 2. Diffuse distal and branch vessel coronary disease.  I reviewed films with Dr. Wonda, there is no good interventional target here.  He has very extensive disease but we did not think that his anatomy was amenable to CABG with the primary LAD disease being distally, continued patency of the main ramus, small LCx likely  not a good graft target, and RCA with moderate disease at most.  I think that we are left with ongoing medical management.  Findings Coronary Findings Diagnostic  Dominance: Right  Left Main Mild distal LM narrowing.  Left Anterior Descending 50% ostial LAD stenosis.  Diffuse 30-40% stenoses throughout the proximal and mid LAD.  The distal LAD is diffusely disease culminating in an area of 95% stenosis far distally.  Ramus Intermedius Large vessel.  40% proximal and 40% mid ramus stenosis. Small 1st branch off ramus with 80% ostial stenosis.  Small to moderate 2nd branch off ramus with 95% ostial stenosis.  Left Circumflex Relatively small LCx with comparatively large ramus.  Long up to 70% ostial and proximal LCx stenosis.  Right Coronary Artery Patent proximal and distal RCA stents, distal stent with 30% in-stent restenosis.  50-60% stenosis proximal RCA just distal to stent.  Diffuse up to 60% mid RCA stenosis.  Intervention  No interventions have been documented.   STRESS TESTS  MYOCARDIAL PERFUSION IMAGING 12/21/2016  Interpretation Summary  Nuclear stress EF: 37%.  Inferior defect that partially improves consistent with scar and possible soft tissue attenuation with small region of ischemia  EKG nondiagnostic due to baseline changes  Intermediate risk study  Compared to images from 2017, mild improvement in inferior region now present   ECHOCARDIOGRAM  ECHOCARDIOGRAM COMPLETE 01/23/2022  Narrative ECHOCARDIOGRAM REPORT    Patient Name:   [name] [name] [facility] Date of Exam: 01/23/2022 Medical Rec #:  [id number]              Height:  72.0 in Accession #:    [id number]             Weight:       199.6 lb Date of Birth:  07/08/1956              BSA:          2.129 m Patient Age:    68 years               BP:           106/69 mmHg Patient Gender: M                      HR:           69 bpm. Exam Location:  Inpatient  Procedure: 2D Echo, Cardiac Doppler and  Color Doppler  Indications:    R07.9* Chest pain, unspecified  History:        Patient has prior history of Echocardiogram examinations, most recent 12/28/2019. CHF, CAD, Arrythmias:Atrial Fibrillation and Atrial Flutter; Risk Factors:Sleep Apnea, Hypertension, ETOH and Diabetes.  Sonographer:    [name] [name] RDCS Referring Phys: [id number] LAURA R INGOLD  IMPRESSIONS   1. Left ventricular ejection fraction, by estimation, is 55 to 60%. The left ventricle has normal function. The left ventricle has no regional wall motion abnormalities. There is severe asymmetric left ventricular hypertrophy of the basal-septal segment. Left ventricular diastolic parameters are indeterminate. 2. Right ventricular systolic function is normal. The right ventricular size is normal. There is normal pulmonary artery systolic pressure. The estimated right ventricular systolic pressure is 24.5 mmHg. 3. The mitral valve is normal in structure. Trivial mitral valve regurgitation. No evidence of mitral stenosis. 4. The aortic valve is tricuspid. Aortic valve regurgitation is trivial. Aortic valve sclerosis is present, with no evidence of aortic valve stenosis. 5. The inferior vena cava is normal in size with greater than 50% respiratory variability, suggesting right atrial pressure of 3 mmHg.  FINDINGS Left Ventricle: Left ventricular ejection fraction, by estimation, is 55 to 60%. The left ventricle has normal function. The left ventricle has no regional wall motion abnormalities. The left ventricular internal cavity size was normal in size. There is severe asymmetric left ventricular hypertrophy of the basal-septal segment. Left ventricular diastolic parameters are indeterminate.  Right Ventricle: The right ventricular size is normal. No increase in right ventricular wall thickness. Right ventricular systolic function is normal. There is normal pulmonary artery systolic pressure. The tricuspid regurgitant velocity is  2.32 m/s, and with an assumed right atrial pressure of 3 mmHg, the estimated right ventricular systolic pressure is 24.5 mmHg.  Left Atrium: Left atrial size was normal in size.  Right Atrium: Right atrial size was normal in size.  Pericardium: There is no evidence of pericardial effusion.  Mitral Valve: The mitral valve is normal in structure. Trivial mitral valve regurgitation. No evidence of mitral valve stenosis.  Tricuspid Valve: The tricuspid valve is normal in structure. Tricuspid valve regurgitation is trivial.  Aortic Valve: The aortic valve is tricuspid. Aortic valve regurgitation is trivial. Aortic regurgitation PHT measures 476 msec. Aortic valve sclerosis is present, with no evidence of aortic valve stenosis.  Pulmonic Valve: The pulmonic valve was not well visualized. Pulmonic valve regurgitation is not visualized.  Aorta: The aortic root and ascending aorta are structurally normal, with no evidence of dilitation.  Venous: The inferior vena cava is normal in size with greater than 50% respiratory variability, suggesting right atrial pressure of 3 mmHg.  IAS/Shunts: The interatrial septum was not well visualized.   LEFT VENTRICLE PLAX 2D LVIDd:         4.60 cm      Diastology LVIDs:         3.20 cm      LV e' medial:    5.11 cm/s LV PW:         1.40 cm      LV E/e' medial:  16.8 LV IVS:        1.20 cm      LV e' lateral:   5.63 cm/s LVOT diam:     2.20 cm      LV E/e' lateral: 15.2 LV SV:         62 LV SV Index:   29 LVOT Area:     3.80 cm  LV Volumes (MOD) LV vol d, MOD A2C: 106.0 ml LV vol d, MOD A4C: 77.0 ml LV vol s, MOD A2C: 50.3 ml LV vol s, MOD A4C: 40.3 ml LV SV MOD A2C:     55.7 ml LV SV MOD A4C:     77.0 ml LV SV MOD BP:      49.8 ml  RIGHT VENTRICLE RV S prime:     9.81 cm/s TAPSE (M-mode): 1.8 cm  LEFT ATRIUM             Index        RIGHT ATRIUM           Index LA diam:        5.00 cm 2.35 cm/m   RA Area:     16.00 cm LA Vol (A2C):   58.8  ml 27.62 ml/m  RA Volume:   37.70 ml  17.71 ml/m LA Vol (A4C):   59.1 ml 27.76 ml/m LA Biplane Vol: 59.1 ml 27.76 ml/m AORTIC VALVE LVOT Vmax:         75.60 cm/s LVOT Vmean:        49.867 cm/s LVOT VTI:          0.162 m AI PHT:            476 msec AR Vena Contracta: 0.30 cm  AORTA Ao Root diam: 3.70 cm Ao Asc diam:  3.70 cm  MITRAL VALVE               TRICUSPID VALVE MV Area (PHT): 4.80 cm    TR Peak grad:   21.5 mmHg MV Decel Time: 158 msec    TR Vmax:        232.00 cm/s MV E velocity: 85.60 cm/s MV A velocity: 47.60 cm/s  SHUNTS MV E/A ratio:  1.80        Systemic VTI:  0.16 m Systemic Diam: 2.20 cm  [name] Nanas MD Electronically signed by [name] Nanas MD Signature Date/Time: 01/23/2022/10:32:01 AM    Final   TEE  ECHO TEE 10/02/2023  Narrative TRANSESOPHOGEAL ECHO REPORT    Patient Name:   [name] [name] [facility] Date of Exam: 10/02/2023 Medical Rec #:  [id number]              Height:       71.0 in Accession #:    [id number]             Weight:       203.8 lb Date of Birth:  January 18, 1956              BSA:          2.125  m Patient Age:    5 years               BP:           104/103 mmHg Patient Gender: M                      HR:           67 bpm. Exam Location:  Inpatient  Procedure: Transesophageal Echo, Cardiac Doppler and Color Doppler (Both Spectral and Color Flow Doppler were utilized during procedure).  Indications:     ; Atrial fibrillation and Flutter  History:         Patient has prior history of Echocardiogram examinations, most recent 12/14/2022. CHF, Abnormal ECG, Arrythmias:Atrial Flutter and Atrial Fibrillation, Signs/Symptoms:Chest Pain and Syncope; Risk Factors:Dyslipidemia, Sleep Apnea and Diabetes.  Sonographer:     [name] [name] Mose RDCS Referring Phys:  [id number] [name] [name] [facility] Diagnosing Phys: [name] McleanMD  PROCEDURE: After discussion of the risks and benefits of a TEE, an informed consent was obtained from the patient. The  transesophogeal probe was passed without difficulty through the esophogus of the patient. Imaged were obtained with the patient in a left lateral decubitus position. Sedation performed by different physician. The patient was monitored while under deep sedation. Anesthestetic sedation was provided intravenously by Anesthesiology: 170mg  of Propofol . The patient's vital signs; including heart rate, blood pressure, and oxygen saturation; remained stable throughout the procedure. The patient developed no complications during the procedure. A successful direct current cardioversion was performed at 150 joules with 1 attempt.  IMPRESSIONS   1. Left ventricular ejection fraction, by estimation, is 50 to 55%. The left ventricle has low normal function. The left ventricle has no regional wall motion abnormalities. There is severe asymmetric left ventricular hypertrophy of the septal segment. 2. Right ventricular systolic function is normal. The right ventricular size is normal. 3. Left atrial size was moderately dilated. No left atrial/left atrial appendage thrombus was detected. There was smoke in the LA appendage. 4. Right atrial size was moderately dilated. 5. The mitral valve is normal in structure. Mild mitral valve regurgitation. No evidence of mitral stenosis. 6. Lambl's excrescence noted. The aortic valve is tricuspid. Aortic valve regurgitation is mild. No aortic stenosis is present. 7. Grade IV plaque in the descending thoracic aorta. 8. 3D performed of the aortic valve. 9. No PFO/ASD by color doppler or bubbles.  FINDINGS Left Ventricle: Left ventricular ejection fraction, by estimation, is 50 to 55%. The left ventricle has low normal function. The left ventricle has no regional wall motion abnormalities. Strain imaging was not performed. The left ventricular internal cavity size was normal in size. There is severe asymmetric left ventricular hypertrophy of the septal segment.  Right Ventricle:  The right ventricular size is normal. No increase in right ventricular wall thickness. Right ventricular systolic function is normal.  Left [facility]: Left atrial size was moderately dilated. No left atrial/left atrial appendage thrombus was detected.  Right [facility]: Right atrial size was moderately dilated.  Pericardium: There is no evidence of pericardial effusion.  Mitral Valve: The mitral valve is normal in structure. Mild mitral valve regurgitation. No evidence of mitral valve stenosis.  Tricuspid Valve: The tricuspid valve is normal in structure. Tricuspid valve regurgitation is mild.  Aortic Valve: Lambl's excrescence noted. The aortic valve is tricuspid. Aortic valve regurgitation is mild. No aortic stenosis is present.  Pulmonic Valve: The pulmonic valve was normal in structure. Pulmonic valve regurgitation is not  visualized.  Aorta: Grade IV plaque in the descending thoracic aorta. The aortic root is normal in size and structure.  IAS/Shunts: No PFO/ASD by color doppler or bubbles.  Additional Comments: 3D imaging was not performed. Spectral Doppler performed.  [name] [name] Electronically signed by [name] Kanner Signature Date/Time: 10/02/2023/3:55:01 PM    Final  MONITORS  LONG TERM MONITOR (3-14 DAYS) 04/25/2021  Narrative Patch Wear Time:  14 days and 0 hours (2022-08-09T16:30:50-0400 to 2022-08-23T16:30:54-0400)  Patient had a min HR of 34 bpm, max HR of 152 bpm, and avg HR of 75 bpm. Predominant underlying rhythm was Sinus Rhythm. First Degree AV Block was present. 1 run of Ventricular Tachycardia occurred lasting 12 beats with a max rate of 126 bpm (avg 107 bpm). 4 Supraventricular Tachycardia runs occurred, the run with the fastest interval lasting 4 beats with a max rate of 135 bpm, the longest lasting 11.5 secs with an avg rate of 111 bpm. Second Degree AV Block-Mobitz I (Wenckebach) was present. Isolated SVEs were frequent (9.2%, 138470), SVE Couplets were  occasional (1.4%, 10681), and SVE Triplets were rare (<1.0%, 1432). Isolated VEs were rare (<1.0%, 12029), VE Couplets were rare (<1.0%, 12), and VE Triplets were rare (<1.0%, 1). Ventricular Bigeminy and Trigeminy were present. Difficulty discerning atrial activity at times making definitive diagnosis difficult to ascertain.  1. Predominant NSR, no definite atrial fibrillation noted. 2. 1 short NSVT run. 3. 1 run of type 1 2nd degree AVB. 4. Frequent PACs. 5. Rare PVCs     CARDIAC MRI  MR CARDIAC MORPHOLOGY W WO CONTRAST 06/01/2022  Narrative CLINICAL DATA:  ?Hypertrophic cardiomyopathy  EXAM: CARDIAC MRI  TECHNIQUE: The patient was scanned on a 1.5 Tesla GE magnet. A dedicated cardiac coil was used. Functional imaging was done using Fiesta sequences. 2,3, and 4 chamber views were done to assess for RWMA's. Modified Simpson's rule using a short axis stack was used to calculate an ejection fraction on a dedicated work [employer]. The patient received 10 cc of Gadavist . After 10 minutes inversion recovery sequences were used to assess for infiltration and scar tissue.  FINDINGS: Limited images of the lung fields showed no gross abnormalities.  Normal left ventricular size. Moderate-severe asymmetric hypertrophy of the basal to mid anteroseptum and inferoseptum. There was hypokinesis of the basal to mid septal wall with LV EF 47%. Normal right ventricular size with mildly decreased systolic function, RV EF 46%. Moderate left atrial enlargement, mild right atrial enlargement. Trileaflet aortic valve with no significant aortic stenosis and mild aortic insufficiency. No more than trivial mitral regurgitation.  On delayed enhancement imaging, there was about 50% wall thickness subendocardial late gadolinium enhancement (LGE) concentrically in all the basal ventricular segments. There was extensive mid-wall LGE in the mid anteroseptal and mid  inferoseptum.  MEASUREMENTS: MEASUREMENTS LVEDV 135 mL  LVEDVi 63 mL/m2  LVSV 63 mL  LVEF 47%  RVEDV 79 mL RVEDVi 37 mL/m2 RVSV 36 RVEF 46%  T1 1041, ECV 34%  IMPRESSION: 1. Normal LV size with moderate-severe asymmetric hypertrophy of the basal to mid inferoseptal and anteroseptum. Hypokinesis of the basal to mid septum. LV EF 47%. No evidence for LV outflow gradient (no turbulence in LVOT) and no mitral valve systolic anterior motion.  2.  Normal RV size with mildly decreased systolic function, EF 46%.  3. Non-coronary LGE pattern noted. Most consistent with hypertrophic cardiomyopathy (most LGE noted in the hypertrophied basal to mid septum). Less likely cardiac amyloidosis.  4. Mild increase in  extracellular volume percentage at 34% is consistent with hypertrophic cardiomyopathy.  [name] Mclean   Electronically Signed By: [name] [name] M.D. On: 06/03/2022 10:57   ______________________________________________________________________________________________       Laboratory Data: High Sensitivity Troponin:   Recent Labs  Lab 06/03/24 1001 06/03/24 1159  TROPONINIHS 81* 75*     Chemistry Recent Labs  Lab 06/03/24 1001 06/04/24 0814  NA 134* 137  K 3.3* 3.1*  CL 105 98  CO2 22 28  GLUCOSE 234* 234*  BUN 8 9  CREATININE 1.14 1.31*  CALCIUM  8.3* 8.9  GFRNONAA >60 59*  ANIONGAP 7 11    Recent Labs  Lab 06/03/24 1001 06/04/24 0814  PROT 6.9 7.1  ALBUMIN 3.0* 3.0*  AST 22 15  ALT 16 14  ALKPHOS 65 60  BILITOT 1.6* 1.9*   Lipids No results for input(s): CHOL, TRIG, HDL, LABVLDL, LDLCALC, CHOLHDL in the last 168 hours.  Hematology Recent Labs  Lab 06/03/24 1001 06/04/24 0814  WBC 6.5 6.1  RBC 5.56 5.56  HGB 16.1 16.3  HCT 49.4 48.8  MCV 88.8 87.8  MCH 29.0 29.3  MCHC 32.6 33.4  RDW 15.6* 15.3  PLT 175 186   Thyroid  No results for input(s): TSH, FREET4 in the last 168 hours.  BNP Recent Labs  Lab  06/03/24 0938  BNP 321.4*    DDimer No results for input(s): DDIMER in the last 168 hours.  Radiology/Studies:  CT HEAD WO CONTRAST ( ) Result Date: 06/04/2024 EXAM: CT HEAD WITHOUT CONTRAST 06/04/2024 11:45:00 AM TECHNIQUE: CT of the head was performed without the administration of intravenous contrast. Automated exposure control, iterative reconstruction, and/or weight based adjustment of the mA/kV was utilized to reduce the radiation dose to as low as reasonably achievable. COMPARISON: Head CT 11/24/2023 and MRI 08/22/2023. CLINICAL HISTORY: Headache, sudden, severe; Hypertensive emergency. FINDINGS: BRAIN AND VENTRICLES: There is no evidence of an acute infarct, intracranial hemorrhage, mass, midline shift, hydrocephalus, or extra-axial fluid collection. Patchy cerebral white matter hypodensities are similar to the prior CT and nonspecific but compatible with moderate chronic small vessel ischemic disease. There is mild chronic heterogeneity in the deep gray nuclei. There is mild cerebral atrophy. A small chronic left cerebellar infarct is unchanged. Calcified atherosclerosis at the skull base. ORBITS: No acute abnormality. SINUSES: Mucosal thickening in the paranasal sinuses. Clear mastoid air cells. SOFT TISSUES AND SKULL: No acute soft tissue abnormality. No skull fracture. IMPRESSION: 1. No acute intracranial abnormality. 2. Moderate chronic small vessel ischemic disease. Electronically signed by: Dasie Hamburg MD 06/04/2024 11:58 AM EDT RP Workstation: [id number]   DG Chest Port 1 View Result Date: 06/03/2024 CLINICAL DATA:  Left-sided chest pain and shortness of breath since last night. EXAM: PORTABLE CHEST 1 VIEW COMPARISON:  01/02/2024 FINDINGS: Lungs are adequately inflated demonstrate bilateral minimal hazy prominence of the central pulmonary vessels suggesting mild vascular congestion. No lobar consolidation or significant effusion. Stable borderline cardiomegaly. Remainder of the exam  is unchanged. IMPRESSION: Borderline cardiomegaly with suggestion of mild vascular congestion. Electronically Signed   By: Toribio Agreste M.D.   On: 06/03/2024 10:16     Assessment and Plan:  HTN emergency  Elevated Troponin  - Patient has struggled with medication compliance. His daughter reports that he is often confused about what medications he is suppose to take  - BP as high as 217/125 in the ED. Was started on IV nitroglycerin  drip.  - Reviewed recent advanced heart failure clinic note from 05/27/24- recommended medicines included losartan  25  mg daily, eplerenone  25 mg daily, torsemide  40 mg BID, carvedilol  12.5 mg BID, imdur  120 mg daily   - Patient was started on carvedilol  12.5 mg BID and hydralazine  50 mg BID on admission. OK to continue carvedilol . Stop hydralazine  in attempt to get him on more once daily medications and hopefully improve compliance  - Start losartan  25 mg daily  - Start eplerenone  25 mg daily  - Start imdur  120 mg daily  - Repeat BMP in AM to monitor renal function and K.   Chronic HFpEF - Patient previously had EF 47% on cardiac MRI in 2014.  However, EF has since improved. - Cardiac MRI 05/2022 showed findings consistent with hypertrophic cardiomyopathy, less likely cardiac amyloidosis.  No evidence of LV outflow gradient - Echocardiogram from 08/2023 (through [facility]) showed EF of 60-65%, severe concentric LVH, trace MR - Patient presented with shortness of breath.  BNP 321.  Chest x-ray with suggestion of mild vascular congestion - Patient was given 1 dose IV Lasix  20 mg yesterday.  Put out 1.35 L urine.  Appears euvolemic on exam today - Reduce torsemide  to 40 mg once daily - Resume home Jardiance  25 mg daily - Resume home eplerenone  25 mg daily as above - Needs advanced heart failure clinic follow-up at discharge.  Would benefit from paramedics and support.  Also needs to use his CardioMEMS device  CAD  Elevated troponin - Patient has long history of  CAD.  Previously had DES placed to the distal RCA and proximal RCA in 2016.  Most recent cath in 06/2019 showed diffuse distal and branch vessel CAD.  There were no interventional targets.  Anatomy not amenable to CABG.  Recommended medical management - High-sensitivity troponin 81> 75.  Patient denies chest pain - Troponin elevation is flat, not consistent with ACS.  Suspect demand ischemia from hypertensive emergency - Not on antiplatelet given use of Eliquis  for atrial fibrillation/flutter -Continue carvedilol  12.5 mg twice daily - Continue Lipitor  80 mg daily  Paroxysmal Atrial Fib/Flutter  - Patient has remote history of atrial flutter ablations x 2.  In the past had been treated with sotalol , this has been stopped since at least 08/2023.  In 09/2023, patient loaded with amiodarone  and underwent TEE guided DCCV.  He was lost to follow-up, went into atrial flutter sometime in 11/2023. - Patient currently in atrial flutter with variable AV block, heart rate well-controlled - Stop amiodarone .  Patient has not been taking this.  Additionally, he did not have much benefit from this as he went back into atrial fibrillation after cardioversion in 09/2023 - Patient has been referred to the A-fib clinic as an outpatient to discuss rhythm management - Patient does have prior history of alcohol abuse.  Continues to drink up to 3-4 beers and 1-2 drinks of liquor while watching football on the weekends.  Needs alcohol cessation - Patient has not been taking his Eliquis .  He says he was confused about this medication.  Resume Eliquis  5 mg twice daily - Continue carvedilol  12.5 mg twice daily  Risk Assessment/Risk Scores:   New York  Heart Association (NYHA) Functional Class NYHA Class II  CHA2DS2-VASc Score = 7   This indicates a 11.2% annual risk of stroke. The patient's score is based upon: CHF History: 1 HTN History: 1 Diabetes History: 1 Stroke History: 2 Vascular Disease History: 1 Age Score:  1 Gender Score: 0   For questions or updates, please contact [facility] Please consult [email or web address] for contact info under  Signed, Rollo FABIENE Louder, PA-C  06/04/2024 3:48 PM  History and all data above reviewed.  I personally took the history today, performed the physical exam and formulated the substantive portion of the assessment and plan.   I reviewed all relevant tests and studies. Patient examined.  I agree with the findings as above.  The patient lives by himself.  He has 5 children.  They check on him frequently.  He is a retired Scientist, water quality.  He still drives and goes to the grocery store.  He denies any chest pressure, neck or arm discomfort.  He does not really have any palpitations.  He just does not like to take his medications because he does not think they feel well.  He gets them in pill packs.  They get confusing with meds change.  His family can convince him to be more compliant and they tried.  He came in because he got acutely short of breath.  He does not describe usual shortness of breath and has not had any chest discomfort, neck or arm discomfort but has had a sudden increase in shortness of breath and is found to be hypertensive as above.  He is also in atrial flutter with variable conduction.  He is had a long history of arrhythmias and has failed sotalol .  He does not take amiodarone  and prescribed it does not look like it kept him in sinus rhythm.  He is probably not compliant with his Eliquis .   The patient exam reveals COR: Irregular, no murmurs,  Lungs: Clear to auscultation bilaterally, no wheezing, crackles,  Abd: Positive bowel sounds normal frequency pitch, bruits, rebound, guarding, Ext 2+ pulses, no edema.    All available labs, radiology testing, previous records reviewed. Agree with documented assessment and plan.   Hypertensive urgency: He is currently on IV nitro and we are going to restart some of his medications as above.  I like to try to  consolidate to once daily drugs I talked to his daughter at length about this.  We might also consider a Catapres patch to assist with his compliance.  We are going to use amlodipine , we can titrate Cozaar  pending his renal function.  We will restart the Imdur .  I likely would switch this admission to once daily beta-blocker.  He will continue on eplerenone .  Atrial flutter: We will use DOAC.  We will remove sotalol  from his list as he has previously failed this.  I am going to remove the amiodarone  since he is not maintaining sinus rhythm.  With his noncompliance I wonder if ultimately we should consider a Watchman.  Chronic diastolic heart care: I will reduce to torsemide  for now and resume his Jardiance  and eplerenone .  We will follow his creatinine closely.    Lynwood Shanel Prazak  4:03 PM  06/04/2024

## 2024-06-04 NOTE — Inpatient Diabetes Management (Signed)
 Inpatient Diabetes Program Recommendations  AACE/ADA: New Consensus Statement on Inpatient Glycemic Control (2015)  Target Ranges:  Prepandial:   less than 140 mg/dL      Peak postprandial:   less than 180 mg/dL (1-2 hours)      Critically ill patients:  140 - 180 mg/dL   Lab Results  Component Value Date   GLUCAP 193 (H) 06/04/2024   HGBA1C 9.8 (A) 10/28/2023    Review of Glycemic Control  Latest Reference Range & Units 06/03/24 21:29 06/04/24 06:33  Glucose-Capillary 70 - 99 mg/dL 704 (H) 806 (H)  (H): Data is abnormally high  Diabetes history: DM2 Outpatient Diabetes medications: Lantus  30 units every day, Humalog  0-10 units TID, Jardiance  25 mg every day, Metformin  1 gram with breakfast, Freestyle Libre 3 Current orders for Inpatient glycemic control: Semglee  30 units QHS  Inpatient Diabetes Program Recommendations:    Please consider:  Novolog  0-9 units TID and 0-5 units at bedtime  Thank you, Wyvonna Pinal, MSN, CDCES Diabetes Coordinator Inpatient Diabetes Program [phone] (team pager from 8a-5p)

## 2024-06-04 NOTE — Progress Notes (Signed)
 Progress Note   Patient: [name] [name] FMW:[id number] DOB: [birth year] DOA: 06/03/2024     1 DOS: the patient was seen and examined on 06/04/2024   Brief hospital course:  68 y.o. male with medical history significant of hypertension, atrial fibrillation on apixaban , hyperlipidemia, poorly controlled type 2 diabetes, CKD (baseline creatinine appears to be 1.4-1.6), and recent admission in 08/2023 for expressive aphasia 2/2 TIA/HTN emergency p/w hypertensive emergency.   The patient reported experiencing shortness of breath and inability to sleep the previous night, stating they were up all night fighting for air. The patient mentioned attempting to alleviate the symptoms by sitting outside on the porch multiple times but was unable to obtain relief. The patient decided to seek medical help in the morning and called their daughter around eight o'clock. The patient indicated missing several medications but was unsure of which ones or how many days they had missed.   In the ED, the patient was hypertensive to 170-190s and tachypneic to 27 on RA. Labs notable for  K 3.3, BNP 321, and troponin 81 > 75. EDP started IV lasix  20mg  x1 and nitroglycerin  gtt and requested medicine admission.  Assessment and Plan:  # Hypertensive emergency  - Presented with elevated blood pressures, noted as high as 217/125 in the setting of poor medication compliance. Reports taking his medications 2-3 days a week only.  - Associated symptoms included chest discomfort, shortness of breath and headache - Was started on nitroglycerin  drip in the ED, unsuccessful attempts at weaning down overnight - Home antihypertensives include losartan  25 mg daily, hydralazine  50 mg twice daily, eplerenone  25 mg daily, Imdur  120 mg daily, torsemide  40 mg twice daily - Currently on nitroglycerin  drip, Coreg  12.5 mg twice daily, and torsemide  40 mg twice daily - Given cardiac history and issues with medication compliance, consulted  cardiology for assistance with medication management/consolidation - Was started on amlodipine  5 mg daily, losartan  25 mg daily, Imdur  120 mg daily, and eplerenone  25 mg daily   # Right-sided headache - Reports onset of severe right-sided headache yesterday, worse this morning, not improving with meds. Reports this is the worst headache he's had in a while - No appreciable neurologic deficits on exam although he reports blurry vision that is not new  - CT head w/o contrast ordered to rule out any intracranial bleeding - PRN tylenol    # Elevated creatinine  - Cr elevated to 1.31 from 1.14 on admission. However, baseline Cr appears to be around 1.40s most recently - Will continue to monitor  # HFpEF (LVEF 60-65%) - Was recently seen in the cardiology clinic. Given his medical noncompliance, he reports DOE, 3 pillow orthopnea, frequent PND necessitating him to walk out to his front porch for relief - BNP on admission 321 - CXR showed findings consistent with mild vascular congestion and stable borderline cardiomegaly.  - Received IV Lasix  20 mg once in the ED - Cardiology recommended reduction of Torsemide  to 40 mg once daily, and resumption of home Jardiance  25 mg daily and eplerenone  25 mg daily  # Hypokalemia - K 3.1 - Repleted  # Paroxysmal Afib/flutter - Currently rate controled without medications. Per cardiology notes, he is no longer on amiodarone  or sotalol  - Continue home Eliquis  BID and Coreg  12.5 mg BID  # T2DM - Hgb A1c 8.4 (06/04/2024) - Follows with Dr. Dartha with outpatient endocrinology - Diabetes educator following  - Continue Semglee  30 units nightly and SSI  ACHS  # Chronic alcohol use -  Has prior history of alcohol abuse, more recently drinks 1 beer a day with up to 3-4 beers plus 1-2 drinks of liquor while watching football on weekends - CIWA protocol initiated - Thiamine , folate, and multivitamin supplementation - TOC consulted for alcohol cessation  counseling     Subjective: Patient seen at bedside this morning. Patient's daughter, Ms. Laymon, was also at bedside. Patient reports having shortness of breath and a headache, which prompted his arrival to the ED. He continues to feel short of breath and reports worsening of his headache, stating it's the worse headache he's had in a long time, not improving with tylenol  or ibuprofen. He reports noncompliance with his medications at home due to the large amount of meds he has to take, not feeling well after taking them, the way they are packaged from the mail order service, and being confused about the adjustments made to his meds. He lives by himself but has previously enlisted help with medication management including using pill boxes, but ultimately  He was seen about 1.5 weeks ago at the cardiology clinic, but he still reports noncompliance with his medications. Reports taking his meds 2 or 3 days in a week only. Reports 2-3 pillow orthopnea and frequent nighttime awakenings due to SOB requiring him to walk out onto his front porch to catch his breath.   Physical Exam: Vitals:   06/04/24 0000 06/04/24 0439 06/04/24 0600 06/04/24 0700  BP: (!) 180/80 (!) 177/104 (!) 155/94 138/70  Pulse: 64 80 74 63  Resp: 20 20 20 19   Temp:  (!) 97.5 F (36.4 C)  98.3 F (36.8 C)  TempSrc:  Oral  Oral  SpO2: 93% 97%  96%  Weight:      Height:       Physical Exam Constitutional:      General: He is not in acute distress.    Appearance: He is not ill-appearing.  HENT:     Mouth/Throat:     Mouth: Mucous membranes are moist.  Cardiovascular:     Rate and Rhythm: Normal rate. Rhythm irregularly irregular.     Heart sounds: Normal heart sounds. No murmur heard. Pulmonary:     Effort: Pulmonary effort is normal. No respiratory distress.     Breath sounds: Normal breath sounds. No wheezing.  Abdominal:     General: Bowel sounds are normal. There is no distension.     Palpations: Abdomen is soft.      Tenderness: There is no abdominal tenderness. There is no guarding.  Musculoskeletal:     Right lower leg: No edema.     Left lower leg: No edema.  Skin:    General: Skin is warm and dry.     Capillary Refill: Capillary refill takes less than 2 seconds.  Neurological:     Mental Status: He is alert and oriented to person, place, and time. Mental status is at baseline.     Family Communication: Discussed with patient's daughter, Laymon, at bedside  Disposition: Status is: Inpatient Remains inpatient appropriate because: requires titration off nitroglycerin  drip and transition to stable oral antiHTN regimen  Planned Discharge Destination: Home    Time spent: 58 minutes  Author: Duffy Larch, MD 06/04/2024 7:42 AM  For on call review [email or web address].

## 2024-06-04 NOTE — Hospital Course (Addendum)
 68 y.o. male with medical history significant of hypertension, atrial fibrillation on apixaban , hyperlipidemia, poorly controlled type 2 diabetes, CKD (baseline creatinine appears to be 1.4-1.6), and recent admission in 08/2023 for expressive aphasia 2/2 TIA/HTN emergency p/w hypertensive emergency.   The patient reported experiencing shortness of breath and inability to sleep the previous night, stating they were up all night fighting for air. The patient mentioned attempting to alleviate the symptoms by sitting outside on the porch multiple times but was unable to obtain relief. The patient decided to seek medical help in the morning and called their daughter around eight o'clock. The patient indicated missing several medications but was unsure of which ones or how many days they had missed.   In the ED, the patient was hypertensive to 170-190s and tachypneic to 27 on RA. Labs notable for  K 3.3, BNP 321, and troponin 81 > 75. EDP started IV lasix  20mg  x1 and nitroglycerin  gtt and requested medicine admission.

## 2024-06-05 ENCOUNTER — Ambulatory Visit: Admitting: Family Medicine

## 2024-06-05 ENCOUNTER — Inpatient Hospital Stay (HOSPITAL_COMMUNITY)

## 2024-06-05 DIAGNOSIS — I4891 Unspecified atrial fibrillation: Secondary | ICD-10-CM

## 2024-06-05 DIAGNOSIS — I5032 Chronic diastolic (congestive) heart failure: Secondary | ICD-10-CM | POA: Diagnosis not present

## 2024-06-05 DIAGNOSIS — I6782 Cerebral ischemia: Secondary | ICD-10-CM | POA: Diagnosis not present

## 2024-06-05 DIAGNOSIS — I161 Hypertensive emergency: Secondary | ICD-10-CM | POA: Diagnosis not present

## 2024-06-05 DIAGNOSIS — I639 Cerebral infarction, unspecified: Secondary | ICD-10-CM | POA: Diagnosis not present

## 2024-06-05 DIAGNOSIS — R29818 Other symptoms and signs involving the nervous system: Secondary | ICD-10-CM | POA: Diagnosis not present

## 2024-06-05 LAB — GLUCOSE, CAPILLARY
Glucose-Capillary: 112 mg/dL — ABNORMAL HIGH (ref 70–99)
Glucose-Capillary: 144 mg/dL — ABNORMAL HIGH (ref 70–99)
Glucose-Capillary: 118 mg/dL — ABNORMAL HIGH (ref 70–99)
Glucose-Capillary: 219 mg/dL — ABNORMAL HIGH (ref 70–99)

## 2024-06-05 LAB — CBC
HCT: 42.2 % (ref 39.0–52.0)
Hemoglobin: 14.1 g/dL (ref 13.0–17.0)
MCH: 29.3 pg (ref 26.0–34.0)
MCHC: 33.4 g/dL (ref 30.0–36.0)
MCV: 87.7 fL (ref 80.0–100.0)
Platelets: 163 K/uL (ref 150–400)
RBC: 4.81 MIL/uL (ref 4.22–5.81)
RDW: 15.4 % (ref 11.5–15.5)
WBC: 7.1 K/uL (ref 4.0–10.5)
nRBC: 0 % (ref 0.0–0.2)

## 2024-06-05 LAB — COMPREHENSIVE METABOLIC PANEL WITH GFR
ALT: 12 U/L (ref 0–44)
AST: 14 U/L — ABNORMAL LOW (ref 15–41)
Albumin: 2.6 g/dL — ABNORMAL LOW (ref 3.5–5.0)
Alkaline Phosphatase: 48 U/L (ref 38–126)
Anion gap: 10 (ref 5–15)
BUN: 18 mg/dL (ref 8–23)
CO2: 26 mmol/L (ref 22–32)
Calcium: 8.9 mg/dL (ref 8.9–10.3)
Chloride: 102 mmol/L (ref 98–111)
Creatinine, Ser: 1.74 mg/dL — ABNORMAL HIGH (ref 0.61–1.24)
GFR, Estimated: 42 mL/min — ABNORMAL LOW (ref >60)
Glucose, Bld: 159 mg/dL — ABNORMAL HIGH (ref 70–99)
Potassium: 3.3 mmol/L — ABNORMAL LOW (ref 3.5–5.1)
Sodium: 138 mmol/L (ref 135–145)
Total Bilirubin: 1.2 mg/dL (ref 0.0–1.2)
Total Protein: 6 g/dL — ABNORMAL LOW (ref 6.5–8.1)

## 2024-06-05 LAB — PHOSPHORUS: Phosphorus: 4.1 mg/dL (ref 2.5–4.6)

## 2024-06-05 LAB — MAGNESIUM: Magnesium: 1.7 mg/dL (ref 1.7–2.4)

## 2024-06-05 MED ORDER — POTASSIUM CHLORIDE CRYS ER 20 MEQ PO TBCR
40.0000 meq | EXTENDED_RELEASE_TABLET | Freq: Once | ORAL | Status: AC
Start: 2024-06-05 — End: 2024-06-06
  Administered 2024-06-06: 40 meq via ORAL
  Filled 2024-06-05: qty 2

## 2024-06-05 MED ORDER — METOPROLOL SUCCINATE ER 25 MG PO TB24
25.0000 mg | ORAL_TABLET | Freq: Every day | ORAL | Status: DC
  Administered 2024-06-06 – 2024-06-08 (×3): 25 mg via ORAL
  Filled 2024-06-05 (×3): qty 1

## 2024-06-05 NOTE — TOC CM/SW Note (Signed)
 Transition of Care [facility]) - Inpatient Brief Assessment   Patient Details  Name: [name] [name] MRN: [id number] Date of Birth: 1955-09-19  Transition of [facility]) CM/SW Contact:    Lauraine FORBES Saa, LCSWA Phone Number: 06/05/2024, 11:11 AM   Clinical Narrative:  11:11 AM Per chart review, patient resides at home alone. Patient has a PCP and insurance. Patient does not have SNF history. Patient has HH history with [facility]. Patient has DME history (CPAP) with [facility]. Patient's preferred pharmacy's are [facility], [facility] [address], [place], and [place]. CSW provided substance use and SDOH (social connections) resources to patient. Patient consented CSW to speak to and in front of patient's daughter, [name]. No other TOC needs identified at this time. TOC will continue to follow.  Transition of Care Asessment: Insurance and Status: Insurance coverage has been reviewed Patient has primary care physician: Yes Home environment has been reviewed: Private Residence Prior level of function:: N/A Prior/Current Home Services: No current home services Social Drivers of Health Review: SDOH reviewed interventions complete Readmission risk has been reviewed: Yes (Currently Green 12%) Transition of care needs: no transition of care needs at this time

## 2024-06-05 NOTE — Inpatient Diabetes Management (Signed)
 Inpatient Diabetes Program Recommendations  AACE/ADA: New Consensus Statement on Inpatient Glycemic Control  Target Ranges:  Prepandial:   less than 140 mg/dL      Peak postprandial:   less than 180 mg/dL (1-2 hours)      Critically ill patients:  140 - 180 mg/dL    Latest Reference Range & Units 06/04/24 06:33 06/04/24 11:11 06/04/24 16:20 06/04/24 21:10 06/05/24 06:24  Glucose-Capillary 70 - 99 mg/dL 806 (H) 650 (H) 896 (H) 223 (H) 112 (H)   Review of Glycemic Control  Diabetes history: DM2 Outpatient Diabetes medications: Lantus  30 units every day, Humalog  0-10 units TID, Jardiance  25 mg every day, Metformin  1 gram with breakfast, Freestyle Libre 3 Current orders for Inpatient glycemic control: Semglee  30 units at bedtime, Novolog  0-9 units TID with meals, Novolog  0-5 units at bedtime, Jardiance  25 mg daily  Inpatient Diabetes Program Recommendations:    Insulin : If post prandial glucose remains consistently over 180 mg/dl, please consider ordering Novolog  3 units TID with meals for meal coverage if patient eats at least 50% of meals.  Thanks, Earnie Gainer, RN, MSN, CDCES Diabetes Coordinator Inpatient Diabetes Program [phone] (Team Pager from 8am to 5pm)

## 2024-06-05 NOTE — Discharge Instructions (Addendum)
 Blood pressure log: - Please check your blood pressure twice a day--once in the morning and once at bedtime--record your readings in a log, and bring it to your next appointment with your primary care provider to help guide any needed changes to your treatment.  Daily Weight Checks: - Weigh yourself every morning after urinating and before eating. - Keep a log of your daily weights and report any sudden gains (2-3 pounds in a day or 5 pounds in a week) to your healthcare provider.  Track Symptoms: - Monitor for increased shortness of breath, swelling in your legs, ankles, or feet, and fatigue. - Keep a symptom diary to track any changes or new symptoms.  Diet and Nutrition - Limit Salt Intake: - Aim for less than 2,300 mg of sodium per day (your doctor might recommend even less). - Avoid processed foods, canned soups, and salty snacks. - Read food labels carefully. - Fluid Management: - Your doctor may recommend limiting fluids to 2 liters (about 8 cups) per day. - Be cautious of hidden fluids in foods like soups, fruits, and gelatin. Healthy Eating: - Focus on a diet rich in fruits, vegetables, whole grains, and lean proteins. - Avoid foods high in saturated fats, trans fats, and cholesterol.       Social Connections   -PACE (Adult Program)  - Address: [address] [place], [place] [address] - General office #:  [phone] - Enrollment Phone #: [phone]  -Institute of Aging  - Senior Friendship Line: call toll free, available 24 hours a day, at [phone]   -[place] 211  [place] 2-1-1 is another useful way to locate resources in the community. Visit shedsizes.ch to find service information online. If you need additional assistance, 2-1-1 Referral Specialists are available 24 hours a day, every day by dialing 2-1-1 or ([phone] from any phone. The call is free, confidential, and available in any language.  -[facility] of [place]: [phone] / [address] [place], [place] [address]  -Dial  988: Talk lifeline 24/7.  -[place]  - Promise Resource Network Warmline: 4353892851  Community Resource Guide Inpatient Behavioral Health/Residential Substance Abuse Treatment - Adults The United Way's "211" is a great source of information about community services available.  Access by dialing 2-1-1 from anywhere in [place] , or by website -  pooledincome.pl.    (Updated 11/2015)   Crisis Assistance 24 hours a day     Services Offered       Area [employer] 24-hour crisis assistance: [phone] [place], [place]  [facility] 24-hour crisis assistance:[phone] [place], [place]  [place] 24-hour crisis assistance: ([phone] [place], [place]  [facility] Access to Care Line 24-hour crisis assistance; (332)220-7225 All  [facility] 24-hour crisis response line: 786-600-6689 All    Other Local Resources (Updated 08/2015)   Inpatient Behavioral Health/Residential Substance Abuse Treatment Programs     Services          Address and Phone Number  ADATC (Alcohol Drug Abuse Treatment Center)   14-day residential rehabilitation  [phone] [address] [employer], [place]  ARCA ([facility]iction [facility])    Detox - private pay only 14-day residential rehabilitation -  Medicaid, insurance, private pay only [phone], or [phone] [address] [place], [place] [address]   [facility] only Multiple facilities [phone] admissions    BATS ([facility])   90-day program Must be homeless to participate   [phone], or [phone]  [name] [name], [place]  [facility] only [phone], or  [phone] [address] [place], [place] [address]  [facility] Residential Treatment Services Must make an appointment Accepts private pay, Ursula Mosses [facility] [phone]  [address] [place], [place] [address]   [facility] Females only Associated with the [facility] [phone] [address] [place], [place] [address]  [facility] only [phone], or [phone] [address] [place], [id number]  [facility]     Detox Residential rehabilitation Private insurance only Multiple locations [phone] admissions  [facility]     Private pay Private insurance [phone] [address] [place], [place] [address]  [facility]     Males only Fee required at time of admission [phone] [address] [place], [place] [address]  [facility]     Private pay only   [phone] [address] [place], [place]  RTS (Residential Firefighter)    Detox - private pay, Medicaid Residential rehabilitation for males  - Medicare, Medicaid, insurance, private pay [phone] [address] [place], [place]   UMNDJ              Walk-in interviews Monday - Saturday from 8 am - 4 pm Individuals with legal charges are not eligible [phone] [address] [place], [place] [address]  [facility]  Must be willing to work Must attend Alcoholics Anonymous meetings [phone] [address] [place], [place]   [facility] [employer]    Faith-based program Private pay only [phone] [address] [place], [place]                                        Outpatient Substance Abuse  Treatment- uninsured  Narcotics Anonymous 24-HOUR HELPLINE Pre-recorded for Meeting Schedules [place] [facility] [phone]  WWW.PIEDMONTNA.COM ALCOHOLICS ANONYMOUS  [place]   Answering Service [phone] Please Note: All [place] Meetings are Non-smoking findspice.es  Alcohol and Drug Services -  Insurance: Medicaid /State funding/private insurance Methadone, suboxone/Intensive  outpatient  [place]   [phone] Fax: [phone] [address] [place], [place], [address] [place] [phone] Fax: [phone]    [address], [place], [place], [address] ([address] [place], [place], [place], [place], [place], [place], [place], [place]) Caring Services http://www.caringservices.org/ Accepts State funding/Medicaid Transitional housing, Intensive Outpatient Treatment, Outpatient treatment, Veterans Services  Phone: [phone] Fax: [phone] Address: [address], [place] [address]   [employer] of Care (http://carterscircleofcare.info/) Insurance: Medicaid Case Management, Administrator, Arts, Medication Management, Outpatient Therapy, Psychosocial Rehabilitation, Substance Abuse Intensive Outpatient  Phone: [phone] Fax: [phone] [address], [place], [place], [address]  [facility] Medicaid, most private insurance providers Types of Program: Individual/Group Therapy, Substance Abuse Treatment  Phone: [place] [phone] Fax: [phone] [address], [place], [place], [address] [place] [phone] [address] [place], [place], [address]  [facility]  Medicaid Types of Program: SAIOP  Phone: [phone] Fax: [phone] [address] [place], [place], [place], [address] RHA Medicaid/state funds Crisis line [phone] [facility] [phone] [place] [phone] [place] [phone]  Essential Life Connections [address];  [place], [place] [address] [phone]  Substance Abuse Intensive Outpatient Program [facility]SA Assessment  and Counseling Services [address] [place], [place] [address] [phone]- Substance abuse treatment  Successful Transitions  Insurance: [facility], [address], sliding scale Types of Program: substance abuse treatment, transportation assistance Phone: [phone] Fax:  [phone] Address: [address], [place] [address] [facility] (trendswap.ch) Insurance: UHC, [place], [place], [place] Program: addiction counseling, detoxification,  Phone: [phone]  Fax: [phone] Address: [address] [place], [place] [address]  [facility] (statewide facilities/programs) [address] (Medicaid/state funds) [place], [place] [address]                      Http://barrett.com/ [phone] [name] [name]- [phone] [place]- [phone] [facility] (2 Locations) (Medicaid/state funds) --[address]  walk in 8:30-12 and 1-2:30 [place], [id number]   [facility]- [phone] --[address] [place], [place] [address]  [phone] walk in 8:30-12 and 2-3:30  [facility] state funds/medicaid [address] [place], [place] [address] [phone] [facility] (Suboxone clinic) Medicaid/state funds  [address]  [place], [place] [address] [phone]   [facility]  [address] [place], [place] [address]  [phone] (24 hours) [place]- [address] [place], [place] [address]  [phone] (24 hours) [place]- [address] [place] [phone] [place]- [address] [place] [phone] [place] [address] [place] [phone] [facility]- Medicaid and state funds  [place]- [address] [place], [place] [address] [phone] (24 hours) [place]- [address] [place], [place] [address] [phone] [facility]- [address] [place], [place] [address] [phone] (24 hours) [place] [address] [place], [place]  [address] [phone] [place]- [address] [facility] [address] [facility] [address] [place] [phone]           Outpatient Substance Use Treatment Services       [facility] Out[facility]    Chemical Dependence Intensive Outpatient Program   [address]   [place], [place] [address]    [phone]   Private  insurance, Medicare A&B, and [facility] Residential Treatment Facility       ADS (Alcohol and Drug Services)    [address],    [place], [place] [address]   [phone]   Medicaid, Self Pay       [facility]        [address]    [place], [place]   [phone]   Medicaid and [facility], Self Pay       [facility]   [address]    [place], [place]    [phone]   [facility], and Self Pay      Fellowship [place]        [address]      [place], [place] [address]    [phone] or [phone]   Private Insurance Only                                                 [facility]   [address]    [place], [place]  [phone]   [phone]   Medicaid, Medicare, Private Insurance      [facility] Counseling Services at the [facility]   [address]    [place], [place]  [address]   [phone]   Services are free or reduced      [facility]    [address]   [phone]    Self Pay only, sliding scale      [facility]    [address]    [place], [place] [address]   [phone]   (Open Door ministry)   Self Pay, Medicaid Only       [facility]   [address], [place] [address]   [phone]   Medicaid, Medicare, Private Insurance                                                               Adolescent Substance Use Treatment Services          [facility]   [address]    [place], [place]    [phone]   Self Pay   Offer scholarships from the [facility] to help pay for treatment    Website: http://www.lambert.com/      [facility]   Adolescent Substance use Program   Males ages: 60-17   Adolescent Substance use Program   Females: 12-17      [facility]   [address]  [place], [place] [address]   (ph) [phone]  (fax) [phone]      [facility]    [address]  [place], [place] [address]   (ph) [phone]  (fax) [phone]      [place]   [address]  [place], [place] [address]   (ph) [phone]  (fax) [phone]      [facility]   [address], [place], [place] [address]   (ph) [phone]   (fax) [phone]      Website: [email or web address].com/                           [facility]   Substance Abuse Intensive Outpatient Program for Adolescents   Phone: [phone]   Address: [address], [place], [place]   Website: https://wilkins.info/         Residential Substance Use [employer]/support/administrative Officer ([facility]    [address]    [place], [place] [address]    [phone] or [phone]   Detox (Medicare, Medicaid, private insurance, and self pay)    Residential Rehab 14 days (Medicare, Medicaid, private insurance, and self pay)       RTS (Residential Treatment Services)    [address] [place], [place]    [phone]    Male and Male Detox (Self Pay and Medicaid limited availability)    Rehab only Male (Medicaid and self pay only)       Fellowship [address]        [address]    [place], [place] [address]    [phone] or [phone]   Detox and Residential Treatment   Private Insurance Only       [facility]    [address] [place]    [place], [place] [address]    [phone]  Treatment Only, must make assessment appointment, and must be sober for assessment appointment.    Self Pay Only, Medicare A&B, [facility] [place] ID only!   *Transportation assistance offered from  [place] on [employer]       [address]   [place], [place] [address]   Walk in interviews M-Sat 8-4p   No pending legal charges   [phone]               ADATC:  [facility]   Referral    [address]   [place], [place]   [phone]   (Self Pay, [facility])      [facility]   [address] [place] [address]   [place], [place] [address]   [phone]   Detox and Residential Treatment   Medicare and Private Insurance      [place]   [address]    [place], [place] [address]   28 Day Women's Facility: [phone]   28 Day Men's Facility: [phone]   Long-term Residential Program:    [phone] Males 25 and Over   (No Insurance, upfront fee)      [place]    [address]   [place], [place] [address]   [phone]   Private Insurance with [employer], Private Pay     [facility]   [address]  [place], [place] [address]  Local?[phone]   Private Insurance Only      [facility]   [address]    [place], [place] [address]    [phone]   (Males, upfront fee)      [facility]   [address]    [place], [address]   [phone]   Private Insurance                [place]  Rescue Mission Locations      [facility]    [address]    [place], [place]    [phone]   [place] Based Program for individuals experiencing homelessness   Self Pay, No insurance      [place]    Men's program: [facility]   [address] [place], [place] [address]   [phone]      [facility]   Women's program: [facility]   [address]  [place], [place] [address]   [phone]   Christian Based Program for individuals experiencing homelessness   Self Pay, No insurance      [facility]'s [facility]   [address] [place], [place] [address]    [phone]   [place] Based  Program for individuals experiencing homelessness   Self Pay, No insurance      [facility] Women's Division   [address] [place], [place] [address]   [phone]   [place] Based Program for individuals experiencing homelessness   Self Pay, No insurance      [facility]   [address] [place], [place]   [phone]   Christian Based Program for males experiencing homelessness   Self Pay, No insurance

## 2024-06-05 NOTE — Progress Notes (Signed)
 Patient down to MRI shortly after shift change. Back to room, comfortable. 1900 vitals done at 2100 due to above activity.

## 2024-06-05 NOTE — Progress Notes (Signed)
 Progress Note  Patient Name: [name] [name] Date of Encounter: 06/05/2024  Primary Cardiologist:   Ezra Shuck, MD   Subjective   The patient denies chest pain or SOB.  His daughter is worried because he is having intermittent confusion.    Inpatient Medications    Scheduled Meds:  amLODipine   5 mg Oral Daily   apixaban   5 mg Oral BID   atorvastatin   80 mg Oral Daily   carvedilol   12.5 mg Oral BID WC   empagliflozin   25 mg Oral Daily   eplerenone   25 mg Oral Daily   folic acid   1 mg Oral Daily   insulin  aspart  0-5 Units Subcutaneous QHS   insulin  aspart  0-9 Units Subcutaneous TID WC   insulin  glargine-yfgn  30 Units Subcutaneous QHS   isosorbide  mononitrate  120 mg Oral Daily   losartan   25 mg Oral Daily   multivitamin with minerals  1 tablet Oral Daily   thiamine   100 mg Oral Daily   Or   thiamine   100 mg Intravenous Daily   torsemide   40 mg Oral Daily   Continuous Infusions:  nitroGLYCERIN  20 mcg/min (06/05/24 0504)   PRN Meds: acetaminophen , albuterol , ibuprofen, LORazepam  **OR** LORazepam , melatonin   Vital Signs    Vitals:   06/04/24 2304 06/05/24 0150 06/05/24 0300 06/05/24 0730  BP: 113/81 121/85 (!) 130/91 (!) 154/94  Pulse: 87 86 62 70  Resp: 18  16 20   Temp: 97.9 F (36.6 C)  98 F (36.7 C) 97.9 F (36.6 C)  TempSrc: Oral  Oral Oral  SpO2: 95%  97% 95%  Weight:      Height:        Intake/Output Summary (Last 24 hours) at 06/05/2024 0750 Last data filed at 06/05/2024 0459 Gross per 24 hour  Intake 118 ml  Output 1425 ml  Net -1307 ml   Filed Weights   06/03/24 0904 06/03/24 1724  Weight: 88.9 kg 85.3 kg    Telemetry    Atrial fib with controlled ventricular rate - Personally Reviewed  ECG    NA - Personally Reviewed  Physical Exam   GEN: No acute distress.   Neck: No  JVD Cardiac: Irregular RR, no murmurs, rubs, or gallops.  Respiratory: Clear  to auscultation bilaterally. GI: Soft, nontender, non-distended   MS: No  edema; No deformity. Neuro:  Nonfocal  Psych: Normal affect   Labs    Chemistry Recent Labs  Lab 06/03/24 1001 06/04/24 0814  NA 134* 137  K 3.3* 3.1*  CL 105 98  CO2 22 28  GLUCOSE 234* 234*  BUN 8 9  CREATININE 1.14 1.31*  CALCIUM  8.3* 8.9  PROT 6.9 7.1  ALBUMIN 3.0* 3.0*  AST 22 15  ALT 16 14  ALKPHOS 65 60  BILITOT 1.6* 1.9*  GFRNONAA >60 59*  ANIONGAP 7 11     Hematology Recent Labs  Lab 06/03/24 1001 06/04/24 0814  WBC 6.5 6.1  RBC 5.56 5.56  HGB 16.1 16.3  HCT 49.4 48.8  MCV 88.8 87.8  MCH 29.0 29.3  MCHC 32.6 33.4  RDW 15.6* 15.3  PLT 175 186    Cardiac EnzymesNo results for input(s): TROPONINI in the last 168 hours. No results for input(s): TROPIPOC in the last 168 hours.   BNP Recent Labs  Lab 06/03/24 0938  BNP 321.4*     DDimer No results for input(s): DDIMER in the last 168 hours.   Radiology  CT HEAD WO CONTRAST ( ) Result Date: 06/04/2024 EXAM: CT HEAD WITHOUT CONTRAST 06/04/2024 11:45:00 AM TECHNIQUE: CT of the head was performed without the administration of intravenous contrast. Automated exposure control, iterative reconstruction, and/or weight based adjustment of the mA/kV was utilized to reduce the radiation dose to as low as reasonably achievable. COMPARISON: Head CT 11/24/2023 and MRI 08/22/2023. CLINICAL HISTORY: Headache, sudden, severe; Hypertensive emergency. FINDINGS: BRAIN AND VENTRICLES: There is no evidence of an acute infarct, intracranial hemorrhage, mass, midline shift, hydrocephalus, or extra-axial fluid collection. Patchy cerebral white matter hypodensities are similar to the prior CT and nonspecific but compatible with moderate chronic small vessel ischemic disease. There is mild chronic heterogeneity in the deep gray nuclei. There is mild cerebral atrophy. A small chronic left cerebellar infarct is unchanged. Calcified atherosclerosis at the skull base. ORBITS: No acute abnormality. SINUSES: Mucosal  thickening in the paranasal sinuses. Clear mastoid air cells. SOFT TISSUES AND SKULL: No acute soft tissue abnormality. No skull fracture. IMPRESSION: 1. No acute intracranial abnormality. 2. Moderate chronic small vessel ischemic disease. Electronically signed by: Dasie Hamburg MD 06/04/2024 11:58 AM EDT RP Workstation: [id number]   DG Chest Port 1 View Result Date: 06/03/2024 CLINICAL DATA:  Left-sided chest pain and shortness of breath since last night. EXAM: PORTABLE CHEST 1 VIEW COMPARISON:  01/02/2024 FINDINGS: Lungs are adequately inflated demonstrate bilateral minimal hazy prominence of the central pulmonary vessels suggesting mild vascular congestion. No lobar consolidation or significant effusion. Stable borderline cardiomegaly. Remainder of the exam is unchanged. IMPRESSION: Borderline cardiomegaly with suggestion of mild vascular congestion. Electronically Signed   By: Toribio Agreste M.D.   On: 06/03/2024 10:16    Cardiac Studies   NA  Patient Profile     68 y.o. male with a hx of hypertension, diabetes, PAF, CAD, CHF, poorly controlled type 2 diabetes, CKD, history of GI who is being seen 06/04/2024 for the evaluation of hypertensive emergency at the request of Dr. Mosie.   Assessment & Plan    Hypertensive urgency:  BP is much improved.   NTG IV weaned to off.  Added Norvasc  and will increase to 10 mg this morning.  I am going to change Coreg  to Torpol XL.  Continue Losartan , Imdur  and eplerenone .  He is off of hydral in favor of once daily drugs.  We could consider Catapress patch in the future for compliance.    AKI:  Creat is pending.  Trend renal function before increasing ARB.   Elevated trop:  Likely related to hypertensive urgency.  Atrial fib:  Seems to be persistent.  On Eliquis .  Not able to cardiovert given non compliance with meds.    Chronic HFpEF:  He seems to be euvolemic.  Continue meds as above and also on Jardiance  and Torsemide .    For questions or  updates, please contact [facility] Please consult [email or web address] for contact info under Cardiology/STEMI.   Signed, Lynwood Schilling, MD  06/05/2024, 7:50 AM

## 2024-06-05 NOTE — Progress Notes (Signed)
 Progress Note   Patient: [name] [name] FMW:[id number] DOB: [birth year] DOA: 06/03/2024     2 DOS: the patient was seen and examined on 06/05/2024   Brief hospital course:  68 y.o. male with medical history significant of hypertension, atrial fibrillation on apixaban , hyperlipidemia, poorly controlled type 2 diabetes, CKD (baseline creatinine appears to be 1.4-1.6), and recent admission in 08/2023 for expressive aphasia 2/2 TIA/HTN emergency p/w hypertensive emergency.   The patient reported experiencing shortness of breath and inability to sleep the previous night, stating they were up all night fighting for air. The patient mentioned attempting to alleviate the symptoms by sitting outside on the porch multiple times but was unable to obtain relief. The patient decided to seek medical help in the morning and called their daughter around eight o'clock. The patient indicated missing several medications but was unsure of which ones or how many days they had missed.   In the ED, the patient was hypertensive to 170-190s and tachypneic to 27 on RA. Labs notable for  K 3.3, BNP 321, and troponin 81 > 75. EDP started IV lasix  20mg  x1 and nitroglycerin  gtt and requested medicine admission.  Assessment and Plan:  # Hypertensive emergency  - Presented with elevated blood pressures, noted as high as 217/125 in the setting of poor medication compliance. Reports taking his medications 2-3 days a week only.  - Associated symptoms included chest discomfort, shortness of breath and headache - Was started on nitroglycerin  drip in the ED, unsuccessful attempts at weaning down overnight - Home antihypertensives include losartan  25 mg daily, hydralazine  50 mg twice daily, eplerenone  25 mg daily, Imdur  120 mg daily, torsemide  40 mg twice daily - Cardiolgy consulted for assistance with medication management/consolidation to aid with better outpatient adherence - Currently on nitroglycerin  drip, will attempt to  wean off today - Continue losartan  25 mg daily, Imdur  120 mg daily, eplerenone  25 mg daily, Torsemide  40 mg daily - Increase amlodipine  10 mg daily - Coreg  switched to Toprol XL 25 mg daily  # Right-sided headache - improved - Improving with better BP control - No appreciable neurologic deficits on exam although he reports blurry vision that is not new  - CT head w/o contrast ordered to rule out any intracranial bleeding  #Confusion #? Anomic aphasia - Patient's daughter reported several episodes of confusion and word-searching today, which is unusual for him and consistent with deficits from TIA in 08/2023 - MRI brain ordered to rule out acute stroke  # Elevated creatinine  - Cr elevated to 1.31 from 1.14 on admission. However, baseline Cr appears to be around 1.40s most recently - Will continue to monitor   # HFpEF (LVEF 60-65%) - Was recently seen in the cardiology clinic. Given his medical noncompliance, he reports DOE, 3 pillow orthopnea, frequent PND necessitating him to walk out to his front porch for relief - BNP on admission 321 - CXR showed findings consistent with mild vascular congestion and stable borderline cardiomegaly.  - Received IV Lasix  20 mg once in the ED - Cardiology following - Continue home Jardiance  25 mg daily, plan on 25 mg daily, torsemide  40 mg daily  # Hypokalemia - K 3.3  - Repleted  # Paroxysmal Afib/flutter - Currently rate controled without medications. Per cardiology notes, he is no longer on amiodarone  or sotalol  - Continue home Eliquis  BID - Coreg  switch to Toprol-XL 25 mg daily to aid with compliance   # T2DM - Hgb A1c 8.4 (06/04/2024) - Follows with Dr.  Motwani with outpatient endocrinology - Outpatient Diabetes medications: Lantus  30 units every day, Humalog  0-10 units TID, Jardiance  25 mg every day, Metformin  1 gram with breakfast, Freestyle Libre 3  - Diabetes educator following  - Continue Semglee  30 units nightly and SSI ACHS  #  Chronic alcohol use - Has prior history of alcohol abuse, more recently drinks 1 beer a day with up to 3-4 beers plus 1-2 drinks of liquor while watching football on weekends - CIWA protocol initiated - Thiamine , folate, and multivitamin supplementation - TOC consulted for alcohol cessation counseling     Subjective: Patient seen at bedside this morning. Patient's daughter, [name], at bedside.  Patient was doing well with significant proving his headache.  However, his daughter is concerned about several episodes of intermittent confusion today including not being able to name the president is as well as several episodes of word searching which she reports are similar to his symptoms when he had a TIA and January 2025.  He denies any chest pain, shortness of breath, abdominal pain, fevers, chills, blurry vision, dizziness.  Physical Exam: Vitals:   06/04/24 2304 06/05/24 0150 06/05/24 0300 06/05/24 0730  BP: 113/81 121/85 (!) 130/91 (!) 154/94  Pulse: 87 86 62 70  Resp: 18  16 20   Temp: 97.9 F (36.6 C)  98 F (36.7 C) 97.9 F (36.6 C)  TempSrc: Oral  Oral Oral  SpO2: 95%  97% 95%  Weight:      Height:       Physical Exam Constitutional:      General: He is not in acute distress.    Appearance: He is not ill-appearing.  Cardiovascular:     Rate and Rhythm: Normal rate. Rhythm irregularly irregular.     Heart sounds: Normal heart sounds. No murmur heard. Pulmonary:     Effort: Pulmonary effort is normal. No respiratory distress.     Breath sounds: Normal breath sounds.  Abdominal:     General: Bowel sounds are normal.     Palpations: Abdomen is soft.     Tenderness: There is no abdominal tenderness.  Musculoskeletal:     Right lower leg: No edema.     Left lower leg: No edema.  Skin:    General: Skin is warm and dry.     Capillary Refill: Capillary refill takes less than 2 seconds.  Neurological:     Mental Status: He is alert and oriented to person, place, and time.  Mental status is at baseline.     Comments: AAOx3 on my exam     Family Communication: Discussed with patient's daughter, [name], at bedside  Disposition: Status is: Inpatient Remains inpatient appropriate because: requires titration off nitroglycerin  drip and transition to stable oral antiHTN regimen  Planned Discharge Destination: Home    Time spent: 42 minutes  Author: Duffy Larch, MD 06/05/2024 8:24 AM  For on call review [email or web address].

## 2024-06-05 NOTE — Care Management Important Message (Signed)
 Important Message  Patient Details  Name: [name] [name] MRN: [id number] Date of Birth: 1955/10/27   Important Message Given:  Yes - Medicare IM     Claretta Deed 06/05/2024, 1:37 PM

## 2024-06-05 NOTE — Plan of Care (Signed)
  Problem: Education: Goal: Knowledge of General Education information will improve Description: Including pain rating scale, medication(s)/side effects and non-pharmacologic comfort measures Outcome: Progressing   Problem: Activity: Goal: Risk for activity intolerance will decrease Outcome: Progressing   Problem: Nutrition: Goal: Adequate nutrition will be maintained Outcome: Progressing   Problem: Coping: Goal: Level of anxiety will decrease Outcome: Progressing   Problem: Metabolic: Goal: Ability to maintain appropriate glucose levels will improve Outcome: Progressing

## 2024-06-06 ENCOUNTER — Telehealth: Payer: Self-pay | Admitting: Physician Assistant

## 2024-06-06 ENCOUNTER — Inpatient Hospital Stay (HOSPITAL_COMMUNITY)

## 2024-06-06 DIAGNOSIS — N1831 Chronic kidney disease, stage 3a: Secondary | ICD-10-CM | POA: Diagnosis not present

## 2024-06-06 DIAGNOSIS — I6340 Cerebral infarction due to embolism of unspecified cerebral artery: Secondary | ICD-10-CM | POA: Diagnosis not present

## 2024-06-06 DIAGNOSIS — I161 Hypertensive emergency: Secondary | ICD-10-CM | POA: Diagnosis not present

## 2024-06-06 DIAGNOSIS — R29703 NIHSS score 3: Secondary | ICD-10-CM | POA: Diagnosis not present

## 2024-06-06 DIAGNOSIS — F1090 Alcohol use, unspecified, uncomplicated: Secondary | ICD-10-CM

## 2024-06-06 DIAGNOSIS — N281 Cyst of kidney, acquired: Secondary | ICD-10-CM | POA: Diagnosis not present

## 2024-06-06 DIAGNOSIS — I4891 Unspecified atrial fibrillation: Secondary | ICD-10-CM | POA: Diagnosis not present

## 2024-06-06 DIAGNOSIS — I422 Other hypertrophic cardiomyopathy: Secondary | ICD-10-CM

## 2024-06-06 DIAGNOSIS — R590 Localized enlarged lymph nodes: Secondary | ICD-10-CM | POA: Diagnosis not present

## 2024-06-06 DIAGNOSIS — N179 Acute kidney failure, unspecified: Secondary | ICD-10-CM | POA: Diagnosis not present

## 2024-06-06 DIAGNOSIS — I10 Essential (primary) hypertension: Secondary | ICD-10-CM

## 2024-06-06 DIAGNOSIS — E1151 Type 2 diabetes mellitus with diabetic peripheral angiopathy without gangrene: Secondary | ICD-10-CM

## 2024-06-06 DIAGNOSIS — I2510 Atherosclerotic heart disease of native coronary artery without angina pectoris: Secondary | ICD-10-CM | POA: Diagnosis not present

## 2024-06-06 DIAGNOSIS — I4819 Other persistent atrial fibrillation: Secondary | ICD-10-CM

## 2024-06-06 DIAGNOSIS — R410 Disorientation, unspecified: Secondary | ICD-10-CM | POA: Diagnosis not present

## 2024-06-06 DIAGNOSIS — J929 Pleural plaque without asbestos: Secondary | ICD-10-CM | POA: Diagnosis not present

## 2024-06-06 LAB — CBC WITH DIFFERENTIAL/PLATELET
Abs Immature Granulocytes: 0.04 K/uL (ref 0.00–0.07)
Basophils Absolute: 0.1 K/uL (ref 0.0–0.1)
Basophils Relative: 1 %
Eosinophils Absolute: 0.1 K/uL (ref 0.0–0.5)
Eosinophils Relative: 2 %
HCT: 44.3 % (ref 39.0–52.0)
Hemoglobin: 14.5 g/dL (ref 13.0–17.0)
Immature Granulocytes: 1 %
Lymphocytes Relative: 29 %
Lymphs Abs: 2.3 K/uL (ref 0.7–4.0)
MCH: 29.2 pg (ref 26.0–34.0)
MCHC: 32.7 g/dL (ref 30.0–36.0)
MCV: 89.1 fL (ref 80.0–100.0)
Monocytes Absolute: 0.8 K/uL (ref 0.1–1.0)
Monocytes Relative: 10 %
Neutro Abs: 4.8 K/uL (ref 1.7–7.7)
Neutrophils Relative %: 57 %
Platelets: 174 K/uL (ref 150–400)
RBC: 4.97 MIL/uL (ref 4.22–5.81)
RDW: 15.5 % (ref 11.5–15.5)
WBC: 8.2 K/uL (ref 4.0–10.5)
nRBC: 0 % (ref 0.0–0.2)

## 2024-06-06 LAB — CBC
HCT: 43.2 % (ref 39.0–52.0)
Hemoglobin: 14 g/dL (ref 13.0–17.0)
MCH: 28.7 pg (ref 26.0–34.0)
MCHC: 32.4 g/dL (ref 30.0–36.0)
MCV: 88.5 fL (ref 80.0–100.0)
Platelets: 174 K/uL (ref 150–400)
RBC: 4.88 MIL/uL (ref 4.22–5.81)
RDW: 15.4 % (ref 11.5–15.5)
WBC: 8.9 K/uL (ref 4.0–10.5)
nRBC: 0 % (ref 0.0–0.2)

## 2024-06-06 LAB — MAGNESIUM: Magnesium: 2.2 mg/dL (ref 1.7–2.4)

## 2024-06-06 LAB — COMPREHENSIVE METABOLIC PANEL WITH GFR
ALT: 10 U/L (ref 0–44)
ALT: 22 U/L (ref 0–44)
AST: 12 U/L — ABNORMAL LOW (ref 15–41)
AST: 30 U/L (ref 15–41)
Albumin: 2.7 g/dL — ABNORMAL LOW (ref 3.5–5.0)
Albumin: 2.8 g/dL — ABNORMAL LOW (ref 3.5–5.0)
Alkaline Phosphatase: 52 U/L (ref 38–126)
Alkaline Phosphatase: 71 U/L (ref 38–126)
Anion gap: 11 (ref 5–15)
Anion gap: 11 (ref 5–15)
BUN: 19 mg/dL (ref 8–23)
BUN: 17 mg/dL (ref 8–23)
CO2: 27 mmol/L (ref 22–32)
CO2: 22 mmol/L (ref 22–32)
Calcium: 9 mg/dL (ref 8.9–10.3)
Calcium: 9 mg/dL (ref 8.9–10.3)
Chloride: 101 mmol/L (ref 98–111)
Chloride: 103 mmol/L (ref 98–111)
Creatinine, Ser: 1.91 mg/dL — ABNORMAL HIGH (ref 0.61–1.24)
Creatinine, Ser: 1.46 mg/dL — ABNORMAL HIGH (ref 0.61–1.24)
GFR, Estimated: 37 mL/min — ABNORMAL LOW (ref >60)
GFR, Estimated: 51 mL/min — ABNORMAL LOW (ref >60)
Glucose, Bld: 70 mg/dL (ref 70–99)
Glucose, Bld: 94 mg/dL (ref 70–99)
Potassium: 3.1 mmol/L — ABNORMAL LOW (ref 3.5–5.1)
Potassium: 3.7 mmol/L (ref 3.5–5.1)
Sodium: 139 mmol/L (ref 135–145)
Sodium: 136 mmol/L (ref 135–145)
Total Bilirubin: 0.8 mg/dL (ref 0.0–1.2)
Total Bilirubin: 1.1 mg/dL (ref 0.0–1.2)
Total Protein: 6.1 g/dL — ABNORMAL LOW (ref 6.5–8.1)
Total Protein: 6.4 g/dL — ABNORMAL LOW (ref 6.5–8.1)

## 2024-06-06 LAB — URINALYSIS, ROUTINE W REFLEX MICROSCOPIC
Bacteria, UA: NONE SEEN
Bilirubin Urine: NEGATIVE
Glucose, UA: >=500 mg/dL — AB
Hgb urine dipstick: NEGATIVE
Ketones, ur: 5 mg/dL — AB
Leukocytes,Ua: NEGATIVE
Nitrite: NEGATIVE
Protein, ur: 100 mg/dL — AB
Specific Gravity, Urine: 1.022 (ref 1.005–1.030)
pH: 7 (ref 5.0–8.0)

## 2024-06-06 LAB — RAPID URINE DRUG SCREEN, HOSP PERFORMED
Amphetamines: NOT DETECTED
Barbiturates: NOT DETECTED
Benzodiazepines: NOT DETECTED
Cocaine: NOT DETECTED
Opiates: NOT DETECTED
Tetrahydrocannabinol: POSITIVE — AB

## 2024-06-06 LAB — PROTEIN / CREATININE RATIO, URINE
Creatinine, Urine: 63 mg/dL
Protein Creatinine Ratio: 1.24 mg/mg{creat} — ABNORMAL HIGH (ref 0.00–0.15)
Total Protein, Urine: 78 mg/dL

## 2024-06-06 LAB — GLUCOSE, CAPILLARY
Glucose-Capillary: 112 mg/dL — ABNORMAL HIGH (ref 70–99)
Glucose-Capillary: 147 mg/dL — ABNORMAL HIGH (ref 70–99)
Glucose-Capillary: 150 mg/dL — ABNORMAL HIGH (ref 70–99)
Glucose-Capillary: 193 mg/dL — ABNORMAL HIGH (ref 70–99)
Glucose-Capillary: 85 mg/dL (ref 70–99)
Glucose-Capillary: 95 mg/dL (ref 70–99)

## 2024-06-06 LAB — LACTIC ACID, PLASMA: Lactic Acid, Venous: 1 mmol/L (ref 0.5–1.9)

## 2024-06-06 LAB — AMMONIA: Ammonia: <13 umol/L (ref 9–35)

## 2024-06-06 LAB — SODIUM, URINE, RANDOM: Sodium, Ur: 49 mmol/L

## 2024-06-06 LAB — TSH: TSH: 0.934 u[IU]/mL (ref 0.350–4.500)

## 2024-06-06 LAB — PHOSPHORUS: Phosphorus: 3.6 mg/dL (ref 2.5–4.6)

## 2024-06-06 LAB — VITAMIN B12: Vitamin B-12: 575 pg/mL (ref 180–914)

## 2024-06-06 LAB — CREATININE, URINE, RANDOM: Creatinine, Urine: 62 mg/dL

## 2024-06-06 MED ORDER — LACTATED RINGERS IV SOLN: INTRAVENOUS | Status: DC

## 2024-06-06 MED ORDER — INSULIN GLARGINE-YFGN 100 UNIT/ML ~~LOC~~ SOLN: 25.0000 [IU] | Freq: Every day | SUBCUTANEOUS | Status: DC

## 2024-06-06 MED ORDER — POTASSIUM CHLORIDE CRYS ER 20 MEQ PO TBCR
40.0000 meq | EXTENDED_RELEASE_TABLET | Freq: Once | ORAL | Status: AC
  Administered 2024-06-06: 40 meq via ORAL
  Filled 2024-06-06: qty 2

## 2024-06-06 MED ORDER — INSULIN GLARGINE-YFGN 100 UNIT/ML ~~LOC~~ SOLN: 20.0000 [IU] | Freq: Every day | SUBCUTANEOUS | Status: DC

## 2024-06-06 MED ORDER — AMLODIPINE BESYLATE 5 MG PO TABS
5.0000 mg | ORAL_TABLET | Freq: Every day | ORAL | Status: DC
  Administered 2024-06-06 – 2024-06-07 (×2): 5 mg via ORAL
  Filled 2024-06-06 (×2): qty 1

## 2024-06-06 MED ORDER — SODIUM CHLORIDE 0.9 % IV SOLN: 250.0000 mg | Freq: Every day | INTRAVENOUS | Status: DC

## 2024-06-06 MED ORDER — INSULIN GLARGINE-YFGN 100 UNIT/ML ~~LOC~~ SOLN
20.0000 [IU] | Freq: Every day | SUBCUTANEOUS | Status: DC
  Administered 2024-06-06 – 2024-06-07 (×2): 20 [IU] via SUBCUTANEOUS
  Filled 2024-06-06 (×3): qty 0.2

## 2024-06-06 MED ORDER — ONDANSETRON 4 MG PO TBDP
4.0000 mg | ORAL_TABLET | Freq: Once | ORAL | Status: DC | PRN
  Filled 2024-06-06: qty 1

## 2024-06-06 MED ORDER — THIAMINE HCL 100 MG/ML IJ SOLN: 100.0000 mg | Freq: Every day | INTRAMUSCULAR | Status: DC

## 2024-06-06 MED ORDER — POLYETHYLENE GLYCOL 3350 17 G PO PACK
17.0000 g | PACK | Freq: Every day | ORAL | Status: DC | PRN
  Filled 2024-06-06: qty 1

## 2024-06-06 MED ORDER — THIAMINE HCL 100 MG/ML IJ SOLN
500.0000 mg | Freq: Three times a day (TID) | INTRAVENOUS | Status: DC
  Administered 2024-06-06 – 2024-06-08 (×5): 500 mg via INTRAVENOUS
  Filled 2024-06-06 (×4): qty 5
  Filled 2024-06-06: qty 500
  Filled 2024-06-06: qty 5

## 2024-06-06 MED ORDER — AMLODIPINE BESYLATE 10 MG PO TABS
10.0000 mg | ORAL_TABLET | Freq: Every day | ORAL | Status: DC
Start: 2024-06-06 — End: 2024-06-06

## 2024-06-06 NOTE — Consult Note (Signed)
 NEUROLOGY CONSULT NOTE   Date of service: June 06, 2024 Patient Name: [name] [name] MRN:  [id number] DOB:  03-22-1956 Chief Complaint: confusion Requesting Provider: Al-Sultani, Anmar, MD  History of Present Illness  [name] [name] is a 68 y.o. male with hx of CAD, Afibb but non compliant with eliquis , daily alcohol use, HTN but noncompliant with meds, DM2, OSA who presented to the ED with shortness of breath, unableto lie flat. He was admitted with Hypertensive emergency, AKI, HFpEF. He became more confused thou intermittently over the last day or 2 and today had an hour of lucidity but intermittent confusion.  AKI is improved, HTN is better controlled with meds. He drinks a beer a night but more during weekend and football season.  He had MRI brain which demonstrated embolic appearing strokes.  Eats mostly fast food, misses medications several days in a week.  LKW: unclear Modified rankin score: 0-Completely asymptomatic and back to baseline post- stroke IV Thrombolysis: not offered, unclear LWK EVT: not offered, no LVO signs.  NIHSS components Score: Comment  1a Level of Conscious 0[]  1[x]  2[]  3[]      1b LOC Questions 0[]  1[x]  2[]       1c LOC Commands 0[x]  1[]  2[]       2 Best Gaze 0[x]  1[]  2[]       3 Visual 0[x]  1[]  2[]  3[]      4 Facial Palsy 0[x]  1[]  2[]  3[]      5a Motor Arm - left 0[x]  1[]  2[]  3[]  4[]  UN[]    5b Motor Arm - Right 0[x]  1[]  2[]  3[]  4[]  UN[]    6a Motor Leg - Left 0[x]  1[]  2[]  3[]  4[]  UN[]    6b Motor Leg - Right 0[x]  1[]  2[]  3[]  4[]  UN[]    7 Limb Ataxia 0[x]  1[]  2[]  UN[]      8 Sensory 0[x]  1[]  2[]  UN[]      9 Best Language 0[]  1[x]  2[]  3[]      10 Dysarthria 0[x]  1[]  2[]  UN[]      11 Extinct. and Inattention 0[x]  1[]  2[]       TOTAL: 3      ROS  Unable to ascertain due to confusion.  Past History   Past Medical History:  Diagnosis Date   Anxiety    CAD (coronary artery disease), native coronary artery    a. Nonobstructive CAD by cath  2013 - diffuse distal and branch vessel CAD, no severe disease in the major coronaries, LV mild global hypokinesis, EF 45%. b. ETT-Sestamibi 5/14: EF 31%, small fixed inferior defect with no ischemia.   Chronic CHF (HCC)    a. Mixed ICM/NICM (?EtOH). EF 35% in 2008. Echo 5/13: EF 60-65%, mod LVH, EF 45% on V gram in 12/2011. EF 12/2012: EF 50-55%, mild LVH, inferobasal HK, mild MR. ETT-Ses 5/14 EF 41%. Cardiac MRI 5/14: EF 44%, mild global HK, subepicardial delayed enhancement in nonspecific RV insertion pattern.   COLONIC POLYPS, HX OF 12/30/2006   Gout    H/O atrial flutter 08/13/2005   a. Ablations in 2007, 2008.   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   PAF (paroxysmal atrial fibrillation) (HCC)  On coumadin    Sleep apnea    suppose to send mask but they never did (05/03/2015)   Type II diabetes mellitus [facility])     Past Surgical History:  Procedure Laterality Date   A FLUTTER ABLATION  2007, 2008   catheter ablation    CARDIAC CATHETERIZATION N/A 05/03/2015   Procedure: Right/Left Heart Cath and Coronary Angiography;  Surgeon: Ezra GORMAN Shuck, MD;  Location: [facility] INVASIVE CV LAB;  Service: Cardiovascular;  Laterality: N/A;   CARDIAC CATHETERIZATION N/A 05/03/2015   Procedure: Coronary Stent Intervention;  Surgeon: Candyce GORMAN Reek, MD;  Location: [facility] INVASIVE CV LAB;  Service: Cardiovascular;  Laterality: N/A;   CARDIAC CATHETERIZATION N/A 11/04/2015   Procedure: Left Heart Cath and Coronary Angiography;  Surgeon: Ezra GORMAN Shuck, MD;  Location: [facility] INVASIVE CV LAB;  Service: Cardiovascular;  Laterality: N/A;   CARDIOVERSION N/A 08/24/2015    Procedure: CARDIOVERSION;  Surgeon: Aleene JINNY Passe, MD;  Location: [facility] ENDOSCOPY;  Service: Cardiovascular;  Laterality: N/A;   CARDIOVERSION N/A 12/14/2022   Procedure: CARDIOVERSION;  Surgeon: Shuck Ezra GORMAN, MD;  Location: [facility] INVASIVE CV LAB;  Service: Cardiovascular;  Laterality: N/A;   CARDIOVERSION N/A 10/02/2023   Procedure: CARDIOVERSION;  Surgeon: Shuck Ezra GORMAN, MD;  Location: [facility] INVASIVE CV LAB;  Service: Cardiovascular;  Laterality: N/A;   CHOLECYSTECTOMY N/A 02/07/2016   Procedure: LAPAROSCOPIC CHOLECYSTECTOMY WITH  INTRAOPERATIVE CHOLANGIOGRAM;  Surgeon: Camellia Blush, MD;  Location: [facility] ORS;  Service: General;  Laterality: N/A;   COLONOSCOPY WITH PROPOFOL  N/A 04/28/2019   Procedure: COLONOSCOPY WITH PROPOFOL ;  Surgeon: Saintclair Jasper, MD;  Location: [facility] ENDOSCOPY;  Service: Gastroenterology;  Laterality: N/A;   CORONARY ANGIOPLASTY  12/05/01   ESOPHAGOGASTRODUODENOSCOPY (EGD) WITH PROPOFOL  N/A 04/27/2019   Procedure: ESOPHAGOGASTRODUODENOSCOPY (EGD) WITH PROPOFOL ;  Surgeon: Saintclair Jasper, MD;  Location: [facility] ENDOSCOPY;  Service: Gastroenterology;  Laterality: N/A;   GIVENS CAPSULE STUDY N/A 06/29/2019   Procedure: GIVENS CAPSULE STUDY;  Surgeon: Dianna Specking, MD;  Location: [facility] ENDOSCOPY;  Service: Endoscopy;  Laterality: N/A;   LEFT HEART CATH AND CORONARY ANGIOGRAPHY N/A 12/28/2016   Procedure: Left Heart Cath and Coronary Angiography;  Surgeon: Shuck Ezra GORMAN, MD;  Location: [facility] INVASIVE CV LAB;  Service: Cardiovascular;  Laterality: N/A;   LEFT HEART CATHETERIZATION WITH CORONARY ANGIOGRAM N/A 12/19/2011   Procedure: LEFT HEART CATHETERIZATION WITH CORONARY ANGIOGRAM;  Surgeon: Ezra GORMAN Shuck, MD;  Location: [facility] CATH LAB;  Service: Cardiovascular;  Laterality: N/A;   OSTEOCHONDROMA EXCISION Left 1972   took bone tumor off my shoulder   POLYPECTOMY  04/28/2019   Procedure: POLYPECTOMY;  Surgeon: Saintclair Jasper, MD;  Location: [facility] ENDOSCOPY;  Service: Gastroenterology;;   PRESSURE  SENSOR/CARDIOMEMS N/A 02/01/2020   Procedure: PRESSURE SENSOR/CARDIOMEMS;  Surgeon: Shuck Ezra GORMAN, MD;  Location: [facility] INVASIVE CV LAB;  Service: Cardiovascular;  Laterality: N/A;   RIGHT HEART CATH N/A 02/01/2020   Procedure: RIGHT HEART CATH;  Surgeon: Shuck Ezra GORMAN, MD;  Location: [facility] INVASIVE CV LAB;  Service: Cardiovascular;  Laterality: N/A;   RIGHT/LEFT HEART CATH AND CORONARY ANGIOGRAPHY N/A 07/01/2019   Procedure: RIGHT/LEFT HEART CATH AND CORONARY ANGIOGRAPHY;  Surgeon: Shuck Ezra GORMAN, MD;  Location: [facility] INVASIVE CV LAB;  Service: Cardiovascular;  Laterality: N/A;   TEE WITHOUT CARDIOVERSION N/A 12/14/2022   Procedure: TRANSESOPHAGEAL ECHOCARDIOGRAM;  Surgeon: Shuck Ezra GORMAN, MD;  Location: [facility] INVASIVE CV LAB;  Service: Cardiovascular;  Laterality: N/A;   TRANSESOPHAGEAL ECHOCARDIOGRAM (CATH LAB) N/A 10/02/2023   Procedure: TRANSESOPHAGEAL ECHOCARDIOGRAM;  Surgeon: Shuck Ezra GORMAN,  MD;  Location: [facility] INVASIVE CV LAB;  Service: Cardiovascular;  Laterality: N/A;    Family History: Family History  Problem Relation Age of Onset   Diabetes Mother    Hypertension Mother    Heart attack Neg Hx    Stroke Neg Hx     Social History  reports that he quit smoking about 40 years ago. His smoking use included cigarettes. He started smoking about 58 years ago. He has a 72 pack-year smoking history. He has never used smokeless tobacco. He reports current alcohol use of about 16.0 standard drinks of alcohol per week. He reports current drug use. Drug: Marijuana.  Allergies  Allergen Reactions   Shrimp [Shellfish Allergy] Nausea And Vomiting   Ace Inhibitors Cough   Other Hives and Other (See Comments)    Patient reports developing hives after receiving some antibiotic given in 1980''s at [facility]. He does not know which antibiotic.    Medications   Current Facility-Administered Medications:    acetaminophen  (TYLENOL ) tablet 650 mg, 650 mg, Oral, Q6H PRN, Rathore, Vasundhra,  MD, 650 mg at 06/06/24 1530   albuterol  (PROVENTIL ) (2.5 MG/3ML) 0.083% nebulizer solution 2.5 mg, 2.5 mg, Inhalation, Q6H PRN, Georgina Basket, MD   amLODipine  (NORVASC ) tablet 5 mg, 5 mg, Oral, Daily, Al-Sultani, Anmar, MD, 5 mg at 06/06/24 9045   apixaban  (ELIQUIS ) tablet 5 mg, 5 mg, Oral, BID, Georgina Basket, MD, 5 mg at 06/06/24 9045   atorvastatin  (LIPITOR ) tablet 80 mg, 80 mg, Oral, Daily, Georgina Basket, MD, 80 mg at 06/06/24 9045   empagliflozin  (JARDIANCE ) tablet 25 mg, 25 mg, Oral, Daily, Johnson, Kathleen R, PA-C, 25 mg at 06/06/24 9045   eplerenone  (INSPRA ) tablet 25 mg, 25 mg, Oral, Daily, Johnson, Kathleen R, PA-C, 25 mg at 06/06/24 9045   folic acid  (FOLVITE ) tablet 1 mg, 1 mg, Oral, Daily, Al-Sultani, Anmar, MD, 1 mg at 06/06/24 9045   insulin  aspart (novoLOG ) injection 0-5 Units, 0-5 Units, Subcutaneous, QHS, Al-Sultani, Anmar, MD, 2 Units at 06/05/24 2117   insulin  aspart (novoLOG ) injection 0-9 Units, 0-9 Units, Subcutaneous, TID WC, Al-Sultani, Anmar, MD, 1 Units at 06/06/24 1210   insulin  glargine-yfgn (SEMGLEE ) injection 20 Units, 20 Units, Subcutaneous, QHS, Al-Sultani, Anmar, MD   isosorbide  mononitrate (IMDUR ) 24 hr tablet 120 mg, 120 mg, Oral, Daily, Vicci Rollo SAUNDERS, PA-C, 120 mg at 06/06/24 9045   lactated ringers  infusion, , Intravenous, Continuous, Dennise Hoes, MD, Last Rate: 40 mL/hr at 06/06/24 1500, Infusion Verify at 06/06/24 1500   LORazepam  (ATIVAN ) tablet 1-4 mg, 1-4 mg, Oral, Q1H PRN, 1 mg at 06/04/24 1233 **OR** LORazepam  (ATIVAN ) injection 1-4 mg, 1-4 mg, Intravenous, Q1H PRN, Al-Sultani, Anmar, MD   losartan  (COZAAR ) tablet 25 mg, 25 mg, Oral, Daily, Johnson, Kathleen R, PA-C, 25 mg at 06/06/24 9045   melatonin tablet 5 mg, 5 mg, Oral, QHS PRN, Rathore, Vasundhra, MD, 5 mg at 06/05/24 2121   metoprolol succinate (TOPROL-XL) 24 hr tablet 25 mg, 25 mg, Oral, Daily, Hochrein, James, MD, 25 mg at 06/06/24 9043   multivitamin with minerals tablet 1 tablet, 1  tablet, Oral, Daily, Al-Sultani, Anmar, MD, 1 tablet at 06/06/24 0956   nitroGLYCERIN  50 mg in dextrose  5 % 250 mL (0.2 mg/mL) infusion, 0-200 mcg/min, Intravenous, Continuous, Jerrol Agent, MD, Stopped at 06/05/24 1351   ondansetron  (ZOFRAN -ODT) disintegrating tablet 4 mg, 4 mg, Oral, Once PRN, Rathore, Vasundhra, MD   potassium chloride  SA (KLOR-CON  M) CR tablet 40 mEq, 40 mEq, Oral, Once, Rathore, Vasundhra,  MD   thiamine  (VITAMIN B1) 500 mg in sodium chloride  0.9 % 50 mL IVPB, 500 mg, Intravenous, Q8H **FOLLOWED BY** [START ON 06/09/2024] thiamine  (VITAMIN B1) 250 mg in sodium chloride  0.9 % 50 mL IVPB, 250 mg, Intravenous, Daily **FOLLOWED BY** [START ON 06/15/2024] thiamine  (VITAMIN B1) injection 100 mg, 100 mg, Intravenous, Daily, Miloh Alcocer, MD  Vitals   Vitals:   06/06/24 0700 06/06/24 0838 06/06/24 1202 06/06/24 1607  BP:  (!) 171/72 (!) 153/95 (!) 144/73  Pulse: 62  63 66  Resp: 20  18 16   Temp: 97.8 F (36.6 C)  98.9 F (37.2 C) 98 F (36.7 C)  TempSrc: Axillary  Oral Oral  SpO2: 98%  95% 94%  Weight:      Height:        Body mass index is 25.5 kg/m.   Physical Exam   General: Laying comfortably in bed; in no acute distress.  HENT: Normal oropharynx and mucosa. Normal external appearance of ears and nose.  Neck: Supple, no pain or tenderness  CV: No JVD. No peripheral edema.  Pulmonary: Symmetric Chest rise. Normal respiratory effort.  Abdomen: Soft to touch, non-tender.  Ext: No cyanosis, edema, or deformity  Skin: No rash. Normal palpation of skin.   Musculoskeletal: Normal digits and nails by inspection. No clubbing.   Neurologic Examination  Mental status/Cognition: drowsy, dozing off, oriented to self, place, but not to month and year, poor attention but I also suspect underlying executive dysfunction. Speech/language: Fluent, comprehension intact, object naming intact, repetition intact.  Cranial nerves:   CN II Pupils equal and reactive to light,  no VF deficits    CN III,IV,VI EOM intact, no gaze preference or deviation, no nystagmus    CN V normal sensation in V1, V2, and V3 segments bilaterally    CN VII no asymmetry, no nasolabial fold flattening    CN VIII normal hearing to speech    CN IX & X normal palatal elevation, no uvular deviation    CN XI 5/5 head turn and 5/5 shoulder shrug bilaterally    CN XII midline tongue protrusion    Motor:  Muscle bulk: normal, tone normal Mvmt Root Nerve  Muscle Right Left Comments  SA C5/6 Ax Deltoid     EF C5/6 Mc Biceps     EE C6/7/8 Rad Triceps     WF C6/7 Med FCR     WE C7/8 PIN ECU     F Ab C8/T1 U ADM/FDI 5 5   HF L1/2/3 Fem Illopsoas     KE L2/3/4 Fem Quad     DF L4/5 D Peron Tib Ant 5 5   PF S1/2 Tibial Grc/Sol 5 5    Sensation:  Light touch Intact throughout   Pin prick    Temperature    Vibration   Proprioception    Coordination/Complex Motor:  - Finger to Nose intact BL - Rapid alternating movement are slowed - Gait: deferred.  Labs/Imaging/Neurodiagnostic studies   CBC:  Recent Labs  Lab June 13, 2024 1001 06/04/24 0814 06/06/24 0231 06/06/24 1533  WBC 6.5   < > 8.9 8.2  NEUTROABS 3.9  --   --  4.8  HGB 16.1   < > 14.0 14.5  HCT 49.4   < > 43.2 44.3  MCV 88.8   < > 88.5 89.1  PLT 175   < > 174 174   < > = values in this interval not displayed.   Basic Metabolic Panel:  Lab  Results  Component Value Date   NA 136 06/06/2024   K 3.7 06/06/2024   CO2 22 06/06/2024   GLUCOSE 94 06/06/2024   BUN 17 06/06/2024   CREATININE 1.46 (H) 06/06/2024   CALCIUM  9.0 06/06/2024   GFRNONAA 51 (L) 06/06/2024   GFRAA 64 07/20/2020   Lipid Panel:  Lab Results  Component Value Date   LDLCALC 48 02/13/2023   HgbA1c:  Lab Results  Component Value Date   HGBA1C 8.4 (H) 06/04/2024   Urine Drug Screen:     Component Value Date/Time   LABOPIA NONE DETECTED 06/06/2024 1430   COCAINSCRNUR NONE DETECTED 06/06/2024 1430   LABBENZ NONE DETECTED 06/06/2024 1430    AMPHETMU NONE DETECTED 06/06/2024 1430   THCU POSITIVE (A) 06/06/2024 1430   LABBARB NONE DETECTED 06/06/2024 1430    Alcohol Level     Component Value Date/Time   ETH <10 11/24/2023 1153   INR  Lab Results  Component Value Date   INR 1.1 11/24/2023   APTT  Lab Results  Component Value Date   APTT 38 (H) 04/29/2021   AED levels: No results found for: PHENYTOIN, ZONISAMIDE, LAMOTRIGINE, LEVETIRACETA  CT Head without contrast(Personally reviewed): CTH was negative for a large hypodensity concerning for a large territory infarct or hyperdensity concerning for an ICH  MR angio head w/o contrast and US  carotid doppler(Personally reviewed): pending  MRI Brain(Personally reviewed): Several punctate acute and subacute infarcts involving both cerebral hemispheres and the cerebellum. Most acute focus in the left centrum semiovale. Acute on chronic synchronous small vessel disease favored, rather than a recent embolic event.  Neurodiagnostics rEEG:  pending  ASSESSMENT   Eleno Weimar is a 68 y.o. male with hx of CAD, Afibb but non compliant with eliquis , daily alcohol use, HTN but noncompliant with meds, DM2, OSA who presented to the ED with shortness of breath, unableto lie flat. He was admitted with Hypertensive emergency, AKI, HFpEF. He became more confused thou intermittently over the last day or 2 and today had an hour of lucidity but intermittent confusion.  AKI is improved, HTN is better controlled with meds. He drinks a beer a night but more during weekend and football season.  MRI brain with embolic appearing infarcts.  I suspect confusion is likely due to delirium on top of chronic poorly controlled comorbidities including poorly controlled DM2, HTN, Afibb non compliant with eliquis  and other medications, poor eating habits including mostly eating fast food and daily EtOH use.  RECOMMENDATIONS  - Frequent Neuro checks per stroke unit protocol -  Recommend Vascular imaging with MRA Angio Head without contrast and US  Carotid doppler - No need for TTE from a neuro standpoint. - Recommend obtaining Lipid panel with LDL - Please start statin if LDL > 70 - Recommend HbA1c to evaluate for diabetes and how well it is controlled. - continue home Eliquis  - SBP goal - aim for gradual normotension - Recommend Telemetry monitoring for arrythmia - Recommend bedside swallow screen prior to PO intake. - Stroke education booklet - Recommend PT/OT/SLP consult - Recommend Urine Tox screen. - rEEG - B12, folate, TSH, RPR, Ammonia. - already on thiamine  replacement, increased to Wernicke's dosing. - delirium precautions.  ______________________________________________________________________  Plan discussed with one daughter at the bedside and another over phone.  Plan discussed with Dr. Mosie with the hospitalist team over secure chat.   Signed, Wylodean Shimmel, MD [facility]

## 2024-06-06 NOTE — Evaluation (Signed)
 Clinical/Bedside Swallow Evaluation Patient Details  Name: [name] [name] MRN: [id number] Date of Birth: May 16, 1956  Today's Date: 06/06/2024 Time: SLP Start Time (ACUTE ONLY): 1359 SLP Stop Time (ACUTE ONLY): 1414 SLP Time Calculation (min) (ACUTE ONLY): 15 min  Past Medical History:  Past Medical History:  Diagnosis Date   Anxiety    CAD (coronary artery disease), native coronary artery    a. Nonobstructive CAD by cath 2013 - diffuse distal and branch vessel CAD, no severe disease in the major coronaries, LV mild global hypokinesis, EF 45%. b. ETT-Sestamibi 5/14: EF 31%, small fixed inferior defect with no ischemia.   Chronic CHF (HCC)    a. Mixed ICM/NICM (?EtOH). EF 35% in 2008. Echo 5/13: EF 60-65%, mod LVH, EF 45% on V gram in 12/2011. EF 12/2012: EF 50-55%, mild LVH, inferobasal HK, mild MR. ETT-Ses 5/14 EF 41%. Cardiac MRI 5/14: EF 44%, mild global HK, subepicardial delayed enhancement in nonspecific RV insertion pattern.   COLONIC POLYPS, HX OF 12/30/2006   Gout    H/O atrial flutter 08/13/2005   a. Ablations in 2007, 2008.   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   PAF (paroxysmal atrial fibrillation) (HCC)    On coumadin    Sleep apnea    suppose to send mask but they never did (05/03/2015)   Type II diabetes mellitus [facility])    Past Surgical History:  Past Surgical History:  Procedure Laterality Date   A FLUTTER ABLATION  2007, 2008   catheter ablation    CARDIAC CATHETERIZATION N/A 05/03/2015   Procedure: Right/Left Heart Cath and Coronary Angiography;  Surgeon: Ezra GORMAN Shuck, MD;  Location: [facility] INVASIVE CV LAB;   Service: Cardiovascular;  Laterality: N/A;   CARDIAC CATHETERIZATION N/A 05/03/2015   Procedure: Coronary Stent Intervention;  Surgeon: Candyce GORMAN Reek, MD;  Location: [facility] INVASIVE CV LAB;  Service: Cardiovascular;  Laterality: N/A;   CARDIAC CATHETERIZATION N/A 11/04/2015   Procedure: Left Heart Cath and Coronary Angiography;  Surgeon: Ezra GORMAN Shuck, MD;  Location: [facility] INVASIVE CV LAB;  Service: Cardiovascular;  Laterality: N/A;   CARDIOVERSION N/A 08/24/2015   Procedure: CARDIOVERSION;  Surgeon: Aleene JINNY Passe, MD;  Location: [facility] ENDOSCOPY;  Service: Cardiovascular;  Laterality: N/A;   CARDIOVERSION N/A 12/14/2022   Procedure: CARDIOVERSION;  Surgeon: Shuck Ezra GORMAN, MD;  Location: [facility] INVASIVE CV LAB;  Service: Cardiovascular;  Laterality: N/A;   CARDIOVERSION N/A 10/02/2023   Procedure: CARDIOVERSION;  Surgeon: Shuck Ezra GORMAN, MD;  Location: [facility] INVASIVE CV LAB;  Service: Cardiovascular;  Laterality: N/A;   CHOLECYSTECTOMY N/A 02/07/2016   Procedure: LAPAROSCOPIC CHOLECYSTECTOMY WITH  INTRAOPERATIVE CHOLANGIOGRAM;  Surgeon: Camellia Blush, MD;  Location: [facility] ORS;  Service: General;  Laterality: N/A;   COLONOSCOPY WITH PROPOFOL  N/A 04/28/2019   Procedure: COLONOSCOPY WITH PROPOFOL ;  Surgeon: Saintclair Jasper, MD;  Location: [facility] ENDOSCOPY;  Service: Gastroenterology;  Laterality: N/A;   CORONARY ANGIOPLASTY  12/05/01   ESOPHAGOGASTRODUODENOSCOPY (EGD) WITH PROPOFOL  N/A 04/27/2019   Procedure: ESOPHAGOGASTRODUODENOSCOPY (EGD) WITH PROPOFOL ;  Surgeon: Saintclair Jasper, MD;  Location: [facility] ENDOSCOPY;  Service: Gastroenterology;  Laterality: N/A;   GIVENS CAPSULE STUDY N/A 06/29/2019  Procedure: GIVENS CAPSULE STUDY;  Surgeon: Dianna Specking, MD;  Location: [facility] ENDOSCOPY;  Service: Endoscopy;  Laterality: N/A;   LEFT HEART CATH AND CORONARY ANGIOGRAPHY N/A 12/28/2016   Procedure: Left Heart Cath and Coronary Angiography;  Surgeon: Rolan Ezra RAMAN, MD;  Location: [facility] INVASIVE CV LAB;  Service: Cardiovascular;   Laterality: N/A;   LEFT HEART CATHETERIZATION WITH CORONARY ANGIOGRAM N/A 12/19/2011   Procedure: LEFT HEART CATHETERIZATION WITH CORONARY ANGIOGRAM;  Surgeon: Ezra RAMAN Rolan, MD;  Location: [facility] CATH LAB;  Service: Cardiovascular;  Laterality: N/A;   OSTEOCHONDROMA EXCISION Left 1972   took bone tumor off my shoulder   POLYPECTOMY  04/28/2019   Procedure: POLYPECTOMY;  Surgeon: Saintclair Jasper, MD;  Location: [facility] ENDOSCOPY;  Service: Gastroenterology;;   PRESSURE SENSOR/CARDIOMEMS N/A 02/01/2020   Procedure: PRESSURE SENSOR/CARDIOMEMS;  Surgeon: Rolan Ezra RAMAN, MD;  Location: [facility] INVASIVE CV LAB;  Service: Cardiovascular;  Laterality: N/A;   RIGHT HEART CATH N/A 02/01/2020   Procedure: RIGHT HEART CATH;  Surgeon: Rolan Ezra RAMAN, MD;  Location: [facility] INVASIVE CV LAB;  Service: Cardiovascular;  Laterality: N/A;   RIGHT/LEFT HEART CATH AND CORONARY ANGIOGRAPHY N/A 07/01/2019   Procedure: RIGHT/LEFT HEART CATH AND CORONARY ANGIOGRAPHY;  Surgeon: Rolan Ezra RAMAN, MD;  Location: [facility] INVASIVE CV LAB;  Service: Cardiovascular;  Laterality: N/A;   TEE WITHOUT CARDIOVERSION N/A 12/14/2022   Procedure: TRANSESOPHAGEAL ECHOCARDIOGRAM;  Surgeon: Rolan Ezra RAMAN, MD;  Location: [facility] INVASIVE CV LAB;  Service: Cardiovascular;  Laterality: N/A;   TRANSESOPHAGEAL ECHOCARDIOGRAM (CATH LAB) N/A 10/02/2023   Procedure: TRANSESOPHAGEAL ECHOCARDIOGRAM;  Surgeon: Rolan Ezra RAMAN, MD;  Location: [facility] INVASIVE CV LAB;  Service: Cardiovascular;  Laterality: N/A;   HPI:  [name] [name] is a 68 y.o. male with medical history significant of hypertension, atrial fibrillation on apixaban , hyperlipidemia, poorly controlled type 2 diabetes, CKD (baseline creatinine appears to be 1.4-1.6), and recent admission in 08/2023 for expressive aphasia 2/2 TIA/HTN emergency p/w hypertensive emergency.Family reports In the past day, his speech became unintelligible.    Assessment / Plan / Recommendation  Clinical Impression   Pt had no signs  of dysphagia, recommend starting a regular/thin liquid diet with general safe swallow precautions (do not feed when lethargic). No ST f/u necessary for swallow needs, but will f/u for speech/cog (see other eval)  Pt seen for skilled ST services for PO readiness. According to MD, pt placed NPO from regular/thin due to family attempting to feed when very lethargic and inappropriate for POs. He was assessed with thin liquids, purees, and regular solids. OME was [facility]- pt edentulous on top, reports that he never wears his dentures even at home. He consumed all consistencies with no overt s/sx of aspiration. Recommend starting a reg/thin diet with general safe swallow precautions, family educated to need attempt to feed if very lethargic. No ST f/u for swallow needs indicated at this time.    SLP Visit Diagnosis: Dysphagia, unspecified (R13.10)    Aspiration Risk  No limitations    Diet Recommendation Regular;Thin liquid    Medication Administration: Whole meds with liquid Supervision: Patient able to self feed Compensations: Minimize environmental distractions;Slow rate;Small sips/bites Postural Changes: Seated upright at 90 degrees;Remain upright for at least 30 minutes after po intake    Other  Recommendations Oral Care Recommendations: Oral care BID     Assistance Recommended at Discharge    Functional Status Assessment Patient has had a recent decline in their functional status and demonstrates the ability to make significant improvements in function  in a reasonable and predictable amount of time.  Frequency and Duration            Prognosis Prognosis for improved oropharyngeal function: Good      Swallow Study   General HPI: [name] [name] is a 68 y.o. male with medical history significant of hypertension, atrial fibrillation on apixaban , hyperlipidemia, poorly controlled type 2 diabetes, CKD (baseline creatinine appears to be 1.4-1.6), and recent admission in 08/2023 for  expressive aphasia 2/2 TIA/HTN emergency p/w hypertensive emergency.Family reports In the past day, his speech became unintelligible. Type of Study: Bedside Swallow Evaluation Previous Swallow Assessment: n/a Diet Prior to this Study: NPO Temperature Spikes Noted: No Respiratory Status: Room air History of Recent Intubation: No Behavior/Cognition: Cooperative;Pleasant mood;Distractible Oral Cavity Assessment: Within Functional Limits Oral Care Completed by SLP: No Oral Cavity - Dentition: Dentures, not available;Other (Comment) (Pt comments he does not wear his dentures) Vision: Functional for self-feeding Self-Feeding Abilities: Able to feed self Patient Positioning: Upright in bed Baseline Vocal Quality: Normal Volitional Cough: Strong Volitional Swallow: Able to elicit    Oral/Motor/Sensory Function Overall Oral Motor/Sensory Function: Within functional limits   Ice Chips     Thin Liquid Thin Liquid: Within functional limits    Nectar Thick     Honey Thick     Puree Puree: Within functional limits   Solid     Solid: Within functional limits     Manuelita Blew M.S. CCC-SLP
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
## 2024-06-06 NOTE — Consult Note (Signed)
 Nephrology Consult   Requesting provider: Dr. Mosie Service requesting consult: TR[facility] Reason for consult: AKI  Assessment/Recommendations:   AKI on CKD3a, nonoliguric -Baseline Cr around 1.4-1.6, now 1.9. AKI likely related to hemodynamic insults in the context of wide swings in BP. He's also been getting ibuprofen for some reason--will d/c. Would allow for some permissive HTN to allow for renal perfusion. If Cr continues to uptrend then will need to hold his ARB and jardiance  -urine pending. Panscan pending: will make sure he doesn't have an obstruction at the very least -given that he is NPO with dry MM, will start very gently isotonic fluids x 1 day. Low threshold to start if volume becomes an issue -Avoid nephrotoxic medications including NSAIDs and iodinated intravenous contrast exposure unless the latter is absolutely indicated.  Preferred narcotic agents for pain control are hydromorphone , fentanyl , and methadone. Morphine  should not be used. Avoid Baclofen and avoid oral sodium phosphate  and magnesium  citrate based laxatives / bowel preps. Continue strict Input and Output monitoring. Will monitor the patient closely with you and intervene or adjust therapy as indicated by changes in clinical status/labs   HTN emergency -has been seen by cardiology, allow for permissive HTN not only for AKI but for new CVA as well -can uptitrate amlodipine  if needed  CVA, new Encephalopathy -neurology to be consulted  HFpEF -euvolemic, monitor daily weights & strict I/O  Hypokalemia -replete PRN  Uncontrolled Diabetes Mellitus Type 2 with Hyperglycemia -mgmt per primary  Alcohol abuse -on CIWA, per primary  Recommendations conveyed to primary service.  Discussed with daughters, wife at bedside.  [name] 06/06/2024 11:48 AM  _____________________________________________________________________________________   History of Present Illness: [name] [name] is a/an 68 y.o. male with a past medical history of CKD, hypertension, DM 2, HFpEF, A-fib, CAD, hyperlipidemia who presents to [facility] initially with shortness of breath.  Had missed several of his medications lately.  Found to have hypertensive emergency, required nitroglycerin  drip which is now off.  He is also on CIWA protocol for history of heavy alcohol abuse.  He had been confused and underwent a brain MRI which showed several punctate acute and subacute infarcts in the cerebral hemispheres and cerebellum. Patient seen and examined bedside. Has been confused but more alert for me. Denies any chest pain, SOB, dysuria, hematuria, abd pain, dizziness. UOP 2.4L   Medications:  Current Facility-Administered Medications  Medication Dose Route Frequency Provider Last Rate Last Admin   acetaminophen  (TYLENOL ) tablet 650 mg  650 mg Oral Q6H PRN Alfornia Madison, MD   650 mg at 06/06/24 0502   albuterol  (PROVENTIL ) (2.5 MG/3ML) 0.083% nebulizer solution 2.5 mg  2.5 mg Inhalation Q6H PRN Georgina Basket, MD       amLODipine  (NORVASC ) tablet 5 mg  5 mg Oral Daily Al-Sultani, Anmar, MD   5 mg at 06/06/24 0954   apixaban  (ELIQUIS ) tablet 5 mg  5 mg Oral BID Georgina Basket, MD   5 mg at 06/06/24 0954   atorvastatin  (LIPITOR ) tablet 80 mg  80 mg Oral Daily Georgina Basket, MD   80 mg at 06/06/24 9045   empagliflozin  (JARDIANCE ) tablet 25 mg  25 mg Oral Daily Johnson, Kathleen R, PA-C   25 mg at 06/06/24 9045   eplerenone  (INSPRA ) tablet 25 mg  25 mg Oral Daily Johnson, Kathleen R, PA-C   25 mg at 06/06/24 9045   folic acid  (FOLVITE ) tablet 1 mg  1 mg Oral Daily Al-Sultani, Anmar, MD   1  mg at 06/06/24 0954   ibuprofen (ADVIL) tablet 600 mg  600 mg Oral Q6H PRN Al-Sultani, Anmar, MD   600 mg at 06/05/24 9167   insulin  aspart (novoLOG ) injection 0-5 Units  0-5 Units Subcutaneous QHS Al-Sultani, Anmar, MD   2 Units at 06/05/24 2117   insulin  aspart (novoLOG ) injection 0-9 Units  0-9 Units Subcutaneous  TID WC Al-Sultani, Anmar, MD   1 Units at 06/05/24 1146   insulin  glargine-yfgn (SEMGLEE ) injection 20 Units  20 Units Subcutaneous QHS Al-Sultani, Anmar, MD       isosorbide  mononitrate (IMDUR ) 24 hr tablet 120 mg  120 mg Oral Daily Johnson, Kathleen R, PA-C   120 mg at 06/06/24 9045   LORazepam  (ATIVAN ) tablet 1-4 mg  1-4 mg Oral Q1H PRN Al-Sultani, Anmar, MD   1 mg at 06/04/24 1233   Or   LORazepam  (ATIVAN ) injection 1-4 mg  1-4 mg Intravenous Q1H PRN Al-Sultani, Anmar, MD       losartan  (COZAAR ) tablet 25 mg  25 mg Oral Daily Johnson, Kathleen R, PA-C   25 mg at 06/06/24 9045   melatonin tablet 5 mg  5 mg Oral QHS PRN Rathore, Vasundhra, MD   5 mg at 06/05/24 2121   metoprolol succinate (TOPROL-XL) 24 hr tablet 25 mg  25 mg Oral Daily Hochrein, James, MD   25 mg at 06/06/24 0956   multivitamin with minerals tablet 1 tablet  1 tablet Oral Daily Al-Sultani, Anmar, MD   1 tablet at 06/06/24 0956   nitroGLYCERIN  50 mg in dextrose  5 % 250 mL (0.2 mg/mL) infusion  0-200 mcg/min Intravenous Continuous Jerrol Agent, MD   Stopped at 06/05/24 1351   ondansetron  (ZOFRAN -ODT) disintegrating tablet 4 mg  4 mg Oral Once PRN Rathore, Vasundhra, MD       potassium chloride  SA (KLOR-CON  M) CR tablet 40 mEq  40 mEq Oral Once Rathore, Vasundhra, MD       thiamine  (VITAMIN B1) tablet 100 mg  100 mg Oral Daily Al-Sultani, Anmar, MD   100 mg at 06/06/24 9045   Or   thiamine  (VITAMIN B1) injection 100 mg  100 mg Intravenous Daily Al-Sultani, Anmar, MD         ALLERGIES Shrimp [shellfish allergy], Ace inhibitors, and Other  MEDICAL HISTORY Past Medical History:  Diagnosis Date   Anxiety    CAD (coronary artery disease), native coronary artery    a. Nonobstructive CAD by cath 2013 - diffuse distal and branch vessel CAD, no severe disease in the major coronaries, LV mild global hypokinesis, EF 45%. b. ETT-Sestamibi 5/14: EF 31%, small fixed inferior defect with no ischemia.   Chronic CHF (HCC)    a. Mixed  ICM/NICM (?EtOH). EF 35% in 2008. Echo 5/13: EF 60-65%, mod LVH, EF 45% on V gram in 12/2011. EF 12/2012: EF 50-55%, mild LVH, inferobasal HK, mild MR. ETT-Ses 5/14 EF 41%. Cardiac MRI 5/14: EF 44%, mild global HK, subepicardial delayed enhancement in nonspecific RV insertion pattern.   COLONIC POLYPS, HX OF 12/30/2006   Gout    H/O atrial flutter 08/13/2005   a. Ablations in 2007, 2008.   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   PAF (paroxysmal atrial fibrillation) (HCC)    On coumadin    Sleep apnea    suppose to send mask but they never did (05/03/2015)   Type II diabetes mellitus (HCC)      SOCIAL HISTORY Social History   Socioeconomic History   Marital status: Widowed    Spouse name: Not on file   Number of children: 2   Years of education: 14   Highest education level: Some college, no degree  Occupational History    Employer: UNEMPLOYED  Tobacco Use   Smoking status: Former    Current packs/day: 0.00    Average packs/day: 4.0 packs/day for 18.0 years (72.0 ttl pk-yrs)    Types: Cigarettes    Start date: 08/13/1965    Quit date: 08/14/1983    Years since quitting: 40.8   Smokeless tobacco: Never  Vaping Use   Vaping status: Never Used  Substance and Sexual Activity   Alcohol use: Yes    Alcohol/week: 16.0 standard drinks of alcohol    Types: 6 Cans of beer, 10 Shots of liquor per week    Comment: 2-3 beers watching sports; occasional glass of liquor; drinks heavily in football season   Drug use: Yes    Types: Marijuana    Comment: twice a week   Sexual activity: Never  Other Topics Concern   Not on file  Social History  Narrative   Not on file   Social Drivers of Health   Financial Resource Strain: Low Risk  (07/23/2022)   Overall Financial Resource Strain (CARDIA)    Difficulty of Paying Living Expenses: Not hard at all  Food Insecurity: No Food Insecurity (06/03/2024)   Hunger Vital Sign    Worried About Running Out of Food in the Last Year: Never true    Ran Out of Food in the Last Year: Never true  Transportation Needs: No Transportation Needs (06/03/2024)   PRAPARE - Administrator, Civil Service (Medical): No    Lack of Transportation (Non-Medical): No  Physical Activity: Inactive (07/23/2022)   Exercise Vital Sign    Days of Exercise per Week: 0 days    Minutes of Exercise per Session: 0 min  Stress: No Stress Concern Present (07/23/2022)   [employer] of Occupational Health - Occupational Stress Questionnaire    Feeling of Stress : Not at all  Social Connections: Socially Isolated (06/03/2024)   Social Connection and Isolation Panel    Frequency of Communication with Friends and Family: More than three times a week    Frequency of Social Gatherings with Friends and Family: Three times a week    Attends Religious Services: Never    Active Member of Clubs or Organizations: No    Attends Banker Meetings: Never    Marital Status: Widowed  Intimate Partner Violence: Not At Risk (06/03/2024)   Humiliation, Afraid, Rape, and Kick questionnaire    Fear of Current or Ex-Partner: No    Emotionally Abused: No    Physically Abused: No    Sexually Abused: No     FAMILY HISTORY Family History  Problem Relation Age of Onset   Diabetes Mother    Hypertension Mother    Heart attack Neg Hx    Stroke Neg Hx      No family history of kidney disease  Review of Systems: 12 systems reviewed Otherwise as per HPI, all other systems reviewed and negative  Physical Exam: Vitals:  06/06/24 0700 06/06/24 0838  BP:  (!) 171/72  Pulse: 62   Resp: 20   Temp: 97.8  F (36.6 C)   SpO2: 98%    No intake/output data recorded.  Intake/Output Summary (Last 24 hours) at 06/06/2024 1148 Last data filed at 06/06/2024 9381 Gross per 24 hour  Intake 240 ml  Output 2400 ml  Net -2160 ml   General: well-appearing, no acute distress, sitting up in bed HEENT: anicteric sclera, oropharynx clear without lesions, dry MM CV: regular rate, normal rhythm, no murmurs, no gallops, no rubs, no peripheral edema Lungs: clear to auscultation bilaterally, normal work of breathing Abd: soft, non-tender, non-distended Skin: no visible lesions or rashes Psych: alert, engaged, appropriate mood and affect Musculoskeletal: no obvious deformities Neuro: normal speech, following commands  Test Results Reviewed Lab Results  Component Value Date   NA 139 06/06/2024   K 3.1 (L) 06/06/2024   CL 101 06/06/2024   CO2 27 06/06/2024   BUN 19 06/06/2024   CREATININE 1.91 (H) 06/06/2024   GFR 49.20 (L) 09/05/2023   CALCIUM  9.0 06/06/2024   ALBUMIN 2.7 (L) 06/06/2024   PHOS 4.1 06/05/2024     I have reviewed all relevant outside healthcare records related to the patient's kidney injury.

## 2024-06-06 NOTE — Progress Notes (Signed)
 Per Dr. Rosslyn request, sent msg to CHF team to push out appt to 6-8 weeks. Cancelled 10/30/ OV. Outlined the rescheduled/pending appt change to AVS.

## 2024-06-06 NOTE — Progress Notes (Signed)
 Progress Note   Patient: [name] [name] FMW:[id number] DOB: May 24, 1956 DOA: 06/03/2024     3 DOS: the patient was seen and examined on 06/06/2024   Brief hospital course:  68 y.o. male with medical history significant of hypertension, atrial fibrillation on apixaban , hyperlipidemia, poorly controlled type 2 diabetes, CKD (baseline creatinine appears to be 1.4-1.6), and recent admission in 08/2023 for expressive aphasia 2/2 TIA/HTN emergency p/w hypertensive emergency.   The patient reported experiencing shortness of breath and inability to sleep the previous night, stating they were up all night fighting for air. The patient mentioned attempting to alleviate the symptoms by sitting outside on the porch multiple times but was unable to obtain relief. The patient decided to seek medical help in the morning and called their daughter around eight o'clock. The patient indicated missing several medications but was unsure of which ones or how many days they had missed.   In the ED, the patient was hypertensive to 170-190s and tachypneic to 27 on RA. Labs notable for  K 3.3, BNP 321, and troponin 81 > 75. EDP started IV lasix  20mg  x1 and nitroglycerin  gtt and requested medicine admission.  Assessment and Plan:  # Hypertensive emergency  - Presented with elevated blood pressures, noted as high as 217/125 in the setting of poor medication compliance. Reports taking his medications 2-3 days a week only.  - Associated symptoms included chest discomfort, shortness of breath and headache - Was started on nitroglycerin  drip in the ED, unsuccessful attempts at weaning down overnight - Home antihypertensives include losartan  25 mg daily, hydralazine  50 mg twice daily, eplerenone  25 mg daily, Imdur  120 mg daily, torsemide  40 mg twice daily - Cardiolgy consulted for assistance with medication management/consolidation to aid with better outpatient adherence - Currently on nitroglycerin  drip, will attempt to  wean off today - Continue amlodipine  5 mg daily, losartan  25 mg daily, Imdur  120 mg daily, eplerenone  25 mg daily, Toprol-XL 25 mg daily - Torsemide  held given AKI  # Right-sided headache - improved - Improving with better BP control - No appreciable neurologic deficits on exam although he reports blurry vision that is not new  - CT head w/o contrast ordered to rule out any intracranial bleeding  # Acute Encephalopathy # Acute on chronic multifocal ischemic strokes due to small vessel disease #? Anomic aphasia - Patient's daughter reported several episodes of confusion and word-searching today, which is unusual for him and consistent with deficits from TIA in 08/2023 - MRI brain showed several punctate acute and subacute infarcts involving both cerebral hemispheres and the cerebellum. Most acute focus in the left centrum semiovale. Acute on chronic synchronous small vessel disease favored, rather than a recent embolic event. No associated hemorrhage or intracranial mass effect. Underlying advanced chronic microvascular ischemic changes. - NPO sips with meds until SLP eval - Neurology consulted, spoke with Dr. Vanessa who will evaluate - Continue Lipitor  80 mg daily and Eliquis  5 mg BID - Given acute nature of encephalopathy, history of UTI and current abdominal pain, will broaden work-up to include UA, blood cultures, CT chest/abd/pelvis, and lactic acid  - Fall precautions - Delirium precautions  # AKI - Cr elevated to 1.91 from 1.14 on admission. However, baseline Cr appears to be around 1.40s most recently - Likely multifactorial secondary to hypertensive emergency on presentation and now Losartan , Torsemide , and Jardiance  use - Ordered urine protein creatinine ratio, UA, urine urea, urine sodium, and urine creatinine - Nephrology consulted, spoke to Dr. Tobie who will  evaluate  # HFpEF (LVEF 60-65%) - Was recently seen in the cardiology clinic. Given his medical noncompliance, he  reports DOE, 3 pillow orthopnea, frequent PND necessitating him to walk out to his front porch for relief - BNP on admission 321 - CXR showed findings consistent with mild vascular congestion and stable borderline cardiomegaly.  - Received IV Lasix  20 mg once in the ED - Cardiology signed off - follow up in 6-8 weeks with Dr. Rolan - Continue home Jardiance  25 mg daily, eplerenone  25 mg daily,  - Held torsemide  40 mg given AKI. Cardiology recommending PRN Torsemide    # Hypokalemia - K 3.1  - Repleted overnight  # Paroxysmal Afib/flutter - Currently rate controled without medications. Per cardiology notes, he is no longer on amiodarone  or sotalol  - Continue Toprol-XL 25 mg daily  # T2DM - Hgb A1c 8.4 (06/04/2024) - Follows with Dr. Dartha with outpatient endocrinology - Outpatient Diabetes medications: Lantus  30 units every day, Humalog  0-10 units TID, Jardiance  25 mg every day, Metformin  1 gram with breakfast, Freestyle Libre 3  - Diabetes educator following  - Reduced Semglee  to 20 units nightly  - Continue SSI ACHS  # Chronic alcohol use - Has prior history of alcohol abuse, more recently drinks 1 beer a day with up to 3-4 beers plus 1-2 drinks of liquor while watching football on weekends - CIWA protocol initiated - Thiamine , folate, and multivitamin supplementation - TOC consulted for alcohol cessation counseling     Subjective: Patient seen at bedside this morning. Patient's three daughters at bedside. Had some nausea overnight for which Zofran  was ordered. Had episodes of nonsustained bradycardia without hypotension, cardiology was paged to bedside.  Reportedly acutely confused late last night and into this morning. More lethargic than previous days, but AAOx3 on my exam (RN noted only alert to self earlier this morning). Patient's daughters report this is inconsistent with his mental baseline even when he does normally get confused in the mornings upon waking up. There are  also concerned about his complains of abdominal pain and note that he has had UTIs in the past. Despite being lethargic, the patient wakes up and reports no focal issues.   Physical Exam: Vitals:   06/05/24 2115 06/05/24 2230 06/06/24 0320 06/06/24 0451  BP: 134/78 127/76  (!) 166/93  Pulse: 73 70 70 63  Resp: 20 20  18   Temp: 98.1 F (36.7 C) 98 F (36.7 C)  97.8 F (36.6 C)  TempSrc: Oral Oral  Oral  SpO2:  94%  95%  Weight:      Height:        Physical Exam Constitutional:      General: He is not in acute distress.    Appearance: He is not ill-appearing.  Cardiovascular:     Rate and Rhythm: Normal rate. Rhythm irregularly irregular.     Heart sounds: Normal heart sounds. No murmur heard. Pulmonary:     Effort: Pulmonary effort is normal. No respiratory distress.     Breath sounds: Normal breath sounds.  Abdominal:     General: Bowel sounds are normal.     Palpations: Abdomen is soft.     Tenderness: There is no abdominal tenderness.  Musculoskeletal:     Right lower leg: No edema.     Left lower leg: No edema.  Skin:    General: Skin is warm and dry.     Capillary Refill: Capillary refill takes less than 2 seconds.  Neurological:  Mental Status: He is easily aroused. He is lethargic, disoriented and confused.     Comments: Lethargic, oriented x 3 to me (noted oriented only x1 earlier this morning)     Family Communication: Discussed plan with patient's three daughters at bedside  Disposition: Status is: Inpatient Remains inpatient appropriate because: acute encephalopathy, AKI   Planned Discharge Destination: Home    Time spent: 65 minutes  Author: Duffy Larch, MD 06/06/2024 6:19 AM  For on call review [email or web address].

## 2024-06-06 NOTE — Progress Notes (Signed)
 No BM in 3 days, paged MD to inquire about possible laxative.

## 2024-06-06 NOTE — Progress Notes (Deleted)
  Progress Note   Date: 06/05/2024  Patient Name: [name] [name]        MRN#: [id number]  Clarification of diagnosis:  Other explanation of clinical findings (please specify)  Chronic HFpEF without acute decompensation. Dyspnea and mild pulmonary congestion were attributed to hypertensive emergency rather than acute heart failure exacerbation.

## 2024-06-06 NOTE — Evaluation (Signed)
 Speech Language Pathology Evaluation Patient Details Name: [name] [name] MRN: [id number] DOB: [birth year] Today's Date: 06/06/2024 Time: 8661-8640 SLP Time Calculation (min) (ACUTE ONLY): 21 min  Problem List:  Patient Active Problem List   Diagnosis Date Noted   Hypertensive emergency without congestive heart failure 06/03/2024   Wrist fracture, left, closed, initial encounter 06/05/2022   Rib pain 06/05/2022   Mobitz (type) I [facility]) atrioventricular block 01/23/2022   Chest pain of uncertain etiology 01/22/2022   Syncope 02/09/2020   Generalized weakness 06/27/2019   Acute on chronic congestive heart failure with left ventricular diastolic dysfunction (HCC) 06/26/2019   Anemia due to chronic kidney disease 06/26/2019   CKD (chronic kidney disease), stage III (HCC) 06/26/2019   Type 2 diabetes mellitus without complication 06/26/2019   Iron deficiency anemia due to chronic blood loss 06/26/2019   History of atrial fibrillation    History of type 2 diabetes mellitus    Anemia    Chest pain 04/24/2019   Overweight (BMI 25.0-29.9) 02/28/2017   Unstable angina (HCC) 08/03/2016   S/P laparoscopic cholecystectomy 02/07/2016   Paroxysmal A-fib (HCC) 01/22/2016   Coronary artery disease involving native coronary artery with unstable angina pectoris (HCC) 12/29/2015   Diabetes (HCC) 11/12/2015   Chronic anticoagulation 08/22/2015   Atrial flutter with controlled response (HCC) 08/22/2015   RUQ pain    Cholelithiases- drain in place 06/04/2015   Essential hypertension    Chronic diastolic CHF (congestive heart failure) (HCC) 04/24/2015   Excessive daytime sleepiness 11/25/2014   Obstructive sleep apnea 11/25/2014   NICM (nonischemic cardiomyopathy) (HCC) 03/11/2014   Anticoagulation goal of INR 2 to 3 10/19/2013   Eczema 10/19/2013   Low libido 10/16/2013   Persistent atrial fibrillation (HCC) 10/06/2013   Rectal bleeding 12/18/2011   HYPERCHOLESTEROLEMIA  07/11/2010   DENTAL CARIES 06/30/2010   GOUT 05/05/2009   HEPATITIS B, CHRONIC 12/30/2006   OSTEOCHONDROMA 12/30/2006   Mitral valve disorder 12/30/2006   LIVER FUNCTION TESTS, ABNORMAL, HX OF 12/30/2006   History of colonic polyps 12/30/2006   COLONOSCOPY, HX OF 05/12/2001   Past Medical History:  Past Medical History:  Diagnosis Date   Anxiety    CAD (coronary artery disease), native coronary artery    a. Nonobstructive CAD by cath 2013 - diffuse distal and branch vessel CAD, no severe disease in the major coronaries, LV mild global hypokinesis, EF 45%. b. ETT-Sestamibi 5/14: EF 31%, small fixed inferior defect with no ischemia.   Chronic CHF (HCC)    a. Mixed ICM/NICM (?EtOH). EF 35% in 2008. Echo 5/13: EF 60-65%, mod LVH, EF 45% on V gram in 12/2011. EF 12/2012: EF 50-55%, mild LVH, inferobasal HK, mild MR. ETT-Ses 5/14 EF 41%. Cardiac MRI 5/14: EF 44%, mild global HK, subepicardial delayed enhancement in nonspecific RV insertion pattern.   COLONIC POLYPS, HX OF 12/30/2006   Gout    H/O atrial flutter 08/13/2005   a. Ablations in 2007, 2008.   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   PAF (paroxysmal atrial fibrillation) (HCC)    On coumadin    Sleep apnea    suppose to send mask but  they never did (05/03/2015)   Type II diabetes mellitus [facility])    Past Surgical History:  Past Surgical History:  Procedure Laterality Date   A FLUTTER ABLATION  2007, 2008   catheter ablation    CARDIAC CATHETERIZATION N/A 05/03/2015   Procedure: Right/Left Heart Cath and Coronary Angiography;  Surgeon: Ezra GORMAN Shuck, MD;  Location: [facility] INVASIVE CV LAB;   Service: Cardiovascular;  Laterality: N/A;   CARDIAC CATHETERIZATION N/A 05/03/2015   Procedure: Coronary Stent Intervention;  Surgeon: Candyce GORMAN Reek, MD;  Location: [facility] INVASIVE CV LAB;  Service: Cardiovascular;  Laterality: N/A;   CARDIAC CATHETERIZATION N/A 11/04/2015   Procedure: Left Heart Cath and Coronary Angiography;  Surgeon: Ezra GORMAN Shuck, MD;  Location: [facility] INVASIVE CV LAB;  Service: Cardiovascular;  Laterality: N/A;   CARDIOVERSION N/A 08/24/2015   Procedure: CARDIOVERSION;  Surgeon: Aleene JINNY Passe, MD;  Location: [facility] ENDOSCOPY;  Service: Cardiovascular;  Laterality: N/A;   CARDIOVERSION N/A 12/14/2022   Procedure: CARDIOVERSION;  Surgeon: Shuck Ezra GORMAN, MD;  Location: [facility] INVASIVE CV LAB;  Service: Cardiovascular;  Laterality: N/A;   CARDIOVERSION N/A 10/02/2023   Procedure: CARDIOVERSION;  Surgeon: Shuck Ezra GORMAN, MD;  Location: [facility] INVASIVE CV LAB;  Service: Cardiovascular;  Laterality: N/A;   CHOLECYSTECTOMY N/A 02/07/2016   Procedure: LAPAROSCOPIC CHOLECYSTECTOMY WITH  INTRAOPERATIVE CHOLANGIOGRAM;  Surgeon: Camellia Blush, MD;  Location: [facility] ORS;  Service: General;  Laterality: N/A;   COLONOSCOPY WITH PROPOFOL  N/A 04/28/2019   Procedure: COLONOSCOPY WITH PROPOFOL ;  Surgeon: Saintclair Jasper, MD;  Location: [facility] ENDOSCOPY;  Service: Gastroenterology;  Laterality: N/A;   CORONARY ANGIOPLASTY  12/05/01   ESOPHAGOGASTRODUODENOSCOPY (EGD) WITH PROPOFOL  N/A 04/27/2019   Procedure: ESOPHAGOGASTRODUODENOSCOPY (EGD) WITH PROPOFOL ;  Surgeon: Saintclair Jasper, MD;  Location: [facility] ENDOSCOPY;  Service: Gastroenterology;  Laterality: N/A;   GIVENS CAPSULE STUDY N/A 06/29/2019   Procedure: GIVENS CAPSULE STUDY;  Surgeon: Dianna Specking, MD;  Location: [facility] ENDOSCOPY;  Service: Endoscopy;  Laterality: N/A;   LEFT HEART CATH AND CORONARY ANGIOGRAPHY N/A 12/28/2016   Procedure: Left Heart Cath and Coronary Angiography;  Surgeon: Shuck Ezra GORMAN, MD;  Location: [facility] INVASIVE CV LAB;  Service: Cardiovascular;   Laterality: N/A;   LEFT HEART CATHETERIZATION WITH CORONARY ANGIOGRAM N/A 12/19/2011   Procedure: LEFT HEART CATHETERIZATION WITH CORONARY ANGIOGRAM;  Surgeon: Ezra GORMAN Shuck, MD;  Location: [facility] CATH LAB;  Service: Cardiovascular;  Laterality: N/A;   OSTEOCHONDROMA EXCISION Left 1972   took bone tumor off my shoulder   POLYPECTOMY  04/28/2019   Procedure: POLYPECTOMY;  Surgeon: Saintclair Jasper, MD;  Location: [facility] ENDOSCOPY;  Service: Gastroenterology;;   PRESSURE SENSOR/CARDIOMEMS N/A 02/01/2020   Procedure: PRESSURE SENSOR/CARDIOMEMS;  Surgeon: Shuck Ezra GORMAN, MD;  Location: [facility] INVASIVE CV LAB;  Service: Cardiovascular;  Laterality: N/A;   RIGHT HEART CATH N/A 02/01/2020   Procedure: RIGHT HEART CATH;  Surgeon: Shuck Ezra GORMAN, MD;  Location: [facility] INVASIVE CV LAB;  Service: Cardiovascular;  Laterality: N/A;   RIGHT/LEFT HEART CATH AND CORONARY ANGIOGRAPHY N/A 07/01/2019   Procedure: RIGHT/LEFT HEART CATH AND CORONARY ANGIOGRAPHY;  Surgeon: Shuck Ezra GORMAN, MD;  Location: [facility] INVASIVE CV LAB;  Service: Cardiovascular;  Laterality: N/A;   TEE WITHOUT CARDIOVERSION N/A 12/14/2022   Procedure: TRANSESOPHAGEAL ECHOCARDIOGRAM;  Surgeon: Shuck Ezra GORMAN, MD;  Location: [facility] INVASIVE CV LAB;  Service: Cardiovascular;  Laterality: N/A;   TRANSESOPHAGEAL ECHOCARDIOGRAM (CATH LAB) N/A 10/02/2023   Procedure: TRANSESOPHAGEAL ECHOCARDIOGRAM;  Surgeon: Shuck Ezra GORMAN, MD;  Location: [facility] INVASIVE CV LAB;  Service: Cardiovascular;  Laterality: N/A;   HPI:  Anikin Prosser is a 68 y.o. male with medical history significant of hypertension, atrial fibrillation on apixaban , hyperlipidemia, poorly controlled type 2 diabetes, CKD (baseline creatinine appears to be 1.4-1.6), and recent admission in 08/2023 for expressive aphasia 2/2 TIA/HTN emergency p/w hypertensive emergency.Family reports In the past day, his speech became unintelligible.   Assessment / Plan / Recommendation Clinical Impression   Pt presents with a  mild cognitive impairment and mild expressive aphasia. He would benefit from continued ST while admitted and HH ST upon d/c for cognitive and language skills  Pt seen for assessment of cognitive skills and language skills. The pt was initially not intended to be seen for SLE, therefore evaluation limited by lack of screening tools. The pt's family reports that he did not have any cognitive or language concerns when admitted on 10/20, but noticed that he had confusing speech this morning. His daughter reports that he is very intelligent and independent, but has exhibited some signs of sundowning at baseline at home. He had some difficulty following multi step complex directions (50% success), but was able to do simple two-step directions with 100% success. He had some difficulty with naming (ie say straw for pen but then self correcting), overall 90% success. He was able to make repetitions at the word level ok, but increased difficulty at the phrase level (67% success). He answered ye/no questions with 90% accuracy.  His reading was [facility] and no signs of apraxic speech with DDK, but generally slowed speech. The pt's family reports that his speech, which SLP observed as slightly dysarthric, was at baseline and had no concerns for articulation at this time. Pt presenting with a mild cognitive impairment and a mild expressive aphasia, family educated on this and the POC. ST to follow up with services to address language and cognitive concerns.  SLP Assessment       Assistance Recommended at Discharge     Functional Status Assessment Patient has had a recent decline in their functional status and demonstrates the ability to make significant improvements in function in a reasonable and predictable amount of time.  Frequency and Duration min 2x/week  1 week      SLP Evaluation Cognition  Orientation Level: Oriented X4       Comprehension  Auditory Comprehension Overall Auditory Comprehension:  Impaired Yes/No Questions: Impaired Other Yes/No Questions Comments`: Minimal (9/10) Commands: Impaired One Step Basic Commands: 75-100% accurate Two Step Basic Commands: 50-74% accurate Complex Commands: 25-49% accurate Conversation: Simple Interfering Components: Attention;Processing speed EffectiveTechniques: Extra processing time;Repetition Reading Comprehension Reading Status: Within funtional limits    Expression Expression Primary Mode of Expression: Verbal Verbal Expression Overall Verbal Expression: Impaired Automatic Speech: Name Level of Generative/Spontaneous Verbalization: Sentence Repetition: Impaired Level of Impairment: Phrase level;Sentence level Naming: Impairment Responsive: 51-75% accurate Verbal Errors: Phonemic paraphasias;Not aware of errors;Semantic paraphasias Pragmatics: No impairment Interfering Components: Attention Effective Techniques: Open ended questions;Semantic cues   Oral / Motor  Oral Motor/Sensory Function Overall Oral Motor/Sensory Function: Within functional limits Motor Speech Overall Motor Speech: Appears within functional limits for tasks assessed            Manuelita Blew M.S. CCC-SLP

## 2024-06-06 NOTE — Telephone Encounter (Signed)
 Hi CHF team, Dr. Barbaraann recommended we cancel this patient's 06/11/24 OV (given this hospitalization) and push out to 6-8 weeks. I cancelled the 10/30 appt. Can you schedule the 6-8 week follow-up and contact them with appointment? He has had some confusion in the hospital so likely need to relay to family. Not sure when he is being discharged, but if still inpatient when you see this on Monday, can also relay to hospital team. Thank you! Mir Fullilove

## 2024-06-06 NOTE — Progress Notes (Signed)
 Patient reports not feeling well, headache he rates 5/10 and also complaining of nausea and vomiting. Of note blood pressure is high at 171/98. No anti-emetic on MAR. Will page triad on call to see if I can get anti-emetic.

## 2024-06-07 ENCOUNTER — Inpatient Hospital Stay (HOSPITAL_COMMUNITY)

## 2024-06-07 DIAGNOSIS — F1210 Cannabis abuse, uncomplicated: Secondary | ICD-10-CM

## 2024-06-07 DIAGNOSIS — Z91148 Patient's other noncompliance with medication regimen for other reason: Secondary | ICD-10-CM

## 2024-06-07 DIAGNOSIS — T45516A Underdosing of anticoagulants, initial encounter: Secondary | ICD-10-CM

## 2024-06-07 DIAGNOSIS — I6502 Occlusion and stenosis of left vertebral artery: Secondary | ICD-10-CM | POA: Diagnosis not present

## 2024-06-07 DIAGNOSIS — I6340 Cerebral infarction due to embolism of unspecified cerebral artery: Secondary | ICD-10-CM | POA: Diagnosis not present

## 2024-06-07 DIAGNOSIS — I480 Paroxysmal atrial fibrillation: Secondary | ICD-10-CM

## 2024-06-07 DIAGNOSIS — E785 Hyperlipidemia, unspecified: Secondary | ICD-10-CM

## 2024-06-07 DIAGNOSIS — R569 Unspecified convulsions: Secondary | ICD-10-CM | POA: Diagnosis not present

## 2024-06-07 DIAGNOSIS — R4182 Altered mental status, unspecified: Secondary | ICD-10-CM

## 2024-06-07 DIAGNOSIS — I161 Hypertensive emergency: Secondary | ICD-10-CM | POA: Diagnosis not present

## 2024-06-07 DIAGNOSIS — I639 Cerebral infarction, unspecified: Secondary | ICD-10-CM | POA: Diagnosis not present

## 2024-06-07 LAB — COMPREHENSIVE METABOLIC PANEL WITH GFR
ALT: 17 U/L (ref 0–44)
AST: 19 U/L (ref 15–41)
Albumin: 2.5 g/dL — ABNORMAL LOW (ref 3.5–5.0)
Alkaline Phosphatase: 61 U/L (ref 38–126)
Anion gap: 11 (ref 5–15)
BUN: 16 mg/dL (ref 8–23)
CO2: 21 mmol/L — ABNORMAL LOW (ref 22–32)
Calcium: 8.9 mg/dL (ref 8.9–10.3)
Chloride: 105 mmol/L (ref 98–111)
Creatinine, Ser: 1.45 mg/dL — ABNORMAL HIGH (ref 0.61–1.24)
GFR, Estimated: 52 mL/min — ABNORMAL LOW (ref >60)
Glucose, Bld: 110 mg/dL — ABNORMAL HIGH (ref 70–99)
Potassium: 3.7 mmol/L (ref 3.5–5.1)
Sodium: 137 mmol/L (ref 135–145)
Total Bilirubin: 1 mg/dL (ref 0.0–1.2)
Total Protein: 5.8 g/dL — ABNORMAL LOW (ref 6.5–8.1)

## 2024-06-07 LAB — CBC
HCT: 42.7 % (ref 39.0–52.0)
Hemoglobin: 14 g/dL (ref 13.0–17.0)
MCH: 29.2 pg (ref 26.0–34.0)
MCHC: 32.8 g/dL (ref 30.0–36.0)
MCV: 89 fL (ref 80.0–100.0)
Platelets: 180 K/uL (ref 150–400)
RBC: 4.8 MIL/uL (ref 4.22–5.81)
RDW: 15.7 % — ABNORMAL HIGH (ref 11.5–15.5)
WBC: 6.4 K/uL (ref 4.0–10.5)
nRBC: 0 % (ref 0.0–0.2)

## 2024-06-07 LAB — LIPID PANEL
Cholesterol: 123 mg/dL (ref 0–200)
HDL: 40 mg/dL — ABNORMAL LOW (ref >40)
LDL Cholesterol: 65 mg/dL (ref 0–99)
Total CHOL/HDL Ratio: 3.1 ratio
Triglycerides: 88 mg/dL (ref <150)
VLDL: 18 mg/dL (ref 0–40)

## 2024-06-07 LAB — GLUCOSE, CAPILLARY
Glucose-Capillary: 96 mg/dL (ref 70–99)
Glucose-Capillary: 182 mg/dL — ABNORMAL HIGH (ref 70–99)
Glucose-Capillary: 157 mg/dL — ABNORMAL HIGH (ref 70–99)
Glucose-Capillary: 164 mg/dL — ABNORMAL HIGH (ref 70–99)

## 2024-06-07 NOTE — Progress Notes (Signed)
 Progress Note   Patient: [name] [name] FMW:[id number] DOB: [birth year] DOA: 06/03/2024     4 DOS: the patient was seen and examined on 06/07/2024   Brief hospital course:  68 y.o. male with medical history significant of hypertension, atrial fibrillation on apixaban , hyperlipidemia, poorly controlled type 2 diabetes, CKD (baseline creatinine appears to be 1.4-1.6), and recent admission in 08/2023 for expressive aphasia 2/2 TIA/HTN emergency p/w hypertensive emergency.   The patient reported experiencing shortness of breath and inability to sleep the previous night, stating they were up all night fighting for air. The patient mentioned attempting to alleviate the symptoms by sitting outside on the porch multiple times but was unable to obtain relief. The patient decided to seek medical help in the morning and called their daughter around eight o'clock. The patient indicated missing several medications but was unsure of which ones or how many days they had missed.   In the ED, the patient was hypertensive to 170-190s and tachypneic to 27 on RA. Labs notable for  K 3.3, BNP 321, and troponin 81 > 75. EDP started IV lasix  20mg  x1 and nitroglycerin  gtt and requested medicine admission.  He was ultimately titrated off nitroglycerin  drip and onto PO antihypertensives. He was noted to develop acute encephalopathy on the evening of 10/24. MRI brain showed acute on chronic multifocal ischemic strokes possibly small vessel disease. Neurology was consulted. Workup was broadened to include CT C/A/P, blood cultures, and UA. AKI worsening possibly in the setting of hypertensive emergency and resumption of antihypertensives/diuretics.   Assessment and Plan:  # Hypertensive emergency - resolved - Presented with elevated blood pressures, noted as high as 217/125 in the setting of poor medication compliance. Reports taking his medications 2-3 days a week only. Associated symptoms included chest discomfort,  shortness of breath and headache  Home antihypertensives include losartan  25 mg daily, hydralazine  50 mg twice daily, eplerenone  25 mg daily, Imdur  120 mg daily, torsemide  40 mg twice daily - Was started on nitroglycerin  drip in the ED, ultimately weaned off with PO antihypertensives started - Cardiology consulted and assisted with medication management/consolidation to aid with better outpatient adherence - Continue amlodipine  5 mg daily, losartan  25 mg daily, Imdur  120 mg daily, eplerenone  25 mg daily, Toprol-XL 25 mg daily - Torsemide  held given AKI  # Acute Encephalopathy - resolved # Acute multifocal ischemic strokes likely embolic secondary to PAF not compliant with Eliquis  - Patient's daughter reported several episodes of confusion and word-searching on 10/25 , which is unusual for him and consistent with deficits from TIA in 08/2023 - MRI brain showed several punctate acute and subacute infarcts involving both cerebral hemispheres and the cerebellum. Most acute focus in the left centrum semiovale. Acute on chronic synchronous small vessel disease favored, rather than a recent embolic event. No associated hemorrhage or intracranial mass effect. Underlying advanced chronic microvascular ischemic changes. - Given acute nature of encephalopathy, history of UTI and current abdominal pain, will broaden work-up to include UA, blood cultures, CT chest/abd/pelvis, and lactic acid, which was unrevealing. - Neurology consulted - suspected confusion likely multifactorial including delirium and uncontrolled cormorbidities  - MRA showed occlusion of nondominant left vertebral artery at the dural margin. - US  carotid unremarkable - EEG normal - TTE pending - Continue Lipitor  80 mg daily and Eliquis  5 mg BID - Fall precautions - Delirium precautions  # AKI - Cr elevated to 1.91 from 1.14 on admission. However, baseline Cr appears to be around 1.40s most recently - Likely  multifactorial secondary to  hypertensive emergency on presentation and now Losartan , Torsemide , and Jardiance  use - Nephrology consulted, Dr. Tobie indicated likely secondary to hemodynamic insults in the setting of wide swings in BP, and to allow for some permissive HTN - Cr improved to 1.45 today, consistent with baseline - Fluids discontinued as patient is eating  # Right-sided headache - improved - Improving with better BP control - No appreciable neurologic deficits on exam although he reports blurry vision that is not new  - CT head w/o contrast ordered to rule out any intracranial bleeding  # HFpEF (LVEF 60-65%) - Was recently seen in the cardiology clinic. Given his medical noncompliance, he reports DOE, 3 pillow orthopnea, frequent PND necessitating him to walk out to his front porch for relief - BNP on admission 321 - CXR showed findings consistent with mild vascular congestion and stable borderline cardiomegaly.  - Received IV Lasix  20 mg once in the ED - Cardiology signed off - follow up in 6-8 weeks with Dr. Rolan - Continue home Jardiance  25 mg daily, eplerenone  25 mg daily,  - Held torsemide  40 mg given AKI. Cardiology recommending PRN Torsemide    # Hypokalemia - resolved  # Paroxysmal Afib/flutter - Currently rate controled without medications. Per cardiology notes, he is no longer on amiodarone  or sotalol  - Continue Toprol-XL 25 mg daily  # T2DM - Hgb A1c 8.4 (06/04/2024) - Follows with Dr. Dartha with outpatient endocrinology - Outpatient Diabetes medications: Lantus  30 units every day, Humalog  0-10 units TID, Jardiance  25 mg every day, Metformin  1 gram with breakfast, Freestyle Libre 3  - Diabetes educator following  - Reduced Semglee  to 20 units nightly  - Continue SSI ACHS  # Chronic alcohol use - Has prior history of alcohol abuse, more recently drinks 1 beer a day with up to 3-4 beers plus 1-2 drinks of liquor while watching football on weekends - CIWA protocol initiated - Thiamine ,  folate, and multivitamin supplementation - TOC consulted for alcohol cessation counseling     Subjective: Patient seen at bedside this morning. Daughter at bedside. He is much improved with hardly any confusion. He is oriented to person, place, time, and situation. He denies any chest pain, SOB, confusion, headache, dizziness. He is pending further imaging for stroke workup.  Physical Exam: Vitals:   06/06/24 1951 06/06/24 2301 06/07/24 0256 06/07/24 0709  BP: 137/88  (!) 139/96 (!) 146/87  Pulse: 69   72  Resp: 19 19 17 16   Temp: 98.4 F (36.9 C) 98.5 F (36.9 C) 98.4 F (36.9 C) 98.8 F (37.1 C)  TempSrc: Oral Oral Oral Oral  SpO2: 92% 96%  93%  Weight:      Height:        Physical Exam Constitutional:      General: He is not in acute distress.    Appearance: He is not ill-appearing.  Cardiovascular:     Rate and Rhythm: Normal rate. Rhythm irregularly irregular.     Heart sounds: Normal heart sounds. No murmur heard. Pulmonary:     Effort: Pulmonary effort is normal. No respiratory distress.     Breath sounds: Normal breath sounds.  Abdominal:     General: Bowel sounds are normal.     Palpations: Abdomen is soft.     Tenderness: There is no abdominal tenderness.  Musculoskeletal:     Right lower leg: No edema.     Left lower leg: No edema.  Skin:    General: Skin is warm  and dry.     Capillary Refill: Capillary refill takes less than 2 seconds.  Neurological:     Mental Status: He is alert, oriented to person, place, and time and easily aroused. Mental status is at baseline.     Cranial Nerves: No cranial nerve deficit.     Sensory: Sensation is intact.     Motor: Motor function is intact.     Coordination: Coordination is intact.     Family Communication: Patient's daughter, [name], at bedside   Disposition: Status is: Inpatient Remains inpatient appropriate because: pending completion of neurologic workup  Planned Discharge Destination: Home  DVT  prophylaxis: SCDs Start: 06/03/24 1548 apixaban  (ELIQUIS ) tablet 5 mg       Time spent: 40 minutes  Author: Annalaura Sauseda Al-Sultani, MD 06/07/2024 8:02 AM  For on call review [email or web address].

## 2024-06-07 NOTE — Progress Notes (Signed)
 Carotid artery duplex has been completed. Preliminary results can be found in CV Proc through chart review.   06/07/24 2:35 PM Cathlyn Collet RVT

## 2024-06-07 NOTE — Progress Notes (Signed)
 EEG complete - results pending

## 2024-06-07 NOTE — Progress Notes (Signed)
 STROKE TEAM PROGRESS NOTE   SUBJECTIVE (INTERVAL HISTORY) His wife and two daughters are at the bedside.  Overall his condition is rapidly improving. Pt sitting at the edge of bed, orientated and awake alert, neuro intact.  Educated on stroke risk factor modification.   OBJECTIVE Temp:  [98 F (36.7 C)-98.8 F (37.1 C)] 98 F (36.7 C) (10/26 1100) Pulse Rate:  [62-72] 62 (10/26 1100) Cardiac Rhythm: Atrial fibrillation (10/26 0700) Resp:  [16-19] 18 (10/26 1100) BP: (137-153)/(73-96) 153/89 (10/26 1100) SpO2:  [92 %-96 %] 96 % (10/26 1100)  Recent Labs  Lab 06/06/24 1205 06/06/24 1611 06/06/24 2119 06/07/24 0619 06/07/24 1208  GLUCAP 150* 85 147* 96 182*   Recent Labs  Lab 06/04/24 0814 06/05/24 1014 06/06/24 0231 06/06/24 1533 06/07/24 0418  NA 137 138 139 136 137  K 3.1* 3.3* 3.1* 3.7 3.7  CL 98 102 101 103 105  CO2 28 26 27 22  21*  GLUCOSE 234* 159* 70 94 110*  BUN 9 18 19 17 16   CREATININE 1.31* 1.74* 1.91* 1.46* 1.45*  CALCIUM  8.9 8.9 9.0 9.0 8.9  MG  --  1.7  --  2.2  --   PHOS  --  4.1  --  3.6  --    Recent Labs  Lab 06/04/24 0814 06/05/24 1014 06/06/24 0231 06/06/24 1533 06/07/24 0418  AST 15 14* 12* 30 19  ALT 14 12 10 22 17   ALKPHOS 60 48 52 71 61  BILITOT 1.9* 1.2 0.8 1.1 1.0  PROT 7.1 6.0* 6.1* 6.4* 5.8*  ALBUMIN 3.0* 2.6* 2.7* 2.8* 2.5*   Recent Labs  Lab 06/03/24 1001 06/04/24 0814 06/05/24 1014 06/06/24 0231 06/06/24 1533 06/07/24 0418  WBC 6.5 6.1 7.1 8.9 8.2 6.4  NEUTROABS 3.9  --   --   --  4.8  --   HGB 16.1 16.3 14.1 14.0 14.5 14.0  HCT 49.4 48.8 42.2 43.2 44.3 42.7  MCV 88.8 87.8 87.7 88.5 89.1 89.0  PLT 175 186 163 174 174 180   No results for input(s): CKTOTAL, CKMB, CKMBINDEX, TROPONINI in the last 168 hours. No results for input(s): LABPROT, INR in the last 72 hours. Recent Labs    06/06/24 1237  COLORURINE YELLOW  LABSPEC 1.022  PHURINE 7.0  GLUCOSEU >=500*  HGBUR NEGATIVE  BILIRUBINUR NEGATIVE   KETONESUR 5*  PROTEINUR 100*  NITRITE NEGATIVE  LEUKOCYTESUR NEGATIVE       Component Value Date/Time   CHOL 123 06/07/2024 0418   CHOL 139 04/08/2018 1102   TRIG 88 06/07/2024 0418   HDL 40 (L) 06/07/2024 0418   HDL 49 04/08/2018 1102   CHOLHDL 3.1 06/07/2024 0418   VLDL 18 06/07/2024 0418   LDLCALC 65 06/07/2024 0418   LDLCALC 68 04/08/2018 1102   Lab Results  Component Value Date   HGBA1C 8.4 (H) 06/04/2024      Component Value Date/Time   LABOPIA NONE DETECTED 06/06/2024 1430   COCAINSCRNUR NONE DETECTED 06/06/2024 1430   LABBENZ NONE DETECTED 06/06/2024 1430   AMPHETMU NONE DETECTED 06/06/2024 1430   THCU POSITIVE (A) 06/06/2024 1430   LABBARB NONE DETECTED 06/06/2024 1430    No results for input(s): ETH in the last 168 hours.  I have personally reviewed the radiological images below and agree with the radiology interpretations.  VAS US  CAROTID Result Date: 06/07/2024 Carotid Arterial Duplex Study Patient Name:  WALLY SHEVCHENKO [facility]  Date of Exam:   06/07/2024 Medical Rec #: [id number]  Accession #:    [id number] Date of Birth: 31-Jan-1956               Patient Gender: M Patient Age:   28 years Exam Location:  [facility] Procedure:      VAS US  CAROTID Referring Phys: [facility]LL[facility] --------------------------------------------------------------------------------  Indications:       CVA. Risk Factors:      Hypertension, Diabetes. Comparison Study:  No prior studies. Performing Technologist: Cordella Collet RVT  Examination Guidelines: A complete evaluation includes B-mode imaging, spectral Doppler, color Doppler, and power Doppler as needed of all accessible portions of each vessel. Bilateral testing is considered an integral part of a complete examination. Limited examinations for reoccurring indications may be performed as noted.  Right Carotid Findings: +----------+--------+--------+--------+--------------------------+--------+           PSV  cm/sEDV cm/sStenosisPlaque Description        Comments +----------+--------+--------+--------+--------------------------+--------+ CCA Prox  63      7               smooth and heterogenous            +----------+--------+--------+--------+--------------------------+--------+ CCA Distal71      7               irregular and heterogenous         +----------+--------+--------+--------+--------------------------+--------+ ICA Prox  53      6               smooth and heterogenous            +----------+--------+--------+--------+--------------------------+--------+ ICA Mid   40      9                                         tortuous +----------+--------+--------+--------+--------------------------+--------+ ICA Distal38      10                                        tortuous +----------+--------+--------+--------+--------------------------+--------+ ECA       167     1                                                  +----------+--------+--------+--------+--------------------------+--------+ +----------+--------+-------+--------+-------------------+           PSV cm/sEDV cmsDescribeArm Pressure (mmHG) +----------+--------+-------+--------+-------------------+ [id number]                                        +----------+--------+-------+--------+-------------------+ +---------+--------+--+--------+-+---------+ VertebralPSV cm/s44EDV cm/s6Antegrade +---------+--------+--+--------+-+---------+  Left Carotid Findings: +----------+--------+-------+--------+--------------------------------+--------+           PSV cm/sEDV    StenosisPlaque Description              Comments                   cm/s                                                    +----------+--------+-------+--------+--------------------------------+--------+  CCA Prox  80      13             smooth and heterogenous                   +----------+--------+-------+--------+--------------------------------+--------+ CCA Distal122     13             smooth and heterogenous                  +----------+--------+-------+--------+--------------------------------+--------+ ICA Prox  91      12             smooth, heterogenous and                                                  calcific                                 +----------+--------+-------+--------+--------------------------------+--------+ ICA Mid   56      14                                             tortuous +----------+--------+-------+--------+--------------------------------+--------+ ICA Distal44      10                                             tortuous +----------+--------+-------+--------+--------------------------------+--------+ ECA       139                                                             +----------+--------+-------+--------+--------------------------------+--------+ +----------+--------+--------+--------+-------------------+           PSV cm/sEDV cm/sDescribeArm Pressure (mmHG) +----------+--------+--------+--------+-------------------+ Subclavian101                                         +----------+--------+--------+--------+-------------------+ +---------+--------+--+--------+-+---------+ VertebralPSV cm/s64EDV cm/s6Antegrade +---------+--------+--+--------+-+---------+   Summary: Right Carotid: Velocities in the right ICA are consistent with a 1-39% stenosis. Left Carotid: Velocities in the left ICA are consistent with a 1-39% stenosis. Vertebrals: Bilateral vertebral arteries demonstrate antegrade flow. *See table(s) above for measurements and observations.     Preliminary    MR ANGIO HEAD WO CONTRAST Result Date: 06/07/2024 EXAM: MR Angiography Head without intravenous Contrast. 06/07/2024 11:36:00 AM TECHNIQUE: Magnetic resonance angiography images of the head without intravenous contrast.  Multiplanar 2D and 3D reformatted images are provided for review. COMPARISON: None provided. CLINICAL HISTORY: Neuro deficit, acute, stroke suspected. FINDINGS: ANTERIOR CIRCULATION: No significant stenosis of the internal carotid arteries. No significant stenosis of the anterior cerebral arteries. No significant stenosis of the middle cerebral arteries. No aneurysm. POSTERIOR CIRCULATION: The nondominant Left Vertebral Artery is occluded at the dural margin. The Right Vertebral Artery and Basilar Artery are normal. Posterior Cerebral Arteries are unremarkable. No aneurysm. IMPRESSION: 1. Occlusion of the  nondominant left vertebral artery at the dural margin. The basilar artery and posterior cerebral arteries appear normal. Electronically signed by: Lonni Necessary MD 06/07/2024 12:18 PM EDT RP Workstation: [id number]   CT CHEST ABDOMEN PELVIS WO CONTRAST Result Date: 06/06/2024 EXAM: CT CHEST, ABDOMEN AND PELVIS WITHOUT CONTRAST 06/06/2024 03:50:52 PM TECHNIQUE: CT of the chest, abdomen and pelvis was performed without the administration of intravenous contrast. Multiplanar reformatted images are provided for review. Automated exposure control, iterative reconstruction, and/or weight based adjustment of the mA/kV was utilized to reduce the radiation dose to as low as reasonably achievable. COMPARISON: CTA chest dated 01/22/2022 and CT abdomen/pelvis dated 01/13/2016. CLINICAL HISTORY: acute encephalopathy, abdominal pain. Chief complaints; Chest Pain; CT CHEST ABDOMEN PELVIS WO CONTRAST; acute encephalopathy, abdominal pain FINDINGS: CHEST: MEDIASTINUM AND LYMPH NODES: Cardiomegaly. Severe 3 vessel coronary atherosclerosis. The central airways are clear. Mild mediastinal lymphadenopathy, including a dominant 14 mm right lower paratracheal node, progressive but possibly reactive. No hilar or axillary lymphadenopathy. LUNGS AND PLEURA: Interlobular septal thickening lower lobe/infrahilar predominant, favoring  mild interstitial edema. Small right and trace left pleural effusions. Left lower lobe calcification/granuloma (image 45), new from prior, benign. No pneumothorax. ABDOMEN AND PELVIS: LIVER: The liver is unremarkable. GALLBLADDER AND BILE DUCTS: Status post cholecystectomy. No biliary ductal dilatation. SPLEEN: No acute abnormality. PANCREAS: No acute abnormality. ADRENAL GLANDS: No acute abnormality. KIDNEYS, URETERS AND BLADDER: 3.5 cm left upper pole renal cyst, benign. No follow-up is recommended. Per consensus, no follow-up is needed for simple Bosniak type 1 and 2 renal cysts, unless the patient has a malignancy history or risk factors. No stones in the kidneys or ureters. No hydronephrosis. No perinephric or periureteral stranding. Urinary bladder is unremarkable. GI AND BOWEL: Stomach demonstrates no acute abnormality. Normal appendix (image 85). There is no bowel obstruction. REPRODUCTIVE ORGANS: Mild prostatomegaly. PERITONEUM AND RETROPERITONEUM: No ascites. No free air. VASCULATURE: Aorta is normal in caliber. Thoracic aortic atherosclerosis. Abdominal aortic atherosclerosis. ABDOMINAL AND PELVIS LYMPH NODES: No lymphadenopathy. BONES AND SOFT TISSUES: Grade 1 spondylolisthesis at L5-S1. Degenerative changes of the visualized thoracolumbar spine. Small fat-containing right inguinal hernia. No focal soft tissue abnormality. IMPRESSION: 1. Cardiomegaly with mild interstitial edema and small bilateral pleural effusions. 2. Mild mediastinal lymphadenopathy, possibly reactive. Consider follow-up CT chest in 3 months. 3. No acute findings in the abdomen/pelvis. Electronically signed by: Pinkie Pebbles MD 06/06/2024 09:30 PM EDT RP Workstation: [id number]   US  RENAL Result Date: 06/06/2024 EXAM: US  Retroperitoneum Complete, Renal. CLINICAL HISTORY: 409830 AKI (acute kidney injury) 409830. AKI (acute kidney injury) TECHNIQUE: Real-time ultrasound of the retroperitoneum (complete) with image documentation.  COMPARISON: None provided. FINDINGS: RIGHT KIDNEY: Right kidney measures 10.4 x 4.4 x 5.5 cm. Total volume equals 131.5 ml. No hydronephrosis, renal stone, or mass visualized. LEFT KIDNEY: Left kidney measures 11.0 x 6.2 x 5.8 cm. Total volume equals 204.4 ml. An anechoic cyst in the upper pole measures 3.5 cm. No hydronephrosis or renal stone. BLADDER: Unremarkable as visualized. IMPRESSION: 1. No hydronephrosis or mass in the right kidney. 2. No hydronephrosis in the left kidney. Electronically signed by: Norleen Boxer MD 06/06/2024 05:44 PM EDT RP Workstation: [id number]   MR BRAIN WO CONTRAST Result Date: 06/06/2024 EXAM: MRI BRAIN WITHOUT CONTRAST 06/05/2024 08:19:32 PM TECHNIQUE: Multiplanar multisequence MRI of the head/brain was performed without the administration of intravenous contrast. COMPARISON: Brain MRI 08/22/2023. Head CT 06/04/2024. CLINICAL HISTORY: 68 year old male with hypertensive emergency and acute neuro deficit, stroke suspected. FINDINGS: BRAIN AND VENTRICLES: Punctate restricted diffusion is present in  the posterior left frontal lobe white matter (central semiovale, series 2 image 39), right hemisphere periventricular white matter (series 2 image 32), superior right perirolandic cortex (image 42), and left superior cerebellum (series 2 image 16). There is background widespread patchy and confluent cerebral white matter chronic T2 and FLAIR hyperintensity. T2 shine through is noted elsewhere, including in the pons. Moderate chronic T2 and FLAIR heterogeneity is also present throughout the bilateral deep gray nuclei. Occasional punctate chronic infarcts are seen in the cerebellum. No cortical encephalomalacia. No definite chronic cerebral blood products on SWI. No intracranial hemorrhage. No mass. No midline shift. No hydrocephalus. The sella is unremarkable. Chronically abnormal distal left vertebral artery flow void is stable (series 5 image 5). Other major vascular flow voids are  preserved. Stable cerebral volume. Acute on chronic synchronous small vessel disease is favored rather than a recent embolic event. ORBITS: No acute abnormality. SINUSES AND MASTOIDS: No acute abnormality. BONES AND SOFT TISSUES: Normal marrow signal. No acute soft tissue abnormality. Negative for age visible cervical spine. IMPRESSION: 1. Several punctate acute and subacute infarcts involving both cerebral hemispheres and the cerebellum. Most acute focus in the left centrum semiovale. Acute on chronic synchronous small vessel disease favored, rather than a recent embolic event. 2. No associated hemorrhage or intracranial mass effect. 3. Underlying advanced chronic microvascular ischemic changes. Electronically signed by: Helayne Hurst MD 06/06/2024 04:02 AM EDT RP Workstation: [id number]   CT HEAD WO CONTRAST ( ) Result Date: 06/04/2024 EXAM: CT HEAD WITHOUT CONTRAST 06/04/2024 11:45:00 AM TECHNIQUE: CT of the head was performed without the administration of intravenous contrast. Automated exposure control, iterative reconstruction, and/or weight based adjustment of the mA/kV was utilized to reduce the radiation dose to as low as reasonably achievable. COMPARISON: Head CT 11/24/2023 and MRI 08/22/2023. CLINICAL HISTORY: Headache, sudden, severe; Hypertensive emergency. FINDINGS: BRAIN AND VENTRICLES: There is no evidence of an acute infarct, intracranial hemorrhage, mass, midline shift, hydrocephalus, or extra-axial fluid collection. Patchy cerebral white matter hypodensities are similar to the prior CT and nonspecific but compatible with moderate chronic small vessel ischemic disease. There is mild chronic heterogeneity in the deep gray nuclei. There is mild cerebral atrophy. A small chronic left cerebellar infarct is unchanged. Calcified atherosclerosis at the skull base. ORBITS: No acute abnormality. SINUSES: Mucosal thickening in the paranasal sinuses. Clear mastoid air cells. SOFT TISSUES AND SKULL: No  acute soft tissue abnormality. No skull fracture. IMPRESSION: 1. No acute intracranial abnormality. 2. Moderate chronic small vessel ischemic disease. Electronically signed by: Dasie Hamburg MD 06/04/2024 11:58 AM EDT RP Workstation: [id number]   DG Chest Port 1 View Result Date: 06/03/2024 CLINICAL DATA:  Left-sided chest pain and shortness of breath since last night. EXAM: PORTABLE CHEST 1 VIEW COMPARISON:  01/02/2024 FINDINGS: Lungs are adequately inflated demonstrate bilateral minimal hazy prominence of the central pulmonary vessels suggesting mild vascular congestion. No lobar consolidation or significant effusion. Stable borderline cardiomegaly. Remainder of the exam is unchanged. IMPRESSION: Borderline cardiomegaly with suggestion of mild vascular congestion. Electronically Signed   By: Toribio Agreste M.D.   On: 06/03/2024 10:16     PHYSICAL EXAM  Temp:  [98 F (36.7 C)-98.8 F (37.1 C)] 98 F (36.7 C) (10/26 1100) Pulse Rate:  [62-72] 62 (10/26 1100) Resp:  [16-19] 18 (10/26 1100) BP: (137-153)/(73-96) 153/89 (10/26 1100) SpO2:  [92 %-96 %] 96 % (10/26 1100)  General - Well nourished, well developed, in no apparent distress.  Ophthalmologic - fundi not visualized due to noncooperation.  Cardiovascular -  irregularly irregular heart rate and rhythm.  Mental Status -  Level of arousal and orientation to time, place, and person were intact. Language including expression, naming, repetition, comprehension was assessed and found intact.  Mild dysarthria Fund of Knowledge was assessed and was intact.  Cranial Nerves II - XII - II - Visual field intact OU. III, IV, VI - Extraocular movements intact. V - Facial sensation intact bilaterally. VII - Facial movement intact bilaterally. VIII - Hearing & vestibular intact bilaterally. X - Palate elevates symmetrically. XI - Chin turning & shoulder shrug intact bilaterally. XII - Tongue protrusion intact.  Motor Strength - The patient's  strength was normal in all extremities and pronator drift was absent.  Bulk was normal and fasciculations were absent.   Motor Tone - Muscle tone was assessed at the neck and appendages and was normal.  Reflexes - The patient's reflexes were symmetrical in all extremities and he had no pathological reflexes.  Sensory - Light touch, temperature/pinprick were assessed and were symmetrical.    Coordination - The patient had normal movements in the hands and feet with no ataxia or dysmetria.  Tremor was absent.  Gait and Station - deferred.   ASSESSMENT/PLAN Mr. Antolin Belsito is a 68 y.o. male with history of CAD, A-fib noncompliant with Eliquis , alcohol abuse, hypertension, diabetes, OSA admitted for SOB and orthopnea.  Found to have hypertensive emergency, AKI, CHF.  No TNK given due to outside window.    Stroke: Multifocal bilateral small infarct embolic likely secondary to PAF not compliant with Eliquis  CT no acute finding MRI brain small left cerebellum, left periventricular white matter, bilateral MCA/ACA watershed infarcts MRA occlusion of the nondominant left VA at the dural margin. Carotid Doppler unremarkable 2D Echo pending LDL 65 HgbA1c 8.4 UDS positive for THC Eliquis  for VTE prophylaxis Eliquis  (apixaban ) daily prior to admission but not compliant, now on Eliquis  (apixaban ) daily.  Patient counseled to be compliant with his antithrombotic medications Ongoing aggressive stroke risk factor management Therapy recommendations: Pending Disposition: Pending  PAF Noncompliant with medication at home Now on amiodarone  and Eliquis   AKI Creatinine 1.14-1.31-1.74-1 0.91-1.46 On IV fluid Encourage p.o. intake  Diabetes HgbA1c 8.4 goal < 7.0 Uncontrolled Currently on insulin  and Jardiance  CBG monitoring SSI DM education and close PCP follow up  Hypertension Stable on the hand On Corgard 25, metoprolol 25, Imdur  Long term BP goal  normotensive  Hyperlipidemia Home meds: Lipitor  80 and Zetia , not compliant with medication LDL 65, goal < 70 Now on Lipitor  80 Continue statin at discharge  Other Stroke Risk Factors Advanced age ETOH abuse, on B1/FA/MVI, educated on limitation of alcohol use THC abuse, cessation education provided CC Coronary artery disease CHF with SOB and orthopnea  Obstructive sleep apnea, on CPAP at home  Other Active Problems Noncompliant with medication, educated on medication compliance  Hospital day # 4  Neurology will sign off. Please call with questions. Pt will follow up with stroke clinic NP at [facility] in about 4 weeks. Thanks for the consult.   Ary Cummins, MD PhD Stroke Neurology 06/07/2024 2:50 PM    To contact Stroke Continuity provider, please refer to [email or web address]. After hours, contact General Neurology

## 2024-06-07 NOTE — Plan of Care (Signed)
  Problem: Clinical Measurements: Goal: Ability to maintain clinical measurements within normal limits will improve Outcome: Progressing Goal: Diagnostic test results will improve Outcome: Progressing   Problem: Activity: Goal: Risk for activity intolerance will decrease Outcome: Progressing   Problem: Nutrition: Goal: Adequate nutrition will be maintained Outcome: Progressing   Problem: Coping: Goal: Level of anxiety will decrease Outcome: Progressing   Problem: Metabolic: Goal: Ability to maintain appropriate glucose levels will improve Outcome: Progressing

## 2024-06-07 NOTE — Progress Notes (Signed)
 [facility] Progress Note    Assessment/ Plan:   AKI on CKD3a, nonoliguric -Baseline Cr around 1.4-1.6, now 1.9. Suspect underlying CKD is related to DM + HTN. AKI likely related to hemodynamic insults in the context of wide swings in BP. -urine sediment is bland. No renal obstruction on imaging. Had been getting ibuprofen here for some reason--d/c'ed. Would allow for some permissive HTN to allow for renal perfusion--BP's are more stable currently. -Cr down to 1.45 -stopping fluids since he can eat now, encouraged PO hydration -Avoid nephrotoxic medications including NSAIDs and iodinated intravenous contrast exposure unless the latter is absolutely indicated.  Preferred narcotic agents for pain control are hydromorphone , fentanyl , and methadone. Morphine  should not be used. Avoid Baclofen and avoid oral sodium phosphate  and magnesium  citrate based laxatives / bowel preps. Continue strict Input and Output monitoring. Will monitor the patient closely with you and intervene or adjust therapy as indicated by changes in clinical status/labs    HTN emergency -has been seen by cardiology -can uptitrate amlodipine  if needed -BP's more stable/less labile. C/w current mgmt   CVA, new Encephalopathy -per neuro   HFpEF -euvolemic on exam, monitor daily weights & strict I/O   Hypokalemia -replete PRN, k wnl   Uncontrolled Diabetes Mellitus Type 2 with Hyperglycemia -mgmt per primary   Alcohol abuse -on CIWA, per primary   Nothing else to add from an inpatient nephrology perspective. Will sign off. Recommend referral to our office on discharge. Daughter in agreement. Discussed with daughter at bedside.  Subjective:   Patient seen and examined bedside. Daughter at bedside. No acute events overnight. Overall, he reports feeling well.   Objective:   BP (!) 146/87 (BP Location: Right Leg)   Pulse 72   Temp 98.8 F (37.1 C) (Oral)   Resp 16   Ht 6' (1.829 m)   Wt 85.3 kg    SpO2 93%   BMI 25.50 kg/m   Intake/Output Summary (Last 24 hours) at 06/07/2024 0746 Last data filed at 06/07/2024 9288 Gross per 24 hour  Intake 643.59 ml  Output 0 ml  Net 643.59 ml   Weight change:   Physical Exam: Gen: NAD CVS: RRR Resp: CTA BL Abd: soft, NT/ND Ext: no edema Neuro: awake, alert  Imaging: CT CHEST ABDOMEN PELVIS WO CONTRAST Result Date: 06/06/2024 EXAM: CT CHEST, ABDOMEN AND PELVIS WITHOUT CONTRAST 06/06/2024 03:50:52 PM TECHNIQUE: CT of the chest, abdomen and pelvis was performed without the administration of intravenous contrast. Multiplanar reformatted images are provided for review. Automated exposure control, iterative reconstruction, and/or weight based adjustment of the mA/kV was utilized to reduce the radiation dose to as low as reasonably achievable. COMPARISON: CTA chest dated 01/22/2022 and CT abdomen/pelvis dated 01/13/2016. CLINICAL HISTORY: acute encephalopathy, abdominal pain. Chief complaints; Chest Pain; CT CHEST ABDOMEN PELVIS WO CONTRAST; acute encephalopathy, abdominal pain FINDINGS: CHEST: MEDIASTINUM AND LYMPH NODES: Cardiomegaly. Severe 3 vessel coronary atherosclerosis. The central airways are clear. Mild mediastinal lymphadenopathy, including a dominant 14 mm right lower paratracheal node, progressive but possibly reactive. No hilar or axillary lymphadenopathy. LUNGS AND PLEURA: Interlobular septal thickening lower lobe/infrahilar predominant, favoring mild interstitial edema. Small right and trace left pleural effusions. Left lower lobe calcification/granuloma (image 45), new from prior, benign. No pneumothorax. ABDOMEN AND PELVIS: LIVER: The liver is unremarkable. GALLBLADDER AND BILE DUCTS: Status post cholecystectomy. No biliary ductal dilatation. SPLEEN: No acute abnormality. PANCREAS: No acute abnormality. ADRENAL GLANDS: No acute abnormality. KIDNEYS, URETERS AND BLADDER: 3.5 cm left upper pole renal cyst,  benign. No follow-up is  recommended. Per consensus, no follow-up is needed for simple Bosniak type 1 and 2 renal cysts, unless the patient has a malignancy history or risk factors. No stones in the kidneys or ureters. No hydronephrosis. No perinephric or periureteral stranding. Urinary bladder is unremarkable. GI AND BOWEL: Stomach demonstrates no acute abnormality. Normal appendix (image 85). There is no bowel obstruction. REPRODUCTIVE ORGANS: Mild prostatomegaly. PERITONEUM AND RETROPERITONEUM: No ascites. No free air. VASCULATURE: Aorta is normal in caliber. Thoracic aortic atherosclerosis. Abdominal aortic atherosclerosis. ABDOMINAL AND PELVIS LYMPH NODES: No lymphadenopathy. BONES AND SOFT TISSUES: Grade 1 spondylolisthesis at L5-S1. Degenerative changes of the visualized thoracolumbar spine. Small fat-containing right inguinal hernia. No focal soft tissue abnormality. IMPRESSION: 1. Cardiomegaly with mild interstitial edema and small bilateral pleural effusions. 2. Mild mediastinal lymphadenopathy, possibly reactive. Consider follow-up CT chest in 3 months. 3. No acute findings in the abdomen/pelvis. Electronically signed by: Pinkie Pebbles MD 06/06/2024 09:30 PM EDT RP Workstation: [id number]   US  RENAL Result Date: 06/06/2024 EXAM: US  Retroperitoneum Complete, Renal. CLINICAL HISTORY: 409830 AKI (acute kidney injury) 409830. AKI (acute kidney injury) TECHNIQUE: Real-time ultrasound of the retroperitoneum (complete) with image documentation. COMPARISON: None provided. FINDINGS: RIGHT KIDNEY: Right kidney measures 10.4 x 4.4 x 5.5 cm. Total volume equals 131.5 ml. No hydronephrosis, renal stone, or mass visualized. LEFT KIDNEY: Left kidney measures 11.0 x 6.2 x 5.8 cm. Total volume equals 204.4 ml. An anechoic cyst in the upper pole measures 3.5 cm. No hydronephrosis or renal stone. BLADDER: Unremarkable as visualized. IMPRESSION: 1. No hydronephrosis or mass in the right kidney. 2. No hydronephrosis in the left kidney.  Electronically signed by: Norleen Boxer MD 06/06/2024 05:44 PM EDT RP Workstation: [id number]   MR BRAIN WO CONTRAST Result Date: 06/06/2024 EXAM: MRI BRAIN WITHOUT CONTRAST 06/05/2024 08:19:32 PM TECHNIQUE: Multiplanar multisequence MRI of the head/brain was performed without the administration of intravenous contrast. COMPARISON: Brain MRI 08/22/2023. Head CT 06/04/2024. CLINICAL HISTORY: 68 year old male with hypertensive emergency and acute neuro deficit, stroke suspected. FINDINGS: BRAIN AND VENTRICLES: Punctate restricted diffusion is present in the posterior left frontal lobe white matter (central semiovale, series 2 image 39), right hemisphere periventricular white matter (series 2 image 32), superior right perirolandic cortex (image 42), and left superior cerebellum (series 2 image 16). There is background widespread patchy and confluent cerebral white matter chronic T2 and FLAIR hyperintensity. T2 shine through is noted elsewhere, including in the pons. Moderate chronic T2 and FLAIR heterogeneity is also present throughout the bilateral deep gray nuclei. Occasional punctate chronic infarcts are seen in the cerebellum. No cortical encephalomalacia. No definite chronic cerebral blood products on SWI. No intracranial hemorrhage. No mass. No midline shift. No hydrocephalus. The sella is unremarkable. Chronically abnormal distal left vertebral artery flow void is stable (series 5 image 5). Other major vascular flow voids are preserved. Stable cerebral volume. Acute on chronic synchronous small vessel disease is favored rather than a recent embolic event. ORBITS: No acute abnormality. SINUSES AND MASTOIDS: No acute abnormality. BONES AND SOFT TISSUES: Normal marrow signal. No acute soft tissue abnormality. Negative for age visible cervical spine. IMPRESSION: 1. Several punctate acute and subacute infarcts involving both cerebral hemispheres and the cerebellum. Most acute focus in the left centrum semiovale.  Acute on chronic synchronous small vessel disease favored, rather than a recent embolic event. 2. No associated hemorrhage or intracranial mass effect. 3. Underlying advanced chronic microvascular ischemic changes. Electronically signed by: Helayne Hurst MD 06/06/2024 04:02 AM EDT RP Workstation: [id number]  Labs: BMET Recent Labs  Lab 06/03/24 1001 06/04/24 0814 06/05/24 1014 06/06/24 0231 06/06/24 1533 06/07/24 0418  NA 134* 137 138 139 136 137  K 3.3* 3.1* 3.3* 3.1* 3.7 3.7  CL 105 98 102 101 103 105  CO2 22 28 26 27 22  21*  GLUCOSE 234* 234* 159* 70 94 110*  BUN 8 9 18 19 17 16   CREATININE 1.14 1.31* 1.74* 1.91* 1.46* 1.45*  CALCIUM  8.3* 8.9 8.9 9.0 9.0 8.9  PHOS  --   --  4.1  --  3.6  --    CBC Recent Labs  Lab 06/03/24 1001 06/04/24 0814 06/05/24 1014 06/06/24 0231 06/06/24 1533 06/07/24 0418  WBC 6.5   < > 7.1 8.9 8.2 6.4  NEUTROABS 3.9  --   --   --  4.8  --   HGB 16.1   < > 14.1 14.0 14.5 14.0  HCT 49.4   < > 42.2 43.2 44.3 42.7  MCV 88.8   < > 87.7 88.5 89.1 89.0  PLT 175   < > 163 174 174 180   < > = values in this interval not displayed.    Medications:     amLODipine   5 mg Oral Daily   apixaban   5 mg Oral BID   atorvastatin   80 mg Oral Daily   empagliflozin   25 mg Oral Daily   eplerenone   25 mg Oral Daily   folic acid   1 mg Oral Daily   insulin  aspart  0-5 Units Subcutaneous QHS   insulin  aspart  0-9 Units Subcutaneous TID WC   insulin  glargine-yfgn  20 Units Subcutaneous QHS   isosorbide  mononitrate  120 mg Oral Daily   losartan   25 mg Oral Daily   metoprolol succinate  25 mg Oral Daily   multivitamin with minerals  1 tablet Oral Daily   [START ON 06/15/2024] thiamine  (VITAMIN B1) injection  100 mg Intravenous Daily      Ephriam Stank, MD [facility] 06/07/2024, 7:46 AM

## 2024-06-07 NOTE — Plan of Care (Signed)
  Problem: Health Behavior/Discharge Planning: Goal: Ability to manage health-related needs will improve 06/07/2024 1738 by Rufino Sinclair HERO, RN Outcome: Progressing 06/07/2024 1738 by Rufino Sinclair HERO, RN Outcome: Progressing   Problem: Clinical Measurements: Goal: Ability to maintain clinical measurements within normal limits will improve 06/07/2024 1738 by Rufino Sinclair HERO, RN Outcome: Progressing 06/07/2024 1738 by Rufino Sinclair HERO, RN Outcome: Progressing   Problem: Clinical Measurements: Goal: Will remain free from infection 06/07/2024 1738 by Rufino Sinclair HERO, RN Outcome: Progressing 06/07/2024 1738 by Rufino Sinclair HERO, RN Outcome: Progressing

## 2024-06-07 NOTE — Procedures (Signed)
 Routine EEG Report  [name] [name] is a 68 y.o. male with a history of altered mental status who is undergoing an EEG to evaluate for seizures.  Report: This EEG was acquired with electrodes placed according to the International 10-20 electrode system (including Fp1, Fp2, F3, F4, C3, C4, P3, P4, O1, O2, T3, T4, T5, T6, A1, A2, Fz, Cz, Pz). The following electrodes were missing or displaced: none.  The occipital dominant rhythm was 8.5 Hz. This activity is reactive to stimulation. Drowsiness was manifested by background fragmentation; deeper stages of sleep were identified by K complexes and sleep spindles. There was no focal slowing. There were no interictal epileptiform discharges. There were no electrographic seizures identified. There was no abnormal response to photic stimulation; hyperventilation were not performed.  Impression: This EEG was obtained while awake and asleep and is normal.    Clinical Correlation: Normal EEGs, however, do not rule out epilepsy.  Elida Ross, MD [facility] [phone]  If 7pm- 7am, please page neurology on call as listed in AMION.

## 2024-06-08 ENCOUNTER — Inpatient Hospital Stay (HOSPITAL_COMMUNITY)

## 2024-06-08 ENCOUNTER — Other Ambulatory Visit (HOSPITAL_COMMUNITY): Payer: Self-pay

## 2024-06-08 DIAGNOSIS — I6389 Other cerebral infarction: Secondary | ICD-10-CM | POA: Diagnosis not present

## 2024-06-08 DIAGNOSIS — I161 Hypertensive emergency: Secondary | ICD-10-CM | POA: Diagnosis not present

## 2024-06-08 LAB — ECHOCARDIOGRAM COMPLETE
AR max vel: 3.12 cm2
AV Area VTI: 3.13 cm2
AV Area mean vel: 3.03 cm2
AV Mean grad: 3 mmHg
AV Peak grad: 4.9 mmHg
Ao pk vel: 1.11 m/s
Area-P 1/2: 4.63 cm2
Calc EF: 57.4 %
Height: 72 in
S' Lateral: 3 cm
Single Plane A2C EF: 58.2 %
Single Plane A4C EF: 56.9 %
Weight: 3008.84 [oz_av]

## 2024-06-08 LAB — CBC
HCT: 44.4 % (ref 39.0–52.0)
Hemoglobin: 14.4 g/dL (ref 13.0–17.0)
MCH: 29.2 pg (ref 26.0–34.0)
MCHC: 32.4 g/dL (ref 30.0–36.0)
MCV: 90.1 fL (ref 80.0–100.0)
Platelets: 174 K/uL (ref 150–400)
RBC: 4.93 MIL/uL (ref 4.22–5.81)
RDW: 15.7 % — ABNORMAL HIGH (ref 11.5–15.5)
WBC: 6.5 K/uL (ref 4.0–10.5)
nRBC: 0 % (ref 0.0–0.2)

## 2024-06-08 LAB — COMPREHENSIVE METABOLIC PANEL WITH GFR
ALT: 17 U/L (ref 0–44)
AST: 19 U/L (ref 15–41)
Albumin: 2.8 g/dL — ABNORMAL LOW (ref 3.5–5.0)
Alkaline Phosphatase: 64 U/L (ref 38–126)
Anion gap: 9 (ref 5–15)
BUN: 14 mg/dL (ref 8–23)
CO2: 25 mmol/L (ref 22–32)
Calcium: 8.8 mg/dL — ABNORMAL LOW (ref 8.9–10.3)
Chloride: 103 mmol/L (ref 98–111)
Creatinine, Ser: 1.66 mg/dL — ABNORMAL HIGH (ref 0.61–1.24)
GFR, Estimated: 44 mL/min — ABNORMAL LOW (ref >60)
Glucose, Bld: 156 mg/dL — ABNORMAL HIGH (ref 70–99)
Potassium: 4.1 mmol/L (ref 3.5–5.1)
Sodium: 137 mmol/L (ref 135–145)
Total Bilirubin: 0.8 mg/dL (ref 0.0–1.2)
Total Protein: 6.4 g/dL — ABNORMAL LOW (ref 6.5–8.1)

## 2024-06-08 LAB — GLUCOSE, CAPILLARY
Glucose-Capillary: 171 mg/dL — ABNORMAL HIGH (ref 70–99)
Glucose-Capillary: 147 mg/dL — ABNORMAL HIGH (ref 70–99)
Glucose-Capillary: 240 mg/dL — ABNORMAL HIGH (ref 70–99)

## 2024-06-08 MED ORDER — POLYETHYLENE GLYCOL 3350 17 GM/SCOOP PO POWD
17.0000 g | Freq: Every day | ORAL | 0 refills | Status: DC | PRN
  Filled 2024-06-08: qty 238, 14d supply, fill #0

## 2024-06-08 MED ORDER — AMLODIPINE BESYLATE 10 MG PO TABS
10.0000 mg | ORAL_TABLET | Freq: Every day | ORAL | 0 refills | Status: DC
  Filled 2024-06-08: qty 30, 30d supply, fill #0

## 2024-06-08 MED ORDER — STROKE: EARLY STAGES OF RECOVERY BOOK
Freq: Once | Status: AC
  Filled 2024-06-08: qty 1

## 2024-06-08 MED ORDER — LANTUS SOLOSTAR 100 UNIT/ML ~~LOC~~ SOPN
20.0000 [IU] | PEN_INJECTOR | Freq: Every day | SUBCUTANEOUS | 0 refills | Status: DC
  Filled 2024-06-08: qty 6, 30d supply, fill #0

## 2024-06-08 MED ORDER — INSULIN PEN NEEDLE 32G X 4 MM MISC
0 refills | Status: AC
  Filled 2024-06-08: qty 100, 30d supply, fill #0

## 2024-06-08 MED ORDER — METOPROLOL SUCCINATE ER 25 MG PO TB24
25.0000 mg | ORAL_TABLET | Freq: Every day | ORAL | 0 refills | Status: DC
  Filled 2024-06-08: qty 30, 30d supply, fill #0

## 2024-06-08 MED ORDER — TORSEMIDE 20 MG PO TABS
40.0000 mg | ORAL_TABLET | Freq: Every day | ORAL | 0 refills | Status: DC | PRN
  Filled 2024-06-08: qty 60, 30d supply, fill #0

## 2024-06-08 MED ORDER — FOLIC ACID 1 MG PO TABS
1.0000 mg | ORAL_TABLET | Freq: Every day | ORAL | 0 refills | Status: DC
  Filled 2024-06-08: qty 30, 30d supply, fill #0

## 2024-06-08 MED ORDER — THIAMINE HCL 100 MG PO TABS
100.0000 mg | ORAL_TABLET | Freq: Every day | ORAL | 0 refills | Status: DC
  Filled 2024-06-08: qty 30, 30d supply, fill #0

## 2024-06-08 MED ORDER — ENSURE PLUS HIGH PROTEIN PO LIQD
237.0000 mL | Freq: Two times a day (BID) | ORAL | Status: DC
  Administered 2024-06-08: 237 mL via ORAL

## 2024-06-08 MED ORDER — AMLODIPINE BESYLATE 10 MG PO TABS
10.0000 mg | ORAL_TABLET | Freq: Every day | ORAL | Status: DC
  Administered 2024-06-08: 10 mg via ORAL
  Filled 2024-06-08: qty 1

## 2024-06-08 MED ORDER — ADULT MULTIVITAMIN W/MINERALS CH
1.0000 | ORAL_TABLET | Freq: Every day | ORAL | 0 refills | Status: DC
  Filled 2024-06-08: qty 30, 30d supply, fill #0

## 2024-06-08 NOTE — Progress Notes (Signed)
 Went over discharge paperwork with patients daughter. All questions answered. PIV and telemetry removed. All belongings at bedside.

## 2024-06-08 NOTE — Plan of Care (Signed)

## 2024-06-08 NOTE — Progress Notes (Incomplete)
 Progress Note   Patient: [name] [name] FMW:[id number] DOB: [birth year] DOA: 06/03/2024     5 DOS: the patient was seen and examined on 06/08/2024   Brief hospital course:  68 y.o. male with medical history significant of hypertension, atrial fibrillation on apixaban , hyperlipidemia, poorly controlled type 2 diabetes, CKD (baseline creatinine appears to be 1.4-1.6), and recent admission in 08/2023 for expressive aphasia 2/2 TIA/HTN emergency p/w hypertensive emergency.   The patient reported experiencing shortness of breath and inability to sleep the previous night, stating they were up all night fighting for air. The patient mentioned attempting to alleviate the symptoms by sitting outside on the porch multiple times but was unable to obtain relief. The patient decided to seek medical help in the morning and called their daughter around eight o'clock. The patient indicated missing several medications but was unsure of which ones or how many days they had missed.   In the ED, the patient was hypertensive to 170-190s and tachypneic to 27 on RA. Labs notable for  K 3.3, BNP 321, and troponin 81 > 75. EDP started IV lasix  20mg  x1 and nitroglycerin  gtt and requested medicine admission.  He was ultimately titrated off nitroglycerin  drip and onto PO antihypertensives. He was noted to develop acute encephalopathy on the evening of 10/24. MRI brain showed acute on chronic multifocal ischemic strokes possibly small vessel disease. Neurology was consulted. Workup was broadened to include CT C/A/P, blood cultures, and UA. AKI worsening possibly in the setting of hypertensive emergency and resumption of antihypertensives/diuretics.   Assessment and Plan:  # Hypertensive emergency - resolved #Primary hypertension - Presented with elevated blood pressures, noted as high as 217/125 in the setting of poor medication compliance. Reports taking his medications 2-3 days a week only. Associated symptoms  included chest discomfort, shortness of breath and headache  Home antihypertensives include losartan  25 mg daily, hydralazine  50 mg twice daily, eplerenone  25 mg daily, Imdur  120 mg daily, torsemide  40 mg twice daily - Was started on nitroglycerin  drip in the ED, ultimately weaned off with PO antihypertensives started - Cardiology consulted and assisted with medication management/consolidation to aid with better outpatient adherence - Increased amlodipine  to 10 mg daily. Continue losartan  25 mg daily, Imdur  120 mg daily, eplerenone  25 mg daily, Toprol-XL 25 mg daily - Torsemide  held given AKI  # Acute Encephalopathy - resolved # Acute multifocal ischemic strokes likely embolic secondary to PAF not compliant with Eliquis  - Patient's daughter reported several episodes of confusion and word-searching on 10/25 , which is unusual for him and consistent with deficits from TIA in 08/2023 - MRI brain showed several punctate acute and subacute infarcts involving both cerebral hemispheres and the cerebellum. Most acute focus in the left centrum semiovale. Acute on chronic synchronous small vessel disease favored, rather than a recent embolic event. No associated hemorrhage or intracranial mass effect. Underlying advanced chronic microvascular ischemic changes. - Given acute nature of encephalopathy, history of UTI and current abdominal pain, will broaden work-up to include UA, blood cultures, CT chest/abd/pelvis, and lactic acid, which was unrevealing. - Neurology consulted - suspected confusion likely multifactorial including delirium and uncontrolled cormorbidities  - MRA showed occlusion of nondominant left vertebral artery at the dural margin. - US  carotid unremarkable - EEG normal - TTE pending - Continue Lipitor  80 mg daily and Eliquis  5 mg BID - Fall precautions - Delirium precautions  # AKI - Cr elevated to 1.91 from 1.14 on admission. However, baseline Cr appears to be around 1.40-1.60s  most  recently - Likely multifactorial secondary to hypertensive emergency on presentation and now Losartan , Torsemide , and Jardiance  use - Nephrology consulted, Dr. Tobie indicated likely secondary to hemodynamic insults in the setting of wide swings in BP, and to allow for some permissive HTN. Follow-up with nephro outpatient. - Cr 1.66 today, consistent with baseline - Fluids discontinued as patient is eating, encouraging oral hydration  # Right-sided headache - improved - Improving with better BP control - No appreciable neurologic deficits on exam although he reports blurry vision that is not new  - CT head w/o contrast ordered to rule out any intracranial bleeding  # HFpEF (LVEF 60-65%) - Was recently seen in the cardiology clinic. Given his medical noncompliance, he reports DOE, 3 pillow orthopnea, frequent PND necessitating him to walk out to his front porch for relief - BNP on admission 321 - CXR showed findings consistent with mild vascular congestion and stable borderline cardiomegaly.  - Received IV Lasix  20 mg once in the ED - Cardiology signed off - follow up in 6-8 weeks with Dr. Rolan - Continue home Jardiance  25 mg daily, eplerenone  25 mg daily,  - Held torsemide  40 mg given AKI. Cardiology recommending PRN Torsemide    # Hypokalemia - resolved  # Paroxysmal Afib/flutter - Currently rate controled without medications. Per cardiology notes, he is no longer on amiodarone  or sotalol  - Continue Toprol-XL 25 mg daily  # T2DM - Hgb A1c 8.4 (06/04/2024) - Follows with Dr. Dartha with outpatient endocrinology - Outpatient Diabetes medications: Lantus  30 units every day, Humalog  0-10 units TID, Jardiance  25 mg every day, Metformin  1 gram with breakfast, Freestyle Libre 3  - Diabetes educator following  - Reduced Semglee  to 20 units nightly  - Continue SSI ACHS  # Chronic alcohol use - Has prior history of alcohol abuse, more recently drinks 1 beer a day with up to 3-4 beers plus  1-2 drinks of liquor while watching football on weekends - CIWA protocol initiated - Thiamine , folate, and multivitamin supplementation - TOC consulted for alcohol cessation counseling   {Tip this will not be part of the note when signed Body mass index is 25.5 kg/m. , ,  (Optional):26781}  Subjective: Patient seen at bedside this morning. Daughter at bedside. He is much improved with hardly any confusion. He is oriented to person, place, time, and situation. He denies any chest pain, SOB, confusion, headache, dizziness. He is pending further imaging for stroke workup.  Physical Exam: Vitals:   06/07/24 1927 06/07/24 2312 06/08/24 0518 06/08/24 0737  BP: (!) 139/97 (!) 158/97 (!) 165/92 (!) 145/89  Pulse: 68 65 60   Resp: 19 20 19 18   Temp: 98.7 F (37.1 C) 98.5 F (36.9 C) 98.6 F (37 C) 98.4 F (36.9 C)  TempSrc: Oral Oral Oral Oral  SpO2: 93% 95% 93% 91%  Weight:      Height:        Physical Exam Constitutional:      General: He is not in acute distress.    Appearance: He is not ill-appearing.  Cardiovascular:     Rate and Rhythm: Normal rate. Rhythm irregularly irregular.     Heart sounds: Normal heart sounds. No murmur heard. Pulmonary:     Effort: Pulmonary effort is normal. No respiratory distress.     Breath sounds: Normal breath sounds.  Abdominal:     General: Bowel sounds are normal.     Palpations: Abdomen is soft.     Tenderness: There is no  abdominal tenderness.  Musculoskeletal:     Right lower leg: No edema.     Left lower leg: No edema.  Skin:    General: Skin is warm and dry.     Capillary Refill: Capillary refill takes less than 2 seconds.  Neurological:     Mental Status: He is alert, oriented to person, place, and time and easily aroused. Mental status is at baseline.     Cranial Nerves: No cranial nerve deficit.     Sensory: Sensation is intact.     Motor: Motor function is intact.     Coordination: Coordination is intact.     Family  Communication: Patient's daughter, [name], at bedside   Disposition: Status is: Inpatient Remains inpatient appropriate because: pending completion of neurologic workup  Planned Discharge Destination: Home  DVT prophylaxis: SCDs Start: 06/03/24 1548 apixaban  (ELIQUIS ) tablet 5 mg    {Tip this will not be part of the note when signed  DVT Prophylaxis  .Apixaban  (eliquis ) tablet 5 mg , Scds  (Optional):26781}   Time spent: 40 minutes  Author: Kyiah Canepa Al-Sultani, MD 06/08/2024 7:47 AM  For on call review [email or web address].

## 2024-06-08 NOTE — Evaluation (Signed)
 Occupational Therapy Evaluation Patient Details Name: [name] [name] MRN: [id number] DOB: 1956-01-03 Today's Date: 06/08/2024   History of Present Illness   68 y/o M presenting toED on 10/22 with SOB, admitted for hypertensive emergency it setting of poor medication compliance. Pt with incr confusion, MRI with several acute and subacute punctate infarcts in bil cerebral hemispheres and cerebellum. Most acute focusi n L centrum semiovale.    PMH includes HTN, A fib, HLD, DM2, CKD, recent admit 08/2023 for expressive aphasia 2/2 TIA/HTN emergency.     Clinical Impressions Pt lives alone, ind at baseline with ADLs and functional mobility, though reports difficulty with med mgmt lately. Pt with decr cognition, needs mod sequencing cues while dual tasking and to recall reason for admission. Pt currently needs up to CGA for ADLs, and supervision for transfers without AD. Pt presenting with impairments listed below, will follow acutely. Recommend HHOT at d/c pending progression.     If plan is discharge home, recommend the following:   A little help with walking and/or transfers;A little help with bathing/dressing/bathroom;Assist for transportation;Supervision due to cognitive status;Direct supervision/assist for medications management;Direct supervision/assist for financial management;Assistance with cooking/housework     Functional Status Assessment   Patient has had a recent decline in their functional status and demonstrates the ability to make significant improvements in function in a reasonable and predictable amount of time.     Equipment Recommendations   None recommended by OT     Recommendations for Other Services   PT consult     Precautions/Restrictions   Precautions Precautions: Fall Restrictions Weight Bearing Restrictions Per Provider Order: No     Mobility Bed Mobility               General bed mobility comments: OOB in bathroom upon arrival     Transfers Overall transfer level: Needs assistance Equipment used: None Transfers: Sit to/from Stand Sit to Stand: Supervision                  Balance Overall balance assessment: Mild deficits observed, not formally tested                                         ADL either performed or assessed with clinical judgement   ADL Overall ADL's : Needs assistance/impaired Eating/Feeding: Set up;Sitting   Grooming: Wash/dry hands;Supervision/safety;Standing   Upper Body Bathing: Contact guard assist;Sitting;Standing   Lower Body Bathing: Contact guard assist;Sitting/lateral leans;Sit to/from stand   Upper Body Dressing : Standing;Sitting;Minimal assistance Upper Body Dressing Details (indicate cue type and reason): for orientation of gown to don on backside Lower Body Dressing: Contact guard assist;Sit to/from stand;Sitting/lateral leans   Toilet Transfer: Ambulation;Regular Toilet;Supervision/safety   Toileting- Clothing Manipulation and Hygiene: Supervision/safety       Functional mobility during ADLs: Supervision/safety       Vision Baseline Vision/History: 4 Cataracts;1 Wears glasses Additional Comments: glasses not present and pt reports he needs new glasses, and repeat cataract surgery, functional for BADL and mobility tasks     Perception         Praxis         Pertinent Vitals/Pain Pain Assessment Pain Assessment: No/denies pain     Extremity/Trunk Assessment Upper Extremity Assessment Upper Extremity Assessment: Overall WFL for tasks assessed   Lower Extremity Assessment Lower Extremity Assessment: Defer to PT evaluation   Cervical / Trunk Assessment Cervical /  Trunk Assessment: Normal   Communication Communication Communication: No apparent difficulties   Cognition Arousal: Alert Behavior During Therapy: WFL for tasks assessed/performed Cognition: Cognition impaired   Orientation impairments: Situation (unaware of  stroke or elevated BP) Awareness: Online awareness impaired Memory impairment (select all impairments): Short-term memory Attention impairment (select first level of impairment): Sustained attention   OT - Cognition Comments: impaired dual tasking, difficulty following instructions while also having family members on phone                 Following commands: Intact       Cueing  General Comments   Cueing Techniques: Verbal cues  VSS on RA   Exercises     Shoulder Instructions      Home Living Family/patient expects to be discharged to:: Private residence Living Arrangements: Alone Available Help at Discharge: Family;Available PRN/intermittently (daughter) Type of Home: House Home Access: Level entry     Home Layout: One level     Bathroom Shower/Tub: Tub/shower unit         Home Equipment:  (walking stick)      Lives With: Alone    Prior Functioning/Environment Prior Level of Function : Independent/Modified Independent;Driving             Mobility Comments: prior falls due to drinking too much      OT Problem List: Decreased strength   OT Treatment/Interventions: Self-care/ADL training;Therapeutic exercise;Energy conservation;DME and/or AE instruction;Therapeutic activities;Patient/family education;Balance training      OT Goals(Current goals can be found in the care plan section)   Acute Rehab OT Goals Patient Stated Goal: to go home OT Goal Formulation: With patient Time For Goal Achievement: 06/22/24 Potential to Achieve Goals: Good ADL Goals Pt Will Perform Lower Body Dressing: Independently;sitting/lateral leans Pt Will Perform Tub/Shower Transfer: Tub transfer;Shower transfer;Independently;ambulating Additional ADL Goal #1: pt will recieve passing score on pillbox assessment in prep for IADLs   OT Frequency:  Min 2X/week    Co-evaluation              AM-PAC OT 6 Clicks Daily Activity     Outcome Measure Help from  another person eating meals?: None Help from another person taking care of personal grooming?: A Little Help from another person toileting, which includes using toliet, bedpan, or urinal?: A Little Help from another person bathing (including washing, rinsing, drying)?: A Little Help from another person to put on and taking off regular upper body clothing?: A Little Help from another person to put on and taking off regular lower body clothing?: A Little 6 Click Score: 19   End of Session Equipment Utilized During Treatment: Gait belt Nurse Communication: Mobility status  Activity Tolerance: Patient tolerated treatment well Patient left: in chair;with call bell/phone within reach  OT Visit Diagnosis: Unsteadiness on feet (R26.81);Other abnormalities of gait and mobility (R26.89);Muscle weakness (generalized) (M62.81);History of falling (Z91.81)                Time: 9186-9164 OT Time Calculation (min): 22 min Charges:  OT General Charges $OT Visit: 1 Visit OT Evaluation $OT Eval Moderate Complexity: 1 Mod  Walden Statz K, OTD, OTR/L [employer] Preferred Acute Rehab [phone]   Laneta POUR Koonce 06/08/2024, 8:43 AM

## 2024-06-08 NOTE — Progress Notes (Signed)
  Echocardiogram 2D Echocardiogram has been performed.  [name] [name] 06/08/2024, 9:15 AM

## 2024-06-08 NOTE — TOC Transition Note (Signed)
 Transition of Care [facility]) - Discharge Note   Patient Details  Name: [name] [name] MRN: [id number] Date of Birth: May 16, 1956  [facility]) CM/SW Contact:  Roxie KANDICE Stain, RN Phone Number: 06/08/2024, 3:40 PM   Clinical Narrative:    Patient stable for discharge.  Patient will be staying with all 3 daughters at separate times. Patient has used [facility] and is agreeable to use them again.  Amy with [facility] accepted referral. Patient wants to recommended rollator. Notified Zack with [facility] of rollator order.  PCP confirmed.  Patient has transportation to apts.     Final next level of care: Home w Home Health Services Barriers to Discharge: Barriers Resolved   Patient Goals and CMS Choice Patient states their goals for this hospitalization and ongoing recovery are:: return home CMS Medicare.gov Compare Post Acute Care list provided to:: Patient Choice offered to / list presented to : Patient      Discharge Placement             home          Discharge Plan and Services Additional resources added to the After Visit Summary for                  DME Arranged: Walker rolling with seat DME Agency: [facility] Date DME Agency Contacted: 06/08/24 Time DME Agency Contacted: 1539 Representative spoke with at DME Agency: Zack HH Arranged: PT, OT HH Agency: [facility] Date [facility] Agency Contacted: 06/08/24 Time HH Agency Contacted: 1539 Representative spoke with at [facility] Agency: Amy  Social Drivers of Health (SDOH) Interventions SDOH Screenings   Food Insecurity: No Food Insecurity (06/03/2024)  Housing: Low Risk  (06/03/2024)  Transportation Needs: No Transportation Needs (06/03/2024)  Utilities: Not At Risk (06/03/2024)  Alcohol Screen: Low Risk  (07/23/2022)  Depression (PHQ2-9): Medium Risk (01/02/2024)  Financial Resource Strain: Low Risk  (07/23/2022)  Physical Activity: Inactive (07/23/2022)  Social Connections: Socially  Isolated (06/03/2024)  Stress: No Stress Concern Present (07/23/2022)  Tobacco Use: Medium Risk (06/04/2024)     Readmission Risk Interventions    06/08/2024    3:39 PM  Readmission Risk Prevention Plan  Transportation Screening Complete  PCP or Specialist Appt within 5-7 Days Complete  Home Care Screening Complete  Medication Review (RN CM) Complete

## 2024-06-08 NOTE — Evaluation (Signed)
 Physical Therapy Evaluation Patient Details Name: [name] [name] MRN: [id number] DOB: [birth year] Today's Date: 06/08/2024  History of Present Illness  68 y/o M presenting toED on 10/22 with SOB, admitted for hypertensive emergency it setting of poor medication compliance. Pt with incr confusion, MRI with several acute and subacute punctate infarcts in bil cerebral hemispheres and cerebellum. Most acute focusi n L centrum semiovale.    PMH includes HTN, A fib, HLD, DM2, CKD, recent admit 08/2023 for expressive aphasia 2/2 TIA/HTN emergency.  Clinical Impression  Pt admitted with above diagnosis. Pt needed assist at least CGA without device due to slight imbalance. Scored 15/24 on DGI suggesting risk of falls without device.  Trialed rollator and pt was much safer with rollator needing no assist and Modif I. Recommend rollator use at home and intermittent supervision as well as HHPT. Plan is for home today. Updated CM and nurse.  Pt currently with functional limitations due to the deficits listed below (see PT Problem List). Pt will benefit from acute skilled PT to increase their independence and safety with mobility to allow discharge.           If plan is discharge home, recommend the following: Assistance with cooking/housework;Assist for transportation;Direct supervision/assist for medications management   Can travel by private vehicle        Equipment Recommendations Rollator (4 wheels)  Recommendations for Other Services       Functional Status Assessment Patient has had a recent decline in their functional status and demonstrates the ability to make significant improvements in function in a reasonable and predictable amount of time.     Precautions / Restrictions Precautions Precautions: Fall Restrictions Weight Bearing Restrictions Per Provider Order: No      Mobility  Bed Mobility Overal bed mobility: Independent                  Transfers Overall transfer  level: Needs assistance Equipment used: None, Rollator (4 wheels) Transfers: Sit to/from Stand Sit to Stand: Supervision           General transfer comment: Supervision for safety. Spent incr time educating pt on proper use of rollator.  Pt was able to demonstrate locking and unlocking brakes appropriately for sit to stand and stand to sit.  Discussed hand placement options as well.    Ambulation/Gait Ambulation/Gait assistance: Modified independent (Device/Increase time) Gait Distance (Feet): 350 Feet Assistive device: Rollator (4 wheels), None Gait Pattern/deviations: Step-through pattern, Decreased stride length, Drifts right/left, Staggering right, Staggering left   Gait velocity interpretation: <1.31 ft/sec, indicative of household ambulator   General Gait Details: Without rollator pt veers right and left at times and with challenges needing closer to CGA.  However with rollator pt performs Modif I. Pt educated on locking and unlocking brakes and pt returns demonstration several times during session.  Stairs            Wheelchair Mobility     Tilt Bed    Modified Rankin (Stroke Patients Only)       Balance                                 Standardized Balance Assessment Standardized Balance Assessment : Dynamic Gait Index   Dynamic Gait Index Level Surface: Mild Impairment Change in Gait Speed: Mild Impairment Gait with Horizontal Head Turns: Mild Impairment Gait with Vertical Head Turns: Mild Impairment Gait and Pivot Turn: Mild Impairment Step  Over Obstacle: Mild Impairment Step Around Obstacles: Mild Impairment Steps: Moderate Impairment Total Score: 15       Pertinent Vitals/Pain Pain Assessment Pain Assessment: No/denies pain    Home Living Family/patient expects to be discharged to:: Private residence Living Arrangements: Alone Available Help at Discharge: Family;Available PRN/intermittently (daughter) Type of Home: House Home  Access: Level entry       Home Layout: One level Home Equipment:  (walking stick)      Prior Function Prior Level of Function : Independent/Modified Independent;Driving             Mobility Comments: prior falls due to drinking too much       Extremity/Trunk Assessment   Upper Extremity Assessment Upper Extremity Assessment: Defer to OT evaluation    Lower Extremity Assessment Lower Extremity Assessment: Generalized weakness    Cervical / Trunk Assessment Cervical / Trunk Assessment: Normal  Communication   Communication Communication: No apparent difficulties    Cognition Arousal: Alert Behavior During Therapy: WFL for tasks assessed/performed                             Following commands: Intact       Cueing Cueing Techniques: Verbal cues     General Comments General comments (skin integrity, edema, etc.): VSS on RA    Exercises     Assessment/Plan    PT Assessment Patient needs continued PT services  PT Problem List Decreased balance;Decreased activity tolerance;Decreased mobility;Decreased knowledge of use of DME;Decreased safety awareness;Decreased knowledge of precautions;Cardiopulmonary status limiting activity       PT Treatment Interventions DME instruction;Gait training;Functional mobility training;Therapeutic activities;Balance training;Therapeutic exercise;Patient/family education    PT Goals (Current goals can be found in the Care Plan section)  Acute Rehab PT Goals Patient Stated Goal: to go home today PT Goal Formulation: With patient Time For Goal Achievement: 06/22/24 Potential to Achieve Goals: Good    Frequency Min 2X/week     Co-evaluation               AM-PAC PT 6 Clicks Mobility  Outcome Measure Help needed turning from your back to your side while in a flat bed without using bedrails?: None Help needed moving from lying on your back to sitting on the side of a flat bed without using bedrails?:  None Help needed moving to and from a bed to a chair (including a wheelchair)?: A Little Help needed standing up from a chair using your arms (e.g., wheelchair or bedside chair)?: A Little Help needed to walk in hospital room?: A Little Help needed climbing 3-5 steps with a railing? : A Lot 6 Click Score: 19    End of Session Equipment Utilized During Treatment: Gait belt Activity Tolerance: Patient tolerated treatment well Patient left: in bed;with call bell/phone within reach;with bed alarm set;with family/visitor present Nurse Communication: Mobility status PT Visit Diagnosis: Muscle weakness (generalized) (M62.81)    Time: 8974-8954 PT Time Calculation (min) (ACUTE ONLY): 20 min   Charges:   PT Evaluation $PT Eval Moderate Complexity: 1 Mod   PT General Charges $$ ACUTE PT VISIT: 1 Visit         Irby Fails M,PT Acute Rehab Services [phone]   Stephane JULIANNA Bevel 06/08/2024, 11:00 AM

## 2024-06-09 ENCOUNTER — Other Ambulatory Visit: Payer: Self-pay | Admitting: "Endocrinology

## 2024-06-09 ENCOUNTER — Telehealth: Payer: Self-pay

## 2024-06-09 ENCOUNTER — Telehealth: Payer: Self-pay | Admitting: Family Medicine

## 2024-06-09 DIAGNOSIS — Z8639 Personal history of other endocrine, nutritional and metabolic disease: Secondary | ICD-10-CM

## 2024-06-09 NOTE — Telephone Encounter (Unsigned)
 Copied from CRM [id number]. Topic: General - Other >> Jun 09, 2024  1:58 PM Mesmerise C wrote: Reason for CRM: Marval from [facility] checking to make sure Dr. Mercer will sign off on orders before sending them over just wants to verify before hand can be called back at [phone]

## 2024-06-10 NOTE — Telephone Encounter (Signed)
 Spoke with debbie from [facility] she is faxing over orders for the patient for P.T.

## 2024-06-10 NOTE — Transitions of Care (Post Inpatient/ED Visit) (Signed)
 06/10/2024  Name: [name] [name] MRN: [id number] DOB: Jun 08, 1956  Today's TOC FU Call Status: Today's TOC FU Call Status:: Successful TOC FU Call Completed TOC FU Call Complete Date: 06/09/24 Patient's Name and Date of Birth confirmed.  Transition Care Management Follow-up Telephone Call Date of Discharge: 06/08/24 Discharge Facility: [facility]) Type of Discharge: Inpatient Admission Primary Inpatient Discharge Diagnosis:: Hypertensive emergency without congestive heart failure How have you been since you were released from the hospital?: Better (reports feeling better today) Any questions or concerns?: No  Items Reviewed: Did you receive and understand the discharge instructions provided?: Yes Medications obtained,verified, and reconciled?: Yes (Medications Reviewed) Any new allergies since your discharge?: No Dietary orders reviewed?: Yes Type of Diet Ordered:: low sodium heart healthy diet Do you have support at home?: Yes People in Home [RPT]: child(ren), adult Name of Support/Comfort Primary Source: [name]  Medications Reviewed Today: Medications Reviewed Today     Reviewed by Rumalda Alan PENNER, RN (Registered Nurse) on 06/09/24 at 1053  Med List Status: <None>   Medication Order Taking? Sig Documenting Provider Last Dose Status Informant  acetaminophen  (TYLENOL ) 500 MG tablet [id number] Yes Take 1,000 mg by mouth every 6 (six) hours as needed (pain). [provider]  Active Self  albuterol  (VENTOLIN  HFA) 108 (90 Base) MCG/ACT inhaler [id number] Yes Inhale 1-2 puffs into the lungs every 6 (six) hours as needed for wheezing or shortness of breath. Babara Greig GAILS, PA-C  Active Self  amLODipine  (NORVASC ) 10 MG tablet [id number] Yes Take 1 tablet (10 mg total) by mouth daily. Al-Sultani, Anmar, MD  Active   atorvastatin  (LIPITOR ) 80 MG tablet [id number] Yes TAKE 1 TABLET BY MOUTH ONCE DAILY Bensimhon, Toribio SAUNDERS, MD  Active Self  Blood Glucose Monitoring Suppl DEVI  [id number] Yes 1 each by Does not apply route in the morning, at noon, and at bedtime. May substitute to any manufacturer covered by patient's insurance. Mercer Clotilda SAUNDERS, MD  Active Self  Continuous Glucose Receiver (FREESTYLE LIBRE 3 READER) DEVI [id number] Yes 1 Device by Does not apply route continuous. Mercer Clotilda SAUNDERS, MD  Active Self  Continuous Glucose Sensor (FREESTYLE LIBRE 3 SENSOR) [place] [id number] Yes 1 Device by Does not apply route every 14 (fourteen) days. Dartha Ernst, MD  Active Self  ELIQUIS  5 MG TABS tablet [id number] Yes TAKE 1 TABLET BY MOUTH TWICE DAILY McLean, Dalton S, MD  Active Self           Med Note (SATTERFIELD, TEENA BRAVO   Wed Jun 03, 2024 10:07 PM) @1800   eplerenone  (INSPRA ) 25 MG tablet [id number] Yes Take 1 tablet (25 mg total) by mouth daily. Proctor, [place], [place]  Active Self  ezetimibe  (ZETIA ) 10 MG tablet [id number] Yes TAKE 1 TABLET BY MOUTH ONCE DAILY McLean, Dalton S, MD  Active Self  folic acid  (FOLVITE ) 1 MG tablet [id number] Yes Take 1 tablet (1 mg total) by mouth daily. Al-Sultani, Anmar, MD  Active   Glucose Blood (BLOOD GLUCOSE TEST STRIPS) STRP [id number] Yes 1 each by In Vitro route in the morning, at noon, and at bedtime. May substitute to any manufacturer covered by patient's insurance. Mercer Clotilda SAUNDERS, MD  Active Self  insulin  glargine (LANTUS  SOLOSTAR) 100 UNIT/ML Solostar Pen [id number] Yes Inject 20 Units into the skin at bedtime. Al-Sultani, Anmar, MD  Active   insulin  lispro (HUMALOG ) 100 UNIT/ML injection [id number] Yes Inject 1 Units into the skin 3 (three) times daily with meals. Sliding scale 70-140=0 Units 141-170=1 Units  171-200=2 Units 201-230=3 Units 231-260=4 Units 261-290=5 Units 291-320=6 Units 231-350=7 Units 351-380=8 Units Over 380 10 units [provider]  Active Self  Insulin  Pen Needle 32G X 4 MM MISC [id number] Yes Use as directed with Lantus . Al-Sultani, Anmar, MD  Active   isosorbide  mononitrate (IMDUR ) 120 MG 24 hr  tablet [id number] Yes TAKE 1 TABLET BY MOUTH ONCE DAILY McLean, Dalton S, MD  Active Self  JARDIANCE  25 MG TABS tablet [id number] Yes TAKE 1 TABLET BY MOUTH ONCE DAILY CONTACT OFFICE FOR APPOINTMENT Dartha Ernst, MD  Active Self  Lancets Misc. MISC [id number] Yes 1 each by Does not apply route in the morning, at noon, and at bedtime. May substitute to any manufacturer covered by patient's insurance. Mercer Clotilda SAUNDERS, MD  Active Self  losartan  (COZAAR ) 25 MG tablet [id number]  Take 1 tablet (25 mg total) by mouth daily.  Patient not taking: Reported on 06/09/2024   [name], FNP  Active Self  metFORMIN  (GLUCOPHAGE ) 1000 MG tablet [id number] Yes TAKE 1 TABLET BY MOUTH ONCE DAILY WITH BREAKFAST *PATIENT NEEDS APPOINTMENT* Motwani, Komal, MD  Active Self  metoprolol succinate (TOPROL-XL) 25 MG 24 hr tablet [id number] Yes Take 1 tablet (25 mg total) by mouth daily. Al-Sultani, Anmar, MD  Active   Multiple Vitamin (MULTIVITAMIN WITH MINERALS) TABS tablet [id number] Yes Take 1 tablet by mouth daily. Al-Sultani, Anmar, MD  Active   nitroGLYCERIN  (NITROSTAT ) 0.4 MG SL tablet [id number] Yes DISSOLVE 1 TABLET UNDER THE TONGUE AS NEEDED FOR CHEST PAIN EVERY 5 MINUTES UP TO 3 TIMES. IF NO RELIEF CALL 911.  Patient taking differently: DISSOLVE 1 TABLET UNDER THE TONGUE AS NEEDED FOR CHEST PAIN EVERY 5 MINUTES UP TO 3 TIMES. IF NO RELIEF CALL 911.   Bensimhon, Toribio SAUNDERS, MD  Active Self  polyethylene glycol powder (GLYCOLAX/MIRALAX) 17 GM/SCOOP powder [id number]  Dissolve 1 capful (17g) in 4-8 ounces of liquid and take by mouth daily as needed for mild constipation.  Patient not taking: Reported on 06/09/2024   Al-Sultani, Anmar, MD  Active   thiamine  (VITAMIN B1) 100 MG tablet [id number] Yes Take 1 tablet (100 mg total) by mouth daily. Al-Sultani, Anmar, MD  Active   torsemide  (DEMADEX ) 20 MG tablet [id number] Yes Take 2 tablets (40 mg total) by mouth daily as needed (as needed for weight gain >2 lbs in 24 hrs or  > 5 lbs in a week , increased leg swelling, increased shortness of breath. May take another dose if symptoms persist and contact provider). Al-Sultani, Anmar, MD  Active            Today's Vitals   06/09/24 1108 06/09/24 1110  BP:  (!) 154/71  Pulse:  64  Weight: 187 lb 12.8 oz (85.2 kg) 187 lb 12.8 oz (85.2 kg)  PainSc:  0-No pain    Home Care and Equipment/Supplies: Were Home Health Services Ordered?: Yes Name of Home Health Agency:: [facility] Has Agency set up a time to come to your home?: No Any new equipment or medical supplies ordered?: Yes Name of Medical supply agency?: [facility] Were you able to get the equipment/medical supplies?: No (will be delivered) Do you have any questions related to the use of the equipment/supplies?: No  Functional Questionnaire: Do you need assistance with bathing/showering or dressing?: Yes (family) Do you need assistance with meal preparation?: Yes (family) Do you need assistance with eating?: No Do you have difficulty maintaining continence: Yes (wearing depends) Do you need assistance with getting out of  bed/getting out of a chair/moving?: Yes (rocks to get up from chair. waiting for rollator.) Do you have difficulty managing or taking your medications?: Yes (family)  Follow up appointments reviewed: PCP Follow-up appointment confirmed?: No (will call today to make an appointment) MD Provider Line Number:[phone] Given: No Specialist Hospital Follow-up appointment confirmed?: No Reason Specialist Follow-Up Not Confirmed: Patient has Specialist Provider Number and will Call for Appointment Do you need transportation to your follow-up appointment?: No Do you understand care options if your condition(s) worsen?: Yes-patient verbalized understanding  SDOH Interventions Today    Flowsheet Row Most Recent Value  SDOH Interventions   Food Insecurity Interventions Intervention Not Indicated  Housing Interventions Intervention Not Indicated   Transportation Interventions Intervention Not Indicated  Utilities Interventions Intervention Not Indicated    Goals Addressed             This Visit's Progress    [facility] Transitions of Care (TOC) Care Plan       Problems:  Recent Hospitalization for treatment of admission for hypertensive crisis, ischemic stroke Medication management barrier does not have Losartan   No Hospital Follow Up Provider appointment Pending delivery of rollator by [facility]  Pending start of home health Diarrhea - daughter thinks related to Metformin    Goal:  Over the next 30 days, the patient will not experience hospital readmission  Interventions:  Transitions of Care: Doctor Visits  - discussed the importance of doctor visits Post discharge activity limitations prescribed by provider reviewed Reviewed Signs and symptoms of infection Reviewed all discharge instructions with daughter.  Extensive review of medications. Missing Losartan - upon review of medications Losartan  was ordered prior to admission. Daughter thinks medication was not picked up. She will get RX today. Reviewed importance of daily BP checks twice a day and recording. Reviewed heart healthy diet and low sodium Reviewed signs and symptoms of stroke and when to call 911.   Reviewed ETOH abuse history with daughter.  Daughter reports that patient is staying with his 3 daughters and will not have access to alcohol.   Reviewed medications directly related to ETOH abuse like thiamine  and folic acid .  Reviewed importance of timely follow up with PCP. Daughter will call and make an appointment. Reviewed and offered 30 day TOC program and patient/ daughter has consented.  Provided my contact information. Next telephone call scheduled This note sent to PCP Daughter concerned about metformin  causing diarrhea.  Pharmacy referral placed.  Encouraged daughter to call me back for difficulty with getting rollator and home health started.    Heart  Failure Interventions: Basic overview and discussion of pathophysiology of Heart Failure reviewed Provided education on low sodium diet Reviewed Heart Failure Action Plan in depth and provided written copy Assessed need for readable accurate scales in home Provided education about placing scale on hard, flat surface Advised patient to weigh each morning after emptying bladder Discussed importance of daily weight and advised patient to weigh and record daily Reviewed role of diuretics in prevention of fluid overload and management of heart failure; Discussed the importance of keeping all appointments with provider Provided patient with education about the role of exercise in the management of heart failure  Hypertension Interventions: Last practice recorded BP readings:  BP Readings from Last 3 Encounters:  06/09/24 (!) 154/71  06/08/24 (!) 158/86  05/27/24 (!) 164/98    Evaluation of current treatment plan related to hypertension self management and patient's adherence to plan as established by provider Provided education to patient re: stroke prevention,  s/s of heart attack and stroke Reviewed medications with patient and discussed importance of compliance Counseled on adverse effects of illicit drug and excessive alcohol use in patients with high blood pressure  Discussed plans with patient for ongoing care management follow up and provided patient with direct contact information for care management team Advised patient, providing education and rationale, to monitor blood pressure daily and record, calling PCP for findings outside established parameters Reviewed scheduled/upcoming provider appointments including:  Provided education on prescribed diet low sodium heart health diet Discussed complications of poorly controlled blood pressure such as heart disease, stroke, circulatory complications, vision complications, kidney impairment, sexual dysfunction Reviewed and encouraged BP home  monitoring twice a day and taking LOG to MD appointments. Stroke: Reviewed Importance of taking all medications as prescribed Reviewed Importance of attending all scheduled provider appointments Assessed for signs and symptoms of stroke Reviewed referrals to home health Assessed for cognitive impairment Assessed for fall status and safety in the home  Patient Self Care Activities:  Attend all scheduled provider appointments Call pharmacy for medication refills 3-7 days in advance of running out of medications Call provider office for new concerns or questions  Notify RN Care Manager of TOC call rescheduling needs Participate in Transition of Care Program/Attend TOC scheduled calls Take medications as prescribed   Work with the pharmacist to address medication management needs and will continue to work with the clinical team to address health care and disease management related needs call office if I gain more than 2 pounds in one day or 5 pounds in one week do ankle pumps when sitting track weight in diary use salt in moderation watch for swelling in feet, ankles and legs every day weigh myself daily track symptoms and what helps feel better or worse check blood pressure daily write blood pressure results in a log or diary keep a blood pressure log take blood pressure log to all doctor appointments call doctor for signs and symptoms of high blood pressure keep all doctor appointments take medications for blood pressure exactly as prescribed report new symptoms to your doctor Call 911 for any signs of stroke  Call 911 for chest pain or shortness of breath Seek medication attention after any fall.  Plan:  Telephone follow up appointment with care management team member scheduled for:  06/17/2024 The patient has been provided with contact information for the care management team and has been advised to call with any health related questions or concerns.          Patient has  verbally consented for [facility] nurse to speak with all three daughters for the duration of TOC program as of 06/09/2024.  Alan Ee, RN, BSN, CEN [employer]- Transition of Care Team.  [facility] [phone]

## 2024-06-11 ENCOUNTER — Telehealth: Payer: Self-pay

## 2024-06-11 ENCOUNTER — Ambulatory Visit (HOSPITAL_COMMUNITY)

## 2024-06-11 LAB — CULTURE, BLOOD (ROUTINE X 2)
Culture: NO GROWTH
Culture: NO GROWTH

## 2024-06-11 NOTE — Transitions of Care (Post Inpatient/ED Visit) (Signed)
 Transition of Care week 1  Car Coordination  Visit Note  06/11/2024  Name: [name] [name] MRN: [id number]          DOB: Nov 08, 1955  Situation: Patient enrolled in [facility] 30-day program. Visit completed with daughter [name] [name] by telephone.   Background:   Initial Transition Care Management Follow-up Telephone Call Discharge Date and Diagnosis: 06/08/24, Hypertensive emergency without congestive heart failure   Past Medical History:  Diagnosis Date   Anxiety    CAD (coronary artery disease), native coronary artery    a. Nonobstructive CAD by cath 2013 - diffuse distal and branch vessel CAD, no severe disease in the major coronaries, LV mild global hypokinesis, EF 45%. b. ETT-Sestamibi 5/14: EF 31%, small fixed inferior defect with no ischemia.   Chronic CHF (HCC)    a. Mixed ICM/NICM (?EtOH). EF 35% in 2008. Echo 5/13: EF 60-65%, mod LVH, EF 45% on V gram in 12/2011. EF 12/2012: EF 50-55%, mild LVH, inferobasal HK, mild MR. ETT-Ses 5/14 EF 41%. Cardiac MRI 5/14: EF 44%, mild global HK, subepicardial delayed enhancement in nonspecific RV insertion pattern.   COLONIC POLYPS, HX OF 12/30/2006   Gout    H/O atrial flutter 08/13/2005   a. Ablations in 2007, 2008.   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   See care plan:  Interventions: call to [facility]. Daughter  updated.   Goals       Blood Pressure < 140/90      Exercise 3x per week (30 min per time)      HEMOGLOBIN A1C < 7.0      Stay Alive (pt-stated)      VBCI Transitions of Care (TOC) Care Plan      Problems:  Recent Hospitalization for treatment of admission for hypertensive crisis, ischemic stroke Medication management barrier does not have Losartan   No Hospital Follow Up Provider appointment Pending delivery of rollator by [facility],  06/11/2024  Call back from daughter who states they have not received rollator. Pending start of home health  06/11/2024  Call back from daughter who states they have not heard from [facility].  Diarrhea - daughter thinks related to Metformin    Goal:  Over the next 30 days, the patient will not experience hospital readmission  Interventions:  Transitions of Care: Doctor Visits  - discussed the importance of doctor visits Post discharge activity limitations prescribed by provider reviewed Reviewed Signs and symptoms of infection Reviewed all discharge instructions with daughter.  Extensive review of medications. Missing Losartan - upon review of medications Losartan  was ordered prior to admission. Daughter thinks medication was not picked up. She will get RX today. Reviewed importance of daily BP checks twice a day and recording. Reviewed heart healthy diet and low sodium Reviewed signs and symptoms of stroke and when to call  911.   Reviewed ETOH abuse history with daughter.  Daughter reports that patient is staying with his 3 daughters and will not have access to alcohol.   Reviewed medications directly related to ETOH abuse like thiamine  and folic acid .  Reviewed importance of timely follow up with PCP. Daughter will call and make an appointment. Reviewed and offered 30 day TOC program and patient/ daughter has consented.  Provided my contact information. Next telephone call scheduled This note sent to PCP Daughter concerned about metformin  causing  diarrhea.  Pharmacy referral placed.  Encouraged daughter to call me back for difficulty with getting rollator and home health started.  Placed call to [facility] and spoke with [name] [name] who will call daughter and arrange transportation.( 06/11/2024) Placed call to [facility] [name] [name]) to inquire about when services will start. Provided contact information for [facility] to reach out to daughter [name] [name]. Home health PT to start on 06/12/2024  Daughter is aware to answer phone outreach that will  be later today.   Heart Failure Interventions: Basic overview and discussion of pathophysiology of Heart Failure reviewed Provided education on low sodium diet Reviewed Heart Failure Action Plan in depth and provided written copy Assessed need for readable accurate scales in home Provided education about placing scale on hard, flat surface Advised patient to weigh each morning after emptying bladder Discussed importance of daily weight and advised patient to weigh and record daily Reviewed role of diuretics in prevention of fluid overload and management of heart failure; Discussed the importance of keeping all appointments with provider Provided patient with education about the role of exercise in the management of heart failure  Hypertension Interventions: Last practice recorded BP readings:  BP Readings from Last 3 Encounters:  06/09/24 (!) 154/71  06/08/24 (!) 158/86  05/27/24 (!) 164/98    Evaluation of current treatment plan related to hypertension self management and patient's adherence to plan as established by provider Provided education to patient re: stroke prevention, s/s of heart attack and stroke Reviewed medications with patient and discussed importance of compliance Counseled on adverse effects of illicit drug and excessive alcohol use in patients with high blood pressure  Discussed plans with patient for ongoing care management follow up and provided patient with direct contact information for  care management team Advised patient, providing education and rationale, to monitor blood pressure daily and record, calling PCP for findings outside established parameters Reviewed scheduled/upcoming provider appointments including:  Provided education on prescribed diet low sodium heart health diet Discussed complications of poorly controlled blood pressure such as heart disease, stroke, circulatory complications, vision complications, kidney impairment, sexual dysfunction Reviewed and encouraged BP home monitoring twice a day and taking LOG to MD appointments. Stroke: Reviewed Importance of taking all medications as prescribed Reviewed Importance of attending all scheduled provider appointments Assessed for signs and symptoms of stroke Reviewed referrals to home health Assessed for cognitive impairment Assessed for fall status and safety in the home  Patient Self Care Activities:  Attend all scheduled provider appointments Call pharmacy for medication refills 3-7 days in advance of running out of medications Call provider office for new concerns or questions  Notify RN Care Manager of TOC call rescheduling needs Participate in Transition of Care Program/Attend TOC scheduled calls Take medications as prescribed   Work with the pharmacist to address medication management needs and will continue to work with the clinical team to address health care and disease management related needs call office if I gain more than 2 pounds in one  day or 5 pounds in one week do ankle pumps when sitting track weight in diary use salt in moderation watch for swelling in feet, ankles and legs every day weigh myself daily track symptoms and what helps feel better or worse check blood pressure daily write blood pressure results in a log or diary keep a blood pressure log take blood pressure log to all doctor appointments call doctor for signs and symptoms of high blood pressure keep all doctor  appointments take medications for blood pressure exactly as prescribed report new symptoms to your doctor Call 911 for any signs of stroke  Call 911 for chest pain or shortness of breath Seek medication attention after any fall.  Plan:  Telephone follow up appointment with care management team member scheduled for:  06/17/2024 The patient has been provided with contact information for the care management team and has been advised to call with any health related questions or concerns.             Recommendation:   Continue Current Plan of Care  Follow Up Plan:   Telephone follow up appointment date/time:  06/17/2024  Alan Ee, RN, BSN, CEN [facility]- Transition of Care Team.  [facility] [phone]

## 2024-06-12 ENCOUNTER — Telehealth: Payer: Self-pay | Admitting: *Deleted

## 2024-06-12 ENCOUNTER — Telehealth: Payer: Self-pay

## 2024-06-12 DIAGNOSIS — I5030 Unspecified diastolic (congestive) heart failure: Secondary | ICD-10-CM | POA: Insufficient documentation

## 2024-06-12 DIAGNOSIS — G9340 Encephalopathy, unspecified: Secondary | ICD-10-CM

## 2024-06-12 DIAGNOSIS — F1010 Alcohol abuse, uncomplicated: Secondary | ICD-10-CM | POA: Insufficient documentation

## 2024-06-12 DIAGNOSIS — E876 Hypokalemia: Secondary | ICD-10-CM

## 2024-06-12 DIAGNOSIS — R519 Headache, unspecified: Secondary | ICD-10-CM | POA: Insufficient documentation

## 2024-06-12 DIAGNOSIS — I6389 Other cerebral infarction: Secondary | ICD-10-CM | POA: Insufficient documentation

## 2024-06-12 NOTE — Discharge Summary (Signed)
 Physician Discharge Summary   Patient: [name] [name] MRN: [id number] DOB: 13-Apr-1956  Admit date:     06/03/2024  Discharge date: 06/08/2024  Discharge Physician: Duffy Al-Sultani   PCP: Mercer Clotilda SAUNDERS, MD   Recommendations at discharge:    Follow up with PCP - ensure BP controlled, repeat labs to assess electrolytes and creatinine Follow up with cardiology for continued management of Afib and HFpEF Follow up with nephrology for continued monitoring of renal function Follow up with neurology for acute ischemic stroke Follow up with endocrinology for continued management of poorly controlled T2DM  Discharge Diagnoses: Principal Problem:   Hypertensive emergency without congestive heart failure Active Problems:   Primary hypertension   AKI (acute kidney injury)   Paroxysmal A-fib (HCC)   Type 2 diabetes mellitus without complications (HCC)   Acute arterial ischemic stroke, multifocal, multiple vascular territories (HCC)   Right-sided headache   (HFpEF) heart failure with preserved ejection fraction (HCC)   Chronic alcohol abuse  Principal Problem (Resolved):   Hypertensive emergency without congestive heart failure Resolved Problems:   Acute encephalopathy   Hypokalemia  Hospital Course:  68 y.o. male with medical history significant of hypertension, atrial fibrillation on apixaban , hyperlipidemia, poorly controlled type 2 diabetes, CKD (baseline creatinine appears to be 1.4-1.6), and recent admission in 08/2023 for expressive aphasia 2/2 TIA/HTN emergency p/w hypertensive emergency.   The patient reported experiencing shortness of breath and inability to sleep the previous night, stating they were up all night fighting for air. The patient mentioned attempting to alleviate the symptoms by sitting outside on the porch multiple times but was unable to obtain relief. The patient decided to seek medical help in the morning and called their daughter around eight o'clock.  The patient indicated missing several medications but was unsure of which ones or how many days they had missed.   In the ED, the patient was hypertensive to 170-190s and tachypneic to 27 on RA. Labs notable for  K 3.3, BNP 321, and troponin 81 > 75. EDP started IV lasix  20mg  x1 and nitroglycerin  gtt and requested medicine admission.  # Hypertensive emergency - resolved #Primary hypertension - Presented with elevated blood pressures, noted as high as 217/125 in the setting of poor medication compliance. Reports taking his medications 2-3 days a week only. Associated symptoms included chest discomfort, shortness of breath and headache - Home antihypertensives that he had not been compliant with included losartan  25 mg daily, hydralazine  50 mg twice daily, eplerenone  25 mg daily, Imdur  120 mg daily, torsemide  40 mg twice daily. - Was started on nitroglycerin  drip in the ED, ultimately weaned off with PO antihypertensives started - Cardiology consulted and assisted with medication management/consolidation to aid with better outpatient adherence - Discharged on amlodipine  to 10 mg daily, losartan  25 mg daily, Imdur  120 mg daily, eplerenone  25 mg daily, Toprol-XL 25 mg daily, PRN Torsemide  - Follow up with PCP within 1 week of discharge to review labs, reiterated importance of adherence, go over discharge meds - Recommended checking BP twice daily and keeping a log to take to PCP and cardiology visits - Recommended the patient switch to a pillbox organized with the help of his daughters for to resolve any confusion regarding the mail-order packages  # Acute Encephalopathy - resolved # Acute multifocal ischemic strokes likely embolic secondary to PAF not compliant with Eliquis  - Patient's daughter reported several episodes of confusion and word-searching on 10/25 , which is unusual for him and consistent with deficits from  TIA in 08/2023 - MRI brain showed several punctate acute and subacute infarcts  involving both cerebral hemispheres and the cerebellum. Most acute focus in the left centrum semiovale. Acute on chronic synchronous small vessel disease favored, rather than a recent embolic event. No associated hemorrhage or intracranial mass effect. Underlying advanced chronic microvascular ischemic changes. - Neurology consulted - suspected confusion likely multifactorial including delirium and uncontrolled cormorbidities  - MRA showed occlusion of nondominant left vertebral artery at the dural margin. - US  carotid unremarkable - EEG normal - TTE showed LVEF 55-60%, mild LVH - Continue Lipitor  80 mg daily and Eliquis  5 mg BID - Reiterated the significant importance of medication adherence and comorbidity control in order to lower the risk of future CVAs - Referral to outpatient neurology was provided   # AKI - Cr elevated to 1.91 from 1.14 on admission. However, baseline Cr appears to be around 1.40-1.60s most recently - Likely multifactorial secondary to hypertensive emergency on presentation and now Losartan , Torsemide , and Jardiance  use - Nephrology consulted, Dr. Tobie indicated likely secondary to hemodynamic insults in the setting of wide swings in BP, and to allow for some permissive HTN. - Cr 1.66 at the time of discharge, consistent with baseline per nephrology - Fluids discontinued as patient is eating, encouraging oral hydration - Follow-up with PCP and nephrology outpatient for repeat lab work and continued monitoring  # Right-sided headache - resolved - No appreciable neurologic deficits on exam although he reports blurry vision that is not new  - CT head w/o contrast was negative for acute intracranial findings - Improved with better BP control  # HFpEF (LVEF 60-65%) - Was recently seen in the cardiology clinic. Given his medical noncompliance, he reports DOE, 3 pillow orthopnea, frequent PND necessitating him to walk out to his front porch for relief - BNP on admission  321 - CXR showed findings consistent with mild vascular congestion and stable borderline cardiomegaly.  - Received IV Lasix  20 mg once in the ED - Cardiology evaluated and adjusted GDMT medications to promote better patient adherence - For HFpEF, he was discharged on Jardiance  25 mg daily, eplerenone  25 mg daily - Recommended PRN Torsemide  at discharge for > 2 lbs increase in one day, > 5 lbs increase in one week, increased LE swelling, increased shortness of breath.  - Recommended adherence to a low salt < 2g/day diet and fluid restriction to < 2L/day - Recommended keeping a log of daily morning weights  - Follow up in 6-8 weeks with Cardiology, Dr. Rolan  # Hypokalemia - resolved  # Paroxysmal Afib/flutter - Currently rate controled without medications. Per cardiology notes, he is no longer on amiodarone  or sotalol  - Discharged on Toprol-XL 25 mg daily and Eliquis  5 mg BID  # T2DM - Hgb A1c 8.4 (06/04/2024) - Follows with Dr. Dartha with outpatient endocrinology - Outpatient Diabetes medications: Lantus  30 units every day, Humalog  0-10 units TID, Jardiance  25 mg every day, Metformin  1 gram with breakfast, Freestyle Libre 3  - Was managed with Semglee  and SSI while inpatient. Semglee  dose was reduced to 20 units nightly due to some low morning BGs - Discharged on Lantus  20 units nightly, Humalog  0-10 units TID, Jardiance  25 mg daily, Metformin  1,000 mg. - He was instructed to continue monitoring his BG levels with his Freestyle Libre and contact his endocrinologist if his BG levels are running consistently high after the insulin  adjustments - Follow up in endocrinology clinic with Dr. Dartha    #  Chronic alcohol use - Has prior history of alcohol abuse, more recently drinks 1 beer a day with up to 3-4 beers plus 1-2 drinks of liquor while watching football on weekends - CIWA protocol initiated - Thiamine , folate, and multivitamin supplementation - Discussed in detail the need to  significantly reduce and ultimately eliminate his alcohol consumption       Consultants: cardiology, nephrology, neurology Procedures performed: EEG  Disposition: Home Diet recommendation:  Discharge Diet Orders (From admission, onward)     Start     Ordered   06/08/24 0000  Diet - low sodium heart healthy        06/08/24 1518            DISCHARGE MEDICATION: Allergies as of 06/08/2024       Reactions   Shrimp [shellfish Allergy] Nausea And Vomiting   Ace Inhibitors Cough   Other Hives, Other (See Comments)   Patient reports developing hives after receiving some antibiotic given in 1980''s at [facility]. He does not know which antibiotic.        Medication List     STOP taking these medications    Accu-Chek Softclix Lancets lancets   amiodarone  200 MG tablet Commonly known as: PACERONE    carvedilol  12.5 MG tablet Commonly known as: COREG    hydrALAZINE  50 MG tablet Commonly known as: APRESOLINE    insulin  glargine 100 UNIT/ML injection Commonly known as: Lantus  Replaced by: Lantus  SoloStar 100 UNIT/ML Solostar Pen   oxyCODONE -acetaminophen  5-325 MG tablet Commonly known as: PERCOCET/ROXICET   potassium chloride  SA 20 MEQ tablet Commonly known as: KLOR-CON  M   sotalol  120 MG tablet Commonly known as: BETAPACE        TAKE these medications    acetaminophen  500 MG tablet Commonly known as: TYLENOL  Take 1,000 mg by mouth every 6 (six) hours as needed (pain).   albuterol  108 (90 Base) MCG/ACT inhaler Commonly known as: VENTOLIN  HFA Inhale 1-2 puffs into the lungs every 6 (six) hours as needed for wheezing or shortness of breath.   amLODipine  10 MG tablet Commonly known as: NORVASC  Take 1 tablet (10 mg total) by mouth daily.   atorvastatin  80 MG tablet Commonly known as: LIPITOR  TAKE 1 TABLET BY MOUTH ONCE DAILY   Blood Glucose Monitoring Suppl Devi 1 each by Does not apply route in the morning, at noon, and at bedtime. May  substitute to any manufacturer covered by patient's insurance.   BLOOD GLUCOSE TEST STRIPS Strp 1 each by In Vitro route in the morning, at noon, and at bedtime. May substitute to any manufacturer covered by patient's insurance.   CertaVite/Antioxidants Tabs Take 1 tablet by mouth daily.   Eliquis  5 MG Tabs tablet Generic drug: apixaban  TAKE 1 TABLET BY MOUTH TWICE DAILY   eplerenone  25 MG tablet Commonly known as: INSPRA  Take 1 tablet (25 mg total) by mouth daily.   ezetimibe  10 MG tablet Commonly known as: ZETIA  TAKE 1 TABLET BY MOUTH ONCE DAILY   folic acid  1 MG tablet Commonly known as: FOLVITE  Take 1 tablet (1 mg total) by mouth daily.   FreeStyle Libre 3 Reader Devi 1 Device by Does not apply route continuous.   FreeStyle Libre 3 Sensor Misc 1 Device by Does not apply route every 14 (fourteen) days.   insulin  lispro 100 UNIT/ML injection Commonly known as: HUMALOG  Inject 1 Units into the skin 3 (three) times daily with meals. Sliding scale 70-140=0 Units 141-170=1 Units 171-200=2 Units 201-230=3 Units 231-260=4 Units 261-290=5 Units  291-320=6 Units 231-350=7 Units 351-380=8 Units Over 380 10 units   isosorbide  mononitrate 120 MG 24 hr tablet Commonly known as: IMDUR  TAKE 1 TABLET BY MOUTH ONCE DAILY   Jardiance  25 MG Tabs tablet Generic drug: empagliflozin  TAKE 1 TABLET BY MOUTH ONCE DAILY CONTACT OFFICE FOR APPOINTMENT   Lancets Misc. Misc 1 each by Does not apply route in the morning, at noon, and at bedtime. May substitute to any manufacturer covered by patient's insurance.   Lantus  SoloStar 100 UNIT/ML Solostar Pen Generic drug: insulin  glargine Inject 20 Units into the skin at bedtime. Replaces: insulin  glargine 100 UNIT/ML injection   losartan  25 MG tablet Commonly known as: COZAAR  Take 1 tablet (25 mg total) by mouth daily.   metoprolol succinate 25 MG 24 hr tablet Commonly known as: TOPROL-XL Take 1 tablet (25 mg total) by mouth daily.    nitroGLYCERIN  0.4 MG SL tablet Commonly known as: NITROSTAT  DISSOLVE 1 TABLET UNDER THE TONGUE AS NEEDED FOR CHEST PAIN EVERY 5 MINUTES UP TO 3 TIMES. IF NO RELIEF CALL 911.   polyethylene glycol powder 17 GM/SCOOP powder Commonly known as: GLYCOLAX/MIRALAX Dissolve 1 capful (17g) in 4-8 ounces of liquid and take by mouth daily as needed for mild constipation.   TechLite Pen Needles 32G X 4 MM Misc Generic drug: Insulin  Pen Needle Use as directed with Lantus . What changed:  how much to take how to take this when to take this   thiamine  100 MG tablet Commonly known as: VITAMIN B1 Take 1 tablet (100 mg total) by mouth daily.   torsemide  20 MG tablet Commonly known as: DEMADEX  Take 2 tablets (40 mg total) by mouth daily as needed (as needed for weight gain >2 lbs in 24 hrs or > 5 lbs in a week , increased leg swelling, increased shortness of breath. May take another dose if symptoms persist and contact provider). What changed:  when to take this reasons to take this        Follow-up Information     [facility] Follow up.   Specialty: Cardiology Why: Dr. Barbaraann recommended we move out your Advanced Heart Failure Clinic follow-up appointment to 6-8 weeks. Therefore we cancelled your visit 06/11/24 and sent their scheduler a message to request this. The heart failure team will contact you to reschedule this. Contact information: [address] [place]  [phone]        [facility]. Schedule an appointment as soon as possible for a visit in 1 month(s).   Specialty: Neurology Why: stroke clinic Contact information: [address] [place]  [address] [phone]        Dennise Hoes, MD Follow up in 2 week(s).   Specialty: Nephrology Contact information: [address] [place] [address] [phone]         Dartha Ernst, MD.  Schedule an appointment as soon as possible for a visit in 2 week(s).   Specialty: Endocrinology Contact information: [address] [place] [address] [place] [address] [phone]         [facility] Follow up.   Why: Home health has been arranged. They will contact you to schedule apt within 48hrs post discharge. Contact information: [address] [place] [address] [phone]         Mercer Clotilda SAUNDERS, MD. Schedule an appointment as soon as possible for a visit in 1 week(s).  Specialty: Family Medicine Contact information: [address] [place] [address] [phone]                Discharge Exam: Fredricka Weights   06/03/24 0904 06/03/24 1724  Weight: 88.9 kg 85.3 kg   Constitutional:      General: He is not in acute distress.    Appearance: He is not ill-appearing.  Cardiovascular:     Rate and Rhythm: Normal rate. Rhythm irregularly irregular.     Heart sounds: Normal heart sounds. No murmur heard. Pulmonary:     Effort: Pulmonary effort is normal. No respiratory distress.     Breath sounds: Normal breath sounds.  Abdominal:     General: Bowel sounds are normal.     Palpations: Abdomen is soft.     Tenderness: There is no abdominal tenderness.  Musculoskeletal:     Right lower leg: No edema.     Left lower leg: No edema.  Skin:    General: Skin is warm and dry.     Capillary Refill: Capillary refill takes less than 2 seconds.  Neurological:     Mental Status: He is alert, oriented to person, place, and time and easily aroused. Mental status is at baseline.     Cranial Nerves: No cranial nerve deficit.     Sensory: Sensation is intact.     Motor: Motor function is intact.     Coordination: Coordination is intact.   Condition at discharge: good  The results of significant diagnostics from this hospitalization (including imaging, microbiology, ancillary and laboratory) are listed below for reference.    Imaging Studies: ECHOCARDIOGRAM COMPLETE Result Date: 06/08/2024    ECHOCARDIOGRAM REPORT   Patient Name:   Sriman Tally Date of Exam: 06/08/2024 Medical Rec #:  [id number]              Height:       72.0 in Accession #:    [id number]             Weight:       188.1 lb Date of Birth:  11/25/1955              BSA:          2.076 m Patient Age:    68 years               BP:           165/95 mmHg Patient Gender: M                      HR:           59 bpm. Exam Location:  Inpatient Procedure: 2D Echo (Both Spectral and Color Flow Doppler were utilized during            procedure). Indications:    Stroke  History:        Patient has prior history of Echocardiogram examinations.  Sonographer:    Norleen Amour Referring Phys: ARY CUMMINS IMPRESSIONS  1. Left ventricular ejection fraction, by estimation, is 55 to 60%. The left ventricle has normal function. The left ventricle has no regional wall motion abnormalities. There is mild left ventricular hypertrophy. Left ventricular diastolic parameters are indeterminate.  2. Right ventricular systolic function is normal. The right ventricular size is normal. There is moderately elevated pulmonary artery systolic pressure.  3. Left atrial size was moderately dilated.  4. Mild mitral valve regurgitation.  5. The aortic valve is tricuspid.  Aortic valve regurgitation is mild. Aortic valve sclerosis/calcification is present, without any evidence of aortic stenosis.  6. The inferior vena cava is normal in size with <50% respiratory variability, suggesting right atrial pressure of 8 mmHg. Comparison(s): The left ventricular function is unchanged. FINDINGS  Left Ventricle: Left ventricular ejection fraction, by estimation, is 55 to 60%. The left ventricle has normal function. The left ventricle has no regional wall motion abnormalities. The left ventricular internal cavity size was normal in size. There is  mild left ventricular hypertrophy. Left ventricular diastolic  parameters are indeterminate. Right Ventricle: The right ventricular size is normal. Right vetricular wall thickness was not assessed. Right ventricular systolic function is normal. There is moderately elevated pulmonary artery systolic pressure. The tricuspid regurgitant velocity is  2.99 m/s, and with an assumed right atrial pressure of 15 mmHg, the estimated right ventricular systolic pressure is 50.8 mmHg. Left Atrium: Left atrial size was moderately dilated. Right Atrium: Right atrial size was normal in size. Pericardium: There is no evidence of pericardial effusion. Mitral Valve: There is mild thickening of the mitral valve leaflet(s). There is mild calcification of the mitral valve leaflet(s). Mild to moderate mitral annular calcification. Mild mitral valve regurgitation. Tricuspid Valve: The tricuspid valve is normal in structure. Tricuspid valve regurgitation is mild. Aortic Valve: The aortic valve is tricuspid. Aortic valve regurgitation is mild. Aortic valve sclerosis/calcification is present, without any evidence of aortic stenosis. Aortic valve mean gradient measures 3.0 mmHg. Aortic valve peak gradient measures 4.9 mmHg. Aortic valve area, by VTI measures 3.13 cm. Pulmonic Valve: The pulmonic valve was normal in structure. Pulmonic valve regurgitation is not visualized. Aorta: The aortic root and ascending aorta are structurally normal, with no evidence of dilitation. Venous: The inferior vena cava is normal in size with less than 50% respiratory variability, suggesting right atrial pressure of 8 mmHg. IAS/Shunts: No atrial level shunt detected by color flow Doppler.  LEFT VENTRICLE PLAX 2D LVIDd:         4.80 cm      Diastology LVIDs:         3.00 cm      LV e' medial:    6.09 cm/s LV PW:         1.30 cm      LV E/e' medial:  17.2 LV IVS:        1.10 cm      LV e' lateral:   9.36 cm/s LVOT diam:     2.40 cm      LV E/e' lateral: 11.2 LV SV:         69 LV SV Index:   33 LVOT Area:     4.52 cm LV  IVRT:       85 msec  LV Volumes (MOD) LV vol d, MOD A2C: 106.0 ml LV vol d, MOD A4C: 121.0 ml LV vol s, MOD A2C: 44.3 ml LV vol s, MOD A4C: 52.2 ml LV SV MOD A2C:     61.7 ml LV SV MOD A4C:     121.0 ml LV SV MOD BP:      66.4 ml RIGHT VENTRICLE             IVC RV Basal diam:  3.20 cm     IVC diam: 2.10 cm RV S prime:     12.00 cm/s TAPSE (M-mode): 1.6 cm      PULMONARY VEINS  Diastolic Velocity: 45.30 cm/s                             S/D Velocity:       0.50                             Systolic Velocity:  24.60 cm/s LEFT ATRIUM           Index        RIGHT ATRIUM           Index LA diam:      4.40 cm 2.12 cm/m   RA Area:     19.00 cm LA Vol (A2C): 54.6 ml 26.31 ml/m  RA Volume:   58.50 ml  28.19 ml/m LA Vol (A4C): 91.2 ml 43.94 ml/m  AORTIC VALVE                    PULMONIC VALVE AV Area (Vmax):    3.12 cm     PV Vmax:       0.82 m/s AV Area (Vmean):   3.03 cm     PV Peak grad:  2.7 mmHg AV Area (VTI):     3.13 cm AV Vmax:           111.00 cm/s AV Vmean:          78.700 cm/s AV VTI:            0.220 m AV Peak Grad:      4.9 mmHg AV Mean Grad:      3.0 mmHg LVOT Vmax:         76.60 cm/s LVOT Vmean:        52.700 cm/s LVOT VTI:          0.152 m LVOT/AV VTI ratio: 0.69  AORTA Ao Root diam: 3.30 cm Ao Asc diam:  3.10 cm MITRAL VALVE                TRICUSPID VALVE MV Area (PHT): 4.63 cm     TR Peak grad:   35.8 mmHg MV Decel Time: 164 msec     TR Vmax:        299.00 cm/s MV E velocity: 105.00 cm/s MV A velocity: 51.90 cm/s   SHUNTS MV E/A ratio:  2.02         Systemic VTI:  0.15 m                             Systemic Diam: 2.40 cm Vina Gull MD Electronically signed by Vina Gull MD Signature Date/Time: 06/08/2024/12:29:03 PM    Final    VAS US  CAROTID Result Date: 06/08/2024 Carotid Arterial Duplex Study Patient Name:  EHREN BERISHA [facility]  Date of Exam:   06/07/2024 Medical Rec #: [id number]               Accession #:    [id number] Date of Birth: March 10, 1956               Patient  Gender: M Patient Age:   15 years Exam Location:  [facility] Procedure:      VAS US  CAROTID Referring Phys: [facility] --------------------------------------------------------------------------------  Indications:       CVA. Risk Factors:      Hypertension, Diabetes. Comparison Study:  No prior studies. Performing Technologist: Cordella Collet  RVT  Examination Guidelines: A complete evaluation includes B-mode imaging, spectral Doppler, color Doppler, and power Doppler as needed of all accessible portions of each vessel. Bilateral testing is considered an integral part of a complete examination. Limited examinations for reoccurring indications may be performed as noted.  Right Carotid Findings: +----------+--------+--------+--------+--------------------------+--------+           PSV cm/sEDV cm/sStenosisPlaque Description        Comments +----------+--------+--------+--------+--------------------------+--------+ CCA Prox  63      7               smooth and heterogenous            +----------+--------+--------+--------+--------------------------+--------+ CCA Distal71      7               irregular and heterogenous         +----------+--------+--------+--------+--------------------------+--------+ ICA Prox  53      6               smooth and heterogenous            +----------+--------+--------+--------+--------------------------+--------+ ICA Mid   40      9                                         tortuous +----------+--------+--------+--------+--------------------------+--------+ ICA Distal38      10                                        tortuous +----------+--------+--------+--------+--------------------------+--------+ ECA       167     1                                                  +----------+--------+--------+--------+--------------------------+--------+ +----------+--------+-------+--------+-------------------+           PSV cm/sEDV  cmsDescribeArm Pressure (mmHG) +----------+--------+-------+--------+-------------------+ [id number]                                        +----------+--------+-------+--------+-------------------+ +---------+--------+--+--------+-+---------+ VertebralPSV cm/s44EDV cm/s6Antegrade +---------+--------+--+--------+-+---------+  Left Carotid Findings: +----------+--------+-------+--------+--------------------------------+--------+           PSV cm/sEDV    StenosisPlaque Description              Comments                   cm/s                                                    +----------+--------+-------+--------+--------------------------------+--------+ CCA Prox  80      13             smooth and heterogenous                  +----------+--------+-------+--------+--------------------------------+--------+ CCA Distal122     13             smooth and heterogenous                  +----------+--------+-------+--------+--------------------------------+--------+  ICA Prox  91      12             smooth, heterogenous and                                                  calcific                                 +----------+--------+-------+--------+--------------------------------+--------+ ICA Mid   56      14                                             tortuous +----------+--------+-------+--------+--------------------------------+--------+ ICA Distal44      10                                             tortuous +----------+--------+-------+--------+--------------------------------+--------+ ECA       139                                                             +----------+--------+-------+--------+--------------------------------+--------+ +----------+--------+--------+--------+-------------------+           PSV cm/sEDV cm/sDescribeArm Pressure (mmHG) +----------+--------+--------+--------+-------------------+ Subclavian101                                          +----------+--------+--------+--------+-------------------+ +---------+--------+--+--------+-+---------+ VertebralPSV cm/s64EDV cm/s6Antegrade +---------+--------+--+--------+-+---------+   Summary: Right Carotid: Velocities in the right ICA are consistent with a 1-39% stenosis. Left Carotid: Velocities in the left ICA are consistent with a 1-39% stenosis. Vertebrals: Bilateral vertebral arteries demonstrate antegrade flow. *See table(s) above for measurements and observations.  Electronically signed by Eather Popp MD on 06/08/2024 at 8:03:15 AM.    Final    EEG adult Result Date: 06/07/2024 Matthews Elida HERO, MD     06/07/2024  7:47 PM Routine EEG Report Benjerman Molinelli is a 68 y.o. male with a history of altered mental status who is undergoing an EEG to evaluate for seizures. Report: This EEG was acquired with electrodes placed according to the International 10-20 electrode system (including Fp1, Fp2, F3, F4, C3, C4, P3, P4, O1, O2, T3, T4, T5, T6, A1, A2, Fz, Cz, Pz). The following electrodes were missing or displaced: none. The occipital dominant rhythm was 8.5 Hz. This activity is reactive to stimulation. Drowsiness was manifested by background fragmentation; deeper stages of sleep were identified by K complexes and sleep spindles. There was no focal slowing. There were no interictal epileptiform discharges. There were no electrographic seizures identified. There was no abnormal response to photic stimulation; hyperventilation were not performed. Impression: This EEG was obtained while awake and asleep and is normal.   Clinical Correlation: Normal EEGs, however, do not rule out epilepsy. Elida Matthews, MD [facility] [phone] If 7pm- 7am, please page neurology on call  as listed in AMION.   MR ANGIO HEAD WO CONTRAST Result Date: 06/07/2024 EXAM: MR Angiography Head without intravenous Contrast. 06/07/2024 11:36:00 AM TECHNIQUE: Magnetic  resonance angiography images of the head without intravenous contrast. Multiplanar 2D and 3D reformatted images are provided for review. COMPARISON: None provided. CLINICAL HISTORY: Neuro deficit, acute, stroke suspected. FINDINGS: ANTERIOR CIRCULATION: No significant stenosis of the internal carotid arteries. No significant stenosis of the anterior cerebral arteries. No significant stenosis of the middle cerebral arteries. No aneurysm. POSTERIOR CIRCULATION: The nondominant Left Vertebral Artery is occluded at the dural margin. The Right Vertebral Artery and Basilar Artery are normal. Posterior Cerebral Arteries are unremarkable. No aneurysm. IMPRESSION: 1. 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 The basilar artery and posterior cerebral arteries appear normal. Electronically signed by: Lonni Necessary MD 06/07/2024 12:18 PM EDT RP Workstation: [id number]   CT CHEST ABDOMEN PELVIS WO CONTRAST Result Date: 06/06/2024 EXAM: CT CHEST, ABDOMEN AND PELVIS WITHOUT CONTRAST 06/06/2024 03:50:52 PM TECHNIQUE: CT of the chest, abdomen and pelvis was performed without the administration of intravenous contrast. Multiplanar reformatted images are provided for review. Automated exposure control, iterative reconstruction, and/or weight based adjustment of the mA/kV was utilized to reduce the radiation dose to as low as reasonably achievable. COMPARISON: CTA chest dated 01/22/2022 and CT abdomen/pelvis dated 01/13/2016. CLINICAL HISTORY: acute encephalopathy, abdominal pain. Chief complaints; Chest Pain; CT CHEST ABDOMEN PELVIS WO CONTRAST; acute encephalopathy, abdominal pain FINDINGS: CHEST: MEDIASTINUM AND LYMPH NODES: Cardiomegaly. Severe 3 vessel coronary atherosclerosis. The central airways are clear. Mild mediastinal lymphadenopathy, including a dominant 14 mm right lower paratracheal node, progressive but possibly reactive. No hilar or axillary lymphadenopathy. LUNGS AND PLEURA:  Interlobular septal thickening lower lobe/infrahilar predominant, favoring mild interstitial edema. Small right and trace left pleural effusions. Left lower lobe calcification/granuloma (image 45), new from prior, benign. No pneumothorax. ABDOMEN AND PELVIS: LIVER: The liver is unremarkable. GALLBLADDER AND BILE DUCTS: Status post cholecystectomy. No biliary ductal dilatation. SPLEEN: No acute abnormality. PANCREAS: No acute abnormality. ADRENAL GLANDS: No acute abnormality. KIDNEYS, URETERS AND BLADDER: 3.5 cm left upper pole renal cyst, benign. No follow-up is recommended. Per consensus, no follow-up is needed for simple Bosniak type 1 and 2 renal cysts, unless the patient has a malignancy history or risk factors. No stones in the kidneys or ureters. No hydronephrosis. No perinephric or periureteral stranding. Urinary bladder is unremarkable. GI AND BOWEL: Stomach demonstrates no acute abnormality. Normal appendix (image 85). There is no bowel obstruction. REPRODUCTIVE ORGANS: Mild prostatomegaly. PERITONEUM AND RETROPERITONEUM: No ascites. No free air. VASCULATURE: Aorta is normal in caliber. Thoracic aortic atherosclerosis. Abdominal aortic atherosclerosis. ABDOMINAL AND PELVIS LYMPH NODES: No lymphadenopathy. BONES AND SOFT TISSUES: Grade 1 spondylolisthesis at L5-S1. Degenerative changes of the visualized thoracolumbar spine. Small fat-containing right inguinal hernia. No focal soft tissue abnormality. IMPRESSION: 1. Cardiomegaly with mild interstitial edema and small bilateral pleural effusions. 2. Mild mediastinal lymphadenopathy, possibly reactive. Consider follow-up CT chest in 3 months. 3. No acute findings in the abdomen/pelvis. Electronically signed by: Pinkie Pebbles MD 06/06/2024 09:30 PM EDT RP Workstation: [id number]   US  RENAL Result Date: 06/06/2024 EXAM: US  Retroperitoneum Complete, Renal. CLINICAL HISTORY: 409830 AKI (acute kidney injury) 409830. AKI (acute kidney injury) TECHNIQUE:  Real-time ultrasound of the retroperitoneum (complete) with image documentation. COMPARISON: None provided. FINDINGS: RIGHT KIDNEY: Right kidney measures 10.4 x 4.4 x 5.5 cm. Total volume equals 131.5 ml. No hydronephrosis, renal stone, or mass visualized. LEFT KIDNEY: Left kidney measures 11.0 x 6.2  x 5.8 cm. Total volume equals 204.4 ml. An anechoic cyst in the upper pole measures 3.5 cm. No hydronephrosis or renal stone. BLADDER: Unremarkable as visualized. IMPRESSION: 1. No hydronephrosis or mass in the right kidney. 2. No hydronephrosis in the left kidney. Electronically signed by: Norleen Boxer MD 06/06/2024 05:44 PM EDT RP Workstation: [id number]   MR BRAIN WO CONTRAST Result Date: 06/06/2024 EXAM: MRI BRAIN WITHOUT CONTRAST 06/05/2024 08:19:32 PM TECHNIQUE: Multiplanar multisequence MRI of the head/brain was performed without the administration of intravenous contrast. COMPARISON: Brain MRI 08/22/2023. Head CT 06/04/2024. CLINICAL HISTORY: 68 year old male with hypertensive emergency and acute neuro deficit, stroke suspected. FINDINGS: BRAIN AND VENTRICLES: Punctate restricted diffusion is present in the posterior left frontal lobe white matter (central semiovale, series 2 image 39), right hemisphere periventricular white matter (series 2 image 32), superior right perirolandic cortex (image 42), and left superior cerebellum (series 2 image 16). There is background widespread patchy and confluent cerebral white matter chronic T2 and FLAIR hyperintensity. T2 shine through is noted elsewhere, including in the pons. Moderate chronic T2 and FLAIR heterogeneity is also present throughout the bilateral deep gray nuclei. Occasional punctate chronic infarcts are seen in the cerebellum. No cortical encephalomalacia. No definite chronic cerebral blood products on SWI. No intracranial hemorrhage. No mass. No midline shift. No hydrocephalus. The sella is unremarkable. Chronically abnormal distal left vertebral artery  flow void is stable (series 5 image 5). Other major vascular flow voids are preserved. Stable cerebral volume. Acute on chronic synchronous small vessel disease is favored rather than a recent embolic event. ORBITS: No acute abnormality. SINUSES AND MASTOIDS: No acute abnormality. BONES AND SOFT TISSUES: Normal marrow signal. No acute soft tissue abnormality. Negative for age visible cervical spine. IMPRESSION: 1. Several punctate acute and subacute infarcts involving both cerebral hemispheres and the cerebellum. Most acute focus in the left centrum semiovale. Acute on chronic synchronous small vessel disease favored, rather than a recent embolic event. 2. No associated hemorrhage or intracranial mass effect. 3. Underlying advanced chronic microvascular ischemic changes. Electronically signed by: Helayne Hurst MD 06/06/2024 04:02 AM EDT RP Workstation: [id number]   CT HEAD WO CONTRAST ( ) Result Date: 06/04/2024 EXAM: CT HEAD WITHOUT CONTRAST 06/04/2024 11:45:00 AM TECHNIQUE: CT of the head was performed without the administration of intravenous contrast. Automated exposure control, iterative reconstruction, and/or weight based adjustment of the mA/kV was utilized to reduce the radiation dose to as low as reasonably achievable. COMPARISON: Head CT 11/24/2023 and MRI 08/22/2023. CLINICAL HISTORY: Headache, sudden, severe; Hypertensive emergency. FINDINGS: BRAIN AND VENTRICLES: There is no evidence of an acute infarct, intracranial hemorrhage, mass, midline shift, hydrocephalus, or extra-axial fluid collection. Patchy cerebral white matter hypodensities are similar to the prior CT and nonspecific but compatible with moderate chronic small vessel ischemic disease. There is mild chronic heterogeneity in the deep gray nuclei. There is mild cerebral atrophy. A small chronic left cerebellar infarct is unchanged. Calcified atherosclerosis at the skull base. ORBITS: No acute abnormality. SINUSES: Mucosal thickening in  the paranasal sinuses. Clear mastoid air cells. SOFT TISSUES AND SKULL: No acute soft tissue abnormality. No skull fracture. IMPRESSION: 1. No acute intracranial abnormality. 2. Moderate chronic small vessel ischemic disease. Electronically signed by: Dasie Hamburg MD 06/04/2024 11:58 AM EDT RP Workstation: [id number]   DG Chest Port 1 View Result Date: 06/03/2024 CLINICAL DATA:  Left-sided chest pain and shortness of breath since last night. EXAM: PORTABLE CHEST 1 VIEW COMPARISON:  01/02/2024 FINDINGS: Lungs are adequately inflated demonstrate bilateral minimal hazy prominence  of the central pulmonary vessels suggesting mild vascular congestion. No lobar consolidation or significant effusion. Stable borderline cardiomegaly. Remainder of the exam is unchanged. IMPRESSION: Borderline cardiomegaly with suggestion of mild vascular congestion. Electronically Signed   By: Toribio Agreste M.D.   On: 06/03/2024 10:16    Microbiology: Results for orders placed or performed during the hospital encounter of 06/03/24  MRSA Next Gen by PCR, Nasal     Status: None   Collection Time: 06/03/24  5:42 PM   Specimen: Nasal Mucosa; Nasal Swab  Result Value Ref Range Status   MRSA by PCR Next Gen NOT DETECTED NOT DETECTED Final    Comment: (NOTE) The GeneXpert MRSA Assay (FDA approved for NASAL specimens only), is one component of a comprehensive MRSA colonization surveillance program. It is not intended to diagnose MRSA infection nor to guide or monitor treatment for MRSA infections. Test performance is not FDA approved in patients less than 70 years old. Performed at [facility], [address] [place], [place] [address]   Culture, blood (Routine X 2) w Reflex to ID Panel     Status: None   Collection Time: 06/06/24  3:33 PM   Specimen: BLOOD LEFT ARM  Result Value Ref Range Status   Specimen Description BLOOD LEFT ARM  Final   Special Requests   Final    BOTTLES DRAWN AEROBIC AND ANAEROBIC Blood  Culture results may not be optimal due to an inadequate volume of blood received in culture bottles   Culture   Final    NO GROWTH 5 DAYS Performed at [facility], [address], [place], [place] [address]    Report Status 06/11/2024 FINAL  Final  Culture, blood (Routine X 2) w Reflex to ID Panel     Status: None   Collection Time: 06/06/24  3:33 PM   Specimen: BLOOD LEFT ARM  Result Value Ref Range Status   Specimen Description BLOOD LEFT ARM  Final   Special Requests   Final    BOTTLES DRAWN AEROBIC AND ANAEROBIC Blood Culture results may not be optimal due to an inadequate volume of blood received in culture bottles   Culture   Final    NO GROWTH 5 DAYS Performed at [facility], [address], [place], [place] [address]    Report Status 06/11/2024 FINAL  Final   *Note: Due to a large number of results and/or encounters for the requested time period, some results have not been displayed. A complete set of results can be found in Results Review.    Labs: CBC: Recent Labs  Lab 06/05/24 1014 06/06/24 0231 06/06/24 1533 06/07/24 0418 06/08/24 0339  WBC 7.1 8.9 8.2 6.4 6.5  NEUTROABS  --   --  4.8  --   --   HGB 14.1 14.0 14.5 14.0 14.4  HCT 42.2 43.2 44.3 42.7 44.4  MCV 87.7 88.5 89.1 89.0 90.1  PLT 163 174 174 180 174   Basic Metabolic Panel: Recent Labs  Lab 06/05/24 1014 06/06/24 0231 06/06/24 1533 06/07/24 0418 06/08/24 0339  NA 138 139 136 137 137  K 3.3* 3.1* 3.7 3.7 4.1  CL 102 101 103 105 103  CO2 26 27 22  21* 25  GLUCOSE 159* 70 94 110* 156*  BUN 18 19 17 16 14   CREATININE 1.74* 1.91* 1.46* 1.45* 1.66*  CALCIUM  8.9 9.0 9.0 8.9 8.8*  MG 1.7  --  2.2  --   --   PHOS  4.1  --  3.6  --   --    Liver Function Tests: Recent Labs  Lab 06/05/24 1014 06/06/24 0231 06/06/24 1533 06/07/24 0418 06/08/24 0339  AST 14* 12* 30 19 19   ALT 12 10 22 17 17   ALKPHOS 48 52 71 61 64  BILITOT 1.2 0.8 1.1 1.0 0.8  PROT 6.0* 6.1* 6.4* 5.8* 6.4*   ALBUMIN 2.6* 2.7* 2.8* 2.5* 2.8*   CBG: Recent Labs  Lab 06/07/24 1627 06/07/24 2131 06/08/24 0614 06/08/24 1137 06/08/24 1535  GLUCAP 157* 164* 171* 147* 240*   Time Coordinating Discharge: I spent a total of 45 minutes engaged in face-to-face discussion with the patient and/or caregivers regarding the patient's care, assessment, plan, and discharge disposition. Over 50% of this time was dedicated to counseling the patient on the risks and benefits of treatment options and the discharge plan, as well as coordinating post-discharge care.   Signed: Britini Garcilazo Al-Sultani, MD [facility] 06/12/2024

## 2024-06-12 NOTE — Progress Notes (Signed)
 Care Guide Pharmacy Note  06/12/2024 Name: [name] [name] MRN: [id number] DOB: Oct 09, 1955  Referred By: Mercer Clotilda SAUNDERS, MD Reason for referral: Complex Care Management (Outreach to schedule referral with pharmacist )   [name] [name] is a 68 y.o. year old male who is a primary care patient of Mercer Clotilda SAUNDERS, MD.  [name] was referred to the pharmacist for assistance related to: DMII  Successful contact was made with the patient to discuss pharmacy services including being ready for the pharmacist to call at least 5 minutes before the scheduled appointment time and to have medication bottles and any blood pressure readings ready for review. The patient agreed to meet with the pharmacist via telephone visit on 07/06/2024  [name], CMA [place]  [facility], [facility] Guide Direct Dial : [phone]  Fax: [phone] Website: [email or web address]

## 2024-06-12 NOTE — Telephone Encounter (Signed)
 Copied from CRM [id number]. Topic: Clinical - Home Health Verbal Orders >> Jun 12, 2024 12:31 PM Revonda D wrote: Caller/Agency: Almira with [facility]  Callback Number: [phone],dzrlmz line Service Requested: Physical Therapy Frequency: twice for 2 weeks, once for 2 weeks Any new concerns about the patient? No

## 2024-06-12 NOTE — Chronic Care Management (AMB) (Signed)
 A user error has taken place: encounter opened in error, closed for administrative reasons.

## 2024-06-15 ENCOUNTER — Other Ambulatory Visit (HOSPITAL_COMMUNITY): Payer: Self-pay | Admitting: Internal Medicine

## 2024-06-15 ENCOUNTER — Other Ambulatory Visit: Payer: Self-pay

## 2024-06-15 DIAGNOSIS — I2510 Atherosclerotic heart disease of native coronary artery without angina pectoris: Secondary | ICD-10-CM

## 2024-06-15 NOTE — Patient Instructions (Signed)
 Visit Information  Thank you for taking time to visit with me today. Please don't hesitate to contact me if I can be of assistance to you before our next scheduled telephone appointment.  Following are the goals we discussed today:   Goals       Blood Pressure < 140/90      Exercise 3x per week (30 min per time)      HEMOGLOBIN A1C < 7.0      Stay Alive (pt-stated)      [facility] Transitions of Care (TOC) Care Plan      Problems:  Recent Hospitalization for treatment of admission for hypertensive crisis, ischemic stroke  06/15/2024 daughter reports BP 160/90.    Weight is stable.  No swelling,  Daughter reports getting up and down difficulty.  Vision problems and neuropathy.  Medication management barrier does not have Losartan .   06/15/2024  Patient is taking losartan .   No Hospital Follow Up Provider appointment.   06/15/2024  Has an appointment with Dr. Carlie.  Pending delivery of rollator by [employer],  06/11/2024  Call back from daughter who states they have not received rollator.  06/15/2024 daughter reports that they still have not gotten rollator.  Pending start of home health  06/11/2024  Call back from daughter who states they have not heard from [facility]. 06/15/2024  daughter confirms patient has started his PT Diarrhea - daughter thinks related to Metformin   06/15/2024  Daughter reports diarrhea has stopped. BP elevated:  06/15/2024  Reviewed home BP readings and BP is up and ranging 160/90   Goal:  Over the next 30 days, the patient will not experience hospital readmission  Interventions:  Transitions of Care: Doctor Visits  - discussed the importance of doctor visits Confirmed that patient is active with PT. Reviewed that patient has not gotten his rollator. Placed call to [employer]/ [employer] and was informed it would be delivered today. I was informed that order was place but Rollator never delivered.  Reviewed recent CBG reading and recently lows despite patient eating well.  Reviewed importance of protein filled bedtime snack. Daughter is comfortable with treatment for hypoglycemia.  Confirmed that patient has his medications and is taking them as prescribed Reviewed importance of daily BP checks twice a day and recording. Reviewed heart healthy diet and low sodium Reviewed signs and symptoms of stroke and when to call 911. Provided my contact information. Next telephone call scheduled Encouraged daughter to send MY Chart message to cardiology about elevated BP  Provided delivery address to [employer] for rollator to be delivered to daughters home.  This note sent to PCP with concerns about BP. Patient has PCP appointment in 2 days.    Heart Failure Interventions: Discussed the importance of keeping all appointments with provider Reviewed weights. No weigh gain since hospital discharge  Hypertension Interventions: Last practice recorded BP readings:  BP Readings from Last 3 Encounters:  06/09/24 (!) 154/71  06/08/24 (!) 158/86  05/27/24 (!) 164/98    Evaluation of current treatment plan related to hypertension self management and patient's adherence to plan as established by provider Provided education to patient re: stroke prevention, s/s of heart attack and stroke Reviewed medications with patient and discussed importance of compliance Discussed plans with patient for ongoing care management follow up and provided patient with direct contact information for care management team Advised patient, providing education and rationale, to monitor blood pressure daily and record, calling PCP for findings outside established parameters.  Reviewed this again and  discussed with daughter recent BP readings are too high. Confirmed patient is taking his medications as prescribed.  Reviewed scheduled/upcoming provider appointments including: PCP Provided education on prescribed diet low sodium heart health diet Reviewed and encouraged BP home monitoring twice a day and  taking LOG to MD appointments. Stroke: Reviewed Importance of taking all medications as prescribed Reviewed Importance of attending all scheduled provider appointments Assessed for signs and symptoms of stroke Reviewed referrals to home health Assessed for cognitive impairment Assessed for fall status and safety in the home  Patient Self Care Activities:  Attend all scheduled provider appointments Call pharmacy for medication refills 3-7 days in advance of running out of medications Call provider office for new concerns or questions  Notify RN Care Manager of TOC call rescheduling needs Participate in Transition of Care Program/Attend TOC scheduled calls Take medications as prescribed   Work with the pharmacist to address medication management needs and will continue to work with the clinical team to address health care and disease management related needs call office if I gain more than 2 pounds in one day or 5 pounds in one week do ankle pumps when sitting track weight in diary use salt in moderation watch for swelling in feet, ankles and legs every day weigh myself daily track symptoms and what helps feel better or worse check blood pressure daily write blood pressure results in a log or diary keep a blood pressure log take blood pressure log to all doctor appointments call doctor for signs and symptoms of high blood pressure keep all doctor appointments take medications for blood pressure exactly as prescribed report new symptoms to your doctor Call 911 for any signs of stroke  Call 911 for chest pain or shortness of breath Seek medication attention after any fall. Daughter to send My Chart message to cardiology  Plan:  Telephone follow up appointment with care management team member scheduled for:  06/22/2024 The patient has been provided with contact information for the care management team and has been advised to call with any health related questions or concerns.           Our next appointment is by telephone on 06/22/2024 at 1130  Please call the care guide team at [phone] if you need to cancel or reschedule your appointment.   If you are experiencing a Mental Health or Behavioral Health Crisis or need someone to talk to, please call the Suicide and Crisis Lifeline: 988 call the [place]  National Suicide Prevention Lifeline: 3610683022 or TTY: 778-122-1386 TTY 778 661 7862) to talk to a trained counselor call 1-800-273-TALK (toll free, 24 hour hotline) call 911   Care plan and visit instructions communicated with the patient verbally today. Patient agrees to receive a copy in MyChart. Active MyChart status and patient understanding of how to access instructions and care plan via MyChart confirmed with patient.     Alan Ee, RN, BSN, CEN [employer]- Transition of Care Team.  [facility] [phone]

## 2024-06-15 NOTE — Transitions of Care (Post Inpatient/ED Visit) (Signed)
 Transition of Care week 2  Visit Note  06/15/2024  Name: [name] [name] MRN: [id number]          DOB: 10/12/1955  Situation: Patient enrolled in [facility] 30-day program. Visit completed with daughter [name] by telephone.   Background:   Initial Transition Care Management Follow-up Telephone Call Discharge Date and Diagnosis: 06/08/24, Hypertensive emergency without congestive heart failure   Past Medical History:  Diagnosis Date   Anxiety    CAD (coronary artery disease), native coronary artery    a. Nonobstructive CAD by cath 2013 - diffuse distal and branch vessel CAD, no severe disease in the major coronaries, LV mild global hypokinesis, EF 45%. b. ETT-Sestamibi 5/14: EF 31%, small fixed inferior defect with no ischemia.   Chronic CHF (HCC)    a. Mixed ICM/NICM (?EtOH). EF 35% in 2008. Echo 5/13: EF 60-65%, mod LVH, EF 45% on V gram in 12/2011. EF 12/2012: EF 50-55%, mild LVH, inferobasal HK, mild MR. ETT-Ses 5/14 EF 41%. Cardiac MRI 5/14: EF 44%, mild global HK, subepicardial delayed enhancement in nonspecific RV insertion pattern.   COLONIC POLYPS, HX OF 12/30/2006   Gout    H/O atrial flutter 08/13/2005   a. Ablations in 2007, 2008.   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 Reports more clear minded than in the past. Reports recent hypoglycemic episodes. Daughter is treating hypoglycemia.  Daughter states BP  has been high. Denies any headache or neurologic complication. Reports patient is taking all medications as prescribed. Continues to live with daughters. No ETOH since hospital discharge.  Patient Reported Symptoms: Cognitive Cognitive Status: Other: (phone call with daughter only)      Neurological Neurological Review of Symptoms: Weakness Neurological Management Strategies: Medication therapy Neurological Self-Management Outcome: 4 (good)  HEENT HEENT Symptoms Reported: Not assessed      Cardiovascular Cardiovascular Symptoms Reported: Other: Other Cardiovascular Symptoms: Daughter reports that BP is running high   160-90 Does patient have uncontrolled Hypertension?: Yes Is patient checking Blood Pressure at home?: Yes Cardiovascular Management Strategies: Medication therapy Weight: 187 lb 9.6 oz (85.1 kg) Cardiovascular Self-Management Outcome: 4 (good)  Respiratory Respiratory Symptoms Reported: No symptoms reported    Endocrine Endocrine Symptoms Reported: Hypoglycemia Is patient diabetic?: Yes Is patient checking blood sugars at home?: Yes List most recent blood sugar readings, include date and time of day: During the CBG 60-70,  treated with candy and glucerna and  came up to 80.. Currently 115 Endocrine Self-Management Outcome: 4 (good)  Gastrointestinal Gastrointestinal Symptoms Reported: No symptoms reported Additional Gastrointestinal Details: denies recent diarrhea Gastrointestinal Self-Management Outcome: 5 (very good)    Genitourinary Genitourinary Symptoms Reported: Incontinence Genitourinary Management Strategies: Incontinence garment/pad Genitourinary Self-Management Outcome: 4 (good)  Integumentary Integumentary Symptoms Reported: No symptoms reported Skin Management Strategies: Routine screening  Musculoskeletal Musculoskelatal Symptoms Reviewed: Unsteady gait Additional Musculoskeletal Details: feet tingling and  numb. Musculoskeletal Management Strategies:  Routine screening Musculoskeletal Comment: tylenol  as needed for pain. no falls. Active with PT      Psychosocial Psychosocial Symptoms Reported: Not assessed         Today's Vitals   06/15/24 1154 06/15/24 1155  BP:  (!) 163/90  Pulse:  63  Weight: 187 lb 9.6 oz (85.1 kg) 187 lb 9.6 oz (85.1 kg)  PainSc:  0-No pain     Medications Reviewed Today     Reviewed by Rumalda Alan PENNER, RN (Registered Nurse) on 06/15/24 at 1302  Med List Status: <None>   Medication Order Taking? Sig Documenting Provider Last Dose Status Informant  acetaminophen  (TYLENOL ) 500 MG tablet [id number] Yes Take 1,000 mg by mouth every 6 (six) hours as needed (pain). [provider]  Active Self  albuterol  (VENTOLIN  HFA) 108 (90 Base) MCG/ACT inhaler [id number] Yes Inhale 1-2 puffs into the lungs every 6 (six) hours as needed for wheezing or shortness of breath. Babara Greig GAILS, PA-C  Active Self  amLODipine  (NORVASC ) 10 MG tablet [id number] Yes Take 1 tablet (10 mg total) by mouth daily. Al-Sultani, Anmar, MD  Active   atorvastatin  (LIPITOR ) 80 MG tablet [id number] Yes TAKE 1 TABLET BY MOUTH ONCE DAILY Bensimhon, Toribio SAUNDERS, MD  Active Self  Blood Glucose Monitoring Suppl DEVI [id number] Yes 1 each by Does not apply route in the morning, at noon, and at bedtime. May substitute to any manufacturer covered by patient's insurance. Mercer Clotilda SAUNDERS, MD  Active Self  Continuous Glucose Receiver (FREESTYLE LIBRE 3 READER) DEVI [id number] Yes 1 Device by Does not apply route continuous. Mercer Clotilda SAUNDERS, MD  Active Self  Continuous Glucose Sensor (FREESTYLE LIBRE 3 SENSOR) [place] [id number] Yes 1 Device by Does not apply route every 14 (fourteen) days. Dartha Ernst, MD  Active Self  ELIQUIS  5 MG TABS tablet [id number] Yes TAKE 1 TABLET BY MOUTH TWICE DAILY McLean, Dalton S, MD  Active Self           Med Note (SATTERFIELD, TEENA E   Wed Jun 03, 2024 10:07 PM) @1800   eplerenone  (INSPRA ) 25 MG tablet [id number] Yes Take 1 tablet (25  mg total) by mouth daily. [name], [place]  Active Self  ezetimibe  (ZETIA ) 10 MG tablet [id number] Yes TAKE 1 TABLET BY MOUTH ONCE DAILY McLean, Dalton S, MD  Active Self  folic acid  (FOLVITE ) 1 MG tablet [id number] Yes Take 1 tablet (1 mg total) by mouth daily. Al-Sultani, Anmar, MD  Active   Glucose Blood (BLOOD GLUCOSE TEST STRIPS) STRP [id number] Yes 1 each by In Vitro route in the morning, at noon, and at bedtime. May substitute to any manufacturer covered by patient's insurance. Mercer Clotilda SAUNDERS, MD  Active Self  insulin  glargine (LANTUS  SOLOSTAR) 100 UNIT/ML Solostar Pen [id number] Yes Inject 20 Units into the skin at bedtime. Al-Sultani, Anmar, MD  Active   insulin  lispro (HUMALOG ) 100 UNIT/ML injection [id number] Yes Inject 1 Units into the skin 3 (three) times daily with meals. Sliding scale 70-140=0 Units 141-170=1 Units 171-200=2 Units 201-230=3 Units 231-260=4 Units 261-290=5 Units 291-320=6 Units 231-350=7 Units 351-380=8 Units Over 380 10 units [provider]  Active Self  Insulin  Pen Needle 32G X 4 MM MISC [id number] Yes Use as directed with Lantus . Al-Sultani, Anmar, MD  Active   isosorbide  mononitrate (IMDUR ) 120 MG 24 hr tablet [id number] Yes TAKE 1 TABLET BY MOUTH ONCE DAILY Rolan Ezra RAMAN, MD  Active Self  JARDIANCE  25 MG TABS tablet  [id number] Yes TAKE 1 TABLET BY MOUTH ONCE DAILY CONTACT OFFICE FOR APPOINTMENT Dartha Ernst, MD  Active Self  Lancets Misc. MISC [id number] Yes 1 each by Does not apply route in the morning, at noon, and at bedtime. May substitute to any manufacturer covered by patient's insurance. Mercer Clotilda SAUNDERS, MD  Active Self  losartan  (COZAAR ) 25 MG tablet [id number] Yes Take 1 tablet (25 mg total) by mouth daily. [place], [place], [place]  Active Self  metFORMIN  (GLUCOPHAGE ) 1000 MG tablet [id number] Yes TAKE 1 TABLET BY MOUTH ONCE DAILY WITH BREAKFAST *PATIENT NEEDS APPOINTMENT* Motwani, Komal, MD  Active   metoprolol succinate (TOPROL-XL) 25  MG 24 hr tablet [id number] Yes Take 1 tablet (25 mg total) by mouth daily. Al-Sultani, Anmar, MD  Active   Multiple Vitamin (MULTIVITAMIN WITH MINERALS) TABS tablet [id number] Yes Take 1 tablet by mouth daily. Al-Sultani, Anmar, MD  Active   nitroGLYCERIN  (NITROSTAT ) 0.4 MG SL tablet [id number] Yes DISSOLVE 1 TABLET UNDER THE TONGUE AS NEEDED FOR CHEST PAIN EVERY 5 MINUTES UP TO 3 TIMES. IF NO RELIEF CALL 911. Bensimhon, Daniel R, MD  Active   polyethylene glycol powder (GLYCOLAX/MIRALAX) 17 GM/SCOOP powder [id number] Yes Dissolve 1 capful (17g) in 4-8 ounces of liquid and take by mouth daily as needed for mild constipation. Al-Sultani, Anmar, MD  Active   thiamine  (VITAMIN B1) 100 MG tablet [id number] Yes Take 1 tablet (100 mg total) by mouth daily. Al-Sultani, Anmar, MD  Active   torsemide  (DEMADEX ) 20 MG tablet [id number] Yes Take 2 tablets (40 mg total) by mouth daily as needed (as needed for weight gain >2 lbs in 24 hrs or > 5 lbs in a week , increased leg swelling, increased shortness of breath. May take another dose if symptoms persist and contact provider). Al-Sultani, Anmar, MD  Active             Goals Addressed             This Visit's Progress    [facility] Transitions of Care (TOC) Care Plan       Problems:  Recent Hospitalization for treatment of admission for hypertensive crisis, ischemic stroke  06/15/2024 daughter reports BP 160/90.    Weight is stable.  No swelling,  Daughter reports getting up and down difficulty.  Vision problems and neuropathy.  Medication management barrier does not have Losartan .   06/15/2024  Patient is taking losartan .   No Hospital Follow Up Provider appointment.   06/15/2024  Has an appointment with Dr. Carlie.  Pending delivery of rollator by [employer],  06/11/2024  Call back from daughter who states they have not received rollator.  06/15/2024 daughter reports that they still have not gotten rollator.  Pending start of home health  06/11/2024  Call back from  daughter who states they have not heard from [facility]. 06/15/2024  daughter confirms patient has started his PT Diarrhea - daughter thinks related to Metformin   06/15/2024  Daughter reports diarrhea has stopped. BP elevated:  06/15/2024  Reviewed home BP readings and BP is up and ranging 160/90   Goal:  Over the next 30 days, the patient will not experience hospital readmission  Interventions:  Transitions of Care: Doctor Visits  - discussed the importance of doctor visits Confirmed that patient is active with PT. Reviewed that patient has not gotten his rollator. Placed call to [facility] and was informed it would be delivered today. I was informed that order was place but Rollator never delivered.  Reviewed recent  CBG reading and recently lows despite patient eating well. Reviewed importance of protein filled bedtime snack. Daughter is comfortable with treatment for hypoglycemia.  Confirmed that patient has his medications and is taking them as prescribed Reviewed importance of daily BP checks twice a day and recording. Reviewed heart healthy diet and low sodium Reviewed signs and symptoms of stroke and when to call 911. Provided my contact information. Next telephone call scheduled Encouraged daughter to send MY Chart message to cardiology about elevated BP  Provided delivery address to [facility] for rollator to be delivered to daughters home.  This note sent to PCP with concerns about BP. Patient has PCP appointment in 2 days.    Heart Failure Interventions: Discussed the importance of keeping all appointments with provider Reviewed weights. No weigh gain since hospital discharge  Hypertension Interventions: Last practice recorded BP readings:  BP Readings from Last 3 Encounters:  06/09/24 (!) 154/71  06/08/24 (!) 158/86  05/27/24 (!) 164/98    Evaluation of current treatment plan related to hypertension self management and patient's adherence to plan as established by  provider Provided education to patient re: stroke prevention, s/s of heart attack and stroke Reviewed medications with patient and discussed importance of compliance Discussed plans with patient for ongoing care management follow up and provided patient with direct contact information for care management team Advised patient, providing education and rationale, to monitor blood pressure daily and record, calling PCP for findings outside established parameters.  Reviewed this again and discussed with daughter recent BP readings are too high. Confirmed patient is taking his medications as prescribed.  Reviewed scheduled/upcoming provider appointments including: PCP Provided education on prescribed diet low sodium heart health diet Reviewed and encouraged BP home monitoring twice a day and taking LOG to MD appointments. Stroke: Reviewed Importance of taking all medications as prescribed Reviewed Importance of attending all scheduled provider appointments Assessed for signs and symptoms of stroke Reviewed referrals to home health Assessed for cognitive impairment Assessed for fall status and safety in the home  Patient Self Care Activities:  Attend all scheduled provider appointments Call pharmacy for medication refills 3-7 days in advance of running out of medications Call provider office for new concerns or questions  Notify RN Care Manager of TOC call rescheduling needs Participate in Transition of Care Program/Attend TOC scheduled calls Take medications as prescribed   Work with the pharmacist to address medication management needs and will continue to work with the clinical team to address health care and disease management related needs call office if I gain more than 2 pounds in one day or 5 pounds in one week do ankle pumps when sitting track weight in diary use salt in moderation watch for swelling in feet, ankles and legs every day weigh myself daily track symptoms and what helps  feel better or worse check blood pressure daily write blood pressure results in a log or diary keep a blood pressure log take blood pressure log to all doctor appointments call doctor for signs and symptoms of high blood pressure keep all doctor appointments take medications for blood pressure exactly as prescribed report new symptoms to your doctor Call 911 for any signs of stroke  Call 911 for chest pain or shortness of breath Seek medication attention after any fall. Daughter to send My Chart message to cardiology  Plan:  Telephone follow up appointment with care management team member scheduled for:  06/22/2024 The patient has been provided with contact information for the care management  team and has been advised to call with any health related questions or concerns.         Recommendation:   Continue Current Plan of Care Send my chart message to cardiology about BP.  Follow Up Plan:   Telephone follow up appointment date/time:  06/22/2024   Alan Ee, RN, BSN, CEN Population [facility]- Transition of Care Team.  [facility] [phone]

## 2024-06-15 NOTE — Telephone Encounter (Signed)
 Called and left a VM to Almira with [facility] given a VO for P.T. Per Dr. Mercer

## 2024-06-17 ENCOUNTER — Inpatient Hospital Stay: Admitting: Family Medicine

## 2024-06-22 ENCOUNTER — Other Ambulatory Visit: Payer: Self-pay

## 2024-06-22 NOTE — Patient Instructions (Signed)
 Visit Information  Thank you for taking time to visit with me today. Please don't hesitate to contact me if I can be of assistance to you before our next scheduled telephone appointment.  Following are the goals we discussed today:   Goals Addressed             This Visit's Progress    [facility] Transitions of Care (TOC) Care Plan       Problems:  Recent Hospitalization for treatment of admission for hypertensive crisis, ischemic stroke  06/15/2024 daughter reports BP 160/90.    Weight is stable.  No swelling,  Daughter reports getting up and down difficulty.  Vision problems and neuropathy. 06/22/2024  Daughter reports no weight gain and no swelling.  Medication management barrier does not have Losartan .   06/15/2024  Patient is taking losartan .   06/22/2024 Daughter reports patient is taking all medications as prescribed. Has not had to take any insulin  since hospital discharge. CBG under good control per daughter.  No Hospital Follow Up Provider appointment.   06/15/2024  Has an appointment with Dr. Carlie. 06/22/2024 Hospital follow up was cancelled by MD office and is rescheduled for this week.  Pending delivery of rollator by [employer],  06/11/2024  Call back from daughter who states they have not received rollator.  06/15/2024 daughter reports that they still have not gotten rollator. 06/22/2024 Daughter confirms patient got rollator and is using it well.  Pending start of home health  06/11/2024  Call back from daughter who states they have not heard from [facility]. 06/15/2024  daughter confirms patient has started his PT.   06/22/2024  Daughter confirms that patient remain active with PT twice a week.  Diarrhea - daughter thinks related to Metformin   06/15/2024  Daughter reports diarrhea has stopped 06/22/2024 daughter believes patient is having diarrhea because he goes straight to the bathroom after eating.  BP elevated:  06/15/2024  Reviewed home BP readings and BP is up and ranging 160/90.   06/22/2024 BP remains elevated and daughter to discuss with PCP and reviewed home reading at [facility] this week.  Patient is taking all medications as prescribed.    Goal:  Over the next 30 days, the patient will not experience hospital readmission  Interventions:  Transitions of Care: Doctor Visits  - discussed the importance of doctor visits Confirmed that patient is active with PT. Reviewed recent CBG reading- reading today of 96 Confirmed that patient has his medications and is taking them as prescribed Reviewed importance of daily BP checks twice a day and recording. Next telephone call scheduled Reviewed [facility] program and suggested patient take a tour. Daughter states she will check into this.    Heart Failure Interventions: Discussed the importance of keeping all appointments with provider Reviewed weights. No weigh gain since hospital discharge  Hypertension Interventions: Last practice recorded BP readings:  BP Readings from Last 3 Encounters:  06/22/24 (!) 154/89  06/15/24 (!) 163/90  06/09/24 (!) 154/71    Evaluation of current treatment plan related to hypertension self management and patient's adherence to plan as established by provider Provided education to patient re: stroke prevention, s/s of heart attack and stroke Reviewed medications with patient and discussed importance of compliance Discussed plans with patient for ongoing care management follow up and provided patient with direct contact information for care management team Advised patient, providing education and rationale, to monitor blood pressure daily and record, calling PCP for findings outside established parameters.  Reviewed this again and discussed  with daughter recent BP readings are too high. Confirmed patient is taking his medications as prescribed.  Reviewed scheduled/upcoming provider appointments including: PCP Provided education on prescribed diet low sodium heart health diet Reviewed and  encouraged BP home monitoring twice a day and taking LOG to MD appointments. Reviewed current BP and weights Stroke: Reviewed Importance of taking all medications as prescribed Reviewed Importance of attending all scheduled provider appointments Assessed for signs and symptoms of stroke Reviewed when to call 911.   Patient Self Care Activities:  Attend all scheduled provider appointments Call pharmacy for medication refills 3-7 days in advance of running out of medications Call provider office for new concerns or questions  Notify RN Care Manager of TOC call rescheduling needs Participate in Transition of Care Program/Attend TOC scheduled calls Take medications as prescribed   call office if I gain more than 2 pounds in one day or 5 pounds in one week do ankle pumps when sitting track weight in diary use salt in moderation watch for swelling in feet, ankles and legs every day weigh myself daily track symptoms and what helps feel better or worse check blood pressure daily write blood pressure results in a log or diary keep a blood pressure log take blood pressure log to all doctor appointments call doctor for signs and symptoms of high blood pressure keep all doctor appointments take medications for blood pressure exactly as prescribed report new symptoms to your doctor Call 911 for any signs of stroke  Seek medication attention after any fall. See PCP this week Consider tour at [facility]   Plan:  Telephone follow up appointment with care management team member scheduled for:  06/29/2024 The patient has been provided with contact information for the care management team and has been advised to call with any health related questions or concerns.          Our next appointment is by telephone on 06/29/2024 at 1130 am  Please call the care guide team at [phone] if you need to cancel or reschedule your appointment.   If you are experiencing a Mental Health or Behavioral  Health Crisis or need someone to talk to, please call the Suicide and Crisis Lifeline: 988 call the [place]  National Suicide Prevention Lifeline: (316) 877-9600 or TTY: (814)173-2123 TTY (339)326-1107) to talk to a trained counselor call 1-800-273-TALK (toll free, 24 hour hotline) call 911   Care plan and visit instructions communicated with the patient verbally today. Patient agrees to receive a copy in MyChart. Active MyChart status and patient understanding of how to access instructions and care plan via MyChart confirmed with patient.     Alan Ee, RN, BSN, CEN [employer]- Transition of Care Team.  [facility] [phone]

## 2024-06-22 NOTE — Transitions of Care (Post Inpatient/ED Visit) (Signed)
 Transition of Care week 3  Visit Note  06/22/2024  Name: [name] [name] MRN: [id number]          DOB: Sep 07, 1955  Situation: Patient enrolled in [facility] 30-day program. Visit completed with [name] ( daughter) by telephone.   Background:   Initial Transition Care Management Follow-up Telephone Call Discharge Date and Diagnosis: 06/08/24, Hypertensive emergency without congestive heart failure   Past Medical History:  Diagnosis Date   Anxiety    CAD (coronary artery disease), native coronary artery    a. Nonobstructive CAD by cath 2013 - diffuse distal and branch vessel CAD, no severe disease in the major coronaries, LV mild global hypokinesis, EF 45%. b. ETT-Sestamibi 5/14: EF 31%, small fixed inferior defect with no ischemia.   Chronic CHF (HCC)    a. Mixed ICM/NICM (?EtOH). EF 35% in 2008. Echo 5/13: EF 60-65%, mod LVH, EF 45% on V gram in 12/2011. EF 12/2012: EF 50-55%, mild LVH, inferobasal HK, mild MR. ETT-Ses 5/14 EF 41%. Cardiac MRI 5/14: EF 44%, mild global HK, subepicardial delayed enhancement in nonspecific RV insertion pattern.   COLONIC POLYPS, HX OF 12/30/2006   Gout    H/O atrial flutter 08/13/2005   a. Ablations in 2007, 2008.   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  Meals are provided and medications as given as prescribed/ Daughter states that  patient continues to have memory concerns. Reports hospital follow up appointments have been changed 2 times by office.  Patient has another appointment this week .  Daughter denies any new concerns today.  Patient Reported Symptoms: Cognitive Cognitive Status: Other: (phone call with daughter.)      Neurological Neurological Review of Symptoms: Weakness Neurological Management Strategies: Medical device Neurological Comment: using walker and continues to be active with PT.  HEENT HEENT Symptoms Reported: Not assessed      Cardiovascular Cardiovascular Symptoms Reported: Other: Other Cardiovascular Symptoms: Continues to have elevated BP at home. daughter is keeping a log.  Weight unchanged. Does patient have uncontrolled Hypertension?: Yes Is patient checking Blood Pressure at home?: Yes Patient's Recent BP reading at home: 154/89 Cardiovascular Management Strategies: Medication therapy Weight: 187 lb 9.6 oz (85.1 kg)  Respiratory Respiratory Symptoms Reported: No symptoms reported    Endocrine Endocrine Symptoms Reported: No symptoms reported Is patient diabetic?: Yes Is patient checking blood sugars at home?: Yes List most recent blood sugar readings, include date and time of day: CBG this am of 96 Endocrine Self-Management Outcome: 5 (very good) Endocrine Comment: Daughter reports that patient has not needed any insulin .  Gastrointestinal Gastrointestinal Symptoms Reported: No symptoms reported Additional Gastrointestinal Details: daughter states that she thinks he is having diarrhea but not for sure.      Genitourinary Genitourinary Symptoms Reported: Not assessed    Integumentary Integumentary Symptoms Reported: Not assessed  Musculoskeletal Musculoskelatal Symptoms Reviewed: Unsteady gait Additional Musculoskeletal Details: has new walker and is using it. Musculoskeletal Management Strategies: Medical device Musculoskeletal Self-Management Outcome: 5 (very good)       Psychosocial Additional Psychological Details: Discussed with daughter about option for PACE program. Suggested patient and daugther take a tour.         Vitals:   06/22/24 1140  BP: (!) 154/89    Medications Reviewed Today     Reviewed by Rumalda Alan PENNER, RN (Registered Nurse) on 06/22/24 at 1130  Med List Status: <None>   Medication Order Taking? Sig Documenting Provider Last Dose Status Informant  acetaminophen  (TYLENOL ) 500 MG tablet [id number] Yes Take 1,000 mg by mouth every 6 (six) hours as needed (pain). [provider]  Active Self  albuterol  (VENTOLIN  HFA) 108 (90 Base) MCG/ACT inhaler [id number] Yes Inhale 1-2 puffs into the lungs every 6 (six) hours as needed for wheezing or shortness of breath. Babara Greig GAILS, PA-C  Active Self  amLODipine  (NORVASC ) 10 MG tablet [id number] Yes Take 1 tablet (10 mg total) by mouth daily. Al-Sultani, Anmar, MD  Active   atorvastatin  (LIPITOR ) 80 MG tablet [id number] Yes TAKE 1 TABLET BY MOUTH ONCE DAILY Bensimhon, Toribio SAUNDERS, MD  Active Self  Blood Glucose Monitoring Suppl DEVI [id number] Yes 1 each by Does not apply route in the morning, at noon, and at bedtime. May substitute to any manufacturer covered by patient's insurance. Mercer Clotilda SAUNDERS, MD  Active Self  Continuous Glucose Receiver (FREESTYLE LIBRE 3 READER) DEVI [id number] Yes 1 Device by Does not apply route continuous. Mercer Clotilda SAUNDERS, MD  Active Self  Continuous Glucose Sensor (FREESTYLE LIBRE 3 SENSOR) [place] [id number] Yes 1 Device by Does not apply route every 14 (fourteen) days. Dartha Ernst, MD  Active Self  ELIQUIS  5 MG TABS tablet [id number] Yes TAKE 1 TABLET BY MOUTH TWICE DAILY McLean, Dalton S, MD  Active Self           Med Note (SATTERFIELD, TEENA BRAVO   Wed Jun 03, 2024 10:07 PM) @1800   eplerenone  (INSPRA ) 25 MG tablet [id number] Yes Take 1 tablet (25 mg total) by mouth daily. [name], [place]  Active Self  ezetimibe  (ZETIA ) 10 MG tablet [id number] Yes TAKE 1 TABLET BY  MOUTH ONCE DAILY McLean, Dalton S, MD  Active Self  folic acid  (FOLVITE ) 1 MG tablet [id number] Yes Take 1 tablet (1 mg total) by mouth daily. Al-Sultani, Anmar, MD  Active   Glucose Blood (BLOOD GLUCOSE TEST STRIPS) STRP [id number] Yes 1 each by In Vitro route in the morning, at noon, and at bedtime. May substitute to any manufacturer covered by patient's insurance. Mercer Clotilda SAUNDERS, MD  Active Self  insulin  glargine (LANTUS  SOLOSTAR) 100 UNIT/ML Solostar Pen [id number] Yes Inject 20 Units into the skin at bedtime. Al-Sultani, Anmar, MD  Active   insulin  lispro (HUMALOG ) 100 UNIT/ML injection [id number] Yes Inject 1 Units into the skin 3 (three) times daily with meals. Sliding scale 70-140=0 Units 141-170=1 Units 171-200=2 Units 201-230=3 Units 231-260=4 Units 261-290=5 Units 291-320=6 Units 231-350=7 Units 351-380=8 Units Over 380 10 units [provider]  Active Self  Insulin  Pen Needle 32G X 4 MM MISC [id number] Yes Use as directed with Lantus . Al-Sultani, Anmar, MD  Active   isosorbide  mononitrate (IMDUR ) 120 MG 24 hr tablet [id number] Yes TAKE 1 TABLET BY MOUTH ONCE DAILY McLean, Dalton S, MD  Active Self  JARDIANCE  25 MG TABS tablet [id number] Yes TAKE 1 TABLET BY  MOUTH ONCE DAILY CONTACT OFFICE FOR APPOINTMENT Dartha Ernst, MD  Active Self  Lancets Misc. MISC [id number] Yes 1 each by Does not apply route in the morning, at noon, and at bedtime. May substitute to any manufacturer covered by patient's insurance. Mercer Clotilda SAUNDERS, MD  Active Self  losartan  (COZAAR ) 25 MG tablet [id number] Yes Take 1 tablet (25 mg total) by mouth daily. Gardiner, [place], [place]  Active Self  metFORMIN  (GLUCOPHAGE ) 1000 MG tablet [id number] Yes TAKE 1 TABLET BY MOUTH ONCE DAILY WITH BREAKFAST *PATIENT NEEDS APPOINTMENT* Motwani, Komal, MD  Active   metoprolol succinate (TOPROL-XL) 25 MG 24 hr tablet [id number] Yes Take 1 tablet (25 mg total) by mouth daily. Al-Sultani, Anmar, MD  Active   Multiple Vitamin  (MULTIVITAMIN WITH MINERALS) TABS tablet [id number] Yes Take 1 tablet by mouth daily. Al-Sultani, Anmar, MD  Active   nitroGLYCERIN  (NITROSTAT ) 0.4 MG SL tablet [id number] Yes DISSOLVE 1 TABLET UNDER THE TONGUE AS NEEDED FOR CHEST PAIN EVERY 5 MINUTES UP TO 3 TIMES. IF NO RELIEF CALL 911. Bensimhon, Daniel R, MD  Active   polyethylene glycol powder (GLYCOLAX/MIRALAX) 17 GM/SCOOP powder [id number] Yes Dissolve 1 capful (17g) in 4-8 ounces of liquid and take by mouth daily as needed for mild constipation. Al-Sultani, Anmar, MD  Active   thiamine  (VITAMIN B1) 100 MG tablet [id number] Yes Take 1 tablet (100 mg total) by mouth daily. Al-Sultani, Anmar, MD  Active   torsemide  (DEMADEX ) 20 MG tablet [id number] Yes Take 2 tablets (40 mg total) by mouth daily as needed (as needed for weight gain >2 lbs in 24 hrs or > 5 lbs in a week , increased leg swelling, increased shortness of breath. May take another dose if symptoms persist and contact provider). Al-Sultani, Anmar, MD  Active             Goals Addressed             This Visit's Progress    [facility] Transitions of Care (TOC) Care Plan       Problems:  Recent Hospitalization for treatment of admission for hypertensive crisis, ischemic stroke  06/15/2024 daughter reports BP 160/90.    Weight is stable.  No swelling,  Daughter reports getting up and down difficulty.  Vision problems and neuropathy. 06/22/2024  Daughter reports no weight gain and no swelling.  Medication management barrier does not have Losartan .   06/15/2024  Patient is taking losartan .   06/22/2024 Daughter reports patient is taking all medications as prescribed. Has not had to take any insulin  since hospital discharge. CBG under good control per daughter.  No Hospital Follow Up Provider appointment.   06/15/2024  Has an appointment with Dr. Carlie. 06/22/2024 Hospital follow up was cancelled by MD office and is rescheduled for this week.  Pending delivery of rollator by [employer],  06/11/2024   Call back from daughter who states they have not received rollator.  06/15/2024 daughter reports that they still have not gotten rollator. 06/22/2024 Daughter confirms patient got rollator and is using it well.  Pending start of home health  06/11/2024  Call back from daughter who states they have not heard from [facility]. 06/15/2024  daughter confirms patient has started his PT.   06/22/2024  Daughter confirms that patient remain active with PT twice a week.  Diarrhea - daughter thinks related to Metformin   06/15/2024  Daughter reports diarrhea has stopped 06/22/2024 daughter believes patient is having diarrhea because he goes straight to the bathroom after eating.  BP elevated:  06/15/2024  Reviewed home BP readings and BP is up and ranging 160/90.  06/22/2024 BP remains elevated and daughter to discuss with PCP and reviewed home reading at [facility] this week.  Patient is taking all medications as prescribed.    Goal:  Over the next 30 days, the patient will not experience hospital readmission  Interventions:  Transitions of Care: Doctor Visits  - discussed the importance of doctor visits Confirmed that patient is active with PT. Reviewed recent CBG reading- reading today of 96 Confirmed that patient has his medications and is taking them as prescribed Reviewed importance of daily BP checks twice a day and recording. Next telephone call scheduled Reviewed PACE program and suggested patient take a tour. Daughter states she will check into this.    Heart Failure Interventions: Discussed the importance of keeping all appointments with provider Reviewed weights. No weigh gain since hospital discharge  Hypertension Interventions: Last practice recorded BP readings:  BP Readings from Last 3 Encounters:  06/22/24 (!) 154/89  06/15/24 (!) 163/90  06/09/24 (!) 154/71    Evaluation of current treatment plan related to hypertension self management and patient's adherence to plan as established by  provider Provided education to patient re: stroke prevention, s/s of heart attack and stroke Reviewed medications with patient and discussed importance of compliance Discussed plans with patient for ongoing care management follow up and provided patient with direct contact information for care management team Advised patient, providing education and rationale, to monitor blood pressure daily and record, calling PCP for findings outside established parameters.  Reviewed this again and discussed with daughter recent BP readings are too high. Confirmed patient is taking his medications as prescribed.  Reviewed scheduled/upcoming provider appointments including: PCP Provided education on prescribed diet low sodium heart health diet Reviewed and encouraged BP home monitoring twice a day and taking LOG to MD appointments. Reviewed current BP and weights Stroke: Reviewed Importance of taking all medications as prescribed Reviewed Importance of attending all scheduled provider appointments Assessed for signs and symptoms of stroke Reviewed when to call 911.   Patient Self Care Activities:  Attend all scheduled provider appointments Call pharmacy for medication refills 3-7 days in advance of running out of medications Call provider office for new concerns or questions  Notify RN Care Manager of TOC call rescheduling needs Participate in Transition of Care Program/Attend TOC scheduled calls Take medications as prescribed   call office if I gain more than 2 pounds in one day or 5 pounds in one week do ankle pumps when sitting track weight in diary use salt in moderation watch for swelling in feet, ankles and legs every day weigh myself daily track symptoms and what helps feel better or worse check blood pressure daily write blood pressure results in a log or diary keep a blood pressure log take blood pressure log to all doctor appointments call doctor for signs and symptoms of high blood  pressure keep all doctor appointments take medications for blood pressure exactly as prescribed report new symptoms to your doctor Call 911 for any signs of stroke  Seek medication attention after any fall. See PCP this week Consider tour at [facility]   Plan:  Telephone follow up appointment with care management team member scheduled for:  06/29/2024 The patient has been provided with contact information for the care management team and has been advised to call with any health related questions or concerns.          Recommendation:  Continue Current Plan of Care  Follow Up Plan:   Telephone follow up appointment date/time:  06/29/2024 at 1130 am  Alan Ee, RN, BSN, [employer]- Transition of Care Team.  [facility] [phone]

## 2024-06-24 ENCOUNTER — Encounter: Payer: Self-pay | Admitting: Family Medicine

## 2024-06-24 ENCOUNTER — Ambulatory Visit: Payer: Self-pay | Admitting: Family Medicine

## 2024-06-24 ENCOUNTER — Ambulatory Visit: Admitting: Family Medicine

## 2024-06-24 VITALS — BP 152/84 | HR 44 | Temp 97.7°F | Ht 72.0 in | Wt 194.8 lb

## 2024-06-24 DIAGNOSIS — I110 Hypertensive heart disease with heart failure: Secondary | ICD-10-CM | POA: Diagnosis not present

## 2024-06-24 DIAGNOSIS — R001 Bradycardia, unspecified: Secondary | ICD-10-CM

## 2024-06-24 DIAGNOSIS — Z09 Encounter for follow-up examination after completed treatment for conditions other than malignant neoplasm: Secondary | ICD-10-CM | POA: Diagnosis not present

## 2024-06-24 DIAGNOSIS — I4819 Other persistent atrial fibrillation: Secondary | ICD-10-CM

## 2024-06-24 DIAGNOSIS — I679 Cerebrovascular disease, unspecified: Secondary | ICD-10-CM

## 2024-06-24 DIAGNOSIS — Z794 Long term (current) use of insulin: Secondary | ICD-10-CM | POA: Diagnosis not present

## 2024-06-24 DIAGNOSIS — N179 Acute kidney failure, unspecified: Secondary | ICD-10-CM

## 2024-06-24 DIAGNOSIS — Z23 Encounter for immunization: Secondary | ICD-10-CM | POA: Diagnosis not present

## 2024-06-24 DIAGNOSIS — E1165 Type 2 diabetes mellitus with hyperglycemia: Secondary | ICD-10-CM

## 2024-06-24 DIAGNOSIS — I5032 Chronic diastolic (congestive) heart failure: Secondary | ICD-10-CM | POA: Diagnosis not present

## 2024-06-24 DIAGNOSIS — I10 Essential (primary) hypertension: Secondary | ICD-10-CM

## 2024-06-24 LAB — COMPREHENSIVE METABOLIC PANEL WITH GFR
ALT: 20 U/L (ref 0–53)
AST: 22 U/L (ref 0–37)
Albumin: 4 g/dL (ref 3.5–5.2)
Alkaline Phosphatase: 57 U/L (ref 39–117)
BUN: 21 mg/dL (ref 6–23)
CO2: 27 meq/L (ref 19–32)
Calcium: 9.8 mg/dL (ref 8.4–10.5)
Chloride: 104 meq/L (ref 96–112)
Creatinine, Ser: 1.32 mg/dL (ref 0.40–1.50)
GFR: 54.76 mL/min — ABNORMAL LOW (ref >60.00)
Glucose, Bld: 84 mg/dL (ref 70–99)
Potassium: 4.5 meq/L (ref 3.5–5.1)
Sodium: 140 meq/L (ref 135–145)
Total Bilirubin: 0.6 mg/dL (ref 0.2–1.2)
Total Protein: 7.3 g/dL (ref 6.0–8.3)

## 2024-06-24 LAB — MAGNESIUM: Magnesium: 2.3 mg/dL (ref 1.5–2.5)

## 2024-06-24 LAB — MICROALBUMIN / CREATININE URINE RATIO
Creatinine,U: 57.7 mg/dL
Microalb Creat Ratio: 636.3 mg/g — ABNORMAL HIGH (ref 0.0–30.0)
Microalb, Ur: 36.7 mg/dL — ABNORMAL HIGH (ref 0.0–1.9)

## 2024-06-24 MED ORDER — LOSARTAN POTASSIUM 50 MG PO TABS: 50.0000 mg | ORAL_TABLET | Freq: Every day | ORAL | 3 refills | Status: AC

## 2024-06-24 MED ORDER — METOPROLOL SUCCINATE ER 25 MG PO TB24: 12.5000 mg | ORAL_TABLET | Freq: Every day | ORAL | 3 refills | Status: DC

## 2024-06-24 NOTE — Progress Notes (Signed)
 Established Patient Office Visit   Subjective  Patient ID: [name] [name], male    DOB: 1956-02-09  Age: 68 y.o. MRN: [id number]  Chief Complaint  Patient presents with   Hospitalization Follow-up    Patient came in today for a hospital follow-up, seen 06/03/24 hypertensive emergency   Pt accompanied by his daughter  Pt is a 68 yo male with pmh sig for HTN, afib on apixaban , HLD, uncontrolled DM 2, CKD (baseline creatinine appears to be 1.4-1.6) who was hospitalized 10/22-10/27/2025 for hypertensive emergency.  Pt p/w SOB causing inability to get air while trying to sleep. Pt indicated missing several medications but was unsure of which ones or how many days were missed.  In ED, pt hypertensive 170-190s and tachypneic 27 bpm on RA. Labs notable for  K 3.3, BNP 321, and troponin 81 > 75.  IV lasix  20mg  x1 and nitroglycerin  gtt  started in ED.  Medicine admission requested.  Cards consulted.  D/c'd on amlodipine  10 mg daily, losartan  25 mg daily, Imdur  120 mg daily, eplerenone  25 mg daily, Toprol -XL 25 mg daily, PRN Torsemide .  Multifocal ischemic strokes likely embolic from noncompliance with Eliquis .  Neuro consulted.  MRI brain showed several punctate acute and subacute infarcts involving both cerebral hemispheres and the cerebellum.  MRA showed occlusion of nondominant left vertebral artery at the dural margin.  US  carotid unremarkable.  EEG normal.TTE showed LVEF 55-60%, mild LVH.  Continue Lipitor  80 mg daily and Eliquis  5 mg BID.  BP elevated since d/c, some readings in the 180s. Endorses dizziness.  Taking meds.  HR consistently low, often in the 50s and sometimes dropping into the 40s, which is associated with the dizziness.   BS improving. Has not required insulin  due to dietary changes. He is currently on metformin , which causes gastrointestinal discomfort, and he prefers not to take it.  He has experienced cognitive changes since a recent stroke, which he does not fully  acknowledge. His daughter notes variability in his cognitive function, with some days being better than others. He has difficulty with memory and word-finding, and supervision is required for daily activities.  Having cramps in legs and occasional shooting pain in his eye, which he associates with his blood pressure. He recently had his vision checked and was diagnosed with severe dry eyes, for which he was prescribed eye drops.    Patient Active Problem List   Diagnosis Date Noted   Acute arterial ischemic stroke, multifocal, multiple vascular territories (HCC) 06/12/2024   Right-sided headache 06/12/2024   (HFpEF) heart failure with preserved ejection fraction (HCC) 06/12/2024   Chronic alcohol abuse 06/12/2024   Wrist fracture, left, closed, initial encounter 06/05/2022   Rib pain 06/05/2022   [place] (type) [facility]) atrioventricular block 01/23/2022   Chest pain of uncertain etiology 01/22/2022   Syncope 02/09/2020   Generalized weakness 06/27/2019   Acute on chronic congestive heart failure with left ventricular diastolic dysfunction (HCC) 06/26/2019   Anemia due to chronic kidney disease 06/26/2019   CKD (chronic kidney disease), stage III (HCC) 06/26/2019   Type 2 diabetes mellitus without complications (HCC) 06/26/2019   Iron deficiency anemia due to chronic blood loss 06/26/2019   History of atrial fibrillation    History of type 2 diabetes mellitus    Anemia    Chest pain 04/24/2019   Overweight (BMI 25.0-29.9) 02/28/2017   Unstable angina (HCC) 08/03/2016   S/P laparoscopic cholecystectomy 02/07/2016   Paroxysmal A-fib (HCC) 01/22/2016   Coronary artery disease  involving native coronary artery with unstable angina pectoris (HCC) 12/29/2015   Chronic anticoagulation 08/22/2015   Atrial flutter with controlled response (HCC) 08/22/2015   AKI (acute kidney injury)    RUQ pain    Cholelithiases- drain in place 06/04/2015   Primary hypertension    Chronic diastolic  CHF (congestive heart failure) (HCC) 04/24/2015   Excessive daytime sleepiness 11/25/2014   Obstructive sleep apnea 11/25/2014   NICM (nonischemic cardiomyopathy) (HCC) 03/11/2014   Anticoagulation goal of INR 2 to 3 10/19/2013   Eczema 10/19/2013   Low libido 10/16/2013   Persistent atrial fibrillation (HCC) 10/06/2013   Rectal bleeding 12/18/2011   HYPERCHOLESTEROLEMIA 07/11/2010   DENTAL CARIES 06/30/2010   GOUT 05/05/2009   HEPATITIS B, CHRONIC 12/30/2006   OSTEOCHONDROMA 12/30/2006   Mitral valve disorder 12/30/2006   LIVER FUNCTION TESTS, ABNORMAL, HX OF 12/30/2006   History of colonic polyps 12/30/2006   COLONOSCOPY, HX OF 05/12/2001   Past Medical History:  Diagnosis Date   Anxiety    CAD (coronary artery disease), native coronary artery    a. Nonobstructive CAD by cath 2013 - diffuse distal and branch vessel CAD, no severe disease in the major coronaries, LV mild global hypokinesis, EF 45%. b. ETT-Sestamibi 5/14: EF 31%, small fixed inferior defect with no ischemia.   Chronic CHF (HCC)    a. Mixed ICM/NICM (?EtOH). EF 35% in 2008. Echo 5/13: EF 60-65%, mod LVH, EF 45% on V gram in 12/2011. EF 12/2012: EF 50-55%, mild LVH, inferobasal HK, mild MR. ETT-Ses 5/14 EF 41%. Cardiac MRI 5/14: EF 44%, mild global HK, subepicardial delayed enhancement in nonspecific RV insertion pattern.   COLONIC POLYPS, HX OF 12/30/2006   Gout    H/O atrial flutter 08/13/2005   a. Ablations in 2007, 2008.   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   PAF (paroxysmal atrial fibrillation) (HCC)    On coumadin    Sleep apnea    suppose to send mask but they  never did (05/03/2015)   Type II diabetes mellitus (HCC)    Past Surgical History:  Procedure Laterality Date   A FLUTTER ABLATION  2007, 2008   catheter ablation    CARDIAC CATHETERIZATION N/A 05/03/2015   Procedure: Right/Left Heart Cath and Coronary Angiography;  Surgeon: Ezra GORMAN Shuck, MD;  Location: [facility] INVASIVE CV LAB;  Service: Cardiovascular;  Laterality: N/A;   CARDIAC CATHETERIZATION N/A 05/03/2015   Procedure: Coronary Stent Intervention;  Surgeon: Candyce GORMAN Reek, MD;  Location: [facility] INVASIVE CV LAB;  Service: Cardiovascular;  Laterality: N/A;   CARDIAC CATHETERIZATION N/A 11/04/2015   Procedure: Left Heart Cath and Coronary Angiography;  Surgeon: Ezra GORMAN Shuck, MD;  Location: [facility] INVASIVE CV LAB;  Service: Cardiovascular;  Laterality: N/A;   CARDIOVERSION N/A 08/24/2015   Procedure: CARDIOVERSION;  Surgeon: Aleene JINNY Passe, MD;  Location: [facility] ENDOSCOPY;  Service: Cardiovascular;  Laterality: N/A;   CARDIOVERSION N/A 12/14/2022   Procedure: CARDIOVERSION;  Surgeon: Shuck Ezra GORMAN, MD;  Location: [facility] INVASIVE CV LAB;  Service: Cardiovascular;  Laterality: N/A;   CARDIOVERSION N/A 10/02/2023   Procedure: CARDIOVERSION;  Surgeon: Shuck Ezra GORMAN, MD;  Location: [facility] INVASIVE CV LAB;  Service: Cardiovascular;  Laterality: N/A;   CHOLECYSTECTOMY N/A 02/07/2016   Procedure: LAPAROSCOPIC CHOLECYSTECTOMY WITH  INTRAOPERATIVE CHOLANGIOGRAM;  Surgeon: Camellia Blush, MD;  Location: [facility] ORS;  Service: General;  Laterality: N/A;   COLONOSCOPY WITH PROPOFOL  N/A 04/28/2019   Procedure: COLONOSCOPY WITH PROPOFOL ;  Surgeon: Saintclair Jasper, MD;  Location: [facility] ENDOSCOPY;  Service: Gastroenterology;  Laterality: N/A;   CORONARY ANGIOPLASTY  12/05/01   ESOPHAGOGASTRODUODENOSCOPY (EGD) WITH PROPOFOL  N/A 04/27/2019   Procedure: ESOPHAGOGASTRODUODENOSCOPY (EGD) WITH PROPOFOL ;  Surgeon: Saintclair Jasper, MD;  Location: [facility] ENDOSCOPY;  Service: Gastroenterology;  Laterality: N/A;   GIVENS CAPSULE STUDY N/A 06/29/2019    Procedure: GIVENS CAPSULE STUDY;  Surgeon: Dianna Specking, MD;  Location: [facility] ENDOSCOPY;  Service: Endoscopy;  Laterality: N/A;   LEFT HEART CATH AND CORONARY ANGIOGRAPHY N/A 12/28/2016   Procedure: Left Heart Cath and Coronary Angiography;  Surgeon: Rolan Ezra RAMAN, MD;  Location: [facility] INVASIVE CV LAB;  Service: Cardiovascular;  Laterality: N/A;   LEFT HEART CATHETERIZATION WITH CORONARY ANGIOGRAM N/A 12/19/2011   Procedure: LEFT HEART CATHETERIZATION WITH CORONARY ANGIOGRAM;  Surgeon: Ezra RAMAN Rolan, MD;  Location: [facility] CATH LAB;  Service: Cardiovascular;  Laterality: N/A;   OSTEOCHONDROMA EXCISION Left 1972   took bone tumor off my shoulder   POLYPECTOMY  04/28/2019   Procedure: POLYPECTOMY;  Surgeon: Saintclair Jasper, MD;  Location: [facility] ENDOSCOPY;  Service: Gastroenterology;;   PRESSURE SENSOR/CARDIOMEMS N/A 02/01/2020   Procedure: PRESSURE SENSOR/CARDIOMEMS;  Surgeon: Rolan Ezra RAMAN, MD;  Location: [facility] INVASIVE CV LAB;  Service: Cardiovascular;  Laterality: N/A;   RIGHT HEART CATH N/A 02/01/2020   Procedure: RIGHT HEART CATH;  Surgeon: Rolan Ezra RAMAN, MD;  Location: [facility] INVASIVE CV LAB;  Service: Cardiovascular;  Laterality: N/A;   RIGHT/LEFT HEART CATH AND CORONARY ANGIOGRAPHY N/A 07/01/2019   Procedure: RIGHT/LEFT HEART CATH AND CORONARY ANGIOGRAPHY;  Surgeon: Rolan Ezra RAMAN, MD;  Location: [facility] INVASIVE CV LAB;  Service: Cardiovascular;  Laterality: N/A;   TEE WITHOUT CARDIOVERSION N/A 12/14/2022   Procedure: TRANSESOPHAGEAL ECHOCARDIOGRAM;  Surgeon: Rolan Ezra RAMAN, MD;  Location: [facility] INVASIVE CV LAB;  Service: Cardiovascular;  Laterality: N/A;   TRANSESOPHAGEAL ECHOCARDIOGRAM (CATH LAB) N/A 10/02/2023   Procedure: TRANSESOPHAGEAL ECHOCARDIOGRAM;  Surgeon: Rolan Ezra RAMAN, MD;  Location: [facility] INVASIVE CV LAB;  Service: Cardiovascular;  Laterality: N/A;   Social History   Tobacco Use   Smoking status: Former    Current packs/day: 0.00    Average packs/day: 4.0 packs/day for 18.0 years (72.0 ttl  pk-yrs)    Types: Cigarettes    Start date: 08/13/1965    Quit date: 08/14/1983    Years since quitting: 40.8   Smokeless tobacco: Never  Vaping Use   Vaping status: Never Used  Substance Use Topics   Alcohol use: Yes    Alcohol/week: 16.0 standard drinks of alcohol    Types: 6 Cans of beer, 10 Shots of liquor per week    Comment: 2-3 beers watching sports; occasional glass of liquor; drinks heavily in football season   Drug use: Yes    Types: Marijuana    Comment: twice a week   Family History  Problem Relation Age of Onset   Diabetes Mother    Hypertension Mother    Heart attack Neg Hx    Stroke Neg Hx    Allergies  Allergen Reactions   Shrimp [Shellfish Allergy] Nausea And Vomiting   Ace Inhibitors Cough   Other Hives and Other (See Comments)    Patient  reports developing hives after receiving some antibiotic given in 1980''s at [facility]. He does not know which antibiotic.    ROS Negative unless stated above    Objective:     BP (!) 152/84 (BP Location: Left Arm, Patient Position: Sitting, Cuff Size: Large)   Pulse (!) 44   Temp 97.7 F (36.5 C) (Oral)   Ht 6' (1.829 m)   Wt 194 lb 12.8 oz (88.4 kg)   SpO2 99%   BMI 26.42 kg/m  BP Readings from Last 3 Encounters:  06/24/24 (!) 152/84  06/22/24 (!) 154/89  06/15/24 (!) 163/90   Wt Readings from Last 3 Encounters:  06/24/24 194 lb 12.8 oz (88.4 kg)  06/22/24 187 lb 9.6 oz (85.1 kg)  06/15/24 187 lb 9.6 oz (85.1 kg)      Physical Exam Constitutional:      General: He is not in acute distress.    Appearance: Normal appearance.  HENT:     Head: Normocephalic and atraumatic.     Nose: Nose normal.     Mouth/Throat:     Mouth: Mucous membranes are moist.  Cardiovascular:     Rate and Rhythm: Regular rhythm. Bradycardia present.     Heart sounds: Normal heart sounds. No murmur heard.    No gallop.  Pulmonary:     Effort: Pulmonary effort is normal. No respiratory distress.     Breath  sounds: Normal breath sounds. No wheezing, rhonchi or rales.  Musculoskeletal:     Right lower leg: No edema.     Left lower leg: No edema.  Skin:    General: Skin is warm and dry.  Neurological:     Mental Status: He is alert and oriented to person, place, and time.        01/02/2024    9:54 AM 12/09/2023    5:02 PM 10/28/2023   12:16 PM  Depression screen PHQ 2/9  Decreased Interest 0 0 0  Down, Depressed, Hopeless 0 0 0  PHQ - 2 Score 0 0 0  Altered sleeping 1 1 1   Tired, decreased energy 1 1 1   Change in appetite 2 0 1  Feeling bad or failure about yourself  1 0 0  Trouble concentrating 1 0 0  Moving slowly or fidgety/restless 0 0 1  Suicidal thoughts 0 0 0  PHQ-9 Score 6  2  4    Difficult doing work/chores Not difficult at all Not difficult at all Not difficult at all     Data saved with a previous flowsheet row definition      01/02/2024    9:54 AM 12/09/2023    5:02 PM 10/28/2023   12:16 PM 09/05/2023   12:59 PM  GAD 7 : Generalized Anxiety Score  Nervous, Anxious, on Edge 1 0 0 0  Control/stop worrying 0 0 0 0  Worry too much - different things 1 0 0 2  Trouble relaxing 0 0 0 0  Restless 0 0 0 0  Easily annoyed or irritable 0 0 0 0  Afraid - awful might happen 1 0 1 0  Total GAD 7 Score 3 0 1 2  Anxiety Difficulty Not difficult at all Not difficult at all Not difficult at all Not difficult at all     No results found for any visits on 06/24/24.    Assessment & Plan:   Hospital discharge follow-up  Type 2 diabetes mellitus with hyperglycemia, with long-term current use of insulin  (HCC) -  Microalbumin / creatinine urine ratio  Essential hypertension -     Losartan  Potassium; Take 1 tablet (50 mg total) by mouth daily.  Dispense: 90 tablet; Refill: 3 -     Metoprolol  Succinate ER; Take 0.5 tablets (12.5 mg total) by mouth daily.  Dispense: 45 tablet; Refill: 3 -     Comprehensive metabolic panel with GFR; Future -     Magnesium ; Future  Chronic  diastolic CHF (congestive heart failure) (HCC) -     Metoprolol  Succinate ER; Take 0.5 tablets (12.5 mg total) by mouth daily.  Dispense: 45 tablet; Refill: 3  Persistent atrial fibrillation (HCC) -     Comprehensive metabolic panel with GFR; Future -     Magnesium ; Future  Need for influenza vaccination -     Flu vaccine HIGH DOSE PF(Fluzone Trivalent)  Bradycardia -     Comprehensive metabolic panel with GFR; Future -     Magnesium ; Future  Acute kidney injury  Cerebrovascular small vessel disease   TCM phone call from 06/09/2024 reviewed.  Hospital notes, labs, imaging, etc. reviewed.  Patient with continued elevation in BP since discharge though slightly improved.  Euvolemic on exam.  Echo with LVEF 55-60%, mild LVH.  Compliance seemed to be an issue.  Improving with help from daughters.continue using pillbox to help organize medications.  Given bradycardia will have patient take Toprol  XL 12.5 mg daily and increase losartan  from 25 to 50 mg daily.  Continue other BP medications including Norvasc  10 mg daily, Imdur  120 mg, eplerenone  25 mg daily and torsemide  40 mg as needed for weight gain greater than 2 pounds in 24 hours or greater than 5 pounds in 1 week, increased LE edema, increased SOB.  Monitor BP twice per day.  For continued elevation in BP but improvement in dizziness further increase losartan .  Continue follow-up with cardiology and neurology.  Discussed the importance of lifestyle modifications in addition to maximizing medical management for chronic conditions including CVD, AKI.  Continue Lipitor  80 mg daily.  Hemoglobin A1c 8.4% on 06/04/2024.  Blood sugar readings improving at home with diet modifications and medication compliance.  D/C metformin  due to intolerance.  Continue monitoring blood sugar and using sliding scale insulin  as needed.  Schedule follow-up with Endo.  Influenza vaccine given this visit.  Return in about 3 months (around 09/24/2024).   Clotilda JONELLE Single,  MD

## 2024-06-24 NOTE — Patient Instructions (Addendum)
 After a discussion with our pharmacist.  The Toprol XL 25 mg tab that you have at home can be broken in half as it has a score mark in the middle.  We will have you break the tab in half and take 12.5 mg once a day.  A new prescription was sent to the pharmacy reflecting the half a tablet dose.  A new prescription for losartan  was sent to the pharmacy.  The dose was increased from 25 mg to 50 mg daily.  Continue to monitor blood pressure.  You can stop the metformin .  Continue monitoring blood sugar and use sliding scale insulin  as needed.  Schedule follow-up with your endocrinologist.

## 2024-06-29 ENCOUNTER — Other Ambulatory Visit: Payer: Self-pay

## 2024-06-29 NOTE — Transitions of Care (Post Inpatient/ED Visit) (Signed)
 Transition of Care week 4  Visit Note  06/29/2024  Name: [name] [name] MRN: [id number]          DOB: 1955/10/19  Situation: Patient enrolled in [facility] 30-day program. Visit completed with daughter of patient [name] [name]) by telephone.   Background:   Initial Transition Care Management Follow-up Telephone Call Discharge Date and Diagnosis: 06/08/24, Hypertensive emergency without congestive heart failure   Past Medical History:  Diagnosis Date   Anxiety    CAD (coronary artery disease), native coronary artery    a. Nonobstructive CAD by cath 2013 - diffuse distal and branch vessel CAD, no severe disease in the major coronaries, LV mild global hypokinesis, EF 45%. b. ETT-Sestamibi 5/14: EF 31%, small fixed inferior defect with no ischemia.   Chronic CHF (HCC)    a. Mixed ICM/NICM (?EtOH). EF 35% in 2008. Echo 5/13: EF 60-65%, mod LVH, EF 45% on V gram in 12/2011. EF 12/2012: EF 50-55%, mild LVH, inferobasal HK, mild MR. ETT-Ses 5/14 EF 41%. Cardiac MRI 5/14: EF 44%, mild global HK, subepicardial delayed enhancement in nonspecific RV insertion pattern.   COLONIC POLYPS, HX OF 12/30/2006   Gout    H/O atrial flutter 08/13/2005   a. Ablations in 2007, 2008.   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 Reports PCP visit went well and PCP  made some medications changes. Daughter denies any concerns today. Reviewed importance of close CBG and BP monitoring since medication changes.  Patient Reported Symptoms: Cognitive Cognitive Status: Other: (phone call with daughter. Daughter reports normal mentation)      Neurological Neurological Review of Symptoms: Weakness Neurological Management Strategies: Medical device Neurological Self-Management Outcome: 5 (very good) Neurological Comment: continues to be active with PT and he is using his walker.  HEENT HEENT Symptoms Reported: Not assessed      Cardiovascular Cardiovascular Symptoms Reported: Other: Other Cardiovascular Symptoms: BP elevated and daughter is monitoring. No recent readings with daughter at time of call but she is monitoring. Does patient have uncontrolled Hypertension?: Yes Is patient checking Blood Pressure at home?: Yes Patient's Recent BP reading at home: 160/87   3 days ago Cardiovascular Management Strategies: Medication therapy, Medical device Weight: 186 lb 3.2 oz (84.5 kg) (home monitoring result from today)  Respiratory Respiratory Symptoms Reported: No symptoms reported    Endocrine Endocrine Symptoms Reported: No symptoms reported Is patient diabetic?: Yes Is patient checking blood sugars at home?: Yes List most recent blood sugar readings, include date and time of day: Currently during call 270. non fasting    Gastrointestinal Gastrointestinal Symptoms Reported: No symptoms reported      Genitourinary Genitourinary Symptoms Reported: No symptoms reported    Integumentary Integumentary Symptoms Reported: No symptoms reported  Musculoskeletal Musculoskelatal Symptoms Reviewed: Unsteady gait Additional Musculoskeletal Details: usin walker        Psychosocial Psychosocial Symptoms Reported: Not assessed         Today's Vitals   06/29/24 1154  BP: (!) 160/87  Weight: 186 lb 3.2 oz (84.5 kg)      Medications Reviewed Today      Reviewed by Rumalda Alan PENNER, RN (Registered Nurse) on 06/29/24 at 1141  Med List Status: <None>   Medication Order Taking? Sig Documenting Provider Last Dose Status Informant  acetaminophen  (TYLENOL ) 500 MG tablet [id number] Yes Take 1,000 mg by mouth every 6 (six) hours as needed (pain). [provider]  Active Self  albuterol  (VENTOLIN  HFA) 108 (90 Base) MCG/ACT inhaler [id number] Yes Inhale 1-2 puffs into the lungs every 6 (six) hours as needed for wheezing or shortness of breath. Babara Greig GAILS, PA-C  Active Self  amLODipine  (NORVASC ) 10 MG tablet [id number] Yes Take 1 tablet (10 mg total) by mouth daily. Al-Sultani, Anmar, MD  Active   atorvastatin  (LIPITOR ) 80 MG tablet [id number] Yes TAKE 1 TABLET BY MOUTH ONCE DAILY Bensimhon, Toribio SAUNDERS, MD  Active Self  Blood Glucose Monitoring Suppl DEVI [id number] Yes 1 each by Does not apply route in the morning, at noon, and at bedtime. May substitute to any manufacturer covered by patient's insurance. Mercer Clotilda SAUNDERS, MD  Active Self  Continuous Glucose Receiver (FREESTYLE LIBRE 3 READER) DEVI [id number] Yes 1 Device by Does not apply route continuous. Mercer Clotilda SAUNDERS, MD  Active Self  Continuous Glucose Sensor (FREESTYLE LIBRE 3 SENSOR) [place] [id number] Yes 1 Device by Does not apply route every 14 (fourteen) days. Dartha Ernst, MD  Active Self  ELIQUIS  5 MG TABS tablet [id number] Yes TAKE 1 TABLET BY MOUTH TWICE DAILY McLean, Dalton S, MD  Active Self           Med Note (SATTERFIELD, TEENA E   Wed Jun 03, 2024 10:07 PM) @1800   eplerenone  (INSPRA ) 25 MG tablet [id number] Yes Take 1 tablet (25 mg total) by mouth daily. Kaibab, Lynn, [place]  Active Self  ezetimibe  (ZETIA ) 10 MG tablet [id number] Yes TAKE 1 TABLET BY MOUTH ONCE DAILY McLean, Dalton S, MD  Active Self  folic acid  (FOLVITE ) 1 MG tablet [id number] Yes Take 1 tablet (1 mg total) by mouth daily. Al-Sultani, Anmar, MD  Active   Glucose Blood (BLOOD GLUCOSE TEST STRIPS) STRP [id number] Yes 1 each  by In Vitro route in the morning, at noon, and at bedtime. May substitute to any manufacturer covered by patient's insurance. Mercer Clotilda SAUNDERS, MD  Active Self  insulin  glargine (LANTUS  SOLOSTAR) 100 UNIT/ML Solostar Pen [id number] Yes Inject 20 Units into the skin at bedtime. Al-Sultani, Anmar, MD  Active   insulin  lispro (HUMALOG ) 100 UNIT/ML injection [id number] Yes Inject 1 Units into the skin 3 (three) times daily with meals. Sliding scale 70-140=0 Units 141-170=1 Units 171-200=2 Units 201-230=3 Units 231-260=4 Units 261-290=5 Units 291-320=6 Units 231-350=7 Units 351-380=8 Units Over 380 10 units [provider]  Active Self  Insulin  Pen Needle 32G X 4 MM MISC [id number] Yes Use as directed with Lantus . Al-Sultani, Anmar, MD  Active   isosorbide  mononitrate (IMDUR ) 120 MG 24 hr tablet [id number] Yes TAKE 1 TABLET BY MOUTH ONCE DAILY McLean, Dalton S, MD  Active Self  JARDIANCE  25 MG TABS tablet [id number] Yes TAKE 1 TABLET BY MOUTH ONCE DAILY CONTACT OFFICE FOR APPOINTMENT Dartha Ernst, MD  Active Self  Lancets Misc.  MISC [id number] Yes 1 each by Does not apply route in the morning, at noon, and at bedtime. May substitute to any manufacturer covered by patient's insurance. Mercer Clotilda SAUNDERS, MD  Active Self  losartan  (COZAAR ) 50 MG tablet [id number] Yes Take 1 tablet (50 mg total) by mouth daily. Mercer Clotilda SAUNDERS, MD  Active   metFORMIN  (GLUCOPHAGE ) 1000 MG tablet [id number]  TAKE 1 TABLET BY MOUTH ONCE DAILY WITH BREAKFAST *PATIENT NEEDS APPOINTMENT*  Patient not taking: Reported on 06/29/2024   Motwani, Komal, MD  Active   metoprolol succinate (TOPROL-XL) 25 MG 24 hr tablet [id number] Yes Take 0.5 tablets (12.5 mg total) by mouth daily. Mercer Clotilda SAUNDERS, MD  Active   Multiple Vitamin (MULTIVITAMIN WITH MINERALS) TABS tablet [id number] Yes Take 1 tablet by mouth daily. Al-Sultani, Anmar, MD  Active   nitroGLYCERIN  (NITROSTAT ) 0.4 MG SL tablet [id number]  DISSOLVE 1 TABLET UNDER THE  TONGUE AS NEEDED FOR CHEST PAIN EVERY 5 MINUTES UP TO 3 TIMES. IF NO RELIEF CALL 911.  Patient not taking: Reported on 06/29/2024   Bensimhon, Daniel R, MD  Active   polyethylene glycol powder [facility]) 17 GM/SCOOP powder [id number]  Dissolve 1 capful (17g) in 4-8 ounces of liquid and take by mouth daily as needed for mild constipation.  Patient not taking: Reported on 06/29/2024   Al-Sultani, Anmar, MD  Active   thiamine  (VITAMIN B1) 100 MG tablet [id number] Yes Take 1 tablet (100 mg total) by mouth daily. Al-Sultani, Anmar, MD  Active   torsemide  (DEMADEX ) 20 MG tablet [id number] Yes Take 2 tablets (40 mg total) by mouth daily as needed (as needed for weight gain >2 lbs in 24 hrs or > 5 lbs in a week , increased leg swelling, increased shortness of breath. May take another dose if symptoms persist and contact provider). Al-Sultani, Anmar, MD  Active             Goals Addressed             This Visit's Progress    [facility] Transitions of Care (TOC) Care Plan       Problems:  Recent Hospitalization for treatment of admission for hypertensive crisis, ischemic stroke  06/15/2024 daughter reports BP 160/90.    Weight is stable.  No swelling,  Daughter reports getting up and down difficulty.  Vision problems and neuropathy. 06/22/2024  Daughter reports no weight gain and no swelling. 06/29/2024  Daughter reports BP medications changes per MD.  CBG today of 270 non fasting.  Medication management barrier does not have Losartan .   06/15/2024  Patient is taking losartan .   06/22/2024 Daughter reports patient is taking all medications as prescribed. Has not had to take any insulin  since hospital discharge. CBG under good control per daughter. 06/29/2024   Increase in Losartan  per PCP. Daughter reports patient is taking medications as prescribed.  No Hospital Follow Up Provider appointment.   06/15/2024  Has an appointment with Dr. Carlie. 06/22/2024 Hospital follow up was cancelled by MD office and  is rescheduled for this week. 06/29/2024  Hospital follow up completed.  Pending delivery of rollator by [employer],  06/11/2024  Call back from daughter who states they have not received rollator.  06/15/2024 daughter reports that they still have not gotten rollator. 06/22/2024 Daughter confirms patient got rollator and is using it well.  Pending start of home health  06/11/2024  Call back from daughter who states they have not heard from [facility]. 06/15/2024  daughter confirms patient has started  his PT.   06/22/2024  Daughter confirms that patient remain active with PT twice a week. 06/29/2024  Continues to use walker and remains active with PT Diarrhea - daughter thinks related to Metformin   06/15/2024  Daughter reports diarrhea has stopped 06/22/2024 daughter believes patient is having diarrhea because he goes straight to the bathroom after eating. 06/29/2024  Metformin  discontinued.  BP elevated:  06/15/2024  Reviewed home BP readings and BP is up and ranging 160/90.  06/22/2024 BP remains elevated and daughter to discuss with PCP and reviewed home reading at OV this week.  Patient is taking all medications as prescribed. 06/29/2024  Daughter unable to provide lastest BP reading. Last reading she has with her at time of call was 160/87 on 06/26/2024   Goal:  Over the next 30 days, the patient will not experience hospital readmission  Interventions:  Transitions of Care: Doctor Visits  - discussed the importance of doctor visits Confirmed that patient is active with PT. Reviewed recent CBG reading- reading today of 270.  Daughter reports has given SSI over this last weekend.  Confirmed that patient has his medications and is taking them as prescribed, reviewed medication changes from PCP Reviewed importance of daily BP checks twice a day and recording. Next telephone call scheduled Reviewed current plan is for patient to remain with his 3 daughters at this time.  Daughter reports patient is doing  well. SABRA    Heart Failure Interventions: Discussed the importance of keeping all appointments with provider Reviewed weights.- 1 episode of weight gain and patient took prn torsemide  and weight returned to normal.   Hypertension Interventions: Last practice recorded BP readings:  BP Readings from Last 3 Encounters:  06/22/24 (!) 154/89  06/15/24 (!) 163/90  06/09/24 (!) 154/71    Evaluation of current treatment plan related to hypertension self management and patient's adherence to plan as established by provider Provided education to patient re: stroke prevention, s/s of heart attack and stroke Reviewed new medication changes with daughter and discussed importance of compliance Discussed plans with patient for ongoing care management follow up and provided patient with direct contact information for care management team Advised patient, providing education and rationale, to monitor blood pressure daily and record, calling PCP for findings outside established parameters.  Reviewed this again and discussed with daughter recent BP readings are too high. Confirmed patient is taking his medications as prescribed.  Reviewed scheduled/upcoming provider appointments including: PCP Provided education on prescribed diet low sodium heart health diet Reviewed and encouraged BP home monitoring twice a day and taking LOG to MD appointments. Reviewed current BP and weights Stroke: Reviewed Importance of taking all medications as prescribed Reviewed Importance of attending all scheduled provider appointments Assessed for signs and symptoms of stroke Reviewed when to call 911.   Patient Self Care Activities:  Attend all scheduled provider appointments Call pharmacy for medication refills 3-7 days in advance of running out of medications Call provider office for new concerns or questions  Notify RN Care Manager of TOC call rescheduling needs Participate in Transition of Care Program/Attend TOC  scheduled calls Take medications as prescribed   call office if I gain more than 2 pounds in one day or 5 pounds in one week do ankle pumps when sitting track weight in diary use salt in moderation watch for swelling in feet, ankles and legs every day weigh myself daily track symptoms and what helps feel better or worse check blood pressure daily write blood pressure results  in a log or diary keep a blood pressure log take blood pressure log to all doctor appointments call doctor for signs and symptoms of high blood pressure keep all doctor appointments take medications for blood pressure exactly as prescribed report new symptoms to your doctor Call 911 for any signs of stroke  Seek medication attention after any fall. Continue to monitor CBG and BP since medications changes. For any concerns for abnormal readings -call PCP   Plan:  Telephone follow up appointment with care management team member scheduled for:  07/06/2024 at 1130 am The patient has been provided with contact information for the care management team and has been advised to call with any health related questions or concerns.          Recommendation:   Continue Current Plan of Care  Follow Up Plan:   Telephone follow up appointment date/time:  07/06/2024 at 1130 am  Alan Ee, RN, BSN, [employer]- Transition of Care Team.  [facility] [phone]

## 2024-06-29 NOTE — Patient Instructions (Signed)
 Visit Information  Thank you for taking time to visit with me today. Please don't hesitate to contact me if I can be of assistance to you before our next scheduled telephone appointment.  Following are the goals we discussed today:   Goals Addressed             This Visit's Progress    [facility] Transitions of Care (TOC) Care Plan       Problems:  Recent Hospitalization for treatment of admission for hypertensive crisis, ischemic stroke  06/15/2024 daughter reports BP 160/90.    Weight is stable.  No swelling,  Daughter reports getting up and down difficulty.  Vision problems and neuropathy. 06/22/2024  Daughter reports no weight gain and no swelling. 06/29/2024  Daughter reports BP medications changes per MD.  CBG today of 270 non fasting.  Medication management barrier does not have Losartan .   06/15/2024  Patient is taking losartan .   06/22/2024 Daughter reports patient is taking all medications as prescribed. Has not had to take any insulin  since hospital discharge. CBG under good control per daughter. 06/29/2024   Increase in Losartan  per PCP. Daughter reports patient is taking medications as prescribed.  No Hospital Follow Up Provider appointment.   06/15/2024  Has an appointment with Dr. Carlie. 06/22/2024 Hospital follow up was cancelled by MD office and is rescheduled for this week. 06/29/2024  Hospital follow up completed.  Pending delivery of rollator by [employer],  06/11/2024  Call back from daughter who states they have not received rollator.  06/15/2024 daughter reports that they still have not gotten rollator. 06/22/2024 Daughter confirms patient got rollator and is using it well.  Pending start of home health  06/11/2024  Call back from daughter who states they have not heard from [facility]. 06/15/2024  daughter confirms patient has started his PT.   06/22/2024  Daughter confirms that patient remain active with PT twice a week. 06/29/2024  Continues to use walker and remains active with  PT Diarrhea - daughter thinks related to Metformin   06/15/2024  Daughter reports diarrhea has stopped 06/22/2024 daughter believes patient is having diarrhea because he goes straight to the bathroom after eating. 06/29/2024  Metformin  discontinued.  BP elevated:  06/15/2024  Reviewed home BP readings and BP is up and ranging 160/90.  06/22/2024 BP remains elevated and daughter to discuss with PCP and reviewed home reading at OV this week.  Patient is taking all medications as prescribed. 06/29/2024  Daughter unable to provide lastest BP reading. Last reading she has with her at time of call was 160/87 on 06/26/2024   Goal:  Over the next 30 days, the patient will not experience hospital readmission  Interventions:  Transitions of Care: Doctor Visits  - discussed the importance of doctor visits Confirmed that patient is active with PT. Reviewed recent CBG reading- reading today of 270.  Daughter reports has given SSI over this last weekend.  Confirmed that patient has his medications and is taking them as prescribed, reviewed medication changes from PCP Reviewed importance of daily BP checks twice a day and recording. Next telephone call scheduled Reviewed current plan is for patient to remain with his 3 daughters at this time.  Daughter reports patient is doing well. SABRA    Heart Failure Interventions: Discussed the importance of keeping all appointments with provider Reviewed weights.- 1 episode of weight gain and patient took prn torsemide  and weight returned to normal.   Hypertension Interventions: Last practice recorded BP readings:  BP Readings  from Last 3 Encounters:  06/22/24 (!) 154/89  06/15/24 (!) 163/90  06/09/24 (!) 154/71    Evaluation of current treatment plan related to hypertension self management and patient's adherence to plan as established by provider Provided education to patient re: stroke prevention, s/s of heart attack and stroke Reviewed new medication changes  with daughter and discussed importance of compliance Discussed plans with patient for ongoing care management follow up and provided patient with direct contact information for care management team Advised patient, providing education and rationale, to monitor blood pressure daily and record, calling PCP for findings outside established parameters.  Reviewed this again and discussed with daughter recent BP readings are too high. Confirmed patient is taking his medications as prescribed.  Reviewed scheduled/upcoming provider appointments including: PCP Provided education on prescribed diet low sodium heart health diet Reviewed and encouraged BP home monitoring twice a day and taking LOG to MD appointments. Reviewed current BP and weights Stroke: Reviewed Importance of taking all medications as prescribed Reviewed Importance of attending all scheduled provider appointments Assessed for signs and symptoms of stroke Reviewed when to call 911.   Patient Self Care Activities:  Attend all scheduled provider appointments Call pharmacy for medication refills 3-7 days in advance of running out of medications Call provider office for new concerns or questions  Notify RN Care Manager of TOC call rescheduling needs Participate in Transition of Care Program/Attend TOC scheduled calls Take medications as prescribed   call office if I gain more than 2 pounds in one day or 5 pounds in one week do ankle pumps when sitting track weight in diary use salt in moderation watch for swelling in feet, ankles and legs every day weigh myself daily track symptoms and what helps feel better or worse check blood pressure daily write blood pressure results in a log or diary keep a blood pressure log take blood pressure log to all doctor appointments call doctor for signs and symptoms of high blood pressure keep all doctor appointments take medications for blood pressure exactly as prescribed report new symptoms to  your doctor Call 911 for any signs of stroke  Seek medication attention after any fall. Continue to monitor CBG and BP since medications changes. For any concerns for abnormal readings -call PCP   Plan:  Telephone follow up appointment with care management team member scheduled for:  07/06/2024 at 1130 am The patient has been provided with contact information for the care management team and has been advised to call with any health related questions or concerns.          Our next appointment is by telephone on 07/06/2024 at 1130  Please call the care guide team at [phone] if you need to cancel or reschedule your appointment.   If you are experiencing a Mental Health or Behavioral Health Crisis or need someone to talk to, please call the Suicide and Crisis Lifeline: 988 call the [place]  National Suicide Prevention Lifeline: 727-416-8014 or TTY: (510) 085-4432 TTY 714 573 4398) to talk to a trained counselor call 1-800-273-TALK (toll free, 24 hour hotline) call 911   Care plan and visit instructions communicated with the patient verbally today. Patient agrees to receive a copy in MyChart. Active MyChart status and patient understanding of how to access instructions and care plan via MyChart confirmed with patient.     Alan Ee, RN, BSN, CEN [employer]- Transition of Care Team.  [facility] [phone]

## 2024-07-01 ENCOUNTER — Encounter: Payer: Self-pay | Admitting: "Endocrinology

## 2024-07-01 ENCOUNTER — Ambulatory Visit (INDEPENDENT_AMBULATORY_CARE_PROVIDER_SITE_OTHER): Admitting: "Endocrinology

## 2024-07-01 VITALS — BP 120/70 | HR 74 | Ht 72.0 in | Wt 194.0 lb

## 2024-07-01 DIAGNOSIS — E1165 Type 2 diabetes mellitus with hyperglycemia: Secondary | ICD-10-CM

## 2024-07-01 DIAGNOSIS — Z794 Long term (current) use of insulin: Secondary | ICD-10-CM

## 2024-07-01 NOTE — Patient Instructions (Signed)

## 2024-07-01 NOTE — Progress Notes (Signed)
 Outpatient Endocrinology Note [name] Birmingham, MD  07/01/24   [name] [name] 02/15/1956 [id number]  Referring Provider: Mercer Clotilda SAUNDERS, MD Primary Care Provider: Mercer Clotilda SAUNDERS, MD Reason for consultation: Subjective   Assessment & Plan  Diagnoses and all orders for this visit:  Uncontrolled type 2 diabetes mellitus with hyperglycemia (HCC)  Long-term insulin  use (HCC)  Insulin  dose changed (HCC)  Treatments as long as somebody is happy in this room and Diabetes complicated by hyperglycemia,  Hba1c goal less than 7.0, current Hba1c is  Lab Results  Component Value Date   HGBA1C 8.4 (H) 06/04/2024   HGBA1C 9.8 (A) 10/28/2023   HGBA1C 10.3 (A) 01/16/2023   Will recommend to continue: Jardiance  25 mg daily Stopped metformin  1 g daily (low GFR currently) Lantus  20 units at bedtime Humalog  sliding scale 15 min before meals (patient did not bring the scale) (I recommend today to take 1 extra unit with break fast)  No known contraindications to any of above medications Pt plans to make appt with eye and foot doctor  Hyperlipidemia -Last LDL near goal: 159 -on atorvastatin  80 mg QD -Follow low fat diet and exercise   -Blood pressure goal <140/90 - Microalbumin/creatinine at goal < 30 - not on ACE/ARB  -diet changes including salt restriction -limit eating outside -counseled BP targets per standards of diabetes care -Uncontrolled blood pressure can lead to retinopathy, nephropathy and cardiovascular and atherosclerotic heart disease  Reviewed and counseled on: -A1C target -Blood sugar targets -Complications of uncontrolled diabetes  -Checking blood sugar before meals and bedtime and bring log next visit -All medications with mechanism of action and side effects -Hypoglycemia management: rule of 15's, Glucagon Emergency Kit and medical alert ID -low-carb low-fat plate-method diet -At least 20 minutes of physical activity per day -Annual dilated  retinal eye exam and foot exam -compliance and follow up needs -follow up as scheduled or earlier if problem gets worse  Call if blood sugar is less than 70 or consistently above 250    Take a 15 gm snack of carbohydrate at bedtime before you go to sleep if your blood sugar is less than 100.    If you are going to fast after midnight for a test or procedure, ask your physician for instructions on how to reduce/decrease your insulin  dose.    Call if blood sugar is less than 70 or consistently above 250  -Treating a low sugar by rule of 15  (15 gms of sugar every 15 min until sugar is more than 70) If you feel your sugar is low, test your sugar to be sure If your sugar is low (less than 70), then take 15 grams of a fast acting Carbohydrate (3-4 glucose tablets or glucose gel or 4 ounces of juice or regular soda) Recheck your sugar 15 min after treating low to make sure it is more than 70 If sugar is still less than 70, treat again with 15 grams of carbohydrate          Don't drive the hour of hypoglycemia  If unconscious/unable to eat or drink by mouth, use glucagon injection or nasal spray baqsimi and call 911. Can repeat again in 15 min if still unconscious.  Return in about 6 months (around 12/29/2024).   I have reviewed current medications, nurse's notes, allergies, vital signs, past medical and surgical history, family medical history, and social history for this encounter. Counseled patient on symptoms, examination findings, lab findings, imaging results, treatment  decisions and monitoring and prognosis. The patient understood the recommendations and agrees with the treatment plan. All questions regarding treatment plan were fully answered.  [name] Birmingham, MD  07/01/24    History of Present Illness [name] [name] is a 68 y.o. year old male who presents for follow up of Type 2 diabetes mellitus.  [name] [name] was first diagnosed in 2010.   Diabetes education  +  Home diabetes regimen: Non-insulin  hypoglycemic drugs: Jardiance  25 mg daily Insulin  regimen: Lantus  20 units qhs, Humalog  20 units at break fast 15 min before meals  Was asked to stop metformin  1 g daily  Previous history:  Non-insulin  hypoglycemic drugs previously used: Metformin , Jardiance  Insulin  was started in 2013  COMPLICATIONS -  MI/Stroke -  retinopathy +  neuropathy +  nephropathy  BLOOD SUGAR DATA CGM interpretation: At today's visit, we reviewed her CGM downloads. The full report is scanned in the media. Reviewing the CGM trends, BG are  mildly elevated in the morning time after breakfast and improve thereafter, 26% highs overall.  Physical Exam  BP 120/70   Pulse 74   Ht 6' (1.829 m)   Wt 194 lb (88 kg)   SpO2 97%   BMI 26.31 kg/m    Constitutional: well developed, well nourished Head: normocephalic, atraumatic Eyes: sclera anicteric, no redness Neck: supple Lungs: normal respiratory effort Neurology: alert and oriented Skin: dry, no appreciable rashes Musculoskeletal: no appreciable defects Psychiatric: normal mood and affect Diabetic Foot Exam - Simple   Simple Foot Form Diabetic Foot exam was performed with the following findings: Yes 07/01/2024  1:49 PM  Visual Inspection No deformities, no ulcerations, no other skin breakdown bilaterally: Yes Sensation Testing Intact to touch and monofilament testing bilaterally: Yes Pulse Check Posterior Tibialis and Dorsalis pulse intact bilaterally: Yes Comments      Current Medications Patient's Medications  New Prescriptions   No medications on file  Previous Medications   ACETAMINOPHEN  (TYLENOL ) 500 MG TABLET    Take 1,000 mg by mouth every 6 (six) hours as needed (pain).   ALBUTEROL  (VENTOLIN  HFA) 108 (90 BASE) MCG/ACT INHALER    Inhale 1-2 puffs into the lungs every 6 (six) hours as needed for wheezing or shortness of breath.   AMLODIPINE  (NORVASC ) 10 MG TABLET    Take 1 tablet (10 mg total) by  mouth daily.   ATORVASTATIN  (LIPITOR ) 80 MG TABLET    TAKE 1 TABLET BY MOUTH ONCE DAILY   BLOOD GLUCOSE MONITORING SUPPL DEVI    1 each by Does not apply route in the morning, at noon, and at bedtime. May substitute to any manufacturer covered by patient's insurance.   CONTINUOUS GLUCOSE RECEIVER (FREESTYLE LIBRE 3 READER) DEVI    1 Device by Does not apply route continuous.   CONTINUOUS GLUCOSE SENSOR (FREESTYLE LIBRE 3 SENSOR) MISC    1 Device by Does not apply route every 14 (fourteen) days.   ELIQUIS  5 MG TABS TABLET    TAKE 1 TABLET BY MOUTH TWICE DAILY   EPLERENONE  (INSPRA ) 25 MG TABLET    Take 1 tablet (25 mg total) by mouth daily.   EZETIMIBE  (ZETIA ) 10 MG TABLET    TAKE 1 TABLET BY MOUTH ONCE DAILY   FOLIC ACID  (FOLVITE ) 1 MG TABLET    Take 1 tablet (1 mg total) by mouth daily.   GLUCOSE BLOOD (BLOOD GLUCOSE TEST STRIPS) STRP    1 each by In Vitro route in the morning, at noon, and at bedtime.  May substitute to any manufacturer covered by patient's insurance.   INSULIN  GLARGINE (LANTUS  SOLOSTAR) 100 UNIT/ML SOLOSTAR PEN    Inject 20 Units into the skin at bedtime.   INSULIN  LISPRO (HUMALOG ) 100 UNIT/ML INJECTION    Inject 1 Units into the skin 3 (three) times daily with meals. Sliding scale 70-140=0 Units 141-170=1 Units 171-200=2 Units 201-230=3 Units 231-260=4 Units 261-290=5 Units 291-320=6 Units 231-350=7 Units 351-380=8 Units Over 380 10 units   INSULIN  PEN NEEDLE 32G X 4 MM MISC    Use as directed with Lantus .   ISOSORBIDE  MONONITRATE (IMDUR ) 120 MG 24 HR TABLET    TAKE 1 TABLET BY MOUTH ONCE DAILY   JARDIANCE  25 MG TABS TABLET    TAKE 1 TABLET BY MOUTH ONCE DAILY CONTACT OFFICE FOR APPOINTMENT   LANCETS MISC. MISC    1 each by Does not apply route in the morning, at noon, and at bedtime. May substitute to any manufacturer covered by patient's insurance.   LOSARTAN  (COZAAR ) 50 MG TABLET    Take 1 tablet (50 mg total) by mouth daily.   METFORMIN  (GLUCOPHAGE ) 1000 MG TABLET     TAKE 1 TABLET BY MOUTH ONCE DAILY WITH BREAKFAST *PATIENT NEEDS APPOINTMENT*   METOPROLOL SUCCINATE (TOPROL-XL) 25 MG 24 HR TABLET    Take 0.5 tablets (12.5 mg total) by mouth daily.   MULTIPLE VITAMIN (MULTIVITAMIN WITH MINERALS) TABS TABLET    Take 1 tablet by mouth daily.   NITROGLYCERIN  (NITROSTAT ) 0.4 MG SL TABLET    DISSOLVE 1 TABLET UNDER THE TONGUE AS NEEDED FOR CHEST PAIN EVERY 5 MINUTES UP TO 3 TIMES. IF NO RELIEF CALL 911.   POLYETHYLENE GLYCOL POWDER (GLYCOLAX/MIRALAX) 17 GM/SCOOP POWDER    Dissolve 1 capful (17g) in 4-8 ounces of liquid and take by mouth daily as needed for mild constipation.   THIAMINE  (VITAMIN B1) 100 MG TABLET    Take 1 tablet (100 mg total) by mouth daily.   TORSEMIDE  (DEMADEX ) 20 MG TABLET    Take 2 tablets (40 mg total) by mouth daily as needed (as needed for weight gain >2 lbs in 24 hrs or > 5 lbs in a week , increased leg swelling, increased shortness of breath. May take another dose if symptoms persist and contact provider).  Modified Medications   No medications on file  Discontinued Medications   No medications on file    Allergies Allergies  Allergen Reactions   Shrimp [Shellfish Allergy] Nausea And Vomiting   Ace Inhibitors Cough   Other Hives and Other (See Comments)    Patient reports developing hives after receiving some antibiotic given in 1980''s at [facility]. He does not know which antibiotic.    Past Medical History Past Medical History:  Diagnosis Date   Anxiety    CAD (coronary artery disease), native coronary artery    a. Nonobstructive CAD by cath 2013 - diffuse distal and branch vessel CAD, no severe disease in the major coronaries, LV mild global hypokinesis, EF 45%. b. ETT-Sestamibi 5/14: EF 31%, small fixed inferior defect with no ischemia.   Chronic CHF (HCC)    a. Mixed ICM/NICM (?EtOH). EF 35% in 2008. Echo 5/13: EF 60-65%, mod LVH, EF 45% on V gram in 12/2011. EF 12/2012: EF 50-55%, mild LVH, inferobasal HK, mild  MR. ETT-Ses 5/14 EF 41%. Cardiac MRI 5/14: EF 44%, mild global HK, subepicardial delayed enhancement in nonspecific RV insertion pattern.   COLONIC POLYPS, HX OF 12/30/2006   Gout  H/O atrial flutter 08/13/2005   a. Ablations in 2007, 2008.   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   PAF (paroxysmal atrial fibrillation) (HCC)    On coumadin    Sleep apnea    suppose to send mask but they never did (05/03/2015)   Type II diabetes mellitus [facility])     Past Surgical History Past Surgical History:  Procedure Laterality Date   A FLUTTER ABLATION  2007, 2008   catheter ablation    CARDIAC CATHETERIZATION N/A 05/03/2015   Procedure: Right/Left Heart Cath and Coronary Angiography;  Surgeon: Ezra GORMAN Shuck, MD;  Location: [facility] INVASIVE CV LAB;  Service: Cardiovascular;  Laterality: N/A;   CARDIAC CATHETERIZATION N/A 05/03/2015   Procedure: Coronary Stent Intervention;  Surgeon: Candyce GORMAN Reek, MD;  Location: [facility] INVASIVE CV LAB;  Service: Cardiovascular;  Laterality: N/A;   CARDIAC CATHETERIZATION N/A 11/04/2015   Procedure: Left Heart Cath and Coronary Angiography;  Surgeon: Ezra GORMAN Shuck, MD;  Location: [facility] INVASIVE CV LAB;  Service: Cardiovascular;  Laterality: N/A;   CARDIOVERSION N/A 08/24/2015   Procedure: CARDIOVERSION;  Surgeon: Aleene JINNY Passe, MD;  Location: [facility] ENDOSCOPY;  Service: Cardiovascular;  Laterality: N/A;   CARDIOVERSION N/A 12/14/2022   Procedure: CARDIOVERSION;  Surgeon: Shuck Ezra GORMAN, MD;  Location: [facility] INVASIVE CV LAB;  Service: Cardiovascular;  Laterality: N/A;   CARDIOVERSION N/A 10/02/2023   Procedure: CARDIOVERSION;   Surgeon: Shuck Ezra GORMAN, MD;  Location: [facility] INVASIVE CV LAB;  Service: Cardiovascular;  Laterality: N/A;   CHOLECYSTECTOMY N/A 02/07/2016   Procedure: LAPAROSCOPIC CHOLECYSTECTOMY WITH  INTRAOPERATIVE CHOLANGIOGRAM;  Surgeon: Camellia Blush, MD;  Location: [facility] ORS;  Service: General;  Laterality: N/A;   COLONOSCOPY WITH PROPOFOL  N/A 04/28/2019   Procedure: COLONOSCOPY WITH PROPOFOL ;  Surgeon: Saintclair Jasper, MD;  Location: [facility] ENDOSCOPY;  Service: Gastroenterology;  Laterality: N/A;   CORONARY ANGIOPLASTY  12/05/01   ESOPHAGOGASTRODUODENOSCOPY (EGD) WITH PROPOFOL  N/A 04/27/2019   Procedure: ESOPHAGOGASTRODUODENOSCOPY (EGD) WITH PROPOFOL ;  Surgeon: Saintclair Jasper, MD;  Location: [facility] ENDOSCOPY;  Service: Gastroenterology;  Laterality: N/A;   GIVENS CAPSULE STUDY N/A 06/29/2019   Procedure: GIVENS CAPSULE STUDY;  Surgeon: Dianna Specking, MD;  Location: [facility] ENDOSCOPY;  Service: Endoscopy;  Laterality: N/A;   LEFT HEART CATH AND CORONARY ANGIOGRAPHY N/A 12/28/2016   Procedure: Left Heart Cath and Coronary Angiography;  Surgeon: Shuck Ezra GORMAN, MD;  Location: [facility] INVASIVE CV LAB;  Service: Cardiovascular;  Laterality: N/A;   LEFT HEART CATHETERIZATION WITH CORONARY ANGIOGRAM N/A 12/19/2011   Procedure: LEFT HEART CATHETERIZATION WITH CORONARY ANGIOGRAM;  Surgeon: Ezra GORMAN Shuck, MD;  Location: [facility] CATH LAB;  Service: Cardiovascular;  Laterality: N/A;   OSTEOCHONDROMA EXCISION Left 1972   took bone tumor off my shoulder   POLYPECTOMY  04/28/2019   Procedure: POLYPECTOMY;  Surgeon: Saintclair Jasper, MD;  Location: [facility] ENDOSCOPY;  Service: Gastroenterology;;   PRESSURE SENSOR/CARDIOMEMS N/A 02/01/2020   Procedure: PRESSURE SENSOR/CARDIOMEMS;  Surgeon: Shuck Ezra GORMAN, MD;  Location: [facility] INVASIVE CV LAB;  Service: Cardiovascular;  Laterality: N/A;   RIGHT HEART CATH N/A 02/01/2020   Procedure: RIGHT HEART CATH;  Surgeon: Shuck Ezra GORMAN, MD;  Location:  [facility] INVASIVE CV LAB;  Service: Cardiovascular;  Laterality: N/A;    RIGHT/LEFT HEART CATH AND CORONARY ANGIOGRAPHY N/A 07/01/2019   Procedure: RIGHT/LEFT HEART CATH AND CORONARY ANGIOGRAPHY;  Surgeon: Rolan Ezra RAMAN, MD;  Location: [facility] INVASIVE CV LAB;  Service: Cardiovascular;  Laterality: N/A;   TEE WITHOUT CARDIOVERSION N/A 12/14/2022   Procedure: TRANSESOPHAGEAL ECHOCARDIOGRAM;  Surgeon: Rolan Ezra RAMAN, MD;  Location: [facility] INVASIVE CV LAB;  Service: Cardiovascular;  Laterality: N/A;   TRANSESOPHAGEAL ECHOCARDIOGRAM (CATH LAB) N/A 10/02/2023   Procedure: TRANSESOPHAGEAL ECHOCARDIOGRAM;  Surgeon: Rolan Ezra RAMAN, MD;  Location: [facility] INVASIVE CV LAB;  Service: Cardiovascular;  Laterality: N/A;    Family History family history includes Diabetes in his mother; Hypertension in his mother.  Social History Social History   Socioeconomic History   Marital status: Widowed    Spouse name: Not on file   Number of children: 2   Years of education: 14   Highest education level: Some college, no degree  Occupational History    Employer: UNEMPLOYED  Tobacco Use   Smoking status: Former    Current packs/day: 0.00    Average packs/day: 4.0 packs/day for 18.0 years (72.0 ttl pk-yrs)    Types: Cigarettes    Start date: 08/13/1965    Quit date: 08/14/1983    Years since quitting: 40.9   Smokeless tobacco: Never  Vaping Use   Vaping status: Never Used  Substance and Sexual Activity   Alcohol use: Yes    Alcohol/week: 16.0 standard drinks of alcohol    Types: 6 Cans of beer, 10 Shots of liquor per week    Comment: 2-3 beers watching sports; occasional glass of liquor; drinks heavily in football season   Drug use: Yes    Types: Marijuana    Comment: twice a week   Sexual activity: Never  Other Topics Concern   Not on file  Social History Narrative   Not on file   Social Drivers of Health   Financial Resource Strain: Low Risk  (07/23/2022)   Overall Financial Resource Strain (CARDIA)    Difficulty of Paying Living Expenses: Not hard at all  Food Insecurity:  Unknown (06/09/2024)   Hunger Vital Sign    Worried About Running Out of Food in the Last Year: Never true    Ran Out of Food in the Last Year: Not on file  Transportation Needs: No Transportation Needs (06/09/2024)   PRAPARE - Administrator, Civil Service (Medical): No    Lack of Transportation (Non-Medical): No  Physical Activity: Inactive (07/23/2022)   Exercise Vital Sign    Days of Exercise per Week: 0 days    Minutes of Exercise per Session: 0 min  Stress: No Stress Concern Present (07/23/2022)   [employer] of Occupational Health - Occupational Stress Questionnaire    Feeling of Stress : Not at all  Social Connections: Socially Isolated (06/03/2024)   Social Connection and Isolation Panel    Frequency of Communication with Friends and Family: More than three times a week    Frequency of Social Gatherings with Friends and Family: Three times a week    Attends Religious Services: Never    Active Member of Clubs or Organizations: No    Attends Banker Meetings: Never    Marital Status: Widowed  Intimate Partner Violence: Not At Risk (06/03/2024)   Humiliation, Afraid, Rape, and Kick questionnaire    Fear of Current or Ex-Partner: No    Emotionally Abused: No  Physically Abused: No    Sexually Abused: No    Lab Results  Component Value Date   HGBA1C 8.4 (H) 06/04/2024   HGBA1C 9.8 (A) 10/28/2023   HGBA1C 10.3 (A) 01/16/2023   Lab Results  Component Value Date   CHOL 123 06/07/2024   Lab Results  Component Value Date   HDL 40 (L) 06/07/2024   Lab Results  Component Value Date   LDLCALC 65 06/07/2024   Lab Results  Component Value Date   TRIG 88 06/07/2024   Lab Results  Component Value Date   CHOLHDL 3.1 06/07/2024   Lab Results  Component Value Date   CREATININE 1.32 06/24/2024   Lab Results  Component Value Date   GFR 54.76 (L) 06/24/2024   Lab Results  Component Value Date   MICROALBUR 36.7 (H) 06/24/2024       Component Value Date/Time   NA 140 06/24/2024 1441   NA 140 04/08/2018 1102   K 4.5 06/24/2024 1441   CL 104 06/24/2024 1441   CO2 27 06/24/2024 1441   GLUCOSE 84 06/24/2024 1441   BUN 21 06/24/2024 1441   BUN 9 04/08/2018 1102   CREATININE 1.32 06/24/2024 1441   CREATININE 1.34 (H) 07/20/2020 1416   CALCIUM  9.8 06/24/2024 1441   PROT 7.3 06/24/2024 1441   PROT 7.1 04/08/2018 1102   ALBUMIN 4.0 06/24/2024 1441   ALBUMIN 3.9 04/08/2018 1102   AST 22 06/24/2024 1441   ALT 20 06/24/2024 1441   ALKPHOS 57 06/24/2024 1441   BILITOT 0.6 06/24/2024 1441   BILITOT 0.4 04/08/2018 1102   GFRNONAA 44 (L) 06/08/2024 0339   GFRNONAA 55 (L) 07/20/2020 1416   GFRAA 64 07/20/2020 1416      Latest Ref Rng & Units 06/24/2024    2:41 PM 06/08/2024    3:39 AM 06/07/2024    4:18 AM  BMP  Glucose 70 - 99 mg/dL 84  843  889   BUN 6 - 23 mg/dL 21  14  16    Creatinine 0.40 - 1.50 mg/dL 8.67  8.33  8.54   Sodium 135 - 145 mEq/L 140  137  137   Potassium 3.5 - 5.1 mEq/L 4.5  4.1  3.7   Chloride 96 - 112 mEq/L 104  103  105   CO2 19 - 32 mEq/L 27  25  21    Calcium  8.4 - 10.5 mg/dL 9.8  8.8  8.9        Component Value Date/Time   WBC 6.5 06/08/2024 0339   RBC 4.93 06/08/2024 0339   HGB 14.4 06/08/2024 0339   HGB 14.0 07/29/2017 1101   HCT 44.4 06/08/2024 0339   HCT 44.2 07/29/2017 1101   PLT 174 06/08/2024 0339   PLT 187 07/29/2017 1101   MCV 90.1 06/08/2024 0339   MCV 83 07/29/2017 1101   MCH 29.2 06/08/2024 0339   MCHC 32.4 06/08/2024 0339   RDW 15.7 (H) 06/08/2024 0339   RDW 16.8 (H) 07/29/2017 1101   LYMPHSABS 2.3 06/06/2024 1533   LYMPHSABS 2.2 07/29/2017 1101   MONOABS 0.8 06/06/2024 1533   EOSABS 0.1 06/06/2024 1533   EOSABS 0.4 07/29/2017 1101   BASOSABS 0.1 06/06/2024 1533   BASOSABS 0.1 07/29/2017 1101     Parts of this note may have been dictated using voice recognition software. There may be variances in spelling and vocabulary which are unintentional. Not all  errors are proofread. Please notify the dino if any discrepancies are noted or if  the meaning of any statement is not clear.

## 2024-07-06 ENCOUNTER — Other Ambulatory Visit: Payer: Self-pay

## 2024-07-06 ENCOUNTER — Telehealth: Payer: Self-pay

## 2024-07-06 ENCOUNTER — Other Ambulatory Visit

## 2024-07-06 DIAGNOSIS — I5032 Chronic diastolic (congestive) heart failure: Secondary | ICD-10-CM

## 2024-07-06 DIAGNOSIS — E785 Hyperlipidemia, unspecified: Secondary | ICD-10-CM

## 2024-07-06 DIAGNOSIS — I10 Essential (primary) hypertension: Secondary | ICD-10-CM

## 2024-07-06 DIAGNOSIS — I480 Paroxysmal atrial fibrillation: Secondary | ICD-10-CM

## 2024-07-06 DIAGNOSIS — Z794 Long term (current) use of insulin: Secondary | ICD-10-CM

## 2024-07-06 MED ORDER — APIXABAN 5 MG PO TABS: 5.0000 mg | ORAL_TABLET | Freq: Two times a day (BID) | ORAL | 1 refills | Status: AC

## 2024-07-06 MED ORDER — ATORVASTATIN CALCIUM 80 MG PO TABS: 80.0000 mg | ORAL_TABLET | Freq: Every day | ORAL | 1 refills | Status: AC

## 2024-07-06 MED ORDER — TORSEMIDE 20 MG PO TABS: 40.0000 mg | ORAL_TABLET | Freq: Every day | ORAL | 0 refills | Status: AC | PRN

## 2024-07-06 MED ORDER — ISOSORBIDE MONONITRATE ER 120 MG PO TB24: 120.0000 mg | ORAL_TABLET | Freq: Every day | ORAL | 1 refills | Status: AC

## 2024-07-06 MED ORDER — THIAMINE HCL 100 MG PO TABS: 100.0000 mg | ORAL_TABLET | Freq: Every day | ORAL | 0 refills | Status: DC

## 2024-07-06 MED ORDER — FOLIC ACID 1 MG PO TABS: 1.0000 mg | ORAL_TABLET | Freq: Every day | ORAL | 0 refills | Status: AC

## 2024-07-06 MED ORDER — EPLERENONE 25 MG PO TABS: 25.0000 mg | ORAL_TABLET | Freq: Every day | ORAL | 1 refills | Status: DC

## 2024-07-06 MED ORDER — MULTI-VITAMIN/MINERALS PO TABS: 1.0000 | ORAL_TABLET | Freq: Every day | ORAL | 0 refills | Status: AC

## 2024-07-06 MED ORDER — AMLODIPINE BESYLATE 10 MG PO TABS: 10.0000 mg | ORAL_TABLET | Freq: Every day | ORAL | 0 refills | Status: AC

## 2024-07-06 MED ORDER — EZETIMIBE 10 MG PO TABS: 10.0000 mg | ORAL_TABLET | Freq: Every day | ORAL | 10 refills | Status: AC

## 2024-07-06 NOTE — Addendum Note (Signed)
 Addended by: DARRELL BRUCKNER on: 07/06/2024 03:57 PM   Modules accepted: Orders

## 2024-07-06 NOTE — Progress Notes (Signed)
   07/06/2024  Patient ID: [name] [name], male   DOB: [birth year], 68 y.o.   MRN: [id number]  During pharmacy outreach encounter with patient, they mention a need to switch all of their prescriptions to the following pharmacy:  [id number] - [place] ([place]), [place] - [address] [place]) [place] [address] Phone: [phone]  Fax: [phone]   They no longer wish to use [employer] packaging services and have cancelled with them.  Routing request to cardio team for refills on medications they prescribe: Atorvastatin  80mg  1 tablet daily Eliquis  5mg  BID Eplerenone  25mg  1 tablet daily Ezetimibe  10mg  1 tablet daily Isosorbide  Mono 120mg  1 tablet daily  Jon VEAR Lindau, PharmD Clinical Pharmacist [phone]

## 2024-07-06 NOTE — Progress Notes (Signed)
 07/06/2024 Name: [name] [name] MRN: [id number] DOB: 07/15/1956  Chief Complaint  Patient presents with   Medication Management   Diabetes   Hypertension    [name] [name] is a 68 y.o. year old male who presented for a telephone visit.   They were referred to the pharmacist by their PCP for assistance in managing diabetes and complex medication management.    Subjective:  Care Team: Primary Care Provider: Mercer Clotilda SAUNDERS, [name]   Medication Access/Adherence  Current Pharmacy:  [facility] - [address] [place] - [address] [place] [address] Phone: [phone] Fax: [phone]  [facility] [place]  - [place], [place] - [address] [place] [address] Phone: [phone] Fax: [phone]  [facility] [address] [place]), [place] - [address] [place] [address] Phone: [phone] Fax: [phone]  [facility] [address] [place] [address] Phone: [phone] Fax: [phone]  [facility] [address] [place] [address] Phone: [phone] Fax: [phone]   Patient reports affordability concerns with their medications: No  Patient reports access/transportation concerns to their pharmacy: No  Patient reports adherence concerns with their medications:  No     Diabetes:  Current medications: Jardiance  25mg , Metformin  (self-stopped), Lantus  20 units into skin daily, Humalog  TID ss  Inject 1 Units into the skin 3 (three) times daily with meals. Sliding scale 70-140=0 Units 141-170=1 Units 171-200=2 Units 201-230=3 Units 231-260=4 Units 261-290=5 Units 291-320=6 Units 231-350=7 Units 351-380=8 Units Over 380 10 units   Medications tried in the past: Tresiba   Current glucose readings:    Observed patterns:  Patient denies  hypoglycemic [name]/sx including dizziness, shakiness, sweating. Patient denies hyperglycemic symptoms including polyuria, polydipsia, polyphagia, nocturia, neuropathy, blurred vision.  Report need for refills on both insulin  products, will coordinate with endo   Macrovascular and Microvascular Risk Reduction:  Statin? yes (Atorvastatin  80mg ); ACEi/ARB? yes (Losartan  50mg ) Last urinary albumin/creatinine ratio:  Lab Results  Component Value Date   MICRALBCREAT 636.3 (H) 06/24/2024   MICRALBCREAT 7 01/26/2016   Last eye exam:  Lab Results  Component Value Date   HMDIABEYEEXA No Retinopathy 10/21/2020   Last foot exam: 07/01/2024 Tobacco Use:  Tobacco Use: Medium Risk (07/01/2024)   Patient History    Smoking Tobacco Use: Former    Smokeless Tobacco Use: Never    Passive Exposure: Not on file   Hypertension:  Current medications: Amlodipine  10mg  daily, Losartan  50mg  daily, Metoprolol  25mg  1/2 tab daily  Patient has a validated, automated, upper arm home BP cuff Current blood pressure readings readings: 150/83 today, reports it has slowly come down since increasing losartan  last week  Patient denies hypotensive [name]/sx including dizziness, lightheadedness.  Patient denies hypertensive symptoms including headache, chest pain, shortness of breath  Report need for refills on cardio meds to new pharmacy, will coordinate with cardio  Objective:  Lab Results  Component Value Date   HGBA1C 8.4 (H) 06/04/2024    Lab Results  Component Value Date   CREATININE 1.32 06/24/2024   BUN 21 06/24/2024   NA 140 06/24/2024   K 4.5 06/24/2024   CL 104 06/24/2024   CO2 27 06/24/2024    Lab Results  Component Value Date   CHOL 123 06/07/2024   HDL 40 (L) 06/07/2024   LDLCALC 65 06/07/2024   LDLDIRECT 111.0 04/22/2015   TRIG 88 06/07/2024  CHOLHDL 3.1 06/07/2024    Medications Reviewed Today     Reviewed by [name] [name] (Pharmacist) on 07/06/24 at [phone]  Med List Status:  <None>   Medication Order Taking? Sig Documenting Provider Last Dose Status Informant  acetaminophen  (TYLENOL ) 500 MG tablet [id number] Yes Take 1,000 mg by mouth every 6 (six) hours as needed (pain). Provider, Historical, [name]  Active Self   Discontinued 07/06/24 [phone] (Patient Preference) amLODipine  (NORVASC ) 10 MG tablet [id number] Yes Take 1 tablet (10 mg total) by mouth daily. [name] [name]  Active   atorvastatin  (LIPITOR ) 80 MG tablet [id number] Yes TAKE 1 TABLET BY MOUTH ONCE DAILY Bensimhon, [name] SAUNDERS, [name]  Active Self  Blood Glucose Monitoring Suppl DEVI [id number]  1 each by Does not apply route in the morning, at noon, and at bedtime. May substitute to any manufacturer covered by patient'[name] insurance. [name] Clotilda SAUNDERS, [name]  Active Self  Continuous Glucose Receiver (FREESTYLE LIBRE 3 READER) DEVI [id number]  1 Device by Does not apply route continuous. [name] Clotilda SAUNDERS, [name]  Active Self  Continuous Glucose Sensor (FREESTYLE LIBRE 3 SENSOR) [name] [id number] Yes 1 Device by Does not apply route every 14 (fourteen) days. [name] [name]  Active Self  ELIQUIS  5 MG TABS tablet [id number] Yes TAKE 1 TABLET BY MOUTH TWICE DAILY [name] [name]  Active Self           Med Note ([name] [name]   Wed Jun 03, 2024 10:07 PM) @1800   eplerenone  (INSPRA ) 25 MG tablet [id number] Yes Take 1 tablet (25 mg total) by mouth daily. [name] [name]  Active Self  ezetimibe  (ZETIA ) 10 MG tablet [id number] Yes TAKE 1 TABLET BY MOUTH ONCE DAILY [name] [name]  Active Self  folic acid  (FOLVITE ) 1 MG tablet [id number] Yes Take 1 tablet (1 mg total) by mouth daily. [name] [name]  Active   Glucose Blood (BLOOD GLUCOSE TEST STRIPS) STRP [id number]  1 each by In Vitro route in the morning, at noon, and at bedtime. May substitute to any manufacturer covered by patient'[name] insurance. [name] Clotilda SAUNDERS, [name]  Active Self  insulin  glargine (LANTUS  SOLOSTAR) 100 UNIT/ML Solostar Pen [id number] Yes Inject 20  Units into the skin at bedtime. [name] [name]  Active   insulin  lispro (HUMALOG ) 100 UNIT/ML injection [id number] Yes Inject 1 Units into the skin 3 (three) times daily with meals. Sliding scale 70-140=0 Units 141-170=1 Units 171-200=2 Units 201-230=3 Units 231-260=4 Units 261-290=5 Units 291-320=6 Units 231-350=7 Units 351-380=8 Units Over 380 10 units Provider, Historical, [name]  Active Self  Insulin  Pen Needle 32G X 4 MM MISC [id number]  Use as directed with Lantus . [name] [name]  Active   isosorbide  mononitrate (IMDUR ) 120 MG 24 hr tablet [id number] Yes TAKE 1 TABLET BY MOUTH ONCE DAILY [name] [name]  Active Self  JARDIANCE  25 MG TABS tablet [id number] Yes TAKE 1 TABLET BY MOUTH ONCE DAILY CONTACT OFFICE FOR APPOINTMENT [name] [name]  Active Self  Lancets Misc. MISC [id number]  1 each by Does not apply route in the morning, at noon, and at bedtime. May substitute to any manufacturer covered by patient'[name] insurance. [name] Clotilda SAUNDERS, [name]  Active Self  losartan  (COZAAR ) 50 MG tablet [id number] Yes Take 1 tablet (50 mg total) by mouth daily. [name] Clotilda SAUNDERS, [name]  Active    Patient not taking:   Discontinued 07/06/24 0913 (Patient Preference)  Med Note>> [name] [name], [facility]   07/06/2024  9:13 AM upset stomach    metoprolol  succinate (TOPROL -XL) 25 MG 24 hr tablet [id number] Yes Take 0.5 tablets (12.5 mg total) by mouth daily. [name] Clotilda SAUNDERS, [name]  Active   Multiple Vitamin (MULTIVITAMIN WITH MINERALS) TABS tablet [id number] Yes Take 1 tablet by mouth daily. [name] [name]  Active   nitroGLYCERIN  (NITROSTAT ) 0.4 MG SL tablet [id number]  DISSOLVE 1 TABLET UNDER THE TONGUE AS NEEDED FOR CHEST PAIN EVERY 5 MINUTES UP TO 3 TIMES. IF NO RELIEF CALL 911.  Patient not taking: Reported on 06/29/2024   Bensimhon, Daniel R, [name]  Active   polyethylene glycol powder (GLYCOLAX /MIRALAX ) 17 GM/SCOOP powder [id number]  Dissolve 1 capful (17g) in 4-8 ounces of liquid and take  by mouth daily as needed for mild constipation.  Patient not taking: Reported on 06/29/2024   [name] [name]  Active   thiamine  (VITAMIN B1) 100 MG tablet [id number]  Take 1 tablet (100 mg total) by mouth daily. [name] [name]  Active   torsemide  (DEMADEX ) 20 MG tablet [id number] Yes Take 2 tablets (40 mg total) by mouth daily as needed (as needed for weight gain >2 lbs in 24 hrs or > 5 lbs in a week , increased leg swelling, increased shortness of breath. May take another dose if symptoms persist and contact provider). [name] [name]  Active               Assessment/Plan:   Diabetes: - Currently uncontrolled; goal A1c <7%. Cardiorenal risk reduction is opportunities for improvement.. Blood pressure is not at goal <130/80. LDL is at goal.  - Reviewed goal A1c, goal fasting, and goal 2 hour post prandial glucose. Recommended to check glucose continuously with sensor, connected to office portal. - Recommend to continue current medication therapy. Removed metformin  from list as patient reports he has not taken in some time due to causing upset stomach. -They are requesting new refills on insulin  from endo, will coordinate request  Hypertension: - Currently uncontrolled - Reviewed long term cardiovascular and renal outcomes of uncontrolled blood pressure - Reviewed appropriate blood pressure monitoring technique and reviewed goal blood pressure. Recommended to check home blood pressure and heart rate daily - Recommend to continue current med therapy. Consider dose increase on losartan  if BP remains elevated.     Follow Up Plan: 2 weeks  Jon [name] [name], PharmD Clinical Pharmacist [phone]

## 2024-07-06 NOTE — Patient Instructions (Signed)
 Visit Information  Thank you for taking time to visit with me today. Please don't hesitate to contact me if I can be of assistance to you before our next scheduled telephone appointment.  Following are the goals we discussed today:   Goals Addressed             This Visit's Progress    COMPLETED: [facility] Transitions of Care (TOC) Care Plan       Problems:  Recent Hospitalization for treatment of admission for hypertensive crisis, ischemic stroke  06/15/2024 daughter reports BP 160/90.    Weight is stable.  No swelling,  Daughter reports getting up and down difficulty.  Vision problems and neuropathy. 06/22/2024  Daughter reports no weight gain and no swelling. 06/29/2024  Daughter reports BP medications changes per MD.  CBG today of 270 non fasting. 07/06/2024  Daughter reports BP has been running good.  Patient has not needed CBG SSI coverage recently,  Medication management barrier does not have Losartan .   06/15/2024  Patient is taking losartan .   06/22/2024 Daughter reports patient is taking all medications as prescribed. Has not had to take any insulin  since hospital discharge. CBG under good control per daughter. 06/29/2024   Increase in Losartan  per PCP. Daughter reports patient is taking medications as prescribed. 07/06/2024 continues to take medications as prescribed. Daughter denies any concerns with medications at this time.  No Hospital Follow Up Provider appointment.   06/15/2024  Has an appointment with Dr. Carlie. 06/22/2024 Hospital follow up was cancelled by MD office and is rescheduled for this week. 06/29/2024  Hospital follow up completed.  Pending delivery of rollator by [employer],  06/11/2024  Call back from daughter who states they have not received rollator.  06/15/2024 daughter reports that they still have not gotten rollator. 06/22/2024 Daughter confirms patient got rollator and is using it well. 07/06/2024 Continues to use rollator and be active with PT. Patient continues to have  knee pain and weak legs.  Pending start of home health  06/11/2024  Call back from daughter who states they have not heard from [facility]. 06/15/2024  daughter confirms patient has started his PT.   06/22/2024  Daughter confirms that patient remain active with PT twice a week. 06/29/2024  Continues to use walker and remains active with PT Diarrhea - daughter thinks related to Metformin   06/15/2024  Daughter reports diarrhea has stopped 06/22/2024 daughter believes patient is having diarrhea because he goes straight to the bathroom after eating. 06/29/2024  Metformin  discontinued.  BP elevated:  06/15/2024  Reviewed home BP readings and BP is up and ranging 160/90.  06/22/2024 BP remains elevated and daughter to discuss with PCP and reviewed home reading at [facility] this week.  Patient is taking all medications as prescribed. 06/29/2024  Daughter unable to provide lastest BP reading. Last reading she has with her at time of call was 160/87 on 06/26/2024.  07/06/2024 BP remain 130-150/70's.  Taking medications as prescribed and continues to be monitored at home.    Goal:  Over the next 30 days, the patient will not experience hospital readmission  Interventions:  Transitions of Care: Doctor Visits  - discussed the importance of doctor visits Confirmed that patient is active with PT. Reviewed recent CBG reading- reading today of 1126  Confirmed that patient has his medications and is taking them as prescribed, reviewed medication changes from PCP Reviewed importance of daily BP checks twice a day and recording. Reviewed current plan is for patient to remain with  his 3 daughters at this time.  Daughter reports patient is doing well. SABRA    Heart Failure Interventions: Discussed the importance of keeping all appointments with provider Reviewed weights.-    Hypertension Interventions: Last practice recorded BP readings:  BP Readings from Last 3 Encounters:  07/06/24 (!) 150/83  07/01/24 120/70  06/29/24  (!) 160/87    Evaluation of current treatment plan related to hypertension self management and patient's adherence to plan as established by provider Provided education to patient re: stroke prevention, s/s of heart attack and stroke Advised patient, providing education and rationale, to monitor blood pressure daily and record, calling PCP for findings outside established parameters.  Reviewed this again and discussed with daughter recent BP readings are too high. Confirmed patient is taking his medications as prescribed.  Reviewed scheduled/upcoming provider appointments including: PCP Provided education on prescribed diet low sodium heart health diet Reviewed and encouraged BP home monitoring twice a day and taking LOG to MD appointments. Reviewed current BP and weights Stroke: Reviewed Importance of taking all medications as prescribed Reviewed Importance of attending all scheduled provider appointments Assessed for signs and symptoms of stroke Reviewed when to call 911.   Patient Self Care Activities:  Attend all scheduled provider appointments Call pharmacy for medication refills 3-7 days in advance of running out of medications Call provider office for new concerns or questions  Take medications as prescribed   call office if I gain more than 2 pounds in one day or 5 pounds in one week do ankle pumps when sitting track weight in diary use salt in moderation watch for swelling in feet, ankles and legs every day weigh myself daily track symptoms and what helps feel better or worse check blood pressure daily write blood pressure results in a log or diary keep a blood pressure log take blood pressure log to all doctor appointments call doctor for signs and symptoms of high blood pressure keep all doctor appointments take medications for blood pressure exactly as prescribed report new symptoms to your doctor Call 911 for any signs of stroke  Seek medication attention after any  fall. Continue to monitor CBG and BP since medications changes. For any concerns for abnormal readings -call PCP   Plan:  Patient has completed TOC program successfully.  Offered CCM services and daughter has declined at this time. She will stay in close contact with PCP.            If you are experiencing a Mental Health or Behavioral Health Crisis or need someone to talk to, please call the Suicide and Crisis Lifeline: 988 call the [place]  National Suicide Prevention Lifeline: (201)520-5854 or TTY: 5201807023 TTY 916-801-7016) to talk to a trained counselor call 1-800-273-TALK (toll free, 24 hour hotline) call 911   Care plan and visit instructions communicated with the patient verbally today. Patient agrees to receive a copy in MyChart. Active MyChart status and patient understanding of how to access instructions and care plan via MyChart confirmed with patient.     Alan Ee, RN, BSN, CEN [employer]- Transition of Care Team.  [facility] [phone]

## 2024-07-06 NOTE — Transitions of Care (Post Inpatient/ED Visit) (Signed)
 Transition of Care week 5  Visit Note  07/06/2024  Name: [name] [name] MRN: [id number]          DOB: June 03, 1956  Situation: Patient enrolled in [facility] 30-day program. Visit completed with patient by telephone.   Background:   Initial Transition Care Management Follow-up Telephone Call Discharge Date and Diagnosis: 06/08/24, Hypertensive emergency without congestive heart failure   Past Medical History:  Diagnosis Date   Anxiety    CAD (coronary artery disease), native coronary artery    a. Nonobstructive CAD by cath 2013 - diffuse distal and branch vessel CAD, no severe disease in the major coronaries, LV mild global hypokinesis, EF 45%. b. ETT-Sestamibi 5/14: EF 31%, small fixed inferior defect with no ischemia.   Chronic CHF (HCC)    a. Mixed ICM/NICM (?EtOH). EF 35% in 2008. Echo 5/13: EF 60-65%, mod LVH, EF 45% on V gram in 12/2011. EF 12/2012: EF 50-55%, mild LVH, inferobasal HK, mild MR. ETT-Ses 5/14 EF 41%. Cardiac MRI 5/14: EF 44%, mild global HK, subepicardial delayed enhancement in nonspecific RV insertion pattern.   COLONIC POLYPS, HX OF 12/30/2006   Gout    H/O atrial flutter 08/13/2005   a. Ablations in 2007, 2008.   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  Reports patient is doing well. Reports that he continues to take his medications as prescribed. Daughter reports that patient continues to go back and  fourth between daughters houses. Remains active with PT.  Daughter denies any new problems or concerns.  Patient Reported Symptoms: Cognitive Cognitive Status: Other: (phone call with daughter.)      Neurological Neurological Review of Symptoms: Weakness Neurological Comment: daughter reports that patient continues to have knee pain and feels like his legs are weak. He is using walker and remain active with PT.  HEENT HEENT Symptoms Reported: Not assessed      Cardiovascular Cardiovascular Symptoms Reported: Other: Other Cardiovascular Symptoms: BP in good control per daughter. family continues to monitor and record. Does patient have uncontrolled Hypertension?: Yes Is patient checking Blood Pressure at home?: Yes Patient's Recent BP reading at home: 150/83 Cardiovascular Management Strategies: Medication therapy, Diet modification, Routine screening, Medical device Weight: 191 lb 3.2 oz (86.7 kg)  Respiratory Respiratory Symptoms Reported: Not assesed    Endocrine Endocrine Symptoms Reported: No symptoms reported Is patient checking blood sugars at home?: Yes List most recent blood sugar readings, include date and time of day: CBG today of 126 Endocrine Self-Management Outcome: 5 (very good)  Gastrointestinal Gastrointestinal Symptoms Reported: No symptoms reported      Genitourinary Genitourinary Symptoms Reported: No symptoms reported    Integumentary Integumentary Symptoms Reported: No symptoms reported    Musculoskeletal Musculoskelatal Symptoms Reviewed: Unsteady gait, Weakness        Psychosocial Psychosocial Symptoms Reported: Not assessed         Today's Vitals  07/06/24 1210  BP: (!) 150/83  Pulse: 62  Weight: 191 lb 3.2 oz (86.7 kg)      Medications Reviewed Today     Reviewed by Rumalda Alan PENNER, RN (Registered Nurse) on 07/06/24 at 1152  Med List Status: <None>   Medication Order Taking? Sig Documenting Provider Last Dose Status Informant  acetaminophen   (TYLENOL ) 500 MG tablet [id number] Yes Take 1,000 mg by mouth every 6 (six) hours as needed (pain). [provider]  Active Self  amLODipine  (NORVASC ) 10 MG tablet [id number] Yes Take 1 tablet (10 mg total) by mouth daily. Mercer Clotilda SAUNDERS, MD  Active   atorvastatin  (LIPITOR ) 80 MG tablet [id number] Yes TAKE 1 TABLET BY MOUTH ONCE DAILY Bensimhon, Daniel R, MD  Active Self  Blood Glucose Monitoring Suppl DEVI [id number] Yes 1 each by Does not apply route in the morning, at noon, and at bedtime. May substitute to any manufacturer covered by patient's insurance. Mercer Clotilda SAUNDERS, MD  Active Self  Continuous Glucose Receiver (FREESTYLE LIBRE 3 READER) DEVI [id number] Yes 1 Device by Does not apply route continuous. Mercer Clotilda SAUNDERS, MD  Active Self  Continuous Glucose Sensor (FREESTYLE LIBRE 3 SENSOR) [place] [id number] Yes 1 Device by Does not apply route every 14 (fourteen) days. Dartha Ernst, MD  Active Self  ELIQUIS  5 MG TABS tablet [id number] Yes TAKE 1 TABLET BY MOUTH TWICE DAILY McLean, Dalton S, MD  Active Self           Med Note (SATTERFIELD, DARIUS E   Wed Jun 03, 2024 10:07 PM) @1800   eplerenone  (INSPRA ) 25 MG tablet [id number] Yes Take 1 tablet (25 mg total) by mouth daily. [name], [place]  Active Self  ezetimibe  (ZETIA ) 10 MG tablet [id number] Yes TAKE 1 TABLET BY MOUTH ONCE DAILY McLean, Dalton S, MD  Active Self  folic acid  (FOLVITE ) 1 MG tablet [id number] Yes Take 1 tablet (1 mg total) by mouth daily. Mercer Clotilda SAUNDERS, MD  Active   Glucose Blood (BLOOD GLUCOSE TEST STRIPS) STRP [id number] Yes 1 each by In Vitro route in the morning, at noon, and at bedtime. May substitute to any manufacturer covered by patient's insurance. Mercer Clotilda SAUNDERS, MD  Active Self  insulin  glargine (LANTUS  SOLOSTAR) 100 UNIT/ML Solostar Pen [id number] Yes Inject 20 Units into the skin at bedtime. Al-Sultani, Anmar, MD  Active   insulin  lispro (HUMALOG ) 100 UNIT/ML injection [id number] Yes Inject 1 Units into  the skin 3 (three) times daily with meals. Sliding scale 70-140=0 Units 141-170=1 Units 171-200=2 Units 201-230=3 Units 231-260=4 Units 261-290=5 Units 291-320=6 Units 231-350=7 Units 351-380=8 Units Over 380 10 units [provider]  Active Self  Insulin  Pen Needle 32G X 4 MM MISC [id number] Yes Use as directed with Lantus . Al-Sultani, Anmar, MD  Active   isosorbide  mononitrate (IMDUR ) 120 MG 24 hr tablet [id number] Yes TAKE 1 TABLET BY MOUTH ONCE DAILY McLean, Dalton S, MD  Active Self  JARDIANCE  25 MG TABS tablet [id number] Yes TAKE 1 TABLET BY MOUTH ONCE DAILY CONTACT OFFICE FOR APPOINTMENT Dartha Ernst, MD  Active Self  Lancets Misc. MISC [id number] Yes 1 each by Does not apply route in the morning, at noon, and at bedtime. May substitute to any manufacturer covered by patient's insurance. Mercer Clotilda SAUNDERS, MD  Active Self  losartan  (COZAAR ) 50 MG tablet [id number] Yes Take 1 tablet (50 mg total) by mouth daily. Mercer Clotilda SAUNDERS, MD  Active   metoprolol  succinate (TOPROL -XL) 25 MG 24 hr tablet  [id number] Yes Take 0.5 tablets (12.5 mg total) by mouth daily. Mercer Clotilda SAUNDERS, MD  Active   Multiple Vitamins-Minerals (MULTIVITAMIN WITH MINERALS) tablet [id number] Yes Take 1 tablet by mouth daily. Mercer Clotilda SAUNDERS, MD  Active   nitroGLYCERIN  (NITROSTAT ) 0.4 MG SL tablet [id number] Yes DISSOLVE 1 TABLET UNDER THE TONGUE AS NEEDED FOR CHEST PAIN EVERY 5 MINUTES UP TO 3 TIMES. IF NO RELIEF CALL 911. Bensimhon, Daniel R, MD  Active   polyethylene glycol powder (GLYCOLAX /MIRALAX ) 17 GM/SCOOP powder [id number] Yes Dissolve 1 capful (17g) in 4-8 ounces of liquid and take by mouth daily as needed for mild constipation. Al-Sultani, Anmar, MD  Active   thiamine  (VITAMIN B1) 100 MG tablet [id number] Yes Take 1 tablet (100 mg total) by mouth daily. Mercer Clotilda SAUNDERS, MD  Active   torsemide  (DEMADEX ) 20 MG tablet [id number] Yes Take 2 tablets (40 mg total) by mouth daily as needed (as needed for weight gain  >2 lbs in 24 hrs or > 5 lbs in a week , increased leg swelling, increased shortness of breath. May take another dose if symptoms persist and contact provider). Mercer Clotilda SAUNDERS, MD  Active               Goals Addressed             This Visit's Progress    COMPLETED: [facility] Transitions of Care (TOC) Care Plan       Problems:  Recent Hospitalization for treatment of admission for hypertensive crisis, ischemic stroke  06/15/2024 daughter reports BP 160/90.    Weight is stable.  No swelling,  Daughter reports getting up and down difficulty.  Vision problems and neuropathy. 06/22/2024  Daughter reports no weight gain and no swelling. 06/29/2024  Daughter reports BP medications changes per MD.  CBG today of 270 non fasting. 07/06/2024  Daughter reports BP has been running good.  Patient has not needed CBG SSI coverage recently,  Medication management barrier does not have Losartan .   06/15/2024  Patient is taking losartan .   06/22/2024 Daughter reports patient is taking all medications as prescribed. Has not had to take any insulin  since hospital discharge. CBG under good control per daughter. 06/29/2024   Increase in Losartan  per PCP. Daughter reports patient is taking medications as prescribed. 07/06/2024 continues to take medications as prescribed. Daughter denies any concerns with medications at this time.  No Hospital Follow Up Provider appointment.   06/15/2024  Has an appointment with Dr. Carlie. 06/22/2024 Hospital follow up was cancelled by MD office and is rescheduled for this week. 06/29/2024  Hospital follow up completed.  Pending delivery of rollator by [employer],  06/11/2024  Call back from daughter who states they have not received rollator.  06/15/2024 daughter reports that they still have not gotten rollator. 06/22/2024 Daughter confirms patient got rollator and is using it well. 07/06/2024 Continues to use rollator and be active with PT. Patient continues to have knee pain and weak legs.   Pending start of home health  06/11/2024  Call back from daughter who states they have not heard from [facility]. 06/15/2024  daughter confirms patient has started his PT.   06/22/2024  Daughter confirms that patient remain active with PT twice a week. 06/29/2024  Continues to use walker and remains active with PT Diarrhea - daughter thinks related to Metformin   06/15/2024  Daughter reports diarrhea has stopped 06/22/2024 daughter believes patient is having diarrhea because he goes straight to the bathroom after eating. 06/29/2024  Metformin   discontinued.  BP elevated:  06/15/2024  Reviewed home BP readings and BP is up and ranging 160/90.  06/22/2024 BP remains elevated and daughter to discuss with PCP and reviewed home reading at [facility] this week.  Patient is taking all medications as prescribed. 06/29/2024  Daughter unable to provide lastest BP reading. Last reading she has with her at time of call was 160/87 on 06/26/2024.  07/06/2024 BP remain 130-150/70's.  Taking medications as prescribed and continues to be monitored at home.    Goal:  Over the next 30 days, the patient will not experience hospital readmission  Interventions:  Transitions of Care: Doctor Visits  - discussed the importance of doctor visits Confirmed that patient is active with PT. Reviewed recent CBG reading- reading today of 1126  Confirmed that patient has his medications and is taking them as prescribed, reviewed medication changes from PCP Reviewed importance of daily BP checks twice a day and recording. Reviewed current plan is for patient to remain with his 3 daughters at this time.  Daughter reports patient is doing well. SABRA    Heart Failure Interventions: Discussed the importance of keeping all appointments with provider Reviewed weights.-    Hypertension Interventions: Last practice recorded BP readings:  BP Readings from Last 3 Encounters:  07/06/24 (!) 150/83  07/01/24 120/70  06/29/24 (!) 160/87     Evaluation of current treatment plan related to hypertension self management and patient's adherence to plan as established by provider Provided education to patient re: stroke prevention, s/s of heart attack and stroke Advised patient, providing education and rationale, to monitor blood pressure daily and record, calling PCP for findings outside established parameters.  Reviewed this again and discussed with daughter recent BP readings are too high. Confirmed patient is taking his medications as prescribed.  Reviewed scheduled/upcoming provider appointments including: PCP Provided education on prescribed diet low sodium heart health diet Reviewed and encouraged BP home monitoring twice a day and taking LOG to MD appointments. Reviewed current BP and weights Stroke: Reviewed Importance of taking all medications as prescribed Reviewed Importance of attending all scheduled provider appointments Assessed for signs and symptoms of stroke Reviewed when to call 911.   Patient Self Care Activities:  Attend all scheduled provider appointments Call pharmacy for medication refills 3-7 days in advance of running out of medications Call provider office for new concerns or questions  Take medications as prescribed   call office if I gain more than 2 pounds in one day or 5 pounds in one week do ankle pumps when sitting track weight in diary use salt in moderation watch for swelling in feet, ankles and legs every day weigh myself daily track symptoms and what helps feel better or worse check blood pressure daily write blood pressure results in a log or diary keep a blood pressure log take blood pressure log to all doctor appointments call doctor for signs and symptoms of high blood pressure keep all doctor appointments take medications for blood pressure exactly as prescribed report new symptoms to your doctor Call 911 for any signs of stroke  Seek medication attention after any  fall. Continue to monitor CBG and BP since medications changes. For any concerns for abnormal readings -call PCP   Plan:  Patient has completed TOC program successfully.  Offered CCM services and daughter has declined at this time. She will stay in close contact with PCP.         Recommendation:   Continue Current Plan of Care  Follow Up Plan:   Closing From:  Transitions of Care Program  Alan Ee, RN, BSN, [employer] Population Health- Transition of Care Team.  [facility] [phone]

## 2024-07-07 ENCOUNTER — Encounter: Payer: Self-pay | Admitting: "Endocrinology

## 2024-07-07 ENCOUNTER — Encounter: Payer: Self-pay | Admitting: Family Medicine

## 2024-07-07 NOTE — Progress Notes (Signed)
 Patient ID: [name] [name], male   DOB: 1956/04/13, 68 y.o.   MRN: [id number] PCP: Mercer Clotilda SAUNDERS, MD HF Cardiology: Dr. Rolan  68 y.o. with history of HTN, diabetes, paroxysmal atrial fibrillation, and CAD presents for cardiology followup.  He was admitted in 5/13 with chest pain with exertion.  LHC was done showing global HK with EF 45% and diffuse, severe distal and branch vessel disease.  There was no interventional option, but it was suspect that this disease could be causing his anginal-type pain.  Echo at that time was read as showing EF 60-65%.  He was admitted in 5/14 with hypertensive urgency and chest pain.  He ruled out for MI and BP was controlled.  His EF was 50-55% by echo.  ETT-Sestamibi done as outpatient showed small fixed inferior defect with no ischemia but EF was 31%.  He was quite hypertensive during the study.  Cardiac MRI done to confirm EF in 5/14 showed EF 44%.  He had an episode of atrial fibrillation/RVR in 3/15 but went back into NSR.  Echo in 2/16 showed EF 55-60% with moderate diastolic dysfunction.   He had a Cardiolite  in 9/16 showing inferior ischemia.  He was having exertional dyspnea with less activity than normal.  There was concern for worsened CAD and he was sent for [facility] in 9/16. By RHC, filling pressures were not significantly elevated. He had a severe distal RCA stenosis that was treated with DES x 2.  Echo showed EF 55-60%.    In 10/16, he was admitted with acute cholecystitis.  He received a cholecystostomy tube.  Cholecystectomy done in 6/17.   Atrial fibrillation in 1/17, cardioverted to NSR.   Recurrent concerning chest pain in 3/17.  Repeat LHC showed diffuse distal and branch vessel disease consistent with poorly-controlled DM, but patent RCA stents.    He was admitted with atrial fibrillation in 12/17. Started on sotalol  and converted to NSR.    He had recurrent chest pain in 5/18 with abnormal Cardiolite .  Cath was done again in 5/18,  showing diffuse CAD with no good interventional options.   Admitted 11/20 with increased dyspnea and exertional chest pain. Hgb went down to 8.1. GI consulted.  Had LHC that showed severe distal and branch vessel disease but no interventional targets and not good anatomy for CABG. Continued on medical management. Capsule endoscopy without recent or active bleeding, no AVMs. He went into atrial flutter during his hospitalization. He went back into NSR, however, after discharge.  +Snoring and daytime sleepiness.   He has had episodes of syncope associated with vigorous coughing.   He was in the ER with atrial flutter and dyspnea in 4/21. Zio monitor in 9/22 showed NSR with PACs.   Evaluated in ED 01/14/22 for cough, found to be COVID +. Received molnupiravir .  Admitted 01/22/22 with CP. Cards consulted, troponin negative x 2, Echo showed EF 55-60%, severe asymmetric LV hypertrophy, normal RV. No new WMA and CP resolved, so LHC deferred.  Coreg  decreased to 12.5 bid due to low BP and Mobitz Type 1. Discharged home, weight 200 lbs.   cMRI 10/23 concerning for hypertrophic cardiomyopathy.  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  LMNA mutations can cause a HCM phenotype.   Scheduled for TEE/DCCV for recurrent atrial flutter in 5/24. LA appendage thrombus seen so DCCV aborted (noncompliant with Eliquis ). He was brought back for repeat TEE/DCCV in 7/24 after being compliant  with Eliquis . He was in NSR when he came in for the procedure.   He was admitted in 1/25 at [facility] with expressive aphasia that resolved.  He had not been taking any medications except for occasionally Eliquis .  Imaging did not show CVA, TIA suspected. Echo showed EF 60-65% with severe LVH and normal RV. Carotid dopplers showed 40-59% LICA stenosis. LDL was 159 (not taking statin) and HgbA1c was 13.2 (not compliant with diabetes regimen).   Post hospital follow up 1/25,  he was in atrial flutter. Not on sotalol  => was not on it when he went to the hospital in [facility] and they did not restart it. NYHA II and volume stable. He was loaded with amiodarone  with plan to follow up in 2 weeks. If remained in AFL, arrange TEE-guided DCCV (given poor compliance and history of LA appendage thrombus).  S/p TEE/DCCV 2/25. LVEF 50-55%, severe asymmetric LVH of septal segment, RV normal, no  thrombus. Successful DCCV to NSR.  Admitted 10/25 with hypertensive emergency, AKI and encephalopathy. He had been non complaint with home medications. Required nitroglycerin  gtt for BP control. Neurology consulted due to confusion and felt it was multifactorial . MRA showed occlusion of nondominant left vertebral artery at the dural margin, US  carotid and EEG were unremarkable. Echo showed EF 55-60%, mild LVH. Drips weaned off and GDMT titrated, but limited by AKI. He was discharged home, weight 191 lbs.  Today he returns for post hospital HF follow up with daughter, [name], via phone. Overall feeling fine. No SOB walking on flat ground. Little swelling in toe. He has CP this AM since waking up, non exertional. He has positional dizziness, no syncope or falls. He lives with his daughter who manages his medications. Denies palpitations, abnormal bleeding, or PND/Orthopnea. Appetite ok. Weight at home 190 pounds. No ETOH x 1 month. Rarely requires torsemide .  ECG (personally reviewed): AFL with variable AV block, 69 bpm  Cardiomems: not using  Labs (1/24): K 3.7, creatinine 1.64 Labs (5/24): K 2.8, creatinine 2.01 Labs (7/24): LDL 48 Labs (1/25): K 3.6, creatinine 1.45 Labs (5/25): K 3.7, creatinine 1.46 Labs (10/25): K 4.1, creatinine 1.66, LDL 65  PMH: 1. DM2 2. Gout 3. HTN: Cough with ACEI.  4. Cardiomyopathy: Suspect mixed ischemic/nonischemic (?ETOH-related).  EF 35% in 2008.  Echo in 5/13 showed EF 60-65% with moderate LVH but EF was 45% on LV-gram in 5/13.  Echo (5/14) with EF  50-55%, mild LVH, inferobasal HK, mild MR.  ETT-Sestamibi in 5/14, however, showed EF 31%.  Cardiac MRI (6/14) with EF 44%, mild global hypokinesis, subepicardial delayed enhancement in a nonspecific RV insertion site pattern.  Echo (2/16) with EF 55-60%, moderate diastolic dysfunction. RHC (9/16) with mean RA 8, PA 39/20 mean 28, mean PCWP 14, CI 2.2. Echo (9/16) with EF 55-60%, grade II diastolic dysfunction.  - Echo (6/17) with EF 55-60%, moderate LVH, grade II diastolic dysfunction, mild AI, mild MR.  - RHC (11/20): mean RA 4, PA 46/14 mean 26, mean PCWP 13, CI 3.23 - Echo (9/20): EF 60-65%, moderate LVH, normal RV size and systolic function, mild-moderate MR, mild AI.  - Echo (5/21): EF 55%, RV normal - Has Cardiomems.  - Echo (6/23): EF 55-60%, severe asymmetric LV hypertrophy, normal RV. - cMRI (11/23): LVEF 47%, no LVOT, moderate to severe asymmetric hypertrophy of the basal to mid inferoseptal and anteroseptum, RVEF 46%, ECV 34%, non-coronary LGE pattern most consistent with hypertrophic CM - Echo (1/25): EF 60-65%, severe LVH,  normal RV - Echo (10/25): EF 55-60%, mild LVH, normal RV 5. CAD: LHC in 2010 with mild nonobstructive disease.  LHC (5/13) with diffuse distal and branch vessel disease, mild global hypokinesis and EF 45%.  ETT-Sestamibi (5/14): EF 31%, small fixed inferior defect with no ischemia.  Lexiscan  Cardiolite  (9/16) with EF 26%, moderate inferior defect that was partially reversible, suggesting ischemia => High risk study. LHC (9/16) with 40-50% mLAD, diffuse up to 50% distal LAD, 90% ostium of branch off ramus, 95% dRCA => DES to RCA x 2, EF 55-60%.   - LHC (3/17) with diffuse branch and distal vessel disease c/w poorly controlled DM, RCA stents patent.  - LHC (5/18): Diffuse CAD with no good interventional option. 60% mRCA, 90% dLAD, 50% serial mid ramus stenoses, ramus branches with ostial 80-90% stenoses. - LHC (11/20): Severe distal and branch vessel disease but no  interventional targets and not good anatomy for CABG  6. H/o chronic HBV 7. Osteochondroma left shoulder.  8. Hyperlipidemia 9. Paroxysmal atrial fibrillation: Coumadin .  Developed cough and increased ESR with amiodarone .  DCCV in 1/17.  Recurrent atrial fibrillation in 12/17, sotalol  started.  10. Atrial flutter: had ablations in 2007 and 2008.  - Zio patch in 4/21 with 20% atrial flutter.  - DCCV 2/25 to NSR 11. OSA: Does not use CPAP.  12. Acute cholecystitis (10/16): Cholecystostomy tube placed.  Cholecystectomy 6/17.  13. CKD stage 3 14. Anemia - Suspected GI bleed but EGD/c-scope 9/20 without significant findings.  - Capsule Endoscopy (11/20) without recent or active bleeding, no AVMs.  15. Syncope: Suspected cough syncope.  16. OSA 17. TIA (1/25): Carotid dopplers (1/25) with 40-59% LICA stenosis.  - carotid doppler (10/25): 1-39% RICA stenosis, 1-39% LICA stenosis  SH: Married, prior smoker.  Some ETOH, occasionally heavy in past.  Does use occasional marijuana. Out of work chiropractor. 2 daughters in grad school.   FH: CAD  ROS: All systems reviewed and negative except as per HPI.   Current Outpatient Medications  Medication Sig Dispense Refill   acetaminophen  (TYLENOL ) 500 MG tablet Take 1,000 mg by mouth every 6 (six) hours as needed (pain).     amLODipine  (NORVASC ) 10 MG tablet Take 1 tablet (10 mg total) by mouth daily. 90 tablet 0   apixaban  (ELIQUIS ) 5 MG TABS tablet Take 1 tablet (5 mg total) by mouth 2 (two) times daily. 180 tablet 1   atorvastatin  (LIPITOR ) 80 MG tablet Take 1 tablet (80 mg total) by mouth daily. 90 tablet 1   Blood Glucose Monitoring Suppl DEVI 1 each by Does not apply route in the morning, at noon, and at bedtime. May substitute to any manufacturer covered by patient's insurance. 1 each 0   Continuous Glucose Receiver (FREESTYLE LIBRE 3 READER) DEVI 1 Device by Does not apply route continuous. 2 each 11   Continuous Glucose Sensor (FREESTYLE LIBRE  3 SENSOR) MISC 1 Device by Does not apply route every 14 (fourteen) days. 6 each 1   eplerenone  (INSPRA ) 25 MG tablet Take 1 tablet (25 mg total) by mouth daily. 90 tablet 1   ezetimibe  (ZETIA ) 10 MG tablet Take 1 tablet (10 mg total) by mouth daily. 30 tablet 10   folic acid  (FOLVITE ) 1 MG tablet Take 1 tablet (1 mg total) by mouth daily. 90 tablet 0   Glucose Blood (BLOOD GLUCOSE TEST STRIPS) STRP 1 each by In Vitro route in the morning, at noon, and at bedtime. May substitute to any manufacturer covered by  patient's insurance. 100 strip 11   insulin  glargine (LANTUS  SOLOSTAR) 100 UNIT/ML Solostar Pen Inject 20 Units into the skin at bedtime. 6 mL 0   insulin  lispro (HUMALOG ) 100 UNIT/ML injection Inject 1 Units into the skin 3 (three) times daily with meals. Sliding scale 70-140=0 Units 141-170=1 Units 171-200=2 Units 201-230=3 Units 231-260=4 Units 261-290=5 Units 291-320=6 Units 231-350=7 Units 351-380=8 Units Over 380 10 units     Insulin  Pen Needle 32G X 4 MM MISC Use as directed with Lantus . 100 each 0   isosorbide  mononitrate (IMDUR ) 120 MG 24 hr tablet Take 1 tablet (120 mg total) by mouth daily. 90 tablet 1   JARDIANCE  25 MG TABS tablet TAKE 1 TABLET BY MOUTH ONCE DAILY CONTACT OFFICE FOR APPOINTMENT 30 tablet 10   Lancets Misc. MISC 1 each by Does not apply route in the morning, at noon, and at bedtime. May substitute to any manufacturer covered by patient's insurance. 100 each 11   losartan  (COZAAR ) 50 MG tablet Take 1 tablet (50 mg total) by mouth daily. 90 tablet 3   metoprolol  succinate (TOPROL -XL) 25 MG 24 hr tablet Take 0.5 tablets (12.5 mg total) by mouth daily. 45 tablet 3   Multiple Vitamins-Minerals (MULTIVITAMIN WITH MINERALS) tablet Take 1 tablet by mouth daily. 90 tablet 0   nitroGLYCERIN  (NITROSTAT ) 0.4 MG SL tablet DISSOLVE 1 TABLET UNDER THE TONGUE AS NEEDED FOR CHEST PAIN EVERY 5 MINUTES UP TO 3 TIMES. IF NO RELIEF CALL 911. 25 tablet 11   thiamine  (VITAMIN B1)  100 MG tablet Take 1 tablet (100 mg total) by mouth daily. 90 tablet 0   torsemide  (DEMADEX ) 20 MG tablet Take 2 tablets (40 mg total) by mouth daily as needed (as needed for weight gain >2 lbs in 24 hrs or > 5 lbs in a week , increased leg swelling, increased shortness of breath. May take another dose if symptoms persist and contact provider). 180 tablet 0   polyethylene glycol powder (GLYCOLAX /MIRALAX ) 17 GM/SCOOP powder Dissolve 1 capful (17g) in 4-8 ounces of liquid and take by mouth daily as needed for mild constipation. 238 g 0   No current facility-administered medications for this encounter.   BP (!) 162/90   Pulse 79   Wt 89 kg (196 lb 3.2 oz)   SpO2 97%   BMI 26.61 kg/m    Wt Readings from Last 3 Encounters:  07/13/24 89 kg (196 lb 3.2 oz)  07/06/24 86.7 kg (191 lb 3.2 oz)  07/01/24 88 kg (194 lb)   PHYSICAL EXAM: General:  NAD. No resp difficulty, walked into clinic HEENT: Normal Neck: Supple. No JVD. Cor: Irregular rate & rhythm. No rubs, gallops or murmurs. Lungs: Clear Abdomen: Soft, nontender, nondistended.  Extremities: No cyanosis, clubbing, rash, edema Neuro: Alert & oriented x 3, moves all 4 extremities w/o difficulty. Affect pleasant.  ASSESSMENT/PLAN 1. Chronic diastolic CHF: EF 52% on cardiac MRI in 6/14 but EF improved back to 55-60% on 2/16 echo.  However, EF down to 26% on Cardiolite  in 9/16.  Echo in 9/20 showed EF 60-65%, moderate LVH, normal RV, mild-moderate MR.  Echo in 5/21 with EF 55%, normal RV.  He has a Cardiomems device, but has not used in months.  Echo in 6/23 showed EF 55-60% with severe asymmetric septal hypertrophy.  Cardiac MRI (10/23) showed LVEF 47%, no LVOT, moderate to severe asymmetric hypertrophy of the basal to mid inferoseptal and anteroseptum, RVEF 46%, ECV 34%, non-coronary LGE pattern most consistent with hypertrophic CM.  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  LMNA  mutations can cause a HCM phenotype.  Echo (1/25) with EF 60-65%, severe LVH, normal RV.  Echo (10/25) EF 55-60%, mild LVH, normal RV. NYHA class II symptoms, he is not volume overloaded. - Continue losartan  50 mg daily. BMET today. - Continue eplerenone  25 mg daily.  - Continue torsemide  40 mg PRN   - Continue empagliflozin  25 mg daily.   - Continue Toprol  XL 12.5 mg qhs - He has been referred again to genetic evaluation with Dr. Danford Pac given LMNA mutation that may be associated with HCM phenotype. We have tried several times to set up this appointment but to no avail.  - Needs to use Cardiomems. 2. Atrial fibrillation/flutter: Paroxysmal. He has had atrial flutter ablations x 2 remotely.  He was on sotalol . He tolerates atrial arrhythmias poorly, seems to worsen CHF.  Zio patch in 4/21 with 20% atrial flutter. He saw EP, repeat ablation was not recommended. Repeat Zio in 9/22 showed only NSR and PACs.  Sotalol  stopped due to poor compliance.  He was back in AFL at 1/25 follow up, off sotalol . Underwent TEE guided DCCV 2/25 to NSR. He remained on amiodarone . However, he was lost to follow up and is off amiodarone , and in rate controlled AFL today. Per chart review, looks like he was back out of rhythm 11/24/23 when he presented to ED after a fall after drinking ETOH. Remains in rate controlled AFL today. - Continue Toprol  XL 12.5 mg at bedtime - Continue Eliquis  5 mg bid. No bleeding issues, CBC today. - Refer to AF clinic to discuss rhythm management. (He missed initial appt due to be hospitalized) - Needs CPAP (see below) 3. Coronary artery disease: LHC in 9/16 showed diffuse CAD, most significant involving the distal RCA.  Patient had DES x 2 to RCA.  Cath in 3/17 with patent RCA stents and diffuse distal and branch vessel disease concerning for poorly controlled diabetes.  Cath in 5/18 again showed diffuse CAD without good interventional target.  Cath in 11/20 showed severe distal and branch  vessel disease but no interventional targets and not good anatomy for CABG. He is having atypical CP, suspect this is related to AFL - No ASA, given Eliquis  use.  - Continue atorvastatin  80 mg daily. Recent LDL 65 (10/25) - Continue beta blocker and Imdur  120 mg daily for angina control.   4. Hyperlipidemia: Continue atorvastatin  as above.  5. Hypertension: BP improving, elevated in clinic but just took AM BP-active meds before visit. - Continue amlodipine  10 mg daily. 6. OSA: He cannot tolerate CPAP.  - With recurrent atrial arrhythmias, he has been referred to Sleep Medicine for management of OSA. 7. CKD 3: Baseline SCr 1.4. - Continue Jardiance . BMET today.  8. DM: He is on insulin . A1C 8.4 - Followup with endocrinology.  9. ETOH use: None x 1 month. - Congratulated and encouraged continued abstinence. 10. Non-compliance: Chronic problem. Doing well now that he is living with daughter, who manages his medications. - If/when he moves back into his own home, will need to consider restarting paramedicine to help with compliance.  11. TIA: TIA in 1/25, probably due to noncompliance with Eliquis .   Follow up in 4 weeks with APP.  Harlene HERO [facility] FNP-BC 07/13/2024

## 2024-07-08 ENCOUNTER — Telehealth (HOSPITAL_COMMUNITY): Payer: Self-pay

## 2024-07-08 ENCOUNTER — Other Ambulatory Visit: Payer: Self-pay | Admitting: "Endocrinology

## 2024-07-08 MED ORDER — THIAMINE HCL 100 MG PO TABS: 100.0000 mg | ORAL_TABLET | Freq: Every day | ORAL | 0 refills | Status: AC

## 2024-07-08 NOTE — Telephone Encounter (Signed)
 Called to confirm/remind patient of their appointment at the [facility] on 07/13/24.   Appointment:   [] Confirmed  [x] Left mess   [] No answer/No voice mail  [] VM Full/unable to leave message  [] Phone not in service  And to bring in all medications and/or complete list.

## 2024-07-12 ENCOUNTER — Encounter: Payer: Self-pay | Admitting: "Endocrinology

## 2024-07-12 DIAGNOSIS — E119 Type 2 diabetes mellitus without complications: Secondary | ICD-10-CM

## 2024-07-13 ENCOUNTER — Ambulatory Visit (HOSPITAL_COMMUNITY): Payer: Self-pay | Admitting: Family Medicine

## 2024-07-13 ENCOUNTER — Ambulatory Visit (HOSPITAL_COMMUNITY)
Admit: 2024-07-13 | Discharge: 2024-07-13 | Disposition: A | Discharge status: Home / Self Care | Attending: Family Medicine | Admitting: Family Medicine

## 2024-07-13 ENCOUNTER — Encounter (HOSPITAL_COMMUNITY): Payer: Self-pay

## 2024-07-13 VITALS — BP 162/90 | HR 79 | Wt 196.2 lb

## 2024-07-13 DIAGNOSIS — Z7984 Long term (current) use of oral hypoglycemic drugs: Secondary | ICD-10-CM | POA: Insufficient documentation

## 2024-07-13 DIAGNOSIS — G4733 Obstructive sleep apnea (adult) (pediatric): Secondary | ICD-10-CM | POA: Insufficient documentation

## 2024-07-13 DIAGNOSIS — I2510 Atherosclerotic heart disease of native coronary artery without angina pectoris: Secondary | ICD-10-CM | POA: Diagnosis not present

## 2024-07-13 DIAGNOSIS — Z91199 Patient's noncompliance with other medical treatment and regimen due to unspecified reason: Secondary | ICD-10-CM | POA: Insufficient documentation

## 2024-07-13 DIAGNOSIS — R0789 Other chest pain: Secondary | ICD-10-CM | POA: Insufficient documentation

## 2024-07-13 DIAGNOSIS — Z9049 Acquired absence of other specified parts of digestive tract: Secondary | ICD-10-CM | POA: Insufficient documentation

## 2024-07-13 DIAGNOSIS — I10 Essential (primary) hypertension: Secondary | ICD-10-CM

## 2024-07-13 DIAGNOSIS — I4892 Unspecified atrial flutter: Secondary | ICD-10-CM | POA: Insufficient documentation

## 2024-07-13 DIAGNOSIS — E1122 Type 2 diabetes mellitus with diabetic chronic kidney disease: Secondary | ICD-10-CM | POA: Diagnosis not present

## 2024-07-13 DIAGNOSIS — Z79899 Other long term (current) drug therapy: Secondary | ICD-10-CM | POA: Insufficient documentation

## 2024-07-13 DIAGNOSIS — I4430 Unspecified atrioventricular block: Secondary | ICD-10-CM | POA: Insufficient documentation

## 2024-07-13 DIAGNOSIS — Z8673 Personal history of transient ischemic attack (TIA), and cerebral infarction without residual deficits: Secondary | ICD-10-CM | POA: Diagnosis not present

## 2024-07-13 DIAGNOSIS — Z794 Long term (current) use of insulin: Secondary | ICD-10-CM | POA: Diagnosis not present

## 2024-07-13 DIAGNOSIS — Z7901 Long term (current) use of anticoagulants: Secondary | ICD-10-CM | POA: Insufficient documentation

## 2024-07-13 DIAGNOSIS — E785 Hyperlipidemia, unspecified: Secondary | ICD-10-CM | POA: Diagnosis not present

## 2024-07-13 DIAGNOSIS — N183 Chronic kidney disease, stage 3 unspecified: Secondary | ICD-10-CM | POA: Insufficient documentation

## 2024-07-13 DIAGNOSIS — Z8639 Personal history of other endocrine, nutritional and metabolic disease: Secondary | ICD-10-CM

## 2024-07-13 DIAGNOSIS — B181 Chronic viral hepatitis B without delta-agent: Secondary | ICD-10-CM | POA: Insufficient documentation

## 2024-07-13 DIAGNOSIS — I130 Hypertensive heart and chronic kidney disease with heart failure and stage 1 through stage 4 chronic kidney disease, or unspecified chronic kidney disease: Secondary | ICD-10-CM | POA: Diagnosis present

## 2024-07-13 DIAGNOSIS — Z87891 Personal history of nicotine dependence: Secondary | ICD-10-CM | POA: Diagnosis not present

## 2024-07-13 DIAGNOSIS — I480 Paroxysmal atrial fibrillation: Secondary | ICD-10-CM | POA: Insufficient documentation

## 2024-07-13 DIAGNOSIS — I25119 Atherosclerotic heart disease of native coronary artery with unspecified angina pectoris: Secondary | ICD-10-CM | POA: Diagnosis not present

## 2024-07-13 DIAGNOSIS — Z955 Presence of coronary angioplasty implant and graft: Secondary | ICD-10-CM | POA: Diagnosis not present

## 2024-07-13 DIAGNOSIS — I5032 Chronic diastolic (congestive) heart failure: Secondary | ICD-10-CM | POA: Diagnosis not present

## 2024-07-13 DIAGNOSIS — F1010 Alcohol abuse, uncomplicated: Secondary | ICD-10-CM

## 2024-07-13 DIAGNOSIS — G459 Transient cerebral ischemic attack, unspecified: Secondary | ICD-10-CM

## 2024-07-13 LAB — BASIC METABOLIC PANEL WITH GFR
Anion gap: 8 (ref 5–15)
BUN: 21 mg/dL (ref 8–23)
CO2: 25 mmol/L (ref 22–32)
Calcium: 9.3 mg/dL (ref 8.9–10.3)
Chloride: 104 mmol/L (ref 98–111)
Creatinine, Ser: 1.54 mg/dL — ABNORMAL HIGH (ref 0.61–1.24)
GFR, Estimated: 48 mL/min — ABNORMAL LOW (ref >60)
Glucose, Bld: 226 mg/dL — ABNORMAL HIGH (ref 70–99)
Potassium: 4.2 mmol/L (ref 3.5–5.1)
Sodium: 137 mmol/L (ref 135–145)

## 2024-07-13 LAB — CBC
HCT: 50 % (ref 39.0–52.0)
Hemoglobin: 16 g/dL (ref 13.0–17.0)
MCH: 27.6 pg (ref 26.0–34.0)
MCHC: 32 g/dL (ref 30.0–36.0)
MCV: 86.2 fL (ref 80.0–100.0)
Platelets: 166 K/uL (ref 150–400)
RBC: 5.8 MIL/uL (ref 4.22–5.81)
RDW: 14.4 % (ref 11.5–15.5)
WBC: 6.2 K/uL (ref 4.0–10.5)
nRBC: 0 % (ref 0.0–0.2)

## 2024-07-13 MED ORDER — LANTUS SOLOSTAR 100 UNIT/ML ~~LOC~~ SOPN: 20.0000 [IU] | PEN_INJECTOR | Freq: Every day | SUBCUTANEOUS | 0 refills | Status: AC

## 2024-07-13 NOTE — Telephone Encounter (Signed)
 Do you want to re-prescribe this medication?

## 2024-07-13 NOTE — Patient Instructions (Addendum)
 Thank you for coming in today  If you had labs drawn today, any labs that are abnormal the clinic will call you No news is good news  EKG was done today   Medications: No changes  Follow up appointments:  Your physician recommends that you schedule a follow-up appointment in:  4 weeks in clinic   AFIB clinic for 07/21/2024 at 10 am  Sleep medicine 07/18/2024 at 11:15 am    Do the following things EVERYDAY: Weigh yourself in the morning before breakfast. Write it down and keep it in a log. Take your medicines as prescribed Eat low salt foods--Limit salt (sodium) to 2000 mg per day.  Stay as active as you can everyday Limit all fluids for the day to less than 2 liters   At the [facility], you and your health needs are our priority. As part of our continuing mission to provide you with exceptional heart care, we have created designated Provider Care Teams. These Care Teams include your primary Cardiologist (physician) and Advanced Practice Providers (APPs- Physician Assistants and Nurse Practitioners) who all work together to provide you with the care you need, when you need it.   You may see any of the following providers on your designated Care Team at your next follow up: Dr Toribio Fuel Dr Ezra Shuck Dr. Ria Gardenia Greig Lenetta, NP Caffie Shed, [place] [facility] [place], [place] Beckey Coe, NP Tinnie Redman, PharmD   Please be sure to bring in all your medications bottles to every appointment.    Thank you for choosing [facility]  If you have any questions or concerns before your next appointment please send us  a message through [place] or call our office at [phone].    TO LEAVE A MESSAGE FOR THE NURSE SELECT OPTION 2, PLEASE LEAVE A MESSAGE INCLUDING: YOUR NAME DATE OF BIRTH CALL BACK NUMBER REASON FOR CALL**this is important as we prioritize the call backs  YOU WILL RECEIVE A  CALL BACK THE SAME DAY AS LONG AS YOU CALL BEFORE 4:00 PM

## 2024-07-15 ENCOUNTER — Ambulatory Visit: Admitting: Podiatry

## 2024-07-15 ENCOUNTER — Encounter: Payer: Self-pay | Admitting: Podiatry

## 2024-07-15 ENCOUNTER — Ambulatory Visit (INDEPENDENT_AMBULATORY_CARE_PROVIDER_SITE_OTHER)

## 2024-07-15 DIAGNOSIS — M79675 Pain in left toe(s): Secondary | ICD-10-CM

## 2024-07-15 DIAGNOSIS — M79674 Pain in right toe(s): Secondary | ICD-10-CM

## 2024-07-15 DIAGNOSIS — M7752 Other enthesopathy of left foot: Secondary | ICD-10-CM

## 2024-07-15 DIAGNOSIS — B351 Tinea unguium: Secondary | ICD-10-CM

## 2024-07-15 DIAGNOSIS — M7751 Other enthesopathy of right foot: Secondary | ICD-10-CM | POA: Diagnosis not present

## 2024-07-16 NOTE — Progress Notes (Signed)
 Subjective:   Patient ID: [name] [name], male   DOB: 68 y.o.   MRN: [id number]   HPI Patient presents with caregiver with discomfort in the bottom of both feet with moderate swelling that is been more recent in duration and long painful nailbeds 1-5 both feet that he cannot take care of and do well with debridement   ROS      Objective:  Physical Exam  Neurovascular status unchanged muscle strength adequate range of motion adequate with patient found to have inflammation plantar aspect of both feet that appears to be moderate grade no area of intense discomfort when pressed with thick yellow brittle nailbeds that are painful 1-5 both feet     Assessment:  Inflammatory tendinitis bilateral low to mid grade with mycotic nail infection 1-5 both feet painful     Plan:  H&P reviewed both conditions.  Will start with shoe gear modification stretch exercises soaks and if symptoms persist then I may have to order more tests for the feet and I went ahead debrided nailbeds 1-5 both feet Neutra genic bleeding reappoint routine care  X-rays indicate that there does not appear to be fracture there is arthritis of both feet midfoot which I discussed

## 2024-07-17 ENCOUNTER — Emergency Department (HOSPITAL_BASED_OUTPATIENT_CLINIC_OR_DEPARTMENT_OTHER)

## 2024-07-17 ENCOUNTER — Emergency Department (HOSPITAL_BASED_OUTPATIENT_CLINIC_OR_DEPARTMENT_OTHER)
Admission: EM | Admit: 2024-07-17 | Discharge: 2024-07-17 | Disposition: A | Discharge status: Home / Self Care | Attending: Emergency Medicine | Admitting: Emergency Medicine

## 2024-07-17 ENCOUNTER — Encounter (HOSPITAL_BASED_OUTPATIENT_CLINIC_OR_DEPARTMENT_OTHER): Payer: Self-pay

## 2024-07-17 ENCOUNTER — Emergency Department (HOSPITAL_COMMUNITY)
Admission: EM | Admit: 2024-07-17 | Discharge: 2024-07-17 | Disposition: A | Discharge status: Home / Self Care | Attending: Emergency Medicine | Admitting: Emergency Medicine

## 2024-07-17 ENCOUNTER — Encounter (HOSPITAL_COMMUNITY): Payer: Self-pay | Admitting: Emergency Medicine

## 2024-07-17 ENCOUNTER — Other Ambulatory Visit: Payer: Self-pay

## 2024-07-17 ENCOUNTER — Ambulatory Visit: Admitting: Pulmonary Disease

## 2024-07-17 DIAGNOSIS — R040 Epistaxis: Secondary | ICD-10-CM | POA: Insufficient documentation

## 2024-07-17 DIAGNOSIS — Z794 Long term (current) use of insulin: Secondary | ICD-10-CM | POA: Insufficient documentation

## 2024-07-17 DIAGNOSIS — Z79899 Other long term (current) drug therapy: Secondary | ICD-10-CM | POA: Insufficient documentation

## 2024-07-17 DIAGNOSIS — I482 Chronic atrial fibrillation, unspecified: Secondary | ICD-10-CM | POA: Insufficient documentation

## 2024-07-17 DIAGNOSIS — I10 Essential (primary) hypertension: Secondary | ICD-10-CM | POA: Insufficient documentation

## 2024-07-17 DIAGNOSIS — Z7901 Long term (current) use of anticoagulants: Secondary | ICD-10-CM | POA: Insufficient documentation

## 2024-07-17 LAB — BASIC METABOLIC PANEL WITH GFR
Anion gap: 10 (ref 5–15)
BUN: 26 mg/dL — ABNORMAL HIGH (ref 8–23)
CO2: 25 mmol/L (ref 22–32)
Calcium: 9.7 mg/dL (ref 8.9–10.3)
Chloride: 101 mmol/L (ref 98–111)
Creatinine, Ser: 1.48 mg/dL — ABNORMAL HIGH (ref 0.61–1.24)
GFR, Estimated: 51 mL/min — ABNORMAL LOW (ref >60)
Glucose, Bld: 221 mg/dL — ABNORMAL HIGH (ref 70–99)
Potassium: 4.3 mmol/L (ref 3.5–5.1)
Sodium: 136 mmol/L (ref 135–145)

## 2024-07-17 LAB — CBC
HCT: 48.2 % (ref 39.0–52.0)
Hemoglobin: 15.7 g/dL (ref 13.0–17.0)
MCH: 28.1 pg (ref 26.0–34.0)
MCHC: 32.6 g/dL (ref 30.0–36.0)
MCV: 86.4 fL (ref 80.0–100.0)
Platelets: 186 K/uL (ref 150–400)
RBC: 5.58 MIL/uL (ref 4.22–5.81)
RDW: 14.3 % (ref 11.5–15.5)
WBC: 6.9 K/uL (ref 4.0–10.5)
nRBC: 0 % (ref 0.0–0.2)

## 2024-07-17 MED ORDER — INSULIN LISPRO 100 UNIT/ML IJ SOLN: 1.0000 [IU] | Freq: Three times a day (TID) | INTRAMUSCULAR | 0 refills | Status: AC

## 2024-07-17 MED ORDER — FENTANYL CITRATE (PF) 50 MCG/ML IJ SOSY: 50.0000 ug | PREFILLED_SYRINGE | Freq: Once | INTRAMUSCULAR | Status: DC

## 2024-07-17 MED ORDER — LIDOCAINE-EPINEPHRINE-TETRACAINE (LET) TOPICAL GEL
3.0000 mL | Freq: Once | TOPICAL | Status: AC
  Administered 2024-07-17: 3 mL via TOPICAL
  Filled 2024-07-17: qty 3

## 2024-07-17 MED ORDER — OXYMETAZOLINE HCL 0.05 % NA SOLN
3.0000 | Freq: Once | NASAL | Status: AC
  Administered 2024-07-17: 3 via NASAL
  Filled 2024-07-17: qty 30

## 2024-07-17 MED ORDER — FENTANYL CITRATE (PF) 50 MCG/ML IJ SOSY
50.0000 ug | PREFILLED_SYRINGE | Freq: Once | INTRAMUSCULAR | Status: AC
  Administered 2024-07-17: 50 ug via INTRAVENOUS
  Filled 2024-07-17: qty 1

## 2024-07-17 NOTE — ED Notes (Signed)
 ED Provider at bedside.

## 2024-07-17 NOTE — ED Provider Notes (Signed)
 [facility] AT [facility] Provider Note   CSN: [id number] Arrival date & time: 07/17/24  9843     Patient presents with: Epistaxis   [name] [name] is a 68 y.o. male.   Patient presents to the emergency department for evaluation of nosebleed.  Patient reports that his nose started bleeding earlier tonight, he was able to get it to stop.  He went to bed but then woke up with bleeding again.  Bleeding from the right side both times.  No history of trauma.  No recurrent history of nosebleeds in the past but he does take Eliquis  because of atrial fibrillation.       Prior to Admission medications   Medication Sig Start Date End Date Taking? Authorizing Provider  acetaminophen  (TYLENOL ) 500 MG tablet Take 1,000 mg by mouth every 6 (six) hours as needed (pain).    [provider]  amLODipine  (NORVASC ) 10 MG tablet Take 1 tablet (10 mg total) by mouth daily. 07/06/24   Mercer Clotilda SAUNDERS, MD  apixaban  (ELIQUIS ) 5 MG TABS tablet Take 1 tablet (5 mg total) by mouth 2 (two) times daily. 07/06/24   Rolan Ezra RAMAN, MD  atorvastatin  (LIPITOR ) 80 MG tablet Take 1 tablet (80 mg total) by mouth daily. 07/06/24   Rolan Ezra RAMAN, MD  Blood Glucose Monitoring Suppl DEVI 1 each by Does not apply route in the morning, at noon, and at bedtime. May substitute to any manufacturer covered by patient's insurance. 01/23/24   Mercer Clotilda SAUNDERS, MD  Continuous Glucose Receiver (FREESTYLE LIBRE 3 READER) DEVI 1 Device by Does not apply route continuous. 09/05/23   Mercer Clotilda SAUNDERS, MD  Continuous Glucose Sensor (FREESTYLE LIBRE 3 SENSOR) MISC 1 Device by Does not apply route every 14 (fourteen) days. 05/19/24   Motwani, Komal, MD  eplerenone  (INSPRA ) 25 MG tablet Take 1 tablet (25 mg total) by mouth daily. 07/06/24   Rolan Ezra RAMAN, MD  ezetimibe  (ZETIA ) 10 MG tablet Take 1 tablet (10 mg total) by mouth daily. 07/06/24   Rolan Ezra RAMAN, MD  folic acid  (FOLVITE ) 1  MG tablet Take 1 tablet (1 mg total) by mouth daily. 07/06/24   Mercer Clotilda SAUNDERS, MD  Glucose Blood (BLOOD GLUCOSE TEST STRIPS) STRP 1 each by In Vitro route in the morning, at noon, and at bedtime. May substitute to any manufacturer covered by patient's insurance. 01/23/24   Mercer Clotilda SAUNDERS, MD  insulin  glargine (LANTUS  SOLOSTAR) 100 UNIT/ML Solostar Pen Inject 20 Units into the skin at bedtime. 07/13/24   Motwani, Komal, MD  insulin  lispro (HUMALOG ) 100 UNIT/ML injection Inject 1 Units into the skin 3 (three) times daily with meals. Sliding scale 70-140=0 Units 141-170=1 Units 171-200=2 Units 201-230=3 Units 231-260=4 Units 261-290=5 Units 291-320=6 Units 231-350=7 Units 351-380=8 Units Over 380 10 units    [provider]  Insulin  Pen Needle 32G X 4 MM MISC Use as directed with Lantus . 06/08/24   Al-Sultani, Anmar, MD  isosorbide  mononitrate (IMDUR ) 120 MG 24 hr tablet Take 1 tablet (120 mg total) by mouth daily. 07/06/24   Rolan Ezra RAMAN, MD  JARDIANCE  25 MG TABS tablet TAKE 1 TABLET BY MOUTH ONCE DAILY CONTACT OFFICE FOR APPOINTMENT 09/16/23   Dartha Ernst, MD  Lancets Misc. MISC 1 each by Does not apply route in the morning, at noon, and at bedtime. May substitute to any manufacturer covered by patient's insurance. 01/23/24   Mercer Clotilda SAUNDERS, MD  losartan  (COZAAR ) 50 MG  tablet Take 1 tablet (50 mg total) by mouth daily. 06/24/24   Mercer Clotilda SAUNDERS, MD  metoprolol  succinate (TOPROL -XL) 25 MG 24 hr tablet Take 0.5 tablets (12.5 mg total) by mouth daily. 06/24/24   Mercer Clotilda SAUNDERS, MD  Multiple Vitamins-Minerals (MULTIVITAMIN WITH MINERALS) tablet Take 1 tablet by mouth daily. 07/06/24   Mercer Clotilda SAUNDERS, MD  nitroGLYCERIN  (NITROSTAT ) 0.4 MG SL tablet DISSOLVE 1 TABLET UNDER THE TONGUE AS NEEDED FOR CHEST PAIN EVERY 5 MINUTES UP TO 3 TIMES. IF NO RELIEF CALL 911. 06/15/24   Bensimhon, Toribio SAUNDERS, MD  polyethylene glycol powder (GLYCOLAX /MIRALAX ) 17 GM/SCOOP powder Dissolve 1 capful  (17g) in 4-8 ounces of liquid and take by mouth daily as needed for mild constipation. 06/08/24   Al-Sultani, Anmar, MD  thiamine  (VITAMIN B1) 100 MG tablet Take 1 tablet (100 mg total) by mouth daily. 07/08/24   Mercer Clotilda SAUNDERS, MD  torsemide  (DEMADEX ) 20 MG tablet Take 2 tablets (40 mg total) by mouth daily as needed (as needed for weight gain >2 lbs in 24 hrs or > 5 lbs in a week , increased leg swelling, increased shortness of breath. May take another dose if symptoms persist and contact provider). 07/06/24   Mercer Clotilda SAUNDERS, MD    Allergies: Shrimp [shellfish allergy], Ace inhibitors, and Other    Review of Systems  Updated Vital Signs BP (!) 154/92 (BP Location: Right Arm)   Pulse 88   Temp 98.2 F (36.8 C) (Oral)   Resp 20   Ht 6' (1.829 m)   Wt 88.5 kg   SpO2 100%   BMI 26.45 kg/m   Physical Exam Vitals and nursing note reviewed.  Constitutional:      General: He is not in acute distress.    Appearance: He is well-developed.  HENT:     Head: Normocephalic and atraumatic.     Nose:     Right Nostril: Epistaxis present.     Mouth/Throat:     Mouth: Mucous membranes are moist.  Eyes:     General: Vision grossly intact. Gaze aligned appropriately.     Extraocular Movements: Extraocular movements intact.     Conjunctiva/sclera: Conjunctivae normal.  Cardiovascular:     Rate and Rhythm: Normal rate and regular rhythm.     Pulses: Normal pulses.     Heart sounds: Normal heart sounds, S1 normal and S2 normal. No murmur heard.    No friction rub. No gallop.  Pulmonary:     Effort: Pulmonary effort is normal. No respiratory distress.     Breath sounds: Normal breath sounds.  Abdominal:     Palpations: Abdomen is soft.     Tenderness: There is no abdominal tenderness. There is no guarding or rebound.     Hernia: No hernia is present.  Musculoskeletal:        General: No swelling.     Cervical back: Full passive range of motion without pain, normal range of motion and  neck supple. No pain with movement, spinous process tenderness or muscular tenderness. Normal range of motion.     Right lower leg: No edema.     Left lower leg: No edema.  Skin:    General: Skin is warm and dry.     Capillary Refill: Capillary refill takes less than 2 seconds.     Findings: No ecchymosis, erythema, lesion or wound.  Neurological:     Mental Status: He is alert and oriented to person, place, and time.  GCS: GCS eye subscore is 4. GCS verbal subscore is 5. GCS motor subscore is 6.     Cranial Nerves: Cranial nerves 2-12 are intact.     Sensory: Sensation is intact.     Motor: Motor function is intact. No weakness or abnormal muscle tone.     Coordination: Coordination is intact.  Psychiatric:        Mood and Affect: Mood normal.        Speech: Speech normal.        Behavior: Behavior normal.     (all labs ordered are listed, but only abnormal results are displayed) Labs Reviewed - No data to display  EKG: None  Radiology: CT HEAD WO CONTRAST ( ) Result Date: 07/17/2024 EXAM: CT HEAD WITHOUT CONTRAST 07/17/2024 02:55:39 AM TECHNIQUE: CT of the head was performed without the administration of intravenous contrast. Automated exposure control, iterative reconstruction, and/or weight based adjustment of the mA/kV was utilized to reduce the radiation dose to as low as reasonably achievable. COMPARISON: 06/04/2024 CLINICAL HISTORY: Mental status change, unknown cause; has had recent stroke, family report mental status changes. FINDINGS: BRAIN AND VENTRICLES: No acute hemorrhage. No evidence of acute infarct. No hydrocephalus. No extra-axial collection. No mass effect or midline shift. Generalized cerebral atrophy. Ill-defined white matter hypoattenuation consistent with chronic small vessel ischemic disease. Intracranial arterial calcification. ORBITS: No acute abnormality. SINUSES: Mucosal thickening throughout the paranasal sinuses. SOFT TISSUES AND SKULL: No acute soft  tissue abnormality. No skull fracture. IMPRESSION: 1. No acute intracranial abnormality. 2. Generalized cerebral atrophy and white matter changes consistent with chronic small vessel ischemic disease. Electronically signed by: Franky Stanford MD 07/17/2024 03:02 AM EST RP Workstation: [id number]   DG Foot 2 Views Right Result Date: 07/15/2024 Please see detailed radiograph report in office note.  DG Foot 2 Views Left Result Date: 07/15/2024 Please see detailed radiograph report in office note.    Epistaxis Management  Date/Time: 07/17/2024 3:41 AM  Performed by: Haze Lonni PARAS, MD Authorized by: Haze Lonni PARAS, MD   Consent:    Consent obtained:  Verbal   Consent given by:  Patient   Risks, benefits, and alternatives were discussed: yes     Risks discussed:  Bleeding, nasal injury and pain Universal protocol:    Procedure explained and questions answered to patient or proxy's satisfaction: yes     Relevant documents present and verified: yes     Test results available: yes     Imaging studies available: yes     Required blood products, implants, devices, and special equipment available: yes     Site/side marked: yes     Immediately prior to procedure, a time out was called: yes     Patient identity confirmed:  Verbally with patient Procedure details:    Treatment site:  R anterior   Treatment method:  Nasal balloon   Treatment complexity:  Limited   Treatment episode: initial   Post-procedure details:    Assessment:  Bleeding stopped   Procedure completion:  Tolerated well, no immediate complications    Medications Ordered in the ED  oxymetazoline  (AFRIN) 0.05 % nasal spray 3 spray (3 sprays Right Nare Given by Other 07/17/24 0212)  lidocaine -EPINEPHrine -tetracaine  (LET) topical gel (3 mLs Topical Given by Other 07/17/24 0212)                                    Medical Decision Making Amount  and/or Complexity of Data Reviewed Radiology:  ordered.  Risk OTC drugs.   Patient presents to the emergency department for evaluation of nosebleed.  Patient had bleeding earlier that stopped and then restarted again tonight.  He does take Eliquis .  Very slight bleeding noted in the right nare at arrival, clots were evacuated by having the patient blow them out and then oxymetazoline  followed by let placed in the right nare.  Bleeding seems to have stopped.  Family very concerned that the nosebleed is secondary to an intracranial aneurysm or a new stroke.  Head CT performed.  Addendum: Patient did well until time of discharge when he started having some bleeding again from the right side.  He did not feel that repeat medications or attempts at cauterization would be helpful, rapid Rhino 5.5 cm nasal balloon was placed in the right nare.     Final diagnoses:  Right-sided epistaxis    ED Discharge Orders     None          Haze Lonni PARAS, MD 07/17/24 [phone]

## 2024-07-17 NOTE — ED Notes (Signed)
 Pt re-roomed from lobby, during check out, his nose started bleeding profusely again.  Pt is sitting upright conscious and alert in bed.  ENT cart at bedside, awaiting EDP

## 2024-07-17 NOTE — Discharge Instructions (Addendum)
 Your nose packing should be removed in 3 days (Monday). This can happen at your primary care office, urgent care or the ER. You can also call to try to schedule a same-day appointment with the ENT clinic (nose specialist), though it is often difficult to get same-day appointments.  Moving forward, you can spread little bit of Vaseline inside the front of each nostril twice a day, especially in dry weather, to keep your nose moist.  Dry air can lead to nosebleeds.  You can also use a humidifier near your bed at night.  I will have you hold your dose of eliquis  tonight and tomorrow morning, and then RESTART eliquis  tomorrow night.

## 2024-07-17 NOTE — ED Triage Notes (Addendum)
 Pt is on eliquis  and started having a nose bleed at 12 am. Got it to stop and he went to lay down in bed and it started bleeding again.

## 2024-07-17 NOTE — Progress Notes (Deleted)
 Synopsis: Referred in *** for *** by [name], FNP  Subjective:   PATIENT ID: [name] [name] GENDER: male DOB: Jun 20, 1956, MRN: [id number]  No chief complaint on file.   HPI  [name] [name] is a 68 y.o. male with HTN, T2DM, pAFib, CAD and HFrecEF s/p cardiomems presents today for ***  Was prescribed CPAP after a sleep study showed   Smokes marijuana   {Pulm [phone]}  Past Medical History:  Diagnosis Date   Anxiety    CAD (coronary artery disease), native coronary artery    a. Nonobstructive CAD by cath 2013 - diffuse distal and branch vessel CAD, no severe disease in the major coronaries, LV mild global hypokinesis, EF 45%. b. ETT-Sestamibi 5/14: EF 31%, small fixed inferior defect with no ischemia.   Chronic CHF (HCC)    a. Mixed ICM/NICM (?EtOH). EF 35% in 2008. Echo 5/13: EF 60-65%, mod LVH, EF 45% on V gram in 12/2011. EF 12/2012: EF 50-55%, mild LVH, inferobasal HK, mild MR. ETT-Ses 5/14 EF 41%. Cardiac MRI 5/14: EF 44%, mild global HK, subepicardial delayed enhancement in nonspecific RV insertion pattern.   COLONIC POLYPS, HX OF 12/30/2006   Gout    H/O atrial flutter 08/13/2005   a. Ablations in 2007, 2008.   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   PAF (paroxysmal atrial fibrillation) (HCC)    On coumadin    Sleep apnea    suppose to send mask but they never did (05/03/2015)   Type II diabetes mellitus (HCC)      Family History  Problem Relation Age of Onset   Diabetes Mother    Hypertension Mother    Heart attack Neg Hx    Stroke Neg Hx      Past Surgical History:   Procedure Laterality Date   A FLUTTER ABLATION  2007, 2008   catheter ablation    CARDIAC CATHETERIZATION N/A 05/03/2015   Procedure: Right/Left Heart Cath and Coronary Angiography;  Surgeon: Ezra GORMAN Shuck, MD;  Location: [facility] INVASIVE CV LAB;  Service: Cardiovascular;  Laterality: N/A;   CARDIAC CATHETERIZATION N/A 05/03/2015   Procedure: Coronary Stent Intervention;  Surgeon: Candyce GORMAN Reek, MD;  Location: [facility] INVASIVE CV LAB;  Service: Cardiovascular;  Laterality: N/A;   CARDIAC CATHETERIZATION N/A 11/04/2015   Procedure: Left Heart Cath and Coronary Angiography;  Surgeon: Ezra GORMAN Shuck, MD;  Location: [facility] INVASIVE CV LAB;  Service: Cardiovascular;  Laterality: N/A;   CARDIOVERSION N/A 08/24/2015   Procedure: CARDIOVERSION;  Surgeon: Aleene JINNY Passe, MD;  Location: [facility] ENDOSCOPY;  Service: Cardiovascular;  Laterality: N/A;   CARDIOVERSION N/A 12/14/2022   Procedure: CARDIOVERSION;  Surgeon: Shuck Ezra GORMAN, MD;  Location: [facility] INVASIVE CV LAB;  Service: Cardiovascular;  Laterality: N/A;   CARDIOVERSION N/A 10/02/2023   Procedure: CARDIOVERSION;  Surgeon: Shuck Ezra GORMAN, MD;  Location: [facility] INVASIVE CV LAB;  Service: Cardiovascular;  Laterality: N/A;   CHOLECYSTECTOMY N/A 02/07/2016   Procedure: LAPAROSCOPIC CHOLECYSTECTOMY WITH  INTRAOPERATIVE CHOLANGIOGRAM;  Surgeon: Camellia Blush, MD;  Location: [facility] ORS;  Service: General;  Laterality: N/A;   COLONOSCOPY WITH PROPOFOL  N/A 04/28/2019  Procedure: COLONOSCOPY WITH PROPOFOL ;  Surgeon: Saintclair Jasper, MD;  Location: [facility] ENDOSCOPY;  Service: Gastroenterology;  Laterality: N/A;   CORONARY ANGIOPLASTY  12/05/01   ESOPHAGOGASTRODUODENOSCOPY (EGD) WITH PROPOFOL  N/A 04/27/2019   Procedure: ESOPHAGOGASTRODUODENOSCOPY (EGD) WITH PROPOFOL ;  Surgeon: Saintclair Jasper, MD;  Location: [facility] ENDOSCOPY;  Service: Gastroenterology;  Laterality: N/A;   GIVENS CAPSULE STUDY N/A 06/29/2019   Procedure: GIVENS CAPSULE STUDY;  Surgeon: Dianna Specking, MD;  Location: [facility] ENDOSCOPY;   Service: Endoscopy;  Laterality: N/A;   LEFT HEART CATH AND CORONARY ANGIOGRAPHY N/A 12/28/2016   Procedure: Left Heart Cath and Coronary Angiography;  Surgeon: Rolan Ezra RAMAN, MD;  Location: [facility] INVASIVE CV LAB;  Service: Cardiovascular;  Laterality: N/A;   LEFT HEART CATHETERIZATION WITH CORONARY ANGIOGRAM N/A 12/19/2011   Procedure: LEFT HEART CATHETERIZATION WITH CORONARY ANGIOGRAM;  Surgeon: Ezra RAMAN Rolan, MD;  Location: [facility] CATH LAB;  Service: Cardiovascular;  Laterality: N/A;   OSTEOCHONDROMA EXCISION Left 1972   took bone tumor off my shoulder   POLYPECTOMY  04/28/2019   Procedure: POLYPECTOMY;  Surgeon: Saintclair Jasper, MD;  Location: [facility] ENDOSCOPY;  Service: Gastroenterology;;   PRESSURE SENSOR/CARDIOMEMS N/A 02/01/2020   Procedure: PRESSURE SENSOR/CARDIOMEMS;  Surgeon: Rolan Ezra RAMAN, MD;  Location: [facility] INVASIVE CV LAB;  Service: Cardiovascular;  Laterality: N/A;   RIGHT HEART CATH N/A 02/01/2020   Procedure: RIGHT HEART CATH;  Surgeon: Rolan Ezra RAMAN, MD;  Location: [facility] INVASIVE CV LAB;  Service: Cardiovascular;  Laterality: N/A;   RIGHT/LEFT HEART CATH AND CORONARY ANGIOGRAPHY N/A 07/01/2019   Procedure: RIGHT/LEFT HEART CATH AND CORONARY ANGIOGRAPHY;  Surgeon: Rolan Ezra RAMAN, MD;  Location: [facility] INVASIVE CV LAB;  Service: Cardiovascular;  Laterality: N/A;   TEE WITHOUT CARDIOVERSION N/A 12/14/2022   Procedure: TRANSESOPHAGEAL ECHOCARDIOGRAM;  Surgeon: Rolan Ezra RAMAN, MD;  Location: [facility] INVASIVE CV LAB;  Service: Cardiovascular;  Laterality: N/A;   TRANSESOPHAGEAL ECHOCARDIOGRAM (CATH LAB) N/A 10/02/2023   Procedure: TRANSESOPHAGEAL ECHOCARDIOGRAM;  Surgeon: Rolan Ezra RAMAN, MD;  Location: [facility] INVASIVE CV LAB;  Service: Cardiovascular;  Laterality: N/A;    Social History   Socioeconomic History   Marital status: Widowed    Spouse name: Not on file   Number of children: 2   Years of education: 14   Highest education level: Some college, no degree  Occupational History    Employer:  UNEMPLOYED  Tobacco Use   Smoking status: Former    Current packs/day: 0.00    Average packs/day: 4.0 packs/day for 18.0 years (72.0 ttl pk-yrs)    Types: Cigarettes    Start date: 08/13/1965    Quit date: 08/14/1983    Years since quitting: 40.9   Smokeless tobacco: Never  Vaping Use   Vaping status: Never Used  Substance and Sexual Activity   Alcohol use: Yes    Alcohol/week: 16.0 standard drinks of alcohol    Types: 6 Cans of beer, 10 Shots of liquor per week    Comment: 2-3 beers watching sports; occasional glass of liquor; drinks heavily in football season   Drug use: Yes    Types: Marijuana    Comment: twice a week   Sexual activity: Never  Other Topics Concern   Not on file  Social History Narrative   Not on file   Social Drivers of Health   Financial Resource Strain: Low Risk  (07/23/2022)   Overall Financial Resource Strain (CARDIA)    Difficulty of Paying Living Expenses: Not hard at all  Food Insecurity: Unknown (06/09/2024)   Hunger Vital  Sign    Worried About Programme Researcher, Broadcasting/film/video in the Last Year: Never true    Ran Out of Food in the Last Year: Not on file  Transportation Needs: No Transportation Needs (06/09/2024)   PRAPARE - Administrator, Civil Service (Medical): No    Lack of Transportation (Non-Medical): No  Physical Activity: Inactive (07/23/2022)   Exercise Vital Sign    Days of Exercise per Week: 0 days    Minutes of Exercise per Session: 0 min  Stress: No Stress Concern Present (07/23/2022)   [employer] of Occupational Health - Occupational Stress Questionnaire    Feeling of Stress : Not at all  Social Connections: Socially Isolated (06/03/2024)   Social Connection and Isolation Panel    Frequency of Communication with Friends and Family: More than three times a week    Frequency of Social Gatherings with Friends and Family: Three times a week    Attends Religious Services: Never    Active Member of Clubs or Organizations: No     Attends Banker Meetings: Never    Marital Status: Widowed  Intimate Partner Violence: Not At Risk (06/03/2024)   Humiliation, Afraid, Rape, and Kick questionnaire    Fear of Current or Ex-Partner: No    Emotionally Abused: No    Physically Abused: No    Sexually Abused: No     Allergies  Allergen Reactions   Shrimp [Shellfish Allergy] Nausea And Vomiting   Ace Inhibitors Cough   Other Hives and Other (See Comments)    Patient reports developing hives after receiving some antibiotic given in 1980''s at [facility]. He does not know which antibiotic.     Outpatient Medications Prior to Visit  Medication Sig Dispense Refill   acetaminophen  (TYLENOL ) 500 MG tablet Take 1,000 mg by mouth every 6 (six) hours as needed (pain).     amLODipine  (NORVASC ) 10 MG tablet Take 1 tablet (10 mg total) by mouth daily. 90 tablet 0   apixaban  (ELIQUIS ) 5 MG TABS tablet Take 1 tablet (5 mg total) by mouth 2 (two) times daily. 180 tablet 1   atorvastatin  (LIPITOR ) 80 MG tablet Take 1 tablet (80 mg total) by mouth daily. 90 tablet 1   Blood Glucose Monitoring Suppl DEVI 1 each by Does not apply route in the morning, at noon, and at bedtime. May substitute to any manufacturer covered by patient's insurance. 1 each 0   Continuous Glucose Receiver (FREESTYLE LIBRE 3 READER) DEVI 1 Device by Does not apply route continuous. 2 each 11   Continuous Glucose Sensor (FREESTYLE LIBRE 3 SENSOR) MISC 1 Device by Does not apply route every 14 (fourteen) days. 6 each 1   eplerenone  (INSPRA ) 25 MG tablet Take 1 tablet (25 mg total) by mouth daily. 90 tablet 1   ezetimibe  (ZETIA ) 10 MG tablet Take 1 tablet (10 mg total) by mouth daily. 30 tablet 10   folic acid  (FOLVITE ) 1 MG tablet Take 1 tablet (1 mg total) by mouth daily. 90 tablet 0   Glucose Blood (BLOOD GLUCOSE TEST STRIPS) STRP 1 each by In Vitro route in the morning, at noon, and at bedtime. May substitute to any manufacturer covered by  patient's insurance. 100 strip 11   insulin  glargine (LANTUS  SOLOSTAR) 100 UNIT/ML Solostar Pen Inject 20 Units into the skin at bedtime. 6 mL 0   insulin  lispro (HUMALOG ) 100 UNIT/ML injection Inject 0.01 mLs (1 Units total) into the skin 3 (three) times daily  with meals. Sliding scale 70-140=0 Units 141-170=1 Units 171-200=2 Units 201-230=3 Units 231-260=4 Units 261-290=5 Units 291-320=6 Units 231-350=7 Units 351-380=8 Units Over 380 10 units 2.7 mL 0   Insulin  Pen Needle 32G X 4 MM MISC Use as directed with Lantus . 100 each 0   isosorbide  mononitrate (IMDUR ) 120 MG 24 hr tablet Take 1 tablet (120 mg total) by mouth daily. 90 tablet 1   JARDIANCE  25 MG TABS tablet TAKE 1 TABLET BY MOUTH ONCE DAILY CONTACT OFFICE FOR APPOINTMENT 30 tablet 10   Lancets Misc. MISC 1 each by Does not apply route in the morning, at noon, and at bedtime. May substitute to any manufacturer covered by patient's insurance. 100 each 11   losartan  (COZAAR ) 50 MG tablet Take 1 tablet (50 mg total) by mouth daily. 90 tablet 3   metoprolol  succinate (TOPROL -XL) 25 MG 24 hr tablet Take 0.5 tablets (12.5 mg total) by mouth daily. 45 tablet 3   Multiple Vitamins-Minerals (MULTIVITAMIN WITH MINERALS) tablet Take 1 tablet by mouth daily. 90 tablet 0   nitroGLYCERIN  (NITROSTAT ) 0.4 MG SL tablet DISSOLVE 1 TABLET UNDER THE TONGUE AS NEEDED FOR CHEST PAIN EVERY 5 MINUTES UP TO 3 TIMES. IF NO RELIEF CALL 911. 25 tablet 11   polyethylene glycol powder (GLYCOLAX /MIRALAX ) 17 GM/SCOOP powder Dissolve 1 capful (17g) in 4-8 ounces of liquid and take by mouth daily as needed for mild constipation. 238 g 0   thiamine  (VITAMIN B1) 100 MG tablet Take 1 tablet (100 mg total) by mouth daily. 90 tablet 0   torsemide  (DEMADEX ) 20 MG tablet Take 2 tablets (40 mg total) by mouth daily as needed (as needed for weight gain >2 lbs in 24 hrs or > 5 lbs in a week , increased leg swelling, increased shortness of breath. May take another dose if symptoms persist  and contact provider). 180 tablet 0   No facility-administered medications prior to visit.    ROS   Objective:  Physical Exam Constitutional:      Appearance: Normal appearance.  HENT:     Head: Normocephalic and atraumatic.     Mouth/Throat:     Mouth: Mucous membranes are moist.     Pharynx: Oropharynx is clear. No oropharyngeal exudate or posterior oropharyngeal erythema.  Eyes:     General: No scleral icterus.    Conjunctiva/sclera: Conjunctivae normal.  Cardiovascular:     Rate and Rhythm: Normal rate and regular rhythm.     Heart sounds: Normal heart sounds. No murmur heard.    No friction rub. No gallop.  Pulmonary:     Effort: Pulmonary effort is normal. No respiratory distress.     Breath sounds: Normal breath sounds. No stridor. No wheezing, rhonchi or rales.  Musculoskeletal:     Right lower leg: No edema.     Left lower leg: No edema.  Neurological:     Mental Status: He is alert and oriented to person, place, and time.  Psychiatric:        Thought Content: Thought content normal.      There were no vitals filed for this visit.   on *** LPM *** RA BMI Readings from Last 3 Encounters:  07/17/24 26.45 kg/m  07/13/24 26.61 kg/m  07/06/24 25.93 kg/m   Wt Readings from Last 3 Encounters:  07/17/24 195 lb (88.5 kg)  07/13/24 196 lb 3.2 oz (89 kg)  07/06/24 191 lb 3.2 oz (86.7 kg)     CBC    Component Value Date/Time  WBC 6.2 07/13/2024 1025   RBC 5.80 07/13/2024 1025   HGB 16.0 07/13/2024 1025   HGB 14.0 07/29/2017 1101   HCT 50.0 07/13/2024 1025   HCT 44.2 07/29/2017 1101   PLT 166 07/13/2024 1025   PLT 187 07/29/2017 1101   MCV 86.2 07/13/2024 1025   MCV 83 07/29/2017 1101   MCH 27.6 07/13/2024 1025   MCHC 32.0 07/13/2024 1025   RDW 14.4 07/13/2024 1025   RDW 16.8 (H) 07/29/2017 1101   LYMPHSABS 2.3 06/06/2024 1533   LYMPHSABS 2.2 07/29/2017 1101   MONOABS 0.8 06/06/2024 1533   EOSABS 0.1 06/06/2024 1533   EOSABS 0.4 07/29/2017 1101    BASOSABS 0.1 06/06/2024 1533   BASOSABS 0.1 07/29/2017 1101    ***  Chest Imaging: ***  Pulmonary Functions Testing Results:     No data to display          FeNO: ***  Pathology: ***  Echocardiogram: ***  Heart Catheterization: ***  Sleep Studies: ***    Assessment & Plan:   No diagnosis found.  Discussion: ***   Current Outpatient Medications:    acetaminophen  (TYLENOL ) 500 MG tablet, Take 1,000 mg by mouth every 6 (six) hours as needed (pain)., Disp: , Rfl:    amLODipine  (NORVASC ) 10 MG tablet, Take 1 tablet (10 mg total) by mouth daily., Disp: 90 tablet, Rfl: 0   apixaban  (ELIQUIS ) 5 MG TABS tablet, Take 1 tablet (5 mg total) by mouth 2 (two) times daily., Disp: 180 tablet, Rfl: 1   atorvastatin  (LIPITOR ) 80 MG tablet, Take 1 tablet (80 mg total) by mouth daily., Disp: 90 tablet, Rfl: 1   Blood Glucose Monitoring Suppl DEVI, 1 each by Does not apply route in the morning, at noon, and at bedtime. May substitute to any manufacturer covered by patient's insurance., Disp: 1 each, Rfl: 0   Continuous Glucose Receiver (FREESTYLE LIBRE 3 READER) DEVI, 1 Device by Does not apply route continuous., Disp: 2 each, Rfl: 11   Continuous Glucose Sensor (FREESTYLE LIBRE 3 SENSOR) MISC, 1 Device by Does not apply route every 14 (fourteen) days., Disp: 6 each, Rfl: 1   eplerenone  (INSPRA ) 25 MG tablet, Take 1 tablet (25 mg total) by mouth daily., Disp: 90 tablet, Rfl: 1   ezetimibe  (ZETIA ) 10 MG tablet, Take 1 tablet (10 mg total) by mouth daily., Disp: 30 tablet, Rfl: 10   folic acid  (FOLVITE ) 1 MG tablet, Take 1 tablet (1 mg total) by mouth daily., Disp: 90 tablet, Rfl: 0   Glucose Blood (BLOOD GLUCOSE TEST STRIPS) STRP, 1 each by In Vitro route in the morning, at noon, and at bedtime. May substitute to any manufacturer covered by patient's insurance., Disp: 100 strip, Rfl: 11   insulin  glargine (LANTUS  SOLOSTAR) 100 UNIT/ML Solostar Pen, Inject 20 Units into the skin at  bedtime., Disp: 6 mL, Rfl: 0   insulin  lispro (HUMALOG ) 100 UNIT/ML injection, Inject 0.01 mLs (1 Units total) into the skin 3 (three) times daily with meals. Sliding scale 70-140=0 Units 141-170=1 Units 171-200=2 Units 201-230=3 Units 231-260=4 Units 261-290=5 Units 291-320=6 Units 231-350=7 Units 351-380=8 Units Over 380 10 units, Disp: 2.7 mL, Rfl: 0   Insulin  Pen Needle 32G X 4 MM MISC, Use as directed with Lantus ., Disp: 100 each, Rfl: 0   isosorbide  mononitrate (IMDUR ) 120 MG 24 hr tablet, Take 1 tablet (120 mg total) by mouth daily., Disp: 90 tablet, Rfl: 1   JARDIANCE  25 MG TABS tablet, TAKE 1 TABLET BY MOUTH  ONCE DAILY CONTACT OFFICE FOR APPOINTMENT, Disp: 30 tablet, Rfl: 10   Lancets Misc. MISC, 1 each by Does not apply route in the morning, at noon, and at bedtime. May substitute to any manufacturer covered by patient's insurance., Disp: 100 each, Rfl: 11   losartan  (COZAAR ) 50 MG tablet, Take 1 tablet (50 mg total) by mouth daily., Disp: 90 tablet, Rfl: 3   metoprolol  succinate (TOPROL -XL) 25 MG 24 hr tablet, Take 0.5 tablets (12.5 mg total) by mouth daily., Disp: 45 tablet, Rfl: 3   Multiple Vitamins-Minerals (MULTIVITAMIN WITH MINERALS) tablet, Take 1 tablet by mouth daily., Disp: 90 tablet, Rfl: 0   nitroGLYCERIN  (NITROSTAT ) 0.4 MG SL tablet, DISSOLVE 1 TABLET UNDER THE TONGUE AS NEEDED FOR CHEST PAIN EVERY 5 MINUTES UP TO 3 TIMES. IF NO RELIEF CALL 911., Disp: 25 tablet, Rfl: 11   polyethylene glycol powder (GLYCOLAX /MIRALAX ) 17 GM/SCOOP powder, Dissolve 1 capful (17g) in 4-8 ounces of liquid and take by mouth daily as needed for mild constipation., Disp: 238 g, Rfl: 0   thiamine  (VITAMIN B1) 100 MG tablet, Take 1 tablet (100 mg total) by mouth daily., Disp: 90 tablet, Rfl: 0   torsemide  (DEMADEX ) 20 MG tablet, Take 2 tablets (40 mg total) by mouth daily as needed (as needed for weight gain >2 lbs in 24 hrs or > 5 lbs in a week , increased leg swelling, increased shortness of breath. May  take another dose if symptoms persist and contact provider)., Disp: 180 tablet, Rfl: 0   Lamar Dales, MD Pulmonary, Critical Care & Sleep Medicine [facility] 07/17/24 8:37 AM

## 2024-07-17 NOTE — ED Provider Notes (Signed)
 [place] EMERGENCY DEPARTMENT AT [facility] Provider Note   CSN: [id number] Arrival date & time: 07/17/24  2035     Patient presents with: Epistaxis   [name] [name] is a 68 y.o. male on Eliquis  for A-fib, history of high blood pressure, presenting back to ED with concern for persistent pain and discomfort near his nasal packing today.  Patient was seen in the emergency department earlier this morning with a right-sided nosebleed that began spontaneously yesterday evening.  While in the ER the patient was treated conservatively initially, but had continued bleeding and ultimately had a 5 cm rapid Rhino placed within the right naris.  He returns today in the company of his daughter with concern for persistent bleeding around the Rhino site and feeling that it is getting dislodged, and pressure in his right sinus.   HPI     Prior to Admission medications   Medication Sig Start Date End Date Taking? Authorizing Provider  acetaminophen  (TYLENOL ) 500 MG tablet Take 1,000 mg by mouth every 6 (six) hours as needed (pain).    [provider]  amLODipine  (NORVASC ) 10 MG tablet Take 1 tablet (10 mg total) by mouth daily. 07/06/24   Mercer Clotilda SAUNDERS, MD  apixaban  (ELIQUIS ) 5 MG TABS tablet Take 1 tablet (5 mg total) by mouth 2 (two) times daily. 07/06/24   Rolan Ezra RAMAN, MD  atorvastatin  (LIPITOR ) 80 MG tablet Take 1 tablet (80 mg total) by mouth daily. 07/06/24   Rolan Ezra RAMAN, MD  Blood Glucose Monitoring Suppl DEVI 1 each by Does not apply route in the morning, at noon, and at bedtime. May substitute to any manufacturer covered by patient's insurance. 01/23/24   Mercer Clotilda SAUNDERS, MD  Continuous Glucose Receiver (FREESTYLE LIBRE 3 READER) DEVI 1 Device by Does not apply route continuous. 09/05/23   Mercer Clotilda SAUNDERS, MD  Continuous Glucose Sensor (FREESTYLE LIBRE 3 SENSOR) MISC 1 Device by Does not apply route every 14 (fourteen) days. 05/19/24   Motwani, Komal, MD   eplerenone  (INSPRA ) 25 MG tablet Take 1 tablet (25 mg total) by mouth daily. 07/06/24   Rolan Ezra RAMAN, MD  ezetimibe  (ZETIA ) 10 MG tablet Take 1 tablet (10 mg total) by mouth daily. 07/06/24   Rolan Ezra RAMAN, MD  folic acid  (FOLVITE ) 1 MG tablet Take 1 tablet (1 mg total) by mouth daily. 07/06/24   Mercer Clotilda SAUNDERS, MD  Glucose Blood (BLOOD GLUCOSE TEST STRIPS) STRP 1 each by In Vitro route in the morning, at noon, and at bedtime. May substitute to any manufacturer covered by patient's insurance. 01/23/24   Mercer Clotilda SAUNDERS, MD  insulin  glargine (LANTUS  SOLOSTAR) 100 UNIT/ML Solostar Pen Inject 20 Units into the skin at bedtime. 07/13/24   Motwani, Komal, MD  insulin  lispro (HUMALOG ) 100 UNIT/ML injection Inject 0.01 mLs (1 Units total) into the skin 3 (three) times daily with meals. Sliding scale 70-140=0 Units 141-170=1 Units 171-200=2 Units 201-230=3 Units 231-260=4 Units 261-290=5 Units 291-320=6 Units 231-350=7 Units 351-380=8 Units Over 380 10 units 07/17/24   Motwani, Obadiah, MD  Insulin  Pen Needle 32G X 4 MM MISC Use as directed with Lantus . 06/08/24   Al-Sultani, Anmar, MD  isosorbide  mononitrate (IMDUR ) 120 MG 24 hr tablet Take 1 tablet (120 mg total) by mouth daily. 07/06/24   Rolan Ezra RAMAN, MD  JARDIANCE  25 MG TABS tablet TAKE 1 TABLET BY MOUTH ONCE DAILY CONTACT OFFICE FOR APPOINTMENT 09/16/23   Dartha Obadiah, MD  Lancets Misc. MISC  1 each by Does not apply route in the morning, at noon, and at bedtime. May substitute to any manufacturer covered by patient's insurance. 01/23/24   Mercer Clotilda SAUNDERS, MD  losartan  (COZAAR ) 50 MG tablet Take 1 tablet (50 mg total) by mouth daily. 06/24/24   Mercer Clotilda SAUNDERS, MD  metoprolol  succinate (TOPROL -XL) 25 MG 24 hr tablet Take 0.5 tablets (12.5 mg total) by mouth daily. 06/24/24   Mercer Clotilda SAUNDERS, MD  Multiple Vitamins-Minerals (MULTIVITAMIN WITH MINERALS) tablet Take 1 tablet by mouth daily. 07/06/24   Mercer Clotilda SAUNDERS, MD  nitroGLYCERIN   (NITROSTAT ) 0.4 MG SL tablet DISSOLVE 1 TABLET UNDER THE TONGUE AS NEEDED FOR CHEST PAIN EVERY 5 MINUTES UP TO 3 TIMES. IF NO RELIEF CALL 911. 06/15/24   Bensimhon, Toribio SAUNDERS, MD  polyethylene glycol powder (GLYCOLAX /MIRALAX ) 17 GM/SCOOP powder Dissolve 1 capful (17g) in 4-8 ounces of liquid and take by mouth daily as needed for mild constipation. 06/08/24   Al-Sultani, Anmar, MD  thiamine  (VITAMIN B1) 100 MG tablet Take 1 tablet (100 mg total) by mouth daily. 07/08/24   Mercer Clotilda SAUNDERS, MD  torsemide  (DEMADEX ) 20 MG tablet Take 2 tablets (40 mg total) by mouth daily as needed (as needed for weight gain >2 lbs in 24 hrs or > 5 lbs in a week , increased leg swelling, increased shortness of breath. May take another dose if symptoms persist and contact provider). 07/06/24   Mercer Clotilda SAUNDERS, MD    Allergies: Shrimp [shellfish allergy], Ace inhibitors, and Other    Review of Systems  Updated Vital Signs BP (!) 148/100   Pulse 86   Temp 97.7 F (36.5 C) (Oral)   Resp 17   SpO2 100%   Physical Exam Constitutional:      General: He is not in acute distress. HENT:     Head: Normocephalic and atraumatic.     Comments: Right naris packed, Rhino Rocket extruding 1 cm Eyes:     Conjunctiva/sclera: Conjunctivae normal.     Pupils: Pupils are equal, round, and reactive to light.  Cardiovascular:     Rate and Rhythm: Normal rate. Rhythm irregular.  Pulmonary:     Effort: Pulmonary effort is normal. No respiratory distress.  Abdominal:     General: There is no distension.     Tenderness: There is no abdominal tenderness.  Skin:    General: Skin is warm and dry.  Neurological:     General: No focal deficit present.     Mental Status: He is alert. Mental status is at baseline.  Psychiatric:        Mood and Affect: Mood normal.        Behavior: Behavior normal.     (all labs ordered are listed, but only abnormal results are displayed) Labs Reviewed  BASIC METABOLIC PANEL WITH GFR -  Abnormal; Notable for the following components:      Result Value   Glucose, Bld 221 (*)    BUN 26 (*)    Creatinine, Ser 1.48 (*)    GFR, Estimated 51 (*)    All other components within normal limits  CBC    EKG: None  Radiology: CT HEAD WO CONTRAST ( ) Result Date: 07/17/2024 EXAM: CT HEAD WITHOUT CONTRAST 07/17/2024 02:55:39 AM TECHNIQUE: CT of the head was performed without the administration of intravenous contrast. Automated exposure control, iterative reconstruction, and/or weight based adjustment of the mA/kV was utilized to reduce the radiation dose to as low as reasonably achievable. COMPARISON:  06/04/2024 CLINICAL HISTORY: Mental status change, unknown cause; has had recent stroke, family report mental status changes. FINDINGS: BRAIN AND VENTRICLES: No acute hemorrhage. No evidence of acute infarct. No hydrocephalus. No extra-axial collection. No mass effect or midline shift. Generalized cerebral atrophy. Ill-defined white matter hypoattenuation consistent with chronic small vessel ischemic disease. Intracranial arterial calcification. ORBITS: No acute abnormality. SINUSES: Mucosal thickening throughout the paranasal sinuses. SOFT TISSUES AND SKULL: No acute soft tissue abnormality. No skull fracture. IMPRESSION: 1. No acute intracranial abnormality. 2. Generalized cerebral atrophy and white matter changes consistent with chronic small vessel ischemic disease. Electronically signed by: Franky Stanford MD 07/17/2024 03:02 AM EST RP Workstation: [id number]     Procedures   Medications Ordered in the ED  fentaNYL  (SUBLIMAZE ) injection 50 mcg (50 mcg Intravenous Given 07/17/24 2141)                                    Medical Decision Making Amount and/or Complexity of Data Reviewed Labs: ordered.  Risk Prescription drug management.   Patient is here with persistent nosebleed.  I suspect some of this is related to the anterior nasal packing raising some pressure and sliding of  the top of the nose.  I was able to reinsert the nasal packing deep into the nose and inflate with a rapid Rhino balloon taut with 5 additional cc air.  Was no further bleeding was noted.  I reviewed the patient's labs and his hemoglobin is stable.  He has some mild hyperglycemia.  Was given fentanyl  for pain which appeared to help.  Given his persistence of his bleeding, I did recommend that he hold his Eliquis  for the next 2 doses, then resume it tomorrow evening.  I did discuss with the patient and his daughter the risk of holding Eliquis , which can put him at risk for stroke and blood clot, but the persistent bleeding seems to be a larger concern at this time.  He will need the packing removed by Monday, at his doctor's office, ENT office, or other clinic.  No evidence of infection or indication for antibiotics.     Final diagnoses:  Epistaxis    ED Discharge Orders     None          Cottie Donnice PARAS, MD 07/17/24 2212

## 2024-07-17 NOTE — ED Notes (Signed)
 EDP at bedside to pack the bleeding nostril at this time.

## 2024-07-17 NOTE — ED Notes (Signed)
 Pt passed large clot from right nostril during triage. EDP at bedside, used afrin and LET gel covered gauze to be placed in nostril for constriction and improvement of bleeding.

## 2024-07-17 NOTE — Addendum Note (Signed)
 Addended by: ARLOA JEOFFREY SAILOR on: 07/17/2024 08:27 AM   Modules accepted: Orders

## 2024-07-17 NOTE — ED Notes (Signed)
 Patient transported to CT

## 2024-07-17 NOTE — ED Notes (Signed)
 EDP packed the pts nose.  The remained of device was taped to cheek and updated AVS printed for a ENT follow-up. Pt ambulatory to the lobby for DC.

## 2024-07-17 NOTE — ED Triage Notes (Signed)
 Pt arrives w/ c/o nose bleed. Has nasal balloon inserted at this time from where he was seen earlier today. Reports he has pressure behind rt eye & states the balloon has been oozing blood.

## 2024-07-20 ENCOUNTER — Encounter: Payer: Self-pay | Admitting: Adult Health

## 2024-07-20 ENCOUNTER — Ambulatory Visit: Payer: Self-pay

## 2024-07-20 ENCOUNTER — Encounter: Payer: Self-pay | Admitting: Pulmonary Disease

## 2024-07-20 ENCOUNTER — Ambulatory Visit: Admitting: Adult Health

## 2024-07-20 ENCOUNTER — Ambulatory Visit: Admitting: Pulmonary Disease

## 2024-07-20 VITALS — BP 136/77 | HR 93 | Ht 72.0 in | Wt 195.0 lb

## 2024-07-20 VITALS — BP 126/74 | HR 76 | Ht 69.0 in | Wt 196.0 lb

## 2024-07-20 DIAGNOSIS — I5020 Unspecified systolic (congestive) heart failure: Secondary | ICD-10-CM | POA: Diagnosis not present

## 2024-07-20 DIAGNOSIS — Z4800 Encounter for change or removal of nonsurgical wound dressing: Secondary | ICD-10-CM | POA: Diagnosis not present

## 2024-07-20 DIAGNOSIS — E119 Type 2 diabetes mellitus without complications: Secondary | ICD-10-CM

## 2024-07-20 DIAGNOSIS — G4733 Obstructive sleep apnea (adult) (pediatric): Secondary | ICD-10-CM | POA: Diagnosis not present

## 2024-07-20 DIAGNOSIS — Z794 Long term (current) use of insulin: Secondary | ICD-10-CM

## 2024-07-20 DIAGNOSIS — R413 Other amnesia: Secondary | ICD-10-CM

## 2024-07-20 DIAGNOSIS — I639 Cerebral infarction, unspecified: Secondary | ICD-10-CM | POA: Diagnosis not present

## 2024-07-20 NOTE — Telephone Encounter (Signed)
 FYI Only or Action Required?: FYI only for provider: UC recommended.  Patient was last seen in primary care on 06/24/2024 by Mercer Clotilda SAUNDERS, MD.  Called Nurse Triage reporting Advice Only.  Triage Disposition: Call PCP When Office is Open  Patient/caregiver understands and will follow disposition?: Yes   Copied from CRM #[id number]. Topic: Clinical - Red Word Triage >> Jul 20, 2024  9:53 AM Treva T wrote: [facility] that prompted transfer to Nurse Triage: Pt daughter, [name], calling on behalf of pt, states nose packing was placed by the ER on 07/16/24, and 07/17/24 due to unknown heavy nose bleeds, and is taking blood thinners twice a day for stroke.  States per ED orders, packing needs to be removed today.   Pt is currently having discomfort/pain with the packing, as packing is hanging out, and is in fear that he may start having nosebleed again if packing is removed incorrectly.   Requesting an appt with PCP today for removal and to evaluate reason for persistent nose bleeds, due to him currently on blood thinner medication.  Pt daughter, [name] (DPR verified) Ph. [phone] Reason for Disposition  [1] Caller requesting NON-URGENT health information AND [2] PCP's office is the best resource  Answer Assessment - Initial Assessment Questions 1. REASON FOR CALL: What is the main reason for your call? or How can I best help you?     Pt's daughter called in, pt on speaker phone, stating that patient had nasal packing placed 12/05 and it is due to be removed today but d/t pt being on blood thinner, they are worried to remove it at home. Pt is reporting packing to be uncomfortable at this time. Attempted to contact [facility] for nurse visit but office will not remove as they did not place packing. Referred pt and daughter to have packing removed at [facility] d/t best setting if epistaxis does occur. Patient agrees with plan of care, and will call back if anything changes, or if symptoms worsen.  Protocols  used: Information Only Call - No Triage-A-AH

## 2024-07-20 NOTE — Telephone Encounter (Signed)
 Per Dr Mercer, patient would need to go back to The ED or have an ENT referral placed, patient was contacted, patient is aware, patient is going back to ED

## 2024-07-20 NOTE — Progress Notes (Signed)
 Synopsis: Referred in 07/2024 for sleep apnea management by [name], FNP  Subjective:   PATIENT ID: [name] [name] GENDER: male DOB: Sep 19, 1955, MRN: [id number]  Chief Complaint  Patient presents with   Consult    Sleep. Referred by Harlene Ivory, NP. Pt with OSA- tried using CPAP for approx 6 months and unable to tolerate pressure and mask.     HPI  [name] [name] is a 68 y.o. male with HTN, T2DM, pAFib, CAD, HFrecEF s/p cardioMEMS device, and recent multifocal small embolic strokes attributed to AFib (on Eliquis ), CKD, and existing diagnosis of sleep apnea who presents today at request of his cardiologist for management of CSA in setting of recurrent atrial arrhythmias.  With respect to the patient's sleep apnea phenotype, he was initially characterized as having CSA based on his initial in-lab PSG performed in 2016, however subsequent studies have not validated this and instead have demonstrated predominantly an obstructive phenotype with a reportedly good response to CPAP 10 cm H2O.  He currently is not using his device because his full face mask is too uncomfortable. He has not tried an alternative mask type such as nasal pillows mask but would like to try as he knows he benefits from using CPAP and that his risk of recurrent atrial arrhythmias is increased without use of PAP.  Reports he quit smoking marijuana about 2 months ago. He quit smoking cigarettes in 1985.  Separate to the concern regarding his sleep apnea, he also asks me to remove a Rhinorocket which was placed in his R nare on 12/5 in the [facility] ED for epistaxis. He held his Eliquis  for 24 hrs then resumed it as per the ED's instructions.  Globally, the patient is somewhat frail. He notes he is not steady on his feet even during the daytime, and he definitely experiences unsteadiness when he gets up in the middle of the night to urinate.   Pulm Questionnaires:     07/20/2024    3:00 PM   Results of the Epworth flowsheet  Sitting and reading 3  Watching TV 3  Sitting, inactive in a public place (e.g. a theatre or a meeting) 3  As a passenger in a car for an hour without a break 3  Lying down to rest in the afternoon when circumstances permit 3  Sitting and talking to someone 2  Sitting quietly after a lunch without alcohol 3  In a car, while stopped for a few minutes in traffic 3  Total score 23     Past Medical History:  Diagnosis Date   Anxiety    CAD (coronary artery disease), native coronary artery    a. Nonobstructive CAD by cath 2013 - diffuse distal and branch vessel CAD, no severe disease in the major coronaries, LV mild global hypokinesis, EF 45%. b. ETT-Sestamibi 5/14: EF 31%, small fixed inferior defect with no ischemia.   Chronic CHF (HCC)    a. Mixed ICM/NICM (?EtOH). EF 35% in 2008. Echo 5/13: EF 60-65%, mod LVH, EF 45% on V gram in 12/2011. EF 12/2012: EF 50-55%, mild LVH, inferobasal HK, mild MR. ETT-Ses 5/14 EF 41%. Cardiac MRI 5/14: EF 44%, mild global HK, subepicardial delayed enhancement in nonspecific RV insertion pattern.   COLONIC POLYPS, HX OF 12/30/2006   Gout    H/O atrial flutter 08/13/2005   a. Ablations in 2007, 2008.   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   left shoulder   PAF (paroxysmal atrial fibrillation) (HCC)    On coumadin    Sleep apnea    suppose to send mask but they never did (05/03/2015)   Type II diabetes mellitus (HCC)      Family History  Problem Relation Age of Onset   Diabetes Mother    Hypertension Mother    Heart attack Neg Hx    Stroke Neg Hx      Past Surgical History:   Procedure Laterality Date   A FLUTTER ABLATION  2007, 2008   catheter ablation    CARDIAC CATHETERIZATION N/A 05/03/2015   Procedure: Right/Left Heart Cath and Coronary Angiography;  Surgeon: Ezra GORMAN Shuck, MD;  Location: [name] INVASIVE CV LAB;  Service: Cardiovascular;  Laterality: N/A;   CARDIAC CATHETERIZATION N/A 05/03/2015   Procedure: Coronary Stent Intervention;  Surgeon: Candyce GORMAN Reek, MD;  Location: [facility] INVASIVE CV LAB;  Service: Cardiovascular;  Laterality: N/A;   CARDIAC CATHETERIZATION N/A 11/04/2015   Procedure: Left Heart Cath and Coronary Angiography;  Surgeon: Ezra GORMAN Shuck, MD;  Location: [facility] INVASIVE CV LAB;  Service: Cardiovascular;  Laterality: N/A;   CARDIOVERSION N/A 08/24/2015   Procedure: CARDIOVERSION;  Surgeon: Aleene JINNY Passe, MD;  Location: [facility] ENDOSCOPY;  Service: Cardiovascular;  Laterality: N/A;   CARDIOVERSION N/A 12/14/2022   Procedure: CARDIOVERSION;  Surgeon: Shuck Ezra GORMAN, MD;  Location: [facility] INVASIVE CV LAB;  Service: Cardiovascular;  Laterality: N/A;   CARDIOVERSION N/A 10/02/2023   Procedure: CARDIOVERSION;  Surgeon: Shuck Ezra GORMAN, MD;  Location: [facility] INVASIVE CV LAB;  Service: Cardiovascular;  Laterality: N/A;   CHOLECYSTECTOMY N/A 02/07/2016   Procedure: LAPAROSCOPIC CHOLECYSTECTOMY WITH  INTRAOPERATIVE CHOLANGIOGRAM;  Surgeon: Camellia Blush, MD;  Location: [facility] ORS;  Service: General;  Laterality: N/A;   COLONOSCOPY WITH PROPOFOL  N/A 04/28/2019   Procedure: COLONOSCOPY WITH PROPOFOL ;  Surgeon: Saintclair Jasper, MD;  Location: [facility] ENDOSCOPY;  Service: Gastroenterology;  Laterality: N/A;   CORONARY ANGIOPLASTY  12/05/01   ESOPHAGOGASTRODUODENOSCOPY (EGD) WITH PROPOFOL  N/A 04/27/2019   Procedure: ESOPHAGOGASTRODUODENOSCOPY (EGD) WITH PROPOFOL ;  Surgeon: Saintclair Jasper, MD;  Location: [facility] ENDOSCOPY;  Service: Gastroenterology;  Laterality: N/A;   GIVENS CAPSULE STUDY N/A 06/29/2019   Procedure: GIVENS CAPSULE STUDY;  Surgeon: Dianna Specking, MD;  Location: [facility] ENDOSCOPY;   Service: Endoscopy;  Laterality: N/A;   LEFT HEART CATH AND CORONARY ANGIOGRAPHY N/A 12/28/2016   Procedure: Left Heart Cath and Coronary Angiography;  Surgeon: Shuck Ezra GORMAN, MD;  Location: [facility] INVASIVE CV LAB;  Service: Cardiovascular;  Laterality: N/A;   LEFT HEART CATHETERIZATION WITH CORONARY ANGIOGRAM N/A 12/19/2011   Procedure: LEFT HEART CATHETERIZATION WITH CORONARY ANGIOGRAM;  Surgeon: Ezra GORMAN Shuck, MD;  Location: [facility] CATH LAB;  Service: Cardiovascular;  Laterality: N/A;   OSTEOCHONDROMA EXCISION Left 1972   took bone tumor off my shoulder   POLYPECTOMY  04/28/2019   Procedure: POLYPECTOMY;  Surgeon: Saintclair Jasper, MD;  Location: [facility] ENDOSCOPY;  Service: Gastroenterology;;   PRESSURE SENSOR/CARDIOMEMS N/A 02/01/2020   Procedure: PRESSURE SENSOR/CARDIOMEMS;  Surgeon: Shuck Ezra GORMAN, MD;  Location: [facility] INVASIVE CV LAB;  Service: Cardiovascular;  Laterality: N/A;   RIGHT HEART CATH N/A 02/01/2020   Procedure: RIGHT HEART CATH;  Surgeon: Shuck Ezra GORMAN, MD;  Location: [facility] INVASIVE CV LAB;  Service: Cardiovascular;  Laterality: N/A;   RIGHT/LEFT HEART CATH AND CORONARY ANGIOGRAPHY N/A 07/01/2019   Procedure: RIGHT/LEFT HEART CATH AND CORONARY ANGIOGRAPHY;  Surgeon: Rolan Ezra RAMAN, MD;  Location: [facility] INVASIVE CV LAB;  Service: Cardiovascular;  Laterality: N/A;   TEE WITHOUT CARDIOVERSION N/A 12/14/2022   Procedure: TRANSESOPHAGEAL ECHOCARDIOGRAM;  Surgeon: Rolan Ezra RAMAN, MD;  Location: [facility] INVASIVE CV LAB;  Service: Cardiovascular;  Laterality: N/A;   TRANSESOPHAGEAL ECHOCARDIOGRAM (CATH LAB) N/A 10/02/2023   Procedure: TRANSESOPHAGEAL ECHOCARDIOGRAM;  Surgeon: Rolan Ezra RAMAN, MD;  Location: [facility] INVASIVE CV LAB;  Service: Cardiovascular;  Laterality: N/A;    Social History   Socioeconomic History   Marital status: Widowed    Spouse name: Not on file   Number of children: 2   Years of education: 14   Highest education level: Some college, no degree  Occupational History    Employer:  UNEMPLOYED  Tobacco Use   Smoking status: Former    Current packs/day: 0.00    Average packs/day: 4.0 packs/day for 18.0 years (72.0 ttl pk-yrs)    Types: Cigarettes    Start date: 08/13/1965    Quit date: 08/14/1983    Years since quitting: 40.9   Smokeless tobacco: Never  Vaping Use   Vaping status: Never Used  Substance and Sexual Activity   Alcohol use: Yes    Alcohol/week: 16.0 standard drinks of alcohol    Types: 6 Cans of beer, 10 Shots of liquor per week    Comment: 2-3 beers watching sports; occasional glass of liquor; drinks heavily in football season   Drug use: Yes    Types: Marijuana    Comment: twice a week   Sexual activity: Never  Other Topics Concern   Not on file  Social History Narrative   Not on file   Social Drivers of Health   Financial Resource Strain: Low Risk  (07/23/2022)   Overall Financial Resource Strain (CARDIA)    Difficulty of Paying Living Expenses: Not hard at all  Food Insecurity: Unknown (06/09/2024)   Hunger Vital Sign    Worried About Running Out of Food in the Last Year: Never true    Ran Out of Food in the Last Year: Not on file  Transportation Needs: No Transportation Needs (06/09/2024)   PRAPARE - Administrator, Civil Service (Medical): No    Lack of Transportation (Non-Medical): No  Physical Activity: Inactive (07/23/2022)   Exercise Vital Sign    Days of Exercise per Week: 0 days    Minutes of Exercise per Session: 0 min  Stress: No Stress Concern Present (07/23/2022)   [employer] of Occupational Health - Occupational Stress Questionnaire    Feeling of Stress : Not at all  Social Connections: Socially Isolated (06/03/2024)   Social Connection and Isolation Panel    Frequency of Communication with Friends and Family: More than three times a week    Frequency of Social Gatherings with Friends and Family: Three times a week    Attends Religious Services: Never    Active Member of Clubs or Organizations: No     Attends Banker Meetings: Never    Marital Status: Widowed  Intimate Partner Violence: Not At Risk (06/03/2024)   Humiliation, Afraid, Rape, and Kick questionnaire    Fear of Current or Ex-Partner: No    Emotionally Abused: No    Physically Abused: No    Sexually Abused: No     Allergies  Allergen Reactions   Shrimp [Shellfish Allergy] Nausea And Vomiting   Ace Inhibitors Cough   Other Hives and Other (See Comments)    Patient reports developing hives after receiving some antibiotic given in 1980''s at [facility]. He does not know which antibiotic.     Outpatient Medications Prior to Visit  Medication Sig Dispense Refill   acetaminophen  (TYLENOL ) 500 MG tablet Take 1,000 mg by mouth every 6 (six) hours as needed (pain).     amLODipine  (NORVASC ) 10 MG tablet Take 1 tablet (10 mg total) by mouth daily. 90 tablet 0   apixaban  (ELIQUIS ) 5 MG TABS tablet Take 1 tablet (5 mg total) by mouth 2 (two) times daily. 180 tablet 1   atorvastatin  (LIPITOR ) 80 MG tablet Take 1 tablet (80 mg total) by mouth daily. 90 tablet 1   Blood Glucose Monitoring Suppl DEVI 1 each by Does not apply route in the morning, at noon, and at bedtime. May substitute to any manufacturer covered by patient's insurance. 1 each 0   Continuous Glucose Receiver (FREESTYLE LIBRE 3 READER) DEVI 1 Device by Does not apply route continuous. 2 each 11   Continuous Glucose Sensor (FREESTYLE LIBRE 3 SENSOR) MISC 1 Device by Does not apply route every 14 (fourteen) days. 6 each 1   eplerenone  (INSPRA ) 25 MG tablet Take 1 tablet (25 mg total) by mouth daily. 90 tablet 1   ezetimibe  (ZETIA ) 10 MG tablet Take 1 tablet (10 mg total) by mouth daily. 30 tablet 10   folic acid  (FOLVITE ) 1 MG tablet Take 1 tablet (1 mg total) by mouth daily. 90 tablet 0   Glucose Blood (BLOOD GLUCOSE TEST STRIPS) STRP 1 each by In Vitro route in the morning, at noon, and at bedtime. May substitute to any manufacturer covered by  patient's insurance. 100 strip 11   insulin  glargine (LANTUS  SOLOSTAR) 100 UNIT/ML Solostar Pen Inject 20 Units into the skin at bedtime. 6 mL 0   insulin  lispro (HUMALOG ) 100 UNIT/ML injection Inject 0.01 mLs (1 Units total) into the skin 3 (three) times daily with meals. Sliding scale 70-140=0 Units 141-170=1 Units 171-200=2 Units 201-230=3 Units 231-260=4 Units 261-290=5 Units 291-320=6 Units 231-350=7 Units 351-380=8 Units Over 380 10 units 2.7 mL 0   Insulin  Pen Needle 32G X 4 MM MISC Use as directed with Lantus . 100 each 0   isosorbide  mononitrate (IMDUR ) 120 MG 24 hr tablet Take 1 tablet (120 mg total) by mouth daily. 90 tablet 1   JARDIANCE  25 MG TABS tablet TAKE 1 TABLET BY MOUTH ONCE DAILY CONTACT OFFICE FOR APPOINTMENT 30 tablet 10   Lancets Misc. MISC 1 each by Does not apply route in the morning, at noon, and at bedtime. May substitute to any manufacturer covered by patient's insurance. 100 each 11   losartan  (COZAAR ) 50 MG tablet Take 1 tablet (50 mg total) by mouth daily. 90 tablet 3   metoprolol  succinate (TOPROL -XL) 25 MG 24 hr tablet Take 0.5 tablets (12.5 mg total) by mouth daily. 45 tablet 3   Multiple Vitamins-Minerals (MULTIVITAMIN WITH MINERALS) tablet Take 1 tablet by mouth daily. 90 tablet 0   nitroGLYCERIN  (NITROSTAT ) 0.4 MG SL tablet DISSOLVE 1 TABLET UNDER THE TONGUE AS NEEDED FOR CHEST PAIN EVERY 5 MINUTES UP TO 3 TIMES. IF NO RELIEF CALL 911. 25 tablet 11   polyethylene glycol powder (GLYCOLAX /MIRALAX ) 17 GM/SCOOP powder Dissolve 1 capful (17g) in 4-8 ounces of liquid and take by mouth daily as needed for mild constipation.  238 g 0   thiamine  (VITAMIN B1) 100 MG tablet Take 1 tablet (100 mg total) by mouth daily. 90 tablet 0   torsemide  (DEMADEX ) 20 MG tablet Take 2 tablets (40 mg total) by mouth daily as needed (as needed for weight gain >2 lbs in 24 hrs or > 5 lbs in a week , increased leg swelling, increased shortness of breath. May take another dose if symptoms persist  and contact provider). 180 tablet 0   No facility-administered medications prior to visit.    ROS   Objective:  Physical Exam Constitutional:      Appearance: Normal appearance.     Comments: Minimally overweight to normal weight.  HENT:     Head: Normocephalic and atraumatic.     Mouth/Throat:     Mouth: Mucous membranes are moist.     Pharynx: Oropharynx is clear. No oropharyngeal exudate or posterior oropharyngeal erythema.     Comments: Edentulous. MP I. No tongue scalloping. No blood visualized in the posterior oropharynx following removal of the rhinorocket from his R nare. Eyes:     General: No scleral icterus.    Conjunctiva/sclera: Conjunctivae normal.  Pulmonary:     Effort: Pulmonary effort is normal. No respiratory distress.     Breath sounds: Normal breath sounds. No stridor. No wheezing, rhonchi or rales.  Musculoskeletal:     Right lower leg: Edema present.     Left lower leg: Edema present.     Comments: Trace pitting edema - both lower extremities.  Neurological:     Mental Status: He is alert and oriented to person, place, and time.  Psychiatric:        Behavior: Behavior normal.        Thought Content: Thought content normal.      Vitals:   07/20/24 1539  BP: 126/74  Pulse: 76  SpO2: 96%  Weight: 196 lb (88.9 kg)  Height: 5' 9 (1.753 m)   96% On RA BMI Readings from Last 3 Encounters:  07/20/24 28.94 kg/m  07/20/24 26.45 kg/m  07/17/24 26.45 kg/m   Wt Readings from Last 3 Encounters:  07/20/24 196 lb (88.9 kg)  07/20/24 195 lb (88.5 kg)  07/17/24 195 lb (88.5 kg)     CBC    Component Value Date/Time   WBC 6.9 07/17/2024 2135   RBC 5.58 07/17/2024 2135   HGB 15.7 07/17/2024 2135   HGB 14.0 07/29/2017 1101   HCT 48.2 07/17/2024 2135   HCT 44.2 07/29/2017 1101   PLT 186 07/17/2024 2135   PLT 187 07/29/2017 1101   MCV 86.4 07/17/2024 2135   MCV 83 07/29/2017 1101   MCH 28.1 07/17/2024 2135   MCHC 32.6 07/17/2024 2135   RDW  14.3 07/17/2024 2135   RDW 16.8 (H) 07/29/2017 1101   LYMPHSABS 2.3 06/06/2024 1533   LYMPHSABS 2.2 07/29/2017 1101   MONOABS 0.8 06/06/2024 1533   EOSABS 0.1 06/06/2024 1533   EOSABS 0.4 07/29/2017 1101   BASOSABS 0.1 06/06/2024 1533   BASOSABS 0.1 07/29/2017 1101    Chest Imaging: CT Chest / Abd / Pelvis (06/06/2024): Interlobular septal thickening lower lobe/infrahilar predominant, favoring mild interstitial edema. Small right and trace left pleural effusions. Left lower lobe calcification/granuloma (image 45), new from prior, benign. No pneumothorax.  Pulmonary Functions Testing Results:     No data to display          FeNO: N/A  Pathology: N/A  Echocardiogram:  TTE 06/08/2024: 1. Left ventricular ejection fraction, by  estimation, is 55 to 60%. The  left ventricle has normal function. The left ventricle has no regional  wall motion abnormalities. There is mild left ventricular hypertrophy.  Left ventricular diastolic parameters  are indeterminate.   2. Right ventricular systolic function is normal. The right ventricular  size is normal. There is moderately elevated pulmonary artery systolic  pressure.   3. Left atrial size was moderately dilated.   4. Mild mitral valve regurgitation.   5. The aortic valve is tricuspid. Aortic valve regurgitation is mild.  Aortic valve sclerosis/calcification is present, without any evidence of  aortic stenosis.   6. The inferior vena cava is normal in size with <50% respiratory  variability, suggesting right atrial pressure of 8 mmHg.   Heart Catheterization: N/A  Sleep Studies:  PSG (11/25/2014): - Mild obstructive sleep apnea/hypopnea syndrome with greater than 50% of apneas as central apneas. The AHI was 9.4 events per hour. Most events occurred in the supine position during NREM sleep. - There were a total of 29 apneas, of which, 2 were obstructive, 19 were central and 8 mixed apneas.  There were 15 hypopneas. -  Oxygen desaturations as low as 87% with time spent with oxygen saturations below 88% was 25 minutes. - CPAP Titration was recommended.  PSG (09/23/2016): - No significant obstructive sleep apnea occurred during this study (AHI = 5/h). - No significant central sleep apnea occurred during this study (CAI = 0.6/h). - Mild oxygen desaturation was noted during this study (Min O2 = 85.00%). - The patient snored with Loud snoring volume. - No cardiac abnormalities were noted during this study. - Severe periodic limb movements of sleep occurred during the study. No significant associated arousals. - Diagnosis: Nocturnal hypoxemia. - Recommendations: Patient has significant OSA during REM sleep only (70/hr) and significant hypoxemia during the events.  Therefore recommend CPAP titration in lab.  CPAP Titration (11/12/2016): - Optimal PAP pressure was 10 cm H2O.  HST (09/09/2020): - WatchPAT study - pAHI 15.8 /hr (central events were only 2.2/hr on this study) - SpO2 desaturations to 85% - T88% 19.2 minutes - Impression: Moderate OSA     Assessment & Plan:     ICD-10-CM   1. Obstructive sleep apnea  G47.33 Ambulatory Referral for DME    2. Heart failure with recovered ejection fraction (HFrecEF) (HCC)  I50.20     3. Encounter for removal of nasal packing  Z48.00       Discussion:  - Rhinorocket in the patient's R nare was deflated using a Luer-lock syringe, followed by removal of the device. There was no bleeding following removal. Oropharynx was inspected (with good visualization of the back of the throat), confirming absence of any blood. Advised patient to try to avoid blowing his nose or using CPAP for the next 2-3 days. Anticipatory guidance given to the patient and his daughter who accompanied him regarding returning to the ED if bleeding recurs.  - The patient acknowledges that he benefits from use of CPAP in terms of EDS reduction, as well as a reduced risk of recurrent cardiac  arrhythmia. He wishes to resume use of CPAP and thinks a better mask fit would significantly help with this.  - Will ask [employer] to send the patient a new P-10 nasal pillows mask + chin strap to use with his existing CPAP device.  - Advised patient that resuming CPAP use with this new mask will provide additional data I can then use to determine whether he might benefit from  an in-lab titration. Given his multiple significant multiple conditions and relative frailty, I would like to try to avoid him spending a night in the hospital sleep lab if at all possible. Patient and family in agreement with this approach.  - Given his history of HFrecEF (and now small embolic cerebral infarcts), he remains at risk of experiencing central events in addition to obstructive events. Further intolerance of CPAP may suggest an under-appreciated contribution from central events which only an in-lab titration will truly characterize.  Follow up: 3 months, or earlier if not tolerating CPAP despite change in mask.   Current Outpatient Medications:    acetaminophen  (TYLENOL ) 500 MG tablet, Take 1,000 mg by mouth every 6 (six) hours as needed (pain)., Disp: , Rfl:    amLODipine  (NORVASC ) 10 MG tablet, Take 1 tablet (10 mg total) by mouth daily., Disp: 90 tablet, Rfl: 0   apixaban  (ELIQUIS ) 5 MG TABS tablet, Take 1 tablet (5 mg total) by mouth 2 (two) times daily., Disp: 180 tablet, Rfl: 1   atorvastatin  (LIPITOR ) 80 MG tablet, Take 1 tablet (80 mg total) by mouth daily., Disp: 90 tablet, Rfl: 1   Blood Glucose Monitoring Suppl DEVI, 1 each by Does not apply route in the morning, at noon, and at bedtime. May substitute to any manufacturer covered by patient's insurance., Disp: 1 each, Rfl: 0   Continuous Glucose Receiver (FREESTYLE LIBRE 3 READER) DEVI, 1 Device by Does not apply route continuous., Disp: 2 each, Rfl: 11   Continuous Glucose Sensor (FREESTYLE LIBRE 3 SENSOR) MISC, 1 Device by Does not apply route  every 14 (fourteen) days., Disp: 6 each, Rfl: 1   eplerenone  (INSPRA ) 25 MG tablet, Take 1 tablet (25 mg total) by mouth daily., Disp: 90 tablet, Rfl: 1   ezetimibe  (ZETIA ) 10 MG tablet, Take 1 tablet (10 mg total) by mouth daily., Disp: 30 tablet, Rfl: 10   folic acid  (FOLVITE ) 1 MG tablet, Take 1 tablet (1 mg total) by mouth daily., Disp: 90 tablet, Rfl: 0   Glucose Blood (BLOOD GLUCOSE TEST STRIPS) STRP, 1 each by In Vitro route in the morning, at noon, and at bedtime. May substitute to any manufacturer covered by patient's insurance., Disp: 100 strip, Rfl: 11   insulin  glargine (LANTUS  SOLOSTAR) 100 UNIT/ML Solostar Pen, Inject 20 Units into the skin at bedtime., Disp: 6 mL, Rfl: 0   insulin  lispro (HUMALOG ) 100 UNIT/ML injection, Inject 0.01 mLs (1 Units total) into the skin 3 (three) times daily with meals. Sliding scale 70-140=0 Units 141-170=1 Units 171-200=2 Units 201-230=3 Units 231-260=4 Units 261-290=5 Units 291-320=6 Units 231-350=7 Units 351-380=8 Units Over 380 10 units, Disp: 2.7 mL, Rfl: 0   Insulin  Pen Needle 32G X 4 MM MISC, Use as directed with Lantus ., Disp: 100 each, Rfl: 0   isosorbide  mononitrate (IMDUR ) 120 MG 24 hr tablet, Take 1 tablet (120 mg total) by mouth daily., Disp: 90 tablet, Rfl: 1   JARDIANCE  25 MG TABS tablet, TAKE 1 TABLET BY MOUTH ONCE DAILY CONTACT OFFICE FOR APPOINTMENT, Disp: 30 tablet, Rfl: 10   Lancets Misc. MISC, 1 each by Does not apply route in the morning, at noon, and at bedtime. May substitute to any manufacturer covered by patient's insurance., Disp: 100 each, Rfl: 11   losartan  (COZAAR ) 50 MG tablet, Take 1 tablet (50 mg total) by mouth daily., Disp: 90 tablet, Rfl: 3   metoprolol  succinate (TOPROL -XL) 25 MG 24 hr tablet, Take 0.5 tablets (12.5 mg total) by mouth daily.,  Disp: 45 tablet, Rfl: 3   Multiple Vitamins-Minerals (MULTIVITAMIN WITH MINERALS) tablet, Take 1 tablet by mouth daily., Disp: 90 tablet, Rfl: 0   nitroGLYCERIN  (NITROSTAT ) 0.4 MG SL  tablet, DISSOLVE 1 TABLET UNDER THE TONGUE AS NEEDED FOR CHEST PAIN EVERY 5 MINUTES UP TO 3 TIMES. IF NO RELIEF CALL 911., Disp: 25 tablet, Rfl: 11   polyethylene glycol powder (GLYCOLAX /MIRALAX ) 17 GM/SCOOP powder, Dissolve 1 capful (17g) in 4-8 ounces of liquid and take by mouth daily as needed for mild constipation., Disp: 238 g, Rfl: 0   thiamine  (VITAMIN B1) 100 MG tablet, Take 1 tablet (100 mg total) by mouth daily., Disp: 90 tablet, Rfl: 0   torsemide  (DEMADEX ) 20 MG tablet, Take 2 tablets (40 mg total) by mouth daily as needed (as needed for weight gain >2 lbs in 24 hrs or > 5 lbs in a week , increased leg swelling, increased shortness of breath. May take another dose if symptoms persist and contact provider)., Disp: 180 tablet, Rfl: 0   Lamar Dales, MD Pulmonary, Critical Care & Sleep Medicine [facility] 07/20/24 4:58 PM

## 2024-07-20 NOTE — Patient Instructions (Signed)
  Please schedule a follow up appointment with me when you check out today.  ----------   Please start using the new mask and chin strap I have ordered for you. You will receive this from [facility] shortly.

## 2024-07-20 NOTE — Progress Notes (Addendum)
 "   PATIENT: [name] [name] DOB: 08/03/1956  REASON FOR VISIT: follow up HISTORY FROM: patient PRIMARY NEUROLOGIST: Dr. Rosemarie  Chief Complaint  Patient presents with   Cerebrovascular Accident    Rm 4 with daughter Pt is well, reports        HISTORY OF PRESENT ILLNESS: Today 08/17/2024   [name] [name] is a 68 y.o. male here for hospital follow-up d/t Multifocal bilateral small infarct embolic likely secondary to PAF not compliant with Eliquis . Returns today for follow-up.  He reports overall he feels back to his baseline.  He does report feeling confused at times and has trouble recalling certain words.  Denies any weakness.  No trouble chewing or swallowing food.  He is currently living with his daughter.  Not driving.  He has had a follow-up with his PCP.  Currently on Lipitor  for his cholesterol.  On insulin  and Jardiance  for diabetes.  He has an appointment this afternoon with pulmonology for CPAP.  He had to go to the emergency room last week for next week.  He has a Rhino balloon in the right nare.  This is supposed be removed today.  Daughter has noted that he has been more irritable and mood changes since his stroke.  His daughter feels that due to his memory, he should not be driving.  He currently is not operating a motor vehicle.  He returns today for an evaluation.  Imaging:   MRI BRAIN: IMPRESSION: 1. Several punctate acute and subacute infarcts involving both cerebral hemispheres and the cerebellum. Most acute focus in the left centrum semiovale. Acute on chronic synchronous small vessel disease favored, rather than a recent embolic event. 2. No associated hemorrhage or intracranial mass effect. 3. Underlying advanced chronic microvascular ischemic changes.  MRA BRAIN:IMPRESSION: 1. 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 The basilar artery and posterior cerebral arteries appear normal.   HISTORY (copied from  Hospital)[name] is a 68 y.o. male with history of CAD, A-fib noncompliant with Eliquis , alcohol abuse, hypertension, diabetes, OSA admitted for SOB and orthopnea.  Found to have hypertensive emergency, AKI, CHF.  No TNK given due to outside window.     Stroke: Multifocal bilateral small infarct embolic likely secondary to PAF not compliant with Eliquis  CT no acute finding MRI brain small left cerebellum, left periventricular white matter, bilateral MCA/ACA watershed infarcts MRA occlusion of the nondominant left VA at the dural margin. Carotid Doppler unremarkable 2D Echo pending LDL 65 HgbA1c 8.4 UDS positive for THC Eliquis  for VTE prophylaxis Eliquis  (apixaban ) daily prior to admission but not compliant, now on Eliquis  (apixaban ) daily.  Patient counseled to be compliant with his antithrombotic medications Ongoing aggressive stroke risk factor management Therapy recommendations: Pending Disposition: Pending   PAF Noncompliant with medication at home Now on amiodarone  and Eliquis    AKI Creatinine 1.14-1.31-1.74-1 0.91-1.46 On IV fluid Encourage p.o. intake   Diabetes HgbA1c 8.4 goal < 7.0 Uncontrolled Currently on insulin  and Jardiance  CBG monitoring SSI DM education and close PCP follow up   Hypertension Stable on the hand On Corgard 25, metoprolol  25, Imdur  Long term BP goal normotensive   Hyperlipidemia Home meds: Lipitor  80 and Zetia , not compliant with medication LDL 65, goal < 70 Now on Lipitor  80 Continue statin at discharge   Other Stroke Risk Factors Advanced age ETOH abuse, on B1/FA/MVI, educated on limitation of alcohol use THC abuse, cessation education provided CC Coronary artery disease CHF with SOB and  orthopnea  Obstructive sleep apnea, on CPAP at home    REVIEW OF SYSTEMS: Out of a complete 14 system review of symptoms, the patient complains only of the following symptoms, and all other reviewed systems are  negative.  ALLERGIES: Allergies  Allergen Reactions   Shrimp [Shellfish Allergy] Nausea And Vomiting   Ace Inhibitors Cough   Other Hives and Other (See Comments)    Patient reports developing hives after receiving some antibiotic given in 1980''s at [facility]. He does not know which antibiotic.    HOME MEDICATIONS: Outpatient Medications Prior to Visit  Medication Sig Dispense Refill   acetaminophen  (TYLENOL ) 500 MG tablet Take 1,000 mg by mouth every 6 (six) hours as needed (pain).     amLODipine  (NORVASC ) 10 MG tablet Take 1 tablet (10 mg total) by mouth daily. 90 tablet 0   apixaban  (ELIQUIS ) 5 MG TABS tablet Take 1 tablet (5 mg total) by mouth 2 (two) times daily. 180 tablet 1   atorvastatin  (LIPITOR ) 80 MG tablet Take 1 tablet (80 mg total) by mouth daily. 90 tablet 1   Blood Glucose Monitoring Suppl DEVI 1 each by Does not apply route in the morning, at noon, and at bedtime. May substitute to any manufacturer covered by patient's insurance. 1 each 0   Continuous Glucose Receiver (FREESTYLE LIBRE 3 READER) DEVI 1 Device by Does not apply route continuous. 2 each 11   Continuous Glucose Sensor (FREESTYLE LIBRE 3 SENSOR) MISC 1 Device by Does not apply route every 14 (fourteen) days. 6 each 1   eplerenone  (INSPRA ) 25 MG tablet Take 1 tablet (25 mg total) by mouth daily. 90 tablet 1   ezetimibe  (ZETIA ) 10 MG tablet Take 1 tablet (10 mg total) by mouth daily. 30 tablet 10   folic acid  (FOLVITE ) 1 MG tablet Take 1 tablet (1 mg total) by mouth daily. 90 tablet 0   Glucose Blood (BLOOD GLUCOSE TEST STRIPS) STRP 1 each by In Vitro route in the morning, at noon, and at bedtime. May substitute to any manufacturer covered by patient's insurance. 100 strip 11   insulin  glargine (LANTUS  SOLOSTAR) 100 UNIT/ML Solostar Pen Inject 20 Units into the skin at bedtime. 6 mL 0   insulin  lispro (HUMALOG ) 100 UNIT/ML injection Inject 0.01 mLs (1 Units total) into the skin 3 (three) times daily  with meals. Sliding scale 70-140=0 Units 141-170=1 Units 171-200=2 Units 201-230=3 Units 231-260=4 Units 261-290=5 Units 291-320=6 Units 231-350=7 Units 351-380=8 Units Over 380 10 units 2.7 mL 0   Insulin  Pen Needle 32G X 4 MM MISC Use as directed with Lantus . 100 each 0   isosorbide  mononitrate (IMDUR ) 120 MG 24 hr tablet Take 1 tablet (120 mg total) by mouth daily. 90 tablet 1   Lancets Misc. MISC 1 each by Does not apply route in the morning, at noon, and at bedtime. May substitute to any manufacturer covered by patient's insurance. 100 each 11   losartan  (COZAAR ) 50 MG tablet Take 1 tablet (50 mg total) by mouth daily. 90 tablet 3   Multiple Vitamins-Minerals (MULTIVITAMIN WITH MINERALS) tablet Take 1 tablet by mouth daily. 90 tablet 0   nitroGLYCERIN  (NITROSTAT ) 0.4 MG SL tablet DISSOLVE 1 TABLET UNDER THE TONGUE AS NEEDED FOR CHEST PAIN EVERY 5 MINUTES UP TO 3 TIMES. IF NO RELIEF CALL 911. 25 tablet 11   thiamine  (VITAMIN B1) 100 MG tablet Take 1 tablet (100 mg total) by mouth daily. 90 tablet 0   torsemide  (DEMADEX ) 20 MG  tablet Take 2 tablets (40 mg total) by mouth daily as needed (as needed for weight gain >2 lbs in 24 hrs or > 5 lbs in a week , increased leg swelling, increased shortness of breath. May take another dose if symptoms persist and contact provider). 180 tablet 0   JARDIANCE  25 MG TABS tablet TAKE 1 TABLET BY MOUTH ONCE DAILY CONTACT OFFICE FOR APPOINTMENT 30 tablet 10   metoprolol  succinate (TOPROL -XL) 25 MG 24 hr tablet Take 0.5 tablets (12.5 mg total) by mouth daily. 45 tablet 3   polyethylene glycol powder (GLYCOLAX /MIRALAX ) 17 GM/SCOOP powder Dissolve 1 capful (17g) in 4-8 ounces of liquid and take by mouth daily as needed for mild constipation. 238 g 0   No facility-administered medications prior to visit.    PAST MEDICAL HISTORY: Past Medical History:  Diagnosis Date   Anxiety    CAD (coronary artery disease), native coronary artery    a. Nonobstructive CAD by cath  2013 - diffuse distal and branch vessel CAD, no severe disease in the major coronaries, LV mild global hypokinesis, EF 45%. b. ETT-Sestamibi 5/14: EF 31%, small fixed inferior defect with no ischemia.   Chronic CHF (HCC)    a. Mixed ICM/NICM (?EtOH). EF 35% in 2008. Echo 5/13: EF 60-65%, mod LVH, EF 45% on V gram in 12/2011. EF 12/2012: EF 50-55%, mild LVH, inferobasal HK, mild MR. ETT-Ses 5/14 EF 41%. Cardiac MRI 5/14: EF 44%, mild global HK, subepicardial delayed enhancement in nonspecific RV insertion pattern.   COLONIC POLYPS, HX OF 12/30/2006   Gout    H/O atrial flutter 08/13/2005   a. Ablations in 2007, 2008.   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   PAF (paroxysmal atrial fibrillation) (HCC)    On coumadin    Sleep apnea    suppose to send mask but they never did (05/03/2015)   Type II diabetes mellitus (HCC)     PAST SURGICAL HISTORY: Past Surgical History:  Procedure Laterality Date   A FLUTTER ABLATION  2007, 2008   catheter ablation    CARDIAC CATHETERIZATION N/A 05/03/2015   Procedure: Right/Left Heart Cath and Coronary Angiography;  Surgeon: Ezra GORMAN Shuck, MD;  Location: [facility] INVASIVE CV LAB;  Service: Cardiovascular;  Laterality: N/A;   CARDIAC CATHETERIZATION N/A 05/03/2015   Procedure: Coronary Stent Intervention;  Surgeon: Candyce GORMAN Reek, MD;  Location: [facility] INVASIVE CV LAB;  Service: Cardiovascular;  Laterality: N/A;   CARDIAC CATHETERIZATION N/A 11/04/2015   Procedure: Left Heart Cath and Coronary Angiography;  Surgeon: Ezra GORMAN Shuck, MD;  Location: [facility] INVASIVE CV LAB;  Service: Cardiovascular;  Laterality: N/A;   CARDIOVERSION N/A 08/24/2015    Procedure: CARDIOVERSION;  Surgeon: Aleene JINNY Passe, MD;  Location: [facility] ENDOSCOPY;  Service: Cardiovascular;  Laterality: N/A;   CARDIOVERSION N/A 12/14/2022   Procedure: CARDIOVERSION;  Surgeon: Shuck Ezra GORMAN, MD;  Location: [facility] INVASIVE CV LAB;  Service: Cardiovascular;  Laterality: N/A;   CARDIOVERSION N/A 10/02/2023   Procedure: CARDIOVERSION;  Surgeon: Shuck Ezra GORMAN, MD;  Location: [facility] INVASIVE CV LAB;  Service: Cardiovascular;  Laterality: N/A;   CHOLECYSTECTOMY N/A 02/07/2016   Procedure: LAPAROSCOPIC CHOLECYSTECTOMY WITH  INTRAOPERATIVE CHOLANGIOGRAM;  Surgeon: Camellia Blush, MD;  Location: [facility] ORS;  Service: General;  Laterality: N/A;   COLONOSCOPY WITH PROPOFOL  N/A 04/28/2019   Procedure: COLONOSCOPY WITH PROPOFOL ;  Surgeon: Saintclair Jasper, MD;  Location: [facility] ENDOSCOPY;  Service: Gastroenterology;  Laterality: N/A;   CORONARY ANGIOPLASTY  12/05/01   ESOPHAGOGASTRODUODENOSCOPY (EGD) WITH PROPOFOL  N/A 04/27/2019   Procedure: ESOPHAGOGASTRODUODENOSCOPY (EGD) WITH PROPOFOL ;  Surgeon: Saintclair Jasper, MD;  Location: [facility] ENDOSCOPY;  Service: Gastroenterology;  Laterality: N/A;   GIVENS CAPSULE STUDY N/A 06/29/2019   Procedure: GIVENS CAPSULE STUDY;  Surgeon: Dianna Specking, MD;  Location: [facility] ENDOSCOPY;  Service: Endoscopy;  Laterality: N/A;   LEFT HEART CATH AND CORONARY ANGIOGRAPHY N/A 12/28/2016   Procedure: Left Heart Cath and Coronary Angiography;  Surgeon: Rolan Ezra RAMAN, MD;  Location: [facility] INVASIVE CV LAB;  Service: Cardiovascular;  Laterality: N/A;   LEFT HEART CATHETERIZATION WITH CORONARY ANGIOGRAM N/A 12/19/2011   Procedure: LEFT HEART CATHETERIZATION WITH CORONARY ANGIOGRAM;  Surgeon: Ezra RAMAN Rolan, MD;  Location: [facility] CATH LAB;  Service: Cardiovascular;  Laterality: N/A;   OSTEOCHONDROMA EXCISION Left 1972   took bone tumor off my shoulder   POLYPECTOMY  04/28/2019   Procedure: POLYPECTOMY;  Surgeon: Saintclair Jasper, MD;  Location: [facility] ENDOSCOPY;  Service: Gastroenterology;;   PRESSURE  SENSOR/CARDIOMEMS N/A 02/01/2020   Procedure: PRESSURE SENSOR/CARDIOMEMS;  Surgeon: Rolan Ezra RAMAN, MD;  Location: [facility] INVASIVE CV LAB;  Service: Cardiovascular;  Laterality: N/A;   RIGHT HEART CATH N/A 02/01/2020   Procedure: RIGHT HEART CATH;  Surgeon: Rolan Ezra RAMAN, MD;  Location: [facility] INVASIVE CV LAB;  Service: Cardiovascular;  Laterality: N/A;   RIGHT/LEFT HEART CATH AND CORONARY ANGIOGRAPHY N/A 07/01/2019   Procedure: RIGHT/LEFT HEART CATH AND CORONARY ANGIOGRAPHY;  Surgeon: Rolan Ezra RAMAN, MD;  Location: [facility] INVASIVE CV LAB;  Service: Cardiovascular;  Laterality: N/A;   TEE WITHOUT CARDIOVERSION N/A 12/14/2022   Procedure: TRANSESOPHAGEAL ECHOCARDIOGRAM;  Surgeon: Rolan Ezra RAMAN, MD;  Location: [facility] INVASIVE CV LAB;  Service: Cardiovascular;  Laterality: N/A;   TRANSESOPHAGEAL ECHOCARDIOGRAM (CATH LAB) N/A 10/02/2023   Procedure: TRANSESOPHAGEAL ECHOCARDIOGRAM;  Surgeon: Rolan Ezra RAMAN, MD;  Location: [facility] INVASIVE CV LAB;  Service: Cardiovascular;  Laterality: N/A;    FAMILY HISTORY: Family History  Problem Relation Age of Onset   Diabetes Mother    Hypertension Mother    Heart attack Neg Hx    Stroke Neg Hx     SOCIAL HISTORY: Social History   Socioeconomic History   Marital status: Widowed    Spouse name: Not on file   Number of children: 2   Years of education: 14   Highest education level: Some college, no degree  Occupational History    Employer: UNEMPLOYED  Tobacco Use   Smoking status: Former    Current packs/day: 0.00    Average packs/day: 4.0 packs/day for 18.0 years (72.0 ttl pk-yrs)    Types: Cigarettes    Start date: 08/13/1965    Quit date: 08/14/1983    Years since quitting: 41.0   Smokeless tobacco: Never  Vaping Use   Vaping status: Never Used  Substance and Sexual Activity   Alcohol use: Yes    Alcohol/week: 16.0 standard drinks of alcohol    Types: 6 Cans of beer, 10 Shots of liquor per week    Comment: 2-3 beers watching sports; occasional glass of  liquor; drinks heavily in football season   Drug use: Yes    Types: Marijuana    Comment: twice a week   Sexual activity: Never  Other Topics Concern   Not on file  Social History Narrative   Not on file   Social Drivers of Health   Tobacco Use: Medium Risk (08/11/2024)   Patient History    Smoking Tobacco Use: Former    Smokeless Tobacco Use: Never    Passive Exposure: Not on file  Financial Resource Strain: Low Risk (07/23/2022)   Overall Financial Resource Strain (CARDIA)    Difficulty of Paying Living Expenses: Not hard at all  Food Insecurity: Unknown (06/09/2024)   Epic    Worried About Programme Researcher, Broadcasting/film/video in the Last Year: Never true    [employer] of Food in the Last Year: Not on file  Transportation Needs: No Transportation Needs (06/09/2024)   Epic    Lack of Transportation (Medical): No    Lack of Transportation (Non-Medical): No  Physical Activity: Inactive (07/23/2022)   Exercise Vital Sign    Days of Exercise per Week: 0 days    Minutes of Exercise per Session: 0 min  Stress: No Stress Concern Present (07/23/2022)   [employer] of Occupational Health - Occupational Stress Questionnaire    Feeling of Stress : Not at all  Social Connections: Socially Isolated (06/03/2024)   Social Connection and Isolation Panel    Frequency of Communication with Friends and Family: More than three times a week    Frequency of Social Gatherings with Friends and Family: Three times a week    Attends Religious Services: Never    Active Member of Clubs or Organizations: No    Attends Banker Meetings: Never    Marital Status: Widowed  Intimate Partner Violence: Not At Risk (06/03/2024)   Epic    Fear of Current or Ex-Partner: No    Emotionally Abused: No    Physically Abused: No    Sexually Abused: No  Depression (PHQ2-9): Low Risk (06/24/2024)   Depression (PHQ2-9)    PHQ-2 Score: 3  Alcohol Screen: Low Risk (07/23/2022)   Alcohol Screen    Last Alcohol  Screening Score (AUDIT): 3  Housing: Unknown (06/09/2024)   Epic    Unable to Pay for Housing in the Last Year: No    Number of Times Moved in the Last Year: Not on file    Homeless in the Last Year: No  Utilities: Not At Risk (06/09/2024)   Epic    Threatened with loss of utilities: No  Health Literacy: Not on file      PHYSICAL EXAM  Vitals:   07/20/24 0857  BP: 136/77  [name]: 93  Weight: 195 lb (88.5 kg)  Height: 6' (1.829 m)   Body mass index is 26.45 kg/m.     07/20/2024   11:15 AM  MMSE - Mini Mental State Exam  Orientation to time 3  Orientation to Place 5  Registration 3  Attention/ Calculation 4  Recall 1  Language- name 2 objects 2  Language- repeat 1  Language- follow 3 step command 3  Language- read & follow direction 1  Write a sentence 1  Copy design 1  Total score 25     Generalized: Well developed, in no acute distress   Neurological examination  Mentation: Alert oriented to time, place, history taking. Follows all commands speech and language fluent Cranial nerve II-XII: Pupils were equal round reactive to light. Extraocular movements were full, visual field were full on confrontational test. Facial sensation and strength were normal. Uvula tongue midline. Head turning and shoulder shrug  were normal  and symmetric. Motor: The motor testing reveals 5 over 5 strength of all 4 extremities. Good symmetric motor tone is noted throughout.  Sensory: Sensory testing is intact to soft touch on all 4 extremities. No evidence of extinction is noted.  Coordination: Cerebellar testing reveals good finger-nose-finger and heel-to-shin bilaterally.  Gait and station: Gait is normal.  Reflexes: Deep tendon reflexes are symmetric and normal bilaterally.   DIAGNOSTIC DATA (LABS, IMAGING, TESTING) - I reviewed patient records, labs, notes, testing and imaging myself where available.  Lab Results  Component Value Date   WBC 6.9 07/17/2024   HGB 15.7 07/17/2024    HCT 48.2 07/17/2024   MCV 86.4 07/17/2024   PLT 186 07/17/2024      Component Value Date/Time   NA 136 07/17/2024 2135   NA 140 04/08/2018 1102   K 4.3 07/17/2024 2135   CL 101 07/17/2024 2135   CO2 25 07/17/2024 2135   GLUCOSE 221 (H) 07/17/2024 2135   BUN 26 (H) 07/17/2024 2135   BUN 9 04/08/2018 1102   CREATININE 1.48 (H) 07/17/2024 2135   CREATININE 1.34 (H) 07/20/2020 1416   CALCIUM  9.7 07/17/2024 2135   PROT 7.3 06/24/2024 1441   PROT 7.1 04/08/2018 1102   ALBUMIN 4.0 06/24/2024 1441   ALBUMIN 3.9 04/08/2018 1102   AST 22 06/24/2024 1441   ALT 20 06/24/2024 1441   ALKPHOS 57 06/24/2024 1441   BILITOT 0.6 06/24/2024 1441   BILITOT 0.4 04/08/2018 1102   GFRNONAA 51 (L) 07/17/2024 2135   GFRNONAA 55 (L) 07/20/2020 1416   GFRAA 64 07/20/2020 1416   Lab Results  Component Value Date   CHOL 123 06/07/2024   HDL 40 (L) 06/07/2024   LDLCALC 65 06/07/2024   LDLDIRECT 111.0 04/22/2015   TRIG 88 06/07/2024   CHOLHDL 3.1 06/07/2024   Lab Results  Component Value Date   HGBA1C 8.4 (H) 06/04/2024   Lab Results  Component Value Date   VITAMINB12 575 06/06/2024   Lab Results  Component Value Date   TSH 0.934 06/06/2024      ASSESSMENT AND PLAN 68 y.o. year old male  has a past medical history of Anxiety, CAD (coronary artery disease), native coronary artery, Chronic CHF (HCC), COLONIC POLYPS, HX OF (12/30/2006), Gout, H/O atrial flutter (08/13/2005), HEPATITIS B, CHRONIC (12/30/2006), History of alcohol abuse, History of hiatal hernia, History of medication noncompliance, HYPERCHOLESTEROLEMIA (07/11/2010), Hypertension, Left sciatic nerve pain (since 04/2015), LIVER FUNCTION TESTS, ABNORMAL, HX OF (12/30/2006), MITRAL REGURGITATION (12/30/2006), Osteochondrosarcoma (HCC) (08/13/1970), PAF (paroxysmal atrial fibrillation) (HCC), Sleep apnea, and Type II diabetes mellitus (HCC). here with:  Multifocal bilateral small infarct embolic likely secondary to PAF not compliant  with Eliquis    Continue Eliquis  (apixaban ) daily   for secondary stroke prevention.  Currently managed by cardiology Discussed secondary stroke prevention measures and importance of close PCP follow up for aggressive stroke risk factor management. I have gone over the pathophysiology of stroke, warning signs and symptoms, risk factors and their management in some detail with instructions to go to the closest emergency room for symptoms of concern. HTN: BP goal <130/90.   HLD: LDL goal <70. Recent LDL 65.  DMII: A1c goal<7.0. Recent A1c 8.4.  Encouraged patient to monitor diet and encouraged exercise MMSE 25/30 Recommended that he refrain from driving alone d/t memory. Daughter agrees with this. We will re-evaluate in 3 months If patient continues to notice change in mood can discuss medication with PCP FU with our office 3-4 months  Duwaine Russell, MSN, NP-C 08/17/2024, 4:50 PM [facility] [address], [address] [place], [place] [address] [phone] "

## 2024-07-21 ENCOUNTER — Other Ambulatory Visit: Payer: Self-pay | Admitting: "Endocrinology

## 2024-07-21 ENCOUNTER — Ambulatory Visit (HOSPITAL_COMMUNITY)
Admission: RE | Admit: 2024-07-21 | Discharge: 2024-07-21 | Attending: Physician Assistant | Admitting: Physician Assistant

## 2024-07-21 VITALS — BP 118/70 | HR 96 | Ht 69.0 in | Wt 194.2 lb

## 2024-07-21 DIAGNOSIS — I483 Typical atrial flutter: Secondary | ICD-10-CM

## 2024-07-21 DIAGNOSIS — I4819 Other persistent atrial fibrillation: Secondary | ICD-10-CM

## 2024-07-21 DIAGNOSIS — I4891 Unspecified atrial fibrillation: Secondary | ICD-10-CM

## 2024-07-21 NOTE — Progress Notes (Signed)
 Primary Care Physician: [name] Clotilda SAUNDERS, [name] [name] Primary Cardiologist: [name] Shuck, [name] [name] Electrophysiologist: [name] Birmingham, [name] [name]  Referring Physician: Dr [name] [name] is a 68 y.o. male with a history of HTN, DM, CAD, TIA, CHF, HCM, OSA, hepatitis B, atrial flutter, atrial fibrillation who presents for follow up in the [facility].  The patient was initially diagnosed with atrial flutter remotely and had two flutter ablations in 2007 and 2008. He was later diagnosed with afib and atypical atrial flutter. He has had multiple DCCVs. He was on sotalol  but this was discontinued due to noncompliance. He was also on amiodarone  but he was lost to follow up and this was also stopped. Patient is on Eliquis  for stroke prevention.    Patient presents today for follow up for atrial fibrillation and atrial flutter. He remains in atrial flutter today. He does report some fatigue but otherwise is unaware of his arrhythmia. He was seen at the ED 07/17/24 for epistaxis and had Rhino placed. Eliquis  was held for two days. No further bleeding issues.   Today, he denies symptoms of palpitations, chest pain, shortness of breath, orthopnea, PND, lower extremity edema, dizziness, presyncope, syncope, bleeding, or neurologic sequela. The patient is tolerating medications without difficulties and is otherwise without complaint today.    Atrial Fibrillation Risk Factors:  he does have symptoms or diagnosis of sleep apnea. he does not have a history of rheumatic fever. he does have a history of alcohol use.   Atrial Fibrillation Management history:  Previous antiarrhythmic drugs: sotalol , amiodarone   Previous cardioversions: 2017, 12/14/22, 10/02/23 Previous ablations: 2007 flutter, 2008 flutter Anticoagulation history: Eliquis   ROS- All systems are reviewed and negative except as per the HPI above.  Past Medical History:  Diagnosis Date   Anxiety    CAD (coronary  artery disease), native coronary artery    a. Nonobstructive CAD by cath 2013 - diffuse distal and branch vessel CAD, no severe disease in the major coronaries, LV mild global hypokinesis, EF 45%. b. ETT-Sestamibi 5/14: EF 31%, small fixed inferior defect with no ischemia.   Chronic CHF (HCC)    a. Mixed ICM/NICM (?EtOH). EF 35% in 2008. Echo 5/13: EF 60-65%, mod LVH, EF 45% on V gram in 12/2011. EF 12/2012: EF 50-55%, mild LVH, inferobasal HK, mild MR. ETT-Ses 5/14 EF 41%. Cardiac MRI 5/14: EF 44%, mild global HK, subepicardial delayed enhancement in nonspecific RV insertion pattern.   COLONIC POLYPS, HX OF 12/30/2006   Gout    H/O atrial flutter 08/13/2005   a. Ablations in 2007, 2008.   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 (Patient taking differently: Take 1,000 mg by mouth as needed (pain).)     amLODipine  (  NORVASC ) 10 MG tablet Take 1 tablet (10 mg total) by mouth daily. 90 tablet 0   apixaban  (ELIQUIS ) 5 MG TABS tablet Take 1 tablet (5 mg total) by mouth 2 (two) times daily. 180 tablet 1   atorvastatin  (LIPITOR ) 80 MG tablet Take 1 tablet (80 mg total) by mouth daily. 90 tablet 1   Blood Glucose Monitoring Suppl DEVI 1 each by Does not apply route in the morning, at noon, and at bedtime. May substitute to any  manufacturer covered by patient's insurance. 1 each 0   Continuous Glucose Receiver (FREESTYLE LIBRE 3 READER) DEVI 1 Device by Does not apply route continuous. 2 each 11   Continuous Glucose Sensor (FREESTYLE LIBRE 3 SENSOR) MISC 1 Device by Does not apply route every 14 (fourteen) days. 6 each 1   eplerenone  (INSPRA ) 25 MG tablet Take 1 tablet (25 mg total) by mouth daily. 90 tablet 1   ezetimibe  (ZETIA ) 10 MG tablet Take 1 tablet (10 mg total) by mouth daily. 30 tablet 10   folic acid  (FOLVITE ) 1 MG tablet Take 1 tablet (1 mg total) by mouth daily. 90 tablet 0   Glucose Blood (BLOOD GLUCOSE TEST STRIPS) STRP 1 each by In Vitro route in the morning, at noon, and at bedtime. May substitute to any manufacturer covered by patient's insurance. 100 strip 11   insulin  glargine (LANTUS  SOLOSTAR) 100 UNIT/ML Solostar Pen Inject 20 Units into the skin at bedtime. 6 mL 0   insulin  lispro (HUMALOG ) 100 UNIT/ML injection Inject 0.01 mLs (1 Units total) into the skin 3 (three) times daily with meals. Sliding scale 70-140=0 Units 141-170=1 Units 171-200=2 Units 201-230=3 Units 231-260=4 Units 261-290=5 Units 291-320=6 Units 231-350=7 Units 351-380=8 Units Over 380 10 units 2.7 mL 0   Insulin  Pen Needle 32G X 4 MM MISC Use as directed with Lantus . 100 each 0   isosorbide  mononitrate (IMDUR ) 120 MG 24 hr tablet Take 1 tablet (120 mg total) by mouth daily. 90 tablet 1   JARDIANCE  25 MG TABS tablet TAKE 1 TABLET BY MOUTH ONCE DAILY CONTACT OFFICE FOR APPOINTMENT 30 tablet 10   Lancets Misc. MISC 1 each by Does not apply route in the morning, at noon, and at bedtime. May substitute to any manufacturer covered by patient's insurance. 100 each 11   losartan  (COZAAR ) 50 MG tablet Take 1 tablet (50 mg total) by mouth daily. 90 tablet 3   metoprolol  succinate (TOPROL -XL) 25 MG 24 hr tablet Take 0.5 tablets (12.5 mg total) by mouth daily. 45 tablet 3   Multiple Vitamins-Minerals (MULTIVITAMIN WITH MINERALS) tablet Take 1  tablet by mouth daily. 90 tablet 0   nitroGLYCERIN  (NITROSTAT ) 0.4 MG SL tablet DISSOLVE 1 TABLET UNDER THE TONGUE AS NEEDED FOR CHEST PAIN EVERY 5 MINUTES UP TO 3 TIMES. IF NO RELIEF CALL 911. 25 tablet 11   thiamine  (VITAMIN B1) 100 MG tablet Take 1 tablet (100 mg total) by mouth daily. 90 tablet 0   torsemide  (DEMADEX ) 20 MG tablet Take 2 tablets (40 mg total) by mouth daily as needed (as needed for weight gain >2 lbs in 24 hrs or > 5 lbs in a week , increased leg swelling, increased shortness of breath. May take another dose if symptoms persist and contact provider). (Patient taking differently: Take 40 mg by mouth as needed (as needed for weight gain >2 lbs in 24 hrs or > 5 lbs in a week , increased leg swelling, increased shortness of breath. May take another dose  if symptoms persist and contact provider).) 180 tablet 0   No current facility-administered medications for this encounter.    Physical Exam: BP 118/70   Pulse 96   Ht 5' 9 (1.753 m)   Wt 88.1 kg   BMI 28.68 kg/m   GEN: Well nourished, well developed in no acute distress CARDIAC: Regular rate and rhythm, no murmurs, rubs, gallops RESPIRATORY:  Clear to auscultation without rales, wheezing or rhonchi  ABDOMEN: Soft, non-tender, non-distended EXTREMITIES:  No edema; No deformity   Wt Readings from Last 3 Encounters:  07/21/24 88.1 kg  07/20/24 88.9 kg  07/20/24 88.5 kg     EKG Interpretation Date/Time:  Tuesday July 21 2024 10:31:45 EST Ventricular Rate:  96 PR Interval:    QRS Duration:  84 QT Interval:  350 QTC Calculation: 442 R Axis:   23  Text Interpretation: Atrial flutter with 4:1 A-V conduction ST & T wave abnormality, consider lateral ischemia Abnormal ECG When compared with ECG of 13-Jul-2024 10:36, No significant change was found Confirmed by Laurance Heide ([id number]) on 07/21/2024 10:38:41 AM    Echo 06/08/24 demonstrated   1. Left ventricular ejection fraction, by estimation, is 55 to 60%. The  left  ventricle has normal function. The left ventricle has no regional  wall motion abnormalities. There is mild left ventricular hypertrophy.  Left ventricular diastolic parameters are indeterminate.   2. Right ventricular systolic function is normal. The right ventricular  size is normal. There is moderately elevated pulmonary artery systolic  pressure.   3. Left atrial size was moderately dilated.   4. Mild mitral valve regurgitation.   5. The aortic valve is tricuspid. Aortic valve regurgitation is mild.  Aortic valve sclerosis/calcification is present, without any evidence of  aortic stenosis.   6. The inferior vena cava is normal in size with <50% respiratory  variability, suggesting right atrial pressure of 8 mmHg.    CHA2DS2-VASc Score = 7  The patient's score is based upon: CHF History: 1 HTN History: 1 Diabetes History: 1 Stroke History: 2 Vascular Disease History: 1 Age Score: 1 Gender Score: 0       ASSESSMENT AND PLAN: Persistent Atrial Fibrillation/atrial flutter (ICD10:  I48.19) The patient's CHA2DS2-VASc score is 7, indicating a 11.2% annual risk of stroke.   S/p flutter ablations in 2007 and 2008. Previously on sotalol  and amiodarone .  Patient currently in atrial flutter.  We discussed rhythm control options today. Would avoid class IC with h/o CAD. Sotalol  discontinued due to noncompliance, would not favor loading dofetilide. We discussed reloading amiodarone  vs repeat ablation vs rate control. He would like to discuss repeat ablation with EP before making a decision, will refer.  Continue Eliquis  5 mg BID Continue Toprol  12.5 mg daily  Secondary Hypercoagulable State (ICD10:  D68.69) The patient is at significant risk for stroke/thromboembolism based upon his CHA2DS2-VASc Score of 7.  Continue Apixaban  (Eliquis ). No bleeding issues.   CAD No anginal symptoms Followed by Dr Rolan  HTN Stable on current regimen  HFrecEF EF 55-60% GDMT per [facility] team Fluid  status appears stable today  OSA  Seen by pulmonology yesterday to resume CPAP.   Follow up with EP to establish care and discuss afib/flutter ablation.     [facility] [address] [place], [place] [address] [phone]

## 2024-07-22 NOTE — Progress Notes (Signed)
 I agree with the above plan

## 2024-07-27 ENCOUNTER — Other Ambulatory Visit (INDEPENDENT_AMBULATORY_CARE_PROVIDER_SITE_OTHER)

## 2024-07-27 DIAGNOSIS — E1165 Type 2 diabetes mellitus with hyperglycemia: Secondary | ICD-10-CM

## 2024-07-27 DIAGNOSIS — I10 Essential (primary) hypertension: Secondary | ICD-10-CM

## 2024-07-27 DIAGNOSIS — Z794 Long term (current) use of insulin: Secondary | ICD-10-CM

## 2024-07-27 NOTE — Progress Notes (Signed)
 07/27/2024 Name: [name] [name] MRN: [id number] DOB: [birth year]  Chief Complaint  Patient presents with   Medication Management   Hypertension   Diabetes    [name] [name] is a 68 y.o. year old male who presented for a telephone visit.   They were referred to the pharmacist by their PCP for assistance in managing diabetes and complex medication management.    Subjective:  Care Team: Primary Care Provider: Mercer Clotilda SAUNDERS, MD   Medication Access/Adherence  Current Pharmacy:  [facility] [address] ([address]), [place] - [address] [place] ([place]) [place] [address] Phone: [phone] Fax: [phone]  [facility] [place]  - [place] - [address] [place] [address] Phone: [phone] Fax: [phone]   Patient reports affordability concerns with their medications: No  Patient reports access/transportation concerns to their pharmacy: No  Patient reports adherence concerns with their medications:  No     Diabetes:  Current medications: Jardiance  25mg , Metformin  (self-stopped), Lantus  20 units into skin daily, Humalog  TID ss  Inject 1 Units into the skin 3 (three) times daily with meals. Sliding scale 70-140=0 Units 141-170=1 Units 171-200=2 Units 201-230=3 Units 231-260=4 Units 261-290=5 Units 291-320=6 Units 231-350=7 Units 351-380=8 Units Over 380 10 units   Medications tried in the past: Tresiba   Current glucose readings:    Observed patterns:  Patient denies hypoglycemic s/sx including dizziness, shakiness, sweating. Patient denies hyperglycemic symptoms including polyuria, polydipsia, polyphagia, nocturia, neuropathy, blurred vision.    Macrovascular and Microvascular Risk Reduction:  Statin? yes (Atorvastatin  80mg ); ACEi/ARB? yes (Losartan  50mg ) Last urinary albumin/creatinine ratio:  Lab Results  Component Value Date   MICRALBCREAT 636.3 (H) 06/24/2024    MICRALBCREAT 7 01/26/2016   Last eye exam:  Lab Results  Component Value Date   HMDIABEYEEXA No Retinopathy 10/21/2020   Last foot exam: 07/01/2024 Tobacco Use:  Tobacco Use: Medium Risk (07/20/2024)   Patient History    Smoking Tobacco Use: Former    Smokeless Tobacco Use: Never    Passive Exposure: Not on file   Hypertension:  Current medications: Amlodipine  10mg  daily, Losartan  50mg  daily, Metoprolol  25mg  1/2 tab daily  Patient has a validated, automated, upper arm home BP cuff Current blood pressure readings readings: 112/65 12/11  Patient denies hypotensive s/sx including dizziness, lightheadedness.  Patient denies hypertensive symptoms including headache, chest pain, shortness of breath    Objective:  Lab Results  Component Value Date   HGBA1C 8.4 (H) 06/04/2024    Lab Results  Component Value Date   CREATININE 1.48 (H) 07/17/2024   BUN 26 (H) 07/17/2024   NA 136 07/17/2024   K 4.3 07/17/2024   CL 101 07/17/2024   CO2 25 07/17/2024    Lab Results  Component Value Date   CHOL 123 06/07/2024   HDL 40 (L) 06/07/2024   LDLCALC 65 06/07/2024   LDLDIRECT 111.0 04/22/2015   TRIG 88 06/07/2024   CHOLHDL 3.1 06/07/2024    Medications Reviewed Today     Reviewed by Lionell Jon DEL, RPH (Pharmacist) on 07/27/24 at 1518  Med List Status: <None>   Medication Order Taking? Sig Documenting Provider Last Dose Status Informant  acetaminophen  (TYLENOL ) 500 MG tablet [id number] Yes Take 1,000 mg by mouth every 6 (six) hours as needed (pain). [provider]  Active Self  amLODipine  (NORVASC ) 10 MG tablet [id number] Yes Take 1 tablet (10 mg total) by mouth daily. Mercer Clotilda SAUNDERS, MD  Active  apixaban  (ELIQUIS ) 5 MG TABS tablet [id number] Yes Take 1 tablet (5 mg total) by mouth 2 (two) times daily. Rolan Ezra RAMAN, MD  Active   atorvastatin  (LIPITOR ) 80 MG tablet [id number] Yes Take 1 tablet (80 mg total) by mouth daily. Rolan Ezra RAMAN, MD  Active   Blood  Glucose Monitoring Suppl DEVI [id number]  1 each by Does not apply route in the morning, at noon, and at bedtime. May substitute to any manufacturer covered by patient's insurance. Mercer Clotilda SAUNDERS, MD  Active Self  Continuous Glucose Receiver (FREESTYLE LIBRE 3 READER) DEVI [id number]  1 Device by Does not apply route continuous. Mercer Clotilda SAUNDERS, MD  Active Self  Continuous Glucose Sensor (FREESTYLE LIBRE 3 SENSOR) [place] [id number] Yes 1 Device by Does not apply route every 14 (fourteen) days. Komal, Motwani, MD  Active Self  eplerenone  (INSPRA ) 25 MG tablet [id number] Yes Take 1 tablet (25 mg total) by mouth daily. Rolan Ezra RAMAN, MD  Active   ezetimibe  (ZETIA ) 10 MG tablet [id number] Yes Take 1 tablet (10 mg total) by mouth daily. Rolan Ezra RAMAN, MD  Active   folic acid  (FOLVITE ) 1 MG tablet [id number] Yes Take 1 tablet (1 mg total) by mouth daily. Mercer Clotilda SAUNDERS, MD  Active   Glucose Blood (BLOOD GLUCOSE TEST STRIPS) STRP [id number]  1 each by In Vitro route in the morning, at noon, and at bedtime. May substitute to any manufacturer covered by patient's insurance. Mercer Clotilda SAUNDERS, MD  Active Self  insulin  glargine (LANTUS  SOLOSTAR) 100 UNIT/ML Solostar Pen [id number] Yes Inject 20 Units into the skin at bedtime. Komal, Motwani, MD  Active   insulin  lispro (HUMALOG ) 100 UNIT/ML injection [id number] Yes Inject 0.01 mLs (1 Units total) into the skin 3 (three) times daily with meals. Sliding scale 70-140=0 Units 141-170=1 Units 171-200=2 Units 201-230=3 Units 231-260=4 Units 261-290=5 Units 291-320=6 Units 231-350=7 Units 351-380=8 Units Over 380 10 units Komal, Motwani, MD  Active   Insulin  Pen Needle 32G X 4 MM MISC [id number]  Use as directed with Lantus . Al-Sultani, Anmar, MD  Active   isosorbide  mononitrate (IMDUR ) 120 MG 24 hr tablet [id number] Yes Take 1 tablet (120 mg total) by mouth daily. Rolan Ezra RAMAN, MD  Active   JARDIANCE  25 MG TABS tablet [id number] Yes TAKE 1 TABLET BY MOUTH ONCE DAILY  CONTACT OFFICE FOR APPOINTMENT Obadiah Birmingham, MD  Active Self  Lancets Misc. MISC [id number]  1 each by Does not apply route in the morning, at noon, and at bedtime. May substitute to any manufacturer covered by patient's insurance. Mercer Clotilda SAUNDERS, MD  Active Self  losartan  (COZAAR ) 50 MG tablet [id number] Yes Take 1 tablet (50 mg total) by mouth daily. Mercer Clotilda SAUNDERS, MD  Active   metoprolol  succinate (TOPROL -XL) 25 MG 24 hr tablet [id number] Yes Take 0.5 tablets (12.5 mg total) by mouth daily. Mercer Clotilda SAUNDERS, MD  Active   Multiple Vitamins-Minerals (MULTIVITAMIN WITH MINERALS) tablet [id number] Yes Take 1 tablet by mouth daily. Mercer Clotilda SAUNDERS, MD  Active   nitroGLYCERIN  (NITROSTAT ) 0.4 MG SL tablet [id number]  DISSOLVE 1 TABLET UNDER THE TONGUE AS NEEDED FOR CHEST PAIN EVERY 5 MINUTES UP TO 3 TIMES. IF NO RELIEF CALL 911. Bensimhon, Toribio SAUNDERS, MD  Active   thiamine  (VITAMIN B1) 100 MG tablet [id number] Yes Take 1 tablet (100 mg total) by mouth daily. Mercer Clotilda SAUNDERS, MD  Active   torsemide  (DEMADEX ) 20 MG tablet [id number] Yes Take 2 tablets (40 mg total)  by mouth daily as needed (as needed for weight gain >2 lbs in 24 hrs or > 5 lbs in a week , increased leg swelling, increased shortness of breath. May take another dose if symptoms persist and contact provider). Mercer Clotilda SAUNDERS, MD  Active               Assessment/Plan:   Diabetes: - Currently uncontrolled; goal A1c <7%. Cardiorenal risk reduction is opportunities for improvement.. Blood pressure is not at goal <130/80. LDL is at goal.  - Reviewed goal A1c, goal fasting, and goal 2 hour post prandial glucose. Recommended to check glucose continuously with sensor, connected to office portal. - Recommend to continue current medication therapy for now. Removed metformin  from list as patient reports he has not taken in some time due to causing upset stomach. -If BG remains elevated at next f/u, consider insulin  increase or  GLP-1  Hypertension: - Currently controlled - Reviewed long term cardiovascular and renal outcomes of uncontrolled blood pressure - Reviewed appropriate blood pressure monitoring technique and reviewed goal blood pressure. Recommended to check home blood pressure and heart rate daily - Recommend to continue current med therapy. Consider dose increase on losartan  if BP elevates   Follow Up Plan: 4 weeks  Jon VEAR Lindau, PharmD Clinical Pharmacist [phone]

## 2024-08-10 ENCOUNTER — Telehealth (HOSPITAL_COMMUNITY): Payer: Self-pay

## 2024-08-10 ENCOUNTER — Other Ambulatory Visit: Payer: Self-pay | Admitting: "Endocrinology

## 2024-08-10 DIAGNOSIS — E1165 Type 2 diabetes mellitus with hyperglycemia: Secondary | ICD-10-CM

## 2024-08-10 NOTE — Telephone Encounter (Signed)
 Called to confirm/remind patient of their appointment at the [facility] on 08/11/24.   Appointment:   [x] Confirmed  [] Left mess   [] No answer/No voice mail  [] VM Full/unable to leave message  [] Phone not in service  Patient reminded to bring all medications and/or complete list.  Confirmed patient has transportation. Gave directions, instructed to utilize valet parking.

## 2024-08-11 ENCOUNTER — Ambulatory Visit (HOSPITAL_COMMUNITY)
Admission: RE | Admit: 2024-08-11 | Discharge: 2024-08-11 | Disposition: A | Discharge status: Home / Self Care | Source: Ambulatory Visit | Attending: Family Medicine | Admitting: Family Medicine

## 2024-08-11 ENCOUNTER — Encounter (HOSPITAL_COMMUNITY): Payer: Self-pay

## 2024-08-11 VITALS — BP 134/80 | HR 83 | Ht 69.0 in | Wt 195.0 lb

## 2024-08-11 DIAGNOSIS — Z8673 Personal history of transient ischemic attack (TIA), and cerebral infarction without residual deficits: Secondary | ICD-10-CM | POA: Diagnosis not present

## 2024-08-11 DIAGNOSIS — Z79899 Other long term (current) drug therapy: Secondary | ICD-10-CM | POA: Insufficient documentation

## 2024-08-11 DIAGNOSIS — G459 Transient cerebral ischemic attack, unspecified: Secondary | ICD-10-CM | POA: Diagnosis not present

## 2024-08-11 DIAGNOSIS — G4733 Obstructive sleep apnea (adult) (pediatric): Secondary | ICD-10-CM | POA: Insufficient documentation

## 2024-08-11 DIAGNOSIS — I130 Hypertensive heart and chronic kidney disease with heart failure and stage 1 through stage 4 chronic kidney disease, or unspecified chronic kidney disease: Secondary | ICD-10-CM | POA: Diagnosis present

## 2024-08-11 DIAGNOSIS — I480 Paroxysmal atrial fibrillation: Secondary | ICD-10-CM | POA: Insufficient documentation

## 2024-08-11 DIAGNOSIS — Z87891 Personal history of nicotine dependence: Secondary | ICD-10-CM | POA: Insufficient documentation

## 2024-08-11 DIAGNOSIS — E1122 Type 2 diabetes mellitus with diabetic chronic kidney disease: Secondary | ICD-10-CM | POA: Diagnosis not present

## 2024-08-11 DIAGNOSIS — Z91199 Patient's noncompliance with other medical treatment and regimen due to unspecified reason: Secondary | ICD-10-CM | POA: Diagnosis not present

## 2024-08-11 DIAGNOSIS — I5032 Chronic diastolic (congestive) heart failure: Secondary | ICD-10-CM | POA: Insufficient documentation

## 2024-08-11 DIAGNOSIS — I4892 Unspecified atrial flutter: Secondary | ICD-10-CM | POA: Insufficient documentation

## 2024-08-11 DIAGNOSIS — N183 Chronic kidney disease, stage 3 unspecified: Secondary | ICD-10-CM | POA: Diagnosis not present

## 2024-08-11 DIAGNOSIS — I2510 Atherosclerotic heart disease of native coronary artery without angina pectoris: Secondary | ICD-10-CM | POA: Diagnosis not present

## 2024-08-11 DIAGNOSIS — I483 Typical atrial flutter: Secondary | ICD-10-CM

## 2024-08-11 DIAGNOSIS — Z7901 Long term (current) use of anticoagulants: Secondary | ICD-10-CM | POA: Diagnosis not present

## 2024-08-11 DIAGNOSIS — I25119 Atherosclerotic heart disease of native coronary artery with unspecified angina pectoris: Secondary | ICD-10-CM

## 2024-08-11 DIAGNOSIS — E785 Hyperlipidemia, unspecified: Secondary | ICD-10-CM | POA: Insufficient documentation

## 2024-08-11 DIAGNOSIS — F1010 Alcohol abuse, uncomplicated: Secondary | ICD-10-CM | POA: Diagnosis not present

## 2024-08-11 DIAGNOSIS — Z955 Presence of coronary angioplasty implant and graft: Secondary | ICD-10-CM | POA: Diagnosis not present

## 2024-08-11 DIAGNOSIS — I4891 Unspecified atrial fibrillation: Secondary | ICD-10-CM | POA: Diagnosis not present

## 2024-08-11 DIAGNOSIS — Z794 Long term (current) use of insulin: Secondary | ICD-10-CM | POA: Insufficient documentation

## 2024-08-11 DIAGNOSIS — Z9049 Acquired absence of other specified parts of digestive tract: Secondary | ICD-10-CM | POA: Diagnosis not present

## 2024-08-11 DIAGNOSIS — Z7984 Long term (current) use of oral hypoglycemic drugs: Secondary | ICD-10-CM | POA: Insufficient documentation

## 2024-08-11 DIAGNOSIS — Z8639 Personal history of other endocrine, nutritional and metabolic disease: Secondary | ICD-10-CM | POA: Diagnosis not present

## 2024-08-11 DIAGNOSIS — I10 Essential (primary) hypertension: Secondary | ICD-10-CM | POA: Diagnosis not present

## 2024-08-11 MED ORDER — METOPROLOL SUCCINATE ER 25 MG PO TB24: 25.0000 mg | ORAL_TABLET | Freq: Every day | ORAL | 3 refills | Status: AC

## 2024-08-11 MED ORDER — EMPAGLIFLOZIN 25 MG PO TABS: 25.0000 mg | ORAL_TABLET | Freq: Every day | ORAL | 10 refills | Status: AC

## 2024-08-11 NOTE — Patient Instructions (Addendum)
 CHANGE Toprol  XL to 25 mg daily.  Your physician recommends that you schedule a follow-up appointment in: 4 months ( April 2026) ** PLEASE CALL THE OFFICE IN FEBRUARY TO ARRANGE YOUR FOLLOW UP APPOINTMENT.**  If you have any questions or concerns before your next appointment please send us  a message through [facility] or call our office at [phone].    TO LEAVE A MESSAGE FOR THE NURSE SELECT OPTION 2, PLEASE LEAVE A MESSAGE INCLUDING: YOUR NAME DATE OF BIRTH CALL BACK NUMBER REASON FOR CALL**this is important as we prioritize the call backs  YOU WILL RECEIVE A CALL BACK THE SAME DAY AS LONG AS YOU CALL BEFORE 4:00 PM  At the [facility], you and your health needs are our priority. As part of our continuing mission to provide you with exceptional heart care, we have created designated Provider Care Teams. These Care Teams include your primary Cardiologist (physician) and Advanced Practice Providers (APPs- Physician Assistants and Nurse Practitioners) who all work together to provide you with the care you need, when you need it.   You may see any of the following providers on your designated Care Team at your next follow up: Dr Toribio Fuel Dr Ezra Shuck Dr. Morene Brownie Greig Mosses, NP Caffie Shed, [place] [facility] [place], [place] Beckey Coe, NP Jordan Lee, NP Ellouise Class, NP Tinnie Redman, PharmD Jaun Bash, PharmD   Please be sure to bring in all your medications bottles to every appointment.    Thank you for choosing [facility]

## 2024-08-11 NOTE — Progress Notes (Signed)
 "  Patient ID: [name] [name], male   DOB: 1955/08/14, 68 y.o.   MRN: [id number] PCP: Mercer Clotilda SAUNDERS, MD HF Cardiology: Dr. Rolan  68 y.o. with history of HTN, diabetes, paroxysmal atrial fibrillation, and CAD presents for cardiology followup.  He was admitted in 5/13 with chest pain with exertion.  LHC was done showing global HK with EF 45% and diffuse, severe distal and branch vessel disease.  There was no interventional option, but it was suspect that this disease could be causing his anginal-type pain.  Echo at that time was read as showing EF 60-65%.  He was admitted in 5/14 with hypertensive urgency and chest pain.  He ruled out for MI and BP was controlled.  His EF was 50-55% by echo.  ETT-Sestamibi done as outpatient showed small fixed inferior defect with no ischemia but EF was 31%.  He was quite hypertensive during the study.  Cardiac MRI done to confirm EF in 5/14 showed EF 44%.  He had an episode of atrial fibrillation/RVR in 3/15 but went back into NSR.  Echo in 2/16 showed EF 55-60% with moderate diastolic dysfunction.   He had a Cardiolite  in 9/16 showing inferior ischemia.  He was having exertional dyspnea with less activity than normal.  There was concern for worsened CAD and he was sent for [facility] in 9/16. By RHC, filling pressures were not significantly elevated. He had a severe distal RCA stenosis that was treated with DES x 2.  Echo showed EF 55-60%.    In 10/16, he was admitted with acute cholecystitis.  He received a cholecystostomy tube.  Cholecystectomy done in 6/17.   Atrial fibrillation in 1/17, cardioverted to NSR.   Recurrent concerning chest pain in 3/17.  Repeat LHC showed diffuse distal and branch vessel disease consistent with poorly-controlled DM, but patent RCA stents.    He was admitted with atrial fibrillation in 12/17. Started on sotalol  and converted to NSR.    He had recurrent chest pain in 5/18 with abnormal Cardiolite .  Cath was done again in 5/18,  showing diffuse CAD with no good interventional options.   Admitted 11/20 with increased dyspnea and exertional chest pain. Hgb went down to 8.1. GI consulted.  Had LHC that showed severe distal and branch vessel disease but no interventional targets and not good anatomy for CABG. Continued on medical management. Capsule endoscopy without recent or active bleeding, no AVMs. He went into atrial flutter during his hospitalization. He went back into NSR, however, after discharge.  +Snoring and daytime sleepiness.   He has had episodes of syncope associated with vigorous coughing.   He was in the ER with atrial flutter and dyspnea in 4/21. Zio monitor in 9/22 showed NSR with PACs.   Evaluated in ED 01/14/22 for cough, found to be COVID +. Received molnupiravir .  Admitted 01/22/22 with CP. Cards consulted, troponin negative x 2, Echo showed EF 55-60%, severe asymmetric LV hypertrophy, normal RV. No new WMA and CP resolved, so LHC deferred.  Coreg  decreased to 12.5 bid due to low BP and Mobitz Type 1. Discharged home, weight 200 lbs.   cMRI 10/23 concerning for hypertrophic cardiomyopathy.  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  LMNA mutations can cause a HCM phenotype.   Scheduled for TEE/DCCV for recurrent atrial flutter in 5/24. LA appendage thrombus seen so DCCV aborted (noncompliant with Eliquis ). He was brought back for repeat TEE/DCCV in 7/24 after being  compliant with Eliquis . He was in NSR when he came in for the procedure.   He was admitted in 1/25 at [facility] with expressive aphasia that resolved.  He had not been taking any medications except for occasionally Eliquis .  Imaging did not show CVA, TIA suspected. Echo showed EF 60-65% with severe LVH and normal RV. Carotid dopplers showed 40-59% LICA stenosis. LDL was 159 (not taking statin) and HgbA1c was 13.2 (not compliant with diabetes regimen).   Post hospital follow up 1/25,  he was in atrial flutter. Not on sotalol  => was not on it when he went to the hospital in [facility] and they did not restart it. NYHA II and volume stable. He was loaded with amiodarone  with plan to follow up in 2 weeks. If remained in AFL, arrange TEE-guided DCCV (given poor compliance and history of LA appendage thrombus).  S/p TEE/DCCV 2/25. LVEF 50-55%, severe asymmetric LVH of septal segment, RV normal, no  thrombus. Successful DCCV to NSR.  Admitted 10/25 with hypertensive emergency, AKI and encephalopathy. He had been non complaint with home medications. Required nitroglycerin  gtt for BP control. Neurology consulted due to confusion and felt it was multifactorial . MRA showed occlusion of nondominant left vertebral artery at the dural margin, US  carotid and EEG were unremarkable. Echo showed EF 55-60%, mild LVH. Drips weaned off and GDMT titrated, but limited by AKI. He was discharged home, weight 191 lbs.  Follow up 07/13/24, found to be in rate controlled AFL. Referred to AF clinic to discuss options. Seen in ED 07/17/24 with nosebleed, RhinoRocket placed and AC held for 2 days.  Follow up 12/25 with AF clinic, referred to EP to discuss re-do AFL ablation.  Today he returns for HF follow up with his daughter. Has URI, but breathing OK. He is not SOB walking on flat ground ADLs. Ankles swell occasionally. He had 1 episode of non exertional CP recently, resolved spontaneously. Occasional positional dizziness. Denies palpitations, abnormal bleeding, or PND/Orthopnea. Appetite ok. Weight at home 190 pounds. Taking all medications. Staying with daughter who greatly helps with medication management. No further ETOH use in 2 months.  ECG (personally reviewed): none ordered today.  Cardiomems: not using  Labs (1/24): K 3.7, creatinine 1.64 Labs (5/24): K 2.8, creatinine 2.01 Labs (7/24): LDL 48 Labs (1/25): K 3.6, creatinine 1.45 Labs (5/25): K 3.7, creatinine 1.46 Labs (10/25): K 4.1,  creatinine 1.66, LDL 65 Labs (12/25): K 4.3, creatinine 1.48  PMH: 1. DM2 2. Gout 3. HTN: Cough with ACEI.  4. Cardiomyopathy: Suspect mixed ischemic/nonischemic (?ETOH-related).  EF 35% in 2008.  Echo in 5/13 showed EF 60-65% with moderate LVH but EF was 45% on LV-gram in 5/13.  Echo (5/14) with EF 50-55%, mild LVH, inferobasal HK, mild MR.  ETT-Sestamibi in 5/14, however, showed EF 31%.  Cardiac MRI (6/14) with EF 44%, mild global hypokinesis, subepicardial delayed enhancement in a nonspecific RV insertion site pattern.  Echo (2/16) with EF 55-60%, moderate diastolic dysfunction. RHC (9/16) with mean RA 8, PA 39/20 mean 28, mean PCWP 14, CI 2.2. Echo (9/16) with EF 55-60%, grade II diastolic dysfunction.  - Echo (6/17) with EF 55-60%, moderate LVH, grade II diastolic dysfunction, mild AI, mild MR.  - RHC (11/20): mean RA 4, PA 46/14 mean 26, mean PCWP 13, CI 3.23 - Echo (9/20): EF 60-65%, moderate LVH, normal RV size and systolic function, mild-moderate MR, mild AI.  - Echo (5/21): EF 55%, RV normal - Has Cardiomems.  - Echo (  6/23): EF 55-60%, severe asymmetric LV hypertrophy, normal RV. - cMRI (11/23): LVEF 47%, no LVOT, moderate to severe asymmetric hypertrophy of the basal to mid inferoseptal and anteroseptum, RVEF 46%, ECV 34%, non-coronary LGE pattern most consistent with hypertrophic CM - Echo (1/25): EF 60-65%, severe LVH, normal RV - Echo (10/25): EF 55-60%, mild LVH, normal RV 5. CAD: LHC in 2010 with mild nonobstructive disease.  LHC (5/13) with diffuse distal and branch vessel disease, mild global hypokinesis and EF 45%.  ETT-Sestamibi (5/14): EF 31%, small fixed inferior defect with no ischemia.  Lexiscan  Cardiolite  (9/16) with EF 26%, moderate inferior defect that was partially reversible, suggesting ischemia => High risk study. LHC (9/16) with 40-50% mLAD, diffuse up to 50% distal LAD, 90% ostium of branch off ramus, 95% dRCA => DES to RCA x 2, EF 55-60%.   - LHC (3/17) with diffuse  branch and distal vessel disease c/w poorly controlled DM, RCA stents patent.  - LHC (5/18): Diffuse CAD with no good interventional option. 60% mRCA, 90% dLAD, 50% serial mid ramus stenoses, ramus branches with ostial 80-90% stenoses. - LHC (11/20): Severe distal and branch vessel disease but no interventional targets and not good anatomy for CABG  6. H/o chronic HBV 7. Osteochondroma left shoulder.  8. Hyperlipidemia 9. Paroxysmal atrial fibrillation: Coumadin .  Developed cough and increased ESR with amiodarone .  DCCV in 1/17.  Recurrent atrial fibrillation in 12/17, sotalol  started.  10. Atrial flutter: had ablations in 2007 and 2008.  - Zio patch in 4/21 with 20% atrial flutter.  - DCCV 2/25 to NSR 11. OSA: Does not use CPAP.  12. Acute cholecystitis (10/16): Cholecystostomy tube placed.  Cholecystectomy 6/17.  13. CKD stage 3 14. Anemia - Suspected GI bleed but EGD/c-scope 9/20 without significant findings.  - Capsule Endoscopy (11/20) without recent or active bleeding, no AVMs.  15. Syncope: Suspected cough syncope.  16. OSA 17. TIA (1/25): Carotid dopplers (1/25) with 40-59% LICA stenosis.  - carotid doppler (10/25): 1-39% RICA stenosis, 1-39% LICA stenosis  SH: Married, prior smoker.  Some ETOH, occasionally heavy in past.  Does use occasional marijuana. Out of work chiropractor. 2 daughters in grad school.   FH: CAD  ROS: All systems reviewed and negative except as per HPI.   Current Outpatient Medications  Medication Sig Dispense Refill   acetaminophen  (TYLENOL ) 500 MG tablet Take 1,000 mg by mouth every 6 (six) hours as needed (pain).     amLODipine  (NORVASC ) 10 MG tablet Take 1 tablet (10 mg total) by mouth daily. 90 tablet 0   apixaban  (ELIQUIS ) 5 MG TABS tablet Take 1 tablet (5 mg total) by mouth 2 (two) times daily. 180 tablet 1   atorvastatin  (LIPITOR ) 80 MG tablet Take 1 tablet (80 mg total) by mouth daily. 90 tablet 1   Blood Glucose Monitoring Suppl DEVI 1 each by  Does not apply route in the morning, at noon, and at bedtime. May substitute to any manufacturer covered by patient's insurance. 1 each 0   Continuous Glucose Receiver (FREESTYLE LIBRE 3 READER) DEVI 1 Device by Does not apply route continuous. 2 each 11   Continuous Glucose Sensor (FREESTYLE LIBRE 3 SENSOR) MISC 1 Device by Does not apply route every 14 (fourteen) days. 6 each 1   eplerenone  (INSPRA ) 25 MG tablet Take 1 tablet (25 mg total) by mouth daily. 90 tablet 1   ezetimibe  (ZETIA ) 10 MG tablet Take 1 tablet (10 mg total) by mouth daily. 30 tablet 10  folic acid  (FOLVITE ) 1 MG tablet Take 1 tablet (1 mg total) by mouth daily. 90 tablet 0   Glucose Blood (BLOOD GLUCOSE TEST STRIPS) STRP 1 each by In Vitro route in the morning, at noon, and at bedtime. May substitute to any manufacturer covered by patient's insurance. 100 strip 11   insulin  glargine (LANTUS  SOLOSTAR) 100 UNIT/ML Solostar Pen Inject 20 Units into the skin at bedtime. 6 mL 0   insulin  lispro (HUMALOG ) 100 UNIT/ML injection Inject 0.01 mLs (1 Units total) into the skin 3 (three) times daily with meals. Sliding scale 70-140=0 Units 141-170=1 Units 171-200=2 Units 201-230=3 Units 231-260=4 Units 261-290=5 Units 291-320=6 Units 231-350=7 Units 351-380=8 Units Over 380 10 units 2.7 mL 0   Insulin  Pen Needle 32G X 4 MM MISC Use as directed with Lantus . 100 each 0   isosorbide  mononitrate (IMDUR ) 120 MG 24 hr tablet Take 1 tablet (120 mg total) by mouth daily. 90 tablet 1   JARDIANCE  25 MG TABS tablet TAKE 1 TABLET BY MOUTH ONCE DAILY.* CONTACT OFFICE FOR APPOINTMENT* 30 tablet 10   Lancets Misc. MISC 1 each by Does not apply route in the morning, at noon, and at bedtime. May substitute to any manufacturer covered by patient's insurance. 100 each 11   losartan  (COZAAR ) 50 MG tablet Take 1 tablet (50 mg total) by mouth daily. 90 tablet 3   metoprolol  succinate (TOPROL -XL) 25 MG 24 hr tablet Take 0.5 tablets (12.5 mg total) by mouth daily.  45 tablet 3   Multiple Vitamins-Minerals (MULTIVITAMIN WITH MINERALS) tablet Take 1 tablet by mouth daily. 90 tablet 0   nitroGLYCERIN  (NITROSTAT ) 0.4 MG SL tablet DISSOLVE 1 TABLET UNDER THE TONGUE AS NEEDED FOR CHEST PAIN EVERY 5 MINUTES UP TO 3 TIMES. IF NO RELIEF CALL 911. 25 tablet 11   thiamine  (VITAMIN B1) 100 MG tablet Take 1 tablet (100 mg total) by mouth daily. 90 tablet 0   torsemide  (DEMADEX ) 20 MG tablet Take 2 tablets (40 mg total) by mouth daily as needed (as needed for weight gain >2 lbs in 24 hrs or > 5 lbs in a week , increased leg swelling, increased shortness of breath. May take another dose if symptoms persist and contact provider). 180 tablet 0   No current facility-administered medications for this encounter.   BP 134/80   Pulse 83   Ht 5' 9 (1.753 m)   Wt 88.5 kg (195 lb)   SpO2 95%   BMI 28.80 kg/m    Wt Readings from Last 3 Encounters:  08/11/24 88.5 kg (195 lb)  07/21/24 88.1 kg (194 lb 3.2 oz)  07/20/24 88.9 kg (196 lb)   PHYSICAL EXAM: General:  NAD. No resp difficulty, walked into clinic HEENT: Normal Neck: Supple. No JVD. Cor: Regular rate & rhythm. No rubs, gallops or murmurs. Lungs: Clear Abdomen: Soft, nontender, nondistended.  Extremities: No cyanosis, clubbing, rash, edema Neuro: Alert & oriented x 3, moves all 4 extremities w/o difficulty. Affect pleasant.  ASSESSMENT/PLAN 1. Chronic diastolic CHF: EF 52% on cardiac MRI in 6/14 but EF improved back to 55-60% on 2/16 echo.  However, EF down to 26% on Cardiolite  in 9/16.  Echo in 9/20 showed EF 60-65%, moderate LVH, normal RV, mild-moderate MR.  Echo in 5/21 with EF 55%, normal RV.  He has a Cardiomems device, but has not used in months.  Echo in 6/23 showed EF 55-60% with severe asymmetric septal hypertrophy.  Cardiac MRI (10/23) showed LVEF 47%, no LVOT, moderate to  severe asymmetric hypertrophy of the basal to mid inferoseptal and anteroseptum, RVEF 46%, ECV 34%, non-coronary LGE pattern most  consistent with hypertrophic CM. 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  LMNA mutations can cause a HCM phenotype.  Echo (1/25) with EF 60-65%, severe LVH, normal RV.  Echo (10/25) EF 55-60%, mild LVH, normal RV. NYHA class II symptoms, he is not volume overloaded. - Continue losartan  50 mg daily. Recent labs reviewed and are stable, K 4.3, Scr 1.48 - Continue eplerenone  25 mg daily.  - Continue torsemide  40 mg PRN   - Continue empagliflozin  25 mg daily.   - Increase Toprol  XL to 25 mg daily (see below) - He has been referred again to genetic evaluation with Dr. Danford Pac given LMNA mutation that may be associated with HCM phenotype. We have tried several times to set up this appointment but to no avail.  - Needs to use Cardiomems. 2. Atrial fibrillation/flutter: Paroxysmal. He has had atrial flutter ablations x 2 remotely.  He was on sotalol . He previously tolerated atrial arrhythmias poorly, seems to worsen CHF.  Zio patch in 4/21 with 20% atrial flutter. He saw EP, repeat ablation was not recommended. Repeat Zio in 9/22 showed only NSR and PACs.  Sotalol  stopped due to poor compliance.  He was back in AFL at 1/25 follow up, off sotalol . Underwent TEE guided DCCV 2/25 to NSR. He remained on amiodarone . However, he was lost to follow up and is now off amiodarone , and in rate controlled AFL at last visit 07/13/24. Per chart review, looks like he was back out of rhythm 11/24/23 when he presented to ED after a fall after drinking ETOH. He is rate controlled today - Increase Toprol  XL to 25 mg daily. - Continue Eliquis  5 mg bid. No bleeding issues. Recent CBC stable - He feels well and is unsure if he wants more procedures. I discussed that he has historically not tolerated atrial arrhythmias well in the past and there is a possibility his EF become reduced.  He has been referred to EP to discuss re-do AFL ablation, he is agreeable to  discussing with EP. - Needs CPAP (see below) 3. Coronary artery disease: LHC in 9/16 showed diffuse CAD, most significant involving the distal RCA.  Patient had DES x 2 to RCA.  Cath in 3/17 with patent RCA stents and diffuse distal and branch vessel disease concerning for poorly controlled diabetes.  Cath in 5/18 again showed diffuse CAD without good interventional target.  Cath in 11/20 showed severe distal and branch vessel disease but no interventional targets and not good anatomy for CABG. No ischemic CP - No ASA, given Eliquis  use.  - Continue atorvastatin  80 mg daily. Recent LDL 65 (10/25) - Continue beta blocker and Imdur  120 mg daily for angina control.   4. Hyperlipidemia: Continue atorvastatin  as above.  5. Hypertension: BP improving. - Continue amlodipine  10 mg daily. 6. OSA: He cannot tolerate CPAP.  - With recurrent atrial arrhythmias, he has been referred to Sleep Medicine for management of OSA, they are ordering nasal pillow and chin strap 7. CKD 3: Baseline SCr 1.4. - Continue Jardiance . Most recent SCr 1.48 8. DM: He is on insulin . A1C 8.4 - Followup with endocrinology.  9. ETOH use: None x 2 months. - Congratulated and encouraged continued abstinence. 10. Non-compliance: Chronic problem. Doing well now that he is living with daughter, who manages his medications. - If/when he  moves back into his own home, will need to consider restarting paramedicine to help with compliance.  11. TIA: TIA in 1/25, probably due to noncompliance with Eliquis .  - Followed by Dr. Rosemarie  Follow up in 3-4 months with Dr. Rolan.  Harlene HERO [facility] FNP-BC 08/11/2024 "

## 2024-08-11 NOTE — Telephone Encounter (Signed)
 Requested Prescriptions   Pending Prescriptions Disp Refills   JARDIANCE  25 MG TABS tablet [Pharmacy Med Name: JARDIANCE  25 MG TABS 25 Tablet] 30 tablet 10    Sig: TAKE 1 TABLET BY MOUTH ONCE DAILY.* CONTACT OFFICE FOR APPOINTMENT*

## 2024-08-17 ENCOUNTER — Encounter: Payer: Self-pay | Admitting: Adult Health

## 2024-08-31 ENCOUNTER — Other Ambulatory Visit (INDEPENDENT_AMBULATORY_CARE_PROVIDER_SITE_OTHER)

## 2024-08-31 ENCOUNTER — Telehealth: Payer: Self-pay | Admitting: Adult Health

## 2024-08-31 DIAGNOSIS — I10 Essential (primary) hypertension: Secondary | ICD-10-CM

## 2024-08-31 DIAGNOSIS — E1165 Type 2 diabetes mellitus with hyperglycemia: Secondary | ICD-10-CM

## 2024-08-31 DIAGNOSIS — Z794 Long term (current) use of insulin: Secondary | ICD-10-CM

## 2024-08-31 NOTE — Telephone Encounter (Signed)
 Patient's daughter, Montrelle Eddings ( on last DPR) Would like to schedule an earlier appointment than September to determine if patient can go home to live by his self and whether or not patient can drive by his self. Want to know due to patient wants to live by his self and drive by his self.

## 2024-08-31 NOTE — Progress Notes (Signed)
 "  08/31/2024 Name: [name] [name] MRN: [id number] DOB: January 15, 1956  Chief Complaint  Patient presents with   Medication Management   Diabetes   Hypertension    [name] [name] is a 69 y.o. year old male who presented for a telephone visit.   They were referred to the pharmacist by their PCP for assistance in managing diabetes and complex medication management.    Subjective:  Care Team: Primary Care Provider: Mercer Clotilda SAUNDERS, MD   Medication Access/Adherence  Current Pharmacy:  [facility] [address]), [place] - [address] [place] ([place] [address] Phone: [phone] Fax: [phone]  [facility] [place]  - [place] - [address] [place] [address] Phone: [phone] Fax: [phone]   Patient reports affordability concerns with their medications: No  Patient reports access/transportation concerns to their pharmacy: No  Patient reports adherence concerns with their medications:  No     Diabetes:  Current medications: Jardiance  25mg , Metformin  (self-stopped), Lantus  20 units into skin daily, Humalog  TID ss  Inject 1 Units into the skin 3 (three) times daily with meals. Sliding scale 70-140=0 Units 141-170=1 Units 171-200=2 Units 201-230=3 Units 231-260=4 Units 261-290=5 Units 291-320=6 Units 231-350=7 Units 351-380=8 Units Over 380 10 units   Medications tried in the past: Tresiba   Current glucose readings:    Observed patterns:  Patient denies hypoglycemic s/sx including dizziness, shakiness, sweating. Patient denies hyperglycemic symptoms including polyuria, polydipsia, polyphagia, nocturia, neuropathy, blurred vision.    Macrovascular and Microvascular Risk Reduction:  Statin? yes (Atorvastatin  80mg ); ACEi/ARB? yes (Losartan  50mg ) Last urinary albumin/creatinine ratio:  Lab Results  Component Value Date   MICRALBCREAT 636.3 (H) 06/24/2024    MICRALBCREAT 7 01/26/2016   Last eye exam:  Lab Results  Component Value Date   HMDIABEYEEXA No Retinopathy 10/21/2020   Last foot exam: 07/01/2024 Tobacco Use:  Tobacco Use: Medium Risk (08/11/2024)   Patient History    Smoking Tobacco Use: Former    Smokeless Tobacco Use: Never    Passive Exposure: Not on file   Hypertension:  Current medications: Amlodipine  10mg  daily, Losartan  50mg  daily, Metoprolol  25mg  1/2 tab daily  Patient has a validated, automated, upper arm home BP cuff Current blood pressure readings readings: denies any readings >140/90  Patient denies hypotensive s/sx including dizziness, lightheadedness.  Patient denies hypertensive symptoms including headache, chest pain, shortness of breath    Objective:  Lab Results  Component Value Date   HGBA1C 8.4 (H) 06/04/2024    Lab Results  Component Value Date   CREATININE 1.48 (H) 07/17/2024   BUN 26 (H) 07/17/2024   NA 136 07/17/2024   K 4.3 07/17/2024   CL 101 07/17/2024   CO2 25 07/17/2024    Lab Results  Component Value Date   CHOL 123 06/07/2024   HDL 40 (L) 06/07/2024   LDLCALC 65 06/07/2024   LDLDIRECT 111.0 04/22/2015   TRIG 88 06/07/2024   CHOLHDL 3.1 06/07/2024    Medications Reviewed Today     Reviewed by Lionell Jon DEL, RPH (Pharmacist) on 08/31/24 at 1519  Med List Status: <None>   Medication Order Taking? Sig Documenting Provider Last Dose Status Informant  acetaminophen  (TYLENOL ) 500 MG tablet [id number]  Take 1,000 mg by mouth every 6 (six) hours as needed (pain). [provider]  Active Self  amLODipine  (NORVASC ) 10 MG tablet [id number] Yes Take 1 tablet (10 mg total) by mouth daily. Mercer Clotilda SAUNDERS,  MD  Active   apixaban  (ELIQUIS ) 5 MG TABS tablet [id number] Yes Take 1 tablet (5 mg total) by mouth 2 (two) times daily. Rolan Ezra RAMAN, MD  Active   atorvastatin  (LIPITOR ) 80 MG tablet [id number] Yes Take 1 tablet (80 mg total) by mouth daily. Rolan Ezra RAMAN, MD   Active   Blood Glucose Monitoring Suppl DEVI [id number] Yes 1 each by Does not apply route in the morning, at noon, and at bedtime. May substitute to any manufacturer covered by patient's insurance. Mercer Clotilda SAUNDERS, MD  Active Self  Continuous Glucose Receiver (FREESTYLE LIBRE 3 READER) DEVI [id number]  1 Device by Does not apply route continuous. Mercer Clotilda SAUNDERS, MD  Active Self  Continuous Glucose Sensor (FREESTYLE LIBRE 3 SENSOR) [place] [id number]  1 Device by Does not apply route every 14 (fourteen) days. Motwani, Komal, MD  Active Self  empagliflozin  (JARDIANCE ) 25 MG TABS tablet [id number] Yes Take 1 tablet (25 mg total) by mouth daily. [place], [place], [place]  Active   eplerenone  (INSPRA ) 25 MG tablet [id number] Yes Take 1 tablet (25 mg total) by mouth daily. Rolan Ezra RAMAN, MD  Active   ezetimibe  (ZETIA ) 10 MG tablet [id number] Yes Take 1 tablet (10 mg total) by mouth daily. Rolan Ezra RAMAN, MD  Active   folic acid  (FOLVITE ) 1 MG tablet [id number] Yes Take 1 tablet (1 mg total) by mouth daily. Mercer Clotilda SAUNDERS, MD  Active   Glucose Blood (BLOOD GLUCOSE TEST STRIPS) STRP [id number]  1 each by In Vitro route in the morning, at noon, and at bedtime. May substitute to any manufacturer covered by patient's insurance. Mercer Clotilda SAUNDERS, MD  Active Self  insulin  glargine (LANTUS  SOLOSTAR) 100 UNIT/ML Solostar Pen [id number] Yes Inject 20 Units into the skin at bedtime. Motwani, Komal, MD  Active   insulin  lispro (HUMALOG ) 100 UNIT/ML injection [id number] Yes Inject 0.01 mLs (1 Units total) into the skin 3 (three) times daily with meals. Sliding scale 70-140=0 Units 141-170=1 Units 171-200=2 Units 201-230=3 Units 231-260=4 Units 261-290=5 Units 291-320=6 Units 231-350=7 Units 351-380=8 Units Over 380 10 units Motwani, Komal, MD  Active   Insulin  Pen Needle 32G X 4 MM MISC [id number]  Use as directed with Lantus . Al-Sultani, Anmar, MD  Active   isosorbide  mononitrate (IMDUR ) 120 MG 24 hr tablet [id number] Yes Take  1 tablet (120 mg total) by mouth daily. Rolan Ezra RAMAN, MD  Active   Lancets Misc. MISC [id number]  1 each by Does not apply route in the morning, at noon, and at bedtime. May substitute to any manufacturer covered by patient's insurance. Mercer Clotilda SAUNDERS, MD  Active Self  losartan  (COZAAR ) 50 MG tablet [id number] Yes Take 1 tablet (50 mg total) by mouth daily. Mercer Clotilda SAUNDERS, MD  Active   metoprolol  succinate (TOPROL -XL) 25 MG 24 hr tablet [id number] Yes Take 1 tablet (25 mg total) by mouth daily. [place], [place], [place]  Active   Multiple Vitamins-Minerals (MULTIVITAMIN WITH MINERALS) tablet [id number] Yes Take 1 tablet by mouth daily. Mercer Clotilda SAUNDERS, MD  Active   nitroGLYCERIN  (NITROSTAT ) 0.4 MG SL tablet [id number]  DISSOLVE 1 TABLET UNDER THE TONGUE AS NEEDED FOR CHEST PAIN EVERY 5 MINUTES UP TO 3 TIMES. IF NO RELIEF CALL 911. Bensimhon, Toribio SAUNDERS, MD  Active   thiamine  (VITAMIN B1) 100 MG tablet [id number] Yes Take 1 tablet (100 mg total) by mouth daily. Mercer Clotilda SAUNDERS, MD  Active   torsemide  (DEMADEX ) 20 MG tablet [id number] Yes Take  2 tablets (40 mg total) by mouth daily as needed (as needed for weight gain >2 lbs in 24 hrs or > 5 lbs in a week , increased leg swelling, increased shortness of breath. May take another dose if symptoms persist and contact provider). Mercer Clotilda SAUNDERS, MD  Active               Assessment/Plan:   Diabetes: - Currently uncontrolled; goal A1c <7%. Cardiorenal risk reduction is opportunities for improvement.. Blood pressure is not at goal <130/80. LDL is at goal.  - Reviewed goal A1c, goal fasting, and goal 2 hour post prandial glucose. Recommended to check glucose continuously with sensor, connected to office portal. - Recommend to continue current medication therapy for now. Removed metformin  from list as patient reports he has not taken in some time due to causing upset stomach. -Consider insulin  dose increase, will coordinate with Dr. Dartha to address  elevated BG  Hypertension: - Currently controlled - Reviewed long term cardiovascular and renal outcomes of uncontrolled blood pressure - Reviewed appropriate blood pressure monitoring technique and reviewed goal blood pressure. Recommended to check home blood pressure and heart rate daily - Recommend to continue current med therapy. Consider dose increase on losartan  if BP elevates   Follow Up Plan: 8 weeks (sees PCP in 4 weeks)  Jon VEAR [place], PharmD Clinical Pharmacist [phone]  "

## 2024-08-31 NOTE — Telephone Encounter (Signed)
 Per last office note, patient is to return 3 months after, which is middle March.

## 2024-09-01 NOTE — Telephone Encounter (Signed)
 Patient's daughter is requesting determination on whether patient can live by himself or not, as well as being able to drive.

## 2024-09-02 ENCOUNTER — Telehealth: Payer: Self-pay

## 2024-09-02 NOTE — Telephone Encounter (Signed)
 LVM for daughter garland (on dpr) to call back and discuss sooner ov in March 2026

## 2024-09-02 NOTE — Telephone Encounter (Signed)
 Arranged consult with Dr. Kennyth for 10/14/24 at 9 AM. Patient verbalized understanding and grateful for call.

## 2024-09-04 ENCOUNTER — Encounter: Payer: Self-pay | Admitting: Family Medicine

## 2024-09-04 ENCOUNTER — Ambulatory Visit: Admitting: Family Medicine

## 2024-09-04 VITALS — BP 148/72 | HR 59 | Temp 97.8°F | Ht 69.0 in | Wt 200.2 lb

## 2024-09-04 DIAGNOSIS — L309 Dermatitis, unspecified: Secondary | ICD-10-CM

## 2024-09-04 DIAGNOSIS — B181 Chronic viral hepatitis B without delta-agent: Secondary | ICD-10-CM

## 2024-09-04 MED ORDER — TRIAMCINOLONE ACETONIDE 0.1 % EX CREA: 1.0000 | TOPICAL_CREAM | Freq: Two times a day (BID) | CUTANEOUS | 0 refills | Status: AC

## 2024-09-04 NOTE — Progress Notes (Signed)
 "  Established Patient Office Visit   Subjective  Patient ID: [name] [name], male    DOB: November 07, 1955  Age: 69 y.o. MRN: [id number]  Chief Complaint  Patient presents with   Acute Visit    Rash started 4 days ago, abdominal, arm, back, Itchy burning,     Pt is a 69 yo male seen for acute concern.  Pt with pruritic rash on arms, back, chest, abdomen x 4 days.  Rash also burns and skin appears dry.  Patient did not see the rash when it originally started.  Denies having blisters or other bumps on skin.  Patient has been using rubbing alcohol on skin.  Does not use lotion.  Takes very hot showers.  Denies changes in soaps, lotions, detergents.  When asked about liver dz.  Pt states he was unaware of this.  Has a h/o chronic hep B.      Patient Active Problem List   Diagnosis Date Noted   Acute arterial ischemic stroke, multifocal, multiple vascular territories (HCC) 06/12/2024   Right-sided headache 06/12/2024   (HFpEF) heart failure with preserved ejection fraction (HCC) 06/12/2024   Chronic alcohol abuse 06/12/2024   Wrist fracture, left, closed, initial encounter 06/05/2022   Rib pain 06/05/2022   Mobitz (type) I [facility]) atrioventricular block 01/23/2022   Chest pain of uncertain etiology 01/22/2022   Syncope 02/09/2020   Generalized weakness 06/27/2019   Acute on chronic congestive heart failure with left ventricular diastolic dysfunction (HCC) 06/26/2019   Anemia due to chronic kidney disease 06/26/2019   CKD (chronic kidney disease), stage III (HCC) 06/26/2019   Type 2 diabetes mellitus without complications (HCC) 06/26/2019   Iron deficiency anemia due to chronic blood loss 06/26/2019   History of atrial fibrillation    History of type 2 diabetes mellitus    Anemia    Chest pain 04/24/2019   Overweight (BMI 25.0-29.9) 02/28/2017   Unstable angina (HCC) 08/03/2016   S/P laparoscopic cholecystectomy 02/07/2016   Paroxysmal A-fib (HCC) 01/22/2016   Coronary  artery disease involving native coronary artery with unstable angina pectoris (HCC) 12/29/2015   Chronic anticoagulation 08/22/2015   Atrial flutter with controlled response (HCC) 08/22/2015   AKI (acute kidney injury)    RUQ pain    Cholelithiases- drain in place 06/04/2015   Primary hypertension    Chronic diastolic CHF (congestive heart failure) (HCC) 04/24/2015   Excessive daytime sleepiness 11/25/2014   Obstructive sleep apnea 11/25/2014   NICM (nonischemic cardiomyopathy) (HCC) 03/11/2014   Anticoagulation goal of INR 2 to 3 10/19/2013   Eczema 10/19/2013   Low libido 10/16/2013   Persistent atrial fibrillation (HCC) 10/06/2013   Rectal bleeding 12/18/2011   HYPERCHOLESTEROLEMIA 07/11/2010   DENTAL CARIES 06/30/2010   GOUT 05/05/2009   HEPATITIS B, CHRONIC 12/30/2006   OSTEOCHONDROMA 12/30/2006   Mitral valve disorder 12/30/2006   LIVER FUNCTION TESTS, ABNORMAL, HX OF 12/30/2006   History of colonic polyps 12/30/2006   COLONOSCOPY, HX OF 05/12/2001   Past Medical History:  Diagnosis Date   Anxiety    CAD (coronary artery disease), native coronary artery    a. Nonobstructive CAD by cath 2013 - diffuse distal and branch vessel CAD, no severe disease in the major coronaries, LV mild global hypokinesis, EF 45%. b. ETT-Sestamibi 5/14: EF 31%, small fixed inferior defect with no ischemia.   Chronic CHF (HCC)    a. Mixed ICM/NICM (?EtOH). EF 35% in 2008. Echo 5/13: EF 60-65%, mod LVH, EF 45% on V gram in  12/2011. EF 12/2012: EF 50-55%, mild LVH, inferobasal HK, mild MR. ETT-Ses 5/14 EF 41%. Cardiac MRI 5/14: EF 44%, mild global HK, subepicardial delayed enhancement in nonspecific RV insertion pattern.   COLONIC POLYPS, HX OF 12/30/2006   Gout    H/O atrial flutter 08/13/2005   a. Ablations in 2007, 2008.   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   left shoulder   PAF (paroxysmal atrial fibrillation) (HCC)    On coumadin    Sleep apnea    suppose to send mask but they never did (05/03/2015)   Type II diabetes mellitus (HCC)    Past Surgical History:  Procedure Laterality Date   A FLUTTER ABLATION  2007, 2008   catheter ablation    CARDIAC CATHETERIZATION N/A 05/03/2015   Procedure: Right/Left Heart Cath and Coronary Angiography;  Surgeon: Ezra GORMAN Shuck, MD;  Location: [facility] INVASIVE CV LAB;  Service: Cardiovascular;  Laterality: N/A;   CARDIAC CATHETERIZATION N/A 05/03/2015   Procedure: Coronary Stent Intervention;  Surgeon: Candyce GORMAN Reek, MD;  Location: [facility] INVASIVE CV LAB;  Service: Cardiovascular;  Laterality: N/A;   CARDIAC CATHETERIZATION N/A 11/04/2015   Procedure: Left Heart Cath and Coronary Angiography;  Surgeon: Ezra GORMAN Shuck, MD;  Location: [facility] INVASIVE CV LAB;  Service: Cardiovascular;  Laterality: N/A;   CARDIOVERSION N/A 08/24/2015   Procedure: CARDIOVERSION;  Surgeon: Aleene JINNY Passe, MD;  Location: [facility] ENDOSCOPY;  Service: Cardiovascular;  Laterality: N/A;   CARDIOVERSION N/A 12/14/2022   Procedure: CARDIOVERSION;  Surgeon: Shuck Ezra GORMAN, MD;  Location: [facility] INVASIVE CV LAB;  Service: Cardiovascular;  Laterality: N/A;   CARDIOVERSION N/A 10/02/2023   Procedure: CARDIOVERSION;  Surgeon: Shuck Ezra GORMAN, MD;  Location: [facility] INVASIVE CV LAB;  Service: Cardiovascular;  Laterality: N/A;   CHOLECYSTECTOMY N/A 02/07/2016   Procedure: LAPAROSCOPIC CHOLECYSTECTOMY WITH  INTRAOPERATIVE CHOLANGIOGRAM;  Surgeon: Camellia Blush, MD;  Location: [facility] ORS;  Service: General;  Laterality: N/A;   COLONOSCOPY WITH PROPOFOL  N/A 04/28/2019   Procedure: COLONOSCOPY WITH PROPOFOL ;  Surgeon: Saintclair Jasper, MD;  Location: [facility] ENDOSCOPY;  Service: Gastroenterology;   Laterality: N/A;   CORONARY ANGIOPLASTY  12/05/01   ESOPHAGOGASTRODUODENOSCOPY (EGD) WITH PROPOFOL  N/A 04/27/2019   Procedure: ESOPHAGOGASTRODUODENOSCOPY (EGD) WITH PROPOFOL ;  Surgeon: Saintclair Jasper, MD;  Location: [facility] ENDOSCOPY;  Service: Gastroenterology;  Laterality: N/A;   GIVENS CAPSULE STUDY N/A 06/29/2019   Procedure: GIVENS CAPSULE STUDY;  Surgeon: Dianna Specking, MD;  Location: [facility] ENDOSCOPY;  Service: Endoscopy;  Laterality: N/A;   LEFT HEART CATH AND CORONARY ANGIOGRAPHY N/A 12/28/2016   Procedure: Left Heart Cath and Coronary Angiography;  Surgeon: Shuck Ezra GORMAN, MD;  Location: [facility] INVASIVE CV LAB;  Service: Cardiovascular;  Laterality: N/A;   LEFT HEART CATHETERIZATION WITH CORONARY ANGIOGRAM N/A 12/19/2011   Procedure: LEFT HEART CATHETERIZATION WITH CORONARY ANGIOGRAM;  Surgeon: Ezra GORMAN Shuck, MD;  Location: [facility] CATH LAB;  Service: Cardiovascular;  Laterality: N/A;   OSTEOCHONDROMA EXCISION Left 1972   took bone tumor off my shoulder   POLYPECTOMY  04/28/2019   Procedure: POLYPECTOMY;  Surgeon: Saintclair Jasper, MD;  Location: [facility] ENDOSCOPY;  Service: Gastroenterology;;   PRESSURE SENSOR/CARDIOMEMS N/A 02/01/2020   Procedure: PRESSURE SENSOR/CARDIOMEMS;  Surgeon: Shuck Ezra  S, MD;  Location: [facility] INVASIVE CV LAB;  Service: Cardiovascular;  Laterality: N/A;   RIGHT HEART CATH N/A 02/01/2020   Procedure: RIGHT HEART CATH;  Surgeon: Rolan Ezra RAMAN, MD;  Location: [facility] INVASIVE CV LAB;  Service: Cardiovascular;  Laterality: N/A;   RIGHT/LEFT HEART CATH AND CORONARY ANGIOGRAPHY N/A 07/01/2019   Procedure: RIGHT/LEFT HEART CATH AND CORONARY ANGIOGRAPHY;  Surgeon: Rolan Ezra RAMAN, MD;  Location: [facility] INVASIVE CV LAB;  Service: Cardiovascular;  Laterality: N/A;   TEE WITHOUT CARDIOVERSION N/A 12/14/2022   Procedure: TRANSESOPHAGEAL ECHOCARDIOGRAM;  Surgeon: Rolan Ezra RAMAN, MD;  Location: [facility] INVASIVE CV LAB;  Service: Cardiovascular;  Laterality: N/A;   TRANSESOPHAGEAL ECHOCARDIOGRAM (CATH LAB)  N/A 10/02/2023   Procedure: TRANSESOPHAGEAL ECHOCARDIOGRAM;  Surgeon: Rolan Ezra RAMAN, MD;  Location: [facility] INVASIVE CV LAB;  Service: Cardiovascular;  Laterality: N/A;   Social History[1] Family History  Problem Relation Age of Onset   Diabetes Mother    Hypertension Mother    Heart attack Neg Hx    Stroke Neg Hx    Allergies[2]  ROS Negative unless stated above    Objective:     BP (!) 148/72 (BP Location: Left Arm, Patient Position: Sitting, Cuff Size: Large)   Pulse (!) 59   Temp 97.8 F (36.6 C) (Oral)   Ht 5' 9 (1.753 m)   Wt 200 lb 3.2 oz (90.8 kg)   SpO2 99%   BMI 29.56 kg/m  BP Readings from Last 3 Encounters:  09/04/24 (!) 148/72  08/11/24 134/80  07/21/24 118/70   Wt Readings from Last 3 Encounters:  09/04/24 200 lb 3.2 oz (90.8 kg)  08/11/24 195 lb (88.5 kg)  07/21/24 194 lb 3.2 oz (88.1 kg)      Physical Exam Constitutional:      General: He is not in acute distress.    Appearance: Normal appearance.  HENT:     Head: Normocephalic and atraumatic.     Nose: Nose normal.     Mouth/Throat:     Mouth: Mucous membranes are moist.  Eyes:     Comments: No jaundice.  Cardiovascular:     Rate and Rhythm: Normal rate and regular rhythm.     Heart sounds: Normal heart sounds. No murmur heard.    No gallop.  Pulmonary:     Effort: Pulmonary effort is normal. No respiratory distress.     Breath sounds: Normal breath sounds. No wheezing, rhonchi or rales.  Skin:    General: Skin is warm and dry.     Comments: Extremely dry skin on back, UEs, abd.  Excoriations, scaling of R abd.  No vesicles, pustules, erythema, induration or drainage.  Neurological:     Mental Status: He is alert and oriented to person, place, and time.        09/04/2024    4:10 PM 06/24/2024    2:30 PM 01/02/2024    9:54 AM  Depression screen PHQ 2/9  Decreased Interest 0 0 0  Down, Depressed, Hopeless 0 0 0  PHQ - 2 Score 0 0 0  Altered sleeping 0 0 1  Tired, decreased energy  0 2 1  Change in appetite 0 0 2  Feeling bad or failure about yourself  0 1 1  Trouble concentrating 0 0 1  Moving slowly or fidgety/restless 0  0  Suicidal thoughts 0 0 0  PHQ-9 Score 0 3 6   Difficult doing work/chores Not difficult at all Somewhat difficult Not difficult at all  Data saved with a previous flowsheet row definition      09/04/2024    4:10 PM 06/24/2024    2:30 PM 01/02/2024    9:54 AM 12/09/2023    5:02 PM  GAD 7 : Generalized Anxiety Score  Nervous, Anxious, on Edge 0 0  1  0   Control/stop worrying 0 1  0  0   Worry too much - different things 0 1  1  0   Trouble relaxing 0 0  0  0   Restless 0 0  0  0   Easily annoyed or irritable 0 2  0  0   Afraid - awful might happen 0 0  1  0   Total GAD 7 Score 0 4 3 0  Anxiety Difficulty  Not difficult at all Not difficult at all Not difficult at all     Data saved with a previous flowsheet row definition     No results found for any visits on 09/04/24.    Assessment & Plan:   Eczema, unspecified type -     Triamcinolone Acetonide; Apply 1 Application topically 2 (two) times daily.  Dispense: 45 g; Refill: 0  HEPATITIS B, CHRONIC   Extremely dry skin concerning for eczema flare.  Also consider worsening liver disease given history of chronic hep B.  No jaundice noted.  Patient advised to use a gentle moisturizer daily, avoid taking hot showers.  Advised to stop using alcohol on skin.  Steroid cream as needed.  Given handout.  Return if symptoms worsen or fail to improve.   Clotilda JONELLE Single, MD     [1]  Social History Tobacco Use   Smoking status: Former    Current packs/day: 0.00    Average packs/day: 4.0 packs/day for 18.0 years (72.0 ttl pk-yrs)    Types: Cigarettes    Start date: 08/13/1965    Quit date: 08/14/1983    Years since quitting: 41.0   Smokeless tobacco: Never  Vaping Use   Vaping status: Never Used  Substance Use Topics   Alcohol use: Yes    Alcohol/week: 16.0 standard drinks of  alcohol    Types: 6 Cans of beer, 10 Shots of liquor per week    Comment: 2-3 beers watching sports; occasional glass of liquor; drinks heavily in football season   Drug use: Yes    Types: Marijuana    Comment: twice a week  [2]  Allergies Allergen Reactions   Shrimp [Shellfish Allergy] Nausea And Vomiting   Ace Inhibitors Cough   Other Hives and Other (See Comments)    Patient reports developing hives after receiving some antibiotic given in 1980''s at [facility]. He does not know which antibiotic.   "

## 2024-09-14 ENCOUNTER — Other Ambulatory Visit (HOSPITAL_COMMUNITY): Payer: Self-pay | Admitting: Family Medicine

## 2024-09-14 DIAGNOSIS — I5032 Chronic diastolic (congestive) heart failure: Secondary | ICD-10-CM

## 2024-09-16 ENCOUNTER — Ambulatory Visit: Admitting: Adult Health

## 2024-09-21 ENCOUNTER — Other Ambulatory Visit

## 2024-09-28 ENCOUNTER — Ambulatory Visit: Admitting: Family Medicine

## 2024-10-01 ENCOUNTER — Ambulatory Visit: Admitting: Adult Health

## 2024-10-14 ENCOUNTER — Ambulatory Visit: Admitting: Cardiology

## 2024-10-19 ENCOUNTER — Other Ambulatory Visit

## 2024-12-29 ENCOUNTER — Ambulatory Visit: Admitting: "Endocrinology

## 2025-05-11 ENCOUNTER — Ambulatory Visit: Admitting: Adult Health
